# Patient Record
Sex: Female | Born: 1980 | Hispanic: Yes | Marital: Married
Health system: Midwestern US, Community
[De-identification: ages and names within clinical notes are randomized; demographics above are authoritative.]

## PROBLEM LIST (undated history)

## (undated) DIAGNOSIS — R109 Unspecified abdominal pain: Principal | ICD-10-CM

## (undated) DIAGNOSIS — G8929 Other chronic pain: Secondary | ICD-10-CM

## (undated) DIAGNOSIS — N3000 Acute cystitis without hematuria: Secondary | ICD-10-CM

## (undated) DIAGNOSIS — Z0000 Encounter for general adult medical examination without abnormal findings: Secondary | ICD-10-CM

## (undated) DIAGNOSIS — K3184 Gastroparesis: Principal | ICD-10-CM

## (undated) DIAGNOSIS — I120 Hypertensive chronic kidney disease with stage 5 chronic kidney disease or end stage renal disease: Secondary | ICD-10-CM

## (undated) DIAGNOSIS — F329 Major depressive disorder, single episode, unspecified: Secondary | ICD-10-CM

## (undated) DIAGNOSIS — E7800 Pure hypercholesterolemia, unspecified: Secondary | ICD-10-CM

## (undated) DIAGNOSIS — F32A Depression, unspecified: Secondary | ICD-10-CM

## (undated) DIAGNOSIS — E119 Type 2 diabetes mellitus without complications: Secondary | ICD-10-CM

## (undated) DIAGNOSIS — I10 Essential (primary) hypertension: Secondary | ICD-10-CM

## (undated) DIAGNOSIS — M109 Gout, unspecified: Secondary | ICD-10-CM

## (undated) DIAGNOSIS — E785 Hyperlipidemia, unspecified: Secondary | ICD-10-CM

## (undated) DIAGNOSIS — R519 Headache, unspecified: Secondary | ICD-10-CM

## (undated) DIAGNOSIS — H539 Unspecified visual disturbance: Secondary | ICD-10-CM

---

## 2003-01-12 ENCOUNTER — Emergency Department: Admit: 2003-01-12 | Payer: Self-pay | Source: Emergency Department | Admitting: Emergency Medicine

## 2003-11-27 ENCOUNTER — Inpatient Hospital Stay (INDEPENDENT_AMBULATORY_CARE_PROVIDER_SITE_OTHER): Admit: 2003-11-27 | Disposition: A | Discharge status: Home / Self Care | Payer: Self-pay | Source: Ambulatory Visit

## 2004-03-04 ENCOUNTER — Inpatient Hospital Stay (HOSPITAL_BASED_OUTPATIENT_CLINIC_OR_DEPARTMENT_OTHER)
Admission: RE | Admit: 2004-03-04 | Disposition: A | Discharge status: Home / Self Care | Payer: Self-pay | Source: Ambulatory Visit

## 2005-11-03 ENCOUNTER — Emergency Department: Admit: 2005-11-03 | Payer: Self-pay | Source: Emergency Department

## 2005-11-03 LAB — URINALYSIS WITH MICROSCOPIC
Bilirubin, UA: NEGATIVE
Glucose, UA: NEGATIVE
Ketones UA: NEGATIVE
Leukocyte Esterase, UA: NEGATIVE
Nitrite, UA: NEGATIVE
Specific Gravity UA POCT: 1.029 (ref 1.001–1.035)
Urine pH: 6.5 (ref 5.0–8.0)
Urobilinogen, UA: 0.2

## 2005-11-03 LAB — CBC WITH AUTO DIFFERENTIAL CERNER
Basophils Absolute: 0 /mm3 (ref 0.0–0.2)
Basophils: 1 % (ref 0–2)
Eosinophils Absolute: 0.1 /mm3 (ref 0.0–0.7)
Eosinophils: 1 % (ref 0–5)
Granulocytes Absolute: 7.2 /mm3 (ref 1.8–8.1)
Hematocrit: 37.5 % (ref 37.0–47.0)
Hgb: 13.2 G/DL (ref 12.0–16.0)
Lymphocytes Absolute: 1.2 /mm3 (ref 0.5–4.4)
Lymphocytes: 13 % — ABNORMAL LOW (ref 15–41)
MCH: 29.6 PG (ref 28.0–32.0)
MCHC: 35.1 G/DL (ref 32.0–36.0)
MCV: 84.2 FL (ref 80.0–100.0)
MPV: 6.8 FL — ABNORMAL LOW (ref 7.4–10.4)
Monocytes Absolute: 0.8 /mm3 (ref 0.0–1.2)
Monocytes: 8 % (ref 0–11)
Neutrophils %: 78 % — ABNORMAL HIGH (ref 52–75)
Platelets: 330 /mm3 (ref 140–400)
RBC: 4.45 /mm3 (ref 4.20–5.40)
RDW: 13.2 % (ref 11.5–15.0)
WBC: 9.3 /mm3 (ref 3.5–10.8)

## 2005-11-03 LAB — HCG QUANTITATIVE LEVEL - FH CERNER: TUMOR MARKER BHCG QUANT: 148725 m[IU]/mL

## 2005-11-03 LAB — ABO/RH: ABO Rh: O POS

## 2005-12-15 ENCOUNTER — Emergency Department: Admit: 2005-12-15 | Payer: Self-pay | Source: Emergency Department | Admitting: Emergency Medicine

## 2005-12-15 ENCOUNTER — Inpatient Hospital Stay (INDEPENDENT_AMBULATORY_CARE_PROVIDER_SITE_OTHER): Admit: 2005-12-15 | Disposition: A | Discharge status: Home / Self Care | Payer: Self-pay | Source: Ambulatory Visit

## 2005-12-15 LAB — CBC WITH MANUAL DIFF- CERNER
Bands: 1 % (ref 0–9)
Eosinophils %: 3 % (ref 0–5)
Hematocrit: 37.2 % (ref 37.0–47.0)
Hgb: 12.9 G/DL (ref 12.0–16.0)
Lymphocytes Manual: 22 % (ref 15–41)
MCH: 29.6 PG (ref 28.0–32.0)
MCHC: 34.7 G/DL (ref 32.0–36.0)
MCV: 85.3 FL (ref 80.0–100.0)
MPV: 7.5 FL (ref 7.4–10.4)
Monocytes Manual: 5 % (ref 0–11)
Neutrophils %: 69 % (ref 52–75)
Platelets: 371 /mm3 (ref 140–400)
RBC Morphology: NORMAL
RBC: 4.36 /mm3 (ref 4.20–5.40)
RDW: 13.6 % (ref 11.5–15.0)
WBC: 11.9 /mm3 — ABNORMAL HIGH (ref 3.5–10.8)

## 2005-12-15 LAB — CBC WITH AUTO DIFFERENTIAL CERNER
Basophils Absolute: 0.1 /mm3 (ref 0.0–0.2)
Basophils: 1 % (ref 0–2)
Eosinophils Absolute: 0.5 /mm3 (ref 0.0–0.7)
Eosinophils: 4 % (ref 0–5)
Granulocytes Absolute: 9.1 /mm3 — ABNORMAL HIGH (ref 1.8–8.1)
Hematocrit: 36.5 % — ABNORMAL LOW (ref 37.0–47.0)
Hgb: 12.6 G/DL (ref 12.0–16.0)
Lymphocytes Absolute: 2.7 /mm3 (ref 0.5–4.4)
Lymphocytes: 21 % (ref 15–41)
MCH: 29.4 PG (ref 28.0–32.0)
MCHC: 34.5 G/DL (ref 32.0–36.0)
MCV: 85.2 FL (ref 80.0–100.0)
MPV: 7.3 FL — ABNORMAL LOW (ref 7.4–10.4)
Monocytes Absolute: 0.6 /mm3 (ref 0.0–1.2)
Monocytes: 5 % (ref 0–11)
Neutrophils %: 70 % (ref 52–75)
Platelets: 350 /mm3 (ref 140–400)
RBC: 4.28 /mm3 (ref 4.20–5.40)
RDW: 13.2 % (ref 11.5–15.0)
WBC: 13.1 /mm3 — ABNORMAL HIGH (ref 3.5–10.8)

## 2005-12-15 LAB — ALT: ALT: 32 U/L (ref 3–36)

## 2005-12-15 LAB — URINALYSIS
Blood, UA: NEGATIVE
Glucose, UA: NEGATIVE
Ketones UA: NEGATIVE
Leukocyte Esterase, UA: NEGATIVE
Nitrite, UA: NEGATIVE
Protein, UR: 100 — AB
Specific Gravity UA POCT: 1.033 (ref 1.001–1.035)
Urine pH: 5 (ref 5.0–8.0)
Urobilinogen, UA: 1 EU/dL (ref 0.2–1.0)

## 2005-12-15 LAB — BASIC METABOLIC PANEL
BUN: 7 mg/dL — ABNORMAL LOW (ref 8–20)
BUN: 7 mg/dL — ABNORMAL LOW (ref 8–20)
CO2: 25 mEq/L (ref 21–30)
CO2: 20 mEq/L — ABNORMAL LOW (ref 21–30)
Calcium: 9.2 mg/dL (ref 8.6–10.2)
Calcium: 9.4 mg/dL (ref 8.6–10.2)
Chloride: 103 mEq/L (ref 98–107)
Chloride: 105 mEq/L (ref 98–107)
Creatinine: 0.4 mg/dL — ABNORMAL LOW (ref 0.6–1.5)
Creatinine: 0.4 mg/dL — ABNORMAL LOW (ref 0.6–1.5)
Glucose: 87 mg/dL (ref 70–100)
Glucose: 103 mg/dL — ABNORMAL HIGH (ref 70–100)
Potassium: 3.8 mEq/L (ref 3.6–5.0)
Potassium: 3.6 mEq/L (ref 3.6–5.0)
Sodium: 138 mEq/L (ref 136–146)
Sodium: 137 mEq/L (ref 136–146)

## 2005-12-15 LAB — HCG QUANTITATIVE LEVEL - FH CERNER: TUMOR MARKER BHCG QUANT: 44954 m[IU]/mL

## 2005-12-15 LAB — TSH: TSH: 0.396 u[IU]/mL — ABNORMAL LOW (ref 0.465–4.680)

## 2005-12-15 LAB — URINE ICTOTEST: Urine Ictotest: NEGATIVE

## 2005-12-15 LAB — MAGNESIUM: Magnesium: 1.6 mg/dL (ref 1.6–2.3)

## 2005-12-15 LAB — AST: AST (SGOT): 25 U/L (ref 10–41)

## 2005-12-15 LAB — LACTATE DEHYDROGENASE: LDH: 418 U/L (ref 307–575)

## 2005-12-15 LAB — GFR

## 2005-12-15 LAB — URIC ACID: Uric acid: 5.3 mg/dL (ref 2.3–6.1)

## 2006-02-22 LAB — GLUCOSE CHALLENGE: Glucose Challenge: 100 mg/dL

## 2006-03-09 LAB — T4, FREE: T4 Free: 0.77 ng/dL — ABNORMAL LOW (ref <=1.85)

## 2006-03-11 LAB — CREATININE CLEARANCE
Body Surface Area:: 1.67
CC Total Vol: 850 ML
Creat Clearance Hours Collected: 24 HOURS
Creat Clearance-Serum Creatinine: 170.1 mg/dL
Urine Creat Clearance Calculation: 260 mL/min — ABNORMAL HIGH (ref 70–130)

## 2006-03-11 LAB — PROTEIN, URINE, 24 HOUR
Total Volume 24Hr: 850 mL
UR Protein 24Hr: 111 mg/(24.h) (ref 5–160)
UR Total Protein: 13 mg/dL

## 2006-04-05 ENCOUNTER — Emergency Department: Admit: 2006-04-05 | Payer: Self-pay | Source: Emergency Department | Admitting: Emergency Medicine

## 2006-04-18 ENCOUNTER — Observation Stay (HOSPITAL_BASED_OUTPATIENT_CLINIC_OR_DEPARTMENT_OTHER)
Admission: RE | Admit: 2006-04-18 | Disposition: A | Discharge status: Home / Self Care | Payer: Self-pay | Source: Ambulatory Visit

## 2006-04-18 LAB — URINALYSIS WITH MICROSCOPIC
Bilirubin, UA: NEGATIVE
Blood, UA: NEGATIVE
Glucose, UA: 500
Ketones UA: NEGATIVE
Leukocyte Esterase, UA: NEGATIVE
Nitrite, UA: NEGATIVE
Protein, UR: NEGATIVE
Specific Gravity UA POCT: 1.033 (ref 1.001–1.035)
Urine pH: 6 (ref 5.0–8.0)
Urobilinogen, UA: 1 EU/dL (ref 0.2–1.0)
WBC, UA: 12 /HPF — ABNORMAL HIGH (ref 0–5)

## 2006-05-10 LAB — CBC WITH AUTO DIFFERENTIAL CERNER
Basophils Absolute: 0 /mm3 (ref 0.0–0.2)
Basophils: 1 % (ref 0–2)
Eosinophils Absolute: 0.2 /mm3 (ref 0.0–0.7)
Eosinophils: 3 % (ref 0–5)
Granulocytes Absolute: 5.4 /mm3 (ref 1.8–8.1)
Hematocrit: 32.2 % — ABNORMAL LOW (ref 37.0–47.0)
Hgb: 10.9 G/DL — ABNORMAL LOW (ref 12.0–16.0)
Lymphocytes Absolute: 2 /mm3 (ref 0.5–4.4)
Lymphocytes: 24 % (ref 15–41)
MCH: 27.9 PG — ABNORMAL LOW (ref 28.0–32.0)
MCHC: 34 G/DL (ref 32.0–36.0)
MCV: 81.9 FL (ref 80.0–100.0)
MPV: 7.6 FL (ref 7.4–10.4)
Monocytes Absolute: 0.7 /mm3 (ref 0.0–1.2)
Monocytes: 9 % (ref 0–11)
Neutrophils %: 64 % (ref 52–75)
Platelets: 354 /mm3 (ref 140–400)
RBC: 3.93 /mm3 — ABNORMAL LOW (ref 4.20–5.40)
RDW: 13.6 % (ref 11.5–15.0)
WBC: 8.4 /mm3 (ref 3.5–10.8)

## 2006-05-10 LAB — AST: AST (SGOT): 16 U/L (ref 10–41)

## 2006-05-10 LAB — GFR

## 2006-05-10 LAB — URINALYSIS
Bilirubin, UA: NEGATIVE
Blood, UA: NEGATIVE
Glucose, UA: NEGATIVE
Ketones UA: NEGATIVE
Nitrite, UA: NEGATIVE
Protein, UR: NEGATIVE
Specific Gravity UA POCT: 1.023 (ref 1.001–1.035)
Urine pH: 5 (ref 5.0–8.0)
Urobilinogen, UA: 0.2 EU/dL (ref 0.2–1.0)

## 2006-05-10 LAB — URIC ACID: Uric acid: 4.1 mg/dL (ref 2.3–6.1)

## 2006-05-10 LAB — CREATININE, SERUM: Creatinine: 0.5 mg/dL — ABNORMAL LOW (ref 0.6–1.5)

## 2006-05-10 LAB — ALT: ALT: 13 U/L (ref 3–36)

## 2006-05-10 LAB — LACTATE DEHYDROGENASE: LDH: 414 U/L (ref 307–575)

## 2006-05-27 LAB — GFR

## 2006-05-27 LAB — URINALYSIS
Bilirubin, UA: NEGATIVE
Blood, UA: NEGATIVE
Glucose, UA: NEGATIVE
Ketones UA: NEGATIVE
Leukocyte Esterase, UA: NEGATIVE
Nitrite, UA: NEGATIVE
Protein, UR: NEGATIVE
Specific Gravity UA POCT: 1.014 (ref 1.001–1.035)
Urine pH: 5.5 (ref 5.0–8.0)
Urobilinogen, UA: 0.2 EU/dL (ref 0.2–1.0)

## 2006-05-27 LAB — CBC- CERNER
Hematocrit: 33.6 % — ABNORMAL LOW (ref 37.0–47.0)
Hgb: 11.3 G/DL — ABNORMAL LOW (ref 12.0–16.0)
MCH: 26.7 PG — ABNORMAL LOW (ref 28.0–32.0)
MCHC: 33.7 G/DL (ref 32.0–36.0)
MCV: 79.3 FL — ABNORMAL LOW (ref 80.0–100.0)
MPV: 8.2 FL (ref 7.4–10.4)
Platelets: 336 /mm3 (ref 140–400)
RBC: 4.24 /mm3 (ref 4.20–5.40)
RDW: 14 % (ref 11.5–15.0)
WBC: 9.1 /mm3 (ref 3.5–10.8)

## 2006-05-27 LAB — URIC ACID: Uric acid: 5.8 mg/dL (ref 2.3–6.1)

## 2006-05-27 LAB — LACTATE DEHYDROGENASE: LDH: 466 U/L (ref 307–575)

## 2006-05-27 LAB — AST: AST (SGOT): 21 U/L (ref 10–41)

## 2006-05-27 LAB — ALT: ALT: 8 U/L (ref 3–36)

## 2006-05-27 LAB — CREATININE, SERUM: Creatinine: 0.6 mg/dL (ref 0.6–1.5)

## 2006-06-02 ENCOUNTER — Inpatient Hospital Stay (HOSPITAL_BASED_OUTPATIENT_CLINIC_OR_DEPARTMENT_OTHER)
Admission: RE | Admit: 2006-06-02 | Disposition: A | Discharge status: Home / Self Care | Payer: Self-pay | Source: Ambulatory Visit

## 2006-06-02 LAB — CBC- CERNER
Hematocrit: 35.3 % — ABNORMAL LOW (ref 37.0–47.0)
Hematocrit: 32.4 % — ABNORMAL LOW (ref 37.0–47.0)
Hgb: 11.7 G/DL — ABNORMAL LOW (ref 12.0–16.0)
Hgb: 10.9 G/DL — ABNORMAL LOW (ref 12.0–16.0)
MCH: 26.3 PG — ABNORMAL LOW (ref 28.0–32.0)
MCH: 26.4 PG — ABNORMAL LOW (ref 28.0–32.0)
MCHC: 33.2 G/DL (ref 32.0–36.0)
MCHC: 33.6 G/DL (ref 32.0–36.0)
MCV: 79.4 FL — ABNORMAL LOW (ref 80.0–100.0)
MCV: 78.5 FL — ABNORMAL LOW (ref 80.0–100.0)
MPV: 8.4 FL (ref 7.4–10.4)
MPV: 8.2 FL (ref 7.4–10.4)
Platelets: 319 /mm3 (ref 140–400)
Platelets: 305 /mm3 (ref 140–400)
RBC: 4.45 /mm3 (ref 4.20–5.40)
RBC: 4.12 /mm3 — ABNORMAL LOW (ref 4.20–5.40)
RDW: 14.6 % (ref 11.5–15.0)
RDW: 14.1 % (ref 11.5–15.0)
WBC: 9.5 /mm3 (ref 3.5–10.8)
WBC: 9 /mm3 (ref 3.5–10.8)

## 2006-06-02 LAB — URINALYSIS
Bilirubin, UA: NEGATIVE
Blood, UA: NEGATIVE
Glucose, UA: NEGATIVE
Ketones UA: NEGATIVE
Leukocyte Esterase, UA: NEGATIVE
Nitrite, UA: NEGATIVE
Protein, UR: NEGATIVE
Specific Gravity UA POCT: 1.016 (ref 1.001–1.035)
Urine pH: 7 (ref 5.0–8.0)
Urobilinogen, UA: 0.2 EU/dL (ref 0.2–1.0)

## 2006-06-02 LAB — URIC ACID: Uric acid: 5.9 mg/dL (ref 2.3–6.1)

## 2006-06-02 LAB — GFR

## 2006-06-02 LAB — ALT: ALT: 26 U/L (ref 3–36)

## 2006-06-02 LAB — AST: AST (SGOT): 21 U/L (ref 10–41)

## 2006-06-02 LAB — LACTATE DEHYDROGENASE: LDH: 491 U/L (ref 307–575)

## 2006-06-02 LAB — CREATININE, SERUM: Creatinine: 0.6 mg/dL (ref 0.6–1.5)

## 2006-06-03 LAB — CBC- CERNER
Hematocrit: 33 % — ABNORMAL LOW (ref 37.0–47.0)
Hgb: 11.3 G/DL — ABNORMAL LOW (ref 12.0–16.0)
MCH: 26.6 PG — ABNORMAL LOW (ref 28.0–32.0)
MCHC: 34.1 G/DL (ref 32.0–36.0)
MCV: 77.9 FL — ABNORMAL LOW (ref 80.0–100.0)
MPV: 8 FL (ref 7.4–10.4)
Platelets: 302 /mm3 (ref 140–400)
RBC: 4.24 /mm3 (ref 4.20–5.40)
RDW: 14.3 % (ref 11.5–15.0)
WBC: 14.8 /mm3 — ABNORMAL HIGH (ref 3.5–10.8)

## 2006-06-03 LAB — ALT: ALT: 20 U/L (ref 3–36)

## 2006-06-03 LAB — AST: AST (SGOT): 23 U/L (ref 10–41)

## 2006-06-03 LAB — LACTATE DEHYDROGENASE: LDH: 581 U/L — ABNORMAL HIGH (ref 307–575)

## 2006-06-03 LAB — GFR

## 2006-06-03 LAB — URIC ACID: Uric acid: 7 mg/dL — ABNORMAL HIGH (ref 2.3–6.1)

## 2006-06-03 LAB — CREATININE, SERUM: Creatinine: 0.6 mg/dL (ref 0.6–1.5)

## 2006-06-04 LAB — CBC- CERNER
Hematocrit: 28 % — ABNORMAL LOW (ref 37.0–47.0)
Hgb: 9.4 G/DL — ABNORMAL LOW (ref 12.0–16.0)
MCH: 26.3 PG — ABNORMAL LOW (ref 28.0–32.0)
MCHC: 33.4 G/DL (ref 32.0–36.0)
MCV: 78.7 FL — ABNORMAL LOW (ref 80.0–100.0)
MPV: 8.1 FL (ref 7.4–10.4)
Platelets: 259 /mm3 (ref 140–400)
RBC: 3.57 /mm3 — ABNORMAL LOW (ref 4.20–5.40)
RDW: 14.2 % (ref 11.5–15.0)
WBC: 14.5 /mm3 — ABNORMAL HIGH (ref 3.5–10.8)

## 2006-06-04 LAB — URIC ACID: Uric acid: 7.3 mg/dL — ABNORMAL HIGH (ref 2.3–6.1)

## 2006-06-04 LAB — ALT: ALT: 18 U/L (ref 3–36)

## 2006-06-04 LAB — GFR

## 2006-06-04 LAB — AST: AST (SGOT): 23 U/L (ref 10–41)

## 2006-06-04 LAB — CREATININE, SERUM: Creatinine: 0.6 mg/dL (ref 0.6–1.5)

## 2006-06-04 LAB — LACTATE DEHYDROGENASE: LDH: 732 U/L — ABNORMAL HIGH (ref 307–575)

## 2010-06-09 ENCOUNTER — Ambulatory Visit
Admit: 2010-06-09 | Disposition: A | Discharge status: Home / Self Care | Payer: Self-pay | Source: Ambulatory Visit | Admitting: Family Medicine

## 2010-10-08 ENCOUNTER — Emergency Department: Admit: 2010-10-08 | Payer: Self-pay | Source: Emergency Department | Admitting: Internal Medicine

## 2010-10-08 LAB — COMPREHENSIVE METABOLIC PANEL
ALT: 10 U/L (ref 0–55)
AST (SGOT): 10 U/L (ref 5–34)
Albumin/Globulin Ratio: 1.2 (ref 0.9–2.2)
Albumin: 3.8 g/dL (ref 3.5–5.0)
Alkaline Phosphatase: 91 U/L (ref 40–150)
Anion Gap: 13 (ref 5.0–15.0)
BUN: 11 mg/dL (ref 7.0–19.0)
Bilirubin, Total: 0.3 mg/dL (ref 0.2–1.2)
CO2: 20 mEq/L — ABNORMAL LOW (ref 22–29)
Calcium: 9.3 mg/dL (ref 8.5–10.5)
Chloride: 104 mEq/L (ref 98–107)
Creatinine: 0.6 mg/dL (ref 0.6–1.0)
Globulin: 3.3 g/dL (ref 2.0–3.6)
Glucose: 232 mg/dL — ABNORMAL HIGH (ref 70–100)
Potassium: 3.8 mEq/L (ref 3.5–5.1)
Protein, Total: 7.1 g/dL (ref 6.0–8.3)
Sodium: 137 mEq/L (ref 136–145)

## 2010-10-08 LAB — CBC AND DIFFERENTIAL
Baso(Absolute): 0.04 10*3/uL (ref 0.00–0.20)
Basophils: 0 % (ref 0–2)
Eosinophils Absolute: 0.16 10*3/uL (ref 0.00–0.70)
Eosinophils: 2 % (ref 0–5)
Hematocrit: 36.8 % — ABNORMAL LOW (ref 37.0–47.0)
Hgb: 12.5 g/dL (ref 12.0–16.0)
Immature Granulocytes Absolute: 0.03 10*3/uL
Immature Granulocytes: 0 % (ref 0–1)
Lymphocytes Absolute: 2.37 10*3/uL (ref 0.50–4.40)
Lymphocytes: 28 % (ref 15–41)
MCH: 26.9 pg — ABNORMAL LOW (ref 28.0–32.0)
MCHC: 34 g/dL (ref 32.0–36.0)
MCV: 79.1 fL — ABNORMAL LOW (ref 80.0–100.0)
MPV: 8.9 fL — ABNORMAL LOW (ref 9.4–12.3)
Monocytes Absolute: 0.44 10*3/uL (ref 0.00–1.20)
Monocytes: 5 % (ref 0–11)
Neutrophils Absolute: 5.34 10*3/uL
Neutrophils: 64 % (ref 52–75)
Platelets: 372 10*3/uL (ref 140–400)
RBC: 4.65 10*6/uL (ref 4.20–5.40)
RDW: 13 % (ref 12–15)
WBC: 8.35 10*3/uL (ref 3.50–10.80)

## 2010-10-08 LAB — URINALYSIS, REFLEX TO MICROSCOPIC EXAM IF INDICATED
Bilirubin, UA: NEGATIVE
Glucose, UA: 500 — AB
Ketones UA: NEGATIVE
Nitrite, UA: POSITIVE — AB
Protein, UR: NEGATIVE
RBC, UA: 3 /HPF (ref 0–5)
Specific Gravity UA POCT: 1.01 (ref 1.001–1.035)
Squamous Epithelial Cells, Urine: 2 /HPF (ref 0–25)
Urine pH: 5 (ref 5.0–8.0)
Urobilinogen, UA: NORMAL mg/dL
WBC, UA: 10 /HPF (ref 0–5)

## 2010-10-08 LAB — HEMOLYSIS INDEX: Hemolysis Index: 12 Index (ref 0–18)

## 2010-10-08 LAB — GFR: EGFR: >60

## 2010-10-14 ENCOUNTER — Emergency Department: Admit: 2010-10-14 | Payer: Self-pay | Source: Emergency Department | Admitting: Emergency Medicine

## 2010-10-14 LAB — CBC AND DIFFERENTIAL
Baso(Absolute): 0.04 10*3/uL (ref 0.00–0.20)
Basophils: 0 % (ref 0–2)
Eosinophils Absolute: 0.3 10*3/uL (ref 0.00–0.70)
Eosinophils: 4 % (ref 0–5)
Hematocrit: 37.4 % (ref 37.0–47.0)
Hgb: 12.7 g/dL (ref 12.0–16.0)
Immature Granulocytes Absolute: 0.03 10*3/uL
Immature Granulocytes: 0 % (ref 0–1)
Lymphocytes Absolute: 3 10*3/uL (ref 0.50–4.40)
Lymphocytes: 38 % (ref 15–41)
MCH: 26.7 pg — ABNORMAL LOW (ref 28.0–32.0)
MCHC: 34 g/dL (ref 32.0–36.0)
MCV: 78.6 fL — ABNORMAL LOW (ref 80.0–100.0)
MPV: 8.9 fL — ABNORMAL LOW (ref 9.4–12.3)
Monocytes Absolute: 0.53 10*3/uL (ref 0.00–1.20)
Monocytes: 7 % (ref 0–11)
Neutrophils Absolute: 4.14 10*3/uL
Neutrophils: 52 % (ref 52–75)
Platelets: 403 10*3/uL — ABNORMAL HIGH (ref 140–400)
RBC: 4.76 10*6/uL (ref 4.20–5.40)
RDW: 13 % (ref 12–15)
WBC: 8.01 10*3/uL (ref 3.50–10.80)

## 2010-10-14 LAB — URINALYSIS, REFLEX TO MICROSCOPIC EXAM IF INDICATED
Bilirubin, UA: NEGATIVE
Glucose, UA: 150 — AB
Ketones UA: NEGATIVE
Nitrite, UA: POSITIVE — AB
Protein, UR: 30 — AB
RBC, UA: 3 /HPF (ref 0–5)
Specific Gravity UA POCT: 1.025 (ref 1.001–1.035)
Squamous Epithelial Cells, Urine: 12 /HPF (ref 0–25)
Urine pH: 6 (ref 5.0–8.0)
Urobilinogen, UA: NORMAL mg/dL
WBC, UA: 33 /HPF (ref 0–5)

## 2010-10-14 LAB — BASIC METABOLIC PANEL
Anion Gap: 12 (ref 5.0–15.0)
BUN: 11 mg/dL (ref 7.0–19.0)
CO2: 20 mEq/L — ABNORMAL LOW (ref 22–29)
Calcium: 9.2 mg/dL (ref 8.5–10.5)
Chloride: 106 mEq/L (ref 98–107)
Creatinine: 0.6 mg/dL (ref 0.6–1.0)
Glucose: 165 mg/dL — ABNORMAL HIGH (ref 70–100)
Potassium: 3.9 mEq/L (ref 3.5–5.1)
Sodium: 138 mEq/L (ref 136–145)

## 2010-10-14 LAB — GFR: EGFR: >60

## 2010-10-14 LAB — HEMOLYSIS INDEX: Hemolysis Index: 6 Index (ref 0–18)

## 2010-11-28 ENCOUNTER — Emergency Department: Admit: 2010-11-28 | Payer: Self-pay | Source: Emergency Department | Admitting: Emergency Medicine

## 2010-11-28 LAB — CBC AND DIFFERENTIAL
Basophils Absolute Automated: 0.05 10*3/uL (ref 0.00–0.20)
Basophils Automated: 1 % (ref 0–2)
Eosinophils Absolute Automated: 0.13 10*3/uL (ref 0.00–0.70)
Eosinophils Automated: 2 % (ref 0–5)
Hematocrit: 34.7 % — ABNORMAL LOW (ref 37.0–47.0)
Hgb: 11.4 g/dL — ABNORMAL LOW (ref 12.0–16.0)
Immature Granulocytes Absolute: 0.01 10*3/uL
Immature Granulocytes: 0 % (ref 0–1)
Lymphocytes Absolute Automated: 3.15 10*3/uL (ref 0.50–4.40)
Lymphocytes Automated: 39 % (ref 15–41)
MCH: 26.4 pg — ABNORMAL LOW (ref 28.0–32.0)
MCHC: 32.9 g/dL (ref 32.0–36.0)
MCV: 80.3 fL (ref 80.0–100.0)
MPV: 8.7 fL — ABNORMAL LOW (ref 9.4–12.3)
Monocytes Absolute Automated: 0.65 10*3/uL (ref 0.00–1.20)
Monocytes: 8 % (ref 0–11)
Neutrophils Absolute: 4.13 10*3/uL (ref 1.80–8.10)
Neutrophils: 51 % — ABNORMAL LOW (ref 52–75)
Nucleated RBC: 0 /100 WBC
Platelets: 412 10*3/uL — ABNORMAL HIGH (ref 140–400)
RBC: 4.32 10*6/uL (ref 4.20–5.40)
RDW: 14 % (ref 12–15)
WBC: 8.11 10*3/uL (ref 3.50–10.80)

## 2010-11-28 LAB — COMPREHENSIVE METABOLIC PANEL
ALT: 6 U/L (ref 0–55)
AST (SGOT): 11 U/L (ref 5–34)
Albumin/Globulin Ratio: 1.4 (ref 0.9–2.2)
Albumin: 4.1 g/dL (ref 3.5–5.0)
Alkaline Phosphatase: 75 U/L (ref 40–150)
Anion Gap: 11 (ref 5.0–15.0)
BUN: 11 mg/dL (ref 7.0–19.0)
Bilirubin, Total: 0.3 mg/dL (ref 0.2–1.2)
CO2: 25 mEq/L (ref 22–29)
Calcium: 9.2 mg/dL (ref 8.5–10.5)
Chloride: 103 mEq/L (ref 98–107)
Creatinine: 0.5 mg/dL — ABNORMAL LOW (ref 0.6–1.0)
Globulin: 3 g/dL (ref 2.0–3.6)
Glucose: 103 mg/dL — ABNORMAL HIGH (ref 70–100)
Potassium: 3.6 mEq/L (ref 3.5–5.1)
Protein, Total: 7.1 g/dL (ref 6.0–8.3)
Sodium: 139 mEq/L (ref 136–145)

## 2010-11-28 LAB — URINALYSIS, REFLEX TO MICROSCOPIC EXAM IF INDICATED
Bilirubin, UA: NEGATIVE
Blood, UA: NEGATIVE
Glucose, UA: NEGATIVE
Ketones UA: NEGATIVE
Leukocyte Esterase, UA: NEGATIVE
Nitrite, UA: NEGATIVE
Protein, UR: NEGATIVE
RBC, UA: 2 /HPF (ref 0–5)
Specific Gravity UA POCT: 1.005 (ref 1.001–1.035)
Squamous Epithelial Cells, Urine: 3 /HPF (ref 0–25)
Urine pH: 8 (ref 5.0–8.0)
Urobilinogen, UA: NORMAL mg/dL

## 2010-11-28 LAB — HEMOLYSIS INDEX: Hemolysis Index: 4 Index (ref 0–18)

## 2010-11-28 LAB — LIPASE: Lipase: 36 U/L (ref 8–78)

## 2010-11-28 LAB — GFR: EGFR: >60

## 2010-12-22 ENCOUNTER — Emergency Department
Admit: 2010-12-22 | Disposition: A | Discharge status: Home / Self Care | Payer: Self-pay | Source: Emergency Department | Admitting: Internal Medicine

## 2010-12-22 LAB — COMPREHENSIVE METABOLIC PANEL
ALT: 10 U/L (ref 0–55)
AST (SGOT): 11 U/L (ref 5–34)
Albumin/Globulin Ratio: 1.1 (ref 0.9–2.2)
Albumin: 3.4 g/dL — ABNORMAL LOW (ref 3.5–5.0)
Alkaline Phosphatase: 64 U/L (ref 40–150)
Anion Gap: 6 (ref 5.0–15.0)
BUN: 10 mg/dL (ref 7.0–19.0)
Bilirubin, Total: 0.3 mg/dL (ref 0.2–1.2)
CO2: 24 mEq/L (ref 22–29)
Calcium: 8.7 mg/dL (ref 8.5–10.5)
Chloride: 106 mEq/L (ref 98–107)
Creatinine: 0.6 mg/dL (ref 0.6–1.0)
Globulin: 3.1 g/dL (ref 2.0–3.6)
Glucose: 146 mg/dL — ABNORMAL HIGH (ref 70–100)
Potassium: 3.7 mEq/L (ref 3.5–5.1)
Protein, Total: 6.5 g/dL (ref 6.0–8.3)
Sodium: 136 mEq/L (ref 136–145)

## 2010-12-22 LAB — URINALYSIS, REFLEX TO MICROSCOPIC EXAM IF INDICATED
Bilirubin, UA: NEGATIVE
Blood, UA: NEGATIVE
Glucose, UA: 150 — AB
Ketones UA: NEGATIVE
Leukocyte Esterase, UA: NEGATIVE
Nitrite, UA: NEGATIVE
Protein, UR: NEGATIVE
RBC, UA: <1 /HPF (ref 0–5)
Specific Gravity UA POCT: 1.015 (ref 1.001–1.035)
Squamous Epithelial Cells, Urine: 2 /HPF (ref 0–25)
Urine pH: 6 (ref 5.0–8.0)
Urobilinogen, UA: NORMAL mg/dL
WBC, UA: <1 /HPF (ref 0–5)

## 2010-12-22 LAB — GFR: EGFR: >60

## 2010-12-22 LAB — CBC AND DIFFERENTIAL
Basophils Absolute Automated: 0.03 10*3/uL (ref 0.00–0.20)
Basophils Automated: 0 % (ref 0–2)
Eosinophils Absolute Automated: 0.11 10*3/uL (ref 0.00–0.70)
Eosinophils Automated: 2 % (ref 0–5)
Hematocrit: 33.9 % — ABNORMAL LOW (ref 37.0–47.0)
Hgb: 11 g/dL — ABNORMAL LOW (ref 12.0–16.0)
Immature Granulocytes Absolute: 0 10*3/uL
Immature Granulocytes: 0 % (ref 0–1)
Lymphocytes Absolute Automated: 1.8 10*3/uL (ref 0.50–4.40)
Lymphocytes Automated: 31 % (ref 15–41)
MCH: 25.9 pg — ABNORMAL LOW (ref 28.0–32.0)
MCHC: 32.4 g/dL (ref 32.0–36.0)
MCV: 79.8 fL — ABNORMAL LOW (ref 80.0–100.0)
MPV: 8.6 fL — ABNORMAL LOW (ref 9.4–12.3)
Monocytes Absolute Automated: 0.51 10*3/uL (ref 0.00–1.20)
Monocytes: 9 % (ref 0–11)
Neutrophils Absolute: 3.3 10*3/uL (ref 1.80–8.10)
Neutrophils: 57 % (ref 52–75)
Nucleated RBC: 0 /100 WBC
Platelets: 354 10*3/uL (ref 140–400)
RBC: 4.25 10*6/uL (ref 4.20–5.40)
RDW: 14 % (ref 12–15)
WBC: 5.75 10*3/uL (ref 3.50–10.80)

## 2010-12-22 LAB — TYPE AND SCREEN
AB Screen Gel: NEGATIVE
ABO Rh: O POS

## 2010-12-22 LAB — HCG QUANTITATIVE: hCG, Quant.: 9567.4 m[IU]/mL

## 2010-12-22 LAB — HEMOLYSIS INDEX: Hemolysis Index: 3 Index (ref 0–18)

## 2010-12-23 ENCOUNTER — Ambulatory Visit (INDEPENDENT_AMBULATORY_CARE_PROVIDER_SITE_OTHER)
Admit: 2010-12-23 | Disposition: A | Discharge status: Home / Self Care | Payer: Self-pay | Source: Ambulatory Visit | Attending: Emergency Medicine | Admitting: Emergency Medicine

## 2010-12-23 LAB — HCG QUANTITATIVE: hCG, Quant.: 13998.8 m[IU]/mL — ABNORMAL HIGH (ref 0.0–4.9)

## 2010-12-25 LAB — HCG QUANTITATIVE: hCG, Quant.: 23760.2 m[IU]/mL — ABNORMAL HIGH (ref 0.0–4.9)

## 2010-12-30 ENCOUNTER — Ambulatory Visit
Admit: 2010-12-30 | Disposition: A | Discharge status: Home / Self Care | Payer: Self-pay | Source: Ambulatory Visit | Attending: Emergency Medicine | Admitting: Emergency Medicine

## 2011-01-09 ENCOUNTER — Emergency Department
Admit: 2011-01-09 | Disposition: A | Discharge status: Home / Self Care | Payer: Self-pay | Source: Emergency Department | Admitting: Emergency Medicine

## 2011-01-22 ENCOUNTER — Ambulatory Visit (INDEPENDENT_AMBULATORY_CARE_PROVIDER_SITE_OTHER): Payer: Self-pay | Admitting: Maternal & Fetal Medicine

## 2011-01-22 ENCOUNTER — Ambulatory Visit: Payer: Self-pay

## 2011-01-22 ENCOUNTER — Encounter (INDEPENDENT_AMBULATORY_CARE_PROVIDER_SITE_OTHER): Payer: Self-pay | Admitting: Maternal & Fetal Medicine

## 2011-01-22 VITALS — BP 126/82 | Ht 59.0 in | Wt 113.7 lb

## 2011-01-22 DIAGNOSIS — Z98891 History of uterine scar from previous surgery: Secondary | ICD-10-CM

## 2011-01-22 DIAGNOSIS — Z3480 Encounter for supervision of other normal pregnancy, unspecified trimester: Secondary | ICD-10-CM

## 2011-01-22 DIAGNOSIS — Z9889 Other specified postprocedural states: Secondary | ICD-10-CM

## 2011-01-22 DIAGNOSIS — O24919 Unspecified diabetes mellitus in pregnancy, unspecified trimester: Secondary | ICD-10-CM

## 2011-01-22 DIAGNOSIS — IMO0002 Reserved for concepts with insufficient information to code with codable children: Secondary | ICD-10-CM

## 2011-01-22 LAB — POCT GLUCOSE, URINE, QUALITATIVE, DIPSTICK: Glucose, UA: NEGATIVE

## 2011-01-22 LAB — PRENATAL  WORKUP
AB Screen Gel: NEGATIVE
ABO Rh: O POS

## 2011-01-22 LAB — POCT PROTEIN, URINE, QUALITATIVE, DIPSTICK

## 2011-01-22 LAB — HEPATITIS B SURFACE ANTIGEN W/ REFLEX TO CONFIRMATION: Hepatitis B Surface Antigen: NONREACTIVE

## 2011-01-22 NOTE — Progress Notes (Signed)
Pregnancy handbook, community resources packet and schedule of classes given and discussed with  patient. Teaching CHECKLIST addressed  in EPIC. Kick count explained. Appointment card details discussed with patient. Patient verbalized understanding of teaching materials.Marland Kitchen Kelsey Miles L  May take extra strength 2 tabs q 6 hours for Head aches per DR Minna Antis.

## 2011-01-22 NOTE — Progress Notes (Signed)
Patient already had diabetic teaching but did nor bring blood sugar log will bring next visit

## 2011-01-22 NOTE — Patient Instructions (Signed)
To bring blood sugar log next visit

## 2011-01-23 LAB — RUBELLA ANTIBODY, IGG: Rubella AB, IgG: 139.7

## 2011-01-23 LAB — RPR: RPR: NONREACTIVE

## 2011-01-24 LAB — URINE CULTURE

## 2011-01-25 LAB — CREATININE CLEARANCE
Body Surface Area:: 1.42
Creat Clearance Hours Collected: 24 Hr
Creat Clearance-Serum Creatinine: 0.5 mg/dL
Patient Height (In): 58 [in_us]
Urine Creat Clearance Calculation: 196 mL/min — ABNORMAL HIGH (ref 66–165)
Urine Volume:: 1750 mL
Weight: 113 lb

## 2011-01-25 LAB — CHLAMYDIA/NEISSERIA BY PCR: Chlamydia/GC by PCR: NEGATIVE

## 2011-01-25 LAB — PROTEIN, URINE, 24 HOUR: Protein 24HR, UR: 131.3 mg/24 hr (ref 0.0–299.0)

## 2011-01-26 ENCOUNTER — Other Ambulatory Visit (INDEPENDENT_AMBULATORY_CARE_PROVIDER_SITE_OTHER): Payer: Self-pay | Admitting: Specialist

## 2011-01-26 ENCOUNTER — Emergency Department
Admit: 2011-01-26 | Disposition: A | Discharge status: Home / Self Care | Payer: Self-pay | Source: Emergency Department | Admitting: Emergency Medicine

## 2011-01-26 DIAGNOSIS — N390 Urinary tract infection, site not specified: Secondary | ICD-10-CM

## 2011-01-26 LAB — COMPREHENSIVE METABOLIC PANEL
ALT: 13 U/L (ref 0–55)
AST (SGOT): 14 U/L (ref 5–34)
Albumin/Globulin Ratio: 1 (ref 0.9–2.2)
Albumin: 3.8 g/dL (ref 3.5–5.0)
Alkaline Phosphatase: 58 U/L (ref 40–150)
Anion Gap: 11 (ref 5.0–15.0)
BUN: 8 mg/dL (ref 7.0–19.0)
Bilirubin, Total: 0.5 mg/dL (ref 0.2–1.2)
CO2: 26 mEq/L (ref 22–29)
Calcium: 10 mg/dL (ref 8.5–10.5)
Chloride: 102 mEq/L (ref 98–107)
Creatinine: 0.6 mg/dL (ref 0.6–1.0)
Globulin: 4 g/dL — ABNORMAL HIGH (ref 2.0–3.6)
Glucose: 165 mg/dL — ABNORMAL HIGH (ref 70–100)
Potassium: 3.4 mEq/L — ABNORMAL LOW (ref 3.5–5.1)
Protein, Total: 7.8 g/dL (ref 6.0–8.3)
Sodium: 139 mEq/L (ref 136–145)

## 2011-01-26 LAB — CBC AND DIFFERENTIAL
Basophils Absolute Automated: 0.02 10*3/uL (ref 0.00–0.20)
Basophils Automated: 0 % (ref 0–2)
Eosinophils Absolute Automated: 0.03 10*3/uL (ref 0.00–0.70)
Eosinophils Automated: 0 % (ref 0–5)
Hematocrit: 37.8 % (ref 37.0–47.0)
Hgb: 12.8 g/dL (ref 12.0–16.0)
Immature Granulocytes Absolute: 0.02 10*3/uL
Immature Granulocytes: 0 % (ref 0–1)
Lymphocytes Absolute Automated: 2.1 10*3/uL (ref 0.50–4.40)
Lymphocytes Automated: 17 % (ref 15–41)
MCH: 27.2 pg — ABNORMAL LOW (ref 28.0–32.0)
MCHC: 33.9 g/dL (ref 32.0–36.0)
MCV: 80.3 fL (ref 80.0–100.0)
MPV: 9.3 fL — ABNORMAL LOW (ref 9.4–12.3)
Monocytes Absolute Automated: 0.8 10*3/uL (ref 0.00–1.20)
Monocytes: 7 % (ref 0–11)
Neutrophils Absolute: 9.13 10*3/uL — ABNORMAL HIGH (ref 1.80–8.10)
Neutrophils: 76 % — ABNORMAL HIGH (ref 52–75)
Nucleated RBC: 0 /100 WBC
Platelets: 390 10*3/uL (ref 140–400)
RBC: 4.71 10*6/uL (ref 4.20–5.40)
RDW: 14 % (ref 12–15)
WBC: 12.08 10*3/uL — ABNORMAL HIGH (ref 3.50–10.80)

## 2011-01-26 LAB — AMYLASE: Amylase: 82 U/L (ref 25–125)

## 2011-01-26 LAB — LAB USE ONLY - HISTORICAL GYN CYTOLOGY/PAP SMEAR

## 2011-01-26 LAB — HCG QUANTITATIVE: hCG, Quant.: 85955.6 m[IU]/mL

## 2011-01-26 LAB — HEMOLYSIS INDEX: Hemolysis Index: 19 Index — ABNORMAL HIGH (ref 0–18)

## 2011-01-26 LAB — LIPASE: Lipase: 23 U/L (ref 8–78)

## 2011-01-26 LAB — GFR: EGFR: >60

## 2011-01-26 LAB — ACETONE: Acetone Blood: NEGATIVE

## 2011-01-26 MED ORDER — SULFAMETHOXAZOLE-TRIMETHOPRIM 800-160 MG PO TABS
1.00 | ORAL_TABLET | Freq: Two times a day (BID) | ORAL | Status: AC
Start: 2011-01-26 — End: 2011-02-02

## 2011-01-27 ENCOUNTER — Encounter (INDEPENDENT_AMBULATORY_CARE_PROVIDER_SITE_OTHER): Payer: Self-pay | Admitting: Specialist

## 2011-01-27 ENCOUNTER — Other Ambulatory Visit (INDEPENDENT_AMBULATORY_CARE_PROVIDER_SITE_OTHER): Payer: Self-pay

## 2011-01-27 ENCOUNTER — Other Ambulatory Visit (INDEPENDENT_AMBULATORY_CARE_PROVIDER_SITE_OTHER): Payer: Self-pay | Admitting: Specialist

## 2011-01-27 DIAGNOSIS — O210 Mild hyperemesis gravidarum: Secondary | ICD-10-CM

## 2011-01-27 LAB — URINALYSIS, REFLEX TO MICROSCOPIC EXAM IF INDICATED
Bilirubin, UA: NEGATIVE
Blood, UA: NEGATIVE
Glucose, UA: NEGATIVE
Ketones UA: >80
Nitrite, UA: NEGATIVE
Protein, UR: 30 — AB
RBC, UA: 4 /HPF (ref 0–5)
Specific Gravity UA POCT: 1.018 (ref 1.001–1.035)
Squamous Epithelial Cells, Urine: 7 /HPF (ref 0–25)
Urine pH: 9 — AB (ref 5.0–8.0)
Urobilinogen, UA: NORMAL mg/dL
WBC, UA: 7 /HPF (ref 0–5)

## 2011-01-27 MED ORDER — PROMETHAZINE HCL 12.5 MG PO TABS
12.50 mg | ORAL_TABLET | Freq: Four times a day (QID) | ORAL | Status: AC | PRN
Start: 2011-01-27 — End: 2011-02-03

## 2011-01-27 NOTE — Telephone Encounter (Signed)
Pt aware Rx for phenergan as well as Bactrim have been called in and will need to p/u both meds later today at [facility].  Instructed to d/c Macrobid Rx given from ED and start Bactrim asap today.

## 2011-01-29 ENCOUNTER — Emergency Department
Admit: 2011-01-29 | Disposition: A | Discharge status: Home / Self Care | Payer: Self-pay | Source: Emergency Department | Admitting: Emergency Medicine

## 2011-01-30 LAB — HEPATIC FUNCTION PANEL
ALT: 16 U/L (ref 0–55)
AST (SGOT): 12 U/L (ref 5–34)
Albumin/Globulin Ratio: 1 (ref 0.9–2.2)
Albumin: 4 g/dL (ref 3.5–5.0)
Alkaline Phosphatase: 55 U/L (ref 40–150)
Bilirubin Direct: 0.2 mg/dL (ref 0.0–0.5)
Bilirubin Indirect: 0.2 mg/dL (ref 0.0–1.0)
Bilirubin, Total: 0.4 mg/dL (ref 0.2–1.2)
Globulin: 4.1 g/dL — ABNORMAL HIGH (ref 2.0–3.6)
Protein, Total: 8.1 g/dL (ref 6.0–8.3)

## 2011-01-30 LAB — CBC AND DIFFERENTIAL
Basophils Absolute Automated: 0.03 10*3/uL (ref 0.00–0.20)
Basophils Automated: 0 % (ref 0–2)
Eosinophils Absolute Automated: 0.02 10*3/uL (ref 0.00–0.70)
Eosinophils Automated: 0 % (ref 0–5)
Hematocrit: 38.4 % (ref 37.0–47.0)
Hgb: 13.4 g/dL (ref 12.0–16.0)
Immature Granulocytes Absolute: 0.04 10*3/uL
Immature Granulocytes: 0 % (ref 0–1)
Lymphocytes Absolute Automated: 2.21 10*3/uL (ref 0.50–4.40)
Lymphocytes Automated: 17 % (ref 15–41)
MCH: 27.9 pg — ABNORMAL LOW (ref 28.0–32.0)
MCHC: 34.9 g/dL (ref 32.0–36.0)
MCV: 79.8 fL — ABNORMAL LOW (ref 80.0–100.0)
MPV: 9.1 fL — ABNORMAL LOW (ref 9.4–12.3)
Monocytes Absolute Automated: 0.73 10*3/uL (ref 0.00–1.20)
Monocytes: 6 % (ref 0–11)
Neutrophils Absolute: 9.94 10*3/uL — ABNORMAL HIGH (ref 1.80–8.10)
Neutrophils: 77 % — ABNORMAL HIGH (ref 52–75)
Nucleated RBC: 0 /100 WBC
Platelets: 396 10*3/uL (ref 140–400)
RBC: 4.81 10*6/uL (ref 4.20–5.40)
RDW: 14 % (ref 12–15)
WBC: 12.93 10*3/uL — ABNORMAL HIGH (ref 3.50–10.80)

## 2011-01-30 LAB — URINALYSIS, REFLEX TO MICROSCOPIC EXAM IF INDICATED
Blood, UA: NEGATIVE
Glucose, UA: NEGATIVE
Ketones UA: 300 — AB
Nitrite, UA: NEGATIVE
Protein, UR: 100 — AB
RBC, UA: 8 /HPF (ref 0–5)
Specific Gravity UA POCT: 1.015 (ref 1.001–1.035)
Squamous Epithelial Cells, Urine: 8 /HPF (ref 0–25)
Urine pH: 7 (ref 5.0–8.0)
Urobilinogen, UA: 2 mg/dL

## 2011-01-30 LAB — BASIC METABOLIC PANEL
Anion Gap: 16 — ABNORMAL HIGH (ref 5.0–15.0)
BUN: 8 mg/dL (ref 7.0–19.0)
CO2: 23 mEq/L (ref 22–29)
Calcium: 10.3 mg/dL (ref 8.5–10.5)
Chloride: 100 mEq/L (ref 98–107)
Creatinine: 0.6 mg/dL (ref 0.6–1.0)
Glucose: 169 mg/dL — ABNORMAL HIGH (ref 70–100)
Potassium: 3.3 mEq/L — ABNORMAL LOW (ref 3.5–5.1)
Sodium: 139 mEq/L (ref 136–145)

## 2011-01-30 LAB — HEMOLYSIS INDEX: Hemolysis Index: 5 Index (ref 0–18)

## 2011-01-30 LAB — URINE ICTOTEST: Urine Ictotest: NEGATIVE

## 2011-01-30 LAB — GFR: EGFR: >60

## 2011-02-09 ENCOUNTER — Inpatient Hospital Stay (HOSPITAL_BASED_OUTPATIENT_CLINIC_OR_DEPARTMENT_OTHER)
Admission: RE | Admit: 2011-02-09 | Disposition: A | Discharge status: Home / Self Care | Payer: Self-pay | Source: Ambulatory Visit

## 2011-02-09 ENCOUNTER — Ambulatory Visit (INDEPENDENT_AMBULATORY_CARE_PROVIDER_SITE_OTHER): Payer: Self-pay | Admitting: Obstetrics & Gynecology

## 2011-02-09 VITALS — BP 129/79 | Wt 108.1 lb

## 2011-02-09 DIAGNOSIS — O24919 Unspecified diabetes mellitus in pregnancy, unspecified trimester: Secondary | ICD-10-CM

## 2011-02-09 DIAGNOSIS — Z3480 Encounter for supervision of other normal pregnancy, unspecified trimester: Secondary | ICD-10-CM

## 2011-02-09 DIAGNOSIS — Z98891 History of uterine scar from previous surgery: Secondary | ICD-10-CM

## 2011-02-09 DIAGNOSIS — IMO0002 Reserved for concepts with insufficient information to code with codable children: Secondary | ICD-10-CM

## 2011-02-09 LAB — POCT PROTEIN, URINE, QUALITATIVE, DIPSTICK

## 2011-02-09 LAB — COMPREHENSIVE METABOLIC PANEL
ALT: 28 U/L (ref 3–36)
AST (SGOT): 19 U/L (ref 10–41)
Albumin/Globulin Ratio: 1.3 (ref 1.1–1.8)
Albumin: 3.5 g/dL (ref 3.4–4.9)
Alkaline Phosphatase: 53 U/L (ref 43–112)
BUN: 6 mg/dL — ABNORMAL LOW (ref 8–20)
Bilirubin, Total: 0.3 mg/dL (ref 0.1–1.0)
CO2: 29 mEq/L (ref 21–30)
Calcium: 9 mg/dL (ref 8.6–10.2)
Chloride: 97 mEq/L — ABNORMAL LOW (ref 98–107)
Creatinine: 0.4 mg/dL — ABNORMAL LOW (ref 0.6–1.5)
Globulin: 2.6 g/dL (ref 2.0–3.7)
Glucose: 170 mg/dL — ABNORMAL HIGH (ref 70–100)
Potassium: 3.8 mEq/L (ref 3.6–5.0)
Protein, Total: 6.1 g/dL (ref 6.0–8.0)
Sodium: 136 mEq/L (ref 136–146)

## 2011-02-09 LAB — TYPE AND SCREEN
AB Screen Gel: NEGATIVE
ABO Rh: O POS

## 2011-02-09 LAB — CBC
Hematocrit: 32.2 % — ABNORMAL LOW (ref 37.0–47.0)
Hgb: 10.9 g/dL — ABNORMAL LOW (ref 12.0–16.0)
MCH: 27 pg — ABNORMAL LOW (ref 28.0–32.0)
MCHC: 33.9 g/dL (ref 32.0–36.0)
MCV: 79.9 fL — ABNORMAL LOW (ref 80.0–100.0)
MPV: 9.4 fL (ref 9.4–12.3)
Nucleated RBC: 0 /100 WBC
Platelets: 296 10*3/uL (ref 140–400)
RBC: 4.03 10*6/uL — ABNORMAL LOW (ref 4.20–5.40)
RDW: 14 % (ref 12–15)
WBC: 9.12 10*3/uL (ref 3.50–10.80)

## 2011-02-09 LAB — URINALYSIS, REFLEX TO MICROSCOPIC EXAM IF INDICATED
Bilirubin, UA: NEGATIVE
Blood, UA: NEGATIVE
Glucose, UA: 500 — AB
Ketones UA: NEGATIVE
Nitrite, UA: NEGATIVE
Protein, UR: 30 — AB
Specific Gravity UA POCT: 1.016 (ref 1.001–1.035)
Urine pH: 7.5 (ref 5.0–8.0)
Urobilinogen, UA: 2 mg/dL

## 2011-02-09 LAB — GFR: EGFR: >60

## 2011-02-09 LAB — HIV RAPID: HIV Rapid: NONREACTIVE

## 2011-02-09 LAB — POCT GLUCOSE, URINE, QUALITATIVE, DIPSTICK

## 2011-02-09 LAB — LACTATE DEHYDROGENASE: LDH: 399 U/L (ref 307–575)

## 2011-02-09 LAB — HEMOGLOBIN A1C: Hemoglobin A1C: 6.7 % — ABNORMAL HIGH (ref 0.0–6.0)

## 2011-02-09 LAB — URIC ACID: Uric acid: 3.5 mg/dL (ref 2.3–6.1)

## 2011-02-09 NOTE — Patient Instructions (Signed)
Pt was sent to [facility] unit for elevated glucose level consistently since 01/23/11 to 02/06/11, Dr. Nonnie Done sent a communication sheet with updated information, pt is being transported via car accompanied by her husband, instructions given, verbalizing understanding.Marland KitchenMORALES, Iven Earnhart C

## 2011-02-09 NOTE — Progress Notes (Signed)
To [facility] for admission BS control  Patient thinking about moving to [place] but understands might not get best medical care there for this HR pregnancy

## 2011-02-11 LAB — URINE CULTURE

## 2011-02-13 LAB — CBC AND DIFFERENTIAL
Basophils Absolute Automated: 0.02 10*3/uL (ref 0.00–0.20)
Basophils Automated: 0 % (ref 0–2)
Eosinophils Absolute Automated: 0.08 10*3/uL (ref 0.00–0.70)
Eosinophils Automated: 1 % (ref 0–5)
Hematocrit: 35.2 % — ABNORMAL LOW (ref 37.0–47.0)
Hgb: 11.7 g/dL — ABNORMAL LOW (ref 12.0–16.0)
Immature Granulocytes Absolute: 0.02 10*3/uL
Immature Granulocytes: 0 % (ref 0–1)
Lymphocytes Absolute Automated: 2.59 10*3/uL (ref 0.50–4.40)
Lymphocytes Automated: 22 % (ref 15–41)
MCH: 26.8 pg — ABNORMAL LOW (ref 28.0–32.0)
MCHC: 33.2 g/dL (ref 32.0–36.0)
MCV: 80.5 fL (ref 80.0–100.0)
MPV: 9.1 fL — ABNORMAL LOW (ref 9.4–12.3)
Monocytes Absolute Automated: 0.59 10*3/uL (ref 0.00–1.20)
Monocytes: 5 % (ref 0–11)
Neutrophils Absolute: 8.25 10*3/uL — ABNORMAL HIGH (ref 1.80–8.10)
Neutrophils: 71 % (ref 52–75)
Nucleated RBC: 0 /100 WBC
Platelets: 350 10*3/uL (ref 140–400)
RBC: 4.37 10*6/uL (ref 4.20–5.40)
RDW: 14 % (ref 12–15)
WBC: 11.55 10*3/uL — ABNORMAL HIGH (ref 3.50–10.80)

## 2011-02-13 LAB — COMPREHENSIVE METABOLIC PANEL
ALT: 21 U/L (ref 3–36)
AST (SGOT): 15 U/L (ref 10–41)
Albumin/Globulin Ratio: 1.2 (ref 1.1–1.8)
Albumin: 3.8 g/dL (ref 3.4–4.9)
Alkaline Phosphatase: 63 U/L (ref 43–112)
BUN: 7 mg/dL — ABNORMAL LOW (ref 8–20)
Bilirubin, Total: 0.3 mg/dL (ref 0.1–1.0)
CO2: 21 mEq/L (ref 21–30)
Calcium: 9.3 mg/dL (ref 8.6–10.2)
Chloride: 104 mEq/L (ref 98–107)
Creatinine: 0.4 mg/dL — ABNORMAL LOW (ref 0.6–1.5)
Globulin: 3.3 g/dL (ref 2.0–3.7)
Glucose: 125 mg/dL — ABNORMAL HIGH (ref 70–100)
Potassium: 4.6 mEq/L (ref 3.6–5.0)
Protein, Total: 7.1 g/dL (ref 6.0–8.0)
Sodium: 140 mEq/L (ref 136–146)

## 2011-02-13 LAB — MAGNESIUM: Magnesium: 1.7 mg/dL (ref 1.6–2.3)

## 2011-02-13 LAB — GFR: EGFR: >60

## 2011-02-13 LAB — PHOSPHORUS: Phosphorus: 4.2 mg/dL (ref 2.5–4.5)

## 2011-02-13 LAB — LIPASE: Lipase: 144 U/L (ref 32–219)

## 2011-02-21 ENCOUNTER — Emergency Department
Admit: 2011-02-21 | Discharge: 2011-02-21 | Disposition: A | Discharge status: Home / Self Care | Payer: Self-pay | Source: Emergency Department | Admitting: Emergency Medicine

## 2011-02-22 LAB — CBC AND DIFFERENTIAL
Basophils Absolute Automated: 0.03 10*3/uL (ref 0.00–0.20)
Basophils Automated: 0 % (ref 0–2)
Eosinophils Absolute Automated: 0.03 10*3/uL (ref 0.00–0.70)
Eosinophils Automated: 0 % (ref 0–5)
Hematocrit: 36.1 % — ABNORMAL LOW (ref 37.0–47.0)
Hgb: 12.4 g/dL (ref 12.0–16.0)
Immature Granulocytes Absolute: 0.05 10*3/uL
Immature Granulocytes: 0 % (ref 0–1)
Lymphocytes Absolute Automated: 2.34 10*3/uL (ref 0.50–4.40)
Lymphocytes Automated: 14 % — ABNORMAL LOW (ref 15–41)
MCH: 27.2 pg — ABNORMAL LOW (ref 28.0–32.0)
MCHC: 34.3 g/dL (ref 32.0–36.0)
MCV: 79.2 fL — ABNORMAL LOW (ref 80.0–100.0)
MPV: 8.7 fL — ABNORMAL LOW (ref 9.4–12.3)
Monocytes Absolute Automated: 0.62 10*3/uL (ref 0.00–1.20)
Monocytes: 4 % (ref 0–11)
Neutrophils Absolute: 13.16 10*3/uL — ABNORMAL HIGH (ref 1.80–8.10)
Neutrophils: 81 % — ABNORMAL HIGH (ref 52–75)
Nucleated RBC: 0 /100 WBC
Platelets: 372 10*3/uL (ref 140–400)
RBC: 4.56 10*6/uL (ref 4.20–5.40)
RDW: 15 % (ref 12–15)
WBC: 16.23 10*3/uL — ABNORMAL HIGH (ref 3.50–10.80)

## 2011-02-22 LAB — BASIC METABOLIC PANEL
BUN: 12 mg/dL (ref 8–20)
CO2: 22 mEq/L (ref 21–30)
Calcium: 9.3 mg/dL (ref 8.6–10.2)
Chloride: 100 mEq/L (ref 98–107)
Creatinine: 0.4 mg/dL — ABNORMAL LOW (ref 0.6–1.5)
Glucose: 131 mg/dL — ABNORMAL HIGH (ref 70–100)
Potassium: 3.3 mEq/L — ABNORMAL LOW (ref 3.6–5.0)
Sodium: 140 mEq/L (ref 136–146)

## 2011-02-22 LAB — URINALYSIS, REFLEX TO MICROSCOPIC EXAM IF INDICATED
Bilirubin, UA: NEGATIVE
Blood, UA: NEGATIVE
Glucose, UA: NEGATIVE
Ketones UA: >=80 — AB
Nitrite, UA: NEGATIVE
Protein, UR: 30 — AB
Specific Gravity UA POCT: 1.023 (ref 1.001–1.035)
Urine pH: 6 (ref 5.0–8.0)
Urobilinogen, UA: NORMAL mg/dL

## 2011-02-22 LAB — HEPATIC FUNCTION PANEL
ALT: 22 U/L (ref 3–36)
AST (SGOT): 19 U/L (ref 10–41)
Albumin/Globulin Ratio: 1.2 (ref 1.1–1.8)
Albumin: 4.1 g/dL (ref 3.4–4.9)
Alkaline Phosphatase: 62 U/L (ref 43–112)
Bilirubin Direct: 0 mg/dL (ref 0.0–0.3)
Bilirubin Indirect: 0.5 mg/dL (ref 0.1–0.9)
Bilirubin, Total: 0.5 mg/dL (ref 0.1–1.0)
Globulin: 3.5 g/dL (ref 2.0–3.7)
Protein, Total: 7.6 g/dL (ref 6.0–8.0)

## 2011-02-22 LAB — GFR: EGFR: >60

## 2011-02-24 LAB — URINE CULTURE

## 2011-02-26 ENCOUNTER — Inpatient Hospital Stay (HOSPITAL_BASED_OUTPATIENT_CLINIC_OR_DEPARTMENT_OTHER)
Admission: AD | Admit: 2011-02-26 | Discharge: 2011-03-01 | Disposition: A | Discharge status: Home / Self Care | Payer: Self-pay | Source: Ambulatory Visit

## 2011-02-26 ENCOUNTER — Ambulatory Visit (INDEPENDENT_AMBULATORY_CARE_PROVIDER_SITE_OTHER): Payer: Self-pay | Admitting: Obstetrics & Gynecology

## 2011-02-26 VITALS — BP 125/81 | Wt 101.8 lb

## 2011-02-26 DIAGNOSIS — N390 Urinary tract infection, site not specified: Secondary | ICD-10-CM

## 2011-02-26 DIAGNOSIS — O09899 Supervision of other high risk pregnancies, unspecified trimester: Secondary | ICD-10-CM | POA: Insufficient documentation

## 2011-02-26 DIAGNOSIS — N12 Tubulo-interstitial nephritis, not specified as acute or chronic: Secondary | ICD-10-CM | POA: Insufficient documentation

## 2011-02-26 DIAGNOSIS — IMO0002 Reserved for concepts with insufficient information to code with codable children: Secondary | ICD-10-CM

## 2011-02-26 DIAGNOSIS — O24919 Unspecified diabetes mellitus in pregnancy, unspecified trimester: Secondary | ICD-10-CM

## 2011-02-26 DIAGNOSIS — Z3480 Encounter for supervision of other normal pregnancy, unspecified trimester: Secondary | ICD-10-CM

## 2011-02-26 LAB — POCT GLUCOSE: Whole Blood Glucose POCT: 74 mg/dL (ref 70–100)

## 2011-02-26 LAB — CBC AND DIFFERENTIAL
Basophils Absolute Automated: 0.02 10*3/uL (ref 0.00–0.20)
Basophils Automated: 0 % (ref 0–2)
Eosinophils Absolute Automated: 0.02 10*3/uL (ref 0.00–0.70)
Eosinophils Automated: 0 % (ref 0–5)
Hematocrit: 33.7 % — ABNORMAL LOW (ref 37.0–47.0)
Hgb: 11.7 g/dL — ABNORMAL LOW (ref 12.0–16.0)
Immature Granulocytes Absolute: 0.03 10*3/uL
Immature Granulocytes: 0 % (ref 0–1)
Lymphocytes Absolute Automated: 2.43 10*3/uL (ref 0.50–4.40)
Lymphocytes Automated: 21 % (ref 15–41)
MCH: 27.5 pg — ABNORMAL LOW (ref 28.0–32.0)
MCHC: 34.7 g/dL (ref 32.0–36.0)
MCV: 79.3 fL — ABNORMAL LOW (ref 80.0–100.0)
MPV: 8.6 fL — ABNORMAL LOW (ref 9.4–12.3)
Monocytes Absolute Automated: 0.63 10*3/uL (ref 0.00–1.20)
Monocytes: 5 % (ref 0–11)
Neutrophils Absolute: 8.51 10*3/uL — ABNORMAL HIGH (ref 1.80–8.10)
Neutrophils: 73 % (ref 52–75)
Nucleated RBC: 0 /100 WBC
Platelets: 368 10*3/uL (ref 140–400)
RBC: 4.25 10*6/uL (ref 4.20–5.40)
RDW: 15 % (ref 12–15)
WBC: 11.64 10*3/uL — ABNORMAL HIGH (ref 3.50–10.80)

## 2011-02-26 LAB — COMPREHENSIVE METABOLIC PANEL
ALT: 26 U/L (ref 3–36)
AST (SGOT): 18 U/L (ref 10–41)
Albumin/Globulin Ratio: 1.3 (ref 1.1–1.8)
Albumin: 3.9 g/dL (ref 3.4–4.9)
Alkaline Phosphatase: 64 U/L (ref 43–112)
BUN: 5 mg/dL — ABNORMAL LOW (ref 8–20)
Bilirubin, Total: 0.5 mg/dL (ref 0.1–1.0)
CO2: 26 mEq/L (ref 21–30)
Calcium: 9.1 mg/dL (ref 8.6–10.2)
Chloride: 100 mEq/L (ref 98–107)
Creatinine: 0.3 mg/dL — ABNORMAL LOW (ref 0.6–1.5)
Globulin: 3.1 g/dL (ref 2.0–3.7)
Glucose: 87 mg/dL (ref 70–100)
Potassium: 3.5 mEq/L — ABNORMAL LOW (ref 3.6–5.0)
Protein, Total: 7 g/dL (ref 6.0–8.0)
Sodium: 138 mEq/L (ref 136–146)

## 2011-02-26 LAB — HEPATIC FUNCTION PANEL
Bilirubin Direct: 0.2 mg/dL (ref 0.0–0.3)
Bilirubin Indirect: 0.3 mg/dL (ref 0.1–0.9)

## 2011-02-26 LAB — TYPE AND SCREEN
AB Screen Gel: NEGATIVE
ABO Rh: O POS

## 2011-02-26 LAB — AMYLASE: Amylase: 74 U/L (ref 0–90)

## 2011-02-26 LAB — LIPASE: Lipase: 49 U/L (ref 32–219)

## 2011-02-26 LAB — POCT PROTEIN, URINE, QUALITATIVE, DIPSTICK

## 2011-02-26 LAB — GFR: EGFR: >60

## 2011-02-26 LAB — POCT GLUCOSE, URINE, QUALITATIVE, DIPSTICK: Glucose, UA: NEGATIVE

## 2011-02-26 NOTE — Progress Notes (Signed)
Seem in ED last night. Patient presents c/o abdominal pain, emesis x 1 - 2 days.  Unable to eat.  Emesis x 10 today.  PE: dry mucous membranes, pulse 109, abdomen mild mid-epigastric tenderness.  No CVA tenderness. Random FSG: 74.  In ED, K= 3.3  Urine (+) ecoli. sono negative for calculus or hydronephrosis.  Impression. 14 week IUP class B DM with abdominal pain & significant dehydration resulting in weight loss and metabolic disturbance.  Admit to [facility]P for volume/electrolyte replacement, ABX, anti emetics.  RUQ sono

## 2011-02-27 LAB — URINALYSIS, REFLEX TO MICROSCOPIC EXAM IF INDICATED
Bilirubin, UA: NEGATIVE
Blood, UA: NEGATIVE
Glucose, UA: NEGATIVE
Ketones UA: 40 — AB
Nitrite, UA: NEGATIVE
Protein, UR: 30 — AB
Specific Gravity UA POCT: 1.017 (ref 1.001–1.035)
Urine pH: 8.5 — AB (ref 5.0–8.0)
Urobilinogen, UA: NORMAL mg/dL

## 2011-03-06 LAB — I-STAT CG4 VENOUS CARTRIDGE
Lactic Acid I-Stat: 0.8 mEq/L (ref 0.5–2.2)
i-STAT Base Excess Venous: 6 mEq/L
i-STAT FIO2: 96
i-STAT HCO3 Bicarbonate Venous: 29.7 mEq/L
i-STAT O2 Saturation Venous: 69 %
i-STAT Patient Temperature: 98.6
i-STAT Total CO2 Venous: 31 mEq/L
i-STAT pCO2 Venous: 36.9 mmHg
i-STAT pH Venous: 7.514
i-STAT pO2 Venous: 32 mmHg

## 2011-03-06 LAB — CBC AND DIFFERENTIAL
Basophils Absolute Automated: 0.03 10*3/uL (ref 0.00–0.20)
Basophils Automated: 0 % (ref 0–2)
Eosinophils Absolute Automated: 0.03 10*3/uL (ref 0.00–0.70)
Eosinophils Automated: 0 % (ref 0–5)
Hematocrit: 37.4 % (ref 37.0–47.0)
Hgb: 13.3 g/dL (ref 12.0–16.0)
Immature Granulocytes Absolute: 0.04 10*3/uL
Immature Granulocytes: 0 % (ref 0–1)
Lymphocytes Absolute Automated: 2.61 10*3/uL (ref 0.50–4.40)
Lymphocytes Automated: 17 % (ref 15–41)
MCH: 28.1 pg (ref 28.0–32.0)
MCHC: 35.6 g/dL (ref 32.0–36.0)
MCV: 78.9 fL — ABNORMAL LOW (ref 80.0–100.0)
MPV: 9 fL — ABNORMAL LOW (ref 9.4–12.3)
Monocytes Absolute Automated: 1.03 10*3/uL (ref 0.00–1.20)
Monocytes: 7 % (ref 0–11)
Neutrophils Absolute: 11.25 10*3/uL — ABNORMAL HIGH (ref 1.80–8.10)
Neutrophils: 75 % (ref 52–75)
Nucleated RBC: 0 /100 WBC
Platelets: 430 10*3/uL — ABNORMAL HIGH (ref 140–400)
RBC: 4.74 10*6/uL (ref 4.20–5.40)
RDW: 14 % (ref 12–15)
WBC: 14.99 10*3/uL — ABNORMAL HIGH (ref 3.50–10.80)

## 2011-03-06 LAB — BASIC METABOLIC PANEL
BUN: 13 mg/dL (ref 8–20)
CO2: 27 mEq/L (ref 21–30)
Calcium: 9.4 mg/dL (ref 8.6–10.2)
Chloride: 88 mEq/L — ABNORMAL LOW (ref 98–107)
Creatinine: 0.4 mg/dL — ABNORMAL LOW (ref 0.6–1.5)
Glucose: 119 mg/dL — ABNORMAL HIGH (ref 70–100)
Potassium: 2.9 mEq/L — ABNORMAL LOW (ref 3.6–5.0)
Sodium: 133 mEq/L — ABNORMAL LOW (ref 136–146)

## 2011-03-06 LAB — URINALYSIS, REFLEX TO MICROSCOPIC EXAM IF INDICATED
Bilirubin, UA: NEGATIVE
Blood, UA: NEGATIVE
Glucose, UA: NEGATIVE
Ketones UA: >=80 — AB
Nitrite, UA: NEGATIVE
Protein, UR: 30 — AB
Specific Gravity UA POCT: 1.022 (ref 1.001–1.035)
Urine pH: 6 (ref 5.0–8.0)
Urobilinogen, UA: NORMAL mg/dL

## 2011-03-06 LAB — HEPATIC FUNCTION PANEL
ALT: 15 U/L (ref 3–36)
AST (SGOT): 22 U/L (ref 10–41)
Albumin/Globulin Ratio: 1.2 (ref 1.1–1.8)
Albumin: 4.4 g/dL (ref 3.4–4.9)
Alkaline Phosphatase: 68 U/L (ref 43–112)
Bilirubin Direct: 0.5 mg/dL — ABNORMAL HIGH (ref 0.0–0.3)
Bilirubin Indirect: 0.4 mg/dL (ref 0.1–0.9)
Bilirubin, Total: 1 mg/dL (ref 0.1–1.0)
Globulin: 3.8 g/dL — ABNORMAL HIGH (ref 2.0–3.7)
Protein, Total: 8.2 g/dL — ABNORMAL HIGH (ref 6.0–8.0)

## 2011-03-06 LAB — GFR: EGFR: >60

## 2011-03-06 LAB — HCG QUANTITATIVE: hCG, Quant.: 27874.4 m[IU]/mL

## 2011-03-07 ENCOUNTER — Inpatient Hospital Stay (HOSPITAL_BASED_OUTPATIENT_CLINIC_OR_DEPARTMENT_OTHER)
Admission: EM | Admit: 2011-03-07 | Discharge: 2011-03-12 | Disposition: A | Discharge status: Home / Self Care | Payer: Self-pay | Source: Emergency Department

## 2011-03-07 LAB — TYPE AND SCREEN
AB Screen Gel: NEGATIVE
ABO Rh: O POS

## 2011-03-08 LAB — CBC AND DIFFERENTIAL
Basophils Absolute Automated: 0.03 10*3/uL (ref 0.00–0.20)
Basophils Automated: 0 % (ref 0–2)
Eosinophils Absolute Automated: 0.05 10*3/uL (ref 0.00–0.70)
Eosinophils Automated: 0 % (ref 0–5)
Hematocrit: 29.8 % — ABNORMAL LOW (ref 37.0–47.0)
Hgb: 10.2 g/dL — ABNORMAL LOW (ref 12.0–16.0)
Immature Granulocytes Absolute: 0.02 10*3/uL
Immature Granulocytes: 0 % (ref 0–1)
Lymphocytes Absolute Automated: 2.44 10*3/uL (ref 0.50–4.40)
Lymphocytes Automated: 23 % (ref 15–41)
MCH: 27.1 pg — ABNORMAL LOW (ref 28.0–32.0)
MCHC: 34.2 g/dL (ref 32.0–36.0)
MCV: 79.3 fL — ABNORMAL LOW (ref 80.0–100.0)
MPV: 8.9 fL — ABNORMAL LOW (ref 9.4–12.3)
Monocytes Absolute Automated: 0.55 10*3/uL (ref 0.00–1.20)
Monocytes: 5 % (ref 0–11)
Neutrophils Absolute: 7.43 10*3/uL (ref 1.80–8.10)
Neutrophils: 71 % (ref 52–75)
Nucleated RBC: 0 /100 WBC
Platelets: 326 10*3/uL (ref 140–400)
RBC: 3.76 10*6/uL — ABNORMAL LOW (ref 4.20–5.40)
RDW: 15 % (ref 12–15)
WBC: 10.52 10*3/uL (ref 3.50–10.80)

## 2011-03-08 LAB — URIC ACID: Uric acid: 6.2 mg/dL — ABNORMAL HIGH (ref 2.3–6.1)

## 2011-03-08 LAB — URINE CULTURE

## 2011-03-08 LAB — ALT: ALT: 23 U/L (ref 3–36)

## 2011-03-08 LAB — GFR: EGFR: >60

## 2011-03-08 LAB — CREATININE, SERUM: Creatinine: 0.4 mg/dL — ABNORMAL LOW (ref 0.6–1.5)

## 2011-03-08 LAB — AST: AST (SGOT): 16 U/L (ref 10–41)

## 2011-03-08 LAB — LACTATE DEHYDROGENASE: LDH: 317 U/L (ref 307–575)

## 2011-03-09 LAB — CBC
Hematocrit: 31.5 % — ABNORMAL LOW (ref 37.0–47.0)
Hgb: 10.9 g/dL — ABNORMAL LOW (ref 12.0–16.0)
MCH: 27.4 pg — ABNORMAL LOW (ref 28.0–32.0)
MCHC: 34.6 g/dL (ref 32.0–36.0)
MCV: 79.1 fL — ABNORMAL LOW (ref 80.0–100.0)
MPV: 8.8 fL — ABNORMAL LOW (ref 9.4–12.3)
Nucleated RBC: 0 /100 WBC
Platelets: 334 10*3/uL (ref 140–400)
RBC: 3.98 10*6/uL — ABNORMAL LOW (ref 4.20–5.40)
RDW: 15 % (ref 12–15)
WBC: 9.45 10*3/uL (ref 3.50–10.80)

## 2011-03-09 LAB — CBC AND DIFFERENTIAL
Basophils Absolute Automated: 0.02 10*3/uL (ref 0.00–0.20)
Basophils Automated: 0 % (ref 0–2)
Eosinophils Absolute Automated: 0.05 10*3/uL (ref 0.00–0.70)
Eosinophils Automated: 1 % (ref 0–5)
Hematocrit: 30.7 % — ABNORMAL LOW (ref 37.0–47.0)
Hgb: 10.7 g/dL — ABNORMAL LOW (ref 12.0–16.0)
Immature Granulocytes Absolute: 0.02 10*3/uL
Immature Granulocytes: 0 % (ref 0–1)
Lymphocytes Absolute Automated: 2.72 10*3/uL (ref 0.50–4.40)
Lymphocytes Automated: 30 % (ref 15–41)
MCH: 27.7 pg — ABNORMAL LOW (ref 28.0–32.0)
MCHC: 34.9 g/dL (ref 32.0–36.0)
MCV: 79.5 fL — ABNORMAL LOW (ref 80.0–100.0)
MPV: 8.6 fL — ABNORMAL LOW (ref 9.4–12.3)
Monocytes Absolute Automated: 0.59 10*3/uL (ref 0.00–1.20)
Monocytes: 7 % (ref 0–11)
Neutrophils Absolute: 5.57 10*3/uL (ref 1.80–8.10)
Neutrophils: 62 % (ref 52–75)
Nucleated RBC: 0 /100 WBC
Platelets: 311 10*3/uL (ref 140–400)
RBC: 3.86 10*6/uL — ABNORMAL LOW (ref 4.20–5.40)
RDW: 15 % (ref 12–15)
WBC: 8.97 10*3/uL (ref 3.50–10.80)

## 2011-03-09 LAB — URINALYSIS, REFLEX TO MICROSCOPIC EXAM IF INDICATED
Bilirubin, UA: NEGATIVE
Blood, UA: NEGATIVE
Glucose, UA: 70
Ketones UA: 10
Leukocyte Esterase, UA: NEGATIVE
Nitrite, UA: NEGATIVE
Protein, UR: NEGATIVE
Specific Gravity UA POCT: 1.007 (ref 1.001–1.035)
Urine pH: 7.5 (ref 5.0–8.0)
Urobilinogen, UA: NORMAL mg/dL

## 2011-03-10 LAB — URINE CULTURE: Urine Culture, Routine: NO GROWTH

## 2011-03-15 ENCOUNTER — Ambulatory Visit (INDEPENDENT_AMBULATORY_CARE_PROVIDER_SITE_OTHER): Payer: Self-pay | Admitting: Obstetrics & Gynecology

## 2011-03-15 VITALS — BP 119/78 | Wt 108.1 lb

## 2011-03-15 DIAGNOSIS — Z98891 History of uterine scar from previous surgery: Secondary | ICD-10-CM

## 2011-03-15 DIAGNOSIS — O24919 Unspecified diabetes mellitus in pregnancy, unspecified trimester: Secondary | ICD-10-CM

## 2011-03-15 DIAGNOSIS — IMO0002 Reserved for concepts with insufficient information to code with codable children: Secondary | ICD-10-CM

## 2011-03-15 DIAGNOSIS — O09899 Supervision of other high risk pregnancies, unspecified trimester: Secondary | ICD-10-CM

## 2011-03-15 DIAGNOSIS — N12 Tubulo-interstitial nephritis, not specified as acute or chronic: Secondary | ICD-10-CM

## 2011-03-15 DIAGNOSIS — Z9889 Other specified postprocedural states: Secondary | ICD-10-CM

## 2011-03-15 LAB — POCT PROTEIN, URINE, QUALITATIVE, DIPSTICK: POCT Protein, UA: NEGATIVE

## 2011-03-15 LAB — POCT GLUCOSE, URINE, QUALITATIVE, DIPSTICK: Glucose, UA: NEGATIVE

## 2011-03-15 MED ORDER — INSULIN REGULAR HUMAN 100 UNIT/ML IJ SOLN
7.00 [IU] | Freq: Three times a day (TID) | INTRAMUSCULAR | Status: DC
Start: 2011-03-15 — End: 2011-03-22

## 2011-03-15 MED ORDER — INSULIN NPH (HUMAN) (ISOPHANE) 100 UNIT/ML SC SUSP
16.00 [IU] | Freq: Two times a day (BID) | SUBCUTANEOUS | Status: DC
Start: 2011-03-15 — End: 2011-03-22

## 2011-03-15 MED ORDER — FERROUS SULFATE 325 (65 FE) MG PO TABS
325.00 mg | ORAL_TABLET | Freq: Every day | ORAL | Status: AC
Start: 2011-03-15 — End: 2012-03-14

## 2011-03-15 NOTE — Progress Notes (Signed)
Hospitalized 5/27 - 6/1 for pyelonephritis,  Treated with gentamycin, now on Keflex prophylaxis  Renal sono WNL  FSBS monitored while in hospital. Has 3 days of documentation  With half elevated  Declines quad screen - would no terminate  sono June 20th  Constipation - fiber, colace, hydration etc reviewed

## 2011-03-22 ENCOUNTER — Ambulatory Visit (INDEPENDENT_AMBULATORY_CARE_PROVIDER_SITE_OTHER): Payer: Self-pay | Admitting: Specialist

## 2011-03-22 ENCOUNTER — Encounter (INDEPENDENT_AMBULATORY_CARE_PROVIDER_SITE_OTHER): Payer: Self-pay | Admitting: Specialist

## 2011-03-22 DIAGNOSIS — O09899 Supervision of other high risk pregnancies, unspecified trimester: Secondary | ICD-10-CM

## 2011-03-22 DIAGNOSIS — IMO0002 Reserved for concepts with insufficient information to code with codable children: Secondary | ICD-10-CM

## 2011-03-22 DIAGNOSIS — O24919 Unspecified diabetes mellitus in pregnancy, unspecified trimester: Secondary | ICD-10-CM

## 2011-03-22 DIAGNOSIS — N12 Tubulo-interstitial nephritis, not specified as acute or chronic: Secondary | ICD-10-CM

## 2011-03-22 LAB — POCT GLUCOSE, URINE, QUALITATIVE, DIPSTICK: Glucose, UA: NEGATIVE

## 2011-03-22 LAB — POCT PROTEIN, URINE, QUALITATIVE, DIPSTICK

## 2011-03-22 MED ORDER — INSULIN SYRINGE 29G X 1" 1 ML MISC
Status: DC
Start: 2011-03-22 — End: 2014-04-27

## 2011-03-22 NOTE — Progress Notes (Signed)
Patient with complaints of nausea, emesis, diarrhea for the past few days, improving since yesterday.  States her daughter had similar symptoms recently.  Recommended imodium, hydration, rest, and to discontinue pepto bismol.    Class B DM - reviewed log, consistently elevated post breakfast.  Adjusted insulin accordingly.  Taking Keflex suppression for h/o pyelonephritis.

## 2011-03-30 LAB — COMPREHENSIVE METABOLIC PANEL
ALT: 21 U/L (ref 3–36)
AST (SGOT): 16 U/L (ref 10–41)
Albumin/Globulin Ratio: 1 — ABNORMAL LOW (ref 1.1–1.8)
Albumin: 3.2 g/dL — ABNORMAL LOW (ref 3.4–4.9)
Alkaline Phosphatase: 62 U/L (ref 43–112)
BUN: 6 mg/dL — ABNORMAL LOW (ref 8–20)
Bilirubin, Total: 0.4 mg/dL (ref 0.1–1.0)
CO2: 24 mEq/L (ref 21–30)
Calcium: 8.5 mg/dL — ABNORMAL LOW (ref 8.6–10.2)
Chloride: 101 mEq/L (ref 98–107)
Creatinine: 0.4 mg/dL — ABNORMAL LOW (ref 0.6–1.5)
Globulin: 3.1 g/dL (ref 2.0–3.7)
Glucose: 99 mg/dL (ref 70–100)
Potassium: 3.2 mEq/L — ABNORMAL LOW (ref 3.6–5.0)
Protein, Total: 6.3 g/dL (ref 6.0–8.0)
Sodium: 136 mEq/L (ref 136–146)

## 2011-03-30 LAB — CBC AND DIFFERENTIAL
Basophils Absolute Automated: 0.04 10*3/uL (ref 0.00–0.20)
Basophils Automated: 0 % (ref 0–2)
Eosinophils Absolute Automated: 0.04 10*3/uL (ref 0.00–0.70)
Eosinophils Automated: 0 % (ref 0–5)
Hematocrit: 30.3 % — ABNORMAL LOW (ref 37.0–47.0)
Hgb: 10.3 g/dL — ABNORMAL LOW (ref 12.0–16.0)
Immature Granulocytes Absolute: 0.02 10*3/uL
Immature Granulocytes: 0 % (ref 0–1)
Lymphocytes Absolute Automated: 2.06 10*3/uL (ref 0.50–4.40)
Lymphocytes Automated: 21 % (ref 15–41)
MCH: 27.8 pg — ABNORMAL LOW (ref 28.0–32.0)
MCHC: 34 g/dL (ref 32.0–36.0)
MCV: 81.7 fL (ref 80.0–100.0)
MPV: 8.4 fL — ABNORMAL LOW (ref 9.4–12.3)
Monocytes Absolute Automated: 0.63 10*3/uL (ref 0.00–1.20)
Monocytes: 6 % (ref 0–11)
Neutrophils Absolute: 6.92 10*3/uL (ref 1.80–8.10)
Neutrophils: 71 % (ref 52–75)
Nucleated RBC: 0 /100 WBC
Platelets: 455 10*3/uL — ABNORMAL HIGH (ref 140–400)
RBC: 3.71 10*6/uL — ABNORMAL LOW (ref 4.20–5.40)
RDW: 15 % (ref 12–15)
WBC: 9.71 10*3/uL (ref 3.50–10.80)

## 2011-03-30 LAB — GFR: EGFR: >60

## 2011-03-30 LAB — LIPASE: Lipase: 75 U/L (ref 32–219)

## 2011-03-31 ENCOUNTER — Inpatient Hospital Stay (HOSPITAL_BASED_OUTPATIENT_CLINIC_OR_DEPARTMENT_OTHER)
Admission: EM | Admit: 2011-03-31 | Disposition: A | Discharge status: Home / Self Care | Payer: Self-pay | Source: Emergency Department

## 2011-03-31 ENCOUNTER — Ambulatory Visit: Admit: 2011-03-31 | Discharge: 2011-03-31 | Payer: Self-pay | Source: Ambulatory Visit

## 2011-03-31 LAB — BASIC METABOLIC PANEL
BUN: 4 mg/dL — ABNORMAL LOW (ref 8–20)
CO2: 23 mEq/L (ref 21–30)
Calcium: 7.9 mg/dL — ABNORMAL LOW (ref 8.6–10.2)
Chloride: 105 mEq/L (ref 98–107)
Creatinine: 0.3 mg/dL — ABNORMAL LOW (ref 0.6–1.5)
Glucose: 81 mg/dL (ref 70–100)
Potassium: 3.7 mEq/L (ref 3.6–5.0)
Sodium: 135 mEq/L — ABNORMAL LOW (ref 136–146)

## 2011-03-31 LAB — URINALYSIS, REFLEX TO MICROSCOPIC EXAM IF INDICATED
Bilirubin, UA: NEGATIVE
Blood, UA: NEGATIVE
Ketones UA: >=80 — AB
Nitrite, UA: NEGATIVE
Specific Gravity UA POCT: 1.014 (ref 1.001–1.035)
Urine pH: 7.5 (ref 5.0–8.0)
Urobilinogen, UA: NORMAL mg/dL

## 2011-03-31 LAB — GFR: EGFR: >60

## 2011-04-01 LAB — URINE CULTURE: Urine Culture, Routine: NO GROWTH

## 2011-04-02 LAB — H. PYLORI ANTIBODY, IGG: Helicobacter Pylori Antibody: NEGATIVE

## 2011-04-02 LAB — PREALBUMIN: Prealbumin: 16.5 mg/dL — ABNORMAL LOW (ref 19.9–41.9)

## 2011-04-02 LAB — ALBUMIN: Albumin: 3 g/dL — ABNORMAL LOW (ref 3.4–4.9)

## 2011-04-03 LAB — HEMOGLOBIN A1C: Hemoglobin A1C: 6 % (ref 0.0–6.0)

## 2011-04-12 ENCOUNTER — Encounter (INDEPENDENT_AMBULATORY_CARE_PROVIDER_SITE_OTHER): Payer: Self-pay | Admitting: Obstetrics & Gynecology

## 2011-04-25 ENCOUNTER — Inpatient Hospital Stay (HOSPITAL_BASED_OUTPATIENT_CLINIC_OR_DEPARTMENT_OTHER)
Admission: RE | Admit: 2011-04-25 | Disposition: A | Discharge status: Home / Self Care | Payer: Self-pay | Source: Ambulatory Visit

## 2011-04-25 LAB — CBC AND DIFFERENTIAL
Basophils Absolute Automated: 0.02 10*3/uL (ref 0.00–0.20)
Basophils Automated: 0 % (ref 0–2)
Eosinophils Absolute Automated: 0.03 10*3/uL (ref 0.00–0.70)
Eosinophils Automated: 0 % (ref 0–5)
Hematocrit: 38 % (ref 37.0–47.0)
Hgb: 13.2 g/dL (ref 12.0–16.0)
Immature Granulocytes Absolute: 0.02 10*3/uL
Immature Granulocytes: 0 % (ref 0–1)
Lymphocytes Absolute Automated: 2.79 10*3/uL (ref 0.50–4.40)
Lymphocytes Automated: 25 % (ref 15–41)
MCH: 28.9 pg (ref 28.0–32.0)
MCHC: 34.7 g/dL (ref 32.0–36.0)
MCV: 83.3 fL (ref 80.0–100.0)
MPV: 9.2 fL — ABNORMAL LOW (ref 9.4–12.3)
Monocytes Absolute Automated: 0.68 10*3/uL (ref 0.00–1.20)
Monocytes: 6 % (ref 0–11)
Neutrophils Absolute: 7.48 10*3/uL (ref 1.80–8.10)
Neutrophils: 68 % (ref 52–75)
Nucleated RBC: 0 /100 WBC
Platelets: 413 10*3/uL — ABNORMAL HIGH (ref 140–400)
RBC: 4.56 10*6/uL (ref 4.20–5.40)
RDW: 14 % (ref 12–15)
WBC: 11.02 10*3/uL — ABNORMAL HIGH (ref 3.50–10.80)

## 2011-04-25 LAB — URINALYSIS WITH MICROSCOPIC
Bilirubin, UA: NEGATIVE
Blood, UA: NEGATIVE
Glucose, UA: NEGATIVE
Ketones UA: 40 — AB
Leukocyte Esterase, UA: NEGATIVE
Nitrite, UA: NEGATIVE
Protein, UR: 100 — AB
Specific Gravity UA POCT: 1.024 (ref 1.001–1.035)
Urine pH: 8 (ref 5.0–8.0)
Urobilinogen, UA: 2 mg/dL

## 2011-04-25 LAB — BASIC METABOLIC PANEL
BUN: 9 mg/dL (ref 8–20)
CO2: 29 mEq/L (ref 21–30)
Calcium: 9.7 mg/dL (ref 8.6–10.2)
Chloride: 88 mEq/L — ABNORMAL LOW (ref 98–107)
Creatinine: 0.4 mg/dL — ABNORMAL LOW (ref 0.6–1.5)
Glucose: 110 mg/dL — ABNORMAL HIGH (ref 70–100)
Potassium: 2.8 mEq/L — ABNORMAL LOW (ref 3.6–5.0)
Sodium: 135 mEq/L — ABNORMAL LOW (ref 136–146)

## 2011-04-25 LAB — URIC ACID: Uric acid: 6.9 mg/dL — ABNORMAL HIGH (ref 2.3–6.1)

## 2011-04-25 LAB — GFR: EGFR: >60

## 2011-04-26 LAB — BASIC METABOLIC PANEL
BUN: 3 mg/dL — ABNORMAL LOW (ref 8–20)
CO2: 31 mEq/L — ABNORMAL HIGH (ref 21–30)
Calcium: 8.5 mg/dL — ABNORMAL LOW (ref 8.6–10.2)
Chloride: 95 mEq/L — ABNORMAL LOW (ref 98–107)
Creatinine: 0.4 mg/dL — ABNORMAL LOW (ref 0.6–1.5)
Glucose: 98 mg/dL (ref 70–100)
Potassium: 4.2 mEq/L (ref 3.6–5.0)
Sodium: 133 mEq/L — ABNORMAL LOW (ref 136–146)

## 2011-04-26 LAB — CREATININE, SERUM: Creatinine: 0.4 mg/dL — ABNORMAL LOW (ref 0.6–1.5)

## 2011-04-26 LAB — CBC
Hematocrit: 29.8 % — ABNORMAL LOW (ref 37.0–47.0)
Hgb: 10.2 g/dL — ABNORMAL LOW (ref 12.0–16.0)
MCH: 28.6 pg (ref 28.0–32.0)
MCHC: 34.2 g/dL (ref 32.0–36.0)
MCV: 83.5 fL (ref 80.0–100.0)
MPV: 8.8 fL — ABNORMAL LOW (ref 9.4–12.3)
Nucleated RBC: 0 /100 WBC
Platelets: 333 10*3/uL (ref 140–400)
RBC: 3.57 10*6/uL — ABNORMAL LOW (ref 4.20–5.40)
RDW: 14 % (ref 12–15)
WBC: 7.81 10*3/uL (ref 3.50–10.80)

## 2011-04-26 LAB — GFR
EGFR: >60
EGFR: >60

## 2011-04-26 LAB — ALT: ALT: 18 U/L (ref 3–36)

## 2011-04-26 LAB — URIC ACID: Uric acid: 6.6 mg/dL — ABNORMAL HIGH (ref 2.3–6.1)

## 2011-04-26 LAB — AST: AST (SGOT): 21 U/L (ref 10–41)

## 2011-04-26 LAB — LACTATE DEHYDROGENASE: LDH: 325 U/L (ref 307–575)

## 2011-04-27 LAB — AST: AST (SGOT): 18 U/L (ref 10–41)

## 2011-04-27 LAB — BASIC METABOLIC PANEL
BUN: 4 mg/dL — ABNORMAL LOW (ref 8–20)
CO2: 29 mEq/L (ref 21–30)
Calcium: 8 mg/dL — ABNORMAL LOW (ref 8.6–10.2)
Chloride: 98 mEq/L (ref 98–107)
Creatinine: 0.4 mg/dL — ABNORMAL LOW (ref 0.6–1.5)
Glucose: 59 mg/dL — ABNORMAL LOW (ref 70–100)
Potassium: 3.2 mEq/L — ABNORMAL LOW (ref 3.6–5.0)
Sodium: 135 mEq/L — ABNORMAL LOW (ref 136–146)

## 2011-04-27 LAB — CBC
Hematocrit: 29.7 % — ABNORMAL LOW (ref 37.0–47.0)
Hgb: 10.1 g/dL — ABNORMAL LOW (ref 12.0–16.0)
MCH: 28.6 pg (ref 28.0–32.0)
MCHC: 34 g/dL (ref 32.0–36.0)
MCV: 84.1 fL (ref 80.0–100.0)
MPV: 8.8 fL — ABNORMAL LOW (ref 9.4–12.3)
Nucleated RBC: 0 /100 WBC
Platelets: 323 10*3/uL (ref 140–400)
RBC: 3.53 10*6/uL — ABNORMAL LOW (ref 4.20–5.40)
RDW: 14 % (ref 12–15)
WBC: 10.45 10*3/uL (ref 3.50–10.80)

## 2011-04-27 LAB — ALT: ALT: 20 U/L (ref 3–36)

## 2011-04-27 LAB — URINE CULTURE: Urine Culture, Routine: NO GROWTH

## 2011-04-27 LAB — GFR: EGFR: >60

## 2011-04-27 LAB — PROTEIN, URINE, 24 HOUR
Protein 24HR, UR: 118.9 mg/24 hr (ref 0.0–299.0)
Urine Volume:: 1675 mL

## 2011-04-27 LAB — LACTATE DEHYDROGENASE: LDH: 330 U/L (ref 307–575)

## 2011-04-27 LAB — CREATININE CLEARANCE
Body Surface Area:: 1.38
Creat Clearance Hours Collected: 24 Hr
Creat Clearance-Serum Creatinine: 0.5 mg/dL
Patient Height (In): 60 [in_us]
Urine Creat Clearance Calculation: 96 mL/min (ref 66–165)
Weight: 100 lb

## 2011-04-27 LAB — URIC ACID: Uric acid: 4.5 mg/dL (ref 2.3–6.1)

## 2011-04-28 LAB — BASIC METABOLIC PANEL
BUN: 4 mg/dL — ABNORMAL LOW (ref 8–20)
BUN: 3 mg/dL — ABNORMAL LOW (ref 8–20)
CO2: 26 mEq/L (ref 21–30)
CO2: 26 mEq/L (ref 21–30)
Calcium: 7.6 mg/dL — ABNORMAL LOW (ref 8.6–10.2)
Calcium: 8.4 mg/dL — ABNORMAL LOW (ref 8.6–10.2)
Chloride: 104 mEq/L (ref 98–107)
Chloride: 101 mEq/L (ref 98–107)
Creatinine: 0.4 mg/dL — ABNORMAL LOW (ref 0.6–1.5)
Creatinine: 0.3 mg/dL — ABNORMAL LOW (ref 0.6–1.5)
Glucose: 59 mg/dL — ABNORMAL LOW (ref 70–100)
Glucose: 31 mg/dL — CL (ref 70–100)
Potassium: 4.1 mEq/L (ref 3.6–5.0)
Potassium: 4 mEq/L (ref 3.6–5.0)
Sodium: 134 mEq/L — ABNORMAL LOW (ref 136–146)
Sodium: 134 mEq/L — ABNORMAL LOW (ref 136–146)

## 2011-04-28 LAB — URINALYSIS WITH MICROSCOPIC
Bilirubin, UA: NEGATIVE
Blood, UA: NEGATIVE
Glucose, UA: NEGATIVE
Ketones UA: NEGATIVE
Leukocyte Esterase, UA: NEGATIVE
Nitrite, UA: NEGATIVE
Protein, UR: NEGATIVE
Specific Gravity UA POCT: 1.009 (ref 1.001–1.035)
Urine pH: 7.5 (ref 5.0–8.0)
Urobilinogen, UA: NORMAL mg/dL

## 2011-04-28 LAB — GFR
EGFR: >60
EGFR: >60

## 2011-04-28 LAB — RAPID DRUG SCREEN, URINE
Barbiturate Screen, UR: NOT DETECTED
Benzodiazepine Screen, UR: NOT DETECTED
Cannabinoid Screen, UR: NOT DETECTED
Cocaine, UR: NOT DETECTED
Opiate Screen, UR: DETECTED — AB
PCP Screen, UR: NOT DETECTED
Urine Amphetamine Screen: NOT DETECTED

## 2011-04-29 LAB — CBC AND DIFFERENTIAL
Basophils Absolute Automated: 0.02 10*3/uL (ref 0.00–0.20)
Basophils Automated: 0 % (ref 0–2)
Eosinophils Absolute Automated: 0.06 10*3/uL (ref 0.00–0.70)
Eosinophils Automated: 1 % (ref 0–5)
Hematocrit: 27.2 % — ABNORMAL LOW (ref 37.0–47.0)
Hgb: 9 g/dL — ABNORMAL LOW (ref 12.0–16.0)
Immature Granulocytes Absolute: 0.02 10*3/uL
Immature Granulocytes: 0 % (ref 0–1)
Lymphocytes Absolute Automated: 2.03 10*3/uL (ref 0.50–4.40)
Lymphocytes Automated: 29 % (ref 15–41)
MCH: 28.1 pg (ref 28.0–32.0)
MCHC: 33.1 g/dL (ref 32.0–36.0)
MCV: 85 fL (ref 80.0–100.0)
MPV: 9.2 fL — ABNORMAL LOW (ref 9.4–12.3)
Monocytes Absolute Automated: 0.39 10*3/uL (ref 0.00–1.20)
Monocytes: 6 % (ref 0–11)
Neutrophils Absolute: 4.54 10*3/uL (ref 1.80–8.10)
Neutrophils: 64 % (ref 52–75)
Nucleated RBC: 0 /100 WBC
Platelets: 305 10*3/uL (ref 140–400)
RBC: 3.2 10*6/uL — ABNORMAL LOW (ref 4.20–5.40)
RDW: 14 % (ref 12–15)
WBC: 7.06 10*3/uL (ref 3.50–10.80)

## 2011-04-29 LAB — BASIC METABOLIC PANEL
BUN: 4 mg/dL — ABNORMAL LOW (ref 8–20)
CO2: 24 mEq/L (ref 21–30)
Calcium: 7.9 mg/dL — ABNORMAL LOW (ref 8.6–10.2)
Chloride: 104 mEq/L (ref 98–107)
Creatinine: 0.3 mg/dL — ABNORMAL LOW (ref 0.6–1.5)
Glucose: 79 mg/dL (ref 70–100)
Potassium: 3.5 mEq/L — ABNORMAL LOW (ref 3.6–5.0)
Sodium: 135 mEq/L — ABNORMAL LOW (ref 136–146)

## 2011-04-29 LAB — MAGNESIUM: Magnesium: 1.4 mg/dL — ABNORMAL LOW (ref 1.6–2.3)

## 2011-04-29 LAB — LACTATE DEHYDROGENASE: LDH: 391 U/L (ref 307–575)

## 2011-04-29 LAB — URIC ACID: Uric acid: 2.8 mg/dL (ref 2.3–6.1)

## 2011-04-29 LAB — CORTISOL: Cortisol: 31.4 ug/dL

## 2011-04-29 LAB — ALT: ALT: 21 U/L (ref 3–36)

## 2011-04-29 LAB — AST: AST (SGOT): 18 U/L (ref 10–41)

## 2011-04-29 LAB — GFR: EGFR: >60

## 2011-04-30 LAB — ALDOSTERONE - SOFT: Aldosterone, LC/MS/MS: <1 ng/dL

## 2011-04-30 LAB — BASIC METABOLIC PANEL
BUN: 6 mg/dL — ABNORMAL LOW (ref 8–20)
CO2: 21 mEq/L (ref 21–30)
Calcium: 7.6 mg/dL — ABNORMAL LOW (ref 8.6–10.2)
Chloride: 106 mEq/L (ref 98–107)
Creatinine: 0.4 mg/dL — ABNORMAL LOW (ref 0.6–1.5)
Glucose: 75 mg/dL (ref 70–100)
Potassium: 4.5 mEq/L (ref 3.6–5.0)
Sodium: 133 mEq/L — ABNORMAL LOW (ref 136–146)

## 2011-04-30 LAB — GFR: EGFR: >60

## 2011-04-30 LAB — MAGNESIUM: Magnesium: 1.6 mg/dL (ref 1.6–2.3)

## 2011-05-01 LAB — URINE METANEPHRINES 24 HOUR
Metanephrine: 55 mcg/24 h (ref 25–222)
Metanephrines, Total: 200 mcg/24 h (ref 94–604)
Normetanephrine: 145 mcg/24 h (ref 40–412)
Urine Total Volume: 2500 mL

## 2011-05-01 LAB — BASIC METABOLIC PANEL
BUN: 4 mg/dL — ABNORMAL LOW (ref 8–20)
CO2: 23 mEq/L (ref 21–30)
Calcium: 8.1 mg/dL — ABNORMAL LOW (ref 8.6–10.2)
Chloride: 104 mEq/L (ref 98–107)
Creatinine: 0.3 mg/dL — ABNORMAL LOW (ref 0.6–1.5)
Glucose: 71 mg/dL (ref 70–100)
Potassium: 3.9 mEq/L (ref 3.6–5.0)
Sodium: 133 mEq/L — ABNORMAL LOW (ref 136–146)

## 2011-05-01 LAB — URINE CATECHOLAMINES FRACTIONATED 24 HR
24HR, UR vol.: 2500 mL/24 h
Calculated Total (E+NE): 23 mcg/24 h — ABNORMAL LOW (ref 26–121)
Dopamine, 24 hr Urine: 50 mcg/24 h — ABNORMAL LOW (ref 52–480)
Epinephrine 24HR, UR: 6 mcg/24 h (ref 2–24)
Norepinephrine, 24 hr Ur: 17 mcg/24 h (ref 15–100)

## 2011-05-01 LAB — GFR: EGFR: >60

## 2011-05-02 LAB — PLASMA RENIN ACTIVITY: PRA, LC/MS/MS: 3.46 ng/mL/h (ref 0.25–5.82)

## 2011-05-17 ENCOUNTER — Inpatient Hospital Stay (HOSPITAL_BASED_OUTPATIENT_CLINIC_OR_DEPARTMENT_OTHER)
Admission: AD | Admit: 2011-05-17 | Disposition: A | Discharge status: Home / Self Care | Payer: Self-pay | Source: Ambulatory Visit

## 2011-05-17 LAB — GFR: EGFR: >60

## 2011-05-17 LAB — URINALYSIS WITH MICROSCOPIC
Bilirubin, UA: NEGATIVE
Blood, UA: NEGATIVE
Glucose, UA: NEGATIVE
Ketones UA: NEGATIVE
Nitrite, UA: NEGATIVE
Protein, UR: 100 — AB
Specific Gravity UA POCT: 1.005 (ref 1.001–1.035)
Urine pH: 9 — AB (ref 5.0–8.0)
Urobilinogen, UA: 2 mg/dL

## 2011-05-17 LAB — PT/INR
PT INR: 1 (ref 0.9–1.1)
PT: 13.2 s (ref 12.6–15.0)

## 2011-05-17 LAB — CBC AND DIFFERENTIAL
Basophils Absolute Automated: 0.02 10*3/uL (ref 0.00–0.20)
Basophils Automated: 0 % (ref 0–2)
Eosinophils Absolute Automated: 0.05 10*3/uL (ref 0.00–0.70)
Eosinophils Automated: 0 % (ref 0–5)
Hematocrit: 32.2 % — ABNORMAL LOW (ref 37.0–47.0)
Hgb: 11.2 g/dL — ABNORMAL LOW (ref 12.0–16.0)
Immature Granulocytes Absolute: 0.02 10*3/uL
Immature Granulocytes: 0 % (ref 0–1)
Lymphocytes Absolute Automated: 2.89 10*3/uL (ref 0.50–4.40)
Lymphocytes Automated: 28 % (ref 15–41)
MCH: 29.1 pg (ref 28.0–32.0)
MCHC: 34.8 g/dL (ref 32.0–36.0)
MCV: 83.6 fL (ref 80.0–100.0)
MPV: 8.9 fL — ABNORMAL LOW (ref 9.4–12.3)
Monocytes Absolute Automated: 0.74 10*3/uL (ref 0.00–1.20)
Monocytes: 7 % (ref 0–11)
Neutrophils Absolute: 6.61 10*3/uL (ref 1.80–8.10)
Neutrophils: 64 % (ref 52–75)
Nucleated RBC: 0 /100 WBC
Platelets: 395 10*3/uL (ref 140–400)
RBC: 3.85 10*6/uL — ABNORMAL LOW (ref 4.20–5.40)
RDW: 13 % (ref 12–15)
WBC: 10.33 10*3/uL (ref 3.50–10.80)

## 2011-05-17 LAB — BASIC METABOLIC PANEL
BUN: 9 mg/dL (ref 8–20)
CO2: 29 mEq/L (ref 21–30)
Calcium: 9.5 mg/dL (ref 8.6–10.2)
Chloride: 92 mEq/L — ABNORMAL LOW (ref 98–107)
Creatinine: 0.4 mg/dL — ABNORMAL LOW (ref 0.6–1.5)
Glucose: 123 mg/dL — ABNORMAL HIGH (ref 70–100)
Potassium: 3.2 mEq/L — ABNORMAL LOW (ref 3.6–5.0)
Sodium: 133 mEq/L — ABNORMAL LOW (ref 136–146)

## 2011-05-17 LAB — LACTATE DEHYDROGENASE: LDH: 468 U/L (ref 307–575)

## 2011-05-17 LAB — FIBRINOGEN: Fibrinogen: 470 mg/dL — ABNORMAL HIGH (ref 189–458)

## 2011-05-17 LAB — APTT: PTT: 30 s (ref 23–37)

## 2011-05-17 LAB — AST: AST (SGOT): 26 U/L (ref 10–41)

## 2011-05-17 LAB — ALBUMIN: Albumin: 4.1 g/dL (ref 3.4–4.9)

## 2011-05-17 LAB — ALT: ALT: 26 U/L (ref 3–36)

## 2011-05-17 LAB — URIC ACID: Uric acid: 4.7 mg/dL (ref 2.3–6.1)

## 2011-05-17 LAB — FETAL FIBRONECTIN: Fetal Fibronectin: NEGATIVE

## 2011-05-18 LAB — CBC
Hematocrit: 27 % — ABNORMAL LOW (ref 37.0–47.0)
Hgb: 9.2 g/dL — ABNORMAL LOW (ref 12.0–16.0)
MCH: 28.8 pg (ref 28.0–32.0)
MCHC: 34.1 g/dL (ref 32.0–36.0)
MCV: 84.4 fL (ref 80.0–100.0)
MPV: 8.6 fL — ABNORMAL LOW (ref 9.4–12.3)
Nucleated RBC: 0 /100 WBC
Platelets: 346 10*3/uL (ref 140–400)
RBC: 3.2 10*6/uL — ABNORMAL LOW (ref 4.20–5.40)
RDW: 13 % (ref 12–15)
WBC: 8.1 10*3/uL (ref 3.50–10.80)

## 2011-05-18 LAB — URIC ACID: Uric acid: 4.7 mg/dL (ref 2.3–6.1)

## 2011-05-18 LAB — TYPE AND SCREEN
AB Screen Gel: NEGATIVE
ABO Rh: O POS

## 2011-05-18 LAB — AST: AST (SGOT): 25 U/L (ref 10–41)

## 2011-05-18 LAB — GROUP B STREP INTRAPARTUM PCR: Group B Strep Intrapartum PCR: NEGATIVE

## 2011-05-18 LAB — ALBUMIN: Albumin: 3 g/dL — ABNORMAL LOW (ref 3.4–4.9)

## 2011-05-18 LAB — GFR: EGFR: >60

## 2011-05-18 LAB — LACTATE DEHYDROGENASE: LDH: 359 U/L (ref 307–575)

## 2011-05-18 LAB — CREATININE, SERUM: Creatinine: 0.3 mg/dL — ABNORMAL LOW (ref 0.6–1.5)

## 2011-05-18 LAB — ALT: ALT: 27 U/L (ref 3–36)

## 2011-05-19 LAB — CHLAMYDIA/NEISSERIA BY PCR: Chlamydia/GC by PCR: NEGATIVE

## 2011-05-19 LAB — CBC
Hematocrit: 26.5 % — ABNORMAL LOW (ref 37.0–47.0)
Hgb: 8.9 g/dL — ABNORMAL LOW (ref 12.0–16.0)
MCH: 28.5 pg (ref 28.0–32.0)
MCHC: 33.6 g/dL (ref 32.0–36.0)
MCV: 84.9 fL (ref 80.0–100.0)
MPV: 8.6 fL — ABNORMAL LOW (ref 9.4–12.3)
Nucleated RBC: 0 /100 WBC
Platelets: 315 10*3/uL (ref 140–400)
RBC: 3.12 10*6/uL — ABNORMAL LOW (ref 4.20–5.40)
RDW: 13 % (ref 12–15)
WBC: 6.64 10*3/uL (ref 3.50–10.80)

## 2011-05-19 LAB — CREATININE, SERUM: Creatinine: 0.3 mg/dL — ABNORMAL LOW (ref 0.6–1.5)

## 2011-05-19 LAB — PROTEIN, URINE, 24 HOUR
Protein 24HR, UR: 142.5 mg/24 hr (ref 0.0–299.0)
Urine Volume:: 950 mL

## 2011-05-19 LAB — AST: AST (SGOT): 19 U/L (ref 10–41)

## 2011-05-19 LAB — URIC ACID: Uric acid: 4.2 mg/dL (ref 2.3–6.1)

## 2011-05-19 LAB — ALT: ALT: 19 U/L (ref 3–36)

## 2011-05-19 LAB — URINE CULTURE

## 2011-05-19 LAB — GFR: EGFR: >60

## 2011-05-19 LAB — LACTATE DEHYDROGENASE: LDH: 307 U/L (ref 307–575)

## 2011-05-20 LAB — AST: AST (SGOT): 17 U/L (ref 10–41)

## 2011-05-20 LAB — CBC
Hematocrit: 24.3 % — ABNORMAL LOW (ref 37.0–47.0)
Hgb: 8.2 g/dL — ABNORMAL LOW (ref 12.0–16.0)
MCH: 29 pg (ref 28.0–32.0)
MCHC: 33.7 g/dL (ref 32.0–36.0)
MCV: 85.9 fL (ref 80.0–100.0)
MPV: 8.6 fL — ABNORMAL LOW (ref 9.4–12.3)
Nucleated RBC: 0 /100 WBC
Platelets: 289 10*3/uL (ref 140–400)
RBC: 2.83 10*6/uL — ABNORMAL LOW (ref 4.20–5.40)
RDW: 13 % (ref 12–15)
WBC: 6.32 10*3/uL (ref 3.50–10.80)

## 2011-05-20 LAB — ALT: ALT: 19 U/L (ref 3–36)

## 2011-05-20 LAB — URIC ACID: Uric acid: 3.5 mg/dL (ref 2.3–6.1)

## 2011-05-20 LAB — CREATININE, SERUM: Creatinine: 0.3 mg/dL — ABNORMAL LOW (ref 0.6–1.5)

## 2011-05-20 LAB — GFR: EGFR: >60

## 2011-05-20 LAB — LACTATE DEHYDROGENASE: LDH: 338 U/L (ref 307–575)

## 2011-05-21 LAB — MYCOPLASMA / UREAPLASMA CULTURE: Culture Ureaplasma: NEGATIVE

## 2011-05-28 ENCOUNTER — Observation Stay (HOSPITAL_BASED_OUTPATIENT_CLINIC_OR_DEPARTMENT_OTHER)
Admission: AD | Admit: 2011-05-28 | Disposition: A | Discharge status: Home / Self Care | Payer: Self-pay | Source: Ambulatory Visit

## 2011-05-28 LAB — CBC AND DIFFERENTIAL
Basophils Absolute Automated: 0.02 10*3/uL (ref 0.00–0.20)
Basophils Automated: 0 % (ref 0–2)
Eosinophils Absolute Automated: 0.05 10*3/uL (ref 0.00–0.70)
Eosinophils Automated: 0 % (ref 0–5)
Hematocrit: 31.3 % — ABNORMAL LOW (ref 37.0–47.0)
Hgb: 10.4 g/dL — ABNORMAL LOW (ref 12.0–16.0)
Immature Granulocytes Absolute: 0.01 10*3/uL
Immature Granulocytes: 0 % (ref 0–1)
Lymphocytes Absolute Automated: 2.26 10*3/uL (ref 0.50–4.40)
Lymphocytes Automated: 23 % (ref 15–41)
MCH: 28.3 pg (ref 28.0–32.0)
MCHC: 33.2 g/dL (ref 32.0–36.0)
MCV: 85.1 fL (ref 80.0–100.0)
MPV: 8.8 fL — ABNORMAL LOW (ref 9.4–12.3)
Monocytes Absolute Automated: 0.51 10*3/uL (ref 0.00–1.20)
Monocytes: 5 % (ref 0–11)
Neutrophils Absolute: 7.04 10*3/uL (ref 1.80–8.10)
Neutrophils: 71 % (ref 52–75)
Nucleated RBC: 0 /100 WBC
Platelets: 401 10*3/uL — ABNORMAL HIGH (ref 140–400)
RBC: 3.68 10*6/uL — ABNORMAL LOW (ref 4.20–5.40)
RDW: 13 % (ref 12–15)
WBC: 9.89 10*3/uL (ref 3.50–10.80)

## 2011-05-28 LAB — URINALYSIS WITH MICROSCOPIC
Bilirubin, UA: NEGATIVE
Blood, UA: NEGATIVE
Glucose, UA: NEGATIVE
Ketones UA: >=80 — AB
Leukocyte Esterase, UA: NEGATIVE
Nitrite, UA: NEGATIVE
Protein, UR: 300 — AB
Specific Gravity UA POCT: 1.019 (ref 1.001–1.035)
Urine pH: 8.5 — AB (ref 5.0–8.0)
Urobilinogen, UA: NORMAL mg/dL

## 2011-05-28 LAB — COMPREHENSIVE METABOLIC PANEL
ALT: 20 U/L (ref 3–36)
AST (SGOT): 22 U/L (ref 10–41)
Albumin/Globulin Ratio: 1.1 (ref 1.1–1.8)
Albumin: 3.7 g/dL (ref 3.4–4.9)
Alkaline Phosphatase: 102 U/L (ref 43–112)
BUN: 6 mg/dL — ABNORMAL LOW (ref 8–20)
Bilirubin, Total: 0.7 mg/dL (ref 0.1–1.0)
CO2: 26 mEq/L (ref 21–30)
Calcium: 9 mg/dL (ref 8.6–10.2)
Chloride: 99 mEq/L (ref 98–107)
Creatinine: 0.3 mg/dL — ABNORMAL LOW (ref 0.6–1.5)
Globulin: 3.4 g/dL (ref 2.0–3.7)
Glucose: 101 mg/dL — ABNORMAL HIGH (ref 70–100)
Potassium: 3.7 mEq/L (ref 3.6–5.0)
Protein, Total: 7.1 g/dL (ref 6.0–8.0)
Sodium: 136 mEq/L (ref 136–146)

## 2011-05-28 LAB — GFR: EGFR: >60

## 2011-05-28 LAB — LACTATE DEHYDROGENASE: LDH: 430 U/L (ref 307–575)

## 2011-05-28 LAB — URIC ACID: Uric acid: 4.4 mg/dL (ref 2.3–6.1)

## 2011-05-30 LAB — URINE CULTURE

## 2011-06-01 ENCOUNTER — Ambulatory Visit (INDEPENDENT_AMBULATORY_CARE_PROVIDER_SITE_OTHER): Payer: Self-pay | Admitting: Maternal & Fetal Medicine

## 2011-06-01 VITALS — BP 136/93 | Wt 103.7 lb

## 2011-06-01 DIAGNOSIS — O24919 Unspecified diabetes mellitus in pregnancy, unspecified trimester: Secondary | ICD-10-CM

## 2011-06-01 DIAGNOSIS — Z9889 Other specified postprocedural states: Secondary | ICD-10-CM

## 2011-06-01 DIAGNOSIS — IMO0002 Reserved for concepts with insufficient information to code with codable children: Secondary | ICD-10-CM

## 2011-06-01 DIAGNOSIS — Z98891 History of uterine scar from previous surgery: Secondary | ICD-10-CM

## 2011-06-01 DIAGNOSIS — O09899 Supervision of other high risk pregnancies, unspecified trimester: Secondary | ICD-10-CM

## 2011-06-01 LAB — POCT PROTEIN, URINE, QUALITATIVE, DIPSTICK

## 2011-06-01 LAB — POCT GLUCOSE, URINE, QUALITATIVE, DIPSTICK: Glucose, UA: NEGATIVE

## 2011-06-01 NOTE — Progress Notes (Signed)
Pt. Is complaining of headache, blurry vision, nausea, and  body pain; mostly lower back pain.

## 2011-06-01 NOTE — Progress Notes (Signed)
Patient with chronic back pain taking neurontin  No blood sugar log  No suppression therapy for pyelo for the last 3 weeks  Patient for RLTCS and BTL

## 2011-06-09 ENCOUNTER — Inpatient Hospital Stay (HOSPITAL_BASED_OUTPATIENT_CLINIC_OR_DEPARTMENT_OTHER)
Admission: RE | Admit: 2011-06-09 | Disposition: A | Discharge status: Home / Self Care | Payer: Self-pay | Source: Ambulatory Visit

## 2011-06-09 LAB — CBC
Hematocrit: 35.1 % — ABNORMAL LOW (ref 37.0–47.0)
Hgb: 12.4 g/dL (ref 12.0–16.0)
MCH: 29 pg (ref 28.0–32.0)
MCHC: 35.3 g/dL (ref 32.0–36.0)
MCV: 82.2 fL (ref 80.0–100.0)
MPV: 9.5 fL (ref 9.4–12.3)
Nucleated RBC: 0 /100 WBC
Platelets: 451 10*3/uL — ABNORMAL HIGH (ref 140–400)
RBC: 4.27 10*6/uL (ref 4.20–5.40)
RDW: 13 % (ref 12–15)
WBC: 9.56 10*3/uL (ref 3.50–10.80)

## 2011-06-09 LAB — URIC ACID: Uric acid: 6.3 mg/dL — ABNORMAL HIGH (ref 2.3–6.1)

## 2011-06-09 LAB — COMPREHENSIVE METABOLIC PANEL
ALT: 24 U/L (ref 3–36)
AST (SGOT): 31 U/L (ref 10–41)
Albumin/Globulin Ratio: 1.1 (ref 1.1–1.8)
Albumin: 4.4 g/dL (ref 3.4–4.9)
Alkaline Phosphatase: 163 U/L — ABNORMAL HIGH (ref 43–112)
BUN: 10 mg/dL (ref 8–20)
Bilirubin, Total: 0.9 mg/dL (ref 0.1–1.0)
CO2: 30 mEq/L (ref 21–30)
Calcium: 9.7 mg/dL (ref 8.6–10.2)
Chloride: 87 mEq/L — ABNORMAL LOW (ref 98–107)
Creatinine: 0.4 mg/dL — ABNORMAL LOW (ref 0.6–1.5)
Globulin: 3.9 g/dL — ABNORMAL HIGH (ref 2.0–3.7)
Glucose: 163 mg/dL — ABNORMAL HIGH (ref 70–100)
Potassium: 3.9 mEq/L (ref 3.6–5.0)
Protein, Total: 8.3 g/dL — ABNORMAL HIGH (ref 6.0–8.0)
Sodium: 133 mEq/L — ABNORMAL LOW (ref 136–146)

## 2011-06-09 LAB — URINALYSIS, REFLEX TO MICROSCOPIC EXAM IF INDICATED
Bilirubin, UA: NEGATIVE
Blood, UA: NEGATIVE
Glucose, UA: 70
Ketones UA: 40 — AB
Nitrite, UA: NEGATIVE
Protein, UR: 300 — AB
Specific Gravity UA POCT: 1.02 (ref 1.001–1.035)
Urine pH: 8.5 — AB (ref 5.0–8.0)
Urobilinogen, UA: 2 mg/dL

## 2011-06-09 LAB — TYPE AND SCREEN
AB Screen Gel: NEGATIVE
ABO Rh: O POS

## 2011-06-09 LAB — GFR: EGFR: >60

## 2011-06-11 LAB — CBC
Hematocrit: 23.8 % — ABNORMAL LOW (ref 37.0–47.0)
Hgb: 8 g/dL — ABNORMAL LOW (ref 12.0–16.0)
MCH: 28.3 pg (ref 28.0–32.0)
MCHC: 33.6 g/dL (ref 32.0–36.0)
MCV: 84.1 fL (ref 80.0–100.0)
MPV: 8.5 fL — ABNORMAL LOW (ref 9.4–12.3)
Nucleated RBC: 0 /100 WBC
Platelets: 317 10*3/uL (ref 140–400)
RBC: 2.83 10*6/uL — ABNORMAL LOW (ref 4.20–5.40)
RDW: 13 % (ref 12–15)
WBC: 5.78 10*3/uL (ref 3.50–10.80)

## 2011-06-11 LAB — URIC ACID: Uric acid: 5.7 mg/dL (ref 2.3–6.1)

## 2011-06-11 LAB — AST: AST (SGOT): 19 U/L (ref 10–41)

## 2011-06-11 LAB — GFR: EGFR: >60

## 2011-06-11 LAB — CREATININE, SERUM: Creatinine: 0.4 mg/dL — ABNORMAL LOW (ref 0.6–1.5)

## 2011-06-11 LAB — LACTATE DEHYDROGENASE: LDH: 316 U/L (ref 307–575)

## 2011-06-11 LAB — ALT: ALT: 23 U/L (ref 3–36)

## 2011-06-11 LAB — URINE CULTURE: Urine Culture, Routine: NO GROWTH

## 2011-06-13 LAB — CBC AND DIFFERENTIAL
Basophils Absolute Automated: 0.02 10*3/uL (ref 0.00–0.20)
Basophils Automated: 0 % (ref 0–2)
Eosinophils Absolute Automated: 0.06 10*3/uL (ref 0.00–0.70)
Eosinophils Automated: 1 % (ref 0–5)
Hematocrit: 27.2 % — ABNORMAL LOW (ref 37.0–47.0)
Hgb: 8.9 g/dL — ABNORMAL LOW (ref 12.0–16.0)
Immature Granulocytes Absolute: 0.03 10*3/uL
Immature Granulocytes: 0 % (ref 0–1)
Lymphocytes Absolute Automated: 2.11 10*3/uL (ref 0.50–4.40)
Lymphocytes Automated: 20 % (ref 15–41)
MCH: 27.9 pg — ABNORMAL LOW (ref 28.0–32.0)
MCHC: 32.7 g/dL (ref 32.0–36.0)
MCV: 85.3 fL (ref 80.0–100.0)
MPV: 8.9 fL — ABNORMAL LOW (ref 9.4–12.3)
Monocytes Absolute Automated: 0.54 10*3/uL (ref 0.00–1.20)
Monocytes: 5 % (ref 0–11)
Neutrophils Absolute: 7.9 10*3/uL (ref 1.80–8.10)
Neutrophils: 74 % (ref 52–75)
Nucleated RBC: 0 /100 WBC
Platelets: 378 10*3/uL (ref 140–400)
RBC: 3.19 10*6/uL — ABNORMAL LOW (ref 4.20–5.40)
RDW: 13 % (ref 12–15)
WBC: 10.66 10*3/uL (ref 3.50–10.80)

## 2011-06-13 LAB — ALT: ALT: 18 U/L (ref 3–36)

## 2011-06-13 LAB — URINALYSIS, REFLEX TO MICROSCOPIC EXAM IF INDICATED
Bilirubin, UA: NEGATIVE
Blood, UA: NEGATIVE
Glucose, UA: NEGATIVE
Ketones UA: 10
Nitrite, UA: NEGATIVE
Specific Gravity UA POCT: 1.02 (ref 1.001–1.035)
Urine pH: 8 (ref 5.0–8.0)
Urobilinogen, UA: NORMAL mg/dL

## 2011-06-13 LAB — URIC ACID: Uric acid: 5.1 mg/dL (ref 2.3–6.1)

## 2011-06-13 LAB — AST: AST (SGOT): 23 U/L (ref 10–41)

## 2011-06-13 LAB — GFR: EGFR: >60

## 2011-06-13 LAB — LACTATE DEHYDROGENASE: LDH: 477 U/L (ref 307–575)

## 2011-06-13 LAB — LIPASE: Lipase: 61 U/L (ref 32–219)

## 2011-06-13 LAB — AMYLASE: Amylase: 106 U/L — ABNORMAL HIGH (ref 0–90)

## 2011-06-13 LAB — CREATININE, SERUM: Creatinine: 0.4 mg/dL — ABNORMAL LOW (ref 0.6–1.5)

## 2011-06-14 LAB — CREATININE, SERUM: Creatinine: 0.5 mg/dL — ABNORMAL LOW (ref 0.6–1.5)

## 2011-06-14 LAB — URIC ACID: Uric acid: 5 mg/dL (ref 2.3–6.1)

## 2011-06-14 LAB — CBC
Hematocrit: 24.9 % — ABNORMAL LOW (ref 37.0–47.0)
Hgb: 8.6 g/dL — ABNORMAL LOW (ref 12.0–16.0)
MCH: 29.4 pg (ref 28.0–32.0)
MCHC: 34.5 g/dL (ref 32.0–36.0)
MCV: 85 fL (ref 80.0–100.0)
MPV: 8.9 fL — ABNORMAL LOW (ref 9.4–12.3)
Nucleated RBC: 0 /100 WBC
Platelets: 341 10*3/uL (ref 140–400)
RBC: 2.93 10*6/uL — ABNORMAL LOW (ref 4.20–5.40)
RDW: 13 % (ref 12–15)
WBC: 8.02 10*3/uL (ref 3.50–10.80)

## 2011-06-14 LAB — LACTATE DEHYDROGENASE: LDH: 405 U/L (ref 307–575)

## 2011-06-14 LAB — ALT: ALT: 23 U/L (ref 3–36)

## 2011-06-14 LAB — AST: AST (SGOT): 19 U/L (ref 10–41)

## 2011-06-14 LAB — GFR: EGFR: >60

## 2011-06-15 ENCOUNTER — Encounter (INDEPENDENT_AMBULATORY_CARE_PROVIDER_SITE_OTHER): Payer: Self-pay | Admitting: Maternal & Fetal Medicine

## 2011-06-16 LAB — LACTATE DEHYDROGENASE: LDH: 359 U/L (ref 307–575)

## 2011-06-16 LAB — CBC
Hematocrit: 26 % — ABNORMAL LOW (ref 37.0–47.0)
Hgb: 9 g/dL — ABNORMAL LOW (ref 12.0–16.0)
MCH: 29.1 pg (ref 28.0–32.0)
MCHC: 34.6 g/dL (ref 32.0–36.0)
MCV: 84.1 fL (ref 80.0–100.0)
MPV: 8.8 fL — ABNORMAL LOW (ref 9.4–12.3)
Nucleated RBC: 0 /100 WBC
Platelets: 330 10*3/uL (ref 140–400)
RBC: 3.09 10*6/uL — ABNORMAL LOW (ref 4.20–5.40)
RDW: 13 % (ref 12–15)
WBC: 6.11 10*3/uL (ref 3.50–10.80)

## 2011-06-16 LAB — CREATININE CLEARANCE
Body Surface Area:: 1.43
Creat Clearance Hours Collected: 24 Hr
Creat Clearance-Serum Creatinine: 0.5 mg/dL
Patient Height (In): 60 [in_us]
Urine Creat Clearance Calculation: 138 mL/min (ref 66–165)
Weight: 108 lb

## 2011-06-16 LAB — PROTEIN, URINE, 24 HOUR
Protein 24HR, UR: 452 mg/24 hr — ABNORMAL HIGH (ref 0.0–299.0)
Urine Volume:: 2000 mL

## 2011-06-16 LAB — URIC ACID: Uric acid: 4.1 mg/dL (ref 2.3–6.1)

## 2011-06-16 LAB — AST: AST (SGOT): 17 U/L (ref 10–41)

## 2011-06-16 LAB — ALT: ALT: 15 U/L (ref 3–36)

## 2011-06-16 LAB — GFR: EGFR: >60

## 2011-06-16 LAB — CREATININE, SERUM: Creatinine: 0.4 mg/dL — ABNORMAL LOW (ref 0.6–1.5)

## 2011-06-21 LAB — ECG 12-LEAD
Atrial Rate: 80 {beats}/min
P Axis: 30 degrees
P-R Interval: 156 ms
Q-T Interval: 392 ms
QRS Duration: 102 ms
QTC Calculation (Bezet): 452 ms
R Axis: 268 degrees
T Axis: 27 degrees
Ventricular Rate: 80 {beats}/min

## 2011-06-22 ENCOUNTER — Ambulatory Visit (INDEPENDENT_AMBULATORY_CARE_PROVIDER_SITE_OTHER): Payer: Self-pay | Admitting: Obstetrics

## 2011-06-22 DIAGNOSIS — O09899 Supervision of other high risk pregnancies, unspecified trimester: Secondary | ICD-10-CM

## 2011-06-22 DIAGNOSIS — IMO0002 Reserved for concepts with insufficient information to code with codable children: Secondary | ICD-10-CM

## 2011-06-22 DIAGNOSIS — N12 Tubulo-interstitial nephritis, not specified as acute or chronic: Secondary | ICD-10-CM

## 2011-06-22 DIAGNOSIS — O24919 Unspecified diabetes mellitus in pregnancy, unspecified trimester: Secondary | ICD-10-CM

## 2011-06-22 LAB — CBC
Hematocrit: 29.3 % — ABNORMAL LOW (ref 37.0–47.0)
Hgb: 9.7 g/dL — ABNORMAL LOW (ref 12.0–16.0)
MCH: 28.9 pg (ref 28.0–32.0)
MCHC: 33.1 g/dL (ref 32.0–36.0)
MCV: 87.2 fL (ref 80.0–100.0)
MPV: 9.2 fL — ABNORMAL LOW (ref 9.4–12.3)
Nucleated RBC: 0 /100 WBC
Platelets: 455 10*3/uL — ABNORMAL HIGH (ref 140–400)
RBC: 3.36 10*6/uL — ABNORMAL LOW (ref 4.20–5.40)
RDW: 14 % (ref 12–15)
WBC: 6.18 10*3/uL (ref 3.50–10.80)

## 2011-06-22 LAB — ALT: ALT: 10 U/L (ref 0–55)

## 2011-06-22 LAB — POCT PROTEIN, URINE, QUALITATIVE, DIPSTICK

## 2011-06-22 LAB — AST: AST (SGOT): 18 U/L (ref 5–34)

## 2011-06-22 LAB — GFR: EGFR: >60

## 2011-06-22 LAB — HEMOLYSIS INDEX: Hemolysis Index: 7 Index (ref 0–9)

## 2011-06-22 LAB — URIC ACID: Uric acid: 4.7 mg/dL (ref 2.4–6.0)

## 2011-06-22 LAB — POCT GLUCOSE, URINE, QUALITATIVE, DIPSTICK: Glucose, UA: NEGATIVE

## 2011-06-22 LAB — CREATININE, SERUM: Creatinine: 0.5 mg/dL (ref 0.4–1.5)

## 2011-06-22 NOTE — Progress Notes (Signed)
30yo [id number] at 32.2wk recently d/c'd from [facility] for epigastric pain, nausea and vomiting.  Found to have 24 protein of 452 but confounded by klebsiella UTI treated with abx.  Comp: GDMB, pyelo  -FS 90-101 fasting, 120-130s after meals (NPH 8 in AM/ Reg 2 breakfast, 2 dinner)  -c/o ongoing nausea, vomiting (2 episodes/day), lowback pain, low abd pain, and occ headaches  -Last sono 9/4 @ 30wk showing BPP of 8,  EFW 24%, breech  -Denies ctx, VB, LOF; +FM; Denies vision changes, urinary sx, RUQ pain, leg edema  -Order PIH labs, 24urine protein, add PM NPH 2units; f/u in 2 weeks

## 2011-06-22 NOTE — Progress Notes (Signed)
N/V  Recently treated for pyelo  Class B DM on insulin (abnl fasting glycemias)  Prior SVDx1  Prior CDx1    HELLP labs sent today  Patient to bring 24-hr urine collection on next visit    I have reviewed the notes, assessments, and procedures performed by Dr Pollyann Glen with his documentation of patient Kelsey Miles.

## 2011-06-23 LAB — ECG 12-LEAD
Atrial Rate: 83 {beats}/min
P Axis: 34 degrees
P-R Interval: 144 ms
Q-T Interval: 378 ms
QRS Duration: 90 ms
QTC Calculation (Bezet): 444 ms
R Axis: -60 degrees
T Axis: 35 degrees
Ventricular Rate: 83 {beats}/min

## 2011-06-24 LAB — ECG 12-LEAD
Atrial Rate: 112 {beats}/min
Atrial Rate: 79 {beats}/min
P Axis: 44 degrees
P Axis: 64 degrees
P-R Interval: 122 ms
P-R Interval: 144 ms
Q-T Interval: 364 ms
Q-T Interval: 408 ms
QRS Duration: 88 ms
QRS Duration: 98 ms
QTC Calculation (Bezet): 467 ms
QTC Calculation (Bezet): 496 ms
R Axis: -82 degrees
R Axis: 268 degrees
T Axis: 27 degrees
T Axis: 35 degrees
Ventricular Rate: 112 {beats}/min
Ventricular Rate: 79 {beats}/min

## 2011-06-26 ENCOUNTER — Observation Stay (HOSPITAL_BASED_OUTPATIENT_CLINIC_OR_DEPARTMENT_OTHER)
Admission: RE | Admit: 2011-06-26 | Disposition: A | Discharge status: Home / Self Care | Payer: Self-pay | Source: Ambulatory Visit

## 2011-06-26 LAB — COMPREHENSIVE METABOLIC PANEL
ALT: 15 U/L (ref 3–36)
AST (SGOT): 15 U/L (ref 10–41)
Albumin/Globulin Ratio: 1 — ABNORMAL LOW (ref 1.1–1.8)
Albumin: 2.9 g/dL — ABNORMAL LOW (ref 3.4–4.9)
Alkaline Phosphatase: 118 U/L — ABNORMAL HIGH (ref 43–112)
BUN: 4 mg/dL — ABNORMAL LOW (ref 8–20)
Bilirubin, Total: 0.5 mg/dL (ref 0.1–1.0)
CO2: 24 mEq/L (ref 21–30)
Calcium: 8.3 mg/dL — ABNORMAL LOW (ref 8.6–10.2)
Chloride: 103 mEq/L (ref 98–107)
Creatinine: 0.3 mg/dL — ABNORMAL LOW (ref 0.6–1.5)
Globulin: 2.8 g/dL (ref 2.0–3.7)
Glucose: 99 mg/dL (ref 70–100)
Potassium: 3.5 mEq/L — ABNORMAL LOW (ref 3.6–5.0)
Protein, Total: 5.7 g/dL — ABNORMAL LOW (ref 6.0–8.0)
Sodium: 135 mEq/L — ABNORMAL LOW (ref 136–146)

## 2011-06-26 LAB — CBC
Hematocrit: 31.6 % — ABNORMAL LOW (ref 37.0–47.0)
Hgb: 10.5 g/dL — ABNORMAL LOW (ref 12.0–16.0)
MCH: 28.4 pg (ref 28.0–32.0)
MCHC: 33.2 g/dL (ref 32.0–36.0)
MCV: 85.4 fL (ref 80.0–100.0)
MPV: 9.3 fL — ABNORMAL LOW (ref 9.4–12.3)
Nucleated RBC: 0 /100 WBC
Platelets: 413 10*3/uL — ABNORMAL HIGH (ref 140–400)
RBC: 3.7 10*6/uL — ABNORMAL LOW (ref 4.20–5.40)
RDW: 14 % (ref 12–15)
WBC: 8.57 10*3/uL (ref 3.50–10.80)

## 2011-06-26 LAB — URINALYSIS WITH MICROSCOPIC
Bilirubin, UA: NEGATIVE
Blood, UA: NEGATIVE
Glucose, UA: NEGATIVE
Ketones UA: NEGATIVE
Nitrite, UA: POSITIVE — AB
Protein, UR: 30 — AB
Specific Gravity UA POCT: 1.02 (ref 1.001–1.035)
Urine pH: 6.5 (ref 5.0–8.0)
Urobilinogen, UA: NORMAL mg/dL

## 2011-06-26 LAB — GFR: EGFR: >60

## 2011-06-28 LAB — URINE CULTURE

## 2011-07-04 ENCOUNTER — Inpatient Hospital Stay (HOSPITAL_BASED_OUTPATIENT_CLINIC_OR_DEPARTMENT_OTHER)
Admission: AD | Admit: 2011-07-04 | Disposition: A | Discharge status: Home / Self Care | Payer: Self-pay | Source: Ambulatory Visit

## 2011-07-04 LAB — URINALYSIS WITH MICROSCOPIC
Bilirubin, UA: NEGATIVE
Blood, UA: NEGATIVE
Glucose, UA: NEGATIVE
Ketones UA: 10
Nitrite, UA: POSITIVE — AB
Protein, UR: 30 — AB
Specific Gravity UA POCT: 1.014 (ref 1.001–1.035)
Urine pH: 8 (ref 5.0–8.0)
Urobilinogen, UA: 2 mg/dL

## 2011-07-04 LAB — RAPID DRUG SCREEN, URINE
Barbiturate Screen, UR: NOT DETECTED
Benzodiazepine Screen, UR: NOT DETECTED
Cannabinoid Screen, UR: NOT DETECTED
Cocaine, UR: NOT DETECTED
Opiate Screen, UR: NOT DETECTED
PCP Screen, UR: NOT DETECTED
Urine Amphetamine Screen: NOT DETECTED

## 2011-07-04 LAB — CBC
Hematocrit: 32.5 % — ABNORMAL LOW (ref 37.0–47.0)
Hgb: 11.1 g/dL — ABNORMAL LOW (ref 12.0–16.0)
MCH: 29 pg (ref 28.0–32.0)
MCHC: 34.2 g/dL (ref 32.0–36.0)
MCV: 84.9 fL (ref 80.0–100.0)
MPV: 9.5 fL (ref 9.4–12.3)
Nucleated RBC: 0 /100 WBC
Platelets: 389 10*3/uL (ref 140–400)
RBC: 3.83 10*6/uL — ABNORMAL LOW (ref 4.20–5.40)
RDW: 14 % (ref 12–15)
WBC: 7.23 10*3/uL (ref 3.50–10.80)

## 2011-07-04 LAB — FIBRINOGEN: Fibrinogen: 549 mg/dL — ABNORMAL HIGH (ref 189–458)

## 2011-07-04 LAB — PT AND APTT F/C
PT INR: 1 (ref 0.9–1.1)
PT: 13.1 s (ref 12.6–15.0)
PTT: 31 s (ref 23–37)

## 2011-07-04 LAB — LACTATE DEHYDROGENASE: LDH: 493 U/L (ref 307–575)

## 2011-07-04 LAB — TYPE AND SCREEN
AB Screen Gel: NEGATIVE
ABO Rh: O POS

## 2011-07-04 LAB — CREATININE, SERUM: Creatinine: 0.4 mg/dL — ABNORMAL LOW (ref 0.6–1.5)

## 2011-07-04 LAB — ALT: ALT: 16 U/L (ref 3–36)

## 2011-07-04 LAB — AST: AST (SGOT): 25 U/L (ref 10–41)

## 2011-07-04 LAB — GFR: EGFR: >60

## 2011-07-04 LAB — ALBUMIN: Albumin: 4.2 g/dL (ref 3.4–4.9)

## 2011-07-04 LAB — URIC ACID: Uric acid: 5.7 mg/dL (ref 2.3–6.1)

## 2011-07-05 LAB — COMPREHENSIVE METABOLIC PANEL
ALT: 19 U/L (ref 3–36)
AST (SGOT): 19 U/L (ref 10–41)
Albumin/Globulin Ratio: 1 — ABNORMAL LOW (ref 1.1–1.8)
Albumin: 3.5 g/dL (ref 3.4–4.9)
Alkaline Phosphatase: 153 U/L — ABNORMAL HIGH (ref 43–112)
BUN: 4 mg/dL — ABNORMAL LOW (ref 8–20)
Bilirubin, Total: 0.8 mg/dL (ref 0.1–1.0)
CO2: 22 mEq/L (ref 21–30)
Calcium: 8.9 mg/dL (ref 8.6–10.2)
Chloride: 100 mEq/L (ref 98–107)
Creatinine: 0.4 mg/dL — ABNORMAL LOW (ref 0.6–1.5)
Globulin: 3.4 g/dL (ref 2.0–3.7)
Glucose: 95 mg/dL (ref 70–100)
Potassium: 3.5 mEq/L — ABNORMAL LOW (ref 3.6–5.0)
Protein, Total: 6.9 g/dL (ref 6.0–8.0)
Sodium: 135 mEq/L — ABNORMAL LOW (ref 136–146)

## 2011-07-05 LAB — CBC
Hematocrit: 30.2 % — ABNORMAL LOW (ref 37.0–47.0)
Hgb: 10.2 g/dL — ABNORMAL LOW (ref 12.0–16.0)
MCH: 28.7 pg (ref 28.0–32.0)
MCHC: 33.8 g/dL (ref 32.0–36.0)
MCV: 84.8 fL (ref 80.0–100.0)
MPV: 9.4 fL (ref 9.4–12.3)
Nucleated RBC: 0 /100 WBC
Platelets: 344 10*3/uL (ref 140–400)
RBC: 3.56 10*6/uL — ABNORMAL LOW (ref 4.20–5.40)
RDW: 14 % (ref 12–15)
WBC: 7.65 10*3/uL (ref 3.50–10.80)

## 2011-07-05 LAB — AMYLASE: Amylase: 76 U/L (ref 0–90)

## 2011-07-05 LAB — GFR
EGFR: >60
EGFR: >60

## 2011-07-05 LAB — URINE CULTURE: Urine Culture, Routine: NO GROWTH

## 2011-07-05 LAB — HEPATIC FUNCTION PANEL
Bilirubin Direct: 0.4 mg/dL — ABNORMAL HIGH (ref 0.0–0.3)
Bilirubin Indirect: 0.3 mg/dL (ref 0.1–0.9)

## 2011-07-05 LAB — LACTATE DEHYDROGENASE: LDH: 455 U/L (ref 307–575)

## 2011-07-05 LAB — AST: AST (SGOT): 17 U/L (ref 10–41)

## 2011-07-05 LAB — CREATININE, SERUM: Creatinine: 0.4 mg/dL — ABNORMAL LOW (ref 0.6–1.5)

## 2011-07-05 LAB — ALT: ALT: 18 U/L (ref 3–36)

## 2011-07-05 LAB — LIPASE: Lipase: 54 U/L (ref 32–219)

## 2011-07-05 LAB — URIC ACID: Uric acid: 6.5 mg/dL — ABNORMAL HIGH (ref 2.3–6.1)

## 2011-07-06 ENCOUNTER — Encounter (INDEPENDENT_AMBULATORY_CARE_PROVIDER_SITE_OTHER): Payer: Self-pay | Admitting: Obstetrics

## 2011-07-06 LAB — CBC
Hematocrit: 28.8 % — ABNORMAL LOW (ref 37.0–47.0)
Hgb: 9.6 g/dL — ABNORMAL LOW (ref 12.0–16.0)
MCH: 28.4 pg (ref 28.0–32.0)
MCHC: 33.3 g/dL (ref 32.0–36.0)
MCV: 85.2 fL (ref 80.0–100.0)
MPV: 9 fL — ABNORMAL LOW (ref 9.4–12.3)
Nucleated RBC: 0 /100 WBC
Platelets: 312 10*3/uL (ref 140–400)
RBC: 3.38 10*6/uL — ABNORMAL LOW (ref 4.20–5.40)
RDW: 14 % (ref 12–15)
WBC: 9.74 10*3/uL (ref 3.50–10.80)

## 2011-07-06 LAB — CREATININE CLEARANCE
Body Surface Area:: 1.41
Creat Clearance Hours Collected: 24 Hr
Creat Clearance-Serum Creatinine: 0.4 mg/dL
Patient Height (In): 60 [in_us]
Urine Creat Clearance Calculation: 126 mL/min (ref 66–165)
Weight: 105 lb

## 2011-07-06 LAB — PT/INR
PT INR: 1.1 (ref 0.9–1.1)
PT: 13.8 s (ref 12.6–15.0)

## 2011-07-06 LAB — LACTATE DEHYDROGENASE: LDH: 322 U/L (ref 307–575)

## 2011-07-06 LAB — PROTEIN, URINE, 24 HOUR
Protein 24HR, UR: 148.5 mg/24 hr (ref 0.0–299.0)
Urine Volume:: 1100 mL

## 2011-07-06 LAB — AST: AST (SGOT): 15 U/L (ref 10–41)

## 2011-07-06 LAB — TRIGLYCERIDES: Triglycerides: 358 mg/dL — ABNORMAL HIGH (ref 34–149)

## 2011-07-06 LAB — URIC ACID: Uric acid: 5.5 mg/dL (ref 2.3–6.1)

## 2011-07-06 LAB — CREATININE, SERUM: Creatinine: 0.5 mg/dL — ABNORMAL LOW (ref 0.6–1.5)

## 2011-07-06 LAB — PREALBUMIN: Prealbumin: 16.1 mg/dL — ABNORMAL LOW (ref 19.9–41.9)

## 2011-07-06 LAB — ALT: ALT: 25 U/L (ref 3–36)

## 2011-07-06 LAB — GFR: EGFR: >60

## 2011-07-06 LAB — MAGNESIUM: Magnesium: 1.6 mg/dL (ref 1.6–2.3)

## 2011-07-06 LAB — PHOSPHORUS: Phosphorus: 3.7 mg/dL (ref 2.5–4.5)

## 2011-07-06 LAB — HEMOLYSIS INDEX: Hemolysis Index: 9 Index (ref 0–9)

## 2011-07-07 LAB — CBC
Hematocrit: 32.5 % — ABNORMAL LOW (ref 37.0–47.0)
Hematocrit: 29.8 % — ABNORMAL LOW (ref 37.0–47.0)
Hgb: 11 g/dL — ABNORMAL LOW (ref 12.0–16.0)
Hgb: 9.9 g/dL — ABNORMAL LOW (ref 12.0–16.0)
MCH: 28.9 pg (ref 28.0–32.0)
MCH: 28.1 pg (ref 28.0–32.0)
MCHC: 33.8 g/dL (ref 32.0–36.0)
MCHC: 33.2 g/dL (ref 32.0–36.0)
MCV: 85.3 fL (ref 80.0–100.0)
MCV: 84.7 fL (ref 80.0–100.0)
MPV: 9.6 fL (ref 9.4–12.3)
MPV: 9.1 fL — ABNORMAL LOW (ref 9.4–12.3)
Nucleated RBC: 0 /100 WBC
Nucleated RBC: 0 /100 WBC
Platelets: 333 10*3/uL (ref 140–400)
Platelets: 298 10*3/uL (ref 140–400)
RBC: 3.81 10*6/uL — ABNORMAL LOW (ref 4.20–5.40)
RBC: 3.52 10*6/uL — ABNORMAL LOW (ref 4.20–5.40)
RDW: 14 % (ref 12–15)
RDW: 14 % (ref 12–15)
WBC: 8.43 10*3/uL (ref 3.50–10.80)
WBC: 17.94 10*3/uL — ABNORMAL HIGH (ref 3.50–10.80)

## 2011-07-07 LAB — ALBUMIN: Albumin: 2.5 g/dL — ABNORMAL LOW (ref 3.4–4.9)

## 2011-07-07 LAB — GFR
EGFR: >60
EGFR: >60

## 2011-07-07 LAB — AST
AST (SGOT): 17 U/L (ref 10–41)
AST (SGOT): 19 U/L (ref 10–41)

## 2011-07-07 LAB — ALT
ALT: 23 U/L (ref 3–36)
ALT: 19 U/L (ref 3–36)

## 2011-07-07 LAB — CREATININE, SERUM
Creatinine: 0.4 mg/dL — ABNORMAL LOW (ref 0.6–1.5)
Creatinine: 0.4 mg/dL — ABNORMAL LOW (ref 0.6–1.5)

## 2011-07-07 LAB — LACTATE DEHYDROGENASE
LDH: 368 U/L (ref 307–575)
LDH: 438 U/L (ref 307–575)

## 2011-07-07 LAB — URIC ACID
Uric acid: 5.9 mg/dL (ref 2.3–6.1)
Uric acid: 5.7 mg/dL (ref 2.3–6.1)

## 2011-07-07 LAB — MAGNESIUM: Magnesium: 4.1 mg/dL — ABNORMAL HIGH (ref 1.6–2.3)

## 2011-07-08 LAB — CBC AND DIFFERENTIAL
Basophils Absolute Automated: 0.01 10*3/uL (ref 0.00–0.20)
Basophils Automated: 0 % (ref 0–2)
Eosinophils Absolute Automated: 0 10*3/uL (ref 0.00–0.70)
Eosinophils Automated: 0 % (ref 0–5)
Hematocrit: 27.6 % — ABNORMAL LOW (ref 37.0–47.0)
Hgb: 9.3 g/dL — ABNORMAL LOW (ref 12.0–16.0)
Immature Granulocytes Absolute: 0.03 10*3/uL
Immature Granulocytes: 0 % (ref 0–1)
Lymphocytes Absolute Automated: 1.25 10*3/uL (ref 0.50–4.40)
Lymphocytes Automated: 10 % — ABNORMAL LOW (ref 15–41)
MCH: 28.4 pg (ref 28.0–32.0)
MCHC: 33.7 g/dL (ref 32.0–36.0)
MCV: 84.4 fL (ref 80.0–100.0)
MPV: 9.2 fL — ABNORMAL LOW (ref 9.4–12.3)
Monocytes Absolute Automated: 0.63 10*3/uL (ref 0.00–1.20)
Monocytes: 5 % (ref 0–11)
Neutrophils Absolute: 10.52 10*3/uL — ABNORMAL HIGH (ref 1.80–8.10)
Neutrophils: 85 % — ABNORMAL HIGH (ref 52–75)
Nucleated RBC: 0 /100 WBC
Platelets: 279 10*3/uL (ref 140–400)
RBC: 3.27 10*6/uL — ABNORMAL LOW (ref 4.20–5.40)
RDW: 14 % (ref 12–15)
WBC: 12.44 10*3/uL — ABNORMAL HIGH (ref 3.50–10.80)

## 2011-07-08 LAB — AST: AST (SGOT): 22 U/L (ref 10–41)

## 2011-07-08 LAB — ALT: ALT: 24 U/L (ref 3–36)

## 2011-07-08 LAB — LAB USE ONLY - HISTORICAL SURGICAL PATHOLOGY

## 2011-07-08 LAB — CREATININE, SERUM: Creatinine: 0.4 mg/dL — ABNORMAL LOW (ref 0.6–1.5)

## 2011-07-08 LAB — MAGNESIUM
Magnesium: 4.9 mg/dL (ref 1.6–2.3)
Magnesium: 5.3 mg/dL (ref 1.6–2.3)

## 2011-07-08 LAB — URIC ACID: Uric acid: 5.4 mg/dL (ref 2.3–6.1)

## 2011-07-08 LAB — ALBUMIN: Albumin: 2.5 g/dL — ABNORMAL LOW (ref 3.4–4.9)

## 2011-07-08 LAB — LACTATE DEHYDROGENASE: LDH: 586 U/L — ABNORMAL HIGH (ref 307–575)

## 2011-07-08 LAB — GFR: EGFR: >60

## 2011-07-13 ENCOUNTER — Ambulatory Visit (INDEPENDENT_AMBULATORY_CARE_PROVIDER_SITE_OTHER): Payer: Self-pay

## 2011-07-13 VITALS — BP 141/89 | HR 87 | Temp 97.2°F

## 2011-07-13 DIAGNOSIS — Z9889 Other specified postprocedural states: Secondary | ICD-10-CM

## 2011-07-13 NOTE — Progress Notes (Signed)
Pt states she is taking 2R and 6 NPH insulin with breakfast and 2R and 2 NPH with dinner, but she does not have her glucose log with her. Pt states that she was sent home from the hospital on medication for blood pressure, but cannot recall the name of it. Pt denies headaches, visual changes, or epigastric pain.  Pt comes in today s/p repeat LTCS on   07/07/11. Incision is clean, dry, and intact.  Pt understands care of incision and how to contact MD if necessary.  D/W Dr. Nonnie Done. Pt is to RTC in 2 days with her medication and her glucose log.   Bess Kinds

## 2011-07-15 ENCOUNTER — Ambulatory Visit (INDEPENDENT_AMBULATORY_CARE_PROVIDER_SITE_OTHER): Payer: Self-pay

## 2011-07-23 ENCOUNTER — Ambulatory Visit (INDEPENDENT_AMBULATORY_CARE_PROVIDER_SITE_OTHER): Payer: Self-pay

## 2011-07-29 NOTE — Op Note (Unsigned)
DATE OF BIRTH:                        05/25/1981      ADMISSION DATE:                     06/02/2006            PATIENT LOCATION:                    [id number]            DATE OF PROCEDURE:                   06/03/2006      SURGEON:                            Kristopher Glee, MD      ASSISTANT(S):                         Noah Delaine, MD                                          Hurshel Keys, MD            PREOPERATIVE DIAGNOSES:      1.   TERM INTRAUTERINE PREGNANCY.      2.   ARREST OF DESCENT SECONDARY TO DEEP TRANSVERSE ARREST AND POSSIBLE      CEPHALOPELVIC DISPROPORTION.      3.   CHORIOAMNIONITIS.            POSTOPERATIVE DIAGNOSES:      1.   TERM INTRAUTERINE PREGNANCY.      2.   ARREST OF DESCENT SECONDARY TO DEEP TRANSVERSE ARREST AND POSSIBLE      CEPHALOPELVIC DISPROPORTION.      3.   CHORIOAMNIONITIS.            PROCEDURE:  PRIMARY LOW TRANSVERSE CESAREAN SECTION.            ANESTHESIA:  Epidural.            ESTIMATED BLOOD LOSS:   750 mL.            FLUIDS:  1200 mL crystalloid.            URINE OUTPUT:  100 mL initially blood-stained, later clearing.            COMPLICATIONS:  None.            DISPOSITION:  Stable to PACU.            FINDINGS:  Viable female infant in right occipitotransverse position, no      nuchal cord, clear fluid.  Well-formed low segment, bilateral normal tubes      and ovaries.  Placenta and cord complete and 3-vessel cord noted.            DESCRIPTION OF PROCEDURE:  After informed consent was obtained, the patient      was taken to the operating room, where her epidural anesthesia was topped      off.  She was then cleaned and draped in the normal sterile fashion in      supine position and a Foley catheter had been placed prior to transfer to  the OR.  After anesthesia was deemed adequate, a Pfannenstiel skin incision      was made 2 fingerbreadths above the symphysis pubis, carried down to the      underlying fascia with the Bovie.  The fascia was then incised centrally       and then on either side using the Mayo scissors.  The rectus muscles were      dissected off both inferiorly and superiorly from the overlying fascia.            Peritoneum was entered digitally.  Adequate exposure was obtained, and the      uterovesical peritoneum identified.  The bladder flap created and the      bladder blade inserted.  The lower uterine segment was then incised down to      the underlying membranes.  The uterine incision was extended bilaterally      digitally, and the baby delivered from vertex presentation right      occipitotransverse position.  The cord was clamped and cut and baby handed      over to the awaiting pediatrician.  Cord bloods were sent.            The placenta was then removed spontaneously; 3-vessel cord noted.  The      cavity was cleared of clots and debris.  The uterine incision was examined;      no extensions were noted.  It was sutured in 2 layers, the first using 0      Monocryl in a running fashion in a vertical interlocked suture and the      second a horizontal imbricating, also with a 0 Monocryl.  After the uterus      was sutured, hemostasis was noted, the gutters cleaned of clots and debris,      the tubes and ovaries identified bilaterally normal.  The peritoneal cavity      was irrigated with warm normal saline.            The muscles and the peritoneum were apposed using interrupted Monocryl.      The subcutaneous fat was irrigated with warm normal saline.  The skin was      closed with staples.  The laparotomy, instrument, and needle counts were      correct x2.  The patient had been on ampicillin and gentamicin in labor      secondary to fever.  This is to be continued postoperatively for 24 hours      afebrile.  The patient also had chronic hypertension and will have followup      HELLP labs in the a.m.                                                ___________________________________          Date Signed: __________      Kristopher Glee, MD  ([id number])             D: 06/03/2006 by Hurshel Keys, MD      T: 06/05/2006 by [id number] ([id number]) ([id number])      cc:  Kristopher Glee, MD

## 2011-08-11 NOTE — H&P (Signed)
Kelsey Miles, Kelsey Miles      MRN:          [id number]      Account:      [id number]      Document ID:  [id number]                  Admit Date: 02/09/2011            Patient Location: [id number]      Patient Type: I            ATTENDING PHYSICIAN: Hyacinth Meeker, MD                  HISTORY OF PRESENT ILLNESS:      A70 year old G77, P2-0-1-2 at 12 weeks and 2 days by last menstrual period      of November 15, 2010, consistent with a 12-week sonogram, per the patient,      with estimated due date of August 22, 2011, admitted for uncontrolled      class B diabetes from the clinic.  The patient denies vaginal bleeding,      leakage of fluid, and contractions.  She denies fetal movement yet.  She      denies dysuria.  She does report some reflux symptoms, as well as round      ligament discomfort.  Her fingerstick log from home reveals fasting sugars      ranging from 113 to 131 and postprandial values ranging from 70 to 192.      The patient is aware that she is being admitted for glucose control and      initiation of insulin.            Her pregnancy is complicated by:      1.  Class B diabetes.      2.  History of cesarean section x1 for arrest of descent and      chorioamnionitis in 2007.      3.  Klebsiella UTI status post treatment on November 28, 2010, and again on      January 22, 2011.            OBSTETRICAL HISTORY:      In 2004, she had a spontaneous abortion at 8 weeks.  In 2005, she had a      full-term female weighing 7 pounds by vaginal delivery at [facility] that was uncomplicated.  In 2007, she had a full-term female      weighing 9 pounds by primary low transverse cesarean section at [facility] for arrest of descent and chorioamnionitis.            GYNECOLOGIC HISTORY:      Menarche age 5, with regular menses lasting 3 days.  No history of abnormal      Pap smears.  No history of sexually transmitted infections.            PAST MEDICAL HISTORY:      Class B diabetes.             PAST SURGICAL HISTORY:      Primary low transverse cesarean section x1 in 2007.            MEDICATIONS:  Page 1 of 3      Kelsey Miles, Kelsey Miles      MRN:          [id number]      Account:      [id number]      Document ID:  [id number]                  Include Glipizide 5 mg b.i.d., prenatal vitamin.            ALLERGIES:      No known drug allergies.            SOCIAL HISTORY:      Negative x3.            PHYSICAL EXAMINATION:      VITAL SIGNS:  Weight 107.3 pounds.      VITAL SIGNS:  Include temperature 97.8, pulse 90, respiratory rate 18, and      blood pressure 125/82.  Her random fingerstick is 124.      GENERAL APPEARANCE:  No acute distress.      CARDIOVASCULAR:  Regular rate and rhythm.      PULMONARY:  Clear to auscultation bilaterally.      ABDOMEN:  Soft, gravid, nontender.      EXTREMITIES:  No edema.      PELVIC:  Deferred.            PRENATAL LABORATORY DATA:      Include blood type O positive, rubella immune, hepatitis B negative, RPR      negative.  A 24-hour urine collection was done on January 25, 2011, revealing      131.3 mg of protein, a volume of 1750 mL with creatinine clearance of 196.            ASSESSMENT AND PLAN:      A 30 year old G4, P2-0-1-2 at 12 weeks and 2 days by last menstrual period,      consistent with a week sonogram, per the patient, admitted for uncontrolled      class B diabetes.  The patient will be admitted to [facility] with activity ad      lib.  A nutrition consult will be obtained.  We will check fasting and      2-hour postprandial fingersticks.  Baseline PIH labs have already been done as      well as a baseline 24-hour urine protein.  However, upon admission, we will      check a complete blood count and a comprehensive metabolic panel.  We will also      check a baseline 12-lead electrocardiogram, a urinalysis and urine culture. The      fetus will need a fetal echo around 20-22 weeks.  For medications, we will      start her on insulin  based on her weight of 107 pounds at 0.7 units per      kilogram total. Therefore, the patient will receive NPH 7 units q.a.m. and 3      units nightly. She will receive Novolog insulin 3 units with each meal to      start. We will also order prenatal vitamin, iron, Colace, folic acid, Maalox,      Tylenol, Tums, Pepcid, Ambien. Fetal heart tones will be checked daily.      Discussed with Dr. Estill Batten.                        Electronic Signing Provider  D:  02/09/2011 16:56 PM by Dr. Arvella Merles, MD ([id number])                                   Page 2 of 3      Kelsey Miles, Kelsey Miles      MRN:          [id number]      Account:      [id number]      Document ID:  [id number]                  T:  02/09/2011 17:22 PM by [id number]                  cc:                                   Page 3 of 3      Authenticated and Edited by Arvella Merles, MD ([id number]) On 02/10/11 7:24:11 AM      Authenticated by Manuela Schwartz, MD [phone]) On 02/16/2011 05:27:48 PM

## 2011-08-11 NOTE — H&P (Signed)
Kelsey Miles, Kelsey Miles      MRN:          [id number]      Account:      [id number]      Document ID:  [id number]                  Admit Date: 06/26/2011            Patient Location: DISCHARGED 06/26/2011      Patient Type: V            ATTENDING PHYSICIAN: Huda Al-Kouatly, MD            HISTORY OF PRESENT ILLNESS:      This is a 30 year old gravida 4, para 2-0-1-2 at 31 weeks and 6 days dated      by last menstrual period, consistent with a 6-week sonogram for a due date      of August 22, 2011, who comes in today complaining of lower abdominal      pain, epigastric pain, as well as back pain which is bilateral and mid to      low back, which has worsened since yesterday.  She does not have any loss      of fluid, vaginal bleeding, has good fetal movement.  She had been taking      her Neurontin as prescribed for history of fibromyalgia, but she says this      was previously working really well for her, but now is not working as well.      She denies any dysuria.  She is still having a lot of spitting, but denies any      real significant nausea or vomiting.  No fevers, chills.  Her pregnancy has      been complicated by frequent admissions due to nausea and vomiting often      correlated with elevated blood pressures.  She has been ruled out for      preeclampsia multiple times.  She also has a history of prior cesarean      section.  She has had recurrent urinary tract infections and pyelonephritis      in this admission, although despite recommendations she does not take her      suppression when she is at home.  She also is a class B diabetic who is on      insulin.            PAST OBSTETRIC HISTORY:      Gravida 1 in 2004, spontaneous abortion at 8 weeks.  Gravida 2 in 2005,      full-term, 7 pounds, vaginal delivery, pregnancy complicated by elevated      pressures.  Gravida 3 in 2007, full-term, 9 pounds, primary cesarean      section for failure to progress.  Gravida 4 is her current pregnancy.             PAST GYNECOLOGIC HISTORY:      Regular menses.  No sexually transmitted infections or abnormal Pap smears.            PAST MEDICAL HISTORY:      Class B diabetes diagnosed about 4 or 5 years ago.            PAST SURGICAL HISTORY:      Cesarean x1.            MEDICATIONS:      She takes regular insulin 2 R units and NPH 6 units  every morning.  She takes      2 units of R with her meals and 2 units of N at bedtime.  Per her records,                                   Page 1 of 3      Kelsey Miles, Kelsey Miles      MRN:          [id number]      Account:      [id number]      Document ID:  [id number]                  she should be on Keflex for suppression for history of pyelonephritis in      May 2012.  She is also on prenatal vitamins, iron, and Phenergan as needed.            ALLERGIES:      No known drug allergies.            FAMILY HISTORY:      Diabetes.            SOCIAL HISTORY:      She denies smoking, drinking, or drug use.  She is married.  She is      unemployed.            PHYSICAL EXAMINATION:      VITAL SIGNS:  Blood pressure is 151/81, pulse of 82.  External fetal      monitoring reactive for gestational age, no contractions.      GENERAL:  She is in no acute distress.      ABDOMEN:  Soft, no rebound, no guarding.  Mild suprapubic pain and      epigastric pain.  Low back is diffusely and minimally tender, although      there is no CVA tenderness.      PELVIC:  On vaginal exam, there are normal external female genitalia,      normal vaginal mucosa and cervix.  Her cervix is long, 1, high.            LABORATORY DATA:      CBC:  White count 8.57, H and H of 10.5/31.6, platelets of 413.  ALT 15,      AST 15.  Urinalysis is turbid, protein 1+, positive nitrites, large      leukocyte esterase.  Urine culture is pending.  Last 24-hour urine was done      on September 5 at 452 mg.  This is increased from baseline on a previous      24-hour urine on August 8 when it was 142.5.            ASSESSMENT AND PLAN:       This is a 30 year old gravida 4, para 2-0-1-2, at 41 and 6, here with      generalized pain and abdominal discomfort with a urinary tract infection,      but no evidence of worsening preeclampsia.      1.  As for her preeclampsia, the patient has previously been evaluated for      preeclampsia because she has had borderline elevated blood pressures which      have not required medication and all of her 24-hour urines have previously      been normal.  Per the computer, there is a 24-hour urine from September  5,      which puts her in the mild range at 452 mg.  Today, she is without any      symptoms of preeclampsia.  Her PIH labs are within normal limits and her      indigestion, which has been an ongoing issue in this pregnancy, is now      completely resolved with 1 dose of Reglan.      2.  She has a history of fibromyalgia for which she takes Neurontin 300      t.i.d.  I encouraged her to continue taking this.  She has been given a      dose of Toradol in triage and now feels much better.                                   Page 2 of 3      Kelsey Miles, Kelsey Miles      MRN:          [id number]      Account:      [id number]      Document ID:  [id number]                  3.  She has a history of recurrent urinary tract infection and      pyelonephritis.  She is supposed to be on Keflex daily at home.  It does      not appear that she is actually taking this.  I reinforced the importance      of taking her suppression.  We have also looked back through her records      and her last urine culture shows that the bug was sensitive to Macrobid, so      she has been given a prescription for Macrobid and instructed to take the      entire course in order to clear up this urinary tract infection and then      resume taking her Keflex.  There is no evidence of pyelonephritis.      4.  This patient has had many issues with noncompliance and multiple      hospitalizations.  I again reinforced the importance of continuing her  care      and following up in the clinic, especially now given the fact that she has      a diagnosis of mild preeclampsia.  She needs to be followed very closely.      She ensures me that next week she he does have an appointment which she      does intend to keep.  This was discussed with Dr. Rory Percy.                        Electronic Signing Provider            D:  06/26/2011 13:53 PM by Dr. Efraim Kaufmann B. Lorrene Reid, MD ([id number])      T:  06/26/2011 16:45 PM by [id number]                  cc:                                   Page 3 of 3      Authenticated and Edited by Merril Abbe. Lorrene Reid, MD ([id number])  On 07/02/11 1:04:48 PM      Authenticated and Edited by Denny Peon. Al-Kouatly, MD ([id number]) On 07/28/11 10:59:00 AM

## 2011-08-11 NOTE — H&P (Signed)
Kelsey Miles, Kelsey Miles      MRN:          [id number]      Account:      [id number]      Document ID:  [id number]                  Admit Date: 07/04/2011            Patient Location: [id number]      Patient Type: I            ATTENDING PHYSICIAN: Orland Dec, MD                  HISTORY OF PRESENT ILLNESS:      This is a 30 year old G5-P2-0-1-2 at 68 weeks and 0 days by LMP of November 15, 2010, consistent with a 6-week ultrasound with EDD of August 22, 2011.       This patient presented to triage with nausea and vomiting for the past 2      days, headache, epigastric pain, and back pain.  The patient reports these      symptoms have been persistent over the past 2 days.  Of note, the patient      had a recent hospitalization on the HRP service on June 26, 2011.  The      patient was seen at that time for abdominal pain, urinary tract infection,      and worsening preeclampsia.  At that time, September 5, the patient has a      history of a 24-hour urine on September 5 of 452 mg.  The patient currently      reports no vaginal bleeding, no leakage of fluid, no contractions and good      fetal movement.  Pregnancy is complicated by mild preeclampsia as reported      previously.  The patient has been hypertensive with a 24-hour urine of      greater than 400 mg, pyelonephritis.  The patient hospitalized May 27 to      June 1 for E. coli UTI and is on Keflex prophylaxis, class B diabetes.            PAST OBSTETRICAL HISTORY:      In 2004, spontaneous SAB at 8 weeks.  In 2005 NSVD at 40 weeks, a 7-pound      infant.  In 2007, primary low transverse cesarean section at 40 weeks for a      9-pound infant for failure to progress.            PAST GYNECOLOGICAL HISTORY:      Regular menses, no sexually transmitted infections or abnormal Pap smears.            PAST MEDICAL HISTORY:      Class B diabetes diagnosed about 4 to 5 years ago.            PAST SURGICAL HISTORY:      Cesarean x1.            MEDICATIONS:       Insulin regular 2 units subcutaneously every morning and 2 units      subcutaneously every night at dinner, insulin NPH 6 units subcutaneously      every morning and at 2 units subcutaneously every day with dinner, 2 units      subcutaneously every night at bedtime, Keflex 500 mg daily for UTI      suppression,  prenatal vitamin once daily and iron.                                         Page 1 of 3      [name], [name]      MRN:          [id number]      Account:      [id number]      Document ID:  [id number]                  ALLERGIES:      No known drug allergies.            FAMILY HISTORY:      Diabetes.            SOCIAL HISTORY:      The patient denies smoking, drinking, and drug use.  She is married and      currently unemployed.            PHYSICAL EXAMINATION:      VITAL SIGNS:  Blood pressure 168/111, repeat 161/122, pulse 80,      respirations 18, temperature 98.3.      GENERAL:  The patient is in moderate distress, actively vomiting.      LUNGS:  Regular rate and rhythm.      HEART:  Clear to auscultation bilaterally.      ABDOMEN:  Mild epigastric and suprapubic tenderness.      EXTREMITIES:  No edema.      FETAL HEART TRACING:  155, moderate, no accelerations, occasional variable      decelerations.      TOCOMETRY:  Irritable.      PELVIC:  Cervix long, 1 and high.            LABORATORY DATA:      White blood cell count 7.23, hemoglobin 11.1, hematocrit 32.5, platelets      389.  Creatinine 0.4, uric acid 5.7.  ALT 16, AST 25, LDH 493.  Fibrinogen      545.  Urine:  Cloudy, protein 1+, trace ketones, moderate leukocytes, 25      white blood cells per high-power field.  O positive, antibody negative.      June 16, 2011, 24-hour urine 452 mg/hour, increased from August 7,      where her 24-hour urine was 142.5 mg.            ASSESSMENT AND PLAN:      This is a 30 year old G5-P2-0-1-2 at 33 weeks and 0 days by last menstrual      period, here with severe-range blood pressures, nausea, vomiting,  headache      concerning for severe preeclampsia.      1.  Elevated blood pressures, signs and symptoms of preeclampsia.  [facility]      labs ordered hydralazine 10 mg IV x1 to stabilize blood pressures.      Previous 24-hour urine shows at least 450 mg of protein.  The patient will      need a repeat 24-hour urine.      2.  Consider betamethasone as patient has recurrent admissions and      worsening preeclampsia.  Would encourage 2 doses during this admission.      3.  Recurrent urinary tract infections and history of pyelonephritis.  The      patient on  Keflex daily for UTI suppression, reports taking this      medication.  We will continue this during this hospitalization.      4.  Interval events:  The patient received 10 mg of hydralazine, pressures                                   Page 2 of 3      CARNETTA, LOSADA      MRN:          [id number]      Account:      [id number]      Document ID:  [id number]                  are currently 130s to 160s/90s to low 100s.  The patient was started on      Procardia 30 mg XL for improvement in blood pressures.  Symptoms are      stable.  The patient was sent for BPP as fetal heart tracing was not      reactive.  BPP was 8/8.  The infant was EFW 27th percentile, SD ratio 2.3.      Placenta is posterior.  Cervix is 3.1 cm.  We will admit to HRP for further      observation, blood pressure control.  Consider betamethasone during this      hospitalization and magnesium for severe preeclampsia and/oreuroprotection      nprotection. Discussed plan with Dr. Brunetta Genera, Dr. Jennefer Bravo, Dr. Allene Dillon who agree      with management.            R2:  jg 07/05/11                        Electronic Signing Provider            D:  07/04/2011 20:11 PM by Dr. Toni Amend D. Maxcine Ham, MD ([id number])      T:  07/05/2011 08:45 AM by [id number]                  cc:                                   Page 3 of 3      Authenticated and Edited by Jaci Carrel. Maxcine Ham, MD ([id number]) On 07/09/11 1:59:36 PM       Authenticated by Jaci Carrel. Maxcine Ham, MD ([id number]) On 07/25/2011 02:47:01 PM

## 2011-08-11 NOTE — Discharge Summary (Signed)
Kelsey Miles, Kelsey Miles      MRN:          [id number]      Account:      [id number]      Document ID:  [id number]                  Admit Date: 02/09/2011      Discharge Date: 02/14/2011            ATTENDING PHYSICIAN:  Sharia Reeve, MD                  HISTORY OF PRESENT ILLNESS:      This patient was admitted on Feb 09, 2011, as a 30 year old G4, P2-0-1-2 at      12 weeks and 2 days by LMP consistent with a 12 week sonogram with      estimated due date of August 22, 2011.  She was admitted for uncontrolled      class B diabetes.  She did not have any vaginal bleeding, leakage of fluid,      or contractions, and she did not feel fetal movement yet.  She did not have      any dysuria.  Her pregnancy is complicated by class B diabetes, history of      1 cesarean section, klebsiella UTI during this pregnancy.            HOSPITAL COURSE:      On admission, the patient was admitted to the HRP unit with activity ad      lib.  She was started on insulin initially with NPH 7 units q.a.m., 3 units      nightly and regular insulin 4 units q.a.m. and 3 units with dinner.      Fasting and 2-hour postprandial fingersticks were checked daily during her      hospitalization.  It was noted that baseline pH labs and 24-hour urine      collection had already been done on April 16 and April 20 and therefore      were not repeated.  The plan was to monitor her fingersticks closely and      adjust her insulin as needed.  Her insulin was adjusted on hospital day #1      and again, on hospital day 3, which was Feb 12, 2011.  At that time, her NPH      was increased to 16 units q.a.m. and 8 units nightly.  She was kept on 7      units of regular insulin with breakfast and 6 units with dinner.  On Feb 13, 2011 the patient did report some emesis the day prior with some epigastric      discomfort.  Therefore, the patient was started on Zofran as needed, and      Pepcid twice a day.  On Feb 14, 2011, the patient reported feeling much       better and did not require an antiemetic overnight.  She was having bowel      movements after being given MiraLax and Dulcolax as well given her history      of no bowel movement for 5 days.  Because her sugars were well controlled      at day of discharge, the patient was discharged home.  The insulin regimen      upon discharge included 16 units of NPH q.a.m. and 8 units nightly and 7  units of regular with breakfast and 6 units of regular with dinner.  The      patient was discharged home with this regimen and the plan was to follow up      in the clinic in 1 week.  The patient was also given prescriptions for      insulin and was also given a prescription for Macrobid to complete a 7-day      course for UTI.  Other prescriptions included Pepcid for heartburn and      Phenergan as needed for nausea as well.  Strict precautions were given to      the patient for hypoglycemia as well.                                                     Page 1 of 2      Kelsey Miles, Kelsey Miles      MRN:          [id number]      Account:      [id number]      Document ID:  [id number]                  Electronic Signing Provider            D:  02/17/2011 10:49 AM by Dr. Arvella Merles, MD ([id number])      T:  02/17/2011 19:16 PM by [id number]                        cc:                                   Page 2 of 2      Authenticated by Arvella Merles, MD ([id number]) On 02/18/2011 06:34:48 AM      Authenticated by Sharia Reeve, MD ([id number]) On 02/25/2011 08:37:28 AM

## 2011-08-11 NOTE — Op Note (Signed)
Kelsey Miles, Kelsey Miles      MRN:          [id number]      Account:      [id number]      Document ID:  [id number]      Procedure Date: 07/07/2011            Admit Date: 07/04/2011            Patient Location: [id number]      Patient Type: I            SURGEON: Ocie Doyne MD      ASSISTANT:  Margit Banda MD                  SECOND ASSISTANT:      Abi-Khalil PGY-2.            PREOPERATIVE DIAGNOSES:      Repeat cesarean section, multiparous, desiring permanent sterilization, 33      and 3 weeks, severe preeclampsia, intrauterine growth restriction.            POSTOPERATIVE DIAGNOSES:      Repeat cesarean section, multiparous, desiring permanent sterilization, 33      and 3 weeks, severe preeclampsia, intrauterine growth restriction.            TITLE OF PROCEDURE:      Repeat cesarean section and bilateral tubal ligation.            ESTIMATED BLOOD LOSS:      500 mL.            INTRAVENOUS FLUIDS:      2500 mL.            URINE OUTPUT:      300 mL.            PATHOLOGY:      Placenta and portions of right and left fallopian tubes.            FINDINGS:      Viable female infant in vertex presentation a thinned out lower uterine      segment.  Nuchal cord x2, Apgars 7 and 9, weight 4 pounds 3 ounces.  Thin      meconium.            DESCRIPTION OF PROCEDURE:      The patient was taken to the operating room after informed consent was      obtained.  She was prepped and draped in the normal sterile fashion and      placed in dorsal supine position with a leftward tilt.  After adequate      anesthesia was obtained, she was prepped and draped in the normal sterile                                   Page 1 of 3      Kelsey Miles, Kelsey Miles      MRN:          [id number]      Account:      [id number]      Document ID:  [id number]      Procedure Date: 07/07/2011            fashion.  A skin incision was made with a scalpel and carried through the      underlying layer of the fascia with the Bovie.  The fascia  was incised in       the midline and carried out laterally using the Mayo scissors.  The fascia      was grasped inferiorly with the Kocher, tented up, and dissected off the      underlying rectus muscles sharply.  The fascia was then tented up with      Kochers superiorly and dissected off the underlying rectus muscles sharply.       The rectus muscles were grasped with Allises and the rectus muscles were      divided sharply using a knife.  The peritoneum was entered sharply.  The      rectus muscles were opened up and down using the Mayo scissors.  The      bladder blade was inserted.  The uterus was noted to be free of any      adhesions.  The lower uterine segment was palpated to be very thin and was      entered using a Kelly clamp.  Meconium was noted.  Pediatrics was made      aware.  The infant's head was then delivered atraumatically.  There was      nuchal cord x2.  The infant was delivered without difficulty.  The cord was      clamped x2 and cut, and the baby was handed off to the waiting      pediatricians.  Cord segment was collected.  Cord blood was collected.      Placenta delivered spontaneously.  The uterus was removed from the      peritoneal cavity and exteriorized and was wrapped with 3 moist gauze.  The      uterine cavity was cleared of all clots and debris.  The hysterotomy was      closed using Monocryl in 1 layer.  The hysterotomy was noted to be bleeding      slightly in the midline and a figure-of-eight suture was used over the      midline to create hemostasis.            Attention was then turned to the fallopian tubes.  The Tanja Port was used to      grasp the right fallopian tube.  A 3-cm segment distal to the fimbria.  The      tube was walked out to the fimbria.  The Tanja Port was lifted up, and 2      sutures of plain gut were tied underneath the Babcock to create a knuckle      of tube.  A 3-cm knuckle was then tented up and the Metzenbaum scissors      were used to create a hole in the mesosalpinx.   The Metzenbaum scissors      were then used to remove fat knuckle portion of the tube.  The right      fallopian tube was thus removed.  The left fallopian tube was also ligated      in a similar fashion using the Pomeroy technique, and a 3-cm segment of the      left fallopian tube was excised.  The tube segments were noted to be      hemostatic.  The ends were briefly cauterized for hemostasis.  The uterus      was then returned to the abdomen.  The abdomen was irrigated and the clots      and debris were removed, and the hysterotomy was again examined and noted  to be hemostatic.  The tubal areas were again noted to be hemostatic.  The      rectus was noted to be hemostatic.  The bladder blade was removed.  The      fascia was then closed using 2-0 PDS sutures meeting in the midline.  The      skin was then closed using a 3-0 Monocryl.  The patient tolerated the      procedure well.  Sponge, lap and needle counts were correct x3.  The      patient went to the recovery room in stable condition.  This patient will      be continued on magnesium postpartum.                                               Page 2 of 3      Kelsey Miles, Kelsey Miles      MRN:          [id number]      Account:      [id number]      Document ID:  [id number]      Procedure Date: 07/07/2011                  Electronic Signing Provider            D:  07/07/2011 12:29 PM by Dr. Jon Gills C. Irven Shelling, MD ([id number])      T:  07/07/2011 19:20 PM by [id number]                  cc:                                   Page 3 of 3      Authenticated and Edited by Elizebeth Brooking. Irven Shelling, MD ([id number]) On 07/09/11 11:46:57 AM      Authenticated by Loistine Chance, MD [phone]) On 07/14/2011 11:22:13 AM

## 2011-08-11 NOTE — Consults (Signed)
Kelsey Miles, Kelsey Miles      MRN:          [id number]      Account:      [id number]      Document ID:  [id number]      Service Date: 02/28/2011            Admit Date: 02/26/2011            Patient Location: [id number]      Patient Type: I            CONSULTING PHYSICIAN: Morad Tal Lovie Macadamia MD            REFERRING PHYSICIAN:                  CONSULTING SERVICE:      Psychiatry.            HISTORY OF PRESENT OF PRESENT:      This is a 30 year old married female of [place] origin who was admitted to      the hospital, evaluated in the outpatient clinic and recommended for      inpatient treatment for her condition.  At the time of admission, the      patient was reportedly nauseous and vomiting, unable to keep food down,      severe uncomfortableness and subsequently recommended for inpatient      treatment in OB/GYN service.  At the time of admission, the patient      reported that she does not feel good, has suicidal ideations, unable to      contract for safety, and subsequently a sitter was ordered to keep an eye      on her on a 24-hour basis.  A psychiatric consult was initiated as patient      is depressed and evaluation and need for a sitter.            PAST MEDICAL HISTORY:      No acute medical problems were noted other than diabetes mellitus was      suspected to be gestational.  Patient has had this for the past 3 to 4      years and also was noted to have a UTI.            CURRENT MEDICATIONS:      None known.            PAST PSYCHIATRIC HISTORY:      Unknown.            ALLERGIES:      None known.            REVIEW OF SYSTEMS:      The patient is uncomfortable, poor sleep, irritability at times.            SOCIAL HISTORY:      The patient was married for about 8 or 9 years, came to this country in      year 2000, patient is originally from [place].  Husband works in                                   Page 1 of 2      Kelsey Miles, Kelsey Miles      MRN:          [id number]      Account:      [id number]      Document ID:  [id number]      Service Date: 02/28/2011            Holiday representative.  The patient denies alcohol, substance, or cigarette smoking.       The patient lives with her husband and 2 other children, 3 and 29 years      old.            MENTAL STATUS EXAMINATION:      Well-developed Hispanic female, [redacted] weeks pregnant, alert, oriented to      person, fair eye contact.  Denies hearing voices, denies suicidal or      homicidal ideations, contract for safety.  The patient has a better out      took about her current pregnancy.  The patient is able to contract for      safety.  Her insight and judgment appeared to be fair.            DIAGNOSTIC FORMULATION:      This is a 30 year old Hispanic female who is [redacted] weeks pregnant, presented      to the hospital for severe nausea, vomiting, unable to eat.  At the time of      admission, the patient was reported to have suicidal ideations, does not      feel like living.            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      AXIS II:  Deferred, 799.90.      AXIS III:  First trimester pregnancy.      AXIS IV:  Moderate.      AXIS V:   40 to 0.            PLAN AND RECOMMENDATIONS:      The patient is not suicidal, not homicidal.  Education administrator.  The      patient has better moods today, able to contract for safety.                        Electronic Signing Provider            D:  02/28/2011 19:12 PM by Dr. Laury Axon. Elpidio Anis, MD ([id number])      T:  03/01/2011 10:59 AM by [id number]                  cc:                                   Page 2 of 2      Authenticated by Laury Axon. Elpidio Anis, MD ([id number]) On 03/05/2011 05:02:08 PM

## 2011-08-11 NOTE — H&P (Signed)
Kelsey Miles, Kelsey Miles      MRN:          [id number]      Account:      [id number]      Document ID:  [id number]                  Admit Date: 02/26/2011            Patient Location: [id number]      Patient Type: I            ATTENDING PHYSICIAN: [facility], MD                  HISTORY OF PRESENT ILLNESS:      The patient is a 30 year old G4, P2-0-1-2 at 29 and 5 weeks by LMP      consistent with a 12-week sonogram with an estimated due date of August 22, 2011, who was admitted from the clinic directly to the HRP unit after      presenting to the clinic and noting persistent nausea and vomiting with the      weight loss from 108 pounds to 101 pounds within a week's time.  She also      feels abdominal and back pain, particularly back pain, poor p.o. intake but      no fevers or chills reported in clinic.  The abdominal pain, the patient      complained of, was epigastric in nature.  The patient had presented to the      emergency room on the night minutes prior, where she was evaluated in the      emergency room with an ultrasound showing no hydronephrosis and she had an      abdominal ultrasound for the pregnancy which was unremarkable.            The patient upon presenting to the HRP unit expressed to the      nurses intent of causing harm to herself.  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      Particularly she feels that her children are going to bed hungry at night      because she cannot help support and feed them.  She reports that her husband      works during the day and has only home late at night and that she says yes has      had passing thoughts of hurting herself out of  desperation because she feels so      ill.  She does not, in fact, have a plan.  She does not, in fact, want to die;      and she does not want to cause harm to others, and she has not attempted any      means of ending her life.            LABORATORY AND DIAGNOSTIC DATA:      Her labs prior are only remarkable for a white count of 16, H and H of 12  and 36, platelets of 372.  Her BMP was within normal limits.  Her urine was      a dirty specimen, but it showed 2+ ketones, moderate leucocytes, 26 to 50      WBCs; and a final urine culture showed 100,000 CFU of E.coli sensitive to      Macrobid.            PRENATAL LABORATORY DATA:      She is O positive.  Her RPR was nonreactive.  She is rubella immune.  She      is hepatitis B negative, HIV negative.  Her pregnancy is complicated by      class B diabetes.  She has had 1 prior admission for glucose control for      which she was started on an insulin regimen.  She has a history of                                   Page 1 of 3      Kelsey Miles, Kelsey Miles      MRN:          [id number]      Account:      [id number]      Document ID:  [id number]                  C-section for arrest of descent and chorioamnionitis.  She had a Klebsiella      UTI twice in this pregnancy and E.coli pregnancy as documented above.            OBSTETRICAL HISTORY:      In 2004, she had an SAB.  In 2005, she had a term vaginal delivery here at      [facility] to a female weighing 7 pounds.  In 2007, she had a      term Cesarean delivery to a female weighing 9 pounds for arrest of descent      and chorioamnionitis.            PAST GYNECOLOGICAL HISTORY:      Menarche at 50, regular menstrual cycles lasting 3 days.  No abnormal Pap      smears or STIs.            PAST MEDICAL HISTORY:      Class B diabetes.            OBSTETRICAL HISTORY:      Cesarean delivery x1.            MEDICATIONS:      Her insulin regimen where she takes 16 units of NPH in the morning, 7 units      of  regular with breakfast, 6 units of regular with dinner, and 8 units of      NPH at night.            ALLERGIES:      She has no known drug allergies.            FAMILY HISTORY:      Negative.            SOCIAL HISTORY:      Negative, and her living situation is as described as above.            PHYSICAL EXAMINATION:      VITAL SIGNS:  She is afebrile 97.0, pulse 73, blood pressure 133/76,  respiratory rate 18, glucose fingerstick was 80, fetal heart tones are      positive.      GENERAL:  She is in no acute distress, but she did feel careful with exam.            ABDOMEN:  Soft, nontender.  There is no CVA tenderness.      EXTREMITIES:  Reveal no edema, no calf tenderness.            ASSESSMENT AND PLAN:      A 30 year old G4, P2-0-1-2 at 14-5/7 weeks, here for dehydration and now      secondary to likely depression and thoughts of harm to self.  Plan to admit                                   Page 2 of 3      Kelsey Miles, Kelsey Miles      MRN:          [id number]      Account:      [id number]      Document ID:  [id number]                  to high-risk pregnancy.  Complete blood count, complete metabolic panel,      urinalysis, and urine ordered.  Intravenous hydration will be given. Rocephin      intravenously given that her urine culture was positive, and she had previously      been on Macrobid with no improvement.  She will be given Flexeril and a heating      pad for this back pain which I think is secondary to muscle spasm.  Given the      fact that she expressed thoughts of desperation and fleeting thoughts of harm      to herself per the nursing protocol, the patient must having  sitter and a      psychiatric consult.  I have personally called the psychiatrist on-call who      directed me to call [phone] to talk to one of the fellows for a consult but as it      is 7 o'clock on a Friday afternoon, the fellows are not available. Given that      this is not an urgent matter and there is no actual suicide attempt  that was      attempted, the plan is to call again [phone] in the morning in an attempt to talk      to Dr. Elpidio Anis and nurse practioner Willeen Cass in an attempt to get the      psychiatric consult. In the meantime, she will have a psychiatric sitter and be      given Ativan as needed. We will continue fasting fingersticks and 2-hour      postprandials. Continue her insulin regimen and her carbohydrate diet.  We will      maintain a sitter until a psychiatric evaluation states that it is or it is not      necessary and continue to monitor the patient closely.                        Electronic Signing Provider            D:  02/26/2011 19:39 PM by Dr. Chancy Milroy, MD ([id number])      T:  02/27/2011 08:35 AM by [id number]                  cc:                                   Page 3 of 3      Authenticated and Edited by Chancy Milroy, MD ([id number]) On 03/01/11 5:11:04 PM      Authenticated by Alma Downs, MD ([id number]) On 03/05/2011 11:08:54 AM

## 2011-08-11 NOTE — Discharge Summary (Signed)
Kelsey Miles, Kelsey Miles      MRN:          [id number]      Account:      [id number]      Document ID:  [id number]                  Admit Date: 02/26/2011      Discharge Date: 03/01/2011            ATTENDING PHYSICIAN:  Drucie Opitz, MD                  This discharge summary includes patient's hospital course from her date of      admission on Feb 26, 2011, to her date of discharge on Mar 01, 2011.            HOSPITAL COURSE:      This is a 30 year old, G4, P2-0-1-2, who was admitted on Feb 26, 2011, at      14 weeks and 5 days for dehydration, nausea, vomiting, and uncontrolled      class B diabetes and depression.  On admission, she received IV fluid      hydration.  She was found to have a UA, was positive for large leukocyte      esterase, 500 Glucola, +1 protein, so she was started on Rocephin IV.  She      was also reporting some depressive symptoms, so a psychiatric consult was      obtained, and the patient was started on a 1:1 sitter for possible suicidal      ideation.  For her class B diabetes, because the patient was not tolerating      p.o., her insulin was held, but she was continued to check fasting and      2-hour postprandial fingersticks.            On hospital day #1, she said she continued to have nausea and vomiting but      denied any vaginal bleeding or abdominal pain.  She had a nutrition consult      through recommend carb-consistent diet.  She was continued on Rocephin for      her UTI.  Her nausea and vomiting was followed, and she had a psychiatric      consult that agreed with the 1:1 sitter and it was he that would evaluate      the patient.            On hospital day #2, she was tolerating clears.  She had no acute signs or      symptoms of suicidal or homicidal ideation.  Her nausea and vomiting had      improved.  Her fingersticks, her fastings remained elevated at 180.  Her      postprandials were ranging from 100 to 157, and her 3 a.m. fingerstick was      91.  So given the fact  that patient was still on clears, we held off      restarting her insulin.  Her diet was advanced, and she was seen by      psychiatry, and they recommended that the fact that her suicidal ideation      had resolved, to discontinue the sitter, and that she would be followed up      as an outpatient.            On hospital day #3,  she was reporting some nausea but no  vomiting, was      tolerating a regular diet.  She remained afebrile.  Her fasting fingerstick      was 102.  Her postprandial was 114, 113 and 144.  So given the fact that      her nausea and vomiting had resolved, her depression symptoms were stable      and her class B diabetes was stable, was discharged home on hospital day      #3.  The patient was restarted on her home insulin regimen as she is      tolerating p.o. diet, now.  Is to follow up in high-risk clinic in 1 week                                   Page 1 of 2      Kelsey Miles, Kelsey Miles      MRN:          [id number]      Account:      [id number]      Document ID:  [id number]                  to monitor her fingersticks, and patient received 3 days of IV Rocephin      in-house.                        Electronic Signing Provider            D:  03/01/2011 10:17 AM by Dr. Burnell Blanks T. Carolin Sicks, MD ([id number])      T:  03/02/2011 08:34 AM by [id number]                        cc:                                   Page 2 of 2      Authenticated by Burnell Blanks T. Carolin Sicks, MD ([id number]) On 03/02/2011 10:49:59 AM      Authenticated by Nicholes Mango, MD ([id number]) On 03/11/2011 10:45:54 AM

## 2011-08-11 NOTE — Discharge Summary (Signed)
Kelsey Miles, Kelsey Miles      MRN:          [id number]      Account:      [id number]      Document ID:  [id number]                  Admit Date: 06/09/2011      Discharge Date: 06/17/2011            ATTENDING PHYSICIAN:  Sharia Reeve, MD                  HISTORY OF PRESENT ILLNESS:      This is a 30 year old G5, P2-0-2-2, who was admitted on June 10, 2011 at      29 weeks for a 2-day history of nausea, vomiting, and epigastric pain and      presented for rule out preeclampsia.  She was admitted for preeclamptic      labs, 24-hour urine protein, NSTs and urine culture.  She was started on      Procardia at that time.  Her pregnancy was complicated by an elevated blood      pressure on admission of 140/100, history of recurrent UTI, class B      diabetes, a history of a C-section, depression, and fibromyalgia.  During      her hospitalization, her blood pressures ranged from 140 to 170/80 to 100      for 2 days.  She was started on Procardia and her blood pressure went to      100s/60s.  The Procardia was discontinued during her hospitalization for      sedation.  She was only given 1 dose of Procardia and never received      Procardia again because her blood pressure completely normalized to      100s/70s for the rest of her hospitalization.  Her preeclamptic labs were      normal on admission.  Her AST and ALT were normal at 24 and 31.  Her      urinalysis was positive for protein and ketones and she had a urine culture      that was positive for Klebsiella.  She received vancomycin until her      subsequent urine culture was negative.  She had a right upper quadrant      sonogram that showed gallbladder sludge.  Her nausea and vomiting improved      with Zofran, Phenergan, and Protonix.  She originally was on IV doses and      she was transitioned to p.o. and PR doses over her hospitalization.  Her      insulin was originally held when she was n.p.o. but then she was restarted      and eventually tolerated a  carb-consistent diet and she was maintained on      NPH 8 units in the morning and 2 units of regular in the morning and 2      units with dinner.  She was continued on Neurontin for her fibromyalgia.      She had a psychiatry consultation for depression prior to admission.  She      was seen by social work on this day.  Her labs on discharge were  platelets      of 330, creatinine of 0.4, uric acid 4.1, ALT 15, AST 17, lipase 61.  She      had a repeat 24-hour urine protein, which was 452.  Her creatinine      clearance was 24.  Her urine culture on August 30 showed no growth.  Her      last ultrasound showed an EFW on September 4 of 1378 grams, which is the      24th percentile, breech presentation, AFI 16.6.  Dopplers 2.8, which were      normal.  She was asymptomatic on discharge, tolerating p.o., no nausea,      vomiting, no chest pain, shortness of breath, no headache, no right upper      quadrant pain, no scotomata, no swelling.  The patient was given discharge      instructions including to return to care for any signs or symptoms of      preeclampsia for intolerance of p.o. for any signs or symptoms of      hypoglycemia.  She has an appointment scheduled on Tuesday, September 11,      at 12:00 p.m. It was recommended by MFM that she receive AFI and NSTs                                   Page 1 of 2      Kelsey Miles, Kelsey Miles      MRN:          [id number]      Account:      [id number]      Document ID:  [id number]                  weekly until delivery due to her possible gestational hypertension versus      possible beginning of mild preeclampsia, although the protein in the urine      could be confounded by the severe UTI that she had on admission.  This      patient was discussed with Dr. Beryle Beams and the patient expressed verbal      understanding of all discharge instructions.                        Electronic Signing Provider            D:  06/17/2011 11:17 AM by Dr. Jon Gills C. Irven Shelling, MD ([id number])       T:  06/17/2011 19:58 PM by [id number]                        cc:                                   Page 2 of 2      Authenticated by Elizebeth Brooking. Irven Shelling, MD ([id number]) On 06/21/2011 01:49:25 PM      Authenticated by Sharia Reeve, MD ([id number]) On 07/21/2011 10:15:15 AM

## 2011-08-11 NOTE — H&P (Signed)
Kelsey Miles, Kelsey Miles      MRN:          [id number]      Account:      [id number]      Document ID:  [id number]                  Admit Date: 06/09/2011            Patient Location: [id number]      Patient Type: I            ATTENDING PHYSICIAN: Kristopher Glee, MD                  HISTORY OF PRESENT ILLNESS:      The patient is a 30 year old G5 P2-0-2-2 at 4 weeks and 4 days gestation      by last menstrual period of November 15, 2010, consistent with a 6 week      sonogram, estimated due date of August 22, 2011, presenting to [facility] labor and delivery triage unit for evaluation of a 2 to 3      day history of epigastric pain associated with nausea and vomiting.  The      patient notes 2 to 3 day history of acute onset epigastric pain described      as burning and cramping, mild in severity, associated with nausea and      vomiting.  The patient notes inability to tolerate p.o. for approximately a      24 hour period.  The patient denies contractions, leakage of fluid, vaginal      bleeding.  Patient notes good fetal movement.            REVIEW OF SYSTEMS:      The patient denies fevers, chills, headaches, visual changes,      lightheadedness, dizziness, shortness of breath, chest pain, palpitations,      diarrhea, constipation, dysuria, hematuria, increased urinary frequency,      purulent vaginal discharge.            Pregnancy complicated by:      1.  Class B diabetes:  Requires insulin for glucose control.  Most recent      insulin regimen as listed below.                NPH insulin 8 units subcutaneous injection q.a.m.                Regular insulin 2 units subcutaneous injection q.a.m.                Regular insulin 2 units at dinner.                NPH insulin 4 units subcutaneous injection Nightly.           The patient is status post EKG and fetal echocardiogram evaluation,           noted to be within normal limits.  Most recent hemoglobin A1c of 6.      2.  History of recurrent urinary  tract infections and pyelonephritis: Patient      previously discharged on multiple occasions with Keflex suppression; however,      patient notes that she is /"fine/" and no longer requires antibiotic      medication.  Most recent urine culture resulting in Staphylococcus epidermidis,      multidrug resistance.      3.  Intermittent elevated  blood pressures:  The patient has previously been      worked up for rule out preeclampsia on multiple admissions with negative      results.  Baseline 24-hour urine of 118 mg.  Baseline PIH labs within normal      limits.  Previous workup during inpatient admission for renal issues noted      negative results.  Status post catecholamine and metanephrine urine                                   Page 1 of 4      Kelsey Miles, Kelsey Miles      MRN:          [id number]      Account:      [id number]      Document ID:  [id number]                  evaluation for rule out pheochromocytosis negative.  The patient previously      on Procardia-XL 30 mg; however, medication discontinued on previous      admission.      4.  History of cesarean section:  2007, primary low transverse cesarean      section for failure to progress, 9 pound neonate.            OBSTETRICAL HISTORY:      1.  In 2004 first trimester spontaneous abortion, no dilatation and curettage      performed.      2.  In 2005, term, normal spontaneous vaginal delivery, no complications, 7      pounds.      3.  In 2007, term, primary low transverse cesarean section, failure to      progress, 9 pounds.            GYNECOLOGICAL HISTORY:      Regular menses, denies history of abnormal Pap smears, denies history of      sexually transmitted infections.            PAST MEDICAL HISTORY:      Urinary tract infections, pyelonephritis, class B diabetes, labile blood      pressures.            MEDICATIONS:      Insulin regimen as listed above, prenatal vitamins, Zofran, Phenergan.            ALLERGIES:      No known drug allergies.             PAST SURGICAL HISTORY:      In 2007, primary low transverse cesarean section.            FAMILY HISTORY:      Noncontributory.            SOCIAL HISTORY:      Denies alcohol use, denies tobacco use and denies illegal drug use.            PHYSICAL EXAMINATION:      VITAL SIGNS:   Temperature 97.5, blood pressure 149 to 173/91 to 109, heart      rate 104, respiratory rate 18.      FETAL HEART TRACING: 135 beats per minute, moderate variability, positive 10 x      10 accelerations, no decelerations.      TOCOMETER:  Positive irritability, no contractions noted.      GENERAL:  No acute  distress.      CARDIOVASCULAR:  Regular rate and rhythm.      PULMONARY:  Clear to auscultation bilaterally.      ABDOMEN:  Soft, nontender, gravid uterus.                                   Page 2 of 4      Kelsey Miles, Kelsey Miles      MRN:          [id number]      Account:      [id number]      Document ID:  [id number]                  EXTREMITIES:  No lower extremity edema, negative Homans sign.      PELVIC:  Deferred.            LABORATORY DATA:      CBC:  WBC 9.56; hemoglobin 12.4; hematocrit 35.1; platelet count 451,000.      Chemistries:  Sodium 133, potassium 3.9, chloride 87, bicarbonate 30, BUN 10,      creatinine 0.4, glucose 163.      Uric acid 6.3, ALT 24, AST 31.      Urinalysis:  3+ protein, 40 ketones, small leukocyte esterase, zero WBCs.      Urine culture:  Pending.            ASSESSMENT AND PLAN:      A 30 year old G4 P2-0-1-2 at 29 weeks and 4 days gestation by last menstrual      period, consistent with a 6-week sonogram, presenting for rule out preeclampsia.      1.  Rh positive, rubella immune, GBS negative.      2.  Category 1 fetal heart tracing, NST reactive for gestational age.      3.  Admit to HRP, diagnosis rule out preeclampsia, recurrent pyelonephritis.      4.  Bed rest with bathroom privileges.      5.  Vital signs per diagnosis protocol.      6.  Fetal heart tones with vital signs.      7.  NST b.i.d. and  p.r.n. contractions.      8.  Fasting blood sugar and 2-hour postprandial Accu-Cheks.      9.  Carbohydrate consistent diet.      10.  Rule out preeclampsia.  PIH labs as listed above, begin 24-hour collection      of urine for evaluation of 24-hour urine protein and creatinine clearance,      urine dipstick for protein q.a.m.      11.  Strict ins and outs each shift.      12.  Medications:   Prenatal vitamins 1 tablet p.o. every day,                          Colace 100 mg p.o. b.i.d.,                          Folic acid 1 mg p.o. every day,                          Maalox 30 mL p.o. q.3 h. p.r.n. indigestion,  Tylenol 650 mg p.o. q.6 h. p.r.n. pain,                          Pepcid 20 mg p.o. b.i.d. p.r.n. indigestion,                          Calcium carbonate 500 mg p.o. q.6 h. p.r.n. indigestion,                          Ambien 5 mg p.o. nightly p.r.n. insomnia, may repeat x1.                          Dilaudid 1 mg IV q.2-3 h. p.r.n. pain.                          Zofran 4 mg IV q.4 h. p.r.n. nausea, vomiting.                          Phenergan 25 mg IV q.8 h. p.r.n. nausea, vomiting.                          Continue insulin regimen:                               NPH insulin 8 units subcutaneous injection q.a.m.,                               NPH insulin 4 units subcutaneous injection nightly,                               Regular insulin 2 units subcutaneous injection                               every q.a.m.,                               Regular insulin 2 units subcutaneous injection at                               dinnertime.      13.  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 given once.                                   Page 3 of 4      Kelsey Miles, Kelsey Miles      MRN:          [id number]      Account:      [id number]      Document ID:   [id number]                  14.  Continue to monitor closely.  Patient history and plan discussed with Dr. Brunetta Genera and Dr. Evette Cristal,      Electronic Signing Provider            D:  06/10/2011 21:03 PM by Dr. Jarvis Morgan. Shaune Pascal, MD ([id number])      T:  06/10/2011 22:22 PM by [id number]                  cc:                                   Page 4 of 4      Authenticated and Edited by Jarvis Morgan. Shaune Pascal, MD ([id number]) On 07/01/11 8:20:42 AM      Authenticated by Kristopher Glee, MD [phone]) On 07/01/2011 12:08:20 PM

## 2011-08-13 LAB — ECG 12-LEAD
Atrial Rate: 67 {beats}/min
P Axis: 48 degrees
P-R Interval: 136 ms
Q-T Interval: 394 ms
QRS Duration: 88 ms
QTC Calculation (Bezet): 416 ms
R Axis: 262 degrees
T Axis: 29 degrees
Ventricular Rate: 67 {beats}/min

## 2011-08-28 NOTE — H&P (Signed)
Kelsey Miles, Kelsey Miles      MRN:          [id number]      Account:      [id number]      Document ID:  [id number]                  Admit Date: 03/07/2011            Patient Location: FW702-02      Patient Type: I            ATTENDING PHYSICIAN: [facility], MD                  HISTORY OF PRESENT ILLNESS:      The patient is a 30 year old G4, P2-0-1-2 at 16 weeks by LMP consistent      with a 12-week sonogram with an estimated due date of August 21, 2010,      who presents with lower quadrant back pain, but no fevers, no dysuria.  She      does report nausea and vomiting.  No vaginal bleeding or leakage of fluid.      No fetal movement yet.  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            GYNECOLOGIC HISTORY:      Unremarkable, regular menses.  No STIs.            OBSTETRICAL HISTORY:      In 2004, she had SAB.  In 2005, she had a full-term vaginal delivery to a      female weighing 7 pounds.  In 2007, she had a Cesarean delivery to a female      weighing 9 pounds, the rest of this is complicated by chorio.            PAST MEDICAL HISTORY:      Diabetes, class B.            PAST SURGICAL HISTORY:      Cesarean delivery x1.            MEDICATIONS:      Insulin, prenatal vitamins, but no antibiotics and she does take      Zofran at home.            ALLERGIES:      She has no known drug allergies.            FAMILY HISTORY:      Negative.            SOCIAL HISTORY:      Negative x3.                                         Page 1 of 2      Kelsey Miles, Kelsey Miles      MRN:          [id number]      Account:      [id number]      Document ID:  [id number]  REVIEW OF SYSTEMS:      Please see HPI.            PHYSICAL EXAMINATION:      VITAL SIGNS:  She is afebrile at 98.1, pulse 101, blood pressure  147/94,      respiratory rate 16, saturating 100% on room air.      GENERAL:  She is in no acute distress.  She is sitting up spitting up.      ABDOMEN:  Soft, nontender.  Her right CVA tenderness greater than left CVA      tenderness, although the patient is quite anxious and is expressing CVA      tenderness before even being examined.      EXTREMITIES:  No edema.            LABORATORY DATA:      White count is 14.9, H and H 13.3/37.4, platelets 430.  Her BNP is within      normal limits with a creatinine of 0.4.  ALT, AST of 15, 22.  Urinalysis is      positive for 2+ ketones, and 11 to 25 WBCs.            ASSESSMENT AND PLAN:      This is a 30 year old G4, P2-0-1-2 at 16 weeks, here with right CVA      tenderness and pain concerning for pyelonephritis.  Admit the patient to      [facility].  We will resume her insulin regimen and do fasting fingersticks as per      protocol.  We will start the patient on a course of gentamicin given that      this is her atleast second E. coli UTI in this pregnancy.  The two      previously E. coli UTIs were sensitive to Rocephin and her Klebsiella UTI      in the past that was multidrug resistant.  The patient has not been on      suppression and should be put on suppression after this admission.  She had      yet to have a urinalysis that shows that her infection has been cleared.  We      will also order a renal ultrasound given that she has had these continous      urinary tract infection.  I will place the patient on a carbohydrate consistent      diet.  We will resume her antimedications as needed.  Of note, on the patient's      last admission, she expressed passive thoughts of causing harm to herself and      she was seen by psych who cleared her at that time.  This patient was seen and      examined with Dr. Merita Norton who agrees with the plan.                                    D:  03/07/2011 02:29 AM by Dr. Chancy Milroy, MD ([id number])      T:  03/07/2011 10:55 AM by                   cc:                                   Page 2 of 2  Authenticated and Edited by Chancy Milroy, MD ([id number]) On 03/09/11 10:43:38 AM      Authenticated by Peggye Form, MD ([id number]) On 03/30/2011 02:42:01 PM

## 2011-08-28 NOTE — Discharge Summary (Signed)
Kelsey Miles, Kelsey Miles      MRN:          [id number]      Account:      [id number]      Document ID:  [id number]                  Admit Date: 03/31/2011      Discharge Date: 04/06/2011            ATTENDING PHYSICIAN:  Hyacinth Meeker, MD                  HISTORY OF PRESENT ILLNESS:      The patient is a 30 year old, G4, P2-0-1-2, initially admitted at 19 weeks      and 3 days gestation by LMP and a 12-week ultrasound with nausea, vomiting,      and inability to tolerate p.o. with the diagnosis of hyperemesis.  Please      see separately dictated history and physical examination for further      detail.  She is also a class B diabetic on NPH and regular insulin,      although she had not been taking insulin due to nausea, vomiting, and      inability to tolerate p.o.  She also has a history of UTI x2 in this      pregnancy with a negative urine culture on admission here.  She also has a      history of C-section x1 for chorioamnionitis and arrest of descent.            HOSPITAL COURSE:      During the course of this hospitalization, the patient received Rocephin x1      until the urine culture came back negative.  She was initially treated for      her hyperemesis with Protonix 40 mg IV, which was eventually switched over      to 20 mg p.o. b.i.d., Zofran 4 mg IV q.4 hours that was initially IV, then      switched over to p.o. and was well tolerated.  She also was maintained on      her Keflex 500 mg p.o. daily for UTI suppression, given her history of      pyelonephritis during this pregnancy.            Once the patient was tolerating p.o., she was restarted on her insulin.      Her home insulin regimen is 16 units of NPH in the morning, 7 units of      regular insulin with breakfast, 6 units of regular insulin with dinner, and      8 units of NPH at bedtime.  However, since her p.o. intake is still less      than her regular amounts, she will be discharged home on a decreased      regimen as listed below.             Her blood work, on June 22, she had a hemoglobin A1c that was 6.0, a      prealbumin of 16.5.  She had an H. pylori antibody test that was negative.      Her electrolytes were all normalized by the end of her hospitalization.      Her ALT was 21, AST 16, lipase 75.  Her blood pressures were slightly      elevated with most values 120s to 130s/70s to 80s with an occasional value  of 140/90; however, her PIH labs were all within normal limits, including a      CBC with a white count of 9.7, hemoglobin 10.3, hematocrit 30.3, platelets      455 and the rest of the laboratories as above.            On June 21, she had a right upper quadrant ultrasound that showed a normal      gallbladder and mild right renal pyelectasis.  She had an ultrasound on      June 19, which showed a subjectively normal AFI, cervical length of 4.7,                                   Page 1 of 2      Kelsey Miles, Kelsey Miles      MRN:          [id number]      Account:      [id number]      Document ID:  [id number]                  size equal to dates with an overall gestational age of [redacted] weeks at that      time.            DISCHARGE MEDICATIONS:      Include the following:  NPH insulin 8 units subcutaneously q.a.m. and 8      units subcutaneously nightly, regular insulin 2 units subcutaneously with      meals, Keflex 500 mg one tablet p.o. daily for UTI suppression, Protonix 40      mg one tablet p.o. b.i.d. for reflux, Phenergan 25 mg suppository one      tablet PR q.8 hours p.r.n. nausea, Reglan 10 mg one tablet p.o. q.6 hours      p.r.n. nausea.            Because the patient is not back to her full normal insulin regimen at this      time, she is being discharged home on this decreased regimen of 8N in the      morning, and 2R with meals and then 8N at night.            FOLLOWUP:      She will follow up within 5 days and to maintain glucose fingerstick log at      home so that her insulin may be titrated back up to her normal regimen.             The patient understands the importance of outpatient followup as well as      maintenance of her glucose log.  Signs and symptoms of hypoglycemia      reviewed.  All questions answered prior to discharge.  Routine precautions      reviewed.  There were no adverse events during the course of this patient's      hospitalization.            Plan of care and discharge discussed with MFM attending.                        Electronic Signing Provider            D:  04/06/2011 09:42 AM by Dr. Vinnie Langton. [place] MD ([id number])      T:  04/06/2011 14:21 PM by [id number]  cc:                                   Page 2 of 2      Authenticated and Edited by Vinnie Langton. Georgeann Oppenheim, MD ([id number]) On 04/07/11 2:40:20 PM      Authenticated by Manuela Schwartz, MD [phone]) On 05/06/2011 11:31:05 AM

## 2011-08-28 NOTE — H&P (Signed)
Kelsey Miles, Kelsey Miles      MRN:          [id number]      Account:      [id number]      Document ID:  [id number]                  Admit Date: 04/25/2011            Patient Location: NG295-28      Patient Type: I            ATTENDING PHYSICIAN: Charlott Rakes, MD                  HISTORY OF PRESENT ILLNESS:      The patient is a 30 year old G5, P2-0-2-2 at 23 weeks by LMP, consistent      with a 6 week sonogram, who presents with a 3-day history of suprapubic      pain, dysuria, back pain and nausea.  Denies any fevers, chills, chest      pain, shortness of breath.  Denies any vaginal bleeding or leakage of      fluid, endorses gestational fetal movement.  Pregnancy is complicated by:      1.  History of multiple UTIs and pyelonephritis in this pregnancy.  The      patient is currently on Keflex suppression and is status post admission to      [facility] in June.  She had a renal sonogram done on admission, which was normal.      2.  Class B diabetes.  The patient currently on insulin NPH 16 units in the      morning and 8 units before bedtime and regular insulin 7 units in the      morning and 6 units at dinnertime.  The patient's glucose is not well      controlled; however, she did not bring her glucose logs to the hospital.      The  patient has had a previous admission for glucose control during this      pregnancy.  Her hemoglobin A1c is 6.            PAST OBSTETRICAL HISTORY:      History of low transverse C-section for failure to progress at term.            OBSTETRICAL HISTORY:      In 2004, patient had a spontaneous abortion.  In 2005, patient had a term      spontaneous vaginal delivery, weighing 7 pounds at 40 weeks, no      complications.  In 2007, the patient had a low transverse C-section at 40      weeks for failure to progress with baby weighing 9 pounds, no other      complications.            GYNECOLOGICAL HISTORY:      Normal menses.  No history of any STDs or abnormal Pap smears.            PAST MEDICAL  HISTORY:      Frequent UTIs and pyelonephritis in pregnancy as well as class B diabetes.            PAST SURGICAL HISTORY:      Low transverse C-section.            MEDICATIONS:      Insulin NPH and regular regimen listed above, Keflex 500 mg daily, prenatal  Page 1 of 2      Kelsey Miles, Kelsey Miles      MRN:          [id number]      Account:      [id number]      Document ID:  [id number]                  vitamins and Zofran.            ALLERGIES:      No known drug allergies.            SOCIAL HISTORY:      Denies x3.            PHYSICAL EXAMINATION:      VITAL SIGNS:  Temperature 98.5.  Blood pressure 170/97, repeat was 130/87,      pulse 98, respirations 18.      GENERAL:  The patient appears uncomfortable.      CARDIOVASCULAR:  Regular rate and rhythm.      RESPIRATORY:  Clear to auscultation bilaterally.      ABDOMEN:  Soft.  Positive tender to palpation in suprapubic region.  No      right upper quadrant tenderness.  Positive left CVA tenderness.      PELVIC:  Sterile vaginal exam:  Cervix long and closed.            ASSESSMENT AND PLAN:      This is a 30 year old G5, P2-0-2-2 at 23 weeks with pyelonephritis.      1.  Admit to high risk pregnancy.      2.  Rocephin 1 gram IV q.24 hours.      3.  Intravenous fluids 125 mL per hour.      4.  Complete blood count, complete metabolic panel, uric acid.      5.  For her class B diabetes, we will maintain her on her home regimen of      insulin.      6.  Zofran and Dilaudid p.r.n.      7.  The plan was discussed with Dr. Londell Moh and Dr. Minna Antis.                        Electronic Signing Provider            D:  04/25/2011 21:27 PM by Dr. Marlana Salvage A. Montel Clock, MD ([id number])      T:  04/25/2011 22:04 PM by [id number]                  cc:                                   Page 2 of 2      Authenticated by Marlana Salvage A. Montel Clock, MD ([id number]) On 04/29/2011 01:27:20 AM      Authenticated by Charlott Rakes, MD ([id number]) On 05/02/2011 09:20:23 PM

## 2011-08-28 NOTE — Consults (Signed)
Kelsey Miles, Kelsey Miles      MRN:          [id number]      Account:      [id number]      Document ID:  [id number]      Service Date: 04/28/2011            Admit Date: 04/25/2011            Patient Location: [id number]      Patient Type: I            CONSULTING PHYSICIAN: Danie Chandler MD            REFERRING PHYSICIAN: Charlott Rakes MD                  CONSULTING SERVICE:      Nephrology            ATTENDING PHYSICIAN:      Charlott Rakes, MD            REASON FOR CONSULTATION:      Hypokalemia and hypertension.            HISTORY OF PRESENT ILLNESS:      This is a 30 year old lady originally from [place], gravida 5, para 2 who is      currently [redacted] weeks pregnant.  This is her fifth hospitalization since May      2012.  Previous hospitalizations were for uncontrolled diabetes, nausea,      vomiting, gastroenteritis and urinary tract infection on 2 separate      hospital admissions.  Of note, during this hospital stay, the patient's      blood pressure was reported to be normal.  She was readmitted with a 3-day      history of suprapubic pain, back pain, dysuria, nausea, and vomiting.  No      fever or chills, vaginal bleeding.  She was subsequently admitted and      during the course of her hospitalization, was noted to be hypokalemic and      her blood pressure also has been elevated, and we have therefore been asked      to see her to assist in her management and workup.  She currently reports      that she continues to have low back pain as well as lower abdominal pain.      She is still nauseous but denies having any recent vomiting since her      hospital admission.            PAST MEDICAL HISTORY:      1.  Diabetes mellitus type 2.  She reportedly had a hemoglobin A1c of 6.      2.  Recurrent UTIs, currently on Keflex in suppressive doses.            OBSTETRIC HISTORY:      First trimester spontaneous abortion in 2004.  Low transverse cesarean      section in 2007.            FAMILY HISTORY:      No  hypertension, diabetes, or kidney disease.                                   Page 1 of 3      TOTO JUAQUINA, MACHNIK      MRN:  [id number]      Account:      [id number]      Document ID:  [id number]      Service Date: 04/28/2011                  ALLERGIES:      None.            MEDICATIONS:      NPH, Keflex, prenatal vitamins and Zofran on admission.            SOCIAL HISTORY:      Denies tobacco or alcohol use.            REVIEW OF SYSTEMS:      GENERAL:  Nausea, vomiting. No fever or chills.      HEENT:  No visual complaints, swallowing difficulties, sore throat.      CARDIAC:  No chest pain or palpitations.      PULMONARY:  No cough, shortness of breath, or wheezing.      GENITOURINARY:  She has had flank pain, lower abdominal pain, but denies      having any gross hematuria or dysuria.      SKIN:  No rashes.      CENTRAL NERVOUS SYSTEM:  No headaches, numbness, or paresthesias.      GASTROINTESTINAL:  Nausea, vomiting.  No diarrhea.            PHYSICAL EXAMINATION:      GENERAL:  A well-developed, well-nourished female who is awake, alert, not      in any distress.      VITAL SIGNS:  Blood pressure 133/68, heart rate 76, highest blood pressure      recorded today was 195/102.  Temperature was 98.2.      HEENT:  Normocephalic, conjunctivae pink, sclerae anicteric.      NECK:  Supple, no JVD.      LUNGS:  Clear to auscultation.      CARDIAC:  Normal heart sounds, regular rate and rhythm.      ABDOMEN:  Soft, nontender, normoactive bowel sounds.      EXTREMITIES:  No edema.      CENTRAL NERVOUS SYSTEM:  Nonfocal.            LABORATORY DATA:      WBC 10.45, hemoglobin 10.1, hematocrit 29.7, platelets 323.  Sodium 134,      potassium 4.0, chloride 101, CO2 26, BUN 3, creatinine 0.3, glucose of 31.      On admission, she had a serum potassium of 2.8 and received 40 mEq of      potassium chloride.  Renal sonogram showed normal abdomen and kidneys.            IMPRESSION:      1.  Hypokalemia, probably secondary  to nausea, vomiting, decreased oral      intake, and insulin administration.      2.  Hypertension, rule out hyperaldosteronism versus preeclampsia.      3.  Recurrent urinary tract infections.                                   Page 2 of 3      CHRISTAN, CICCARELLI      MRN:          [id number]      Account:      [id number]      Document ID:  [id number]      Service Date: 04/28/2011            4.  Diabetes mellitus type 2.      5.  23-week intrauterine pregnancy.            RECOMMENDATIONS:      1.  Check plasma, renin and aldosterone levels.      2.  Liberalize his dietary potassium intake.      3.  Maintain her on an ADA diet.      4.  I agree with continued antibiotics.      5.  I agree with hydralazine for her blood pressure.      6.  Follow chemistries.      7.  Check urine electrolytes, serum magnesium level.      8.  Replete her potassium with oral potassium chloride as needed.            Thank you for the consult.  We will follow her closely with you.                        Electronic Signing Provider            D:  04/28/2011 21:21 PM by Dr. Danie Chandler, MD [phone])      T:  04/28/2011 22:41 PM by [facility]                  cc:                                   Page 3 of 3      Authenticated by Danie Chandler, MD [phone]) On 05/17/2011 11:40:50 AM

## 2011-08-28 NOTE — Discharge Summary (Signed)
Kelsey Miles, Kelsey Miles      MRN:          [id number]      Account:      [id number]      Document ID:  [id number]                  Admit Date: 05/17/2011      Discharge Date: 05/20/2011            ATTENDING PHYSICIAN:  Hyacinth Meeker, MD                  HISTORY OF PRESENT ILLNESS:      This is a 30 year old gravida 4, para 2-0-1-2 at 26 weeks and 3 days who      was admitted on May 18, 2011, for a 1-day history of lower back pain.      She has also had associated nausea and vomiting.  When the patient called      for EMS transport, the paramedics noted a blood pressure in the 200s/100s.      The patient was admitted initially monitored on labor and delivery for high      blood pressures for which she was started on Procardia 30 XL daily, the      first dose given on the evening of May 18, 2011, and hydralazine 5 mg IV      p.r.n. Her blood pressures remained elevated, 150s to 190s/90s to 110.  She      was ruled out for preeclampsia by 24-hour urine, which was negative, as      well as PIH labs which were done on admission and then the following      morning, which were also all negative.  She was treated with antiemetics      and her nausea improved.            After transfer to high-risk perinatology, she had 1 blood pressure of      140/90 and then her blood pressures normalized to 90s to 110s/50s to 70s.      At this time, her Procardia was discontinued and she was observed for      another 24 hours to make sure that her blood pressures remained normal.      The patient has not had prior hypertension but has had some gestational      hypertension in this pregnancy.            Her nausea and vomiting improved.  She was able to tolerate a diet and her      blood sugars remained within normal limits.  She was receiving 8 units of      NPH in the morning and 8 units of NPH in the evening.  I confirmed with her      home regimen for insulin, which is different than what was dictated on her      admission H and P.   She reports that she takes 4 additional units of R      every time her postprandial blood sugars are greater than 150 and then she      takes 8 units of NPH both morning and night and she has had good control at      home.  She had not had a fetal echocardiogram and so one was ordered for      this hospitalization, which was normal.  The back pain and headaches had not improved, however, and so we spent some      time discussing these symptoms.  The patient gives a good history of      fibromyalgia.  She states that her pain has started about a year or 2 ago      and is generalized over her entire body and described as tingling.  She has      had pain in her lower extremities, her lower back and hips, her back, her      neck, and persistent headaches, these symptoms have been so severe that she      has been unable to work and her husband has to do most of the daily chores      at home, including cooking, cleaning, and laundry.  She was taking                                   Page 1 of 2      Kelsey Miles, Kelsey Miles      MRN:          [id number]      Account:      [id number]      Document ID:  [id number]                  Neurontin 300 t.i.d. prior to pregnancy but discontinued it at the start of      the pregnancy and she has noted that during this pregnancy her pain      symptoms have worsened to the point where she is really not able to      complete her activities of daily living.  We discussed the risks and      benefits of taking Neurontin and how her symptoms have really limited her      quality of life.  It is a category C medication and while we do not know if      it 100% safe, it is a medication that we do use in pregnancy when the      benefits outweigh the risks.  The patient is going to continue to take her      Neurontin 300 mg t.i.d.  She is also going to continue her insulin regimen      as previously described and she will continue to monitor her blood      pressures and will notify the  clinic if there is any elevation greater than      150/90.  She has been instructed to follow up in the high-risk clinic on      Tuesday.            DISCHARGE MEDICATIONS:      Keflex for suppression 500 mg daily, Neurontin 300 mg t.i.d., antiemetics      as previously prescribed, and prenatal vitamins.                        Electronic Signing Provider            D:  05/20/2011 15:30 PM by Dr. Efraim Kaufmann B. [place] Reid[place] ([id number])      T:  05/20/2011 23:19 PM by [id number]                        cc:  Page 2 of 2      Authenticated by Efraim Kaufmann B. Lorrene Reid, MD ([id number]) On 05/21/2011 03:20:10 PM      Authenticated by Manuela Schwartz, MD [phone]) On 05/24/2011 02:25:39 PM

## 2011-08-28 NOTE — Discharge Summary (Signed)
Kelsey Miles, Kelsey Miles      MRN:          [id number]      Account:      [id number]      Document ID:  [id number]                  Admit Date: 05/28/2011      Discharge Date: 05/28/2011            ATTENDING PHYSICIAN:  Kristopher Glee, MD                  HISTORY OF PRESENT ILLNESS:      The patient is a 30 year old G4, P2-0-2-2, at 80 weeks' and 5 days'      gestational age by last menstrual period, consistent with a 6-week sonogram      with an estimated due date of August 22, 2011, who is well known to the      antepartum service who presents for recurrent episode of nausea and      vomiting and low back pain.  The patient has had multiple admissions on the      high risk perinatal unit during this pregnancy for similar symptoms.  The      patient is also known to have labile blood pressures that also are within      normal limits but can go as high as 170s over 90s for which she has      received extensive workup by nephrology and had been ruled out for other      causes of hypertension.  The patient has had multiple urinary tract      infections and pyelonephritis in this pregnancy.  Most recently in her last      admission, the patient was treated initially for pyelonephritis but had a      negative urine culture at which time, antibiotics were stopped.  The      patient had presented with similar symptoms at that time of low back pain      and refractory nausea and vomiting.  This patient has been on Keflex      suppression since then to prevent additional episodes of pyelonephritis or      urinary tract infections.  The patient was also ruled out for preeclampsia      at that time with a 24-hour urine that demonstrated 118 mg of protein.  The      patient has had serial preeclamptic labs with pH labs that were normal on      serial exams.  After extensive evaluation during that hospitalization, the      patient was discharged home with pain medication for pain of unknown      etiology and Zofran for nausea,  which patient has been taking at home.            The patient presents today with worsening of her nausea, vomiting over the      last 36 hours.  The patient states that since yesterday morning, the      patient has been unable to tolerate anything p.o.  The patient states she      has had multiple episodes of emesis which are too many to count, but denies      hematemesis.  The patient also denies bilious emesis.  The patient denies      any abdominal pain.  However, patient states that after several episodes of  emesis.  The patient does report burning in her chest.  The patient      continues to also have worsening low back pain.  The patient states that it      does not radiate to the flank or to the stomach.  The patient's pain is      constant.  It does not come and go.  The patient denies any contractions.      The patient denies any dysuria.  The patient denies any leakage of fluid or      vaginal bleeding.  The patient states that she last took her insulin last      night, has not taken any insulin today as she is a class B diabetic.  The      patient states that her blood sugars were okay yesterday, but was not      taking them today she felt so well.  The patient states she feels very weak                                   Page 1 of 4      Kelsey Miles, Kelsey Miles      MRN:          [id number]      Account:      [id number]      Document ID:  [id number]                  and was brought in by ambulance for treatment of her worsening nausea,      vomiting, and low back pain.  The patient denies any fever or chills at      home.  Of note, patient lives with her husband and her children, and she      feels that she has support at home.  The patient states she has been taking      some type of pain medication that she was discharged from her last      hospitalization, but cannot remember the name of the medication or the      dosage.  The patient states she has also been taking Zofran as prescribed       without any improvement of her nausea at home.            Pregnancy is complicated by:      1.  Refractory nausea, vomiting:  The patient states that she has baseline      nausea, vomiting at home but at time it severely worsens and seems to      correlate with her pain.  The patient has been ruled out for preeclampsia      on her multiple admissions.      2.  Intermittently elevated blood pressures during this pregnancy:  The      patient has been ruled out for preeclampsia on multiple occasions with      baseline 24-hour urine of 118 mg during her last hospitalization 3 weeks      ago.  The patient's blood pressures are very labile for which nephrology      was consulted.  The patient did have a renal sonogram that was within      normal limits.  The patient also had catecholamines and metanephrine that      were noted to be within normal limits to rule out pheochromocytoma.  The  patient had been given Procardia 30 XL at some point during her      hospitalization which was discontinued.  The patient has been taking her      blood pressures at home to monitor.  The patient also had aldosterone,      renin, and a.m. cortisol which were all within normal limits; and      nephrology's workup did not reveal any intrinsic kidney disease.      3.  Class B diabetes for which patient is taking insulin:  The patient has      not had great control of her blood sugars and is currently taking a low      dose of 6 units of N, 2 units of R, 2 units of R, and 6 units of N.  The      patient did have a normal fetal echocardiogram performed.  The patient's      last hemoglobin A1c was 6.      4.  History of urinary tract infections and pyelonephritis for which      patient is on Keflex suppression.      5.  History of prior C-section for failure to progress at term.            PHYSICAL EXAMINATION:      VITAL SIGNS:  Blood pressure 143/95.  Blood pressure as high as 168/100.      However, blood pressure prior to discharge  home was 144/85.      FETAL HEART MONITORING:  Baseline 145, moderate variability, positive 10 x      10 accelerations, reactive, no decelerations.  Reactive for gestational      age.      TOCOMETRY:  Rare contractions.      GENERAL:  The patient appears fatigued and was vomiting clear emesis upon      admission to triage.      CARDIOVASCULAR:  Mildly tachycardic.                                   Page 2 of 4      Kelsey Miles, Kelsey Miles      MRN:          [id number]      Account:      [id number]      Document ID:  [id number]                  LUNGS:  Clear to auscultation bilaterally.      ABDOMEN:  Soft, nontender, gravid.  No rebound or guarding.      BACK:  No CVA tenderness bilaterally.  However, patient did endorse lower      back consistent with musculoskeletal pain.      VAGINAL:  Deferred.      EXTREMITIES:  No lower extremity edema.            LABORATORY DATA:      CBC with a white count of 9.89, hemoglobin 10.4, hematocrit 31.3, platelets      101.  Basic metabolic panel with a sodium of 136, potassium 3.7, chloride      99, bicarbonate 26, BUN 6, creatinine 0.3, glucose 101.  Uric acid 4.4, ALT      20, AST 22, LDH 430.  Urinalysis had greater than 80.  Ketone is, +1 +2      proteinuria, negative leukocyte esterase, negative  nitrites, negative      blood, 0 to 5 white blood cells, 0 to 5 squamous epithelial cells.  Urine      culture pending.            HOSPITAL COURSE:      The patient is a 30 year old G50, P2-0-2-2, at 42 weeks' and 5 days'      gestational age by last menstrual period, consistent with a 6-week sonogram      with an estimated due date of August 22, 2011, who was brought in by      ambulance for worsening of nausea, vomiting, and low back pain over the      last 36 hours and an inability to tolerate p.o.  The patient had a negative      urinalysis for urinary tract infection, and there was no evidence of      pyelonephritis.  The patient was afebrile and without any CVA tenderness on       exam.  The patient did have ketones in her urine that demonstrated      dehydration for which she was rehydrated 2 liters of IV fluid in triage      with improvement of her symptoms.  Despite a history of multiple episodes      of emesis, patient did not have any derangement in her electrolytes and      patient was not hypokalemic.  The patient was given Zofran 4 mg IV,      Phenergan 25 mg IM, and Dilaudid 1 mg IV for nausea and pain respectively.      These medications resulted in good effects.  The patient was only taking      Zofran at home as an antiemetic, which was not controlling her nausea.  The      patient's low back pain is of unknown etiology and has been extensively      worked up on prior hospitalizations.  The patient also endorses a history      of pain prior to this pregnancy.  The patient is taking pain medications at      home, but does not remember the name or dose.  The patient was given a      prescription for Percocet for 6 tablets to be taken to carry her over until      her appointment in the clinic on Tuesday in the high-risk clinic.  The      patient did have mildly elevated blood pressures of 140s/90s that increases      further with nausea, vomiting, and distress.  As previously mentioned, this      was then extensively worked up on her prior hospitalizations and will      continue to monitor closely for preeclampsia.  The patient does not have      any other signs or symptoms of preeclampsia, other than nausea, vomiting,      which has been persistent.  The patient had normal preeclamptic labs today      in triage and normal platelets.  Blood pressures appear to be stable from                                   Page 3 of 4      Kelsey Miles, Kelsey Miles      MRN:          [id number]  Account:      [id number]      Document ID:  [id number]                  her prior hospitalization.  The patient did have a 24-hour urine protein on      her last hospitalization that was 180 mg of  protein.  For treatment of her      nausea and vomiting, the patient was given a prescription to take      doxylamine 12.5 mg every 8 hours and vitamin B6 25 mg every 8 hours      around-the-clock.  The patient was instructed to take these      around-the-clock rather than p.r.n. for control of her baseline nausea.      The patient was also given a prescription for promethazine suppositories 25      mg per rectum every 6 hours to be taken as needed for nausea.  The patient      was given a prescription for Reglan 10 mg to be taken every 8 hours as      needed for nausea, as well since it appeared that this medication was      effective during her last hospitalization for control of her nausea.  The      patient was continued on her Zofran, which she has at home.  The patient      was encouraged to adhere to a bland diet.  The patient did pass a p.o.      trial prior to discharge home.  The patient was strongly encouraged to      continue checking her sugars regularly, at least 4 times a day and even      more so when she is having frequent nausea and vomiting.  The patient was      instructed to continue taking her insulin regimen as previously prescribed,      but to abstain from her insulin if she has significant nausea and vomiting      to avoid hypoglycemia.  The patient's blood sugar here was 94 and      reassuring.  The patient's fetal heart tracing was reactive and reassuring,      and there was no evidence of preterm labor.  The patient was given      instructions to return to the hospital if she had worsening of her nausea      or vomiting or developed signs and symptoms of preeclampsia including      headache, vision changes, right upper quadrant pain, inability to tolerate      p.o., contractions, leakage of fluids, or decreased fetal movement.  With      those instructions, the patient was discharged home in stable condition.      The patient already has an appointment set up in the clinic in high-risk       clinic for this Tuesday, August 21, and was strongly encouraged to attend      so that we can follow her closely during her pregnancy.                        Electronic Signing Provider            D:  05/28/2011 22:49 PM by Dr. Kristine Linea. Doretha Imus, MD ([id number])      T:  05/29/2011 23:37 PM by [id number]  cc:                                   Page 4 of 4      Authenticated by Kristopher Glee, MD [phone]) On 05/30/2011 02:02:22 AM      Authenticated by Kristine Linea. Doretha Imus, MD ([id number]) On 05/31/2011 07:21:05 PM

## 2011-08-28 NOTE — Discharge Summary (Signed)
Kelsey Miles, ABASCAL      MRN:          [id number]      Account:      [id number]      Document ID:  [id number]                  Admit Date: 03/07/2011      Discharge Date: 03/12/2011            ATTENDING PHYSICIAN:  [facility], MD                  HISTORY OF PRESENT ILLNESS:      A 30 year old G47, P2-0-1-2 at 16 weeks and 0 days gestation by an [facility] of      August 22, 2011, by LMP and a week sonogram, presenting with back pain      and a UTI with possible pyelonephritis due to right CVA tenderness, was      admitted to [facility] for antibiotics.            HOSPITAL COURSE:      A 30 year old G4, P2-0-1-2 at [redacted] weeks gestation, admitted for possible      pyelonephritis, UTI, class B diabetes, was admitted to [facility].            The patient was started on gentamicin due to prior urine cultures.  She had      improvement of her back pain slowly over the course of her first 3 or 4      hospital days.  Her urine culture ended up with Staphylococcus 30,000 to      50,000, coagulase-negative.  She was switched to azithromycin for this      reason and she was kept on a full 7-day course to treat her      UTI/pyelonephritis.            Also during this admission, she had class B diabetes and her previous      insulin dose at home was 16N7R in the morning and then 6R before dinner and      8 NPH at night.  The patient was having increased nausea and vomiting due      to her infection, and so she was decreased down to [facility] in the morning and      4 NPH at night while she was in the hospital.            She continued to have some nausea and vomiting throughout her hospital      course, and so she was given IV Zofran, IV Phenergan and IV Reglan until      she improved and she was able to tolerate p.o. antiemetics.  At the time of      her discharge, she was sent home with Zofran p.o., Phenergan p.r./p.o. and      Reglan p.o. p.r.n. for nausea.  Nausea has been a problem throughout her      pregnancy.            The patient  also had some elevated blood pressures while she was in-house.      Her pressures ranged from the 120s to the 150s/80s to 90s and she may need      an outpatient 24-hour urine if her blood pressures continue to be elevated.       Most likely this is due to chronic hypertension, but she has no history of  this.  She could not get a 24-hour urine in-house due to her acute urinary      tract infection.            DISCHARGE INSTRUCTIONS:      FOLLOWUP:                                   Page 1 of 2      [name], [name]      MRN:          [id number]      Account:      [id number]      Document ID:  [id number]                  The patient was to follow up in the clinic in 1 to 2 weeks.            DISCHARGE MEDICATIONS:      She was to take Macrobid 100 mg p.o. daily for prophylaxis of UTIs.  She      was to continue on her insulin at home as previously prescribed, and she      was to continue checking her blood sugars.                                    D:  03/19/2011 09:58 AM by Dr. Olegario Messier L. Lucretia Roers, MD ([id number])      T:  03/20/2011 05:14 AM by [id number]                        cc:                                   Page 2 of 2      Authenticated by Darcella Gasman. Lucretia Roers, MD ([id number]) On 03/23/2011 10:28:07 AM

## 2011-08-28 NOTE — H&P (Signed)
Kelsey Miles, Kelsey Miles      MRN:          [id number]      Account:      [id number]      Document ID:  [id number]                  Admit Date: 03/31/2011            Patient Location: FW702-01      Patient Type: I            ATTENDING PHYSICIAN: Physician [facility], MD                  HISTORY OF PRESENT ILLNESS:      The patient is a 30 year old G4, P2-0-1-2 at 19 weeks and 3 days by LMP      consistent with a 12-week sonogram with an [facility] of August 22, 2011, who      presents to the emergency department with nausea and vomiting for the past      3 days.  The patient also complains of abdominal cramping, dysuria, and      lower back pain.  The patient reports the nausea and vomiting started 3      days ago and 1 week ago.  The patient also reports diarrhea, which has      since resolved.  She denies any fevers or chills.  The patient also reports      that her 75-year-old daughter also has been having nausea and vomiting as      well as diarrhea for the past 4 to 5 days.  The patient's pregnancy is      complicated by:      1.  Class B diabetes.  The patient is on insulin, was admitted twice before      for blood sugar control.  The patient states her blood sugars have been      well controlled.  She has not been taking her insulin for the past 3 days      due to continuous nausea and vomiting, and inability to tolerate p.o.  She      has a fetal echocardiogram scheduled on June 20 at [facility].      2.  Prior low transverse cesarean section.      3.  History of multiple UTIs and admission for pyelonephritis, most      recently on May 27.  The patient is currently on Keflex suppression.            OBSTETRICAL HISTORY:      In 2004, first trimester spontaneous abortion.  In 2005, normal spontaneous      vaginal delivery at term, baby weighing 7 pounds.  In 2007, a primary low      transverse cesarean section at term for arrest of dilation, baby weighing 9      pounds.  Delivery complicated by chorioamnionitis.             GYNECOLOGIC HISTORY:      The patient reports regular menses.  No history of STDs or abnormal Pap      smears.            PAST MEDICAL HISTORY:      Class B diabetes.            PAST SURGICAL HISTORY:      A cesarean section in 2007.  MEDICATIONS:                                   Page 1 of 3      Kelsey Miles, Kelsey Miles      MRN:          [id number]      Account:      [id number]      Document ID:  [id number]                  Keflex 500 mg daily, promethazine 12.5 mg every 6 hours as needed for      nausea.  Insulin regimen:  NPH 16 units in the morning and 8 units at      night; regular insulin 7 units before breakfast, 6 units before dinner.            ALLERGIES:      No known drug allergies.            SOCIAL HISTORY:      Negative x3.            PHYSICAL EXAMINATION:      VITAL SIGNS:  Stable in the ER, afebrile.      GENERAL:  No acute distress, lethargic.      ABDOMEN:  Soft, nontender.  Fundal height U minus 1, positive mild CVA      tenderness bilaterally.      EXTREMITIES:  No edema or tenderness.      PELVIC:  Cervix is long with closed internal os.  Fetal heart tones 151      beats per minute.            LABORATORY DATA:      WBC 9.71, hemoglobin 10.3, hematocrit 30.3, platelets 455.  Sodium 136,      potassium 3.2, chloride 101, bicarbonate 24, BUN 6, creatinine 0.4, glucose      99, alkaline phosphatase 62, ALT 21, AST 16, lipase 75.  UA:  Greater than      80 ketones, trace leukocyte esterase, negative nitrites, no white blood      cells, 0 to 5 epithelial cells.            ASSESSMENT AND PLAN:      A 30 year old G4, P2-0-1-2 at 19 weeks and 3 days.  Pregnancy complicated      by class B diabetes with most likely gastroenteritis, inability to tolerate      p.o. and also with chronic urinary tract infection.            We will admit patient to [facility].  The patient will be n.p.o.  We will give her      intravenous fluids with strict I's and O's.  For the nausea and vomiting,      will give Zofran  and Phenergan around-the-clock.  Will also start Protonix once      daily.  For the hypokalemia, patient was repleted in the ER with potassium.  We      will recheck her BMP in the morning.  Chronic UTI:  The patient received a dose      of Rocephin in the ED, will continue Rocephin at this time as patient is unable      to tolerate p.o.  Once tolerating p.o. patient can then be transitioned to her      suppression dose of Keflex.  The patient  also was scheduled for a fetal      echocardiogram at [facility], March 31, 2011.  Will check to see if patient can still      keep this appointment.                                                     Page 2 of 3      Kelsey Miles, Kelsey Miles      MRN:          [id number]      Account:      [id number]      Document ID:  [id number]                              D:  03/31/2011 03:04 AM by Dr. Wilmon Arms, MD ([id number])      T:  03/31/2011 09:44 AM by [id number]                  cc:                                   Page 3 of 3      Authenticated and Edited by Wilmon Arms, MD ([id number]) On 04/01/11 2:03:40 AM      Authenticated by Jeanett Schlein, MD [phone]) On 04/03/2011 06:53:05 AM

## 2011-08-28 NOTE — Discharge Summary (Signed)
Kelsey Miles, Kelsey Miles      MRN:          [id number]      Account:      [id number]      Document ID:  [id number]                  Admit Date: 04/25/2011      Discharge Date: 05/01/2011            ATTENDING PHYSICIAN:  Charlott Rakes, MD                  HISTORY OF PRESENT ILLNESS:      The patient is a 30 year old G5, P2-0-2-2 at 23 weeks dated by last      menstrual period consistent with a 6-week ultrasound, who presented with      3-day history of suprapubic pain, dysuria, back pain, and nausea.  She      denied fevers, chills, chest pain, shortness of breath, vaginal bleeding,      leakage of fluid, and reported good fetal movement.  Pregnancy was      complicated by history of multiple UTIs and pyelonephritis in the pregnancy      where she was on Keflex suppression and also a history of class B diabetes      where she was on insulin NPH 16 units in the morning and 8 units at bedtime      and regular insulin 7 units in the morning and 6 units at dinnertime, she      did not bring her blood glucose logs to the hospital; however, had had a      previous admission for blood glucose control previously in the pregnancy.      Her hemoglobin A1c was 6.            PHYSICAL EXAMINATION:      VITAL SIGNS:  Showed a blood pressure of 170/97 with repeat of 130/87,      temperature of 98.5, pulse of 98, respirations of 18.  Exam on admission      was significant for tenderness in the suprapubic region and some mild left      CVA tenderness.  At that time, the patient was admitted to the high risk      perinatal unit.            HOSPITAL COURSE:      She was given Rocephin 1 gram daily, IV fluids.  PIH labs were checked that      were within normal limits except for elevated uric acid of 4.5, but later      in the hospital stay this came down to 2.8 and PIH labs remained within      normal limits with LFTs in the 20s, creatinine between 0.3 and 0.4, and LDH      within normal limits.  Regarding the patient's nausea, vomiting,  and pain,      she was placed on Zofran and Dilaudid.  The patient was then transitioned      from Zofran  and Phenergan was added.  The nausea was not that well      controlled, so a trial of Reglan was undertaken, which the patient did      better on on her last day of hospitalization.  The patient's pain was      intermittent but did improve throughout the hospital course.  Initially she      was requiring  IV Dilaudid and then later was transitioned to Tylenol and      then oral Dilaudid.  The patient's Rocephin was stopped on hospital day #3,      as the urine culture did not show any growth, and it was thought that the      patient may not have actually had pyelonephritis.  Consultation with      nephrology was made as the patient intermittently spiked elevated blood      pressures into the 170s over 100.  This normally occurred in the afternoon.       At times this was associated with pain at other times was not.  Also the                                   Page 1 of 2      Kelsey Miles, Kelsey Miles      MRN:          [id number]      Account:      [id number]      Document ID:  [id number]                  patient's labs are significant for hypokalemia with the patient received      potassium repletion on both hospital day 1 where she got 4 rounds of KCl      and also hospital day 3 where she got 4 rounds of KCl which she responded      to.  Nephrology ordered aldosterone, rennin and a.m. cortisol which were      all within normal limits and thought that this was a reactive elevated      blood pressure, but not associated with any intrinsic kidney disease.  The      patient also underwent a 24-hour collection of urine that showed 118 mg of      protein with creatinine clearance of 96 and also 24-hour collection to      assess for pheochromocytoma where catecholamines and metanephrines were      noted to be within normal limits.  The patient on hospital day #2 also was      given a bowel regimen including Dulcolax and  MiraLax and reported that this      did help improve her pain.  She also underwent an abdominal ultrasound and      renal ultrasound were within normal limits.  Regarding the patient's class      B diabetes, she was placed on a consistent carbohydrate diet and insulin      regimen of 16, 7, 6, and 8 as previously mentioned,  However, her hospital      course was significant for hypoglycemia as the patient was not taking in a      lot of p.o. secondary to the nausea.  Therefore, she had low blood sugar on      hospital day #2 and #3, where she was given juice and also D5 LR.  Given      that the patient's regular insulin was held and her NPH insulin was halved      to 8 units in the morning and 4 units at night.  The patient underwent a      growth and anatomy ultrasound of the fetus that showed the growth in the      18th percentile with [facility] in the 2nd  percentile with 471 grams for the EFW.      The cervical length was 4, amniotic fluid was normal subjectively, and the      anatomy was within normal limits except the spine was poorly visualized.      Given the patient's persistent symptoms, a urine toxicology was checked,      which was noted to be within normal limits.            On hospital day #6, the patient had reported feeling better.  She was      without nausea, vomiting, or pain.  Therefore, the patient was discharged      to home with instructions to follow up in high-risk clinic the following      Tuesday.            R1:  jg 05/25/2011                        Electronic Signing Provider            D:  05/24/2011 10:33 AM by Dr. Lehman Prom, MD ([id number])      T:  05/24/2011 23:10 PM by [id number]                        cc:                                   Page 2 of 2      Authenticated by Felizardo Hoffmann, MD ([id number]) On 05/28/2011 03:48:20 PM      Authenticated by Charlott Rakes, MD ([id number]) On 06/02/2011 11:11:44 AM

## 2011-09-12 ENCOUNTER — Ambulatory Visit (HOSPITAL_BASED_OUTPATIENT_CLINIC_OR_DEPARTMENT_OTHER): Payer: Self-pay

## 2011-09-28 NOTE — Procedures (Signed)
[facility]      ,            Fetal Echocardiogram      2D, Doppler, and Color Doppler      Study date:  20-May-2011            Patient: Kelsey Miles      MR #: [id number]      Account #: [id number]      DOB: Oct 20, 1980      Age: 30 years      Gender: Female      Height:      Weight:      BSA:            PedsCardiologist:  Annamaria Helling, MD      Sonographer:  Gasper Lloyd, BS, RDCS            PROCEDURE: The study was performed in the W639. This was a routine study. A      transabdominal fetal study was performed. The study included complete 2D      imaging, complete spectral Doppler, and color Doppler. Maternal age: 21 years.      Expected delivery date: 29-Aug-2011. Pregnancy type: single fetus. The mother      was G 4, P 2. Gestational age: 84 weeks. The fetus is in vertex presentation.      Image quality was good.            MEASUREMENT TABLES            2D MEASUREMENTS      General      Femur length   46 mm      Biparietal diam   63.3 mm      Femur/biparietal diam   0.73      Tricuspid valve      Annulus ML diam   7.9 mm      Mitral valve      ML annulus dias   7.56 mm      Pulmonic valve      Annulus sys   6.1 mm      Aortic valve      Annulus sys   5.1 mm            M-MODE MEASUREMENTS      Ventricular septum      IVS ed   4.2 mm            DOPPLER MEASUREMENTS      General      Umbilical artery peak vel   37.1 cm/s      Umbilical artery vel ed   11.5 cm/s      Umbilical artery S/D ratio   3.23      Systemic veins      Ductus venosus peak S vel   34.6 cm/s      Ductus venosus peak D vel   33.9 cm/s      Ductus venosus S/D ratio   1.02      Tricuspid valve      Peak E vel   38 cm/s      Mitral valve      Peak E vel   34.6 cm/s      Pulmonic valve      Peak vel   52 cm/s      Aortic valve      Peak vel   57.4 cm/s      Ductus arteriosus      Peak  antegrade vel   54 cm/s            FETAL RHYTHM: The fetal rhythm was sinus rhythm. The ventricular rate was 131      bpm.            FETAL MORPHOLOGY: There  was normal fetal heart anatomy and hemodynamics.            ANATOMIC RELATIONSHIPS: Visceral situs: normal. Levocardia. Atrial situs:      solitus. Concordant atrioventricular alignment. Ventricular d-loop. Normal      infundibular anatomy. Concordant ventriculoarterial connection. Normally      related great vessels. Left sided aortic arch.            SYSTEMIC VEINS: There was normal systemic venous return. Ductus venosus: Flow      in the ductus venosus was normal.            PULMONARY VEINS: There was normal pulmonary venous return.            RIGHT ATRIUM: Size was normal.            LEFT ATRIUM: Size was normal.            ATRIAL SEPTUM: The foramen ovale flow velocity was normal.            TRICUSPID VALVE: Doppler: The transtricuspid velocity was within the normal      range.            MITRAL VALVE: Doppler: The transmitral velocity was within the normal range.            RIGHT VENTRICLE: The cavity size was normal.            LEFT VENTRICLE: The cavity size was normal.            VENTRICULAR SEPTUM: Thickness was normal. The septum was intact.            PULMONIC VALVE: Doppler: The transpulmonic velocity was within the normal      range.            AORTIC VALVE: Doppler: Transaortic velocity was within the normal range.            PULMONARY ARTERY: The main pulmonary artery was normal in morphology. There is      no anomaly of the pulmonary artery branch origins.            AORTA: The arch is left sided with normal brachiocephalic branching.            SYSTEMIC ARTERIES: No umbilical artery flow reversal was detected.            EXTRACARDIAC SHUNTING: Ductus arteriosus: The antegrade flow velocity in the      ductal arch was normal. A normal ductus arteriosus was appreciated.            PERICARDIUM: There was no pericardial effusion.            IMPRESSIONS:      The fetal echocardiogram is within normal limits, however small atrial and      ventricular septal defects, and persistent ductus arteriosus  cannot be excluded      as postnatal findings.      Prepared and signed by            Annamaria Helling, MD      Signed 20-May-2011 19:33:03                  (N:  [id number])

## 2012-10-10 ENCOUNTER — Emergency Department: Payer: Self-pay

## 2012-10-10 ENCOUNTER — Emergency Department
Admission: EM | Admit: 2012-10-10 | Discharge: 2012-10-10 | Disposition: A | Discharge status: Home / Self Care | Payer: Self-pay | Attending: Emergency Medicine | Admitting: Emergency Medicine

## 2012-10-10 DIAGNOSIS — T169XXA Foreign body in ear, unspecified ear, initial encounter: Secondary | ICD-10-CM | POA: Insufficient documentation

## 2012-10-10 DIAGNOSIS — X58XXXA Exposure to other specified factors, initial encounter: Secondary | ICD-10-CM | POA: Insufficient documentation

## 2012-10-10 DIAGNOSIS — E119 Type 2 diabetes mellitus without complications: Secondary | ICD-10-CM | POA: Insufficient documentation

## 2012-10-10 DIAGNOSIS — IMO0002 Reserved for concepts with insufficient information to code with codable children: Secondary | ICD-10-CM | POA: Insufficient documentation

## 2012-10-10 LAB — CBC AND DIFFERENTIAL
Basophils Absolute Automated: 0.05 10*3/uL (ref 0.00–0.20)
Basophils Automated: 0 % (ref 0–2)
Eosinophils Absolute Automated: 0.27 10*3/uL (ref 0.00–0.70)
Eosinophils Automated: 3 % (ref 0–5)
Hematocrit: 41.6 % (ref 37.0–47.0)
Hgb: 14.3 g/dL (ref 12.0–16.0)
Immature Granulocytes Absolute: 0.02 10*3/uL
Immature Granulocytes: 0 % (ref 0–1)
Lymphocytes Absolute Automated: 4 10*3/uL (ref 0.50–4.40)
Lymphocytes Automated: 40 % (ref 15–41)
MCH: 28 pg (ref 28.0–32.0)
MCHC: 34.4 g/dL (ref 32.0–36.0)
MCV: 81.4 fL (ref 80.0–100.0)
MPV: 9.6 fL (ref 9.4–12.3)
Monocytes Absolute Automated: 0.58 10*3/uL (ref 0.00–1.20)
Monocytes: 6 % (ref 0–11)
Neutrophils Absolute: 5.07 10*3/uL (ref 1.80–8.10)
Neutrophils: 51 % — ABNORMAL LOW (ref 52–75)
Nucleated RBC: 0 /100 WBC (ref 0–1)
Platelets: 474 10*3/uL — ABNORMAL HIGH (ref 140–400)
RBC: 5.11 10*6/uL (ref 4.20–5.40)
RDW: 12 % (ref 12–15)
WBC: 9.99 10*3/uL (ref 3.50–10.80)

## 2012-10-10 LAB — COMPREHENSIVE METABOLIC PANEL
ALT: 22 U/L (ref 0–55)
AST (SGOT): 19 U/L (ref 5–34)
Albumin/Globulin Ratio: 0.9 (ref 0.9–2.2)
Albumin: 3.9 g/dL (ref 3.5–5.0)
Alkaline Phosphatase: 275 U/L — ABNORMAL HIGH (ref 40–150)
Anion Gap: 13 (ref 5.0–15.0)
BUN: 16 mg/dL (ref 7.0–19.0)
Bilirubin, Total: 0.2 mg/dL (ref 0.2–1.2)
CO2: 25 mEq/L (ref 22–29)
Calcium: 10.4 mg/dL (ref 8.5–10.5)
Chloride: 90 mEq/L — ABNORMAL LOW (ref 98–107)
Creatinine: 0.8 mg/dL (ref 0.6–1.0)
Globulin: 4.3 g/dL — ABNORMAL HIGH (ref 2.0–3.6)
Glucose: 567 mg/dL (ref 70–100)
Potassium: 4.4 mEq/L (ref 3.5–5.1)
Protein, Total: 8.2 g/dL (ref 6.0–8.3)
Sodium: 128 mEq/L — ABNORMAL LOW (ref 136–145)

## 2012-10-10 LAB — URINALYSIS, REFLEX TO MICROSCOPIC EXAM IF INDICATED
Bilirubin, UA: NEGATIVE
Blood, UA: NEGATIVE
Glucose, UA: 500 — AB
Ketones UA: NEGATIVE
Leukocyte Esterase, UA: NEGATIVE
Nitrite, UA: NEGATIVE
Protein, UR: NEGATIVE
Specific Gravity UA: 1.029 (ref 1.001–1.035)
Urine pH: 7 (ref 5.0–8.0)
Urobilinogen, UA: NEGATIVE mg/dL

## 2012-10-10 LAB — POCT GLUCOSE: Whole Blood Glucose POCT: 383 mg/dL — AB (ref 70–100)

## 2012-10-10 LAB — GFR: EGFR: >60

## 2012-10-10 LAB — BLOOD GAS, VENOUS
Base Excess, Ven: 4.2 mEq/L
HCO3, Ven: 29.4 mEq/L
O2 Sat, Venous: 73.7 %
Temperature: 37
Venous Total CO2: 26.1 mEq/L
pCO2, Venous: 48.3 mmHg
pH, Ven: 7.402
pO2, Venous: 40.7 mmHg

## 2012-10-10 LAB — HEMOLYSIS INDEX: Hemolysis Index: 16 Index (ref 0–18)

## 2012-10-10 MED ORDER — INSULIN NPH (HUMAN) (ISOPHANE) 100 UNIT/ML SC SUSP
16.00 [IU] | Freq: Every morning | SUBCUTANEOUS | Status: AC
Start: 2012-10-10 — End: 2013-10-10

## 2012-10-10 MED ORDER — LIDOCAINE VISCOUS 2 % MT SOLN
5.00 mL | Freq: Once | OROMUCOSAL | Status: AC
Start: 2012-10-10 — End: 2012-10-10
  Administered 2012-10-10: 5 mL via OROMUCOSAL
  Filled 2012-10-10: qty 15

## 2012-10-10 MED ORDER — INSULIN REGULAR HUMAN 100 UNIT/ML IJ SOLN
6.00 [IU] | Freq: Once | INTRAMUSCULAR | Status: AC
Start: 2012-10-10 — End: 2012-10-10
  Administered 2012-10-10: 6 [IU] via INTRAVENOUS
  Filled 2012-10-10: qty 60

## 2012-10-10 MED ORDER — OXYCODONE-ACETAMINOPHEN 5-325 MG PO TABS
ORAL_TABLET | ORAL | Status: AC
Start: 2012-10-10 — End: 2012-10-20

## 2012-10-10 MED ORDER — OXYCODONE-ACETAMINOPHEN 5-325 MG PO TABS
1.00 | ORAL_TABLET | Freq: Once | ORAL | Status: AC
Start: 2012-10-10 — End: 2012-10-10
  Administered 2012-10-10: 1 via ORAL
  Filled 2012-10-10: qty 1

## 2012-10-10 MED ORDER — SODIUM CHLORIDE 0.9 % IV BOLUS
1000.00 mL | Freq: Once | INTRAVENOUS | Status: AC
Start: 2012-10-10 — End: 2012-10-10
  Administered 2012-10-10: 1000 mL via INTRAVENOUS

## 2012-10-10 NOTE — ED Provider Notes (Signed)
EMERGENCY DEPARTMENT HISTORY AND PHYSICAL EXAM     Physician/Midlevel provider first contact with patient: 10/10/12 0106         Date: 10/10/2012  Patient Name: Kelsey Miles    History of Presenting Illness     Chief Complaint   Patient presents with   . Foreign Body in Ear       History Provided By: Pt    Chief Complaint: FOB in the ear  Onset: last night  Timing:   Location: L ear  Quality:   Severity:   Modifying Factors: pt says a bug crawled in her left ear while she was sleeping  Associated Symptoms: L ear pain, malaise, myalgias    Additional History: Kelsey Miles is a 31 y.o. female c/o a bug crawling into her left ear last night while she was sleeping. Pt reports left ear pain. Hx of diabetes. Pt states she has malaise and myalgias.    PCP: No primary provider on file.      Current Facility-Administered Medications   Medication Dose Route Frequency Provider Last Rate Last Dose   . [COMPLETED] lidocaine (XYLOCAINE) 2 % viscous mouth solution 5 mL  5 mL Mouth/Throat Once Cherlyn Roberts, MD   5 mL at 10/10/12 0130   . oxyCODONE-acetaminophen (PERCOCET) 5-325 MG per tablet 1 tablet  1 tablet Oral Once Cherlyn Roberts, MD         Current Outpatient Prescriptions   Medication Sig Dispense Refill   . cephalexin (KEFELX) 500 MG capsule Take 1 capsule (500 mg total) by mouth 2 (two) times daily.  40 capsule  0   . gabapentin (NEURONTIN) 300 MG capsule Take 300 mg by mouth 3 (three) times daily.         . insulin NPH (HUMULIN N) 100 UNIT/ML injection Inject 2 Units into the skin daily with dinner. 16 units subq qAM, 8 units at bedtime  10 mL  12   . insulin NPH (HUMULIN,NOVOLIN) 100 UNIT/ML injection Inject 2 Units into the skin daily with dinner.         . insulin regular (HUMULIN R) 100 UNIT/ML injection 9 units subq with breakfast and 6 units with dinner  10 mL  12   . Insulin Syringe-Needle U-100 (INSULIN SYRINGE 1CC/29GX1") 29G X 1" 1 ML MISC Use as instructed.  100 each  2   .  oxyCODONE-acetaminophen (PERCOCET) 5-325 MG per tablet 1-2 tablets by mouth every 4-6 hours as needed for pain;  Do not drive or operate machinery while taking this medicine  20 tablet  0   . Prenat w/o A-FE-DSS-Methfol-FA (PRENATAL MULTIVITAMIN) 90-600-400 MG-MCG-MCG tablet Take 1 tablet by mouth daily.         . promethazine (PHENERGAN) 25 MG tablet Take 25 mg by mouth every 6 (six) hours as needed.             Past History     Past Medical History:  Past Medical History   Diagnosis Date   . Diabetes mellitus associated with hormonal etiology 2007       Past Surgical History:  Past Surgical History   Procedure Date   . Cesarean section        Family History:  Family History   Problem Relation Age of Onset   . Diabetes Father    . Diabetes Brother    . Diabetes Sister        Social History:  History   Substance Use  Topics   . Smoking status: Never Smoker    . Smokeless tobacco: Not on file   . Alcohol Use: No       Allergies:  No Known Allergies    Review of Systems     Review of Systems   Constitutional: Positive for malaise/fatigue. Negative for fever and chills.   HENT: Positive for ear pain (left ear). Negative for sore throat.         Bug in left ear   Eyes: Negative for pain.   Respiratory: Negative for shortness of breath.    Cardiovascular: Negative for chest pain.   Gastrointestinal: Negative for nausea, vomiting and diarrhea.   Genitourinary: Negative for dysuria and hematuria.   Musculoskeletal: Positive for myalgias.   Skin: Negative for itching and rash.   Neurological: Negative for speech change, focal weakness and headaches.   Endo/Heme/Allergies: Does not bruise/bleed easily.        NKDA   Psychiatric/Behavioral:        No tobacco use         Physical Exam   BP 153/96  Pulse 98  Temp 96.3 F (35.7 C)  Resp 16  SpO2 97%  LMP 11/15/2010    Physical Exam   Constitutional: Oriented to person, place, and time and well-developed, well-nourished, and in no distress.   Head: Normocephalic and  atraumatic.   Mouth/Throat: Oropharynx is clear and moist.   Ears: Bug seen in the left ear.  Eyes: Conjunctivae normal and EOM are normal. Pupils are equal, round, and reactive to light.    Neck: Normal range of motion. Neck supple. No thyromegaly present.   Cardiovascular: Normal rate, regular rhythm, normal heart sounds and intact distal pulses.  No murmur heard.  Pulmonary/Chest: Effort normal and breath sounds normal.   Abdominal: Soft. Non distended. Non tender. No rebound or guarding  Musculoskeletal: No peripheral edema. No calf swelling or tenderness.    Neurological: Patient is alert and oriented to person, place, and time. No cranial nerve deficit. Gait normal. GCS score is 15.   Skin: Skin is warm and dry. No rash  Psychiatric: Affect normal.       Diagnostic Study Results     Labs -     Results     ** No Results found for the last 24 hours. **          Radiologic Studies -   Radiology Results (24 Hour)     ** No Results found for the last 24 hours. **      .      Medical Decision Making   I am the first provider for this patient.    I reviewed the vital signs, available nursing notes, past medical history, past surgical history, family history and social history.    Vital Signs-Reviewed the patient's vital signs.     Patient Vitals for the past 12 hrs:   BP Temp Pulse Resp   10/10/12 0049 153/96 mmHg 96.3 F (35.7 C) 98  16        Pulse Oximetry Analysis - Normal 97% on RA    Old Medical Records: Nursing notes.     ED Course: 1:30 AM - Bug removed from the left ear. Discussed follow up options with the pt. Pt agreeable with plan.    Provider Notes: 31 y/o woman with insect in ear, which was sucessfully removed. TM visualized after procedure. Small amount of bleeding to external auditory canal.   Pt given follow  up with ED. Prior to discharge patient complaining of general malaise. She is diabetic and non compliant with medication. No fevers or chills. No abd pain or nausea or vomiting. BS in ED >500.  CBC, CMP, VBG, UA and NS bolus ordered. Pt signd out to [employer]    Procedures: Foreign Body  Date/Time: 10/10/2012 1:30 AM  Performed by: Cherlyn Roberts  Authorized by: Cherlyn Roberts  Consent: Verbal consent obtained.  Consent given by: patient  Body area: ear  Location details: left ear  Patient sedated: no  Patient restrained: no  Patient cooperative: yes  Post-procedure assessment: foreign body removed  Patient tolerance: Patient tolerated the procedure well with no immediate complications.    TM visualized after procedure. Small amount of bleeding to external auditory canal.           Core Measures:    Critical Care Time:     Diagnosis     Clinical Impression:   1. Foreign body in ear        _______________________________    Attestations:  This note is prepared by Zack Seal, acting as Scribe for Audley Hose, MD.    Audley Hose, MD. The scribe's documentation has been prepared under my direction and personally reviewed by me in its entirety.  I confirm that the note above accurately reflects all work, treatment, procedures, and medical decision making performed by me.          _______________________________          Cherlyn Roberts, MD  10/11/12 [phone]

## 2012-10-10 NOTE — ED Provider Notes (Signed)
Pt signed out to me.  Pt's BS is improving.  Pt has been off of her diabetes meds for about a year.  Will re-write some of her insulin rx.    Jethro Bastos, MD  10/10/12 [phone]

## 2012-10-10 NOTE — ED Notes (Signed)
Pt arrived to ER with a cockroach in left ear. Pt states cockroach crawled in while pt sleeping. Pt alert and in NAD

## 2012-10-10 NOTE — ED Notes (Signed)
Accucheck reading Hi. MD aware

## 2012-10-12 NOTE — Procedures (Unsigned)
PATIENT NAME:  Kelsey Miles, TEGELER           MED REC NO:      [id number]      ORDERING MD:                                 Francia Greaves DATE:       12/05/2003      DICTATING MD:  Coy Saunas, MD             ACCOUNT NO:     [id number]      PATIENT DOB:     1981/06/21                  PATIENT LOC:     [facility]                  EXAMINATION:  M-MODE AND 2-D ECHOCARDIOGRAPHIC STUDY WITH CARDIAC DOPPLER      FLOW ANALYSIS.            CLINICAL INDICATION:  Heart murmur.            INTERPRETATION:  This is a technically difficult study because of the      patient's body habitus. Dimensions and Contractility:  Right ventricular      internal diameter 1.6 cm. Aortic root diameter 2.6 cm.  Left atrial size      3.3 cm.  Left ventricular internal diameter in diastole 4.9 cm.  Left      ventricular internal diameter in systole 3.1 cm. Interventricular septal      thickness 1.0 cm.  Left ventricular posterior wall thickness 1.0 cm.  Left      ventricular shortening fraction 35%.  Left ventricular ejection fraction      estimated at 65%.            Normal left ventricular size, wall thickness and contractility are observed      with no significant segmental contraction abnormalities seen.  Left atrial      size, aortic root diameter and right ventricular size and contractility are      normal.            Valvular Analysis: A mobile trileaflet aortic valve is seen without      stenosis or insufficiency.  No mitral insufficiency is noted.  Flow across      the mitral valve in diastole is unremarkable.  No tricuspid insufficiency      noted.            Other Findings:  No intracardiac masses, thrombi or vegetations seen.  No      pericardial effusion seen.            FINAL IMPRESSION/ASSESSMENT:  No diagnostic abnormalities found on M-mode      or 2-D echocardiographic study or on cardiac Doppler flow analysis.  No      previous studies available for comparison.                  __________________________________      Coy Saunas, MD             [id number]      D: 12/05/2003  3:27 P      T: 12/08/2003  1:34 P      J: [id number]      N: [id number]      CC:   Coy Saunas, MD  [facility]

## 2012-12-23 ENCOUNTER — Emergency Department: Payer: Charity

## 2012-12-23 ENCOUNTER — Emergency Department
Admission: EM | Admit: 2012-12-23 | Discharge: 2012-12-24 | Disposition: A | Discharge status: Home / Self Care | Payer: Charity | Attending: Emergency Medicine | Admitting: Emergency Medicine

## 2012-12-23 DIAGNOSIS — S01309A Unspecified open wound of unspecified ear, initial encounter: Secondary | ICD-10-CM | POA: Insufficient documentation

## 2012-12-23 DIAGNOSIS — Z794 Long term (current) use of insulin: Secondary | ICD-10-CM | POA: Insufficient documentation

## 2012-12-23 DIAGNOSIS — IMO0002 Reserved for concepts with insufficient information to code with codable children: Secondary | ICD-10-CM | POA: Insufficient documentation

## 2012-12-23 DIAGNOSIS — X58XXXA Exposure to other specified factors, initial encounter: Secondary | ICD-10-CM | POA: Insufficient documentation

## 2012-12-23 HISTORY — DX: Type 2 diabetes mellitus without complications: E11.9

## 2012-12-23 LAB — LIPASE: Lipase: 47 U/L (ref 8–78)

## 2012-12-23 LAB — BASIC METABOLIC PANEL
BUN: 12 mg/dL (ref 7.0–19.0)
CO2: 22 mEq/L (ref 22–29)
Calcium: 9.5 mg/dL (ref 8.5–10.5)
Chloride: 98 mEq/L (ref 98–107)
Creatinine: 1 mg/dL (ref 0.6–1.0)
Glucose: 583 mg/dL (ref 70–100)
Potassium: 4.1 mEq/L (ref 3.5–5.1)
Sodium: 130 mEq/L — ABNORMAL LOW (ref 136–145)

## 2012-12-23 LAB — CBC AND DIFFERENTIAL
Basophils Absolute Automated: 0.04 10*3/uL (ref 0.00–0.20)
Basophils Automated: 0 %
Eosinophils Absolute Automated: 0.28 10*3/uL (ref 0.00–0.70)
Eosinophils Automated: 3 %
Hematocrit: 36 % — ABNORMAL LOW (ref 37.0–47.0)
Hgb: 12.6 g/dL (ref 12.0–16.0)
Immature Granulocytes Absolute: 0.02 10*3/uL
Immature Granulocytes: 0 %
Lymphocytes Absolute Automated: 2.87 10*3/uL (ref 0.50–4.40)
Lymphocytes Automated: 28 %
MCH: 28.1 pg (ref 28.0–32.0)
MCHC: 35 g/dL (ref 32.0–36.0)
MCV: 80.4 fL (ref 80.0–100.0)
MPV: 9.5 fL (ref 9.4–12.3)
Monocytes Absolute Automated: 0.66 10*3/uL (ref 0.00–1.20)
Monocytes: 6 %
Neutrophils Absolute: 6.22 10*3/uL (ref 1.80–8.10)
Neutrophils: 62 %
Nucleated RBC: 0 /100 WBC (ref 0–1)
Platelets: 427 10*3/uL — ABNORMAL HIGH (ref 140–400)
RBC: 4.48 10*6/uL (ref 4.20–5.40)
RDW: 13 % (ref 12–15)
WBC: 10.09 10*3/uL (ref 3.50–10.80)

## 2012-12-23 LAB — HEPATIC FUNCTION PANEL
ALT: 17 U/L (ref 0–55)
AST (SGOT): 16 U/L (ref 5–34)
Albumin/Globulin Ratio: 0.9 (ref 0.9–2.2)
Albumin: 3.6 g/dL (ref 3.5–5.0)
Alkaline Phosphatase: 257 U/L — ABNORMAL HIGH (ref 40–150)
Bilirubin Direct: 0.1 mg/dL (ref 0.0–0.5)
Bilirubin Indirect: 0.3 mg/dL (ref 0.0–1.1)
Bilirubin, Total: 0.4 mg/dL (ref 0.2–1.2)
Globulin: 4 g/dL — ABNORMAL HIGH (ref 2.0–3.6)
Protein, Total: 7.6 g/dL (ref 6.0–8.3)

## 2012-12-23 LAB — URINALYSIS WITH MICROSCOPIC
Bilirubin, UA: NEGATIVE
Blood, UA: NEGATIVE
Glucose, UA: >1000 — AB
Ketones UA: NEGATIVE
Leukocyte Esterase, UA: NEGATIVE
Nitrite, UA: NEGATIVE
Protein, UR: NEGATIVE
Specific Gravity UA: 1.026 (ref 1.001–1.035)
Urine pH: 6.5 (ref 5.0–8.0)
Urobilinogen, UA: NORMAL mg/dL

## 2012-12-23 LAB — POCT PREGNANCY TEST, URINE HCG: POCT Pregnancy HCG Test, UR: NEGATIVE

## 2012-12-23 LAB — ACETONE: Acetone Blood: NEGATIVE

## 2012-12-23 LAB — GFR: EGFR: >60

## 2012-12-23 MED ORDER — SODIUM CHLORIDE 0.9 % IV BOLUS
1000.0000 mL | Freq: Once | INTRAVENOUS | Status: AC
Start: 2012-12-23 — End: 2012-12-23
  Administered 2012-12-23: 1000 mL via INTRAVENOUS

## 2012-12-23 MED ORDER — INSULIN REGULAR HUMAN 100 UNIT/ML IJ SOLN
5.0000 [IU] | Freq: Once | INTRAMUSCULAR | Status: AC
Start: 2012-12-23 — End: 2012-12-23
  Administered 2012-12-23: 5 [IU] via SUBCUTANEOUS
  Filled 2012-12-23: qty 50

## 2012-12-23 NOTE — ED Notes (Signed)
Dr Gerarda Fraction at bedside, with Spanish translator

## 2012-12-23 NOTE — ED Notes (Signed)
I began evaluation as triage physician 9:09 PM   [address], [place]  12/23/12 2109

## 2012-12-23 NOTE — ED Notes (Signed)
Via Spanish interpreter,  [phone]-- pain in left ear states seen at [facility] ED in Dec ,roach removed , noted some blood in ear after removal- pt also states low abdominal  Pain x 3 months intermittent, sometimes hard to walk with pain--+painful with urination-Went to dentist this am to have 2 teeth removed--Pt also states she is a DM

## 2012-12-23 NOTE — ED Notes (Signed)
Call received from the lab, Glucose [phone]. Dr Gerarda Fraction notified

## 2012-12-23 NOTE — ED Provider Notes (Signed)
Physician/Midlevel provider first contact with patient: 12/23/12 2128         History     Chief Complaint   Patient presents with   . Otalgia   . Abdominal Pain     Kelsey Miles Kelsey Miles is a 32 y.o. female with PMHx of DM p/w intermittent lower abd pain onset 3 months pta. Sts if she sits for more than 20 minutes or when she gets up to walk, the pain is worse and "too strong." Assoc dysuria. Denies diarrhea, fever. LMP one month exactly pta. Also complaints of LT ear pain d/t cockroach going in LT ear on 10/09/12. Pt went to [facility] and had it removed, but has had pain and intermittent bleeding from ear. Also sts that 9 months ago, told to stop taking meds for DM because "told it was under control", has not been back to the clinic since and has not taken insulin. Patient denies any other complaints. History obtained from patient.     PCP: Christa See, MD      Past Medical History   Diagnosis Date   . Diabetes        Past Surgical History   Procedure Date   . Cesarean section x2     Social  History   Substance Use Topics   . Smoking status: Never Smoker    . Smokeless tobacco: Not on file   . Alcohol Use: No       .     No Known Allergies    Current/Home Medications    No medications on file        Review of Systems   Constitutional: Negative for fever.   HENT: Positive for ear pain.    Gastrointestinal: Positive for abdominal pain. Negative for diarrhea.   Genitourinary: Positive for dysuria.   All other systems reviewed and are negative.        Physical Exam    BP 133/81  Pulse 115  Temp 99.5 F (37.5 C) (Oral)  Resp 16  Ht 1.626 m  Wt 54.432 kg  BMI 20.59 kg/m2  SpO2 100%  LMP 11/25/2012    Physical Exam   Nursing note and vitals reviewed.  Constitutional: She is oriented to person, place, and time. She appears well-developed and well-nourished.   HENT:   Head: Normocephalic.   Mouth/Throat: Oropharynx is clear and moist.        Less than 0.25 cm cut to LT ear canal   Eyes: Conjunctivae  normal and EOM are normal. No scleral icterus.   Neck: Normal range of motion. Neck supple.   Cardiovascular: Normal rate, regular rhythm and normal heart sounds.    Pulmonary/Chest: Effort normal and breath sounds normal. No respiratory distress.        Lungs clear   Abdominal: Soft. Bowel sounds are normal. She exhibits no distension. There is no tenderness. There is no guarding.   Musculoskeletal: Normal range of motion. She exhibits no tenderness.   Neurological: She is alert and oriented to person, place, and time.   Skin: Skin is warm and dry. No rash noted.   Psychiatric: She has a normal mood and affect. Her behavior is normal.       MDM and ED Course     ED Medication Orders     None           MDM    Differential diagnosis: DKA vs hyperglycemia vs UTI vs otitis media vs otitis externa  11:42 PM  Social work to see about IMG follow up for re-eval DM and insulin management.      Procedures    Clinical Impression & Disposition     Clinical Impression  Final diagnoses:   Uncontrolled insulin dependent diabetes mellitus   Wound, open, ear, canal, left, initial encounter        ED Disposition     None           New Prescriptions    No medications on file        Treatment Team: Scribe: Vanita Ingles  I was acting as a Neurosurgeon for Alcide Evener, MD on Russ Halo M  Treatment Team: Scribe: Vanita Ingles   I am the first provider for this patient and I personally performed the services documented. Treatment Team: Scribe: Vanita Ingles is scribing for me on TOTO AMBROS,Neda M. This note accurately reflects work and decisions made by me.  Alcide Evener, MD      Alcide Evener, MD  12/27/12 [phone]

## 2012-12-23 NOTE — Discharge Instructions (Signed)
Your doctor today was Alcide Evener, MD.    Follow up with the [facility] for further outpatient evaluation of your diabetes.      [facility] Referral     [facility] Referral     1.  Referencia para el grupo de mdicos [facility]    Usted ha sido referido al grupo de [employer] [facility] para su atencin de seguimiento. Por favor pngase en contacto con ellos para pedir asistencia al [phone] o [phone].  Tambin los puede [employer] en:  [email or web address]   1.  [facility] Referral     You have been referred to the [facility] for your follow-up care. Please contact them for assistance at [phone] or [phone].  You can also find them online at:  [email or web address]         Return to the emergency department for any new or worsening symptoms.      La diabetes y las enfermedades cardacas  Si usted tiene diabetes, su probabilidad de Armed forces training and education officer del corazn es [employer] y cuatro veces mayor que la de las personas que no tienen esta afeccin. Esto se debe a que la [employer] Resources con diabetes tambin tienen los principales factores de riesgo de enfermedad cardaca. Lo bueno es que usted puede ayudar a Chief Operating Officer sus riesgos de salud haciendo algunas modificaciones en su vida.     Su equipo de atencin Teacher, English as a foreign language de tratamiento ms adecuado para su caso.   Sus principales factores de riesgo  Lostres factores de riesgo principales para las enfermedades cardcas son el nivel alto de azcar en la sangre, la alta presin arterial y los altos niveles de lpidos. Al Amgen Inc factores de riesgo bajo control, usted puede ayudar a Occupational psychologist sanos su corazn y sus arterias. Esto podra reducir las posibilidades de un ataque al corazn.   Niivel de Banker. Un nivel alto de azcar en la sangre puede endurecer las paredes de las arterias. Luego, la placa (un  material parecido a la cera) se Engineer, manufacturing, dificultando as el paso de la sangre a travs de las arterias. Adems, un nivel alto de azcar en la sangre aumenta la posibilidad de que se eleven la presin arterial y el nivel de lpidos.   Presin arterial. Cuando la presin arterial se mantiene alta todo el tiempo, se daan las paredes de las arterias y es ms probable que la placa se acumule.   Lpidos. El organismo necesita algunos lpidos en la sangre para mantenerse sano. Pero los niveles de lpidos que sean muy altos pueden daar las paredes de las arterias. Los lpidos incluyen al colesterol y los triglicridos. Hay tres clases de colesterol. El colesterol LDL ("malo") puede daar las arterias,pero el colesterol HDL ("bueno") ayuda a eliminar de la sangre al colesterol LDL. Esto ayuda a [employer] Corporation arterias sanas. Cuando el nivel de azcar en la sangre es alto, el nivel de triglicridos en la sangre tambin puede ser alto. Los [employer] Power de triglicridos pueden hacer que se Sports coach.  Estos factores de Armed forces logistics/support/administrative officer acumulacin de placa (un material graso) en las arterias, la cual limita la circulacin de sangre al corazn y puede llegar a ocasionar un infarto.  Otros factores de riesgo  Ciertos factores relacionados con el estilo de vida pueden elevar el nivel de azcaren la sangre, la presin arterial y los lpidos. Estos aumentos Colgate-Palmolive  riesgos de una enfermedad cardca.   Fumardaa el revestimiento de las arterias, lo cual facilita la acumulacin de placa en las paredes arteriales. Adems, fumar causa la constriccin (estrechamiento) de las arterias, lo que puede elevar la presin arterial.   La falta de actividad fsicahace que el corazn tenga que hacer un mayor esfuerzo para desempear su funcin. La inactividad est vinculada a muchos otros factores de riesgo, tales como la presin arterial alta y niveles inadecuados de colesterol.   El sobrepesohace  que al cuerpo le sea ms difcil usar la insulina y que el corazn tenga que esforzarse demasiado.  Modificaciones que Lear Corporation sus medicamentos segn las indicaciones todos [place], [place] se sienta bien.   Con slo tomar unas medidas sencillas podr mantener controlados sus factores de [place]. Trabaje en colaboracin con su equipo de atencin mdica para lograr sus objetivos.   Deje de fumar,ya que fumar daa el revestimiento de los vasos sanguneos y eleva la presin arterial; adems altera el modo en que el cuerpo [place] la [place], lo cual dificulta el control del azcar sanguneo. Si usted fuma y necesita ayuda para dejar el cigarrillo, hable con su equipo de atencin mdica.   Mdase el azcar sanguneo,ya que es la nica manera de saber si lo tiene controlado. Asegrese de PG&E Corporation pruebas usted mismo. Adems, hgase anlisis de Delphi, segn las indicaciones.   Contrlese la presin arterial y los niveles de lpidospara ayudarle amantener valores ptimos. Acuda a las citas programadas con su equipo de atencin mdica.   Tome medicamentosparaayudar a Psychologist, occupational, la presin arterial y los niveles de [place]. Si le recetan medicamentos, tmelos segn las indicaciones.   Alimntese bienpara reducir sus factores de riesgo y [place] a Curator. Procure limitar la cantidad de carbohidratos que consume en una misma comida. Coma alimentos que contengan poca grasa saturada y colesterol. Consuma mucha fibra, como las verduras y los granos integrales. Reduzca su consumo de sal. Un nutricionista o educador en diabetes pueden ayudarle a elaborar un plan de comidas que sea adecuado para usted.   Haga actividades fsicaspara bajar de peso, fortalecer su corazn y Honeywell niveles de lpidos y la presin arterial. El ejercicio y la Doroteo Glassman fsica son beneficiosos para todo el organismo. Hable con su equipo de atencin Walgreen cmo puede  aumentar su nivel de actividad fsica gradualmente y sin peligro.   Asista a todas sus citascon su proveedor de atencin mdica, para ayudarse a [place]. Acuda a Aflac Incorporated chequeos y Doolittle de laboratorio segn el programa establecido.   [address] [place], [place] [address]. Todos los derechos reservados. Esta informacin no pretende sustituir la atencin Actor. Slo su mdico puede diagnosticar y tratar un problema de salud.      La diabetes y la enfermedad renal  La diabetes disminuye la capacidad del cuerpo para usar los alimentos que come como fuente de Engineer, drilling. A consecuencia de esto, la glucosa (la forma de azcar que el cuerpo [place] como combustible) se acumula en la sangre. Con el tiempo, el exceso de glucosa en la sangre puede daar los vasos sanguneos y los riones. Si usted se controla la diabetes, podr Futures trader correcto de glucosa en la sangre y retardar cualquier dao a los riones.  Acuda a las citas con su proveedor de atencin mdica segn lo previsto.   Cumpla con su dieta  Para obtener la  mayor cantidad de energa de los ConocoPhillips come y sentirse lo mejor posible, quizs usted tenga que seguir una dieta especial. Proofreader con su equipo de atencin mdica para disear el plan de comidas ms adecuado para usted.  Adems, tal vez tenga que:   Comer menos protena.   Beber menos lquido.   Limitar su consumo de sodio (sal).   Comer alimentos con poco fsforo y Government social research officer.  Pngase la insulina y OGE Energy medicamentos para la diabetes segn las indicaciones  La insulina es una hormona que ayuda al cuerpo a usar la glucosa. Es posible que usted tenga que ponerse inyecciones de insulina para aumentar el nivel de esta hormona en el cuerpo, o que tenga que tomar unas pastillas que ayudan al cuerpo a Animator ms insulina o a utilizarla de manera ms eficiente. La cantidad de insulina quesu organismonecesita puede  disminuir dependiendo de la etapaen que se encuentresu enfermedad renal, de manera que sus inyecciones de insulina o sus medicamentos podran ser ajustados; hable con su mdico si a menudo tiene niveles bajos de glucosa en Clear Channel Communications. Monitoree sus niveles de glucosa con un medidor de acuerdo a lo que le indique su mdico.  Haga ejercicios  El ejercicio fsico ayuda al cuerpo a Merchandiser, retail. Para obtener los mejores resultados:   Hable con su mdico antes de iniciar un programa de ejercicios.   Pregunte al mdico con qu frecuencia debe hacer ejercicios y cunto tiempo deben durar las sesiones.   Su mdico podra sugerirle actividades que le ayudarn a sentirse en buena forma.   Coma 1-2 horas antes de hacer ejercicio.   [address], [place], [place] [address]. Todos los derechos reservados. Esta informacin no pretende sustituir la atencin Actor. Slo su mdico puede diagnosticar y tratar un problema de salud.

## 2012-12-24 LAB — POCT GLUCOSE: Whole Blood Glucose POCT: 353 mg/dL — AB (ref 70–100)

## 2012-12-24 MED ORDER — SODIUM CHLORIDE 0.9 % IV BOLUS
1000.0000 mL | Freq: Once | INTRAVENOUS | Status: DC
Start: 2012-12-24 — End: 2012-12-24

## 2012-12-24 NOTE — [facility] Notes (Signed)
Referral for medical resources. 32 yo married female arrived in ED with c/o abdominal pain. Pt reports that she is diabetic and has not taken medication recently. Pt states she is married with 3 children and spouse. Pt states her husband works but she does not work and the children receive medicaid. Educated pt on the risks of untreated diabetes. Pt is aware of [facility] and agreed to follow up with application. In addition, pt agreed to participate in Transitional Care Management program and she provided cell phone number [phone]. This social worker left message on referral line at this time.  No other needs noted at this time.

## 2012-12-29 ENCOUNTER — Emergency Department: Payer: Charity

## 2012-12-29 ENCOUNTER — Emergency Department
Admission: EM | Admit: 2012-12-29 | Discharge: 2012-12-29 | Disposition: A | Discharge status: Home / Self Care | Payer: Charity | Attending: Emergency Medicine | Admitting: Emergency Medicine

## 2012-12-29 ENCOUNTER — Other Ambulatory Visit: Payer: Self-pay

## 2012-12-29 ENCOUNTER — Ambulatory Visit (INDEPENDENT_AMBULATORY_CARE_PROVIDER_SITE_OTHER): Payer: Charity | Admitting: Acute Care

## 2012-12-29 ENCOUNTER — Ambulatory Visit (INDEPENDENT_AMBULATORY_CARE_PROVIDER_SITE_OTHER): Payer: Charity

## 2012-12-29 VITALS — BP 121/80 | HR 109 | Temp 98.2°F | Resp 18 | Ht 61.0 in | Wt 118.0 lb

## 2012-12-29 DIAGNOSIS — E119 Type 2 diabetes mellitus without complications: Secondary | ICD-10-CM | POA: Insufficient documentation

## 2012-12-29 DIAGNOSIS — IMO0001 Reserved for inherently not codable concepts without codable children: Secondary | ICD-10-CM

## 2012-12-29 LAB — CBC AND DIFFERENTIAL
Basophils Absolute Automated: 0.04 10*3/uL (ref 0.00–0.20)
Basophils Automated: 0 %
Eosinophils Absolute Automated: 0.22 10*3/uL (ref 0.00–0.70)
Eosinophils Automated: 2 %
Hematocrit: 39.1 % (ref 37.0–47.0)
Hgb: 13.7 g/dL (ref 12.0–16.0)
Immature Granulocytes Absolute: 0.03 10*3/uL
Immature Granulocytes: 0 %
Lymphocytes Absolute Automated: 2.69 10*3/uL (ref 0.50–4.40)
Lymphocytes Automated: 23 %
MCH: 27.8 pg — ABNORMAL LOW (ref 28.0–32.0)
MCHC: 35 g/dL (ref 32.0–36.0)
MCV: 79.5 fL — ABNORMAL LOW (ref 80.0–100.0)
MPV: 9.4 fL (ref 9.4–12.3)
Monocytes Absolute Automated: 1.27 10*3/uL — ABNORMAL HIGH (ref 0.00–1.20)
Monocytes: 11 %
Neutrophils Absolute: 7.46 10*3/uL (ref 1.80–8.10)
Neutrophils: 64 %
Nucleated RBC: 0 /100 WBC (ref 0–1)
Platelets: 426 10*3/uL — ABNORMAL HIGH (ref 140–400)
RBC: 4.92 10*6/uL (ref 4.20–5.40)
RDW: 13 % (ref 12–15)
WBC: 11.71 10*3/uL — ABNORMAL HIGH (ref 3.50–10.80)

## 2012-12-29 LAB — LIPASE: Lipase: 36 U/L (ref 8–78)

## 2012-12-29 LAB — POCT GLUCOSE
Whole Blood Glucose POCT: 555 mg/dL — AB (ref 70–100)
Whole Blood Glucose POCT: 377 mg/dL — AB (ref 70–100)

## 2012-12-29 LAB — GROUP A STREP, RAPID ANTIGEN: Group A Strep, Rapid Antigen: NEGATIVE

## 2012-12-29 LAB — POCT PREGNANCY TEST, URINE HCG: POCT Pregnancy HCG Test, UR: NEGATIVE

## 2012-12-29 LAB — URINALYSIS WITH MICROSCOPIC
Bilirubin, UA: NEGATIVE
Glucose, UA: >1000 — AB
Ketones UA: NEGATIVE
Nitrite, UA: NEGATIVE
Specific Gravity UA: 1.035 (ref 1.001–1.035)
Urine pH: 6 (ref 5.0–8.0)
Urobilinogen, UA: NORMAL mg/dL

## 2012-12-29 LAB — COMPREHENSIVE METABOLIC PANEL
ALT: 15 U/L (ref 0–55)
AST (SGOT): 16 U/L (ref 5–34)
Albumin/Globulin Ratio: 0.9 (ref 0.9–2.2)
Albumin: 4.1 g/dL (ref 3.5–5.0)
Alkaline Phosphatase: 265 U/L — ABNORMAL HIGH (ref 40–150)
BUN: 10 mg/dL (ref 7.0–19.0)
Bilirubin, Total: 0.8 mg/dL (ref 0.2–1.2)
CO2: 19 mEq/L — ABNORMAL LOW (ref 22–29)
Calcium: 10.1 mg/dL (ref 8.5–10.5)
Chloride: 95 mEq/L — ABNORMAL LOW (ref 98–107)
Creatinine: 0.8 mg/dL (ref 0.6–1.0)
Globulin: 4.5 g/dL — ABNORMAL HIGH (ref 2.0–3.6)
Glucose: 416 mg/dL — ABNORMAL HIGH (ref 70–100)
Potassium: 4.1 mEq/L (ref 3.5–5.1)
Protein, Total: 8.6 g/dL — ABNORMAL HIGH (ref 6.0–8.3)
Sodium: 128 mEq/L — ABNORMAL LOW (ref 136–145)

## 2012-12-29 LAB — BASIC METABOLIC PANEL
BUN: 7 mg/dL (ref 7.0–19.0)
CO2: 23 mEq/L (ref 22–29)
Calcium: 8.1 mg/dL — ABNORMAL LOW (ref 8.5–10.5)
Chloride: 105 mEq/L (ref 98–107)
Creatinine: 0.6 mg/dL (ref 0.6–1.0)
Glucose: 233 mg/dL — ABNORMAL HIGH (ref 70–100)
Potassium: 3.7 mEq/L (ref 3.5–5.1)
Sodium: 135 mEq/L — ABNORMAL LOW (ref 136–145)

## 2012-12-29 LAB — GFR
EGFR: >60
EGFR: >60

## 2012-12-29 LAB — ACETONE: Acetone Blood: NEGATIVE

## 2012-12-29 MED ORDER — METFORMIN HCL 500 MG PO TABS
500.00 mg | ORAL_TABLET | Freq: Once | ORAL | 0 refills | Status: DC
Start: 2012-12-29 — End: 2012-12-29
  Filled 2012-12-29: qty 30, 30d supply, fill #0

## 2012-12-29 MED ORDER — INSULIN REGULAR HUMAN 100 UNIT/ML IJ SOLN
5.0000 [IU] | Freq: Once | INTRAMUSCULAR | Status: AC
Start: 2012-12-29 — End: 2012-12-29
  Administered 2012-12-29: 5 [IU] via INTRAVENOUS
  Filled 2012-12-29: qty 50

## 2012-12-29 MED ORDER — SODIUM CHLORIDE 0.9 % IV BOLUS
1000.0000 mL | Freq: Once | INTRAVENOUS | Status: AC
Start: 2012-12-29 — End: 2012-12-29
  Administered 2012-12-29: 1000 mL via INTRAVENOUS

## 2012-12-29 MED ORDER — SODIUM CHLORIDE 0.9 % IV BOLUS
1000.00 mL | Freq: Once | INTRAVENOUS | Status: AC
Start: 2012-12-29 — End: 2012-12-29
  Administered 2012-12-29: 1000 mL via INTRAVENOUS

## 2012-12-29 NOTE — ED Notes (Signed)
Pt states went to clinic today, states was sent here for high blood sugar. States was 550 or higher, machine stated "high". Pt states fever ongoing since last night with diaphoresis and headache.

## 2012-12-29 NOTE — ED Notes (Signed)
IV removed left hand 20ga catheter tip intact, dressing applied.

## 2012-12-29 NOTE — [facility] Notes (Signed)
Appt conducted with spanish interpreter via language line.  Pt here today for fever and body aches, since yesterday afternoon. Pt afebrile at clinic.  Pt states that she does not feel well.  Pt appears lethargic.    Pt was dx with DM 6 years at [facility]. Pt has been on oral medications & insulin in the past.   Pt does not recall what type of medications or insulin she took previously.  Pt has not been treated for DM for greater than one year.      Tested poct BG at clinic.  Meter read "Hi" (>550 per equipment manual)  Pt states that she has no BG testing supplies at home.  Pt has no insulin or DM oral meds at home.  Pt given cup of water while at clinic.    Spoke briefly with Denton Ar, NP (provider that pt is scheduled to see today).  Agreed that pt should be sent to ED for further evaluation and to receive treatment for hyperglycemia.     Explained to pt that we do not have medications or fluids to treat here at clinic.  Pt states understanding and agreed to go to ED.   Pt states that a friend drove her to clinic appt today and can drive her to ED.      Pt provided with BG testing supplies and asked to check over the weekend.    Called [facility] ED and gave report.  Requested that pt be discharged with DM medication for over the weekend.  Advised that SW will have to evaluate needs.      Called Raynelle Fanning Moscovitz, CM and requested that pt been seen while in ED and be provided with DM meds, if possible for at least the  weekend.  Advised that pt has appt here at clinic Monday PM, so we can pick up from then.

## 2012-12-29 NOTE — ED Provider Notes (Signed)
Physician/Midlevel provider first contact with patient: 12/29/12 1736         History     Chief Complaint   Patient presents with   . Fever   . Hyperglycemia     HPI Comments: 32 y.o. 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 Pt was sent from transitional clinic for DM management. No ab pain n/v HA. Glucose moderately elevated today    Patient is a 32 y.o. female presenting with fever and hyperglycemia. The history is provided by the patient. A language interpreter was used.   Fever  Primary symptoms of the febrile illness include fever, cough, nausea and myalgias. Primary symptoms do not include shortness of breath, abdominal pain, vomiting or diarrhea. The current episode started more than 1 week ago. This is a recurrent problem. The problem has not changed since onset.  The myalgias are associated with weakness.   Hyperglycemia  Associated symptoms include congestion, coughing, a fever, myalgias, nausea and weakness. Pertinent negatives include no abdominal pain, chest pain, chills, neck pain or vomiting.       Past Medical History   Diagnosis Date   . Diabetes        Past Surgical History   Procedure Date   . Cesarean section x2       History reviewed. No pertinent family history.    Social  History   Substance Use Topics   . Smoking status: Never Smoker    . Smokeless tobacco: Not on file   . Alcohol Use: No       .     No Known Allergies    Current/Home Medications    No medications on file        Review of Systems   Constitutional: Positive for fever. Negative for chills.   HENT: Positive for congestion and rhinorrhea. Negative for ear pain and neck pain.    Eyes: Negative for pain and discharge.   Respiratory: Positive for cough. Negative for chest tightness, shortness of breath and stridor.    Cardiovascular: Negative for chest pain, palpitations and leg swelling.   Gastrointestinal: Positive for nausea. Negative for vomiting,  abdominal pain and diarrhea.   Genitourinary: Negative for frequency and flank pain.   Musculoskeletal: Positive for myalgias. Negative for back pain.   Skin: Negative for color change and pallor.   Neurological: Positive for weakness. Negative for dizziness and syncope.   Psychiatric/Behavioral: Negative for behavioral problems and agitation.       Physical Exam    BP 111/70  Pulse 98  Temp 98.4 F (36.9 C) (Oral)  Resp 16  SpO2 100%  LMP 11/25/2012    Physical Exam   Nursing note and vitals reviewed.  Constitutional: She is oriented to person, place, and time. She appears well-developed and well-nourished.   HENT:   Mouth/Throat: Oropharyngeal exudate present.        dry mucus membrane   Cardiovascular: Regular rhythm.         tachy   Pulmonary/Chest: Effort normal and breath sounds normal.   Abdominal: Soft. Bowel sounds are normal.   Musculoskeletal: Normal range of motion.   Neurological: She is alert and oriented to person, place, and time.   Skin: Skin is warm and dry.   Psychiatric: She has a normal mood and affect. Her behavior is normal. Judgment and thought content normal.       MDM and ED Course  ED Medication Orders      Start     Status Ordering Provider    12/29/12 1940   sodium chloride 0.9 % bolus 1,000 mL   Once      Route: Intravenous  Ordered Dose: 1,000 mL         Last MAR action:  Stopped Elesa Hacker R    12/29/12 1821   insulin regular (HumuLIN,NovoLIN) injection 5 Units   Once      Route: Intravenous  Ordered Dose: 5 Units         Last MAR action:  Given Joice Lofts    12/29/12 1719   sodium chloride 0.9 % bolus 1,000 mL   Once      Route: Intravenous  Ordered Dose: 1,000 mL         Last MAR action:  Stopped LEX, Marshia Ly                 MDM      Procedures    Clinical Impression & Disposition     Clinical Impression  Final diagnoses:   Hyperglycemia        ED Disposition     Discharge Alinda Money Ambros discharge to home/self care.    Condition at  discharge: Stable             New Prescriptions    METFORMIN (GLUCOPHAGE) 500 MG TABLET    Take 1 tablet (500 mg total) by mouth once.           After fluid and insulin bs lowered  Pt given metformin and filled by social work  Has appt with clinic on monday   Will d/c  No s/s DKA    Joice Lofts, MD  12/29/12 2318

## 2012-12-29 NOTE — Discharge Instructions (Signed)
Hyperglycemia    During your visit today, your blood sugar was found to be high.    The medical term for high blood sugar is hyperglycemia. This may be a one-time event, but it could mean that you have diabetes. Diabetes is a serious illness and if it is left untreated, it can lead to heart problems, kidney problems (including kidney failure), stroke, or blindness. It is very important that you follow up with your regular doctor for a recheck of your blood sugar.    Tell your regular doctor that your blood sugar was high today. Your doctor will want to recheck your blood and possibly order other lab tests. If your doctor finds that you have diabetes, you will need medication and a special diet to control your blood sugar.    YOU SHOULD SEEK MEDICAL ATTENTION IMMEDIATELY, EITHER HERE OR AT THE NEAREST EMERGENCY DEPARTMENT, IF ANY OF THE FOLLOWING OCCURS:   Confusion or lethargy (very sleepy and hard to wake up).   Signs of dehydration, such as decreased urination, dry mouth, extreme fatigue, lightheadedness, or fainting.   Persistent vomiting.   Fever (temperature of over 100.5 F.) or shaking chills.   Abdominal (belly) pain or vomiting.

## 2012-12-29 NOTE — [facility] Notes (Signed)
12/29/12 Social work received call from [employer] employee Facilities manager) requesting prescription assistance for this patient.  Apparently pt is an uncontrolled diabetic who isn't currently on DM medications and had not been checking her blood sugar at home.  TCM clinic provided her with a glucose monitoring machine and test strips but did not have medication available to dispense so sent her to the ED with an alert call to social work.  Although social work does not normally fill prescriptions that are found on the $4 med list at [employer], social work did fill a 30 day supply for pt because TCM had indicated that pt did not have easy access to transportation.  TCM clinic had only requested help for meds over the weekend but since the price for 3 pills was the same as the cost of 30 pills, social work spoke with EDP about changing amount to reflect the higher quantity.  Prescription filled through the [facility].  Pt to follow up with the TCM clinic on Monday 01/01/13.

## 2013-01-01 ENCOUNTER — Ambulatory Visit (INDEPENDENT_AMBULATORY_CARE_PROVIDER_SITE_OTHER): Payer: Charity

## 2013-01-01 ENCOUNTER — Ambulatory Visit (INDEPENDENT_AMBULATORY_CARE_PROVIDER_SITE_OTHER): Payer: Charity | Admitting: Acute Care

## 2013-01-10 ENCOUNTER — Encounter (INDEPENDENT_AMBULATORY_CARE_PROVIDER_SITE_OTHER): Payer: Self-pay | Admitting: Registered Nurse

## 2013-01-10 ENCOUNTER — Ambulatory Visit (INDEPENDENT_AMBULATORY_CARE_PROVIDER_SITE_OTHER): Payer: Charity | Admitting: Registered Nurse

## 2013-01-10 ENCOUNTER — Ambulatory Visit (INDEPENDENT_AMBULATORY_CARE_PROVIDER_SITE_OTHER): Payer: Charity

## 2013-01-10 VITALS — BP 118/71 | HR 93 | Temp 98.1°F | Resp 16 | Wt 117.9 lb

## 2013-01-10 MED ORDER — LISINOPRIL 5 MG PO TABS
5.0000 mg | ORAL_TABLET | Freq: Every day | ORAL | Status: DC
Start: 2013-01-10 — End: 2018-05-30

## 2013-01-10 MED ORDER — METFORMIN HCL 1000 MG PO TABS
1000.0000 mg | ORAL_TABLET | Freq: Two times a day (BID) | ORAL | Status: DC
Start: 2013-01-10 — End: 2018-05-21

## 2013-01-10 NOTE — [facility] Notes (Signed)
Did not see pt at this appt.  Pt did complete visit with NP.

## 2013-01-10 NOTE — Patient Instructions (Addendum)
1.  Increase Metformin 500mg  2x/day.    2.  Start Lisinopril (Zestril) 5mg  daily.    3.  Take your blood sugars 2x/day - in the morning before breakfast & in the evening   2 hours after dinner.   Write down your numbers in your book & bring your book   To every clinic visit.    4.  Follow a diabetic diet - low sugar,  Low carbohydrate    5.  F/U visit next week (fasting) - will draw fasting lipids at next visit.

## 2013-01-10 NOTE — Progress Notes (Signed)
[facility]  Diabetes History & Physical    Chief Complaint:   Diabetes (Dx 6 years ago)  Hyperlipidemia    HPI:   This patient is a 32 y.o. year old female here for initial visit to ITS.    Patient has a 6 year hx of DM - reports initially took pills, then changed to Insulin.  She stopped taking her medicines about 1 year ago because of no access to care (was a [facility] patient, but had difficulty reapplying, eventually got tired of long reapplication process).    On 12/29/12, presented to ITS clinic for initial evaluation,  BS very high "HI" on meter & patient having sx of lethargy.  She was sent to the ED for evaluation, started on IVF,  Insulin and discharged on Metformin.      Presents today looking well, no complaints.  Afebrile,  VSS.  Only taking Metformin 500mg  daily.  Will increase Metformin to 500mg  bid,  Start Zestril 5mg  daily, have patient take BS 2x/day & reevaluate BS levels next week.  She has a few days left of Metformin, given new script for for 1000mg  Metformin bid - I have asked patient to increase dose to 1000mg  bid when 500mg  bottle is finished.  Labs & urine drawn.  Will have pt. RTC next week.    Has not taken BS readings at home because she does not have a meter & supplies.  Given new meter & supplies.  Informed to take BS 2x/day.  HgbA1C not drawn in hospital - will draw today    Language/Use of interpreter?  Spanish - used phone interpreter during clinic visit    Patient has/is using a glucometer?  Given new glucometer    Glucose log result:  Given log & instructed to record number on log & bring log to every clinic visit.    PMSH:     Past Medical History   Diagnosis Date   . Diabetes        Past Surgical History   Procedure Date   . Cesarean section x2       Allergies:   No Known Allergies    Meds:     Prior to Admission medications    Not on File       FH:   No family history on file.    SH:     History     Social History   . Marital Status: Unknown     Spouse  Name: N/A     Number of Children: N/A   . Years of Education: N/A     Occupational History   . Not on file.     Social History Main Topics   . Smoking status: Never Smoker    . Smokeless tobacco: Not on file   . Alcohol Use: No   . Drug Use:    . Sexually Active: Not on file     Other Topics Concern   . Not on file     Social History Narrative   . No narrative on file       ROS:   All other systems reviewed and negative except as described above.    Physical Exam:   VSS:  T 98.1  P 93   R 16   BP  118/71    General: awake, alert, oriented x 3; no acute distress.  HEENT: perrla, eomi, sclera anicteric,  oropharynx clear without lesions, mucous membranes moist  Neck: supple, no  lymphadenopathy, no thyromegaly, no JVD, no carotid bruits  Cardiovascular: regular rate and rhythm, no murmurs, rubs or gallops  Lungs: clear to auscultation bilaterally, without wheezing, rhonchi, or rales  Abdomen: soft, non-tender, non-distended; no palpable masses, no hepatosplenomegaly, normoactive bowel sounds, no rebound or guarding  Extremities: no clubbing, cyanosis, or edema  Neuro: cranial nerves grossly intact, strength 5/5 in upper and lower extremities, sensation intact  Skin: no rashes or lesions noted    Diagnostics:     Lab Results   Component Value Date    WBC 11.71* 12/29/2012    HGB 13.7 12/29/2012    HCT 39.1 12/29/2012    MCV 79.5* 12/29/2012    PLT 426* 12/29/2012     Lab Results   Component Value Date    CREAT 0.6 12/29/2012    BUN 7.0 12/29/2012    NA 135* 12/29/2012    K 3.7 12/29/2012    CL 105 12/29/2012    CO2 23 12/29/2012     Lab Results   Component Value Date    ALT 15 12/29/2012    AST 16 12/29/2012    ALKPHOS 265* 12/29/2012    BILITOTAL 0.8 12/29/2012     No results found for this basename: HGBA1C     No results found for this basename: CHOL     No results found for this basename: HDL     No results found for this basename: LDL     No results found for this basename: TRIG       No results found for this basename: MICROALB,  MICROALBCREAT       ASSESSMENT:     1. DM:  I don't have enough information to determine DM control.  Will increase Metformin to 500mg    Bid (was only taking daily).  Will draw HgbA1C today.  Will have patient take BS 2x/day &   Record numbers in log.  Patient will RTC next week for reevaluation & will make adjustments   on  His meds at that time.    PLAN:   1.  Increase Metformin 500mg  2x/day.  Finish your bottle.  When bottle is finished, start 1000mg    Bid - given new script for 1000mg  tablets.    2.  Start Lisinopril (Zestril) 5mg  daily.    3.  Take your blood sugars 2x/day - in the morning before breakfast & in the evening   2 hours after dinner.   Write down your numbers in your book & bring your book   To every clinic visit.    4.  Follow a diabetic diet - low sugar,  Low carbohydrate    5.  F/U visit next week (fasting) - will draw fasting lipids at next visit.    6.  Labs drawn today - HgbA1C, urine for microalbumin        DIABETES QUAILTY MEASURES    IS PATIENT AT MINIMUM ON METFORMIN IF TYPE II DIABETIC?  Yes  Should try to even if patient on insulin, contraindications incude Cre >1.5 men, >1.4 women, GI intolerance, severe systolic heart failure, liver failure.    HAS ASSESSMENT FOR DIABETIC NEPHROPATHY BEEN MADE? Yes  Urine spot protein/creatinine ratio>30 or positive urine for microalbumin,  not necessary to do if patient already on ACE or ARB or patient has existing CKD.    IS THE PATIENT ON AN ACE INHIBITOR (OR ARB IF CAN'T TOLERATE)? Yes  Indications are co-morbid CKD, HTN, MI, systolic HF, or proteinuria/microalbinuria.  WAS LIPID THERAPY INITIATED? No  Moderate intensity statins recommended with dx of DM age 54-75, high intensity statins with ASCVD risk > 7.5%    IS BP GOAL <140/80 MET? Yes  First line choice of antihypertensive should be ACEI or ARB.    WAS SMOKING CESSATION ADDRESSED IF THE PATIENT IS A SMOKER? Non-smoker    IS ASA ON BOARD WITH COMORBID CAD OR HIGH CV RISK? No  Would  recommend in patieents with comorbid CAD, HTN, albuminuria, CVD, PVD, or if the patient is a smoker or has a strong family history.    IS THE A1C AT GOAL OF 7? Will draw today  Goal may be higher if elderly or prone to hypoglycemia.    WAS A FOOT EXAM COMPLETED? Yes    HAS THE PATIENT BEEN REFERRED TO PODIATRY? No  Would refer if patient has any symptoms of neuropathy, PVD, past history of foot ulcers or infections.    HAS THE PATIENT BEEN REFERRED TO THE PHARMACIST? No  Would consider referral if patient on insulin and not controlled.    HAS THE PATIENT BEEN REFERRED TO [facility]? Yes    WAS MEDICATION RECONCILIATION PERFORMED? Yes    WAS THE PATIENT'S RECENT ER OR HOSPITAL VISIT DISCUSSED AND APPROPRIATE FOLLOW UP ARRANGED? Yes     FOLLOW-UP PLAN:    PROGRESS ON ESTABLISHING A MEDICAL HOME:

## 2013-01-11 LAB — COMPREHENSIVE METABOLIC PANEL
ALT: 9 IU/L (ref 0–32)
AST (SGOT): 13 IU/L (ref 0–40)
Albumin/Globulin Ratio: 1.2 (ref 1.1–2.5)
Albumin: 4.2 g/dL (ref 3.5–5.5)
Alkaline Phosphatase: 208 IU/L — ABNORMAL HIGH (ref 39–117)
BUN / Creatinine Ratio: 14 (ref 8–20)
BUN: 7 mg/dL (ref 6–20)
Bilirubin, Total: 0.3 mg/dL (ref 0.0–1.2)
CO2: 23 mmol/L (ref 19–28)
Calcium: 9 mg/dL (ref 8.7–10.2)
Chloride: 96 mmol/L — ABNORMAL LOW (ref 97–108)
Creatinine: 0.51 mg/dL — ABNORMAL LOW (ref 0.57–1.00)
EGFR: 129 mL/min/{1.73_m2} (ref >59)
EGFR: 149 mL/min/{1.73_m2} (ref >59)
Globulin, Total: 3.4 g/dL (ref 1.5–4.5)
Glucose: 390 mg/dL — ABNORMAL HIGH (ref 65–99)
Potassium: 4.3 mmol/L (ref 3.5–5.2)
Protein, Total: 7.6 g/dL (ref 6.0–8.5)
Sodium: 134 mmol/L (ref 134–144)

## 2013-01-11 LAB — MICROALBUMIN, RANDOM URINE WITHOUT CREATININE: Microalbumin, UR: 4.1 ug/mL (ref 0.0–17.0)

## 2013-01-11 LAB — HEMOGLOBIN A1C: Hemoglobin A1C: 15 % — ABNORMAL HIGH (ref 4.8–5.6)

## 2013-01-19 ENCOUNTER — Other Ambulatory Visit (INDEPENDENT_AMBULATORY_CARE_PROVIDER_SITE_OTHER): Payer: Self-pay

## 2013-01-19 ENCOUNTER — Ambulatory Visit (INDEPENDENT_AMBULATORY_CARE_PROVIDER_SITE_OTHER): Payer: Charity | Admitting: Registered Nurse

## 2013-01-19 ENCOUNTER — Ambulatory Visit (INDEPENDENT_AMBULATORY_CARE_PROVIDER_SITE_OTHER): Payer: Charity

## 2013-01-19 VITALS — BP 96/65 | HR 96 | Temp 98.0°F | Resp 18

## 2013-01-19 LAB — POCT GLUCOSE: Whole Blood Glucose POCT: 463 mg/dL — AB (ref 70–100)

## 2013-01-19 MED ORDER — INSULIN LISPRO PROT & LISPRO (75-25) 100 UNIT/ML SC SUSP
SUBCUTANEOUS | Status: DC
Start: 2013-01-19 — End: 2018-05-30

## 2013-01-19 NOTE — Patient Instructions (Addendum)
1.  Start Humalog Insulin (Kwik Pen)  -  10 units before breakfast &  10 units before dinner.         You have been given a free voucher of Humalog KwikPem - please go to [facility] & get this free Insulin today.        Start this Insulin today  as soon as you get home.          2.  Continue to take Metformin 1000mg  2x/day.        Make sure you take Metformin with food in your stomach.    3.  Continue to take your blood sugars 2x/day - in the morning before breakfast & in the evening (2 hours after dinner)       Write down your blood sugar readings in your book & always bring your book to every clinic visit.    4.  Walk or exercise  3-5 x/week.    5.  F/U visit to see Pharmacist & Nurse Practitioner next week (Tuesday).    6.  If you have any questions/problems,  Call our clinic [facility]   [phone])

## 2013-01-19 NOTE — Progress Notes (Signed)
Kelsey Miles Kelsey Miles Kelsey Miles is a 32 y.o. female who presents today for insulin pen teaching. Per chart review, patient with PMH of pregnancy induced DM/hyperglycemia, untreated. Within the last week, placed on metformin 1000mg  PO BID. Per Denny Peon, NP, patient has not filled metformin as of today. Last HgbA1c 15% on 01/10/2013, clinic POCT fasting BG > 400mg /dL today. Patient prescribed Humalog 75/25 KwikPen today with instructions to inject 10 units SubQ BID AC breakfast and dinner.     Writer met with patient with demonstration insulin pen and [employer] Spanish language line interpreter. Patient voiced that she is familiar with using insulin pens as she has used one in the past. Following demonstration of pen technique, patient able to correctly verbalize and demonstrate priming of pen, preparation of pen and needle, proper injection technique, and discarding of used needle. One box of nano pen needles and several alcohol pads provided to patient. Patient will return on 01/23/13 to meet with PharmD for diabetes management.

## 2013-01-19 NOTE — Progress Notes (Signed)
[facility]  Diabetes Follow-Up    Chief Complaint:   Follow-up diabetes    PMH - Diabetes,  Hyperlipidemia    Last admission:  12/23/13 - Hyperglycemia (583), L ear pain, abd. pan    HPI:   This patient is a 32 y.o. year old female here for f/u visit for DM.     Last seen on 3/21 with elevated BS.  Sent to the ED for evaluation & given insulin in ED.  She was discharged home with po meds only.  She reutrns to clinic today not feeling well - complaining of headache & fatigue.  POCT  BS= 463.  She reports that she did not take her Metformin today but she also did not eat breakfast.  Given po fluids and repeat BS 403.  No nausea, vominting or abdominal pain.    Diabetes  POCT = 463,  Repeated after pt. Given po fluids ( 403)  BS range:  (am 308-356) (pm 330-365);  She did not bring her BS log; instructed to bring log to every   `clinic visit  Taking Metformin 1000mg   2x/day  Discussed importance of being on a Diabetic diet,  Exercise, taking medications properly.  Patient   Will benefit from [facility] teaching.  [facility] referral sent today (01/19/13)  Lab Results   Component Value Date    HGBA1C 15.0* 01/10/2013   Discussed HgbA1C reading. Patient informed that she will need to be placed on Insulin.  She is agreeable to this.  Will start Humalog Insulin Pen 75/25 (10 units bid).  Free voucher given & instructed  To get insulin today & start immediately.  Referred to Pharmacist.  Discussed plan with Pharmacist who agrees.      Language/Use of interpreter?  Speaks Spanish - phone interpreter used  Patient has/is using a glucometer?  yes  Glucose log result:  yes    Physical Exam:   There were no vitals filed for this visit.    General: awake, alert, oriented x 3; no acute distress.  HEENT: perrla, eomi, sclera anicteric,  oropharynx clear without lesions, mucous membranes moist  Neck: supple, no lymphadenopathy, no thyromegaly, no JVD, no carotid bruits  Cardiovascular: regular rate and rhythm, no murmurs, rubs or  gallops  Lungs: clear to auscultation bilaterally, without wheezing, rhonchi, or rales  Abdomen: soft, non-tender, non-distended; no palpable masses, no hepatosplenomegaly, normoactive bowel sounds, no rebound or guarding  Extremities: no clubbing, cyanosis, or edema  Neuro: cranial nerves grossly intact, strength 5/5 in upper and lower extremities, sensation intact  Skin: no rashes or lesions noted    Medications:     Prior to Admission medications    Medication Sig Start Date End Date Taking? Authorizing Provider   lisinopril (PRINIVIL,ZESTRIL) 5 MG tablet Take 1 tablet (5 mg total) by mouth daily. 01/10/13   Parke Simmers, NP   metFORMIN (GLUCOPHAGE) 1000 MG tablet Take 1 tablet (1,000 mg total) by mouth 2 (two) times daily with meals. 01/10/13   Parke Simmers, NP   metFORMIN (GLUCOPHAGE) 500 MG tablet Take 500 mg by mouth 2 (two) times daily with meals.    [provider]       Diagnostics:     Lab Results   Component Value Date    WBC 11.71* 12/29/2012    HGB 13.7 12/29/2012    HCT 39.1 12/29/2012    MCV 79.5* 12/29/2012    PLT 426* 12/29/2012     Lab Results   Component  Value Date    CREAT 0.51* 01/10/2013    BUN 7 01/10/2013    NA 134 01/10/2013    K 4.3 01/10/2013    CL 96* 01/10/2013    CO2 23 01/10/2013       Lab Results   Component Value Date    HGBA1C 15.0* 01/10/2013     No results found for this basename: CHOL     No results found for this basename: HDL     No results found for this basename: LDL     No results found for this basename: TRIG       Lab Results   Component Value Date    MICROALB 4.1 01/10/2013       Assessment:     Patient Active Problem List   Diagnosis   . Diabetes mellitus without complication   . Hyperlipidemia     DM:  Not controlled with elevated HgbA1C & elevated BS readings.  Will start Insulin today & continue   Metformin 100mg  bid.  Pt. Referred to Pharmacist & [facility].  Will continue to follow patient for    Now, follow BS readings & adjust meds accordingly.  Insulin will be managed by  Pharmacsit.    Hyperlipidemia:  Unable to check lipid panel today d/t urgency of hyperglycemia issue today.  Will   Draw lipid panel at next few visits.  Instructed to follow a diabetic diet & exercise.    Plan:   1.  HgbA1C =15.  Will start  Insulin - Humalog Insulin (Kwik Pen)  - 10/10  2.  Continue to take Metformin 1000mg  2x/day.  3.  Continue taking  blood sugars 2x/day - in the morning before breakfast & in the evening (2 hours  after dinner), record your  blood sugar readings in log  & always bring your book to every  clinic visit.  4.  Walk or exercise  3-5 x/week.  5.  F/U visit to see Pharmacist & Nurse Practitioner next week (Tuesday).  6.  If you have any questions/problems,  Call our clinic [facility]   [phone])  7.  Next visit - ASA      DIABETES QUAILTY MEASURES    IS PATIENT AT MINIMUM ON METFORMIN IF TYPE II DIABETIC? Yes  Should try to even if patient on insulin, contraindications incude Cre >1.5 men, >1.4 women, GI intolerance, severe systolic heart failure, liver failure.    HAS ASSESSMENT FOR DIABETIC NEPHROPATHY BEEN MADE? Yes  Urine spot protein/creatinine ratio>30 or positive urine for microalbumin,  not necessary to do if patient already on ACE or ARB or patient has existing CKD.    IS THE PATIENT ON AN ACE INHIBITOR (OR ARB IF CAN'T TOLERATE)? Yes  Indications are co-morbid CKD, HTN, MI, systolic HF, or proteinuria/microalbinuria.    WAS LIPID THERAPY INITIATED? No  Moderate intensity statins recommended with dx of DM age 29-75, high intensity statins with ASCVD risk > 7.5%    IS BP GOAL <140/80 MET? Yes  First line choice of antihypertensive should be ACEI or ARB.    WAS SMOKING CESSATION ADDRESSED IF THE PATIENT IS A SMOKER? Non-smoker    IS ASA ON BOARD WITH COMORBID CAD OR HIGH CV RISK? Yes  Would recommend in patieents with comorbid CAD, HTN, albuminuria, CVD, PVD, or if the patient is a smoker or has a strong family history.    IS THE A1C AT GOAL OF 7?  No  Goal may be higher if  elderly or prone to hypoglycemia.    WAS A FOOT EXAM COMPLETED? Yes    HAS THE PATIENT BEEN REFERRED TO PODIATRY? Yes  Would refer if patient has any symptoms of neuropathy, PVD, past history of foot ulcers or infections.    HAS THE PATIENT BEEN REFERRED TO THE PHARMACIST? Yes  Would consider referral if patient on insulin and not controlled.    HAS THE PATIENT BEEN REFERRED TO [facility]? Yes    WAS MEDICATION RECONCILIATION PERFORMED? Yes       FOLLOW-UP PLAN:    PROGRESS ON ESTABLISHING A MEDICAL HOME:

## 2013-01-19 NOTE — Addendum Note (Signed)
Addended by: Osvaldo Angst on: 01/19/2013 04:25 PM     Modules accepted: Orders

## 2013-01-23 ENCOUNTER — Ambulatory Visit (INDEPENDENT_AMBULATORY_CARE_PROVIDER_SITE_OTHER): Payer: Charity

## 2013-01-23 ENCOUNTER — Ambulatory Visit (INDEPENDENT_AMBULATORY_CARE_PROVIDER_SITE_OTHER): Payer: Charity | Admitting: Registered Nurse

## 2013-01-23 VITALS — BP 101/55 | HR 94 | Temp 98.4°F | Resp 16 | Wt 118.1 lb

## 2013-01-23 LAB — POCT GLUCOSE: Whole Blood Glucose POCT: 363 mg/dL — AB (ref 70–100)

## 2013-01-23 NOTE — Patient Instructions (Addendum)
1.  Continue Metformin 1000mg   2x/day.    2.  Insulin dose as per pharmacist:    3.  Follow a diabetic diet -  Low carbohydrates,  Low sugar diet    4.  Take your blood sugars 4x/day - before breakfast, before lunch, before dinner   And at bedtime.  Write down your BS in your book & bring your book to   Every clinic visit.    5.  Walk or exercise 3-5 times a week.    6.  F/U visit to see nurse practitioner in 2 weeks.    7.  If you have any questions/problems,  Call our clinic [facility]  [phone])

## 2013-01-23 NOTE — Patient Instructions (Addendum)
Instructions for Diabetes:    1. Increase Humalog 75/25 insulin to 15 units injected under the skin before breakfast and before dinner.  You have to eat after you inject insulin.    2. Please check your blood sugar before breakfast, before dinner and before bedtime (or 2 hours after dinner).    3. Continue taking the metformin 1000 mg twice a day with meals.    4. Try to eat a more balanced diet, more vegetables and less fried foods.    5. Try to walk 15 minutes a day.

## 2013-01-23 NOTE — Progress Notes (Signed)
Kelsey Miles is a 32 y/o Spanish speaking F with PMH of pregnancy induced diabetes (per chart) presents for diabetes management.  A Spanish interpretor was used for the entire encounter.    Subjective:  Patient reports that she has had diabetes for 7 years. She previously was on "N type insulin" 16 units in the morning and 8 units in the evening and she stated she had "R-type insulin" but could not remember how much of that she used or whether it was a sliding scale.  Patient reports that she as been feeling tired, thirst and going to the bathroom frequently.  She denied any symptoms of hypoglycemia.  Patient denies any missed doses of her medication.    Patient also reported a fever Friday through Sunday.  She states that she did not have a fever on Monday or today but still has the chills or is sweating a lot.  Notified Denny Peon, NP who saw the patient today.    In terms of diet, the patient admits that she only "eats when she is hungry".  She knows that she needs to eat something after injecting her insulin.  The patient will eat breakfast anywhere between 9 and 11am and consists of eggs, beans, and tortillas.  She will usually drink tea with Splenda in it.  The patient does not always eat lunch - if she does it is around 2pm and is something light like chicken soup.  The patient said dinner is usually something like fried pork meat and she does not eat a lot of vegetables.  She eats dinner around 6 - 9 pm.  Other than tea with Splenda in it in the morning, the patient only drinks water throughout the day.    The patient admits that she does not exercise except maybe walking for 5 minutes.    Review of Current Medications:  Metformin 1000 mg twice a day with food  Lisinopril 5 mg once a day  Humalog 75/25 inject 10 units twice a day before breakfast and before dinner    Patient Blood Glucose Log:  Date Before Breakfast 2 hours after Breakfast   01/20/13 398 545   01/21/13 370 357   01/22/13 304 504   01/23/13 299  ---       Objective:  POCT (01/23/13): 363 mg/dL  [id number] (0/4/54): 09%    Assessment:    Patient blood glucose is uncontrolled and patient is experiencing symptoms of hyperglycemia.  Goal HgbA1C < 7% due to patient's age and no complications from Diabetes.    Plan: The following plan was developed per collaborative practice agreement with Lianne Bushy, MD. Patient informed of collaborative practice and consent form signed on 01/23/2013. Medication discontinuation or initiation developed in collaboration with and authorized by Denny Peon, NP    1. Increase Humalog 75/25 insulin to 15 units injected before breakfast and before dinner.    2. Continue metformin 1000 mg twice a day with meals.    3. Check blood glucose 3x/day, before breakfast, before dinner and before bedtime (or 2 hours after dinner) and record these numbers in blood glucose log.  Provided test strips to the patient.    4. Patient brought W2 and completed Lantus PAP today.  Plan will be to transition her to Lantus once approved.    5. Encouraged the patient to eat at more consistent times and to eat a more balanced diet with vegetables and avoiding fried or greasy food.    6. Encouraged patient to  walk at least 15 minutes a day.    7. Educated the patient about signs of hypoglycemia and how to treat it.    8. Follow up with PharmD in 1 week    Patient agreed with the plan and verbalized understanding.      Rodolph Bong, PharmD  PGY1 Pharmacy Practice Resident

## 2013-01-24 LAB — CBC AND DIFFERENTIAL
Baso(Absolute): 0 10*3/uL (ref 0.0–0.2)
Basos: 0 % (ref 0–3)
Eos: 2 % (ref 0–5)
Eosinophils Absolute: 0.2 10*3/uL (ref 0.0–0.4)
Hematocrit: 35.5 % (ref 34.0–46.6)
Hemoglobin: 11.5 g/dL (ref 11.1–15.9)
Immature Granulocytes Absolute: 0 10*3/uL (ref 0.0–0.1)
Immature Granulocytes: 0 % (ref 0–2)
Lymphocytes Absolute: 2.3 10*3/uL (ref 0.7–3.1)
Lymphocytes: 29 % (ref 14–46)
MCH: 26.7 pg (ref 26.6–33.0)
MCHC: 32.4 g/dL (ref 31.5–35.7)
MCV: 82 fL (ref 79–97)
Monocytes Absolute: 0.8 10*3/uL (ref 0.1–0.9)
Monocytes: 10 % (ref 4–12)
Neutrophils Absolute: 4.6 10*3/uL (ref 1.4–7.0)
Neutrophils: 59 % (ref 40–74)
Platelets: 454 10*3/uL — ABNORMAL HIGH (ref 155–379)
RBC: 4.31 x10E6/uL (ref 3.77–5.28)
RDW: 13.4 % (ref 12.3–15.4)
WBC: 7.9 10*3/uL (ref 3.4–10.8)

## 2013-01-24 LAB — BASIC METABOLIC PANEL
BUN / Creatinine Ratio: 27 — ABNORMAL HIGH (ref 8–20)
BUN: 12 mg/dL (ref 6–20)
CO2: 21 mmol/L (ref 19–28)
Calcium: 9.5 mg/dL (ref 8.7–10.2)
Chloride: 98 mmol/L (ref 97–108)
Creatinine: 0.45 mg/dL — ABNORMAL LOW (ref 0.57–1.00)
EGFR: 135 mL/min/{1.73_m2} (ref >59)
EGFR: 155 mL/min/{1.73_m2} (ref >59)
Glucose: 219 mg/dL — ABNORMAL HIGH (ref 65–99)
Potassium: 4.2 mmol/L (ref 3.5–5.2)
Sodium: 136 mmol/L (ref 134–144)

## 2013-01-24 NOTE — Progress Notes (Signed)
Patient encounter discussed with pharmacy resident. I have reviewed and agree with pharmacy resident's assessment and plan.     Ryonna Cimini, PharmD, BCPS  Clinical Pharmacy Specialist

## 2013-01-28 LAB — URINALYSIS REFLEX TO MICROSCOPIC EXAM - REFLEX TO CULTURE
Bilirubin, UA: NEGATIVE
Ketones UA: NEGATIVE
Nitrite, UA: NEGATIVE
Urine Specific Gravity: 1.025 (ref 1.005–1.030)
Urobilinogen, Ur: 1 mg/dL (ref 0.0–1.9)
pH, UA: 6 (ref 5.0–7.5)

## 2013-01-28 LAB — REFLEX - MICROSCOPIC EXAMINATION
Epithelial Cells (non renal): >10 /hpf — AB (ref 0–10)
WBC, UA: >30 /hpf — AB (ref 0–?)

## 2013-01-28 LAB — REFLEX - URINE CULTURE, ROUTINE

## 2013-01-28 NOTE — Progress Notes (Signed)
Subjective:       Patient ID: Kelsey Miles is a 32 y.o. female.    HPI  Here for f/u visit for DM and seeing both pharmacist & NP.   Patient is feeling better today.  At last visit,  BS elevated - given IVF and patient started on Insulin (Humalog Kwik Pen 10/10).  BS today better but still up ( 363).  Insulin dose adjusted by pharmacist (see pharmacy note) and will continue Metformin.  She reports feeling feverish over past few days (did not take Temp).    Today - afebrile, VSS.  Examined (no obvious signs of infection).  I have informed patient to take tylenol if she is feeling feverish & will reevaluate at next visit.    POCT  BS = 363  BS range:  (am 304-398)  (pm 292-545)  Taking Humalog Pen  ( 10 units am / 10 units pm)  & Metformin 1000mg  bid  No hypoglycemic episodes  Lab Results   Component Value Date    HGBA1C 15.0* 01/10/2013   Pt. Educated about DM,  HgbA1C, medications incluing insulin.  She will benefit with more teaching from [facility].      Review of Systems   Constitutional: Negative for fever and chills.   HENT: Negative.    Eyes: Negative.    Respiratory: Negative for cough and shortness of breath.    Cardiovascular: Negative for chest pain and leg swelling.   Gastrointestinal: Negative.    Genitourinary: Negative.    Musculoskeletal: Negative.    Neurological: Negative.    Hematological: Negative.    Psychiatric/Behavioral: Negative.            Objective:    Physical Exam   Constitutional: She is oriented to person, place, and time. She appears well-developed and well-nourished.   HENT:   Head: Normocephalic.   Eyes: Pupils are equal, round, and reactive to light.   Neck: Normal range of motion. Neck supple.   Cardiovascular: Normal rate, regular rhythm and normal heart sounds.  Exam reveals no gallop and no friction rub.    No murmur heard.  Pulmonary/Chest: Effort normal and breath sounds normal. No respiratory distress. She has no wheezes.   Abdominal: Soft. Bowel sounds are  normal.   Musculoskeletal: Normal range of motion.   Neurological: She is alert and oriented to person, place, and time.   Skin: Skin is warm and dry.   Psychiatric: She has a normal mood and affect.       Chemistry        Component Value Date/Time    NA 136 01/23/2013 1107    K 4.2 01/23/2013 1107    CL 98 01/23/2013 1107    CO2 21 01/23/2013 1107    BUN 12 01/23/2013 1107    GLU 219* 01/23/2013 1107    GLU 233* 12/29/2012 2028        Component Value Date/Time    ALKPHOS 208* 01/10/2013 0000    AST 13 01/10/2013 0000    ALT 9 01/10/2013 0000        Lab Results   Component Value Date    WBC 7.9 01/23/2013    HGB 11.5 01/23/2013    HCT 35.5 01/23/2013    MCV 82 01/23/2013    PLT 454* 01/23/2013     Lab Results   Component Value Date    HGBA1C 15.0* 01/10/2013         Assessment:  1.  DM:  Not controlled with HgbA1C = 15.0.  Started on Insulin at last visit along with Metformin.  She    Is tolerating well.  Referred to Pharmacist for Insulin Mgmt.  BS remains elevated but overal   Numbers are going down.  Will have pharmacist continue Insulin mgmt.  Will continue    Metformin.  Will continue to educate patient about DM.  Will have IDC do more DM   Education.      Plan:       1.  Continue Metformin 1000mg   2x/day.    2.  Insulin dose as per pharmacist:    3.  Follow a diabetic diet -  Low carbohydrates,  Low sugar diet    4.  Take your blood sugars 4x/day - before breakfast, before lunch, before dinner   And at bedtime.  Write down your BS in your book & bring your book to   Every clinic visit.    5.  Walk or exercise 3-5 times a week.    6.  F/U visit to see nurse practitioner in 2 weeks.    7.  If you have any questions/problems,  Call our clinic [facility]  [phone])

## 2013-01-30 ENCOUNTER — Ambulatory Visit (INDEPENDENT_AMBULATORY_CARE_PROVIDER_SITE_OTHER): Payer: Charity | Admitting: Registered Nurse

## 2013-02-06 ENCOUNTER — Encounter (INDEPENDENT_AMBULATORY_CARE_PROVIDER_SITE_OTHER): Payer: Charity

## 2013-03-05 ENCOUNTER — Other Ambulatory Visit (INDEPENDENT_AMBULATORY_CARE_PROVIDER_SITE_OTHER): Payer: Self-pay

## 2013-03-19 ENCOUNTER — Other Ambulatory Visit (INDEPENDENT_AMBULATORY_CARE_PROVIDER_SITE_OTHER): Payer: Self-pay

## 2013-04-02 ENCOUNTER — Other Ambulatory Visit (INDEPENDENT_AMBULATORY_CARE_PROVIDER_SITE_OTHER): Payer: Self-pay

## 2013-04-16 ENCOUNTER — Other Ambulatory Visit (INDEPENDENT_AMBULATORY_CARE_PROVIDER_SITE_OTHER): Payer: Self-pay

## 2013-04-30 ENCOUNTER — Other Ambulatory Visit (INDEPENDENT_AMBULATORY_CARE_PROVIDER_SITE_OTHER): Payer: Self-pay

## 2013-05-14 ENCOUNTER — Other Ambulatory Visit (INDEPENDENT_AMBULATORY_CARE_PROVIDER_SITE_OTHER): Payer: Self-pay

## 2014-04-27 ENCOUNTER — Inpatient Hospital Stay: Payer: Charity | Admitting: Internal Medicine

## 2014-04-27 ENCOUNTER — Emergency Department: Payer: Charity

## 2014-04-27 ENCOUNTER — Inpatient Hospital Stay
Admission: EM | Admit: 2014-04-27 | Discharge: 2014-04-29 | DRG: 689 | Disposition: A | Discharge status: Home / Self Care | Payer: Self-pay | Attending: Pediatrics | Admitting: Pediatrics

## 2014-04-27 DIAGNOSIS — M109 Gout, unspecified: Secondary | ICD-10-CM | POA: Diagnosis present

## 2014-04-27 DIAGNOSIS — E876 Hypokalemia: Secondary | ICD-10-CM | POA: Diagnosis present

## 2014-04-27 DIAGNOSIS — A498 Other bacterial infections of unspecified site: Secondary | ICD-10-CM | POA: Diagnosis present

## 2014-04-27 DIAGNOSIS — E1110 Type 2 diabetes mellitus with ketoacidosis without coma: Secondary | ICD-10-CM | POA: Diagnosis present

## 2014-04-27 DIAGNOSIS — N10 Acute tubulo-interstitial nephritis: Principal | ICD-10-CM | POA: Diagnosis present

## 2014-04-27 DIAGNOSIS — E559 Vitamin D deficiency, unspecified: Secondary | ICD-10-CM | POA: Diagnosis present

## 2014-04-27 DIAGNOSIS — E861 Hypovolemia: Secondary | ICD-10-CM | POA: Diagnosis present

## 2014-04-27 DIAGNOSIS — Z91199 Patient's noncompliance with other medical treatment and regimen due to unspecified reason: Secondary | ICD-10-CM

## 2014-04-27 DIAGNOSIS — N390 Urinary tract infection, site not specified: Secondary | ICD-10-CM | POA: Diagnosis present

## 2014-04-27 DIAGNOSIS — N12 Tubulo-interstitial nephritis, not specified as acute or chronic: Secondary | ICD-10-CM | POA: Diagnosis present

## 2014-04-27 DIAGNOSIS — E871 Hypo-osmolality and hyponatremia: Secondary | ICD-10-CM | POA: Diagnosis present

## 2014-04-27 DIAGNOSIS — E1310 Other specified diabetes mellitus with ketoacidosis without coma: Secondary | ICD-10-CM | POA: Diagnosis present

## 2014-04-27 HISTORY — DX: Gout, unspecified: M10.9

## 2014-04-27 LAB — PT AND APTT
PT INR: 1.2 — ABNORMAL HIGH (ref 0.9–1.1)
PT: 14.8 s (ref 12.6–15.0)
PTT: 31 s (ref 23–37)

## 2014-04-27 LAB — BASIC METABOLIC PANEL
BUN: 8 mg/dL (ref 7.0–19.0)
BUN: 4 mg/dL — ABNORMAL LOW (ref 7.0–19.0)
BUN: 5 mg/dL — ABNORMAL LOW (ref 7.0–19.0)
BUN: 4 mg/dL — ABNORMAL LOW (ref 7.0–19.0)
BUN: 5 mg/dL — ABNORMAL LOW (ref 7.0–19.0)
CO2: 15 mEq/L — ABNORMAL LOW (ref 22–29)
CO2: 17 mEq/L — ABNORMAL LOW (ref 22–29)
CO2: 19 mEq/L — ABNORMAL LOW (ref 22–29)
CO2: 20 mEq/L — ABNORMAL LOW (ref 22–29)
CO2: 19 mEq/L — ABNORMAL LOW (ref 22–29)
Calcium: 8.6 mg/dL (ref 8.5–10.5)
Calcium: 7.4 mg/dL — ABNORMAL LOW (ref 8.5–10.5)
Calcium: 7 mg/dL — ABNORMAL LOW (ref 8.5–10.5)
Calcium: 6.8 mg/dL — ABNORMAL LOW (ref 8.5–10.5)
Calcium: 7.4 mg/dL — ABNORMAL LOW (ref 8.5–10.5)
Chloride: 97 mEq/L — ABNORMAL LOW (ref 100–111)
Chloride: 101 mEq/L (ref 100–111)
Chloride: 107 mEq/L (ref 100–111)
Chloride: 110 mEq/L (ref 100–111)
Chloride: 107 mEq/L (ref 100–111)
Creatinine: 0.7 mg/dL (ref 0.6–1.0)
Creatinine: 0.6 mg/dL (ref 0.6–1.0)
Creatinine: 0.6 mg/dL (ref 0.6–1.0)
Creatinine: 0.5 mg/dL — ABNORMAL LOW (ref 0.6–1.0)
Creatinine: 0.6 mg/dL (ref 0.6–1.0)
Glucose: 418 mg/dL — ABNORMAL HIGH (ref 70–100)
Glucose: 278 mg/dL — ABNORMAL HIGH (ref 70–100)
Glucose: 253 mg/dL — ABNORMAL HIGH (ref 70–100)
Glucose: 178 mg/dL — ABNORMAL HIGH (ref 70–100)
Glucose: 208 mg/dL — ABNORMAL HIGH (ref 70–100)
Potassium: 3.4 mEq/L — ABNORMAL LOW (ref 3.5–5.1)
Potassium: 2.9 mEq/L — ABNORMAL LOW (ref 3.5–5.1)
Potassium: 3.3 mEq/L — ABNORMAL LOW (ref 3.5–5.1)
Potassium: 3.4 mEq/L — ABNORMAL LOW (ref 3.5–5.1)
Potassium: 4.2 mEq/L (ref 3.5–5.1)
Sodium: 127 mEq/L — ABNORMAL LOW (ref 136–145)
Sodium: 131 mEq/L — ABNORMAL LOW (ref 136–145)
Sodium: 135 mEq/L — ABNORMAL LOW (ref 136–145)
Sodium: 137 mEq/L (ref 136–145)
Sodium: 137 mEq/L (ref 136–145)

## 2014-04-27 LAB — URINALYSIS WITH MICROSCOPIC
Bilirubin, UA: NEGATIVE
Blood, UA: NEGATIVE
Glucose, UA: >1000 — AB
Ketones UA: >=80 — AB
Nitrite, UA: POSITIVE — AB
Protein, UR: 30 — AB
Specific Gravity UA: 1.018 (ref 1.001–1.035)
Urine pH: 6.5 (ref 5.0–8.0)
Urobilinogen, UA: NORMAL mg/dL

## 2014-04-27 LAB — CBC AND DIFFERENTIAL
Basophils Absolute Automated: 0.03 10*3/uL (ref 0.00–0.20)
Basophils Automated: 0 %
Eosinophils Absolute Automated: 0.04 10*3/uL (ref 0.00–0.70)
Eosinophils Automated: 0 %
Hematocrit: 35.8 % — ABNORMAL LOW (ref 37.0–47.0)
Hgb: 12 g/dL (ref 12.0–16.0)
Immature Granulocytes Absolute: 0.02 10*3/uL
Immature Granulocytes: 0 %
Lymphocytes Absolute Automated: 1.44 10*3/uL (ref 0.50–4.40)
Lymphocytes Automated: 14 %
MCH: 27.6 pg — ABNORMAL LOW (ref 28.0–32.0)
MCHC: 33.5 g/dL (ref 32.0–36.0)
MCV: 82.5 fL (ref 80.0–100.0)
MPV: 10.1 fL (ref 9.4–12.3)
Monocytes Absolute Automated: 0.57 10*3/uL (ref 0.00–1.20)
Monocytes: 5 %
Neutrophils Absolute: 8.54 10*3/uL — ABNORMAL HIGH (ref 1.80–8.10)
Neutrophils: 80 %
Nucleated RBC: 0 /100 WBC (ref 0–1)
Platelets: 295 10*3/uL (ref 140–400)
RBC: 4.34 10*6/uL (ref 4.20–5.40)
RDW: 13 % (ref 12–15)
WBC: 10.69 10*3/uL (ref 3.50–10.80)

## 2014-04-27 LAB — I-STAT CG4 VENOUS CARTRIDGE
Lactic Acid I-Stat: 1.8 mEq/L (ref 0.5–2.2)
i-STAT Base Excess Venous: -1 mEq/L
i-STAT FIO2: 2
i-STAT HCO3 Bicarbonate Venous: 18.8 mEq/L
i-STAT O2 Saturation Venous: 79 %
i-STAT Patient Temperature: 101.4
i-STAT Total CO2 Venous: 19 mEq/L
i-STAT pCO2 Venous: 21.5
i-STAT pH Venous: 7.555
i-STAT pO2 Venous: 39

## 2014-04-27 LAB — GLUCOSE WHOLE BLOOD - POCT
Whole Blood Glucose POCT: 207 mg/dL — ABNORMAL HIGH (ref 70–100)
Whole Blood Glucose POCT: 253 mg/dL — ABNORMAL HIGH (ref 70–100)
Whole Blood Glucose POCT: 328 mg/dL — ABNORMAL HIGH (ref 70–100)
Whole Blood Glucose POCT: 251 mg/dL — ABNORMAL HIGH (ref 70–100)
Whole Blood Glucose POCT: 249 mg/dL — ABNORMAL HIGH (ref 70–100)
Whole Blood Glucose POCT: 161 mg/dL — ABNORMAL HIGH (ref 70–100)
Whole Blood Glucose POCT: 182 mg/dL — ABNORMAL HIGH (ref 70–100)
Whole Blood Glucose POCT: 189 mg/dL — ABNORMAL HIGH (ref 70–100)
Whole Blood Glucose POCT: 173 mg/dL — ABNORMAL HIGH (ref 70–100)
Whole Blood Glucose POCT: 225 mg/dL — ABNORMAL HIGH (ref 70–100)

## 2014-04-27 LAB — GFR
EGFR: >60
EGFR: >60
EGFR: >60
EGFR: >60
EGFR: >60

## 2014-04-27 LAB — PHOSPHORUS
Phosphorus: 1.9 mg/dL — ABNORMAL LOW (ref 2.3–4.7)
Phosphorus: 1.8 mg/dL — ABNORMAL LOW (ref 2.3–4.7)
Phosphorus: 3.1 mg/dL (ref 2.3–4.7)

## 2014-04-27 LAB — MAGNESIUM
Magnesium: 1.4 mg/dL — ABNORMAL LOW (ref 1.6–2.6)
Magnesium: 1.9 mg/dL (ref 1.6–2.6)
Magnesium: 1.7 mg/dL (ref 1.6–2.6)

## 2014-04-27 LAB — HEPATIC FUNCTION PANEL
ALT: 13 U/L (ref 0–55)
AST (SGOT): 19 U/L (ref 5–34)
Albumin/Globulin Ratio: 0.9 (ref 0.9–2.2)
Albumin: 3.2 g/dL — ABNORMAL LOW (ref 3.5–5.0)
Alkaline Phosphatase: 172 U/L — ABNORMAL HIGH (ref 37–106)
Bilirubin Direct: 0.2 mg/dL (ref 0.0–0.5)
Bilirubin Indirect: 0.3 mg/dL (ref 0.0–1.1)
Bilirubin, Total: 0.5 mg/dL (ref 0.2–1.2)
Globulin: 3.7 g/dL — ABNORMAL HIGH (ref 2.0–3.6)
Protein, Total: 6.9 g/dL (ref 6.0–8.3)

## 2014-04-27 LAB — HEMOGLOBIN A1C: Hemoglobin A1C: 14.8 % — ABNORMAL HIGH (ref 0.0–6.0)

## 2014-04-27 MED ORDER — KCL IN DEXTROSE-NACL 20-5-0.9 MEQ/L-%-% IV SOLN
INTRAVENOUS | Status: DC
Start: 2014-04-27 — End: 2014-04-27

## 2014-04-27 MED ORDER — INSULIN REGULAR HUMAN 100 UNIT/ML IJ SOLN
10.0000 [IU] | Freq: Once | INTRAMUSCULAR | Status: AC
Start: 2014-04-27 — End: 2014-04-27
  Administered 2014-04-27: 10 [IU] via INTRAVENOUS
  Filled 2014-04-27: qty 100

## 2014-04-27 MED ORDER — SODIUM CHLORIDE 0.9 % IV SOLN
0.1000 [IU]/kg/h | INTRAVENOUS | Status: AC
Start: 2014-04-27 — End: 2014-04-27
  Administered 2014-04-27: 0.1 [IU]/kg/h via INTRAVENOUS
  Administered 2014-04-27: 5 [IU]/h via INTRAVENOUS
  Administered 2014-04-27: 2 [IU]/h via INTRAVENOUS
  Filled 2014-04-27: qty 1

## 2014-04-27 MED ORDER — ACETAMINOPHEN 500 MG PO TABS
1000.0000 mg | ORAL_TABLET | Freq: Once | ORAL | Status: AC
Start: 2014-04-27 — End: 2014-04-27
  Administered 2014-04-27: 1000 mg via ORAL
  Filled 2014-04-27: qty 2

## 2014-04-27 MED ORDER — HEPARIN SODIUM (PORCINE) 5000 UNIT/ML IJ SOLN
5000.0000 [IU] | Freq: Three times a day (TID) | INTRAMUSCULAR | Status: DC
Start: 2014-04-27 — End: 2014-04-29
  Administered 2014-04-27 – 2014-04-29 (×7): 5000 [IU] via SUBCUTANEOUS
  Filled 2014-04-27 (×7): qty 1

## 2014-04-27 MED ORDER — DEXTROSE 50 % IV SOLN
50.0000 mL | INTRAVENOUS | Status: DC | PRN
Start: 2014-04-27 — End: 2014-04-27

## 2014-04-27 MED ORDER — SODIUM CHLORIDE 0.9 % IV MBP
1.0000 g | INTRAVENOUS | Status: DC
Start: 2014-04-28 — End: 2014-04-29
  Administered 2014-04-28 – 2014-04-29 (×2): 1 g via INTRAVENOUS
  Filled 2014-04-27 (×2): qty 1000

## 2014-04-27 MED ORDER — MORPHINE SULFATE 2 MG/ML IJ/IV SOLN (WRAP)
1.0000 mg | Status: DC | PRN
Start: 2014-04-27 — End: 2014-04-29
  Administered 2014-04-27 (×3): 1 mg via INTRAVENOUS
  Filled 2014-04-27 (×3): qty 1

## 2014-04-27 MED ORDER — HYDROMORPHONE HCL PF 1 MG/ML IJ SOLN
1.0000 mg | Freq: Once | INTRAMUSCULAR | Status: AC
Start: 2014-04-27 — End: 2014-04-27
  Administered 2014-04-27: 1 mg via INTRAVENOUS
  Filled 2014-04-27: qty 1

## 2014-04-27 MED ORDER — MAGNESIUM SULFATE IN D5W 10-5 MG/ML-% IV SOLN
1.0000 g | INTRAVENOUS | Status: AC
Start: 2014-04-27 — End: 2014-04-27
  Administered 2014-04-27 (×2): 1 g via INTRAVENOUS
  Filled 2014-04-27 (×2): qty 100

## 2014-04-27 MED ORDER — DEXTROSE 50 % IV SOLN
25.0000 mL | INTRAVENOUS | Status: DC | PRN
Start: 2014-04-27 — End: 2014-04-29

## 2014-04-27 MED ORDER — POTASSIUM CHLORIDE IN NACL 20-0.9 MEQ/L-% IV SOLN
INTRAVENOUS | Status: DC
Start: 2014-04-27 — End: 2014-04-27

## 2014-04-27 MED ORDER — INSULIN GLARGINE 100 UNIT/ML SC SOLN
15.0000 [IU] | Freq: Every evening | SUBCUTANEOUS | Status: DC
Start: 2014-04-27 — End: 2014-04-29
  Administered 2014-04-27 – 2014-04-28 (×3): 15 [IU] via SUBCUTANEOUS
  Filled 2014-04-27 (×2): qty 15

## 2014-04-27 MED ORDER — POTASSIUM CHLORIDE 10 MEQ/100ML IV SOLN
10.0000 meq | INTRAVENOUS | Status: DC
Start: 2014-04-27 — End: 2014-04-27
  Administered 2014-04-27: 10 meq via INTRAVENOUS
  Filled 2014-04-27: qty 100

## 2014-04-27 MED ORDER — SODIUM CHLORIDE 0.9 % IV BOLUS
1000.0000 mL | Freq: Once | INTRAVENOUS | Status: AC
Start: 2014-04-27 — End: 2014-04-27
  Administered 2014-04-27: 1000 mL via INTRAVENOUS

## 2014-04-27 MED ORDER — K PHOS NEUTRAL 250 MG PO TABS
500.0000 mg | ORAL_TABLET | Freq: Four times a day (QID) | ORAL | Status: AC
Start: 2014-04-27 — End: 2014-04-27
  Administered 2014-04-27 (×2): 2 via ORAL
  Filled 2014-04-27 (×2): qty 2

## 2014-04-27 MED ORDER — INSULIN ASPART 100 UNIT/ML SC SOLN
1.0000 [IU] | SUBCUTANEOUS | Status: DC | PRN
Start: 2014-04-27 — End: 2014-04-29
  Administered 2014-04-27 – 2014-04-28 (×2): 3 [IU] via SUBCUTANEOUS
  Administered 2014-04-28: 5 [IU] via SUBCUTANEOUS
  Administered 2014-04-28 – 2014-04-29 (×2): 3 [IU] via SUBCUTANEOUS
  Administered 2014-04-29 (×2): 1 [IU] via SUBCUTANEOUS
  Filled 2014-04-27 (×2): qty 30
  Filled 2014-04-27: qty 10
  Filled 2014-04-27: qty 50
  Filled 2014-04-27 (×2): qty 10
  Filled 2014-04-27: qty 30

## 2014-04-27 MED ORDER — IOHEXOL 350 MG/ML IV SOLN
100.0000 mL | Freq: Once | INTRAVENOUS | Status: AC | PRN
Start: 2014-04-27 — End: 2014-04-27
  Administered 2014-04-27: 100 mL via INTRAVENOUS

## 2014-04-27 MED ORDER — POTASSIUM CHLORIDE CRYS ER 20 MEQ PO TBCR
40.0000 meq | EXTENDED_RELEASE_TABLET | Freq: Once | ORAL | Status: AC
Start: 2014-04-27 — End: 2014-04-27
  Administered 2014-04-27: 40 meq via ORAL
  Filled 2014-04-27: qty 2

## 2014-04-27 MED ORDER — IOHEXOL 240 MG/ML IJ SOLN
50.0000 mL | Freq: Once | INTRAMUSCULAR | Status: AC | PRN
Start: 2014-04-27 — End: 2014-04-27
  Administered 2014-04-27: 50 mL via ORAL

## 2014-04-27 MED ORDER — GLUCAGON 1 MG IJ SOLR (WRAP)
1.0000 mg | INTRAMUSCULAR | Status: DC | PRN
Start: 2014-04-27 — End: 2014-04-29

## 2014-04-27 MED ORDER — DEXTROSE 50 % IV SOLN
25.0000 mL | INTRAVENOUS | Status: DC | PRN
Start: 2014-04-27 — End: 2014-04-27

## 2014-04-27 MED ORDER — GLUCOSE 40 % PO GEL
15.0000 g | ORAL | Status: DC | PRN
Start: 2014-04-27 — End: 2014-04-29

## 2014-04-27 MED ORDER — MAGNESIUM SULFATE IN D5W 10-5 MG/ML-% IV SOLN
1.0000 g | Freq: Once | INTRAVENOUS | Status: AC
Start: 2014-04-28 — End: 2014-04-28
  Administered 2014-04-27: 1 g via INTRAVENOUS
  Filled 2014-04-27: qty 100

## 2014-04-27 MED ORDER — POTASSIUM CHLORIDE IN NACL 20-0.9 MEQ/L-% IV SOLN
INTRAVENOUS | Status: DC
Start: 2014-04-27 — End: 2014-04-28
  Administered 2014-04-27 – 2014-04-28 (×3): 125 mL/h via INTRAVENOUS

## 2014-04-27 MED ORDER — ONDANSETRON HCL 4 MG/2ML IJ SOLN
4.0000 mg | Freq: Once | INTRAMUSCULAR | Status: AC
Start: 2014-04-27 — End: 2014-04-27
  Administered 2014-04-27: 4 mg via INTRAVENOUS
  Filled 2014-04-27: qty 2

## 2014-04-27 MED ORDER — SODIUM CHLORIDE 0.9 % IV MBP
1.0000 g | Freq: Once | INTRAVENOUS | Status: AC
Start: 2014-04-27 — End: 2014-04-27
  Administered 2014-04-27: 1 g via INTRAVENOUS
  Filled 2014-04-27: qty 1000
  Filled 2014-04-27: qty 50

## 2014-04-27 NOTE — H&P (Addendum)
ADMISSION HISTORY AND PHYSICAL EXAM    Date Time: 04/27/2014 9:44 AM  Patient Name: Kelsey Miles  Attending Physician: Mariea Stable*  Primary Care Physician: Christa See, MD (General)    CC: Back and Abdominal Pain           Assessment:   Active Problems:    UTI (lower urinary tract infection)    33 yo F with DM2 presents with back and abdominal pain found to have pyelonephritis and DKA.     Plan:   # Pyelonephritis:   - Ceftriaxone 1gm q24 hrs (first dose given in ED)  - f/u urine culture   - cont IV fluids, NS 156ml/hr     #DKA: pyelonephritis likely inciting factor. Gap 15 -> 13 in ED. Ketones present in urine. 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 Remaining hyponatremia likely 2/2 hypovolemia  - cont IV fluids     # DM2: On metformin however no PCP (gets metformin from mom)   - check Hgb A1C   - diabetic education   - social work help for connections to primary care       Disposition:     Today's date: 04/27/2014  Admit Date: 04/27/2014  2:11 AM  Anticipated medical stability for discharge:Red - not tomorrow - estimated month/date: 04/29/14  Service status: Inpatient: risk of morbidity and mortality  Reason for ongoing hospitalization: DKA and pyelonephritis   Anticipated discharge needs: Primary care follow up for diabetes     History of Presenting Illness:   Kelsey Miles is a 33 y.o. spanish speaking female with DM2 who presents to the hospital with 4-5 days of increasing back and abdominal pain. She states she started feeling sharp back pain 5 days ago. She does not remember what she was doing at the time but denies any injury. The pain continued to get worse and she started to develop diffuse abdominal pain and nausea. No vomiting until she arrived in the ED. She denies any dysuria  but does report increased urinary frequency a few days prior to her back pain developing. She endorses fevers and chills and general malaise. She was trying to use Excedrin and colchicine for her pain (she reports that she was told to use colchicine for body pain associated with her diabetes).  She has had similar pain in the past, 3 years ago when she was pregnant. She doesn't know if she was told she had pyelonephritis or a kidney infection at the time. Her pain and nausea became unbearable so she came to the ED today.     In the ED her blood sugar was found to be in the 400s with an anion gap of 15. She was tachycardic to 119 and had a fever of 101.6. UA consistent with UTI and CT scan showed acute pyelonephritis.     She does not have a primary care doctor. She gets her Metformin and Colchicine from her mom who sends it from [place]. She does not check her blood sugar at home.     In person spanish interpreter used for this interview     Past Medical History:     Past Medical History   Diagnosis Date   .  Diabetes mellitus associated with hormonal etiology 2007   . Gout        Available old records reviewed, including: none     Past Surgical History:     Past Surgical History   Procedure Laterality Date   . Cesarean section         Family History:   Dad, Mom, and brother with DM2   No hx of MI or stroke     Social History:     History   Smoking status   . Never Smoker    Smokeless tobacco   . Not on file     History   Alcohol Use No     History   Drug Use Not on file       Allergies:   No Known Allergies    Medications:     Current/Home Medications    COLCHICINE 0.6 MG TABLET    Take 0.6 mg by mouth daily.    METFORMIN (GLUCOPHAGE) 850 MG TABLET    Take 850 mg by mouth 2 (two) times daily with meals.         Method by which medications were confirmed on admission: confirmed with pt (brought meds with her)     Review of Systems:   All other systems were reviewed and are negative except:   + fevers, chills,  general malaise  + nausea, vomiting, diffuse abdominal pain. No diarrhea   + back pain   No vision changes, No headache   No chest pain or palpitations (feels heart beating fast)        Physical Exam:     Patient Vitals for the past 24 hrs:   BP Temp Pulse Resp SpO2   04/27/14 0907 101/58 mmHg - 95 16 96 %   04/27/14 0731 117/69 mmHg - 98 - 100 %   04/27/14 0610 115/61 mmHg 99.8 F (37.7 C) 112 18 100 %   04/27/14 0510 126/67 mmHg - 126 18 100 %   04/27/14 0328 179/87 mmHg 101.6 F (38.7 C) 144 18 100 %   04/27/14 0212 145/81 mmHg 101.4 F (38.6 C) 119 18 100 %     There is no weight on file to calculate BMI.  No intake or output data in the 24 hours ending 04/27/14 0944    General: awake, alert, oriented x 3; spanish speaking, appears uncomfortable, vomits after sits up   HEENT: perrla, eomi, sclera anicteric  oropharynx clear without lesions, mucous membranes dry   Neck: supple, no lymphadenopathy, no thyromegaly, no JVD, no carotid bruits  Cardiovascular: tachycardic and regular rhythm, no murmurs, rubs or gallops  Lungs: clear to auscultation bilaterally, without wheezing, rhonchi, or rales  Abdomen: soft, diffuse tenderness, non-distended; no palpable masses, no hepatosplenomegaly, normoactive bowel sounds, no rebound or guarding  Extremities: no clubbing, cyanosis, or edema  Neuro: cranial nerves grossly intact, strength 5/5 in upper and lower extremities, sensation intact,   Skin: no rashes or lesions noted  Back: + costovertebral angle tenderness       Labs:     Results    Procedure Component Value Units Date/Time    Blood culture Aerobic & Anaerobic #1 [[id number]] Collected:  04/27/14 0743    Specimen Information:  Blood / Blood Updated:  04/27/14 0924    Narrative:      1 BLUE+1 PURPLE    Blood culture Aerobic & Anaerobic #2 [[id number]] Collected:  04/27/14 7829    Specimen Information:  Blood / Blood Updated:  04/27/14 0924    Narrative:      1 BLUE+1 PURPLE    PT/APTT [[id number]]  (Abnormal)  Collected:  04/27/14 0714     PT 14.8 sec Updated:  04/27/14 0807     PT INR 1.2 (H)      PT Anticoag. Given Within 48 hrs. None      PTT 31 sec     Basic Metabolic Panel [[id number]]  (Abnormal) Collected:  04/27/14 0657    Specimen Information:  Blood Updated:  04/27/14 0728     Glucose 278 (H) mg/dL      BUN 4.0 (L) mg/dL      Creatinine 0.6 mg/dL      CALCIUM 7.4 (L) mg/dL      Sodium 846 (L) mEq/L      Potassium 2.9 (L) mEq/L      Chloride 101 mEq/L      CO2 17 (L) mEq/L     GFR [[id number]] Collected:  04/27/14 0657     EGFR >60.0 Updated:  04/27/14 0728    Glucose Whole Blood - POCT [[id number]]  (Abnormal) Collected:  04/27/14 0646     POCT - Glucose Whole blood 253 (H) mg/dL Updated:  25/36/64 4034    Urine culture [[id number]] Collected:  04/27/14 0242    Specimen Information:  Urine / Urine, Clean Catch Updated:  04/27/14 0624    Glucose Whole Blood - POCT [[id number]]  (Abnormal) Collected:  04/27/14 0509     POCT - Glucose Whole blood 207 (H) mg/dL Updated:  18/84/16 6063    CBC and differential [[id number]]  (Abnormal) Collected:  04/27/14 0242    Specimen Information:  Blood / Blood Updated:  04/27/14 0338     WBC 10.69 x10 3/uL      RBC 4.34 x10 6/uL      Hgb 12.0 g/dL      Hematocrit 35.5 (L) %      MCV 82.5 fL      MCH 27.6 (L) pg      MCHC 33.5 g/dL      RDW 13 %      Platelets 295 x10 3/uL      MPV 10.1 fL      Neutrophils 80 %      Lymphocytes Automated 14 %      Monocytes 5 %      Eosinophils Automated 0 %      Basophils Automated 0 %      Immature Granulocyte 0 %      Nucleated RBC 0 /100 WBC      Neutrophils Absolute 8.54 (H) x10 3/uL      Abs Lymph Automated 1.44 x10 3/uL      Abs Mono Automated 0.57 x10 3/uL      Abs Eos Automated 0.04 x10 3/uL      Absolute Baso Automated 0.03 x10 3/uL      Absolute Immature Granulocyte 0.02 x10 3/uL     Urinalysis with microscopic [[id number]]  (Abnormal) Collected:  04/27/14 0242    Specimen Information:  Urine Updated:  04/27/14 0328     Urine Type Clean Catch       Color, UA Yellow      Clarity, UA Clear      Specific Gravity UA 1.018      Urine pH 6.5      Leukocyte Esterase, UA Large (A)      Nitrite, UA Positive (A)      Protein, UR 30 (A)  Glucose, UA >1000 (A)      Ketones UA >=80 (A)      Urobilinogen, UA Normal mg/dL      Bilirubin, UA Negative      Blood, UA Negative      RBC, UA 0 - 5 /hpf      WBC, UA 11 - 25 (A) /hpf      Squamous Epithelial Cells, Urine 0 - 5 /hpf     i-Stat CG4 Venous CartrIDge [[id number]] Collected:  04/27/14 0311     i-STAT pH Venous 7.555 Updated:  04/27/14 0317     i-STAT pCO2 Venous 21.5      i-STAT pO2 Venous 39.0      i-STAT HCO3 Bicarbonate Venous 18.8 mEq/L      i-STAT Total CO2 Venous 19.0 mEq/L      i-STAT Base Excess Venous -1.0 mEq/L      i-STAT O2 Saturation Venous 79.0 %      i-STAT Lactic acid 1.8 mEq/L      i-STAT Patient Temperature 101.4      i-STAT FIO2 2      i-STAT Allen's Test NA      i-STAT Draw Site Venous     Basic Metabolic Panel [[id number]]  (Abnormal) Collected:  04/27/14 0242    Specimen Information:  Blood Updated:  04/27/14 0313     Glucose 418 (H) mg/dL      BUN 8.0 mg/dL      Creatinine 0.7 mg/dL      CALCIUM 8.6 mg/dL      Sodium 696 (L) mEq/L      Potassium 3.4 (L) mEq/L      Chloride 97 (L) mEq/L      CO2 15 (L) mEq/L     Hepatic function panel (LFT) [[id number]]  (Abnormal) Collected:  04/27/14 0242    Specimen Information:  Blood Updated:  04/27/14 0313     Bilirubin, Total 0.5 mg/dL      Bilirubin, Direct 0.2 mg/dL      Bilirubin, Indirect 0.3 mg/dL      AST (SGOT) 19 U/L      ALT 13 U/L      Alkaline Phosphatase 172 (H) U/L      Protein, Total 6.9 g/dL      Albumin 3.2 (L) g/dL      Globulin 3.7 (H) g/dL      Albumin/Globulin Ratio 0.9     GFR [[id number]] Collected:  04/27/14 0242     EGFR >60.0 Updated:  04/27/14 3664          Imaging personally reviewed, including:  CT Abd/Pelvis: acute pyelonephritis of both kidneys   Chest Xray: no acute cardiopulm process     Safety Checklist  DVT  prophylaxis:  CHEST guideline (See page [id number]) Chemical   Foley:   Rn Foley protocol Not present   IVs:  Peripheral IV   PT/OT: Not needed   Daily CBC & or Chem ordered:  SHM/ABIM guidelines (see #5) Yes, due to clinical and lab instability   Reference for approximate charges of common labs: CBC auto diff - $76  BMP - $99  Mg - $79    Signed by: Christene Slates, MD   cc:Pcp, Noneorunknown, MD (General)      Attending Attestation:     I have seen and personally examined the patient.  I agree with the findings and exam as documented by Dr. Loleta Chance.    Plan:  1.  Diabetic ketoacidosis secondary to pyelonephritis.  We will continue  with insulin drip, monitor BMP q.6 h. for gap closure.  Currently n.p.o.   Monitor electrolytes closely and supplement as needed.  Continue with  hydration.  2.  Pyelonephritis.  Urine culture is pending.  We will continue with  Rocephin 1 gram q.24 h., and based on the sensitivity and identification of  the organism antibiotic regimen would be deescalated.  3.  Hyponatremia likely secondary to hyperglycemia.  We will continue to  monitor.  4.  Diabetes.  The patient is on metformin.  Current presentation with  diabetic ketoacidosis, likely secondary to noncompliance.  We will check  hemoglobin A1c.  The patient will benefit from endocrinology evaluation and  follow up as an outpatient.  5.  Case discussed with the patient with the help of a Engineer, structural.   All questions and concerns answered.        Disposition:     Today's date: 04/27/2014  Admit Date: 04/22/2014  7:45 PM  Anticipated medical stability for discharge:Red - not tomorrow - estimated month/date: 04-30-2014  Service status: Inpatient: risk of morbidity and mortality, risk of progressive disease and risk of readmission  Reason for ongoing hospitalization: DKA  Anticipated discharge needs: tbd     Lucilla Edin, MD

## 2014-04-27 NOTE — ED Notes (Signed)
Bed: N 42  Expected date:   Expected time:   Means of arrival: [id number] [place]  Comments:

## 2014-04-27 NOTE — Progress Notes (Signed)
Pt admitted from ER around 1200. Insulin gtt continued (started in ER) per DKA protocol, stopped around 1630. Lantus 15 units started and SSI. Electrolytes replaced. Drowsy but neurologically intact, C/O generalized pain, tx with morphine 1mg  q4 with good effect. Currently on RA without desats. HR tachy in 100s, soft SBP in 90-100s. Pt is [facility].

## 2014-04-27 NOTE — ED Provider Notes (Signed)
Physician/Midlevel provider first contact with patient: 04/27/14 0222           EMERGENCY DEPARTMENT HISTORY AND PHYSICAL EXAM    Date: 04/27/2014  Patient Name: Kelsey Miles, Kelsey Miles      Disposition and Treatment Plan    Clinical Impression:   1. UTI (lower urinary tract infection)    2. Diabetic ketoacidosis without coma associated with diabetes mellitus    3. Pyelonephritis      Disposition:   ED Disposition    Admit Admitting Physician: Lucilla Edin [[id number]]  Diagnosis: Pyelonephritis [161096]  Estimated Length of Stay: > or = to 2 midnights  Tentative Discharge Plan?: Home or Self Care [1]  Patient Class: Inpatient [101]  Update Service: Medicine [106]  I certify that inpatient services are medically necessary for this patient. Please see H&P and MD progress notes for additional information about the patient's course of treatment. For Medicare patients, services provided in accordance with 412.3 and expected LOS to be greater than 2 midnights for Medicare patients.: Yes               History of Presenting Illness     Chief Complaint   Patient presents with   . Abdominal Pain     [facility] Kelsey Miles is a 33 y.o. female h/o DM, gout and c-section; complains of sharp abd pain. Pt claims that oral medication helps but pain got worse today. Pt denies N/V/D. No other complaints     PCP: Pcp, Noneorunknown, MD (General)    Current Medications     Current facility-administered medications: 0.9 % NaCl with KCl 20 mEq infusion, , Intravenous, Continuous, Hill, Tennis Must, MD, Last Rate: 125 mL/hr at 04/27/14 2000;  [START ON 04/28/2014] cefTRIAXone (ROCEPHIN) 1 g in sodium chloride 0.9 % 50 mL IVPB mini-bag plus, 1 g, Intravenous, Q24H SCH, Hill, Tennis Must, MD;  dextrose (GLUCOSE) 40 % oral gel 15 g of glucose, 15 g of glucose, Oral, PRN, Christene Slates, MD  dextrose 50 % bolus 25 mL, 25 mL, Intravenous, PRN, Christene Slates, MD;  glucagon (rDNA) (GLUCAGEN) injection 1 mg, 1 mg, Intramuscular, PRN, Christene Slates,  MD;  heparin (porcine) injection 5,000 Units, 5,000 Units, Subcutaneous, Q8H SCH, Christene Slates, MD, 5,000 Units at 04/27/14 2152;  insulin aspart (NovoLOG) injection 1-8 Units, 1-8 Units, Subcutaneous, PRN, Christene Slates, MD  insulin glargine (LANTUS) injection 15 Units, 15 Units, Subcutaneous, QHS, Christene Slates, MD, 15 Units at 04/27/14 2152;  morphine injection 1 mg, 1 mg, Intravenous, Q4H PRN, Christene Slates, MD, 1 mg at 04/27/14 1741    Past Medical History     Past Medical History   Diagnosis Date   . Diabetes mellitus associated with hormonal etiology 2007   . Gout      Past Surgical History   Procedure Laterality Date   . Cesarean section         Family History     Family History   Problem Relation Age of Onset   . Diabetes Father    . Diabetes Brother    . Diabetes Sister        Social History     History     Social History   . Marital Status: Significant Other     Spouse Name: N/A     Number of Children: N/A   . Years of Education: N/A     Social History Main Topics   . Smoking status: Never Smoker    .  Smokeless tobacco: Not on file   . Alcohol Use: No   . Drug Use: Not on file   . Sexual Activity:     Partners: Male     Other Topics Concern   . Not on file     Social History Narrative       Allergies     No Known Allergies    Review of Systems     Positive and negative ROS elements as per HPI.  All Other Systems Reviewed and Negative: Yes    Physical Exam   BP 122/76 mmHg  Pulse 114  Temp(Src) 99.4 F (37.4 C) (Oral)  Resp 26  Wt 56.1 kg  SpO2 98%  Constitutional: Vital signs reviewed. Well appearing.  Head: Normocephalic, atraumatic  Eyes: No conjunctival injection. No discharge.  ENT: Mucous membranes moist  Neck: Normal range of motion. Non-tender.  Respiratory/Chest: Clear to auscultation. No respiratory distress.   Cardiovascular: Regular rate and rhythm. No murmur.   Abdomen: Soft and non-tender. No guarding. No masses or hepatosplenomegaly.  Genitourinary:    UpperExtremity:  LowerExtremity: No edema. No cyanosis.  Neurological: No focal motor deficits by observation. Speech normal. Memory normal.  Skin: Warm and dry. No rash.  Lymphatic: No cervical lymphadenopathy.  Psychiatric: Normal affect. Normal concentration.    Diagnostic Study Results   Labs -     Results    Procedure Component Value Units Date/Time    Phosphorus (Q4H until Anion Gap less than 12 then change  to Q12H) [[id number]] Collected:  04/27/14 2155    Specimen Information:  Blood Updated:  04/27/14 2156    Basic Metabolic Panel [[id number]] Collected:  04/27/14 2155    Specimen Information:  Blood Updated:  04/27/14 2156    Magnesium (Q4H until Anion Gap less than 12 then change  to Q12H) [[id number]] Collected:  04/27/14 2155    Specimen Information:  Blood Updated:  04/27/14 2156    Glucose Whole Blood - POCT [[id number]]  (Abnormal) Collected:  04/27/14 1732     POCT - Glucose Whole blood 173 (H) mg/dL Updated:  62/95/28 4132    Basic Metabolic Panel (every 4 hours until anion gap is less than 12, then evey 12 hours) [[id number]]  (Abnormal) Collected:  04/27/14 1623    Specimen Information:  Blood Updated:  04/27/14 1654     Glucose 178 (H) mg/dL      BUN 4.0 (L) mg/dL      Creatinine 0.5 (L) mg/dL      CALCIUM 6.8 (L) mg/dL      Sodium 366 mEq/L      Potassium 3.4 (L) mEq/L      Chloride 110 mEq/L      CO2 20 (L) mEq/L     Magnesium (Q4H until Anion Gap less than 12 then change  to Q12H) [[id number]] Collected:  04/27/14 1623    Specimen Information:  Blood Updated:  04/27/14 1654     Magnesium 1.9 mg/dL     Phosphorus (Z5G until Anion Gap less than 12 then change  to Q12H) [[id number]]  (Abnormal) Collected:  04/27/14 1623    Specimen Information:  Blood Updated:  04/27/14 1654     Phosphorus 1.8 (L) mg/dL     GFR [[id number]] Collected:  04/27/14 1623     EGFR >60.0 Updated:  04/27/14 1654    Glucose Whole Blood - POCT [[id number]]  (Abnormal) Collected:  04/27/14 1542     POCT - Glucose Whole blood 189  (H) mg/dL Updated:  04/27/14 1544    MRSA culture [[id number]] Collected:  04/27/14 1239    Specimen Information:  Body Fluid / Nares and Throat Updated:  04/27/14 1513    Hemoglobin A1C [[id number]]  (Abnormal) Collected:  04/27/14 1230    Specimen Information:  Blood Updated:  04/27/14 1508     Hemoglobin A1C 14.8 (H) %     Glucose Whole Blood - POCT [[id number]]  (Abnormal) Collected:  04/27/14 1429     POCT - Glucose Whole blood 182 (H) mg/dL Updated:  08/65/78 4696    Glucose Whole Blood - POCT [[id number]]  (Abnormal) Collected:  04/27/14 1330     POCT - Glucose Whole blood 161 (H) mg/dL Updated:  44/01/02 7253    Basic Metabolic Panel (every 4 hours until anion gap is less than 12, then evey 12 hours) [[id number]]  (Abnormal) Collected:  04/27/14 1230    Specimen Information:  Blood Updated:  04/27/14 1313     Glucose 253 (H) mg/dL      BUN 5.0 (L) mg/dL      Creatinine 0.6 mg/dL      CALCIUM 7.0 (L) mg/dL      Sodium 259 (L) mEq/L      Potassium 3.3 (L) mEq/L      Chloride 107 mEq/L      CO2 19 (L) mEq/L     Magnesium (Q4H until Anion Gap less than 12 then change  to Q12H) [[id number]]  (Abnormal) Collected:  04/27/14 1230    Specimen Information:  Blood Updated:  04/27/14 1313     Magnesium 1.4 (L) mg/dL     Phosphorus (P2R until Anion Gap less than 12 then change  to Q12H) [[id number]]  (Abnormal) Collected:  04/27/14 1230    Specimen Information:  Blood Updated:  04/27/14 1313     Phosphorus 1.9 (L) mg/dL     GFR [[id number]] Collected:  04/27/14 1230     EGFR >60.0 Updated:  04/27/14 1313    Glucose Whole Blood - POCT [[id number]]  (Abnormal) Collected:  04/27/14 1239     POCT - Glucose Whole blood 249 (H) mg/dL Updated:  02/54/27 0623    Glucose Whole Blood - POCT [[id number]]  (Abnormal) Collected:  04/27/14 1142     POCT - Glucose Whole blood 251 (H) mg/dL Updated:  61/60/73 7106    Glucose Whole Blood - POCT [[id number]]  (Abnormal) Collected:  04/27/14 1047     POCT - Glucose Whole blood 328 (H) mg/dL Updated:   70/35/00 9381    Blood culture Aerobic & Anaerobic #1 [[id number]] Collected:  04/27/14 0743    Specimen Information:  Blood / Blood Updated:  04/27/14 0924    Narrative:      1 BLUE+1 PURPLE    Blood culture Aerobic & Anaerobic #2 [[id number]] Collected:  04/27/14 0722    Specimen Information:  Blood / Blood Updated:  04/27/14 0924    Narrative:      1 BLUE+1 PURPLE    PT/APTT [[id number]]  (Abnormal) Collected:  04/27/14 0714     PT 14.8 sec Updated:  04/27/14 0807     PT INR 1.2 (H)      PT Anticoag. Given Within 48 hrs. None      PTT 31 sec     Basic Metabolic Panel [[id number]]  (Abnormal) Collected:  04/27/14 0657    Specimen Information:  Blood Updated:  04/27/14 0728     Glucose 278 (H) mg/dL      BUN 4.0 (L) mg/dL  Creatinine 0.6 mg/dL      CALCIUM 7.4 (L) mg/dL      Sodium 161 (L) mEq/L      Potassium 2.9 (L) mEq/L      Chloride 101 mEq/L      CO2 17 (L) mEq/L     GFR [[id number]] Collected:  04/27/14 0657     EGFR >60.0 Updated:  04/27/14 0728    Glucose Whole Blood - POCT [[id number]]  (Abnormal) Collected:  04/27/14 0646     POCT - Glucose Whole blood 253 (H) mg/dL Updated:  95/62/13 0865    Urine culture [[id number]] Collected:  04/27/14 0242    Specimen Information:  Urine / Urine, Clean Catch Updated:  04/27/14 0624    Glucose Whole Blood - POCT [[id number]]  (Abnormal) Collected:  04/27/14 0509     POCT - Glucose Whole blood 207 (H) mg/dL Updated:  08/06/24 3664    CBC and differential [[id number]]  (Abnormal) Collected:  04/27/14 0242    Specimen Information:  Blood / Blood Updated:  04/27/14 0338     WBC 10.69 x10 3/uL      RBC 4.34 x10 6/uL      Hgb 12.0 g/dL      Hematocrit 56.3 (L) %      MCV 82.5 fL      MCH 27.6 (L) pg      MCHC 33.5 g/dL      RDW 13 %      Platelets 295 x10 3/uL      MPV 10.1 fL      Neutrophils 80 %      Lymphocytes Automated 14 %      Monocytes 5 %      Eosinophils Automated 0 %      Basophils Automated 0 %      Immature Granulocyte 0 %      Nucleated RBC 0 /100 WBC       Neutrophils Absolute 8.54 (H) x10 3/uL      Abs Lymph Automated 1.44 x10 3/uL      Abs Mono Automated 0.57 x10 3/uL      Abs Eos Automated 0.04 x10 3/uL      Absolute Baso Automated 0.03 x10 3/uL      Absolute Immature Granulocyte 0.02 x10 3/uL     Urinalysis with microscopic [[id number]]  (Abnormal) Collected:  04/27/14 0242    Specimen Information:  Urine Updated:  04/27/14 0328     Urine Type Clean Catch      Color, UA Yellow      Clarity, UA Clear      Specific Gravity UA 1.018      Urine pH 6.5      Leukocyte Esterase, UA Large (A)      Nitrite, UA Positive (A)      Protein, UR 30 (A)      Glucose, UA >1000 (A)      Ketones UA >=80 (A)      Urobilinogen, UA Normal mg/dL      Bilirubin, UA Negative      Blood, UA Negative      RBC, UA 0 - 5 /hpf      WBC, UA 11 - 25 (A) /hpf      Squamous Epithelial Cells, Urine 0 - 5 /hpf     i-Stat CG4 Venous CartrIDge [[id number]] Collected:  04/27/14 0311     i-STAT pH Venous 7.555 Updated:  04/27/14 0317     i-STAT pCO2 Venous 21.5  i-STAT pO2 Venous 39.0      i-STAT HCO3 Bicarbonate Venous 18.8 mEq/L      i-STAT Total CO2 Venous 19.0 mEq/L      i-STAT Base Excess Venous -1.0 mEq/L      i-STAT O2 Saturation Venous 79.0 %      i-STAT Lactic acid 1.8 mEq/L      i-STAT Patient Temperature 101.4      i-STAT FIO2 2      i-STAT Allen's Test NA      i-STAT Draw Site Venous     Basic Metabolic Panel [[id number]]  (Abnormal) Collected:  04/27/14 0242    Specimen Information:  Blood Updated:  04/27/14 0313     Glucose 418 (H) mg/dL      BUN 8.0 mg/dL      Creatinine 0.7 mg/dL      CALCIUM 8.6 mg/dL      Sodium 010 (L) mEq/L      Potassium 3.4 (L) mEq/L      Chloride 97 (L) mEq/L      CO2 15 (L) mEq/L     Hepatic function panel (LFT) [[id number]]  (Abnormal) Collected:  04/27/14 0242    Specimen Information:  Blood Updated:  04/27/14 0313     Bilirubin, Total 0.5 mg/dL      Bilirubin, Direct 0.2 mg/dL      Bilirubin, Indirect 0.3 mg/dL      AST (SGOT) 19 U/L      ALT 13 U/L       Alkaline Phosphatase 172 (H) U/L      Protein, Total 6.9 g/dL      Albumin 3.2 (L) g/dL      Globulin 3.7 (H) g/dL      Albumin/Globulin Ratio 0.9     GFR [[id number]] Collected:  04/27/14 0242     EGFR >60.0 Updated:  04/27/14 6387        Radiologic Studies -   Radiology Results (24 Hour)    Procedure Component Value Units Date/Time    CT Abd/Pelvis with IV and PO Contrast [[id number]] Collected:  04/27/14 0546    Order Status:  Completed Updated:  04/27/14 0556    Narrative:      TECHNIQUE: CT abdomen and pelvis WITH contrast. 100 mL IV Omnipaque 350  was administered. Oral contrast was administered.    INDICATION: Abdominal pain    COMPARISON: 07/01/2011    FINDINGS:     LINES/TUBES: None.    LOWER THORAX:  Unremarkable.    LIVER/BILIARY TREE:  No mass or intrahepatic biliary dilatation. No  gallbladder distension or calcified gallstones.  SPLEEN: No splenomegaly.  PANCREAS:  No pancreatic mass or duct dilatation.    KIDNEY/URETERS: Heterogeneous enhancement of both kidneys with adjacent  infiltrates stranding consistent with acute pyelonephritis. No stones or  hydronephrosis of either kidney.  ADRENALS:  No adrenal mass.  PELVIC ORGANS/BLADDER: No pelvic masses.    PERITONEUM/RETROPERITONEUM: No free air or fluid.  LYMPH NODES: No lymphadenopathy.  VESSELS: No aortic aneurysm.    GI TRACT:  No bowel wall thickening or dilation. Normal appendix.    BONES AND SOFT TISSUES:  Unremarkable.      Impression:        Acute pyelonephritis.    Johnsie Kindred, MD   04/27/2014 5:52 AM      Chest AP Portable [[id number]] Collected:  04/27/14 0254    Order Status:  Completed Updated:  04/27/14 0258    Narrative:      HISTORY:  Sepsis workup    Portable AP view of the chest shows clear lungs.  Cardiomediastinal  silhouette is not enlarged.  No focal bony lesion is seen.      Impression:          No acute cardiopulmonary process    J. Carole Binning, MD   04/27/2014 2:54 AM        .    Clinical Course in the Emergency Department          Medical Decision Making     I am the first provider for this patient.  I reviewed the vital signs, nursing notes, past medical history, past surgical history, family history and social history.      Vital Signs -   Patient Vitals for the past 12 hrs:   BP Temp Pulse Resp   04/27/14 2000 122/76 mmHg 99.4 F (37.4 C) 114 26   04/27/14 1800 131/56 mmHg - 111 24   04/27/14 1700 97/62 mmHg - 89 16   04/27/14 1600 104/63 mmHg 98 F (36.7 C) 88 14   04/27/14 1500 - - 90 22   04/27/14 1400 97/57 mmHg - 89 23   04/27/14 1300 - - 93 20   04/27/14 1215 125/52 mmHg 97.8 F (36.6 C) 98 15   04/27/14 1137 99/62 mmHg - 96 -   04/27/14 1047 103/59 mmHg - 100 16       Pulse Oximetry Analysis - Normal    Differential Diagnosis (not completely inclusive): DKA, pyelo     Laboratory results reviewed by EDP: yes  Radiologic study results reviewed by EDP: yes  Radiologic Studies Interpreted (viewed) by EDP: yes    NO SCRIBE      Pat Patrick, MD  04/27/14 2220

## 2014-04-27 NOTE — Plan of Care (Signed)
PT's BG remains elevated, corrected on sliding scale per MD. Electrolytes replaced, still on Q4H BMP, anion gap reported to Dr. Conni Elliot, will still keep on Q4H protocol for the morning. Pt's BP WNL, HR up to 120-130s on exertion, will monitor per Order.

## 2014-04-27 NOTE — ED Notes (Signed)
Multiple attempts made by staff to obtain blood specimen for culture/pt/aptt, all attempts were unsuccessful. Dr. Karleen Hampshire aware.

## 2014-04-27 NOTE — ED Notes (Addendum)
Pt c/o diffused abdominal pain/fever, denies n/v/d/uti symptoms

## 2014-04-28 LAB — CBC
Hematocrit: 32.4 % — ABNORMAL LOW (ref 37.0–47.0)
Hgb: 10.7 g/dL — ABNORMAL LOW (ref 12.0–16.0)
MCH: 27.4 pg — ABNORMAL LOW (ref 28.0–32.0)
MCHC: 33 g/dL (ref 32.0–36.0)
MCV: 83.1 fL (ref 80.0–100.0)
MPV: 9.6 fL (ref 9.4–12.3)
Nucleated RBC: 0 /100 WBC (ref 0–1)
Platelets: 281 10*3/uL (ref 140–400)
RBC: 3.9 10*6/uL — ABNORMAL LOW (ref 4.20–5.40)
RDW: 13 % (ref 12–15)
WBC: 11.88 10*3/uL — ABNORMAL HIGH (ref 3.50–10.80)

## 2014-04-28 LAB — BASIC METABOLIC PANEL
BUN: 3 mg/dL — ABNORMAL LOW (ref 7.0–19.0)
BUN: 5 mg/dL — ABNORMAL LOW (ref 7.0–19.0)
CO2: 20 mEq/L — ABNORMAL LOW (ref 22–29)
CO2: 20 mEq/L — ABNORMAL LOW (ref 22–29)
Calcium: 7.5 mg/dL — ABNORMAL LOW (ref 8.5–10.5)
Calcium: 8.3 mg/dL — ABNORMAL LOW (ref 8.5–10.5)
Chloride: 109 mEq/L (ref 100–111)
Chloride: 107 mEq/L (ref 100–111)
Creatinine: 0.5 mg/dL — ABNORMAL LOW (ref 0.6–1.0)
Creatinine: 0.6 mg/dL (ref 0.6–1.0)
Glucose: 119 mg/dL — ABNORMAL HIGH (ref 70–100)
Glucose: 227 mg/dL — ABNORMAL HIGH (ref 70–100)
Potassium: 3.9 mEq/L (ref 3.5–5.1)
Potassium: 4.5 mEq/L (ref 3.5–5.1)
Sodium: 137 mEq/L (ref 136–145)
Sodium: 136 mEq/L (ref 136–145)

## 2014-04-28 LAB — PHOSPHORUS
Phosphorus: 2.2 mg/dL — ABNORMAL LOW (ref 2.3–4.7)
Phosphorus: 3.8 mg/dL (ref 2.3–4.7)

## 2014-04-28 LAB — GLUCOSE WHOLE BLOOD - POCT
Whole Blood Glucose POCT: 130 mg/dL — ABNORMAL HIGH (ref 70–100)
Whole Blood Glucose POCT: 123 mg/dL — ABNORMAL HIGH (ref 70–100)
Whole Blood Glucose POCT: 218 mg/dL — ABNORMAL HIGH (ref 70–100)
Whole Blood Glucose POCT: 195 mg/dL — ABNORMAL HIGH (ref 70–100)
Whole Blood Glucose POCT: 202 mg/dL — ABNORMAL HIGH (ref 70–100)
Whole Blood Glucose POCT: 285 mg/dL — ABNORMAL HIGH (ref 70–100)

## 2014-04-28 LAB — GFR
EGFR: >60
EGFR: >60

## 2014-04-28 LAB — MAGNESIUM
Magnesium: 2 mg/dL (ref 1.6–2.6)
Magnesium: 1.8 mg/dL (ref 1.6–2.6)

## 2014-04-28 LAB — HEMOLYSIS INDEX: Hemolysis Index: 10 (ref 0–18)

## 2014-04-28 LAB — VITAMIN D,25 OH,TOTAL: Vitamin D, 25 OH, Total: 13 ng/mL — ABNORMAL LOW (ref 30–100)

## 2014-04-28 MED ORDER — ACETAMINOPHEN 325 MG PO TABS
325.0000 mg | ORAL_TABLET | ORAL | Status: DC | PRN
Start: 2014-04-28 — End: 2014-04-29
  Administered 2014-04-28: 325 mg via ORAL
  Filled 2014-04-28: qty 2

## 2014-04-28 MED ORDER — POTASSIUM CHLORIDE CRYS ER 20 MEQ PO TBCR
40.0000 meq | EXTENDED_RELEASE_TABLET | Freq: Once | ORAL | Status: AC
Start: 2014-04-28 — End: 2014-04-28
  Administered 2014-04-28: 40 meq via ORAL
  Filled 2014-04-28: qty 2

## 2014-04-28 MED ORDER — HYDROMORPHONE HCL PF 1 MG/ML IJ SOLN
1.0000 mg | Freq: Once | INTRAMUSCULAR | Status: DC
Start: 2014-04-28 — End: 2014-04-28

## 2014-04-28 MED ORDER — K PHOS NEUTRAL 250 MG PO TABS
500.0000 mg | ORAL_TABLET | Freq: Four times a day (QID) | ORAL | Status: AC
Start: 2014-04-28 — End: 2014-04-28
  Administered 2014-04-28 (×2): 2 via ORAL
  Filled 2014-04-28 (×2): qty 2

## 2014-04-28 NOTE — Student Progress (Signed)
MEDICAL STUDENT NOTE    Patient's Name: Kelsey Miles    Room:  WN027/OZ366-44  Attending Provider: Lucilla Edin, MD  Admit Date:04/27/2014  Medical Record Number: [id number]   Date/Time: 04/28/2014 11:14 AM  Pager: [phone]      Brief History  33 y/o spanish speaking female with hx of DM2 and gout who p/w to ER last night with 5 day history of back and abdominal pain, found to have DKA 2/2 pyelonephritis.     24 hour events:     Reports 10/10 back pain that radiates around her R flank. No other pain. Tolerating PO intake. No n/v/f/c.     Patient Active Problem List   Diagnosis   . 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 Previous cesarean section   . Supervision of other high-risk pregnancy   . Pyelonephritis   . UTI (lower urinary tract infection)          Assessment/Plan:     #DKA 2/2 pyelonephritis Gap 15 -> 13 in ED. UA on 7/18: positive for large LE, nitrites, 30 protein, >1000 glucose, >80 urobilinogen, and 11-25 WBCs  - Insulin gtt d/c'ed around 1630 as gap closed (gap of 8 this AM)  - On NS w/ K at 166ml/hr this AM, will d/c as gap closed and tolerating PO    #Pyelonephritis  - Urine Cx from 7/18 revealed >100,000 CFU/ML Gram negative rod    - Continue Ceftriaxone 1gm q24 hrs (day #2)  - F/u urine culture identification and sensitivity and will adjust Abx as needed  - Blood Cx- NG after 1 day    #Electrolytes  -Hyponatremia likely 2/2 hyperglycemia- resolved, Na 137 this AM  -Hypophosphatemia- 2.2 this AM, improving (1.9@admission )--> will replete with phosphorus tablets 500mg  q6hrs and recheck tomorrow AM  -Hypocalcemia- corrected calcium this AM 8.1--> will replete with calcium carbonate 500mg  BID and check vitamin D level  - Potassium replacement 2/2 recent DKA, K=3.9 this AM will replete with KCl    # DM2 with noncompliance  - Random glucose ranging 119-253, POCT 130-225  - Currently on 15 lantus (received 30 lantus yesterday + 3 units  SSI)  - Hgb A1C 14.8 on 7/18, previously 6.0 on 03/2011 and 6.7 on 02/2011  - Previously on metformin which she obtains from mother in [place], does not check glucose (has no PCP)  - Will need diabetic education and plan to contact social work for help with access to primary care (pt does not have health insurance of PCP)    DVT ppx: HSQ  Diet: consistent carb  IVs/Foley: PIVs  Contact precautions: none  Code Status: FULL  Dispo: pending urine culture and sensitivity results and switch to PO abx      Physical Exam:       VITAL SIGNS PHYSICAL EXAM   Temp:  [97.8 F (36.6 C)-99.7 F (37.6 C)] 99 F (37.2 C)  Heart Rate:  [88-114] 99  Resp Rate:  [14-26] 19  BP: (92-134)/(52-76) 120/62 mmHg  Blood Glucose:  Pulse ox:  Telemetry:       Intake/Output Summary (Last 24 hours) at 04/28/14 1114  Last data filed at 04/28/14 1000   Gross per 24 hour   Intake 4049.64 ml   Output   2700 ml   Net 1349.64 ml    Physical Exam  Neuro:  A&Ox3, no acute distress  HEENT: nontraumatic, PERLA, no scleral icterus  Cardiac: RRR, no  murmurs, rubs, or gallops  Lungs: CTAB, no wheezes, rhonchi, rales  Abdomen: soft, NT, ND, bowel sounds present;  (+) R CVA tenderness radiating over R flank   Ext: no edema  Skin: no rashes        Scheduled Meds: PRN Meds:        cefTRIAXone 1 g Intravenous Q24H SCH   heparin (porcine) 5,000 Units Subcutaneous Q8H SCH   insulin glargine 15 Units Subcutaneous QHS   phosphorus 500 mg Oral Q6H       Continuous Infusions:  . 0.9 % NaCl with KCl 20 mEq 125 mL/hr at 04/28/14 1100      acetaminophen 325 mg Q4H PRN   dextrose 15 g of glucose PRN   dextrose 25 mL PRN   glucagon (rDNA) 1 mg PRN   insulin aspart 1-8 Units PRN   morphine 1 mg Q4H PRN           Labs (last 72 hours):  Recent Labs      04/28/14   0359  04/27/14   0242   WBC  11.88*  10.69   HEMOGLOBIN  10.7*  12.0   HEMATOCRIT  32.4*  35.8*     Recent Labs      04/27/14   0714   PT  14.8   PT INR  1.2*   PTT  31    Recent Labs      04/28/14   0359   04/27/14   2155  04/27/14   1623   SODIUM  137  137  137   POTASSIUM  3.9  4.2  3.4*   CHLORIDE  109  107  110   CO2  20*  19*  20*   BUN  3.0*  5.0*  4.0*   CREATININE  0.5*  0.6  0.5*   GLUCOSE  119*  208*  178*   CALCIUM  7.5*  7.4*  6.8*   MAGNESIUM  2.0  1.7  1.9   PHOSPHORUS  2.2*  3.1  1.8*                 Microbiology:   UA on 7/18: positive for large LE, nitrites, 30 protein, >1000 glucose, >80 urobilinogen, and 11-25 WBCs  Urine culture- >100,000 CFU/ML Gram negative rod    Blood culture- NG at 1 day    Imaging:  7/18 CT Abd/pelvis- Heterogeneous enhancement of both kidneys with adjacent  infiltrates stranding consistent with acute pyelonephritis. No stones or  hydronephrosis of either kidney.    Procedures:  n/a    Active PICC Line / CVC Line / PIV Line / Drain / Airway / Intraosseous Line / Epidural Line / ART Line / Line / Wound / Pressure Ulcer / NG/OG Tube    Name:   Placement date:   Placement time:   Site:   Days:    Peripheral IV 04/27/14 Left Forearm  04/27/14   0311   Forearm   1    Peripheral IV 04/27/14 Right Forearm  04/27/14   0311   Forearm   1    Peripheral IV 04/27/14 Left Forearm  04/27/14   1250   Forearm   less than 1            Signed by: Yves Dill, Acting Intern  Date/Time: 04/28/2014 11:14 AM

## 2014-04-28 NOTE — Progress Notes (Signed)
MEDICINE PROGRESS NOTE    Date Time: 04/28/2014 7:09 PM    Patient Name: Kelsey Miles  Attending Physician: Raj Janus [name] is a 33 y.o.female with PMH significant for DM2, gout, who was admitted on 04/27/2014 for pyleonephritis & DKA.    Assessment:     Principal Problem:  Pylonephritis    Active Hospital Problems  DKA - resolved  DM  Hypokalemia - resolved    Plan:     Pylonephritis  - (7/18) urine Cx with GNRs  - continue ceftriaxone   - f/u urine Cx & sensitivities  - f/u BCx (NG@1D )    DKA - resolved  - insulin gtt d/c'd last night.  - d/c'd IVF today as gap has closed and pt able to take PO    DM (A1C 14.8 on7/18)  - on metformin at home; however does not take 2/2 access to care barriers  - continue currently regime (15 lantus + SSI) --> reassess in AM and adjust as needed.  - consult SW    Code Status: full  DVT ppx: SQH  Diet: DM diet  Contact precautions: none.       Safety Checklist:     DVT prophylaxis:  CHEST guideline (See page [id number]) Chemical   Foley: Not present   IVs:  Peripheral IV   PT/OT: Not needed   Daily CBC & or Chem ordered:  SHM/ABIM guidelines (see #5) Yes, due to clinical and lab instability   Reference for charges of common labs: CBC auto diff - $76  BMP - $99  Mg - $79    Active PICC Line / CVC Line / PIV Line / Drain / Airway / Intraosseous Line / Epidural Line / ART Line / Line / Wound / Pressure Ulcer / NG/OG Tube    Name:   Placement date:   Placement time:   Site:   Days:    Peripheral IV 04/27/14 Left Forearm  04/27/14   0311   Forearm   1    Peripheral IV 04/27/14 Left Forearm  04/27/14   1250   Forearm   1          Disposition:     Anticipated discharge needs: pending Ucx & sensitivities.  Transition to PO meds.      Subjective     CC: back pain    Interval History/24 hour events: transferred to 10th floor.  DKA resolved last night.    HPI/Subjective: spanish speaking only.  Denies pain.       Review of Systems:   Denies pain    Physical  Exam:     VITAL SIGNS PHYSICAL EXAM   Temp:  [98.1 F (36.7 C)-99.7 F (37.6 C)] 98.1 F (36.7 C)  Heart Rate:  [89-114] 89  Resp Rate:  [17-26] 17  BP: (92-134)/(55-76) 116/73 mmHg       Intake/Output Summary (Last 24 hours) at 04/28/14 1909  Last data filed at 04/28/14 1500   Gross per 24 hour   Intake   4365 ml   Output   4200 ml   Net    165 ml    Gen: WNWD  Cardiac: tachycardic.  Regular S1 & S2s.  No murmur.  Lungs: CTAB  Abdomen: BS present, soft, nT, nD, mild CVA tenderness to palpation b/l.  Ext: no edema  Neuro: A&Ox3         Meds:     Medications were reviewed.  Labs:         Recent Labs  Lab 04/28/14  0359 04/27/14  0242   WBC 11.88* 10.69   HEMOGLOBIN 10.7* 12.0   HEMATOCRIT 32.4* 35.8*   PLATELETS 281 295         Recent Labs  Lab 04/27/14  0714   PT 14.8   PT INR 1.2*   PTT 31      Recent Labs  Lab 04/28/14  1722 04/28/14  0359  04/27/14  0242   SODIUM 136 137  < > 127*   POTASSIUM 4.5 3.9  < > 3.4*   CHLORIDE 107 109  < > 97*   CO2 20* 20*  < > 15*   BUN 5.0* 3.0*  < > 8.0   CREATININE 0.6 0.5*  < > 0.7   CALCIUM 8.3* 7.5*  < > 8.6   ALBUMIN  --   --   --  3.2*   PROTEIN, TOTAL  --   --   --  6.9   BILIRUBIN, TOTAL  --   --   --  0.5   ALKALINE PHOSPHATASE  --   --   --  172*   ALT  --   --   --  13   AST (SGOT)  --   --   --  19   GLUCOSE 227* 119*  < > 418*   < > = values in this interval not displayed.       Microbiology, reviewed and are significant for:  7/18 UCx with GNRs  7/18 BCx NGTD    Imaging personally reviewed, including:   7/18 CT abd/pelvis - acute pyelonephritis  7/18 CXR - no acute changes.      Case discussed with attending.    Signed by:   Garnetta Buddy, MD  Internal Medicine, PGY-1

## 2014-04-28 NOTE — Progress Notes (Signed)
Attending Attestation:     I have seen and personally examined the patient.  I agree with the findings and exam as documented by Dr. Regino Schultze.    Plan:  1.  Diabetic ketoacidosis has resolved.    2.  Uncontrolled Diabetes.  We will continue with Accu-Chek and sliding  scale and current insulin dose.  Recommendation is to follow up with  transitional care clinic as well as endocrinology as an outpatient.    3.  Pyelonephritis.  Urine culture shows greater than 100,000 gram-negative  rods, identification and susceptibility is pending.  We will continue with  the current broad-spectrum antibiotic regimen and DS with antibiotic  regimen based on the sensitivity profile.    4.  Hyponatremia is improving and patient can be downgraded from [facility] level  of care.          Disposition:     Today's date: 04/28/2014  Admit Date: 04/17/2014  2:54 AM  Anticipated medical stability for discharge:Yellow - maybe tomorrow  Service status: Inpatient: risk of morbidity and mortality, risk of progressive disease and risk of readmission  Reason for ongoing hospitalization: pyelo  Anticipated discharge needs: tbd     Lucilla Edin, MD

## 2014-04-28 NOTE — Plan of Care (Signed)
Report called to Macario Carls, RN on [employer] 10. Pt. to new room w/ transporter via wheelchair; family and belongings accompanied pt. Pt aware of current plan to continue glucose monitoring and diabetic education prior to to discharge to home.

## 2014-04-28 NOTE — Plan of Care (Signed)
Problem: Health Promotion  Goal: Knowledge - health resources  Extent of understanding and conveyed about healthcare resources.   Intervention: Discharge planning  Patient arrived from CVICU in wheelchair around 1700. Admitted yesterday for DKA which has resolved. Also has diagnosis of pylonephritis/ UTI. IV abx. Afebrile, VSS. Denies pain currently and has no symptoms. Complained of backache while in CVICU but pain was controlled with 325mg  Tylenol. L arm IV access, saline lock. CDI. Carbohydrate/ diabetic diet. Blood sugar 202 before meal. 3 units insulin given. No telemetry. Ambulates without assistance. Self care. Spanish speaking only. Husband and 3 young children are present at bedside. Husband in non-english speaking as well. Children can help translate basic care. Patient is in no apparent stress. Education is priority. No home meds and no prior history of diabetes. Patient was taking her mother's metformin at home. CBC without diff due at 0400 daily.

## 2014-04-29 ENCOUNTER — Other Ambulatory Visit: Payer: Self-pay

## 2014-04-29 DIAGNOSIS — N12 Tubulo-interstitial nephritis, not specified as acute or chronic: Secondary | ICD-10-CM | POA: Diagnosis present

## 2014-04-29 DIAGNOSIS — E1110 Type 2 diabetes mellitus with ketoacidosis without coma: Secondary | ICD-10-CM

## 2014-04-29 DIAGNOSIS — E1010 Type 1 diabetes mellitus with ketoacidosis without coma: Secondary | ICD-10-CM

## 2014-04-29 LAB — BASIC METABOLIC PANEL
BUN: 5 mg/dL — ABNORMAL LOW (ref 7.0–19.0)
CO2: 21 mEq/L — ABNORMAL LOW (ref 22–29)
Calcium: 8.7 mg/dL (ref 8.5–10.5)
Chloride: 108 mEq/L (ref 100–111)
Creatinine: 0.6 mg/dL (ref 0.6–1.0)
Glucose: 158 mg/dL — ABNORMAL HIGH (ref 70–100)
Potassium: 4 mEq/L (ref 3.5–5.1)
Sodium: 139 mEq/L (ref 136–145)

## 2014-04-29 LAB — CBC
Hematocrit: 34.9 % — ABNORMAL LOW (ref 37.0–47.0)
Hgb: 11.6 g/dL — ABNORMAL LOW (ref 12.0–16.0)
MCH: 27.7 pg — ABNORMAL LOW (ref 28.0–32.0)
MCHC: 33.2 g/dL (ref 32.0–36.0)
MCV: 83.3 fL (ref 80.0–100.0)
MPV: 9.7 fL (ref 9.4–12.3)
Nucleated RBC: 0 /100 WBC (ref 0–1)
Platelets: 329 10*3/uL (ref 140–400)
RBC: 4.19 10*6/uL — ABNORMAL LOW (ref 4.20–5.40)
RDW: 13 % (ref 12–15)
WBC: 9.1 10*3/uL (ref 3.50–10.80)

## 2014-04-29 LAB — GLUCOSE WHOLE BLOOD - POCT
Whole Blood Glucose POCT: 153 mg/dL — ABNORMAL HIGH (ref 70–100)
Whole Blood Glucose POCT: 209 mg/dL — ABNORMAL HIGH (ref 70–100)
Whole Blood Glucose POCT: 158 mg/dL — ABNORMAL HIGH (ref 70–100)

## 2014-04-29 LAB — MAGNESIUM: Magnesium: 1.6 mg/dL (ref 1.6–2.6)

## 2014-04-29 LAB — GFR: EGFR: >60

## 2014-04-29 LAB — PHOSPHORUS: Phosphorus: 3.4 mg/dL (ref 2.3–4.7)

## 2014-04-29 MED ORDER — INSULIN NPH (HUMAN) (ISOPHANE) 100 UNIT/ML SC SUSP
Freq: Two times a day (BID) | SUBCUTANEOUS | 0 refills | Status: DC
Start: 2014-04-29 — End: 2018-05-30
  Filled 2014-04-29: qty 10, 28d supply, fill #0

## 2014-04-29 MED ORDER — CEFUROXIME AXETIL 250 MG PO TABS
250.0000 mg | ORAL_TABLET | Freq: Two times a day (BID) | ORAL | Status: DC
Start: 2014-04-29 — End: 2014-04-29
  Filled 2014-04-29: qty 1

## 2014-04-29 MED ORDER — INSULIN SYRINGE 31G X 5/16" 0.5 ML MISC
Status: DC
Start: 2014-04-29 — End: 2018-05-30

## 2014-04-29 MED ORDER — INSULIN SYRINGE-NEEDLE U-100 30G X 5/16" 0.5 ML MISC
0 refills | Status: DC
Start: 2014-04-29 — End: 2014-04-29
  Filled 2014-04-29: qty 60, 30d supply, fill #0

## 2014-04-29 MED ORDER — VITAMIN D (ERGOCALCIFEROL) 1.25 MG (50000 UT) PO CAPS
50000.0000 [IU] | ORAL_CAPSULE | ORAL | 0 refills | Status: DC
Start: 2014-05-06 — End: 2018-05-30
  Filled 2014-04-29: qty 7, 49d supply, fill #0

## 2014-04-29 MED ORDER — MAGNESIUM SULFATE IN D5W 10-5 MG/ML-% IV SOLN
1.0000 g | INTRAVENOUS | Status: AC
Start: 2014-04-29 — End: 2014-04-29
  Administered 2014-04-29 (×2): 1 g via INTRAVENOUS
  Filled 2014-04-29 (×2): qty 100

## 2014-04-29 MED ORDER — CEFUROXIME AXETIL 250 MG PO TABS
250.0000 mg | ORAL_TABLET | Freq: Two times a day (BID) | ORAL | 0 refills | Status: DC
Start: 2014-04-30 — End: 2014-05-21
  Filled 2014-04-29: qty 14, 7d supply, fill #0

## 2014-04-29 MED ORDER — VITAMIN D (ERGOCALCIFEROL) 1.25 MG (50000 UT) PO CAPS
50000.0000 [IU] | ORAL_CAPSULE | ORAL | Status: DC
Start: 2014-04-29 — End: 2014-04-29
  Administered 2014-04-29: 50000 [IU] via ORAL
  Filled 2014-04-29 (×2): qty 1

## 2014-04-29 NOTE — Progress Notes (Signed)
Case Management Initial Discharge Planning Assessment     Psychosocial/Demographic Information   Name of interviewee/s:  patient   Orientation and decision making abilities of patient (ie a&ox3 able to make decisions, demented patient, patient on vent, etc)    oriented x4   Does the patient have an Advance Directive? Location? (home/on chart, if home-advised to bring in copy?) <no information>  Advance Directive: Patient does not have advance directive]   Healthcare Decision Maker (HDM) (if other than the patient) Include relationship and contact information.   patient   Any additional emergency contacts? Extended Emergency Contact Information  Primary Emergency Contact: Sararoni,Gonzalo   [place]  Mobile Phone: [phone]  Relation: Domestic Partner   Pt lives with:  Living Arrangements: Family members]   Type of residence where patient lives:    ]apartment with spouse and children   ]   Prior level of functioning (ambulation & ADL's)   ]independent   Support system-list  (i.e.church, friends, extended family, friends?)  family   Do you want to designate an individual who will care for or assist you upon discharge?  spouse will assist   If yes: Please list the name, relationship, phone number, and address of the designated individual. Name:  Relationship:  Phone Number:  Address:       Correct Insurance listed on face sheet - verified with the patient/HDM   uninsured      Discharge Planning Services in Place  Name of Primary Care Physician verified in patient banner (update in patient banner if not listed) Pcp, Noneorunknown, MD (General)  None SW will refer to [facility], [facility], and diabetes clinic   What DME does the patient currently own? (rolling walker, hospital bed, home O2, BiPAP/CPAP, bedside commode, cane, hoyer lift)  ]none   ]   ]   Are PT/OT services indicated? If so, has it been ordered?   no   Has the patient been to an Acute Rehab or SNF in the past?  If so, where?    no    Does the patient currently have home health or hospice/palliative services in place?  If so, list agency name.    no   Does the patient already have community dialysis set up?  If so, where?  n/a      Readmission Assessment  What is the current LACE score?  6   Is this patient an inpatient to inpatient 30 day readmission?  no   Previous admission discharge diagnosis     If readmission, what was the previous D/C plan?  Contributing factors to readmission (i.e., no follow up appt on previous d/c, unable to get meds, no insurance, no social support, etc.)     Did patient/family understand what medication was for and how to administer, symptoms to indicate worsening condition, activity and diet restrictions at time of previous d/c?        Anticipated Discharge Plan  Anticipated Disposition: Option A  home with outpatient f/u   Anticipated Disposition: Option B     Who will transport the patient upon discharge?  spouse with car   If applicable, were SNF or Hospice choices provided?  not applicable   Palliative Care Consult needed? (if yes, contact attending MD)  no      Inpatient Medicare/Medicare HMO Patients Only  Was an initial IMM signed within 24 hours of admission?  (Look in Media Tab, Documents Table or Shadow Chart)  n/a  SW will refer to diabetes clinic, d/c clinic, and [facility].  Discussed with pt and medical team.    Will continue to follow for d/c planning.    Ozella Almond, MSW  [facility] Discharge Planner  [facility]  [address]  [place], [place] [address]  Tel: [phone]  [email or web address]

## 2014-04-29 NOTE — Student Progress (Signed)
MEDICAL STUDENT NOTE    Patient's Name: Kelsey Miles    Room:  K4401/U2725.36  Attending Provider: Rock Nephew,*  Admit Date:04/27/2014  Medical Record Number: [id number]   Date/Time: 04/29/2014 9:00 AM  Pager: [phone]      Brief History  33 y/o spanish speaking female with hx of DM2 and gout who p/w to ER on Saturday 7/18 with 5 day history of back and abdominal pain, found to have DKA 2/2 pyelonephritis.     24 hour events:     NAE o/n. No complaints this AM, denies any pain. Tolerating PO intake. No f/c/n/v.     Patient Active Problem List   Diagnosis   . 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 Previous cesarean section   . Supervision of other high-risk pregnancy   . Pyelonephritis   . UTI (lower urinary tract infection)     Physical Exam:       VITAL SIGNS PHYSICAL EXAM   Temp:  [98.1 F (36.7 C)-99 F (37.2 C)] 98.8 F (37.1 C)  Heart Rate:  [89-104] 104  Resp Rate:  [16-21] 16  BP: (108-121)/(62-73) 108/63 mmHg         Intake/Output Summary (Last 24 hours) at 04/29/14 4332  Last data filed at 04/28/14 1500   Gross per 24 hour   Intake   2265 ml   Output   2700 ml   Net   -435 ml    Physical Exam  Neuro:  A&Ox3, no acute distress  HEENT: nontraumatic, PERLA, no scleral icterus, MMM  Cardiac: RRR, no murmurs, rubs, or gallops  Lungs: CTAB, no wheezes, rhonchi, rales  Abdomen: soft, NT, ND, bowel sounds present; No CVA tenderness  Ext: no edema  Skin: no rashes        Scheduled Meds: PRN Meds:        cefTRIAXone 1 g Intravenous Q24H SCH   heparin (porcine) 5,000 Units Subcutaneous Q8H SCH   insulin glargine 15 Units Subcutaneous QHS       Continuous Infusions:      acetaminophen 325 mg Q4H PRN   dextrose 15 g of glucose PRN   dextrose 25 mL PRN   glucagon (rDNA) 1 mg PRN   insulin aspart 1-8 Units PRN   morphine 1 mg Q4H PRN           Labs (last 72 hours):  Recent Labs      04/29/14   0504  04/28/14   0359  04/27/14   0242   WBC  9.10   11.88*  10.69   HEMOGLOBIN  11.6*  10.7*  12.0   HEMATOCRIT  34.9*  32.4*  35.8*     Recent Labs      04/27/14   0714   PT  14.8   PT INR  1.2*   PTT  31    Recent Labs      04/29/14   0504  04/28/14   1722  04/28/14   0359   SODIUM  139  136  137   POTASSIUM  4.0  4.5  3.9   CHLORIDE  108  107  109   CO2  21*  20*  20*   BUN  5.0*  5.0*  3.0*   CREATININE  0.6  0.6  0.5*   GLUCOSE  158*  227*  119*   CALCIUM  8.7  8.3*  7.5*   MAGNESIUM  1.6  1.8  2.0   PHOSPHORUS  3.4  3.8  2.2*                 Microbiology:   7/19 MRSA negative  7/18 Urine culture: >100,000 CFU/ML Gram negative rod    UA on 7/18: positive for large LE, nitrites, 30 protein, >1000 glucose, >80 urobilinogen, and 11-25 WBCs  7/18 Blood culture: NG at 1 day    Imaging:  7/18 CT Abd/pelvis- Heterogeneous enhancement of both kidneys with adjacent  infiltrates stranding consistent with acute pyelonephritis. No stones or  hydronephrosis of either kidney.    Procedures:  n/a    Assessment/Plan: 33 y/o DM2 female p/w DKA 2/2 pyelonephritis, now stable and doing well.      #DKA 2/2 pyelonephritis  - Insulin gtt d/c'ed around 1630 on 7/18 as gap closed  - d/c'ed IVF's on 7/19 as gap closed and tolerating PO  - resolved    #Pyelonephritis  - Urine Cx from 7/18 revealed >100,000 CFU/ML Gram negative rods  - Continue Ceftriaxone 1gm q24 hrs (day #3)  - F/u urine culture identification and sensitivity and will adjust Abx as needed  - Blood Cx- NG after 1 day    # DM2 with noncompliance  - Hgb A1C 14.8 on 7/18, previously 6.0 on 03/2011 and 6.7 on 02/2011   - Random glucose ranging 158-227, POCT 218-285 in last 24hrs  - Currently on 15 lantus and received 13 units SSI--> will increase lantus to 20 units  - Previously on metformin 850mg  BID which she obtains from mother in [place], does not check glucose (has no PCP)  - Will need diabetic education and plan to contact social work for help with access to primary care (pt does not have health insurance of PCP).  Because of her A1C patient will need to be on insulin at discharge and have close follow-up    #Electrolytes  -Hyponatremia likely 2/2 hyperglycemia- resolved, Na 139 this AM  -Hypophosphatemia- 3.4 this AM (1.9@admission )--> resolved  -Hypocalcemia- resolved    DVT ppx: HSQ  Diet: consistent carb  IVs/Foley: L PIV  Contact precautions: none  Code Status: FULL  Dispo: pending urine culture and sensitivity results and switch to PO abx      Active PICC Line / CVC Line / PIV Line / Drain / Airway / Intraosseous Line / Epidural Line / ART Line / Line / Wound / Pressure Ulcer / NG/OG Tube    Name:   Placement date:   Placement time:   Site:   Days:    Peripheral IV 04/27/14 Left Forearm  04/27/14   1250   Forearm   1          Signed by: Yves Dill, Acting Intern  Date/Time: 04/29/2014 9:00 AM

## 2014-04-29 NOTE — Plan of Care (Signed)
No events overnight. HS blood glucose was 285--pt received 5 units Insulin Aspart per SSI and 15 units of Lantus scheduled. Pt will receive Rocephin for pyelonephritis. Further diabetic education pending.

## 2014-04-29 NOTE — Discharge Instructions (Signed)
You were admitted to the hospital for infection of your kidneys and worsening of your diabetes (diabetic ketoacidosis, also known as DKA). You blood sugar level and blood abnormalities from it was severe, and you required continuous IV insulin and fluids. You received antibiotics for kidney infection and got better. Please finish a full course of antibiotics as prescribed (Ceftin for 7 more days). Because your HgbA1c level (which measures severity of your diabetes) was so high, you are now recommended to be on insulin for your diabetes. Case management helped you connect with [facility]. Please follow your insulin regimen (NPH injection 15 units before breakfast and dinner) and it is very important you follow up with primary care doctor in 3-5 days for management of your diabetes. You were also found to have low vitamin D level. Please continue to take weekly vitamin D pills for 7 more weeks.   Infeccin Renal [Kidney Infection, Female: Adult]    Una infeccin de los riones, tambin se llama "pielonefritis" (pyelonephritis). Suele comenzar como una infeccin de la vejiga (cistitis [cystitis]) que se expande a los riones. La pielonefritis (pyelonephritis) es una afeccin ms seria que una infeccin de la vejiga (bladder infection). Si no se la trata adecuadamente, puede provocar una enfermedad seria.  Los sntomas usuales incluyen un dolor continuo en la espalda, en la parte lateral o inferior del abdomen. Otros sntomas que puede incluir son: Grant Ruts, escalofros, nuseas, vmito, urgencia de Geographical information systems officer y ardor al Geographical information systems officer.  Cuidados En La Casa:  1. Qudese en su casa. No vaya al Aleen Campi o la escuela. Descanse en cama hasta que la fiebre desaparezca y usted se sienta mejor.  2. Beba mucha cantidad de lquido (al [place], entre 6 y 8 vasos por da, excepto que le hayan indicado limitar los lquidos por otras razones mdicas). Eso har que el medicamento ingrese mejor al sistema urinario y Scientist, clinical (histocompatibility and immunogenetics) las bacterias fuera  de su cuerpo.  3. Fish farm manager relaciones sexuales hasta haber terminado todo el medicamento y todos los sntomas hayan desaparecido.  4. Evite la cafena, el alcohol y las comidas muy condimentadas que puedan irritar los riones y la vejiga.  5. Puede usar acetaminofn (acetaminophen) (Tylenol) o ibuprofeno (ibuprofen) (Motrin o Advil) para Human resources officer, a menos que le hayan recetado otro medicamento. [NOTA: Si tiene una enfermedad heptica o renal crnica (chronic liver or kidney disease), o ha tenido alguna vez una lcera estomacal (stomach ulcer) o sangrado gastrointestinal (GI bleeding), consulte con su mdico antes de tomar estos medicamentos.]  Programe una VISITA DE CONTROL con su mdico, o segn le indique nuestro personal mdico, para repetir la prueba de orina en [address]. De esa manera, podr asegurar que la infeccin (infection) haya desaparecido completamente.  [NOTA: Si le han hecho una radiografa (X-ray) o una tomografa computarizada (CT scan), stas sern evaluadas por un especialista. Le informarn de los nuevos hallazgos que puedan afectar la atencin mdica que necesita.]  Busque Prontamente Atencin Mdica  si algo de lo siguiente ocurre:   Fiebre superior a los 100.39F (38.0C) despus de haber tomado antibiticos durante 48 horas.   No hay mejora despus de [employer].   Mayor dolor en la espalda o el abdomen.   Vmito persistente o la incapacidad de [employer] oral.   Debilidad, mareo o [place].   2000-2014 [employer] [address], [place], [place] [address]. Todos los derechos reservados. Esta informacin no pretende sustituir la atencin Actor. Slo su mdico puede  diagnosticar y tratar un problema de salud.

## 2014-04-29 NOTE — Progress Notes (Addendum)
Case mgmt contacted for glucometer; DME to be brought to bedside; bedside RN to provide teaching.  SCM to pay for pt's medications; advised of locations to obtain test strips at less expensive prices; SW to provide pt with follow up appts with [facility] (pt must walk in to register in person and schedule intake; pt provided with address and relevant info), [facility] (7/21, 2pm), and [facility] (7/23, 2pm).    Will continue to follow for d/c planning.    Ozella Almond, MSW  [facility] Discharge Planner  [facility]  [address]  [place], [place] [address]  Tel: [phone]  [email or web address]

## 2014-04-29 NOTE — Plan of Care (Signed)
Conducted teaching of disease pathology/maintenance, glucometer and how to check blood sugar, and how/when to administer insulin and all other medications. Patient verbalized and demonstrated understanding of all teaching points. Demonstrated glucometer and how to check sugar and self-administered insulin prior to discharge. Patient was also given [facility] diabetic educational material printed in spanish. Went over all teaching included in education packet with patient. Patient verbalized understanding. Printed AVS and went over medication administration and explained future follow-up appointments. Patient verbalized understanding.

## 2014-04-29 NOTE — Discharge Summary (Signed)
MEDICINE DISCHARGE SUMMARY    Date Time: 04/29/2014 3:19 PM  Patient Name: Kelsey Miles  Attending Physician: Rock Nephew  Primary Care Physician: Christa See, MD (General)    Date of Admission: 04/27/2014  Date of Discharge: 04/29/2014    Discharge Diagnoses:     Principal Diagnosis (Diagnosis after study, that is chiefly responsible for admission to inpatient status): Pyelonephritis  Active Hospital Problems    Diagnosis POA   . Principal Problem: Pyelonephritis Yes   . DKA (diabetic ketoacidoses) Yes        Disposition:      Home with family    Pending Results, Recommendations & Instructions to providers after discharge:     1. Micro / Labs / Path pending:   Unresulted Labs    None        2. Wound Care Instructions: N/A  3. Date of completion for antibiotics or other medications: Ceftin until 05/06/14. Vitamin D weekly until 06/17/14    Recent Labs - Last 2:         Recent Labs  Lab 04/29/14  0504 04/28/14  0359   WBC 9.10 11.88*   HEMOGLOBIN 11.6* 10.7*   HEMATOCRIT 34.9* 32.4*   PLATELETS 329 281         Recent Labs  Lab 04/27/14  0714   PT 14.8   PT INR 1.2*   PTT 31       Recent Labs  Lab 04/27/14  0242   ALKALINE PHOSPHATASE 172*   BILIRUBIN, TOTAL 0.5   BILIRUBIN, DIRECT 0.2   PROTEIN, TOTAL 6.9   ALBUMIN 3.2*   ALT 13   AST (SGOT) 19      Recent Labs  Lab 04/29/14  0504 04/28/14  1722   SODIUM 139 136   POTASSIUM 4.0 4.5   CHLORIDE 108 107   CO2 21* 20*   BUN 5.0* 5.0*   GLUCOSE 158* 227*   CALCIUM 8.7 8.3*   MAGNESIUM 1.6 1.8   PHOSPHORUS 3.4 3.8       No results for input(s): CHOL, TRIG, HDL, LDL in the last 168 hours.  No results for input(s): TSH, FREET3 in the last 168 hours.    Invalid input(s): FREET4         Procedures/Radiology performed:   Radiology: all results from this admission  Chest Ap Portable    04/27/2014     No acute cardiopulmonary process  J. Carole Binning, MD  04/27/2014 2:54 AM     Ct Abd/pelvis With Iv And Po Contrast    04/27/2014    Acute pyelonephritis.  Johnsie Kindred, MD  04/27/2014 5:52 AM        Hospital Course:     Reason for admission/ HPI:   Kelsey Miles is a 33 y.o. spanish speaking female with DM2 who presents to the hospital with 4-5 days of increasing back and abdominal pain. She states she started feeling sharp back pain 5 days ago. She does not remember what she was doing at the time but denies any injury. The pain continued to get worse and she started to develop diffuse abdominal pain and nausea. No vomiting until she arrived in the ED. She denies any dysuria but does report increased urinary frequency a few days prior to her back pain developing. She endorses fevers and chills and general malaise. She was trying to use Excedrin and colchicine for her pain (she reports that she was told to use  colchicine for body pain associated with her diabetes). She has had similar pain in the past, 3 years ago when she was pregnant. She doesn't know if she was told she had pyelonephritis or a kidney infection at the time. Her pain and nausea became unbearable so she came to the ED. She does not have a primary care doctor. She gets her Metformin and Colchicine from her mom who sends it from [place]. She does not check her blood sugar at home.     Hospital Course: Kelsey Miles is a 33 y/o spanish speaking female with a history of DM2 and gout who presented to [facility] on [facility]14 with a 4-5 day history of back and abdominal pain, nausea, increased urinary frequency and fever. She was found to have DKA 2/2 pyelonephritis.     In the ED her glucose was in the 400s with an anion gap of 15. UA was consistent with UTI and CT of the abdomen/pelvis on 7/18 showed acute pyelonephritis. She was started on insulin gtt, IVFs, and electrolyte replacement, and her gap closed the day of admission. Insulin gtt was d/c'ed 2 hrs after 30 units of Lantus was given. Her gap remained closed and BG readings were stable. She was also started on ceftriaxone and urine culture from 7/18  grew out pansensitive E. Coli. Blood cultures were negative.     Hgb A1C on 7/18 was found to be 14.8. The patient does not have insurance and has not had a PCP for two years. The patient reported noncompliance with her metformin 750mg  BID which is sent to her by her mother every 6 months from [place]. She does not check her glucose at home.    Vit D level was 13 on 7/19 and patient was started on 50,000 units of Vit D weekly for 6-8 weeks.     At discharge the patient was switched over to PO cefitin for seven days for a total 10 day antibiotic course. She was started on 15 units of NPH insulin BID and provided with resources by case management to follow-up at the diabetes clinic at [facility] and the [facility].     Discharge Day Exam:  Temp:  [98.1 F (36.7 C)-98.8 F (37.1 C)] 98.4 F (36.9 C)  Heart Rate:  [85-104] 85  Resp Rate:  [16-17] 16  BP: (97-116)/(59-73) 97/59 mmHg    NAD. AAO x 3.  MMM. OP Clear. PERRL. EOMI  RRR. S1S2. No m/r/g.   CTAB. No w/r/c.  No edema of extremities    Consultations:     Treatment Team:   Attending Provider: Rock Nephew, MD  Resident: Ralene Bathe, MD  Resident: Garnetta Buddy, MD  Resident: Verta Ellen, DO    Discharge Condition:     Good    Discharge Instructions & Follow Up Plan for Patient:     Diet: Diabetic diet    Activity/Weight Bearing Status: Full activity      Patient was instructed to follow up with:           Follow-up Information    Follow up with Pcp, Noneorunknown, MD.        Call in 3 days to follow up.        Follow up with [facility]. Go in 1 day.    Why:  TIENE QUE REGISTRAR PERSONALMENTE EN LA OFICINA, LO MAS PRONTO POSIBLE    Contact information:    [address] [place]  [address]  [phone]        Follow up with [facility]. Go on 05/02/2014.    Why:  CITA: 05/02/14, 2:00PM    Contact information:    [address]  [place] [address]  [phone]             Discharge Code Status:Full  Patient Emergency Contact: Shirlee More    Complete instructions and follow up are in the patient's After Visit Summary    Minutes spent coordinating discharge and reviewing discharge plan:45 minutes    Discharge Medications:        Discharge Medication List      Taking         cefuroxime 250 MG tablet   Dose:  250 mg   Commonly known as:  CEFTIN   Start taking on:  04/30/2014   Take 1 tablet (250 mg total) by mouth every 12 (twelve) hours.       insulin isophane 100 UNIT/ML injection   Dose:  15 Unit   Commonly known as:  HUMULIN N   Inject 15 Unit into the skin 2 (two) times daily before meals.       Vitamin D (Ergocalciferol) 50000 UNIT Caps   Dose:  50000 Units   Commonly known as:  DRISDOL   Start taking on:  05/06/2014   Take 1 capsule (50,000 Units total) by mouth once a week.         STOP taking these medications         colchicine 0.6 MG tablet       metFORMIN 850 MG tablet   Commonly known as:  GLUCOPHAGE           (FYI: you must refresh the link after final D/C Med reconciliation)    Immunizations provided: none    [facility]  Department of Medicine  P: [phone]  F: [phone]    Signed by: Ralene Bathe, MD    CC: Christa See, MD (General)

## 2014-04-30 ENCOUNTER — Ambulatory Visit (INDEPENDENT_AMBULATORY_CARE_PROVIDER_SITE_OTHER): Payer: Self-pay

## 2014-04-30 VITALS — Ht 58.5 in | Wt 116.8 lb

## 2014-04-30 DIAGNOSIS — IMO0001 Reserved for inherently not codable concepts without codable children: Secondary | ICD-10-CM

## 2014-04-30 NOTE — Progress Notes (Signed)
Patient referred for insulin start and education. Spanish interpreter facilitated (phone line, interpreter [phone]) with good result.    Findings:   Patient with diabetes 8 years; not recently receiving medical care, but scheduled to see provider at [facility] in two days   She was recently hospitalized with DKA   She states that she does not understand what diabetes is; was told not to eat starchy foods and sweets, and not to drink sodas and fruit juice   Lives with husband and three children   Family history significant for diabetes type 2; sister died from complications of diabetes   She reports a little depression affecting her daily life    Knowledge and Learning Needs Assessment:    Ratings:   1 = needs instruction  2 = needs review  3 = comprehends key points  4 = demonstrates competency  N/a = not assessed    Understanding diabetes 1   Healthy eating for diabetes and heart disease 1   Physical Activity: benefits and special needs 1   Medication/insulin use  1   Self-monitoring 1   Pattern management and problem solving 1   Preventing complications 1   Risk reduction 1   Behavior change and goal setting 1   Healthy coping 1     The Educator has assessed her overall outcome of learning so far today to be:   Marland Kitchen Partial/Still Learning  The Educator has noticed there may be some Barriers to learning related to the following:  . Language; Spanish interpreter required (patient reads Spanish)  Patient was provided Living Well With Diabetes booklet in Spanish    Nutrition and food topics covered today included:  . Carbohydrate Effect on BG  . Carbohydrate Counting   Carbs recommendations: meals and snacks  . Meal Planning with Plate Method   Estimating food portions  . Reading Food Labels  . Recording Food Intake    Additional education needs:   Fixed carb intake with NPH insulin   Internet and Phone Nutrition Resources    Exercise and activity; benefits, safety, recommendations   Insulin skills, including  preventing and treating hypoglycemia   Monitoring blood glucose: test target ranges and frequency    She will return to continue diabetes self-management education in two weeks.

## 2014-04-30 NOTE — Progress Notes (Signed)
Transitional Care Management    Enrollment-    Name/Number of person who participated in call:  Kelsey Miles Ambros/[phone]    TCM Program explained and patient consents to participate in program: Yes    HIPAA verification completed and notified that call is recorded:  Yes     Provided patient with TCM contact information:  Yes      Primary language spoken: Spanish    Interpreter needed:  Yes      Health History-    Health Status (PMH, current admission summary, previous admission history):  Past Medical History   Diagnosis Date   . Diabetes mellitus associated with hormonal etiology 2007   . Gout      Patient Active Problem List   Diagnosis   . 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 Previous cesarean section   . Supervision of other high-risk pregnancy   . Pyelonephritis   . DKA (diabetic ketoacidoses)       Pt. Was seen in the [facility] ED in 2014 for a foreign body in her ear. On 04/27/14, pt. Was admitted to [facility] for UTI and DM. Pt. Has a history of DM and gout.    TCM Diagnosis (include A1C, EF, weight):   Lab Results   Component Value Date    HGBA1C 14.8* 04/27/2014             Pt's blood sugar upon admission was 418, A1C was 14.8. Pt. Is 5'1" and 123lbs.    Does the patient understand why he/she was admitted to the hospital:  Yes- urine infection       Medication Reconciliation-    Medication List:  Current Outpatient Prescriptions   Medication Sig Dispense Refill   . cefuroxime (CEFTIN) 250 MG tablet Take 1 tablet (250 mg total) by mouth every 12 (twelve) hours. 14 tablet 0   . insulin NPH (HUMULIN,NOVOLIN) 100 UNIT/ML injection Inject 15 Unit into the skin 2 (two) times daily before meals, in the morning and evening. 10 mL 0   . Insulin Syringe-Needle U-100 (INSULIN SYRINGE .5CC/31GX5/16") 31G X 5/16" 0.5 ML Misc Insulin needle 100 each 0   . [START ON 05/06/2014] Vitamin D, Ergocalciferol, (DRISDOL) 50000 UNIT Cap Take 1 capsule (50,000  Units total) by mouth once a week. 7 capsule 0     No current facility-administered medications for this visit.       Were the medications reviewed with the patient:  Yes     Was the patient able to obtain all of his/her medications when they left the hospital:  Yes     Does the patient understand what all of his/her medications are for: Yes- Pt. Was able to name all of her medications, doses and what they were for,    Was teaching done on medications:  Yes- CM reminded pt. To finish her course of antibiotics even though she is feeling better.     Plan for resolution of any medication concerns: None. Medication Reconciliation completed      Physician Follow Up-    Plan for follow up with PCP or endocrinologist within 3-5 days of discharge:  Yes. [facility] on 05/02/14    Name/number of PCP: [facility]    If uninsured and without a medical home, plan for placement to a permanent medical home:  [facility]    Diabetes Management-    Does a patient have a glucometer and supplies:  Yes  If no glucometer or supplies, what is the plan:     How many times per day was the patient instructed to check his/her blood sugar:     Has the patient's MD prescribed an acceptable glucose range:     What was blood sugar range since hospital discharge:    Is the patient keeping a blood sugar log:  No: - CM instructed pt. To keep a log and bring it to her Clinic appt. This week.     Was the patient educated on the diabetes red flags/symptoms:  Yes        Hypoglycemia-sweating, shaking, dizziness, upset, nervous, consumed, blurry vision, weak, tired, headache, or hungry   Hyperglycemia-increased thirst, frequent urination, weak, tired, blurry vision, dry   skin, or hungry    Is the patient currently experiencing any of the diabetes red flags/symptoms:  No    Was the patient instructed on what to do if he/she is experiencing any of the diabetes red flags/symptoms:  Yes - If blood sugar is high, drink water and  exercise as able. If blood sugar is low, have juice or candy and re-check blood sugar in 15 minutes to see if glucose is within the normal range.    Has the patient been instructed to always have glucose tablets or fast acting sugar with them in case of low blood sugar:  Yes    Was [facility] TCM visit initiated:  No- Pt. declined     Educational materials mailed to patient:  No - Will hand to pt. On 05/02/14 and [facility].      Call Summary Notes- Spoke to pt. Through Spanish Interpretor [phone]. Pt. Reported that she was feeling fine, no UTI or DM symptoms at the present time. Red Flags of DM were reviewed and how to correct high and low blood sugars. Pt. Completed the Medication Reconciliation, no concerns. Pt. [facility] and will be seen by CM in [facility] on 05/02/14. CM to meet with pt. At Clinic for DM Management.    Julieanne Cotton, MS, [facility]  Case Manager  Transitional Care Management  [facility]  T [phone]  F [phone]

## 2014-05-02 ENCOUNTER — Ambulatory Visit (INDEPENDENT_AMBULATORY_CARE_PROVIDER_SITE_OTHER): Payer: Self-pay | Admitting: Nurse Practitioner

## 2014-05-02 ENCOUNTER — Encounter (INDEPENDENT_AMBULATORY_CARE_PROVIDER_SITE_OTHER): Payer: Self-pay | Admitting: Nurse Practitioner

## 2014-05-02 VITALS — BP 91/59 | HR 91 | Temp 98.3°F | Resp 17 | Wt 118.0 lb

## 2014-05-02 DIAGNOSIS — E1310 Other specified diabetes mellitus with ketoacidosis without coma: Secondary | ICD-10-CM

## 2014-05-02 DIAGNOSIS — E1110 Type 2 diabetes mellitus with ketoacidosis without coma: Secondary | ICD-10-CM

## 2014-05-02 LAB — POCT GLUCOSE: Whole Blood Glucose POCT: 297 mg/dL — AB (ref 70–100)

## 2014-05-02 MED ORDER — METFORMIN HCL 500 MG PO TABS
500.0000 mg | ORAL_TABLET | Freq: Two times a day (BID) | ORAL | Status: DC
Start: 2014-05-02 — End: 2014-05-09

## 2014-05-02 NOTE — Progress Notes (Signed)
Transitional Care Management      Diabetes Management-Teachback-    Can the patient identify the signs and symptoms or low blood sugar:  Yes- shaky, dizzy, sweaty     Can the patient identify the signs and symptoms of high blood sugar:  Yes - HA, hot    Can the patient identify what to do if he/she experiences these symptoms:  Yes- If blood sugar is high, drink extra water and exercise as able. If blood sugar is low, have juice or candy and re-check blood sugar in 15 minutes to make sure glucose is within the normal range of 70-130.     Does the patient know to always have glucose tablets or fast acting sugar with them in case of low blood sugar:  Yes      Physician Follow Up/Medication Follow Up-    Was the patient seen by PCP or endocrinologist within 3-5 days of hospital discharge:  Yes- 05/02/14 [facility]     Were any medications changed at follow up medical appointment:  Yes - Metformin added 500mg  2x/day     If uninsured and without a medical home, follow up on plan for placement to a permanent medical home:  [facility]      Depression Screen-    Over the past 4 weeks, has the patient felt down, depressed or hopeless:  Yes    Over the past 4 weeks, has the patients felt little interest or pleasure in doing things:  No      Plan if positive response to one or both of the depression screen questions:- Pt. Reported that at times, she just wants everyone to disappear so she can be by herself. When pt. Feels this way, she pictures herself crying and letting out all of her emotions. Pt. Denied ever wanting to hurt herself or anyone else and denied any plan. Pt. Was interested in support groups in her area and CM will make sure that pt. Receives referrals and groups in [place].      Gaps in Care/Resource Connection-    Living situation (alone, family, home environment, etc): Pt. Lives with her husband and 3 children in an apt. In [place], [place]. Pt. Does not work. Pt's husband works in [employer]. Pt. Has  no friends or family support here. Pt's family reside in [place].    Gap in Care (insurance status, medication coverage, community connections, transportation, etc): Uninsured    Referrals placed for patient: [employer], [place]      Call Summary Notes- Met with pt. At [facility] on 05/02/14. Pt. Had [facility] the previous day. Pt.'a AM blood sugars are running 198, PM 250-270. Pt. Denies having any symptoms of high blood sugar. Pt. Completed the Depression Screen, denied any suicidal thoughts, ideas or plans. Pt. Is open to attending a support group and CM will assist pt. To find one in her area. Pt. Feels unable to talk to her spouse. Metformin was added to pt's medication regimen and she is keeping a blood sugar log. CM will meet with pt. On 05/09/14. For further DM Management.    Julieanne Cotton, MS, [facility]  Case Manager  Transitional Care Management  [facility]  T [phone]  F [phone]

## 2014-05-02 NOTE — Progress Notes (Signed)
[facility]  Diabetes History & Physical    Chief Complaint:   Follow up for Diabetes and pyelonephritis     HPI:   This patient is a 33 y.o. year old Hispanic  female, seen at [facility] 7/18-7/20/15. Presented to the ED with  abdominal pain, nausea, increased urinary frequency and fever. She was found to have DKA secondary to  Pyelonephritis.      1. Diabetes Mellitus --- HGBA1C is 14.8% POCT is 297 taking Novolin N 15 units BID. Not taking any oral hypoglycemics.   Reports strong family history of diabetes, father and brother alive with diabetes, sister died of diabetic complications.     2. Pyelonephritis --- taking Ceftin 250 mg BID, denies any nausea, vomiting, diarrhea, fever, chills or abdominal pain.      3. Vitamin D deficiency -- taking 50 000 units of Vitamin D.   Vitamin D level was 13 on 7/19 and patient was started on 50,000 units.     Language/Use of interpreter?  Spanish interpreter [phone] used.   Patient has/is using a glucometer? Yes   Glucose log result: POCT 297     PMSH:     Past Medical History   Diagnosis Date   . Diabetes mellitus associated with hormonal etiology 2007   . Gout        Past Surgical History   Procedure Laterality Date   . Cesarean section         Allergies:   No Known Allergies    Meds:     Prior to Admission medications    Medication Sig Start Date End Date Taking? Authorizing Provider   cefuroxime (CEFTIN) 250 MG tablet Take 1 tablet (250 mg total) by mouth every 12 (twelve) hours. 04/30/14   Ralene Bathe, MD   insulin NPH (HUMULIN,NOVOLIN) 100 UNIT/ML injection Inject 15 Unit into the skin 2 (two) times daily before meals, in the morning and evening. 04/29/14   Ralene Bathe, MD   Insulin Syringe-Needle U-100 (INSULIN SYRINGE .5CC/31GX5/16") 31G X 5/16" 0.5 ML Misc Insulin needle 04/29/14   Ralene Bathe, MD   metFORMIN (GLUCOPHAGE) 500 MG tablet Take 1 tablet (500 mg total) by mouth 2 (two) times daily with meals. 05/02/14 05/02/15  Rella Larve, NP    Vitamin D, Ergocalciferol, (DRISDOL) 50000 UNIT Cap Take 1 capsule (50,000 Units total) by mouth once a week. 05/06/14   Ralene Bathe, MD     FH:     Family History   Problem Relation Age of Onset   . Diabetes Father    . Diabetes Brother    . Diabetes Sister      SH:     History     Social History   . Marital Status: Significant Other     Spouse Name: N/A     Number of Children: N/A   . Years of Education: N/A     Occupational History   . Not on file.     Social History Main Topics   . Smoking status: Never Smoker    . Smokeless tobacco: Not on file   . Alcohol Use: No   . Drug Use: Not on file   . Sexual Activity:     Partners: Male     Other Topics Concern   . Not on file     Social History Narrative     ROS:   All other systems reviewed and negative except as described above.  Physical Exam:     Filed Vitals:    05/02/14 1405   BP: 91/59   Pulse: 91   Temp: 98.3 F (36.8 C)   Resp: 17   SpO2: 98%     General: awake, alert, oriented x 3; no acute distress.  HEENT: perrla, eomi, sclera anicteric,    Neck: supple, no lymphadenopathy, no thyromegaly,  Cardiovascular: regular rate and rhythm, no murmurs, rubs or gallops  Lungs: clear to auscultation bilaterally, without wheezing, rhonchi, or rales  Abdomen: soft, non-tender, non-distended; no palpable masses,  normoactive bowel sounds, no rebound or guarding.   Extremities: no clubbing, cyanosis, or edema      Diagnostics:     Lab Results   Component Value Date    WBC 9.10 04/29/2014    HGB 11.6* 04/29/2014    HCT 34.9* 04/29/2014    MCV 83.3 04/29/2014    PLT 329 04/29/2014     Lab Results   Component Value Date    CREAT 0.6 04/29/2014    BUN 5.0* 04/29/2014    NA 139 04/29/2014    K 4.0 04/29/2014    CL 108 04/29/2014    CO2 21* 04/29/2014     Lab Results   Component Value Date    ALT 13 04/27/2014    AST 19 04/27/2014    ALKPHOS 172* 04/27/2014    BILITOTAL 0.5 04/27/2014     Lab Results   Component Value Date    HGBA1C 14.8* 04/27/2014     No results found  for: CHOL  No results found for: HDL  No results found for: LDL  Lab Results   Component Value Date    TRIG 358* 07/06/2011     No results found for: MICROALB    ASSESSMENT:     Patient Active Problem List   Diagnosis   . Pyelonephritis   . DKA (diabetic ketoacidoses)       Patient BP is 91/59 unable to start ACE-- inhibitor due to low BP.     PLAN:     1) Diabetes mellitus -- continue insulin.  Start Metformin 500 mg BID.  2. May start ACE- inihibitor at next visit if blood pressure allows (is 91/59).  3. May also start ASA and Statin at the next visit.   4. Pyelonephritis -- will obtain Urinalysis at the next clinic visit.   5. Vitamin D-- deficiency ---- continue to take 50 000 units of Vitamin D.   5. Referred to Pharmacist, Lissa Merlin  for Diabetes management.   6. Can be discharged at the discretion of the Pharmacist.       DIABETES QUAILTY MEASURES    IS PATIENT AT MINIMUM ON METFORMIN IF TYPE II DIABETIC?  Yes  Should try to even if patient on insulin, contraindications incude Cre >1.5 men, >1.4 women, GI intolerance, severe systolic heart failure, liver failure.    HAS ASSESSMENT FOR DIABETIC NEPHROPATHY BEEN MADE? No:   Urine spot protein/creatinine ratio>30 or positive urine for microalbumin,  not necessary to do if patient already on ACE or ARB or patient has existing CKD.    IS THE PATIENT ON AN ACE INHIBITOR (OR ARB IF CAN'T TOLERATE)?  Blood pressure can't tolerate  Indications are co-morbid CKD, HTN, MI, systolic HF, or proteinuria/microalbinuria.    WAS LIPID THERAPY INITIATED IF APPLICABLE? No:   Moderate intensity statins recommended with dx of DM age 60-75, high intensity statins with ASCVD risk > 7.5%    IS BP GOAL <140/80  MET? Yes  First line choice of antihypertensive should be ACEI or ARB.    WAS SMOKING CESSATION ADDRESSED IF THE PATIENT IS A SMOKER? Non-smoker    IS ASA ON BOARD WITH COMORBID CAD OR HIGH CV RISK? No  Would recommend in patieents with comorbid CAD, HTN, albuminuria, CVD, PVD, or if  the patient is a smoker or has a strong family history.    HAS A1c BEEN CHECKED WITHIN 3 MOS? Yes  14.8% as of 04/27/14.     WAS A FOOT EXAM COMPLETED? No:     HAS THE PATIENT BEEN REFERRED TO [facility]? Yes    WAS THE PATIENT GIVEN EDUCATION ABOUT DIABETES WITH TEACH BACK?  Yes  Focus on hypoglycemia, hyperglycemia, action plan    WAS A REFERRAL MADE TO CLINICAL SUPPORT STAFF FOR ADDITIONAL EDUCATION?   N/A    WAS MEDICATION RECONCILIATION PERFORMED? Yes    WAS THE PATIENT'S RECENT ER OR HOSPITAL VISIT DISCUSSED AND APPROPRIATE FOLLOW UP ARRANGED? Yes     FOLLOW-UP PLAN:  Return to the clinic in 1 week.       PROGRESS ON ESTABLISHING A MEDICAL HOME: [facility] per case management

## 2014-05-09 ENCOUNTER — Ambulatory Visit (INDEPENDENT_AMBULATORY_CARE_PROVIDER_SITE_OTHER): Payer: Charity

## 2014-05-09 DIAGNOSIS — E1310 Other specified diabetes mellitus with ketoacidosis without coma: Secondary | ICD-10-CM

## 2014-05-09 DIAGNOSIS — Z79899 Other long term (current) drug therapy: Secondary | ICD-10-CM

## 2014-05-09 DIAGNOSIS — N12 Tubulo-interstitial nephritis, not specified as acute or chronic: Secondary | ICD-10-CM

## 2014-05-09 DIAGNOSIS — Z794 Long term (current) use of insulin: Secondary | ICD-10-CM

## 2014-05-09 DIAGNOSIS — E1110 Type 2 diabetes mellitus with ketoacidosis without coma: Secondary | ICD-10-CM

## 2014-05-09 DIAGNOSIS — Z5181 Encounter for therapeutic drug level monitoring: Secondary | ICD-10-CM

## 2014-05-09 LAB — POCT GLUCOSE: Whole Blood Glucose POCT: 248 mg/dL — AB (ref 70–100)

## 2014-05-09 MED ORDER — METFORMIN HCL 1000 MG PO TABS
1000.0000 mg | ORAL_TABLET | Freq: Two times a day (BID) | ORAL | Status: DC
Start: 2014-05-09 — End: 2015-05-09

## 2014-05-09 NOTE — Patient Instructions (Signed)
FOR YOUR DIABETES:   Tome metformin 500mg , dos pastillas en la maana con el desayuno y dos pastillas en la noche con la cena.   Cuando termine su metformin 500mg , comience a toma metformin 1000mg , una pastilla en la maana con el desayuno y una pastilla en la noche con la cena. La nueva receta para la metformin 1000mg  estar en [facility].   Inyectar la insulina 18 unidades en la maana antes del desayuno y 15 unidades en la noche antes de la cena.   Cheque su azcar en la maana antes del desayuno, antes de la cena, y 2 horas despues de la cena (o antes de dormir)   Llame a la clnica si su azcar en la sangre es bajo a 70.

## 2014-05-09 NOTE — Progress Notes (Signed)
Kelsey Miles Kelsey Miles is a 33 y.o. female who presents to clinical pharmacy ambulatory care services for management of type 2 DM.      Assessment     1 - T2DM - goal A1c < 7%, most recent A1c elevated above goal. No SMBGs to assess today, but based on pt report which correlates with clinic POCT glucose today, SMBGs well elevated above goal with no pt report of hypoglycemia. Not yet optimized on metformin.     Plan      Increase metformin to 1000mg  PO BID   Increase insulin to 18 units SubQ QAM; continue 15 units SubQ QPM   SMBGs fasting, AC dinner, and 2H PC dinner (or HS)   Education reinforced on how to recognize/resolve s/sxs of hypoglycemia. Pt active with [facility] for DM education   Follow-up: 2 weeks with PharmD    Disposition     Deferred to TCM CM, Kelsey Miles, who met with pt today. [facility] referral completed during hospitalization.     Subjective     [name] Spanish language interpreter utilized today. Patient reports the following:    DM -   Dx 8 years ago, father and brother also with DM   Confirms taking metformin 500mg  PO BID (from the [place], not from home country) and NPH 15 units SubQ Q12h. Has always been on insulin, not new to her   Did not bring SMBG log but per pt memory, within the last week, fasting BGs 140-270s; AC dinner BGs 280-390s. Two-hour post-prandial clinic POCT glucose today 248mg /dL.    Pt describes diet as breakfast (8AM) cereal/milk +/- eggs; lunch (1PM) beans, rice, meat; dinner (8PM) same as lunch. HS at 10PM. Denies skipping meals   Denies s/sxs of hypoglycemia in the last week      Objective     Lab Results   Component Value Date    HGBA1C 14.8* 04/27/2014    HGBA1C 6.0 04/02/2011    HGBA1C 6.7* 02/09/2011       Lab Results   Component Value Date    CREAT 0.6 04/29/2014    BUN 5.0* 04/29/2014    NA 139 04/29/2014    K 4.0 04/29/2014    CL 108 04/29/2014    CO2 21* 04/29/2014       ---  Patient verbalized understanding and agreement to above education  and plan. All questions answered.     The above plan was developed per collaborative practice agreement with Lianne Bushy, MD. Patient informed of collaborative practice and consent form signed on 05/09/14.     ---  Lala Lund, PharmD, BCPS  Clinical Pharmacy Specialist

## 2014-05-09 NOTE — Progress Notes (Signed)
Transitional Care Management    Diabetes Management-Teachback-    Can the patient identify the signs and symptoms or low blood sugar:  Yes- shaky, dizzy, sweaty     Can the patient identify the signs and symptoms of high blood sugar:  Yes, HA, hot     Can the patient identify what to do if he/she experiences these symptoms:  Yes- If pt's blood sugars are high, drink extra water and take walks, preferably with her kids and husband. If blood sugar is low, have juice or candy and re-check blood sugar in 15 minutes to see if blood sugars are within the normal range.     Does the patient know to always have glucose tablets or fast acting sugar with them in case of low blood sugar:  Yes      Wellness-    What is the patient's current/history of alcohol use/abuse: None    What is the patient's current/history of tobacco use/abuse: None    Does the patient need/want information on assistance programs for alcohol abuse or smoking cessation?  N/A    How often/much does the patient exercise: Pt. Takes walks with her children about 3x week as able.    Has the patient been given exercise guidance from his/her doctor:  No: - No restrictions    Education on exercise provided: CM explained the relationship between high blood sugar and how exercise can help reduce that.    Is the patient having any issues with sleeping well:  No    Has the patient discussed these issues with his/her doctor:  Yes      Diabetes Self management/Health Maintenance-    Does the patient know what a Hemoglobin A1C is:  No: - CM explained what the A1C test measured, what a normal range is and how pt can try to reduce hers.     What is the patient's most recent Hemoglobin A1C: 14.8    Has the patient's doctor given instructions on managing diabetes when the patient is sick:  Yes    Sick management education completed: Yes. The diabetic Sick Day rules are reviewed with pt. verbally. If following usual diet, stay on same dose of diabetic medication, maintain  high fluid intake, and perform home glucose monitoring QID. If not able to maintain normal diet due to illness , glucometer readings QID until well and stable, maintain hydration with high fluid intake, call MD if glucose above 400    Has the patient's doctor recommended a special diet:  Yes - Diabetic    Diet education completed: CM and pt. Discussed diabetes diet in depth including; portion control using the "Dinner Plate" method. Reading labels for sugar and carbohydrate content, and going over typical daily healthy menus. Pt. Is eating mainly; chicken, beans, potato and rice. Pt. Has increased her intake of water and is trying to walk more.    Has the patient been instructed on the following:   Influenza vaccine  Yes   Pneumococcal vaccine  Yes   Health record  Yes   Ophthamology exam  Yes   Podiatry exam  Yes   Foot care  Yes   Dental care  Yes       Advance Directives-    Has the patient participated in an advance care planning process:  No       Has the patient completed an advance care planning document:  No      Do all interested parties have a copy of the  advance care planning document:  No      Does the patient want further information on advance directives:  No- Pt. Not interested in discussing this at this time.    Educational materials mailed to patient:  No          Call Summary Notes- Spoke to pt. At [facility] meeting. Pt's blood sugars were slightly improved, AM 143, PM 298. Pt's dose of Metformin was changed to 1,000mg  2x/day. Pt. And CM discussed pt's mental health. Pt. Denied having any suicidal thoughts, ideas or plans. Pt. Stated she spoke to her husband and children about feelings of depression and anger she gets and does not know from where, which is why she locks herself in her room. Pt. Admits to rejecting her daughters hugs at times and explained to them she is not angry and loves them but just needs to be by herself at times, so now they cuddle quick and leave. Pt. Finally shared with her  husband what was going on and they are working on their relationship. Pt. Was upset because she asked her daughters who they would go with if she moved out and both said dad. Pt. [facility] and CM will again speak to NP, Elyn Peers about starting anti-depressant medication until pt. Is accepted at [facility]. CM will ask at [facility] if there is a class fo rSpanish speaking woman w/ depression in the community that they are aware of.  CM will follow-up 05/10/14 with Elyn Peers and [facility] Favre for assistance.    Julieanne Cotton, MS, [facility]  Case Manager  Transitional Care Management  [facility]  T [phone]  F [phone]

## 2014-05-13 ENCOUNTER — Ambulatory Visit: Payer: Charity

## 2014-05-13 LAB — URINALYSIS REFLEX TO MICROSCOPIC EXAM - REFLEX TO CULTURE
Bilirubin, UA: NEGATIVE
Ketones UA: NEGATIVE
Nitrite, UA: NEGATIVE
Protein, UA: NEGATIVE
Urine Specific Gravity: 1.011 (ref 1.005–1.030)
Urobilinogen, Ur: 0.2 mg/dL (ref 0.0–1.9)
pH, UA: 7 (ref 5.0–7.5)

## 2014-05-13 LAB — REFLEX - URINE CULTURE, ROUTINE

## 2014-05-13 LAB — REFLEX - MICROSCOPIC EXAMINATION
Bacteria, UA: NONE SEEN
RBC, UA: NONE SEEN /hpf (ref 0–?)

## 2014-05-16 ENCOUNTER — Telehealth: Payer: Self-pay

## 2014-05-16 NOTE — Telephone Encounter (Signed)
Attempted to contact pt. At [phone]. Through Spanish Interpretor [phone]. Pt's 33 y/o child answered and stated pt. Was at the store. Left Name for youth to tell mother who called. CM will see pt. At Clinic appt. On 05/21/14.    Julieanne Cotton, MS, [facility]  Case Manager  Transitional Care Management  [facility]  T [phone]  F [phone]

## 2014-05-21 ENCOUNTER — Ambulatory Visit (INDEPENDENT_AMBULATORY_CARE_PROVIDER_SITE_OTHER): Payer: Self-pay

## 2014-05-21 DIAGNOSIS — E119 Type 2 diabetes mellitus without complications: Secondary | ICD-10-CM

## 2014-05-21 DIAGNOSIS — E1110 Type 2 diabetes mellitus with ketoacidosis without coma: Secondary | ICD-10-CM

## 2014-05-21 DIAGNOSIS — E1310 Other specified diabetes mellitus with ketoacidosis without coma: Secondary | ICD-10-CM

## 2014-05-21 DIAGNOSIS — Z79899 Other long term (current) drug therapy: Secondary | ICD-10-CM

## 2014-05-21 LAB — POCT GLUCOSE: Whole Blood Glucose POCT: 169 mg/dL — AB (ref 70–100)

## 2014-05-21 MED ORDER — GLIPIZIDE 5 MG PO TABS
ORAL_TABLET | ORAL | Status: AC
Start: 2014-05-21 — End: 2015-05-22

## 2014-05-21 NOTE — Progress Notes (Signed)
Kelsey Miles is a 33 y.o. female who presents to clinical pharmacy ambulatory care services for management of type 2 DM.      Assessment     1 - T2DM - goal A1c < 7%, most recent A1c elevated above goal. Only checking BID, SMBGs elevated, initiate glipizide to target fasting/dinner. Already optimized on metformin.     Plan      Initiate glipizide 5mg , one-HALF tablet (2.5mg ) PO QPM AC dinner    Continue metformin 1000mg  PO BID   Continue insulin to 18 units SubQ QAM and 15 units SubQ QPM   SMBGs fasting, AC dinner, and 2H PC dinner (or HS)   Education reinforced on how to recognize/resolve s/sxs of hypoglycemia. Pt active with [facility] for Well[facility] for DM education   Follow-up: 2 weeks with PharmD    Disposition     Deferred to TCM CM, Julieanne Cotton. [facility] referral completed during hospitalization. Pt today states she has not yet received [facility] enrollment letter.     Subjective     [place] Spanish language interpreter utilized today. Patient reports the following:    DM -   Confirms taking metformin 1000mg  PO BID and NPH 18 units SubQ QAM and 15 units SubQ QPM.    Clinic POCT glucose today 169mg /dL, 2H PC lunch   Brought glucometer. Most recent BGs per glucometer below:   Fasting: 171, 167, 122, 151   Either AC dinner or 2H PC dinner, pt does not recall: 299, 207, 201   Denies s/sxs of hypoglycemia in the last week      Objective     Lab Results   Component Value Date    HGBA1C 14.8* 04/27/2014    HGBA1C 6.0 04/02/2011    HGBA1C 6.7* 02/09/2011       Lab Results   Component Value Date    CREAT 0.6 04/29/2014    BUN 5.0* 04/29/2014    NA 139 04/29/2014    K 4.0 04/29/2014    CL 108 04/29/2014    CO2 21* 04/29/2014       ---  Patient verbalized understanding and agreement to above education and plan. All questions answered.     The above plan was developed per collaborative practice agreement with Lianne Bushy, MD. Patient informed of collaborative practice and consent form signed on  05/09/14.     ---  Lala Lund, PharmD, BCPS  Clinical Pharmacy Specialist

## 2014-05-21 NOTE — Patient Instructions (Signed)
FOR YOUR DIABETES:   Comience a tomar glipizide 5mg , media () pastilla en la noche con la cena. Usted debe comer despus de tomar glipizide. La nueva receta para la glipizide estar en [facility].   Siga tomando la metformina 1000mg , una pastilla en la maana con el desayuno y una pastilla en la noche con la cena.    Inyectar la insulina NPH 18 unidades en la maana antes del desayuno y 15 unidades en la noche antes de la cena.   Cheque su azcar en la maana antes del desayuno, antes de la cena, y 2 horas despues de la cena (o antes de dormir)   Llame a la clnica si su azcar en la sangre es bajo a 70.

## 2014-05-24 NOTE — Progress Notes (Signed)
Spoke to pt. Through Spanish Interpretor [phone]. Pt. Reported that Glipizide 1/2 pill (unknown dose) had been added to her medications to take before dinner and her blood sugars are beginning to come down. Pt's average blood sugar in the morning is 101-150, PM 160-200. Pt. Reported feeling better both physically and emotionally. Pt. Had no further questions or concerns. Pt. Has an appt. At [facility] on 06/04/14 for DM follow-up. Case will close 05/31/14 if pt. Has no further needs.    Julieanne Cotton, MS, [facility]  Case Manager  Transitional Care Management  [facility]  T [phone]  F [phone]

## 2014-06-04 ENCOUNTER — Encounter (INDEPENDENT_AMBULATORY_CARE_PROVIDER_SITE_OTHER): Payer: Charity

## 2015-05-26 ENCOUNTER — Emergency Department: Payer: Self-pay

## 2015-05-26 ENCOUNTER — Emergency Department
Admission: EM | Admit: 2015-05-26 | Discharge: 2015-05-26 | Disposition: A | Discharge status: Home / Self Care | Payer: Self-pay | Attending: Emergency Medicine | Admitting: Emergency Medicine

## 2015-05-26 DIAGNOSIS — Z794 Long term (current) use of insulin: Secondary | ICD-10-CM | POA: Insufficient documentation

## 2015-05-26 DIAGNOSIS — Y92410 Unspecified street and highway as the place of occurrence of the external cause: Secondary | ICD-10-CM | POA: Insufficient documentation

## 2015-05-26 DIAGNOSIS — E119 Type 2 diabetes mellitus without complications: Secondary | ICD-10-CM | POA: Insufficient documentation

## 2015-05-26 DIAGNOSIS — T148XXA Other injury of unspecified body region, initial encounter: Secondary | ICD-10-CM

## 2015-05-26 DIAGNOSIS — Y9301 Activity, walking, marching and hiking: Secondary | ICD-10-CM | POA: Insufficient documentation

## 2015-05-26 DIAGNOSIS — S60512A Abrasion of left hand, initial encounter: Secondary | ICD-10-CM | POA: Insufficient documentation

## 2015-05-26 DIAGNOSIS — S80219A Abrasion, unspecified knee, initial encounter: Secondary | ICD-10-CM | POA: Insufficient documentation

## 2015-05-26 MED ORDER — TETANUS-DIPHTH-ACELL PERTUSSIS 5-2.5-18.5 LF-MCG/0.5 IM SUSP
0.5000 mL | INTRAMUSCULAR | Status: DC | PRN
Start: 2015-05-26 — End: 2015-05-27

## 2015-05-26 MED ORDER — ACETAMINOPHEN 500 MG PO TABS
1000.0000 mg | ORAL_TABLET | Freq: Once | ORAL | Status: DC
Start: 2015-05-26 — End: 2015-05-27

## 2015-05-26 NOTE — ED Notes (Signed)
Bed: N S2  Expected date:   Expected time:   Means of arrival: [id number]  Comments:
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
## 2015-05-26 NOTE — Discharge Instructions (Signed)
    Desgarres de piel        Skin Tears      1.   Usted ha sido atendido por un desgarre en su piel.    1.   You have been seen for a tear in your skin.                    2.   Un “desgarre en la piel” es cuando la piel se abre. Generalmente ocurre en personas con piel delgada o frágil. Deben tener el cuidado correcto para evitar que empeore o que no sane. La mayoría de los desgarres en la piel se dan en los brazos y piernas. También pueden suceder en los glúteos o la espalda.     2.   A “skin tear” is when the skin splits open. It usually happens in people with thin or fragile skin. They must get the right care so that they do not get worse or not heal. Most skin tears happen on the arms and legs. They also happen on the buttocks and back.                     3.   Para cuidar de un desgarre en la piel, limpie con cuidado la zona con un limpiador de heridas suave. No frote la herida. Luego, deje que la herida se seque o dé golpecitos con cuidado con un paño suave o una toalla. Utilice un ungüento medicinal como Polysporin® en la zona después de limpiarla. Luego cubra la zona con una venda elástica. El mejor vendaje para este tipo de heridas es uno que no se pegue a la herida. Algunos vendajes que no se pegan son Telfa® y Adaptic®. Mantenga la venda elástica en su lugar con una media o con un material de taponamiento. Si debe usar cinta para mantener el vendaje en su lugar, retírela cuidadosamente con agua. Evite arrancársela de la piel.    3.   To care for a skin tear, gently clean the area with a mild wound cleanser. Do not to rub the wound. Then let the wound air dry or gently pat it with a soft cloth or towel. Use a medicated ointment like Polysporin® on the area after cleaning it. Then cover the area with a bandage. The best dressing for this kind of wound is one that won’t stick to the wound. Some non-stick dressings are Telfa® and Adaptic®. Keep the bandage in place with a clean stocking or dressing. If you  must use tape to keep the bandage in place, remove it gently with water. Avoid ripping it off the skin.                    4.   Algunas cosas que puede hacer para prevenir los desgarres en la piel son:     4.   Some things you can do to prevent skin tears are:         * Use ropa con mangas largas.      * Wear long sleeves.        * Utilice bloqueador solar diariamente. Aplique el bloqueador solar cada cuatro horas cuando está al exterior.      * Wear sunscreen everyday. Apply the sunscreen every four hours when outside.        * Evite permanecer bajo el sol mucho tiempo.      * Avoid staying in the sun too long.        * Utilice una crema hidratante espesa como Aquaphor® (disponible con o sin receta) dos veces al día. Esto le protegerá e hidratará su piel.      * Use a thick moisturizing cream like Aquaphor® (available with or without prescription) two times a day. It will protect and   moisturize your skin.        * No se lave las manos tan a menudo y use agua tibia.       * Do not wash your hands too often and use lukewarm water.         * Beba mucha agua y lleve una dieta saludable, alta en proteína y con suficientes calorías.       * Drink plenty of water and eat a healthy, high-protein diet with enough calories.         * Evite los esteroides en la piel por períodos prolongados de tiempo. Éstos pueden causar que la piel se adelgace. Esto hace a la piel más propensa a los desgarres.      * Avoid steroids on the skin for long periods of time. These can cause the skin to thin. This makes skin tears more likely.                    5.   Visite a su médico de atención primaria para que le revise nuevamente su desgarre de piel.    5.   See your primary care doctor for a recheck of your skin tear.                    6.   DEBE BUSCAR ATENCIÓN MÉDICA INMEDIATAMENTE, AQUÍ O EN LA SALA DE EMERGENCIAS MÁS CERCANA, SI SE PRESENTA CUALQUIERA DE LAS SIGUIENTES SITUACIONES:    6.   YOU SHOULD SEEK MEDICAL ATTENTION IMMEDIATELY,  EITHER HERE OR AT THE NEAREST EMERGENCY DEPARTMENT IF ANY OF THE FOLLOWING OCCUR:        * Tiene fiebre (temperatura mayor de 100.4°F / 38°C) o escalofríos.      * You have fever (temperature higher than 100.4ºF / 38ºC) or chills.        * Hay una zona enrojecida alrededor del desgarre en la piel.      * There is an area of redness around the skin tear.        * Hay líneas rojas que se extienden desde su desgarre en la piel.      * There are streaks coming from your skin tear.        * Secreción amarrilla o verde que sale de su desgarre de piel.      * Yellow or green drainage comes from your skin tear.        * Fuerte dolor en la zona.      * There is severe pain in the area.        * Tiene otras preocupaciones.      * You have other concerns.       

## 2015-11-22 ENCOUNTER — Emergency Department: Payer: Charity

## 2015-11-22 ENCOUNTER — Emergency Department
Admission: EM | Admit: 2015-11-22 | Discharge: 2015-11-22 | Disposition: A | Discharge status: Home / Self Care | Payer: Charity | Attending: Emergency Medicine | Admitting: Emergency Medicine

## 2015-11-22 DIAGNOSIS — Z79899 Other long term (current) drug therapy: Secondary | ICD-10-CM | POA: Insufficient documentation

## 2015-11-22 DIAGNOSIS — M791 Myalgia, unspecified site: Secondary | ICD-10-CM

## 2015-11-22 DIAGNOSIS — Z7984 Long term (current) use of oral hypoglycemic drugs: Secondary | ICD-10-CM | POA: Insufficient documentation

## 2015-11-22 DIAGNOSIS — Z794 Long term (current) use of insulin: Secondary | ICD-10-CM | POA: Insufficient documentation

## 2015-11-22 DIAGNOSIS — Z9114 Patient's other noncompliance with medication regimen: Secondary | ICD-10-CM | POA: Insufficient documentation

## 2015-11-22 DIAGNOSIS — R1110 Vomiting, unspecified: Secondary | ICD-10-CM | POA: Insufficient documentation

## 2015-11-22 DIAGNOSIS — E119 Type 2 diabetes mellitus without complications: Secondary | ICD-10-CM | POA: Insufficient documentation

## 2015-11-22 DIAGNOSIS — J189 Pneumonia, unspecified organism: Secondary | ICD-10-CM | POA: Insufficient documentation

## 2015-11-22 DIAGNOSIS — I10 Essential (primary) hypertension: Secondary | ICD-10-CM | POA: Insufficient documentation

## 2015-11-22 HISTORY — DX: Essential (primary) hypertension: I10

## 2015-11-22 LAB — COMPREHENSIVE METABOLIC PANEL
ALT: 15 U/L (ref 0–55)
AST (SGOT): 13 U/L (ref 5–34)
Albumin/Globulin Ratio: 1.2 (ref 0.9–2.2)
Albumin: 4 g/dL (ref 3.5–5.0)
Alkaline Phosphatase: 112 U/L — ABNORMAL HIGH (ref 37–106)
BUN: 14 mg/dL (ref 7.0–19.0)
Bilirubin, Total: 0.5 mg/dL (ref 0.2–1.2)
CO2: 23 mEq/L (ref 22–29)
Calcium: 10 mg/dL (ref 8.5–10.5)
Chloride: 102 mEq/L (ref 100–111)
Creatinine: 0.7 mg/dL (ref 0.6–1.0)
Globulin: 3.4 g/dL (ref 2.0–3.6)
Glucose: 262 mg/dL — ABNORMAL HIGH (ref 70–100)
Potassium: 3.8 mEq/L (ref 3.5–5.1)
Protein, Total: 7.4 g/dL (ref 6.0–8.3)
Sodium: 137 mEq/L (ref 136–145)

## 2015-11-22 LAB — CBC AND DIFFERENTIAL
Basophils Absolute Automated: 0.05 10*3/uL (ref 0.00–0.20)
Basophils Automated: 0 %
Eosinophils Absolute Automated: 0.25 10*3/uL (ref 0.00–0.70)
Eosinophils Automated: 2 %
Hematocrit: 36.5 % — ABNORMAL LOW (ref 37.0–47.0)
Hgb: 12.6 g/dL (ref 12.0–16.0)
Immature Granulocytes Absolute: 0.03 10*3/uL
Immature Granulocytes: 0 %
Lymphocytes Absolute Automated: 2.78 10*3/uL (ref 0.50–4.40)
Lymphocytes Automated: 26 %
MCH: 28 pg (ref 28.0–32.0)
MCHC: 34.5 g/dL (ref 32.0–36.0)
MCV: 81.1 fL (ref 80.0–100.0)
MPV: 9.5 fL (ref 9.4–12.3)
Monocytes Absolute Automated: 0.53 10*3/uL (ref 0.00–1.20)
Monocytes: 5 %
Neutrophils Absolute: 7.06 10*3/uL (ref 1.80–8.10)
Neutrophils: 66 %
Nucleated RBC: 0 /100 WBC (ref 0–1)
Platelets: 346 10*3/uL (ref 140–400)
RBC: 4.5 10*6/uL (ref 4.20–5.40)
RDW: 14 % (ref 12–15)
WBC: 10.7 10*3/uL (ref 3.50–10.80)

## 2015-11-22 LAB — URINALYSIS, REFLEX TO MICROSCOPIC EXAM IF INDICATED
Bilirubin, UA: NEGATIVE
Blood, UA: NEGATIVE
Glucose, UA: 150 — AB
Ketones UA: 5 — AB
Leukocyte Esterase, UA: NEGATIVE
Nitrite, UA: NEGATIVE
Protein, UR: NEGATIVE
Specific Gravity UA: 1.019 (ref 1.001–1.035)
Urine pH: 7 (ref 5.0–8.0)
Urobilinogen, UA: NORMAL mg/dL

## 2015-11-22 LAB — GFR: EGFR: >60

## 2015-11-22 LAB — POCT PREGNANCY TEST, URINE HCG: POCT Pregnancy HCG Test, UR: NEGATIVE

## 2015-11-22 LAB — LIPASE: Lipase: 25 U/L (ref 8–78)

## 2015-11-22 MED ORDER — NAPROXEN 500 MG PO TABS
500.0000 mg | ORAL_TABLET | Freq: Two times a day (BID) | ORAL | Status: DC
Start: 2015-11-22 — End: 2018-05-30

## 2015-11-22 MED ORDER — ONDANSETRON 4 MG PO TBDP
ORAL_TABLET | ORAL | Status: DC
Start: 2015-11-22 — End: 2018-05-30

## 2015-11-22 MED ORDER — SODIUM CHLORIDE 0.9 % IV BOLUS
1000.0000 mL | Freq: Once | INTRAVENOUS | Status: AC
Start: 2015-11-22 — End: 2015-11-22
  Administered 2015-11-22: 1000 mL via INTRAVENOUS

## 2015-11-22 MED ORDER — AZITHROMYCIN 250 MG PO TABS
250.0000 mg | ORAL_TABLET | Freq: Every day | ORAL | Status: AC
Start: 2015-11-22 — End: 2015-11-26

## 2015-11-22 MED ORDER — ONDANSETRON HCL 4 MG/2ML IJ SOLN
4.0000 mg | Freq: Once | INTRAMUSCULAR | Status: AC
Start: 2015-11-22 — End: 2015-11-22
  Administered 2015-11-22: 4 mg via INTRAVENOUS
  Filled 2015-11-22: qty 2

## 2015-11-22 MED ORDER — TRAMADOL HCL 50 MG PO TABS
50.0000 mg | ORAL_TABLET | Freq: Four times a day (QID) | ORAL | Status: DC | PRN
Start: 2015-11-22 — End: 2018-01-26

## 2015-11-22 MED ORDER — KETOROLAC TROMETHAMINE 30 MG/ML IJ SOLN
30.0000 mg | Freq: Once | INTRAMUSCULAR | Status: AC
Start: 2015-11-22 — End: 2015-11-22
  Administered 2015-11-22: 30 mg via INTRAVENOUS
  Filled 2015-11-22: qty 1

## 2015-11-22 MED ORDER — AZITHROMYCIN 250 MG PO TABS
500.0000 mg | ORAL_TABLET | Freq: Once | ORAL | Status: AC
Start: 2015-11-22 — End: 2015-11-22
  Administered 2015-11-22: 500 mg via ORAL
  Filled 2015-11-22: qty 2

## 2015-11-22 MED ORDER — TRAMADOL HCL 50 MG PO TABS
100.0000 mg | ORAL_TABLET | Freq: Four times a day (QID) | ORAL | Status: DC | PRN
Start: 2015-11-22 — End: 2015-11-22
  Administered 2015-11-22: 100 mg via ORAL
  Filled 2015-11-22: qty 2

## 2015-11-22 NOTE — ED Provider Notes (Signed)
[facility] EMERGENCY DEPARTMENT H&P         CLINICAL SUMMARY          Diagnosis:    .     Final diagnoses:   Myalgia   Vomiting in adult   Pneumonia of right lower lobe due to infectious organism                 Disposition:      ED Disposition     Discharge Kelsey Miles discharge to home/self care.    Condition at disposition: Stable                       CLINICAL INFORMATION        HPI:      Chief Complaint: Generalized Body Aches and Emesis  .    Kelsey Miles is a 35 y.o. female who has a pmhx sig for DM, HTN; presents with 1 week of vomiting and myalgias. A/w bilateral back pain, dry cough, feeling warm.  No fever, diarrhea. Non-compliant with insulin over the last week.    History obtained from: Patient      ROS:      Positive and negative ROS elements as per HPI.   All Other Systems Reviewed and Negative        Physical Exam:      Pulse 100  BP (!) 162/94 mmHg  Resp 18  SpO2 100 %  Temp 98.2 F (36.8 C)    Physical Exam   Nursing note and vitals reviewed.   Constitutional: Pt is oriented to person, place, and time. Pt appears well-developed and well-nourished.  Eyes: Conjunctivae normal . No icterus  ENT: no nasal [place] mmm   Cardiovascular: Normal rate, regular rhythm and normal heart sounds.   Pulmonary/Chest: Effort normal and breath sounds normal. No respiratory distress.   GI: Soft. There is no guarding. There is no tenderness  Musculoskeletal: Normal range of motion. no deformity.   Neurological: Pt is alert and oriented to person, place, and time. GCS eye subscore is 4. GCS verbal subscore is 5. GCS motor subscore is 6. MAE   Skin: Skin is warm and dry.   Psychiatric: Pt has a normal  affect. Pt behavior is normal.               PAST HISTORY        Primary Care Provider: Christa See, MD        PMH/PSH:    .     Past Medical History   Diagnosis Date   . Diabetes    . Diabetes mellitus associated with hormonal etiology 2007   . Gout    . Hypertension        She has past surgical  history that includes Cesarean section (x2) and Cesarean section.      Social/Family History:      She reports that she has never smoked. She does not have any smokeless tobacco history on file. She reports that she does not drink alcohol. Her drug history is not on file.    Family History   Problem Relation Age of Onset   . Diabetes Father    . Diabetes Brother    . Diabetes Sister          Listed Medications on Arrival:    .     Previous Medications    INSULIN LISPRO PROTAMINE-INSULIN LISPRO (HUMALOG 75-25) (75-25) 100 UNIT/ML SUSP  Take 10 units before breakfast  & 10 units before dinner.    INSULIN NPH (HUMULIN,NOVOLIN) 100 UNIT/ML INJECTION    Inject 15 Unit into the skin 2 (two) times daily before meals, in the morning and evening.    INSULIN SYRINGE-NEEDLE U-100 (INSULIN SYRINGE .5CC/31GX5/16") 31G X 5/16" 0.5 ML MISC    Insulin needle    LISINOPRIL (PRINIVIL,ZESTRIL) 5 MG TABLET    Take 1 tablet (5 mg total) by mouth daily.    METFORMIN (GLUCOPHAGE) 1000 MG TABLET    Take 1 tablet (1,000 mg total) by mouth 2 (two) times daily with meals.    METFORMIN (GLUCOPHAGE) 1000 MG TABLET    Take 1,000 mg by mouth 2 (two) times daily with meals.    VITAMIN D, ERGOCALCIFEROL, (DRISDOL) 50000 UNIT CAP    Take 1 capsule (50,000 Units total) by mouth once a week.      Allergies: She has No Known Allergies.            VISIT INFORMATION                    Medications Given in the ED:    .     ED Medication Orders     Start Ordered     Status Ordering Provider    11/22/15 0451 11/22/15 0450  azithromycin (ZITHROMAX) tablet 500 mg   Once     Route: Oral  Ordered Dose: 500 mg     Ordered Lanee Chain E    11/22/15 0327 11/22/15 0327  traMADol (ULTRAM) tablet 100 mg   Every 6 hours PRN     Route: Oral  Ordered Dose: 100 mg     Last MAR action:  Given Rickayla Wieland E    11/22/15 0221 11/22/15 0220  sodium chloride 0.9 % bolus 1,000 mL   Once     Route: Intravenous  Ordered Dose: 1,000 mL     Last MAR action:  Stopped  Donella Pascarella E    11/22/15 0037 11/22/15 0036  sodium chloride 0.9 % bolus 1,000 mL   Once     Route: Intravenous  Ordered Dose: 1,000 mL     Last MAR action:  Stopped Sueko Dimichele E    11/22/15 0037 11/22/15 0036  ondansetron (ZOFRAN) injection 4 mg   Once     Route: Intravenous  Ordered Dose: 4 mg     Last MAR action:  Given Abeera Flannery E    11/22/15 0037 11/22/15 0036  ketorolac (TORADOL) injection 30 mg   Once     Route: Intravenous  Ordered Dose: 30 mg     Last MAR action:  Given Jenilee Franey E            Procedures:            Interpretations:      MDM:     Pulse Ox Analysis interpreted by me is 100% on RA nl without need for supplementation     Cardiac Monitor Analysis interpreted by me - Normal Sinus Rhythm at rate of 95     Labs:I have reviewed and interpreted the labs at the time of visit. Dr Valere Dross    DDX: DKA, hyperglycemia, metabolic acidosis     Clinical Course in the ED:     ? Infiltrate on cxr, vss labs stable . Stable for [place] on po abx           Discharge Prescriptions     Medication Sig  Dispense Auth. Provider    traMADol (ULTRAM) 50 MG tablet Take 1-2 tablets (50-100 mg total) by mouth every 6 (six) hours as needed. 10 tablet Carmon Sails, MD    naproxen (NAPROSYN) 500 MG tablet Take 1 tablet (500 mg total) by mouth 2 (two) times daily with meals. 15 tablet Carmon Sails, MD    azithromycin (ZITHROMAX) 250 MG tablet Take 1 tablet (250 mg total) by mouth daily. 4 tablet Carmon Sails, MD    ondansetron (ZOFRAN ODT) 4 MG disintegrating tablet 1-2 every 6 hrs as needed 10 tablet Carmon Sails, MD                  RESULTS        Lab Results:      Results     Procedure Component Value Units Date/Time    UA, Reflex to Microscopic (pts  3 + yrs) [[id number]]  (Abnormal) Collected:  11/22/15 0052    Specimen Information:  Urine Updated:  11/22/15 0148     Urine Type Clean Catch      Color, UA Yellow      Clarity, UA Cloudy (A)      Specific Gravity UA 1.019      Urine pH 7.0       Leukocyte Esterase, UA Negative      Nitrite, UA Negative      Protein, UR Negative      Glucose, UA 150 (A)      Ketones UA 5 (A)      Urobilinogen, UA Normal mg/dL      Bilirubin, UA Negative      Blood, UA Negative      RBC, UA 0 - 5 /hpf      WBC, UA 0 - 5 /hpf      Squamous Epithelial Cells, Urine 0 - 5 /hpf      Trans Epithel, UA 0 - 2 /hpf      Yeast, UA Rare (A)     Comprehensive metabolic panel [[id number]]  (Abnormal) Collected:  11/22/15 0052    Specimen Information:  Blood Updated:  11/22/15 0124     Glucose 262 (H) mg/dL      BUN 78.2 mg/dL      Creatinine 0.7 mg/dL      Sodium 956 mEq/L      Potassium 3.8 mEq/L      Chloride 102 mEq/L      CO2 23 mEq/L      Calcium 10.0 mg/dL      Protein, Total 7.4 g/dL      Albumin 4.0 g/dL      AST (SGOT) 13 U/L      ALT 15 U/L      Alkaline Phosphatase 112 (H) U/L      Bilirubin, Total 0.5 mg/dL      Globulin 3.4 g/dL      Albumin/Globulin Ratio 1.2     Lipase [[id number]] Collected:  11/22/15 0052    Specimen Information:  Blood Updated:  11/22/15 0124     Lipase 25 U/L     GFR [[id number]] Collected:  11/22/15 0052     EGFR >60.0 Updated:  11/22/15 0124    CBC with differential [[id number]]  (Abnormal) Collected:  11/22/15 0052    Specimen Information:  Blood from Blood Updated:  11/22/15 0113     WBC 10.70 x10 3/uL      Hgb 12.6 g/dL      Hematocrit 27.2 (L) %  Platelets 346 x10 3/uL      RBC 4.50 x10 6/uL      MCV 81.1 fL      MCH 28.0 pg      MCHC 34.5 g/dL      RDW 14 %      MPV 9.5 fL      Neutrophils 66 %      Lymphocytes Automated 26 %      Monocytes 5 %      Eosinophils Automated 2 %      Basophils Automated 0 %      Immature Granulocyte 0 %      Nucleated RBC 0 /100 WBC      Neutrophils Absolute 7.06 x10 3/uL      Abs Lymph Automated 2.78 x10 3/uL      Abs Mono Automated 0.53 x10 3/uL      Abs Eos Automated 0.25 x10 3/uL      Absolute Baso Automated 0.05 x10 3/uL      Absolute Immature Granulocyte 0.03 x10 3/uL     Urine HCG, POC/ Qualitative  [[id number]] Collected:  11/22/15 0052    Specimen Information:  Urine Updated:  11/22/15 0053     POCT QC Pass      POCT Pregnancy HCG Test, UR Negative      Comment:        Result:        Negative Value is Normal in Healthy Males or Healthy non-pregnant Females              Radiology Results:      CT Abd/Pelvis without Contrast   Final Result   . No renal calculi no hydronephrosis.   2. No other apparent inflammatory process on this noncontrast exam      Marty Heck, MD    11/22/2015 4:38 AM         XR Chest 2 Views   Final Result   . Right basilar infiltrate consistent with pneumonia.   Recommend follow-up to assure resolution      Marty Heck, MD    11/22/2015 2:55 AM                     Scribe Attestation:      I was acting as a Neurosurgeon for Carmon Sails, MD on [facility] Miles  Treatment Team: Scribe: Particia Lather   I am the first provider for this patient and I personally performed the services documented. Treatment Team: Scribe: Particia Lather is scribing for me on Kelsey Miles,Kelsey Miles. This note accurately reflects work and decisions made by me.  Carmon Sails, MD           Carmon Sails, MD  11/26/15 [phone]

## 2015-11-22 NOTE — Discharge Instructions (Signed)
Dear Kelsey Miles:    Thank you for choosing the [facility], the premier emergency department in the [place] area.  I hope your visit today was EXCELLENT.    Specific instructions for your visit today:     Clinics: [facility] [place].    FREE CLINICS  (most have residency or income requirements)    [place]  [facility]  (must be a [facility] resident for 9 months)  [place] - [phone]  Eilleen Kempf - [address] [place] area) - [phone]  [facility] available at [facility] only    [facility] - [phone]  Clinic in [facility] in [facility] ([address] [place]) [place] [phone]  [email or web address]; $45.00 fee    [address], [place], [place] - [phone]    [facility]  Adult Medicine and [facility] - [phone]  Pediatrics - [phone]  Family Support & Mental Health Services - [phone]    [facility] [place] [phone] [facility] [address] [phone]  [email or web address]; $20 initial visit; dental hygiene available    [facility] [phone]    [facility]  [place] [phone]  [email or web address]; must be an [facility] resident for 1 year    [facility] - [phone]      [place]    [facility]  Must live in [place]  [place] [phone].) - [phone]  open Thursdays 4:30PM-9:00PM (may line up at 4:15PM)  dental clinic available  Faythe Dingwall [phone] [facility] [address] - [phone]  open Tuesdays 4:30PM-9:00PM    [facility] [phone]  Must live in [place]                      Mialgia     Myalgia     1.  Usted ha sido diagnosticado con dolores  musculares (mialgia).   1.  You have been diagnosed with muscle aches (myalgia).             2.  La inflamacin (irritacin) de los msculos causa mialgias. Esto causa dolor. Generalmente, esto ocurre cuando el msculo afectado ha sido usado de manera excesiva o se ha lesionado. En ocasiones, la causa es un padecimiento viral o una enfermedad autoinmune. Existen muchas otras causas posibles.   2.  Inflammation (irritation) of the muscles causes myalgias. This causes pain. Usually, this happens when the affected muscle is over-used or injured. Sometimes, the cause is a viral illness, or an autoimmune disease. There are many other possiblecauses.             3.  Neomia Dear causa posible es debido a una extraa reaccin a los frmacos llamados "estatinas". Estos medicamentos reducen Kelsey Miles de colesterol. En casos raros, causan dolor muscular o incluso destruccin muscular. Los sntomas [employer], sensibilidad o debilidad.   3.  One possible cause is due to a rare reaction to drugs called "statins." These drugs lower cholesterol. In rare cases, they cause muscle pain or even muscle breakdown. Symptoms include muscle aches, soreness, tenderness, or weakness.             4.  Las estatinas incluyen algunos de los siguientes medicamentos:    4.  Statins include some of the following medications:         *  Atorvastatina (Lipitor); fluvastatina (Lescol); lovastatina (Mevacor, Altoprev); pitavastatina (Livalo); pravastatina (Pravachol); rosuvastatina (Crestor); simvastatina (Zocor); otros pueden estar disponibles.    * Atorvastatin (Lipitor); Luvastatin (Lescol); lovastatin (Mevacor, Altoprev); pitavastatin (Livalo); pravastatin (Pravachol); rosuvastatin (Crestor); simvastatin (Zocor); others may become available.      * Combinaciones de medicamentos como simvastatina con ezetimibe (Vytorin).    * Combination medications like simvastatin/ezetimibe  (Vytorin).             5.  Su mdico ha decidido, en base al examen que se le practic hoy, que la causa de los dolores musculares no pone en riesgo su vida ni es peligrosa. Dependiendo de la causa de su dolor, puede esperar a que sus sntomas desaparezcan durante la siguiente semana. En ocasiones, los sntomas pueden durar hasta por unas semanas.   5.  Your doctor has decided, based on your exam today, that the cause of the muscle pains is not life-threatening or dangerous. Depending on the cause for your pain, you can expect your symptoms to get better over the next week. Sometimes the symptoms can last up to a few weeks.             6.  No creemos que su condicin sea peligrosa en este momento. Sin embargo, debe tener mucho cuidado. A veces, un problema que parece pequeo puede convertirse en algo serio despus. Por lo tanto, es muy importante que regrese aqu o que acuda a la sala de emergencias ms cercana si no mejora o si sus sntomas empeoran.    6.  We don't believe your condition is dangerous right now. However, you need to be careful. Sometimes a problem that seems small can get serious later. Therefore, it is very important for you to come back here or go to the nearest Emergency Department if you don t get better or your symptoms get worse.              7.  Su mdico puede recetarle analgsicos para tratar su dolor. Tambin puede usar medicamentos de venta libre como acetaminofeno (Tylenol) o medicamentos antiinflamatorios como ibuprofeno (Advil, Motrin) o naproxeno (Aleve, Naprosyn. Es importante seguir las instrucciones para tomar [employer].   7.  Your doctor may prescribe you pain medications to treat yourpain. You can also use over-the-counter medicines like acetaminophen (Tylenol) or anti-inflammatory medicinelike ibuprofen (Advil, Motrin) or Naproxen (Aleve, Naprosyn). It is important to follow the directions for taking these medications.              8.  Algunas cosas que puede hacer para ayudar a su lesin son: reposo, aplicar hielo, aplicar compresiones y elevar la zona lesionada. Todo se resume a reposo, hielo, compresin y elevacin.   8.  Some things you can do to help your injury are: Resting, Icing, Compressing and Elevating the injured area. Remember this as "RICE."        * REPOSO: limite el movimiento de la parte adolorida del cuerpo.     * REST: Limit the use of the painful body part.         * HIELO: al aplicar hielo en el rea afectada, se pueden reducir el dolor y la hinchazn. Coloque cubitos de hielo con un poco de agua en una bolsita hermtica (tipo Ziploc). Ponga una toallita entre la bolsa y su piel. Aplique esta bolsa de hielo sobre el rea al menos por 20 minutos. Haga esto al menos 4 veces al da. Est bien si lo hace con ms frecuencia de  la indicada. Tambin lo puede hacer por ms tiempo del indicado. NUNCA APLIQUE HIELO DIRECTAMENTE SOBRE LA PIEL.     * ICE: By applying ice to the affected area, swelling and pain can be reduced. Place some ice cubes in a re-sealable (Ziploc) bag and add some water. Put a thin washcloth between the bag and the skin. Apply the ice bag to the area for at least 20 minutes. Do this at least 4 times per day. It is okay to do this more often than directed. You can also do it for longer than directed. NEVER APPLY ICE DIRECTLY TO THE SKIN.         * COMPRESIN: compresin significa aplicar presin sobre la zona adolorida, como con un entablillado, un yeso o una venda elstica. La compresin disminuye la hinchazn y le brinda comodidad. La compresin debera ser la suficiente para Physiological scientist, pero no debe estar tan apretada que reduzca la circulacin. Un dolor en aumento, entumecimiento, hormigueo o cambios en el color de la piel son sntomas de disminucin de Field seismologist.     * COMPRESS: Compression means to apply pressure around the painful area such as with a splint,  cast or an ace bandage. Compression decreases swelling and improves comfort. Compression should be tight enough to relieve swelling but not so tight as to decrease circulation. Increasing pain, numbness, tingling, or change in skin color, are all signs of decreased circulation.         * ELEVACIN: eleve la zona adolorida.     * ELEVATE: Elevate the painful part.              9.  Cuando su dolor comience a disminuir, Economist cuidadosamente estiramientos con el msculo lesionado y trabajar para incrementar su rango de movimiento. Esto ayudar a evitar que sus msculos se pongan rgidos y [employer] sntomas duren menos.   9.  When your pain starts to get better, you ll need to do gentle stretches with the injured muscle and work on increasing your range of motion. This will help your muscles from getting stiff and make the symptoms not last as long.             10.  DEBE BUSCAR ATENCIN MDICA INMEDIATAMENTE, AQU O EN LA SALA DE EMERGENCIAS MS CERCANA, SI SE PRESENTA CUALQUIERA DE LAS SIGUIENTES SITUACIONES:   10.  YOU SHOULD SEEK MEDICAL ATTENTION IMMEDIATELY, EITHER HERE OR AT THE NEAREST EMERGENCY DEPARTMENT, IF ANY OF THE FOLLOWING OCCUR:      * Si sus sntomas no han comenzado a Geneticist, molecular en 5-10 das.    * Your symptoms haven t started to get better in 5-10 days.      * Si empieza a Insurance claims handler parte del cuerpo afectada, o si esta se pone plida, entumecida y [address] al tacto.    * You start to have severe pain in the affected body part or the body part becomes pale, numb, and very firm to the touch.      * Si su orina es de Scientist, physiological. Esto puede ser una seal de destruccin muscular.    * Your urine (pee) is the wrong color. This can be a sign of muscle breakdown.             11.  Si no puede dar seguimiento con su mdico, o si en cualquier momento cree que necesita una nueva revisin o ser atendido de [place],  venga aqu o  acuda a la sala de emergencias ms cercana.   11.  If you can t follow up with your doctor, or if at any time you feel you need to be rechecked or seen again, come back here or go to the nearest emergency department.                           Deshidratacin     Dehydration     1.  Usted ha sido diagnosticado con deshidratacin.   1.  You have been diagnosed with dehydration.             2.  La deshidratacin se presenta cuando su cuerpo tiene un bajo nivel de lquidos. La deshidratacin tiene una variedad de causas. Estas van desde el vmito y la diarrea, hasta sudoracin excesiva y mal apetito.   2.  Dehydration is when your body is low on fluids (liquids). Dehydration has a variety of causes. These range from vomiting and diarrhea, to excessive (a lot of) sweating and poor appetite.             3.  Usted ha recibido lquidos por [place] intravenosa (IV). Estos le ayudarn a solucionar su problema de deshidratacin. Es importante que se mantenga hidratado en casa. Beba bastantes lquidos con frecuencia. Beba lquidos que no le caigan mal al [employer]. Estos incluyen agua y [place]. Tambin incluye bebidas como Gatorade. Evite bebidas como refrescos gaseosos y ts. Estos pueden empeorar su condicin. Evite la cafena y el alcohol. Estos pueden hacer que pierda ms lquidos.   3.  You have received intravenous (IV) fluids. These are to help fix your dehydration. It is important to keep hydrating yourself at home. Drink plenty of fluids frequently. Drink fluids that won t upset your stomach. This includes water and juice. It also includes drinks like Gatorade. Stay away from beverages like soda pop and tea. They may make you worse. Avoid caffeine and alcohol. They may cause you to lose more fluid.             4.  DEBE BUSCAR ATENCIN MDICA INMEDIATAMENTE, AQU O EN LA SALA DE EMERGENCIAS MS CERCANA, SI SE PRESENTA CUALQUIERA DE LAS SIGUIENTES SITUACIONES:   4.  YOU SHOULD SEEK  MEDICAL ATTENTION IMMEDIATELY, EITHER HERE OR AT THE NEAREST EMERGENCY DEPARTMENT, IF ANY OF THE FOLLOWING OCCURS:      * Si tiene fiebre (temperatura mayor de 100.80F/38C) o escalofros.    * Fever (temperature higher than 100.80F / 38C) or shaking chills.      * Si tiene vmitos y/o Psychiatric nurse.    * Constant vomiting and/or diarrhea.      * Si tiene mareos o [employer].    * Lightheadedness or fainting.      * Si no orina (no hace pip) por 8 horas o ms.    * If you do not urinate (pee) for 8 or more hours.                     If you do not continue to improve or your condition worsens, please contact your doctor or return immediately to the Emergency Department.    Sincerely,  Kopack, Mont Dutton, MD  Attending Emergency Physician  [facility]    ONSITE PHARMACY  Our full service onsite pharmacy is located in the ER waiting room.  Open 7 days a week from 9 am to  11 pm.  We accept all major insurances and prices are competitive with major retailers.  Ask your provider to print your prescriptions down to the pharmacy to speed you on your way home.    OBTAINING A PRIMARY CARE APPOINTMENT    Primary care physicians (PCPs, also known as primary care doctors) are either internists or family medicine doctors. Both types of PCPs focus on health promotion, disease prevention, patient education and counseling, and treatment of acute and chronic medical conditions.    Call for an appointment with a primary care doctor.  Ask to see who is taking new patients.     [facility]  telephone:  [phone]  [email or web address]    DOCTOR REFERRALS  Call [phone] (available 24 hours a day, 7 days a week) if you need any further referrals and we can help you find a primary care doctor or specialist.  Also, available online at:  [email or web address]    YOUR CONTACT INFORMATION  Before leaving please check with registration to make sure we have an  up-to-date contact number.  You can call registration at [phone] to update your information.  For questions about your hospital bill, please call [phone].  For questions about your Emergency Dept Physician bill please call [phone].      FREE HEALTH SERVICES  If you need help with health or social services, please call 2-1-1 for a free referral to resources in your area.  2-1-1 is a free service connecting people with information on health insurance, free clinics, pregnancy, mental health, dental care, food assistance, housing, and substance abuse counseling.  Also, available online at:  http://www.211[place].org    MEDICAL RECORDS AND TESTS  Certain laboratory test results do not come back the same day, for example urine cultures.   We will contact you if other important findings are noted.  Radiology films are often reviewed again to ensure accuracy.  If there is any discrepancy, we will notify you.      Please call [phone] to pick up a complimentary CD of any radiology studies performed.  If you or your doctor would like to request a copy of your medical records, please call [phone].      ORTHOPEDIC INJURY   Please know that significant injuries can exist even when an initial x-ray is read as normal or negative.  This can occur because some fractures (broken bones) are not initially visible on x-rays.  For this reason, close outpatient follow-up with your primary care doctor or bone specialist (orthopedist) is required.    MEDICATIONS AND FOLLOWUP  Please be aware that some prescription medications can cause drowsiness.  Use caution when driving or operating machinery.    The examination and treatment you have received in our Emergency Department is provided on an emergency basis, and is not intended to be a substitute for your primary care physician.  It is important that your doctor checks you again and that you report any new or remaining problems at that time.      24 HOUR  PHARMACIES  The nearest 24 hour pharmacy is:    [facility]  [address]  [place], [place] [address]  [phone]      ASSISTANCE WITH INSURANCE    Affordable Care Act  [facility])  Call to start or finish an application, compare plans, enroll or ask a question.  [phone]  TTY: [phone]  Web:  Healthcare.gov    Help Enrolling in [facility]  [place]  [phone])  3058538821 (TOLL-FREE)  9844203871 (TTY)  Web:  Http://www.coverva.org    Local Help Enrolling in the [facility]  [place] [facility]  272-135-7759 (MAIN)  Email:  health-help@nvfs .org  Web:  BlackjackMyths.is  Address:  [address] [place], [place] [address]    SEDATING MEDICATIONS  Sedating medications include strong pain medications (e.g. narcotics), muscle relaxers, benzodiazepines (used for anxiety and as muscle relaxers), Benadryl/diphenhydramine and other antihistamines for allergic reactions/itching, and other medications.  If you are unsure if you have received a sedating medication, please ask your physician or nurse.  If you received a sedating medication: DO NOT drive a car. DO NOT operate machinery. DO NOT perform jobs where you need to be alert.  DO NOT drink alcoholic beverages while taking this medicine.     If you get dizzy, sit or lie down at the first signs. Be careful going up and down stairs.  Be extra careful to prevent falls.     Never give this medicine to others.     Keep this medicine out of reach of children.     Do not take or save old medicines. Throw them away when outdated.     Keep all medicines in a cool, dry place. DO NOT keep them in your bathroom medicine cabinet or in a cabinet above the stove.    MEDICATION REFILLS  Please be aware that we cannot refill any prescriptions through the ER. If you need further treatment from what is provided at your ER visit, please follow up with your primary care doctor or your pain management specialist.    FREESTANDING EMERGENCY  DEPARTMENTS OF [place] [facility]  Did you know [facility] has two freestanding ERs located just a few miles away?  [facility] [place] and [place] [facility] [place] have short wait times, easy free parking directly in front of the building and top patient satisfaction scores - and the same Board Certified Emergency Medicine doctors as [facility].              [place] Kambryn Dapolito  [id number]  [id number]  [phone]  11/22/2015    Discharge Instructions    As always, you are the most important factor in your recovery.  Please follow these instructions carefully.  If you have problems that we have not discussed, CALL OR VISIT YOUR DOCTOR RIGHT AWAY.     If you can't reach your doctor, return to the emergency department.    I Alinda Money Miles understand the written and discussed instructions.  My questions have been answered.  I acknowledge receipt of these instructions.     Patient or responsible person:         Patient's Signature               Physician or Nurse

## 2015-11-22 NOTE — ED Notes (Signed)
Bed: S 5  Expected date:   Expected time:   Means of arrival: [id number] - [place] [facility]  Comments:

## 2016-01-19 ENCOUNTER — Emergency Department: Payer: Self-pay

## 2016-01-19 ENCOUNTER — Emergency Department
Admission: EM | Admit: 2016-01-19 | Discharge: 2016-01-19 | Disposition: A | Discharge status: Home / Self Care | Payer: Self-pay | Attending: Emergency Medicine | Admitting: Emergency Medicine

## 2016-01-19 DIAGNOSIS — I10 Essential (primary) hypertension: Secondary | ICD-10-CM | POA: Insufficient documentation

## 2016-01-19 DIAGNOSIS — N390 Urinary tract infection, site not specified: Secondary | ICD-10-CM | POA: Insufficient documentation

## 2016-01-19 DIAGNOSIS — R1013 Epigastric pain: Secondary | ICD-10-CM | POA: Insufficient documentation

## 2016-01-19 DIAGNOSIS — R112 Nausea with vomiting, unspecified: Secondary | ICD-10-CM | POA: Insufficient documentation

## 2016-01-19 DIAGNOSIS — E119 Type 2 diabetes mellitus without complications: Secondary | ICD-10-CM | POA: Insufficient documentation

## 2016-01-19 LAB — COMPREHENSIVE METABOLIC PANEL
ALT: 28 U/L (ref 0–55)
AST (SGOT): 25 U/L (ref 5–34)
Albumin/Globulin Ratio: 1.1 (ref 0.9–2.2)
Albumin: 4.3 g/dL (ref 3.5–5.0)
Alkaline Phosphatase: 114 U/L — ABNORMAL HIGH (ref 37–106)
Anion Gap: 12 (ref 5.0–15.0)
BUN: 15 mg/dL (ref 7.0–19.0)
Bilirubin, Total: 0.5 mg/dL (ref 0.2–1.2)
CO2: 20 mEq/L — ABNORMAL LOW (ref 22–29)
Calcium: 9.7 mg/dL (ref 8.5–10.5)
Chloride: 104 mEq/L (ref 100–111)
Creatinine: 0.8 mg/dL (ref 0.6–1.0)
Globulin: 3.8 g/dL — ABNORMAL HIGH (ref 2.0–3.6)
Glucose: 218 mg/dL — ABNORMAL HIGH (ref 70–100)
Potassium: 4 mEq/L (ref 3.5–5.1)
Protein, Total: 8.1 g/dL (ref 6.0–8.3)
Sodium: 136 mEq/L (ref 136–145)

## 2016-01-19 LAB — CBC AND DIFFERENTIAL
Basophils Absolute Automated: 0.03 10*3/uL (ref 0.00–0.20)
Basophils Automated: 0 %
Eosinophils Absolute Automated: 0.25 10*3/uL (ref 0.00–0.70)
Eosinophils Automated: 3 %
Hematocrit: 39.8 % (ref 37.0–47.0)
Hgb: 13.4 g/dL (ref 12.0–16.0)
Immature Granulocytes Absolute: 0.01 10*3/uL
Immature Granulocytes: 0 %
Lymphocytes Absolute Automated: 2.97 10*3/uL (ref 0.50–4.40)
Lymphocytes Automated: 36 %
MCH: 27.8 pg — ABNORMAL LOW (ref 28.0–32.0)
MCHC: 33.7 g/dL (ref 32.0–36.0)
MCV: 82.6 fL (ref 80.0–100.0)
MPV: 9.6 fL (ref 9.4–12.3)
Monocytes Absolute Automated: 0.5 10*3/uL (ref 0.00–1.20)
Monocytes: 6 %
Neutrophils Absolute: 4.57 10*3/uL (ref 1.80–8.10)
Neutrophils: 55 %
Nucleated RBC: 0 /100 WBC (ref 0–1)
Platelets: 382 10*3/uL (ref 140–400)
RBC: 4.82 10*6/uL (ref 4.20–5.40)
RDW: 13 % (ref 12–15)
WBC: 8.32 10*3/uL (ref 3.50–10.80)

## 2016-01-19 LAB — URINE BHCG POC: Urine bHCG POC: NEGATIVE

## 2016-01-19 LAB — URINALYSIS, REFLEX TO MICROSCOPIC EXAM IF INDICATED
Bilirubin, UA: NEGATIVE
Glucose, UA: NEGATIVE
Ketones UA: NEGATIVE
Nitrite, UA: POSITIVE — AB
Protein, UR: 100 — AB
Specific Gravity UA: 1.023 (ref 1.001–1.035)
Urine pH: 6 (ref 5.0–8.0)
Urobilinogen, UA: NEGATIVE mg/dL

## 2016-01-19 LAB — HEMOLYSIS INDEX: Hemolysis Index: 37 — ABNORMAL HIGH (ref 0–18)

## 2016-01-19 LAB — GFR: EGFR: >60

## 2016-01-19 LAB — HCG QUANTITATIVE: hCG, Quant.: <1.2

## 2016-01-19 LAB — LIPASE: Lipase: 26 U/L (ref 8–78)

## 2016-01-19 MED ORDER — CEPHALEXIN 500 MG PO CAPS
500.0000 mg | ORAL_CAPSULE | Freq: Two times a day (BID) | ORAL | Status: AC
Start: 2016-01-19 — End: 2016-01-26

## 2016-01-19 MED ORDER — SODIUM CHLORIDE 0.9 % IV BOLUS
1000.0000 mL | Freq: Once | INTRAVENOUS | Status: AC
Start: 2016-01-19 — End: 2016-01-19
  Administered 2016-01-19: 1000 mL via INTRAVENOUS

## 2016-01-19 MED ORDER — ONDANSETRON HCL 4 MG/2ML IJ SOLN
4.0000 mg | Freq: Once | INTRAMUSCULAR | Status: AC
Start: 2016-01-19 — End: 2016-01-19
  Administered 2016-01-19: 4 mg via INTRAVENOUS
  Filled 2016-01-19: qty 2

## 2016-01-19 NOTE — ED Provider Notes (Signed)
EMERGENCY DEPARTMENT HISTORY AND PHYSICAL EXAM     Physician/Midlevel provider first contact with patient: 01/19/16 1906         Date: 01/19/2016  Patient Name: Kelsey Miles    History of Presenting Illness     Chief Complaint   Patient presents with   . Abdominal Pain   . Emesis   . Diarrhea     History Provided By: Patient    Chief Complaint: Abd pain  Onset: 3 days ago  Timing: Gradual  Location: Upper abd   Severity: 8/10 in severity  Modifying factors: Worse with certain foods, Motrin with little relief  Associated Symptoms: Nausea, vomiting, soft stools  Pertinent Negatives: fever, chest pain, SOB    Additional History: Kelsey Miles is a 35 y.o. female c/o gradually worsening upper abd pain that started 3 days ago. Associated symptoms include nausea, vomiting, and soft stools. Pt states the pain is worse with certain foods, she took Motrin with little relief. She went to her PCP today and was recommended to come to the ER to r/o pancreatitis and cholecystitis. No fever, chest pain, SOB.     PCP: Pcp, Noneorunknown, MD      No current facility-administered medications for this encounter.     Current Outpatient Prescriptions   Medication Sig Dispense Refill   . cephalexin (KEFLEX) 500 MG capsule Take 1 capsule (500 mg total) by mouth 2 (two) times daily. 14 capsule 0   . insulin lispro protamine-insulin lispro (HUMALOG 75-25) (75-25) 100 UNIT/ML SUSP Take 10 units before breakfast  & 10 units before dinner. 10 mL 1   . insulin NPH (HUMULIN,NOVOLIN) 100 UNIT/ML injection Inject 15 Unit into the skin 2 (two) times daily before meals, in the morning and evening. 10 mL 0   . Insulin Syringe-Needle U-100 (INSULIN SYRINGE .5CC/31GX5/16") 31G X 5/16" 0.5 ML Misc Insulin needle 100 each 0   . lisinopril (PRINIVIL,ZESTRIL) 5 MG tablet Take 1 tablet (5 mg total) by mouth daily. 30 tablet 4   . metFORMIN (GLUCOPHAGE) 1000 MG tablet Take 1 tablet (1,000 mg total) by mouth 2 (two) times daily with meals. 60 tablet  4   . metFORMIN (GLUCOPHAGE) 1000 MG tablet Take 1,000 mg by mouth 2 (two) times daily with meals.     . naproxen (NAPROSYN) 500 MG tablet Take 1 tablet (500 mg total) by mouth 2 (two) times daily with meals. 15 tablet 0   . ondansetron (ZOFRAN ODT) 4 MG disintegrating tablet 1-2 every 6 hrs as needed 10 tablet 0   . traMADol (ULTRAM) 50 MG tablet Take 1-2 tablets (50-100 mg total) by mouth every 6 (six) hours as needed. 10 tablet 0   . Vitamin D, Ergocalciferol, (DRISDOL) 50000 UNIT Cap Take 1 capsule (50,000 Units total) by mouth once a week. 7 capsule 0       Past History     Past Medical History:  Past Medical History   Diagnosis Date   . Diabetes    . Diabetes mellitus associated with hormonal etiology 2007   . Gout    . Hypertension        Past Surgical History:  Past Surgical History   Procedure Laterality Date   . Cesarean section  x2   . Cesarean section         Family History:  Family History   Problem Relation Age of Onset   . Diabetes Father    . Diabetes Brother    .  Diabetes Sister        Social History:  Social History   Substance Use Topics   . Smoking status: Never Smoker    . Smokeless tobacco: None   . Alcohol Use: No       Allergies:  No Known Allergies    Review of Systems     Review of Systems   Constitutional: Negative for fever and chills.   HENT: Negative for congestion and rhinorrhea.    Eyes: Negative for discharge and redness.   Respiratory: Negative for chest tightness, shortness of breath and wheezing.    Cardiovascular: Negative for chest pain and palpitations.   Gastrointestinal: Positive for nausea, vomiting and abdominal pain.   Genitourinary: Negative for dysuria and hematuria.   Musculoskeletal: Negative for back pain, neck pain and neck stiffness.   Skin: Negative for rash and wound.   Neurological: Negative for dizziness, weakness, numbness and headaches.   Psychiatric/Behavioral: Negative for confusion.     Physical Exam   BP 149/99 mmHg  Pulse 103  Temp(Src) 98.1 F (36.7  C) (Oral)  Resp 18  Wt 56.79 kg  SpO2 100%    Physical Exam   Constitutional: She is oriented to person, place, and time. She appears well-developed and well-nourished. No distress.   HENT:   Head: Normocephalic and atraumatic.   Eyes: Conjunctivae are normal. No scleral icterus.   Neck: Normal range of motion. No JVD present.   Cardiovascular: Normal rate, regular rhythm, normal heart sounds and intact distal pulses.  Exam reveals no gallop and no friction rub.    No murmur heard.  Pulmonary/Chest: Effort normal and breath sounds normal. No respiratory distress. She has no wheezes.   Abdominal: Soft. Bowel sounds are normal. She exhibits no distension and no mass. There is tenderness in the right upper quadrant and epigastric area. There is no rebound and no guarding.   Musculoskeletal: Normal range of motion. She exhibits no edema or tenderness.   Neurological: She is alert and oriented to person, place, and time.   Skin: Skin is warm and dry. No rash noted. She is not diaphoretic.   Psychiatric: She has a normal mood and affect. Her behavior is normal.     Diagnostic Study Results     Labs -     Results     Procedure Component Value Units Date/Time    Urine Culture [[id number]] Collected:  01/19/16 1933    Specimen Information:  Urine from Urine, Clean Catch Updated:  01/19/16 2159    UA, Reflex to Microscopic (pts  3 + yrs) [[id number]]  (Abnormal) Collected:  01/19/16 1933    Specimen Information:  Urine Updated:  01/19/16 2149     Urine Type Clean Catch      Color, UA Yellow      Clarity, UA Hazy      Specific Gravity UA 1.023      Urine pH 6.0      Leukocyte Esterase, UA Small (A)      Nitrite, UA Positive (A)      Protein, UR 100 (A)      Glucose, UA Negative      Ketones UA Negative      Urobilinogen, UA Negative mg/dL      Bilirubin, UA Negative      Blood, UA Large (A)      RBC, UA TNTC (A) /hpf      WBC, UA 11 - 25 (A) /hpf      Squamous  Epithelial Cells, Urine 0 - 5 /hpf      Yeast, UA Rare (A)       Urine Mucus Present     Urine BHCG POC [[id number]] Collected:  01/19/16 2140     Urine bHCG POC Negative Updated:  01/19/16 2144    Beta HCG Quant Serum [[id number]] Collected:  01/19/16 1933     hCG, Quant. <1.2 Updated:  01/19/16 2039    Lipase [[id number]] Collected:  01/19/16 1933    Specimen Information:  Blood Updated:  01/19/16 2013     Lipase 26 U/L     Hemolysis index [[id number]]  (Abnormal) Collected:  01/19/16 1933     Hemolysis Index 37 (H) Updated:  01/19/16 2013    GFR [[id number]] Collected:  01/19/16 1933     EGFR >60.0 Updated:  01/19/16 2013    Comprehensive metabolic panel [[id number]]  (Abnormal) Collected:  01/19/16 1933    Specimen Information:  Blood Updated:  01/19/16 2013     Glucose 218 (H) mg/dL      BUN 64.3 mg/dL      Creatinine 0.8 mg/dL      Sodium 329 mEq/L      Potassium 4.0 mEq/L      Chloride 104 mEq/L      CO2 20 (L) mEq/L      Calcium 9.7 mg/dL      Protein, Total 8.1 g/dL      Albumin 4.3 g/dL      AST (SGOT) 25 U/L      ALT 28 U/L      Alkaline Phosphatase 114 (H) U/L      Bilirubin, Total 0.5 mg/dL      Globulin 3.8 (H) g/dL      Albumin/Globulin Ratio 1.1      Anion Gap 12.0     CBC with differential [[id number]]  (Abnormal) Collected:  01/19/16 1933    Specimen Information:  Blood from Blood Updated:  01/19/16 1955     WBC 8.32 x10 3/uL      Hgb 13.4 g/dL      Hematocrit 63.0 %      Platelets 382 x10 3/uL      RBC 4.82 x10 6/uL      MCV 82.6 fL      MCH 27.8 (L) pg      MCHC 33.7 g/dL      RDW 13 %      MPV 9.6 fL      Neutrophils 55 %      Lymphocytes Automated 36 %      Monocytes 6 %      Eosinophils Automated 3 %      Basophils Automated 0 %      Immature Granulocyte 0 %      Nucleated RBC 0 /100 WBC      Neutrophils Absolute 4.57 x10 3/uL      Abs Lymph Automated 2.97 x10 3/uL      Abs Mono Automated 0.50 x10 3/uL      Abs Eos Automated 0.25 x10 3/uL      Absolute Baso Automated 0.03 x10 3/uL      Absolute Immature Granulocyte 0.01 x10 3/uL           Radiologic Studies -    Radiology Results (24 Hour)     Procedure Component Value Units Date/Time    US Abdomen Complete [[id number]] Collected:  01/19/16 2048    Order Status:  Completed Updated:  01/19/16 2056  Narrative:      US ABDOMEN COMPLETE 01/19/2016  Accession # [id number]    History: Abdominal pain     Comparison: Right upper quadrant ultrasound 07/05/2011     FINDINGS:    Liver:  Size: Normal  Craniocaudal length: 13.1 cm   Echogenicity: Normal   Surface nodularity: None   Masses: None   Vessels: Normal    Bile ducts  Intrahepatic ducts: Normal  Common bile duct diameter: 2 mm     Gallbladder   Stones: None   Sludge: None   Wall: Normal   Pericholecystic fluid: None     Pancreas:  Head and tail: Obscured by bowel gas   Body: Unremarkable     Spleen:   Echotexture: Normal  Masses/cysts: None  Size: Normal   Length: 7.8 cm     Right kidney:   Cortex: Normal  Hydronephrosis: None   Masses/cysts/stones: None  Length: 10.4 cm     Left kidney:   Cortex: Normal   Hydronephrosis: None   Masses/cysts/stones: None  Length: 10.4 cm     Abdominal aorta and IVC: Visualized portions are normal     Ascites: None       Impression:          Unremarkable right upper quadrant ultrasound. Negative for  cholelithiasis.     James Ivanoff, MD   01/19/2016 8:52 PM        .    Medical Decision Making   I am the first provider for this patient.    I reviewed the vital signs, available nursing notes, past medical history, past surgical history, family history and social history.    Vital Signs-Reviewed the patient's vital signs.     Patient Vitals for the past 12 hrs:   BP Temp Pulse Resp   01/19/16 2157 (!) 149/99 mmHg - (!) 103 18   01/19/16 2145 (!) 143/91 mmHg 98.1 F (36.7 C) (!) 101 20   01/19/16 1942 149/90 mmHg - 100 16   01/19/16 1853 (!) 142/91 mmHg 98.2 F (36.8 C) (!) 117 20       Pulse Oximetry Analysis - Normal 100% on RA    Old Medical Records: Old medical records.  Nursing notes.     Per family center referral, 3 days of epigastric  and ruq tenderness, concern for cholecystitis and pancreatitis    ED Course:   9:53 PM - Pt is resting in no acute distress and feeling better after IVF and Zofran. Discussed results of labs and UA, medication uses (Keflex), f/u plan with her PCP, and return precautions. Pt understands and agrees. All questions and concerns addressed. Pt is stable to D/C home at this time.     Provider Notes:   35 year old woman with one month of recurring epigastric and right upper quadrant abdominal pain.  She reports it is worse over the past few days, saw primary care, and was told to come to the ED, due to elevated triglycerides, elevated alkaline phosphatase, and given the right upper quadrant epigastric pain, concern for possible cholecystitis versus pancreatitis. No cp, sob, hypoxia, or other known factors or symptoms to suggest cardiac/pe as cause. She does not have any abdominal pain below her umbilicus, denies any vaginal discharge, is currently on her period - slight vaginal bleeding, no dysuria, hematuria, true diarrhea (soft nnon watery stool).  No fever or any other associated symptoms.    Laboratory testing shows a possible urinary tract infection, without any other obvious abnormality.  Given the right upper quadrant pain, possible pyelonephritis?  Overall appears well. Will treat with keflex, followup with primary care, discussed return precautions.    Diagnosis     Clinical Impression:   1. Epigastric pain    2. Non-intractable vomiting with nausea, unspecified vomiting type    3. Acute UTI        Treatment Plan:   ED Disposition     Discharge Alinda Money Ambros discharge to home/self care.    Condition at disposition: Stable              _______________________________      Attestations: This note is prepared by [name] Abe, acting as scribe for Geannie Risen, MD. The scribe's documentation has been prepared under my direction and personally reviewed by me in its entirety.  I confirm that the note above  accurately reflects all work, treatment, procedures, and medical decision making performed by me.    _______________________________          Thea Gist, MD  01/20/16 [phone]

## 2016-01-19 NOTE — Discharge Instructions (Signed)
Dolor abdominal epigstrico (causa no especificada)     Epigastric Abdominal Pain (Cause Unspecified)     1.  Usted fue tratado por dolor abdominal (de estmago) epigstrico. An no sabemos la causa de su dolor.   1.  You were treated for epigastric abdominal (belly) pain. We don t know the cause of the pain yet.             2.  El dolor epigstrico est ubicado en el centro de la parte superior derecha de su estmago, bajo sus costillas.    2.  Epigastric pain is located in the center of your upper belly right under your ribs.              3.  Existen varias causas comunes del dolor abdominal epigstrico. Estas pueden incluir:   3.  There are several common causes of epigastric abdominal pain. These may include:      * Enfermedad de reflujo gastroesofgico (ERGE) o ardor: esta es la causa ms comn. Generalmente ocurre inmediatamente despus de comer.     * Gastroesophageal reflux disease (GERD) or heartburn: This is the most common cause. It usually happens right after eating.       * Gastritis: esta es inflamacin del estmago.    * Gastritis: This is inflammation of your stomach.      * Intolerancia a la lactosa: esto significa que el cuerpo no puede digerir la lactosa. Esta se encuentra en la leche y otros productos lcteos.    * Lactose Intolerance: This means the body can t digest lactose. This is found in milk and other dairy products.      * Pancreatitis: esto es cuando su pncreas se inflama. A menudo, el dolor se puede sentir en la espalda.    * Pancreatitis: This is when your pancreas gets inflamed. Often the pain can be felt in the back.      * lceras en su estmago o intestino.    * Ulcers in your stomach or intestine.             4.  Su mdico ha diagnosticado su condicin en base a su examen fsico y su historial. Tambin se le pudo haber realizado una prueba de imgenes o un anlisis de sangre para asegurarse de que no tenga ningn  problema serio.    4.  Your doctor diagnosed your condition by your physical exam and your history. You may have also had imaging or blood work done to make sure you don't have any serious problems.              5.  El tratamiento del dolor abdominal epigstrico se enfoca en disminuir los sntomas. Se le pueden administrar lquidos y electrolitos (sodio y potasio) por va intravenosa. Posiblemente se le den analgsicos.    5.  Treatment of epigastric abdominal pain focuses on making symptoms better. Fluids and electrolytes (sodium and potassium) may be given through an IV. Pain medications may be given.              6.  Su mdico le puede dar algo para disminuir la produccin de cido o para cubrir el recubrimiento estomacal.   6.  Your doctor may give you something to lower acid production or coat the stomach lining.             7.  Medicamentos reductores de cido - estos medicamentos disminuyen la cantidad de cido que produce su estmago. Esto ayuda a que   sane la inflamacin del revestimiento estomacal. Los horarios y las dosis pueden variar, as que siga las indicaciones con mucho cuidado. Dos de los tipos comunes de medicamentos anticidos son los bloqueadores H2 e inhibidores de la bomba de protones (IBP). Estos incluyen:    - H2 famotidina (Pepcid)  - H2 ranitidina (Zantac)  - IBP omeprazol (Prilosec)  - IBP lansoprazol (Prevacid)  - IBP esomeprazol (Nexium)  - IBP pantoprazol (Protonix)   7.  Acid Reducing Medications - These medications lower the amount of acid made in your stomach. This helps the inflammation in the stomach lining heal. Scheduling and dosing can vary, so follow the instructions carefully. Two of the common types of antacid medications are H2 blockers and proton pump inhibitors (PPIs). They include:    - H2 famotidine (Pepcid)  - H2 ranitidine (Zantac)  - PPI omeprazole (Prilosec)  - PPI lansoprazole (Prevacid)  - PPI esomeprazole (Nexium)  -  PPI pantoprazole (Protonix)             8.  Medicamentos protectores para el cido - estos se pegan al tejido daado por la lcera y lo protegen del cido y las enzimas. De esta manera, la lcera puede sanar.     - Sucralfato (Carafate).   8.  Acid protective medications - These stick to damaged ulcer tissue and protect against acid and enzymes. This way, the ulcer can heal.     - Sucralafate (Carafate).             9.  Usted ha sido evaluado, tratado y observado por su mdico. Su mdico cree que su condicin se ha estabilizado y es seguro que se vaya a su casa.   9.  You have been evaluated, treated, and observed by your doctor.Your doctor feels thatyour condition has stabilized and it is safe for you to go home.             10.  Aunque creemos que su condicin no es peligrosa por el momento, es importante que tenga cuidado. A veces, un problema que parece leve puede convertirse en algo serio despus. Es por eso que es muy importante que regrese aqu o que acuda a la sala de emergencias ms cercana si no mejora o si sus sntomas empeoran.   10.  Though we don't believe your condition is dangerous right now, it is important to be careful. Sometimes a problem that seems mild can become serious later. This is why it is very important that you return here or go to the nearest Emergency Department if you are not improving or your symptoms are getting worse.             11.  Su mdico le puede pedir que le d seguimiento con su mdico de atencin primaria de manera ambulatoria para revisar su condicin. Dele seguimiento como se le indic.   11.  Your doctor may have you follow up with your primary care doctor as an outpatient to check your condition. Follow up as directed.             12.  Su mdico puede recetarle analgsicos para tratar su dolor. Puede usar medicamentos de venta libre como el acetaminofeno (Tylenol). Es importante seguir las instrucciones para  tomar estos medicamentos.   12.  Your doctor may prescribe you pain medications to treat yourpain. You can use over-the-counter medicines like acetaminophen (Tylenol). It is important to follow the directions for taking these medications.               13.  Ablandadores de materia fecal: estos medicamentos le ayudan para el estreimiento causado por medicamentos narcticos. Puede usar laxantes de venta libre como Daisetta de magnesia o citrato de [place].   13.  Stool softeners - These medications help with the constipation caused by narcotic medications. You can use over-the-counter laxatives like milk of magnesia or magnesium citrate.             14.  DEBE BUSCAR ATENCIN MDICA INMEDIATAMENTE, AQU O EN LA SALA DE EMERGENCIAS MS CERCANA, SI SE PRESENTA CUALQUIERA DE LAS SIGUIENTES SITUACIONES:   14.  YOU SHOULD SEEK MEDICAL ATTENTION IMMEDIATELY, EITHER HERE OR AT THE NEAREST EMERGENCY DEPARTMENT, IF ANY OF THE FOLLOWING OCCUR:      * Si tiene un repentino dolor en el estmago o pecho.    * You have sudden severe pain in your belly or chest.      * Su dolor empeora o no desaparece.    * Your pain gets worse or does not go away.      * Si vomita sangre o hay sangre en sus heces. Su sangre puede estar de color rojo brillante o negra y pegajosa.    * You throw up blood or see blood in your stool. Blood may be bright red or dark black and tarry.      * Si su piel o sus ojos se ven amarillos o su orina tiene un color caf oscuro.    * Your skin or eyes look yellow or your urine (pee) looks dark brown.      * Si tiene fiebre (temperatura mayor de 100.56F o 38C) o escalofros.     * You get a fever (temperature higher than 100.56F or 38C) or shaking chills.              15.  Si no puede dar seguimiento con su mdico, o si en cualquier momento cree que necesita una nueva revisin o ser atendido de [place], venga aqu o acuda a la sala de emergencias ms cercana.   15.   If you can t follow up with your doctor, or if at any time you feel you need to be rechecked or seen again, come back here or go to the nearest emergency department.                           Vmitos     Vomiting     1.  Usted ha sido atendido por vmitos.   1.  You have been seen for vomiting.             2.  Los vmitos pueden ser ocasionados por muchas [employer]. En la [employer], la causa NO es seria. El mdico piensa que se puede ir a Estate agent.    2.  Vomiting (throwing-up) can be caused by many different things. Most of the time the cause IS NOT serious. The doctor feels it is OK for you to go home today.             3.  Algunas de las causas ms comunes del vmito son:   3.  Common causes of vomiting include the following:      * Gastroenteritis (gripe del estmago), generalmente con diarrea.     * Gastroenteritis (stomach flu), usually with diarrhea.      * Otras enfermedades. En ocasiones, otras condiciones mdicas como la diabetes, problemas del corazn,  dolores de [place] o infecciones pueden ocasionar vmito.     * Other illnesses. Sometimes medical conditions like diabetes, heart problems, headaches, or infections can make someone throw up.       * Obstrucciones (tapones) en el intestino pueden causar vmitos junto con la incapacidad de defecar (soltar heces) o de soltar gases.    * Bowel obstructions (blockages) can cause vomiting and make patients unable to have bowel movements (stool) or pass gas.      * Los vmitos pueden tambin ser un sntoma de apendicitis, especialmente si hay tambin dolor en la parte inferior derecha del abdomen (estmago).    * Vomiting can be a symptom of appendicitis, especially if there is also pain in the right lower abdomen (belly).             4.  En ocasiones es difcil averiguar qu es lo que causa el vmito. El vmito puede tratarse con medicamentos antinuseas como prometazina (Phenergan),  proclorperazina (Compazine) u ondansetrn (Zofran).   4.  Sometimes it is hard to find out what is causing the vomiting. Vomiting can be treated with anti-nausea medicines like promethazine (Phenergan), prochlorperazine (Compazine) or ondansetron (Zofran).             5.  Trate de tomar lquidos para evitar la deshidratacin. No beba mucho lquido a la vez. Tome sorbos pequeos a lo Paediatric nurse.    5.  Try to drink liquids to avoid dehydration. Don't drink a lot of fluid all at once. Take small sips throughout the day.             6.  DEBE BUSCAR ATENCIN MDICA INMEDIATAMENTE, AQU O EN LA SALA DE EMERGENCIAS MS CERCANA, SI SE PRESENTA CUALQUIERA DE LAS SIGUIENTES SITUACIONES:   6.  YOU SHOULD SEEK MEDICAL ATTENTION IMMEDIATELY, EITHER HERE OR AT THE NEAREST EMERGENCY DEPARTMENT, IF ANY OF THE FOLLOWING OCCURS:      * No puede dejar de vomitar o sus vmitos no disminuyen con el medicamento.     * You can't stop vomiting or your vomiting doesn't get better with medication.      * No puede retener lquidos.    * You cannot keep liquids down.      * Tiene un dolor de Hotel manager fuerte y repentino o dolor en el estmago despus de vomitar.    * You have severe sudden chest or belly pain after vomiting.      * Tiene dolor abdominal.    * You have abdominal pain.                           Infeccin del tracto urinario     Urinary Tract Infection     1.  Se le ha diagnosticado una infeccin del tracto urinario inferior. Tambin se conoce como cistitis.    1.  You have been diagnosed with a lower urinary tract infection (UTI). This is also called cystitis.             2.  La cistitis es una infeccin en la vejiga. Su mdico la diagnostic al hacer exmenes de su orina. Normalmente, la cistitis causa ardor al Beatrix Shipper o una necesidad frecuente de Geographical information systems officer. Puede sentir ganas de Geographical information systems officer sin necesitar de hacerlo.    2.  Cystitis is an infection in your bladder. Your  doctor diagnosed it by testing your urine. Cystitis usually causes burning with urination or frequent urination. It might  make you feel like you have to urinate even when you don't.              3.  La cistitis generalmente se trata con antibiticos y analgsicos.   3.  Cystitis is usually treated with antibiotics and medicine to help with pain.             4.  Es MUY IMPORTANTE que surta su receta y tome todos los antibiticos como se indique. Si una infeccin del tracto urinario no se trata [employer], puede convertirse en una infeccin de rin.   4.  It is VERY IMPORTANT that you fill your prescription and take all of the antibiotics as directed. If a lower urinary tract infection goes untreated for too long, it can become a kidney infection.             5.  PARA LAS MUJERES: para reducir el riesgo de volver a contraer cistitis:    5.  FOR WOMEN: To reduce the risk of getting cystitis again:      * Siempre orine antes y despus de un encuentro sexual.    * Always urinate before and after sexual intercourse.      * Siempre lmpiese de delante hacia atrs despus de Automotive engineer. No se limpie de atrs hacia delante.     * Always wipe from front to back after urinating or having a bowel movement. Do not wipe from back to front.      * Beba bastantes lquidos. Trate de beber jugo de arndanos rojos o azules. Estos jugos tienen una sustancia qumica que evita que la bacteria se "pegue" a la vejiga.    * Drink plenty of fluids. Try to drink cranberry or blueberry juice. These juices have a chemical that stops bacteria from "sticking" to the bladder.             6.  DEBE BUSCAR ATENCIN MDICA INMEDIATA, AQU O EN LA SALA DE EMERGENCIAS MS CERCANA, SI SE PRESENTA CUALQUIERA DE LAS SIGUIENTES SITUACIONES:   6.  YOU SHOULD SEEK MEDICAL ATTENTION IMMEDIATELY, EITHER HERE OR AT THE NEAREST EMERGENCY DEPARTMENT, IF ANY OF THE FOLLOWING OCCURS:      * Tiene  fiebre (temperatura mayor de 100.40F / 38C) o escalofros.    * You have a fever (temperature higher than 100.40F / 38C) or shaking chills.      * Siente nuseas o vomita.    * You feel nauseated or vomit.      * Tiene dolor en un costado o en la espalda.    * You have pain in your side or back.      * No mejora despus de tomar todos sus antibiticos.    * You don't get better after taking all of your antibiotics.      * Tiene nuevos sntomas o molestias.     * You have any new symptoms or concerns.      * Se siente peor o no mejora.     * You feel worse or do not improve.

## 2016-01-19 NOTE — ED Notes (Signed)
Pt. Is 35 yr old female who presents by herself to the ED. Pt. Reports abdominal pain that radiates to her back which her classify as 8/10 on pain scale. Pt. Was referred by her pcp to come to the ED because of her high cholestrol and trigleceride levels. Pt. Reports vomiting and diarrhea that started on Saturday 01/17/2016. Pt. Reports having diarrhea yesterday night 01/18/2016. Pt. Report nausea. Pt. A&O x4  BP 142/91 mmHg  Pulse 117  Temp(Src) 98.2 F (36.8 C) (Oral)  Resp 20  Wt 56.79 kg  SpO2 100%

## 2016-01-19 NOTE — ED Notes (Signed)
Pt is tough IV stick. EMT Reggie attempting IV access for blood work at this time.

## 2016-01-19 NOTE — ED Notes (Signed)
Awaiting u/s at this time.

## 2016-01-22 NOTE — Progress Notes (Signed)
Quick Note:    On keflex. No further action needed  ______

## 2016-06-17 ENCOUNTER — Encounter: Payer: Self-pay | Admitting: Emergency Medicine

## 2016-06-17 DIAGNOSIS — E1165 Type 2 diabetes mellitus with hyperglycemia: Secondary | ICD-10-CM | POA: Insufficient documentation

## 2016-06-17 DIAGNOSIS — I10 Essential (primary) hypertension: Secondary | ICD-10-CM | POA: Insufficient documentation

## 2016-06-17 DIAGNOSIS — N390 Urinary tract infection, site not specified: Secondary | ICD-10-CM | POA: Insufficient documentation

## 2016-06-17 LAB — URINALYSIS COMPLETE WITH MICROSCOPIC (ARMC ONLY)
BILIRUBIN URINE: NEGATIVE
Hgb urine dipstick: NEGATIVE
KETONES UR: NEGATIVE mg/dL
Nitrite: NEGATIVE
Protein, ur: NEGATIVE mg/dL
Specific Gravity, Urine: 1.014 (ref 1.005–1.030)
pH: 6 (ref 5.0–8.0)

## 2016-06-17 LAB — COMPREHENSIVE METABOLIC PANEL
ALK PHOS: 117 U/L (ref 38–126)
ALT: 20 U/L (ref 14–54)
AST: 21 U/L (ref 15–41)
Albumin: 3.9 g/dL (ref 3.5–5.0)
Anion gap: 7 (ref 5–15)
BUN: 9 mg/dL (ref 6–20)
CALCIUM: 9.1 mg/dL (ref 8.9–10.3)
CHLORIDE: 102 mmol/L (ref 101–111)
CO2: 27 mmol/L (ref 22–32)
CREATININE: 0.48 mg/dL (ref 0.44–1.00)
Glucose, Bld: 352 mg/dL — ABNORMAL HIGH (ref 65–99)
Potassium: 3.5 mmol/L (ref 3.5–5.1)
Sodium: 136 mmol/L (ref 135–145)
Total Bilirubin: 0.3 mg/dL (ref 0.3–1.2)
Total Protein: 7.8 g/dL (ref 6.5–8.1)

## 2016-06-17 LAB — CBC
HCT: 39.2 % (ref 35.0–47.0)
Hemoglobin: 13.2 g/dL (ref 12.0–16.0)
MCH: 27.6 pg (ref 26.0–34.0)
MCHC: 33.5 g/dL (ref 32.0–36.0)
MCV: 82.4 fL (ref 80.0–100.0)
PLATELETS: 328 10*3/uL (ref 150–440)
RBC: 4.76 MIL/uL (ref 3.80–5.20)
RDW: 13.4 % (ref 11.5–14.5)
WBC: 10 10*3/uL (ref 3.6–11.0)

## 2016-06-17 LAB — POCT PREGNANCY, URINE: Preg Test, Ur: NEGATIVE

## 2016-06-17 LAB — LIPASE, BLOOD: Lipase: 31 U/L (ref 11–51)

## 2016-06-17 NOTE — ED Triage Notes (Signed)
Patient reports lower back and abd pain times three days. Patient reports that the pain became worse today. Patient states that she vomited 3 days ago. Denies any urinary symptoms.

## 2016-06-18 ENCOUNTER — Emergency Department: Payer: Self-pay

## 2016-06-18 ENCOUNTER — Encounter: Payer: Self-pay | Admitting: Radiology

## 2016-06-18 ENCOUNTER — Emergency Department
Admission: EM | Admit: 2016-06-18 | Discharge: 2016-06-18 | Disposition: A | Discharge status: Home / Self Care | Payer: Self-pay | Attending: Emergency Medicine | Admitting: Emergency Medicine

## 2016-06-18 DIAGNOSIS — N390 Urinary tract infection, site not specified: Secondary | ICD-10-CM

## 2016-06-18 DIAGNOSIS — R739 Hyperglycemia, unspecified: Secondary | ICD-10-CM

## 2016-06-18 HISTORY — DX: Type 2 diabetes mellitus without complications: E11.9

## 2016-06-18 HISTORY — DX: Depression, unspecified: F32.A

## 2016-06-18 HISTORY — DX: Pure hypercholesterolemia, unspecified: E78.00

## 2016-06-18 HISTORY — DX: Essential (primary) hypertension: I10

## 2016-06-18 HISTORY — DX: Major depressive disorder, single episode, unspecified: F32.9

## 2016-06-18 LAB — GLUCOSE, CAPILLARY: GLUCOSE-CAPILLARY: 231 mg/dL — AB (ref 65–99)

## 2016-06-18 MED ORDER — SODIUM CHLORIDE 0.9 % IV BOLUS (SEPSIS)
1000.0000 mL | Freq: Once | INTRAVENOUS | Status: AC
Start: 2016-06-18 — End: 2016-06-18
  Administered 2016-06-18: 1000 mL via INTRAVENOUS

## 2016-06-18 MED ORDER — ONDANSETRON HCL 4 MG/2ML IJ SOLN
4.0000 mg | Freq: Once | INTRAMUSCULAR | Status: AC
  Administered 2016-06-18: 4 mg via INTRAVENOUS
  Filled 2016-06-18: qty 2

## 2016-06-18 MED ORDER — IOPAMIDOL (ISOVUE-300) INJECTION 61%
100.0000 mL | Freq: Once | INTRAVENOUS | Status: AC | PRN
  Administered 2016-06-18: 100 mL via INTRAVENOUS

## 2016-06-18 MED ORDER — SODIUM CHLORIDE 0.9 % IV BOLUS (SEPSIS)
1000.0000 mL | Freq: Once | INTRAVENOUS | Status: AC
  Administered 2016-06-18: 1000 mL via INTRAVENOUS

## 2016-06-18 MED ORDER — IOPAMIDOL (ISOVUE-300) INJECTION 61%
30.0000 mL | Freq: Once | INTRAVENOUS | Status: AC | PRN
  Administered 2016-06-18: 30 mL via ORAL

## 2016-06-18 MED ORDER — CIPROFLOXACIN HCL 500 MG PO TABS: 500.0000 mg | ORAL_TABLET | Freq: Two times a day (BID) | ORAL | 0 refills | Status: AC

## 2016-06-18 MED ORDER — MORPHINE SULFATE (PF) 2 MG/ML IV SOLN
2.0000 mg | Freq: Once | INTRAVENOUS | Status: AC
  Administered 2016-06-18: 2 mg via INTRAVENOUS
  Filled 2016-06-18: qty 1

## 2016-06-18 NOTE — ED Provider Notes (Signed)
[facility] Emergency Department Provider Note    I have reviewed the triage vital signs and the nursing notes.   HISTORY  Chief Complaint Back Pain and Abdominal Pain    HPI Rachel Booth is a 35 y.o. female with h/o DM, hypertension and Hyperlipidemia presents with three-day history of right flank/right lower quadrant abdominal pain associated with vomiting. Patient also admits to dysuria and urinary frequency and urgency. Patient admits to elevated glucose at home she admits that she has ran out of her metformin. She denies any fever afebrile on presentation   Past Medical History:  Diagnosis Date  . Depression   . Diabetes mellitus without complication (HCC)   . Hypercholesteremia   . Hypertension     There are no active problems to display for this patient.   Past Surgical History:  Procedure Laterality Date  . CESAREAN SECTION     times 2    Prior to Admission medications   Medication Sig Start Date End Date Taking? Authorizing Provider  ciprofloxacin (CIPRO) 500 MG tablet Take 1 tablet (500 mg total) by mouth 2 (two) times daily. 06/18/16 06/23/16  Darci Current, MD    Allergies Review of patient's allergies indicates no known allergies.  No family history on file.  Social History Social History  Substance Use Topics  . Smoking status: Never Smoker  . Smokeless tobacco: Never Used  . Alcohol use No    Review of Systems Constitutional: No fever/chills Eyes: No visual changes. ENT: No sore throat. Cardiovascular: Denies chest pain. Respiratory: Denies shortness of breath. Gastrointestinal: Positive for abdominal pain and vomiting Genitourinary: Negative for dysuria. Musculoskeletal: Negative for back pain. Skin: Negative for rash. Neurological: Negative for headaches, focal weakness or numbness.  10-point ROS otherwise negative.  ____________________________________________   PHYSICAL EXAM:  VITAL SIGNS: ED  Triage Vitals  Enc Vitals Group     BP 06/17/16 2224 137/77     Pulse Rate 06/17/16 2224 (!) 112     Resp 06/17/16 2224 18     Temp 06/17/16 2224 98.7 F (37.1 C)     Temp Source 06/17/16 2224 Oral     SpO2 06/17/16 2224 97 %     Weight 06/17/16 2225 124 lb (56.2 kg)     Height --      Head Circumference --      Peak Flow --      Pain Score 06/17/16 2225 10     Pain Loc --      Pain Edu? --      Excl. in GC? --     Constitutional: Alert and oriented. Well appearing and in no acute distress. Eyes: Conjunctivae are normal. PERRL. EOMI. Head: Atraumatic. Ears:  Healthy appearing ear canals and TMs bilaterally Nose: No congestion/rhinnorhea. Mouth/Throat: Mucous membranes are moist.  Oropharynx non-erythematous. Neck: No stridor.  No meningeal signs.   Cardiovascular: Normal rate, regular rhythm. Good peripheral circulation. Grossly normal heart sounds. Respiratory: Normal respiratory effort.  No retractions. Lungs CTAB. Gastrointestinal: Right lower quadrant tenderness to palpation.. No distention.   Musculoskeletal: No lower extremity tenderness nor edema. No gross deformities of extremities. Neurologic:  Normal speech and language. No gross focal neurologic deficits are appreciated.  Skin:  Skin is warm, dry and intact. No rash noted. Psychiatric: Mood and affect are normal. Speech and behavior are normal.  ____________________________________________   LABS (all labs ordered are listed, but only abnormal results are displayed)  Labs Reviewed  COMPREHENSIVE METABOLIC PANEL -  Abnormal; Notable for the following:       Result Value   Glucose, Bld 352 (*)    All other components within normal limits  URINALYSIS COMPLETEWITH MICROSCOPIC ([facility] ONLY) - Abnormal; Notable for the following:    Color, Urine STRAW (*)    APPearance CLEAR (*)    Glucose, UA >500 (*)    Leukocytes, UA 1+ (*)    Bacteria, UA RARE (*)    Squamous Epithelial / LPF 0-5 (*)    All other components  within normal limits  GLUCOSE, CAPILLARY - Abnormal; Notable for the following:    Glucose-Capillary 231 (*)    All other components within normal limits  LIPASE, BLOOD  CBC  POC URINE PREG, ED  POCT PREGNANCY, URINE    RADIOLOGY I, Cordova N BROWN, personally viewed and evaluated these images (plain radiographs) as part of my medical decision making, as well as reviewing the written report by the radiologist.  Ct Abdomen Pelvis W Contrast  Result Date: 06/18/2016 CLINICAL DATA:  Lower back and abdominal pain.  Vomiting. EXAM: CT ABDOMEN AND PELVIS WITH CONTRAST TECHNIQUE: Multidetector CT imaging of the abdomen and pelvis was performed using the standard protocol following bolus administration of intravenous contrast. CONTRAST:  100mL ISOVUE-300 IOPAMIDOL (ISOVUE-300) INJECTION 61% COMPARISON:  None. FINDINGS: Lower chest: No pulmonary nodules. No visible pleural or pericardial effusion. Hepatobiliary: Normal hepatic size and contours without focal liver lesion. No perihepatic ascites. No intra- or extrahepatic biliary dilatation. Normal gallbladder. Pancreas: Normal pancreatic contours and enhancement. No peripancreatic fluid collection or pancreatic ductal dilatation. Spleen: Normal. Adrenals/Urinary Tract: Normal adrenal glands. No hydronephrosis or solid renal mass. Stomach/Bowel: No abnormal bowel dilatation. No bowel wall thickening or adjacent fat stranding to indicate acute inflammation. No abdominal fluid collection. Normal appendix. Vascular/Lymphatic: Normal course and caliber of the major abdominal vessels. No abdominal or pelvic adenopathy. Reproductive: Small amount of fluid within the endometrial cavity, likely normal for age. The ovaries are normal. Mildly peripherally enhancing left ovarian cyst, likely corpus luteum. Musculoskeletal: No lytic or blastic osseous lesion. Normal visualized extrathoracic and extraperitoneal soft tissues. Other: No contributory non-categorized findings.  IMPRESSION: No acute abdominal or pelvic abnormality. Electronically Signed   By: Deatra RobinsonKevin  Herman M.D.   On: 06/18/2016 05:21     Procedures   Critical Care performed:  ____________________________________________   INITIAL IMPRESSION / ASSESSMENT AND PLAN / ED COURSE  Pertinent labs & imaging results that were available during my care of the patient were reviewed by me and considered in my medical decision making (see chart for details).  Patient given 2 L IV normal saline with improvement in blood glucose to 231. CT scan of the abdomen and pelvis revealed no gross abnormality.   Clinical Course    ____________________________________________  FINAL CLINICAL IMPRESSION(S) / ED DIAGNOSES  Final diagnoses:  UTI (lower urinary tract infection)  Hyperglycemia     MEDICATIONS GIVEN DURING THIS VISIT:  Medications  sodium chloride 0.9 % bolus 1,000 mL (0 mLs Intravenous Stopped 06/18/16 0431)  morphine 2 MG/ML injection 2 mg (2 mg Intravenous Given 06/18/16 0316)  ondansetron (ZOFRAN) injection 4 mg (4 mg Intravenous Given 06/18/16 0315)  iopamidol (ISOVUE-300) 61 % injection 30 mL (30 mLs Oral Contrast Given 06/18/16 0321)  sodium chloride 0.9 % bolus 1,000 mL (0 mLs Intravenous Stopped 06/18/16 0738)  iopamidol (ISOVUE-300) 61 % injection 100 mL (100 mLs Intravenous Contrast Given 06/18/16 0458)     NEW OUTPATIENT MEDICATIONS STARTED DURING THIS VISIT:  Discharge  Medication List as of 06/18/2016  6:41 AM    START taking these medications   Details  ciprofloxacin (CIPRO) 500 MG tablet Take 1 tablet (500 mg total) by mouth 2 (two) times daily., Starting Fri 06/18/2016, Until Wed 06/23/2016, Print        Discharge Medication List as of 06/18/2016  6:41 AM      Discharge Medication List as of 06/18/2016  6:41 AM       Note:  This document was prepared using Dragon voice recognition software and may include unintentional dictation errors.    Darci Current, MD 06/19/16 [phone]

## 2016-06-30 ENCOUNTER — Emergency Department
Admission: EM | Admit: 2016-06-30 | Discharge: 2016-06-30 | Disposition: A | Discharge status: Home / Self Care | Payer: No Typology Code available for payment source | Attending: Emergency Medicine | Admitting: Emergency Medicine

## 2016-06-30 ENCOUNTER — Emergency Department: Payer: No Typology Code available for payment source

## 2016-06-30 DIAGNOSIS — Y999 Unspecified external cause status: Secondary | ICD-10-CM | POA: Insufficient documentation

## 2016-06-30 DIAGNOSIS — S39012A Strain of muscle, fascia and tendon of lower back, initial encounter: Secondary | ICD-10-CM | POA: Insufficient documentation

## 2016-06-30 DIAGNOSIS — M7918 Myalgia, other site: Secondary | ICD-10-CM

## 2016-06-30 DIAGNOSIS — Y92410 Unspecified street and highway as the place of occurrence of the external cause: Secondary | ICD-10-CM | POA: Insufficient documentation

## 2016-06-30 DIAGNOSIS — E119 Type 2 diabetes mellitus without complications: Secondary | ICD-10-CM | POA: Insufficient documentation

## 2016-06-30 DIAGNOSIS — Y9389 Activity, other specified: Secondary | ICD-10-CM | POA: Insufficient documentation

## 2016-06-30 DIAGNOSIS — I10 Essential (primary) hypertension: Secondary | ICD-10-CM | POA: Insufficient documentation

## 2016-06-30 LAB — POCT PREGNANCY, URINE: Preg Test, Ur: NEGATIVE

## 2016-06-30 MED ORDER — CYCLOBENZAPRINE HCL 10 MG PO TABS: 10.0000 mg | ORAL_TABLET | Freq: Three times a day (TID) | ORAL | 0 refills | Status: AC | PRN

## 2016-06-30 MED ORDER — CYCLOBENZAPRINE HCL 10 MG PO TABS
10.0000 mg | ORAL_TABLET | Freq: Once | ORAL | Status: AC
  Administered 2016-06-30: 10 mg via ORAL
  Filled 2016-06-30: qty 1

## 2016-06-30 MED ORDER — TRAMADOL HCL 50 MG PO TABS: 50.0000 mg | ORAL_TABLET | Freq: Four times a day (QID) | ORAL | 0 refills | Status: AC | PRN

## 2016-06-30 MED ORDER — IBUPROFEN 600 MG PO TABS
600.0000 mg | ORAL_TABLET | Freq: Once | ORAL | Status: AC
Start: 2016-06-30 — End: 2016-06-30
  Administered 2016-06-30: 600 mg via ORAL
  Filled 2016-06-30: qty 1

## 2016-06-30 MED ORDER — TRAMADOL HCL 50 MG PO TABS
50.0000 mg | ORAL_TABLET | Freq: Once | ORAL | Status: AC
  Administered 2016-06-30: 50 mg via ORAL
  Filled 2016-06-30: qty 1

## 2016-06-30 MED ORDER — IBUPROFEN 600 MG PO TABS: 600.0000 mg | ORAL_TABLET | Freq: Three times a day (TID) | ORAL | 0 refills | Status: DC | PRN
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
## 2016-10-11 ENCOUNTER — Encounter: Payer: Self-pay | Admitting: Emergency Medicine

## 2016-10-11 DIAGNOSIS — Z7984 Long term (current) use of oral hypoglycemic drugs: Secondary | ICD-10-CM | POA: Insufficient documentation

## 2016-10-11 DIAGNOSIS — W268XXA Contact with other sharp object(s), not elsewhere classified, initial encounter: Secondary | ICD-10-CM | POA: Insufficient documentation

## 2016-10-11 DIAGNOSIS — Y939 Activity, unspecified: Secondary | ICD-10-CM | POA: Insufficient documentation

## 2016-10-11 DIAGNOSIS — L089 Local infection of the skin and subcutaneous tissue, unspecified: Secondary | ICD-10-CM | POA: Insufficient documentation

## 2016-10-11 DIAGNOSIS — I10 Essential (primary) hypertension: Secondary | ICD-10-CM | POA: Insufficient documentation

## 2016-10-11 DIAGNOSIS — S60410A Abrasion of right index finger, initial encounter: Secondary | ICD-10-CM | POA: Insufficient documentation

## 2016-10-11 DIAGNOSIS — E119 Type 2 diabetes mellitus without complications: Secondary | ICD-10-CM | POA: Insufficient documentation

## 2016-10-11 DIAGNOSIS — Y929 Unspecified place or not applicable: Secondary | ICD-10-CM | POA: Insufficient documentation

## 2016-10-11 DIAGNOSIS — Y990 Civilian activity done for income or pay: Secondary | ICD-10-CM | POA: Insufficient documentation

## 2016-10-11 NOTE — ED Triage Notes (Signed)
Pt presents to ED with painful index finger on right hand. Pt states on Saturday she cut her finger on a piece of plastic while opening a box at work. Abrasion noted. Slight redness and swelling present. No bleeding or drainage.

## 2016-10-12 ENCOUNTER — Emergency Department
Admission: EM | Admit: 2016-10-12 | Discharge: 2016-10-12 | Disposition: A | Discharge status: Home / Self Care | Payer: Self-pay | Attending: Emergency Medicine | Admitting: Emergency Medicine

## 2016-10-12 DIAGNOSIS — S6991XA Unspecified injury of right wrist, hand and finger(s), initial encounter: Secondary | ICD-10-CM

## 2016-10-12 DIAGNOSIS — L089 Local infection of the skin and subcutaneous tissue, unspecified: Secondary | ICD-10-CM

## 2016-10-12 DIAGNOSIS — T148XXA Other injury of unspecified body region, initial encounter: Secondary | ICD-10-CM

## 2016-10-12 LAB — GLUCOSE, CAPILLARY: Glucose-Capillary: 428 mg/dL — ABNORMAL HIGH (ref 65–99)

## 2016-10-12 MED ORDER — HYDROCODONE-ACETAMINOPHEN 5-325 MG PO TABS
1.0000 | ORAL_TABLET | Freq: Once | ORAL | Status: AC
  Administered 2016-10-12: 1 via ORAL
  Filled 2016-10-12: qty 1

## 2016-10-12 MED ORDER — HYDROCODONE-ACETAMINOPHEN 5-325 MG PO TABS: 1.0000 | ORAL_TABLET | Freq: Four times a day (QID) | ORAL | 0 refills | Status: AC | PRN

## 2016-10-12 MED ORDER — CEPHALEXIN 500 MG PO CAPS
500.0000 mg | ORAL_CAPSULE | Freq: Once | ORAL | Status: AC
  Administered 2016-10-12: 500 mg via ORAL
  Filled 2016-10-12: qty 1

## 2016-10-12 MED ORDER — CEPHALEXIN 500 MG PO CAPS: 500.0000 mg | ORAL_CAPSULE | Freq: Three times a day (TID) | ORAL | 0 refills | Status: AC

## 2016-10-12 MED ORDER — ONDANSETRON 4 MG PO TBDP
4.0000 mg | ORAL_TABLET | Freq: Once | ORAL | Status: AC
  Administered 2016-10-12: 4 mg via ORAL
  Filled 2016-10-12: qty 1

## 2016-10-12 MED ORDER — TETANUS-DIPHTH-ACELL PERTUSSIS 5-2.5-18.5 LF-MCG/0.5 IM SUSP
0.5000 mL | Freq: Once | INTRAMUSCULAR | Status: AC
  Administered 2016-10-12: 0.5 mL via INTRAMUSCULAR
  Filled 2016-10-12: qty 0.5

## 2016-10-12 MED ORDER — METFORMIN HCL ER (OSM) 1000 MG PO TB24: 1000.0000 mg | ORAL_TABLET | Freq: Two times a day (BID) | ORAL | 0 refills | Status: DC

## 2016-10-12 MED ORDER — METFORMIN HCL 500 MG PO TABS
1000.0000 mg | ORAL_TABLET | Freq: Once | ORAL | Status: AC
  Administered 2016-10-12: 1000 mg via ORAL
  Filled 2016-10-12: qty 2

## 2016-10-12 MED ORDER — IBUPROFEN 600 MG PO TABS
600.0000 mg | ORAL_TABLET | Freq: Once | ORAL | Status: AC
  Administered 2016-10-12: 600 mg via ORAL
  Filled 2016-10-12: qty 1

## 2016-10-12 MED ORDER — IBUPROFEN 600 MG PO TABS: 600.0000 mg | ORAL_TABLET | Freq: Three times a day (TID) | ORAL | 0 refills | Status: AC | PRN

## 2016-10-12 NOTE — ED Notes (Signed)

## 2016-10-12 NOTE — ED Provider Notes (Signed)
[facility] Emergency Department Provider Note   ____________________________________________   First MD Initiated Contact with Patient 10/12/16 0021     (approximate)  I have reviewed the triage vital signs and the nursing notes.   HISTORY  Chief Complaint Finger Injury    HPI Rachel Booth is a 36 y.o. female who presents to the ED from home with a chief complaint of right finger pain. Patient reports she cut her finger on a piece of plastic 3 nights ago while at work. Complains of pain, redness and swelling to her right index finger. Patient is right-hand dominant. He also admits she has been out of her metformin and insulin for the past 2 months. Complains of generalized malaise. Denies fever, chills, chest pain, shortness of breath, abdominal pain, nausea, vomiting, diarrhea. Tetanus is not up-to-date. Nothing makes her symptoms better or worse.   Past Medical History:  Diagnosis Date  . Depression   . Diabetes mellitus without complication (HCC)   . Hypercholesteremia   . Hypertension     There are no active problems to display for this patient.   Past Surgical History:  Procedure Laterality Date  . CESAREAN SECTION     times 2    Prior to Admission medications   Medication Sig Start Date End Date Taking? Authorizing Provider  cephALEXin (KEFLEX) 500 MG capsule Take 1 capsule (500 mg total) by mouth 3 (three) times daily. 10/12/16   Irean Hong, MD  cyclobenzaprine (FLEXERIL) 10 MG tablet Take 1 tablet (10 mg total) by mouth 3 (three) times daily as needed. 06/30/16   Joni Reining, PA-C  HYDROcodone-acetaminophen (NORCO) 5-325 MG tablet Take 1 tablet by mouth every 6 (six) hours as needed for moderate pain. 10/12/16   Irean Hong, MD  ibuprofen (ADVIL,MOTRIN) 600 MG tablet Take 1 tablet (600 mg total) by mouth every 8 (eight) hours as needed. 10/12/16   Irean Hong, MD  metformin (FORTAMET) 1000 MG (OSM) 24 hr tablet Take 1 tablet (1,000 mg  total) by mouth 2 (two) times daily with a meal. 10/12/16   Irean Hong, MD  traMADol (ULTRAM) 50 MG tablet Take 1 tablet (50 mg total) by mouth every 6 (six) hours as needed. 06/30/16 06/30/17  Joni Reining, PA-C    Allergies Patient has no known allergies.  No family history on file.  Social History Social History  Substance Use Topics  . Smoking status: Never Smoker  . Smokeless tobacco: Never Used  . Alcohol use No    Review of Systems  Constitutional: No fever/chills. Eyes: No visual changes. ENT: No sore throat. Cardiovascular: Denies chest pain. Respiratory: Denies shortness of breath. Gastrointestinal: No abdominal pain.  No nausea, no vomiting.  No diarrhea.  No constipation. Genitourinary: Negative for dysuria. Musculoskeletal: Positive for right finger pain Negative for back pain. Skin: Negative for rash. Neurological: Negative for headaches, focal weakness or numbness.  10-point ROS otherwise negative.  ____________________________________________   PHYSICAL EXAM:  VITAL SIGNS: ED Triage Vitals  Enc Vitals Group     BP 10/11/16 2224 121/79     Pulse Rate 10/11/16 2224 (!) 103     Resp 10/11/16 2224 18     Temp 10/11/16 2224 98.3 F (36.8 C)     Temp Source 10/11/16 2224 Oral     SpO2 10/11/16 2224 100 %     Weight 10/11/16 2224 122 lb (55.3 kg)     Height 10/11/16 2224 5' (1.524 m)  Head Circumference --      Peak Flow --      Pain Score 10/11/16 2225 10     Pain Loc --      Pain Edu? --      Excl. in GC? --     Constitutional: Alert and oriented. Well appearing and in no acute distress. Eyes: Conjunctivae are normal. PERRL. EOMI. Head: Atraumatic. Nose: No congestion/rhinnorhea. Mouth/Throat: Mucous membranes are moist.  Oropharynx non-erythematous. Neck: No stridor.   Cardiovascular: Normal rate, regular rhythm. Grossly normal heart sounds.  Good peripheral circulation. Respiratory: Normal respiratory effort.  No retractions. Lungs  CTAB. Gastrointestinal: Soft and nontender. No distention. No abdominal bruits. No CVA tenderness. Musculoskeletal:  Right index finger: Abrasion noted to medial aspect with overlying scab. There is surrounding mild swelling and redness. Finger is held in slight flexion, but there is no fusiform swelling, no tenderness along the flexor tendon sheath, no pain with passive finger extension. 2+ radial pulses. Brisk, less than 5 second capillary refill. Neurologic:  Normal speech and language. No gross focal neurologic deficits are appreciated. No gait instability. Skin:  Skin is warm, dry and intact. No rash noted. Psychiatric: Mood and affect are normal. Speech and behavior are normal.  ____________________________________________   LABS (all labs ordered are listed, but only abnormal results are displayed)  Labs Reviewed  GLUCOSE, CAPILLARY - Abnormal; Notable for the following:       Result Value   Glucose-Capillary 428 (*)    All other components within normal limits   ____________________________________________  EKG  None ____________________________________________  RADIOLOGY  None ____________________________________________   PROCEDURES  Procedure(s) performed: None  Procedures  Critical Care performed: No  ____________________________________________   INITIAL IMPRESSION / ASSESSMENT AND PLAN / ED COURSE  Pertinent labs & imaging results that were available during my care of the patient were reviewed by me and considered in my medical decision making (see chart for details).  36 year old female who presents with infected right finger abrasion. She has been out of diabetes medications for the past 2 months; hyperglycemic on Accu-Chek. There is no acute flexor tenosynovitis. Will place empirically on Keflex, update tetanus, refill metformin and analgesics as needed. Patient will follow-up with hand surgery this week. Strict return precautions given. Patient  verbalizes understanding and agrees with plan of care.  Clinical Course      ____________________________________________   FINAL CLINICAL IMPRESSION(S) / ED DIAGNOSES  Final diagnoses:  Injury of finger of right hand, initial encounter  Wound infection      NEW MEDICATIONS STARTED DURING THIS VISIT:  New Prescriptions   CEPHALEXIN (KEFLEX) 500 MG CAPSULE    Take 1 capsule (500 mg total) by mouth 3 (three) times daily.   HYDROCODONE-ACETAMINOPHEN (NORCO) 5-325 MG TABLET    Take 1 tablet by mouth every 6 (six) hours as needed for moderate pain.   IBUPROFEN (ADVIL,MOTRIN) 600 MG TABLET    Take 1 tablet (600 mg total) by mouth every 8 (eight) hours as needed.   METFORMIN (FORTAMET) 1000 MG (OSM) 24 HR TABLET    Take 1 tablet (1,000 mg total) by mouth 2 (two) times daily with a meal.     Note:  This document was prepared using Dragon voice recognition software and may include unintentional dictation errors.    Irean HongJade J Mauro Arps, MD 10/12/16 [phone]

## 2016-10-12 NOTE — ED Notes (Signed)
Pt presents to ED with c/o injury to right index finger, pt states cut her finger at work on Saturday but unsure if she cut it on a plastic bottle or against metal shelf. Pt also reports has not been able to take medication for her diabetes in 2 months, states takes Metformin 1000 mg BID and insulin. Redness and swelling noted to right index finger, no drainage or bleeding noted. Pt alert and oriented x 4, respirations even and unlabored. Dr. Dolores FrameSung at bedside.

## 2016-10-12 NOTE — Discharge Instructions (Signed)
1. Your tetanus has been updated; it will be good for 10 years. 2. Take antibiotic as prescribed (Keflex 500 mg 3 times daily 7 days). 3. Keep wound clean and dry. 4. You may take pain medicines as needed (Motrin/Norco #15). 5. Your metformin has been refilled: Metformin 1000mg  twice daily. 6. Return to the ER for worsening symptoms, persistent vomiting, fever, increased redness/swelling or other concerns.

## 2016-10-12 NOTE — ED Notes (Signed)
Wound cleaned with normal saline and gauze, dressing applied to finger. Pt tolerated procedure well.

## 2016-12-09 ENCOUNTER — Encounter: Payer: Self-pay | Admitting: Emergency Medicine

## 2016-12-09 ENCOUNTER — Emergency Department
Admission: EM | Admit: 2016-12-09 | Discharge: 2016-12-10 | Disposition: A | Discharge status: Home / Self Care | Payer: Self-pay | Attending: Emergency Medicine | Admitting: Emergency Medicine

## 2016-12-09 ENCOUNTER — Other Ambulatory Visit: Payer: Self-pay

## 2016-12-09 ENCOUNTER — Emergency Department: Payer: Self-pay

## 2016-12-09 DIAGNOSIS — Z79899 Other long term (current) drug therapy: Secondary | ICD-10-CM | POA: Insufficient documentation

## 2016-12-09 DIAGNOSIS — E1165 Type 2 diabetes mellitus with hyperglycemia: Secondary | ICD-10-CM | POA: Insufficient documentation

## 2016-12-09 DIAGNOSIS — I10 Essential (primary) hypertension: Secondary | ICD-10-CM | POA: Insufficient documentation

## 2016-12-09 DIAGNOSIS — R739 Hyperglycemia, unspecified: Secondary | ICD-10-CM

## 2016-12-09 DIAGNOSIS — F321 Major depressive disorder, single episode, moderate: Secondary | ICD-10-CM | POA: Insufficient documentation

## 2016-12-09 LAB — CBC
HEMATOCRIT: 39.1 % (ref 35.0–47.0)
HEMOGLOBIN: 13.3 g/dL (ref 12.0–16.0)
MCH: 28.3 pg (ref 26.0–34.0)
MCHC: 34.1 g/dL (ref 32.0–36.0)
MCV: 82.9 fL (ref 80.0–100.0)
Platelets: 408 10*3/uL (ref 150–440)
RBC: 4.71 MIL/uL (ref 3.80–5.20)
RDW: 14.3 % (ref 11.5–14.5)
WBC: 9.8 10*3/uL (ref 3.6–11.0)

## 2016-12-09 LAB — URINALYSIS, COMPLETE (UACMP) WITH MICROSCOPIC
BACTERIA UA: NONE SEEN
Bilirubin Urine: NEGATIVE
Glucose, UA: >=500 mg/dL — AB
KETONES UR: NEGATIVE mg/dL
Leukocytes, UA: NEGATIVE
Nitrite: NEGATIVE
PH: 8 (ref 5.0–8.0)
Protein, ur: NEGATIVE mg/dL
SPECIFIC GRAVITY, URINE: 1.025 (ref 1.005–1.030)

## 2016-12-09 LAB — BASIC METABOLIC PANEL
ANION GAP: 9 (ref 5–15)
BUN: 16 mg/dL (ref 6–20)
CHLORIDE: 96 mmol/L — AB (ref 101–111)
CO2: 27 mmol/L (ref 22–32)
CREATININE: 0.53 mg/dL (ref 0.44–1.00)
Calcium: 8.7 mg/dL — ABNORMAL LOW (ref 8.9–10.3)
GFR calc non Af Amer: >60 mL/min (ref >60)
Glucose, Bld: 419 mg/dL — ABNORMAL HIGH (ref 65–99)
POTASSIUM: 3.5 mmol/L (ref 3.5–5.1)
SODIUM: 132 mmol/L — AB (ref 135–145)

## 2016-12-09 LAB — GLUCOSE, CAPILLARY: Glucose-Capillary: 397 mg/dL — ABNORMAL HIGH (ref 65–99)

## 2016-12-09 LAB — TROPONIN I

## 2016-12-09 LAB — POCT PREGNANCY, URINE: Preg Test, Ur: NEGATIVE

## 2016-12-09 MED ORDER — SODIUM CHLORIDE 0.9 % IV BOLUS (SEPSIS)
1000.0000 mL | Freq: Once | INTRAVENOUS | Status: AC
  Administered 2016-12-09: 1000 mL via INTRAVENOUS

## 2016-12-09 NOTE — ED Triage Notes (Addendum)
Patient reports that she ran out of he metformin about 2 weeks ago. Patient states that the last time she checked her blood sugar was about a week ago and it was above 500. Patient states that she also had intermittent chest pain with deep breaths and body aches times 2 weeks. Patient states that she does not want to keep leaving, denies plan for SI.

## 2016-12-09 NOTE — ED Provider Notes (Signed)
Rachel Booth Provider Note    ____________________________________________   I have reviewed the triage vital signs and the nursing notes.   HISTORY  Chief Complaint Hyperglycemia; Generalized Body Aches; and Chest Pain   History limited by: [facility]   HPI Rachel Booth is a 36 y.o. female who presents to the emergency Booth today because of concerns for generalized pain. Patient states that she's been having pain for the past 2 weeks. She describes it is throughout her body. It is worse over the left side of the body. Patient states that she has also having headache with this. In addition the patient states that she has been out of her metformin for the past month. Furthermore the patient does have depression. She denies any active thoughts of wanting to harm herself but states she has thought it would be better if she was no longer alive. Has never seen a therapist or psychiatrist for her depression.   Past Medical History:  Diagnosis Date  . Depression   . Diabetes mellitus without complication (HCC)   . Hypercholesteremia   . Hypertension     There are no active problems to display for this patient.   Past Surgical History:  Procedure Laterality Date  . CESAREAN SECTION     times 2    Prior to Admission medications   Medication Sig Start Date End Date Taking? Authorizing Provider  cephALEXin (KEFLEX) 500 MG capsule Take 1 capsule (500 mg total) by mouth 3 (three) times daily. 10/12/16   Irean HongJade J Sung, MD  cyclobenzaprine (FLEXERIL) 10 MG tablet Take 1 tablet (10 mg total) by mouth 3 (three) times daily as needed. 06/30/16   Joni Reiningonald K Smith, PA-C  HYDROcodone-acetaminophen (NORCO) 5-325 MG tablet Take 1 tablet by mouth every 6 (six) hours as needed for moderate pain. 10/12/16   Irean HongJade J Sung, MD  ibuprofen (ADVIL,MOTRIN) 600 MG tablet Take 1 tablet (600 mg total) by mouth every 8 (eight)  hours as needed. 10/12/16   Irean HongJade J Sung, MD  metformin (FORTAMET) 1000 MG (OSM) 24 hr tablet Take 1 tablet (1,000 mg total) by mouth 2 (two) times daily with a meal. 10/12/16   Irean HongJade J Sung, MD  traMADol (ULTRAM) 50 MG tablet Take 1 tablet (50 mg total) by mouth every 6 (six) hours as needed. 06/30/16 06/30/17  Joni Reiningonald K Smith, PA-C    Allergies Patient has no known allergies.  No family history on file.  Social History Social History  Substance Use Topics  . Smoking status: Never Smoker  . Smokeless tobacco: Never Used  . Alcohol use No    Review of Systems  Constitutional: Negative for fever. Cardiovascular: Positive for chest pain. Respiratory: Negative for shortness of breath. Gastrointestinal: Negative for abdominal pain, vomiting and diarrhea. Genitourinary: Negative for dysuria. Musculoskeletal: Positive for back pain. Skin: Negative for rash. Neurological: Positive for headache Psychiatric: Positive for depression. 10-point ROS otherwise negative.  ____________________________________________   PHYSICAL EXAM:  VITAL SIGNS: ED Triage Vitals [12/09/16 2203]  Enc Vitals Group     BP (!) 142/100     Pulse Rate (!) 107     Resp 18     Temp 97.7 F (36.5 C)     Temp Source Oral     SpO2 100 %     Weight 121 lb (54.9 kg)     Height      Head Circumference      Peak Flow  Pain Score 10     Pain Loc    Constitutional: Alert and oriented. Somewhat tearful. Eyes: Conjunctivae are normal. Normal extraocular movements. ENT   Head: Normocephalic and atraumatic.   Nose: No congestion/rhinnorhea.   Mouth/Throat: Mucous membranes are moist.   Neck: No stridor. Hematological/Lymphatic/Immunilogical: No cervical lymphadenopathy. Cardiovascular: Normal rate, regular rhythm.  No murmurs, rubs, or gallops.  Respiratory: Normal respiratory effort without tachypnea nor retractions. Breath sounds are clear and equal bilaterally. No  wheezes/rales/rhonchi. Gastrointestinal: Soft and non tender. No rebound. No guarding.  Genitourinary: Deferred Musculoskeletal: Normal range of motion in all extremities. No lower extremity edema. Neurologic:  Normal speech and language. No gross focal neurologic deficits are appreciated.  Skin:  Skin is warm, dry and intact. No rash noted. Psychiatric: Somewhat tearful, depressed. ____________________________________________    LABS (pertinent positives/negatives)  Labs Reviewed  GLUCOSE, CAPILLARY - Abnormal; Notable for the following:       Result Value   Glucose-Capillary 397 (*)    All other components within normal limits  BLOOD GAS, VENOUS - Abnormal; Notable for the following:    Bicarbonate 31.5 (*)    Acid-Base Excess 5.2 (*)    All other components within normal limits  URINALYSIS, COMPLETE (UACMP) WITH MICROSCOPIC - Abnormal; Notable for the following:    Color, Urine STRAW (*)    APPearance CLEAR (*)    Glucose, UA >=500 (*)    Hgb urine dipstick SMALL (*)    Squamous Epithelial / LPF 0-5 (*)    All other components within normal limits  BASIC METABOLIC PANEL - Abnormal; Notable for the following:    Sodium 132 (*)    Chloride 96 (*)    Glucose, Bld 419 (*)    Calcium 8.7 (*)    All other components within normal limits  URINE CULTURE  CBC  TROPONIN I  POCT PREGNANCY, URINE     ____________________________________________   EKG  I, Phineas Semen, attending physician, personally viewed and interpreted this EKG  EKG Time: 2205 Rate: 104 Rhythm: sinus tachycardia Axis: left axis deviation Intervals: qtc 486 QRS: narrow ST changes: no st elevation Impression: abnormal ekg   ____________________________________________    RADIOLOGY  CXR IMPRESSION:  No acute abnormality.   ____________________________________________   PROCEDURES  Procedures  ____________________________________________   INITIAL IMPRESSION / ASSESSMENT AND PLAN  / ED COURSE  Pertinent labs & imaging results that were available during my care of the patient were reviewed by me and considered in my medical decision making (see chart for details).  Patient presented to the emergency [facility] today with full body pain, headache, concerns for high blood sugar. On exam patient did appear somewhat depressed and he had a discussion. Patient has had depression for a while and has had somewhat passive thoughts of suicidal ideation. Given this will have patient be seen by psychiatry. Blood work while showing elevated glucose does not show any signs of DKA. Patient will be given IV fluids. Patient's urine had 6-30 white blood cells however patient without any dysuria type symptoms. No leukocytosis. Will send for culture.   ____________________________________________   FINAL CLINICAL IMPRESSION(S) / ED DIAGNOSES  Hyperglycemia Depression  Note: This dictation was prepared with Dragon dictation. Any transcriptional errors that result from this process are unintentional     Phineas Semen, MD 12/10/16 1407

## 2016-12-09 NOTE — ED Notes (Addendum)
Assessment done through interpreter on stick, MD at bedside. Pt states out of her metformin x 40month, c/o CP and body aches with chills. Pt also states she is having some depression, denies any  plan but has " thoughts of not wanting to be alive".

## 2016-12-10 LAB — HEPATIC FUNCTION PANEL
ALT: 28 U/L (ref 14–54)
AST: 31 U/L (ref 15–41)
Albumin: 3.8 g/dL (ref 3.5–5.0)
Alkaline Phosphatase: 130 U/L — ABNORMAL HIGH (ref 38–126)
Bilirubin, Direct: <0.1 mg/dL — ABNORMAL LOW (ref 0.1–0.5)
TOTAL PROTEIN: 7 g/dL (ref 6.5–8.1)
Total Bilirubin: 0.7 mg/dL (ref 0.3–1.2)

## 2016-12-10 LAB — GLUCOSE, CAPILLARY: GLUCOSE-CAPILLARY: 383 mg/dL — AB (ref 65–99)

## 2016-12-10 MED ORDER — METFORMIN HCL ER (OSM) 1000 MG PO TB24: 1000.0000 mg | ORAL_TABLET | Freq: Two times a day (BID) | ORAL | 2 refills | Status: AC

## 2016-12-10 MED ORDER — DULOXETINE HCL 30 MG PO CPEP: 30.0000 mg | ORAL_CAPSULE | Freq: Every day | ORAL | 0 refills | Status: AC

## 2016-12-10 NOTE — ED Provider Notes (Signed)
-----------------------------------------   6:55 AM on 12/10/2016 -----------------------------------------  [facility] Hermelinda MedicusAlvarado who deemed patient psychiatrically stable for discharge. Recommendations for Cymbalta 30 mg daily to help with pain and depression. Will discharge home with prescription for Cymbalta and close follow-up with [facility]. Strict return precautions given. Patient verbalizes understanding and agrees with plan of care.    Irean HongJade J Sung, MD 12/10/16 [phone]

## 2016-12-10 NOTE — ED Provider Notes (Addendum)
I was called and at discharge as the patient had questions. Her initial reason for coming to the ED was to get a refill on prescription for metformin.  I did review her laboratory studies, and I don't see any liver function tests since September, I am going to recheck this before prescribing her metformin.  She tells me that prior to her one-month ED prescription in January from Dr. Jerl Santos, she had been previously on metformin 1000 mg twice a day prescribed by her doctor in [place], but she does not live there anymore.  She also states that she tried 3 clinics which were provided to her previously, she thinks they work at a clinic, [facility], and [facility] and states that she was told they were all full and that she should just try calling every 2 weeks to find out if there owing to be any openings.  Although I typically would just provide one-month supply through the emergency department, given this case, she is trying everything that she can to arrange follow-up, I will go ahead and provide 2 refills on the metformin so that she can be treated for her diabetes while she is making every effort to get in with a primary care doctor.   ___________  Addended, alkaline phosphatase just barely above the normal limit at 1:30, other LFTs within normal limits. Normal renal function. I discussed with hospitalist regarding minimally elevated alk phos and choice of hypoglycemic agent, will go ahead with metformin, she knows that she is to follow-up with primary care doctor.   Lisa Roca, MD 12/10/16 [place], MD 12/10/16 [phone]

## 2016-12-10 NOTE — Discharge Instructions (Signed)
1. Start Cymbalta 30 mg daily for depression. 2. Take diabetes medicines as directed by your doctor. 3. Return to the ER for worsening symptoms, persistent vomiting, difficulty breathing or other concerns.

## 2016-12-10 NOTE — ED Notes (Signed)
SOC contacted this RN to get history and ED report.  SOC machine placed at bedside and [facility]

## 2016-12-10 NOTE — ED Notes (Signed)
Interpreter requested for pt discharge.

## 2016-12-10 NOTE — ED Notes (Signed)
Upon discharge, was made aware that pt is out of Metformin; last prescription provided by ED per pt report, pt without insurance or primary care physician.  Resources provided and MD notified; see new orders.

## 2016-12-12 LAB — URINE CULTURE

## 2016-12-13 LAB — BLOOD GAS, VENOUS
ACID-BASE EXCESS: 5.2 mmol/L — AB (ref 0.0–2.0)
Bicarbonate: 31.5 mmol/L — ABNORMAL HIGH (ref 20.0–28.0)
PATIENT TEMPERATURE: 37
pCO2, Ven: 52 mmHg (ref 44.0–60.0)
pH, Ven: 7.39 (ref 7.250–7.430)

## 2016-12-13 NOTE — Progress Notes (Signed)
ED Culture Results   Allergies: NKA Visit Date:3/2 Chief Complaint:Hyperglycemia and full body pain  Culture Type: Urine  Culture Results: Ecoli  Original Abx given: None  Original Abx sensitive, intermediate, or resistant:N/A Recommended Abx: Recommended not treating the urine culture to ED MD; however MD Cyril LoosenKinner would like to treat with Keflex  ED Physician: Lovenia ShuckKinner Contacted Patient: Yes; contacted patient along with intepreter; however patient did not answer and does not have a voicemail set up @ this time. Pharmacy will try again tomorrow.  Prescription Called into:   Demetrius Charityeldrin D. Onie Hayashi, PharmD, BCPS Clinical Pharmacist

## 2016-12-15 NOTE — Progress Notes (Signed)
ED Culture Results   Allergies: NKA Visit Date:3/2 Chief Complaint:Hyperglycemia and full body pain  Culture Type: Urine  Culture Results: Ecoli  Original Abx given: None  Original Abx sensitive, intermediate, or resistant:N/A Recommended Abx: Recommended not treating the urine culture to ED MD; however MD Cyril LoosenKinner would like to treat with Keflex 500 mg bid x 7d ED Physician: Lovenia ShuckKinner Contacted Patient: Yes; contacted patient along with intepreter; however patient did not answer and does not have a voicemail set up @ this time. Pharmacy will try again tomorrow.  Prescription Called into:   3/718: Discussed with previous Pharmacist.  3rd attempt to contact patient. Phone with no voicemail, unable to leave message.  Bari MantisKristin Raeana Blinn PharmD Clinical Pharmacist 12/15/2016

## 2017-01-10 ENCOUNTER — Emergency Department: Payer: Self-pay

## 2017-01-10 ENCOUNTER — Encounter: Payer: Self-pay | Admitting: *Deleted

## 2017-01-10 ENCOUNTER — Emergency Department
Admission: EM | Admit: 2017-01-10 | Discharge: 2017-01-10 | Disposition: A | Discharge status: Home / Self Care | Payer: Self-pay | Attending: Emergency Medicine | Admitting: Emergency Medicine

## 2017-01-10 DIAGNOSIS — I10 Essential (primary) hypertension: Secondary | ICD-10-CM | POA: Insufficient documentation

## 2017-01-10 DIAGNOSIS — R140 Abdominal distension (gaseous): Secondary | ICD-10-CM | POA: Insufficient documentation

## 2017-01-10 DIAGNOSIS — Z79899 Other long term (current) drug therapy: Secondary | ICD-10-CM | POA: Insufficient documentation

## 2017-01-10 DIAGNOSIS — E119 Type 2 diabetes mellitus without complications: Secondary | ICD-10-CM | POA: Insufficient documentation

## 2017-01-10 DIAGNOSIS — R1084 Generalized abdominal pain: Secondary | ICD-10-CM | POA: Insufficient documentation

## 2017-01-10 LAB — URINALYSIS, COMPLETE (UACMP) WITH MICROSCOPIC
BILIRUBIN URINE: NEGATIVE
Bacteria, UA: NONE SEEN
Glucose, UA: >=500 mg/dL — AB
HGB URINE DIPSTICK: NEGATIVE
KETONES UR: NEGATIVE mg/dL
LEUKOCYTES UA: NEGATIVE
NITRITE: NEGATIVE
PROTEIN: NEGATIVE mg/dL
Specific Gravity, Urine: 1.023 (ref 1.005–1.030)
pH: 5 (ref 5.0–8.0)

## 2017-01-10 LAB — CBC
HEMATOCRIT: 35.8 % (ref 35.0–47.0)
HEMOGLOBIN: 12.1 g/dL (ref 12.0–16.0)
MCH: 27.5 pg (ref 26.0–34.0)
MCHC: 33.9 g/dL (ref 32.0–36.0)
MCV: 81.1 fL (ref 80.0–100.0)
Platelets: 372 10*3/uL (ref 150–440)
RBC: 4.41 MIL/uL (ref 3.80–5.20)
RDW: 13.8 % (ref 11.5–14.5)
WBC: 9.8 10*3/uL (ref 3.6–11.0)

## 2017-01-10 LAB — COMPREHENSIVE METABOLIC PANEL
ALBUMIN: 3.8 g/dL (ref 3.5–5.0)
ALT: 25 U/L (ref 14–54)
ANION GAP: 8 (ref 5–15)
AST: 28 U/L (ref 15–41)
Alkaline Phosphatase: 162 U/L — ABNORMAL HIGH (ref 38–126)
BILIRUBIN TOTAL: 0.5 mg/dL (ref 0.3–1.2)
BUN: 10 mg/dL (ref 6–20)
CHLORIDE: 100 mmol/L — AB (ref 101–111)
CO2: 21 mmol/L — ABNORMAL LOW (ref 22–32)
Calcium: 8.8 mg/dL — ABNORMAL LOW (ref 8.9–10.3)
Creatinine, Ser: 0.55 mg/dL (ref 0.44–1.00)
GFR calc Af Amer: >60 mL/min (ref >60)
GLUCOSE: 451 mg/dL — AB (ref 65–99)
POTASSIUM: 4.1 mmol/L (ref 3.5–5.1)
Sodium: 129 mmol/L — ABNORMAL LOW (ref 135–145)
TOTAL PROTEIN: 7.2 g/dL (ref 6.5–8.1)

## 2017-01-10 LAB — PREGNANCY, URINE: PREG TEST UR: NEGATIVE

## 2017-01-10 LAB — LIPASE, BLOOD: LIPASE: 31 U/L (ref 11–51)

## 2017-01-10 LAB — GLUCOSE, CAPILLARY: Glucose-Capillary: 317 mg/dL — ABNORMAL HIGH (ref 65–99)

## 2017-01-10 MED ORDER — SODIUM CHLORIDE 0.9 % IV BOLUS (SEPSIS)
1000.0000 mL | Freq: Once | INTRAVENOUS | Status: AC
  Administered 2017-01-10: 1000 mL via INTRAVENOUS

## 2017-01-10 MED ORDER — DOCUSATE SODIUM 100 MG PO CAPS: 100.0000 mg | ORAL_CAPSULE | Freq: Every day | ORAL | 0 refills | Status: AC

## 2017-01-10 MED ORDER — INSULIN ASPART 100 UNIT/ML ~~LOC~~ SOLN
5.0000 [IU] | Freq: Once | SUBCUTANEOUS | Status: AC
  Administered 2017-01-10: 5 [IU] via SUBCUTANEOUS

## 2017-01-10 MED ORDER — INSULIN ASPART 100 UNIT/ML ~~LOC~~ SOLN
SUBCUTANEOUS | Status: AC
  Administered 2017-01-10: 5 [IU] via SUBCUTANEOUS
  Filled 2017-01-10: qty 5

## 2017-01-10 NOTE — ED Provider Notes (Signed)
[facility] Emergency Department Provider Note   ____________________________________________    I have reviewed the triage vital signs and the nursing notes.   HISTORY  Chief Complaint Abdominal Pain   Spanish interpreter used  HPI Rachel Booth is a 36 y.o. female who presents with complaints of abdominal bloating. She reports for several weeks she has had bloating and discomfort typically after eating. She denies nausea or vomiting. She is also had frequent episodes of diarrhea. She denies fevers or chills. No recent travel. She does have a history of diabetes and high blood pressure. No sick contacts reported.   Past Medical History:  Diagnosis Date  . Depression   . Diabetes mellitus without complication ([place])   . Hypercholesteremia   . Hypertension     There are no active problems to display for this patient.   Past Surgical History:  Procedure Laterality Date  . CESAREAN SECTION     times 2    Prior to Admission medications   Medication Sig Start Date End Date Taking? Authorizing Provider  cephALEXin (KEFLEX) 500 MG capsule Take 1 capsule (500 mg total) by mouth 3 (three) times daily. Patient not taking: Reported on 12/10/2016 10/12/16   Paulette Blanch, MD  cyclobenzaprine (FLEXERIL) 10 MG tablet Take 1 tablet (10 mg total) by mouth 3 (three) times daily as needed. Patient not taking: Reported on 12/10/2016 06/30/16   Sable Feil, PA-C  docusate sodium (COLACE) 100 MG capsule Take 1 capsule (100 mg total) by mouth daily. 01/10/17 01/20/17  Lavonia Drafts, MD  DULoxetine (CYMBALTA) 30 MG capsule Take 1 capsule (30 mg total) by mouth daily. 12/10/16   Paulette Blanch, MD  HYDROcodone-acetaminophen (NORCO) 5-325 MG tablet Take 1 tablet by mouth every 6 (six) hours as needed for moderate pain. Patient not taking: Reported on 12/10/2016 10/12/16   Paulette Blanch, MD  ibuprofen (ADVIL,MOTRIN) 600 MG tablet Take 1 tablet (600 mg total) by mouth every 8 (eight)  hours as needed. Patient not taking: Reported on 12/10/2016 10/12/16   Paulette Blanch, MD  metformin (FORTAMET) 1000 MG (OSM) 24 hr tablet Take 1 tablet (1,000 mg total) by mouth 2 (two) times daily with a meal. 12/10/16   Lisa Roca, MD  traMADol (ULTRAM) 50 MG tablet Take 1 tablet (50 mg total) by mouth every 6 (six) hours as needed. Patient not taking: Reported on 12/10/2016 06/30/16 06/30/17  Sable Feil, PA-C     Allergies Patient has no known allergies.  No family history on file.  Social History Social History  Substance Use Topics  . Smoking status: Never Smoker  . Smokeless tobacco: Never Used  . Alcohol use No    Review of Systems  Constitutional: No fever/chills  Cardiovascular: Denies chest pain. Respiratory: Denies shortness of breath. Gastrointestinal: As above   Genitourinary: Negative for dysuria. Musculoskeletal: Negative for back pain. Skin: Negative for rash. Neurological: Negative for headaches or weakness  10-point ROS otherwise negative.  ____________________________________________   PHYSICAL EXAM:  VITAL SIGNS: ED Triage Vitals [01/10/17 2118]  Enc Vitals Group     BP (!) 138/98     Pulse Rate (!) 110     Resp 20     Temp 98.4 F (36.9 C)     Temp Source Oral     SpO2 98 %     Weight 121 lb (54.9 kg)     Height      Head Circumference  Peak Flow      Pain Score 10     Pain Loc      Pain Edu?      Excl. in [place]?     Constitutional: Alert and oriented. No acute distress.  Eyes: Conjunctivae are normal.   Nose: No congestion/rhinnorhea. Mouth/Throat: Mucous membranes are moist.    Cardiovascular: Normal rate, regular rhythm. Grossly normal heart sounds.  Good peripheral circulation. Respiratory: Normal respiratory effort.  No retractions. Lungs CTAB. Gastrointestinal: Soft and nontender.Mild distention  No CVA tenderness. Genitourinary: deferred Musculoskeletal: .  Warm and well perfused Neurologic:  Normal speech and language. No  gross focal neurologic deficits are appreciated.  Skin:  Skin is warm, dry and intact. No rash noted. Psychiatric: Mood and affect are normal. Speech and behavior are normal.  ____________________________________________   LABS (all labs ordered are listed, but only abnormal results are displayed)  Labs Reviewed  COMPREHENSIVE METABOLIC PANEL - Abnormal; Notable for the following:       Result Value   Sodium 129 (*)    Chloride 100 (*)    CO2 21 (*)    Glucose, Bld 451 (*)    Calcium 8.8 (*)    Alkaline Phosphatase 162 (*)    All other components within normal limits  URINALYSIS, COMPLETE (UACMP) WITH MICROSCOPIC - Abnormal; Notable for the following:    Color, Urine STRAW (*)    APPearance CLEAR (*)    Glucose, UA >=500 (*)    Squamous Epithelial / LPF 0-5 (*)    All other components within normal limits  GLUCOSE, CAPILLARY - Abnormal; Notable for the following:    Glucose-Capillary 317 (*)    All other components within normal limits  LIPASE, BLOOD  CBC  PREGNANCY, URINE   ____________________________________________  EKG  None ____________________________________________  RADIOLOGY  KUB unremarkable ____________________________________________   PROCEDURES  Procedure(s) performed: No    Critical Care performed: No ____________________________________________   INITIAL IMPRESSION / ASSESSMENT AND PLAN / ED COURSE  Pertinent labs & imaging results that were available during my care of the patient were reviewed by me and considered in my medical decision making (see chart for details).  Patient overall well-appearing and in no acute distress. Heart rate is mildly elevated. Abdominal exam is reassuring. Not consistent with cholecystitis, appendicitis, pancreatitis, SBO. Suspect problem with absorption such as lactose intolerance versus viral gastroenteritis. Treat with IV fluids check labs KUB and reevaluate  Lab works significant for elevated glucose and  mildly elevated alk phos  Gallbladder unremarkable on EMBU. Neg sonographic murphy's  KUB unremarkable. Recommend outpatient follow up.       ____________________________________________   FINAL CLINICAL IMPRESSION(S) / ED DIAGNOSES  Final diagnoses:  Bloating  Generalized abdominal pain      NEW MEDICATIONS STARTED DURING THIS VISIT:  New Prescriptions   DOCUSATE SODIUM (COLACE) 100 MG CAPSULE    Take 1 capsule (100 mg total) by mouth daily.     Note:  This document was prepared using Dragon voice recognition software and may include unintentional dictation errors.    Lavonia Drafts, MD 01/10/17 2325

## 2017-01-10 NOTE — ED Triage Notes (Signed)
Pt complains of abdominal pain/bloating with nausea for 2 weeks

## 2017-01-11 ENCOUNTER — Ambulatory Visit: Payer: Self-pay

## 2017-02-16 ENCOUNTER — Ambulatory Visit: Payer: Self-pay | Admitting: Pharmacy Technician

## 2017-02-16 NOTE — Progress Notes (Signed)
Patient scheduled for eligibility appointment at [facility].  Patient did not show for the appointment on Feb 16, 2017 at 9:00a.m.  Patient did not reschedule eligibility appointment.  [facility] unable to provide additional medication assistance until eligibility is determined.  Sherilyn DacostaBetty J. Kluttz Care Manager [facility]
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
## 2018-02-01 ENCOUNTER — Observation Stay
Admission: EM | Admit: 2018-02-01 | Discharge: 2018-02-03 | Disposition: A | Discharge status: Home / Self Care | Payer: Self-pay | Attending: Internal Medicine | Admitting: Internal Medicine

## 2018-02-01 ENCOUNTER — Emergency Department: Payer: Charity

## 2018-02-01 DIAGNOSIS — E1165 Type 2 diabetes mellitus with hyperglycemia: Secondary | ICD-10-CM | POA: Insufficient documentation

## 2018-02-01 DIAGNOSIS — Z794 Long term (current) use of insulin: Secondary | ICD-10-CM | POA: Insufficient documentation

## 2018-02-01 DIAGNOSIS — N10 Acute tubulo-interstitial nephritis: Secondary | ICD-10-CM | POA: Insufficient documentation

## 2018-02-01 DIAGNOSIS — R531 Weakness: Secondary | ICD-10-CM | POA: Insufficient documentation

## 2018-02-01 DIAGNOSIS — I10 Essential (primary) hypertension: Secondary | ICD-10-CM | POA: Insufficient documentation

## 2018-02-01 DIAGNOSIS — I160 Hypertensive urgency: Principal | ICD-10-CM | POA: Insufficient documentation

## 2018-02-01 DIAGNOSIS — E1140 Type 2 diabetes mellitus with diabetic neuropathy, unspecified: Secondary | ICD-10-CM | POA: Insufficient documentation

## 2018-02-01 DIAGNOSIS — M109 Gout, unspecified: Secondary | ICD-10-CM | POA: Insufficient documentation

## 2018-02-01 DIAGNOSIS — R0602 Shortness of breath: Secondary | ICD-10-CM | POA: Insufficient documentation

## 2018-02-01 DIAGNOSIS — N390 Urinary tract infection, site not specified: Secondary | ICD-10-CM

## 2018-02-01 DIAGNOSIS — Z79899 Other long term (current) drug therapy: Secondary | ICD-10-CM | POA: Insufficient documentation

## 2018-02-01 DIAGNOSIS — R55 Syncope and collapse: Secondary | ICD-10-CM

## 2018-02-01 DIAGNOSIS — R5383 Other fatigue: Secondary | ICD-10-CM

## 2018-02-01 DIAGNOSIS — Z8249 Family history of ischemic heart disease and other diseases of the circulatory system: Secondary | ICD-10-CM | POA: Insufficient documentation

## 2018-02-01 LAB — CBC AND DIFFERENTIAL
Absolute NRBC: 0 10*3/uL (ref 0.00–0.00)
Basophils Absolute Automated: 0.08 10*3/uL (ref 0.00–0.08)
Basophils Automated: 0.9 %
Eosinophils Absolute Automated: 0.38 10*3/uL (ref 0.00–0.44)
Eosinophils Automated: 4.4 %
Hematocrit: 37.4 % (ref 34.7–43.7)
Hgb: 11.8 g/dL (ref 11.4–14.8)
Immature Granulocytes Absolute: 0.01 10*3/uL (ref 0.00–0.07)
Immature Granulocytes: 0.1 %
Lymphocytes Absolute Automated: 3.09 10*3/uL (ref 0.42–3.22)
Lymphocytes Automated: 35.5 %
MCH: 24.2 pg — ABNORMAL LOW (ref 25.1–33.5)
MCHC: 31.6 g/dL (ref 31.5–35.8)
MCV: 76.6 fL — ABNORMAL LOW (ref 78.0–96.0)
MPV: 9.7 fL (ref 8.9–12.5)
Monocytes Absolute Automated: 0.59 10*3/uL (ref 0.21–0.85)
Monocytes: 6.8 %
Neutrophils Absolute: 4.55 10*3/uL (ref 1.10–6.33)
Neutrophils: 52.3 %
Nucleated RBC: 0 /100 WBC (ref 0.0–0.0)
Platelets: 394 10*3/uL — ABNORMAL HIGH (ref 142–346)
RBC: 4.88 10*6/uL (ref 3.90–5.10)
RDW: 15 % (ref 11–15)
WBC: 8.7 10*3/uL (ref 3.10–9.50)

## 2018-02-01 LAB — URINE BHCG POC: Urine bHCG POC: NEGATIVE

## 2018-02-01 LAB — IHS D-DIMER: D-Dimer: 0.32 ug/mL FEU (ref 0.00–0.50)

## 2018-02-01 LAB — GLUCOSE WHOLE BLOOD - POCT: Whole Blood Glucose POCT: 289 mg/dL — ABNORMAL HIGH (ref 70–100)

## 2018-02-01 MED ORDER — SODIUM CHLORIDE 0.9 % IV BOLUS
1000.00 mL | Freq: Once | INTRAVENOUS | Status: AC
Start: 2018-02-01 — End: 2018-02-02
  Administered 2018-02-01: 23:00:00 1000 mL via INTRAVENOUS

## 2018-02-01 NOTE — ED Notes (Signed)
Bed: BLH4  Expected date:   Expected time:   Means of arrival:   Comments:  Medic 410 cough

## 2018-02-01 NOTE — ED Provider Notes (Signed)
EMERGENCY DEPARTMENT HISTORY AND PHYSICAL EXAM     Physician/Midlevel provider first contact with patient: 02/01/18 2130         Date: 02/01/2018  Patient Name: Kelsey Miles    History of Presenting Illness     Chief Complaint   Patient presents with   . Generalized weakness   . Shortness of Breath       History Provided By: Pt- Due to language barrier, the appropriate medical translator or service was utilized to ensure accurate information was obtained and communication was unimpeded throughout the emergency department visit.    Chief Complaint: Fatigue  Duration: Three days ago  Timing: Initially improving, but worsened today  Location: Constitutional  Quality:   Severity: Moderate  Exacerbating Factors: None   Alleviating Factors: Initially improved with insulin  Associated Symptoms: Palpitations, SOB, sharp chest pain, HA  Pertinent Negatives: Hx of blood clots, taking blood thinners, hx of MI     Additional History: Kelsey Miles is a 37 y.o. female with a hx of DM presenting to the ED with fatigue for the past three days with associated palpitations, CP, HA, and SOB. The chest pain is intermittent and is sharp and last for several seconds. Pt thought sxs were related to her DM and says that they were initially improved with insulin, but worsened today. She recently moved to the area and currently does not have a doctor. Her father died of a MI at age 54. Denies a hx of blood clots, taking blood thinners, or a hx of MI.    PCP: Pcp, Noneorunknown, MD    Current Facility-Administered Medications   Medication Dose Route Frequency Provider Last Rate Last Dose   . cefTRIAXone (ROCEPHIN) 1 g in sodium chloride 0.9 % 100 mL IVPB mini-bag plus  1 g Intravenous Once Marko Stai, MD       . labetalol (NORMODYNE,TRANDATE) injection 10 mg  10 mg Intravenous Once Marko Stai, MD         Current Outpatient Prescriptions   Medication Sig Dispense Refill   . insulin NPH (HUMULIN,NOVOLIN) 100 UNIT/ML  injection Inject 15 Unit into the skin 2 (two) times daily before meals, in the morning and evening. 10 mL 0   . metFORMIN (GLUCOPHAGE) 1000 MG tablet Take 1 tablet (1,000 mg total) by mouth 2 (two) times daily with meals. 60 tablet 4   . cephalexin (KEFLEX) 500 MG capsule Take 1 capsule (500 mg total) by mouth 2 (two) times daily for 5 days 10 capsule 0   . insulin lispro protamine-insulin lispro (HUMALOG 75-25) (75-25) 100 UNIT/ML SUSP Take 10 units before breakfast  & 10 units before dinner. 10 mL 1   . Insulin Syringe-Needle U-100 (INSULIN SYRINGE .5CC/31GX5/16") 31G X 5/16" 0.5 ML Misc Insulin needle 100 each 0   . lisinopril (PRINIVIL,ZESTRIL) 5 MG tablet Take 1 tablet (5 mg total) by mouth daily. 30 tablet 4   . metFORMIN (GLUCOPHAGE) 1000 MG tablet Take 1,000 mg by mouth 2 (two) times daily with meals.     . naproxen (NAPROSYN) 500 MG tablet Take 1 tablet (500 mg total) by mouth 2 (two) times daily with meals. 15 tablet 0   . ondansetron (ZOFRAN ODT) 4 MG disintegrating tablet 1-2 every 6 hrs as needed 10 tablet 0   . Vitamin D, Ergocalciferol, (DRISDOL) 50000 UNIT Cap Take 1 capsule (50,000 Units total) by mouth once a week. 7 capsule 0       Past History  Past Medical History:  Past Medical History:   Diagnosis Date   . Diabetes    . Diabetes mellitus associated with hormonal etiology 2007   . Gout    . Hypertension        Past Surgical History:  Past Surgical History:   Procedure Laterality Date   . CESAREAN SECTION  x2   . CESAREAN SECTION         Family History:  Family History   Problem Relation Age of Onset   . Diabetes Father    . Diabetes Brother    . Diabetes Sister        Social History:  Social History   Substance Use Topics   . Smoking status: Never Smoker   . Smokeless tobacco: Never Used   . Alcohol use No       Allergies:  No Known Allergies    Review of Systems     Review of Systems   Constitutional: Positive for fatigue. Negative for fever.   HENT: Negative for sore throat.    Eyes:  Negative for visual disturbance.   Respiratory: Positive for shortness of breath.    Cardiovascular: Positive for chest pain.   Gastrointestinal: Negative for abdominal pain and vomiting.   Genitourinary: Negative for dysuria.   Musculoskeletal: Negative for back pain.   Skin: Negative for rash.   Neurological: Positive for headaches.       Physical Exam   BP (!) 162/93   Pulse 100   Temp 98.9 F (37.2 C)   Resp 18   Wt 57 kg   SpO2 99%   BMI 21.57 kg/m     Physical Exam   Constitutional: She is oriented to person, place, and time. She appears well-developed and well-nourished. No distress.   HENT:   Head: Normocephalic and atraumatic.   Mouth/Throat: Oropharynx is clear and moist.   Eyes: Pupils are equal, round, and reactive to light. EOM are normal.   No conjunctival pallor   Neck: Normal range of motion. Neck supple.   Cardiovascular: Normal rate, regular rhythm and intact distal pulses.    Pulmonary/Chest: Effort normal and breath sounds normal. No respiratory distress. She has no wheezes. She exhibits no tenderness.   Abdominal: Soft. She exhibits no distension. There is no tenderness. There is no rigidity, no rebound, no guarding and no CVA tenderness.   Musculoskeletal: Normal range of motion.   Neurological: She is alert and oriented to person, place, and time.   Neurological: Patient is alert and oriented to person, place, and time. No cranial nerve deficit. Gait normal. GCS score is 15. 5/5 muscle strength in bilateral upper and lower extremities. No pronator drift. Normal finger to nose. No sensory deficits   Skin: Skin is warm and dry.   Psychiatric: She has a normal mood and affect.   Vitals reviewed.      Diagnostic Study Results     Labs -     Results     Procedure Component Value Units Date/Time    UA, Reflex to Microscopic (pts 3 + yrs) [[id number]]  (Abnormal) Collected:  02/01/18 2319    Specimen:  Urine Updated:  02/02/18 0014     Urine Type Clean Catch     Color, UA Yellow     Clarity,  UA Clear     Specific Gravity UA 1.025     Urine pH 6.0     Leukocyte Esterase, UA Trace (A)     Nitrite, UA Positive (  A)     Protein, UR Negative     Glucose, UA 500 (A)     Ketones UA Negative     Urobilinogen, UA Negative mg/dL      Bilirubin, UA Negative     Blood, UA Negative     RBC, UA 6 - 10 (A) /hpf      WBC, UA 11 - 25 (A) /hpf      Squamous Epithelial Cells, Urine 0 - 5 /hpf     Troponin I [[id number]] Collected:  02/01/18 2322    Specimen:  Blood Updated:  02/02/18 0012     Troponin I <0.01 ng/mL     GFR [[id number]] Collected:  02/01/18 2322     Updated:  02/02/18 0005     EGFR >60.0    Comprehensive metabolic panel [[id number]]  (Abnormal) Collected:  02/01/18 2322    Specimen:  Blood Updated:  02/02/18 0005     Glucose 281 (H) mg/dL      BUN 24.4 mg/dL      Creatinine 0.7 mg/dL      Sodium 010 (L) mEq/L      Potassium 4.0 mEq/L      Chloride 103 mEq/L      CO2 20 (L) mEq/L      Calcium 9.2 mg/dL      Protein, Total 7.9 g/dL      Albumin 3.8 g/dL      AST (SGOT) 19 U/L      ALT 17 U/L      Alkaline Phosphatase 156 (H) U/L      Bilirubin, Total 0.3 mg/dL      Globulin 4.1 (H) g/dL      Albumin/Globulin Ratio 0.9     Anion Gap 10.0    Hemolysis index [[id number]] Collected:  02/01/18 2322     Updated:  02/02/18 0005     Hemolysis Index 8    D-Dimer [[id number]] Collected:  02/01/18 2322     Updated:  02/01/18 2358     D-Dimer 0.32 ug/mL FEU     CBC with differential [[id number]]  (Abnormal) Collected:  02/01/18 2322    Specimen:  Blood from Blood Updated:  02/01/18 2348     WBC 8.70 x10 3/uL      Hgb 11.8 g/dL      Hematocrit 29.5 %      Platelets 394 (H) x10 3/uL      RBC 4.88 x10 6/uL      MCV 76.6 (L) fL      MCH 24.2 (L) pg      MCHC 31.6 g/dL      RDW 15 %      MPV 9.7 fL      Neutrophils 52.3 %      Lymphocytes Automated 35.5 %      Monocytes 6.8 %      Eosinophils Automated 4.4 %      Basophils Automated 0.9 %      Immature Granulocyte 0.1 %      Nucleated RBC 0.0 /100 WBC      Neutrophils Absolute 4.55  x10 3/uL      Abs Lymph Automated 3.09 x10 3/uL      Abs Mono Automated 0.59 x10 3/uL      Abs Eos Automated 0.38 x10 3/uL      Absolute Baso Automated 0.08 x10 3/uL      Absolute Immature Granulocyte 0.01 x10 3/uL      Absolute NRBC 0.00 x10 3/uL  Rapid influenza A/B antigens [[id number]] Collected:  02/01/18 2138    Specimen:  Nasopharyngeal from Nasal Aspirate Updated:  02/01/18 2207    Narrative:       ORDER#: [id number]                                    ORDERED BY: Shawnie Pons, MUR  SOURCE: Nasal Aspirate                               COLLECTED:  02/01/18 21:38  ANTIBIOTICS AT COLL.:                                RECEIVED :  02/01/18 21:41  Influenza Rapid Antigen A&B                FINAL       02/01/18 22:07  02/01/18   Negative for Influenza A and B             Reference Range: Negative      Urine BHCG POC [[id number]] Collected:  02/01/18 2145     Updated:  02/01/18 2151     Urine bHCG POC Negative    Glucose Whole Blood - POCT [[id number]]  (Abnormal) Collected:  02/01/18 2136     Updated:  02/01/18 2141     POCT - Glucose Whole blood 289 (H) mg/dL           Radiologic Studies -   Radiology Results (24 Hour)     Procedure Component Value Units Date/Time    XR Chest 2 Views [[id number]] Collected:  02/01/18 2229    Order Status:  Completed Updated:  02/01/18 2234    Narrative:       XR CHEST 2 VIEWS    CLINICAL INDICATION: cough ,cp    COMPARISON: 11/22/2015    FINDINGS:   2 views of the chest were obtained. Calcified nodule again  noted in the left lung base. No airspace infiltrate. The heart and  vascularity are within normal limits. There is no pleural thickening or  effusion. No acute process of the bony thorax seen.      Impression:        No active cardiopulmonary disease.        Heron Nay, MD   02/01/2018 10:30 PM      .      Medical Decision Making   I am the first provider for this patient.    I reviewed the vital signs, available nursing notes, past medical history, past surgical history,  family history and social history.    Vital Signs-Reviewed the patient's vital signs.     Patient Vitals for the past 12 hrs:   BP Temp Pulse Resp   02/02/18 0045 (!) 162/93 98.9 F (37.2 C) 100 18   02/01/18 2124 (!) 169/91 98.2 F (36.8 C) 89 18       Pulse Oximetry Analysis - Normal, 100% on RA    Old Medical Records: Nursing notes.     EKG:  Interpreted by the Emergency Physician.   Time Interpreted: 2137   Rate: 100   Rhythm: Sinus Tachycardia    Interpretation: LAD. QTc 461. No acute ST segment elevation or depression. Does not qualify for STEMI criteria.     Clinical Decision  Support:       ED Course:     10:01 PM - Pt agreeable to ED plan, including having lab work performed.     Provider Notes:   37 year old female history of diabetes on metformin and insulin, presenting with generalized weakness, fatigue and episodes of brief pinpoint chest pain lasting seconds, nonradiating, nonexertional, nonreproducible, with intermittent pleuritic nature.  EKG and troponin without acute signs of ischemia.  Dimer negative, and oxygen saturation within normal limits.  Therefore, do not think pulmonary embolism at this time.  Chest x-ray without signs of pneumonia, pulmonary edema, pleural effusion, pneumothorax.  Urine with signs of infection.  Labs without anion gap acidosis, DKA.  We will treat with antibiotics for urine infection, IV fluids and dosed appropriate insulin with repeat of glucose fingerstick.    Patient also demonstrates to have elevated blood pressure and continued shortness of breath, dizziness and near syncope. Neuro exam normal do not suspect CVA.  Given continued symptoms.  Will treat blood pressure and admit for observation for continued management   On admission: Patient hemodynamically stable, D/W patient ED workup. Also discussed red flags and concerning symptoms for which patient will be monitored and managed further during this hospitalization.  Answered patient's questions.  Patient agreed  and understood plan.       For Hospitalized Patients:    1. Hospitalization Decision Time:  The decision to admit this patient was made by the emergency provider at 00:50 on 02/01/2018     2. Aspirin: Aspirin was not given because the patient did not present with a stroke at the time of their Emergency Department evaluation.    Diagnosis     Clinical Impression:   1. Hypertensive urgency    2. Acute UTI    3. SOB (shortness of breath)    4. Fatigue, unspecified type    5. Type 2 diabetes mellitus with hyperglycemia, with long-term current use of insulin    6. Near syncope        Treatment Plan:   ED Disposition     ED Disposition Condition Date/Time Comment    Observation  Thu Feb 02, 2018 12:53 AM Admitting Physician: Oneida Alar [[id number]]   Diagnosis: Hypertensive urgency [604540]   Estimated Length of Stay: < 2 midnights   Tentative Discharge Plan?: Home or Self Care [1]   Patient Class: Observation [104]          _______________________________    Attestations:   This note is prepared by Marcille Blanco, acting as Scribe for Reginia Forts, MD.    Reginia Forts, MD:  The scribe's documentation has been prepared under my direction and personally reviewed by me in its entirety.  I confirm that the note above accurately reflects all work, treatment, procedures, and medical decision making performed by me.    _______________________________             Marko Stai, MD  02/02/18 [phone]

## 2018-02-02 ENCOUNTER — Encounter (INDEPENDENT_AMBULATORY_CARE_PROVIDER_SITE_OTHER): Payer: Self-pay

## 2018-02-02 DIAGNOSIS — N390 Urinary tract infection, site not specified: Secondary | ICD-10-CM

## 2018-02-02 DIAGNOSIS — I160 Hypertensive urgency: Secondary | ICD-10-CM | POA: Diagnosis present

## 2018-02-02 DIAGNOSIS — R0602 Shortness of breath: Secondary | ICD-10-CM

## 2018-02-02 LAB — CBC
Absolute NRBC: 0 10*3/uL (ref 0.00–0.00)
Hematocrit: 30.7 % — ABNORMAL LOW (ref 34.7–43.7)
Hgb: 9.6 g/dL — ABNORMAL LOW (ref 11.4–14.8)
MCH: 24.1 pg — ABNORMAL LOW (ref 25.1–33.5)
MCHC: 31.3 g/dL — ABNORMAL LOW (ref 31.5–35.8)
MCV: 76.9 fL — ABNORMAL LOW (ref 78.0–96.0)
MPV: 10 fL (ref 8.9–12.5)
Nucleated RBC: 0 /100 WBC (ref 0.0–0.0)
Platelets: 355 10*3/uL — ABNORMAL HIGH (ref 142–346)
RBC: 3.99 10*6/uL (ref 3.90–5.10)
RDW: 14 % (ref 11–15)
WBC: 8.99 10*3/uL (ref 3.10–9.50)

## 2018-02-02 LAB — URINALYSIS, REFLEX TO MICROSCOPIC EXAM IF INDICATED
Bilirubin, UA: NEGATIVE
Blood, UA: NEGATIVE
Glucose, UA: 500 — AB
Ketones UA: NEGATIVE
Nitrite, UA: POSITIVE — AB
Protein, UR: NEGATIVE
Specific Gravity UA: 1.025 (ref 1.001–1.035)
Urine pH: 6 (ref 5.0–8.0)
Urobilinogen, UA: NEGATIVE mg/dL

## 2018-02-02 LAB — ECG 12-LEAD
Atrial Rate: 100 {beats}/min
P Axis: 56 degrees
P-R Interval: 150 ms
Q-T Interval: 358 ms
QRS Duration: 90 ms
QTC Calculation (Bezet): 461 ms
R Axis: -45 degrees
T Axis: 43 degrees
Ventricular Rate: 100 {beats}/min

## 2018-02-02 LAB — COMPREHENSIVE METABOLIC PANEL
ALT: 17 U/L (ref 0–55)
AST (SGOT): 19 U/L (ref 5–34)
Albumin/Globulin Ratio: 0.9 (ref 0.9–2.2)
Albumin: 3.8 g/dL (ref 3.5–5.0)
Alkaline Phosphatase: 156 U/L — ABNORMAL HIGH (ref 37–106)
Anion Gap: 10 (ref 5.0–15.0)
BUN: 13 mg/dL (ref 7.0–19.0)
Bilirubin, Total: 0.3 mg/dL (ref 0.2–1.2)
CO2: 20 mEq/L — ABNORMAL LOW (ref 22–29)
Calcium: 9.2 mg/dL (ref 8.5–10.5)
Chloride: 103 mEq/L (ref 100–111)
Creatinine: 0.7 mg/dL (ref 0.6–1.0)
Globulin: 4.1 g/dL — ABNORMAL HIGH (ref 2.0–3.6)
Glucose: 281 mg/dL — ABNORMAL HIGH (ref 70–100)
Potassium: 4 mEq/L (ref 3.5–5.1)
Protein, Total: 7.9 g/dL (ref 6.0–8.3)
Sodium: 133 mEq/L — ABNORMAL LOW (ref 136–145)

## 2018-02-02 LAB — GFR
EGFR: >60
EGFR: >60

## 2018-02-02 LAB — TROPONIN I: Troponin I: <0.01 ng/mL (ref 0.00–0.09)

## 2018-02-02 LAB — HEMOLYSIS INDEX
Hemolysis Index: 8 (ref 0–18)
Hemolysis Index: 10 (ref 0–18)

## 2018-02-02 LAB — BASIC METABOLIC PANEL
Anion Gap: 8 (ref 5.0–15.0)
BUN: 11 mg/dL (ref 7.0–19.0)
CO2: 18 mEq/L — ABNORMAL LOW (ref 22–29)
Calcium: 8 mg/dL — ABNORMAL LOW (ref 8.5–10.5)
Chloride: 109 mEq/L (ref 100–111)
Creatinine: 0.6 mg/dL (ref 0.6–1.0)
Glucose: 215 mg/dL — ABNORMAL HIGH (ref 70–100)
Potassium: 3.5 mEq/L (ref 3.5–5.1)
Sodium: 135 mEq/L — ABNORMAL LOW (ref 136–145)

## 2018-02-02 LAB — GLUCOSE WHOLE BLOOD - POCT
Whole Blood Glucose POCT: 216 mg/dL — ABNORMAL HIGH (ref 70–100)
Whole Blood Glucose POCT: 231 mg/dL — ABNORMAL HIGH (ref 70–100)
Whole Blood Glucose POCT: 295 mg/dL — ABNORMAL HIGH (ref 70–100)
Whole Blood Glucose POCT: 175 mg/dL — ABNORMAL HIGH (ref 70–100)
Whole Blood Glucose POCT: 284 mg/dL — ABNORMAL HIGH (ref 70–100)

## 2018-02-02 MED ORDER — ACETAMINOPHEN 325 MG PO TABS
650.00 mg | ORAL_TABLET | ORAL | Status: DC | PRN
Start: 2018-02-02 — End: 2018-02-03
  Administered 2018-02-02 (×3): 650 mg via ORAL
  Filled 2018-02-02 (×3): qty 2

## 2018-02-02 MED ORDER — INSULIN NPH (HUMAN) (ISOPHANE) 100 UNIT/ML SC SUSP
15.00 [IU] | Freq: Two times a day (BID) | SUBCUTANEOUS | Status: DC
Start: 2018-02-02 — End: 2018-02-03
  Administered 2018-02-02 – 2018-02-03 (×3): 15 [IU] via SUBCUTANEOUS
  Filled 2018-02-02 (×3): qty 45

## 2018-02-02 MED ORDER — LISINOPRIL 5 MG PO TABS
5.00 mg | ORAL_TABLET | Freq: Every day | ORAL | Status: DC
Start: 2018-02-02 — End: 2018-02-03
  Administered 2018-02-02 – 2018-02-03 (×2): 5 mg via ORAL
  Filled 2018-02-02 (×2): qty 1

## 2018-02-02 MED ORDER — ONDANSETRON HCL 4 MG/2ML IJ SOLN
4.00 mg | Freq: Four times a day (QID) | INTRAMUSCULAR | Status: DC | PRN
Start: 2018-02-02 — End: 2018-02-03

## 2018-02-02 MED ORDER — LABETALOL HCL 5 MG/ML IV SOLN (WRAP)
10.00 mg | Freq: Once | INTRAVENOUS | Status: AC
Start: 2018-02-02 — End: 2018-02-02
  Administered 2018-02-02: 01:00:00 10 mg via INTRAVENOUS
  Filled 2018-02-02: qty 4

## 2018-02-02 MED ORDER — DEXTROSE 50 % IV SOLN
12.50 g | INTRAVENOUS | Status: DC | PRN
Start: 2018-02-02 — End: 2018-02-03

## 2018-02-02 MED ORDER — SODIUM CHLORIDE 0.9 % IV MBP
1.00 g | Freq: Once | INTRAVENOUS | Status: AC
Start: 2018-02-02 — End: 2018-02-02
  Administered 2018-02-02: 01:00:00 1 g via INTRAVENOUS
  Filled 2018-02-02: qty 1000
  Filled 2018-02-02: qty 100

## 2018-02-02 MED ORDER — ONDANSETRON 4 MG PO TBDP
4.00 mg | ORAL_TABLET | Freq: Four times a day (QID) | ORAL | Status: DC | PRN
Start: 2018-02-02 — End: 2018-02-03

## 2018-02-02 MED ORDER — CEPHALEXIN 500 MG PO CAPS
500.00 mg | ORAL_CAPSULE | Freq: Two times a day (BID) | ORAL | 0 refills | Status: AC
Start: 2018-02-02 — End: 2018-02-07

## 2018-02-02 MED ORDER — INSULIN REGULAR HUMAN 100 UNIT/ML IJ SOLN
3.00 [IU] | Freq: Once | INTRAMUSCULAR | Status: AC
Start: 2018-02-02 — End: 2018-02-02
  Administered 2018-02-02: 01:00:00 3 [IU] via INTRAVENOUS
  Filled 2018-02-02: qty 9

## 2018-02-02 MED ORDER — INSULIN LISPRO 100 UNIT/ML SC SOLN
1.00 [IU] | Freq: Every evening | SUBCUTANEOUS | Status: DC
Start: 2018-02-02 — End: 2018-02-03
  Administered 2018-02-02: 23:00:00 3 [IU] via SUBCUTANEOUS
  Filled 2018-02-02: qty 9

## 2018-02-02 MED ORDER — GLUCOSE 40 % PO GEL
15.00 g | ORAL | Status: DC | PRN
Start: 2018-02-02 — End: 2018-02-03

## 2018-02-02 MED ORDER — GABAPENTIN 300 MG PO CAPS
300.00 mg | ORAL_CAPSULE | Freq: Three times a day (TID) | ORAL | Status: DC
Start: 2018-02-02 — End: 2018-02-02

## 2018-02-02 MED ORDER — HYDRALAZINE HCL 20 MG/ML IJ SOLN
10.00 mg | INTRAMUSCULAR | Status: DC | PRN
Start: 2018-02-02 — End: 2018-02-03

## 2018-02-02 MED ORDER — INSULIN LISPRO 100 UNIT/ML SC SOLN
1.00 [IU] | Freq: Three times a day (TID) | SUBCUTANEOUS | Status: DC
Start: 2018-02-02 — End: 2018-02-03
  Administered 2018-02-02: 09:00:00 3 [IU] via SUBCUTANEOUS
  Administered 2018-02-02 – 2018-02-03 (×2): 5 [IU] via SUBCUTANEOUS
  Filled 2018-02-02: qty 9
  Filled 2018-02-02 (×2): qty 15

## 2018-02-02 MED ORDER — GABAPENTIN 300 MG PO CAPS
300.00 mg | ORAL_CAPSULE | Freq: Three times a day (TID) | ORAL | Status: DC
Start: 2018-02-02 — End: 2018-02-03
  Administered 2018-02-02 – 2018-02-03 (×3): 300 mg via ORAL
  Filled 2018-02-02 (×3): qty 1

## 2018-02-02 MED ORDER — GLUCAGON 1 MG IJ SOLR (WRAP)
1.00 mg | INTRAMUSCULAR | Status: DC | PRN
Start: 2018-02-02 — End: 2018-02-03

## 2018-02-02 MED ORDER — ACETAMINOPHEN 650 MG RE SUPP
650.00 mg | RECTAL | Status: DC | PRN
Start: 2018-02-02 — End: 2018-02-03

## 2018-02-02 MED ORDER — SODIUM CHLORIDE 0.9 % IV MBP
1.00 g | INTRAVENOUS | Status: DC
Start: 2018-02-03 — End: 2018-02-03
  Administered 2018-02-03: 01:00:00 1 g via INTRAVENOUS
  Filled 2018-02-02: qty 100
  Filled 2018-02-02: qty 1000

## 2018-02-02 MED ORDER — ENOXAPARIN SODIUM 40 MG/0.4ML SC SOLN
40.00 mg | Freq: Every day | SUBCUTANEOUS | Status: DC
Start: 2018-02-02 — End: 2018-02-03
  Administered 2018-02-02 – 2018-02-03 (×2): 40 mg via SUBCUTANEOUS
  Filled 2018-02-02 (×2): qty 0.4

## 2018-02-02 MED ORDER — NALOXONE HCL 0.4 MG/ML IJ SOLN (WRAP)
0.20 mg | INTRAMUSCULAR | Status: DC | PRN
Start: 2018-02-02 — End: 2018-02-03

## 2018-02-02 NOTE — Discharge Instructions (Signed)
Infeccin del tracto urinario, inferior     Urinary Tract Infection, Lower     1.  Se le ha diagnosticado una infeccin del tracto urinario (ITU) inferior no complicada.    1.  You have been diagnosed with an uncomplicated lower urinary tract infection (UTI).              2.  Una ITU es una infeccin de la vejiga. Su mdico la diagnostic al hacer exmenes de su orina. Por lo general, una ITU causa ardor al orinar o una necesidad frecuente de orinar. Puede sentir ganas de orinar sin necesitar de hacerlo.    2.  A UTI is an infection in your bladder. Your doctor diagnosed it by testing your urine. UTIs usually causes burning with urination (peeing) or frequent urination. It might make you feel like you have to urinate even when you don't.              3.  La ITU generalmente se trata con antibiticos y analgsicos.   3.  UTI is usually treated with antibiotics and medicine to help with pain.             4.  Es MUY IMPORTANTE que surta su receta y tome todos los antibiticos como se indique. Si una infeccin del tracto urinario no se trata durante mucho tiempo, puede convertirse en una infeccin de rin.   4.  It is VERY IMPORTANT that you fill your prescription and take all of the antibiotics as directed. If a lower urinary tract infection goes untreated for too long, it can become a kidney infection.             5.  PARA LAS MUJERES: Para reducir el riesgo de volver a tener una ITU:   5.  FOR WOMEN: To reduce the risk of getting UTIs again:      * Siempre orine antes y despus de un encuentro sexual.    * Always urinate before and after sexual intercourse.      * Siempre lmpiese de delante hacia atrs despus de orinar o defecar. No se limpie de atrs hacia delante.     * Always wipe from front to back after urinating or having a bowel movement. Do not wipe from back to front.      * Beba bastantes lquidos. Trate de beber jugo de arndanos rojos o  azules. Estos jugos contienen una sustancia qumica que evita que la bacteria se pegue a la vejiga.    * Drink plenty of fluids. Try to drink cranberry or blueberry juice. These juices have a chemical that stops bacteria from sticking to the bladder.             6.  DEBE BUSCAR ATENCIN MDICA INMEDIATA, AQU O EN LA SALA DE EMERGENCIAS MS CERCANA, SI SE PRESENTA CUALQUIERA DE LAS SIGUIENTES SITUACIONES:   6.  YOU SHOULD SEEK MEDICAL ATTENTION IMMEDIATELY, EITHER HERE OR AT THE NEAREST EMERGENCY DEPARTMENT, IF ANY OF THE FOLLOWING OCCURS:      * Tiene fiebre (temperatura mayor de 100.4F / 38C) o escalofros.    * You have a fever (temperature higher than 100.4F / 38C) or shaking chills.      * Siente nuseas o vomita.    * You feel nauseated or vomit.      * Tiene dolor en un costado o en la espalda.    * You have pain in your side or back.      *   No mejora despus de tomar todos sus antibiticos.    * You don't get better after taking all of your antibiotics.      * Tiene nuevos sntomas o molestias.     * You have any new symptoms or concerns.      * Se siente peor o no mejora.     * You feel worse or do not improve.             7.  Si no puede dar seguimiento con su mdico, o si en cualquier momento cree que necesita una nueva revisin o ser atendido de [place], venga aqu o acuda a la sala de emergencias ms cercana.   7.  If you can't follow up with your doctor, or if at any time you feel you need to be rechecked or seen again, come back here or go to the nearest emergency department.              Debilidad / Fatiga     Weakness / Fatigue     1.  Usted ha sido atendido por una debilidad generalizada. Esto tambin se puede Civil engineer, contracting.   1.  You have been seen today for generalized weakness. This may also be described as fatigue.             2.  La debilidad es un problema comn, especialmente en personas mayores.   2.  Weakness is a  common problem, especially in older individuals.             3.  Es importante entender la diferencia entre debilidad verdadera (debilidad real a causa de un problema neurolgico o cerebral) y el problema ms comn de fatiga. Estas palabras pueden parecer similares pero describen [employer].   3.  It is important to understand the difference between true weakness (real weakness from a nerve or brain problem) and the more common problem of fatigue. These words might seem similar but they do mean very different problems.      * Fatiga: Adelene Amas persona describe la fatiga, pueden sentirse cansados muy rpidamente, an con [employer]. Tambin pueden decir que se sienten cansados, con sueo, se agotan fcilmente y son incapaces de Education officer, environmental sus actividades cotidianas porque parece ser que no tienen suficiente energa.     * Fatigue: When a person is describing fatigue, they may feel tired out very quickly even with just a little activity. They may also say they are feeling tired, sleepy, easily exhausted and unable to do normal daily activities because they don't seem to have enough energy.      * Debilidad verdadera: cuando una persona tiene debilidad verdadera, significa que los msculos no estn funcionando bien. Por ejemplo, una pierna puede estar verdaderamente dbil si no puede soportar su peso en ella o si no puede levantarse de una silla porque los msculos de los muslos no tienen la suficiente fuerza.     * True Weakness: When someone has true weakness, it means that the muscles are not working right. For example, a leg might be truly weak if you can't support your weight on it or if you can't get up from a chair because the thigh muscles aren't strong enough.             4.  Hay muchas causas de debilidad incluyendo: infecciones (frecuentemente infecciones del rin/vejiga o neumonas), anormalidades metablicas (bajo nivel de sodio, bajo nivel de potasio),  depresin y Building services engineer  neurolgicas (cerebro o nervios).   4.  There are many causes of weakness including; Infections (often kidney/bladder infections or pneumonias), electrolyte abnormalities (low sodium, low potassium), depression, and neurologic (brain or nerve) disorders.             5.  Despus de ver los [employer] pruebas de [place] o [place], la causa de su debilidad es:    5.  After looking at the results of the blood tests or X-rays, the cause of your weakness is:      * No definida o desconocida.    * Unclear or unknown.             6.  Es MUY IMPORTANTE darle seguimiento con su mdico familiar. Pueden necesitarse ms pruebas para determinar la causa de su debilidad.   6.  It is VERY IMPORTANT to see your primary care doctor. More testing may be needed to figure out the cause of your weakness.             7.  DEBE BUSCAR ATENCIN MDICA INMEDIATAMENTE, AQU O EN LA SALA DE EMERGENCIAS MS CERCANA, SI SE PRESENTA CUALQUIERA DE LAS SIGUIENTES SITUACIONES:   7.  YOU SHOULD SEEK MEDICAL ATTENTION IMMEDIATELY, EITHER HERE OR AT THE NEAREST EMERGENCY DEPARTMENT, IF ANY OF THE FOLLOWING OCCURS:      * Confusin, coma, agitacin (ponerse ansioso o irritable).    * Confusion, coma, agitation (becoming anxious or irritable).      * Fiebre (temperatura mayor de 100.87F / 38C), vmitos.    * Fever (temperature higher than 100.87F / 38C), vomiting.      * Fuerte dolor de [place].    * Severe headache.      * Sntomas de derrame cerebral (parlisis o entumecimiento en un lado del cuerpo, cada de un lado de la cara, dificultad para hablar).    * Signs of stroke (paralysis or numbness on one side of the body, drooping on one side of the face, difficulty talking).      * Debilidad que empeora, dificultad para permanecer de pie, parlisis, prdida de control de las funciones de orinar o defecar, o dificultad al tragar.    * Worsening weakness,  difficulty standing, paralysis, loss of control of the bladder or bowels or difficulty swallowing.                      Diabetes Mellitus, Tipo II     Diabetes Mellitus, Type II     1.  Se le ha diagnosticado diabetes tipo II.   1.  You have been diagnosed with type II diabetes.             2.  La diabetes es una enfermedad. Es cuando el cuerpo tiene problemas para regular la cantidad de International aid/development worker en el flujo sanguneo.   2.  Diabetes is a disease. It is when the body has trouble regulating the amount of sugar in the blood stream.             3.  La diabetes tipo II es conocida a veces como "diabetes del adulto". Generalmente se presenta en adultos mayores de 40. Sin embargo, la diabetes tipo II tambin se Best boy en nios y Patent attorney.   3.  Type II diabetes is sometimes called "adult-onset diabetes". It is usually found in adults older than 40. However, type II diabetes is also seen in children and young adults.  4.  La diabetes es una enfermedad muy seria. Cuando no se controla, puede causar problemas que pongan en riesgo su vida. La diabetes sin tratar puede causar problemas cardacos y renales (incluyendo insuficiencia renal). Tambin puede causar apoplejas y ceguera.   4.  Diabetes is a very serious disease. When not controlled, it can cause life-threatening problems. Untreated diabetes can lead to heart problems and kidney problems (including kidney failure). It can also lead to stroke and blindness.             5.  Su insulina o pldoras le ayudarn a mantener su nivel de azcar bajo control. Tambin siga la dieta prescrita por su mdico para mantener su nivel de azcar bajo control. La diabetes sin tratar puede causar problemas cardacos, apoplejas y ceguera. Por lo tanto, es muy importante darle seguimiento con su mdico regular.   5.  Your insulin or pills will help keep your sugar under control. Also follow the diet prescribed by  your doctor to keep your sugar under control. Untreated diabetes can cause heart problems, stroke or blindness. Therefore, it is very important for you to follow up with your regular doctor.             6.  Revise su nivel de azcar en la sangre antes de cada comida y antes de irse a dormir.   6.  Check your blood sugar before every meal and before bedtime.             7.  La insulina y otros medicamentos para la diabetes pueden hacerle sentir mareos. Puede comenzar a sudar e incluso desmayarse. Siempre cargue con usted un bocadillo que Psychologist, clinical. Si comienza a presentar estos sntomas, cmase el bocadillo para ver si sus sntomas comienzan a desaparecer. NUNCA conduzca un vehculo cuando se sienta de esta forma.   7.  Insulin and other medicines for diabetes can make you feel lightheaded. You may sweat or even pass out. Always keep a snack food that contains sugar with you. If you begin to have these symptoms, eat the snack food to see if the symptoms get better. NEVER drive a car when feeling this way.             8.  DEBE BUSCAR ATENCIN MDICA INMEDIATAMENTE, AQU O EN LA SALA DE EMERGENCIAS MS CERCANA, SI SE PRESENTA CUALQUIERA DE LAS SIGUIENTES SITUACIONES:   8.  YOU SHOULD SEEK MEDICAL ATTENTION IMMEDIATELY, EITHER HERE OR AT THE NEAREST EMERGENCY DEPARTMENT, IF ANY OF THE FOLLOWING OCCURS:      * Si tiene dolor abdominal (de estmago).    * Abdominal (belly) pain.      * Si tiene ms de 3 episodios de vmitos Programmer, multimedia).    * More than 3 vomiting (throwing up) episodes.      * Si presenta un nivel de azcar en la sangre mayor a 400 en ms de 3 ocasiones.    * Blood sugar over 400 more than 3 times.      * Si tiene fiebre (temperatura mayor de 100.29F/38C) o escalofros.    * Fever (temperature higher than 100.29F / 38C) or shaking chills.      * Si no puede retener los [employer].    * Unable to keep medicines down.      * Si tiene  problemas para tragar o respirar.    * Any trouble swallowing or breathing.      * Si tiene infeccin o sarpullido en  la piel.    * Infection or rash on your skin.      * Si presenta un bajo nivel de azcar (menos de 70) por 3 o ms ocasiones que no mejora despus de comer.    * Low blood sugar (less than 70) 3 or more times that does not get better after eating.                    Hipertensin, incidental     Hypertension, Incidental     1.  Hoy se encontr que usted tiene presin sangunea alta.    1.  Today it was found that you have high blood pressure.              2.  Una presin sangunea alta puede ser una seal de hipertensin. La presin sangunea alta puede ocurrir una vez debido a dolor o estrs. Tambin se puede presentar [employer]. Las personas que tienen alta presin sangunea todos los [address]  [address] tienen hipertensin. La hipertensin es diagnosticada cuando una persona tiene Merchandiser, retail de presin sangunea alta en al menos tres momentos diferentes. Existen causas para la presin sangunea alta que los mdicos pueden encontrar. stas incluyen tener sobrepeso o tener un problema renal u hormonal. A esto se le llama hipertensin secundaria. Cuando un mdico no conoce la causa, se le llama "hipertensin esencial". Ambos tipos de hipertensin pueden requerir medicamento para disminuir la presin sangunea. Algunas personas con presin sangunea alta mejorarn si limitan el consumo de sodio (sal) en sus dietas.   2.  High blood pressure may be a sign of hypertension. High blood pressure can happen one time because of pain or stress. It can also happen every day. People with high blood pressure every day have hypertension. Hypertension is diagnosed when a person has high blood pressure readings at least 3 different times. There are causes for high blood pressure that doctors can find. These include being overweight or having a kidney or hormone problem. This is called secondary  hypertension. When a doctor does not know the cause, it is called "essential hypertension." Both types of hypertension may require medicine to lower the blood pressure. Some people with high blood pressure will improve if they limit the sodium (salt) in their diets.             3.  A menudo, la presin sangunea alta no tiene sntomas. Sin embargo, puede causar dolores de cabeza o problemas de visin. En casos poco comunes, una presin sangunea muy alta puede causar convulsiones. Es importante diagnosticar y tratar la hipertensin aunque no haya sntomas. Esto es porque la presin sangunea alta puede daar rganos como el corazn y los riones. Este dao puede ser permanente (no se [place]).   3.  High blood pressure often has no symptoms. However, it can cause headaches or vision problems. In rare cases, very high blood pressure can cause seizures. It is important to diagnose and treat hypertension even if there are no symptoms. This is because high blood pressure can cause damage to organs like the heart and kidneys. This damage can be permanent (not go away).             4.  Debera tener otra lectura de su presin sangunea en 1 a 2 das.   4.  You should have another blood pressure reading in 1 to 2 days.             5.  Visite a su mdico lo  ms pronto posible.    5.  See your doctor as soon as possible.              6.  DEBE BUSCAR ATENCIN MDICA INMEDIATAMENTE, AQU O EN LA SALA DE EMERGENCIAS MS CERCANA, SI SE PRESENTA CUALQUIERA DE LAS SIGUIENTES SITUACIONES:   6.  YOU SHOULD SEEK MEDICAL ATTENTION IMMEDIATELY, EITHER HERE OR AT THE NEAREST EMERGENCY DEPARTMENT IF ANY OF THE FOLLOWING OCCUR:      * Tiene dolor de pecho o tiene problemas para respirar.    * Your chest hurts or you have trouble breathing.      * Tiene un fuerte dolor de cabeza o problemas para ver.    * You have a severe headache or have trouble seeing.      * Tiene convulsiones.    * You  have seizures.      * Vomita constantemente o se siente ms enfermo.    * You vomit (throw up) repeatedly or get more ill.             7.  Usted ha sido atendido por presin sangunea alta.    7.  You have been seen for high blood pressure.

## 2018-02-02 NOTE — Progress Notes (Signed)
Received pt from 2516b. Pt alert and oriented x4. Skin warm and dry. Respirations eupnea. No c/o sob, cough or dyspnea. No c/o pain or discomfort. Plan; pt was oriented to room and call bell. Will continue to monitor pt's bs and any acute change in condition.

## 2018-02-02 NOTE — H&P (Signed)
[facility]      Patient: Kelsey Miles  Date: 02/01/2018   DOB: July 14, 1981  Admission Date: 02/01/2018   MRN: [id number]  Attending: Oneida Alar         Chief Complaint   Patient presents with   . Generalized weakness   . Shortness of Breath      History Gathered From: Patient    HISTORY AND PHYSICAL     Kelsey Miles is a 37 y.o. female with a history of Diabetes and hypertension presenting with high blood pressure. Patient states that at home she was having symptoms of intermittent sharp, non-anginal chest pain associated with shortness of breath, headache, and mild dizziness. She checked her blood pressure at home and found it to be in the 180s, prompting her to come to the emergency room. In the emergency room blood pressure was in the 170s and improved with IV labetalol. She remains feeling generally unwell.    Past Medical History:   Diagnosis Date   . Diabetes    . Diabetes mellitus associated with hormonal etiology 2007   . Gout    . Hypertension        Past Surgical History:   Procedure Laterality Date   . CESAREAN SECTION  x2   . CESAREAN SECTION         Prior to Admission medications    Medication Sig Start Date End Date Taking? Authorizing Provider   insulin NPH (HUMULIN,NOVOLIN) 100 UNIT/ML injection Inject 15 Unit into the skin 2 (two) times daily before meals, in the morning and evening. 04/29/14  Yes Ralene Bathe, MD   metFORMIN (GLUCOPHAGE) 1000 MG tablet Take 1 tablet (1,000 mg total) by mouth 2 (two) times daily with meals. 01/10/13  Yes Parke Simmers, NP   cephalexin (KEFLEX) 500 MG capsule Take 1 capsule (500 mg total) by mouth 2 (two) times daily for 5 days 02/02/18 02/07/18  Marko Stai, MD   insulin lispro protamine-insulin lispro (HUMALOG 75-25) (75-25) 100 UNIT/ML SUSP Take 10 units before breakfast  & 10 units before dinner. 01/19/13   Parke Simmers, NP   Insulin Syringe-Needle U-100 (INSULIN SYRINGE .5CC/31GX5/16") 31G X 5/16" 0.5 ML Misc Insulin needle  04/29/14   Ralene Bathe, MD   lisinopril (PRINIVIL,ZESTRIL) 5 MG tablet Take 1 tablet (5 mg total) by mouth daily. 01/10/13   Parke Simmers, NP   metFORMIN (GLUCOPHAGE) 1000 MG tablet Take 1,000 mg by mouth 2 (two) times daily with meals.    [provider]   naproxen (NAPROSYN) 500 MG tablet Take 1 tablet (500 mg total) by mouth 2 (two) times daily with meals. 11/22/15   Carmon Sails, MD   ondansetron (ZOFRAN ODT) 4 MG disintegrating tablet 1-2 every 6 hrs as needed 11/22/15   Carmon Sails, MD   Vitamin D, Ergocalciferol, (DRISDOL) 50000 UNIT Cap Take 1 capsule (50,000 Units total) by mouth once a week. 05/06/14   Ralene Bathe, MD       No Known Allergies    PRIMARY CARE MD: Christa See, MD    Family History   Problem Relation Age of Onset   . Diabetes Father    . Diabetes Brother    . Diabetes Sister       Social History   Substance Use Topics   . Smoking status: Never Smoker   . Smokeless tobacco: Never Used   . Alcohol use No  REVIEW OF SYSTEMS   Pertinent positives are noted in HPI. Review of Systems completed and all other systems were reviewed and are negative.     PHYSICAL EXAM   Vital Signs (most recent): BP 137/84   Pulse (!) 102   Temp 98.7 F (37.1 C) (Oral)   Resp 20   Wt 61.4 kg (135 lb 6.4 oz)   SpO2 100%   BMI 23.24 kg/m     Constitutional: No apparent distress.    HEENT: NC/AT, PERRL, no scleral icterus or conjunctival pallor, MMM, oropharynx without erythema or exudate  Neck: supple, no cervical or supraclavicular lymphadenopathy or masses  Cardiovascular: RRR, normal S1 S2, no murmurs, gallops, no JVD. No edema.  Respiratory: Normal rate. No increased work of breathing. Clear to auscultation and percussion bilaterally.   Gastrointestinal: +BS, non-distended, soft, non-tender, no rebound or guarding, no hepatosplenomegaly  Genitourinary: no suprapubic or costovertebral angle tenderness  Musculoskeletal: ROM and motor strength grossly normal. No clubbing or  cyanosis. DP and radial pulses 2+ and symmetric.  Skin: warm and perfused. No rash or lesions.  Neurologic: AAOx3, EOMI, CN 2-12 grossly intact. no gross motor or sensory deficits  Psychiatric: Normal mood and affect. The patient is alert, interactive, appropriate.    Lines/Drains/Airways:  Patient Lines/Drains/Airways Status    Active Lines, Drains and Airways     Name:   Placement date:   Placement time:   Site:   Days:    Peripheral IV 02/01/18 Right Antecubital  02/01/18    2326    Antecubital    less than 1                Exam done by Oneida Alar, MD on 02/02/18 at 2:40 AM    LABS & IMAGING     Recent Labs      02/01/18   2322   WBC  8.70   Hgb  11.8   Hematocrit  37.4   Platelets  394*     No results for input(s): PT, INR, PTT in the last 72 hours.   Recent Labs  Lab 02/01/18  2322   Sodium 133*   Potassium 4.0   Chloride 103   CO2 20*   BUN 13.0   Creatinine 0.7   Calcium 9.2   Albumin 3.8   Protein, Total 7.9   Bilirubin, Total 0.3   Alkaline Phosphatase 156*   ALT 17   AST (SGOT) 19   Glucose 281*             Microbiology:   Microbiology Results     Procedure Component Value Units Date/Time    Rapid influenza A/B antigens [[id number]] Collected:  02/01/18 2138    Specimen:  Nasopharyngeal from Nasal Aspirate Updated:  02/01/18 2207    Narrative:       ORDER#: [id number]                                    ORDERED BY: Shawnie Pons, MUR  SOURCE: Nasal Aspirate                               COLLECTED:  02/01/18 21:38  ANTIBIOTICS AT COLL.:  RECEIVED :  02/01/18 21:41  Influenza Rapid Antigen A&B                FINAL       02/01/18 22:07  02/01/18   Negative for Influenza A and B             Reference Range: Negative      Urine culture [[id number]] Collected:  02/01/18 2319    Specimen:  Urine from Urine, Clean Catch Updated:  02/02/18 0059    Narrative:       Replace urinary catheter prior to obtaining the urine culture  if it has been in place for greater than or equal to  14  days:->N/A No Foley  Indications for Urine Culture:->Suprapubic Pain/Tenderness or  Dysuria          Imaging:  Xr Chest 2 Views    Result Date: 02/01/2018   No active cardiopulmonary disease.    Heron Nay, MD 02/01/2018 10:30 PM         ASSESSMENT & PLAN     Caitlin Ainley Nashali Ditmer is a 37 y.o. female with a history of Hypertension and diabetes presenting with hypertensive urgency in the setting of urinary tract infection    # Hypertensive urgency  -Continue lisinopril  -Hydralazine as needed  -Continue to monitor on telemetry  -Tylenol for headache    # Urinary tract infection  -Continue empiric antibiotics with ceftriaxone  -Follow-up blood and urine cultures  -Tylenol for pain and fever as needed  -Zofran for nausea as needed    # Diabetes mellitus  -Continue insulin NPH  -Sliding scale insulin    # Nutrition  Regular diet    # VTE Prophylaxis  Lovenox    # CODE STATUS: Full Code    Anticipated medical stability for discharge: 2-3 days    Service status/Reason for ongoing hospitalization: Urinary tract infection with hypertension  Anticipated Discharge Needs: none    Signed,  Oneida Alar    02/02/2018 2:40 AM  Time Elapsed:  45 mins

## 2018-02-02 NOTE — ED Notes (Addendum)
NURSING NOTE FOR THE RECEIVING INPATIENT NURSE   ED [place], RN   [place] [address]   ADMISSION INFORMATION   Kelsey Miles is a 37 y.o. female admitted with a diagnosis of:    1. Hypertensive urgency    2. Acute UTI    3. SOB (shortness of breath)    4. Fatigue, unspecified type    5. Type 2 diabetes mellitus with hyperglycemia, with long-term current use of insulin    6. Near syncope       Isolation: None  Holding Orders Confirmed:  yes   NURSING CARE   Mental Status alert and oriented   ADL ADLs:            Independent with all ADLs  Ambulation:  mild difficulty   Pertinent Information  and Safety Concerns Pt is AXOx4. Primarily spanish speaking. Pt is able to ambulate, void, and eliminate independently. Currently has rocephin running.      VITAL SIGNS     Time of Last Set of Vitals 0100   Temperature 97.5   BP 135/85   Pulse 99   Respirations 19   Pulse OX 100%        IV LINES     IV Catheter Size: 20 g IV SONO used    Peripheral IV 02/01/18 Right Antecubital (Active)   Site Assessment Clean;Dry;Intact 02/01/2018 11:26 PM   Line Status Infusing 02/01/2018 11:26 PM   Dressing Status Dry;Intact;Clean 02/01/2018 11:26 PM   Number of days: 0          LAB RESULTS     Labs Reviewed   CBC AND DIFFERENTIAL - Abnormal; Notable for the following:        Result Value    Platelets 394 (*)     MCV 76.6 (*)     MCH 24.2 (*)     All other components within normal limits   COMPREHENSIVE METABOLIC PANEL - Abnormal; Notable for the following:     Glucose 281 (*)     Sodium 133 (*)     CO2 20 (*)     Alkaline Phosphatase 156 (*)     Globulin 4.1 (*)     All other components within normal limits   URINALYSIS, REFLEX TO MICROSCOPIC EXAM IF INDICATED - Abnormal; Notable for the following:     Leukocyte Esterase, UA Trace (*)     Nitrite, UA Positive (*)     Glucose, UA 500 (*)     RBC, UA 6 - 10 (*)     WBC, UA 11 - 25 (*)     All other components within normal limits   GLUCOSE WHOLE BLOOD - POCT - Abnormal; Notable for the  following:     POCT - Glucose Whole blood 289 (*)     All other components within normal limits   RAPID INFLUENZA A/B ANTIGENS    Narrative:     ORDER#: [id number]                                    ORDERED BY: SHAHKOLAHI, MUR  SOURCE: Nasal Aspirate                               COLLECTED:  02/01/18 21:38  ANTIBIOTICS AT COLL.:  RECEIVED :  02/01/18 21:41  Influenza Rapid Antigen A&B                FINAL       02/01/18 22:07  02/01/18   Negative for Influenza A and B             Reference Range: Negative     URINE CULTURE    Narrative:     Replace urinary catheter prior to obtaining the urine culture  if it has been in place for greater than or equal to 14  days:->N/A No Foley  Indications for Urine Culture:->Suprapubic Pain/Tenderness or  Dysuria   URINE [facility]CG POC SOFT   IHS D-DIMER   TROPONIN I   HEMOLYSIS INDEX   GFR

## 2018-02-02 NOTE — UM Notes (Signed)
02/02/18 0053  Place (admit) for Observation Services (Adult Observation Admit Panel (AX))     Admitting Physician Valentino Nose E   Diagnosis Hypertensive urgency   Estimated Length of Stay < 2 midnights   Tentative Discharge Plan? Home or Self Care   Patient Class Observation     No Payor Identified     37 y.o. female with a history of Diabetes and hypertension presenting with high blood pressure. Patient states that at home she was having symptoms of intermittent sharp, non-anginal chest pain associated with shortness of breath, headache, and mild dizziness. She checked her blood pressure at home and found it to be in the 180s, prompting her to come to the emergency room. In the emergency room blood pressure was in the 170s and improved with IV labetalol. She remains feeling generally unwell.    BP 137/84   Pulse (!) 102   Temp 98.7 F (37.1 C) (Oral)   Resp 20   Wt 61.4 kg (135 lb 6.4 oz)   SpO2 100%   BMI 23.24 kg/m     Platelets 394  Na 133  CO2 20  Alk PHos 156  Glucose 281    Chest x-ray:  No active cardiopulmonary disease    ASSESSMENT & PLAN    # Hypertensive urgency  -Continue lisinopril  -Hydralazine as needed  -Continue to monitor on telemetry  -Tylenol for headache    # Urinary tract infection  -Continue empiric antibiotics with ceftriaxone  -Follow-up blood and urine cultures  -Tylenol for pain and fever as needed  -Zofran for nausea as needed    # Diabetes mellitus  -Continue insulin NPH  -Sliding scale insulin    # Nutrition  Regular diet    # VTE Prophylaxis  Lovenox    # CODE STATUS: Full Code    Anticipated medical stability for discharge: 2-3 days    Service status/Reason for ongoing hospitalization: Urinary tract infection with hypertension    Scheduled Meds:  Current Facility-Administered Medications   Medication Dose Route Frequency   . [START ON 02/03/2018] cefTRIAXone  1 g Intravenous Q24H   . enoxaparin  40 mg Subcutaneous Daily   . insulin lispro  1-4 Units  Subcutaneous QHS   . insulin lispro  1-8 Units Subcutaneous TID AC   . insulin NPH  15 Units Subcutaneous BID AC   . lisinopril  5 mg Oral Daily     PRN Meds:.acetaminophen **OR** acetaminophen, Nursing communication: Adult Hypoglycemia Treatment Algorithm **AND** dextrose **AND** dextrose **AND** glucagon (rDNA), hydrALAZINE, naloxone, ondansetron **OR** ondansetron    Elfredia Nevins, RN, BSN  Utilization Review Case Manager  Case Management  [facility]   [address]  [place], [place]  [address]  T [phone]  Judie Petit [phone] F [phone]

## 2018-02-02 NOTE — Progress Notes (Signed)
 [facility] ([facility])    Received a referral to schedule a follow up appointment with the [facility].  Appointment scheduled for 5/2  at 10:00 am with NP Abbasi,Farideh  at [facility]  location.      Clinic address is as follows:    [address] [place], [address]    Please notify patient to arrive 15 minutes early to the appointment and bring the following materials with them:    Insurance card (if insured) and photo ID  Medications in their original bottles  Glucometer/blood sugar log (if diabetic)  Weight log (if heart failure)  Proof of income (to enroll in medication assistance programs-first two pages of signed 1040 tax forms or last 2 months of pay stubs)      Leandra Kern Reg Rep II  [employer]  T [phone] F [phone]

## 2018-02-02 NOTE — Progress Notes (Signed)
[facility]  PROGRESS NOTE      Patient: Kelsey Miles  Date: 02/02/2018   LOS: 0 Days  Admission Date: 02/01/2018   MRN: [id number]  Attending: Asher Muir Jonesha Tsuchiya  Please contact me on the following Spectralink        ASSESSMENT/PLAN   :       Assessment:  This is a 37 y.o. female with a history of Hypertension and diabetes presenting with urinary tract infection      Plan:     # Acute Pyonephritis   On ceftriaxone  Follow-up blood and urine cultures  Tylenol for pain and fever as needed  Zofran for nausea as needed    # DM neuropathy  Will start patient on Neurontin     # Hypertensive urgency, controlled   Patient BP within the normal range today   Continue lisinopril  Hydralazine as needed  Monitor BP     # Diabetes mellitus  Continue insulin NPH  Sliding scale insulin  Monitor FS     # DVT Prophylaxis           Code Status: full            SUBJECTIVE     Patient was seen an the bedside. Patient complai from flank tenderness.     MEDICATIONS     Current Facility-Administered Medications   Medication Dose Route Frequency   . [START ON 02/03/2018] cefTRIAXone  1 g Intravenous Q24H   . enoxaparin  40 mg Subcutaneous Daily   . gabapentin  300 mg Oral Q8H SCH   . insulin lispro  1-4 Units Subcutaneous QHS   . insulin lispro  1-8 Units Subcutaneous TID AC   . insulin NPH  15 Units Subcutaneous BID AC   . lisinopril  5 mg Oral Daily       PHYSICAL EXAM     Vitals:    02/02/18 1144   BP: 140/77   Pulse: 100   Resp: 16   Temp: 97.7 F (36.5 C)   SpO2: 99%       Temperature: Temp  Min: 97 F (36.1 C)  Max: 98.9 F (37.2 C)  Pulse: Pulse  Min: 89  Max: 104  Respiratory: Resp  Min: 16  Max: 22  Non-Invasive BP: BP  Min: 126/78  Max: 170/105  Pulse Oximetry SpO2  Min: 97 %  Max: 100 %    Intake and Output Summary (Last 24 hours) at Date Time  No intake or output data in the 24 hours ending 02/02/18 1529      GEN APPEARANCE: Normal;  A&OX3  HEENT: PERLA; EOMI; Conjunctiva Clear  NECK: Supple; No bruits  GU: flank  tenderness   CVS: RRR, S1, S2; No M/G/R  LUNGS: CTAB; No Wheezes; No Rhonchi: No rales  ABD: Soft; No TTP; + Normoactive BS  EXT: No edema; Pulses 2+ and intact  NEURO: CN 2-12 intact; No Focal neurological deficits      Exam done by Noralee Space, MD on 02/02/18 at 3:29 PM      LABS       Recent Labs  Lab 02/02/18  0415 02/01/18  2322   WBC 8.99 8.70   RBC 3.99 4.88   Hgb 9.6* 11.8   Hematocrit 30.7* 37.4   MCV 76.9* 76.6*   Platelets 355* 394*         Recent Labs  Lab 02/02/18  0415 02/01/18  2322   Sodium 135* 133*  Potassium 3.5 4.0   Chloride 109 103   CO2 18* 20*   BUN 11.0 13.0   Creatinine 0.6 0.7   Glucose 215* 281*   Calcium 8.0* 9.2         Recent Labs  Lab 02/01/18  2322   ALT 17   AST (SGOT) 19   Bilirubin, Total 0.3   Albumin 3.8   Alkaline Phosphatase 156*         Recent Labs  Lab 02/01/18  2322   Troponin I <0.01             Microbiology Results     Procedure Component Value Units Date/Time    Rapid influenza A/B antigens [[id number]] Collected:  02/01/18 2138    Specimen:  Nasopharyngeal from Nasal Aspirate Updated:  02/01/18 2207    Narrative:       ORDER#: [id number]                                    ORDERED BY: Shawnie Pons, MUR  SOURCE: Nasal Aspirate                               COLLECTED:  02/01/18 21:38  ANTIBIOTICS AT COLL.:                                RECEIVED :  02/01/18 21:41  Influenza Rapid Antigen A&B                FINAL       02/01/18 22:07  02/01/18   Negative for Influenza A and B             Reference Range: Negative      Urine culture [[id number]] Collected:  02/01/18 2319    Specimen:  Urine from Urine, Clean Catch Updated:  02/02/18 0507    Narrative:       Replace urinary catheter prior to obtaining the urine culture  if it has been in place for greater than or equal to 14  days:->N/A No Foley  Indications for Urine Culture:->Suprapubic Pain/Tenderness or  Dysuria           RADIOLOGY     Upon my review:    Cephus Slater Izora Benn  3:29 PM 02/02/2018

## 2018-02-02 NOTE — ED Notes (Signed)
ED Nursing Note For The Receiving Inpatient Nurse - UPDATE     The following information is an update to the previous ED Nursing Admission Note:    Pt began to have diarrhea x1 episode. Admission MD aware.

## 2018-02-02 NOTE — Plan of Care (Signed)
Problem: Diabetes: Glucose Imbalance  Goal: Blood glucose stable at established goal   02/02/18 0451   Goal/Interventions addressed this shift   Blood glucose stable at established goal  Monitor lab values;Monitor intake and output. Notify LIP if urine output is < 30 mL/hour.;Include patient/family in decisions related to nutrition/dietary selections;Assess for hypoglycemia /hyperglycemia;Monitor/assess vital signs;Ensure appropriate diet and assess tolerance;Ensure appropriate consults are obtained (Nutrition, Diabetes Education, and Case Management);Ensure patient/family has adequate teaching materials   Admitted from ed 36y, hispanic, f with dx uti, hypertensive urgency, elevated blood glucose level  A/o x4, SR- ST on the monitor,  On iv antibiotic for uti  Prn blood pressure medication  lovenox for dvt prophylaxis.

## 2018-02-03 LAB — GFR: EGFR: >60

## 2018-02-03 LAB — BASIC METABOLIC PANEL
Anion Gap: 10 (ref 5.0–15.0)
BUN: 13 mg/dL (ref 7.0–19.0)
CO2: 20 mEq/L — ABNORMAL LOW (ref 22–29)
Calcium: 8.7 mg/dL (ref 8.5–10.5)
Chloride: 105 mEq/L (ref 100–111)
Creatinine: 0.7 mg/dL (ref 0.6–1.0)
Glucose: 311 mg/dL — ABNORMAL HIGH (ref 70–100)
Potassium: 4.4 mEq/L (ref 3.5–5.1)
Sodium: 135 mEq/L — ABNORMAL LOW (ref 136–145)

## 2018-02-03 LAB — CBC
Absolute NRBC: 0 10*3/uL (ref 0.00–0.00)
Hematocrit: 32.5 % — ABNORMAL LOW (ref 34.7–43.7)
Hgb: 10.2 g/dL — ABNORMAL LOW (ref 11.4–14.8)
MCH: 24.2 pg — ABNORMAL LOW (ref 25.1–33.5)
MCHC: 31.4 g/dL — ABNORMAL LOW (ref 31.5–35.8)
MCV: 77.2 fL — ABNORMAL LOW (ref 78.0–96.0)
MPV: 9.6 fL (ref 8.9–12.5)
Nucleated RBC: 0 /100 WBC (ref 0.0–0.0)
Platelets: 344 10*3/uL (ref 142–346)
RBC: 4.21 10*6/uL (ref 3.90–5.10)
RDW: 15 % (ref 11–15)
WBC: 7.68 10*3/uL (ref 3.10–9.50)

## 2018-02-03 LAB — HEMOLYSIS INDEX: Hemolysis Index: 12 (ref 0–18)

## 2018-02-03 LAB — GLUCOSE WHOLE BLOOD - POCT
Whole Blood Glucose POCT: 296 mg/dL — ABNORMAL HIGH (ref 70–100)
Whole Blood Glucose POCT: 249 mg/dL — ABNORMAL HIGH (ref 70–100)

## 2018-02-03 MED ORDER — GABAPENTIN 300 MG PO CAPS
300.00 mg | ORAL_CAPSULE | Freq: Three times a day (TID) | ORAL | 0 refills | Status: DC
Start: 2018-02-03 — End: 2018-05-30

## 2018-02-03 MED ORDER — CIPROFLOXACIN HCL 500 MG PO TABS
500.00 mg | ORAL_TABLET | Freq: Two times a day (BID) | ORAL | Status: DC
Start: 2018-02-03 — End: 2018-02-03

## 2018-02-03 MED ORDER — CIPROFLOXACIN HCL 500 MG PO TABS
500.00 mg | ORAL_TABLET | Freq: Two times a day (BID) | ORAL | 0 refills | Status: AC
Start: 2018-02-03 — End: 2018-02-10

## 2018-02-03 NOTE — Plan of Care (Signed)
Problem: Safety  Goal: Patient will be free from injury during hospitalization  Outcome: Progressing   02/03/18 0517   Goal/Interventions addressed this shift   Patient will be free from injury during hospitalization  Assess patient's risk for falls and implement fall prevention plan of care per policy;Provide and maintain safe environment;Use appropriate transfer methods;Ensure appropriate safety devices are available at the bedside;Include patient/ family/ care giver in decisions related to safety;Hourly rounding       Problem: Pain  Goal: Pain at adequate level as identified by patient  Outcome: Progressing   02/03/18 0517   Goal/Interventions addressed this shift   Pain at adequate level as identified by patient Identify patient comfort function goal;Assess for risk of opioid induced respiratory depression, including snoring/sleep apnea. Alert healthcare team of risk factors identified.;Assess pain on admission, during daily assessment and/or before any "as needed" intervention(s);Reassess pain within 30-60 minutes of any procedure/intervention, per Pain Assessment, Intervention, Reassessment (AIR) Cycle;Evaluate if patient comfort function goal is met;Evaluate patient's satisfaction with pain management progress     Pt. AOx4, RA-lungs clear. No tele. Pt. Given PO tylenol x1 for headache. Pt. receiving IV abx. BS elevated and coverage was needed x1 on shift. Pt. Was able to give herself the insulin. Pt. Is able to ambulate OOB independently. VS stable. Will continue to monitor.     Problem: Diabetes: Glucose Imbalance  Goal: Blood glucose stable at established goal  Outcome: Progressing   02/03/18 0517   Goal/Interventions addressed this shift   Blood glucose stable at established goal  Monitor lab values;Monitor intake and output. Notify LIP if urine output is < 30 mL/hour.;Include patient/family in decisions related to nutrition/dietary selections;Assess for hypoglycemia /hyperglycemia;Monitor/assess vital  signs;Coordinate medication administration with meals, as indicated;Ensure appropriate diet and assess tolerance;Ensure adequate hydration

## 2018-02-03 NOTE — Progress Notes (Signed)
Patient discharged home. Discharge instruction's and follow-up appointment's given, patient verbalized understanding's. Denies pain, or SOB. IV access line removed. Patient left the unit in stable condition. No distress noted.

## 2018-02-03 NOTE — UM Notes (Signed)
Primary Payor: CHARITY/PENDING CHARITY  CSR: 02/03/18    Delton Prairie, Bonni M    BP 121/76   Pulse 82   Temp (!) 96.3 F (35.7 C) (Oral)   Resp 16   Ht 1.499 m (4\' 11" )   Wt 61.8 kg (136 lb 4.8 oz)   SpO2 99%   BMI 27.53 kg/m     Temp:  [96 F (35.6 C)-97.7 F (36.5 C)]   Heart Rate:  [82-101]   Resp Rate:  [16-21]   BP: (98-140)/(64-78)   SpO2:  [98 %-99 %]   Height:  [149.9 cm (4\' 11" )]   Weight:  [61.8 kg (136 lb 3.2 oz)-61.8 kg (136 lb 4.8 oz)]   BMI (calculated):  [27.6]     Last recorded pain score:  Pain Scale Used: Numeric Scale (0-10)  Pain Score: 0-No pain   O2: 99% RA    Lab Results last 48 Hours     Procedure Component Value Units Date/Time    Glucose Whole Blood - POCT [[id number]]  (Abnormal) Collected:  02/03/18 0745     Updated:  02/03/18 0825     POCT - Glucose Whole blood 296 (H) mg/dL     Urine culture [[id number]] Collected:  02/01/18 2319    Specimen:  Urine from Urine, Clean Catch Updated:  02/03/18 0432    Narrative:       Replace urinary catheter prior to obtaining the urine culture  if it has been in place for greater than or equal to 14  days:->N/A No Foley  Indications for Urine Culture:->Suprapubic Pain/Tenderness or  Dysuria  ORDER#: [id number]                                    ORDERED BY: Shawnie Pons, MUR  SOURCE: Urine, Clean Catch                           COLLECTED:  02/01/18 23:19  ANTIBIOTICS AT COLL.:                                RECEIVED :  02/02/18 05:07  Culture Urine                              PRELIM      02/03/18 04:32   +  02/03/18   >100,000 CFU/ML Gram negative rod               Further workup to follow including susceptibility testing        Glucose Whole Blood - POCT [[id number]]  (Abnormal) Collected:  02/02/18 2138     Updated:  02/02/18 2145     POCT - Glucose Whole blood 284 (H) mg/dL     Glucose Whole Blood - POCT [[id number]]  (Abnormal) Collected:  02/02/18 1604     Updated:  02/02/18 1649     POCT - Glucose Whole blood 175 (H) mg/dL     Glucose Whole  Blood - POCT [[id number]]  (Abnormal) Collected:  02/02/18 1127     Updated:  02/02/18 1137     POCT - Glucose Whole blood 295 (H) mg/dL     Glucose Whole Blood - POCT [[id number]]  (Abnormal) Collected:  02/02/18 7425     Updated:  02/02/18 9563  POCT - Glucose Whole blood 231 (H) mg/dL     GFR [[id number]] Collected:  02/02/18 0415     Updated:  02/02/18 0631     EGFR >60.0    Basic Metabolic Panel [[id number]]  (Abnormal) Collected:  02/02/18 0415    Specimen:  Blood Updated:  02/02/18 0631     Glucose 215 (H) mg/dL      BUN 51.8 mg/dL      Creatinine 0.6 mg/dL      Calcium 8.0 (L) mg/dL      Sodium 841 (L) mEq/L      Potassium 3.5 mEq/L      Chloride 109 mEq/L      CO2 18 (L) mEq/L      Anion Gap 8.0    Hemolysis index [[id number]] Collected:  02/02/18 0415     Updated:  02/02/18 0631     Hemolysis Index 10    CBC [[id number]]  (Abnormal) Collected:  02/02/18 0415    Specimen:  Blood from Blood Updated:  02/02/18 0603     WBC 8.99 x10 3/uL      Hgb 9.6 (L) g/dL      Hematocrit 32.2 (L) %      Platelets 355 (H) x10 3/uL      RBC 3.99 x10 6/uL      MCV 76.9 (L) fL      MCH 24.1 (L) pg      MCHC 31.3 (L) g/dL      RDW 14 %      MPV 10.0 fL      Nucleated RBC 0.0 /100 WBC      Absolute NRBC 0.00 x10 3/uL     Glucose Whole Blood - POCT [[id number]]  (Abnormal) Collected:  02/02/18 0122     Updated:  02/02/18 0125     POCT - Glucose Whole blood 216 (H) mg/dL     UA, Reflex to Microscopic (pts 3 + yrs) [[id number]]  (Abnormal) Collected:  02/01/18 2319    Specimen:  Urine Updated:  02/02/18 0014     Urine Type Clean Catch     Color, UA Yellow     Clarity, UA Clear     Specific Gravity UA 1.025     Urine pH 6.0     Leukocyte Esterase, UA Trace (A)     Nitrite, UA Positive (A)     Protein, UR Negative     Glucose, UA 500 (A)     Ketones UA Negative     Urobilinogen, UA Negative mg/dL      Bilirubin, UA Negative     Blood, UA Negative     RBC, UA 6 - 10 (A) /hpf      WBC, UA 11 - 25 (A) /hpf      Squamous Epithelial Cells,  Urine 0 - 5 /hpf     Troponin I [[id number]] Collected:  02/01/18 2322    Specimen:  Blood Updated:  02/02/18 0012     Troponin I <0.01 ng/mL     GFR [[id number]] Collected:  02/01/18 2322     Updated:  02/02/18 0005     EGFR >60.0    Comprehensive metabolic panel [[id number]]  (Abnormal) Collected:  02/01/18 2322    Specimen:  Blood Updated:  02/02/18 0005     Glucose 281 (H) mg/dL      BUN 38.1 mg/dL      Creatinine 0.7 mg/dL      Sodium 829 (L) mEq/L  Potassium 4.0 mEq/L      Chloride 103 mEq/L      CO2 20 (L) mEq/L      Calcium 9.2 mg/dL      Protein, Total 7.9 g/dL      Albumin 3.8 g/dL      AST (SGOT) 19 U/L      ALT 17 U/L      Alkaline Phosphatase 156 (H) U/L      Bilirubin, Total 0.3 mg/dL      Globulin 4.1 (H) g/dL      Albumin/Globulin Ratio 0.9     Anion Gap 10.0    Hemolysis index [[id number]] Collected:  02/01/18 2322     Updated:  02/02/18 0005     Hemolysis Index 8    D-Dimer [[id number]] Collected:  02/01/18 2322     Updated:  02/01/18 2358     D-Dimer 0.32 ug/mL FEU     CBC with differential [[id number]]  (Abnormal) Collected:  02/01/18 2322    Specimen:  Blood from Blood Updated:  02/01/18 2348     WBC 8.70 x10 3/uL      Hgb 11.8 g/dL      Hematocrit 03.4 %      Platelets 394 (H) x10 3/uL      RBC 4.88 x10 6/uL      MCV 76.6 (L) fL      MCH 24.2 (L) pg      MCHC 31.6 g/dL      RDW 15 %      MPV 9.7 fL      Neutrophils 52.3 %      Lymphocytes Automated 35.5 %      Monocytes 6.8 %      Eosinophils Automated 4.4 %      Basophils Automated 0.9 %      Immature Granulocyte 0.1 %      Nucleated RBC 0.0 /100 WBC      Neutrophils Absolute 4.55 x10 3/uL      Abs Lymph Automated 3.09 x10 3/uL      Abs Mono Automated 0.59 x10 3/uL      Abs Eos Automated 0.38 x10 3/uL      Absolute Baso Automated 0.08 x10 3/uL      Absolute Immature Granulocyte 0.01 x10 3/uL      Absolute NRBC 0.00 x10 3/uL     Rapid influenza A/B antigens [[id number]] Collected:  02/01/18 2138    Specimen:  Nasopharyngeal from Nasal Aspirate  Updated:  02/01/18 2207    Narrative:       ORDER#: [id number]                                    ORDERED BY: Shawnie Pons, MUR  SOURCE: Nasal Aspirate                               COLLECTED:  02/01/18 21:38  ANTIBIOTICS AT COLL.:                                RECEIVED :  02/01/18 21:41  Influenza Rapid Antigen A&B                FINAL       02/01/18 22:07  02/01/18   Negative for Influenza A and B  Reference Range: Negative      Urine BHCG POC [[id number]] Collected:  02/01/18 2145     Updated:  02/01/18 2151     Urine bHCG POC Negative    Glucose Whole Blood - POCT [[id number]]  (Abnormal) Collected:  02/01/18 2136     Updated:  02/01/18 2141     POCT - Glucose Whole blood 289 (H) mg/dL         MD Note 7/82/95  Acute Pyonephritis   On ceftriaxone  DM neuropathy  Will start patient on Neurontin  Hypertensive urgency, controlled   Patient BP within the normal range yesterday  Continue lisinopril  Hydralazine as needed  Monitor BP     Ky Barban  MS Health Informatics/Healthcare Administration, Cert. Pension scheme manager, Comptroller, BS Biology   Utilization Review Case Manager  Case Management Department   [facility]  [address]  [place], [place] [address]  T [phone] Judie Petit [phone]  [email or web address]  Please submit all clinical review request via fax to [phone]

## 2018-02-03 NOTE — Plan of Care (Signed)
Problem: Safety  Goal: Patient will be free from injury during hospitalization   02/03/18 1059   Goal/Interventions addressed this shift   Patient will be free from injury during hospitalization  Assess patient's risk for falls and implement fall prevention plan of care per policy;Provide and maintain safe environment;Use appropriate transfer methods;Ensure appropriate safety devices are available at the bedside;Include patient/ family/ care giver in decisions related to safety;Assess for patients risk for elopement and implement Elopement Risk Plan per policy;Hourly rounding       Problem: Pain  Goal: Pain at adequate level as identified by patient   02/03/18 1059   Goal/Interventions addressed this shift   Pain at adequate level as identified by patient Identify patient comfort function goal;Assess for risk of opioid induced respiratory depression, including snoring/sleep apnea. Alert healthcare team of risk factors identified.;Evaluate if patient comfort function goal is met;Evaluate patient's satisfaction with pain management progress;Consult/collaborate with Pain Service;Offer non-pharmacological pain management interventions;Consult/collaborate with Physical Therapy, Occupational Therapy, and/or Speech Therapy;Include patient/patient care companion in decisions related to pain management as needed;Assess pain on admission, during daily assessment and/or before any "as needed" intervention(s)       Problem: Diabetes: Glucose Imbalance  Goal: Blood glucose stable at established goal   02/03/18 1059   Goal/Interventions addressed this shift   Blood glucose stable at established goal  Monitor lab values;Monitor intake and output. Notify LIP if urine output is < 30 mL/hour.;Include patient/family in decisions related to nutrition/dietary selections;Assess for hypoglycemia /hyperglycemia;Monitor/assess vital signs;Coordinate medication administration with meals, as indicated;Ensure appropriate diet and assess  tolerance;Ensure adequate hydration;Ensure patient/family has adequate teaching materials;Ensure appropriate consults are obtained (Nutrition, Diabetes Education, and Case Management)

## 2018-02-03 NOTE — Discharge Summary (Signed)
[facility]      Patient: Kelsey Miles  Admission Date: 02/01/2018   DOB: 04-Nov-1980  Discharge Date: 02/03/2018   MRN: [id number]  Discharge Attending:Brick Ketcher Vashti Hey     Referring Physician: Christa See, MD  PCP: Christa See, MD       DISCHARGE SUMMARY     Discharge Information   Admission Diagnosis:   <principal problem not specified>    Discharge Diagnosis:   Active Hospital Problems    Diagnosis   . Hypertensive urgency        Admission Condition: Guarded  Discharge Condition: Stable  Consultants:   Functional Status:   Discharged to:   Procedures:  Surgeries:  Procedures:    Imaging:     Xr Chest 2 Views    Result Date: 02/01/2018   No active cardiopulmonary disease.    Heron Nay, MD 02/01/2018 10:30 PM     Echo Results     None        Discharge Medications:     Medication List      START taking these medications    cephalexin 500 MG capsule  Commonly known as:  KEFLEX  Take 1 capsule (500 mg total) by mouth 2 (two) times daily for 5 days     ciprofloxacin 500 MG tablet  Commonly known as:  CIPRO  Take 1 tablet (500 mg total) by mouth every 12 (twelve) hours for 7 days     gabapentin 300 MG capsule  Commonly known as:  NEURONTIN  Take 1 capsule (300 mg total) by mouth every 8 (eight) hours        CHANGE how you take these medications    metFORMIN 1000 MG tablet  Commonly known as:  GLUCOPHAGE  Take 1 tablet (1,000 mg total) by mouth 2 (two) times daily with meals.  What changed:  Another medication with the same name was removed. Continue taking this medication, and follow the directions you see here.        CONTINUE taking these medications    HUMULIN N 100 UNIT/ML injection  Generic drug:  insulin NPH  Inject 15 Unit into the skin 2 (two) times daily before meals, in the morning and evening.     insulin lispro protamine-lispro 75-25 MIXTURE (75-25) 100 UNIT/ML Susp injection  Commonly known as:  HumaLOG MIX 75-25  Take 10 units before breakfast  & 10 units before dinner.      INSULIN SYRINGE .5CC/31GX5/16" 31G X 5/16" 0.5 ML Misc  Insulin needle     lisinopril 5 MG tablet  Commonly known as:  PRINIVIL,ZESTRIL  Take 1 tablet (5 mg total) by mouth daily.     naproxen 500 MG tablet  Commonly known as:  NAPROSYN  Take 1 tablet (500 mg total) by mouth 2 (two) times daily with meals.     ondansetron 4 MG disintegrating tablet  Commonly known as:  ZOFRAN ODT  1-2 every 6 hrs as needed     vitamin D (ergocalciferol) 50000 UNIT Caps  Commonly known as:  DRISDOL  Take 1 capsule (50,000 Units total) by mouth once a week.           Where to Get Your Medications      These medications were sent to [id number] - [place], [place] - [address]  [address] [place] [address]    Phone:  [phone]    cephalexin 500 MG capsule   ciprofloxacin 500  MG tablet   gabapentin 300 MG capsule             Hospital Course   Presentation History / Hospital Course (0 Days):  This is a 37 yo female was admitted to the hospital because of Hypertensive urgency and possible UTI  Patient HTN controlled   blood and urine cultures was negative   History of Diabetes mellitus  Patient will be discharge home to follow with PCP as an outpatient       CODE STATUS: Full Code      RECOMMENDATIONS:    - Patient was informed of abnormal and incidental imaging findings during hospitalization, and advised to review this information with their  medical provider.             Best Practices   Was the patient admitted with either a CHF Exacerbation or Pneumonia? NO     Progress Note/Physical Exam at Discharge     Subjective: Patient reported feeling well, and is ready for discharge.    Vitals:    02/03/18 0439 02/03/18 0743 02/03/18 0911 02/03/18 1113   BP: 112/66 121/76 118/76 103/67   Pulse: 83 82  88   Resp: 16 16  16    Temp: (!) 96 F (35.6 C) (!) 96.3 F (35.7 C)  97.2 F (36.2 C)   TempSrc: Oral Oral  Oral   SpO2: 99% 99%  94%   Weight: 61.8 kg (136 lb 4.8 oz)      Height:            General: NAD  HEENT: sclera anicteric, OP: Clear, MMM  Cardiovascular: RRR, no m/r/g  Lungs: CTAB, no w/r/r  Abdomen: soft, +BS, NT/ND, no masses, no g/r  Extremities: Warm and well perfused  Skin: no rashes or lesions noted on exposed surfaces  Neuro: Answers questions appropriately, responds to commands       Diagnostics     Labs/Studies Pending at Discharge:    Last Labs     Recent Labs  Lab 02/03/18  1006 02/02/18  0415 02/01/18  2322   WBC 7.68 8.99 8.70   RBC 4.21 3.99 4.88   Hgb 10.2* 9.6* 11.8   Hematocrit 32.5* 30.7* 37.4   MCV 77.2* 76.9* 76.6*   Platelets 344 355* 394*         Recent Labs  Lab 02/03/18  1006 02/02/18  0415 02/01/18  2322   Sodium 135* 135* 133*   Potassium 4.4 3.5 4.0   Chloride 105 109 103   CO2 20* 18* 20*   BUN 13.0 11.0 13.0   Creatinine 0.7 0.6 0.7   Glucose 311* 215* 281*   Calcium 8.7 8.0* 9.2       Microbiology Results     Procedure Component Value Units Date/Time    Rapid influenza A/B antigens [[id number]] Collected:  02/01/18 2138    Specimen:  Nasopharyngeal from Nasal Aspirate Updated:  02/01/18 2207    Narrative:       ORDER#: [id number]                                    ORDERED BY: Shawnie Pons, MUR  SOURCE: Nasal Aspirate                               COLLECTED:  02/01/18 21:38  ANTIBIOTICS AT COLL.:  RECEIVED :  02/01/18 21:41  Influenza Rapid Antigen A&B                FINAL       02/01/18 22:07  02/01/18   Negative for Influenza A and B             Reference Range: Negative      Urine culture [[id number]] Collected:  02/01/18 2319    Specimen:  Urine from Urine, Clean Catch Updated:  02/03/18 0432    Narrative:       Replace urinary catheter prior to obtaining the urine culture  if it has been in place for greater than or equal to 14  days:->N/A No Foley  Indications for Urine Culture:->Suprapubic Pain/Tenderness or  Dysuria  ORDER#: [id number]                                    ORDERED BY: Shawnie Pons, MUR  SOURCE: Urine, Clean Catch                            COLLECTED:  02/01/18 23:19  ANTIBIOTICS AT COLL.:                                RECEIVED :  02/02/18 05:07  Culture Urine                              PRELIM      02/03/18 04:32   +  02/03/18   >100,000 CFU/ML Gram negative rod               Further workup to follow including susceptibility testing               Patient Instructions   Discharge Diet: Heart healthy  Discharge Activity: As tolerated  LABS/TESTING recommended after discharge    Follow Up Appointment:  Follow-up Information     [facility] Emergency Department.    Specialty:  Emergency Medicine  Why:  As needed, If symptoms worsen  Contact information:  [address]  [place] [address]  [phone]           [facility]. Schedule an appointment as soon as possible for a visit today.    Why:  for re-evaluation  Contact information:  [address]  [address]  [place] [address]  [phone]           Christa See, MD .                  Time spent examining patient, discussing with patient/family regarding hospital course, chart review, reconciling medications and discharge planning: > 35 minutes.  This patient was examined by me on 02/03/2018, the day of discharge.    Cephus Slater Shaylon Aden    4:08 PM 02/03/2018

## 2018-02-03 NOTE — Plan of Care (Signed)
Problem: Discharge Barriers  Goal: Patient will be discharged home or other facility with appropriate resources   02/03/18 1141   Goal/Interventions addressed this shift   Discharge to home or other facility with appropriate resources Provide appropriate patient education;Provide information on available health resources;Initiate discharge planning

## 2018-02-09 ENCOUNTER — Ambulatory Visit (INDEPENDENT_AMBULATORY_CARE_PROVIDER_SITE_OTHER): Payer: Self-pay | Admitting: Nurse Practitioner

## 2018-02-13 ENCOUNTER — Emergency Department: Payer: Charity

## 2018-02-13 ENCOUNTER — Emergency Department
Admission: EM | Admit: 2018-02-13 | Discharge: 2018-02-13 | Disposition: A | Discharge status: Home / Self Care | Payer: Charity | Attending: Emergency Medicine | Admitting: Emergency Medicine

## 2018-02-13 DIAGNOSIS — E119 Type 2 diabetes mellitus without complications: Secondary | ICD-10-CM | POA: Insufficient documentation

## 2018-02-13 DIAGNOSIS — N3090 Cystitis, unspecified without hematuria: Secondary | ICD-10-CM | POA: Insufficient documentation

## 2018-02-13 DIAGNOSIS — I10 Essential (primary) hypertension: Secondary | ICD-10-CM | POA: Insufficient documentation

## 2018-02-13 DIAGNOSIS — Z87440 Personal history of urinary (tract) infections: Secondary | ICD-10-CM | POA: Insufficient documentation

## 2018-02-13 DIAGNOSIS — Z794 Long term (current) use of insulin: Secondary | ICD-10-CM | POA: Insufficient documentation

## 2018-02-13 DIAGNOSIS — Z79899 Other long term (current) drug therapy: Secondary | ICD-10-CM | POA: Insufficient documentation

## 2018-02-13 LAB — CBC AND DIFFERENTIAL
Absolute NRBC: 0 10*3/uL (ref 0.00–0.00)
Basophils Absolute Automated: 0.05 10*3/uL (ref 0.00–0.08)
Basophils Automated: 0.4 %
Eosinophils Absolute Automated: 0.19 10*3/uL (ref 0.00–0.44)
Eosinophils Automated: 1.6 %
Hematocrit: 31.1 % — ABNORMAL LOW (ref 34.7–43.7)
Hgb: 10 g/dL — ABNORMAL LOW (ref 11.4–14.8)
Immature Granulocytes Absolute: 0.04 10*3/uL (ref 0.00–0.07)
Immature Granulocytes: 0.3 %
Lymphocytes Absolute Automated: 1.49 10*3/uL (ref 0.42–3.22)
Lymphocytes Automated: 12.7 %
MCH: 23.8 pg — ABNORMAL LOW (ref 25.1–33.5)
MCHC: 32.2 g/dL (ref 31.5–35.8)
MCV: 74 fL — ABNORMAL LOW (ref 78.0–96.0)
MPV: 9.7 fL (ref 8.9–12.5)
Monocytes Absolute Automated: 0.76 10*3/uL (ref 0.21–0.85)
Monocytes: 6.5 %
Neutrophils Absolute: 9.16 10*3/uL — ABNORMAL HIGH (ref 1.10–6.33)
Neutrophils: 78.5 %
Nucleated RBC: 0 /100 WBC (ref 0.0–0.0)
Platelets: 392 10*3/uL — ABNORMAL HIGH (ref 142–346)
RBC: 4.2 10*6/uL (ref 3.90–5.10)
RDW: 14 % (ref 11–15)
WBC: 11.69 10*3/uL — ABNORMAL HIGH (ref 3.10–9.50)

## 2018-02-13 LAB — I-STAT CG4 VENOUS CARTRIDGE
Lactic Acid I-Stat: 0.7 mmol/L (ref 0.2–2.0)
i-STAT Base Excess Venous: -4 mEq/L
i-STAT FIO2: 21
i-STAT HCO3 Bicarbonate Venous: 21.1 mEq/L
i-STAT O2 Saturation Venous: 82 %
i-STAT Patient Temperature: 97.2
i-STAT Total CO2 Venous: 22 mEq/L
i-STAT pCO2 Venous: 36.6
i-STAT pH Venous: 7.364
i-STAT pO2 Venous: 45

## 2018-02-13 LAB — COMPREHENSIVE METABOLIC PANEL
ALT: 23 U/L (ref 0–55)
AST (SGOT): 17 U/L (ref 5–34)
Albumin/Globulin Ratio: 1.1 (ref 0.9–2.2)
Albumin: 3.5 g/dL (ref 3.5–5.0)
Alkaline Phosphatase: 125 U/L — ABNORMAL HIGH (ref 37–106)
BUN: 11 mg/dL (ref 7.0–19.0)
Bilirubin, Total: 0.7 mg/dL (ref 0.2–1.2)
CO2: 23 mEq/L (ref 22–29)
Calcium: 8.5 mg/dL (ref 8.5–10.5)
Chloride: 101 mEq/L (ref 100–111)
Creatinine: 0.6 mg/dL (ref 0.6–1.0)
Globulin: 3.3 g/dL (ref 2.0–3.6)
Glucose: 287 mg/dL — ABNORMAL HIGH (ref 70–100)
Potassium: 3.6 mEq/L (ref 3.5–5.1)
Protein, Total: 6.8 g/dL (ref 6.0–8.3)
Sodium: 133 mEq/L — ABNORMAL LOW (ref 136–145)

## 2018-02-13 LAB — URINALYSIS REFLEX TO MICROSCOPIC EXAM - REFLEX TO CULTURE
Bilirubin, UA: NEGATIVE
Blood, UA: NEGATIVE
Glucose, UA: >=500 — AB
Ketones UA: 20 — AB
Leukocyte Esterase, UA: NEGATIVE
Nitrite, UA: POSITIVE — AB
Protein, UR: NEGATIVE
Specific Gravity UA: 1.035 (ref 1.001–1.035)
Urine pH: 6 (ref 5.0–8.0)
Urobilinogen, UA: NORMAL mg/dL

## 2018-02-13 LAB — LIPASE: Lipase: 27 U/L (ref 8–78)

## 2018-02-13 LAB — ECG 12-LEAD
Atrial Rate: 99 {beats}/min
P Axis: 55 degrees
P-R Interval: 144 ms
Q-T Interval: 394 ms
QRS Duration: 92 ms
QTC Calculation (Bezet): 505 ms
R Axis: -73 degrees
T Axis: 54 degrees
Ventricular Rate: 99 {beats}/min

## 2018-02-13 LAB — GFR: EGFR: >60

## 2018-02-13 LAB — POCT PREGNANCY TEST, URINE HCG: POCT Pregnancy HCG Test, UR: NEGATIVE

## 2018-02-13 MED ORDER — IOHEXOL 350 MG/ML IV SOLN
90.00 mL | Freq: Once | INTRAVENOUS | Status: AC | PRN
Start: 2018-02-13 — End: 2018-02-13
  Administered 2018-02-13: 10:00:00 90 mL via INTRAVENOUS

## 2018-02-13 MED ORDER — ONDANSETRON HCL 4 MG/2ML IJ SOLN
4.00 mg | Freq: Once | INTRAMUSCULAR | Status: DC
Start: 2018-02-13 — End: 2018-02-13

## 2018-02-13 MED ORDER — MORPHINE SULFATE 2 MG/ML IJ/IV SOLN (WRAP)
2.0000 mg | Freq: Once | Status: DC
Start: 2018-02-13 — End: 2018-02-13
  Filled 2018-02-13: qty 1

## 2018-02-13 MED ORDER — SULFAMETHOXAZOLE-TRIMETHOPRIM 800-160 MG PO TABS
1.00 | ORAL_TABLET | Freq: Two times a day (BID) | ORAL | 0 refills | Status: AC
Start: 2018-02-13 — End: 2018-02-23

## 2018-02-13 MED ORDER — SODIUM CHLORIDE 0.9 % IV BOLUS
1000.00 mL | Freq: Once | INTRAVENOUS | Status: AC
Start: 2018-02-13 — End: 2018-02-13
  Administered 2018-02-13: 09:00:00 1000 mL via INTRAVENOUS

## 2018-02-13 MED ORDER — SODIUM CHLORIDE 0.9 % IV BOLUS
1000.00 mL | Freq: Once | INTRAVENOUS | Status: AC
Start: 2018-02-13 — End: 2018-02-13
  Administered 2018-02-13: 11:00:00 1000 mL via INTRAVENOUS

## 2018-02-13 MED ORDER — KETOROLAC TROMETHAMINE 30 MG/ML IJ SOLN
15.00 mg | Freq: Once | INTRAMUSCULAR | Status: AC
Start: 2018-02-13 — End: 2018-02-13
  Administered 2018-02-13: 11:00:00 15 mg via INTRAVENOUS
  Filled 2018-02-13: qty 1

## 2018-02-13 MED ORDER — SODIUM CHLORIDE 0.9 % IV MBP
1.00 g | Freq: Once | INTRAVENOUS | Status: AC
Start: 2018-02-13 — End: 2018-02-13
  Administered 2018-02-13: 12:00:00 1 g via INTRAVENOUS
  Filled 2018-02-13: qty 1000

## 2018-02-13 NOTE — Discharge Instructions (Signed)
Infeccin del tracto urinario, inferior     Urinary Tract Infection, Lower     1.  Se le ha diagnosticado una infeccin del tracto urinario (ITU) inferior no complicada.    1.  You have been diagnosed with an uncomplicated lower urinary tract infection (UTI).              2.  Una ITU es una infeccin de la vejiga. Su mdico la diagnostic al hacer exmenes de su orina. Por lo general, una ITU causa ardor al orinar o una necesidad frecuente de orinar. Puede sentir ganas de orinar sin necesitar de hacerlo.    2.  A UTI is an infection in your bladder. Your doctor diagnosed it by testing your urine. UTIs usually causes burning with urination (peeing) or frequent urination. It might make you feel like you have to urinate even when you don't.              3.  La ITU generalmente se trata con antibiticos y analgsicos.   3.  UTI is usually treated with antibiotics and medicine to help with pain.             4.  Es MUY IMPORTANTE que surta su receta y tome todos los antibiticos como se indique. Si una infeccin del tracto urinario no se trata durante mucho tiempo, puede convertirse en una infeccin de rin.   4.  It is VERY IMPORTANT that you fill your prescription and take all of the antibiotics as directed. If a lower urinary tract infection goes untreated for too long, it can become a kidney infection.             5.  PARA LAS MUJERES: Para reducir el riesgo de volver a tener una ITU:   5.  FOR WOMEN: To reduce the risk of getting UTIs again:      * Siempre orine antes y despus de un encuentro sexual.    * Always urinate before and after sexual intercourse.      * Siempre lmpiese de delante hacia atrs despus de orinar o defecar. No se limpie de atrs hacia delante.     * Always wipe from front to back after urinating or having a bowel movement. Do not wipe from back to front.      * Beba bastantes lquidos. Trate de beber jugo de arndanos rojos o  azules. Estos jugos contienen una sustancia qumica que evita que la bacteria se pegue a la vejiga.    * Drink plenty of fluids. Try to drink cranberry or blueberry juice. These juices have a chemical that stops bacteria from sticking to the bladder.             6.  DEBE BUSCAR ATENCIN MDICA INMEDIATA, AQU O EN LA SALA DE EMERGENCIAS MS CERCANA, SI SE PRESENTA CUALQUIERA DE LAS SIGUIENTES SITUACIONES:   6.  YOU SHOULD SEEK MEDICAL ATTENTION IMMEDIATELY, EITHER HERE OR AT THE NEAREST EMERGENCY DEPARTMENT, IF ANY OF THE FOLLOWING OCCURS:      * Tiene fiebre (temperatura mayor de 100.4F / 38C) o escalofros.    * You have a fever (temperature higher than 100.4F / 38C) or shaking chills.      * Siente nuseas o vomita.    * You feel nauseated or vomit.      * Tiene dolor en un costado o en la espalda.    * You have pain in your side or back.      *   No mejora despus de tomar todos sus antibiticos.    * You don't get better after taking all of your antibiotics.      * Tiene nuevos sntomas o molestias.     * You have any new symptoms or concerns.      * Se siente peor o no mejora.     * You feel worse or do not improve.             7.  Si no puede dar seguimiento con su mdico, o si en cualquier momento cree que necesita una nueva revisin o ser atendido de nuevo, venga aqu o acuda a la sala de emergencias ms cercana.   7.  If you can't follow up with your doctor, or if at any time you feel you need to be rechecked or seen again, come back here or go to the nearest emergency department.                      Antibiticos      Antibiotics      1.  Alguna informacin importante que necesita saber.    1.  Some Important Information You Need To Know.              2.  Los antibiticos son medicamentos fuertes que pueden matar bacterias. Pueden salvar vidas. Evitan que las infecciones bacterianas se vuelvan ms graves. Los antibiticos no funcionan  contra infecciones causadas por virus. No reducirn las enfermedades virales. En general, las infecciones causadas por virus son mucho ms comunes que las bacterianas.    2.  Antibiotics are strong medicines that can kill bacteria. They can save lives. They keep bacterial infections from becoming more serious. Antibiotics do not work against infections that are caused by viruses. They will not make viral illnesses shorter. Overall, infections caused by viruses are much more common than those caused by bacteria.              3.  Las infecciones virales incluyen:   3.  Viral infections include:      * Bronquitis.    * Bronchitis.      * Gripes.    * Colds.      * Influenza.    * Flu (influenza).      * La mayora de los casos de tos.    * Most coughs.      * La mayora de las infecciones de odos.    * Most ear infections.      * La mayora de los dolores de garganta.    * Most sore throats.      * Gripe del estmago (gastroenteritis viral).    * Stomach flu (viral gastroenteritis).             4.  Las infecciones bacterianas incluyen:   4.  Bacterial infections include:      * Infecciones en la vejiga.    * Bladder infections.      * Infecciones en la piel y en heridas.    * Skin and wound infections.      * Infecciones en los senos nasales que duran ms de 2 semanas.    * Sinus infections that last more than 2 weeks.      * Algunas infecciones de odos.    * Some ear infections.      * Dolor de garganta (alrededor del 10% (1 de cada 10) de los dolores de garganta).    *   Strep throat (about 10% (1 in 10) of sore throats).      * Neumona (infeccin pulmonar).    * Pneumonia (lung infection).             5.  Es muy importante que limite el uso de medicamentos antibiticos para tratar solamente infecciones bacterianas. Cuando se dan antibiticos para una enfermedad viral, puede ser difcil saber si los sntomas, incluyendo el sarpullido,  son causados por el virus o una alergia a un antibitico. Esto puede causar problemas en el futuro cuando se requieran antibiticos para tratar una infeccin bacteriana.    5.  It is very important to limit the use of antibiotic medicines to treat only bacterial infections. When antibiotics are given for a viral illness, it can be hard to know if symptoms, including rash, are caused by the virus or an allergy to an antibiotic. This can cause problems in the future when antibiotics are needed to treat a bacterial infection.              6.  Usar antibiticos cuando no es necesario puede hacer que la bacteria se vuelva resistente. Esto tambin puede ocurrir [employer] antibiticos no se Metallurgist. Esto significa que los antibiticos estndar pueden no ser capaces de matar la bacteria que le est causando la enfermedad.   6.  Using antibiotics when they are not needed can cause bacteria to become resistant. It can also happen when antibiotics are not taken correctly. This means that the standard antibiotics may not be able to kill the bacteria that is making you ill.             7.  Infecciones causadas por bacterias resistentes a frmacos:   7.  Infections caused by drug-resistant bacteria:      * Son mucho ms difciles de tratar.    * Are much harder to treat.      * Se requieren ms pruebas.    * Need more tests.      * Posiblemente se requiera hospitalizacin.    * May require you to be checked into the hospital.      * Son ms costosas de tratar.    * Are more expensive to treat.      * Tienen mayores posibilidades de causar complicaciones, incluyendo la [place].    * Have a higher chance of complications including death.             8.  Los antibiticos tambin pueden tener efectos secundarios. Los efectos secundarios ms comunes son nuseas (sentirse mal del Teaching laboratory technician), vmitos (devolver el [place]), diarrea y sarpullido.   8.  Antibiotics can also  have side effects. The most common side effects are nausea (sick to the stomach), vomiting (throwing up), diarrhea, and rashes.                      Gracias por elegir Financial planner (Emergency Department) de [facility], el primero en el rea de [place].  Espero que su consulta de hoy haya sido [place].     Thank you for choosing the [facility] Emergency Department, the premier emergency department in the [place] area.  I hope your visit today was EXCELLENT.               Instrucciones especficas para su consulta de hoy:     Specific instructions for your visit today:  Si USTED no SIGUE MEJORANDO o si su condicin empeora, comunquese con su mdico o acuda de inmediato al departamento de emergencias.       If you do not continue to improve or your condition worsens, please contact your doctor or return immediately to the Emergency Department.             Weston Settle, MD  Mdico especialista en emergencias  [place] de emergencias de [facility]       Sincerely,  Izola Price, Jocelyn Lamer, MD  Attending Emergency Physician  [facility] Emergency Department               [facility] EN [facility]  [place] servicio de farmacia en el hospital se encuentra en el rea de espera de la sala de emergencias.  Abre los 7 das de la semana de 9 am a 9 pm.  Trabajamos con los seguros mdicos ms importantes y nuestros precios son competitivos en comparacin con los de otros proveedores.  Pida a su proveedor que imprima su prescripcin para la farmacia, de modo que pueda llegar a casa ms rpido.     ONSITE PHARMACY  Our full service onsite pharmacy is located in the ER waiting room.  Open 7 days a week from 9 am to 9 pm.  We accept all major insurances and prices are competitive with major retailers.  Ask your provider to print your prescriptions down to the pharmacy to speed you on your way home.             CMO  OBTENER UNA CITA PARA RECIBIR ATENCIN MDICA PRIMARIA    Los mdicos de atencin primaria (PCP, por sus siglas en ingls) son internistas o mdicos de cabecera. Ambos tipos de PCP se enfocan en el fomento de [employer], la prevencin de [place], la educacin y la asesora del paciente y el tratamiento de condiciones mdicas agudas y crnicas.    Llame para concertar una cita con un mdico de atencin primaria.  Pregunte qu mdico est recibiendo [employer].     [facility]  [place]: [phone]  [email or web address]     OBTAINING A PRIMARY CARE APPOINTMENT    Primary care physicians (PCPs, also known as primary care doctors) are either internists or family medicine doctors. Both types of PCPs focus on health promotion, disease prevention, patient education and counseling, and treatment of acute and chronic medical conditions.    Call for an appointment with a primary care doctor.  Ask to see who is taking new patients.       [facility]  telephone:  [phone]  [email or web address]             REMISIONES A MDICOS  Llame al [phone] (disponible las 24 horas al da, los 7 das de la semana) si necesita una remisin y podremos ayudarlo a Clinical research associate un mdico de atencin primaria o Music therapist.  Tambin est disponible en lnea en la pgina web: [email or web address]     DOCTOR REFERRALS  Call [phone] (available 24 hours a day, 7 days a week) if you need any further referrals and we can help you find a primary care doctor or specialist.  Also, available online at:  [email or web address]             INFORMACIN DE CONTACTO  Antes de marcharse, verifique en el registro que su nmero de contacto est actualizado.  Puede llamar al [place] al [phone] para actualizar su informacin.  Si tiene preguntas sobre la factura del hospital, llame al [phone].  Si tiene preguntas sobre la factura del mdico del departamento de emergencias,  llame al [phone].       YOUR CONTACT INFORMATION  Before leaving please check with registration to make sure we have an up-to-date contact number.  You can call registration at [phone] to update your information.  For questions about your hospital bill, please call [phone].  For questions about your Emergency Dept Physician bill please call [phone].               SERVICIOS MDICOS GRATUITOS  Si necesita ayuda con servicios mdicos o sociales, llame al 2-1-1 para recibir informacin [employer] recursos en su rea.  2-1-1 es un servicio gratuito que brinda a las personas informacin sobre seguros mdicos, clnicas gratuitas, Psychiatrist, salud mental, atencin dental, apoyo alimentario, vivienda y asesora para tratar la drogadiccin.  Tambin est disponible en lnea en la pgina web: http://www.RankInsider.nl.     FREE HEALTH SERVICES  If you need help with health or social services, please call 2-1-1 for a free referral to resources in your area.  2-1-1 is a free service connecting people with information on health insurance, free clinics, pregnancy, mental health, dental care, food assistance, housing, and substance abuse counseling.  Also, available online at:  http://www.211[place].org             EXPEDIENTES MDICOS Y PRUEBAS  Los resultados de [address] pruebas de laboratorio no se reciben el mismo da, [employer] del cultivo de [place].   Nos comunicaremos con usted si observamos otros hallazgos de importancia.  Las placas de radiologa se suelen revisar dos veces para garantizar la exactitud.  Si existe alguna discrepancia, se lo notificaremos.      Llame al [phone] para recoger un disco compacto (CD) de obsequio con los estudios radiolgicos que se le hicieron.  Si usted o su mdico desean solicitar una copia de su expediente mdico, llame al [phone].       MEDICAL RECORDS AND TESTS  Certain laboratory test results do not come back the same day, for example urine cultures.    We will contact you if other important findings are noted.  Radiology films are often reviewed again to ensure accuracy.  If there is any discrepancy, we will notify you.      Please call [phone] to pick up a complimentary CD of any radiology studies performed.  If you or your doctor would like to request a copy of your medical records, please call [phone].             LESIONES ORTOPDICAS   Tenga en cuenta que puede haber lesiones considerables incluso en casos en los que una radiografa inicial arroje resultados normales o negativos.  Esto puede deberse a que algunas fracturas (huesos rotos) no son visibles inicialmente en las radiografas.  Por este motivo, es necesario que el mdico de atencin primaria o un Glass blower/designer en huesos (ortopeda o Barista) le hagan un seguimiento ambulatorio detallado.     ORTHOPEDIC INJURY   Please know that significant injuries can exist even when an initial x-ray is read as normal or negative.  This can occur because some fractures (broken bones) are not initially visible on x-rays.  For this reason, close outpatient follow-up with your primary care doctor or bone specialist (orthopedist) is required.             MEDICAMENTOS Y SEGUIMIENTO  Tenga en cuenta que algunos medicamentos  prescritos pueden producir somnolencia.  Sea cuidadoso al [employer] u operar maquinaria mientras toma estos medicamentos.    Las evaluaciones y los tratamientos que recibi en nuestro departamento de emergencias se proveen en caso de emergencia y no tienen el propsito de Microbiologist a su mdico de atencin primaria.  Es importante que su mdico lo examine de nuevo y que usted le notifique cualquier problema nuevo o que [place].       MEDICATIONS AND FOLLOWUP  Please be aware that some prescription medications can cause drowsiness.  Use caution when driving or operating machinery.    The examination and treatment you have received in our Emergency Department is provided on an emergency  basis, and is not intended to be a substitute for your primary care physician.  It is important that your doctor checks you again and that you report any new or remaining problems at that time.               FARMACIAS DE 24 HORAS AL DA  La farmacia de 24 horas al da ms cercana es:    [facility]  [address]  [place], [place] [address]  [phone]     24 HOUR PHARMACIES  The nearest 24 hour pharmacy is:    [facility]  [address]  [place], [place] [address]  [phone]             ASISTENCIA CON EL SEGURO    [place] de Atencin de Salud Asequible (ACA, por sus siglas en ingls)  Llame para comenzar o terminar una solicitud, comparar planes, inscribirse o hacer preguntas.  [phone]  TTY: [phone]  Pgina web: Healthcare.gov    Ayuda para inscribirse en Medicaid  Cover [place]  [phone] (LNEA GRATUITA)  [phone] (TTY)  Pgina web: http://www.coverva.org    Ayuda local para inscribirse en la ACA  [place] [facility] (NVFS)  [phone] (CENTRAL TELEFNICA)  Correo electrnico: health-help@nvfs .org  Pgina web: [place].com.ee  Direccin: [address], [address] [place], [place] [address]     ASSISTANCE WITH INSURANCE    Affordable Care Act  (ACA)  Call to start or finish an application, compare plans, enroll or ask a question.  [phone]  TTY: [phone]  Web:  Healthcare.gov    Help Enrolling in [facility]  Cover [place]  [phone] (TOLL-FREE)  [phone] (TTY)  Web:  Http://www.coverva.org    Local Help Enrolling in the [facility]  [place] [facility]  [phone] (MAIN)  Email:  health-help@nvfs .org  Web:  BlackjackMyths.is  Address:  [address] [place], [place] [address]             MEDICAMENTOS SEDANTES  Los medicamentos sedantes abarcan medicamentos fuertes para Chief Technology Officer (p. ej., narcticos), relajantes musculares, benzodiacepinas (se usan para combatir la ansiedad y como  relajante muscular), Benadryl o difenhidramina y dems antihistamnicos que se usan para combatir las Therapist, art o la comezn y otros medicamentos.  Si no est seguro si se le suministr un medicamento sedante, pregunte a su mdico o enfermero(a).  Si se le suministr un medicamento sedante: NO conduzca un automvil. NO opere maquinaria. NO realice trabajos en los que deba estar alerta.  NO consuma bebidas alcohlicas mientras tome este medicamento.   SEDATING MEDICATIONS  Sedating medications include strong pain medications (e.g. narcotics), muscle relaxers, benzodiazepines (used for anxiety and as muscle relaxers), Benadryl/diphenhydramine and other antihistamines for allergic reactions/itching, and other medications.  If  you are unsure if you have received a sedating medication, please ask your physician or nurse.  If you received a sedating medication: DO NOT drive a car. DO NOT operate machinery. DO NOT perform jobs where you need to be alert.  DO NOT drink alcoholic beverages while taking this medicine.               Si se siente mareado, sintese o recustese al observar los primeros sntomas. [place] y baje las escaleras con cuidado.  Tenga extremo cuidado para evitar cadas.   If you get dizzy, sit or lie down at the first signs. Be careful going up and down stairs.  Be extra careful to prevent falls.             Nunca d este medicamento a Economist.   Never give this medicine to others.             Mantenga este medicamento fuera del alcance de los nios.   Keep this medicine out of reach of children.             No tome o guarde medicamentos viejos. Deschelos cuando se venzan.     Guarde los [employer] en un lugar seco y fresco. NO los guarde en el gabinete de medicinas del bao o en un gabinete que se encuentre encima de la estufa.   Do not take or save old medicines. Throw them away when outdated.     Keep all medicines in a cool, dry place. DO NOT keep them in your bathroom medicine  cabinet or in a cabinet above the stove.             RESURTIDO DE MEDICAMENTOS  Tenga en cuenta que no podemos resurtir medicamentos prescritos en la sala de emergencias. Si necesita un tratamiento adicional al que se Radiographer, therapeutic en la sala de Sports administrator, consulte a su mdico de atencin primaria o al especialista en el tratamiento del dolor.   MEDICATION REFILLS  Please be aware that we cannot refill any prescriptions through the ER. If you need further treatment from what is provided at your ER visit, please follow up with your primary care doctor or your pain management specialist.             DEPARTAMENTOS INDEPENDIENTES DE EMERGENCIAS DE [place] [facility]  [place] que [place] cuenta con dos salas de emergencias independientes a unas cuantas millas?  En la sala de emergencias de [employer] en [place] y en la sala de emergencias en [place] el tiempo de espera es corto, el estacionamiento es gratis en frente del edificio y reciben altas calificaciones de satisfaccin por parte de los [place]. Igualmente, estas cuentan con los mismos mdicos de emergencias certificados de [facility].     FREESTANDING EMERGENCY DEPARTMENTS OF [facility]  Did you know [place] has two freestanding ERs located just a few miles away?  [facility] [place] and [facility] of [place] have short wait times, easy free parking directly in front of the building and top patient satisfaction scores - and the same Board Certified Emergency Medicine doctors as [facility].

## 2018-02-13 NOTE — ED Provider Notes (Signed)
Physician/Midlevel provider first contact with patient: 02/13/18 0818       Date Time: 02/13/18 8:52 AM   Patient Name: Kelsey Miles   Attending Physician: Dr. Collene Mares, M.D.    Attending Note:   I have reviewed and agree with the history. The pertinent physical exam has been documented.  Selected historical findings: 36yoF with hx DM, HTN, recent UTI BIBA p/w 4 days of worsening bilateral flank pain. Associated with urinary frequency and subjective fever. Today at 0100 she developed nausea, vomiting and epigastric pain. No dysuria currently. She was admitted at an OSH from 4/24 to 4/26 for hypertensive urgency and UTI. Flank pain worsened after finishing her abx for recent UTI. She was given 4 mg Zofran en route and NS.     Selected physical examination findings: AF VSS Nontoxic.  Abd soft, minimal B flank discomfort w/ palpation.    Assessment: Consider pyelonephritis.  Plan: Will cover w/ abx.  Tolerating PO and pain control.  D/c home w/ close PCP + ID follow up.      EKG -             interpreted by me: NSR 99, no ST segment changes, QTC 505, abnormal EKG  ____________________________________________________________________    I was acting as a scribe for Collene Mares, M.D. on Russ Halo M   Treatment Team: Scribe: Despina Hidden     I am the first provider for this patient and I personally performed the services documented. Treatment Team: Scribe: Despina Hidden is scribing for me on TOTO AMBROS,Oval M. This note accurately reflects work and decisions made by me.  Collene Mares, M.D.  ____________________________________________________________________          Olevia Bowens, MD  02/17/18 [phone]

## 2018-02-13 NOTE — ED Provider Notes (Signed)
[facility] EMERGENCY DEPARTMENT RESIDENT H&P       CLINICAL INFORMATION        HPI:      Chief Complaint: Flank Pain  .    [facility] Kelsey Miles is a 37 y.o. female who presents with 4 days of progressive bilateral flank pain. She was recently hospitalized for UTI and discharged home on ciprofloxacin and keflex course. While on abx, her symptoms were minimal but after completing course she developed worsening flank pain with subjective fevers. Flank pain radiates to her groin. She denies any dysuria but does endorse urinary frequency. No hematuria. This AM at 0100 she developed acute onset epigastric pain with nonbloody, nonbiliary emesis, prompting her to present for evaluation. She has had loose stools for 2 days but denies hematochezia or melena. LMP 7 days ago.     History obtained from: patient, review of prior chart    Patient is spanish-speaking, in-person interpreter used at bedside.        Nursing (triage) note reviewed for the following pertinent information:  BIBA - PAtient was diagnosed 15 days ago with a UTI. Yesterday patient started to feel bilateral flank pain and epigastric pain. Patient's last known menstrual cycle was 7 days ago. En route EMS gave 4mg  zofran and 250 cc NS. Upon arrival patient is A&Ox4.       ROS:      Review of Systems   Constitutional: Positive for fever. Negative for chills.   Respiratory: Positive for chest tightness. Negative for shortness of breath and wheezing.    Cardiovascular: Negative for chest pain and palpitations.   Gastrointestinal: Positive for abdominal pain, diarrhea, nausea and vomiting. Negative for blood in stool and constipation.   Genitourinary: Positive for flank pain and frequency. Negative for dysuria and hematuria.   Musculoskeletal: Positive for back pain.   Neurological: Positive for light-headedness, numbness and headaches.         Physical Exam:      Pulse (!) 104  BP 120/74  Resp 16  SpO2 100 %  Temp      Physical Exam    Constitutional: She is oriented to person, place, and time. She appears well-developed and well-nourished. No distress.   HENT:   Head: Normocephalic and atraumatic.   Eyes: Conjunctivae and EOM are normal.   Neck: Normal range of motion.   Cardiovascular: Normal rate, regular rhythm, normal heart sounds and intact distal pulses.    No murmur heard.  Pulmonary/Chest: Effort normal and breath sounds normal. No respiratory distress. She has no wheezes. She has no rales.   Abdominal: Soft. Bowel sounds are normal. She exhibits no distension and no mass. There is tenderness. There is CVA tenderness. There is no rebound and no guarding.   Mild epigastric tenderness. L sided CVA tenderness   Musculoskeletal: Normal range of motion.   Neurological: She is alert and oriented to person, place, and time.   Skin: Skin is warm and dry. She is not diaphoretic.               PAST HISTORY        Primary Care Provider: Pcp, None, MD        PMH/PSH:    .     Past Medical History:   Diagnosis Date   . Diabetes    . Diabetes mellitus associated with hormonal etiology 2007   . Gout    . Hypertension    . UTI (urinary tract infection)  She has a past surgical history that includes Cesarean section (x2) and Cesarean section.      Social/Family History:      She reports that she has never smoked. She has never used smokeless tobacco. She reports that she does not drink alcohol or use drugs.    Family History   Problem Relation Age of Onset   . Diabetes Father    . Diabetes Brother    . Diabetes Sister          Listed Medications on Arrival:    .     Home Medications     Med List Status:  In Progress Set By: Trellis Paganini, RN at 02/13/2018  8:33 AM                gabapentin (NEURONTIN) 300 MG capsule     Take 1 capsule (300 mg total) by mouth every 8 (eight) hours     insulin lispro protamine-insulin lispro (HUMALOG 75-25) (75-25) 100 UNIT/ML SUSP     Take 10 units before breakfast  & 10 units before dinner.     insulin NPH  (HUMULIN,NOVOLIN) 100 UNIT/ML injection     Inject 15 Unit into the skin 2 (two) times daily before meals, in the morning and evening.     Insulin Syringe-Needle U-100 (INSULIN SYRINGE .5CC/31GX5/16") 31G X 5/16" 0.5 ML Misc     Insulin needle     lisinopril (PRINIVIL,ZESTRIL) 5 MG tablet     Take 1 tablet (5 mg total) by mouth daily.     metFORMIN (GLUCOPHAGE) 1000 MG tablet     Take 1 tablet (1,000 mg total) by mouth 2 (two) times daily with meals.     naproxen (NAPROSYN) 500 MG tablet     Take 1 tablet (500 mg total) by mouth 2 (two) times daily with meals.     ondansetron (ZOFRAN ODT) 4 MG disintegrating tablet     1-2 every 6 hrs as needed     Vitamin D, Ergocalciferol, (DRISDOL) 50000 UNIT Cap     Take 1 capsule (50,000 Units total) by mouth once a week.         Allergies: She has No Known Allergies.            VISIT INFORMATION        Reassessments/Clinical Course:      1100: CT without evidence of pyelo but urine with elevated WBC and nitrite positive. Patient complaining of 10/10 pain. Will try toradol.       Conversations with Other Providers:              Medications Given in the ED:    .     ED Medication Orders     Start Ordered     Status Ordering Provider    02/13/18 [phone] 02/13/18 0822  sodium chloride 0.9 % bolus 1,000 mL  Once     Route: Intravenous  Ordered Dose: 1,000 mL     Acknowledged Solomon Skowronek J            Procedures:      Procedures      Assessment/Plan:      2F with DM2 presents with progressive flank pain and malaise following completion of abx for UTI. 1 episode of n/v this AM. UA shows elevated WBC and nitrite positive, concerning for recurrent infection vs incompletely treated. No e/o pyelo on CT abdomen. Blood and urine ultures pending but prior urine culture data shows pan-sensitive E. Coli.  Will trial 10d course bactrim and have patient follow up with transition care clinic. Referral also placed for ID clinic. Return precautions discussed, patient safe to discharge.                 Dewain Penning, MD  Resident  02/13/18 1225

## 2018-02-13 NOTE — ED Notes (Signed)
Bed: N 38  Expected date:   Expected time:   Means of arrival: [id number] [place]  Comments:  [facility] 410

## 2018-05-21 ENCOUNTER — Emergency Department
Admission: EM | Admit: 2018-05-21 | Discharge: 2018-05-21 | Disposition: A | Discharge status: Home / Self Care | Payer: Charity | Attending: Emergency Medicine | Admitting: Emergency Medicine

## 2018-05-21 DIAGNOSIS — E1165 Type 2 diabetes mellitus with hyperglycemia: Secondary | ICD-10-CM

## 2018-05-21 DIAGNOSIS — E11621 Type 2 diabetes mellitus with foot ulcer: Secondary | ICD-10-CM | POA: Insufficient documentation

## 2018-05-21 DIAGNOSIS — I10 Essential (primary) hypertension: Secondary | ICD-10-CM | POA: Insufficient documentation

## 2018-05-21 DIAGNOSIS — Z79899 Other long term (current) drug therapy: Secondary | ICD-10-CM | POA: Insufficient documentation

## 2018-05-21 DIAGNOSIS — L97519 Non-pressure chronic ulcer of other part of right foot with unspecified severity: Secondary | ICD-10-CM | POA: Insufficient documentation

## 2018-05-21 DIAGNOSIS — Z794 Long term (current) use of insulin: Secondary | ICD-10-CM | POA: Insufficient documentation

## 2018-05-21 LAB — CBC AND DIFFERENTIAL
Absolute NRBC: 0 10*3/uL (ref 0.00–0.00)
Basophils Absolute Automated: 0.06 10*3/uL (ref 0.00–0.08)
Basophils Automated: 0.7 %
Eosinophils Absolute Automated: 0.29 10*3/uL (ref 0.00–0.44)
Eosinophils Automated: 3.6 %
Hematocrit: 33.6 % — ABNORMAL LOW (ref 34.7–43.7)
Hgb: 10.7 g/dL — ABNORMAL LOW (ref 11.4–14.8)
Immature Granulocytes Absolute: 0.03 10*3/uL (ref 0.00–0.07)
Immature Granulocytes: 0.4 %
Lymphocytes Absolute Automated: 3.3 10*3/uL — ABNORMAL HIGH (ref 0.42–3.22)
Lymphocytes Automated: 40.5 %
MCH: 23.5 pg — ABNORMAL LOW (ref 25.1–33.5)
MCHC: 31.8 g/dL (ref 31.5–35.8)
MCV: 73.7 fL — ABNORMAL LOW (ref 78.0–96.0)
MPV: 9.4 fL (ref 8.9–12.5)
Monocytes Absolute Automated: 0.74 10*3/uL (ref 0.21–0.85)
Monocytes: 9.1 %
Neutrophils Absolute: 3.73 10*3/uL (ref 1.10–6.33)
Neutrophils: 45.7 %
Nucleated RBC: 0 /100 WBC (ref 0.0–0.0)
Platelets: 422 10*3/uL — ABNORMAL HIGH (ref 142–346)
RBC: 4.56 10*6/uL (ref 3.90–5.10)
RDW: 17 % — ABNORMAL HIGH (ref 11–15)
WBC: 8.15 10*3/uL (ref 3.10–9.50)

## 2018-05-21 LAB — BASIC METABOLIC PANEL
BUN: 11 mg/dL (ref 7.0–19.0)
CO2: 23 mEq/L (ref 22–29)
Calcium: 10.1 mg/dL (ref 8.5–10.5)
Chloride: 95 mEq/L — ABNORMAL LOW (ref 100–111)
Creatinine: 0.9 mg/dL (ref 0.6–1.0)
Glucose: 484 mg/dL — ABNORMAL HIGH (ref 70–100)
Potassium: 4.9 mEq/L (ref 3.5–5.1)
Sodium: 130 mEq/L — ABNORMAL LOW (ref 136–145)

## 2018-05-21 LAB — GLUCOSE WHOLE BLOOD - POCT: Whole Blood Glucose POCT: 331 mg/dL — ABNORMAL HIGH (ref 70–100)

## 2018-05-21 LAB — GFR: EGFR: >60

## 2018-05-21 MED ORDER — METFORMIN HCL 500 MG PO TABS
1000.00 mg | ORAL_TABLET | Freq: Two times a day (BID) | ORAL | 0 refills | Status: DC
Start: 2018-05-21 — End: 2018-05-30

## 2018-05-21 MED ORDER — SODIUM CHLORIDE 0.9 % IV BOLUS
1000.00 mL | Freq: Once | INTRAVENOUS | Status: AC
Start: 2018-05-21 — End: 2018-05-21
  Administered 2018-05-21: 04:00:00 1000 mL via INTRAVENOUS

## 2018-05-21 MED ORDER — INSULIN REGULAR HUMAN 100 UNIT/ML IJ SOLN
5.00 [IU] | Freq: Once | INTRAMUSCULAR | Status: AC
Start: 2018-05-21 — End: 2018-05-21
  Administered 2018-05-21: 05:00:00 5 [IU] via INTRAVENOUS
  Filled 2018-05-21: qty 3

## 2018-05-21 NOTE — Discharge Instructions (Signed)
lcera del pie diabtico        Diabetic Foot Ulcer      1.   Usted ha sido diagnosticado con una lcera del pie diabtico.    1.   You have been diagnosed with a diabetic foot ulcer.                    2.   Este es un problema comn. Afecta aproximadamente a un 15% de los pacientes con diabetes (15 de 100). Los sntomas incluyen una lcera (una llaga) en el pie. Puede tener enrojecimiento e hinchazn. Algunas lceras pueden presentar secrecin de lquido o pus. Por lo general, no hay dolor. Esto se debe a la neuropata (prdida de sensacin o disminucin de la sensibilidad).    2.   This is a common problem. It affects about 15% of patients with diabetes (15 out of 100). Symptoms include an ulcer (a sore) on the foot. It can have redness and swelling. Some ulcers also drain fluid or pus. Often, there is no pain. This is because of neuropathy (loss of sensation or decreased feeling).                    3.   Existen diferentes tratamientos para la lcera del pie diabtico. Depende de la gravedad de sus sntomas. La mayora de las lceras pueden ser tratadas con desbridamiento (remocin de tejido muerto), antibiticos tpicos (para aplicar en la superficie) y vendajes limpios. Tambin es MUY IMPORTANTE que eleve el pie (mantenerlo por encima del suelo) y no aplique nada de presin en el rea. En algunos casos se utilizan antibiticos.    3.   There are different diabetic foot ulcer treatments. It depends on how serious the symptoms are. Most ulcers can be treated with debridement (removing of dead tissue), topical (surface) antibiotics and clean bandages. It is also VERY IMPORTANT to elevate the foot (keep it off the ground) and take all pressure off the area. Antibiotics are used in some cases.                    4.   La lcera del pie diabtico es un problema serio. Puede ser difcil de tratar. Es importante que le d seguimiento en unos cuantos das. Esto puede realizarse con su mdico de atencin primaria, su  mdico de referencia o centro de cuidado de heridas.    4.   Diabetic foot ulcer is a serious problem. It can be hard to treat. It is important for you to have a follow-up within a few days. This can be with your primary doctor, referral doctor or wound care center.                    5.   Puede tener neuropata diabtica. Esta es la prdida de sensacin o sensibilidad disminuida en su pie. De ser as, no podr sentir dolor o incomodidad a medida que la lcera empeore. Es MUY IMPORTANTE que vigile muy de cerca la lcera. Si no puede hacerlo, pdale a un familiar o amigo que mantenga un registro del tamao y profundidad de la herida. Esta persona necesita vigilar por si se presentan signos de infeccin. Algunos signos son: enrojecimiento, pus, ms dolor, hinchazn o fiebre (temperatura mayor de 100.4F/38C).    5.   You may have diabetic neuropathy. This is loss of feeling or decreased feeling in your feet. If so, you may not feel pain or discomfort if the   ulcer gets worse. It is VERY IMPORTANT to watch the ulcer very closely. If you can't do this, have a family member or friend keep track of the size and depth of the wound. This person needs to watch for signs of infection. Some signs are: redness, pus drainage, more pain, swelling or fever (temperature higher than 100.4F / 38C).                    6.   DEBE BUSCAR ATENCIN MDICA INMEDIATAMENTE, AQU O EN LA SALA DE EMERGENCIAS MS CERCANA, SI SE PRESENTA CUALQUIERA DE LAS SIGUIENTES SITUACIONES:    6.   YOU SHOULD SEEK MEDICAL ATTENTION IMMEDIATELY, EITHER HERE OR AT THE NEAREST EMERGENCY DEPARTMENT, IF ANY OF THE FOLLOWING OCCURS:        * Si presenta ms enrojecimiento, hinchazn, secrecin o dolor en el sitio de la lcera.      * More redness, swelling, discharge, or pain where the ulcer is.        * Si tiene fiebre (temperatura mayor de 100.4F/38C).      * Fever (temperature higher than 100.4F / 38C).        * Si tiene mareos, debilidad o fatiga  (sentirse cansado).      * Lightheadedness, weakness or fatigue (feeling tired).        * Si tiene confusin o problemas para pensar o hablar.      * Confusion or problems thinking or speaking.        * Si tiene problemas para controlar sus niveles de azcar en la sangre.      * Trouble controlling your blood sugars.        * Si tiene algn otro sntoma, inquietudes, o no mejora como esperaba.      * Any other symptoms, concerns, or failure to get better as expected.

## 2018-05-21 NOTE — ED Provider Notes (Signed)
Physician/Midlevel provider first contact with patient: 05/21/18 0359          [facility] EMERGENCY DEPARTMENT H&P      Visit date: 05/21/2018      CLINICAL SUMMARY          Diagnosis:    .     Final diagnoses:   Ulcer of right foot, unspecified ulcer stage   Poorly controlled diabetes mellitus         MDM Notes:      I am the first provider for this patient.  I reviewed the vital signs, nursing notes, past medical history, past surgical history, family history and social history.  I have reviewed the patient's previous charts.    Reassessment:   Re-evaluated patient, shared results, answered patient questions, reviewed disposition plan.  A stabilizing H&P was performed.Pt. stable with treatment offered in the department and an emergency medical evaluation did not reveal any immediate life or limb threats. Disposition and f/u options were outlined. They were instructed to follow up with Transition clinic in 2 days, Podiatry ASAP, be compliant with meds and return to the ER if they developed any new symptoms or if they had any other concerns.  The precautions of fever, vomiting or worsening pain were emphasized. Discussed with and patient aware of diagnostic uncertainty. They verbalized understanding of the plan of care and felt comfortable with the plan.           Disposition:         Discharge         Discharge Prescriptions     Medication Sig Dispense Auth. Provider    metFORMIN (GLUCOPHAGE) 500 MG tablet Take 2 tablets (1,000 mg total) by mouth 2 (two) times daily with meals for 15 days 30 tablet Arlana Lindau, MD                      CLINICAL INFORMATION        HPI:      Chief Complaint: Wound Infection  .    [facility] Liam [name] Gillermo Murdoch is a 37 y.o. female who presents with 9 days of gradually worsening right foot pain and great toe wound.    PMH DM for which she regularly takes metformin with questionable compliance.     She is not regularly followed by a PMD or podiatrist, cites that she does not have insurance.      Denies abdominal pain, CP/SOB, fevers, paresthesias, or recent illness.     History obtained from: Patient  Care in patient's language - provider is fluent.          ROS:      Positive and Negative ROS elements as per HPI  All Other Systems Reviewed and Negative: Yes      Physical Exam:      Pulse (!) 114  BP (!) 147/98  Resp 19  SpO2 100 %  Temp 97.5 F (36.4 C)    Physical Exam   Nursing note and vitals reviewed.  Constitutional:Pt is oriented to person, place, and time. Pt appears well-developed and well-nourished. No distress.   HEENT:   Head: Normocephalic and atraumatic.   Right Ear: External ear normal.   Left Ear: External ear normal.   Nose: Nose normal.   Mouth/Throat: Oropharynx is clear and moist. No oropharyngeal exudate.   Eyes: Conjunctivae and EOM are normal. Pupils are equal, round, and reactive to light. Right eye exhibits no discharge. Left eye exhibits no discharge.  No scleral icterus.   Neck: Normal range of motion. Neck supple. No JVD present. No tracheal deviation present. No thyromegaly present.   Cardiovascular: Normal rate, regular rhythm, normal heart sounds and intact distal pulses.  Exam reveals no gallop and no friction rub.    No murmur heard.  Pulmonary/Chest: Effort normal and breath sounds normal. No stridor. No respiratory distress. Pt has no wheezes. Pt exhibits no tenderness.   Abdominal: There is no tenderness. Soft. Bowel sounds are normal. Pt exhibits no distension and no mass.  There is no rebound and no guarding.   Musculoskeletal:  No edema. FROM. Dry 1 cm ulcer to right great toe.   Neurological:  No cranial nerve deficit. Pt exhibits normal muscle tone. Coordination normal.   Skin: Skin is warm and dry. Pt is not diaphoretic.   Psychiatric: Pt has a normal mood and affect. Behavior is normal. Judgment and thought content normal            PAST HISTORY        Primary Care Provider: Pcp, None, MD        PMH/PSH:    .     Past Medical History:   Diagnosis Date   .  Diabetes    . Diabetes mellitus associated with hormonal etiology 2007   . Gout    . Hypertension    . UTI (urinary tract infection)        She has a past surgical history that includes Cesarean section (x2) and Cesarean section.      Social/Family History:      She reports that she has never smoked. She has never used smokeless tobacco. She reports that she does not drink alcohol or use drugs.    Family History   Problem Relation Age of Onset   . Diabetes Father    . Diabetes Brother    . Diabetes Sister          Listed Medications on Arrival:    .     Home Medications             gabapentin (NEURONTIN) 300 MG capsule     Take 1 capsule (300 mg total) by mouth every 8 (eight) hours     insulin lispro protamine-insulin lispro (HUMALOG 75-25) (75-25) 100 UNIT/ML SUSP     Take 10 units before breakfast  & 10 units before dinner.     insulin NPH (HUMULIN,NOVOLIN) 100 UNIT/ML injection     Inject 15 Unit into the skin 2 (two) times daily before meals, in the morning and evening.     Insulin Syringe-Needle U-100 (INSULIN SYRINGE .5CC/31GX5/16") 31G X 5/16" 0.5 ML Misc     Insulin needle     lisinopril (PRINIVIL,ZESTRIL) 5 MG tablet     Take 1 tablet (5 mg total) by mouth daily.     naproxen (NAPROSYN) 500 MG tablet     Take 1 tablet (500 mg total) by mouth 2 (two) times daily with meals.     ondansetron (ZOFRAN ODT) 4 MG disintegrating tablet     1-2 every 6 hrs as needed     Vitamin D, Ergocalciferol, (DRISDOL) 50000 UNIT Cap     Take 1 capsule (50,000 Units total) by mouth once a week.                   Allergies: She has No Known Allergies.            VISIT INFORMATION  Clinical Course in the ED:          3:15 AM  Initial dexi resulted at 484. Will repeat after insulin dosing.     6:27 AM  Repeat dexi is 331. Will Odell home with plan for transitional clinic follow up.       Medications Given in the ED:    .     ED Medication Orders     Start Ordered     Status Ordering Provider    05/21/18 0409 05/21/18 0408   sodium chloride 0.9 % bolus 1,000 mL  Once     Route: Intravenous  Ordered Dose: 1,000 mL     Last [facility] action:  New Bag [place]    05/21/18 0409 05/21/18 0408  insulin regular (HumuLIN R,NovoLIN R) injection 5 Units  Once     Route: Intravenous  Ordered Dose: 5 Units     Last MAR action:  Given Sheng Pritz            Procedures:      Procedures      Interpretations:      Pulse ox reviewed by me. All ordered labs and images reviewed by me.     O2 sat-           saturation: 100 %; Oxygen use: room air; Interpretation: Normal      Monitor -         interpreted by me: normal sinus at 98.            RESULTS        Lab Results:      Results     Procedure Component Value Units Date/Time    Basic Metabolic Panel [[id number]]  (Abnormal) Collected:  05/21/18 0257    Specimen:  Blood Updated:  05/21/18 0325     Glucose 484 (H) mg/dL      BUN 40.9 mg/dL      Creatinine 0.9 mg/dL      Calcium 81.1 mg/dL      Sodium 914 (L) mEq/L      Potassium 4.9 mEq/L      Chloride 95 (L) mEq/L      CO2 23 mEq/L     GFR [[id number]] Collected:  05/21/18 0257     Updated:  05/21/18 0325     EGFR >60.0    CBC with differential [[id number]]  (Abnormal) Collected:  05/21/18 0258    Specimen:  Blood from Blood Updated:  05/21/18 0312     WBC 8.15 x10 3/uL      Hgb 10.7 (L) g/dL      Hematocrit 62.9 (L) %      Platelets 422 (H) x10 3/uL      RBC 4.56 x10 6/uL      MCV 73.7 (L) fL      MCH 23.5 (L) pg      MCHC 31.8 g/dL      RDW 17 (H) %      MPV 9.4 fL      Neutrophils 45.7 %      Lymphocytes Automated 40.5 %      Monocytes 9.1 %      Eosinophils Automated 3.6 %      Basophils Automated 0.7 %      Immature Granulocyte 0.4 %      Nucleated RBC 0.0 /100 WBC      Neutrophils Absolute 3.73 x10 3/uL      Abs Lymph Automated 3.30 (H) x10 3/uL  Abs Mono Automated 0.74 x10 3/uL      Abs Eos Automated 0.29 x10 3/uL      Absolute Baso Automated 0.06 x10 3/uL      Absolute Immature Granulocyte 0.03 x10 3/uL      Absolute NRBC 0.00 x10 3/uL                Radiology Results:      No orders to display               Scribe Attestation:      I was acting as a Neurosurgeon for Arlana Lindau, MD on Russ Halo M  Treatment Team: Scribe: Lincoln Brigham   I am the first provider for this patient and I personally performed the services documented. Treatment Team: Scribe: Lincoln Brigham is scribing for me on TOTO AMBROS,Makeya M. This note accurately reflects work and decisions made by me.  Arlana Lindau, MD            Arlana Lindau, MD  05/21/18 2147

## 2018-05-30 ENCOUNTER — Encounter (INDEPENDENT_AMBULATORY_CARE_PROVIDER_SITE_OTHER): Payer: Self-pay

## 2018-05-30 ENCOUNTER — Encounter (INDEPENDENT_AMBULATORY_CARE_PROVIDER_SITE_OTHER): Payer: Self-pay | Admitting: Hospitalist

## 2018-05-30 ENCOUNTER — Ambulatory Visit (INDEPENDENT_AMBULATORY_CARE_PROVIDER_SITE_OTHER): Payer: Self-pay | Admitting: Hospitalist

## 2018-05-30 VITALS — BP 114/72 | HR 108 | Temp 98.6°F | Resp 16 | Wt 123.0 lb

## 2018-05-30 DIAGNOSIS — R739 Hyperglycemia, unspecified: Secondary | ICD-10-CM

## 2018-05-30 DIAGNOSIS — L97509 Non-pressure chronic ulcer of other part of unspecified foot with unspecified severity: Secondary | ICD-10-CM

## 2018-05-30 DIAGNOSIS — E11621 Type 2 diabetes mellitus with foot ulcer: Secondary | ICD-10-CM

## 2018-05-30 DIAGNOSIS — E118 Type 2 diabetes mellitus with unspecified complications: Secondary | ICD-10-CM

## 2018-05-30 DIAGNOSIS — E559 Vitamin D deficiency, unspecified: Secondary | ICD-10-CM | POA: Insufficient documentation

## 2018-05-30 LAB — POCT GLUCOSE: Whole Blood Glucose POCT: 599 mg/dL — AB (ref 70–100)

## 2018-05-30 MED ORDER — VITAMIN D (ERGOCALCIFEROL) 1.25 MG (50000 UT) PO CAPS
50000.00 [IU] | ORAL_CAPSULE | ORAL | 0 refills | Status: DC
Start: 2018-05-30 — End: 2020-06-25

## 2018-05-30 MED ORDER — INSULIN SYRINGE 31G X 5/16" 0.5 ML MISC
0 refills | Status: DC
Start: 2018-05-30 — End: 2018-05-30

## 2018-05-30 MED ORDER — INSULIN NPH ISOPHANE & REGULAR (70-30) 100 UNIT/ML SC SUSP
20.00 [IU] | Freq: Two times a day (BID) | SUBCUTANEOUS | 2 refills | Status: DC
Start: 2018-05-30 — End: 2018-06-08

## 2018-05-30 MED ORDER — METFORMIN HCL 1000 MG PO TABS
1000.00 mg | ORAL_TABLET | Freq: Two times a day (BID) | ORAL | 2 refills | Status: AC
Start: 2018-05-30 — End: ?

## 2018-05-30 MED ORDER — INSULIN SYRINGE 31G X 5/16" 0.5 ML MISC
0 refills | Status: AC
Start: 2018-05-30 — End: ?

## 2018-05-30 MED ORDER — AMOXICILLIN-POT CLAVULANATE 875-125 MG PO TABS
1.00 | ORAL_TABLET | Freq: Two times a day (BID) | ORAL | 0 refills | Status: AC
Start: 2018-05-30 — End: 2018-06-09

## 2018-05-30 MED ORDER — DOXYCYCLINE HYCLATE 100 MG PO CAPS
100.00 mg | ORAL_CAPSULE | Freq: Two times a day (BID) | ORAL | 0 refills | Status: AC
Start: 2018-05-30 — End: 2018-06-09

## 2018-05-30 NOTE — Progress Notes (Signed)
Chief Complaint   Patient presents with   . Diabetes       History of Present Illness:     This patient is a 37 y.o. female with longstanding DM.  Off meds.  Recent ED visit for severe hyperglycemia.  New toe ulcer.  Sugar 599 in clinic.  Toe ulcer with drainage (see pic below).  No fevers.  Previously on insulin, metformin.  Was in [place] for a while and was on Trulicity.  No SOB, N/V, headache, blurry vision, thirst.   Hx Vit D deficiency.    Past Medical History:   Diagnosis Date   . Diabetes    . Gout    . Hypertension    . UTI (urinary tract infection)        Past Surgical History:   Procedure Laterality Date   . CESAREAN SECTION  x2   . CESAREAN SECTION         Family History   Problem Relation Age of Onset   . Diabetes Father    . Diabetes Brother    . Diabetes Sister        Social History     Social History   . Marital status: Married     Spouse name: N/A   . Number of children: N/A   . Years of education: N/A     Occupational History   . Not on file.     Social History Main Topics   . Smoking status: Never Smoker   . Smokeless tobacco: Never Used   . Alcohol use No   . Drug use: No   . Sexual activity: Yes     Partners: Male     Other Topics Concern   . Not on file     Social History Narrative    ** Merged History Encounter **            No Known Allergies      ROS    All systems reviewed and negative except as described above.    Physical Exam:     Vitals:    05/30/18 1453   BP: 114/72   Pulse: (!) 108   Resp: 16   Temp: 98.6 F (37 C)   SpO2: 98%     Wt Readings from Last 3 Encounters:   05/30/18 55.8 kg (123 lb)   02/13/18 55.8 kg (123 lb)   02/03/18 61.8 kg (136 lb 4.8 oz)       General: awake, alert, oriented x 3  HEENT: perrla, eomi, sclera anicteric,oropharynx clear without lesions, mucous membranes moist  Neck: supple, no lymphadenopathy, no thyromegaly, no JVD, no carotid bruits  Cardiovascular: S1, S2, regular rate and rhythm, no murmurs, rubs, or gallops  Lungs: clear to auscultation  bilaterally, without wheezing, rhonchi, or rales  Abdomen: soft, non tender, normal bowel sounds  Extremities: no clubbing, cyanosis, or edema  Skin: no rashes or lesions noted            Diagnostics:     Lab Results   Component Value Date    WBC 8.15 05/21/2018    HGB 10.7 (L) 05/21/2018    HCT 33.6 (L) 05/21/2018    PLT 422 (H) 05/21/2018    TRIG 358 (H) 07/06/2011    ALT 23 02/13/2018    AST 17 02/13/2018    NA 130 (L) 05/21/2018    K 4.9 05/21/2018    CL 95 (L) 05/21/2018    CREAT 0.9 05/21/2018  BUN 11.0 05/21/2018    CO2 23 05/21/2018    TSH 0.396 (L) 12/15/2005    INR 1.2 (H) 04/27/2014    GLU 484 (H) 05/21/2018    HGBA1C 14.8 (H) 04/27/2014       No results found.    Active Problems:     Patient Active Problem List   Diagnosis   . T2DM (type 2 diabetes mellitus)   . Vitamin D deficiency   . Hypertriglyceridemia       Assessment/Plan:     37 y.o. female with severe hyperglycemia, infected toe ulcer.  Start insulin 70/30 20 units bid  Restart Metformin  Abx - Doxy and Augmentin  Refilled VIt D  WIll get A1c, urine microalbumin/cre and also lipids  F/U pharamcy 1 week.  Refer to DM education.  Needs medical home - refer to [facility] when sugars better.    Kelsey Miles was seen today for diabetes.    Diagnoses and all orders for this visit:    Hyperglycemia  -     POCT glucose; Standing  -     POCT glucose    Type 2 diabetes mellitus with complication, without long-term current use of insulin  -     Hemoglobin A1C  -     Discontinue: Insulin Syringe-Needle U-100 (INSULIN SYRINGE .5CC/31GX5/16") 31G X 5/16" 0.5 ML Misc; Use 2x a day  -     insulin NPH-regular 70-30 MIXTURE (NOVOLIN 70/30) (70-30) 100 UNIT/ML injection; Inject 20 Units into the skin 2 (two) times daily before meals  -     metFORMIN (GLUCOPHAGE) 1000 MG tablet; Take 1 tablet (1,000 mg total) by mouth 2 (two) times daily with meals  -     Ambulatory referral to Diabetic Education  -     Lipid panel  -     Microalbumin, Random Urine with Creatinine  -      Insulin Syringe-Needle U-100 (INSULIN SYRINGE .5CC/31GX5/16") 31G X 5/16" 0.5 ML Misc; Use 2x a day    Vitamin D deficiency  -     vitamin D, ergocalciferol, (DRISDOL) 50000 UNIT Cap; Take 1 capsule (50,000 Units total) by mouth once a week    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, unspecified laterality, unspecified ulcer stage  -     amoxicillin-clavulanate (AUGMENTIN) 875-125 MG per tablet; Take 1 tablet by mouth 2 (two) times daily for 10 days  -     doxycycline (VIBRAMYCIN) 100 MG capsule; Take 1 capsule (100 mg total) by mouth 2 (two) times daily for 10 days        Follow up: 1 week  Discharged from Transitional Clinic?: No  Medical Home Referred to/Referred Back to: [facility]     Med Rec Complete: Yes  Referred to [facility] for diabetes A1c > 8.5: Yes  Given commitment to care documents when appropriate: N/A   Teach back for HF, COPD, DM, AMI, PNA: N/A

## 2018-05-30 NOTE — Progress Notes (Signed)
Met with patient- reviewed AVS (all meds sent to [facility] and syringes), will need to schedule appt for DM education can either call or stop at [address], and provided glucometer kit.  [employer] information provided as affordable option for glucometer and strips. Instructed on how to record blood glucose on log and to bring with her at next visit. Will discuss community clinic disposition at next visit. Patient voiced understanding.     Ebony Cargo  [facility] Coordinator  [facility]  [phone]

## 2018-05-31 ENCOUNTER — Encounter (INDEPENDENT_AMBULATORY_CARE_PROVIDER_SITE_OTHER): Payer: Self-pay | Admitting: Hospitalist

## 2018-05-31 DIAGNOSIS — E781 Pure hyperglyceridemia: Secondary | ICD-10-CM | POA: Insufficient documentation

## 2018-05-31 LAB — LIPID PANEL
Cholesterol / HDL Ratio: 13.5 ratio — ABNORMAL HIGH (ref 0.0–4.4)
Cholesterol: 324 mg/dL — ABNORMAL HIGH (ref 100–199)
HDL: 24 mg/dL — ABNORMAL LOW (ref >39)
Triglycerides: 1611 mg/dL (ref 0–149)

## 2018-05-31 LAB — MICROALBUMIN, RANDOM URINE
Creatinine, UR: 14.7 mg/dL
Microalb/Crt. Ratio: <20.4 mg/g creat (ref 0.0–30.0)
Microalbumin, UR: <3 ug/mL

## 2018-05-31 LAB — HEMOGLOBIN A1C: Hemoglobin A1C: 14.3 % — ABNORMAL HIGH (ref 4.8–5.6)

## 2018-06-01 ENCOUNTER — Other Ambulatory Visit (INDEPENDENT_AMBULATORY_CARE_PROVIDER_SITE_OTHER): Payer: Self-pay | Admitting: Hospitalist

## 2018-06-01 DIAGNOSIS — E781 Pure hyperglyceridemia: Secondary | ICD-10-CM

## 2018-06-01 MED ORDER — FENOFIBRATE 145 MG PO TABS
145.00 mg | ORAL_TABLET | Freq: Every day | ORAL | 2 refills | Status: AC
Start: 2018-06-01 — End: 2019-06-01

## 2018-06-01 NOTE — Progress Notes (Signed)
Added fenofibrate for severe hypertglycaridemia  Will recheck trig after glycemic control and determine if she still needs tx  Of note, trigs  in 2012 elevated

## 2018-06-05 ENCOUNTER — Other Ambulatory Visit (INDEPENDENT_AMBULATORY_CARE_PROVIDER_SITE_OTHER): Payer: Self-pay

## 2018-06-08 ENCOUNTER — Encounter (INDEPENDENT_AMBULATORY_CARE_PROVIDER_SITE_OTHER): Payer: Self-pay

## 2018-06-08 ENCOUNTER — Ambulatory Visit (INDEPENDENT_AMBULATORY_CARE_PROVIDER_SITE_OTHER): Payer: Self-pay

## 2018-06-08 DIAGNOSIS — E118 Type 2 diabetes mellitus with unspecified complications: Secondary | ICD-10-CM

## 2018-06-08 DIAGNOSIS — R739 Hyperglycemia, unspecified: Secondary | ICD-10-CM

## 2018-06-08 LAB — POCT GLUCOSE: Whole Blood Glucose POCT: 306 mg/dL — AB (ref 70–100)

## 2018-06-08 MED ORDER — INSULIN NPH ISOPHANE & REGULAR (70-30) 100 UNIT/ML SC SUSP
21.00 [IU] | Freq: Two times a day (BID) | SUBCUTANEOUS | 2 refills | Status: DC
Start: 2018-06-08 — End: 2018-08-25

## 2018-06-08 NOTE — Progress Notes (Signed)
History of Present Illness:     This patient is a 37 y.o. female who presents for a DM f/u appointment.    PMH:  DM-2, gout, HTN, hypertriglyceridemia, vit D deficiency, R great toe ulcer    Reports that she feels generally well with a mild HA for which she is taking Tylenol.  States that she takes all medications as prescribed, but was unaware of the Tricor prescription.  Denies AEs from medications including s/sx of hypoglycemia.  States that she continues to take the antibiotics as prescribed.  The R greater toe ulcer appears to be healing with no noticeable drainage.  Reports that she eats a low glycemic and low saturated fat diet.  Presents with a SMBG log.      SMFBG:  203-254 mg/dl (previous 7 days)  SMppBG:  292-321 mg/dl    Review of Systems:     ROS    All systems reviewed and negative except as described above.    Physical Exam:     Vitals:    06/08/18 1516   BP: 110/73   Pulse: 99   Resp: 16   Temp: 98.1 F (36.7 C)   SpO2: 98%       Wt Readings from Last 3 Encounters:   06/08/18 57 kg (125 lb 9.6 oz)   05/30/18 55.8 kg (123 lb)   02/13/18 55.8 kg (123 lb)         Medications:     Current Outpatient Prescriptions   Medication Sig   . amoxicillin-clavulanate (AUGMENTIN) 875-125 MG per tablet Take 1 tablet by mouth 2 (two) times daily for 10 days   . doxycycline (VIBRAMYCIN) 100 MG capsule Take 1 capsule (100 mg total) by mouth 2 (two) times daily for 10 days   . fenofibrate (TRICOR) 145 MG tablet Take 1 tablet (145 mg total) by mouth daily   . insulin NPH-regular 70-30 MIXTURE (NOVOLIN 70/30) (70-30) 100 UNIT/ML injection Inject 21 Units into the skin 2 (two) times daily before meals   . metFORMIN (GLUCOPHAGE) 1000 MG tablet Take 1 tablet (1,000 mg total) by mouth 2 (two) times daily with meals   . Insulin Syringe-Needle U-100 (INSULIN SYRINGE .5CC/31GX5/16") 31G X 5/16" 0.5 ML Misc Use 2x a day   . vitamin D, ergocalciferol, (DRISDOL) 50000 UNIT Cap Take 1 capsule (50,000 Units total) by  mouth once a week       Diagnostics:     Lab Results   Component Value Date    WBC 8.15 05/21/2018    HGB 10.7 (L) 05/21/2018    HCT 33.6 (L) 05/21/2018    PLT 422 (H) 05/21/2018    CHOL 324 (H) 05/30/2018    TRIG 1,611 (HH) 05/30/2018    HDL 24 (L) 05/30/2018    LDL Comment 05/30/2018    ALT 23 02/13/2018    AST 17 02/13/2018    NA 130 (L) 05/21/2018    K 4.9 05/21/2018    CL 95 (L) 05/21/2018    CREAT 0.9 05/21/2018    BUN 11.0 05/21/2018    CO2 23 05/21/2018    TSH 0.396 (L) 12/15/2005    INR 1.2 (H) 04/27/2014    GLU 484 (H) 05/21/2018    HGBA1C 14.3 (H) 05/30/2018       No results found.    Assessment:     Patient Active Problem List   Diagnosis   . T2DM (type 2 diabetes mellitus)   . Vitamin D deficiency   . Hypertriglyceridemia  Plan:     Deven was seen today for follow-up and diabetes.    Diagnoses and all orders for this visit:    Hyperglycemia  -     POCT glucose    Type 2 diabetes mellitus with complication, without long-term current use of insulin  -     insulin NPH-regular 70-30 MIXTURE (NOVOLIN 70/30) (70-30) 100 UNIT/ML injection; Inject 21 Units into the skin 2 (two) times daily before meals      DM:  The patient's BG is improved but not controlled per ADA.   -Begin injecting 21 units of NPH 70/30 insulin BID AC  -Continue taking Metformin 1000 mg BID  -Begin taking Tricor 145 mg tablet daily (counseled on where to obtain the medication and how to begin taking it)  -Counseled on s/sx/tx of hypoglycemia and maintaining a low glycemic diet  -Monitor SMBG daily, TGs, s/sx of hypoglycemia, A1c q92mo, N/V/D    Counseled to complete the antibiotics as prescribed.  Scheduled a f/u with a provider to assess the R greater toe ulcer once therapy is completed.    Continue Vitamin D as prescribed.    Follow up: 1 week  Discharged from [facility]itional Clinic?: No  Medical Home Referred to/Referred Back to: [facility]     Med Rec Complete: Yes  Referred to [facility] for diabetes A1c > 8.5: Yes   Given commitment to care documents when appropriate: Yes     Teach back for HF, COPD, DM, AMI, PNA: DM     Signed by Lynelle Smoke, PharmD 06/08/2018.    Greater than 50% of my time was spent counseling and educating the patient.

## 2018-06-15 ENCOUNTER — Ambulatory Visit (INDEPENDENT_AMBULATORY_CARE_PROVIDER_SITE_OTHER): Payer: Self-pay | Admitting: Nurse Practitioner

## 2018-06-19 ENCOUNTER — Ambulatory Visit: Payer: Self-pay

## 2018-06-19 DIAGNOSIS — E118 Type 2 diabetes mellitus with unspecified complications: Secondary | ICD-10-CM | POA: Insufficient documentation

## 2018-06-19 NOTE — Progress Notes (Deleted)
Knowledge and Learning Needs Assessment:    Ratings:   1 = needs instruction  2 = needs review  3 = comprehends key points  4 = demonstrates competency  N/a = not assessed    Knowledge of diabetes disease process: ***  Nutrition Management: ***  Understanding of Physical Activity: ***  Medication Use: ***  Monitoring and Using Results: ***  Preventing Acute Complications: ***  Psychosocial Adjustment: ***  Behavior Change Strategies: ***  Risk Reduction Strategies: ***      Met with Kyung Rudd for initial assessment of Diabetes after being referred by Lianne Bushy, MD for Diabetes Self Management Education. Pt is accompanied by : {Persons; family members:60370}  Latest A1C was 14.3% on 05/31/2018.    Medication/allergies/immunization has been reviewed: yes      History with diabetes:  Diagnosed with Type 2 diabetes about ***.    Medications for diabetes: Currently taking {DIABETES THERAPY BB:29204} Insulin 70/30 21 Units BID, Metformin 1000 mg BID,      Monitoring:     *** meter was given to patient at this visit. SMBG skills reviewed.    Patient return demonstrated proper use of a glucometer to this provider.    Patient already has a meter and is testing ***.   Advised to call *** doctor for prescription for more testing supplies. Patient verbalized understanding.    BG recommended goals discussed with patient. Encouraged patient to start to check BG daily fasting, and to test pre and 2 hours post start of largest meal ; 3 days a week.   Meaningful testing was explained.     Log book was provided. Importance of keeping records was discussed. Patient agrees to keep record of BG in log book.      Point of Care Blood Glucose:  *** mg/dl (states last meal was ***)      Blood Pressure:     Was within the recommended range (<140/90).   Was above target recommendation (>140/90).  Pt was advised to notify their physician and monitor their BP more regularly at home and track in a log.    Exercise:     Currently patient is *** for *** minutes.      Nutrition:    Patient reports since the diagnosis with diabetes, some changes to diet have included: ***.    The Healthy Plate Method was explained briefly.     Risk reduction:   Dilated eye exam with in the last 12 months: {YES/NO:22438}   Diabetic foot exam with in the last 12 months: {YES/NO:22438}     Counseling was provided to the patient on the benefits of yearly dilated eye exams and  diabetic foot exams in the prevention of diabetes-related complications.                   Discussion:   Patient created a behavior change goal today of: ***.    No learning deficit identified.    Pt is Spanish speaking. This provider is a Counsellor for Spanish.    The Educator has assessed their overall outcome of learning so far to be :{PSYCH LEARNING INTERVENTION:23678}    Patient was encouraged to email/call this provider with any concerns or questions.      Plan: Patient is scheduled for the following visits:   Individual DSMT/E, Pt will bring written foods records with them to next appointment.

## 2018-08-14 ENCOUNTER — Emergency Department: Payer: Self-pay

## 2018-08-14 ENCOUNTER — Emergency Department
Admission: EM | Admit: 2018-08-14 | Discharge: 2018-08-14 | Disposition: A | Discharge status: Home / Self Care | Payer: Self-pay | Attending: Emergency Medicine | Admitting: Emergency Medicine

## 2018-08-14 DIAGNOSIS — E119 Type 2 diabetes mellitus without complications: Secondary | ICD-10-CM | POA: Insufficient documentation

## 2018-08-14 DIAGNOSIS — R112 Nausea with vomiting, unspecified: Secondary | ICD-10-CM | POA: Insufficient documentation

## 2018-08-14 DIAGNOSIS — R109 Unspecified abdominal pain: Secondary | ICD-10-CM | POA: Insufficient documentation

## 2018-08-14 DIAGNOSIS — J40 Bronchitis, not specified as acute or chronic: Secondary | ICD-10-CM | POA: Insufficient documentation

## 2018-08-14 DIAGNOSIS — Z87440 Personal history of urinary (tract) infections: Secondary | ICD-10-CM | POA: Insufficient documentation

## 2018-08-14 DIAGNOSIS — I10 Essential (primary) hypertension: Secondary | ICD-10-CM | POA: Insufficient documentation

## 2018-08-14 DIAGNOSIS — M791 Myalgia, unspecified site: Secondary | ICD-10-CM | POA: Insufficient documentation

## 2018-08-14 DIAGNOSIS — M109 Gout, unspecified: Secondary | ICD-10-CM | POA: Insufficient documentation

## 2018-08-14 DIAGNOSIS — R079 Chest pain, unspecified: Secondary | ICD-10-CM | POA: Insufficient documentation

## 2018-08-14 DIAGNOSIS — Z794 Long term (current) use of insulin: Secondary | ICD-10-CM | POA: Insufficient documentation

## 2018-08-14 DIAGNOSIS — E785 Hyperlipidemia, unspecified: Secondary | ICD-10-CM | POA: Insufficient documentation

## 2018-08-14 LAB — CBC AND DIFFERENTIAL
Absolute NRBC: 0 10*3/uL (ref 0.00–0.00)
Basophils Absolute Automated: 0.07 10*3/uL (ref 0.00–0.08)
Basophils Automated: 1 %
Eosinophils Absolute Automated: 0.29 10*3/uL (ref 0.00–0.44)
Eosinophils Automated: 4.2 %
Hematocrit: 33 % — ABNORMAL LOW (ref 34.7–43.7)
Hgb: 10.3 g/dL — ABNORMAL LOW (ref 11.4–14.8)
Immature Granulocytes Absolute: 0.01 10*3/uL (ref 0.00–0.07)
Immature Granulocytes: 0.1 %
Lymphocytes Absolute Automated: 2.59 10*3/uL (ref 0.42–3.22)
Lymphocytes Automated: 37.3 %
MCH: 22.7 pg — ABNORMAL LOW (ref 25.1–33.5)
MCHC: 31.2 g/dL — ABNORMAL LOW (ref 31.5–35.8)
MCV: 72.8 fL — ABNORMAL LOW (ref 78.0–96.0)
MPV: 9.1 fL (ref 8.9–12.5)
Monocytes Absolute Automated: 0.4 10*3/uL (ref 0.21–0.85)
Monocytes: 5.8 %
Neutrophils Absolute: 3.59 10*3/uL (ref 1.10–6.33)
Neutrophils: 51.6 %
Nucleated RBC: 0 /100 WBC (ref 0.0–0.0)
Platelets: 428 10*3/uL — ABNORMAL HIGH (ref 142–346)
RBC: 4.53 10*6/uL (ref 3.90–5.10)
RDW: 15 % (ref 11–15)
WBC: 6.95 10*3/uL (ref 3.10–9.50)

## 2018-08-14 LAB — URINALYSIS REFLEX TO MICROSCOPIC EXAM - REFLEX TO CULTURE
Bilirubin, UA: NEGATIVE
Blood, UA: NEGATIVE
Glucose, UA: 500 — AB
Ketones UA: NEGATIVE
Leukocyte Esterase, UA: NEGATIVE
Nitrite, UA: NEGATIVE
Protein, UR: NEGATIVE
Specific Gravity UA: 1.03 (ref 1.001–1.035)
Urine pH: 6 (ref 5.0–8.0)
Urobilinogen, UA: NEGATIVE mg/dL (ref 0.2–2.0)

## 2018-08-14 LAB — COMPREHENSIVE METABOLIC PANEL
ALT: 16 U/L (ref 0–55)
AST (SGOT): 17 U/L (ref 5–34)
Albumin/Globulin Ratio: 1 (ref 0.9–2.2)
Albumin: 3.7 g/dL (ref 3.5–5.0)
Alkaline Phosphatase: 152 U/L — ABNORMAL HIGH (ref 37–106)
Anion Gap: 9 (ref 5.0–15.0)
BUN: 8 mg/dL (ref 7.0–19.0)
Bilirubin, Total: 0.2 mg/dL (ref 0.2–1.2)
CO2: 26 mEq/L (ref 22–29)
Calcium: 9 mg/dL (ref 8.5–10.5)
Chloride: 101 mEq/L (ref 100–111)
Creatinine: 0.7 mg/dL (ref 0.6–1.0)
Globulin: 3.6 g/dL (ref 2.0–3.6)
Glucose: 303 mg/dL — ABNORMAL HIGH (ref 70–100)
Potassium: 3.8 mEq/L (ref 3.5–5.1)
Protein, Total: 7.3 g/dL (ref 6.0–8.3)
Sodium: 136 mEq/L (ref 136–145)

## 2018-08-14 LAB — ECG 12-LEAD
Atrial Rate: 100 {beats}/min
P Axis: 60 degrees
P-R Interval: 144 ms
Q-T Interval: 384 ms
QRS Duration: 88 ms
QTC Calculation (Bezet): 495 ms
R Axis: -82 degrees
T Axis: 68 degrees
Ventricular Rate: 100 {beats}/min

## 2018-08-14 LAB — HEMOLYSIS INDEX: Hemolysis Index: 7 (ref 0–18)

## 2018-08-14 LAB — LIPASE: Lipase: 23 U/L (ref 8–78)

## 2018-08-14 LAB — GFR: EGFR: >60

## 2018-08-14 MED ORDER — ALBUTEROL SULFATE HFA 108 (90 BASE) MCG/ACT IN AERS
2.0000 | INHALATION_SPRAY | RESPIRATORY_TRACT | 0 refills | Status: AC | PRN
Start: 2018-08-14 — End: 2018-09-13

## 2018-08-14 MED ORDER — ONDANSETRON 4 MG PO TBDP
4.00 mg | ORAL_TABLET | Freq: Four times a day (QID) | ORAL | 0 refills | Status: DC | PRN
Start: 2018-08-14 — End: 2019-02-11

## 2018-08-14 MED ORDER — ALBUTEROL-IPRATROPIUM 2.5-0.5 (3) MG/3ML IN SOLN
3.00 mL | Freq: Once | RESPIRATORY_TRACT | Status: AC
Start: 2018-08-14 — End: 2018-08-14
  Administered 2018-08-14: 11:00:00 3 mL via RESPIRATORY_TRACT
  Filled 2018-08-14: qty 3

## 2018-08-14 MED ORDER — AZITHROMYCIN 250 MG PO TABS
ORAL_TABLET | ORAL | 0 refills | Status: AC
Start: 2018-08-14 — End: 2018-08-19

## 2018-08-14 NOTE — Discharge Instructions (Signed)
Thank you for choosing [facility] for your emergency care needs.   We strive to provide EXCELLENT care to you and your family.      If you do not continue to improve or your condition worsens, please contact your doctor or return immediately to the Emergency Department.    DOCTOR REFERRALS  Call [phone] if you need any further referrals and we can help you find a primary care doctor or specialist.  Also, available online at:  [email or web address]      FREE HEALTH SERVICES  If you need help with health or social services, please call 2-1-1 for a free referral to resources in your area.  2-1-1 is a free service connecting people with information on health insurance, free clinics, pregnancy, mental health, dental care, food assistance, housing, and substance abuse counseling.  Also, available online at:  http://[email or web address].org    MEDICAL RECORDS AND TESTS  Certain laboratory test results do not come back the same day, for example urine cultures.   We will contact you if other important findings are noted.  Radiology films are often reviewed again to ensure accuracy.  If there is any discrepancy, we will notify you.      ORTHOPEDIC INJURY   Please know that significant injuries can exist even when an initial x-ray is read as normal or negative.  This can occur because some fractures (broken bones) are not initially visible on x-rays.  For this reason, close outpatient follow-up with your primary care doctor or bone specialist (orthopedist) is required.    MEDICATIONS AND FOLLOWUP  Please be aware that some prescription medications can cause drowsiness.  Use caution when driving or operating machinery.    BLOOD PRESSURE  If at any time during your visit, the measured blood pressure was greater than 120 systolic (top number) or 80 diastolic (bottom number), please see you primary care physician in 1-2 days.     The examination and treatment you have received in our Emergency  Department is provided on an emergency basis, and is not intended to be a substitute for your primary care physician.  It is important that your doctor checks you again and that you report any new or remaining problems at that time.          Bronquitis     Bronchitis     1.  Se le ha diagnosticado una bronquitis.   1.  You have been diagnosed with bronchitis.             2.  Una bronquitis es una irritacin de los conductos respiratorios [place]. Puede ser causada por humo del tabaco, contaminacin del aire o una infeccin. Los pacientes con bronquitis tienen falta de aire y pueden toser y Scientist, physiological una mucosidad verde o [place]. Estos sntomas suelen empeorar por la noche, al estar acostado y en clima hmedo. Muchas personas con bronquitis no necesitan antibiticos. Si su mdico le receta antibiticos, surta la receta y tome todo el medicamento segn las instrucciones.   2.  Bronchitis is an irritation of the large breathing tubes. It can be caused by tobacco smoke, air pollution, or an infection. Patients with bronchitis are short of breath and may cough up green or yellow mucous. These symptoms are usually worse at night, when lying flat and in wet weather. Most people with bronchitis do not need antibiotics. If your doctor prescribes antibiotics, fill the prescription and take all the medicine according to the instructions.  3.  La bronquitis normalmente se trata con medicamentos para la tos. A veces se [place] albuterol (Ventolin/Proventil) para ayudar con la tos. Es Warehouse manager con un espaciador para ayudar a que el medicamento llegue a los pulmones. Su mdico le puede Doctor, general practice.   3.  Bronchitis is usually treated with medicine to help stop coughing. An inhaler with albuterol (Ventolin/Proventil) is sometimes used to help with cough. It is best to use the inhaler with a spacer to help the medicine reach your lungs. The doctor can prescribe a  spacer.             4.  La bronquitis suele ser causada por un virus. Los antibiticos no matan a los virus. De hecho, los antibiticos no afectan a los virus de Nurse, learning disability. Anteriormente, algunos mdicos recetaban antibiticos a las personas con bronquitis. Ahora sabemos que los antibiticos no ayudan a la mayora de los pacientes con bronquitis. Los pacientes que pueden beneficiarse de los antibiticos son aquellos con problemas pulmonares que no desaparecen, como enfisema o EPOC.   4.  Bronchitis is usually caused by a virus. Antibiotics do not kill viruses. In fact, antibiotics do not affect viruses in any way. In the past, some doctors prescribed antibiotics for people with bronchitis. We now know that antibiotics are not helpful for most bronchitis patients. Patients who might need antibiotics are those with lung problems that don't go away, like emphysema or COPD.             5.  Los sntomas de tos y tener sibilancias al respirar pueden continuar por hasta 2 a 3 semanas! Los sntomas deben mejorar durante este tiempo y no Copy.   5.  Your coughing and wheezing might last for 2 or 3 weeks! The symptoms should get better over this time period and not worse.             6.  Su mdico le ha recetado Educational psychologist de albuterol (Ventolin/Proventil). selo cada cuatro horas si al respirar tiene sibilancias, tos o le falta el aire. Esto reducir los sntomas.    6.  Your doctor prescribed an albuterol (Ventolin/Proventil) inhaler. Use it every four hours if you are wheezing, coughing, or short of breath. This will reduce symptoms.             7.  No fume. [employer] que el fumar causa enfermedades del corazn, Hotel manager, y defectos de nacimiento. El dejar de fumar le ayudar a mejorar el asma. Si fuma, pida a su mdico ideas sobre cmo dejar de fumar.   7.  Do not smoke. Research shows that smoking causes heart disease, cancer, and birth defects.  Avoiding smoking will help your asthma. If you smoke, ask your doctor for ideas about how to stop.      * Si no fuma, evite a quienes lo hagan.    * If you do not smoke, avoid others who do.             8.  DEBE BUSCAR ATENCIN MDICA INMEDIATA, AQU O EN LA SALA DE EMERGENCIAS MS CERCANA, SI SE PRESENTA CUALQUIERA DE LAS SIGUIENTES SITUACIONES:    8.  YOU SHOULD SEEK MEDICAL ATTENTION IMMEDIATELY, EITHER HERE OR AT THE NEAREST EMERGENCY DEPARTMENT, IF ANY OF THE FOLLOWING OCCURS:      * Si al respirar tiene sibilancias o tiene dificultad para respirar.    * You wheeze or have trouble breathing.      *  Tiene fiebre (temperatura mayor de 100.66F / 38C) que no desaparece.    * You have a fever (temperature higher than 100.66F / 38C), that won't go away.      * Tiene dolor de pecho.    * You have chest pain.      * Vomita o no puede retener lquidos o se siente dbil o mareado.    * You vomit or cannot keep liquids down or you feel weak or dizzy.      * Sus sntomas empeoran en los prximos 2  3 das.    * Your symptoms get worse over the next 2 or 3 days.

## 2018-08-14 NOTE — ED Provider Notes (Signed)
EMERGENCY DEPARTMENT HISTORY AND PHYSICAL EXAM     Physician/Midlevel provider first contact with patient: 08/14/18 0920         Date: 08/14/2018  Patient Name: Kelsey Miles    History of Presenting Illness     Chief Complaint   Patient presents with   . Cough   . Emesis   . Abdominal Pain     LUQ   . Flank Pain     R MID     Due to language barrier, an interpreter was present during the history-taking and subsequent discussion (and for part of the physical exam) with this patient.    History Provided By: Patient    Chief Complaint: cough  Duration: 1 week  Timing: constant   Location: pulmonary   Quality: mucus-producing   Severity: moderate  Exacerbating factors: deep breaths  Alleviating factors: none  Associated Symptoms: fatigue, CP, SOB, back pain, myalgia, weakness, n/v, abdominal pain  Pertinent Negatives: fever, dysuria, urinary frequency    Additional History: Kelsey Miles is a 37 y.o. female with a h/o DM and HLD presenting to the ED c/o a mucus producing cough with accompanying myalgia, and weakness which began 1 week ago. She notes 4 days of associated CP that radiates to her back that was worse last night, causing SOB and pain with deep breaths. Today AM, patient has had 1 episode of n/v and mentions left sided abdominal pain. She denies fever, dysuria, or urinary frequency. She is not a smoker and has not been around any sick contacts.       PCP: Pcp, Unknownorunabletoobtain, MD  SPECIALISTS:    No current facility-administered medications for this encounter.      Current Outpatient Medications   Medication Sig Dispense Refill   . insulin NPH-regular 70-30 MIXTURE (NOVOLIN 70/30) (70-30) 100 UNIT/ML injection Inject 21 Units into the skin 2 (two) times daily before meals 10 mL 2   . Insulin Syringe-Needle U-100 (INSULIN SYRINGE .5CC/31GX5/16") 31G X 5/16" 0.5 ML Misc Use 2x a day 100 each 0   . metFORMIN (GLUCOPHAGE) 1000 MG tablet Take 1 tablet (1,000 mg total) by mouth 2 (two) times daily  with meals 60 tablet 2   . albuterol (PROVENTIL HFA;VENTOLIN HFA) 108 (90 Base) MCG/ACT inhaler Inhale 2 puffs into the lungs every 4 (four) hours as needed for Wheezing or Shortness of Breath (coughing) Dispense with spacer 1 Inhaler 0   . azithromycin (ZITHROMAX) 250 MG tablet Take 2 tablets (500 mg total) by mouth daily for 1 day, THEN 1 tablet (250 mg total) daily for 4 days. 6 tablet 0   . fenofibrate (TRICOR) 145 MG tablet Take 1 tablet (145 mg total) by mouth daily 30 tablet 2   . ondansetron (ZOFRAN-ODT) 4 MG disintegrating tablet Take 1 tablet (4 mg total) by mouth every 6 (six) hours as needed for Nausea 8 tablet 0   . vitamin D, ergocalciferol, (DRISDOL) 50000 UNIT Cap Take 1 capsule (50,000 Units total) by mouth once a week 8 capsule 0       Past History     Past Medical History:  Past Medical History:   Diagnosis Date   . Diabetes    . Gout    . Hypertension    . UTI (urinary tract infection)        Past Surgical History:  Past Surgical History:   Procedure Laterality Date   . CESAREAN SECTION  x2   . CESAREAN SECTION  Family History:  Family History   Problem Relation Age of Onset   . Diabetes Father    . Diabetes Brother    . Diabetes Sister        Social History:  Social History     Tobacco Use   . Smoking status: Never Smoker   . Smokeless tobacco: Never Used   Substance Use Topics   . Alcohol use: No   . Drug use: No       Allergies:  No Known Allergies    Review of Systems     Review of Systems   Constitutional: Positive for fatigue.   Respiratory: Positive for cough and shortness of breath.    Cardiovascular: Positive for chest pain.   Gastrointestinal: Positive for abdominal pain, nausea and vomiting.   Musculoskeletal: Positive for back pain and myalgias.   Skin: Negative for rash.   Neurological: Positive for weakness.   Psychiatric/Behavioral: Negative for suicidal ideas.         Physical Exam   BP 103/67   Pulse 99   Temp 98 F (36.7 C) (Temporal)   Resp 16   Wt 56 kg   LMP  08/09/2018   SpO2 99%   BMI 21.19 kg/m     Physical Exam   Constitutional: Patient is alert.  Well nourished.  NAD  Head: Atraumatic.   Eyes: EOMI. PERRL  ENT:  MMM.   Neck:  FROM. No spinal tenderness. Neck supple.    Cardiovascular: Normal rate and regular rhythm.   Pulmonary/Chest: Effort normal and breath sounds normal. No respiratory distress.   Abdominal: Soft. There is no tenderness. Bowel sounds present and normal.    Musculoskeletal:  No lower extremity edema or tenderness.    Neurological: Patient is alert and oriented to person, place, and time.  No focal deficits.   Skin: Skin is warm and dry.    Diagnostic Study Results     Labs -     Results     Procedure Component Value Units Date/Time    Comprehensive Metabolic Panel (CMP) [[id number]]  (Abnormal) Collected:  08/14/18 1021    Specimen:  Blood Updated:  08/14/18 1043     Glucose 303 mg/dL      BUN 8.0 mg/dL      Creatinine 0.7 mg/dL      Sodium 409 mEq/L      Potassium 3.8 mEq/L      Chloride 101 mEq/L      CO2 26 mEq/L      Calcium 9.0 mg/dL      Protein, Total 7.3 g/dL      Albumin 3.7 g/dL      AST (SGOT) 17 U/L      ALT 16 U/L      Alkaline Phosphatase 152 U/L      Bilirubin, Total 0.2 mg/dL      Globulin 3.6 g/dL      Albumin/Globulin Ratio 1.0     Anion Gap 9.0    Narrative:       Replace urinary catheter prior to obtaining the urine culture  if it has been in place for greater than or equal to 14  days:->N/A No Foley  Indications for U/A Reflex to Micro - Reflex to  Culture:->Suprapubic Pain/Tenderness or Dysuria    Lipase [[id number]] Collected:  08/14/18 1021    Specimen:  Blood Updated:  08/14/18 1043     Lipase 23 U/L     Narrative:  Replace urinary catheter prior to obtaining the urine culture  if it has been in place for greater than or equal to 14  days:->N/A No Foley  Indications for U/A Reflex to Micro - Reflex to  Culture:->Suprapubic Pain/Tenderness or Dysuria    Hemolysis index [[id number]] Collected:  08/14/18 1021      Updated:  08/14/18 1043     Hemolysis Index 7    Narrative:       Replace urinary catheter prior to obtaining the urine culture  if it has been in place for greater than or equal to 14  days:->N/A No Foley  Indications for U/A Reflex to Micro - Reflex to  Culture:->Suprapubic Pain/Tenderness or Dysuria    GFR [[id number]] Collected:  08/14/18 1021     Updated:  08/14/18 1043     EGFR >60.0    Narrative:       Replace urinary catheter prior to obtaining the urine culture  if it has been in place for greater than or equal to 14  days:->N/A No Foley  Indications for U/A Reflex to Micro - Reflex to  Culture:->Suprapubic Pain/Tenderness or Dysuria    UA Reflex to Micro - Reflex to Culture [[id number]]  (Abnormal) Collected:  08/14/18 1021     Updated:  08/14/18 1034     Urine Type Urine, Clean Ca     Color, UA Yellow     Clarity, UA Hazy     Specific Gravity UA 1.030     Urine pH 6.0     Leukocyte Esterase, UA Negative     Nitrite, UA Negative     Protein, UR Negative     Glucose, UA 500     Ketones UA Negative     Urobilinogen, UA Negative mg/dL      Bilirubin, UA Negative     Blood, UA Negative     RBC, UA 0 - 2 /hpf      WBC, UA 0 - 5 /hpf      Squamous Epithelial Cells, Urine 0 - 5 /hpf      Urine Mucus Present    Narrative:       Replace urinary catheter prior to obtaining the urine culture  if it has been in place for greater than or equal to 14  days:->N/A No Foley  Indications for U/A Reflex to Micro - Reflex to  Culture:->Suprapubic Pain/Tenderness or Dysuria    CBC with differential [[id number]]  (Abnormal) Collected:  08/14/18 1021    Specimen:  Blood Updated:  08/14/18 1030     WBC 6.95 x10 3/uL      Hgb 10.3 g/dL      Hematocrit 62.9 %      Platelets 428 x10 3/uL      RBC 4.53 x10 6/uL      MCV 72.8 fL      MCH 22.7 pg      MCHC 31.2 g/dL      RDW 15 %      MPV 9.1 fL      Neutrophils 51.6 %      Lymphocytes Automated 37.3 %      Monocytes 5.8 %      Eosinophils Automated 4.2 %      Basophils Automated 1.0 %       Immature Granulocyte 0.1 %      Nucleated RBC 0.0 /100 WBC      Neutrophils Absolute 3.59 x10 3/uL      Abs Lymph Automated 2.59 x10  3/uL      Abs Mono Automated 0.40 x10 3/uL      Abs Eos Automated 0.29 x10 3/uL      Absolute Baso Automated 0.07 x10 3/uL      Absolute Immature Granulocyte 0.01 x10 3/uL      Absolute NRBC 0.00 x10 3/uL     Narrative:       Replace urinary catheter prior to obtaining the urine culture  if it has been in place for greater than or equal to 14  days:->N/A No Foley  Indications for U/A Reflex to Micro - Reflex to  Culture:->Suprapubic Pain/Tenderness or Dysuria          Radiologic Studies -   Radiology Results (24 Hour)     Procedure Component Value Units Date/Time    Chest 2 Views [[id number]] Collected:  08/14/18 1047    Order Status:  Completed Updated:  08/14/18 1051    Narrative:       Frontal and lateral chest x-ray    Indication:     Cough shortness of breath    Comparison: Chest x-ray 02/01/2018    Findings: Cardio mediastinal silhouette is within normal limits. Lungs  are clear. No pneumonia, vascular congestion, or pleural effusion.     Bones are unremarkable.      Impression:        No acute cardiopulmonary findings.     Lorinda Creed, MD   08/14/2018 10:47 AM      .    Medical Decision Making   I am the first provider for this patient.    I reviewed the vital signs, available nursing notes, past medical history, past surgical history, family history and social history.    Vital Signs-Reviewed the patient's vital signs.     Patient Vitals for the past 12 hrs:   BP Temp Pulse Resp   08/14/18 0917 103/67 98 F (36.7 C) 99 16       Pulse Oximetry Analysis - Normal 99% on RA    Cardiac Monitor:  Rate: 100  Rhythm:  Normal Sinus Rhythm     EKG:  Interpreted by the EP.   Time Interpreted: 10:20 AM   Rate: 100   Rhythm: Normal Sinus Rhythm    Interpretation: QTc: 494    Old Medical Records: Nursing notes.     ED Course:     9:31 AM - Informed pt of intent to administer medication  and observe in the ED. She is agreeable to the plan.    11:04 AM - Patient feels better. Updated pt on results. Discussed f/[facility] with [facility], usage of prescribed medications, home self care, discharge instructions, and return precautions with patient. Possibility of evolving illness reviewed. All questions solicited and addressed. Patient states understanding and amenable to discharge.     Provider Notes: Cough with productive sputum x1 week.  Nausea vomiting today.  Labs and x-ray unremarkable.  Azithromycin.  F/U PMD.     Diagnosis     Clinical Impression:   1. Bronchitis    2. Nausea and vomiting, intractability of vomiting not specified, unspecified vomiting type        Treatment Plan:   ED Disposition     ED Disposition Condition Date/Time Comment    Discharge  Mon Aug 14, 2018 11:05 AM Alinda Money [facility] discharge to home/self care.    Condition at disposition: Stable            _______________________________  Attestations: This note is prepared by Vincent Gros, acting as scribe for Ulice Bold, MD.    Ulice Bold, MD - The scribe's documentation has been prepared under my direction and personally reviewed by me in its entirety.  I confirm that the note above accurately reflects all work, treatment, procedures, and medical decision making performed by me.    _______________________________     Tenny Craw, MD  08/15/18 2014

## 2018-08-14 NOTE — ED Triage Notes (Signed)
Pt presented to ED c/o cough x 1 week with phlegm and R upper and mid flank and back pain, n/v with acid reflux x1 this morning. Pt denies fever no sore throat denies diarrhea. Pt did not take any meds today.

## 2018-08-24 ENCOUNTER — Emergency Department
Admission: EM | Admit: 2018-08-24 | Discharge: 2018-08-25 | Disposition: A | Discharge status: Home / Self Care | Payer: Self-pay | Attending: Emergency Medicine | Admitting: Emergency Medicine

## 2018-08-24 DIAGNOSIS — Z760 Encounter for issue of repeat prescription: Secondary | ICD-10-CM | POA: Insufficient documentation

## 2018-08-24 DIAGNOSIS — E785 Hyperlipidemia, unspecified: Secondary | ICD-10-CM | POA: Insufficient documentation

## 2018-08-24 DIAGNOSIS — J40 Bronchitis, not specified as acute or chronic: Secondary | ICD-10-CM | POA: Insufficient documentation

## 2018-08-24 DIAGNOSIS — J069 Acute upper respiratory infection, unspecified: Secondary | ICD-10-CM | POA: Insufficient documentation

## 2018-08-24 DIAGNOSIS — E1165 Type 2 diabetes mellitus with hyperglycemia: Secondary | ICD-10-CM | POA: Insufficient documentation

## 2018-08-24 DIAGNOSIS — Z794 Long term (current) use of insulin: Secondary | ICD-10-CM | POA: Insufficient documentation

## 2018-08-24 DIAGNOSIS — I10 Essential (primary) hypertension: Secondary | ICD-10-CM | POA: Insufficient documentation

## 2018-08-24 DIAGNOSIS — E118 Type 2 diabetes mellitus with unspecified complications: Secondary | ICD-10-CM

## 2018-08-24 DIAGNOSIS — R739 Hyperglycemia, unspecified: Secondary | ICD-10-CM

## 2018-08-24 DIAGNOSIS — M109 Gout, unspecified: Secondary | ICD-10-CM | POA: Insufficient documentation

## 2018-08-24 LAB — I-STAT CG4 VENOUS CARTRIDGE
Lactic Acid I-Stat: 2.1 mmol/L — ABNORMAL HIGH (ref 0.2–2.0)
i-STAT Base Excess Venous: 4 mEq/L
i-STAT FIO2: 21
i-STAT HCO3 Bicarbonate Venous: 24.6 mEq/L
i-STAT O2 Saturation Venous: 99 %
i-STAT Patient Temperature: 98.3
i-STAT Total CO2 Venous: 25 mEq/L (ref 24.0–29.0)
i-STAT pCO2 Venous: 24.7
i-STAT pH Venous: 7.605
i-STAT pO2 Venous: 131

## 2018-08-24 LAB — CBC AND DIFFERENTIAL
Absolute NRBC: 0 10*3/uL (ref 0.00–0.00)
Basophils Absolute Automated: 0.07 10*3/uL (ref 0.00–0.08)
Basophils Automated: 0.8 %
Eosinophils Absolute Automated: 0.29 10*3/uL (ref 0.00–0.44)
Eosinophils Automated: 3.3 %
Hematocrit: 31.4 % — ABNORMAL LOW (ref 34.7–43.7)
Hgb: 10.1 g/dL — ABNORMAL LOW (ref 11.4–14.8)
Immature Granulocytes Absolute: 0.02 10*3/uL (ref 0.00–0.07)
Immature Granulocytes: 0.2 %
Lymphocytes Absolute Automated: 3.02 10*3/uL (ref 0.42–3.22)
Lymphocytes Automated: 34.8 %
MCH: 23.4 pg — ABNORMAL LOW (ref 25.1–33.5)
MCHC: 32.2 g/dL (ref 31.5–35.8)
MCV: 72.9 fL — ABNORMAL LOW (ref 78.0–96.0)
MPV: 10.1 fL (ref 8.9–12.5)
Monocytes Absolute Automated: 0.74 10*3/uL (ref 0.21–0.85)
Monocytes: 8.5 %
Neutrophils Absolute: 4.53 10*3/uL (ref 1.10–6.33)
Neutrophils: 52.4 %
Nucleated RBC: 0 /100 WBC (ref 0.0–0.0)
Platelets: 340 10*3/uL (ref 142–346)
RBC: 4.31 10*6/uL (ref 3.90–5.10)
RDW: 17 % — ABNORMAL HIGH (ref 11–15)
WBC: 8.67 10*3/uL (ref 3.10–9.50)

## 2018-08-24 LAB — BASIC METABOLIC PANEL
BUN: 13 mg/dL (ref 7.0–19.0)
CO2: 23 mEq/L (ref 22–29)
Calcium: 8.7 mg/dL (ref 8.5–10.5)
Chloride: 98 mEq/L — ABNORMAL LOW (ref 100–111)
Creatinine: 0.7 mg/dL (ref 0.6–1.0)
Glucose: 405 mg/dL — ABNORMAL HIGH (ref 70–100)
Potassium: 5 mEq/L (ref 3.5–5.1)
Sodium: 130 mEq/L — ABNORMAL LOW (ref 136–145)

## 2018-08-24 LAB — GFR: EGFR: >60

## 2018-08-24 MED ORDER — SODIUM CHLORIDE 0.9 % IV BOLUS
1000.00 mL | Freq: Once | INTRAVENOUS | Status: AC
Start: 2018-08-24 — End: 2018-08-25
  Administered 2018-08-24: 23:00:00 1000 mL via INTRAVENOUS

## 2018-08-24 MED ORDER — ALBUTEROL-IPRATROPIUM 2.5-0.5 (3) MG/3ML IN SOLN
3.00 mL | Freq: Once | RESPIRATORY_TRACT | Status: AC
Start: 2018-08-24 — End: 2018-08-25
  Administered 2018-08-25: 3 mL via RESPIRATORY_TRACT
  Filled 2018-08-24: qty 3

## 2018-08-24 MED ORDER — SODIUM CHLORIDE 0.9 % IV BOLUS
2000.00 mL | Freq: Once | INTRAVENOUS | Status: AC
Start: 2018-08-24 — End: 2018-08-25
  Administered 2018-08-24: 23:00:00 2000 mL via INTRAVENOUS

## 2018-08-24 NOTE — EDIE (Signed)
COLLECTIVE?NOTIFICATION?08/24/2018 21:59?Kelsey Miles, Kelsey Miles?MRN: [id number]    Criteria Met      5 ED Visits in 12 Months    Security and Safety  No recent Security Events currently on file    ED Care Guidelines  There are currently no ED Care Guidelines for this patient. Please check your facility's medical records system.        Prescription Monitoring Program  000??- Narcotic Use Score  000??- Sedative Use Score  000??- Stimulant Use Score  000??- Overdose Risk Score  - All Scores range from 000-999 with 75% of the population scoring < 200 and on 1% scoring above 650  - The last digit of the narcotic, sedative, and stimulant score indicates the number of active prescriptions of that type  - Higher Use scores correlate with increased prescribers, pharmacies, mg equiv, and overlapping prescriptions  - Higher Overdose Risk Scores correlate with increased risk of unintentional overdose death   Concerning or unexpectedly high scores should prompt a review of the PMP record; this does not constitute checking PMP for prescribing purposes.      E.D. Visit Count (12 mo.)  Facility Visits   [place] [facility] 2   [facility] 3   Total 5   Note: Visits indicate total known visits.      Recent Emergency Department Visit Summary  Date Facility [facility] Type Diagnoses or Chief Complaint   Aug 24, 2018 [place] [facility] [place] [facility]      medic - hyperglycemia      Aug 14, 2018 [place] [facility] [place] [facility] [place] [facility]      abd \\T \ flank pain      Emesis      Cough      Flank Pain      Abdominal Pain      Nausea with vomiting, unspecified      Bronchitis, not specified as acute or chronic      May 21, 2018 [place] [facility] [facility] 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 [place]      Medic 410-UTI/flank pain      Flank Pain       Cystitis, unspecified without hematuria      Feb 01, 2018 [place] [facility] [place] [facility] [place]      fever, cough      Fever      Cough      Generalized weakness      Shortness of Breath      Shortness of breath      Urinary tract infection, site not specified      Other fatigue      Long term (current) use of insulin      Elevated blood-pressure reading, w/o diagnosis of htn          Recent Inpatient Visit Summary  No recorded inpatient visits.     Care Team  There is not a care team on record at this time.   Collective Portal  This patient has registered at the [facility] Emergency Department   For more information visit: [email or web address]     PLEASE NOTE:     1.  Any care recommendations and other clinical information are provided as guidelines or for historical purposes only, and providers should exercise their own clinical judgment when providing care.    2.   You may only use this information for purposes of treatment, payment or health care operations activities, and subject to the limitations of applicable [employer] Policies.    3.   You should consult directly with the organization that provided a care guideline or other clinical history with any questions about additional information or accuracy or completeness of information provided.    ? 2019 [employer] - [email or web address]

## 2018-08-24 NOTE — ED Provider Notes (Signed)
The patient was seen and examined by PA or Fellow; plan of care was discussed with me. I have also seen and examined the patient personally and concur with PA/NP plan of care.     EKG Interpretation  Reviewed and Interpreted by ER physician, Idamae Schuller, MD   Rhythm: Sinus  Rate: 100  Intervals/conduction: Mildly prolonged QTc.  Improved from previous  ST Segments/T wave: No ischemic appearing ST elevations/depressions. No T wave changes.        37 y.o. F h/o HTN, HLD, DM p/w progressively worsening bronchitis, HA, generalized weakness. Previously dx with bronchitis and only took azithro for 5 days. Pt notes she has been out of her DM meds for 3 days. No fever, n/v/d.      Idamae Schuller, MD  11:14 PM  08/24/2018      I was acting as a scribe for Virl Cagey, MD on Kelsey Miles  Treatment Team: Scribe: Rondall Allegra     I am the first provider for this patient and I personally performed the services documented. Treatment Team: Scribe: Rondall Allegra is scribing for me on Kelsey Miles,Kelsey M. This note and the patient instructions accurately reflect work and decisions made by me.  Virl Cagey, MD       Virl Cagey, MD  09/09/18 [phone]

## 2018-08-24 NOTE — ED Provider Notes (Signed)
[facility] EMERGENCY DEPARTMENT APP H&P         CLINICAL SUMMARY          Diagnosis:    .     Final diagnoses:   Hyperglycemia   Viral URI with cough   Medication refill         MDM Notes:    Patient is a 37 year old female past medical history of hypertension, hyperlipidemia, diabetes with worsening bronchitis, headache and generalized weakness over the past several days.  Patient states that she was diagnosed with bronchitis and took prescription for azithromycin last week.  Patient states that she has not been feeling well and has been out of her diabetic insulin medications for the past 3 days but has been taking metformin.  Patient denies fevers, nausea, vomiting, diarrhea, chest pain, shortness of breath.  On exam patient appears tired decreased breath sounds in right lower lung base.  Chest x-ray with no pneumonia, labs reveal hyperglycemia.  Patient given 3 L of IV fluids as well as Toradol patient states that she is feeling better.  Will discharge patient home with close follow-up PCP also wrote patient's prescription for previously prescribed insulin.  Patient understands need for close follow-up with PCP for reevaluation and insulin adjustment.  Patient understands return precautions to the ER and feels comfortable with discharge plan home.    Differential diagnosis: Viral URI, pneumonia, UTI, hyperglycemia, DKA      Disposition:         Discharge         Discharge Prescriptions     Medication Sig Dispense Auth. Provider    insulin NPH-regular 70-30 MIXTURE (NOVOLIN 70/30) (70-30) 100 UNIT/ML injection Inject 21 Units into the skin 2 (two) times daily before meals 10 mL Tommy Medal, PA                    CLINICAL INFORMATION        HPI:      Chief Complaint: Generalized weakness and Hyperglycemia  .      [facility] Liam [name] Gillermo Murdoch is a 38 y.o. female past medical history of HTN, HLD, DM 2 who presents with worsening bronchitis over the past week.  Patient also notes generalized headache as well as  weakness.  Patient states she was diagnosed with bronchitis last week and prescribed azithromycin antibiotics which she took for 5 days.  Patient states she did not feel any relief and has been feeling worse ever since.  Patient also notes she has been out of her diabetic medications for the past 3 days and states she is had increased thirst as well as urination.  Patient denies chest pain, shortness of breath, abdominal pain, nausea, vomiting, fever, burning with urination, back pain, one-sided weakness, changes in speech, changes in vision.    History obtained from: Patient  Live hospital interpreter used.            ROS:      Positive and negative ROS elements as per HPI.  All other systems reviewed and negative.      Physical Exam:      Pulse (!) 109  BP 133/87  Resp 16  SpO2 95 %  Temp 98.3 F (36.8 C)    Physical Exam  Vitals signs and nursing note reviewed.   Constitutional:       Comments: Drowsy but easily arousable with verbal stimuli   HENT:      Head: Normocephalic and atraumatic.  Nose: Nose normal.      Mouth/Throat:      Mouth: Mucous membranes are dry.   Eyes:      Extraocular Movements: Extraocular movements intact.      Pupils: Pupils are equal, round, and reactive to light.   Neck:      Musculoskeletal: Normal range of motion and neck supple.   Cardiovascular:      Rate and Rhythm: Normal rate and regular rhythm.      Pulses: Normal pulses.   Pulmonary:      Effort: Pulmonary effort is normal.      Breath sounds: No rhonchi.      Comments: Decreased breath sound in right lower lung base  Abdominal:      General: Abdomen is flat. Bowel sounds are normal. There is no distension.      Palpations: Abdomen is soft.      Tenderness: There is no tenderness. There is no guarding or rebound.   Musculoskeletal: Normal range of motion.   Skin:     General: Skin is warm and dry.   Neurological:      General: No focal deficit present.      Mental Status: She is oriented to person, place, and time.       Sensory: No sensory deficit.      Motor: No weakness.      Coordination: Coordination normal.   Psychiatric:         Mood and Affect: Mood normal.         Behavior: Behavior normal.                   PAST HISTORY        Primary Care Provider: Lennox Pippins, MD        PMH/PSH:    .     Past Medical History:   Diagnosis Date   . Diabetes    . Gout    . Hypertension    . UTI (urinary tract infection)        She has a past surgical history that includes Cesarean section (x2) and Cesarean section.      Social/Family History:      She reports that she has never smoked. She has never used smokeless tobacco. She reports that she does not drink alcohol or use drugs.    Family History   Problem Relation Age of Onset   . Diabetes Father    . Diabetes Brother    . Diabetes Sister          Listed Medications on Arrival:    .     Discharge Medication List as of 08/25/2018  3:50 AM      CONTINUE these medications which have NOT CHANGED    Details   albuterol (PROVENTIL HFA;VENTOLIN HFA) 108 (90 Base) MCG/ACT inhaler Inhale 2 puffs into the lungs every 4 (four) hours as needed for Wheezing or Shortness of Breath (coughing) Dispense with spacer, Starting Mon 08/14/2018, Until Wed 09/13/2018, Normal      fenofibrate (TRICOR) 145 MG tablet Take 1 tablet (145 mg total) by mouth daily, Starting Thu 06/01/2018, Until Fri 06/01/2019, Normal      Insulin Syringe-Needle U-100 (INSULIN SYRINGE .5CC/31GX5/16") 31G X 5/16" 0.5 ML Misc Use 2x a day, Normal      metFORMIN (GLUCOPHAGE) 1000 MG tablet Take 1 tablet (1,000 mg total) by mouth 2 (two) times daily with meals, Starting Tue 05/30/2018, Normal      ondansetron (ZOFRAN-ODT)  4 MG disintegrating tablet Take 1 tablet (4 mg total) by mouth every 6 (six) hours as needed for Nausea, Starting Mon 08/14/2018, Normal      vitamin D, ergocalciferol, (DRISDOL) 50000 UNIT Cap Take 1 capsule (50,000 Units total) by mouth once a week, Starting Tue 05/30/2018, Normal            Allergies: She  has No Known Allergies.            VISIT INFORMATION        Clinical Course in the ED:      3:45 AM-patient states that she is feeling better feels comfortable discharge plan home, states that she will follow-up with her PCP.        Medications Given in the ED:    .     ED Medication Orders (From admission, onward)    Start Ordered     Status Ordering Provider    08/25/18 [phone] 08/25/18 0232  ketorolac (TORADOL) injection 30 mg  Once     Route: Intravenous  Ordered Dose: 30 mg     Last MAR action:  Given Fredrick Dray L    08/24/18 2342 08/24/18 2342  albuterol-ipratropium (DUO-NEB) 2.5-0.5(3) mg/3 mL nebulizer 3 mL  RT - Once     Route: Nebulization  Ordered Dose: 3 mL     Last MAR action:  Given Solaris Kram L    08/24/18 2318 08/24/18 2317  sodium chloride 0.9 % bolus 2,000 mL  Once     Route: Intravenous  Ordered Dose: 2,000 mL     Last MAR action:  Stopped Jobie Popp L    08/24/18 2216 08/24/18 2215  sodium chloride 0.9 % bolus 1,000 mL  Once     Route: Intravenous  Ordered Dose: 1,000 mL     Last MAR action:  Stopped BENGHANEM, GHOFRANE            Procedures:      Procedures      Interpretations:                   RESULTS        Lab Results:      Results     Procedure Component Value Units Date/Time    Glucose Whole Blood - POCT [[id number]]  (Abnormal) Collected:  08/25/18 0307     Updated:  08/25/18 0310     POCT - Glucose Whole blood 295 mg/dL     Urine HCG POC [[id number]] Collected:  08/25/18 0228     Updated:  08/25/18 0228     POCT QC Pass     POCT Pregnancy HCG Test, UR Negative     Comment: Negative Value is Normal in Healthy Males or Healthy non-pregnant Females    i-Stat CG4 Venous CartrIDge [[id number]]  (Abnormal) Collected:  08/25/18 0215     Updated:  08/25/18 0223     i-STAT pH Venous 7.438     i-STAT pCO2 Venous 41.9     i-STAT pO2 Venous 56.0     i-STAT HCO3 Bicarbonate Venous 28.4 mEq/L      i-STAT Total CO2 Venous 30.0 mEq/L      i-STAT Base Excess Venous 4.0 mEq/L      i-STAT O2  Saturation Venous 90.0 %      i-STAT Lactic acid 1.3 mmol/L      i-STAT Patient Temperature 97.9 F     i-STAT FIO2 21     i-STAT O2 Delivery Room Air  i-STAT Allen's Test NA     i-STAT ECMO No     i-STAT Draw Site Venous    UA Reflex to Micro - Reflex to Culture [[id number]]  (Abnormal) Collected:  08/25/18 0015     Updated:  08/25/18 0040     Urine Type Urine, Clean Ca     Color, UA Straw     Clarity, UA Clear     Specific Gravity UA 1.025     Urine pH 6.0     Leukocyte Esterase, UA Negative     Nitrite, UA Negative     Protein, UR Negative     Glucose, UA >=500     Ketones UA Negative     Urobilinogen, UA Normal mg/dL      Bilirubin, UA Negative     Blood, UA Negative    Narrative:       Replace urinary catheter prior to obtaining the urine culture  if it has been in place for greater than or equal to 14  days:->N/A No Foley  Indications for U/A Reflex to Micro - Reflex to  Culture:->Suprapubic Pain/Tenderness or Dysuria    Rapid influenza A/B antigens [[id number]] Collected:  08/24/18 2316    Specimen:  Nasopharyngeal from Nasal Aspirate Updated:  08/25/18 0033    Narrative:       ORDER#: [id number]                                    ORDERED BY: Osamu Olguin  SOURCE: Nasal Aspirate                               COLLECTED:  08/24/18 23:16  ANTIBIOTICS AT COLL.:                                RECEIVED :  08/25/18 00:12  Influenza Rapid Antigen A&B                FINAL       08/25/18 00:33  08/25/18   Negative for Influenza A and B             Reference Range: Negative      Rapid Strep (Group A Antigen) [[id number]] Collected:  08/24/18 2316    Specimen:  Throat Updated:  08/25/18 0032     Group A Strep, Rapid Antigen Negative    CBC with differential [[id number]]  (Abnormal) Collected:  08/24/18 2316    Specimen:  Blood Updated:  08/24/18 2349     WBC 8.67 x10 3/uL      Hgb 10.1 g/dL      Hematocrit 75.6 %      Platelets 340 x10 3/uL      RBC 4.31 x10 6/uL      MCV 72.9 fL      MCH 23.4 pg      MCHC 32.2 g/dL       RDW 17 %      MPV 10.1 fL      Neutrophils 52.4 %      Lymphocytes Automated 34.8 %      Monocytes 8.5 %      Eosinophils Automated 3.3 %      Basophils Automated 0.8 %      Immature Granulocyte 0.2 %  Nucleated RBC 0.0 /100 WBC      Neutrophils Absolute 4.53 x10 3/uL      Abs Lymph Automated 3.02 x10 3/uL      Abs Mono Automated 0.74 x10 3/uL      Abs Eos Automated 0.29 x10 3/uL      Absolute Baso Automated 0.07 x10 3/uL      Absolute Immature Granulocyte 0.02 x10 3/uL      Absolute NRBC 0.00 x10 3/uL     Narrative:       Replace urinary catheter prior to obtaining the urine culture  if it has been in place for greater than or equal to 14  days:->N/A No Foley  Indications for U/A Reflex to Micro - Reflex to  Culture:->Suprapubic Pain/Tenderness or Dysuria    Basic Metabolic Panel [[id number]]  (Abnormal) Collected:  08/24/18 2316    Specimen:  Blood Updated:  08/24/18 2346     Glucose 405 mg/dL      BUN 40.9 mg/dL      Creatinine 0.7 mg/dL      Calcium 8.7 mg/dL      Sodium 811 mEq/L      Potassium 5.0 mEq/L      Chloride 98 mEq/L      CO2 23 mEq/L     Narrative:       Replace urinary catheter prior to obtaining the urine culture  if it has been in place for greater than or equal to 14  days:->N/A No Foley  Indications for U/A Reflex to Micro - Reflex to  Culture:->Suprapubic Pain/Tenderness or Dysuria    GFR [[id number]] Collected:  08/24/18 2316     Updated:  08/24/18 2346     EGFR >60.0    Narrative:       Replace urinary catheter prior to obtaining the urine culture  if it has been in place for greater than or equal to 14  days:->N/A No Foley  Indications for U/A Reflex to Micro - Reflex to  Culture:->Suprapubic Pain/Tenderness or Dysuria    i-Stat CG4 Venous CartrIDge [[id number]]  (Abnormal) Collected:  08/24/18 2322     Updated:  08/24/18 2328     i-STAT pH Venous 7.605     i-STAT pCO2 Venous 24.7     i-STAT pO2 Venous 131.0     i-STAT HCO3 Bicarbonate Venous 24.6 mEq/L      i-STAT Total CO2 Venous 25.0  mEq/L      i-STAT Base Excess Venous 4.0 mEq/L      i-STAT O2 Saturation Venous 99.0 %      i-STAT Lactic acid 2.1 mmol/L      i-STAT Patient Temperature 98.3 F     i-STAT FIO2 21     i-STAT O2 Delivery Room Air     i-STAT Allen's Test NA     i-STAT Draw Site Venous              Radiology Results:      XR Chest 2 Views   Final Result    Radiographic examination of the chest shows no acute   abnormality. 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 The lungs   are otherwise clear. There is no significant change from the prior exam.   There is no evidence of pneumonia.  Miguel Dibble, MD    08/25/2018 1:19 AM                  Scribe Attestation:      No scribe involved in the care of this patient            Tommy Medal, [place]  08/25/18 1610

## 2018-08-25 ENCOUNTER — Emergency Department: Payer: Self-pay

## 2018-08-25 LAB — POCT PREGNANCY TEST, URINE HCG: POCT Pregnancy HCG Test, UR: NEGATIVE

## 2018-08-25 LAB — URINALYSIS REFLEX TO MICROSCOPIC EXAM - REFLEX TO CULTURE
Bilirubin, UA: NEGATIVE
Blood, UA: NEGATIVE
Glucose, UA: >=500 — AB
Ketones UA: NEGATIVE
Leukocyte Esterase, UA: NEGATIVE
Nitrite, UA: NEGATIVE
Protein, UR: NEGATIVE
Specific Gravity UA: 1.025 (ref 1.001–1.035)
Urine pH: 6 (ref 5.0–8.0)
Urobilinogen, UA: NORMAL mg/dL (ref 0.2–2.0)

## 2018-08-25 LAB — I-STAT CG4 VENOUS CARTRIDGE
Lactic Acid I-Stat: 1.3 mmol/L (ref 0.2–2.0)
i-STAT Base Excess Venous: 4 mEq/L
i-STAT FIO2: 21
i-STAT HCO3 Bicarbonate Venous: 28.4 mEq/L
i-STAT O2 Saturation Venous: 90 %
i-STAT Patient Temperature: 97.9
i-STAT Total CO2 Venous: 30 mEq/L — ABNORMAL HIGH (ref 24.0–29.0)
i-STAT pCO2 Venous: 41.9
i-STAT pH Venous: 7.438
i-STAT pO2 Venous: 56

## 2018-08-25 LAB — GROUP A STREP, RAPID ANTIGEN: Group A Strep, Rapid Antigen: NEGATIVE

## 2018-08-25 LAB — GLUCOSE WHOLE BLOOD - POCT: Whole Blood Glucose POCT: 295 mg/dL — ABNORMAL HIGH (ref 70–100)

## 2018-08-25 MED ORDER — KETOROLAC TROMETHAMINE 30 MG/ML IJ SOLN
30.00 mg | Freq: Once | INTRAMUSCULAR | Status: AC
Start: 2018-08-25 — End: 2018-08-25
  Administered 2018-08-25: 03:00:00 30 mg via INTRAVENOUS
  Filled 2018-08-25: qty 1

## 2018-08-25 MED ORDER — INSULIN NPH ISOPHANE & REGULAR (70-30) 100 UNIT/ML SC SUSP
21.00 [IU] | Freq: Two times a day (BID) | SUBCUTANEOUS | 2 refills | Status: AC
Start: 2018-08-25 — End: ?

## 2018-08-25 NOTE — Discharge Instructions (Signed)
Gracias por elegir Financial planner (Emergency Department) de [facility], el primero en el rea de [place].  Espero que su consulta de hoy haya sido [place].      Thank you for choosing the [facility], the premier emergency department in the [place] area.  I hope your visit today was EXCELLENT.                       Instrucciones especficas para su consulta de hoy:      Specific instructions for your visit today:        Follow-up with the PCP in your clinic  You will need a blood sugar recheck with your PCP as your sugar today was elevated  Drink at least 126 ounces of water daily avoid sugary drinks and eat a lower carb diet  Rest and take medications as prescribed  Return to the ER if you develop fevers, nausea, vomiting, chest pain, shortness of breath, sugars that you cannot control or any other concerning symptoms.          SI USTED NO SIGUE MEJORANDO O SI SU CONDICIN EMPEORA, COMUNQUESE CON SU MDICO O ACUDA DE INMEDIATO AL DEPARTAMENTO DE EMERGENCIAS.        IF YOU DO NOT CONTINUE TO IMPROVE OR YOUR CONDITION WORSENS, PLEASE CONTACT YOUR DOCTOR OR RETURN IMMEDIATELY TO THE EMERGENCY DEPARTMENT.                    Harrel Carina, MD  Mdico especialista en emergencias  [place] de emergencias de [facility]        Sincerely,  Virl Cagey, MD  Attending Emergency Physician  [facility] Emergency Department                [facility] EN [facility]  [place] servicio de farmacia en el hospital se encuentra en el rea de espera de la sala de emergencias.  Abre los 7 das de la semana de 9 am a 9 pm.  Trabajamos con los seguros mdicos ms importantes y nuestros precios son competitivos en comparacin con los de otros proveedores.  Pida a su proveedor que imprima su prescripcin para la farmacia, de modo que pueda llegar a casa ms rpido.     ONSITE PHARMACY  Our full service onsite pharmacy is located in the  ER waiting room.  Open 7 days a week from 9 am to 9 pm.  We accept all major insurances and prices are competitive with major retailers.  Ask your provider to print your prescriptions down to the pharmacy to speed you on your way home.             CMO OBTENER UNA CITA PARA RECIBIR ATENCIN MDICA PRIMARIA    Los mdicos de atencin primaria (PCP, por sus siglas en ingls) son internistas o mdicos de cabecera. Ambos tipos de PCP se enfocan en el fomento de [employer], la prevencin de [place], la educacin y la asesora del paciente y el tratamiento de condiciones mdicas agudas y crnicas.    Llame para concertar una cita con un mdico de atencin primaria.  Pregunte qu mdico est recibiendo [employer].     [facility]  [place]: [phone]  [email or web address]     OBTAINING A PRIMARY CARE APPOINTMENT    Primary care physicians (PCPs, also known as primary care doctors) are either internists or family medicine doctors. Both types  of PCPs focus on health promotion, disease prevention, patient education and counseling, and treatment of acute and chronic medical conditions.    Call for an appointment with a primary care doctor.  Ask to see who is taking new patients.       [facility]  telephone:  [phone]  [email or web address]             REMISIONES A MDICOS  Llame al [phone] (disponible las 24 horas al da, los 7 das de la semana) si necesita una remisin y podremos ayudarlo a Clinical research associate un mdico de atencin primaria o Music therapist.  Tambin est disponible en lnea en la pgina web: [email or web address]     DOCTOR REFERRALS  Call [phone] (available 24 hours a day, 7 days a week) if you need any further referrals and we can help you find a primary care doctor or specialist.  Also, available online at:  [email or web address]             INFORMACIN DE CONTACTO  Antes de marcharse, verifique en el registro que su nmero de  contacto est actualizado.  Puede llamar al [place] al [phone] para actualizar su informacin.  Si tiene preguntas sobre la factura del hospital, llame al [phone].  Si tiene preguntas sobre la factura del mdico del departamento de emergencias, llame al [phone].       YOUR CONTACT INFORMATION  Before leaving please check with registration to make sure we have an up-to-date contact number.  You can call registration at [phone] to update your information.  For questions about your hospital bill, please call [phone].  For questions about your Emergency Dept Physician bill please call [phone].               SERVICIOS MDICOS GRATUITOS  Si necesita ayuda con servicios mdicos o sociales, llame al 2-1-1 para recibir informacin [employer] recursos en su rea.  2-1-1 es un servicio gratuito que brinda a las personas informacin sobre seguros mdicos, clnicas gratuitas, Psychiatrist, salud mental, atencin dental, apoyo alimentario, vivienda y asesora para tratar la drogadiccin.  Tambin est disponible en lnea en la pgina web: http://www.RankInsider.nl.     FREE HEALTH SERVICES  If you need help with health or social services, please call 2-1-1 for a free referral to resources in your area.  2-1-1 is a free service connecting people with information on health insurance, free clinics, pregnancy, mental health, dental care, food assistance, housing, and substance abuse counseling.  Also, available online at:  http://www.211[place].org             EXPEDIENTES MDICOS Y PRUEBAS  Los resultados de [address] pruebas de laboratorio no se reciben el mismo da, [employer] del cultivo de [place].   Nos comunicaremos con usted si observamos otros hallazgos de importancia.  Las placas de radiologa se suelen revisar dos veces para garantizar la exactitud.  Si existe alguna discrepancia, se lo notificaremos.      Llame al [phone] para recoger un disco compacto (CD) de obsequio con los  estudios radiolgicos que se le hicieron.  Si usted o su mdico desean solicitar una copia de su expediente mdico, llame al [phone].       MEDICAL RECORDS AND TESTS  Certain laboratory test results do not come back the same day, for example urine cultures.   We will contact you if other important findings are noted.  Radiology films are often reviewed again to ensure accuracy.  If there is any discrepancy, we will  notify you.      Please call [phone] to pick up a complimentary CD of any radiology studies performed.  If you or your doctor would like to request a copy of your medical records, please call [phone].             LESIONES ORTOPDICAS   Tenga en cuenta que puede haber lesiones considerables incluso en casos en los que una radiografa inicial arroje resultados normales o negativos.  Esto puede deberse a que algunas fracturas (huesos rotos) no son visibles inicialmente en las radiografas.  Por este motivo, es necesario que el mdico de atencin primaria o un Glass blower/designer en huesos (ortopeda o Barista) le hagan un seguimiento ambulatorio detallado.     ORTHOPEDIC INJURY   Please know that significant injuries can exist even when an initial x-ray is read as normal or negative.  This can occur because some fractures (broken bones) are not initially visible on x-rays.  For this reason, close outpatient follow-up with your primary care doctor or bone specialist (orthopedist) is required.             MEDICAMENTOS Y SEGUIMIENTO  Tenga en cuenta que algunos medicamentos prescritos pueden producir somnolencia.  Sea cuidadoso al [employer] u operar maquinaria mientras toma estos medicamentos.    Las evaluaciones y los tratamientos que recibi en nuestro departamento de emergencias se proveen en caso de emergencia y no tienen el propsito de Microbiologist a su mdico de atencin primaria.  Es importante que su mdico lo examine de nuevo y que usted le notifique cualquier problema nuevo o que [place].        MEDICATIONS AND FOLLOWUP  Please be aware that some prescription medications can cause drowsiness.  Use caution when driving or operating machinery.    The examination and treatment you have received in our Emergency Department is provided on an emergency basis, and is not intended to be a substitute for your primary care physician.  It is important that your doctor checks you again and that you report any new or remaining problems at that time.               FARMACIAS DE 24 HORAS AL DA  La farmacia de 24 horas al da ms cercana es:    [facility]  [address]  [place], [place] [address]  [phone]     24 HOUR PHARMACIES  The nearest 24 hour pharmacy is:    [facility]  [address]  [place], [place] [address]  [phone]             ASISTENCIA CON EL SEGURO    Ley de Atencin de Salud Asequible (ACA, por sus siglas en ingls)  Llame para comenzar o terminar una solicitud, comparar planes, inscribirse o hacer preguntas.  [phone]  TTY: [phone]  Pgina web: Healthcare.gov    Ayuda para inscribirse en Medicaid  Cover [place]  [phone] (LNEA GRATUITA)  [phone] (TTY)  Pgina web: http://www.coverva.org    Ayuda local para inscribirse en la ACA  [place] [facility] (NVFS)  [phone] (CENTRAL TELEFNICA)  Correo electrnico: health-help@nvfs .org  Pgina web: [place].com.ee  Direccin: [address], [address] [place], [place] [address]     ASSISTANCE WITH INSURANCE    Affordable Care Act  (ACA)  Call to start or finish an application, compare plans, enroll or ask a question.  [phone]  TTY: [phone]  Web:  Healthcare.gov    Help Enrolling  in [facility]  Cover [place]  7654810267 (TOLL-FREE)  [phone] (TTY)  Web:  Http://www.coverva.org    Local Help Enrolling in the [facility]  [place] [facility]  [phone] (MAIN)  Email:  health-help@nvfs .org  Web:  BlackjackMyths.is  Address:  [address] [place], [place] [address]             MEDICAMENTOS SEDANTES  Los medicamentos sedantes abarcan medicamentos fuertes para Chief Technology Officer (p. ej., narcticos), relajantes musculares, benzodiacepinas (se usan para combatir la ansiedad y como relajante muscular), Benadryl o difenhidramina y dems antihistamnicos que se usan para combatir las Therapist, art o la comezn y otros medicamentos.  Si no est seguro si se le suministr un medicamento sedante, pregunte a su mdico o enfermero(a).  Si se le suministr un medicamento sedante: NO conduzca un automvil. NO opere maquinaria. NO realice trabajos en los que deba estar alerta.  NO consuma bebidas alcohlicas mientras tome este medicamento.   SEDATING MEDICATIONS  Sedating medications include strong pain medications (e.g. narcotics), muscle relaxers, benzodiazepines (used for anxiety and as muscle relaxers), Benadryl/diphenhydramine and other antihistamines for allergic reactions/itching, and other medications.  If you are unsure if you have received a sedating medication, please ask your physician or nurse.  If you received a sedating medication: DO NOT drive a car. DO NOT operate machinery. DO NOT perform jobs where you need to be alert.  DO NOT drink alcoholic beverages while taking this medicine.               Si se siente mareado, sintese o recustese al observar los primeros sntomas. [place] y baje las escaleras con cuidado.  Tenga extremo cuidado para evitar cadas.   If you get dizzy, sit or lie down at the first signs. Be careful going up and down stairs.  Be extra careful to prevent falls.             Nunca d este medicamento a Economist.   Never give this medicine to others.             Mantenga este medicamento fuera del alcance de los nios.   Keep this medicine out of reach of children.             No tome o guarde medicamentos viejos. Deschelos cuando se venzan.     Guarde los [employer] en un lugar seco y fresco. NO los  guarde en el gabinete de medicinas del bao o en un gabinete que se encuentre encima de la estufa.   Do not take or save old medicines. Throw them away when outdated.     Keep all medicines in a cool, dry place. DO NOT keep them in your bathroom medicine cabinet or in a cabinet above the stove.             RESURTIDO DE MEDICAMENTOS  Tenga en cuenta que no podemos resurtir medicamentos prescritos en la sala de emergencias. Si necesita un tratamiento adicional al que se Radiographer, therapeutic en la sala de Sports administrator, consulte a su mdico de atencin primaria o al especialista en el tratamiento del dolor.   MEDICATION REFILLS  Please be aware that we cannot refill any prescriptions through the ER. If you need further treatment from what is provided at your ER visit, please follow up with your primary care doctor or your pain management specialist.             DEPARTAMENTOS INDEPENDIENTES DE EMERGENCIAS DE [facility]  [facility]  [address] que [place] cuenta con dos salas de emergencias independientes a unas cuantas millas?  En la sala de emergencias de [employer] en [place] y en la sala de emergencias en [place] el tiempo de espera es corto, el estacionamiento es gratis en frente del edificio y reciben altas calificaciones de satisfaccin por parte de los [place]. Igualmente, estas cuentan con los mismos mdicos de emergencias certificados de [facility].     FREESTANDING EMERGENCY DEPARTMENTS OF [facility]  Did you know [facility] has two freestanding ERs located just a few miles away?  [facility] [place] and [address] ER of [place] have short wait times, easy free parking directly in front of the building and top patient satisfaction scores - and the same Board Certified Emergency Medicine doctors as [facility].                   Diabetes Mellitus, Tipo I     Diabetes Mellitus, Type I     1.  Usted ha sido atendido por diabetes.   1.  You have been seen for diabetes.              2.  [employer] factores que mantienen bajo control sus niveles de Banker. Seguir su dieta y aplicarse las inyecciones de insulina segn se lo hayan indicado. Esto se debe a que usted tiene diabetes juvenil (tambin conocido como diabetes tipo uno). Utilice su insulina tal y [place] mdico regular se lo indic. La diabetes sin tratar puede causar problemas cardacos y renales (incluyendo insuficiencia renal). Tambin puede causar apoplejas y ceguera. Por lo tanto, es MUY IMPORTANTE darle seguimiento con su mdico regular.   2.  There are only two things to keep your blood sugar levels under control. This is following your diet and insulin injections as prescribed. 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 Use your insulin as your regular doctor directed. Untreated diabetes can lead to heart problems and kidney problems (including kidney failure). It can also lead to stroke and blindness. Therefore, it is VERY IMPORTANT to follow up with your regular doctor.             3.  Revise su nivel de azcar en la sangre antes de cada comida y antes de irse a dormir. Mantenga un registro de sus niveles de Banker. Mustrelo a su mdico en su siguiente visita.   3.  Check your blood sugar before every meal and before bedtime. Keep a record of your blood sugars. Show it to your doctor at your next visit.             4.  Utilice la escala para medir su insulina regular tal y como el mdico se lo ha indicado.   4.  Use the sliding scale with Regular Insulin as your doctor prescribed.      * La insulina reduce el nivel de azcar en la sangre. Si su nivel de azcar en la sangre disminuye demasiado, puede sentirse [place]. Tambin puede comenzar a sudar e incluso desmayarse. Siempre cargue con usted un bocadillo que Psychologist, clinical. Si comienza a presentar estos sntomas, cmase el bocadillo para ver si sus sntomas comienzan a  desaparecer. NUNCA conduzca un auto cuando se sienta de esta forma.    * Insulin lowers the blood sugar. If your blood sugar level gets too low, you may feel  lightheaded. You may also sweat and even pass out. Always keep a snack food that contains sugar with you. If you begin to have these symptoms, eat the snack food to see if the symptoms get better. NEVER drive a car when feeling this way.             5.  DEBE BUSCAR ATENCIN MDICA INMEDIATAMENTE, AQU O EN LA SALA DE EMERGENCIAS MS CERCANA, SI SE PRESENTA CUALQUIERA DE LAS SIGUIENTES SITUACIONES:   5.  YOU SHOULD SEEK MEDICAL ATTENTION IMMEDIATELY, EITHER HERE OR AT THE NEAREST EMERGENCY DEPARTMENT, IF ANY OF THE FOLLOWING OCCURS:      * Si tiene dolor abdominal (de estmago).    * Abdominal (belly) pain.      * Si tiene ms de 3 episodios de vmitos Programmer, multimedia).    * More than 3 vomiting (throwing up) episodes.      * Si presenta un nivel de azcar en la sangre mayor a 400 en ms de 3 ocasiones.    * Blood sugar over 400 more than 3 times.      * Si tiene fiebre (temperatura mayor de 100.29F/38C) o escalofros.    * Fever (temperature higher than 100.29F / 38C) or shaking chills.      * Si no puede retener los [employer].    * Unable to keep medicines down.      * Si tiene problemas para tragar o respirar.    * Any trouble swallowing or breathing.      * Si tiene infeccin o sarpullido en la piel.    * Infection or rash on your skin.      * Si presenta un bajo nivel de azcar (menos de 70) por 3 o ms ocasiones que no mejora inmediatamente despus de comer.    * Low blood sugar (less than 70) 3 or more times that does not get better immediately after eating.                      Hiperglucemia     Hyperglycemia     1.  Durante su visita de hoy, se descubri que su nivel de azcar en la sangre estaba muy alto.   1.  During the visit today, your blood sugar was found to be high.              2.  El trmino mdico para un alto nivel de azcar en la sangre es hiperglucemia. Esto puede ser un evento nico, pero podra significar que tiene diabetes. La diabetes sin tratar puede causar problemas cardacos y renales (incluyendo insuficiencia renal). Tambin puede causar apoplejas y ceguera. Es muy importante que d seguimiento con su mdico regular para que le vuelvan a revisar su nivel de Banker.   2.  The medical term for high blood sugar is hyperglycemia. This may be a one-time event, but it could mean you have diabetes. Untreated diabetes can lead to heart problems and kidney problems (including kidney failure). It can also lead to stroke and blindness. It is very important to follow up with your regular doctor to have your blood sugar re-checked.             3.  Dgale a su mdico regular que su nivel de azcar en la sangre result alto hoy. Su mdico querr volver a English as a second language teacher. Tambin querr solicitar otras pruebas de laboratorio. Si el mdico descubre que tiene diabetes, necesitar  medicamentos y [place] dieta especial para controlar sus niveles de azcar.   3.  Tell your regular doctor that your blood sugar was high today. Your doctor will want to recheck your blood. He or she may also want to order other lab tests. If the doctor finds that you have diabetes, you will need medicine and a special diet to control your blood sugar.             4.  DEBE BUSCAR ATENCIN MDICA INMEDIATAMENTE, AQU O EN LA SALA DE EMERGENCIAS MS CERCANA, SI SE PRESENTA CUALQUIERA DE LAS SIGUIENTES SITUACIONES:   4.  YOU SHOULD SEEK MEDICAL ATTENTION IMMEDIATELY, EITHER HERE OR AT THE NEAREST EMERGENCY DEPARTMENT, IF ANY OF THE FOLLOWING OCCURS:      * Si tiene confusin o letargos (muy somnoliento y con dificultad para despertar).    * Confusion or lethargy (very sleepy and hard to wake up).      * Si presenta sntomas de deshidratacin. Estos signos incluyen Systems developer) con menor frecuencia, boca seca, fatiga extrema (cansancio), mareos o desmayos.    * Signs of dehydration. Signs include less urination (peeing), dry mouth, extreme fatigue (tiredness), lightheadedness or fainting.      * Si tiene vmitos constantes Programmer, multimedia).    * Constant vomiting (throwing up).      * Si tiene fiebre (temperatura mayor de 100.36F/38C) o escalofros.    * Fever (temperature higher than 100.36F / 38C) or shaking chills.      * Si tiene dolor abdominal (estmago) o vmitos Programmer, multimedia).    * Abdominal (belly) pain or vomiting (throwing up).

## 2019-02-08 ENCOUNTER — Emergency Department: Payer: Medicaid Other

## 2019-02-08 ENCOUNTER — Inpatient Hospital Stay
Admission: EM | Admit: 2019-02-08 | Discharge: 2019-02-11 | DRG: 137 | Disposition: A | Discharge status: Home / Self Care | Payer: Medicaid Other | Attending: Internal Medicine | Admitting: Internal Medicine

## 2019-02-08 DIAGNOSIS — D509 Iron deficiency anemia, unspecified: Secondary | ICD-10-CM | POA: Diagnosis present

## 2019-02-08 DIAGNOSIS — E119 Type 2 diabetes mellitus without complications: Secondary | ICD-10-CM

## 2019-02-08 DIAGNOSIS — J1289 Other viral pneumonia: Secondary | ICD-10-CM | POA: Diagnosis present

## 2019-02-08 DIAGNOSIS — R0789 Other chest pain: Secondary | ICD-10-CM

## 2019-02-08 DIAGNOSIS — Z8249 Family history of ischemic heart disease and other diseases of the circulatory system: Secondary | ICD-10-CM

## 2019-02-08 DIAGNOSIS — B3749 Other urogenital candidiasis: Secondary | ICD-10-CM | POA: Diagnosis present

## 2019-02-08 DIAGNOSIS — E785 Hyperlipidemia, unspecified: Secondary | ICD-10-CM | POA: Diagnosis present

## 2019-02-08 DIAGNOSIS — Z7984 Long term (current) use of oral hypoglycemic drugs: Secondary | ICD-10-CM

## 2019-02-08 DIAGNOSIS — Z794 Long term (current) use of insulin: Secondary | ICD-10-CM

## 2019-02-08 DIAGNOSIS — R112 Nausea with vomiting, unspecified: Secondary | ICD-10-CM

## 2019-02-08 DIAGNOSIS — E876 Hypokalemia: Secondary | ICD-10-CM | POA: Diagnosis present

## 2019-02-08 DIAGNOSIS — Z20822 Contact with and (suspected) exposure to covid-19: Secondary | ICD-10-CM | POA: Diagnosis present

## 2019-02-08 DIAGNOSIS — Z833 Family history of diabetes mellitus: Secondary | ICD-10-CM

## 2019-02-08 DIAGNOSIS — J9601 Acute respiratory failure with hypoxia: Secondary | ICD-10-CM | POA: Diagnosis present

## 2019-02-08 DIAGNOSIS — J189 Pneumonia, unspecified organism: Secondary | ICD-10-CM

## 2019-02-08 DIAGNOSIS — I10 Essential (primary) hypertension: Secondary | ICD-10-CM | POA: Diagnosis present

## 2019-02-08 DIAGNOSIS — E1165 Type 2 diabetes mellitus with hyperglycemia: Secondary | ICD-10-CM | POA: Diagnosis present

## 2019-02-08 LAB — HEMOLYSIS INDEX
Hemolysis Index: 15 (ref 0–18)
Hemolysis Index: 3 (ref 0–18)
Hemolysis Index: 4 (ref 0–18)
Hemolysis Index: 4 (ref 0–18)

## 2019-02-08 LAB — LACTATE DEHYDROGENASE: LDH: 156 U/L (ref 125–331)

## 2019-02-08 LAB — CBC AND DIFFERENTIAL
Absolute NRBC: 0 10*3/uL (ref 0.00–0.00)
Absolute NRBC: 0 10*3/uL (ref 0.00–0.00)
Basophils Absolute Automated: 0.04 10*3/uL (ref 0.00–0.08)
Basophils Absolute Automated: 0.02 10*3/uL (ref 0.00–0.08)
Basophils Automated: 0.4 %
Basophils Automated: 0.3 %
Eosinophils Absolute Automated: 0.34 10*3/uL (ref 0.00–0.44)
Eosinophils Absolute Automated: 0.26 10*3/uL (ref 0.00–0.44)
Eosinophils Automated: 3 %
Eosinophils Automated: 3.6 %
Hematocrit: 32 % — ABNORMAL LOW (ref 34.7–43.7)
Hematocrit: 32.2 % — ABNORMAL LOW (ref 34.7–43.7)
Hgb: 10.5 g/dL — ABNORMAL LOW (ref 11.4–14.8)
Hgb: 10.2 g/dL — ABNORMAL LOW (ref 11.4–14.8)
Immature Granulocytes Absolute: 0.06 10*3/uL (ref 0.00–0.07)
Immature Granulocytes Absolute: 0.02 10*3/uL (ref 0.00–0.07)
Immature Granulocytes: 0.5 %
Immature Granulocytes: 0.3 %
Lymphocytes Absolute Automated: 2.2 10*3/uL (ref 0.42–3.22)
Lymphocytes Absolute Automated: 1.96 10*3/uL (ref 0.42–3.22)
Lymphocytes Automated: 19.4 %
Lymphocytes Automated: 27.1 %
MCH: 24.2 pg — ABNORMAL LOW (ref 25.1–33.5)
MCH: 24.1 pg — ABNORMAL LOW (ref 25.1–33.5)
MCHC: 32.8 g/dL (ref 31.5–35.8)
MCHC: 31.7 g/dL (ref 31.5–35.8)
MCV: 73.9 fL — ABNORMAL LOW (ref 78.0–96.0)
MCV: 76.1 fL — ABNORMAL LOW (ref 78.0–96.0)
MPV: 9.2 fL (ref 8.9–12.5)
MPV: 9.3 fL (ref 8.9–12.5)
Monocytes Absolute Automated: 0.55 10*3/uL (ref 0.21–0.85)
Monocytes Absolute Automated: 0.43 10*3/uL (ref 0.21–0.85)
Monocytes: 4.8 %
Monocytes: 5.9 %
Neutrophils Absolute: 8.17 10*3/uL — ABNORMAL HIGH (ref 1.10–6.33)
Neutrophils Absolute: 4.54 10*3/uL (ref 1.10–6.33)
Neutrophils: 71.9 %
Neutrophils: 62.8 %
Nucleated RBC: 0 /100 WBC (ref 0.0–0.0)
Nucleated RBC: 0 /100 WBC (ref 0.0–0.0)
Platelets: 461 10*3/uL — ABNORMAL HIGH (ref 142–346)
Platelets: 434 10*3/uL — ABNORMAL HIGH (ref 142–346)
RBC: 4.33 10*6/uL (ref 3.90–5.10)
RBC: 4.23 10*6/uL (ref 3.90–5.10)
RDW: 17 % — ABNORMAL HIGH (ref 11–15)
RDW: 17 % — ABNORMAL HIGH (ref 11–15)
WBC: 11.36 10*3/uL — ABNORMAL HIGH (ref 3.10–9.50)
WBC: 7.23 10*3/uL (ref 3.10–9.50)

## 2019-02-08 LAB — URINALYSIS, REFLEX TO MICROSCOPIC EXAM IF INDICATED
Bilirubin, UA: NEGATIVE
Glucose, UA: 500 — AB
Leukocyte Esterase, UA: NEGATIVE
Nitrite, UA: NEGATIVE
Protein, UR: 100 — AB
Specific Gravity UA: 1.017 (ref 1.001–1.035)
Urine pH: 8 (ref 5.0–8.0)
Urobilinogen, UA: NEGATIVE mg/dL (ref 0.2–2.0)

## 2019-02-08 LAB — HEMOGLOBIN A1C
Average Estimated Glucose: 317.8 mg/dL
Hemoglobin A1C: 12.7 % — ABNORMAL HIGH (ref 4.6–5.9)

## 2019-02-08 LAB — IHS D-DIMER
D-Dimer: 0.55 ug/mL FEU — ABNORMAL HIGH (ref 0.00–0.50)
D-Dimer: 0.29 ug/mL FEU (ref 0.00–0.50)

## 2019-02-08 LAB — COMPREHENSIVE METABOLIC PANEL
ALT: 16 U/L (ref 0–55)
ALT: 15 U/L (ref 0–55)
AST (SGOT): 17 U/L (ref 5–34)
AST (SGOT): 14 U/L (ref 5–34)
Albumin/Globulin Ratio: 0.8 — ABNORMAL LOW (ref 0.9–2.2)
Albumin/Globulin Ratio: 0.8 — ABNORMAL LOW (ref 0.9–2.2)
Albumin: 3.2 g/dL — ABNORMAL LOW (ref 3.5–5.0)
Albumin: 2.9 g/dL — ABNORMAL LOW (ref 3.5–5.0)
Alkaline Phosphatase: 144 U/L — ABNORMAL HIGH (ref 37–106)
Alkaline Phosphatase: 117 U/L — ABNORMAL HIGH (ref 37–106)
Anion Gap: 12 (ref 5.0–15.0)
Anion Gap: 9 (ref 5.0–15.0)
BUN: 12 mg/dL (ref 7.0–19.0)
BUN: 10 mg/dL (ref 7.0–19.0)
Bilirubin, Total: 0.4 mg/dL (ref 0.2–1.2)
Bilirubin, Total: 0.2 mg/dL (ref 0.2–1.2)
CO2: 24 mEq/L (ref 22–29)
CO2: 24 mEq/L (ref 22–29)
Calcium: 9.1 mg/dL (ref 8.5–10.5)
Calcium: 7.8 mg/dL — ABNORMAL LOW (ref 8.5–10.5)
Chloride: 99 mEq/L — ABNORMAL LOW (ref 100–111)
Chloride: 102 mEq/L (ref 100–111)
Creatinine: 0.7 mg/dL (ref 0.6–1.0)
Creatinine: 0.6 mg/dL (ref 0.6–1.0)
Globulin: 4 g/dL — ABNORMAL HIGH (ref 2.0–3.6)
Globulin: 3.5 g/dL (ref 2.0–3.6)
Glucose: 382 mg/dL — ABNORMAL HIGH (ref 70–100)
Glucose: 315 mg/dL — ABNORMAL HIGH (ref 70–100)
Potassium: 3.8 mEq/L (ref 3.5–5.1)
Potassium: 3.7 mEq/L (ref 3.5–5.1)
Protein, Total: 7.2 g/dL (ref 6.0–8.3)
Protein, Total: 6.4 g/dL (ref 6.0–8.3)
Sodium: 135 mEq/L — ABNORMAL LOW (ref 136–145)
Sodium: 135 mEq/L — ABNORMAL LOW (ref 136–145)

## 2019-02-08 LAB — GLUCOSE WHOLE BLOOD - POCT
Whole Blood Glucose POCT: 313 mg/dL — ABNORMAL HIGH (ref 70–100)
Whole Blood Glucose POCT: 261 mg/dL — ABNORMAL HIGH (ref 70–100)
Whole Blood Glucose POCT: 302 mg/dL — ABNORMAL HIGH (ref 70–100)
Whole Blood Glucose POCT: 125 mg/dL — ABNORMAL HIGH (ref 70–100)
Whole Blood Glucose POCT: 122 mg/dL — ABNORMAL HIGH (ref 70–100)
Whole Blood Glucose POCT: 121 mg/dL — ABNORMAL HIGH (ref 70–100)

## 2019-02-08 LAB — HCG QUANTITATIVE: hCG, Quant.: <1.2

## 2019-02-08 LAB — TROPONIN I
Troponin I: <0.01 ng/mL (ref 0.00–0.05)
Troponin I: <0.01 ng/mL (ref 0.00–0.05)
Troponin I: <0.01 ng/mL (ref 0.00–0.05)

## 2019-02-08 LAB — PT/INR
PT INR: 1 (ref 0.9–1.1)
PT: 13 s (ref 12.6–15.0)

## 2019-02-08 LAB — LIPID PANEL
Cholesterol / HDL Ratio: 6.4
Cholesterol: 159 mg/dL (ref 0–199)
HDL: 25 mg/dL — ABNORMAL LOW (ref 40–9999)
Triglycerides: 513 mg/dL — ABNORMAL HIGH (ref 34–149)

## 2019-02-08 LAB — URINE BHCG POC: Urine bHCG POC: NEGATIVE

## 2019-02-08 LAB — FERRITIN: Ferritin: 42.77 ng/mL (ref 4.60–204.00)

## 2019-02-08 LAB — MAGNESIUM: Magnesium: 1.4 mg/dL — ABNORMAL LOW (ref 1.6–2.6)

## 2019-02-08 LAB — COVID-19 (SARS-COV-2): SARS CoV 2 Overall Result: DETECTED — AB

## 2019-02-08 LAB — GFR
EGFR: >60
EGFR: >60

## 2019-02-08 LAB — TRIGLYCERIDES: Triglycerides: 515 mg/dL — ABNORMAL HIGH (ref 34–149)

## 2019-02-08 LAB — CK: Creatine Kinase (CK): 37 U/L (ref 29–168)

## 2019-02-08 LAB — APTT: PTT: 28 s (ref 23–37)

## 2019-02-08 LAB — LACTIC ACID, PLASMA: Lactic Acid: 1.3 mmol/L (ref 0.2–2.0)

## 2019-02-08 LAB — LIPASE: Lipase: 31 U/L (ref 8–78)

## 2019-02-08 LAB — C-REACTIVE PROTEIN: C-Reactive Protein: 1.7 mg/dL — ABNORMAL HIGH (ref 0.0–0.8)

## 2019-02-08 LAB — PROCALCITONIN: Procalcitonin: <0.02 (ref 0.00–0.10)

## 2019-02-08 LAB — FIBRINOGEN: Fibrinogen: 537 mg/dL — ABNORMAL HIGH (ref 189–458)

## 2019-02-08 MED ORDER — FAMOTIDINE 10 MG/ML IV SOLN (WRAP)
20.00 mg | Freq: Two times a day (BID) | INTRAVENOUS | Status: DC
Start: 2019-02-08 — End: 2019-02-08
  Administered 2019-02-08: 08:00:00 20 mg via INTRAVENOUS
  Filled 2019-02-08: qty 2

## 2019-02-08 MED ORDER — ASPIRIN 325 MG PO TABS
325.0000 mg | ORAL_TABLET | Freq: Once | ORAL | Status: AC
Start: 2019-02-08 — End: 2019-02-08
  Administered 2019-02-08: 08:00:00 325 mg via ORAL
  Filled 2019-02-08: qty 1

## 2019-02-08 MED ORDER — DEXTROSE 10 % IV BOLUS
125.00 mL | INTRAVENOUS | Status: DC | PRN
Start: 2019-02-08 — End: 2019-02-11

## 2019-02-08 MED ORDER — MORPHINE SULFATE 4 MG/ML IJ/IV SOLN (WRAP)
4.0000 mg | Freq: Once | Status: AC
Start: 2019-02-08 — End: 2019-02-08
  Administered 2019-02-08: 01:00:00 4 mg via INTRAVENOUS
  Filled 2019-02-08: qty 1

## 2019-02-08 MED ORDER — INSULIN LISPRO 100 UNIT/ML SC SOLN
1.00 [IU] | Freq: Three times a day (TID) | SUBCUTANEOUS | Status: DC
Start: 2019-02-08 — End: 2019-02-11
  Administered 2019-02-08: 09:00:00 4 [IU] via SUBCUTANEOUS
  Administered 2019-02-09 – 2019-02-10 (×2): 2 [IU] via SUBCUTANEOUS
  Administered 2019-02-10: 18:00:00 3 [IU] via SUBCUTANEOUS
  Administered 2019-02-10 – 2019-02-11 (×2): 2 [IU] via SUBCUTANEOUS
  Filled 2019-02-08 (×4): qty 6
  Filled 2019-02-08: qty 12
  Filled 2019-02-08: qty 9

## 2019-02-08 MED ORDER — ENOXAPARIN SODIUM 40 MG/0.4ML SC SOLN
40.00 mg | Freq: Every day | SUBCUTANEOUS | Status: DC
Start: 2019-02-08 — End: 2019-02-11
  Administered 2019-02-08 – 2019-02-11 (×4): 40 mg via SUBCUTANEOUS
  Filled 2019-02-08 (×4): qty 0.4

## 2019-02-08 MED ORDER — ONDANSETRON HCL 4 MG/2ML IJ SOLN
INTRAMUSCULAR | Status: AC
Start: 2019-02-08 — End: 2019-02-08
  Administered 2019-02-08: 06:00:00 4 mg via INTRAVENOUS
  Filled 2019-02-08: qty 2

## 2019-02-08 MED ORDER — KETOROLAC TROMETHAMINE 30 MG/ML IJ SOLN
15.00 mg | Freq: Once | INTRAMUSCULAR | Status: AC
Start: 2019-02-08 — End: 2019-02-08
  Administered 2019-02-08: 03:00:00 15 mg via INTRAVENOUS
  Filled 2019-02-08: qty 1

## 2019-02-08 MED ORDER — IOHEXOL 350 MG/ML IV SOLN
70.00 mL | Freq: Once | INTRAVENOUS | Status: AC | PRN
Start: 2019-02-08 — End: 2019-02-08
  Administered 2019-02-08: 70 mL via INTRAVENOUS

## 2019-02-08 MED ORDER — ONDANSETRON HCL 4 MG/2ML IJ SOLN
4.00 mg | Freq: Three times a day (TID) | INTRAMUSCULAR | Status: AC | PRN
Start: 2019-02-08 — End: 2019-02-09
  Administered 2019-02-08: 13:00:00 4 mg via INTRAVENOUS
  Filled 2019-02-08: qty 2

## 2019-02-08 MED ORDER — GLUCOSE 40 % PO GEL
15.00 g | ORAL | Status: DC | PRN
Start: 2019-02-08 — End: 2019-02-11

## 2019-02-08 MED ORDER — SODIUM CHLORIDE 0.9 % IV SOLN
INTRAVENOUS | Status: AC
Start: 2019-02-08 — End: 2019-02-10

## 2019-02-08 MED ORDER — FLUCONAZOLE 100 MG PO TABS
100.0000 mg | ORAL_TABLET | ORAL | Status: DC
Start: 2019-02-08 — End: 2019-02-08

## 2019-02-08 MED ORDER — GLUCAGON 1 MG IJ SOLR (WRAP)
1.00 mg | INTRAMUSCULAR | Status: DC | PRN
Start: 2019-02-08 — End: 2019-02-11

## 2019-02-08 MED ORDER — ACETAMINOPHEN 500 MG PO TABS
1000.0000 mg | ORAL_TABLET | Freq: Four times a day (QID) | ORAL | Status: DC | PRN
Start: 2019-02-08 — End: 2019-02-11
  Administered 2019-02-08 – 2019-02-10 (×4): 1000 mg via ORAL
  Filled 2019-02-08 (×4): qty 2

## 2019-02-08 MED ORDER — SODIUM CHLORIDE 0.9 % IV MBP
1.00 g | Freq: Once | INTRAVENOUS | Status: AC
Start: 2019-02-08 — End: 2019-02-08
  Administered 2019-02-08: 06:00:00 1 g via INTRAVENOUS
  Filled 2019-02-08: qty 1000

## 2019-02-08 MED ORDER — SODIUM CHLORIDE 0.9 % IV SOLN
500.00 mg | Freq: Once | INTRAVENOUS | Status: AC
Start: 2019-02-08 — End: 2019-02-08
  Administered 2019-02-08: 08:00:00 500 mg via INTRAVENOUS
  Filled 2019-02-08: qty 500

## 2019-02-08 MED ORDER — INSULIN LISPRO 100 UNIT/ML SC SOLN
1.00 [IU] | Freq: Every evening | SUBCUTANEOUS | Status: DC
Start: 2019-02-08 — End: 2019-02-11
  Administered 2019-02-09 – 2019-02-10 (×2): 2 [IU] via SUBCUTANEOUS
  Filled 2019-02-08 (×2): qty 6

## 2019-02-08 MED ORDER — FAMOTIDINE 20 MG PO TABS
20.0000 mg | ORAL_TABLET | Freq: Two times a day (BID) | ORAL | Status: DC
Start: 2019-02-08 — End: 2019-02-11
  Administered 2019-02-09 – 2019-02-11 (×4): 20 mg via ORAL
  Filled 2019-02-08 (×4): qty 1

## 2019-02-08 MED ORDER — SODIUM CHLORIDE 0.9 % IV BOLUS
500.00 mL | Freq: Once | INTRAVENOUS | Status: AC
Start: 2019-02-08 — End: 2019-02-08
  Administered 2019-02-08: 09:00:00 500 mL via INTRAVENOUS

## 2019-02-08 MED ORDER — LISINOPRIL 10 MG PO TABS
10.0000 mg | ORAL_TABLET | Freq: Every day | ORAL | Status: DC
Start: 2019-02-08 — End: 2019-02-11
  Administered 2019-02-08 – 2019-02-11 (×4): 10 mg via ORAL
  Filled 2019-02-08 (×4): qty 1

## 2019-02-08 MED ORDER — FAMOTIDINE 10 MG/ML IV SOLN (WRAP)
20.00 mg | Freq: Two times a day (BID) | INTRAVENOUS | Status: DC
Start: 2019-02-08 — End: 2019-02-11
  Administered 2019-02-08 – 2019-02-09 (×2): 20 mg via INTRAVENOUS
  Filled 2019-02-08 (×2): qty 2

## 2019-02-08 MED ORDER — ALUM & MAG HYDROXIDE-SIMETH 200-200-20 MG/5ML PO SUSP
30.00 mL | Freq: Once | ORAL | Status: AC
Start: 2019-02-08 — End: 2019-02-08
  Administered 2019-02-08: 04:00:00 30 mL via ORAL
  Filled 2019-02-08: qty 30

## 2019-02-08 MED ORDER — SODIUM CHLORIDE 0.9 % IV BOLUS
1000.00 mL | Freq: Once | INTRAVENOUS | Status: AC
Start: 2019-02-08 — End: 2019-02-08
  Administered 2019-02-08: 01:00:00 1000 mL via INTRAVENOUS

## 2019-02-08 MED ORDER — LIDOCAINE VISCOUS HCL 2 % MT SOLN
10.00 mL | Freq: Once | OROMUCOSAL | Status: AC
Start: 2019-02-08 — End: 2019-02-08
  Administered 2019-02-08: 04:00:00 10 mL via OROMUCOSAL
  Filled 2019-02-08: qty 15

## 2019-02-08 MED ORDER — KETOROLAC TROMETHAMINE 30 MG/ML IJ SOLN
30.00 mg | Freq: Once | INTRAMUSCULAR | Status: AC
Start: 2019-02-08 — End: 2019-02-08
  Administered 2019-02-08: 04:00:00 30 mg via INTRAVENOUS
  Filled 2019-02-08: qty 1

## 2019-02-08 MED ORDER — FLUCONAZOLE 100 MG PO TABS
100.0000 mg | ORAL_TABLET | ORAL | Status: DC
Start: 2019-02-08 — End: 2019-02-11
  Administered 2019-02-08 – 2019-02-11 (×4): 100 mg via ORAL
  Filled 2019-02-08 (×5): qty 1

## 2019-02-08 MED ORDER — MORPHINE SULFATE 2 MG/ML IJ/IV SOLN (WRAP)
2.0000 mg | Freq: Once | Status: AC
Start: 2019-02-08 — End: 2019-02-08
  Administered 2019-02-08: 06:00:00 2 mg via INTRAVENOUS
  Filled 2019-02-08: qty 1

## 2019-02-08 NOTE — Progress Notes (Signed)
Patient admitted earlier today    Please see H&P for details    Uncontrolled Hypertension  Uncontrolled DM  Right sided Pneumonia      Continue with management

## 2019-02-08 NOTE — ED Notes (Signed)
Bed: GR11  Expected date:   Expected time:   Means of arrival:   Comments:

## 2019-02-08 NOTE — H&P (Signed)
[facility]      Patient: Kelsey Miles  Date: 02/08/2019   DOB: 1981/03/30  Admission Date: 02/08/2019   MRN: [id number]  Attending: Marya Amsler         Cc:  1. Abdominal pain  2. CP     History Gathered From: Patient and ED PA.  Also reviewed hospital note dated on 01/2018.    HISTORY AND PHYSICAL     Kelsey Miles is a 38 y.o. female with medical history of uncontrolled diabetes,  hypertension and hyperlipidemia who presented with abdominal pain and chest pain.    Patient reports around 9 PM she suddenly developed headache followed by chest pain and abdominal pain, associated with nausea, vomiting and palpitation.  Patient checked her blood glucose and found 450.  She also checks her blood pressure and found systolic blood pressure 195.  Her brother gave his blood pressure medication, patient does not know name of the medication.  Patient denies fever, chills, cold/flulike symptoms, sick contact with COVID, cough, shortness of breath, change in urinary/bowel habit.    Past Medical History:   Diagnosis Date    Diabetes     Gout     Hypertension     UTI (urinary tract infection)        Past Surgical History:   Procedure Laterality Date    CESAREAN SECTION  x2    CESAREAN SECTION         Prior to Admission medications    Medication Sig Start Date End Date Taking? Authorizing Provider   fenofibrate (TRICOR) 145 MG tablet Take 1 tablet (145 mg total) by mouth daily 06/01/18 06/01/19  Lianne Bushy, MD   insulin NPH-regular 70-30 MIXTURE (NOVOLIN 70/30) (70-30) 100 UNIT/ML injection Inject 21 Units into the skin 2 (two) times daily before meals 08/25/18   Tommy Medal, PA   Insulin Syringe-Needle U-100 (INSULIN SYRINGE .5CC/31GX5/16") 31G X 5/16" 0.5 ML Misc Use 2x a day 05/30/18   Lianne Bushy, MD   metFORMIN (GLUCOPHAGE) 1000 MG tablet Take 1 tablet (1,000 mg total) by mouth 2 (two) times daily with meals 05/30/18   Lianne Bushy, MD   ondansetron (ZOFRAN-ODT) 4 MG  disintegrating tablet Take 1 tablet (4 mg total) by mouth every 6 (six) hours as needed for Nausea 08/14/18   Tenny Craw, MD   vitamin D, ergocalciferol, (DRISDOL) 50000 UNIT Cap Take 1 capsule (50,000 Units total) by mouth once a week 05/30/18   Lianne Bushy, MD       No Known Allergies    CODE STATUS: Full code.     PRIMARY CARE MD: Sherol Dade, MD    Family History   Problem Relation Age of Onset    Diabetes, CAD Father     Diabetes Brother     Diabetes Sister        Social History     Tobacco Use    Smoking status: Never Smoker    Smokeless tobacco: Never Used   Substance Use Topics    Alcohol use: No    Drug use: No       REVIEW OF SYSTEMS   Positive for: As in HPI  Negative for: As in HPI  All ROS completed and otherwise negative.    PHYSICAL EXAM     Vital Signs (most recent): BP (!) 158/104    Pulse 93    Temp 98.2 F (36.8 C)    Resp 18  SpO2 100%   Constitutional: NAD but seems lethargic.   HEENT: NC/AT,  no scleral icterus or conjunctival pallor, no nasal discharge, MM dry.  Neck: trachea midline, supple, no cervical or supraclavicular lymphadenopathy or masses.  Cardiovascular: RRR, normal S1 S2, no murmurs, gallops, palpable thrills, no JVD, no legs edema.  Respiratory: Normal rate. No retractions or increased work of breathing. Clear to auscultation bilaterally.  Gastrointestinal: +BS, non-distended, soft, Mild epigastric tenderness, no rebound or guarding.  Genitourinary: no suprapubic or costovertebral angle tenderness.  Musculoskeletal: ROM and motor strength grossly normal.  Skin exam:  Normal.  Neurologic: No gross motor or sensory deficits  Psychiatric: AAOx3, affect and mood appropriate. The patient is alert, interactive, appropriate.  Capillary refill:  Normal.     Exam done by Marya Amsler, MD on 02/08/19 at 5:37 AM.    LABS & IMAGING     Recent Results (from the past 24 hour(s))   CBC and differential    Collection Time: 02/08/19 12:58 AM   Result Value Ref Range     WBC 11.36 (H) 3.10 - 9.50 x10 3/uL    Hgb 10.5 (L) 11.4 - 14.8 g/dL    Hematocrit 16.1 (L) 34.7 - 43.7 %    Platelets 461 (H) 142 - 346 x10 3/uL    RBC 4.33 3.90 - 5.10 x10 6/uL    MCV 73.9 (L) 78.0 - 96.0 fL    MCH 24.2 (L) 25.1 - 33.5 pg    MCHC 32.8 31.5 - 35.8 g/dL    RDW 17 (H) 11 - 15 %    MPV 9.2 8.9 - 12.5 fL    Neutrophils 71.9 None %    Lymphocytes Automated 19.4 None %    Monocytes 4.8 None %    Eosinophils Automated 3.0 None %    Basophils Automated 0.4 None %    Immature Granulocyte 0.5 None %    Nucleated RBC 0.0 0.0 - 0.0 /100 WBC    Neutrophils Absolute 8.17 (H) 1.10 - 6.33 x10 3/uL    Abs Lymph Automated 2.20 0.42 - 3.22 x10 3/uL    Abs Mono Automated 0.55 0.21 - 0.85 x10 3/uL    Abs Eos Automated 0.34 0.00 - 0.44 x10 3/uL    Absolute Baso Automated 0.04 0.00 - 0.08 x10 3/uL    Absolute Immature Granulocyte 0.06 0.00 - 0.07 x10 3/uL    Absolute NRBC 0.00 0.00 - 0.00 x10 3/uL   Comprehensive metabolic panel    Collection Time: 02/08/19 12:58 AM   Result Value Ref Range    Glucose 382 (H) 70 - 100 mg/dL    BUN 09.6 7.0 - 04.5 mg/dL    Creatinine 0.7 0.6 - 1.0 mg/dL    Sodium 409 (L) 811 - 145 mEq/L    Potassium 3.8 3.5 - 5.1 mEq/L    Chloride 99 (L) 100 - 111 mEq/L    CO2 24 22 - 29 mEq/L    Calcium 9.1 8.5 - 10.5 mg/dL    Protein, Total 7.2 6.0 - 8.3 g/dL    Albumin 3.2 (L) 3.5 - 5.0 g/dL    AST (SGOT) 17 5 - 34 U/L    ALT 16 0 - 55 U/L    Alkaline Phosphatase 144 (H) 37 - 106 U/L    Bilirubin, Total 0.4 0.2 - 1.2 mg/dL    Globulin 4.0 (H) 2.0 - 3.6 g/dL    Albumin/Globulin Ratio 0.8 (L) 0.9 - 2.2    Anion Gap 12.0 5.0 -  15.0   Troponin I    Collection Time: 02/08/19 12:58 AM   Result Value Ref Range    Troponin I <0.01 0.00 - 0.05 ng/mL   Hemolysis index    Collection Time: 02/08/19 12:58 AM   Result Value Ref Range    Hemolysis Index 15 0 - 18   GFR    Collection Time: 02/08/19 12:58 AM   Result Value Ref Range    EGFR >60.0    D-Dimer    Collection Time: 02/08/19 12:58 AM   Result Value Ref Range     D-Dimer 0.55 (H) 0.00 - 0.50 ug/mL FEU   Beta HCG, Quant, Serum    Collection Time: 02/08/19 12:58 AM   Result Value Ref Range    hCG, Quant. <1.2 See below   Lipase    Collection Time: 02/08/19 12:58 AM   Result Value Ref Range    Lipase 31 8 - 78 U/L   Urine BHCG POC    Collection Time: 02/08/19  1:00 AM   Result Value Ref Range    Urine bHCG POC Negative Negative   Glucose Whole Blood - POCT    Collection Time: 02/08/19  1:06 AM   Result Value Ref Range    POCT - Glucose Whole blood 313 (H) 70 - 100 mg/dL   UA, Reflex to Microscopic (pts 3 + yrs)    Collection Time: 02/08/19  1:09 AM   Result Value Ref Range    Urine Type Clean Catch     Color, UA Yellow Colorless - Yellow    Clarity, UA Clear Clear - Hazy    Specific Gravity UA 1.017 1.001 - 1.035    Urine pH 8.0 5.0 - 8.0    Leukocyte Esterase, UA Negative Negative    Nitrite, UA Negative Negative    Protein, UR 100 (A) Negative    Glucose, UA 500 (A) Negative    Ketones UA Trace (A) Negative    Urobilinogen, UA Negative 0.2 - 2.0 mg/dL    Bilirubin, UA Negative Negative    Blood, UA Moderate (A) Negative    RBC, UA 6 - 10 (A) 0 - 5 /hpf    WBC, UA 11 - 25 (A) 0 - 5 /hpf    Squamous Epithelial Cells, Urine 0 - 5 0 - 25 /hpf    Yeast, UA Many (A) None   Glucose Whole Blood - POCT    Collection Time: 02/08/19  5:18 AM   Result Value Ref Range    POCT - Glucose Whole blood 261 (H) 70 - 100 mg/dL       MICROBIOLOGY:  Blood Culture: P  Urine Culture: P  Antibiotics Started: Y    IMAGING:  Upon my review:   1. CXR; Negative for acute abnormality.  2. CTA chest: R side PNA. NO PE.  3. US abdomen: No acute abnormality.    CARDIAC:  EKG Interpretation (upon my review):  NSR. No ST elevation.    Markers:  Recent Labs   Lab 02/08/19  0058   Troponin I <0.01       EMERGENCY DEPARTMENT COURSE:  Orders Placed This Encounter   Procedures    Urine culture    Blood Culture Aerobic/Anaerobic #1    Blood Culture Aerobic/Anaerobic #2    COVID-19 (SARS-CoV-2) ([place])    XR  Chest  AP Portable    CT Angio Chest (PE study)    US Abdomen Limited RUQ    CBC and differential  Comprehensive metabolic panel    Troponin I    Hemolysis index    GFR    D-Dimer    Beta HCG, Quant, Serum    UA, Reflex to Microscopic (pts 3 + yrs)    Lipase    Urine BHCG POC    Lipase    CBC and differential    Comprehensive metabolic panel    Magnesium    Prothrombin time/INR    APTT    Ferritin    C Reactive Protein    Lactate dehydrogenase    Triglycerides    Creatine Kinase (CK)    Troponin I    Lactic acid, plasma    D-Dimer    Fibrinogen    Procalcitonin    Hemolysis index    Hemolysis index    GFR    Urine BHGC POCT    Cardiac monitoring (ED ONLY)    Glucose POC    Glucose POC    Telemetry Monitoring Continuous    Vital signs    Pulse Oximetry    Notify Physician Immediately for Respiratory Status Changes    Notify Physician (Vital Signs)    Eye protection shoud be worn for all instances of  patient contact    Nursing communication    Nasal Cannula Low-Flow (5 LPM or less)    Place sequential compression device    Maintain sequential compression device    Patient education COVID-19    Full Code    ED Unit Sec Comm Order    Isolation-Contact    Isolation-Droplet    Contact isolation    Droplet isolation    Glucose Whole Blood - POCT    Glucose Whole Blood - POCT    ECG 12 lead    ECG 12 lead    Saline lock IV    Saline lock IV    Place (admit) for Observation Services    [facility] ED Bed Request (Observation)       ASSESSMENT & PLAN     Kameisha Malicki Kerrie Timm is a 39 y.o. female with medical history of uncontrolled diabetes,  hypertension and hyperlipidemia admitted with Hyperglycemia.     Patient Active Hospital Problem List: 02/08/19    1. Hyperglycemia: BG 328. At home was in 400s. Seems uncontrolled DM. Last A1C was 14.3 on 05/2018. Patient reports that she is complaint with diabetic regimen.  2. Nausea, vomiting and abdominal pain: likely due to  hyperglycemia  3. Abnormal UA: + for proteinuria, glucosuria and ketone, also few WBC. Patient denies urinary symptoms.  4. CP: ? Due to HTN  5. HA: seems due to HTN and hyperglycemia  6. HTN: BP 163/104. Elevated reading at home.  7. HLD  8. PNA on CTA chest    -Reviewed CXR; Negative for acute abnormality.  -CTA chest: R side PNA. No PE.  -Reviewed EKG; NSR. No ST elevation.  -US abdomen: No acute abnormality.    -Will give IVF. Check A1C. Starting CSI with hypoglycemia protocol. NPO for now.  -Zofran IV prn.  -Starting Lisinopril for renal protection as well as for BP control.   -Lipase, AST/ALT unremarkable. Mild elevated Alk phos 144.    -Telemetry monitoring.  -Will trend troponin.  -Starting Famotidine IV.  -Pain management.  -Starting Lisinopril   -Lipid profile and A1C.    -FU BC.   -COVID sent by ED. Patient has no resp symptoms or fever. CT chest shows R side PNA. Ceftriaxone and Azithromycin given in ED. Droplet and  contact precautions.    Nutrition  NPO.    DVT/VTE Prophylaxis  SCD/Lovenox.    Anticipated medical stability for discharge: 1-2 days.    Service status/Reason for ongoing hospitalization: Observation/hyperglycemia  Anticipated Discharge Needs: To be determined.    Signed,  Marya Amsler    Time Elapsed: 45 min.

## 2019-02-08 NOTE — Progress Notes (Signed)
Pt is A&OX4. Ambulatory. COVID Test result is pending. Droplet/contact precaution in placed and donned proper PPE with each visits. Pt is admitted with headache, abdominal pain, N/V. Orient patient to room, use of call light and surrounding.  Admission documentation and assessment completed. Pt is NPO. On IVF NS at 100 mL/hr. Monitored BG and coverage given as indicated. Pt continues to have intermittent N/V. PRN Zofran given with relief. Tylenol for headache. VSS. No acute distress noted. Safety measures in place.

## 2019-02-08 NOTE — ED Notes (Signed)
Bed: GR14  Expected date:   Expected time:   Means of arrival:   Comments:

## 2019-02-08 NOTE — EDIE (Signed)
COLLECTIVE?NOTIFICATION?02/08/2019 00:36?Fahima, Cifelli M?MRN: [id number]    Criteria Met      5 ED Visits in 12 Months    Security and Safety  No recent Security Events currently on file    ED Care Guidelines  There are currently no ED Care Guidelines for this patient. Please check your facility's medical records system.        Prescription Monitoring Program  PDMP query found no report.  Narx Score not available at this time.      E.D. Visit Count (12 mo.)  Facility Visits   [place] [facility] 2   [facility] 3   Total 5   Note: Visits indicate total known visits.      Recent Emergency Department Visit Summary  Date Facility [facility] Type Diagnoses or Chief Complaint   Feb 08, 2019 [place] [facility]      CP      Aug 24, 2018 [place] [facility] [place] [facility]      medic - hyperglycemia      Hyperglycemia      Generalized weakness      Hyperglycemia, unspecified      Other viral agents as the cause of diseases classified elsewh      Type 2 diabetes mellitus with unspecified complications      Acute upper respiratory infection, unspecified      Encounter for issue of repeat prescription      Aug 14, 2018 [place] [facility] [place] [facility]      abd \\T \ flank pain      Emesis      Cough      Flank Pain      Abdominal Pain      Nausea with vomiting, unspecified      Bronchitis, not specified as acute or chronic      May 21, 2018 [place] [facility] [place] 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      Medic 410-UTI/flank pain      Flank Pain      Cystitis, unspecified without hematuria          Recent Inpatient Visit Summary  No recorded inpatient visits.     Care Team  There is not a care team on record at this time.   Collective Portal  This patient has registered at the [facility] Department   For more information visit: [email or web address]     PLEASE NOTE:     1.   Any care recommendations and other clinical information are provided as guidelines or for historical purposes only, and providers should exercise their own clinical judgment when providing care.    2.   You may only use this information for purposes of treatment, payment or health care operations activities, and subject to the limitations of applicable Collective Policies.    3.   You should consult directly with the organization that provided a care guideline or other clinical history with any questions about additional information or accuracy or completeness of information provided.    ? 2020 [place], [employer] - [email or web address]

## 2019-02-08 NOTE — ED Provider Notes (Signed)
EMERGENCY DEPARTMENT HISTORY AND PHYSICAL EXAM     Physician/Midlevel provider first contact with patient: 02/08/19 0041         Date: 02/08/2019  Patient Name: Kelsey Miles  Attending Physician: Jethro Bastos, MD  Advanced Practice Provider: Lynita Lombard    History of Presenting Illness       History Provided By: Patient and EMS    Chief Complaint:  Chief Complaint   Patient presents with    Abdominal Pain    Chest Pain    Emesis     Onset: pta  Timing: acute  Location: chest   Quality: pain n/v  Severity:severe  Exacerbating factors: n/a  Alleviating factors: n/a  Associated Symptoms: see ROS  Pertinent Negatives: see ROS    Additional History: Kelsey Miles is a 38 y.o. female presenting to the ED with hypertension hyperlipidemia diabetes presents with abdominal pain and chest pain.  Patient states it started after she had some beans that were not fried for dinner as well as some rice.  She vomited and then she started having some chest pain.  She denies any cough or respiratory symptoms.  No known coronavirus exposure.  No fevers or chills  She took 30 units of Lantus before coming    PCP: Sherol Dade, MD  SPECIALISTS:     Avaeh, Ewer   Home Medication Instructions ZOX:[id number]    Printed on:02/08/19 0546   Medication Information 08 AM 10 AM 12 Noon 06 PM 08 PM 10 PM Bed time             fenofibrate (TRICOR) 145 MG tablet  Take 1 tablet (145 mg total) by mouth daily            insulin NPH-regular 70-30 MIXTURE (NOVOLIN 70/30) (70-30) 100 UNIT/ML injection  Inject 21 Units into the skin 2 (two) times daily before meals            Insulin Syringe-Needle U-100 (INSULIN SYRINGE .5CC/31GX5/16") 31G X 5/16" 0.5 ML Misc  Use 2x a day            metFORMIN (GLUCOPHAGE) 1000 MG tablet  Take 1 tablet (1,000 mg total) by mouth 2 (two) times daily with meals            ondansetron (ZOFRAN-ODT) 4 MG disintegrating tablet  Take 1 tablet (4 mg total) by mouth every 6 (six) hours as needed for  Nausea            vitamin D, ergocalciferol, (DRISDOL) 50000 UNIT Cap  Take 1 capsule (50,000 Units total) by mouth once a week                Past History     Past Medical History:  Past Medical History:   Diagnosis Date    Diabetes     Gout     Hypertension     UTI (urinary tract infection)        Past Surgical History:  Past Surgical History:   Procedure Laterality Date    CESAREAN SECTION  x2    CESAREAN SECTION         Family History:  Family History   Problem Relation Age of Onset    Diabetes Father     Diabetes Brother     Diabetes Sister        Social History:  Social History     Socioeconomic History    Marital status: Married  Spouse name: None    Number of children: None    Years of education: None    Highest education level: None   Occupational History    None   Social Engineer, site strain: None    Food insecurity:     Worry: None     Inability: None    Transportation needs:     Medical: None     Non-medical: None   Tobacco Use    Smoking status: Never Smoker    Smokeless tobacco: Never Used   Substance and Sexual Activity    Alcohol use: No    Drug use: No    Sexual activity: Yes     Partners: Male   Lifestyle    Physical activity:     Days per week: None     Minutes per session: None    Stress: None   Relationships    Social connections:     Talks on phone: None     Gets together: None     Attends religious service: None     Active member of club or organization: None     Attends meetings of clubs or organizations: None     Relationship status: None    Intimate partner violence:     Fear of current or ex partner: None     Emotionally abused: None     Physically abused: None     Forced sexual activity: None   Other Topics Concern    None   Social History Narrative    ** Merged History Encounter **            Allergies:  No Known Allergies    Review of Systems     General: No fever, no sweats, no chills.   HENT: No headache, no nasal congestion, no sore  throat  Respiratory: No cough, no shortness of breath.  Cardiovascular: Positive chest pain, no calf pain or swelling.   Gastrointestinal: Positive abdominal pain, positive nausea, positive vomiting, no diarrhea.   Genito-Urinary: No dysuria, no frequency, no hematuria  Musculoskeletal: No myalgias, no joint pains.   Neurological: No new focal weakness, no sensory changes.   Dermatological: No new rashes, no color changes.   Psychological: No acute mood changes, no confusion.     Review of Systems    Physical Exam     Vitals:    02/08/19 0100 02/08/19 0200 02/08/19 0500 02/08/19 0530   BP: 122/81 150/89 (!) 163/104 (!) 158/104   Pulse: 86 88 89 93   Resp: (!) 10 18     Temp: 98.2 F (36.8 C)      SpO2: 96% 99% 100% 100%         Constitutional: Vital signs reviewed.  Fatigued nontoxic  Head: Normocephalic, atraumatic. No external trauma noted.  Eyes: Conjunctiva and sclera are normal. Extraocular movements intact, pupils equal, round, reactive.  Ear, Nose, Throat:  Normal external examination of the nose and ears. Oropharynx clear, moist mucous membranes.    Neck: Supple.    Respiratory/Chest: Clear to auscultation. No respiratory distress. No wheezing, rhonchi or rales.  Cardiovascular: Tachycardic S1, S2.   Abdomen: Normoactive Bowel sounds.  Positive epigastric tenderness soft no guarding rebound back: No midline tenderness, no CVA tenderness.   Extremities: Upper and lower extremities with no cyanosis or edema. No calf tenderness. Normal +2 pulses in all extremities.  Skin: Warm and dry. No rash.  Neuro: alert and appropriate, normal speech, no  facial droop, moving all extremities.       Physical Exam      Diagnostic Study Results     Labs -     Results     Procedure Component Value Units Date/Time    COVID-19 (SARS-CoV-2) [phone]) [[id number]] Collected:  02/08/19 0543    Specimen:  Nasopharyngeal Swab from Nasopharynx Updated:  02/08/19 0543    Narrative:       o Collect and clearly label specimen type:  o  PREFERRED-Upper respiratory specimen: One Nasopharyngeal  Swab in Transport Media.  o Hand deliver to laboratory ASAP  Lower respiratory specimen not acceptable    Glucose Whole Blood - POCT [[id number]]  (Abnormal) Collected:  02/08/19 0518     Updated:  02/08/19 0521     POCT - Glucose Whole blood 261 mg/dL     Blood Culture Aerobic/Anaerobic #1 [[id number]] Collected:  02/08/19 0520    Specimen:  Arm from Blood, Venipuncture Updated:  02/08/19 0521    Narrative:       1 BLUE+1 PURPLE    Blood Culture Aerobic/Anaerobic #2 [[id number]] Collected:  02/08/19 0520    Specimen:  Arm from Blood, Venipuncture Updated:  02/08/19 0521    Narrative:       1 BLUE+1 PURPLE    UA, Reflex to Microscopic (pts 3 + yrs) [[id number]]  (Abnormal) Collected:  02/08/19 0109    Specimen:  Urine Updated:  02/08/19 0158     Urine Type Clean Catch     Color, UA Yellow     Clarity, UA Clear     Specific Gravity UA 1.017     Urine pH 8.0     Leukocyte Esterase, UA Negative     Nitrite, UA Negative     Protein, UR 100     Glucose, UA 500     Ketones UA Trace     Urobilinogen, UA Negative mg/dL      Bilirubin, UA Negative     Blood, UA Moderate     RBC, UA 6 - 10 /hpf      WBC, UA 11 - 25 /hpf      Squamous Epithelial Cells, Urine 0 - 5 /hpf      Yeast, UA Many    Beta HCG, Quant, Serum [[id number]] Collected:  02/08/19 0058     Updated:  02/08/19 0143     hCG, Quant. <1.2    Troponin I [[id number]] Collected:  02/08/19 0058    Specimen:  Blood Updated:  02/08/19 0143     Troponin I <0.01 ng/mL     Lipase [[id number]] Collected:  02/08/19 0058     Updated:  02/08/19 0136     Lipase 31 U/L     Comprehensive metabolic panel [[id number]]  (Abnormal) Collected:  02/08/19 0058    Specimen:  Blood Updated:  02/08/19 0136     Glucose 382 mg/dL      BUN 32.2 mg/dL      Creatinine 0.7 mg/dL      Sodium 025 mEq/L      Potassium 3.8 mEq/L      Chloride 99 mEq/L      CO2 24 mEq/L      Calcium 9.1 mg/dL      Protein, Total 7.2 g/dL      Albumin 3.2 g/dL      AST  (SGOT) 17 U/L      ALT 16 U/L      Alkaline Phosphatase 144 U/L  Bilirubin, Total 0.4 mg/dL      Globulin 4.0 g/dL      Albumin/Globulin Ratio 0.8     Anion Gap 12.0    Hemolysis index [[id number]] Collected:  02/08/19 0058     Updated:  02/08/19 0136     Hemolysis Index 15    GFR [[id number]] Collected:  02/08/19 0058     Updated:  02/08/19 0136     EGFR >60.0    D-Dimer [[id number]]  (Abnormal) Collected:  02/08/19 0058     Updated:  02/08/19 0131     D-Dimer 0.55 ug/mL FEU     CBC and differential [[id number]]  (Abnormal) Collected:  02/08/19 0058    Specimen:  Blood Updated:  02/08/19 0119     WBC 11.36 x10 3/uL      Hgb 10.5 g/dL      Hematocrit 62.9 %      Platelets 461 x10 3/uL      RBC 4.33 x10 6/uL      MCV 73.9 fL      MCH 24.2 pg      MCHC 32.8 g/dL      RDW 17 %      MPV 9.2 fL      Neutrophils 71.9 %      Lymphocytes Automated 19.4 %      Monocytes 4.8 %      Eosinophils Automated 3.0 %      Basophils Automated 0.4 %      Immature Granulocyte 0.5 %      Nucleated RBC 0.0 /100 WBC      Neutrophils Absolute 8.17 x10 3/uL      Abs Lymph Automated 2.20 x10 3/uL      Abs Mono Automated 0.55 x10 3/uL      Abs Eos Automated 0.34 x10 3/uL      Absolute Baso Automated 0.04 x10 3/uL      Absolute Immature Granulocyte 0.06 x10 3/uL      Absolute NRBC 0.00 x10 3/uL     Glucose Whole Blood - POCT [[id number]]  (Abnormal) Collected:  02/08/19 0106     Updated:  02/08/19 0117     POCT - Glucose Whole blood 313 mg/dL     Urine culture [[id number]] Collected:  02/08/19 0109    Specimen:  Urine, Clean Catch Updated:  02/08/19 0110    Narrative:       Replace urinary catheter prior to obtaining the urine culture  if it has been in place for greater than or equal to 14  days:->N/A No Foley  Indications for Urine Culture:->Suprapubic Pain/Tenderness or  Dysuria    Lipase [[id number]] Collected:  02/08/19 0109    Specimen:  Blood Updated:  02/08/19 0109    Urine BHCG POC [[id number]] Collected:  02/08/19 0100     Updated:   02/08/19 0105     Urine bHCG POC Negative          Radiologic Studies -   Radiology Results (24 Hour)     Procedure Component Value Units Date/Time    US Abdomen Limited RUQ [[id number]] Collected:  02/08/19 0449    Order Status:  Completed Updated:  02/08/19 0457    Narrative:       CLINICAL HISTORY:  ABDOMINAL PAIN    EXAMINATION:  US ABDOMEN LIMITED RUQ    COMPARISON:  Abdominal ultrasound from 01-19-16. CTA chest from today     TECHNIQUE:  Grayscale evaluation of gallbladder    FINDINGS:  The pancreas, abdominal aorta, and IVC are poorly visualized.    Liver: normal in contour and     echotexture.     Gallbladder and biliary tree: Within normal limits. The gallbladder wall  measures 2 mm. The common bile duct measures 3 mm in diameter. The  technologist reported a negative sonographic Murphy's sign.    There is no significant free fluid.        Impression:         No significant abnormality identified.    Nicki Reaper, MD   02/08/2019 4:53 AM    CT Angio Chest (PE study) [[id number]] Collected:  02/08/19 0407    Order Status:  Completed Updated:  02/08/19 0416    Narrative:       CLINICAL HISTORY:  Elevated d-dimer    EXAMINATION:  CT ANGIOGRAM CHEST    COMPARISON:  none     TECHNIQUE:      contiguous axial CTA images of the chest are obtained.    Multiplanar re-formation, maximum intensity projection, sagittal,  coronal and/or volumetric reconstruction are performed on a separate  workstation for the purposes of CT pulmonary angiography.   A radiation dose optimization technique is used for this scan.    CONTRAST:  100 mL of Omnipaque 350    FINDINGS:    Lungs: Small amount of peripheral airspace opacities in right lung,  predominantly posterior aspect of lower lobe.    Vessels: No evidence pulmonary embolism, thoracic aneurysm or  dissection.     Mediastinum: Within normal limits.    Lymph nodes: Within normal limits.      Bones: Within normal limits.    Subcutaneous soft tissues:  Within normal limits.      Visualized thyroid gland: Within normal limits     Visualized abdominal viscera: Within normal limits.              Impression:           No pulmonary embolism.    Right pneumonia    Nicki Reaper, MD   02/08/2019 4:12 AM    XR Chest  AP Portable [[id number]] Collected:  02/08/19 0157    Order Status:  Completed Updated:  02/08/19 0202    Narrative:       Indication: Chest pain    Comparison: 08/25/2018    Technique: XR CHEST AP PORTABLE    Lines and tubes: None    Lungs: Normal lung volume. No airspace opacification identified. A  calcified granuloma is again seen at the left lower lung.    Pleura: No pleural fluid or pneumothorax.    Cardiac structures, hilar regions, and mediastinum: Normal.    Trachea and bronchial structures:  Midline position of the trachea. No  bronchiectasis identified.    Osseous structures: Normal    Overlying soft tissues: Normal    Upper abdomen: Visualized portions of the upper abdomen appear normal.      Impression:        No acute cardiopulmonary changes are seen.    Vella Raring, MD   02/08/2019 1:58 AM      .    Medical Decision Making   I am the first provider for this patient.    I reviewed the vital signs, available nursing notes, past medical history, past surgical history, family history and social history.    Vital Signs-Reviewed the patient's vital signs.     Patient Vitals for the past 12 hrs:   BP Temp Pulse  Resp   02/08/19 0530 (!) 158/104 -- 93 --   02/08/19 0500 (!) 163/104 -- 89 --   02/08/19 0200 150/89 -- 88 18   02/08/19 0100 122/81 98.2 F (36.8 C) 86 (!) 10       Pulse Oximetry Analysis - Normal 100% on RA       Procedures:   Procedures      EKG:  Time interpreted 0045 normal sinus rhythm rate of 88 left-sided axis essentially unchanged with prior poor R wave progression no acute ischemic changes     Core Measures:   Given aspirin    Admit Decision Time:  The decision to admit this patient was made by the emergency provider at 521[time] on 02/08/2019     Clinical  Decision Support:   Heart Score      Value   History  1   EKG  1   Risk Factors  2   Total (with age)  4   Onset of pain (time of START of last episode of chest pain)?  >6 hrs ago          Old Medical Records: Old medical records.  Previous electrocardiograms.     ED Course:   ED Course as of Feb 07 545   Thu Feb 08, 2019   0514 C/o cp will remedicate admit    [AD]   0521 Dr Greig Castilla accepts full tele obs requests covid test    [AD]      ED Course User Index  [AD] Lynita Lombard, PA       Provider Notes:    38 y.o. female with history of hypertension diabetes hyperlipidemia presents with chest pain and epigastric discomfort.  Patient noted to be tachycardic on my exam therefore d-dimer done and elevated.  CT angiogram is negative for PE however does show a right pneumonia.  Given empiric antibiotics Rocephin and azithromycin blood cultures done.  Patient to be admitted for rule out coronavirus    Do not suspect GI etiology given normal LFTs and negative sonogram    Patient continues to report ongoing chest pain in the ED therefore will admit for further management serial enzymes rule out ACS given her multiple medical comorbidities    ddx- acs, pna, ptx, chf, covid    D/w Jethro Bastos, MD          Discharge Prescriptions     None            Diagnosis     Clinical Impression:   1. Community acquired pneumonia of right lung, unspecified part of lung    2. Atypical chest pain    3. Nausea and vomiting, intractability of vomiting not specified, unspecified vomiting type        Treatment Plan:   ED Disposition     ED Disposition Condition Date/Time Comment    Observation  Thu Feb 08, 2019  5:22 AM Admitting Physician: Marya Amsler [[id number]]   Diagnosis: Suspected Covid-19 Virus Infection [[id number]]   Estimated Length of Stay: < 2 midnights   Tentative Discharge Plan?: Home or Self Care [1]   Patient Class: Observation [104]              _______________________________    CHART OWNERSHIPNigel Sloop, PA-C,  am the primary clinician of record.  _______________________________       Lynita Lombard, PA  02/08/19 6213       Jethro Bastos, MD  02/13/19 2317

## 2019-02-08 NOTE — UM Notes (Signed)
UTILIZATION REVIEW CONTACT: Name: Elfredia Nevins BSN, ACM  Clinical Case Manager  - Utilization Review  [facility]  Address:  [address] [place] [address]  NPI:  [id number]  Tax ID:  [id number]  Phone: [phone]  Fax: [phone]  Email: [email or web address]        PATIENT NAME: Kelsey Miles,[name]  DOB: 05-Dec-1980  PMH:  has a past medical history of Diabetes, Gout, Hypertension, and UTI (urinary tract infection).  PSH:  has a past surgical history that includes Cesarean section (x2) and Cesarean section.     ADMISSION REVIEW   History of present illness: Pt is a 38 y.o. female arrived at [facility] on 02/08/2019 at 0040.and admitted to med surg floor     Arrival VS: Vitals BP: 122/81, Temp: 98.2 F (36.8 C), Temp Source: Oral, Heart Rate: 86, Resp Rate: (!) 10, SpO2: 96 %       Abnormal Labs: WBC 11.36, H&H 10.5/32.0, Platelets 461. Glucose 382, Na 135, Ch 99, albumin 3.2, alk phos 144. D-dimer 0.55. Blood cultures, COVID-19 pending.      Urine:  100 protein, 500 glucose, trace ketones, moderate blood, 6-10 RBC, 11-25 WBC, many yeast. Urine culture pending.      Diagnostics:   CT Angio Chest (PE study) (Final result)   Right pneumonia       Medications in ED:  Iv ns, iv morphine x2, iv toradol x2, xylocaine, maalox, iv rocephin, iv zofran     Admit to    02/08/19 0522  Place (admit) for Observation Services (Adult Observation Admit Panel (AX))        Dx: Suspected Covid-19 Virus Infection     MD Notes:  38 y.o. female with medical history of uncontrolled diabetes,  hypertension and hyperlipidemia who presented with abdominal pain and chest pain.    Patient reports around 9 PM she suddenly developed headache followed by chest pain and abdominal pain, associated with nausea, vomiting and palpitation.  Patient checked her blood glucose and found 450.  She also checks her blood pressure and found systolic blood pressure 195.     Assessment and Plan  1. Hyperglycemia: BG 328. At  home was in 400s. Seems uncontrolled DM. Last A1C was 14.3 on 05/2018. Patient reports that she is complaint with diabetic regimen.  2. Nausea, vomiting and abdominal pain: likely due to hyperglycemia  3. Abnormal UA: + for proteinuria, glucosuria and ketone, also few WBC. Patient denies urinary symptoms.  4. CP: ? Due to HTN  5. HA: seems due to HTN and hyperglycemia  6. HTN: BP 163/104. Elevated reading at home.  7. HLD  8. PNA on CTA chest    -Reviewed CXR; Negative for acute abnormality.  -CTA chest: R side PNA. No PE.  -Reviewed EKG; NSR. No ST elevation.  -US abdomen: No acute abnormality.    -Will give IVF. Check A1C. Starting CSI with hypoglycemia protocol. NPO for now.  -Zofran IV prn.  -Starting Lisinopril for renal protection as well as for BP control.   -Lipase, AST/ALT unremarkable. Mild elevated Alk phos 144.    -Telemetry monitoring.  -Will trend troponin.  -Starting Famotidine IV.  -Pain management.  -Starting Lisinopril   -Lipid profile and A1C.    -FU BC.   -COVID sent by ED. Patient has no resp symptoms or fever. CT chest shows R side PNA. Ceftriaxone and Azithromycin given in ED. Droplet and contact precautions.    Nutrition  NPO.    DVT/VTE  Prophylaxis  SCD/Lovenox.    Anticipated medical stability for discharge: 1-2 days.    Service status/Reason for ongoing hospitalization: Observation/hyperglycemia     Medications:   Current Facility-Administered Medications   Medication Dose Route Frequency    azithromycin  500 mg Intravenous Once    enoxaparin  40 mg Subcutaneous Daily    famotidine  20 mg Intravenous Q12H SCH    fluconazole  100 mg Oral Q24H SCH    insulin lispro  1-3 Units Subcutaneous QHS    insulin lispro  1-5 Units Subcutaneous TID AC    lisinopril  10 mg Oral Daily    sodium chloride  500 mL Intravenous Once

## 2019-02-08 NOTE — ED Triage Notes (Signed)
[facility] Kelsey Miles Kelsey Miles is a 38 y.o. female BIBA c/o 7/10 bilat UQ abdominal pain, chest pain, and emesis x 2 hours. Hx of DM & HTN.  BG 400 at home. Pt gave herself 30 units of lantus PTA. NKA

## 2019-02-08 NOTE — ED Notes (Signed)
[facility] ED NURSING NOTE FOR THE RECEIVING INPATIENT NURSE   ED NURSE Crist Infante [phone]   ED CHARGE RN [phone]   ADMISSION INFORMATION   Kelsey Miles is a 38 y.o. female admitted with a diagnosis of:    1. Community acquired pneumonia of right lung, unspecified part of lung         Isolation: Droplet   Allergies: Patient has no known allergies.   Holding Orders confirmed? Yes   Belongings Documented? Yes   Home medications sent to pharmacy confirmed? No   NURSING CARE   Mental Status: alert, oriented and anxious   ADL: Independent with all ADLs   Ambulation: mild difficulty nd/t dizziness     Personal Protective Equipment (PPE) (F2)     Gloves, N95, Shoe Covers and Surgical / Bouffant Cap    Pertinent Information  and Safety Concerns:   Spanish-speaking.   Pain uncontrolled while in ED.          ED Medications: See below, ED ISHAPED (P) Plan for medications administered in the ED   CT / NIH   CT Head ordered on this patient?  No   NIH/Dysphagia assessment done prior to admission? No    VITAL SIGNS   Time BP Temp Pulse Resp SpO2   0530 158/104 98.9 93 18 100   IV LINES   IV Catheter Size: 20 g x2  Peripheral IV 02/08/19 Right Hand (Active)   Site Assessment Clean;Dry;Intact 02/08/2019 12:56 AM   Line Status Saline Locked 02/08/2019 12:56 AM   Dressing Status Clean;Dry;Intact 02/08/2019 12:56 AM   Number of days: 0       Peripheral IV 02/08/19 Left Antecubital (Active)   Site Assessment Clean;Dry;Intact 02/08/2019  4:31 AM   Line Status Saline Locked;Flushed 02/08/2019  4:31 AM   Dressing Status Clean;Intact;Dry 02/08/2019  4:31 AM   Number of days: 0        LAB RESULTS   Labs Reviewed   CBC AND DIFFERENTIAL - Abnormal; Notable for the following components:       Result Value    WBC 11.36 (*)     Hgb 10.5 (*)     Hematocrit 32.0 (*)     Platelets 461 (*)     MCV 73.9 (*)     MCH 24.2 (*)     RDW 17 (*)     Neutrophils Absolute 8.17 (*)     All other components within normal limits   COMPREHENSIVE METABOLIC PANEL -  Abnormal; Notable for the following components:    Glucose 382 (*)     Sodium 135 (*)     Chloride 99 (*)     Albumin 3.2 (*)     Alkaline Phosphatase 144 (*)     Globulin 4.0 (*)     Albumin/Globulin Ratio 0.8 (*)     All other components within normal limits   IHS D-DIMER - Abnormal; Notable for the following components:    D-Dimer 0.55 (*)     All other components within normal limits   URINALYSIS, REFLEX TO MICROSCOPIC EXAM IF INDICATED - Abnormal; Notable for the following components:    Protein, UR 100 (*)     Glucose, UA 500 (*)     Ketones UA Trace (*)     Blood, UA Moderate (*)     RBC, UA 6 - 10 (*)     WBC, UA 11 - 25 (*)     Yeast, UA Many (*)  All other components within normal limits   GLUCOSE WHOLE BLOOD - POCT - Abnormal; Notable for the following components:    POCT - Glucose Whole blood 313 (*)     All other components within normal limits   GLUCOSE WHOLE BLOOD - POCT - Abnormal; Notable for the following components:    POCT - Glucose Whole blood 261 (*)     All other components within normal limits   URINE CULTURE    Narrative:     Replace urinary catheter prior to obtaining the urine culture  if it has been in place for greater than or equal to 14  days:->N/A No Foley  Indications for Urine Culture:->Suprapubic Pain/Tenderness or  Dysuria   CULTURE BLOOD AEROBIC AND ANAEROBIC    Narrative:     1 BLUE+1 PURPLE   CULTURE BLOOD AEROBIC AND ANAEROBIC    Narrative:     1 BLUE+1 PURPLE   COVID-19 (SARS-COV-2)   TROPONIN I   HEMOLYSIS INDEX   GFR   HCG QUANTITATIVE   URINE BHCG POC SOFT   LIPASE   LIPASE   CBC AND DIFFERENTIAL   COMPREHENSIVE METABOLIC PANEL   MAGNESIUM   PT/INR   APTT   FERRITIN   C-REACTIVE PROTEIN   LACTATE DEHYDROGENASE   TRIGLYCERIDES   CK   TROPONIN I   LACTIC ACID, PLASMA   IHS D-DIMER   FIBRINOGEN   PROCALCITONIN   HEMOLYSIS INDEX   HEMOLYSIS INDEX   GFR

## 2019-02-09 LAB — ECG 12-LEAD
Atrial Rate: 88 {beats}/min
P Axis: 30 degrees
P-R Interval: 142 ms
Q-T Interval: 404 ms
QRS Duration: 86 ms
QTC Calculation (Bezet): 488 ms
R Axis: 266 degrees
T Axis: 44 degrees
Ventricular Rate: 88 {beats}/min

## 2019-02-09 LAB — GLUCOSE WHOLE BLOOD - POCT
Whole Blood Glucose POCT: 137 mg/dL — ABNORMAL HIGH (ref 70–100)
Whole Blood Glucose POCT: 134 mg/dL — ABNORMAL HIGH (ref 70–100)
Whole Blood Glucose POCT: 227 mg/dL — ABNORMAL HIGH (ref 70–100)
Whole Blood Glucose POCT: 265 mg/dL — ABNORMAL HIGH (ref 70–100)

## 2019-02-09 LAB — TROPONIN I: Troponin I: <0.01 ng/mL (ref 0.00–0.05)

## 2019-02-09 MED ORDER — OXYCODONE-ACETAMINOPHEN 5-325 MG PO TABS
1.0000 | ORAL_TABLET | ORAL | Status: DC | PRN
Start: 2019-02-09 — End: 2019-02-11
  Administered 2019-02-09 – 2019-02-10 (×4): 1 via ORAL
  Filled 2019-02-09 (×5): qty 1

## 2019-02-09 MED ORDER — CEFTRIAXONE SODIUM 1 G IJ SOLR
1.00 g | INTRAMUSCULAR | Status: DC
Start: 2019-02-09 — End: 2019-02-10
  Administered 2019-02-09 – 2019-02-10 (×2): 1 g via INTRAVENOUS
  Filled 2019-02-09 (×3): qty 1000

## 2019-02-09 MED ORDER — SODIUM CHLORIDE 0.9 % IV SOLN
500.00 mg | INTRAVENOUS | Status: DC
Start: 2019-02-09 — End: 2019-02-09
  Administered 2019-02-09: 11:00:00 500 mg via INTRAVENOUS
  Filled 2019-02-09: qty 500

## 2019-02-09 MED ORDER — AZITHROMYCIN 250 MG PO TABS
500.0000 mg | ORAL_TABLET | Freq: Every day | ORAL | Status: DC
Start: 2019-02-10 — End: 2019-02-10
  Administered 2019-02-10: 08:00:00 500 mg via ORAL
  Filled 2019-02-09: qty 2

## 2019-02-09 MED ORDER — OXYCODONE-ACETAMINOPHEN 5-325 MG PO TABS
1.00 | ORAL_TABLET | Freq: Once | ORAL | Status: AC
Start: 2019-02-09 — End: 2019-02-09
  Administered 2019-02-09: 01:00:00 1 via ORAL
  Filled 2019-02-09: qty 1

## 2019-02-09 MED ORDER — ONDANSETRON HCL 4 MG/2ML IJ SOLN
4.00 mg | Freq: Three times a day (TID) | INTRAMUSCULAR | Status: DC | PRN
Start: 2019-02-09 — End: 2019-02-11
  Administered 2019-02-09 – 2019-02-11 (×3): 4 mg via INTRAVENOUS
  Filled 2019-02-09 (×3): qty 2

## 2019-02-09 NOTE — Progress Notes (Signed)
CM spoke w/ the attending physician regarding the symptoms described by pt. Also MD notified about pt not being able to self-isolate and having 3 small children at home.                Garry Heater, RN MSN       Clinical Case Manager       [facility]        [address], [place], [place] [address]      [email or web address]      [phone]

## 2019-02-09 NOTE — Progress Notes (Addendum)
Pt is receiving IV fluids  Reported severe headache pain, tylenol given with no relief. Covering MD (Dr. Glendell Docker) notified, one time dose of Percocet ordered and given.  Lab called and reported pt is positive for COVID19, covering MD (Dr. Glendell Docker) notified & aware  NPO, sips with meds

## 2019-02-09 NOTE — Progress Notes (Addendum)
CM spoke w/ pt over the phone.     Onalee Hua [facility] Spanish interpreter helped facilitate the conversation.    A&OX4, COVID 19 Positive, PNA. C/o headache with dizziness and nausea, back pain, and chest pain with palpitations at times.     Pt lives w/ her husband and 3 small children in one bedroom apartment. Pt sleeps on the couch. She will not be able to self isolate and she won't  not have another place to go to.     PTA independent w/ ADLs.    PCP @ [facility]. Pt will call to schedule f/u appointment.  Pt will refill fer Rx at [facility]    Husband can transport home at discharge.      DCP- home w/ family if repeat COVID 19 negative. No CM needs identified.            Garry Heater, RN MSN       Clinical Case Manager       [facility]        [address], [place], [place] [address]      [email or web address]      [phone]         02/09/19 1743   Patient Type   Within 30 Days of Previous Admission? No   Healthcare Decisions   Interviewed: Patient   Orientation/Decision Making Abilities of Patient Alert and Oriented x3, able to make decisions   Advance Directive Patient does not have advance directive   Prior to admission   Prior level of function Independent with ADLs;Ambulates independently   Type of Residence Private residence   Have running water, electricity, heat, etc? Yes   Living Arrangements Spouse/significant other   How do you get to your MD appointments? husband   How do you get your groceries? self   Who fixes your meals? self   Who does your laundry? self   Who picks up your prescriptions? self   Dressing Independent   Grooming Independent   Feeding Independent   Bathing Independent   Toileting Independent   Name of Prior Assisted Living Facility n/a   Prior SNF admission? (Detail) n/a   Prior Rehab admission? (Detail) n/a   Adult Protective Services (APS) involved? No   Discharge Planning   Support Systems Spouse/significant other   Patient expects to be discharged to: home    Anticipated [place] plan discussed with: Patient   Potential barriers to discharge: No insurance / under insured   Mode of transportation: Private car (family member)   Does the patient have perscription coverage? No   Consults/Providers   PT Evaluation Needed 2   OT Evalulation Needed 2   SLP Evaluation Needed 2   Outcome Palliative Care Screen Screened but did not meet criteria for intervention   Correct PCP listed in Epic? Yes   PCP   PCP on file was verified as the current PCP? Yes   Important Message from [facility] Notice   Patient received 1st IMM Letter? n/a

## 2019-02-09 NOTE — Progress Notes (Signed)
[facility]  PROGRESS NOTE      Patient: Kelsey Miles  Date: 02/09/2019   LOS: 0 Days  Admission Date: 02/08/2019   MRN: [id number]  Attending: Effie Shy  Please contact me on perfectserve       ASSESSMENT/PLAN     Kelsey Miles is a 38 y.o. female with medical history of uncontrolled diabetes,  hypertension and hyperlipidemia who presented with abdominal pain and chest pain.    Interval Summary:     Pneumonia   Probably secondary to COVID  IV fluids  IV Ceftriaxone/IV Azithromycin  COVID positive    DM  Insulin sliding scale    Uncontrolled Hypertension  Better BP control  Continue lisinopril    UTI  On antibiotics and fluconazole because of yeast in the urine    Analgesia: acetaminophen    Nutrition: Diabetic diet    DVT Prophylaxis: Enoxaparin SQ       Code Status: Full    DISPO: Home when stable         SUBJECTIVE     Kelsey Miles states she feels better    MEDICATIONS     Current Facility-Administered Medications   Medication Dose Route Frequency    enoxaparin  40 mg Subcutaneous Daily    famotidine  20 mg Oral Q12H SCH    Or    famotidine  20 mg Intravenous Q12H SCH    fluconazole  100 mg Oral Q24H SCH    insulin lispro  1-3 Units Subcutaneous QHS    insulin lispro  1-5 Units Subcutaneous TID AC    lisinopril  10 mg Oral Daily       PHYSICAL EXAM     Vitals:    02/09/19 0826   BP: 144/88   Pulse: 79   Resp: 16   Temp: 98 F (36.7 C)   SpO2: 99%       Temperature: Temp  Min: 97.7 F (36.5 C)  Max: 98.4 F (36.9 C)  Pulse: Pulse  Min: 79  Max: 85  Respiratory: Resp  Min: 16  Max: 18  Non-Invasive BP: BP  Min: 128/86  Max: 153/99  Pulse Oximetry SpO2  Min: 98 %  Max: 100 %    Intake and Output Summary (Last 24 hours) at Date Time    Intake/Output Summary (Last 24 hours) at 02/09/2019 0845  Last data filed at 02/08/2019 2302  Gross per 24 hour   Intake 10 ml   Output    Net 10 ml         GEN APPEARANCE: Normal;  A&OX3  HEENT: PERLA; EOMI; Conjunctiva Clear  NECK: Supple; No bruits   CVS: RRR, S1, S2; No M/G/R  LUNGS: CTAB; No Wheezes; No Rhonchi: No rales  ABD: Soft; No TTP; + Normoactive BS  EXT: No edema; Pulses 2+ and intact  Skin exam:  pink  NEURO: CN 2-12 intact; No Focal neurological deficits  CAP REFILL:  Normal  MENTAL STATUS:  Normal    Exam done by Effie Shy, MD on 02/09/19 at 8:45 AM      LABS     Recent Labs   Lab 02/08/19  0743 02/08/19  0058   WBC 7.23 11.36*   RBC 4.23 4.33   Hgb 10.2* 10.5*   Hematocrit 32.2* 32.0*   MCV 76.1* 73.9*   Platelets 434* 461*       Recent Labs   Lab 02/08/19  0743 02/08/19  1610   Sodium 135* 135*   Potassium 3.7 3.8   Chloride 102 99*   CO2 24 24   BUN 10.0 12.0   Creatinine 0.6 0.7   Glucose 315* 382*   Calcium 7.8* 9.1   Magnesium 1.4*  --        Recent Labs   Lab 02/08/19  0743 02/08/19  0058   ALT 15 16   AST (SGOT) 14 17   Bilirubin, Total 0.2 0.4   Albumin 2.9* 3.2*   Alkaline Phosphatase 117* 144*       Recent Labs   Lab 02/09/19  0501 02/08/19  0743 02/08/19  0058   Creatine Kinase (CK)  --  37  --    Troponin I <0.01 <0.01   <0.01 <0.01       Recent Labs   Lab 02/08/19  0743   PT INR 1.0   PT 13.0   PTT 28       Microbiology Results     Procedure Component Value Units Date/Time    Blood Culture Aerobic/Anaerobic #1 [[id number]] Collected:  02/08/19 0520    Specimen:  Arm from Blood, Venipuncture Updated:  02/08/19 0902    Narrative:       1 BLUE+1 PURPLE    Blood Culture Aerobic/Anaerobic #2 [[id number]] Collected:  02/08/19 0520    Specimen:  Arm from Blood, Venipuncture Updated:  02/08/19 0902    Narrative:       1 BLUE+1 PURPLE    COVID-19 (SARS-CoV-2) [place]) [[id number]]  (Abnormal) Collected:  02/08/19 0543    Specimen:  Nasopharyngeal Swab from Nasopharynx Updated:  02/08/19 2137     SARS-CoV-2 Specimen Source Nasopharyngeal     SARS CoV 2 Overall Result Detected     Comment: Critical COVAB results given to RN [id number].  Readback confirmed by GV at  21:37  02/08/2019  Test performed using the Abbott m2000 SARS-CoV-2 EUA Test.  Please  see Fact Sheets for patients and providers located at:  http://www.rice.biz/  This test is for the qualitative detection of SARS-CoV-2(COVID-19)  nucleic acid.  Viral nucleic acids may persist in vivo, independent of viability.  Detection of viral nucleic acid does not imply the presence of  infectious virus, or that virus nucleic acid is the cause of  clinical symptoms.  Test performance has not been established for  immunocompromised patients or patients without signs and symptoms of  respiratory infection. Negative results do not preclude SARS-CoV-2  infection and should not be used as the sole basis for patient  management decisions.  Invalid results may be due to inhibiting substances in the specimen  and recollection should occur.         Narrative:       o Collect and clearly label specimen type:  o PREFERRED-Upper respiratory specimen: One Nasopharyngeal  Swab in Transport Media.  o Hand deliver to laboratory ASAP    Urine culture [[id number]] Collected:  02/08/19 0109    Specimen:  Urine, Clean Catch Updated:  02/09/19 0714    Narrative:       Replace urinary catheter prior to obtaining the urine culture  if it has been in place for greater than or equal to 14  days:->N/A No Foley  Indications for Urine Culture:->Suprapubic Pain/Tenderness or  Dysuria  ORDER#: [id number]  ORDERED BY: Lysle Pearl  SOURCE: Urine, Clean Catch                           COLLECTED:  02/08/19 01:09  ANTIBIOTICS AT COLL.:                                RECEIVED :  02/08/19 07:06  Culture Urine                              FINAL       02/09/19 07:14  02/09/19   1,000 - 9,000 CFU/ML of normal urogenital or skin microbiota, no further             work             RADIOLOGY     Upon my review:    Sanjuana Letters  8:45 AM 02/09/2019

## 2019-02-09 NOTE — Progress Notes (Signed)
Pt A+Ox4, afebrile, RA, complains of a headache not resolved with tylenol, gave her a dose percocet with better relief. Pt vomited once, gave her Zofran, started on a diet, encouraged to take it slow. IVF in place, on IVABx. Pt complained of dizziness, MD aware, no new order in place. Pt is a low fall risk, but encouraged to call as needed.

## 2019-02-09 NOTE — UM Notes (Signed)
02/09/19 1247  Admit to Inpatient Once    Admitting Physician Jimmey Ralph C    Diagnosis Suspected Covid-19 Virus Infection    Estimated Length of Stay > or = to 2 midnights    Tentative Discharge Plan? Home or Self Care    Patient Class Inpatient              5.1.2020  Concurrent  review    COVID 19 detected on 4/30.2020     98.1 F (36.7 C) Oral 84 98 % 16 153/99     Culture Blood Aerobic and Anaerobic    PRELIM   02/09/19 09:21  02/09/19  No Growth after 1 day/s of incubation.    Interval Summary:     Pneumonia   Probably secondary to COVID  IV fluids  IV Ceftriaxone/IV Azithromycin  COVID positive    DM  Insulin sliding scale    Uncontrolled Hypertension  Better BP control  Continue lisinopril    UTI  On antibiotics and fluconazole because of yeast in the urine    Analgesia: acetaminophen    Nutrition: Diabetic diet    DVT Prophylaxis: Enoxaparin SQ      Code Status: Full    DISPO: Home when stable    Scheduled Meds:  Current Facility-Administered Medications   Medication Dose Route Frequency    azithromycin  500 mg Intravenous Q24H    cefTRIAXone  1 g Intravenous Q24H    enoxaparin  40 mg Subcutaneous Daily    famotidine  20 mg Oral Q12H SCH    Or    famotidine  20 mg Intravenous Q12H SCH    fluconazole  100 mg Oral Q24H SCH    insulin lispro  1-3 Units Subcutaneous QHS    insulin lispro  1-5 Units Subcutaneous TID AC    lisinopril  10 mg Oral Daily     Continuous Infusions:   sodium chloride 100 mL/hr at 02/08/19 2236     PRN Meds:.acetaminophen, Nursing communication: Adult Hypoglycemia Treatment Algorithm **AND** dextrose **AND** dextrose **AND** glucagon (rDNA), ondansetron, oxyCODONE-acetaminophen    UTILIZATION REVIEW CONTACT: Name: Elfredia Nevins, RN, BSN  Clinical Case Manager  - Utilization Review  NPI:   [id number]  Tax ID:  [id number]  Phone: [phone]  Fax: [phone]  Email: [email or web address]

## 2019-02-09 NOTE — Plan of Care (Signed)
Problem: Safety  Goal: Patient will be free from injury during hospitalization  Outcome: Progressing  Flowsheets (Taken 02/09/2019 2202)  Patient will be free from injury during hospitalization : Assess patient's risk for falls and implement fall prevention plan of care per policy; Provide and maintain safe environment     Problem: Pain  Goal: Pain at adequate level as identified by patient  Outcome: Progressing  Flowsheets (Taken 02/09/2019 2202)  Pain at adequate level as identified by patient: Identify patient comfort function goal; Evaluate if patient comfort function goal is met; Evaluate patient's satisfaction with pain management progress; Offer non-pharmacological pain management interventions     AOX4, VSS. Not in any respiratory distress. O2 saturation 97% on RA.  C/O headache at a rate of 5/10 medicated with Tylenol with relief noted after.  Slept at interval.  Voided freely.  Will continue to monitor. Safety.

## 2019-02-10 LAB — BASIC METABOLIC PANEL
Anion Gap: 6 (ref 5.0–15.0)
BUN: 6 mg/dL — ABNORMAL LOW (ref 7.0–19.0)
CO2: 27 mEq/L (ref 22–29)
Calcium: 7.6 mg/dL — ABNORMAL LOW (ref 8.5–10.5)
Chloride: 107 mEq/L (ref 100–111)
Creatinine: 0.6 mg/dL (ref 0.6–1.0)
Glucose: 212 mg/dL — ABNORMAL HIGH (ref 70–100)
Potassium: 3.4 mEq/L — ABNORMAL LOW (ref 3.5–5.1)
Sodium: 140 mEq/L (ref 136–145)

## 2019-02-10 LAB — CBC AND DIFFERENTIAL
Absolute NRBC: 0 10*3/uL (ref 0.00–0.00)
Basophils Absolute Automated: 0.04 10*3/uL (ref 0.00–0.08)
Basophils Automated: 0.8 %
Eosinophils Absolute Automated: 0.3 10*3/uL (ref 0.00–0.44)
Eosinophils Automated: 5.9 %
Hematocrit: 31.4 % — ABNORMAL LOW (ref 34.7–43.7)
Hgb: 9.8 g/dL — ABNORMAL LOW (ref 11.4–14.8)
Immature Granulocytes Absolute: 0.01 10*3/uL (ref 0.00–0.07)
Immature Granulocytes: 0.2 %
Lymphocytes Absolute Automated: 2.48 10*3/uL (ref 0.42–3.22)
Lymphocytes Automated: 48.9 %
MCH: 24.1 pg — ABNORMAL LOW (ref 25.1–33.5)
MCHC: 31.2 g/dL — ABNORMAL LOW (ref 31.5–35.8)
MCV: 77.3 fL — ABNORMAL LOW (ref 78.0–96.0)
MPV: 8.9 fL (ref 8.9–12.5)
Monocytes Absolute Automated: 0.45 10*3/uL (ref 0.21–0.85)
Monocytes: 8.9 %
Neutrophils Absolute: 1.79 10*3/uL (ref 1.10–6.33)
Neutrophils: 35.3 %
Nucleated RBC: 0 /100 WBC (ref 0.0–0.0)
Platelets: 462 10*3/uL — ABNORMAL HIGH (ref 142–346)
RBC: 4.06 10*6/uL (ref 3.90–5.10)
RDW: 17 % — ABNORMAL HIGH (ref 11–15)
WBC: 5.07 10*3/uL (ref 3.10–9.50)

## 2019-02-10 LAB — HEMOLYSIS INDEX: Hemolysis Index: 3 (ref 0–18)

## 2019-02-10 LAB — GLUCOSE WHOLE BLOOD - POCT
Whole Blood Glucose POCT: 225 mg/dL — ABNORMAL HIGH (ref 70–100)
Whole Blood Glucose POCT: 206 mg/dL — ABNORMAL HIGH (ref 70–100)
Whole Blood Glucose POCT: 280 mg/dL — ABNORMAL HIGH (ref 70–100)
Whole Blood Glucose POCT: 300 mg/dL — ABNORMAL HIGH (ref 70–100)

## 2019-02-10 LAB — GFR: EGFR: >60

## 2019-02-10 MED ORDER — LEVOFLOXACIN 500 MG PO TABS
500.0000 mg | ORAL_TABLET | Freq: Every day | ORAL | Status: DC
Start: 2019-02-10 — End: 2019-02-11
  Administered 2019-02-10 – 2019-02-11 (×2): 500 mg via ORAL
  Filled 2019-02-10 (×2): qty 1

## 2019-02-10 NOTE — Progress Notes (Signed)
[facility]  PROGRESS NOTE      Patient: Kelsey Miles  Date: 02/10/2019   LOS: 1 Days  Admission Date: 02/08/2019   MRN: [id number]  Attending: Effie Shy  Please contact me on perfectserve       ASSESSMENT/PLAN     Kelsey Miles is a 38 y.o. female with medical history of uncontrolled diabetes,  hypertension and hyperlipidemia who presented with abdominal pain and chest pain.    Interval Summary:     Pneumonia   Probably secondary to COVID pneumonia  IV fluids  Switch to levofloxacin IV      DM  Insulin sliding scale    Uncontrolled Hypertension  Better BP control  Continue lisinopril    UTI  On antibiotics and fluconazole because of yeast in the urine    Analgesia: acetaminophen    Nutrition: Diabetic diet    DVT Prophylaxis: Enoxaparin SQ       Code Status: Full    DISPO: Per CM note. Patient lives in one room bedroom apartment with husband and children. She sleeps on a couch. Will [place] home when repeat COVID negative.  Patient should be discharged on oral levofloxacin to cover UTI/PNA       SUBJECTIVE     Kelsey Miles states she feels better    MEDICATIONS     Current Facility-Administered Medications   Medication Dose Route Frequency    azithromycin  500 mg Oral Daily    cefTRIAXone  1 g Intravenous Q24H    enoxaparin  40 mg Subcutaneous Daily    famotidine  20 mg Oral Q12H SCH    Or    famotidine  20 mg Intravenous Q12H SCH    fluconazole  100 mg Oral Q24H SCH    insulin lispro  1-3 Units Subcutaneous QHS    insulin lispro  1-5 Units Subcutaneous TID AC    lisinopril  10 mg Oral Daily       PHYSICAL EXAM     Vitals:    02/10/19 1030   BP: (!) 142/95   Pulse:    Resp: 17   Temp: 97.3 F (36.3 C)   SpO2: 99%       Temperature: Temp  Min: 97.2 F (36.2 C)  Max: 98.6 F (37 C)  Pulse: Pulse  Min: 77  Max: 96  Respiratory: Resp  Min: 16  Max: 18  Non-Invasive BP: BP  Min: 137/88  Max: 147/93  Pulse Oximetry SpO2  Min: 99 %  Max: 100 %    Intake and Output Summary (Last 24 hours) at  Date Time    Intake/Output Summary (Last 24 hours) at 02/10/2019 1119  Last data filed at 02/10/2019 0932  Gross per 24 hour   Intake 658 ml   Output    Net 658 ml         GEN APPEARANCE: Normal;  A&OX3  HEENT: PERLA; EOMI; Conjunctiva Clear  NECK: Supple; No bruits  CVS: RRR, S1, S2; No M/G/R  LUNGS: CTAB; No Wheezes; No Rhonchi: No rales  ABD: Soft; No TTP; + Normoactive BS  EXT: No edema; Pulses 2+ and intact  Skin exam:  pink  NEURO: CN 2-12 intact; No Focal neurological deficits  CAP REFILL:  Normal  MENTAL STATUS:  Normal    Exam done by Effie Shy, MD on 02/09/19 at 8:45 AM      LABS     Recent Labs   Lab  02/10/19  0437 02/08/19  0743 02/08/19  0058   WBC 5.07 7.23 11.36*   RBC 4.06 4.23 4.33   Hgb 9.8* 10.2* 10.5*   Hematocrit 31.4* 32.2* 32.0*   MCV 77.3* 76.1* 73.9*   Platelets 462* 434* 461*       Recent Labs   Lab 02/10/19  0437 02/08/19  0743 02/08/19  0058   Sodium 140 135* 135*   Potassium 3.4* 3.7 3.8   Chloride 107 102 99*   CO2 27 24 24    BUN 6.0* 10.0 12.0   Creatinine 0.6 0.6 0.7   Glucose 212* 315* 382*   Calcium 7.6* 7.8* 9.1   Magnesium  --  1.4*  --        Recent Labs   Lab 02/08/19  0743 02/08/19  0058   ALT 15 16   AST (SGOT) 14 17   Bilirubin, Total 0.2 0.4   Albumin 2.9* 3.2*   Alkaline Phosphatase 117* 144*       Recent Labs   Lab 02/09/19  0501 02/08/19  0743 02/08/19  0058   Creatine Kinase (CK)  --  37  --    Troponin I <0.01 <0.01   <0.01 <0.01       Recent Labs   Lab 02/08/19  0743   PT INR 1.0   PT 13.0   PTT 28       Microbiology Results     Procedure Component Value Units Date/Time    Blood Culture Aerobic/Anaerobic #1 [[id number]] Collected:  02/08/19 0520    Specimen:  Arm from Blood, Venipuncture Updated:  02/08/19 0902    Narrative:       1 BLUE+1 PURPLE    Blood Culture Aerobic/Anaerobic #2 [[id number]] Collected:  02/08/19 0520    Specimen:  Arm from Blood, Venipuncture Updated:  02/08/19 0902    Narrative:       1 BLUE+1 PURPLE    COVID-19 (SARS-CoV-2) [place]) [[id number]]   (Abnormal) Collected:  02/08/19 0543    Specimen:  Nasopharyngeal Swab from Nasopharynx Updated:  02/08/19 2137     SARS-CoV-2 Specimen Source Nasopharyngeal     SARS CoV 2 Overall Result Detected     Comment: Critical COVAB results given to RN [id number].  Readback confirmed by GV at  21:37  02/08/2019  Test performed using the Abbott m2000 SARS-CoV-2 EUA Test.  Please see Fact Sheets for patients and providers located at:  http://www.rice.biz/  This test is for the qualitative detection of SARS-CoV-2(COVID-19)  nucleic acid.  Viral nucleic acids may persist in vivo, independent of viability.  Detection of viral nucleic acid does not imply the presence of  infectious virus, or that virus nucleic acid is the cause of  clinical symptoms.  Test performance has not been established for  immunocompromised patients or patients without signs and symptoms of  respiratory infection. Negative results do not preclude SARS-CoV-2  infection and should not be used as the sole basis for patient  management decisions.  Invalid results may be due to inhibiting substances in the specimen  and recollection should occur.         Narrative:       o Collect and clearly label specimen type:  o PREFERRED-Upper respiratory specimen: One Nasopharyngeal  Swab in Transport Media.  o Hand deliver to laboratory ASAP    Urine culture [[id number]] Collected:  02/08/19 0109    Specimen:  Urine, Clean Catch Updated:  02/09/19 4742    Narrative:  Replace urinary catheter prior to obtaining the urine culture  if it has been in place for greater than or equal to 14  days:->N/A No Foley  Indications for Urine Culture:->Suprapubic Pain/Tenderness or  Dysuria  ORDER#: [id number]                                    ORDERED BY: Lysle Pearl  SOURCE: Urine, Clean Catch                           COLLECTED:  02/08/19 01:09  ANTIBIOTICS AT COLL.:                                RECEIVED :  02/08/19 07:06  Culture Urine                               FINAL       02/09/19 07:14  02/09/19   1,000 - 9,000 CFU/ML of normal urogenital or skin microbiota, no further             work             RADIOLOGY     Upon my review:    Sanjuana Letters  11:19 AM 02/10/2019

## 2019-02-10 NOTE — Progress Notes (Signed)
Pt A+Ox4, afebrile, RA, complains of a headache not resolved with tylenol, gave her a dose of percocet with better relief. Pt denies nausea and vomiting today, Po intake improving. Pt switched from IVABx to PO. Denies dizziness today. No complaints of chest pain and SOB. COVID precautions maintained.

## 2019-02-10 NOTE — Progress Notes (Signed)
Pt again complained her headache is not relieved with percocet, and the unrelieved headache is causing her lightheadedness and nausea/vomiting, attending texted via Perfect Serve.

## 2019-02-11 LAB — CBC AND DIFFERENTIAL
Absolute NRBC: 0 10*3/uL (ref 0.00–0.00)
Basophils Absolute Automated: 0.05 10*3/uL (ref 0.00–0.08)
Basophils Automated: 0.9 %
Eosinophils Absolute Automated: 0.34 10*3/uL (ref 0.00–0.44)
Eosinophils Automated: 6.1 %
Hematocrit: 33.4 % — ABNORMAL LOW (ref 34.7–43.7)
Hgb: 10.5 g/dL — ABNORMAL LOW (ref 11.4–14.8)
Immature Granulocytes Absolute: 0.01 10*3/uL (ref 0.00–0.07)
Immature Granulocytes: 0.2 %
Lymphocytes Absolute Automated: 2.51 10*3/uL (ref 0.42–3.22)
Lymphocytes Automated: 45.2 %
MCH: 23.9 pg — ABNORMAL LOW (ref 25.1–33.5)
MCHC: 31.4 g/dL — ABNORMAL LOW (ref 31.5–35.8)
MCV: 76.1 fL — ABNORMAL LOW (ref 78.0–96.0)
MPV: 9.1 fL (ref 8.9–12.5)
Monocytes Absolute Automated: 0.45 10*3/uL (ref 0.21–0.85)
Monocytes: 8.1 %
Neutrophils Absolute: 2.19 10*3/uL (ref 1.10–6.33)
Neutrophils: 39.5 %
Nucleated RBC: 0 /100 WBC (ref 0.0–0.0)
Platelets: 509 10*3/uL — ABNORMAL HIGH (ref 142–346)
RBC: 4.39 10*6/uL (ref 3.90–5.10)
RDW: 17 % — ABNORMAL HIGH (ref 11–15)
WBC: 5.55 10*3/uL (ref 3.10–9.50)

## 2019-02-11 LAB — GFR: EGFR: >60

## 2019-02-11 LAB — HEMOLYSIS INDEX
Hemolysis Index: 1 (ref 0–18)
Hemolysis Index: 1 (ref 0–18)

## 2019-02-11 LAB — BASIC METABOLIC PANEL
Anion Gap: 9 (ref 5.0–15.0)
BUN: 7 mg/dL (ref 7.0–19.0)
CO2: 27 mEq/L (ref 22–29)
Calcium: 8.8 mg/dL (ref 8.5–10.5)
Chloride: 103 mEq/L (ref 100–111)
Creatinine: 0.6 mg/dL (ref 0.6–1.0)
Glucose: 194 mg/dL — ABNORMAL HIGH (ref 70–100)
Potassium: 3.4 mEq/L — ABNORMAL LOW (ref 3.5–5.1)
Sodium: 139 mEq/L (ref 136–145)

## 2019-02-11 LAB — GLUCOSE WHOLE BLOOD - POCT: Whole Blood Glucose POCT: 242 mg/dL — ABNORMAL HIGH (ref 70–100)

## 2019-02-11 LAB — COVID-19 (SARS-COV-2): SARS CoV 2 Overall Result: DETECTED — AB

## 2019-02-11 LAB — IRON PROFILE
Iron Saturation: 11 % — ABNORMAL LOW (ref 15–50)
Iron: 32 ug/dL — ABNORMAL LOW (ref 40–145)
TIBC: 283 ug/dL (ref 265–497)
UIBC: 251 ug/dL (ref 126–382)

## 2019-02-11 MED ORDER — POTASSIUM CHLORIDE CRYS ER 20 MEQ PO TBCR
40.00 meq | EXTENDED_RELEASE_TABLET | Freq: Once | ORAL | Status: AC
Start: 2019-02-11 — End: 2019-02-11
  Administered 2019-02-11: 11:00:00 40 meq via ORAL
  Filled 2019-02-11: qty 2

## 2019-02-11 MED ORDER — GUAIFENESIN-CODEINE 100-10 MG/5ML PO SYRP
5.00 mL | ORAL_SOLUTION | Freq: Four times a day (QID) | ORAL | 0 refills | Status: DC | PRN
Start: 2019-02-11 — End: 2019-11-29

## 2019-02-11 MED ORDER — MAGNESIUM SULFATE IN D5W 1-5 GM/100ML-% IV SOLN
1.0000 g | INTRAVENOUS | Status: AC
Start: 2019-02-11 — End: 2019-02-11
  Administered 2019-02-11 (×2): 1 g via INTRAVENOUS
  Filled 2019-02-11: qty 100

## 2019-02-11 MED ORDER — ONDANSETRON 4 MG PO TBDP
4.00 mg | ORAL_TABLET | Freq: Four times a day (QID) | ORAL | 0 refills | Status: DC | PRN
Start: 2019-02-11 — End: 2019-11-29

## 2019-02-11 MED ORDER — LISINOPRIL 10 MG PO TABS
10.00 mg | ORAL_TABLET | Freq: Once | ORAL | Status: AC
Start: 2019-02-11 — End: 2019-02-11
  Administered 2019-02-11: 11:00:00 10 mg via ORAL
  Filled 2019-02-11: qty 1

## 2019-02-11 MED ORDER — LISINOPRIL 20 MG PO TABS
20.0000 mg | ORAL_TABLET | Freq: Every day | ORAL | Status: DC
Start: 2019-02-12 — End: 2019-02-11

## 2019-02-11 MED ORDER — LISINOPRIL 20 MG PO TABS
20.00 mg | ORAL_TABLET | Freq: Every day | ORAL | 0 refills | Status: AC
Start: 2019-02-12 — End: ?

## 2019-02-11 MED ORDER — GUAIFENESIN-CODEINE 100-10 MG/5ML PO SYRP
5.00 mL | ORAL_SOLUTION | Freq: Four times a day (QID) | ORAL | Status: DC | PRN
Start: 2019-02-11 — End: 2019-02-11

## 2019-02-11 MED ORDER — LISINOPRIL 20 MG PO TABS
20.0000 mg | ORAL_TABLET | Freq: Every day | ORAL | 0 refills | Status: DC
Start: 2019-02-12 — End: 2019-02-11

## 2019-02-11 MED ORDER — BENZONATATE 100 MG PO CAPS
100.00 mg | ORAL_CAPSULE | Freq: Three times a day (TID) | ORAL | 1 refills | Status: DC | PRN
Start: 2019-02-11 — End: 2019-11-29

## 2019-02-11 MED ORDER — FLUCONAZOLE 100 MG PO TABS
100.00 mg | ORAL_TABLET | ORAL | 0 refills | Status: AC
Start: 2019-02-12 — End: 2019-02-15

## 2019-02-11 MED ORDER — LEVOFLOXACIN 500 MG PO TABS
500.00 mg | ORAL_TABLET | Freq: Every day | ORAL | 0 refills | Status: AC
Start: 2019-02-12 — End: 2019-02-15

## 2019-02-11 NOTE — Discharge Instr - AVS First Page (Signed)
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 Cuando vuelve a trabajar, [place] una mascarilla.       Call Your Doctor if you have:   Chest pain   Shortness of breath or difficulty breathing   Temperature greater than 100.4   Persistent nausea and vomiting   Severe uncontrolled pain   Signs of infection (pain, swelling, redness, tenderness, odor, or green/yellow discharge)   Headache or visual disturbances   Hives   Persistent dizziness or light-headedness   Extreme fatigue   Bleeding   Any other questions or concerns you may have after discharge   In an emergency, call 911 or go to an Emergency Department at a nearby hospital     Additional Instructions:   -Schedule a follow up appointment with your Primary Care Provider within 1 week of discharge.   -Carry a list of your medications and allergies with you at all times   -Resume your usual diet unless otherwise directed. Good nutrition promotes quicker healing.   -Call your pharmacy at least 1 week in advance to refill prescriptions     Information on Taking Medicine Safely   Medicine is given to help treat or prevent illness. But if you dont take it correctly, it might not help. It might even harm you. Your doctor or pharmacist can help you learn the right way to take your medicine. Listed below are some tips to help you take medicine safely.     Safety Tips   1. Have a routine for taking each medicine. Make it part of something you do each day, such as brushing your teeth or eating a meal.   2. When you go to the hospital or your doctors office, bring all your current medicines in their  original boxes or bottles. If you cant do that, bring an up-to-date list of your medicines.   3. Do not stop taking a prescription medicine unless your doctor tells you to. Doing so could make your condition worse.   4. Do not share medicines.   5. Let your doctor and pharmacist know of any allergies you have.   6. Taking prescription medicines with alcohol, street drugs, herbs, supplements, or even some over-the-counter medicines can be harmful. Talk to your doctor or pharmacist before using any of these things while taking a prescription medcine.   7. When filling your prescriptions, try using the same pharmacy for all your medicines. If not, let the pharmacist know what medicines you are already taking.   8. Keep medicines out of the reach of children and pets. Store medicines in a cool, dry, dark place--not in the bathroom or in the kitchen near moisture or heat.   9. Do not use medicine that has expired or that doesnt look or smell right. Bring expired or old medicine to your pharmacy for disposal.     Using Generic Medicines   Medicines have brand names and generic (chemical) names. When a medicine is first made, it is sold only under its brand name. Later, it can be made and sold as a generic. Generic  medicines cost less than brand-name medicines and most work just as well. Unless their doctor says otherwise, most people can use the generic medicine instead of the brand-name medicine.     Always follow your healthcare professional's instructions.     If you are unable to obtain an appointment, unable to obtain newly prescribed medications, or are unclear about any of your discharge instructions please contact me at [phone] (M-F, 8am-3pm) or weekends and after hours via the hospital operator [phone], the hospital case manager, or your primary care physician.    Finally, as your discharging physician, you may be receiving a survey which is regarding my care. I would greatly value and appreciate  your feedback as I strive for excellence.     Respectfully yours,    Ralene Bathe MD  [facility]  [facility]

## 2019-02-11 NOTE — Progress Notes (Signed)
Pt to [facility], plans to self-isolate from her husband and daughter in the bedroom, while husband and daughter remain in other portion of apartment.    No further CM needs identified.       02/11/19 1500   Discharge Disposition   Patient preference/choice provided? Yes   Physical Discharge Disposition Home   Mode of Transportation Car   Patient/Family/POA notified of transfer plan Patient informed only   Patient agreeable to discharge plan/expected d/c date? Yes   Bedside nurse notified of transport plan? Yes   CM Interventions   Follow up appointment scheduled? No   Reason no follow up scheduled? Family to schedule   Referral made for home health RN visit? Does not meet home bound criteria   Multidisciplinary rounds/family meeting before d/c? No   Medicare Checklist   Is this a Medicare patient? No     Chinita Greenland, MSW  x: [phone]

## 2019-02-11 NOTE — Progress Notes (Addendum)
Pt is AAO x 4, call bell and side table is with in reach, independent , monitoring vitals, labs and blood sugar, magnesium and potassium replaced, antiemetic given for nausea, denies pain at the moment, plan of care explained, COVID 19 confirmed isolation maintained,  rounding hourly.

## 2019-02-11 NOTE — Progress Notes (Signed)
Discharge instruction explained and given to the pt, all questions answered, prescription given, pt instructed to cover her mouth to isolate herself from her family, IV removed and all belongings given and pt left the unit in stable condition.

## 2019-02-11 NOTE — Plan of Care (Signed)
Problem: Safety  Goal: Patient will be free from injury during hospitalization  Flowsheets (Taken 02/11/2019 0349)  Patient will be free from injury during hospitalization : Provide and maintain safe environment     Problem: Pain  Goal: Pain at adequate level as identified by patient  Flowsheets (Taken 02/11/2019 0349)  Pain at adequate level as identified by patient: Assess pain on admission, during daily assessment and/or before any "as needed" intervention(s)     Problem: Psychosocial and Spiritual Needs  Goal: Demonstrates ability to cope with hospitalization/illness  Flowsheets (Taken 02/11/2019 0349)  Demonstrates ability to cope with hospitalizations/illness: Provide quiet environment

## 2019-02-11 NOTE — Progress Notes (Signed)
[facility]  PROGRESS NOTE      Patient: Kelsey Miles  Date: 02/11/2019   LOS: 2 Days  Admission Date: 02/08/2019   MRN: [id number]  Attending: Karlton Lemon  Please contact me on perfectserve       ASSESSMENT/PLAN     Kelsey Miles is a 38 y.o. female with medical history of uncontrolled diabetes,  hypertension and hyperlipidemia who presented with abdominal pain and chest pain.    Interval Summary:   5/3 unrelieved headache is causing her lightheadedness and nausea/vomiting -sat-ing great on RA, nausea improving  Pneumonia   COVID pneumonia  Covid + 5/2   IV fluids  Switched to levofloxacin IV until 5/7      DM  -hgba1c 12.7  -home insulin - has home lantus flex pen  -reports recent polyuria, polydipsia  Insulin sliding scale  -refer to metabolic clinic     Uncontrolled Hypertension  Increase home lisinopril    UTI  On levaquin and fluconazole because of yeast in the urine    Microcytic anemia  -iron panel    Hypokalemia  -check Mg   -replete K, repeat     Analgesia: acetaminophen    Nutrition: Diabetic diet    DVT Prophylaxis: Enoxaparin SQ       Code Status: Full    DISPO: Per CM note. Patient lives in one room bedroom apartment with husband and children. She sleeps on a couch. Patient reports she can remain in the sole bedroom and husband and daughter can stay in the other portion of the household. Patient should be discharged on oral levofloxacin to cover UTI/PNA       SUBJECTIVE     Kelsey Miles states she feels better    MEDICATIONS     Current Facility-Administered Medications   Medication Dose Route Frequency    enoxaparin  40 mg Subcutaneous Daily    famotidine  20 mg Oral Q12H [facility]    Or    famotidine  20 mg Intravenous Q12H [facility]    fluconazole  100 mg Oral Q24H [facility]    insulin lispro  1-3 Units Subcutaneous QHS    insulin lispro  1-5 Units Subcutaneous TID AC    levoFLOXacin  500 mg Oral Daily    lisinopril  10 mg Oral Daily       PHYSICAL EXAM     Vitals:    02/11/19 0824   BP:  (!) 146/92   Pulse: 85   Resp: 16   Temp: 98.6 F (37 C)   SpO2: 99%       Temperature: Temp  Min: 97.3 F (36.3 C)  Max: 98.6 F (37 C)  Pulse: Pulse  Min: 78  Max: 88  Respiratory: Resp  Min: 16  Max: 18  Non-Invasive BP: BP  Min: 132/88  Max: 164/113  Pulse Oximetry SpO2  Min: 97 %  Max: 99 %    Intake and Output Summary (Last 24 hours) at Date Time    Intake/Output Summary (Last 24 hours) at 02/11/2019 0859  Last data filed at 02/10/2019 0932  Gross per 24 hour   Intake 358 ml   Output    Net 358 ml         GEN APPEARANCE: Normal;  A&OX3  HEENT: PERLA; EOMI; Conjunctiva Clear  NECK: Supple; No bruits  CVS: RRR, S1, S2; No M/G/R  LUNGS: CTAB; No Wheezes; No Rhonchi: No rales  ABD: Soft; No TTP; +  Normoactive BS  EXT: No edema; Pulses 2+ and intact  Skin exam:  pink  NEURO: CN 2-12 intact; No Focal neurological deficits  CAP REFILL:  Normal  MENTAL STATUS:  Normal        LABS     Recent Labs   Lab 02/11/19  0424 02/10/19  0437 02/08/19  0743   WBC 5.55 5.07 7.23   RBC 4.39 4.06 4.23   Hgb 10.5* 9.8* 10.2*   Hematocrit 33.4* 31.4* 32.2*   MCV 76.1* 77.3* 76.1*   Platelets 509* 462* 434*       Recent Labs   Lab 02/11/19  0424 02/10/19  0437 02/08/19  0743 02/08/19  0058   Sodium 139 140 135* 135*   Potassium 3.4* 3.4* 3.7 3.8   Chloride 103 107 102 99*   CO2 27 27 24 24    BUN 7.0 6.0* 10.0 12.0   Creatinine 0.6 0.6 0.6 0.7   Glucose 194* 212* 315* 382*   Calcium 8.8 7.6* 7.8* 9.1   Magnesium  --   --  1.4*  --        Recent Labs   Lab 02/08/19  0743 02/08/19  0058   ALT 15 16   AST (SGOT) 14 17   Bilirubin, Total 0.2 0.4   Albumin 2.9* 3.2*   Alkaline Phosphatase 117* 144*       Recent Labs   Lab 02/09/19  0501 02/08/19  0743 02/08/19  0058   Creatine Kinase (CK)  --  37  --    Troponin I <0.01 <0.01   <0.01 <0.01       Recent Labs   Lab 02/08/19  0743   PT INR 1.0   PT 13.0   PTT 28       Microbiology Results     Procedure Component Value Units Date/Time    Blood Culture Aerobic/Anaerobic #1 [[id number]] Collected:   02/08/19 0520    Specimen:  Arm from Blood, Venipuncture Updated:  02/08/19 0902    Narrative:       1 BLUE+1 PURPLE    Blood Culture Aerobic/Anaerobic #2 [[id number]] Collected:  02/08/19 0520    Specimen:  Arm from Blood, Venipuncture Updated:  02/08/19 0902    Narrative:       1 BLUE+1 PURPLE    COVID-19 (SARS-CoV-2) [place]) [[id number]]  (Abnormal) Collected:  02/08/19 0543    Specimen:  Nasopharyngeal Swab from Nasopharynx Updated:  02/08/19 2137     SARS-CoV-2 Specimen Source Nasopharyngeal     SARS CoV 2 Overall Result Detected     Comment: Critical COVAB results given to RN [id number].  Readback confirmed by GV at  21:37  02/08/2019  Test performed using the Abbott m2000 SARS-CoV-2 EUA Test.  Please see Fact Sheets for patients and providers located at:  http://www.rice.biz/  This test is for the qualitative detection of SARS-CoV-2(COVID-19)  nucleic acid.  Viral nucleic acids may persist in vivo, independent of viability.  Detection of viral nucleic acid does not imply the presence of  infectious virus, or that virus nucleic acid is the cause of  clinical symptoms.  Test performance has not been established for  immunocompromised patients or patients without signs and symptoms of  respiratory infection. Negative results do not preclude SARS-CoV-2  infection and should not be used as the sole basis for patient  management decisions.  Invalid results may be due to inhibiting substances in the specimen  and recollection should occur.  Narrative:       o Collect and clearly label specimen type:  o PREFERRED-Upper respiratory specimen: One Nasopharyngeal  Swab in Transport Media.  o Hand deliver to laboratory ASAP    Urine culture [[id number]] Collected:  02/08/19 0109    Specimen:  Urine, Clean Catch Updated:  02/09/19 0714    Narrative:       Replace urinary catheter prior to obtaining the urine culture  if it has been in place for greater than or equal to 14  days:->N/A No Foley  Indications  for Urine Culture:->Suprapubic Pain/Tenderness or  Dysuria  ORDER#: [id number]                                    ORDERED BY: Lysle Pearl  SOURCE: Urine, Clean Catch                           COLLECTED:  02/08/19 01:09  ANTIBIOTICS AT COLL.:                                RECEIVED :  02/08/19 07:06  Culture Urine                              FINAL       02/09/19 07:14  02/09/19   1,000 - 9,000 CFU/ML of normal urogenital or skin microbiota, no further             work             RADIOLOGY     Upon my review:    Signed,  Karlton Lemon  8:59 AM 02/11/2019

## 2019-02-11 NOTE — Discharge Summary (Signed)
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 Right pneumonia Nicki Reaper, MD 02/08/2019 4:12 AM    US Abdomen Limited Ruq    Result Date: 02/08/2019  No significant abnormality identified. Nicki Reaper, MD 02/08/2019 4:53 AM    Xr Chest  Ap Portable    Result Date: 02/08/2019   No acute cardiopulmonary changes are seen. Vella Raring, MD 02/08/2019 1:58 AM     Echo Results     None        Discharge Medications:     Medication List      START taking these medications    benzonatate 100 MG capsule  Commonly known as:  TESSALON  Take 1 capsule (100 mg total) by mouth 3 (three) times daily as needed for Cough     fluconazole 100 MG tablet  Commonly known as:  DIFLUCAN  Take 1 tablet (100 mg total) by mouth every 24 hours for 3 days  Start taking on:  Feb 12, 2019     guaiFENesin-codeine 100-10 MG/5ML syrup  Commonly known as:  ROBITUSSIN AC  Take 5 mLs by mouth every 6 (six) hours as needed for Cough or Congestion     levoFLOXacin 500 MG tablet  Commonly known as:  LEVAQUIN  Take 1 tablet (500 mg total) by mouth daily for 3 days  Start taking on:  Feb 12, 2019     lisinopril 20 MG  tablet  Commonly known as:  PRINIVIL,ZESTRIL  Take 1 tablet (20 mg total) by mouth daily  Start taking on:  Feb 12, 2019        CONTINUE taking these medications    fenofibrate 145 MG tablet  Commonly known as:  Tricor  Take 1 tablet (145 mg total) by mouth daily     insulin NPH-regular 70-30 MIXTURE (70-30) 100 UNIT/ML injection  Commonly known as:  NovoLIN 70/30  Inject 21 Units into the skin 2 (two) times daily before meals     INSULIN SYRINGE .5CC/31GX5/16" 31G X 5/16" 0.5 ML Misc  Use 2x a day     metFORMIN 1000 MG tablet  Commonly known as:  GLUCOPHAGE  Take 1 tablet (1,000 mg total) by mouth 2 (two) times daily with meals     ondansetron 4 MG disintegrating tablet  Commonly known as:  ZOFRAN-ODT  Take 1 tablet (4 mg total) by mouth every 6 (six) hours as needed for Nausea  vitamin D (ergocalciferol) 50000 UNIT Caps  Commonly known as:  DRISDOL  Take 1 capsule (50,000 Units total) by mouth once a week           Where to Get Your Medications      These medications were sent to [facility] [address], [place] - [address]  [address] [place] [address]    Phone:  [phone]    benzonatate 100 MG capsule     You can get these medications from any pharmacy    Bring a paper prescription for each of these medications   fluconazole 100 MG tablet   guaiFENesin-codeine 100-10 MG/5ML syrup   levoFLOXacin 500 MG tablet   lisinopril 20 MG tablet   ondansetron 4 MG disintegrating tablet             Hospital Course   Presentation History / Hospital Course (2 Days):  38 y.o. female with medical history of uncontrolled diabetes,  hypertension and hyperlipidemia who presented with abdominal pain and chest pain.    Patient reports around 9 PM she suddenly developed headache followed by chest pain and abdominal pain, associated with nausea, vomiting and palpitation.  Patient checked her blood glucose and found 450.  She also checks her blood pressure and found systolic blood pressure  195.  Her brother gave his blood pressure medication, patient does not know name of the medication.  Patient denies fever, chills, cold/flulike symptoms, sick contact with COVID, cough, shortness of breath, change in urinary/bowel habit    COVID pneumonia  Acute hypoxic respiratory failure   Covid + 5/2   Home isolation discussed - pt will stay in bedroom, and rest of family will remain in rest of home  Switched to levofloxacin IV until 5/7 for UTI  Self prone prn  Patient was weaned to room air prior to [place]   pt's PMD Dr. Madilyn Fireman' office was called to notify  - message left at [facility], left my cell for Dr. Madilyn Fireman   I faxed Health department covid positive status     DM  -hgba1c 12.7-reports recent polyuria, polydipsia  -Patient was cont on home insulin  And Insulin sliding scale  -resume outpatient metformin, insulin, and refer to metabolic clinic     Uncontrolled Hypertension  Increase home lisinopril to 20mg  from 10mg      UTI  On levaquin     Vaginal Candidiasis    fluconazole because of yeast in the urine    Microcytic anemia  -iron panel    Hypokalemia  -due to poor po intake  -check Mg - low and was repleted with IV  -repleted K     RECOMMENDATIONS:    - Patient was informed of abnormal and incidental imaging findings during hospitalization, and advised to review this information with their  medical provider.             Best Practices   Was the patient admitted with either a CHF Exacerbation or Pneumonia? NO     Progress Note/Physical Exam at Discharge     Subjective: Patient reported feeling well, and is ready for discharge.    Vitals:    02/10/19 1900 02/10/19 2300 02/11/19 0400 02/11/19 0824   BP: (!) 164/113 (!) 146/91 132/88 (!) 146/92   Pulse:  82 78 85   Resp: 18 17 17 16    Temp: 98.1 F (36.7 C) 98.1 F (36.7 C) 98 F (36.7 C) 98.6 F (37 C)   TempSrc: Oral  Oral Oral    SpO2: 98% 99% 97% 99%   Weight:           General: NAD  HEENT: sclera anicteric, OP: Clear, MMM  Cardiovascular:  RRR, no m/r/g  Lungs: CTAB, no w/r/r  Abdomen: soft, +BS, NT/ND, no masses, no g/r  Extremities: Warm and well perfused  Skin: no rashes or lesions noted on exposed surfaces  Neuro: Answers questions appropriately, responds to commands       Diagnostics     Labs/Studies Pending at Discharge:    Last Labs   Recent Labs   Lab 02/11/19  0424 02/10/19  0437 02/08/19  0743   WBC 5.55 5.07 7.23   RBC 4.39 4.06 4.23   Hgb 10.5* 9.8* 10.2*   Hematocrit 33.4* 31.4* 32.2*   MCV 76.1* 77.3* 76.1*   Platelets 509* 462* 434*       Recent Labs   Lab 02/11/19  0424 02/10/19  0437 02/08/19  0743 02/08/19  0058   Sodium 139 140 135* 135*   Potassium 3.4* 3.4* 3.7 3.8   Chloride 103 107 102 99*   CO2 27 27 24 24    BUN 7.0 6.0* 10.0 12.0   Creatinine 0.6 0.6 0.6 0.7   Glucose 194* 212* 315* 382*   Calcium 8.8 7.6* 7.8* 9.1   Magnesium  --   --  1.4*  --        Microbiology Results     Procedure Component Value Units Date/Time    Blood Culture Aerobic/Anaerobic #1 [[id number]] Collected:  02/08/19 0520    Specimen:  Arm from Blood, Venipuncture Updated:  02/11/19 0921    Narrative:       ORDER#: [id number]                                    ORDERED BY: Lysle Pearl  SOURCE: Blood, Venipuncture arm                      COLLECTED:  02/08/19 05:20  ANTIBIOTICS AT COLL.:                                RECEIVED :  02/08/19 09:02  Culture Blood Aerobic and Anaerobic        PRELIM      02/11/19 09:21  02/09/19   No Growth after 1 day/s of incubation.  02/10/19   No Growth after 2 day/s of incubation.  02/11/19   No Growth after 3 day/s of incubation.      Blood Culture Aerobic/Anaerobic #2 [[id number]] Collected:  02/08/19 0520    Specimen:  Arm from Blood, Venipuncture Updated:  02/11/19 0921    Narrative:       ORDER#: [id number]                                    ORDERED BY: Lysle Pearl  SOURCE: Blood, Venipuncture arm                      COLLECTED:  02/08/19 05:20  ANTIBIOTICS AT COLL.:  RECEIVED :   02/08/19 09:02  Culture Blood Aerobic and Anaerobic        PRELIM      02/11/19 09:21  02/09/19   No Growth after 1 day/s of incubation.  02/10/19   No Growth after 2 day/s of incubation.  02/11/19   No Growth after 3 day/s of incubation.      COVID-19 (SARS-CoV-2) [id number]) [[id number]]  (Abnormal) Collected:  02/10/19 1011    Specimen:  Nasopharyngeal Swab from Nasopharynx Updated:  02/11/19 0834     SARS-CoV-2 Specimen Source Nasopharyngeal     SARS CoV 2 Overall Result Detected     Comment: [id number] faxed positive results to [facility]. Fax received confirmed by  [id number]  Call read back verified to [id number] on  02/11/2019  08:34 by [facility]  Test performed using the Abbott m2000 SARS-CoV-2 EUA Test.  Please see Fact Sheets for patients and providers located at:  http://www.rice.biz/  This test is for the qualitative detection of SARS-CoV-2(COVID-19)  nucleic acid.  Viral nucleic acids may persist in vivo, independent of viability.  Detection of viral nucleic acid does not imply the presence of  infectious virus, or that virus nucleic acid is the cause of  clinical symptoms.  Test performance has not been established for  immunocompromised patients or patients without signs and symptoms of  respiratory infection. Negative results do not preclude SARS-CoV-2  infection and should not be used as the sole basis for patient  management decisions.  Invalid results may be due to inhibiting substances in the specimen  and recollection should occur.         Narrative:       o Collect and clearly label specimen type:  o PREFERRED-Upper respiratory specimen: One Nasopharyngeal  Swab in Transport Media.  o Hand deliver to laboratory ASAP    COVID-19 (SARS-CoV-2) [place]) [[id number]]  (Abnormal) Collected:  02/08/19 0543    Specimen:  Nasopharyngeal Swab from Nasopharynx Updated:  02/08/19 2137     SARS-CoV-2 Specimen Source Nasopharyngeal     SARS CoV 2 Overall Result Detected     Comment: Critical COVAB results given to RN  [id number].  Readback confirmed by GV at  21:37  02/08/2019  Test performed using the Abbott m2000 SARS-CoV-2 EUA Test.  Please see Fact Sheets for patients and providers located at:  http://www.rice.biz/  This test is for the qualitative detection of SARS-CoV-2(COVID-19)  nucleic acid.  Viral nucleic acids may persist in vivo, independent of viability.  Detection of viral nucleic acid does not imply the presence of  infectious virus, or that virus nucleic acid is the cause of  clinical symptoms.  Test performance has not been established for  immunocompromised patients or patients without signs and symptoms of  respiratory infection. Negative results do not preclude SARS-CoV-2  infection and should not be used as the sole basis for patient  management decisions.  Invalid results may be due to inhibiting substances in the specimen  and recollection should occur.         Narrative:       o Collect and clearly label specimen type:  o PREFERRED-Upper respiratory specimen: One Nasopharyngeal  Swab in Transport Media.  o Hand deliver to laboratory ASAP    Urine culture [[id number]] Collected:  02/08/19 0109    Specimen:  Urine, Clean Catch Updated:  02/09/19 2951    Narrative:       Replace urinary catheter prior to obtaining the urine culture  if it has  been in place for greater than or equal to 14  days:->N/A No Foley  Indications for Urine Culture:->Suprapubic Pain/Tenderness or  Dysuria  ORDER#: [id number]                                    ORDERED BY: Lysle Pearl  SOURCE: Urine, Clean Catch                           COLLECTED:  02/08/19 01:09  ANTIBIOTICS AT COLL.:                                RECEIVED :  02/08/19 07:06  Culture Urine                              FINAL       02/09/19 07:14  02/09/19   1,000 - 9,000 CFU/ML of normal urogenital or skin microbiota, no further             work             Patient Instructions   Discharge Diet: Heart healthy  Discharge Activity: As tolerated   LABS/TESTING recommended after discharge    Follow Up Appointment:  Follow-up Information     Sherol Dade, MD. Schedule an appointment as soon as possible for a visit.    Specialty:  Family Medicine  Contact information:  [address] [place] [address]  [phone]             [facility] [place]. Call.    Specialty:  Diabetes Services  Contact information:  [address]  [place] [address]  [phone]                  Time spent examining patient, discussing with patient/family regarding hospital course, chart review, reconciling medications and discharge planning 33 minutes.  This patient was examined by me on , the day of discharge.    Bartolo Darter    9:23 AM 02/11/2019

## 2019-11-29 ENCOUNTER — Emergency Department
Admission: EM | Admit: 2019-11-29 | Discharge: 2019-11-29 | Discharge status: Against Medical Advice | Payer: Self-pay | Attending: Emergency Medicine | Admitting: Emergency Medicine

## 2019-11-29 ENCOUNTER — Other Ambulatory Visit: Payer: Self-pay

## 2019-11-29 ENCOUNTER — Emergency Department: Payer: Self-pay

## 2019-11-29 DIAGNOSIS — R079 Chest pain, unspecified: Secondary | ICD-10-CM

## 2019-11-29 DIAGNOSIS — E785 Hyperlipidemia, unspecified: Secondary | ICD-10-CM | POA: Insufficient documentation

## 2019-11-29 DIAGNOSIS — R519 Headache, unspecified: Secondary | ICD-10-CM | POA: Insufficient documentation

## 2019-11-29 DIAGNOSIS — Z794 Long term (current) use of insulin: Secondary | ICD-10-CM | POA: Insufficient documentation

## 2019-11-29 DIAGNOSIS — R1010 Upper abdominal pain, unspecified: Secondary | ICD-10-CM | POA: Insufficient documentation

## 2019-11-29 DIAGNOSIS — I10 Essential (primary) hypertension: Secondary | ICD-10-CM | POA: Insufficient documentation

## 2019-11-29 DIAGNOSIS — E119 Type 2 diabetes mellitus without complications: Secondary | ICD-10-CM | POA: Insufficient documentation

## 2019-11-29 LAB — COMPREHENSIVE METABOLIC PANEL
ALT: 31 U/L (ref 0–55)
AST (SGOT): 22 U/L (ref 5–34)
Albumin/Globulin Ratio: 1.1 (ref 0.9–2.2)
Albumin: 3.8 g/dL (ref 3.5–5.0)
Alkaline Phosphatase: 102 U/L (ref 37–106)
Anion Gap: 8 (ref 5.0–15.0)
BUN: 15 mg/dL (ref 7.0–19.0)
Bilirubin, Total: 0.4 mg/dL (ref 0.2–1.2)
CO2: 22 mEq/L (ref 22–29)
Calcium: 9.1 mg/dL (ref 8.5–10.5)
Chloride: 106 mEq/L (ref 100–111)
Creatinine: 0.7 mg/dL (ref 0.6–1.0)
Globulin: 3.4 g/dL (ref 2.0–3.6)
Glucose: 245 mg/dL — ABNORMAL HIGH (ref 70–100)
Potassium: 4.5 mEq/L (ref 3.5–5.1)
Protein, Total: 7.2 g/dL (ref 6.0–8.3)
Sodium: 136 mEq/L (ref 136–145)

## 2019-11-29 LAB — URINALYSIS, REFLEX TO MICROSCOPIC EXAM IF INDICATED
Bilirubin, UA: NEGATIVE
Blood, UA: NEGATIVE
Glucose, UA: 500 — AB
Ketones UA: NEGATIVE
Nitrite, UA: NEGATIVE
Protein, UR: 30 — AB
Specific Gravity UA: 1.015 (ref 1.001–1.035)
Urine pH: 6 (ref 5.0–8.0)
Urobilinogen, UA: NEGATIVE mg/dL (ref 0.2–2.0)

## 2019-11-29 LAB — CBC AND DIFFERENTIAL
Absolute NRBC: 0 10*3/uL (ref 0.00–0.00)
Basophils Absolute Automated: 0.07 10*3/uL (ref 0.00–0.08)
Basophils Automated: 0.8 %
Eosinophils Absolute Automated: 0.39 10*3/uL (ref 0.00–0.44)
Eosinophils Automated: 4.3 %
Hematocrit: 36.8 % (ref 34.7–43.7)
Hgb: 12.3 g/dL (ref 11.4–14.8)
Immature Granulocytes Absolute: 0.02 10*3/uL (ref 0.00–0.07)
Immature Granulocytes: 0.2 %
Lymphocytes Absolute Automated: 2.73 10*3/uL (ref 0.42–3.22)
Lymphocytes Automated: 30.3 %
MCH: 27.4 pg (ref 25.1–33.5)
MCHC: 33.4 g/dL (ref 31.5–35.8)
MCV: 82 fL (ref 78.0–96.0)
MPV: 9.3 fL (ref 8.9–12.5)
Monocytes Absolute Automated: 0.67 10*3/uL (ref 0.21–0.85)
Monocytes: 7.4 %
Neutrophils Absolute: 5.13 10*3/uL (ref 1.10–6.33)
Neutrophils: 57 %
Nucleated RBC: 0 /100 WBC (ref 0.0–0.0)
Platelets: 327 10*3/uL (ref 142–346)
RBC: 4.49 10*6/uL (ref 3.90–5.10)
RDW: 16 % — ABNORMAL HIGH (ref 11–15)
WBC: 9.01 10*3/uL (ref 3.10–9.50)

## 2019-11-29 LAB — HCG QUANTITATIVE: hCG, Quant.: <1.2

## 2019-11-29 LAB — ECG 12-LEAD
Atrial Rate: 105 {beats}/min
P Axis: 51 degrees
P-R Interval: 140 ms
Q-T Interval: 350 ms
QRS Duration: 82 ms
QTC Calculation (Bezet): 462 ms
R Axis: -78 degrees
T Axis: 49 degrees
Ventricular Rate: 105 {beats}/min

## 2019-11-29 LAB — GFR: EGFR: >60

## 2019-11-29 LAB — TROPONIN I: Troponin I: <0.01 ng/mL (ref 0.00–0.05)

## 2019-11-29 LAB — GLUCOSE WHOLE BLOOD - POCT: Whole Blood Glucose POCT: 228 mg/dL — ABNORMAL HIGH (ref 70–100)

## 2019-11-29 LAB — HEMOLYSIS INDEX: Hemolysis Index: 8 (ref 0–18)

## 2019-11-29 MED ORDER — SODIUM CHLORIDE 0.9 % IV MBP
INTRAVENOUS | Status: AC
Start: 2019-11-29 — End: 2019-11-29
  Administered 2019-11-29: 11:00:00 50 mL
  Filled 2019-11-29: qty 50

## 2019-11-29 MED ORDER — KETOROLAC TROMETHAMINE 30 MG/ML IJ SOLN
30.00 mg | Freq: Once | INTRAMUSCULAR | Status: AC
Start: 2019-11-29 — End: 2019-11-29
  Administered 2019-11-29: 11:00:00 30 mg via INTRAVENOUS
  Filled 2019-11-29: qty 1

## 2019-11-29 MED ORDER — METOCLOPRAMIDE HCL 5 MG/ML IJ SOLN
10.00 mg | Freq: Once | INTRAMUSCULAR | Status: AC
Start: 2019-11-29 — End: 2019-11-29
  Administered 2019-11-29: 11:00:00 10 mg via INTRAVENOUS
  Filled 2019-11-29: qty 2

## 2019-11-29 MED ORDER — ONDANSETRON HCL 4 MG/2ML IJ SOLN
4.00 mg | Freq: Once | INTRAMUSCULAR | Status: AC
Start: 2019-11-29 — End: 2019-11-29
  Administered 2019-11-29: 11:00:00 4 mg via INTRAVENOUS
  Filled 2019-11-29: qty 2

## 2019-11-29 MED ORDER — IOHEXOL 350 MG/ML IV SOLN
100.00 mL | Freq: Once | INTRAVENOUS | Status: AC | PRN
Start: 2019-11-29 — End: 2019-11-29
  Administered 2019-11-29: 100 mL via INTRAVENOUS

## 2019-11-29 NOTE — ED Triage Notes (Signed)
Patient presents to the ED with Frontal Headache x 15 days. +Photobia, Denies Nausea/Vomiting. Seen at [facility] for same complaints a week ago.

## 2019-11-29 NOTE — ED Notes (Signed)
Patient states she needs to leave.  States theres something going on at home but they wont tell her over the phone.  Requested she stay to speak with provider but patient states she needs to leave immediately.

## 2019-11-29 NOTE — ED Provider Notes (Signed)
EMERGENCY DEPARTMENT HISTORY AND PHYSICAL EXAM        Date: 11/29/2019  Patient Name: Kelsey Miles  Attending Physician: Elliot Cousin, MD MD  Advanced Practice Provider: Shanda Bumps PA-C    History of Presenting Illness       History Provided By: Patient    Chief Complaint:  Chief Complaint   Patient presents with    Headache         Additional History: Kelsey Miles is a 39 y.o. female with PMH HTN, DM, HLD presenting to the ED with c/o continued frontal headache x 2 weeks. Aggravated by light. Patient states she was seen at [facility] ER for same ciomplaint a week ago. Pt had normal labs and a negative CT head.  Denies any home remedies. No head injury, visual changes, gait disturbance, nausea, vomiting, cp, sob, abd pain. Denies h/o migraines, or prev similar head aches in the past. Denies headache being worst ever.         PCP: Sherol Dade, MD  SPECIALISTS:    No current facility-administered medications for this encounter.      Current Outpatient Medications   Medication Sig Dispense Refill    insulin NPH-regular 70-30 MIXTURE (NOVOLIN 70/30) (70-30) 100 UNIT/ML injection Inject 21 Units into the skin 2 (two) times daily before meals 10 mL 2    Insulin Syringe-Needle U-100 (INSULIN SYRINGE .5CC/31GX5/16") 31G X 5/16" 0.5 ML Misc Use 2x a day 100 each 0    lisinopril (PRINIVIL,ZESTRIL) 20 MG tablet Take 1 tablet (20 mg total) by mouth daily 30 tablet 0    metFORMIN (GLUCOPHAGE) 1000 MG tablet Take 1 tablet (1,000 mg total) by mouth 2 (two) times daily with meals 60 tablet 2    vitamin D, ergocalciferol, (DRISDOL) 50000 UNIT Cap Take 1 capsule (50,000 Units total) by mouth once a week 8 capsule 0       Past History     Past Medical History:  Past Medical History:   Diagnosis Date    Diabetes     Gout     Hypertension     UTI (urinary tract infection)        Past Surgical History:  Past Surgical History:   Procedure Laterality Date    CESAREAN SECTION  x2    CESAREAN SECTION         Family  History:  Family History   Problem Relation Age of Onset    Diabetes Father     Diabetes Brother     Diabetes Sister        Social History:  Social History     Socioeconomic History    Marital status: Married     Spouse name: None    Number of children: None    Years of education: None    Highest education level: None   Occupational History    None   Social Engineer, site strain: None    Food insecurity     Worry: None     Inability: None    Transportation needs     Medical: None     Non-medical: None   Tobacco Use    Smoking status: Never Smoker    Smokeless tobacco: Never Used   Substance and Sexual Activity    Alcohol use: No    Drug use: No    Sexual activity: Yes     Partners: Male   Lifestyle    Physical activity  Days per week: None     Minutes per session: None    Stress: None   Relationships    Social connections     Talks on phone: None     Gets together: None     Attends religious service: None     Active member of club or organization: None     Attends meetings of clubs or organizations: None     Relationship status: None    Intimate partner violence     Fear of current or ex partner: None     Emotionally abused: None     Physically abused: None     Forced sexual activity: None   Other Topics Concern    None   Social History Narrative    ** Merged History Encounter **            Allergies:  No Known Allergies    Review of Systems     Review of Systems   Constitutional: Negative for chills and fever.   HENT: Negative for ear pain and facial swelling.    Eyes: Negative for pain and discharge.   Respiratory: Negative for cough and shortness of breath.    Cardiovascular: Negative for chest pain and palpitations.   Gastrointestinal: Negative for abdominal pain, diarrhea, nausea and vomiting.   Genitourinary: Negative for dysuria and flank pain.   Musculoskeletal: Negative for arthralgias.   Neurological: Positive for headaches. Negative for dizziness.     Physical Exam      Vitals:    11/29/19 0837 11/29/19 1030 11/29/19 1100 11/29/19 1130   BP: (!) 158/91 125/84 131/84 135/82   Pulse: (!) 113 (!) 101 (!) 101 (!) 103   Resp: 18 18 16     Temp: 97.8 F (36.6 C)      TempSrc: Oral      SpO2: 100% 99% 99% 100%   Weight: 55.3 kg      Height: 4\' 11"  (1.499 m)          Physical Exam   Constitutional: She appears well-developed and well-nourished. No distress.   HENT:   Head: Normocephalic and atraumatic.   Eyes: Conjunctivae are normal. Right eye exhibits no discharge. Left eye exhibits no discharge. No scleral icterus.   Neck: Normal range of motion.   Cardiovascular: Intact distal pulses.   Pulmonary/Chest: Effort normal. No respiratory distress.   Abdominal: Soft. There is no abdominal tenderness. There is no rebound and no guarding.   Musculoskeletal: Normal range of motion.   Neurological: She is alert.   Skin: She is not diaphoretic.   Nursing note and vitals reviewed.    Diagnostic Study Results     Labs -     Results     Procedure Component Value Units Date/Time    UA, Reflex to Microscopic (pts 3 + yrs) [[id number]]  (Abnormal) Collected: 11/29/19 1148    Specimen: Urine Updated: 11/29/19 1209     Urine Type Clean Catch     Color, UA Yellow     Clarity, UA Sl Cloudy     Specific Gravity UA 1.015     Urine pH 6.0     Leukocyte Esterase, UA Large     Nitrite, UA Negative     Protein, UR 30     Glucose, UA 500     Ketones UA Negative     Urobilinogen, UA Negative mg/dL      Bilirubin, UA Negative     Blood, UA Negative  RBC, UA 3 - 5 /hpf      WBC, UA TNTC /hpf      Squamous Epithelial Cells, Urine 6 - 10 /hpf      Urine Mucus Present    Beta HCG, Quant, Serum [[id number]] Collected: 11/29/19 0934     Updated: 11/29/19 1006     hCG, Quant. <1.2    Troponin I [[id number]] Collected: 11/29/19 0934    Specimen: Blood Updated: 11/29/19 1006     Troponin I <0.01 ng/mL     Comprehensive metabolic panel [[id number]]  (Abnormal) Collected: 11/29/19 0934    Specimen: Blood Updated: 11/29/19  0956     Glucose 245 mg/dL      BUN 08.6 mg/dL      Creatinine 0.7 mg/dL      Sodium 578 mEq/L      Potassium 4.5 mEq/L      Chloride 106 mEq/L      CO2 22 mEq/L      Calcium 9.1 mg/dL      Protein, Total 7.2 g/dL      Albumin 3.8 g/dL      AST (SGOT) 22 U/L      ALT 31 U/L      Alkaline Phosphatase 102 U/L      Bilirubin, Total 0.4 mg/dL      Globulin 3.4 g/dL      Albumin/Globulin Ratio 1.1     Anion Gap 8.0    Hemolysis index [[id number]] Collected: 11/29/19 0934     Updated: 11/29/19 0956     Hemolysis Index 8    GFR [[id number]] Collected: 11/29/19 0934     Updated: 11/29/19 0956     EGFR >60.0    CBC and differential [[id number]]  (Abnormal) Collected: 11/29/19 0934    Specimen: Blood Updated: 11/29/19 0942     WBC 9.01 x10 3/uL      Hgb 12.3 g/dL      Hematocrit 03.4 %      Platelets 327 x10 3/uL      RBC 4.49 x10 6/uL      MCV 82.0 fL      MCH 27.4 pg      MCHC 33.4 g/dL      RDW 16 %      MPV 9.3 fL      Neutrophils 57.0 %      Lymphocytes Automated 30.3 %      Monocytes 7.4 %      Eosinophils Automated 4.3 %      Basophils Automated 0.8 %      Immature Granulocytes 0.2 %      Nucleated RBC 0.0 /100 WBC      Neutrophils Absolute 5.13 x10 3/uL      Lymphocytes Absolute Automated 2.73 x10 3/uL      Monocytes Absolute Automated 0.67 x10 3/uL      Eosinophils Absolute Automated 0.39 x10 3/uL      Basophils Absolute Automated 0.07 x10 3/uL      Immature Granulocytes Absolute 0.02 x10 3/uL      Absolute NRBC 0.00 x10 3/uL     Glucose Whole Blood - POCT [[id number]]  (Abnormal) Collected: 11/29/19 0932     Updated: 11/29/19 0934     Whole Blood Glucose POCT 228 mg/dL           Radiologic Studies -   Radiology Results (24 Hour)     Procedure Component Value Units Date/Time    CT Abd/Pelvis with IV Contrast only [[id number]] Collected:  11/29/19 1200    Order Status: Completed Updated: 11/29/19 1209    Narrative:      CLINICAL HISTORY:Nausea and vomiting    COMPARISON:02/13/2018    TECHNIQUE:Multiple axial CT images  through the abdomen pelvis are  obtained following administration of intravenous contrast. 100 cc of  Omnipaque was missed.    A combination of a automatic exposure control, adjustment of the mA  and/or kV  according to patient size and/or use of iterative  reconstruction technique was utilized.    FINDINGS:    Lung bases: No basilar pleural or pericardial effusion. Lung bases are  clear.    Liver: Fatty liver. No focal hepatic lesion.    Gallbladder: Normal    Spleen: Normal    Adrenal glands: Normal    Pancreas: Normal    Kidneys: Stable cortical scarring in both kidneys. No hydronephrosis.  The kidneys enhance symmetrically.    Bladder: Bladder wall thickening measuring up to 1 cm. Some of these may  relate to underdistention, however a cystitis cannot be excluded.    Uterus and ovaries: Normal appearance of uterus and ovaries. There is a  1.6 cm corpus luteum cyst in the right ovary.    Bowel: No evidence of bowel obstruction or bowel dilatation. Appendix is  normal.    Great vessels: There are atherosclerotic calcific changes of the  abdominal aorta, more than expected for patient's age. There is a short  segment of the abdominal aorta which is relatively small in diameter and  tortuous. No abdominal aortic aneurysm. The major mesenteric vessels are  patent.    Lymph nodes: No abdominal lymphadenopathy. No pelvic lymphadenopathy.    Peritoneum: No free fluid or free air.    Bones: No destructive osseous lesions.      Impression:        1. New bladder wall thickening. Some of this may relate to  underdistention but a cystitis cannot be excluded. Correlate clinically.  2.  Fatty liver.  3. Relatively stable atherosclerotic calcific changes of the abdominal  aorta, more than expected for age. Additionally, there is a stable short  segment of the abdominal aorta which is small in caliber and tortuous.  This could relate to an underlying vasculitis. The major mesenteric  vasculature is grossly patent.    Wyatt Portela, MD   11/29/2019 12:06 PM      .    Medical Decision Making   I am the first provider for this patient.    I reviewed the vital signs, available nursing notes, past medical history, past surgical history, family history and social history.    Vital Signs-Reviewed the patient's vital signs.     Patient Vitals for the past 12 hrs:   BP Temp Pulse Resp   11/29/19 1130 135/82 -- (!) 103 --   11/29/19 1100 131/84 -- (!) 101 16   11/29/19 1030 125/84 -- (!) 101 18   11/29/19 0837 (!) 158/91 97.8 F (36.6 C) (!) 113 18         Pulse Oximetry Analysis - Normal 100% on RA    Personal Protective Equipment (PPE)     Face Shield, Gloves, Goggles, N95 and Surgical / Bouffant Cap       Procedures:   Procedures    Old Medical Records: Old medical records, Nursing notes.     ED Course:   ED Course as of Nov 28 1214   Thu Nov 29, 2019   1046 Pt later reports she  has also been having upper abd pain, nausea and vomiting x 5 days.    [SS]   1213 Per nurse, pt had an emergency with daughter and eloped from treatment room. Pt not in room, attempted to called patient at phone number on file which is a wrong number.     [SS]      ED Course User Index  [SS] Shan Valdes, Della Goo, [place]         Provider Notes:    39 y.o. female presenting c/o frontal headache x 2 weeks associated with photophobia. No head trauma, no sudden onset, no rigidity, neg brudinski. Normal head CT done a week ago. CBC and CMP unremarkable for any severe leukocytosis, severe anemia or renal insufficiency. Patient eloped from ED prior to Imaging results and UA results. UA + leukocytes. CT + stable atherosclerotic calcific changes of the abdominal aorta, stable short segment of the abdominal aorta which is small in caliber and tortuous.      D/w Elliot Cousin, MD        Diagnosis     Clinical Impression:   1. Acute nonintractable headache, unspecified headache type    2. Pain of upper abdomen        Treatment Plan:   ED Disposition     ED Disposition Condition  Date/Time Comment    Left prior to treatment complete  Thu Nov 29, 2019 12:14 PM             _______________________________    CHART OWNERSHIPCelesta Aver, PA-C, am the primary clinician of record.  _______________________________     Randall An, PA  11/29/19 1934

## 2020-04-09 NOTE — Consults (Addendum)
OPHTHALMOLOGY CONSULT NOTE   OD: right eye   OS: left eye   OU: both eyes       ASSESSMENT/ PLAN  39 y.o.YOF with history of poorly controlled HTN, DM2 here with vision loss secondary to proliferative retinopathy and vitreous hemorrhage.     1.   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  Follow up with Dr. Selina Cooley or [facility] upon discharge [phone]).  Reviewed it is essential to control A1c << 7% and hypertension to avoid worsening of ischemic retinopathy which will lead to permanent blindness.      2.   Vitreous hemorrhage OS:  Pre-retinal hemorrhages without significant dispersed heme obscuring view of the retina.  This is a result of #1.  Please elevated head of bed 30-45 degrees to allow hemorrhage to settle.  Avoid anticoagulants if possible.  No forward bending or straining to decrease the degree of intraocular dispersion of hemorrhage.    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 Reviewed that this develops over weeks to months, and often surgery is required to remove fibrotic tissue and repair the retina.   No acute intervention required.    Hanley Ben, MD  Ophthalmology    ------------------------------------    CC: decreased vision OS    HPI:  Melinda [name] is a 39 y.o. F with history of poorly controlled HTN, DM2 here with acute vision loss.  She has no eye pain and previously wore glasses.  She has had difficulty with finances and was unable to replace them.  She has poor vision OU but the left eye worsened recently.       Past Medical History:   Diagnosis Date   . COVID-19 virus detected     positive tests on 02/08/2019 and 02/10/2019 [facility] [place])   . Gout    . Hypertension    . Iron deficiency anemia    . Nonadherence to medication    . Pneumonia due to COVID-19 virus 01/2019    [facility]   .  Type 2 diabetes mellitus (CMS/HCC)     poorly controlled   . Vaginal yeast infection         Exam:  Vision near uncorrected:   OD: 20/40  OS:  Hand Motion     Pupils:  No RAPD, reactive  Extraocular movement : Full OU, Orthophoric, orthotropic  Confrontational Visual fields: Full OD, decreased 360 OS  Pressure :  (Normal range 10-21)  OD 19  OS 18     Anterior segment  External:  Normal  Eyelids:  Normal  Cornea clear without opacity or abrasion  Conjunctiva: White and quiet OU  Sclera: No underlying redness OU  Iris: normal   Lens: Clear OU  Vitreous: clear OU     Posterior segment: Dilated with tropicamide at 9am  OD c/d 0.3, macula with dot blot hemorrhages, cotton wool spots, neovascularization along the arcades, periphery with 4 quadrants of venous beading and DBH/Flame hemorrhage  OS optic nerve not visible, large superotemporal area of fibrosis with tractional retinal detachment, preretinal boat hemorrhages, macula with flame heme/DBH and CWS.  Nasal retina visible and attached.

## 2020-06-25 ENCOUNTER — Encounter: Payer: Self-pay | Admitting: Ophthalmology

## 2020-06-25 NOTE — Pre-Procedure Instructions (Addendum)
Surgical Risk Level : (Low, Intermediate, High)  o low     Surgeon Testing Requirements:  o None-confirmed with patient numerous times, no preop clearance or testing was ordered by surgeon      Anesthesia Guideline Requirements:  o none     Specialist Notes / Test Results / Records Requested:  o none     Recent Hospitalization / ED Visit:   o None      Future Plan / Upcoming Appts:   o none     Labs/Testing @ [facility] PSS:   o none     Email Sent To Patient:   o Provided PSS email [email or web address] or phone [phone] to patient/family member   o [email or web address]  o Emailed copy of hospital campus map        NPO Instructions given to patient:    o NPO instructions reviewed: Clear liquids up to 2 hours prior to arrival time, then NPO. No solid food 8 hours prior to scheduled procedure time. Examples of clear liquids include water, apple juice, sports drinks such as Gatorade, coffee or tea without milk or cream. Sugar or sweetener may be added  o Fasting Requirements per Preoperative Fasting Guidelines for Elective Surgeries and Procedures Requiring Anesthesia Policy Revised 02/2020.  Ingested material Fasting requirement   Clear liquids/Ice Chips 2 hours prior to arrival time   Breast milk 4 hours prior to scheduled procedure time   Infant formula 6 hours prior to scheduled procedure time   Non-human milk 8 hours prior to scheduled procedure time   Solid food 8 hours prior to scheduled procedure time        Faxes Sent To:   o Pharmacy- DOS medication orders FAXED   o Per H&P list, do not Nurse, adult, office to send automatically      Epic Orders Entered:   o Preop Nursing Anesthesia Orders  o IV/LR/urine/glucose      Other Outlying information gathered that does not fit anywhere else  o PSS interview conducted with use of Spanish interpreter   o Patient was made aware the ride home from surgery must be a responsible adult, at least 24 yrs of age.  Patient verbalized understanding     Chart Room Handoff  for Further  Follow-up if Applicable:  o None     Visitor Restriction Guidelines per [facility] as of 04/28/20:  All visitors must adhere to the following:    Exhibit no COVID-19 symptoms    Age 64+ (internally exceptions can be made by administrative team as needed, e.g., siblings, end of life situations, etc.)    In keeping with the CDCs current guidance, regardless of vaccination status, everyone in a healthcare facility must wear a mask covering their mouth and nose the entire time they are in the facility. Visitors who fail to wear a mask properly will be asked to leave.    The following face coverings cannot be worn at any [place] location: gaiter style masks, bandanas or vented masks.    No visitors are allowed for patients with suspected or confirmed COVID-19, except in end-of-life situations.  Hospital Inpatient:   Visitation hours: 9 a.m. - 6:30 p.m. daily    Adult patients may have two visitors, in addition to a [employer] Person (DSP), if applicable    Pediatric patients may have two parents/guardians at bedside 24/7. For pediatric outpatient areas, only parents/guardians may visit  Outpatient/Ambulatory Surgery:    Adult patients may have one visitor, in  addition to their [employer] Person (DSP) on the day of surgery.    No family members will be allowed into Phase 1 recovery areas. Physicians will call contact person to give report of procedure.    Family / visitor will be called by PACU staff  to review discharge instructions via phone and answer any questions.     Advise to call surgeon if need to cancel surgery arises.      Patient verbalized understanding and acceptance of above information.

## 2020-06-27 NOTE — Pre-Procedure Instructions (Signed)
Day Before Surgery Confirmation Call    Spoke to: PT with Spanish Interpreter [phone]  Left Message: N/A    Confirmed surgery date, arrival time, and location.   Arrival: 1100  Surgery: 1300    NPO instructions reviewed: Clear liquids up to 2 hours prior to arrival time, then NPO. No solid food 8 hours prior to scheduled procedure time. Examples of clear liquids include water, apple juice, sports drinks such as Gatorade, coffee or tea without milk or cream. Sugar or sweetener may be added    Fasting Requirements per Preoperative Fasting Guidelines for Elective Surgeries and Procedures Requiring Anesthesia Policy Revised 02/2020  Ingested material Fasting requirement   Clear liquids/Ice Chips 2 hours prior to arrival time   Breast milk 4 hours prior to scheduled procedure time   Infant formula 6 hours prior to scheduled procedure time   Non-human milk 8 hours prior to scheduled procedure time   Solid food 8 hours prior to scheduled procedure time       Ambulatory Screening Tool:   Patient denies themselves or anyone in immediate family currently experiencing fever or any symptoms of acute respiratory illness (cough, or shortness of breath).  Patient denies travel outside of the [place] in the last month.   Patient denies being in close contact with a confirmed COVID19 patient.   Patient does not reside in a nursing home or long-term care facility.   Patient denies recent visit to ED, hospitalization or PCP visit for acute illness since PSS RN interview.     Patient instructed that if you or your family does develop new symptoms, to contact surgeons office immediately, prior to arrival at the hospital.     Visitor Restriction Guidelines per [facility] as of 04/28/20:    All visitors must adhere to the following:    Exhibit no COVID-19 symptoms    Age 33+ (internally exceptions can be made by administrative team as needed, e.g., siblings, end of life situations, etc.)    In keeping with the CDCs current  guidance, regardless of vaccination status, everyone in a healthcare facility must wear a mask covering their mouth and nose the entire time they are in the facility. Visitors who fail to wear a mask properly will be asked to leave.    The following face coverings cannot be worn at any [place] location: gaiter style masks, bandanas or vented masks.    No visitors are allowed for patients with suspected or confirmed COVID-19, except in end-of-life situations.    Hospital Inpatient:   Visitation hours: 9 a.m. - 6:30 p.m. daily    Adult patients may have two visitors, in addition to a [employer] Person (DSP), if applicable    Pediatric patients may have two parents/guardians at bedside 24/7. For pediatric outpatient areas, only parents/guardians may visit  Outpatient/Ambulatory Surgery:    Adult patients may have one visitor, in addition to their Designated Support Person (DSP) on the day of surgery.    No family members will be allowed into Phase 1 recovery areas. Physicians will call contact person to give report of procedure.    Family / visitor will be called by PACU staff  to review discharge instructions via phone and answer any questions.       Advise to call surgeon if need to cancel surgery arises.     Patient verbalized understanding and acceptance of above information.  If a voicemail is left for the patient and any of the COVID19 ambulatory screening  questions are positive, patient is asked to call PSS ASAP.

## 2020-06-30 ENCOUNTER — Encounter
Admission: RE | Disposition: A | Discharge status: Home / Self Care | Payer: Self-pay | Source: Ambulatory Visit | Attending: Ophthalmology

## 2020-06-30 ENCOUNTER — Ambulatory Visit: Payer: Charity | Admitting: Anesthesiology

## 2020-06-30 ENCOUNTER — Ambulatory Visit
Admission: RE | Admit: 2020-06-30 | Discharge: 2020-06-30 | Disposition: A | Discharge status: Home / Self Care | Payer: Charity | Source: Ambulatory Visit | Attending: Ophthalmology | Admitting: Ophthalmology

## 2020-06-30 DIAGNOSIS — H35052 Retinal neovascularization, unspecified, left eye: Secondary | ICD-10-CM | POA: Insufficient documentation

## 2020-06-30 DIAGNOSIS — E785 Hyperlipidemia, unspecified: Secondary | ICD-10-CM | POA: Insufficient documentation

## 2020-06-30 DIAGNOSIS — M109 Gout, unspecified: Secondary | ICD-10-CM | POA: Insufficient documentation

## 2020-06-30 DIAGNOSIS — I10 Essential (primary) hypertension: Secondary | ICD-10-CM | POA: Insufficient documentation

## 2020-06-30 DIAGNOSIS — E113522 Type 2 diabetes mellitus with proliferative diabetic retinopathy with traction retinal detachment involving the macula, left eye: Secondary | ICD-10-CM | POA: Insufficient documentation

## 2020-06-30 DIAGNOSIS — H4312 Vitreous hemorrhage, left eye: Secondary | ICD-10-CM | POA: Insufficient documentation

## 2020-06-30 HISTORY — DX: Type 2 diabetes mellitus without complications: E11.9

## 2020-06-30 HISTORY — PX: VITRECTOMY, MEMBRANE PEELING: SHX5717

## 2020-06-30 HISTORY — DX: Hyperlipidemia, unspecified: E78.5

## 2020-06-30 HISTORY — DX: Headache, unspecified: R51.9

## 2020-06-30 HISTORY — DX: Unspecified visual disturbance: H53.9

## 2020-06-30 HISTORY — PX: VITRECTOMY, 25 GAUGE, MECHANICAL, PARS PLANA: SHX5707

## 2020-06-30 HISTORY — PX: VITRECTOMY, LASER/SILICONE OIL: SHX5715

## 2020-06-30 LAB — GLUCOSE WHOLE BLOOD - POCT
Whole Blood Glucose POCT: 114 mg/dL — ABNORMAL HIGH (ref 70–100)
Whole Blood Glucose POCT: 110 mg/dL — ABNORMAL HIGH (ref 70–100)

## 2020-06-30 LAB — URINE HCG QUALITATIVE: Urine HCG Qualitative: NEGATIVE

## 2020-06-30 SURGERY — VITRECTOMY 25 GAUGE, MECHANICAL, PARS PLANA
Anesthesia: Anesthesia MAC / Sedation | Site: Eye | Laterality: Left | Wound class: Clean

## 2020-06-30 MED ORDER — EPINEPHRINE HCL 1 MG/ML IJ SOLN (WRAP)
Status: AC
Start: 2020-06-30 — End: ?
  Filled 2020-06-30: qty 1

## 2020-06-30 MED ORDER — SILICONE OIL 1000
Status: AC
Start: 2020-06-30 — End: ?
  Filled 2020-06-30: qty 8.5

## 2020-06-30 MED ORDER — DEXAMETHASONE SOD PHOSPHATE PF 10 MG/ML IJ SOLN
INTRAMUSCULAR | Status: AC
Start: 2020-06-30 — End: ?
  Filled 2020-06-30: qty 1

## 2020-06-30 MED ORDER — BSS IO SOLN
INTRAOCULAR | Status: DC | PRN
Start: 2020-06-30 — End: 2020-06-30
  Administered 2020-06-30: 20 mL via TOPICAL

## 2020-06-30 MED ORDER — LABETALOL HCL 5 MG/ML IV SOLN (WRAP)
INTRAVENOUS | Status: AC
Start: 2020-06-30 — End: ?
  Filled 2020-06-30: qty 20

## 2020-06-30 MED ORDER — LABETALOL HCL 5 MG/ML IV SOLN (WRAP)
INTRAVENOUS | Status: DC | PRN
Start: 2020-06-30 — End: 2020-06-30
  Administered 2020-06-30 (×3): 5 mg via INTRAVENOUS

## 2020-06-30 MED ORDER — TETRACAINE HCL 0.5 % OP SOLN
1.0000 [drp] | Freq: Once | OPHTHALMIC | Status: AC
Start: 2020-06-30 — End: 2020-06-30
  Administered 2020-06-30: 12:00:00 1 [drp] via OPHTHALMIC

## 2020-06-30 MED ORDER — ONDANSETRON HCL 4 MG/2ML IJ SOLN
4.0000 mg | Freq: Once | INTRAMUSCULAR | Status: AC | PRN
Start: 2020-06-30 — End: 2020-06-30
  Administered 2020-06-30: 16:00:00 4 mg via INTRAVENOUS
  Filled 2020-06-30: qty 2

## 2020-06-30 MED ORDER — TETRACAINE HCL 0.5 % OP SOLN
OPHTHALMIC | Status: AC
Start: 2020-06-30 — End: ?
  Filled 2020-06-30: qty 4

## 2020-06-30 MED ORDER — NEOMYCIN-POLYMYXIN-DEXAMETH 3.5-10000-0.1 OP OINT
TOPICAL_OINTMENT | OPHTHALMIC | Status: AC
Start: 2020-06-30 — End: ?
  Filled 2020-06-30: qty 3.5

## 2020-06-30 MED ORDER — HYDRALAZINE HCL 20 MG/ML IJ SOLN
10.0000 mg | Freq: Once | INTRAMUSCULAR | Status: DC
Start: 2020-06-30 — End: 2020-06-30

## 2020-06-30 MED ORDER — FENTANYL CITRATE (PF) 50 MCG/ML IJ SOLN (WRAP)
25.0000 ug | INTRAMUSCULAR | Status: AC | PRN
Start: 2020-06-30 — End: 2020-06-30
  Administered 2020-06-30 (×4): 25 ug via INTRAVENOUS
  Filled 2020-06-30: qty 2

## 2020-06-30 MED ORDER — HYDROMORPHONE HCL 0.5 MG/0.5 ML IJ SOLN
0.5000 mg | INTRAMUSCULAR | Status: DC | PRN
Start: 2020-06-30 — End: 2020-06-30
  Administered 2020-06-30: 0.5 mg via INTRAVENOUS
  Filled 2020-06-30: qty 0.5

## 2020-06-30 MED ORDER — HYPROMELLOSE PF 1.7 % OP SOSY
PREFILLED_SYRINGE | OPHTHALMIC | Status: AC
Start: 2020-06-30 — End: ?
  Filled 2020-06-30: qty 2

## 2020-06-30 MED ORDER — MIDAZOLAM HCL 1 MG/ML IJ SOLN (WRAP)
INTRAMUSCULAR | Status: AC
Start: 2020-06-30 — End: ?
  Filled 2020-06-30: qty 2

## 2020-06-30 MED ORDER — PHENYLEPHRINE HCL 2.5 % OP SOLN
1.0000 [drp] | OPHTHALMIC | Status: AC
Start: 2020-06-30 — End: 2020-06-30
  Administered 2020-06-30 (×3): 1 [drp] via OPHTHALMIC

## 2020-06-30 MED ORDER — LIDOCAINE HCL (PF) 2 % IJ SOLN
INTRAMUSCULAR | Status: DC | PRN
Start: 2020-06-30 — End: 2020-06-30
  Administered 2020-06-30: 1 mL

## 2020-06-30 MED ORDER — STERILE WATER FOR IRRIGATION IR SOLN
Status: DC | PRN
Start: 2020-06-30 — End: 2020-06-30
  Administered 2020-06-30: 250 mL

## 2020-06-30 MED ORDER — TROPICAMIDE 1 % OP SOLN
1.0000 [drp] | OPHTHALMIC | Status: AC
Start: 2020-06-30 — End: 2020-06-30
  Administered 2020-06-30 (×3): 1 [drp] via OPHTHALMIC

## 2020-06-30 MED ORDER — OFLOXACIN 0.3 % OP SOLN
OPHTHALMIC | Status: AC
Start: 2020-06-30 — End: ?
  Filled 2020-06-30: qty 5

## 2020-06-30 MED ORDER — PROMETHAZINE HCL 25 MG/ML IJ SOLN
6.2500 mg | Freq: Once | INTRAMUSCULAR | Status: AC | PRN
Start: 2020-06-30 — End: 2020-06-30
  Administered 2020-06-30: 17:00:00 6.25 mg via INTRAVENOUS
  Filled 2020-06-30: qty 1

## 2020-06-30 MED ORDER — OFLOXACIN 0.3 % OP SOLN
OPHTHALMIC | Status: DC | PRN
Start: 2020-06-30 — End: 2020-06-30
  Administered 2020-06-30: 1 [drp]

## 2020-06-30 MED ORDER — PREDNISOLONE ACETATE 1 % OP SUSP
OPHTHALMIC | Status: AC
Start: 2020-06-30 — End: ?
  Filled 2020-06-30: qty 5

## 2020-06-30 MED ORDER — SODIUM CHLORIDE (PF) 0.9 % IJ SOLN
INTRAMUSCULAR | Status: AC
Start: 2020-06-30 — End: ?
  Filled 2020-06-30: qty 10

## 2020-06-30 MED ORDER — HYPROMELLOSE PF 1.7 % OP SOSY
PREFILLED_SYRINGE | OPHTHALMIC | Status: DC | PRN
Start: 2020-06-30 — End: 2020-06-30
  Administered 2020-06-30: 1.5 mL via TOPICAL

## 2020-06-30 MED ORDER — LIDOCAINE HCL (PF) 2 % IJ SOLN
INTRAMUSCULAR | Status: AC
Start: 2020-06-30 — End: ?
  Filled 2020-06-30: qty 5

## 2020-06-30 MED ORDER — DEXAMETHASONE SOD PHOSPHATE PF 10 MG/ML IJ SOLN
INTRAMUSCULAR | Status: DC | PRN
Start: 2020-06-30 — End: 2020-06-30
  Administered 2020-06-30: 5 mg

## 2020-06-30 MED ORDER — ATROPINE SULFATE 1 % OP SOLN
OPHTHALMIC | Status: DC | PRN
Start: 2020-06-30 — End: 2020-06-30
  Administered 2020-06-30: 1 [drp] via OPHTHALMIC

## 2020-06-30 MED ORDER — EPINEPHRINE HCL 1 MG/ML IJ SOLN (WRAP)
Status: DC | PRN
Start: 2020-06-30 — End: 2020-06-30
  Administered 2020-06-30: .5 mg

## 2020-06-30 MED ORDER — NEOMYCIN-POLYMYXIN-DEXAMETH 3.5-10000-0.1 OP OINT
TOPICAL_OINTMENT | OPHTHALMIC | Status: DC | PRN
Start: 2020-06-30 — End: 2020-06-30
  Administered 2020-06-30: 1 g via OPHTHALMIC

## 2020-06-30 MED ORDER — BALANCED SALT IO SOLN
INTRAOCULAR | Status: DC | PRN
Start: 2020-06-30 — End: 2020-06-30
  Administered 2020-06-30: 500 mL via INTRAOCULAR

## 2020-06-30 MED ORDER — ATROPINE SULFATE 1 % OP SOLN
1.0000 [drp] | OPHTHALMIC | Status: AC
Start: 2020-06-30 — End: 2020-06-30
  Administered 2020-06-30 (×3): 1 [drp] via OPHTHALMIC

## 2020-06-30 MED ORDER — PHENYLEPHRINE HCL 2.5 % OP SOLN
OPHTHALMIC | Status: AC
Start: 2020-06-30 — End: ?
  Filled 2020-06-30: qty 1

## 2020-06-30 MED ORDER — FENTANYL CITRATE (PF) 50 MCG/ML IJ SOLN (WRAP)
INTRAMUSCULAR | Status: AC
Start: 2020-06-30 — End: ?
  Filled 2020-06-30: qty 2

## 2020-06-30 MED ORDER — BUPIVACAINE HCL (PF) 0.75 % IJ SOLN
INTRAMUSCULAR | Status: AC
Start: 2020-06-30 — End: ?
  Filled 2020-06-30: qty 10

## 2020-06-30 MED ORDER — POVIDONE-IODINE 5 % OP SOLN
OPHTHALMIC | Status: DC | PRN
Start: 2020-06-30 — End: 2020-06-30
  Administered 2020-06-30: 2 [drp] via OPHTHALMIC

## 2020-06-30 MED ORDER — ATROPINE SULFATE 1 % OP SOLN
OPHTHALMIC | Status: AC
Start: 2020-06-30 — End: ?
  Filled 2020-06-30: qty 5

## 2020-06-30 MED ORDER — FENTANYL CITRATE (PF) 50 MCG/ML IJ SOLN (WRAP)
INTRAMUSCULAR | Status: DC | PRN
Start: 2020-06-30 — End: 2020-06-30
  Administered 2020-06-30: 50 ug via INTRAVENOUS
  Administered 2020-06-30 (×2): 25 ug via INTRAVENOUS

## 2020-06-30 MED ORDER — MIDAZOLAM HCL 1 MG/ML IJ SOLN (WRAP)
INTRAMUSCULAR | Status: DC | PRN
Start: 2020-06-30 — End: 2020-06-30
  Administered 2020-06-30 (×2): .5 mg via INTRAVENOUS
  Administered 2020-06-30: 1 mg via INTRAVENOUS

## 2020-06-30 MED ORDER — ACETAMINOPHEN 500 MG PO TABS
1000.0000 mg | ORAL_TABLET | Freq: Once | ORAL | Status: AC | PRN
Start: 2020-06-30 — End: 2020-06-30
  Administered 2020-06-30: 1000 mg via ORAL
  Filled 2020-06-30: qty 2

## 2020-06-30 MED ORDER — DEXTROSE 50 % IV SOLN
INTRAVENOUS | Status: AC
Start: 2020-06-30 — End: ?
  Filled 2020-06-30: qty 50

## 2020-06-30 MED ORDER — CEFAZOLIN SODIUM 1 G IJ SOLR
INTRAMUSCULAR | Status: AC
Start: 2020-06-30 — End: ?
  Filled 2020-06-30: qty 1000

## 2020-06-30 MED ORDER — BUPIVACAINE HCL (PF) 0.75 % IJ SOLN
INTRAMUSCULAR | Status: DC | PRN
Start: 2020-06-30 — End: 2020-06-30
  Administered 2020-06-30: 1 mL

## 2020-06-30 MED ORDER — PREDNISOLONE ACETATE 1 % OP SUSP
OPHTHALMIC | Status: DC | PRN
Start: 2020-06-30 — End: 2020-06-30
  Administered 2020-06-30: 1 [drp] via OPHTHALMIC

## 2020-06-30 MED ORDER — TETRACAINE HCL 0.5 % OP SOLN
OPHTHALMIC | Status: DC | PRN
Start: 2020-06-30 — End: 2020-06-30
  Administered 2020-06-30: 2 [drp] via OPHTHALMIC

## 2020-06-30 MED ORDER — CEFAZOLIN SODIUM 1 G IJ SOLR
INTRAMUSCULAR | Status: DC | PRN
Start: 2020-06-30 — End: 2020-06-30
  Administered 2020-06-30: 50 mg via SUBCONJUNCTIVAL

## 2020-06-30 MED ORDER — TROPICAMIDE 1 % OP SOLN
OPHTHALMIC | Status: AC
Start: 2020-06-30 — End: ?
  Filled 2020-06-30: qty 15

## 2020-06-30 MED ORDER — DEXTROSE 50 % IV SOLN
INTRAVENOUS | Status: DC | PRN
Start: 2020-06-30 — End: 2020-06-30
  Administered 2020-06-30: 3 mL via INTRAVENOUS

## 2020-06-30 MED ORDER — LACTATED RINGERS IV SOLN
INTRAVENOUS | Status: DC
Start: 2020-06-30 — End: 2020-06-30

## 2020-06-30 SURGICAL SUPPLY — 53 items
CANNULA OPHTHALMIC SHORT SOFT TIP OD25 (Cannula) ×1
CANNULA OPHTHALMIC SHORT SOFT TIP OD25 GA ODSEC.8 MM GRIESHABER (Cannula) IMPLANT
CANNULA OPTH SHRT GRSHBR 25GA .8MM STRL (Cannula) ×1
CONSTELLATION AUTO GAS FILL PK (Opthamology Supply) ×1
DRAPE EQP SHWR CUP (Drape) ×1
DRAPE EQUIPMENT SHOWER CUP (Drape) ×1 IMPLANT
DRESSING TRANSPARENT L2 3/4 IN X W2 3/8 (Dressing) ×1
DRESSING TRANSPARENT L2 3/4 IN X W2 3/8 IN POLYURETHANE ADHESIVE (Dressing) ×1 IMPLANT
DRESSING TRNS PU STD TGDRM 2.75INX2 3/8 (Dressing) ×1
EYE SHIELD SOFT BLUE RUBBER (Personal Protection) ×1
GLOVE SRG 6 BGL SNSR LTX STRL PF TXTR (Glove) ×1
GLOVE SURGICAL 6 BIOGEL SENSOR POWDER (Glove) ×1
GLOVE SURGICAL 6 BIOGEL SENSOR POWDER FREE TEXTURE BEAD CUFF STRAW (Glove) ×1 IMPLANT
GOWN SRG LG SMARTGOWN LF STRL LVL 4 (Gown) ×4
GOWN SRGCL LG LEVEL 4 BRTHBL STRL LF DSPSBL SMARTGOWN (Gown) ×2 IMPLANT
LABEL MED LF STRL PK CSTM RTNA DISP (Other) ×1
LABEL MEDICAL PACK CUSTOM RETINA (Other) ×1
LABEL MEDICAL PACK CUSTOM RETINA CL22746 CUSTOM MED LABEL PACK (Other) ×1 IMPLANT
PACK VITRECTOMY CUSTOM 25G (Pack) ×2
PACK VITRECTOMY CUSTOM 25G AS1325816 (Pack) ×1 IMPLANT
PACK VITRECTOMY GAS FILL SYRINGE (Opthamology Supply) ×1
PACK VITRECTOMY GAS FILL SYRINGE CONSTELLATION ENGAUGE V-LOCITY (Opthamology Supply) IMPLANT
PACK VITRECTOMY SYRINGE NEEDLE CANNULA (Opthamology Supply) ×1
PACK VITRECTOMY SYRINGE NEEDLE CANNULA CONSTELLATION ENGAUGE RUBBER (Opthamology Supply) IMPLANT
PACK VITRTM RBR 10CC CNSTL ENGAUGE 20GA (Opthamology Supply) ×1
PROBE HEMOSTATIC ERASER 150 D TAPER OD25 GA BIPOLAR HOLLOW CORE TARGET (Cautery) IMPLANT
PROBE HMERSR 150D TPR WTFLD HMERSR 25GA (Cautery) ×2
PROBE LASER ILLUM FLX CRVD 25G (Laser Supplies) ×1
PROBE LASER OD25 GA ILLUMINATE PIK RADIO (Procedure Accessories) ×1
PROBE LASER OD25 GA ILLUMINATE PIK RADIO FREQUENCY IDENTIFICATION (Procedure Accessories) IMPLANT
PROBE LASER OD25 GA L2 IN ILLUMINATE (Laser Supplies) ×1
PROBE LASER OD25 GA L2 IN ILLUMINATE CURVE TIP LARGE TACTILE INDICATOR (Laser Supplies) IMPLANT
PROBE LSR 25GA ILLUMINATE PIK RAD FRQ ID (Procedure Accessories) ×1
RESTRAINT EXTREMITY L36 IN REGULAR SOFT (Patient Supply) ×1
RESTRAINT EXTREMITY L36 IN REGULAR SOFT QUILT CUFF QUICK RELEASE (Patient Supply) ×1 IMPLANT
RESTRAINT XTRMT REG PRCR 36IN SFT QUILT (Patient Supply) ×1
SHIELD OPHTHALMIC UNIVERSAL FLEXIBLE (Personal Protection) ×1
SHIELD OPHTHALMIC UNIVERSAL FLEXIBLE EDGE SOFGUARD THERMOPLASTIC (Personal Protection) ×1 IMPLANT
SOLUTION ANFG ISOPRPNL FM DVN DFGR FGOUT (Procedure Accessories) ×1
SOLUTION ANTIFOG NONTOXIC NONFLAMMABLE (Procedure Accessories) ×1
SOLUTION ANTIFOG NONTOXIC NONFLAMMABLE PAD DEVON ISOPROPANOL FOAM (Procedure Accessories) IMPLANT
SUTURE ABS 8-0 TG140-8 VCL MICROPOINT 12 (Suture) ×1
SUTURE COATED VICRYL 8-0 TG140-8 L12 IN (Suture) ×1
SUTURE COATED VICRYL 8-0 TG140-8 L12 IN 2 ARM BRAID COATED VIOLET (Suture) IMPLANT
SYRINGE MED 10ML RW REG BVL LL PRCSNGL (Syringes, Needles) ×4 IMPLANT
TAPE SRG PPR STD MCPR 10YDX1IN LF HPOAL (Tape) ×1
TAPE SURGICAL L10 YD X W1 IN STANDARD (Tape) ×1
TAPE SURGICAL L10 YD X W1 IN STANDARD HYPOALLERGENIC BREATHABLE (Tape) ×1 IMPLANT
TIP PHACO PLYMR 360D GRSH RV DSP 25GA (Procedure Accessories) ×1
TIP PHACOEMULSIFICATION GRIESHABER (Procedure Accessories) ×1
TIP PHACOEMULSIFICATION GRIESHABER REVOLUTION DSP OD25 GA BACK FLUSH (Procedure Accessories) ×1 IMPLANT
WATER STERILE PLASTIC POUR BOTTLE 250 ML (Irrigation Solutions) ×1 IMPLANT
WATER STRL 250ML LF PLS PR BTL (Irrigation Solutions) ×1

## 2020-06-30 NOTE — Op Note (Signed)
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  Decision to proceed with retinal re-attachment to attempt to rehabilitate vision and prevent further vision loss.    Operative Notes:   After discussion of the risks, benefits and alternatives of the procedure, written informed consent was obtained. The surgical eye was marked as the left eye in the pre-operative area. The patient was brought to the operating room. A timeout was performed and confirmed by all personnel. The patient was placed under monitored anesthesia care as deemed appropriate by the anesthetist, who was present during the entire case.     The left eye was prepped and draped in usual sterile fashion for ophthalmic surgery. A lid speculum was placed. A conjunctival cut-down was made with [place]ott scissors and a sub-Tenon's block of 2% lidocaine and 0.75 % bupivicaine was given in the inferonasal quadrant without complication. With a 25 gauge vitrectomy system, trochars were used to place trans-scleral cannulas at 4 mm from the limbus in the superotemporal, inferotemporal and superonasal quadrants. A primed infusion line was secured to the inferotemporal cannula with the tip directly visualized in the vitreous cavity prior to the  initiation of infusion. Using a wide-field viewing system and endo-illuminating light pipe, examination of the posterior segment revealed a dense, de-hemoglobinized vitreous hemorrhage.     A core vitrectomy was performed. As the vitreous hemorrhage was cleared, the retina was visualized. A traction retinal detachment was seen extending nasally and superiorly from 7:00-1:00 at the equator, with multiple points of traction stemming from neovascularization of the disc and elsewhere. Antero-posterior traction was released 360-degrees. Creation of a posterior vitreous detachment was deferred due to the tight adherence of the posterior hyaloid to the epiretinal membranes and retinal surface. 360-degree scleral depression was performed and no retinal breaks were seen. The vitreous cutter was used to carefully segment and delaminate fibrovascular fronds from the retinal surface. Epiretinal membranes were peeled using the MaxGrip forceps. Bimanual technique was used with a lighted pick. The posterior hyaloid and epiretinal membrane complex was noted to be extremely thick and adherent. Although care was taken to avoid iatrogenic breaks wherever possible, small stretch breaks occurred at the superonasal and inferonasal periphery. All traction was relieved. Intraocular bleeding was controlled with a combination of diathermy and transient elevation of the intraocular pressure (not exceeding 2 minutes). Fluid air exchange was performed with a soft tip aspiration cannula, with drainage of subretinal fluid through the breaks. The retina flattened.     Endolaser was applied to surround all breaks and in pan-retinal fashion to the peripheral retina. A total of ~1700 laser spots was applied. The eye was then filled with 1000 cs silicone oil to the level of the iris plane. The cannulas  were sequentially removed and all sclerotomies were sutured with 7-0 Vicryl suture. The intraocular pressure was confirmed to be physiologic by  palpation.     Subconjunctival injections of cefazolin and dexamethasone were given. The ocular surface was rinsed with 5% betadine, followed by BSS. The lid speculum and drapes were removed. Atropine drop and Maxitrol ointment were applied and the eye was patched and shielded. Anesthesia was reversed and the patient was taken to the recovery area in stable condition, having tolerated the procedure without complications. Appropriate post-operative instructions and follow up appointment were given.         Estimated Blood Loss:   < 1cc    Specimens:   none    Complications:   * No complications entered in OR log *      Signed by: Reuel Derby, MD

## 2020-06-30 NOTE — Addendum Note (Signed)
Addendum  created 06/30/20 1534 by Donald Siva, MD    Attestation recorded in Intraprocedure, Clinical Note Signed, Intraprocedure Attestations filed

## 2020-06-30 NOTE — Anesthesia Postprocedure Evaluation (Signed)
Anesthesia Post Evaluation    Patient: Kelsey Miles    Procedure(s):  VITRECTOMY, 25 GAUGE, MECHANICAL  VITRECTOMY, MEMBRANE PEELING  VITRECTOMY, LASER/SILICONE OIL    Anesthesia type: MAC    Last Vitals:   Vitals Value Taken Time   BP 158/92 06/30/20 1520   Temp 36.2 C (97.2 F) 06/30/20 1512   Pulse 101 06/30/20 1520   Resp 16 06/30/20 1520   SpO2 100 % 06/30/20 1520                 Anesthesia Post Evaluation:     Patient Evaluated: PACU  Patient Participation: complete - patient participated  Level of Consciousness: awake  Pain Score: 1  Pain Management: adequate    Airway Patency: patent    Anesthetic complications: No      PONV Status: none    Cardiovascular status: acceptable  Respiratory status: acceptable  Hydration status: acceptable        Signed by: Donald Siva, 06/30/2020 3:33 PM

## 2020-06-30 NOTE — Discharge Instructions (Signed)
Qu es la vitrectoma?    Es un tipo de [place] ocular (ojo) que se realiza para tratar problemas de la retina y el humor vtreo. Durante la operacin, su cirujano quita el humor vtreo y lo reemplaza por otra solucin.  Qu son la retina y el humor vtreo?  La retina es una capa de clulas que est en la parte de atrs de su ojo. Estas clulas usan luz para enviar informacin visual a su cerebro. El humor vtreo es una sustancia tipo gel que rellenasu ojo. Normalmente es trasparente para que la luz pueda atravesar el ojo y Environmental health practitioner a la retina. Ciertos problemas pueden provocar que sangre y desechos bloqueen la luz. El tejido cicatricial (cicatrices) en su humor vtreo tambin puede desplazar o romper su retina. Todo esto puede daar su visin.  Por qu se realiza una vitrectoma?  Esta operacin es un tipo de tratamiento para cualquiera de los siguientes problemas oculares:   Retinopata diabtica   Desprendimiento de retina   Hemorragia vtrea   Infeccin dentro de su ojo   Lesin grave en el ojo   Una rotura en el centro de su retina (mcula)   Una arruga en el centro de su retina   Ciertos problemas despus de una ciruga de cataratas  Todos estos problemas pueden causar prdida de la visin. Si no se la tratan, algunos [employer] llevar a la ceguera. En algunos casos, una vitrectoma puede devolver la visin perdida. Usted podra necesitar que le hagan una vitrectoma de emergencia si tuvo alguna lesin ocular. En otros casos, su oftalmlogo (mdico especialista en ojos) programar su vitrectoma por adelantado. A veces, este procedimiento se hace para tratar un desprendimiento de retina. Al quitar el humor vtreo, se llega con ms facilidad a su retina y la tensin sobre esta disminuye.  Cmo se realiza la vitrectoma?  Se hace un corte pequeo en la parte blanca del ojo (esclertica). El humor vtreo que tiene apariencia de gel se retira, junto con cualquier tejido cicatricial u Scientist, physiological. Se realizan las reparaciones del ojo que sean necesarias. Luego se reemplaza el humor vtreo por otro tipo de lquido. Se puede reemplazar con aceite de silicona o solucin salina. O puede remplazarse por aire o gas. Esto depender del problema que est siendo tratado.  Riesgos de la vitrectoma  Kelsey Miles ciruga tiene [employer]. Los de la vitrectoma incluyen:   Infeccin   Sangrado   Presin alta dentro del ojo   Nuevo desprendimiento de retina causado por la ciruga   Dao al cristalino   Aceleracin de la formacin de cataratas   Problemas con el movimiento de los ojos, como visin doble   Alteracin del error refractivo (cambio de la visin)   Kelsey Miles de la reparacin   Necesidad de repetir la ciruga   2000-2021 [employer], [place]. All rights reserved. This information is not intended as a substitute for professional medical care. Always follow your healthcare professional's instructions.      Si se somete a [employer] es un tipo de [place] ocular (del ojo) que se realiza para tratar problemas de la retina y del humor vtreo. La retina es la capa de clulas nerviosas que percibe la luz y enva seales al cerebro para que la persona pueda ver. El humor vtreo es una sustancia tipo gel que rellenael ojo.Durante la [place], su oftalmlogo quita el humor vtreo. Se lo reemplaza por otra solucin.   Qu debe avisarle a  su oftalmlogo?  Antes de la [place], mencinele lo siguiente:   Los medicamentos que [place]. Esto incluye los medicamentos recetados y de [address], las vitaminas, los medicamentos a base de hierbas y otros suplementos. Es posible que deba dejar de usar algunos medicamentos antes del procedimiento, como los anticoagulantes y la aspirina.   Si fuma. Quizs deba dejar de fumar antes de su ciruga. Fumar puede retrasar su recuperacin. Hable con su oftalmlogo si necesita ayuda para dejar de fumar.   Si hubo algn cambio reciente en su salud.  Esto incluye una infeccin o fiebre.   Si es sensible o alrgico a algo. Esto incluye medicamentos, ltex, cinta y medicamentos anestsicos.   Si est embarazada o cree que podra estarlo.  Preparativos para la Publishing copy lo siguiente:   Pida a un familiar o amigo que lo acompae del hospital a su casa.   Organcese para que alguien lo ayude en su casa mientras se recupera.   Siga todas las instrucciones que le d Product manager.   Lea atentamente el formulario de consentimiento y haga las preguntas que necesite si algo no est claro.   Siga las instrucciones que le dieron con respecto a la ingesta de bebidas o alimentos antes de la [place].  Pruebas antes de su ciruga  Antes de su ciruga, es posible que el oftalmlogo le examine la retina. Se usan instrumentos especiales para iluminar el ojo con Neomia Dear luz y observar la retina. Es posible que tengan que dilatarle (ensancharle) las pupilas para Education officer, environmental este examen ocular. El proveedor Chemical engineer gotas para dilatar las pupilas. Quizs tambin tengan que hacerle una ecografa del ojo. Esto le permite ver la retina si no puede ver directamente el ojo debido a algn sangrado o por algn otro motivo. La ecografa [place] ondas de sonido para crear imgenes en la pantalla de una computadora.   El da de su ciruga  Hable con su oftalmlogo sobre lo que suceder durante su [place]. Los detalles de la ciruga pueden variar segn el problema tratado. La [place] estar a cargo de un proveedor de atencin mdica especialmente capacitado para Loss adjuster, chartered de ojo (oftalmlogo). En general, puede suceder lo siguiente:    Es posible que le administren anestesia general. Esto lo har dormir profundamente durante la ciruga. O tal vez est despierto durante la ciruga. Se le dar un medicamento para ayudarlo a relajarse. Tambin podran darle gotas anestsicas para los ojos e inyecciones. Es para asegurarse de que est cmodo y relajado.   El cirujano har cortes  pequeos en la membrana blanca del ojo (esclertica) para colocar unos instrumentos.   Quitar el humor vtreo y todo tejido cicatricial u Sales executive.   Se harn otras reparaciones en el ojo segn sea necesario. Por ejemplo, podra usar un lser para corregir un desgarro de la retina.   Se reemplazar el humor vtreo por otro tipo del lquido, por ejemplo, un aceite de silicona o solucin salina. En algunos casos, es posible que el proveedor inyecte una burbuja de gas en el ojo, en vez de lquido. Lo har para ayudar a Insurance account manager.   Es posible que se cierren las incisiones con puntos.   Le colocarn una pomada antibitica en el ojo para ayudar a prevenir infecciones.   Le cubrirn el ojo con una venda.  Despus de su ciruga  Probablemente regresar a su casa ese mismo da. Consiga que alguien lo lleve a casa en automvil. No se le permite  manejar por 24horas despus de [employer]. Pregunte a su oftalmlogo cundo Copy a sus Pensions consultant.   Recuperacin en el hogar  Siga todas las instrucciones de su oftalmlogo acerca de cmo cuidar el ojo.Es posible que el ojo duela un poco despus del procedimiento. Puede tomar medicamentos de venta libre si los aprueba su proveedor. Tal vez necesite usar gotas para los ojos con antibitico. Se administran para ayudar a prevenir infecciones. Quizs necesite usar un parche en el ojo por Optometrist.   Si le colocaron una burbuja de gas en el ojo durante su vitrectoma, tendr que seguir instrucciones especficas como las siguientes:    No viajar en avin despus de la ciruga. Pregunte a su oftalmlogo cundo ser seguro para usted viajar de nuevo en avin.   No usar anestesia con xido nitroso. Pregunte a su oftalmlogo cundo es Primary school teacher a usar este tipo de anestesia.   No viajar a grandes alturas. Hacerlo puede aumentar la presin en los ojos.Pregunte a su oftalmlogo cundo es Primary school teacher a estar a  Herbalist.   Mantener la cabeza y los ojos en la posicin que le indic el oftalmlogo. Pida a su proveedor para que le d instrucciones especficas.   Siempre usar un cinturn de seguridad cuando est viajando.  Visitas de control  Tendr que asistir a visitas de control con Product manager para ver si la ciruga [employer]. Es posible que tenga una cita el da despus del procedimiento. Puede que necesite otra ciruga en el futuro para quitar el lquido de Economist (aceite de silicona) del ojo o corregir cualquier problema nuevo que surja.   Despus de la vitrectoma, es posible que su visin no est completamente normal, especialmente si tuvo un dao permanente en la retina. Pregunte a su oftalmlogo cunta mejora puede esperar y en cunto tiempo. Si se utiliz una burbuja de gas o aceite de silicona, puede llevarle semanas o meses ver mejoras en su visin.   Cundo llamar al oftalmlogo  Llmelo de inmediato si tiene alguno de los siguientes sntomas:   Empeora la visin.   Empeora el dolor, el enrojecimiento, el amoratamiento o la inflamacin alrededor de el ojo.   Fiebre de 100.50F (38C) o ms, o como le indique su proveedor.   Supuracin del ojo.   2000-2021 [employer], [place]. All rights reserved. This information is not intended as a substitute for professional medical care. Always follow your healthcare professional's instructions.

## 2020-06-30 NOTE — H&P (Signed)
History and Physical    39 y/o female here for retinal detachment, diabetic retinopathy, left eye. 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 Plan for vitrectomy, membrane peel, endolaser, gas or oil bubble, left eye

## 2020-06-30 NOTE — Discharge Instr - AVS First Page (Addendum)
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 Start eye drops tomorrow.    Discharge Instructions:         [x]  Sleep on left side        [x]  Avoid face up postioning for 30 days    Be sure to bring the plastic bag given to you when you were leaving the hospital, which contains eye drops and eyeglasses, and some other items.      1. Activity:  For a few days after surgery, especially when you first arrive home, strenuous activity is not advisable.  Do not lift any heavy objects.  Rest is best.    2. Keep the patch on overnight if possible.  If it does come off, do not expect to have normal vision and do not be concerned if the vision is not clear.    3. Resume a light diet and take your regular prescription medications as you normally would.  Use Tylenol for discomfort or a different pain medication if that was prescribed when you left the hospital.  But avoid aspirin.           4. You may shower and shampoo beginning one  day after surgery. Avoid getting water in the eye.     Follow-up with Dr. Quin Hoop, MD,  tomorrow with the [facility] as scheduled.  Signed by: Reuel Derby, MD      GENERAL DISCHARGE INSTRUCTIONS    You may not drive or do anything requiring coordination or balance for 24 hours.  Rest for the rest of the day.  Avoid heavy lifting for 2 weeks after any surgery.    You may not drink alcohol or consume non-prescribed sedatives or tranquilizers for 24 hours unless approved by your physician.    You should not sign important papers or make important decisions in the next 24 hours.    Please have someone responsible with you the first night you are home.    Keep your dressing/wound site clean and dry.  Do not remove the dressing until advised by your physician.  Wash your hands frequently before and after touching your surgical site.    Begin your diet with clear liquids and progress to your normal diet as long as you are not nauseated. It is suggested that you avoid greasy or spicy foods.    It is suggested that unless specified by the pharmacist,  you take all medications with food.  All narcotic type pain medications can cause constipation, keep fluid intake up and increase fiber in your diet.    Things to call your surgeon for:  Persistent Nausea and Vomiting  Chills or a Fever above 101 degrees F  Persistent bleeding, swelling or pus at the operative site  Unable to urinate in 8 hours  Pain that is not relieved by the pain medication.

## 2020-06-30 NOTE — Transfer of Care (Signed)
Anesthesia Transfer of Care Note    Patient: Kelsey Miles    Procedures performed: Procedure(s):  VITRECTOMY, 25 GAUGE, MECHANICAL  VITRECTOMY, MEMBRANE PEELING  VITRECTOMY, LASER/SILICONE OIL    Anesthesia type: MAC    Patient location:Phase II PACU    Last vitals:   Vitals:    06/30/20 1223   BP: (!) 174/110   Pulse: 100   Resp: 18   Temp: 36.4 C (97.5 F)   SpO2: 100%       Post pain: Patient not complaining of pain, continue current therapy      Mental Status:awake and alert     Respiratory Function: tolerating room air    Cardiovascular: stable    Nausea/Vomiting: patient not complaining of nausea or vomiting    Hydration Status: adequate    Post assessment: no apparent anesthetic complications, no reportable events and no evidence of recall    Signed by: Eugene Garnet  06/30/20 3:16 PM

## 2020-06-30 NOTE — Anesthesia Preprocedure Evaluation (Addendum)
Anesthesia Evaluation    AIRWAY    Mallampati: I    TM distance: >3 FB  Neck ROM: full  Mouth Opening:full  Planned to use difficult airway equipment: No CARDIOVASCULAR    cardiovascular exam normal       DENTAL         PULMONARY    pulmonary exam normal     OTHER FINDINGS                  Relevant Problems   ENDO   (+) T2DM (type 2 diabetes mellitus)   (+) Type 2 diabetes mellitus with complication, without long-term current use of insulin               Anesthesia Plan    ASA 2     MAC               (Risk and benefits of anesthesia explained to patient. Questions answered, consent obtained.)      Detailed anesthesia plan: MAC        Post op pain management: per surgeon    informed consent obtained                   Signed by: Donald Siva 06/30/20 11:49 AM

## 2020-07-01 ENCOUNTER — Encounter: Payer: Self-pay | Admitting: Ophthalmology

## 2021-01-08 ENCOUNTER — Inpatient Hospital Stay: Admit: 2021-01-09

## 2021-01-09 LAB — LIPID PANEL
CHOL/HDL Ratio: 6.7 — ABNORMAL HIGH (ref 0.0–5.0)
Chol/HDL Ratio: 6.7 — ABNORMAL HIGH (ref 0.0–5.0)
Cholesterol, Total: 283 MG/DL — ABNORMAL HIGH (ref <200)
Cholesterol, total: 283 MG/DL — ABNORMAL HIGH (ref <200)
HDL Cholesterol: 42 MG/DL
HDL: 42 MG/DL
LDL Calculated: ELEVATED MG/DL (ref 0–100)
LDL, calculated: ELEVATED MG/DL (ref 0–100)
Triglyceride: 650 MG/DL — ABNORMAL HIGH (ref <150)
Triglycerides: 650 MG/DL — ABNORMAL HIGH (ref <150)

## 2021-01-09 LAB — CBC WITH AUTOMATED DIFF
ABS. BASOPHILS: 0.1 10*3/uL (ref 0.0–0.1)
ABS. EOSINOPHILS: 0.3 10*3/uL (ref 0.0–0.4)
ABS. IMM. GRANS.: 0 10*3/uL (ref 0.00–0.04)
ABS. LYMPHOCYTES: 2.3 10*3/uL (ref 0.8–3.5)
ABS. MONOCYTES: 0.5 10*3/uL (ref 0.0–1.0)
ABS. NEUTROPHILS: 4 10*3/uL (ref 1.8–8.0)
ABSOLUTE NRBC: 0 10*3/uL (ref 0.00–0.01)
BASOPHILS: 1 % (ref 0–1)
EOSINOPHILS: 4 % (ref 0–7)
HCT: 32.4 % — ABNORMAL LOW (ref 35.0–47.0)
HGB: 9.5 g/dL — ABNORMAL LOW (ref 11.5–16.0)
IMMATURE GRANULOCYTES: 0 % (ref 0.0–0.5)
LYMPHOCYTES: 32 % (ref 12–49)
MCH: 23.6 PG — ABNORMAL LOW (ref 26.0–34.0)
MCHC: 29.3 g/dL — ABNORMAL LOW (ref 30.0–36.5)
MCV: 80.4 FL (ref 80.0–99.0)
MONOCYTES: 7 % (ref 5–13)
MPV: 10.1 FL (ref 8.9–12.9)
NEUTROPHILS: 56 % (ref 32–75)
NRBC: 0 PER 100 WBC
PLATELET: 417 10*3/uL — ABNORMAL HIGH (ref 150–400)
RBC: 4.03 M/uL (ref 3.80–5.20)
RDW: 15.8 % — ABNORMAL HIGH (ref 11.5–14.5)
WBC: 7.2 10*3/uL (ref 3.6–11.0)

## 2021-01-09 LAB — LDL, DIRECT: LDL,Direct: 103 mg/dl — ABNORMAL HIGH (ref 0–100)

## 2021-01-09 LAB — METABOLIC PANEL, COMPREHENSIVE
A-G Ratio: 0.8 — ABNORMAL LOW (ref 1.1–2.2)
ALT (SGPT): 29 U/L (ref 12–78)
AST (SGOT): 18 U/L (ref 15–37)
Albumin: 3.1 g/dL — ABNORMAL LOW (ref 3.5–5.0)
Alk. phosphatase: 100 U/L (ref 45–117)
Anion gap: 6 mmol/L (ref 5–15)
BUN/Creatinine ratio: 23 — ABNORMAL HIGH (ref 12–20)
BUN: 14 MG/DL (ref 6–20)
Bilirubin, total: 0.3 MG/DL (ref 0.2–1.0)
CO2: 24 mmol/L (ref 21–32)
Calcium: 8.3 MG/DL — ABNORMAL LOW (ref 8.5–10.1)
Chloride: 102 mmol/L (ref 97–108)
Creatinine: 0.6 MG/DL (ref 0.55–1.02)
GFR est AA: >60 mL/min/{1.73_m2} (ref >60)
GFR est non-AA: >60 mL/min/{1.73_m2} (ref >60)
Globulin: 3.7 g/dL (ref 2.0–4.0)
Glucose: 282 mg/dL — ABNORMAL HIGH (ref 65–100)
Potassium: 4.1 mmol/L (ref 3.5–5.1)
Protein, total: 6.8 g/dL (ref 6.4–8.2)
Sodium: 132 mmol/L — ABNORMAL LOW (ref 136–145)

## 2021-01-09 LAB — TSH 3RD GENERATION
TSH: 0.77 u[IU]/mL (ref 0.36–3.74)
TSH: 0.77 u[IU]/mL (ref 0.36–3.74)

## 2021-01-09 LAB — HEMOGLOBIN A1C WITH EAG
Est. average glucose: 286 mg/dL
Hemoglobin A1c: 11.6 % — ABNORMAL HIGH (ref 4.0–5.6)

## 2021-01-09 LAB — CBC WITH AUTO DIFFERENTIAL
Basophils %: 1 % (ref 0–1)
Basophils Absolute: 0.1 10*3/uL (ref 0.0–0.1)
Eosinophils %: 4 % (ref 0–7)
Eosinophils Absolute: 0.3 10*3/uL (ref 0.0–0.4)
Granulocyte Absolute Count: 0 10*3/uL (ref 0.00–0.04)
Hematocrit: 32.4 % — ABNORMAL LOW (ref 35.0–47.0)
Hemoglobin: 9.5 g/dL — ABNORMAL LOW (ref 11.5–16.0)
Immature Granulocytes: 0 % (ref 0.0–0.5)
Lymphocytes %: 32 % (ref 12–49)
Lymphocytes Absolute: 2.3 10*3/uL (ref 0.8–3.5)
MCH: 23.6 PG — ABNORMAL LOW (ref 26.0–34.0)
MCHC: 29.3 g/dL — ABNORMAL LOW (ref 30.0–36.5)
MCV: 80.4 FL (ref 80.0–99.0)
MPV: 10.1 FL (ref 8.9–12.9)
Monocytes %: 7 % (ref 5–13)
Monocytes Absolute: 0.5 10*3/uL (ref 0.0–1.0)
NRBC Absolute: 0 10*3/uL (ref 0.00–0.01)
Neutrophils %: 56 % (ref 32–75)
Neutrophils Absolute: 4 10*3/uL (ref 1.8–8.0)
Nucleated RBCs: 0 PER 100 WBC
Platelets: 417 10*3/uL — ABNORMAL HIGH (ref 150–400)
RBC: 4.03 M/uL (ref 3.80–5.20)
RDW: 15.8 % — ABNORMAL HIGH (ref 11.5–14.5)
WBC: 7.2 10*3/uL (ref 3.6–11.0)

## 2021-01-09 LAB — HEMOGLOBIN A1C W/EAG
Hemoglobin A1C: 11.6 % — ABNORMAL HIGH (ref 4.0–5.6)
eAG: 286 mg/dL

## 2021-01-09 LAB — COMPREHENSIVE METABOLIC PANEL
ALT: 29 U/L (ref 12–78)
AST: 18 U/L (ref 15–37)
Albumin/Globulin Ratio: 0.8 — ABNORMAL LOW (ref 1.1–2.2)
Albumin: 3.1 g/dL — ABNORMAL LOW (ref 3.5–5.0)
Alkaline Phosphatase: 100 U/L (ref 45–117)
Anion Gap: 6 mmol/L (ref 5–15)
BUN: 14 MG/DL (ref 6–20)
Bun/Cre Ratio: 23 — ABNORMAL HIGH (ref 12–20)
CO2: 24 mmol/L (ref 21–32)
Calcium: 8.3 MG/DL — ABNORMAL LOW (ref 8.5–10.1)
Chloride: 102 mmol/L (ref 97–108)
Creatinine: 0.6 MG/DL (ref 0.55–1.02)
EGFR IF NonAfrican American: >60 mL/min/{1.73_m2} (ref >60)
GFR African American: >60 mL/min/{1.73_m2} (ref >60)
Globulin: 3.7 g/dL (ref 2.0–4.0)
Glucose: 282 mg/dL — ABNORMAL HIGH (ref 65–100)
Potassium: 4.1 mmol/L (ref 3.5–5.1)
Sodium: 132 mmol/L — ABNORMAL LOW (ref 136–145)
Total Bilirubin: 0.3 MG/DL (ref 0.2–1.0)
Total Protein: 6.8 g/dL (ref 6.4–8.2)

## 2021-01-09 LAB — LDL CHOLESTEROL, DIRECT: LDL Direct: 103 mg/dl — ABNORMAL HIGH (ref 0–100)

## 2021-03-26 ENCOUNTER — Inpatient Hospital Stay: Admit: 2021-03-27

## 2021-03-27 LAB — URINALYSIS W/ RFLX MICROSCOPIC
Bilirubin, Urine: NEGATIVE
Bilirubin: NEGATIVE
Glucose, Ur: >1000 mg/dL — AB
Glucose: >1000 mg/dL — AB
Ketone: NEGATIVE mg/dL
Ketones, Urine: NEGATIVE mg/dL
Nitrite, Urine: NEGATIVE
Nitrites: NEGATIVE
Protein, UA: 100 mg/dL — AB
Protein: 100 mg/dL — AB
Specific Gravity, UA: 1.024 (ref 1.003–1.030)
Specific gravity: 1.024 (ref 1.003–1.030)
Urobilinogen, UA, POCT: 0.2 EU/dL (ref 0.2–1.0)
Urobilinogen: 0.2 EU/dL (ref 0.2–1.0)
pH (UA): 5 (ref 5.0–8.0)
pH, UA: 5 (ref 5.0–8.0)

## 2021-03-29 LAB — CULTURE, URINE

## 2021-03-30 LAB — CHLAMYDIA/NEISSERIA/TRICHOMONAS AMP
Chlamydia amplification: NEGATIVE
Chlamydia trachomatis, NAA: NEGATIVE
N. gonorrhoeae amplification: NEGATIVE
Neisseria Gonorrhoeae, NAA: NEGATIVE
TRICHOMONAS AMPLIFICATION, TVAMP: NEGATIVE
Trichomonas amplification: NEGATIVE

## 2021-03-30 LAB — CULTURE, URINE
Colonies Counted: >100000
Colony Count: >100000

## 2021-06-22 ENCOUNTER — Ambulatory Visit: Admit: 2021-06-22 | Discharge: 2021-06-22 | Payer: Charity | Attending: Specialist

## 2021-06-22 ENCOUNTER — Ambulatory Visit: Attending: Specialist

## 2021-06-22 DIAGNOSIS — R000 Tachycardia, unspecified: Secondary | ICD-10-CM

## 2021-06-22 MED ORDER — FENOFIBRATE NANOCRYSTALLIZED 160 MG TABLET
160 mg | ORAL_TABLET | Freq: Every day | ORAL | 5 refills | Status: AC
Start: 2021-06-22 — End: ?

## 2021-06-22 MED ORDER — METOPROLOL SUCCINATE SR 25 MG 24 HR TAB
25 mg | ORAL_TABLET | Freq: Every day | ORAL | 5 refills | 90.00 days | Status: DC
Start: 2021-06-22 — End: 2021-09-07

## 2021-06-22 NOTE — Progress Notes (Signed)
Chief Complaint   Patient presents with    New Patient    Referral / Consult     By Tonia Ghent for Tachy       Vitals:    06/22/21 1559   BP: 120/74   Pulse: 90   Weight: 135 lb 9.6 oz (61.5 kg)   SpO2: 99%         Chest pain: Starts in Center of chest and goes down to stomach. Unable to rate on pain scale.     SOB: yes, when being active.     Palpitations: no    Dizziness: yes, when being active.     Swelling: yes, Feet    Refills:       Have you been to the ER, urgent care clinic since your last visit? Hospitalized since your last visit? no    Have you sen or consulted other health care providers outside of the [facility] system since your last visit? (Include any pap smears or colon screening.)

## 2021-06-22 NOTE — Progress Notes (Signed)
Dr Doloresco pt needs 14 day loop for Tach

## 2021-06-22 NOTE — Progress Notes (Signed)
Progress Notes by Vennie Homans, MD at 06/22/21 1540                Author: Vennie Homans, MD  Service: --  Author Type: Physician       Filed: 06/22/21 1709  Encounter Date: 06/22/2021  Status: Signed          Editor: Vennie Homans, MD (Physician)                                       CARDIOLOGY OFFICE NOTE      Melinda How. Jais Demir, MD, [address], [place] [address]   Phone [phone]; Fax [phone]   Mobile [phone]   Voice Mail [phone]      Primary care: None          ATTENTION:    This medical record was transcribed using an electronic medical records/speech recognition system.  Although proofread, it may and can contain electronic, spelling and other errors.  Corrections may be executed at a later time.  Please feel free to  contact us for any clarifications as needed.                   Melinda [name] is a 40 y.o. female with  referred for tachycardia            Cardiac risk factors: dyslipidemia, FMHx ,diabetes mellitus x 15 yrs, sedentary life style, hypertension   I have personally obtained the history from the patient.        HISTORY OF PRESENTING ILLNESS      Melinda [name]  40 y.o. is seeing me for tachycardia.   We spoke through an interpreter   She goes to diabetes clinic and they referred her here to have her heart checked per patient . She will notice her heart rate is too slow while sleeping. No chest pain but some SOB.  She does not exercise. She has a food truck. She will feel headaches  at times. Her legs also hurt.          ACTIVE PROBLEM LIST        There are no problems to display for this patient.                PAST MEDICAL HISTORY          Past Medical History:        Diagnosis  Date         ?  High blood pressure       ?  High cholesterol       ?  Type 2 diabetes mellitus ([place])            Controled                  PAST SURGICAL HISTORY          Past Surgical History:         Procedure  Laterality  Date          ?  HX OTHER  SURGICAL  Left            [facility] attachment                 ALLERGIES        Not on File  FAMILY HISTORY        History reviewed. No pertinent family history. negative for cardiac disease            SOCIAL HISTORY          Social History          Socioeconomic History         ?  Marital status:  UNKNOWN       Tobacco Use         ?  Smoking status:  Never              Passive exposure:  Never         ?  Smokeless tobacco:  Never       Substance and Sexual Activity         ?  Alcohol use:  Never         ?  Drug use:  Never                MEDICATIONS          Current Outpatient Medications        Medication  Sig         ?  hydroCHLOROthiazide (HYDRODIURIL) 25 mg tablet  Take 25 mg by mouth daily.     ?  NovoLIN 70/30 U-100 Insulin 100 unit/mL (70-30) injection  1 Units by SubCUTAneous route daily. 17/30     ?  metFORMIN ER 1,000 mg tr24  Take 1,000 mg by mouth two (2) times a day.         ?  SITagliptin (Januvia) 25 mg tablet  Take 25 mg by mouth daily. Dose needs to be Verified          No current facility-administered medications for this visit.           I have reviewed the nurses notes, vitals, problem list, allergy list, medical history, family, social history and medications.            REVIEW OF SYMPTOMS      As per HPI   General: Pt denies excessive weight gain or loss. Pt is able to conduct ADL's   HEENT: Denies blurred vision, headaches, hearing loss, epistaxis and difficulty swallowing.   Respiratory: Denies cough, congestion, shortness of breath, DOE, wheezing or stridor.   Cardiovascular: Denies precordial pain, palpitations, edema or PND   Gastrointestinal: Denies poor appetite, indigestion, abdominal pain or blood in stool   Genitourinary: Denies hematuria, dysuria, increased urinary frequency   Musculoskeletal: Denies joint pain or swelling from muscles or joints   Neurologic: Denies tremor, paresthesias, headache, or sensory motor disturbance   Psychiatric: Denies confusion, insomnia,  depression   Integumentray: Denies rash, itching or ulcers.   Hematologic: Denies easy bruising, bleeding         PHYSICAL EXAMINATION           Vitals:          06/22/21 1559        BP:  120/74     Pulse:  90     SpO2:  99%        Weight:  135 lb 9.6 oz (61.5 kg)        General: Well developed, in no acute distress.  Spoke through an interpreter   HEENT: No jaundice, oral mucosa moist, no oral ulcers   Neck: Supple, no stiffness, no lymphadenopathy, supple   Heart: Fast rate regular rhythm   Respiratory: Clear bilaterally x 4,  no wheezing or rales   Extremities:  No edema, normal cap refill, no cyanosis.   Musculoskeletal: No clubbing, no deformities   Neuro: A&Ox3, speech clear, gait stable, cooperative, no focal neurologic deficits   Skin: Skin color is normal. No rashes or lesions. Non diaphoretic, moist.   Vascular: 2+ pulses symmetric in all extremities   Abdomen:   Soft, non-tender, bowel sounds are active.          EKG: Date: (06/22/2021)-sinus tachycardia left anterior fascicular block         DIAGNOSTIC DATA        1. Lipids   01/08/21- TC 283, HDL 42, TG 650               LABORATORY DATA             Lab Results         Component  Value  Date/Time            WBC  7.2  01/08/2021 11:35 AM       HGB  9.5 (L)  01/08/2021 11:35 AM       HCT  32.4 (L)  01/08/2021 11:35 AM       PLATELET  417 (H)  01/08/2021 11:35 AM            MCV  80.4  01/08/2021 11:35 AM           Lab Results         Component  Value  Date/Time            Sodium  132 (L)  01/08/2021 11:35 AM       Potassium  4.1  01/08/2021 11:35 AM       Chloride  102  01/08/2021 11:35 AM       CO2  24  01/08/2021 11:35 AM       Anion gap  6  01/08/2021 11:35 AM       Glucose  282 (H)  01/08/2021 11:35 AM       BUN  14  01/08/2021 11:35 AM       Creatinine  0.60  01/08/2021 11:35 AM       BUN/Creatinine ratio  23 (H)  01/08/2021 11:35 AM       GFR est AA  >60  01/08/2021 11:35 AM       GFR est non-AA  >60  01/08/2021 11:35 AM       Calcium  8.3 (L)   01/08/2021 11:35 AM       Bilirubin, total  0.3  01/08/2021 11:35 AM       Alk. phosphatase  100  01/08/2021 11:35 AM       Protein, total  6.8  01/08/2021 11:35 AM       Albumin  3.1 (L)  01/08/2021 11:35 AM       Globulin  3.7  01/08/2021 11:35 AM       A-G Ratio  0.8 (L)  01/08/2021 11:35 AM            ALT (SGPT)  29  01/08/2021 11:35 AM                        ICD-10-CM  ICD-9-CM          1.  Annual physical exam   Z00.00  V70.0              ASSESSMENT/RECOMMENDATIONS:.  1.  Tachycardia   -will place an event monitor x 2 weeks   -It appears that her glucose levels are elevated and I thought possibly may be causing some dehydration but she says she is not urinating a  lot.  If this was the case her tachycardia could be related to her poorly controlled diabetes   2. Hypertension   -I rechecked her blood pressure was 140/90   -Continue on her hydrochlorothiazide for the time being   -I have placed on metoprolol XL 25 mg take at night   -We will recheck her blood pressure during stress testing   3. Dyslipidemia   -LDL goal is less than 70   -LDL could not be calculated secondary to elevated TG   -will start fenofibrate   -recheck FLP in 2 mo.    4. DM     -HgbA1c 11.3         Orders Placed This Encounter        ?  AMB POC EKG ROUTINE W/ 12 LEADS, INTER & REP              Order Specific Question:    Reason for Exam:         Answer:    annual        ?  hydroCHLOROthiazide (HYDRODIURIL) 25 mg tablet             Sig: Take 25 mg by mouth daily.        ?  NovoLIN 70/30 U-100 Insulin 100 unit/mL (70-30) injection             Sig: 1 Units by SubCUTAneous route daily. 17/30        ?  metFORMIN ER 1,000 mg tr24             Sig: Take 1,000 mg by mouth two (2) times a day.        ?  SITagliptin (Januvia) 25 mg tablet             Sig: Take 25 mg by mouth daily. Dose needs to be Verified           We discussed the expected course, resolution and complications of the diagnosis(es) in detail.  Medication risks, benefits,  costs, interactions, and alternatives were discussed as indicated.  I advised him to contact the office if his condition worsens,  changes or fails to improve as anticipated. He expressed understanding with the diagnosis(es) and plan                      I have discussed the diagnosis with  [facility] and the intended plan as seen  in the above orders.  Questions were answered concerning future plans.  I have discussed medication side effects and warnings with the patient as well.      Thank you,  None for involving me in the care of  [facility]. Please do not hesitate to contact me for further questions/concerns.             Tim Wilhide A.Farley Crooker,  MD, [facility]       [address] [place], [place]      [place], [place]       [phone] / [phone] Fax

## 2021-06-22 NOTE — Progress Notes (Signed)
Charity letter printed for Dr Lorella Nimrod to sign & send to [employer] to see if pt can be approved for a charity loop.

## 2021-06-22 NOTE — Addendum Note (Signed)
Addendum  Note by Lindell Spar C at 06/22/21 1540                Author: Lindell Spar C  Service: --  Author Type: Technician       Filed: 07/27/21 1135  Encounter Date: 06/22/2021  Status: Signed          Editor: Jacklynn Barnacle (Technician)          Addended by: Jacklynn Barnacle on: 07/27/2021 11:35 AM    Modules accepted: Orders

## 2021-07-07 ENCOUNTER — Institutional Professional Consult (permissible substitution)

## 2021-07-07 NOTE — Progress Notes (Signed)
CEM loop mailed per Dr Lorella Nimrod dx: tachy.  Charity per [employer]  Non chargeable visit.

## 2021-07-08 ENCOUNTER — Inpatient Hospital Stay
Admit: 2021-07-08 | Discharge: 2021-07-10 | Disposition: A | Discharge status: Home / Self Care | Payer: Charity | Attending: Internal Medicine | Admitting: Internal Medicine

## 2021-07-08 ENCOUNTER — Emergency Department: Admit: 2021-07-08 | Payer: Charity

## 2021-07-08 ENCOUNTER — Emergency Department: Admit: 2021-07-09 | Payer: Charity

## 2021-07-08 ENCOUNTER — Inpatient Hospital Stay

## 2021-07-08 DIAGNOSIS — R109 Unspecified abdominal pain: Secondary | ICD-10-CM

## 2021-07-08 DIAGNOSIS — N3000 Acute cystitis without hematuria: Secondary | ICD-10-CM

## 2021-07-08 LAB — URINALYSIS W/MICROSCOPIC
Bilirubin: NEGATIVE
Glucose: >1000 mg/dL — AB
Ketone: NEGATIVE mg/dL
Nitrites: NEGATIVE
Protein: 100 mg/dL — AB
Specific gravity: 1.017 (ref 1.003–1.030)
Urobilinogen: 0.2 EU/dL (ref 0.2–1.0)
pH (UA): 6 (ref 5.0–8.0)

## 2021-07-08 LAB — SAMPLES BEING HELD

## 2021-07-08 LAB — CBC WITH AUTOMATED DIFF
ABS. BASOPHILS: 0.1 10*3/uL (ref 0.0–0.1)
ABS. EOSINOPHILS: 0.3 10*3/uL (ref 0.0–0.4)
ABS. IMM. GRANS.: 0 10*3/uL (ref 0.00–0.04)
ABS. LYMPHOCYTES: 2.7 10*3/uL (ref 0.8–3.5)
ABS. MONOCYTES: 0.6 10*3/uL (ref 0.0–1.0)
ABS. NEUTROPHILS: 5.7 10*3/uL (ref 1.8–8.0)
ABSOLUTE NRBC: 0 10*3/uL (ref 0.00–0.01)
BASOPHILS: 1 % (ref 0–1)
EOSINOPHILS: 3 % (ref 0–7)
HCT: 36.7 % (ref 35.0–47.0)
HGB: 12.5 g/dL (ref 11.5–16.0)
IMMATURE GRANULOCYTES: 0 % (ref 0.0–0.5)
LYMPHOCYTES: 29 % (ref 12–49)
MCH: 29 PG (ref 26.0–34.0)
MCHC: 34.1 g/dL (ref 30.0–36.5)
MCV: 85.2 FL (ref 80.0–99.0)
MONOCYTES: 6 % (ref 5–13)
MPV: 9.6 FL (ref 8.9–12.9)
NEUTROPHILS: 61 % (ref 32–75)
NRBC: 0 PER 100 WBC
PLATELET: 428 10*3/uL — ABNORMAL HIGH (ref 150–400)
RBC: 4.31 M/uL (ref 3.80–5.20)
RDW: 12.4 % (ref 11.5–14.5)
WBC: 9.3 10*3/uL (ref 3.6–11.0)

## 2021-07-08 LAB — METABOLIC PANEL, COMPREHENSIVE
A-G Ratio: 0.7 — ABNORMAL LOW (ref 1.1–2.2)
ALT (SGPT): 20 U/L (ref 12–78)
AST (SGOT): 12 U/L — ABNORMAL LOW (ref 15–37)
Albumin: 3.2 g/dL — ABNORMAL LOW (ref 3.5–5.0)
Alk. phosphatase: 105 U/L (ref 45–117)
Anion gap: 8 mmol/L (ref 5–15)
BUN/Creatinine ratio: 23 — ABNORMAL HIGH (ref 12–20)
BUN: 26 MG/DL — ABNORMAL HIGH (ref 6–20)
Bilirubin, total: 0.2 MG/DL (ref 0.2–1.0)
CO2: 29 mmol/L (ref 21–32)
Calcium: 9.6 MG/DL (ref 8.5–10.1)
Chloride: 96 mmol/L — ABNORMAL LOW (ref 97–108)
Creatinine: 1.15 MG/DL — ABNORMAL HIGH (ref 0.55–1.02)
GFR est AA: >60 mL/min/{1.73_m2} (ref >60)
GFR est non-AA: 53 mL/min/{1.73_m2} — ABNORMAL LOW (ref >60)
Globulin: 4.8 g/dL — ABNORMAL HIGH (ref 2.0–4.0)
Glucose: 393 mg/dL — ABNORMAL HIGH (ref 65–100)
Potassium: 3.6 mmol/L (ref 3.5–5.1)
Protein, total: 8 g/dL (ref 6.4–8.2)
Sodium: 133 mmol/L — ABNORMAL LOW (ref 136–145)

## 2021-07-08 LAB — LIPASE
Lipase: 178 U/L (ref 73–393)
Lipase: 178 U/L (ref 73–393)

## 2021-07-08 LAB — TROPONIN-HIGH SENSITIVITY: Troponin-High Sensitivity: 4 ng/L (ref 0–51)

## 2021-07-08 LAB — HCG URINE, QL. - POC
HCG, Pregnancy, Urine, POC: NEGATIVE
Pregnancy test,urine (POC): NEGATIVE

## 2021-07-08 LAB — URINE CULTURE HOLD SAMPLE

## 2021-07-08 LAB — URINALYSIS WITH MICROSCOPIC
Bilirubin, Urine: NEGATIVE
Glucose, Ur: >1000 mg/dL — AB
Ketones, Urine: NEGATIVE mg/dL
Nitrite, Urine: NEGATIVE
Protein, UA: 100 mg/dL — AB
Specific Gravity, UA: 1.017 (ref 1.003–1.030)
Urobilinogen, UA, POCT: 0.2 EU/dL (ref 0.2–1.0)
pH, UA: 6 (ref 5.0–8.0)

## 2021-07-08 LAB — COMPREHENSIVE METABOLIC PANEL
ALT: 20 U/L (ref 12–78)
AST: 12 U/L — ABNORMAL LOW (ref 15–37)
Albumin/Globulin Ratio: 0.7 — ABNORMAL LOW (ref 1.1–2.2)
Albumin: 3.2 g/dL — ABNORMAL LOW (ref 3.5–5.0)
Alkaline Phosphatase: 105 U/L (ref 45–117)
Anion Gap: 8 mmol/L (ref 5–15)
BUN: 26 MG/DL — ABNORMAL HIGH (ref 6–20)
Bun/Cre Ratio: 23 — ABNORMAL HIGH (ref 12–20)
CO2: 29 mmol/L (ref 21–32)
Calcium: 9.6 MG/DL (ref 8.5–10.1)
Chloride: 96 mmol/L — ABNORMAL LOW (ref 97–108)
Creatinine: 1.15 MG/DL — ABNORMAL HIGH (ref 0.55–1.02)
EGFR IF NonAfrican American: 53 mL/min/{1.73_m2} — ABNORMAL LOW (ref >60)
GFR African American: >60 mL/min/{1.73_m2} (ref >60)
Globulin: 4.8 g/dL — ABNORMAL HIGH (ref 2.0–4.0)
Glucose: 393 mg/dL — ABNORMAL HIGH (ref 65–100)
Potassium: 3.6 mmol/L (ref 3.5–5.1)
Sodium: 133 mmol/L — ABNORMAL LOW (ref 136–145)
Total Bilirubin: 0.2 MG/DL (ref 0.2–1.0)
Total Protein: 8 g/dL (ref 6.4–8.2)

## 2021-07-08 LAB — CBC WITH AUTO DIFFERENTIAL
Basophils %: 1 % (ref 0–1)
Basophils Absolute: 0.1 10*3/uL (ref 0.0–0.1)
Eosinophils %: 3 % (ref 0–7)
Eosinophils Absolute: 0.3 10*3/uL (ref 0.0–0.4)
Granulocyte Absolute Count: 0 10*3/uL (ref 0.00–0.04)
Hematocrit: 36.7 % (ref 35.0–47.0)
Hemoglobin: 12.5 g/dL (ref 11.5–16.0)
Immature Granulocytes: 0 % (ref 0.0–0.5)
Lymphocytes %: 29 % (ref 12–49)
Lymphocytes Absolute: 2.7 10*3/uL (ref 0.8–3.5)
MCH: 29 PG (ref 26.0–34.0)
MCHC: 34.1 g/dL (ref 30.0–36.5)
MCV: 85.2 FL (ref 80.0–99.0)
MPV: 9.6 FL (ref 8.9–12.9)
Monocytes %: 6 % (ref 5–13)
Monocytes Absolute: 0.6 10*3/uL (ref 0.0–1.0)
NRBC Absolute: 0 10*3/uL (ref 0.00–0.01)
Neutrophils %: 61 % (ref 32–75)
Neutrophils Absolute: 5.7 10*3/uL (ref 1.8–8.0)
Nucleated RBCs: 0 PER 100 WBC
Platelets: 428 10*3/uL — ABNORMAL HIGH (ref 150–400)
RBC: 4.31 M/uL (ref 3.80–5.20)
RDW: 12.4 % (ref 11.5–14.5)
WBC: 9.3 10*3/uL (ref 3.6–11.0)

## 2021-07-08 LAB — TROPONIN, HIGH SENSITIVITY: Troponin, High Sensitivity: 4 ng/L (ref 0–51)

## 2021-07-08 MED ORDER — SODIUM CHLORIDE 0.9% BOLUS IV
0.9 % | Freq: Once | INTRAVENOUS | Status: AC
Start: 2021-07-08 — End: 2021-07-08
  Administered 2021-07-08: via INTRAVENOUS

## 2021-07-08 MED ORDER — SODIUM CHLORIDE 0.9% BOLUS IV
0.9 % | Freq: Once | INTRAVENOUS | Status: AC
Start: 2021-07-08 — End: 2021-07-08
  Administered 2021-07-08: 23:00:00 via INTRAVENOUS

## 2021-07-08 MED ORDER — HYDROCHLOROTHIAZIDE 25 MG TAB
25 mg | ORAL | Status: AC
Start: 2021-07-08 — End: 2021-07-08
  Administered 2021-07-08: via ORAL

## 2021-07-08 MED ORDER — METOPROLOL SUCCINATE SR 25 MG 24 HR TAB
25 mg | Freq: Once | ORAL | Status: AC
Start: 2021-07-08 — End: 2021-07-08
  Administered 2021-07-08: via ORAL

## 2021-07-08 MED ORDER — MORPHINE 4 MG/ML INTRAVENOUS SOLUTION
4 mg/mL | INTRAVENOUS | Status: AC
Start: 2021-07-08 — End: 2021-07-08
  Administered 2021-07-08: 23:00:00 via INTRAVENOUS

## 2021-07-08 MED ORDER — ALUM-MAG HYDROXIDE-SIMETH 200 MG-200 MG-20 MG/5 ML ORAL SUSP
200-200-205 mg/5 mL | Freq: Once | ORAL | Status: AC
Start: 2021-07-08 — End: 2021-07-08
  Administered 2021-07-09: via ORAL

## 2021-07-08 MED ORDER — ONDANSETRON (PF) 4 MG/2 ML INJECTION
4 mg/2 mL | INTRAMUSCULAR | Status: AC
Start: 2021-07-08 — End: 2021-07-08
  Administered 2021-07-08: 23:00:00 via INTRAVENOUS

## 2021-07-08 MED FILL — ONDANSETRON (PF) 4 MG/2 ML INJECTION: 4 mg/2 mL | INTRAMUSCULAR | Qty: 2

## 2021-07-08 MED FILL — SODIUM CHLORIDE 0.9 % IV: INTRAVENOUS | Qty: 1000

## 2021-07-08 MED FILL — MORPHINE 4 MG/ML SYRINGE: 4 mg/mL | INTRAMUSCULAR | Qty: 1

## 2021-07-08 MED FILL — MAG-AL PLUS 200 MG-200 MG-20 MG/5 ML ORAL SUSPENSION: 200-200-20 mg/5 mL | ORAL | Qty: 30

## 2021-07-08 MED FILL — METOPROLOL SUCCINATE SR 25 MG 24 HR TAB: 25 mg | ORAL | Qty: 1

## 2021-07-08 MED FILL — HYDROCHLOROTHIAZIDE 25 MG TAB: 25 mg | ORAL | Qty: 1

## 2021-07-08 NOTE — ED Notes (Signed)
 TRANSFER - OUT REPORT:    Verbal report given to Sarah RN (name) on [place]  being transferred to 5th floor (unit) for routine progression of care       Report consisted of patient's Situation, Background, Assessment and   Recommendations(SBAR).     Information from the following report(s) SBAR, ED Summary, Procedure Summary, Intake/Output, MAR, Recent Results, Cardiac Rhythm Sinus Tach, and Quality Measures was reviewed with the receiving nurse.    Lines:   Peripheral IV 07/08/21 Right Forearm (Active)   Site Assessment Clean, dry, & intact 07/08/21 1747   Phlebitis Assessment 0 07/08/21 1747   Infiltration Assessment 0 07/08/21 1747   Dressing Status Clean, dry, & intact 07/08/21 1747   Dressing Type Transparent;Tape 07/08/21 1747   Hub Color/Line Status Blue;Patent;Flushed 07/08/21 1747   Action Taken Blood drawn 07/08/21 1747        Opportunity for questions and clarification was provided.      Patient transported with:   [employer]

## 2021-07-08 NOTE — ED Provider Notes (Signed)
ED Provider Notes by Coralee Rud, PA at 07/08/21 1800                Author: Coralee Rud, PA  Service: Emergency Medicine  Author Type: Physician Assistant       Filed: 07/08/21 2133  Date of Service: 07/08/21 1800  Status: Attested           Editor: Coralee Rud, PA (Physician Assistant)  Cosigner: [place], Orie Rout, MD at 07/08/21 2316          Attestation signed by Etheleen Nicks, MD at 07/08/21 2316          I was personally available for consultation in the emergency department.  I have reviewed the chart and agree with the documentation recorded by the [facility], including  the assessment, treatment plan, and disposition.   Etheleen Nicks, MD                                 Patient is a 40 year old female with history of HTN, HLD, type 2 diabetes who presents to ED c/o epigastric pain which  started 3 days prior. Reports pain has been worsening, occasionally radiates to chest, not exacerbated or alleviated by any factors. Patient also developed nausea, vomiting, chills and headache. Patient reports symptoms have been gradually worsening,  and vomiting started today. She denies any fever, difficulty breathing, shortness of breath, cough, diarrhea, constipation, dysuria, urinary frequency, flank pain. Patient reports she is currently wearing a holter monitor due to tachycardia and is followed  by Dr. Peggyann Juba. She reports she is scheduled to have additional cardiac testing performed in the near future.       Cardiologist: Dr. Peggyann Juba       Spanish translator [place] [phone] was used throughout encounter.    Creatinine is elevated        Past Medical History:        Diagnosis  Date         ?  High blood pressure       ?  High cholesterol       ?  Type 2 diabetes mellitus ([facility])            Controled             Past Surgical History:         Procedure  Laterality  Date          ?  HX OTHER SURGICAL  Left            [facility] attachment             No family history on file.        Social  History          Socioeconomic History         ?  Marital status:  MARRIED              Spouse name:  Not on file         ?  Number of children:  Not on file     ?  Years of education:  Not on file     ?  Highest education level:  Not on file       Occupational History        ?  Not on file       Tobacco Use         ?  Smoking status:  Never              Passive exposure:  Never         ?  Smokeless tobacco:  Never       Substance and Sexual Activity         ?  Alcohol use:  Never     ?  Drug use:  Never     ?  Sexual activity:  Not on file        Other Topics  Concern        ?  Not on file       Social History Narrative        ?  Not on file          Social Determinants of Health          Financial Resource Strain: Not on file     Food Insecurity: Not on file     Transportation Needs: Not on file     Physical Activity: Not on file     Stress: Not on file     Social Connections: Not on file     Intimate Partner Violence: Not on file       Housing Stability: Not on file              ALLERGIES: Patient has no known allergies.      Review of Systems    Constitutional:  Positive for chills. Negative for activity change, appetite change and fever.    HENT:  Negative for congestion and sore throat.     Eyes:  Negative for pain and visual disturbance.    Respiratory:  Negative for cough and shortness of breath.     Cardiovascular:  Positive for chest pain. Negative for palpitations and leg swelling.    Gastrointestinal:  Positive for abdominal pain, nausea  and vomiting. Negative for abdominal distention, constipation and diarrhea.    Genitourinary:  Negative for decreased urine volume, dysuria, flank pain, frequency and urgency.    Musculoskeletal:  Negative for back pain and neck pain.    Skin:  Negative for rash and wound.    Allergic/Immunologic: Negative for immunocompromised state.    Neurological:  Positive for headaches. Negative for dizziness, syncope, weakness, light-headedness and numbness.     Psychiatric/Behavioral:  Negative for confusion.     All other systems reviewed and are negative.        Vitals:          07/08/21 1748        BP:  (!) 175/95     Pulse:  (!) 118     Resp:  20     Temp:  98.2 ??F (36.8 ??C)     SpO2:  99%     Weight:  56.7 kg (125 lb)        Height:  4' 11"  (1.499 m)                Physical Exam   Vitals and nursing note reviewed.    Constitutional:        General: She is not in acute distress.      Appearance: Normal appearance. She is well-developed. She is not toxic-appearing.    HENT:       Head: Normocephalic and atraumatic.       Nose: Nose normal.       Mouth/Throat:       Mouth: Mucous membranes are moist.  Eyes:       General: Lids are normal.       Extraocular Movements: Extraocular movements intact.       Conjunctiva/sclera: Conjunctivae normal.     Cardiovascular:       Rate and Rhythm: Regular rhythm. Tachycardia present.       Pulses: Normal pulses.       Heart sounds: Normal heart sounds, S1 normal and S2 normal.    Pulmonary:       Effort: Pulmonary effort is normal. No accessory muscle usage.       Breath sounds: Normal breath sounds.       Comments: Holter monitor in place     Abdominal:       General: Bowel sounds are normal.       Palpations: Abdomen is soft.       Tenderness: There is abdominal tenderness in the epigastric area . There is no right CVA tenderness, left CVA tenderness, guarding or rebound.     Musculoskeletal:          General: Normal range of motion.       Cervical back: Normal range of motion and neck supple.    Skin:      General: Skin is warm and dry.       Capillary Refill: Capillary refill takes less than 2 seconds.    Neurological:       General: No focal deficit present.       Mental Status: She is alert and oriented to person, place, and time. Mental status is at baseline.    Psychiatric:          Attention and Perception: Attention normal.          Mood and Affect: Mood and affect normal.          Speech: Speech normal.           Behavior: Behavior is cooperative.          Thought Content: Thought content normal.          Cognition and Memory: Cognition normal.          Judgment: Judgment normal.           MDM   Number of Diagnoses or Management Options   Acute cystitis without hematuria   Nausea and vomiting, unspecified vomiting  type   Type 2 diabetes mellitus with hyperglycemia,  with long-term current use of insulin ([place])   Diagnosis management comments: Patient presented with abdominal pain, nausea, vomiting, chills.  Creatinine is mildly elevated.  UA shows small leuks, neg nitrites, 10-20 white blood cells, bacteria.  Urine culture sent.  Will start antibiotics.  CT shows  gas in the bladder could be seen in cystitis.  This is likely consistent with UA findings.  No other acute abnormalities noted.  Patient continues to be tachycardic and ill-appearing.  Will admit for IV antibiotics and further evaluation management.   Patient understands agrees with plan.  All questions addressed and answered.      [facility] Consult for Admission   9:32 PM      ED Room Number: ER14/14   Patient Name and age:  Melinda [name] 40 y.o.  female   Working Diagnosis: Acute cystitis without hematuria  (primary encounter diagnosis)   Nausea and vomiting, unspecified vomiting type   Type 2 diabetes mellitus with hyperglycemia, with long-term current use of insulin ([place])      COVID-19 Suspicion:  no   Sepsis  present:  no  Reassessment needed: yes   Code Status:  Full Code   Readmission: no   Isolation Requirements:  no   Recommended Level of Care:  med/surg   Department:[facility] - [phone]   Other:                  Amount and/or Complexity of Data Reviewed   Clinical lab tests: reviewed   Tests in the radiology section of CPT?? : reviewed   Discuss the patient with other providers:  yes (Dr. Leotis Shames, ED Attending )           ED Course as of 07/08/21 2130       Wed Jul 08, 2021        1809  EKG at 1804 shows sinus tachycardia 110 bpm no ST  elevations and no ectopy. [VM]     1832  CBC WITH AUTOMATED DIFF(!):     WBC 9.3    RBC 4.31    HGB 12.5    HCT 36.7    MCV 85.2    MCH 29.0    MCHC 34.1    RDW 12.4    PLATELET 428(!)    MPV 9.6    NRBC 0.0    ABSOLUTE NRBC 0.00    NEUTROPHILS 61    LYMPHOCYTES 29    MONOCYTES 6    EOSINOPHILS 3    BASOPHILS 1    IMMATURE GRANULOCYTES 0    ABS. NEUTROPHILS 5.7    ABS. LYMPHOCYTES 2.7    ABS. MONOCYTES 0.6    ABS. EOSINOPHILS 0.3    ABS. BASOPHILS 0.1    ABS. IMM. GRANS. 0.0    DF AUTOMATED [KG]        1832  COMPREHENSIVE METABOLIC PANEL(!):     Sodium 133(!)    Potassium 3.6    Chloride 96(!)    CO2 29    Anion gap 8    Glucose 393(!)    BUN 26(!)    Creatinine 1.15(!)    BUN/Creatinine ratio 23(!)    GFR est AA >60    GFR est non-AA 53(!)    Calcium 9.6    Bilirubin, total 0.2    ALT 20    AST 12(!)    Alk. phosphatase 105    Protein, total 8.0    Albumin 3.2(!)    Globulin 4.8(!)    A-G Ratio 0.7(!) [KG]        1832  LIPASE:     Lipase 178 [KG]     1841  TROPONIN-HIGH SENSITIVITY:     Troponin-High Sensitivity 4 [KG]     2101  CT ABD PELV W CONT: IMPRESSION       1. Gas within the bladder. This may be due to recent instrumentation, or could   be seen in cystitis/UTI.   2. Otherwise normal CT of the abdomen and pelvis.   3. Of note, as the patient vomited immediately after contrast administration,   positioning was limited. Superior aspect of the liver has been excluded from   view. [KG]     2130  URINALYSIS W/MICROSCOPIC(!):     Color YELLOW/STRAW    Appearance TURBID(!)    Specific gravity 1.017    pH (UA) 6.0    Protein 100(!)    Glucose >1,000(!)    Ketone Negative    Bilirubin Negative    Blood MODERATE(!)    Urobilinogen 0.2    Nitrites Negative  Leukocyte Esterase SMALL(!)    WBC 10-20    RBC 0-5    Epithelial cells MANY(!)    Bacteria 4+(!) [KG]              ED Course User Index   [KG] Coralee Rud, [place]   [VM] [place], Orie Rout, MD           Procedures

## 2021-07-08 NOTE — ED Notes (Signed)
Attempted to call report to 5th floor RN

## 2021-07-08 NOTE — H&P (Signed)
[facility]  [address] [place], [place] [address]  [phone]    Admission History and Physical      NAME:  Melinda [name]   DOB:   [birth year]   MRN:  [id number]     PCP:  None     Date/Time of service:  07/08/2021  10:06 PM        Subjective:     CHIEF COMPLAINT: Nausea vomiting and abdominal pain     HISTORY OF PRESENT ILLNESS:     Ms. Cyndie Mull is a 40 y.o.   with a past medical history significant for diabetes mellitus type 2, essential hypertension presents to the emergency department with the above complaint.  According to the patient she has been having abdominal pain for the past couple of days.  In addition this has been accompanied by nausea and vomiting.  The pain is in the epigastric region and is a stabbing pain that comes on randomly.  The pain is not associated with food intake.  She has not seen any blood in her vomitus. Today she has not been able to tolerate much p.o. intake due to the nausea and vomiting.  Patient presented to the emergency department.  CT abdomen was essentially within normal limits.    No Known Allergies    Prior to Admission medications    Medication Sig Start Date End Date Taking? Authorizing Provider   hydroCHLOROthiazide (HYDRODIURIL) 25 mg tablet Take 25 mg by mouth daily. 04/20/21   Provider, Historical   NovoLIN 70/30 U-100 Insulin 100 unit/mL (70-30) injection 20 Units by SubCUTAneous route daily. 17/30 04/30/21   Provider, Historical   metFORMIN ER 1,000 mg tr24 Take 1,000 mg by mouth two (2) times a day.    Provider, Historical   SITagliptin (Januvia) 25 mg tablet Take 25 mg by mouth daily. Dose needs to be Verified    Provider, Historical   Fenofibrate Nanocrystallized 160 mg tab Take 160 mg by mouth daily. 06/22/21   Doloresco, Loraine Leriche, MD   metoprolol succinate (TOPROL-XL) 25 mg XL tablet Take 1 Tablet by mouth daily. 06/22/21   Doloresco, Loraine Leriche, MD       Past Medical History:   Diagnosis Date    High blood pressure     High cholesterol      Type 2 diabetes mellitus (HCC)     Controled        Past Surgical History:   Procedure Laterality Date    HX OTHER SURGICAL Left     Rentia attachment       Social History     Tobacco Use    Smoking status: Never     Passive exposure: Never    Smokeless tobacco: Never   Substance Use Topics    Alcohol use: Never         Family history -reviewed with the patient is not significant patient case of urinary tract infection epigastric pain.    Review of Systems:  (bold if positive, if negative)    Gen:  Eyes:  ENT:  CVS:  Pulm:  GI:  bdominal pain, nausea, emesis,GU:  MS:  Skin:  Psych:  Endo:  Hem:  Renal:  Neuro:          Objective:      VITALS:    Vital signs reviewed; most recent are:    Visit Vitals  BP (!) 167/108   Pulse (!) 103   Temp 98.6 ??F (37 ??C)  Resp (!) 7   Ht 4\' 11"  (1.499 m)   Wt 56.7 kg (125 lb)   SpO2 97%   BMI 25.25 kg/m??     SpO2 Readings from Last 6 Encounters:   07/08/21 97%   06/22/21 99%          Intake/Output Summary (Last 24 hours) at 07/08/2021 2206  Last data filed at 07/08/2021 1932  Gross per 24 hour   Intake 1000 ml   Output --   Net 1000 ml        Exam:     Physical Exam:    Gen:  Well-developed, well-nourished, in no acute distress  HEENT:  Pink conjunctivae, PERRL, hearing intact to voice, moist mucous membranes  Neck:  Supple, without masses, thyroid non-tender  Resp:  No accessory muscle use, clear breath sounds without wheezes rales or rhonchi  Card:  No murmurs, normal S1, S2 without thrills, bruits or peripheral edema  Abd:  Soft, tender  In epigastric region, non-distended, normoactive bowel sounds are present, no palpable organomegaly and no detectable hernias  Lymph:  No cervical adenopathy  Musc:  No cyanosis or clubbing  Skin:  No rashes or ulcers, skin turgor is good  Neuro:  Cranial nerves 3-12 are grossly intact, grip strength is 5/5 bilaterally, dorsi / plantarflexion strength is 5/5 bilaterally, follows commands appropriately  Psych:  Alert with good insight.  Oriented  to person, place, and time      Labs:    Recent Labs     07/08/21  1747   WBC 9.3   HGB 12.5   HCT 36.7   PLT 428*     Recent Labs     07/08/21  1747   NA 133*   K 3.6   CL 96*   CO2 29   GLU 393*   BUN 26*   CREA 1.15*   CA 9.6   ALB 3.2*   TBILI 0.2   ALT 20     No results found for: GLUCPOC  No results for input(s): PH, PCO2, PO2, HCO3, FIO2 in the last 72 hours.  No results for input(s): INR, INREXT in the last 72 hours.    Radiology and EKG reviewed:      CT abdomen shows no significant acute findings. gas within the   Urinary bladderwhich could be observed with UTI.  **Old Records reviewed in [facility]**       Assessment/Plan:       Active Problems:    Nausea and vomiting (07/08/2021)    Likely secondary to the abdominal pain.  Abdominal   Pain is sharp in nature.    -Zofran q.8 hours as needed   - Phenergan as second-line    - NPO now     Hyperglycemia with diabetes mellitus type 2 on long-term insulin use  - Sliding scale insulin provided with Accu-Cheks before meals and at bedtime.  Diabetic diet started.    Acute cystitis without hematuria  Urinalysis shows urinary tract infection.    -Continue ceftriaxone   -IV fluid hydration given the nausea and vomiting as well     Epigastric abdominal pain  -Check lipase level  -possible gastric ulcer will start on Protonix IV 40 mg b.I.d.  -Start on Carafate   - monitor hemoglobin   - possible outpatient follow-up for upper endoscopy if hemoglobin remains stable    Risk of deterioration: low      Total time spent with patient: 30 Minutes **I personally saw  and examined the patient during this time period**                 Care Plan discussed with: Patient and Family    Discussed:  Code Status    Prophylaxis:  Lovenox    Probable Disposition:  Home w/Family           ___________________________________________________    Attending Physician: Legrand Como, MD

## 2021-07-08 NOTE — ED Notes (Signed)
 Spanish interpretor used in triage: [phone].    Patient arrives with c/o mid-epigastric abdominal pain radiating to mid-back x 3 days. Reports began having N/V, chills, and generalized headache.     Denies having chest pain in triage, but states may have had chest pain earlier. Patient states is unsure if she was dreaming due to being asleep at the time.     BP in triage: 175/95 with HR: 122. Larraine, [place] in triage assessing patient.     Patient arrives with Holter monitor in triage.

## 2021-07-09 LAB — GLUCOSE, POC
Glucose (POC): 305 mg/dL — ABNORMAL HIGH (ref 65–117)
Glucose (POC): 260 mg/dL — ABNORMAL HIGH (ref 65–117)
Glucose (POC): 176 mg/dL — ABNORMAL HIGH (ref 65–117)
Glucose (POC): 282 mg/dL — ABNORMAL HIGH (ref 65–117)

## 2021-07-09 LAB — METABOLIC PANEL, BASIC
Anion gap: 7 mmol/L (ref 5–15)
BUN/Creatinine ratio: 24 — ABNORMAL HIGH (ref 12–20)
BUN: 18 MG/DL (ref 6–20)
CO2: 28 mmol/L (ref 21–32)
Calcium: 8.5 MG/DL (ref 8.5–10.1)
Chloride: 104 mmol/L (ref 97–108)
Creatinine: 0.76 MG/DL (ref 0.55–1.02)
GFR est AA: >60 mL/min/{1.73_m2} (ref >60)
GFR est non-AA: >60 mL/min/{1.73_m2} (ref >60)
Glucose: 222 mg/dL — ABNORMAL HIGH (ref 65–100)
Potassium: 3.2 mmol/L — ABNORMAL LOW (ref 3.5–5.1)
Sodium: 139 mmol/L (ref 136–145)

## 2021-07-09 LAB — HEMOGLOBIN A1C WITH EAG
Est. average glucose: 235 mg/dL
Hemoglobin A1c: 9.8 % — ABNORMAL HIGH (ref 4.0–5.6)

## 2021-07-09 LAB — POCT GLUCOSE
POC Glucose: 305 mg/dL — ABNORMAL HIGH (ref 65–117)
POC Glucose: 260 mg/dL — ABNORMAL HIGH (ref 65–117)
POC Glucose: 176 mg/dL — ABNORMAL HIGH (ref 65–117)
POC Glucose: 282 mg/dL — ABNORMAL HIGH (ref 65–117)

## 2021-07-09 LAB — BASIC METABOLIC PANEL
Anion Gap: 7 mmol/L (ref 5–15)
BUN: 18 MG/DL (ref 6–20)
Bun/Cre Ratio: 24 — ABNORMAL HIGH (ref 12–20)
CO2: 28 mmol/L (ref 21–32)
Calcium: 8.5 MG/DL (ref 8.5–10.1)
Chloride: 104 mmol/L (ref 97–108)
Creatinine: 0.76 MG/DL (ref 0.55–1.02)
EGFR IF NonAfrican American: >60 mL/min/{1.73_m2} (ref >60)
GFR African American: >60 mL/min/{1.73_m2} (ref >60)
Glucose: 222 mg/dL — ABNORMAL HIGH (ref 65–100)
Potassium: 3.2 mmol/L — ABNORMAL LOW (ref 3.5–5.1)
Sodium: 139 mmol/L (ref 136–145)

## 2021-07-09 LAB — HEMOGLOBIN A1C W/EAG
Hemoglobin A1C: 9.8 % — ABNORMAL HIGH (ref 4.0–5.6)
eAG: 235 mg/dL

## 2021-07-09 MED ORDER — IOPAMIDOL 76 % IV SOLN
76 % | Freq: Once | INTRAVENOUS | Status: AC
Start: 2021-07-09 — End: 2021-07-08
  Administered 2021-07-09: via INTRAVENOUS

## 2021-07-09 MED ORDER — METOCLOPRAMIDE 5 MG/ML IJ SOLN
5 mg/mL | INTRAMUSCULAR | Status: AC
Start: 2021-07-09 — End: 2021-07-08
  Administered 2021-07-09: 01:00:00 via INTRAVENOUS

## 2021-07-09 MED ORDER — ONDANSETRON (PF) 4 MG/2 ML INJECTION
4 mg/2 mL | Freq: Four times a day (QID) | INTRAMUSCULAR | Status: AC | PRN
Start: 2021-07-09 — End: 2021-07-10
  Administered 2021-07-09: 04:00:00 via INTRAVENOUS

## 2021-07-09 MED ORDER — POLYETHYLENE GLYCOL 3350 17 GRAM (100 %) ORAL POWDER PACKET
17 gram | Freq: Every day | ORAL | Status: AC | PRN
Start: 2021-07-09 — End: 2021-07-10

## 2021-07-09 MED ORDER — DEXTROSE 10% IN WATER (D10W) IV
10 % | INTRAVENOUS | Status: DC | PRN
Start: 2021-07-09 — End: 2021-07-10

## 2021-07-09 MED ORDER — ENOXAPARIN 40 MG/0.4 ML SUB-Q SYRINGE
40 mg/0.4 mL | Freq: Every day | SUBCUTANEOUS | Status: AC
Start: 2021-07-09 — End: 2021-07-10
  Administered 2021-07-09 – 2021-07-10 (×2): via SUBCUTANEOUS

## 2021-07-09 MED ORDER — SERTRALINE 25 MG TAB
25 mg | Freq: Every day | ORAL | Status: AC
Start: 2021-07-09 — End: 2021-07-10
  Administered 2021-07-10: 12:00:00 via ORAL

## 2021-07-09 MED ORDER — PROCHLORPERAZINE EDISYLATE 5 MG/ML INJECTION
5 mg/mL | Freq: Four times a day (QID) | INTRAMUSCULAR | Status: DC | PRN
Start: 2021-07-09 — End: 2021-07-10
  Administered 2021-07-09: 06:00:00 via INTRAVENOUS

## 2021-07-09 MED ORDER — HYDROXYZINE 25 MG TAB
25 mg | Freq: Three times a day (TID) | ORAL | Status: AC | PRN
Start: 2021-07-09 — End: 2021-07-10
  Administered 2021-07-09: 20:00:00 via ORAL

## 2021-07-09 MED ORDER — HYDRALAZINE 20 MG/ML IJ SOLN
20 mg/mL | Freq: Four times a day (QID) | INTRAMUSCULAR | Status: DC | PRN
Start: 2021-07-09 — End: 2021-07-10
  Administered 2021-07-09: 03:00:00 via INTRAVENOUS

## 2021-07-09 MED ORDER — ACETAMINOPHEN 325 MG TABLET
325 mg | Freq: Four times a day (QID) | ORAL | Status: DC | PRN
Start: 2021-07-09 — End: 2021-07-10
  Administered 2021-07-09: 15:00:00 via ORAL

## 2021-07-09 MED ORDER — PANTOPRAZOLE 40 MG IV SOLR
40 mg | Freq: Two times a day (BID) | INTRAVENOUS | Status: AC
Start: 2021-07-09 — End: 2021-07-10
  Administered 2021-07-09 – 2021-07-10 (×4): via INTRAVENOUS

## 2021-07-09 MED ORDER — SODIUM CHLORIDE 0.9 % INJECTION
1 gram | INTRAMUSCULAR | Status: AC
Start: 2021-07-09 — End: 2021-07-08
  Administered 2021-07-09: 03:00:00 via INTRAVENOUS

## 2021-07-09 MED ORDER — ONDANSETRON 4 MG TAB, RAPID DISSOLVE
4 mg | Freq: Three times a day (TID) | ORAL | Status: AC | PRN
Start: 2021-07-09 — End: 2021-07-10

## 2021-07-09 MED ORDER — POTASSIUM CHLORIDE 10 MEQ/100 ML IV PIGGY BACK
10 mEq/0 mL | INTRAVENOUS | Status: DC
Start: 2021-07-09 — End: 2021-07-09

## 2021-07-09 MED ORDER — CEFTRIAXONE 1 GRAM SOLUTION FOR INJECTION
1 gram | INTRAMUSCULAR | Status: DC
Start: 2021-07-09 — End: 2021-07-10
  Administered 2021-07-10: 01:00:00 via INTRAVENOUS

## 2021-07-09 MED ORDER — SODIUM CHLORIDE 0.9 % IV
INTRAVENOUS | Status: AC
Start: 2021-07-09 — End: 2021-07-09
  Administered 2021-07-09: 04:00:00 via INTRAVENOUS

## 2021-07-09 MED ORDER — SODIUM CHLORIDE 0.9 % IJ SYRG
INTRAMUSCULAR | Status: AC | PRN
Start: 2021-07-09 — End: 2021-07-10

## 2021-07-09 MED ORDER — INSULIN LISPRO 100 UNIT/ML INJECTION
100 unit/mL | Freq: Four times a day (QID) | SUBCUTANEOUS | Status: AC
Start: 2021-07-09 — End: 2021-07-09
  Administered 2021-07-09 (×3): via SUBCUTANEOUS

## 2021-07-09 MED ORDER — POTASSIUM CHLORIDE 10 MEQ/100 ML IV PIGGY BACK
10 mEq/0 mL | INTRAVENOUS | Status: DC
Start: 2021-07-09 — End: 2021-07-09
  Administered 2021-07-09: 16:00:00 via INTRAVENOUS

## 2021-07-09 MED ORDER — POTASSIUM CHLORIDE SR 10 MEQ TAB
10 mEq | ORAL | Status: AC
Start: 2021-07-09 — End: 2021-07-09
  Administered 2021-07-09: 23:00:00 via ORAL

## 2021-07-09 MED ORDER — GLUCOSE 4 GRAM CHEWABLE TAB
4 gram | ORAL | Status: AC | PRN
Start: 2021-07-09 — End: 2021-07-10

## 2021-07-09 MED ORDER — SUCRALFATE 1 GRAM TAB
1 gram | Freq: Four times a day (QID) | ORAL | Status: AC
Start: 2021-07-09 — End: 2021-07-10
  Administered 2021-07-09 – 2021-07-10 (×6): via ORAL

## 2021-07-09 MED ORDER — 1/2 NS WITH POTASSIUM CHLORIDE 20 MEQ/L IV
20 mEq/L | INTRAVENOUS | Status: AC
Start: 2021-07-09 — End: 2021-07-10
  Administered 2021-07-09: 16:00:00 via INTRAVENOUS

## 2021-07-09 MED ORDER — SODIUM CHLORIDE 0.9 % IJ SYRG
Freq: Three times a day (TID) | INTRAMUSCULAR | Status: DC
Start: 2021-07-09 — End: 2021-07-10
  Administered 2021-07-09 – 2021-07-10 (×6): via INTRAVENOUS

## 2021-07-09 MED ORDER — MORPHINE 2 MG/ML INJECTION
2 mg/mL | INTRAMUSCULAR | Status: DC | PRN
Start: 2021-07-09 — End: 2021-07-10
  Administered 2021-07-09 (×2): via INTRAVENOUS

## 2021-07-09 MED ORDER — GLUCAGON 1 MG INJECTION
1 mg | INTRAMUSCULAR | Status: AC | PRN
Start: 2021-07-09 — End: 2021-07-10

## 2021-07-09 MED ORDER — ACETAMINOPHEN 650 MG RECTAL SUPPOSITORY
650 mg | Freq: Four times a day (QID) | RECTAL | Status: DC | PRN
Start: 2021-07-09 — End: 2021-07-10

## 2021-07-09 MED FILL — ACETAMINOPHEN 325 MG TABLET: 325 mg | ORAL | Qty: 2

## 2021-07-09 MED FILL — HYDROXYZINE 25 MG TAB: 25 mg | ORAL | Qty: 1

## 2021-07-09 MED FILL — INSULIN LISPRO 100 UNIT/ML INJECTION: 100 unit/mL | SUBCUTANEOUS | Qty: 1

## 2021-07-09 MED FILL — SUCRALFATE 1 GRAM TAB: 1 gram | ORAL | Qty: 1

## 2021-07-09 MED FILL — PROTONIX 40 MG INTRAVENOUS SOLUTION: 40 mg | INTRAVENOUS | Qty: 40

## 2021-07-09 MED FILL — POTASSIUM CHLORIDE 10 MEQ/100 ML IV PIGGY BACK: 10 mEq/0 mL | INTRAVENOUS | Qty: 100

## 2021-07-09 MED FILL — SODIUM CHLORIDE 0.9 % IV: INTRAVENOUS | Qty: 250

## 2021-07-09 MED FILL — ISOVUE-370  76 % INTRAVENOUS SOLUTION: 370 mg iodine /mL (76 %) | INTRAVENOUS | Qty: 100

## 2021-07-09 MED FILL — ENOXAPARIN 40 MG/0.4 ML SUB-Q SYRINGE: 40 mg/0.4 mL | SUBCUTANEOUS | Qty: 0.4

## 2021-07-09 MED FILL — BD POSIFLUSH NORMAL SALINE 0.9 % INJECTION SYRINGE: INTRAMUSCULAR | Qty: 40

## 2021-07-09 MED FILL — 1/2 NS WITH POTASSIUM CHLORIDE 20 MEQ/L IV: 20 mEq/L | INTRAVENOUS | Qty: 1000

## 2021-07-09 MED FILL — METOCLOPRAMIDE 5 MG/ML IJ SOLN: 5 mg/mL | INTRAMUSCULAR | Qty: 2

## 2021-07-09 MED FILL — PROCHLORPERAZINE EDISYLATE 5 MG/ML INJECTION: 5 mg/mL | INTRAMUSCULAR | Qty: 2

## 2021-07-09 MED FILL — HYDRALAZINE 20 MG/ML IJ SOLN: 20 mg/mL | INTRAMUSCULAR | Qty: 1

## 2021-07-09 MED FILL — MORPHINE 2 MG/ML INJECTION: 2 mg/mL | INTRAMUSCULAR | Qty: 1

## 2021-07-09 MED FILL — POTASSIUM CHLORIDE SR 10 MEQ TAB: 10 mEq | ORAL | Qty: 2

## 2021-07-09 MED FILL — ONDANSETRON (PF) 4 MG/2 ML INJECTION: 4 mg/2 mL | INTRAMUSCULAR | Qty: 2

## 2021-07-09 MED FILL — CEFTRIAXONE 1 GRAM SOLUTION FOR INJECTION: 1 gram | INTRAMUSCULAR | Qty: 1

## 2021-07-09 NOTE — Progress Notes (Signed)
Problem: Falls - Risk of  Goal: *Absence of Falls  Description: Document Melinda [name] Fall Risk and appropriate interventions in the flowsheet.  Outcome: Progressing Towards Goal  Note: Fall Risk Interventions:            Medication Interventions: Bed/chair exit alarm, Patient to call before getting OOB, Teach patient to arise slowly, Utilize gait belt for transfers/ambulation                   Problem: Patient Education: Go to Patient Education Activity  Goal: Patient/Family Education  Outcome: Progressing Towards Goal     Problem: General Infection Care Plan (Adult and Pediatric)  Goal: Improvement in signs and symptoms of infection  Outcome: Progressing Towards Goal  Goal: *Optimize nutritional status  Outcome: Progressing Towards Goal     Problem: Patient Education: Go to Patient Education Activity  Goal: Patient/Family Education  Outcome: Progressing Towards Goal

## 2021-07-09 NOTE — Progress Notes (Signed)
Spiritual Care Assessment/Progress Note  [facility]      NAME: Melinda [name]      MRN: [id number]  AGE: 40 y.o. SEX: female  Religious Affiliation: Catholic   Language: Spanish     07/09/2021     Total Time (in minutes): 5     Spiritual Assessment begun in [facility] 5M1 MED SURG 1 through conversation with:         [x] Patient        []  Family    []  Friend(s)        Reason for Consult:     Spiritual beliefs: (Please include comment if needed)     [x]  Identifies with a faith tradition:  Catholic       []  Supported by a faith community:            []  Claims no spiritual orientation:           []  Seeking spiritual identity:                []  Adheres to an individual form of spirituality:           []  Not able to assess:                           Identified resources for coping:      [x]  Prayer                               [x]  Music                  []  Guided Imagery     [x]  Family/friends                 []  Pet visits     []  Devotional reading                         []  Unknown     []  Other:                                               Interventions offered during this visit: (See comments for more details)    Patient Interventions: Affirmation of faith, Communion ), Initial/Spiritual assessment, patient floor, Prayer (actual), Prayer (assurance of)           Plan of Care:     [x]  Support spiritual and/or cultural needs    []  Support AMD and/or advance care planning process      []  Support grieving process   []  Coordinate Rites and/or Rituals    []  Coordination with community clergy   []  No spiritual needs identified at this time   []  Detailed Plan of Care below (See Comments)  []  Make referral to Music Therapy  []  Make referral to Pet Therapy     []  Make referral to Addiction services  []  Make referral to [facility]  []  Make referral to Spiritual Care Partner  []  No future visits requested        []  Contact Spiritual Care for further referrals     Comments: Mrs. Housand  was in bed.  She declined communion today.  Prayer for healing offered.    Chaplain Sr. Oceanographer, SBS,  RN, ACSW, LCSW  Chaplain Page:  [phone])

## 2021-07-09 NOTE — Progress Notes (Deleted)
Problem: Falls - Risk of  Goal: *Absence of Falls  Description: Document Melinda [name] Fall Risk and appropriate interventions in the flowsheet.  Outcome: Progressing Towards Goal  Note: Fall Risk Interventions:            Medication Interventions: Bed/chair exit alarm                   Problem: Patient Education: Go to Patient Education Activity  Goal: Patient/Family Education  Outcome: Progressing Towards Goal     Problem: General Infection Care Plan (Adult and Pediatric)  Goal: Improvement in signs and symptoms of infection  Outcome: Progressing Towards Goal  Goal: *Optimize nutritional status  Outcome: Progressing Towards Goal     Problem: Patient Education: Go to Patient Education Activity  Goal: Patient/Family Education  Outcome: Progressing Towards Goal     Problem: Diabetes Self-Management  Goal: *Disease process and treatment process  Description: Define diabetes and identify own type of diabetes; list 3 options for treating diabetes.  Outcome: Progressing Towards Goal  Goal: *Incorporating nutritional management into lifestyle  Description: Describe effect of type, amount and timing of food on blood glucose; list 3 methods for planning meals.  Outcome: Progressing Towards Goal  Goal: *Incorporating physical activity into lifestyle  Description: State effect of exercise on blood glucose levels.  Outcome: Progressing Towards Goal  Goal: *Developing strategies to promote health/change behavior  Description: Define the ABC's of diabetes; identify appropriate screenings, schedule and personal plan for screenings.  Outcome: Progressing Towards Goal  Goal: *Using medications safely  Description: State effect of diabetes medications on diabetes; name diabetes medication taking, action and side effects.  Outcome: Progressing Towards Goal  Goal: *Monitoring blood glucose, interpreting and using results  Description: Identify recommended blood glucose targets  and personal targets.  Outcome: Progressing Towards  Goal  Goal: *Prevention, detection, treatment of acute complications  Description: List symptoms of hyper- and hypoglycemia; describe how to treat low blood sugar and actions for lowering  high blood glucose level.  Outcome: Progressing Towards Goal  Goal: *Prevention, detection and treatment of chronic complications  Description: Define the natural course of diabetes and describe the relationship of blood glucose levels to long term complications of diabetes.  Outcome: Progressing Towards Goal  Goal: *Developing strategies to address psychosocial issues  Description: Describe feelings about living with diabetes; identify support needed and support network  Outcome: Progressing Towards Goal  Goal: *Insulin pump training  Outcome: Progressing Towards Goal  Goal: *Sick day guidelines  Outcome: Progressing Towards Goal  Goal: *Patient Specific Goal (EDIT GOAL, INSERT TEXT)  Outcome: Progressing Towards Goal     Problem: Patient Education: Go to Patient Education Activity  Goal: Patient/Family Education  Outcome: Progressing Towards Goal

## 2021-07-09 NOTE — Progress Notes (Signed)
0730: Bedside and Verbal shift change report given to Shakiyla,RN (oncoming nurse) by Sarah.O,RN (offgoing nurse). Report included the following information SBAR, Kardex, MAR, Accordion, and Recent Results.
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
## 2021-07-09 NOTE — Consults (Signed)
Consults by Mariea Stable, NP at 07/09/21 1414                Author: Mariea Stable, NP  Service: Gastroenterology  Author Type: Nurse Practitioner       Filed: 07/09/21 1440  Date of Service: 07/09/21 1414  Status: Addendum          Editor: Mariea Stable, NP (Nurse Practitioner)          Related Notes: Original Note by Cherly Beach (nurse practitioner student) filed at 07/09/21  1430            Consult Orders        1. IP CONSULT TO GASTROENTEROLOGY [[id number]] ordered by Horald Chestnut, DO at 07/09/21 0727                                 [place] [facility]   Mariea Stable NP   [phone]                       GASTROENTEROLOGY CONSULTATION NOTE                      NAME:  Melinda [name]    DOB:   06-16-1981    MRN:   [id number]          Referring Physician:    Dr. Arna Medici       Consult Date:    07/09/2021 2:15 PM      Chief Complaint:     Abdominal pain with nausea and vomiting        History of Present Illness:     Patient is a 40 y.o. who presents with a past medical history of diabetes mellitus type 2, hypertension and hyperlipidemia.  According to the patient, she has had worsening abdominal pain the last two days with vomiting in the morning, lasting all day  and occasionally at night. She reports having abdominal pain for the last two years. The abdominal pain is in the epigastric that moves to her back bilaterally. Patient reports the pain can be sharp and stabbing. Per the patient, milk and avocados worsen  the pain. Patient reports she is not taking anything for the pain. She denies blood in her vomit. Patient reports vomit is yellow. Reports having an appetite but has not eaten today due to NPO order. Interpreter services used during appointment.         PMH:     Past Medical History:        Diagnosis  Date         ?  High blood pressure       ?  High cholesterol       ?  Type 2 diabetes mellitus (HCC)            Controled           PSH:     Past Surgical History:         Procedure   Laterality  Date          ?  HX OTHER SURGICAL  Left            Rentia attachment           Allergies:   No Known Allergies      Home Medications:     Prior to Admission Medications     Prescriptions  Last Dose  Informant  Patient Reported?  Taking?      Fenofibrate Nanocrystallized 160 mg tab  07/08/2021    No  Yes      Sig: Take 160 mg by mouth daily.      NovoLIN 70/30 U-100 Insulin 100 unit/mL (70-30) injection  07/07/2021    Yes  No      Sig: 20 Units by SubCUTAneous route daily. 17/30      SITagliptin (JANUVIA) 25 mg tablet  07/08/2021    Yes  Yes      Sig: Take 100 mg by mouth daily. Dose needs to be Verified      ferrous sulfate 325 mg (65 mg iron) tablet  07/08/2021    Yes  Yes      Sig: Take  by mouth Daily (before breakfast).      glipiZIDE (GLUCOTROL) 10 mg tablet  07/08/2021    Yes  Yes      Sig: Take 10 mg by mouth two (2) times a day.      hydroCHLOROthiazide (HYDRODIURIL) 25 mg tablet  07/08/2021    Yes  Yes      Sig: Take 25 mg by mouth daily.      metFORMIN ER 1,000 mg tr24  07/08/2021    Yes  Yes      Sig: Take 500 mg by mouth two (2) times a day.      metoprolol succinate (TOPROL-XL) 25 mg XL tablet  07/07/2021    No  No      Sig: Take 1 Tablet by mouth daily.               Facility-Administered Medications: None           Hospital Medications:     Current Facility-Administered Medications          Medication  Dose  Route  Frequency           ?  prochlorperazine (COMPAZINE) injection 10 mg   10 mg  IntraVENous  Q6H PRN     ?  0.45% sodium chloride with KCl 20 mEq/L infusion     IntraVENous  CONTINUOUS     ?  potassium chloride 10 mEq in 100 ml IVPB   10 mEq  IntraVENous  Q1H     ?  sodium chloride (NS) flush 5-40 mL   5-40 mL  IntraVENous  Q8H     ?  sodium chloride (NS) flush 5-40 mL   5-40 mL  IntraVENous  PRN     ?  acetaminophen (TYLENOL) tablet 650 mg   650 mg  Oral  Q6H PRN          Or           ?  acetaminophen (TYLENOL) suppository 650 mg   650 mg  Rectal  Q6H PRN     ?  polyethylene glycol  (MIRALAX) packet 17 g   17 g  Oral  DAILY PRN           ?  ondansetron (ZOFRAN ODT) tablet 4 mg   4 mg  Oral  Q8H PRN          Or           ?  ondansetron (ZOFRAN) injection 4 mg   4 mg  IntraVENous  Q6H PRN     ?  enoxaparin (LOVENOX) injection 40 mg   40 mg  SubCUTAneous  DAILY     ?  cefTRIAXone (  ROCEPHIN) 1 g in 0.9% sodium chloride 10 mL IV syringe   1 g  IntraVENous  Q24H     ?  pantoprazole (PROTONIX) 40 mg in 0.9% sodium chloride 10 mL injection   40 mg  IntraVENous  Q12H     ?  sucralfate (CARAFATE) tablet 1 g   1 g  Oral  AC&HS     ?  glucose chewable tablet 16 g   4 Tablet  Oral  PRN     ?  glucagon (GLUCAGEN) injection 1 mg   1 mg  IntraMUSCular  PRN     ?  dextrose 10% infusion 0-250 mL   0-250 mL  IntraVENous  PRN     ?  insulin lispro (HUMALOG) injection     SubCUTAneous  Q6H     ?  hydrALAZINE (APRESOLINE) 20 mg/mL injection 10 mg   10 mg  IntraVENous  Q6H PRN           ?  morphine injection 1 mg   1 mg  IntraVENous  Q4H PRN           Social History:     Social History          Tobacco Use         ?  Smoking status:  Never              Passive exposure:  Never         ?  Smokeless tobacco:  Never       Substance Use Topics         ?  Alcohol use:  Never           Family History:   No family history on file.      Review of Systems:   Constitutional: negative fever, negative chills, negative weight loss   Eyes:   negative visual changes   ENT:   negative sore throat, tongue or lip swelling   Respiratory:  negative cough, negative dyspnea   Cards:  negative for chest pain, palpitations, lower extremity edema   GI:   See HPI, positive for abdominal pain, positive for nausea and vomiting    GU:  negative for frequency, dysuria   Integument:  negative for rash and pruritus   Heme:  negative for easy bruising and gum/nose bleeding   Musculoskel: negative for myalgias,  back pain and muscle weakness   Neuro: negative for headaches, dizziness, vertigo   Psych:  negative for feelings of anxiety, depression          Objective:     Patient Vitals for the past 8 hrs:            BP  Temp  Pulse  Resp  SpO2            07/09/21 1200  --  --  100  --  --            07/09/21 1049  (!) 152/102  --  --  --  --     07/09/21 1048  (!) 161/98  97.8 ??F (36.6 ??C)  100  16  99 %     07/09/21 0800  --  --  97  --  --     07/09/21 0727  112/77  97.7 ??F (36.5 ??C)  97  17  98 %            07/09/21 0700  --  --  97  --  --  No intake/output data recorded.   09/27 1901 - 09/29 0700   In: 1000 [I.V.:1000]   Out: 240       EXAM:      NEURO-alert, oriented x3, affect appropriate, no focal neurological deficits, moves all extremities well, no involuntary movements, reflexes at knee and ankle intact    HEENT-Head: Normocephalic, no lesions, without obvious abnormality.    LUNGS-clear to auscultation bilaterally     COR-regular rate and rhythym      ABD- soft, non-tender. Bowel sounds normal. No masses,  no organomegaly, normal findings: no scars, striae, dilated veins, rashes, or lesions, umbilicus normal, symmetric, no masses palpable, no organomegaly, bowel sounds normal, soft, non-tender      EXT-no edema     Skin - No rash       Data Review         Recent Labs           07/08/21   1747     WBC  9.3     HGB  12.5     HCT  36.7        PLT  428*          Recent Labs            07/09/21   0242  07/08/21   1747     NA  139  133*     K  3.2*  3.6     CL  104  96*     CO2  28  29     BUN  18  26*     CREA  0.76  1.15*     GLU  222*  393*         CA  8.5  9.6          Recent Labs           07/08/21   1747     AP  105     TP  8.0     ALB  3.2*     GLOB  4.8*        LPSE  178        No results for input(s): INR, PTP, APTT, INREXT in the last 72 hours.        Patient Active Problem List        Diagnosis  Code         ?  Nausea and vomiting  R11.2           Assessment and Plan:   Epigastric Pain, Vomiting:  Pt with history of poorly controlled diabetes, admitted with UTI.  CT shows Gas within the bladder  and is otherwise normal. Concerning for  gastroparesis, due to uti / sepsis, esophagitis, less likely gastric outlet outlet obstruction.       - Clear Liquids, advance as tolerated.    - Continue supportive care with Zofran   - Educated patient on gastroparesis diet modifications - small, frequent meals low in fat and residue   - Continue acid suppression.    - No plans for EGD at this time.       Following.                      Thanks for allowing me to participate in the care of this patient.      Signed ByIrving Burton League           07/09/2021  2:15 PM

## 2021-07-09 NOTE — Progress Notes (Signed)
Reason for Admission:  Nausea and vomiting                   RUR Score:   Not calculated due to observation status                  Plan for utilizing home health:   Not indicated       PCP: First and Last name:     Name of Practice: [facility]   Are you a current patient: Yes/No: yes   Approximate date of last visit: a month ago   Can you participate in a virtual visit with your PCP:                     Current Advanced Directive/Advance Care Plan: Full Code    Healthcare Decision Maker:   Bearl Mulberry, spouse - [phone]                      Transition of Care Plan:   Initial assessment completed with patient via Spanish interpreter, Leta Speller.         Charted address and contact information were verified.  Patient lives with her spouse, nephew and three daughters in a one story house with three steps to enter.  Patient reports being independent with ADLs and ambulation.  She relies on family for transportation.  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  Patient is uninsured; CM provided the care card application in Spanish to patient/family.  Patient's medical care is provided by [facility].  Patient said she was referred by them to see a cardiologist which she saw about 3 weeks ago; she is unable to recall the provider's name.  Patient's preferred pharmacy is [facility] in [place].      Admission for medical management  Await further evaluation and response to treatment  GI consulted  Psych consulted   CM will follow    Care Management Interventions  PCP Verified by CM: Yes  Support Systems: Spouse/Significant Other, Child(ren), Other Family Member(s)  The Plan for Transition of Care is Related to the Following Treatment Goals : Nausea and vomiting  Discharge Location  Patient Expects to be Discharged to:: Unable to determine at this time     Alexis Goodell, LCSW

## 2021-07-09 NOTE — Progress Notes (Signed)
07/09/21    State Observation Letter was verbally explained to patient and provided in writing to patient.  The patient signed the document.    Spanish Observation Letter was given to the patient to review and sign.

## 2021-07-09 NOTE — Behavioral Health Treatment Team (Signed)
Called the unit to consult with the patient but there was no response. Will try again at a later time.

## 2021-07-09 NOTE — Progress Notes (Signed)
07/09/2021  Case Management Progress Note    4:45 PM  Spoke with [facility] bed board--patient needs covid test (already alerted MD and nursing), and her BG was a little high on chart review, however per RN she is administering insulin now. This will likely have improved by the time covid test results.     Clelia Schaumann, MSW    4:24 PM  Noted psych consult stating that inpatient psych hospitalization would be beneficial for patient and that she is willing. Called the [facility] bed board and gave them her information. They will call me or if they can't reach me they will call the nurse's station. Discussed with nursing to please order PCR covid for behavioral health placement.     Clelia Schaumann, MSW
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
## 2021-07-10 LAB — METABOLIC PANEL, COMPREHENSIVE
A-G Ratio: 0.8 — ABNORMAL LOW (ref 1.1–2.2)
ALT (SGPT): 16 U/L (ref 12–78)
AST (SGOT): 15 U/L (ref 15–37)
Albumin: 2.8 g/dL — ABNORMAL LOW (ref 3.5–5.0)
Alk. phosphatase: 73 U/L (ref 45–117)
Anion gap: 3 mmol/L — ABNORMAL LOW (ref 5–15)
BUN/Creatinine ratio: 16 (ref 12–20)
BUN: 10 MG/DL (ref 6–20)
Bilirubin, total: 0.3 MG/DL (ref 0.2–1.0)
CO2: 28 mmol/L (ref 21–32)
Calcium: 8.4 MG/DL — ABNORMAL LOW (ref 8.5–10.1)
Chloride: 107 mmol/L (ref 97–108)
Creatinine: 0.63 MG/DL (ref 0.55–1.02)
GFR est AA: >60 mL/min/{1.73_m2} (ref >60)
GFR est non-AA: >60 mL/min/{1.73_m2} (ref >60)
Globulin: 3.6 g/dL (ref 2.0–4.0)
Glucose: 167 mg/dL — ABNORMAL HIGH (ref 65–100)
Potassium: 3.7 mmol/L (ref 3.5–5.1)
Protein, total: 6.4 g/dL (ref 6.4–8.2)
Sodium: 138 mmol/L (ref 136–145)

## 2021-07-10 LAB — CBC WITH AUTOMATED DIFF
ABS. BASOPHILS: 0.1 10*3/uL (ref 0.0–0.1)
ABS. EOSINOPHILS: 0.4 10*3/uL (ref 0.0–0.4)
ABS. IMM. GRANS.: 0 10*3/uL (ref 0.00–0.04)
ABS. LYMPHOCYTES: 3.1 10*3/uL (ref 0.8–3.5)
ABS. MONOCYTES: 0.7 10*3/uL (ref 0.0–1.0)
ABS. NEUTROPHILS: 4.2 10*3/uL (ref 1.8–8.0)
ABSOLUTE NRBC: 0 10*3/uL (ref 0.00–0.01)
BASOPHILS: 1 % (ref 0–1)
EOSINOPHILS: 5 % (ref 0–7)
HCT: 32.2 % — ABNORMAL LOW (ref 35.0–47.0)
HGB: 10.7 g/dL — ABNORMAL LOW (ref 11.5–16.0)
IMMATURE GRANULOCYTES: 0 % (ref 0.0–0.5)
LYMPHOCYTES: 36 % (ref 12–49)
MCH: 28.5 PG (ref 26.0–34.0)
MCHC: 33.2 g/dL (ref 30.0–36.5)
MCV: 85.9 FL (ref 80.0–99.0)
MONOCYTES: 9 % (ref 5–13)
MPV: 9.8 FL (ref 8.9–12.9)
NEUTROPHILS: 49 % (ref 32–75)
NRBC: 0 PER 100 WBC
PLATELET: 390 10*3/uL (ref 150–400)
RBC: 3.75 M/uL — ABNORMAL LOW (ref 3.80–5.20)
RDW: 12.5 % (ref 11.5–14.5)
WBC: 8.5 10*3/uL (ref 3.6–11.0)

## 2021-07-10 LAB — EKG, 12 LEAD, INITIAL
Atrial Rate: 110 {beats}/min
Atrial Rate: 115 {beats}/min
Calculated P Axis: 52 degrees
Calculated P Axis: 55 degrees
Calculated R Axis: -108 degrees
Calculated R Axis: -140 degrees
Calculated T Axis: 47 degrees
Calculated T Axis: 70 degrees
P-R Interval: 140 ms
P-R Interval: 146 ms
Q-T Interval: 370 ms
Q-T Interval: 372 ms
QRS Duration: 88 ms
QRS Duration: 88 ms
QTC Calculation (Bezet): 503 ms
QTC Calculation (Bezet): 511 ms
Ventricular Rate: 110 {beats}/min
Ventricular Rate: 115 {beats}/min

## 2021-07-10 LAB — COVID-19 WITH INFLUENZA A/B
Influenza A By PCR: NOT DETECTED
Influenza A by PCR: NOT DETECTED
Influenza B By PCR: NOT DETECTED
Influenza B by PCR: NOT DETECTED
SARS-CoV-2 by PCR: NOT DETECTED
SARS-CoV-2: NOT DETECTED

## 2021-07-10 LAB — GLUCOSE, POC
Glucose (POC): 224 mg/dL — ABNORMAL HIGH (ref 65–117)
Glucose (POC): 235 mg/dL — ABNORMAL HIGH (ref 65–117)
Glucose (POC): 210 mg/dL — ABNORMAL HIGH (ref 65–117)

## 2021-07-10 LAB — MAGNESIUM
Magnesium: 1.6 mg/dL (ref 1.6–2.4)
Magnesium: 1.6 mg/dL (ref 1.6–2.4)

## 2021-07-10 LAB — EKG 12-LEAD
Atrial Rate: 110 {beats}/min
Atrial Rate: 115 {beats}/min
P Axis: 52 degrees
P Axis: 55 degrees
P-R Interval: 140 ms
P-R Interval: 146 ms
Q-T Interval: 370 ms
Q-T Interval: 372 ms
QRS Duration: 88 ms
QRS Duration: 88 ms
QTc Calculation (Bazett): 503 ms
QTc Calculation (Bazett): 511 ms
R Axis: -108 degrees
R Axis: -140 degrees
T Axis: 47 degrees
T Axis: 70 degrees
Ventricular Rate: 110 {beats}/min
Ventricular Rate: 115 {beats}/min

## 2021-07-10 LAB — CBC WITH AUTO DIFFERENTIAL
Basophils %: 1 % (ref 0–1)
Basophils Absolute: 0.1 10*3/uL (ref 0.0–0.1)
Eosinophils %: 5 % (ref 0–7)
Eosinophils Absolute: 0.4 10*3/uL (ref 0.0–0.4)
Granulocyte Absolute Count: 0 10*3/uL (ref 0.00–0.04)
Hematocrit: 32.2 % — ABNORMAL LOW (ref 35.0–47.0)
Hemoglobin: 10.7 g/dL — ABNORMAL LOW (ref 11.5–16.0)
Immature Granulocytes: 0 % (ref 0.0–0.5)
Lymphocytes %: 36 % (ref 12–49)
Lymphocytes Absolute: 3.1 10*3/uL (ref 0.8–3.5)
MCH: 28.5 PG (ref 26.0–34.0)
MCHC: 33.2 g/dL (ref 30.0–36.5)
MCV: 85.9 FL (ref 80.0–99.0)
MPV: 9.8 FL (ref 8.9–12.9)
Monocytes %: 9 % (ref 5–13)
Monocytes Absolute: 0.7 10*3/uL (ref 0.0–1.0)
NRBC Absolute: 0 10*3/uL (ref 0.00–0.01)
Neutrophils %: 49 % (ref 32–75)
Neutrophils Absolute: 4.2 10*3/uL (ref 1.8–8.0)
Nucleated RBCs: 0 PER 100 WBC
Platelets: 390 10*3/uL (ref 150–400)
RBC: 3.75 M/uL — ABNORMAL LOW (ref 3.80–5.20)
RDW: 12.5 % (ref 11.5–14.5)
WBC: 8.5 10*3/uL (ref 3.6–11.0)

## 2021-07-10 LAB — POCT GLUCOSE
POC Glucose: 224 mg/dL — ABNORMAL HIGH (ref 65–117)
POC Glucose: 235 mg/dL — ABNORMAL HIGH (ref 65–117)
POC Glucose: 210 mg/dL — ABNORMAL HIGH (ref 65–117)

## 2021-07-10 LAB — COMPREHENSIVE METABOLIC PANEL
ALT: 16 U/L (ref 12–78)
AST: 15 U/L (ref 15–37)
Albumin/Globulin Ratio: 0.8 — ABNORMAL LOW (ref 1.1–2.2)
Albumin: 2.8 g/dL — ABNORMAL LOW (ref 3.5–5.0)
Alkaline Phosphatase: 73 U/L (ref 45–117)
Anion Gap: 3 mmol/L — ABNORMAL LOW (ref 5–15)
BUN: 10 MG/DL (ref 6–20)
Bun/Cre Ratio: 16 (ref 12–20)
CO2: 28 mmol/L (ref 21–32)
Calcium: 8.4 MG/DL — ABNORMAL LOW (ref 8.5–10.1)
Chloride: 107 mmol/L (ref 97–108)
Creatinine: 0.63 MG/DL (ref 0.55–1.02)
EGFR IF NonAfrican American: >60 mL/min/{1.73_m2} (ref >60)
GFR African American: >60 mL/min/{1.73_m2} (ref >60)
Globulin: 3.6 g/dL (ref 2.0–4.0)
Glucose: 167 mg/dL — ABNORMAL HIGH (ref 65–100)
Potassium: 3.7 mmol/L (ref 3.5–5.1)
Sodium: 138 mmol/L (ref 136–145)
Total Bilirubin: 0.3 MG/DL (ref 0.2–1.0)
Total Protein: 6.4 g/dL (ref 6.4–8.2)

## 2021-07-10 MED ORDER — NOVOLIN 70/30 U-100 INSULIN 100 UNIT/ML SUBCUTANEOUS SUSPENSION
100 unit/mL (70-30) | Freq: Two times a day (BID) | SUBCUTANEOUS | 0 refills | Status: AC
Start: 2021-07-10 — End: ?

## 2021-07-10 MED ORDER — PANTOPRAZOLE 40 MG TAB, DELAYED RELEASE
40 mg | ORAL_TABLET | Freq: Every day | ORAL | 0 refills | 90.00 days | Status: DC
Start: 2021-07-10 — End: 2021-09-07

## 2021-07-10 MED ORDER — INSULIN LISPRO 100 UNIT/ML INJECTION
100 unit/mL | Freq: Four times a day (QID) | SUBCUTANEOUS | Status: AC
Start: 2021-07-10 — End: 2021-07-10
  Administered 2021-07-10 (×3): via SUBCUTANEOUS

## 2021-07-10 MED ORDER — MELATONIN 3 MG TAB
3 mg | Freq: Every evening | ORAL | Status: DC | PRN
Start: 2021-07-10 — End: 2021-07-10
  Administered 2021-07-10: 04:00:00 via ORAL

## 2021-07-10 MED ORDER — GLUCAGON 1 MG INJECTION
1 mg | INTRAMUSCULAR | Status: DC | PRN
Start: 2021-07-10 — End: 2021-07-10

## 2021-07-10 MED ORDER — DEXTROSE 10% IN WATER (D10W) IV
10 % | INTRAVENOUS | Status: AC | PRN
Start: 2021-07-10 — End: 2021-07-10

## 2021-07-10 MED ORDER — SERTRALINE 25 MG TAB
25 mg | ORAL_TABLET | Freq: Every day | ORAL | 0 refills | Status: AC
Start: 2021-07-10 — End: 2021-09-07

## 2021-07-10 MED ORDER — GLUCOSE 4 GRAM CHEWABLE TAB
4 gram | ORAL | Status: AC | PRN
Start: 2021-07-10 — End: 2021-07-10

## 2021-07-10 MED ORDER — ONDANSETRON 4 MG TAB, RAPID DISSOLVE
4 mg | ORAL_TABLET | Freq: Three times a day (TID) | ORAL | 0 refills | Status: AC | PRN
Start: 2021-07-10 — End: 2021-07-17

## 2021-07-10 MED ORDER — CEFDINIR 300 MG CAP
300 mg | ORAL_CAPSULE | Freq: Two times a day (BID) | ORAL | 0 refills | Status: AC
Start: 2021-07-10 — End: 2021-07-13

## 2021-07-10 MED FILL — ENOXAPARIN 40 MG/0.4 ML SUB-Q SYRINGE: 40 mg/0.4 mL | SUBCUTANEOUS | Qty: 0.4

## 2021-07-10 MED FILL — PROTONIX 40 MG INTRAVENOUS SOLUTION: 40 mg | INTRAVENOUS | Qty: 40

## 2021-07-10 MED FILL — CEFTRIAXONE 1 GRAM SOLUTION FOR INJECTION: 1 gram | INTRAMUSCULAR | Qty: 1

## 2021-07-10 MED FILL — SUCRALFATE 1 GRAM TAB: 1 gram | ORAL | Qty: 1

## 2021-07-10 MED FILL — SERTRALINE 25 MG TAB: 25 mg | ORAL | Qty: 1

## 2021-07-10 MED FILL — INSULIN LISPRO 100 UNIT/ML INJECTION: 100 unit/mL | SUBCUTANEOUS | Qty: 1

## 2021-07-10 MED FILL — MELATONIN 3 MG TAB: 3 mg | ORAL | Qty: 1
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
## 2021-07-10 NOTE — Progress Notes (Signed)
0730: Bedside and Verbal shift change report given to Jasmin,RN (oncoming nurse) by Sarah.O,RN (offgoing nurse). Report included the following information SBAR, Kardex, MAR, Accordion, and Recent Results.

## 2021-07-10 NOTE — Progress Notes (Signed)
Hospital follow up message was left for the  [facility] to call patient back to get appointment scheduled.

## 2021-07-10 NOTE — Progress Notes (Signed)
07/10/2021  Case Management Progress Note    8:59 AM  Patient is 40 year old female admitted 9/28 with nausea and vomiting  Patient does not have an RUR due to observation status  Covid test: negative 9/30  Chart reviewed  [facility] bed board again this morning--they are reviewing for possible placement today. Covid test is negative. Will continue to follow and update.     Transition of Care Plan   Continue medical management/treatment  Working on inpatient psych placement per psych note yesterday   EMTALA will be needed if patient transferring   CM will continue to follow    Clelia Schaumann, MSW

## 2021-07-10 NOTE — Progress Notes (Signed)
Progress  Notes by Mariea Stable, NP at 07/10/21 1343                Author: Mariea Stable, NP  Service: Gastroenterology  Author Type: Nurse Practitioner       Filed: 07/10/21 1346  Date of Service: 07/10/21 1343  Status: Signed          Editor: Mariea Stable, NP (Nurse Practitioner)               [name] - [facility]   [place] NP   [phone]               GI PROGRESS NOTE            NAME: Melinda [name]    DOB:  [birth year]    MRN:  [id number]            Subjective:     No complaints this afternoon.  Nausea and vomiting are improved.  No abdominal pain.            Objective:     NAD         VITALS:    Last 24hrs VS reviewed since prior progress note. Most recent are:   Visit Vitals      BP  (!) 148/99 (BP 1 Location: Left upper arm, BP Patient Position: Sitting)     Pulse  96     Temp  97.5 ??F (36.4 ??C)     Resp  18     Ht  4\' 11"  (1.499 m)     Wt  56.7 kg (125 lb)     SpO2  100%        BMI  25.25 kg/m??           Intake/Output Summary (Last 24 hours) at 07/10/2021 1344   Last data filed at 07/10/2021 1338     Gross per 24 hour        Intake  2586 ml        Output  --        Net  2586 ml           PHYSICAL EXAM:   General: Alert, in no acute distress     HEENT: Anicteric sclerae.   Lungs:            CTA Bilaterally.    Heart:  Regular  rhythm,     Abdomen: Soft, Non distended, Non tender.  (+)Bowel sounds, no HSM   Extremities: No c/c/e   Neurologic:  CN 2-12 gi, Alert and oriented X 3.  No acute neurological distress    Psych:   Good insight. Not anxious nor agitated.      Lab Data Reviewed:      Recent Labs            07/10/21   0001  07/08/21   1747     WBC  8.5  9.3     HGB  10.7*  12.5     HCT  32.2*  36.7         PLT  390  428*          Recent Labs            07/10/21   0001  07/09/21   0242     NA  138  139     K  3.7  3.2*     CL  107  104     CO2  28  28     BUN  10  18     CREA  0.63  0.76     GLU  167*  222*         CA  8.4*  8.5          Recent Labs            07/10/21   0001   07/08/21   1747     AP  73  105     TP  6.4  8.0     ALB  2.8*  3.2*     GLOB  3.6  4.8*         LPSE   --   178           ________________________________________________________________________     Patient Active Problem List        Diagnosis  Code         ?  Nausea and vomiting  R11.2              Assessment and Plan:   Epigastric Pain, Vomiting:  Pt with history of poorly controlled diabetes, admitted with UTI.  CT shows Gas within the bladder and  is otherwise normal. Concerning for gastroparesis, due to uti / sepsis, esophagitis, less likely gastric outlet outlet obstruction.   Symptoms are improved.        - GI Lite diet   - Continue supportive care with Zofran   - Educated patient on gastroparesis diet modifications - small, frequent meals low in fat and residue   - Continue acid suppression.    - No plans for EGD at this time.    - Diabetes CNS has adjusted her insulin regimen         Ok to discharge from GI standpoint.  Follow up in the office as outpatient.                     Signed By:  Mariea Stable, NP           07/10/2021  1:44 PM

## 2021-07-10 NOTE — Progress Notes (Signed)
Communication Note    Patient speaks Spanish as their preferred language for their healthcare communication, please reach out to Language Services for interpreter services at:     Maria Claudia Oystese, Senior Interpreter - Navigator - [phone]    Email: [email or web address]  General phone: [phone] ([phone])    Our interpreters are available for team members working with limited English proficient (LEP) patients remotely, in person as well as phone or video interpreters on the [employer] carts.    For the LATEST Language services updates/ resources please see our Language Services page on:[facility] under the Clinical Resources tab.    Please always document the use of interpreter services (name and/or number of interpreter) in your clinical notes.

## 2021-07-11 LAB — CULTURE, URINE
Colonies Counted: >100000
Colony Count: >100000

## 2021-07-17 NOTE — Progress Notes (Signed)
Progress Notes signed by Horald Chestnut, DO at 07/17/21 1736                 Author: Horald Chestnut, DO  Service: Internal Medicine  Author Type: Physician       Filed: 07/17/21 1736  Date of Service: 07/10/21 1555  Status: Signed          Editor: Horald Chestnut, DO (Physician)                  Physician Progress Note         PATIENT:               Melinda [name], Melinda [name]   CSN #:                  [id number]   DOB:                       1981/02/17   ADMIT DATE:       07/08/2021 5:57 PM   DISCH DATE:        07/10/2021 3:55 PM   RESPONDING   PROVIDER #:        Arna Medici DO Jameon Deller DO               QUERY TEXT:      Good morning   Patient admitted with acute cystitis.   Noted documentation of sepsis in consult note on 09/29.      In order to support the diagnosis of sepsis, please include additional clinical indicators in your documentation.  Or please document if the diagnosis of sepsis has been ruled out after further study.      The medical record reflects the following:   Risk Factors: cystitis/UTI, DM2, gastroparesis   Clinical Indicators: progress note "Concerning for gastroparesis, due to uti / sepsis, esophagitis, less likely gastric outlet outlet obstruction."   UC- ESCHERICHIA COLI   Treatment: IV Rocephin, IVF bolus, UC      Thank you   Katina Degree RN CDI   [phone]   Options provided:   -- Sepsis present as evidenced by, Please document evidence.   -- Sepsis was ruled out after study   -- Other - I will add my own diagnosis   -- Disagree - Not applicable / Not valid   -- Disagree - Clinically unable to determine / Unknown   -- Refer to Clinical Documentation Reviewer      PROVIDER RESPONSE TEXT:      Sepsis was ruled out after study.      Query created by: Katina Degree on 07/13/2021 10:21 AM         QUERY TEXT:      Good morning   Pt admitted with acute cystitis.   Pt noted to have gastroparesis.      If possible, please document in progress notes and discharge summary the relationship, if any,  between abdominal pain, gastroparesis and UTI.      The medical record reflects the following:   Risk Factors: UTI, peptic ulcer, gastroparesis   Clinical Indicators: Patient presents to ED c/o epigastric pain which started 3 days prior. Reports pain has been worsening. Patient also developed nausea, vomiting, chills and headache. H&P noted as Nausea   and vomiting-Likely secondary to the abdominal pain. Abdominal Pain is sharp in nature.   Epigastric abdominal pain-Check lipase level-possible gastric ulcer will start on Protonix IV 40 mg   b.I.d. Consult notes noted as Epigastric Pain,  Vomiting: Pt with history of poorly controlled diabetes,   admitted with UTI-Concerning for gastroparesis, due to uti / sepsis, esophagitis, less likely gastric   outlet outlet obstruction. Discharge summary noted as Acute abdominal pain/intractable nausea   vomiting POA: Unclear etiology. likely due to Gastroparesis. Less likely Peptic ulcer disease. CT   abdomen pelvis showing no acute concerns other than gas within the bladder which could be   attributed to UTI   Treatment: UA/UC, GI consult      Thank you   Katina Degree RN CDI   [phone]   Options provided:   -- abdominal pain due to gastroparesis   -- abdominal pain unrelated to gastroparesis   -- abdominal pain due to UTI   -- abdominal pain due to peptic ulcer   -- abdominal pain not significant   -- Other - I will add my own diagnosis   -- Disagree - Not applicable / Not valid   -- Disagree - Clinically unable to determine / Unknown   -- Refer to Clinical Documentation Reviewer      PROVIDER RESPONSE TEXT:      Provider is clinically unable to determine a response to this query.      Query created by: Katina Degree on 07/13/2021 10:36 AM         Electronically signed by:  Arna Medici DO Trinka Keshishyan DO 07/17/2021 5:34 PM

## 2021-07-21 ENCOUNTER — Ambulatory Visit: Admit: 2021-07-21 | Discharge: 2021-07-21 | Payer: Charity

## 2021-07-21 ENCOUNTER — Ambulatory Visit

## 2021-07-21 DIAGNOSIS — Z0000 Encounter for general adult medical examination without abnormal findings: Secondary | ICD-10-CM

## 2021-07-21 LAB — ECHO ADULT COMPLETE
AV Area by Peak Velocity: 2 cm2
AV Area by VTI: 2.3 cm2
AV Mean Gradient: 3 mmHg
AV Mean Velocity: 0.8 m/s
AV Peak Gradient: 5 mmHg
AV Peak Velocity: 1.1 m/s
AV VTI: 18 cm
AV Velocity Ratio: 0.73
AVA/BSA Peak Velocity: 1.3 cm2/m2
AVA/BSA VTI: 1.5 cm2/m2
Ao Root Index: 1.92 cm/m2
Aortic Root: 2.9 cm
Ascending Aorta Index: 1.66 cm/m2
Ascending Aorta: 2.5 cm
E/E' Lateral: 8.5
E/E' Ratio (Averaged): 9.11
E/E' Septal: 9.71
Fractional Shortening 2D: 33 % (ref 28–44)
IVSd: 1.3 cm — AB (ref 0.6–0.9)
LA Diameter: 3.6 cm
LA Size Index: 2.38 cm/m2
LA Volume 2C: 26 mL (ref 22–52)
LA Volume 4C: 31 mL (ref 22–52)
LA Volume A/L: 32 mL
LA Volume BP: 29 mL (ref 22–52)
LA Volume Index 2C: 17 mL/m2 (ref 16–34)
LA Volume Index 4C: 21 mL/m2 (ref 16–34)
LA Volume Index A/L: 21 mL/m2 (ref 16–34)
LA Volume Index BP: 19 ml/m2 (ref 16–34)
LA/AO Root Ratio: 1.24
LV E' Lateral Velocity: 8 cm/s
LV E' Septal Velocity: 7 cm/s
LV Mass 2D Index: 106 g/m2 — AB (ref 43–95)
LV Mass 2D: 160.1 g (ref 67–162)
LV RWT Ratio: 0.72
LVIDd Index: 2.38 cm/m2
LVIDd: 3.6 cm — AB (ref 3.9–5.3)
LVIDs Index: 1.59 cm/m2
LVIDs: 2.4 cm
LVOT Area: 2.5 cm2
LVOT Diameter: 1.8 cm
LVOT Mean Gradient: 2 mmHg
LVOT Peak Gradient: 3 mmHg
LVOT Peak Velocity: 0.8 m/s
LVOT SV: 39.7 ml
LVOT Stroke Volume Index: 26.3 mL/m2
LVOT VTI: 15.6 cm
LVOT:AV VTI Index: 0.87
LVPWd: 1.3 cm — AB (ref 0.6–0.9)
MV A Velocity: 0.94 m/s
MV Area by PHT: 3.5 cm2
MV E Velocity: 0.68 m/s
MV E Wave Deceleration Time: 217.8 ms
MV E/A: 0.72
MV PHT: 63.2 ms
RV Free Wall Peak S': 12 cm/s
TAPSE: 1.6 cm — AB (ref 1.7–?)

## 2021-07-21 LAB — TRANSTHORACIC ECHOCARDIOGRAM (TTE) COMPLETE (CONTRAST/BUBBLE/3D PRN)
AV Area by Peak Velocity: 2 cm2
AV Area by VTI: 2.3 cm2
AV Mean Gradient: 3 mmHg
AV Mean Velocity: 0.8 m/s
AV Peak Gradient: 5 mmHg
AV Peak Velocity: 1.1 m/s
AV VTI: 18 cm
AV Velocity Ratio: 0.73
AVA/BSA Peak Velocity: 1.3 cm2/m2
AVA/BSA VTI: 1.5 cm2/m2
Ao Root Index: 1.92 cm/m2
Aortic Root: 2.9 cm
Ascending Aorta Index: 1.66 cm/m2
Ascending Aorta: 2.5 cm
E/E' Lateral: 8.5
E/E' Ratio (Averaged): 9.11
E/E' Septal: 9.71
Fractional Shortening 2D: 33 % (ref 28–44)
IVSd: 1.3 cm — AB (ref 0.6–0.9)
LA Diameter: 3.6 cm
LA Size Index: 2.38 cm/m2
LA Volume 2C: 26 mL (ref 22–52)
LA Volume 4C: 31 mL (ref 22–52)
LA Volume A/L: 32 mL
LA Volume BP: 29 mL (ref 22–52)
LA Volume Index 2C: 17 mL/m2 (ref 16–34)
LA Volume Index 4C: 21 mL/m2 (ref 16–34)
LA Volume Index A/L: 21 mL/m2 (ref 16–34)
LA Volume Index BP: 19 ml/m2 (ref 16–34)
LA/AO Root Ratio: 1.24
LV E' Lateral Velocity: 8 cm/s
LV E' Septal Velocity: 7 cm/s
LV Mass 2D Index: 106 g/m2 — AB (ref 43–95)
LV Mass 2D: 160.1 g (ref 67–162)
LV RWT Ratio: 0.72
LVIDd Index: 2.38 cm/m2
LVIDd: 3.6 cm — AB (ref 3.9–5.3)
LVIDs Index: 1.59 cm/m2
LVIDs: 2.4 cm
LVOT Area: 2.5 cm2
LVOT Diameter: 1.8 cm
LVOT Mean Gradient: 2 mmHg
LVOT Peak Gradient: 3 mmHg
LVOT Peak Velocity: 0.8 m/s
LVOT SV: 39.7 ml
LVOT Stroke Volume Index: 26.3 mL/m2
LVOT VTI: 15.6 cm
LVOT:AV VTI Index: 0.87
LVPWd: 1.3 cm — AB (ref 0.6–0.9)
Left Ventricular Ejection Fraction: 63
MV A Velocity: 0.94 m/s
MV Area by PHT: 3.5 cm2
MV E Velocity: 0.68 m/s
MV E Wave Deceleration Time: 217.8 ms
MV E/A: 0.72
MV PHT: 63.2 ms
RV Free Wall Peak S': 12 cm/s
TAPSE: 1.6 cm — AB

## 2021-07-21 NOTE — Progress Notes (Signed)
Mailed to pt

## 2021-07-21 NOTE — Progress Notes (Signed)
Your echocardiogram reveals normal heart function and this is great news.     All the best,    Melinda [name]

## 2021-07-27 ENCOUNTER — Ambulatory Visit: Admit: 2021-07-27 | Discharge: 2021-07-27 | Payer: Charity

## 2021-07-27 ENCOUNTER — Ambulatory Visit: Admit: 2021-07-27 | Discharge: 2021-07-27

## 2021-07-27 ENCOUNTER — Encounter

## 2021-07-27 ENCOUNTER — Ambulatory Visit

## 2021-07-27 DIAGNOSIS — Z0000 Encounter for general adult medical examination without abnormal findings: Secondary | ICD-10-CM

## 2021-07-27 LAB — ECHO STRESS
Angina Index: 0
Baseline Diastolic BP: 86 mmHg
Baseline HR: 100 {beats}/min
Baseline O2 Sat: 100 %
Baseline ST Depression: 0 mm
Baseline Systolic BP: 160 mmHg
Duke Treadmill Score: 6
Exercise Duration Time: 5 min
Exercuse Duration Seconds: 43 s
Stress Diastolic BP: 90 mmHg
Stress Estimated Workload: 9.9 METS
Stress O2 Sat: 100 %
Stress Peak HR: 141 {beats}/min
Stress Percent HR Achieved: 78 %
Stress Rate Pressure Product: 25380 BPM*mmHg
Stress ST Depression: 0 mm
Stress Systolic BP: 180 mmHg
Stress Target HR: 181 {beats}/min

## 2021-07-27 LAB — STRESS ECHOCARDIOGRAM EXERCISE (CONTRAST/BUBBLE PRN)
Angina Index: 0
Baseline Diastolic BP: 86 mmHg
Baseline HR: 100 {beats}/min
Baseline O2 Sat: 100 %
Baseline ST Depression: 0 mm
Baseline Systolic BP: 160 mmHg
Duke Treadmill Score: 6
Exercise Duration Time: 5 min
Exercuse Duration Seconds: 43 s
Left Ventricular Ejection Fraction: 63
Stress Diastolic BP: 90 mmHg
Stress Estimated Workload: 9.9 METS
Stress O2 Sat: 100 %
Stress Peak HR: 141 {beats}/min
Stress Percent HR Achieved: 78 %
Stress Rate Pressure Product: 25380 BPM*mmHg
Stress ST Depression: 0 mm
Stress Systolic BP: 180 mmHg
Stress Target HR: 181 {beats}/min

## 2021-07-27 NOTE — Progress Notes (Signed)
Your cholesterol numbers are not at goal. To provide you with the best heart health the LDL should be under 100 if you have no heart disease or history of diabetes. If you have a history of diabetes or heart disease the LDL goal should be less than 70.    I think we should go on Zocor which is also known as simvastatin 20 mg a day and recheck her cholesterol in 2 months.    Take care and all the best    [facility]

## 2021-07-27 NOTE — Progress Notes (Signed)
Mailed to pt

## 2021-07-28 LAB — LIPID PANEL
CHOL/HDL Ratio: 4.2 (ref 0.0–5.0)
Chol/HDL Ratio: 4.2 (ref 0.0–5.0)
Cholesterol, Total: 254 MG/DL — ABNORMAL HIGH (ref <200)
Cholesterol, total: 254 MG/DL — ABNORMAL HIGH (ref <200)
HDL Cholesterol: 61 MG/DL
HDL: 61 MG/DL
LDL Calculated: 147.8 MG/DL — ABNORMAL HIGH (ref 0–100)
LDL, calculated: 147.8 MG/DL — ABNORMAL HIGH (ref 0–100)
Triglyceride: 226 MG/DL — ABNORMAL HIGH (ref <150)
Triglycerides: 226 MG/DL — ABNORMAL HIGH (ref <150)
VLDL Cholesterol Calculated: 45.2 MG/DL
VLDL, calculated: 45.2 MG/DL

## 2021-07-28 LAB — HEPATIC FUNCTION PANEL
A-G Ratio: 0.9 — ABNORMAL LOW (ref 1.1–2.2)
ALT (SGPT): 24 U/L (ref 12–78)
ALT: 24 U/L (ref 12–78)
AST (SGOT): 15 U/L (ref 15–37)
AST: 15 U/L (ref 15–37)
Albumin/Globulin Ratio: 0.9 — ABNORMAL LOW (ref 1.1–2.2)
Albumin: 3.2 g/dL — ABNORMAL LOW (ref 3.5–5.0)
Albumin: 3.2 g/dL — ABNORMAL LOW (ref 3.5–5.0)
Alk. phosphatase: 102 U/L (ref 45–117)
Alkaline Phosphatase: 102 U/L (ref 45–117)
Bilirubin, Direct: <0.1 MG/DL (ref 0.0–0.2)
Bilirubin, direct: <0.1 MG/DL (ref 0.0–0.2)
Bilirubin, total: 0.2 MG/DL (ref 0.2–1.0)
Globulin: 3.7 g/dL (ref 2.0–4.0)
Globulin: 3.7 g/dL (ref 2.0–4.0)
Protein, total: 6.9 g/dL (ref 6.4–8.2)
Total Bilirubin: 0.2 MG/DL (ref 0.2–1.0)
Total Protein: 6.9 g/dL (ref 6.4–8.2)

## 2021-07-31 ENCOUNTER — Encounter

## 2021-09-07 ENCOUNTER — Ambulatory Visit: Admit: 2021-09-07 | Discharge: 2021-09-07 | Payer: Charity | Attending: Specialist

## 2021-09-07 DIAGNOSIS — R0602 Shortness of breath: Secondary | ICD-10-CM

## 2021-09-07 MED ORDER — SIMVASTATIN 20 MG TAB
20 mg | ORAL_TABLET | Freq: Every evening | ORAL | 5 refills | Status: AC
Start: 2021-09-07 — End: ?

## 2021-09-07 MED ORDER — METOPROLOL SUCCINATE SR 25 MG 24 HR TAB
25 mg | ORAL_TABLET | Freq: Every day | ORAL | 5 refills | 90.00 days | Status: AC
Start: 2021-09-07 — End: ?

## 2021-09-07 NOTE — Progress Notes (Signed)
 Visit Vitals  BP 120/70   Pulse 100   Ht 4' 11 (1.499 m)   Wt 143 lb (64.9 kg)   SpO2 98%   BMI 28.88 kg/m

## 2021-09-07 NOTE — Progress Notes (Signed)
Progress Notes by Vennie Homans, MD at 09/07/21 1300                Author: Vennie Homans, MD  Service: --  Author Type: Physician       Filed: 09/07/21 1352  Encounter Date: 09/07/2021  Status: Signed          Editor: Vennie Homans, MD (Physician)                                       CARDIOLOGY OFFICE NOTE      Melinda How. Loney Domingo, MD, [address], [place] [address]   Phone [phone]; Fax [phone]   Mobile [phone]   Voice Mail [phone]      Primary care: None          ATTENTION:    This medical record was transcribed using an electronic medical records/speech recognition system.  Although proofread, it may and can contain electronic, spelling and other errors.  Corrections may be executed at a later time.  Please feel free to  contact us for any clarifications as needed.                   Melinda [name] is a 40 y.o. female with  referred for tachycardia            Cardiac risk factors: dyslipidemia, FMHx ,diabetes mellitus x 15 yrs, sedentary life style, hypertension   I have personally obtained the history from the patient.        HISTORY OF PRESENTING ILLNESS         Spoke to patient with spanish interpretor [phone]   [name]  40 y.o. is seeing me for tachycardia.  She remains tachycardic in the office today.  She has not been taking her metoprolol because there was no refills on her prescription.  Cardiac work-up thus far was negative.  Her  triglycerides came down on fenofibrate but her LDL remains very high so we talked about going on a statin and she is in agreement to going on Zocor 20 mg a day            ACTIVE PROBLEM LIST          Patient Active Problem List           Diagnosis  Date Noted         ?  Nausea and vomiting  07/08/2021                  PAST MEDICAL HISTORY          Past Medical History:        Diagnosis  Date         ?  High blood pressure       ?  High cholesterol       ?  Type 2 diabetes mellitus ([place])             Controled                  PAST SURGICAL HISTORY          Past Surgical History:         Procedure  Laterality  Date          ?  HX OTHER SURGICAL  Left  Rentia attachment                 ALLERGIES        No Known Allergies            FAMILY HISTORY        History reviewed. No pertinent family history. negative for cardiac disease            SOCIAL HISTORY          Social History          Socioeconomic History         ?  Marital status:  MARRIED       Tobacco Use         ?  Smoking status:  Never              Passive exposure:  Never         ?  Smokeless tobacco:  Never       Substance and Sexual Activity         ?  Alcohol use:  Never         ?  Drug use:  Never                MEDICATIONS          Current Outpatient Medications        Medication  Sig         ?  lisinopriL (PRINIVIL, ZESTRIL) 10 mg tablet  Take  by mouth daily.     ?  amLODIPine (NORVASC) 5 mg tablet  Take 5 mg by mouth daily.     ?  NovoLIN 70/30 U-100 Insulin 100 unit/mL (70-30) injection  15 Units by SubCUTAneous route two (2) times a day. 17/30     ?  hydroCHLOROthiazide (HYDRODIURIL) 25 mg tablet  Take 25 mg by mouth daily.     ?  Fenofibrate Nanocrystallized 160 mg tab  Take 160 mg by mouth daily.         ?  ferrous sulfate 325 mg (65 mg iron) tablet  Take  by mouth Daily (before breakfast).          No current facility-administered medications for this visit.           I have reviewed the nurses notes, vitals, problem list, allergy list, medical history, family, social history and medications.            REVIEW OF SYMPTOMS      As per HPI   General: Pt denies excessive weight gain or loss. Pt is able to conduct ADL's   HEENT: Denies blurred vision, headaches, hearing loss, epistaxis and difficulty swallowing.   Respiratory: Denies cough, congestion, shortness of breath, DOE, wheezing or stridor.   Cardiovascular: Denies precordial pain, palpitations, edema or PND   Gastrointestinal: Denies poor appetite, indigestion,  abdominal pain or blood in stool   Genitourinary: Denies hematuria, dysuria, increased urinary frequency   Musculoskeletal: Denies joint pain or swelling from muscles or joints   Neurologic: Denies tremor, paresthesias, headache, or sensory motor disturbance   Psychiatric: Denies confusion, insomnia, depression   Integumentray: Denies rash, itching or ulcers.   Hematologic: Denies easy bruising, bleeding         PHYSICAL EXAMINATION           Vitals:          09/07/21 1305        BP:  120/70     Pulse:  100  SpO2:  98%     Weight:  143 lb (64.9 kg)        Height:  '4\' 11"'$  (1.499 m)           General: Well developed, in no acute distress.  Spoke through an interpreter   HEENT: No jaundice, oral mucosa moist, no oral ulcers   Neck: Supple, no stiffness, no lymphadenopathy, supple   Heart: Fast rate regular rhythm   Respiratory: Clear bilaterally x 4, no wheezing or rales   Extremities:  No edema, normal cap refill, no cyanosis.   Musculoskeletal: No clubbing, no deformities   Neuro: A&Ox3, speech clear, gait stable, cooperative, no focal neurologic deficits   Skin: Skin color is normal. No rashes or lesions. Non diaphoretic, moist.             EKG: Date: (06/22/2021)-sinus tachycardia left anterior fascicular block         DIAGNOSTIC DATA        1. Lipids   01/08/21- TC 283, HDL 42, TG 650   07/27/21- TC 254, HDL 61, LDL 147.8, TG 226      2. Echo   07/21/21- EF 60 - 65%, consistent with mild concentric hypertrophy      3. Stress Test   07/27/21- normal, 9.9 mets, 5:43 min      4. Loop   07/07/21-07/20/21 - SR/ST average HR 96, max 141, min 76               LABORATORY DATA             Lab Results         Component  Value  Date/Time            WBC  8.5  07/10/2021 12:01 AM       HGB  10.7 (L)  07/10/2021 12:01 AM       HCT  32.2 (L)  07/10/2021 12:01 AM       PLATELET  390  07/10/2021 12:01 AM            MCV  85.9  07/10/2021 12:01 AM           Lab Results         Component  Value  Date/Time            Sodium  138   07/10/2021 12:01 AM       Potassium  3.7  07/10/2021 12:01 AM       Chloride  107  07/10/2021 12:01 AM       CO2  28  07/10/2021 12:01 AM       Anion gap  3 (L)  07/10/2021 12:01 AM       Glucose  167 (H)  07/10/2021 12:01 AM       BUN  10  07/10/2021 12:01 AM       Creatinine  0.63  07/10/2021 12:01 AM       BUN/Creatinine ratio  16  07/10/2021 12:01 AM       GFR est AA  >60  07/10/2021 12:01 AM       GFR est non-AA  >60  07/10/2021 12:01 AM       Calcium  8.4 (L)  07/10/2021 12:01 AM       Bilirubin, total  0.2  07/27/2021 11:35 AM       Alk. phosphatase  102  07/27/2021 11:35 AM       Protein, total  6.9  07/27/2021  11:35 AM       Albumin  3.2 (L)  07/27/2021 11:35 AM       Globulin  3.7  07/27/2021 11:35 AM       A-G Ratio  0.9 (L)  07/27/2021 11:35 AM            ALT (SGPT)  24  07/27/2021 11:35 AM                        ICD-10-CM  ICD-9-CM          1.  SOB (shortness of breath)   R06.02  786.05          2.  Tachycardia   R00.0  785.0              ASSESSMENT/RECOMMENDATIONS:.         1.  Tachycardia   -She does appear to be tachycardic on her heart monitor but not of any significant drop to 96 at times.  At the minimum we will go even down to 76.   -We should restart her metoprolol XL at 25 mg at night and she will stop the amlodipine for now she is complaining of low blood pressures   2. Hypertension   -I rechecked her blood pressure was 140/90   -Continue on her hydrochlorothiazide for the time being and hold amlodipine   -I have placed on metoprolol XL 25 mg take at night   -We will recheck her blood pressure 6 to 8 weeks sitting and standing   3. Dyslipidemia   -LDL goal is less than 70 in this diabetic   -Continue fenofibrate as it is working lowering her triglycerides and place her on Zocor 20 mg a day   -recheck FLP in 2 mo.    4. DM     -HgbA1c 11.3    -Needs work more aggressively lowering her hemoglobin A1c with a low carbohydrate diet      6 to 8 weeks follow-up        Orders Placed This Encounter         ?  lisinopriL (PRINIVIL, ZESTRIL) 10 mg tablet             Sig: Take  by mouth daily.        ?  amLODIPine (NORVASC) 5 mg tablet             Sig: Take 5 mg by mouth daily.              We discussed the expected course, resolution and complications of the diagnosis(es) in detail.  Medication risks, benefits, costs, interactions, and alternatives were discussed as indicated.  I advised him to contact the office if his condition worsens,  changes or fails to improve as anticipated. He expressed understanding with the diagnosis(es) and plan                Follow-up and Dispositions    ??       Return in about 6 months (around 03/07/2022).                    I have discussed the diagnosis with  Melinda [name] and the intended plan as  seen in the above orders.  Questions were answered concerning future plans.  I have discussed medication side effects and warnings with the patient as well.      Thank you,  None for involving me in the care  of  Melinda [name]. Please do not hesitate to contact me for further questions/concerns.             Shanera Meske A.Orvil Faraone,  MD, [facility]       [address] [place], [place]      [place], [place]       [phone] / [phone] Fax

## 2021-10-24 ENCOUNTER — Inpatient Hospital Stay: Admit: 2021-10-24

## 2021-10-24 LAB — METABOLIC PANEL, COMPREHENSIVE
A-G Ratio: 0.8 — ABNORMAL LOW (ref 1.1–2.2)
ALT (SGPT): 24 U/L (ref 12–78)
AST (SGOT): 18 U/L (ref 15–37)
Albumin: 3.1 g/dL — ABNORMAL LOW (ref 3.5–5.0)
Alk. phosphatase: 98 U/L (ref 45–117)
Anion gap: 4 mmol/L — ABNORMAL LOW (ref 5–15)
BUN/Creatinine ratio: 33 — ABNORMAL HIGH (ref 12–20)
BUN: 26 MG/DL — ABNORMAL HIGH (ref 6–20)
Bilirubin, total: 0.2 MG/DL (ref 0.2–1.0)
CO2: 29 mmol/L (ref 21–32)
Calcium: 9.3 MG/DL (ref 8.5–10.1)
Chloride: 105 mmol/L (ref 97–108)
Creatinine: 0.8 MG/DL (ref 0.55–1.02)
Globulin: 4.1 g/dL — ABNORMAL HIGH (ref 2.0–4.0)
Glucose: 158 mg/dL — ABNORMAL HIGH (ref 65–100)
Potassium: 4.4 mmol/L (ref 3.5–5.1)
Protein, total: 7.2 g/dL (ref 6.4–8.2)
Sodium: 138 mmol/L (ref 136–145)
eGFR: >60 mL/min/{1.73_m2} (ref >60)

## 2021-10-24 LAB — HEMOGLOBIN A1C WITH EAG
Est. average glucose: 189 mg/dL
Hemoglobin A1c: 8.2 % — ABNORMAL HIGH (ref 4.0–5.6)

## 2021-10-24 LAB — COMPREHENSIVE METABOLIC PANEL
ALT: 24 U/L (ref 12–78)
AST: 18 U/L (ref 15–37)
Albumin/Globulin Ratio: 0.8 — ABNORMAL LOW (ref 1.1–2.2)
Albumin: 3.1 g/dL — ABNORMAL LOW (ref 3.5–5.0)
Alkaline Phosphatase: 98 U/L (ref 45–117)
Anion Gap: 4 mmol/L — ABNORMAL LOW (ref 5–15)
BUN: 26 MG/DL — ABNORMAL HIGH (ref 6–20)
Bun/Cre Ratio: 33 — ABNORMAL HIGH (ref 12–20)
CO2: 29 mmol/L (ref 21–32)
Calcium: 9.3 MG/DL (ref 8.5–10.1)
Chloride: 105 mmol/L (ref 97–108)
Creatinine: 0.8 MG/DL (ref 0.55–1.02)
ESTIMATED GLOMERULAR FILTRATION RATE: >60 mL/min/{1.73_m2} (ref >60)
Globulin: 4.1 g/dL — ABNORMAL HIGH (ref 2.0–4.0)
Glucose: 158 mg/dL — ABNORMAL HIGH (ref 65–100)
Potassium: 4.4 mmol/L (ref 3.5–5.1)
Sodium: 138 mmol/L (ref 136–145)
Total Bilirubin: 0.2 MG/DL (ref 0.2–1.0)
Total Protein: 7.2 g/dL (ref 6.4–8.2)

## 2021-10-24 LAB — HEMOGLOBIN A1C W/EAG
Hemoglobin A1C: 8.2 % — ABNORMAL HIGH (ref 4.0–5.6)
eAG: 189 mg/dL

## 2021-11-16 ENCOUNTER — Encounter: Attending: Specialist

## 2022-02-16 ENCOUNTER — Emergency Department: Admit: 2022-02-17

## 2022-02-16 ENCOUNTER — Inpatient Hospital Stay
Admit: 2022-02-16 | Discharge: 2022-02-17 | Disposition: A | Discharge status: Home / Self Care | Attending: Student in an Organized Health Care Education/Training Program

## 2022-02-16 DIAGNOSIS — R079 Chest pain, unspecified: Secondary | ICD-10-CM

## 2022-02-16 NOTE — ED Notes (Signed)
Pt to [place], RN  [phone]

## 2022-02-16 NOTE — ED Triage Notes (Signed)
Pt arrives via EMS.  PT states that for 2 days she has been having back pain along with off and on chest pain causing SOB whenever the pain occurs.  Pt states that this am she did have some N/V.

## 2022-02-16 NOTE — ED Provider Notes (Signed)
[facility] EMERGENCY DEPT  EMERGENCY DEPARTMENT ENCOUNTER      Pt Name: Melinda [name]  MRN: [id number]  Birthdate December 08, 1980  Date of evaluation: 02/16/2022  Provider: Raylene Everts, MD    CHIEF COMPLAINT       Chief Complaint   Patient presents with    Back Pain    Chest Pain         HISTORY OF PRESENT ILLNESS   41 year old female with history of HTN, DM, HLD presents to the ED with chief complaint of intermittent chest pain for the past 2 days.  Patient additionally reports intermittent dyspnea, low back pain, abdominal pain, and fatigue.  No fevers, chills, cough, urinary symptoms, bowel symptoms.  Has had some mild lower extremity edema.  Abdominal pain is in the epigastric region, not present currently.  Only pain present at this time is lower back.  Has been using Tylenol at home without much relief.  Denies any recent injuries.    The history is provided by the patient.     Review of External Medical Records:     Nursing Notes were reviewed.    REVIEW OF SYSTEMS       Review of Systems   Respiratory:  Positive for shortness of breath.    Cardiovascular:  Positive for chest pain.     Except as noted above the remainder of the review of systems was reviewed and negative.       PAST MEDICAL HISTORY     Past Medical History:   Diagnosis Date    High blood pressure     High cholesterol     Type 2 diabetes mellitus (HCC)     Controled         SURGICAL HISTORY       Past Surgical History:   Procedure Laterality Date    OTHER SURGICAL HISTORY Left     Rentia attachment         CURRENT MEDICATIONS       Discharge Medication List as of 02/16/2022 10:23 PM        CONTINUE these medications which have NOT CHANGED    Details   fenofibrate (TRIGLIDE) 160 MG tablet Take 1 tablet by mouth dailyHistorical Med      ferrous sulfate (IRON 325) 325 (65 Fe) MG tablet Take by mouth every morning (before breakfast)Historical Med      hydroCHLOROthiazide (HYDRODIURIL) 25 MG tablet Take 1 tablet by mouth dailyHistorical Med       insulin 70-30 (NOVOLIN 70/30) (70-30) 100 UNIT per ML injection vial Inject 15 Units into the skin 2 times dailyHistorical Med      lisinopril (PRINIVIL;ZESTRIL) 10 MG tablet Take 2 tablets by mouth dailyHistorical Med      metoprolol succinate (TOPROL XL) 25 MG extended release tablet Take 1 tablet by mouth dailyHistorical Med      simvastatin (ZOCOR) 20 MG tablet Take 1 tablet by mouth nightlyHistorical Med             ALLERGIES     Patient has no known allergies.    FAMILY HISTORY     History reviewed. No pertinent family history.       SOCIAL HISTORY       Social History     Socioeconomic History    Marital status: Married     Spouse name: None    Number of children: None    Years of education: None    Highest education level: None  Tobacco Use    Smoking status: Never    Smokeless tobacco: Never   Substance and Sexual Activity    Alcohol use: Never    Drug use: Never       SCREENINGS         Glasgow Coma Scale  Eye Opening: Spontaneous  Best Verbal Response: Oriented  Best Motor Response: Obeys commands  Glasgow Coma Scale Score: 15                     CIWA Assessment  BP: (!) 177/98  Pulse: (!) 103                 PHYSICAL EXAM       ED Triage Vitals [02/16/22 1958]   BP Temp Temp Source Pulse Respirations SpO2 Height Weight - Scale   (!) 195/98 98.8 F (37.1 C) Oral (!) 109 20 97 % -- 144 lb 15.9 oz (65.8 kg)       Body mass index is 29.29 kg/m.    Physical Exam  Constitutional:       Appearance: Normal appearance.   HENT:      Head: Normocephalic and atraumatic.      Right Ear: External ear normal.      Left Ear: External ear normal.      Nose: Nose normal.      Mouth/Throat:      Mouth: Mucous membranes are moist.   Eyes:      Extraocular Movements: Extraocular movements intact.      Conjunctiva/sclera: Conjunctivae normal.   Cardiovascular:      Rate and Rhythm: Regular rhythm. Tachycardia present.      Pulses: Normal pulses.   Pulmonary:      Effort: Pulmonary effort is normal.      Breath sounds:  Normal breath sounds.   Abdominal:      Palpations: Abdomen is soft.      Tenderness: There is no abdominal tenderness.   Musculoskeletal:         General: No tenderness. Normal range of motion.      Cervical back: Normal range of motion.      Right lower leg: Edema (trace) present.      Left lower leg: Edema (trace) present.      Comments: +bilateral paralumbar ttp   Skin:     General: Skin is warm and dry.   Neurological:      General: No focal deficit present.      Mental Status: She is alert and oriented to person, place, and time.   Psychiatric:         Mood and Affect: Mood normal.         Behavior: Behavior normal.       All other labs were within normal range or not returned as of this dictation.    EMERGENCY DEPARTMENT COURSE and DIFFERENTIAL DIAGNOSIS/MDM:   Vitals:    Vitals:    02/16/22 1958 02/16/22 2045 02/16/22 2230   BP: (!) 195/98 (!) 195/98 (!) 177/98   Pulse: (!) 109 (!) 103 (!) 103   Resp: 20 18 18    Temp: 98.8 F (37.1 C)  98.8 F (37.1 C)   TempSrc: Oral  Oral   SpO2: 97% 97% 99%   Weight: 65.8 kg (144 lb 15.9 oz)         Medical Decision Making  41 year old female presents to the ED with multiple complaints including chest pain, low back pain, lower extremity  edema.  Hypertensive in the 170s to 190s systolic, does report being compliant with her blood pressure medications.  Mildly tachycardic as well.  Exam relatively benign.  Differential includes ACS, musculoskeletal pain, electrolyte abnormality, PE, CHF.  D-dimer positive, CTA chest without any evidence of PE, pneumonia, or pulmonary edema.  Labs otherwise only significant for proBNP elevated to 400.  Possible element of undiagnosed heart failure but patient is oxygenating well without any pulmonary edema, do not feel inpatient management is warranted.  She was instructed to follow-up with her cardiologist as soon as possible, she has seen one in the past, and to see primary care.  Back pain is musculoskeletal on exam, instructed on  symptomatic treatment at home.  Offered prescriptions for back pain and patient declined.  She has had elevated blood pressure readings while in the ED and reports compliance with meds.  Will increase amlodipine dose from 5 mg to 10 mg and have her follow-up closely with primary care.  Return precautions given.    Amount and/or Complexity of Data Reviewed  Labs: ordered.  Radiology: ordered and independent interpretation performed.     Details: no PE seen on PE CT  ECG/medicine tests: ordered and independent interpretation performed. Decision-making details documented in ED Course.    Risk  Prescription drug management.            REASSESSMENT     ED Course as of 02/17/22 0238   Tue Feb 16, 2022   2016 ED EKG interpretation:8:16 PM  Rhythm: sinus tachycardia;  Rate (approx.): 105; Axis: LAD; QRS interval: normal ; ST/T wave: normal   [JW]      ED Course User Index  [JW] Marcell Anger, MD         CONSULTS:  None    PROCEDURES:  Unless otherwise noted below, none     Procedures    DISCHARGE NOTE:  2:43 AM  The patient has been re-evaluated and feeling much better and are stable for discharge.  All available radiology and laboratory results have been reviewed with patient and/or available family.  Patient and/or family verbally conveyed their understanding and agreement of the patient's signs, symptoms, diagnosis, treatment and prognosis and additionally agree to follow-up as recommended in the discharge instructions or to return to the Emergency Department should their condition change or worsen prior to their follow-up appointment.  All questions have been answered and patient and/or available family express understanding.      LABORATORY RESULTS:  Labs Reviewed   CBC WITH AUTO DIFFERENTIAL - Abnormal; Notable for the following components:       Result Value    RBC 3.70 (*)     Hemoglobin 9.8 (*)     Hematocrit 29.9 (*)     Platelets 448 (*)     Absolute Eos # 0.5 (*)     All other components within normal limits    COMPREHENSIVE METABOLIC PANEL - Abnormal; Notable for the following components:    Glucose 271 (*)     Bun/Cre Ratio 22 (*)     Calcium 8.2 (*)     Albumin 2.7 (*)     Globulin 4.4 (*)     Albumin/Globulin Ratio 0.6 (*)     All other components within normal limits   D-DIMER, QUANTITATIVE - Abnormal; Notable for the following components:    D-Dimer, Quant 0.92 (*)     All other components within normal limits   BRAIN NATRIURETIC PEPTIDE - Abnormal; Notable for the following  components:    NT Pro-BNP 442 (*)     All other components within normal limits   TROPONIN       All other labs were within normal range or not returned as of this dictation.    IMAGING RESULTS:  CTA CHEST W WO CONTRAST PE Eval   Final Result   No evidence of pulmonary embolism.   Clear lungs.         XR CHEST PORTABLE   Final Result   No acute intrathoracic process is identified.                   MEDICATIONS GIVEN:  Medications   ondansetron (ZOFRAN) injection 4 mg (4 mg IntraVENous Given 02/16/22 2017)   ketorolac (TORADOL) injection 15 mg (15 mg IntraVENous Given 02/16/22 2016)   0.9 % sodium chloride bolus (0 mLs IntraVENous Stopped 02/16/22 2233)   iopamidol (ISOVUE-370) 76 % injection 100 mL (100 mLs IntraVENous Given 02/16/22 2152)       IMPRESSION:  1. Chest pain, unspecified type        PLAN:  DISPOSITION Decision To Discharge 02/16/2022 10:22:00 PM      PATIENT REFERRED TO:  [facility]  [address]  [place] [id number]  [phone]  In 3 days      su cardilogo    In 1 week      [facility]  [address]  [place] [id number]  [phone]    As needed, If symptoms worsen      DISCHARGE MEDICATIONS:  Discharge Medication List as of 02/16/2022 10:23 PM          Signed By: Raylene EvertsJoseph A Jasmia Angst, MD     Feb 17, 2022        (Please note that portions of this note were completed with a voice recognition program.  Efforts were made to edit the dictations but  occasionally words are mis-transcribed.)         Marcell AngerJoseph Cletus Mehlhoff, MD  02/17/22 [phone]

## 2022-02-16 NOTE — ED Notes (Signed)
I have reviewed discharge instructions with the patient.  Opportunity for questions and clarification was provided.  The patient verbalized understanding.  Patient discharged out of the ED with no difficulty and in stable condition.        Kelton Pillar, RN  02/16/22 2231

## 2022-02-17 LAB — COMPREHENSIVE METABOLIC PANEL
ALT: UNDETERMINED U/L (ref 12–78)
AST: UNDETERMINED U/L (ref 15–37)
Albumin/Globulin Ratio: 0.6 — ABNORMAL LOW (ref 1.1–2.2)
Albumin: 2.7 g/dL — ABNORMAL LOW (ref 3.5–5.0)
Alk Phosphatase: 117 U/L (ref 45–117)
Anion Gap: 11 mmol/L (ref 5–15)
BUN: 20 MG/DL (ref 6–20)
Bun/Cre Ratio: 22 — ABNORMAL HIGH (ref 12–20)
CO2: 23 mmol/L (ref 21–32)
Calcium: 8.2 MG/DL — ABNORMAL LOW (ref 8.5–10.1)
Chloride: 103 mmol/L (ref 97–108)
Creatinine: 0.93 MG/DL (ref 0.55–1.02)
Est, Glom Filt Rate: >60 mL/min/{1.73_m2} (ref >60)
Globulin: 4.4 g/dL — ABNORMAL HIGH (ref 2.0–4.0)
Glucose: 271 mg/dL — ABNORMAL HIGH (ref 65–100)
Potassium: 3.8 mmol/L (ref 3.5–5.1)
Sodium: 137 mmol/L (ref 136–145)
Total Bilirubin: 0.3 MG/DL (ref 0.2–1.0)
Total Protein: 7.1 g/dL (ref 6.4–8.2)

## 2022-02-17 LAB — CBC WITH AUTO DIFFERENTIAL
Absolute Eos #: 0.5 10*3/uL — ABNORMAL HIGH (ref 0.0–0.4)
Absolute Immature Granulocyte: 0 10*3/uL (ref 0.00–0.04)
Absolute Lymph #: 2.8 10*3/uL (ref 0.8–3.5)
Absolute Mono #: 0.8 10*3/uL (ref 0.0–1.0)
Basophils Absolute: 0.1 10*3/uL (ref 0.0–0.1)
Basophils: 1 % (ref 0–1)
Eosinophils %: 5 % (ref 0–7)
Hematocrit: 29.9 % — ABNORMAL LOW (ref 35.0–47.0)
Hemoglobin: 9.8 g/dL — ABNORMAL LOW (ref 11.5–16.0)
Immature Granulocytes: 0 % (ref 0–0.5)
Lymphocytes: 28 % (ref 12–49)
MCH: 26.5 PG (ref 26.0–34.0)
MCHC: 32.8 g/dL (ref 30.0–36.5)
MCV: 80.8 FL (ref 80.0–99.0)
MPV: 9.7 FL (ref 8.9–12.9)
Monocytes: 8 % (ref 5–13)
Nucleated RBCs: 0 PER 100 WBC
Platelets: 448 10*3/uL — ABNORMAL HIGH (ref 150–400)
RBC: 3.7 M/uL — ABNORMAL LOW (ref 3.80–5.20)
RDW: 14 % (ref 11.5–14.5)
Seg Neutrophils: 58 % (ref 32–75)
Segs Absolute: 5.6 10*3/uL (ref 1.8–8.0)
WBC: 9.8 10*3/uL (ref 3.6–11.0)
nRBC: 0 10*3/uL (ref 0.00–0.01)

## 2022-02-17 LAB — D-DIMER, QUANTITATIVE: D-Dimer, Quant: 0.92 mg/L FEU — ABNORMAL HIGH (ref 0.00–0.65)

## 2022-02-17 LAB — TROPONIN: Troponin, High Sensitivity: 11 ng/L (ref 0–51)

## 2022-02-17 LAB — BRAIN NATRIURETIC PEPTIDE: NT Pro-BNP: 442 PG/ML — ABNORMAL HIGH (ref 0–125)

## 2022-02-17 MED ORDER — IOPAMIDOL 76 % IV SOLN
76 % | Freq: Once | INTRAVENOUS | Status: AC | PRN
Start: 2022-02-17 — End: 2022-02-16
  Administered 2022-02-17: 02:00:00 100 mL via INTRAVENOUS

## 2022-02-17 MED ORDER — KETOROLAC TROMETHAMINE 30 MG/ML IJ SOLN
30 MG/ML | Freq: Once | INTRAMUSCULAR | Status: AC
Start: 2022-02-17 — End: 2022-02-16
  Administered 2022-02-17: 15 mg via INTRAVENOUS

## 2022-02-17 MED ORDER — AMLODIPINE BESYLATE 5 MG PO TABS
5 MG | ORAL_TABLET | Freq: Every day | ORAL | 0 refills | Status: AC
Start: 2022-02-17 — End: 2023-01-14

## 2022-02-17 MED ORDER — ONDANSETRON HCL 4 MG/2ML IJ SOLN
4 MG/2ML | Freq: Once | INTRAMUSCULAR | Status: AC
Start: 2022-02-17 — End: 2022-02-16
  Administered 2022-02-17: 4 mg via INTRAVENOUS

## 2022-02-17 MED ORDER — LIDOCAINE 4 % EX PTCH
4 % | CUTANEOUS | Status: DC
Start: 2022-02-17 — End: 2022-02-17
  Administered 2022-02-17: 2 via TRANSDERMAL

## 2022-02-17 MED ORDER — SODIUM CHLORIDE 0.9 % IV BOLUS
0.9 % | Freq: Once | INTRAVENOUS | Status: AC
Start: 2022-02-17 — End: 2022-02-16
  Administered 2022-02-17: 1000 mL via INTRAVENOUS

## 2022-02-17 MED FILL — ISOVUE-370 76 % IV SOLN: 76 % | INTRAVENOUS | Qty: 100

## 2022-02-17 MED FILL — ONDANSETRON HCL 4 MG/2ML IJ SOLN: 4 MG/2ML | INTRAMUSCULAR | Qty: 2

## 2022-02-17 MED FILL — KETOROLAC TROMETHAMINE 30 MG/ML IJ SOLN: 30 MG/ML | INTRAMUSCULAR | Qty: 1

## 2022-02-17 MED FILL — SODIUM CHLORIDE 0.9 % IV SOLN: 0.9 % | INTRAVENOUS | Qty: 1000

## 2022-02-17 MED FILL — LIDOCAINE PAIN RELIEF 4 % EX PTCH: 4 % | CUTANEOUS | Qty: 2

## 2022-02-19 LAB — EKG 12-LEAD
Atrial Rate: 105 {beats}/min
P Axis: 58 degrees
P-R Interval: 140 ms
Q-T Interval: 358 ms
QRS Duration: 82 ms
QTc Calculation (Bazett): 473 ms
R Axis: -55 degrees
T Axis: 52 degrees
Ventricular Rate: 105 {beats}/min

## 2022-05-15 ENCOUNTER — Inpatient Hospital Stay
Admit: 2022-05-15 | Discharge: 2022-05-15 | Disposition: A | Discharge status: Home / Self Care | Attending: Emergency Medicine

## 2022-05-15 DIAGNOSIS — T2122XA Burn of second degree of abdominal wall, initial encounter: Secondary | ICD-10-CM

## 2022-05-15 LAB — POCT GLUCOSE: POC Glucose: 76 mg/dL (ref 65–117)

## 2022-05-15 MED ORDER — SILVER SULFADIAZINE 1 % EX CREA
1 % | CUTANEOUS | Status: DC
Start: 2022-05-15 — End: 2022-05-15

## 2022-05-15 MED ORDER — TETANUS-DIPHTHERIA TOXOIDS TD 5-2 LFU IM INJ
5-2 LFU | Freq: Once | INTRAMUSCULAR | Status: AC
Start: 2022-05-15 — End: 2022-05-15
  Administered 2022-05-15: 05:00:00 0.5 mL via INTRAMUSCULAR

## 2022-05-15 MED ORDER — NAPROXEN 250 MG PO TABS
250 MG | ORAL | Status: AC
Start: 2022-05-15 — End: 2022-05-15
  Administered 2022-05-15: 05:00:00 500 mg via ORAL

## 2022-05-15 MED ORDER — SILVER SULFADIAZINE 1 % EX CREA
1 % | CUTANEOUS | Status: AC
Start: 2022-05-15 — End: 2022-05-15
  Administered 2022-05-15: 05:00:00 via TOPICAL

## 2022-05-15 MED ORDER — SILVER SULFADIAZINE 1 % EX CREA
1 % | CUTANEOUS | 0 refills | 10.00 days | Status: AC
Start: 2022-05-15 — End: 2023-01-14

## 2022-05-15 MED ORDER — NAPROXEN 500 MG PO TABS
500 MG | ORAL_TABLET | Freq: Two times a day (BID) | ORAL | 0 refills | Status: DC
Start: 2022-05-15 — End: 2023-01-14

## 2022-05-15 MED FILL — SILVER SULFADIAZINE 1 % EX CREA: 1 % | CUTANEOUS | Qty: 400

## 2022-05-15 MED FILL — TENIVAC 5-2 LFU IM INJ: 5-2 LFU | INTRAMUSCULAR | Qty: 0.5

## 2022-05-15 MED FILL — SILVADENE 1 % EX CREA: 1 % | CUTANEOUS | Qty: 50

## 2022-05-15 MED FILL — NAPROXEN 250 MG PO TABS: 250 MG | ORAL | Qty: 2
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
## 2022-05-15 NOTE — ED Notes (Signed)
Pt ambulatory at discharge with prescriptions and discharge instructions reviewed. Opportunity to answer questions given.      Zadie Rhine, RN  05/15/22 [phone]

## 2022-06-28 ENCOUNTER — Emergency Department: Admit: 2022-06-29

## 2022-06-28 DIAGNOSIS — I10 Essential (primary) hypertension: Secondary | ICD-10-CM

## 2022-06-28 NOTE — ED Triage Notes (Signed)
Pt arrives ambulatory to triage with c/o back pain and chest pain that began 2 weeks ago.    Pt reports chest pain, SOB, and "bleeding inside of her eyes." Pt reports numbness and heaviness in her arms    Pt has hx of high cholesterol    Pt denies any fever, headache.    Translator: Venetia Night [phone]  Language: Spanish

## 2022-06-28 NOTE — ED Provider Notes (Shared)
[facility] EMERGENCY DEPT  EMERGENCY DEPARTMENT ENCOUNTER      Pt Name: Melinda [name]  MRN: [id number]  Birthdate [birth year]  Date of evaluation: 06/28/2022  Provider: Olivia Canter, PA-C    CHIEF COMPLAINT       Chief Complaint   Patient presents with    Back Pain         HISTORY OF PRESENT ILLNESS   (Location/Symptom, Timing/Onset, Context/Setting, Quality, Duration, Modifying Factors, Severity)  Note limiting factors.   Melinda [name] is a 41 y.o. female with history of  has a past medical history of High blood pressure, High cholesterol, and Type 2 diabetes mellitus (HCC). who presents ambulatory to [facility] ED with cc of body aches, chest pain and back pain.     Reports 3 days of body aches, fatigue, now with severe 10/10 chest and back pain. Describes chest pain as tight with occasional sharp pinching pain. Associated shortness of breath with exertion. No cough or fevers.     Denies recent trauma or injury, fevers, neck pain or stiffness, LE numbness or weakness, change in bladder or bowel function, saddle anesthesia, history of malignancy or IVDU.       PCP: No primary care provider on file.    There are no other complaints, changes or physical findings at this time.        The history is provided by the patient.         Review of External Medical Records:     Nursing Notes were reviewed.    REVIEW OF SYSTEMS    (2-9 systems for level 4, 10 or more for level 5)     Review of Systems    Except as noted above the remainder of the review of systems was reviewed and negative.       PAST MEDICAL HISTORY     Past Medical History:   Diagnosis Date    High blood pressure     High cholesterol     Type 2 diabetes mellitus (HCC)     Controled         SURGICAL HISTORY       Past Surgical History:   Procedure Laterality Date    OTHER SURGICAL HISTORY Left     Rentia attachment         CURRENT MEDICATIONS       Previous Medications    AMLODIPINE (NORVASC) 5 MG TABLET    Take 2 tablets by mouth  daily    FENOFIBRATE (TRIGLIDE) 160 MG TABLET    Take 1 tablet by mouth daily    FERROUS SULFATE (IRON 325) 325 (65 FE) MG TABLET    Take by mouth every morning (before breakfast)    HYDROCHLOROTHIAZIDE (HYDRODIURIL) 25 MG TABLET    Take 1 tablet by mouth daily    INSULIN 70-30 (NOVOLIN 70/30) (70-30) 100 UNIT PER ML INJECTION VIAL    Inject 15 Units into the skin 2 times daily    LISINOPRIL (PRINIVIL;ZESTRIL) 10 MG TABLET    Take 2 tablets by mouth daily    METOPROLOL SUCCINATE (TOPROL XL) 25 MG EXTENDED RELEASE TABLET    Take 1 tablet by mouth daily    NAPROXEN (NAPROSYN) 500 MG TABLET    Take 1 tablet by mouth 2 times daily (with meals)    SILVER SULFADIAZINE (SILVADENE) 1 % CREAM    Apply topically twice daily.    SIMVASTATIN (ZOCOR) 20 MG TABLET    Take 1 tablet  by mouth nightly       ALLERGIES     Patient has no known allergies.    FAMILY HISTORY     No family history on file.       SOCIAL HISTORY       Social History     Socioeconomic History    Marital status: Married   Tobacco Use    Smoking status: Never    Smokeless tobacco: Never   Substance and Sexual Activity    Alcohol use: Never    Drug use: Never           PHYSICAL EXAM    (up to 7 for level 4, 8 or more for level 5)     ED Triage Vitals   BP Temp Temp src Pulse Resp SpO2 Height Weight   -- -- -- -- -- -- -- --       There is no height or weight on file to calculate BMI.    Physical Exam    DIAGNOSTIC RESULTS     EKG: All EKG's are interpreted by the Emergency Department Physician who either signs or Co-signs this chart in the absence of a cardiologist.        RADIOLOGY:   Non-plain film images such as CT, Ultrasound and MRI are read by the radiologist. Plain radiographic images are visualized and preliminarily interpreted by the emergency physician with the below findings:        Interpretation per the Radiologist below, if available at the time of this note:    No orders to display        LABS:  Labs Reviewed - No data to display    All other labs  were within normal range or not returned as of this dictation.    EMERGENCY DEPARTMENT COURSE and DIFFERENTIAL DIAGNOSIS/MDM:   Vitals:  There were no vitals filed for this visit.        Medical Decision Making      # chest pain   # back pain  - ddx: with uncontrolled Htn, chest and back pain, highest concern for dissection. ACS also on the differential. ***             REASSESSMENT            CONSULTS:  None    PROCEDURES:  Unless otherwise noted below, none     Procedures      FINAL IMPRESSION    No diagnosis found.      DISPOSITION/PLAN   DISPOSITION        PATIENT REFERRED TO:  No follow-up provider specified.    DISCHARGE MEDICATIONS:  New Prescriptions    No medications on file         (Please note that portions of this note were completed with a voice recognition program.  Efforts were made to edit the dictations but occasionally words are mis-transcribed.)    Olivia Canter, PA-C (electronically signed)  Emergency Attending Physician / Physician Assistant / Nurse Practitioner

## 2022-06-28 NOTE — ED Notes (Signed)
Bedside and Verbal shift change report given to Mallie Mussel (Soil scientist) by Delos Haring (offgoing nurse). Report included the following information ED Encounter Summary, ED SBAR, MAR, and Recent Results.        Reeves Forth, RN  06/28/22 [phone]

## 2022-06-29 ENCOUNTER — Inpatient Hospital Stay
Admit: 2022-06-29 | Discharge: 2022-06-29 | Disposition: A | Discharge status: Home / Self Care | Attending: Emergency Medicine

## 2022-06-29 LAB — COMPREHENSIVE METABOLIC PANEL
ALT: 23 U/L (ref 12–78)
AST: 19 U/L (ref 15–37)
Albumin/Globulin Ratio: 0.6 — ABNORMAL LOW (ref 1.1–2.2)
Albumin: 3 g/dL — ABNORMAL LOW (ref 3.5–5.0)
Alk Phosphatase: 133 U/L — ABNORMAL HIGH (ref 45–117)
Anion Gap: 9 mmol/L (ref 5–15)
BUN: 32 MG/DL — ABNORMAL HIGH (ref 6–20)
Bun/Cre Ratio: 23 — ABNORMAL HIGH (ref 12–20)
CO2: 24 mmol/L (ref 21–32)
Calcium: 8.9 MG/DL (ref 8.5–10.1)
Chloride: 102 mmol/L (ref 97–108)
Creatinine: 1.37 MG/DL — ABNORMAL HIGH (ref 0.55–1.02)
Est, Glom Filt Rate: 50 mL/min/{1.73_m2} — ABNORMAL LOW (ref >60)
Globulin: 4.8 g/dL — ABNORMAL HIGH (ref 2.0–4.0)
Glucose: 342 mg/dL — ABNORMAL HIGH (ref 65–100)
Potassium: 3.7 mmol/L (ref 3.5–5.1)
Sodium: 135 mmol/L — ABNORMAL LOW (ref 136–145)
Total Bilirubin: 0.2 MG/DL (ref 0.2–1.0)
Total Protein: 7.8 g/dL (ref 6.4–8.2)

## 2022-06-29 LAB — CBC WITH AUTO DIFFERENTIAL
Absolute Immature Granulocyte: 0 10*3/uL (ref 0.00–0.04)
Basophils %: 1 % (ref 0–1)
Basophils Absolute: 0.1 10*3/uL (ref 0.0–0.1)
Eosinophils %: 6 % (ref 0–7)
Eosinophils Absolute: 0.5 10*3/uL — ABNORMAL HIGH (ref 0.0–0.4)
Hematocrit: 28.4 % — ABNORMAL LOW (ref 35.0–47.0)
Hemoglobin: 9 g/dL — ABNORMAL LOW (ref 11.5–16.0)
Immature Granulocytes: 0 % (ref 0.0–0.5)
Lymphocytes %: 32 % (ref 12–49)
Lymphocytes Absolute: 2.7 10*3/uL (ref 0.8–3.5)
MCH: 24.4 PG — ABNORMAL LOW (ref 26.0–34.0)
MCHC: 31.7 g/dL (ref 30.0–36.5)
MCV: 77 FL — ABNORMAL LOW (ref 80.0–99.0)
MPV: 9.3 FL (ref 8.9–12.9)
Monocytes %: 7 % (ref 5–13)
Monocytes Absolute: 0.6 10*3/uL (ref 0.0–1.0)
Neutrophils %: 54 % (ref 32–75)
Neutrophils Absolute: 4.6 10*3/uL (ref 1.8–8.0)
Nucleated RBCs: 0 PER 100 WBC
Platelets: 492 10*3/uL — ABNORMAL HIGH (ref 150–400)
RBC: 3.69 M/uL — ABNORMAL LOW (ref 3.80–5.20)
RDW: 15.2 % — ABNORMAL HIGH (ref 11.5–14.5)
WBC: 8.5 10*3/uL (ref 3.6–11.0)
nRBC: 0 10*3/uL (ref 0.00–0.01)

## 2022-06-29 LAB — TROPONIN
Troponin, High Sensitivity: 9 ng/L (ref 0–51)
Troponin, High Sensitivity: 9 ng/L (ref 0–51)

## 2022-06-29 MED ORDER — METOPROLOL SUCCINATE ER 25 MG PO TB24
25 MG | ORAL | Status: AC
Start: 2022-06-29 — End: 2022-06-29
  Administered 2022-06-29: 06:00:00 25 mg via ORAL

## 2022-06-29 MED ORDER — IOPAMIDOL 76 % IV SOLN
76 % | Freq: Once | INTRAVENOUS | Status: AC | PRN
Start: 2022-06-29 — End: 2022-06-29
  Administered 2022-06-29: 04:00:00 100 mL via INTRAVENOUS

## 2022-06-29 MED ORDER — METOPROLOL SUCCINATE ER 25 MG PO TB24
25 MG | ORAL_TABLET | Freq: Every day | ORAL | 1 refills | 90.00 days | Status: AC
Start: 2022-06-29 — End: 2023-01-14

## 2022-06-29 MED ORDER — ACETAMINOPHEN 325 MG PO TABS
325 MG | ORAL | Status: AC
Start: 2022-06-29 — End: 2022-06-29
  Administered 2022-06-29: 06:00:00 650 mg via ORAL

## 2022-06-29 MED ORDER — AMLODIPINE BESYLATE 5 MG PO TABS
5 MG | ORAL_TABLET | Freq: Every day | ORAL | 1 refills | Status: AC
Start: 2022-06-29 — End: 2022-08-28

## 2022-06-29 MED ORDER — HYDROCHLOROTHIAZIDE 25 MG PO TABS
25 MG | ORAL_TABLET | Freq: Every morning | ORAL | 1 refills | Status: AC
Start: 2022-06-29 — End: 2023-01-14

## 2022-06-29 MED ORDER — SIMVASTATIN 20 MG PO TABS
20 MG | ORAL_TABLET | Freq: Every evening | ORAL | 1 refills | Status: AC
Start: 2022-06-29 — End: 2022-08-28

## 2022-06-29 MED ORDER — LABETALOL HCL 5 MG/ML IV SOLN
5 MG/ML | Freq: Once | INTRAVENOUS | Status: AC
Start: 2022-06-29 — End: 2022-06-29
  Administered 2022-06-29: 05:00:00 20 mg via INTRAVENOUS

## 2022-06-29 MED FILL — LABETALOL HCL 5 MG/ML IV SOLN: 5 MG/ML | INTRAVENOUS | Qty: 4

## 2022-06-29 MED FILL — ACETAMINOPHEN 325 MG PO TABS: 325 MG | ORAL | Qty: 2

## 2022-06-29 MED FILL — METOPROLOL SUCCINATE ER 25 MG PO TB24: 25 MG | ORAL | Qty: 1

## 2022-06-29 NOTE — ED Notes (Signed)
Notified doctor Ronnald Ramp regarding patients blood pressure. Patient is alert and oriented. Denies any chest pain at this time.     Ward Givens, RN  06/29/22 [phone]

## 2022-06-29 NOTE — Discharge Instructions (Addendum)
Your blood pressure today was extremely elevated.  We are going to write you for 3 different blood pressure medications and you should take all of them.  It is very important that you follow-up with your primary care doctor to talk about blood pressure control, as elevated blood pressure over time can cause damage to your kidneys, heart and other organs.  In the short-term, it can make you feel tired, cause headaches and cause other generalized symptoms.    If you have worsening chest pain, difficulty urinating, worsening shortness of breath, you should return to the emergency room for reevaluation.    You need to follow-up with your primary care doctor.  You also need to follow-up with cardiology, your cardiac work-up today showed that you do not have any blood clots, you are not having a heart attack.  However, the cardiologist needs to do a full evaluation given that you are having worsening chest pain and shortness of breath.      If your blood pressure is greater than 120/80 we urge that you seek medical care to address the potential of high blood pressure, commonly know as Hypertension.   Hypertension maybe hereditary or can caused by certain medical conditions, pain, stress and "white coat syndrome." If you are already on blood pressure medications, you should talk to the provider who prescribes these medications to determine if any changes are needed.     Please make an appointment with your health care provider(s) for follow up of this visit. Take this sheet with you when you go to your follow-up visit as it contains important information needed for your continuum of care.     VITALS:   Patient Vitals for the past 24 hrs:   BP Temp Temp src Pulse Resp SpO2 Height Weight   06/29/22 0142 (!) 165/97 -- -- (!) 102 -- -- -- --   06/29/22 0102 (!) 196/112 -- -- (!) 108 -- -- -- --   06/29/22 0030 -- 98.4 F (36.9 C) Oral -- -- -- -- --   06/28/22 2121 (!) 210/126 98.6 F (37 C) Oral (!) 108 16 100 % 1.372 m (4'  6") 59.9 kg (132 lb)

## 2022-06-29 NOTE — ED Notes (Signed)
Discharge instruction given to patient with understanding. No complain of chest pain or dizziness. Patient is not in distress. Patient is ambulatory.     Ward Givens, RN  06/29/22 0225

## 2022-06-30 LAB — EKG 12-LEAD
Atrial Rate: 108 {beats}/min
P Axis: 51 degrees
P-R Interval: 142 ms
Q-T Interval: 362 ms
QRS Duration: 84 ms
QTc Calculation (Bazett): 485 ms
R Axis: 246 degrees
T Axis: 54 degrees
Ventricular Rate: 108 {beats}/min

## 2022-06-30 LAB — COVID-19: SARS-CoV-2: NOT DETECTED

## 2022-09-17 ENCOUNTER — Inpatient Hospital Stay: Admit: 2022-09-17 | Primary: Adult Health

## 2022-09-17 LAB — COMPREHENSIVE METABOLIC PANEL
ALT: UNDETERMINED U/L (ref 12–78)
AST: 16 U/L (ref 15–37)
Albumin/Globulin Ratio: 0.6 — ABNORMAL LOW (ref 1.1–2.2)
Albumin: 2.6 g/dL — ABNORMAL LOW (ref 3.5–5.0)
Alk Phosphatase: 131 U/L — ABNORMAL HIGH (ref 45–117)
Anion Gap: 7 mmol/L (ref 5–15)
BUN: 24 MG/DL — ABNORMAL HIGH (ref 6–20)
Bun/Cre Ratio: 27 — ABNORMAL HIGH (ref 12–20)
CO2: 22 mmol/L (ref 21–32)
Calcium: 8.2 MG/DL — ABNORMAL LOW (ref 8.5–10.1)
Chloride: 105 mmol/L (ref 97–108)
Creatinine: 0.89 MG/DL (ref 0.55–1.02)
Est, Glom Filt Rate: >60 mL/min/{1.73_m2} (ref >60)
Globulin: 4.2 g/dL — ABNORMAL HIGH (ref 2.0–4.0)
Glucose: 225 mg/dL — ABNORMAL HIGH (ref 65–100)
Potassium: 4.2 mmol/L (ref 3.5–5.1)
Sodium: 134 mmol/L — ABNORMAL LOW (ref 136–145)
Total Bilirubin: 0.2 MG/DL (ref 0.2–1.0)
Total Protein: 6.8 g/dL (ref 6.4–8.2)

## 2022-09-17 LAB — CBC
Hematocrit: 31.8 % — ABNORMAL LOW (ref 35.0–47.0)
Hemoglobin: 9.6 g/dL — ABNORMAL LOW (ref 11.5–16.0)
MCH: 23.8 PG — ABNORMAL LOW (ref 26.0–34.0)
MCHC: 30.2 g/dL (ref 30.0–36.5)
MCV: 78.9 FL — ABNORMAL LOW (ref 80.0–99.0)
MPV: 10 FL (ref 8.9–12.9)
Nucleated RBCs: 0 PER 100 WBC
Platelets: 479 10*3/uL — ABNORMAL HIGH (ref 150–400)
RBC: 4.03 M/uL (ref 3.80–5.20)
RDW: 16.9 % — ABNORMAL HIGH (ref 11.5–14.5)
WBC: 8.7 10*3/uL (ref 3.6–11.0)
nRBC: 0 10*3/uL (ref 0.00–0.01)

## 2022-09-17 LAB — LIPID PANEL
Chol/HDL Ratio: 10.3 — ABNORMAL HIGH (ref 0.0–5.0)
Cholesterol, Total: 341 MG/DL — ABNORMAL HIGH (ref <200)
HDL: 33 MG/DL
LDL Calculated: ELEVATED MG/DL (ref 0–100)
Triglycerides: 1573 MG/DL — ABNORMAL HIGH (ref <150)

## 2022-09-17 LAB — TSH: TSH, 3RD GENERATION: 1.86 u[IU]/mL (ref 0.36–3.74)

## 2022-09-17 LAB — HEMOGLOBIN A1C
Hemoglobin A1C: 9 % — ABNORMAL HIGH (ref 4.0–5.6)
eAG: 212 mg/dL

## 2022-09-17 LAB — LDL CHOLESTEROL, DIRECT: LDL Direct: 93 mg/dl (ref 0–100)

## 2022-12-09 ENCOUNTER — Inpatient Hospital Stay: Admit: 2022-12-10 | Primary: Adult Health

## 2022-12-10 LAB — COMPREHENSIVE METABOLIC PANEL
ALT: 18 U/L (ref 12–78)
AST: 32 U/L (ref 15–37)
Albumin/Globulin Ratio: 0.7 — ABNORMAL LOW (ref 1.1–2.2)
Albumin: 2.5 g/dL — ABNORMAL LOW (ref 3.5–5.0)
Alk Phosphatase: 108 U/L (ref 45–117)
Anion Gap: 10 mmol/L (ref 5–15)
BUN: 23 MG/DL — ABNORMAL HIGH (ref 6–20)
Bun/Cre Ratio: 18 (ref 12–20)
CO2: 18 mmol/L — ABNORMAL LOW (ref 21–32)
Calcium: 8.3 MG/DL — ABNORMAL LOW (ref 8.5–10.1)
Chloride: 111 mmol/L — ABNORMAL HIGH (ref 97–108)
Creatinine: 1.26 MG/DL — ABNORMAL HIGH (ref 0.55–1.02)
Est, Glom Filt Rate: 55 mL/min/{1.73_m2} — ABNORMAL LOW (ref >60)
Globulin: 3.8 g/dL (ref 2.0–4.0)
Glucose: 191 mg/dL — ABNORMAL HIGH (ref 65–100)
Potassium: 5 mmol/L (ref 3.5–5.1)
Sodium: 139 mmol/L (ref 136–145)
Total Bilirubin: 0.3 MG/DL (ref 0.2–1.0)
Total Protein: 6.3 g/dL — ABNORMAL LOW (ref 6.4–8.2)

## 2023-01-13 ENCOUNTER — Emergency Department: Admit: 2023-01-14 | Payer: MEDICAID | Primary: Adult Health

## 2023-01-13 DIAGNOSIS — A419 Sepsis, unspecified organism: Secondary | ICD-10-CM

## 2023-01-13 DIAGNOSIS — K921 Melena: Secondary | ICD-10-CM

## 2023-01-13 NOTE — ED Provider Notes (Shared)
[facility] EMERGENCY DEPT  EMERGENCY DEPARTMENT ENCOUNTER      Pt Name: Melinda [name]  MRN: [id number]  Birthdate 1981/02/06  Date of evaluation: 01/13/2023  Provider: Malissa Hippo, DO    CHIEF COMPLAINT       Chief Complaint   Patient presents with    Abdominal Pain    Emesis       PMH   Past Medical History:   Diagnosis Date    High blood pressure     High cholesterol     Type 2 diabetes mellitus (HCC)     Controled         MDM:   Vitals:    Vitals:    01/13/23 2108   BP: (!) 177/104   Pulse: (!) 106   Resp: 18   Temp: 98.4 F (36.9 C)   SpO2: 100%           This is a 42 y.o. female with pmhx diabetes, hypertension, high cholesterol who presents today for cc of multiple complaints. She is spanish speaking, daughter at bedside provides translation at the patient's request. She was offered translation services which she politely declined and requests we use her daughter instead. Patient states that over the last couple weeks she has had intermittent epigastric abdominal pain, radiating to her back. It seems to come and go, feels sharp and aching. She sometimes feels like the pain is worse with deep breathing and sometimes the pain goes up to her chest. She denies dyspnea. Does not take hormones or have history of clotting disorders.  She states that when the pain is present it is a 10/10, but currently it is closer to a 4/10. Nothing seems to make it better, food sometimes makes it worse but it is inconsistent. She has nausea with this, and occasional nonbilious nonbloody emesis. Last vomited today. Has felt warm and cold at times but no fevers. No urinary complaints or changes in bowel habits. She notes that over the last few days she gets very dizzy upon standing, feeling lightheaded. She usually takes her blood pressure twice daily at home, and noticed that when she was laying flat it was normal or a little high, and when she stood up and felt lightheaded it was very low.     On arrival VS include mild  tachycardia, otherwise stable. Marland Kitchen   Physical Exam  General: Alert, no acute distress  HEENT: Normocephalic, atraumatic. EOMI, dry oral mucosa, no conjunctival injection  Neck: ROM normal, supple  Cardio: Heart regular rate and rhythm, cap refill <2seconds  Lungs: No respiratory distress, no wheezing, CTAB  Abdomen: Soft, nontender. Negative murphy's. Negative CVA tenderness.   MSK: ROM normal, no LE edema  Skin: Warm, dry, no rash  Neuro: No focal neurodeficits, Aox3    Patient given morphine, fluids and zofran for pain control. Heart score ***, well's low risk. Dimer ***    ***    Diagnosis is ***. Disposition is {dispo:63338}. Workup and plan discussed with {dispodiscuss:63339}, {dispo choices:63340::"all questions answered","they are in agreement with the  plan "}.                      Total critical care time (not including time spent performing separately reportable procedures): ***        Review of external notes and Independent historians utilized in decision making: {Review of external notes:63318}    Diagnostics independently interpreted by me: {Diagnostics:63319}    Discussions with other clinicians and  healthcare agents:  {clinicians:63320}    Risks considered in patient's treatment plan: {Risks considered:63322}        HISTORY OF PRESENT ILLNESS   (Location/Symptom, Timing/Onset, Context/Setting, Quality, Duration, Modifying Factors, Severity)  Note limiting factors.   See MDM    Nursing Notes were reviewed.    REVIEW OF SYSTEMS    (2-9 systems for level 4, 10 or more for level 5)   See MDM    PAST MEDICAL HISTORY     Past Medical History:   Diagnosis Date    High blood pressure     High cholesterol     Type 2 diabetes mellitus (HCC)     Controled       SURGICAL HISTORY       Past Surgical History:   Procedure Laterality Date    OTHER SURGICAL HISTORY Left     Rentia attachment       CURRENT MEDICATIONS       Previous Medications    AMLODIPINE (NORVASC) 5 MG TABLET    Take 2 tablets by mouth daily     AMLODIPINE (NORVASC) 5 MG TABLET    Take 2 tablets by mouth daily    FENOFIBRATE (TRIGLIDE) 160 MG TABLET    Take 1 tablet by mouth daily    FERROUS SULFATE (IRON 325) 325 (65 FE) MG TABLET    Take by mouth every morning (before breakfast)    HYDROCHLOROTHIAZIDE (HYDRODIURIL) 25 MG TABLET    Take 1 tablet by mouth daily    HYDROCHLOROTHIAZIDE (HYDRODIURIL) 25 MG TABLET    Take 1 tablet by mouth every morning    INSULIN 70-30 (NOVOLIN 70/30) (70-30) 100 UNIT PER ML INJECTION VIAL    Inject 15 Units into the skin 2 times daily    LISINOPRIL (PRINIVIL;ZESTRIL) 10 MG TABLET    Take 2 tablets by mouth daily    METOPROLOL SUCCINATE (TOPROL XL) 25 MG EXTENDED RELEASE TABLET    Take 1 tablet by mouth daily    METOPROLOL SUCCINATE (TOPROL XL) 25 MG EXTENDED RELEASE TABLET    Take 1 tablet by mouth daily    NAPROXEN (NAPROSYN) 500 MG TABLET    Take 1 tablet by mouth 2 times daily (with meals)    SILVER SULFADIAZINE (SILVADENE) 1 % CREAM    Apply topically twice daily.    SIMVASTATIN (ZOCOR) 20 MG TABLET    Take 1 tablet by mouth nightly    SIMVASTATIN (ZOCOR) 20 MG TABLET    Take 1 tablet by mouth nightly       ALLERGIES     Patient has no known allergies.    FAMILY HISTORY     No family history on file.       SOCIAL HISTORY       Social History     Socioeconomic History    Marital status: Married   Tobacco Use    Smoking status: Never    Smokeless tobacco: Never   Substance and Sexual Activity    Alcohol use: Never    Drug use: Never       PHYSICAL EXAM    (up to 7 for level 4, 8 or more for level 5)     ED Triage Vitals [01/13/23 2108]   BP Temp Temp Source Pulse Respirations SpO2 Height Weight - Scale   (!) 177/104 98.4 F (36.9 C) Oral (!) 106 18 100 % 1.499 m (4\' 11" ) 65.8 kg (145 lb)       Body  mass index is 29.29 kg/m.    Physical Exam    See mdm    DIAGNOSTIC RESULTS     EKG: All EKG's are interpreted by the Emergency Department Physician who either signs or Co-signs this chart in the absence of a  cardiologist.        RADIOLOGY:   Non-plain film images such as CT, Ultrasound and MRI are read by the radiologist.    Interpretation per the Radiologist below, if available at the time of this note:    US ABDOMEN LIMITED    (Results Pending)   XR CHEST 1 VIEW    (Results Pending)        LABS:  Labs Reviewed   CBC WITH AUTO DIFFERENTIAL - Abnormal; Notable for the following components:       Result Value    WBC 11.6 (*)     RBC 3.33 (*)     Hemoglobin 7.7 (*)     Hematocrit 24.3 (*)     MCV 73.0 (*)     MCH 23.1 (*)     RDW 16.6 (*)     Platelets 507 (*)     Eosinophils Absolute 0.5 (*)     All other components within normal limits   COMPREHENSIVE METABOLIC PANEL   TROPONIN   LACTIC ACID   LIPASE   URINALYSIS WITH MICROSCOPIC   D-DIMER, QUANTITATIVE   POC PREGNANCY UR-QUAL       All other labs were within normal range or not returned as of this dictation.        PROCEDURES:  Unless otherwise noted below, none     Procedures    See MDM for documentation of critical care time.       FINAL IMPRESSION    No diagnosis found.      DISPOSITION/PLAN   DISPOSITION        PATIENT REFERRED TO:  No follow-up provider specified.    DISCHARGE MEDICATIONS:  New Prescriptions    No medications on file         (Please note that portions of this note were completed with a voice recognition program.  Efforts were made to edit the dictations but occasionally words are mis-transcribed.)    Malissa Hippo, DO (electronically signed)  Emergency Attending Physician / Physician Assistant / Nurse Practitioner

## 2023-01-13 NOTE — ED Triage Notes (Signed)
Pt reports 2 weeks of n/v, abd pain that radiates to the back, and low BP (pt reports 70/50). Surgery on L eye for vision issues 3 days prior to symptom onset. Provider in triage.

## 2023-01-13 NOTE — Consults (Signed)
Session ID: [id number]  Language: Spanish  Interpreter ID: [id number]  Interpreter Name: Thayer Ohm

## 2023-01-14 ENCOUNTER — Emergency Department: Admit: 2023-01-14 | Payer: MEDICAID | Primary: Adult Health

## 2023-01-14 ENCOUNTER — Inpatient Hospital Stay
Admission: EM | Admit: 2023-01-14 | Discharge: 2023-01-20 | Disposition: A | Discharge status: Home / Self Care | Payer: MEDICAID | Admitting: Internal Medicine

## 2023-01-14 LAB — URINALYSIS WITH MICROSCOPIC
Bilirubin Urine: NEGATIVE
Glucose, UA: 500 mg/dL — AB
Ketones, Urine: NEGATIVE mg/dL
Nitrite, Urine: NEGATIVE
Protein, UA: 300 mg/dL — AB
Specific Gravity, UA: 1.016 (ref 1.003–1.030)
Urobilinogen, Urine: 0.2 EU/dL (ref 0.2–1.0)
pH, Urine: 5.5 (ref 5.0–8.0)

## 2023-01-14 LAB — CBC WITH AUTO DIFFERENTIAL
Absolute Immature Granulocyte: 0 10*3/uL (ref 0.00–0.04)
Basophils %: 1 % (ref 0–1)
Basophils %: 1 % (ref 0–1)
Basophils Absolute: 0.1 10*3/uL (ref 0.0–0.1)
Basophils Absolute: 0.1 10*3/uL (ref 0.0–0.1)
Eosinophils %: 5 % (ref 0–7)
Eosinophils %: 5 % (ref 0–7)
Eosinophils Absolute: 0.5 10*3/uL — ABNORMAL HIGH (ref 0.0–0.4)
Eosinophils Absolute: 0.6 10*3/uL — ABNORMAL HIGH (ref 0.0–0.4)
Hematocrit: 24.3 % — ABNORMAL LOW (ref 35.0–47.0)
Hematocrit: 24.7 % — ABNORMAL LOW (ref 35.0–47.0)
Hemoglobin: 7.7 g/dL — ABNORMAL LOW (ref 11.5–16.0)
Hemoglobin: 7.6 g/dL — ABNORMAL LOW (ref 11.5–16.0)
Immature Granulocytes %: 0 % (ref 0.0–0.5)
Immature Granulocytes Absolute: 0 10*3/uL (ref 0.00–0.04)
Immature Granulocytes: 0 % (ref 0.0–0.5)
Lymphocytes %: 24 % (ref 12–49)
Lymphocytes %: 29 % (ref 12–49)
Lymphocytes Absolute: 2.8 10*3/uL (ref 0.8–3.5)
Lymphocytes Absolute: 3.5 10*3/uL (ref 0.8–3.5)
MCH: 23.1 PG — ABNORMAL LOW (ref 26.0–34.0)
MCH: 22.8 PG — ABNORMAL LOW (ref 26.0–34.0)
MCHC: 31.7 g/dL (ref 30.0–36.5)
MCHC: 30.8 g/dL (ref 30.0–36.5)
MCV: 73 FL — ABNORMAL LOW (ref 80.0–99.0)
MCV: 74 FL — ABNORMAL LOW (ref 80.0–99.0)
MPV: 9.3 FL (ref 8.9–12.9)
MPV: 9.3 FL (ref 8.9–12.9)
Monocytes %: 5 % (ref 5–13)
Monocytes %: 6 % (ref 5–13)
Monocytes Absolute: 0.6 10*3/uL (ref 0.0–1.0)
Monocytes Absolute: 0.7 10*3/uL (ref 0.0–1.0)
Neutrophils %: 65 % (ref 32–75)
Neutrophils %: 59 % (ref 32–75)
Neutrophils Absolute: 7.6 10*3/uL (ref 1.8–8.0)
Neutrophils Absolute: 7.3 10*3/uL (ref 1.8–8.0)
Nucleated RBCs: 0 PER 100 WBC
Nucleated RBCs: 0 PER 100 WBC
Platelets: 507 10*3/uL — ABNORMAL HIGH (ref 150–400)
Platelets: 477 10*3/uL — ABNORMAL HIGH (ref 150–400)
RBC: 3.33 M/uL — ABNORMAL LOW (ref 3.80–5.20)
RBC: 3.34 M/uL — ABNORMAL LOW (ref 3.80–5.20)
RDW: 16.6 % — ABNORMAL HIGH (ref 11.5–14.5)
RDW: 16.4 % — ABNORMAL HIGH (ref 11.5–14.5)
WBC: 11.6 10*3/uL — ABNORMAL HIGH (ref 3.6–11.0)
WBC: 12.2 10*3/uL — ABNORMAL HIGH (ref 3.6–11.0)
nRBC: 0 10*3/uL (ref 0.00–0.01)
nRBC: 0 10*3/uL (ref 0.00–0.01)

## 2023-01-14 LAB — COMPREHENSIVE METABOLIC PANEL
ALT: 22 U/L (ref 12–78)
AST: 17 U/L (ref 15–37)
Albumin/Globulin Ratio: 0.6 — ABNORMAL LOW (ref 1.1–2.2)
Albumin: 2.6 g/dL — ABNORMAL LOW (ref 3.5–5.0)
Alk Phosphatase: 116 U/L (ref 45–117)
Anion Gap: 4 mmol/L — ABNORMAL LOW (ref 5–15)
BUN: 28 MG/DL — ABNORMAL HIGH (ref 6–20)
Bun/Cre Ratio: 19 (ref 12–20)
CO2: 24 mmol/L (ref 21–32)
Calcium: 8.4 MG/DL — ABNORMAL LOW (ref 8.5–10.1)
Chloride: 109 mmol/L — ABNORMAL HIGH (ref 97–108)
Creatinine: 1.45 MG/DL — ABNORMAL HIGH (ref 0.55–1.02)
Est, Glom Filt Rate: 47 mL/min/{1.73_m2} — ABNORMAL LOW (ref >60)
Globulin: 4.2 g/dL — ABNORMAL HIGH (ref 2.0–4.0)
Glucose: 209 mg/dL — ABNORMAL HIGH (ref 65–100)
Potassium: 4.2 mmol/L (ref 3.5–5.1)
Sodium: 137 mmol/L (ref 136–145)
Total Bilirubin: 0.2 MG/DL (ref 0.2–1.0)
Total Protein: 6.8 g/dL (ref 6.4–8.2)

## 2023-01-14 LAB — POC LACTIC ACID: POC Lactic Acid: 2.63 mmol/L (ref 0.40–2.00)

## 2023-01-14 LAB — IRON AND TIBC
Iron % Saturation: 5 % — ABNORMAL LOW (ref 20–50)
Iron: 14 ug/dL — ABNORMAL LOW (ref 35–150)
TIBC: 274 ug/dL (ref 250–450)

## 2023-01-14 LAB — TROPONIN: Troponin, High Sensitivity: 11 ng/L (ref 0–51)

## 2023-01-14 LAB — POCT GLUCOSE
POC Glucose: 233 mg/dL — ABNORMAL HIGH (ref 65–117)
POC Glucose: 228 mg/dL — ABNORMAL HIGH (ref 65–117)
POC Glucose: 152 mg/dL — ABNORMAL HIGH (ref 65–117)

## 2023-01-14 LAB — FERRITIN: Ferritin: 5 NG/ML — ABNORMAL LOW (ref 26–388)

## 2023-01-14 LAB — POC PREGNANCY UR-QUAL: Preg Test, Ur: NEGATIVE

## 2023-01-14 LAB — LIPASE: Lipase: 46 U/L (ref 13–75)

## 2023-01-14 LAB — D-DIMER, QUANTITATIVE: D-Dimer, Quant: 0.66 mg/L FEU — ABNORMAL HIGH (ref 0.00–0.65)

## 2023-01-14 LAB — LACTIC ACID: Lactic Acid, Plasma: <0.3 MMOL/L — ABNORMAL LOW (ref 0.4–2.0)

## 2023-01-14 MED ORDER — MORPHINE SULFATE (PF) 4 MG/ML IJ SOLN
4 | INTRAMUSCULAR | Status: AC
Start: 2023-01-14 — End: 2023-01-14
  Administered 2023-01-14: 07:00:00 4 mg via INTRAVENOUS

## 2023-01-14 MED ORDER — POTASSIUM BICARB-CITRIC ACID 20 MEQ PO TBEF
20 MEQ | ORAL | Status: DC | PRN
Start: 2023-01-14 — End: 2023-01-20

## 2023-01-14 MED ORDER — STERILE WATER FOR INJECTION (MIXTURES ONLY)
1 | INTRAMUSCULAR | Status: AC
Start: 2023-01-14 — End: 2023-01-14
  Administered 2023-01-14: 10:00:00 1000 mg via INTRAVENOUS

## 2023-01-14 MED ORDER — IOPAMIDOL 76 % IV SOLN
76 | Freq: Once | INTRAVENOUS | Status: AC | PRN
Start: 2023-01-14 — End: 2023-01-14
  Administered 2023-01-14: 05:00:00 100 mL via INTRAVENOUS

## 2023-01-14 MED ORDER — OXYCODONE HCL 5 MG PO TABS
5 MG | ORAL | Status: DC | PRN
Start: 2023-01-14 — End: 2023-01-20
  Administered 2023-01-15 – 2023-01-19 (×12): 5 mg via ORAL

## 2023-01-14 MED ORDER — METOPROLOL SUCCINATE ER 25 MG PO TB24
25 MG | Freq: Every day | ORAL | Status: DC
Start: 2023-01-14 — End: 2023-01-19
  Administered 2023-01-14 – 2023-01-18 (×5): 25 mg via ORAL

## 2023-01-14 MED ORDER — ONDANSETRON HCL 4 MG/2ML IJ SOLN
42 MG/2ML | Freq: Three times a day (TID) | INTRAMUSCULAR | Status: DC
Start: 2023-01-14 — End: 2023-01-19
  Administered 2023-01-14 – 2023-01-19 (×14): 4 mg via INTRAVENOUS

## 2023-01-14 MED ORDER — ONDANSETRON 4 MG PO TBDP
4 MG | Freq: Three times a day (TID) | ORAL | Status: DC | PRN
Start: 2023-01-14 — End: 2023-01-20

## 2023-01-14 MED ORDER — LACTATED RINGERS IV BOLUS
INTRAVENOUS | Status: AC
Start: 2023-01-14 — End: 2023-01-14
  Administered 2023-01-14: 07:00:00 1000 mL via INTRAVENOUS

## 2023-01-14 MED ORDER — ACETAMINOPHEN 650 MG RE SUPP
650 MG | Freq: Four times a day (QID) | RECTAL | Status: DC | PRN
Start: 2023-01-14 — End: 2023-01-20

## 2023-01-14 MED ORDER — MORPHINE SULFATE (PF) 4 MG/ML IJ SOLN
4 | INTRAMUSCULAR | Status: AC
Start: 2023-01-14 — End: 2023-01-13
  Administered 2023-01-14: 02:00:00 4 mg via INTRAVENOUS

## 2023-01-14 MED ORDER — SODIUM CHLORIDE (PF) 0.9 % IJ SOLN
0.9 | Freq: Every day | INTRAMUSCULAR | Status: DC
Start: 2023-01-14 — End: 2023-01-14
  Administered 2023-01-14: 16:00:00 40 mg via INTRAVENOUS

## 2023-01-14 MED ORDER — INSULIN GLARGINE 100 UNIT/ML SC SOLN
100 | Freq: Every day | SUBCUTANEOUS | Status: DC
Start: 2023-01-14 — End: 2023-01-14
  Administered 2023-01-14: 13:00:00 17 [IU] via SUBCUTANEOUS

## 2023-01-14 MED ORDER — MAGNESIUM SULFATE 2000 MG/50 ML IVPB PREMIX
250 GM/50ML | INTRAVENOUS | Status: DC | PRN
Start: 2023-01-14 — End: 2023-01-20

## 2023-01-14 MED ORDER — ONDANSETRON HCL 4 MG/2ML IJ SOLN
4 | Freq: Once | INTRAMUSCULAR | Status: AC
Start: 2023-01-14 — End: 2023-01-13
  Administered 2023-01-14: 02:00:00 4 mg via INTRAVENOUS

## 2023-01-14 MED ORDER — LACTATED RINGERS IV BOLUS
INTRAVENOUS | Status: AC
Start: 2023-01-14 — End: 2023-01-13
  Administered 2023-01-14: 02:00:00 1000 mL via INTRAVENOUS

## 2023-01-14 MED ORDER — SODIUM CHLORIDE (PF) 0.9 % IJ SOLN
0.9 | Freq: Two times a day (BID) | INTRAMUSCULAR | Status: DC
Start: 2023-01-14 — End: 2023-01-20
  Administered 2023-01-15 – 2023-01-20 (×11): 40 mg via INTRAVENOUS

## 2023-01-14 MED ORDER — ATORVASTATIN CALCIUM 10 MG PO TABS
10 MG | Freq: Every day | ORAL | Status: DC
Start: 2023-01-14 — End: 2023-01-20
  Administered 2023-01-14 – 2023-01-18 (×5): 10 mg via ORAL

## 2023-01-14 MED ORDER — POTASSIUM CHLORIDE ER 10 MEQ PO TBCR
10 MEQ | ORAL | Status: DC | PRN
Start: 2023-01-14 — End: 2023-01-20

## 2023-01-14 MED ORDER — ENOXAPARIN SODIUM 40 MG/0.4ML IJ SOSY
400.4 MG/0.4ML | Freq: Every day | INTRAMUSCULAR | Status: DC
Start: 2023-01-14 — End: 2023-01-15
  Administered 2023-01-14: 13:00:00 40 mg via SUBCUTANEOUS

## 2023-01-14 MED ORDER — MORPHINE SULFATE (PF) 2 MG/ML IV SOLN
2 MG/ML | INTRAVENOUS | Status: DC | PRN
Start: 2023-01-14 — End: 2023-01-20
  Administered 2023-01-14 – 2023-01-20 (×14): 2 mg via INTRAVENOUS

## 2023-01-14 MED ORDER — FENOFIBRATE 160 MG PO TABS
160 MG | Freq: Every day | ORAL | Status: DC
Start: 2023-01-14 — End: 2023-01-20
  Administered 2023-01-14 – 2023-01-18 (×5): 160 mg via ORAL

## 2023-01-14 MED ORDER — ONDANSETRON HCL 4 MG/2ML IJ SOLN
42 MG/2ML | Freq: Four times a day (QID) | INTRAMUSCULAR | Status: DC | PRN
Start: 2023-01-14 — End: 2023-01-20
  Administered 2023-01-14 – 2023-01-19 (×4): 4 mg via INTRAVENOUS

## 2023-01-14 MED ORDER — SODIUM CHLORIDE 0.9 % IV SOLN
0.9 % | INTRAVENOUS | Status: DC | PRN
Start: 2023-01-14 — End: 2023-01-20

## 2023-01-14 MED ORDER — NORMAL SALINE FLUSH 0.9 % IV SOLN
0.9 % | INTRAVENOUS | Status: DC | PRN
Start: 2023-01-14 — End: 2023-01-20

## 2023-01-14 MED ORDER — NORMAL SALINE FLUSH 0.9 % IV SOLN
0.9 % | Freq: Two times a day (BID) | INTRAVENOUS | Status: DC
Start: 2023-01-14 — End: 2023-01-20
  Administered 2023-01-15 – 2023-01-20 (×11): 10 mL via INTRAVENOUS

## 2023-01-14 MED ORDER — HYDRALAZINE HCL 20 MG/ML IJ SOLN
20 MG/ML | Freq: Three times a day (TID) | INTRAMUSCULAR | Status: DC | PRN
Start: 2023-01-14 — End: 2023-01-20
  Administered 2023-01-15 (×2): 10 mg via INTRAVENOUS

## 2023-01-14 MED ORDER — INSULIN NPH ISOPHANE & REGULAR (70-30) 100 UNIT/ML SC SUPN
Freq: Two times a day (BID) | SUBCUTANEOUS | Status: DC
Start: 2023-01-14 — End: 2023-01-14

## 2023-01-14 MED ORDER — POLYETHYLENE GLYCOL 3350 17 G PO PACK
17 g | Freq: Every day | ORAL | Status: DC | PRN
Start: 2023-01-14 — End: 2023-01-20

## 2023-01-14 MED ORDER — INSULIN GLARGINE 100 UNIT/ML SC SOLN
100 UNIT/ML | Freq: Every day | SUBCUTANEOUS | Status: DC
Start: 2023-01-14 — End: 2023-01-20
  Administered 2023-01-15 – 2023-01-16 (×2): 30 [IU] via SUBCUTANEOUS

## 2023-01-14 MED ORDER — LACTATED RINGERS IV SOLN
INTRAVENOUS | Status: AC
Start: 2023-01-14 — End: 2023-01-16
  Administered 2023-01-14 – 2023-01-16 (×2): via INTRAVENOUS

## 2023-01-14 MED ORDER — POTASSIUM CHLORIDE 10 MEQ/100ML IV SOLN
10100 MEQ/0ML | INTRAVENOUS | Status: DC | PRN
Start: 2023-01-14 — End: 2023-01-20

## 2023-01-14 MED ORDER — MORPHINE SULFATE (PF) 4 MG/ML IJ SOLN
4 | INTRAMUSCULAR | Status: AC
Start: 2023-01-14 — End: 2023-01-14
  Administered 2023-01-14: 10:00:00 4 mg via INTRAVENOUS

## 2023-01-14 MED ORDER — CEFTRIAXONE SODIUM 1 G IJ SOLR
1 g | INTRAMUSCULAR | Status: DC
Start: 2023-01-14 — End: 2023-01-16
  Administered 2023-01-15 – 2023-01-16 (×2): 1000 mg via INTRAVENOUS

## 2023-01-14 MED ORDER — ACETAMINOPHEN 325 MG PO TABS
325 MG | Freq: Four times a day (QID) | ORAL | Status: DC | PRN
Start: 2023-01-14 — End: 2023-01-20
  Administered 2023-01-15 – 2023-01-19 (×6): 650 mg via ORAL

## 2023-01-14 MED ORDER — ONDANSETRON HCL 4 MG/2ML IJ SOLN
4 | Freq: Once | INTRAMUSCULAR | Status: AC
Start: 2023-01-14 — End: 2023-01-14
  Administered 2023-01-14: 07:00:00 4 mg via INTRAVENOUS

## 2023-01-14 MED ORDER — AMLODIPINE BESYLATE 5 MG PO TABS
5 MG | Freq: Every day | ORAL | Status: DC
Start: 2023-01-14 — End: 2023-01-20
  Administered 2023-01-14 – 2023-01-19 (×6): 10 mg via ORAL

## 2023-01-14 MED ORDER — INSULIN LISPRO 100 UNIT/ML IJ SOLN
100 UNIT/ML | Freq: Every evening | INTRAMUSCULAR | Status: DC
Start: 2023-01-14 — End: 2023-01-20

## 2023-01-14 MED ORDER — INSULIN LISPRO 100 UNIT/ML IJ SOLN
100 UNIT/ML | Freq: Three times a day (TID) | INTRAMUSCULAR | Status: DC
Start: 2023-01-14 — End: 2023-01-20
  Administered 2023-01-14 – 2023-01-15 (×3): 2 [IU] via SUBCUTANEOUS
  Administered 2023-01-19: 21:00:00 4 [IU] via SUBCUTANEOUS

## 2023-01-14 MED ORDER — PROCHLORPERAZINE MALEATE 5 MG PO TABS
5 MG | Freq: Four times a day (QID) | ORAL | Status: DC | PRN
Start: 2023-01-14 — End: 2023-01-20
  Administered 2023-01-15 (×2): 5 mg via ORAL

## 2023-01-14 MED FILL — ONDANSETRON HCL 4 MG/2ML IJ SOLN: 4 MG/2ML | INTRAMUSCULAR | Qty: 2

## 2023-01-14 MED FILL — LACTATED RINGERS IV SOLN: INTRAVENOUS | Qty: 1000

## 2023-01-14 MED FILL — ISOVUE-370 76 % IV SOLN: 76 % | INTRAVENOUS | Qty: 100

## 2023-01-14 MED FILL — MORPHINE SULFATE 2 MG/ML IJ SOLN: 2 mg/mL | INTRAMUSCULAR | Qty: 1

## 2023-01-14 MED FILL — PANTOPRAZOLE SODIUM 40 MG IV SOLR: 40 MG | INTRAVENOUS | Qty: 40

## 2023-01-14 MED FILL — METOPROLOL SUCCINATE ER 25 MG PO TB24: 25 MG | ORAL | Qty: 1

## 2023-01-14 MED FILL — ATORVASTATIN CALCIUM 20 MG PO TABS: 20 MG | ORAL | Qty: 1

## 2023-01-14 MED FILL — INSULIN LISPRO 100 UNIT/ML IJ SOLN: 100 UNIT/ML | INTRAMUSCULAR | Qty: 2

## 2023-01-14 MED FILL — MORPHINE SULFATE 4 MG/ML IJ SOLN: 4 mg/mL | INTRAMUSCULAR | Qty: 1

## 2023-01-14 MED FILL — CEFTRIAXONE SODIUM 1 G IJ SOLR: 1 g | INTRAMUSCULAR | Qty: 1000

## 2023-01-14 MED FILL — BD POSIFLUSH 0.9 % IV SOLN: 0.9 % | INTRAVENOUS | Qty: 40

## 2023-01-14 MED FILL — HYDRALAZINE HCL 20 MG/ML IJ SOLN: 20 MG/ML | INTRAMUSCULAR | Qty: 1

## 2023-01-14 MED FILL — LANTUS 100 UNIT/ML SC SOLN: 100 UNIT/ML | SUBCUTANEOUS | Qty: 17

## 2023-01-14 MED FILL — FENOFIBRATE 160 MG PO TABS: 160 MG | ORAL | Qty: 1

## 2023-01-14 MED FILL — ENOXAPARIN SODIUM 40 MG/0.4ML IJ SOSY: 40 MG/0.4ML | INTRAMUSCULAR | Qty: 0.4

## 2023-01-14 MED FILL — PROCHLORPERAZINE MALEATE 5 MG PO TABS: 5 MG | ORAL | Qty: 1

## 2023-01-14 MED FILL — AMLODIPINE BESYLATE 5 MG PO TABS: 5 MG | ORAL | Qty: 2

## 2023-01-14 NOTE — H&P (Signed)
[facility]  [address] [place], [place]  [address]  [phone]    Hospital Medicine Admission History and Physical      NAME:  Melinda [name]   DOB:   Dec 23, 1980   MRN:  [id number]     PCP:  No primary care provider on file.     Date of service:  01/14/2023         Subjective:     CHIEF COMPLAINT: Abdominal pain and back pain    HISTORY OF PRESENT ILLNESS:     Melinda [name] is a 42 y.o.  Hispanic female who is admitted with sepsis.  Melinda [name] past medical history of DM, HTN, hyperlipidemia presented to ER complaining of abdominal pain and back pain, which started about 2 weeks ago but recently it became progressively worse and had to come to the emergency room.  Patient denies fever, no diarrhea.  he abdominal pain is sharp, moderate to severe intensity associated with vomiting.  Also, complaining of bilateral back pain      Past Medical History:   Diagnosis Date    High blood pressure     High cholesterol     Type 2 diabetes mellitus (HCC)     Controled        Past Surgical History:   Procedure Laterality Date    OTHER SURGICAL HISTORY Left     Rentia attachment       Social History     Tobacco Use    Smoking status: Never    Smokeless tobacco: Never   Substance Use Topics    Alcohol use: Never        No family history on file.     No Known Allergies     Prior to Admission medications    Medication Sig Start Date End Date Taking? Authorizing Provider   metoprolol succinate (TOPROL XL) 25 MG extended release tablet Take 1 tablet by mouth daily 06/29/22   Cecille Rubin B, PA-C   amLODIPine (NORVASC) 5 MG tablet Take 2 tablets by mouth daily 06/29/22 08/28/22  Sherrie Mustache, Cammy Copa B, PA-C   hydroCHLOROthiazide (HYDRODIURIL) 25 MG tablet Take 1 tablet by mouth every morning 06/29/22   Cecille Rubin B, PA-C   simvastatin (ZOCOR) 20 MG tablet Take 1 tablet by mouth nightly 06/29/22 08/28/22  Fisher, Cammy Copa B, PA-C   naproxen (NAPROSYN) 500 MG tablet Take 1 tablet by mouth  2 times daily (with meals) 05/15/22   Kathrene Alu, MD   silver sulfADIAZINE (SILVADENE) 1 % cream Apply topically twice daily. 05/15/22   Kathrene Alu, MD   amLODIPine (NORVASC) 5 MG tablet Take 2 tablets by mouth daily 02/16/22   Marcell Anger, MD   fenofibrate (TRIGLIDE) 160 MG tablet Take 1 tablet by mouth daily 06/22/21   Automatic Reconciliation, Ar   ferrous sulfate (IRON 325) 325 (65 Fe) MG tablet Take by mouth every morning (before breakfast)  Patient not taking: Reported on 02/16/2022    Automatic Reconciliation, Ar   hydroCHLOROthiazide (HYDRODIURIL) 25 MG tablet Take 1 tablet by mouth daily 04/20/21   Automatic Reconciliation, Ar   insulin 70-30 (NOVOLIN 70/30) (70-30) 100 UNIT per ML injection vial Inject 15 Units into the skin 2 times daily 07/10/21   Automatic Reconciliation, Ar   lisinopril (PRINIVIL;ZESTRIL) 10 MG tablet Take 2 tablets by mouth daily    Automatic Reconciliation, Ar   metoprolol succinate (TOPROL XL) 25 MG extended release tablet Take 1 tablet  by mouth daily 09/07/21   Automatic Reconciliation, Ar   simvastatin (ZOCOR) 20 MG tablet Take 1 tablet by mouth nightly  Patient not taking: Reported on 02/16/2022 09/07/21   Automatic Reconciliation, Ar         Review of Systems:  (bold if positive, if negative)    Gen:  Eyes:  ENT:  CVS:  Pulm:  GI:  Abdominal pain, nausea, emesisGU:  MS:  Skin:  Psych:  Endo:  Hem:  Renal:  Neuro:            Objective:      VITALS:    Vital signs reviewed; most recent are:    Vitals:    01/14/23 0300   BP: (!) 193/98   Pulse: (!) 102   Resp: 15   Temp: 98.4 F (36.9 C)   SpO2: 100%     SpO2 Readings from Last 6 Encounters:   01/14/23 100%   06/29/22 100%   05/15/22 98%   02/16/22 99%        No intake or output data in the 24 hours ending 01/14/23 0540         Exam:     Physical Exam:    Gen:  Well-developed, well-nourished, in no acute distress  HEENT:  Pink conjunctivae, PERRL, hearing intact to voice, moist mucous membranes  Neck:  Supple, without masses,  thyroid non-tender  Resp:  No accessory muscle use, clear breath sounds without wheezes rales or rhonchi  Card:  No murmurs, normal S1, S2 without thrills, bruits or peripheral edema  Abd:  Soft, non-tender, non-distended, normoactive bowel sounds are present, no palpable organomegaly and no detectable hernias  Lymph:  No cervical or inguinal adenopathy  Musc:  No cyanosis or clubbing  Skin:  No rashes or ulcers, skin turgor is good  Neuro:  Cranial nerves are grossly intact, no focal motor weakness, follows commands appropriately  Psych:  Good insight, oriented to person, place and time, alert       Labs:    Recent Labs     01/14/23  0248   WBC 12.2*   HGB 7.6*   HCT 24.7*   PLT 477*     Recent Labs     01/13/23  2111   NA 137   K 4.2   CL 109*   CO2 24   BUN 28*   ALT 22     No results found for: "GLUCPOC"  No results for input(s): "PH", "PCO2", "PO2", "HCO3", "FIO2" in the last 72 hours.  No results for input(s): "INR" in the last 72 hours.    Telemetry reviewed:    Sinus tachycardia       Assessment/Plan:    Sepsis (HCC): Leukocytosis, Elevated lactic acid level, Tachycardia.  Not severe sepsis.  Check blood and urine cultures.  Continue antibiotics    2.? UTI (urinary tract infection). UA: Many epithelial cells.  Possible contamination.  For now continue Ceftriaxone until final culture     3.  Abdominal pain.  Exam is benign.  CT of the abdomen and chest without acute finding.  Pain management with morphine IV      4.  AKI (acute kidney injury) (HCC).  Likely due to volume depletion.  Hold lisinopril and hydrochlorothiazide.  Continue IV      5.  DM (diabetes mellitus) (HCC).  Continue home insulin and cover with sliding      6.  HTN (hypertension).  Holding hydrochlorothiazide and lisinopril due to AKI.  Continue amlodipine and metoprolol      7.  Hyperlipidemia.  On statin       Previous medical records reviewed     Risk of deterioration: high      Total time spent with patient care Total time: 75 minutes. I  personally saw and examined the patient during this time period.  Greater than 50% of this time was spent in counseling and coordination of care. minutes    I personally reviewed chart, notes, data and current medications in the medical record.  I have personally examined and treated the patient at bedside during this period.                 Care Plan discussed with: Patient, Nursing Staff, and >50% of time spent in counseling and coordination of care    Discussed:  Care Plan    Prophylaxis:  Lovenox    Probable Disposition:  Home w/Family           ___________________________________________________    Attending Physician: Otho DarnerMesfin Damein Gaunce, MD

## 2023-01-14 NOTE — ED Notes (Signed)
TRANSFER - OUT REPORT:    Verbal report given to [facility], RN on Melinda [name]  being transferred to Rm. [id number] for routine progression of patient care       Report consisted of patient's Situation, Background, Assessment and   Recommendations(SBAR).     Information from the following report(s) ED Encounter Summary, ED SBAR, MAR, Recent Results, and Med Rec Status was reviewed with the receiving nurse.    Kinder Fall Assessment:    Presents to emergency department  because of falls (Syncope, seizure, or loss of consciousness): No  Age > 70: No  Altered Mental Status, Intoxication with alcohol or substance confusion (Disorientation, impaired judgment, poor safety awaremess, or inability to follow instructions): No  Impaired Mobility: Ambulates or transfers with assistive devices or assistance; Unable to ambulate or transer.: No             Lines:   Peripheral IV 01/13/23 Left;Anterior Forearm (Active)        Opportunity for questions and clarification was provided.      Patient transported with:  [employer]

## 2023-01-14 NOTE — ED Notes (Signed)
Report given to Jordan,RN

## 2023-01-14 NOTE — ED Notes (Signed)
PT POC pregnancy test is negative

## 2023-01-14 NOTE — Care Coordination-Inpatient (Signed)
Initial Case Management Assessment           01/14/23 1105   Service Assessment   Patient Orientation Alert and Oriented   Cognition Alert   History Provided By Patient;Child/Family   Primary Caregiver Self   Accompanied By/Relationship daughter Melinda [name]   Support Systems Spouse/Significant Other;Children   Patient's Healthcare Decision Maker is: Armed forces operational officer Next of Kin  (husband Melinda [name] [phone])   PCP Verified by CM Yes  (Dr. Katrinka Blazing)   Last Visit to PCP Within last 3 months   Prior Functional Level Independent in ADLs/IADLs   Can patient return to prior living arrangement Yes   Ability to make needs known: Good   Family able to assist with home care needs: Yes   Financial Resources Other (Comment)  (pt applied for medicaid, care fund info given)   Programmer, applications None   Social/Functional History   Lives With Spouse;Daughter   Type of Home Trailer   Home Layout One level   Home Access Stairs to enter with rails   Entrance Stairs - Number of Steps 5   Bathroom Equipment None   Home Equipment None   ADL Assistance Independent   Mudlogger Yes   Ambulation Assistance Independent   Transfer Assistance Independent   Active Driver No   Patient's Driver Info daughter [place]   Discharge Planning   Type of Residence Trailer/Mobile Home   Living Arrangements Spouse/Significant Other;Children   Current Services Prior To Admission None   Potential Assistance Needed N/A   DME Ordered? No   Potential Assistance Purchasing Medications No   Type of Home Care Services None   Patient expects to be discharged to: Trailer/mobile home   One/Two Story Residence One story   Services At/After Discharge   [employer] Information Provided? No   Mode of Transport at Discharge Other (see comment)  (daughter daisy)         Patient was admitted on 01/14/23 for sepsis. This CM met with patient and patient's daughter Melinda [name] at bedside, introduced self, explained role, and confirmed  demographic information on face sheet. Patient lives at home with husband and daughter. Pt is independent with all ADL's and daughter drives. Pt does not have DME and has no Hx of HH/IPR/SNF.       AD: husband Melinda [name] [phone]  PCP: Dr. Katrinka Blazing at [facility]  Emergency Contact: daughter Melinda [name] [phone]  DC ride: daughter Melinda [name]         Patient plans to dc home with family once medically stable. Patient's daughter states patient has applied for medicaid but is unsure of a T#. CM has provided patient with a spanish care fund application and a list of [employer] PCP. CM will continue to follow.             ___________________________________________   Grier Rocher, RN Case Manager  01/14/2023   11:27 AM

## 2023-01-14 NOTE — Progress Notes (Addendum)
1525: got report from [place] RN in ER at this time.     1704: messaged provider advised pt BP when got here 193/98 pt had just moved into bed and finished vomiting at that time so I rechecked. Pt BP now 177/95 no prns. Order received for PRN Hydralazine.     1753: asked provider if diet order of clear liquids was suppose to start at midnight or now he advised now is goo . Asked if he wanted it only until midnight or going forward he advised Clears til tomorrow.     1915: Bedside and Verbal shift change report given to Amy RN (oncoming nurse) by Baird Lyons RN (offgoing nurse). Report included the following information Adult Overview, Recent Results, Med Rec Status, and Cardiac Rhythm SR to STach .

## 2023-01-14 NOTE — Progress Notes (Signed)
Admitted earlier today for presumed sepsis    Her main issue is abdominal pain (Epigastric area) with vomiting denies any blood. She has mild tenderness in epigastric area. She has heart burn and chronic microcytic anemia denies NSAID use tells me when she takes Omeprazole it helps     Start IV PPI  Consult GI ? EGD  Check stool OB  Hold Lisinopril due to elevated Creatinine  She is Diabetic at home takes 70/30 Insulin 38 units am and 30 Units Pm.

## 2023-01-14 NOTE — Consults (Signed)
Session ID: [id number]  Language: Spanish  Interpreter ID: #[id number]  Interpreter Name: Aniceto Boss

## 2023-01-14 NOTE — Consults (Signed)
Bedie Dominey, PA-C                       [phone] office             Monday-Friday 8:00 am-4:30 pm  I am not permitted to use "perfect serve" use above for contact, thanks.      Gastroenterology Consultation Note      Admit Date: 01/13/2023  Consult Date: 01/14/2023   I greatly appreciate your asking me to see Melinda [name], thank you very much for the opportunity to participate in her care.    Narrative Assessment and Plan   41YOF with DM, HTN, and HLD presenting with abd pain nausea and vomiting progressively getting worse over the past month. She now cannot keep any food or drinks down. CTA of the abdomen was done yesterday, reviewed with Dr. Brent Bulla, and reveals no ischemia to the bowels, however there is some edema present in the duodenum. [place] with CBD measuring 2 mm, gallbladder normal, liver diffusely increased in echogenicity. Lipase 46, LFTs within normal limits, wbc elevated at 12.2. Septic on presentation thought to be from UTI, started on ceftriaxone.     Impression:  Abd pain/N/V  Duodenal inflammation on CTA  UTI  AKI  diabetes  HTN  Hyperlipidemia     Plan:  Plan for EGD on Monday, we will determine the timing. Keep NPO past midnight Sunday night.   Continue with IV fluid repletion, advance diet as patient is able to tolerate.   Pantoprazole increased to 40 mg IV BID.     [name], PA-C    Subjective:     Chief Complaint: abd pain, nausea, vomiting     History of Present Illness: Melinda [name] is a 42 year old female with past medical history of DM, HTN, and hyperlipidemia presenting to the ED with epigastric/LUQ abdominal pain that radiates to her back associated with nausea and vomiting. It started intermittently about a month ago and has progressively gotten worse. At first she had pain when she ate food and vomited acidic liquid every morning on waking up. She was taking omeprazole PRN with some relief.  Now, her pain is getting worse and she vomits every time she eats anything. No hematemesis. No diarrhea. She had two days of black stools about 2 weeks ago and her stool has been normal brown color since then.     CT Result (most recent):  CTA CHEST ABDOMEN PELVIS W CONTRAST 01/14/2023    Narrative  INDICATION: PE protocol, also having epigastric pain Reason for exam:->PE  protocol, also having epigastric pain    COMPARISON: June 29, 2022    TECHNIQUE:   After the rapid bolus administration of IV contrast, then thin  section helical images were obtained through the chest, abdomen, and pelvis.  3D  image postprocessing was performed.    CT dose reduction was achieved through the use of a standardized protocol  tailored for this examination and automatic exposure control for dose  modulation.    FINDINGS:    THYROID: Unremarkable.  MEDIASTINUM/HILA: No mass or lymphadenopathy.  HEART/PERICARDIUM: Unremarkable.Coronary artery calcification:  1 absent.  LUNGS/PLEURA: No nodule, mass, or airspace disease.  BONES: No destructive bone lesion.  ADDITIONAL COMMENTS: No visualized pulmonary embolus.    AORTA: No evidence of aortic aneurysm. Atherosclerosis results in moderate  narrowing at the infrarenal segment but unchanged. There is no acute or chronic  dissection. Mild calcific atherosclerosis,.  LIVER: No mass or biliary dilatation.  GALLBLADDER: Unremarkable.  SPLEEN: No enlargement or lesion.  PANCREAS: No mass or ductal dilatation.  ADRENALS: No mass.  KIDNEYS: No mass, calculus, or hydronephrosis.  GI TRACT:  No bowel obstruction or wall thickening  PERITONEUM: No free air or free fluid.  APPENDIX: Unremarkable.Marland Kitchen.  RETROPERITONEUM: No lymphadenopathy.  ADDITIONAL COMMENTS: N/A.    URINARY BLADDER: No mass or calculus.  LYMPH NODES: None enlarged.  REPRODUCTIVE ORGANS: Unremarkable.  FREE FLUID: None.  BONES: No destructive bone lesion.  ADDITIONAL COMMENTS: N/A.    Impression  No acute aortic dissection. No  aneurysm. No acute pulmonary embolus.    US Result (most recent):  US ABDOMEN LIMITED 01/13/2023    Narrative  INDICATION: Right upper quadrant pain    COMPARISON:  None    FINDINGS: Limited right upper quadrant ultrasound was performed. The liver is  diffusely increased in echogenicity. The common bile duct is normal measuring 2  mm in diameter. The gallbladder is normal. The right kidney measures 10.9 cm in  length. There is no hydronephrosis or visible ascites.    Impression  No acute abnormality.        PCP:  No primary care provider on file.    Past Medical History:   Diagnosis Date    High blood pressure     High cholesterol     Type 2 diabetes mellitus (HCC)     Controled        Past Surgical History:   Procedure Laterality Date    OTHER SURGICAL HISTORY Left     Rentia attachment       Social History     Tobacco Use    Smoking status: Never    Smokeless tobacco: Never   Substance Use Topics    Alcohol use: Never        No family history on file.     No Known Allergies         Home Medications:  Prior to Admission Medications   Prescriptions Last Dose Informant Patient Reported? Taking?   amLODIPine (NORVASC) 5 MG tablet   No No   Sig: Take 2 tablets by mouth daily   amLODIPine (NORVASC) 5 MG tablet   No No   Sig: Take 2 tablets by mouth daily   fenofibrate (TRIGLIDE) 160 MG tablet   Yes No   Sig: Take 1 tablet by mouth daily   ferrous sulfate (IRON 325) 325 (65 Fe) MG tablet   Yes No   Sig: Take by mouth every morning (before breakfast)   Patient not taking: Reported on 02/16/2022   hydroCHLOROthiazide (HYDRODIURIL) 25 MG tablet   Yes No   Sig: Take 1 tablet by mouth daily   hydroCHLOROthiazide (HYDRODIURIL) 25 MG tablet   No No   Sig: Take 1 tablet by mouth every morning   insulin 70-30 (NOVOLIN 70/30) (70-30) 100 UNIT per ML injection vial   Yes No   Sig: Inject 15 Units into the skin 2 times daily   lisinopril (PRINIVIL;ZESTRIL) 10 MG tablet   Yes No   Sig: Take 2 tablets by mouth daily   metoprolol  succinate (TOPROL XL) 25 MG extended release tablet   Yes No   Sig: Take 1 tablet by mouth daily   metoprolol succinate (TOPROL XL) 25 MG extended release tablet   No No   Sig: Take 1 tablet by mouth daily   naproxen (NAPROSYN) 500 MG tablet   No No  Sig: Take 1 tablet by mouth 2 times daily (with meals)   silver sulfADIAZINE (SILVADENE) 1 % cream   No No   Sig: Apply topically twice daily.   simvastatin (ZOCOR) 20 MG tablet   Yes No   Sig: Take 1 tablet by mouth nightly   Patient not taking: Reported on 02/16/2022   simvastatin (ZOCOR) 20 MG tablet   No No   Sig: Take 1 tablet by mouth nightly      Facility-Administered Medications: None       Hospital Medications:  Current Facility-Administered Medications   Medication Dose Route Frequency    amLODIPine (NORVASC) tablet 10 mg  10 mg Oral Daily    fenofibrate (TRIGLIDE) tablet 160 mg  160 mg Oral Daily    metoprolol succinate (TOPROL XL) extended release tablet 25 mg  25 mg Oral Daily    atorvastatin (LIPITOR) tablet 10 mg  10 mg Oral Daily    sodium chloride flush 0.9 % injection 5-40 mL  5-40 mL IntraVENous 2 times per day    sodium chloride flush 0.9 % injection 5-40 mL  5-40 mL IntraVENous PRN    0.9 % sodium chloride infusion   IntraVENous PRN    potassium chloride (KLOR-CON) extended release tablet 40 mEq  40 mEq Oral PRN    Or    potassium bicarb-citric acid (EFFER-K) effervescent tablet 40 mEq  40 mEq Oral PRN    Or    potassium chloride 10 mEq/100 mL IVPB (Peripheral Line)  10 mEq IntraVENous PRN    magnesium sulfate 2000 mg in 50 mL IVPB premix  2,000 mg IntraVENous PRN    enoxaparin (LOVENOX) injection 40 mg  40 mg SubCUTAneous Daily    ondansetron (ZOFRAN-ODT) disintegrating tablet 4 mg  4 mg Oral Q8H PRN    Or    ondansetron (ZOFRAN) injection 4 mg  4 mg IntraVENous Q6H PRN    polyethylene glycol (GLYCOLAX) packet 17 g  17 g Oral Daily PRN    acetaminophen (TYLENOL) tablet 650 mg  650 mg Oral Q6H PRN    Or    acetaminophen (TYLENOL) suppository 650 mg   650 mg Rectal Q6H PRN    lactated ringers IV soln infusion   IntraVENous Continuous    [START ON 01/15/2023] cefTRIAXone (ROCEPHIN) 1,000 mg in sterile water 10 mL IV syringe  1,000 mg IntraVENous Q24H    insulin lispro (HUMALOG) injection vial 0-8 Units  0-8 Units SubCUTAneous TID WC    insulin lispro (HUMALOG) injection vial 0-4 Units  0-4 Units SubCUTAneous Nightly    morphine (PF) injection 2 mg  2 mg IntraVENous Q4H PRN    oxyCODONE (ROXICODONE) immediate release tablet 5 mg  5 mg Oral Q4H PRN    [START ON 01/15/2023] insulin glargine (LANTUS) injection vial 30 Units  30 Units SubCUTAneous Daily    ondansetron (ZOFRAN) injection 4 mg  4 mg IntraVENous q8h    prochlorperazine (COMPAZINE) tablet 5 mg  5 mg Oral Q6H PRN    pantoprazole (PROTONIX) 40 mg in sodium chloride (PF) 0.9 % 10 mL injection  40 mg IntraVENous Q12H       Review of Systems: Admission ROS by Mesfin Esmeralda Links, MD from 01/13/2023 were reviewed with the patient and changes (other than per HPI) include: none      Objective:     Physical Exam:  Vitals:    01/14/23 1158   BP:    Pulse:    Resp: 16   Temp:  SpO2:      SpO2 Readings from Last 6 Encounters:   01/14/23 100%   06/29/22 100%   05/15/22 98%   02/16/22 99%        No intake or output data in the 24 hours ending 01/14/23 1617   General: no distress, comfortable  Skin:  No rash or palpable dermatologic mass lesions  HEENT: Pupils equal, sclera anicteric, oropharynx with no gross lesions  Cardiovascular: No abnormal audible heart sounds, well perfused, no edema  Respiratory:  No abnormal audible breath sounds, normal respiratory effort, no throacic deformity  GI:  Abdomen nondistended, +epigastric and RUQ tenderness, no mass, no free fluid, no rebound or guarding.  Musculoskeletal:  No skeletal deformity nor acute arthritis noted.  Neurological:  Motor and sensory function intact in upper extremeties  Psychiatric:  Normal affect, memory intact, appears to have insight into current  illness    Laboratory:    Recent Results (from the past 24 hour(s))   CBC with Auto Differential    Collection Time: 01/13/23  9:11 PM   Result Value Ref Range    WBC 11.6 (H) 3.6 - 11.0 K/uL    RBC 3.33 (L) 3.80 - 5.20 M/uL    Hemoglobin 7.7 (L) 11.5 - 16.0 g/dL    Hematocrit 16.124.3 (L) 35.0 - 47.0 %    MCV 73.0 (L) 80.0 - 99.0 FL    MCH 23.1 (L) 26.0 - 34.0 PG    MCHC 31.7 30.0 - 36.5 g/dL    RDW 09.616.6 (H) 04.511.5 - 14.5 %    Platelets 507 (H) 150 - 400 K/uL    MPV 9.3 8.9 - 12.9 FL    Nucleated RBCs 0.0 0 PER 100 WBC    nRBC 0.00 0.00 - 0.01 K/uL    Neutrophils % 65 32 - 75 %    Lymphocytes % 24 12 - 49 %    Monocytes % 5 5 - 13 %    Eosinophils % 5 0 - 7 %    Basophils % 1 0 - 1 %    Immature Granulocytes % 0 0.0 - 0.5 %    Neutrophils Absolute 7.6 1.8 - 8.0 K/UL    Lymphocytes Absolute 2.8 0.8 - 3.5 K/UL    Monocytes Absolute 0.6 0.0 - 1.0 K/UL    Eosinophils Absolute 0.5 (H) 0.0 - 0.4 K/UL    Basophils Absolute 0.1 0.0 - 0.1 K/UL    Immature Granulocytes Absolute 0.0 0.00 - 0.04 K/UL    Differential Type AUTOMATED     Comprehensive Metabolic Panel    Collection Time: 01/13/23  9:11 PM   Result Value Ref Range    Sodium 137 136 - 145 mmol/L    Potassium 4.2 3.5 - 5.1 mmol/L    Chloride 109 (H) 97 - 108 mmol/L    CO2 24 21 - 32 mmol/L    Anion Gap 4 (L) 5 - 15 mmol/L    Glucose 209 (H) 65 - 100 mg/dL    BUN 28 (H) 6 - 20 MG/DL    Creatinine 4.091.45 (H) 0.55 - 1.02 MG/DL    Bun/Cre Ratio 19 12 - 20      Est, Glom Filt Rate 47 (L) >60 ml/min/1.3873m2    Calcium 8.4 (L) 8.5 - 10.1 MG/DL    Total Bilirubin 0.2 0.2 - 1.0 MG/DL    ALT 22 12 - 78 U/L    AST 17 15 - 37 U/L    Alk Phosphatase 116  45 - 117 U/L    Total Protein 6.8 6.4 - 8.2 g/dL    Albumin 2.6 (L) 3.5 - 5.0 g/dL    Globulin 4.2 (H) 2.0 - 4.0 g/dL    Albumin/Globulin Ratio 0.6 (L) 1.1 - 2.2     Lipase    Collection Time: 01/13/23  9:11 PM   Result Value Ref Range    Lipase 46 13 - 75 U/L   Troponin    Collection Time: 01/13/23  9:15 PM   Result Value Ref Range     Troponin, High Sensitivity 11 0 - 51 ng/L   D-Dimer, Quantitative    Collection Time: 01/13/23 10:23 PM   Result Value Ref Range    D-Dimer, Quant 0.66 (H) 0.00 - 0.65 mg/L FEU   POC Lactic Acid    Collection Time: 01/13/23 10:52 PM   Result Value Ref Range    POC Lactic Acid 2.63 (HH) 0.40 - 2.00 mmol/L   EKG 12 Lead    Collection Time: 01/13/23 10:58 PM   Result Value Ref Range    Ventricular Rate 110 BPM    Atrial Rate 110 BPM    P-R Interval 134 ms    QRS Duration 80 ms    Q-T Interval 348 ms    QTc Calculation (Bazett) 470 ms    P Axis 50 degrees    R Axis -52 degrees    T Axis 53 degrees    Diagnosis       Sinus tachycardia  Left axis deviation  Possible Anterior infarct , age undetermined  Abnormal ECG  When compared with ECG of 28-Jun-2022 22:13,  Borderline criteria for Inferior infarct are no longer present     Lactic Acid    Collection Time: 01/13/23 11:10 PM   Result Value Ref Range    Lactic Acid, Plasma <0.3 (L) 0.4 - 2.0 MMOL/L   Urinalysis with Microscopic    Collection Time: 01/13/23 11:35 PM   Result Value Ref Range    Color, UA ORANGE      Appearance CLOUDY (A) CLEAR      Specific Gravity, UA 1.016 1.003 - 1.030      pH, Urine 5.5 5.0 - 8.0      Protein, UA 300 (A) NEG mg/dL    Glucose, UA 478 (A) NEG mg/dL    Ketones, Urine Negative NEG mg/dL    Bilirubin Urine Negative NEG      Blood, Urine TRACE (A) NEG      Urobilinogen, Urine 0.2 0.2 - 1.0 EU/dL    Nitrite, Urine Negative NEG      Leukocyte Esterase, Urine SMALL (A) NEG      WBC, UA 5-10 0 - 4 /hpf    RBC, UA 0-5 0 - 5 /hpf    Epithelial Cells UA MANY (A) FEW /lpf    BACTERIA, URINE 4+ (A) NEG /hpf    Hyaline Casts, UA 10-20 0 - 5 /lpf   POC Pregnancy Urine Qual    Collection Time: 01/14/23 12:31 AM   Result Value Ref Range    Preg Test, Ur Negative NEG     CBC with Auto Differential    Collection Time: 01/14/23  2:48 AM   Result Value Ref Range    WBC 12.2 (H) 3.6 - 11.0 K/uL    RBC 3.34 (L) 3.80 - 5.20 M/uL    Hemoglobin 7.6 (L) 11.5 - 16.0  g/dL    Hematocrit 29.5 (L) 35.0 - 47.0 %  MCV 74.0 (L) 80.0 - 99.0 FL    MCH 22.8 (L) 26.0 - 34.0 PG    MCHC 30.8 30.0 - 36.5 g/dL    RDW 96.2 (H) 95.2 - 14.5 %    Platelets 477 (H) 150 - 400 K/uL    MPV 9.3 8.9 - 12.9 FL    Nucleated RBCs 0.0 0 PER 100 WBC    nRBC 0.00 0.00 - 0.01 K/uL    Neutrophils % 59 32 - 75 %    Lymphocytes % 29 12 - 49 %    Monocytes % 6 5 - 13 %    Eosinophils % 5 0 - 7 %    Basophils % 1 0 - 1 %    Immature Granulocytes % 0 0.0 - 0.5 %    Neutrophils Absolute 7.3 1.8 - 8.0 K/UL    Lymphocytes Absolute 3.5 0.8 - 3.5 K/UL    Monocytes Absolute 0.7 0.0 - 1.0 K/UL    Eosinophils Absolute 0.6 (H) 0.0 - 0.4 K/UL    Basophils Absolute 0.1 0.0 - 0.1 K/UL    Immature Granulocytes Absolute 0.0 0.00 - 0.04 K/UL    Differential Type AUTOMATED     POCT Glucose    Collection Time: 01/14/23  7:45 AM   Result Value Ref Range    POC Glucose 233 (H) 65 - 117 mg/dL    Performed by: RENNIE [place]    POCT Glucose    Collection Time: 01/14/23 11:32 AM   Result Value Ref Range    POC Glucose 228 (H) 65 - 117 mg/dL    Performed by: Coletta Memos    TYPE AND SCREEN    Collection Time: 01/14/23  1:31 PM   Result Value Ref Range    Crossmatch expiration date 01/17/2023,2359     ABO/Rh O POSITIVE     Antibody Screen NEG          Assessment/Plan:     Principal Problem:    Sepsis (HCC)  Active Problems:    Abdominal pain    UTI (urinary tract infection)    AKI (acute kidney injury) (HCC)    Leukocytosis    Elevated lactic acid level    Tachycardia    DM (diabetes mellitus) (HCC)    HTN (hypertension)    Hyperlipidemia  Resolved Problems:    * No resolved hospital problems. *       See above narrative for full detail.

## 2023-01-15 LAB — CBC WITH AUTO DIFFERENTIAL
Basophils %: 1 % (ref 0–1)
Basophils Absolute: 0.1 10*3/uL (ref 0.0–0.1)
Eosinophils %: 5 % (ref 0–7)
Eosinophils Absolute: 0.3 10*3/uL (ref 0.0–0.4)
Hematocrit: 21 % — ABNORMAL LOW (ref 35.0–47.0)
Hemoglobin: 6.5 g/dL — ABNORMAL LOW (ref 11.5–16.0)
Immature Granulocytes %: 0 % (ref 0.0–0.5)
Immature Granulocytes Absolute: 0 10*3/uL (ref 0.00–0.04)
Lymphocytes %: 24 % (ref 12–49)
Lymphocytes Absolute: 1.6 10*3/uL (ref 0.8–3.5)
MCH: 23 PG — ABNORMAL LOW (ref 26.0–34.0)
MCHC: 31 g/dL (ref 30.0–36.5)
MCV: 74.2 FL — ABNORMAL LOW (ref 80.0–99.0)
MPV: 9.3 FL (ref 8.9–12.9)
Monocytes %: 7 % (ref 5–13)
Monocytes Absolute: 0.5 10*3/uL (ref 0.0–1.0)
Neutrophils %: 63 % (ref 32–75)
Neutrophils Absolute: 4 10*3/uL (ref 1.8–8.0)
Nucleated RBCs: 0 PER 100 WBC
Platelets: 384 10*3/uL (ref 150–400)
RBC: 2.83 M/uL — ABNORMAL LOW (ref 3.80–5.20)
RDW: 16.8 % — ABNORMAL HIGH (ref 11.5–14.5)
WBC: 6.5 10*3/uL (ref 3.6–11.0)
nRBC: 0 10*3/uL (ref 0.00–0.01)

## 2023-01-15 LAB — COMPREHENSIVE METABOLIC PANEL
ALT: 11 U/L — ABNORMAL LOW (ref 12–78)
AST: 13 U/L — ABNORMAL LOW (ref 15–37)
Albumin/Globulin Ratio: 0.7 — ABNORMAL LOW (ref 1.1–2.2)
Albumin: 2.2 g/dL — ABNORMAL LOW (ref 3.5–5.0)
Alk Phosphatase: 87 U/L (ref 45–117)
Anion Gap: 4 mmol/L — ABNORMAL LOW (ref 5–15)
BUN/Creatinine Ratio: 13 (ref 12–20)
BUN: 17 MG/DL (ref 6–20)
CO2: 26 mmol/L (ref 21–32)
Calcium: 8 MG/DL — ABNORMAL LOW (ref 8.5–10.1)
Chloride: 107 mmol/L (ref 97–108)
Creatinine: 1.31 MG/DL — ABNORMAL HIGH (ref 0.55–1.02)
Est, Glom Filt Rate: 53 mL/min/{1.73_m2} — ABNORMAL LOW (ref >60)
Globulin: 3.3 g/dL (ref 2.0–4.0)
Glucose: 204 mg/dL — ABNORMAL HIGH (ref 65–100)
Potassium: 3.8 mmol/L (ref 3.5–5.1)
Sodium: 137 mmol/L (ref 136–145)
Total Bilirubin: 0.6 MG/DL (ref 0.2–1.0)
Total Protein: 5.5 g/dL — ABNORMAL LOW (ref 6.4–8.2)

## 2023-01-15 LAB — POCT GLUCOSE
POC Glucose: 274 mg/dL — ABNORMAL HIGH (ref 65–117)
POC Glucose: 216 mg/dL — ABNORMAL HIGH (ref 65–117)
POC Glucose: 186 mg/dL — ABNORMAL HIGH (ref 65–117)
POC Glucose: 118 mg/dL — ABNORMAL HIGH (ref 65–117)

## 2023-01-15 LAB — MAGNESIUM: Magnesium: 1.6 mg/dL (ref 1.6–2.4)

## 2023-01-15 LAB — PREPARE RBC (CROSSMATCH)

## 2023-01-15 MED ORDER — PROCHLORPERAZINE EDISYLATE 10 MG/2ML IJ SOLN
10 MG/2ML | Freq: Four times a day (QID) | INTRAMUSCULAR | Status: DC | PRN
Start: 2023-01-15 — End: 2023-01-20
  Administered 2023-01-15 – 2023-01-16 (×4): 10 mg via INTRAVENOUS

## 2023-01-15 MED ORDER — SODIUM CHLORIDE 0.9 % IV SOLN
0.9 | INTRAVENOUS | Status: DC | PRN
Start: 2023-01-15 — End: 2023-01-20

## 2023-01-15 MED FILL — ACETAMINOPHEN 325 MG PO TABS: 325 MG | ORAL | Qty: 2

## 2023-01-15 MED FILL — PROCHLORPERAZINE MALEATE 5 MG PO TABS: 5 MG | ORAL | Qty: 1

## 2023-01-15 MED FILL — ONDANSETRON HCL 4 MG/2ML IJ SOLN: 4 MG/2ML | INTRAMUSCULAR | Qty: 2

## 2023-01-15 MED FILL — PROCHLORPERAZINE EDISYLATE 10 MG/2ML IJ SOLN: 10 MG/2ML | INTRAMUSCULAR | Qty: 2

## 2023-01-15 MED FILL — PANTOPRAZOLE SODIUM 40 MG IV SOLR: 40 MG | INTRAVENOUS | Qty: 40

## 2023-01-15 MED FILL — OXYCODONE HCL 5 MG PO TABS: 5 MG | ORAL | Qty: 1

## 2023-01-15 MED FILL — ATORVASTATIN CALCIUM 10 MG PO TABS: 10 MG | ORAL | Qty: 1

## 2023-01-15 MED FILL — MORPHINE SULFATE 2 MG/ML IJ SOLN: 2 mg/mL | INTRAMUSCULAR | Qty: 1

## 2023-01-15 MED FILL — AMLODIPINE BESYLATE 5 MG PO TABS: 5 MG | ORAL | Qty: 2

## 2023-01-15 MED FILL — INSULIN LISPRO 100 UNIT/ML IJ SOLN: 100 UNIT/ML | INTRAMUSCULAR | Qty: 2

## 2023-01-15 MED FILL — METOPROLOL SUCCINATE ER 25 MG PO TB24: 25 MG | ORAL | Qty: 1

## 2023-01-15 MED FILL — LANTUS 100 UNIT/ML SC SOLN: 100 UNIT/ML | SUBCUTANEOUS | Qty: 30

## 2023-01-15 MED FILL — FENOFIBRATE 160 MG PO TABS: 160 MG | ORAL | Qty: 1

## 2023-01-15 MED FILL — HYDRALAZINE HCL 20 MG/ML IJ SOLN: 20 MG/ML | INTRAMUSCULAR | Qty: 1

## 2023-01-15 MED FILL — CEFTRIAXONE SODIUM 1 G IJ SOLR: 1 g | INTRAMUSCULAR | Qty: 1000

## 2023-01-15 NOTE — Progress Notes (Signed)
Hgb 6.5. MD notified 

## 2023-01-15 NOTE — Progress Notes (Signed)
[place] [facility]  [address] [place], [place] [address]  [phone]      Hospitalist Progress Note      NAME: Melinda [name]   DOB:  [birth year]  MRM:  [id number]    Date of service: 01/15/2023  12:32 PM       Assessment and Plan:   Abdominal pain, epigastric/melena.  Possible might have peptic ulcer.  Continue PPI IV twice daily.  Evaluated by GI and recommended EGD on Monday    2.  Macrocytic anemia.  Possible GI blood loss.  Patient stated that in the past, she noted melena.  Check stool for occult blood.    3.  SIRS: Leukocytosis, Elevated lactic acid level, Tachycardia.  Blood cultures remain negative.  Check urine cultures and continue antibiotics.       4.? UTI (urinary tract infection). UA: Many epithelial cells.  Possible contamination.  For now continue Ceftriaxone until final culture     5.  AKI (acute kidney injury) (HCC).  Likely due to volume depletion secondary to vomiting.  Hold lisinopril and hydrochlorothiazide.  Improving.  Continue IV      5.  DM (diabetes mellitus) (HCC).  Continue home insulin and cover with sliding      6.  HTN (hypertension).  Holding hydrochlorothiazide and lisinopril due to AKI.  Continue amlodipine and metoprolol      7.  Hyperlipidemia.  On statin         Subjective:     Chief Complaint:: Patient was seen and examined as a follow up for abdominal pain.  Chart was reviewed.  C/O abdominal pain and vomiting    ROS:  (bold if positive, if negative)    Tolerating PT  Tolerating Diet        Objective:     Last 24hrs VS reviewed since prior progress note. Most recent are:    Vitals:    01/15/23 1127   BP: 133/78   Pulse: (!) 102   Resp: 14   Temp: 98.6 F (37 C)   SpO2: 96%     SpO2 Readings from Last 6 Encounters:   01/15/23 96%   06/29/22 100%   05/15/22 98%   02/16/22 99%          Intake/Output Summary (Last 24 hours) at 01/15/2023 1232  Last data filed at 01/15/2023 0515  Gross per 24 hour   Intake 4335.6 ml   Output --   Net 4335.6 ml         Physical Exam:    Gen:  Well-developed, well-nourished, in no acute distress  HEENT:  Pink conjunctivae, PERRL, hearing intact to voice, moist mucous membranes  Neck:  Supple, without masses, thyroid non-tender  Resp:  No accessory muscle use, clear breath sounds without wheezes rales or rhonchi  Card:  No murmurs, normal S1, S2 without thrills, bruits or peripheral edema  Abd:  Soft, non-tender, non-distended, normoactive bowel sounds are present, no palpable organomegaly and no detectable hernias  Lymph:  No cervical or inguinal adenopathy  Musc:  No cyanosis or clubbing  Skin:  No rashes or ulcers, skin turgor is good  Neuro:  Cranial nerves are grossly intact, no focal motor weakness, follows commands appropriately  Psych:  Good insight, oriented to person, place and time, alert  __________________________________________________________________  Medications Reviewed: (see below)  Medications:     Current Facility-Administered Medications   Medication Dose Route Frequency    0.9 % sodium chloride infusion  IntraVENous PRN    prochlorperazine (COMPAZINE) injection 10 mg  10 mg IntraVENous Q6H PRN    amLODIPine (NORVASC) tablet 10 mg  10 mg Oral Daily    fenofibrate (TRIGLIDE) tablet 160 mg  160 mg Oral Daily    metoprolol succinate (TOPROL XL) extended release tablet 25 mg  25 mg Oral Daily    atorvastatin (LIPITOR) tablet 10 mg  10 mg Oral Daily    sodium chloride flush 0.9 % injection 5-40 mL  5-40 mL IntraVENous 2 times per day    sodium chloride flush 0.9 % injection 5-40 mL  5-40 mL IntraVENous PRN    0.9 % sodium chloride infusion   IntraVENous PRN    potassium chloride (KLOR-CON) extended release tablet 40 mEq  40 mEq Oral PRN    Or    potassium bicarb-citric acid (EFFER-K) effervescent tablet 40 mEq  40 mEq Oral PRN    Or    potassium chloride 10 mEq/100 mL IVPB (Peripheral Line)  10 mEq IntraVENous PRN    magnesium sulfate 2000 mg in 50 mL IVPB premix  2,000 mg IntraVENous PRN    ondansetron  (ZOFRAN-ODT) disintegrating tablet 4 mg  4 mg Oral Q8H PRN    Or    ondansetron (ZOFRAN) injection 4 mg  4 mg IntraVENous Q6H PRN    polyethylene glycol (GLYCOLAX) packet 17 g  17 g Oral Daily PRN    acetaminophen (TYLENOL) tablet 650 mg  650 mg Oral Q6H PRN    Or    acetaminophen (TYLENOL) suppository 650 mg  650 mg Rectal Q6H PRN    lactated ringers IV soln infusion   IntraVENous Continuous    cefTRIAXone (ROCEPHIN) 1,000 mg in sterile water 10 mL IV syringe  1,000 mg IntraVENous Q24H    insulin lispro (HUMALOG) injection vial 0-8 Units  0-8 Units SubCUTAneous TID WC    insulin lispro (HUMALOG) injection vial 0-4 Units  0-4 Units SubCUTAneous Nightly    morphine (PF) injection 2 mg  2 mg IntraVENous Q4H PRN    oxyCODONE (ROXICODONE) immediate release tablet 5 mg  5 mg Oral Q4H PRN    insulin glargine (LANTUS) injection vial 30 Units  30 Units SubCUTAneous Daily    ondansetron (ZOFRAN) injection 4 mg  4 mg IntraVENous q8h    prochlorperazine (COMPAZINE) tablet 5 mg  5 mg Oral Q6H PRN    pantoprazole (PROTONIX) 40 mg in sodium chloride (PF) 0.9 % 10 mL injection  40 mg IntraVENous Q12H    hydrALAZINE (APRESOLINE) injection 10 mg  10 mg IntraVENous Q8H PRN        Lab Data Reviewed: (see below)  Lab Review:     Recent Labs     01/13/23  2111 01/14/23  0248 01/15/23  0447   WBC 11.6* 12.2* 6.5   HGB 7.7* 7.6* 6.5*   HCT 24.3* 24.7* 21.0*   PLT 507* 477* 384     Recent Labs     01/13/23  2111 01/15/23  0447   NA 137 137   K 4.2 3.8   CL 109* 107   CO2 24 26   BUN 28* 17   MG  --  1.6   ALT 22 11*     No results found for: "GLUCPOC"  No results for input(s): "PH", "PCO2", "PO2", "HCO3", "FIO2" in the last 72 hours.  No results for input(s): "INR" in the last 72 hours.  @MICRORESULTS @    I have reviewed notes of prior 24hr.  Other pertinent lab:     Total time: -35- minutes. I personally saw and examined the patient during this time period.  Greater than 50% of this time was spent in counseling and coordination of  care.    I personally reviewed chart, notes, data and current medications in the medical record.  I have personally examined and treated the patient at bedside during this period.                 Care Plan discussed with: Patient, Family, Nursing Staff, and >50% of time spent in counseling and coordination of care    Discussed:  Care Plan    Prophylaxis:  SCD's    Disposition:  Home w/Family           ___________________________________________________    Attending Physician: Otho DarnerMesfin Rebekka Lobello, MD

## 2023-01-15 NOTE — Plan of Care (Signed)
Problem: Discharge Planning  Goal: Discharge to home or other facility with appropriate resources  Outcome: Progressing     Problem: Pain  Goal: Verbalizes/displays adequate comfort level or baseline comfort level  Outcome: Progressing     Problem: Skin/Tissue Integrity  Goal: Absence of new skin breakdown  Description: 1.  Monitor for areas of redness and/or skin breakdown  2.  Assess vascular access sites hourly  3.  Every 4-6 hours minimum:  Change oxygen saturation probe site  4.  Every 4-6 hours:  If on nasal continuous positive airway pressure, respiratory therapy assess nares and determine need for appliance change or resting period.  Outcome: Progressing     Problem: Safety - Adult  Goal: Free from fall injury  Outcome: Progressing

## 2023-01-15 NOTE — Other (Signed)
Informed Consent for Blood Component Transfusion Note    I have discussed with the patient the rationale for blood component transfusion; its benefits in treating or preventing fatigue, organ damage, or death; and its risk which includes mild transfusion reactions, rare risk of blood borne infection, or more serious but rare reactions. I have discussed the alternatives to transfusion, including the risk and consequences of not receiving transfusion. The patient had an opportunity to ask questions and had agreed to proceed with transfusion of blood components.    Electronically signed by Otho Darner, MD on 01/15/23 at 7:13 AM EDT

## 2023-01-16 LAB — TYPE AND SCREEN
ABO/Rh: O POS
Antibody Screen: NEGATIVE
Blood Bank Blood Product Expiration Date: 202404292359
Blood Bank ISBT Product Blood Type: 5100
Blood Bank Unit Type and Rh: O POS
Dispense Status Blood Bank: TRANSFUSED
Unit Divison: 0
Unit Issue Date/Time: 40620241647

## 2023-01-16 LAB — URINALYSIS WITH MICROSCOPIC
BACTERIA, URINE: NEGATIVE /hpf
Bilirubin Urine: NEGATIVE
Blood, Urine: NEGATIVE
Glucose, UA: 250 mg/dL — AB
Ketones, Urine: NEGATIVE mg/dL
Leukocyte Esterase, Urine: NEGATIVE
Nitrite, Urine: NEGATIVE
Protein, UA: 300 mg/dL — AB
Specific Gravity, UA: 1.012 (ref 1.003–1.030)
Urobilinogen, Urine: 0.2 EU/dL (ref 0.2–1.0)
pH, Urine: 6 (ref 5.0–8.0)

## 2023-01-16 LAB — CBC WITH AUTO DIFFERENTIAL
Basophils %: 1 % (ref 0–1)
Basophils Absolute: 0.1 10*3/uL (ref 0.0–0.1)
Eosinophils %: 5 % (ref 0–7)
Eosinophils Absolute: 0.3 10*3/uL (ref 0.0–0.4)
Hematocrit: 28.8 % — ABNORMAL LOW (ref 35.0–47.0)
Hemoglobin: 9.2 g/dL — ABNORMAL LOW (ref 11.5–16.0)
Immature Granulocytes %: 0 % (ref 0.0–0.5)
Immature Granulocytes Absolute: 0 10*3/uL (ref 0.00–0.04)
Lymphocytes %: 31 % (ref 12–49)
Lymphocytes Absolute: 2.2 10*3/uL (ref 0.8–3.5)
MCH: 24 PG — ABNORMAL LOW (ref 26.0–34.0)
MCHC: 31.9 g/dL (ref 30.0–36.5)
MCV: 75 FL — ABNORMAL LOW (ref 80.0–99.0)
MPV: 9 FL (ref 8.9–12.9)
Monocytes %: 10 % (ref 5–13)
Monocytes Absolute: 0.7 10*3/uL (ref 0.0–1.0)
Neutrophils %: 54 % (ref 32–75)
Neutrophils Absolute: 3.8 10*3/uL (ref 1.8–8.0)
Nucleated RBCs: 0 PER 100 WBC
Platelets: 423 10*3/uL — ABNORMAL HIGH (ref 150–400)
RBC: 3.84 M/uL (ref 3.80–5.20)
RDW: 16.5 % — ABNORMAL HIGH (ref 11.5–14.5)
WBC: 7 10*3/uL (ref 3.6–11.0)
nRBC: 0 10*3/uL (ref 0.00–0.01)

## 2023-01-16 LAB — BASIC METABOLIC PANEL
Anion Gap: 3 mmol/L — ABNORMAL LOW (ref 5–15)
BUN/Creatinine Ratio: 10 — ABNORMAL LOW (ref 12–20)
BUN: 14 MG/DL (ref 6–20)
CO2: 28 mmol/L (ref 21–32)
Calcium: 8.8 MG/DL (ref 8.5–10.1)
Chloride: 109 mmol/L — ABNORMAL HIGH (ref 97–108)
Creatinine: 1.38 MG/DL — ABNORMAL HIGH (ref 0.55–1.02)
Est, Glom Filt Rate: 50 mL/min/{1.73_m2} — ABNORMAL LOW (ref >60)
Glucose: 111 mg/dL — ABNORMAL HIGH (ref 65–100)
Potassium: 3.5 mmol/L (ref 3.5–5.1)
Sodium: 140 mmol/L (ref 136–145)

## 2023-01-16 LAB — POCT GLUCOSE
POC Glucose: 194 mg/dL — ABNORMAL HIGH (ref 65–117)
POC Glucose: 110 mg/dL (ref 65–117)
POC Glucose: 93 mg/dL (ref 65–117)
POC Glucose: 99 mg/dL (ref 65–117)

## 2023-01-16 LAB — CULTURE, URINE: Colony count: >100000

## 2023-01-16 MED FILL — PANTOPRAZOLE SODIUM 40 MG IV SOLR: 40 MG | INTRAVENOUS | Qty: 40

## 2023-01-16 MED FILL — ACETAMINOPHEN 325 MG PO TABS: 325 MG | ORAL | Qty: 2

## 2023-01-16 MED FILL — METOPROLOL SUCCINATE ER 25 MG PO TB24: 25 MG | ORAL | Qty: 1

## 2023-01-16 MED FILL — CEFTRIAXONE SODIUM 1 G IJ SOLR: 1 g | INTRAMUSCULAR | Qty: 1000

## 2023-01-16 MED FILL — PROCHLORPERAZINE EDISYLATE 10 MG/2ML IJ SOLN: 10 MG/2ML | INTRAMUSCULAR | Qty: 2

## 2023-01-16 MED FILL — OXYCODONE HCL 5 MG PO TABS: 5 MG | ORAL | Qty: 1

## 2023-01-16 MED FILL — AMLODIPINE BESYLATE 5 MG PO TABS: 5 MG | ORAL | Qty: 2

## 2023-01-16 MED FILL — FENOFIBRATE 160 MG PO TABS: 160 MG | ORAL | Qty: 1

## 2023-01-16 MED FILL — ONDANSETRON HCL 4 MG/2ML IJ SOLN: 4 MG/2ML | INTRAMUSCULAR | Qty: 2

## 2023-01-16 MED FILL — ATORVASTATIN CALCIUM 10 MG PO TABS: 10 MG | ORAL | Qty: 1

## 2023-01-16 MED FILL — LANTUS 100 UNIT/ML SC SOLN: 100 UNIT/ML | SUBCUTANEOUS | Qty: 30

## 2023-01-16 NOTE — Progress Notes (Signed)
Gastroenterology attending note    Assessment and plan  42 year old female with chronic NSAID use presenting with recently self-limited dark stools, acute blood loss anemia, profound iron deficiency, with improvement in epigastric tenderness resolution of nausea with start of high-dose PPI, tolerating clear liquid diet.  Renal sufficiency of uncertain cause with many hyaline casts on urinalysis.  CT abdomen pelvis with mild benign-appearing general thickening of gastric folds, inflammatory change in the duodenal sweep, and concern for circumferential lesion in the cecum.    Recommendation  Epigastric pain, melena, acute blood loss anemia with iron deficiency  - Continue Protonix 40 mg IV twice daily  - We will transition to a full liquid diet today  - Will perform diagnostic EGD tomorrow; please keep n.p.o. after midnight  -Excellent response to 1 unit of PRBC transfusions with increasing hemoglobin 6.5 increased to 9.2; transfusion parameters will be hemoglobin over 7 in the absence of active bleeding at the present time    Profound iron deficiency anemia, concern for circumferential thickening of the cecum on CT abdomen pelvis coronal reconstructed images  - Will start full liquid diet today  - Will repeat urinalysis; please check a.m. BMP; if renal function is improved patient would reasonably be a candidate for bowel prep Monday afternoon and Tuesday for colonoscopy on Wednesday; she does not have a PCP or adequate access to healthcare resources and has alarm symptoms including profound microcytic iron deficiency anemia and concerning CT findings in the abdomen and pelvis with regard to the cecum    We will continue to follow the patient with you    Subjective  Patient tolerating clear liquid diet, nausea has resolved, abdominal tenderness and pain of substantially improved currently with 2-3 out of 10, no nausea or vomiting, passage of flatus but no stool since hospital admission.    Objective  .  Vitals:     01/16/23 0804   BP: (!) 145/96   Pulse: 94   Resp: 16   Temp: 97.9 F (36.6 C)   SpO2: 94%   General-lying in bed, tired appearing, comfortable, not in acute distress  HEENT-calvarium atraumatic, sclera icteric mucous membranes dry  Chest-respiratory excursion bilaterally normal, normal respiratory and heart sounds on anterior auscultation  Abdomen-minimal close lower, soft, nondistended, minimal tenderness to deep palpation without rebound or guarding in the epigastrium, other quadrants nontender, positive bowel sounds  Extremities-warm well-perfused without edematous change    Labs  01/16/2023-chloride 109, other electrolytes unremarkable, likely secondary to IV fluid resuscitation, and BUN 14, creatinine 1.38 substantially elevated from previously normal baseline, random glucose of 111, WBC count 7.0, hemoglobin 9.2, MCV 75, count 14.3 K; substantial improvement in hemoglobin after transfusion of 2 units of PRBCs, iron 14, ferritin 5, iron saturation 5    Diagnostic imaging reviewed  CT imaging of the chest abdomen pelvis with IV contrast January 14, 2023-normal-appearing GE junction, diffuse benign-appearing thickening of gastric folds, inflammatory changes in the duodenal sweep without focal mass lesions, minimal with mucosal tearing but no lymphadenopathy associated with this, normal-appearing liver, gallbladder, pancreas, normal-appearing small bowel mesentery; circumferential thickening in the cecum with possible associated mass lesion on my review of the coronal imaging, moderate burden of pancolonic diverticulosis and mild to moderate fecal load, normal-appearing anorectum  January 13, 2023-chest x-ray unremarkable  January 13, 2023-right upper quadrant ultrasound unremarkable with CBD of 2 mm

## 2023-01-16 NOTE — Progress Notes (Signed)
Spiritual Care Assessment/Progress Note  [facility]    Name: Johana Feustel MRN: [id number]    Age: 42 y.o.     Sex: female   Language: Spanish     Date: 01/16/2023            Total Time Calculated: 15 min              Spiritual Assessment begun in [facility] B3 INTERMEDIATE CARE UNIT  Service Provided For:: Patient and family together  Referral/Consult From:: Nurse  Encounter Overview/Reason : Initial Encounter    Spiritual beliefs:      [x]  Involved in a faith tradition/spiritual practice: [employer] in [place]     [x]  Supported by a faith community:      []  Claims no spiritual orientation:      []  Seeking spiritual identity:           []  Adheres to an individual form of spirituality:      []  Not able to assess:                Identified resources for coping and support system:   Support System: Family members       []  Prayer                  []  Devotional reading               []  Music                  []  Guided Imagery     []  Pet visits                                        []  Other: (COMMENT)     Specific area/focus of visit   Encounter:    Crisis:    Spiritual/Emotional needs: Type: Spiritual Support, Other (comment) (emotional support)  Ritual, Rites and Sacraments:    Grief, Loss, and Adjustments:    Ethics/Mediation:    Behavioral Health:    Palliative Care:    Advance Care Planning:      Plan/Referrals: No future visits requested    Narrative:   Visited Ms Toto-Ambros in room B316 regarding consult for chaplain support. Patient appeared to be sleeping soundly. Patient's daughter was at her bedside and appeared in pretty good spirits.    Provided spiritual presence and active listening as daughter shared that she felt Ms Arey was feeling better and appeared much calmer. She said that patient was scheduled to have further testing on Monday. Acknowledged her feelings and concerns and offered words of support.    Daughter stated that they were members of a Catholic church in  [place] and that their faith community was aware of her admission to [facility]. Encouraged daughter to inform patient that chaplain had visited and to have chaplain notified if patient desired further support. Assured her of prayers on their behalf and of ongoing chaplain availability for support.    Claudie Fisherman. Marcheta Horsey, MDiv, [facility]  To page chaplain: [phone])

## 2023-01-16 NOTE — Plan of Care (Signed)
Problem: Discharge Planning  Goal: Discharge to home or other facility with appropriate resources  Outcome: Progressing     Problem: Pain  Goal: Verbalizes/displays adequate comfort level or baseline comfort level  Outcome: Progressing     Problem: ABCDS Injury Assessment  Goal: Absence of physical injury  Outcome: Progressing     Problem: Skin/Tissue Integrity  Goal: Absence of new skin breakdown  Description: 1.  Monitor for areas of redness and/or skin breakdown  2.  Assess vascular access sites hourly  3.  Every 4-6 hours minimum:  Change oxygen saturation probe site  4.  Every 4-6 hours:  If on nasal continuous positive airway pressure, respiratory therapy assess nares and determine need for appliance change or resting period.  Outcome: Progressing     Problem: Safety - Adult  Goal: Free from fall injury  Outcome: Progressing

## 2023-01-16 NOTE — Progress Notes (Signed)
[facility]  [address] [place], [place] [address]  [phone]      Hospitalist Progress Note      NAME: Melinda [name]   DOB:  1981/01/08  MRM:  [id number]    Date of service: 01/16/2023  10:58 AM       Assessment and Plan:   Abdominal pain, epigastric/melena.  Possible peptic ulcer disease.  Continue PPI IV twice daily.  Evaluated by GI and recommended EGD on Monday    2.  Macrocytic anemia.  Possible GI blood loss.  Patient stated that in the past, she noted melena.  Check stool for occult blood.    3.  SIRS: Leukocytosis, Elevated lactic acid level, Tachycardia.  Blood cultures remain negative. Was on Abx, which was discontinued. UC: Staph epidermidis, which is skin flora.        4.? UTI (urinary tract infection). UA: Many epithelial cells and UC: staph epidermidis. Likely contamination. Stopped Abx. No UTI on admission.      5.  AKI (acute kidney injury) (HCC) vs CKD.  Holding lisinopril and hydrochlorothiazide. Monitor Cr and cont IVF      5.  DM (diabetes mellitus) (HCC).  Continue home insulin and cover with sliding      6.  HTN (hypertension).  Holding hydrochlorothiazide and lisinopril due to renal insufficiency. Continue amlodipine and metoprolol      7.  Hyperlipidemia.  On statin         Subjective:     Chief Complaint:: Patient was seen and examined as a follow up for abdominal pain.  Chart was reviewed.  abdominal pain and vomiting better this morning     ROS:  (bold if positive, if negative)    Tolerating PT  Tolerating Diet        Objective:     Last 24hrs VS reviewed since prior progress note. Most recent are:    Vitals:    01/16/23 0804   BP: (!) 145/96   Pulse: 94   Resp: 16   Temp: 97.9 F (36.6 C)   SpO2: 94%     SpO2 Readings from Last 6 Encounters:   01/16/23 94%   06/29/22 100%   05/15/22 98%   02/16/22 99%          Intake/Output Summary (Last 24 hours) at 01/16/2023 1058  Last data filed at 01/15/2023 2015  Gross per 24 hour   Intake 1126.12 ml    Output --   Net 1126.12 ml          Physical Exam:    Gen:  Well-developed, well-nourished, in no acute distress  HEENT:  Pink conjunctivae, PERRL, hearing intact to voice, moist mucous membranes  Neck:  Supple, without masses, thyroid non-tender  Resp:  No accessory muscle use, clear breath sounds without wheezes rales or rhonchi  Card:  No murmurs, normal S1, S2 without thrills, bruits or peripheral edema  Abd:  Soft, non-tender, non-distended, normoactive bowel sounds are present, no palpable organomegaly and no detectable hernias  Lymph:  No cervical or inguinal adenopathy  Musc:  No cyanosis or clubbing  Skin:  No rashes or ulcers, skin turgor is good  Neuro:  Cranial nerves are grossly intact, no focal motor weakness, follows commands appropriately  Psych:  Good insight, oriented to person, place and time, alert  __________________________________________________________________  Medications Reviewed: (see below)  Medications:     Current Facility-Administered Medications   Medication Dose Route Frequency  0.9 % sodium chloride infusion   IntraVENous PRN    prochlorperazine (COMPAZINE) injection 10 mg  10 mg IntraVENous Q6H PRN    amLODIPine (NORVASC) tablet 10 mg  10 mg Oral Daily    fenofibrate (TRIGLIDE) tablet 160 mg  160 mg Oral Daily    metoprolol succinate (TOPROL XL) extended release tablet 25 mg  25 mg Oral Daily    atorvastatin (LIPITOR) tablet 10 mg  10 mg Oral Daily    sodium chloride flush 0.9 % injection 5-40 mL  5-40 mL IntraVENous 2 times per day    sodium chloride flush 0.9 % injection 5-40 mL  5-40 mL IntraVENous PRN    0.9 % sodium chloride infusion   IntraVENous PRN    potassium chloride (KLOR-CON) extended release tablet 40 mEq  40 mEq Oral PRN    Or    potassium bicarb-citric acid (EFFER-K) effervescent tablet 40 mEq  40 mEq Oral PRN    Or    potassium chloride 10 mEq/100 mL IVPB (Peripheral Line)  10 mEq IntraVENous PRN    magnesium sulfate 2000 mg in 50 mL IVPB premix  2,000 mg  IntraVENous PRN    ondansetron (ZOFRAN-ODT) disintegrating tablet 4 mg  4 mg Oral Q8H PRN    Or    ondansetron (ZOFRAN) injection 4 mg  4 mg IntraVENous Q6H PRN    polyethylene glycol (GLYCOLAX) packet 17 g  17 g Oral Daily PRN    acetaminophen (TYLENOL) tablet 650 mg  650 mg Oral Q6H PRN    Or    acetaminophen (TYLENOL) suppository 650 mg  650 mg Rectal Q6H PRN    cefTRIAXone (ROCEPHIN) 1,000 mg in sterile water 10 mL IV syringe  1,000 mg IntraVENous Q24H    insulin lispro (HUMALOG) injection vial 0-8 Units  0-8 Units SubCUTAneous TID WC    insulin lispro (HUMALOG) injection vial 0-4 Units  0-4 Units SubCUTAneous Nightly    morphine (PF) injection 2 mg  2 mg IntraVENous Q4H PRN    oxyCODONE (ROXICODONE) immediate release tablet 5 mg  5 mg Oral Q4H PRN    insulin glargine (LANTUS) injection vial 30 Units  30 Units SubCUTAneous Daily    ondansetron (ZOFRAN) injection 4 mg  4 mg IntraVENous q8h    prochlorperazine (COMPAZINE) tablet 5 mg  5 mg Oral Q6H PRN    pantoprazole (PROTONIX) 40 mg in sodium chloride (PF) 0.9 % 10 mL injection  40 mg IntraVENous Q12H    hydrALAZINE (APRESOLINE) injection 10 mg  10 mg IntraVENous Q8H PRN        Lab Data Reviewed: (see below)  Lab Review:     Recent Labs     01/14/23  0248 01/15/23  0447 01/16/23  0140   WBC 12.2* 6.5 7.0   HGB 7.6* 6.5* 9.2*   HCT 24.7* 21.0* 28.8*   PLT 477* 384 423*       Recent Labs     01/13/23  2111 01/15/23  0447 01/16/23  0140   NA 137 137 140   K 4.2 3.8 3.5   CL 109* 107 109*   CO2 24 26 28    BUN 28* 17 14   MG  --  1.6  --    ALT 22 11*  --        No results found for: "GLUCPOC"  No results for input(s): "PH", "PCO2", "PO2", "HCO3", "FIO2" in the last 72 hours.  No results for input(s): "INR" in the last 72  hours.  @MICRORESULTS @    I have reviewed notes of prior 24hr.    Other pertinent lab:     Total time: -35- minutes. I personally saw and examined the patient during this time period.  Greater than 50% of this time was spent in counseling and  coordination of care.    I personally reviewed chart, notes, data and current medications in the medical record.  I have personally examined and treated the patient at bedside during this period.                 Care Plan discussed with: Patient, Family, Nursing Staff, and >50% of time spent in counseling and coordination of care    Discussed:  Care Plan    Prophylaxis:  SCD's    Disposition:  Home w/Family           ___________________________________________________    Attending Physician: Otho Darner, MD

## 2023-01-17 LAB — BASIC METABOLIC PANEL
Anion Gap: 5 mmol/L (ref 5–15)
BUN/Creatinine Ratio: 8 — ABNORMAL LOW (ref 12–20)
BUN: 10 MG/DL (ref 6–20)
CO2: 27 mmol/L (ref 21–32)
Calcium: 8.3 MG/DL — ABNORMAL LOW (ref 8.5–10.1)
Chloride: 107 mmol/L (ref 97–108)
Creatinine: 1.25 MG/DL — ABNORMAL HIGH (ref 0.55–1.02)
Est, Glom Filt Rate: 56 mL/min/{1.73_m2} — ABNORMAL LOW (ref >60)
Glucose: 99 mg/dL (ref 65–100)
Potassium: 3.4 mmol/L — ABNORMAL LOW (ref 3.5–5.1)
Sodium: 139 mmol/L (ref 136–145)

## 2023-01-17 LAB — CBC WITH AUTO DIFFERENTIAL
Basophils %: 1 % (ref 0–1)
Basophils Absolute: 0.1 10*3/uL (ref 0.0–0.1)
Eosinophils %: 5 % (ref 0–7)
Eosinophils Absolute: 0.4 10*3/uL (ref 0.0–0.4)
Hematocrit: 27.3 % — ABNORMAL LOW (ref 35.0–47.0)
Hemoglobin: 8.6 g/dL — ABNORMAL LOW (ref 11.5–16.0)
Immature Granulocytes %: 0 % (ref 0.0–0.5)
Immature Granulocytes Absolute: 0 10*3/uL (ref 0.00–0.04)
Lymphocytes %: 26 % (ref 12–49)
Lymphocytes Absolute: 2.4 10*3/uL (ref 0.8–3.5)
MCH: 24.1 PG — ABNORMAL LOW (ref 26.0–34.0)
MCHC: 31.5 g/dL (ref 30.0–36.5)
MCV: 76.5 FL — ABNORMAL LOW (ref 80.0–99.0)
MPV: 9.2 FL (ref 8.9–12.9)
Monocytes %: 7 % (ref 5–13)
Monocytes Absolute: 0.6 10*3/uL (ref 0.0–1.0)
Neutrophils %: 61 % (ref 32–75)
Neutrophils Absolute: 5.7 10*3/uL (ref 1.8–8.0)
Nucleated RBCs: 0 PER 100 WBC
Platelets: 418 10*3/uL — ABNORMAL HIGH (ref 150–400)
RBC: 3.57 M/uL — ABNORMAL LOW (ref 3.80–5.20)
RDW: 16.6 % — ABNORMAL HIGH (ref 11.5–14.5)
WBC: 9.3 10*3/uL (ref 3.6–11.0)
nRBC: 0 10*3/uL (ref 0.00–0.01)

## 2023-01-17 LAB — POCT GLUCOSE
POC Glucose: 102 mg/dL (ref 65–117)
POC Glucose: 82 mg/dL (ref 65–117)
POC Glucose: 126 mg/dL — ABNORMAL HIGH (ref 65–117)
POC Glucose: 90 mg/dL (ref 65–117)
POC Glucose: 100 mg/dL (ref 65–117)

## 2023-01-17 LAB — EKG 12-LEAD
Atrial Rate: 110 {beats}/min
P Axis: 50 degrees
P-R Interval: 134 ms
Q-T Interval: 348 ms
QRS Duration: 80 ms
QTc Calculation (Bazett): 470 ms
R Axis: -52 degrees
T Axis: 53 degrees
Ventricular Rate: 110 {beats}/min

## 2023-01-17 MED ORDER — DEXTROSE 10 % IV BOLUS
INTRAVENOUS | Status: AC | PRN
Start: 2023-01-17 — End: 2023-01-20

## 2023-01-17 MED ORDER — PROPOFOL 200 MG/20ML IV EMUL
200 MG/20ML | INTRAVENOUS | Status: DC | PRN
  Administered 2023-01-17: 16:00:00 50 via INTRAVENOUS
  Administered 2023-01-17: 16:00:00 100 via INTRAVENOUS

## 2023-01-17 MED ORDER — GLUCAGON (RDNA) 1 MG IJ KIT
1 MG | INTRAMUSCULAR | Status: AC | PRN
Start: 2023-01-17 — End: 2023-01-20

## 2023-01-17 MED ORDER — METOCLOPRAMIDE HCL 5 MG/ML IJ SOLN
5 | Freq: Two times a day (BID) | INTRAMUSCULAR | Status: AC
Start: 2023-01-17 — End: 2023-01-18
  Administered 2023-01-17 – 2023-01-18 (×3): 10 mg via INTRAVENOUS

## 2023-01-17 MED ORDER — LIDOCAINE HCL (PF) 2 % IJ SOLN
2 % | INTRAMUSCULAR | Status: DC | PRN
  Administered 2023-01-17: 16:00:00 50 via INTRAVENOUS

## 2023-01-17 MED ORDER — POTASSIUM CHLORIDE ER 10 MEQ PO TBCR
10 | Freq: Once | ORAL | Status: AC
Start: 2023-01-17 — End: 2023-01-17
  Administered 2023-01-17: 17:00:00 40 meq via ORAL

## 2023-01-17 MED ORDER — DEXTROSE 10 % IV SOLN
10 % | INTRAVENOUS | Status: AC | PRN
Start: 2023-01-17 — End: 2023-01-20

## 2023-01-17 MED ORDER — SODIUM CHLORIDE 0.9 % IV SOLN
0.9 | INTRAVENOUS | Status: DC | PRN
Start: 2023-01-17 — End: 2023-01-17

## 2023-01-17 MED ORDER — NORMAL SALINE FLUSH 0.9 % IV SOLN
0.9 | INTRAVENOUS | Status: DC | PRN
Start: 2023-01-17 — End: 2023-01-17

## 2023-01-17 MED ORDER — NORMAL SALINE FLUSH 0.9 % IV SOLN
0.9 | Freq: Two times a day (BID) | INTRAVENOUS | Status: DC
Start: 2023-01-17 — End: 2023-01-17

## 2023-01-17 MED ORDER — GLUCOSE 4 G PO CHEW
4 g | ORAL | Status: AC | PRN
Start: 2023-01-17 — End: 2023-01-20

## 2023-01-17 MED FILL — FENOFIBRATE 160 MG PO TABS: 160 MG | ORAL | Qty: 1

## 2023-01-17 MED FILL — MORPHINE SULFATE 2 MG/ML IJ SOLN: 2 mg/mL | INTRAMUSCULAR | Qty: 1

## 2023-01-17 MED FILL — ONDANSETRON HCL 4 MG/2ML IJ SOLN: 4 MG/2ML | INTRAMUSCULAR | Qty: 2

## 2023-01-17 MED FILL — METOPROLOL SUCCINATE ER 25 MG PO TB24: 25 MG | ORAL | Qty: 1

## 2023-01-17 MED FILL — PANTOPRAZOLE SODIUM 40 MG IV SOLR: 40 MG | INTRAVENOUS | Qty: 40

## 2023-01-17 MED FILL — BD POSIFLUSH 0.9 % IV SOLN: 0.9 % | INTRAVENOUS | Qty: 40

## 2023-01-17 MED FILL — ATORVASTATIN CALCIUM 10 MG PO TABS: 10 MG | ORAL | Qty: 1

## 2023-01-17 MED FILL — DEXTROSE 10 % IV SOLN: 10 % | INTRAVENOUS | Qty: 500

## 2023-01-17 MED FILL — AMLODIPINE BESYLATE 5 MG PO TABS: 5 MG | ORAL | Qty: 2

## 2023-01-17 MED FILL — OXYCODONE HCL 5 MG PO TABS: 5 MG | ORAL | Qty: 1

## 2023-01-17 MED FILL — ACETAMINOPHEN 325 MG PO TABS: 325 MG | ORAL | Qty: 2

## 2023-01-17 MED FILL — POTASSIUM CHLORIDE ER 10 MEQ PO TBCR: 10 MEQ | ORAL | Qty: 4

## 2023-01-17 MED FILL — METOCLOPRAMIDE HCL 5 MG/ML IJ SOLN: 5 MG/ML | INTRAMUSCULAR | Qty: 2

## 2023-01-17 NOTE — Plan of Care (Signed)
Problem: Discharge Planning  Goal: Discharge to home or other facility with appropriate resources  Outcome: Progressing     Problem: Pain  Goal: Verbalizes/displays adequate comfort level or baseline comfort level  Outcome: Progressing     Problem: ABCDS Injury Assessment  Goal: Absence of physical injury  Outcome: Progressing     Problem: Skin/Tissue Integrity  Goal: Absence of new skin breakdown  Description: 1.  Monitor for areas of redness and/or skin breakdown  2.  Assess vascular access sites hourly  3.  Every 4-6 hours minimum:  Change oxygen saturation probe site  4.  Every 4-6 hours:  If on nasal continuous positive airway pressure, respiratory therapy assess nares and determine need for appliance change or resting period.  Outcome: Progressing     Problem: Safety - Adult  Goal: Free from fall injury  Outcome: Progressing     Problem: Chronic Conditions and Co-morbidities  Goal: Patient's chronic conditions and co-morbidity symptoms are monitored and maintained or improved  Outcome: Progressing

## 2023-01-17 NOTE — Care Coordination-Inpatient (Signed)
Care Management Progress Note:   Per IDR, patient for EGD today. No anticipated CM needs at this time, dispo is home with family when medically stable to do so.  ______________________  Lucilla Lame BSN, RN  Care Management  01/17/2023  2:20 PM

## 2023-01-17 NOTE — Progress Notes (Signed)
TRANSFER - OUT REPORT:    Verbal report given to [facility] RN on Melinda [name]  being transferred to 405 for routine progression of patient care       Report consisted of patient's Situation, Background, Assessment and   Recommendations(SBAR).     Information from the following report(s) Nurse Handoff Report, Index, ED Encounter Summary, ED SBAR, Adult Overview, Intake/Output, MAR, Recent Results, Med Rec Status, Cardiac Rhythm NSR, Alarm Parameters, Pre Procedure Checklist, Procedure Verification, Quality Measures, Neuro Assessment, and Event Log was reviewed with the receiving nurse.           Lines:   Peripheral IV 01/16/23 Left;Lateral Forearm (Active)   Site Assessment Clean, dry & intact 01/17/23 1212   Line Status Infusing 01/17/23 1212   Line Care Connections checked and tightened 01/17/23 1212   Phlebitis Assessment No symptoms 01/17/23 1212   Infiltration Assessment 0 01/17/23 1212   Alcohol Cap Used Yes 01/17/23 1212   Dressing Status Clean, dry & intact 01/17/23 1212   Dressing Type Transparent 01/17/23 1212        Opportunity for questions and clarification was provided.      Patient transported with:  Personal belongings, wheelchair, telebox, and tech.

## 2023-01-17 NOTE — Anesthesia Post-Procedure Evaluation (Signed)
Department of Anesthesiology  Postprocedure Note    Patient: Melinda [name]  MRN: [id number]  Birthdate: May 11, 1981  Date of evaluation: 01/17/2023    Procedure Summary       Date: 01/17/23 Room / Location: [facility] ENDO 02 / [facility] END[facility]    Anesthesia Start: 1151 Anesthesia Stop: 1206    Procedure: ESOPHAGOGASTRODUODENOSCOPY Diagnosis:       Melena      (Melena [K92.1])    Surgeons: Glyn Ade, MD Responsible Provider: Cherlynn Kaiser, MD    Anesthesia Type: MAC ASA Status: 2            Anesthesia Type: MAC    Aldrete Phase I: Aldrete Score: 10    Aldrete Phase II:      Anesthesia Post Evaluation    Patient location during evaluation: PACU  Patient participation: complete - patient participated  Level of consciousness: awake  Pain score: 0  Airway patency: patent  Nausea & Vomiting: no nausea and no vomiting  Cardiovascular status: blood pressure returned to baseline  Respiratory status: acceptable  Hydration status: euvolemic  Pain management: adequate    No notable events documented.

## 2023-01-17 NOTE — Other (Signed)
TRANSFER - IN REPORT:    Verbal report received from [facility] on [place] Toto-Ambros  being received from 316 for ordered procedure      Report consisted of patient's Situation, Background, Assessment and   Recommendations(SBAR).     Information from the following report(s) Med Rec Status, Cardiac Rhythm Sinus Rhythm to Sinus tachy, Alarm Parameters, Pre Procedure Checklist, and Procedure Verification was reviewed with the receiving nurse.    Opportunity for questions and clarification was provided.      Assessment completed upon patient's arrival to unit and care assumed.

## 2023-01-17 NOTE — Other (Signed)
1240   TRANSFER - OUT REPORT:    Verbal report given to [facility] on Melinda [name]  being transferred to 316 for routine progression of patient care       Report consisted of patient's Situation, Background, Assessment and   Recommendations(SBAR).     Information from the following report(s) Nurse Handoff Report was reviewed with the receiving nurse.           Lines:   Peripheral IV 01/16/23 Left;Lateral Forearm (Active)   Site Assessment Clean, dry & intact 01/17/23 1212   Line Status Infusing 01/17/23 1212   Line Care Connections checked and tightened 01/17/23 1212   Phlebitis Assessment No symptoms 01/17/23 1212   Infiltration Assessment 0 01/17/23 1212   Alcohol Cap Used Yes 01/17/23 1212   Dressing Status Clean, dry & intact 01/17/23 1212   Dressing Type Transparent 01/17/23 1212        Opportunity for questions and clarification was provided.      Patient transported with:  Monitor and Tech      [id number]   Patient has been evaluated by anesthesia pre-procedure.    Patient alert and oriented.     Vital signs will not be charted by the Endoscopy nurse.     All vitals, airway, and loc are monitored by anesthesia staff, [place], throughout procedure.         1200   Endoscope was pre-cleaned at bedside immediately following procedure by endo tech, Jerilee Field.      1205   Patient tolerated procedure.   Abdomen soft and patient arousable and voices no complaints.   Patient transported to endoscopy recovery area.   Report given to post procedure RN, Silvestre Mesi.

## 2023-01-17 NOTE — Progress Notes (Signed)
[facility]  [address], [place] [address]  [phone]      Hospitalist Progress Note      NAME: Melinda [name]   DOB:  [birth year]  MRM:  [id number]    Date of service: 01/17/2023  11:22 AM       Assessment and Plan:   Epigastric abdominal pain/melena.  Possible peptic ulcer disease.  Continue PPI IV twice daily.  Evaluated by GI. EGD on Monday    2.  Microcytic anemia.  Possible GI blood loss.  Patient stated that in the past, she noted melena.  Check stool for occult blood.    3.  SIRS: Leukocytosis, Elevated lactic acid level, Tachycardia.  Resolved. Blood cultures remain negative. Was on Abx, which was discontinued. UC: Staph epidermidis, which is skin flora and a contamination.        4.? UTI (urinary tract infection). UA: Many epithelial cells and UC: staph epidermidis. Likely contamination. Stopped Abx. No UTI on admission.      5.  AKI (acute kidney injury) (HCC) vs CKD.  Holding lisinopril and hydrochlorothiazide. Monitor Cr and cont IVF      5.  DM (diabetes mellitus) (HCC).  Continue home insulin and cover with sliding      6.  HTN (hypertension).  Holding hydrochlorothiazide and lisinopril due to renal insufficiency. Continue amlodipine and metoprolol      7.  Hyperlipidemia.  On statin         Subjective:     Chief Complaint:: Patient was seen and examined as a follow up for abdominal pain.  Chart was reviewed.  abdominal pain and vomiting better this morning     ROS:  (bold if positive, if negative)    Tolerating PT  Tolerating Diet        Objective:     Last 24hrs VS reviewed since prior progress note. Most recent are:    Vitals:    01/17/23 0833   BP: (!) 153/90   Pulse: 92   Resp: 16   Temp: 98.4 F (36.9 C)   SpO2: 99%     SpO2 Readings from Last 6 Encounters:   01/17/23 99%   06/29/22 100%   05/15/22 98%   02/16/22 99%        No intake or output data in the 24 hours ending 01/17/23 1122       Physical Exam:    Gen:  Well-developed, well-nourished,  in no acute distress  HEENT:  Pink conjunctivae, PERRL, hearing intact to voice, moist mucous membranes  Neck:  Supple, without masses, thyroid non-tender  Resp:  No accessory muscle use, clear breath sounds without wheezes rales or rhonchi  Card:  No murmurs, normal S1, S2 without thrills, bruits or peripheral edema  Abd:  Soft, non-tender, non-distended, normoactive bowel sounds are present, no palpable organomegaly and no detectable hernias  Lymph:  No cervical or inguinal adenopathy  Musc:  No cyanosis or clubbing  Skin:  No rashes or ulcers, skin turgor is good  Neuro:  Cranial nerves are grossly intact, no focal motor weakness, follows commands appropriately  Psych:  Good insight, oriented to person, place and time, alert  __________________________________________________________________  Medications Reviewed: (see below)  Medications:     Current Facility-Administered Medications   Medication Dose Route Frequency    potassium chloride (KLOR-CON) extended release tablet 40 mEq  40 mEq Oral Once    0.9 % sodium chloride infusion   IntraVENous  PRN    prochlorperazine (COMPAZINE) injection 10 mg  10 mg IntraVENous Q6H PRN    amLODIPine (NORVASC) tablet 10 mg  10 mg Oral Daily    fenofibrate (TRIGLIDE) tablet 160 mg  160 mg Oral Daily    metoprolol succinate (TOPROL XL) extended release tablet 25 mg  25 mg Oral Daily    atorvastatin (LIPITOR) tablet 10 mg  10 mg Oral Daily    sodium chloride flush 0.9 % injection 5-40 mL  5-40 mL IntraVENous 2 times per day    sodium chloride flush 0.9 % injection 5-40 mL  5-40 mL IntraVENous PRN    0.9 % sodium chloride infusion   IntraVENous PRN    potassium chloride (KLOR-CON) extended release tablet 40 mEq  40 mEq Oral PRN    Or    potassium bicarb-citric acid (EFFER-K) effervescent tablet 40 mEq  40 mEq Oral PRN    Or    potassium chloride 10 mEq/100 mL IVPB (Peripheral Line)  10 mEq IntraVENous PRN    magnesium sulfate 2000 mg in 50 mL IVPB premix  2,000 mg IntraVENous PRN     ondansetron (ZOFRAN-ODT) disintegrating tablet 4 mg  4 mg Oral Q8H PRN    Or    ondansetron (ZOFRAN) injection 4 mg  4 mg IntraVENous Q6H PRN    polyethylene glycol (GLYCOLAX) packet 17 g  17 g Oral Daily PRN    acetaminophen (TYLENOL) tablet 650 mg  650 mg Oral Q6H PRN    Or    acetaminophen (TYLENOL) suppository 650 mg  650 mg Rectal Q6H PRN    insulin lispro (HUMALOG) injection vial 0-8 Units  0-8 Units SubCUTAneous TID WC    insulin lispro (HUMALOG) injection vial 0-4 Units  0-4 Units SubCUTAneous Nightly    morphine (PF) injection 2 mg  2 mg IntraVENous Q4H PRN    oxyCODONE (ROXICODONE) immediate release tablet 5 mg  5 mg Oral Q4H PRN    insulin glargine (LANTUS) injection vial 30 Units  30 Units SubCUTAneous Daily    ondansetron (ZOFRAN) injection 4 mg  4 mg IntraVENous q8h    prochlorperazine (COMPAZINE) tablet 5 mg  5 mg Oral Q6H PRN    pantoprazole (PROTONIX) 40 mg in sodium chloride (PF) 0.9 % 10 mL injection  40 mg IntraVENous Q12H    hydrALAZINE (APRESOLINE) injection 10 mg  10 mg IntraVENous Q8H PRN        Lab Data Reviewed: (see below)  Lab Review:     Recent Labs     01/15/23  0447 01/16/23  0140 01/17/23  0407   WBC 6.5 7.0 9.3   HGB 6.5* 9.2* 8.6*   HCT 21.0* 28.8* 27.3*   PLT 384 423* 418*       Recent Labs     01/15/23  0447 01/16/23  0140 01/17/23  0407   NA 137 140 139   K 3.8 3.5 3.4*   CL 107 109* 107   CO2 26 28 27    BUN 17 14 10    MG 1.6  --   --    ALT 11*  --   --        No results found for: "GLUCPOC"  No results for input(s): "PH", "PCO2", "PO2", "HCO3", "FIO2" in the last 72 hours.  No results for input(s): "INR" in the last 72 hours.  @MICRORESULTS @    I have reviewed notes of prior 24hr.    Other pertinent lab:     Total time: -  35- minutes. I personally saw and examined the patient during this time period.  Greater than 50% of this time was spent in counseling and coordination of care.    I personally reviewed chart, notes, data and current medications in the medical record.  I  have personally examined and treated the patient at bedside during this period.                 Care Plan discussed with: Patient, Family, Nursing Staff, and >50% of time spent in counseling and coordination of care    Discussed:  Care Plan    Prophylaxis:  SCD's    Disposition:  Home w/Family           ___________________________________________________    Attending Physician: Otho DarnerMesfin Zealand Boyett, MD

## 2023-01-17 NOTE — Consults (Signed)
Session ID: [id number]  Language: Spanish  Interpreter ID: [id number]  Interpreter Name: Donnetta Simpers

## 2023-01-17 NOTE — Consults (Signed)
Session ID: [id number]  Language: Spanish  Interpreter ID: [id number]  Interpreter Name: Rivka Barbara

## 2023-01-17 NOTE — Anesthesia Pre-Procedure Evaluation (Signed)
Department of Anesthesiology  Preprocedure Note       Name:  Rogelia BogaRosa Maria Toto-Ambros   Age:  [age] y.o.  DOB:  03/20/1981                                          MRN:  [id number]         Date:  01/17/2023      Surgeon: Moishe SpiceSurgeon(s):  Glyn AdeHoque, Rafaz, MD    Procedure: Procedure(s):  ESOPHAGOGASTRODUODENOSCOPY    Medications prior to admission:   Prior to Admission medications    Medication Sig Start Date End Date Taking? Authorizing Provider   spironolactone (ALDACTONE) 25 MG tablet Take 1 tablet by mouth daily   Yes [provider]   lisinopril (PRINIVIL;ZESTRIL) 40 MG tablet Take 1 tablet by mouth daily   Yes [provider]   amLODIPine (NORVASC) 10 MG tablet Take 1 tablet by mouth daily   Yes [provider]   hydroCHLOROthiazide (HYDRODIURIL) 25 MG tablet Take 1 tablet by mouth daily   Yes [provider]   insulin 70-30 (NOVOLIN 70/30) (70-30) 100 UNIT per ML injection vial Inject 15 Units into the skin 2 times daily 07/10/21   Automatic Reconciliation, Ar       Current medications:    Current Facility-Administered Medications   Medication Dose Route Frequency Provider Last Rate Last Admin   . potassium chloride (KLOR-CON) extended release tablet 40 mEq  40 mEq Oral Once Tefera, Mesfin A, MD       . 0.9 % sodium chloride infusion   IntraVENous PRN Tefera, Mesfin A, MD       . prochlorperazine (COMPAZINE) injection 10 mg  10 mg IntraVENous Q6H PRN Tefera, Mesfin A, MD   10 mg at 01/16/23 0843   . amLODIPine (NORVASC) tablet 10 mg  10 mg Oral Daily Tefera, Mesfin A, MD   10 mg at 01/17/23 0834   . fenofibrate (TRIGLIDE) tablet 160 mg  160 mg Oral Daily Tefera, Mesfin A, MD   160 mg at 01/17/23 0834   . metoprolol succinate (TOPROL XL) extended release tablet 25 mg  25 mg Oral Daily Tefera, Mesfin A, MD   25 mg at 01/17/23 0834   . atorvastatin (LIPITOR) tablet 10 mg  10 mg Oral Daily Tefera, Mesfin A, MD   10 mg at 01/17/23 0835   . sodium chloride flush 0.9 % injection 5-40 mL  5-40 mL  IntraVENous 2 times per day Tefera, Mesfin A, MD   10 mL at 01/17/23 0835   . sodium chloride flush 0.9 % injection 5-40 mL  5-40 mL IntraVENous PRN Tefera, Mesfin A, MD       . 0.9 % sodium chloride infusion   IntraVENous PRN Tefera, Mesfin A, MD       . potassium chloride (KLOR-CON) extended release tablet 40 mEq  40 mEq Oral PRN Tefera, Mesfin A, MD        Or   . potassium bicarb-citric acid (EFFER-K) effervescent tablet 40 mEq  40 mEq Oral PRN Tefera, Mesfin A, MD        Or   . potassium chloride 10 mEq/100 mL IVPB (Peripheral Line)  10 mEq IntraVENous PRN Tefera, Mesfin A, MD       . magnesium sulfate 2000 mg in 50 mL IVPB premix  2,000 mg IntraVENous PRN Tefera, Mesfin A, MD       .  ondansetron (ZOFRAN-ODT) disintegrating tablet 4 mg  4 mg Oral Q8H PRN Tefera, Mesfin A, MD        Or   . ondansetron (ZOFRAN) injection 4 mg  4 mg IntraVENous Q6H PRN Tefera, Mesfin A, MD   4 mg at 01/15/23 0839   . polyethylene glycol (GLYCOLAX) packet 17 g  17 g Oral Daily PRN Tefera, Mesfin A, MD       . acetaminophen (TYLENOL) tablet 650 mg  650 mg Oral Q6H PRN Tefera, Mesfin A, MD   650 mg at 01/17/23 0834    Or   . acetaminophen (TYLENOL) suppository 650 mg  650 mg Rectal Q6H PRN Tefera, Mesfin A, MD       . insulin lispro (HUMALOG) injection vial 0-8 Units  0-8 Units SubCUTAneous TID WC Tefera, Mesfin A, MD   2 Units at 01/15/23 1005   . insulin lispro (HUMALOG) injection vial 0-4 Units  0-4 Units SubCUTAneous Nightly Tefera, Mesfin A, MD       . morphine (PF) injection 2 mg  2 mg IntraVENous Q4H PRN Tefera, Mesfin A, MD   2 mg at 01/17/23 0337   . oxyCODONE (ROXICODONE) immediate release tablet 5 mg  5 mg Oral Q4H PRN Tefera, Mesfin A, MD   5 mg at 01/17/23 0834   . insulin glargine (LANTUS) injection vial 30 Units  30 Units SubCUTAneous Daily Jaci Lazier, MD   30 Units at 01/16/23 [id number]   . ondansetron (ZOFRAN) injection 4 mg  4 mg IntraVENous q8h Jaci Lazier, MD   4 mg at 01/17/23 8119   . prochlorperazine  (COMPAZINE) tablet 5 mg  5 mg Oral Q6H PRN Jaci Lazier, MD   5 mg at 01/15/23 0334   . pantoprazole (PROTONIX) 40 mg in sodium chloride (PF) 0.9 % 10 mL injection  40 mg IntraVENous Q12H Bright, Abigail J, PA-C   40 mg at 01/17/23 0834   . hydrALAZINE (APRESOLINE) injection 10 mg  10 mg IntraVENous Q8H PRN Jaci Lazier, MD   10 mg at 01/15/23 1478       Allergies:  No Known Allergies    Problem List:    Patient Active Problem List   Diagnosis Code   . Nausea and vomiting R11.2   . Sepsis (HCC) A41.9   . Abdominal pain R10.9   . UTI (urinary tract infection) N39.0   . AKI (acute kidney injury) (HCC) N17.9   . Leukocytosis D72.829   . Elevated lactic acid level R79.89   . Tachycardia R00.0   . DM (diabetes mellitus) (HCC) E11.9   . HTN (hypertension) I10   . Hyperlipidemia E78.5       Past Medical History:        Diagnosis Date   . High blood pressure    . High cholesterol    . Type 2 diabetes mellitus (HCC)     Controled       Past Surgical History:        Procedure Laterality Date   . OTHER SURGICAL HISTORY Left     Rentia attachment       Social History:    Social History     Tobacco Use   . Smoking status: Never   . Smokeless tobacco: Never   Substance Use Topics   . Alcohol use: Never  Counseling given: Not Answered      Vital Signs (Current):   Vitals:    01/16/23 2325 01/17/23 0320 01/17/23 0831 01/17/23 0833   BP: 121/68 (!) 167/99 (!) 157/93 (!) 153/90   Pulse: 81 91 89 92   Resp: 14 16  16    Temp: 98.4 F (36.9 C) 99 F (37.2 C) 98.2 F (36.8 C) 98.4 F (36.9 C)   TempSrc: Oral Oral  Oral   SpO2: 96% 99% 100% 99%   Weight:       Height:                                                  BP Readings from Last 3 Encounters:   01/17/23 (!) 153/90   06/29/22 (!) 165/97   05/15/22 (!) 183/83       NPO Status: Time of last liquid consumption: 2230                        Time of last solid consumption: 2230                        Date of last liquid consumption: 01/16/23                         Date of last solid food consumption: 01/16/23    BMI:   Wt Readings from Last 3 Encounters:   01/13/23 65.8 kg (145 lb)   06/28/22 59.9 kg (132 lb)   05/15/22 62.6 kg (138 lb)     Body mass index is 29.29 kg/m.    CBC:   Lab Results   Component Value Date/Time    WBC 9.3 01/17/2023 04:07 AM    RBC 3.57 01/17/2023 04:07 AM    HGB 8.6 01/17/2023 04:07 AM    HCT 27.3 01/17/2023 04:07 AM    MCV 76.5 01/17/2023 04:07 AM    RDW 16.6 01/17/2023 04:07 AM    PLT 418 01/17/2023 04:07 AM       CMP:   Lab Results   Component Value Date/Time    NA 139 01/17/2023 04:07 AM    K 3.4 01/17/2023 04:07 AM    CL 107 01/17/2023 04:07 AM    CO2 27 01/17/2023 04:07 AM    BUN 10 01/17/2023 04:07 AM    CREATININE 1.25 01/17/2023 04:07 AM    GFRAA >60 07/10/2021 12:01 AM    AGRATIO 0.7 01/15/2023 04:47 AM    AGRATIO 0.8 10/22/2021 10:00 AM    LABGLOM 56 01/17/2023 04:07 AM    GLUCOSE 99 01/17/2023 04:07 AM    PROT 5.5 01/15/2023 04:47 AM    CALCIUM 8.3 01/17/2023 04:07 AM    BILITOT 0.6 01/15/2023 04:47 AM    ALKPHOS 87 01/15/2023 04:47 AM    ALKPHOS 98 10/22/2021 10:00 AM    AST 13 01/15/2023 04:47 AM    ALT 11 01/15/2023 04:47 AM       POC Tests:   Recent Labs     01/17/23  0834   POCGLU 126*       Coags: No results found for: "PROTIME", "INR", "APTT"    HCG (If Applicable):   Lab Results   Component Value Date    PREGTESTUR Negative 01/14/2023  ABGs: No results found for: "PHART", "PO2ART", "PCO2ART", "HCO3ART", "BEART", "O2SATART"     Type & Screen (If Applicable):  No results found for: "LABABO", "LABRH"    Drug/Infectious Status (If Applicable):  No results found for: "HIV", "HEPCAB"    COVID-19 Screening (If Applicable):   Lab Results   Component Value Date/Time    COVID19 Not detected 06/28/2022 09:52 PM           Anesthesia Evaluation  Patient summary reviewed and Nursing notes reviewed  Airway: Mallampati: II  TM distance: >3 FB   Neck ROM: full  Mouth opening: > = 3 FB   Dental:    (+) poor dentition       Pulmonary:Negative Pulmonary ROS breath sounds clear to auscultation                             Cardiovascular:  Exercise tolerance: good (>4 METS)  (+) hypertension:        Rhythm: regular  Rate: normal                    Neuro/Psych:   Negative Neuro/Psych ROS              GI/Hepatic/Renal:             Endo/Other:    (+) Diabetes.                 Abdominal:             Vascular: negative vascular ROS.         Other Findings:       Anesthesia Plan      MAC     ASA 2       Induction: intravenous.      Anesthetic plan and risks discussed with patient.      Plan discussed with CRNA.                Cherlynn Kaiser, MD   01/17/2023

## 2023-01-17 NOTE — H&P (Signed)
.Pre-Endoscopy H&P Update  Chief complaint/HPI/ROS:  The indication for the procedure, the patient's history and the patient's current medications are reviewed prior to the procedure and that data is reported on the H&P to which this document is attached.  Any significant complaints with regard to organ systems will be noted, and if not mentioned then a review of systems is not contributory.  Past Medical History:   Diagnosis Date    High blood pressure     High cholesterol     Type 2 diabetes mellitus (HCC)     Controled      Past Surgical History:   Procedure Laterality Date    OTHER SURGICAL HISTORY Left     Rentia attachment     Social   Social History     Tobacco Use    Smoking status: Never    Smokeless tobacco: Never   Substance Use Topics    Alcohol use: Never      No family history on file.   No Known Allergies   Prior to Admission Medications   Prescriptions Last Dose Informant Patient Reported? Taking?   amLODIPine (NORVASC) 10 MG tablet 01/13/2023  Yes Yes   Sig: Take 1 tablet by mouth daily   amLODIPine (NORVASC) 5 MG tablet   No No   Sig: Take 2 tablets by mouth daily   amLODIPine (NORVASC) 5 MG tablet   No No   Sig: Take 2 tablets by mouth daily   fenofibrate (TRIGLIDE) 160 MG tablet   Yes No   Sig: Take 1 tablet by mouth daily   ferrous sulfate (IRON 325) 325 (65 Fe) MG tablet   Yes No   Sig: Take by mouth every morning (before breakfast)   Patient not taking: Reported on 02/16/2022   hydroCHLOROthiazide (HYDRODIURIL) 25 MG tablet   Yes No   Sig: Take 1 tablet by mouth daily   hydroCHLOROthiazide (HYDRODIURIL) 25 MG tablet   No No   Sig: Take 1 tablet by mouth every morning   hydroCHLOROthiazide (HYDRODIURIL) 25 MG tablet 01/13/2023  Yes Yes   Sig: Take 1 tablet by mouth daily   insulin 70-30 (NOVOLIN 70/30) (70-30) 100 UNIT per ML injection vial   Yes No   Sig: Inject 15 Units into the skin 2 times daily   lisinopril (PRINIVIL;ZESTRIL) 10 MG tablet   Yes No   Sig: Take 2 tablets by mouth daily    lisinopril (PRINIVIL;ZESTRIL) 40 MG tablet 01/13/2023  Yes Yes   Sig: Take 1 tablet by mouth daily   metoprolol succinate (TOPROL XL) 25 MG extended release tablet   Yes No   Sig: Take 1 tablet by mouth daily   metoprolol succinate (TOPROL XL) 25 MG extended release tablet   No No   Sig: Take 1 tablet by mouth daily   naproxen (NAPROSYN) 500 MG tablet   No No   Sig: Take 1 tablet by mouth 2 times daily (with meals)   silver sulfADIAZINE (SILVADENE) 1 % cream   No No   Sig: Apply topically twice daily.   simvastatin (ZOCOR) 20 MG tablet   Yes No   Sig: Take 1 tablet by mouth nightly   Patient not taking: Reported on 02/16/2022   simvastatin (ZOCOR) 20 MG tablet   No No   Sig: Take 1 tablet by mouth nightly   spironolactone (ALDACTONE) 25 MG tablet 01/13/2023  Yes Yes   Sig: Take 1 tablet by mouth daily  Facility-Administered Medications: None       PHYSICAL EXAM:  The patient is examined immediately prior to the procedure.  Vitals:    01/17/23 0833   BP: (!) 153/90   Pulse: 92   Resp: 16   Temp: 98.4 F (36.9 C)   SpO2: 99%     Gen: Appears comfortable, no distress.  Pulm: comfortable respirations with no abnormal audible breath sounds  HEART: well perfused, no abnormal audible heart sounds  GI: abdomen flat.    PLAN:  Informed consent discussion held, patient afforded an opportunity to ask questions and all questions answered.  After being advised of the risks, benefits, and alternatives, the patient requested that we proceed and indicated so on a written consent form.      Will proceed with procedure as planned.  Glyn Ade, MD

## 2023-01-17 NOTE — Op Note (Signed)
.                           [facility]  [place] [facility] [place], [place]  [phone]      January 17, 2023    Esophagogastroduodenoscopy (EGD) Procedure Note  Melinda [name]  DOB: 1981-08-26  Melinda [name]: [id number]      Indications:   melena, epigastric pain   Referring Physician:  No primary care provider on file.  Anesthesia/Sedation: Monitored anesthesia care, see separate note  Endoscopist:  Glyn Ade, MD   Complications:  None  Estimated Blood Loss:  None    Permit:  The indications, risks, benefits and alternatives were reviewed with the patient or their decision maker who was provided an opportunity to ask questions and all questions were answered.  The specific risks of esophagogastroduodenoscopy with conscious sedation were reviewed, including but not limited to anesthetic complication, bleeding, adverse drug reaction, missed lesion, infection, IV site reactions, and intestinal perforation which would lead to the need for surgical repair.  Alternatives to EGD including radiographic imaging, observation without testing, or laboratory testing were reviewed as well as the limitations of those alternatives discussed.  After considering the options and having all their questions answered, the patient or their decision maker provided both verbal and written consent to proceed.       Procedure in Detail:  After obtaining informed consent, positioning of the patient in the left lateral decubitus position, and conduction of a pre-procedure pause or "time out" the endoscope was introduced into the mouth and advanced to the duodenum.  A careful inspection was made, and findings or interventions are described below.    Findings:   Esophagus-normal-appearing esophagus and GE junction   Stomach-copious solid food residue in the proximal gastric body limiting views of the proximal gastric body and fundus, mild edematous erythematous change  diffusely throughout the stomach with gastric antrum and body mucosal biopsies obtained  Duodenum-healthy-appearing mucosa to the D2 segment with nontargeted cold forcep mucosal biopsies obtained    Specimens:   Specimen 1-duodenal cold forcep mucosal biopsy  Specimen 2-gastric antral body cold forcep mucosal biopsy    Impression:   EGD was normal-appearing esophagus and GE junction, copious amounts of retained solid food in stomach limiting views of the gastric lining, normal-appearing duodenum           Recommendations:   Clear liquid diet today, Reglan 10 mg IV this evening and tomorrow morning to be ordered, n.p.o. after midnight and plan for repeat EGD tomorrow to better visualize the proximal stomach to assess for culprit lesions given the patient substantial epigastric abdominal pain and presentation for melena    Will contact you with biopsy results by patient letter in 7-14 business days when available  Please contact my office at [phone] for any questions or concerns.       Thank you for entrusting me with this patient's care.  Please do not hesitate to contact me with any questions or if I can be of assistance with any of your other patients' GI needs.  Signed By: Glyn Ade, MD                        January 17, 2023     Surgical assistant none.  Implants none unless specified.

## 2023-01-18 LAB — BASIC METABOLIC PANEL
Anion Gap: 5 mmol/L (ref 5–15)
BUN/Creatinine Ratio: 8 — ABNORMAL LOW (ref 12–20)
BUN: 10 MG/DL (ref 6–20)
CO2: 24 mmol/L (ref 21–32)
Calcium: 8.3 MG/DL — ABNORMAL LOW (ref 8.5–10.1)
Chloride: 109 mmol/L — ABNORMAL HIGH (ref 97–108)
Creatinine: 1.28 MG/DL — ABNORMAL HIGH (ref 0.55–1.02)
Est, Glom Filt Rate: 54 mL/min/{1.73_m2} — ABNORMAL LOW (ref >60)
Glucose: 101 mg/dL — ABNORMAL HIGH (ref 65–100)
Potassium: 4.2 mmol/L (ref 3.5–5.1)
Sodium: 138 mmol/L (ref 136–145)

## 2023-01-18 LAB — POCT GLUCOSE
POC Glucose: 106 mg/dL (ref 65–117)
POC Glucose: 120 mg/dL — ABNORMAL HIGH (ref 65–117)
POC Glucose: 124 mg/dL — ABNORMAL HIGH (ref 65–117)
POC Glucose: 137 mg/dL — ABNORMAL HIGH (ref 65–117)
POC Glucose: 92 mg/dL (ref 65–117)

## 2023-01-18 LAB — CBC WITH AUTO DIFFERENTIAL
Basophils %: 1 % (ref 0–1)
Basophils Absolute: 0.1 10*3/uL (ref 0.0–0.1)
Eosinophils %: 2 % (ref 0–7)
Eosinophils Absolute: 0.2 10*3/uL (ref 0.0–0.4)
Hematocrit: 29.8 % — ABNORMAL LOW (ref 35.0–47.0)
Hemoglobin: 9.4 g/dL — ABNORMAL LOW (ref 11.5–16.0)
Immature Granulocytes %: 0 % (ref 0.0–0.5)
Immature Granulocytes Absolute: 0 10*3/uL (ref 0.00–0.04)
Lymphocytes %: 19 % (ref 12–49)
Lymphocytes Absolute: 1.5 10*3/uL (ref 0.8–3.5)
MCH: 24.1 PG — ABNORMAL LOW (ref 26.0–34.0)
MCHC: 31.5 g/dL (ref 30.0–36.5)
MCV: 76.4 FL — ABNORMAL LOW (ref 80.0–99.0)
MPV: 9.2 FL (ref 8.9–12.9)
Monocytes %: 4 % — ABNORMAL LOW (ref 5–13)
Monocytes Absolute: 0.4 10*3/uL (ref 0.0–1.0)
Neutrophils %: 74 % (ref 32–75)
Neutrophils Absolute: 5.8 10*3/uL (ref 1.8–8.0)
Nucleated RBCs: 0 PER 100 WBC
Platelets: 424 10*3/uL — ABNORMAL HIGH (ref 150–400)
RBC: 3.9 M/uL (ref 3.80–5.20)
RDW: 16.7 % — ABNORMAL HIGH (ref 11.5–14.5)
WBC: 7.9 10*3/uL (ref 3.6–11.0)
nRBC: 0 10*3/uL (ref 0.00–0.01)

## 2023-01-18 MED ORDER — NORMAL SALINE FLUSH 0.9 % IV SOLN
0.9 | INTRAVENOUS | Status: DC | PRN
Start: 2023-01-18 — End: 2023-01-18

## 2023-01-18 MED ORDER — PROPOFOL 500 MG/50ML IV EMUL
500 | INTRAVENOUS | Status: DC | PRN
Start: 2023-01-18 — End: 2023-01-18
  Administered 2023-01-18: 14:00:00 200 via INTRAVENOUS

## 2023-01-18 MED ORDER — ESMOLOL HCL 100 MG/10ML IV SOLN
100 | INTRAVENOUS | Status: AC
Start: 2023-01-18 — End: ?

## 2023-01-18 MED ORDER — LIDOCAINE HCL (CARDIAC) 100 MG/5ML IV SOSY
100 | INTRAVENOUS | Status: AC
Start: 2023-01-18 — End: ?

## 2023-01-18 MED ORDER — NORMAL SALINE FLUSH 0.9 % IV SOLN
0.9 | Freq: Two times a day (BID) | INTRAVENOUS | Status: DC
Start: 2023-01-18 — End: 2023-01-18

## 2023-01-18 MED ORDER — SODIUM CHLORIDE 0.9 % IV SOLN
0.9 | INTRAVENOUS | Status: DC | PRN
Start: 2023-01-18 — End: 2023-01-18

## 2023-01-18 MED ORDER — SUCCINYLCHOLINE CHLORIDE 20 MG/ML IJ SOLN
20 | INTRAMUSCULAR | Status: DC | PRN
Start: 2023-01-18 — End: 2023-01-18
  Administered 2023-01-18: 14:00:00 100 via INTRAVENOUS

## 2023-01-18 MED ORDER — ESMOLOL HCL 100 MG/10ML IV SOLN
100 | INTRAVENOUS | Status: DC | PRN
Start: 2023-01-18 — End: 2023-01-18
  Administered 2023-01-18: 14:00:00 70 via INTRAVENOUS

## 2023-01-18 MED ORDER — SUCCINYLCHOLINE CHLORIDE 100 MG/5ML IV SOSY
100 | INTRAVENOUS | Status: AC
Start: 2023-01-18 — End: ?

## 2023-01-18 MED ORDER — PEG 3350-KCL-NABCB-NACL-NASULF 236 G PO SOLR
236 | Freq: Three times a day (TID) | ORAL | Status: AC
Start: 2023-01-18 — End: 2023-01-18
  Administered 2023-01-18 (×2): 2000 mL via ORAL

## 2023-01-18 MED ORDER — LORAZEPAM 0.5 MG PO TABS
0.5 MG | Freq: Four times a day (QID) | ORAL | Status: DC | PRN
Start: 2023-01-18 — End: 2023-01-20
  Administered 2023-01-18 – 2023-01-20 (×5): 0.25 mg via ORAL

## 2023-01-18 MED ORDER — PROPOFOL 500 MG/50ML IV EMUL
500 | INTRAVENOUS | Status: AC
Start: 2023-01-18 — End: ?

## 2023-01-18 MED ORDER — LIDOCAINE HCL (CARDIAC) 100 MG/5ML IV SOSY
100 | INTRAVENOUS | Status: DC | PRN
Start: 2023-01-18 — End: 2023-01-18
  Administered 2023-01-18: 14:00:00 100 via INTRAVENOUS

## 2023-01-18 MED ORDER — IRON SUCROSE 20 MG/ML IV SOLN
20 MG/ML | INTRAVENOUS | Status: AC
Start: 2023-01-18 — End: 2023-01-20
  Administered 2023-01-18 – 2023-01-20 (×3): 200 mg via INTRAVENOUS

## 2023-01-18 MED FILL — OXYCODONE HCL 5 MG PO TABS: 5 MG | ORAL | Qty: 1

## 2023-01-18 MED FILL — PANTOPRAZOLE SODIUM 40 MG IV SOLR: 40 MG | INTRAVENOUS | Qty: 40

## 2023-01-18 MED FILL — GOLYTELY 236 G PO SOLR: 236 g | ORAL | Qty: 4000

## 2023-01-18 MED FILL — ONDANSETRON HCL 4 MG/2ML IJ SOLN: 4 MG/2ML | INTRAMUSCULAR | Qty: 2

## 2023-01-18 MED FILL — METOCLOPRAMIDE HCL 5 MG/ML IJ SOLN: 5 MG/ML | INTRAMUSCULAR | Qty: 2

## 2023-01-18 MED FILL — ESMOLOL HCL 100 MG/10ML IV SOLN: 100 MG/10ML | INTRAVENOUS | Qty: 10

## 2023-01-18 MED FILL — LIDOCAINE HCL (CARDIAC) 100 MG/5ML IV SOSY: 100 MG/5ML | INTRAVENOUS | Qty: 5

## 2023-01-18 MED FILL — SUCCINYLCHOLINE CHLORIDE 100 MG/5ML IV SOSY: 100 MG/5ML | INTRAVENOUS | Qty: 5

## 2023-01-18 MED FILL — LORAZEPAM 0.5 MG PO TABS: 0.5 MG | ORAL | Qty: 1

## 2023-01-18 MED FILL — MORPHINE SULFATE 2 MG/ML IJ SOLN: 2 mg/mL | INTRAMUSCULAR | Qty: 1

## 2023-01-18 MED FILL — DIPRIVAN 500 MG/50ML IV EMUL: 500 MG/50ML | INTRAVENOUS | Qty: 50

## 2023-01-18 MED FILL — FENOFIBRATE 160 MG PO TABS: 160 MG | ORAL | Qty: 1

## 2023-01-18 MED FILL — VENOFER 20 MG/ML IV SOLN: 20 MG/ML | INTRAVENOUS | Qty: 10

## 2023-01-18 MED FILL — METOPROLOL SUCCINATE ER 25 MG PO TB24: 25 MG | ORAL | Qty: 1

## 2023-01-18 MED FILL — ATORVASTATIN CALCIUM 10 MG PO TABS: 10 MG | ORAL | Qty: 1

## 2023-01-18 MED FILL — BD POSIFLUSH 0.9 % IV SOLN: 0.9 % | INTRAVENOUS | Qty: 40

## 2023-01-18 MED FILL — AMLODIPINE BESYLATE 5 MG PO TABS: 5 MG | ORAL | Qty: 2

## 2023-01-18 NOTE — Interval H&P Note (Signed)
Update History & Physical    No change in preoperative history and physical evaluation and clinical status.  Proceed with EGD for further evaluation of epigastric pain, self-limited melena, nausea.  EGD yesterday with copious solid food throughout the proximal stomach.  Patient status post clear liquid diet yesterday, n.p.o. after midnight, 2 doses of Reglan 10 mg IV given yesterday.    Plan: The risks, benefits, expected outcome, and alternative to the recommended procedure have been discussed with the patient. Patient understands and wants to proceed with the procedure.     Electronically signed by Glyn Ade, MD on 01/18/2023 at 8:58 AM

## 2023-01-18 NOTE — Plan of Care (Signed)
Problem: Discharge Planning  Goal: Discharge to home or other facility with appropriate resources  01/18/2023 0229 by Jeralene Huff, RN  Outcome: Progressing  01/18/2023 0227 by Jeralene Huff, RN  Outcome: Progressing     Problem: Pain  Goal: Verbalizes/displays adequate comfort level or baseline comfort level  01/18/2023 0229 by Jeralene Huff, RN  Outcome: Progressing  01/18/2023 0227 by Jeralene Huff, RN  Outcome: Progressing     Problem: ABCDS Injury Assessment  Goal: Absence of physical injury  01/18/2023 0229 by Jeralene Huff, RN  Outcome: Progressing  01/18/2023 0227 by Jeralene Huff, RN  Outcome: Progressing     Problem: Skin/Tissue Integrity  Goal: Absence of new skin breakdown  Description: 1.  Monitor for areas of redness and/or skin breakdown  2.  Assess vascular access sites hourly  3.  Every 4-6 hours minimum:  Change oxygen saturation probe site  4.  Every 4-6 hours:  If on nasal continuous positive airway pressure, respiratory therapy assess nares and determine need for appliance change or resting period.  01/18/2023 0229 by Jeralene Huff, RN  Outcome: Progressing  01/18/2023 0227 by Jeralene Huff, RN  Outcome: Progressing     Problem: Safety - Adult  Goal: Free from fall injury  01/18/2023 0229 by Jeralene Huff, RN  Outcome: Progressing  01/18/2023 0227 by Jeralene Huff, RN  Outcome: Progressing     Problem: Chronic Conditions and Co-morbidities  Goal: Patient's chronic conditions and co-morbidity symptoms are monitored and maintained or improved  01/18/2023 0229 by Jeralene Huff, RN  Outcome: Progressing  01/18/2023 0227 by Jeralene Huff, RN  Outcome: Progressing

## 2023-01-18 NOTE — Other (Signed)
TRANSFER - IN REPORT:    Verbal report received from [facility] RN on Melinda [name]  being received from 415 for ordered procedure      Report consisted of patient's Situation, Background, Assessment and   Recommendations(SBAR).     Information from the following report(s) Recent Results, Alarm Parameters, Pre Procedure Checklist, Procedure Verification, and Quality Measures was reviewed with the receiving nurse.    Opportunity for questions and clarification was provided.      Assessment completed upon patient's arrival to unit and care assumed.

## 2023-01-18 NOTE — Care Coordination-Inpatient (Signed)
01/18/2023  11:29 AM  Care Management Progress Note      ICD-10-CM    1. Septicemia (HCC)  A41.9       2. Urinary tract infection without hematuria, site unspecified  N39.0       3. Abdominal pain, unspecified abdominal location  R10.9       4. Anemia, unspecified type  D64.9           RUR:  12%  Risk Level: [x] Low [] Moderate [] High    Transition of care plan:  Discharge pending medical clearance.   Home. No CM needs indicated.   Outpatient follow-up.  Pt's family to transport.       01/14/23 1105   Service Assessment   Patient Orientation Alert and Oriented   Cognition Alert   History Provided By Patient;Child/Family   Primary Caregiver Self   Accompanied By/Relationship daughter Toney Reil   Support Systems Spouse/Significant Other;Children   Patient's Healthcare Decision Maker is: Armed forces operational officer Next of Kin  (husband Bearl Mulberry [phone])   PCP Verified by CM Yes  (Dr. Katrinka Blazing)   Last Visit to PCP Within last 3 months   Prior Functional Level Independent in ADLs/IADLs   Can patient return to prior living arrangement Yes   Ability to make needs known: Good   Family able to assist with home care needs: Yes   Financial Resources Other (Comment)  (pt applied for medicaid, care fund info given)   Programmer, applications None   Social/Functional History   Lives With Spouse;Daughter   Type of Home Trailer   Home Layout One level   Home Access Stairs to enter with rails   Entrance Stairs - Number of Steps 5   Bathroom Equipment None   Home Equipment None   ADL Assistance Independent   Mudlogger Yes   Ambulation Assistance Independent   Transfer Assistance Independent   Active Driver No   Patient's Driver Info daughter [place]   Discharge Planning   Type of Residence Trailer/Mobile Home   Living Arrangements Spouse/Significant Other;Children   Current Services Prior To Admission None   Potential Assistance Needed N/A   DME Ordered? No   Potential Assistance Purchasing Medications No    Type of Home Care Services None   Patient expects to be discharged to: Trailer/mobile home   One/Two Story Residence One story   Services At/After Discharge   [employer] Information Provided? No   Mode of Transport at Discharge Other (see comment)  (daughter daisy)

## 2023-01-18 NOTE — Plan of Care (Signed)
Problem: Discharge Planning  Goal: Discharge to home or other facility with appropriate resources  01/18/2023 1202 by Theadora Rama, RN  Outcome: HH/HSPC Progressing  01/18/2023 0229 by Jeralene Huff, RN  Outcome: Progressing  01/18/2023 0227 by Jeralene Huff, RN  Outcome: Progressing  Flowsheets (Taken 01/17/2023 2157)  Discharge to home or other facility with appropriate resources: Identify barriers to discharge with patient and caregiver     Problem: Pain  Goal: Verbalizes/displays adequate comfort level or baseline comfort level  01/18/2023 1202 by Theadora Rama, RN  Outcome: HH/HSPC Progressing  01/18/2023 0229 by Jeralene Huff, RN  Outcome: Progressing  01/18/2023 0227 by Jeralene Huff, RN  Outcome: Progressing     Problem: ABCDS Injury Assessment  Goal: Absence of physical injury  01/18/2023 1202 by Theadora Rama, RN  Outcome: HH/HSPC Progressing  01/18/2023 0229 by Jeralene Huff, RN  Outcome: Progressing  01/18/2023 0227 by Jeralene Huff, RN  Outcome: Progressing     Problem: Skin/Tissue Integrity  Goal: Absence of new skin breakdown  Description: 1.  Monitor for areas of redness and/or skin breakdown  2.  Assess vascular access sites hourly  3.  Every 4-6 hours minimum:  Change oxygen saturation probe site  4.  Every 4-6 hours:  If on nasal continuous positive airway pressure, respiratory therapy assess nares and determine need for appliance change or resting period.  01/18/2023 1202 by Theadora Rama, RN  Outcome: HH/HSPC Progressing  01/18/2023 0229 by Jeralene Huff, RN  Outcome: Progressing  01/18/2023 0227 by Jeralene Huff, RN  Outcome: Progressing     Problem: Safety - Adult  Goal: Free from fall injury  01/18/2023 1202 by Theadora Rama, RN  Outcome: HH/HSPC Progressing  01/18/2023 0229 by Jeralene Huff, RN  Outcome: Progressing  01/18/2023 0227 by Jeralene Huff, RN  Outcome: Progressing     Problem: Chronic Conditions and Co-morbidities  Goal: Patient's chronic conditions and co-morbidity symptoms are  monitored and maintained or improved  01/18/2023 1202 by Theadora Rama, RN  Outcome: HH/HSPC Progressing  01/18/2023 0229 by Jeralene Huff, RN  Outcome: Progressing  01/18/2023 0227 by Jeralene Huff, RN  Outcome: Progressing  Flowsheets (Taken 01/17/2023 2157)  Care Plan - Patient's Chronic Conditions and Co-Morbidity Symptoms are Monitored and Maintained or Improved: Monitor and assess patient's chronic conditions and comorbid symptoms for stability, deterioration, or improvement

## 2023-01-18 NOTE — Plan of Care (Signed)
Problem: Discharge Planning  Goal: Discharge to home or other facility with appropriate resources  Outcome: Progressing     Problem: Pain  Goal: Verbalizes/displays adequate comfort level or baseline comfort level  Outcome: Progressing     Problem: ABCDS Injury Assessment  Goal: Absence of physical injury  Outcome: Progressing     Problem: Skin/Tissue Integrity  Goal: Absence of new skin breakdown  Description: 1.  Monitor for areas of redness and/or skin breakdown  2.  Assess vascular access sites hourly  3.  Every 4-6 hours minimum:  Change oxygen saturation probe site  4.  Every 4-6 hours:  If on nasal continuous positive airway pressure, respiratory therapy assess nares and determine need for appliance change or resting period.  Outcome: Progressing     Problem: Safety - Adult  Goal: Free from fall injury  Outcome: Progressing     Problem: Chronic Conditions and Co-morbidities  Goal: Patient's chronic conditions and co-morbidity symptoms are monitored and maintained or improved  Outcome: Progressing

## 2023-01-18 NOTE — Anesthesia Post-Procedure Evaluation (Signed)
Department of Anesthesiology  Postprocedure Note    Patient: Melinda [name]  MRN: [id number]  Birthdate: 11-30-1980  Date of evaluation: 01/18/2023    Procedure Summary       Date: 01/18/23 Room / Location: [facility] 02 / [facility]    Anesthesia Start: 0932 Anesthesia Stop: 0947    Procedure: ESOPHAGOGASTRODUODENOSCOPY (Upper GI Region) Diagnosis:       Melena      (Melena [K92.1])    Surgeons: Glyn Ade, MD Responsible Provider: Joanell Rising, MD    Anesthesia Type: general ASA Status: 2            Anesthesia Type: No value filed.    Aldrete Phase I: Aldrete Score: 10    Aldrete Phase II: Aldrete Score: 10    Anesthesia Post Evaluation    Patient location during evaluation: PACU  Level of consciousness: awake  Airway patency: patent  Nausea & Vomiting: no vomiting and no nausea  Cardiovascular status: hemodynamically stable  Respiratory status: acceptable  Hydration status: stable  Pain management: adequate    No notable events documented.

## 2023-01-18 NOTE — Progress Notes (Signed)
[facility]  [address] [place], [place] [address]  [phone]      Hospitalist Progress Note      NAME: Melinda [name]   DOB:  [birth year]  MRM:  [id number]    Date of service: 01/18/2023        Assessment and Plan:     Epigastric abdominal pain/melena POA:  Unclear etiology. EGD 4/08 w/ retained food in stomach; plan for repeat EGD 4/09. Plan for colonoscopy 4/10. Continue PPI IV twice daily.  GI following     Microcytic anemia POA:  Possible GI blood loss.  Iron levels low; start Iv iron. Patient stated that in the past, she noted melena.  Work up as above     SIRS POA: Leukocytosis and Tachycardia. Lactic acid elevated.  Resolved. Blood cultures remain negative. Was on Abx, which was discontinued. UC: Staph epidermidis, which is skin flora and a contamination.        Abnormal UA POA: Not UTI. UA with Many epithelial cells and UC: staph epidermidis. Likely contamination. Stopped Abx. No UTI on admission.      AKI (acute kidney injury) (HCC) vs CKD POA:  Holding lisinopril and hydrochlorothiazide. Monitor Cr and cont IVF       DM (diabetes mellitus) (HCC).  Continue home insulin and cover with sliding      HTN (hypertension).  Holding hydrochlorothiazide and lisinopril due to renal insufficiency. Continue amlodipine and metoprolol      Hyperlipidemia.  On statin         Subjective:     Chief Complaint:: Patient was seen and examined as a follow up for abdominal pain.  Chart was reviewed.  abdominal pain improved. No overt bleeding           Objective:     Last 24hrs VS reviewed since prior progress note. Most recent are:    Vitals:    01/18/23 0957   BP:    Pulse: (!) 103   Resp: 14   Temp:    SpO2: 99%     SpO2 Readings from Last 6 Encounters:   01/18/23 99%   06/29/22 100%   05/15/22 98%   02/16/22 99%          Intake/Output Summary (Last 24 hours) at 01/18/2023 1017  Last data filed at 01/17/2023 1221  Gross per 24 hour   Intake 100 ml   Output --   Net 100 ml           Physical Exam:    Gen:  Well-developed, well-nourished, in no acute distress  HEENT:  Pink conjunctivae, PERRL, hearing intact to voice  Resp:  No accessory muscle use, clear breath sounds without wheezes rales or rhonchi  Card:  No murmurs, normal S1, S2 without thrills, bruits or peripheral edema  Abd:  Soft, non-tender, non-distended, normoactive bowel sounds are present  Lymph:  No cervical or inguinal adenopathy  Musc:  No cyanosis or clubbing  Skin:  No rashes or ulcers, skin turgor is good  Neuro:  Cranial nerves are grossly intact, no focal motor weakness, follows commands appropriately  Psych: fair insight   __________________________________________________________________  Medications Reviewed: (see below)  Medications:     Current Facility-Administered Medications   Medication Dose Route Frequency    sodium chloride flush 0.9 % injection 5-40 mL  5-40 mL IntraVENous 2 times per day    sodium chloride flush 0.9 % injection 5-40 mL  5-40 mL IntraVENous PRN  0.9 % sodium chloride infusion  25 mL IntraVENous PRN    polyethylene glycol (GoLYTELY) solution 2,000 mL  2,000 mL Oral q8h    metoclopramide (REGLAN) injection 10 mg  10 mg IntraVENous BID    glucose chewable tablet 16 g  4 tablet Oral PRN    dextrose bolus 10% 125 mL  125 mL IntraVENous PRN    Or    dextrose bolus 10% 250 mL  250 mL IntraVENous PRN    glucagon injection 1 mg  1 mg SubCUTAneous PRN    dextrose 10 % infusion   IntraVENous Continuous PRN    0.9 % sodium chloride infusion   IntraVENous PRN    prochlorperazine (COMPAZINE) injection 10 mg  10 mg IntraVENous Q6H PRN    amLODIPine (NORVASC) tablet 10 mg  10 mg Oral Daily    fenofibrate (TRIGLIDE) tablet 160 mg  160 mg Oral Daily    metoprolol succinate (TOPROL XL) extended release tablet 25 mg  25 mg Oral Daily    atorvastatin (LIPITOR) tablet 10 mg  10 mg Oral Daily    sodium chloride flush 0.9 % injection 5-40 mL  5-40 mL IntraVENous 2 times per day    sodium chloride flush 0.9 %  injection 5-40 mL  5-40 mL IntraVENous PRN    0.9 % sodium chloride infusion   IntraVENous PRN    potassium chloride (KLOR-CON) extended release tablet 40 mEq  40 mEq Oral PRN    Or    potassium bicarb-citric acid (EFFER-K) effervescent tablet 40 mEq  40 mEq Oral PRN    Or    potassium chloride 10 mEq/100 mL IVPB (Peripheral Line)  10 mEq IntraVENous PRN    magnesium sulfate 2000 mg in 50 mL IVPB premix  2,000 mg IntraVENous PRN    ondansetron (ZOFRAN-ODT) disintegrating tablet 4 mg  4 mg Oral Q8H PRN    Or    ondansetron (ZOFRAN) injection 4 mg  4 mg IntraVENous Q6H PRN    polyethylene glycol (GLYCOLAX) packet 17 g  17 g Oral Daily PRN    acetaminophen (TYLENOL) tablet 650 mg  650 mg Oral Q6H PRN    Or    acetaminophen (TYLENOL) suppository 650 mg  650 mg Rectal Q6H PRN    insulin lispro (HUMALOG) injection vial 0-8 Units  0-8 Units SubCUTAneous TID WC    insulin lispro (HUMALOG) injection vial 0-4 Units  0-4 Units SubCUTAneous Nightly    morphine (PF) injection 2 mg  2 mg IntraVENous Q4H PRN    oxyCODONE (ROXICODONE) immediate release tablet 5 mg  5 mg Oral Q4H PRN    [Held by provider] insulin glargine (LANTUS) injection vial 30 Units  30 Units SubCUTAneous Daily    ondansetron (ZOFRAN) injection 4 mg  4 mg IntraVENous q8h    prochlorperazine (COMPAZINE) tablet 5 mg  5 mg Oral Q6H PRN    pantoprazole (PROTONIX) 40 mg in sodium chloride (PF) 0.9 % 10 mL injection  40 mg IntraVENous Q12H    hydrALAZINE (APRESOLINE) injection 10 mg  10 mg IntraVENous Q8H PRN        Lab Data Reviewed: (see below)  Lab Review:     Recent Labs     01/16/23  0140 01/17/23  0407 01/18/23  0620   WBC 7.0 9.3 7.9   HGB 9.2* 8.6* 9.4*   HCT 28.8* 27.3* 29.8*   PLT 423* 418* 424*       Recent Labs     01/16/23  0140 01/17/23  0407 01/18/23  0620   NA 140 139 138   K 3.5 3.4* 4.2   CL 109* 107 109*   CO2 28 27 24    BUN 14 10 10        No results found for: "GLUCPOC"  No results for input(s): "PH", "PCO2", "PO2", "HCO3", "FIO2" in the last 72  hours.  No results for input(s): "INR" in the last 72 hours.  @MICRORESULTS @    I have reviewed notes of prior 24hr.    Other pertinent lab:     Total time: -35- minutes. I personally saw and examined the patient during this time period.  Greater than 50% of this time was spent in counseling and coordination of care.    I personally reviewed chart, notes, data and current medications in the medical record.  I have personally examined and treated the patient at bedside during this period.                 Care Plan discussed with: Patient, Family, Nursing Staff, and >50% of time spent in counseling and coordination of care    Discussed:  Care Plan    Prophylaxis:  SCD's    Disposition:  Home w/Family           ___________________________________________________    Attending Physician: Horald Chestnut, DO

## 2023-01-18 NOTE — Other (Signed)
TRANSFER - OUT REPORT:    Verbal report given to Lexi, RN  on Melinda [name]  being transferred to 4th floor, bed 405  for routine progression of patient care       Report consisted of patient's Situation, Background, Assessment and   Recommendations(SBAR).     Information from the following report(s) Nurse Handoff Report and Recent Results was reviewed with the receiving nurse.           Lines:   Peripheral IV 01/16/23 Left;Lateral Forearm (Active)   Site Assessment Clean, dry & intact 01/18/23 0950   Line Status Infusing 01/18/23 0950   Line Care Connections checked and tightened 01/18/23 0950   Phlebitis Assessment No symptoms 01/18/23 0950   Infiltration Assessment 0 01/18/23 0950   Alcohol Cap Used Yes 01/18/23 0950   Dressing Status Clean, dry & intact 01/18/23 0950   Dressing Type Transparent 01/18/23 0950        Opportunity for questions and clarification was provided.      Patient transported with:  [employer]

## 2023-01-18 NOTE — Op Note (Signed)
.                           [facility]  [place] [facility] [place], [place]  [phone]      January 18, 2023    Esophagogastroduodenoscopy (EGD) Procedure Note  Melinda [name]  DOB: [birth year]  [name] Medical Record Number: [id number]      Indications:   melena, epigastric pain, retained food in stomach yesterday   Referring Physician:  No primary care provider on file.  Anesthesia/Sedation: Monitored anesthesia care, see separate note, GETA  Endoscopist:  Glyn Ade, MD   Complications:  None  Estimated Blood Loss:  None    Permit:  The indications, risks, benefits and alternatives were reviewed with the patient or their decision maker who was provided an opportunity to ask questions and all questions were answered.  The specific risks of esophagogastroduodenoscopy with conscious sedation were reviewed, including but not limited to anesthetic complication, bleeding, adverse drug reaction, missed lesion, infection, IV site reactions, and intestinal perforation which would lead to the need for surgical repair.  Alternatives to EGD including radiographic imaging, observation without testing, or laboratory testing were reviewed as well as the limitations of those alternatives discussed.  After considering the options and having all their questions answered, the patient or their decision maker provided both verbal and written consent to proceed.       Procedure in Detail:  After obtaining informed consent, positioning of the patient in the left lateral decubitus position, and conduction of a pre-procedure pause or "time out" the endoscope was introduced into the mouth and advanced to the duodenum.  A careful inspection was made, and findings or interventions are described below.    Findings:   Esophagus-normal-appearing esophagus and GE junction   Stomach-normal-appearing stomach  Duodenum-normal examined duodenum to the D2 segment    Specimens: none  See  above    Impression:   Unremarkable upper endoscopy; gastric and duodenal biopsies for iron deficiency previously obtained yesterday           Recommendations:   Reviewed with the patient CT scan of the abdomen pelvis reveals what appears to be an apple core lesion in the right colon in the setting of profound iron deficiency anemia and melena with no clear source identified on today's upper endoscopy or yesterday's exam we will proceed with colonoscopy tomorrow as follows  Clear liquid diet today, n.p.o. after midnight  GoLytely 2 L at noon today and an additional 2 L at 6 PM today  Colonoscopy tomorrow in the endoscopy suite    Will contact you with biopsy results by patient letter in 7-14 business days when available  Please contact my office at [phone] for any questions or concerns.       Thank you for entrusting me with this patient's care.  Please do not hesitate to contact me with any questions or if I can be of assistance with any of your other patients' GI needs.  Signed By: Glyn Ade, MD                        January 18, 2023     Surgical assistant none.  Implants none unless specified.

## 2023-01-18 NOTE — Other (Signed)
Nursing report given to Lavina Hamman, RN. Patient tolerated procedure without problems. Abdomen soft and patient arousable and voices no complaints Report received from Jeanella Cara, CRNA, see anesthesia note. Patient transported to endoscopy recovery area.  Scope precleaned at bedside by [facility].

## 2023-01-18 NOTE — Anesthesia Pre-Procedure Evaluation (Addendum)
Department of Anesthesiology  Preprocedure Note       Name:  Melinda [name]   Age:  42 y.o.  DOB:  [birth year]                                          MRN:  [id number]         Date:  01/18/2023      Surgeon: Moishe SpiceSurgeon(s):  Glyn AdeHoque, Rafaz, MD    Procedure: Procedure(s):  ESOPHAGOGASTRODUODENOSCOPY    Medications prior to admission:   Prior to Admission medications    Medication Sig Start Date End Date Taking? Authorizing Provider   spironolactone (ALDACTONE) 25 MG tablet Take 1 tablet by mouth daily   Yes [provider]   lisinopril (PRINIVIL;ZESTRIL) 40 MG tablet Take 1 tablet by mouth daily   Yes [provider]   amLODIPine (NORVASC) 10 MG tablet Take 1 tablet by mouth daily   Yes [provider]   hydroCHLOROthiazide (HYDRODIURIL) 25 MG tablet Take 1 tablet by mouth daily   Yes [provider]   insulin 70-30 (NOVOLIN 70/30) (70-30) 100 UNIT per ML injection vial Inject 15 Units into the skin 2 times daily 07/10/21   Automatic Reconciliation, Ar       Current medications:    Current Facility-Administered Medications   Medication Dose Route Frequency Provider Last Rate Last Admin   . sodium chloride flush 0.9 % injection 5-40 mL  5-40 mL IntraVENous 2 times per day Hoque, Rafaz, MD       . sodium chloride flush 0.9 % injection 5-40 mL  5-40 mL IntraVENous PRN Hoque, Rafaz, MD       . 0.9 % sodium chloride infusion  25 mL IntraVENous PRN Hoque, Rafaz, MD       . metoclopramide (REGLAN) injection 10 mg  10 mg IntraVENous BID Bright, Abigail J, PA-C   10 mg at 01/17/23 2030   . glucose chewable tablet 16 g  4 tablet Oral PRN Tefera, Mesfin A, MD       . dextrose bolus 10% 125 mL  125 mL IntraVENous PRN Tefera, Mesfin A, MD        Or   . dextrose bolus 10% 250 mL  250 mL IntraVENous PRN Tefera, Mesfin A, MD       . glucagon injection 1 mg  1 mg SubCUTAneous PRN Tefera, Mesfin A, MD       . dextrose 10 % infusion   IntraVENous Continuous PRN Tefera, Mesfin A, MD       . 0.9 % sodium  chloride infusion   IntraVENous PRN Tefera, Mesfin A, MD       . prochlorperazine (COMPAZINE) injection 10 mg  10 mg IntraVENous Q6H PRN Tefera, Mesfin A, MD   10 mg at 01/16/23 0843   . amLODIPine (NORVASC) tablet 10 mg  10 mg Oral Daily Tefera, Mesfin A, MD   10 mg at 01/17/23 0834   . fenofibrate (TRIGLIDE) tablet 160 mg  160 mg Oral Daily Tefera, Mesfin A, MD   160 mg at 01/17/23 0834   . metoprolol succinate (TOPROL XL) extended release tablet 25 mg  25 mg Oral Daily Tefera, Mesfin A, MD   25 mg at 01/17/23 0834   . atorvastatin (LIPITOR) tablet 10 mg  10 mg Oral Daily Tefera, Mesfin A, MD   10 mg at 01/17/23 0835   .  sodium chloride flush 0.9 % injection 5-40 mL  5-40 mL IntraVENous 2 times per day Tefera, Mesfin A, MD   10 mL at 01/17/23 2031   . sodium chloride flush 0.9 % injection 5-40 mL  5-40 mL IntraVENous PRN Tefera, Mesfin A, MD       . 0.9 % sodium chloride infusion   IntraVENous PRN Tefera, Mesfin A, MD       . potassium chloride (KLOR-CON) extended release tablet 40 mEq  40 mEq Oral PRN Tefera, Mesfin A, MD        Or   . potassium bicarb-citric acid (EFFER-K) effervescent tablet 40 mEq  40 mEq Oral PRN Tefera, Mesfin A, MD        Or   . potassium chloride 10 mEq/100 mL IVPB (Peripheral Line)  10 mEq IntraVENous PRN Tefera, Mesfin A, MD       . magnesium sulfate 2000 mg in 50 mL IVPB premix  2,000 mg IntraVENous PRN Tefera, Mesfin A, MD       . ondansetron (ZOFRAN-ODT) disintegrating tablet 4 mg  4 mg Oral Q8H PRN Tefera, Mesfin A, MD        Or   . ondansetron (ZOFRAN) injection 4 mg  4 mg IntraVENous Q6H PRN Tefera, Mesfin A, MD   4 mg at 01/15/23 0839   . polyethylene glycol (GLYCOLAX) packet 17 g  17 g Oral Daily PRN Tefera, Mesfin A, MD       . acetaminophen (TYLENOL) tablet 650 mg  650 mg Oral Q6H PRN Tefera, Mesfin A, MD   650 mg at 01/17/23 0834    Or   . acetaminophen (TYLENOL) suppository 650 mg  650 mg Rectal Q6H PRN Tefera, Mesfin A, MD       . insulin lispro (HUMALOG) injection vial 0-8  Units  0-8 Units SubCUTAneous TID WC Tefera, Mesfin A, MD   2 Units at 01/15/23 1005   . insulin lispro (HUMALOG) injection vial 0-4 Units  0-4 Units SubCUTAneous Nightly Tefera, Mesfin A, MD       . morphine (PF) injection 2 mg  2 mg IntraVENous Q4H PRN Tefera, Mesfin A, MD   2 mg at 01/17/23 2030   . oxyCODONE (ROXICODONE) immediate release tablet 5 mg  5 mg Oral Q4H PRN Tefera, Mesfin A, MD   5 mg at 01/17/23 1603   . [Held by provider] insulin glargine (LANTUS) injection vial 30 Units  30 Units SubCUTAneous Daily Jaci Lazier, MD   30 Units at 01/16/23 [phone]   . ondansetron (ZOFRAN) injection 4 mg  4 mg IntraVENous q8h Jaci Lazier, MD   4 mg at 01/18/23 0617   . prochlorperazine (COMPAZINE) tablet 5 mg  5 mg Oral Q6H PRN Jaci Lazier, MD   5 mg at 01/15/23 0334   . pantoprazole (PROTONIX) 40 mg in sodium chloride (PF) 0.9 % 10 mL injection  40 mg IntraVENous Q12H Bright, Abigail J, PA-C   40 mg at 01/17/23 2030   . hydrALAZINE (APRESOLINE) injection 10 mg  10 mg IntraVENous Q8H PRN Jaci Lazier, MD   10 mg at 01/15/23 2778       Allergies:  No Known Allergies    Problem List:    Patient Active Problem List   Diagnosis Code   . Nausea and vomiting R11.2   . Sepsis (HCC) A41.9   . Abdominal pain R10.9   . UTI (urinary tract infection) N39.0   . AKI (acute kidney  injury) (HCC) N17.9   . Leukocytosis D72.829   . Elevated lactic acid level R79.89   . Tachycardia R00.0   . DM (diabetes mellitus) (HCC) E11.9   . HTN (hypertension) I10   . Hyperlipidemia E78.5       Past Medical History:        Diagnosis Date   . High blood pressure    . High cholesterol    . Type 2 diabetes mellitus (HCC)     Controled       Past Surgical History:        Procedure Laterality Date   . OTHER SURGICAL HISTORY Left     Rentia attachment   . UPPER GASTROINTESTINAL ENDOSCOPY N/A 01/17/2023    ESOPHAGOGASTRODUODENOSCOPY performed by Glyn Ade, MD at [facility] ENDOSCOPY   . UPPER GASTROINTESTINAL ENDOSCOPY N/A 01/17/2023     ESOPHAGOGASTRODUODENOSCOPY BIOPSY performed by Glyn Ade, MD at [facility] ENDOSCOPY       Social History:    Social History     Tobacco Use   . Smoking status: Never   . Smokeless tobacco: Never   Substance Use Topics   . Alcohol use: Never                                Counseling given: Not Answered      Vital Signs (Current):   Vitals:    01/18/23 0004 01/18/23 0416 01/18/23 0745 01/18/23 0907   BP: (!) 147/81 136/87 (!) 150/92 (!) 184/90   Pulse: 85 91 99 100   Resp: 16 18 18 21    Temp: 98.2 F (36.8 C) 97.3 F (36.3 C) 98.2 F (36.8 C) 98.3 F (36.8 C)   TempSrc: Oral Oral Oral Oral   SpO2: 97% 100% 97% 98%   Weight:    65.8 kg (145 lb)   Height:    1.499 m (4\' 11" )                                              BP Readings from Last 3 Encounters:   01/18/23 (!) 184/90   06/29/22 (!) 165/97   05/15/22 (!) 183/83       NPO Status: Time of last liquid consumption: 2200                        Time of last solid consumption: 1700                        Date of last liquid consumption: 01/17/23                        Date of last solid food consumption: 01/16/23    BMI:   Wt Readings from Last 3 Encounters:   01/18/23 65.8 kg (145 lb)   06/28/22 59.9 kg (132 lb)   05/15/22 62.6 kg (138 lb)     Body mass index is 29.29 kg/m.    CBC:   Lab Results   Component Value Date/Time    WBC 7.9 01/18/2023 06:20 AM    RBC 3.90 01/18/2023 06:20 AM    HGB 9.4 01/18/2023 06:20 AM    HCT 29.8 01/18/2023 06:20 AM    MCV 76.4 01/18/2023 06:20 AM  RDW 16.7 01/18/2023 06:20 AM    PLT 424 01/18/2023 06:20 AM       CMP:   Lab Results   Component Value Date/Time    NA 138 01/18/2023 06:20 AM    K 4.2 01/18/2023 06:20 AM    CL 109 01/18/2023 06:20 AM    CO2 24 01/18/2023 06:20 AM    BUN 10 01/18/2023 06:20 AM    CREATININE 1.28 01/18/2023 06:20 AM    GFRAA >60 07/10/2021 12:01 AM    AGRATIO 0.7 01/15/2023 04:47 AM    AGRATIO 0.8 10/22/2021 10:00 AM    LABGLOM 54 01/18/2023 06:20 AM    GLUCOSE 101 01/18/2023 06:20 AM    PROT 5.5  01/15/2023 04:47 AM    CALCIUM 8.3 01/18/2023 06:20 AM    BILITOT 0.6 01/15/2023 04:47 AM    ALKPHOS 87 01/15/2023 04:47 AM    ALKPHOS 98 10/22/2021 10:00 AM    AST 13 01/15/2023 04:47 AM    ALT 11 01/15/2023 04:47 AM       POC Tests:   Recent Labs     01/18/23  0732   POCGLU 106         Coags: No results found for: "PROTIME", "INR", "APTT"    HCG (If Applicable):   Lab Results   Component Value Date    PREGTESTUR Negative 01/14/2023        ABGs: No results found for: "PHART", "PO2ART", "PCO2ART", "HCO3ART", "BEART", "O2SATART"     Type & Screen (If Applicable):  No results found for: "LABABO", "LABRH"    Drug/Infectious Status (If Applicable):  No results found for: "HIV", "HEPCAB"    COVID-19 Screening (If Applicable):   Lab Results   Component Value Date/Time    COVID19 Not detected 06/28/2022 09:52 PM           Anesthesia Evaluation  Patient summary reviewed and Nursing notes reviewed  Airway: Mallampati: II  TM distance: >3 FB   Neck ROM: full  Mouth opening: > = 3 FB   Dental:    (+) poor dentition      Pulmonary:Negative Pulmonary ROS breath sounds clear to auscultation                             Cardiovascular:  Exercise tolerance: good (>4 METS)  (+) hypertension:        Rhythm: regular  Rate: normal                    Neuro/Psych:   Negative Neuro/Psych ROS              GI/Hepatic/Renal:            ROS comment: Had solid food in stomach yesterday.   Endo/Other:    (+) Diabetes.                 Abdominal:             Vascular: negative vascular ROS.         Other Findings:       Anesthesia Plan      general     ASA 2       Induction: intravenous and rapid sequence.      Anesthetic plan and risks discussed with patient.      Plan discussed with CRNA.                Joanell Rising, MD  01/18/2023

## 2023-01-19 LAB — CBC WITH AUTO DIFFERENTIAL
Basophils %: 1 % (ref 0–1)
Basophils Absolute: 0.1 10*3/uL (ref 0.0–0.1)
Eosinophils %: 6 % (ref 0–7)
Eosinophils Absolute: 0.4 10*3/uL (ref 0.0–0.4)
Hematocrit: 25.9 % — ABNORMAL LOW (ref 35.0–47.0)
Hemoglobin: 8.3 g/dL — ABNORMAL LOW (ref 11.5–16.0)
Immature Granulocytes %: 0 % (ref 0.0–0.5)
Immature Granulocytes Absolute: 0 10*3/uL (ref 0.00–0.04)
Lymphocytes %: 29 % (ref 12–49)
Lymphocytes Absolute: 2.1 10*3/uL (ref 0.8–3.5)
MCH: 24.3 PG — ABNORMAL LOW (ref 26.0–34.0)
MCHC: 32 g/dL (ref 30.0–36.5)
MCV: 76 FL — ABNORMAL LOW (ref 80.0–99.0)
MPV: 8.7 FL — ABNORMAL LOW (ref 8.9–12.9)
Monocytes %: 10 % (ref 5–13)
Monocytes Absolute: 0.7 10*3/uL (ref 0.0–1.0)
Neutrophils %: 54 % (ref 32–75)
Neutrophils Absolute: 3.8 10*3/uL (ref 1.8–8.0)
Nucleated RBCs: 0 PER 100 WBC
Platelets: 377 10*3/uL (ref 150–400)
RBC: 3.41 M/uL — ABNORMAL LOW (ref 3.80–5.20)
RDW: 17.2 % — ABNORMAL HIGH (ref 11.5–14.5)
WBC: 7 10*3/uL (ref 3.6–11.0)
nRBC: 0 10*3/uL (ref 0.00–0.01)

## 2023-01-19 LAB — BASIC METABOLIC PANEL
Anion Gap: 5 mmol/L (ref 5–15)
BUN/Creatinine Ratio: 7 — ABNORMAL LOW (ref 12–20)
BUN: 9 MG/DL (ref 6–20)
CO2: 27 mmol/L (ref 21–32)
Calcium: 7.9 MG/DL — ABNORMAL LOW (ref 8.5–10.1)
Chloride: 108 mmol/L (ref 97–108)
Creatinine: 1.28 MG/DL — ABNORMAL HIGH (ref 0.55–1.02)
Est, Glom Filt Rate: 54 mL/min/{1.73_m2} — ABNORMAL LOW (ref >60)
Glucose: 96 mg/dL (ref 65–100)
Potassium: 3.8 mmol/L (ref 3.5–5.1)
Sodium: 140 mmol/L (ref 136–145)

## 2023-01-19 LAB — POCT GLUCOSE
POC Glucose: 124 mg/dL — ABNORMAL HIGH (ref 65–117)
POC Glucose: 93 mg/dL (ref 65–117)
POC Glucose: 96 mg/dL (ref 65–117)
POC Glucose: 289 mg/dL — ABNORMAL HIGH (ref 65–117)

## 2023-01-19 LAB — EXTRA TUBES HOLD

## 2023-01-19 MED ORDER — SODIUM CHLORIDE 0.9 % IV SOLN
0.9 % | INTRAVENOUS | Status: DC | PRN
  Administered 2023-01-19: 15:00:00 via INTRAVENOUS

## 2023-01-19 MED ORDER — PROPOFOL 500 MG/50ML IV EMUL
500 MG/50ML | INTRAVENOUS | Status: DC | PRN
  Administered 2023-01-19: 15:00:00 50 via INTRAVENOUS
  Administered 2023-01-19: 15:00:00 180 via INTRAVENOUS

## 2023-01-19 MED ORDER — LIDOCAINE HCL (CARDIAC) 100 MG/5ML IV SOSY
100 | INTRAVENOUS | Status: AC
Start: 2023-01-19 — End: ?

## 2023-01-19 MED ORDER — PROPOFOL 500 MG/50ML IV EMUL
500 | INTRAVENOUS | Status: AC
Start: 2023-01-19 — End: ?

## 2023-01-19 MED ORDER — METOPROLOL SUCCINATE ER 50 MG PO TB24
50 MG | Freq: Every day | ORAL | Status: AC
Start: 2023-01-19 — End: 2023-01-20

## 2023-01-19 MED ORDER — METOPROLOL SUCCINATE ER 25 MG PO TB24
25 | Freq: Once | ORAL | Status: AC
Start: 2023-01-19 — End: 2023-01-19
  Administered 2023-01-19: 16:00:00 25 mg via ORAL

## 2023-01-19 MED ORDER — LIDOCAINE HCL (CARDIAC) 100 MG/5ML IV SOSY
100 MG/5ML | INTRAVENOUS | Status: DC | PRN
  Administered 2023-01-19: 15:00:00 100 via INTRAVENOUS

## 2023-01-19 MED FILL — PANTOPRAZOLE SODIUM 40 MG IV SOLR: 40 MG | INTRAVENOUS | Qty: 40

## 2023-01-19 MED FILL — MORPHINE SULFATE 2 MG/ML IJ SOLN: 2 mg/mL | INTRAMUSCULAR | Qty: 1

## 2023-01-19 MED FILL — ACETAMINOPHEN 325 MG PO TABS: 325 MG | ORAL | Qty: 2

## 2023-01-19 MED FILL — LORAZEPAM 0.5 MG PO TABS: 0.5 MG | ORAL | Qty: 1

## 2023-01-19 MED FILL — LIDOCAINE HCL (CARDIAC) 100 MG/5ML IV SOSY: 100 MG/5ML | INTRAVENOUS | Qty: 5

## 2023-01-19 MED FILL — ONDANSETRON HCL 4 MG/2ML IJ SOLN: 4 MG/2ML | INTRAMUSCULAR | Qty: 2

## 2023-01-19 MED FILL — INSULIN LISPRO 100 UNIT/ML IJ SOLN: 100 UNIT/ML | INTRAMUSCULAR | Qty: 4

## 2023-01-19 MED FILL — METOPROLOL SUCCINATE ER 25 MG PO TB24: 25 MG | ORAL | Qty: 1

## 2023-01-19 MED FILL — AMLODIPINE BESYLATE 5 MG PO TABS: 5 MG | ORAL | Qty: 2

## 2023-01-19 MED FILL — DIPRIVAN 500 MG/50ML IV EMUL: 500 MG/50ML | INTRAVENOUS | Qty: 50

## 2023-01-19 MED FILL — OXYCODONE HCL 5 MG PO TABS: 5 MG | ORAL | Qty: 1

## 2023-01-19 MED FILL — VENOFER 20 MG/ML IV SOLN: 20 MG/ML | INTRAVENOUS | Qty: 10

## 2023-01-19 NOTE — Plan of Care (Signed)
Problem: Pain  Goal: Verbalizes/displays adequate comfort level or baseline comfort level  01/19/2023 0123 by Shari Prows, RN  Outcome: Progressing  01/18/2023 1202 by Theadora Rama, RN  Outcome: HH/HSPC Progressing     Problem: ABCDS Injury Assessment  Goal: Absence of physical injury  01/19/2023 0123 by Shari Prows, RN  Outcome: Progressing  01/18/2023 1202 by Theadora Rama, RN  Outcome: HH/HSPC Progressing     Problem: Skin/Tissue Integrity  Goal: Absence of new skin breakdown  Description: 1.  Monitor for areas of redness and/or skin breakdown  2.  Assess vascular access sites hourly  3.  Every 4-6 hours minimum:  Change oxygen saturation probe site  4.  Every 4-6 hours:  If on nasal continuous positive airway pressure, respiratory therapy assess nares and determine need for appliance change or resting period.  01/19/2023 0123 by Shari Prows, RN  Outcome: Progressing  01/18/2023 1202 by Theadora Rama, RN  Outcome: HH/HSPC Progressing     Problem: Safety - Adult  Goal: Free from fall injury  01/19/2023 0123 by Shari Prows, RN  Outcome: Progressing  01/18/2023 1202 by Theadora Rama, RN  Outcome: HH/HSPC Progressing       Problem: Discharge Planning  Goal: Discharge to home or other facility with appropriate resources  01/19/2023 0123 by Shari Prows, RN  Outcome: Progressing  01/18/2023 1202 by Theadora Rama, RN  Outcome: HH/HSPC Progressing

## 2023-01-19 NOTE — Progress Notes (Signed)
[facility]  [address] [place], [place] [address]  [phone]      Hospitalist Progress Note      NAME: Annai Heick   DOB:  Oct 10, 1981  MRM:  [id number]    Date of service: 01/19/2023        Assessment and Plan:     Epigastric abdominal pain/melena POA:  Unclear etiology. EGD 4/08 w/ retained food in stomach; repeat EGD 4/09 didn't show any acute concerns. Colonoscopy 4/10 w/ no acute concerns. Plan for pillcam in am. Continue PPI IV twice daily.  GI following     Microcytic anemia POA:  Possible GI blood loss.  Iron levels low; continue Iv iron. Patient stated that in the past, she noted melena.  Work up as above     SIRS POA: Leukocytosis and Tachycardia. Lactic acid elevated.  Resolved. Blood cultures remain negative. Was on Abx, which was discontinued. UC: Staph epidermidis, which is skin flora and a contamination.        Abnormal UA POA: Not UTI. UA with Many epithelial cells and UC: staph epidermidis. Likely contamination. Stopped Abx. No UTI on admission.      AKI (acute kidney injury) (HCC) vs CKD POA:  Holding lisinopril and hydrochlorothiazide. Monitor Cr and cont IVF       DM (diabetes mellitus) (HCC).  Continue home insulin and cover with sliding      HTN (hypertension).  Holding hydrochlorothiazide and lisinopril due to renal insufficiency. Continue amlodipine and increase metoprolol      Hyperlipidemia.  On statin         Subjective:     Chief Complaint:: Patient was seen and examined as a follow up for abdominal pain.  Chart was reviewed.  abdominal pain improved. No overt bleeding           Objective:     Last 24hrs VS reviewed since prior progress note. Most recent are:    Vitals:    01/19/23 1123   BP:    Pulse:    Resp:    Temp: 98.2 F (36.8 C)   SpO2:      SpO2 Readings from Last 6 Encounters:   01/19/23 96%   06/29/22 100%   05/15/22 98%   02/16/22 99%          Intake/Output Summary (Last 24 hours) at 01/19/2023 1132  Last data filed at 01/19/2023  1115  Gross per 24 hour   Intake 200 ml   Output --   Net 200 ml          Physical Exam:    Gen:  Well-developed, well-nourished, in no acute distress  HEENT:  Pink conjunctivae, PERRL, hearing intact to voice  Resp:  No accessory muscle use, clear breath sounds without wheezes rales or rhonchi  Card:  No murmurs, normal S1, S2 without thrills, bruits or peripheral edema  Abd:  Soft, non-tender, non-distended, normoactive bowel sounds are present  Lymph:  No cervical or inguinal adenopathy  Musc:  No cyanosis or clubbing  Skin:  No rashes or ulcers, skin turgor is good  Neuro:  Cranial nerves are grossly intact, no focal motor weakness, follows commands appropriately  Psych: fair insight   __________________________________________________________________  Medications Reviewed: (see below)  Medications:     Current Facility-Administered Medications   Medication Dose Route Frequency    [START ON 01/20/2023] metoprolol succinate (TOPROL XL) extended release tablet 50 mg  50 mg Oral Daily  metoprolol succinate (TOPROL XL) extended release tablet 25 mg  25 mg Oral Once    iron sucrose (VENOFER) injection 200 mg  200 mg IntraVENous Q24H    LORazepam (ATIVAN) tablet 0.25 mg  0.25 mg Oral Q6H PRN    glucose chewable tablet 16 g  4 tablet Oral PRN    dextrose bolus 10% 125 mL  125 mL IntraVENous PRN    Or    dextrose bolus 10% 250 mL  250 mL IntraVENous PRN    glucagon injection 1 mg  1 mg SubCUTAneous PRN    dextrose 10 % infusion   IntraVENous Continuous PRN    0.9 % sodium chloride infusion   IntraVENous PRN    prochlorperazine (COMPAZINE) injection 10 mg  10 mg IntraVENous Q6H PRN    amLODIPine (NORVASC) tablet 10 mg  10 mg Oral Daily    fenofibrate (TRIGLIDE) tablet 160 mg  160 mg Oral Daily    atorvastatin (LIPITOR) tablet 10 mg  10 mg Oral Daily    sodium chloride flush 0.9 % injection 5-40 mL  5-40 mL IntraVENous 2 times per day    sodium chloride flush 0.9 % injection 5-40 mL  5-40 mL IntraVENous PRN    0.9 %  sodium chloride infusion   IntraVENous PRN    potassium chloride (KLOR-CON) extended release tablet 40 mEq  40 mEq Oral PRN    Or    potassium bicarb-citric acid (EFFER-K) effervescent tablet 40 mEq  40 mEq Oral PRN    Or    potassium chloride 10 mEq/100 mL IVPB (Peripheral Line)  10 mEq IntraVENous PRN    magnesium sulfate 2000 mg in 50 mL IVPB premix  2,000 mg IntraVENous PRN    ondansetron (ZOFRAN-ODT) disintegrating tablet 4 mg  4 mg Oral Q8H PRN    Or    ondansetron (ZOFRAN) injection 4 mg  4 mg IntraVENous Q6H PRN    polyethylene glycol (GLYCOLAX) packet 17 g  17 g Oral Daily PRN    acetaminophen (TYLENOL) tablet 650 mg  650 mg Oral Q6H PRN    Or    acetaminophen (TYLENOL) suppository 650 mg  650 mg Rectal Q6H PRN    insulin lispro (HUMALOG) injection vial 0-8 Units  0-8 Units SubCUTAneous TID WC    insulin lispro (HUMALOG) injection vial 0-4 Units  0-4 Units SubCUTAneous Nightly    morphine (PF) injection 2 mg  2 mg IntraVENous Q4H PRN    oxyCODONE (ROXICODONE) immediate release tablet 5 mg  5 mg Oral Q4H PRN    [Held by provider] insulin glargine (LANTUS) injection vial 30 Units  30 Units SubCUTAneous Daily    prochlorperazine (COMPAZINE) tablet 5 mg  5 mg Oral Q6H PRN    pantoprazole (PROTONIX) 40 mg in sodium chloride (PF) 0.9 % 10 mL injection  40 mg IntraVENous Q12H    hydrALAZINE (APRESOLINE) injection 10 mg  10 mg IntraVENous Q8H PRN        Lab Data Reviewed: (see below)  Lab Review:     Recent Labs     01/17/23  0407 01/18/23  0620 01/19/23  0800   WBC 9.3 7.9 7.0   HGB 8.6* 9.4* 8.3*   HCT 27.3* 29.8* 25.9*   PLT 418* 424* 377       Recent Labs     01/17/23  0407 01/18/23  0620 01/19/23  0800   NA 139 138 140   K 3.4* 4.2 3.8   CL 107 109* 108  CO2 BUN No results found for: "GLUCPOC"  No results for input(s): "PH", "PCO2", "PO2", "HCO3", "FIO2" in the last 72 hours.  No results for input(s): "INR" in the last 72 hours.  @    I have reviewed notes of prior  24hr.    Other pertinent lab:     Total time: -35- minutes. I personally saw and examined the patient during this time period.  Greater than 50% of this time was spent in counseling and coordination of care.    I personally reviewed chart, notes, data and current medications in the medical record.  I have personally examined and treated the patient at bedside during this period.                 Care Plan discussed with: Patient, Family, Nursing Staff, and >50% of time spent in counseling and coordination of care    Discussed:  Care Plan    Prophylaxis:  SCD's    Disposition:  Home w/Family           ___________________________________________________    Attending Physician: Horald Chestnut, DO

## 2023-01-19 NOTE — Anesthesia Pre-Procedure Evaluation (Signed)
Department of Anesthesiology  Preprocedure Note       Name:  Melinda [name]   Age:  42 y.o.  DOB:  September 27, 1981                                          MRN:  [id number]         Date:  01/19/2023      Surgeon: Moishe Spice):  Glyn Ade, MD    Procedure: Procedure(s):  COLONOSCOPY DIAGNOSTIC    Medications prior to admission:   Prior to Admission medications    Medication Sig Start Date End Date Taking? Authorizing Provider   spironolactone (ALDACTONE) 25 MG tablet Take 1 tablet by mouth daily   Yes [provider]   lisinopril (PRINIVIL;ZESTRIL) 40 MG tablet Take 1 tablet by mouth daily   Yes [provider]   amLODIPine (NORVASC) 10 MG tablet Take 1 tablet by mouth daily   Yes [provider]   hydroCHLOROthiazide (HYDRODIURIL) 25 MG tablet Take 1 tablet by mouth daily   Yes [provider]   insulin 70-30 (NOVOLIN 70/30) (70-30) 100 UNIT per ML injection vial Inject 15 Units into the skin 2 times daily 07/10/21   Automatic Reconciliation, Ar       Current medications:    Current Facility-Administered Medications   Medication Dose Route Frequency Provider Last Rate Last Admin    iron sucrose (VENOFER) injection 200 mg  200 mg IntraVENous Q24H Jamil, Nora, DO   200 mg at 01/18/23 1055    LORazepam (ATIVAN) tablet 0.25 mg  0.25 mg Oral Q6H PRN Horald Chestnut, DO   0.25 mg at 01/19/23 0529    glucose chewable tablet 16 g  4 tablet Oral PRN Tefera, Mesfin A, MD        dextrose bolus 10% 125 mL  125 mL IntraVENous PRN Tefera, Mesfin A, MD        Or    dextrose bolus 10% 250 mL  250 mL IntraVENous PRN Tefera, Mesfin A, MD        glucagon injection 1 mg  1 mg SubCUTAneous PRN Tefera, Mesfin A, MD        dextrose 10 % infusion   IntraVENous Continuous PRN Tefera, Mesfin A, MD        0.9 % sodium chloride infusion   IntraVENous PRN Tefera, Mesfin A, MD        prochlorperazine (COMPAZINE) injection 10 mg  10 mg IntraVENous Q6H PRN Tefera, Mesfin A, MD   10 mg at 01/16/23 0843    amLODIPine  (NORVASC) tablet 10 mg  10 mg Oral Daily Tefera, Mesfin A, MD   10 mg at 01/18/23 1042    fenofibrate (TRIGLIDE) tablet 160 mg  160 mg Oral Daily Tefera, Mesfin A, MD   160 mg at 01/18/23 1042    metoprolol succinate (TOPROL XL) extended release tablet 25 mg  25 mg Oral Daily Tefera, Mesfin A, MD   25 mg at 01/18/23 1042    atorvastatin (LIPITOR) tablet 10 mg  10 mg Oral Daily Tefera, Mesfin A, MD   10 mg at 01/18/23 1042    sodium chloride flush 0.9 % injection 5-40 mL  5-40 mL IntraVENous 2 times per day Tefera, Mesfin A, MD   10 mL at 01/19/23 0803    sodium chloride flush 0.9 % injection 5-40 mL  5-40 mL  IntraVENous PRN Tefera, Mesfin A, MD        0.9 % sodium chloride infusion   IntraVENous PRN Tefera, Mesfin A, MD        potassium chloride (KLOR-CON) extended release tablet 40 mEq  40 mEq Oral PRN Tefera, Mesfin A, MD        Or    potassium bicarb-citric acid (EFFER-K) effervescent tablet 40 mEq  40 mEq Oral PRN Tefera, Mesfin A, MD        Or    potassium chloride 10 mEq/100 mL IVPB (Peripheral Line)  10 mEq IntraVENous PRN Tefera, Mesfin A, MD        magnesium sulfate 2000 mg in 50 mL IVPB premix  2,000 mg IntraVENous PRN Tefera, Mesfin A, MD        ondansetron (ZOFRAN-ODT) disintegrating tablet 4 mg  4 mg Oral Q8H PRN Tefera, Mesfin A, MD        Or    ondansetron (ZOFRAN) injection 4 mg  4 mg IntraVENous Q6H PRN Tefera, Mesfin A, MD   4 mg at 01/18/23 2137    polyethylene glycol (GLYCOLAX) packet 17 g  17 g Oral Daily PRN Tefera, Mesfin A, MD        acetaminophen (TYLENOL) tablet 650 mg  650 mg Oral Q6H PRN Tefera, Mesfin A, MD   650 mg at 01/19/23 0044    Or    acetaminophen (TYLENOL) suppository 650 mg  650 mg Rectal Q6H PRN Tefera, Mesfin A, MD        insulin lispro (HUMALOG) injection vial 0-8 Units  0-8 Units SubCUTAneous TID WC Tefera, Mesfin A, MD   2 Units at 01/15/23 1005    insulin lispro (HUMALOG) injection vial 0-4 Units  0-4 Units SubCUTAneous Nightly Tefera, Mesfin A, MD        morphine (PF)  injection 2 mg  2 mg IntraVENous Q4H PRN Tefera, Mesfin A, MD   2 mg at 01/19/23 0801    oxyCODONE (ROXICODONE) immediate release tablet 5 mg  5 mg Oral Q4H PRN Tefera, Mesfin A, MD   5 mg at 01/19/23 0529    [Held by provider] insulin glargine (LANTUS) injection vial 30 Units  30 Units SubCUTAneous Daily Jaci Lazier, MD   30 Units at 01/16/23 0843    ondansetron (ZOFRAN) injection 4 mg  4 mg IntraVENous q8h Jaci Lazier, MD   4 mg at 01/19/23 0526    prochlorperazine (COMPAZINE) tablet 5 mg  5 mg Oral Q6H PRN Jaci Lazier, MD   5 mg at 01/15/23 0334    pantoprazole (PROTONIX) 40 mg in sodium chloride (PF) 0.9 % 10 mL injection  40 mg IntraVENous Q12H Bright, Abigail J, PA-C   40 mg at 01/19/23 0759    hydrALAZINE (APRESOLINE) injection 10 mg  10 mg IntraVENous Q8H PRN Jaci Lazier, MD   10 mg at 01/15/23 1610       Allergies:  No Known Allergies    Problem List:    Patient Active Problem List   Diagnosis Code    Nausea and vomiting R11.2    Sepsis (HCC) A41.9    Abdominal pain R10.9    UTI (urinary tract infection) N39.0    AKI (acute kidney injury) (HCC) N17.9    Leukocytosis D72.829    Elevated lactic acid level R79.89    Tachycardia R00.0    DM (diabetes mellitus) (HCC) E11.9    HTN (hypertension) I10    Hyperlipidemia E78.5  Past Medical History:        Diagnosis Date    High blood pressure     High cholesterol     Type 2 diabetes mellitus (HCC)     Controled       Past Surgical History:        Procedure Laterality Date    OTHER SURGICAL HISTORY Left     Rentia attachment    UPPER GASTROINTESTINAL ENDOSCOPY N/A 01/17/2023    ESOPHAGOGASTRODUODENOSCOPY performed by Glyn Ade, MD at [facility] ENDOSCOPY    UPPER GASTROINTESTINAL ENDOSCOPY N/A 01/17/2023    ESOPHAGOGASTRODUODENOSCOPY BIOPSY performed by Glyn Ade, MD at [facility] ENDOSCOPY    UPPER GASTROINTESTINAL ENDOSCOPY N/A 01/18/2023    ESOPHAGOGASTRODUODENOSCOPY performed by Glyn Ade, MD at [facility] ENDOSCOPY       Social History:    Social History      Tobacco Use    Smoking status: Never    Smokeless tobacco: Never   Substance Use Topics    Alcohol use: Never                                Counseling given: Not Answered      Vital Signs (Current):   Vitals:    01/18/23 2207 01/18/23 2347 01/19/23 0329 01/19/23 0717   BP:  (!) 147/75 (!) 156/92 (!) 144/86   Pulse:  90 93 (!) 101   Resp: 16 16 16 16    Temp:  98.2 F (36.8 C) 98.2 F (36.8 C) 98.4 F (36.9 C)   TempSrc:  Oral Oral Oral   SpO2:  97% 97% 97%   Weight:       Height:                                                  BP Readings from Last 3 Encounters:   01/19/23 (!) 144/86   06/29/22 (!) 165/97   05/15/22 (!) 183/83       NPO Status: Time of last liquid consumption: 2200                        Time of last solid consumption: 1700                        Date of last liquid consumption: 01/17/23                        Date of last solid food consumption: 01/16/23    BMI:   Wt Readings from Last 3 Encounters:   01/18/23 65.8 kg (145 lb)   06/28/22 59.9 kg (132 lb)   05/15/22 62.6 kg (138 lb)     Body mass index is 29.29 kg/m.    CBC:   Lab Results   Component Value Date/Time    WBC 7.0 01/19/2023 08:00 AM    RBC 3.41 01/19/2023 08:00 AM    HGB 8.3 01/19/2023 08:00 AM    HCT 25.9 01/19/2023 08:00 AM    MCV 76.0 01/19/2023 08:00 AM    RDW 17.2 01/19/2023 08:00 AM    PLT 377 01/19/2023 08:00 AM       CMP:   Lab Results   Component Value Date/Time    NA  140 01/19/2023 08:00 AM    K 3.8 01/19/2023 08:00 AM    CL 108 01/19/2023 08:00 AM    CO2 27 01/19/2023 08:00 AM    BUN 9 01/19/2023 08:00 AM    CREATININE 1.28 01/19/2023 08:00 AM    GFRAA >60 07/10/2021 12:01 AM    AGRATIO 0.7 01/15/2023 04:47 AM    AGRATIO 0.8 10/22/2021 10:00 AM    LABGLOM 54 01/19/2023 08:00 AM    GLUCOSE 96 01/19/2023 08:00 AM    PROT 5.5 01/15/2023 04:47 AM    CALCIUM 7.9 01/19/2023 08:00 AM    BILITOT 0.6 01/15/2023 04:47 AM    ALKPHOS 87 01/15/2023 04:47 AM    ALKPHOS 98 10/22/2021 10:00 AM    AST 13 01/15/2023 04:47 AM    ALT  11 01/15/2023 04:47 AM       POC Tests:   Recent Labs     01/19/23  0741   POCGLU 93         Coags: No results found for: "PROTIME", "INR", "APTT"    HCG (If Applicable):   Lab Results   Component Value Date    PREGTESTUR Negative 01/14/2023        ABGs: No results found for: "PHART", "PO2ART", "PCO2ART", "HCO3ART", "BEART", "O2SATART"     Type & Screen (If Applicable):  No results found for: "LABABO", "LABRH"    Drug/Infectious Status (If Applicable):  No results found for: "HIV", "HEPCAB"    COVID-19 Screening (If Applicable):   Lab Results   Component Value Date/Time    COVID19 Not detected 06/28/2022 09:52 PM           Anesthesia Evaluation  Patient summary reviewed and Nursing notes reviewed  Airway: Mallampati: II  TM distance: >3 FB   Neck ROM: full  Mouth opening: > = 3 FB   Dental:    (+) poor dentition      Pulmonary:Negative Pulmonary ROS breath sounds clear to auscultation                             Cardiovascular:  Exercise tolerance: good (>4 METS)  (+) hypertension:        Rhythm: regular  Rate: normal           Beta Blocker:  Not on Beta Blocker         Neuro/Psych:   Negative Neuro/Psych ROS              GI/Hepatic/Renal:   (+) bowel prep          Endo/Other:    (+) Diabetes.          Pt had no PAT visit       Abdominal:             Vascular: negative vascular ROS.         Other Findings:           Anesthesia Plan      MAC     ASA 2       Induction: intravenous.      Anesthetic plan and risks discussed with patient.      Plan discussed with CRNA.                  Cherlynn Kaiser, MD   01/19/2023

## 2023-01-19 NOTE — H&P (Signed)
.Pre-Endoscopy H&P Update  Chief complaint/HPI/ROS:  The indication for the procedure, the patient's history and the patient's current medications are reviewed prior to the procedure and that data is reported on the H&P to which this document is attached.  Any significant complaints with regard to organ systems will be noted, and if not mentioned then a review of systems is not contributory.  Past Medical History:   Diagnosis Date    High blood pressure     High cholesterol     Type 2 diabetes mellitus (HCC)     Controled      Past Surgical History:   Procedure Laterality Date    OTHER SURGICAL HISTORY Left     Rentia attachment    UPPER GASTROINTESTINAL ENDOSCOPY N/A 01/17/2023    ESOPHAGOGASTRODUODENOSCOPY performed by Glyn Ade, MD at [facility] ENDOSCOPY    UPPER GASTROINTESTINAL ENDOSCOPY N/A 01/17/2023    ESOPHAGOGASTRODUODENOSCOPY BIOPSY performed by Glyn Ade, MD at [facility] ENDOSCOPY    UPPER GASTROINTESTINAL ENDOSCOPY N/A 01/18/2023    ESOPHAGOGASTRODUODENOSCOPY performed by Glyn Ade, MD at [facility] ENDOSCOPY     Social   Social History     Tobacco Use    Smoking status: Never    Smokeless tobacco: Never   Substance Use Topics    Alcohol use: Never      History reviewed. No pertinent family history.   No Known Allergies   Prior to Admission Medications   Prescriptions Last Dose Informant Patient Reported? Taking?   amLODIPine (NORVASC) 10 MG tablet 01/13/2023  Yes Yes   Sig: Take 1 tablet by mouth daily   amLODIPine (NORVASC) 5 MG tablet   No No   Sig: Take 2 tablets by mouth daily   amLODIPine (NORVASC) 5 MG tablet   No No   Sig: Take 2 tablets by mouth daily   fenofibrate (TRIGLIDE) 160 MG tablet   Yes No   Sig: Take 1 tablet by mouth daily   ferrous sulfate (IRON 325) 325 (65 Fe) MG tablet   Yes No   Sig: Take by mouth every morning (before breakfast)   Patient not taking: Reported on 02/16/2022   hydroCHLOROthiazide (HYDRODIURIL) 25 MG tablet   Yes No   Sig: Take 1 tablet by mouth daily   hydroCHLOROthiazide  (HYDRODIURIL) 25 MG tablet   No No   Sig: Take 1 tablet by mouth every morning   hydroCHLOROthiazide (HYDRODIURIL) 25 MG tablet 01/13/2023  Yes Yes   Sig: Take 1 tablet by mouth daily   insulin 70-30 (NOVOLIN 70/30) (70-30) 100 UNIT per ML injection vial   Yes No   Sig: Inject 15 Units into the skin 2 times daily   lisinopril (PRINIVIL;ZESTRIL) 10 MG tablet   Yes No   Sig: Take 2 tablets by mouth daily   lisinopril (PRINIVIL;ZESTRIL) 40 MG tablet 01/13/2023  Yes Yes   Sig: Take 1 tablet by mouth daily   metoprolol succinate (TOPROL XL) 25 MG extended release tablet   Yes No   Sig: Take 1 tablet by mouth daily   metoprolol succinate (TOPROL XL) 25 MG extended release tablet   No No   Sig: Take 1 tablet by mouth daily   naproxen (NAPROSYN) 500 MG tablet   No No   Sig: Take 1 tablet by mouth 2 times daily (with meals)   silver sulfADIAZINE (SILVADENE) 1 % cream   No No   Sig: Apply topically twice daily.   simvastatin (ZOCOR) 20 MG tablet   Yes No  Sig: Take 1 tablet by mouth nightly   Patient not taking: Reported on 02/16/2022   simvastatin (ZOCOR) 20 MG tablet   No No   Sig: Take 1 tablet by mouth nightly   spironolactone (ALDACTONE) 25 MG tablet 01/13/2023  Yes Yes   Sig: Take 1 tablet by mouth daily      Facility-Administered Medications: None       PHYSICAL EXAM:  The patient is examined immediately prior to the procedure.  Vitals:    01/19/23 0717   BP: (!) 144/86   Pulse: (!) 101   Resp: 16   Temp: 98.4 F (36.9 C)   SpO2: 97%     Gen: Appears comfortable, no distress.  Pulm: comfortable respirations with no abnormal audible breath sounds  HEART: well perfused, no abnormal audible heart sounds  GI: abdomen flat.    PLAN:  Informed consent discussion held, patient afforded an opportunity to ask questions and all questions answered.  After being advised of the risks, benefits, and alternatives, the patient requested that we proceed and indicated so on a written consent form.      Will proceed with procedure as  planned.  Glyn Ade, MD

## 2023-01-19 NOTE — Other (Signed)
TRANSFER - OUT REPORT:    Verbal report given to Katie, RN on Melinda [name]  being transferred to 405 for routine progression of patient care       Report consisted of patient's Situation, Background, Assessment and   Recommendations(SBAR).     Information from the following report(s) Nurse Handoff Report was reviewed with the receiving nurse.           Lines:   Peripheral IV 01/16/23 Left;Lateral Forearm (Active)   Site Assessment Clean, dry & intact 01/19/23 1124   Line Status Infusing 01/19/23 1124   Line Care Connections checked and tightened 01/19/23 1124   Phlebitis Assessment No symptoms 01/19/23 1124   Infiltration Assessment 0 01/19/23 1124   Alcohol Cap Used Yes 01/19/23 1124   Dressing Status Clean, dry & intact 01/19/23 1124   Dressing Type Transparent 01/19/23 1124   Dressing Intervention Dressing changed 01/19/23 1046        Opportunity for questions and clarification was provided.      Patient transported with:  [employer]

## 2023-01-19 NOTE — Anesthesia Post-Procedure Evaluation (Signed)
Department of Anesthesiology  Postprocedure Note    Patient: Melinda [name]  MRN: [id number]  Birthdate: 1981/09/22  Date of evaluation: 01/19/2023    Procedure Summary       Date: 01/19/23 Room / Location: [facility] ENDO 02 / [facility] ENDOSCOPY    Anesthesia Start: 1057 Anesthesia Stop: 1117    Procedure: COLONOSCOPY DIAGNOSTIC Diagnosis:       Anemia, unspecified type      (Anemia, unspecified type [D64.9])    Surgeons: Glyn Ade, MD Responsible Provider: Cherlynn Kaiser, MD    Anesthesia Type: MAC ASA Status: 2            Anesthesia Type: No value filed.    Aldrete Phase I: Aldrete Score: 10    Aldrete Phase II: Aldrete Score: 10    Anesthesia Post Evaluation    Patient location during evaluation: PACU  Patient participation: complete - patient participated  Level of consciousness: awake  Pain score: 0  Airway patency: patent  Nausea & Vomiting: no nausea and no vomiting  Cardiovascular status: blood pressure returned to baseline  Respiratory status: acceptable  Hydration status: euvolemic  Multimodal analgesia pain management approach  Pain management: adequate    No notable events documented.

## 2023-01-19 NOTE — Op Note (Signed)
Marland Kitchen                       [facility]  [place] [facility] [place], [place]  [id number]      January 19, 2023    Colonoscopy Procedure Note  Melinda [name]  DOB:  1980/10/24  [facility] Medical Record Number: [id number]    Indications:   melena, iron deficiency anemia, CT scan abdomen pelvis with circumferential thickening in the cecum  PCP:  No primary care provider on file.  Anesthesia/Sedation: Monitored anesthesia care, see separate note  Endoscopist:  Glyn Ade, MD   Complications:  None  Estimated Blood Loss:  None    Permit:  The indications, risks, benefits and alternatives were reviewed with the patient or their decision maker who was provided an opportunity to ask questions and all questions were answered.  The specific risks of colonoscopy with conscious sedation were reviewed, including but not limited to anesthetic complication, bleeding, adverse drug reaction, missed lesion, infection, IV site reactions, and intestinal perforation which would lead to the need for surgical repair.  Alternatives to colonoscopy including radiographic imaging, observation without testing, or laboratory testing were reviewed including the limitations of those alternatives.  After considering the options and having all their questions answered, the patient or their decision maker provided both verbal and written consent to proceed.        Procedure in Detail:  After obtaining informed consent, positioning of the patient in the left lateral decubitus position, and conduction of a pre-procedure pause or "time out" the endoscope was introduced into the anus and advanced to the cerum.  The quality of the colonic preparation was good.  A careful inspection was made as the colonoscope was withdrawn, findings and interventions are described below.  After cleaning the colon, the Boston Bowel Prep score: 8   Right colon-3, transverse colon-3, left colon-2    Findings:   Digital rectal  exam-normal perineal skin inspection, small external hemorrhoids, no palpable anal canal lesions    Terminal ileum-healthy-appearing mucosa intubated 10 cm proximal to the IC valve  Cecum-lipomatous appearing ileocecal valve, normal-appearing appendiceal orifice, healthy-appearing cecal base  Ascending colon, transverse colon, descending colon-healthy-appearing mucosa  Sigmoid colon-few rare sigmoid colon diverticula and muscular hypertrophy benign-appearing  Anorectum-healthy appearing mucosa, small internal hemorrhoids nonbleeding    Specimens:   None     Complications:   None; patient tolerated the procedure well.    Impression:  Full colonoscopy including 10 cm terminal ileum intubation, no evidence of active or recent bleeding lesions, only small internal hemorrhoids noted    Recommendations:   No clear cause of profound iron deficiency anemia with melena and epigastric pain identified on EGD or colonoscopy  We will proceed with clear liquid diet today, nothing to eat or drink after midnight, video capsule endoscopy by PillCam tomorrow as an inpatient  Repeat colonoscopy at age 42 for an average risk colorectal cancer screening  Awaiting final results from EGD with chronic active gastritis; awaiting H. pylori testing  We will contact you by patient later in 7-14 business days regarding pathology results.  Please contact our [facility] office with additional questions or concerns at [phone].    Thank you for entrusting me with this patient's care.  Please do not hesitate to contact me with any questions or if I can be of assistance with any of your other patients' GI needs.    Signed By:  Glyn Ade, MD                        January 19, 2023      Surgical assistant none.  Implants none unless specified.

## 2023-01-19 NOTE — Other (Signed)
TRANSFER - IN REPORT:    Verbal report received from [facility] on Melinda [name]  being received from 405 for ordered procedure      Report consisted of patient's Situation, Background, Assessment and   Recommendations(SBAR).     Information from the following report(s) Alarm Parameters, Pre Procedure Checklist, and Procedure Verification was reviewed with the receiving nurse.    Opportunity for questions and clarification was provided.      Assessment completed upon patient's arrival to unit and care assumed.

## 2023-01-19 NOTE — Plan of Care (Signed)
Problem: Discharge Planning  Goal: Discharge to home or other facility with appropriate resources  01/19/2023 0911 by Marikay Alar, RN  Outcome: Progressing  01/19/2023 0123 by Shari Prows, RN  Outcome: Progressing     Problem: Pain  Goal: Verbalizes/displays adequate comfort level or baseline comfort level  01/19/2023 0911 by Marikay Alar, RN  Outcome: Progressing  01/19/2023 0123 by Shari Prows, RN  Outcome: Progressing     Problem: ABCDS Injury Assessment  Goal: Absence of physical injury  01/19/2023 0911 by Marikay Alar, RN  Outcome: Progressing  01/19/2023 0123 by Shari Prows, RN  Outcome: Progressing     Problem: Skin/Tissue Integrity  Goal: Absence of new skin breakdown  Description: 1.  Monitor for areas of redness and/or skin breakdown  2.  Assess vascular access sites hourly  3.  Every 4-6 hours minimum:  Change oxygen saturation probe site  4.  Every 4-6 hours:  If on nasal continuous positive airway pressure, respiratory therapy assess nares and determine need for appliance change or resting period.  01/19/2023 0911 by Marikay Alar, RN  Outcome: Progressing  01/19/2023 0123 by Shari Prows, RN  Outcome: Progressing     Problem: Safety - Adult  Goal: Free from fall injury  01/19/2023 0911 by Marikay Alar, RN  Outcome: Progressing  01/19/2023 0123 by Shari Prows, RN  Outcome: Progressing     Problem: Chronic Conditions and Co-morbidities  Goal: Patient's chronic conditions and co-morbidity symptoms are monitored and maintained or improved  01/19/2023 0911 by Marikay Alar, RN  Outcome: Progressing  01/19/2023 0123 by Shari Prows, RN  Outcome: Progressing

## 2023-01-20 LAB — CBC WITH AUTO DIFFERENTIAL
Basophils %: 1 % (ref 0–1)
Basophils Absolute: 0.1 10*3/uL (ref 0.0–0.1)
Eosinophils %: 6 % (ref 0–7)
Eosinophils Absolute: 0.4 10*3/uL (ref 0.0–0.4)
Hematocrit: 26.2 % — ABNORMAL LOW (ref 35.0–47.0)
Hemoglobin: 8.3 g/dL — ABNORMAL LOW (ref 11.5–16.0)
Immature Granulocytes %: 0 % (ref 0.0–0.5)
Immature Granulocytes Absolute: 0 10*3/uL (ref 0.00–0.04)
Lymphocytes %: 37 % (ref 12–49)
Lymphocytes Absolute: 2.8 10*3/uL (ref 0.8–3.5)
MCH: 24.5 PG — ABNORMAL LOW (ref 26.0–34.0)
MCHC: 31.7 g/dL (ref 30.0–36.5)
MCV: 77.3 FL — ABNORMAL LOW (ref 80.0–99.0)
MPV: 9 FL (ref 8.9–12.9)
Monocytes %: 11 % (ref 5–13)
Monocytes Absolute: 0.8 10*3/uL (ref 0.0–1.0)
Neutrophils %: 45 % (ref 32–75)
Neutrophils Absolute: 3.4 10*3/uL (ref 1.8–8.0)
Nucleated RBCs: 0 PER 100 WBC
Platelets: 390 10*3/uL (ref 150–400)
RBC: 3.39 M/uL — ABNORMAL LOW (ref 3.80–5.20)
RDW: 17.2 % — ABNORMAL HIGH (ref 11.5–14.5)
WBC: 7.5 10*3/uL (ref 3.6–11.0)
nRBC: 0 10*3/uL (ref 0.00–0.01)

## 2023-01-20 LAB — BASIC METABOLIC PANEL
Anion Gap: 5 mmol/L (ref 5–15)
BUN/Creatinine Ratio: 7 — ABNORMAL LOW (ref 12–20)
BUN: 8 MG/DL (ref 6–20)
CO2: 26 mmol/L (ref 21–32)
Calcium: 8.5 MG/DL (ref 8.5–10.1)
Chloride: 106 mmol/L (ref 97–108)
Creatinine: 1.22 MG/DL — ABNORMAL HIGH (ref 0.55–1.02)
Est, Glom Filt Rate: 58 mL/min/{1.73_m2} — ABNORMAL LOW (ref >60)
Glucose: 132 mg/dL — ABNORMAL HIGH (ref 65–100)
Potassium: 3.8 mmol/L (ref 3.5–5.1)
Sodium: 137 mmol/L (ref 136–145)

## 2023-01-20 LAB — POCT GLUCOSE
POC Glucose: 136 mg/dL — ABNORMAL HIGH (ref 65–117)
POC Glucose: 137 mg/dL — ABNORMAL HIGH (ref 65–117)
POC Glucose: 86 mg/dL (ref 65–117)

## 2023-01-20 LAB — CULTURE, BLOOD 2: Culture: NO GROWTH

## 2023-01-20 LAB — CULTURE, BLOOD 1: Culture: NO GROWTH

## 2023-01-20 MED ORDER — FERROUS SULFATE 325 (65 FE) MG PO TABS
32565 (65 Fe) MG | ORAL_TABLET | Freq: Every day | ORAL | 0 refills | Status: DC
Start: 2023-01-20 — End: 2023-02-14

## 2023-01-20 MED ORDER — ONDANSETRON 4 MG PO TBDP
4 MG | ORAL_TABLET | Freq: Three times a day (TID) | ORAL | 0 refills | Status: DC | PRN
Start: 2023-01-20 — End: 2023-02-14

## 2023-01-20 MED ORDER — LISINOPRIL 20 MG PO TABS
20 | Freq: Every day | ORAL | Status: DC
Start: 2023-01-20 — End: 2023-01-20
  Administered 2023-01-20: 18:00:00 40 mg via ORAL

## 2023-01-20 MED ORDER — SPIRONOLACTONE 25 MG PO TABS
25 | Freq: Every day | ORAL | Status: DC
Start: 2023-01-20 — End: 2023-01-20
  Administered 2023-01-20: 18:00:00 25 mg via ORAL

## 2023-01-20 MED ORDER — NALOXONE HCL 4 MG/0.1ML NA LIQD
4 | NASAL | 0 refills | Status: DC | PRN
Start: 2023-01-20 — End: 2023-09-18

## 2023-01-20 MED ORDER — OXYCODONE HCL 5 MG PO TABS
5 | ORAL_TABLET | Freq: Three times a day (TID) | ORAL | 0 refills | Status: AC | PRN
Start: 2023-01-20 — End: 2023-01-23

## 2023-01-20 MED ORDER — METOPROLOL SUCCINATE ER 50 MG PO TB24
50 | ORAL_TABLET | Freq: Every day | ORAL | 0 refills | 90.00 days | Status: DC
Start: 2023-01-20 — End: 2023-02-14

## 2023-01-20 MED ORDER — POLYETHYLENE GLYCOL 3350 17 G PO PACK
17 | PACK | Freq: Every day | ORAL | 0 refills | 27.00 days | Status: DC | PRN
Start: 2023-01-20 — End: 2023-02-14

## 2023-01-20 MED FILL — LORAZEPAM 0.5 MG PO TABS: 0.5 MG | ORAL | Qty: 1

## 2023-01-20 MED FILL — SPIRONOLACTONE 25 MG PO TABS: 25 MG | ORAL | Qty: 1

## 2023-01-20 MED FILL — VENOFER 20 MG/ML IV SOLN: 20 MG/ML | INTRAVENOUS | Qty: 10

## 2023-01-20 MED FILL — MORPHINE SULFATE 2 MG/ML IJ SOLN: 2 mg/mL | INTRAMUSCULAR | Qty: 1

## 2023-01-20 MED FILL — LISINOPRIL 20 MG PO TABS: 20 MG | ORAL | Qty: 2

## 2023-01-20 MED FILL — PANTOPRAZOLE SODIUM 40 MG IV SOLR: 40 MG | INTRAVENOUS | Qty: 40

## 2023-01-20 NOTE — Plan of Care (Signed)
Problem: Discharge Planning  Goal: Discharge to home or other facility with appropriate resources  Outcome: Progressing     Problem: Pain  Goal: Verbalizes/displays adequate comfort level or baseline comfort level  01/20/2023 0746 by Marikay Alar, RN  Outcome: Progressing  01/20/2023 0143 by Shari Prows, RN  Outcome: Progressing     Problem: ABCDS Injury Assessment  Goal: Absence of physical injury  01/20/2023 0746 by Marikay Alar, RN  Outcome: Progressing  01/20/2023 0143 by Shari Prows, RN  Outcome: Progressing     Problem: Skin/Tissue Integrity  Goal: Absence of new skin breakdown  Description: 1.  Monitor for areas of redness and/or skin breakdown  2.  Assess vascular access sites hourly  3.  Every 4-6 hours minimum:  Change oxygen saturation probe site  4.  Every 4-6 hours:  If on nasal continuous positive airway pressure, respiratory therapy assess nares and determine need for appliance change or resting period.  01/20/2023 0746 by Marikay Alar, RN  Outcome: Progressing  01/20/2023 0143 by Shari Prows, RN  Outcome: Progressing     Problem: Safety - Adult  Goal: Free from fall injury  01/20/2023 0746 by Marikay Alar, RN  Outcome: Progressing  01/20/2023 0143 by Shari Prows, RN  Outcome: Progressing     Problem: Chronic Conditions and Co-morbidities  Goal: Patient's chronic conditions and co-morbidity symptoms are monitored and maintained or improved  01/20/2023 0746 by Marikay Alar, RN  Outcome: Progressing  01/20/2023 0143 by Shari Prows, RN  Outcome: Progressing

## 2023-01-20 NOTE — Discharge Instructions (Addendum)
HOSPITALIST DISCHARGE INSTRUCTIONS  NAME: Melinda [name]   DOB:  Feb 18, 1981   MRN:  [id number]     Date/Time:  01/20/2023 1:49 PM    ADMIT DATE: 01/13/2023     DISCHARGE DATE: 01/20/2023     ADMITTING DIAGNOSIS:  Anemia    DISCHARGE DIAGNOSIS:  Same        Anemia: Instrucciones de cuidado  Anemia: Care Instructions  Generalidades     La anemia es un nivel bajo de glbulos rojos, que transportan oxgeno por todo el cuerpo. Muchas cosas pueden causar anemia. La falta de hierro es una de las causas ms comunes. El cuerpo necesita hierro para Printmaker. Esta es una sustancia de los glbulos rojos que transporta oxgeno de los pulmones a las clulas del cuerpo. Sin el hierro suficiente, el cuerpo produce menos glbulos rojos y de Customer service manager. Como resultado, las clulas del cuerpo no reciben suficiente oxgeno, y se siente cansado y dbil. Y puede tener problemas de concentracin.  El sangrado es la causa ms comn de la falta de hierro. Puede tener sangrado menstrual intenso o sangrado causado por afecciones como lceras o cncer. El uso regular de aspirina u otros medicamentos antiinflamatorios (como el ibuprofeno) tambin puede provocar sangrados en [employer]. La falta de hierro en la dieta tambin puede causar anemia, especialmente en momentos en los que el cuerpo necesita ms hierro. Esto incluye durante el embarazo, la infancia y los aos de Psychologist, educational.  Es posible que su mdico le haya recetado pastillas de hierro. El tratamiento puede tomar algunos meses hasta que sus niveles de hierro regresen a la normalidad. Su mdico tambin puede sugerirle que consuma alimentos ricos en hierro, como carne y frijoles (habichuelas).  Existen muchas otras causas de la anemia. No siempre es causada por la falta de hierro. Descubrir la causa especfica de su anemia le ayudar a su mdico a Contractor tratamiento adecuado para usted.  La atencin de seguimiento es una parte clave de su tratamiento y  seguridad. Asegrese de Radio producer y acudir a todas las citas, y llame a su mdico si est teniendo problemas. Tambin es una buena idea saber los resultados de sus exmenes y Pharmacologist una lista de los medicamentos que toma.  Cmo puede cuidarse en el hogar?  [employer] medicamentos exactamente como le fueron recetados. Llame al mdico si cree estar teniendo problemas con su medicamento.  Si el mdico recomienda pastillas de hierro, tmelas segn las indicaciones.  Trate de tomar las pastillas con el estmago vaco 1 hora antes de comer o 2 horas despus de comer. Pero podra necesitar tomar el hierro con la comida para Programme researcher, broadcasting/film/video.  No tome anticidos, [place] o bebidas con cafena (como caf, t o bebidas de cola) al [employer] o 2 horas antes o despus de haber tomado las pastillas de hierro. Estos pueden dificultar la absorcin del Development worker, community.  La vitamina C (procedente de alimentos o suplementos) puede ayudar al cuerpo a absorber el hierro. Algunas personas toman las pastillas de hierro con un vaso de jugo de naranja o algn otro alimento rico en vitamina C, como los ctricos.  Las pastillas de hierro podran causar problemas estomacales, como acidez [place], nuseas, diarrea, estreimiento y retortijones. Asegrese de beber abundantes lquidos e incluya en su dieta diaria frutas, verduras y [place]. Las pastillas de hierro muchas veces hacen que sus evacuaciones sean oscuras o verdosas.  Si olvida tomar su pastilla de hierro, no tome una dosis  doble la prxima vez.  Mantenga las pastillas de hierro fuera del alcance de los nios pequeos. La sobredosis de hierro puede ser [employer].  Es posible que el mdico le recomiende comer ms alimentos ricos en hierro. Entre ellos se encuentran la carne roja, las aves de corral, los [place], las [place], las pasas, el pan integral y las verduras de Therapist, art. Algunos cereales y granos enriquecidos tienen un alto contenido en hierro.  Cundo  debe pedir ayuda?   Llame al 911 en cualquier momento que considere que necesita atencin de [place]. Por ejemplo, llame si:    Tiene sntomas de un ataque al corazn. Estos podran incluir:  Dolor o presin en el pecho, o una sensacin extraa en el pecho.  Sudoracin.  Falta de aire.  Nuseas o vmito.  Dolor, presin o una sensacin extraa en la espalda, el cuello, la mandbula, la parte superior del abdomen o en uno o ambos hombros o brazos.  Aturdimiento o debilidad repentina.  Latidos del corazn rpidos o irregulares.  Despus de llamar al 911, es posible que el operador le diga que mastique 1 aspirina para adultos o de 2 a 4 aspirinas de dosis baja. Espere a una ambulancia. No intente conducir usted mismo.     Se desmay (perdi el conocimiento).   Llame a su mdico ahora mismo o busque atencin mdica inmediata si:    Presenta nueva o mayor falta de aire.     Siente mareos o aturdimiento, o que est a punto de Barista.     La fatiga y la debilidad continan o empeoran.     Tiene cualquier sangrado anormal, como:  Hemorragias (sangrado) nasales.  Sangrado vaginal que es distinto (ms intenso, ms frecuente, en un momento diferente del mes) del que usted [place].  Heces sanguinolentas o negras, o sangrado por el recto.  Orina sanguinolenta o de color rosado.   Preste especial atencin a los cambios en su salud y asegrese de comunicarse con su mdico si:    No mejora como se esperaba.   Dnde puede encontrar ms informacin?  Vaya a [email or web address] e escriba R301 para ms informacin sobre "Anemia: Instrucciones de cuidado."  Revisado: 13 diciembre, 2023               Versin del contenido: 14.0   2006-2024 Healthwise, Incorporated.   Las instrucciones de cuidado fueron adaptadas bajo licencia por [employer]. Si usted tiene preguntas sobre una afeccin mdica o sobre estas instrucciones, siempre pregunte a su profesional de salud. Healthwise, Incorporated niega  toda garanta o responsabilidad por su uso de esta informacin.        MEDICATIONS:    It is important that you take the medication exactly as they are prescribed.   Keep your medication in the bottles provided by the pharmacist and keep a list of the medication names, dosages, and times to be taken in your wallet.   Do not take other medications without consulting your doctor     Pain Management: per above medications    What to do at Home    Recommended diet:  cardiac diet    Recommended activity: activity as tolerated    1) Return to the hospital if you feel worse    2) If you experience any of the following symptoms then please call your primary care physician or return to the emergency room if you cannot get hold of your doctor:  Fever, chills, nausea, vomiting, diarrhea, change in mentation, falling,  bleeding, shortness of breath, chest pain, severe headache, severe abdominal pain.     Follow Up:  Primary Care Doctor  Follow up  Please follow up with your primarycare doctor within 1 week.    [facility]  [address]  [place] [address]  [phone]  Schedule an appointment as soon as possible for a visit in 2 week(s)  Hospital Follow Up, As needed      Information obtained by :  I understand that if any problems occur once I am at home I am to contact my physician.    I understand and acknowledge receipt of the instructions indicated above.                                                                                                                                           Physician's or R.N.'s Signature                                                                  Date/Time                                                                                                                                              Patient or Energy manager                                                          Date/Time

## 2023-01-20 NOTE — Progress Notes (Signed)
Eucharistic ministry visit attempted.  [name] was in bed. She was being attended to by staff so unavailable for a visit. 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 Prayer for spiritual communion offered outside her room.    Chaplain Sr. Verdene Lennert, SBS, RN, ACSW, LCSW  Chaplain Page:  [phone])

## 2023-01-20 NOTE — Other (Signed)
Dr Brent Bulla notified via text re: pt received Iron infusion 01/19/23 at 12 noon. Awaiting call back.

## 2023-01-20 NOTE — Plan of Care (Signed)
Problem: ABCDS Injury Assessment  Goal: Absence of physical injury  Outcome: Progressing     Problem: Pain  Goal: Verbalizes/displays adequate comfort level or baseline comfort level  Outcome: Progressing     Problem: Skin/Tissue Integrity  Goal: Absence of new skin breakdown  Description: 1.  Monitor for areas of redness and/or skin breakdown  2.  Assess vascular access sites hourly  3.  Every 4-6 hours minimum:  Change oxygen saturation probe site  4.  Every 4-6 hours:  If on nasal continuous positive airway pressure, respiratory therapy assess nares and determine need for appliance change or resting period.  Outcome: Progressing     Problem: Safety - Adult  Goal: Free from fall injury  Outcome: Progressing

## 2023-01-20 NOTE — Progress Notes (Signed)
Physician Progress Note      PATIENT:               Melinda [name], Melinda [name]  CSN #:                  [id number]  DOB:                       1981-07-10  ADMIT DATE:       01/13/2023 9:13 PM  DISCH DATE:        01/20/2023 5:24 PM  RESPONDING  PROVIDER #:        Horald Chestnut DO          QUERY TEXT:    Good Morning    This patient admitted on 01/13/2023-01/20/2023- for Abdominal Pain.    The H&P notes "Sepsis".  but then this diagnosis was dropped by the attending   in the progress notes and discharge has "SIRS"    Discharge summary notes "SIRS POA, Leukocytosis and tachycardia. Lactic acid   elevated. Blood cx negative. Was on abx, which was d/c. Urine cx. Staph   epidermidis which is skin flora and is a contamination.    If possible, please document in the progress notes and discharge summary if   Sepsis POA  was:    The medical record reflects the following:  Risk Factors:  Clinical Indicators: Pt presented to ER with abdominal pain, and back pain, WC   12.2---Upon arrival, HR 101-106--Lactic acid 2.63--Creat 1.45--H&P ? UTI.   Started on Ceftriazone and urine cx sent. Discharge summary notes SIRS POA:   Leukocytosis and Tachycardia. Lactic acid elevated.  Resolved. Blood cultures   remain negative. Was on Abx, which was discontinued. UC: Staph epidermidis,   which is skin flora and a contamination. Discharge summary notes abnormal UA,   but UTI  Treatment: IV Rocephin, Urine cx, blood cx, IVF, Lactic acid assessed, Gi   consulted, Colonoscopy and EGD.    Thank you  Leroy Kennedy, BSN,RN,CPHQ, CCDS ,SMART  Options provided:  -- Sepsis POA is confirmed after study: Sepsis POA is confirmed after study.  -- Sepsis POA is treated and resolved  -- Sepsis POA is ruled out after study. SIRS of a non-infectious process with   organ dysfunction  -- Sepsis POA is ruled out after study. SIRS of a non-infectious process   without organ dysfunction  -- Other - I will add my own diagnosis  -- Disagree - Not applicable / Not valid  -- Disagree  - Clinically unable to determine / Unknown  -- Refer to Clinical Documentation Reviewer    PROVIDER RESPONSE TEXT:    Sepsis POA is ruled out after study. SIRS of non-infectious process with organ   dysfunction    Query created by: Leroy Kennedy on 01/24/2023 8:19 AM      Electronically signed by:  Horald Chestnut DO 01/28/2023 12:05 PM

## 2023-01-20 NOTE — Progress Notes (Signed)
Patient/caregivers speak Spanish as their preferred language for their healthcare communication. For safe communication, use the [employer] interpreter carts or call:    Maria Claudia Oystese   Senior Interpreter-Navigator [phone]  General phone: [phone] ( [phone])  Email: [email or web address]    Please always document the use of interpreting services (Interpreter's ID number) in your clinical notes.    Our interpreters are available for team members working with limited  English proficient (LEP) patients remotely, via phone or video or in person (if needed for special cases).

## 2023-01-20 NOTE — Other (Signed)
Pill cam:    CAPSUL ID# [id number]  LOT# [id number]  Exp: 03/22/2024    Patient swallowed pill cam at 0823AM. Tolerated well. No pain. No distress. No difficulty swallowing.

## 2023-01-20 NOTE — Other (Signed)
TRANSFER - OUT REPORT:    Verbal report given to [facility] RN on [employer] Toto-Ambros      Report consisted of patient's Situation, Background, Assessment and   Recommendations(SBAR).     Information from the following report(s) Nurse Handoff Report and Intake/Output was reviewed with the receiving nurse.           Opportunity for questions and clarification was provided.        Patient may start:    CLEAR LIQUIDS at 1023am  LIGHT MEAL if able to/if started on a diet at 1223PM

## 2023-01-20 NOTE — Other (Signed)
Report received from [facility]      TRANSFER - IN REPORT:    Verbal report received from [facility] on Melinda [name]  for ordered procedure      Report consisted of patient's Situation, Background, Assessment and   Recommendations(SBAR).     Information from the following report(s) Nurse Handoff Report and Intake/Output was reviewed with the receiving nurse.    Opportunity for questions and clarification was provided.

## 2023-01-20 NOTE — Op Note (Signed)
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 after midnight; PillCam system was used    Findings  Stomach-gas transit time 3 hours, benign-appearing gastric antral erosion without evidence of active or recent bleeding  Small intestine-small bowel transit time 3 hours and 50 minutes, no focal lesions, clear review secondary to some food debris in the distal one third is visualized small bowel, no evidence of active or recent bleeding lesions  Colon/cecum-grossly unremarkable with limited views    Summary  VCE per oral with normal small bowel imaging  If recurrent overt GI bleeding or recurrent anemia, repeat EGD and if negative consider CT or MR enterography to assess for mass lesions in the small bowel

## 2023-01-20 NOTE — Plan of Care (Signed)
Patient discharged home, VSS. No complaints of pain. PIV removed. Discharge paperwork reviewed with the patient and family in detail. No questions or concerns.     Problem: Discharge Planning  Goal: Discharge to home or other facility with appropriate resources  01/20/2023 1646 by Marikay Alar, RN  Outcome: Completed  01/20/2023 0746 by Marikay Alar, RN  Outcome: Progressing     Problem: Pain  Goal: Verbalizes/displays adequate comfort level or baseline comfort level  01/20/2023 1646 by Marikay Alar, RN  Outcome: Completed  01/20/2023 0746 by Marikay Alar, RN  Outcome: Progressing     Problem: ABCDS Injury Assessment  Goal: Absence of physical injury  01/20/2023 1646 by Marikay Alar, RN  Outcome: Completed  01/20/2023 0746 by Marikay Alar, RN  Outcome: Progressing     Problem: Skin/Tissue Integrity  Goal: Absence of new skin breakdown  Description: 1.  Monitor for areas of redness and/or skin breakdown  2.  Assess vascular access sites hourly  3.  Every 4-6 hours minimum:  Change oxygen saturation probe site  4.  Every 4-6 hours:  If on nasal continuous positive airway pressure, respiratory therapy assess nares and determine need for appliance change or resting period.  01/20/2023 1646 by Marikay Alar, RN  Outcome: Completed  01/20/2023 0746 by Marikay Alar, RN  Outcome: Progressing     Problem: Safety - Adult  Goal: Free from fall injury  01/20/2023 1646 by Marikay Alar, RN  Outcome: Completed  01/20/2023 0746 by Marikay Alar, RN  Outcome: Progressing     Problem: Chronic Conditions and Co-morbidities  Goal: Patient's chronic conditions and co-morbidity symptoms are monitored and maintained or improved  01/20/2023 1646 by Marikay Alar, RN  Outcome: Completed  01/20/2023 0746 by Marikay Alar, RN  Outcome: Progressing

## 2023-01-20 NOTE — Discharge Summary (Signed)
[facility]  [address] [place], [place], [place] [address]  [phone]          Hospitalist Discharge Summary     Patient ID:  Melinda [name]  [id number]  42 y.o.  15-Mar-1981    Admit date: 01/13/2023    Discharge date and time: 01/20/2023 2:04 PM    Admission Diagnoses: Septicemia (HCC) [A41.9]  Sepsis (HCC) [A41.9]  Abdominal pain, unspecified abdominal location [R10.9]  Urinary tract infection without hematuria, site unspecified [N39.0]  Anemia, unspecified type [D64.9]    Discharge Diagnoses:  Principal Diagnosis Sepsis (HCC)                                            Principal Problem:    Sepsis (HCC)  Active Problems:    Abdominal pain    UTI (urinary tract infection)    AKI (acute kidney injury) (HCC)    Leukocytosis    Elevated lactic acid level    Tachycardia    DM (diabetes mellitus) (HCC)    HTN (hypertension)    Hyperlipidemia  Resolved Problems:    * No resolved hospital problems. *         Hospital Course:     Epigastric abdominal pain/melena POA:  Unclear etiology. EGD 4/08 w/ retained food in stomach; repeat EGD 4/09 didn't show any acute concerns. Colonoscopy 4/10 w/ no acute concerns. Sp pillcam 4/10. Gi evaluted; FU OP      Microcytic anemia POA:  Possible GI blood loss.  Sp Iv iron; continue oral iron. FU OP      SIRS POA: Leukocytosis and Tachycardia. Lactic acid elevated.  Resolved. Blood cultures remain negative. Was on Abx, which was discontinued. UC: Staph epidermidis, which is skin flora and a contamination.        Abnormal UA POA: Not UTI. UA with Many epithelial cells and UC: staph epidermidis. Likely contamination. Stopped Abx. No UTI on admission.      AKI (acute kidney injury) (HCC) vs CKD POA:  Cr has been stable; this may be new baseline. Resume home lisinopril, aldactone. Continue amlodipine and BB. FU OP        DM (diabetes mellitus) (HCC).  Continue home insulin. FU OP       HTN (hypertension).  Resume home lisinopril, aldactone. Continue  amlodipine and BB. FU OP       Hyperlipidemia.  Most Recent LDL ok. Not on statin or fenofibrate OP any longer. FU OP     PCP: No primary care provider on file.     Consults: GI    Significant Diagnostic Studies:     EGD  Findings:   Esophagus-normal-appearing esophagus and GE junction   Stomach-normal-appearing stomach  Duodenum-normal examined duodenum to the D2 segment    Colonoscopy   Impression:  Full colonoscopy including 10 cm terminal ileum intubation, no evidence of active or recent bleeding lesions, only small internal hemorrhoids noted    Discharge Exam:  Physical Exam:    Gen:  Well-developed, well-nourished, in no acute distress  HEENT:  Pink conjunctivae, PERRL, hearing intact to voice  Resp:  No accessory muscle use, clear breath sounds without wheezes rales or rhonchi  Card:  No murmurs, normal S1, S2 without thrills, bruits or peripheral edema  Abd:  Soft, non-tender, non-distended, normoactive bowel sounds are present  Lymph:  No cervical  or inguinal adenopathy  Musc:  No cyanosis or clubbing  Skin:  No rashes or ulcers, skin turgor is good  Neuro:  Cranial nerves are grossly intact, no focal motor weakness, follows commands appropriately  Psych: fair insight     Disposition: home  Discharge Condition: Stable    Patient Instructions:      Medication List        START taking these medications      naloxone 4 MG/0.1ML Liqd nasal spray  Commonly known as: Narcan  1 spray by Nasal route as needed for Opioid Reversal     ondansetron 4 MG disintegrating tablet  Commonly known as: ZOFRAN-ODT  Take 1 tablet by mouth every 8 hours as needed for Nausea or Vomiting     oxyCODONE 5 MG immediate release tablet  Commonly known as: ROXICODONE  Take 1 tablet by mouth every 8 hours as needed for Pain for up to 3 days. Max Daily Amount: 15 mg     polyethylene glycol 17 g packet  Commonly known as: GLYCOLAX  Take 1 packet by mouth daily as needed for Constipation            CHANGE how you take these medications       ferrous sulfate 325 (65 Fe) MG tablet  Commonly known as: IRON 325  Take 1 tablet by mouth every morning (before breakfast)  What changed: how much to take     metoprolol succinate 50 MG extended release tablet  Commonly known as: TOPROL XL  Take 1 tablet by mouth daily  Start taking on: January 21, 2023  What changed:   medication strength  how much to take            CONTINUE taking these medications      amLODIPine 10 MG tablet  Commonly known as: NORVASC     lisinopril 40 MG tablet  Commonly known as: PRINIVIL;ZESTRIL     NovoLIN 70/30 (70-30) 100 UNIT per ML injection vial  Generic drug: insulin 70-30     spironolactone 25 MG tablet  Commonly known as: ALDACTONE            STOP taking these medications      hydroCHLOROthiazide 25 MG tablet  Commonly known as: HYDRODIURIL               Where to Get Your Medications        These medications were sent to [facility] [address], [place] [address] [place] [address] [place] [phone] - [phone]  [address] [place], [place] [address]      Phone: [phone]   ferrous sulfate 325 (65 Fe) MG tablet  metoprolol succinate 50 MG extended release tablet  naloxone 4 MG/0.1ML Liqd nasal spray  ondansetron 4 MG disintegrating tablet  oxyCODONE 5 MG immediate release tablet  polyethylene glycol 17 g packet        Activity: activity as tolerated  Diet: cardiac diet  Wound Care: none needed    Follow-up with  Primary Care Doctor  Follow up  Please follow up with your primarycare doctor within 1 week.    [facility]  [address]  [place] [address]  [phone]  Schedule an appointment as soon as possible for a visit in 2 week(s)  Hospital Follow Up, As needed       Follow-up tests/labs as above.    Signed:  Horald Chestnut, DO  01/20/2023  2:04 PM  **I personally spent 35  min on discharge**

## 2023-01-20 NOTE — Consults (Signed)
Session ID: [id number]  Language: Spanish  Interpreter ID: #[id number]  Interpreter Name: Christiane Ha

## 2023-01-20 NOTE — Other (Signed)
PillCam recorder retrieved from patient @ approximately 1625. Data download started in Endoscopy.

## 2023-01-20 NOTE — Care Coordination-Inpatient (Signed)
01/20/2023  3:19 PM  Care Management Progress Note      ICD-10-CM    1. Septicemia (HCC)  A41.9       2. Urinary tract infection without hematuria, site unspecified  N39.0       3. Abdominal pain, unspecified abdominal location  R10.9 oxyCODONE (ROXICODONE) 5 MG immediate release tablet      4. Anemia, unspecified type  D64.9           RUR:  11%  Risk Level: [x] Low [] Moderate [] High    Transition of care plan:  Discharge order submitted. Pt has medically cleared.   Home - No CM needs indicated.   Outpatient follow-up. PCP listing provided to pt.   Pt's family to transport.       01/14/23 1105   Service Assessment   Patient Orientation Alert and Oriented   Cognition Alert   History Provided By Patient;Child/Family   Primary Caregiver Self   Accompanied By/Relationship daughter Toney Reil   Support Systems Spouse/Significant Other;Children   Patient's Healthcare Decision Maker is: Armed forces operational officer Next of Kin  (husband Bearl Mulberry [phone])   PCP Verified by CM Yes  (Dr. Katrinka Blazing)   Last Visit to PCP Within last 3 months   Prior Functional Level Independent in ADLs/IADLs   Can patient return to prior living arrangement Yes   Ability to make needs known: Good   Family able to assist with home care needs: Yes   Financial Resources Other (Comment)  (pt applied for medicaid, care fund info given)   Programmer, applications None   Social/Functional History   Lives With Spouse;Daughter   Type of Home Trailer   Home Layout One level   Home Access Stairs to enter with rails   Entrance Stairs - Number of Steps 5   Bathroom Equipment None   Home Equipment None   ADL Assistance Independent   Mudlogger Yes   Ambulation Assistance Independent   Transfer Assistance Independent   Active Driver No   Patient's Driver Info daughter [place]   Discharge Planning   Type of Residence Trailer/Mobile Home   Living Arrangements Spouse/Significant Other;Children   Current Services Prior To Admission None    Potential Assistance Needed N/A   DME Ordered? No   Potential Assistance Purchasing Medications No   Type of Home Care Services None   Patient expects to be discharged to: Trailer/mobile home   One/Two Story Residence One story   Services At/After Discharge   Services At/After Discharge None   [employer] Information Provided? No   Mode of Transport at Discharge Other (see comment)  (daughter daisy)   Confirm Follow Up Transport Family

## 2023-02-01 ENCOUNTER — Emergency Department: Admit: 2023-02-02 | Payer: MEDICAID | Primary: Adult Health

## 2023-02-01 ENCOUNTER — Inpatient Hospital Stay
Admission: EM | Admit: 2023-02-01 | Discharge: 2023-02-04 | Disposition: A | Discharge status: Home / Self Care | Payer: MEDICAID | Admitting: Internal Medicine

## 2023-02-01 ENCOUNTER — Emergency Department: Admit: 2023-02-01 | Payer: MEDICAID | Primary: Adult Health

## 2023-02-01 DIAGNOSIS — K828 Other specified diseases of gallbladder: Secondary | ICD-10-CM

## 2023-02-01 DIAGNOSIS — R1013 Epigastric pain: Secondary | ICD-10-CM

## 2023-02-01 LAB — COMPREHENSIVE METABOLIC PANEL
ALT: 25 U/L (ref 12–78)
AST: 35 U/L (ref 15–37)
Albumin/Globulin Ratio: 0.7 — ABNORMAL LOW (ref 1.1–2.2)
Albumin: 3.1 g/dL — ABNORMAL LOW (ref 3.5–5.0)
Alk Phosphatase: 99 U/L (ref 45–117)
Anion Gap: 8 mmol/L (ref 5–15)
BUN/Creatinine Ratio: 13 (ref 12–20)
BUN: 24 MG/DL — ABNORMAL HIGH (ref 6–20)
CO2: 22 mmol/L (ref 21–32)
Calcium: 9.1 MG/DL (ref 8.5–10.1)
Chloride: 108 mmol/L (ref 97–108)
Creatinine: 1.92 MG/DL — ABNORMAL HIGH (ref 0.55–1.02)
Est, Glom Filt Rate: 33 mL/min/{1.73_m2} — ABNORMAL LOW (ref >60)
Globulin: 4.6 g/dL — ABNORMAL HIGH (ref 2.0–4.0)
Glucose: 246 mg/dL — ABNORMAL HIGH (ref 65–100)
Potassium: 4.2 mmol/L (ref 3.5–5.1)
Sodium: 138 mmol/L (ref 136–145)
Total Bilirubin: 0.4 MG/DL (ref 0.2–1.0)
Total Protein: 7.7 g/dL (ref 6.4–8.2)

## 2023-02-01 LAB — CBC WITH AUTO DIFFERENTIAL
Basophils %: 1 % (ref 0–1)
Basophils Absolute: 0.1 10*3/uL (ref 0.0–0.1)
Eosinophils %: 2 % (ref 0–7)
Eosinophils Absolute: 0.2 10*3/uL (ref 0.0–0.4)
Hematocrit: 31.9 % — ABNORMAL LOW (ref 35.0–47.0)
Hemoglobin: 10.4 g/dL — ABNORMAL LOW (ref 11.5–16.0)
Immature Granulocytes %: 0 % (ref 0.0–0.5)
Immature Granulocytes Absolute: 0 10*3/uL (ref 0.00–0.04)
Lymphocytes %: 15 % (ref 12–49)
Lymphocytes Absolute: 1.3 10*3/uL (ref 0.8–3.5)
MCH: 24.9 PG — ABNORMAL LOW (ref 26.0–34.0)
MCHC: 32.6 g/dL (ref 30.0–36.5)
MCV: 76.5 FL — ABNORMAL LOW (ref 80.0–99.0)
MPV: 9.6 FL (ref 8.9–12.9)
Monocytes %: 4 % — ABNORMAL LOW (ref 5–13)
Monocytes Absolute: 0.4 10*3/uL (ref 0.0–1.0)
Neutrophils %: 78 % — ABNORMAL HIGH (ref 32–75)
Neutrophils Absolute: 6.9 10*3/uL (ref 1.8–8.0)
Nucleated RBCs: 0 PER 100 WBC
Platelets: 562 10*3/uL — ABNORMAL HIGH (ref 150–400)
RBC: 4.17 M/uL (ref 3.80–5.20)
RDW: 20.4 % — ABNORMAL HIGH (ref 11.5–14.5)
WBC: 8.9 10*3/uL (ref 3.6–11.0)
nRBC: 0 10*3/uL (ref 0.00–0.01)

## 2023-02-01 LAB — TROPONIN: Troponin, High Sensitivity: 9 ng/L (ref 0–51)

## 2023-02-01 LAB — LIPASE: Lipase: 44 U/L (ref 13–75)

## 2023-02-01 LAB — EXTRA TUBES HOLD

## 2023-02-01 LAB — MAGNESIUM: Magnesium: 2.2 mg/dL (ref 1.6–2.4)

## 2023-02-01 LAB — POC LACTIC ACID: POC Lactic Acid: 1.02 mmol/L (ref 0.40–2.00)

## 2023-02-01 MED ORDER — SODIUM CHLORIDE (PF) 0.9 % IJ SOLN
0.9 | Freq: Two times a day (BID) | INTRAMUSCULAR | Status: DC
Start: 2023-02-01 — End: 2023-02-01

## 2023-02-01 MED ORDER — SODIUM CHLORIDE (PF) 0.9 % IJ SOLN
0.9 | Freq: Once | INTRAMUSCULAR | Status: AC
Start: 2023-02-01 — End: 2023-02-01
  Administered 2023-02-01: 22:00:00 20 mg via INTRAVENOUS

## 2023-02-01 MED ORDER — ONDANSETRON HCL 4 MG/2ML IJ SOLN
4 | INTRAMUSCULAR | Status: AC
Start: 2023-02-01 — End: 2023-02-01
  Administered 2023-02-01: 22:00:00 4 mg via INTRAVENOUS

## 2023-02-01 MED ORDER — SODIUM CHLORIDE 0.9 % IV BOLUS
0.9 | Freq: Once | INTRAVENOUS | Status: AC
Start: 2023-02-01 — End: 2023-02-01
  Administered 2023-02-01: 22:00:00 1000 mL via INTRAVENOUS

## 2023-02-01 MED ORDER — PROCHLORPERAZINE EDISYLATE 10 MG/2ML IJ SOLN
10 | INTRAMUSCULAR | Status: AC
Start: 2023-02-01 — End: 2023-02-01
  Administered 2023-02-01: 10 mg via INTRAVENOUS

## 2023-02-01 MED ORDER — KETOROLAC TROMETHAMINE 15 MG/ML IJ SOLN
15 | INTRAMUSCULAR | Status: AC
Start: 2023-02-01 — End: 2023-02-01
  Administered 2023-02-01: 22:00:00 15 mg via INTRAVENOUS

## 2023-02-01 MED ORDER — MORPHINE SULFATE (PF) 4 MG/ML IJ SOLN
4 | INTRAMUSCULAR | Status: AC
Start: 2023-02-01 — End: 2023-02-01
  Administered 2023-02-01: 23:00:00 4 mg via INTRAVENOUS

## 2023-02-01 MED FILL — MORPHINE SULFATE 4 MG/ML IJ SOLN: 4 mg/mL | INTRAMUSCULAR | Qty: 1

## 2023-02-01 MED FILL — PROCHLORPERAZINE EDISYLATE 10 MG/2ML IJ SOLN: 10 MG/2ML | INTRAMUSCULAR | Qty: 2

## 2023-02-01 MED FILL — ONDANSETRON HCL 4 MG/2ML IJ SOLN: 4 MG/2ML | INTRAMUSCULAR | Qty: 2

## 2023-02-01 MED FILL — KETOROLAC TROMETHAMINE 15 MG/ML IJ SOLN: 15 MG/ML | INTRAMUSCULAR | Qty: 1

## 2023-02-01 MED FILL — FAMOTIDINE (PF) 20 MG/2ML IV SOLN: 20 MG/2ML | INTRAVENOUS | Qty: 2

## 2023-02-01 MED FILL — SODIUM CHLORIDE 0.9 % IV SOLN: 0.9 % | INTRAVENOUS | Qty: 1000

## 2023-02-01 NOTE — ED Triage Notes (Signed)
Patient family stated patient started to have abdominal pain with vomiting that started yesterday. Patient denies fevers. Patient states noone else has been sick and does not think that she ate anything.     LBM Yesterday that looked normal. Patient states pain is epigastric that radiates both sides to the back. Pain is not worse on palpation. No blood present.

## 2023-02-01 NOTE — Plan of Care (Signed)
Problem: Pain  Goal: Verbalizes/displays adequate comfort level or baseline comfort level  Outcome: Progressing     Problem: Safety - Adult  Goal: Free from fall injury  Outcome: Progressing

## 2023-02-01 NOTE — ED Notes (Signed)
Delay in transport due to medicating pt. Provider made aware of 7 out of 10 pain level. Awaiting response for a potential intervention to help alleviate pain

## 2023-02-01 NOTE — ED Provider Notes (Signed)
[facility] DEPT  EMERGENCY DEPARTMENT ENCOUNTER      Pt Name: Melinda [name]  MRN: [id number]  Birthdate 1981-07-23  Date of evaluation: 02/01/2023  Provider: Lesia Sago, MD    CHIEF COMPLAINT       Chief Complaint   Patient presents with    Abdominal Pain    Emesis         HISTORY OF PRESENT ILLNESS   (Location/Symptom, Timing/Onset, Context/Setting, Quality, Duration, Modifying Factors, Severity)  Note limiting factors.   42 year old female presents from home accompanied by her daughter with complaints of abdominal pain and vomiting.  Pain started 2 days ago and then progressed to vomiting.  No known fever at home.  No diarrhea.  Patient has a history significant for type 2 diabetes.  She was admitted here earlier in the month with abdominal pain and vomiting.  Found to be septic.  Treated for urinary tract infection.  Was having epigastric pain and underwent EGD which was largely unremarkable.  She was scheduled to follow-up with gastroenterology in outpatient.    The history is provided by the patient and medical records.         Review of External Medical Records:     Nursing Notes were reviewed.    REVIEW OF SYSTEMS    (2-9 systems for level 4, 10 or more for level 5)     Review of Systems   Constitutional:  Negative for fatigue.   HENT:  Negative for sore throat.    Eyes:  Negative for visual disturbance.   Respiratory:  Negative for cough.    Cardiovascular:  Negative for palpitations.   Gastrointestinal:  Negative for vomiting.   Genitourinary:  Negative for difficulty urinating.   Musculoskeletal:  Negative for myalgias.   Skin:  Negative for rash.   Neurological:  Negative for weakness.       Except as noted above the remainder of the review of systems was reviewed and negative.       PAST MEDICAL HISTORY     Past Medical History:   Diagnosis Date    High blood pressure     High cholesterol     Type 2 diabetes mellitus (HCC)     Controled         SURGICAL HISTORY       Past Surgical  History:   Procedure Laterality Date    CAPSULE ENDOSCOPY N/A 01/20/2023    ESOPHAGEAL CAPSULE ENDOSCOPY remove at 1624PM performed by Glyn Ade, MD at [facility] ENDOSCOPY    COLONOSCOPY N/A 01/19/2023    COLONOSCOPY DIAGNOSTIC performed by Glyn Ade, MD at [facility] ENDOSCOPY    OTHER SURGICAL HISTORY Left     Rentia attachment    UPPER GASTROINTESTINAL ENDOSCOPY N/A 01/17/2023    ESOPHAGOGASTRODUODENOSCOPY performed by Glyn Ade, MD at [facility] ENDOSCOPY    UPPER GASTROINTESTINAL ENDOSCOPY N/A 01/17/2023    ESOPHAGOGASTRODUODENOSCOPY BIOPSY performed by Glyn Ade, MD at [facility] ENDOSCOPY    UPPER GASTROINTESTINAL ENDOSCOPY N/A 01/18/2023    ESOPHAGOGASTRODUODENOSCOPY performed by Glyn Ade, MD at [facility] ENDOSCOPY         CURRENT MEDICATIONS       Previous Medications    AMLODIPINE (NORVASC) 10 MG TABLET    Take 1 tablet by mouth daily    FERROUS SULFATE (IRON 325) 325 (65 FE) MG TABLET    Take 1 tablet by mouth every morning (before breakfast)    INSULIN 70-30 (NOVOLIN 70/30) (70-30) 100 UNIT PER ML INJECTION VIAL  Inject 15 Units into the skin 2 times daily    LISINOPRIL (PRINIVIL;ZESTRIL) 40 MG TABLET    Take 1 tablet by mouth daily    METOPROLOL SUCCINATE (TOPROL XL) 50 MG EXTENDED RELEASE TABLET    Take 1 tablet by mouth daily    NALOXONE (NARCAN) 4 MG/0.1ML LIQD NASAL SPRAY    1 spray by Nasal route as needed for Opioid Reversal    ONDANSETRON (ZOFRAN-ODT) 4 MG DISINTEGRATING TABLET    Take 1 tablet by mouth every 8 hours as needed for Nausea or Vomiting    POLYETHYLENE GLYCOL (GLYCOLAX) 17 G PACKET    Take 1 packet by mouth daily as needed for Constipation    SPIRONOLACTONE (ALDACTONE) 25 MG TABLET    Take 1 tablet by mouth daily       ALLERGIES     Patient has no known allergies.    FAMILY HISTORY     No family history on file.       SOCIAL HISTORY       Social History     Socioeconomic History    Marital status: Married   Tobacco Use    Smoking status: Never    Smokeless tobacco: Never   Vaping Use    Vaping Use: Never  used   Substance and Sexual Activity    Alcohol use: Never    Drug use: Never           PHYSICAL EXAM    (up to 7 for level 4, 8 or more for level 5)     ED Triage Vitals [02/01/23 1650]   BP Temp Temp Source Pulse Respirations SpO2 Height Weight - Scale   (!) 180/102 98.6 F (37 C) Oral (!) 106 22 96 % 1.499 m ( ) 65.8 kg (145 lb)       Body mass index is 29.29 kg/m.    Physical Exam  Constitutional:       Appearance: She is ill-appearing.   HENT:      Head: Normocephalic.      Mouth/Throat:      Mouth: Mucous membranes are moist.   Eyes:      General: No scleral icterus.  Cardiovascular:      Rate and Rhythm: Tachycardia present.   Pulmonary:      Effort: Pulmonary effort is normal.   Abdominal:      General: There is no distension.   Musculoskeletal:         General: No deformity.      Cervical back: Neck supple.   Skin:     Findings: No rash.   Neurological:      General: No focal deficit present.      Mental Status: She is alert and oriented to person, place, and time.         DIAGNOSTIC RESULTS     EKG: All EKG's are interpreted by the Emergency Department Physician who either signs or Co-signs this chart in the absence of a cardiologist.        RADIOLOGY:   Non-plain film images such as CT, Ultrasound and MRI are read by the radiologist. Plain radiographic images are visualized and preliminarily interpreted by the emergency physician with the below findings:        Interpretation per the Radiologist below, if available at the time of this note:    XR CHEST PORTABLE   Final Result   No acute cardiopulmonary process.  LABS:  Labs Reviewed   CBC WITH AUTO DIFFERENTIAL - Abnormal; Notable for the following components:       Result Value    Hemoglobin 10.4 (*)     Hematocrit 31.9 (*)     MCV 76.5 (*)     MCH 24.9 (*)     RDW 20.4 (*)     Platelets 562 (*)     Neutrophils % 78 (*)     Monocytes % 4 (*)     All other components within normal limits   COMPREHENSIVE METABOLIC PANEL - Abnormal;  Notable for the following components:    Glucose 246 (*)     BUN 24 (*)     Creatinine 1.92 (*)     Est, Glom Filt Rate 33 (*)     Albumin 3.1 (*)     Globulin 4.6 (*)     Albumin/Globulin Ratio 0.7 (*)     All other components within normal limits   CULTURE, BLOOD 1   CULTURE, BLOOD 2   EXTRA TUBES HOLD   LIPASE   MAGNESIUM   TROPONIN   POC LACTIC ACID   URINALYSIS WITH REFLEX TO CULTURE   POCT LACTIC ACID       All other labs were within normal range or not returned as of this dictation.    EMERGENCY DEPARTMENT COURSE and DIFFERENTIAL DIAGNOSIS/MDM:   Vitals:    Vitals:    02/01/23 1830 02/01/23 1845 02/01/23 1851 02/01/23 1900   BP: (!) 196/102 (!) 196/104  (!) 209/101   Pulse: (!) 106 (!) 103 (!) 106 (!) 112   Resp: 20 17 16 18    Temp:       TempSrc:       SpO2: 100% 100% 100% 100%   Weight:       Height:               Medical Decision Making  Amount and/or Complexity of Data Reviewed  Labs: ordered.  Radiology: ordered.  ECG/medicine tests: ordered. Decision-making details documented in ED Course.    Risk  Prescription drug management.  Decision regarding hospitalization.            REASSESSMENT     ED Course as of 02/01/23 1910   Tue Feb 01, 2023   1745 EKG 12 Lead  ED ECG interpretation:  Rhythm: normal sinus rhythm. Rate (approx.): 101.  Axis: normal.  ST segment:  No concerning ST elevations or depressions. This EKG was independently interpreted by Domingo Cocking, MD,ED Provider.   [JM]      ED Course User Index  [JM] Domingo Cocking, MD           CONSULTS:  None    PROCEDURES:  Unless otherwise noted below, none     Procedures      FINAL IMPRESSION      1. Abdominal pain, epigastric    2. Nausea and vomiting, unspecified vomiting type    3. AKI (acute kidney injury) [facility])          DISPOSITION/PLAN   DISPOSITION Decision To Admit 02/01/2023 07:10:22 PM    Perfect Serve Consult for Admission  7:11 PM    ED Room Number: ER06/06  Patient Name and age:  Melinda [name] 42 y.o.  female  Working  Diagnosis:   1. Abdominal pain, epigastric    2. Nausea and vomiting, unspecified vomiting type    3. AKI (acute kidney injury) (HCC)  COVID-19 Suspicion: No  Sepsis present:  No  Reassessment needed: No  Code Status:  Full Code  Readmission: Yes  Isolation Requirements: no  Recommended Level of Care: telemetry  Department: [facility] - [phone]  Consulting Provider:     Other:  Epigastric pain and vomiting.  Tachycardic and hypertensive.  Had similar symptoms during previous admission and had EGD and CTA chest which were unremarkable.  She now has intractable vomiting and pain.  Cr increased to 1.9.  Labs otherwise unremarkable.       Total critical care time spent exclusive of procedures:  35 minutes.     PATIENT REFERRED TO:  No follow-up provider specified.    DISCHARGE MEDICATIONS:  New Prescriptions    No medications on file         (Please note that portions of this note were completed with a voice recognition program.  Efforts were made to edit the dictations but occasionally words are mis-transcribed.)    Lesia Sago, MD (electronically signed)  Emergency Attending Physician / Physician Assistant / Nurse Practitioner             Domingo Cocking, MD  02/01/23 2001

## 2023-02-01 NOTE — Consults (Signed)
Session ID: [id number]  Language: Spanish  Interpreter ID: #[id number]  Interpreter Name: Jeralyn Bennett

## 2023-02-01 NOTE — ED Notes (Signed)
Assumed care of patient at this time

## 2023-02-01 NOTE — ED Notes (Signed)
Report given to Jordan and Kaylah RN

## 2023-02-01 NOTE — ED Notes (Signed)
Administered additional nausea medications (compazine) due to pt still throwing up after zofran was given prior

## 2023-02-01 NOTE — ED Notes (Signed)
Administered PRN bentyl for pain as ordered by provider. Pt is now being transported upstairs. 4th floor RN notified about delay in transport

## 2023-02-01 NOTE — ED Notes (Signed)
Patient very difficult stick. Both sets of blood cultures collected with ultrasound guidance.

## 2023-02-01 NOTE — H&P (Signed)
Hospitalist Admission Note      NAME:  Melinda [name]   DOB:  1981/06/10   MRN:  [id number]     Date/Time:  02/01/2023 8:27 PM    Patient PCP: No primary care provider on file.    ________________________________________________________________________    Given the patient's current clinical presentation, I have a high level of concern for decompensation if discharged from the emergency department.  Complex decision making was performed, which includes reviewing the patient's available past medical records, laboratory results, and x-ray films.       My assessment of this patient's clinical condition and my plan of care is as follows.    Assessment / Plan:  Patient is a 42 year old female with history of hypertension, diabetes, hyperlipidemia, anemia comes to the hospital with chief complaint of recurrent abdominal pain.  Patient is also found to have AKI.  Patient is admitted for further evaluation management.    1.  Abdominal pain  Etiology not clear.  Patient had an EGD done on 01/18/2023 which did not show any acute findings.  Patient had a colonoscopy done on 01/19/2023 which was again negative.  Patient will be given IV Protonix and pain control.  CT abdomen and pelvis pending.  May need a GI consult in the morning.    2.  AKI  Hold lisinopril and spironolactone.  Start IV fluids and avoid nephrotoxic medications.    3.  Uncontrolled hypertension  Continue Toprol-XL and start IV hydralazine as needed.    4.  Diabetes  Patient takes insulin NPH 15 units twice daily.    5.  Hyperlipidemia  Most recent LDL was normal.  Patient is not on statins.        I have personally reviewed the radiographs, laboratory data in Epic and decisions and statements above are based partially on this personal interpretation.    Code Status: Full Code  DVT Prophylaxis: Lovenox  GI Prophylaxis: PPI    Plan of care discussed with the patient and her daughters at bedside.     Subjective:   CHIEF COMPLAINT: "Abdominal pain"    HISTORY  OF PRESENT ILLNESS:     Melinda [name] is a very pleasant 42 year old female with a history of hypertension, hyperlipidemia and diabetes comes to the hospital with chief complaint of abdominal pain.  As per the patient abdominal pain is going on for almost 2 days.  The pain is located in the epigastric area and is radiating to her back.  It is associated with nausea and vomiting.  She denies any fever.  She denies any diarrhea.  As per the patient she was recently admitted to this hospital with similar symptoms and she was found to be septic at that time due to UTI.  Reviewing the discharge summary it looks like patient was growing Staph epidermidis in the urine and blood cultures remained negative.  Urine culture was probably contaminated.  Patient denies any any chest pain or shortness of breath.  When she arrived to the hospital her blood pressure was 180/102, pulse 106, respiratory rate 22, temperature 98.6.  Blood work showed sodium 138, potassium 4.2, creatinine 1.19, glucose 246  WBC 8.9, hemoglobin 10.4, platelet count 562.  UA does not look infected.  Chest x-ray is negative for any acute findings.  CT scan of abdomen and pelvis is done but result is pending.  Available records were reviewed at the time of H&P.        Past Medical History:   Diagnosis Date  High blood pressure     High cholesterol     Type 2 diabetes mellitus (HCC)     Controled      Past Surgical History:   Procedure Laterality Date    CAPSULE ENDOSCOPY N/A 01/20/2023    ESOPHAGEAL CAPSULE ENDOSCOPY remove at 1624PM performed by Glyn Ade, MD at [facility] ENDOSCOPY    COLONOSCOPY N/A 01/19/2023    COLONOSCOPY DIAGNOSTIC performed by Glyn Ade, MD at [facility] ENDOSCOPY    OTHER SURGICAL HISTORY Left     Rentia attachment    UPPER GASTROINTESTINAL ENDOSCOPY N/A 01/17/2023    ESOPHAGOGASTRODUODENOSCOPY performed by Glyn Ade, MD at [facility] ENDOSCOPY    UPPER GASTROINTESTINAL ENDOSCOPY N/A 01/17/2023    ESOPHAGOGASTRODUODENOSCOPY BIOPSY performed by Glyn Ade,  MD at [facility] ENDOSCOPY    UPPER GASTROINTESTINAL ENDOSCOPY N/A 01/18/2023    ESOPHAGOGASTRODUODENOSCOPY performed by Glyn Ade, MD at [facility] ENDOSCOPY     Social History     Tobacco Use    Smoking status: Never    Smokeless tobacco: Never   Substance Use Topics    Alcohol use: Never      No family history on file.     No Known Allergies     Prior to Admission medications    Medication Sig Start Date End Date Taking? Authorizing Provider   naloxone [facility]) 4 MG/0.1ML LIQD nasal spray 1 spray by Nasal route as needed for Opioid Reversal 01/20/23   Horald Chestnut, DO   metoprolol succinate (TOPROL XL) 50 MG extended release tablet Take 1 tablet by mouth daily 01/21/23   Horald Chestnut, DO   ondansetron (ZOFRAN-ODT) 4 MG disintegrating tablet Take 1 tablet by mouth every 8 hours as needed for Nausea or Vomiting 01/20/23   Horald Chestnut, DO   polyethylene glycol (GLYCOLAX) 17 g packet Take 1 packet by mouth daily as needed for Constipation 01/20/23 02/19/23  Horald Chestnut, DO   ferrous sulfate (IRON 325) 325 (65 Fe) MG tablet Take 1 tablet by mouth every morning (before breakfast) 01/20/23   Horald Chestnut, DO   spironolactone (ALDACTONE) 25 MG tablet Take 1 tablet by mouth daily    [provider]   lisinopril (PRINIVIL;ZESTRIL) 40 MG tablet Take 1 tablet by mouth daily    [provider]   amLODIPine (NORVASC) 10 MG tablet Take 1 tablet by mouth daily    [provider]   insulin 70-30 (NOVOLIN 70/30) (70-30) 100 UNIT per ML injection vial Inject 15 Units into the skin 2 times daily 07/10/21   Automatic Reconciliation, Ar       REVIEW OF SYSTEMS:  See HPI for details  General:  negative for fever, chills, sweats, weakness, weight loss  Eyes: negative for blurred vision, eye pain, loss of vision, diplopia  Ear Nose and Throat: negative for rhinorrhea, pharyngitis, otalgia, tinnitus, speech or swallowing difficulties  Respiratory:  negative for pleuritic pain, cough, sputum production, wheezing, SOB, DOE  Cardiology:   negative for chest pain, palpitations, orthopnea, PND, edema, syncope   Gastrointestinal: Positive for abdominal pain, positive for nausea and vomiting.  Genitourinary: negative for frequency, urgency, dysuria, hematuria, incontinence  Muskuloskeletal : negative for arthralgia, myalgia  Hematology: negative for easy bruising, bleeding, lymphadenopathy  Dermatological: negative for rash, ulceration, mole change, new lesion  Endocrine: negative for hot flashes or polydipsia  Neurological: negative for headache, dizziness, confusion, focal weakness, paresthesia, memory loss, gait disturbance  Psychological: negative for anxiety, depression, agitation      Objective:   VITALS:  Vitals:    02/01/23 1945   BP: (!) 171/90   Pulse: 93   Resp:    Temp:    SpO2:      PHYSICAL EXAM:    Physical Exam:    Gen: Well-developed, well-nourished, in no acute distress  HEENT:  Pink conjunctivae, PERRL, hearing intact to voice, moist mucous membranes  Neck: Supple, without masses, thyroid non-tender  Resp: No accessory muscle use, clear breath sounds without wheezes rales or rhonchi  Card: No murmurs, normal S1, S2 without thrills, bruits or peripheral edema  Abd:  Soft, tender in epigastric area, non-distended, normoactive bowel sounds are present, no palpable organomegaly and no detectable hernias  Lymph:  No cervical or inguinal adenopathy  Musc: No cyanosis or clubbing  Skin: No rashes or ulcers, skin turgor is good  Neuro:  Cranial nerves are grossly intact, no focal motor weakness, follows commands appropriately  Psych:  Good insight, oriented to person, place and time, alert          _______________________________________________________________________  Care Plan discussed with:    Comments   Patient x Discussed with patient in room. POC outlined and Questions answered    Family      RN x    Care Manager                    Consultant:  x ED MD    _______________________________________________________________________  Recommended Disposition:   Home with Family x   HH/PT/OT/RN    SNF/LTC    [facility]    ________________________________________________________________________  TOTAL TIME:  60 Minutes        Comments   >50% of visit spent in counseling and coordination of care  Chart reviewed  Discussion with patient and/or family and questions answered     ________________________________________________________________________  Signed: Nanda Quinton, MD        Procedures: see electronic medical records for all procedures/Xrays/labs and details which were not copied into this note but were reviewed prior to creation of Plan.

## 2023-02-01 NOTE — ED Notes (Signed)
Pt off to ct for imaging

## 2023-02-01 NOTE — ED Notes (Signed)
Pt stated that her nausea has improved since she received the compazine

## 2023-02-01 NOTE — ED Notes (Signed)
TRANSFER - OUT REPORT:    Verbal report given to [facility] RN on [employer]  being transferred to 4th floor for routine progression of patient care       Report consisted of patient's Situation, Background, Assessment and   Recommendations(SBAR).     Information from the following report(s) Nurse Handoff Report, ED Encounter Summary, and ED SBAR was reviewed with the receiving nurse.    Kinder Fall Assessment:    Presents to emergency department  because of falls (Syncope, seizure, or loss of consciousness): No  Age > 70: No  Altered Mental Status, Intoxication with alcohol or substance confusion (Disorientation, impaired judgment, poor safety awaremess, or inability to follow instructions): No  Impaired Mobility: Ambulates or transfers with assistive devices or assistance; Unable to ambulate or transer.: No  Nursing Judgement: Yes          Lines:   Peripheral IV 02/01/23 Distal;Anterior;Left Cephalic (Active)        Opportunity for questions and clarification was provided.      Patient transported with:  [employer]

## 2023-02-02 ENCOUNTER — Inpatient Hospital Stay: Admit: 2023-02-02 | Payer: MEDICAID | Primary: Adult Health

## 2023-02-02 LAB — CBC WITH AUTO DIFFERENTIAL
Basophils %: 1 % (ref 0–1)
Basophils Absolute: 0.1 10*3/uL (ref 0.0–0.1)
Eosinophils %: 2 % (ref 0–7)
Eosinophils Absolute: 0.2 10*3/uL (ref 0.0–0.4)
Hematocrit: 28 % — ABNORMAL LOW (ref 35.0–47.0)
Hemoglobin: 9.2 g/dL — ABNORMAL LOW (ref 11.5–16.0)
Immature Granulocytes %: 0 % (ref 0.0–0.5)
Immature Granulocytes Absolute: 0 10*3/uL (ref 0.00–0.04)
Lymphocytes %: 19 % (ref 12–49)
Lymphocytes Absolute: 1.7 10*3/uL (ref 0.8–3.5)
MCH: 25.4 PG — ABNORMAL LOW (ref 26.0–34.0)
MCHC: 32.9 g/dL (ref 30.0–36.5)
MCV: 77.3 FL — ABNORMAL LOW (ref 80.0–99.0)
MPV: 8.7 FL — ABNORMAL LOW (ref 8.9–12.9)
Monocytes %: 8 % (ref 5–13)
Monocytes Absolute: 0.7 10*3/uL (ref 0.0–1.0)
Neutrophils %: 70 % (ref 32–75)
Neutrophils Absolute: 6.1 10*3/uL (ref 1.8–8.0)
Nucleated RBCs: 0 PER 100 WBC
Platelets: 447 10*3/uL — ABNORMAL HIGH (ref 150–400)
RBC: 3.62 M/uL — ABNORMAL LOW (ref 3.80–5.20)
RDW: 20.2 % — ABNORMAL HIGH (ref 11.5–14.5)
WBC: 8.8 10*3/uL (ref 3.6–11.0)
nRBC: 0 10*3/uL (ref 0.00–0.01)

## 2023-02-02 LAB — BASIC METABOLIC PANEL W/ REFLEX TO MG FOR LOW K
Anion Gap: 7 mmol/L (ref 5–15)
BUN/Creatinine Ratio: 13 (ref 12–20)
BUN: 19 MG/DL (ref 6–20)
CO2: 21 mmol/L (ref 21–32)
Calcium: 8.1 MG/DL — ABNORMAL LOW (ref 8.5–10.1)
Chloride: 111 mmol/L — ABNORMAL HIGH (ref 97–108)
Creatinine: 1.44 MG/DL — ABNORMAL HIGH (ref 0.55–1.02)
Est, Glom Filt Rate: 47 mL/min/{1.73_m2} — ABNORMAL LOW (ref >60)
Glucose: 199 mg/dL — ABNORMAL HIGH (ref 65–100)
Potassium: 3.7 mmol/L (ref 3.5–5.1)
Sodium: 139 mmol/L (ref 136–145)

## 2023-02-02 LAB — HEMOGLOBIN A1C
Estimated Avg Glucose: 206 mg/dL
Hemoglobin A1C: 8.8 % — ABNORMAL HIGH (ref 4.0–5.6)

## 2023-02-02 LAB — POCT GLUCOSE
POC Glucose: 213 mg/dL — ABNORMAL HIGH (ref 65–117)
POC Glucose: 217 mg/dL — ABNORMAL HIGH (ref 65–117)
POC Glucose: 200 mg/dL — ABNORMAL HIGH (ref 65–117)
POC Glucose: 217 mg/dL — ABNORMAL HIGH (ref 65–117)

## 2023-02-02 MED ORDER — MAGNESIUM SULFATE 2000 MG/50 ML IVPB PREMIX
2 | INTRAVENOUS | Status: DC | PRN
Start: 2023-02-02 — End: 2023-02-04

## 2023-02-02 MED ORDER — METOPROLOL SUCCINATE ER 50 MG PO TB24
50 MG | Freq: Every day | ORAL | Status: DC
Start: 2023-02-02 — End: 2023-02-03
  Administered 2023-02-02 – 2023-02-03 (×2): 50 mg via ORAL

## 2023-02-02 MED ORDER — INSULIN LISPRO 100 UNIT/ML IJ SOLN
100 UNIT/ML | Freq: Three times a day (TID) | INTRAMUSCULAR | Status: DC
Start: 2023-02-02 — End: 2023-02-04
  Administered 2023-02-04: 13:00:00 2 [IU] via SUBCUTANEOUS

## 2023-02-02 MED ORDER — POTASSIUM BICARB-CITRIC ACID 20 MEQ PO TBEF
20 MEQ | ORAL | Status: DC | PRN
Start: 2023-02-02 — End: 2023-02-04

## 2023-02-02 MED ORDER — SODIUM CHLORIDE 0.9 % IV SOLN
0.9 | INTRAVENOUS | Status: AC
Start: 2023-02-02 — End: 2023-02-03
  Administered 2023-02-02 – 2023-02-03 (×5): via INTRAVENOUS

## 2023-02-02 MED ORDER — LORAZEPAM 0.5 MG PO TABS
0.5 MG | ORAL | Status: DC | PRN
Start: 2023-02-02 — End: 2023-02-04
  Administered 2023-02-02 – 2023-02-04 (×4): 0.5 mg via ORAL

## 2023-02-02 MED ORDER — FERROUS SULFATE 325 (65 FE) MG PO TABS
325 | Freq: Every day | ORAL | Status: DC
Start: 2023-02-02 — End: 2023-02-04
  Administered 2023-02-02 – 2023-02-04 (×2): 325 mg via ORAL

## 2023-02-02 MED ORDER — HYDRALAZINE HCL 20 MG/ML IJ SOLN
20 MG/ML | Freq: Four times a day (QID) | INTRAMUSCULAR | Status: DC | PRN
Start: 2023-02-02 — End: 2023-02-04
  Administered 2023-02-02 – 2023-02-03 (×2): 10 mg via INTRAVENOUS

## 2023-02-02 MED ORDER — ONDANSETRON 4 MG PO TBDP
4 MG | Freq: Three times a day (TID) | ORAL | Status: DC | PRN
Start: 2023-02-02 — End: 2023-02-04

## 2023-02-02 MED ORDER — DEXTROSE 10 % IV BOLUS
INTRAVENOUS | Status: DC | PRN
Start: 2023-02-02 — End: 2023-02-04

## 2023-02-02 MED ORDER — PROCHLORPERAZINE EDISYLATE 10 MG/2ML IJ SOLN
10 MG/2ML | Freq: Four times a day (QID) | INTRAMUSCULAR | Status: DC | PRN
Start: 2023-02-02 — End: 2023-02-04
  Administered 2023-02-02 – 2023-02-03 (×3): 10 mg via INTRAVENOUS

## 2023-02-02 MED ORDER — HYDROMORPHONE 0.5MG/0.5ML IJ SOLN
1 | Freq: Once | Status: AC
Start: 2023-02-02 — End: 2023-02-02
  Administered 2023-02-02: 07:00:00 0.25 mg via INTRAVENOUS

## 2023-02-02 MED ORDER — ACETAMINOPHEN 650 MG RE SUPP
650 MG | Freq: Four times a day (QID) | RECTAL | Status: DC | PRN
Start: 2023-02-02 — End: 2023-02-03

## 2023-02-02 MED ORDER — SODIUM CHLORIDE 0.9 % IV SOLN
0.9 % | INTRAVENOUS | Status: DC | PRN
Start: 2023-02-02 — End: 2023-02-04

## 2023-02-02 MED ORDER — INSULIN NPH ISOPHANE & REGULAR (70-30) 100 UNIT/ML SC SUPN
Freq: Two times a day (BID) | SUBCUTANEOUS | Status: DC
Start: 2023-02-02 — End: 2023-02-01

## 2023-02-02 MED ORDER — NORMAL SALINE FLUSH 0.9 % IV SOLN
0.9 | Freq: Two times a day (BID) | INTRAVENOUS | Status: DC
Start: 2023-02-02 — End: 2023-02-04
  Administered 2023-02-02: 03:00:00 10 mL via INTRAVENOUS
  Administered 2023-02-02: 16:00:00 5 mL via INTRAVENOUS
  Administered 2023-02-03: 02:00:00 10 mL via INTRAVENOUS
  Administered 2023-02-03: 13:00:00 5 mL via INTRAVENOUS
  Administered 2023-02-04 (×2): 10 mL via INTRAVENOUS

## 2023-02-02 MED ORDER — POTASSIUM CHLORIDE 10 MEQ/100ML IV SOLN
10100 MEQ/0ML | INTRAVENOUS | Status: DC | PRN
Start: 2023-02-02 — End: 2023-02-04

## 2023-02-02 MED ORDER — TECHNETIUM TC 99M MEBROFENIN IV KIT
Freq: Once | INTRAVENOUS | Status: AC
Start: 2023-02-02 — End: 2023-02-02
  Administered 2023-02-02: 13:00:00 6 via INTRAVENOUS

## 2023-02-02 MED ORDER — NORMAL SALINE FLUSH 0.9 % IV SOLN
0.9 % | INTRAVENOUS | Status: DC | PRN
Start: 2023-02-02 — End: 2023-02-04

## 2023-02-02 MED ORDER — INSULIN NPH (HUMAN) (ISOPHANE) 100 UNIT/ML SC SUSP
100 UNIT/ML | Freq: Two times a day (BID) | SUBCUTANEOUS | Status: DC
Start: 2023-02-02 — End: 2023-02-04
  Administered 2023-02-04: 13:00:00 7 [IU] via SUBCUTANEOUS

## 2023-02-02 MED ORDER — DEXTROSE 10 % IV SOLN
10 % | INTRAVENOUS | Status: DC | PRN
Start: 2023-02-02 — End: 2023-02-04

## 2023-02-02 MED ORDER — GLUCAGON (RDNA) 1 MG IJ KIT
1 | INTRAMUSCULAR | Status: DC | PRN
Start: 2023-02-02 — End: 2023-02-04

## 2023-02-02 MED ORDER — METOCLOPRAMIDE HCL 5 MG/ML IJ SOLN
5 MG/ML | Freq: Four times a day (QID) | INTRAMUSCULAR | Status: DC
Start: 2023-02-02 — End: 2023-02-03
  Administered 2023-02-02 – 2023-02-03 (×4): 5 mg via INTRAVENOUS

## 2023-02-02 MED ORDER — ENOXAPARIN SODIUM 40 MG/0.4ML IJ SOSY
40 | Freq: Every evening | INTRAMUSCULAR | Status: DC
Start: 2023-02-02 — End: 2023-02-04
  Administered 2023-02-02 – 2023-02-03 (×2): 40 mg via SUBCUTANEOUS

## 2023-02-02 MED ORDER — POTASSIUM CHLORIDE ER 10 MEQ PO TBCR
10 MEQ | ORAL | Status: DC | PRN
Start: 2023-02-02 — End: 2023-02-04

## 2023-02-02 MED ORDER — ACETAMINOPHEN 325 MG PO TABS
325 MG | Freq: Four times a day (QID) | ORAL | Status: DC | PRN
Start: 2023-02-02 — End: 2023-02-03

## 2023-02-02 MED ORDER — AMLODIPINE BESYLATE 5 MG PO TABS
5 MG | Freq: Every day | ORAL | Status: DC
Start: 2023-02-02 — End: 2023-02-04
  Administered 2023-02-02 – 2023-02-04 (×2): 10 mg via ORAL

## 2023-02-02 MED ORDER — ONDANSETRON HCL 4 MG/2ML IJ SOLN
4 | Freq: Four times a day (QID) | INTRAMUSCULAR | Status: DC | PRN
Start: 2023-02-02 — End: 2023-02-04
  Administered 2023-02-02 – 2023-02-03 (×5): 4 mg via INTRAVENOUS

## 2023-02-02 MED ORDER — DICYCLOMINE HCL 20 MG PO TABS
20 | Freq: Four times a day (QID) | ORAL | Status: DC | PRN
Start: 2023-02-02 — End: 2023-02-04
  Administered 2023-02-02: 02:00:00 20 mg via ORAL

## 2023-02-02 MED ORDER — SODIUM CHLORIDE (PF) 0.9 % IJ SOLN
0.9 % | Freq: Every day | INTRAMUSCULAR | Status: DC
Start: 2023-02-02 — End: 2023-02-04
  Administered 2023-02-02 – 2023-02-04 (×4): 40 mg via INTRAVENOUS

## 2023-02-02 MED ORDER — GLUCOSE 4 G PO CHEW
4 | ORAL | Status: DC | PRN
Start: 2023-02-02 — End: 2023-02-04

## 2023-02-02 MED ORDER — INSULIN LISPRO 100 UNIT/ML IJ SOLN
100 UNIT/ML | Freq: Every evening | INTRAMUSCULAR | Status: DC
Start: 2023-02-02 — End: 2023-02-04

## 2023-02-02 MED ORDER — MORPHINE SULFATE (PF) 4 MG/ML IJ SOLN
4 | INTRAMUSCULAR | Status: DC | PRN
Start: 2023-02-02 — End: 2023-02-03
  Administered 2023-02-02 – 2023-02-03 (×8): 4 mg via INTRAVENOUS

## 2023-02-02 MED ORDER — METOCLOPRAMIDE HCL 5 MG/ML IJ SOLN
5 | Freq: Four times a day (QID) | INTRAMUSCULAR | Status: DC
Start: 2023-02-02 — End: 2023-02-02

## 2023-02-02 MED ORDER — POLYETHYLENE GLYCOL 3350 17 G PO PACK
17 | Freq: Every day | ORAL | Status: DC | PRN
Start: 2023-02-02 — End: 2023-02-04

## 2023-02-02 MED FILL — PANTOPRAZOLE SODIUM 40 MG IV SOLR: 40 MG | INTRAVENOUS | Qty: 40

## 2023-02-02 MED FILL — FERROUS SULFATE 325 (65 FE) MG PO TABS: 325 (65 Fe) MG | ORAL | Qty: 1

## 2023-02-02 MED FILL — PROCHLORPERAZINE EDISYLATE 10 MG/2ML IJ SOLN: 10 MG/2ML | INTRAMUSCULAR | Qty: 2

## 2023-02-02 MED FILL — AMLODIPINE BESYLATE 5 MG PO TABS: 5 MG | ORAL | Qty: 2

## 2023-02-02 MED FILL — HYDROMORPHONE HCL 1 MG/ML IJ SOLN: 1 MG/ML | INTRAMUSCULAR | Qty: 0.5

## 2023-02-02 MED FILL — HUMULIN N 100 UNIT/ML SC SUSP: 100 UNIT/ML | SUBCUTANEOUS | Qty: 7

## 2023-02-02 MED FILL — MORPHINE SULFATE 4 MG/ML IJ SOLN: 4 mg/mL | INTRAMUSCULAR | Qty: 1

## 2023-02-02 MED FILL — HYDRALAZINE HCL 20 MG/ML IJ SOLN: 20 MG/ML | INTRAMUSCULAR | Qty: 1

## 2023-02-02 MED FILL — DICYCLOMINE HCL 20 MG PO TABS: 20 MG | ORAL | Qty: 1

## 2023-02-02 MED FILL — ENOXAPARIN SODIUM 40 MG/0.4ML IJ SOSY: 40 MG/0.4ML | INTRAMUSCULAR | Qty: 0.4

## 2023-02-02 MED FILL — ONDANSETRON HCL 4 MG/2ML IJ SOLN: 4 MG/2ML | INTRAMUSCULAR | Qty: 2

## 2023-02-02 MED FILL — SODIUM CHLORIDE 0.9 % IV SOLN: 0.9 % | INTRAVENOUS | Qty: 1000

## 2023-02-02 MED FILL — DEXTROSE 10 % IV SOLN: 10 % | INTRAVENOUS | Qty: 500

## 2023-02-02 MED FILL — LORAZEPAM 0.5 MG PO TABS: 0.5 MG | ORAL | Qty: 1

## 2023-02-02 MED FILL — INSULIN LISPRO 100 UNIT/ML IJ SOLN: 100 UNIT/ML | INTRAMUSCULAR | Qty: 1

## 2023-02-02 MED FILL — METOPROLOL SUCCINATE ER 50 MG PO TB24: 50 MG | ORAL | Qty: 1

## 2023-02-02 MED FILL — BD POSIFLUSH 0.9 % IV SOLN: 0.9 % | INTRAVENOUS | Qty: 40

## 2023-02-02 MED FILL — METOCLOPRAMIDE HCL 5 MG/ML IJ SOLN: 5 MG/ML | INTRAMUSCULAR | Qty: 2

## 2023-02-02 NOTE — Progress Notes (Signed)
[facility]  [address] [place] [place], [place] [address]  [phone]    [facility]                                                                                          Hospitalist Progress Note  Pryor Ochoa, MD        Date of Service:  02/02/2023  NAME:  Melinda [name]  DOB:  10-27-1980  MRN:  [id number]      Admission Summary:   42 year old female was admitted for evaluation of abdominal pain    Interval history / Subjective:   Continued abdominal pain as well as nausea requiring IV medications     Assessment & Plan:     Abdominal pain  -At this point etiology unclear  -EGD on 4/9 and colonoscopy 9/10 did not show any acute findings  -No findings on CT scan  -Continue IV Protonix  -Check HIDA scan  -Consider GI consult if HIDA scan is unremarkable    AKI  -Suspect IVVD  -Improving with IV fluids  -Holding lisinopril and spironolactone    Hypertension  -Continue metoprolol and IV hydralazine as needed  -Holding lisinopril and spironolactone due to AKI    Type 2 diabetes  -A1c 8.8  -Continue Lantus and SSI per protocol    Outisde Records, prior notes, labs, radiology, and medications reviewed     Code status: Full code  DVT prophylaxis: Lovenox       Hospital Problems             Last Modified POA    * (Principal) Abdominal pain 02/01/2023 Yes          Review of Systems:   Pertinent items are noted in HPI.       Vital Signs:    Last 24hrs VS reviewed since prior progress note. Most recent are:  Vitals:    02/02/23 1123   BP: (!) 155/90   Pulse: 100   Resp: 16   Temp: 97.9 F (36.6 C)   SpO2: 96%       No intake or output data in the 24 hours ending 02/02/23 1124     Physical Examination:             Constitutional:  No acute distress, cooperative, pleasant    ENT:  Oral mucosa moist, oropharynx benign.    Resp:  CTA bilaterally. No wheezing/rhonchi/rales. No accessory muscle use   CV:  Regular rhythm, normal rate, no murmurs, gallops,  rubs    GI:  Soft, non distended, non tender. normoactive bowel sounds, no hepatosplenomegaly     Musculoskeletal:  No edema, warm, 2+ pulses throughout    Neurologic:  Moves all extremities.  AAOx3, CN II-XII reviewed     Psych:  Good insight, Not anxious nor agitated.       Data Review:    Review and/or order of clinical lab test      Labs:     Recent Labs     02/01/23  1734 02/02/23  0513   WBC 8.9  8.8   HGB 10.4* 9.2*   HCT 31.9* 28.0*   PLT 562* 447*     Recent Labs     02/01/23  1734 02/02/23  0513   NA 138 139   K 4.2 3.7   CL 108 111*   CO2 22 21   BUN 24* 19   MG 2.2  --      Lab Results   Component Value Date/Time    ALT 25 02/01/2023 05:34 PM    ALT 11 01/15/2023 04:47 AM    ALT 22 01/13/2023 09:11 PM    GLOB 4.6 02/01/2023 05:34 PM    GLOB 3.3 01/15/2023 04:47 AM    GLOB 4.2 01/13/2023 09:11 PM     No results found for: "INR", "APTT"   Lab Results   Component Value Date/Time    IRON 14 01/14/2023 01:31 PM    TIBC 274 01/14/2023 01:31 PM      No results found for: "FOL", "RBCF"   No results for input(s): "PH", "PCO2", "PO2" in the last 72 hours.  No results found for: "CPK"  Lab Results   Component Value Date/Time    CHOL 341 09/16/2022 10:30 AM    HDL 33 09/16/2022 10:30 AM     No results found for: "GLUCPOC"  No results found for: "UA"      Medications Reviewed:     Current Facility-Administered Medications   Medication Dose Route Frequency    prochlorperazine (COMPAZINE) injection 10 mg  10 mg IntraVENous Q6H PRN    amLODIPine (NORVASC) tablet 10 mg  10 mg Oral Daily    ferrous sulfate (IRON 325) tablet 325 mg  325 mg Oral QAM AC    metoprolol succinate (TOPROL XL) extended release tablet 50 mg  50 mg Oral Daily    glucose chewable tablet 16 g  4 tablet Oral PRN    dextrose bolus 10% 125 mL  125 mL IntraVENous PRN    Or    dextrose bolus 10% 250 mL  250 mL IntraVENous PRN    glucagon injection 1 mg  1 mg SubCUTAneous PRN    dextrose 10 % infusion   IntraVENous Continuous PRN    sodium chloride flush  0.9 % injection 5-40 mL  5-40 mL IntraVENous 2 times per day    sodium chloride flush 0.9 % injection 5-40 mL  5-40 mL IntraVENous PRN    0.9 % sodium chloride infusion   IntraVENous PRN    potassium chloride (KLOR-CON) extended release tablet 40 mEq  40 mEq Oral PRN    Or    potassium bicarb-citric acid (EFFER-K) effervescent tablet 40 mEq  40 mEq Oral PRN    Or    potassium chloride 10 mEq/100 mL IVPB (Peripheral Line)  10 mEq IntraVENous PRN    magnesium sulfate 2000 mg in 50 mL IVPB premix  2,000 mg IntraVENous PRN    enoxaparin (LOVENOX) injection 40 mg  40 mg SubCUTAneous QPM    ondansetron (ZOFRAN-ODT) disintegrating tablet 4 mg  4 mg Oral Q8H PRN    Or    ondansetron (ZOFRAN) injection 4 mg  4 mg IntraVENous Q6H PRN    polyethylene glycol (GLYCOLAX) packet 17 g  17 g Oral Daily PRN    acetaminophen (TYLENOL) tablet 650 mg  650 mg Oral Q6H PRN    Or    acetaminophen (TYLENOL) suppository 650 mg  650 mg Rectal Q6H PRN    insulin lispro (HUMALOG) injection vial 0-4 Units  0-4 Units SubCUTAneous TID WC    insulin lispro (HUMALOG) injection vial 0-4 Units  0-4 Units SubCUTAneous Nightly    0.9 % sodium chloride infusion   IntraVENous Continuous    morphine sulfate (PF) injection 4 mg  4 mg IntraVENous Q4H PRN    hydrALAZINE (APRESOLINE) injection 10 mg  10 mg IntraVENous Q6H PRN    pantoprazole (PROTONIX) 40 mg in sodium chloride (PF) 0.9 % 10 mL injection  40 mg IntraVENous Daily    LORazepam (ATIVAN) tablet 0.5 mg  0.5 mg Oral Q4H PRN    insulin NPH (HumuLIN N;NovoLIN N) injection vial 7 Units  7 Units SubCUTAneous BID AC    dicyclomine (BENTYL) tablet 20 mg  20 mg Oral Q6H PRN     ______________________________________________________________________  EXPECTED LENGTH OF STAY:    ACTUAL LENGTH OF STAY:          1                 Pryor Ochoa, MD

## 2023-02-02 NOTE — Progress Notes (Signed)
Consult received and chart reviewed  Full consult to follow in am or next time I am in hospital  Will tentatively plan for OR tomorrow as OR schedule allows  NPO at midnight

## 2023-02-02 NOTE — Progress Notes (Signed)
At approx 0840 patient went to nuclear med.  NPO for this test. Meds held.

## 2023-02-02 NOTE — Progress Notes (Signed)
Pt complain of pain to abdomen 10/10. Too soon to give morphine. Np on call notified. Receive new order for one time dose of dilaudid. Will continue to monitor.

## 2023-02-02 NOTE — Plan of Care (Signed)
Problem: Pain  Goal: Verbalizes/displays adequate comfort level or baseline comfort level  02/02/2023 1332 by Dorothyann Gibbs., RN  Outcome: Progressing

## 2023-02-02 NOTE — Progress Notes (Signed)
Pt complain of nausea. To soon to give Zofran. Np notified. Receive new order for compazine. Will continue to monitor.

## 2023-02-02 NOTE — Care Coordination-Inpatient (Signed)
Care Management Initial Assessment  02/02/2023 3:51 PM  If patient is discharged prior to next notation, then this note serves as note for discharge by case management.    Reason for Admission:   Abdominal pain, epigastric [R10.13]  Abdominal pain [R10.9]  AKI (acute kidney injury) (HCC) [N17.9]  Nausea and vomiting, unspecified vomiting type [R11.2]         Patient Admission Status: Inpatient  RUR: Readmission Risk Score: 11.3    Hospitalization in the last 30 days (Readmission):  Yes      Advance Directive: Full Code  Primary Healthcare Decision Maker: (P) Legal Next of Kin (Patient requested to contact her 41yo dtr:  Melinda [name] [phone])      No AD on file.     AD on file.     Current AD not on file. Copy requested.     Requests AD, and referral submitted to Spiritual Care.   __________________________________________________________________________  Assessment:     Comments:     Discharge Concerns: Yes No Unknown   Describe:    Financial concerns/barriers: Yes, explain: No Unknown/Not discussed.  Patient is uninsured.  Patient is undocumented.    __________________________________________________________________________    Insurer:   Active Insurance as of 02/01/2023       Patient has no active insurance coverage on file for 02/01/2023.            PCP: [facility]    Pharmacy:   [facility] [address] [place] - [address] [place] [phone] - F [phone]  [address]  [place] [address]  Phone: [phone] Fax: [phone]    DC Transport: (P) Family.  Patient's daughter, Melinda [name] is assisting.         Transition of care plan:    Unable to determine at this time. Awaiting clinical progress, and disposition recommendations.     Home. Medication assistance needed.  Kindly fill her medications in the hospital prior to DC.  Patient stated that she cannot afford to pay for her medications.       Home. Pt refused recommended  services.     Home with family assistance as needed, and outpatient follow-up.     Home with Outpatient PT and outpatient follow-up   Pt aware of OP appt? Yes, Provider:   Not scheduled   Transport provider:      Home with outpatient services.    Specify:     Home with Home Health   - Freedom of Choice offered?  Yes, Preference:    NA    SNF/IPR   - Freedom of Choice offered, and preferences given:   Listing provided and preferences requested   -Status: Pending Accepted:    -Auth required: Yes No    -Auth initiated date:   -3 midnight stay required: Yes No  Date satisfied:      LTC:      Home with Hospice   - Freedom of Choice offered?  Yes, Preference:    NA     [facility] information provided.      Other:      02/02/23 1542   Service Assessment   Patient Orientation Alert and Oriented   History Provided By Patient   Primary Caregiver Self   Support Systems Children  Melinda [name] [phone])   Patient's Healthcare Decision Maker is: Legal Next of Kin  (Patient requested to contact her 41yo dtr:  Melinda [name] [phone])   PCP Verified by CM  Yes  [facility])   Last Visit to PCP Within last 3 months   Prior Functional Level Independent in ADLs/IADLs   Current Functional Level Independent in ADLs/IADLs   Can patient return to prior living arrangement Yes   Ability to make needs known: Good   Family able to assist with home care needs: Yes   Would you like for me to discuss the discharge plan with any other family members/significant others, and if so, who? Yes  Melinda [name] [phone])   Financial Resources None  (Uninsured.  Patient is undocumented)   Programmer, applications None   CM/SW Referral   Tour manager)   Social/Functional History   Lives With Family   Type of Home Trailer   Home Layout One level   Home Access Stairs to enter with rails   Entrance Stairs - Number of Steps 4-5   Entrance Stairs - Rails Both   Bathroom  Shower/Tub IT consultant None   Receives Help From Family   ADL Assistance Independent   Homemaking Assistance Independent   Ambulation Assistance Independent   Transfer Assistance Independent   Active Driver Yes   Patient's Driver Info But stated that her daughter, Toney Reil has been assisting with transportation.   Mode of Engineer, water   Occupation Unemployed   Discharge Planning   Type of Residence House   Living Arrangements Children;Family Members   Current Services Prior To Admission None   Potential Assistance Needed N/A   DME Ordered? No   Potential Assistance Purchasing Medications Yes  (Patient is requesting assistance with meds.)   Type of Home Care Services None   Patient expects to be discharged to: House   One/Two Story Residence One story   History of falls? 0   Services At/After Discharge   Transition of Care Consult (CM Consult) Discharge Planning   Services At/After Discharge   (Medication assistance)   [employer] Information Provided? No   Mode of Transport at Discharge Self   Confirm Follow Up Transport Family   Condition of Participation: Discharge Planning   The Plan for Transition of Care is related to the following treatment goals: Abd pain   Freedom of Choice list was provided with basic dialogue that supports the patient's individualized plan of care/goals, treatment preferences, and shares the quality data associated with the providers?  No       Readmission Assessment  Number of Days since last admission?: 8-30 days  Previous Disposition: Home with Family  Who is being Interviewed: Patient  What was the patient's/caregiver's perception as to why they think they needed to return back to the hospital?: Other (Comment) (New Dx:  Abdominal pain)  Did you visit your Primary Care Physician after you left the hospital, before you returned this time?: No  Why weren't you able to  visit your PCP?: Did not have an appointment  Did you see a specialist, such as Cardiac, Pulmonary, Orthopedic Physician, etc. after you left the hospital?: No  Who advised the patient to return to the hospital?: Self-referral, Caregiver  Does the patient report anything that got in the way of taking their medications?: No (Patient stated that she was able to obtain her meds)  In our efforts to provide the best possible care to you and others like you, can you think of anything that we could have done to help you after you  left the hospital the first time, so that you might not have needed to return so soon?: Teaching during hospitalization regarding your illness, Discharge instructions that are concise, clear, and non contradictory, Identify patient's health literacy needs      ______________________________________  Ennis Forts, BSN, RN-CM  Letta Pate- Care Management  Available via [facility]  02/02/2023  3:54 PM

## 2023-02-02 NOTE — Consults (Signed)
Session ID: [id number]  Language: Spanish  Interpreter ID: #[id number]  Interpreter Name: Lafonda Mosses

## 2023-02-02 NOTE — Consults (Signed)
Session ID: [id number]  Language: Spanish  Interpreter ID: #[id number]  Interpreter Name: Sue Lush

## 2023-02-03 LAB — EKG 12-LEAD
Atrial Rate: 101 {beats}/min
P Axis: 52 degrees
P-R Interval: 142 ms
Q-T Interval: 370 ms
QRS Duration: 86 ms
QTc Calculation (Bazett): 479 ms
R Axis: 237 degrees
T Axis: 47 degrees
Ventricular Rate: 101 {beats}/min

## 2023-02-03 LAB — POCT GLUCOSE
POC Glucose: 180 mg/dL — ABNORMAL HIGH (ref 65–117)
POC Glucose: 149 mg/dL — ABNORMAL HIGH (ref 65–117)
POC Glucose: 158 mg/dL — ABNORMAL HIGH (ref 65–117)
POC Glucose: 169 mg/dL — ABNORMAL HIGH (ref 65–117)
POC Glucose: 211 mg/dL — ABNORMAL HIGH (ref 65–117)

## 2023-02-03 LAB — BASIC METABOLIC PANEL
Anion Gap: 6 mmol/L (ref 5–15)
BUN/Creatinine Ratio: 14 (ref 12–20)
BUN: 17 MG/DL (ref 6–20)
CO2: 21 mmol/L (ref 21–32)
Calcium: 8.2 MG/DL — ABNORMAL LOW (ref 8.5–10.1)
Chloride: 112 mmol/L — ABNORMAL HIGH (ref 97–108)
Creatinine: 1.22 MG/DL — ABNORMAL HIGH (ref 0.55–1.02)
Est, Glom Filt Rate: 58 mL/min/{1.73_m2} — ABNORMAL LOW (ref >60)
Glucose: 166 mg/dL — ABNORMAL HIGH (ref 65–100)
Potassium: 3.9 mmol/L (ref 3.5–5.1)
Sodium: 139 mmol/L (ref 136–145)

## 2023-02-03 MED ORDER — LACTATED RINGERS IV SOLN
INTRAVENOUS | Status: DC
Start: 2023-02-03 — End: 2023-02-03

## 2023-02-03 MED ORDER — OXYCODONE HCL 5 MG PO TABS
5 | ORAL | Status: DC | PRN
Start: 2023-02-03 — End: 2023-02-04
  Administered 2023-02-03 – 2023-02-04 (×3): 10 mg via ORAL

## 2023-02-03 MED ORDER — MORPHINE SULFATE (PF) 2 MG/ML IV SOLN
2 | INTRAVENOUS | Status: DC | PRN
Start: 2023-02-03 — End: 2023-02-04
  Administered 2023-02-04: 03:00:00 2 mg via INTRAVENOUS

## 2023-02-03 MED ORDER — SUGAMMADEX SODIUM 200 MG/2ML IV SOLN
200 MG/2ML | INTRAVENOUS | Status: DC | PRN
  Administered 2023-02-03: 18:00:00 200 via INTRAVENOUS

## 2023-02-03 MED ORDER — FENTANYL CITRATE (PF) 100 MCG/2ML IJ SOLN
100 MCG/2ML | INTRAMUSCULAR | Status: DC | PRN
  Administered 2023-02-03 (×2): 50 via INTRAVENOUS

## 2023-02-03 MED ORDER — DEXAMETHASONE SODIUM PHOSPHATE 4 MG/ML IJ SOLN: 4 MG/ML | INTRAMUSCULAR | Status: AC

## 2023-02-03 MED ORDER — HYDROMORPHONE HCL 2 MG/ML IJ SOLN
2 MG/ML | INTRAMUSCULAR | Status: DC | PRN
  Administered 2023-02-03: 18:00:00 .5 via INTRAVENOUS

## 2023-02-03 MED ORDER — ACETAMINOPHEN 500 MG PO TABS
500 | Freq: Four times a day (QID) | ORAL | Status: DC | PRN
Start: 2023-02-03 — End: 2023-02-04

## 2023-02-03 MED ORDER — FAMOTIDINE (PF) 20 MG/2ML IV SOLN: 20 MG/2ML | INTRAVENOUS | Status: AC

## 2023-02-03 MED ORDER — METOPROLOL SUCCINATE ER 50 MG PO TB24
50 | Freq: Every day | ORAL | Status: DC
Start: 2023-02-03 — End: 2023-02-04
  Administered 2023-02-04: 13:00:00 100 mg via ORAL

## 2023-02-03 MED ORDER — SUCCINYLCHOLINE CHLORIDE 100 MG/5ML IV SOSY
100 MG/5ML | INTRAVENOUS | Status: DC | PRN
  Administered 2023-02-03: 17:00:00 100 via INTRAVENOUS

## 2023-02-03 MED ORDER — FENTANYL CITRATE (PF) 100 MCG/2ML IJ SOLN
100 | Freq: Once | INTRAMUSCULAR | Status: DC | PRN
Start: 2023-02-03 — End: 2023-02-03

## 2023-02-03 MED ORDER — BUPIVACAINE HCL (PF) 0.5 % IJ SOLN: 0.5 % | INTRAMUSCULAR | Status: AC

## 2023-02-03 MED ORDER — SUGAMMADEX SODIUM 200 MG/2ML IV SOLN: 200 MG/2ML | INTRAVENOUS | Status: AC

## 2023-02-03 MED ORDER — LACTATED RINGERS IV SOLN
INTRAVENOUS | Status: DC | PRN
Start: 2023-02-03 — End: 2023-02-03
  Administered 2023-02-03: 17:00:00 via INTRAVENOUS

## 2023-02-03 MED ORDER — HYDROMORPHONE HCL 2 MG/ML IJ SOLN: 2 MG/ML | INTRAMUSCULAR | Status: AC

## 2023-02-03 MED ORDER — ONDANSETRON HCL 4 MG/2ML IJ SOLN
4 | Freq: Once | INTRAMUSCULAR | Status: DC | PRN
Start: 2023-02-03 — End: 2023-02-03

## 2023-02-03 MED ORDER — OXYCODONE HCL 5 MG PO TABS
5 | ORAL | Status: DC | PRN
Start: 2023-02-03 — End: 2023-02-04

## 2023-02-03 MED ORDER — ONDANSETRON HCL 4 MG/2ML IJ SOLN
4 | Freq: Once | INTRAMUSCULAR | Status: AC
Start: 2023-02-03 — End: 2023-02-03
  Administered 2023-02-03: 17:00:00 4 mg via INTRAVENOUS

## 2023-02-03 MED ORDER — MIDAZOLAM HCL (PF) 2 MG/2ML IJ SOLN
2 | Freq: Once | INTRAMUSCULAR | Status: DC | PRN
Start: 2023-02-03 — End: 2023-02-03

## 2023-02-03 MED ORDER — FENTANYL CITRATE (PF) 100 MCG/2ML IJ SOLN: 100 MCG/2ML | INTRAMUSCULAR | Status: AC

## 2023-02-03 MED ORDER — DIPHENHYDRAMINE HCL 50 MG/ML IJ SOLN
50 | Freq: Once | INTRAMUSCULAR | Status: DC | PRN
Start: 2023-02-03 — End: 2023-02-03

## 2023-02-03 MED ORDER — EPHEDRINE SULFATE-NACL 50-0.9 MG/5ML-% IV SOSY
50-0.9 MG/5ML-% | INTRAVENOUS | Status: DC | PRN
  Administered 2023-02-03: 18:00:00 10 via INTRAVENOUS

## 2023-02-03 MED ORDER — PROPOFOL 200 MG/20ML IV EMUL: 200 MG/20ML | INTRAVENOUS | Status: AC

## 2023-02-03 MED ORDER — HYDROMORPHONE 0.5MG/0.5ML IJ SOLN
1 | Status: DC | PRN
Start: 2023-02-03 — End: 2023-02-03
  Administered 2023-02-03 (×2): 0.5 mg via INTRAVENOUS

## 2023-02-03 MED ORDER — ROCURONIUM BROMIDE 50 MG/5ML IV SOLN
50 MG/5ML | INTRAVENOUS | Status: DC | PRN
  Administered 2023-02-03: 18:00:00 20 via INTRAVENOUS
  Administered 2023-02-03: 17:00:00 10 via INTRAVENOUS

## 2023-02-03 MED ORDER — HYDROMORPHONE 0.5MG/0.5ML IJ SOLN
1 | Status: DC | PRN
Start: 2023-02-03 — End: 2023-02-03

## 2023-02-03 MED ORDER — FAMOTIDINE (PF) 20 MG/2ML IV SOLN
20 MG/2ML | INTRAVENOUS | Status: DC | PRN
  Administered 2023-02-03: 18:00:00 20 via INTRAVENOUS

## 2023-02-03 MED ORDER — INDOCYANINE GREEN 25 MG IV SOLR
25 | Freq: Once | INTRAVENOUS | Status: AC
Start: 2023-02-03 — End: 2023-02-03
  Administered 2023-02-03: 17:00:00 7.5 mg via INTRAVENOUS

## 2023-02-03 MED ORDER — ROCURONIUM BROMIDE 50 MG/5ML IV SOLN: 50 MG/5ML | INTRAVENOUS | Status: AC

## 2023-02-03 MED ORDER — BUPIVACAINE HCL 0.5 % IJ SOLN
0.5 | INTRAMUSCULAR | Status: DC | PRN
Start: 2023-02-03 — End: 2023-02-03
  Administered 2023-02-03: 18:00:00 30

## 2023-02-03 MED ORDER — SUCCINYLCHOLINE CHLORIDE 100 MG/5ML IV SOSY: 100 MG/5ML | INTRAVENOUS | Status: AC

## 2023-02-03 MED ORDER — MIDAZOLAM HCL 2 MG/2ML IJ SOLN
2 | INTRAMUSCULAR | Status: DC | PRN
Start: 2023-02-03 — End: 2023-02-03
  Administered 2023-02-03: 17:00:00 2 via INTRAVENOUS

## 2023-02-03 MED ORDER — LIDOCAINE HCL (PF) 1 % IJ SOLN
1 | Freq: Once | INTRAMUSCULAR | Status: DC | PRN
Start: 2023-02-03 — End: 2023-02-03

## 2023-02-03 MED ORDER — MIDAZOLAM HCL 2 MG/2ML IJ SOLN: 2 MG/2ML | INTRAMUSCULAR | Status: AC

## 2023-02-03 MED ORDER — PROPOFOL 200 MG/20ML IV EMUL
200 | INTRAVENOUS | Status: DC | PRN
Start: 2023-02-03 — End: 2023-02-03
  Administered 2023-02-03: 17:00:00 160 via INTRAVENOUS
  Administered 2023-02-03: 17:00:00 200 via INTRAVENOUS

## 2023-02-03 MED ORDER — DEXAMETHASONE SODIUM PHOSPHATE 4 MG/ML IJ SOLN
4 MG/ML | INTRAMUSCULAR | Status: DC | PRN
  Administered 2023-02-03: 18:00:00 8 via INTRAVENOUS

## 2023-02-03 MED ORDER — METOCLOPRAMIDE HCL 5 MG/ML IJ SOLN
5 | Freq: Four times a day (QID) | INTRAMUSCULAR | Status: DC
Start: 2023-02-03 — End: 2023-02-04
  Administered 2023-02-04 (×2): 5 mg via INTRAVENOUS

## 2023-02-03 MED ORDER — SODIUM CHLORIDE (PF) 0.9 % IJ SOLN
0.9 | INTRAMUSCULAR | Status: DC | PRN
Start: 2023-02-03 — End: 2023-02-03

## 2023-02-03 MED FILL — BRIDION 200 MG/2ML IV SOLN: 200 MG/2ML | INTRAVENOUS | Qty: 2

## 2023-02-03 MED FILL — HUMULIN N 100 UNIT/ML SC SUSP: 100 UNIT/ML | SUBCUTANEOUS | Qty: 7

## 2023-02-03 MED FILL — SUCCINYLCHOLINE CHLORIDE 100 MG/5ML IV SOSY: 100 MG/5ML | INTRAVENOUS | Qty: 5

## 2023-02-03 MED FILL — ONDANSETRON HCL 4 MG/2ML IJ SOLN: 4 MG/2ML | INTRAMUSCULAR | Qty: 2

## 2023-02-03 MED FILL — METOCLOPRAMIDE HCL 5 MG/ML IJ SOLN: 5 MG/ML | INTRAMUSCULAR | Qty: 2

## 2023-02-03 MED FILL — FAMOTIDINE (PF) 20 MG/2 ML IV SOLN: 20 MG/2ML | INTRAVENOUS | Qty: 2

## 2023-02-03 MED FILL — BUPIVACAINE HCL (PF) 0.5 % IJ SOLN: 0.5 % | INTRAMUSCULAR | Qty: 30

## 2023-02-03 MED FILL — HYDROMORPHONE HCL 2 MG/ML IJ SOLN: 2 MG/ML | INTRAMUSCULAR | Qty: 1

## 2023-02-03 MED FILL — INSULIN LISPRO 100 UNIT/ML IJ SOLN: 100 UNIT/ML | INTRAMUSCULAR | Qty: 1

## 2023-02-03 MED FILL — PROCHLORPERAZINE EDISYLATE 10 MG/2ML IJ SOLN: 10 MG/2ML | INTRAMUSCULAR | Qty: 2

## 2023-02-03 MED FILL — MORPHINE SULFATE 4 MG/ML IJ SOLN: 4 mg/mL | INTRAMUSCULAR | Qty: 1

## 2023-02-03 MED FILL — MIDAZOLAM HCL 2 MG/2ML IJ SOLN: 2 MG/ML | INTRAMUSCULAR | Qty: 2

## 2023-02-03 MED FILL — LACTATED RINGERS IV SOLN: INTRAVENOUS | Qty: 1000

## 2023-02-03 MED FILL — INDOCYANINE GREEN 25 MG IV SOLR: 25 MG | INTRAVENOUS | Qty: 10

## 2023-02-03 MED FILL — HYDRALAZINE HCL 20 MG/ML IJ SOLN: 20 MG/ML | INTRAMUSCULAR | Qty: 1

## 2023-02-03 MED FILL — PANTOPRAZOLE SODIUM 40 MG IV SOLR: 40 MG | INTRAVENOUS | Qty: 40

## 2023-02-03 MED FILL — ENOXAPARIN SODIUM 40 MG/0.4ML IJ SOSY: 40 MG/0.4ML | INTRAMUSCULAR | Qty: 0.4

## 2023-02-03 MED FILL — DIPRIVAN 200 MG/20ML IV EMUL: 200 MG/20ML | INTRAVENOUS | Qty: 20

## 2023-02-03 MED FILL — DEXAMETHASONE SODIUM PHOSPHATE 4 MG/ML IJ SOLN: 4 MG/ML | INTRAMUSCULAR | Qty: 1

## 2023-02-03 MED FILL — HYDROMORPHONE HCL 1 MG/ML IJ SOLN: 1 MG/ML | INTRAMUSCULAR | Qty: 0.5

## 2023-02-03 MED FILL — OXYCODONE HCL 5 MG PO TABS: 5 MG | ORAL | Qty: 2

## 2023-02-03 MED FILL — ROCURONIUM BROMIDE 50 MG/5ML IV SOLN: 50 MG/5ML | INTRAVENOUS | Qty: 5

## 2023-02-03 MED FILL — FENTANYL CITRATE (PF) 100 MCG/2ML IJ SOLN: 100 MCG/2ML | INTRAMUSCULAR | Qty: 2

## 2023-02-03 MED FILL — LORAZEPAM 0.5 MG PO TABS: 0.5 MG | ORAL | Qty: 1

## 2023-02-03 MED FILL — METOPROLOL SUCCINATE ER 50 MG PO TB24: 50 MG | ORAL | Qty: 1

## 2023-02-03 NOTE — Plan of Care (Signed)
Problem: Discharge Planning  Goal: Discharge to home or other facility with appropriate resources  Outcome: Progressing     Problem: Pain  Goal: Verbalizes/displays adequate comfort level or baseline comfort level  02/03/2023 0131 by Elna Breslow, LPN  Outcome: Progressing  02/02/2023 1332 by Dorothyann Gibbs., RN  Outcome: Progressing     Problem: Safety - Adult  Goal: Free from fall injury  Outcome: Progressing     Problem: Chronic Conditions and Co-morbidities  Goal: Patient's chronic conditions and co-morbidity symptoms are monitored and maintained or improved  Outcome: Progressing

## 2023-02-03 NOTE — Other (Addendum)
TRANSFER - OUT REPORT:    Verbal report given to Mardene Celeste, RN on [employer] [facility]  being transferred to 420 for routine post-op       Report consisted of patient's Situation, Background, Assessment and   Recommendations(SBAR).     Information from the following report(s) Nurse Handoff Report, Intake/Output, and MAR was reviewed with the receiving nurse.           Lines:   Peripheral IV 02/01/23 Distal;Anterior;Left Cephalic (Active)   Site Assessment Clean, dry & intact 02/03/23 1230   Line Status Infusing;Flushed 02/03/23 1230   Line Care Connections checked and tightened 02/03/23 1230   Phlebitis Assessment No symptoms 02/03/23 1230   Infiltration Assessment 0 02/03/23 1230   Alcohol Cap Used Yes 02/03/23 1230   Dressing Status Clean, dry & intact 02/03/23 1230   Dressing Type Transparent 02/03/23 1230        Opportunity for questions and clarification was provided.      Patient transported with:  Registered Nurse

## 2023-02-03 NOTE — Anesthesia Post-Procedure Evaluation (Signed)
Department of Anesthesiology  Postprocedure Note    Patient: Melinda [name]  MRN: [id number]  Birthdate: [birth year]  Date of evaluation: 02/03/2023    Procedure Summary       Date: 02/03/23 Room / Location: [facility] OR F5 / [facility] MA[facility] OR    Anesthesia Start: 1316 Anesthesia Stop: 1436    Procedure: ROBOTIC LAPAROSCOPIC CHOLECYSTECTOMY with Indocyanine green (Abdomen) Diagnosis:       Biliary dyskinesia      (Biliary dyskinesia [K82.8])    Surgeons: Verdis Frederickson, MD Responsible Provider: Maud Deed, DO    Anesthesia Type: general ASA Status: 2            Anesthesia Type: No value filed.    Aldrete Phase I: Aldrete Score: 8    Aldrete Phase II:      Anesthesia Post Evaluation    Patient location during evaluation: PACU  Patient participation: complete - patient participated  Level of consciousness: awake  Airway patency: patent  Nausea & Vomiting: no vomiting and no nausea  Cardiovascular status: hemodynamically stable, blood pressure returned to baseline and hypertensive  Respiratory status: acceptable  Hydration status: stable  Comments: Patient seen and examined.  Ready for discharge from PACU.  Multimodal analgesia pain management approach  Pain management: adequate    No notable events documented.

## 2023-02-03 NOTE — Anesthesia Pre-Procedure Evaluation (Signed)
Department of Anesthesiology  Preprocedure Note       Name:  Melinda [name]   Age:  42 y.o.  DOB:  07-16-1981                                          MRN:  [id number]         Date:  02/03/2023      Surgeon: Moishe Spice):  Verdis Frederickson, MD    Procedure: Procedure(s):  CHOLECYSTECTOMY LAPAROSCOPIC ROBOTIC    Medications prior to admission:   Prior to Admission medications    Medication Sig Start Date End Date Taking? Authorizing Provider   naloxone [facility]) 4 MG/0.1ML LIQD nasal spray 1 spray by Nasal route as needed for Opioid Reversal 01/20/23   Horald Chestnut, DO   metoprolol succinate (TOPROL XL) 50 MG extended release tablet Take 1 tablet by mouth daily 01/21/23   Horald Chestnut, DO   ondansetron (ZOFRAN-ODT) 4 MG disintegrating tablet Take 1 tablet by mouth every 8 hours as needed for Nausea or Vomiting 01/20/23   Horald Chestnut, DO   polyethylene glycol (GLYCOLAX) 17 g packet Take 1 packet by mouth daily as needed for Constipation 01/20/23 02/19/23  Horald Chestnut, DO   ferrous sulfate (IRON 325) 325 (65 Fe) MG tablet Take 1 tablet by mouth every morning (before breakfast) 01/20/23   Horald Chestnut, DO   spironolactone (ALDACTONE) 25 MG tablet Take 1 tablet by mouth daily    [provider]   lisinopril (PRINIVIL;ZESTRIL) 40 MG tablet Take 1 tablet by mouth daily    [provider]   amLODIPine (NORVASC) 10 MG tablet Take 1 tablet by mouth daily    [provider]   insulin 70-30 (NOVOLIN 70/30) (70-30) 100 UNIT per ML injection vial Inject 15 Units into the skin 2 times daily 07/10/21   Automatic Reconciliation, Ar       Current medications:    Current Facility-Administered Medications   Medication Dose Route Frequency Provider Last Rate Last Admin   . [START ON 02/04/2023] metoprolol succinate (TOPROL XL) extended release tablet 100 mg  100 mg Oral Daily Tefera, Mesfin A, MD       . prochlorperazine (COMPAZINE) injection 10 mg  10 mg IntraVENous Q6H PRN Luella Cook, APRN - NP   10 mg at  02/03/23 0021   . metoclopramide (REGLAN) injection 5 mg  5 mg IntraVENous Q6H Pryor Ochoa, MD   5 mg at 02/03/23 0842   . amLODIPine (NORVASC) tablet 10 mg  10 mg Oral Daily Thedore Mins, Amarinder P, MD   10 mg at 02/02/23 1139   . ferrous sulfate (IRON 325) tablet 325 mg  325 mg Oral QAM AC Singh, Amarinder P, MD   325 mg at 02/02/23 1140   . glucose chewable tablet 16 g  4 tablet Oral PRN Singh, Amarinder P, MD       . dextrose bolus 10% 125 mL  125 mL IntraVENous PRN Singh, Amarinder P, MD        Or   . dextrose bolus 10% 250 mL  250 mL IntraVENous PRN Singh, Amarinder P, MD       . glucagon injection 1 mg  1 mg SubCUTAneous PRN Singh, Amarinder P, MD       . dextrose 10 % infusion   IntraVENous Continuous PRN Nanda Quinton, MD       .  sodium chloride flush 0.9 % injection 5-40 mL  5-40 mL IntraVENous 2 times per day Nanda Quinton, MD   5 mL at 02/03/23 0845   . sodium chloride flush 0.9 % injection 5-40 mL  5-40 mL IntraVENous PRN Singh, Amarinder P, MD       . 0.9 % sodium chloride infusion   IntraVENous PRN Thedore Mins, Amarinder P, MD       . potassium chloride (KLOR-CON) extended release tablet 40 mEq  40 mEq Oral PRN Singh, Amarinder P, MD        Or   . potassium bicarb-citric acid (EFFER-K) effervescent tablet 40 mEq  40 mEq Oral PRN Thedore Mins, Amarinder P, MD        Or   . potassium chloride 10 mEq/100 mL IVPB (Peripheral Line)  10 mEq IntraVENous PRN Thedore Mins, Amarinder P, MD       . magnesium sulfate 2000 mg in 50 mL IVPB premix  2,000 mg IntraVENous PRN Singh, Amarinder P, MD       . enoxaparin (LOVENOX) injection 40 mg  40 mg SubCUTAneous QPM Thedore Mins, Amarinder P, MD   40 mg at 02/01/23 2131   . ondansetron (ZOFRAN-ODT) disintegrating tablet 4 mg  4 mg Oral Q8H PRN Thedore Mins, Amarinder P, MD        Or   . ondansetron (ZOFRAN) injection 4 mg  4 mg IntraVENous Q6H PRN Nanda Quinton, MD   4 mg at 02/03/23 1044   . polyethylene glycol (GLYCOLAX) packet 17 g  17 g Oral Daily PRN Thedore Mins, Amarinder P, MD       .  acetaminophen (TYLENOL) tablet 650 mg  650 mg Oral Q6H PRN Thedore Mins, Amarinder Demetrius Charity, MD        Or   . acetaminophen (TYLENOL) suppository 650 mg  650 mg Rectal Q6H PRN Singh, Amarinder P, MD       . insulin lispro (HUMALOG) injection vial 0-4 Units  0-4 Units SubCUTAneous TID WC Singh, Amarinder P, MD       . insulin lispro (HUMALOG) injection vial 0-4 Units  0-4 Units SubCUTAneous Nightly Singh, Amarinder P, MD       . 0.9 % sodium chloride infusion   IntraVENous Continuous Nanda Quinton, MD 75 mL/hr at 02/03/23 0246 New Bag at 02/03/23 0246   . morphine sulfate (PF) injection 4 mg  4 mg IntraVENous Q4H PRN Nanda Quinton, MD   4 mg at 02/03/23 1044   . hydrALAZINE (APRESOLINE) injection 10 mg  10 mg IntraVENous Q6H PRN Nanda Quinton, MD   10 mg at 02/03/23 1149   . pantoprazole (PROTONIX) 40 mg in sodium chloride (PF) 0.9 % 10 mL injection  40 mg IntraVENous Daily Nanda Quinton, MD   40 mg at 02/03/23 0842   . LORazepam (ATIVAN) tablet 0.5 mg  0.5 mg Oral Q4H PRN Nanda Quinton, MD   0.5 mg at 02/02/23 2143   . insulin NPH (HumuLIN N;NovoLIN N) injection vial 7 Units  7 Units SubCUTAneous BID AC Edwards, Sabrina C, APRN - NP       . dicyclomine (BENTYL) tablet 20 mg  20 mg Oral Q6H PRN Luella Cook, APRN - NP   20 mg at 02/01/23 2204       Allergies:  No Known Allergies    Problem List:    Patient Active Problem List   Diagnosis Code   . Nausea and vomiting R11.2   . Sepsis (HCC) A41.9   .  Abdominal pain R10.9   . UTI (urinary tract infection) N39.0   . AKI (acute kidney injury) (HCC) N17.9   . Leukocytosis D72.829   . Elevated lactic acid level R79.89   . Tachycardia R00.0   . DM (diabetes mellitus) (HCC) E11.9   . HTN (hypertension) I10   . Hyperlipidemia E78.5       Past Medical History:        Diagnosis Date   . High blood pressure    . High cholesterol    . Type 2 diabetes mellitus (HCC)     Controled       Past Surgical History:        Procedure Laterality Date   . CAPSULE ENDOSCOPY  N/A 01/20/2023    ESOPHAGEAL CAPSULE ENDOSCOPY remove at 1624PM performed by Glyn Ade, MD at [facility] ENDOSCOPY   . COLONOSCOPY N/A 01/19/2023    COLONOSCOPY DIAGNOSTIC performed by Glyn Ade, MD at [facility] ENDOSCOPY   . OTHER SURGICAL HISTORY Left     Rentia attachment   . UPPER GASTROINTESTINAL ENDOSCOPY N/A 01/17/2023    ESOPHAGOGASTRODUODENOSCOPY performed by Glyn Ade, MD at [facility] ENDOSCOPY   . UPPER GASTROINTESTINAL ENDOSCOPY N/A 01/17/2023    ESOPHAGOGASTRODUODENOSCOPY BIOPSY performed by Glyn Ade, MD at [facility] ENDOSCOPY   . UPPER GASTROINTESTINAL ENDOSCOPY N/A 01/18/2023    ESOPHAGOGASTRODUODENOSCOPY performed by Glyn Ade, MD at [facility] ENDOSCOPY       Social History:    Social History     Tobacco Use   . Smoking status: Never   . Smokeless tobacco: Never   Substance Use Topics   . Alcohol use: Never                                Counseling given: Not Answered      Vital Signs (Current):   Vitals:    02/03/23 0735 02/03/23 0945 02/03/23 1057 02/03/23 1146   BP: (!) 161/83 (!) 167/83 (!) 181/94 (!) 168/91   Pulse: 87  95 91   Resp: 18  18    Temp: 98.1 F (36.7 C)  97.8 F (36.6 C)    TempSrc: Oral  Oral    SpO2: 99%  98%    Weight:       Height:                                                  BP Readings from Last 3 Encounters:   02/03/23 (!) 168/91   01/20/23 (!) 163/90   06/29/22 (!) 165/97       NPO Status:                                                                                 BMI:   Wt Readings from Last 3 Encounters:   02/01/23 65.8 kg (145 lb)   01/18/23 65.8 kg (145 lb)   06/28/22 59.9 kg (132 lb)     Body mass index is 29.29 kg/m.  CBC:   Lab Results   Component Value Date/Time    WBC 8.8 02/02/2023 05:13 AM    RBC 3.62 02/02/2023 05:13 AM    HGB 9.2 02/02/2023 05:13 AM    HCT 28.0 02/02/2023 05:13 AM    MCV 77.3 02/02/2023 05:13 AM    RDW 20.2 02/02/2023 05:13 AM    PLT 447 02/02/2023 05:13 AM       CMP:   Lab Results   Component Value Date/Time    NA 139 02/03/2023 02:36 AM    K 3.9  02/03/2023 02:36 AM    CL 112 02/03/2023 02:36 AM    CO2 21 02/03/2023 02:36 AM    BUN 17 02/03/2023 02:36 AM    CREATININE 1.22 02/03/2023 02:36 AM    GFRAA >60 07/10/2021 12:01 AM    AGRATIO 0.7 02/01/2023 05:34 PM    AGRATIO 0.8 10/22/2021 10:00 AM    LABGLOM 58 02/03/2023 02:36 AM    GLUCOSE 166 02/03/2023 02:36 AM    PROT 7.7 02/01/2023 05:34 PM    CALCIUM 8.2 02/03/2023 02:36 AM    BILITOT 0.4 02/01/2023 05:34 PM    ALKPHOS 99 02/01/2023 05:34 PM    ALKPHOS 98 10/22/2021 10:00 AM    AST 35 02/01/2023 05:34 PM    ALT 25 02/01/2023 05:34 PM       POC Tests:   Recent Labs     02/03/23  1132   POCGLU 169*       Coags: No results found for: "PROTIME", "INR", "APTT"    HCG (If Applicable):   Lab Results   Component Value Date    PREGTESTUR Negative 01/14/2023        ABGs: No results found for: "PHART", "PO2ART", "PCO2ART", "HCO3ART", "BEART", "O2SATART"     Type & Screen (If Applicable):  No results found for: "LABABO", "LABRH"    Drug/Infectious Status (If Applicable):  No results found for: "HIV", "HEPCAB"    COVID-19 Screening (If Applicable):   Lab Results   Component Value Date/Time    COVID19 Not detected 06/28/2022 09:52 PM           Anesthesia Evaluation  Patient summary reviewed  Airway: Mallampati: III       Comment: Large tongue     Dental: normal exam         Pulmonary:Negative Pulmonary ROS and normal exam                               Cardiovascular:  Exercise tolerance: good (>4 METS)  (+) hypertension: moderate                  Neuro/Psych:   Negative Neuro/Psych ROS              GI/Hepatic/Renal: Neg GI/Hepatic/Renal ROS  (+) GERD: poorly controlled          Endo/Other:    (+) DiabetesType II DM.                 Abdominal: normal exam            Vascular:          Other Findings:       Anesthesia Plan      general     ASA 2       Induction: intravenous.      Anesthetic plan and risks discussed with patient.    Use of blood products  discussed with patient whom.    Plan discussed with  CRNA.                Riesa Pope, MD   02/03/2023

## 2023-02-03 NOTE — Brief Op Note (Signed)
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]    Post-Op Diagnosis: Same       Procedure(s):  ROBOTIC LAPAROSCOPIC CHOLECYSTECTOMY with indocyanine green    Surgeon(s):  Verdis Frederickson, MD    Assistant:  Surgical Assistant: Viviana Simpler    Anesthesia: General    Estimated Blood Loss (mL): Minimal    Complications: None    Specimens:   ID Type Source Tests Collected by Time Destination   1 : gallbladder Tissue Gallbladder SURGICAL PATHOLOGY Verdis Frederickson, MD Feb 18, 2023 1357        Implants:  none      Drains:   NG/OG/NJ/NE Tube Orogastric 16 fr Center mouth (Active)       Findings:  Infection Present At Time Of Surgery (PATOS) (choose all levels that have infection present):  No infection present  Other Findings: slightly edematous gallbladder    Electronically signed by Nancee Liter, MD on 2023/02/18 at 2:17 PM

## 2023-02-03 NOTE — Progress Notes (Signed)
Patient Fall protocol  Yellow arm band applied to patient.  Yellow non-skid socks placed on Patient.    Bed in low position, all side rails up, call bell in reach.  Patient and family instructed on "Call Don't Fall" Protocol   -Use your call bell, wait for assistance, staff (not Family) will assist you to get up and move about.  Patient and Family verbalize understanding of Fall Precautions and the " Call Don't Fall" Protocol

## 2023-02-03 NOTE — Consults (Signed)
Assessment:     42 yo woman with biliary colic    Plan:     OR today for cholecystectomy  NPO  Discussed risks and benefits with the patient and she would like to proceed      Signed By: Jarome Matin, MD  [facility]  [phone]    February 03, 2023          General Surgery Consult    Subjective:      Melinda [name] is a 42 y.o.  female who presents with one month history of abdominal pain with nausea and vomiting. She has been previously admitted and underwent endoscopy to evaluate her abdominal pain.  When the pain worsened she returned to the ER and had a HIDA scan which was noted to be abnormal.      Past Medical History:   Diagnosis Date    High blood pressure     High cholesterol     Type 2 diabetes mellitus (HCC)     Controled     Past Surgical History:   Procedure Laterality Date    CAPSULE ENDOSCOPY N/A 01/20/2023    ESOPHAGEAL CAPSULE ENDOSCOPY remove at 1624PM performed by Glyn Ade, MD at [facility] ENDOSCOPY    COLONOSCOPY N/A 01/19/2023    COLONOSCOPY DIAGNOSTIC performed by Glyn Ade, MD at [facility] ENDOSCOPY    OTHER SURGICAL HISTORY Left     Rentia attachment    UPPER GASTROINTESTINAL ENDOSCOPY N/A 01/17/2023    ESOPHAGOGASTRODUODENOSCOPY performed by Glyn Ade, MD at [facility] ENDOSCOPY    UPPER GASTROINTESTINAL ENDOSCOPY N/A 01/17/2023    ESOPHAGOGASTRODUODENOSCOPY BIOPSY performed by Glyn Ade, MD at [facility] ENDOSCOPY    UPPER GASTROINTESTINAL ENDOSCOPY N/A 01/18/2023    ESOPHAGOGASTRODUODENOSCOPY performed by Glyn Ade, MD at [facility] ENDOSCOPY      No family history on file.  Social History     Socioeconomic History    Marital status: Married   Tobacco Use    Smoking status: Never    Smokeless tobacco: Never   Vaping Use    Vaping Use: Never used   Substance and Sexual Activity    Alcohol use: Never    Drug use: Never     Social Determinants of Health     Food Insecurity: Food Insecurity Present (02/01/2023)    Hunger Vital Sign     Worried About Running Out of Food in the Last Year:  Sometimes true     Ran Out of Food in the Last Year: Sometimes true   Transportation Needs: No Transportation Needs (02/01/2023)    PRAPARE - Therapist, art (Medical): No     Lack of Transportation (Non-Medical): No   Housing Stability: Low Risk  (02/01/2023)    Housing Stability Vital Sign     Unable to Pay for Housing in the Last Year: No     Number of Places Lived in the Last Year: 1     Unstable Housing in the Last Year: No      Current Facility-Administered Medications   Medication Dose Route Frequency    [START ON 02/04/2023] metoprolol succinate (TOPROL XL) extended release tablet 100 mg  100 mg Oral Daily    prochlorperazine (COMPAZINE) injection 10 mg  10 mg IntraVENous Q6H PRN    metoclopramide (REGLAN) injection 5 mg  5 mg IntraVENous Q6H    amLODIPine (NORVASC) tablet 10 mg  10 mg Oral Daily    ferrous sulfate (IRON 325) tablet 325 mg  325 mg Oral QAM AC    glucose chewable tablet 16 g  4 tablet Oral PRN    dextrose bolus 10% 125 mL  125 mL IntraVENous PRN    Or    dextrose bolus 10% 250 mL  250 mL IntraVENous PRN    glucagon injection 1 mg  1 mg SubCUTAneous PRN    dextrose 10 % infusion   IntraVENous Continuous PRN    sodium chloride flush 0.9 % injection 5-40 mL  5-40 mL IntraVENous 2 times per day    sodium chloride flush 0.9 % injection 5-40 mL  5-40 mL IntraVENous PRN    0.9 % sodium chloride infusion   IntraVENous PRN    potassium chloride (KLOR-CON) extended release tablet 40 mEq  40 mEq Oral PRN    Or    potassium bicarb-citric acid (EFFER-K) effervescent tablet 40 mEq  40 mEq Oral PRN    Or    potassium chloride 10 mEq/100 mL IVPB (Peripheral Line)  10 mEq IntraVENous PRN    magnesium sulfate 2000 mg in 50 mL IVPB premix  2,000 mg IntraVENous PRN    enoxaparin (LOVENOX) injection 40 mg  40 mg SubCUTAneous QPM    ondansetron (ZOFRAN-ODT) disintegrating tablet 4 mg  4 mg Oral Q8H PRN    Or    ondansetron (ZOFRAN) injection 4 mg  4 mg IntraVENous Q6H PRN    polyethylene  glycol (GLYCOLAX) packet 17 g  17 g Oral Daily PRN    acetaminophen (TYLENOL) tablet 650 mg  650 mg Oral Q6H PRN    Or    acetaminophen (TYLENOL) suppository 650 mg  650 mg Rectal Q6H PRN    insulin lispro (HUMALOG) injection vial 0-4 Units  0-4 Units SubCUTAneous TID WC    insulin lispro (HUMALOG) injection vial 0-4 Units  0-4 Units SubCUTAneous Nightly    0.9 % sodium chloride infusion   IntraVENous Continuous    morphine sulfate (PF) injection 4 mg  4 mg IntraVENous Q4H PRN    hydrALAZINE (APRESOLINE) injection 10 mg  10 mg IntraVENous Q6H PRN    pantoprazole (PROTONIX) 40 mg in sodium chloride (PF) 0.9 % 10 mL injection  40 mg IntraVENous Daily    LORazepam (ATIVAN) tablet 0.5 mg  0.5 mg Oral Q4H PRN    insulin NPH (HumuLIN N;NovoLIN N) injection vial 7 Units  7 Units SubCUTAneous BID AC    dicyclomine (BENTYL) tablet 20 mg  20 mg Oral Q6H PRN      No Known Allergies    Review of Systems:     []      Unable to obtain  ROS due to  []     mental status change  []     sedated   []     intubated   [x]     Total of 12 system negative, unless specified below or in HPI:  Constitutional: negative fever, negative chills, negative weight loss  Eyes:   negative visual changes  ENT:   negative sore throat, tongue or lip swelling  Respiratory:  negative cough, negative dyspnea  Cards:  negative for chest pain, palpitations, lower extremity edema  GI:   positive for nausea, vomiting, and abdominal pain  GU:  negative for frequency, dysuria  Integument:  negative for rash and pruritus  Heme:  negative for easy bruising and gum/nose bleeding  Musculoskel: negative for myalgias,  back pain and muscle weakness  Neuro:  negative for headaches, dizziness, vertigo  Psych:  negative for feelings of  anxiety, depression     Objective:      Patient Vitals for the past 8 hrs:   BP Temp Temp src Pulse Resp SpO2   02/03/23 0945 (!) 167/83 -- -- -- -- --   02/03/23 0735 (!) 161/83 98.1 F (36.7 C) Oral 87 18 99 %   02/03/23 0730 (!) 170/91 98.1  F (36.7 C) Oral 90 18 97 %   02/03/23 0700 -- -- -- 84 -- --   02/03/23 0600 (!) 169/88 -- -- -- -- --   02/03/23 0355 (!) 172/88 98.2 F (36.8 C) Oral 93 -- 100 %       Temp (24hrs), Avg:98.3 F (36.8 C), Min:97.9 F (36.6 C), Max:98.8 F (37.1 C)      Physical Exam:  General:  Alert, cooperative, no distress, appears stated age.   Eyes:  Conjunctivae clear. PERRL, EOMs intact.   Nose: Nares normal. Septum midline. Mucosa normal. No drainage or sinus tenderness.   Mouth/Throat: Lips, mucosa, and tongue normal. Teeth and gums normal.   Neck: Supple, symmetrical, trachea midline   Back:   Symmetric, no curvature. ROM normal. No CVA tenderness.   Lungs:   Clear to auscultation bilaterally.   Heart:  Regular rate and rhythm   Abdomen:   Soft, non-tender. Bowel sounds normal. No masses,  No organomegaly.   Extremities: Extremities normal, atraumatic, no cyanosis or edema.       Skin: Skin color, texture, turgor normal. No rashes or lesions         Labs:   Recent Labs     02/02/23  0513   WBC 8.8   HGB 9.2*   HCT 28.0*   PLT 447*     Recent Labs     02/01/23  1734 02/02/23  0513 02/03/23  0236   NA 138   < > 139   K 4.2   < > 3.9   CL 108   < > 112*   CO2 22   < > 21   BUN 24*   < > 17   MG 2.2  --   --    ALT 25  --   --     < > = values in this interval not displayed.     No results for input(s): "INR" in the last 72 hours.

## 2023-02-03 NOTE — Other (Signed)
1445 Care of patient assumed.

## 2023-02-03 NOTE — Progress Notes (Signed)
Patient/caregivers speak Spanish as their preferred language for their healthcare communication. For safe communication, use the [employer] interpreter carts or call:    Maria Claudia Oystese   Senior Interpreter-Navigator [phone]  General phone: [phone] ( [phone])  Email: [email or web address]    Please always document the use of interpreting services (Interpreter's ID number) in your clinical notes.    Our interpreters are available for team members working with limited  English proficient (LEP) patients remotely, via phone or video or in person (if needed for special cases).

## 2023-02-03 NOTE — Progress Notes (Signed)
[facility]  [address] [place], [place] [address]  [phone]      Hospitalist Progress Note      NAME: Melinda [name]   DOB:  [birth year]  MRM:  [id number]    Date of service: 02/03/2023  10:33 AM       Assessment and Plan:   Abdominal pain/ abnormal HIDA scan: Gallbladder ejection fraction is diminished. EGD on 4/9 and colonoscopy 9/10 did not show any acute findings. Continue IV Protonix. Plan for surgical intervention.      2.  AKI. Improving with IVF.      3.  Hypertension. Cont metoprolol and IV hydralazine as needed  -Holding lisinopril and spironolactone due to AKI     4.  Type 2 diabetes-A1c 8.8. Continue Lantus and SSI per protocol         Subjective:     Chief Complaint:: Patient was seen and examined as a follow up for abdominal pain.  Chart was reviewed. c/o abdominal pain     ROS:  (bold if positive, if negative)    Tolerating PT  Tolerating Diet        Objective:     Last 24hrs VS reviewed since prior progress note. Most recent are:    Vitals:    02/03/23 0945   BP: (!) 167/83   Pulse:    Resp:    Temp:    SpO2:      SpO2 Readings from Last 6 Encounters:   02/03/23 99%   01/20/23 98%   06/29/22 100%   05/15/22 98%   02/16/22 99%        No intake or output data in the 24 hours ending 02/03/23 1033     Physical Exam:    Gen:  Well-developed, well-nourished, in no acute distress  HEENT:  Pink conjunctivae, PERRL, hearing intact to voice, moist mucous membranes  Neck:  Supple, without masses, thyroid non-tender  Resp:  No accessory muscle use, clear breath sounds without wheezes rales or rhonchi  Card:  No murmurs, normal S1, S2 without thrills, bruits or peripheral edema  Abd:  Soft, non-tender, non-distended, normoactive bowel sounds are present, no palpable organomegaly and no detectable hernias  Lymph:  No cervical or inguinal adenopathy  Musc:  No cyanosis or clubbing  Skin:  No rashes or ulcers, skin turgor is good  Neuro:  Cranial nerves are grossly  intact, no focal motor weakness, follows commands appropriately  Psych:  Good insight, oriented to person, place and time, alert  __________________________________________________________________  Medications Reviewed: (see below)  Medications:     Current Facility-Administered Medications   Medication Dose Route Frequency    [START ON 02/04/2023] metoprolol succinate (TOPROL XL) extended release tablet 100 mg  100 mg Oral Daily    prochlorperazine (COMPAZINE) injection 10 mg  10 mg IntraVENous Q6H PRN    metoclopramide (REGLAN) injection 5 mg  5 mg IntraVENous Q6H    amLODIPine (NORVASC) tablet 10 mg  10 mg Oral Daily    ferrous sulfate (IRON 325) tablet 325 mg  325 mg Oral QAM AC    glucose chewable tablet 16 g  4 tablet Oral PRN    dextrose bolus 10% 125 mL  125 mL IntraVENous PRN    Or    dextrose bolus 10% 250 mL  250 mL IntraVENous PRN    glucagon injection 1 mg  1 mg SubCUTAneous PRN    dextrose 10 % infusion   IntraVENous  Continuous PRN    sodium chloride flush 0.9 % injection 5-40 mL  5-40 mL IntraVENous 2 times per day    sodium chloride flush 0.9 % injection 5-40 mL  5-40 mL IntraVENous PRN    0.9 % sodium chloride infusion   IntraVENous PRN    potassium chloride (KLOR-CON) extended release tablet 40 mEq  40 mEq Oral PRN    Or    potassium bicarb-citric acid (EFFER-K) effervescent tablet 40 mEq  40 mEq Oral PRN    Or    potassium chloride 10 mEq/100 mL IVPB (Peripheral Line)  10 mEq IntraVENous PRN    magnesium sulfate 2000 mg in 50 mL IVPB premix  2,000 mg IntraVENous PRN    enoxaparin (LOVENOX) injection 40 mg  40 mg SubCUTAneous QPM    ondansetron (ZOFRAN-ODT) disintegrating tablet 4 mg  4 mg Oral Q8H PRN    Or    ondansetron (ZOFRAN) injection 4 mg  4 mg IntraVENous Q6H PRN    polyethylene glycol (GLYCOLAX) packet 17 g  17 g Oral Daily PRN    acetaminophen (TYLENOL) tablet 650 mg  650 mg Oral Q6H PRN    Or    acetaminophen (TYLENOL) suppository 650 mg  650 mg Rectal Q6H PRN    insulin lispro (HUMALOG)  injection vial 0-4 Units  0-4 Units SubCUTAneous TID WC    insulin lispro (HUMALOG) injection vial 0-4 Units  0-4 Units SubCUTAneous Nightly    0.9 % sodium chloride infusion   IntraVENous Continuous    morphine sulfate (PF) injection 4 mg  4 mg IntraVENous Q4H PRN    hydrALAZINE (APRESOLINE) injection 10 mg  10 mg IntraVENous Q6H PRN    pantoprazole (PROTONIX) 40 mg in sodium chloride (PF) 0.9 % 10 mL injection  40 mg IntraVENous Daily    LORazepam (ATIVAN) tablet 0.5 mg  0.5 mg Oral Q4H PRN    insulin NPH (HumuLIN N;NovoLIN N) injection vial 7 Units  7 Units SubCUTAneous BID AC    dicyclomine (BENTYL) tablet 20 mg  20 mg Oral Q6H PRN        Lab Data Reviewed: (see below)  Lab Review:     Recent Labs     02/01/23  1734 02/02/23  0513   WBC 8.9 8.8   HGB 10.4* 9.2*   HCT 31.9* 28.0*   PLT 562* 447*     Recent Labs     02/01/23  1734 02/02/23  0513 02/03/23  0236   NA 138 139 139   K 4.2 3.7 3.9   CL 108 111* 112*   CO2 22 21 21    BUN 24* 19 17   MG 2.2  --   --    ALT 25  --   --      No results found for: "GLUCPOC"  No results for input(s): "PH", "PCO2", "PO2", "HCO3", "FIO2" in the last 72 hours.  No results for input(s): "INR" in the last 72 hours.  @MICRORESULTS @    I have reviewed notes of prior 24hr.    Other pertinent lab:     Total time: -35- minutes. I personally saw and examined the patient during this time period.  Greater than 50% of this time was spent in counseling and coordination of care.    I personally reviewed chart, notes, data and current medications in the medical record.  I have personally examined and treated the patient at bedside during this period.  Care Plan discussed with: Patient, Nursing Staff, and >50% of time spent in counseling and coordination of care    Discussed:  Care Plan    Prophylaxis:  Lovenox    Disposition:  Home w/Family           ___________________________________________________    Attending Physician: Cyndra Numbers, MD

## 2023-02-03 NOTE — Op Note (Signed)
Patient: Melinda [name] MRN: [id number]  SSN: [id number]    Date of Birth: [birth year]  Age: 42 y.o.  Sex: female        OPERATIVE REPORT - LAPAROSCOPIC ASSISTED ROBOTIC                                                                  MULTIPORT CHOLECYSTECTOMY    PREOPERATIVE DIAGNOSIS:gallbladder dyskinesia    POSTOPERATIVE DIAGNOSIS:same    OPERATIVE PROCEDURE:  Robotic assisted laparoscopic multiport cholecystectomy with indocyanine green      SURGEON: Nancee Liter, MD    ANESTHESIA: General.    ANESTHESIOLOGIST: General    COMPLICATIONS: None    ASSISTANT: None    ESTIMATED BLOOD LOSS: Minimal.    INDICATION: Documented in the history and physical.    FINDINGS: slightly edematous gallbladder    PROCEDURE: Patient was brought in the room and placed supine on the operating table.  After general anesthesia induction and placement of endotracheal tube, patient's  abdomen was prepped and draped in standard surgical fashion. The  laparoscopic camera and CO2 insufflation apparatus were affixed to the  drapes in the usual fashion.    Through a supraumbilical incision, a Veress needle was introduced and its  intraperitoneal position was confirmed with low intra-abdominal initial pressures. CO2 insufflation was connected and pneumoperitoneum was created and maintained at 15 mmHg. An 8-mm robotic trocar was introduced and 8mm robotic camera  Was passed through and immediate area was inspected and it was confirmed that there was no  intraabdominal injury during introduction of pneumoperitoneum and the  trocar.    Under direct vision, 2 8-mm robotic ports were positioned, one to the  Left upper quadrant, one at the right upper quadrant, and another 5mm port between the umbilical and right upper quadrant port.   Patient was positioned in reverse Trendelenburg with a tilt to the  left.    The fundus was grasped and lifted up towards the diaphragm. The  infundibulum was retracted laterally and inferiorly after  releasing the  adhesions.    The junction of the cystic duct and gallbladder was clearly identified, and  an adequate length of the cystic duct was dissected out. Similarly, the cystic  artery was also dissected out and an adequate length was displayed. Critical view of Calot's triangle was obtained and the structures were clearly identified.  After this was satisfactorily done, the cystic duct was doubly clipped on proximal side and single clip on distal side and divided. The cystic artery was also singly clipped on both sides and divided.   The gallbladder was then gently dissected off from the liver bed  using electrocautery and achieving hemostasis. as the dissection proceeded  upwards towards the fundus. The gallbladder was thus removed from its bed.  Using a 8mm Endobag through the left upper quadrant port, the gallbladder  was removed intact.    Liver bed was inspected. No bleeding or bile leak was noted.     The left upper quadrant port fascia was closed with 0 Vicryl with a figure of 8 stitch using a stortz device. All incisions had skin approximated with subcuticular 4-0 Vicryl   with Dermabond to the skin. Marcaine  was infiltrated into each port site.    SPECIMEN:  Gallbladder.    COUNTS: Correct at the completion of surgery.    Nancee Liter, MD

## 2023-02-03 NOTE — Care Coordination-Inpatient (Signed)
Transition of Care Plan:  Medication management continues  OR today for cholecystectomy  Home with family when medically stable  Patient will need financial assistance for any discharge medications  Family to transport at discharge

## 2023-02-03 NOTE — Discharge Instructions (Addendum)
POST-OPERATIVE INSTRUCTIONS  OUTPATIENT SURGERY Robotic CHOLECYSTECTOMY    Today you underwent a robotic cholecystectomy which is usually well tolerated. However, below is a list of instructions which are important for you to follow. If you have any questions, please call your surgeon's office.    1 EATING: Please eat lightly when you go home today for the first 24 hours. You may resume your regular diet after that.    2. EXERCISE: Limit your exercise for the first week, although stairs or other walking is fine. No strenuous activity (No lifting >10-15lbs) for one month.    3. DRESSING: You may shower in 1 day, unless otherwise instructed by your surgeon.  No pools, baths, or hot tubs for 2 weeks.    4. NO LIFTING: No lifting >10lbs for 4 weeks from day of surgery    5. DRIVING: No driving while taking prescription pain medicine, and until post-operative discomfort resolves. Usually you may begin driving within 1-2 weeks.    6. PAIN: A prescription for pain has been given to you or your family. Please use it as prescribed and if this is inadequate, please call your surgeon's office. In some cases, Tylenol or Advil may be adequate; and in that case, you will not need to fill the prescription. Please call for any refills during office hours. Pain medication may cause constipation - Colace or Milk of Magnesia may be used as needed.  May take Tylenol 1000mg  every 6 hours as needed for pain  May take Ibuprofen 600-800mg  every 6 hours as needed for pain      7. WOUND: If you notice any increased redness, swelling, pain or fever, please call the office. If this is noticed at a time after normal office hours, please call our answering service.    8. APPOINTMENT: You may already have a follow up appointment.          If not your surgeon would like to see you in his/her office in 14 days, call to make your follow up    If this appointment has not been made for you prior to your departure from Outpatient Surgery, please call  your surgeon's office to schedule your appointment. If you have any questions or concerns, please do not hesitate to call your surgeon or any other staff member who will be able to assist you.      [facility]  [address]   [address]  [place], [place] [address]  [phone]   ACUTE DIAGNOSES:  Abdominal pain, epigastric [R10.13]  Abdominal pain [R10.9]  AKI (acute kidney injury) (HCC) [N17.9]  Nausea and vomiting, unspecified vomiting type [R11.2]    CHRONIC MEDICAL DIAGNOSES:  @RPROB @    DISCHARGE MEDICATIONS:   @DISCHARGEMEDS @    It is important that you take the medication exactly as they are prescribed.   Keep your medication in the bottles provided by the pharmacist and keep a list of the medication names, dosages, and times to be taken in your wallet.   Do not take other medications without consulting your doctor.       DIET:  low fat, low cholesterol diet    ACTIVITY: activity as tolerated    ADDITIONAL INFORMATION: If you experience any of the following symptoms then please call your primary care physician or return to the emergency room if you cannot get hold of your doctor: Fever, chills, nausea, vomiting, diarrhea, change in mentation, falling, bleeding, shortness of breath.    FOLLOW UP CARE:  Dr. @PCP @  you are to call and set up an appointment to see them in 5 days.    Follow-up  Follow up with Dr Lauretta Grill in 2 weeks [phone]       Information obtained by :  I understand that if any problems occur once I am at home I am to contact my physician.    I understand and acknowledge receipt of the instructions indicated above.                                                                                                                                           Physician's or R.N.'s Signature                                                                  Date/Time                                                                                                                                               Patient or Energy manager                                                          Date/Time

## 2023-02-03 NOTE — Progress Notes (Signed)
Cholecystectomy done  No issues during surgery  OK to discharge home from a surgical perspective when stable from medicine  Follow up in office in 2 weeks  Low fat diet for 2 weeks

## 2023-02-04 LAB — POCT GLUCOSE
POC Glucose: 273 mg/dL — ABNORMAL HIGH (ref 65–117)
POC Glucose: 232 mg/dL — ABNORMAL HIGH (ref 65–117)

## 2023-02-04 MED ORDER — OXYCODONE HCL 5 MG PO TABS
5 | ORAL_TABLET | ORAL | 0 refills | Status: AC | PRN
Start: 2023-02-04 — End: 2023-02-07

## 2023-02-04 MED FILL — METOCLOPRAMIDE HCL 5 MG/ML IJ SOLN: 5 MG/ML | INTRAMUSCULAR | Qty: 2

## 2023-02-04 MED FILL — AMLODIPINE BESYLATE 5 MG PO TABS: 5 MG | ORAL | Qty: 2

## 2023-02-04 MED FILL — METOPROLOL SUCCINATE ER 50 MG PO TB24: 50 MG | ORAL | Qty: 2

## 2023-02-04 MED FILL — LORAZEPAM 0.5 MG PO TABS: 0.5 MG | ORAL | Qty: 1

## 2023-02-04 MED FILL — INSULIN LISPRO 100 UNIT/ML IJ SOLN: 100 UNIT/ML | INTRAMUSCULAR | Qty: 2

## 2023-02-04 MED FILL — HUMULIN N 100 UNIT/ML SC SUSP: 100 UNIT/ML | SUBCUTANEOUS | Qty: 7

## 2023-02-04 MED FILL — PANTOPRAZOLE SODIUM 40 MG IV SOLR: 40 MG | INTRAVENOUS | Qty: 40

## 2023-02-04 MED FILL — OXYCODONE HCL 5 MG PO TABS: 5 MG | ORAL | Qty: 2

## 2023-02-04 MED FILL — FERROUS SULFATE 325 (65 FE) MG PO TABS: 325 (65 Fe) MG | ORAL | Qty: 1

## 2023-02-04 MED FILL — MORPHINE SULFATE 2 MG/ML IJ SOLN: 2 mg/mL | INTRAMUSCULAR | Qty: 1

## 2023-02-04 NOTE — Plan of Care (Signed)
Problem: Pain  Goal: Verbalizes/displays adequate comfort level or baseline comfort level  Outcome: Progressing  Flowsheets (Taken 02/04/2023 0330)  Verbalizes/displays adequate comfort level or baseline comfort level:   Assess pain using appropriate pain scale   Administer analgesics based on type and severity of pain and evaluate response   Encourage patient to monitor pain and request assistance     Problem: Safety - Adult  Goal: Free from fall injury  Outcome: Progressing  Flowsheets (Taken 02/04/2023 0330)  Free From Fall Injury:   Instruct family/caregiver on patient safety   Based on caregiver fall risk screen, instruct family/caregiver to ask for assistance with transferring infant if caregiver noted to have fall risk factors     Problem: Chronic Conditions and Co-morbidities  Goal: Patient's chronic conditions and co-morbidity symptoms are monitored and maintained or improved  Outcome: Progressing  Flowsheets (Taken 02/04/2023 0330)  Care Plan - Patient's Chronic Conditions and Co-Morbidity Symptoms are Monitored and Maintained or Improved:   Monitor and assess patient's chronic conditions and comorbid symptoms for stability, deterioration, or improvement   Collaborate with multidisciplinary team to address chronic and comorbid conditions and prevent exacerbation or deterioration     Problem: Discharge Planning  Goal: Discharge to home or other facility with appropriate resources  Outcome: Progressing  Flowsheets (Taken 02/04/2023 0330)  Discharge to home or other facility with appropriate resources: Identify barriers to discharge with patient and caregiver

## 2023-02-04 NOTE — Plan of Care (Signed)
Problem: Discharge Planning  Goal: Discharge to home or other facility with appropriate resources  02/04/2023 1029 by Theadora Rama, RN  Outcome: HH/HSPC Progressing  02/04/2023 0330 by Wilhemina Bonito, RN  Outcome: Progressing  Flowsheets (Taken 02/04/2023 0330)  Discharge to home or other facility with appropriate resources: Identify barriers to discharge with patient and caregiver     Problem: Pain  Goal: Verbalizes/displays adequate comfort level or baseline comfort level  02/04/2023 1029 by Theadora Rama, RN  Outcome: HH/HSPC Progressing  02/04/2023 0330 by Wilhemina Bonito, RN  Outcome: Progressing  Flowsheets (Taken 02/04/2023 0330)  Verbalizes/displays adequate comfort level or baseline comfort level:   Assess pain using appropriate pain scale   Administer analgesics based on type and severity of pain and evaluate response   Encourage patient to monitor pain and request assistance     Problem: Safety - Adult  Goal: Free from fall injury  02/04/2023 1029 by Theadora Rama, RN  Outcome: HH/HSPC Progressing  02/04/2023 0330 by Wilhemina Bonito, RN  Outcome: Progressing  Flowsheets (Taken 02/04/2023 0330)  Free From Fall Injury:   Instruct family/caregiver on patient safety   Based on caregiver fall risk screen, instruct family/caregiver to ask for assistance with transferring infant if caregiver noted to have fall risk factors     Problem: Chronic Conditions and Co-morbidities  Goal: Patient's chronic conditions and co-morbidity symptoms are monitored and maintained or improved  02/04/2023 1029 by Theadora Rama, RN  Outcome: HH/HSPC Progressing  02/04/2023 0330 by Wilhemina Bonito, RN  Outcome: Progressing  Flowsheets (Taken 02/04/2023 0330)  Care Plan - Patient's Chronic Conditions and Co-Morbidity Symptoms are Monitored and Maintained or Improved:   Monitor and assess patient's chronic conditions and comorbid symptoms for stability, deterioration, or improvement   Collaborate with multidisciplinary team to address chronic and  comorbid conditions and prevent exacerbation or deterioration

## 2023-02-04 NOTE — Care Coordination-Inpatient (Addendum)
02/04/2023 8:30 AM   Care Management Progress Note    Admitting diagnosis:  Abdominal pain, epigastric [R10.13]  Abdominal pain [R10.9]  AKI (acute kidney injury) (HCC) [N17.9]  Nausea and vomiting, unspecified vomiting type [R11.2]    Admit Date:  02/01/2023  4:55 PM    RUR: 17%   Risk Level: [] Low [x] Moderate [] High    Transition of care plan:  Discharge today  General Surgery consulted  PT and OT consulted: No not indicated   Anticipated discharge plan: Home with family  Pt's only new discharge medication is Roxicodone, Care Fund unable to cover costs for narcotics. Pt will need to pay out of pocket for medication  CM called and scheduled pt a PCP follow up for on 4/29 at 3:15PM   CM emailed pt's discharge summary via safemail to [email or web address]  Outpatient follow-up.  Discharge transport: Pt's family        02/04/23 0839   Discharge Planning   Patient expects to be discharged to: [facility]

## 2023-02-04 NOTE — Progress Notes (Signed)
[facility]  [address] [place], [place] [address]  [phone]      Hospitalist Progress Note      NAME: Melinda [name]   DOB:  01-Nov-1980  MRM:  [id number]    Date of service: 02/04/2023  7:52 AM       Assessment and Plan:   Abdominal pain/ abnormal HIDA scan: Gallbladder ejection fraction is diminished. EGD on 4/9 and colonoscopy 9/10 did not show any acute findings. S/p lap cholecystectomy 4/25. Advised low fat diet for 2 weeks and FU with surgery      2.  AKI. Improving with IVF.      3.  Hypertension. Cont metoprolol and IV hydralazine as needed  -Holding lisinopril and spironolactone due to AKI     4.  Type 2 diabetes-A1c 8.8. Continue Lantus          Subjective:     Chief Complaint:: Patient was seen and examined as a follow up for abdominal pain.  Chart was reviewed. c/o abdominal pain     ROS:  (bold if positive, if negative)    Tolerating PT  Tolerating Diet        Objective:     Last 24hrs VS reviewed since prior progress note. Most recent are:    Vitals:    02/04/23 0739   BP: (!) 163/89   Pulse: 94   Resp: 16   Temp: 99.7 F (37.6 C)   SpO2: 97%     SpO2 Readings from Last 6 Encounters:   02/04/23 97%   01/20/23 98%   06/29/22 100%   05/15/22 98%   02/16/22 99%        No intake or output data in the 24 hours ending 02/04/23 2130     Physical Exam:    Gen:  Well-developed, well-nourished, in no acute distress  HEENT:  Pink conjunctivae, PERRL, hearing intact to voice, moist mucous membranes  Neck:  Supple, without masses, thyroid non-tender  Resp:  No accessory muscle use, clear breath sounds without wheezes rales or rhonchi  Card:  No murmurs, normal S1, S2 without thrills, bruits or peripheral edema  Abd:  Soft, non-tender, non-distended, normoactive bowel sounds are present, no palpable organomegaly and no detectable hernias  Lymph:  No cervical or inguinal adenopathy  Musc:  No cyanosis or clubbing  Skin:  No rashes or ulcers, skin turgor is good  Neuro:   Cranial nerves are grossly intact, no focal motor weakness, follows commands appropriately  Psych:  Good insight, oriented to person, place and time, alert  __________________________________________________________________  Medications Reviewed: (see below)  Medications:     Current Facility-Administered Medications   Medication Dose Route Frequency    metoprolol succinate (TOPROL XL) extended release tablet 100 mg  100 mg Oral Daily    morphine (PF) injection 2 mg  2 mg IntraVENous Q3H PRN    acetaminophen (TYLENOL) tablet 1,000 mg  1,000 mg Oral Q6H PRN    oxyCODONE (ROXICODONE) immediate release tablet 5 mg  5 mg Oral Q4H PRN    oxyCODONE (ROXICODONE) immediate release tablet 10 mg  10 mg Oral Q4H PRN    metoclopramide (REGLAN) injection 5 mg  5 mg IntraVENous Q6H    prochlorperazine (COMPAZINE) injection 10 mg  10 mg IntraVENous Q6H PRN    amLODIPine (NORVASC) tablet 10 mg  10 mg Oral Daily    ferrous sulfate (IRON 325) tablet 325 mg  325 mg Oral QAM AC  glucose chewable tablet 16 g  4 tablet Oral PRN    dextrose bolus 10% 125 mL  125 mL IntraVENous PRN    Or    dextrose bolus 10% 250 mL  250 mL IntraVENous PRN    glucagon injection 1 mg  1 mg SubCUTAneous PRN    dextrose 10 % infusion   IntraVENous Continuous PRN    sodium chloride flush 0.9 % injection 5-40 mL  5-40 mL IntraVENous 2 times per day    sodium chloride flush 0.9 % injection 5-40 mL  5-40 mL IntraVENous PRN    0.9 % sodium chloride infusion   IntraVENous PRN    potassium chloride (KLOR-CON) extended release tablet 40 mEq  40 mEq Oral PRN    Or    potassium bicarb-citric acid (EFFER-K) effervescent tablet 40 mEq  40 mEq Oral PRN    Or    potassium chloride 10 mEq/100 mL IVPB (Peripheral Line)  10 mEq IntraVENous PRN    magnesium sulfate 2000 mg in 50 mL IVPB premix  2,000 mg IntraVENous PRN    enoxaparin (LOVENOX) injection 40 mg  40 mg SubCUTAneous QPM    ondansetron (ZOFRAN-ODT) disintegrating tablet 4 mg  4 mg Oral Q8H PRN    Or    ondansetron  (ZOFRAN) injection 4 mg  4 mg IntraVENous Q6H PRN    polyethylene glycol (GLYCOLAX) packet 17 g  17 g Oral Daily PRN    insulin lispro (HUMALOG) injection vial 0-4 Units  0-4 Units SubCUTAneous TID WC    insulin lispro (HUMALOG) injection vial 0-4 Units  0-4 Units SubCUTAneous Nightly    hydrALAZINE (APRESOLINE) injection 10 mg  10 mg IntraVENous Q6H PRN    pantoprazole (PROTONIX) 40 mg in sodium chloride (PF) 0.9 % 10 mL injection  40 mg IntraVENous Daily    LORazepam (ATIVAN) tablet 0.5 mg  0.5 mg Oral Q4H PRN    insulin NPH (HumuLIN N;NovoLIN N) injection vial 7 Units  7 Units SubCUTAneous BID AC    dicyclomine (BENTYL) tablet 20 mg  20 mg Oral Q6H PRN        Lab Data Reviewed: (see below)  Lab Review:     Recent Labs     02/01/23  1734 02/02/23  0513   WBC 8.9 8.8   HGB 10.4* 9.2*   HCT 31.9* 28.0*   PLT 562* 447*       Recent Labs     02/01/23  1734 02/02/23  0513 02/03/23  0236   NA 138 139 139   K 4.2 3.7 3.9   CL 108 111* 112*   CO2 22 21 21    BUN 24* 19 17   MG 2.2  --   --    ALT 25  --   --        No results found for: "GLUCPOC"  No results for input(s): "PH", "PCO2", "PO2", "HCO3", "FIO2" in the last 72 hours.  No results for input(s): "INR" in the last 72 hours.  @MICRORESULTS @    I have reviewed notes of prior 24hr.    Other pertinent lab:     Total time: -35- minutes. I personally saw and examined the patient during this time period.  Greater than 50% of this time was spent in counseling and coordination of care.    I personally reviewed chart, notes, data and current medications in the medical record.  I have personally examined and treated the patient at bedside during this period.  Care Plan discussed with: Patient, Nursing Staff, and >50% of time spent in counseling and coordination of care    Discussed:  Care Plan    Prophylaxis:  Lovenox    Disposition:  Home w/Family           ___________________________________________________    Attending Physician: Cyndra Numbers, MD

## 2023-02-04 NOTE — Discharge Summary (Signed)
Hospitalist Discharge Summary     Patient ID:    Melinda [name]  [id number]  42 y.o.  1980-11-04    Admit date of service: 02/01/2023    Discharge date of service: 02/04/2023    Admission Diagnoses: Abdominal pain, epigastric [R10.13]  Abdominal pain [R10.9]  AKI (acute kidney injury) (HCC) [N17.9]  Nausea and vomiting, unspecified vomiting type [R11.2]    Chronic Diagnoses:      Discharge Medications:   Current Discharge Medication List        START taking these medications    Details   oxyCODONE (ROXICODONE) 5 MG immediate release tablet Take 1 tablet by mouth every 4 hours as needed for Pain for up to 3 days. Max Daily Amount: 30 mg  Qty: 15 tablet, Refills: 0    Comments: Reduce doses taken as pain becomes manageable  Associated Diagnoses: Right upper quadrant abdominal pain           CONTINUE these medications which have NOT CHANGED    Details   naloxone (NARCAN) 4 MG/0.1ML LIQD nasal spray 1 spray by Nasal route as needed for Opioid Reversal  Qty: 2 each, Refills: 0      metoprolol succinate (TOPROL XL) 50 MG extended release tablet Take 1 tablet by mouth daily  Qty: 30 tablet, Refills: 0      ondansetron (ZOFRAN-ODT) 4 MG disintegrating tablet Take 1 tablet by mouth every 8 hours as needed for Nausea or Vomiting  Qty: 30 tablet, Refills: 0      polyethylene glycol (GLYCOLAX) 17 g packet Take 1 packet by mouth daily as needed for Constipation  Qty: 30 packet, Refills: 0      ferrous sulfate (IRON 325) 325 (65 Fe) MG tablet Take 1 tablet by mouth every morning (before breakfast)  Qty: 90 tablet, Refills: 0      lisinopril (PRINIVIL;ZESTRIL) 40 MG tablet Take 1 tablet by mouth daily      amLODIPine (NORVASC) 10 MG tablet Take 1 tablet by mouth daily      insulin 70-30 (NOVOLIN 70/30) (70-30) 100 UNIT per ML injection vial Inject 15 Units into the skin 2 times daily           STOP taking these medications       spironolactone (ALDACTONE) 25 MG tablet Comments:   Reason for Stopping:               Follow up  Care:    1. No follow-up provider specified. in 1-2 weeks  2. surgery    Diet:  low fat, low cholesterol diet    Disposition:  Home.    Advanced Directive:    Discharge Exam:  See today's note.    CONSULTATIONS: general surgery    Significant Diagnostic Studies:   Recent Labs     02/01/23  1734 02/02/23  0513   WBC 8.9 8.8   HGB 10.4* 9.2*   HCT 31.9* 28.0*   PLT 562* 447*     Recent Labs     02/01/23  1734 02/02/23  0513 02/03/23  0236   NA 138 139 139   K 4.2 3.7 3.9   CL 108 111* 112*   CO2 22 21 21    BUN 24* 19 17   MG 2.2  --   --      Recent Labs     02/01/23  1734   ALT 25   GLOB 4.6*     No results for input(s): "INR", "APTT"  in the last 72 hours.    Invalid input(s): "PTP"   No results for input(s): "TIBC", "FERR" in the last 72 hours.    Invalid input(s): "FE", "PSAT"   No results for input(s): "PH", "PCO2", "PO2" in the last 72 hours.  No results for input(s): "CPK", "CKMB", "TROPONINI" in the last 72 hours.  No results found for: "GLUCPOC"          HOSPITAL COURSE & TREATMENT RENDERED:     Abdominal pain/ abnormal HIDA scan: Gallbladder ejection fraction is diminished. EGD on 4/9 and colonoscopy 9/10 did not show any acute findings. S/p lap cholecystectomy 4/25. Advised low fat diet for 2 weeks and FU with surgery      2.  AKI. Improving with IVF.      3.  Hypertension. Cont metoprolol, lisinopril and spironolactone due to AKI     4.  Type 2 diabetes-A1c 8.8. Continue Lantus       Discharged in improved condition.    Spent 35 minutes    Signed:  Otho Darner, MD  02/04/2023  7:57 AM

## 2023-02-04 NOTE — Progress Notes (Signed)
Nurse handed patient a copy of discharge instructions which have been read and explained to patient. New medications were read and explained to patient, patient verbalized understanding. Patient aware that prescriptions have been electronically sent to their pharmacy. Opportunity for questions and clarification offered. Removed patient's IV access with no complications. Vital signs stable. Patient sent with all belongings.

## 2023-02-07 LAB — CULTURE, BLOOD 1: Culture: NO GROWTH

## 2023-02-07 LAB — CULTURE, BLOOD 2: Culture: NO GROWTH

## 2023-02-10 ENCOUNTER — Emergency Department: Admit: 2023-02-10 | Payer: MEDICAID | Primary: Adult Health

## 2023-02-10 ENCOUNTER — Inpatient Hospital Stay
Admission: EM | Admit: 2023-02-10 | Discharge: 2023-02-14 | Disposition: A | Discharge status: Home / Self Care | Payer: MEDICAID | Admitting: Internal Medicine

## 2023-02-10 DIAGNOSIS — R1013 Epigastric pain: Secondary | ICD-10-CM

## 2023-02-10 DIAGNOSIS — E1043 Type 1 diabetes mellitus with diabetic autonomic (poly)neuropathy: Principal | ICD-10-CM

## 2023-02-10 LAB — CBC WITH AUTO DIFFERENTIAL
Basophils %: 1 % (ref 0–1)
Basophils Absolute: 0 10*3/uL (ref 0.0–0.1)
Eosinophils %: 0 % (ref 0–7)
Eosinophils Absolute: 0 10*3/uL (ref 0.0–0.4)
Hematocrit: 30.7 % — ABNORMAL LOW (ref 35.0–47.0)
Hemoglobin: 10.2 g/dL — ABNORMAL LOW (ref 11.5–16.0)
Immature Granulocytes %: 0 % (ref 0.0–0.5)
Immature Granulocytes Absolute: 0 10*3/uL (ref 0.00–0.04)
Lymphocytes %: 14 % (ref 12–49)
Lymphocytes Absolute: 1.1 10*3/uL (ref 0.8–3.5)
MCH: 25.6 PG — ABNORMAL LOW (ref 26.0–34.0)
MCHC: 33.2 g/dL (ref 30.0–36.5)
MCV: 76.9 FL — ABNORMAL LOW (ref 80.0–99.0)
MPV: 9.6 FL (ref 8.9–12.9)
Monocytes %: 4 % — ABNORMAL LOW (ref 5–13)
Monocytes Absolute: 0.3 10*3/uL (ref 0.0–1.0)
Neutrophils %: 81 % — ABNORMAL HIGH (ref 32–75)
Neutrophils Absolute: 6.6 10*3/uL (ref 1.8–8.0)
Nucleated RBCs: 0 PER 100 WBC
Platelets: 448 10*3/uL — ABNORMAL HIGH (ref 150–400)
RBC: 3.99 M/uL (ref 3.80–5.20)
RDW: 19.9 % — ABNORMAL HIGH (ref 11.5–14.5)
WBC: 8.1 10*3/uL (ref 3.6–11.0)
nRBC: 0 10*3/uL (ref 0.00–0.01)

## 2023-02-10 LAB — TROPONIN: Troponin, High Sensitivity: 11 ng/L (ref 0–51)

## 2023-02-10 LAB — EKG 12-LEAD
Atrial Rate: 105 {beats}/min
P Axis: 59 degrees
P-R Interval: 136 ms
Q-T Interval: 382 ms
QRS Duration: 82 ms
QTc Calculation (Bazett): 504 ms
R Axis: -84 degrees
T Axis: 67 degrees
Ventricular Rate: 105 {beats}/min

## 2023-02-10 LAB — COMPREHENSIVE METABOLIC PANEL
ALT: 33 U/L (ref 12–78)
AST: 25 U/L (ref 15–37)
Albumin/Globulin Ratio: 0.5 — ABNORMAL LOW (ref 1.1–2.2)
Albumin: 2 g/dL — ABNORMAL LOW (ref 3.5–5.0)
Alk Phosphatase: 110 U/L (ref 45–117)
Anion Gap: 8 mmol/L (ref 5–15)
BUN/Creatinine Ratio: 11 — ABNORMAL LOW (ref 12–20)
BUN: 16 MG/DL (ref 6–20)
CO2: 22 mmol/L (ref 21–32)
Calcium: 7.7 MG/DL — ABNORMAL LOW (ref 8.5–10.1)
Chloride: 109 mmol/L — ABNORMAL HIGH (ref 97–108)
Creatinine: 1.42 MG/DL — ABNORMAL HIGH (ref 0.55–1.02)
Est, Glom Filt Rate: 48 mL/min/{1.73_m2} — ABNORMAL LOW (ref >60)
Globulin: 4 g/dL (ref 2.0–4.0)
Glucose: 338 mg/dL — ABNORMAL HIGH (ref 65–100)
Potassium: 3.2 mmol/L — ABNORMAL LOW (ref 3.5–5.1)
Sodium: 139 mmol/L (ref 136–145)
Total Bilirubin: 0.2 MG/DL (ref 0.2–1.0)
Total Protein: 6 g/dL — ABNORMAL LOW (ref 6.4–8.2)

## 2023-02-10 LAB — URINALYSIS WITH REFLEX TO CULTURE
Bilirubin, Urine: NEGATIVE
Glucose, Ur: >1000 mg/dL — AB
Leukocyte Esterase, Urine: NEGATIVE
Nitrite, Urine: NEGATIVE
Protein, UA: >300 mg/dL — AB
Specific Gravity, UA: 1.03 (ref 1.003–1.030)
Urobilinogen, Urine: 0.2 EU/dL (ref 0.2–1.0)
pH, Urine: 8 (ref 5.0–8.0)

## 2023-02-10 LAB — LIPASE: Lipase: 29 U/L (ref 13–75)

## 2023-02-10 LAB — EXTRA TUBES HOLD

## 2023-02-10 LAB — HCG, SERUM, QUALITATIVE: Preg, Serum: NEGATIVE

## 2023-02-10 LAB — MAGNESIUM: Magnesium: 2 mg/dL (ref 1.6–2.4)

## 2023-02-10 LAB — LACTIC ACID: Lactic Acid, Plasma: 1 MMOL/L (ref 0.4–2.0)

## 2023-02-10 LAB — POC PREGNANCY UR-QUAL: Preg Test, Ur: NEGATIVE

## 2023-02-10 MED ORDER — ONDANSETRON HCL 4 MG/2ML IJ SOLN
42 MG/2ML | Freq: Four times a day (QID) | INTRAMUSCULAR | Status: DC | PRN
Start: 2023-02-10 — End: 2023-02-14
  Administered 2023-02-10 – 2023-02-13 (×6): 4 mg via INTRAVENOUS

## 2023-02-10 MED ORDER — SODIUM CHLORIDE 0.9 % IV BOLUS
0.9 | Freq: Once | INTRAVENOUS | Status: AC
Start: 2023-02-10 — End: 2023-02-10
  Administered 2023-02-10: 16:00:00 1000 mL via INTRAVENOUS

## 2023-02-10 MED ORDER — DROPERIDOL 2.5 MG/ML IJ SOLN
2.5 | Freq: Once | INTRAMUSCULAR | Status: AC
Start: 2023-02-10 — End: 2023-02-10
  Administered 2023-02-10: 22:00:00 0.625 mg via INTRAVENOUS

## 2023-02-10 MED ORDER — HYDROMORPHONE HCL PF 1 MG/ML IJ SOLN
1 | INTRAMUSCULAR | Status: AC
Start: 2023-02-10 — End: 2023-02-10
  Administered 2023-02-10: 17:00:00 1 mg via INTRAVENOUS

## 2023-02-10 MED ORDER — POTASSIUM CHLORIDE 10 MEQ/100ML IV SOLN
10 | Freq: Once | INTRAVENOUS | Status: AC
Start: 2023-02-10 — End: 2023-02-10
  Administered 2023-02-10: 19:00:00 10 meq via INTRAVENOUS

## 2023-02-10 MED ORDER — LABETALOL HCL 5 MG/ML IV SOLN
5 | Freq: Once | INTRAVENOUS | Status: AC
Start: 2023-02-10 — End: 2023-02-10
  Administered 2023-02-10: 22:00:00 10 mg via INTRAVENOUS

## 2023-02-10 MED ORDER — HYDROMORPHONE 0.5MG/0.5ML IJ SOLN
1 | Status: AC
Start: 2023-02-10 — End: 2023-02-10
  Administered 2023-02-10: 17:00:00 0.5 mg via INTRAVENOUS

## 2023-02-10 MED ORDER — IOPAMIDOL 76 % IV SOLN
76 | Freq: Once | INTRAVENOUS | Status: AC | PRN
Start: 2023-02-10 — End: 2023-02-10
  Administered 2023-02-10: 18:00:00 100 mL via INTRAVENOUS

## 2023-02-10 MED ORDER — HYDROMORPHONE 0.5MG/0.5ML IJ SOLN
1 | Status: AC
Start: 2023-02-10 — End: 2023-02-10
  Administered 2023-02-10: 19:00:00 0.5 mg via INTRAVENOUS

## 2023-02-10 MED ORDER — DROPERIDOL 2.5 MG/ML IJ SOLN
2.5 | Freq: Once | INTRAMUSCULAR | Status: AC
Start: 2023-02-10 — End: 2023-02-10
  Administered 2023-02-10: 19:00:00 0.625 mg via INTRAVENOUS

## 2023-02-10 MED ORDER — MORPHINE SULFATE (PF) 4 MG/ML IJ SOLN
4 | INTRAMUSCULAR | Status: AC
Start: 2023-02-10 — End: 2023-02-10
  Administered 2023-02-10: 16:00:00 4 mg via INTRAVENOUS

## 2023-02-10 MED FILL — ONDANSETRON HCL 4 MG/2ML IJ SOLN: 4 MG/2ML | INTRAMUSCULAR | Qty: 2

## 2023-02-10 MED FILL — SODIUM CHLORIDE 0.9 % IV SOLN: 0.9 % | INTRAVENOUS | Qty: 1000

## 2023-02-10 MED FILL — HYDROMORPHONE HCL 1 MG/ML IJ SOLN: 1 MG/ML | INTRAMUSCULAR | Qty: 0.5

## 2023-02-10 MED FILL — HYDROMORPHONE HCL 1 MG/ML IJ SOLN: 1 MG/ML | INTRAMUSCULAR | Qty: 1

## 2023-02-10 MED FILL — ISOVUE-370 76 % IV SOLN: 76 % | INTRAVENOUS | Qty: 100

## 2023-02-10 MED FILL — POTASSIUM CHLORIDE 10 MEQ/100ML IV SOLN: 10 MEQ/0ML | INTRAVENOUS | Qty: 100

## 2023-02-10 MED FILL — LABETALOL HCL 5 MG/ML IV SOLN: 5 MG/ML | INTRAVENOUS | Qty: 4

## 2023-02-10 MED FILL — DROPERIDOL 2.5 MG/ML IJ SOLN: 2.5 MG/ML | INTRAMUSCULAR | Qty: 2

## 2023-02-10 MED FILL — MORPHINE SULFATE 4 MG/ML IJ SOLN: 4 mg/mL | INTRAMUSCULAR | Qty: 1

## 2023-02-10 NOTE — Consults (Signed)
Session ID: [id number]  Language: Spanish  Interpreter ID: #[id number]  Interpreter Name: Blossom Hoops

## 2023-02-10 NOTE — H&P (Addendum)
Hospitalist Admission Note      NAME:  Melinda [name]   DOB:  1981/05/19   MRN:  [id number]     Date/Time:  02/10/2023 6:10 PM    Patient PCP: Orlean Bradford, APRN - NP    ________________________________________________________________________    Given the patient's current clinical presentation, I have a high level of concern for decompensation if discharged from the emergency department.  Complex decision making was performed, which includes reviewing the patient's available past medical records, laboratory results, and x-ray films.       My assessment of this patient's clinical condition and my plan of care is as follows.    Assessment / Plan:  Patient is a 42 year old female with history of hypertension, diabetes, GERD, hyperlipidemia who recently had a cholecystectomy done comes back with recurrent abdominal pain and postcholecystectomy abscess formation.  Patient will be treated with IV antibiotics, surgery consult, IR consult for drainage placement.    1.  Abdominal pain  Probably related to abscess postcholecystectomy  Surgery consult, IR consult  Pain control and IV antibiotics  Lactic acid is normal    2.  Uncontrolled hypertension  Start IV hydralazine 10 mg every 6 hours as needed    3.  Hypokalemia  Potassium is 3.2 today.  Replace potassium    4.  Diabetes   Patient takes insulin 70/30 15 units twice daily at home.    IV fluids and start sliding scale insulin.  Patient is n.p.o. at this time.    5.  AKI  Creatinine is 1.42.  Avoid nephrotoxic medications.  Start IV fluids  She is taking lisinopril and spironolactone at home.      I have personally reviewed the radiographs, laboratory data in Epic and decisions and statements above are based partially on this personal interpretation.    Code Status: Full Code  DVT Prophylaxis: SCD's  GI Prophylaxis: PPI       Subjective:   CHIEF COMPLAINT: "Abdominal pain"    HISTORY OF PRESENT ILLNESS:     Melinda [name] is a 42 y.o.  Hispanic female with PMHx  hypertension, hyperlipidemia, diabetes comes to the hospital with chief complaint of abdominal pain.  Patient was recently admitted to this hospital and had a cholecystectomy done on 02/03/2023.  As per the patient since the surgery she continues to have abdominal pain nausea and vomiting.  The abdominal pain is mostly located in the mid abdominal area.  She could not eat or drink anything.  She is complaining of sharp pain almost 8 out of 10 in intensity.  The pain is intermittent and radiates to her back.  She was prescribed oxycodone but it is not helping her symptoms.  She denies any fever or chills.  No diarrhea.  She is passing gas.  She came to the ER and her blood pressure was 185/111, pulse 112, respiratory rate 18, temperature 98.7.  Blood work showed a sodium 132, potassium 3.2, creatinine 1.42, WBC 8.1, hemoglobin 10.2, platelet count 448.  CT scan of abdomen and pelvis showed a 3.6 x 2.6 x 3.9 cm gallbladder fossa gas and fluid containing collection.  General surgery was consulted by ER.          Available records were reviewed at the time of H&P.        Past Medical History:   Diagnosis Date    High blood pressure     High cholesterol     Type 2 diabetes mellitus (HCC)  Controled      Past Surgical History:   Procedure Laterality Date    CAPSULE ENDOSCOPY N/A 01/20/2023    ESOPHAGEAL CAPSULE ENDOSCOPY remove at 1624PM performed by Glyn Ade, MD at [facility] ENDOSCOPY    CHOLECYSTECTOMY, LAPAROSCOPIC N/A 02/03/2023    ROBOTIC LAPAROSCOPIC CHOLECYSTECTOMY with Indocyanine green performed by Verdis Frederickson, MD at [facility] MAIN OR    COLONOSCOPY N/A 01/19/2023    COLONOSCOPY DIAGNOSTIC performed by Glyn Ade, MD at [facility] ENDOSCOPY    OTHER SURGICAL HISTORY Left     Rentia attachment    TUBAL LIGATION Bilateral     UPPER GASTROINTESTINAL ENDOSCOPY N/A 01/17/2023    ESOPHAGOGASTRODUODENOSCOPY performed by Glyn Ade, MD at [facility] ENDOSCOPY    UPPER GASTROINTESTINAL ENDOSCOPY N/A 01/17/2023     ESOPHAGOGASTRODUODENOSCOPY BIOPSY performed by Glyn Ade, MD at [facility] ENDOSCOPY    UPPER GASTROINTESTINAL ENDOSCOPY N/A 01/18/2023    ESOPHAGOGASTRODUODENOSCOPY performed by Glyn Ade, MD at [facility] ENDOSCOPY     Social History     Tobacco Use    Smoking status: Never    Smokeless tobacco: Never   Substance Use Topics    Alcohol use: Never      No family history on file.     No Known Allergies     Prior to Admission medications    Medication Sig Start Date End Date Taking? Authorizing Provider   naloxone [facility]) 4 MG/0.1ML LIQD nasal spray 1 spray by Nasal route as needed for Opioid Reversal 01/20/23   Horald Chestnut, DO   metoprolol succinate (TOPROL XL) 50 MG extended release tablet Take 1 tablet by mouth daily 01/21/23   Horald Chestnut, DO   ondansetron (ZOFRAN-ODT) 4 MG disintegrating tablet Take 1 tablet by mouth every 8 hours as needed for Nausea or Vomiting 01/20/23   Horald Chestnut, DO   polyethylene glycol (GLYCOLAX) 17 g packet Take 1 packet by mouth daily as needed for Constipation 01/20/23 02/19/23  Horald Chestnut, DO   ferrous sulfate (IRON 325) 325 (65 Fe) MG tablet Take 1 tablet by mouth every morning (before breakfast) 01/20/23   Horald Chestnut, DO   lisinopril (PRINIVIL;ZESTRIL) 40 MG tablet Take 1 tablet by mouth daily    [provider]   amLODIPine (NORVASC) 10 MG tablet Take 1 tablet by mouth daily    [provider]   insulin 70-30 (NOVOLIN 70/30) (70-30) 100 UNIT per ML injection vial Inject 15 Units into the skin 2 times daily 07/10/21   Automatic Reconciliation, Ar       REVIEW OF SYSTEMS:  See HPI for details  General:  negative for fever, chills, sweats, weakness, weight loss  Eyes: negative for blurred vision, eye pain, loss of vision, diplopia  Ear Nose and Throat: negative for rhinorrhea, pharyngitis, otalgia, tinnitus, speech or swallowing difficulties  Respiratory:  negative for pleuritic pain, cough, sputum production, wheezing, SOB, DOE  Cardiology:  negative for chest pain,  palpitations, orthopnea, PND, edema, syncope   Gastrointestinal: Positive for abdominal pain, nausea and vomiting  Genitourinary: negative for frequency, urgency, dysuria, hematuria, incontinence  Muskuloskeletal : negative for arthralgia, myalgia  Hematology: negative for easy bruising, bleeding, lymphadenopathy  Dermatological: negative for rash, ulceration, mole change, new lesion  Endocrine: negative for hot flashes or polydipsia  Neurological: negative for headache, dizziness, confusion, focal weakness, paresthesia, memory loss, gait disturbance  Psychological: negative for anxiety, depression, agitation      Objective:   VITALS:    Vitals:    02/10/23 1704  BP: (!) 153/99   Pulse: 95   Resp:    Temp:    SpO2:      PHYSICAL EXAM:    Physical Exam:    Gen: Well-developed, well-nourished, in no acute distress  HEENT:  Pink conjunctivae, PERRL, hearing intact to voice, moist mucous membranes  Neck: Supple, without masses, thyroid non-tender  Resp: No accessory muscle use, clear breath sounds without wheezes rales or rhonchi  Card: No murmurs, normal S1, S2 without thrills, bruits or peripheral edema  Abd:  Soft, tender in the epigastric area, non-distended, normoactive bowel sounds are present, no palpable organomegaly and no detectable hernias  Lymph:  No cervical or inguinal adenopathy  Musc: No cyanosis or clubbing  Skin: No rashes or ulcers, skin turgor is good  Neuro:  Cranial nerves are grossly intact, no focal motor weakness, follows commands appropriately  Psych:  Good insight, oriented to person, place and time, alert          _______________________________________________________________________  Care Plan discussed with:    Comments   Patient x Discussed with patient in room. POC outlined and Questions answered    Family      RN x    Care Manager                    Consultant:  x ED MD   _______________________________________________________________________  Recommended Disposition:   Home with  Family x   HH/PT/OT/RN    SNF/LTC    [facility]    ________________________________________________________________________  TOTAL TIME:  60 Minutes        Comments   >50% of visit spent in counseling and coordination of care  Chart reviewed  Discussion with patient and/or family and questions answered     ________________________________________________________________________  Signed: Nanda Quinton, MD        Procedures: see electronic medical records for all procedures/Xrays/labs and details which were not copied into this note but were reviewed prior to creation of Plan.

## 2023-02-10 NOTE — ED Notes (Signed)
TRANSFER - OUT REPORT:    Verbal report given to [facility] on Keyra Bilbrey Toto-Ambros  being transferred to 314 for transfer of care     Report consisted of patient's Situation, Background, Assessment and   Recommendations(SBAR).     Information from the following report(s) ED Encounter Summary, ED SBAR, [facility], and Recent Results was reviewed with the receiving nurse.    [place] Assessment:                           Lines:   Peripheral IV 02/10/23 Posterior;Right Forearm (Active)       Peripheral IV 02/10/23 Left Antecubital (Active)        Opportunity for questions and clarification was provided.

## 2023-02-10 NOTE — ED Notes (Signed)
ED SIGN OUT NOTE  Care assumed at [facility] 3:33 PM EDT    Patient was signed out to me by Dr. Zoe Lan.     Patient is awaiting general surgery consult. Plan for admission for intractable epigastric abdominal pain, nausea, vomiting, tachycardia, AKI and hypokalemia.     BP (!) 211/104   Pulse (!) 109   Temp 98.7 F (37.1 C) (Oral)   Resp 20   Ht 1.499 m (4\' 11" )   Wt 61.2 kg (135 lb)   LMP 02/03/2023 Comment: Tubal Ligation in 2012 - NEGATIVE PREGNANC TEST 01/14/2023  SpO2 100%   BMI 27.27 kg/m     Labs Reviewed   CBC WITH AUTO DIFFERENTIAL - Abnormal; Notable for the following components:       Result Value    Hemoglobin 10.2 (*)     Hematocrit 30.7 (*)     MCV 76.9 (*)     MCH 25.6 (*)     RDW 19.9 (*)     Platelets 448 (*)     Neutrophils % 81 (*)     Monocytes % 4 (*)     All other components within normal limits   COMPREHENSIVE METABOLIC PANEL - Abnormal; Notable for the following components:    Potassium 3.2 (*)     Chloride 109 (*)     Glucose 338 (*)     Creatinine 1.42 (*)     Bun/Cre Ratio 11 (*)     Est, Glom Filt Rate 48 (*)     Calcium 7.7 (*)     Total Protein 6.0 (*)     Albumin 2.0 (*)     Albumin/Globulin Ratio 0.5 (*)     All other components within normal limits   URINALYSIS WITH REFLEX TO CULTURE - Abnormal; Notable for the following components:    Protein, UA >300 (*)     Glucose, Ur >1000 (*)     Ketones, Urine TRACE (*)     Blood, Urine SMALL (*)     Epithelial Cells UA MANY (*)     BACTERIA, URINE 2+ (*)     All other components within normal limits   EXTRA TUBES HOLD   HCG, SERUM, QUALITATIVE   MAGNESIUM   LIPASE   TROPONIN   LACTIC ACID   POCT LACTIC ACID   POC PREGNANCY UR-QUAL     CT ABDOMEN PELVIS W IV CONTRAST Additional Contrast? None   Final Result   S/p cholecystectomy. 3.6 cm x 2.6 cm x 3.9 cm gallbladder fossa gas   and fluid containing collection.              ED Course as of 02/10/23 1628   Thu Feb 10, 2023   1202 EKG at 1149-sinus tachycardia, 105 bpm,  nonspecific ST and T wave abnormality without STEMI. Qtc slightly prolonged at 504. EKG interpretation by Mellody Drown, MD   [JM]   [phone] Labs remarkable for mild anemia with hemoglobin of 10.2, stable compared to previous.  Slightly low potassium of 3.2, likely from recurrent emesis.  Will replenish with 10 mill equivalents of IV KCl.  Glucose elevated at 338, no anion gap. Not DKA.  AKI with creatinine 1.42. Calcium of 7.7.    CT show:  "there is a 3.6 cm x 2.6 cm x 3.9 cm gas and fluid containing collection in the gallbladder fossa."     Plan to consult general surgery to discuss.    [JM]   1530 Patient  signed out to Dr. Hoyt Koch pending surgery recommendations. Please see her note for updates to patient's ED course. I anticipate admission regardless as patient has required countless doses of IV narcotic, IV antiemetics here, so likely will require admission for her intractable abd pain, N/V. [JM]   1540 Noted patient to be desatting to 70s on monitor.  Immediately went to assess patient and she was sleeping with sats in the mid 70s with good waveform.  Patient started on 4 L/min nasal cannula with immediate improvement in sats.  Patient alert and conversant.  Still complaining of severe epigastric pain and nausea. [LT]   1545 Discussed with Dr. Laural Benes with general surgery, agree with plan for admission to hospitalist, surgery service to follow patient, recommend IR consult for drainage.  [LT]      ED Course User Index  [JM] Mellody Drown, MD  [LT] Donia Pounds, MD       Diagnosis:   1. Intractable epigastric abdominal pain    2. Nausea and vomiting, unspecified vomiting type    3. Post-operative state    4. S/P cholecystectomy    5. Hypokalemia    6. Hyperglycemia        Disposition:   Decision To Admit 02/10/2023 03:58:43 PM    Plan:       Perfect Serve Consult for Admission  3:58 PM    ED Room Number: ER08/08  Patient Name and age:  Melinda [name] 42 y.o.  female  Working Diagnosis:   1.  Intractable epigastric abdominal pain    2. Nausea and vomiting, unspecified vomiting type    3. Post-operative state    4. S/P cholecystectomy    5. Hypokalemia    6. Hyperglycemia        COVID-19 Suspicion: No  Sepsis present:  No  Reassessment needed: Yes  Code Status:  Full Code  Readmission: Yes  Isolation Requirements: no  Recommended Level of Care: telemetry  Department: [facility] - [phone]    Other: 42 year old female with history of type 2 diabetes, hypertension, hyperlipidemia, and status post cholecystectomy on 02/03/2023 who presented for evaluation of intractable epigastric abdominal pain, nausea, and vomiting that has been present even prior to surgery.  Patient reports that symptoms have been constant and persistent.  Has been unable to tolerate p.o. intake.  ED workup notable for AKI, hypokalemia, persistent tachycardia, and gallbladder fossa gas and fluid collection 3.6 x 2.6 x 3.9 cm.  Discussed with Dr. Laural Benes with general surgery who recommended admission to hospitalist for IR consultation for drainage.  General surgery team will follow. Patient on 4 LPM NC likely for desatting after multiple rounds of opioids during ED course.    Donia Pounds, MD       Donia Pounds, MD  02/10/23 [phone]

## 2023-02-10 NOTE — ED Triage Notes (Addendum)
Translator in use for triage.     Patient to ER for complaints of abdominal pain with N/V starting Monday 04/29.    Abdominal pain is in the sent or abdomen that radiates to the back.     Patient denies any urinary or bowel changes.     Patient recently had gallbladder removal sx on 04/26. Patient recently admitted for 11 days for similar symptoms.     Patient denies any chest pain or SOB.

## 2023-02-10 NOTE — ED Provider Notes (Signed)
[facility] EMERGENCY DEPT  EMERGENCY DEPARTMENT ENCOUNTER      Pt Name: Melinda [name]  MRN: [id number]  Birthdate 04/16/1981  Date of evaluation: 02/10/2023  Provider: Mellody Drown, MD    CHIEF COMPLAINT       Chief Complaint   Patient presents with    Abdominal Pain    Emesis     HISTORY OF PRESENT ILLNESS   (Location/Symptom, Timing/Onset, Context/Setting, Quality, Duration, Modifying Factors, Severity)  Note limiting factors.   HPI  42 year old female with past medical history of type 2 diabetes, hypertension, hyperlipidemia, status post recent cholecystectomy on 4/25, recent admission for epigastric abdominal pain, presents to the ER for evaluation of recurrent mid epigastric abdominal pain since Monday of this week associated with nausea and vomiting.  Patient reports during her recent admission in April for the same symptoms, they were unable to come to a definitive conclusion regarding the cause of her pain, but suspected possible gallbladder related etiology contributing to her current symptoms, which is why they performed a cholecystectomy.  Reports since the surgery, she has not noticed any interval improvement.  States pain intermittently radiates to her back.  She has been taking prescribed oxycodone for her symptoms at home, which she reports has not alleviated her pain.  She denies any fevers.  Denies any urinary symptoms.  Denies any diarrhea or constipation.  She is passing gas.    Review of External Medical Records:     Nursing Notes were reviewed.    REVIEW OF SYSTEMS    (2-9 systems for level 4, 10 or more for level 5)     Review of Systems   All other systems reviewed and are negative.      Except as noted above the remainder of the review of systems was reviewed and negative.       PAST MEDICAL HISTORY     Past Medical History:   Diagnosis Date    High blood pressure     High cholesterol     Type 2 diabetes mellitus (HCC)     Controled         SURGICAL HISTORY       Past Surgical History:    Procedure Laterality Date    CAPSULE ENDOSCOPY N/A 01/20/2023    ESOPHAGEAL CAPSULE ENDOSCOPY remove at 1624PM performed by Glyn Ade, MD at [facility] ENDOSCOPY    CHOLECYSTECTOMY, LAPAROSCOPIC N/A 02/03/2023    ROBOTIC LAPAROSCOPIC CHOLECYSTECTOMY with Indocyanine green performed by Verdis Frederickson, MD at [facility] MAIN OR    COLONOSCOPY N/A 01/19/2023    COLONOSCOPY DIAGNOSTIC performed by Glyn Ade, MD at [facility] ENDOSCOPY    OTHER SURGICAL HISTORY Left     Rentia attachment    TUBAL LIGATION Bilateral     UPPER GASTROINTESTINAL ENDOSCOPY N/A 01/17/2023    ESOPHAGOGASTRODUODENOSCOPY performed by Glyn Ade, MD at [facility] ENDOSCOPY    UPPER GASTROINTESTINAL ENDOSCOPY N/A 01/17/2023    ESOPHAGOGASTRODUODENOSCOPY BIOPSY performed by Glyn Ade, MD at [facility] ENDOSCOPY    UPPER GASTROINTESTINAL ENDOSCOPY N/A 01/18/2023    ESOPHAGOGASTRODUODENOSCOPY performed by Glyn Ade, MD at [facility] ENDOSCOPY         CURRENT MEDICATIONS       Previous Medications    AMLODIPINE (NORVASC) 10 MG TABLET    Take 1 tablet by mouth daily    FERROUS SULFATE (IRON 325) 325 (65 FE) MG TABLET    Take 1 tablet by mouth every morning (before breakfast)    INSULIN 70-30 (NOVOLIN 70/30) (70-30) 100  UNIT PER ML INJECTION VIAL    Inject 15 Units into the skin 2 times daily    LISINOPRIL (PRINIVIL;ZESTRIL) 40 MG TABLET    Take 1 tablet by mouth daily    METOPROLOL SUCCINATE (TOPROL XL) 50 MG EXTENDED RELEASE TABLET    Take 1 tablet by mouth daily    NALOXONE (NARCAN) 4 MG/0.1ML LIQD NASAL SPRAY    1 spray by Nasal route as needed for Opioid Reversal    ONDANSETRON (ZOFRAN-ODT) 4 MG DISINTEGRATING TABLET    Take 1 tablet by mouth every 8 hours as needed for Nausea or Vomiting    POLYETHYLENE GLYCOL (GLYCOLAX) 17 G PACKET    Take 1 packet by mouth daily as needed for Constipation       ALLERGIES     Patient has no known allergies.    FAMILY HISTORY     No family history on file.       SOCIAL HISTORY       Social History     Socioeconomic History    Marital  status: Married   Tobacco Use    Smoking status: Never    Smokeless tobacco: Never   Vaping Use    Vaping Use: Never used   Substance and Sexual Activity    Alcohol use: Never    Drug use: Never     Social Determinants of Health     Food Insecurity: Food Insecurity Present (02/01/2023)    Hunger Vital Sign     Worried About Running Out of Food in the Last Year: Sometimes true     Ran Out of Food in the Last Year: Sometimes true   Transportation Needs: No Transportation Needs (02/01/2023)    PRAPARE - Therapist, art (Medical): No     Lack of Transportation (Non-Medical): No   Housing Stability: Low Risk  (02/01/2023)    Housing Stability Vital Sign     Unable to Pay for Housing in the Last Year: No     Number of Places Lived in the Last Year: 1     Unstable Housing in the Last Year: No           PHYSICAL EXAM    (up to 7 for level 4, 8 or more for level 5)     ED Triage Vitals   BP Temp Temp src Pulse Resp SpO2 Height Weight   -- -- -- -- -- -- -- --       Body mass index is 27.27 kg/m.    Physical Exam  Vitals and nursing note reviewed.   Constitutional:       General: She is in acute distress.      Appearance: She is well-developed and normal weight. She is not ill-appearing or toxic-appearing.   HENT:      Head: Normocephalic and atraumatic.      Mouth/Throat:      Mouth: Mucous membranes are moist.      Pharynx: Oropharynx is clear.   Eyes:      Extraocular Movements: Extraocular movements intact.      Pupils: Pupils are equal, round, and reactive to light.   Cardiovascular:      Rate and Rhythm: Regular rhythm. Tachycardia present.   Pulmonary:      Effort: Pulmonary effort is normal.      Breath sounds: Normal breath sounds.   Chest:      Chest wall: No deformity, tenderness or edema.   Abdominal:  Palpations: Abdomen is soft.      Tenderness: There is generalized abdominal tenderness and tenderness in the epigastric area.      Comments: Recent laparoscopic cholecystectomy incisions  noted, appearing clean and dry   Musculoskeletal:         General: Normal range of motion.      Cervical back: Normal range of motion and neck supple.   Skin:     General: Skin is warm and dry.      Capillary Refill: Capillary refill takes less than 2 seconds.   Neurological:      General: No focal deficit present.      Mental Status: She is alert.   Psychiatric:         Mood and Affect: Mood is anxious.         DIAGNOSTIC RESULTS     EKG: All EKG's are interpreted by the Emergency Department Physician who either signs or Co-signs this chart in the absence of a cardiologist.        RADIOLOGY:   Non-plain film images such as CT, Ultrasound and MRI are read by the radiologist. Plain radiographic images are visualized and preliminarily interpreted by the emergency physician with the below findings:        Interpretation per the Radiologist below, if available at the time of this note:    CT ABDOMEN PELVIS W IV CONTRAST Additional Contrast? None   Final Result   S/p cholecystectomy. 3.6 cm x 2.6 cm x 3.9 cm gallbladder fossa gas   and fluid containing collection.               LABS:  Labs Reviewed   CBC WITH AUTO DIFFERENTIAL - Abnormal; Notable for the following components:       Result Value    Hemoglobin 10.2 (*)     Hematocrit 30.7 (*)     MCV 76.9 (*)     MCH 25.6 (*)     RDW 19.9 (*)     Platelets 448 (*)     Neutrophils % 81 (*)     Monocytes % 4 (*)     All other components within normal limits   COMPREHENSIVE METABOLIC PANEL - Abnormal; Notable for the following components:    Potassium 3.2 (*)     Chloride 109 (*)     Glucose 338 (*)     Creatinine 1.42 (*)     Bun/Cre Ratio 11 (*)     Est, Glom Filt Rate 48 (*)     Calcium 7.7 (*)     Total Protein 6.0 (*)     Albumin 2.0 (*)     Albumin/Globulin Ratio 0.5 (*)     All other components within normal limits   EXTRA TUBES HOLD   HCG, SERUM, QUALITATIVE   MAGNESIUM   LIPASE   TROPONIN   LACTIC ACID   URINALYSIS WITH REFLEX TO CULTURE   POCT LACTIC ACID   POC  PREGNANCY UR-QUAL       All other labs were within normal range or not returned as of this dictation.    EMERGENCY DEPARTMENT COURSE and DIFFERENTIAL DIAGNOSIS/MDM:   Vitals:    Vitals:    02/10/23 1140 02/10/23 1215 02/10/23 1230 02/10/23 1233   BP: (!) 185/111 (!) 184/142 (!) 211/104    Pulse: (!) 112 (!) 110 (!) 109    Resp: 18 21 18 20    Temp: 98.7 F (37.1 C)      TempSrc: Oral  SpO2: 100% 100% 100%    Weight: 61.2 kg (135 lb)      Height: 1.499 m (4\' 11" )            Medical Decision Making  Patient presenting for evaluation of recurrent midepigastric abdominal pain associated with nausea and vomiting.  Has had 2 previous admissions for similar pain, with recent removal of gallbladder on 4/25, though etiology of her ongoing pain has been unclear thus far.  Patient has not had any improvement in her abdominal pain symptoms since cholecystectomy.  In the ER, she is tachycardic, hypertensive, appearing uncomfortable. Afebrile and nontoxic.  Differential diagnoses considered include: Choledocholithiasis, postop complication, dyspepsia, gastritis, PUD, pancreatitis, UTI, kidney stone, atypical presentation of ACS, obstruction, etc.  Plan to obtain EKG, troponin, CBC, CMP, lipase, UA, urine pregnancy, lactic acid, and CT abdomen/pelvis.  Will medicate patient for pain, nausea, and administer IVF.    Amount and/or Complexity of Data Reviewed  Labs: ordered.  Radiology: ordered.  ECG/medicine tests: ordered.    Risk  Prescription drug management.        REASSESSMENT     ED Course as of 02/10/23 1533   Thu Feb 10, 2023   1202 EKG at 1149-sinus tachycardia, 105 bpm, nonspecific ST and T wave abnormality without STEMI. Qtc slightly prolonged at 504. EKG interpretation by Mellody Drown, MD   [JM]   [phone] Labs remarkable for mild anemia with hemoglobin of 10.2, stable compared to previous.  Slightly low potassium of 3.2, likely from recurrent emesis.  Will replenish with 10 mill equivalents of IV KCl.  Glucose elevated at  338, no anion gap. Not DKA.  AKI with creatinine 1.42. Calcium of 7.7.    CT show:  "there is a 3.6 cm x 2.6 cm x 3.9 cm gas and fluid containing collection in the gallbladder fossa."     Plan to consult general surgery to discuss.    [JM]   1530 Patient signed out to Dr. Hoyt Koch pending surgery recommendations. Please see her note for updates to patient's ED course. I anticipate admission regardless as patient has required countless doses of IV narcotic, IV antiemetics here, so likely will require admission for her intractable abd pain, N/V. [JM]      ED Course User Index  [JM] Mellody Drown, MD           CONSULTS:  IP CONSULT TO GENERAL SURGERY    PROCEDURES:  Unless otherwise noted below, none     Procedures      FINAL IMPRESSION      1. Intractable epigastric abdominal pain    2. Nausea and vomiting, unspecified vomiting type    3. Post-operative state    4. S/P cholecystectomy    5. Hypokalemia    6. Hyperglycemia          DISPOSITION/PLAN   DISPOSITION  Anticipate admission      PATIENT REFERRED TO:  No follow-up provider specified.    DISCHARGE MEDICATIONS:  New Prescriptions    No medications on file         (Please note that portions of this note were completed with a voice recognition program.  Efforts were made to edit the dictations but occasionally words are mis-transcribed.)    Mellody Drown, MD (electronically signed)  Emergency Attending Physician / Physician Assistant / Nurse Practitioner              Mellody Drown, MD  02/10/23 [phone]

## 2023-02-10 NOTE — Consults (Signed)
Session ID: [id number]  Language: Spanish  Interpreter ID: #[id number]  Interpreter Name: Lafonda Mosses

## 2023-02-11 ENCOUNTER — Inpatient Hospital Stay: Admit: 2023-02-11 | Payer: MEDICAID | Primary: Adult Health

## 2023-02-11 LAB — CBC WITH AUTO DIFFERENTIAL
Basophils %: 1 % (ref 0–1)
Basophils Absolute: 0.1 10*3/uL (ref 0.0–0.1)
Eosinophils %: 1 % (ref 0–7)
Eosinophils Absolute: 0.1 10*3/uL (ref 0.0–0.4)
Hematocrit: 29.8 % — ABNORMAL LOW (ref 35.0–47.0)
Hemoglobin: 9.5 g/dL — ABNORMAL LOW (ref 11.5–16.0)
Immature Granulocytes %: 0 % (ref 0.0–0.5)
Immature Granulocytes Absolute: 0 10*3/uL (ref 0.00–0.04)
Lymphocytes %: 18 % (ref 12–49)
Lymphocytes Absolute: 2.5 10*3/uL (ref 0.8–3.5)
MCH: 25.4 PG — ABNORMAL LOW (ref 26.0–34.0)
MCHC: 31.9 g/dL (ref 30.0–36.5)
MCV: 79.7 FL — ABNORMAL LOW (ref 80.0–99.0)
MPV: 9.2 FL (ref 8.9–12.9)
Monocytes %: 8 % (ref 5–13)
Monocytes Absolute: 1.1 10*3/uL — ABNORMAL HIGH (ref 0.0–1.0)
Neutrophils %: 72 % (ref 32–75)
Neutrophils Absolute: 9.9 10*3/uL — ABNORMAL HIGH (ref 1.8–8.0)
Nucleated RBCs: 0 PER 100 WBC
Platelets: 485 10*3/uL — ABNORMAL HIGH (ref 150–400)
RBC: 3.74 M/uL — ABNORMAL LOW (ref 3.80–5.20)
RDW: 20.5 % — ABNORMAL HIGH (ref 11.5–14.5)
WBC: 13.7 10*3/uL — ABNORMAL HIGH (ref 3.6–11.0)
nRBC: 0 10*3/uL (ref 0.00–0.01)

## 2023-02-11 LAB — HEMOGLOBIN A1C
Estimated Avg Glucose: 197 mg/dL
Hemoglobin A1C: 8.5 % — ABNORMAL HIGH (ref 4.0–5.6)

## 2023-02-11 LAB — POCT GLUCOSE
POC Glucose: 309 mg/dL — ABNORMAL HIGH (ref 65–117)
POC Glucose: 262 mg/dL — ABNORMAL HIGH (ref 65–117)
POC Glucose: 283 mg/dL — ABNORMAL HIGH (ref 65–117)
POC Glucose: 256 mg/dL — ABNORMAL HIGH (ref 65–117)
POC Glucose: 165 mg/dL — ABNORMAL HIGH (ref 65–117)

## 2023-02-11 LAB — BASIC METABOLIC PANEL W/ REFLEX TO MG FOR LOW K
Anion Gap: 8 mmol/L (ref 5–15)
BUN/Creatinine Ratio: 9 — ABNORMAL LOW (ref 12–20)
BUN: 19 MG/DL (ref 6–20)
CO2: 29 mmol/L (ref 21–32)
Calcium: 8.4 MG/DL — ABNORMAL LOW (ref 8.5–10.1)
Chloride: 104 mmol/L (ref 97–108)
Creatinine: 2.01 MG/DL — ABNORMAL HIGH (ref 0.55–1.02)
Est, Glom Filt Rate: 32 mL/min/{1.73_m2} — ABNORMAL LOW (ref >60)
Glucose: 224 mg/dL — ABNORMAL HIGH (ref 65–100)
Potassium: 3.5 mmol/L (ref 3.5–5.1)
Sodium: 141 mmol/L (ref 136–145)

## 2023-02-11 LAB — MAGNESIUM: Magnesium: 2.3 mg/dL (ref 1.6–2.4)

## 2023-02-11 MED ORDER — PROCHLORPERAZINE EDISYLATE 10 MG/2ML IJ SOLN
102 MG/2ML | Freq: Four times a day (QID) | INTRAMUSCULAR | Status: DC | PRN
Start: 2023-02-11 — End: 2023-02-14
  Administered 2023-02-11 – 2023-02-13 (×5): 10 mg via INTRAVENOUS

## 2023-02-11 MED ORDER — PIPERACILLIN SOD-TAZOBACTAM SO 3.375 (3-0.375) G IV SOLR
3.375 | Freq: Three times a day (TID) | INTRAVENOUS | Status: DC
Start: 2023-02-11 — End: 2023-02-12
  Administered 2023-02-11 – 2023-02-12 (×4): 3375 mg via INTRAVENOUS

## 2023-02-11 MED ORDER — ACETAMINOPHEN 325 MG PO TABS
325 MG | Freq: Four times a day (QID) | ORAL | Status: DC | PRN
Start: 2023-02-11 — End: 2023-02-14
  Administered 2023-02-11: 09:00:00 650 mg via ORAL

## 2023-02-11 MED ORDER — INSULIN LISPRO 100 UNIT/ML IJ SOLN
100 | Freq: Four times a day (QID) | INTRAMUSCULAR | Status: DC
Start: 2023-02-11 — End: 2023-02-13
  Administered 2023-02-12 – 2023-02-13 (×2): 2 [IU] via SUBCUTANEOUS

## 2023-02-11 MED ORDER — INSULIN LISPRO 100 UNIT/ML IJ SOLN
100 | Freq: Three times a day (TID) | INTRAMUSCULAR | Status: DC
Start: 2023-02-11 — End: 2023-02-11
  Administered 2023-02-11: 18:00:00 2 [IU] via SUBCUTANEOUS

## 2023-02-11 MED ORDER — POLYETHYLENE GLYCOL 3350 17 G PO PACK
17 | Freq: Every day | ORAL | Status: DC | PRN
Start: 2023-02-11 — End: 2023-02-12

## 2023-02-11 MED ORDER — HYDROMORPHONE HCL PF 1 MG/ML IJ SOLN
1 | INTRAMUSCULAR | Status: DC | PRN
Start: 2023-02-11 — End: 2023-02-12
  Administered 2023-02-11 – 2023-02-12 (×6): 1 mg via INTRAVENOUS

## 2023-02-11 MED ORDER — ONDANSETRON HCL 4 MG/2ML IJ SOLN
42 MG/2ML | Freq: Four times a day (QID) | INTRAMUSCULAR | Status: DC | PRN
Start: 2023-02-11 — End: 2023-02-14
  Administered 2023-02-11 – 2023-02-14 (×4): 4 mg via INTRAVENOUS

## 2023-02-11 MED ORDER — PIPERACILLIN SOD-TAZOBACTAM SO 4.5 (4-0.5) G IV SOLR
4.5 (4-0.5) g | Freq: Once | INTRAVENOUS | Status: AC
Start: 2023-02-11 — End: 2023-02-10
  Administered 2023-02-11: 02:00:00 4500 mg via INTRAVENOUS

## 2023-02-11 MED ORDER — HYDRALAZINE HCL 20 MG/ML IJ SOLN
20 | Freq: Four times a day (QID) | INTRAMUSCULAR | Status: DC | PRN
Start: 2023-02-11 — End: 2023-02-12
  Administered 2023-02-11 – 2023-02-12 (×4): 10 mg via INTRAVENOUS

## 2023-02-11 MED ORDER — HYDROMORPHONE 0.5MG/0.5ML IJ SOLN
1 | Status: DC | PRN
Start: 2023-02-11 — End: 2023-02-11
  Administered 2023-02-11 (×5): 0.5 mg via INTRAVENOUS

## 2023-02-11 MED ORDER — HYDROMORPHONE HCL PF 1 MG/ML IJ SOLN
1 | Freq: Once | INTRAMUSCULAR | Status: DC
Start: 2023-02-11 — End: 2023-02-11

## 2023-02-11 MED ORDER — NORMAL SALINE FLUSH 0.9 % IV SOLN
0.9 % | INTRAVENOUS | Status: DC | PRN
Start: 2023-02-11 — End: 2023-02-14

## 2023-02-11 MED ORDER — MIDAZOLAM HCL 5 MG/5ML IJ SOLN
5 | INTRAMUSCULAR | Status: DC | PRN
Start: 2023-02-11 — End: 2023-02-11
  Administered 2023-02-11 (×3): 1 mg via INTRAVENOUS

## 2023-02-11 MED ORDER — PHENOL 1.4 % MT LIQD
1.4 % | OROMUCOSAL | Status: DC | PRN
Start: 2023-02-11 — End: 2023-02-14

## 2023-02-11 MED ORDER — SENNA-DOCUSATE SODIUM 8.6-50 MG PO TABS
Freq: Every day | ORAL | Status: DC
Start: 2023-02-11 — End: 2023-02-12
  Administered 2023-02-11: 18:00:00 2 via ORAL

## 2023-02-11 MED ORDER — FENTANYL CITRATE (PF) 100 MCG/2ML IJ SOLN
100 | INTRAMUSCULAR | Status: DC | PRN
Start: 2023-02-11 — End: 2023-02-11
  Administered 2023-02-11 (×4): 25 ug via INTRAVENOUS

## 2023-02-11 MED ORDER — HYDROMORPHONE 0.5MG/0.5ML IJ SOLN
1 | Freq: Once | Status: AC
Start: 2023-02-11 — End: 2023-02-11
  Administered 2023-02-11: 19:00:00 0.5 mg via INTRAVENOUS

## 2023-02-11 MED ORDER — POTASSIUM CHLORIDE 10 MEQ/100ML IV SOLN
10 | INTRAVENOUS | Status: DC | PRN
Start: 2023-02-11 — End: 2023-02-11

## 2023-02-11 MED ORDER — DEXTROSE 10 % IV BOLUS
INTRAVENOUS | Status: DC | PRN
Start: 2023-02-11 — End: 2023-02-14

## 2023-02-11 MED ORDER — LIDOCAINE HCL (PF) 1 % IJ SOLN
1 % | INTRAMUSCULAR | Status: AC
  Administered 2023-02-11: 17:00:00 10 via SUBCUTANEOUS

## 2023-02-11 MED ORDER — INSULIN LISPRO 100 UNIT/ML IJ SOLN
100 | Freq: Every evening | INTRAMUSCULAR | Status: DC
Start: 2023-02-11 — End: 2023-02-11
  Administered 2023-02-11: 04:00:00 4 [IU] via SUBCUTANEOUS

## 2023-02-11 MED ORDER — ACETAMINOPHEN 650 MG RE SUPP
650 MG | Freq: Four times a day (QID) | RECTAL | Status: DC | PRN
Start: 2023-02-11 — End: 2023-02-14

## 2023-02-11 MED ORDER — ONDANSETRON HCL 4 MG/2ML IJ SOLN
4 | Freq: Once | INTRAMUSCULAR | Status: DC
Start: 2023-02-11 — End: 2023-02-11

## 2023-02-11 MED ORDER — DEXTROSE 10 % IV SOLN
10 % | INTRAVENOUS | Status: DC | PRN
Start: 2023-02-11 — End: 2023-02-14

## 2023-02-11 MED ORDER — LIDOCAINE HCL (PF) 1 % IJ SOLN
1 | Freq: Once | INTRAMUSCULAR | Status: AC
Start: 2023-02-11 — End: 2023-02-11
  Administered 2023-02-11: 17:00:00 5 mL via INTRADERMAL

## 2023-02-11 MED ORDER — METOCLOPRAMIDE HCL 5 MG/ML IJ SOLN
5 | Freq: Once | INTRAMUSCULAR | Status: AC
Start: 2023-02-11 — End: 2023-02-11
  Administered 2023-02-11: 19:00:00 5 mg via INTRAVENOUS

## 2023-02-11 MED ORDER — METOPROLOL TARTRATE 5 MG/5ML IV SOLN
5 | Freq: Four times a day (QID) | INTRAVENOUS | Status: DC
Start: 2023-02-11 — End: 2023-02-12
  Administered 2023-02-12 (×2): 2.5 mg via INTRAVENOUS

## 2023-02-11 MED ORDER — GLUCOSE 4 G PO CHEW
4 g | ORAL | Status: DC | PRN
Start: 2023-02-11 — End: 2023-02-14

## 2023-02-11 MED ORDER — SODIUM CHLORIDE (PF) 0.9 % IJ SOLN
0.9 % | Freq: Two times a day (BID) | INTRAMUSCULAR | Status: DC
Start: 2023-02-11 — End: 2023-02-13
  Administered 2023-02-11 – 2023-02-13 (×4): 40 mg via INTRAVENOUS

## 2023-02-11 MED ORDER — SODIUM CHLORIDE 0.9 % IV SOLN
0.9 % | INTRAVENOUS | Status: DC | PRN
Start: 2023-02-11 — End: 2023-02-14

## 2023-02-11 MED ORDER — POTASSIUM BICARB-CITRIC ACID 20 MEQ PO TBEF
20 | ORAL | Status: DC | PRN
Start: 2023-02-11 — End: 2023-02-11

## 2023-02-11 MED ORDER — METOCLOPRAMIDE HCL 5 MG/ML IJ SOLN
5 | Freq: Four times a day (QID) | INTRAMUSCULAR | Status: DC
Start: 2023-02-11 — End: 2023-02-11

## 2023-02-11 MED ORDER — NORMAL SALINE FLUSH 0.9 % IV SOLN
0.9 % | Freq: Two times a day (BID) | INTRAVENOUS | Status: DC
Start: 2023-02-11 — End: 2023-02-14
  Administered 2023-02-11 – 2023-02-14 (×5): 10 mL via INTRAVENOUS

## 2023-02-11 MED ORDER — DOCUSATE SODIUM 50 MG/5ML PO LIQD
50 | Freq: Two times a day (BID) | ORAL | Status: DC
Start: 2023-02-11 — End: 2023-02-12
  Administered 2023-02-12: 12:00:00 100 mg via NASOGASTRIC

## 2023-02-11 MED ORDER — METOCLOPRAMIDE HCL 5 MG/ML IJ SOLN
5 | Freq: Four times a day (QID) | INTRAMUSCULAR | Status: DC
Start: 2023-02-11 — End: 2023-02-13
  Administered 2023-02-12 – 2023-02-13 (×7): 10 mg via INTRAVENOUS

## 2023-02-11 MED ORDER — INSULIN NPH (HUMAN) (ISOPHANE) 100 UNIT/ML SC SUSP
100 | Freq: Two times a day (BID) | SUBCUTANEOUS | Status: DC
Start: 2023-02-11 — End: 2023-02-12
  Administered 2023-02-11: 13:00:00 6 [IU]/kg via SUBCUTANEOUS

## 2023-02-11 MED ORDER — MAGNESIUM SULFATE 2000 MG/50 ML IVPB PREMIX
2 | INTRAVENOUS | Status: DC | PRN
Start: 2023-02-11 — End: 2023-02-11

## 2023-02-11 MED ORDER — ONDANSETRON 4 MG PO TBDP
4 MG | Freq: Three times a day (TID) | ORAL | Status: DC | PRN
Start: 2023-02-11 — End: 2023-02-14

## 2023-02-11 MED ORDER — SODIUM CHLORIDE 0.9 % IV SOLN
0.9 % | INTRAVENOUS | Status: DC
  Administered 2023-02-11: 02:00:00 via INTRAVENOUS

## 2023-02-11 MED ORDER — HYDROMORPHONE HCL PF 1 MG/ML IJ SOLN
1 | INTRAMUSCULAR | Status: DC | PRN
Start: 2023-02-11 — End: 2023-02-10

## 2023-02-11 MED ORDER — FENTANYL CITRATE (PF) 100 MCG/2ML IJ SOLN
100 | Freq: Once | INTRAMUSCULAR | Status: AC
Start: 2023-02-11 — End: 2023-02-11
  Administered 2023-02-11: 17:00:00 50 ug via INTRAVENOUS

## 2023-02-11 MED ORDER — POTASSIUM CHLORIDE ER 10 MEQ PO TBCR
10 | ORAL | Status: DC | PRN
Start: 2023-02-11 — End: 2023-02-11

## 2023-02-11 MED ORDER — METOPROLOL SUCCINATE ER 25 MG PO TB24
25 | Freq: Every day | ORAL | Status: DC
Start: 2023-02-11 — End: 2023-02-12
  Administered 2023-02-11: 14:00:00 25 mg via ORAL

## 2023-02-11 MED ORDER — GLUCAGON (RDNA) 1 MG IJ KIT
1 MG | INTRAMUSCULAR | Status: DC | PRN
Start: 2023-02-11 — End: 2023-02-14

## 2023-02-11 MED FILL — PROCHLORPERAZINE EDISYLATE 10 MG/2ML IJ SOLN: 10 MG/2ML | INTRAMUSCULAR | Qty: 2

## 2023-02-11 MED FILL — HYDRALAZINE HCL 20 MG/ML IJ SOLN: 20 MG/ML | INTRAMUSCULAR | Qty: 1

## 2023-02-11 MED FILL — ACETAMINOPHEN 325 MG PO TABS: 325 MG | ORAL | Qty: 2

## 2023-02-11 MED FILL — PANTOPRAZOLE SODIUM 40 MG IV SOLR: 40 MG | INTRAVENOUS | Qty: 40

## 2023-02-11 MED FILL — LIDOCAINE HCL (PF) 1 % IJ SOLN: 1 % | INTRAMUSCULAR | Qty: 10

## 2023-02-11 MED FILL — CHLORASEPTIC 1.4 % MT LIQD: 1.4 % | OROMUCOSAL | Qty: 177

## 2023-02-11 MED FILL — HUMULIN N 100 UNIT/ML SC SUSP: 100 UNIT/ML | SUBCUTANEOUS | Qty: 6

## 2023-02-11 MED FILL — PIPERACILLIN SOD-TAZOBACTAM SO 3.375 (3-0.375) G IV SOLR: 3.375 (3-0.375) g | INTRAVENOUS | Qty: 3375

## 2023-02-11 MED FILL — INSULIN LISPRO 100 UNIT/ML IJ SOLN: 100 UNIT/ML | INTRAMUSCULAR | Qty: 2

## 2023-02-11 MED FILL — HYDROMORPHONE HCL 1 MG/ML IJ SOLN: 1 MG/ML | INTRAMUSCULAR | Qty: 0.5

## 2023-02-11 MED FILL — DEXTROSE 10 % IV SOLN: 10 % | INTRAVENOUS | Qty: 500

## 2023-02-11 MED FILL — PIPERACILLIN SOD-TAZOBACTAM SO 4.5 (4-0.5) G IV SOLR: 4.5 (4-0.5) g | INTRAVENOUS | Qty: 4500

## 2023-02-11 MED FILL — STOOL SOFTENER/LAXATIVE 50-8.6 MG PO TABS: ORAL | Qty: 2

## 2023-02-11 MED FILL — METOPROLOL SUCCINATE ER 25 MG PO TB24: 25 MG | ORAL | Qty: 1

## 2023-02-11 MED FILL — MIDAZOLAM HCL 5 MG/5ML IJ SOLN: 5 MG/ML | INTRAMUSCULAR | Qty: 5

## 2023-02-11 MED FILL — METOCLOPRAMIDE HCL 5 MG/ML IJ SOLN: 5 MG/ML | INTRAMUSCULAR | Qty: 2

## 2023-02-11 MED FILL — ONDANSETRON HCL 4 MG/2ML IJ SOLN: 4 MG/2ML | INTRAMUSCULAR | Qty: 2

## 2023-02-11 MED FILL — HYDROMORPHONE HCL 1 MG/ML IJ SOLN: 1 MG/ML | INTRAMUSCULAR | Qty: 1

## 2023-02-11 MED FILL — FENTANYL CITRATE (PF) 100 MCG/2ML IJ SOLN: 100 MCG/2ML | INTRAMUSCULAR | Qty: 2

## 2023-02-11 MED FILL — BD POSIFLUSH 0.9 % IV SOLN: 0.9 % | INTRAVENOUS | Qty: 40

## 2023-02-11 MED FILL — INSULIN LISPRO 100 UNIT/ML IJ SOLN: 100 UNIT/ML | INTRAMUSCULAR | Qty: 4

## 2023-02-11 MED FILL — SODIUM CHLORIDE 0.9 % IV SOLN: 0.9 % | INTRAVENOUS | Qty: 1000

## 2023-02-11 NOTE — Progress Notes (Signed)
Patient removed NG tube that was placed on 5/3 for suctioning. Patient said it was feeling uncomfortable in her throat. She has agreed to have it replaced after educating her on the importance of having this tube to help her as she was vomiting continuously. Dr. Welton Flakes notified.

## 2023-02-11 NOTE — Progress Notes (Signed)
Spiritual Care Assessment/Progress Note  [facility]    Name: Somtochukwu Mahala MRN: [id number]    Age: 42 y.o.     Sex: female   Language: Spanish     Date: 02/11/2023            Total Time Calculated: 27 min              Spiritual Assessment begun in [facility] B3 INTERMEDIATE CARE UNIT  Service Provided For: Patient not available     Encounter Overview/Reason: Advance Care Planning    Spiritual beliefs:      [x]  Involved in a faith tradition/spiritual practice:  Catholic      []  Supported by a faith community:      []  Claims no spiritual orientation:      []  Seeking spiritual identity:           []  Adheres to an individual form of spirituality:      []  Not able to assess:                Identified resources for coping and support system:   Support System: Children, Significant other       [x]  Prayer                  []  Devotional reading               []  Music                  []  Guided Imagery     []  Pet visits                                        []  Other: (COMMENT)     Specific area/focus of visit   Encounter:    Crisis:    Spiritual/Emotional needs: Type: Spiritual Support  Ritual, Rites and Sacraments:    Grief, Loss, and Adjustments:    Ethics/Mediation:    Behavioral Health:    Palliative Care:    Advance Care Planning: Type: ACP conversation         Narrative:   Spiritual Consult for Advance Medical Directive (AMD):  Reviewed chart and met Pt's daughter in room. Pt is out in procedure at this time. Daughter shared some life review; that Pt has 3 daughters, Pt is getting married next week and does attend a church. Daughter does not know anything about the AMD. She expressed gratitude for coming and welcomed to come when Pt is back. Chaplains will remain available for continued care and support.     Chaplain Hale Bogus, M.Div., [facility].  [facility] Spiritual Health Team [phone] PRAY [phone])

## 2023-02-11 NOTE — Progress Notes (Signed)
Spiritual Care Assessment/Progress Note  [facility]    Name: Melinda [name] MRN: [id number]    Age: 42 y.o.     Sex: female   Language: Spanish     Date: 02/11/2023            Total Time Calculated: 50 min              Spiritual Assessment begun in [facility] B3 INTERMEDIATE CARE UNIT  Service Provided For: Patient     Encounter Overview/Reason: Advance Care Planning    Spiritual beliefs:      [x]  Involved in a faith tradition/spiritual practice:  Catholic      []  Supported by a faith community:      []  Claims no spiritual orientation:      []  Seeking spiritual identity:           []  Adheres to an individual form of spirituality:      []  Not able to assess:                Identified resources for coping and support system:   Support System: Children, Significant other       [x]  Prayer                  []  Devotional reading               []  Music                  []  Guided Imagery     []  Pet visits                                        []  Other: (COMMENT)     Specific area/focus of visit   Encounter:    Crisis:    Spiritual/Emotional needs: Type: Spiritual Support  Ritual, Rites and Sacraments:    Grief, Loss, and Adjustments:    Ethics/Mediation:    Behavioral Health:    Palliative Care:    Advance Care Planning: Type: ACP conversation         Narrative:   Chaplain attempted visit and also for Advance Medical Directive (AMD).  Reviewed chart and contacted attending RN Corrie Dandy who shared status. Pt is Spanish speaking and called Spanish interpreter Ms Debarah Crape and left HIPAA approved VM. Chaplain also met Pt bedside but English speaking daughter is not present at this time. Pt is still feeling very sick and Chaplain was able to communicate enough to let her know that Lunette Stands is praying for her wellness. Pt expressed her gratitude.   Chaplains will continue to follow as able.     Chaplain Hale Bogus, M.Div., [facility].  [facility] Spiritual Health Team [phone] PRAY [phone])

## 2023-02-11 NOTE — Plan of Care (Addendum)
Patient admitted to the unit at 76. A&O x4, requires translation as she is spanish speaking. Daughter at the bedside. Skin W/D/I, old lap sites to abd, closed and approximated. Breathing even and unlabored, no SOB/acute distress noted. PRN Hydralazine given for elevated BP 205/108, effective results, recheck pressure was 138/83. C/O pain to abd, medicated with dilaudid.  N/V persists, medicated with IV Zofran, non-effective. SNO for compazine. Pt is currently NPO.     Problem: Discharge Planning  Goal: Discharge to home or other facility with appropriate resources  Outcome: Progressing     Problem: Pain  Goal: Verbalizes/displays adequate comfort level or baseline comfort level  Outcome: Progressing  Note: Pt c/o pain to abd, medicated x2 with dilaudid with effective results.     Problem: ABCDS Injury Assessment  Goal: Absence of physical injury  Outcome: Progressing

## 2023-02-11 NOTE — Progress Notes (Addendum)
Difficult to visualize termination of NG tube; will await official radiology read    Radiologist report: NGT in position

## 2023-02-11 NOTE — ACP (Advance Care Planning) (Signed)
Advance Care Planning     Advance Care Planning Inpatient Note  Spiritual Care Department    Today's Date: 02/11/2023  Unit: [facility] INTERMEDIATE CARE UNIT    Received request from admission screening.  Upon review of chart and communication with care team, . Child/Children was/were present in the room during visit.    Goals of ACP Conversation:  Facilitate a discussion related to patient's goals of care as they align with the patient's values and beliefs.    Health Care Decision Makers:     No healthcare decision makers have been documented.  Click here to complete Clinical research associate of the Environmental health practitioner Relationship (ie "Primary")  Summary:  No Decision Maker named by patient at this time    Advance Care Planning Documents (Patient Wishes):  None     Assessment:  Spiritual Consult for Advance Medical Directive (AMD):  Reviewed chart and met Pt's daughter in room. Pt is out in procedure at this time. Daughter shared some life review; that Pt has 3 daughters, Pt is getting married next week and does attend a church. Daughter does not know anything about the AMD. She expressed gratitude for coming and welcomed to come when Pt is back. Chaplains will remain available for continued care and support.       Interventions:  Pt not available, will try again as able     Care Preferences Communicated:   No    Outcomes/Plan:  ACP Discussion: Postponed    Electronically signed by Hale Bogus, [facility] on 02/11/2023 at 12:45 PM

## 2023-02-11 NOTE — Care Coordination-Inpatient (Signed)
Care Management Initial Assessment Note:        02/11/23 1002   Service Assessment   Patient Orientation Alert and Oriented   Cognition Alert   History Provided By Child/Family   Primary Caregiver Self   Accompanied By/Relationship daughter   Support Systems Family Members   Patient's Healthcare Decision Maker is: Legal Next of Kin   PCP Verified by CM Yes   Last Visit to PCP Within last 3 months   Prior Functional Level Independent in ADLs/IADLs   Current Functional Level Independent in ADLs/IADLs   Can patient return to prior living arrangement Yes   Ability to make needs known: Good   Family able to assist with home care needs: Yes   Would you like for me to discuss the discharge plan with any other family members/significant others, and if so, who? Yes  (daughter at bedside, declined interpreter)   Architect None   Social/Functional History   Lives With Family   Type of Home House   Home Layout One level   Home Access Stairs to enter with rails   Entrance Stairs - Number of Steps 4   Entrance Stairs - Rails Both   Bathroom Shower/Tub Walk-in Chemical engineer None   Receives Help From Other (comment)  (independent at baseline)   ADL Assistance Independent   Homemaking Assistance Independent   Ambulation Assistance Independent   Active Driver No   Patient's Driver Info family   Occupation Unemployed   Discharge Planning   Type of Residence House   Living Arrangements Family Members   Current Services Prior To Admission None   Potential Assistance Needed   (no insurance [facility] referral sent)   Photographer Medications   (uses Chief of Staff for Meds)   Type of Home Care Services None   Patient expects to be discharged to: [employer] At/After Discharge   Mode of Transport at Discharge Other (see comment)  (family)   Confirm Follow Up Transport Self     Per patient's  daughter who is English speaking (patient declined to use the virtual interpreter with daughter present), patient lives with family. Patient is independent in all ADL/IADLs, is unemployed, not a current driver. Patient has no hx of DME or PAC services. Discussed d/c with patient, plan is to d/c home once medically clear. Family to transport at time of d/c. CM sent patient info to [facility] for MCD screening. On last admission patient Care Card/ Care Fund application was started, still awaiting financial forms to be submitted per [facility]. CM following.      Readmission Assessment  Number of Days since last admission?: 1-7 days  Previous Disposition: Home with Family  Who is being Interviewed: Caregiver  What was the patient's/caregiver's perception as to why they think they needed to return back to the hospital?: Other (Comment) (patient developed new sx)  Did you visit your Primary Care Physician after you left the hospital, before you returned this time?: Yes [facility])  Did you see a specialist, such as Cardiac, Pulmonary, Orthopedic Physician, etc. after you left the hospital?: No  Who advised the patient to return to the hospital?: Self-referral  Does the patient report anything that got in the way of taking their medications?: No  In our efforts to provide the best possible care to you and others like you,  can you think of anything that we could have done to help you after you left the hospital the first time, so that you might not have needed to return so soon?: Other (Comment) (Patient began to feel ill and returned to hospital)     ______________________  Lucilla Lame BSN, RN  Care Management  02/11/2023  11:19 AM

## 2023-02-11 NOTE — Consults (Signed)
Surgery Consult    Subjective:      Melinda [name] is a 42 y.o. female with past medical history of hypertension, diabetes, GERD, hyperlipidemia who has had recent admissions due to epigastric pain, nausea, and vomiting.  On 02/03/2023 the patient underwent cholecystectomy without any complications.  On 01/19/2023 the patient underwent a colonoscopy where only small internal hemorrhoids were noted, the rest of the colon was healthy appearing mucosa.  On 01/20/2023 the patient had a video capsule endoscopy procedure where the findings mentioned stomach to gas transit time 3 hours, benign appearing gastric antral erosion without evidence of active or recent bleeding.  She also had an EGD that shows normal-appearing esophagus,  GE junction normal-appearing stomach, and normal examined duodenum  Patient came back to the emergency room complaining of the same pain that she has had before surgery which is in the epigastric in nature associated with nausea and vomiting.  Labs on her admission she had a normal white blood cell count of 8.1, and hemoglobin of 10.2.  Her liver function tests were all within normal limits.  CT done in the emergency room showed a collection of fluid in the gallbladder fossa.  Patient reports her pain is the same as before surgery    Fluid in the gallbladder fossa aspirated today by IR.  IR note states that 5 cc of bloody fluid was sent to the lab      Past Medical History:   Diagnosis Date    High blood pressure     High cholesterol     Type 2 diabetes mellitus (HCC)     Controled     Past Surgical History:   Procedure Laterality Date    CAPSULE ENDOSCOPY N/A 01/20/2023    ESOPHAGEAL CAPSULE ENDOSCOPY remove at 1624PM performed by Glyn Ade, MD at [facility] ENDOSCOPY    CHOLECYSTECTOMY, LAPAROSCOPIC N/A 02/03/2023    ROBOTIC LAPAROSCOPIC CHOLECYSTECTOMY with Indocyanine green performed by Verdis Frederickson, MD at [facility] MAIN OR    COLONOSCOPY N/A 01/19/2023    COLONOSCOPY DIAGNOSTIC performed  by Glyn Ade, MD at [facility] ENDOSCOPY    OTHER SURGICAL HISTORY Left     Rentia attachment    TUBAL LIGATION Bilateral     UPPER GASTROINTESTINAL ENDOSCOPY N/A 01/17/2023    ESOPHAGOGASTRODUODENOSCOPY performed by Glyn Ade, MD at [facility] ENDOSCOPY    UPPER GASTROINTESTINAL ENDOSCOPY N/A 01/17/2023    ESOPHAGOGASTRODUODENOSCOPY BIOPSY performed by Glyn Ade, MD at [facility] ENDOSCOPY    UPPER GASTROINTESTINAL ENDOSCOPY N/A 01/18/2023    ESOPHAGOGASTRODUODENOSCOPY performed by Glyn Ade, MD at [facility] ENDOSCOPY      No family history on file.  Social History     Socioeconomic History    Marital status: Married   Tobacco Use    Smoking status: Never    Smokeless tobacco: Never   Vaping Use    Vaping Use: Never used   Substance and Sexual Activity    Alcohol use: Never    Drug use: Never     Social Determinants of Health     Food Insecurity: Food Insecurity Present (02/10/2023)    Hunger Vital Sign     Worried About Running Out of Food in the Last Year: Sometimes true     Ran Out of Food in the Last Year: Sometimes true   Transportation Needs: No Transportation Needs (02/10/2023)    PRAPARE - Therapist, art (Medical): No     Lack of Transportation (Non-Medical): No  Housing Stability: Low Risk  (02/10/2023)    Housing Stability Vital Sign     Unable to Pay for Housing in the Last Year: No     Number of Places Lived in the Last Year: 1     Unstable Housing in the Last Year: No      Current Facility-Administered Medications   Medication Dose Route Frequency    prochlorperazine (COMPAZINE) injection 10 mg  10 mg IntraVENous Q6H PRN    insulin NPH (HumuLIN N;NovoLIN N) injection vial 6 Units  0.1 Units/kg SubCUTAneous BID AC    metoprolol succinate (TOPROL XL) extended release tablet 25 mg  25 mg Oral Daily    sennosides-docusate sodium (SENOKOT-S) 8.6-50 MG tablet 2 tablet  2 tablet Oral Daily    metoclopramide (REGLAN) injection 5 mg  5 mg IntraVENous Q6H    pantoprazole (PROTONIX) 40 mg in sodium  chloride (PF) 0.9 % 10 mL injection  40 mg IntraVENous Q12H    ondansetron (ZOFRAN) injection 4 mg  4 mg IntraVENous Q6H PRN    glucose chewable tablet 16 g  4 tablet Oral PRN    dextrose bolus 10% 125 mL  125 mL IntraVENous PRN    Or    dextrose bolus 10% 250 mL  250 mL IntraVENous PRN    glucagon injection 1 mg  1 mg SubCUTAneous PRN    dextrose 10 % infusion   IntraVENous Continuous PRN    sodium chloride flush 0.9 % injection 5-40 mL  5-40 mL IntraVENous 2 times per day    sodium chloride flush 0.9 % injection 5-40 mL  5-40 mL IntraVENous PRN    0.9 % sodium chloride infusion   IntraVENous PRN    ondansetron (ZOFRAN-ODT) disintegrating tablet 4 mg  4 mg Oral Q8H PRN    Or    ondansetron (ZOFRAN) injection 4 mg  4 mg IntraVENous Q6H PRN    polyethylene glycol (GLYCOLAX) packet 17 g  17 g Oral Daily PRN    acetaminophen (TYLENOL) tablet 650 mg  650 mg Oral Q6H PRN    Or    acetaminophen (TYLENOL) suppository 650 mg  650 mg Rectal Q6H PRN    0.9 % sodium chloride infusion   IntraVENous Continuous    insulin lispro (HUMALOG) injection vial 0-4 Units  0-4 Units SubCUTAneous TID WC    insulin lispro (HUMALOG) injection vial 0-4 Units  0-4 Units SubCUTAneous Nightly    hydrALAZINE (APRESOLINE) injection 10 mg  10 mg IntraVENous Q6H PRN    piperacillin-tazobactam (ZOSYN) 3,375 mg in sodium chloride 0.9 % 50 mL IVPB (mini-bag)  3,375 mg IntraVENous q8h    HYDROmorphone (DILAUDID) injection 0.5 mg  0.5 mg IntraVENous Q4H PRN      No Known Allergies    Review of Systems:REVIEW OF SYSTEMS:     []      Unable to obtain  ROS due to  []     mental status change  []     sedated   []     intubated   [x]     Total of 12 systems reviewed as follows:    Constitutional: neg for fevers, chills, weight loss, malaise  Eyes: negative for blurry vision  Ears, nose, mouth, throat, and face: negative for sore throat  Respiratory: negative for SOB  Cardiovascular: negative for CP  Gastrointestinal: Positive for nausea, vomiting, abdominal pain,  denies diarrhea, constipation, melena, hematochezia  Genitourinary: negative for dysuria  Integument/breast: neg for skin rash  Hematologic/lymphatic: neg for bruising  Musculoskeletal: negative for muscle aches  Neurological: no dizziness or h/a    Objective:      Patient Vitals for the past 8 hrs:   BP Temp Temp src Pulse Resp SpO2   02/11/23 1623 (!) 155/87 97.9 F (36.6 C) Oral (!) 103 18 98 %   02/11/23 1551 -- -- -- -- 18 --   02/11/23 1432 -- -- -- -- 16 --   02/11/23 1400 (!) 185/101 -- -- -- -- --   02/11/23 1300 (!) 161/97 -- -- 98 16 92 %   02/11/23 1245 (!) 156/94 -- -- (!) 102 17 96 %   02/11/23 1235 (!) 142/93 -- -- (!) 101 17 95 %   02/11/23 1230 (!) 147/73 -- -- 97 14 94 %   02/11/23 1215 (!) 145/82 -- -- 95 11 90 %   02/11/23 1209 (!) 145/75 -- -- 94 10 (!) 83 %   02/11/23 1159 (!) 149/88 -- -- 94 13 100 %   02/11/23 1155 (!) 155/86 -- -- 97 16 98 %   02/11/23 1150 (!) 152/85 -- -- 94 15 100 %   02/11/23 1145 (!) 142/89 -- -- 93 13 100 %   02/11/23 1140 (!) 141/87 -- -- 93 14 100 %   02/11/23 1135 (!) 160/93 -- -- 95 12 100 %   02/11/23 1130 (!) 146/95 -- -- 93 13 100 %   02/11/23 1125 (!) 159/86 -- -- 94 18 99 %   02/11/23 1120 (!) 148/91 -- -- 94 15 99 %   02/11/23 1115 (!) 174/92 -- -- 99 12 100 %   02/11/23 1110 (!) 173/94 -- -- 99 13 100 %   02/11/23 1104 (!) 172/97 -- -- 97 10 --   02/11/23 1100 (!) 177/94 -- -- 97 12 --   02/11/23 1026 (!) 201/99 98.1 F (36.7 C) Oral 97 18 95 %       Temp (24hrs), Avg:98 F (36.7 C), Min:97.9 F (36.6 C), Max:98.1 F (36.7 C)      Physical Exam:  General:  Alert, cooperative, no distress, appears stated age.   Eyes:  Conjunctivae/corneas clear.    Nose: Nares normal. Septum midline   Mouth/Throat: Lips, mucosa, and tongue normal.    Neck: Supple, symmetrical, trachea midline   Lungs:   Clear to auscultation bilaterally.   Heart:  Regular rate and rhythm   Abdomen:   Soft, very tender in the epigastric area.  Minimal to no tenderness in the right upper  quadrant.  All incisions are clean dry and intact   Extremities: Extremities normal, atraumatic, no cyanosis or edema.   Skin: Skin color, texture, turgor normal. No rashes or lesions   Neuro: Alert, oriented, speech clear     Labs:   Recent Labs     02/11/23  0348   WBC 13.7*   HGB 9.5*   HCT 29.8*   PLT 485*     Recent Labs     02/10/23  1218 02/10/23  1320 02/11/23  0348   NA 139  --  141   K 3.2*  --  3.5   CL 109*  --  104   CO2 22  --  29   BUN 16  --  19   MG  --    < > 2.3   ALT 33  --   --     < > = values in this interval not displayed.     No  results for input(s): "INR" in the last 72 hours.      Assessment and Plan:     42 year old female with epigastric pain that she has had for some time now associated with nausea and vomiting.  She reports that her pain is exactly the same as intense as before surgery.  Since IR aspirated 5 cc of bloody fluid this is more consistent with just serosanguineous drainage from surgery.  Since the patient's LFTs were within normal limits as well as her white blood cell count when she came in and the fluid is serosanguineous extremely low suspicion for biliary leak or an abscess  Recommend reconsulting GI  Recommend gastric emptying study      Signed By: Nancee Liter, MD     Feb 11, 2023

## 2023-02-11 NOTE — Progress Notes (Signed)
[facility]  [address] [place], [place] [address]  [phone]        Hospitalist Progress Note      NAME: Melinda [name]   DOB:  [birth year]  MRM:  [id number]    Date/Time: 02/11/2023  6:18 PM         Subjective:     Chief Complaint:  "I have a lot of pain"     Pt seen and examined after US drainage of fluid. Pain remains epigastric 10/10 nonradiating with intractable N/V. Discussed with daughter possibility of gastroparesis considering repeat CT showing no acute process, long standing DM, symptomatology and recent EGD/capsule study and colonoscopy without acute process.     ROS:  (bold if positive, if negative)    Ab pain   N/V   Constipation        Objective:       Vitals:          Last 24hrs VS reviewed since prior progress note. Most recent are:    Vitals:    02/11/23 1652   BP:    Pulse: (!) 116   Resp:    Temp:    SpO2:      SpO2 Readings from Last 6 Encounters:   02/11/23 98%   02/04/23 97%   01/20/23 98%   06/29/22 100%   05/15/22 98%   02/16/22 99%          Intake/Output Summary (Last 24 hours) at 02/11/2023 1818  Last data filed at 02/11/2023 0734  Gross per 24 hour   Intake 0 ml   Output --   Net 0 ml          Exam:     Physical Exam:    Gen: Moderate distress 2/2 pain   HEENT:  Pink conjunctivae, PERRL, hearing intact to voice, moist mucous membranes  Neck:  Supple, without masses, thyroid non-tender  Resp:  No accessory muscle use, clear breath sounds without wheezes rales or rhonchi  Card: tachycardic. No murmurs, normal S1, S2 without thrills, bruits or peripheral edema  Abd: epigastric tenderness. Not an acute abdomen   Musc:  No cyanosis or clubbing  Skin:  No rashes or ulcers, skin turgor is good  Neuro:  Cranial nerves 3-12 are grossly intact, grip strength is 5/5 bilaterally and dorsi / plantarflexion is 5/5 bilaterally, follows commands appropriately  Psych:  Good insight, oriented to person, place and time, alert    Medications Reviewed: (see below)    Lab  Data Reviewed: (see below)    ______________________________________________________________________    Medications:     Current Facility-Administered Medications   Medication Dose Route Frequency    prochlorperazine (COMPAZINE) injection 10 mg  10 mg IntraVENous Q6H PRN    insulin NPH (HumuLIN N;NovoLIN N) injection vial 6 Units  0.1 Units/kg SubCUTAneous BID AC    [Held by provider] metoprolol succinate (TOPROL XL) extended release tablet 25 mg  25 mg Oral Daily    [Held by provider] sennosides-docusate sodium (SENOKOT-S) 8.6-50 MG tablet 2 tablet  2 tablet Oral Daily    pantoprazole (PROTONIX) 40 mg in sodium chloride (PF) 0.9 % 10 mL injection  40 mg IntraVENous Q12H    metoclopramide (REGLAN) injection 10 mg  10 mg IntraVENous Q6H    phenol 1.4 % mouth spray 1 spray  1 spray Mouth/Throat Q2H PRN    glucose chewable tablet 16 g  4 tablet Oral PRN    dextrose bolus  10% 125 mL  125 mL IntraVENous PRN    Or    dextrose bolus 10% 250 mL  250 mL IntraVENous PRN    dextrose 10 % infusion   IntraVENous Continuous PRN    insulin lispro (HUMALOG) injection vial 0-4 Units  0-4 Units SubCUTAneous Q6H    HYDROmorphone HCl PF (DILAUDID) injection 1 mg  1 mg IntraVENous Q4H PRN    docusate (COLACE) 50 MG/5ML liquid 100 mg  100 mg Per NG tube BID    metoprolol (LOPRESSOR) injection 2.5 mg  2.5 mg IntraVENous Q6H    ondansetron (ZOFRAN) injection 4 mg  4 mg IntraVENous Q6H PRN    glucose chewable tablet 16 g  4 tablet Oral PRN    dextrose bolus 10% 125 mL  125 mL IntraVENous PRN    Or    dextrose bolus 10% 250 mL  250 mL IntraVENous PRN    glucagon injection 1 mg  1 mg SubCUTAneous PRN    dextrose 10 % infusion   IntraVENous Continuous PRN    sodium chloride flush 0.9 % injection 5-40 mL  5-40 mL IntraVENous 2 times per day    sodium chloride flush 0.9 % injection 5-40 mL  5-40 mL IntraVENous PRN    0.9 % sodium chloride infusion   IntraVENous PRN    ondansetron (ZOFRAN-ODT) disintegrating tablet 4 mg  4 mg Oral Q8H PRN    Or     ondansetron (ZOFRAN) injection 4 mg  4 mg IntraVENous Q6H PRN    polyethylene glycol (GLYCOLAX) packet 17 g  17 g Oral Daily PRN    acetaminophen (TYLENOL) tablet 650 mg  650 mg Oral Q6H PRN    Or    acetaminophen (TYLENOL) suppository 650 mg  650 mg Rectal Q6H PRN    0.9 % sodium chloride infusion   IntraVENous Continuous    hydrALAZINE (APRESOLINE) injection 10 mg  10 mg IntraVENous Q6H PRN    piperacillin-tazobactam (ZOSYN) 3,375 mg in sodium chloride 0.9 % 50 mL IVPB (mini-bag)  3,375 mg IntraVENous q8h            Lab Review:     Recent Labs     02/10/23  1218 02/11/23  0348   WBC 8.1 13.7*   HGB 10.2* 9.5*   HCT 30.7* 29.8*   PLT 448* 485*     Recent Labs     02/10/23  1218 02/10/23  1320 02/11/23  0348   NA 139  --  141   K 3.2*  --  3.5   CL 109*  --  104   CO2 22  --  29   BUN 16  --  19   MG  --  2.0 2.3   ALT 33  --   --      No components found for: "GLPOC"         Assessment / Plan:   Abdominal pain - initially thought to be 2/2 post op fluid collection/infection but US guided fluid removal without evidence of infectious fluid. Repeat CT without acute process. Recent EGD without gastritis/PUD   -high suspicion for gastroparesis considering symptomatology and negative scans   -will need gastric emptying study on Monday   -pain/nausea refractory to meds; place NG tube   -continue reglan   -continue IVF's     Uncontrolled hypertension - likely 2/2 pain and nausea   -start IV metoprolol while NPO with NGT     Hypokalemia  -replete PRN  DM - A1c 8.5 and historically POORLY CONTROLLED  -on ISS   -continue low dose NPH     AKI - likely 2/2 IVVD and suspect CKD considering overt proteinuria/diabetic/hypertensive nephropathy  -IVF's   -resume ARB as Cr tolerates     Total time spent with patient: 48 Minutes                  Care Plan discussed with: Patient, Family, Nursing Staff, Consultant/Specialist, >50% of time spent in counseling and coordination of care, and      Discussed:  Care Plan    Prophylaxis:   SCD's    Disposition:  Home w/Family           ___________________________________________________    Attending Physician: Pieter Partridge, MD

## 2023-02-11 NOTE — Progress Notes (Addendum)
1015  Patient arrived via stretcher by transport with telemetry. Patient labels printed and placed on consent. S1S2, LCTA. NPO verified, VSS BP elevated 210/99, pain (10).   [id number]  Provider at bedside for consent with review of risks and benefits with use of interpreter, sesion code [id number].  1052  Patient taken via stretcher to ultrasound for drain placement. Paced on monitor and connected to oxygen/fluids. Time out 1114 Start time 1115 End time 1158. Versed 3 mg and Fentanyl 100 mcg given for sedation. Band-aide to right upper abdomen CDI. Tolerated well. 5 cc bloody fluid sent to lab  1205  Patient brought back to [facility] and placed back on monitor for recovery.  Sleeping and arouses to voice.  1230  Patient with shaking and pain at aspiration site right upper abdomen. No bleeding noted on band-aide, HR increased to 100 and states pain 10, BP stable. Provider made aware Riddell,NP with orders received for Fentanyl 50 mcg.  Medication ordered and given, pain improved. Ice chips given.   1300  Patient sent back to room via transport on tele box. Report sent perfect serve to [facility] Notified secretary patient on way back to room 314.

## 2023-02-11 NOTE — Progress Notes (Signed)
Patient/caregivers speak Spanish as their preferred language for their healthcare communication. For safe communication, use the [employer] interpreter carts or call:    Maria Claudia Oystese   Senior Interpreter-Navigator [phone]  General phone: [phone] ( [phone])  Email: [email or web address]    Please always document the use of interpreting services (Interpreter's ID number) in your clinical notes.    Our interpreters are available for team members working with limited  English proficient (LEP) patients remotely, via phone or video or in person (if needed for special cases).

## 2023-02-11 NOTE — Plan of Care (Signed)
Problem: Discharge Planning  Goal: Discharge to home or other facility with appropriate resources  02/11/2023 1126 by Carlisle Cater, RN  Outcome: Progressing  02/11/2023 0211 by Weldon Picking, RN  Outcome: Progressing     Problem: Pain  Goal: Verbalizes/displays adequate comfort level or baseline comfort level  02/11/2023 1126 by Carlisle Cater, RN  Outcome: Progressing  02/11/2023 0211 by Weldon Picking, RN  Outcome: Progressing  Note: Pt c/o pain to abd, medicated x2 with dilaudid with effective results.     Problem: ABCDS Injury Assessment  Goal: Absence of physical injury  02/11/2023 1126 by Carlisle Cater, RN  Outcome: Progressing  02/11/2023 0211 by Weldon Picking, RN  Outcome: Progressing     Problem: Chronic Conditions and Co-morbidities  Goal: Patient's chronic conditions and co-morbidity symptoms are monitored and maintained or improved  Outcome: Progressing

## 2023-02-11 NOTE — Consults (Signed)
Session ID: [id number]  Language: Spanish  Interpreter ID: #[id number]  Interpreter Name: Barbara Cower

## 2023-02-12 ENCOUNTER — Inpatient Hospital Stay: Admit: 2023-02-12 | Payer: MEDICAID | Primary: Adult Health

## 2023-02-12 LAB — BASIC METABOLIC PANEL
Anion Gap: 5 mmol/L (ref 5–15)
BUN/Creatinine Ratio: 9 — ABNORMAL LOW (ref 12–20)
BUN: 20 MG/DL (ref 6–20)
CO2: 27 mmol/L (ref 21–32)
Calcium: 7.9 MG/DL — ABNORMAL LOW (ref 8.5–10.1)
Chloride: 111 mmol/L — ABNORMAL HIGH (ref 97–108)
Creatinine: 2.33 MG/DL — ABNORMAL HIGH (ref 0.55–1.02)
Est, Glom Filt Rate: 26 mL/min/{1.73_m2} — ABNORMAL LOW (ref >60)
Glucose: 230 mg/dL — ABNORMAL HIGH (ref 65–100)
Potassium: 3.1 mmol/L — ABNORMAL LOW (ref 3.5–5.1)
Sodium: 143 mmol/L (ref 136–145)

## 2023-02-12 LAB — CBC WITH AUTO DIFFERENTIAL
Basophils %: 1 % (ref 0–1)
Basophils Absolute: 0.1 10*3/uL (ref 0.0–0.1)
Eosinophils %: 0 % (ref 0–7)
Eosinophils Absolute: 0 10*3/uL (ref 0.0–0.4)
Hematocrit: 27.6 % — ABNORMAL LOW (ref 35.0–47.0)
Hemoglobin: 8.8 g/dL — ABNORMAL LOW (ref 11.5–16.0)
Immature Granulocytes %: 1 % — ABNORMAL HIGH (ref 0.0–0.5)
Immature Granulocytes Absolute: 0.1 10*3/uL — ABNORMAL HIGH (ref 0.00–0.04)
Lymphocytes %: 11 % — ABNORMAL LOW (ref 12–49)
Lymphocytes Absolute: 1.1 10*3/uL (ref 0.8–3.5)
MCH: 25.9 PG — ABNORMAL LOW (ref 26.0–34.0)
MCHC: 31.9 g/dL (ref 30.0–36.5)
MCV: 81.2 FL (ref 80.0–99.0)
MPV: 9.2 FL (ref 8.9–12.9)
Monocytes %: 4 % — ABNORMAL LOW (ref 5–13)
Monocytes Absolute: 0.4 10*3/uL (ref 0.0–1.0)
Neutrophils %: 84 % — ABNORMAL HIGH (ref 32–75)
Neutrophils Absolute: 8.7 10*3/uL — ABNORMAL HIGH (ref 1.8–8.0)
Nucleated RBCs: 0 PER 100 WBC
Platelets: 466 10*3/uL — ABNORMAL HIGH (ref 150–400)
RBC: 3.4 M/uL — ABNORMAL LOW (ref 3.80–5.20)
RDW: 21 % — ABNORMAL HIGH (ref 11.5–14.5)
WBC: 10.4 10*3/uL (ref 3.6–11.0)
nRBC: 0 10*3/uL (ref 0.00–0.01)

## 2023-02-12 LAB — POCT GLUCOSE
POC Glucose: 152 mg/dL — ABNORMAL HIGH (ref 65–117)
POC Glucose: 165 mg/dL — ABNORMAL HIGH (ref 65–117)
POC Glucose: 183 mg/dL — ABNORMAL HIGH (ref 65–117)
POC Glucose: 211 mg/dL — ABNORMAL HIGH (ref 65–117)
POC Glucose: 224 mg/dL — ABNORMAL HIGH (ref 65–117)
POC Glucose: 252 mg/dL — ABNORMAL HIGH (ref 65–117)
POC Glucose: 288 mg/dL — ABNORMAL HIGH (ref 65–117)

## 2023-02-12 LAB — MAGNESIUM: Magnesium: 2.4 mg/dL (ref 1.6–2.4)

## 2023-02-12 MED ORDER — POLYETHYLENE GLYCOL 3350 17 G PO PACK
17 | Freq: Every day | ORAL | Status: DC
Start: 2023-02-12 — End: 2023-02-13
  Administered 2023-02-13: 01:00:00 17 g via ORAL

## 2023-02-12 MED ORDER — LORAZEPAM 0.5 MG PO TABS
0.5 | ORAL | Status: DC | PRN
Start: 2023-02-12 — End: 2023-02-11

## 2023-02-12 MED ORDER — METOPROLOL TARTRATE 5 MG/5ML IV SOLN
5 | Freq: Four times a day (QID) | INTRAVENOUS | Status: DC
Start: 2023-02-12 — End: 2023-02-12

## 2023-02-12 MED ORDER — LABETALOL HCL 5 MG/ML IV SOLN
5 | Freq: Four times a day (QID) | INTRAVENOUS | Status: DC | PRN
Start: 2023-02-12 — End: 2023-02-12
  Administered 2023-02-12 (×2): 10 mg via INTRAVENOUS

## 2023-02-12 MED ORDER — HYDRALAZINE HCL 20 MG/ML IJ SOLN
20 MG/ML | Freq: Four times a day (QID) | INTRAMUSCULAR | Status: DC | PRN
Start: 2023-02-12 — End: 2023-02-14

## 2023-02-12 MED ORDER — POTASSIUM CHLORIDE IN NACL 20-0.9 MEQ/L-% IV SOLN
INTRAVENOUS | Status: DC
Start: 2023-02-12 — End: 2023-02-12
  Administered 2023-02-12: 11:00:00 via INTRAVENOUS

## 2023-02-12 MED ORDER — LACTATED RINGERS IV SOLN
INTRAVENOUS | Status: DC
Start: 2023-02-12 — End: 2023-02-13
  Administered 2023-02-12: 18:00:00 via INTRAVENOUS

## 2023-02-12 MED ORDER — LORAZEPAM 2 MG/ML IJ SOLN
2 | INTRAMUSCULAR | Status: DC | PRN
Start: 2023-02-12 — End: 2023-02-12

## 2023-02-12 MED ORDER — HYDROMORPHONE HCL PF 1 MG/ML IJ SOLN
1 MG/ML | INTRAMUSCULAR | Status: DC | PRN
Start: 2023-02-12 — End: 2023-02-14
  Administered 2023-02-13 – 2023-02-14 (×5): 1 mg via INTRAVENOUS

## 2023-02-12 MED ORDER — LABETALOL HCL 5 MG/ML IV SOLN
5 | Freq: Four times a day (QID) | INTRAVENOUS | Status: DC | PRN
Start: 2023-02-12 — End: 2023-02-13
  Administered 2023-02-13: 10:00:00 20 mg via INTRAVENOUS

## 2023-02-12 MED ORDER — INSULIN NPH (HUMAN) (ISOPHANE) 100 UNIT/ML SC SUSP
100 | Freq: Two times a day (BID) | SUBCUTANEOUS | Status: DC
Start: 2023-02-12 — End: 2023-02-13
  Administered 2023-02-13: 12:00:00 6 [IU]/kg via SUBCUTANEOUS

## 2023-02-12 MED ORDER — FLEET ENEMA 7-19 GM/118ML RE ENEM
7-19 | Freq: Once | RECTAL | Status: AC
Start: 2023-02-12 — End: 2023-02-12
  Administered 2023-02-12: 12:00:00 1 via RECTAL

## 2023-02-12 MED ORDER — DIAZEPAM 5 MG/ML IJ SOLN
5 MG/ML | INTRAMUSCULAR | Status: DC | PRN
Start: 2023-02-12 — End: 2023-02-14
  Administered 2023-02-12 – 2023-02-13 (×2): 5 mg via INTRAVENOUS

## 2023-02-12 MED ORDER — INSULIN NPH (HUMAN) (ISOPHANE) 100 UNIT/ML SC SUSP
100 | Freq: Two times a day (BID) | SUBCUTANEOUS | Status: DC
Start: 2023-02-12 — End: 2023-02-12

## 2023-02-12 MED ORDER — METOPROLOL SUCCINATE ER 25 MG PO TB24
25 | Freq: Every day | ORAL | Status: DC
Start: 2023-02-12 — End: 2023-02-12

## 2023-02-12 MED ORDER — CLONIDINE 0.1 MG/24HR TD PTWK
0.1 | TRANSDERMAL | Status: DC
Start: 2023-02-12 — End: 2023-02-13
  Administered 2023-02-12: 23:00:00 1 via TRANSDERMAL

## 2023-02-12 MED ORDER — METOPROLOL TARTRATE 5 MG/5ML IV SOLN
5 | Freq: Four times a day (QID) | INTRAVENOUS | Status: DC
Start: 2023-02-12 — End: 2023-02-13
  Administered 2023-02-12 – 2023-02-13 (×3): 5 mg via INTRAVENOUS

## 2023-02-12 MED ORDER — POTASSIUM CHLORIDE IN NACL 40-0.9 MEQ/L-% IV SOLN
INTRAVENOUS | Status: DC
Start: 2023-02-12 — End: 2023-02-12
  Administered 2023-02-12: 12:00:00 via INTRAVENOUS

## 2023-02-12 MED FILL — METOPROLOL TARTRATE 5 MG/5ML IV SOLN: 5 MG/ML | INTRAVENOUS | Qty: 5

## 2023-02-12 MED FILL — POTASSIUM CHLORIDE IN NACL 20-0.9 MEQ/L-% IV SOLN: INTRAVENOUS | Qty: 1000

## 2023-02-12 MED FILL — METOCLOPRAMIDE HCL 5 MG/ML IJ SOLN: 5 MG/ML | INTRAMUSCULAR | Qty: 2

## 2023-02-12 MED FILL — HYDRALAZINE HCL 20 MG/ML IJ SOLN: 20 MG/ML | INTRAMUSCULAR | Qty: 1

## 2023-02-12 MED FILL — PROCHLORPERAZINE EDISYLATE 10 MG/2ML IJ SOLN: 10 MG/2ML | INTRAMUSCULAR | Qty: 2

## 2023-02-12 MED FILL — DIAZEPAM 5 MG/ML IJ SOLN: 5 MG/ML | INTRAMUSCULAR | Qty: 2

## 2023-02-12 MED FILL — ONDANSETRON HCL 4 MG/2ML IJ SOLN: 4 MG/2ML | INTRAMUSCULAR | Qty: 2

## 2023-02-12 MED FILL — HYDROMORPHONE HCL 1 MG/ML IJ SOLN: 1 MG/ML | INTRAMUSCULAR | Qty: 1

## 2023-02-12 MED FILL — DOCUSATE SODIUM 50 MG/5ML PO LIQD: 50 MG/5ML | ORAL | Qty: 10

## 2023-02-12 MED FILL — PANTOPRAZOLE SODIUM 40 MG IV SOLR: 40 MG | INTRAVENOUS | Qty: 40

## 2023-02-12 MED FILL — POTASSIUM CHLORIDE IN NACL 40-0.9 MEQ/L-% IV SOLN: INTRAVENOUS | Qty: 1000

## 2023-02-12 MED FILL — ENEMA 7-19 GM/118ML RE ENEM: 7-19 GM/118ML | RECTAL | Qty: 1

## 2023-02-12 MED FILL — LABETALOL HCL 5 MG/ML IV SOLN: 5 MG/ML | INTRAVENOUS | Qty: 4

## 2023-02-12 MED FILL — LACTATED RINGERS IV SOLN: INTRAVENOUS | Qty: 1000

## 2023-02-12 MED FILL — PIPERACILLIN SOD-TAZOBACTAM SO 3.375 (3-0.375) G IV SOLR: 3.375 (3-0.375) g | INTRAVENOUS | Qty: 3375

## 2023-02-12 MED FILL — HUMULIN N 100 UNIT/ML SC SUSP: 100 UNIT/ML | SUBCUTANEOUS | Qty: 6

## 2023-02-12 MED FILL — CLONIDINE 0.1 MG/24HR TD PTWK: 0.1 MG/24HR | TRANSDERMAL | Qty: 1

## 2023-02-12 MED FILL — LORAZEPAM 0.5 MG PO TABS: 0.5 MG | ORAL | Qty: 1

## 2023-02-12 MED FILL — INSULIN LISPRO 100 UNIT/ML IJ SOLN: 100 UNIT/ML | INTRAMUSCULAR | Qty: 2

## 2023-02-12 NOTE — Significant Event (Signed)
IV. Fluids changed from NS  to normal saline With Kcl 20 meq in each 1 liter bag) running at 125 cc/h X 1 day as potassium is 3.1 this morning

## 2023-02-12 NOTE — Progress Notes (Signed)
Inserted NG tube 50F, 60 cm external length as ordered by Dr. Welton Flakes. Air bolus/auscultation was done to check NG tube correct placement/location. Stomach contents was also seen in syringe after insertion. Patient tolerated procedure. Radiology contacted to do KUB for verification of placement.

## 2023-02-12 NOTE — Progress Notes (Signed)
No new issues, patient has family at bedside.     Vitals:    02/12/23 1037   BP:    Pulse:    Resp: 16   Temp:    SpO2:      Abdomen soft.     Lab Results   Component Value Date    WBC 10.4 02/12/2023    HGB 8.8 (L) 02/12/2023    HCT 27.6 (L) 02/12/2023    MCV 81.2 02/12/2023    PLT 466 (H) 02/12/2023     Lab Results   Component Value Date/Time    NA 143 02/12/2023 02:24 AM    K 3.1 02/12/2023 02:24 AM    CL 111 02/12/2023 02:24 AM    CO2 27 02/12/2023 02:24 AM    BUN 20 02/12/2023 02:24 AM    CREATININE 2.33 02/12/2023 02:24 AM    GLUCOSE 230 02/12/2023 02:24 AM    CALCIUM 7.9 02/12/2023 02:24 AM    LABGLOM 26 02/12/2023 02:24 AM    LABGLOM 58 02/03/2023 02:36 AM    LABGLOM >60 10/22/2021 10:00 AM      Abdominal pain s/p cholecystectomy  GI consult pending.  Will be available as needed.    Malachy Chamber, MD

## 2023-02-12 NOTE — Progress Notes (Signed)
Patient removed her NG tube for the second tine this shift. This patient is alert and oriented and was educated on the importance of the NG tube for gastric emptying. She verbalized understanding of the education. From 2330 to 0620, there was 900 ml output from NG tube suctioning. Patient is refusing another insertion at this time. On-call physician notified.

## 2023-02-12 NOTE — Plan of Care (Signed)
Problem: Discharge Planning  Goal: Discharge to home or other facility with appropriate resources  Outcome: Progressing  Flowsheets (Taken 02/11/2023 2112 by Marquette Old, RN)  Discharge to home or other facility with appropriate resources: Identify barriers to discharge with patient and caregiver     Problem: Pain  Goal: Verbalizes/displays adequate comfort level or baseline comfort level  Outcome: Progressing     Problem: ABCDS Injury Assessment  Goal: Absence of physical injury  Outcome: Progressing     Problem: Chronic Conditions and Co-morbidities  Goal: Patient's chronic conditions and co-morbidity symptoms are monitored and maintained or improved  Outcome: Progressing  Flowsheets (Taken 02/11/2023 2112 by Marquette Old, RN)  Care Plan - Patient's Chronic Conditions and Co-Morbidity Symptoms are Monitored and Maintained or Improved: Monitor and assess patient's chronic conditions and comorbid symptoms for stability, deterioration, or improvement

## 2023-02-12 NOTE — Consults (Signed)
Session ID: [id number]  Language: Spanish  Interpreter ID: #[id number]  Interpreter Name: Angelena Sole

## 2023-02-12 NOTE — ACP (Advance Care Planning) (Addendum)
Advance Care Planning     Advance Care Planning Inpatient Note  Spiritual Care Department    Today's Date: 02/12/2023  Unit: [facility] B3 INTERMEDIATE CARE UNIT    Received request from Other: Chaplain Rob .  Upon review of chart and communication with care team, patient's decision making abilities are not in question.. Patient, Child/Children, and Melinda [name]  was/were present in the room during visit.    Goals of ACP Conversation:  Discuss advance care planning documents    Health Care Decision Makers:     Summary: No decision makers have currently been made.     Advance Care Planning Documents (Patient Wishes):  We briefly discussed Healthcare Power of Armed forces training and education officer of Health Care Agent     Assessment:  Chart review. I checked in with patient's Medical Doctor named Pieter Partridge. She communicated that the patient is capable and able to complete advance medical directive (AMD). Patient is named Melinda [name]. Melinda [name]. Patient goes by the name Melinda [name]. The patient is getting married next week. Her and her daughter attend [employer] in [place] and are [employer]. I left a voicemail with [name] from [email or web address] to follow up with the patient to explain the Spanish AMD with her. I also emailed [email or web address] to provide follow up with patient in explaining a Spanish AMD. The patient would feel more comfortable if the daughter is present as well. The patient feel asleep. I left the blank Spanish AMD with patient and family to go over it. The daughter is looking forward to talking about the AMD with the interpreter. I briefed the patient's RN Nurse named Corrie Dandy about the most up to date information. All parties were appreciative in the follow up of the consult for the patient. Please contact spiritual health services for further assistance needed.     Interventions:  Provided education on documents for clarity and greater understanding  Patient and daughter  wanted to review the AMD. They are interested in speaking with a Spanish Interpreter to get a beeter understand as to what the AMD is.    Care Preferences Communicated:   No    Outcomes/Plan:  ACP Discussion: Postponed    Electronically signed by Karle Starch, Chaplain on 02/12/2023 at 10:41 AM

## 2023-02-12 NOTE — Consults (Signed)
Earney Hamburg, MD   Gastroenterology Consultation Note      Admit Date: 02/10/2023  Consult Date: 02/12/2023   I greatly appreciate your asking me to see Melinda [name], thank you very much for the opportunity to participate in her care.    Narrative Assessment and Plan     # Nausea and vomiting  # Epigastric pain  # Recent cholecystectomy  # H. pylori infection  # Iron deficiency anemia    -Suspect symptoms of nausea and vomiting due to gastroparesis in the setting of uncontrolled insulin-dependent diabetes mellitus.  Last A1c was 9.0.  Recent EGD had shown stomach full of food residuals.  -Recent EGD biopsies showed H. pylori gastritis, will eventually need treatment for this as an outpatient.  -Gastric emptying study has already been ordered by the primary team.  Will follow-up on the results.  Although she is already on IV Reglan which may alter the results of the study.  -Continue with supportive care with IV fluids, IV antiemetics as needed, IV Reglan as scheduled.  Continue with a good bowel regimen.  Is on Colace, will add MiraLAX once a day.  -Has had workup for iron deficiency anemia with EGD, colonoscopy and video capsule endoscopy with no source of GI bleeding identified.  Recommend iron supplementation, if no improvement in anemia, recommend outpatient follow-up with hematology.    Subjective:     Chief Complaint:   Epigastric pain    History of Present Illness:      42 year old female with a past medical history of insulin-dependent diabetes mellitus, iron deficiency anemia on oral iron supplementation, hyperlipidemia, chronic NSAID use for musculoskeletal pain, recent laparoscopic cholecystectomy, recent diagnosis of H.pylori infection presenting with epigastric pain , nausea and vomiting.      She had presented with symptoms of epigastric pain, nausea, vomiting and melena in early April.  EGD was performed on 01/17/2023, showed large amounts of food residuals in the  stomach.  Repeat EGD was performed on 01/18/2023 and was noted to be normal.  Colonoscopy was performed on 01/19/2023 which showed small internal hemorrhoids, otherwise normal.  No evidence of GI bleeding was noted.  Video capsule endoscopy was performed on 04/11 which was also normal, had a small bowel transit time of 3 hours and 50 minutes.      CT abdomen was performed at the time of presentation to the ER on 05/02 which showed  3.6 x 3.9 cm gallbladder fossa gas and fluid containing collection.  Status post cholecystectomy.  Ultrasound-guided fluid aspiration was performed, around 5 cc of serosanguineous fluid was removed.    History was obtained with the help of a Spanish interpreter.  She reports her symptoms of nausea, vomiting and abdominal pain never improved since the initial hospitalization.  She continued to have similar symptoms prior to the cholecystectomy and after the cholecystectomy.  She also reports symptoms of constipation.  Denies any diarrhea or hematochezia.  No hematemesis.        Labs show hemoglobin of 8.8, was 8.3 at the time of prior discharge   On 04/11.  LFTs are normal.  Lipase was normal at the time of presentation.    PCP:  Orlean Bradford, APRN - NP    Past Medical History:   Diagnosis Date    High blood pressure     High cholesterol     Type 2 diabetes mellitus (HCC)     Controled        Past Surgical History:  Procedure Laterality Date    CAPSULE ENDOSCOPY N/A 01/20/2023    ESOPHAGEAL CAPSULE ENDOSCOPY remove at 1624PM performed by Glyn Ade, MD at [facility] ENDOSCOPY    CHOLECYSTECTOMY, LAPAROSCOPIC N/A 02/03/2023    ROBOTIC LAPAROSCOPIC CHOLECYSTECTOMY with Indocyanine green performed by Verdis Frederickson, MD at [facility] MAIN OR    COLONOSCOPY N/A 01/19/2023    COLONOSCOPY DIAGNOSTIC performed by Glyn Ade, MD at [facility] ENDOSCOPY    OTHER SURGICAL HISTORY Left     Rentia attachment    TUBAL LIGATION Bilateral     UPPER GASTROINTESTINAL ENDOSCOPY N/A 01/17/2023     ESOPHAGOGASTRODUODENOSCOPY performed by Glyn Ade, MD at [facility] ENDOSCOPY    UPPER GASTROINTESTINAL ENDOSCOPY N/A 01/17/2023    ESOPHAGOGASTRODUODENOSCOPY BIOPSY performed by Glyn Ade, MD at [facility] ENDOSCOPY    UPPER GASTROINTESTINAL ENDOSCOPY N/A 01/18/2023    ESOPHAGOGASTRODUODENOSCOPY performed by Glyn Ade, MD at [facility] ENDOSCOPY       Social History     Tobacco Use    Smoking status: Never    Smokeless tobacco: Never   Substance Use Topics    Alcohol use: Never        No family history on file.     No Known Allergies         Home Medications:  Prior to Admission Medications   Prescriptions Last Dose Informant Patient Reported? Taking?   amLODIPine (NORVASC) 10 MG tablet   Yes No   Sig: Take 1 tablet by mouth daily   ferrous sulfate (IRON 325) 325 (65 Fe) MG tablet   No No   Sig: Take 1 tablet by mouth every morning (before breakfast)   insulin 70-30 (NOVOLIN 70/30) (70-30) 100 UNIT per ML injection vial   Yes No   Sig: Inject 15 Units into the skin 2 times daily   lisinopril (PRINIVIL;ZESTRIL) 40 MG tablet   Yes No   Sig: Take 1 tablet by mouth daily   metoprolol succinate (TOPROL XL) 50 MG extended release tablet   No No   Sig: Take 1 tablet by mouth daily   naloxone (NARCAN) 4 MG/0.1ML LIQD nasal spray   No No   Sig: 1 spray by Nasal route as needed for Opioid Reversal   ondansetron (ZOFRAN-ODT) 4 MG disintegrating tablet   No No   Sig: Take 1 tablet by mouth every 8 hours as needed for Nausea or Vomiting   polyethylene glycol (GLYCOLAX) 17 g packet   No No   Sig: Take 1 packet by mouth daily as needed for Constipation      Facility-Administered Medications: None       Hospital Medications:  Current Facility-Administered Medications   Medication Dose Route Frequency    labetalol (NORMODYNE;TRANDATE) injection 10 mg  10 mg IntraVENous Q6H PRN    metoprolol succinate (TOPROL XL) extended release tablet 25 mg  25 mg Oral Daily    lactated ringers IV soln infusion   IntraVENous Continuous    hydrALAZINE  (APRESOLINE) injection 20 mg  20 mg IntraVENous Q6H PRN    prochlorperazine (COMPAZINE) injection 10 mg  10 mg IntraVENous Q6H PRN    insulin NPH (HumuLIN N;NovoLIN N) injection vial 6 Units  0.1 Units/kg SubCUTAneous BID AC    pantoprazole (PROTONIX) 40 mg in sodium chloride (PF) 0.9 % 10 mL injection  40 mg IntraVENous Q12H    metoclopramide (REGLAN) injection 10 mg  10 mg IntraVENous Q6H    phenol 1.4 % mouth spray 1 spray  1 spray Mouth/Throat Q2H PRN  glucose chewable tablet 16 g  4 tablet Oral PRN    dextrose bolus 10% 125 mL  125 mL IntraVENous PRN    Or    dextrose bolus 10% 250 mL  250 mL IntraVENous PRN    dextrose 10 % infusion   IntraVENous Continuous PRN    insulin lispro (HUMALOG) injection vial 0-4 Units  0-4 Units SubCUTAneous Q6H    HYDROmorphone HCl PF (DILAUDID) injection 1 mg  1 mg IntraVENous Q4H PRN    docusate (COLACE) 50 MG/5ML liquid 100 mg  100 mg Per NG tube BID    [Held by provider] metoprolol (LOPRESSOR) injection 2.5 mg  2.5 mg IntraVENous Q6H    diazePAM (VALIUM) injection 5 mg  5 mg IntraVENous Q4H PRN    ondansetron (ZOFRAN) injection 4 mg  4 mg IntraVENous Q6H PRN    glucose chewable tablet 16 g  4 tablet Oral PRN    dextrose bolus 10% 125 mL  125 mL IntraVENous PRN    Or    dextrose bolus 10% 250 mL  250 mL IntraVENous PRN    glucagon injection 1 mg  1 mg SubCUTAneous PRN    dextrose 10 % infusion   IntraVENous Continuous PRN    sodium chloride flush 0.9 % injection 5-40 mL  5-40 mL IntraVENous 2 times per day    sodium chloride flush 0.9 % injection 5-40 mL  5-40 mL IntraVENous PRN    0.9 % sodium chloride infusion   IntraVENous PRN    ondansetron (ZOFRAN-ODT) disintegrating tablet 4 mg  4 mg Oral Q8H PRN    Or    ondansetron (ZOFRAN) injection 4 mg  4 mg IntraVENous Q6H PRN    polyethylene glycol (GLYCOLAX) packet 17 g  17 g Oral Daily PRN    acetaminophen (TYLENOL) tablet 650 mg  650 mg Oral Q6H PRN    Or    acetaminophen (TYLENOL) suppository 650 mg  650 mg Rectal Q6H PRN     piperacillin-tazobactam (ZOSYN) 3,375 mg in sodium chloride 0.9 % 50 mL IVPB (mini-bag)  3,375 mg IntraVENous q8h       Review of Systems: Admission ROS by Amarinder Burney Gauze, MD from 02/10/2023 were reviewed with the patient and changes (other than per HPI) include:       Objective:     Physical Exam:  Vitals:    02/12/23 1131   BP: (!) 197/108   Pulse: (!) 114   Resp: 15   Temp: 98.1 F (36.7 C)   SpO2: 97%     SpO2 Readings from Last 6 Encounters:   02/12/23 97%   02/04/23 97%   01/20/23 98%   06/29/22 100%   05/15/22 98%   02/16/22 99%          Intake/Output Summary (Last 24 hours) at 02/12/2023 1444  Last data filed at 02/12/2023 0608  Gross per 24 hour   Intake --   Output 1300 ml   Net -1300 ml        General: no distress, comfortable  Skin:  No rash or palpable dermatologic mass lesions  HEENT: Pupils equal, sclera anicteric, oropharynx with no gross lesions  Cardiovascular: No abnormal audible heart sounds, well perfused, no edema  Respiratory:  normal respiratory effort, no throacic deformity  GI:  , Abdomen nondistended, mild tenderness to palpation, no mass, no free fluid, no rebound or guarding.  Musculoskeletal:  No skeletal deformity nor acute arthritis noted.  Neurological:  Motor and sensory function  intact in upper extremeties  Psychiatric:  Normal affect, memory intact, appears to have insight into current illness  Lymphatic:  No cervical, supraclavicular, or periumbilic lymphadenopathy    Laboratory:    Recent Results (from the past 24 hour(s))   POCT Glucose    Collection Time: 02/11/23  4:23 PM   Result Value Ref Range    POC Glucose 165 (H) 65 - 117 mg/dL    Performed by: Graciela Husbands    POCT Glucose    Collection Time: 02/11/23  8:24 PM   Result Value Ref Range    POC Glucose 165 (H) 65 - 117 mg/dL    Performed by: Marquette Old    POCT Glucose    Collection Time: 02/11/23  9:12 PM   Result Value Ref Range    POC Glucose 183 (H) 65 - 117 mg/dL    Performed by: Arlyce Harman    POCT Glucose     Collection Time: 02/12/23 12:55 AM   Result Value Ref Range    POC Glucose 211 (H) 65 - 117 mg/dL    Performed by: Arlyce Harman    Basic Metabolic Panel    Collection Time: 02/12/23  2:24 AM   Result Value Ref Range    Sodium 143 136 - 145 mmol/L    Potassium 3.1 (L) 3.5 - 5.1 mmol/L    Chloride 111 (H) 97 - 108 mmol/L    CO2 27 21 - 32 mmol/L    Anion Gap 5 5 - 15 mmol/L    Glucose 230 (H) 65 - 100 mg/dL    BUN 20 6 - 20 MG/DL    Creatinine 6.21 (H) 0.55 - 1.02 MG/DL    Bun/Cre Ratio 9 (L) 12 - 20      Est, Glom Filt Rate 26 (L) >60 ml/min/1.11m2    Calcium 7.9 (L) 8.5 - 10.1 MG/DL   CBC with Auto Differential    Collection Time: 02/12/23  2:24 AM   Result Value Ref Range    WBC 10.4 3.6 - 11.0 K/uL    RBC 3.40 (L) 3.80 - 5.20 M/uL    Hemoglobin 8.8 (L) 11.5 - 16.0 g/dL    Hematocrit 30.8 (L) 35.0 - 47.0 %    MCV 81.2 80.0 - 99.0 FL    MCH 25.9 (L) 26.0 - 34.0 PG    MCHC 31.9 30.0 - 36.5 g/dL    RDW 65.7 (H) 84.6 - 14.5 %    Platelets 466 (H) 150 - 400 K/uL    MPV 9.2 8.9 - 12.9 FL    Nucleated RBCs 0.0 0 PER 100 WBC    nRBC 0.00 0.00 - 0.01 K/uL    Neutrophils % 84 (H) 32 - 75 %    Lymphocytes % 11 (L) 12 - 49 %    Monocytes % 4 (L) 5 - 13 %    Eosinophils % 0 0 - 7 %    Basophils % 1 0 - 1 %    Immature Granulocytes % 1 (H) 0.0 - 0.5 %    Neutrophils Absolute 8.7 (H) 1.8 - 8.0 K/UL    Lymphocytes Absolute 1.1 0.8 - 3.5 K/UL    Monocytes Absolute 0.4 0.0 - 1.0 K/UL    Eosinophils Absolute 0.0 0.0 - 0.4 K/UL    Basophils Absolute 0.1 0.0 - 0.1 K/UL    Immature Granulocytes Absolute 0.1 (H) 0.00 - 0.04 K/UL    Differential Type AUTOMATED     Magnesium  Collection Time: 02/12/23  2:24 AM   Result Value Ref Range    Magnesium 2.4 1.6 - 2.4 mg/dL   POCT Glucose    Collection Time: 02/12/23  6:57 AM   Result Value Ref Range    POC Glucose 224 (H) 65 - 117 mg/dL    Performed by: Marquette Old    POCT Glucose    Collection Time: 02/12/23  7:40 AM   Result Value Ref Range    POC Glucose 252 (H) 65 - 117 mg/dL    Performed  by: Illene Labrador    POCT Glucose    Collection Time: 02/12/23 11:32 AM   Result Value Ref Range    POC Glucose 288 (H) 65 - 117 mg/dL    Performed by: Illene Labrador          Assessment/Plan:     Principal Problem:    Abdominal pain  Resolved Problems:    * No resolved hospital problems. *       See above narrative for full detail.

## 2023-02-12 NOTE — Progress Notes (Signed)
Spiritual Care Assessment/Progress Note  [facility]    Name: Melinda [name] MRN: [id number]    Age: 42 y.o.     Sex: female   Language: Spanish     Date: 02/12/2023            Total Time Calculated: 92 min              Spiritual Assessment begun in [facility] B3 INTERMEDIATE CARE UNIT  Service Provided For: Patient, Family  Referral/Consult From: Other chaplain  Encounter Overview/Reason: Advance Care Planning    Spiritual beliefs:      [x]  Involved in a faith tradition/spiritual practice: Catholic     [x]  Supported by a faith community: Psychiatrist [employer]     []  Claims no spiritual orientation:      []  Seeking spiritual identity:           []  Adheres to an individual form of spirituality:      []  Not able to assess:                Identified resources for coping and support system:   Support System: Children, Significant other       []  Prayer                  []  Devotional reading               []  Music                  []  Guided Imagery     []  Pet visits                                        []  Other: (COMMENT)     Specific area/focus of visit   Encounter:    Crisis:    Spiritual/Emotional needs: Type: Spiritual Support  Ritual, Rites and Sacraments:    Grief, Loss, and Adjustments:    Ethics/Mediation:    Behavioral Health:    Palliative Care:    Advance Care Planning: Type: ACP conversation    Plan/Referrals: Other (Comment) (Contact spiritual health services for further assistance needed.)    Narrative: Chart review. I checked in with patient's Medical Doctor named Pieter Partridge. She communicated that the patient is capable and able to complete advance medical directive (AMD). Patient is named Melinda [name]. Melinda [name]. Patient goes by the name Melinda [name]. The patient is getting married next week. Her and her daughter attend [employer] in [place] and are [employer]. I left a voicemail with [name] from [email or web address] to follow up with the patient to  explain the Spanish AMD with her. I also emailed [email or web address] to provide follow up with patient in explaining a Spanish AMD. The patient would feel more comfortable if the daughter is present as well. The patient feel asleep. I left the blank Spanish AMD with patient and family to go over it. The daughter is looking forward to talking about the AMD with the interpreter. I briefed the patient's RN Nurse named Corrie Dandy about the most up to date information. All parties were appreciative in the follow up of the consult for the patient. Please contact spiritual health services for further assistance needed.     Karle Starch, MDiv  Staff [facility] Services  Paging Service [phone] (PRAY)

## 2023-02-12 NOTE — Progress Notes (Signed)
[place] [facility]  [address] [place], [place] [address]  [phone]        Hospitalist Progress Note      NAME: Melinda [name]   DOB:  1981-09-15  MRM:  [id number]    Date/Time: 02/12/2023  2:50 PM         Subjective:     Chief Complaint:  "I am a little better"     Pt had NGT placed on 5/3 PM with nearly 800cc output but then came out when pt vomited overnight. This AM still with nausea and pain but reports "30% better". Having BM's today     ROS:  (bold if positive, if negative)    Ab pain   N/V   Constipation        Objective:       Vitals:          Last 24hrs VS reviewed since prior progress note. Most recent are:    Vitals:    02/12/23 1131   BP: (!) 197/108   Pulse: (!) 114   Resp: 15   Temp: 98.1 F (36.7 C)   SpO2: 97%     SpO2 Readings from Last 6 Encounters:   02/12/23 97%   02/04/23 97%   01/20/23 98%   06/29/22 100%   05/15/22 98%   02/16/22 99%          Intake/Output Summary (Last 24 hours) at 02/12/2023 1450  Last data filed at 02/12/2023 0608  Gross per 24 hour   Intake --   Output 1300 ml   Net -1300 ml          Exam:     Physical Exam:    Gen: Moderate distress 2/2 pain   HEENT:  Pink conjunctivae, PERRL, hearing intact to voice, moist mucous membranes  Neck:  Supple, without masses, thyroid non-tender  Resp:  No accessory muscle use, clear breath sounds without wheezes rales or rhonchi  Card: tachycardic. No murmurs, normal S1, S2 without thrills, bruits or peripheral edema  Abd: epigastric tenderness. Not an acute abdomen   Musc:  No cyanosis or clubbing  Skin:  No rashes or ulcers, skin turgor is good  Neuro:  Cranial nerves 3-12 are grossly intact, grip strength is 5/5 bilaterally and dorsi / plantarflexion is 5/5 bilaterally, follows commands appropriately  Psych:  Good insight, oriented to person, place and time, alert    Medications Reviewed: (see below)    Lab Data Reviewed: (see  below)    ______________________________________________________________________    Medications:     Current Facility-Administered Medications   Medication Dose Route Frequency    metoprolol succinate (TOPROL XL) extended release tablet 25 mg  25 mg Oral Daily    lactated ringers IV soln infusion   IntraVENous Continuous    hydrALAZINE (APRESOLINE) injection 20 mg  20 mg IntraVENous Q6H PRN    labetalol (NORMODYNE;TRANDATE) injection 20 mg  20 mg IntraVENous Q6H PRN    insulin NPH (HumuLIN N;NovoLIN N) injection vial 9 Units  0.15 Units/kg SubCUTAneous BID AC    prochlorperazine (COMPAZINE) injection 10 mg  10 mg IntraVENous Q6H PRN    pantoprazole (PROTONIX) 40 mg in sodium chloride (PF) 0.9 % 10 mL injection  40 mg IntraVENous Q12H    metoclopramide (REGLAN) injection 10 mg  10 mg IntraVENous Q6H    phenol 1.4 % mouth spray 1 spray  1 spray Mouth/Throat Q2H PRN    glucose chewable tablet 16  g  4 tablet Oral PRN    dextrose bolus 10% 125 mL  125 mL IntraVENous PRN    Or    dextrose bolus 10% 250 mL  250 mL IntraVENous PRN    dextrose 10 % infusion   IntraVENous Continuous PRN    insulin lispro (HUMALOG) injection vial 0-4 Units  0-4 Units SubCUTAneous Q6H    HYDROmorphone HCl PF (DILAUDID) injection 1 mg  1 mg IntraVENous Q4H PRN    docusate (COLACE) 50 MG/5ML liquid 100 mg  100 mg Per NG tube BID    [Held by provider] metoprolol (LOPRESSOR) injection 2.5 mg  2.5 mg IntraVENous Q6H    diazePAM (VALIUM) injection 5 mg  5 mg IntraVENous Q4H PRN    ondansetron (ZOFRAN) injection 4 mg  4 mg IntraVENous Q6H PRN    glucose chewable tablet 16 g  4 tablet Oral PRN    dextrose bolus 10% 125 mL  125 mL IntraVENous PRN    Or    dextrose bolus 10% 250 mL  250 mL IntraVENous PRN    glucagon injection 1 mg  1 mg SubCUTAneous PRN    dextrose 10 % infusion   IntraVENous Continuous PRN    sodium chloride flush 0.9 % injection 5-40 mL  5-40 mL IntraVENous 2 times per day    sodium chloride flush 0.9 % injection 5-40 mL  5-40 mL  IntraVENous PRN    0.9 % sodium chloride infusion   IntraVENous PRN    ondansetron (ZOFRAN-ODT) disintegrating tablet 4 mg  4 mg Oral Q8H PRN    Or    ondansetron (ZOFRAN) injection 4 mg  4 mg IntraVENous Q6H PRN    polyethylene glycol (GLYCOLAX) packet 17 g  17 g Oral Daily PRN    acetaminophen (TYLENOL) tablet 650 mg  650 mg Oral Q6H PRN    Or    acetaminophen (TYLENOL) suppository 650 mg  650 mg Rectal Q6H PRN    piperacillin-tazobactam (ZOSYN) 3,375 mg in sodium chloride 0.9 % 50 mL IVPB (mini-bag)  3,375 mg IntraVENous q8h            Lab Review:     Recent Labs     02/10/23  1218 02/11/23  0348 02/12/23  0224   WBC 8.1 13.7* 10.4   HGB 10.2* 9.5* 8.8*   HCT 30.7* 29.8* 27.6*   PLT 448* 485* 466*     Recent Labs     02/10/23  1218 02/10/23  1320 02/11/23  0348 02/12/23  0224   NA 139  --  141 143   K 3.2*  --  3.5 3.1*   CL 109*  --  104 111*   CO2 22  --  29 27   BUN 16  --  19 20   MG  --  2.0 2.3 2.4   ALT 33  --   --   --      No components found for: "GLPOC"         Assessment / Plan:   Abdominal pain - initially thought to be 2/2 post op fluid collection/infection but US guided fluid removal without evidence of infectious fluid. Repeat CT without acute process. Recent EGD without gastritis/PUD   -high suspicion for gastroparesis considering symptomatology and negative scans   -will need gastric emptying study on Monday   -NGT placed but fell out when patient vomited; consider replacing    -continue reglan for now   -continue IVF's   -GI  eval     Uncontrolled hypertension - likely 2/2 pain and nausea   -continue metoprolol  -PRN hydralazine, labetolol    Hypokalemia  -replete in IVF's     DM - A1c 8.5 and historically POORLY CONTROLLED  -on ISS   -continue low dose NPH; increase slightly    AKI - likely 2/2 IVVD and suspect CKD considering overt proteinuria/diabetic/hypertensive nephropathy  -IVF's   -resume ARB as Cr tolerates     Total time spent with patient: 78 Minutes                  Care Plan  discussed with: Patient, Family, Nursing Staff, Consultant/Specialist, >50% of time spent in counseling and coordination of care, and      Discussed:  Care Plan    Prophylaxis:  SCD's    Disposition:  Home w/Family           ___________________________________________________    Attending Physician: Pieter Partridge, MD

## 2023-02-13 LAB — BASIC METABOLIC PANEL
Anion Gap: 6 mmol/L (ref 5–15)
BUN/Creatinine Ratio: 8 — ABNORMAL LOW (ref 12–20)
BUN: 17 MG/DL (ref 6–20)
CO2: 25 mmol/L (ref 21–32)
Calcium: 8.4 MG/DL — ABNORMAL LOW (ref 8.5–10.1)
Chloride: 111 mmol/L — ABNORMAL HIGH (ref 97–108)
Creatinine: 2.06 MG/DL — ABNORMAL HIGH (ref 0.55–1.02)
Est, Glom Filt Rate: 31 mL/min/{1.73_m2} — ABNORMAL LOW (ref >60)
Glucose: 270 mg/dL — ABNORMAL HIGH (ref 65–100)
Potassium: 3.6 mmol/L (ref 3.5–5.1)
Sodium: 142 mmol/L (ref 136–145)

## 2023-02-13 LAB — CBC WITH AUTO DIFFERENTIAL
Basophils %: 1 % (ref 0–1)
Basophils Absolute: 0.1 10*3/uL (ref 0.0–0.1)
Eosinophils %: 0 % (ref 0–7)
Eosinophils Absolute: 0 10*3/uL (ref 0.0–0.4)
Hematocrit: 28.1 % — ABNORMAL LOW (ref 35.0–47.0)
Hemoglobin: 8.8 g/dL — ABNORMAL LOW (ref 11.5–16.0)
Immature Granulocytes %: 1 % — ABNORMAL HIGH (ref 0.0–0.5)
Immature Granulocytes Absolute: 0.1 10*3/uL — ABNORMAL HIGH (ref 0.00–0.04)
Lymphocytes %: 12 % (ref 12–49)
Lymphocytes Absolute: 1.5 10*3/uL (ref 0.8–3.5)
MCH: 25.4 PG — ABNORMAL LOW (ref 26.0–34.0)
MCHC: 31.3 g/dL (ref 30.0–36.5)
MCV: 81.2 FL (ref 80.0–99.0)
MPV: 9.1 FL (ref 8.9–12.9)
Monocytes %: 4 % — ABNORMAL LOW (ref 5–13)
Monocytes Absolute: 0.5 10*3/uL (ref 0.0–1.0)
Neutrophils %: 82 % — ABNORMAL HIGH (ref 32–75)
Neutrophils Absolute: 10 10*3/uL — ABNORMAL HIGH (ref 1.8–8.0)
Nucleated RBCs: 0 PER 100 WBC
Platelets: 426 10*3/uL — ABNORMAL HIGH (ref 150–400)
RBC: 3.46 M/uL — ABNORMAL LOW (ref 3.80–5.20)
RDW: 21.1 % — ABNORMAL HIGH (ref 11.5–14.5)
WBC: 12.2 10*3/uL — ABNORMAL HIGH (ref 3.6–11.0)
nRBC: 0 10*3/uL (ref 0.00–0.01)

## 2023-02-13 LAB — MAGNESIUM: Magnesium: 2.1 mg/dL (ref 1.6–2.4)

## 2023-02-13 LAB — POCT GLUCOSE
POC Glucose: 155 mg/dL — ABNORMAL HIGH (ref 65–117)
POC Glucose: 243 mg/dL — ABNORMAL HIGH (ref 65–117)
POC Glucose: 254 mg/dL — ABNORMAL HIGH (ref 65–117)
POC Glucose: 301 mg/dL — ABNORMAL HIGH (ref 65–117)

## 2023-02-13 MED ORDER — METOPROLOL SUCCINATE ER 50 MG PO TB24
50 MG | Freq: Every day | ORAL | Status: DC
Start: 2023-02-13 — End: 2023-02-14
  Administered 2023-02-14: 12:00:00 50 mg via ORAL

## 2023-02-13 MED ORDER — INSULIN NPH (HUMAN) (ISOPHANE) 100 UNIT/ML SC SUSP
100 UNIT/ML | Freq: Two times a day (BID) | SUBCUTANEOUS | Status: DC
Start: 2023-02-13 — End: 2023-02-14
  Administered 2023-02-14 (×2): 9 [IU]/kg via SUBCUTANEOUS

## 2023-02-13 MED ORDER — METOPROLOL TARTRATE 5 MG/5ML IV SOLN
5 | Freq: Four times a day (QID) | INTRAVENOUS | Status: DC
Start: 2023-02-13 — End: 2023-02-13
  Administered 2023-02-13: 14:00:00 10 mg via INTRAVENOUS

## 2023-02-13 MED ORDER — METOPROLOL SUCCINATE ER 25 MG PO TB24
25 | Freq: Once | ORAL | Status: AC
Start: 2023-02-13 — End: 2023-02-13
  Administered 2023-02-13: 17:00:00 25 mg via ORAL

## 2023-02-13 MED ORDER — INSULIN LISPRO 100 UNIT/ML IJ SOLN
100 UNIT/ML | Freq: Four times a day (QID) | INTRAMUSCULAR | Status: DC
Start: 2023-02-13 — End: 2023-02-14
  Administered 2023-02-13: 17:00:00 2 [IU] via SUBCUTANEOUS
  Administered 2023-02-13: 22:00:00 3 [IU] via SUBCUTANEOUS
  Administered 2023-02-14: 04:00:00 1 [IU] via SUBCUTANEOUS

## 2023-02-13 MED ORDER — METOPROLOL TARTRATE 5 MG/5ML IV SOLN
5 | Freq: Once | INTRAVENOUS | Status: AC
Start: 2023-02-13 — End: 2023-02-13
  Administered 2023-02-13: 12:00:00 5 mg via INTRAVENOUS

## 2023-02-13 MED ORDER — ALPRAZOLAM 0.5 MG PO TABS
0.5 MG | ORAL | Status: DC | PRN
Start: 2023-02-13 — End: 2023-02-14
  Administered 2023-02-13 – 2023-02-14 (×2): 0.5 mg via ORAL

## 2023-02-13 MED ORDER — AMLODIPINE BESYLATE 5 MG PO TABS
5 MG | Freq: Every day | ORAL | Status: DC
Start: 2023-02-13 — End: 2023-02-14
  Administered 2023-02-13 – 2023-02-14 (×2): 10 mg via ORAL

## 2023-02-13 MED ORDER — METOCLOPRAMIDE HCL 10 MG PO TABS
10 MG | Freq: Four times a day (QID) | ORAL | Status: DC
Start: 2023-02-13 — End: 2023-02-14
  Administered 2023-02-13 – 2023-02-14 (×3): 10 mg via ORAL

## 2023-02-13 MED ORDER — METOPROLOL SUCCINATE ER 25 MG PO TB24
25 | Freq: Every day | ORAL | Status: DC
Start: 2023-02-13 — End: 2023-02-13

## 2023-02-13 MED ORDER — CLONIDINE 0.2 MG/24HR TD PTWK
0.2 | TRANSDERMAL | Status: DC
Start: 2023-02-13 — End: 2023-02-13
  Administered 2023-02-13: 14:00:00 1 via TRANSDERMAL

## 2023-02-13 MED ORDER — PANTOPRAZOLE SODIUM 40 MG PO TBEC
40 MG | Freq: Every day | ORAL | Status: DC
Start: 2023-02-13 — End: 2023-02-14
  Administered 2023-02-14: 11:00:00 40 mg via ORAL

## 2023-02-13 MED FILL — INSULIN LISPRO 100 UNIT/ML IJ SOLN: 100 UNIT/ML | INTRAMUSCULAR | Qty: 3

## 2023-02-13 MED FILL — METOPROLOL TARTRATE 5 MG/5ML IV SOLN: 5 MG/ML | INTRAVENOUS | Qty: 5

## 2023-02-13 MED FILL — PROCHLORPERAZINE EDISYLATE 10 MG/2ML IJ SOLN: 10 MG/2ML | INTRAMUSCULAR | Qty: 2

## 2023-02-13 MED FILL — HUMULIN N 100 UNIT/ML SC SUSP: 100 UNIT/ML | SUBCUTANEOUS | Qty: 6

## 2023-02-13 MED FILL — METOCLOPRAMIDE HCL 5 MG/ML IJ SOLN: 5 MG/ML | INTRAMUSCULAR | Qty: 2

## 2023-02-13 MED FILL — POLYETHYLENE GLYCOL 3350 17 G PO PACK: 17 g | ORAL | Qty: 2

## 2023-02-13 MED FILL — METOCLOPRAMIDE HCL 10 MG PO TABS: 10 MG | ORAL | Qty: 1

## 2023-02-13 MED FILL — ONDANSETRON HCL 4 MG/2ML IJ SOLN: 4 MG/2ML | INTRAMUSCULAR | Qty: 2

## 2023-02-13 MED FILL — AMLODIPINE BESYLATE 5 MG PO TABS: 5 MG | ORAL | Qty: 2

## 2023-02-13 MED FILL — LABETALOL HCL 5 MG/ML IV SOLN: 5 MG/ML | INTRAVENOUS | Qty: 4

## 2023-02-13 MED FILL — METOPROLOL SUCCINATE ER 25 MG PO TB24: 25 MG | ORAL | Qty: 1

## 2023-02-13 MED FILL — DIAZEPAM 5 MG/ML IJ SOLN: 5 MG/ML | INTRAMUSCULAR | Qty: 2

## 2023-02-13 MED FILL — INSULIN LISPRO 100 UNIT/ML IJ SOLN: 100 UNIT/ML | INTRAMUSCULAR | Qty: 2

## 2023-02-13 MED FILL — METOPROLOL TARTRATE 5 MG/5ML IV SOLN: 5 MG/ML | INTRAVENOUS | Qty: 10

## 2023-02-13 MED FILL — HYDROMORPHONE HCL 1 MG/ML IJ SOLN: 1 MG/ML | INTRAMUSCULAR | Qty: 1

## 2023-02-13 MED FILL — CLONIDINE 0.2 MG/24HR TD PTWK: 0.2 MG/24HR | TRANSDERMAL | Qty: 1

## 2023-02-13 MED FILL — POLYETHYLENE GLYCOL 3350 17 G PO PACK: 17 g | ORAL | Qty: 1

## 2023-02-13 MED FILL — ALPRAZOLAM 0.5 MG PO TABS: 0.5 MG | ORAL | Qty: 1

## 2023-02-13 MED FILL — PANTOPRAZOLE SODIUM 40 MG IV SOLR: 40 MG | INTRAVENOUS | Qty: 40

## 2023-02-13 NOTE — Progress Notes (Signed)
W. Boyce Medici, MD  [phone] office       Gastroenterology Progress Note    Feb 13, 2023  Admit Date: 02/10/2023         Narrative Assessment and Plan   # Nausea and vomiting  # Epigastric pain  # Recent cholecystectomy  # H. pylori infection  # Iron deficiency anemia    She is feeling better today without any significant abdominal pain. Would slowly advance her diet as tolerated, starting with clear liquids. I encouraged her to drink small amounts at a time. A gastric emptying study is reasonable, but this could also be done as an outpatient if she is improved. She will need treatment for H. Pylori once she is tolerating oral intake. GI will follow.     Subjective:   She feels better today. No vomiting or any significant abdominal pain. Breathing is stable. No fever. ROS otherwise negative.     Current Medications:     Current Facility-Administered Medications   Medication Dose Route Frequency    metoprolol succinate (TOPROL XL) extended release tablet 25 mg  25 mg Oral Daily    amLODIPine (NORVASC) tablet 10 mg  10 mg Oral Daily    insulin lispro (HUMALOG) injection vial 0-4 Units  0-4 Units SubCUTAneous 4x Daily AC & HS    ALPRAZolam (XANAX) tablet 0.5 mg  0.5 mg Oral Q4H PRN    hydrALAZINE (APRESOLINE) injection 20 mg  20 mg IntraVENous Q6H PRN    insulin NPH (HumuLIN N;NovoLIN N) injection vial 6 Units  0.1 Units/kg SubCUTAneous BID    HYDROmorphone HCl PF (DILAUDID) injection 1 mg  1 mg IntraVENous Q3H PRN    prochlorperazine (COMPAZINE) injection 10 mg  10 mg IntraVENous Q6H PRN    pantoprazole (PROTONIX) 40 mg in sodium chloride (PF) 0.9 % 10 mL injection  40 mg IntraVENous Q12H    metoclopramide (REGLAN) injection 10 mg  10 mg IntraVENous Q6H    phenol 1.4 % mouth spray 1 spray  1 spray Mouth/Throat Q2H PRN    glucose chewable tablet 16 g  4 tablet Oral PRN    dextrose bolus 10% 125 mL  125 mL IntraVENous PRN    Or    dextrose bolus 10% 250 mL  250 mL IntraVENous PRN    dextrose  10 % infusion   IntraVENous Continuous PRN    diazePAM (VALIUM) injection 5 mg  5 mg IntraVENous Q4H PRN    ondansetron (ZOFRAN) injection 4 mg  4 mg IntraVENous Q6H PRN    glucose chewable tablet 16 g  4 tablet Oral PRN    dextrose bolus 10% 125 mL  125 mL IntraVENous PRN    Or    dextrose bolus 10% 250 mL  250 mL IntraVENous PRN    glucagon injection 1 mg  1 mg SubCUTAneous PRN    dextrose 10 % infusion   IntraVENous Continuous PRN    sodium chloride flush 0.9 % injection 5-40 mL  5-40 mL IntraVENous 2 times per day    sodium chloride flush 0.9 % injection 5-40 mL  5-40 mL IntraVENous PRN    0.9 % sodium chloride infusion   IntraVENous PRN    ondansetron (ZOFRAN-ODT) disintegrating tablet 4 mg  4 mg Oral Q8H PRN    Or    ondansetron (ZOFRAN) injection 4 mg  4 mg IntraVENous Q6H PRN    acetaminophen (TYLENOL) tablet 650 mg  650 mg Oral Q6H PRN    Or  acetaminophen (TYLENOL) suppository 650 mg  650 mg Rectal Q6H PRN       Objective:     VITALS:   Last 24hrs VS reviewed since prior progress note. Most recent are:  Vitals:    02/13/23 1059   BP:    Pulse:    Resp: 14   Temp:    SpO2:      Temp (24hrs), Avg:98.5 F (36.9 C), Min:98.1 F (36.7 C), Max:99 F (37.2 C)      Intake/Output Summary (Last 24 hours) at 02/13/2023 1104  Last data filed at 02/12/2023 2030  Gross per 24 hour   Intake 130 ml   Output 300 ml   Net -170 ml       EXAM:  General: no distress, appears comfortable  Skin:  No rash or jaundice  HEENT: Pupils equal, sclera anicteric  Cardiovascular: Regular, well perfused, no edema  Respiratory:  Clear bilaterally, normal respiratory effort  GI:  Abdomen soft, nondistended, nontender  Musculoskeletal:  No skeletal deformity nor acute arthritis noted.  Neurological:  Non-focal, motor and sensory function intact in upper extremities  Psychiatric:  Normal affect, memory intact, appears to have insight into current illness    REVIEW OF SYSTEMS  Full ROS was done and was negative except as noted in the  HPI.    Lab Data Reviewed:   Recent Labs     02/11/23  0348 02/12/23  0224 02/13/23  0411   WBC 13.7* 10.4 12.2*   HGB 9.5* 8.8* 8.8*   HCT 29.8* 27.6* 28.1*   PLT 485* 466* 426*     Recent Labs     02/10/23  1218 02/10/23  1320 02/11/23  0348 02/12/23  0224 02/13/23  0411   NA 139  --  141 143 142   K 3.2*  --  3.5 3.1* 3.6   CL 109*  --  104 111* 111*   CO2 22  --  29 27 25    BUN 16  --  19 20 17    MG  --    < > 2.3 2.4 2.1   ALT 33  --   --   --   --     < > = values in this interval not displayed.     No results found for: "GLUCPOC"  No results for input(s): "PH", "PCO2", "PO2", "HCO3", "FIO2" in the last 72 hours.  No results for input(s): "INR" in the last 72 hours.        Assessment:   (See above)  Principal Problem:    Abdominal pain  Resolved Problems:    * No resolved hospital problems. *      Plan:   (See above)      Signed By: Candie Echevaria, MD     02/13/2023  11:04 AM

## 2023-02-13 NOTE — Progress Notes (Signed)
[place] [facility]  [address] [place], [place] [address]  [phone]        Hospitalist Progress Note      NAME: Kasinda Kurtzer   DOB:  26-Jul-1981  MRM:  [id number]    Date/Time: 02/13/2023  1:03 PM         Subjective:     Chief Complaint:  "I am ok"     Pt seen and examined. Stooling readily. Ab pain improved. No vomiting. Nausea improving. Requesting diet initiation     ROS:  (bold if positive, if negative)    Ab pain   N/V   Constipation resolved        Objective:       Vitals:          Last 24hrs VS reviewed since prior progress note. Most recent are:    Vitals:    02/13/23 1215   BP: (!) 173/85   Pulse: 94   Resp: 16   Temp: 98.1 F (36.7 C)   SpO2: 94%     SpO2 Readings from Last 6 Encounters:   02/13/23 94%   02/04/23 97%   01/20/23 98%   06/29/22 100%   05/15/22 98%   02/16/22 99%          Intake/Output Summary (Last 24 hours) at 02/13/2023 1303  Last data filed at 02/12/2023 2030  Gross per 24 hour   Intake 130 ml   Output 300 ml   Net -170 ml          Exam:     Physical Exam:    Gen: NAD   HEENT:  Pink conjunctivae, PERRL, hearing intact to voice, moist mucous membranes  Neck:  Supple, without masses, thyroid non-tender  Resp:  No accessory muscle use, clear breath sounds without wheezes rales or rhonchi  Card: tachycardic. No murmurs, normal S1, S2 without thrills, bruits or peripheral edema  Abd: epigastric tenderness improved   Musc:  No cyanosis or clubbing  Skin:  No rashes or ulcers, skin turgor is good  Neuro:  Cranial nerves 3-12 are grossly intact, grip strength is 5/5 bilaterally and dorsi / plantarflexion is 5/5 bilaterally, follows commands appropriately  Psych:  Good insight, oriented to person, place and time, alert    Medications Reviewed: (see below)    Lab Data Reviewed: (see below)    ______________________________________________________________________    Medications:     Current Facility-Administered Medications   Medication Dose Route Frequency     metoprolol succinate (TOPROL XL) extended release tablet 25 mg  25 mg Oral Daily    amLODIPine (NORVASC) tablet 10 mg  10 mg Oral Daily    insulin lispro (HUMALOG) injection vial 0-4 Units  0-4 Units SubCUTAneous 4x Daily AC & HS    ALPRAZolam (XANAX) tablet 0.5 mg  0.5 mg Oral Q4H PRN    hydrALAZINE (APRESOLINE) injection 20 mg  20 mg IntraVENous Q6H PRN    insulin NPH (HumuLIN N;NovoLIN N) injection vial 6 Units  0.1 Units/kg SubCUTAneous BID    HYDROmorphone HCl PF (DILAUDID) injection 1 mg  1 mg IntraVENous Q3H PRN    prochlorperazine (COMPAZINE) injection 10 mg  10 mg IntraVENous Q6H PRN    pantoprazole (PROTONIX) 40 mg in sodium chloride (PF) 0.9 % 10 mL injection  40 mg IntraVENous Q12H    metoclopramide (REGLAN) injection 10 mg  10 mg IntraVENous Q6H    phenol 1.4 % mouth spray 1 spray  1  spray Mouth/Throat Q2H PRN    glucose chewable tablet 16 g  4 tablet Oral PRN    dextrose bolus 10% 125 mL  125 mL IntraVENous PRN    Or    dextrose bolus 10% 250 mL  250 mL IntraVENous PRN    dextrose 10 % infusion   IntraVENous Continuous PRN    diazePAM (VALIUM) injection 5 mg  5 mg IntraVENous Q4H PRN    ondansetron (ZOFRAN) injection 4 mg  4 mg IntraVENous Q6H PRN    glucose chewable tablet 16 g  4 tablet Oral PRN    dextrose bolus 10% 125 mL  125 mL IntraVENous PRN    Or    dextrose bolus 10% 250 mL  250 mL IntraVENous PRN    glucagon injection 1 mg  1 mg SubCUTAneous PRN    dextrose 10 % infusion   IntraVENous Continuous PRN    sodium chloride flush 0.9 % injection 5-40 mL  5-40 mL IntraVENous 2 times per day    sodium chloride flush 0.9 % injection 5-40 mL  5-40 mL IntraVENous PRN    0.9 % sodium chloride infusion   IntraVENous PRN    ondansetron (ZOFRAN-ODT) disintegrating tablet 4 mg  4 mg Oral Q8H PRN    Or    ondansetron (ZOFRAN) injection 4 mg  4 mg IntraVENous Q6H PRN    acetaminophen (TYLENOL) tablet 650 mg  650 mg Oral Q6H PRN    Or    acetaminophen (TYLENOL) suppository 650 mg  650 mg Rectal Q6H PRN             Lab Review:     Recent Labs     02/11/23  0348 02/12/23  0224 02/13/23  0411   WBC 13.7* 10.4 12.2*   HGB 9.5* 8.8* 8.8*   HCT 29.8* 27.6* 28.1*   PLT 485* 466* 426*     Recent Labs     02/11/23  0348 02/12/23  0224 02/13/23  0411   NA 141 143 142   K 3.5 3.1* 3.6   CL 104 111* 111*   CO2 29 27 25    BUN 19 20 17    MG 2.3 2.4 2.1     No components found for: "GLPOC"         Assessment / Plan:   Abdominal pain - initially thought to be 2/2 post op fluid collection/infection but US guided fluid removal without evidence of infectious fluid. Repeat CT without acute process. Recent EGD without gastritis/PUD   -high suspicion for gastroparesis considering symptomatology and negative scans   -will need gastric emptying study on Monday   -s/p NGT tube   -continue reglan for now   -GI eval appreciated    Uncontrolled hypertension - likely 2/2 pain and nausea   -continue metoprolol; uptitrate  -start amlodipine   -PRN hydralazine     Hypokalemia  -replete PRN     DM - A1c 8.5 and historically POORLY CONTROLLED  -on ISS   -continue low dose NPH; increase NPH    AKI - likely 2/2 IVVD and suspect CKD considering overt proteinuria/diabetic/hypertensive nephropathy  -monitor     H.pylori gastritis - on recent EGD biopsy   -will need quadruple therapy with bismuth, flagyl, doxy, PPI x 14 days at discharge     Total time spent with patient: 80 Minutes                  Care Plan discussed with: Patient, Family, Nursing  Staff, Consultant/Specialist, >50% of time spent in counseling and coordination of care, and      Discussed:  Care Plan    Prophylaxis:  SCD's    Disposition:  Home w/Family           ___________________________________________________    Attending Physician: Pieter Partridge, MD

## 2023-02-14 ENCOUNTER — Inpatient Hospital Stay: Admit: 2023-02-14 | Payer: MEDICAID | Primary: Adult Health

## 2023-02-14 ENCOUNTER — Inpatient Hospital Stay: Payer: MEDICAID | Primary: Adult Health

## 2023-02-14 LAB — URINE DRUG SCREEN
Amphetamine, Urine: NEGATIVE
Barbiturates, Urine: NEGATIVE
Benzodiazepines, Urine: POSITIVE — AB
Cocaine, Urine: NEGATIVE
Methadone, Urine: NEGATIVE
Opiates, Urine: POSITIVE — AB
Phencyclidine, Urine: NEGATIVE
THC, TH-Cannabinol, Urine: NEGATIVE

## 2023-02-14 LAB — POCT GLUCOSE
POC Glucose: 204 mg/dL — ABNORMAL HIGH (ref 65–117)
POC Glucose: 141 mg/dL — ABNORMAL HIGH (ref 65–117)
POC Glucose: 116 mg/dL (ref 65–117)

## 2023-02-14 LAB — SODIUM, URINE, RANDOM: SODIUM, RANDOM URINE: 76 MMOL/L

## 2023-02-14 LAB — URINE CULTURE HOLD SAMPLE

## 2023-02-14 LAB — BASIC METABOLIC PANEL
Anion Gap: 4 mmol/L — ABNORMAL LOW (ref 5–15)
BUN/Creatinine Ratio: 7 — ABNORMAL LOW (ref 12–20)
BUN: 11 MG/DL (ref 6–20)
CO2: 28 mmol/L (ref 21–32)
Calcium: 8.1 MG/DL — ABNORMAL LOW (ref 8.5–10.1)
Chloride: 107 mmol/L (ref 97–108)
Creatinine: 1.58 MG/DL — ABNORMAL HIGH (ref 0.55–1.02)
Est, Glom Filt Rate: 42 mL/min/{1.73_m2} — ABNORMAL LOW (ref >60)
Glucose: 71 mg/dL (ref 65–100)
Potassium: 3 mmol/L — ABNORMAL LOW (ref 3.5–5.1)
Sodium: 139 mmol/L (ref 136–145)

## 2023-02-14 LAB — MAGNESIUM: Magnesium: 2 mg/dL (ref 1.6–2.4)

## 2023-02-14 LAB — CREATININE, RANDOM URINE: Creatinine, Ur: 112 mg/dL

## 2023-02-14 MED ORDER — DOXYCYCLINE HYCLATE 100 MG PO TABS
100 MG | ORAL_TABLET | Freq: Two times a day (BID) | ORAL | 0 refills | Status: DC
Start: 2023-02-14 — End: 2023-02-14

## 2023-02-14 MED ORDER — METOPROLOL SUCCINATE ER 50 MG PO TB24
50 | ORAL_TABLET | Freq: Every day | ORAL | 0 refills | 90.00 days | Status: AC
Start: 2023-02-14 — End: 2023-03-23

## 2023-02-14 MED ORDER — POTASSIUM BICARB-CITRIC ACID 20 MEQ PO TBEF
20 | Freq: Once | ORAL | Status: AC
Start: 2023-02-14 — End: 2023-02-14
  Administered 2023-02-14: 12:00:00 40 meq via ORAL

## 2023-02-14 MED ORDER — POLYETHYLENE GLYCOL 3350 17 G PO PACK
17 | PACK | Freq: Every day | ORAL | 0 refills | 27.00 days | Status: AC | PRN
Start: 2023-02-14 — End: 2023-03-16

## 2023-02-14 MED ORDER — DOXYCYCLINE HYCLATE 100 MG PO TABS
100 MG | ORAL_TABLET | Freq: Two times a day (BID) | ORAL | 0 refills | Status: AC
Start: 2023-02-14 — End: 2023-02-28

## 2023-02-14 MED ORDER — LISINOPRIL 20 MG PO TABS
20 | Freq: Every day | ORAL | Status: DC
Start: 2023-02-14 — End: 2023-02-14
  Administered 2023-02-14: 12:00:00 40 mg via ORAL

## 2023-02-14 MED ORDER — BISMUTH SUBSALICYLATE 262 MG/15ML PO SUSP
26215 | Freq: Four times a day (QID) | ORAL | 0 refills | Status: AC
Start: 2023-02-14 — End: 2023-02-28

## 2023-02-14 MED ORDER — AMLODIPINE BESYLATE 10 MG PO TABS
10 MG | ORAL_TABLET | Freq: Every day | ORAL | 3 refills | Status: AC
Start: 2023-02-14 — End: 2023-03-23

## 2023-02-14 MED ORDER — METRONIDAZOLE 500 MG PO TABS
500 MG | ORAL_TABLET | Freq: Three times a day (TID) | ORAL | 0 refills | Status: DC
Start: 2023-02-14 — End: 2023-02-14

## 2023-02-14 MED ORDER — LISINOPRIL 40 MG PO TABS
40 MG | ORAL_TABLET | Freq: Every day | ORAL | 0 refills | Status: AC
Start: 2023-02-14 — End: 2023-03-23

## 2023-02-14 MED ORDER — ONDANSETRON 4 MG PO TBDP
4 MG | ORAL_TABLET | Freq: Three times a day (TID) | ORAL | 0 refills | Status: DC | PRN
Start: 2023-02-14 — End: 2023-02-14

## 2023-02-14 MED ORDER — PANTOPRAZOLE SODIUM 40 MG PO TBEC
40 | ORAL_TABLET | Freq: Two times a day (BID) | ORAL | 0 refills | 90.00 days | Status: AC
Start: 2023-02-14 — End: 2023-02-28

## 2023-02-14 MED ORDER — INSULIN NPH ISOPHANE & REGULAR (70-30) 100 UNIT/ML SC SUSP
Freq: Two times a day (BID) | SUBCUTANEOUS | 1 refills | Status: AC
Start: 2023-02-14 — End: 2023-03-16

## 2023-02-14 MED ORDER — LISINOPRIL 20 MG PO TABS
20 MG | Freq: Every day | ORAL | Status: DC
Start: 2023-02-14 — End: 2023-02-14
  Administered 2023-02-14: 02:00:00 20 mg via ORAL

## 2023-02-14 MED ORDER — PANTOPRAZOLE SODIUM 40 MG PO TBEC
40 | ORAL_TABLET | Freq: Two times a day (BID) | ORAL | 0 refills | 90.00 days | Status: DC
Start: 2023-02-14 — End: 2023-02-14

## 2023-02-14 MED ORDER — TECHNETIUM TC 99M SULFUR COLLOID EGG
Freq: Once | Status: AC | PRN
Start: 2023-02-14 — End: 2023-02-14
  Administered 2023-02-14: 17:00:00 0.3 via INTRAVENOUS

## 2023-02-14 MED ORDER — METRONIDAZOLE 500 MG PO TABS
500 MG | ORAL_TABLET | Freq: Three times a day (TID) | ORAL | 0 refills | Status: AC
Start: 2023-02-14 — End: 2023-02-28

## 2023-02-14 MED ORDER — ONDANSETRON 4 MG PO TBDP
4 MG | ORAL_TABLET | Freq: Three times a day (TID) | ORAL | 0 refills | Status: AC | PRN
Start: 2023-02-14 — End: 2023-03-23

## 2023-02-14 MED ORDER — ALPRAZOLAM 0.25 MG PO TABS
0.25 | Freq: Once | ORAL | Status: AC
Start: 2023-02-14 — End: 2023-02-14
  Administered 2023-02-14: 20:00:00 0.25 mg via ORAL

## 2023-02-14 MED ORDER — BISMUTH SUBSALICYLATE 262 MG/15ML PO SUSP
262 | ORAL | 0.00 refills | Status: DC
Start: 2023-02-14 — End: 2023-02-14

## 2023-02-14 MED FILL — HUMULIN N 100 UNIT/ML SC SUSP: 100 UNIT/ML | SUBCUTANEOUS | Qty: 9

## 2023-02-14 MED FILL — LISINOPRIL 20 MG PO TABS: 20 MG | ORAL | Qty: 2

## 2023-02-14 MED FILL — LISINOPRIL 20 MG PO TABS: 20 MG | ORAL | Qty: 1

## 2023-02-14 MED FILL — AMLODIPINE BESYLATE 5 MG PO TABS: 5 MG | ORAL | Qty: 2

## 2023-02-14 MED FILL — PANTOPRAZOLE SODIUM 40 MG PO TBEC: 40 MG | ORAL | Qty: 1

## 2023-02-14 MED FILL — METOCLOPRAMIDE HCL 10 MG PO TABS: 10 MG | ORAL | Qty: 1

## 2023-02-14 MED FILL — METOPROLOL SUCCINATE ER 50 MG PO TB24: 50 MG | ORAL | Qty: 1

## 2023-02-14 MED FILL — ONDANSETRON HCL 4 MG/2ML IJ SOLN: 4 MG/2ML | INTRAMUSCULAR | Qty: 2

## 2023-02-14 MED FILL — ALPRAZOLAM 0.5 MG PO TABS: 0.5 MG | ORAL | Qty: 1

## 2023-02-14 MED FILL — EFFER-K 20 MEQ PO TBEF: 20 MEQ | ORAL | Qty: 2

## 2023-02-14 MED FILL — HYDROMORPHONE HCL 1 MG/ML IJ SOLN: 1 MG/ML | INTRAMUSCULAR | Qty: 1

## 2023-02-14 MED FILL — ALPRAZOLAM 0.25 MG PO TABS: 0.25 MG | ORAL | Qty: 1

## 2023-02-14 MED FILL — INSULIN LISPRO 100 UNIT/ML IJ SOLN: 100 UNIT/ML | INTRAMUSCULAR | Qty: 1

## 2023-02-14 NOTE — Progress Notes (Signed)
Follow up spiritual care assessment and Advance Medical Directive (AMD) with Ms Anissa Ullmann on B5 multi-specialty oncology floor.   Spanish interpreter [name] had called back Chaplain as Chaplain enter Pt's room where she just finished speaking with Dr, and Pt's daughter is also present. Ms [name] introduced herself and Chaplain. Chaplain and interpreter provided education on NoK and AMD; reviewed section one of AMD thoroughly. Pt did not want to discuss other sections. Ms Niala shared that she is getting married and that she would want him to be a Management consultant but not at this time. She expressed that she wants to leave things as they are with her daughter/children as medical decision makers. Chaplain and interpreter doubled checked with her and confirmed that this is Pt's wishes at this time. Pt expressed her gratitude for everything.   Please contact Spiritual Care for any emotional and spiritual needs and/or referrals. Thank you.    Chaplain Hale Bogus, M.Div., [facility].  [facility] Spiritual Health Team [phone]- PRAY [phone])

## 2023-02-14 NOTE — Progress Notes (Signed)
[facility]  [address] [place], [place] [address]  [phone]        Hospitalist Progress Note      NAME: Melinda [name]   DOB:  July 31, 1981  MRM:  [id number]    Date/Time: 02/14/2023  1:39 PM         Subjective:     Chief Complaint:  "I am ok"     Pt seen and examined. NPO for gastric emptying study. No complaints. Stooling. Apparently still with some mild nausea and pain with clear liquids but no vomiting     ROS:  (bold if positive, if negative)    Ab pain improved   N/V improved  Constipation resolved        Objective:       Vitals:          Last 24hrs VS reviewed since prior progress note. Most recent are:    Vitals:    02/14/23 1157   BP: (!) 152/97   Pulse: 100   Resp: 12   Temp: 98.6 F (37 C)   SpO2: 96%     SpO2 Readings from Last 6 Encounters:   02/14/23 96%   02/04/23 97%   01/20/23 98%   06/29/22 100%   05/15/22 98%   02/16/22 99%        No intake or output data in the 24 hours ending 02/14/23 1339         Exam:     Physical Exam:    Gen: NAD   HEENT:  Pink conjunctivae, PERRL, hearing intact to voice, moist mucous membranes  Neck:  Supple, without masses, thyroid non-tender  Resp:  No accessory muscle use, clear breath sounds without wheezes rales or rhonchi  Card: No murmurs, normal S1, S2 without thrills, bruits or peripheral edema  Abd: epigastric tenderness improved   Musc:  No cyanosis or clubbing  Skin:  No rashes or ulcers, skin turgor is good  Neuro:  Cranial nerves 3-12 are grossly intact, grip strength is 5/5 bilaterally and dorsi / plantarflexion is 5/5 bilaterally, follows commands appropriately  Psych:  Good insight, oriented to person, place and time, alert    Medications Reviewed: (see below)    Lab Data Reviewed: (see below)    ______________________________________________________________________    Medications:     Current Facility-Administered Medications   Medication Dose Route Frequency    lisinopril (PRINIVIL;ZESTRIL) tablet 40 mg  40 mg  Oral Daily    amLODIPine (NORVASC) tablet 10 mg  10 mg Oral Daily    insulin lispro (HUMALOG) injection vial 0-4 Units  0-4 Units SubCUTAneous 4x Daily AC & HS    ALPRAZolam (XANAX) tablet 0.5 mg  0.5 mg Oral Q4H PRN    metoprolol succinate (TOPROL XL) extended release tablet 50 mg  50 mg Oral Daily    insulin NPH (HumuLIN N;NovoLIN N) injection vial 9 Units  0.15 Units/kg SubCUTAneous BID    pantoprazole (PROTONIX) tablet 40 mg  40 mg Oral QAM AC    [Held by provider] metoclopramide (REGLAN) tablet 10 mg  10 mg Oral 4x Daily AC & HS    hydrALAZINE (APRESOLINE) injection 20 mg  20 mg IntraVENous Q6H PRN    HYDROmorphone HCl PF (DILAUDID) injection 1 mg  1 mg IntraVENous Q3H PRN    prochlorperazine (COMPAZINE) injection 10 mg  10 mg IntraVENous Q6H PRN    phenol 1.4 % mouth spray 1 spray  1 spray Mouth/Throat Q2H  PRN    glucose chewable tablet 16 g  4 tablet Oral PRN    dextrose bolus 10% 125 mL  125 mL IntraVENous PRN    Or    dextrose bolus 10% 250 mL  250 mL IntraVENous PRN    dextrose 10 % infusion   IntraVENous Continuous PRN    diazePAM (VALIUM) injection 5 mg  5 mg IntraVENous Q4H PRN    ondansetron (ZOFRAN) injection 4 mg  4 mg IntraVENous Q6H PRN    glucose chewable tablet 16 g  4 tablet Oral PRN    dextrose bolus 10% 125 mL  125 mL IntraVENous PRN    Or    dextrose bolus 10% 250 mL  250 mL IntraVENous PRN    glucagon injection 1 mg  1 mg SubCUTAneous PRN    dextrose 10 % infusion   IntraVENous Continuous PRN    sodium chloride flush 0.9 % injection 5-40 mL  5-40 mL IntraVENous 2 times per day    sodium chloride flush 0.9 % injection 5-40 mL  5-40 mL IntraVENous PRN    0.9 % sodium chloride infusion   IntraVENous PRN    ondansetron (ZOFRAN-ODT) disintegrating tablet 4 mg  4 mg Oral Q8H PRN    Or    ondansetron (ZOFRAN) injection 4 mg  4 mg IntraVENous Q6H PRN    acetaminophen (TYLENOL) tablet 650 mg  650 mg Oral Q6H PRN    Or    acetaminophen (TYLENOL) suppository 650 mg  650 mg Rectal Q6H PRN            Lab  Review:     Recent Labs     02/12/23  0224 02/13/23  0411   WBC 10.4 12.2*   HGB 8.8* 8.8*   HCT 27.6* 28.1*   PLT 466* 426*     Recent Labs     02/12/23  0224 02/13/23  0411 02/14/23  0233   NA 143 142 139   K 3.1* 3.6 3.0*   CL 111* 111* 107   CO2 27 25 28    BUN 20 17 11    MG 2.4 2.1 2.0     No components found for: "GLPOC"         Assessment / Plan:   Abdominal pain - initially thought to be 2/2 post op fluid collection/infection but US guided fluid removal without evidence of infectious fluid. Repeat CT without acute process. Recent EGD without gastritis/PUD   -high suspicion for gastroparesis considering symptomatology and negative scans   -gastric emptying study pending   -s/p NGT tube   -continue reglan for now   -GI eval appreciated    Uncontrolled hypertension - likely 2/2 pain and nausea   -continue metoprolol at uptitrated dose   -on amlodipine   -increase lisinopril  -PRN hydralazine     Hypokalemia  -replete      DM - A1c 8.5 and historically POORLY CONTROLLED  -on ISS   -continue NPH     AKI - likely 2/2 IVVD and suspect CKD considering overt proteinuria/diabetic/hypertensive nephropathy  -improving even with reinitiation and uptitration of lisinopri    H.pylori gastritis - on recent EGD biopsy   -will need quadruple therapy with bismuth, flagyl, doxy, PPI x 14 days at discharge     Total time spent with patient: 29 Minutes                  Care Plan discussed with: Patient, Family, Nursing Staff, Consultant/Specialist, >50% of  time spent in counseling and coordination of care, and      Discussed:  Care Plan    Prophylaxis:  SCD's    Disposition:  Home w/Family           ___________________________________________________    Attending Physician: Pieter Partridge, MD

## 2023-02-14 NOTE — Discharge Instructions (Addendum)
HOSPITALIST DISCHARGE INSTRUCTIONS  NAME:  Melinda [name]   DOB:  [birth year]   MRN:  [id number]     Date/Time:  02/14/2023 3:30 PM    ADMIT DATE: 02/10/2023     DISCHARGE DATE: 02/14/2023     DISCHARGE DIAGNOSIS:  Gastroparesis   H.pylori    DISCHARGE INSTRUCTIONS:  Thank you for allowing [place] to participate in your care. Your discharging Hospitalist is Pieter Partridge, MD. [name] were admitted for evaluation and treatment of the above.       MEDICATIONS:  1) PLEASE CONTINUE WITH A CLEAR LIQUID OR FULL LIQUID DIET AND ADVANCE AS TOLERATED     It is important that you take the medication exactly as they are prescribed.   Keep your medication in the bottles provided by the pharmacist and keep a list of the medication names, dosages, and times to be taken in your wallet.   Do not take other medications without consulting your doctor.             If you experience any of the following symptoms then please call your primary care physician or return to the emergency room if you cannot get hold of your doctor:  Fever, chills, nausea, vomiting, diarrhea, change in mentation, falling, bleeding, shortness of breath    Follow Up:  Please call the below provider to arrange hospital follow up appointment      No follow-up provider specified.    For questions regarding your Hospitalization or to contact the Hospital Medicine team, please call [phone].      Information obtained by :  I understand that if any problems occur once I am at home I am to contact my physician.    I understand and acknowledge receipt of the instructions indicated above.                                                                                                                                           Physician's or R.N.'s Signature                                                                  Date/Time  Patient or Risk manager

## 2023-02-14 NOTE — Discharge Summary (Signed)
Physician Discharge Summary     Patient ID:  Melinda [name]  [id number]  42 y.o.  1981-01-13    Admit date: 02/10/2023    Discharge date and time: 02/14/2023    Admission Diagnoses: Hypokalemia [E87.6]  Abdominal pain [R10.9]  Hyperglycemia [R73.9]  S/P cholecystectomy [Z90.49]  Post-operative state [Z98.890]  Intractable epigastric abdominal pain [R10.13]  Nausea and vomiting, unspecified vomiting type [R11.2]    Discharge Diagnoses/Hospital Course   Melinda [name] is a 42 yo female with PMH of poorly controlled HTN, DM and recent lap chole admitted with epigastric ab pain with intractable N/V. Initial CT showed a post op fluid collection. She is s/p US guided fluid collection which did not yield infectious growth. Pt briefly had an NG tube and was started on reglan 2/2 suspicion for gastroparesis considering recent EGD that did not show PUD and CT x 2 without acute process. She had a gastric emptying study that was NEGATIVE but this was completed after pt was on IV reglan x 2 days so likely a FALSE NEG.      Uncontrolled hypertension - ?2/2 compliance. On metoprolol, amlodipine, lisinopril      Hypokalemia - replete      DM - A1c 8.5 and historically more POORLY CONTROLLED. ?compliance.      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 ACE I resumed and Cr stable and improving.     H.pylori gastritis - on recent EGD biopsy. Will need quadruple therapy with bismuth, flagyl, doxy, PPI x 14 days at discharge     PCP: Idowu, Funmilola, APRN - NP     Consults: GI and general surgery    Discharge Exam:  Vitals:     02/14/23 1036 02/14/23 1157 02/14/23 1536   BP:  (!) 145/89 (!) 152/97 (!) 174/102   Pulse:  93 100 (!) 102   Resp:   12    Temp:   98.6 F (37 C) 98.2 F (36.8 C)   TempSrc:   Oral Oral   SpO2:   96% 98%   Weight:       Height:          Gen: NAD   HEENT:  Pink conjunctivae, PERRL, hearing intact to voice, moist mucous membranes  Neck:  Supple,  without masses, thyroid non-tender  Resp:  No accessory muscle use, clear breath sounds without wheezes rales or rhonchi  Card: No murmurs, normal S1, S2 without thrills, bruits or peripheral edema  Abd: epigastric tenderness improving  Musc:  No cyanosis or clubbing  Skin:  No rashes or ulcers, skin turgor is good  Neuro:  Cranial nerves 3-12 are grossly intact, grip strength is 5/5 bilaterally and dorsi / plantarflexion is 5/5 bilaterally, follows commands appropriately  Psych:  Good insight, oriented to person, place and time, alert     Disposition: home    Patient Instructions:   Discharge Medication List as of 02/14/2023  4:51 PM        CONTINUE these medications which have CHANGED    Details   insulin 70-30 (NOVOLIN 70/30) (70-30) 100 UNIT per ML injection vial Inject 15 Units into the skin 2 times daily, Disp-9 mL, R-1Print      bismuth subsalicylate (PEPTO BISMOL) 262 MG/15ML suspension Take 15 mLs by mouth 4 times daily for 14 days, Disp-840 mL, R-0Print      ondansetron (ZOFRAN-ODT) 4 MG disintegrating tablet Take 1 tablet by mouth every 8 hours as needed for  Nausea or Vomiting, Disp-30 tablet, R-0Print      lisinopril (PRINIVIL;ZESTRIL) 40 MG tablet Take 1 tablet by mouth daily, Disp-30 tablet, R-0Print      metoprolol succinate (TOPROL XL) 50 MG extended release tablet Take 1 tablet by mouth daily, Disp-30 tablet, R-0Print      amLODIPine (NORVASC) 10 MG tablet Take 1 tablet by mouth daily, Disp-30 tablet, R-3Print      polyethylene glycol (GLYCOLAX) 17 g packet Take 1 packet by mouth daily as needed for Constipation, Disp-30 packet, R-0Print      metroNIDAZOLE (FLAGYL) 500 MG tablet Take 1 tablet by mouth 3 times daily for 14 days, Disp-42 tablet, R-0Print      doxycycline hyclate (VIBRA-TABS) 100 MG tablet Take 1 tablet by mouth 2 times daily for 14 days, Disp-28 tablet, R-0Print      pantoprazole (PROTONIX) 40 MG tablet Take 1 tablet by mouth in the morning and at bedtime for 14 days, Disp-28 tablet,  R-0Print           CONTINUE these medications which have NOT CHANGED    Details   naloxone (NARCAN) 4 MG/0.1ML LIQD nasal spray 1 spray by Nasal route as needed for Opioid Reversal, Disp-2 each, R-0Normal           STOP taking these medications       ferrous sulfate (IRON 325) 325 (65 Fe) MG tablet Comments:   Reason for Stopping:             Activity: activity as tolerated  Diet:  frequent small meals; diabetic   Wound Care: none needed    Follow-up with Idowu, Funmilola, APRN - NP     Approximate time spent in patient care on day of discharge: 35 minutes     Signed:  Pieter Partridge, MD  02/21/2023  11:17 AM

## 2023-02-14 NOTE — Plan of Care (Signed)
Problem: Discharge Planning  Goal: Discharge to home or other facility with appropriate resources  Outcome: Progressing     Problem: Pain  Goal: Verbalizes/displays adequate comfort level or baseline comfort level  Outcome: Progressing     Problem: Safety - Adult  Goal: Free from fall injury  Outcome: Progressing     Problem: Skin/Tissue Integrity  Goal: Absence of new skin breakdown  Description: 1.  Monitor for areas of redness and/or skin breakdown  2.  Assess vascular access sites hourly  3.  Every 4-6 hours minimum:  Change oxygen saturation probe site  4.  Every 4-6 hours:  If on nasal continuous positive airway pressure, respiratory therapy assess nares and determine need for appliance change or resting period.  Outcome: Progressing     Problem: Chronic Conditions and Co-morbidities  Goal: Patient's chronic conditions and co-morbidity symptoms are monitored and maintained or improved  Outcome: Progressing

## 2023-02-14 NOTE — Care Coordination-Inpatient (Signed)
02/14/23  3:54 PM    CM noted patient goes to the [facility] for primary care, they also assist with providing medications. CM met with patient and her daughter in the room to verify. Confirmed they assist with medications but patient will need scripts. Messaged attending.     Devona Konig, MSW  [facility]  Case Manager

## 2023-02-14 NOTE — Progress Notes (Signed)
Follow up spiritual care and Advance Medical Directive attempted for patient, Ms. Melinda [name] on [place].  Reviewed chart noted prior to attempt. Pt is Spanish speaking only. VM's were left with Spanish interpreter and Interpreter [name] called Chaplains office for updates. Chaplain spoke with her and then spoke with attending RN who shared that Pt was out for testing this morning and will be back in her room soon. Pt arrived but is tired and sleeping. Pt's daughter, Melinda [name] is in the room and said that she is sleeping and it would be better to try again later. Chaplain called and spoke with Spanish interpreter [name], relayed info and both will attempt around 11:30 or 12 noon. Please contact Spiritual Care for any emotional and spiritual needs and/or referrals. Thank you.    Chaplain Hale Bogus, M.Div., [facility].  [facility] Spiritual Health Team [phone] PRAY [phone])

## 2023-02-14 NOTE — Plan of Care (Signed)
Problem: Discharge Planning  Goal: Discharge to home or other facility with appropriate resources  02/14/2023 1648 by Remonia Richter, RN  Outcome: Adequate for Discharge  02/14/2023 1648 by Remonia Richter, RN  Outcome: Adequate for Discharge  02/14/2023 0836 by Remonia Richter, RN  Outcome: Progressing     Problem: Pain  Goal: Verbalizes/displays adequate comfort level or baseline comfort level  02/14/2023 1648 by Remonia Richter, RN  Outcome: Adequate for Discharge  02/14/2023 1648 by Remonia Richter, RN  Outcome: Adequate for Discharge  02/14/2023 0836 by Remonia Richter, RN  Outcome: Progressing     Problem: ABCDS Injury Assessment  Goal: Absence of physical injury  02/14/2023 1648 by Remonia Richter, RN  Outcome: Adequate for Discharge  02/14/2023 1648 by Remonia Richter, RN  Outcome: Adequate for Discharge  02/14/2023 0836 by Remonia Richter, RN  Outcome: Progressing     Problem: Chronic Conditions and Co-morbidities  Goal: Patient's chronic conditions and co-morbidity symptoms are monitored and maintained or improved  02/14/2023 1648 by Remonia Richter, RN  Outcome: Adequate for Discharge  02/14/2023 1648 by Remonia Richter, RN  Outcome: Adequate for Discharge  02/14/2023 0836 by Remonia Richter, RN  Outcome: Progressing     Problem: Safety - Adult  Goal: Free from fall injury  02/14/2023 1648 by Remonia Richter, RN  Outcome: Adequate for Discharge  02/14/2023 1648 by Remonia Richter, RN  Outcome: Adequate for Discharge  02/14/2023 0836 by Remonia Richter, RN  Outcome: Progressing     Problem: Skin/Tissue Integrity  Goal: Absence of new skin breakdown  Description: 1.  Monitor for areas of redness and/or skin breakdown  2.  Assess vascular access sites hourly  3.  Every 4-6 hours minimum:  Change oxygen saturation probe site  4.  Every 4-6 hours:  If on nasal continuous positive airway pressure, respiratory therapy assess nares and determine need for appliance change or resting period.  02/14/2023 1648 by Remonia Richter,  RN  Outcome: Adequate for Discharge  02/14/2023 1648 by Remonia Richter, RN  Outcome: Adequate for Discharge  02/14/2023 [phone] by Remonia Richter, RN  Outcome: Progressing

## 2023-02-14 NOTE — ACP (Advance Care Planning) (Signed)
Advance Care Planning     Advance Care Planning Inpatient Note  Spiritual Care Department    Today's Date: 02/14/2023  Unit: [facility] B3 INTERMEDIATE CARE UNIT    Received request from admission screening.  Upon review of chart and communication with care team, patient's decision making abilities are not in question.. Patient and Child/Children was/were present in the room during visit.    Goals of ACP Conversation:  Facilitate a discussion related to patient's goals of care as they align with the patient's values and beliefs.    Health Care Decision Makers:     No healthcare decision makers have been documented.  Click here to complete Clinical research associate of the Environmental health practitioner Relationship (ie "Primary")  Summary:  Documented Next of Kin, per patient report    Advance Care Planning Documents (Patient Wishes):  None     Assessment:  Follow up spiritual care assessment and Advance Medical Directive (AMD) with [name] on B5 multi-specialty oncology floor.   Spanish interpreter Melinda [name] had called back Chaplain as Chaplain enter Pt's room where she just finished speaking with Dr, and Pt's daughter is also present. [name] introduced herself and Chaplain. Chaplain and interpreter provided education on NoK and AMD; reviewed section one of AMD thoroughly. Pt did not want to discuss other sections. [name] shared that she is getting married and that she would want him to be a Management consultant but not at this time. She expressed that she wants to leave things as they are with her daughter/children as medical decision makers. Chaplain and interpreter doubled checked with her and confirmed that this is Pt's wishes at this time. Pt expressed her gratitude for everything.   Please contact Spiritual Care for any emotional and spiritual needs and/or referrals. Thank you.      Interventions:  Provided education on documents for clarity and greater understanding  Discussed and provided  education on state decision maker hierarchy  Encouraged ongoing ACP conversation with future decision makers and loved ones  Reviewed but did not complete ACP document  Patient DECLINED ACP conversation    Care Preferences Communicated:   No    Outcomes/Plan:  ACP Discussion: Completed  Teach Back Method used to verify the patient's and/or Healthcare Decision Maker's understanding of key information in the advance directive documents    Electronically signed by Hale Bogus, [facility] on 02/14/2023 at 12:28 PM

## 2023-02-15 LAB — CULTURE, BODY FLUID: Gram Stain: NONE SEEN

## 2023-02-17 ENCOUNTER — Emergency Department: Admit: 2023-02-17 | Payer: MEDICAID | Primary: Adult Health

## 2023-02-17 ENCOUNTER — Inpatient Hospital Stay
Admit: 2023-02-17 | Discharge: 2023-02-17 | Disposition: A | Discharge status: Home / Self Care | Payer: MEDICAID | Attending: Emergency Medicine

## 2023-02-17 DIAGNOSIS — K279 Peptic ulcer, site unspecified, unspecified as acute or chronic, without hemorrhage or perforation: Secondary | ICD-10-CM

## 2023-02-17 DIAGNOSIS — R1013 Epigastric pain: Secondary | ICD-10-CM

## 2023-02-17 LAB — COMPREHENSIVE METABOLIC PANEL
ALT: 29 U/L (ref 12–78)
AST: 32 U/L (ref 15–37)
Albumin/Globulin Ratio: 0.6 — ABNORMAL LOW (ref 1.1–2.2)
Albumin: 2.8 g/dL — ABNORMAL LOW (ref 3.5–5.0)
Alk Phosphatase: 116 U/L (ref 45–117)
Anion Gap: 5 mmol/L (ref 5–15)
BUN/Creatinine Ratio: 5 — ABNORMAL LOW (ref 12–20)
BUN: 8 MG/DL (ref 6–20)
CO2: 22 mmol/L (ref 21–32)
Calcium: 8.7 MG/DL (ref 8.5–10.1)
Chloride: 112 mmol/L — ABNORMAL HIGH (ref 97–108)
Creatinine: 1.6 MG/DL — ABNORMAL HIGH (ref 0.55–1.02)
Est, Glom Filt Rate: 42 mL/min/{1.73_m2} — ABNORMAL LOW (ref >60)
Globulin: 4.4 g/dL — ABNORMAL HIGH (ref 2.0–4.0)
Glucose: 170 mg/dL — ABNORMAL HIGH (ref 65–100)
Potassium: 3.9 mmol/L (ref 3.5–5.1)
Sodium: 139 mmol/L (ref 136–145)
Total Bilirubin: 0.3 MG/DL (ref 0.2–1.0)
Total Protein: 7.2 g/dL (ref 6.4–8.2)

## 2023-02-17 LAB — CBC WITH AUTO DIFFERENTIAL
Basophils %: 1 % (ref 0–1)
Basophils Absolute: 0.1 10*3/uL (ref 0.0–0.1)
Eosinophils %: 4 % (ref 0–7)
Eosinophils Absolute: 0.4 10*3/uL (ref 0.0–0.4)
Hematocrit: 36.5 % (ref 35.0–47.0)
Hemoglobin: 11.9 g/dL (ref 11.5–16.0)
Immature Granulocytes %: 0 % (ref 0.0–0.5)
Immature Granulocytes Absolute: 0 10*3/uL (ref 0.00–0.04)
Lymphocytes %: 18 % (ref 12–49)
Lymphocytes Absolute: 1.8 10*3/uL (ref 0.8–3.5)
MCH: 26.2 PG (ref 26.0–34.0)
MCHC: 32.6 g/dL (ref 30.0–36.5)
MCV: 80.2 FL (ref 80.0–99.0)
MPV: 8.9 FL (ref 8.9–12.9)
Monocytes %: 5 % (ref 5–13)
Monocytes Absolute: 0.5 10*3/uL (ref 0.0–1.0)
Neutrophils %: 72 % (ref 32–75)
Neutrophils Absolute: 7.4 10*3/uL (ref 1.8–8.0)
Nucleated RBCs: 0 PER 100 WBC
Platelets: 484 10*3/uL — ABNORMAL HIGH (ref 150–400)
RBC: 4.55 M/uL (ref 3.80–5.20)
RDW: 20.6 % — ABNORMAL HIGH (ref 11.5–14.5)
WBC: 10.2 10*3/uL (ref 3.6–11.0)
nRBC: 0 10*3/uL (ref 0.00–0.01)

## 2023-02-17 LAB — MAGNESIUM: Magnesium: 2.1 mg/dL (ref 1.6–2.4)

## 2023-02-17 LAB — EXTRA TUBES HOLD

## 2023-02-17 LAB — LIPASE: Lipase: 42 U/L (ref 13–75)

## 2023-02-17 MED ORDER — DROPERIDOL 2.5 MG/ML IJ SOLN
2.5 | Freq: Once | INTRAMUSCULAR | Status: AC
Start: 2023-02-17 — End: 2023-02-17
  Administered 2023-02-17: 19:00:00 0.625 mg via INTRAVENOUS

## 2023-02-17 MED ORDER — HYDROMORPHONE HCL PF 1 MG/ML IJ SOLN
1 | INTRAMUSCULAR | Status: AC
Start: 2023-02-17 — End: 2023-02-17
  Administered 2023-02-17: 16:00:00 1 mg via INTRAVENOUS

## 2023-02-17 MED ORDER — ONDANSETRON HCL 4 MG/2ML IJ SOLN
4 | Freq: Once | INTRAMUSCULAR | Status: AC
Start: 2023-02-17 — End: 2023-02-17
  Administered 2023-02-17: 15:00:00 4 mg via INTRAVENOUS

## 2023-02-17 MED ORDER — IOPAMIDOL 76 % IV SOLN
76 | Freq: Once | INTRAVENOUS | Status: AC | PRN
Start: 2023-02-17 — End: 2023-02-17
  Administered 2023-02-17: 16:00:00 100 mL via INTRAVENOUS

## 2023-02-17 MED ORDER — METOCLOPRAMIDE HCL 10 MG PO TABS
10 | ORAL | 0 refills | 28.00 days | Status: DC | PRN
Start: 2023-02-17 — End: 2023-03-23

## 2023-02-17 MED ORDER — SODIUM CHLORIDE 0.9 % IV BOLUS
0.9 | Freq: Once | INTRAVENOUS | Status: AC
Start: 2023-02-17 — End: 2023-02-17
  Administered 2023-02-17: 15:00:00 1000 mL via INTRAVENOUS

## 2023-02-17 MED ORDER — MORPHINE SULFATE (PF) 4 MG/ML IJ SOLN
4 | INTRAMUSCULAR | Status: AC
Start: 2023-02-17 — End: 2023-02-17
  Administered 2023-02-17: 15:00:00 4 mg via INTRAVENOUS

## 2023-02-17 MED ORDER — OXYCODONE HCL 5 MG PO TABS
5 | ORAL_TABLET | Freq: Four times a day (QID) | ORAL | 0 refills | Status: AC | PRN
Start: 2023-02-17 — End: 2023-02-20

## 2023-02-17 MED FILL — MORPHINE SULFATE 4 MG/ML IJ SOLN: 4 mg/mL | INTRAMUSCULAR | Qty: 1

## 2023-02-17 MED FILL — ISOVUE-370 76 % IV SOLN: 76 % | INTRAVENOUS | Qty: 100

## 2023-02-17 MED FILL — HYDROMORPHONE HCL 1 MG/ML IJ SOLN: 1 MG/ML | INTRAMUSCULAR | Qty: 1

## 2023-02-17 MED FILL — SODIUM CHLORIDE 0.9 % IV SOLN: 0.9 % | INTRAVENOUS | Qty: 1000

## 2023-02-17 MED FILL — DROPERIDOL 2.5 MG/ML IJ SOLN: 2.5 MG/ML | INTRAMUSCULAR | Qty: 2

## 2023-02-17 MED FILL — ONDANSETRON HCL 4 MG/2ML IJ SOLN: 4 MG/2ML | INTRAMUSCULAR | Qty: 2

## 2023-02-17 NOTE — Consults (Signed)
Session ID: [id number]  Language: Spanish  Interpreter ID: #[id number]  Interpreter Name: Leone Payor

## 2023-02-17 NOTE — ED Triage Notes (Signed)
Translator in uses for triage.     Patient to ER for complaints of abdominal pain with N/V since 05/08.     Last BM last night.     Patient recently seen in the ER for same symptoms.     Dr. Sandi Mariscal in triage to assess patient.

## 2023-02-17 NOTE — Consults (Signed)
Session ID: [id number]  Language: Spanish  Interpreter ID: #[id number]  Interpreter Name: Phil Dopp

## 2023-02-17 NOTE — Consults (Signed)
Session ID: [id number]  Language: Spanish  Interpreter ID: #[id number]  Interpreter Name: Regan Lemming

## 2023-02-17 NOTE — ED Notes (Signed)
Interaction done with interpreter ID [phone].    Patient attached to the monitor, pain medications given. Call bell placed within reach.

## 2023-02-17 NOTE — ED Provider Notes (Signed)
[facility] EMERGENCY DEPT  EMERGENCY DEPARTMENT ENCOUNTER      Pt Name: Melinda [name]  MRN: [id number]  Birthdate October 23, 1980  Date of evaluation: 02/17/2023  Provider: Ellsworth Lennox, DO    Patient is Spanish speaking only, interpreter services used ID [phone]    CHIEF COMPLAINT       Chief Complaint   Patient presents with    Abdominal Pain    Emesis         HISTORY OF PRESENT ILLNESS    HPI    Melinda [name] is a 42 y.o. female with a history of HTN, HLD, DM who presents to the emergency department for evaluation of abdominal pain. Patient notes epigastric abdominal pain ongoing for several weeks, admitted recently for the same. States upon discharge was feeling better but pain returned yesterday. It is located in the epigastric region and described as severe.  She endorses associated nausea and vomiting.  States she has been taking medication she was discharged with as prescribed.  No fevers.  No diarrhea.    Nursing Notes were reviewed.    REVIEW OF SYSTEMS       Review of Systems   Constitutional:  Negative for chills and fever.   HENT:  Negative for congestion and rhinorrhea.    Eyes:  Negative for discharge and redness.   Respiratory:  Negative for cough and shortness of breath.    Cardiovascular:  Negative for chest pain.   Gastrointestinal:  Positive for abdominal pain, nausea and vomiting. Negative for diarrhea.   Neurological:  Negative for speech difficulty.   Psychiatric/Behavioral:  Negative for agitation.            PAST MEDICAL HISTORY     Past Medical History:   Diagnosis Date    High blood pressure     High cholesterol     Type 2 diabetes mellitus (HCC)     Controled         SURGICAL HISTORY       Past Surgical History:   Procedure Laterality Date    CAPSULE ENDOSCOPY N/A 01/20/2023    ESOPHAGEAL CAPSULE ENDOSCOPY remove at 1624PM performed by Glyn Ade, MD at [facility] ENDOSCOPY    CHOLECYSTECTOMY, LAPAROSCOPIC N/A 02/03/2023    ROBOTIC LAPAROSCOPIC CHOLECYSTECTOMY with Indocyanine  green performed by Verdis Frederickson, MD at [facility] MAIN OR    COLONOSCOPY N/A 01/19/2023    COLONOSCOPY DIAGNOSTIC performed by Glyn Ade, MD at [facility] ENDOSCOPY    OTHER SURGICAL HISTORY Left     Rentia attachment    TUBAL LIGATION Bilateral     UPPER GASTROINTESTINAL ENDOSCOPY N/A 01/17/2023    ESOPHAGOGASTRODUODENOSCOPY performed by Glyn Ade, MD at [facility] ENDOSCOPY    UPPER GASTROINTESTINAL ENDOSCOPY N/A 01/17/2023    ESOPHAGOGASTRODUODENOSCOPY BIOPSY performed by Glyn Ade, MD at [facility] ENDOSCOPY    UPPER GASTROINTESTINAL ENDOSCOPY N/A 01/18/2023    ESOPHAGOGASTRODUODENOSCOPY performed by Glyn Ade, MD at [facility] ENDOSCOPY         CURRENT MEDICATIONS       Discharge Medication List as of 02/17/2023  1:57 PM        CONTINUE these medications which have NOT CHANGED    Details   insulin 70-30 (NOVOLIN 70/30) (70-30) 100 UNIT per ML injection vial Inject 15 Units into the skin 2 times daily, Disp-9 mL, R-1Print      bismuth subsalicylate (PEPTO BISMOL) 262 MG/15ML suspension Take 15 mLs by mouth 4 times daily for 14 days, Disp-840 mL, R-0Print  ondansetron (ZOFRAN-ODT) 4 MG disintegrating tablet Take 1 tablet by mouth every 8 hours as needed for Nausea or Vomiting, Disp-30 tablet, R-0Print      lisinopril (PRINIVIL;ZESTRIL) 40 MG tablet Take 1 tablet by mouth daily, Disp-30 tablet, R-0Print      metoprolol succinate (TOPROL XL) 50 MG extended release tablet Take 1 tablet by mouth daily, Disp-30 tablet, R-0Print      amLODIPine (NORVASC) 10 MG tablet Take 1 tablet by mouth daily, Disp-30 tablet, R-3Print      polyethylene glycol (GLYCOLAX) 17 g packet Take 1 packet by mouth daily as needed for Constipation, Disp-30 packet, R-0Print      metroNIDAZOLE (FLAGYL) 500 MG tablet Take 1 tablet by mouth 3 times daily for 14 days, Disp-42 tablet, R-0Print      doxycycline hyclate (VIBRA-TABS) 100 MG tablet Take 1 tablet by mouth 2 times daily for 14 days, Disp-28 tablet, R-0Print      pantoprazole (PROTONIX) 40 MG tablet  Take 1 tablet by mouth in the morning and at bedtime for 14 days, Disp-28 tablet, R-0Print      naloxone (NARCAN) 4 MG/0.1ML LIQD nasal spray 1 spray by Nasal route as needed for Opioid Reversal, Disp-2 each, R-0Normal             ALLERGIES     Patient has no known allergies.    FAMILY HISTORY     No family history on file.       SOCIAL HISTORY       Social History     Socioeconomic History    Marital status: Married   Tobacco Use    Smoking status: Never    Smokeless tobacco: Never   Vaping Use    Vaping Use: Never used   Substance and Sexual Activity    Alcohol use: Never    Drug use: Never     Social Determinants of Health     Food Insecurity: Food Insecurity Present (02/10/2023)    Hunger Vital Sign     Worried About Running Out of Food in the Last Year: Sometimes true     Ran Out of Food in the Last Year: Sometimes true   Transportation Needs: No Transportation Needs (02/10/2023)    PRAPARE - Therapist, art (Medical): No     Lack of Transportation (Non-Medical): No   Housing Stability: Low Risk  (02/10/2023)    Housing Stability Vital Sign     Unable to Pay for Housing in the Last Year: No     Number of Places Lived in the Last Year: 1     Unstable Housing in the Last Year: No           PHYSICAL EXAM       ED Triage Vitals   BP Temp Temp src Pulse Resp SpO2 Height Weight   -- -- -- -- -- -- -- --       Body mass index is 27.27 kg/m.    Physical Exam  Vitals and nursing note reviewed.   Constitutional:       General: She is in acute distress.      Appearance: Normal appearance. She is not ill-appearing or toxic-appearing.      Comments: Actively vomiting    HENT:      Head: Normocephalic and atraumatic.   Eyes:      General: No scleral icterus.        Right eye: No discharge.  Left eye: No discharge.      Conjunctiva/sclera: Conjunctivae normal.   Cardiovascular:      Rate and Rhythm: Normal rate.      Pulses: Normal pulses.   Pulmonary:      Effort: Pulmonary effort is normal. No  respiratory distress.   Abdominal:      Tenderness: There is abdominal tenderness in the epigastric area. There is no guarding or rebound.   Musculoskeletal:         General: Normal range of motion.      Cervical back: Normal range of motion.   Skin:     General: Skin is warm and dry.      Capillary Refill: Capillary refill takes less than 2 seconds.   Neurological:      General: No focal deficit present.      Mental Status: She is alert.   Psychiatric:         Mood and Affect: Mood normal.         Behavior: Behavior normal.         DIAGNOSTIC RESULTS     EKG: All EKG's are interpreted by the Emergency Department Physician who either signs or Co-signs this chart in the absence of a cardiologist.    ED Course as of 02/18/23 1114   Thu Feb 17, 2023   1037 EKG 12 Lead  ECG at 10:35 AM, inter by me: Sinus tachycardia, rate 106 bpm.  Left axis deviation.  Normal intervals.  No ST elevations. [SH]      ED Course User Index  [SH] Ellsworth Lennox, DO       RADIOLOGY:   Non-plain film images such as CT, Ultrasound and MRI are read by the radiologist. Plain radiographic images are visualized and preliminarily interpreted by the emergency physician with the below findings:        Interpretation per the Radiologist below, if available at the time of this note:    CT ABDOMEN PELVIS W IV CONTRAST Additional Contrast? None   Final Result   No Acute Disease.              LABS:  Labs Reviewed   CBC WITH AUTO DIFFERENTIAL - Abnormal; Notable for the following components:       Result Value    RDW 20.6 (*)     Platelets 484 (*)     All other components within normal limits   COMPREHENSIVE METABOLIC PANEL - Abnormal; Notable for the following components:    Chloride 112 (*)     Glucose 170 (*)     Creatinine 1.60 (*)     Bun/Cre Ratio 5 (*)     Est, Glom Filt Rate 42 (*)     Albumin 2.8 (*)     Globulin 4.4 (*)     Albumin/Globulin Ratio 0.6 (*)     All other components within normal limits   EXTRA TUBES HOLD   LIPASE   MAGNESIUM    URINALYSIS WITH REFLEX TO CULTURE       All other labs were within normal range or not returned as of this dictation.    EMERGENCY DEPARTMENT COURSE and DIFFERENTIAL DIAGNOSIS/MDM:   Vitals:    Vitals:    02/17/23 1400 02/17/23 1430 02/17/23 1500 02/17/23 1530   BP: (!) 218/117 (!) 215/110 (!) 182/109 (!) 200/110   Pulse: 92 92 95 99   Resp: 11 14 10 11    Temp:       TempSrc:  SpO2: 99% 96% 95% 95%   Weight:       Height:               Medical Decision Making      DECISION MAKING:  Meldoy Guffey is a 42 y.o. female who comes in as above.  Vital signs reviewed, patient is afebrile.  Blood pressure elevated at 170/101 and heart rate 109 bpm, she has not taken her blood pressure medications due to vomiting.  She does have epigastric tenderness but no guarding or rebound.  On review of records, patient has had several previous hospitalizations over the last month and a half for similar episodes.  Patient had cholecystectomy on 4/25, readmitted on 5/2 for intractable pain with nausea and vomiting.  Imaging on 5/2 showed fluid collection in the gallbladder fossa concerning for abscess, underwent IR drainage and fluid was without any evidence of infection.  Repeat scan during that hospitalization showed resolution of the fluid collection.  Suspected that patient had gastroparesis and discharged home once feeling improved.  She is also on treatment for H. pylori.  Given her history will reevaluate CT imaging of the abdomen pelvis today to evaluate for bowel obstruction, infection, perforation.  I did consider AAA or dissection given her hypertension but she has equal pulses throughout, pain does not radiate into the back and has had similar symptoms over the last month.    Labs reviewed.  CBC without leukocytosis or anemia.  There is thrombocytosis, platelet count 484 K.  Electrolytes are stable.  Glucose 170.  Creatinine 1.60, BUN normal.  Patient was hydrated with IV fluids.  LFTs, lipase are unremarkable.   CT of the abdomen pelvis is without any acute intra-abdominal process.    Amount and/or Complexity of Data Reviewed  Labs: ordered. Decision-making details documented in ED Course.  Radiology: ordered. Decision-making details documented in ED Course.  ECG/medicine tests: ordered and independent interpretation performed. Decision-making details documented in ED Course.    Risk  Prescription drug management.        CONSULTS:  None    REASSESSMENT     On initial reevaluation, patient was sleeping comfortably.  Upon waking, patient stating that pain had returned and actively began vomiting.  Will trial a dose of droperidol and if patient is subsequently able to tolerate oral intake will discharge home.    Patient subsequently without any further vomiting and improvement of symptoms.  All available radiology and laboratory results have been reviewed with patient and/or available family.  Patient instructed to take her blood pressure medications when she gets home.  Encouraged on follow-up with PCP and given ER return precautions.  Patient and/or family verbally conveyed their understanding and agreement of the patient's signs, symptoms, diagnosis, treatment and prognosis and additionally agree to follow-up as recommended in the discharge instructions or to return to the Emergency Department should their condition change or worsen prior to their follow-up appointment.  All questions have been answered and patient and/or available family express understanding.        PROCEDURES:  Unless otherwise noted below, none     Procedures      FINAL IMPRESSION      1. Epigastric pain    2. H pylori ulcer    3. Cyclical vomiting          DISPOSITION/PLAN   DISPOSITION Decision To Discharge 02/17/2023 03:36:43 PM      PATIENT REFERRED TO:  Orlean Bradford, APRN - NP  [id number]  [place] [address]  [phone]    Schedule an appointment as soon as possible for a visit       GI    Schedule an appointment as soon as possible  for a visit       [facility] EMERGENCY DEPT  [address] [place]  [place] [address]  [phone]    If symptoms worsen      DISCHARGE MEDICATIONS:  Discharge Medication List as of 02/17/2023  1:57 PM        START taking these medications    Details   oxyCODONE (ROXICODONE) 5 MG immediate release tablet Take 1 tablet by mouth every 6 hours as needed for Pain for up to 3 days. Intended supply: 3 days. Take lowest dose possible to manage pain Max Daily Amount: 20 mg, Disp-12 tablet, R-0Normal      metoclopramide (REGLAN) 10 MG tablet Take 1 tablet by mouth 4 times daily as needed (nausea or vomiting), Disp-120 tablet, R-0Normal               (Please note that portions of this note were completed with a voice recognition program.  Efforts were made to edit the dictations but occasionally words are mis-transcribed.)    Ellsworth Lennox, DO (electronically signed)  Emergency Attending Physician / Physician Assistant / Nurse Practitioner         Ellsworth Lennox, DO  02/18/23 1114

## 2023-02-18 LAB — EKG 12-LEAD
Atrial Rate: 106 {beats}/min
P-R Interval: 130 ms
Q-T Interval: 354 ms
QRS Duration: 78 ms
QTc Calculation (Bazett): 470 ms
R Axis: -31 degrees
T Axis: 145 degrees
Ventricular Rate: 106 {beats}/min

## 2023-03-16 ENCOUNTER — Emergency Department: Admit: 2023-03-16 | Payer: MEDICAID | Primary: Adult Health

## 2023-03-16 ENCOUNTER — Inpatient Hospital Stay
Admission: EM | Admit: 2023-03-16 | Discharge: 2023-03-23 | Disposition: A | Discharge status: Home / Self Care | Payer: MEDICAID | Admitting: Internal Medicine

## 2023-03-16 DIAGNOSIS — R1013 Epigastric pain: Secondary | ICD-10-CM

## 2023-03-16 DIAGNOSIS — K3184 Gastroparesis: Secondary | ICD-10-CM

## 2023-03-16 LAB — COMPREHENSIVE METABOLIC PANEL
ALT: 94 U/L — ABNORMAL HIGH (ref 12–78)
AST: 100 U/L — ABNORMAL HIGH (ref 15–37)
Albumin/Globulin Ratio: 0.7 — ABNORMAL LOW (ref 1.1–2.2)
Albumin: 2.9 g/dL — ABNORMAL LOW (ref 3.5–5.0)
Alk Phosphatase: 137 U/L — ABNORMAL HIGH (ref 45–117)
Anion Gap: 7 mmol/L (ref 5–15)
BUN/Creatinine Ratio: 9 — ABNORMAL LOW (ref 12–20)
BUN: 11 MG/DL (ref 6–20)
CO2: 24 mmol/L (ref 21–32)
Calcium: 9.6 MG/DL (ref 8.5–10.1)
Chloride: 104 mmol/L (ref 97–108)
Creatinine: 1.19 MG/DL — ABNORMAL HIGH (ref 0.55–1.02)
Est, Glom Filt Rate: 59 mL/min/{1.73_m2} — ABNORMAL LOW (ref >60)
Globulin: 4.4 g/dL — ABNORMAL HIGH (ref 2.0–4.0)
Glucose: 207 mg/dL — ABNORMAL HIGH (ref 65–100)
Potassium: 3.3 mmol/L — ABNORMAL LOW (ref 3.5–5.1)
Sodium: 135 mmol/L — ABNORMAL LOW (ref 136–145)
Total Bilirubin: 0.3 MG/DL (ref 0.2–1.0)
Total Protein: 7.3 g/dL (ref 6.4–8.2)

## 2023-03-16 LAB — POCT GLUCOSE
POC Glucose: 158 mg/dL — ABNORMAL HIGH (ref 65–117)
POC Glucose: 144 mg/dL — ABNORMAL HIGH (ref 65–117)

## 2023-03-16 LAB — CBC WITH AUTO DIFFERENTIAL
Basophils %: 1 % (ref 0–1)
Basophils Absolute: 0.1 10*3/uL (ref 0.0–0.1)
Eosinophils %: 2 % (ref 0–7)
Eosinophils Absolute: 0.2 10*3/uL (ref 0.0–0.4)
Hematocrit: 34.6 % — ABNORMAL LOW (ref 35.0–47.0)
Hemoglobin: 11.8 g/dL (ref 11.5–16.0)
Immature Granulocytes %: 0 % (ref 0.0–0.5)
Immature Granulocytes Absolute: 0 10*3/uL (ref 0.00–0.04)
Lymphocytes %: 19 % (ref 12–49)
Lymphocytes Absolute: 1.7 10*3/uL (ref 0.8–3.5)
MCH: 27.4 PG (ref 26.0–34.0)
MCHC: 34.1 g/dL (ref 30.0–36.5)
MCV: 80.3 FL (ref 80.0–99.0)
MPV: 8.8 FL — ABNORMAL LOW (ref 8.9–12.9)
Monocytes %: 5 % (ref 5–13)
Monocytes Absolute: 0.4 10*3/uL (ref 0.0–1.0)
Neutrophils %: 73 % (ref 32–75)
Neutrophils Absolute: 6.6 10*3/uL (ref 1.8–8.0)
Nucleated RBCs: 0 PER 100 WBC
Platelets: 506 10*3/uL — ABNORMAL HIGH (ref 150–400)
RBC: 4.31 M/uL (ref 3.80–5.20)
RDW: 18.3 % — ABNORMAL HIGH (ref 11.5–14.5)
WBC: 9 10*3/uL (ref 3.6–11.0)
nRBC: 0 10*3/uL (ref 0.00–0.01)

## 2023-03-16 LAB — EXTRA TUBES HOLD

## 2023-03-16 LAB — MAGNESIUM: Magnesium: 1.8 mg/dL (ref 1.6–2.4)

## 2023-03-16 LAB — HEPATITIS PANEL, ACUTE
Hep A IgM: NONREACTIVE
Hep B Core Ab, IgM: NONREACTIVE
Hep B S Ag Interp: NEGATIVE
Hep C Ab Interp: NONREACTIVE
Hepatitis B Surface Ag: <0.1 Index
Hepatitis C Ab: 0.07 Index

## 2023-03-16 LAB — LIPASE: Lipase: 27 U/L (ref 13–75)

## 2023-03-16 LAB — LACTIC ACID: Lactic Acid, Plasma: 0.6 MMOL/L (ref 0.4–2.0)

## 2023-03-16 LAB — TROPONIN: Troponin, High Sensitivity: 11 ng/L (ref 0–51)

## 2023-03-16 LAB — HCG, SERUM, QUALITATIVE: Preg, Serum: NEGATIVE

## 2023-03-16 MED ORDER — ONDANSETRON HCL 4 MG/2ML IJ SOLN
4 | Freq: Once | INTRAMUSCULAR | Status: AC
Start: 2023-03-16 — End: 2023-03-16
  Administered 2023-03-16: 12:00:00 4 mg via INTRAVENOUS

## 2023-03-16 MED ORDER — METOPROLOL SUCCINATE ER 50 MG PO TB24
50 MG | Freq: Every day | ORAL | Status: DC
Start: 2023-03-16 — End: 2023-03-20
  Administered 2023-03-16 – 2023-03-20 (×2): 50 mg via ORAL

## 2023-03-16 MED ORDER — NORMAL SALINE FLUSH 0.9 % IV SOLN
0.9 % | Freq: Two times a day (BID) | INTRAVENOUS | Status: DC
Start: 2023-03-16 — End: 2023-03-23
  Administered 2023-03-16 – 2023-03-22 (×10): 10 mL via INTRAVENOUS
  Administered 2023-03-23: 01:00:00 5 mL via INTRAVENOUS
  Administered 2023-03-23: 12:00:00 10 mL via INTRAVENOUS

## 2023-03-16 MED ORDER — POTASSIUM CHLORIDE ER 10 MEQ PO TBCR
10 MEQ | ORAL | Status: DC | PRN
Start: 2023-03-16 — End: 2023-03-21
  Administered 2023-03-19: 11:00:00 40 meq via ORAL

## 2023-03-16 MED ORDER — SODIUM CHLORIDE 0.9 % IV BOLUS
0.9 | Freq: Once | INTRAVENOUS | Status: AC
Start: 2023-03-16 — End: 2023-03-16
  Administered 2023-03-16: 13:00:00 1000 mL via INTRAVENOUS

## 2023-03-16 MED ORDER — ACETAMINOPHEN 325 MG PO TABS
325 MG | Freq: Four times a day (QID) | ORAL | Status: DC | PRN
Start: 2023-03-16 — End: 2023-03-23
  Administered 2023-03-19 (×2): 650 mg via ORAL

## 2023-03-16 MED ORDER — POTASSIUM CHLORIDE 10 MEQ/100ML IV SOLN
10100 MEQ/0ML | INTRAVENOUS | Status: DC | PRN
Start: 2023-03-16 — End: 2023-03-21

## 2023-03-16 MED ORDER — BISACODYL 10 MG RE SUPP
10 | Freq: Once | RECTAL | Status: DC
Start: 2023-03-16 — End: 2023-03-16

## 2023-03-16 MED ORDER — ONDANSETRON HCL 4 MG/2ML IJ SOLN
42 MG/2ML | Freq: Four times a day (QID) | INTRAMUSCULAR | Status: DC | PRN
Start: 2023-03-16 — End: 2023-03-23
  Administered 2023-03-16 – 2023-03-21 (×8): 4 mg via INTRAVENOUS

## 2023-03-16 MED ORDER — SODIUM CHLORIDE 0.9 % IV BOLUS
0.9 | Freq: Once | INTRAVENOUS | Status: AC
Start: 2023-03-16 — End: 2023-03-16
  Administered 2023-03-16: 14:00:00 1000 mL via INTRAVENOUS

## 2023-03-16 MED ORDER — HYDROMORPHONE 0.5MG/0.5ML IJ SOLN
1 | Freq: Four times a day (QID) | Status: DC | PRN
Start: 2023-03-16 — End: 2023-03-17
  Administered 2023-03-16 – 2023-03-17 (×4): 0.5 mg via INTRAVENOUS

## 2023-03-16 MED ORDER — METOCLOPRAMIDE HCL 5 MG/ML IJ SOLN
5 | Freq: Three times a day (TID) | INTRAMUSCULAR | Status: DC
Start: 2023-03-16 — End: 2023-03-17
  Administered 2023-03-16 – 2023-03-17 (×3): 10 mg via INTRAVENOUS

## 2023-03-16 MED ORDER — MAGNESIUM SULFATE 2000 MG/50 ML IVPB PREMIX
2 | Freq: Once | INTRAVENOUS | Status: AC
Start: 2023-03-16 — End: 2023-03-16
  Administered 2023-03-16: 18:00:00 2000 mg via INTRAVENOUS

## 2023-03-16 MED ORDER — HYDROMORPHONE HCL PF 1 MG/ML IJ SOLN
1 | INTRAMUSCULAR | Status: AC
Start: 2023-03-16 — End: 2023-03-16
  Administered 2023-03-16: 14:00:00 1 mg via INTRAVENOUS

## 2023-03-16 MED ORDER — MORPHINE SULFATE 4 MG/ML IJ SOLN
4 | INTRAMUSCULAR | Status: AC
Start: 2023-03-16 — End: 2023-03-16
  Administered 2023-03-16: 13:00:00 4 via INTRAVENOUS

## 2023-03-16 MED ORDER — LABETALOL HCL 5 MG/ML IV SOLN
5 | Freq: Once | INTRAVENOUS | Status: AC
Start: 2023-03-16 — End: 2023-03-16
  Administered 2023-03-16: 14:00:00 20 mg via INTRAVENOUS

## 2023-03-16 MED ORDER — ACETAMINOPHEN 650 MG RE SUPP
650 MG | Freq: Four times a day (QID) | RECTAL | Status: DC | PRN
Start: 2023-03-16 — End: 2023-03-23

## 2023-03-16 MED ORDER — NORMAL SALINE FLUSH 0.9 % IV SOLN
0.9 % | INTRAVENOUS | Status: DC | PRN
Start: 2023-03-16 — End: 2023-03-23
  Administered 2023-03-20 – 2023-03-21 (×5): 10 mL via INTRAVENOUS

## 2023-03-16 MED ORDER — INSULIN LISPRO 100 UNIT/ML IJ SOLN
100 UNIT/ML | Freq: Three times a day (TID) | INTRAMUSCULAR | Status: DC
Start: 2023-03-16 — End: 2023-03-22
  Administered 2023-03-18: 13:00:00 1 [IU] via SUBCUTANEOUS

## 2023-03-16 MED ORDER — MAGNESIUM SULFATE 2000 MG/50 ML IVPB PREMIX
250 GM/50ML | INTRAVENOUS | Status: DC | PRN
Start: 2023-03-16 — End: 2023-03-21
  Administered 2023-03-20: 19:00:00 2000 mg via INTRAVENOUS

## 2023-03-16 MED ORDER — SODIUM CHLORIDE 0.9 % IV SOLN
0.9 % | INTRAVENOUS | Status: DC | PRN
Start: 2023-03-16 — End: 2023-03-23

## 2023-03-16 MED ORDER — AMLODIPINE BESYLATE 5 MG PO TABS
5 MG | Freq: Every day | ORAL | Status: DC
Start: 2023-03-16 — End: 2023-03-23
  Administered 2023-03-16 – 2023-03-23 (×6): 10 mg via ORAL

## 2023-03-16 MED ORDER — POTASSIUM BICARB-CITRIC ACID 20 MEQ PO TBEF
20 MEQ | ORAL | Status: DC | PRN
Start: 2023-03-16 — End: 2023-03-21
  Administered 2023-03-20: 19:00:00 40 meq via ORAL

## 2023-03-16 MED ORDER — ONDANSETRON 4 MG PO TBDP
4 MG | Freq: Three times a day (TID) | ORAL | Status: DC | PRN
Start: 2023-03-16 — End: 2023-03-23
  Administered 2023-03-19 (×2): 4 mg via ORAL

## 2023-03-16 MED ORDER — LABETALOL HCL 5 MG/ML IV SOLN
5 | Freq: Once | INTRAVENOUS | Status: AC
Start: 2023-03-16 — End: 2023-03-16
  Administered 2023-03-16: 13:00:00 10 mg via INTRAVENOUS

## 2023-03-16 MED ORDER — LISINOPRIL 20 MG PO TABS
20 MG | Freq: Every day | ORAL | Status: DC
Start: 2023-03-16 — End: 2023-03-23
  Administered 2023-03-21 – 2023-03-23 (×3): 40 mg via ORAL

## 2023-03-16 MED ORDER — MORPHINE SULFATE (PF) 4 MG/ML IJ SOLN
4 | INTRAMUSCULAR | Status: AC
Start: 2023-03-16 — End: 2023-03-16

## 2023-03-16 MED ORDER — LABETALOL HCL 5 MG/ML IV SOLN
5 | Freq: Four times a day (QID) | INTRAVENOUS | Status: DC | PRN
Start: 2023-03-16 — End: 2023-03-17
  Administered 2023-03-16 (×2): 10 mg via INTRAVENOUS

## 2023-03-16 MED ORDER — METOCLOPRAMIDE HCL 5 MG/ML IJ SOLN
5 | INTRAMUSCULAR | Status: AC
Start: 2023-03-16 — End: 2023-03-16
  Administered 2023-03-16: 12:00:00 10 mg via INTRAVENOUS

## 2023-03-16 MED ORDER — POLYETHYLENE GLYCOL 3350 17 G PO PACK
17 g | Freq: Every day | ORAL | Status: DC | PRN
Start: 2023-03-16 — End: 2023-03-23

## 2023-03-16 MED ORDER — POTASSIUM CHLORIDE 10 MEQ/100ML IV SOLN
10 | Freq: Once | INTRAVENOUS | Status: AC
Start: 2023-03-16 — End: 2023-03-16
  Administered 2023-03-16: 14:00:00 10 meq via INTRAVENOUS

## 2023-03-16 MED ORDER — SODIUM CHLORIDE (PF) 0.9 % IJ SOLN
0.9 | Freq: Every day | INTRAMUSCULAR | Status: DC
Start: 2023-03-16 — End: 2023-03-18
  Administered 2023-03-16 – 2023-03-18 (×3): 40 mg via INTRAVENOUS

## 2023-03-16 MED ORDER — MINERAL OIL RE ENEM
Freq: Once | RECTAL | Status: DC
Start: 2023-03-16 — End: 2023-03-16

## 2023-03-16 MED ORDER — INSULIN LISPRO 100 UNIT/ML IJ SOLN
100 UNIT/ML | Freq: Every evening | INTRAMUSCULAR | Status: DC
Start: 2023-03-16 — End: 2023-03-22

## 2023-03-16 MED ORDER — CEFTRIAXONE SODIUM 1 G IJ SOLR
1 | INTRAMUSCULAR | Status: AC
Start: 2023-03-16 — End: 2023-03-18
  Administered 2023-03-16 – 2023-03-18 (×3): 1000 mg via INTRAVENOUS

## 2023-03-16 MED ORDER — ENOXAPARIN SODIUM 40 MG/0.4ML IJ SOSY
400.4 MG/0.4ML | Freq: Every day | INTRAMUSCULAR | Status: DC
Start: 2023-03-16 — End: 2023-03-23
  Administered 2023-03-17 – 2023-03-23 (×7): 40 mg via SUBCUTANEOUS

## 2023-03-16 MED ORDER — SODIUM CHLORIDE 0.9 % IV SOLN
0.9 % | INTRAVENOUS | Status: DC
Start: 2023-03-16 — End: 2023-03-20
  Administered 2023-03-16 – 2023-03-20 (×4): via INTRAVENOUS

## 2023-03-16 MED FILL — PANTOPRAZOLE SODIUM 40 MG IV SOLR: 40 MG | INTRAVENOUS | Qty: 40

## 2023-03-16 MED FILL — METOCLOPRAMIDE HCL 5 MG/ML IJ SOLN: 5 MG/ML | INTRAMUSCULAR | Qty: 2

## 2023-03-16 MED FILL — LABETALOL HCL 5 MG/ML IV SOLN: 5 MG/ML | INTRAVENOUS | Qty: 20

## 2023-03-16 MED FILL — CEFTRIAXONE SODIUM 1 G IJ SOLR: 1 g | INTRAMUSCULAR | Qty: 1000

## 2023-03-16 MED FILL — MORPHINE SULFATE 4 MG/ML IJ SOLN: 4 mg/mL | INTRAMUSCULAR | Qty: 1

## 2023-03-16 MED FILL — POTASSIUM CHLORIDE 10 MEQ/100ML IV SOLN: 10 MEQ/0ML | INTRAVENOUS | Qty: 100

## 2023-03-16 MED FILL — HYDROMORPHONE HCL 1 MG/ML IJ SOLN: 1 MG/ML | INTRAMUSCULAR | Qty: 1

## 2023-03-16 MED FILL — ONDANSETRON HCL 4 MG/2ML IJ SOLN: 4 MG/2ML | INTRAMUSCULAR | Qty: 2

## 2023-03-16 MED FILL — SODIUM CHLORIDE 0.9 % IV SOLN: 0.9 % | INTRAVENOUS | Qty: 1000

## 2023-03-16 MED FILL — BD POSIFLUSH 0.9 % IV SOLN: 0.9 % | INTRAVENOUS | Qty: 40

## 2023-03-16 MED FILL — LABETALOL HCL 5 MG/ML IV SOLN: 5 MG/ML | INTRAVENOUS | Qty: 4

## 2023-03-16 MED FILL — HYDROMORPHONE HCL 1 MG/ML IJ SOLN: 1 MG/ML | INTRAMUSCULAR | Qty: 0.5

## 2023-03-16 MED FILL — METOPROLOL SUCCINATE ER 25 MG PO TB24: 25 MG | ORAL | Qty: 2

## 2023-03-16 MED FILL — MAGNESIUM SULFATE 2 GM/50ML IV SOLN: 2 GM/50ML | INTRAVENOUS | Qty: 50

## 2023-03-16 MED FILL — AMLODIPINE BESYLATE 5 MG PO TABS: 5 MG | ORAL | Qty: 2

## 2023-03-16 MED FILL — FLEET OIL RE ENEM: RECTAL | Qty: 1

## 2023-03-16 NOTE — Care Coordination-Inpatient (Signed)
2:02 PM    CM attempted to meet with patient to complete the initial assessment but transport arrived to take her to the unit.     Melinda [name], MSW  Satira Sark [facility]  Case Manager

## 2023-03-16 NOTE — Consults (Cosign Needed)
Melinda Hemenway, PA-C                       [phone] office             Monday-Friday 8:00 am-4:30 pm  I am not permitted to use "perfect serve" use above for contact, thanks.      Gastroenterology Consultation Note      Admit Date: 03/16/2023  Consult Date: 03/16/2023   I greatly appreciate your asking me to see Melinda [name], thank you very much for the opportunity to participate in her care.    Narrative Assessment and Plan   42 year old female with past medical history of type 2 diabetes on insulin, hypertension, hypercholesterolemia, admitted 03/16/2023 with abdominal pain, nausea, vomiting.      Suspected gastroparesis on Reglan, which she takes once daily at home.      Recent H pylori infection diagnosed on EGD pathology, treated with bismuth, Flagyl, doxycycline therapy prescribed 02/14/23. She reports she has been taking this 4x daily as prescribed and had two days left.     CT abdomen pelvis from 02/17/2023 does demonstrate some fecal stasis and increased stool burden.  Hemoglobin 11.8, WBC 9.0, sodium 135, potassium 3.3, creatinine 1.19.    Impression:  Abdominal pain, nausea, and vomiting     Plan:  Start with management of constipation evidenced by large stool burden on CT 02/17/23.  I will add mineral oil enema followed by dulcolax suppository once vomiting has improved.   Give Reglan before meals and at bedtime while in the hospital, if this helps she should take it on this schedule at home.   Monitor LFTs and obtain RUQ [place] if they worsen.    Melinda Harder, PA-C    Subjective:     Chief Complaint: Abdominal Pain, Nausea, and Vomiting    History of Present Illness: Ms. Melinda [name] is a 42 year old female with past medical history of type 2 diabetes (on Novolin 70-30 insulin 15 units twice daily, last A1c 8.5 on 02/10/23), hypertension, and hypercholesterolemia, admitted 03/16/23 with abdominal pain, nausea, and vomiting.  She  takes Reglan once per day at home, and reports that she is compliant with eating small, mainly pured meals.  She reports her symptoms get better for several days at a time, then worse again.  She has a bowel movement every other day to every couple of days at home.     LFTs elevated this admission. No new medications, no alcohol or drug use. S/p cholecystectomy.     Several recent admissions for the same:     07/08/21 admitted at [facility] with abd pain, nausea, vomiting. Assessed by GI with concern for gastroparesis, diet modifications recommended.     01/14/23-01/20/23 admitted at [facility] with abd pain, nausea and vomiting and melena. EGD 01/17/23 with retained food in stomach, repeat EGD 01/18/23 unremarkable. Colonoscopy 01/19/23 with small internal hemorrhoids, Pillcam administered 01/19/23.     02/01/23-02/04/23 admitted at [facility] with abd pain, nausea, and vomiting. Abnormal HIDA scan, patient underwent cholecystectomy.     02/10/23-02/18/23 admitted at [facility] with abdominal pain. Fluid in gallbladder fossa drained by IR, culture negative.  Started on bismuth, Flagyl, doxycycline, PPI x 14 days on discharge.    02/19/23-02/20/23 admitted at [facility] for the same.  No clear etiology found at that time.    03/11/2023 to 03/14/2023 admitted to [facility] for epigastric abdominal pain, nausea, and vomiting.  Met sepsis criteria for UTI this admission, treated with IV Rocephin.  Gastric emptying study 03/14/2023 normal.  CT repeated this admission, reported as negative.      PCP:  Melinda Bradford, APRN - NP    Past Medical History:   Diagnosis Date    High blood pressure     High cholesterol     Type 2 diabetes mellitus (HCC)     Controled        Past Surgical History:   Procedure Laterality Date    CAPSULE ENDOSCOPY N/A 01/20/2023    ESOPHAGEAL CAPSULE ENDOSCOPY remove at 1624PM performed by Glyn Ade, MD at [facility] ENDOSCOPY    CHOLECYSTECTOMY, LAPAROSCOPIC N/A 02/03/2023    ROBOTIC  LAPAROSCOPIC CHOLECYSTECTOMY with Indocyanine green performed by Verdis Frederickson, MD at [facility] MAIN OR    COLONOSCOPY N/A 01/19/2023    COLONOSCOPY DIAGNOSTIC performed by Glyn Ade, MD at [facility] ENDOSCOPY    OTHER SURGICAL HISTORY Left     Rentia attachment    TUBAL LIGATION Bilateral     UPPER GASTROINTESTINAL ENDOSCOPY N/A 01/17/2023    ESOPHAGOGASTRODUODENOSCOPY performed by Glyn Ade, MD at [facility] ENDOSCOPY    UPPER GASTROINTESTINAL ENDOSCOPY N/A 01/17/2023    ESOPHAGOGASTRODUODENOSCOPY BIOPSY performed by Glyn Ade, MD at [facility] ENDOSCOPY    UPPER GASTROINTESTINAL ENDOSCOPY N/A 01/18/2023    ESOPHAGOGASTRODUODENOSCOPY performed by Glyn Ade, MD at [facility] ENDOSCOPY    [place] ABSCESS DRAINAGE PERITONEAL  02/11/2023    [place] ABSCESS DRAINAGE PERITONEAL 02/11/2023 [facility] RAD [place]       Social History     Tobacco Use    Smoking status: Never    Smokeless tobacco: Never   Substance Use Topics    Alcohol use: Never        No family history on file.     No Known Allergies         Home Medications:  Prior to Admission Medications   Prescriptions Last Dose Informant Patient Reported? Taking?   amLODIPine (NORVASC) 10 MG tablet   No No   Sig: Take 1 tablet by mouth daily   insulin 70-30 (NOVOLIN 70/30) (70-30) 100 UNIT per ML injection vial   No No   Sig: Inject 15 Units into the skin 2 times daily   lisinopril (PRINIVIL;ZESTRIL) 40 MG tablet   No No   Sig: Take 1 tablet by mouth daily   metoclopramide (REGLAN) 10 MG tablet   No No   Sig: Take 1 tablet by mouth 4 times daily as needed (nausea or vomiting)   metoprolol succinate (TOPROL XL) 50 MG extended release tablet   No No   Sig: Take 1 tablet by mouth daily   naloxone (NARCAN) 4 MG/0.1ML LIQD nasal spray   No No   Sig: 1 spray by Nasal route as needed for Opioid Reversal   ondansetron (ZOFRAN-ODT) 4 MG disintegrating tablet   No No   Sig: Take 1 tablet by mouth every 8 hours as needed for Nausea or Vomiting   pantoprazole (PROTONIX) 40 MG tablet   No No   Sig: Take 1 tablet by  mouth in the morning and at bedtime for 14 days   polyethylene glycol (GLYCOLAX) 17 g packet   No No   Sig: Take 1 packet by mouth daily as needed for Constipation      Facility-Administered Medications: None       Hospital Medications:  Current Facility-Administered Medications   Medication Dose Route Frequency    pantoprazole (PROTONIX) 40  mg in sodium chloride (PF) 0.9 % 10 mL injection  40 mg IntraVENous Daily    sodium chloride flush 0.9 % injection 5-40 mL  5-40 mL IntraVENous 2 times per day    sodium chloride flush 0.9 % injection 5-40 mL  5-40 mL IntraVENous PRN    0.9 % sodium chloride infusion   IntraVENous PRN    potassium chloride (KLOR-CON) extended release tablet 40 mEq  40 mEq Oral PRN    Or    potassium bicarb-citric acid (EFFER-K) effervescent tablet 40 mEq  40 mEq Oral PRN    Or    potassium chloride 10 mEq/100 mL IVPB (Peripheral Line)  10 mEq IntraVENous PRN    magnesium sulfate 2000 mg in 50 mL IVPB premix  2,000 mg IntraVENous PRN    enoxaparin (LOVENOX) injection 40 mg  40 mg SubCUTAneous Daily    ondansetron (ZOFRAN-ODT) disintegrating tablet 4 mg  4 mg Oral Q8H PRN    Or    ondansetron (ZOFRAN) injection 4 mg  4 mg IntraVENous Q6H PRN    polyethylene glycol (GLYCOLAX) packet 17 g  17 g Oral Daily PRN    acetaminophen (TYLENOL) tablet 650 mg  650 mg Oral Q6H PRN    Or    acetaminophen (TYLENOL) suppository 650 mg  650 mg Rectal Q6H PRN    labetalol (NORMODYNE;TRANDATE) injection 10 mg  10 mg IntraVENous Q6H PRN    metoclopramide (REGLAN) injection 10 mg  10 mg IntraVENous TID    0.9 % sodium chloride infusion   IntraVENous Continuous    HYDROmorphone (DILAUDID) injection 0.5 mg  0.5 mg IntraVENous Q6H PRN    insulin lispro (HUMALOG,ADMELOG) injection vial 0-4 Units  0-4 Units SubCUTAneous TID WC    insulin lispro (HUMALOG,ADMELOG) injection vial 0-4 Units  0-4 Units SubCUTAneous Nightly    cefTRIAXone (ROCEPHIN) 1,000 mg in sterile water 10 mL IV syringe  1,000 mg IntraVENous Q24H     magnesium sulfate 2000 mg in 50 mL IVPB premix  2,000 mg IntraVENous Once    amLODIPine (NORVASC) tablet 10 mg  10 mg Oral Daily    metoprolol succinate (TOPROL XL) extended release tablet 50 mg  50 mg Oral Daily    [Held by provider] lisinopril (PRINIVIL;ZESTRIL) tablet 40 mg  40 mg Oral Daily     Current Outpatient Medications   Medication Sig    metoclopramide (REGLAN) 10 MG tablet Take 1 tablet by mouth 4 times daily as needed (nausea or vomiting)    insulin 70-30 (NOVOLIN 70/30) (70-30) 100 UNIT per ML injection vial Inject 15 Units into the skin 2 times daily    ondansetron (ZOFRAN-ODT) 4 MG disintegrating tablet Take 1 tablet by mouth every 8 hours as needed for Nausea or Vomiting    lisinopril (PRINIVIL;ZESTRIL) 40 MG tablet Take 1 tablet by mouth daily    metoprolol succinate (TOPROL XL) 50 MG extended release tablet Take 1 tablet by mouth daily    amLODIPine (NORVASC) 10 MG tablet Take 1 tablet by mouth daily    polyethylene glycol (GLYCOLAX) 17 g packet Take 1 packet by mouth daily as needed for Constipation    pantoprazole (PROTONIX) 40 MG tablet Take 1 tablet by mouth in the morning and at bedtime for 14 days    naloxone (NARCAN) 4 MG/0.1ML LIQD nasal spray 1 spray by Nasal route as needed for Opioid Reversal       Review of Systems: Admission ROS by Horald Chestnut, DO from 03/16/2023 were reviewed with  the patient and changes (other than per HPI) include: none      Objective:     Physical Exam:  Vitals:    03/16/23 1203   BP: (!) 189/102   Pulse: (!) 104   Resp:    Temp:    SpO2:      SpO2 Readings from Last 6 Encounters:   03/16/23 99%   02/17/23 95%   02/14/23 98%   02/04/23 97%   01/20/23 98%   06/29/22 100%        No intake or output data in the 24 hours ending 03/16/23 1318   General: no distress, comfortable  Skin:  No rash or palpable dermatologic mass lesions  HEENT: Pupils equal, sclera anicteric, oropharynx with no gross lesions  Cardiovascular: No abnormal audible heart sounds, well perfused, no  edema  Respiratory:  No abnormal audible breath sounds, normal respiratory effort, no throacic deformity  GI:  Abdomen nondistended, +mild epigastric TTP, no mass, no free fluid, no rebound or guarding.  Musculoskeletal:  No skeletal deformity nor acute arthritis noted.  Neurological:  Motor and sensory function intact in upper extremeties  Psychiatric:  Normal affect, memory intact, appears to have insight into current illness    Laboratory:    Recent Results (from the past 24 hour(s))   EKG 12 Lead    Collection Time: 03/16/23  8:10 AM   Result Value Ref Range    Ventricular Rate 112 BPM    Atrial Rate 112 BPM    P-R Interval 134 ms    QRS Duration 86 ms    Q-T Interval 378 ms    QTc Calculation (Bazett) 515 ms    P Axis 71 degrees    R Axis 267 degrees    T Axis 57 degrees    Diagnosis       Sinus tachycardia  Right superior axis deviation  Possible Anterior infarct , age undetermined  Abnormal ECG     CBC with Auto Differential    Collection Time: 03/16/23  8:16 AM   Result Value Ref Range    WBC 9.0 3.6 - 11.0 K/uL    RBC 4.31 3.80 - 5.20 M/uL    Hemoglobin 11.8 11.5 - 16.0 g/dL    Hematocrit 16.1 (L) 35.0 - 47.0 %    MCV 80.3 80.0 - 99.0 FL    MCH 27.4 26.0 - 34.0 PG    MCHC 34.1 30.0 - 36.5 g/dL    RDW 09.6 (H) 04.5 - 14.5 %    Platelets 506 (H) 150 - 400 K/uL    MPV 8.8 (L) 8.9 - 12.9 FL    Nucleated RBCs 0.0 0 PER 100 WBC    nRBC 0.00 0.00 - 0.01 K/uL    Neutrophils % 73 32 - 75 %    Lymphocytes % 19 12 - 49 %    Monocytes % 5 5 - 13 %    Eosinophils % 2 0 - 7 %    Basophils % 1 0 - 1 %    Immature Granulocytes % 0 0.0 - 0.5 %    Neutrophils Absolute 6.6 1.8 - 8.0 K/UL    Lymphocytes Absolute 1.7 0.8 - 3.5 K/UL    Monocytes Absolute 0.4 0.0 - 1.0 K/UL    Eosinophils Absolute 0.2 0.0 - 0.4 K/UL    Basophils Absolute 0.1 0.0 - 0.1 K/UL    Immature Granulocytes Absolute 0.0 0.00 - 0.04 K/UL    Differential Type AUTOMATED  Comprehensive Metabolic Panel    Collection Time: 03/16/23  8:16 AM   Result Value Ref  Range    Sodium 135 (L) 136 - 145 mmol/L    Potassium 3.3 (L) 3.5 - 5.1 mmol/L    Chloride 104 97 - 108 mmol/L    CO2 24 21 - 32 mmol/L    Anion Gap 7 5 - 15 mmol/L    Glucose 207 (H) 65 - 100 mg/dL    BUN 11 6 - 20 MG/DL    Creatinine 1.61 (H) 0.55 - 1.02 MG/DL    BUN/Creatinine Ratio 9 (L) 12 - 20      Est, Glom Filt Rate 59 (L) >60 ml/min/1.50m2    Calcium 9.6 8.5 - 10.1 MG/DL    Total Bilirubin 0.3 0.2 - 1.0 MG/DL    ALT 94 (H) 12 - 78 U/L    AST 100 (H) 15 - 37 U/L    Alk Phosphatase 137 (H) 45 - 117 U/L    Total Protein 7.3 6.4 - 8.2 g/dL    Albumin 2.9 (L) 3.5 - 5.0 g/dL    Globulin 4.4 (H) 2.0 - 4.0 g/dL    Albumin/Globulin Ratio 0.7 (L) 1.1 - 2.2     Lipase    Collection Time: 03/16/23  8:16 AM   Result Value Ref Range    Lipase 27 13 - 75 U/L   Magnesium    Collection Time: 03/16/23  8:16 AM   Result Value Ref Range    Magnesium 1.8 1.6 - 2.4 mg/dL   Troponin    Collection Time: 03/16/23  8:16 AM   Result Value Ref Range    Troponin, High Sensitivity 11 0 - 51 ng/L   HCG Qualitative, Serum    Collection Time: 03/16/23  8:16 AM   Result Value Ref Range    Preg, Serum Negative NEG     Extra Tubes Hold    Collection Time: 03/16/23  8:19 AM   Result Value Ref Range    Specimen HOld BLUE, RED, SST     Comment:        Add-on orders for these samples will be processed based on acceptable specimen integrity and analyte stability, which may vary by analyte.   Lactic Acid    Collection Time: 03/16/23  8:19 AM   Result Value Ref Range    Lactic Acid, Plasma 0.6 0.4 - 2.0 MMOL/L         Assessment/Plan:     Principal Problem:    Intractable nausea and vomiting  Resolved Problems:    * No resolved hospital problems. *       See above narrative for full detail.

## 2023-03-16 NOTE — Consults (Incomplete)
Cambre Matson, PA-C                       [phone] office             Monday-Friday 8:00 am-4:30 pm  I am not permitted to use "perfect serve" use above for contact, thanks.      Gastroenterology Consultation Note      Admit Date: 03/16/2023  Consult Date: 03/16/2023   I greatly appreciate your asking me to see Melinda [name], thank you very much for the opportunity to participate in her care.    Narrative Assessment and Plan   ***    Impression:    Plan:     [name], PA-C    Subjective:     Chief Complaint: Abdominal Pain, Nausea, and Vomiting    History of Present Illness: [name] is a 42 year old female with past medical history of type 2 diabetes (on Novolin 70-30 insulin 15 units twice daily, last A1c 8.5 on 02/10/23), hypertension, and hypercholesterolemia, admitted 03/16/23 with abdominal pain, nausea, and vomiting. Several recent admissions for the same:     07/08/21 admitted at [facility] with abd pain, nausea, vomiting. Assessed by GI with concern for gastroparesis, diet modifications recommended.     01/14/23-01/20/23 admitted at [facility] with abd pain, nausea and vomiting and melena. EGD 01/17/23 with retained food in stomach, repeat EGD 01/18/23 unremarkable. Colonoscopy 01/19/23 with small internal hemorrhoids, Pillcam administered 01/19/23.     02/01/23-02/04/23 admitted at [facility] with abd pain, nausea, and vomiting. Abnormal HIDA scan, patient underwent cholecystectomy.     02/10/23-02/18/23 admitted at [facility] with abdominal pain. Fluid in gallbladder fossa drained by IR,     PCP:  Idowu, Funmilola, APRN - NP    Past Medical History:   Diagnosis Date    High blood pressure     High cholesterol     Type 2 diabetes mellitus (HCC)     Controled        Past Surgical History:   Procedure Laterality Date    CAPSULE ENDOSCOPY N/A 01/20/2023    ESOPHAGEAL CAPSULE ENDOSCOPY remove at 1624PM performed by Glyn Ade, MD at [facility] ENDOSCOPY    CHOLECYSTECTOMY, LAPAROSCOPIC N/A 02/03/2023    ROBOTIC LAPAROSCOPIC CHOLECYSTECTOMY with Indocyanine green performed by Verdis Frederickson, MD at [facility] MAIN OR    COLONOSCOPY N/A 01/19/2023    COLONOSCOPY DIAGNOSTIC performed by Glyn Ade, MD at [facility] ENDOSCOPY    OTHER SURGICAL HISTORY Left     Rentia attachment    TUBAL LIGATION Bilateral     UPPER GASTROINTESTINAL ENDOSCOPY N/A 01/17/2023    ESOPHAGOGASTRODUODENOSCOPY performed by Glyn Ade, MD at [facility] ENDOSCOPY    UPPER GASTROINTESTINAL ENDOSCOPY N/A 01/17/2023    ESOPHAGOGASTRODUODENOSCOPY BIOPSY performed by Glyn Ade, MD at [facility] ENDOSCOPY    UPPER GASTROINTESTINAL ENDOSCOPY N/A 01/18/2023    ESOPHAGOGASTRODUODENOSCOPY performed by Glyn Ade, MD at [facility] ENDOSCOPY    [place] ABSCESS DRAINAGE PERITONEAL  02/11/2023    [place] ABSCESS DRAINAGE PERITONEAL 02/11/2023 [facility] RAD [place]       Social History     Tobacco Use    Smoking status: Never    Smokeless tobacco: Never   Substance Use Topics    Alcohol use: Never        No family history on file.     No Known Allergies         Home Medications:  Prior to  Admission Medications   Prescriptions Last Dose Informant Patient Reported? Taking?   amLODIPine (NORVASC) 10 MG tablet   No No   Sig: Take 1 tablet by mouth daily   insulin 70-30 (NOVOLIN 70/30) (70-30) 100 UNIT per ML injection vial   No No   Sig: Inject 15 Units into the skin 2 times daily   lisinopril (PRINIVIL;ZESTRIL) 40 MG tablet   No No   Sig: Take 1 tablet by mouth daily   metoclopramide (REGLAN) 10 MG tablet   No No   Sig: Take 1 tablet by mouth 4 times daily as needed (nausea or vomiting)   metoprolol succinate (TOPROL XL) 50 MG extended release tablet   No No   Sig: Take 1 tablet by mouth daily   naloxone (NARCAN) 4 MG/0.1ML LIQD nasal spray   No No   Sig: 1 spray by Nasal route as needed for Opioid Reversal   ondansetron (ZOFRAN-ODT) 4 MG disintegrating tablet   No No   Sig: Take 1 tablet by mouth every 8 hours as needed for Nausea  or Vomiting   pantoprazole (PROTONIX) 40 MG tablet   No No   Sig: Take 1 tablet by mouth in the morning and at bedtime for 14 days   polyethylene glycol (GLYCOLAX) 17 g packet   No No   Sig: Take 1 packet by mouth daily as needed for Constipation      Facility-Administered Medications: None       Hospital Medications:  Current Facility-Administered Medications   Medication Dose Route Frequency    pantoprazole (PROTONIX) 40 mg in sodium chloride (PF) 0.9 % 10 mL injection  40 mg IntraVENous Daily    sodium chloride flush 0.9 % injection 5-40 mL  5-40 mL IntraVENous 2 times per day    sodium chloride flush 0.9 % injection 5-40 mL  5-40 mL IntraVENous PRN    0.9 % sodium chloride infusion   IntraVENous PRN    potassium chloride (KLOR-CON) extended release tablet 40 mEq  40 mEq Oral PRN    Or    potassium bicarb-citric acid (EFFER-K) effervescent tablet 40 mEq  40 mEq Oral PRN    Or    potassium chloride 10 mEq/100 mL IVPB (Peripheral Line)  10 mEq IntraVENous PRN    magnesium sulfate 2000 mg in 50 mL IVPB premix  2,000 mg IntraVENous PRN    enoxaparin (LOVENOX) injection 40 mg  40 mg SubCUTAneous Daily    ondansetron (ZOFRAN-ODT) disintegrating tablet 4 mg  4 mg Oral Q8H PRN    Or    ondansetron (ZOFRAN) injection 4 mg  4 mg IntraVENous Q6H PRN    polyethylene glycol (GLYCOLAX) packet 17 g  17 g Oral Daily PRN    acetaminophen (TYLENOL) tablet 650 mg  650 mg Oral Q6H PRN    Or    acetaminophen (TYLENOL) suppository 650 mg  650 mg Rectal Q6H PRN    labetalol (NORMODYNE;TRANDATE) injection 10 mg  10 mg IntraVENous Q6H PRN     Current Outpatient Medications   Medication Sig    metoclopramide (REGLAN) 10 MG tablet Take 1 tablet by mouth 4 times daily as needed (nausea or vomiting)    insulin 70-30 (NOVOLIN 70/30) (70-30) 100 UNIT per ML injection vial Inject 15 Units into the skin 2 times daily    ondansetron (ZOFRAN-ODT) 4 MG disintegrating tablet Take 1 tablet by mouth every 8 hours as needed for Nausea or Vomiting     lisinopril (PRINIVIL;ZESTRIL) 40 MG  tablet Take 1 tablet by mouth daily    metoprolol succinate (TOPROL XL) 50 MG extended release tablet Take 1 tablet by mouth daily    amLODIPine (NORVASC) 10 MG tablet Take 1 tablet by mouth daily    polyethylene glycol (GLYCOLAX) 17 g packet Take 1 packet by mouth daily as needed for Constipation    pantoprazole (PROTONIX) 40 MG tablet Take 1 tablet by mouth in the morning and at bedtime for 14 days    naloxone (NARCAN) 4 MG/0.1ML LIQD nasal spray 1 spray by Nasal route as needed for Opioid Reversal       Review of Systems: Admission ROS by Horald Chestnut, DO from 03/16/2023 were reviewed with the patient and changes (other than per HPI) include: {ros [id number]::[email or web address]}      Objective:     Physical Exam:  Vitals:    03/16/23 1203   BP: (!) 189/102   Pulse: (!) 104   Resp:    Temp:    SpO2:      SpO2 Readings from Last 6 Encounters:   03/16/23 99%   02/17/23 95%   02/14/23 98%   02/04/23 97%   01/20/23 98%   06/29/22 100%        No intake or output data in the 24 hours ending 03/16/23 1222   General: no distress, comfortable  Skin:  No rash or palpable dermatologic mass lesions  HEENT: Pupils equal, sclera anicteric, oropharynx with no gross lesions  Cardiovascular: No abnormal audible heart sounds, well perfused, no edema  Respiratory:  No abnormal audible breath sounds, normal respiratory effort, no throacic deformity  GI:  ***, Abdomen nondistended, nontender, no mass, no free fluid, no rebound or guarding.  Musculoskeletal:  No skeletal deformity nor acute arthritis noted.  Neurological:  Motor and sensory function intact in upper extremeties  Psychiatric:  Normal affect, memory intact, appears to have insight into current illness  Lymphatic:  No cervical, supraclavicular, or periumbilic lymphadenopathy    Laboratory:    Recent Results (from the past 24 hour(s))   EKG 12 Lead    Collection Time: 03/16/23  8:10 AM   Result Value Ref Range    Ventricular Rate 112 BPM    Atrial Rate  112 BPM    P-R Interval 134 ms    QRS Duration 86 ms    Q-T Interval 378 ms    QTc Calculation (Bazett) 515 ms    P Axis 71 degrees    R Axis 267 degrees    T Axis 57 degrees    Diagnosis       Sinus tachycardia  Right superior axis deviation  Possible Anterior infarct , age undetermined  Abnormal ECG     CBC with Auto Differential    Collection Time: 03/16/23  8:16 AM   Result Value Ref Range    WBC 9.0 3.6 - 11.0 K/uL    RBC 4.31 3.80 - 5.20 M/uL    Hemoglobin 11.8 11.5 - 16.0 g/dL    Hematocrit 16.1 (L) 35.0 - 47.0 %    MCV 80.3 80.0 - 99.0 FL    MCH 27.4 26.0 - 34.0 PG    MCHC 34.1 30.0 - 36.5 g/dL    RDW 09.6 (H) 04.5 - 14.5 %    Platelets 506 (H) 150 - 400 K/uL    MPV 8.8 (L) 8.9 - 12.9 FL    Nucleated RBCs 0.0 0 PER 100 WBC    nRBC 0.00 0.00 - 0.01 K/uL  Neutrophils % 73 32 - 75 %    Lymphocytes % 19 12 - 49 %    Monocytes % 5 5 - 13 %    Eosinophils % 2 0 - 7 %    Basophils % 1 0 - 1 %    Immature Granulocytes % 0 0.0 - 0.5 %    Neutrophils Absolute 6.6 1.8 - 8.0 K/UL    Lymphocytes Absolute 1.7 0.8 - 3.5 K/UL    Monocytes Absolute 0.4 0.0 - 1.0 K/UL    Eosinophils Absolute 0.2 0.0 - 0.4 K/UL    Basophils Absolute 0.1 0.0 - 0.1 K/UL    Immature Granulocytes Absolute 0.0 0.00 - 0.04 K/UL    Differential Type AUTOMATED     Comprehensive Metabolic Panel    Collection Time: 03/16/23  8:16 AM   Result Value Ref Range    Sodium 135 (L) 136 - 145 mmol/L    Potassium 3.3 (L) 3.5 - 5.1 mmol/L    Chloride 104 97 - 108 mmol/L    CO2 24 21 - 32 mmol/L    Anion Gap 7 5 - 15 mmol/L    Glucose 207 (H) 65 - 100 mg/dL    BUN 11 6 - 20 MG/DL    Creatinine 4.09 (H) 0.55 - 1.02 MG/DL    BUN/Creatinine Ratio 9 (L) 12 - 20      Est, Glom Filt Rate 59 (L) >60 ml/min/1.42m2    Calcium 9.6 8.5 - 10.1 MG/DL    Total Bilirubin 0.3 0.2 - 1.0 MG/DL    ALT 94 (H) 12 - 78 U/L    AST 100 (H) 15 - 37 U/L    Alk Phosphatase 137 (H) 45 - 117 U/L    Total Protein 7.3 6.4 - 8.2 g/dL    Albumin 2.9 (L) 3.5 - 5.0 g/dL    Globulin 4.4 (H) 2.0 -  4.0 g/dL    Albumin/Globulin Ratio 0.7 (L) 1.1 - 2.2     Lipase    Collection Time: 03/16/23  8:16 AM   Result Value Ref Range    Lipase 27 13 - 75 U/L   Magnesium    Collection Time: 03/16/23  8:16 AM   Result Value Ref Range    Magnesium 1.8 1.6 - 2.4 mg/dL   Troponin    Collection Time: 03/16/23  8:16 AM   Result Value Ref Range    Troponin, High Sensitivity 11 0 - 51 ng/L   HCG Qualitative, Serum    Collection Time: 03/16/23  8:16 AM   Result Value Ref Range    Preg, Serum Negative NEG     Extra Tubes Hold    Collection Time: 03/16/23  8:19 AM   Result Value Ref Range    Specimen HOld BLUE, RED, SST     Comment:        Add-on orders for these samples will be processed based on acceptable specimen integrity and analyte stability, which may vary by analyte.   Lactic Acid    Collection Time: 03/16/23  8:19 AM   Result Value Ref Range    Lactic Acid, Plasma 0.6 0.4 - 2.0 MMOL/L         Assessment/Plan:     Principal Problem:    Intractable nausea and vomiting  Resolved Problems:    * No resolved hospital problems. *       See above narrative for full detail.  ***

## 2023-03-16 NOTE — Progress Notes (Signed)
Called for report at 11:22am was left on hold for 10 minutes. Will await a call for report

## 2023-03-16 NOTE — Plan of Care (Signed)
Problem: Safety - Adult  Goal: Free from fall injury  Outcome: Progressing     Problem: Pain  Goal: Verbalizes/displays adequate comfort level or baseline comfort level  Outcome: Progressing

## 2023-03-16 NOTE — ED Triage Notes (Addendum)
Patient to ER for complaints of N/V and abdominal pain x 1 week. Reports constipation and last BM 03/15/23.    Patient reports recent admission for same complaints; discharged yesterday.     Patient reports dysuria x 1 week.     Denies fever/chills.     Denies SOB and chest pain.     Provider in triage to assess patient.

## 2023-03-16 NOTE — ED Provider Notes (Cosign Needed)
[facility] EMERGENCY DEPT  EMERGENCY DEPARTMENT ENCOUNTER      Pt Name: Melinda [name]  MRN: [id number]  Birthdate 1981/04/30  Date of evaluation: 03/16/2023  Provider: Tyson Babinski, PA-C    CHIEF COMPLAINT       Chief Complaint   Patient presents with    Abdominal Pain    Nausea    Emesis         HISTORY OF PRESENT ILLNESS   (Location/Symptom, Timing/Onset, Context/Setting, Quality, Duration, Modifying Factors, Severity)  Note limiting factors.   42yo female with PMH significant for T2DM, HTN, and CKD III, multiple hospital visits in the past 2 months for ABD pain, discharged yesterday from [facility] (who also has Epic so can review ED/hospital summary) returns to the ED c/o return of epigastric ABD pain, nausea and vomiting x last night. She states this is the same pain she has felt previously when she comes to the ED and is typically admitted for AKI, hypokalemia and most recently admitted at [facility] from 5/31 to 6/04 for epigastric pain, N/V and also found to have sepsis 2/2 Klebsiella UTI and was treated with IV Rocephin and transitioned to PO Kelfex, last dose was scheduled for today but has vomited since last night and not taken her meds. There is a question of compliance with her medical history. She denies tobacco, ETOH or marijuana use. Last [facility] visit on 5/09 yielded an unremarkable ABD/pelvis CT and improvement of symptoms after IV droperidol given. This has been an ongoing issue, presumed gastroparesis due to diabetes but gastric emptying study on 6/03 was normal. She received an EGD, diagnostic EGD and colonoscopy and capsule study in April, and were otherwise normal except she was found to have H. Pylori and prescribed quadruple therapy post-hospital discharge on 5/06 (bismuth, flagyl, doxycycline, PPI x 14 days).   No clear etiology has been found for her ABD pain.     Per hospital discharge summary:  "On admission on 5/31, patient met sepsis  criteria with a UTI being the source. She was placed on IV Rocephin. Her UCx grew K.pneumoniae. She was transitioned to PO Keflex. BCx remained negative. She had a pre-renal AKI that resolved with IVF. In regards to her chronic abdominal pain, she was placed on IV Reglan given concern for possible diabetic gastroparesis. A gastric emptying study was ordered on admission but could not be done until 03/14/23 due to not being able to get the radioactive nucleotide until then. That was normal. A repeat CT Abdomen was done due to persistent pain and nausea but that was also negative. Her pain and nausea did improve and she eventually requested to be discharged back home. She will be referred to GI on discharge for further evaluation of her chronic abdominal pain."                Review of External Medical Records:     Nursing Notes were reviewed.    REVIEW OF SYSTEMS    (2-9 systems for level 4, 10 or more for level 5)     Review of Systems    Except as noted above the remainder of the review of systems was reviewed and negative.       PAST MEDICAL HISTORY     Past Medical History:   Diagnosis Date    High blood pressure     High cholesterol     Type 2 diabetes mellitus (HCC)     Controled  SURGICAL HISTORY       Past Surgical History:   Procedure Laterality Date    CAPSULE ENDOSCOPY N/A 01/20/2023    ESOPHAGEAL CAPSULE ENDOSCOPY remove at 1624PM performed by Glyn Ade, MD at [facility] ENDOSCOPY    CHOLECYSTECTOMY, LAPAROSCOPIC N/A 02/03/2023    ROBOTIC LAPAROSCOPIC CHOLECYSTECTOMY with Indocyanine green performed by Verdis Frederickson, MD at [facility] MAIN OR    COLONOSCOPY N/A 01/19/2023    COLONOSCOPY DIAGNOSTIC performed by Glyn Ade, MD at [facility] ENDOSCOPY    OTHER SURGICAL HISTORY Left     Rentia attachment    TUBAL LIGATION Bilateral     UPPER GASTROINTESTINAL ENDOSCOPY N/A 01/17/2023    ESOPHAGOGASTRODUODENOSCOPY performed by Glyn Ade, MD at [facility] ENDOSCOPY    UPPER GASTROINTESTINAL ENDOSCOPY N/A 01/17/2023     ESOPHAGOGASTRODUODENOSCOPY BIOPSY performed by Glyn Ade, MD at [facility] ENDOSCOPY    UPPER GASTROINTESTINAL ENDOSCOPY N/A 01/18/2023    ESOPHAGOGASTRODUODENOSCOPY performed by Glyn Ade, MD at [facility] ENDOSCOPY    [place] ABSCESS DRAINAGE PERITONEAL  02/11/2023    [place] ABSCESS DRAINAGE PERITONEAL 02/11/2023 [facility] RAD [place]         CURRENT MEDICATIONS       Previous Medications    AMLODIPINE (NORVASC) 10 MG TABLET    Take 1 tablet by mouth daily    INSULIN 70-30 (NOVOLIN 70/30) (70-30) 100 UNIT PER ML INJECTION VIAL    Inject 15 Units into the skin 2 times daily    LISINOPRIL (PRINIVIL;ZESTRIL) 40 MG TABLET    Take 1 tablet by mouth daily    METOCLOPRAMIDE (REGLAN) 10 MG TABLET    Take 1 tablet by mouth 4 times daily as needed (nausea or vomiting)    METOPROLOL SUCCINATE (TOPROL XL) 50 MG EXTENDED RELEASE TABLET    Take 1 tablet by mouth daily    NALOXONE (NARCAN) 4 MG/0.1ML LIQD NASAL SPRAY    1 spray by Nasal route as needed for Opioid Reversal    ONDANSETRON (ZOFRAN-ODT) 4 MG DISINTEGRATING TABLET    Take 1 tablet by mouth every 8 hours as needed for Nausea or Vomiting    PANTOPRAZOLE (PROTONIX) 40 MG TABLET    Take 1 tablet by mouth in the morning and at bedtime for 14 days    POLYETHYLENE GLYCOL (GLYCOLAX) 17 G PACKET    Take 1 packet by mouth daily as needed for Constipation       ALLERGIES     Patient has no known allergies.    FAMILY HISTORY     No family history on file.       SOCIAL HISTORY       Social History     Socioeconomic History    Marital status: Married   Tobacco Use    Smoking status: Never    Smokeless tobacco: Never   Vaping Use    Vaping Use: Never used   Substance and Sexual Activity    Alcohol use: Never    Drug use: Never     Social Determinants of Health     Food Insecurity: Food Insecurity Present (02/10/2023)    Hunger Vital Sign     Worried About Running Out of Food in the Last Year: Sometimes true     Ran Out of Food in the Last Year: Sometimes true   Transportation Needs: No Transportation Needs  (02/10/2023)    PRAPARE - Therapist, art (Medical): No     Lack of Transportation (Non-Medical): No   Housing Stability:  Low Risk  (02/10/2023)    Housing Stability Vital Sign     Unable to Pay for Housing in the Last Year: No     Number of Places Lived in the Last Year: 1     Unstable Housing in the Last Year: No           PHYSICAL EXAM    (up to 7 for level 4, 8 or more for level 5)     ED Triage Vitals [03/16/23 0800]   BP Temp Temp Source Pulse Respirations SpO2 Height Weight - Scale   (!) 187/154 98.6 F (37 C) Oral (!) 116 20 100 % 1.499 m (4\' 11" ) 61.2 kg (135 lb)       Body mass index is 27.27 kg/m.    Physical Exam  Vitals and nursing note reviewed.   Constitutional:       Appearance: She is well-developed.   Cardiovascular:      Rate and Rhythm: Tachycardia present.      Pulses: Normal pulses.      Heart sounds: Normal heart sounds. No murmur heard.     No friction rub. No gallop.   Pulmonary:      Effort: Pulmonary effort is normal. No respiratory distress.      Breath sounds: Normal breath sounds. No stridor. No wheezing, rhonchi or rales.   Abdominal:      General: Abdomen is flat. There is no distension.      Comments: General TTP to the epigastric region with no rigidity or guarding.   Musculoskeletal:         General: Normal range of motion.   Skin:     General: Skin is warm.      Capillary Refill: Capillary refill takes less than 2 seconds.   Neurological:      General: No focal deficit present.      Mental Status: She is alert and oriented to person, place, and time.         DIAGNOSTIC RESULTS     EKG: All EKG's are interpreted by the Emergency Department Physician who either signs or Co-signs this chart in the absence of a cardiologist.        RADIOLOGY:   Non-plain film images such as CT, Ultrasound and MRI are read by the radiologist. Plain radiographic images are visualized and preliminarily interpreted by the emergency physician with the below  findings:        Interpretation per the Radiologist below, if available at the time of this note:    XR CHEST PORTABLE   Final Result      No acute process on portable chest.              LABS:  Labs Reviewed   CBC WITH AUTO DIFFERENTIAL - Abnormal; Notable for the following components:       Result Value    Hematocrit 34.6 (*)     RDW 18.3 (*)     Platelets 506 (*)     MPV 8.8 (*)     All other components within normal limits   COMPREHENSIVE METABOLIC PANEL - Abnormal; Notable for the following components:    Sodium 135 (*)     Potassium 3.3 (*)     Glucose 207 (*)     Creatinine 1.19 (*)     BUN/Creatinine Ratio 9 (*)     Est, Glom Filt Rate 59 (*)     ALT 94 (*)     AST 100 (*)  Alk Phosphatase 137 (*)     Albumin 2.9 (*)     Globulin 4.4 (*)     Albumin/Globulin Ratio 0.7 (*)     All other components within normal limits   CULTURE, BLOOD 1   CULTURE, BLOOD 2   LIPASE   MAGNESIUM   TROPONIN   HCG, SERUM, QUALITATIVE   EXTRA TUBES HOLD   LACTIC ACID   URINALYSIS WITH MICROSCOPIC   URINE DRUG SCREEN   EXTRA TUBE, BLOOD BANK   POCT LACTIC ACID       All other labs were within normal range or not returned as of this dictation.    EMERGENCY DEPARTMENT COURSE and DIFFERENTIAL DIAGNOSIS/MDM:   Vitals:    Vitals:    03/16/23 0800 03/16/23 0819 03/16/23 0849 03/16/23 0933   BP: (!) 187/154  (!) 191/98 (!) 195/89   Pulse: (!) 116 (!) 118  (!) 103   Resp: 20   15   Temp: 98.6 F (37 C)      TempSrc: Oral      SpO2: 100%   99%   Weight: 61.2 kg (135 lb)      Height: 1.499 m (4\' 11" )              Medical Decision Making    Ddx: gastroparesis, sepsis, UTI, dehydration, GERD, acute on chronic abd pain, electrolyte abnormality    Amount and/or Complexity of Data Reviewed  Labs: ordered.  Radiology: ordered.  ECG/medicine tests: ordered.    Risk  Prescription drug management.  Decision regarding hospitalization.            REASSESSMENT     ED Course as of 03/16/23 1022   Wed Mar 16, 2023   1610 EKG: Sinus tachycardia; rate of  112; anterior Q waves; nonspecific ST, T wave abnormalities.  Interpreted by me.  8:10 AM  Einar Grad, MD   [RG]      ED Course User Index  [RG] Catalina Pizza, MD       Perfect Serve Consult for Admission  10:22 AM    ED Room Number: ER10/10  Patient Name and age:  Melinda [name] 42 y.o.  female  Working Diagnosis:   1. Intractable epigastric abdominal pain    2. Nausea and vomiting, unspecified vomiting type    3. Hypokalemia    4. 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 Admitted 5/31 at [facility] for return of symptoms, at that time met sepsis criteria, had Klebsiella UTI, given Rocephin and Keflex per culture sensitivity, also had CT on 5/31 showing moderate to severe constipation and bladder wall thickening and again CT ABD yesterday on 6/04 showing no acute process. States symptoms returned again last night and has been vomiting. Given fluid bolus, protonix, Reglan, Zofran and morphine, still c/o pain and vomiting. BP 190/90s, labetalol ordered. Last admission there was question of compliance to her home meds.   Did not repeat CT today as she had a CT yesterday which was normal, I can review the lab and CT results from her hospital admission yesterday  in Epic. She has had multiple negative CTs in our system since April 2024.         CONSULTS:  None    PROCEDURES:  Unless otherwise noted below, none     Procedures      FINAL IMPRESSION      1. Intractable epigastric abdominal pain    2. Nausea and vomiting, unspecified vomiting type    3. Hypokalemia    4. Hypertensive urgency           DISPOSITION/PLAN   DISPOSITION Decision To Admit 03/16/2023 10:13:37 AM      PATIENT REFERRED TO:  No follow-up provider specified.    DISCHARGE MEDICATIONS:  New Prescriptions    No medications on file         (Please note that portions of this note were completed with a voice recognition program.  Efforts were made to edit the dictations but occasionally words are mis-transcribed.)    Tyson Babinski, PA-C (electronically signed)  Emergency Attending Physician / Physician Assistant / Nurse Practitioner             Ezekiel Slocumb, PA-C  03/16/23 1022

## 2023-03-16 NOTE — H&P (Signed)
[facility]  [address] [place], [place], [place] [address]  [phone]    Admission History and Physical      NAME:  Melinda [name]   DOB:   [birth year]   MRN:  [id number]     PCP:  Orlean Bradford, APRN - NP     Date/Time of service:  03/16/2023  12:50 PM        Subjective:     CHIEF COMPLAINT: abd pain and n/v    HISTORY OF PRESENT ILLNESS:     Melinda [name] is a 42 y.o.  female with hx of DM, Htn and CKD stage 3 who is admitted with  abdominal pain and nausea and vmomitting .  Melinda [name] pain yesterday she began to experience severe abdominal pain in her epigastric region with associated nausea and vomiting.  She denies any fevers or chills.  She endorses some intermittent diarrhea.  She endorses some associated lightheadedness.  Patient has had similar episodes of this pain.  Of note, patient was been admitted to [facility] recently due to this pain.  In April she underwent EGD and colonoscopy and capsule study which were unremarkable except for testing positive for H. pylori for which she completed quadruple treatment.  She also recently underwent a gastroparesis study last month which was negative however she was on Reglan at that time and does believe this could have affected her results.  Of note, patient was also discharged a few days ago from [facility] in which time she was found to be positive for UTI and sent home on antibiotics; she states that she has 2 more days of antibiotics that she has not yet completed.      No Known Allergies    Prior to Admission medications    Medication Sig Start Date End Date Taking? Authorizing Provider   metoclopramide (REGLAN) 10 MG tablet Take 1 tablet by mouth 4 times daily as needed (nausea or vomiting) 02/17/23   Hutchinson, Judeth Cornfield, DO   insulin 70-30 (NOVOLIN 70/30) (70-30) 100 UNIT per ML injection vial Inject 15 Units into the skin 2 times daily 02/14/23 03/16/23  Dobransky, Kathlen Mody, MD   ondansetron  (ZOFRAN-ODT) 4 MG disintegrating tablet Take 1 tablet by mouth every 8 hours as needed for Nausea or Vomiting 02/14/23   Dobransky, Kathlen Mody, MD   lisinopril (PRINIVIL;ZESTRIL) 40 MG tablet Take 1 tablet by mouth daily 02/14/23   Dobransky, Kathlen Mody, MD   metoprolol succinate (TOPROL XL) 50 MG extended release tablet Take 1 tablet by mouth daily 02/14/23   Dobransky, Kathlen Mody, MD   amLODIPine (NORVASC) 10 MG tablet Take 1 tablet by mouth daily 02/14/23   Dobransky, Kathlen Mody, MD   polyethylene glycol (GLYCOLAX) 17 g packet Take 1 packet by mouth daily as needed for Constipation 02/14/23 03/16/23  Gaspar Bidding, MD   pantoprazole (PROTONIX) 40 MG tablet Take 1 tablet by mouth in the morning and at bedtime for 14 days 02/14/23 02/28/23  Gaspar Bidding, MD   naloxone [facility]) 4 MG/0.1ML LIQD nasal spray 1 spray by Nasal route as needed for Opioid Reversal 01/20/23   Horald Chestnut, DO       Past Medical History:   Diagnosis Date    High blood pressure     High cholesterol     Type 2 diabetes mellitus (HCC)     Controled        Past Surgical History:   Procedure  Laterality Date    CAPSULE ENDOSCOPY N/A 01/20/2023    ESOPHAGEAL CAPSULE ENDOSCOPY remove at 1624PM performed by Glyn Ade, MD at [facility] ENDOSCOPY    CHOLECYSTECTOMY, LAPAROSCOPIC N/A 02/03/2023    ROBOTIC LAPAROSCOPIC CHOLECYSTECTOMY with Indocyanine green performed by Verdis Frederickson, MD at [facility] MAIN OR    COLONOSCOPY N/A 01/19/2023    COLONOSCOPY DIAGNOSTIC performed by Glyn Ade, MD at [facility] ENDOSCOPY    OTHER SURGICAL HISTORY Left     Rentia attachment    TUBAL LIGATION Bilateral     UPPER GASTROINTESTINAL ENDOSCOPY N/A 01/17/2023    ESOPHAGOGASTRODUODENOSCOPY performed by Glyn Ade, MD at [facility] ENDOSCOPY    UPPER GASTROINTESTINAL ENDOSCOPY N/A 01/17/2023    ESOPHAGOGASTRODUODENOSCOPY BIOPSY performed by Glyn Ade, MD at [facility] ENDOSCOPY    UPPER GASTROINTESTINAL ENDOSCOPY N/A 01/18/2023    ESOPHAGOGASTRODUODENOSCOPY performed by Glyn Ade, MD at [facility]  ENDOSCOPY    [place] ABSCESS DRAINAGE PERITONEAL  02/11/2023    [place] ABSCESS DRAINAGE PERITONEAL 02/11/2023 [facility] RAD [place]       Social History     Tobacco Use    Smoking status: Never    Smokeless tobacco: Never   Substance Use Topics    Alcohol use: Never        No family history on file.     Review of Systems:  Per HPI          Objective:      VITALS:    Vital signs reviewed; most recent are:    Vitals:    03/16/23 1203   BP: (!) 189/102   Pulse: (!) 104   Resp:    Temp:    SpO2:      SpO2 Readings from Last 6 Encounters:   03/16/23 99%   02/17/23 95%   02/14/23 98%   02/04/23 97%   01/20/23 98%   06/29/22 100%        No intake or output data in the 24 hours ending 03/16/23 1250     Exam:     Physical Exam:    Gen:  Well-developed, well-nourished, in no acute distress  HEENT:  Pink conjunctivae, PERRL, hearing intact to voice  Resp:  No accessory muscle use, clear breath sounds without wheezes rales or rhonchi  Card:  tachycardia, No murmurs, normal S1, S2, no peripheral edema  Abd:  abd TTP, non-distended, normoactive bowel sounds are present  Musc:  No cyanosis or clubbing  Skin:  No rashes or ulcers, skin turgor is good  Neuro:  Cranial nerves 3-12 are grossly intact, follows commands appropriately  Psych:  Oriented to person, place, and time, Alert with good insight      Labs:    Recent Labs     03/16/23  0816   WBC 9.0   HGB 11.8   HCT 34.6*   PLT 506*     Recent Labs     03/16/23  0816   NA 135*   K 3.3*   CL 104   CO2 24   BUN 11   MG 1.8   ALT 94*     No results found for: "GLUCPOC"  No results for input(s): "PH", "PCO2", "PO2", "HCO3", "FIO2" in the last 72 hours.  No results for input(s): "INR" in the last 72 hours.    Radiology and EKG reviewed:   CXR no acute concerns      Old Records reviewed in [facility]       Assessment/Plan:  Acute abdominal pain/intractable nausea vomiting POA: Possible gastroparesis flare.  Repeat CT abdomen pelvis.  Recent gastric emptying study negative, patient was on Reglan at that  time which may have affected results.  Check stool cultures if any diarrhea.  Lipase within normal limits.  IVF.  IV PPI. IV Reglan. IV antiemetics as needed. Iv dilaudid prn. Consult GI.    Hypertensive urgency/history of hypertension POA: Suspect has been noncompliant with medications due to nausea vomiting.  Amlodipine and metoprolol as tolerated; hold ACE due to n/v. Continue IV labetalol as needed.    Elevated LFTs/ Alk phos POA: Check CT abdomen pelvis as above.  Check hepatitis panel.  Monitor.    Hypokalemia/ Hyponatremia POA: Due to IVVD and vomiting.  Replete as needed.  Monitor mag.    Thrombocytosis POA: Likely reactive.  Monitor.    Recent UTI POA: Check UA.  IV ceftriaxone x 3 days to complete treatment as she will not be able to tolerate oral antibiotics.    CKD stage III POA: Appears around baseline. Hold ACE for now due to n/v.  Monitor.     Type 2 diabetes POA: Hold home oral agents.  Insulin sliding scale.    Risk of deterioration: high      Total time spent with patient: 71 Minutes **I personally saw and examined the patient during this time period**                 Care Plan discussed with: Patient, Family, and Nursing Staff    Discussed:  Care Plan    Prophylaxis:  Lovenox    Probable Disposition:  Home w/Family           ___________________________________________________    Attending Physician: Horald Chestnut, DO

## 2023-03-16 NOTE — Care Coordination-Inpatient (Signed)
Initial Case Management Assessment    Discharge Plan:  Anticipate DC to home with no needs.  Family is able to assist with transportation.    Pt was re-admitted on 03/16/2023 for c/o abd pain, n/v. CM met with Pt to complete initial assessment. CM introduced self, CM role, and verified demographics.     Pt has no hx of HH, home O2, or equipment. Pt is independent with ADLs and ambulation. Denies problems with either.     Will continue to follow with DC needs.        03/16/23 1656   Service Assessment   Patient Orientation Alert and Oriented   History Provided By Patient   Primary Caregiver Self   Support Systems Family Members;Children   Patient's Healthcare Decision Maker is: Armed forces operational officer Next of Kin  (DTR (42yo)  Carmon Sails [phone])   PCP Verified by Salina April  ([place] APRN, [place] [phone])   Last Visit to PCP Within last 6 months   Prior Functional Level Independent in ADLs/IADLs   Current Functional Level Independent in ADLs/IADLs   Can patient return to prior living arrangement Yes   Ability to make needs known: Good   Family able to assist with home care needs: Yes   Would you like for me to discuss the discharge plan with any other family members/significant others, and if so, who? No   Financial Resources [place]  [place]   Community Resources None   CM/SW Referral Other (see comment)  (DC Planning)   Social/Functional History   Lives With Family   Type of Home Trailer   Home Layout One level   Home Access Stairs to enter with rails   Entrance Stairs - Number of Steps 5   Entrance Stairs - Rails Both   Bathroom Shower/Tub IT consultant None   Receives Help From Family   ADL Assistance Independent   Barrister's clerk Yes   Patient's Driver Info But daughter assists   Mode of  Engineer, water   Occupation Unemployed   Discharge Planning   Type of Residence House   Living Arrangements Children;Family Members   Current Services Prior To Admission None   Potential Assistance Needed N/A   DME Ordered? No   Potential Assistance Purchasing Medications No   Type of Home Care Services None   Patient expects to be discharged to: House   One/Two Story Residence One story   History of falls? 0   Services At/After Discharge   Transition of Care Consult (CM Consult) N/A   Services At/After Discharge None   [employer] Information Provided? No   Mode of Transport at Discharge Self   Confirm Follow Up Transport Family   Condition of Participation: Discharge Planning   The Plan for Transition of Care is related to the following treatment goals: c/o abdominal pain, n/v   Freedom of Choice list was provided with basic dialogue that supports the patient's individualized plan of care/goals, treatment preferences, and shares the quality data associated with the providers?  No       Readmission Assessment  Number of Days since last admission?: 8-30 days  Previous Disposition: Home with Family  Who is being Interviewed: Patient  What was the patient's/caregiver's perception as to why they think they needed to return back to  the hospital?: Other (Comment) (c/o abdominal pain, nausea, and vomiting)  Did you visit your Primary Care Physician after you left the hospital, before you returned this time?: No  Why weren't you able to visit your PCP?: Did not have an appointment, Other (Comment) (Patient stated that she was unable to make an appointment)  Did you see a specialist, such as Cardiac, Pulmonary, Orthopedic Physician, etc. after you left the hospital?: No  Who advised the patient to return to the hospital?: Self-referral, Caregiver  Does the patient report anything that got in the way of taking their medications?: No  In our efforts to provide the best possible care to you and others like you, can  you think of anything that we could have done to help you after you left the hospital the first time, so that you might not have needed to return so soon?: Teach back instructions regarding management of illness, Teaching during hospitalization regarding your illness, Discharge instructions that are concise, clear, and non contradictory, Identify patient's health literacy needs      ______________________________________  Ennis Forts, BSN, RN-CM  Letta Pate- Care Management  Available via [facility]  03/16/2023  4:59 PM

## 2023-03-16 NOTE — Progress Notes (Signed)
Patient arrived from the ED actively vomiting.  Pills were noted in the emesis.  Antiemetics were not administered prior to medication administration.

## 2023-03-16 NOTE — Care Coordination-Inpatient (Signed)
Advance Care Planning     General Advance Care Planning (ACP) Conversation    Date of Conversation: 03/16/2023  Conducted with: Patient with Decision Making Capacity    Healthcare Decision Maker:    Primary Decision Maker: Carmon Sails - Child [phone]  Click here to complete Healthcare Decision Makers including selection of the Healthcare Decision Maker Relationship (ie "Primary").     Today we documented Decision Maker(s) consistent with Legal Next of Kin hierarchy.    Content/Action Overview:  Has NO ACP documents-Information provided      Length of Voluntary ACP Conversation in minutes:  <16 minutes (Non-Billable)    ______________________________________  Ennis Forts, BSN, RN-CM  Letta Pate- Care Management  Available via Perfect[employer]  03/16/2023  5:01 PM

## 2023-03-16 NOTE — Consults (Signed)
Session ID: [id number]  Language: Spanish  Interpreter ID: [id number]  Interpreter Name: Jonetta Speak

## 2023-03-16 NOTE — ED Notes (Signed)
Telephone report to [employer].

## 2023-03-17 ENCOUNTER — Inpatient Hospital Stay: Admit: 2023-03-17 | Payer: MEDICAID | Primary: Adult Health

## 2023-03-17 LAB — COMPREHENSIVE METABOLIC PANEL W/ REFLEX TO MG FOR LOW K
ALT: 124 U/L — ABNORMAL HIGH (ref 12–78)
AST: 151 U/L — ABNORMAL HIGH (ref 15–37)
Albumin/Globulin Ratio: 0.6 — ABNORMAL LOW (ref 1.1–2.2)
Albumin: 2.2 g/dL — ABNORMAL LOW (ref 3.5–5.0)
Alk Phosphatase: 103 U/L (ref 45–117)
Anion Gap: 7 mmol/L (ref 5–15)
BUN/Creatinine Ratio: 9 — ABNORMAL LOW (ref 12–20)
BUN: 10 MG/DL (ref 6–20)
CO2: 24 mmol/L (ref 21–32)
Calcium: 8.2 MG/DL — ABNORMAL LOW (ref 8.5–10.1)
Chloride: 109 mmol/L — ABNORMAL HIGH (ref 97–108)
Creatinine: 1.16 MG/DL — ABNORMAL HIGH (ref 0.55–1.02)
Est, Glom Filt Rate: 61 mL/min/{1.73_m2} (ref >60)
Globulin: 3.4 g/dL (ref 2.0–4.0)
Glucose: 174 mg/dL — ABNORMAL HIGH (ref 65–100)
Potassium: 3.4 mmol/L — ABNORMAL LOW (ref 3.5–5.1)
Sodium: 140 mmol/L (ref 136–145)
Total Bilirubin: 0.2 MG/DL (ref 0.2–1.0)
Total Protein: 5.6 g/dL — ABNORMAL LOW (ref 6.4–8.2)

## 2023-03-17 LAB — CBC WITH AUTO DIFFERENTIAL
Basophils %: 1 % (ref 0–1)
Basophils Absolute: 0.1 10*3/uL (ref 0.0–0.1)
Eosinophils %: 2 % (ref 0–7)
Eosinophils Absolute: 0.1 10*3/uL (ref 0.0–0.4)
Hematocrit: 27.6 % — ABNORMAL LOW (ref 35.0–47.0)
Hemoglobin: 9.2 g/dL — ABNORMAL LOW (ref 11.5–16.0)
Immature Granulocytes %: 0 % (ref 0.0–0.5)
Immature Granulocytes Absolute: 0 10*3/uL (ref 0.00–0.04)
Lymphocytes %: 25 % (ref 12–49)
Lymphocytes Absolute: 1.9 10*3/uL (ref 0.8–3.5)
MCH: 27.1 PG (ref 26.0–34.0)
MCHC: 33.3 g/dL (ref 30.0–36.5)
MCV: 81.2 FL (ref 80.0–99.0)
MPV: 8.7 FL — ABNORMAL LOW (ref 8.9–12.9)
Monocytes %: 8 % (ref 5–13)
Monocytes Absolute: 0.6 10*3/uL (ref 0.0–1.0)
Neutrophils %: 65 % (ref 32–75)
Neutrophils Absolute: 5 10*3/uL (ref 1.8–8.0)
Nucleated RBCs: 0 PER 100 WBC
Platelets: 398 10*3/uL (ref 150–400)
RBC: 3.4 M/uL — ABNORMAL LOW (ref 3.80–5.20)
RDW: 19.1 % — ABNORMAL HIGH (ref 11.5–14.5)
WBC: 7.6 10*3/uL (ref 3.6–11.0)
nRBC: 0 10*3/uL (ref 0.00–0.01)

## 2023-03-17 LAB — URINALYSIS WITH REFLEX TO CULTURE
BACTERIA, URINE: NEGATIVE /hpf
Bilirubin, Urine: NEGATIVE
Glucose, Ur: 500 mg/dL — AB
Leukocyte Esterase, Urine: NEGATIVE
Nitrite, Urine: NEGATIVE
Protein, UA: >300 mg/dL — AB
Specific Gravity, UA: 1.017 (ref 1.003–1.030)
Urobilinogen, Urine: 0.2 EU/dL (ref 0.2–1.0)
pH, Urine: 5.5 (ref 5.0–8.0)

## 2023-03-17 LAB — EKG 12-LEAD
Atrial Rate: 112 {beats}/min
P Axis: 71 degrees
P-R Interval: 134 ms
Q-T Interval: 378 ms
QRS Duration: 86 ms
QTc Calculation (Bazett): 515 ms
R Axis: 267 degrees
T Axis: 57 degrees
Ventricular Rate: 112 {beats}/min

## 2023-03-17 LAB — POCT GLUCOSE
POC Glucose: 147 mg/dL — ABNORMAL HIGH (ref 65–117)
POC Glucose: 152 mg/dL — ABNORMAL HIGH (ref 65–117)
POC Glucose: 154 mg/dL — ABNORMAL HIGH (ref 65–117)
POC Glucose: 167 mg/dL — ABNORMAL HIGH (ref 65–117)

## 2023-03-17 LAB — EXTRA TUBES HOLD: Specimen HOld: 1

## 2023-03-17 LAB — MAGNESIUM: Magnesium: 2.3 mg/dL (ref 1.6–2.4)

## 2023-03-17 MED ORDER — POTASSIUM CHLORIDE 10 MEQ/100ML IV SOLN
10 | INTRAVENOUS | Status: AC
Start: 2023-03-17 — End: 2023-03-17
  Administered 2023-03-17 (×2): 10 meq via INTRAVENOUS

## 2023-03-17 MED ORDER — BISACODYL 10 MG RE SUPP
10 | Freq: Once | RECTAL | Status: AC
Start: 2023-03-17 — End: 2023-03-17
  Administered 2023-03-17: 16:00:00 10 mg via RECTAL

## 2023-03-17 MED ORDER — METOCLOPRAMIDE HCL 5 MG/ML IJ SOLN
5 | Freq: Four times a day (QID) | INTRAMUSCULAR | Status: DC
Start: 2023-03-17 — End: 2023-03-18
  Administered 2023-03-17 – 2023-03-18 (×3): 10 mg via INTRAVENOUS

## 2023-03-17 MED ORDER — POTASSIUM CHLORIDE ER 10 MEQ PO TBCR
10 | ORAL | Status: DC
Start: 2023-03-17 — End: 2023-03-17

## 2023-03-17 MED ORDER — CLONIDINE 0.2 MG/24HR TD PTWK
0.224 MG/24HR | TRANSDERMAL | Status: DC
Start: 2023-03-17 — End: 2023-03-23
  Administered 2023-03-17: 13:00:00 1 via TRANSDERMAL

## 2023-03-17 MED ORDER — MINERAL OIL RE ENEM
Freq: Once | RECTAL | Status: AC
Start: 2023-03-17 — End: 2023-03-17
  Administered 2023-03-17: 14:00:00 1 via RECTAL

## 2023-03-17 MED ORDER — POLYETHYLENE GLYCOL 3350 17 G PO PACK
17 | Freq: Two times a day (BID) | ORAL | Status: DC
Start: 2023-03-17 — End: 2023-03-17

## 2023-03-17 MED ORDER — HYDRALAZINE HCL 20 MG/ML IJ SOLN
20 MG/ML | Freq: Four times a day (QID) | INTRAMUSCULAR | Status: DC | PRN
Start: 2023-03-17 — End: 2023-03-23
  Administered 2023-03-17 – 2023-03-22 (×8): 10 mg via INTRAVENOUS

## 2023-03-17 MED ORDER — POLYETHYLENE GLYCOL 3350 17 G PO PACK
17 | Freq: Three times a day (TID) | ORAL | Status: DC
Start: 2023-03-17 — End: 2023-03-18
  Administered 2023-03-17 – 2023-03-18 (×3): 17 g via ORAL

## 2023-03-17 MED ORDER — PROCHLORPERAZINE EDISYLATE 10 MG/2ML IJ SOLN
102 MG/2ML | Freq: Four times a day (QID) | INTRAMUSCULAR | Status: DC | PRN
Start: 2023-03-17 — End: 2023-03-23
  Administered 2023-03-17 – 2023-03-22 (×13): 10 mg via INTRAVENOUS

## 2023-03-17 MED ORDER — METOPROLOL TARTRATE 5 MG/5ML IV SOLN
55 MG/ML | Freq: Three times a day (TID) | INTRAVENOUS | Status: DC
Start: 2023-03-17 — End: 2023-03-21
  Administered 2023-03-17 – 2023-03-21 (×11): 5 mg via INTRAVENOUS

## 2023-03-17 MED ORDER — HYDROMORPHONE 0.5MG/0.5ML IJ SOLN
1 | Status: DC | PRN
Start: 2023-03-17 — End: 2023-03-18
  Administered 2023-03-17 – 2023-03-18 (×6): 0.5 mg via INTRAVENOUS

## 2023-03-17 MED ORDER — DICYCLOMINE HCL 10 MG PO CAPS
10 | Freq: Three times a day (TID) | ORAL | Status: DC
Start: 2023-03-17 — End: 2023-03-17

## 2023-03-17 MED FILL — PROCHLORPERAZINE EDISYLATE 10 MG/2ML IJ SOLN: 10 MG/2ML | INTRAMUSCULAR | Qty: 2

## 2023-03-17 MED FILL — HYDROMORPHONE HCL 1 MG/ML IJ SOLN: 1 MG/ML | INTRAMUSCULAR | Qty: 0.5

## 2023-03-17 MED FILL — PANTOPRAZOLE SODIUM 40 MG IV SOLR: 40 MG | INTRAVENOUS | Qty: 40

## 2023-03-17 MED FILL — ENOXAPARIN SODIUM 40 MG/0.4ML IJ SOSY: 40 MG/0.4ML | INTRAMUSCULAR | Qty: 0.4

## 2023-03-17 MED FILL — METOCLOPRAMIDE HCL 5 MG/ML IJ SOLN: 5 MG/ML | INTRAMUSCULAR | Qty: 2

## 2023-03-17 MED FILL — FLEET OIL RE ENEM: RECTAL | Qty: 1

## 2023-03-17 MED FILL — CLONIDINE 0.2 MG/24HR TD PTWK: 0.2 MG/24HR | TRANSDERMAL | Qty: 1

## 2023-03-17 MED FILL — METOPROLOL TARTRATE 5 MG/5ML IV SOLN: 5 MG/ML | INTRAVENOUS | Qty: 5

## 2023-03-17 MED FILL — DULCOLAX 10 MG RE SUPP: 10 MG | RECTAL | Qty: 1

## 2023-03-17 MED FILL — ONDANSETRON HCL 4 MG/2ML IJ SOLN: 4 MG/2ML | INTRAMUSCULAR | Qty: 2

## 2023-03-17 MED FILL — HYDRALAZINE HCL 20 MG/ML IJ SOLN: 20 MG/ML | INTRAMUSCULAR | Qty: 1

## 2023-03-17 MED FILL — CEFTRIAXONE SODIUM 1 G IJ SOLR: 1 g | INTRAMUSCULAR | Qty: 1000

## 2023-03-17 MED FILL — POLYETHYLENE GLYCOL 3350 17 G PO PACK: 17 g | ORAL | Qty: 1

## 2023-03-17 MED FILL — POTASSIUM CHLORIDE 10 MEQ/100ML IV SOLN: 10 MEQ/0ML | INTRAVENOUS | Qty: 100

## 2023-03-17 NOTE — Progress Notes (Signed)
Physical Therapy Note:  Consult received and chart reviewed. The patient is admitted for nausea and abdominal pain but is 42 y.o. and typically independent. Patient discussion with CM indicates patient independence with mobility and self-care with no patient verbalized concerns. Spoke with nursing who confirms independence with mobility. PT evaluation not indicated and will sign off. Please re-consult if there is change in status and/or additional concerns arise.  Thank you,  Cathie Olden, PT

## 2023-03-17 NOTE — Progress Notes (Signed)
Eucharistic ministry visit.  Melinda [name] was in bed.  Prayer and  spiritual communion offered due to medical restrictions. Assured of continued prayer/    Chaplain Sr. Verdene Lennert, SBS, RN, ACSW, LCSW  Chaplain Page:  [phone])

## 2023-03-17 NOTE — Progress Notes (Signed)
Spiritual Care Assessment/Progress Note  [facility]    Name: Melinda [name] MRN: [id number]    Age: 42 y.o.     Sex: female   Language: Spanish     Date: 03/17/2023            Total Time Calculated: 23 min              Spiritual Assessment begun in [facility] B3 INTERMEDIATE CARE UNIT  Service Provided For: Patient and family together     Encounter Overview/Reason: Initial Encounter    Spiritual beliefs:      [x]  Involved in a faith tradition/spiritual practice: Catholic      []  Supported by a faith community:      []  Claims no spiritual orientation:      []  Seeking spiritual identity:           []  Adheres to an individual form of spirituality:      []  Not able to assess:                Identified resources for coping and support system:   Support System: Spouse, Children       [x]  Prayer                  []  Devotional reading               []  Music                  []  Guided Imagery     []  Pet visits                                        []  Other: (COMMENT)     Specific area/focus of visit   Encounter:    Crisis:    Spiritual/Emotional needs: Type: Spiritual Support  Ritual, Rites and Sacraments:    Grief, Loss, and Adjustments:    Ethics/Mediation:    Behavioral Health:    Palliative Care:    Advance Care Planning:           Narrative:   Spiritual care assessment with Melinda [name] on [place].  Reviewed chart and met Pt bedside. As stated in previous Chaplain notes, Pt has 3 daughters, and stated she is Catholic. She stated that she is not feeling good at all. She is sitting up in bed holding vomit bag and has her youngest daughter present. Daughter shared that she has 2 older sisters, 83 and 48 y.o. Pt was having dry heaves and not in a good place to hold conversation at this time. Chaplain stated availability and how to view the Catholic Mass at 12 noon on channel 86. They expressed their appreciation for the caring visit.  Please contact Spiritual Care for any emotional and spiritual needs  and/or referrals. Thank you.    Chaplain Hale Bogus, M.Div., [facility].  [facility] Spiritual Health Team [phone])

## 2023-03-17 NOTE — Progress Notes (Signed)
Denys Salinger, PA-C                       [phone] office             Monday-Friday 8:00 am-4:30 pm  I am not permitted to use "perfect serve" use above for contact, thanks.        Gastroenterology Progress Note    March 17, 2023  Admit Date: 03/16/2023         Narrative Assessment and Plan   42 year old female with past medical history of type 2 diabetes on insulin, hypertension, hypercholesterolemia, admitted 03/16/2023 with abdominal pain, nausea, vomiting.       Suspected gastroparesis on Reglan, which she takes once daily at home.       Recent H pylori infection diagnosed on EGD pathology, treated with bismuth, Flagyl, doxycycline therapy prescribed 02/14/23. She reports she has been taking this 4x daily as prescribed and had two days left.      CT abdomen pelvis from 02/17/2023 does demonstrate some fecal stasis and increased stool burden.  Hemoglobin 11.8, WBC 9.0, sodium 135, potassium 3.3, creatinine 1.19.     Impression:  Abdominal pain, nausea, and vomiting      Plan:  Start with management of constipation evidenced by large stool burden on CT 02/17/23.  I will add mineral oil enema followed by dulcolax suppository today followed by Miralax TID. Instructed her to take it BID at home and explained that her N/V is likely largely related to constipation.   Give Reglan before meals and at bedtime while in the hospital, if this helps she should take it on this schedule at home.   Monitor LFTs, follow up as outpatient.     [name], PA-C    Subjective:   Chief Complaint: Abdominal Pain, Nausea, and Vomiting     History of Present Illness: Melinda [name] is a 42 year old female with past medical history of type 2 diabetes (on Novolin 70-30 insulin 15 units twice daily, last A1c 8.5 on 02/10/23), hypertension, and hypercholesterolemia, admitted 03/16/23 with abdominal pain, nausea, and vomiting.  She takes Reglan once  per day at home, and reports that she is compliant with eating small, mainly pured meals.  She reports her symptoms get better for several days at a time, then worse again.  She has a bowel movement every other day to every couple of days at home.      LFTs elevated this admission. No new medications, no alcohol or drug use. S/p cholecystectomy.        03/17/23 : Interpreter Alonna Minium [phone].Patient with some improvement in N/V overnight.       Several recent admissions for the same:      07/08/21 admitted at [facility] with abd pain, nausea, vomiting. Assessed by GI with concern for gastroparesis, diet modifications recommended.      01/14/23-01/20/23 admitted at [facility] with abd pain, nausea and vomiting and melena. EGD 01/17/23 with retained food in stomach, repeat EGD 01/18/23 unremarkable. Colonoscopy 01/19/23 with small internal hemorrhoids, Pillcam administered 01/19/23.      02/01/23-02/04/23 admitted at [facility] with abd pain, nausea, and vomiting. Abnormal HIDA scan, patient underwent cholecystectomy.      02/10/23-02/18/23 admitted at [facility] with abdominal pain. Fluid in gallbladder fossa drained by IR, culture negative.  Started on bismuth, Flagyl, doxycycline, PPI x 14 days on discharge.     02/19/23-02/20/23 admitted at [facility] for the  same.  No clear etiology found at that time.     03/11/2023 to 03/14/2023 admitted to [facility] for epigastric abdominal pain, nausea, and vomiting.  Met sepsis criteria for UTI this admission, treated with IV Rocephin.  Gastric emptying study 03/14/2023 normal.  CT repeated this admission, reported as negative.          ROS:  The previous review of systems on initial consultation / H&P is noted and reviewed.  Specific changes noted above in HPI.    Current Medications:     Current Facility-Administered Medications   Medication Dose Route Frequency    metoprolol (LOPRESSOR) injection 5 mg  5 mg IntraVENous Q8H    prochlorperazine (COMPAZINE)  injection 10 mg  10 mg IntraVENous Q6H PRN    hydrALAZINE (APRESOLINE) injection 10 mg  10 mg IntraVENous Q6H PRN    cloNIDine (CATAPRES) 0.2 MG/24HR 1 patch  1 patch TransDERmal Weekly    potassium chloride 10 mEq/100 mL IVPB (Peripheral Line)  10 mEq IntraVENous Q1H    mineral oil enema 1 enema  1 enema Rectal Once    Followed by    bisacodyl (DULCOLAX) suppository 10 mg  10 mg Rectal Once    polyethylene glycol (GLYCOLAX) packet 17 g  17 g Oral BID    pantoprazole (PROTONIX) 40 mg in sodium chloride (PF) 0.9 % 10 mL injection  40 mg IntraVENous Daily    sodium chloride flush 0.9 % injection 5-40 mL  5-40 mL IntraVENous 2 times per day    sodium chloride flush 0.9 % injection 5-40 mL  5-40 mL IntraVENous PRN    0.9 % sodium chloride infusion   IntraVENous PRN    potassium chloride (KLOR-CON) extended release tablet 40 mEq  40 mEq Oral PRN    Or    potassium bicarb-citric acid (EFFER-K) effervescent tablet 40 mEq  40 mEq Oral PRN    Or    potassium chloride 10 mEq/100 mL IVPB (Peripheral Line)  10 mEq IntraVENous PRN    magnesium sulfate 2000 mg in 50 mL IVPB premix  2,000 mg IntraVENous PRN    enoxaparin (LOVENOX) injection 40 mg  40 mg SubCUTAneous Daily    ondansetron (ZOFRAN-ODT) disintegrating tablet 4 mg  4 mg Oral Q8H PRN    Or    ondansetron (ZOFRAN) injection 4 mg  4 mg IntraVENous Q6H PRN    polyethylene glycol (GLYCOLAX) packet 17 g  17 g Oral Daily PRN    acetaminophen (TYLENOL) tablet 650 mg  650 mg Oral Q6H PRN    Or    acetaminophen (TYLENOL) suppository 650 mg  650 mg Rectal Q6H PRN    metoclopramide (REGLAN) injection 10 mg  10 mg IntraVENous TID    0.9 % sodium chloride infusion   IntraVENous Continuous    HYDROmorphone (DILAUDID) injection 0.5 mg  0.5 mg IntraVENous Q6H PRN    insulin lispro (HUMALOG,ADMELOG) injection vial 0-4 Units  0-4 Units SubCUTAneous TID WC    insulin lispro (HUMALOG,ADMELOG) injection vial 0-4 Units  0-4 Units SubCUTAneous Nightly    cefTRIAXone (ROCEPHIN) 1,000 mg in  sterile water 10 mL IV syringe  1,000 mg IntraVENous Q24H    amLODIPine (NORVASC) tablet 10 mg  10 mg Oral Daily    [Held by provider] metoprolol succinate (TOPROL XL) extended release tablet 50 mg  50 mg Oral Daily    [Held by provider] lisinopril (PRINIVIL;ZESTRIL) tablet 40 mg  40 mg Oral Daily  Objective:     VITALS:   Last 24hrs VS reviewed since prior progress note. Most recent are:  Vitals:    03/17/23 0900   BP: (!) 171/96   Pulse: 97   Resp: 11   Temp:    SpO2:      Temp (24hrs), Avg:97.8 F (36.6 C), Min:97.4 F (36.3 C), Max:98.3 F (36.8 C)      Intake/Output Summary (Last 24 hours) at 03/17/2023 0940  Last data filed at 03/17/2023 1610  Gross per 24 hour   Intake 0 ml   Output 875 ml   Net -875 ml       EXAM:  General:  Comfortable, no distress    HEENT:  Atraumatic skull, pupils equal  Lungs:   No cough or increased work of breathing.   Speaking in complete sentences  Musc:   No skeletal defects or deformities  Neurologic:   Cranial nerves grossly intact, moves all 4 extremities  Psych:    Good insight. Not anxious nor agitated  Derm:   No rash, jaundice, nor palpable dermatologic mass    Lab Data Reviewed:   Recent Labs     03/16/23  0816 03/17/23  0036   WBC 9.0 7.6   HGB 11.8 9.2*   HCT 34.6* 27.6*   PLT 506* 398     Recent Labs     03/16/23  0816 03/17/23  0036   NA 135* 140   K 3.3* 3.4*   CL 104 109*   CO2 24 24   BUN 11 10   MG 1.8 2.3   ALT 94* 124*     No results found for: "GLUCPOC"  No results for input(s): "PH", "PCO2", "PO2", "HCO3", "FIO2" in the last 72 hours.  No results for input(s): "INR" in the last 72 hours.        Assessment:   (See above)  Principal Problem:    Intractable nausea and vomiting  Resolved Problems:    * No resolved hospital problems. *      Plan:   (See above)      Signed By: [name], PA-C     03/17/2023  9:40 AM

## 2023-03-17 NOTE — Progress Notes (Signed)
1930 Bedside shift change report given to Ivor Messier RN (oncoming nurse) by Silva Bandy RN  (offgoing nurse). Report included the following information SBAR, Intake/Output, MAR, Recent Results, and Cardiac Rhythm Sinus Tach .      2220 Patient complaining of 10/10 pain. Notified NP Kyra Leyland. Orders received to give prn dilaudid early.     1610 Unable to obtain patient labs overnight, will notify dayshift RN     0700 Bedside shift change report given to Silva Bandy RN (oncoming nurse) by Nechama Guard (offgoing nurse). Report included the following information SBAR, Intake/Output, MAR, Recent Results, and Cardiac Rhythm Sinus tach .

## 2023-03-17 NOTE — Care Coordination-Inpatient (Signed)
Case Management Progress Note    Discharge Plan:  Anticipate DC to home with no needs.  Family is able to assist with transportation.     Pt was re-admitted on 03/16/2023 for c/o abd pain, n/v.      Clinical Updates:  (1)  GI following.  Start management of constipation. Some improvement with n/v.     Will continue to follow.    ______________________________________  Ennis Forts, BSN, RN-CM  Letta Pate- Care Management  Available via PerfectServe  03/17/2023  11:56 AM

## 2023-03-17 NOTE — Progress Notes (Signed)
[facility]  [address] [place], [place] [address]  [phone]      Medical Progress Note      NAME: Melinda [name]   DOB:  August 04, 1981  MRM:  [id number]    Date/Time of service: 03/17/2023        Subjective:     Chief Complaint:  Patient was personally seen and examined by me during this time period.  Chart reviewed. Fu abdominal pain. Continues to report abd pain and n/v. Family at bedside        Objective:       Vitals:       Last 24hrs VS reviewed since prior progress note. Most recent are:    Vitals:    03/17/23 1155   BP: (!) 177/96   Pulse: 98   Resp: 15   Temp: 98.5 F (36.9 C)   SpO2: 98%     SpO2 Readings from Last 6 Encounters:   03/17/23 98%   02/17/23 95%   02/14/23 98%   02/04/23 97%   01/20/23 98%   06/29/22 100%          Intake/Output Summary (Last 24 hours) at 03/17/2023 1407  Last data filed at 03/17/2023 2130  Gross per 24 hour   Intake 0 ml   Output 875 ml   Net -875 ml        Exam:     Physical Exam:      Gen:  Well-developed, well-nourished, in no acute distress  HEENT:  Pink conjunctivae, PERRL, hearing intact to voice  Resp:  No accessory muscle use, clear breath sounds without wheezes rales or rhonchi  Card:  tachycardia, No murmurs, normal S1, S2, no peripheral edema  Abd:  abd TTP, non-distended, normoactive bowel sounds are present  Musc:  No cyanosis or clubbing  Skin:  No rashes or ulcers, skin turgor is good  Neuro:  Cranial nerves 3-12 are grossly intact, follows commands appropriately  Psych:  Oriented to person, place, and time, Alert with good insight      Medications Reviewed: (see below)    Lab Data Reviewed: (see below)    ______________________________________________________________________    Medications:     Current Facility-Administered Medications   Medication Dose Route Frequency    metoprolol (LOPRESSOR) injection 5 mg  5 mg IntraVENous Q8H    prochlorperazine (COMPAZINE) injection 10 mg  10 mg IntraVENous Q6H PRN    hydrALAZINE  (APRESOLINE) injection 10 mg  10 mg IntraVENous Q6H PRN    cloNIDine (CATAPRES) 0.2 MG/24HR 1 patch  1 patch TransDERmal Weekly    polyethylene glycol (GLYCOLAX) packet 17 g  17 g Oral TID    metoclopramide (REGLAN) injection 10 mg  10 mg IntraVENous 4x Daily AC & HS    HYDROmorphone (DILAUDID) injection 0.5 mg  0.5 mg IntraVENous Q4H PRN    pantoprazole (PROTONIX) 40 mg in sodium chloride (PF) 0.9 % 10 mL injection  40 mg IntraVENous Daily    sodium chloride flush 0.9 % injection 5-40 mL  5-40 mL IntraVENous 2 times per day    sodium chloride flush 0.9 % injection 5-40 mL  5-40 mL IntraVENous PRN    0.9 % sodium chloride infusion   IntraVENous PRN    potassium chloride (KLOR-CON) extended release tablet 40 mEq  40 mEq Oral PRN    Or    potassium bicarb-citric acid (EFFER-K) effervescent tablet 40 mEq  40 mEq Oral PRN  Or    potassium chloride 10 mEq/100 mL IVPB (Peripheral Line)  10 mEq IntraVENous PRN    magnesium sulfate 2000 mg in 50 mL IVPB premix  2,000 mg IntraVENous PRN    enoxaparin (LOVENOX) injection 40 mg  40 mg SubCUTAneous Daily    ondansetron (ZOFRAN-ODT) disintegrating tablet 4 mg  4 mg Oral Q8H PRN    Or    ondansetron (ZOFRAN) injection 4 mg  4 mg IntraVENous Q6H PRN    polyethylene glycol (GLYCOLAX) packet 17 g  17 g Oral Daily PRN    acetaminophen (TYLENOL) tablet 650 mg  650 mg Oral Q6H PRN    Or    acetaminophen (TYLENOL) suppository 650 mg  650 mg Rectal Q6H PRN    0.9 % sodium chloride infusion   IntraVENous Continuous    insulin lispro (HUMALOG,ADMELOG) injection vial 0-4 Units  0-4 Units SubCUTAneous TID WC    insulin lispro (HUMALOG,ADMELOG) injection vial 0-4 Units  0-4 Units SubCUTAneous Nightly    cefTRIAXone (ROCEPHIN) 1,000 mg in sterile water 10 mL IV syringe  1,000 mg IntraVENous Q24H    amLODIPine (NORVASC) tablet 10 mg  10 mg Oral Daily    [Held by provider] metoprolol succinate (TOPROL XL) extended release tablet 50 mg  50 mg Oral Daily    [Held by provider] lisinopril  (PRINIVIL;ZESTRIL) tablet 40 mg  40 mg Oral Daily          Lab Review:     Recent Labs     03/16/23  0816 03/17/23  0036   WBC 9.0 7.6   HGB 11.8 9.2*   HCT 34.6* 27.6*   PLT 506* 398     Recent Labs     03/16/23  0816 03/17/23  0036   NA 135* 140   K 3.3* 3.4*   CL 104 109*   CO2 24 24   BUN 11 10   MG 1.8 2.3   ALT 94* 124*     No results found for: "GLUCPOC"       Assessment / Plan:     Acute abdominal pain/intractable nausea vomiting POA: possibly due to constipation and /or gastroparesis.  Repeat CT abdomen pelvis.  Recent gastric emptying study negative; but patient was on Reglan at that time which may have affected results.  Check stool cultures if any diarrhea.  Lipase within normal limits. Bowel regimen per GI.  IVF.  IV PPI. Sch IV Reglan. IV antiemetics as needed. Iv dilaudid prn. Consult GI.     Hypertensive urgency/history of hypertension POA: Suspect has been noncompliant with medications due to nausea vomiting.  Amlodipine and as tolerated; hold ACE due to n/v. Change coreg to IV lopressor for now. Added clonidine patch. Iv hydralazine prn      Elevated LFTs/ Alk phos POA: Check CT abdomen pelvis as above.  Neg hepatitis panel.  Monitor.     Hypokalemia/ Hyponatremia POA: Due to IVVD and vomiting.  Replete as needed.  Monitor mag.     Thrombocytosis POA: Likely reactive.  Monitor.     Recent UTI POA: UA no eo of infection.  IV ceftriaxone x 3 days to complete treatment as she will not be able to tolerate oral antibiotics OP due to n/v.     CKD stage III POA: Appears around baseline. Hold ACE for now due to n/v.  Monitor.     Type 2 diabetes POA: Hold home oral agents.  Insulin sliding scale.    Total time spent with  patient: 105 Minutes **I personally saw and examined the patient during this time period**                 Care Plan discussed with: Patient and Nursing Staff    Discussed:  Care Plan    Prophylaxis:  Lovenox    Disposition:  Home  w/Family           ___________________________________________________    Attending Physician: Horald Chestnut, DO

## 2023-03-18 LAB — HEPATIC FUNCTION PANEL
ALT: 141 U/L — ABNORMAL HIGH (ref 12–78)
AST: 96 U/L — ABNORMAL HIGH (ref 15–37)
Albumin/Globulin Ratio: 0.8 — ABNORMAL LOW (ref 1.1–2.2)
Albumin: 2.5 g/dL — ABNORMAL LOW (ref 3.5–5.0)
Alk Phosphatase: 122 U/L — ABNORMAL HIGH (ref 45–117)
Bilirubin, Direct: 0.1 MG/DL (ref 0.0–0.2)
Globulin: 3.3 g/dL (ref 2.0–4.0)
Total Bilirubin: 0.2 MG/DL (ref 0.2–1.0)
Total Protein: 5.8 g/dL — ABNORMAL LOW (ref 6.4–8.2)

## 2023-03-18 LAB — POCT GLUCOSE
POC Glucose: 164 mg/dL — ABNORMAL HIGH (ref 65–117)
POC Glucose: 226 mg/dL — ABNORMAL HIGH (ref 65–117)
POC Glucose: 151 mg/dL — ABNORMAL HIGH (ref 65–117)
POC Glucose: 148 mg/dL — ABNORMAL HIGH (ref 65–117)

## 2023-03-18 LAB — LACTIC ACID: Lactic Acid, Plasma: 0.8 MMOL/L (ref 0.4–2.0)

## 2023-03-18 LAB — PROCALCITONIN: Procalcitonin: <0.05 ng/mL

## 2023-03-18 MED ORDER — DEXTROSE 10 % IV BOLUS
INTRAVENOUS | Status: DC | PRN
Start: 2023-03-18 — End: 2023-03-23

## 2023-03-18 MED ORDER — DEXTROSE 10 % IV SOLN
10 % | INTRAVENOUS | Status: DC | PRN
Start: 2023-03-18 — End: 2023-03-23

## 2023-03-18 MED ORDER — DOXYCYCLINE HYCLATE 100 MG PO TABS
100 MG | Freq: Two times a day (BID) | ORAL | Status: DC
Start: 2023-03-18 — End: 2023-03-19
  Administered 2023-03-18 – 2023-03-19 (×2): 100 mg via ORAL

## 2023-03-18 MED ORDER — GLUCAGON (RDNA) 1 MG IJ KIT
1 MG | INTRAMUSCULAR | Status: DC | PRN
Start: 2023-03-18 — End: 2023-03-23

## 2023-03-18 MED ORDER — METOCLOPRAMIDE HCL 5 MG/ML IJ SOLN
5 | Freq: Four times a day (QID) | INTRAMUSCULAR | Status: DC
Start: 2023-03-18 — End: 2023-03-18
  Administered 2023-03-18: 15:00:00 5 mg via INTRAVENOUS

## 2023-03-18 MED ORDER — METRONIDAZOLE 250 MG PO TABS
250 MG | Freq: Three times a day (TID) | ORAL | Status: DC
Start: 2023-03-18 — End: 2023-03-21
  Administered 2023-03-18 – 2023-03-21 (×9): 500 mg via ORAL

## 2023-03-18 MED ORDER — BISMUTH SUBSALICYLATE 262 MG/15ML PO SUSP
262 MG/15ML | Freq: Four times a day (QID) | ORAL | Status: DC
Start: 2023-03-18 — End: 2023-03-19
  Administered 2023-03-18 – 2023-03-19 (×3): 15 mL via ORAL

## 2023-03-18 MED ORDER — GLUCOSE 4 G PO CHEW
4 g | ORAL | Status: DC | PRN
Start: 2023-03-18 — End: 2023-03-23

## 2023-03-18 MED ORDER — SUCRALFATE 1 G PO TABS
1 GM | Freq: Four times a day (QID) | ORAL | Status: DC
Start: 2023-03-18 — End: 2023-03-23
  Administered 2023-03-18 – 2023-03-23 (×20): 1 g via ORAL

## 2023-03-18 MED ORDER — SODIUM CHLORIDE (PF) 0.9 % IJ SOLN
0.9 | Freq: Two times a day (BID) | INTRAMUSCULAR | Status: DC
Start: 2023-03-18 — End: 2023-03-19
  Administered 2023-03-19: 01:00:00 40 mg via INTRAVENOUS

## 2023-03-18 MED ORDER — CEFTRIAXONE SODIUM 1 G IJ SOLR
1 g | INTRAMUSCULAR | Status: AC
Start: 2023-03-18 — End: 2023-03-21
  Administered 2023-03-19 – 2023-03-20 (×2): 1000 mg via INTRAVENOUS

## 2023-03-18 MED ORDER — PEG 3350-KCL-NABCB-NACL-NASULF 236 G PO SOLR
236 | Freq: Every day | ORAL | Status: AC
Start: 2023-03-18 — End: 2023-03-20
  Administered 2023-03-18: 19:00:00 2000 mL via ORAL

## 2023-03-18 MED ORDER — HYDROMORPHONE HCL PF 1 MG/ML IJ SOLN
1 MG/ML | INTRAMUSCULAR | Status: DC | PRN
Start: 2023-03-18 — End: 2023-03-20
  Administered 2023-03-18 – 2023-03-20 (×11): 1 mg via INTRAVENOUS

## 2023-03-18 MED FILL — DEXTROSE 10 % IV SOLN: 10 % | INTRAVENOUS | Qty: 500

## 2023-03-18 MED FILL — METOPROLOL TARTRATE 5 MG/5ML IV SOLN: 5 MG/ML | INTRAVENOUS | Qty: 5

## 2023-03-18 MED FILL — HYDROMORPHONE HCL 1 MG/ML IJ SOLN: 1 MG/ML | INTRAMUSCULAR | Qty: 0.5

## 2023-03-18 MED FILL — METOCLOPRAMIDE HCL 5 MG/ML IJ SOLN: 5 MG/ML | INTRAMUSCULAR | Qty: 2

## 2023-03-18 MED FILL — SUCRALFATE 1 G PO TABS: 1 GM | ORAL | Qty: 1

## 2023-03-18 MED FILL — CEFTRIAXONE SODIUM 1 G IJ SOLR: 1 g | INTRAMUSCULAR | Qty: 1000

## 2023-03-18 MED FILL — BISMUTH SUBSALICYLATE 262 MG/15ML PO SUSP: 262 MG/15ML | ORAL | Qty: 15

## 2023-03-18 MED FILL — ONDANSETRON HCL 4 MG/2ML IJ SOLN: 4 MG/2ML | INTRAMUSCULAR | Qty: 2

## 2023-03-18 MED FILL — POLYETHYLENE GLYCOL 3350 17 G PO PACK: 17 g | ORAL | Qty: 1

## 2023-03-18 MED FILL — GOLYTELY 236 G PO SOLR: 236 g | ORAL | Qty: 4000

## 2023-03-18 MED FILL — HYDRALAZINE HCL 20 MG/ML IJ SOLN: 20 MG/ML | INTRAMUSCULAR | Qty: 1

## 2023-03-18 MED FILL — ENOXAPARIN SODIUM 40 MG/0.4ML IJ SOSY: 40 MG/0.4ML | INTRAMUSCULAR | Qty: 0.4

## 2023-03-18 MED FILL — HYDROMORPHONE HCL 1 MG/ML IJ SOLN: 1 MG/ML | INTRAMUSCULAR | Qty: 1

## 2023-03-18 MED FILL — PROCHLORPERAZINE EDISYLATE 10 MG/2ML IJ SOLN: 10 MG/2ML | INTRAMUSCULAR | Qty: 2

## 2023-03-18 MED FILL — PANTOPRAZOLE SODIUM 40 MG IV SOLR: 40 MG | INTRAVENOUS | Qty: 40

## 2023-03-18 MED FILL — INSULIN LISPRO 100 UNIT/ML IJ SOLN: 100 UNIT/ML | INTRAMUSCULAR | Qty: 1

## 2023-03-18 MED FILL — AMLODIPINE BESYLATE 5 MG PO TABS: 5 MG | ORAL | Qty: 2

## 2023-03-18 MED FILL — DOXYCYCLINE HYCLATE 100 MG PO TABS: 100 MG | ORAL | Qty: 1

## 2023-03-18 MED FILL — METRONIDAZOLE 250 MG PO TABS: 250 MG | ORAL | Qty: 2

## 2023-03-18 NOTE — Progress Notes (Signed)
Pharmacy Dosing Services: 03/18/23    Pharmacist Renal Dosing  Note for Reglan   Physician Dr. Genice Rouge    Indication:   Previous Regimen 10mg  QID   Serum Creatinine Lab Results   Component Value Date/Time    CREATININE 1.16 03/17/2023 12:36 AM      Creatinine Clearance Estimated Creatinine Clearance: 55 mL/min (A) (based on SCr of 1.16 mg/dL (H)).   BUN Lab Results   Component Value Date/Time    BUN 10 03/17/2023 12:36 AM         Plan:  Adjust to 5mg  QID    Windell Hummingbird, PharmD, BCPS

## 2023-03-18 NOTE — Progress Notes (Signed)
[facility]  [address] [place], [place] [address]  [phone]      Medical Progress Note      NAME: Melinda [name]   DOB:  1981-09-16  MRM:  [id number]    Date/Time of service: 03/18/2023        Subjective:     Chief Complaint:  Patient was personally seen and examined by me during this time period.  Chart reviewed. Fu abdominal pain. Continues to report abd pain and n/v. Family at bedside        Objective:       Vitals:       Last 24hrs VS reviewed since prior progress note. Most recent are:    Vitals:    03/18/23 1400   BP: 136/74   Pulse: 95   Resp: 10   Temp:    SpO2: 99%     SpO2 Readings from Last 6 Encounters:   03/18/23 99%   02/17/23 95%   02/14/23 98%   02/04/23 97%   01/20/23 98%   06/29/22 100%        No intake or output data in the 24 hours ending 03/18/23 1458       Exam:     Physical Exam:      Gen:  Well-developed, well-nourished, in no acute distress  HEENT:  Pink conjunctivae, PERRL, hearing intact to voice  Resp:  No accessory muscle use, clear breath sounds without wheezes rales or rhonchi  Card:  tachycardia, No murmurs, normal S1, S2, no peripheral edema  Abd:  abd TTP, non-distended, normoactive bowel sounds are present  Musc:  No cyanosis or clubbing  Skin:  No rashes or ulcers, skin turgor is good  Neuro:  Cranial nerves 3-12 are grossly intact, follows commands appropriately  Psych:  Oriented to person, place, and time, Alert with good insight      Medications Reviewed: (see below)    Lab Data Reviewed: (see below)    ______________________________________________________________________    Medications:     Current Facility-Administered Medications   Medication Dose Route Frequency    pantoprazole (PROTONIX) 40 mg in sodium chloride (PF) 0.9 % 10 mL injection  40 mg IntraVENous Q12H    bismuth subsalicylate (PEPTO BISMOL) 262 MG/15ML suspension 15 mL  15 mL Oral 4x Daily    metroNIDAZOLE (FLAGYL) tablet 500 mg  500 mg Oral 3 times per day     doxycycline hyclate (VIBRA-TABS) tablet 100 mg  100 mg Oral 2 times per day    polyethylene glycol (GoLYTELY) solution 2,000 mL  2,000 mL Oral Daily    cefTRIAXone (ROCEPHIN) 1,000 mg in sterile water 10 mL IV syringe  1,000 mg IntraVENous Q24H    HYDROmorphone HCl PF (DILAUDID) injection 1 mg  1 mg IntraVENous Q4H PRN    sucralfate (CARAFATE) tablet 1 g  1 g Oral 4 times per day    glucose chewable tablet 16 g  4 tablet Oral PRN    dextrose bolus 10% 125 mL  125 mL IntraVENous PRN    Or    dextrose bolus 10% 250 mL  250 mL IntraVENous PRN    glucagon injection 1 mg  1 mg SubCUTAneous PRN    dextrose 10 % infusion   IntraVENous Continuous PRN    metoprolol (LOPRESSOR) injection 5 mg  5 mg IntraVENous Q8H    prochlorperazine (COMPAZINE) injection 10 mg  10 mg IntraVENous Q6H PRN    hydrALAZINE (APRESOLINE) injection  10 mg  10 mg IntraVENous Q6H PRN    cloNIDine (CATAPRES) 0.2 MG/24HR 1 patch  1 patch TransDERmal Weekly    sodium chloride flush 0.9 % injection 5-40 mL  5-40 mL IntraVENous 2 times per day    sodium chloride flush 0.9 % injection 5-40 mL  5-40 mL IntraVENous PRN    0.9 % sodium chloride infusion   IntraVENous PRN    potassium chloride (KLOR-CON) extended release tablet 40 mEq  40 mEq Oral PRN    Or    potassium bicarb-citric acid (EFFER-K) effervescent tablet 40 mEq  40 mEq Oral PRN    Or    potassium chloride 10 mEq/100 mL IVPB (Peripheral Line)  10 mEq IntraVENous PRN    magnesium sulfate 2000 mg in 50 mL IVPB premix  2,000 mg IntraVENous PRN    enoxaparin (LOVENOX) injection 40 mg  40 mg SubCUTAneous Daily    ondansetron (ZOFRAN-ODT) disintegrating tablet 4 mg  4 mg Oral Q8H PRN    Or    ondansetron (ZOFRAN) injection 4 mg  4 mg IntraVENous Q6H PRN    polyethylene glycol (GLYCOLAX) packet 17 g  17 g Oral Daily PRN    acetaminophen (TYLENOL) tablet 650 mg  650 mg Oral Q6H PRN    Or    acetaminophen (TYLENOL) suppository 650 mg  650 mg Rectal Q6H PRN    0.9 % sodium chloride infusion   IntraVENous  Continuous    insulin lispro (HUMALOG,ADMELOG) injection vial 0-4 Units  0-4 Units SubCUTAneous TID WC    insulin lispro (HUMALOG,ADMELOG) injection vial 0-4 Units  0-4 Units SubCUTAneous Nightly    amLODIPine (NORVASC) tablet 10 mg  10 mg Oral Daily    [Held by provider] metoprolol succinate (TOPROL XL) extended release tablet 50 mg  50 mg Oral Daily    [Held by provider] lisinopril (PRINIVIL;ZESTRIL) tablet 40 mg  40 mg Oral Daily          Lab Review:     Recent Labs     03/16/23  0816 03/17/23  0036   WBC 9.0 7.6   HGB 11.8 9.2*   HCT 34.6* 27.6*   PLT 506* 398       Recent Labs     03/16/23  0816 03/17/23  0036   NA 135* 140   K 3.3* 3.4*   CL 104 109*   CO2 24 24   BUN 11 10   MG 1.8 2.3   ALT 94* 124*       No results found for: "GLUCPOC"       Assessment / Plan:     Acute abdominal pain/intractable nausea vomiting POA: Fevered this AM. Restarted Rocephin, continue Flagyl. Pain possibly due to constipation and /or gastroparesis.  Bowel regimen started. Recent gastric emptying study negative; but patient was on Reglan at that time which may have affected results.  Check stool cultures if any diarrhea.  Lipase within normal limits. Bowel regimen per GI.  IVF.  IV PPI. Sch IV Reglan. IV antiemetics as needed. Iv dilaudid prn. Consult GI.    H. Pylori: Restart Triple therapy. Follow up with GI. Unlikely source of Sx with no gastritis     Hypertensive urgency/history of hypertension POA: Suspect has been noncompliant with medications due to nausea vomiting.  Amlodipine and as tolerated; hold ACE due to n/v. Change coreg to IV lopressor for now. Added clonidine patch. Iv hydralazine prn      Elevated LFTs/ Alk phos POA: Check  CT abdomen pelvis as above.  Neg hepatitis panel.  Monitor.     Hypokalemia/ Hyponatremia POA: Due to IVVD and vomiting.  Replete as needed.  Monitor mag.     Thrombocytosis POA: Likely reactive.  Monitor.     Recent UTI POA: UA no eo of infection.  IV ceftriaxone x 3 days completed. She will   not be able to tolerate oral antibiotics OP due to n/v.     CKD stage III POA: Appears around baseline. Hold ACE for now due to n/v.  Monitor.     Type 2 diabetes POA: Hold home oral agents.  Insulin sliding scale.    Total time spent with patient: 37 Minutes **I personally saw and examined the patient during this time period**                 Care Plan discussed with: Patient and Nursing Staff    Discussed:  Care Plan    Prophylaxis:  Lovenox    Disposition:  Home w/Family           ___________________________________________________    Attending Physician: Reggy Eye, MD

## 2023-03-18 NOTE — Progress Notes (Signed)
Melinda Buske, PA-C                       [phone] office             Monday-Friday 8:00 am-4:30 pm  I am not permitted to use "perfect serve" use above for contact, thanks.        Gastroenterology Progress Note    March 18, 2023  Admit Date: 03/16/2023         Narrative Assessment and Plan   42 year old female with past medical history of type 2 diabetes on insulin, hypertension, hypercholesterolemia, admitted 03/16/2023 with abdominal pain, nausea, vomiting.       Suspected gastroparesis on Reglan, which she takes once daily at home. Gastric emptying studies have been normal here and at [facility]. She was on Reglan for the study here.      Recent H pylori infection diagnosed on EGD pathology, treated with bismuth, Flagyl, doxycycline therapy prescribed 02/14/23. She reports she has been taking this 4x daily as prescribed and had two days left.      CT abdomen pelvis from 02/17/2023 does demonstrate some fecal stasis and increased stool burden.      Patient reported severe epigastric abdominal pain last night, temperature 100.78F. Repeat CT with distal esophagitis. Less N/V today.      Impression:  Abdominal pain, nausea, and vomiting      Plan:  Esophagitis likely related to N/V. Hgb down today at 9.2, continue with IV pantoprazole 40 mg BID.    Only small pebble like BM after enema and suppository yesterday. Start Golytely prep today with 2L, repeat another 2L tomorrow.   Reglan stopped for now given questionable diagnosis of gastroparesis and relatively well managed diabetes. Can use Zofran or compazine PRN.   RUQ [place] within normal limits, ceftriaxone therapy scheduled to end today. Hepatic function panel in process today, continue to monitor.   H pylori treatment has been started by primary team, monitor for improvements.     Melinda Harder, PA-C    Subjective:   Chief Complaint: Abdominal Pain, Nausea, and  Vomiting     History of Present Illness: Melinda [name] is a 42 year old female with past medical history of type 2 diabetes (on Novolin 70-30 insulin 15 units twice daily, last A1c 8.5 on 02/10/23), hypertension, and hypercholesterolemia, admitted 03/16/23 with abdominal pain, nausea, and vomiting.  She takes Reglan once per day at home, and reports that she is compliant with eating small, mainly pured meals.  She reports her symptoms get better for several days at a time, then worse again.  She has a bowel movement every other day to every couple of days at home.      LFTs elevated this admission. No new medications, no alcohol or drug use. S/p cholecystectomy.      03/17/23 : Interpreter Alonna Minium [phone].Patient with some improvement in N/V overnight.    03/18/23:  Interpreter Doree Fudge #[id number]. Patient with worsening pain last night and mild fever T 100.3. Some improvement in N/V.       Several recent admissions for the same:      07/08/21 admitted at [facility] with abd pain, nausea, vomiting. Assessed by GI with concern for gastroparesis, diet modifications recommended.      01/14/23-01/20/23 admitted at [facility] with abd pain, nausea and vomiting and melena. EGD 01/17/23 with retained food in stomach, repeat EGD 01/18/23 unremarkable. Colonoscopy 01/19/23 with small internal hemorrhoids, Pillcam administered  01/19/23.      02/01/23-02/04/23 admitted at [facility] with abd pain, nausea, and vomiting. Abnormal HIDA scan, patient underwent cholecystectomy.      02/10/23-02/18/23 admitted at [facility] with abdominal pain. Fluid in gallbladder fossa drained by IR, culture negative.  Started on bismuth, Flagyl, doxycycline, PPI x 14 days on discharge.     02/19/23-02/20/23 admitted at [facility] for the same.  No clear etiology found at that time.     03/11/2023 to 03/14/2023 admitted to [facility] for epigastric abdominal pain, nausea, and vomiting.  Met sepsis criteria for UTI this admission, treated with  IV Rocephin.  Gastric emptying study 03/14/2023 normal.  CT repeated this admission, reported as negative.          ROS:  The previous review of systems on initial consultation / H&P is noted and reviewed.  Specific changes noted above in HPI.    Current Medications:     Current Facility-Administered Medications   Medication Dose Route Frequency    metoclopramide (REGLAN) injection 5 mg  5 mg IntraVENous 4x Daily AC & HS    pantoprazole (PROTONIX) 40 mg in sodium chloride (PF) 0.9 % 10 mL injection  40 mg IntraVENous Q12H    bismuth subsalicylate (PEPTO BISMOL) 262 MG/15ML suspension 15 mL  15 mL Oral 4x Daily    metroNIDAZOLE (FLAGYL) tablet 500 mg  500 mg Oral 3 times per day    doxycycline hyclate (VIBRA-TABS) tablet 100 mg  100 mg Oral 2 times per day    metoprolol (LOPRESSOR) injection 5 mg  5 mg IntraVENous Q8H    prochlorperazine (COMPAZINE) injection 10 mg  10 mg IntraVENous Q6H PRN    hydrALAZINE (APRESOLINE) injection 10 mg  10 mg IntraVENous Q6H PRN    cloNIDine (CATAPRES) 0.2 MG/24HR 1 patch  1 patch TransDERmal Weekly    polyethylene glycol (GLYCOLAX) packet 17 g  17 g Oral TID    HYDROmorphone (DILAUDID) injection 0.5 mg  0.5 mg IntraVENous Q4H PRN    sodium chloride flush 0.9 % injection 5-40 mL  5-40 mL IntraVENous 2 times per day    sodium chloride flush 0.9 % injection 5-40 mL  5-40 mL IntraVENous PRN    0.9 % sodium chloride infusion   IntraVENous PRN    potassium chloride (KLOR-CON) extended release tablet 40 mEq  40 mEq Oral PRN    Or    potassium bicarb-citric acid (EFFER-K) effervescent tablet 40 mEq  40 mEq Oral PRN    Or    potassium chloride 10 mEq/100 mL IVPB (Peripheral Line)  10 mEq IntraVENous PRN    magnesium sulfate 2000 mg in 50 mL IVPB premix  2,000 mg IntraVENous PRN    enoxaparin (LOVENOX) injection 40 mg  40 mg SubCUTAneous Daily    ondansetron (ZOFRAN-ODT) disintegrating tablet 4 mg  4 mg Oral Q8H PRN    Or    ondansetron (ZOFRAN) injection 4 mg  4 mg IntraVENous Q6H PRN     polyethylene glycol (GLYCOLAX) packet 17 g  17 g Oral Daily PRN    acetaminophen (TYLENOL) tablet 650 mg  650 mg Oral Q6H PRN    Or    acetaminophen (TYLENOL) suppository 650 mg  650 mg Rectal Q6H PRN    0.9 % sodium chloride infusion   IntraVENous Continuous    insulin lispro (HUMALOG,ADMELOG) injection vial 0-4 Units  0-4 Units SubCUTAneous TID WC    insulin lispro (HUMALOG,ADMELOG) injection vial 0-4 Units  0-4  Units SubCUTAneous Nightly    cefTRIAXone (ROCEPHIN) 1,000 mg in sterile water 10 mL IV syringe  1,000 mg IntraVENous Q24H    amLODIPine (NORVASC) tablet 10 mg  10 mg Oral Daily    [Held by provider] metoprolol succinate (TOPROL XL) extended release tablet 50 mg  50 mg Oral Daily    [Held by provider] lisinopril (PRINIVIL;ZESTRIL) tablet 40 mg  40 mg Oral Daily       Objective:     VITALS:   Last 24hrs VS reviewed since prior progress note. Most recent are:  Vitals:    03/18/23 1100   BP: (!) 173/89   Pulse: 98   Resp: 11   Temp: 98.4 F (36.9 C)   SpO2: 98%     Temp (24hrs), Avg:98.9 F (37.2 C), Min:98.4 F (36.9 C), Max:100.3 F (37.9 C)    No intake or output data in the 24 hours ending 03/18/23 1218      EXAM:  General:  Comfortable, no distress    HEENT:  Atraumatic skull, pupils equal  Lungs:   No cough or increased work of breathing.   Speaking in complete sentences  Musc:   No skeletal defects or deformities  Neurologic:   Cranial nerves grossly intact, moves all 4 extremities  Psych:    Good insight. Not anxious nor agitated  Derm:   No rash, jaundice, nor palpable dermatologic mass    Lab Data Reviewed:   Recent Labs     03/16/23  0816 03/17/23  0036   WBC 9.0 7.6   HGB 11.8 9.2*   HCT 34.6* 27.6*   PLT 506* 398     Recent Labs     03/16/23  0816 03/17/23  0036   NA 135* 140   K 3.3* 3.4*   CL 104 109*   CO2 24 24   BUN 11 10   MG 1.8 2.3   ALT 94* 124*     No results found for: "GLUCPOC"  No results for input(s): "PH", "PCO2", "PO2", "HCO3", "FIO2" in the last 72 hours.  No results for  input(s): "INR" in the last 72 hours.        Assessment:   (See above)  Principal Problem:    Intractable nausea and vomiting  Resolved Problems:    * No resolved hospital problems. *      Plan:   (See above)      Signed By: Melinda Harder, PA-C     03/18/2023  12:18 PM

## 2023-03-18 NOTE — Consults (Signed)
Session ID: [id number]  Language: Spanish  Interpreter ID: #[id number]  Interpreter Name: Doree Fudge

## 2023-03-18 NOTE — Wound Image (Signed)
Wound Consult:   New assessment visit. Chart reviewed.  Consulted for "left great toe wound".  Spoke with patients nurse,  Mardene Celeste, RN.  Patient is resting on a stryker bed with LAL mattress.    Patient is alert and communicative; requires independently moveable for assessment    Assessment:  Left great toe- unroofed bullae, moist, red, blanchable, serous drainage, painful, no odor.      Treatment:  Cleansed with NSS, covered with Hydrocolloid patch.    Wound Recommendations:  Cleanse with NSS and apply hydrocolloid patch. Change every 72 hours.    Plan:  Spoke with Dr. Genice Rouge regarding findings and proposed orders for treatment.    Please re-consult should concerns arise despite continued skin/PI prevention measures.    Guerry Bruin, RN  [facility], Wound Department  Office [phone]

## 2023-03-18 NOTE — Consults (Signed)
Session ID: [id number]  Language: Spanish  Interpreter ID: #[id number]  Interpreter Name: Cammy Copa

## 2023-03-19 ENCOUNTER — Inpatient Hospital Stay: Admit: 2023-03-19 | Payer: MEDICAID | Primary: Adult Health

## 2023-03-19 LAB — CBC WITH AUTO DIFFERENTIAL
Basophils %: 1 % (ref 0–1)
Basophils Absolute: 0.1 10*3/uL (ref 0.0–0.1)
Eosinophils %: 2 % (ref 0–7)
Eosinophils Absolute: 0.2 10*3/uL (ref 0.0–0.4)
Hematocrit: 29.5 % — ABNORMAL LOW (ref 35.0–47.0)
Hemoglobin: 9.8 g/dL — ABNORMAL LOW (ref 11.5–16.0)
Immature Granulocytes %: 0 % (ref 0.0–0.5)
Immature Granulocytes Absolute: 0 10*3/uL (ref 0.00–0.04)
Lymphocytes %: 23 % (ref 12–49)
Lymphocytes Absolute: 1.4 10*3/uL (ref 0.8–3.5)
MCH: 27.4 PG (ref 26.0–34.0)
MCHC: 33.2 g/dL (ref 30.0–36.5)
MCV: 82.4 FL (ref 80.0–99.0)
MPV: 8.8 FL — ABNORMAL LOW (ref 8.9–12.9)
Monocytes %: 7 % (ref 5–13)
Monocytes Absolute: 0.5 10*3/uL (ref 0.0–1.0)
Neutrophils %: 67 % (ref 32–75)
Neutrophils Absolute: 4.2 10*3/uL (ref 1.8–8.0)
Nucleated RBCs: 0 PER 100 WBC
Platelets: 421 10*3/uL — ABNORMAL HIGH (ref 150–400)
RBC: 3.58 M/uL — ABNORMAL LOW (ref 3.80–5.20)
RDW: 19 % — ABNORMAL HIGH (ref 11.5–14.5)
WBC: 6.3 10*3/uL (ref 3.6–11.0)
nRBC: 0 10*3/uL (ref 0.00–0.01)

## 2023-03-19 LAB — POCT GLUCOSE
POC Glucose: 165 mg/dL — ABNORMAL HIGH (ref 65–117)
POC Glucose: 178 mg/dL — ABNORMAL HIGH (ref 65–117)
POC Glucose: 153 mg/dL — ABNORMAL HIGH (ref 65–117)
POC Glucose: 185 mg/dL — ABNORMAL HIGH (ref 65–117)
POC Glucose: 161 mg/dL — ABNORMAL HIGH (ref 65–117)

## 2023-03-19 LAB — SEDIMENTATION RATE: Sed Rate, Automated: 61 mm/hr — ABNORMAL HIGH (ref 0–20)

## 2023-03-19 LAB — COMPREHENSIVE METABOLIC PANEL W/ REFLEX TO MG FOR LOW K
ALT: 150 U/L — ABNORMAL HIGH (ref 12–78)
AST: 98 U/L — ABNORMAL HIGH (ref 15–37)
Albumin/Globulin Ratio: 0.7 — ABNORMAL LOW (ref 1.1–2.2)
Albumin: 2.3 g/dL — ABNORMAL LOW (ref 3.5–5.0)
Alk Phosphatase: 153 U/L — ABNORMAL HIGH (ref 45–117)
Anion Gap: 9 mmol/L (ref 5–15)
BUN/Creatinine Ratio: 9 — ABNORMAL LOW (ref 12–20)
BUN: 8 MG/DL (ref 6–20)
CO2: 22 mmol/L (ref 21–32)
Calcium: 8.4 MG/DL — ABNORMAL LOW (ref 8.5–10.1)
Chloride: 107 mmol/L (ref 97–108)
Creatinine: 0.94 MG/DL (ref 0.55–1.02)
Est, Glom Filt Rate: 79 mL/min/{1.73_m2} (ref >60)
Globulin: 3.3 g/dL (ref 2.0–4.0)
Glucose: 180 mg/dL — ABNORMAL HIGH (ref 65–100)
Potassium: 3.1 mmol/L — ABNORMAL LOW (ref 3.5–5.1)
Sodium: 138 mmol/L (ref 136–145)
Total Bilirubin: 0.2 MG/DL (ref 0.2–1.0)
Total Protein: 5.6 g/dL — ABNORMAL LOW (ref 6.4–8.2)

## 2023-03-19 LAB — C-REACTIVE PROTEIN: CRP: <0.29 mg/dL (ref 0.00–0.30)

## 2023-03-19 LAB — PHOSPHORUS: Phosphorus: 3 MG/DL (ref 2.6–4.7)

## 2023-03-19 LAB — PROTIME-INR
INR: 1 (ref 0.9–1.1)
Protime: 10.7 s (ref 9.0–11.1)

## 2023-03-19 LAB — MAGNESIUM: Magnesium: 1.6 mg/dL (ref 1.6–2.4)

## 2023-03-19 LAB — BILIRUBIN, DIRECT: Bilirubin, Direct: 0.1 MG/DL (ref 0.0–0.2)

## 2023-03-19 LAB — EXTRA TUBES HOLD

## 2023-03-19 LAB — D-DIMER, QUANTITATIVE: D-Dimer, Quant: 0.61 mg/L FEU (ref 0.00–0.65)

## 2023-03-19 LAB — PROCALCITONIN: Procalcitonin: 0.06 ng/mL

## 2023-03-19 LAB — LIPASE: Lipase: 17 U/L (ref 13–75)

## 2023-03-19 LAB — LACTIC ACID: Lactic Acid, Plasma: 0.4 MMOL/L (ref 0.4–2.0)

## 2023-03-19 MED ORDER — FAMOTIDINE 20 MG PO TABS
20 | Freq: Two times a day (BID) | ORAL | Status: DC
Start: 2023-03-19 — End: 2023-03-21
  Administered 2023-03-20 – 2023-03-21 (×3): 20 mg via ORAL

## 2023-03-19 MED ORDER — CYCLOBENZAPRINE HCL 10 MG PO TABS
10 | Freq: Two times a day (BID) | ORAL | Status: DC
Start: 2023-03-19 — End: 2023-03-19

## 2023-03-19 MED ORDER — BISACODYL 5 MG PO TBEC
5 MG | ORAL | Status: DC
Start: 2023-03-19 — End: 2023-03-21
  Administered 2023-03-21: 13:00:00 5 mg via ORAL

## 2023-03-19 MED ORDER — CYCLOBENZAPRINE HCL 10 MG PO TABS
10 MG | Freq: Two times a day (BID) | ORAL | Status: DC
Start: 2023-03-19 — End: 2023-03-23
  Administered 2023-03-19 – 2023-03-23 (×8): 10 mg via ORAL

## 2023-03-19 MED FILL — BISACODYL EC 5 MG PO TBEC: 5 MG | ORAL | Qty: 1

## 2023-03-19 MED FILL — HYDRALAZINE HCL 20 MG/ML IJ SOLN: 20 MG/ML | INTRAMUSCULAR | Qty: 1

## 2023-03-19 MED FILL — HYDROMORPHONE HCL 1 MG/ML IJ SOLN: 1 MG/ML | INTRAMUSCULAR | Qty: 1

## 2023-03-19 MED FILL — PANTOPRAZOLE SODIUM 40 MG IV SOLR: 40 MG | INTRAVENOUS | Qty: 40

## 2023-03-19 MED FILL — AMLODIPINE BESYLATE 5 MG PO TABS: 5 MG | ORAL | Qty: 2

## 2023-03-19 MED FILL — METOPROLOL TARTRATE 5 MG/5ML IV SOLN: 5 MG/ML | INTRAVENOUS | Qty: 5

## 2023-03-19 MED FILL — PROCHLORPERAZINE EDISYLATE 10 MG/2ML IJ SOLN: 10 MG/2ML | INTRAMUSCULAR | Qty: 2

## 2023-03-19 MED FILL — SUCRALFATE 1 G PO TABS: 1 GM | ORAL | Qty: 1

## 2023-03-19 MED FILL — METRONIDAZOLE 250 MG PO TABS: 250 MG | ORAL | Qty: 2

## 2023-03-19 MED FILL — BISMUTH SUBSALICYLATE 262 MG/15ML PO SUSP: 262 MG/15ML | ORAL | Qty: 15

## 2023-03-19 MED FILL — ACETAMINOPHEN 325 MG PO TABS: 325 MG | ORAL | Qty: 2

## 2023-03-19 MED FILL — POTASSIUM CHLORIDE ER 10 MEQ PO TBCR: 10 MEQ | ORAL | Qty: 4

## 2023-03-19 MED FILL — ONDANSETRON 4 MG PO TBDP: 4 MG | ORAL | Qty: 1

## 2023-03-19 MED FILL — FAMOTIDINE 20 MG PO TABS: 20 MG | ORAL | Qty: 1

## 2023-03-19 MED FILL — ENOXAPARIN SODIUM 40 MG/0.4ML IJ SOSY: 40 MG/0.4ML | INTRAMUSCULAR | Qty: 0.4

## 2023-03-19 MED FILL — DOXYCYCLINE HYCLATE 100 MG PO TABS: 100 MG | ORAL | Qty: 1

## 2023-03-19 MED FILL — CYCLOBENZAPRINE HCL 10 MG PO TABS: 10 MG | ORAL | Qty: 1

## 2023-03-19 MED FILL — METOPROLOL SUCCINATE ER 50 MG PO TB24: 50 MG | ORAL | Qty: 1

## 2023-03-19 MED FILL — CEFTRIAXONE SODIUM 1 G IJ SOLR: 1 g | INTRAMUSCULAR | Qty: 1000

## 2023-03-19 MED FILL — ONDANSETRON HCL 4 MG/2ML IJ SOLN: 4 MG/2ML | INTRAMUSCULAR | Qty: 2

## 2023-03-19 NOTE — Plan of Care (Signed)
Problem: Pain  Goal: Verbalizes/displays adequate comfort level or baseline comfort level  Outcome: Progressing

## 2023-03-19 NOTE — Progress Notes (Signed)
[facility]  [address] [place] [place], [place] [address]  [phone]      Medical Progress Note      NAME: Melinda [name]   DOB:  [birth year]  MRM:  [id number]    Date/Time of service: 03/19/2023        Subjective:     Chief Complaint:  Patient was personally seen and examined by me during this time period.  Chart reviewed. Fu abdominal pain. Continues to report abd pain and n/v. Family at bedside.  Lipase repeated and normal, lactate normal.  Procalcitonin normal.  CRP normal.  Continuing aggressive bowel regimen.    Patient does have edema of bilateral upper arms, duplex ultrasound pending       Objective:       Vitals:       Last 24hrs VS reviewed since prior progress note. Most recent are:    Vitals:    03/19/23 1343   BP:    Pulse:    Resp: 14   Temp:    SpO2:      SpO2 Readings from Last 6 Encounters:   03/19/23 100%   02/17/23 95%   02/14/23 98%   02/04/23 97%   01/20/23 98%   06/29/22 100%          Intake/Output Summary (Last 24 hours) at 03/19/2023 1422  Last data filed at 03/19/2023 1118  Gross per 24 hour   Intake 240 ml   Output --   Net 240 ml          Exam:     Physical Exam:      Gen:  Well-developed, well-nourished, in no acute distress  HEENT:  Pink conjunctivae, PERRL, hearing intact to voice  Resp:  No accessory muscle use, clear breath sounds without wheezes rales or rhonchi  Card:  tachycardia, No murmurs, normal S1, S2, no peripheral edema  Abd:  abd TTP, non-distended, normoactive bowel sounds are present  Musc:  No cyanosis or clubbing  Skin:  No rashes or ulcers, skin turgor is good  Neuro:  Cranial nerves 3-12 are grossly intact, follows commands appropriately  Psych:  Oriented to person, place, and time, Alert with good insight      Medications Reviewed: (see below)    Lab Data Reviewed: (see below)    ______________________________________________________________________    Medications:     Current Facility-Administered Medications   Medication Dose Route  Frequency    famotidine (PEPCID) tablet 20 mg  20 mg Oral BID    bisacodyl (DULCOLAX) EC tablet 5 mg  5 mg Oral Every Other Day    metroNIDAZOLE (FLAGYL) tablet 500 mg  500 mg Oral 3 times per day    polyethylene glycol (GoLYTELY) solution 2,000 mL  2,000 mL Oral Daily    cefTRIAXone (ROCEPHIN) 1,000 mg in sterile water 10 mL IV syringe  1,000 mg IntraVENous Q24H    HYDROmorphone HCl PF (DILAUDID) injection 1 mg  1 mg IntraVENous Q4H PRN    sucralfate (CARAFATE) tablet 1 g  1 g Oral 4 times per day    glucose chewable tablet 16 g  4 tablet Oral PRN    dextrose bolus 10% 125 mL  125 mL IntraVENous PRN    Or    dextrose bolus 10% 250 mL  250 mL IntraVENous PRN    glucagon injection 1 mg  1 mg SubCUTAneous PRN    dextrose 10 % infusion   IntraVENous Continuous PRN  metoprolol (LOPRESSOR) injection 5 mg  5 mg IntraVENous Q8H    prochlorperazine (COMPAZINE) injection 10 mg  10 mg IntraVENous Q6H PRN    hydrALAZINE (APRESOLINE) injection 10 mg  10 mg IntraVENous Q6H PRN    cloNIDine (CATAPRES) 0.2 MG/24HR 1 patch  1 patch TransDERmal Weekly    sodium chloride flush 0.9 % injection 5-40 mL  5-40 mL IntraVENous 2 times per day    sodium chloride flush 0.9 % injection 5-40 mL  5-40 mL IntraVENous PRN    0.9 % sodium chloride infusion   IntraVENous PRN    potassium chloride (KLOR-CON) extended release tablet 40 mEq  40 mEq Oral PRN    Or    potassium bicarb-citric acid (EFFER-K) effervescent tablet 40 mEq  40 mEq Oral PRN    Or    potassium chloride 10 mEq/100 mL IVPB (Peripheral Line)  10 mEq IntraVENous PRN    magnesium sulfate 2000 mg in 50 mL IVPB premix  2,000 mg IntraVENous PRN    enoxaparin (LOVENOX) injection 40 mg  40 mg SubCUTAneous Daily    ondansetron (ZOFRAN-ODT) disintegrating tablet 4 mg  4 mg Oral Q8H PRN    Or    ondansetron (ZOFRAN) injection 4 mg  4 mg IntraVENous Q6H PRN    polyethylene glycol (GLYCOLAX) packet 17 g  17 g Oral Daily PRN    acetaminophen (TYLENOL) tablet 650 mg  650 mg Oral Q6H PRN    Or     acetaminophen (TYLENOL) suppository 650 mg  650 mg Rectal Q6H PRN    0.9 % sodium chloride infusion   IntraVENous Continuous    insulin lispro (HUMALOG,ADMELOG) injection vial 0-4 Units  0-4 Units SubCUTAneous TID WC    insulin lispro (HUMALOG,ADMELOG) injection vial 0-4 Units  0-4 Units SubCUTAneous Nightly    amLODIPine (NORVASC) tablet 10 mg  10 mg Oral Daily    metoprolol succinate (TOPROL XL) extended release tablet 50 mg  50 mg Oral Daily    [Held by provider] lisinopril (PRINIVIL;ZESTRIL) tablet 40 mg  40 mg Oral Daily          Lab Review:     Recent Labs     03/17/23  0036 03/19/23  0523   WBC 7.6 6.3   HGB 9.2* 9.8*   HCT 27.6* 29.5*   PLT 398 421*       Recent Labs     03/17/23  0036 03/18/23  1545 03/19/23  0520 03/19/23  0523   NA 140  --   --  138   K 3.4*  --   --  3.1*   CL 109*  --   --  107   CO2 24  --   --  22   BUN 10  --   --  8   MG 2.3  --   --  1.6   PHOS  --   --   --  3.0   ALT 124* 141*  --  150*   INR  --   --  1.0  --        No results found for: "GLUCPOC"       Assessment / Plan:     Acute abdominal pain/intractable nausea vomiting POA: Fevered on 6/7, restarted Rocephin, continue Flagyl. Pain possibly due to constipation and /or gastroparesis.  Bowel regimen started. Recent gastric emptying study negative; but patient was on Reglan at that time which may have affected results.  Check stool cultures if any  diarrhea.  Lipase, Pro-Cal, lactate, CRP all normal.  Bowel regimen per GI.  IVF.  IV PPI. Sch IV Reglan. IV antiemetics as needed. Iv dilaudid prn. Consult GI.    H. Pylori: Discontinue triple therapy per GI     Hypertensive urgency/history of hypertension POA: Suspect has been noncompliant with medications due to nausea vomiting.  Amlodipine and as tolerated; hold ACE due to n/v. Change coreg to IV lopressor for now. Added clonidine patch. Iv hydralazine prn      Elevated LFTs/ Alk phos POA: Check CT abdomen pelvis as above.  Neg hepatitis panel.  Monitor.     Hypokalemia/  Hyponatremia POA: Due to IVVD and vomiting.  Replete as needed.  Monitor mag.     Thrombocytosis POA: Likely reactive.  Monitor.     Recent UTI POA: UA no eo of infection.  IV ceftriaxone x 3 days completed. She will  not be able to tolerate oral antibiotics OP due to n/v.     CKD stage III POA: Appears around baseline. Hold ACE for now due to n/v.  Monitor.     Type 2 diabetes POA: Hold home oral agents.  Insulin sliding scale.    Total time spent with patient: 53 Minutes **I personally saw and examined the patient during this time period**                 Care Plan discussed with: Patient and Nursing Staff    Discussed:  Care Plan    Prophylaxis:  Lovenox    Disposition:  Home w/Family           ___________________________________________________    Attending Physician: Reggy Eye, MD

## 2023-03-19 NOTE — Progress Notes (Signed)
1610 per patient golytely is causing her to vomit, declined.    9604 notified MD that patient is having episode of vomiting, compazine was given earlier and zofran was given an hour ago.    0847 AM PO not given due to vomiting. Patient believes that abd pain is what makes nausea worse and pain makes her vomit.     1137 BP 183/101 notified MD. Hydralazine 10mg  given. MD awaiting labs.     1256 notified MD that Lipase, CRP WNL. Patient has 3+ swelling bilaterally upper arm, provider order D-dimer and duplex.     1548 Patient asking for Muscle relaxer, due to pain she tenses up causing neck and shoulder strain. Asked MD for an order.

## 2023-03-19 NOTE — Progress Notes (Signed)
Bilateral UE venous duplex completed. Final report to follow.

## 2023-03-19 NOTE — Plan of Care (Signed)
Problem: Discharge Planning  Goal: Discharge to home or other facility with appropriate resources  Outcome: Progressing     Problem: Pain  Goal: Verbalizes/displays adequate comfort level or baseline comfort level  Outcome: Progressing     Problem: Chronic Conditions and Co-morbidities  Goal: Patient's chronic conditions and co-morbidity symptoms are monitored and maintained or improved  Outcome: Progressing     Problem: Safety - Adult  Goal: Free from fall injury  Outcome: Progressing     Problem: Skin/Tissue Integrity  Goal: Absence of new skin breakdown  Description: 1.  Monitor for areas of redness and/or skin breakdown  2.  Assess vascular access sites hourly  3.  Every 4-6 hours minimum:  Change oxygen saturation probe site  4.  Every 4-6 hours:  If on nasal continuous positive airway pressure, respiratory therapy assess nares and determine need for appliance change or resting period.  Outcome: Progressing

## 2023-03-20 ENCOUNTER — Inpatient Hospital Stay: Admit: 2023-03-20 | Payer: MEDICAID | Primary: Adult Health

## 2023-03-20 LAB — RENAL FUNCTION PANEL
Albumin: 2.2 g/dL — ABNORMAL LOW (ref 3.5–5.0)
Anion Gap: 5 mmol/L (ref 5–15)
BUN/Creatinine Ratio: 9 — ABNORMAL LOW (ref 12–20)
BUN: 10 MG/DL (ref 6–20)
CO2: 26 mmol/L (ref 21–32)
Calcium: 8.2 MG/DL — ABNORMAL LOW (ref 8.5–10.1)
Chloride: 108 mmol/L (ref 97–108)
Creatinine: 1.11 MG/DL — ABNORMAL HIGH (ref 0.55–1.02)
Est, Glom Filt Rate: 64 mL/min/{1.73_m2} (ref >60)
Glucose: 178 mg/dL — ABNORMAL HIGH (ref 65–100)
Phosphorus: 3.2 MG/DL (ref 2.6–4.7)
Potassium: 3.6 mmol/L (ref 3.5–5.1)
Sodium: 139 mmol/L (ref 136–145)

## 2023-03-20 LAB — COMPREHENSIVE METABOLIC PANEL W/ REFLEX TO MG FOR LOW K
ALT: 86 U/L — ABNORMAL HIGH (ref 12–78)
AST: 32 U/L (ref 15–37)
Albumin/Globulin Ratio: 0.8 — ABNORMAL LOW (ref 1.1–2.2)
Albumin: 2.1 g/dL — ABNORMAL LOW (ref 3.5–5.0)
Alk Phosphatase: 115 U/L (ref 45–117)
Anion Gap: 5 mmol/L (ref 5–15)
BUN/Creatinine Ratio: 8 — ABNORMAL LOW (ref 12–20)
BUN: 9 MG/DL (ref 6–20)
CO2: 26 mmol/L (ref 21–32)
Calcium: 8.1 MG/DL — ABNORMAL LOW (ref 8.5–10.1)
Chloride: 108 mmol/L (ref 97–108)
Creatinine: 1.11 MG/DL — ABNORMAL HIGH (ref 0.55–1.02)
Est, Glom Filt Rate: 64 mL/min/{1.73_m2} (ref >60)
Globulin: 2.6 g/dL (ref 2.0–4.0)
Glucose: 169 mg/dL — ABNORMAL HIGH (ref 65–100)
Potassium: 3.4 mmol/L — ABNORMAL LOW (ref 3.5–5.1)
Sodium: 139 mmol/L (ref 136–145)
Total Bilirubin: 0.2 MG/DL (ref 0.2–1.0)
Total Protein: 4.7 g/dL — ABNORMAL LOW (ref 6.4–8.2)

## 2023-03-20 LAB — CBC WITH AUTO DIFFERENTIAL
Basophils %: 1 % (ref 0–1)
Basophils Absolute: 0.1 10*3/uL (ref 0.0–0.1)
Eosinophils %: 1 % (ref 0–7)
Eosinophils Absolute: 0.1 10*3/uL (ref 0.0–0.4)
Hematocrit: 26.1 % — ABNORMAL LOW (ref 35.0–47.0)
Hemoglobin: 8.7 g/dL — ABNORMAL LOW (ref 11.5–16.0)
Immature Granulocytes %: 0 % (ref 0.0–0.5)
Immature Granulocytes Absolute: 0 10*3/uL (ref 0.00–0.04)
Lymphocytes %: 26 % (ref 12–49)
Lymphocytes Absolute: 1.6 10*3/uL (ref 0.8–3.5)
MCH: 27.7 PG (ref 26.0–34.0)
MCHC: 33.3 g/dL (ref 30.0–36.5)
MCV: 83.1 FL (ref 80.0–99.0)
Monocytes %: 9 % (ref 5–13)
Monocytes Absolute: 0.5 10*3/uL (ref 0.0–1.0)
Neutrophils %: 63 % (ref 32–75)
Neutrophils Absolute: 3.7 10*3/uL (ref 1.8–8.0)
Nucleated RBCs: 0 PER 100 WBC
Platelets: 304 10*3/uL (ref 150–400)
RBC: 3.14 M/uL — ABNORMAL LOW (ref 3.80–5.20)
RDW: 19.6 % — ABNORMAL HIGH (ref 11.5–14.5)
WBC: 6 10*3/uL (ref 3.6–11.0)
nRBC: 0 10*3/uL (ref 0.00–0.01)

## 2023-03-20 LAB — HEPATIC FUNCTION PANEL
ALT: 98 U/L — ABNORMAL HIGH (ref 12–78)
AST: 37 U/L (ref 15–37)
Albumin/Globulin Ratio: 0.7 — ABNORMAL LOW (ref 1.1–2.2)
Albumin: 2.1 g/dL — ABNORMAL LOW (ref 3.5–5.0)
Alk Phosphatase: 119 U/L — ABNORMAL HIGH (ref 45–117)
Bilirubin, Direct: 0.1 MG/DL (ref 0.0–0.2)
Globulin: 3 g/dL (ref 2.0–4.0)
Total Bilirubin: <0.1 MG/DL — ABNORMAL LOW (ref 0.2–1.0)
Total Protein: 5.1 g/dL — ABNORMAL LOW (ref 6.4–8.2)

## 2023-03-20 LAB — POCT GLUCOSE
POC Glucose: 187 mg/dL — ABNORMAL HIGH (ref 65–117)
POC Glucose: 172 mg/dL — ABNORMAL HIGH (ref 65–117)
POC Glucose: 163 mg/dL — ABNORMAL HIGH (ref 65–117)
POC Glucose: 69 mg/dL (ref 65–117)
POC Glucose: 169 mg/dL — ABNORMAL HIGH (ref 65–117)

## 2023-03-20 LAB — MAGNESIUM
Magnesium: 1.7 mg/dL (ref 1.6–2.4)
Magnesium: 1.6 mg/dL (ref 1.6–2.4)

## 2023-03-20 LAB — PROTIME-INR
INR: 1 (ref 0.9–1.1)
Protime: 10.9 s (ref 9.0–11.1)

## 2023-03-20 MED ORDER — LORAZEPAM 0.5 MG PO TABS
0.5 MG | ORAL | Status: DC | PRN
Start: 2023-03-20 — End: 2023-03-23
  Administered 2023-03-20 – 2023-03-21 (×3): 0.5 mg via ORAL

## 2023-03-20 MED ORDER — SCOPOLAMINE 1 MG/3DAYS TD PT72
1 | TRANSDERMAL | Status: DC
Start: 2023-03-20 — End: 2023-03-23
  Administered 2023-03-20 – 2023-03-23 (×2): 1 via TRANSDERMAL

## 2023-03-20 MED ORDER — HYDROMORPHONE HCL PF 1 MG/ML IJ SOLN
1 MG/ML | INTRAMUSCULAR | Status: AC | PRN
Start: 2023-03-20 — End: 2023-03-22
  Administered 2023-03-20 – 2023-03-21 (×6): 1 mg via INTRAVENOUS

## 2023-03-20 MED ORDER — METOPROLOL SUCCINATE ER 50 MG PO TB24
50 MG | Freq: Every day | ORAL | Status: AC
Start: 2023-03-20 — End: 2023-03-23
  Administered 2023-03-21 – 2023-03-23 (×3): 100 mg via ORAL

## 2023-03-20 MED ORDER — IOPAMIDOL 76 % IV SOLN
76 | Freq: Once | INTRAVENOUS | Status: AC | PRN
Start: 2023-03-20 — End: 2023-03-20
  Administered 2023-03-20: 18:00:00 100 mL via INTRAVENOUS

## 2023-03-20 MED ORDER — METOCLOPRAMIDE HCL 5 MG/ML IJ SOLN
5 MG/ML | Freq: Four times a day (QID) | INTRAMUSCULAR | Status: DC
Start: 2023-03-20 — End: 2023-03-21
  Administered 2023-03-20 – 2023-03-21 (×4): 10 mg via INTRAVENOUS

## 2023-03-20 MED FILL — HYDRALAZINE HCL 20 MG/ML IJ SOLN: 20 MG/ML | INTRAMUSCULAR | Qty: 1

## 2023-03-20 MED FILL — PROCHLORPERAZINE EDISYLATE 10 MG/2ML IJ SOLN: 10 MG/2ML | INTRAMUSCULAR | Qty: 2

## 2023-03-20 MED FILL — METRONIDAZOLE 250 MG PO TABS: 250 MG | ORAL | Qty: 2

## 2023-03-20 MED FILL — CYCLOBENZAPRINE HCL 10 MG PO TABS: 10 MG | ORAL | Qty: 1

## 2023-03-20 MED FILL — CEFTRIAXONE SODIUM 1 G IJ SOLR: 1 g | INTRAMUSCULAR | Qty: 1000

## 2023-03-20 MED FILL — HYDROMORPHONE HCL 1 MG/ML IJ SOLN: 1 MG/ML | INTRAMUSCULAR | Qty: 1

## 2023-03-20 MED FILL — ENOXAPARIN SODIUM 40 MG/0.4ML IJ SOSY: 40 MG/0.4ML | INTRAMUSCULAR | Qty: 0.4

## 2023-03-20 MED FILL — AMLODIPINE BESYLATE 5 MG PO TABS: 5 MG | ORAL | Qty: 2

## 2023-03-20 MED FILL — METOPROLOL TARTRATE 5 MG/5ML IV SOLN: 5 MG/ML | INTRAVENOUS | Qty: 5

## 2023-03-20 MED FILL — SUCRALFATE 1 G PO TABS: 1 GM | ORAL | Qty: 1

## 2023-03-20 MED FILL — ISOVUE-370 76 % IV SOLN: 76 % | INTRAVENOUS | Qty: 100

## 2023-03-20 MED FILL — LORAZEPAM 0.5 MG PO TABS: 0.5 MG | ORAL | Qty: 1

## 2023-03-20 MED FILL — MAGNESIUM SULFATE 2 GM/50ML IV SOLN: 2 GM/50ML | INTRAVENOUS | Qty: 50

## 2023-03-20 MED FILL — EFFER-K 20 MEQ PO TBEF: 20 MEQ | ORAL | Qty: 2

## 2023-03-20 MED FILL — LISINOPRIL 20 MG PO TABS: 20 MG | ORAL | Qty: 2

## 2023-03-20 MED FILL — FAMOTIDINE 20 MG PO TABS: 20 MG | ORAL | Qty: 1

## 2023-03-20 MED FILL — METOCLOPRAMIDE HCL 5 MG/ML IJ SOLN: 5 MG/ML | INTRAMUSCULAR | Qty: 2

## 2023-03-20 MED FILL — ONDANSETRON HCL 4 MG/2ML IJ SOLN: 4 MG/2ML | INTRAMUSCULAR | Qty: 2

## 2023-03-20 MED FILL — METOPROLOL SUCCINATE ER 50 MG PO TB24: 50 MG | ORAL | Qty: 1

## 2023-03-20 MED FILL — SCOPOLAMINE 1 MG/3DAYS TD PT72: 1 MG/3DAYS | TRANSDERMAL | Qty: 1

## 2023-03-20 NOTE — Progress Notes (Signed)
Melinda [name]. Madelaine Bhat, MD  Office [phone]     Gastroenterology Progress Note    March 20, 2023  Admit Date: 03/16/2023         Narrative Assessment and Plan   42 year old female with past medical history of type 2 diabetes on insulin, hypertension, hypercholesterolemia, admitted 03/16/2023 with abdominal pain, nausea, vomiting.       Suspected gastroparesis on Reglan, which she takes once daily at home. Gastric emptying studies have been normal here and at [facility]. She was on Reglan for the study here.      Recent H pylori infection diagnosed on EGD pathology, treated with bismuth, Flagyl, doxycycline therapy prescribed 02/14/23. She reports she has been taking this 4x daily as prescribed and had two days left.      CT abdomen pelvis from 02/17/2023 does demonstrate some fecal stasis and increased stool burden.       Patient reported severe epigastric abdominal pain last night, temperature 100.37F. Repeat CT with distal esophagitis. Less N/V today.      Impression:  Abdominal pain, nausea, and vomiting      Plan:  Esophagitis likely related to N/V. Hgb down today at 9.2, continue with IV pantoprazole 40 mg BID.    Only small pebble like BM after enema and suppository yesterday. Start Golytely prep today with 2L, repeat another 2L tomorrow.   Reglan stopped for now given questionable diagnosis of gastroparesis and relatively well managed diabetes. Can use Zofran or compazine PRN.   RUQ [place] within normal limits, ceftriaxone therapy scheduled to end today. Hepatic function panel in process today, continue to monitor.   H pylori treatment has been started by primary team, monitor for improvements.       6/9 - upper abd pain worsening, is having Bms.  Has been on reglan since admission it seems for possible GES, but she is getting opiates which will interfere with accurate GES as well as make possible delayed gastric emptying worse.  Would resume.  Ok with continuing clears, continue bowel  regimen.    Subjective:   Increased upper abd pain with nausea and clear nonbloody emesis      ROS:  The previous review of systems on initial consultation / H&P is noted and reviewed.  Specific changes noted above in HPI.    Current Medications:     Current Facility-Administered Medications   Medication Dose Route Frequency    HYDROmorphone HCl PF (DILAUDID) injection 1 mg  1 mg IntraVENous Q3H PRN    [START ON 03/21/2023] metoprolol succinate (TOPROL XL) extended release tablet 100 mg  100 mg Oral Daily    metoclopramide (REGLAN) injection 10 mg  10 mg IntraVENous Q6H    famotidine (PEPCID) tablet 20 mg  20 mg Oral BID    bisacodyl (DULCOLAX) EC tablet 5 mg  5 mg Oral Every Other Day    cyclobenzaprine (FLEXERIL) tablet 10 mg  10 mg Oral BID    LORazepam (ATIVAN) tablet 0.5 mg  0.5 mg Oral Q4H PRN    scopolamine (TRANSDERM-SCOP) transdermal patch 1 patch  1 patch TransDERmal Q72H    metroNIDAZOLE (FLAGYL) tablet 500 mg  500 mg Oral 3 times per day    cefTRIAXone (ROCEPHIN) 1,000 mg in sterile water 10 mL IV syringe  1,000 mg IntraVENous Q24H    sucralfate (CARAFATE) tablet 1 g  1 g Oral 4 times per day    glucose chewable tablet 16 g  4 tablet Oral PRN  dextrose bolus 10% 125 mL  125 mL IntraVENous PRN    Or    dextrose bolus 10% 250 mL  250 mL IntraVENous PRN    glucagon injection 1 mg  1 mg SubCUTAneous PRN    dextrose 10 % infusion   IntraVENous Continuous PRN    metoprolol (LOPRESSOR) injection 5 mg  5 mg IntraVENous Q8H    prochlorperazine (COMPAZINE) injection 10 mg  10 mg IntraVENous Q6H PRN    hydrALAZINE (APRESOLINE) injection 10 mg  10 mg IntraVENous Q6H PRN    cloNIDine (CATAPRES) 0.2 MG/24HR 1 patch  1 patch TransDERmal Weekly    sodium chloride flush 0.9 % injection 5-40 mL  5-40 mL IntraVENous 2 times per day    sodium chloride flush 0.9 % injection 5-40 mL  5-40 mL IntraVENous PRN    0.9 % sodium chloride infusion   IntraVENous PRN    potassium chloride (KLOR-CON) extended release tablet 40 mEq  40  mEq Oral PRN    Or    potassium bicarb-citric acid (EFFER-K) effervescent tablet 40 mEq  40 mEq Oral PRN    Or    potassium chloride 10 mEq/100 mL IVPB (Peripheral Line)  10 mEq IntraVENous PRN    magnesium sulfate 2000 mg in 50 mL IVPB premix  2,000 mg IntraVENous PRN    enoxaparin (LOVENOX) injection 40 mg  40 mg SubCUTAneous Daily    ondansetron (ZOFRAN-ODT) disintegrating tablet 4 mg  4 mg Oral Q8H PRN    Or    ondansetron (ZOFRAN) injection 4 mg  4 mg IntraVENous Q6H PRN    polyethylene glycol (GLYCOLAX) packet 17 g  17 g Oral Daily PRN    acetaminophen (TYLENOL) tablet 650 mg  650 mg Oral Q6H PRN    Or    acetaminophen (TYLENOL) suppository 650 mg  650 mg Rectal Q6H PRN    insulin lispro (HUMALOG,ADMELOG) injection vial 0-4 Units  0-4 Units SubCUTAneous TID WC    insulin lispro (HUMALOG,ADMELOG) injection vial 0-4 Units  0-4 Units SubCUTAneous Nightly    amLODIPine (NORVASC) tablet 10 mg  10 mg Oral Daily    lisinopril (PRINIVIL;ZESTRIL) tablet 40 mg  40 mg Oral Daily       Objective:     VITALS:   Last 24hrs VS reviewed since prior progress note. Most recent are:  Vitals:    03/20/23 1132   BP:    Pulse:    Resp: 16   Temp:    SpO2:      Temp (24hrs), Avg:98.2 F (36.8 C), Min:97.7 F (36.5 C), Max:98.8 F (37.1 C)      Intake/Output Summary (Last 24 hours) at 03/20/2023 1233  Last data filed at 03/20/2023 1034  Gross per 24 hour   Intake 370 ml   Output --   Net 370 ml       EXAM:  General: Comfortable, no distress    HEENT: Atraumatic skull, pupils equal  Lungs:  No distress  Speaking in complete sentences  Abdomen: Upper abd TTP  No mass, guarding or rebound  Musc:   No skeletal gross defects or deformities  Neurologic:  lethargic  Psych:    Not anxious nor agitated  Derm:  No rash, jaundice visible    Lab Data Reviewed:   Recent Labs     03/19/23  0523 03/20/23  0126   WBC 6.3 6.0   HGB 9.8* 8.7*   HCT 29.5* 26.1*   PLT 421* 304  Recent Labs     03/18/23  1545 03/19/23  0520 03/19/23  0523  03/20/23  0126   NA  --   --  138  --    K  --   --  3.1*  --    CL  --   --  107  --    CO2  --   --  22  --    BUN  --   --  8  --    MG  --   --  1.6  --    PHOS  --   --  3.0  --    ALT 141*  --  150* 98*   INR  --  1.0  --  1.0     No results found for: "GLUCPOC"  No results for input(s): "PH", "PCO2", "PO2", "HCO3", "FIO2" in the last 72 hours.  Recent Labs     03/19/23  0520 03/20/23  0126   INR 1.0 1.0           Assessment:   (See above)  Principal Problem:    Intractable nausea and vomiting  Resolved Problems:    * No resolved hospital problems. *      Plan:   (See above)      Signed By: Winfield Cunas, MD     03/20/2023  12:33 PM

## 2023-03-20 NOTE — Progress Notes (Signed)
[facility]  [address], [place] [address]  [phone]      Medical Progress Note      NAME: Melinda [name]   DOB:  August 20, 1981  MRM:  [id number]    Date/Time of service: 03/20/2023        Subjective:     Chief Complaint:  Patient was personally seen and examined by me during this time period.  Chart reviewed. Fu abdominal pain. Continues to report abd pain and n/v. Family at bedside.  Lipase repeated and normal, lactate normal.  Procalcitonin normal.  CRP normal.  Continuing aggressive bowel regimen.  Has had good stool output.  Duplex ultrasounds from yesterday were negative.    Dilaudid has now been increased to 1 mg every 3 hours but still without pain control.  Abdomen remains soft, no evidence of acute abdomen.  Repeating CT with contrast.  Reached out to GI for further direction       Objective:       Vitals:       Last 24hrs VS reviewed since prior progress note. Most recent are:    Vitals:    03/20/23 1033   BP: (!) 165/89   Pulse: (!) 105   Resp: 18   Temp: 97.7 F (36.5 C)   SpO2:      SpO2 Readings from Last 6 Encounters:   03/20/23 99%   02/17/23 95%   02/14/23 98%   02/04/23 97%   01/20/23 98%   06/29/22 100%          Intake/Output Summary (Last 24 hours) at 03/20/2023 1120  Last data filed at 03/20/2023 1034  Gross per 24 hour   Intake 370 ml   Output --   Net 370 ml          Exam:     Physical Exam:      Gen:  Well-developed, well-nourished, in no acute distress  HEENT:  Pink conjunctivae, PERRL, hearing intact to voice  Resp:  No accessory muscle use, clear breath sounds without wheezes rales or rhonchi  Card:  tachycardia, No murmurs, normal S1, S2, no peripheral edema  Abd:  abd TTP, non-distended, normoactive bowel sounds are present  Musc:  No cyanosis or clubbing  Skin:  No rashes or ulcers, skin turgor is good  Neuro:  Cranial nerves 3-12 are grossly intact, follows commands appropriately  Psych:  Oriented to person, place, and time, Alert with  good insight      Medications Reviewed: (see below)    Lab Data Reviewed: (see below)    ______________________________________________________________________    Medications:     Current Facility-Administered Medications   Medication Dose Route Frequency    HYDROmorphone HCl PF (DILAUDID) injection 1 mg  1 mg IntraVENous Q3H PRN    famotidine (PEPCID) tablet 20 mg  20 mg Oral BID    bisacodyl (DULCOLAX) EC tablet 5 mg  5 mg Oral Every Other Day    cyclobenzaprine (FLEXERIL) tablet 10 mg  10 mg Oral BID    LORazepam (ATIVAN) tablet 0.5 mg  0.5 mg Oral Q4H PRN    scopolamine (TRANSDERM-SCOP) transdermal patch 1 patch  1 patch TransDERmal Q72H    metroNIDAZOLE (FLAGYL) tablet 500 mg  500 mg Oral 3 times per day    cefTRIAXone (ROCEPHIN) 1,000 mg in sterile water 10 mL IV syringe  1,000 mg IntraVENous Q24H    sucralfate (CARAFATE) tablet 1 g  1 g Oral 4  times per day    glucose chewable tablet 16 g  4 tablet Oral PRN    dextrose bolus 10% 125 mL  125 mL IntraVENous PRN    Or    dextrose bolus 10% 250 mL  250 mL IntraVENous PRN    glucagon injection 1 mg  1 mg SubCUTAneous PRN    dextrose 10 % infusion   IntraVENous Continuous PRN    metoprolol (LOPRESSOR) injection 5 mg  5 mg IntraVENous Q8H    prochlorperazine (COMPAZINE) injection 10 mg  10 mg IntraVENous Q6H PRN    hydrALAZINE (APRESOLINE) injection 10 mg  10 mg IntraVENous Q6H PRN    cloNIDine (CATAPRES) 0.2 MG/24HR 1 patch  1 patch TransDERmal Weekly    sodium chloride flush 0.9 % injection 5-40 mL  5-40 mL IntraVENous 2 times per day    sodium chloride flush 0.9 % injection 5-40 mL  5-40 mL IntraVENous PRN    0.9 % sodium chloride infusion   IntraVENous PRN    potassium chloride (KLOR-CON) extended release tablet 40 mEq  40 mEq Oral PRN    Or    potassium bicarb-citric acid (EFFER-K) effervescent tablet 40 mEq  40 mEq Oral PRN    Or    potassium chloride 10 mEq/100 mL IVPB (Peripheral Line)  10 mEq IntraVENous PRN    magnesium sulfate 2000 mg in 50 mL IVPB premix   2,000 mg IntraVENous PRN    enoxaparin (LOVENOX) injection 40 mg  40 mg SubCUTAneous Daily    ondansetron (ZOFRAN-ODT) disintegrating tablet 4 mg  4 mg Oral Q8H PRN    Or    ondansetron (ZOFRAN) injection 4 mg  4 mg IntraVENous Q6H PRN    polyethylene glycol (GLYCOLAX) packet 17 g  17 g Oral Daily PRN    acetaminophen (TYLENOL) tablet 650 mg  650 mg Oral Q6H PRN    Or    acetaminophen (TYLENOL) suppository 650 mg  650 mg Rectal Q6H PRN    insulin lispro (HUMALOG,ADMELOG) injection vial 0-4 Units  0-4 Units SubCUTAneous TID WC    insulin lispro (HUMALOG,ADMELOG) injection vial 0-4 Units  0-4 Units SubCUTAneous Nightly    amLODIPine (NORVASC) tablet 10 mg  10 mg Oral Daily    metoprolol succinate (TOPROL XL) extended release tablet 50 mg  50 mg Oral Daily    [Held by provider] lisinopril (PRINIVIL;ZESTRIL) tablet 40 mg  40 mg Oral Daily          Lab Review:     Recent Labs     03/19/23  0523 03/20/23  0126   WBC 6.3 6.0   HGB 9.8* 8.7*   HCT 29.5* 26.1*   PLT 421* 304       Recent Labs     03/18/23  1545 03/19/23  0520 03/19/23  0523 03/20/23  0126   NA  --   --  138  --    K  --   --  3.1*  --    CL  --   --  107  --    CO2  --   --  22  --    BUN  --   --  8  --    MG  --   --  1.6  --    PHOS  --   --  3.0  --    ALT 141*  --  150* 98*   INR  --  1.0  --  1.0  No results found for: "GLUCPOC"       Assessment / Plan:     Acute abdominal pain/intractable nausea vomiting POA: Fevered on 6/7, restarted Rocephin, continue Flagyl. Pain possibly due to constipation and /or gastroparesis.  Restive  bowel regimen started, good stools over the past few days. Recent gastric emptying study negative; but patient was on Reglan at that time which may have affected results.  Check stool cultures if any diarrhea.  Lipase, Pro-Cal, lactate, CRP all normal.  Bowel regimen per GI.   IV PPI. Sch IV Reglan. IV antiemetics as needed. Iv dilaudid prn now increased to every 3 hours due to uncontrolled pain.  With worsening pain but  soft abdomen will repeat CT but with contrast, and reach back out to GI.    H. Pylori: Discontinue triple therapy per GI, liver enzymes going back down     Hypertensive urgency/history of hypertension POA: Suspect has been noncompliant with medications due to nausea vomiting.  Amlodipine and as tolerated; hold ACE due to n/v. Change coreg to IV lopressor for now. Added clonidine patch. Iv hydralazine prn      Elevated LFTs/ Alk phos POA: Check CT abdomen pelvis as above.  Neg hepatitis panel.  Monitor.     Hypokalemia/ Hyponatremia POA: Due to IVVD and vomiting.  Replete as needed.  Monitor mag.     Thrombocytosis POA: Likely reactive.  Monitor.     Recent UTI POA: UA no eo of infection.  IV ceftriaxone x 3 days completed. She will  not be able to tolerate oral antibiotics OP due to n/v.     CKD stage III POA: Appears around baseline. Hold ACE for now due to n/v.  Monitor.     Type 2 diabetes POA: Hold home oral agents.  Insulin sliding scale.    Total time spent with patient: 73 Minutes **I personally saw and examined the patient during this time period**                 Care Plan discussed with: Patient and Nursing Staff    Discussed:  Care Plan    Prophylaxis:  Lovenox    Disposition:  Home w/Family           ___________________________________________________    Attending Physician: Reggy Eye, MD

## 2023-03-21 ENCOUNTER — Inpatient Hospital Stay: Admit: 2023-03-21 | Payer: MEDICAID | Primary: Adult Health

## 2023-03-21 LAB — CBC WITH AUTO DIFFERENTIAL
Basophils %: 1 % (ref 0–1)
Basophils Absolute: 0.1 10*3/uL (ref 0.0–0.1)
Eosinophils %: 3 % (ref 0–7)
Eosinophils Absolute: 0.2 10*3/uL (ref 0.0–0.4)
Hematocrit: 25.1 % — ABNORMAL LOW (ref 35.0–47.0)
Hemoglobin: 8.5 g/dL — ABNORMAL LOW (ref 11.5–16.0)
Immature Granulocytes %: 0 % (ref 0.0–0.5)
Immature Granulocytes Absolute: 0 10*3/uL (ref 0.00–0.04)
Lymphocytes %: 33 % (ref 12–49)
Lymphocytes Absolute: 2.2 10*3/uL (ref 0.8–3.5)
MCH: 28.1 PG (ref 26.0–34.0)
MCHC: 33.9 g/dL (ref 30.0–36.5)
MCV: 82.8 FL (ref 80.0–99.0)
MPV: 8.7 FL — ABNORMAL LOW (ref 8.9–12.9)
Monocytes %: 10 % (ref 5–13)
Monocytes Absolute: 0.7 10*3/uL (ref 0.0–1.0)
Neutrophils %: 53 % (ref 32–75)
Neutrophils Absolute: 3.5 10*3/uL (ref 1.8–8.0)
Nucleated RBCs: 0 PER 100 WBC
Platelets: 377 10*3/uL (ref 150–400)
RBC: 3.03 M/uL — ABNORMAL LOW (ref 3.80–5.20)
RDW: 19.2 % — ABNORMAL HIGH (ref 11.5–14.5)
WBC: 6.6 10*3/uL (ref 3.6–11.0)
nRBC: 0 10*3/uL (ref 0.00–0.01)

## 2023-03-21 LAB — COMPREHENSIVE METABOLIC PANEL W/ REFLEX TO MG FOR LOW K
ALT: 71 U/L (ref 12–78)
AST: 32 U/L (ref 15–37)
Albumin/Globulin Ratio: 0.7 — ABNORMAL LOW (ref 1.1–2.2)
Albumin: 2 g/dL — ABNORMAL LOW (ref 3.5–5.0)
Alk Phosphatase: 108 U/L (ref 45–117)
Anion Gap: 4 mmol/L — ABNORMAL LOW (ref 5–15)
BUN/Creatinine Ratio: 7 — ABNORMAL LOW (ref 12–20)
BUN: 8 MG/DL (ref 6–20)
CO2: 26 mmol/L (ref 21–32)
Calcium: 8 MG/DL — ABNORMAL LOW (ref 8.5–10.1)
Chloride: 107 mmol/L (ref 97–108)
Creatinine: 1.2 MG/DL — ABNORMAL HIGH (ref 0.55–1.02)
Est, Glom Filt Rate: 59 mL/min/{1.73_m2} — ABNORMAL LOW (ref >60)
Globulin: 2.7 g/dL (ref 2.0–4.0)
Glucose: 198 mg/dL — ABNORMAL HIGH (ref 65–100)
Potassium: 3.2 mmol/L — ABNORMAL LOW (ref 3.5–5.1)
Sodium: 137 mmol/L (ref 136–145)
Total Bilirubin: 0.2 MG/DL (ref 0.2–1.0)
Total Protein: 4.7 g/dL — ABNORMAL LOW (ref 6.4–8.2)

## 2023-03-21 LAB — EXTRA TUBES HOLD

## 2023-03-21 LAB — LIPASE: Lipase: 21 U/L (ref 13–75)

## 2023-03-21 LAB — POCT GLUCOSE
POC Glucose: 150 mg/dL — ABNORMAL HIGH (ref 65–117)
POC Glucose: 184 mg/dL — ABNORMAL HIGH (ref 65–117)
POC Glucose: 179 mg/dL — ABNORMAL HIGH (ref 65–117)
POC Glucose: 158 mg/dL — ABNORMAL HIGH (ref 65–117)

## 2023-03-21 LAB — PROTIME-INR
INR: 1 (ref 0.9–1.1)
Protime: 10.9 s (ref 9.0–11.1)

## 2023-03-21 LAB — C-REACTIVE PROTEIN: CRP: <0.29 mg/dL (ref 0.00–0.30)

## 2023-03-21 LAB — VAS DUP UPPER EXTREMITY VENOUS BILATERAL: Body Surface Area: 1.6 m2

## 2023-03-21 LAB — PHOSPHORUS: Phosphorus: 2.6 MG/DL (ref 2.6–4.7)

## 2023-03-21 LAB — MAGNESIUM: Magnesium: 1.9 mg/dL (ref 1.6–2.4)

## 2023-03-21 MED ORDER — SENNA-DOCUSATE SODIUM 8.6-50 MG PO TABS
8.6-50 MG | Freq: Two times a day (BID) | ORAL | Status: DC
Start: 2023-03-21 — End: 2023-03-23
  Administered 2023-03-21 – 2023-03-23 (×5): 1 via ORAL

## 2023-03-21 MED ORDER — POTASSIUM CHLORIDE IN NACL 20-0.9 MEQ/L-% IV SOLN
20-0.9- MEQ/L-% | INTRAVENOUS | Status: DC
Start: 2023-03-21 — End: 2023-03-22
  Administered 2023-03-21: 14:00:00 via INTRAVENOUS

## 2023-03-21 MED ORDER — KETOROLAC TROMETHAMINE 30 MG/ML IJ SOLN
30 | Freq: Once | INTRAMUSCULAR | Status: AC
Start: 2023-03-21 — End: 2023-03-21
  Administered 2023-03-21: 19:00:00 15 mg via INTRAVENOUS

## 2023-03-21 MED ORDER — SODIUM CHLORIDE (PF) 0.9 % IJ SOLN
0.9 % | Freq: Two times a day (BID) | INTRAMUSCULAR | Status: DC
Start: 2023-03-21 — End: 2023-03-23
  Administered 2023-03-21 – 2023-03-23 (×5): 20 mg via INTRAVENOUS

## 2023-03-21 MED ORDER — POTASSIUM CHLORIDE ER 10 MEQ PO TBCR
10 | Freq: Once | ORAL | Status: AC
Start: 2023-03-21 — End: 2023-03-21
  Administered 2023-03-21: 14:00:00 20 meq via ORAL

## 2023-03-21 MED ORDER — METOCLOPRAMIDE HCL 5 MG/ML IJ SOLN
5 MG/ML | Freq: Four times a day (QID) | INTRAMUSCULAR | Status: DC
Start: 2023-03-21 — End: 2023-03-23
  Administered 2023-03-21 – 2023-03-23 (×8): 5 mg via INTRAVENOUS

## 2023-03-21 MED ORDER — POLYETHYLENE GLYCOL 3350 17 G PO PACK
17 g | Freq: Two times a day (BID) | ORAL | Status: DC
Start: 2023-03-21 — End: 2023-03-23
  Administered 2023-03-21 – 2023-03-23 (×4): 17 g via ORAL

## 2023-03-21 MED ORDER — BISACODYL 10 MG RE SUPP
10 MG | Freq: Every day | RECTAL | Status: DC | PRN
Start: 2023-03-21 — End: 2023-03-23

## 2023-03-21 MED ORDER — OXYCODONE HCL 5 MG PO TABS
5 MG | ORAL | Status: DC | PRN
Start: 2023-03-21 — End: 2023-03-22
  Administered 2023-03-21 – 2023-03-22 (×4): 5 mg via ORAL

## 2023-03-21 MED FILL — LISINOPRIL 20 MG PO TABS: 20 MG | ORAL | Qty: 2

## 2023-03-21 MED FILL — HYDROMORPHONE HCL 1 MG/ML IJ SOLN: 1 MG/ML | INTRAMUSCULAR | Qty: 1

## 2023-03-21 MED FILL — SUCRALFATE 1 G PO TABS: 1 GM | ORAL | Qty: 1

## 2023-03-21 MED FILL — ENOXAPARIN SODIUM 40 MG/0.4ML IJ SOSY: 40 MG/0.4ML | INTRAMUSCULAR | Qty: 0.4

## 2023-03-21 MED FILL — PROCHLORPERAZINE EDISYLATE 10 MG/2ML IJ SOLN: 10 MG/2ML | INTRAMUSCULAR | Qty: 2

## 2023-03-21 MED FILL — METRONIDAZOLE 250 MG PO TABS: 250 MG | ORAL | Qty: 2

## 2023-03-21 MED FILL — CYCLOBENZAPRINE HCL 10 MG PO TABS: 10 MG | ORAL | Qty: 1

## 2023-03-21 MED FILL — METOCLOPRAMIDE HCL 5 MG/ML IJ SOLN: 5 MG/ML | INTRAMUSCULAR | Qty: 2

## 2023-03-21 MED FILL — POTASSIUM CHLORIDE IN NACL 20-0.9 MEQ/L-% IV SOLN: INTRAVENOUS | Qty: 1000

## 2023-03-21 MED FILL — AMLODIPINE BESYLATE 5 MG PO TABS: 5 MG | ORAL | Qty: 2

## 2023-03-21 MED FILL — POTASSIUM CHLORIDE ER 10 MEQ PO TBCR: 10 MEQ | ORAL | Qty: 2

## 2023-03-21 MED FILL — ONDANSETRON HCL 4 MG/2ML IJ SOLN: 4 MG/2ML | INTRAMUSCULAR | Qty: 2

## 2023-03-21 MED FILL — OXYCODONE HCL 5 MG PO TABS: 5 MG | ORAL | Qty: 1

## 2023-03-21 MED FILL — METOPROLOL TARTRATE 5 MG/5ML IV SOLN: 5 MG/ML | INTRAVENOUS | Qty: 5

## 2023-03-21 MED FILL — METOPROLOL SUCCINATE ER 50 MG PO TB24: 50 MG | ORAL | Qty: 2

## 2023-03-21 MED FILL — BISACODYL EC 5 MG PO TBEC: 5 MG | ORAL | Qty: 1

## 2023-03-21 MED FILL — FAMOTIDINE (PF) 20 MG/2ML IV SOLN: 20 MG/2ML | INTRAVENOUS | Qty: 2

## 2023-03-21 MED FILL — STOOL SOFTENER/LAXATIVE 50-8.6 MG PO TABS: ORAL | Qty: 1

## 2023-03-21 MED FILL — KETOROLAC TROMETHAMINE 30 MG/ML IJ SOLN: 30 MG/ML | INTRAMUSCULAR | Qty: 1

## 2023-03-21 MED FILL — LORAZEPAM 0.5 MG PO TABS: 0.5 MG | ORAL | Qty: 1

## 2023-03-21 MED FILL — POLYETHYLENE GLYCOL 3350 17 G PO PACK: 17 g | ORAL | Qty: 1

## 2023-03-21 NOTE — Consults (Signed)
Session ID: [id number]  Language: Spanish  Interpreter ID: #[id number]  Interpreter Name: Glean Salen

## 2023-03-21 NOTE — Consults (Signed)
Session ID: [id number]  Language: Spanish  Interpreter ID: [id number]  Interpreter Name: Lars Mage

## 2023-03-21 NOTE — Progress Notes (Signed)
[facility]  [address] [place] [address] [place], [place] [address]  [phone]        Hospitalist Progress Note      NAME: Melinda [name]   DOB:  04/27/1981  MRM:  [id number]    Date/Time of service: 03/21/2023  1:56 PM       Subjective:     Chief Complaint:  Patient was personally seen and examined by me during this time period.  Chart reviewed.  Still with abd pain, some N/V       Objective:       Vitals:       Last 24hrs VS reviewed since prior progress note. Most recent are:    Vitals:    03/21/23 1153   BP: 137/85   Pulse: 78   Resp: 16   Temp: 98.6 F (37 C)   SpO2: 96%     SpO2 Readings from Last 6 Encounters:   03/21/23 96%   02/17/23 95%   02/14/23 98%   02/04/23 97%   01/20/23 98%   06/29/22 100%          Intake/Output Summary (Last 24 hours) at 03/21/2023 1356  Last data filed at 03/21/2023 0930  Gross per 24 hour   Intake 975 ml   Output --   Net 975 ml        Exam:     Physical Exam:    Gen:  Well-developed, well-nourished, obese, mild distress  HEENT:  Pink conjunctivae, PERRL, hearing intact to voice, moist mucous membranes  Neck:  Supple, without masses, thyroid non-tender  Resp:  No accessory muscle use, clear breath sounds without wheezes rales or rhonchi  Card:  No murmurs, normal S1, S2 without thrills, bruits or peripheral edema  Abd:  Soft, mild epigastric pain, non-distended, normoactive bowel sounds are present  Musc:  No cyanosis or clubbing  Skin:  No rashes   Neuro:  moves all ext, follows commands  Psych:  poor insight, oriented to person, place and time, alert.  Spanish speaking    Medications Reviewed: (see below)    Lab Data Reviewed: (see below)    ______________________________________________________________________    Medications:     Current Facility-Administered Medications   Medication Dose Route Frequency    0.9% NaCl with KCl 20 mEq infusion   IntraVENous Continuous    famotidine (PEPCID) 20 mg in sodium chloride (PF) 0.9 % 10 mL injection  20 mg  IntraVENous BID    oxyCODONE (ROXICODONE) immediate release tablet 5 mg  5 mg Oral Q4H PRN    sennosides-docusate sodium (SENOKOT-S) 8.6-50 MG tablet 1 tablet  1 tablet Oral BID    polyethylene glycol (GLYCOLAX) packet 17 g  17 g Oral BID    bisacodyl (DULCOLAX) suppository 10 mg  10 mg Rectal Daily PRN    metoclopramide (REGLAN) injection 5 mg  5 mg IntraVENous Q6H    HYDROmorphone HCl PF (DILAUDID) injection 1 mg  1 mg IntraVENous Q3H PRN    metoprolol succinate (TOPROL XL) extended release tablet 100 mg  100 mg Oral Daily    cyclobenzaprine (FLEXERIL) tablet 10 mg  10 mg Oral BID    LORazepam (ATIVAN) tablet 0.5 mg  0.5 mg Oral Q4H PRN    scopolamine (TRANSDERM-SCOP) transdermal patch 1 patch  1 patch TransDERmal Q72H    sucralfate (CARAFATE) tablet 1 g  1 g Oral 4 times per day    glucose chewable tablet 16 g  4 tablet  Oral PRN    dextrose bolus 10% 125 mL  125 mL IntraVENous PRN    Or    dextrose bolus 10% 250 mL  250 mL IntraVENous PRN    glucagon injection 1 mg  1 mg SubCUTAneous PRN    dextrose 10 % infusion   IntraVENous Continuous PRN    prochlorperazine (COMPAZINE) injection 10 mg  10 mg IntraVENous Q6H PRN    hydrALAZINE (APRESOLINE) injection 10 mg  10 mg IntraVENous Q6H PRN    cloNIDine (CATAPRES) 0.2 MG/24HR 1 patch  1 patch TransDERmal Weekly    sodium chloride flush 0.9 % injection 5-40 mL  5-40 mL IntraVENous 2 times per day    sodium chloride flush 0.9 % injection 5-40 mL  5-40 mL IntraVENous PRN    0.9 % sodium chloride infusion   IntraVENous PRN    enoxaparin (LOVENOX) injection 40 mg  40 mg SubCUTAneous Daily    ondansetron (ZOFRAN-ODT) disintegrating tablet 4 mg  4 mg Oral Q8H PRN    Or    ondansetron (ZOFRAN) injection 4 mg  4 mg IntraVENous Q6H PRN    polyethylene glycol (GLYCOLAX) packet 17 g  17 g Oral Daily PRN    acetaminophen (TYLENOL) tablet 650 mg  650 mg Oral Q6H PRN    Or    acetaminophen (TYLENOL) suppository 650 mg  650 mg Rectal Q6H PRN    insulin lispro (HUMALOG,ADMELOG)  injection vial 0-4 Units  0-4 Units SubCUTAneous TID WC    insulin lispro (HUMALOG,ADMELOG) injection vial 0-4 Units  0-4 Units SubCUTAneous Nightly    amLODIPine (NORVASC) tablet 10 mg  10 mg Oral Daily    lisinopril (PRINIVIL;ZESTRIL) tablet 40 mg  40 mg Oral Daily          Lab Review:     Recent Labs     03/19/23  0523 03/20/23  0126 03/21/23  0300   WBC 6.3 6.0 6.6   HGB 9.8* 8.7* 8.5*   HCT 29.5* 26.1* 25.1*   PLT 421* 304 377     Recent Labs     03/18/23  1545 03/19/23  0520 03/19/23  0523 03/20/23  0126 03/20/23  1139 03/20/23  1155 03/21/23  0300   NA   < >  --  138  --  139 139 137   K   < >  --  3.1*  --  3.6 3.4* 3.2*   CL   < >  --  107  --  108 108 107   CO2   < >  --  22  --  26 26 26    BUN   < >  --  8  --  10 9 8    MG   < >  --  1.6  --  1.7 1.6 1.9   PHOS  --   --  3.0  --  3.2  --  2.6   ALT  --   --  150* 98*  --  86* 71   INR  --  1.0  --  1.0  --   --  1.0    < > = values in this interval not displayed.     No results found for: "GLUCPOC"       Assessment / Plan:     42 yo hx of HTN, DM, CKD 3, lap chole, presented w/ intractable abd pain, N/V, elevated LFTs    1) Intractable abd pain/N/V: still active.  Unclear etiology.  Suspect constipation w/ gastroparesis.  Extensive w/u, including abd CT, gastric emptying, EGD, colonoscopy, capsule, have been unrevealing.  Will cont IV reglan, bowel regimen.  Use IV dilaudid prn severe pain.  Will obtain head CT to r/o central causes of N/V.  GI following.  Monitor closely    2) Hx of H. Pylori: s/p triple therapy    3) Elevated LFTs: unclear etiology.  Already had extensive w/u.  Hepatitis panel neg.  Will monitor     4) Malignant HTN: cont norvasc, metoprolol, clonidine, lisinopril.  Use IV hydralazine prn    5) Anemia: check iron studies.  Monitor Hgb     6) DM type 2: A1C 8.5%.  cont SSI    7) CKD 3: monitor BMP    8) Recent UTI: s/p IV CTX    9) HypoK/Mg/phos: replete IV    **Prior records, notes, labs, radiology, and medications reviewed in [facility]**    Total time spent with patient care: 45 Minutes **I personally saw and examined the patient during this time period**                 Care Plan discussed with: Patient, nursing    Discussed:  Care Plan    Prophylaxis:  Lovenox    Disposition:  HH PT, OT, RN           ___________________________________________________    Attending Physician: Paulo Fruit, MD

## 2023-03-21 NOTE — Progress Notes (Signed)
Pharmacy Dosing Services: 03/21/23    Pharmacist Renal Dosing  Note for Reglan   Physician Dr. Drucilla Schmidt    Indication:   Previous Regimen 10mg  q6h   Serum Creatinine Lab Results   Component Value Date/Time    CREATININE 1.20 03/21/2023 03:00 AM      Creatinine Clearance Estimated Creatinine Clearance: 53 mL/min (A) (based on SCr of 1.2 mg/dL (H)).   BUN Lab Results   Component Value Date/Time    BUN 8 03/21/2023 03:00 AM         Plan:  adjust to 5mg  q6h (Crcl <59)    Melinda [name], PharmD, BCPS

## 2023-03-21 NOTE — Progress Notes (Signed)
Melinda Leever, PA-C                       [phone] office             Monday-Friday 8:00 am-4:30 pm  I am not permitted to use "perfect serve" use above for contact, thanks.        Gastroenterology Progress Note    March 21, 2023  Admit Date: 03/16/2023         Narrative Assessment and Plan   42 year old female with past medical history of type 2 diabetes on insulin, hypertension, hypercholesterolemia, admitted 03/16/2023 with abdominal pain, nausea, vomiting.       Suspected gastroparesis on Reglan, which she takes once daily at home. Gastric emptying studies have been normal here and at [facility]. She was on Reglan for the study here.      Recent H pylori infection diagnosed on EGD pathology, treated with bismuth, Flagyl, doxycycline therapy prescribed 02/14/23. She reports she has been taking this 4x daily as prescribed and had two days left.      CT abdomen pelvis from 02/17/2023 does demonstrate some fecal stasis and increased stool burden.      Patient is very sleepy today and reports pain in her back and neck when asked where her pain is. When questioned if she has abdominal pain she does endorse this but is unable to describe what it feels like. Repeat CT over the weekend with no acute intraabdominal abnormality, decreased colonic stool burden on review with Dr. Brent Bulla, LFTs and lipase within normal limits today. WBC within normal limits, BP 137/85. HR 78, temp 98.83F.      Impression:  Abdominal pain  Nausea and vomiting   Back and neck pain     Plan:  Extensive workup of this patient's vague generalized abdominal pain, nausea/vomiting, and anemia, including EGD, colonoscopy, video capsule endoscopy, CT imaging, and gastric emptying study, have not revealed an acute cause of her symptoms.   Nausea and vomiting are decreased today, she had several bowel movements with the bowel prep over the weekend.    Initiate bowel regimen with Miralax twice daily and dulcolax 10mg  nightly 3 times per week MWF.   Restarting reglan has improved N/V, can continue for the time being.   Follow up as outpatient to test for H. Pylori eradication off all PPIs for 2 weeks.   Continue Pepcid 20 mg BID to help with dyspepsia symptoms.   Symptomatic management until patient is stable to discharge home with OP GI follow up.   Patient very sleepy today. Consider weaning narcotics if possible, consider CT head if she remains sleepy and continues to have N/V.     Inpatient GI will sign off, please re-consult if needed further.     [name], PA-C    Subjective:   Chief Complaint: Abdominal Pain, Nausea, and Vomiting     History of Present Illness: Melinda [name] is a 42 year old female with past medical history of type 2 diabetes (on Novolin 70-30 insulin 15 units twice daily, last A1c 8.5 on 02/10/23), hypertension, and hypercholesterolemia, admitted 03/16/23 with abdominal pain, nausea, and vomiting.  She takes Reglan once per day at home, and reports that she is compliant with eating small, mainly pured meals.  She reports her symptoms get better for several days at a time, then worse again.  She has a bowel movement every other day to every couple of days at home.  LFTs elevated this admission. No new medications, no alcohol or drug use. S/p cholecystectomy.      03/17/23 : Interpreter Alonna Minium [phone].Patient with some improvement in N/V overnight.    03/18/23:  Interpreter Doree Fudge #[id number]. Patient with worsening pain last night and mild fever T 100.3. Some improvement in N/V.     03/21/23:  Interpreter [place] ID # [phone]. Patient reports improvement in N/V today. When asked where her pain is, she indicates it is in her back and neck. When questioned about abdominal pain, she says she has pain in her abdomen also.       Several recent admissions for the same:      07/08/21 admitted at [facility] with abd pain, nausea, vomiting. Assessed  by GI with concern for gastroparesis, diet modifications recommended.      01/14/23-01/20/23 admitted at [facility] with abd pain, nausea and vomiting and melena. EGD 01/17/23 with retained food in stomach, repeat EGD 01/18/23 unremarkable. Colonoscopy 01/19/23 with small internal hemorrhoids, Pillcam administered 01/19/23.      02/01/23-02/04/23 admitted at [facility] with abd pain, nausea, and vomiting. Abnormal HIDA scan, patient underwent cholecystectomy.      02/10/23-02/18/23 admitted at [facility] with abdominal pain. Fluid in gallbladder fossa drained by IR, culture negative.  Started on bismuth, Flagyl, doxycycline, PPI x 14 days on discharge.     02/19/23-02/20/23 admitted at [facility] for the same.  No clear etiology found at that time.     03/11/2023 to 03/14/2023 admitted to [facility] for epigastric abdominal pain, nausea, and vomiting.  Met sepsis criteria for UTI this admission, treated with IV Rocephin.  Gastric emptying study 03/14/2023 normal.  CT repeated this admission, reported as negative.          ROS:  The previous review of systems on initial consultation / H&P is noted and reviewed.  Specific changes noted above in HPI.    Current Medications:     Current Facility-Administered Medications   Medication Dose Route Frequency    0.9% NaCl with KCl 20 mEq infusion   IntraVENous Continuous    famotidine (PEPCID) 20 mg in sodium chloride (PF) 0.9 % 10 mL injection  20 mg IntraVENous BID    oxyCODONE (ROXICODONE) immediate release tablet 5 mg  5 mg Oral Q4H PRN    sennosides-docusate sodium (SENOKOT-S) 8.6-50 MG tablet 1 tablet  1 tablet Oral BID    polyethylene glycol (GLYCOLAX) packet 17 g  17 g Oral BID    bisacodyl (DULCOLAX) suppository 10 mg  10 mg Rectal Daily PRN    metoclopramide (REGLAN) injection 5 mg  5 mg IntraVENous Q6H    HYDROmorphone HCl PF (DILAUDID) injection 1 mg  1 mg IntraVENous Q3H PRN    metoprolol succinate (TOPROL XL) extended release tablet 100 mg  100 mg  Oral Daily    cyclobenzaprine (FLEXERIL) tablet 10 mg  10 mg Oral BID    LORazepam (ATIVAN) tablet 0.5 mg  0.5 mg Oral Q4H PRN    scopolamine (TRANSDERM-SCOP) transdermal patch 1 patch  1 patch TransDERmal Q72H    cefTRIAXone (ROCEPHIN) 1,000 mg in sterile water 10 mL IV syringe  1,000 mg IntraVENous Q24H    sucralfate (CARAFATE) tablet 1 g  1 g Oral 4 times per day    glucose chewable tablet 16 g  4 tablet Oral PRN    dextrose bolus 10% 125 mL  125 mL IntraVENous PRN    Or    dextrose bolus  10% 250 mL  250 mL IntraVENous PRN    glucagon injection 1 mg  1 mg SubCUTAneous PRN    dextrose 10 % infusion   IntraVENous Continuous PRN    prochlorperazine (COMPAZINE) injection 10 mg  10 mg IntraVENous Q6H PRN    hydrALAZINE (APRESOLINE) injection 10 mg  10 mg IntraVENous Q6H PRN    cloNIDine (CATAPRES) 0.2 MG/24HR 1 patch  1 patch TransDERmal Weekly    sodium chloride flush 0.9 % injection 5-40 mL  5-40 mL IntraVENous 2 times per day    sodium chloride flush 0.9 % injection 5-40 mL  5-40 mL IntraVENous PRN    0.9 % sodium chloride infusion   IntraVENous PRN    enoxaparin (LOVENOX) injection 40 mg  40 mg SubCUTAneous Daily    ondansetron (ZOFRAN-ODT) disintegrating tablet 4 mg  4 mg Oral Q8H PRN    Or    ondansetron (ZOFRAN) injection 4 mg  4 mg IntraVENous Q6H PRN    polyethylene glycol (GLYCOLAX) packet 17 g  17 g Oral Daily PRN    acetaminophen (TYLENOL) tablet 650 mg  650 mg Oral Q6H PRN    Or    acetaminophen (TYLENOL) suppository 650 mg  650 mg Rectal Q6H PRN    insulin lispro (HUMALOG,ADMELOG) injection vial 0-4 Units  0-4 Units SubCUTAneous TID WC    insulin lispro (HUMALOG,ADMELOG) injection vial 0-4 Units  0-4 Units SubCUTAneous Nightly    amLODIPine (NORVASC) tablet 10 mg  10 mg Oral Daily    lisinopril (PRINIVIL;ZESTRIL) tablet 40 mg  40 mg Oral Daily       Objective:     VITALS:   Last 24hrs VS reviewed since prior progress note. Most recent are:  Vitals:    03/21/23 1153   BP: 137/85   Pulse: 78   Resp: 16    Temp: 98.6 F (37 C)   SpO2: 96%     Temp (24hrs), Avg:98.3 F (36.8 C), Min:97.6 F (36.4 C), Max:99.3 F (37.4 C)      Intake/Output Summary (Last 24 hours) at 03/21/2023 1203  Last data filed at 03/21/2023 0930  Gross per 24 hour   Intake 1455 ml   Output --   Net 1455 ml         EXAM:  General:  Comfortable, no distress    HEENT:  Atraumatic skull, pupils equal  Lungs:   No cough or increased work of breathing.   Speaking in complete sentences  Abd:    Bowel sounds active. Reports generalized tenderness with    Palpation but unable to describe quality of the pain.   Musc:   No skeletal defects or deformities  Neurologic:   Cranial nerves grossly intact, moves all 4 extremities  Psych:    Good insight. Not anxious nor agitated  Derm:   No rash, jaundice, nor palpable dermatologic mass    Lab Data Reviewed:   Recent Labs     03/19/23  0523 03/20/23  0126 03/21/23  0300   WBC 6.3 6.0 6.6   HGB 9.8* 8.7* 8.5*   HCT 29.5* 26.1* 25.1*   PLT 421* 304 377     Recent Labs     03/18/23  1545 03/19/23  0520 03/19/23  0523 03/20/23  0126 03/20/23  1139 03/20/23  1155 03/21/23  0300   NA   < >  --  138  --  139 139 137   K   < >  --  3.1*  --  3.6 3.4* 3.2*   CL   < >  --  107  --  108 108 107   CO2   < >  --  22  --  26 26 26    BUN   < >  --  8  --  10 9 8    MG   < >  --  1.6  --  1.7 1.6 1.9   PHOS  --   --  3.0  --  3.2  --  2.6   ALT  --   --  150* 98*  --  86* 71   INR  --  1.0  --  1.0  --   --  1.0    < > = values in this interval not displayed.     No results found for: "GLUCPOC"  No results for input(s): "PH", "PCO2", "PO2", "HCO3", "FIO2" in the last 72 hours.  Recent Labs     03/19/23  0520 03/20/23  0126 03/21/23  0300   INR 1.0 1.0 1.0           Assessment:   (See above)  Principal Problem:    Intractable nausea and vomiting  Resolved Problems:    * No resolved hospital problems. *      Plan:   (See above)      Signed By: [name], PA-C     03/21/2023  12:03 PM

## 2023-03-21 NOTE — Consults (Signed)
Session ID: [id number]  Language: Spanish  Interpreter ID: #[id number]  Interpreter Name: Kyla Balzarine

## 2023-03-21 NOTE — Progress Notes (Signed)
15:30 Patient up to bathroom frequently with complaints of abdominal discomfort. Placed specihat in commode for nurse assessment of urine and stool, educated patient who has a language barrier during communication. Use of interpreter (daughter age 41 at bedside), patient is confused about date, place and requires frequent direction, noted short term memory after patient was educated two times to patient to leave speci hat in commode for assessment. Nurse tech assisted patient in bathroom. Patient voided 200 ml yellow urine, smear of brown stool. Bladder scanned following void and noted 271 ml post void residual. Reported to Dr. Rozell Searing Do who ordered foley catheter. Foley catheter placed.   [place],RN

## 2023-03-21 NOTE — Consults (Signed)
Session ID: [id number]  Language: Spanish  Interpreter ID: #[id number]  Interpreter Name: Myrtis Ser

## 2023-03-21 NOTE — Care Coordination-Inpatient (Signed)
CM Note:  GI following--pt with severe epigastric abdominal pain  No CM needs identified  When medically stable pt to d/c home transported by family    Shella Maxim, RN  03/21/2023  11:36 AM

## 2023-03-21 NOTE — Consults (Signed)
Session ID: [id number]  Language: Spanish  Interpreter ID: #[id number]  Interpreter Name: Elease Hashimoto

## 2023-03-21 NOTE — Consults (Signed)
Session ID: [id number]  Language: Spanish  Interpreter ID: #[id number]  Interpreter Name: [place]

## 2023-03-22 LAB — POCT GLUCOSE
POC Glucose: 176 mg/dL — ABNORMAL HIGH (ref 65–117)
POC Glucose: 166 mg/dL — ABNORMAL HIGH (ref 65–117)
POC Glucose: 195 mg/dL — ABNORMAL HIGH (ref 65–117)
POC Glucose: 197 mg/dL — ABNORMAL HIGH (ref 65–117)
POC Glucose: 254 mg/dL — ABNORMAL HIGH (ref 65–117)

## 2023-03-22 LAB — IRON AND TIBC
Iron % Saturation: 18 % — ABNORMAL LOW (ref 20–50)
Iron: 27 ug/dL — ABNORMAL LOW (ref 35–150)
TIBC: 152 ug/dL — ABNORMAL LOW (ref 250–450)

## 2023-03-22 LAB — CULTURE, BLOOD 1: Culture: NO GROWTH

## 2023-03-22 LAB — CBC
Hematocrit: 26 % — ABNORMAL LOW (ref 35.0–47.0)
Hemoglobin: 8.8 g/dL — ABNORMAL LOW (ref 11.5–16.0)
MCH: 28 PG (ref 26.0–34.0)
MCHC: 33.8 g/dL (ref 30.0–36.5)
MCV: 82.8 FL (ref 80.0–99.0)
MPV: 8.7 FL — ABNORMAL LOW (ref 8.9–12.9)
Nucleated RBCs: 0 PER 100 WBC
Platelets: 379 10*3/uL (ref 150–400)
RBC: 3.14 M/uL — ABNORMAL LOW (ref 3.80–5.20)
RDW: 18.6 % — ABNORMAL HIGH (ref 11.5–14.5)
WBC: 6.1 10*3/uL (ref 3.6–11.0)
nRBC: 0 10*3/uL (ref 0.00–0.01)

## 2023-03-22 LAB — BASIC METABOLIC PANEL
Anion Gap: 9 mmol/L (ref 5–15)
BUN/Creatinine Ratio: 6 — ABNORMAL LOW (ref 12–20)
BUN: 7 MG/DL (ref 6–20)
CO2: 22 mmol/L (ref 21–32)
Calcium: 8.2 MG/DL — ABNORMAL LOW (ref 8.5–10.1)
Chloride: 107 mmol/L (ref 97–108)
Creatinine: 1.21 MG/DL — ABNORMAL HIGH (ref 0.55–1.02)
Est, Glom Filt Rate: 58 mL/min/{1.73_m2} — ABNORMAL LOW (ref >60)
Glucose: 214 mg/dL — ABNORMAL HIGH (ref 65–100)
Potassium: 3.9 mmol/L (ref 3.5–5.1)
Sodium: 138 mmol/L (ref 136–145)

## 2023-03-22 LAB — PHOSPHORUS: Phosphorus: 3.5 MG/DL (ref 2.6–4.7)

## 2023-03-22 LAB — MAGNESIUM: Magnesium: 1.7 mg/dL (ref 1.6–2.4)

## 2023-03-22 LAB — FERRITIN: Ferritin: 81 NG/ML (ref 26–388)

## 2023-03-22 MED ORDER — LACTATED RINGERS IV SOLN
INTRAVENOUS | Status: DC
Start: 2023-03-22 — End: 2023-03-23
  Administered 2023-03-22 (×2): via INTRAVENOUS

## 2023-03-22 MED ORDER — ALUM & MAG HYDROXIDE-SIMETH 200-200-20 MG/5ML PO SUSP
200-200-20 | Freq: Once | ORAL | Status: AC
Start: 2023-03-22 — End: 2023-03-22
  Administered 2023-03-22: 06:00:00 40 mL via ORAL

## 2023-03-22 MED ORDER — INSULIN LISPRO 100 UNIT/ML IJ SOLN
100 | Freq: Every evening | INTRAMUSCULAR | Status: DC
Start: 2023-03-22 — End: 2023-03-23

## 2023-03-22 MED ORDER — HYDROMORPHONE HCL PF 1 MG/ML IJ SOLN
1 | INTRAMUSCULAR | Status: DC | PRN
Start: 2023-03-22 — End: 2023-03-23

## 2023-03-22 MED ORDER — OXYCODONE HCL 5 MG PO TABS
5 | ORAL | Status: DC | PRN
Start: 2023-03-22 — End: 2023-03-23

## 2023-03-22 MED ORDER — INSULIN LISPRO 100 UNIT/ML IJ SOLN
100 | Freq: Three times a day (TID) | INTRAMUSCULAR | Status: DC
Start: 2023-03-22 — End: 2023-03-23
  Administered 2023-03-22: 21:00:00 4 [IU] via SUBCUTANEOUS
  Administered 2023-03-23: 12:00:00 2 [IU] via SUBCUTANEOUS

## 2023-03-22 MED ORDER — IRON SUCROSE 20 MG/ML IV SOLN
20 | Freq: Once | INTRAVENOUS | Status: AC
Start: 2023-03-22 — End: 2023-03-22
  Administered 2023-03-22: 16:00:00 200 mg via INTRAVENOUS

## 2023-03-22 MED FILL — SUCRALFATE 1 G PO TABS: 1 GM | ORAL | Qty: 1

## 2023-03-22 MED FILL — FAMOTIDINE (PF) 20 MG/2ML IV SOLN: 20 MG/2ML | INTRAVENOUS | Qty: 2

## 2023-03-22 MED FILL — OXYCODONE HCL 5 MG PO TABS: 5 MG | ORAL | Qty: 1

## 2023-03-22 MED FILL — STOOL SOFTENER/LAXATIVE 50-8.6 MG PO TABS: ORAL | Qty: 1

## 2023-03-22 MED FILL — METOCLOPRAMIDE HCL 5 MG/ML IJ SOLN: 5 MG/ML | INTRAMUSCULAR | Qty: 2

## 2023-03-22 MED FILL — LISINOPRIL 20 MG PO TABS: 20 MG | ORAL | Qty: 2

## 2023-03-22 MED FILL — METOPROLOL SUCCINATE ER 50 MG PO TB24: 50 MG | ORAL | Qty: 2

## 2023-03-22 MED FILL — HYDRALAZINE HCL 20 MG/ML IJ SOLN: 20 MG/ML | INTRAMUSCULAR | Qty: 1

## 2023-03-22 MED FILL — LACTATED RINGERS IV SOLN: INTRAVENOUS | Qty: 1000

## 2023-03-22 MED FILL — AMLODIPINE BESYLATE 5 MG PO TABS: 5 MG | ORAL | Qty: 2

## 2023-03-22 MED FILL — MAG-AL PLUS 200-200-20 MG/5ML PO LIQD: 200-200-20 MG/5ML | ORAL | Qty: 30

## 2023-03-22 MED FILL — POLYETHYLENE GLYCOL 3350 17 G PO PACK: 17 g | ORAL | Qty: 1

## 2023-03-22 MED FILL — VENOFER 20 MG/ML IV SOLN: 20 MG/ML | INTRAVENOUS | Qty: 10

## 2023-03-22 MED FILL — SCOPOLAMINE 1 MG/3DAYS TD PT72: 1 MG/3DAYS | TRANSDERMAL | Qty: 1

## 2023-03-22 MED FILL — CYCLOBENZAPRINE HCL 10 MG PO TABS: 10 MG | ORAL | Qty: 1

## 2023-03-22 MED FILL — ENOXAPARIN SODIUM 40 MG/0.4ML IJ SOSY: 40 MG/0.4ML | INTRAMUSCULAR | Qty: 0.4

## 2023-03-22 MED FILL — PROCHLORPERAZINE EDISYLATE 10 MG/2ML IJ SOLN: 10 MG/2ML | INTRAMUSCULAR | Qty: 2

## 2023-03-22 MED FILL — INSULIN LISPRO 100 UNIT/ML IJ SOLN: 100 UNIT/ML | INTRAMUSCULAR | Qty: 4

## 2023-03-22 NOTE — Progress Notes (Signed)
Eucharistic ministry visit.  [name] was in bed and her daughter was with her. Prayer and communion offered. She kept dozing off and expressed her gratitude but not ready for engaging in conversation.  Assured of continued prayer.    Chaplain Sr. Verdene Lennert, SBS, RN, ACSW, LCSW  Chaplain Page:  [phone])

## 2023-03-22 NOTE — Progress Notes (Signed)
60 RN spoke with pt regarding concerns prior to and during hospital admission. RN utilized interpreter services to assist in translation. Pt states she is concerned about her cardiac and respiratory health, stating she has a history of pre-eclampsia and is worried this is still something she is dealing with currently. Pt describes having a feeling of being picked up and dropped, as well as sensations in her chest, but denies chest pain and shortness of breath. Pt describes this as intermittent, and exacerbated by ambulation and stress. RN notified attending via perfectserve.     During conversation, RN noticed pt had hung foley catheter bag to IV pole. RN educated pt on risks of placing foley bag above level of the bladder. RN educated pt on proper placement of foley bag at edge of her bed per facility protocol. Pt verbalizes understanding via interpreter translation. RN allowed time for questions during this conversation following questions being answered. Pt denies further questions. Plan of care being followed accordingly.

## 2023-03-22 NOTE — Consults (Signed)
New Urology Consult Note    Patient: Melinda [name] MRN: [id number]  SSN: [id number]    Date of Birth: Feb 11, 1981  Age: 42 y.o.  Sex: female            Assessment:     Melinda [name] is a 42 y.o. female with hx of HTN. DM II, CKD 3.  Admitted for intractable abdominal pain, N/V.  Neurology has been consulted for evaluation of urinary retention,?  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  Patient is not known to [facility].    Interpreter services were used during interview.  On assessment in nad, patient reports bilateral lower back and flank pain. Reports urinary frequency, urgency and urge incontinence   Patient denies difficulty voiding, dysuria, hematuria, suprapubic pain or pressure, split urinary stream.  Foley cath in place draining clear yellow urine.     VSS, afebrile  No leukocytosis, creatinine- 1.21  UA not c/w UTI, Ucx w/o growth  CT abd/pel: no hydro, no stones, bladder mildly distended in imaging       Recommendations:     Urinary Retention  ? Urethral duplication  1 pvr of 250, difficult to classify as urinary retention. Reported flank pain, seems to be MSK. If flank pain continues, reasonable to reimage, assess for stones.  - reasonable to maintain FC for 2-3 days and VT. Management per primary team  - OP FU, for further assessment of possible urethral duplication. Patient prefers to call and schedule. Of note difficult to definitively identify urethral duplication on examination, though exam was limited.     Urology will sign-off, please call with questions         Thank you for this consult. Please contact [facility] with any further questions/concerns.               Subjective     Past Medical History  Past Medical History:   Diagnosis Date    High blood pressure     High cholesterol     Type 2 diabetes mellitus (HCC)     Controled       Past Surgical History:   Past Surgical History:   Procedure Laterality Date    CAPSULE ENDOSCOPY N/A  01/20/2023    ESOPHAGEAL CAPSULE ENDOSCOPY remove at 1624PM performed by Glyn Ade, MD at [facility] ENDOSCOPY    CHOLECYSTECTOMY, LAPAROSCOPIC N/A 02/03/2023    ROBOTIC LAPAROSCOPIC CHOLECYSTECTOMY with Indocyanine green performed by Verdis Frederickson, MD at [facility] MAIN OR    COLONOSCOPY N/A 01/19/2023    COLONOSCOPY DIAGNOSTIC performed by Glyn Ade, MD at [facility] ENDOSCOPY    OTHER SURGICAL HISTORY Left     Rentia attachment    TUBAL LIGATION Bilateral     UPPER GASTROINTESTINAL ENDOSCOPY N/A 01/17/2023    ESOPHAGOGASTRODUODENOSCOPY performed by Glyn Ade, MD at [facility] ENDOSCOPY    UPPER GASTROINTESTINAL ENDOSCOPY N/A 01/17/2023    ESOPHAGOGASTRODUODENOSCOPY BIOPSY performed by Glyn Ade, MD at [facility] ENDOSCOPY    UPPER GASTROINTESTINAL ENDOSCOPY N/A 01/18/2023    ESOPHAGOGASTRODUODENOSCOPY performed by Glyn Ade, MD at [facility] ENDOSCOPY    [place] ABSCESS DRAINAGE PERITONEAL  02/11/2023    [place] ABSCESS DRAINAGE PERITONEAL 02/11/2023 [facility] RAD [place]       Medication:  Current Facility-Administered Medications   Medication Dose Route Frequency Provider Last Rate Last Admin    insulin lispro (HUMALOG,ADMELOG) injection vial 0-8 Units  0-8 Units SubCUTAneous TID WC Do, Khoi B, MD        insulin lispro (HUMALOG,ADMELOG) injection  vial 0-4 Units  0-4 Units SubCUTAneous Nightly Do, Khoi B, MD        lactated ringers IV soln infusion   IntraVENous Continuous Do, Khoi B, MD 50 mL/hr at 03/22/23 0813 New Bag at 03/22/23 0813    famotidine (PEPCID) 20 mg in sodium chloride (PF) 0.9 % 10 mL injection  20 mg IntraVENous BID Do, Khoi B, MD   20 mg at 03/22/23 0805    oxyCODONE (ROXICODONE) immediate release tablet 5 mg  5 mg Oral Q4H PRN Do, Khoi B, MD   5 mg at 03/22/23 0933    sennosides-docusate sodium (SENOKOT-S) 8.6-50 MG tablet 1 tablet  1 tablet Oral BID Do, Khoi B, MD   1 tablet at 03/22/23 0804    polyethylene glycol (GLYCOLAX) packet 17 g  17 g Oral BID Do, Khoi B, MD   17 g at 03/21/23 2221    bisacodyl (DULCOLAX) suppository 10 mg  10 mg  Rectal Daily PRN Do, Khoi B, MD        metoclopramide (REGLAN) injection 5 mg  5 mg IntraVENous Q6H Do, Khoi B, MD   5 mg at 03/22/23 0641    [Held by provider] HYDROmorphone HCl PF (DILAUDID) injection 1 mg  1 mg IntraVENous Q3H PRN Lajean Saver, MD   1 mg at 03/21/23 0930    metoprolol succinate (TOPROL XL) extended release tablet 100 mg  100 mg Oral Daily Lajean Saver, MD   100 mg at 03/22/23 0804    cyclobenzaprine (FLEXERIL) tablet 10 mg  10 mg Oral BID Lajean Saver, MD   10 mg at 03/22/23 0804    LORazepam (ATIVAN) tablet 0.5 mg  0.5 mg Oral Q4H PRN Lequita Halt H, DO   0.5 mg at 03/21/23 1810    scopolamine (TRANSDERM-SCOP) transdermal patch 1 patch  1 patch TransDERmal Q72H Lequita Halt H, DO   1 patch at 03/19/23 2148    sucralfate (CARAFATE) tablet 1 g  1 g Oral 4 times per day Lajean Saver, MD   1 g at 03/22/23 0641    glucose chewable tablet 16 g  4 tablet Oral PRN Lajean Saver, MD        dextrose bolus 10% 125 mL  125 mL IntraVENous PRN Muncy, Madelaine Bhat, MD        Or    dextrose bolus 10% 250 mL  250 mL IntraVENous PRN Muncy, Madelaine Bhat, MD        glucagon injection 1 mg  1 mg SubCUTAneous PRN Muncy, Madelaine Bhat, MD        dextrose 10 % infusion   IntraVENous Continuous PRN Muncy, Madelaine Bhat, MD        prochlorperazine (COMPAZINE) injection 10 mg  10 mg IntraVENous Q6H PRN Horald Chestnut, DO   10 mg at 03/22/23 0535    hydrALAZINE (APRESOLINE) injection 10 mg  10 mg IntraVENous Q6H PRN Horald Chestnut, DO   10 mg at 03/22/23 0017    cloNIDine (CATAPRES) 0.2 MG/24HR 1 patch  1 patch TransDERmal Weekly Horald Chestnut, DO   1 patch at 03/17/23 0859    sodium chloride flush 0.9 % injection 5-40 mL  5-40 mL IntraVENous 2 times per day Horald Chestnut, DO   10 mL at 03/22/23 0819    sodium chloride flush 0.9 % injection 5-40 mL  5-40 mL IntraVENous PRN Horald Chestnut, DO   10 mL at 03/21/23 1524    0.9 % sodium chloride  infusion   IntraVENous PRN Horald Chestnut, DO        enoxaparin (LOVENOX) injection 40 mg  40 mg SubCUTAneous Daily Horald Chestnut, DO   40 mg at 03/22/23 0804    ondansetron (ZOFRAN-ODT) disintegrating tablet 4 mg  4 mg Oral Q8H PRN Horald Chestnut, DO   4 mg at 03/19/23 1522    Or    ondansetron (ZOFRAN) injection 4 mg  4 mg IntraVENous Q6H PRN Horald Chestnut, DO   4 mg at 03/21/23 0551    polyethylene glycol (GLYCOLAX) packet 17 g  17 g Oral Daily PRN Horald Chestnut, DO        acetaminophen (TYLENOL) tablet 650 mg  650 mg Oral Q6H PRN Horald Chestnut, DO   650 mg at 03/19/23 5784    Or    acetaminophen (TYLENOL) suppository 650 mg  650 mg Rectal Q6H PRN Horald Chestnut, DO        amLODIPine (NORVASC) tablet 10 mg  10 mg Oral Daily Horald Chestnut, DO   10 mg at 03/22/23 0804    lisinopril (PRINIVIL;ZESTRIL) tablet 40 mg  40 mg Oral Daily Horald Chestnut, DO   40 mg at 03/22/23 6962       Allergies:  No Known Allergies    Social History:  Social History     Tobacco Use    Smoking status: Never    Smokeless tobacco: Never   Vaping Use    Vaping Use: Never used   Substance Use Topics    Alcohol use: Never    Drug use: Never       Family History  History reviewed. No pertinent family history.    Review of Systems  ROS from attending provider note from 03/22/23  reviewed and changes (other than per HPI) include : none.     Objective:     Vital signs in last 24 hours:  BP (!) 141/78   Pulse 96   Temp 97.6 F (36.4 C) (Oral)   Resp 17   Ht 1.499 m (4\' 11" )   Wt 61.2 kg (135 lb)   LMP 03/16/2023 (Exact Date)   SpO2 97%   BMI 27.27 kg/m       Physical Exam  General: NAD, A&O,   HEENT: lids normal, no tracheal deviation, no lesions or deformities  Pulmonary: Normal work of breathing   Abdomen: soft, NTTP, nondistended, no suprapubic fullness or tenderness  GU: bilateral lower back and flank pain. no CVA tenderness  Gait: not observed  Neuro: A&O x 2, confused  Mood/Affect: normal    Lab/Imaging Review:       Most Recent Labs:  Lab Results   Component Value Date/Time    WBC 6.1  03/22/2023 02:29 AM    HGB 8.8 03/22/2023 02:29 AM    HCT 26.0 03/22/2023 02:29 AM    PLT 379 03/22/2023 02:29 AM    MCV 82.8 03/22/2023 02:29 AM        Lab Results   Component Value Date/Time    NA 138 03/22/2023 02:29 AM    K 3.9 03/22/2023 02:29 AM    CL 107 03/22/2023 02:29 AM    CO2 22 03/22/2023 02:29 AM    BUN 7 03/22/2023 02:29 AM    GFRAA >60 07/10/2021 12:01 AM    GLOB 2.7 03/21/2023 03:00 AM    ALT 71 03/21/2023 03:00 AM        No results found for: "PSA", "PSA2", "PSAR1", "PSA1", "PSA3", "[id number]", "PSAEXT"  No results found for: "HBA1C", "HBA1CPOC"     No results found for: "CPK", "CKMB", "BNP"       Urine/Blood Cultures:  Results       Procedure Component Value Units Date/Time    Culture, Blood 2 [[id number]] Collected: 03/18/23 1614    Order Status: Completed Specimen: Blood Updated: 03/22/23 0554     Special Requests --        NO SPECIAL REQUESTS  LEFT  FOREARM       Culture NO GROWTH 4 DAYS       Culture, Blood 1 [[id number]] Collected: 03/18/23 1545    Order Status: Completed Specimen: Blood Updated: 03/22/23 0554     Special Requests --        NO SPECIAL REQUESTS  LEFT  Antecubital       Culture NO GROWTH 4 DAYS       Culture, Blood 1 [[id number]] Collected: 03/16/23 0816    Order Status: Completed Specimen: Blood Updated: 03/22/23 0554     Special Requests --        NO SPECIAL REQUESTS  RIGHT  Antecubital       Culture NO GROWTH 6 DAYS       Culture, Blood 2 [[id number]] Collected: 03/16/23 0815    Order Status: Canceled Specimen: Blood                IMAGING:    INDICATION: Abdominal pain, nausea     COMPARISON: 02/17/2023     TECHNIQUE:   Thin axial images were obtained through the abdomen and pelvis without  intravenous iodinated contrast administration. Coronal and sagittal  reconstructions were generated. Oral contrast was not administered. CT dose  reduction was achieved through use of a standardized protocol tailored for this  examination and automatic exposure control for dose  modulation.      FINDINGS: Lack of IV contrast limits evaluation of the solid abdominal organs.     LIVER: No mass or biliary dilatation.   GALLBLADDER: Absent  SPLEEN: Unremarkable  PANCREAS: No mass or ductal dilatation.  ADRENALS: Unremarkable.  KIDNEYS/URETERS: No abnormal mass. No hydronephrosis or renal calculus.  PERITONEUM: No abdominal lymphadenopathy or ascites.  COLON: No dilatation or wall thickening.  APPENDIX: Unremarkable.  SMALL BOWEL: No dilatation or wall thickening.  STOMACH: Unremarkable.  PELVIS: No pelvic lymphadenopathy or free fluid.  BONES: No destructive bone lesion.   VISUALIZED THORAX: Distal esophagitis  ADDITIONAL COMMENTS: N/A     IMPRESSION:        1. No evidence for acute abdominal abnormality.  2. Distal esophagitis.     Electronically signed by Enid Baas           Specimen Collected: 03/17/23 18:57 EDT Last Resulted: 03/17/23 19:01 EDT                   Signed By: Freddy Jaksch, FNP  - March 22, 2023

## 2023-03-22 NOTE — Plan of Care (Signed)
Problem: Discharge Planning  Goal: Discharge to home or other facility with appropriate resources  03/22/2023 2244 by Ting Cage, Almon Hercules, RN  Outcome: Progressing  Flowsheets (Taken 03/22/2023 2015)  Discharge to home or other facility with appropriate resources: Identify barriers to discharge with patient and caregiver  03/22/2023 0857 by Isaias Sakai, RN  Outcome: Not Progressing  Flowsheets (Taken 03/22/2023 0800)  Discharge to home or other facility with appropriate resources:   Identify barriers to discharge with patient and caregiver   Arrange for needed discharge resources and transportation as appropriate     Problem: Pain  Goal: Verbalizes/displays adequate comfort level or baseline comfort level  03/22/2023 2244 by Paulita Licklider, Almon Hercules, RN  Outcome: Progressing  Flowsheets (Taken 03/22/2023 1120 by Isaias Sakai, RN)  Verbalizes/displays adequate comfort level or baseline comfort level:   Encourage patient to monitor pain and request assistance   Assess pain using appropriate pain scale  03/22/2023 0857 by Isaias Sakai, RN  Outcome: Not Progressing  Flowsheets (Taken 03/22/2023 0800)  Verbalizes/displays adequate comfort level or baseline comfort level:   Encourage patient to monitor pain and request assistance   Assess pain using appropriate pain scale     Problem: Chronic Conditions and Co-morbidities  Goal: Patient's chronic conditions and co-morbidity symptoms are monitored and maintained or improved  03/22/2023 2244 by Tyger Oka, Almon Hercules, RN  Outcome: Progressing  Flowsheets (Taken 03/22/2023 2015)  Care Plan - Patient's Chronic Conditions and Co-Morbidity Symptoms are Monitored and Maintained or Improved: Monitor and assess patient's chronic conditions and comorbid symptoms for stability, deterioration, or improvement  03/22/2023 0857 by Isaias Sakai, RN  Outcome: Not Progressing  Flowsheets (Taken 03/22/2023 0800)  Care Plan - Patient's Chronic Conditions and  Co-Morbidity Symptoms are Monitored and Maintained or Improved:   Monitor and assess patient's chronic conditions and comorbid symptoms for stability, deterioration, or improvement   Collaborate with multidisciplinary team to address chronic and comorbid conditions and prevent exacerbation or deterioration     Problem: Safety - Adult  Goal: Free from fall injury  03/22/2023 2244 by Demian Maisel, Almon Hercules, RN  Outcome: Progressing  03/22/2023 0857 by Isaias Sakai, RN  Outcome: Progressing     Problem: Skin/Tissue Integrity  Goal: Absence of new skin breakdown  Description: 1.  Monitor for areas of redness and/or skin breakdown  2.  Assess vascular access sites hourly  3.  Every 4-6 hours minimum:  Change oxygen saturation probe site  4.  Every 4-6 hours:  If on nasal continuous positive airway pressure, respiratory therapy assess nares and determine need for appliance change or resting period.  03/22/2023 2244 by Grant Swager, Almon Hercules, RN  Outcome: Progressing  03/22/2023 0857 by Isaias Sakai, RN  Outcome: Progressing     Problem: Discharge Planning  Goal: Discharge to home or other facility with appropriate resources  03/22/2023 2244 by Marlen Mollica, Almon Hercules, RN  Outcome: Progressing  Flowsheets (Taken 03/22/2023 2015)  Discharge to home or other facility with appropriate resources: Identify barriers to discharge with patient and caregiver  03/22/2023 0857 by Isaias Sakai, RN  Outcome: Not Progressing  Flowsheets (Taken 03/22/2023 0800)  Discharge to home or other facility with appropriate resources:   Identify barriers to discharge with patient and caregiver   Arrange for needed discharge resources and transportation as appropriate     Problem: Pain  Goal: Verbalizes/displays adequate comfort level or baseline comfort level  03/22/2023 2244 by Karee Forge, Almon Hercules, RN  Outcome: Progressing  Flowsheets (Taken 03/22/2023 1120 by Isaias Sakai, RN)  Verbalizes/displays adequate comfort  level or baseline comfort level:   Encourage patient to monitor pain and request assistance   Assess pain using appropriate pain scale  03/22/2023 0857 by Isaias Sakai, RN  Outcome: Not Progressing  Flowsheets (Taken 03/22/2023 0800)  Verbalizes/displays adequate comfort level or baseline comfort level:   Encourage patient to monitor pain and request assistance   Assess pain using appropriate pain scale     Problem: Chronic Conditions and Co-morbidities  Goal: Patient's chronic conditions and co-morbidity symptoms are monitored and maintained or improved  03/22/2023 2244 by Marilyne Haseley, Almon Hercules, RN  Outcome: Progressing  Flowsheets (Taken 03/22/2023 2015)  Care Plan - Patient's Chronic Conditions and Co-Morbidity Symptoms are Monitored and Maintained or Improved: Monitor and assess patient's chronic conditions and comorbid symptoms for stability, deterioration, or improvement  03/22/2023 0857 by Isaias Sakai, RN  Outcome: Not Progressing  Flowsheets (Taken 03/22/2023 0800)  Care Plan - Patient's Chronic Conditions and Co-Morbidity Symptoms are Monitored and Maintained or Improved:   Monitor and assess patient's chronic conditions and comorbid symptoms for stability, deterioration, or improvement   Collaborate with multidisciplinary team to address chronic and comorbid conditions and prevent exacerbation or deterioration

## 2023-03-22 NOTE — Consults (Signed)
Session ID: [id number]  Language: Spanish  Interpreter ID: #[id number]  Interpreter Name: Candelaria Celeste

## 2023-03-22 NOTE — Progress Notes (Signed)
[facility]  [address] [place], [place] [address]  [phone]        Hospitalist Progress Note      NAME: Melinda [name]   DOB:  21-Oct-1980  MRM:  [id number]    Date/Time of service: 03/22/2023  11:02 AM       Subjective:     Chief Complaint:  Patient was personally seen and examined by me during this time period.  Chart reviewed.  Has epigastric pain.  Less vomiting       Objective:       Vitals:       Last 24hrs VS reviewed since prior progress note. Most recent are:    Vitals:    03/22/23 1045   BP:    Pulse: 93   Resp:    Temp:    SpO2:      SpO2 Readings from Last 6 Encounters:   03/22/23 96%   02/17/23 95%   02/14/23 98%   02/04/23 97%   01/20/23 98%   06/29/22 100%          Intake/Output Summary (Last 24 hours) at 03/22/2023 1102  Last data filed at 03/22/2023 1045  Gross per 24 hour   Intake 1365.83 ml   Output 950 ml   Net 415.83 ml          Exam:     Physical Exam:    Gen:  Well-developed, well-nourished, obese, mild distress  HEENT:  Pink conjunctivae, PERRL, hearing intact to voice, moist mucous membranes  Neck:  Supple, without masses, thyroid non-tender  Resp:  No accessory muscle use, clear breath sounds without wheezes rales or rhonchi  Card:  No murmurs, normal S1, S2 without thrills, bruits or peripheral edema  Abd:  Soft, + epigastric pain, non-distended, normoactive bowel sounds are present  Musc:  No cyanosis or clubbing  Skin:  No rashes   Neuro:  moves all ext, follows commands  Psych:  poor insight, oriented to person, place and time, alert.  Spanish speaking    Medications Reviewed: (see below)    Lab Data Reviewed: (see below)    ______________________________________________________________________    Medications:     Current Facility-Administered Medications   Medication Dose Route Frequency    insulin lispro (HUMALOG,ADMELOG) injection vial 0-8 Units  0-8 Units SubCUTAneous TID WC    insulin lispro (HUMALOG,ADMELOG) injection vial 0-4 Units  0-4  Units SubCUTAneous Nightly    lactated ringers IV soln infusion   IntraVENous Continuous    oxyCODONE (ROXICODONE) immediate release tablet 10 mg  10 mg Oral Q4H PRN    HYDROmorphone HCl PF (DILAUDID) injection 1 mg  1 mg IntraVENous Q4H PRN    famotidine (PEPCID) 20 mg in sodium chloride (PF) 0.9 % 10 mL injection  20 mg IntraVENous BID    sennosides-docusate sodium (SENOKOT-S) 8.6-50 MG tablet 1 tablet  1 tablet Oral BID    polyethylene glycol (GLYCOLAX) packet 17 g  17 g Oral BID    bisacodyl (DULCOLAX) suppository 10 mg  10 mg Rectal Daily PRN    metoclopramide (REGLAN) injection 5 mg  5 mg IntraVENous Q6H    metoprolol succinate (TOPROL XL) extended release tablet 100 mg  100 mg Oral Daily    cyclobenzaprine (FLEXERIL) tablet 10 mg  10 mg Oral BID    LORazepam (ATIVAN) tablet 0.5 mg  0.5 mg Oral Q4H PRN    scopolamine (TRANSDERM-SCOP) transdermal patch 1 patch  1 patch  TransDERmal Q72H    sucralfate (CARAFATE) tablet 1 g  1 g Oral 4 times per day    glucose chewable tablet 16 g  4 tablet Oral PRN    dextrose bolus 10% 125 mL  125 mL IntraVENous PRN    Or    dextrose bolus 10% 250 mL  250 mL IntraVENous PRN    glucagon injection 1 mg  1 mg SubCUTAneous PRN    dextrose 10 % infusion   IntraVENous Continuous PRN    prochlorperazine (COMPAZINE) injection 10 mg  10 mg IntraVENous Q6H PRN    hydrALAZINE (APRESOLINE) injection 10 mg  10 mg IntraVENous Q6H PRN    cloNIDine (CATAPRES) 0.2 MG/24HR 1 patch  1 patch TransDERmal Weekly    sodium chloride flush 0.9 % injection 5-40 mL  5-40 mL IntraVENous 2 times per day    sodium chloride flush 0.9 % injection 5-40 mL  5-40 mL IntraVENous PRN    0.9 % sodium chloride infusion   IntraVENous PRN    enoxaparin (LOVENOX) injection 40 mg  40 mg SubCUTAneous Daily    ondansetron (ZOFRAN-ODT) disintegrating tablet 4 mg  4 mg Oral Q8H PRN    Or    ondansetron (ZOFRAN) injection 4 mg  4 mg IntraVENous Q6H PRN    polyethylene glycol (GLYCOLAX) packet 17 g  17 g Oral Daily PRN     acetaminophen (TYLENOL) tablet 650 mg  650 mg Oral Q6H PRN    Or    acetaminophen (TYLENOL) suppository 650 mg  650 mg Rectal Q6H PRN    amLODIPine (NORVASC) tablet 10 mg  10 mg Oral Daily    lisinopril (PRINIVIL;ZESTRIL) tablet 40 mg  40 mg Oral Daily          Lab Review:     Recent Labs     03/20/23  0126 03/21/23  0300 03/22/23  0229   WBC 6.0 6.6 6.1   HGB 8.7* 8.5* 8.8*   HCT 26.1* 25.1* 26.0*   PLT 304 377 379       Recent Labs     03/20/23  0126 03/20/23  1139 03/20/23  1139 03/20/23  1155 03/21/23  0300 03/22/23  0229   NA  --  139   < > 139 137 138   K  --  3.6   < > 3.4* 3.2* 3.9   CL  --  108   < > 108 107 107   CO2  --  26   < > 26 26 22    BUN  --  10   < > 9 8 7    MG  --  1.7   < > 1.6 1.9 1.7   PHOS  --  3.2  --   --  2.6 3.5   ALT 98*  --   --  86* 71  --    INR 1.0  --   --   --  1.0  --     < > = values in this interval not displayed.       No results found for: "GLUCPOC"       Assessment / Plan:     42 yo hx of HTN, DM, CKD 3, lap chole, presented w/ intractable abd pain, N/V, elevated LFTs    1) Intractable abd pain/N/V: slow to improve.  Unclear etiology.  Suspect constipation w/ gastroparesis.  Extensive w/u, including abd CT, head CT, gastric emptying, EGD, colonoscopy, capsule, have been unrevealing.  Will cont  IV reglan, bowel regimen.  Use IV dilaudid prn severe pain.  GI following.  Monitor closely    2) Hx of H. Pylori: s/p triple therapy    3) Elevated LFTs: unclear etiology.  Already had extensive w/u.  Hepatitis panel neg.  Will monitor     4) Malignant HTN: BP better.  Cont norvasc, metoprolol, clonidine, lisinopril.  Use IV hydralazine prn    5) Anemia: has some iron def.  Will give IV iron.  Monitor Hgb     6) DM type 2: A1C 8.5%.  cont SSI    7) CKD 3: monitor BMP    8) Recent UTI: s/p IV CTX    9) Acute urine retention: foley placed on 06/10.  Will consult Urology    10) HypoK/Mg/phos: Cont to replete IV    **Prior records, notes, labs, radiology, and medications reviewed in  [facility]**    Total time spent with patient care: 35 Minutes **I personally saw and examined the patient during this time period**                 Care Plan discussed with: Patient, nursing    Discussed:  Care Plan    Prophylaxis:  Lovenox    Disposition:  HH PT, OT, RN once symptoms improve            ___________________________________________________    Attending Physician: Paulo Fruit, MD

## 2023-03-22 NOTE — Plan of Care (Signed)
Problem: Discharge Planning  Goal: Discharge to home or other facility with appropriate resources  Outcome: Progressing     Problem: Pain  Goal: Verbalizes/displays adequate comfort level or baseline comfort level  Outcome: Progressing     Problem: Chronic Conditions and Co-morbidities  Goal: Patient's chronic conditions and co-morbidity symptoms are monitored and maintained or improved  Outcome: Progressing     Problem: Safety - Adult  Goal: Free from fall injury  Outcome: Progressing     Problem: Skin/Tissue Integrity  Goal: Absence of new skin breakdown  Description: 1.  Monitor for areas of redness and/or skin breakdown  2.  Assess vascular access sites hourly  3.  Every 4-6 hours minimum:  Change oxygen saturation probe site  4.  Every 4-6 hours:  If on nasal continuous positive airway pressure, respiratory therapy assess nares and determine need for appliance change or resting period.  Outcome: Progressing

## 2023-03-22 NOTE — Plan of Care (Signed)
Problem: Discharge Planning  Goal: Discharge to home or other facility with appropriate resources  03/22/2023 0857 by Isaias Sakai, RN  Outcome: Not Progressing  Flowsheets (Taken 03/22/2023 0800)  Discharge to home or other facility with appropriate resources:   Identify barriers to discharge with patient and caregiver   Arrange for needed discharge resources and transportation as appropriate  03/22/2023 0601 by Erling Conte, RN  Outcome: Progressing     Problem: Pain  Goal: Verbalizes/displays adequate comfort level or baseline comfort level  03/22/2023 0857 by Isaias Sakai, RN  Outcome: Not Progressing  Flowsheets (Taken 03/22/2023 0800)  Verbalizes/displays adequate comfort level or baseline comfort level:   Encourage patient to monitor pain and request assistance   Assess pain using appropriate pain scale  03/22/2023 0601 by Erling Conte, RN  Outcome: Progressing     Problem: Chronic Conditions and Co-morbidities  Goal: Patient's chronic conditions and co-morbidity symptoms are monitored and maintained or improved  03/22/2023 0857 by Isaias Sakai, RN  Outcome: Not Progressing  Flowsheets (Taken 03/22/2023 0800)  Care Plan - Patient's Chronic Conditions and Co-Morbidity Symptoms are Monitored and Maintained or Improved:   Monitor and assess patient's chronic conditions and comorbid symptoms for stability, deterioration, or improvement   Collaborate with multidisciplinary team to address chronic and comorbid conditions and prevent exacerbation or deterioration  03/22/2023 0601 by Erling Conte, RN  Outcome: Progressing

## 2023-03-22 NOTE — Care Coordination-Inpatient (Signed)
Care Management Progress Note        Reason for Admission:   Hypokalemia [E87.6]  Hypertensive urgency [I16.0]  Intractable nausea and vomiting [R11.2]  Intractable epigastric abdominal pain [R10.13]  Nausea and vomiting, unspecified vomiting type [R11.2]         Patient Admission Status: Inpatient  RUR: Readmission Risk Score: 25.2      Hospitalization in the last 30 days (Readmission):  Yes        Transition of care plan:  Patient was discussed in IDR, patient continues to be medically managed. GI is following and urology has been consulted-new foley placement d/t retention.     Dispo: return home. Patient is a pass on the egress. No CM needs anticipated, please consult CM if needs arise.    Outpatient follow-up.    Pt's family to transport.          ___________________________________________   Grier Rocher, RN Case Manager  03/22/2023   12:57 PM

## 2023-03-22 NOTE — Consults (Signed)
Session ID: [id number]  Language: Spanish  Interpreter ID: #[id number]  Interpreter Name: Madaline Guthrie

## 2023-03-23 LAB — POCT GLUCOSE
POC Glucose: 124 mg/dL — ABNORMAL HIGH (ref 65–117)
POC Glucose: 231 mg/dL — ABNORMAL HIGH (ref 65–117)

## 2023-03-23 MED ORDER — SENNA-DOCUSATE SODIUM 8.6-50 MG PO TABS
8.6-50 MG | ORAL_TABLET | Freq: Two times a day (BID) | ORAL | 2 refills | Status: AC
Start: 2023-03-23 — End: 2023-05-14

## 2023-03-23 MED ORDER — LISINOPRIL 40 MG PO TABS
40 MG | ORAL_TABLET | Freq: Every day | ORAL | 2 refills | Status: DC
Start: 2023-03-23 — End: 2024-02-23

## 2023-03-23 MED ORDER — OXYCODONE HCL 10 MG PO TABS
10 MG | ORAL_TABLET | Freq: Four times a day (QID) | ORAL | 0 refills | Status: AC | PRN
Start: 2023-03-23 — End: 2023-03-28

## 2023-03-23 MED ORDER — POLYETHYLENE GLYCOL 3350 17 G PO PACK
17 | PACK | Freq: Two times a day (BID) | ORAL | 2 refills | 27.00 days | Status: AC
Start: 2023-03-23 — End: 2023-05-14

## 2023-03-23 MED ORDER — PANTOPRAZOLE SODIUM 40 MG PO TBEC
40 | ORAL_TABLET | Freq: Two times a day (BID) | ORAL | 1 refills | 90.00 days | Status: AC
Start: 2023-03-23 — End: 2023-05-29

## 2023-03-23 MED ORDER — METOPROLOL SUCCINATE ER 50 MG PO TB24
50 | ORAL_TABLET | Freq: Every day | ORAL | 2 refills | 30.00000 days | Status: DC
Start: 2023-03-23 — End: 2024-07-02

## 2023-03-23 MED ORDER — ONDANSETRON 4 MG PO TBDP
4 MG | ORAL_TABLET | Freq: Three times a day (TID) | ORAL | 0 refills | Status: AC | PRN
Start: 2023-03-23 — End: 2023-05-28

## 2023-03-23 MED ORDER — METOCLOPRAMIDE HCL 5 MG PO TABS
5 | ORAL_TABLET | Freq: Three times a day (TID) | ORAL | 1 refills | 28.00 days | Status: AC
Start: 2023-03-23 — End: 2023-05-29

## 2023-03-23 MED ORDER — AMLODIPINE BESYLATE 10 MG PO TABS
10 | ORAL_TABLET | Freq: Every day | ORAL | 2 refills | Status: DC
Start: 2023-03-23 — End: 2024-08-28

## 2023-03-23 MED FILL — CLONIDINE 0.2 MG/24HR TD PTWK: 0.2 MG/24HR | TRANSDERMAL | Qty: 1

## 2023-03-23 MED FILL — CYCLOBENZAPRINE HCL 10 MG PO TABS: 10 MG | ORAL | Qty: 1

## 2023-03-23 MED FILL — SCOPOLAMINE 1 MG/3DAYS TD PT72: 1 MG/3DAYS | TRANSDERMAL | Qty: 1

## 2023-03-23 MED FILL — HYDRALAZINE HCL 20 MG/ML IJ SOLN: 20 MG/ML | INTRAMUSCULAR | Qty: 1

## 2023-03-23 MED FILL — METOCLOPRAMIDE HCL 5 MG/ML IJ SOLN: 5 MG/ML | INTRAMUSCULAR | Qty: 2

## 2023-03-23 MED FILL — FAMOTIDINE (PF) 20 MG/2ML IV SOLN: 20 MG/2ML | INTRAVENOUS | Qty: 2

## 2023-03-23 MED FILL — METOPROLOL SUCCINATE ER 50 MG PO TB24: 50 MG | ORAL | Qty: 2

## 2023-03-23 MED FILL — INSULIN LISPRO 100 UNIT/ML IJ SOLN: 100 UNIT/ML | INTRAMUSCULAR | Qty: 2

## 2023-03-23 MED FILL — SUCRALFATE 1 G PO TABS: 1 GM | ORAL | Qty: 1

## 2023-03-23 MED FILL — ENOXAPARIN SODIUM 40 MG/0.4ML IJ SOSY: 40 MG/0.4ML | INTRAMUSCULAR | Qty: 0.4

## 2023-03-23 MED FILL — POLYETHYLENE GLYCOL 3350 17 G PO PACK: 17 g | ORAL | Qty: 1

## 2023-03-23 MED FILL — STOOL SOFTENER/LAXATIVE 50-8.6 MG PO TABS: ORAL | Qty: 1

## 2023-03-23 MED FILL — AMLODIPINE BESYLATE 5 MG PO TABS: 5 MG | ORAL | Qty: 2

## 2023-03-23 MED FILL — LISINOPRIL 20 MG PO TABS: 20 MG | ORAL | Qty: 2

## 2023-03-23 NOTE — Discharge Instructions (Signed)
HOSPITALIST DISCHARGE INSTRUCTIONS          NAME: Melinda [name]   DOB:  05-18-1981   MRN:  [id number]     Date/Time:  03/23/2023 9:37 AM    ADMIT DATE: 03/16/2023     DISCHARGE DATE: 03/23/2023     ADMITTING DIAGNOSIS:  Gastroparesis    DISCHARGE DIAGNOSIS:  same    MEDICATIONS:  See after visit summary       It is important that you take the medication exactly as they are prescribed.   Keep your medication in the bottles provided by the pharmacist and keep a list of the medication names, dosages, and times to be taken in your wallet.   Melinda [name] not take other medications without consulting your doctor     Pain Management: per above medications    What to Melinda [name] at Home    Recommended diet:  low fat, low cholesterol diet    Recommended activity: activity as tolerated    1) Return to the hospital if you feel worse    2) If you experience any of the following symptoms then please call your primary care physician or return to the emergency room if you cannot get hold of your doctor:  Fever, chills, nausea, vomiting, diarrhea, change in mentation, falling, bleeding, shortness of breath, chest pain, severe headache, severe abdominal pain,     3) Follow up with your doctors as directed    Follow Up:  [facility]  [address]  [place] [address]  [phone]  Schedule an appointment as soon as possible for a visit in 1 month(s)  Call to schedule appointment    Orlean Bradford, APRN - NP  [address]  [place] [address]  [phone]    Schedule an appointment as soon as possible for a visit in 1 week(s)      Glyn Ade, MD  [address]  [place] [address]    Schedule an appointment as soon as possible for a visit in 2 week(s)    .      Information obtained by :  I understand that if any problems occur once I am at home I am to contact my physician.    I understand and acknowledge receipt of the instructions indicated above.                                                                                                                                            Physician's or R.N.'s Signature  Date/Time                                                                                                                                              Patient or Representative Signature                                                          Date/Time         Gastroparesia: Instrucciones de cuidado  Gastroparesis: Care Instructions  Generalidades     Cuando una persona tiene gastroparesia, su estmago tarda mucho ms en vaciarse. Esta demora puede causar dolor abdominal, abotagamiento y eructos. Tambin puede provocar hipo, acidez estomacal, nuseas o vmito. Es posible que no tenga ganas de comer. Estos sntomas podran aparecer y Geneticist, molecular. Se presentan con ms frecuencia durante y despus de las comidas. Usted puede sentirse satisfecho despus de comer apenas unos pocos bocados de comida.  Esta afeccin se produce cuando los nervios o los msculos del estmago no funcionan correctamente. La diabetes es una de las causas ms comunes de este dao nervioso. La gastroparesia puede causar dificultades para controlar los niveles de Bank[employer]. Pero [employer] niveles de azcar en la sangre bajo control puede ayudar con los sntomas. La enfermedad de Parkinson, un ataque cerebral y algunos medicamentos tambin pueden provocar esta afeccin.  Con frecuencia, el tratamiento en el hogar puede ayudar.  La atencin de seguimiento es una parte clave de su tratamiento y seguridad. Asegrese de Radio producer y acudir a todas las citas, y llame a su mdico si est teniendo problemas. Tambin es una buena idea saber los resultados de sus exmenes y Pharmacologist una lista de los medicamentos que toma.  Cmo puede cuidarse en el hogar?  Coma pequeas cantidades de comida varias veces al da, en lugar de comer tres comidas  grandes.  Coma alimentos con bajo contenido de [place]  Si el mdico se lo sugiere, tome medicamentos que ayuden al [employer] a vaciarse ms rpidamente. Estos se llaman agentes de motilidad.  Cundo debe pedir ayuda?   Llame a su mdico ahora mismo o busque atencin mdica inmediata si:    Tiene vmito.     Tiene dolor abdominal nuevo o ms intenso.     Tiene fiebre.     No puede evacuar heces ni gases.   Preste especial atencin a los cambios en su salud y asegrese de comunicarse con su mdico si tiene algn problema.  Dnde puede encontrar ms informacin?  Vaya a [email or web address] e escriba M106 para ms informacin sobre "Gastroparesia: Instrucciones de cuidado."  Revisado: 19 octubre, 2023  Versin del contenido: 14.1   2006-2024 [employer]wise, Incorporated.   Las  instrucciones de cuidado fueron adaptadas bajo licencia por [employer]. Si usted tiene preguntas sobre una afeccin mdica o sobre estas instrucciones, siempre pregunte a su profesional de salud. Healthwise, Incorporated niega toda garanta o responsabilidad por su uso de esta informacin.         Retencin urinaria: Instrucciones de cuidado  Urinary Retention: Care Instructions  Instrucciones de cuidado     La retencin urinaria significa que no es capaz de Geographical information systems officer. Suele estar causada por una obstruccin de las vas urinarias debida al agrandamiento de la prstata. Tambin puede estar causada por una infeccin, daos nerviosos o estreimiento. O puede ser un efecto secundario de un medicamento.  Es posible que el mdico le haya drenado la orina de la vejiga. Si sigue teniendo problemas para Geographical information systems officer, es posible que tenga que utilizar una sonda en casa. Se utiliza para vaciar la vejiga hasta que se pueda solucionar el problema. Es posible que el mdico le coloque una sonda en la vejiga antes de irse a casa. Si es as, se le dir cundo Tax adviser para que le retiren la sonda.  El mdico lo ha examinado  minuciosamente. Pero pueden desarrollarse problemas ms tarde. Si nota algn problema o nuevos sntomas, busque tratamiento mdico de inmediato.  La atencin de seguimiento es una parte clave de su tratamiento y seguridad. Asegrese de Radio producer y acudir a todas las citas, y llame a su mdico si est teniendo problemas. Tambin es una buena idea saber los resultados de sus exmenes y Pharmacologist una lista de los medicamentos que toma.  Cmo puede cuidarse en el hogar?  Tome sus medicamentos exactamente como le fueron recetados. Llame a su mdico si piensa que est teniendo un problema con su medicamento. Recibir ms detalles sobre los medicamentos especficos recetados por su mdico.  Consulte con su mdico antes de Chemical engineer cualquier medicamento de [employer]. Por ejemplo, muchos medicamentos para el resfriado y las alergias pueden empeorar este problema. Asegrese de que su mdico sepa todos los medicamentos, las vitaminas, los suplementos y los remedios herbarios que toma.  Beba lquidos en pequeas cantidades a lo largo del da. No beba mucho a la hora de [employer].  Evite el alcohol y la cafena.  Si le han dado una sonda, o si ya tiene una sonda en su lugar, siga las instrucciones que le dieron. Lvese siempre las manos antes y despus de tocar la sonda.  Cundo debe pedir ayuda?   Llame a su mdico ahora mismo o busque atencin mdica inmediata si:    No puede orinar en absoluto, o cada vez le cuesta ms orinar.     Tiene una sonda pero la orina no sale.     Tiene sntomas de una infeccin urinaria. Estos pueden incluir:  Dolor o ardor al [employer].  Necesidad de Geographical information systems officer con frecuencia sin poder eliminar mucha orina.  Dolor en el flanco, que se encuentra justo debajo de la caja torcica y por encima de la cintura en ambos lados de la espalda.  Sangre en la orina.  Grant Ruts.   Vigile de cerca los cambios en su salud y asegrese de comunicarse con su mdico si:    Tiene algn problema con la sonda.     No mejora como se  esperaba.   Dnde puede encontrar ms informacin?  Vaya a [email or web address] e escriba M244 para ms informacin sobre "Retencin urinaria: Instrucciones de cuidado."  Revisado: 15 noviembre, 2023  Versin del contenido: 14.1   2006-2024 Healthwise, Incorporated.   Las  instrucciones de cuidado fueron adaptadas bajo licencia por [employer]. Si usted tiene preguntas sobre una afeccin mdica o sobre estas instrucciones, siempre pregunte a su profesional de salud. Healthwise, Incorporated niega toda garanta o responsabilidad por su uso de esta informacin.

## 2023-03-23 NOTE — Discharge Summary (Signed)
[facility]  [address] [place], [place] [address]  [phone]          Hospitalist Discharge Summary     Patient ID:  Melinda [name]  [id number]  42 y.o.  [birth year]    Admit date: 03/16/2023    Discharge date and time: 03/23/2023 9:38 AM    Admission Diagnoses: Hypokalemia [E87.6]  Hypertensive urgency [I16.0]  Intractable nausea and vomiting [R11.2]  Intractable epigastric abdominal pain [R10.13]  Nausea and vomiting, unspecified vomiting type [R11.2]    Discharge Diagnoses:  Principal Diagnosis Intractable nausea and vomiting                                            Principal Problem:    Intractable nausea and vomiting  Active Problems:    Gastroparesis  Resolved Problems:    * No resolved hospital problems. *         Hospital Course:     42 yo hx of HTN, DM, CKD 3, lap chole, presented w/ intractable abd pain, N/V, elevated LFTs     1) Intractable abd pain/N/V: much improved.  Unclear etiology.  Suspect constipation w/ gastroparesis.  Extensive w/u, including abd CT, head CT, gastric emptying, EGD, colonoscopy, capsule, have been unrevealing.  Will cont reglan, bowel regimen.  GI was following     2) Hx of H. Pylori: s/p triple therapy     3) Elevated LFTs: unclear etiology.  Already had extensive w/u.  Hepatitis panel neg     4) Malignant HTN: BP better.  Cont norvasc, metoprolol, lisinopril     5) Anemia: has some iron def.  S/p IV iron     6) DM type 2: A1C 8.5%.  cont home insulin 70/30     7) CKD 3: Cr stable     8) Recent UTI: s/p IV CTX     9) Acute urine retention: foley placed on 06/10.  Will remove foley prior to discharge.  Urology was following     10) HypoK/Mg/phos: repleted     PCP: Idowu, Funmilola, APRN - NP     Consults: GI, Urology    Significant Diagnostic Studies: EGD, colonoscopy, capsule, gastric emptying, abd CT    Discharge Exam:  Physical Exam:    Gen: Well-developed, well-nourished, in no acute distress  HEENT:  Pink conjunctivae, PERRL,  hearing intact to voice, moist mucous membranes  Neck: Supple, without masses, thyroid non-tender  Resp: No accessory muscle use, clear breath sounds without wheezes rales or rhonchi  Card: No murmurs, normal S1, S2 without thrills, bruits or peripheral edema  Abd:  Soft, non-tender, non-distended, normoactive bowel sounds are present  Lymph:  No cervical or inguinal adenopathy  Musc: No cyanosis or clubbing  Skin: No rashes  Neuro:  Cranial nerves are grossly intact, no focal motor weakness, follows commands appropriately  Psych:  Good insight, oriented to person, place and time, alert.  Spanish speaking     Disposition: home  Discharge Condition: Stable    Patient Instructions:   Current Discharge Medication List        START taking these medications    Details   oxyCODONE (OXY-IR) 10 MG immediate release tablet Take 1 tablet by mouth every 6 hours as needed for Pain for up to 5 days. Max Daily Amount: 40 mg  Qty: 12 tablet,  Refills: 0    Comments: Reduce doses taken as pain becomes manageable  Associated Diagnoses: Intractable epigastric abdominal pain      sennosides-docusate sodium (SENOKOT-S) 8.6-50 MG tablet Take 1 tablet by mouth in the morning and at bedtime  Qty: 60 tablet, Refills: 2           CONTINUE these medications which have CHANGED    Details   ondansetron (ZOFRAN-ODT) 4 MG disintegrating tablet Take 1 tablet by mouth every 8 hours as needed for Nausea or Vomiting  Qty: 30 tablet, Refills: 0      lisinopril (PRINIVIL;ZESTRIL) 40 MG tablet Take 1 tablet by mouth daily  Qty: 30 tablet, Refills: 2      metoprolol succinate (TOPROL XL) 50 MG extended release tablet Take 1 tablet by mouth daily  Qty: 30 tablet, Refills: 2      amLODIPine (NORVASC) 10 MG tablet Take 1 tablet by mouth daily  Qty: 30 tablet, Refills: 2      polyethylene glycol (GLYCOLAX) 17 g packet Take 1 packet by mouth 2 times daily  Qty: 60 packet, Refills: 2      metoclopramide (REGLAN) 5 MG tablet Take 1 tablet by mouth 3 times daily  (before meals)  Qty: 90 tablet, Refills: 1      pantoprazole (PROTONIX) 40 MG tablet Take 1 tablet by mouth 2 times daily (before meals)  Qty: 60 tablet, Refills: 1           CONTINUE these medications which have NOT CHANGED    Details   insulin 70-30 (NOVOLIN 70/30) (70-30) 100 UNIT per ML injection vial Inject 15 Units into the skin 2 times daily  Qty: 9 mL, Refills: 1      naloxone (NARCAN) 4 MG/0.1ML LIQD nasal spray 1 spray by Nasal route as needed for Opioid Reversal  Qty: 2 each, Refills: 0           Activity: activity as tolerated  Diet: diabetic diet  Wound Care: none needed    Follow-up with  [facility]  [facility]  [address]  [place] [address]  [phone]  Schedule an appointment as soon as possible for a visit in 1 month(s)  Call to schedule appointment    Orlean Bradford, APRN - NP  [address]  [place] [address]  [phone]    Schedule an appointment as soon as possible for a visit in 1 week(s)      Glyn Ade, MD  [address]  [place] [address]    Schedule an appointment as soon as possible for a visit in 2 week(s)        Follow-up tests/labs none    Signed:  Paulo Fruit, MD  03/23/2023  9:38 AM  **I personally spent 35 min on discharge**

## 2023-03-23 NOTE — Consults (Signed)
Session ID: [id number]  Language: Spanish  Interpreter ID: #[id number]  Interpreter Name: Angela Adam

## 2023-03-23 NOTE — Care Coordination-Inpatient (Signed)
03/23/2023   CARE MANAGEMENT NOTE:  CM reviewed EMR and handoff received from previous case manager Antonietta Jewel).  Pt was admitted with N/V, malignant HTN, DM.  Reportedly, pt resides with family in a trailer.    Spanish speaking    RUR 25%    Transition Plan of Care:  Pt will discharge home with family  Outpatient follow up at [facility] free clinic  Family will transport pt home    No discharge needs are indicated.  D.Deep Bonawitz

## 2023-03-23 NOTE — Plan of Care (Signed)
Problem: Discharge Planning  Goal: Discharge to home or other facility with appropriate resources  03/23/2023 0950 by Sanda Linger, RN  Outcome: Adequate for Discharge  Flowsheets (Taken 03/23/2023 0331 by Reglos, Almon Hercules, RN)  Discharge to home or other facility with appropriate resources: Identify barriers to discharge with patient and caregiver     Problem: Pain  Goal: Verbalizes/displays adequate comfort level or baseline comfort level  03/23/2023 0950 by Sanda Linger, RN  Outcome: Adequate for Discharge     Problem: Chronic Conditions and Co-morbidities  Goal: Patient's chronic conditions and co-morbidity symptoms are monitored and maintained or improved  03/23/2023 0950 by Sanda Linger, RN  Outcome: Adequate for Discharge  Flowsheets (Taken 03/23/2023 0331 by Reglos, Almon Hercules, RN)  Care Plan - Patient's Chronic Conditions and Co-Morbidity Symptoms are Monitored and Maintained or Improved: Monitor and assess patient's chronic conditions and comorbid symptoms for stability, deterioration, or improvement     Problem: Safety - Adult  Goal: Free from fall injury  03/23/2023 0950 by Sanda Linger, RN  Outcome: Adequate for Discharge     Problem: Skin/Tissue Integrity  Goal: Absence of new skin breakdown  Description: 1.  Monitor for areas of redness and/or skin breakdown  2.  Assess vascular access sites hourly  3.  Every 4-6 hours minimum:  Change oxygen saturation probe site  4.  Every 4-6 hours:  If on nasal continuous positive airway pressure, respiratory therapy assess nares and determine need for appliance change or resting period.  03/23/2023 0950 by Sanda Linger, RN  Outcome: Adequate for Discharge

## 2023-03-24 LAB — CULTURE, BLOOD 1: Culture: NO GROWTH

## 2023-03-24 LAB — CULTURE, BLOOD 2: Culture: NO GROWTH

## 2023-05-08 ENCOUNTER — Emergency Department: Admit: 2023-05-09 | Payer: MEDICAID | Primary: Adult Health

## 2023-05-08 DIAGNOSIS — E1143 Type 2 diabetes mellitus with diabetic autonomic (poly)neuropathy: Principal | ICD-10-CM

## 2023-05-08 DIAGNOSIS — R112 Nausea with vomiting, unspecified: Secondary | ICD-10-CM

## 2023-05-08 NOTE — ED Provider Notes (Signed)
EMERGENCY DEPARTMENT PHYSICIAN NOTE     Patient: Melinda [name]     Time of Service: 05/08/2023  8:31 PM     Chief complaint:   Chief Complaint   Patient presents with    Abdominal Pain    Emesis        HISTORY:  Patient is a 42 y.o. female who presents to the emergency department with complaints of severe nausea vomiting abdominal pain.  Patient has multiple admissions for this in the past with patient describing no cause of her pain.  Patient has diabetes and I asked family if patient has gastroparesis.  They state it may have been mentioned in the past but she has no diagnosis of this.  Patient no acute distress with severe pain and active vomiting while in triage.  Tachycardic.  Patient ill-appearing.  Patient afebrile      Past Medical History:   Diagnosis Date    High blood pressure     High cholesterol     Type 2 diabetes mellitus (HCC)     Controled        Past Surgical History:   Procedure Laterality Date    CAPSULE ENDOSCOPY N/A 01/20/2023    ESOPHAGEAL CAPSULE ENDOSCOPY remove at 1624PM performed by Glyn Ade, MD at [facility] ENDOSCOPY    CHOLECYSTECTOMY, LAPAROSCOPIC N/A 02/03/2023    ROBOTIC LAPAROSCOPIC CHOLECYSTECTOMY with Indocyanine green performed by Verdis Frederickson, MD at [facility] MAIN OR    COLONOSCOPY N/A 01/19/2023    COLONOSCOPY DIAGNOSTIC performed by Glyn Ade, MD at [facility] ENDOSCOPY    OTHER SURGICAL HISTORY Left     Rentia attachment    TUBAL LIGATION Bilateral     UPPER GASTROINTESTINAL ENDOSCOPY N/A 01/17/2023    ESOPHAGOGASTRODUODENOSCOPY performed by Glyn Ade, MD at [facility] ENDOSCOPY    UPPER GASTROINTESTINAL ENDOSCOPY N/A 01/17/2023    ESOPHAGOGASTRODUODENOSCOPY BIOPSY performed by Glyn Ade, MD at [facility] ENDOSCOPY    UPPER GASTROINTESTINAL ENDOSCOPY N/A 01/18/2023    ESOPHAGOGASTRODUODENOSCOPY performed by Glyn Ade, MD at [facility] ENDOSCOPY    [place] ABSCESS DRAINAGE PERITONEAL  02/11/2023    [place] ABSCESS DRAINAGE PERITONEAL 02/11/2023 [facility] [place]        No family history on file.      Social History     Socioeconomic History    Marital status: Married   Tobacco Use    Smoking status: Never    Smokeless tobacco: Never   Vaping Use    Vaping Use: Never used   Substance and Sexual Activity    Alcohol use: Never    Drug use: Never     Social Determinants of Health     Food Insecurity: Food Insecurity Present (03/16/2023)    Hunger Vital Sign     Worried About Running Out of Food in the Last Year: Sometimes true     Ran Out of Food in the Last Year: Sometimes true   Transportation Needs: No Transportation Needs (03/16/2023)    PRAPARE - Therapist, art (Medical): No     Lack of Transportation (Non-Medical): No   Housing Stability: Low Risk  (03/16/2023)    Housing Stability Vital Sign     Unable to Pay for Housing in the Last Year: No     Number of Places Lived in the Last Year: 1     Unstable Housing in the Last Year: No        Current Medications: Reviewed in chart.    Allergies:  No Known Allergies       REVIEW OF SYSTEMS: See HPI for pertinent positives and negatives.      PHYSICAL EXAM:  BP (!) 162/125   Pulse (!) 117   Temp 98.4 F (36.9 C) (Oral)   Resp 14   Ht 1.499 m (4\' 11" )   Wt 60 kg (132 lb 4.4 oz)   LMP 05/07/2023   SpO2 100%   BMI 26.72 kg/m    Physical Exam  Vitals and nursing note reviewed.   Constitutional:       General: She is in acute distress.      Appearance: Normal appearance.   HENT:      Head: Normocephalic and atraumatic.      Right Ear: External ear normal.      Left Ear: External ear normal.      Nose: Nose normal.      Mouth/Throat:      Mouth: Mucous membranes are moist.   Eyes:      Extraocular Movements: Extraocular movements intact.      Conjunctiva/sclera: Conjunctivae normal.   Cardiovascular:      Rate and Rhythm: Regular rhythm. Tachycardia present.      Pulses: Normal pulses.   Pulmonary:      Effort: Pulmonary effort is normal.      Breath sounds: Normal breath sounds.   Abdominal:      Palpations: Abdomen is soft.       Tenderness: There is generalized abdominal tenderness.   Musculoskeletal:         General: No tenderness. Normal range of motion.      Cervical back: Normal range of motion.   Skin:     General: Skin is warm and dry.   Neurological:      General: No focal deficit present.      Mental Status: She is alert and oriented to person, place, and time.   Psychiatric:         Mood and Affect: Mood normal.         Behavior: Behavior normal.           ED Course:    ED Course as of 05/08/23 2300   Sun May 08, 2023   2138 Creatinine(!): 1.37 [AL]   2138 Glucose(!): 206 [AL]   2138 Anion Gap: 8 [AL]   2140 Preg, Serum: Negative [AL]   2140 Lipase: 33 [AL]   2140 Creatinine(!): 1.37 [AL]      ED Course User Index  [AL] Janeece Fitting, MD           Laboratory Results:  Labs Reviewed   CBC WITH AUTO DIFFERENTIAL - Abnormal; Notable for the following components:       Result Value    WBC 11.1 (*)     Hematocrit 33.9 (*)     Platelets 432 (*)     All other components within normal limits   COMPREHENSIVE METABOLIC PANEL - Abnormal; Notable for the following components:    Chloride 110 (*)     CO2 20 (*)     Glucose 206 (*)     BUN 28 (*)     Creatinine 1.37 (*)     Est, Glom Filt Rate 50 (*)     Alk Phosphatase 121 (*)     Albumin 2.9 (*)     Globulin 4.1 (*)     Albumin/Globulin Ratio 0.7 (*)     All other components within normal limits   URINE  CULTURE HOLD SAMPLE   ETHANOL   LIPASE   MAGNESIUM   HCG, SERUM, QUALITATIVE   URINALYSIS WITH MICROSCOPIC     ED physician interpretation of laboratory results: Documented in ED course    Imaging Results:  CT ABDOMEN PELVIS W IV CONTRAST Additional Contrast? None   Final Result   No acute abnormality. Status post cholecystectomy.      Electronically signed by Carola Rhine        ED physician interpretation of imaging: Documented in ED course    Medications Given:  Medications   morphine (PF) injection 6 mg (6 mg IntraVENous Given 05/08/23 2147)   lactated ringers bolus 1,000 mL (0 mLs  IntraVENous Stopped 05/08/23 2148)   droPERidol (INAPSINE) injection 1.25 mg (1.25 mg IntraVENous Given 05/08/23 2054)   ketorolac (TORADOL) injection 15 mg (15 mg IntraVENous Given 05/08/23 2123)   ondansetron (ZOFRAN) injection 4 mg (4 mg IntraVENous Given 05/08/23 2123)   iopamidol (ISOVUE-370) 76 % injection 100 mL (100 mLs IntraVENous Given 05/08/23 2206)       Differential Diagnosis included but not limited to: Nausea vomiting, abdominal pain, surgical abdomen, dehydration, electrolyte abnormality, gastroparesis    Medical Decision Making  Patient is a 42 year old female present to ED with intractable nausea vomiting abdominal pain.  Patient required multiple doses of antiemetics and pain medications.  Patient still in significant pain.  Workup reassuring but patient does have slightly elevated serum creatinine likely secondary to inability to tolerate food and drink by mouth due to nausea and vomiting.  Based on patient's intractable pain, tachycardia, unexplained symptoms hospitalization is indicated for further treatment and evaluation.  Hospital service contacted and patient admitted    Amount and/or Complexity of Data Reviewed  Independent Historian: caregiver  Labs: ordered. Decision-making details documented in ED Course.  Radiology: ordered.    Risk  Prescription drug management.          Procedures       DISPOSITION: Decision To Admit 05/08/2023 10:36:23 PM    Perfect Serve Consult for Admission  11:00 PM    ED Room Number: ER20/20  Patient Name and age:  Melinda [name] 42 y.o.  female  Working Diagnosis:   1. Nausea and vomiting, unspecified vomiting type    2. Intractable abdominal pain    3. Elevated serum creatinine    4. Hyperglycemia        COVID-19 Suspicion: No  Sepsis present:  No  Reassessment needed: No  Code Status:  Full Code  Readmission: No  Isolation Requirements: no  Recommended Level of Care: telemetry  Department: [facility] - [id number]      Other: Patient with multiple  admissions for intractable nausea and vomiting with no specific cause identified per patient.  I suspect patient may have gastroparesis due to her diabetes.  However patient in severe pain.  CT scan shows no acute process.  Mild elevation in serum creatinine from baseline.  Based on continued nausea vomiting and intractable pain requiring IV narcotics hospitalization is indicated for further treatment evaluation.      Janeece Fitting, MD   Emergency Medicine Attending Physician              Janeece Fitting, MD  05/08/23 [phone]

## 2023-05-08 NOTE — ED Notes (Signed)
Bedside and Verbal shift change report given to Santina Evans (Cabin crew) by Konrad Penta (offgoing nurse). Report included the following information Nurse Handoff Report, Index, ED Encounter Summary, ED SBAR, Adult Overview, Intake/Output, MAR, Recent Results, Neuro Assessment, and Event Log.

## 2023-05-08 NOTE — ED Triage Notes (Signed)
Session: [id number]  Interpreter: [id number]  Spanish    Pt ambulatory to triage c/o vomiting since last night and it's causing stomach pain and back pain.     Provider in triage

## 2023-05-08 NOTE — H&P (Signed)
Hospitalist Admission Note    NAME: Melinda [name]   DOB:  July 19, 1981   MRN:  [id number]     Date/Time:  05/08/2023 11:22 PM    Patient PCP: Orlean Bradford, APRN - NP    Please note that this dictation was completed with Dragon, the computer voice recognition software.  Quite often unanticipated grammatical, syntax, homophones, and other interpretive errors are inadvertently transcribed by the computer software.  Please disregard these errors.  Please excuse any errors that have escaped final proofreading  ________________________________________________________________________    Given the patient's current clinical presentation, I have a high level of concern for decompensation if discharged from the emergency department.  Complex decision making was performed, which includes reviewing the patient's available past medical records, laboratory results, and x-ray films.       My assessment of this patient's clinical condition and my plan of care is as follows.    Assessment / Plan:    Intractable upper abd pain/N/V:    --Unclear etiology.  Suspect constipation w/ gastroparesis.  Prior extensive w/u, including abd CT, head CT, gastric emptying, EGD, colonoscopy, capsule, have been unrevealing.     --IV compazine 10mg  q6h scheduled x 2 days, IV ativan prn.  Unable to give reglan due to prolong Qtc  --IV dilaudid prn but advised pt avoid asking.  --clear liquid as tolerated    Constipation   moderate fecal stasis on CT; soap sud enema until clear, dulcolax PR daily x 3 days, miralax, pericolace.    Yeast in urine:  diflucan IV x 5 days    Hx of H. Pylori: s/p triple therapy; IV pepcid     IDDM:  put on lantus 24 units daily, moderate dose SSI.    HTN:  IV labetalol prn.  Cont norvasc if able to take.  Hold lisinopril due to elevated Cr    CKD 3:  baseline Cr 1.1.2; IVF      Medical Decision Making:   I have personally reviewed the radiographs, laboratory data in Epic and decisions and statements above are  based partially on this personal interpretation.    Drug monitoring:       Code Status: Full Code  DVT Prophylaxis: Lovenox  GI Prophylaxis:  pepcid         Subjective:   CHIEF COMPLAINT: "abdominal pain, n/v"    HISTORY OF PRESENT ILLNESS:   daughter Melinda [name] bedside providing translation service; patient declined use of virtual translator  Melinda [name] is a 42 y.o.  female with PMHx hypertension, insulin-dependent diabetes 2, history of recurrent epigastric pain with intractable nausea and vomiting of unclear etiology presents to the emergency room with severe upper abdominal pain 8-9 out of 10, constant, associated with several episodes of nausea and vomiting with liquid and yellow bile.  Denies any blood in emesis.  Denies any constipation, last bowel movement was yesterday with brown stool.  No chest pain shortness of breath dysuria, fevers or chills.    Patient was last admitted in May with similar symptoms and was attributed to gastroparesis and constipation.    Available records were reviewed at the time of H&P.        Past Medical History:   Diagnosis Date    High blood pressure     High cholesterol     Type 2 diabetes mellitus (HCC)     Controled      Past Surgical History:   Procedure Laterality Date    CAPSULE ENDOSCOPY N/A  01/20/2023    ESOPHAGEAL CAPSULE ENDOSCOPY remove at 1624PM performed by Glyn Ade, MD at [facility] ENDOSCOPY    CHOLECYSTECTOMY, LAPAROSCOPIC N/A 02/03/2023    ROBOTIC LAPAROSCOPIC CHOLECYSTECTOMY with Indocyanine green performed by Verdis Frederickson, MD at [facility] MAIN OR    COLONOSCOPY N/A 01/19/2023    COLONOSCOPY DIAGNOSTIC performed by Glyn Ade, MD at [facility] ENDOSCOPY    OTHER SURGICAL HISTORY Left     Rentia attachment    TUBAL LIGATION Bilateral     UPPER GASTROINTESTINAL ENDOSCOPY N/A 01/17/2023    ESOPHAGOGASTRODUODENOSCOPY performed by Glyn Ade, MD at [facility] ENDOSCOPY    UPPER GASTROINTESTINAL ENDOSCOPY N/A 01/17/2023    ESOPHAGOGASTRODUODENOSCOPY BIOPSY performed by  Glyn Ade, MD at [facility] ENDOSCOPY    UPPER GASTROINTESTINAL ENDOSCOPY N/A 01/18/2023    ESOPHAGOGASTRODUODENOSCOPY performed by Glyn Ade, MD at [facility] ENDOSCOPY    [place] ABSCESS DRAINAGE PERITONEAL  02/11/2023    [place] ABSCESS DRAINAGE PERITONEAL 02/11/2023 [facility] [place]     Social History     Tobacco Use    Smoking status: Never    Smokeless tobacco: Never   Substance Use Topics    Alcohol use: Never      No family history on file.     No Known Allergies     Prior to Admission medications    Medication Sig Start Date End Date Taking? Authorizing Provider   ondansetron (ZOFRAN-ODT) 4 MG disintegrating tablet Take 1 tablet by mouth every 8 hours as needed for Nausea or Vomiting 03/23/23   Do, Rozell Searing B, MD   lisinopril (PRINIVIL;ZESTRIL) 40 MG tablet Take 1 tablet by mouth daily 03/23/23   Do, Khoi B, MD   metoprolol succinate (TOPROL XL) 50 MG extended release tablet Take 1 tablet by mouth daily 03/23/23   Do, Khoi B, MD   amLODIPine (NORVASC) 10 MG tablet Take 1 tablet by mouth daily 03/23/23   Do, Khoi B, MD   polyethylene glycol (GLYCOLAX) 17 g packet Take 1 packet by mouth 2 times daily 03/23/23   Do, Khoi B, MD   sennosides-docusate sodium (SENOKOT-S) 8.6-50 MG tablet Take 1 tablet by mouth in the morning and at bedtime 03/23/23   Do, Khoi B, MD   metoclopramide (REGLAN) 5 MG tablet Take 1 tablet by mouth 3 times daily (before meals) 03/23/23   Do, Khoi B, MD   pantoprazole (PROTONIX) 40 MG tablet Take 1 tablet by mouth 2 times daily (before meals) 03/23/23   Do, Khoi B, MD   insulin 70-30 (NOVOLIN 70/30) (70-30) 100 UNIT per ML injection vial Inject 15 Units into the skin 2 times daily 02/14/23 03/16/23  Dobransky, Kathlen Mody, MD   naloxone [facility]) 4 MG/0.1ML LIQD nasal spray 1 spray by Nasal route as needed for Opioid Reversal 01/20/23   Horald Chestnut, DO       REVIEW OF SYSTEMS:  See HPI for details   POSITIVE= Bold.  Negative = normal text  General:  fever, chills, sweats, generalized weakness, weight loss/gain, loss of appetite  Eyes:   blurred vision, eye pain, loss of vision, diplopia  Ear Nose and Throat:  rhinorrhea, pharyngitis  Respiratory:   cough, sputum production, SOB, wheezing, DOE, pleuritic pain  Cardiology:  chest pain, palpitations, orthopnea, PND, edema, syncope   Gastrointestinal:  abdominal pain, N/V, dysphagia, diarrhea, constipation, bleeding  Genitourinary:  frequency, urgency, dysuria, hematuria, incontinence  Muskuloskeletal :  arthralgia, myalgia  Hematology:  easy bruising, bleeding, lymphadenopathy  Dermatological:  rash, ulceration, pruritis  Endocrine:  hot flashes or polydipsia  Neurological:  headache, dizziness, confusion, focal weakness, paresthesia, memory loss, gait disturbance  Psychological: anxiety, depression, agitation      Objective:   VITALS:    Vitals:    05/08/23 2045   BP: (!) 162/125   Pulse: (!) 117   Resp: 14   Temp:    SpO2: 100%     Patient Vitals for the past 24 hrs:   BP Temp Temp src Pulse Resp SpO2 Height Weight   05/08/23 2045 (!) 162/125 -- -- (!) 117 14 100 % -- --   05/08/23 2027 (!) 199/91 98.4 F (36.9 C) Oral (!) 122 18 100 % 1.499 m (4\' 11" ) 60 kg (132 lb 4.4 oz)       Temp (24hrs), Avg:98.4 F (36.9 C), Min:98.4 F (36.9 C), Max:98.4 F (36.9 C)           Wt Readings from Last 12 Encounters:   05/08/23 60 kg (132 lb 4.4 oz)   03/16/23 61.2 kg (135 lb)   02/17/23 61.2 kg (135 lb)   02/10/23 61.2 kg (135 lb)   02/01/23 65.8 kg (145 lb)   01/18/23 65.8 kg (145 lb)   06/28/22 59.9 kg (132 lb)   05/15/22 62.6 kg (138 lb)   02/16/22 65.8 kg (144 lb 15.9 oz)   09/07/21 64.9 kg (143 lb)   07/27/21 56.7 kg (125 lb)   07/21/21 56.7 kg (125 lb)     Body mass index is 26.72 kg/m.    PHYSICAL EXAM:    Gen: Ill appearing, keep eyes closed, Well-developed, well-nourished  HEENT:  Pink conjunctivae, PERRL, hearing intact to voice, dry mucous membranes  Neck: Supple, without masses, thyroid non-tender  Resp: No accessory muscle use, clear breath sounds without wheezes rales or rhonchi  Card: Mild  tachycardia, No murmurs, normal S1, S2 without thrills, bruits or peripheral edema  Abd:  Soft, hypoactive BS, tender across upper abdomen, non-distended, no palpable organomegaly and no detectable hernias  Lymph:  No cervical or inguinal adenopathy  Musc: No cyanosis or clubbing  Skin: No rashes or ulcers, skin turgor is good  Neuro:  Cranial nerves are grossly intact, no focal motor weakness, follows commands appropriately  Psych:  Good insight, oriented to person, place and time, alert          _______________________________________________________________________  Care Plan discussed with:    Comments   Patient y Discussed with patient in room. POC outlined and Questions answered    Family  y Publishing rights manager x    Care Manager                    Consultant:  x ED MD   _______________________________________________________________________  Recommended Disposition:   Home with Family y   HH/PT/OT/RN    SNF/LTC    [facility]    ________________________________________________________________________  TOTAL TIME:  65 Minutes        Comments   >50% of visit spent in counseling and coordination of care  Chart reviewed  Discussion with patient and/or family and questions answered     LAB DATA REVIEWED:    Recent Results (from the past 12 hour(s))   CBC with Auto Differential    Collection Time: 05/08/23  8:32 PM   Result Value Ref Range    WBC 11.1 (H) 3.6 - 11.0 K/uL    RBC 4.02 3.80 - 5.20 M/uL    Hemoglobin 11.7 11.5 - 16.0 g/dL  Hematocrit 33.9 (L) 35.0 - 47.0 %    MCV 84.3 80.0 - 99.0 FL    MCH 29.1 26.0 - 34.0 PG    MCHC 34.5 30.0 - 36.5 g/dL    RDW 16.1 09.6 - 04.5 %    Platelets 432 (H) 150 - 400 K/uL    MPV 8.9 8.9 - 12.9 FL    Nucleated RBCs 0.0 0 PER 100 WBC    nRBC 0.00 0.00 - 0.01 K/uL    Neutrophils % 64 32 - 75 %    Lymphocytes % 26 12 - 49 %    Monocytes % 6 5 - 13 %    Eosinophils % 3 0 - 7 %    Basophils % 1 0 - 1 %    Immature Granulocytes % 0 0.0 - 0.5 %    Neutrophils Absolute 7.1 1.8 - 8.0 K/UL     Lymphocytes Absolute 2.8 0.8 - 3.5 K/UL    Monocytes Absolute 0.6 0.0 - 1.0 K/UL    Eosinophils Absolute 0.4 0.0 - 0.4 K/UL    Basophils Absolute 0.1 0.0 - 0.1 K/UL    Immature Granulocytes Absolute 0.0 0.00 - 0.04 K/UL    Differential Type AUTOMATED     Comprehensive Metabolic Panel    Collection Time: 05/08/23  8:32 PM   Result Value Ref Range    Sodium 138 136 - 145 mmol/L    Potassium 4.2 3.5 - 5.1 mmol/L    Chloride 110 (H) 97 - 108 mmol/L    CO2 20 (L) 21 - 32 mmol/L    Anion Gap 8 5 - 15 mmol/L    Glucose 206 (H) 65 - 100 mg/dL    BUN 28 (H) 6 - 20 MG/DL    Creatinine 4.09 (H) 0.55 - 1.02 MG/DL    BUN/Creatinine Ratio 20 12 - 20      Est, Glom Filt Rate 50 (L) >60 ml/min/1.29m2    Calcium 8.6 8.5 - 10.1 MG/DL    Total Bilirubin 0.3 0.2 - 1.0 MG/DL    ALT 29 12 - 78 U/L    AST 22 15 - 37 U/L    Alk Phosphatase 121 (H) 45 - 117 U/L    Total Protein 7.0 6.4 - 8.2 g/dL    Albumin 2.9 (L) 3.5 - 5.0 g/dL    Globulin 4.1 (H) 2.0 - 4.0 g/dL    Albumin/Globulin Ratio 0.7 (L) 1.1 - 2.2     Lipase    Collection Time: 05/08/23  8:32 PM   Result Value Ref Range    Lipase 33 13 - 75 U/L   Magnesium    Collection Time: 05/08/23  8:32 PM   Result Value Ref Range    Magnesium 2.0 1.6 - 2.4 mg/dL   HCG Qualitative, Serum    Collection Time: 05/08/23  8:32 PM   Result Value Ref Range    Preg, Serum Negative NEG     ETOH    Collection Time: 05/08/23  9:24 PM   Result Value Ref Range    Ethanol Lvl <10 <10 MG/DL         CT Result (most recent):  CT ABDOMEN PELVIS W IV CONTRAST 05/08/2023    Narrative  EXAM: CT ABDOMEN PELVIS W IV CONTRAST    INDICATION: severe intractable abdominal pain with n/v    COMPARISON: 03/20/2023    CONTRAST: 1 hide mL of Isovue-[id number].    ORAL CONTRAST: None    TECHNIQUE:  Following intravenous  administration of contrast, thin axial images were  obtained through the abdomen and pelvis. Coronal and sagittal reconstructions  were generated. CT dose reduction was achieved through use of a  standardized  protocol tailored for this examination and automatic exposure control for dose  modulation.    FINDINGS:  LOWER THORAX: No significant abnormality in the incidentally imaged lower chest.  LIVER: No mass.  BILIARY TREE: Status post cholecystectomy. CBD is not dilated.  SPLEEN: within normal limits.  PANCREAS: No mass or ductal dilatation.  ADRENALS: Unremarkable.  KIDNEYS: No mass, calculus, or hydronephrosis.  STOMACH: Unremarkable.  SMALL BOWEL: No dilatation or wall thickening.  COLON: No dilatation or wall thickening. Moderate fecal stasis.  APPENDIX: Not distended.  PERITONEUM: No ascites or pneumoperitoneum.  RETROPERITONEUM: No lymphadenopathy or aortic aneurysm.  REPRODUCTIVE ORGANS: Unremarkable.  URINARY BLADDER: No mass or calculus.  BONES: No destructive bone lesion.  ABDOMINAL WALL: No mass or hernia.  ADDITIONAL COMMENTS: N/A    Impression  No acute abnormality. Status post cholecystectomy.    Electronically signed by Loman Chroman Result (most recent):  XR CHEST PORTABLE 03/16/2023    Narrative  EXAM:  XR CHEST PORTABLE    INDICATION: epigastric, upper chest pain    COMPARISON: none    TECHNIQUE: portable chest AP view    FINDINGS: The cardiac silhouette is within normal limits. The pulmonary  vasculature is within normal limits.    Calcified granuloma in the left lung is unchanged. There is no new airspace  disease or pleural effusion. The visualized bones and upper abdomen are  age-appropriate.    Impression  No acute process on portable chest.      ________________________________________________________________________  Signed: Georgianne Fick, MD        Procedures: see electronic medical records for all procedures/Xrays/labs and details which were not copied into this note but were reviewed prior to creation of Plan.

## 2023-05-08 NOTE — Consults (Signed)
Session ID: [id number]  Language: Spanish  Interpreter ID: #[id number]  Interpreter Name: Ricki Rodriguez

## 2023-05-09 ENCOUNTER — Inpatient Hospital Stay
Admission: EM | Admit: 2023-05-09 | Discharge: 2023-05-14 | Disposition: A | Discharge status: Home / Self Care | Payer: MEDICAID | Admitting: Internal Medicine

## 2023-05-09 LAB — CBC WITH AUTO DIFFERENTIAL
Basophils %: 1 % (ref 0–1)
Basophils %: 1 % (ref 0–1)
Basophils Absolute: 0.1 10*3/uL (ref 0.0–0.1)
Basophils Absolute: 0.1 10*3/uL (ref 0.0–0.1)
Eosinophils %: 3 % (ref 0–7)
Eosinophils %: 1 % (ref 0–7)
Eosinophils Absolute: 0.4 10*3/uL (ref 0.0–0.4)
Eosinophils Absolute: 0.1 10*3/uL (ref 0.0–0.4)
Hematocrit: 33.9 % — ABNORMAL LOW (ref 35.0–47.0)
Hematocrit: 30.9 % — ABNORMAL LOW (ref 35.0–47.0)
Hemoglobin: 11.7 g/dL (ref 11.5–16.0)
Hemoglobin: 10.4 g/dL — ABNORMAL LOW (ref 11.5–16.0)
Immature Granulocytes %: 0 % (ref 0.0–0.5)
Immature Granulocytes %: 0 % (ref 0.0–0.5)
Immature Granulocytes Absolute: 0 10*3/uL (ref 0.00–0.04)
Immature Granulocytes Absolute: 0 10*3/uL (ref 0.00–0.04)
Lymphocytes %: 26 % (ref 12–49)
Lymphocytes %: 16 % (ref 12–49)
Lymphocytes Absolute: 2.8 10*3/uL (ref 0.8–3.5)
Lymphocytes Absolute: 1.5 10*3/uL (ref 0.8–3.5)
MCH: 29.1 PG (ref 26.0–34.0)
MCH: 28.8 PG (ref 26.0–34.0)
MCHC: 34.5 g/dL (ref 30.0–36.5)
MCHC: 33.7 g/dL (ref 30.0–36.5)
MCV: 84.3 FL (ref 80.0–99.0)
MCV: 85.6 FL (ref 80.0–99.0)
MPV: 8.9 FL (ref 8.9–12.9)
MPV: 9 FL (ref 8.9–12.9)
Monocytes %: 6 % (ref 5–13)
Monocytes %: 5 % (ref 5–13)
Monocytes Absolute: 0.6 10*3/uL (ref 0.0–1.0)
Monocytes Absolute: 0.5 10*3/uL (ref 0.0–1.0)
Neutrophils %: 64 % (ref 32–75)
Neutrophils %: 77 % — ABNORMAL HIGH (ref 32–75)
Neutrophils Absolute: 7.1 10*3/uL (ref 1.8–8.0)
Neutrophils Absolute: 7.1 10*3/uL (ref 1.8–8.0)
Nucleated RBCs: 0 PER 100 WBC
Nucleated RBCs: 0 PER 100 WBC
Platelets: 432 10*3/uL — ABNORMAL HIGH (ref 150–400)
Platelets: 385 10*3/uL (ref 150–400)
RBC: 4.02 M/uL (ref 3.80–5.20)
RBC: 3.61 M/uL — ABNORMAL LOW (ref 3.80–5.20)
RDW: 13.7 % (ref 11.5–14.5)
RDW: 13.6 % (ref 11.5–14.5)
WBC: 11.1 10*3/uL — ABNORMAL HIGH (ref 3.6–11.0)
WBC: 9.3 10*3/uL (ref 3.6–11.0)
nRBC: 0 10*3/uL (ref 0.00–0.01)
nRBC: 0 10*3/uL (ref 0.00–0.01)

## 2023-05-09 LAB — EKG 12-LEAD
Atrial Rate: 111 {beats}/min
P Axis: 53 degrees
P-R Interval: 138 ms
Q-T Interval: 372 ms
QRS Duration: 82 ms
QTc Calculation (Bazett): 505 ms
R Axis: 254 degrees
T Axis: 56 degrees
Ventricular Rate: 111 {beats}/min

## 2023-05-09 LAB — HCG, SERUM, QUALITATIVE: Preg, Serum: NEGATIVE

## 2023-05-09 LAB — COMPREHENSIVE METABOLIC PANEL
ALT: 29 U/L (ref 12–78)
AST: 22 U/L (ref 15–37)
Albumin/Globulin Ratio: 0.7 — ABNORMAL LOW (ref 1.1–2.2)
Albumin: 2.9 g/dL — ABNORMAL LOW (ref 3.5–5.0)
Alk Phosphatase: 121 U/L — ABNORMAL HIGH (ref 45–117)
Anion Gap: 8 mmol/L (ref 5–15)
BUN/Creatinine Ratio: 20 (ref 12–20)
BUN: 28 MG/DL — ABNORMAL HIGH (ref 6–20)
CO2: 20 mmol/L — ABNORMAL LOW (ref 21–32)
Calcium: 8.6 MG/DL (ref 8.5–10.1)
Chloride: 110 mmol/L — ABNORMAL HIGH (ref 97–108)
Creatinine: 1.37 MG/DL — ABNORMAL HIGH (ref 0.55–1.02)
Est, Glom Filt Rate: 50 mL/min/{1.73_m2} — ABNORMAL LOW (ref >60)
Globulin: 4.1 g/dL — ABNORMAL HIGH (ref 2.0–4.0)
Glucose: 206 mg/dL — ABNORMAL HIGH (ref 65–100)
Potassium: 4.2 mmol/L (ref 3.5–5.1)
Sodium: 138 mmol/L (ref 136–145)
Total Bilirubin: 0.3 MG/DL (ref 0.2–1.0)
Total Protein: 7 g/dL (ref 6.4–8.2)

## 2023-05-09 LAB — ETHANOL: Ethanol Lvl: <10 MG/DL (ref <10)

## 2023-05-09 LAB — BASIC METABOLIC PANEL
Anion Gap: 5 mmol/L (ref 5–15)
BUN/Creatinine Ratio: 21 — ABNORMAL HIGH (ref 12–20)
BUN: 26 MG/DL — ABNORMAL HIGH (ref 6–20)
CO2: 22 mmol/L (ref 21–32)
Calcium: 8.2 MG/DL — ABNORMAL LOW (ref 8.5–10.1)
Chloride: 112 mmol/L — ABNORMAL HIGH (ref 97–108)
Creatinine: 1.24 MG/DL — ABNORMAL HIGH (ref 0.55–1.02)
Est, Glom Filt Rate: 56 mL/min/{1.73_m2} — ABNORMAL LOW (ref >60)
Glucose: 195 mg/dL — ABNORMAL HIGH (ref 65–100)
Potassium: 4 mmol/L (ref 3.5–5.1)
Sodium: 139 mmol/L (ref 136–145)

## 2023-05-09 LAB — EXTRA TUBES HOLD

## 2023-05-09 LAB — URINALYSIS WITH REFLEX TO CULTURE
Bilirubin, Urine: NEGATIVE
Glucose, Ur: 250 mg/dL — AB
Ketones, Urine: NEGATIVE mg/dL
Leukocyte Esterase, Urine: NEGATIVE
Nitrite, Urine: NEGATIVE
Protein, UA: 300 mg/dL — AB
Specific Gravity, UA: 1.024 (ref 1.003–1.030)
Urobilinogen, Urine: 0.2 EU/dL (ref 0.2–1.0)
pH, Urine: 5.5 (ref 5.0–8.0)

## 2023-05-09 LAB — POCT GLUCOSE
POC Glucose: 194 mg/dL — ABNORMAL HIGH (ref 65–117)
POC Glucose: 162 mg/dL — ABNORMAL HIGH (ref 65–117)
POC Glucose: 170 mg/dL — ABNORMAL HIGH (ref 65–117)
POC Glucose: 149 mg/dL — ABNORMAL HIGH (ref 65–117)

## 2023-05-09 LAB — HEMOGLOBIN A1C
Estimated Avg Glucose: 166 mg/dL
Hemoglobin A1C: 7.4 % — ABNORMAL HIGH (ref 4.0–5.6)

## 2023-05-09 LAB — LACTIC ACID: Lactic Acid, Plasma: 0.3 MMOL/L — ABNORMAL LOW (ref 0.4–2.0)

## 2023-05-09 LAB — PHOSPHORUS: Phosphorus: 3.8 MG/DL (ref 2.6–4.7)

## 2023-05-09 LAB — MAGNESIUM
Magnesium: 2 mg/dL (ref 1.6–2.4)
Magnesium: 1.9 mg/dL (ref 1.6–2.4)

## 2023-05-09 LAB — C-REACTIVE PROTEIN: CRP: <0.29 mg/dL (ref 0.00–0.30)

## 2023-05-09 LAB — CK: Total CK: 108 U/L (ref 26–192)

## 2023-05-09 LAB — LIPASE: Lipase: 33 U/L (ref 13–75)

## 2023-05-09 MED ORDER — ONDANSETRON HCL 4 MG/2ML IJ SOLN
4 | Freq: Once | INTRAMUSCULAR | Status: AC
Start: 2023-05-09 — End: 2023-05-08
  Administered 2023-05-09: 01:00:00 4 mg via INTRAVENOUS

## 2023-05-09 MED ORDER — ENOXAPARIN SODIUM 40 MG/0.4ML IJ SOSY
40 | Freq: Every day | INTRAMUSCULAR | Status: DC
Start: 2023-05-09 — End: 2023-05-14
  Administered 2023-05-09 – 2023-05-12 (×4): 40 mg via SUBCUTANEOUS

## 2023-05-09 MED ORDER — POTASSIUM CHLORIDE ER 10 MEQ PO TBCR
10 | ORAL | Status: DC | PRN
Start: 2023-05-09 — End: 2023-05-09

## 2023-05-09 MED ORDER — INSULIN GLARGINE 100 UNIT/ML SC SOLN
100 | Freq: Once | SUBCUTANEOUS | Status: AC
Start: 2023-05-09 — End: 2023-05-09
  Administered 2023-05-09: 14:00:00 12 [IU] via SUBCUTANEOUS

## 2023-05-09 MED ORDER — BISACODYL 10 MG RE SUPP
10 | Freq: Every day | RECTAL | Status: AC
Start: 2023-05-09 — End: 2023-05-12
  Administered 2023-05-09: 14:00:00 10 mg via RECTAL

## 2023-05-09 MED ORDER — POTASSIUM CHLORIDE 10 MEQ/100ML IV SOLN
10 | INTRAVENOUS | Status: DC | PRN
Start: 2023-05-09 — End: 2023-05-09

## 2023-05-09 MED ORDER — PROCHLORPERAZINE EDISYLATE 10 MG/2ML IJ SOLN
10 | Freq: Four times a day (QID) | INTRAMUSCULAR | Status: AC
Start: 2023-05-09 — End: 2023-05-10
  Administered 2023-05-09 – 2023-05-11 (×8): 10 mg via INTRAVENOUS

## 2023-05-09 MED ORDER — KETOROLAC TROMETHAMINE 15 MG/ML IJ SOLN
15 | Freq: Once | INTRAMUSCULAR | Status: AC
Start: 2023-05-09 — End: 2023-05-08
  Administered 2023-05-09: 01:00:00 15 mg via INTRAVENOUS

## 2023-05-09 MED ORDER — DROPERIDOL 2.5 MG/ML IJ SOLN
2.5 | Freq: Once | INTRAMUSCULAR | Status: AC
Start: 2023-05-09 — End: 2023-05-08
  Administered 2023-05-09: 01:00:00 1.25 mg via INTRAVENOUS

## 2023-05-09 MED ORDER — METOCLOPRAMIDE HCL 5 MG/ML IJ SOLN
5 | Freq: Three times a day (TID) | INTRAMUSCULAR | Status: DC
Start: 2023-05-09 — End: 2023-05-14
  Administered 2023-05-09 – 2023-05-14 (×15): 5 mg via INTRAVENOUS

## 2023-05-09 MED ORDER — NORMAL SALINE FLUSH 0.9 % IV SOLN
0.9 | Freq: Two times a day (BID) | INTRAVENOUS | Status: DC
Start: 2023-05-09 — End: 2023-05-14
  Administered 2023-05-09 – 2023-05-14 (×11): 10 mL via INTRAVENOUS

## 2023-05-09 MED ORDER — SODIUM CHLORIDE 0.9 % IV SOLN
0.9 | INTRAVENOUS | Status: DC | PRN
Start: 2023-05-09 — End: 2023-05-14
  Administered 2023-05-09: 14:00:00 via INTRAVENOUS

## 2023-05-09 MED ORDER — LORAZEPAM 2 MG/ML IJ SOLN
2 | Freq: Four times a day (QID) | INTRAMUSCULAR | Status: DC | PRN
Start: 2023-05-09 — End: 2023-05-14

## 2023-05-09 MED ORDER — GLUCAGON (RDNA) 1 MG IJ KIT
1 | INTRAMUSCULAR | Status: DC | PRN
Start: 2023-05-09 — End: 2023-05-14

## 2023-05-09 MED ORDER — INSULIN GLARGINE 100 UNIT/ML SC SOLN
100 | Freq: Every day | SUBCUTANEOUS | Status: DC
Start: 2023-05-09 — End: 2023-05-11
  Administered 2023-05-10: 14:00:00 24 [IU] via SUBCUTANEOUS

## 2023-05-09 MED ORDER — AMLODIPINE BESYLATE 5 MG PO TABS
5 | Freq: Every day | ORAL | Status: DC
Start: 2023-05-09 — End: 2023-05-14
  Administered 2023-05-09 – 2023-05-14 (×6): 10 mg via ORAL

## 2023-05-09 MED ORDER — INSULIN LISPRO 100 UNIT/ML IJ SOLN
100 | Freq: Four times a day (QID) | INTRAMUSCULAR | Status: DC
Start: 2023-05-09 — End: 2023-05-09

## 2023-05-09 MED ORDER — POLYETHYLENE GLYCOL 3350 17 G PO PACK
17 | Freq: Two times a day (BID) | ORAL | Status: DC
Start: 2023-05-09 — End: 2023-05-11
  Administered 2023-05-09 – 2023-05-11 (×4): 17 g via ORAL

## 2023-05-09 MED ORDER — NORMAL SALINE FLUSH 0.9 % IV SOLN
0.9 | INTRAVENOUS | Status: DC | PRN
Start: 2023-05-09 — End: 2023-05-14

## 2023-05-09 MED ORDER — PROCHLORPERAZINE EDISYLATE 10 MG/2ML IJ SOLN
10 | Freq: Four times a day (QID) | INTRAMUSCULAR | Status: DC | PRN
Start: 2023-05-09 — End: 2023-05-14
  Administered 2023-05-13 (×2): 10 mg via INTRAVENOUS

## 2023-05-09 MED ORDER — DEXTROSE 10 % IV BOLUS
INTRAVENOUS | Status: DC | PRN
Start: 2023-05-09 — End: 2023-05-14

## 2023-05-09 MED ORDER — ACETAMINOPHEN 325 MG PO TABS
325 | Freq: Four times a day (QID) | ORAL | Status: DC | PRN
Start: 2023-05-09 — End: 2023-05-11
  Administered 2023-05-10: 05:00:00 650 mg via ORAL

## 2023-05-09 MED ORDER — MAGNESIUM SULFATE 2000 MG/50 ML IVPB PREMIX
2 | INTRAVENOUS | Status: DC | PRN
Start: 2023-05-09 — End: 2023-05-09

## 2023-05-09 MED ORDER — IOPAMIDOL 76 % IV SOLN
76 | Freq: Once | INTRAVENOUS | Status: AC | PRN
Start: 2023-05-09 — End: 2023-05-08
  Administered 2023-05-09: 02:00:00 100 mL via INTRAVENOUS

## 2023-05-09 MED ORDER — LABETALOL HCL 5 MG/ML IV SOLN
5 | Freq: Four times a day (QID) | INTRAVENOUS | Status: DC | PRN
Start: 2023-05-09 — End: 2023-05-10

## 2023-05-09 MED ORDER — METOCLOPRAMIDE HCL 5 MG/ML IJ SOLN
5 | Freq: Four times a day (QID) | INTRAMUSCULAR | Status: DC
Start: 2023-05-09 — End: 2023-05-09
  Administered 2023-05-09: 04:00:00 10 mg via INTRAVENOUS

## 2023-05-09 MED ORDER — SODIUM CHLORIDE 0.9 % IV SOLN
0.9 | INTRAVENOUS | Status: DC
Start: 2023-05-09 — End: 2023-05-09
  Administered 2023-05-09: 04:00:00 via INTRAVENOUS

## 2023-05-09 MED ORDER — HYDROMORPHONE HCL PF 1 MG/ML IJ SOLN
1 | Freq: Four times a day (QID) | INTRAMUSCULAR | Status: DC | PRN
Start: 2023-05-09 — End: 2023-05-10
  Administered 2023-05-09 – 2023-05-10 (×3): 1 mg via INTRAVENOUS

## 2023-05-09 MED ORDER — PROCHLORPERAZINE EDISYLATE 10 MG/2ML IJ SOLN
10 | Freq: Four times a day (QID) | INTRAMUSCULAR | Status: DC | PRN
Start: 2023-05-09 — End: 2023-05-09
  Administered 2023-05-09: 07:00:00 10 mg via INTRAVENOUS

## 2023-05-09 MED ORDER — FAMOTIDINE (PF) 20 MG/2ML IV SOLN
20 | Freq: Every day | INTRAVENOUS | Status: DC
Start: 2023-05-09 — End: 2023-05-14
  Administered 2023-05-09 – 2023-05-14 (×6): 20 mg via INTRAVENOUS

## 2023-05-09 MED ORDER — HYDROMORPHONE 0.5MG/0.5ML IJ SOLN
1 | Freq: Four times a day (QID) | Status: DC | PRN
Start: 2023-05-09 — End: 2023-05-09

## 2023-05-09 MED ORDER — METOPROLOL SUCCINATE ER 50 MG PO TB24
50 | Freq: Every day | ORAL | Status: DC
Start: 2023-05-09 — End: 2023-05-14
  Administered 2023-05-09 – 2023-05-14 (×6): 50 mg via ORAL

## 2023-05-09 MED ORDER — ACETAMINOPHEN 650 MG RE SUPP
650 | Freq: Four times a day (QID) | RECTAL | Status: DC | PRN
Start: 2023-05-09 — End: 2023-05-11

## 2023-05-09 MED ORDER — SENNA-DOCUSATE SODIUM 8.6-50 MG PO TABS
Freq: Two times a day (BID) | ORAL | Status: DC
Start: 2023-05-09 — End: 2023-05-14
  Administered 2023-05-09 – 2023-05-11 (×5): 1 via ORAL

## 2023-05-09 MED ORDER — ONDANSETRON HCL 4 MG/2ML IJ SOLN
4 | Freq: Four times a day (QID) | INTRAMUSCULAR | Status: DC | PRN
Start: 2023-05-09 — End: 2023-05-14
  Administered 2023-05-09 – 2023-05-13 (×5): 4 mg via INTRAVENOUS

## 2023-05-09 MED ORDER — FLUCONAZOLE IN SODIUM CHLORIDE 200-0.9 MG/100ML-% IV SOLN
Freq: Every day | INTRAVENOUS | Status: DC
Start: 2023-05-09 — End: 2023-05-12
  Administered 2023-05-09 – 2023-05-11 (×3): 200 mg via INTRAVENOUS

## 2023-05-09 MED ORDER — LABETALOL HCL 5 MG/ML IV SOLN
5 | Freq: Four times a day (QID) | INTRAVENOUS | Status: DC | PRN
Start: 2023-05-09 — End: 2023-05-09

## 2023-05-09 MED ORDER — ONDANSETRON 4 MG PO TBDP
4 | Freq: Three times a day (TID) | ORAL | Status: DC | PRN
Start: 2023-05-09 — End: 2023-05-14
  Administered 2023-05-12 – 2023-05-14 (×5): 4 mg via ORAL

## 2023-05-09 MED ORDER — LACTATED RINGERS IV BOLUS
Freq: Once | INTRAVENOUS | Status: AC
Start: 2023-05-09 — End: 2023-05-08
  Administered 2023-05-09: 01:00:00 1000 mL via INTRAVENOUS

## 2023-05-09 MED ORDER — LACTATED RINGERS IV SOLN
INTRAVENOUS | Status: DC
  Administered 2023-05-09 – 2023-05-14 (×11): via INTRAVENOUS

## 2023-05-09 MED ORDER — INSULIN LISPRO 100 UNIT/ML IJ SOLN
100 | Freq: Every evening | INTRAMUSCULAR | Status: DC
Start: 2023-05-09 — End: 2023-05-14

## 2023-05-09 MED ORDER — DEXTROSE 10 % IV SOLN
10 | INTRAVENOUS | Status: DC | PRN
Start: 2023-05-09 — End: 2023-05-14

## 2023-05-09 MED ORDER — GLUCOSE 4 G PO CHEW
4 | ORAL | Status: DC | PRN
Start: 2023-05-09 — End: 2023-05-14

## 2023-05-09 MED ORDER — INSULIN LISPRO 100 UNIT/ML IJ SOLN
100 | Freq: Three times a day (TID) | INTRAMUSCULAR | Status: DC
Start: 2023-05-09 — End: 2023-05-14

## 2023-05-09 MED ORDER — HYDROMORPHONE 0.5MG/0.5ML IJ SOLN
1 | Status: DC | PRN
Start: 2023-05-09 — End: 2023-05-09

## 2023-05-09 MED ORDER — LISINOPRIL 20 MG PO TABS
20 | Freq: Every day | ORAL | Status: DC
Start: 2023-05-09 — End: 2023-05-11
  Administered 2023-05-09 – 2023-05-11 (×3): 40 mg via ORAL

## 2023-05-09 MED ORDER — STERILE WATER FOR INJECTION (MIXTURES ONLY)
1 g | INTRAMUSCULAR | Status: AC
  Administered 2023-05-09 – 2023-05-10 (×2): 1000 mg via INTRAVENOUS

## 2023-05-09 MED ORDER — POTASSIUM BICARB-CITRIC ACID 20 MEQ PO TBEF
20 | ORAL | Status: DC | PRN
Start: 2023-05-09 — End: 2023-05-09

## 2023-05-09 MED ORDER — POLYETHYLENE GLYCOL 3350 17 G PO PACK
17 | Freq: Every day | ORAL | Status: DC | PRN
Start: 2023-05-09 — End: 2023-05-14

## 2023-05-09 MED ORDER — MORPHINE SULFATE (PF) 10 MG/ML IJ SOLN
10 | INTRAMUSCULAR | Status: DC | PRN
Start: 2023-05-09 — End: 2023-05-09
  Administered 2023-05-09 (×2): 6 mg/kg via INTRAVENOUS

## 2023-05-09 MED FILL — PROCHLORPERAZINE EDISYLATE 10 MG/2ML IJ SOLN: 10 MG/2ML | INTRAMUSCULAR | Qty: 2

## 2023-05-09 MED FILL — STOOL SOFTENER/LAXATIVE 50-8.6 MG PO TABS: ORAL | Qty: 1

## 2023-05-09 MED FILL — ONDANSETRON HCL 4 MG/2ML IJ SOLN: 4 MG/2ML | INTRAMUSCULAR | Qty: 2

## 2023-05-09 MED FILL — METOCLOPRAMIDE HCL 5 MG/ML IJ SOLN: 5 MG/ML | INTRAMUSCULAR | Qty: 2

## 2023-05-09 MED FILL — LACTATED RINGERS IV SOLN: INTRAVENOUS | Qty: 1000

## 2023-05-09 MED FILL — CEFTRIAXONE SODIUM 1 G IJ SOLR: 1 g | INTRAMUSCULAR | Qty: 1000

## 2023-05-09 MED FILL — SODIUM CHLORIDE 0.9 % IV SOLN: 0.9 % | INTRAVENOUS | Qty: 1000

## 2023-05-09 MED FILL — KETOROLAC TROMETHAMINE 15 MG/ML IJ SOLN: 15 MG/ML | INTRAMUSCULAR | Qty: 1

## 2023-05-09 MED FILL — DEXTROSE 10 % IV SOLN: 10 % | INTRAVENOUS | Qty: 500

## 2023-05-09 MED FILL — ENOXAPARIN SODIUM 40 MG/0.4ML IJ SOSY: 40 MG/0.4ML | INTRAMUSCULAR | Qty: 0.4

## 2023-05-09 MED FILL — METOPROLOL SUCCINATE ER 50 MG PO TB24: 50 MG | ORAL | Qty: 1

## 2023-05-09 MED FILL — DULCOLAX 10 MG RE SUPP: 10 MG | RECTAL | Qty: 1

## 2023-05-09 MED FILL — LANTUS 100 UNIT/ML SC SOLN: 100 UNIT/ML | SUBCUTANEOUS | Qty: 12

## 2023-05-09 MED FILL — POLYETHYLENE GLYCOL 3350 17 G PO PACK: 17 g | ORAL | Qty: 1

## 2023-05-09 MED FILL — FLUCONAZOLE IN SODIUM CHLORIDE 200-0.9 MG/100ML-% IV SOLN: INTRAVENOUS | Qty: 100

## 2023-05-09 MED FILL — FAMOTIDINE (PF) 20 MG/2ML IV SOLN: 20 MG/2ML | INTRAVENOUS | Qty: 2

## 2023-05-09 MED FILL — DROPERIDOL 2.5 MG/ML IJ SOLN: 2.5 MG/ML | INTRAMUSCULAR | Qty: 2

## 2023-05-09 MED FILL — BD POSIFLUSH 0.9 % IV SOLN: 0.9 % | INTRAVENOUS | Qty: 40

## 2023-05-09 MED FILL — LISINOPRIL 20 MG PO TABS: 20 MG | ORAL | Qty: 2

## 2023-05-09 MED FILL — MORPHINE SULFATE 10 MG/ML IV SOLN: 10 mg/mL | INTRAVENOUS | Qty: 1

## 2023-05-09 MED FILL — ISOVUE-370 76 % IV SOLN: 76 % | INTRAVENOUS | Qty: 100

## 2023-05-09 MED FILL — AMLODIPINE BESYLATE 5 MG PO TABS: 5 MG | ORAL | Qty: 2

## 2023-05-09 MED FILL — HYDROMORPHONE HCL 1 MG/ML IJ SOLN: 1 MG/ML | INTRAMUSCULAR | Qty: 1

## 2023-05-09 NOTE — Consults (Signed)
Melinda Bright, PA-C                       [phone] office             Monday-Friday 8:00 am-4:30 pm  I am not permitted to use "perfect serve" use above for contact, thanks.      Gastroenterology Consultation Note      Admit Date: 05/08/2023  Consult Date: 05/09/2023   I greatly appreciate your asking me to see Melinda [name], thank you very much for the opportunity to participate in her care.    Narrative Assessment and Plan   42 yo F with history of HTN, hogh cholesterol, and type 2 diabetes on insulin, admitted with abd pain, N/V. CT with moderate fecal stasis, otherwise no acute abnormality. S/p cholecystectomy. UA with 1+ bacteria and yeast. H pylori diagnosed on EGD pathology 01/18/23, s/p bismuth, metronidazole, and doxycycline therapy.     Impression:  Abdominal pain  Nausea/vomiting   Fecal stasis    Plan:  Continue current regimen of dulcolax suppositories daily x3 days, Miralax BID, and Sennokot BIDwhile inpatient.    Recommend more aggressive bowel regimen at home, either Miralax and Sennokot daily or trial of Linzess.   Monitor for improvement in symptoms with treating constipation and also with treating UTI with ceftriaxone and diflucan.   Do not restart PPI at discharge. Follow up in office in 2 weeks for H pylori eradication testing. Continue Pepcid.     [name], PA-C  [name], MD     Subjective:     Chief Complaint: abd pain, N/V    History of Present Illness: 42 yo F with history of HTN, high cholesterol, type 2 diabetes, admitted with abdominal pain, nausea, and vomiting. She was last discharged 03/21/23 and was feeling better for a little while, took omeprazole qAM daily and Miralax once every 3 days, but eventually her abd pain, N/V returned. She reports having a soft BM every day. Reports the pain is in the mid epigastrium. She has not taken Senokot or Reglan at home. The only recent change  in her medication is  that she recently decreased her insulin dose due to low blood sugars at home.     Remote Spanish interpreter Sue Lush, [place] # [phone], provided translation services for this encounter.     EGD by Dr. Brent Bulla 01/18/23:   Findings:   Esophagus-normal-appearing esophagus and GE junction   Stomach-normal-appearing stomach  Duodenum-normal examined duodenum to the D2 segment    Colonoscopy by Dr. Brent Bulla 01/19/23:   Findings:   Digital rectal exam-normal perineal skin inspection, small external hemorrhoids, no palpable anal canal lesions     Terminal ileum-healthy-appearing mucosa intubated 10 cm proximal to the IC valve  Cecum-lipomatous appearing ileocecal valve, normal-appearing appendiceal orifice, healthy-appearing cecal base  Ascending colon, transverse colon, descending colon-healthy-appearing mucosa  Sigmoid colon-few rare sigmoid colon diverticula and muscular hypertrophy benign-appearing  Anorectum-healthy appearing mucosa, small internal hemorrhoids nonbleeding    Video Capsule Endoscopy by Dr. Brent Bulla 01/20/23:   Findings  Stomach-gas transit time 3 hours, benign-appearing gastric antral erosion without evidence of active or recent bleeding  Small intestine-small bowel transit time 3 hours and 50 minutes, no focal lesions, clear review secondary to some food debris in the distal one third is visualized small bowel, no evidence of active or recent bleeding lesions  Colon/cecum-grossly unremarkable with limited views    Gastric Emptying Study 02/14/23:   EXAM: Nuclear  gastric emptying study is performed with imaging of the abdomen  following p.o. administration of 0.3 mCi technetium 55m sulfur colloid in  solids.     FINDINGS: The calculated gastric emptying half-time is 50 minutes (normal <90  minutes for solids).     Review of the raw images shows unremarkable distribution of  small bowel  radiotracer activity. There is no scintigraphically apparent gastroesophageal  reflux.      IMPRESSION:  Normal  gastric Emptying.      PCP:  Orlean Bradford, APRN - NP    Past Medical History:   Diagnosis Date    High blood pressure     High cholesterol     Type 2 diabetes mellitus (HCC)     Controled        Past Surgical History:   Procedure Laterality Date    CAPSULE ENDOSCOPY N/A 01/20/2023    ESOPHAGEAL CAPSULE ENDOSCOPY remove at 1624PM performed by Glyn Ade, MD at [facility] ENDOSCOPY    CHOLECYSTECTOMY, LAPAROSCOPIC N/A 02/03/2023    ROBOTIC LAPAROSCOPIC CHOLECYSTECTOMY with Indocyanine green performed by Verdis Frederickson, MD at [facility] MAIN OR    COLONOSCOPY N/A 01/19/2023    COLONOSCOPY DIAGNOSTIC performed by Glyn Ade, MD at [facility] ENDOSCOPY    OTHER SURGICAL HISTORY Left     Rentia attachment    TUBAL LIGATION Bilateral     UPPER GASTROINTESTINAL ENDOSCOPY N/A 01/17/2023    ESOPHAGOGASTRODUODENOSCOPY performed by Glyn Ade, MD at [facility] ENDOSCOPY    UPPER GASTROINTESTINAL ENDOSCOPY N/A 01/17/2023    ESOPHAGOGASTRODUODENOSCOPY BIOPSY performed by Glyn Ade, MD at [facility] ENDOSCOPY    UPPER GASTROINTESTINAL ENDOSCOPY N/A 01/18/2023    ESOPHAGOGASTRODUODENOSCOPY performed by Glyn Ade, MD at [facility] ENDOSCOPY    [place] ABSCESS DRAINAGE PERITONEAL  02/11/2023    [place] ABSCESS DRAINAGE PERITONEAL 02/11/2023 [facility] RAD [place]       Social History     Tobacco Use    Smoking status: Never    Smokeless tobacco: Never   Substance Use Topics    Alcohol use: Never        No family history on file.     No Known Allergies         Home Medications:  Prior to Admission Medications   Prescriptions Last Dose Informant Patient Reported? Taking?   amLODIPine (NORVASC) 10 MG tablet 05/07/2023  No No   Sig: Take 1 tablet by mouth daily   insulin 70-30 (NOVOLIN 70/30) (70-30) 100 UNIT per ML injection vial   No No   Sig: Inject 15 Units into the skin 2 times daily   Patient taking differently: Inject into the skin 2 times daily (before meals) 40 units AM, 25 units PM   lisinopril (PRINIVIL;ZESTRIL) 40 MG tablet 05/07/2023  No No   Sig: Take 1 tablet by mouth  daily   metoclopramide (REGLAN) 5 MG tablet Unknown  No No   Sig: Take 1 tablet by mouth 3 times daily (before meals)   metoprolol succinate (TOPROL XL) 50 MG extended release tablet 05/07/2023  No No   Sig: Take 1 tablet by mouth daily   naloxone (NARCAN) 4 MG/0.1ML LIQD nasal spray 05/07/2023  No No   Sig: 1 spray by Nasal route as needed for Opioid Reversal   omeprazole (PRILOSEC) 10 MG delayed release capsule 05/07/2023  Yes Yes   Sig: Take 1 capsule by mouth daily   ondansetron (ZOFRAN-ODT) 4 MG disintegrating tablet Unknown  No No   Sig: Take 1 tablet by mouth  every 8 hours as needed for Nausea or Vomiting   pantoprazole (PROTONIX) 40 MG tablet Unknown  No No   Sig: Take 1 tablet by mouth 2 times daily (before meals)   polyethylene glycol (GLYCOLAX) 17 g packet Unknown  No No   Sig: Take 1 packet by mouth 2 times daily   sennosides-docusate sodium (SENOKOT-S) 8.6-50 MG tablet Unknown  No No   Sig: Take 1 tablet by mouth in the morning and at bedtime      Facility-Administered Medications: None       Hospital Medications:  Current Facility-Administered Medications   Medication Dose Route Frequency    labetalol (NORMODYNE;TRANDATE) injection 20 mg  20 mg IntraVENous Q6H PRN    famotidine (PEPCID) 20 mg in sodium chloride (PF) 0.9 % 10 mL injection  20 mg IntraVENous Daily    prochlorperazine (COMPAZINE) injection 10 mg  10 mg IntraVENous Q6H    [START ON 05/11/2023] prochlorperazine (COMPAZINE) injection 10 mg  10 mg IntraVENous Q6H PRN    LORazepam (ATIVAN) injection 1 mg  1 mg IntraVENous Q6H PRN    bisacodyl (DULCOLAX) suppository 10 mg  10 mg Rectal Daily    insulin glargine (LANTUS) injection vial 24 Units  24 Units SubCUTAneous Daily    fluconazole (DIFLUCAN) in 0.9 % sodium chloride IVPB 200 mg  200 mg IntraVENous QAM AC    metoprolol succinate (TOPROL XL) extended release tablet 50 mg  50 mg Oral Daily    polyethylene glycol (GLYCOLAX) packet 17 g  17 g Oral BID    sennosides-docusate sodium (SENOKOT-S)  8.6-50 MG tablet 1 tablet  1 tablet Oral BID    amLODIPine (NORVASC) tablet 10 mg  10 mg Oral Daily    lisinopril (PRINIVIL;ZESTRIL) tablet 40 mg  40 mg Oral Daily    lactated ringers IV soln infusion   IntraVENous Continuous    insulin lispro (HUMALOG,ADMELOG) injection vial 0-8 Units  0-8 Units SubCUTAneous TID WC    insulin lispro (HUMALOG,ADMELOG) injection vial 0-4 Units  0-4 Units SubCUTAneous Nightly    glucose chewable tablet 16 g  4 tablet Oral PRN    dextrose bolus 10% 125 mL  125 mL IntraVENous PRN    Or    dextrose bolus 10% 250 mL  250 mL IntraVENous PRN    glucagon injection 1 mg  1 mg SubCUTAneous PRN    dextrose 10 % infusion   IntraVENous Continuous PRN    cefTRIAXone (ROCEPHIN) 1,000 mg in sterile water 10 mL IV syringe  1,000 mg IntraVENous Q24H    metoclopramide (REGLAN) injection 5 mg  5 mg IntraVENous Q8H RT    HYDROmorphone HCl PF (DILAUDID) injection 1 mg  1 mg IntraVENous Q6H PRN    sodium chloride flush 0.9 % injection 5-40 mL  5-40 mL IntraVENous 2 times per day    sodium chloride flush 0.9 % injection 5-40 mL  5-40 mL IntraVENous PRN    0.9 % sodium chloride infusion   IntraVENous PRN    enoxaparin (LOVENOX) injection 40 mg  40 mg SubCUTAneous Daily    ondansetron (ZOFRAN-ODT) disintegrating tablet 4 mg  4 mg Oral Q8H PRN    Or    ondansetron (ZOFRAN) injection 4 mg  4 mg IntraVENous Q6H PRN    polyethylene glycol (GLYCOLAX) packet 17 g  17 g Oral Daily PRN    acetaminophen (TYLENOL) tablet 650 mg  650 mg Oral Q6H PRN    Or    acetaminophen (TYLENOL) suppository 650  mg  650 mg Rectal Q6H PRN       Review of Systems: Admission ROS by Norton Pastel, MD from 05/08/2023 were reviewed with the patient and changes (other than per HPI) include: none      Objective:     Physical Exam:  Vitals:    05/09/23 1430   BP: (!) 173/93   Pulse: 94   Resp: 20   Temp:    SpO2:      SpO2 Readings from Last 6 Encounters:   05/09/23 99%   03/23/23 97%   02/17/23 95%   02/14/23 98%   02/04/23 97%   01/20/23 98%           Intake/Output Summary (Last 24 hours) at 05/09/2023 1448  Last data filed at 05/09/2023 1107  Gross per 24 hour   Intake 50 ml   Output --   Net 50 ml      General: no distress, comfortable  Skin:  No rash or palpable dermatologic mass lesions  HEENT: Pupils equal, sclera anicteric, oropharynx with no gross lesions  Cardiovascular: No abnormal audible heart sounds, well perfused, no edema  Respiratory:  No abnormal audible breath sounds, normal respiratory effort, no throacic deformity  GI: Abdomen nondistended, +epigastric/periumbilical TTP, no mass, no free fluid, no rebound or guarding.  Musculoskeletal:  No skeletal deformity nor acute arthritis noted.  Neurological:  Motor and sensory function intact in upper extremeties  Psychiatric:  Flat affect, memory intact, appears to have moderate insight into current illness    Laboratory:    Recent Results (from the past 24 hour(s))   CBC with Auto Differential    Collection Time: 05/08/23  8:32 PM   Result Value Ref Range    WBC 11.1 (H) 3.6 - 11.0 K/uL    RBC 4.02 3.80 - 5.20 M/uL    Hemoglobin 11.7 11.5 - 16.0 g/dL    Hematocrit 64.4 (L) 35.0 - 47.0 %    MCV 84.3 80.0 - 99.0 FL    MCH 29.1 26.0 - 34.0 PG    MCHC 34.5 30.0 - 36.5 g/dL    RDW 03.4 74.2 - 59.5 %    Platelets 432 (H) 150 - 400 K/uL    MPV 8.9 8.9 - 12.9 FL    Nucleated RBCs 0.0 0 PER 100 WBC    nRBC 0.00 0.00 - 0.01 K/uL    Neutrophils % 64 32 - 75 %    Lymphocytes % 26 12 - 49 %    Monocytes % 6 5 - 13 %    Eosinophils % 3 0 - 7 %    Basophils % 1 0 - 1 %    Immature Granulocytes % 0 0.0 - 0.5 %    Neutrophils Absolute 7.1 1.8 - 8.0 K/UL    Lymphocytes Absolute 2.8 0.8 - 3.5 K/UL    Monocytes Absolute 0.6 0.0 - 1.0 K/UL    Eosinophils Absolute 0.4 0.0 - 0.4 K/UL    Basophils Absolute 0.1 0.0 - 0.1 K/UL    Immature Granulocytes Absolute 0.0 0.00 - 0.04 K/UL    Differential Type AUTOMATED     Comprehensive Metabolic Panel    Collection Time: 05/08/23  8:32 PM   Result Value Ref Range    Sodium 138  136 - 145 mmol/L    Potassium 4.2 3.5 - 5.1 mmol/L    Chloride 110 (H) 97 - 108 mmol/L    CO2 20 (L) 21 - 32 mmol/L  Anion Gap 8 5 - 15 mmol/L    Glucose 206 (H) 65 - 100 mg/dL    BUN 28 (H) 6 - 20 MG/DL    Creatinine 9.62 (H) 0.55 - 1.02 MG/DL    BUN/Creatinine Ratio 20 12 - 20      Est, Glom Filt Rate 50 (L) >60 ml/min/1.62m2    Calcium 8.6 8.5 - 10.1 MG/DL    Total Bilirubin 0.3 0.2 - 1.0 MG/DL    ALT 29 12 - 78 U/L    AST 22 15 - 37 U/L    Alk Phosphatase 121 (H) 45 - 117 U/L    Total Protein 7.0 6.4 - 8.2 g/dL    Albumin 2.9 (L) 3.5 - 5.0 g/dL    Globulin 4.1 (H) 2.0 - 4.0 g/dL    Albumin/Globulin Ratio 0.7 (L) 1.1 - 2.2     Lipase    Collection Time: 05/08/23  8:32 PM   Result Value Ref Range    Lipase 33 13 - 75 U/L   Magnesium    Collection Time: 05/08/23  8:32 PM   Result Value Ref Range    Magnesium 2.0 1.6 - 2.4 mg/dL   HCG Qualitative, Serum    Collection Time: 05/08/23  8:32 PM   Result Value Ref Range    Preg, Serum Negative NEG     ETOH    Collection Time: 05/08/23  9:24 PM   Result Value Ref Range    Ethanol Lvl <10 <10 MG/DL   Urinalysis with Reflex to Culture    Collection Time: 05/08/23 11:29 PM    Specimen: Urine   Result Value Ref Range    Color, UA YELLOW/STRAW      Appearance CLEAR CLEAR      Specific Gravity, UA 1.024 1.003 - 1.030      pH, Urine 5.5 5.0 - 8.0      Protein, UA 300 (A) NEG mg/dL    Glucose, Ur 952 (A) NEG mg/dL    Ketones, Urine Negative NEG mg/dL    Bilirubin, Urine Negative NEG      Blood, Urine MODERATE (A) NEG      Urobilinogen, Urine 0.2 0.2 - 1.0 EU/dL    Nitrite, Urine Negative NEG      Leukocyte Esterase, Urine Negative NEG      WBC, UA 0-4 0 - 4 /hpf    RBC, UA 5-10 0 - 5 /hpf    Epithelial Cells, UA FEW FEW /lpf    BACTERIA, URINE 1+ (A) NEG /hpf    Urine Culture if Indicated CULTURE NOT INDICATED BY UA RESULT CNI      Yeast, UA PRESENT (A) NEG     EKG 12 Lead    Collection Time: 05/09/23 12:14 AM   Result Value Ref Range    Ventricular Rate 111 BPM    Atrial Rate  111 BPM    P-R Interval 138 ms    QRS Duration 82 ms    Q-T Interval 372 ms    QTc Calculation (Bazett) 505 ms    P Axis 53 degrees    R Axis 254 degrees    T Axis 56 degrees    Diagnosis       Sinus tachycardia  Right superior axis deviation  Inferior infarct , age undetermined  Anterior infarct (cited on or before 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)  Abnormal ECG  When compared with ECG of 16-Mar-2023 08:10,  Inferior infarct is now present     CBC with Auto  Differential    Collection Time: 05/09/23  5:03 AM   Result Value Ref Range    WBC 9.3 3.6 - 11.0 K/uL    RBC 3.61 (L) 3.80 - 5.20 M/uL    Hemoglobin 10.4 (L) 11.5 - 16.0 g/dL    Hematocrit 41.3 (L) 35.0 - 47.0 %    MCV 85.6 80.0 - 99.0 FL    MCH 28.8 26.0 - 34.0 PG    MCHC 33.7 30.0 - 36.5 g/dL    RDW 24.4 01.0 - 27.2 %    Platelets 385 150 - 400 K/uL    MPV 9.0 8.9 - 12.9 FL    Nucleated RBCs 0.0 0 PER 100 WBC    nRBC 0.00 0.00 - 0.01 K/uL    Neutrophils % 77 (H) 32 - 75 %    Lymphocytes % 16 12 - 49 %    Monocytes % 5 5 - 13 %    Eosinophils % 1 0 - 7 %    Basophils % 1 0 - 1 %    Immature Granulocytes % 0 0.0 - 0.5 %    Neutrophils Absolute 7.1 1.8 - 8.0 K/UL    Lymphocytes Absolute 1.5 0.8 - 3.5 K/UL    Monocytes Absolute 0.5 0.0 - 1.0 K/UL    Eosinophils Absolute 0.1 0.0 - 0.4 K/UL    Basophils Absolute 0.1 0.0 - 0.1 K/UL    Immature Granulocytes Absolute 0.0 0.00 - 0.04 K/UL    Differential Type AUTOMATED     Lactic Acid    Collection Time: 05/09/23  5:03 AM   Result Value Ref Range    Lactic Acid, Plasma 0.3 (L) 0.4 - 2.0 MMOL/L   Magnesium    Collection Time: 05/09/23  5:03 AM   Result Value Ref Range    Magnesium 1.9 1.6 - 2.4 mg/dL   Phosphorus    Collection Time: 05/09/23  5:03 AM   Result Value Ref Range    Phosphorus 3.8 2.6 - 4.7 MG/DL   C-Reactive Protein    Collection Time: 05/09/23  5:03 AM   Result Value Ref Range    CRP <0.29 0.00 - 0.30 mg/dL   CK    Collection Time: 05/09/23  5:03 AM   Result Value Ref Range    Total CK 108 26 - 192 U/L   Hemoglobin A1C     Collection Time: 05/09/23  5:03 AM   Result Value Ref Range    Hemoglobin A1C 7.4 (H) 4.0 - 5.6 %    Estimated Avg Glucose 166 mg/dL   Basic Metabolic Panel    Collection Time: 05/09/23  5:03 AM   Result Value Ref Range    Sodium 139 136 - 145 mmol/L    Potassium 4.0 3.5 - 5.1 mmol/L    Chloride 112 (H) 97 - 108 mmol/L    CO2 22 21 - 32 mmol/L    Anion Gap 5 5 - 15 mmol/L    Glucose 195 (H) 65 - 100 mg/dL    BUN 26 (H) 6 - 20 MG/DL    Creatinine 5.36 (H) 0.55 - 1.02 MG/DL    BUN/Creatinine Ratio 21 (H) 12 - 20      Est, Glom Filt Rate 56 (L) >60 ml/min/1.49m2    Calcium 8.2 (L) 8.5 - 10.1 MG/DL   Extra Tubes Hold    Collection Time: 05/09/23  5:03 AM   Result Value Ref Range    Specimen HOld 1sst     Comment:  Add-on orders for these samples will be processed based on acceptable specimen integrity and analyte stability, which may vary by analyte.   POCT Glucose    Collection Time: 05/09/23  5:07 AM   Result Value Ref Range    POC Glucose 194 (H) 65 - 117 mg/dL    Performed by: Germain Osgood    POCT Glucose    Collection Time: 05/09/23  7:30 AM   Result Value Ref Range    POC Glucose 162 (H) 65 - 117 mg/dL    Performed by: Romie Jumper  (ED Float Pool)    POCT Glucose    Collection Time: 05/09/23 11:10 AM   Result Value Ref Range    POC Glucose 170 (H) 65 - 117 mg/dL    Performed by: Stephani Police          Assessment/Plan:     Principal Problem:    Intractable nausea and vomiting  Resolved Problems:    * No resolved hospital problems. *       See above narrative for full detail.

## 2023-05-09 NOTE — Plan of Care (Signed)
Problem: Discharge Planning  Goal: Discharge to home or other facility with appropriate resources  Outcome: Progressing

## 2023-05-09 NOTE — Consults (Signed)
Session ID: [id number]  Language: Spanish  Interpreter ID: #[id number]  Interpreter Name: Sue Lush

## 2023-05-09 NOTE — Progress Notes (Signed)
InterpreterCarley Hammed [phone]

## 2023-05-09 NOTE — Progress Notes (Signed)
Interpreter-Abby # [id number]

## 2023-05-09 NOTE — Consults (Signed)
Session ID: [id number]  Language: Spanish  Interpreter ID: #[id number]  Interpreter Name: Carley Hammed

## 2023-05-09 NOTE — Care Coordination-Inpatient (Addendum)
Care Management Initial Assessment       RUR: 26% High  Readmission? No  1st IM letter given? NA  1st [employer] letter given: NA      Melinda [name] presented to the ED on 05/08/23 with abdominal pain and emesis. Melinda [name] has history of high blood pressure, high cholesterol, and type 2 diabetes.     CM met with Melinda [name] to inform of CM role and to assess needs. Melinda [name] lives at home with her husband and daughter.  Melinda [name] is independent at baseline with no DME use. CM to monitor for any needs. Family will transport at DC.     [employer] Language Interpreter-Edgard # [id number]     05/09/23 1324   Service Assessment   Melinda [name] Orientation Alert and Oriented;Person;Place;Situation;Self   Cognition Alert   History Provided By Melinda [name]   Primary Caregiver Self   Support Systems Spouse/Significant Other;Children   PCP Verified by CM Yes  ([facility])   Last Visit to PCP Within last 3 months   Prior Functional Level Independent in ADLs/IADLs   Current Functional Level Independent in ADLs/IADLs   Can Melinda [name] return to prior living arrangement Yes   Ability to make needs known: Good   Family able to assist with home care needs: Yes   Financial Resources [employer] None   Social/Functional History   Lives With Spouse   Type of Home House   Bathroom Equipment None   Home Equipment None   Receives Help From Family   ADL Assistance Independent   Homemaking Assistance Independent   Ambulation Assistance Independent   Transfer Assistance Independent   Mode of Engineer, water   Occupation Unemployed   Discharge Planning   Type of Residence House   Living Arrangements Spouse/Significant Other;Children   Current Services Prior To Admission None   Potential Assistance Needed N/A   DME Ordered? No   Potential Assistance Purchasing Medications No  ([facility])   Type of Home Care Services None   Melinda [name] expects to be discharged to: [facility] At/After Discharge   Transition of Care Consult (CM Consult) N/A    Services At/After Discharge None   Mode of Transport at Discharge Other (see comment)  (Family)   Confirm Follow Up Transport Family   Condition of Participation: Discharge Planning   The Plan for Transition of Care is related to the following treatment goals: Home     Suzan Slick [place], [place]  [phone]

## 2023-05-09 NOTE — Progress Notes (Signed)
[facility]  [address] [place], [place] [address]  [phone]        Hospitalist Progress Note      NAME: Melinda [name]   DOB:  01-Jul-1981  MRM:  [id number]    Date/Time of service: 05/09/2023  8:40 AM       Subjective:     Chief Complaint:  Patient was personally seen and examined by me during this time period.  Chart reviewed.  Still with diffused abd pain, nausea, vomiting       Objective:       Vitals:       Last 24hrs VS reviewed since prior progress note. Most recent are:    Vitals:    05/09/23 0744   BP: (!) 149/98   Pulse:    Resp: 14   Temp: 98.5 F (36.9 C)   SpO2: 100%     SpO2 Readings from Last 6 Encounters:   05/09/23 100%   03/23/23 97%   02/17/23 95%   02/14/23 98%   02/04/23 97%   01/20/23 98%        No intake or output data in the 24 hours ending 05/09/23 0840     Exam:     Physical Exam:    Gen:  Well-developed, well-nourished, in mild distress  HEENT:  Pink conjunctivae, PERRL, hearing intact to voice, dry mucous membranes  Neck:  Supple, without masses, thyroid non-tender  Resp:  No accessory muscle use, clear breath sounds without wheezes rales or rhonchi  Card:  No murmurs, normal S1, S2 without thrills, bruits or peripheral edema  Abd:  Soft, generalized tenderness, non-distended, decreased BS  Musc:  No cyanosis or clubbing  Skin:  No rashes   Neuro:  Cranial nerves 3-12 are grossly intact, follows commands appropriately  Psych:  poor insight, oriented to person, place and time, alert.  Spanish speaking    Medications Reviewed: (see below)    Lab Data Reviewed: (see below)    ______________________________________________________________________    Medications:     Current Facility-Administered Medications   Medication Dose Route Frequency    labetalol (NORMODYNE;TRANDATE) injection 20 mg  20 mg IntraVENous Q6H PRN    famotidine (PEPCID) 20 mg in sodium chloride (PF) 0.9 % 10 mL injection  20 mg IntraVENous Daily    prochlorperazine (COMPAZINE)  injection 10 mg  10 mg IntraVENous Q6H    [START ON 05/11/2023] prochlorperazine (COMPAZINE) injection 10 mg  10 mg IntraVENous Q6H PRN    LORazepam (ATIVAN) injection 1 mg  1 mg IntraVENous Q6H PRN    bisacodyl (DULCOLAX) suppository 10 mg  10 mg Rectal Daily    insulin glargine (LANTUS) injection vial 24 Units  24 Units SubCUTAneous Daily    fluconazole (DIFLUCAN) in 0.9 % sodium chloride IVPB 200 mg  200 mg IntraVENous QAM AC    metoprolol succinate (TOPROL XL) extended release tablet 50 mg  50 mg Oral Daily    polyethylene glycol (GLYCOLAX) packet 17 g  17 g Oral BID    sennosides-docusate sodium (SENOKOT-S) 8.6-50 MG tablet 1 tablet  1 tablet Oral BID    amLODIPine (NORVASC) tablet 10 mg  10 mg Oral Daily    lisinopril (PRINIVIL;ZESTRIL) tablet 40 mg  40 mg Oral Daily    lactated ringers IV soln infusion   IntraVENous Continuous    insulin lispro (HUMALOG,ADMELOG) injection vial 0-8 Units  0-8 Units SubCUTAneous TID WC    insulin lispro (HUMALOG,ADMELOG) injection  vial 0-4 Units  0-4 Units SubCUTAneous Nightly    glucose chewable tablet 16 g  4 tablet Oral PRN    dextrose bolus 10% 125 mL  125 mL IntraVENous PRN    Or    dextrose bolus 10% 250 mL  250 mL IntraVENous PRN    glucagon injection 1 mg  1 mg SubCUTAneous PRN    dextrose 10 % infusion   IntraVENous Continuous PRN    cefTRIAXone (ROCEPHIN) 1,000 mg in sterile water 10 mL IV syringe  1,000 mg IntraVENous Q24H    metoclopramide (REGLAN) injection 5 mg  5 mg IntraVENous Q8H RT    HYDROmorphone HCl PF (DILAUDID) injection 1 mg  1 mg IntraVENous Q6H PRN    sodium chloride flush 0.9 % injection 5-40 mL  5-40 mL IntraVENous 2 times per day    sodium chloride flush 0.9 % injection 5-40 mL  5-40 mL IntraVENous PRN    0.9 % sodium chloride infusion   IntraVENous PRN    enoxaparin (LOVENOX) injection 40 mg  40 mg SubCUTAneous Daily    ondansetron (ZOFRAN-ODT) disintegrating tablet 4 mg  4 mg Oral Q8H PRN    Or    ondansetron (ZOFRAN) injection 4 mg  4 mg  IntraVENous Q6H PRN    polyethylene glycol (GLYCOLAX) packet 17 g  17 g Oral Daily PRN    acetaminophen (TYLENOL) tablet 650 mg  650 mg Oral Q6H PRN    Or    acetaminophen (TYLENOL) suppository 650 mg  650 mg Rectal Q6H PRN          Lab Review:     Recent Labs     05/08/23  2032 05/09/23  0503   WBC 11.1* 9.3   HGB 11.7 10.4*   HCT 33.9* 30.9*   PLT 432* 385     Recent Labs     05/08/23  2032 05/09/23  0503   NA 138 139   K 4.2 4.0   CL 110* 112*   CO2 20* 22   BUN 28* 26*   MG 2.0 1.9   PHOS  --  3.8   ALT 29  --      No results found for: "GLUCPOC"       Assessment / Plan:     42 yo hx of HTN, DM, CKD 3, cholecystectomy, presented w/ intractable abd pain, N/V, suspected gastroparesis     1) Acute on chronic abd pain/N/V: likely due to gastroparesis, constipation.  Prior abd CT, EGD/colon, gastric emptying studies unrevealing.  Will start IV reglan, IV antiemetics, IV dilaudid prn severe pain.  Consult GI    2) Constipation: cont bowel regimen.  Enema prn    3) UTI: will monitor urine Cx.  Start IV CTX, flucon    4) HTN: cont norvasc, holding lisinopril due to renal insuff.  Use IV labetalol prn    5) DM type 2: check A1C.  Cont lantus, SSI.  Consult DM educator    6) CKD 3: monitor renal function    **Prior records, notes, labs, radiology, and medications reviewed in [facility]**    Total time spent with patient care: 8 Minutes **I personally saw and examined the patient during this time period**                 Care Plan discussed with: Patient, nursing, daughter     Discussed:  Care Plan    Prophylaxis:  Lovenox    Disposition:  Home w/Family  ___________________________________________________    Attending Physician: Paulo Fruit, MD

## 2023-05-09 NOTE — Consults (Signed)
Session ID: [id number]  Language: Spanish  Interpreter ID: #[id number]  Interpreter Name: Deloria Lair

## 2023-05-09 NOTE — Progress Notes (Signed)
Patient/caregivers speak Spanish as their preferred language for their healthcare communication. For safe communication, use the [employer] interpreter carts or call:    Maria Claudia Oystese   Senior Interpreter-Navigator [phone]  General phone: [phone] ( [phone])  Email: [email or web address]    Please always document the use of interpreting services (Interpreter's ID number) in your clinical notes.    Our interpreters are available for team members working with limited  English proficient (LEP) patients remotely, via phone or video or in person (if needed for special cases).

## 2023-05-09 NOTE — ED Notes (Signed)
TRANSFER - OUT REPORT:    Verbal report given to Gretchen on Melinda [name]  being transferred to [facility] for routine progression of patient care       Report consisted of patient's Situation, Background, Assessment and   Recommendations(SBAR).     Information from the following report(s) Nurse Handoff Report, ED SBAR, MAR, Recent Results, Quality Measures, and Neuro Assessment was reviewed with the receiving nurse.    Kinder Fall Assessment:    Presents to emergency department  because of falls (Syncope, seizure, or loss of consciousness): No  Age > 70: No  Altered Mental Status, Intoxication with alcohol or substance confusion (Disorientation, impaired judgment, poor safety awaremess, or inability to follow instructions): No  Impaired Mobility: Ambulates or transfers with assistive devices or assistance; Unable to ambulate or transer.: No  Nursing Judgement: No          Lines:   Peripheral IV 05/08/23 Left Antecubital (Active)       Peripheral IV 05/08/23 Posterior;Right Wrist (Active)        Opportunity for questions and clarification was provided.      Patient transported with:  Monitor and Tech

## 2023-05-10 LAB — POCT GLUCOSE
POC Glucose: 112 mg/dL (ref 65–117)
POC Glucose: 123 mg/dL — ABNORMAL HIGH (ref 65–117)
POC Glucose: 128 mg/dL — ABNORMAL HIGH (ref 65–117)
POC Glucose: 148 mg/dL — ABNORMAL HIGH (ref 65–117)
POC Glucose: 163 mg/dL — ABNORMAL HIGH (ref 65–117)

## 2023-05-10 LAB — BASIC METABOLIC PANEL
Anion Gap: 7 mmol/L (ref 5–15)
BUN/Creatinine Ratio: 18 (ref 12–20)
BUN: 26 MG/DL — ABNORMAL HIGH (ref 6–20)
CO2: 21 mmol/L (ref 21–32)
Calcium: 8.1 MG/DL — ABNORMAL LOW (ref 8.5–10.1)
Chloride: 111 mmol/L — ABNORMAL HIGH (ref 97–108)
Creatinine: 1.42 MG/DL — ABNORMAL HIGH (ref 0.55–1.02)
Est, Glom Filt Rate: 48 mL/min/{1.73_m2} — ABNORMAL LOW (ref >60)
Glucose: 169 mg/dL — ABNORMAL HIGH (ref 65–100)
Potassium: 3.8 mmol/L (ref 3.5–5.1)
Sodium: 139 mmol/L (ref 136–145)

## 2023-05-10 LAB — PROTEIN / CREATININE RATIO, URINE
Creatinine, Ur: 88 mg/dL
PROTEIN/CREAT RATIO URINE RAN: 5.8
Protein, Urine, Random: 514 mg/dL — ABNORMAL HIGH (ref 0.0–11.9)

## 2023-05-10 LAB — CULTURE, URINE: Colony count: 6000

## 2023-05-10 LAB — MAGNESIUM: Magnesium: 1.9 mg/dL (ref 1.6–2.4)

## 2023-05-10 LAB — PHOSPHORUS: Phosphorus: 3.6 MG/DL (ref 2.6–4.7)

## 2023-05-10 LAB — URINE CULTURE HOLD SAMPLE

## 2023-05-10 MED ORDER — LABETALOL HCL 100 MG PO TABS
100 | Freq: Three times a day (TID) | ORAL | Status: DC | PRN
Start: 2023-05-10 — End: 2023-05-14

## 2023-05-10 MED ORDER — OXYCODONE HCL 5 MG PO TABS
5 | ORAL | Status: DC | PRN
Start: 2023-05-10 — End: 2023-05-11
  Administered 2023-05-10 – 2023-05-11 (×4): 5 mg via ORAL

## 2023-05-10 MED FILL — PROCHLORPERAZINE EDISYLATE 10 MG/2ML IJ SOLN: 10 MG/2ML | INTRAMUSCULAR | Qty: 2

## 2023-05-10 MED FILL — POLYETHYLENE GLYCOL 3350 17 G PO PACK: 17 g | ORAL | Qty: 1

## 2023-05-10 MED FILL — ONDANSETRON HCL 4 MG/2ML IJ SOLN: 4 MG/2ML | INTRAMUSCULAR | Qty: 2

## 2023-05-10 MED FILL — STOOL SOFTENER/LAXATIVE 50-8.6 MG PO TABS: ORAL | Qty: 1

## 2023-05-10 MED FILL — LANTUS 100 UNIT/ML SC SOLN: 100 UNIT/ML | SUBCUTANEOUS | Qty: 24

## 2023-05-10 MED FILL — AMLODIPINE BESYLATE 5 MG PO TABS: 5 MG | ORAL | Qty: 2

## 2023-05-10 MED FILL — METOCLOPRAMIDE HCL 5 MG/ML IJ SOLN: 5 MG/ML | INTRAMUSCULAR | Qty: 2

## 2023-05-10 MED FILL — OXYCODONE HCL 5 MG PO TABS: 5 MG | ORAL | Qty: 1

## 2023-05-10 MED FILL — ACETAMINOPHEN 325 MG PO TABS: 325 MG | ORAL | Qty: 2

## 2023-05-10 MED FILL — ENOXAPARIN SODIUM 40 MG/0.4ML IJ SOSY: 40 MG/0.4ML | INTRAMUSCULAR | Qty: 0.4

## 2023-05-10 MED FILL — CEFTRIAXONE SODIUM 1 G IJ SOLR: 1 g | INTRAMUSCULAR | Qty: 1000

## 2023-05-10 MED FILL — DULCOLAX 10 MG RE SUPP: 10 MG | RECTAL | Qty: 1

## 2023-05-10 MED FILL — FLUCONAZOLE IN SODIUM CHLORIDE 200-0.9 MG/100ML-% IV SOLN: INTRAVENOUS | Qty: 100

## 2023-05-10 MED FILL — LISINOPRIL 20 MG PO TABS: 20 MG | ORAL | Qty: 2

## 2023-05-10 MED FILL — FAMOTIDINE (PF) 20 MG/2ML IV SOLN: 20 MG/2ML | INTRAVENOUS | Qty: 2

## 2023-05-10 MED FILL — HYDROMORPHONE HCL 1 MG/ML IJ SOLN: 1 MG/ML | INTRAMUSCULAR | Qty: 1

## 2023-05-10 MED FILL — METOPROLOL SUCCINATE ER 50 MG PO TB24: 50 MG | ORAL | Qty: 1

## 2023-05-10 NOTE — Progress Notes (Signed)
Melinda Bright, PA-C                       [phone] office             Monday-Friday 8:00 am-4:30 pm  I am not permitted to use "perfect serve" use above for contact, thanks.        Gastroenterology Progress Note    May 10, 2023  Admit Date: 05/08/2023         Narrative Assessment and Plan   42 yo F with history of HTN, hogh cholesterol, and type 2 diabetes on insulin, admitted with abd pain, N/V. CT with moderate fecal stasis, otherwise no acute abnormality. S/p cholecystectomy. UA with 1+ bacteria and yeast. H pylori diagnosed on EGD pathology 01/18/23, s/p bismuth, metronidazole, and doxycycline therapy. Labs today with elevated creatinine 1.42, leukocytosis had resolved yesterday, today's CBC in process. Reports a few liquid bowel movements this morning, no solid ones yet.      Impression:  Abdominal pain  Nausea/vomiting   Fecal stasis     Plan:  Continue current regimen of dulcolax suppositories daily x3 days, Miralax BID, and Sennokot BID for the time being until some solid stool is seen.   Monitor for improvement in symptoms with treating constipation and also with treating UTI with ceftriaxone and diflucan.   Continue with supportive care including antiemetics, opioid pain medications may worsen bowel motility.   Do not restart PPI at discharge. Follow up in office in 2 weeks for H pylori eradication testing. Continue Pepcid.     Melinda Harder, PA-C    Plan for bowel regimen to see if this leads to symptomatic improvement.  Melinda Parma, MD     Subjective:   Chief Complaint: abd pain, N/V     History of Present Illness: 42 yo F with history of HTN, high cholesterol, type 2 diabetes, admitted with abdominal pain, nausea, and vomiting. She was last discharged 03/21/23 and was feeling better for a little while, took omeprazole qAM daily and Miralax once every 3 days, but eventually her abd pain, N/V  returned. She reports having a soft BM every day. Reports the pain is in the mid epigastrium. She has not taken Senokot or Reglan at home. The only recent change in her medication is  that she recently decreased her insulin dose due to low blood sugars at home.     05/10/23 : She reports persistent abdominal pain, N/V, has eaten some Jello. Reports a few clear liquid bowel movements this morning, no solid stool yet. Remote Spanish interpreter Rolm Gala, [place] # [phone], provided translation services for this encounter.       ROS:  The previous review of systems on initial consultation / H&P is noted and reviewed.  Specific changes noted above in HPI.    Current Medications:     Current Facility-Administered Medications   Medication Dose Route Frequency    labetalol (NORMODYNE) tablet 100 mg  100 mg Oral Q8H PRN    famotidine (PEPCID) 20 mg in sodium chloride (PF) 0.9 % 10 mL injection  20 mg IntraVENous Daily    prochlorperazine (COMPAZINE) injection 10 mg  10 mg IntraVENous Q6H    [START ON 05/11/2023] prochlorperazine (COMPAZINE) injection 10 mg  10 mg IntraVENous Q6H PRN    LORazepam (ATIVAN) injection 1 mg  1 mg IntraVENous Q6H PRN    bisacodyl (DULCOLAX) suppository 10 mg  10 mg Rectal Daily    insulin glargine (  LANTUS) injection vial 24 Units  24 Units SubCUTAneous Daily    fluconazole (DIFLUCAN) in 0.9 % sodium chloride IVPB 200 mg  200 mg IntraVENous QAM AC    metoprolol succinate (TOPROL XL) extended release tablet 50 mg  50 mg Oral Daily    polyethylene glycol (GLYCOLAX) packet 17 g  17 g Oral BID    sennosides-docusate sodium (SENOKOT-S) 8.6-50 MG tablet 1 tablet  1 tablet Oral BID    amLODIPine (NORVASC) tablet 10 mg  10 mg Oral Daily    lisinopril (PRINIVIL;ZESTRIL) tablet 40 mg  40 mg Oral Daily    lactated ringers IV soln infusion   IntraVENous Continuous    insulin lispro (HUMALOG,ADMELOG) injection vial 0-8 Units  0-8 Units SubCUTAneous TID WC    insulin lispro (HUMALOG,ADMELOG) injection vial 0-4 Units  0-4  Units SubCUTAneous Nightly    glucose chewable tablet 16 g  4 tablet Oral PRN    dextrose bolus 10% 125 mL  125 mL IntraVENous PRN    Or    dextrose bolus 10% 250 mL  250 mL IntraVENous PRN    glucagon injection 1 mg  1 mg SubCUTAneous PRN    dextrose 10 % infusion   IntraVENous Continuous PRN    metoclopramide (REGLAN) injection 5 mg  5 mg IntraVENous Q8H RT    HYDROmorphone HCl PF (DILAUDID) injection 1 mg  1 mg IntraVENous Q6H PRN    sodium chloride flush 0.9 % injection 5-40 mL  5-40 mL IntraVENous 2 times per day    sodium chloride flush 0.9 % injection 5-40 mL  5-40 mL IntraVENous PRN    0.9 % sodium chloride infusion   IntraVENous PRN    enoxaparin (LOVENOX) injection 40 mg  40 mg SubCUTAneous Daily    ondansetron (ZOFRAN-ODT) disintegrating tablet 4 mg  4 mg Oral Q8H PRN    Or    ondansetron (ZOFRAN) injection 4 mg  4 mg IntraVENous Q6H PRN    polyethylene glycol (GLYCOLAX) packet 17 g  17 g Oral Daily PRN    acetaminophen (TYLENOL) tablet 650 mg  650 mg Oral Q6H PRN    Or    acetaminophen (TYLENOL) suppository 650 mg  650 mg Rectal Q6H PRN       Objective:     VITALS:   Last 24hrs VS reviewed since prior progress note. Most recent are:  Vitals:    05/10/23 1046   BP: 132/79   Pulse: 92   Resp: (!) 1   Temp: 98.1 F (36.7 C)   SpO2: 95%     Temp (24hrs), Avg:98 F (36.7 C), Min:97.5 F (36.4 C), Max:98.4 F (36.9 C)      Intake/Output Summary (Last 24 hours) at 05/10/2023 1051  Last data filed at 05/10/2023 1032  Gross per 24 hour   Intake 2100.56 ml   Output --   Net 2100.56 ml       EXAM:  General:  Comfortable, no distress    HEENT:  Atraumatic skull, pupils equal  Lungs:   No abnormal audible breath sounds. Speaking in complete sentences  Heart:   No abnormal audible heart sounds.  Well perfused  Abdomen:  Non-distended, +epigastric TTP.  No mass, guarding or rebound  Musc:   No skeletal defects or deformities  Neurologic:   Cranial nerves grossly intact, moves all 4 extremities  Psych:    Good  insight. Not anxious nor agitated  Derm:   No rash, jaundice, nor palpable dermatologic mass  Lab Data Reviewed:   Recent Labs     05/08/23  2032 05/09/23  0503   WBC 11.1* 9.3   HGB 11.7 10.4*   HCT 33.9* 30.9*   PLT 432* 385     Recent Labs     05/08/23  2032 05/09/23  0503 05/10/23  0458   NA 138 139 139   K 4.2 4.0 3.8   CL 110* 112* 111*   CO2 20* 22 21   BUN 28* 26* 26*   MG 2.0 1.9 1.9   PHOS  --  3.8 3.6   ALT 29  --   --      No results found for: "GLUCPOC"  No results for input(s): "PH", "PCO2", "PO2", "HCO3", "FIO2" in the last 72 hours.  No results for input(s): "INR" in the last 72 hours.        Assessment:   (See above)  Principal Problem:    Intractable nausea and vomiting  Resolved Problems:    * No resolved hospital problems. *      Plan:   (See above)      Signed By: Melinda Harder, PA-C     05/10/2023  10:51 AM

## 2023-05-10 NOTE — Plan of Care (Signed)
Problem: Discharge Planning  Goal: Discharge to home or other facility with appropriate resources  Outcome: Progressing     Problem: Pain  Goal: Verbalizes/displays adequate comfort level or baseline comfort level  Outcome: Progressing

## 2023-05-10 NOTE — Consults (Signed)
Session ID: [id number]  Language: Spanish  Interpreter ID: [phone]  Interpreter Name: Theotis Barrio

## 2023-05-10 NOTE — Progress Notes (Signed)
Full consult to follow in am. She has had one normal Cr in the last couple months but most Cr in the 1-1.5 range. 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    Overall, Cr fairly stable now around 1.4. Will check quant urinary protein and serologies and see on rounds tomorrow.

## 2023-05-10 NOTE — Consults (Signed)
Session ID: [id number]  Language: Spanish  Interpreter ID: [id number]  Interpreter Name: Wilmon Arms

## 2023-05-10 NOTE — Progress Notes (Signed)
Spiritual Care Assessment/Progress Note  [facility]    Name: Melinda [name] MRN: [id number]    Age: 42 y.o.     Sex: female   Language: Spanish     Date: 05/10/2023            Total Time Calculated: 15 min              Spiritual Assessment begun in [facility] A3 MULTI-SPECIALTY TELEMETRY  Service Provided For: Patient  Referral/Consult From: Family  Encounter Overview/Reason: Attempted Encounter    Spiritual beliefs:      [x]  Involved in a faith tradition/spiritual practice: Catholic     []  Supported by a faith community:      []  Claims no spiritual orientation:      []  Seeking spiritual identity:           []  Adheres to an individual form of spirituality:      []  Not able to assess:                Identified resources for coping and support system:   Support System: Children, Spouse       [x]  Prayer                  []  Devotional reading               []  Music                  []  Guided Imagery     []  Pet visits                                        []  Other: (COMMENT)     Specific area/focus of visit   Encounter:    Crisis:    Spiritual/Emotional needs: Type: Spiritual Support  Ritual, Rites and Sacraments:    Grief, Loss, and Adjustments:    Ethics/Mediation:    Behavioral Health:    Palliative Care:    Advance Care Planning:      Plan/Referrals: Other (Comment) (Please contact Spiritual Health for further consults.)    Narrative:  Chaplain visit for the patient on Tele 3. Reviewed pt's chart and spoke with pt's nurse. Pt is Spanish speaking. Upon arrival, pt appeared to be resting comfortably. No family present at this time. Chart notes indicate pt copes through the support of her family and her Catholic faith in God. Offered silent prayer of Spiritual Communion on behalf of the patient. Please contact Spiritual Health for further referrals.    Sanjuan Dame, MS, MDiv, [facility]  Spiritual Health Services  Paging service: [phone] (PRAY)

## 2023-05-10 NOTE — Progress Notes (Signed)
[facility]  [address] [place], [place] [address]  [phone]        Hospitalist Progress Note      NAME: Melinda [name]   DOB:  [birth year]  MRM:  [id number]    Date/Time of service: 05/10/2023  9:36 AM       Subjective:     Continued, unchanged stomach pain and nausea. Liquid stools. Feels generally ill and tired.       Objective:       Vitals:       Last 24hrs VS reviewed since prior progress note. Most recent are:    Vitals:    05/10/23 0829   BP: (!) 158/90   Pulse: 96   Resp: 16   Temp: 98.4 F (36.9 C)   SpO2: 98%     SpO2 Readings from Last 6 Encounters:   05/10/23 98%   03/23/23 97%   02/17/23 95%   02/14/23 98%   02/04/23 97%   01/20/23 98%          Intake/Output Summary (Last 24 hours) at 05/10/2023 2130  Last data filed at 05/09/2023 1107  Gross per 24 hour   Intake 50 ml   Output --   Net 50 ml        Exam:     Physical Exam:    General : laying in bed, lethargic  HEENT: moist mucus membranes  Chest: CTAB, normal WOB  CVS: S1 and S2 heard, no m/g/r  Abd: TTP in epigastric area  Ext: WWP, no edema  Neuro/Psych: no focal deficits  Skin: no rash    Medications Reviewed: (see below)    Lab Data Reviewed: (see below)    ______________________________________________________________________    Medications:     Current Facility-Administered Medications   Medication Dose Route Frequency    labetalol (NORMODYNE;TRANDATE) injection 20 mg  20 mg IntraVENous Q6H PRN    famotidine (PEPCID) 20 mg in sodium chloride (PF) 0.9 % 10 mL injection  20 mg IntraVENous Daily    prochlorperazine (COMPAZINE) injection 10 mg  10 mg IntraVENous Q6H    [START ON 05/11/2023] prochlorperazine (COMPAZINE) injection 10 mg  10 mg IntraVENous Q6H PRN    LORazepam (ATIVAN) injection 1 mg  1 mg IntraVENous Q6H PRN    bisacodyl (DULCOLAX) suppository 10 mg  10 mg Rectal Daily    insulin glargine (LANTUS) injection vial 24 Units  24 Units SubCUTAneous Daily    fluconazole (DIFLUCAN) in 0.9 % sodium  chloride IVPB 200 mg  200 mg IntraVENous QAM AC    metoprolol succinate (TOPROL XL) extended release tablet 50 mg  50 mg Oral Daily    polyethylene glycol (GLYCOLAX) packet 17 g  17 g Oral BID    sennosides-docusate sodium (SENOKOT-S) 8.6-50 MG tablet 1 tablet  1 tablet Oral BID    amLODIPine (NORVASC) tablet 10 mg  10 mg Oral Daily    lisinopril (PRINIVIL;ZESTRIL) tablet 40 mg  40 mg Oral Daily    lactated ringers IV soln infusion   IntraVENous Continuous    insulin lispro (HUMALOG,ADMELOG) injection vial 0-8 Units  0-8 Units SubCUTAneous TID WC    insulin lispro (HUMALOG,ADMELOG) injection vial 0-4 Units  0-4 Units SubCUTAneous Nightly    glucose chewable tablet 16 g  4 tablet Oral PRN    dextrose bolus 10% 125 mL  125 mL IntraVENous PRN    Or    dextrose bolus 10% 250 mL  250  mL IntraVENous PRN    glucagon injection 1 mg  1 mg SubCUTAneous PRN    dextrose 10 % infusion   IntraVENous Continuous PRN    cefTRIAXone (ROCEPHIN) 1,000 mg in sterile water 10 mL IV syringe  1,000 mg IntraVENous Q24H    metoclopramide (REGLAN) injection 5 mg  5 mg IntraVENous Q8H RT    HYDROmorphone HCl PF (DILAUDID) injection 1 mg  1 mg IntraVENous Q6H PRN    sodium chloride flush 0.9 % injection 5-40 mL  5-40 mL IntraVENous 2 times per day    sodium chloride flush 0.9 % injection 5-40 mL  5-40 mL IntraVENous PRN    0.9 % sodium chloride infusion   IntraVENous PRN    enoxaparin (LOVENOX) injection 40 mg  40 mg SubCUTAneous Daily    ondansetron (ZOFRAN-ODT) disintegrating tablet 4 mg  4 mg Oral Q8H PRN    Or    ondansetron (ZOFRAN) injection 4 mg  4 mg IntraVENous Q6H PRN    polyethylene glycol (GLYCOLAX) packet 17 g  17 g Oral Daily PRN    acetaminophen (TYLENOL) tablet 650 mg  650 mg Oral Q6H PRN    Or    acetaminophen (TYLENOL) suppository 650 mg  650 mg Rectal Q6H PRN          Lab Review:     Recent Labs     05/08/23  2032 05/09/23  0503   WBC 11.1* 9.3   HGB 11.7 10.4*   HCT 33.9* 30.9*   PLT 432* 385       Recent Labs      05/08/23  2032 05/09/23  0503 05/10/23  0458   NA 138 139 139   K 4.2 4.0 3.8   CL 110* 112* 111*   CO2 20* 22 21   BUN 28* 26* 26*   MG 2.0 1.9 1.9   PHOS  --  3.8 3.6   ALT 29  --   --        No results found for: "GLUCPOC"       Assessment / Plan:     42 yo hx of HTN, DM, CKD 3, cholecystectomy, presented w/ intractable abd pain, N/V, suspected gastroparesis     1) Acute on chronic abd pain/N/V: likely due to gastroparesis, constipation, possibly UTI and H. pylori as well.  Prior abd CT, EGD/colon, gastric emptying studies unrevealing.  IV reglan, IV antiemetics, IV dilaudid prn severe pain.  Consult GI.  Bowel regimen.  Stop PPI at discharge and H. pylori eradication testing in 2 weeks.  Switched dilaudid 1 mg prn to oxycodone 5 prn.    2) Constipation: cont bowel regimen.  Enema prn    3) UTI: Urine culture with only 6000 CFU's of GBS.  Unlikely to be true because of UTI.  Stop ceftriaxone.  Can continue fluconazole due to yeast in urine    4) HTN: cont norvasc, holding lisinopril due to renal insuff.  Use IV labetalol prn    5) DM type 2: A1C 7.4.  Cont lantus, SSI.  Consult DM educator    6) CKD 3 vs persistent/worsening AKI: monitor renal function. nephro    **Prior records, notes, labs, radiology, and medications reviewed in [facility]**    Total time spent with patient care: 33 Minutes **I personally saw and examined the patient during this time period**                 Care Plan discussed with: Patient, nursing,  daughter     Discussed:  Care Plan    Prophylaxis:  Lovenox    Disposition:  Home w/Family           ___________________________________________________    Attending Physician: Ellard Artis, MD

## 2023-05-10 NOTE — Consults (Signed)
Session ID: [id number]  Language: Spanish  Interpreter ID: [phone]  Interpreter Name: Rolm Gala

## 2023-05-10 NOTE — Progress Notes (Signed)
10:55 AM    Ok to DC tele per IDR Dr Lorelle Formosa. Orders place. Nurse and MT notified.

## 2023-05-11 LAB — POCT GLUCOSE
POC Glucose: 113 mg/dL (ref 65–117)
POC Glucose: 110 mg/dL (ref 65–117)
POC Glucose: 99 mg/dL (ref 65–117)
POC Glucose: 131 mg/dL — ABNORMAL HIGH (ref 65–117)

## 2023-05-11 LAB — CBC WITH AUTO DIFFERENTIAL
Basophils %: 1 % (ref 0–1)
Basophils Absolute: 0.1 10*3/uL (ref 0.0–0.1)
Eosinophils %: 1 % (ref 0–7)
Eosinophils Absolute: 0.1 10*3/uL (ref 0.0–0.4)
Hematocrit: 29.8 % — ABNORMAL LOW (ref 35.0–47.0)
Hemoglobin: 10.1 g/dL — ABNORMAL LOW (ref 11.5–16.0)
Immature Granulocytes %: 0 % (ref 0.0–0.5)
Immature Granulocytes Absolute: 0 10*3/uL (ref 0.00–0.04)
Lymphocytes %: 22 % (ref 12–49)
Lymphocytes Absolute: 1.9 10*3/uL (ref 0.8–3.5)
MCH: 29.1 PG (ref 26.0–34.0)
MCHC: 33.9 g/dL (ref 30.0–36.5)
MCV: 85.9 FL (ref 80.0–99.0)
MPV: 9.4 FL (ref 8.9–12.9)
Monocytes %: 7 % (ref 5–13)
Monocytes Absolute: 0.6 10*3/uL (ref 0.0–1.0)
Neutrophils %: 69 % (ref 32–75)
Neutrophils Absolute: 5.8 10*3/uL (ref 1.8–8.0)
Nucleated RBCs: 0 PER 100 WBC
Platelets: 421 10*3/uL — ABNORMAL HIGH (ref 150–400)
RBC: 3.47 M/uL — ABNORMAL LOW (ref 3.80–5.20)
RDW: 13.2 % (ref 11.5–14.5)
WBC: 8.5 10*3/uL (ref 3.6–11.0)
nRBC: 0 10*3/uL (ref 0.00–0.01)

## 2023-05-11 LAB — EXTRA TUBES HOLD

## 2023-05-11 LAB — COMPREHENSIVE METABOLIC PANEL
ALT: 24 U/L (ref 12–78)
AST: 28 U/L (ref 15–37)
Albumin/Globulin Ratio: 0.9 — ABNORMAL LOW (ref 1.1–2.2)
Albumin: 2.6 g/dL — ABNORMAL LOW (ref 3.5–5.0)
Alk Phosphatase: 90 U/L (ref 45–117)
Anion Gap: 7 mmol/L (ref 5–15)
BUN/Creatinine Ratio: 16 (ref 12–20)
BUN: 21 MG/DL — ABNORMAL HIGH (ref 6–20)
CO2: 22 mmol/L (ref 21–32)
Calcium: 8.5 MG/DL (ref 8.5–10.1)
Chloride: 110 mmol/L — ABNORMAL HIGH (ref 97–108)
Creatinine: 1.31 MG/DL — ABNORMAL HIGH (ref 0.55–1.02)
Est, Glom Filt Rate: 52 mL/min/{1.73_m2} — ABNORMAL LOW (ref >60)
Globulin: 3 g/dL (ref 2.0–4.0)
Glucose: 124 mg/dL — ABNORMAL HIGH (ref 65–100)
Potassium: 3.9 mmol/L (ref 3.5–5.1)
Sodium: 139 mmol/L (ref 136–145)
Total Bilirubin: 0.3 MG/DL (ref 0.2–1.0)
Total Protein: 5.6 g/dL — ABNORMAL LOW (ref 6.4–8.2)

## 2023-05-11 LAB — MAGNESIUM: Magnesium: 1.8 mg/dL (ref 1.6–2.4)

## 2023-05-11 MED ORDER — SUCRALFATE 1 G PO TABS
1 | Freq: Four times a day (QID) | ORAL | Status: AC
Start: 2023-05-11 — End: 2023-05-12
  Administered 2023-05-11 – 2023-05-13 (×6): 1 g via ORAL

## 2023-05-11 MED ORDER — LACTATED RINGERS IV BOLUS
Freq: Once | INTRAVENOUS | Status: AC
Start: 2023-05-11 — End: 2023-05-11
  Administered 2023-05-11: 18:00:00 1000 mL via INTRAVENOUS

## 2023-05-11 MED ORDER — ACETAMINOPHEN 500 MG PO TABS
500 | Freq: Three times a day (TID) | ORAL | Status: DC
Start: 2023-05-11 — End: 2023-05-14
  Administered 2023-05-11 – 2023-05-14 (×7): 1000 mg via ORAL

## 2023-05-11 MED ORDER — POLYETHYLENE GLYCOL 3350 17 G PO PACK
17 | Freq: Every day | ORAL | Status: DC
Start: 2023-05-11 — End: 2023-05-14
  Administered 2023-05-14: 12:00:00 17 g via ORAL

## 2023-05-11 MED ORDER — OXYCODONE HCL 5 MG PO TABS
5 | ORAL | Status: DC | PRN
Start: 2023-05-11 — End: 2023-05-14

## 2023-05-11 MED ORDER — HYDROMORPHONE HCL PF 1 MG/ML IJ SOLN
1 | Freq: Once | INTRAMUSCULAR | Status: AC
Start: 2023-05-11 — End: 2023-05-11
  Administered 2023-05-11: 08:00:00 1 mg via INTRAVENOUS

## 2023-05-11 MED ORDER — HYDROMORPHONE HCL PF 1 MG/ML IJ SOLN
1 | INTRAMUSCULAR | Status: DC | PRN
Start: 2023-05-11 — End: 2023-05-14
  Administered 2023-05-12 – 2023-05-14 (×6): 1 mg via INTRAVENOUS

## 2023-05-11 MED ORDER — HYDROMORPHONE 0.5MG/0.5ML IJ SOLN
1 | Status: DC | PRN
Start: 2023-05-11 — End: 2023-05-14
  Administered 2023-05-11: 18:00:00 0.5 mg via INTRAVENOUS

## 2023-05-11 MED ORDER — INSULIN GLARGINE 100 UNIT/ML SC SOLN
100 | Freq: Every day | SUBCUTANEOUS | Status: DC
Start: 2023-05-11 — End: 2023-05-14
  Administered 2023-05-11: 13:00:00 12 [IU] via SUBCUTANEOUS

## 2023-05-11 MED ORDER — OXYCODONE HCL 5 MG PO TABS
5 | ORAL | Status: DC | PRN
Start: 2023-05-11 — End: 2023-05-14
  Administered 2023-05-12 – 2023-05-14 (×5): 10 mg via ORAL

## 2023-05-11 MED FILL — METOPROLOL SUCCINATE ER 50 MG PO TB24: 50 MG | ORAL | Qty: 1

## 2023-05-11 MED FILL — ACETAMINOPHEN EXTRA STRENGTH 500 MG PO TABS: 500 MG | ORAL | Qty: 2

## 2023-05-11 MED FILL — POLYETHYLENE GLYCOL 3350 17 G PO PACK: 17 g | ORAL | Qty: 1

## 2023-05-11 MED FILL — LACTATED RINGERS IV SOLN: INTRAVENOUS | Qty: 1000

## 2023-05-11 MED FILL — AMLODIPINE BESYLATE 5 MG PO TABS: 5 MG | ORAL | Qty: 2

## 2023-05-11 MED FILL — METOCLOPRAMIDE HCL 5 MG/ML IJ SOLN: 5 MG/ML | INTRAMUSCULAR | Qty: 2

## 2023-05-11 MED FILL — DULCOLAX 10 MG RE SUPP: 10 MG | RECTAL | Qty: 1

## 2023-05-11 MED FILL — OXYCODONE HCL 5 MG PO TABS: 5 MG | ORAL | Qty: 1

## 2023-05-11 MED FILL — PROCHLORPERAZINE EDISYLATE 10 MG/2ML IJ SOLN: 10 MG/2ML | INTRAMUSCULAR | Qty: 2

## 2023-05-11 MED FILL — LISINOPRIL 20 MG PO TABS: 20 MG | ORAL | Qty: 2

## 2023-05-11 MED FILL — FLUCONAZOLE IN SODIUM CHLORIDE 200-0.9 MG/100ML-% IV SOLN: INTRAVENOUS | Qty: 100

## 2023-05-11 MED FILL — ENOXAPARIN SODIUM 40 MG/0.4ML IJ SOSY: 40 MG/0.4ML | INTRAMUSCULAR | Qty: 0.4

## 2023-05-11 MED FILL — STOOL SOFTENER/LAXATIVE 50-8.6 MG PO TABS: ORAL | Qty: 1

## 2023-05-11 MED FILL — FAMOTIDINE (PF) 20 MG/2ML IV SOLN: 20 MG/2ML | INTRAVENOUS | Qty: 2

## 2023-05-11 MED FILL — HYDROMORPHONE HCL 1 MG/ML IJ SOLN: 1 MG/ML | INTRAMUSCULAR | Qty: 0.5

## 2023-05-11 MED FILL — HYDROMORPHONE HCL 1 MG/ML IJ SOLN: 1 MG/ML | INTRAMUSCULAR | Qty: 1

## 2023-05-11 MED FILL — SUCRALFATE 1 G PO TABS: 1 GM | ORAL | Qty: 1

## 2023-05-11 MED FILL — LANTUS 100 UNIT/ML SC SOLN: 100 UNIT/ML | SUBCUTANEOUS | Qty: 24

## 2023-05-11 NOTE — Plan of Care (Signed)
Problem: Discharge Planning  Goal: Discharge to home or other facility with appropriate resources  Outcome: Progressing     Problem: Pain  Goal: Verbalizes/displays adequate comfort level or baseline comfort level  Outcome: Progressing     Problem: Safety - Adult  Goal: Free from fall injury  Outcome: Progressing

## 2023-05-11 NOTE — Progress Notes (Addendum)
[facility] ([facility])  [address] [place]ette [place], [place] [address]  [phone]        Hospitalist Progress Note      NAME: Melinda [name]   DOB:  [birth year]  MRM:  [id number]    Date/Time of service: 05/11/2023  10:46 AM       Subjective:     Stomach pain is much worse today. Still with nausea. Not able to keep anything down. No UTI symptoms reported.       Objective:       Vitals:       Last 24hrs VS reviewed since prior progress note. Most recent are:    Vitals:    05/11/23 0924   BP: (!) 161/91   Pulse: 90   Resp: 18   Temp:    SpO2:      SpO2 Readings from Last 6 Encounters:   05/11/23 97%   03/23/23 97%   02/17/23 95%   02/14/23 98%   02/04/23 97%   01/20/23 98%        No intake or output data in the 24 hours ending 05/11/23 1046       Exam:     Physical Exam:    General : laying in bed, lethargic  HEENT: moist mucus membranes  Chest: CTAB, normal WOB  CVS: S1 and S2 heard, no m/g/r  Abd: TTP in epigastric area  Ext: WWP, no edema  Neuro/Psych: no focal deficits  Skin: no rash    Medications Reviewed: (see below)    Lab Data Reviewed: (see below)    ______________________________________________________________________    Medications:     Current Facility-Administered Medications   Medication Dose Route Frequency    insulin glargine (LANTUS) injection vial 12 Units  12 Units SubCUTAneous Daily    sucralfate (CARAFATE) tablet 1 g  1 g Oral 4x Daily AC & HS    labetalol (NORMODYNE) tablet 100 mg  100 mg Oral Q8H PRN    oxyCODONE (ROXICODONE) immediate release tablet 5 mg  5 mg Oral Q4H PRN    famotidine (PEPCID) 20 mg in sodium chloride (PF) 0.9 % 10 mL injection  20 mg IntraVENous Daily    prochlorperazine (COMPAZINE) injection 10 mg  10 mg IntraVENous Q6H PRN    LORazepam (ATIVAN) injection 1 mg  1 mg IntraVENous Q6H PRN    bisacodyl (DULCOLAX) suppository 10 mg  10 mg Rectal Daily    fluconazole (DIFLUCAN) in 0.9 % sodium chloride IVPB 200 mg  200 mg IntraVENous QAM AC    metoprolol  succinate (TOPROL XL) extended release tablet 50 mg  50 mg Oral Daily    polyethylene glycol (GLYCOLAX) packet 17 g  17 g Oral BID    sennosides-docusate sodium (SENOKOT-S) 8.6-50 MG tablet 1 tablet  1 tablet Oral BID    amLODIPine (NORVASC) tablet 10 mg  10 mg Oral Daily    lisinopril (PRINIVIL;ZESTRIL) tablet 40 mg  40 mg Oral Daily    lactated ringers IV soln infusion   IntraVENous Continuous    insulin lispro (HUMALOG,ADMELOG) injection vial 0-8 Units  0-8 Units SubCUTAneous TID WC    insulin lispro (HUMALOG,ADMELOG) injection vial 0-4 Units  0-4 Units SubCUTAneous Nightly    glucose chewable tablet 16 g  4 tablet Oral PRN    dextrose bolus 10% 125 mL  125 mL IntraVENous PRN    Or    dextrose bolus 10% 250 mL  250 mL IntraVENous PRN  glucagon injection 1 mg  1 mg SubCUTAneous PRN    dextrose 10 % infusion   IntraVENous Continuous PRN    metoclopramide (REGLAN) injection 5 mg  5 mg IntraVENous Q8H RT    sodium chloride flush 0.9 % injection 5-40 mL  5-40 mL IntraVENous 2 times per day    sodium chloride flush 0.9 % injection 5-40 mL  5-40 mL IntraVENous PRN    0.9 % sodium chloride infusion   IntraVENous PRN    enoxaparin (LOVENOX) injection 40 mg  40 mg SubCUTAneous Daily    ondansetron (ZOFRAN-ODT) disintegrating tablet 4 mg  4 mg Oral Q8H PRN    Or    ondansetron (ZOFRAN) injection 4 mg  4 mg IntraVENous Q6H PRN    polyethylene glycol (GLYCOLAX) packet 17 g  17 g Oral Daily PRN    acetaminophen (TYLENOL) tablet 650 mg  650 mg Oral Q6H PRN    Or    acetaminophen (TYLENOL) suppository 650 mg  650 mg Rectal Q6H PRN          Lab Review:     Recent Labs     05/08/23  2032 05/09/23  0503 05/11/23  0200   WBC 11.1* 9.3 8.5   HGB 11.7 10.4* 10.1*   HCT 33.9* 30.9* 29.8*   PLT 432* 385 421*       Recent Labs     05/08/23  2032 05/09/23  0503 05/10/23  0458 05/11/23  0200   NA 138 139 139 139   K 4.2 4.0 3.8 3.9   CL 110* 112* 111* 110*   CO2 20* 22 21 22    BUN 28* 26* 26* 21*   MG 2.0 1.9 1.9 1.8   PHOS  --  3.8 3.6   --    ALT 29  --   --  24       No results found for: "GLUCPOC"       Assessment / Plan:     42 yo hx of HTN, DM, CKD 3, cholecystectomy, presented w/ intractable abd pain, N/V, suspected gastroparesis     1) Acute on chronic abd pain/N/V: Prior abd CT, EGD/colon, gastric emptying studies unrevealing. Current CT with only some fecal stasis. IV reglan, IV antiemetics, IV dilaudid prn severe pain.  Consult GI.  Bowel regimen.  Stop PPI at discharge and H. pylori eradication testing in 2 weeks.  -Switched dilaudid 1 mg prn to oxycodone 5 prn 7/30    -due to worsening pain 7/31 restarted dilaudid and ordered increased oxy  -stop senna and dec miralax to daily given abdominal pain  -pt may need further workup given severity and persistence of abdominal pain. Discussing with GI  -carafate and PPI per GI    2) Constipation: cont bowel regimen.  Enema prn    3) UTI: Urine culture with only 6000 CFU's of GBS.  Unlikely to be true because of UTI.  Stop ceftriaxone.  Can continue fluconazole due to yeast in urine    4) HTN: cont norvasc, holding lisinopril due to renal insuff.  Use PO labetalol prn, treat pain    5) DM type 2: A1C 7.4.  Cont lantus, SSI.  Consult DM educator    6) CKD 3 vs persistent/worsening AKI: monitor renal function. Nephro  -bolus and increase IVF due to emesis    **Prior records, notes, labs, radiology, and medications reviewed in [facility]**    Total time spent with patient care: 45 Minutes **I personally saw  and examined the patient during this time period**                 Care Plan discussed with: Patient, nursing, daughter     Discussed:  Care Plan    Prophylaxis:  Lovenox    Disposition:  Home w/Family           ___________________________________________________    Attending Physician: Ellard Artis, MD

## 2023-05-11 NOTE — Consults (Signed)
Session ID: [id number]  Language: Spanish  Interpreter ID: [id number]  Interpreter Name: Kelby Fam

## 2023-05-11 NOTE — Consults (Signed)
Session ID: [id number]  Language: Spanish  Interpreter ID: #[id number]  Interpreter Name: Jorene Guest

## 2023-05-11 NOTE — Progress Notes (Signed)
Melinda Cannella, PA-C                       [phone] office             Monday-Friday 8:00 am-4:30 pm  I am not permitted to use "perfect serve" use above for contact, thanks.        Gastroenterology Progress Note    May 11, 2023  Admit Date: 05/08/2023         Narrative Assessment and Plan   42 yo F with history of HTN, hogh cholesterol, and type 2 diabetes on insulin, admitted with abd pain, N/V. CT with moderate fecal stasis, otherwise no acute abnormality. S/p cholecystectomy. UA with 1+ bacteria and yeast. H pylori diagnosed on EGD pathology 01/18/23, s/p bismuth, metronidazole, and doxycycline therapy. Labs today with elevated creatinine 1.42, leukocytosis had resolved yesterday, today's CBC in process. Reports a few liquid bowel movements this morning, no solid ones yet.     05/11/23: Reports persistent epigastric pain, nontender abdomen. Audible belching during encounter. Bm now soft, light brown/yellow.      Impression:  Abdominal pain  Nausea/vomiting   Fecal stasis     Plan:  Repeat EGD Friday to rule out any new gastric etiology which as developed since prior EGD in April.   Continue current regimen withMiralax BID, and Sennokot BID for the time being until some solid stool is seen.   Recommend Miralax BID and Senokot BID at discharge. Discussed importance of a consistent bowel regimen.   Trial carafate today to help with epigastric abd pain, continue pantoprazole.   H pylori eradication testing as outpatient at our office.     [name], PA-C    Subjective:   Chief Complaint: abd pain, N/V     History of Present Illness: 42 yo F with history of HTN, high cholesterol, type 2 diabetes, admitted with abdominal pain, nausea, and vomiting. She was last discharged 03/21/23 and was feeling better for a little while, took omeprazole qAM daily and Miralax once every 3 days, but eventually her abd pain, N/V  returned. She reports having a soft BM every day. Reports the pain is in the mid epigastrium. She has not taken Senokot or Reglan at home. The only recent change in her medication is  that she recently decreased her insulin dose due to low blood sugars at home.     ROS:  The previous review of systems on initial consultation / H&P is noted and reviewed.  Specific changes noted above in HPI.    Current Medications:     Current Facility-Administered Medications   Medication Dose Route Frequency    insulin glargine (LANTUS) injection vial 12 Units  12 Units SubCUTAneous Daily    sucralfate (CARAFATE) tablet 1 g  1 g Oral 4x Daily AC & HS    labetalol (NORMODYNE) tablet 100 mg  100 mg Oral Q8H PRN    oxyCODONE (ROXICODONE) immediate release tablet 5 mg  5 mg Oral Q4H PRN    famotidine (PEPCID) 20 mg in sodium chloride (PF) 0.9 % 10 mL injection  20 mg IntraVENous Daily    prochlorperazine (COMPAZINE) injection 10 mg  10 mg IntraVENous Q6H PRN    LORazepam (ATIVAN) injection 1 mg  1 mg IntraVENous Q6H PRN    bisacodyl (DULCOLAX) suppository 10 mg  10 mg Rectal Daily    fluconazole (DIFLUCAN) in 0.9 % sodium chloride IVPB 200 mg  200 mg IntraVENous QAM  AC    metoprolol succinate (TOPROL XL) extended release tablet 50 mg  50 mg Oral Daily    polyethylene glycol (GLYCOLAX) packet 17 g  17 g Oral BID    sennosides-docusate sodium (SENOKOT-S) 8.6-50 MG tablet 1 tablet  1 tablet Oral BID    amLODIPine (NORVASC) tablet 10 mg  10 mg Oral Daily    lisinopril (PRINIVIL;ZESTRIL) tablet 40 mg  40 mg Oral Daily    lactated ringers IV soln infusion   IntraVENous Continuous    insulin lispro (HUMALOG,ADMELOG) injection vial 0-8 Units  0-8 Units SubCUTAneous TID WC    insulin lispro (HUMALOG,ADMELOG) injection vial 0-4 Units  0-4 Units SubCUTAneous Nightly    glucose chewable tablet 16 g  4 tablet Oral PRN    dextrose bolus 10% 125 mL  125 mL IntraVENous PRN    Or    dextrose bolus 10% 250 mL  250 mL IntraVENous PRN    glucagon  injection 1 mg  1 mg SubCUTAneous PRN    dextrose 10 % infusion   IntraVENous Continuous PRN    metoclopramide (REGLAN) injection 5 mg  5 mg IntraVENous Q8H RT    sodium chloride flush 0.9 % injection 5-40 mL  5-40 mL IntraVENous 2 times per day    sodium chloride flush 0.9 % injection 5-40 mL  5-40 mL IntraVENous PRN    0.9 % sodium chloride infusion   IntraVENous PRN    enoxaparin (LOVENOX) injection 40 mg  40 mg SubCUTAneous Daily    ondansetron (ZOFRAN-ODT) disintegrating tablet 4 mg  4 mg Oral Q8H PRN    Or    ondansetron (ZOFRAN) injection 4 mg  4 mg IntraVENous Q6H PRN    polyethylene glycol (GLYCOLAX) packet 17 g  17 g Oral Daily PRN    acetaminophen (TYLENOL) tablet 650 mg  650 mg Oral Q6H PRN    Or    acetaminophen (TYLENOL) suppository 650 mg  650 mg Rectal Q6H PRN       Objective:     VITALS:   Last 24hrs VS reviewed since prior progress note. Most recent are:  Vitals:    05/11/23 0924   BP: (!) 161/91   Pulse: 90   Resp: 18   Temp:    SpO2:      Temp (24hrs), Avg:98.3 F (36.8 C), Min:98.1 F (36.7 C), Max:98.8 F (37.1 C)      Intake/Output Summary (Last 24 hours) at 05/11/2023 0959  Last data filed at 05/10/2023 1032  Gross per 24 hour   Intake 32.06 ml   Output --   Net 32.06 ml       EXAM:  General:  Comfortable, no distress. Comfortably sleeping as I enter the room.   HEENT:  Atraumatic skull, pupils equal  Lungs:   No cough or increased work of breathing. Speaking in complete sentences  Abdomen:  Non-distended, non-tender.  No mass, guarding or rebound. Bowel sounds active. Audibly belching.   Musc:   No skeletal defects or deformities  Neurologic:   Cranial nerves grossly intact, moves all 4 extremities  Psych:    Good insight. Not anxious nor agitated  Derm:   No rash, jaundice, nor palpable dermatologic mass    Lab Data Reviewed:   Recent Labs     05/08/23  2032 05/09/23  0503 05/11/23  0200   WBC 11.1* 9.3 8.5   HGB 11.7 10.4* 10.1*   HCT 33.9* 30.9* 29.8*   PLT 432* 385 421*  Recent Labs      05/08/23  2032 05/09/23  0503 05/10/23  0458 05/11/23  0200   NA 138 139 139 139   K 4.2 4.0 3.8 3.9   CL 110* 112* 111* 110*   CO2 20* 22 21 22    BUN 28* 26* 26* 21*   MG 2.0 1.9 1.9 1.8   PHOS  --  3.8 3.6  --    ALT 29  --   --  24     No results found for: "GLUCPOC"  No results for input(s): "PH", "PCO2", "PO2", "HCO3", "FIO2" in the last 72 hours.  No results for input(s): "INR" in the last 72 hours.        Assessment:   (See above)  Principal Problem:    Intractable nausea and vomiting  Resolved Problems:    * No resolved hospital problems. *      Plan:   (See above)      Signed By: [name], PA-C     05/11/2023  9:59 AM

## 2023-05-11 NOTE — Consults (Signed)
Session ID: [id number]  Language: Spanish  Interpreter ID: [id number]  Interpreter Name: Chase Caller

## 2023-05-11 NOTE — Consults (Signed)
Melinda Melinda. 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 Creat is 1.3 which seems to be around baseline. She has proteinuria with P/C ratio over 5. She is a/w N/V and abd pain.   ROS: +n/v, +swelling, no hematuria  PMH: DM2, HTN, HL  SH: nonsmoker  Current Facility-Administered Medications   Medication Dose Route Frequency    insulin glargine (LANTUS) injection vial 12 Units  12 Units SubCUTAneous Daily    sucralfate (CARAFATE) tablet 1 g  1 g Oral 4x Daily AC & HS    acetaminophen (TYLENOL) tablet 1,000 mg  1,000 mg Oral 3 times per day    oxyCODONE (ROXICODONE) immediate release tablet 5 mg  5 mg Oral Q4H PRN    Or    oxyCODONE (ROXICODONE) immediate release tablet 10 mg  10 mg Oral Q4H PRN    HYDROmorphone (DILAUDID) injection 0.5 mg  0.5 mg IntraVENous Q3H PRN    Or    HYDROmorphone HCl PF (DILAUDID) injection 1 mg  1 mg IntraVENous Q3H PRN    lactated ringers bolus 1,000 mL  1,000 mL IntraVENous Once    polyethylene glycol (GLYCOLAX) packet 17 g  17 g Oral Daily    labetalol (NORMODYNE) tablet 100 mg  100 mg Oral Q8H PRN    famotidine (PEPCID) 20 mg in sodium chloride (PF) 0.9 % 10 mL injection  20 mg IntraVENous Daily    prochlorperazine (COMPAZINE) injection 10 mg  10 mg IntraVENous Q6H PRN    LORazepam (ATIVAN) injection 1 mg  1 mg IntraVENous Q6H PRN    bisacodyl (DULCOLAX) suppository 10 mg  10 mg Rectal Daily    fluconazole (DIFLUCAN) in 0.9 % sodium chloride IVPB 200 mg  200 mg IntraVENous QAM AC    metoprolol succinate (TOPROL XL) extended release tablet 50 mg  50 mg Oral Daily    [Held by provider] sennosides-docusate  sodium (SENOKOT-S) 8.6-50 MG tablet 1 tablet  1 tablet Oral BID    amLODIPine (NORVASC) tablet 10 mg  10 mg Oral Daily    lactated ringers IV soln infusion   IntraVENous Continuous    insulin lispro (HUMALOG,ADMELOG) injection vial 0-8 Units  0-8 Units SubCUTAneous TID WC    insulin lispro (HUMALOG,ADMELOG) injection vial 0-4 Units  0-4 Units SubCUTAneous Nightly    glucose chewable tablet 16 g  4 tablet Oral PRN    dextrose bolus 10% 125 mL  125 mL IntraVENous PRN    Or    dextrose bolus 10% 250 mL  250 mL IntraVENous PRN    glucagon injection 1 mg  1 mg SubCUTAneous PRN    dextrose 10 % infusion   IntraVENous Continuous PRN    metoclopramide (REGLAN) injection 5 mg  5 mg IntraVENous Q8H RT    sodium chloride flush 0.9 % injection 5-40 mL  5-40 mL IntraVENous 2 times per day    sodium chloride flush 0.9 % injection 5-40 mL  5-40 mL IntraVENous PRN  0.9 % sodium chloride infusion   IntraVENous PRN    enoxaparin (LOVENOX) injection 40 mg  40 mg SubCUTAneous Daily    ondansetron (ZOFRAN-ODT) disintegrating tablet 4 mg  4 mg Oral Q8H PRN    Or    ondansetron (ZOFRAN) injection 4 mg  4 mg IntraVENous Q6H PRN    polyethylene glycol (GLYCOLAX) packet 17 g  17 g Oral Daily PRN          Objective:     Vitals:  Blood pressure (!) 173/95, pulse 90, temperature 98.8 F (37.1 C), temperature source Oral, resp. rate 17, height 1.499 m (4\' 11" ), weight 60 kg (132 lb 4.4 oz), last menstrual period 05/07/2023, SpO2 100 %.  Temp (24hrs), Avg:98.4 F (36.9 C), Min:98.1 F (36.7 C), Max:98.8 F (37.1 C)      Intake and Output:  07/31 0701 - 07/31 1900  In: 511.4 [I.V.:411.4]  Out: -   07/29 1901 - 07/31 0700  In: 2321.8 [I.V.:2221.8]  Out: -     Physical Exam:               GENERAL ASSESSMENT: NAD  HEENT: Nontraumatic   CHEST: CTA  HEART: S1S2  ABDOMEN: Soft,NT  EXTREMITY: no EDEMA  NEURO: Grossly non focal          ECG/rhythm:    Data Review        Recent Labs     05/09/23  0503 05/10/23  0458 05/11/23  0200   NA 139 139 139    K 4.0 3.8 3.9   CL 112* 111* 110*   CO2 22 21 22    BUN 26* 26* 21*   CREATININE 1.24* 1.42* 1.31*   GLUCOSE 195* 169* 124*   CALCIUM 8.2* 8.1* 8.5           : Laureen Ochs, MD  05/11/2023        [facility]:  [email or web address]  [email or web address]    [place] office:  [address] [place], [address]  [place], [place] [address]  Phone: [phone]  Fax :     [phone]    [facility] office:  [address]  [place], [place] [address]  Phone - [phone]  Fax - [phone]

## 2023-05-12 LAB — COMPREHENSIVE METABOLIC PANEL
ALT: 28 U/L (ref 12–78)
AST: 38 U/L — ABNORMAL HIGH (ref 15–37)
Albumin/Globulin Ratio: 0.8 — ABNORMAL LOW (ref 1.1–2.2)
Albumin: 2.3 g/dL — ABNORMAL LOW (ref 3.5–5.0)
Alk Phosphatase: 81 U/L (ref 45–117)
Anion Gap: 5 mmol/L (ref 5–15)
BUN/Creatinine Ratio: 14 (ref 12–20)
BUN: 16 MG/DL (ref 6–20)
CO2: 25 mmol/L (ref 21–32)
Calcium: 8.1 MG/DL — ABNORMAL LOW (ref 8.5–10.1)
Chloride: 108 mmol/L (ref 97–108)
Creatinine: 1.14 MG/DL — ABNORMAL HIGH (ref 0.55–1.02)
Est, Glom Filt Rate: 62 mL/min/{1.73_m2} (ref >60)
Globulin: 2.9 g/dL (ref 2.0–4.0)
Glucose: 74 mg/dL (ref 65–100)
Potassium: 3.9 mmol/L (ref 3.5–5.1)
Sodium: 138 mmol/L (ref 136–145)
Total Bilirubin: 0.4 MG/DL (ref 0.2–1.0)
Total Protein: 5.2 g/dL — ABNORMAL LOW (ref 6.4–8.2)

## 2023-05-12 LAB — CBC WITH AUTO DIFFERENTIAL
Basophils %: 1 % (ref 0–1)
Basophils Absolute: 0.1 10*3/uL (ref 0.0–0.1)
Eosinophils %: 1 % (ref 0–7)
Eosinophils Absolute: 0.1 10*3/uL (ref 0.0–0.4)
Hematocrit: 27 % — ABNORMAL LOW (ref 35.0–47.0)
Hemoglobin: 9.4 g/dL — ABNORMAL LOW (ref 11.5–16.0)
Immature Granulocytes %: 0 % (ref 0.0–0.5)
Immature Granulocytes Absolute: 0 10*3/uL (ref 0.00–0.04)
Lymphocytes %: 28 % (ref 12–49)
Lymphocytes Absolute: 2.2 10*3/uL (ref 0.8–3.5)
MCH: 29.2 PG (ref 26.0–34.0)
MCHC: 34.8 g/dL (ref 30.0–36.5)
MCV: 83.9 FL (ref 80.0–99.0)
MPV: 9 FL (ref 8.9–12.9)
Monocytes %: 9 % (ref 5–13)
Monocytes Absolute: 0.7 10*3/uL (ref 0.0–1.0)
Neutrophils %: 61 % (ref 32–75)
Neutrophils Absolute: 4.6 10*3/uL (ref 1.8–8.0)
Nucleated RBCs: 0 PER 100 WBC
Platelets: 353 10*3/uL (ref 150–400)
RBC: 3.22 M/uL — ABNORMAL LOW (ref 3.80–5.20)
RDW: 13.2 % (ref 11.5–14.5)
WBC: 7.6 10*3/uL (ref 3.6–11.0)
nRBC: 0 10*3/uL (ref 0.00–0.01)

## 2023-05-12 LAB — POCT GLUCOSE
POC Glucose: 106 mg/dL (ref 65–117)
POC Glucose: 70 mg/dL (ref 65–117)
POC Glucose: 138 mg/dL — ABNORMAL HIGH (ref 65–117)
POC Glucose: 146 mg/dL — ABNORMAL HIGH (ref 65–117)
POC Glucose: 96 mg/dL (ref 65–117)

## 2023-05-12 LAB — ANA, DIRECT, W/REFLEX: ANA: NEGATIVE

## 2023-05-12 LAB — EKG 12-LEAD
Atrial Rate: 90 {beats}/min
Diagnosis: NORMAL
P Axis: 45 degrees
P-R Interval: 148 ms
Q-T Interval: 380 ms
QRS Duration: 88 ms
QTc Calculation (Bazett): 464 ms
R Axis: -68 degrees
T Axis: 51 degrees
Ventricular Rate: 90 {beats}/min

## 2023-05-12 LAB — MAGNESIUM: Magnesium: 1.7 mg/dL (ref 1.6–2.4)

## 2023-05-12 MED FILL — METOPROLOL SUCCINATE ER 50 MG PO TB24: 50 MG | ORAL | Qty: 1

## 2023-05-12 MED FILL — FAMOTIDINE (PF) 20 MG/2ML IV SOLN: 20 MG/2ML | INTRAVENOUS | Qty: 2

## 2023-05-12 MED FILL — METOCLOPRAMIDE HCL 5 MG/ML IJ SOLN: 5 MG/ML | INTRAMUSCULAR | Qty: 2

## 2023-05-12 MED FILL — ACETAMINOPHEN EXTRA STRENGTH 500 MG PO TABS: 500 MG | ORAL | Qty: 2

## 2023-05-12 MED FILL — ONDANSETRON 4 MG PO TBDP: 4 MG | ORAL | Qty: 1

## 2023-05-12 MED FILL — HYDROMORPHONE HCL 1 MG/ML IJ SOLN: 1 MG/ML | INTRAMUSCULAR | Qty: 1

## 2023-05-12 MED FILL — OXYCODONE HCL 5 MG PO TABS: 5 MG | ORAL | Qty: 2

## 2023-05-12 MED FILL — ONDANSETRON HCL 4 MG/2ML IJ SOLN: 4 MG/2ML | INTRAMUSCULAR | Qty: 2

## 2023-05-12 MED FILL — SUCRALFATE 1 G PO TABS: 1 GM | ORAL | Qty: 1

## 2023-05-12 MED FILL — AMLODIPINE BESYLATE 5 MG PO TABS: 5 MG | ORAL | Qty: 2

## 2023-05-12 MED FILL — FLUCONAZOLE IN SODIUM CHLORIDE 200-0.9 MG/100ML-% IV SOLN: INTRAVENOUS | Qty: 100

## 2023-05-12 MED FILL — ENOXAPARIN SODIUM 40 MG/0.4ML IJ SOSY: 40 MG/0.4ML | INTRAMUSCULAR | Qty: 0.4

## 2023-05-12 NOTE — Consults (Signed)
Session ID: [id number]  Language: Spanish  Interpreter ID: [id number]  Interpreter Name: Tinnie Gens

## 2023-05-12 NOTE — Consults (Signed)
Session ID: [id number]  Language: Spanish  Interpreter ID: [phone]  Interpreter Name: Tawanna Sat

## 2023-05-12 NOTE — Progress Notes (Signed)
[facility]  [address] [place], [place] [address]  [phone]      Hospitalist  Progress Note      NAME:       Melinda [name]   DOB:        [birth year]  MRM:        [id number]    Date of service: 05/12/2023      Subjective: Patient seen and examined by me. Patient admitted with abdominal pain. She says she now feels better. Tolerating current diet. She denies any fever or chills. Denies any dysuria or frequency.  Discussed with her through a video interpretor    Objective:    Vital Signs:    BP (!) 155/93 Comment: notified nurse  Pulse 88   Temp 98.1 F (36.7 C) (Oral)   Resp 18   Ht 1.499 m (4\' 11" )   Wt 60 kg (132 lb 4.4 oz)   LMP 05/07/2023   SpO2 100%   BMI 26.72 kg/m        Intake/Output Summary (Last 24 hours) at 05/12/2023 0727  Last data filed at 05/11/2023 1811  Gross per 24 hour   Intake 1346.76 ml   Output --   Net 1346.76 ml        Current inpatient medications reviewed:  Current Facility-Administered Medications   Medication Dose Route Frequency    insulin glargine (LANTUS) injection vial 12 Units  12 Units SubCUTAneous Daily    sucralfate (CARAFATE) tablet 1 g  1 g Oral 4x Daily AC & HS    acetaminophen (TYLENOL) tablet 1,000 mg  1,000 mg Oral 3 times per day    oxyCODONE (ROXICODONE) immediate release tablet 5 mg  5 mg Oral Q4H PRN    Or    oxyCODONE (ROXICODONE) immediate release tablet 10 mg  10 mg Oral Q4H PRN    HYDROmorphone (DILAUDID) injection 0.5 mg  0.5 mg IntraVENous Q3H PRN    Or    HYDROmorphone HCl PF (DILAUDID) injection 1 mg  1 mg IntraVENous Q3H PRN    polyethylene glycol (GLYCOLAX) packet 17 g  17 g Oral Daily    labetalol (NORMODYNE) tablet 100 mg  100 mg Oral Q8H PRN    famotidine (PEPCID) 20 mg in sodium chloride (PF) 0.9 % 10 mL injection  20 mg IntraVENous Daily    prochlorperazine (COMPAZINE) injection 10 mg  10 mg IntraVENous Q6H PRN    LORazepam (ATIVAN) injection 1 mg   1 mg IntraVENous Q6H PRN    bisacodyl (DULCOLAX) suppository 10 mg  10 mg Rectal Daily    fluconazole (DIFLUCAN) in 0.9 % sodium chloride IVPB 200 mg  200 mg IntraVENous QAM AC    metoprolol succinate (TOPROL XL) extended release tablet 50 mg  50 mg Oral Daily    [Held by provider] sennosides-docusate sodium (SENOKOT-S) 8.6-50 MG tablet 1 tablet  1 tablet Oral BID    amLODIPine (NORVASC) tablet 10 mg  10 mg Oral Daily    lactated ringers IV soln infusion   IntraVENous Continuous    insulin lispro (HUMALOG,ADMELOG) injection vial 0-8 Units  0-8 Units SubCUTAneous TID WC    insulin lispro (HUMALOG,ADMELOG) injection vial 0-4 Units  0-4 Units SubCUTAneous Nightly    glucose chewable tablet 16 g  4 tablet Oral PRN    dextrose bolus 10% 125 mL  125 mL IntraVENous PRN    Or    dextrose bolus 10% 250 mL  250 mL IntraVENous  PRN    glucagon injection 1 mg  1 mg SubCUTAneous PRN    dextrose 10 % infusion   IntraVENous Continuous PRN    metoclopramide (REGLAN) injection 5 mg  5 mg IntraVENous Q8H RT    sodium chloride flush 0.9 % injection 5-40 mL  5-40 mL IntraVENous 2 times per day    sodium chloride flush 0.9 % injection 5-40 mL  5-40 mL IntraVENous PRN    0.9 % sodium chloride infusion   IntraVENous PRN    enoxaparin (LOVENOX) injection 40 mg  40 mg SubCUTAneous Daily    ondansetron (ZOFRAN-ODT) disintegrating tablet 4 mg  4 mg Oral Q8H PRN    Or    ondansetron (ZOFRAN) injection 4 mg  4 mg IntraVENous Q6H PRN    polyethylene glycol (GLYCOLAX) packet 17 g  17 g Oral Daily PRN       Physical Examination:    General:   Weak and ill looking patient in no acute distress  Eyes:   pink conjunctivae, PERRLA with no discharge.  ENT:   no ottorrhea or rhinorrhea with dry mucous membranes  Neck: no masses, thyroid non-tender and trachea central.  Pulm:  no accessory muscle use, decreased but clear breath sounds without crackles or wheezes  Card:  no JVD or murmurs, has regular and normal S1, S2 without thrills, bruits or peripheral  edema  Abd:  Soft, non-tender, non-distended, normoactive bowel sounds with no palpable organomegaly  Musc:  No cyanosis, clubbing, atrophy or deformities.  Skin:  No rashes, bruising or ulcers.   Neuro: Awake and alert. Generally a non focal exam. Follows commands appropriately  Psych:  Has a fair insight and is oriented x 3    Diagnostic testing:    Laboratory data reviewed and independently interpreted:    Recent Labs     05/11/23  0200 05/12/23  0217   WBC 8.5 7.6   HGB 10.1* 9.4*   HCT 29.8* 27.0*   RBC 3.47* 3.22*   MCV 85.9 83.9   MCH 29.1 29.2   PLT 421* 353     No results found for: "LACTA"  Recent Labs     05/10/23  0458 05/11/23  0200 05/12/23  0217   NA 139 139 138   K 3.8 3.9 3.9   CL 111* 110* 108   CO2 21 22 25    GLUCOSE 169* 124* 74   BUN 26* 21* 16   CREATININE 1.42* 1.31* 1.14*   CALCIUM 8.1* 8.5 8.1*   MG 1.9 1.8 1.7   PHOS 3.6  --   --    BILITOT  --  0.3 0.4   ALKPHOS  --  90 81   AST  --  28 38*   ALT  --  24 28     No components found for: "GLUCOSEPOC"  Lab Results   Component Value Date/Time    CHOL 341 09/16/2022 10:30 AM    TRIG 1,573 09/16/2022 10:30 AM    HDL 33 09/16/2022 10:30 AM       Imaging data reviewed:    CT ABDOMEN PELVIS W IV CONTRAST Additional Contrast? None    Result Date: 05/08/2023  No acute abnormality. Status post cholecystectomy. Electronically signed by Carola Rhine    Cultures, Imaging studies reviewed and reports noted, Telemetry reviewed and independently interpreted by me and available notes from other care providers - all reviewed by me on: 05/12/2023    Assessment and Plan:    Intractable nausea  and vomiting POA: CT imaging showed fecal stasis. She is sp cholecystectomy. Prior EGD, colonoscopy and gastric emptying scan neg for acute changes. She still could have DM gastroparesis. Seen by GI who plan and an EGD 8/2. Continue IV pepcid, bowel regimen     Diabetes mellitus type 2 with complications POA: A1c 7.4. Blood glucose stable. Had episodes of low BG. Hold Lantus  but continue sliding scale lispro. ADAT    HTN (hypertension), benign POA: BP variable. Continue Amlodipine, Metoprolol    Urinary Tract Infection POA: cultures isolated Strep beta hemolytic group B. She has minimal symptoms. Would DC fluconazole.     CKD stage 3 due to type 2 diabetes mellitus POA: SCR stable. Continue IV fluids.     Care Plan discussed with: Patient, Nursing, CM     Prophylaxis:  Lovenox and H2B/PPI                Anticipated Disposition:  Home w/Family    PCP:      Idowu, Funmilola, APRN - NP     I have personally examined and treated the patient at bedside during this period. To assist coordination of care and communication with nursing and staff, this note may be preliminary early in the day, but finalized by end of the day.         ___________________________________________________    Attending Physician:   Ricard Dillon, MD

## 2023-05-12 NOTE — Progress Notes (Signed)
Ultrasound IV by Tricia Morrow, RN :  Procedure Note    Ultrasound IV education provided to patient. Opportunities for questions given.     Ultrasound used for PIV placement:  20 gauge 5.7 cm AccuCath Ace 5.7cm  right basilic} location.  1 X Attempt(s).    Vigorous blood return present and saline flushes with ease.     Procedure tolerated well. Primary RN aware of IV placement and added to LDA.      Tricia Morrow, RN

## 2023-05-12 NOTE — Progress Notes (Signed)
Patient with challenging vasculature.  Unable to place IV after 4 total attempts from two nurses.  Primary RN made aware    Joanna Hews

## 2023-05-12 NOTE — Progress Notes (Signed)
[name], PA-C                       [phone] office             Monday-Friday 8:00 am-4:30 pm  I am not permitted to use "perfect serve" use above for contact, thanks.        Gastroenterology Progress Note    May 12, 2023  Admit Date: 05/08/2023         Narrative Assessment and Plan   42 yo F with history of HTN, hogh cholesterol, and type 2 diabetes on insulin, admitted with abd pain, N/V. CT with moderate fecal stasis, otherwise no acute abnormality. S/p cholecystectomy. UA with 1+ bacteria and yeast. H pylori diagnosed on EGD pathology 01/18/23, s/p bismuth, metronidazole, and doxycycline therapy. Labs today with elevated creatinine 1.42, leukocytosis had resolved yesterday, today's CBC in process. Reports a few liquid bowel movements this morning, no solid ones yet.      05/11/23: Reports persistent epigastric pain, nontender abdomen. Audible belching during encounter. Bm now soft, light brown/yellow.     05/12/23: Persistent severe abdominal pain, appears uncomfortable during my visit today, shifting in the bed trying to get comfortable.      Impression:  Abdominal pain  Nausea/vomiting   Fecal stasis     Plan:  Repeat EGD Friday to rule out any new gastric etiology which as developed since prior EGD in April.   Continue current regimen with Miralax BID, and Sennokot BID for the time being until some solid stool is seen.   Check AM cortisol level to rule out adrenal insufficiency given recurrent abd pain, nausea, vomiting.     Tennis Must Bright, PA-C  Despite laxation symptoms continue.  Will proceed with EGD.   [name], MD   Subjective:   Chief Complaint: abd pain, N/V     History of Present Illness: 42 yo F with history of HTN, high cholesterol, type 2 diabetes, admitted with abdominal pain, nausea, and vomiting. She was last discharged 03/21/23 and was feeling better for a little while, took  omeprazole qAM daily and Miralax once every 3 days, but eventually her abd pain, N/V returned. She reports having a soft BM every day. Reports the pain is in the mid epigastrium. She has not taken Senokot or Reglan at home. The only recent change in her medication is  that she recently decreased her insulin dose due to low blood sugars at home.     Today she is appears visibly uncomfortable, shifting in her bed trying to get comfortable. Reports persistent abdominal pain.     Remote Spanish interpreter Dary, ID # [phone], provided translation services for this encounter.         ROS:  The previous review of systems on initial consultation / H&P is noted and reviewed.  Specific changes noted above in HPI.    Current Medications:     Current Facility-Administered Medications   Medication Dose Route Frequency    [Held by provider] insulin glargine (LANTUS) injection vial 12 Units  12 Units SubCUTAneous Daily    sucralfate (CARAFATE) tablet 1 g  1 g Oral 4x Daily AC & HS    acetaminophen (TYLENOL) tablet 1,000 mg  1,000 mg Oral 3 times per day    oxyCODONE (ROXICODONE) immediate release tablet 5 mg  5 mg Oral Q4H PRN    Or    oxyCODONE (ROXICODONE) immediate release tablet 10 mg  10 mg  Oral Q4H PRN    HYDROmorphone (DILAUDID) injection 0.5 mg  0.5 mg IntraVENous Q3H PRN    Or    HYDROmorphone HCl PF (DILAUDID) injection 1 mg  1 mg IntraVENous Q3H PRN    polyethylene glycol (GLYCOLAX) packet 17 g  17 g Oral Daily    labetalol (NORMODYNE) tablet 100 mg  100 mg Oral Q8H PRN    famotidine (PEPCID) 20 mg in sodium chloride (PF) 0.9 % 10 mL injection  20 mg IntraVENous Daily    prochlorperazine (COMPAZINE) injection 10 mg  10 mg IntraVENous Q6H PRN    LORazepam (ATIVAN) injection 1 mg  1 mg IntraVENous Q6H PRN    metoprolol succinate (TOPROL XL) extended release tablet 50 mg  50 mg Oral Daily    [Held by provider] sennosides-docusate sodium (SENOKOT-S) 8.6-50 MG tablet 1 tablet  1 tablet Oral BID    amLODIPine (NORVASC) tablet  10 mg  10 mg Oral Daily    lactated ringers IV soln infusion   IntraVENous Continuous    insulin lispro (HUMALOG,ADMELOG) injection vial 0-8 Units  0-8 Units SubCUTAneous TID WC    insulin lispro (HUMALOG,ADMELOG) injection vial 0-4 Units  0-4 Units SubCUTAneous Nightly    glucose chewable tablet 16 g  4 tablet Oral PRN    dextrose bolus 10% 125 mL  125 mL IntraVENous PRN    Or    dextrose bolus 10% 250 mL  250 mL IntraVENous PRN    glucagon injection 1 mg  1 mg SubCUTAneous PRN    dextrose 10 % infusion   IntraVENous Continuous PRN    metoclopramide (REGLAN) injection 5 mg  5 mg IntraVENous Q8H RT    sodium chloride flush 0.9 % injection 5-40 mL  5-40 mL IntraVENous 2 times per day    sodium chloride flush 0.9 % injection 5-40 mL  5-40 mL IntraVENous PRN    0.9 % sodium chloride infusion   IntraVENous PRN    enoxaparin (LOVENOX) injection 40 mg  40 mg SubCUTAneous Daily    ondansetron (ZOFRAN-ODT) disintegrating tablet 4 mg  4 mg Oral Q8H PRN    Or    ondansetron (ZOFRAN) injection 4 mg  4 mg IntraVENous Q6H PRN    polyethylene glycol (GLYCOLAX) packet 17 g  17 g Oral Daily PRN       Objective:     VITALS:   Last 24hrs VS reviewed since prior progress note. Most recent are:  Vitals:    05/12/23 1510   BP: (!) 142/96   Pulse: 90   Resp: 17   Temp: 98.6 F (37 C)   SpO2: 97%     Temp (24hrs), Avg:98.1 F (36.7 C), Min:97.7 F (36.5 C), Max:98.6 F (37 C)      Intake/Output Summary (Last 24 hours) at 05/12/2023 1617  Last data filed at 05/12/2023 0841  Gross per 24 hour   Intake 2474.49 ml   Output --   Net 2474.49 ml       EXAM:  General:  Uncomfortable, no distress    HEENT:  Atraumatic skull, pupils equal  Lungs:   No cough or increased work of breathing. Speaking in complete sentences  Musc:   No skeletal defects or deformities  Neurologic:   Cranial nerves grossly intact, moves all 4 extremities  Psych:    Good insight. Not anxious nor agitated  Derm:   No rash, jaundice, nor palpable dermatologic mass    Lab  Data Reviewed:   Recent Labs  05/11/23  0200 05/12/23  0217   WBC 8.5 7.6   HGB 10.1* 9.4*   HCT 29.8* 27.0*   PLT 421* 353     Recent Labs     05/10/23  0458 05/11/23  0200 05/12/23  0217   NA 139 139 138   K 3.8 3.9 3.9   CL 111* 110* 108   CO2 21 22 25    BUN 26* 21* 16   MG 1.9 1.8 1.7   PHOS 3.6  --   --    ALT  --  24 28     No results found for: "GLUCPOC"  No results for input(s): "PH", "PCO2", "PO2", "HCO3", "FIO2" in the last 72 hours.  No results for input(s): "INR" in the last 72 hours.        Assessment:   (See above)  Principal Problem:    Intractable nausea and vomiting  Active Problems:    Diabetes mellitus type 2 with complications (HCC)    HTN (hypertension), benign    CKD stage 3 due to type 2 diabetes mellitus (HCC)  Resolved Problems:    * No resolved hospital problems. *      Plan:   (See above)      Signed By: [name], PA-C     05/12/2023  4:17 PM

## 2023-05-12 NOTE — Progress Notes (Signed)
[facility]  Date of Birth: 1981-07-21          Assessment & Plan:     CKD 3a likely DN  Proteinuria  DM2 with retinopathy  HTN  N/V    Rec:  Renal function stable  ANA neg, other seros pending  OK for d/c from renal standpoint  Would benefit from nephrology f/u  Ultimately, would benefit from ARB and SGLT2i       Subjective:   CC: Elevated Cr  HPI: Labs stable. Seros pending. No n/v today and wants to go home  Current Facility-Administered Medications   Medication Dose Route Frequency    [Held by provider] insulin glargine (LANTUS) injection vial 12 Units  12 Units SubCUTAneous Daily    sucralfate (CARAFATE) tablet 1 g  1 g Oral 4x Daily AC & HS    acetaminophen (TYLENOL) tablet 1,000 mg  1,000 mg Oral 3 times per day    oxyCODONE (ROXICODONE) immediate release tablet 5 mg  5 mg Oral Q4H PRN    Or    oxyCODONE (ROXICODONE) immediate release tablet 10 mg  10 mg Oral Q4H PRN    HYDROmorphone (DILAUDID) injection 0.5 mg  0.5 mg IntraVENous Q3H PRN    Or    HYDROmorphone HCl PF (DILAUDID) injection 1 mg  1 mg IntraVENous Q3H PRN    polyethylene glycol (GLYCOLAX) packet 17 g  17 g Oral Daily    labetalol (NORMODYNE) tablet 100 mg  100 mg Oral Q8H PRN    famotidine (PEPCID) 20 mg in sodium chloride (PF) 0.9 % 10 mL injection  20 mg IntraVENous Daily    prochlorperazine (COMPAZINE) injection 10 mg  10 mg IntraVENous Q6H PRN    LORazepam (ATIVAN) injection 1 mg  1 mg IntraVENous Q6H PRN    metoprolol succinate (TOPROL XL) extended release tablet 50 mg  50 mg Oral Daily    [Held by provider] sennosides-docusate sodium (SENOKOT-S) 8.6-50 MG tablet 1 tablet  1 tablet Oral BID    amLODIPine (NORVASC) tablet 10 mg  10 mg Oral Daily    lactated ringers IV soln infusion   IntraVENous Continuous    insulin lispro (HUMALOG,ADMELOG) injection vial 0-8 Units  0-8 Units SubCUTAneous TID WC    insulin lispro (HUMALOG,ADMELOG) injection vial 0-4 Units  0-4  Units SubCUTAneous Nightly    glucose chewable tablet 16 g  4 tablet Oral PRN    dextrose bolus 10% 125 mL  125 mL IntraVENous PRN    Or    dextrose bolus 10% 250 mL  250 mL IntraVENous PRN    glucagon injection 1 mg  1 mg SubCUTAneous PRN    dextrose 10 % infusion   IntraVENous Continuous PRN    metoclopramide (REGLAN) injection 5 mg  5 mg IntraVENous Q8H RT    sodium chloride flush 0.9 % injection 5-40 mL  5-40 mL IntraVENous 2 times per day    sodium chloride flush 0.9 % injection 5-40 mL  5-40 mL IntraVENous PRN    0.9 % sodium chloride infusion   IntraVENous PRN    enoxaparin (LOVENOX) injection 40 mg  40 mg SubCUTAneous Daily    ondansetron (ZOFRAN-ODT) disintegrating tablet 4 mg  4 mg Oral Q8H PRN    Or    ondansetron (ZOFRAN) injection 4 mg  4 mg IntraVENous Q6H PRN    polyethylene glycol (GLYCOLAX) packet 17 g  17 g Oral Daily PRN  Objective:     Vitals:  Blood pressure (!) 151/87, pulse 93, temperature 97.9 F (36.6 C), temperature source Oral, resp. rate 17, height 1.499 m (4\' 11" ), weight 60 kg (132 lb 4.4 oz), last menstrual period 05/07/2023, SpO2 100 %.  Temp (24hrs), Avg:98.1 F (36.7 C), Min:97.9 F (36.6 C), Max:98.2 F (36.8 C)      Intake and Output:  08/01 0701 - 08/01 1900  In: 1339.9 [P.O.:123; I.V.:1216.9]  Out: -   07/30 1901 - 08/01 0700  In: 2296.7 [I.V.:1413.9]  Out: -     Physical Exam:               GENERAL ASSESSMENT: NAD  HEENT: Nontraumatic   CHEST: CTA  HEART: S1S2  ABDOMEN: Soft,NT  EXTREMITY: no EDEMA  NEURO: Grossly non focal          ECG/rhythm:    Data Review        Recent Labs     05/10/23  0458 05/11/23  0200 05/12/23  0217   NA 139 139 138   K 3.8 3.9 3.9   CL 111* 110* 108   CO2 21 22 25    BUN 26* 21* 16   CREATININE 1.42* 1.31* 1.14*   GLUCOSE 169* 124* 74   CALCIUM 8.1* 8.5 8.1*             : Laureen Ochs, MD  05/12/2023        [facility]:  [email or web address]  [email or web address]    [place] office:  [address] [place], [address]  [place], [place] [address]  Phone: [phone]  Fax :     [phone]    [facility] office:  [address]  [place], [place] [address]  Phone - [phone]  Fax - [phone]

## 2023-05-12 NOTE — Progress Notes (Signed)
Spoke with provider and He wants to hold Lantus due to patients current condition. Pt. Humalog was also held due to [facility] is not within parameter so none is needed.

## 2023-05-12 NOTE — Care Coordination-Inpatient (Signed)
Transition of Care Plan:    RUR: 28% High  Prior Level of Functioning: Independent  Disposition: Home  Follow up appointments: PCP, Specialist  DME needed: NA  Transportation at discharge: Family  IM/IMM Medicare/Tricare letter given: NA  Emergency Contact: Carmon Sails, [phone]  Discharge Caregiver contacted prior to discharge? NA  Care Conference needed? NA  Barriers to discharge: NA    CM continuing to follow patient for transitional care planning needs. Patient is planned for EGD tomorrow and possible DC tomorrow or Saturday. GI and nephrology following. There are no identified CM needs at this time. CM to monitor.    Suzan Slick [place], [place]  [phone]

## 2023-05-13 LAB — ANCA PANEL
Atypical pANCA: <1:20 {titer}
Cytoplasmic (C-ANCA): <1:20 {titer}
Myeloperoxidase Ab: <0.2 units (ref 0.0–0.9)
Perinuclear (P-ANCA): <1:20 {titer}
Proteinase 3 AB: <0.2 units (ref 0.0–0.9)

## 2023-05-13 LAB — ELECTROPHORESIS PROTEIN, SERUM
Albumin/Globulin Ratio: 1 (ref 0.7–1.7)
Albumin: 2.8 g/dL — ABNORMAL LOW (ref 2.9–4.4)
Alpha-1-Globulin: 0.2 g/dL (ref 0.0–0.4)
Alpha-2-Globulin: 1 g/dL (ref 0.4–1.0)
Beta Globulin: 0.8 g/dL (ref 0.7–1.3)
Gamma Globulin: 0.8 g/dL (ref 0.4–1.8)
Globulin: 2.8 g/dL (ref 2.2–3.9)
Total Protein: 5.6 g/dL — ABNORMAL LOW (ref 6.0–8.5)

## 2023-05-13 LAB — CBC WITH AUTO DIFFERENTIAL
Basophils %: 1 % (ref 0–1)
Basophils Absolute: 0.1 10*3/uL (ref 0.0–0.1)
Eosinophils %: 4 % (ref 0–7)
Eosinophils Absolute: 0.3 10*3/uL (ref 0.0–0.4)
Hematocrit: 32.1 % — ABNORMAL LOW (ref 35.0–47.0)
Hemoglobin: 10.9 g/dL — ABNORMAL LOW (ref 11.5–16.0)
Immature Granulocytes %: 0 % (ref 0.0–0.5)
Immature Granulocytes Absolute: 0 10*3/uL (ref 0.00–0.04)
Lymphocytes %: 30 % (ref 12–49)
Lymphocytes Absolute: 2.7 10*3/uL (ref 0.8–3.5)
MCH: 28.6 PG (ref 26.0–34.0)
MCHC: 34 g/dL (ref 30.0–36.5)
MCV: 84.3 FL (ref 80.0–99.0)
MPV: 8.9 FL (ref 8.9–12.9)
Monocytes %: 9 % (ref 5–13)
Monocytes Absolute: 0.8 10*3/uL (ref 0.0–1.0)
Neutrophils %: 56 % (ref 32–75)
Neutrophils Absolute: 5 10*3/uL (ref 1.8–8.0)
Nucleated RBCs: 0 PER 100 WBC
Platelets: 431 10*3/uL — ABNORMAL HIGH (ref 150–400)
RBC: 3.81 M/uL (ref 3.80–5.20)
RDW: 13 % (ref 11.5–14.5)
WBC: 9 10*3/uL (ref 3.6–11.0)
nRBC: 0 10*3/uL (ref 0.00–0.01)

## 2023-05-13 LAB — COMPREHENSIVE METABOLIC PANEL
ALT: 44 U/L (ref 12–78)
AST: 45 U/L — ABNORMAL HIGH (ref 15–37)
Albumin/Globulin Ratio: 0.8 — ABNORMAL LOW (ref 1.1–2.2)
Albumin: 2.6 g/dL — ABNORMAL LOW (ref 3.5–5.0)
Alk Phosphatase: 93 U/L (ref 45–117)
Anion Gap: 6 mmol/L (ref 5–15)
BUN/Creatinine Ratio: 12 (ref 12–20)
BUN: 14 MG/DL (ref 6–20)
CO2: 26 mmol/L (ref 21–32)
Calcium: 8.6 MG/DL (ref 8.5–10.1)
Chloride: 105 mmol/L (ref 97–108)
Creatinine: 1.13 MG/DL — ABNORMAL HIGH (ref 0.55–1.02)
Est, Glom Filt Rate: 63 mL/min/{1.73_m2} (ref >60)
Globulin: 3.1 g/dL (ref 2.0–4.0)
Glucose: 106 mg/dL — ABNORMAL HIGH (ref 65–100)
Potassium: 3.6 mmol/L (ref 3.5–5.1)
Sodium: 137 mmol/L (ref 136–145)
Total Bilirubin: 0.7 MG/DL (ref 0.2–1.0)
Total Protein: 5.7 g/dL — ABNORMAL LOW (ref 6.4–8.2)

## 2023-05-13 LAB — CORTISOL AM, TOTAL: Cortisol - AM: 32.8 ug/dL — ABNORMAL HIGH (ref 4.30–22.45)

## 2023-05-13 LAB — POCT GLUCOSE
POC Glucose: 135 mg/dL — ABNORMAL HIGH (ref 65–117)
POC Glucose: 110 mg/dL (ref 65–117)
POC Glucose: 109 mg/dL (ref 65–117)
POC Glucose: 136 mg/dL — ABNORMAL HIGH (ref 65–117)

## 2023-05-13 LAB — GLOMERULAR BASEMENT MEMBRANE ANTIBODIES: Glomerular Basement Membrane Ab, IgG: <0.2 units (ref 0.0–0.9)

## 2023-05-13 LAB — C3 COMPLEMENT: C3 Complement: 96 mg/dL (ref 82–167)

## 2023-05-13 LAB — C4 COMPLEMENT: C4 Complement: 21 mg/dL (ref 12–38)

## 2023-05-13 LAB — MAGNESIUM: Magnesium: 1.8 mg/dL (ref 1.6–2.4)

## 2023-05-13 MED ORDER — PROPOFOL 200 MG/20ML IV EMUL
200 MG/20ML | INTRAVENOUS | Status: DC | PRN
  Administered 2023-05-13: 18:00:00 150 via INTRAVENOUS
  Administered 2023-05-13: 18:00:00 50 via INTRAVENOUS

## 2023-05-13 MED ORDER — AMITRIPTYLINE HCL 10 MG PO TABS
10 | Freq: Every evening | ORAL | Status: DC
Start: 2023-05-13 — End: 2023-05-14
  Administered 2023-05-14: 02:00:00 10 mg via ORAL

## 2023-05-13 MED ORDER — LIDOCAINE HCL 2 % IJ SOLN (MIXTURES ONLY)
2 % | INTRAMUSCULAR | Status: DC | PRN
  Administered 2023-05-13: 18:00:00 100 via INTRAVENOUS

## 2023-05-13 MED ORDER — SODIUM CHLORIDE 0.9 % IV SOLN
0.9 | INTRAVENOUS | Status: DC
Start: 2023-05-13 — End: 2023-05-13

## 2023-05-13 MED ORDER — NORMAL SALINE FLUSH 0.9 % IV SOLN
0.9 | INTRAVENOUS | Status: DC | PRN
Start: 2023-05-13 — End: 2023-05-13

## 2023-05-13 MED ORDER — SODIUM CHLORIDE 0.9 % IV SOLN
0.9 % | INTRAVENOUS | Status: DC | PRN
  Administered 2023-05-13: 18:00:00 via INTRAVENOUS

## 2023-05-13 MED FILL — HYDROMORPHONE HCL 1 MG/ML IJ SOLN: 1 MG/ML | INTRAMUSCULAR | Qty: 1

## 2023-05-13 MED FILL — PROCHLORPERAZINE EDISYLATE 10 MG/2ML IJ SOLN: 10 MG/2ML | INTRAMUSCULAR | Qty: 2

## 2023-05-13 MED FILL — AMLODIPINE BESYLATE 5 MG PO TABS: 5 MG | ORAL | Qty: 2

## 2023-05-13 MED FILL — METOPROLOL SUCCINATE ER 50 MG PO TB24: 50 MG | ORAL | Qty: 1

## 2023-05-13 MED FILL — SUCRALFATE 1 G PO TABS: 1 GM | ORAL | Qty: 1

## 2023-05-13 MED FILL — ONDANSETRON HCL 4 MG/2ML IJ SOLN: 4 MG/2ML | INTRAMUSCULAR | Qty: 2

## 2023-05-13 MED FILL — ONDANSETRON 4 MG PO TBDP: 4 MG | ORAL | Qty: 1

## 2023-05-13 MED FILL — POLYETHYLENE GLYCOL 3350 17 G PO PACK: 17 g | ORAL | Qty: 1

## 2023-05-13 MED FILL — ACETAMINOPHEN EXTRA STRENGTH 500 MG PO TABS: 500 MG | ORAL | Qty: 2

## 2023-05-13 MED FILL — OXYCODONE HCL 5 MG PO TABS: 5 MG | ORAL | Qty: 2

## 2023-05-13 MED FILL — METOCLOPRAMIDE HCL 5 MG/ML IJ SOLN: 5 MG/ML | INTRAMUSCULAR | Qty: 2

## 2023-05-13 MED FILL — SODIUM CHLORIDE 0.9 % IV SOLN: 0.9 % | INTRAVENOUS | Qty: 1000

## 2023-05-13 MED FILL — FAMOTIDINE (PF) 20 MG/2ML IV SOLN: 20 MG/2ML | INTRAVENOUS | Qty: 2

## 2023-05-13 NOTE — Other (Signed)
Initial RN admission and assessment performed and documented in Endoscopy navigator.     Patient evaluated by anesthesia in pre-procedure holding.     All procedural vital signs, airway assessment, and level of consciousness information monitored and recorded by anesthesia staff on the anesthesia record.     Report received from CRNA post procedure.  Patient transported to recovery area by RN.    Endoscopy post procedure time out was performed and specimens were verified with physician.    Endoscope was pre-cleaned at bedside immediately following procedure by Cherlynn Polo.

## 2023-05-13 NOTE — Anesthesia Pre-Procedure Evaluation (Signed)
Department of Anesthesiology  Preprocedure Note       Name:  Melinda [name]   Age:  42 y.o.  DOB:  May 30, 1981                                          MRN:  [id number]         Date:  05/13/2023      Surgeon: Moishe Spice):  Leta Speller, MD    Procedure: Procedure(s):  ESOPHAGOGASTRODUODENOSCOPY  ESOPHAGOGASTRODUODENOSCOPY BIOPSY    Medications prior to admission:   Prior to Admission medications    Medication Sig Start Date End Date Taking? Authorizing Provider   omeprazole (PRILOSEC) 10 MG delayed release capsule Take 1 capsule by mouth daily   Yes [provider]   ondansetron (ZOFRAN-ODT) 4 MG disintegrating tablet Take 1 tablet by mouth every 8 hours as needed for Nausea or Vomiting 03/23/23   Do, Rozell Searing B, MD   lisinopril (PRINIVIL;ZESTRIL) 40 MG tablet Take 1 tablet by mouth daily 03/23/23   Do, Khoi B, MD   metoprolol succinate (TOPROL XL) 50 MG extended release tablet Take 1 tablet by mouth daily 03/23/23   Do, Khoi B, MD   amLODIPine (NORVASC) 10 MG tablet Take 1 tablet by mouth daily 03/23/23   Do, Khoi B, MD   polyethylene glycol (GLYCOLAX) 17 g packet Take 1 packet by mouth 2 times daily 03/23/23   Do, Khoi B, MD   sennosides-docusate sodium (SENOKOT-S) 8.6-50 MG tablet Take 1 tablet by mouth in the morning and at bedtime 03/23/23   Do, Khoi B, MD   metoclopramide (REGLAN) 5 MG tablet Take 1 tablet by mouth 3 times daily (before meals) 03/23/23   Do, Twanna Hy, MD   pantoprazole (PROTONIX) 40 MG tablet Take 1 tablet by mouth 2 times daily (before meals) 03/23/23   Do, Khoi B, MD   insulin 70-30 (NOVOLIN 70/30) (70-30) 100 UNIT per ML injection vial Inject 15 Units into the skin 2 times daily  Patient taking differently: Inject into the skin 2 times daily (before meals) 40 units AM, 25 units PM 02/14/23 03/16/23  Dobransky, Kathlen Mody, MD   naloxone [facility]) 4 MG/0.1ML LIQD nasal spray 1 spray by Nasal route as needed for Opioid Reversal 01/20/23   Horald Chestnut, DO       Current medications:    Current  Facility-Administered Medications   Medication Dose Route Frequency Provider Last Rate Last Admin    0.9 % sodium chloride infusion   IntraVENous Continuous [name] D, MD        sodium chloride flush 0.9 % injection 5-40 mL  5-40 mL IntraVENous PRN Leta Speller, MD        [Held by provider] insulin glargine (LANTUS) injection vial 12 Units  12 Units SubCUTAneous Daily Ellard Artis, MD   12 Units at 05/11/23 6578    acetaminophen (TYLENOL) tablet 1,000 mg  1,000 mg Oral 3 times per day Ellard Artis, MD   1,000 mg at 05/13/23 4696    oxyCODONE (ROXICODONE) immediate release tablet 5 mg  5 mg Oral Q4H PRN Ellard Artis, MD        Or    oxyCODONE (ROXICODONE) immediate release tablet 10 mg  10 mg Oral Q4H PRN Ellard Artis, MD   10 mg at 05/13/23 0557    HYDROmorphone (DILAUDID) injection  0.5 mg  0.5 mg IntraVENous Q3H PRN Ellard Artis, MD   0.5 mg at 05/11/23 1403    Or    HYDROmorphone HCl PF (DILAUDID) injection 1 mg  1 mg IntraVENous Q3H PRN Ellard Artis, MD   1 mg at 05/13/23 1243    polyethylene glycol (GLYCOLAX) packet 17 g  17 g Oral Daily Ellard Artis, MD        labetalol (NORMODYNE) tablet 100 mg  100 mg Oral Q8H PRN Ellard Artis, MD        famotidine (PEPCID) 20 mg in sodium chloride (PF) 0.9 % 10 mL injection  20 mg IntraVENous Daily Do, Khoi B, MD   20 mg at 05/13/23 0954    prochlorperazine (COMPAZINE) injection 10 mg  10 mg IntraVENous Q6H PRN Norton Pastel, MD   10 mg at 05/13/23 1003    LORazepam (ATIVAN) injection 1 mg  1 mg IntraVENous Q6H PRN Norton Pastel, MD        metoprolol succinate (TOPROL XL) extended release tablet 50 mg  50 mg Oral Daily Norton Pastel, MD   50 mg at 05/13/23 0954    [Held by provider] sennosides-docusate sodium (SENOKOT-S) 8.6-50 MG tablet 1 tablet  1 tablet Oral BID Norton Pastel, MD   1 tablet at 05/11/23 0924    amLODIPine (NORVASC) tablet 10 mg  10 mg Oral Daily Norton Pastel, MD   10 mg at 05/13/23 6962    lactated ringers  IV soln infusion   IntraVENous Continuous Melynda Keller, MD 100 mL/hr at 05/13/23 1242 Rate Change at 05/13/23 1242    insulin lispro (HUMALOG,ADMELOG) injection vial 0-8 Units  0-8 Units SubCUTAneous TID WC Do, Khoi B, MD        insulin lispro (HUMALOG,ADMELOG) injection vial 0-4 Units  0-4 Units SubCUTAneous Nightly Do, Khoi B, MD        glucose chewable tablet 16 g  4 tablet Oral PRN Do, Khoi B, MD        dextrose bolus 10% 125 mL  125 mL IntraVENous PRN Do, Khoi B, MD        Or    dextrose bolus 10% 250 mL  250 mL IntraVENous PRN Do, Khoi B, MD        glucagon injection 1 mg  1 mg SubCUTAneous PRN Do, Khoi B, MD        dextrose 10 % infusion   IntraVENous Continuous PRN Do, Khoi B, MD        metoclopramide (REGLAN) injection 5 mg  5 mg IntraVENous Q8H RT Do, Khoi B, MD   5 mg at 05/13/23 0954    sodium chloride flush 0.9 % injection 5-40 mL  5-40 mL IntraVENous 2 times per day Norton Pastel, MD   10 mL at 05/13/23 1002    sodium chloride flush 0.9 % injection 5-40 mL  5-40 mL IntraVENous PRN Norton Pastel, MD        0.9 % sodium chloride infusion   IntraVENous PRN Norton Pastel, MD   Stopped at 05/09/23 1556    [Held by provider] enoxaparin (LOVENOX) injection 40 mg  40 mg SubCUTAneous Daily Norton Pastel, MD   40 mg at 05/12/23 0832    ondansetron (ZOFRAN-ODT) disintegrating tablet 4 mg  4 mg Oral Q8H PRN Norton Pastel, MD   4 mg at 05/13/23 0558    Or    ondansetron [facility]) injection  4 mg  4 mg IntraVENous Q6H PRN Norton Pastel, MD   4 mg at 05/11/23 2032    polyethylene glycol (GLYCOLAX) packet 17 g  17 g Oral Daily PRN Norton Pastel, MD           Allergies:  No Known Allergies    Problem List:    Patient Active Problem List   Diagnosis Code    Nausea and vomiting R11.2    Sepsis (HCC) A41.9    Abdominal pain R10.9    AKI (acute kidney injury) (HCC) N17.9    Leukocytosis D72.829    Elevated lactic acid level R79.89    Tachycardia R00.0    Diabetes mellitus type 2 with complications (HCC) E11.8     HTN (hypertension), benign I10    Hyperlipidemia E78.5    Intractable nausea and vomiting R11.2    Gastroparesis K31.84    CKD stage 3 due to type 2 diabetes mellitus (HCC) E11.22, N18.30       Past Medical History:        Diagnosis Date    High blood pressure     High cholesterol     Type 2 diabetes mellitus (HCC)     Controled       Past Surgical History:        Procedure Laterality Date    CAPSULE ENDOSCOPY N/A 01/20/2023    ESOPHAGEAL CAPSULE ENDOSCOPY remove at 1624PM performed by Glyn Ade, MD at [facility] ENDOSCOPY    CHOLECYSTECTOMY, LAPAROSCOPIC N/A 02/03/2023    ROBOTIC LAPAROSCOPIC CHOLECYSTECTOMY with Indocyanine green performed by Verdis Frederickson, MD at [facility] MAIN OR    COLONOSCOPY N/A 01/19/2023    COLONOSCOPY DIAGNOSTIC performed by Glyn Ade, MD at [facility] ENDOSCOPY    OTHER SURGICAL HISTORY Left     Rentia attachment    TUBAL LIGATION Bilateral     UPPER GASTROINTESTINAL ENDOSCOPY N/A 01/17/2023    ESOPHAGOGASTRODUODENOSCOPY performed by Glyn Ade, MD at [facility] ENDOSCOPY    UPPER GASTROINTESTINAL ENDOSCOPY N/A 01/17/2023    ESOPHAGOGASTRODUODENOSCOPY BIOPSY performed by Glyn Ade, MD at [facility] ENDOSCOPY    UPPER GASTROINTESTINAL ENDOSCOPY N/A 01/18/2023    ESOPHAGOGASTRODUODENOSCOPY performed by Glyn Ade, MD at [facility] ENDOSCOPY    [place] ABSCESS DRAINAGE PERITONEAL  02/11/2023    [place] ABSCESS DRAINAGE PERITONEAL 02/11/2023 [facility] RAD [place]       Social History:    Social History     Tobacco Use    Smoking status: Never    Smokeless tobacco: Never   Substance Use Topics    Alcohol use: Never                                Counseling given: Not Answered      Vital Signs (Current):   Vitals:    05/13/23 0733 05/13/23 0954 05/13/23 1113 05/13/23 1400   BP: (!) 144/93 (!) 144/93 (!) 149/84 (!) 152/81   Pulse: 85 85 87 93   Resp:   18 15   Temp: 98.1 F (36.7 C)  98.4 F (36.9 C) 98.2 F (36.8 C)   TempSrc:   Oral Oral   SpO2: 94%  95% 96%   Weight:       Height:  BP  Readings from Last 3 Encounters:   05/13/23 (!) 152/81   03/23/23 119/84   02/17/23 (!) 200/110       NPO Status: Time of last liquid consumption: 1500                        Time of last solid consumption: 0800                        Date of last liquid consumption: 05/12/23                        Date of last solid food consumption: 05/12/23    BMI:   Wt Readings from Last 3 Encounters:   05/08/23 60 kg (132 lb 4.4 oz)   03/16/23 61.2 kg (135 lb)   02/17/23 61.2 kg (135 lb)     Body mass index is 26.72 kg/m.    CBC:   Lab Results   Component Value Date/Time    WBC 9.0 05/13/2023 11:36 AM    RBC 3.81 05/13/2023 11:36 AM    HGB 10.9 05/13/2023 11:36 AM    HCT 32.1 05/13/2023 11:36 AM    MCV 84.3 05/13/2023 11:36 AM    RDW 13.0 05/13/2023 11:36 AM    PLT 431 05/13/2023 11:36 AM       CMP:   Lab Results   Component Value Date/Time    NA 137 05/13/2023 11:36 AM    K 3.6 05/13/2023 11:36 AM    CL 105 05/13/2023 11:36 AM    CO2 26 05/13/2023 11:36 AM    BUN 14 05/13/2023 11:36 AM    CREATININE 1.13 05/13/2023 11:36 AM    GFRAA >60 07/10/2021 12:01 AM    AGRATIO 0.8 10/22/2021 10:00 AM    LABGLOM 63 05/13/2023 11:36 AM    LABGLOM 58 02/03/2023 02:36 AM    LABGLOM >60 10/22/2021 10:00 AM    GLUCOSE 106 05/13/2023 11:36 AM    CALCIUM 8.6 05/13/2023 11:36 AM    BILITOT 0.7 05/13/2023 11:36 AM    ALKPHOS 93 05/13/2023 11:36 AM    ALKPHOS 98 10/22/2021 10:00 AM    AST 45 05/13/2023 11:36 AM    ALT 44 05/13/2023 11:36 AM       POC Tests:   Recent Labs     05/13/23  1146   POCGLU 109         Coags:   Lab Results   Component Value Date/Time    PROTIME 10.9 03/21/2023 03:00 AM    INR 1.0 03/21/2023 03:00 AM       HCG (If Applicable):   Lab Results   Component Value Date    PREGTESTUR Negative 02/10/2023    PREGSERUM Negative 05/08/2023        ABGs: No results found for: "PHART", "PO2ART", "PCO2ART", "HCO3ART", "BEART", "O2SATART"     Type & Screen (If Applicable):  No results found for: "LABABO"    Drug/Infectious Status (If  Applicable):  Lab Results   Component Value Date/Time    HEPCAB 0.07 03/16/2023 08:19 AM       COVID-19 Screening (If Applicable):   Lab Results   Component Value Date/Time    COVID19 Not detected 06/28/2022 09:52 PM           Anesthesia Evaluation  Patient summary reviewed and Nursing notes reviewed  Airway: Mallampati: II  TM distance: >3 FB  Neck ROM: full  Mouth opening: > = 3 FB   Dental:    (+) poor dentition      Pulmonary:Negative Pulmonary ROS breath sounds clear to auscultation                             Cardiovascular:  Exercise tolerance: good (>4 METS)  (+) hypertension:        Rhythm: regular  Rate: normal           Beta Blocker:  Not on Beta Blocker         Neuro/Psych:   Negative Neuro/Psych ROS              GI/Hepatic/Renal:   (+) bowel prep          Endo/Other:    (+) Diabetes.          Pt had no PAT visit       Abdominal:             Vascular: negative vascular ROS.         Other Findings:           Anesthesia Plan      MAC     ASA 2       Induction: intravenous.      Anesthetic plan and risks discussed with patient.      Plan discussed with CRNA.                  Cherlynn Kaiser, MD   05/13/2023

## 2023-05-13 NOTE — Progress Notes (Signed)
Patient/caregivers speak Spanish as their preferred language for their healthcare communication. For safe communication, use the [employer] interpreter carts or call:    Maria Claudia Oystese   Senior Interpreter-Navigator [phone]  General phone: [phone] ( [phone])  Email: [email or web address]    Please always document the use of interpreting services (Interpreter's ID number) in your clinical notes.    Our interpreters are available for team members working with limited  English proficient (LEP) patients remotely, via phone or video or in person (if needed for special cases).

## 2023-05-13 NOTE — Progress Notes (Signed)
TRANSFER - OUT REPORT:    Verbal report given to [facility] RN on Melinda [name]  being transferred to 366 for routine progression of patient care       Report consisted of patient's Situation, Background, Assessment and   Recommendations(SBAR).     Information from the following report(s) Nurse Handoff Report and Recent Results was reviewed with the receiving nurse.           Lines:   Extended Dwell Peripherial IV 05/12/23 Right Basilic (Active)   Criteria for Appropriate Use Limited/no vessel suitable for conventional peripheral access 05/13/23 0852   Site Assessment Clean, dry & intact 05/13/23 1429   Phlebitis Assessment No symptoms 05/13/23 1429   Infiltration Assessment 0 05/13/23 1429   Line Status Infusing 05/13/23 1429   Line Care Connections checked and tightened 05/13/23 1429   Alcohol Cap Used Yes 05/13/23 1404   Date of Last Dressing Change 05/12/23 05/12/23 1412   Dressing Type Transparent 05/13/23 1429   Dressing Status Clean, dry & intact 05/13/23 1429   Dressing Intervention New 05/12/23 1412        Opportunity for questions and clarification was provided.      Patient transported with:  chart

## 2023-05-13 NOTE — Consults (Signed)
Session ID: [id number]  Language: Spanish  Interpreter ID: #[id number]  Interpreter Name: Elmyra Ricks

## 2023-05-13 NOTE — Progress Notes (Signed)
[facility]  [address] [place], [place] [address]  [id number]      Hospitalist  Progress Note      NAME:       Melinda [name]   DOB:        1981/08/16  MRM:        [id number]    Date of service: 05/13/2023      Subjective: Patient seen and examined by me. Patient admitted with abdominal pain. She says she has upper abdominal discomfort today. Awaiting endoscopy. She denies any vomiting. No fever or chills. Denies any dysuria or frequency.  Discussed with her through a video interpretor    Objective:    Vital Signs:    BP (!) 149/84   Pulse 87   Temp 98.4 F (36.9 C) (Oral)   Resp 18   Ht 1.499 m (4\' 11" )   Wt 60 kg (132 lb 4.4 oz)   LMP 05/07/2023   SpO2 95%   BMI 26.72 kg/m        Intake/Output Summary (Last 24 hours) at 05/13/2023 1146  Last data filed at 05/13/2023 1002  Gross per 24 hour   Intake 1205.85 ml   Output --   Net 1205.85 ml          Current inpatient medications reviewed:  Current Facility-Administered Medications   Medication Dose Route Frequency    [Held by provider] insulin glargine (LANTUS) injection vial 12 Units  12 Units SubCUTAneous Daily    acetaminophen (TYLENOL) tablet 1,000 mg  1,000 mg Oral 3 times per day    oxyCODONE (ROXICODONE) immediate release tablet 5 mg  5 mg Oral Q4H PRN    Or    oxyCODONE (ROXICODONE) immediate release tablet 10 mg  10 mg Oral Q4H PRN    HYDROmorphone (DILAUDID) injection 0.5 mg  0.5 mg IntraVENous Q3H PRN    Or    HYDROmorphone HCl PF (DILAUDID) injection 1 mg  1 mg IntraVENous Q3H PRN    polyethylene glycol (GLYCOLAX) packet 17 g  17 g Oral Daily    labetalol (NORMODYNE) tablet 100 mg  100 mg Oral Q8H PRN    famotidine (PEPCID) 20 mg in sodium chloride (PF) 0.9 % 10 mL injection  20 mg IntraVENous Daily    prochlorperazine (COMPAZINE) injection 10 mg  10 mg IntraVENous Q6H PRN    LORazepam (ATIVAN) injection 1 mg  1 mg IntraVENous Q6H PRN     metoprolol succinate (TOPROL XL) extended release tablet 50 mg  50 mg Oral Daily    [Held by provider] sennosides-docusate sodium (SENOKOT-S) 8.6-50 MG tablet 1 tablet  1 tablet Oral BID    amLODIPine (NORVASC) tablet 10 mg  10 mg Oral Daily    lactated ringers IV soln infusion   IntraVENous Continuous    insulin lispro (HUMALOG,ADMELOG) injection vial 0-8 Units  0-8 Units SubCUTAneous TID WC    insulin lispro (HUMALOG,ADMELOG) injection vial 0-4 Units  0-4 Units SubCUTAneous Nightly    glucose chewable tablet 16 g  4 tablet Oral PRN    dextrose bolus 10% 125 mL  125 mL IntraVENous PRN    Or    dextrose bolus 10% 250 mL  250 mL IntraVENous PRN    glucagon injection 1 mg  1 mg SubCUTAneous PRN    dextrose 10 % infusion   IntraVENous Continuous PRN    metoclopramide (REGLAN) injection 5 mg  5 mg IntraVENous Q8H RT    sodium  chloride flush 0.9 % injection 5-40 mL  5-40 mL IntraVENous 2 times per day    sodium chloride flush 0.9 % injection 5-40 mL  5-40 mL IntraVENous PRN    0.9 % sodium chloride infusion   IntraVENous PRN    [Held by provider] enoxaparin (LOVENOX) injection 40 mg  40 mg SubCUTAneous Daily    ondansetron (ZOFRAN-ODT) disintegrating tablet 4 mg  4 mg Oral Q8H PRN    Or    ondansetron (ZOFRAN) injection 4 mg  4 mg IntraVENous Q6H PRN    polyethylene glycol (GLYCOLAX) packet 17 g  17 g Oral Daily PRN       Physical Examination:    General:   Weak and ill looking patient in no acute distress  Eyes:   pink conjunctivae, PERRLA with no discharge.  ENT:   no ottorrhea or rhinorrhea with dry mucous membranes  Pulm: decreased but clear breath sounds without crackles or wheezes  Card:  no JVD or murmurs, has regular and normal S1, S2 without thrills, bruits or peripheral edema  Abd:  Soft, epigastric discomfort, non-distended, normoactive bowel sounds.   Musc:  No cyanosis, clubbing, atrophy, Swollen LUE   Skin:  No rashes, bruising or ulcers.   Neuro: Awake and alert. Generally a non focal exam. Follows  commands appropriately  Psych:  Has a fair insight and is oriented x 3    Diagnostic testing:    Laboratory data reviewed and independently interpreted:    Recent Labs     05/11/23  0200 05/12/23  0217   WBC 8.5 7.6   HGB 10.1* 9.4*   HCT 29.8* 27.0*   RBC 3.47* 3.22*   MCV 85.9 83.9   MCH 29.1 29.2   PLT 421* 353       No results found for: "LACTA"  Recent Labs     05/11/23  0200 05/12/23  0217   NA 139 138   K 3.9 3.9   CL 110* 108   CO2 22 25   GLUCOSE 124* 74   BUN 21* 16   CREATININE 1.31* 1.14*   CALCIUM 8.5 8.1*   MG 1.8 1.7   BILITOT 0.3 0.4   ALKPHOS 90 81   AST 28 38*   ALT 24 28       No components found for: "GLUCOSEPOC"  Lab Results   Component Value Date/Time    CHOL 341 09/16/2022 10:30 AM    TRIG 1,573 09/16/2022 10:30 AM    HDL 33 09/16/2022 10:30 AM       Imaging data reviewed:    CT ABDOMEN PELVIS W IV CONTRAST Additional Contrast? None    Result Date: 05/08/2023  No acute abnormality. Status post cholecystectomy. Electronically signed by Carola Rhine    Cultures, Imaging studies reviewed and reports noted, Telemetry reviewed and independently interpreted by me and available notes from other care providers - all reviewed by me on: 05/13/2023    Assessment and Plan:    Intractable nausea and vomiting POA: CT imaging showed fecal stasis. She is sp cholecystectomy. Prior EGD, colonoscopy and gastric emptying scan neg for acute changes. She still could have DM gastroparesis. Seen by GI who plan and an EGD today. Keep NPO. IV fluids, IV pepcid, bowel regimen. Await endoscopic testing.     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 Blood glucose stable. Now NPO. Monitor blood glucose. Sliding scale lispro.     HTN (hypertension), benign POA: BP variable. Continue Amlodipine,  Metoprolol    Urinary Tract Infection POA: cultures isolated Strep beta hemolytic group B. She has minimal symptoms. 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 Continue IV fluids.      Care Plan discussed with: Patient, Nursing, CM     Prophylaxis:  Lovenox and H2B/PPI                Anticipated Disposition:  Home w/Family    PCP:      Idowu, Funmilola, APRN - NP     I have personally examined and treated the patient at bedside during this period. To assist coordination of care and communication with nursing and staff, this note may be preliminary early in the day, but finalized by end of the day.         ___________________________________________________    Attending Physician:   Ricard Dillon, MD

## 2023-05-13 NOTE — Consults (Signed)
Session ID: [id number]  Language: Spanish  Interpreter ID: #[id number]  Interpreter Name: Doreatha Martin

## 2023-05-13 NOTE — Other (Signed)
TRANSFER - IN REPORT:    Verbal report received from [facility] on Melinda [name]  being received from 366 for ordered procedure      Report consisted of patient's Situation, Background, Assessment and   Recommendations(SBAR).     Information from the following report(s) Pre Procedure Checklist and Procedure Verification was reviewed with the receiving nurse.    Opportunity for questions and clarification was provided.      Assessment completed upon patient's arrival to unit and care assumed.

## 2023-05-13 NOTE — Discharge Instructions (Addendum)
Hospital Medicine DISCHARGE INSTRUCTIONS    NAME: Melinda [name]   DOB:  [birth year]   MRN:  [id number]     Date:     05/14/2023    Admission date: 05/08/2023     Discharge date:  05/14/2023     Reason for your admission:  Hyperglycemia [R73.9]  Elevated serum creatinine [R79.89]  Intractable abdominal pain [R10.9]  Intractable nausea and vomiting [R11.2]  Nausea and vomiting, unspecified vomiting type [R11.2]    Discharge Diagnoses:  As above    DISCHARGE INSTRUCTIONS:    Thank you for allowing [place] to participate in your care. Your discharging Hospitalist is Ricard Dillon, MD. [name] were admitted for evaluation and treatment for the above diagnoses.    Medications:     It is important that medications are taken exactly as they are prescribed on the discharge medication instructions and keep them your  in the bottles provided by the pharmacist.   Keep a list of the medication names, dosages, and times to be taken at all times.    Do not take other medications without consulting your doctor.     Recommended diet:  small diabetic easy to chew frequent meals    Recommended activity: activity as tolerated    Post discharge care:    For questions regarding your Hospitalization or to contact the Hospital Medicine team, please call [phone].    Notify follow up health care provider or return to the emergency department if you cannot get hold of your doctor if you feel worse or experience symptoms similar to those that brought you to hospital    Idowu, Sharion Dove, APRN - NP  [address]  [place] [address]  [phone]    Schedule an appointment as soon as possible for a visit  to schedule a regular follow up after discharge       Information obtained by :  I understand that if any problems occur once I am at home I am to contact my physician and I understand and acknowledge receipt of the instructions indicated above.                                                                                                                                            Physician's or R.N.'s Signature                                                                  Date/Time  Patient or Sales promotion account executive

## 2023-05-13 NOTE — Consults (Signed)
Session ID: [id number]  Language: Spanish  Interpreter ID: #[id number]  Interpreter Name: [facility]

## 2023-05-13 NOTE — Plan of Care (Signed)
Problem: Discharge Planning  Goal: Discharge to home or other facility with appropriate resources  Outcome: Progressing     Problem: Safety - Adult  Goal: Free from fall injury  Outcome: Progressing     Problem: Chronic Conditions and Co-morbidities  Goal: Patient's chronic conditions and co-morbidity symptoms are monitored and maintained or improved  Outcome: Progressing     Problem: Skin/Tissue Integrity  Goal: Absence of new skin breakdown  Description: 1.  Monitor for areas of redness and/or skin breakdown  2.  Assess vascular access sites hourly  3.  Every 4-6 hours minimum:  Change oxygen saturation probe site  4.  Every 4-6 hours:  If on nasal continuous positive airway pressure, respiratory therapy assess nares and determine need for appliance change or resting period.  Outcome: Progressing

## 2023-05-13 NOTE — Anesthesia Post-Procedure Evaluation (Signed)
Department of Anesthesiology  Postprocedure Note    Patient: Melinda [name]  MRN: [id number]  Birthdate: December 07, 1980  Date of evaluation: 05/13/2023    Procedure Summary       Date: 05/13/23 Room / Location: [facility] ENDO 03 / [facility] ENDOSCOPY    Anesthesia Start: 1409 Anesthesia Stop: 1424    Procedures:       ESOPHAGOGASTRODUODENOSCOPY (Upper GI Region)      ESOPHAGOGASTRODUODENOSCOPY BIOPSY (Upper GI Region) Diagnosis:       Abdominal pain, unspecified abdominal location      (Abdominal pain, unspecified abdominal location [R10.9])    Surgeons: Leta Speller, MD Responsible Provider: Riesa Pope, MD    Anesthesia Type: MAC ASA Status: 2            Anesthesia Type: No value filed.    Aldrete Phase I:      Aldrete Phase II: Aldrete Score: 8    Anesthesia Post Evaluation    Patient location during evaluation: PACU  Patient participation: complete - patient participated  Level of consciousness: awake  Pain score: 0  Airway patency: patent  Nausea & Vomiting: no nausea and no vomiting  Cardiovascular status: blood pressure returned to baseline  Respiratory status: acceptable  Hydration status: euvolemic  Multimodal analgesia pain management approach  Pain management: adequate    No notable events documented.

## 2023-05-13 NOTE — Op Note (Signed)
 [facility]  [place] [facility], [place]  [phone]      May 13, 2023    Esophagogastroduodenoscopy (EGD) Procedure Note  Maryrose Colvin [facility]  DOB: [birth year]  [name] Medical Record Number: [id number]      Indications:   Abdominal pain  Referring Physician:  Orlean Bradford, APRN - NP  Anesthesia/Sedation:  Conscious sedation/deep sedation/monitored anesthesia -- see notes.  Endoscopist:  Dr. Leta Speller  Complications:  None  Estimated Blood Loss:  None    Permit:  The indications, risks, benefits and alternatives were reviewed with the patient or their decision maker who was provided an opportunity to ask questions and all questions were answered.  The specific risks of esophagogastroduodenoscopy with conscious sedation were reviewed, including but not limited to anesthetic complication, bleeding, adverse drug reaction, missed lesion, infection, IV site reactions, and intestinal perforation which would lead to the need for surgical repair.  Alternatives to EGD including radiographic imaging, observation without testing, or laboratory testing were reviewed as well as the limitations of those alternatives discussed.  After considering the options and having all their questions answered, the patient or their decision maker provided both verbal and written consent to proceed.       Procedure in Detail:  After obtaining informed consent, positioning of the patient in the left lateral decubitus position, and conduction of a pre-procedure pause or "time out" the endoscope was introduced into the mouth and advanced to the duodenum.  A careful inspection was made, and findings or interventions are described below.    Findings:   Esophagus:normal  Stomach: normal   Duodenum/jejunum: normal  Cold forceps biopsies of antrum and duodenum taken for histology.    Specimens: See above    Impression: Normal EGD.            Recommendations:  -Await pathology.  -As her reported pain continues and CT, repeat EGD normal, and she is already status post cholecystectomy; we could have her try amitryptiline for functional abdominal pain, initial dose would be 10mg  QHS so long as there are no contraindications.  -OK for regular diet.    Thank you for entrusting me with this patient's care.  Please do not hesitate to contact me with any questions or if I can be of assistance with any of your other patients' GI needs.  Signed By: [name], MD                        May 13, 2023     Surgical assistant none.  Implants none unless specified.

## 2023-05-14 LAB — COMPREHENSIVE METABOLIC PANEL
ALT: 47 U/L (ref 12–78)
AST: 44 U/L — ABNORMAL HIGH (ref 15–37)
Albumin/Globulin Ratio: 0.7 — ABNORMAL LOW (ref 1.1–2.2)
Albumin: 2.3 g/dL — ABNORMAL LOW (ref 3.5–5.0)
Alk Phosphatase: 90 U/L (ref 45–117)
Anion Gap: 4 mmol/L — ABNORMAL LOW (ref 5–15)
BUN/Creatinine Ratio: 12 (ref 12–20)
BUN: 14 MG/DL (ref 6–20)
CO2: 27 mmol/L (ref 21–32)
Calcium: 8.4 MG/DL — ABNORMAL LOW (ref 8.5–10.1)
Chloride: 104 mmol/L (ref 97–108)
Creatinine: 1.2 MG/DL — ABNORMAL HIGH (ref 0.55–1.02)
Est, Glom Filt Rate: 58 mL/min/{1.73_m2} — ABNORMAL LOW (ref >60)
Globulin: 3.3 g/dL (ref 2.0–4.0)
Glucose: 118 mg/dL — ABNORMAL HIGH (ref 65–100)
Potassium: 3.5 mmol/L (ref 3.5–5.1)
Sodium: 135 mmol/L — ABNORMAL LOW (ref 136–145)
Total Bilirubin: 0.5 MG/DL (ref 0.2–1.0)
Total Protein: 5.6 g/dL — ABNORMAL LOW (ref 6.4–8.2)

## 2023-05-14 LAB — POCT GLUCOSE
POC Glucose: 215 mg/dL — ABNORMAL HIGH (ref 65–117)
POC Glucose: 132 mg/dL — ABNORMAL HIGH (ref 65–117)

## 2023-05-14 LAB — MAGNESIUM: Magnesium: 1.8 mg/dL (ref 1.6–2.4)

## 2023-05-14 MED ORDER — INSULIN NPH ISOPHANE & REGULAR (70-30) 100 UNIT/ML SC SUSP
Freq: Two times a day (BID) | SUBCUTANEOUS | 1 refills | Status: AC
Start: 2023-05-14 — End: 2023-06-17

## 2023-05-14 MED ORDER — AMITRIPTYLINE HCL 10 MG PO TABS
10 | ORAL_TABLET | Freq: Every evening | ORAL | 1 refills | 30.00 days | Status: DC
Start: 2023-05-14 — End: 2023-09-14

## 2023-05-14 MED FILL — ONDANSETRON 4 MG PO TBDP: 4 MG | ORAL | Qty: 1

## 2023-05-14 MED FILL — OXYCODONE HCL 5 MG PO TABS: 5 MG | ORAL | Qty: 2

## 2023-05-14 MED FILL — AMLODIPINE BESYLATE 5 MG PO TABS: 5 MG | ORAL | Qty: 2

## 2023-05-14 MED FILL — METOCLOPRAMIDE HCL 5 MG/ML IJ SOLN: 5 MG/ML | INTRAMUSCULAR | Qty: 2

## 2023-05-14 MED FILL — HYDROMORPHONE HCL 1 MG/ML IJ SOLN: 1 MG/ML | INTRAMUSCULAR | Qty: 1

## 2023-05-14 MED FILL — AMITRIPTYLINE HCL 10 MG PO TABS: 10 MG | ORAL | Qty: 1

## 2023-05-14 MED FILL — FAMOTIDINE (PF) 20 MG/2ML IV SOLN: 20 MG/2ML | INTRAVENOUS | Qty: 2

## 2023-05-14 MED FILL — POLYETHYLENE GLYCOL 3350 17 G PO PACK: 17 g | ORAL | Qty: 1

## 2023-05-14 MED FILL — ACETAMINOPHEN EXTRA STRENGTH 500 MG PO TABS: 500 MG | ORAL | Qty: 2

## 2023-05-14 MED FILL — METOPROLOL SUCCINATE ER 50 MG PO TB24: 50 MG | ORAL | Qty: 1

## 2023-05-14 NOTE — Discharge Summary (Signed)
[facility]  [address] [place], [place], [place] [address]  Tel: [phone]    Hospital Medicine Discharge Summary    Patient ID:    Melinda [name]  Age:              42 y.o.    DOB: 08-Jun-1981  MRN:             [id number]     PCP: Orlean Bradford, APRN - NP     Date of Admission: 05/08/2023    Date of Discharge:  05/14/2023    Discharge Diagnoses:  Principal Problem:    Intractable abdominal pain     Diabetes mellitus type 2 with complications (HCC)    HTN (hypertension), benign    CKD stage 3 due to type 2 diabetes mellitus (HCC)    Reason for admission:    Hyperglycemia [R73.9]  Elevated serum creatinine [R79.89]  Intractable abdominal pain [R10.9]  Intractable nausea and vomiting [R11.2]  Nausea and vomiting, unspecified vomiting type [R11.2]    Diagnostic testing:    Laboratory data reviewed and independently interpreted:    Recent Labs     05/12/23  0217 05/13/23  1136   WBC 7.6 9.0   HGB 9.4* 10.9*   HCT 27.0* 32.1*   RBC 3.22* 3.81   MCV 83.9 84.3   MCH 29.2 28.6   PLT 353 431*     No results found for: "LACTA"  Recent Labs     05/12/23  0217 05/13/23  1136 05/14/23  0515   NA 138 137 135*   K 3.9 3.6 3.5   CL 108 105 104   CO2 25 26 27    GLUCOSE 74 106* 118*   BUN 16 14 14    CREATININE 1.14* 1.13* 1.20*   CALCIUM 8.1* 8.6 8.4*   MG 1.7 1.8 1.8   BILITOT 0.4 0.7 0.5   ALKPHOS 81 93 90   AST 38* 45* 44*   ALT 28 44 47     No components found for: "GLUCOSEPOC"  Lab Results   Component Value Date/Time    CHOL 341 09/16/2022 10:30 AM    TRIG 1,573 09/16/2022 10:30 AM    HDL 33 09/16/2022 10:30 AM       Imaging data reviewed:    CT ABDOMEN PELVIS W IV CONTRAST Additional Contrast? None    Result Date: 05/08/2023  No acute abnormality. Status post cholecystectomy. Electronically signed by [facility] Course:     Melinda [name] is a 42 y.o. admitted to [facility] and treated for the following:    Intractable Abdominal pain  POA: admitted with associated nausea and vomiting. CT imaging showed fecal stasis. She is sp cholecystectomy. Prior EGD, colonoscopy and gastric emptying scan neg for acute changes. This could be from DM gastroparesis. Seen by GI who did an EGD that was normal. Her symptoms are better although with some residual discomfort. Started on Elavil. Continue Pantoprazole and Reglan. Discharged home in stable condition.       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 Blood glucose stable. Continue adjusted home Novolin 70/30 and can be up-titrated as her clinical condition improves.       HTN (hypertension), benign POA: BP variable. Continue Amlodipine, Metoprolol and Lisinopril     Urinary Tract Infection POA: cultures isolated Strep beta hemolytic group B. She has minimal symptoms. Afebrile       CKD stage 3 due to type  2 diabetes mellitus POA: SCR stable.      Discharge Exam:  BP 131/84   Pulse 82   Temp 98.8 F (37.1 C) (Oral)   Resp 16   Ht 1.499 m (4\' 11" )   Wt 60 kg (132 lb 4.4 oz)   LMP 05/07/2023   SpO2 94%   BMI 26.72 kg/m      Patient has been seen and examined.    General: well looking and in no acute distress  Pulm: clear breath sounds without wheezes  Card: no murmurs, normal S1, S2 without thrills, bruits   Abd:    soft, non-tender, normoactive bowel sounds  Skin: no rashes and skin turgor is good  Neuro: awake, alert and has a non focal     Activity: Activity as tolerated    Diet: diabetic diet    Current Discharge Medication List        START taking these medications    Details   amitriptyline (ELAVIL) 10 MG tablet Take 1 tablet by mouth nightly  Qty: 30 tablet, Refills: 1           CONTINUE these medications which have CHANGED    Details   insulin 70-30 (NOVOLIN 70/30) (70-30) 100 UNIT per ML injection vial Inject 15 Units into the skin 2 times daily  Qty: 9 mL, Refills: 1           CONTINUE these medications which have NOT CHANGED    Details   ondansetron (ZOFRAN-ODT) 4 MG  disintegrating tablet Take 1 tablet by mouth every 8 hours as needed for Nausea or Vomiting  Qty: 30 tablet, Refills: 0      lisinopril (PRINIVIL;ZESTRIL) 40 MG tablet Take 1 tablet by mouth daily  Qty: 30 tablet, Refills: 2      metoprolol succinate (TOPROL XL) 50 MG extended release tablet Take 1 tablet by mouth daily  Qty: 30 tablet, Refills: 2      amLODIPine (NORVASC) 10 MG tablet Take 1 tablet by mouth daily  Qty: 30 tablet, Refills: 2      metoclopramide (REGLAN) 5 MG tablet Take 1 tablet by mouth 3 times daily (before meals)  Qty: 90 tablet, Refills: 1      pantoprazole (PROTONIX) 40 MG tablet Take 1 tablet by mouth 2 times daily (before meals)  Qty: 60 tablet, Refills: 1      naloxone (NARCAN) 4 MG/0.1ML LIQD nasal spray 1 spray by Nasal route as needed for Opioid Reversal  Qty: 2 each, Refills: 0           STOP taking these medications       omeprazole (PRILOSEC) 10 MG delayed release capsule Comments:   Reason for Stopping:         polyethylene glycol (GLYCOLAX) 17 g packet Comments:   Reason for Stopping:         sennosides-docusate sodium (SENOKOT-S) 8.6-50 MG tablet Comments:   Reason for Stopping:               Orlean Bradford, APRN - NP  [address]  [place] [address]  [phone]    Schedule an appointment as soon as possible for a visit  to schedule a regular follow up after discharge      Follow-up tests or labs: As noted above    Discharge Condition: Stable    Disposition: home    Time taken to co-ordinate and arrange discharge:  more than 30 minutes.    Signed:  Molly Maduro  Lorette Ang, MD       05/14/2023   9:50 AM

## 2023-05-14 NOTE — Plan of Care (Signed)
Problem: Discharge Planning  Goal: Discharge to home or other facility with appropriate resources  Outcome: Progressing     Problem: Pain  Goal: Verbalizes/displays adequate comfort level or baseline comfort level  Outcome: Progressing     Problem: Safety - Adult  Goal: Free from fall injury  Outcome: Progressing     Problem: Chronic Conditions and Co-morbidities  Goal: Patient's chronic conditions and co-morbidity symptoms are monitored and maintained or improved  Outcome: Progressing     Problem: Skin/Tissue Integrity  Goal: Absence of new skin breakdown  Description: 1.  Monitor for areas of redness and/or skin breakdown  2.  Assess vascular access sites hourly  3.  Every 4-6 hours minimum:  Change oxygen saturation probe site  4.  Every 4-6 hours:  If on nasal continuous positive airway pressure, respiratory therapy assess nares and determine need for appliance change or resting period.  Outcome: Progressing

## 2023-05-14 NOTE — Consults (Signed)
Session ID: [id number]  Language: Spanish  Interpreter ID: #[id number]  Interpreter Name: Sharia Reeve

## 2023-05-14 NOTE — Care Coordination-Inpatient (Signed)
CM Note:  Pt to d/c home today transported by family.  No CM needs.  She is to follow up OP with her PCP.    Shella Maxim, RN  05/14/2023  9:37 AM

## 2023-05-15 NOTE — ED Provider Notes (Signed)
[facility]    Time of Arrival:   05/15/23 0043        Final diagnoses:   [R10.9, G89.29] Chronic abdominal pain (Primary)       Medical Decision Making:      Differential/Questionable Diagnosis:       Chronic abdominal pain  Gastritis  PUD  GERD                                               Supplemental Historians Include; : patient     ED Course:     Patient's prior records were reviewed.  8 CT scans this year.  Most recently had a CT scan on May 08, 2023 that showed constipation otherwise no significant findings.  Reviewed the gastric emptying study from Feb 14, 2023.    Reviewed most recent discharge summary from yesterday.    Patient overall displays poor understanding of her diagnoses and suspected cause of her symptoms.  She has been admitted several times before at a previous hospital.    Today she is hemodynamically stable.  She appears uncomfortable on initial evaluation.  Her labs show normal BMP and CBC.  LFTs show very mild elevations in the AST and ALT but no obstructive pattern and she is already had a cholecystectomy.  Negative lipase and UA is uninfected.      KUB on my read does not appear to redemonstrate any significant constipation.    She received several rounds of medications including droperidol and 1 dose of morphine 2 mg however given her history of constipation was trying to avoid the use of narcotics.  The patient was still reporting pain on final reevaluation however clinically seems to have improved was able to walk around emergency department without any signs of discomfort or distress and her abdominal exams are relatively benign other than some mild epigastric tenderness.    I do not think another CT scan today would be beneficial to her.  I have low suspicion for acute surgical emergency.  I do not see an indication for admission at this point recommend pain control will probably need to see pain management specialist at some point and  follow-up with GI.  Patient eventually asking for discharge home and comfortable with plan for discharge.  Discussed case with Dr. Antoine Poche who agrees with my assessment and plan.              As of 05/15/2023, 2:15 AM  The differential diagnosis and / or critical care lists were considered including infections, sepsis, severe sepsis, and septic shock and found unlikely unless otherwise documented in the final clinical impression or diagnosis list.             Documentation/Prior Results Review : Old medical records:  Reviewed prior ED notes, Nursing notes    Rhythm interpretations from Monitor by me; : N/A    Image interpretations by me -  : As documented in ED Course    ABDOMEN/KUB   Final Result   Negative exam.      Dictated ByDeveron Furlong on 05/15/2023 4:36 AM      As attending radiologist, I have assessed the study images, and dictated or reviewed/edited the final report as needed.      Signed By: Genia Hotter, MD on 05/15/2023 5:10 AM  EKG 12-LEAD   Final Result          .     Social determinants : no primary care provider     Discussion of Management with other Physicians, QHP or Appropriate Source; : None    .    Disposition:  Home    Discharge Medication List as of 05/15/2023  5:03 AM        START taking these medications    Details   dicyclomine (BENTYL) 20 mg PO TABS Take 1 Tab by Mouth 4 Times Daily As Needed (abdominal pain)., Disp-30 Tab, R-0, Print      famotidine (PEPCID) 20 mg PO TABS Take 1 Tab by Mouth Every 12 hours., Disp-28 Tab, R-0, Print      metoCLOPramide (REGLAN) 10 mg PO TABS Take 1 Tab by Mouth 4 Times Daily., Disp-20 Tab, R-0, Print           Chief Complaint   Patient presents with   . ABDOMINAL PAIN       42 year old female especially speaking evaluated the assistance of Kennon Rounds interpreter also with daughter present who was doing some interpreting, history of chronic abdominal pain, cholecystectomy in April, presents ED with chief complaint of epigastric pain.  Patient's  epigastric pain is similar to previous presentations, verified by daughter.  Started getting worse over the past day or so.  Cannot eat or drink due to pain.  Reports some vomiting and nausea.  No diarrhea.  Does not seem to be aware of any of her prior diagnostics that she has had or diagnoses.  Reportedly does take her medications at home her daughter states that she gives her the medicines.                Review of Systems:  Constitutional:  Negative for fever and chills.   Respiratory:  Negative for cough.    Gastrointestinal:  Positive for nausea, vomiting and abdominal pain. Negative for diarrhea.   Skin:  Negative for rash.       Physical Exam  Vitals and nursing note reviewed.   Constitutional:       General: She is not in acute distress.     Appearance: Normal appearance. She is not toxic-appearing.   HENT:      Head: Normocephalic and atraumatic.      Mouth/Throat:      Mouth: Mucous membranes are moist.   Cardiovascular:      Rate and Rhythm: Normal rate.   Pulmonary:      Effort: Pulmonary effort is normal. No respiratory distress.      Breath sounds: Normal breath sounds. No wheezing, rhonchi or rales.   Abdominal:      Palpations: Abdomen is soft.      Tenderness: There is abdominal tenderness in the epigastric area. There is no guarding or rebound.   Skin:     General: Skin is warm and dry.      Findings: No rash.   Neurological:      Mental Status: She is alert.      Gait: Gait normal.   Psychiatric:         Mood and Affect: Mood normal.         No past medical history on file.  No past surgical history on file.  No family history on file.  Social History     Occupational History   . Not on file   Tobacco Use   . Smoking status: Not on  file   . Smokeless tobacco: Not on file   Substance and Sexual Activity   . Alcohol use: Not on file   . Drug use: Not on file   . Sexual activity: Not on file     Outpatient Medications Marked as Taking for the 05/15/23 encounter [facility] Encounter)   Medication Sig  Dispense Refill   . dicyclomine (BENTYL) 20 mg PO TABS Take 1 Tab by Mouth 4 Times Daily As Needed (abdominal pain). 30 Tab 0   . famotidine (PEPCID) 20 mg PO TABS Take 1 Tab by Mouth Every 12 hours. 28 Tab 0   . metoCLOPramide (REGLAN) 10 mg PO TABS Take 1 Tab by Mouth 4 Times Daily. 20 Tab 0     No Known Allergies    Vital Signs:  Patient Vitals for the past 72 hrs:   Temp Heart Rate Pulse Resp BP BP Mean SpO2   05/15/23 0450 98.4 F (36.9 C) -- 97 16 145/84 (!) 104 MM HG 100 %   05/15/23 0354 98.4 F (36.9 C) -- 103 16 168/102 (!) 129 MM HG 98 %   05/15/23 0046 99.2 F (37.3 C) 108 -- 18 (!) 185/115 (!) 138 MM HG 100 %       Diagnostics:  Labs:    Results for orders placed or performed during the hospital encounter of 05/15/23   BASIC METABOLIC PANEL   Result Value Ref Range    Potassium 3.6 3.5 - 5.5 mmol/L    Sodium 136 133 - 145 mmol/L    Chloride 98 98 - 110 mmol/L    Glucose 114 (H) 70 - 99 mg/dL    Calcium 8.9 8.4 - 91.4 mg/dL    BUN 13 6 - 22 mg/dL    Creatinine 1.2 0.5 - 1.2 mg/dL    CO2 28 20 - 32 mmol/L    eGFR 56.1 (L) >60.0 mL/min/1.73 sq.m.    Anion Gap 10.0 3.0 - 15.0 mmol/L   CBC WITH DIFFERENTIAL AUTO   Result Value Ref Range    WBC 8.9 4.0 - 11.0 K/uL    RBC 3.83 3.80 - 5.20 M/uL    HGB 11.2 (L) 11.7 - 15.5 g/dL    HCT 78.2 (L) 95.6 - 46.5 %    MCV 82 80 - 99 fL    MCH 29 26 - 34 pg    MCHC 36 31 - 36 g/dL    RDW 21.3 08.6 - 57.8 %    Platelet 438 140 - 440 K/uL    MPV 8.5 (L) 9.0 - 13.0 fL    Segmented Neutrophils (Auto) 70 40 - 75 %    Lymphocytes (Auto) 21 20 - 45 %    Monocytes (Auto) 8 3 - 12 %    Eosinophils (Auto) 1 0 - 6 %    Basophils (Auto) 1 0 - 2 %    Absolute Neutrophils (Auto) 6.2 1.8 - 7.7 K/uL    Absolute Lymphocytes (Auto) 1.8 1.0 - 4.8 K/uL    Absolute Monocytes (Auto) 0.7 0.1 - 1.0 K/uL    Absolute Eosinophils (Auto) 0.1 0.0 - 0.5 K/uL    Absolute Basophils (Auto) 0.1 0.0 - 0.2 K/uL   HEPATIC FUNCTION PANEL   Result Value Ref Range    Albumin 3.3 (L) 3.5 - 5.0 g/dL    Total  Protein 6.5 6.4 - 8.3 g/dL    Globulin 3.2 2.0 - 4.0 g/dL    A/G Ratio 1.0 (L) 1.1 - 2.6  ratio    Bilirubin Total <0.1 (L) 0.2 - 1.2 mg/dL    Bilirubin Direct <0.9 0.0 - 0.3 mg/dL    SGOT (AST) 66 (H) 10 - 37 U/L    Alkaline Phosphatase 114 25 - 115 U/L    SGPT (ALT) 61 (H) 5 - 40 U/L   LIPASE   Result Value Ref Range    Lipase <20 7 - 60 U/L   Urinalysis w Micro Reflex Culture    Specimen: Clean Catch Urine   Result Value Ref Range    Source Urine      Urine Color Yellow Colorless, Pale Yellow, Light Yellow, Yellow, Dark Yellow, Straw    Urine Clarity Clear Clear, Slightly Cloudy    Urine pH 6.0 5.0 - 8.0 pH    Urine Protein Screen >=1000* (A) Negative, Trace mg/dL    Urine Glucose 811* (A) Negative mg/dL    Urine Ketones Negative Negative mg/dL    Urine Occult Blood Small (A) Negative    Urine Specific Gravity 1.013 1.005 - 1.030    Urine Nitrite Negative Negative    Urine Leukocyte Esterase Negative Negative    Urine Bilirubin Negative Negative    Urine Urobilinogen 0.2 <2.0 mg/dL mg/dL    Urine RBC 0-2 Negative, 0-2 /hpf    Urine WBC 3-5 0 - 5 /hpf    Urine Bacteria Negative Negative    Squamous Epithelial Cells 3-5 (A) None, 0-2 /hpf    Hyaline Cast 3-5 (A) 0 - 2 /lpf   PREGNANCY URINE (Lab)   Result Value Ref Range    PREGNANCY URINE Negative Negative     ECG:  Results for orders placed or performed during the hospital encounter of 05/15/23   EKG 12-LEAD   Result Value Ref Range Status    Heart Rate 103 bpm Final    RR Interval 584 ms Final    Atrial Rate 103 ms Final    P-R Interval 171 ms Final    P Duration 138 ms Final    P Horizontal Axis -2 deg Final    P Front Axis 57 deg Final    Q Onset 504 ms Final    QRSD Interval 86 ms Final    QT Interval 387 ms Final    QTcB 506 ms Final    QTcF 463 ms Final    QRS Horizontal Axis 233 deg Final    QRS Axis 269 deg Final    I-40 Front Axis 0 deg Final    t-40 Horizontal Axis 208 deg Final    T-40 Front Axis 228 deg Final    T Horizontal Axis 17 deg Final    T Wave  Axis 42 deg Final    S-T Horizontal Axis 62 deg Final    S-T Front Axis 91 deg Final    Impression - ABNORMAL ECG -  Final    Impression ST-Sinus tachycardia-rate>99  Final    Impression   Final     PLAE-Probable left atrial enlargement-P >97mS, <-0.27mV V1    Impression   Final     ETRSR1-RSR' in V1 or V2, right VCD or RVH-QRS area positive & R' V1/V2    Impression IMIO-Inferior infarct, old-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, II III aVF  Final    Impression NOECG-No ECG for comparison-  Final            Medications ordered/given in the ED  Medications   oxyCODONE-acetaminophen (Percocet) 5-325 mg tablet 1 Tab (1 Tab Oral Refused 05/15/23 0450)  droPERidol (Inapsine) injection 1.25 mg (1.25 mg Intravenous Given 05/15/23 0304)   diphenhydrAMINE (BenadryL) injection 25 mg (25 mg Intravenous Given 05/15/23 0301)   dicyclomine (BentyL) injection 20 mg (20 mg Intramuscular Given 05/15/23 0301)   famotidine (PF) (Pepcid) injection 20 mg (20 mg Intravenous Given 05/15/23 0305)   sodium chloride (normal saline) 0.9% infusion (0 mL Intravenous End Infusion 05/15/23 0359)   droPERidol (Inapsine) injection 1.25 mg (1.25 mg Intravenous Given 05/15/23 0353)   diphenhydrAMINE (BenadryL) injection 25 mg (25 mg Intravenous Given 05/15/23 0352)   morphine injection 2 mg (2 mg IV Push Given 05/15/23 0353)

## 2023-05-28 ENCOUNTER — Emergency Department: Admit: 2023-05-28 | Payer: MEDICAID | Primary: Adult Health

## 2023-05-28 ENCOUNTER — Inpatient Hospital Stay
Admission: EM | Admit: 2023-05-28 | Discharge: 2023-05-29 | Disposition: A | Discharge status: Home / Self Care | Payer: MEDICAID | Admitting: Student in an Organized Health Care Education/Training Program

## 2023-05-28 DIAGNOSIS — R1084 Generalized abdominal pain: Secondary | ICD-10-CM

## 2023-05-28 DIAGNOSIS — R112 Nausea with vomiting, unspecified: Secondary | ICD-10-CM

## 2023-05-28 LAB — CBC WITH AUTO DIFFERENTIAL
Basophils %: 1 % (ref 0–1)
Basophils Absolute: 0.1 10*3/uL (ref 0.0–0.1)
Eosinophils %: 2 % (ref 0–7)
Eosinophils Absolute: 0.2 10*3/uL (ref 0.0–0.4)
Hematocrit: 34.2 % — ABNORMAL LOW (ref 35.0–47.0)
Hemoglobin: 12.2 g/dL (ref 11.5–16.0)
Immature Granulocytes %: 0 % (ref 0.0–0.5)
Immature Granulocytes Absolute: 0 10*3/uL (ref 0.00–0.04)
Lymphocytes %: 18 % (ref 12–49)
Lymphocytes Absolute: 1.9 10*3/uL (ref 0.8–3.5)
MCH: 29.2 PG (ref 26.0–34.0)
MCHC: 35.7 g/dL (ref 30.0–36.5)
MCV: 81.8 FL (ref 80.0–99.0)
MPV: 8.4 FL — ABNORMAL LOW (ref 8.9–12.9)
Monocytes %: 4 % — ABNORMAL LOW (ref 5–13)
Monocytes Absolute: 0.5 10*3/uL (ref 0.0–1.0)
Neutrophils %: 75 % (ref 32–75)
Neutrophils Absolute: 7.8 10*3/uL (ref 1.8–8.0)
Nucleated RBCs: 0 PER 100 WBC
Platelets: 523 10*3/uL — ABNORMAL HIGH (ref 150–400)
RBC: 4.18 M/uL (ref 3.80–5.20)
RDW: 13.2 % (ref 11.5–14.5)
WBC: 10.3 10*3/uL (ref 3.6–11.0)
nRBC: 0 10*3/uL (ref 0.00–0.01)

## 2023-05-28 LAB — COMPREHENSIVE METABOLIC PANEL
ALT: 52 U/L (ref 12–78)
AST: 25 U/L (ref 15–37)
Albumin/Globulin Ratio: 0.6 — ABNORMAL LOW (ref 1.1–2.2)
Albumin: 2.8 g/dL — ABNORMAL LOW (ref 3.5–5.0)
Alk Phosphatase: 129 U/L — ABNORMAL HIGH (ref 45–117)
Anion Gap: 6 mmol/L (ref 5–15)
BUN/Creatinine Ratio: 19 (ref 12–20)
BUN: 29 MG/DL — ABNORMAL HIGH (ref 6–20)
CO2: 25 mmol/L (ref 21–32)
Calcium: 9.7 MG/DL (ref 8.5–10.1)
Chloride: 108 mmol/L (ref 97–108)
Creatinine: 1.52 MG/DL — ABNORMAL HIGH (ref 0.55–1.02)
Est, Glom Filt Rate: 44 mL/min/{1.73_m2} — ABNORMAL LOW (ref >60)
Globulin: 4.7 g/dL — ABNORMAL HIGH (ref 2.0–4.0)
Glucose: 95 mg/dL (ref 65–100)
Potassium: 3.5 mmol/L (ref 3.5–5.1)
Sodium: 139 mmol/L (ref 136–145)
Total Bilirubin: 0.3 MG/DL (ref 0.2–1.0)
Total Protein: 7.5 g/dL (ref 6.4–8.2)

## 2023-05-28 LAB — URINALYSIS WITH MICROSCOPIC
Bilirubin, Urine: NEGATIVE
Glucose, Ur: 100 mg/dL — AB
Ketones, Urine: NEGATIVE mg/dL
Nitrite, Urine: NEGATIVE
Protein, UA: 300 mg/dL — AB
Specific Gravity, UA: 1.028 (ref 1.003–1.030)
Urobilinogen, Urine: 0.2 EU/dL (ref 0.2–1.0)
pH, Urine: 7 (ref 5.0–8.0)

## 2023-05-28 LAB — COVID-19 & INFLUENZA COMBO
Rapid Influenza A By PCR: NOT DETECTED
Rapid Influenza B By PCR: NOT DETECTED
SARS-CoV-2, PCR: NOT DETECTED

## 2023-05-28 LAB — POC PREGNANCY UR-QUAL: Preg Test, Ur: NEGATIVE

## 2023-05-28 LAB — URINE CULTURE HOLD SAMPLE

## 2023-05-28 LAB — LIPASE: Lipase: 26 U/L (ref 13–75)

## 2023-05-28 MED ORDER — CEPHALEXIN 500 MG PO CAPS
500 | ORAL_CAPSULE | Freq: Three times a day (TID) | ORAL | 0 refills | Status: DC
Start: 2023-05-28 — End: 2023-05-29

## 2023-05-28 MED ORDER — SODIUM CHLORIDE 0.9 % IV BOLUS
0.9 | Freq: Once | INTRAVENOUS | Status: DC
Start: 2023-05-28 — End: 2023-05-28

## 2023-05-28 MED ORDER — HALOPERIDOL LACTATE 5 MG/ML IJ SOLN
5 | Freq: Once | INTRAMUSCULAR | Status: AC
Start: 2023-05-28 — End: 2023-05-28
  Administered 2023-05-28: 22:00:00 5 mg via INTRAVENOUS

## 2023-05-28 MED ORDER — HYDROMORPHONE HCL PF 1 MG/ML IJ SOLN
1 | INTRAMUSCULAR | Status: AC
Start: 2023-05-28 — End: 2023-05-28
  Administered 2023-05-28: 21:00:00 1 mg via INTRAVENOUS

## 2023-05-28 MED ORDER — IOPAMIDOL 76 % IV SOLN
76 | Freq: Once | INTRAVENOUS | Status: AC | PRN
Start: 2023-05-28 — End: 2023-05-28
  Administered 2023-05-28: 22:00:00 100 mL via INTRAVENOUS

## 2023-05-28 MED ORDER — ONDANSETRON HCL 4 MG/2ML IJ SOLN
4 | Freq: Once | INTRAMUSCULAR | Status: AC
Start: 2023-05-28 — End: 2023-05-28
  Administered 2023-05-28: 20:00:00 4 mg via INTRAVENOUS

## 2023-05-28 MED ORDER — ONDANSETRON HCL 4 MG/2ML IJ SOLN
4 | Freq: Once | INTRAMUSCULAR | Status: DC
Start: 2023-05-28 — End: 2023-05-28

## 2023-05-28 MED ORDER — DICYCLOMINE HCL 20 MG PO TABS
20 | ORAL | Status: AC
Start: 2023-05-28 — End: 2023-05-28
  Administered 2023-05-28: 20:00:00 20 mg via ORAL

## 2023-05-28 MED ORDER — KETOROLAC TROMETHAMINE 15 MG/ML IJ SOLN
15 | Freq: Once | INTRAMUSCULAR | Status: AC
Start: 2023-05-28 — End: 2023-05-28
  Administered 2023-05-28: 21:00:00 15 mg via INTRAVENOUS

## 2023-05-28 MED ORDER — ONDANSETRON 4 MG PO TBDP
4 | ORAL_TABLET | Freq: Three times a day (TID) | ORAL | 0 refills | Status: DC | PRN
Start: 2023-05-28 — End: 2023-05-29

## 2023-05-28 MED ORDER — SODIUM CHLORIDE 0.9 % IV BOLUS
0.9 | Freq: Once | INTRAVENOUS | Status: AC
Start: 2023-05-28 — End: 2023-05-28
  Administered 2023-05-28: 20:00:00 1000 mL via INTRAVENOUS

## 2023-05-28 MED FILL — DICYCLOMINE HCL 20 MG PO TABS: 20 MG | ORAL | Qty: 1

## 2023-05-28 MED FILL — SODIUM CHLORIDE 0.9 % IV SOLN: 0.9 % | INTRAVENOUS | Qty: 1000

## 2023-05-28 MED FILL — ISOVUE-370 76 % IV SOLN: 76 % | INTRAVENOUS | Qty: 100

## 2023-05-28 MED FILL — HALOPERIDOL LACTATE 5 MG/ML IJ SOLN: 5 MG/ML | INTRAMUSCULAR | Qty: 1

## 2023-05-28 MED FILL — HYDROMORPHONE HCL 1 MG/ML IJ SOLN: 1 MG/ML | INTRAMUSCULAR | Qty: 1

## 2023-05-28 MED FILL — KETOROLAC TROMETHAMINE 15 MG/ML IJ SOLN: 15 MG/ML | INTRAMUSCULAR | Qty: 1

## 2023-05-28 MED FILL — ONDANSETRON HCL 4 MG/2ML IJ SOLN: 4 MG/2ML | INTRAMUSCULAR | Qty: 2

## 2023-05-28 NOTE — ED Notes (Signed)
TRANSFER - OUT REPORT:    Verbal report given to Charisse on Acelin Campillo Toto-Ambros  being transferred to 502 for routine progression of patient care       Report consisted of patient's Situation, Background, Assessment and   Recommendations(SBAR).     Information from the following report(s) Nurse Handoff Report, ED Encounter Summary, ED SBAR, Intake/Output, MAR, Recent Results, Cardiac Rhythm ST/NSR, and Neuro Assessment was reviewed with the receiving nurse.    Kinder Fall Assessment:    Presents to emergency department  because of falls (Syncope, seizure, or loss of consciousness): No  Age > 70: No  Altered Mental Status, Intoxication with alcohol or substance confusion (Disorientation, impaired judgment, poor safety awaremess, or inability to follow instructions): No  Impaired Mobility: Ambulates or transfers with assistive devices or assistance; Unable to ambulate or transer.: No  Nursing Judgement: No          Lines:   Peripheral IV 05/28/23 Right Antecubital (Active)        Opportunity for questions and clarification was provided.      Patient transported with:  [employer]

## 2023-05-28 NOTE — ED Notes (Signed)
Bedside and Verbal shift change report given to Ashlyn RN(oncoming nurse) by Alberteen Spindle  (offgoing nurse). Report included the following information Index, ED Encounter Summary, Adult Overview, Intake/Output, and Recent Results.

## 2023-05-28 NOTE — ED Provider Notes (Addendum)
5:00 PM  Change of shift. Care of patient taken over from Dr. Dionne Ano; H&P reviewed,  handoff complete.  Awaiting diagnostic studies.  Einar Grad, MD    Progress Note: She initially felt better after Haldol but now her symptoms have returned.  It looks like she will need to be admitted again.  Einar Grad, MD  8:13 PM    Perfect Serve Consult for Admission  8:13 PM    ED Room Number: ER06/06  Patient Name and age:  Melinda [name] 42 y.o.  female  Working Diagnosis: Abdominal pain/intractable vomiting    COVID-19 Suspicion: No  Sepsis present:  No  Reassessment needed: Yes  Code Status:  Full Code  Readmission: Yes  Isolation Requirements: no  Recommended Level of Care: med/surg  Department: [facility] - [phone]  42 year old with a history of hypertension, diabetes, hyperlipidemia, gastroparesis, chronic kidney disease.  She presents with abdominal pain, headache, sore throat, nausea, vomiting.  She has had sick contacts.  She has had Bentyl, Zofran, fluids, Toradol, Dilaudid, Haldol.  Her symptoms persist.  CT shows no acute process.  Mild creatinine bump.  Recent admission for similar symptoms.    Consult note: I reached out to Dr. Eulah Pont via [facility] for admission.  Einar Grad, MD  8:16 PM         Catalina Pizza, MD  05/28/23 2016

## 2023-05-28 NOTE — ED Triage Notes (Signed)
Pt arrives to ED with complaints of body aches, abdominal pain, headache, sore throat, nausea, and vomiting since last night. Pt states her daughters are sick with a cough and sore throat. Pt reports taking medication without relief. Pt denies diarrhea.

## 2023-05-28 NOTE — Discharge Instructions (Addendum)
HOSPITALIST DISCHARGE INSTRUCTIONS  NAME:  Merideth Luptak   DOB:  February 03, 1981   MRN:  [id number]     Date/Time:  05/29/2023 7:51 AM    ADMIT DATE: 05/28/2023     DISCHARGE DATE: 05/29/2023     DISCHARGE DIAGNOSIS:  UTI, gastroparesis causing nausea and vomiting    DISCHARGE INSTRUCTIONS:  Thank you for allowing [place] to participate in your care. Your discharging Hospitalist is Renelda Loma, MD. [name] were admitted for evaluation and treatment of the above. You were given fluids, antibiotics and treatment for your nausea. You are well enough to be discharged. Please take your meds as prescribed and follow up with your PCP.      MEDICATIONS:    It is important that you take the medication exactly as they are prescribed.   Keep your medication in the bottles provided by the pharmacist and keep a list of the medication names, dosages, and times to be taken in your wallet.   Do not take other medications without consulting your doctor.             If you experience any of the following symptoms then please call your primary care physician or return to the emergency room if you cannot get hold of your doctor:  Fever, chills, nausea, vomiting, diarrhea, change in mentation, falling, bleeding, shortness of breath    Follow Up:  Please call the below provider to arrange hospital follow up appointment      No follow-up provider specified.      Information obtained by :  I understand that if any problems occur once I am at home I am to contact my physician.    I understand and acknowledge receipt of the instructions indicated above.                                                                                                                                           Physician's or R.N.'s Signature                                                                  Date/Time  Patient or  Sales promotion account executive

## 2023-05-28 NOTE — ED Provider Notes (Signed)
[facility] EMERGENCY DEPT  EMERGENCY DEPARTMENT ENCOUNTER      Pt Name: Melinda [name]  MRN: [id number]  Birthdate 27-Jul-1981  Date of evaluation: 05/28/2023  Provider: Noreene Larsson, MD      HISTORY OF PRESENT ILLNESS      HPI  42 year old female with a past medical history of type 2 diabetes hypertension hyperlipidemia presenting to the emergency department due to abdominal pain nausea vomiting sore throat headache and bodyaches.  Symptoms started last night.  She says she has been vomiting all day today.  No diarrhea or blood in her stool.  No fevers or chills.  No urinary symptoms.  She herself denies cough, but states her daughters are sick with a sore throat cough.  History obtained through Spanish language interpreter      Nursing Notes were reviewed.    REVIEW OF SYSTEMS         Review of Systems  All systems reviewed were negative less otherwise document in the HPI      PAST MEDICAL HISTORY     Past Medical History:   Diagnosis Date    High blood pressure     High cholesterol     Type 2 diabetes mellitus (HCC)     Controled         SURGICAL HISTORY       Past Surgical History:   Procedure Laterality Date    CAPSULE ENDOSCOPY N/A 01/20/2023    ESOPHAGEAL CAPSULE ENDOSCOPY remove at 1624PM performed by Glyn Ade, MD at [facility] ENDOSCOPY    CHOLECYSTECTOMY, LAPAROSCOPIC N/A 02/03/2023    ROBOTIC LAPAROSCOPIC CHOLECYSTECTOMY with Indocyanine green performed by Verdis Frederickson, MD at [facility] MAIN OR    COLONOSCOPY N/A 01/19/2023    COLONOSCOPY DIAGNOSTIC performed by Glyn Ade, MD at [facility] ENDOSCOPY    OTHER SURGICAL HISTORY Left     Rentia attachment    TUBAL LIGATION Bilateral     UPPER GASTROINTESTINAL ENDOSCOPY N/A 01/17/2023    ESOPHAGOGASTRODUODENOSCOPY performed by Glyn Ade, MD at [facility] ENDOSCOPY    UPPER GASTROINTESTINAL ENDOSCOPY N/A 01/17/2023    ESOPHAGOGASTRODUODENOSCOPY BIOPSY performed by Glyn Ade, MD at [facility] ENDOSCOPY    UPPER GASTROINTESTINAL ENDOSCOPY N/A 01/18/2023     ESOPHAGOGASTRODUODENOSCOPY performed by Glyn Ade, MD at [facility] ENDOSCOPY    UPPER GASTROINTESTINAL ENDOSCOPY N/A 05/13/2023    ESOPHAGOGASTRODUODENOSCOPY performed by Leta Speller, MD at [facility] ENDOSCOPY    UPPER GASTROINTESTINAL ENDOSCOPY N/A 05/13/2023    ESOPHAGOGASTRODUODENOSCOPY BIOPSY performed by Leta Speller, MD at [facility] ENDOSCOPY    [place] ABSCESS DRAINAGE PERITONEAL  02/11/2023    [place] ABSCESS DRAINAGE PERITONEAL 02/11/2023 [facility] RAD [place]         CURRENT MEDICATIONS       Previous Medications    AMITRIPTYLINE (ELAVIL) 10 MG TABLET    Take 1 tablet by mouth nightly    AMLODIPINE (NORVASC) 10 MG TABLET    Take 1 tablet by mouth daily    INSULIN 70-30 (NOVOLIN 70/30) (70-30) 100 UNIT PER ML INJECTION VIAL    Inject 15 Units into the skin 2 times daily    LISINOPRIL (PRINIVIL;ZESTRIL) 40 MG TABLET    Take 1 tablet by mouth daily    METOCLOPRAMIDE (REGLAN) 5 MG TABLET    Take 1 tablet by mouth 3 times daily (before meals)    METOPROLOL SUCCINATE (TOPROL XL) 50 MG EXTENDED RELEASE TABLET    Take 1 tablet by mouth daily    NALOXONE (NARCAN) 4 MG/0.1ML LIQD NASAL  SPRAY    1 spray by Nasal route as needed for Opioid Reversal    ONDANSETRON (ZOFRAN-ODT) 4 MG DISINTEGRATING TABLET    Take 1 tablet by mouth every 8 hours as needed for Nausea or Vomiting    PANTOPRAZOLE (PROTONIX) 40 MG TABLET    Take 1 tablet by mouth 2 times daily (before meals)       ALLERGIES     Patient has no known allergies.    FAMILY HISTORY     History reviewed. No pertinent family history.       SOCIAL HISTORY       Social History     Socioeconomic History    Marital status: Married     Spouse name: None    Number of children: None    Years of education: None    Highest education level: None   Tobacco Use    Smoking status: Never    Smokeless tobacco: Never   Vaping Use    Vaping status: Never Used   Substance and Sexual Activity    Alcohol use: Never    Drug use: Never     Social Determinants of Health     Food Insecurity: No Food Insecurity  (05/09/2023)    Hunger Vital Sign     Worried About Running Out of Food in the Last Year: Never true     Ran Out of Food in the Last Year: Never true   Recent Concern: Food Insecurity - Food Insecurity Present (03/16/2023)    Hunger Vital Sign     Worried About Running Out of Food in the Last Year: Sometimes true     Ran Out of Food in the Last Year: Sometimes true   Transportation Needs: No Transportation Needs (05/09/2023)    PRAPARE - Therapist, art (Medical): No     Lack of Transportation (Non-Medical): No   Housing Stability: Low Risk  (05/09/2023)    Housing Stability Vital Sign     Unable to Pay for Housing in the Last Year: No     Number of Places Lived in the Last Year: 1     Unstable Housing in the Last Year: No         PHYSICAL EXAM       ED Triage Vitals [05/28/23 1554]   BP Systolic BP Percentile Diastolic BP Percentile Temp Temp Source Pulse Respirations SpO2   139/68 -- -- 98.3 F (36.8 C) Oral (!) 110 18 100 %      Height Weight - Scale         1.499 m (4\' 11" ) 59 kg (130 lb)             Body mass index is 26.26 kg/m.    Physical Exam  Constitutional:       Comments: Sitting upright no acute distress   Eyes:      General: No scleral icterus.     Extraocular Movements: Extraocular movements intact.   Cardiovascular:      Rate and Rhythm: Normal rate and regular rhythm.      Heart sounds: No murmur heard.  Pulmonary:      Effort: Pulmonary effort is normal. No respiratory distress.      Breath sounds: Normal breath sounds. No wheezing or rales.   Abdominal:      Comments: Soft.  Minimal generalized tenderness.  No rebound or guarding.  No distention.  Normal bowel sounds   Skin:  General: Skin is warm and dry.   Neurological:      Comments: Awake and alert.  GCS 15             EMERGENCY DEPARTMENT COURSE and DIFFERENTIAL DIAGNOSIS/MDM:   Vitals:    Vitals:    05/28/23 1554   BP: 139/68   Pulse: (!) 110   Resp: 18   Temp: 98.3 F (36.8 C)   TempSrc: Oral   SpO2: 100%    Weight: 59 kg (130 lb)   Height: 1.499 m (4\' 11" )         Medical Decision Making  Amount and/or Complexity of Data Reviewed  Labs: ordered.  Radiology: ordered.    Risk  Prescription drug management.      Patient presenting with the above chief complaint.  A bit tachycardic but otherwise vitals are stable.  Appears ill but nontoxic.  Not diaphoretic.  Abdomen benign with no focal area of pain.  Soft and nondistended without any evidence of a surgical abdomen.  I suspect a viral illness especially given viral symptoms and the patient's children.  Labs have been obtained.  She will be treated symptomatically and reassessed      REASSESSMENT      Grossly labs are reassuring.  Just a slight bump in the patient's creatinine compared to recent labs.  Patient still quite symptomatic and rocking back and forth in pain so I am getting a CT abdomen and pelvis.  Care will be signed out to Dr. Delane Ginger pending CT results and reassessment.    CONSULTS:  None    PROCEDURES:     Procedures          (Please note that portions of this note were completed with a voice recognition program.  Efforts were made to edit the dictations but occasionally words are mis-transcribed.)    Noreene Larsson, MD (electronically signed)  Emergency Attending Physician              Noreene Larsson, MD  05/28/23 [phone]

## 2023-05-28 NOTE — Consults (Signed)
Session ID: [id number]  Language: Spanish  Interpreter ID: #[id number]  Interpreter Name: Rulon Abide

## 2023-05-28 NOTE — H&P (Signed)
[facility]    Hospitalist Admission Note                                                                                                                                  NAME:  Melinda [name]   DOB:   Oct 25, 1980   MRN:  [id number]     PCP:  Orlean Bradford, APRN - NP     Date/Time of service:  05/28/2023 8:29 PM  To assist coordination of care and communication with nursing and staff, this note may be preliminary early in the day, but finalized by end of the day.        Subjective:     CHIEF COMPLAINT: nausea vomiting     HISTORY OF PRESENT ILLNESS:     Melinda [name] is a 42 y.o. female who presented to the Emergency Department complaining of nausea and vomiting. A recurrent issue.  Recently discharged for same, with negative workup other than constipation.  ER finds elevated pulse and blood pressure, and dehydration.  We will admit her for observation.    Past Medical History:   Diagnosis Date    High blood pressure     High cholesterol     Type 2 diabetes mellitus (HCC)     Controled        Past Surgical History:   Procedure Laterality Date    CAPSULE ENDOSCOPY N/A 01/20/2023    ESOPHAGEAL CAPSULE ENDOSCOPY remove at 1624PM performed by Glyn Ade, MD at [facility] ENDOSCOPY    CHOLECYSTECTOMY, LAPAROSCOPIC N/A 02/03/2023    ROBOTIC LAPAROSCOPIC CHOLECYSTECTOMY with Indocyanine green performed by Verdis Frederickson, MD at [facility] MAIN OR    COLONOSCOPY N/A 01/19/2023    COLONOSCOPY DIAGNOSTIC performed by Glyn Ade, MD at [facility] ENDOSCOPY    OTHER SURGICAL HISTORY Left     Rentia attachment    TUBAL LIGATION Bilateral     UPPER GASTROINTESTINAL ENDOSCOPY N/A 01/17/2023    ESOPHAGOGASTRODUODENOSCOPY performed by Glyn Ade, MD at [facility] ENDOSCOPY    UPPER GASTROINTESTINAL ENDOSCOPY N/A 01/17/2023    ESOPHAGOGASTRODUODENOSCOPY BIOPSY performed by Glyn Ade, MD at [facility] ENDOSCOPY    UPPER GASTROINTESTINAL ENDOSCOPY N/A 01/18/2023    ESOPHAGOGASTRODUODENOSCOPY performed by Glyn Ade, MD  at [facility] ENDOSCOPY    UPPER GASTROINTESTINAL ENDOSCOPY N/A 05/13/2023    ESOPHAGOGASTRODUODENOSCOPY performed by Leta Speller, MD at [facility] ENDOSCOPY    UPPER GASTROINTESTINAL ENDOSCOPY N/A 05/13/2023    ESOPHAGOGASTRODUODENOSCOPY BIOPSY performed by Leta Speller, MD at [facility] ENDOSCOPY    [place] ABSCESS DRAINAGE PERITONEAL  02/11/2023    [place] ABSCESS DRAINAGE PERITONEAL 02/11/2023 [facility] RAD [place]       Social History     Tobacco Use    Smoking status: Never    Smokeless tobacco: Never   Substance Use Topics    Alcohol use: Never        History reviewed. No pertinent family history.  No Known Allergies     Prior to Admission medications    Medication Sig Start Date End Date Taking? Authorizing Provider   ondansetron (ZOFRAN-ODT) 4 MG disintegrating tablet Take 1 tablet by mouth 3 times daily as needed for Nausea or Vomiting 05/28/23  Yes Catalina Pizza, MD   cephALEXin [facility]) 500 MG capsule Take 1 capsule by mouth 3 times daily for 5 days 05/28/23 06/02/23 Yes Catalina Pizza, MD   insulin 70-30 (NOVOLIN 70/30) (70-30) 100 UNIT per ML injection vial Inject 15 Units into the skin 2 times daily 05/14/23 06/13/23 Yes Melynda Keller, MD   lisinopril (PRINIVIL;ZESTRIL) 40 MG tablet Take 1 tablet by mouth daily 03/23/23  Yes Do, Twanna Hy, MD   amitriptyline (ELAVIL) 10 MG tablet Take 1 tablet by mouth nightly 05/14/23 06/13/23  Melynda Keller, MD   metoprolol succinate (TOPROL XL) 50 MG extended release tablet Take 1 tablet by mouth daily 03/23/23   Do, Khoi B, MD   amLODIPine (NORVASC) 10 MG tablet Take 1 tablet by mouth daily 03/23/23   Do, Khoi B, MD   metoclopramide (REGLAN) 5 MG tablet Take 1 tablet by mouth 3 times daily (before meals) 03/23/23   Do, Twanna Hy, MD   pantoprazole (PROTONIX) 40 MG tablet Take 1 tablet by mouth 2 times daily (before meals) 03/23/23   Do, Twanna Hy, MD   naloxone (NARCAN) 4 MG/0.1ML LIQD nasal spray 1 spray by Nasal route as needed for Opioid Reversal 01/20/23   Horald Chestnut, DO       Review of Systems:  (bold if  positive, if negative)    Gen:  Eyes:  ENT:  CVS:  Pulm:  GI:  GU:  MS:  Skin:  Psych:  Endo:  Hem:  Renal:  Neuro:        Objective:      VITALS:    Vital signs reviewed; most recent are:    Vitals:    05/28/23 1715 05/28/23 1745 05/28/23 1800 05/28/23 1830   BP:  (!) 192/105 (!) 188/99 (!) 166/110   Pulse:  100 (!) 102 (!) 104   Resp: 19 14 10 17    Temp:       TempSrc:       SpO2:  91% 100% 100%   Weight:       Height:          @lastspo2withflo @     No intake or output data in the 24 hours ending 05/28/23 2029     Exam:     Physical Exam:    Gen:  Well-developed, well-nourished, in no acute distress  HEENT:  Pink conjunctivae, PERRL, hearing intact to voice, moist mucous membranes  Neck:  Supple, without masses, thyroid non-tender  Resp:  No accessory muscle use, clear breath sounds without wheezes rales or rhonchi  Card:  No murmurs, normal S1, S2 without thrills, bruits or peripheral edema  Abd:  Soft, non-tender, non-distended, normoactive bowel sounds are present, no mass  Lymph:  No cervical or inguinal adenopathy  Musc:  No cyanosis or clubbing  Skin:  No rashes or ulcers, skin turgor is good  Neuro:  Cranial nerves are grossly intact, no focal motor weakness, follows commands appropriately  Psych:  Good insight, oriented to person, place and time, alert     Labs:    Recent Labs     05/28/23  1556   WBC 10.3   HGB 12.2   HCT 34.2*   PLT  523*     Recent Labs     05/28/23  1556   NA 139   K 3.5   CL 108   CO2 25   GLUCOSE 95   BUN 29*   CREATININE 1.52*   CALCIUM 9.7   AST 25   ALT 52     Lab Results   Component Value Date/Time    GLUCOSE 95 05/28/2023 03:56 PM    GLUCOSE 118 05/14/2023 05:15 AM    GLUCOSE 106 05/13/2023 11:36 AM    GLUCOSE 74 05/12/2023 02:17 AM    GLUCOSE 124 05/11/2023 02:00 AM     No results for input(s): "PH", "PCO2", "PO2", "HCO3", "FIO2" in the last 72 hours.  No results for input(s): "INR" in the last 72 hours.  Results       Procedure Component Value Units Date/Time    Culture, Urine  [[id number]]     Order Status: Sent Specimen: Urine, straight catheter     Urine Culture Hold Sample [[id number]] Collected: 05/28/23 1850    Order Status: Completed Specimen: Urine Updated: 05/28/23 1904     Specimen HOld       Urine on hold in Microbiology dept for 2 days.  If unpreserved urine is submitted, it cannot be used for addtional testing after 24 hours, recollection will be required.          COVID-19 & Influenza Combo [[id number]] Collected: 05/28/23 1556    Order Status: Completed Specimen: Nasopharyngeal Updated: 05/28/23 1632     SARS-CoV-2, PCR Not detected        Comment: Not Detected results do not preclude SARS-CoV-2 infection and should not be used as the sole basis for patient management decisions.Results must be combined with clinical observations, patient history, and epidemiological information.        Rapid Influenza A By PCR Not detected        Rapid Influenza B By PCR Not detected        Comment: Testing was performed using [employer]as Liat SARS-CoV-2 and Influenza A/B nucleic acid assay.  This test is a multiplex Real-Time Reverse Transcriptase Polymerase Chain Reaction (RT-PCR) based in vitro diagnostic test intended for the qualitative detection of nucleic acids from SARS-CoV-2, Influenza A, and Influenza B in nasopharyngeal and nasal swab specimens for use under the FDA's Emergency Use Authorization (EUA) only.     Fact sheet for Patients: SalonClasses.at  Fact sheet for Healthcare Providers: https://www.fda.fov/media/142191/download                 I have reviewed previous records       Assessment and Plan:      Nausea and vomiting / Abdominal pain / Gastroparesis / Constipation - POA and recurrent.  Full negative GI workup on recent admission.  For now give enema and PO laxatives.  Clear diet, advance as tolerated.  PPI by IV.  PO Reglan. Prn Zofran and compazine.  No narcotics.    Acute kidney injury / Dehydration / CKD stage 3 - POA due to poor PO intake.   Hydrate and monitor lytes.      Hypertension - Likely elevated as BB rebound.  Give IV metoprolol once. Resume metoprolol, amlodipine.  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  SSI per protocol.  Hold home 70/30 insulin until eating. Check A1c.    Hyperlipidemia - Not on meds    Anxiety and depression -  Elavil    Pyuria - contaminated sample. No leukocytosis or fever.  Repeat UA with sterile cath sample.  If that is abnormal, start ceftriaxone.     Telemetry reviewed:   normal sinus rhythm    Risk of deterioration: high      Total time spent with patient: 74 Minutes I personally reviewed chart, notes, data and current medications in the medical record.  I have personally examined and treated the patient at bedside during this period.                  Care Plan discussed with: Patient, Nursing Staff, and >50% of time spent in counseling and coordination of care    Discussed:  Care Plan and D/C Planning       ___________________________________________________    Attending Physician: Esperanza Sheets, MD

## 2023-05-28 NOTE — Consults (Signed)
Session ID: [id number]  Language: Spanish  Interpreter ID: [id number]  Interpreter Name: Jonetta Speak

## 2023-05-28 NOTE — ED Notes (Signed)
MD made aware of HTN

## 2023-05-28 NOTE — Consults (Signed)
Session ID: [id number]  Language: Spanish  Interpreter ID: #[id number]  Interpreter Name: Marisue Ivan

## 2023-05-29 LAB — POCT GLUCOSE
POC Glucose: 138 mg/dL — ABNORMAL HIGH (ref 65–117)
POC Glucose: 153 mg/dL — ABNORMAL HIGH (ref 65–117)
POC Glucose: 128 mg/dL — ABNORMAL HIGH (ref 65–117)

## 2023-05-29 LAB — ETHANOL: Ethanol Lvl: <10 mg/dL (ref <10)

## 2023-05-29 LAB — URINALYSIS
Bilirubin, Urine: NEGATIVE
Glucose, Ur: 250 mg/dL — AB
Ketones, Urine: NEGATIVE mg/dL
Nitrite, Urine: NEGATIVE
Protein, UA: >300 mg/dL — AB
Specific Gravity, UA: 1.01 (ref 1.003–1.030)
Urobilinogen, Urine: 0.2 EU/dL (ref 0.2–1.0)
pH, Urine: 6.5 (ref 5.0–8.0)

## 2023-05-29 LAB — CBC
Hematocrit: 28.8 % — ABNORMAL LOW (ref 35.0–47.0)
Hemoglobin: 9.7 g/dL — ABNORMAL LOW (ref 11.5–16.0)
MCH: 29.1 pg (ref 26.0–34.0)
MCHC: 33.7 g/dL (ref 30.0–36.5)
MCV: 86.5 FL (ref 80.0–99.0)
MPV: 8.8 FL — ABNORMAL LOW (ref 8.9–12.9)
Nucleated RBCs: 0 /100{WBCs}
Platelets: 402 10*3/uL — ABNORMAL HIGH (ref 150–400)
RBC: 3.33 M/uL — ABNORMAL LOW (ref 3.80–5.20)
RDW: 13.6 % (ref 11.5–14.5)
WBC: 12 10*3/uL — ABNORMAL HIGH (ref 3.6–11.0)
nRBC: 0 10*3/uL (ref 0.00–0.01)

## 2023-05-29 LAB — URINE DRUG SCREEN
Amphetamine, Urine: NEGATIVE
Barbiturates, Urine: NEGATIVE
Benzodiazepines, Urine: NEGATIVE
Cocaine, Urine: NEGATIVE
Methadone, Urine: NEGATIVE
Opiates, Urine: POSITIVE — AB
Phencyclidine, Urine: NEGATIVE
THC, TH-Cannabinol, Urine: NEGATIVE

## 2023-05-29 LAB — COMPREHENSIVE METABOLIC PANEL
ALT: 33 U/L (ref 12–78)
AST: 19 U/L (ref 15–37)
Albumin/Globulin Ratio: 0.6 — ABNORMAL LOW (ref 1.1–2.2)
Albumin: 2 g/dL — ABNORMAL LOW (ref 3.5–5.0)
Alk Phosphatase: 93 U/L (ref 45–117)
Anion Gap: 6 mmol/L (ref 5–15)
BUN/Creatinine Ratio: 17 (ref 12–20)
BUN: 23 mg/dL — ABNORMAL HIGH (ref 6–20)
CO2: 24 mmol/L (ref 21–32)
Calcium: 7.7 mg/dL — ABNORMAL LOW (ref 8.5–10.1)
Chloride: 110 mmol/L — ABNORMAL HIGH (ref 97–108)
Creatinine: 1.35 mg/dL — ABNORMAL HIGH (ref 0.55–1.02)
Est, Glom Filt Rate: 51 mL/min/{1.73_m2} — ABNORMAL LOW (ref >60)
Globulin: 3.5 g/dL (ref 2.0–4.0)
Glucose: 150 mg/dL — ABNORMAL HIGH (ref 65–100)
Potassium: 3.5 mmol/L (ref 3.5–5.1)
Sodium: 140 mmol/L (ref 136–145)
Total Bilirubin: 0.1 mg/dL — ABNORMAL LOW (ref 0.2–1.0)
Total Protein: 5.5 g/dL — ABNORMAL LOW (ref 6.4–8.2)

## 2023-05-29 LAB — HEMOGLOBIN A1C
Estimated Avg Glucose: 166 mg/dL
Hemoglobin A1C: 7.4 % — ABNORMAL HIGH (ref 4.0–5.6)

## 2023-05-29 LAB — PHOSPHORUS: Phosphorus: 3.1 mg/dL (ref 2.6–4.7)

## 2023-05-29 LAB — MAGNESIUM: Magnesium: 1.9 mg/dL (ref 1.6–2.4)

## 2023-05-29 LAB — PROCALCITONIN: Procalcitonin: 0.05 ng/mL

## 2023-05-29 MED ORDER — METOCLOPRAMIDE HCL 10 MG PO TABS
10 | Freq: Three times a day (TID) | ORAL | Status: DC
Start: 2023-05-29 — End: 2023-05-29
  Administered 2023-05-29: 10:00:00 5 mg via ORAL

## 2023-05-29 MED ORDER — ONDANSETRON 4 MG PO TBDP
4 | Freq: Three times a day (TID) | ORAL | Status: DC | PRN
Start: 2023-05-29 — End: 2023-05-29

## 2023-05-29 MED ORDER — CEPHALEXIN 250 MG PO CAPS
250 | Freq: Four times a day (QID) | ORAL | Status: DC
Start: 2023-05-29 — End: 2023-05-29

## 2023-05-29 MED ORDER — AMLODIPINE BESYLATE 5 MG PO TABS
5 MG | Freq: Every day | ORAL | Status: AC
Start: 2023-05-29 — End: 2023-05-29
  Administered 2023-05-29: 14:00:00 10 mg via ORAL

## 2023-05-29 MED ORDER — ENOXAPARIN SODIUM 40 MG/0.4ML IJ SOSY
40 | Freq: Every evening | INTRAMUSCULAR | Status: DC
Start: 2023-05-29 — End: 2023-05-29
  Administered 2023-05-29: 01:00:00 40 mg via SUBCUTANEOUS

## 2023-05-29 MED ORDER — POLYETHYLENE GLYCOL 3350 17 G PO PACK
17 | Freq: Three times a day (TID) | ORAL | Status: DC
Start: 2023-05-29 — End: 2023-05-29
  Administered 2023-05-29 (×2): 17 g via ORAL

## 2023-05-29 MED ORDER — CEPHALEXIN 500 MG PO CAPS
500 | ORAL_CAPSULE | Freq: Four times a day (QID) | ORAL | 0 refills | Status: DC
Start: 2023-05-29 — End: 2023-05-29

## 2023-05-29 MED ORDER — DEXTROSE 10 % IV BOLUS
INTRAVENOUS | Status: AC | PRN
Start: 2023-05-29 — End: 2023-05-29

## 2023-05-29 MED ORDER — CEPHALEXIN 500 MG PO CAPS
500 | ORAL_CAPSULE | Freq: Two times a day (BID) | ORAL | 0 refills | Status: AC
Start: 2023-05-29 — End: 2023-06-04

## 2023-05-29 MED ORDER — AMITRIPTYLINE HCL 10 MG PO TABS
10 MG | Freq: Every evening | ORAL | Status: AC
Start: 2023-05-29 — End: 2023-05-29
  Administered 2023-05-29: 01:00:00 10 mg via ORAL

## 2023-05-29 MED ORDER — INSULIN LISPRO 100 UNIT/ML IJ SOLN
100 | Freq: Three times a day (TID) | INTRAMUSCULAR | Status: DC
Start: 2023-05-29 — End: 2023-05-29

## 2023-05-29 MED ORDER — NORMAL SALINE FLUSH 0.9 % IV SOLN
0.9 | Freq: Two times a day (BID) | INTRAVENOUS | Status: DC
Start: 2023-05-29 — End: 2023-05-29
  Administered 2023-05-29 (×2): 10 mL via INTRAVENOUS

## 2023-05-29 MED ORDER — CEPHALEXIN 250 MG PO CAPS
250 | Freq: Two times a day (BID) | ORAL | Status: DC
Start: 2023-05-29 — End: 2023-05-29
  Administered 2023-05-29: 14:00:00 500 mg via ORAL

## 2023-05-29 MED ORDER — ONDANSETRON 4 MG PO TBDP
4 MG | ORAL_TABLET | Freq: Three times a day (TID) | ORAL | 0 refills | Status: DC | PRN
Start: 2023-05-29 — End: 2023-11-06

## 2023-05-29 MED ORDER — STERILE WATER FOR INJECTION (MIXTURES ONLY)
1 | INTRAMUSCULAR | Status: DC
Start: 2023-05-29 — End: 2023-05-29
  Administered 2023-05-29: 06:00:00 1000 mg via INTRAVENOUS

## 2023-05-29 MED ORDER — BISACODYL 10 MG RE SUPP
10 | Freq: Every day | RECTAL | Status: DC | PRN
Start: 2023-05-29 — End: 2023-05-29

## 2023-05-29 MED ORDER — PANTOPRAZOLE SODIUM 40 MG PO TBEC
40 | ORAL_TABLET | Freq: Two times a day (BID) | ORAL | 1 refills | 90.00 days | Status: DC
Start: 2023-05-29 — End: 2024-02-23

## 2023-05-29 MED ORDER — PROCHLORPERAZINE EDISYLATE 10 MG/2ML IJ SOLN
10 MG/2ML | Freq: Four times a day (QID) | INTRAMUSCULAR | Status: DC | PRN
Start: 2023-05-29 — End: 2023-05-29

## 2023-05-29 MED ORDER — SENNOSIDES 8.6 MG PO TABS
8.6 | ORAL | 0 refills | 30.00 days | Status: AC
Start: 2023-05-29 — End: 2023-06-28

## 2023-05-29 MED ORDER — GLUCOSE 4 G PO CHEW
4 | ORAL | Status: DC | PRN
Start: 2023-05-29 — End: 2023-05-29

## 2023-05-29 MED ORDER — SODIUM CHLORIDE 0.9 % IV BOLUS
0.9 | Freq: Once | INTRAVENOUS | Status: AC
Start: 2023-05-29 — End: 2023-05-28
  Administered 2023-05-29: 01:00:00 1000 mL via INTRAVENOUS

## 2023-05-29 MED ORDER — LACTATED RINGERS IV BOLUS
Freq: Once | INTRAVENOUS | Status: AC
Start: 2023-05-29 — End: 2023-05-29
  Administered 2023-05-29: 14:00:00 1000 mL via INTRAVENOUS

## 2023-05-29 MED ORDER — LACTATED RINGERS IV SOLN
INTRAVENOUS | Status: DC
Start: 2023-05-29 — End: 2023-05-29
  Administered 2023-05-29: 03:00:00 via INTRAVENOUS

## 2023-05-29 MED ORDER — PANTOPRAZOLE SODIUM 40 MG IV SOLR
40 | Freq: Two times a day (BID) | INTRAVENOUS | Status: DC
Start: 2023-05-29 — End: 2023-05-29
  Administered 2023-05-29 (×2): 40 mg via INTRAVENOUS

## 2023-05-29 MED ORDER — SODIUM CHLORIDE 0.9 % IV SOLN
0.9 | INTRAVENOUS | Status: DC | PRN
Start: 2023-05-29 — End: 2023-05-29

## 2023-05-29 MED ORDER — POLYETHYLENE GLYCOL 3350 17 GM/SCOOP PO POWD
17 | Freq: Every day | ORAL | 1 refills | 27.00 days | Status: DC
Start: 2023-05-29 — End: 2023-09-14

## 2023-05-29 MED ORDER — DEXTROSE 10 % IV SOLN
10 | INTRAVENOUS | Status: DC | PRN
Start: 2023-05-29 — End: 2023-05-29

## 2023-05-29 MED ORDER — LOSARTAN POTASSIUM 25 MG PO TABS
25 | Freq: Every day | ORAL | Status: DC
Start: 2023-05-29 — End: 2023-05-29

## 2023-05-29 MED ORDER — GLUCAGON (RDNA) 1 MG IJ KIT
1 MG | INTRAMUSCULAR | Status: AC | PRN
Start: 2023-05-29 — End: 2023-05-29

## 2023-05-29 MED ORDER — LISINOPRIL 20 MG PO TABS
20 | Freq: Every day | ORAL | Status: DC
Start: 2023-05-29 — End: 2023-05-29
  Administered 2023-05-29: 14:00:00 40 mg via ORAL

## 2023-05-29 MED ORDER — METOPROLOL SUCCINATE ER 50 MG PO TB24
50 | Freq: Every day | ORAL | Status: DC
Start: 2023-05-29 — End: 2023-05-29
  Administered 2023-05-29 (×2): 50 mg via ORAL

## 2023-05-29 MED ORDER — SENNOSIDES 8.6 MG PO TABS
8.6 | Freq: Two times a day (BID) | ORAL | Status: DC
Start: 2023-05-29 — End: 2023-05-29
  Administered 2023-05-29 (×2): 8.6 via ORAL

## 2023-05-29 MED ORDER — METOPROLOL TARTRATE 5 MG/5ML IV SOLN
5 | Freq: Once | INTRAVENOUS | Status: AC
Start: 2023-05-29 — End: 2023-05-28
  Administered 2023-05-29: 01:00:00 5 mg via INTRAVENOUS

## 2023-05-29 MED ORDER — DEXTROSE 10 % IV BOLUS
INTRAVENOUS | Status: DC | PRN
Start: 2023-05-29 — End: 2023-05-29

## 2023-05-29 MED ORDER — METOCLOPRAMIDE HCL 5 MG PO TABS
5 | ORAL | 1 refills | 28.00 days | Status: AC
Start: 2023-05-29 — End: ?

## 2023-05-29 MED ORDER — HYDRALAZINE HCL 20 MG/ML IJ SOLN
20 MG/ML | INTRAMUSCULAR | Status: AC | PRN
Start: 2023-05-29 — End: 2023-05-29
  Administered 2023-05-29 (×2): 10 mg via INTRAVENOUS

## 2023-05-29 MED ORDER — INSULIN LISPRO 100 UNIT/ML IJ SOLN
100 UNIT/ML | Freq: Every evening | INTRAMUSCULAR | Status: AC
Start: 2023-05-29 — End: 2023-05-29

## 2023-05-29 MED ORDER — ACETAMINOPHEN 500 MG PO TABS
500 | Freq: Three times a day (TID) | ORAL | Status: DC
Start: 2023-05-29 — End: 2023-05-29
  Administered 2023-05-29: 14:00:00 1000 mg via ORAL

## 2023-05-29 MED ORDER — NORMAL SALINE FLUSH 0.9 % IV SOLN
0.9 | INTRAVENOUS | Status: DC | PRN
Start: 2023-05-29 — End: 2023-05-29

## 2023-05-29 MED ORDER — ONDANSETRON HCL 4 MG/2ML IJ SOLN
4 | Freq: Four times a day (QID) | INTRAMUSCULAR | Status: DC | PRN
Start: 2023-05-29 — End: 2023-05-29

## 2023-05-29 MED FILL — CEFTRIAXONE SODIUM 1 G IJ SOLR: 1 g | INTRAMUSCULAR | Qty: 1000

## 2023-05-29 MED FILL — PANTOPRAZOLE SODIUM 40 MG IV SOLR: 40 MG | INTRAVENOUS | Qty: 40

## 2023-05-29 MED FILL — LISINOPRIL 20 MG PO TABS: 20 MG | ORAL | Qty: 2

## 2023-05-29 MED FILL — AMLODIPINE BESYLATE 5 MG PO TABS: 5 MG | ORAL | Qty: 2

## 2023-05-29 MED FILL — ACETAMINOPHEN EXTRA STRENGTH 500 MG PO TABS: 500 MG | ORAL | Qty: 2

## 2023-05-29 MED FILL — POLYETHYLENE GLYCOL 3350 17 G PO PACK: 17 g | ORAL | Qty: 1

## 2023-05-29 MED FILL — DEXTROSE 10 % IV SOLN: 10 % | INTRAVENOUS | Qty: 500

## 2023-05-29 MED FILL — SENOKOT 8.6 MG PO TABS: 8.6 MG | ORAL | Qty: 1

## 2023-05-29 MED FILL — BD POSIFLUSH 0.9 % IV SOLN: 0.9 % | INTRAVENOUS | Qty: 40

## 2023-05-29 MED FILL — METOPROLOL TARTRATE 5 MG/5ML IV SOLN: 5 MG/ML | INTRAVENOUS | Qty: 5

## 2023-05-29 MED FILL — LACTATED RINGERS IV SOLN: INTRAVENOUS | Qty: 1000

## 2023-05-29 MED FILL — HYDRALAZINE HCL 20 MG/ML IJ SOLN: 20 MG/ML | INTRAMUSCULAR | Qty: 1

## 2023-05-29 MED FILL — METOPROLOL SUCCINATE ER 25 MG PO TB24: 25 MG | ORAL | Qty: 2

## 2023-05-29 MED FILL — ENOXAPARIN SODIUM 40 MG/0.4ML IJ SOSY: 40 MG/0.4ML | INTRAMUSCULAR | Qty: 0.4

## 2023-05-29 MED FILL — CEPHALEXIN 250 MG PO CAPS: 250 MG | ORAL | Qty: 2

## 2023-05-29 MED FILL — METOPROLOL SUCCINATE ER 50 MG PO TB24: 50 MG | ORAL | Qty: 1

## 2023-05-29 MED FILL — SODIUM CHLORIDE 0.9 % IV SOLN: 0.9 % | INTRAVENOUS | Qty: 1000

## 2023-05-29 MED FILL — METOCLOPRAMIDE HCL 10 MG PO TABS: 10 MG | ORAL | Qty: 1

## 2023-05-29 MED FILL — AMITRIPTYLINE HCL 10 MG PO TABS: 10 MG | ORAL | Qty: 1

## 2023-05-29 NOTE — Discharge Summary (Signed)
Hospitalist Discharge Summary     Patient ID:  Kelsey Miles  [id number]  42 y.o.  31-Dec-1980    Admit date: 05/28/2023    Discharge date and time: 05/29/2023    Admission Diagnoses: Generalized abdominal pain [R10.84]  Nausea & vomiting [R11.2]  Nausea and vomiting, unspecified vomiting type [R11.2]    Discharge Diagnoses:    Principal Problem:    Nausea & vomiting  Active Problems:    Nausea and vomiting    Abdominal pain    AKI (acute kidney injury) (HCC)    Diabetes mellitus type 2 with complications (HCC)    Hypertension    High cholesterol    Intractable nausea and vomiting    Gastroparesis    CKD stage 3 due to type 2 diabetes mellitus (HCC)    Dehydration    Pyuria    Constipation    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 *         Hospital Course:   Nausea and vomiting / Abdominal pain / Gastroparesis / Constipation: POA and recurrent.  Full negative GI workup on recent admission. Improved with PPI, laxatives, reglan and treating of UTI below. I suspect gastroparesis exacerbated by UTI.  - reglan  - miralax  - PCP follow up     Acute kidney injury / Dehydration / CKD stage 3 / Proteinuria: POA due to poor PO intake. Improved with hydration. Seen by Dr Sherrine Maples last month, recommending ACE/ARB  - restart home lisinopril  - Check BMP at follow up      Hypertension: POA. 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 Chronic  - Cont home meds     Hyperlipidemia: POA. Chronic  - no meds. Should be on medium intensity statin as she is diabetic - defer to PCP     Anxiety and depression: POA. Chronic  - Cont home meds     Pyuria: POA. Acute. Contaminated first sample. No leukocytosis or fever.  Repeated UA with sterile cath sample, showing bacteria and less contamination. I suspect UTI  - CTX > keflex at discharge    Imaging  CT ABDOMEN PELVIS W IV CONTRAST Additional Contrast? None    Result Date:  05/28/2023  No acute intraperitoneal process is identified. Incidental and/or nonemergent findings are as described above. Electronically signed by Job Founds       PCP: Orlean Bradford, APRN - NP     Consults: none    Condition of patient at discharge: Improved    Discharge Exam:    Physical Exam:    Gen:  in no acute distress  HEENT:  Pink conjunctivae, EOMI, hearing intact to voice, moist mucous membranes  Resp: No accessory muscle use, clear breath sounds without wheezes rales or rhonchi  Card: No murmurs, normal S1, S2 without thrills, bruits or peripheral edema  Abd:  Soft, non-tender, non-distended  Musc: No cyanosis or clubbing  Skin: No rashes or ulcers, skin turgor is good  Neuro:  Cranial nerves are grossly intact, no focal motor weakness, follows commands appropriately  Psych:  good insight, oriented to person, place and time, alert          Disposition: home    Patient Instructions:   Current Discharge Medication List        START taking these medications    Details   polyethylene glycol (GLYCOLAX) 17  GM/SCOOP powder Take 17 g by mouth daily  Qty: 1530 g, Refills: 1      senna (SENOKOT) 8.6 MG tablet Take 1 tablet by mouth 2 times daily  Qty: 60 tablet, Refills: 0      cephALEXin (KEFLEX) 500 MG capsule Take 1 capsule by mouth every 12 hours for 12 doses  Qty: 12 capsule, Refills: 0           CONTINUE these medications which have CHANGED    Details   metoclopramide (REGLAN) 5 MG tablet Take 1 tablet by mouth 3 times daily (before meals)  Qty: 90 tablet, Refills: 1      ondansetron (ZOFRAN-ODT) 4 MG disintegrating tablet Take 1 tablet by mouth every 8 hours as needed for Nausea or Vomiting  Qty: 30 tablet, Refills: 0      pantoprazole (PROTONIX) 40 MG tablet Take 1 tablet by mouth 2 times daily (before meals)  Qty: 60 tablet, Refills: 1           CONTINUE these medications which have NOT CHANGED    Details   insulin 70-30 (NOVOLIN 70/30) (70-30) 100 UNIT per ML injection vial Inject 15 Units into the  skin 2 times daily  Qty: 9 mL, Refills: 1      lisinopril (PRINIVIL;ZESTRIL) 40 MG tablet Take 1 tablet by mouth daily  Qty: 30 tablet, Refills: 2      amitriptyline (ELAVIL) 10 MG tablet Take 1 tablet by mouth nightly  Qty: 30 tablet, Refills: 1      metoprolol succinate (TOPROL XL) 50 MG extended release tablet Take 1 tablet by mouth daily  Qty: 30 tablet, Refills: 2      amLODIPine (NORVASC) 10 MG tablet Take 1 tablet by mouth daily  Qty: 30 tablet, Refills: 2      naloxone (NARCAN) 4 MG/0.1ML LIQD nasal spray 1 spray by Nasal route as needed for Opioid Reversal  Qty: 2 each, Refills: 0           Activity: activity as tolerated  Diet: regular diet  Wound Care: none needed    Follow-up with Idowu, [place], APRN - NP in 7 days  Follow-up tests/labs CBC,CMP    Approximate time spent in patient care on day of discharge:    Signed:  Renelda Loma, MD  05/29/2023  11:09 AM

## 2023-05-29 NOTE — Consults (Signed)
Session ID: [id number]  Language: Spanish  Interpreter ID: #[id number]  Interpreter Name: Palma Holter

## 2023-05-29 NOTE — Discharge Summary (Signed)
Hospitalist Discharge Summary     Patient ID:  Melinda [name]  [id number]  42 y.o.  31-Dec-1980    Admit date: 05/28/2023    Discharge date and time: 05/29/2023    Admission Diagnoses: Generalized abdominal pain [R10.84]  Nausea & vomiting [R11.2]  Nausea and vomiting, unspecified vomiting type [R11.2]    Discharge Diagnoses:    Principal Problem:    Nausea & vomiting  Active Problems:    Nausea and vomiting    Abdominal pain    AKI (acute kidney injury) (HCC)    Diabetes mellitus type 2 with complications (HCC)    Hypertension    High cholesterol    Intractable nausea and vomiting    Gastroparesis    CKD stage 3 due to type 2 diabetes mellitus (HCC)    Dehydration    Pyuria    Constipation    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 *         Hospital Course:   Nausea and vomiting / Abdominal pain / Gastroparesis / Constipation: POA and recurrent.  Full negative GI workup on recent admission. Improved with PPI, laxatives, reglan and treating of UTI below. I suspect gastroparesis exacerbated by UTI.  - reglan  - miralax  - PCP follow up     Acute kidney injury / Dehydration / CKD stage 3 / Proteinuria: POA due to poor PO intake. Improved with hydration. Seen by Dr Sherrine Maples last month, recommending ACE/ARB  - restart home lisinopril  - Check BMP at follow up      Hypertension: POA. 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 Chronic  - Cont home meds     Hyperlipidemia: POA. Chronic  - no meds. Should be on medium intensity statin as she is diabetic - defer to PCP     Anxiety and depression: POA. Chronic  - Cont home meds     Pyuria: POA. Acute. Contaminated first sample. No leukocytosis or fever.  Repeated UA with sterile cath sample, showing bacteria and less contamination. I suspect UTI  - CTX > keflex at discharge    Imaging  CT ABDOMEN PELVIS W IV CONTRAST Additional Contrast? None    Result Date:  05/28/2023  No acute intraperitoneal process is identified. Incidental and/or nonemergent findings are as described above. Electronically signed by Job Founds       PCP: Orlean Bradford, APRN - NP     Consults: none    Condition of patient at discharge: Improved    Discharge Exam:    Physical Exam:    Gen:  in no acute distress  HEENT:  Pink conjunctivae, EOMI, hearing intact to voice, moist mucous membranes  Resp: No accessory muscle use, clear breath sounds without wheezes rales or rhonchi  Card: No murmurs, normal S1, S2 without thrills, bruits or peripheral edema  Abd:  Soft, non-tender, non-distended  Musc: No cyanosis or clubbing  Skin: No rashes or ulcers, skin turgor is good  Neuro:  Cranial nerves are grossly intact, no focal motor weakness, follows commands appropriately  Psych:  good insight, oriented to person, place and time, alert          Disposition: home    Patient Instructions:   Current Discharge Medication List        START taking these medications    Details   polyethylene glycol (GLYCOLAX) 17  GM/SCOOP powder Take 17 g by mouth daily  Qty: 1530 g, Refills: 1      senna (SENOKOT) 8.6 MG tablet Take 1 tablet by mouth 2 times daily  Qty: 60 tablet, Refills: 0      cephALEXin (KEFLEX) 500 MG capsule Take 1 capsule by mouth every 12 hours for 12 doses  Qty: 12 capsule, Refills: 0           CONTINUE these medications which have CHANGED    Details   metoclopramide (REGLAN) 5 MG tablet Take 1 tablet by mouth 3 times daily (before meals)  Qty: 90 tablet, Refills: 1      ondansetron (ZOFRAN-ODT) 4 MG disintegrating tablet Take 1 tablet by mouth every 8 hours as needed for Nausea or Vomiting  Qty: 30 tablet, Refills: 0      pantoprazole (PROTONIX) 40 MG tablet Take 1 tablet by mouth 2 times daily (before meals)  Qty: 60 tablet, Refills: 1           CONTINUE these medications which have NOT CHANGED    Details   insulin 70-30 (NOVOLIN 70/30) (70-30) 100 UNIT per ML injection vial Inject 15 Units into the  skin 2 times daily  Qty: 9 mL, Refills: 1      lisinopril (PRINIVIL;ZESTRIL) 40 MG tablet Take 1 tablet by mouth daily  Qty: 30 tablet, Refills: 2      amitriptyline (ELAVIL) 10 MG tablet Take 1 tablet by mouth nightly  Qty: 30 tablet, Refills: 1      metoprolol succinate (TOPROL XL) 50 MG extended release tablet Take 1 tablet by mouth daily  Qty: 30 tablet, Refills: 2      amLODIPine (NORVASC) 10 MG tablet Take 1 tablet by mouth daily  Qty: 30 tablet, Refills: 2      naloxone (NARCAN) 4 MG/0.1ML LIQD nasal spray 1 spray by Nasal route as needed for Opioid Reversal  Qty: 2 each, Refills: 0           Activity: activity as tolerated  Diet: regular diet  Wound Care: none needed    Follow-up with Idowu, Funmilola, APRN - NP in 7 days  Follow-up tests/labs CBC,CMP    Approximate time spent in patient care on day of discharge:    Signed:  Renelda Loma, MD  05/29/2023  11:09 AM

## 2023-05-29 NOTE — Care Coordination-Inpatient (Incomplete)
05/29/2023  9:15 AM     05/29/23 0913   Readmission Assessment   Number of Days since last admission? 8-30 days  (Discharged 8/3)   Previous Disposition Home with Family   Who is being Interviewed Patient   What was the patient's/caregiver's perception as to why they think they needed to return back to the hospital? Other (Comment)  (pt became symptomatic N/V, pain)   Did you visit your Primary Care Physician after you left the hospital, before you returned this time? No   Why weren't you able to visit your PCP? Did not have an appointment   Did you see a specialist, such as Cardiac, Pulmonary, Orthopedic Physician, etc. after you left the hospital? No   Who advised the patient to return to the hospital? Self-referral   Does the patient report anything that got in the way of taking their medications? No   In our efforts to provide the best possible care to you and others like you, can you think of anything that we could have done to help you after you left the hospital the first time, so that you might not have needed to return so soon? Other (Comment)  (Nothing)     Reason for Readmission:     Abdominal pain, N/V  Pt is a 42 yo female emergently admitted 05/28/23 OBS for abdominal pain, medically stable for DC, DC order noted.  CM met w/ pt confirmed demographics          RUR Score/Risk Level:     Low Risk of Readmission/Green      PCP: First and Last name:    pt uses [facility] (pt is uninsured)   Name of Practice:    Are you a current patient: Yes/No:    Approximate date of last visit:    Can you participate in a virtual visit with your PCP:     Is a Care Conference indicated: NO      Did you attend your follow up appointment (s):  If not, why not:pt had no scheduled follow up          Resources/supports as identified by patient/family:   pt has supportive family        Top Challenges facing patient (as identified by patient/family and CM):         Finances/Medication cost?     Pt is uninsured, uses  [employer], Dtr Toney Reil will transport at [employer] system or lack thereof?     Pt has supportive family to assist w/ needs  Living arrangements?         Pt lives w/ spouse in private residence  Self-care/ADLs/Cognition?     Pt is able to perform ADLs independently,  AAO x 4        Current Advanced Directive/Advance Care Plan:  No AMD, HCDM is NOK           Plan for utilizing home health:   pt is independent at baseline for ADLs/iADLs             Transition of Care Plan:    Based on readmission, the patient's previous Plan of Care   has been evaluated and/or modified. The current Transition of Care Plan is:         RUR N/A Low Risk of Readmission  LOS

## 2023-05-29 NOTE — Progress Notes (Signed)
Advance to full liquids. Give IV fluid bolus. Restart lisinopril.Discharge after lunch if tolerates FLD. Otherwise cont current plan. Full note to follow.    Renelda Loma, MD

## 2023-05-29 NOTE — Plan of Care (Signed)
Problem: Pain  Goal: Verbalizes/displays adequate comfort level or baseline comfort level  Outcome: Progressing

## 2023-05-29 NOTE — Consults (Signed)
Session ID: [id number]  Language: Spanish  Interpreter ID: #[id number]  Interpreter Name: Geri Seminole

## 2023-05-30 ENCOUNTER — Emergency Department
Admission: EM | Admit: 2023-05-30 | Discharge: 2023-05-31 | Disposition: A | Discharge status: Home / Self Care | Payer: Medicaid Other | Attending: Student in an Organized Health Care Education/Training Program | Admitting: Student in an Organized Health Care Education/Training Program

## 2023-05-30 DIAGNOSIS — E785 Hyperlipidemia, unspecified: Secondary | ICD-10-CM | POA: Insufficient documentation

## 2023-05-30 DIAGNOSIS — F4320 Adjustment disorder, unspecified: Secondary | ICD-10-CM

## 2023-05-30 DIAGNOSIS — F419 Anxiety disorder, unspecified: Secondary | ICD-10-CM | POA: Insufficient documentation

## 2023-05-30 LAB — ECG 12-LEAD
P-R Interval: 146 ms
Q-T Interval: 392 ms
R Axis: -61 degrees
T Axis: 50 degrees

## 2023-05-30 LAB — WHOLE BLOOD GLUCOSE POCT: Whole Blood Glucose POCT: 162 mg/dL — ABNORMAL HIGH (ref 70–100)

## 2023-05-30 LAB — CULTURE, URINE: Colony count: >100000

## 2023-05-30 NOTE — ED Notes (Signed)
Dexi 162

## 2023-05-30 NOTE — ED Provider Notes (Signed)
[facility] EMERGENCY DEPARTMENT  ATTENDING PHYSICIAN HISTORY AND PHYSICAL EXAM     Patient Name: DANIAL, FLIPPIN  Encounter Date:  05/30/2023  Attending Physician: Maurine Simmering, MD  Room:  Z04-W/Z04-W  Patient DOB:  September 26, 1981  Age: 42 y.o. female  MRN:  [id number]  PCP: Sherol Dade, MD     CC: Chest Pain, Anxiety, and Headache    HPI: Josette Laursen is a 42 y.o. y.o female w/ PMH of anxiety, HTN, HLD, and DM who presents with ~1 day of chest pressure, increased anxiety, and body aches. ***    Vitals:   Vitals:    05/30/23 2058 05/30/23 2106   BP:  (!) 166/96   Pulse: 90 91   Resp:  22   Temp:  98.2 F (36.8 C)   TempSrc:  Temporal   SpO2: 99% 99%   Weight: 59 kg    Height: 4\' 11"  (1.499 m)        Physical Exam:   General: Awake, alert, no acute distress   HEENT: Atraumatic, normocephalic, conjunctiva anicteric   Neck: No obvious masses   CV: Regular rate, rhythm   Respiratory: Non-labored. Equal chest rise   Abdomen: Non-distended. Non-tender.  Musculoskeletal: No gross extremity deformities. No swelling.  Skin: No lesions. No rashes.  Neurological: Awake. Alert. Follows commands. Moves all extremities spontaneously. Mental status is at baseline.    ED Medications Given:   Medications - No data to display       ED Lab Results:  Labs Reviewed   WHOLE BLOOD GLUCOSE POCT - Abnormal; Notable for the following components:       Result Value    Whole Blood Glucose POCT 162 (*)     All other components within normal limits   URINALYSIS WITH REFLEX TO MICROSCOPIC EXAM - REFLEX TO CULTURE   LAB USE ONLY - URINE GRAY CULTURE HOLD TUBE   CBC AND DIFFERENTIAL   COMPREHENSIVE METABOLIC PANEL   HIGH SENSITIVITY TROPONIN-I   HIGH SENSITIVITY TROPONIN-I WITH DELTA   POCT PREGNANCY TEST, URINE HCG       ED Imaging Results:  No orders to display        Interpretations and Critical Care:   - EKG: I have reviewed and independently interpreted the patient's EKG as: Normal sinus rhythm at 88. LAD. Normal PR, QRS,  QTc interval. No ST changes.   - Radiology: I have reviewed and independently interpreted the following radiology studies: ***  O2 Sat:  The patient's oxygen saturation was 99 % on room air. This was independently interpreted by me as Normal.     Heart Score      Flowsheet Row Value   History 0   EKG 0   Risk Factors 2   Total (with age) 2   Onset of pain (time of START of last episode of chest pain)? >3 hrs ago            Procedures:  Procedures           Medical Decision Making: Differential diagnoses included but is not limited to: {[id number]}    The patient's past medical records, including those in Care Everywhere when necessary, were reviewed by me.    Diagnosis:      Disposition:         Scribe Attestation:  I was acting as a Neurosurgeon for Maurine Simmering, MD on TOTO AMBROS,Matylda MARIA  Scribe: Julieanne Cotton     I am the first  provider for this patient and I personally performed the services documented. Julieanne Cotton is scribing for me on TOTO AMBROS,Orel MARIA. This note accurately reflects work and decisions made by me.  Maurine Simmering, MD

## 2023-05-30 NOTE — EDIE (Signed)
[facility] NOTIFICATION 05/30/2023 20:57 Kelsey Miles, Kelsey Miles DOB: 12-17-1980 MRN: [id number]    Criteria Met      3 Different Facilities in 90 Days    5 ED Visits in 12 Months    History of Suicide Ideation or Self-Harm (12 mo.)    Security and Safety  No Security Events were found.  ED Care Guidelines  There are currently no ED Care Guidelines for this patient. Please check your facility's medical records system.    Flags      History of Sepsis - Patient has received a diagnosis of Sepsis from an acute or post-acute setting. Apply appropriate clinical planning practices; to learn more visit [email or web address] / Attributed By: [employer] / Attributed On: 01/14/2023       Prescription Monitoring Program  Narx Score not available at this time.    E.D. Visit Count (12 mo.)  Facility Visits   [facility] 8   [facility] 2   [facility] 2   [facility] 1   Total 13   Note: Visits indicate total known visits.     Recent Emergency Department Visit Summary  Showing 10 most recent visits out of 13 in the past 12 months   Date Facility [facility] Type Diagnoses or Chief Complaint    May 30, 2023  [facility]  [place].  [place]  Emergency      Anxiety      Chest Pain      Headache      Triage      May 28, 2023  [facility] [place] [place].  [place].  [place]  Emergency      Generalized abdominal pain      Headache, unspecified      Pain, unspecified      Nausea with vomiting, unspecified      May 16, 2023  [facility]  [place]  Emergency  Chief Complaint: abd pain    May 08, 2023  [facility] [place].  [place].  [place]  Emergency      Other specified abnormal findings of blood chemistry      Hyperglycemia, unspecified      Unspecified abdominal pain      Nausea with vomiting, unspecified      Mar 16, 2023  [facility] [place]  [place].  [place]  Emergency      Nausea with vomiting, unspecified       Epigastric pain      Hypokalemia      Hypertensive urgency      Mar 11, 2023  [facility]  [place].  [place]  Emergency  Chief Complaint: VOMITING ABDOMINAL AND BACK PAIN    Feb 18, 2023  [facility]  [place].  [place]  Emergency  Chief Complaint: nausea / vomiting    Feb 18, 2023  [facility]  [place]  [place]  Emergency  Chief Complaint: headache    Feb 17, 2023  [facility] [place].  [place].  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 pylori] as the cause of diseases classified elsewhere      Epigastric pain      Peptic ulcer, site unspecified, unspecified as acute or chronic, without hemorrhage or perforation  Feb 10, 2023  [facility] [place]  [place].  [place]  Emergency      Epigastric pain      Acquired absence of other specified parts of digestive tract      Nausea with vomiting, unspecified      Hypokalemia      Other specified postprocedural states        Recent Inpatient Visit Summary  Date Facility [facility] Type Diagnoses or Chief Complaint    May 08, 2023  [facility] [place].  [place].  [place]  Internal Medicine      Nausea with vomiting, unspecified      Other specified abnormal findings of blood chemistry      Hyperglycemia, unspecified      Unspecified abdominal pain      Mar 16, 2023  [facility] [place]  [place].  [place]  Medical Surgical      Tachycardia, unspecified      Hypokalemia      Nausea with vomiting, unspecified      Hypertensive urgency      Epigastric pain      Mar 11, 2023  [facility]  [place].  [place]  General Medicine      1. Other Gram-negative sepsis      Feb 18, 2023  [facility]  [place].  [place]  General Medicine  Chief Complaint: nausea / vomiting    Feb 10, 2023  [facility] [place]. [place].  [place].  [place]  Medical Surgical      Hypokalemia      Hyperglycemia, unspecified      Other specified postprocedural states      Unspecified abdominal pain       Epigastric pain      Acquired absence of other specified parts of digestive tract      Nausea with vomiting, unspecified      Feb 01, 2023  [facility] [place].  [place].  [place]  Inpatient      Right upper quadrant pain      Acute kidney failure, unspecified      Nausea with vomiting, unspecified      Epigastric pain      Unspecified abdominal pain      Jan 13, 2023  [facility] [place].  [place].  [place]  Medical Surgical      Unspecified abdominal pain      Urinary tract infection, site not specified      Anemia, unspecified      Sepsis, unspecified organism        Care Team  No Care Team was found.  [facility]  This patient has registered at the [facility] Emergency Department  For more information visit: [email or web address]     PLEASE NOTE:     1.   Any care recommendations and other clinical information are provided as guidelines or for historical purposes only, and providers should exercise their own clinical judgment when providing care.    2.   You may only use this information for purposes of treatment, payment or health care operations activities, and subject to the limitations of applicable [facility] Policies.    3.   You should consult directly with the organization that provided a care guideline or other clinical history with any questions about additional information or accuracy or completeness of information provided.    ? 2024 [facility] -  [email or web address]

## 2023-05-31 LAB — URINALYSIS WITH REFLEX TO MICROSCOPIC EXAM - REFLEX TO CULTURE
Urine Bilirubin: NEGATIVE
Urine Ketones: NEGATIVE mg/dL
Urine Leukocyte Esterase: NEGATIVE
Urine Nitrite: NEGATIVE
Urine Specific Gravity: 1.016 (ref 1.001–1.035)
Urine Urobilinogen: NORMAL mg/dL (ref 0.2–2.0)
Urine pH: 7 (ref 5.0–8.0)

## 2023-05-31 LAB — POCT PREGNANCY TEST, URINE HCG: POCT Pregnancy HCG Test, UR: NEGATIVE

## 2023-05-31 LAB — LAB USE ONLY - CBC WITH DIFFERENTIAL
Absolute Basophils: 0.05 10*3/uL (ref 0.00–0.08)
Absolute Eosinophils: 0.41 10*3/uL (ref 0.00–0.44)
Absolute Immature Granulocytes: 0.04 10*3/uL (ref 0.00–0.07)
Absolute Lymphocytes: 2.35 10*3/uL (ref 0.42–3.22)
Absolute Monocytes: 0.82 10*3/uL (ref 0.21–0.85)
Absolute Neutrophils: 4.99 10*3/uL (ref 1.10–6.33)
Absolute nRBC: 0 10*3/uL (ref <=0.00)
Basophils %: 0.6 %
Eosinophils %: 4.7 %
Hematocrit: 29.6 % — ABNORMAL LOW (ref 34.7–43.7)
Hemoglobin: 10 g/dL — ABNORMAL LOW (ref 11.4–14.8)
Immature Granulocytes %: 0.5 %
Lymphocytes %: 27.1 %
MCH: 29.2 pg (ref 25.1–33.5)
MCHC: 33.8 g/dL (ref 31.5–35.8)
MCV: 86.5 fL (ref 78.0–96.0)
MPV: 8.8 fL — ABNORMAL LOW (ref 8.9–12.5)
Monocytes %: 9.5 %
Neutrophils %: 57.6 %
Platelet Count: 424 10*3/uL — ABNORMAL HIGH (ref 142–346)
Preliminary Absolute Neutrophil Count: 4.99 10*3/uL (ref 1.10–6.33)
RBC: 3.42 10*6/uL — ABNORMAL LOW (ref 3.90–5.10)
RDW: 14 % (ref 11–15)
WBC: 8.66 10*3/uL (ref 3.10–9.50)
nRBC %: 0 /100 WBC (ref <=0.0)

## 2023-05-31 LAB — ECG 12-LEAD
Atrial Rate: 88 {beats}/min
IHS MUSE NARRATIVE AND IMPRESSION: NORMAL
P Axis: 34 degrees
QRS Duration: 82 ms
QTC Calculation (Bezet): 474 ms
Ventricular Rate: 88 {beats}/min

## 2023-05-31 LAB — COMPREHENSIVE METABOLIC PANEL
ALT: 32 U/L (ref 0–55)
AST (SGOT): 28 U/L (ref 5–41)
Albumin/Globulin Ratio: 0.7 — ABNORMAL LOW (ref 0.9–2.2)
Albumin: 2.5 g/dL — ABNORMAL LOW (ref 3.5–5.0)
Alkaline Phosphatase: 104 U/L (ref 37–117)
Anion Gap: 7 (ref 5.0–15.0)
BUN: 14 mg/dL (ref 7–21)
Bilirubin, Total: 0.2 mg/dL (ref 0.2–1.2)
CO2: 22 mEq/L (ref 17–29)
Calcium: 8.4 mg/dL — ABNORMAL LOW (ref 8.5–10.5)
Chloride: 107 mEq/L (ref 99–111)
Creatinine: 1.3 mg/dL — ABNORMAL HIGH (ref 0.4–1.0)
GFR: 52.4 mL/min/{1.73_m2} — ABNORMAL LOW (ref >=60.0)
Globulin: 3.5 g/dL (ref 2.0–3.6)
Glucose: 124 mg/dL — ABNORMAL HIGH (ref 70–100)
Potassium: 3.9 mEq/L (ref 3.5–5.3)
Protein, Total: 6 g/dL (ref 6.0–8.3)
Sodium: 136 mEq/L (ref 135–145)

## 2023-05-31 LAB — HIGH SENSITIVITY TROPONIN-I: hs Troponin: 6.5 ng/L (ref <=14.0)

## 2023-05-31 LAB — HIGH SENSITIVITY TROPONIN-I WITH DELTA
hs Troponin-I Delta: 1
hs Troponin: 5.7 ng/L (ref <=14.0)

## 2023-05-31 LAB — LAB USE ONLY - URINE GRAY CULTURE HOLD TUBE

## 2023-05-31 LAB — LIPASE: Lipase: 12 U/L (ref 8–78)

## 2023-05-31 MED ORDER — HYDROXYZINE PAMOATE 25 MG PO CAPS
50.0000 mg | ORAL_CAPSULE | Freq: Three times a day (TID) | ORAL | 0 refills | Status: AC | PRN
Start: 2023-05-31 — End: ?

## 2023-05-31 MED ORDER — HYDROXYZINE PAMOATE 25 MG PO CAPS
50.0000 mg | ORAL_CAPSULE | Freq: Three times a day (TID) | ORAL | 0 refills | Status: DC | PRN
Start: 2023-05-31 — End: 2023-05-31

## 2023-05-31 MED ORDER — MELATONIN 3 MG PO TABS
3.0000 mg | ORAL_TABLET | Freq: Every evening | ORAL | 0 refills | Status: AC | PRN
Start: 2023-05-31 — End: ?

## 2023-05-31 MED ORDER — HYDROXYZINE HCL 25 MG PO TABS
25.0000 mg | ORAL_TABLET | Freq: Once | ORAL | Status: AC
Start: 2023-05-31 — End: 2023-05-31
  Administered 2023-05-31: 25 mg via ORAL
  Filled 2023-05-31: qty 1

## 2023-05-31 MED ORDER — MELATONIN 3 MG PO TABS
3.0000 mg | ORAL_TABLET | Freq: Every evening | ORAL | 0 refills | Status: DC | PRN
Start: 2023-05-31 — End: 2023-05-31

## 2023-05-31 NOTE — ED Triage Notes (Signed)
Patient presents for evaluation of a number of complaints. For the past four months, patient has been endorsing nausea, vomiting, intermittent chest pain, and generalized weakness. Patient reports a number of admissions for theses symptoms, but no apparent cause for her symptoms. Patient states that she is feeling "very anxious" and "very nervous." Patient also reporting difficulties sleeping, poor appetite, and depression. Patient denies suicidal ideations, homicidal ideations, and auditory and visual hallucinations. Patient is awake and alert.

## 2023-05-31 NOTE — Discharge Instructions (Signed)
Kelsey Miles lo atendieron para una evaluacin de ansiedad. Mientras estuvo aqu, le realizamos una historia clnica y un examen fsico. Su evaluacin mdica no mostr signos de afecciones mdicas que requieran una intervencin de Training and development officer.    Si le recetaron algn medicamento, tmelo segn lo prescrito.    Sin embargo, aunque su evaluacin mdica no mostr una patologa aguda que requiera una intervencin en este momento, ser importante que haga un seguimiento con su mdico de atencin primaria y el equipo de salud conductual.          Kelsey Miles    You were seen today for evaluation of anxiety. While you were here, we performed a history and physical examination. Your medical evaluation did not show signs of medical conditions requiring emergent intervention at this time.    If there were any medications prescribed, please take them as prescribed.     However, even though your medical workup did not show acute pathology requiring intervention at this time, it will be important for you to follow up with your primary care doctor and behavioral health team.    IF YOU DO NOT CONTINUE TO IMPROVE OR YOUR CONDITION WORSENS, PLEASE CONTACT YOUR DOCTOR OR RETURN IMMEDIATELY TO THE EMERGENCY DEPARTMENT.    Sincerely,  Maurine Simmering, MD  Attending Emergency Physician  [facility] Emergency Department    -------------------------------------------------------------------------------------------------------------------------------------     OBTAINING A PRIMARY CARE APPOINTMENT    Primary care physicians (PCPs, also known as primary care doctors) are either internists or family medicine doctors. Both types of PCPs focus on health promotion, disease prevention, patient education and counseling, and treatment of acute and chronic medical conditions.    If you need a primary care doctor, please call the below number and ask who is receiving new patients.     [facility]  Telephone:  [phone]  [email or web address]

## 2023-05-31 NOTE — Consults (Signed)
[facility] Access Initial Consult    [facility] Kelsey Miles is a 42 y.o. female admitted to the [facility] Emergency Department who was seen via telemedicine with their consent on 05/31/2023 by Whitney Muse. Pt orient 4x; TW introduced self, role, and reason for the evaluation to the pt. PT is spanish speaking only and utilized and language interpreter [id number]) during the entire evaluation.       Call Details  Patient Location: [facility] ED  Patient Room Number: Z04-W  Time contacted by ED Physician: 2122  Time consult began: 0215  Time (in minutes) from Call to Consult: 293  Time consult concluded: 0258  Referring ED Department  Emergency Department: [facility]  --  [place] Suicide Severity Rating Scale (Short Version)  1. In the past month - Have you wished you were dead or wished you could go to sleep and not wake up?: No  2. In the past month - Have you actually had any thoughts of killing yourself?: No  6. Have you ever done anything, started to do anything, or prepared to do anything to end your life: No  CSSRS Risk Level : No risk    Specific Questioning About Thoughts, Plans, and Suicidal Intent (SAFE-T)  How many times have you had these thoughts? (Past Month): Does not apply  When you have the thoughts how long do they last? (Past Month): Does not apply  Could/can you stop thinking about killing yourself or wanting to die if you want to? (Past Month): Does not attempt to control thoughts  Are there things - anyone or anything (e.g. family, religion, pain of death) - that stopped you from wanting to die or acting on thoughts of suicide? (Past Month): Does not apply  What sort of reasons did you have for thinking about wanting to die or killing yourself?  Was it to end the pain or stop the way you were feeling (in other words you couldn't go on living with this pain or how you were feeling) or was it to get attention: Does not apply  Total Score of Intensity of Ideation: 0    Step 2:  Identify Risk Factors  Clinical Status (Current/Recent): Patient denies  Clinical Status (Lifetime): Patient denies  Precipitants/Stressors: Chronic physical pain or acute medical problem (CNS disorder, COPD, cancer, etc)  Treatment History / Dx: Patient denies  Access to lethal methods: No, does not have access to lethal methods    Step 3: Identify Protective Factors  Internal: Identifies reasons for living  External: Cultural, spiritual and/or moral attitudes against suicide, Responsibility to family or others, living with family, Supportive social networks or family    Step 4: Guidelines to Determine Level of Risk  Suicide Risk Level: Low    Step 5: Possible Interventions to LOWER Risk Level  Behavioral Health Ambulatory, or Emergency Department: Give emergency/crisis phone numbers, Mental heatlh referral at discharge  Behavioral Health Inpatient Unit:  (n/a)    Step 6: Documentation  Summary of Evaluation: Pt presents with multiple admissions for intractable nausea and vomiting with no specific cause identified. Denied SI, HI and AVH. Psych evaluation was requested by the ED team due to concerns for anxiety. Pt reproted further medical concerns that are unrelated to anxiety or mental health conditions. Pt is clear for discharge from mental health and TW expressed the pts medical concerns from this evaluation.  --  Presenting Problem: Per Triage note; "Pt presents for evaluation of a number of complaints. For  the past four months, patient has been endorsing nausea, vomiting, intermittent chest pain, and generalized weakness. Pt reports a number of admissions for theses symptoms, but no apparent cause for her symptoms. Pt states that she is feeling "very anxious" and "very nervous." Pt also reporting difficulties sleeping, poor appetite, and depression. Denied SI, HI and AVH."    Current major stressors: PT expressed concerns around continued medical condition.     Psychiatric History  Inpatient treatment history:  PT denied  Outpatient treatment history (current & past): PT denied  Diagnoses (past): PT denied  Prior medication trials: Pt denied  Suicide Attempts/self- Injurious behaviors: PT denied  Trauma History:PT denied  Hx of violence/aggression:     Violence Toward Others  Within the Last 6 Months:: no history of violence toward others  Greater than 6 Months Ago:: no history of violence toward others    Violence Toward Self  Within the Last 6 Months:: no history of violence toward self  Greater than 6 Months Ago:: no history of violence toward self    Medical History (include active and chronic medical conditions)  Medical History[1]     Substance Use History  Alcohol/Drug Use History  Alcohol use within the past 12 months?: No  Alcohol use greater than 12 months ago?: No  Drug use within the past 12 months?: No  Drug use greater than 12 months ago?: No    Substance Abuse History  Previous Substance Abuse Treatment?: No  Recovery and Support Involvement  Current Involvement: None  Past Involvement: None  Sponsor (Yes/No): no  Have you been prescribed medications to support recovery?: None  Recovery Resources/Support: family  Legal Guardian/Parent: n/a  Support Systems: Spouse/significant other, Children, Family members  Substance Recovery Support  Have you been prescribed medications to support recovery?: None  Recovery Resources/Support: family  Transportation Available: family  Past Withdrawal Symptoms  Past Withdrawal Symptoms: None  History of Blackouts?: No  History of Withdrawal Seizures?: No    Home Medications (names, doses, frequency, psychiatric, non psychiatric, adherent/nonadherent): see chart     Social History:   Living Arrangements: Family members, Spouse/significant other  Type of Residence: Private residence  Support Systems: Spouse/significant other, Children, Family members  Employment status/occupation: PT denied  Legal history: PT denied    Family History (psych dx, substance use, suicide attempts):  Pt denied    Presenting Mental Status  Orientation Level: Oriented x 4  Consciousness: Alert  Memory: No Impairment  Thought Content: Within Normal Limits  Thought Process: Linear and goal-directed  Perception: No perceptual disturbances  Mood: Anxious  Affect: Full Range  Attitude: Cooperative  Behavior: Restless  Speech: Within Normal Limits  Eye Contact: Fleeting  Appearance: Appropriatly dressed  Insight: Good  Judgment: Good  Impulse Control: Intact  Concentration: Intact  Sleep: Difficulty falling asleep  Energy: Decreased  Appetite: Decreased  Weight change?: Within Normal Limits  Reliability of Reporter/Patient: Good      Summary: Pt is a 42 y.o. female with a hx of hypertension, insulin-dependent diabetes 2, recurrent epigastric pain with intractable nausea and vomiting of unclear etiology presents to the emergency room with severe upper abdominal pain 8-9 out of 10, constant, associated with several episodes of nausea and vomiting with liquid and yellow bile. Starting in April 2024, pt has presented with multiple admissions for intractable nausea and vomiting with no specific cause identified per pt. Around that time, pt was treated for sepsis. Per EMR, it has been suspect pt may have gastroparesis due  to her diabetes. Per Triage note; "Pt presents for evaluation of a number of complaints. For the past four months, patient has been endorsing nausea, vomiting, intermittent chest pain, and generalized weakness. Pt reports a number of admissions for theses symptoms, but no apparent cause for her symptoms. Pt states that she is feeling "very anxious" and "very nervous." Pt also reporting difficulties sleeping, poor appetite, and depression. Denied SI, HI and AVH."    During the evaluation with TW, pt orient 4x; denied SI, HI and AVH. PT expressed feelings of increased levels of anxiety with difficulty sleeping and eating over the past few weeks. Pt would frequently and abruptly stand up, rock side to side, and  clinch her hands together. When confronted, pt stated that she felt like she was shivering and uncomfortable for unknown reasons. Pt reports difficulty urinating, feeling the urge to, but, having difficulty going. She reported further symptoms of cold/ hot flashes, confusion, back pain, light headedness, and sweats for no reason. Pt expressed that she has never been diagnosed with or treated for a mental health condition. She gains about 2-4 hours of sleep per night, with low energy and appetite. Pt has been on a liquid diet for the past few months. She denied a hx of substance use, violence/ aggression, suicide attempts, or SIB. Pt denied experiences of paranoia, delusions, or mania.    During the discussion of treatment options, TW recommended that the pt continued follow up with medical treatment in the ED. Pt expressed understanding. TW consulted with Dr. Mickie Bail (ED physician) and expressed the pts concerns via epic chat.        Diagnosis: Preliminary Diagnosis #1: F43.20 Adjustment disorder, unspecified                Preliminary Diagnosis (DSM IV)  Axis I: F43.20 Adjustment disorder, unspecified  Axis II: deferred  Axis III: see chart  Axis IV: Other (comment) (unknown medical condition)  Axis V on Admission: UTA  Axis V - Highest in Past Year: UTA     Patient expects to be discharged to:: Medical observation  Disposition  Disposition Outcome - Choose One: All Other  Other Disposition Sources: IPAC      Justification for disposition: Pt denied SI, HI, AVH or any safety concerns. PT does not appear to meet IP psych admission criteria and did not appear appear under the duress of psychosis or mania. Her thought process was logical, linear and goal and future oriented. Pt would benefit from further medical observation and treatment.          Whitney Muse, M.A., M.Ed.,     [facility]  [address]  [place], [place] [address]  [phone]         [1]   Past  Medical History:  Diagnosis Date    Abnormal vision     Diabetes     Gout     Headache     Hyperlipidemia     not currently on meds     Hypertension     not currently on medication     Type 2 diabetes mellitus, controlled

## 2023-05-31 NOTE — ED Notes (Addendum)
Pt. On zoom call with interpretor for mental health eval

## 2023-06-11 ENCOUNTER — Emergency Department: Admit: 2023-06-11 | Payer: MEDICAID | Primary: Adult Health

## 2023-06-11 ENCOUNTER — Inpatient Hospital Stay
Admit: 2023-06-11 | Discharge: 2023-06-11 | Disposition: A | Discharge status: Home / Self Care | Payer: MEDICAID | Attending: Emergency Medicine

## 2023-06-11 DIAGNOSIS — K3184 Gastroparesis: Secondary | ICD-10-CM

## 2023-06-11 LAB — URINALYSIS WITH REFLEX TO CULTURE
Bilirubin, Urine: NEGATIVE
Glucose, Ur: >1000 mg/dL — AB
Ketones, Urine: NEGATIVE mg/dL
Leukocyte Esterase, Urine: NEGATIVE
Nitrite, Urine: NEGATIVE
Protein, UA: >300 mg/dL — AB
Specific Gravity, UA: 1.019 (ref 1.003–1.030)
Urobilinogen, Urine: 0.2 EU/dL (ref 0.2–1.0)
pH, Urine: 6 (ref 5.0–8.0)

## 2023-06-11 LAB — COMPREHENSIVE METABOLIC PANEL
ALT: 38 U/L (ref 12–78)
AST: 25 U/L (ref 15–37)
Albumin/Globulin Ratio: 0.5 — ABNORMAL LOW (ref 1.1–2.2)
Albumin: 2.7 g/dL — ABNORMAL LOW (ref 3.5–5.0)
Alk Phosphatase: 157 U/L — ABNORMAL HIGH (ref 45–117)
Anion Gap: 10 mmol/L (ref 5–15)
BUN/Creatinine Ratio: 17 (ref 12–20)
BUN: 27 mg/dL — ABNORMAL HIGH (ref 6–20)
CO2: 19 mmol/L — ABNORMAL LOW (ref 21–32)
Calcium: 9.2 mg/dL (ref 8.5–10.1)
Chloride: 104 mmol/L (ref 97–108)
Creatinine: 1.57 mg/dL — ABNORMAL HIGH (ref 0.55–1.02)
Est, Glom Filt Rate: 42 mL/min/{1.73_m2} — ABNORMAL LOW (ref >60)
Globulin: 5 g/dL — ABNORMAL HIGH (ref 2.0–4.0)
Glucose: 324 mg/dL — ABNORMAL HIGH (ref 65–100)
Potassium: 3.8 mmol/L (ref 3.5–5.1)
Sodium: 133 mmol/L — ABNORMAL LOW (ref 136–145)
Total Bilirubin: 0.4 mg/dL (ref 0.2–1.0)
Total Protein: 7.7 g/dL (ref 6.4–8.2)

## 2023-06-11 LAB — CBC WITH AUTO DIFFERENTIAL
Basophils %: 1 % (ref 0–1)
Basophils Absolute: 0.1 10*3/uL (ref 0.0–0.1)
Eosinophils %: 1 % (ref 0–7)
Eosinophils Absolute: 0.2 10*3/uL (ref 0.0–0.4)
Hematocrit: 36.6 % (ref 35.0–47.0)
Hemoglobin: 13 g/dL (ref 11.5–16.0)
Immature Granulocytes %: 0 % (ref 0.0–0.5)
Immature Granulocytes Absolute: 0.1 10*3/uL — ABNORMAL HIGH (ref 0.00–0.04)
Lymphocytes %: 16 % (ref 12–49)
Lymphocytes Absolute: 1.8 10*3/uL (ref 0.8–3.5)
MCH: 30 pg (ref 26.0–34.0)
MCHC: 35.5 g/dL (ref 30.0–36.5)
MCV: 84.3 FL (ref 80.0–99.0)
MPV: 9 FL (ref 8.9–12.9)
Monocytes %: 4 % — ABNORMAL LOW (ref 5–13)
Monocytes Absolute: 0.5 10*3/uL (ref 0.0–1.0)
Neutrophils %: 77 % — ABNORMAL HIGH (ref 32–75)
Neutrophils Absolute: 8.7 10*3/uL — ABNORMAL HIGH (ref 1.8–8.0)
Nucleated RBCs: 0 /100{WBCs}
Platelets: 498 10*3/uL — ABNORMAL HIGH (ref 150–400)
RBC: 4.34 M/uL (ref 3.80–5.20)
RDW: 13.4 % (ref 11.5–14.5)
WBC: 11.2 10*3/uL — ABNORMAL HIGH (ref 3.6–11.0)
nRBC: 0 10*3/uL (ref 0.00–0.01)

## 2023-06-11 LAB — POC PREGNANCY UR-QUAL: Preg Test, Ur: NEGATIVE

## 2023-06-11 LAB — LIPASE: Lipase: 44 U/L (ref 13–75)

## 2023-06-11 MED ORDER — DICYCLOMINE HCL 20 MG PO TABS
20 | ORAL_TABLET | Freq: Four times a day (QID) | ORAL | 0 refills | Status: DC | PRN
Start: 2023-06-11 — End: 2023-09-14

## 2023-06-11 MED ORDER — DROPERIDOL 2.5 MG/ML IJ SOLN
2.5 | Freq: Once | INTRAMUSCULAR | Status: AC
Start: 2023-06-11 — End: 2023-06-11

## 2023-06-11 MED ORDER — ONDANSETRON HCL 4 MG/2ML IJ SOLN
4 | INTRAMUSCULAR | Status: DC
Start: 2023-06-11 — End: 2023-06-11

## 2023-06-11 MED ORDER — HYDROMORPHONE HCL 1 MG/ML IJ SOLN
1 | INTRAMUSCULAR | Status: AC
Start: 2023-06-11 — End: 2023-06-11

## 2023-06-11 MED ORDER — METOCLOPRAMIDE HCL 10 MG PO TABS
10 | ORAL_TABLET | Freq: Four times a day (QID) | ORAL | 0 refills | 28.00 days | Status: DC | PRN
Start: 2023-06-11 — End: 2023-06-17

## 2023-06-11 MED ORDER — SODIUM CHLORIDE 0.9 % IV BOLUS
0.9 | Freq: Once | INTRAVENOUS | Status: AC
Start: 2023-06-11 — End: 2023-06-11

## 2023-06-11 MED ORDER — HYDROMORPHONE HCL PF 1 MG/ML IJ SOLN
1 | INTRAMUSCULAR | Status: AC
Start: 2023-06-11 — End: 2023-06-11

## 2023-06-11 MED ORDER — KETOROLAC TROMETHAMINE 15 MG/ML IJ SOLN
15 | INTRAMUSCULAR | Status: AC
Start: 2023-06-11 — End: 2023-06-11

## 2023-06-11 MED ADMIN — HYDROmorphone (DILAUDID) 1 MG/ML injection: 1 | INTRAVENOUS | @ 12:00:00 | NDC 76045000901

## 2023-06-11 MED ADMIN — droPERidol (INAPSINE) injection 1.25 mg: 1.25 mg | INTRAVENOUS | @ 11:00:00 | NDC 00517970201

## 2023-06-11 MED ADMIN — ketorolac (TORADOL) injection 15 mg: 15 mg | INTRAVENOUS | @ 11:00:00 | NDC 63323016100

## 2023-06-11 MED ADMIN — sodium chloride 0.9 % bolus 1,000 mL: 1000 mL | INTRAVENOUS | @ 11:00:00 | NDC 00338004904

## 2023-06-11 MED FILL — HYDROMORPHONE HCL 1 MG/ML IJ SOLN: 1 MG/ML | INTRAMUSCULAR | Qty: 1

## 2023-06-11 MED FILL — SODIUM CHLORIDE 0.9 % IV SOLN: 0.9 % | INTRAVENOUS | Qty: 1000

## 2023-06-11 MED FILL — DROPERIDOL 2.5 MG/ML IJ SOLN: 2.5 MG/ML | INTRAMUSCULAR | Qty: 2

## 2023-06-11 MED FILL — KETOROLAC TROMETHAMINE 15 MG/ML IJ SOLN: 15 MG/ML | INTRAMUSCULAR | Qty: 1

## 2023-06-11 MED FILL — ONDANSETRON HCL 4 MG/2ML IJ SOLN: 4 MG/2ML | INTRAMUSCULAR | Qty: 2

## 2023-06-11 NOTE — ED Notes (Signed)
Pt was discharged by Dr . Jones  Pt verbalized good understanding of all discharge instructions, prescriptions, and follow up care.   All questions answered.  Pt in stable condition on discharge.

## 2023-06-11 NOTE — ED Provider Notes (Signed)
 [facility] EMERGENCY DEPT  EMERGENCY DEPARTMENT ENCOUNTER      Pt Name: Melinda [name]  MRN: [id number]  Birthdate 1980-11-30  Date of evaluation: 06/11/2023  Provider: Butler Molt, DO      HISTORY OF PRESENT ILLNESS      The history is provided by the patient and a relative. The history is limited by a language barrier. A language interpreter was used.     42 year old female with a history of gastroparesis, DM, with recent admission for abdominal pain and intractable nausea and vomiting returns to the emergency department complaining of the same which started last night.  She states that it feels very similar to prior episodes and denies any fever or new pains.  On arrival to the emergency department the patient is moaning and rolling around in bed in obvious discomfort.  History is provided predominantly by daughter at the bedside.      Nursing Notes were reviewed.    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 MAIN OR    COLONOSCOPY N/A 01/19/2023    COLONOSCOPY DIAGNOSTIC performed by Madlyn Fendt, MD at [facility] ENDOSCOPY    OTHER SURGICAL HISTORY Left     Rentia attachment    TUBAL LIGATION Bilateral     UPPER GASTROINTESTINAL ENDOSCOPY N/A 01/17/2023    ESOPHAGOGASTRODUODENOSCOPY performed by Madlyn Fendt, MD at [facility] ENDOSCOPY    UPPER GASTROINTESTINAL ENDOSCOPY N/A 01/17/2023    ESOPHAGOGASTRODUODENOSCOPY BIOPSY performed by Madlyn Fendt, MD at [facility] ENDOSCOPY    UPPER  GASTROINTESTINAL ENDOSCOPY N/A 01/18/2023    ESOPHAGOGASTRODUODENOSCOPY performed by Madlyn Fendt, MD at [facility] ENDOSCOPY    UPPER GASTROINTESTINAL ENDOSCOPY N/A 05/13/2023    ESOPHAGOGASTRODUODENOSCOPY performed by Roseann Lonni BIRCH, MD at [facility] ENDOSCOPY    UPPER GASTROINTESTINAL ENDOSCOPY N/A 05/13/2023    ESOPHAGOGASTRODUODENOSCOPY BIOPSY performed by Roseann Lonni BIRCH, MD at [facility] ENDOSCOPY    US  ABSCESS DRAINAGE PERITONEAL  02/11/2023    US  ABSCESS DRAINAGE PERITONEAL 02/11/2023 [facility] RAD US          CURRENT MEDICATIONS       Discharge Medication List as of 06/11/2023  9:23 AM        CONTINUE these medications which have NOT CHANGED    Details   polyethylene glycol (GLYCOLAX ) 17 GM/SCOOP powder Take 17 g by mouth daily, Disp-1530 g, R-1Normal      ondansetron  (ZOFRAN -ODT) 4 MG disintegrating tablet Take 1 tablet by mouth every 8 hours as needed for Nausea or Vomiting, Disp-30 tablet, R-0Normal      pantoprazole  (PROTONIX ) 40 MG tablet Take 1 tablet by mouth 2 times daily (before meals), Disp-60 tablet, R-1Normal      senna (SENOKOT) 8.6 MG tablet Take 1 tablet by mouth 2 times daily, Disp-60 tablet, R-0Normal  amitriptyline  (ELAVIL ) 10 MG tablet Take 1 tablet by mouth nightly, Disp-30 tablet, R-1Normal      insulin  70-30 (NOVOLIN  70/30) (70-30) 100 UNIT per ML injection vial Inject 15 Units into the skin 2 times daily, Disp-9 mL, R-1Normal      lisinopril  (PRINIVIL ;ZESTRIL ) 40 MG tablet Take 1 tablet by mouth daily, Disp-30 tablet, R-2Normal      metoprolol  succinate (TOPROL  XL) 50 MG extended release tablet Take 1 tablet by mouth daily, Disp-30 tablet, R-2Normal      amLODIPine  (NORVASC ) 10 MG tablet Take 1 tablet by mouth daily, Disp-30 tablet, R-2Normal      naloxone  (NARCAN ) 4 MG/0.1ML LIQD nasal spray 1 spray by Nasal route as needed for Opioid Reversal, Disp-2 each, R-0Normal             ALLERGIES     Patient has no known allergies.    FAMILY HISTORY     No family history on file.       SOCIAL HISTORY        Social History     Socioeconomic History    Marital status: Married   Tobacco Use    Smoking status: Never    Smokeless tobacco: Never   Vaping Use    Vaping status: Never Used   Substance and Sexual Activity    Alcohol use: Never    Drug use: Never     Social Determinants of Health     Food Insecurity: No Food Insecurity (05/28/2023)    Hunger Vital Sign     Worried About Running Out of Food in the Last Year: Never true     Ran Out of Food in the Last Year: Never true   Recent Concern: Food Insecurity - Food Insecurity Present (03/16/2023)    Hunger Vital Sign     Worried About Running Out of Food in the Last Year: Sometimes true     Ran Out of Food in the Last Year: Sometimes true   Transportation Needs: No Transportation Needs (05/28/2023)    PRAPARE - Therapist, Art (Medical): No     Lack of Transportation (Non-Medical): No    Social Connections ([facility] HRSN)   Housing Stability: Low Risk  (05/28/2023)    Housing Stability Vital Sign     Unable to Pay for Housing in the Last Year: No     Number of Times Moved in the Last Year: 0     Homeless in the Last Year: No         PHYSICAL EXAM       ED Triage Vitals [06/11/23 0703]   BP Systolic BP Percentile Diastolic BP Percentile Temp Temp Source Pulse Respirations SpO2   (!) 184/167 -- -- 98.1 F (36.7 C) Oral (!) 119 22 100 %      Height Weight - Scale         1.499 m (4' 11) 59 kg (130 lb 1.1 oz)             Body mass index is 26.27 kg/m.    Physical Exam  Vitals and nursing note reviewed.   Constitutional:       General: She is in acute distress.      Appearance: Normal appearance. She is ill-appearing.   HENT:      Head: Normocephalic and atraumatic.      Nose: Nose normal.      Mouth/Throat:      Mouth: Mucous membranes are moist.  Eyes:      Extraocular Movements: Extraocular movements intact.      Conjunctiva/sclera: Conjunctivae normal.      Pupils: Pupils are equal, round, and reactive to light.   Cardiovascular:      Rate and  Rhythm: Regular rhythm. Tachycardia present.      Heart sounds: Normal heart sounds.   Pulmonary:      Effort: Pulmonary effort is normal.      Breath sounds: Normal breath sounds.   Abdominal:      General: There is no distension.      Palpations: Abdomen is soft.      Tenderness: There is abdominal tenderness in the right upper quadrant, epigastric area and left upper quadrant.   Musculoskeletal:         General: No tenderness.      Cervical back: Neck supple.   Skin:     General: Skin is warm and dry.   Neurological:      General: No focal deficit present.      Mental Status: She is alert and oriented to person, place, and time.             EMERGENCY DEPARTMENT COURSE and DIFFERENTIAL DIAGNOSIS/MDM:   Vitals:    Vitals:    06/11/23 0815 06/11/23 0830 06/11/23 0845 06/11/23 0900   BP:    (!) 156/97   Pulse:    88   Resp:       Temp:       TempSrc:       SpO2: 100% 99% 97% 97%   Weight:       Height:             Medical Decision Making  Amount and/or Complexity of Data Reviewed  Labs: ordered.  Radiology: ordered.    Risk  Prescription drug management.    42 year old female with gastroparesis presents with nausea and vomiting and epigastric abdominal pain similar to prior episodes.  She is afebrile with hypertension and tachycardia but otherwise vital signs stable satting 100% on room air.  She was treated symptomatically in the ED with IV fluid bolus, Toradol , droperidol , dilaudid .    Labs returned showing WBC 11.2, normal Hg, unremarkable electrolytes, normal anion gap, slightly low bicarb at 19, elevated glucose at 324, BUN 27, creatinine 1.57, normal LFTs, normal lipase.  hCG negative.      UA is really contaminated with epithelial cells but negative leuk esterase and nitrate, low suspicion for UTI given no urinary symptoms reported.    CT abdomen pelvis shows no acute abnormalities.    She was reassessed after ED treatment and found to have improvement in her symptoms.  She was able to tolerate p.o. without  any further vomiting.  She was Rx'd dicyclomine  and Reglan  for further symptomatic relief and recommended GI follow-up for further evaluation.  Recommended emphasis on hydration with gradual reintroduction of normal diet starting with very easy to digest foods.  This was discussed with the patient and her family members at the bedside and they stated both understanding and agreement.      REASSESSMENT          CONSULTS:  None    PROCEDURES:     Procedures        (Please note that portions of this note were completed with a voice recognition program.  Efforts were made to edit the dictations but occasionally words are mis-transcribed.)    Butler Molt, DO (electronically signed)  Emergency Attending Physician  Joshua Butler RAMAN, DO  06/11/23 [phone]

## 2023-06-11 NOTE — ED Triage Notes (Signed)
 Pt presents to the ER today via Triage. Pt stating abdominal pain and back pain starting yesterday.  Pt also reports nausea and vomiting.

## 2023-06-12 LAB — CULTURE, URINE: Culture: NO GROWTH

## 2023-06-17 ENCOUNTER — Inpatient Hospital Stay
Admit: 2023-06-17 | Discharge: 2023-06-17 | Disposition: A | Discharge status: Home / Self Care | Payer: MEDICAID | Attending: Emergency Medicine

## 2023-06-17 DIAGNOSIS — R112 Nausea with vomiting, unspecified: Secondary | ICD-10-CM

## 2023-06-17 LAB — CBC WITH AUTO DIFFERENTIAL
Basophils %: 1 % (ref 0–1)
Basophils Absolute: 0.1 10*3/uL (ref 0.0–0.1)
Eosinophils %: 2 % (ref 0–7)
Eosinophils Absolute: 0.2 10*3/uL (ref 0.0–0.4)
Hematocrit: 35.1 % (ref 35.0–47.0)
Hemoglobin: 12.6 g/dL (ref 11.5–16.0)
Immature Granulocytes %: 0 % (ref 0–0.5)
Immature Granulocytes Absolute: 0 10*3/uL (ref 0.00–0.04)
Lymphocytes %: 17 % (ref 12–49)
Lymphocytes Absolute: 1.4 10*3/uL (ref 0.8–3.5)
MCH: 29.4 pg (ref 26.0–34.0)
MCHC: 35.9 g/dL (ref 30.0–36.5)
MCV: 81.8 FL (ref 80.0–99.0)
MPV: 8.8 FL — ABNORMAL LOW (ref 8.9–12.9)
Monocytes %: 6 % (ref 5–13)
Monocytes Absolute: 0.5 10*3/uL (ref 0.0–1.0)
Neutrophils %: 74 % (ref 32–75)
Neutrophils Absolute: 6.2 10*3/uL (ref 1.8–8.0)
Nucleated RBCs: 0 /100{WBCs}
Platelets: 431 10*3/uL — ABNORMAL HIGH (ref 150–400)
RBC: 4.29 M/uL (ref 3.80–5.20)
RDW: 13 % (ref 11.5–14.5)
WBC: 8.4 10*3/uL (ref 3.6–11.0)
nRBC: 0 10*3/uL (ref 0.00–0.01)

## 2023-06-17 LAB — COMPREHENSIVE METABOLIC PANEL
ALT: 39 U/L (ref 12–78)
AST: 28 U/L (ref 15–37)
Albumin/Globulin Ratio: 0.6 — ABNORMAL LOW (ref 1.1–2.2)
Albumin: 2.5 g/dL — ABNORMAL LOW (ref 3.5–5.0)
Alk Phosphatase: 149 U/L — ABNORMAL HIGH (ref 45–117)
Anion Gap: 11 mmol/L (ref 2–12)
BUN/Creatinine Ratio: 12 (ref 12–20)
BUN: 18 mg/dL (ref 6–20)
CO2: 26 mmol/L (ref 21–32)
Calcium: 9.3 mg/dL (ref 8.5–10.1)
Chloride: 100 mmol/L (ref 97–108)
Creatinine: 1.45 mg/dL — ABNORMAL HIGH (ref 0.55–1.02)
Est, Glom Filt Rate: 46 mL/min/{1.73_m2} — ABNORMAL LOW (ref >60)
Globulin: 4.5 g/dL — ABNORMAL HIGH (ref 2.0–4.0)
Glucose: 375 mg/dL — ABNORMAL HIGH (ref 65–100)
Potassium: 3.5 mmol/L (ref 3.5–5.1)
Sodium: 137 mmol/L (ref 136–145)
Total Bilirubin: 0.3 mg/dL (ref 0.2–1.0)
Total Protein: 7 g/dL (ref 6.4–8.2)

## 2023-06-17 LAB — URINALYSIS WITH REFLEX TO CULTURE
Bilirubin, Urine: NEGATIVE
Glucose, Ur: >1000 mg/dL — AB
Ketones, Urine: NEGATIVE mg/dL
Leukocyte Esterase, Urine: NEGATIVE
Nitrite, Urine: NEGATIVE
Protein, UA: >300 mg/dL — AB
RBC, UA: >100 /HPF — ABNORMAL HIGH (ref 0–5)
Specific Gravity, UA: 1.02 (ref 1.003–1.030)
Urobilinogen, Urine: 0.2 EU/dL (ref 0.2–1.0)
pH, Urine: 6.5 (ref 5.0–8.0)

## 2023-06-17 LAB — LIPASE: Lipase: 53 U/L (ref 13–75)

## 2023-06-17 LAB — BRAIN NATRIURETIC PEPTIDE: NT Pro-BNP: 3189 pg/mL — ABNORMAL HIGH (ref 0–125)

## 2023-06-17 LAB — MAGNESIUM: Magnesium: 1.6 mg/dL (ref 1.6–2.4)

## 2023-06-17 MED ORDER — SODIUM CHLORIDE 0.9 % IV BOLUS
0.9 | Freq: Once | INTRAVENOUS | Status: AC
Start: 2023-06-17 — End: 2023-06-17

## 2023-06-17 MED ORDER — DROPERIDOL 2.5 MG/ML IJ SOLN
2.5 | Freq: Once | INTRAMUSCULAR | Status: AC
Start: 2023-06-17 — End: 2023-06-17

## 2023-06-17 MED ORDER — MORPHINE SULFATE (PF) 4 MG/ML IJ SOLN
4 | INTRAMUSCULAR | Status: AC
Start: 2023-06-17 — End: 2023-06-17

## 2023-06-17 MED ORDER — METOCLOPRAMIDE HCL 10 MG PO TABS
10 | ORAL_TABLET | ORAL | 0 refills | 28.00 days | Status: DC | PRN
Start: 2023-06-17 — End: 2023-07-19

## 2023-06-17 MED ADMIN — sodium chloride 0.9 % bolus 1,000 mL: 1000 mL | INTRAVENOUS | @ 16:00:00 | NDC 00338004904

## 2023-06-17 MED ADMIN — morphine sulfate (PF) injection 4 mg: 4 mg | INTRAVENOUS | @ 17:00:00 | NDC 00641612501

## 2023-06-17 MED ADMIN — droPERidol (INAPSINE) injection 2.5 mg: 2.5 mg | INTRAVENOUS | @ 17:00:00 | NDC 00517970201

## 2023-06-17 MED FILL — DROPERIDOL 2.5 MG/ML IJ SOLN: 2.5 MG/ML | INTRAMUSCULAR | Qty: 2

## 2023-06-17 MED FILL — SODIUM CHLORIDE 0.9 % IV SOLN: 0.9 % | INTRAVENOUS | Qty: 1000

## 2023-06-17 MED FILL — MORPHINE SULFATE 4 MG/ML IJ SOLN: 4 mg/mL | INTRAMUSCULAR | Qty: 1

## 2023-06-17 NOTE — ED Triage Notes (Signed)
 Pt ambulated to the treatment area with a steady gait. Pt states I started vomiting yesterday my stomach and back are hurting I had something similar recently and I was admitted to [facility] for 1 week they said I had an infection. Pt denies diarrhea.

## 2023-06-17 NOTE — ED Provider Notes (Signed)
 [facility] EMERGENCY DEPT  EMERGENCY DEPARTMENT ENCOUNTER      Pt Name: Melinda [name]  MRN: [id number]  Birthdate [birth year]  Date of evaluation: 06/17/2023  Provider: Norleen Dollar, MD    CHIEF COMPLAINT       Chief Complaint   Patient presents with    Abdominal Pain    Emesis     b    Back Pain         HISTORY OF PRESENT ILLNESS   (Location/Symptom, Timing/Onset, Context/Setting, Quality, Duration, Modifying Factors, Severity)  Note limiting factors.   42 year old female with gastroparesis presents with nausea, vomiting, and epigastric abdominal pain similar to prior episodes. Per medical records, patient was seen at this facility with similar symptoms 1 week ago and she notes that she was also admitted to [facility] for 1 week.  She notes that she has been taking ondansetron  at home with limited relief of symptoms.  She notes that she is currently on her menstrual cycle.  She has no additional complaints at this time.    The history is provided by the patient.         Review of External Medical Records:     Nursing Notes were reviewed.    REVIEW OF SYSTEMS    (2-9 systems for level 4, 10 or more for level 5)     Review of Systems   Constitutional: Negative.    HENT: Negative.     Eyes: Negative.    Respiratory: Negative.     Cardiovascular: Negative.    Gastrointestinal:  Positive for diarrhea, nausea and vomiting.   Genitourinary: Negative.    Musculoskeletal: Negative.    Skin: Negative.    Neurological: Negative.    Psychiatric/Behavioral: Negative.         Except as noted above the remainder of the review of systems was reviewed and negative.       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 ENDOSCOPY    CHOLECYSTECTOMY, LAPAROSCOPIC N/A 02/03/2023    ROBOTIC LAPAROSCOPIC CHOLECYSTECTOMY with Indocyanine green  performed by Chrystal Elijah BRAVO, MD at [facility] MAIN OR    COLONOSCOPY N/A 01/19/2023    COLONOSCOPY DIAGNOSTIC performed by Madlyn Fendt, MD at [facility] ENDOSCOPY    OTHER SURGICAL HISTORY Left     Rentia attachment    TUBAL LIGATION Bilateral     UPPER GASTROINTESTINAL ENDOSCOPY N/A 01/17/2023    ESOPHAGOGASTRODUODENOSCOPY performed by Madlyn Fendt, MD at [facility] ENDOSCOPY    UPPER GASTROINTESTINAL ENDOSCOPY N/A 01/17/2023    ESOPHAGOGASTRODUODENOSCOPY BIOPSY performed by Madlyn Fendt, MD at [facility] ENDOSCOPY    UPPER GASTROINTESTINAL ENDOSCOPY N/A 01/18/2023    ESOPHAGOGASTRODUODENOSCOPY performed by Madlyn Fendt, MD at [facility] ENDOSCOPY    UPPER GASTROINTESTINAL ENDOSCOPY N/A 05/13/2023    ESOPHAGOGASTRODUODENOSCOPY performed by Roseann Lonni BIRCH, MD at [facility] ENDOSCOPY    UPPER GASTROINTESTINAL ENDOSCOPY N/A 05/13/2023    ESOPHAGOGASTRODUODENOSCOPY BIOPSY performed by Roseann Lonni BIRCH, MD at [facility] ENDOSCOPY    US  ABSCESS DRAINAGE PERITONEAL  02/11/2023    US  ABSCESS  DRAINAGE PERITONEAL 02/11/2023 [facility] RAD US          CURRENT MEDICATIONS       Discharge Medication List as of 06/17/2023  3:05 PM        CONTINUE these medications which have NOT CHANGED    Details   dicyclomine  (BENTYL ) 20 MG tablet Take 1 tablet by mouth every 6 hours as needed (abdominal cramping), Disp-20 tablet, R-0Normal      polyethylene glycol (GLYCOLAX ) 17 GM/SCOOP powder Take 17 g by mouth daily, Disp-1530 g, R-1Normal      ondansetron  (ZOFRAN -ODT) 4 MG disintegrating tablet Take 1 tablet by mouth every 8 hours as needed for Nausea or Vomiting, Disp-30 tablet, R-0Normal      pantoprazole  (PROTONIX ) 40 MG tablet Take 1 tablet by mouth 2 times daily (before meals), Disp-60 tablet, R-1Normal      senna (SENOKOT) 8.6 MG tablet Take 1 tablet by mouth 2 times daily, Disp-60 tablet, R-0Normal      amitriptyline  (ELAVIL ) 10 MG tablet Take 1  tablet by mouth nightly, Disp-30 tablet, R-1Normal      insulin  70-30 (NOVOLIN  70/30) (70-30) 100 UNIT per ML injection vial Inject 15 Units into the skin 2 times daily, Disp-9 mL, R-1Normal      lisinopril  (PRINIVIL ;ZESTRIL ) 40 MG tablet Take 1 tablet by mouth daily, Disp-30 tablet, R-2Normal      metoprolol  succinate (TOPROL  XL) 50 MG extended release tablet Take 1 tablet by mouth daily, Disp-30 tablet, R-2Normal      amLODIPine  (NORVASC ) 10 MG tablet Take 1 tablet by mouth daily, Disp-30 tablet, R-2Normal      naloxone  (NARCAN ) 4 MG/0.1ML LIQD nasal spray 1 spray by Nasal route as needed for Opioid Reversal, Disp-2 each, R-0Normal             ALLERGIES     Patient has no known allergies.    FAMILY HISTORY     History reviewed. No pertinent family history.       SOCIAL HISTORY       Social History     Socioeconomic History    Marital status: Married     Spouse name: None    Number of children: None    Years of education: None    Highest education level: None   Tobacco Use    Smoking status: Never    Smokeless tobacco: Never   Vaping Use    Vaping status: Never Used   Substance and Sexual Activity    Alcohol use: Never    Drug use: Never     Social Determinants of Health     Food Insecurity: No Food Insecurity (05/28/2023)    Hunger Vital Sign     Worried About Running Out of Food in the Last Year: Never true     Ran Out of Food in the Last Year: Never true   Recent Concern: Food Insecurity - Food Insecurity Present (03/16/2023)    Hunger Vital Sign     Worried About Running Out of Food in the Last Year: Sometimes true     Ran Out of Food in the Last Year: Sometimes true   Transportation Needs: No Transportation Needs (05/28/2023)    PRAPARE - Therapist, Art (Medical): No     Lack of Transportation (Non-Medical): No   Housing Stability: Low Risk  (05/28/2023)    Housing Stability Vital Sign     Unable to Pay for Housing in the Last Year: No  Number of Times Moved in the Last Year: 0      Homeless in the Last Year: No           PHYSICAL EXAM    (up to 7 for level 4, 8 or more for level 5)     ED Triage Vitals [06/17/23 1145]   BP Systolic BP Percentile Diastolic BP Percentile Temp Temp Source Pulse Respirations SpO2   (!) 182/108 -- -- 99.8 F (37.7 C) Oral (!) 127 20 100 %      Height Weight - Scale         1.499 m (4' 11) 59 kg (130 lb)             Body mass index is 26.26 kg/m.    Physical Exam  Vitals and nursing note reviewed.   Constitutional:       General: She is not in acute distress.     Appearance: Normal appearance. She is ill-appearing.      Comments: Young woman lying in bed appearing uncomfortable, holding emesis bag   HENT:      Head: Normocephalic and atraumatic.      Nose: Nose normal.      Mouth/Throat:      Mouth: Mucous membranes are moist.   Eyes:      Pupils: Pupils are equal, round, and reactive to light.   Cardiovascular:      Rate and Rhythm: Regular rhythm. Tachycardia present.   Pulmonary:      Effort: Pulmonary effort is normal. No respiratory distress.      Breath sounds: Normal breath sounds.   Abdominal:      General: There is no distension.      Palpations: Abdomen is soft.      Tenderness: There is no abdominal tenderness.   Musculoskeletal:      Cervical back: Normal range of motion.   Skin:     General: Skin is warm and dry.   Neurological:      General: No focal deficit present.      Mental Status: She is alert and oriented to person, place, and time.   Psychiatric:         Mood and Affect: Mood normal.         DIAGNOSTIC RESULTS     EKG: All EKG's are interpreted by the Emergency Department Physician who either signs or Co-signs this chart in the absence of a cardiologist.        RADIOLOGY:   Non-plain film images such as CT, Ultrasound and MRI are read by the radiologist. Plain radiographic images are visualized and preliminarily interpreted by the emergency physician with the below findings:        Interpretation per the Radiologist below, if available at  the time of this note:    No orders to display        LABS:  Labs Reviewed   CBC WITH AUTO DIFFERENTIAL - Abnormal; Notable for the following components:       Result Value    Platelets 431 (*)     MPV 8.8 (*)     All other components within normal limits   COMPREHENSIVE METABOLIC PANEL - Abnormal; Notable for the following components:    Glucose 375 (*)     Creatinine 1.45 (*)     Est, Glom Filt Rate 46 (*)     Alk Phosphatase 149 (*)     Albumin  2.5 (*)     Globulin 4.5 (*)  Albumin /Globulin Ratio 0.6 (*)     All other components within normal limits   URINALYSIS WITH REFLEX TO CULTURE - Abnormal; Notable for the following components:    Appearance BLOODY (*)     Protein, UA >300 (*)     Glucose, Ur >1000 (*)     Blood, Urine LARGE (*)     RBC, UA >100 (*)     BACTERIA, URINE 1+ (*)     All other components within normal limits   BRAIN NATRIURETIC PEPTIDE - Abnormal; Notable for the following components:    NT Pro-BNP 3,189 (*)     All other components within normal limits   LIPASE   MAGNESIUM        All other labs were within normal range or not returned as of this dictation.    EMERGENCY DEPARTMENT COURSE and DIFFERENTIAL DIAGNOSIS/MDM:   Vitals:    Vitals:    06/17/23 1400 06/17/23 1428 06/17/23 1440 06/17/23 1500   BP: (!) 204/103 (!) 191/98 (!) 180/98 (!) 204/118   Pulse: (!) 112 (!) 107 (!) 104 (!) 116   Resp: 14 15 16 19    Temp:       TempSrc:       SpO2:       Weight:       Height:               Medical Decision Making  DDx: Food poisoning, viral gastroenteritis, gastritis, gastroparesis    Given benign abdominal exam, low concern for appendicitis, cholecystitis, diverticulitis, colitis, pancreatitis, SBO, ileus    Plan:  - Labs: CBC, CMP, lipase, UA, BNP  - Medications: Morphine , droperidol     Reassessment: Patient's workup is notable for hyperglycemia 375 and elevated BNP, although she does not appear clinically fluid overloaded.  She reports resolution of her symptoms following the above  interventions.  She remains tachycardic and hypertensive, although per her past few charts, both of these are usually elevated.  Considered admission to the hospital for management of hypertension, tachycardia, dehydration, workup of elevated BNP, however patient reports that she is scheduled to see her primary care provider in 2 days.  She notes that her nausea has resolved and she has successfully drunk fluids.  She states that she would like to be discharged home, which is reasonable with the understanding that she return to the emergency department for any recurrent symptoms.  Will discharge patient at this time       Amount and/or Complexity of Data Reviewed  Independent Historian: caregiver  Labs: ordered.    Risk  Prescription drug management.  Parenteral controlled substances.            REASSESSMENT            CONSULTS:  None    PROCEDURES:  Unless otherwise noted below, none     Procedures      FINAL IMPRESSION      1. Nausea and vomiting, unspecified vomiting type          DISPOSITION/PLAN   DISPOSITION Decision To Discharge 06/17/2023 03:01:19 PM      PATIENT REFERRED TO:  Marlin Slider, APRN - NP  [address]  [place] [address]  [phone]          Cardiology -   [facility]  Multiple locations in the [place] area  Phone: [phone]  Schedule an appointment as soon as possible for a visit       Cardiology - [facility]  Multiple locations in the [place] area  Phone: [phone]  Schedule an appointment as soon as possible for a visit       Endocrinology - [place]  [facility]  Multiple locations across [facility]  Phone: [phone]  Schedule an appointment as soon as possible for a visit         DISCHARGE MEDICATIONS:  Discharge Medication List as of 06/17/2023  3:05 PM            (Please note that portions of this note were completed with a voice recognition program.  Efforts were made to edit the dictations but occasionally words are  mis-transcribed.)    Norleen Dollar, MD (electronically signed)  Emergency Attending Physician / Physician Assistant / Nurse Practitioner            Dollar Norleen, MD  06/23/23 [phone]

## 2023-06-17 NOTE — Consults (Signed)
 Session ID: [id number]  Language: Spanish  Interpreter ID: #[id number]  Interpreter Name: Joanne Gavel

## 2023-06-17 NOTE — ED Notes (Signed)
 Pt dc'd home, discussed the importance of her high blood pressure and high heart rate and diabetes to be followed up cardiology, endocrinology and her pcp. Pts family and patient states there understanding, Discussed the importance of coming back to the er if she had chest pain, shortness of breath.

## 2023-06-17 NOTE — Discharge Instructions (Addendum)
 You were seen in the emergency department for nausea and vomiting.  The results of your tests were notable for high blood sugar and an elevated BNP which might mean that you have too much fluid on your body.  Although an exact cause of your symptoms was not identified, the most likely cause is diabetic gastroparesis.  Please take any medications prescribed at this visit as instructed.  Please follow-up with your PCP to have your diabetes medications and a cardiologist for evaluation of your heart function adjusted or return to the emergency department if you experience a worsening of symptoms or any new symptoms that are concerning to you.

## 2023-07-19 ENCOUNTER — Inpatient Hospital Stay
Admit: 2023-07-19 | Discharge: 2023-07-19 | Disposition: A | Discharge status: Home / Self Care | Payer: MEDICAID | Attending: Emergency Medicine

## 2023-07-19 DIAGNOSIS — R1084 Generalized abdominal pain: Secondary | ICD-10-CM

## 2023-07-19 DIAGNOSIS — N179 Acute kidney failure, unspecified: Secondary | ICD-10-CM

## 2023-07-19 LAB — CBC WITH AUTO DIFFERENTIAL
Basophils %: 1 % (ref 0–1)
Basophils Absolute: 0.1 10*3/uL (ref 0.0–0.1)
Eosinophils %: 4 % (ref 0–7)
Eosinophils Absolute: 0.3 10*3/uL (ref 0.0–0.4)
Hematocrit: 27.2 % — ABNORMAL LOW (ref 35.0–47.0)
Hemoglobin: 9.3 g/dL — ABNORMAL LOW (ref 11.5–16.0)
Immature Granulocytes %: 0 % (ref 0–0.5)
Immature Granulocytes Absolute: 0 10*3/uL (ref 0.00–0.04)
Lymphocytes %: 20 % (ref 12–49)
Lymphocytes Absolute: 1.7 10*3/uL (ref 0.8–3.5)
MCH: 29.2 pg (ref 26.0–34.0)
MCHC: 34.2 g/dL (ref 30.0–36.5)
MCV: 85.3 FL (ref 80.0–99.0)
MPV: 9.1 FL (ref 8.9–12.9)
Monocytes %: 8 % (ref 5–13)
Monocytes Absolute: 0.7 10*3/uL (ref 0.0–1.0)
Neutrophils %: 67 % (ref 32–75)
Neutrophils Absolute: 5.7 10*3/uL (ref 1.8–8.0)
Nucleated RBCs: 0 /100{WBCs}
Platelets: 388 10*3/uL (ref 150–400)
RBC: 3.19 M/uL — ABNORMAL LOW (ref 3.80–5.20)
RDW: 13.2 % (ref 11.5–14.5)
WBC: 8.5 10*3/uL (ref 3.6–11.0)
nRBC: 0 10*3/uL (ref 0.00–0.01)

## 2023-07-19 LAB — URINALYSIS WITH MICROSCOPIC
Bilirubin, Urine: NEGATIVE
Glucose, Ur: 100 mg/dL — AB
Ketones, Urine: NEGATIVE mg/dL
Nitrite, Urine: NEGATIVE
Protein, UA: >300 mg/dL — AB
Specific Gravity, UA: 1.025 (ref 1.003–1.030)
Urobilinogen, Urine: 0.2 EU/dL (ref 0.2–1.0)
pH, Urine: 6 (ref 5.0–8.0)

## 2023-07-19 LAB — COMPREHENSIVE METABOLIC PANEL
ALT: 26 U/L (ref 12–78)
AST: 22 U/L (ref 15–37)
Albumin/Globulin Ratio: 0.6 — ABNORMAL LOW (ref 1.1–2.2)
Albumin: 2.1 g/dL — ABNORMAL LOW (ref 3.5–5.0)
Alk Phosphatase: 122 U/L — ABNORMAL HIGH (ref 45–117)
Anion Gap: 7 mmol/L (ref 2–12)
BUN/Creatinine Ratio: 12 (ref 12–20)
BUN: 28 mg/dL — ABNORMAL HIGH (ref 6–20)
CO2: 27 mmol/L (ref 21–32)
Calcium: 8.2 mg/dL — ABNORMAL LOW (ref 8.5–10.1)
Chloride: 103 mmol/L (ref 97–108)
Creatinine: 2.34 mg/dL — ABNORMAL HIGH (ref 0.55–1.02)
Est, Glom Filt Rate: 26 mL/min/{1.73_m2} — ABNORMAL LOW (ref >60)
Globulin: 3.7 g/dL (ref 2.0–4.0)
Glucose: 112 mg/dL — ABNORMAL HIGH (ref 65–100)
Potassium: 4 mmol/L (ref 3.5–5.1)
Sodium: 137 mmol/L (ref 136–145)
Total Bilirubin: 0.2 mg/dL (ref 0.2–1.0)
Total Protein: 5.8 g/dL — ABNORMAL LOW (ref 6.4–8.2)

## 2023-07-19 LAB — LIPASE: Lipase: 23 U/L (ref 13–75)

## 2023-07-19 LAB — POC PREGNANCY UR-QUAL: Preg Test, Ur: NEGATIVE

## 2023-07-19 MED ORDER — STERILE WATER FOR INJECTION (MIXTURES ONLY)
1 | INTRAMUSCULAR | Status: AC
Start: 2023-07-19 — End: 2023-07-19
  Administered 2023-07-19: 19:00:00 1000 mg via INTRAVENOUS

## 2023-07-19 MED ORDER — SODIUM CHLORIDE 0.9 % IV BOLUS
0.9 | Freq: Once | INTRAVENOUS | Status: AC
Start: 2023-07-19 — End: 2023-07-19
  Administered 2023-07-19: 18:00:00 1000 mL via INTRAVENOUS

## 2023-07-19 MED ORDER — CALCIUM CARBONATE ANTACID 500 MG PO CHEW
500 | ORAL | Status: AC
Start: 2023-07-19 — End: 2023-07-19
  Administered 2023-07-19: 20:00:00 1000 mg via ORAL

## 2023-07-19 MED ORDER — DROPERIDOL 2.5 MG/ML IJ SOLN
2.5 | Freq: Once | INTRAMUSCULAR | Status: AC
Start: 2023-07-19 — End: 2023-07-19
  Administered 2023-07-19: 16:00:00 0.625 mg via INTRAVENOUS

## 2023-07-19 MED ORDER — METOCLOPRAMIDE HCL 10 MG PO TABS
10 | ORAL_TABLET | Freq: Four times a day (QID) | ORAL | 0 refills | 28.00 days | Status: AC
Start: 2023-07-19 — End: 2023-07-26

## 2023-07-19 MED ORDER — LORAZEPAM 1 MG PO TABS
1 | Freq: Once | ORAL | Status: AC
Start: 2023-07-19 — End: 2023-07-19
  Administered 2023-07-19: 18:00:00 1 mg via ORAL

## 2023-07-19 MED ORDER — KETOROLAC TROMETHAMINE 30 MG/ML IJ SOLN
30 | Freq: Once | INTRAMUSCULAR | Status: AC
Start: 2023-07-19 — End: 2023-07-19
  Administered 2023-07-19: 16:00:00 15 mg via INTRAVENOUS

## 2023-07-19 MED ORDER — POTASSIUM BICARB-CITRIC ACID 20 MEQ PO TBEF
20 | Freq: Once | ORAL | Status: AC
Start: 2023-07-19 — End: 2023-07-19
  Administered 2023-07-19: 20:00:00 40 meq via ORAL

## 2023-07-19 MED ORDER — ONDANSETRON HCL 4 MG/2ML IJ SOLN
4 | Freq: Once | INTRAMUSCULAR | Status: AC
Start: 2023-07-19 — End: 2023-07-19
  Administered 2023-07-19: 16:00:00 4 mg via INTRAVENOUS

## 2023-07-19 MED ORDER — CEPHALEXIN 500 MG PO CAPS
500 | ORAL_CAPSULE | Freq: Two times a day (BID) | ORAL | 0 refills | Status: AC
Start: 2023-07-19 — End: 2023-07-26

## 2023-07-19 MED FILL — SODIUM CHLORIDE 0.9 % IV SOLN: 0.9 % | INTRAVENOUS | Qty: 1000

## 2023-07-19 MED FILL — DROPERIDOL 2.5 MG/ML IJ SOLN: 2.5 MG/ML | INTRAMUSCULAR | Qty: 2

## 2023-07-19 MED FILL — CAL-GEST ANTACID 500 MG PO CHEW: 500 MG | ORAL | Qty: 2

## 2023-07-19 MED FILL — KETOROLAC TROMETHAMINE 30 MG/ML IJ SOLN: 30 MG/ML | INTRAMUSCULAR | Qty: 1

## 2023-07-19 MED FILL — LORAZEPAM 1 MG PO TABS: 1 MG | ORAL | Qty: 1

## 2023-07-19 MED FILL — CEFTRIAXONE SODIUM 1 G IJ SOLR: 1 g | INTRAMUSCULAR | Qty: 1000

## 2023-07-19 MED FILL — EFFER-K 20 MEQ PO TBEF: 20 MEQ | ORAL | Qty: 2

## 2023-07-19 MED FILL — ONDANSETRON HCL 4 MG/2ML IJ SOLN: 4 MG/2ML | INTRAMUSCULAR | Qty: 2

## 2023-07-19 NOTE — ED Provider Notes (Signed)
[facility] EMERGENCY DEPT  EMERGENCY DEPARTMENT ENCOUNTER      Pt Name: Melinda [name]  MRN: [id number]  Birthdate Jan 22, 1981  Date of evaluation: 07/19/2023  Provider: Noreene Larsson, MD      HISTORY OF PRESENT ILLNESS      HPI  42 year old female with a past medical history of type 2 diabetes, hypertension, hyperlipidemia, and gastroparesis presenting to the emergency department due to abdominal pain nausea and vomiting.  Patient developed the symptoms yesterday.  She endorses generalized abdominal pain with associated nonbloody and nonbilious emesis.  She states she also has some back pain with it.  She mentions that she has had the symptoms multiple times and per chart review she has been seen in the emergency department has been admitted for flares of her gastroparesis.  Denies any fevers.  Endorses some infrequent loose stools.  Denies urinary symptoms.  No chest pain or shortness of      Nursing Notes were reviewed.    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 ENDOSCOPY    OTHER SURGICAL HISTORY Left     Rentia attachment    TUBAL LIGATION Bilateral     UPPER GASTROINTESTINAL ENDOSCOPY N/A 01/17/2023    ESOPHAGOGASTRODUODENOSCOPY performed by Glyn Ade, MD at [facility] ENDOSCOPY    UPPER GASTROINTESTINAL  ENDOSCOPY N/A 01/17/2023    ESOPHAGOGASTRODUODENOSCOPY BIOPSY performed by Glyn Ade, MD at [facility] ENDOSCOPY    UPPER GASTROINTESTINAL ENDOSCOPY N/A 01/18/2023    ESOPHAGOGASTRODUODENOSCOPY performed by Glyn Ade, MD at [facility] ENDOSCOPY    UPPER GASTROINTESTINAL ENDOSCOPY N/A 05/13/2023    ESOPHAGOGASTRODUODENOSCOPY performed by Leta Speller, MD at [facility] ENDOSCOPY    UPPER GASTROINTESTINAL ENDOSCOPY N/A 05/13/2023    ESOPHAGOGASTRODUODENOSCOPY BIOPSY performed by Leta Speller, MD at [facility] ENDOSCOPY    [place] ABSCESS DRAINAGE PERITONEAL  02/11/2023    [place] ABSCESS DRAINAGE PERITONEAL 02/11/2023 [facility] RAD [place]         CURRENT MEDICATIONS       Previous Medications    AMITRIPTYLINE (ELAVIL) 10 MG TABLET    Take 1 tablet by mouth nightly    AMLODIPINE (NORVASC) 10 MG TABLET    Take 1 tablet by mouth daily    DICYCLOMINE (BENTYL) 20 MG TABLET    Take 1 tablet by mouth every 6 hours as needed (abdominal cramping)    INSULIN 70-30 (NOVOLIN 70/30) (70-30) 100 UNIT PER ML INJECTION VIAL    Inject 15 Units into the skin 2 times daily    LISINOPRIL (PRINIVIL;ZESTRIL) 40 MG TABLET    Take 1 tablet by mouth daily    METOCLOPRAMIDE (  REGLAN) 10 MG TABLET    Take 1 tablet by mouth 4 times daily as needed (nausea)    METOPROLOL SUCCINATE (TOPROL XL) 50 MG EXTENDED RELEASE TABLET    Take 1 tablet by mouth daily    NALOXONE (NARCAN) 4 MG/0.1ML LIQD NASAL SPRAY    1 spray by Nasal route as needed for Opioid Reversal    ONDANSETRON (ZOFRAN-ODT) 4 MG DISINTEGRATING TABLET    Take 1 tablet by mouth every 8 hours as needed for Nausea or Vomiting    PANTOPRAZOLE (PROTONIX) 40 MG TABLET    Take 1 tablet by mouth 2 times daily (before meals)    POLYETHYLENE GLYCOL (GLYCOLAX) 17 GM/SCOOP POWDER    Take 17 g by mouth daily       ALLERGIES     Patient has no known allergies.    FAMILY HISTORY     No family history on file.       SOCIAL HISTORY       Social History     Socioeconomic History    Marital status: Married   Tobacco Use    Smoking status:  Never    Smokeless tobacco: Never   Vaping Use    Vaping status: Never Used   Substance and Sexual Activity    Alcohol use: Never    Drug use: Never     Social Determinants of Health     Food Insecurity: No Food Insecurity (05/28/2023)    Hunger Vital Sign     Worried About Running Out of Food in the Last Year: Never true     Ran Out of Food in the Last Year: Never true   Recent Concern: Food Insecurity - Food Insecurity Present (03/16/2023)    Hunger Vital Sign     Worried About Running Out of Food in the Last Year: Sometimes true     Ran Out of Food in the Last Year: Sometimes true   Transportation Needs: No Transportation Needs (05/28/2023)    PRAPARE - Therapist, art (Medical): No     Lack of Transportation (Non-Medical): No    Social Connections ([facility] HRSN)   Housing Stability: Low Risk  (05/28/2023)    Housing Stability Vital Sign     Unable to Pay for Housing in the Last Year: No     Number of Times Moved in the Last Year: 0     Homeless in the Last Year: No         PHYSICAL EXAM       ED Triage Vitals [07/19/23 1138]   BP Systolic BP Percentile Diastolic BP Percentile Temp Temp Source Pulse Respirations SpO2   (!) 156/82 -- -- 98 F (36.7 C) Oral (!) 109 18 100 %      Height Weight - Scale         1.499 m (4\' 11" ) 59 kg (130 lb)             Body mass index is 26.26 kg/m.    Physical Exam  Constitutional:       Comments: Sitting upright in no acute distress   Eyes:      General: No scleral icterus.     Extraocular Movements: Extraocular movements intact.   Cardiovascular:      Heart sounds: No murmur heard.     Comments: Mildly tachycardic.  Normal capillary refill  Pulmonary:      Effort: Pulmonary effort is normal. No respiratory distress.  Breath sounds: Normal breath sounds. No wheezing or rales.   Abdominal:      Comments: Soft and no distention.  Normal bowel sounds.  Patient endorses diffuse tenderness.  No rebound or guarding.  No CVA tenderness noted   Skin:     General:  Skin is warm and dry.   Neurological:      Comments: Awake and alert.  GCS 15             EMERGENCY DEPARTMENT COURSE and DIFFERENTIAL DIAGNOSIS/MDM:   Vitals:    Vitals:    07/19/23 1138   BP: (!) 156/82   Pulse: (!) 109   Resp: 18   Temp: 98 F (36.7 C)   TempSrc: Oral   SpO2: 100%   Weight: 59 kg (130 lb)   Height: 1.499 m (4\' 11" )         Medical Decision Making  Amount and/or Complexity of Data Reviewed  Labs: ordered. Decision-making details documented in ED Course.    Risk  OTC drugs.  Prescription drug management.      Patient presenting with the above chief complaint.  Mildly tachycardic but otherwise stable vital signs.  She appears uncomfortable on exam but has a nonsurgical abdomen with no real reproducible tenderness I can appreciate.  There is no distention.  Given similar symptoms in the past and exam I have a low suspicion for any acute intra-abdominal process that would necessitate imaging at this time.  Labs and symptomatic treatment have been ordered.  Disposition pending improvement in symptoms and labs.      REASSESSMENT     ED Course as of 07/20/23 0954   Tue Jul 19, 2023   1349 Symptoms have greatly improved.  No further nausea or pain.  Patient does endorse some anxiety so I ordered her some Ativan.  Her labs are notable for an AKI with a creatinine of 2.34 which is above her baseline.  Patient is insistent on being discharged but is agreeable to IV fluids and repeat BMP.  She feels comfortable going home should her labs show good improvement [MM]   1459 Patient's urinalysis also appears to show UTI.  It has been noted that UTIs typically exacerbate her gastroparesis will treat with ceftriaxone. [MM]   1501 Care signed out to Dr. Mathis Bud pending repeat BMP and reassessment [MM]   1545 Calcium(!!): 6.3  Repletion ordered [RS]   1545 Creatinine(!): 1.62 [RS]   1545 Potassium(!): 3.1  Repletion ordered [RS]   1619 If able to tolerate PO, appears stable for discharge [RS]      ED Course User  Index  [MM] Noreene Larsson, MD  [RS] Alice Reichert, MD         CONSULTS:  None    PROCEDURES:     Procedures          (Please note that portions of this note were completed with a voice recognition program.  Efforts were made to edit the dictations but occasionally words are mis-transcribed.)    Noreene Larsson, MD (electronically signed)  Emergency Attending Physician              Noreene Larsson, MD  07/20/23 [phone]

## 2023-07-19 NOTE — Discharge Instructions (Addendum)
Return with any new or worsening symptoms.  Use the nausea medication as directed and take the antibiotics as prescribed.  I have also provided you with multiple numbers to follow-up with gastroenterology

## 2023-07-19 NOTE — ED Notes (Signed)
Pt ambulatory to restroom with steady gait. Pt states she would still like to be DC. MD states he will not DC her until all lab work has resulted. AMA discussed with pt but pt does not seem interested in signing out at this time pt changed from gown into her clothes and remains off the monitor. PIV still in place.

## 2023-07-19 NOTE — ED Notes (Signed)
This RN in to answer call bell. Pt states her pain has now resolved and she would like to be discharged. MD made aware.

## 2023-07-19 NOTE — ED Triage Notes (Signed)
Patient co generalized abdominal pain, bilateral flank pain  and vomiting that began yesterday.

## 2023-07-19 NOTE — ED Notes (Incomplete)
ED SIGN OUT NOTE  Care assumed at [facility]. [facility] 3:35 PM EDT    Patient was signed out to me by Dr. Dionne Ano.     Patient is awaiting repeat chemistry after IVF.    BP (!) 156/82   Pulse (!) 109   Temp 98 F (36.7 C) (Oral)   Resp 18   Ht 1.499 m (4\' 11" )   Wt 59 kg (130 lb)   LMP 06/13/2023   SpO2 100%   BMI 26.26 kg/m     Labs Reviewed   URINALYSIS WITH MICROSCOPIC - Abnormal; Notable for the following components:       Result Value    Protein, UA >300 (*)     Glucose, Ur 100 (*)     Blood, Urine SMALL (*)     Leukocyte Esterase, Urine SMALL (*)     BACTERIA, URINE 3+ (*)     All other components within normal limits   COMPREHENSIVE METABOLIC PANEL - Abnormal; Notable for the following components:    Glucose 112 (*)     BUN 28 (*)     Creatinine 2.34 (*)     Est, Glom Filt Rate 26 (*)     Calcium 8.2 (*)     Alk Phosphatase 122 (*)     Total Protein 5.8 (*)     Albumin 2.1 (*)     Albumin/Globulin Ratio 0.6 (*)     All other components within normal limits   CBC WITH AUTO DIFFERENTIAL - Abnormal; Notable for the following components:    RBC 3.19 (*)     Hemoglobin 9.3 (*)     Hematocrit 27.2 (*)     All other components within normal limits   LIPASE   EXTRA TUBES HOLD   BASIC METABOLIC PANEL   POC PREGNANCY UR-QUAL   POC PREGNANCY UR-QUAL     No orders to display       ED Course as of 07/19/23 1535   Tue Jul 19, 2023   1349 Symptoms have greatly improved.  No further nausea or pain.  Patient does endorse some anxiety so I ordered her some Ativan.  Her labs are notable for an AKI with a creatinine of 2.34 which is above her baseline.  Patient is insistent on being discharged but is agreeable to IV fluids and repeat BMP.  She feels comfortable going home should her labs show good improvement [MM]   1459 Patient's urinalysis also appears to show UTI.  It has been noted that UTIs typically exacerbate her gastroparesis will treat with ceftriaxone. [MM]   1501 Care signed out to Dr. Mathis Bud pending  repeat BMP and reassessment [MM]      ED Course User Index  [MM] Noreene Larsson, MD       Diagnosis:   1. Generalized abdominal pain    2. Urinary tract infection without hematuria, site unspecified    3. AKI (acute kidney injury) (HCC)    4. Dehydration        Disposition:    07/19/2023 03:01:54 PM    Plan:   ***    Alice Reichert, MD

## 2023-07-19 NOTE — ED Notes (Signed)
This RN brought pt back to ED treatment area. Pt denies need for interpreter at this time.

## 2023-07-19 NOTE — Progress Notes (Signed)
Spiritual Health History and Assessment/Progress Note  [facility]    Initial Encounter,  ,  ,      Name: Melinda [name] MRN: [id number]    Age: 42 y.o.     Sex: female   Language: Spanish   Religion: Catholic   <principal problem not specified>     Date: 07/19/2023            Total Time Calculated: 20 min              Spiritual Assessment began in [facility] EMERGENCY DEPT            Encounter Overview/Reason: Initial Encounter  Service Provided For: Patient and family together    Faith, Belief, Meaning:   Patient identifies as spiritual, is connected with a faith tradition or spiritual practice, and has beliefs or practices that help with coping during difficult times  Family/Friends identify as spiritual      Importance and Influence:  Patient has spiritual/personal beliefs that influence decisions regarding their health  Family/Friends have spiritual/personal beliefs that influence decisions regarding the patient's health    Community:  Patient is connected with a spiritual community and feels well-supported. Support system includes: Spouse/Partner, Children, and Extended family  Family/Friends feel well-supported. Support system includes: Parent/s and Extended family    Assessment and Plan of Care:     Patient Interventions include: Facilitated expression of thoughts and feelings, Explored spiritual coping/struggle/distress, Affirmed coping skills/support systems, and Facilitated life review and/ or legacy  Family/Friends Interventions include: Facilitated expression of thoughts and feelings, Explored spiritual coping/struggle/distress, Affirmed coping skills/support systems, and Facilitated life review and/or legacy    Patient Plan of Care: Spiritual Care available upon further referral  Family/Friends Plan of Care: Spiritual Care available upon further referral    Narrative: Pt is Spanish speaking only; met Pt bedside as she has her 2 daughters present that speak [place]. Chaplain seen Pt before  in previous hospitalization at [facility] Chaplain greets Pt and family and shares role; Pt expressed she is doing fine, hoping that the IV's will do the trick to help her so she can leave. Pt and family expressed no needs at this time and expressed gratitude for the visit.     Electronically signed by Hale Bogus, [facility] on 07/19/2023 at 2:29 PM   For Chaplain support, please call Spiritual Health Team @ [phone]- PRAY [phone])

## 2023-07-19 NOTE — ED Notes (Signed)
 The patient was discharged home by provider in stable condition with family. The patient is alert and oriented, in no respiratory distress. The patient's diagnosis, condition and treatment were explained. The patient expressed understanding and denies any questions or concerns at this time. Patient leaves treatment area ambulatory with all personal belongings.

## 2023-07-23 LAB — BASIC METABOLIC PANEL
Anion Gap: 8 mmol/L (ref 2–12)
BUN/Creatinine Ratio: 14 (ref 12–20)
BUN: 23 mg/dL — ABNORMAL HIGH (ref 6–20)
CO2: 21 mmol/L (ref 21–32)
Calcium: 6.3 mg/dL — CL (ref 8.5–10.1)
Chloride: 113 mmol/L — ABNORMAL HIGH (ref 97–108)
Creatinine: 1.62 mg/dL — ABNORMAL HIGH (ref 0.55–1.02)
Est, Glom Filt Rate: 41 mL/min/{1.73_m2} — ABNORMAL LOW (ref >60)
Glucose: 72 mg/dL (ref 65–100)
Potassium: 3.1 mmol/L — ABNORMAL LOW (ref 3.5–5.1)
Sodium: 142 mmol/L (ref 136–145)

## 2023-08-31 ENCOUNTER — Inpatient Hospital Stay
Admit: 2023-08-31 | Discharge: 2023-08-31 | Disposition: A | Discharge status: Home / Self Care | Payer: MEDICAID | Admitting: Emergency Medicine

## 2023-08-31 DIAGNOSIS — I160 Hypertensive urgency: Secondary | ICD-10-CM

## 2023-08-31 LAB — URINALYSIS WITH MICROSCOPIC
Bilirubin, Urine: NEGATIVE
Glucose, Ur: NEGATIVE mg/dL
Ketones, Urine: NEGATIVE mg/dL
Nitrite, Urine: NEGATIVE
Protein, UA: 300 mg/dL — AB
Specific Gravity, UA: 1.015 (ref 1.003–1.030)
Urobilinogen, Urine: 0.2 U/dL (ref 0.2–1.0)
WBC, UA: >100 /[HPF] — ABNORMAL HIGH (ref 0–4)
pH, Urine: 7 (ref 5.0–8.0)

## 2023-08-31 LAB — CBC WITH AUTO DIFFERENTIAL
Basophils %: 1 % (ref 0–1)
Basophils Absolute: 0 10*3/uL (ref 0.0–0.1)
Eosinophils %: 5 % (ref 0–7)
Eosinophils Absolute: 0.3 10*3/uL (ref 0.0–0.4)
Hematocrit: 34.3 % — ABNORMAL LOW (ref 35.0–47.0)
Hemoglobin: 11.2 g/dL — ABNORMAL LOW (ref 11.5–16.0)
Immature Granulocytes %: 1 % — ABNORMAL HIGH (ref 0.0–0.5)
Immature Granulocytes Absolute: 0 10*3/uL (ref 0.00–0.04)
Lymphocytes %: 29 % (ref 12–49)
Lymphocytes Absolute: 1.9 10*3/uL (ref 0.8–3.5)
MCH: 29.1 pg (ref 26.0–34.0)
MCHC: 32.7 g/dL (ref 30.0–36.5)
MCV: 89.1 fL (ref 80.0–99.0)
MPV: 8.4 fL — ABNORMAL LOW (ref 8.9–12.9)
Monocytes %: 7 % (ref 5–13)
Monocytes Absolute: 0.4 10*3/uL (ref 0.0–1.0)
Neutrophils %: 57 % (ref 32–75)
Neutrophils Absolute: 3.9 10*3/uL (ref 1.8–8.0)
Nucleated RBCs: 0 /100{WBCs}
Platelets: 514 10*3/uL — ABNORMAL HIGH (ref 150–400)
RBC: 3.85 M/uL (ref 3.80–5.20)
RDW: 12.7 % (ref 11.5–14.5)
WBC: 6.5 10*3/uL (ref 3.6–11.0)
nRBC: 0 10*3/uL (ref 0.00–0.01)

## 2023-08-31 LAB — TROPONIN: Troponin, High Sensitivity: 13 ng/L (ref 0–51)

## 2023-08-31 LAB — EKG 12-LEAD
Atrial Rate: 99 {beats}/min
Diagnosis: NORMAL
P Axis: 69 degrees
P-R Interval: 140 ms
Q-T Interval: 378 ms
QRS Duration: 82 ms
QTc Calculation (Bazett): 485 ms
R Axis: -84 degrees
T Axis: 66 degrees
Ventricular Rate: 99 {beats}/min

## 2023-08-31 LAB — COMPREHENSIVE METABOLIC PANEL
ALT: 31 U/L (ref 12–78)
AST: 20 U/L (ref 15–37)
Albumin/Globulin Ratio: 0.5 — ABNORMAL LOW (ref 1.1–2.2)
Albumin: 2.3 g/dL — ABNORMAL LOW (ref 3.5–5.0)
Alk Phosphatase: 119 U/L — ABNORMAL HIGH (ref 45–117)
Anion Gap: 5 mmol/L (ref 2–12)
BUN/Creatinine Ratio: 20 (ref 12–20)
BUN: 36 mg/dL — ABNORMAL HIGH (ref 6–20)
CO2: 26 mmol/L (ref 21–32)
Calcium: 9.3 mg/dL (ref 8.5–10.1)
Chloride: 109 mmol/L — ABNORMAL HIGH (ref 97–108)
Creatinine: 1.77 mg/dL — ABNORMAL HIGH (ref 0.55–1.02)
Est, Glom Filt Rate: 37 mL/min/{1.73_m2} — ABNORMAL LOW (ref >60)
Globulin: 4.4 g/dL — ABNORMAL HIGH (ref 2.0–4.0)
Glucose: 123 mg/dL — ABNORMAL HIGH (ref 65–100)
Potassium: 4.3 mmol/L (ref 3.5–5.1)
Sodium: 140 mmol/L (ref 136–145)
Total Bilirubin: 0.5 mg/dL (ref 0.2–1.0)
Total Protein: 6.7 g/dL (ref 6.4–8.2)

## 2023-08-31 LAB — EXTRA TUBES HOLD

## 2023-08-31 LAB — POC PREGNANCY UR-QUAL: Preg Test, Ur: NEGATIVE

## 2023-08-31 MED ORDER — SODIUM CHLORIDE 0.9 % IV BOLUS
0.9 | Freq: Once | INTRAVENOUS | Status: AC
Start: 2023-08-31 — End: 2023-08-31
  Administered 2023-08-31: 21:00:00 1000 mL via INTRAVENOUS

## 2023-08-31 MED ORDER — CEPHALEXIN 500 MG PO CAPS
500 MG | ORAL_CAPSULE | Freq: Three times a day (TID) | ORAL | 0 refills | Status: DC
Start: 2023-08-31 — End: 2023-09-14

## 2023-08-31 MED ORDER — MECLIZINE HCL 25 MG PO TABS
25 | Freq: Once | ORAL | Status: AC
Start: 2023-08-31 — End: 2023-08-31
  Administered 2023-08-31: 21:00:00 25 mg via ORAL

## 2023-08-31 MED ORDER — CEPHALEXIN 250 MG PO CAPS
250 | ORAL | Status: AC
Start: 2023-08-31 — End: 2023-08-31
  Administered 2023-08-31: 21:00:00 500 mg via ORAL

## 2023-08-31 MED ORDER — MECLIZINE HCL 25 MG PO TABS
25 MG | ORAL_TABLET | Freq: Three times a day (TID) | ORAL | 0 refills | Status: DC | PRN
Start: 2023-08-31 — End: 2023-09-14

## 2023-08-31 MED ORDER — HYDRALAZINE HCL 20 MG/ML IJ SOLN
20 | Freq: Once | INTRAMUSCULAR | Status: AC
Start: 2023-08-31 — End: 2023-08-31
  Administered 2023-08-31: 21:00:00 10 mg via INTRAVENOUS

## 2023-08-31 MED ORDER — DIAZEPAM 5 MG/ML IJ SOLN
5 | Freq: Once | INTRAMUSCULAR | Status: AC
Start: 2023-08-31 — End: 2023-08-31
  Administered 2023-08-31: 19:00:00 2.5 mg via INTRAVENOUS

## 2023-08-31 MED ORDER — IOPAMIDOL 76 % IV SOLN
76 | Freq: Once | INTRAVENOUS | Status: AC | PRN
Start: 2023-08-31 — End: 2023-08-31
  Administered 2023-08-31: 20:00:00 100 mL via INTRAVENOUS

## 2023-08-31 MED FILL — MECLIZINE HCL 25 MG PO TABS: 25 MG | ORAL | Qty: 1

## 2023-08-31 MED FILL — CEPHALEXIN 250 MG PO CAPS: 250 MG | ORAL | Qty: 2

## 2023-08-31 MED FILL — HYDRALAZINE HCL 20 MG/ML IJ SOLN: 20 MG/ML | INTRAMUSCULAR | Qty: 1

## 2023-08-31 MED FILL — ISOVUE-370 76 % IV SOLN: 76 % | INTRAVENOUS | Qty: 100

## 2023-08-31 MED FILL — DIAZEPAM 5 MG/ML IJ SOLN: 5 MG/ML | INTRAMUSCULAR | Qty: 2

## 2023-08-31 MED FILL — SODIUM CHLORIDE 0.9 % IV SOLN: 0.9 % | INTRAVENOUS | Qty: 1000

## 2023-08-31 NOTE — ED Triage Notes (Signed)
 Spanish translator used in triage.     Pt was seen today at a clinic in [place]. Pt initially went to the clinical for dizziness, right blurred vision, nausea, headache and vomiting.     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

## 2023-08-31 NOTE — Consults (Signed)
 Session ID: [id number]  Language: Spanish  Interpreter ID: #[id number]  Interpreter Name: Domingo Cocking

## 2023-08-31 NOTE — Consults (Signed)
 Session ID: [id number]  Language: Spanish  Interpreter ID: #[id number]  Interpreter Name: Regan Rakers

## 2023-08-31 NOTE — Consults (Signed)
 Session ID: [id number]  Language: Spanish  Interpreter ID: #[id number]  Interpreter Name: Gershon Cull

## 2023-08-31 NOTE — ED Provider Notes (Signed)
 [facility] EMERGENCY DEPT  EMERGENCY DEPARTMENT ENCOUNTER      Patient Name: Melinda [name]  MRN: [id number]  Birthdate [birth year]  Date of Evaluation: 08/31/2023  Physician: Harlen Labs, MD    CHIEF COMPLAINT       Chief Complaint   Patient presents with

## 2023-08-31 NOTE — Discharge Instructions (Signed)
 Thank you for allowing [place] to provide you with medical care today.  We realize that you have many choices for your emergency care needs.  We thank you for choosing [facility].  Please choose [place] in the future for any continued health care needs.     [facility]

## 2023-08-31 NOTE — ED Notes (Signed)
 Patient reports she had dizziness and blurry vision on Monday and those symptoms have been intermittent since then but worsened today. Dr. Sena Slate notified.

## 2023-09-02 ENCOUNTER — Inpatient Hospital Stay: Admit: 2023-09-02 | Payer: MEDICAID | Primary: Adult Health

## 2023-09-02 LAB — CBC WITH AUTO DIFFERENTIAL
Basophils %: 1 % (ref 0–1)
Basophils Absolute: 0.1 10*3/uL (ref 0.0–0.1)
Eosinophils %: 4 % (ref 0–7)
Eosinophils Absolute: 0.3 10*3/uL (ref 0.0–0.4)
Hematocrit: 36.5 % (ref 35.0–47.0)
Hemoglobin: 11.2 g/dL — ABNORMAL LOW (ref 11.5–16.0)
Immature Granulocytes Absolute: 0 10*3/uL (ref 0.00–0.04)
Lymphocytes %: 22 % (ref 12–49)
MCH: 28.5 pg (ref 26.0–34.0)
MCHC: 30.7 g/dL (ref 30.0–36.5)
MCV: 92.9 fL (ref 80.0–99.0)
MPV: 9.2 fL (ref 8.9–12.9)
Monocytes %: 5 % (ref 5–13)
Monocytes Absolute: 0.4 10*3/uL (ref 0.0–1.0)
Neutrophils %: 68 % (ref 32–75)
Neutrophils Absolute: 5.3 10*3/uL (ref 1.8–8.0)
Nucleated RBCs: 0 /100{WBCs}
Platelets: 619 10*3/uL — ABNORMAL HIGH (ref 150–400)
RBC: 3.93 M/uL (ref 3.80–5.20)
RDW: 13.2 % (ref 11.5–14.5)
WBC: 7.8 10*3/uL (ref 3.6–11.0)
nRBC: 0 10*3/uL (ref 0.00–0.01)

## 2023-09-02 LAB — MICROALBUMIN / CREATININE URINE RATIO
Creatinine, Ur: 67.7 mg/dL
Microalb, Ur: 544 mg/dL
Microalb/Creat Ratio: 8035 mg/g — ABNORMAL HIGH (ref 0–30)

## 2023-09-03 LAB — COMPREHENSIVE METABOLIC PANEL
ALT: 43 U/L (ref 12–78)
AST: 25 U/L (ref 15–37)
Albumin/Globulin Ratio: 0.6 — ABNORMAL LOW (ref 1.1–2.2)
Albumin: 2.5 g/dL — ABNORMAL LOW (ref 3.5–5.0)
Alk Phosphatase: 144 U/L — ABNORMAL HIGH (ref 45–117)
Anion Gap: 4 mmol/L (ref 2–12)
BUN/Creatinine Ratio: 17 (ref 12–20)
BUN: 27 mg/dL — ABNORMAL HIGH (ref 6–20)
CO2: 24 mmol/L (ref 21–32)
Calcium: 9.5 mg/dL (ref 8.5–10.1)
Chloride: 114 mmol/L — ABNORMAL HIGH (ref 97–108)
Creatinine: 1.56 mg/dL — ABNORMAL HIGH (ref 0.55–1.02)
Est, Glom Filt Rate: 43 mL/min/{1.73_m2} — ABNORMAL LOW (ref >60)
Globulin: 3.9 g/dL (ref 2.0–4.0)
Glucose: 118 mg/dL — ABNORMAL HIGH (ref 65–100)
Potassium: 4.9 mmol/L (ref 3.5–5.1)
Sodium: 142 mmol/L (ref 136–145)
Total Bilirubin: 0.1 mg/dL — ABNORMAL LOW (ref 0.2–1.0)
Total Protein: 6.4 g/dL (ref 6.4–8.2)

## 2023-09-03 LAB — IRON AND TIBC
Iron % Saturation: 16 % — ABNORMAL LOW (ref 20–50)
Iron: 50 ug/dL (ref 35–150)
TIBC: 308 ug/dL (ref 250–450)

## 2023-09-03 LAB — HEMOGLOBIN A1C
Estimated Avg Glucose: 183 mg/dL
Hemoglobin A1C: 8 % — ABNORMAL HIGH (ref 4.0–5.6)

## 2023-09-03 LAB — TSH: TSH, 3rd Generation: 1.42 u[IU]/mL (ref 0.36–3.74)

## 2023-09-03 LAB — LIPID PANEL
Chol/HDL Ratio: 4.4 (ref 0.0–5.0)
Cholesterol, Total: 341 mg/dL — ABNORMAL HIGH (ref <200)
HDL: 78 mg/dL
LDL Cholesterol: 193.8 mg/dL — ABNORMAL HIGH (ref 0–100)
Triglycerides: 346 mg/dL — ABNORMAL HIGH (ref <150)
VLDL Cholesterol Calculated: 69.2 mg/dL

## 2023-09-03 LAB — T4, FREE: T4 Free: 1 ng/dL (ref 0.8–1.5)

## 2023-09-06 ENCOUNTER — Emergency Department: Admit: 2023-09-06 | Payer: MEDICAID | Primary: Adult Health

## 2023-09-06 ENCOUNTER — Inpatient Hospital Stay: Admit: 2023-09-06 | Discharge: 2023-09-14 | Disposition: A | Discharge status: Home / Self Care | Payer: MEDICAID

## 2023-09-06 DIAGNOSIS — R1084 Generalized abdominal pain: Secondary | ICD-10-CM

## 2023-09-06 DIAGNOSIS — E1143 Type 2 diabetes mellitus with diabetic autonomic (poly)neuropathy: Principal | ICD-10-CM

## 2023-09-06 LAB — URINALYSIS WITH REFLEX TO CULTURE
BACTERIA, URINE: NEGATIVE /[HPF]
Bilirubin, Urine: NEGATIVE
Glucose, Ur: NEGATIVE mg/dL
Nitrite, Urine: NEGATIVE
Protein, UA: 300 mg/dL — AB
Specific Gravity, UA: 1.024 (ref 1.003–1.030)
Urobilinogen, Urine: 0.2 U/dL (ref 0.2–1.0)
pH, Urine: 6.5 (ref 5.0–8.0)

## 2023-09-06 LAB — TROPONIN: Troponin, High Sensitivity: 24 ng/L (ref 0–51)

## 2023-09-06 LAB — CBC WITH AUTO DIFFERENTIAL
Basophils %: 1 % (ref 0–1)
Basophils Absolute: 0.1 10*3/uL (ref 0.0–0.1)
Eosinophils %: 3 % (ref 0–7)
Eosinophils Absolute: 0.3 10*3/uL (ref 0.0–0.4)
Hematocrit: 33.6 % — ABNORMAL LOW (ref 35.0–47.0)
Hemoglobin: 11.2 g/dL — ABNORMAL LOW (ref 11.5–16.0)
Immature Granulocytes %: 0 % (ref 0.0–0.5)
Immature Granulocytes Absolute: 0 10*3/uL (ref 0.00–0.04)
Lymphocytes %: 22 % (ref 12–49)
Lymphocytes Absolute: 2.1 10*3/uL (ref 0.8–3.5)
MCH: 28.6 pg (ref 26.0–34.0)
MCHC: 33.3 g/dL (ref 30.0–36.5)
MCV: 85.7 fL (ref 80.0–99.0)
MPV: 8.5 fL — ABNORMAL LOW (ref 8.9–12.9)
Monocytes %: 6 % (ref 5–13)
Monocytes Absolute: 0.6 10*3/uL (ref 0.0–1.0)
Neutrophils %: 68 % (ref 32–75)
Neutrophils Absolute: 6.5 10*3/uL (ref 1.8–8.0)
Nucleated RBCs: 0 /100{WBCs}
Platelets: 577 10*3/uL — ABNORMAL HIGH (ref 150–400)
RBC: 3.92 M/uL (ref 3.80–5.20)
RDW: 12.7 % (ref 11.5–14.5)
WBC: 9.6 10*3/uL (ref 3.6–11.0)
nRBC: 0 10*3/uL (ref 0.00–0.01)

## 2023-09-06 LAB — COMPREHENSIVE METABOLIC PANEL
ALT: 43 U/L (ref 12–78)
AST: 27 U/L (ref 15–37)
Albumin/Globulin Ratio: 0.6 — ABNORMAL LOW (ref 1.1–2.2)
Albumin: 2.5 g/dL — ABNORMAL LOW (ref 3.5–5.0)
Alk Phosphatase: 125 U/L — ABNORMAL HIGH (ref 45–117)
Anion Gap: 7 mmol/L (ref 2–12)
BUN/Creatinine Ratio: 16 (ref 12–20)
BUN: 28 mg/dL — ABNORMAL HIGH (ref 6–20)
CO2: 25 mmol/L (ref 21–32)
Calcium: 8.9 mg/dL (ref 8.5–10.1)
Chloride: 105 mmol/L (ref 97–108)
Creatinine: 1.74 mg/dL — ABNORMAL HIGH (ref 0.55–1.02)
Est, Glom Filt Rate: 37 mL/min/{1.73_m2} — ABNORMAL LOW (ref >60)
Globulin: 4.4 g/dL — ABNORMAL HIGH (ref 2.0–4.0)
Glucose: 154 mg/dL — ABNORMAL HIGH (ref 65–100)
Potassium: 4.3 mmol/L (ref 3.5–5.1)
Sodium: 137 mmol/L (ref 136–145)
Total Bilirubin: 0.5 mg/dL (ref 0.2–1.0)
Total Protein: 6.9 g/dL (ref 6.4–8.2)

## 2023-09-06 LAB — LACTIC ACID: Lactic Acid, Plasma: 0.4 MMOL/L (ref 0.4–2.0)

## 2023-09-06 LAB — LIPASE: Lipase: 23 U/L (ref 13–75)

## 2023-09-06 LAB — MAGNESIUM: Magnesium: 2.1 mg/dL (ref 1.6–2.4)

## 2023-09-06 MED ORDER — MAGNESIUM SULFATE 2000 MG/50 ML IVPB PREMIX
2 GM/50ML | INTRAVENOUS | Status: DC | PRN
Start: 2023-09-06 — End: 2023-09-14

## 2023-09-06 MED ORDER — ENOXAPARIN SODIUM 40 MG/0.4ML IJ SOSY
40 | Freq: Every day | INTRAMUSCULAR | Status: DC
Start: 2023-09-06 — End: 2023-09-07
  Administered 2023-09-07: 01:00:00 40 mg via SUBCUTANEOUS

## 2023-09-06 MED ORDER — INSULIN NPH ISOPHANE & REGULAR (70-30) 100 UNIT/ML SC SUPN
Freq: Two times a day (BID) | SUBCUTANEOUS | Status: DC
Start: 2023-09-06 — End: 2023-09-06

## 2023-09-06 MED ORDER — NORMAL SALINE FLUSH 0.9 % IV SOLN
0.9 % | INTRAVENOUS | Status: AC | PRN
Start: 2023-09-06 — End: 2023-09-14

## 2023-09-06 MED ORDER — POTASSIUM BICARB-CITRIC ACID 20 MEQ PO TBEF
20 MEQ | ORAL | Status: AC | PRN
Start: 2023-09-06 — End: 2023-09-14

## 2023-09-06 MED ORDER — SODIUM CHLORIDE 0.9 % IV SOLN
0.9 | INTRAVENOUS | Status: AC
Start: 2023-09-06 — End: 2023-09-07
  Administered 2023-09-07 (×2): via INTRAVENOUS

## 2023-09-06 MED ORDER — LISINOPRIL 20 MG PO TABS
20 MG | Freq: Every day | ORAL | Status: AC
Start: 2023-09-06 — End: 2023-09-14
  Administered 2023-09-07 – 2023-09-14 (×8): 40 mg via ORAL

## 2023-09-06 MED ORDER — ONDANSETRON HCL 4 MG/2ML IJ SOLN
4 | Freq: Four times a day (QID) | INTRAMUSCULAR | Status: DC | PRN
Start: 2023-09-06 — End: 2023-09-08
  Administered 2023-09-07 – 2023-09-08 (×4): 4 mg via INTRAVENOUS

## 2023-09-06 MED ORDER — HYDROMORPHONE HCL PF 1 MG/ML IJ SOLN
1 | INTRAMUSCULAR | Status: AC
Start: 2023-09-06 — End: 2023-09-06
  Administered 2023-09-06: 20:00:00 0.5 mg via INTRAVENOUS

## 2023-09-06 MED ORDER — DEXTROSE 10 % IV BOLUS
INTRAVENOUS | Status: DC | PRN
Start: 2023-09-06 — End: 2023-09-14

## 2023-09-06 MED ORDER — MORPHINE SULFATE (PF) 4 MG/ML IJ SOLN
4 | INTRAMUSCULAR | Status: AC
Start: 2023-09-06 — End: 2023-09-06
  Administered 2023-09-06: 20:00:00 4 mg via INTRAVENOUS

## 2023-09-06 MED ORDER — GLUCAGON (RDNA) 1 MG IJ KIT
1 MG | INTRAMUSCULAR | Status: AC | PRN
Start: 2023-09-06 — End: 2023-09-14

## 2023-09-06 MED ORDER — METOCLOPRAMIDE HCL 5 MG/ML IJ SOLN
5 MG/ML | Freq: Four times a day (QID) | INTRAMUSCULAR | Status: DC
Start: 2023-09-06 — End: 2023-09-14
  Administered 2023-09-07 – 2023-09-14 (×32): 10 mg via INTRAVENOUS

## 2023-09-06 MED ORDER — INSULIN LISPRO 100 UNIT/ML IJ SOLN
100 UNIT/ML | Freq: Four times a day (QID) | INTRAMUSCULAR | Status: DC
Start: 2023-09-06 — End: 2023-09-14
  Administered 2023-09-07: 03:00:00 2 [IU] via SUBCUTANEOUS
  Administered 2023-09-08 – 2023-09-12 (×4): 1 [IU] via SUBCUTANEOUS

## 2023-09-06 MED ORDER — HYDRALAZINE HCL 20 MG/ML IJ SOLN
20 | INTRAMUSCULAR | Status: AC
Start: 2023-09-06 — End: 2023-09-06
  Administered 2023-09-06: 23:00:00 10 mg via INTRAVENOUS

## 2023-09-06 MED ORDER — ONDANSETRON 4 MG PO TBDP
4 | Freq: Three times a day (TID) | ORAL | Status: DC | PRN
Start: 2023-09-06 — End: 2023-09-08

## 2023-09-06 MED ORDER — AMLODIPINE BESYLATE 5 MG PO TABS
5 MG | Freq: Every day | ORAL | Status: AC
Start: 2023-09-06 — End: 2023-09-14
  Administered 2023-09-07 – 2023-09-14 (×8): 10 mg via ORAL

## 2023-09-06 MED ORDER — NORMAL SALINE FLUSH 0.9 % IV SOLN
0.9 % | Freq: Two times a day (BID) | INTRAVENOUS | Status: DC
Start: 2023-09-06 — End: 2023-09-14
  Administered 2023-09-07 – 2023-09-14 (×13): 10 mL via INTRAVENOUS

## 2023-09-06 MED ORDER — DEXTROSE 10 % IV SOLN
10 % | INTRAVENOUS | Status: AC | PRN
Start: 2023-09-06 — End: 2023-09-14

## 2023-09-06 MED ORDER — ACETAMINOPHEN 325 MG PO TABS
325 | Freq: Four times a day (QID) | ORAL | Status: DC | PRN
Start: 2023-09-06 — End: 2023-09-14

## 2023-09-06 MED ORDER — ONDANSETRON HCL 4 MG/2ML IJ SOLN
4 | Freq: Once | INTRAMUSCULAR | Status: AC
Start: 2023-09-06 — End: 2023-09-06
  Administered 2023-09-06: 20:00:00 4 mg via INTRAVENOUS

## 2023-09-06 MED ORDER — SODIUM CHLORIDE 0.9 % IV SOLN
0.9 % | INTRAVENOUS | Status: DC
Start: 2023-09-06 — End: 2023-09-07
  Administered 2023-09-06: 5 mg/h via INTRAVENOUS

## 2023-09-06 MED ORDER — DEXTROSE 10 % IV BOLUS
INTRAVENOUS | Status: AC | PRN
Start: 2023-09-06 — End: 2023-09-14

## 2023-09-06 MED ORDER — HYDROMORPHONE HCL PF 1 MG/ML IJ SOLN
1 MG/ML | INTRAMUSCULAR | Status: AC | PRN
Start: 2023-09-06 — End: 2023-09-14
  Administered 2023-09-07 – 2023-09-14 (×31): 1 mg via INTRAVENOUS

## 2023-09-06 MED ORDER — STERILE WATER FOR INJECTION (MIXTURES ONLY)
1 g | INTRAMUSCULAR | Status: AC
Start: 2023-09-06 — End: 2023-09-10
  Administered 2023-09-07 – 2023-09-10 (×5): 1000 mg via INTRAVENOUS

## 2023-09-06 MED ORDER — POTASSIUM CHLORIDE 10 MEQ/100ML IV SOLN
10100 MEQ/0ML | INTRAVENOUS | Status: AC | PRN
Start: 2023-09-06 — End: 2023-09-14

## 2023-09-06 MED ORDER — SODIUM CHLORIDE 0.9 % IV BOLUS
0.9 | Freq: Once | INTRAVENOUS | Status: AC
Start: 2023-09-06 — End: 2023-09-06
  Administered 2023-09-06: 20:00:00 1000 mL via INTRAVENOUS

## 2023-09-06 MED ORDER — SODIUM CHLORIDE 0.9 % IV SOLN
0.9 % | INTRAVENOUS | Status: DC | PRN
Start: 2023-09-06 — End: 2023-09-14

## 2023-09-06 MED ORDER — POTASSIUM CHLORIDE ER 10 MEQ PO TBCR
10 MEQ | ORAL | Status: AC | PRN
Start: 2023-09-06 — End: 2023-09-14

## 2023-09-06 MED ORDER — IOPAMIDOL 76 % IV SOLN
76 | Freq: Once | INTRAVENOUS | Status: AC | PRN
Start: 2023-09-06 — End: 2023-09-06
  Administered 2023-09-06: 21:00:00 100 mL via INTRAVENOUS

## 2023-09-06 MED ORDER — ONDANSETRON 4 MG PO TBDP
4 | ORAL_TABLET | Freq: Three times a day (TID) | ORAL | 0 refills | Status: DC | PRN
Start: 2023-09-06 — End: 2023-09-14

## 2023-09-06 MED ORDER — GLUCOSE 4 G PO CHEW
4 g | ORAL | Status: AC | PRN
Start: 2023-09-06 — End: 2023-09-14

## 2023-09-06 MED ORDER — METOPROLOL SUCCINATE ER 50 MG PO TB24
50 MG | Freq: Every day | ORAL | Status: DC
Start: 2023-09-06 — End: 2023-09-13
  Administered 2023-09-07 – 2023-09-12 (×6): 50 mg via ORAL

## 2023-09-06 MED ORDER — DICYCLOMINE HCL 20 MG PO TABS
20 MG | Freq: Four times a day (QID) | ORAL | Status: AC | PRN
Start: 2023-09-06 — End: 2023-09-14

## 2023-09-06 MED ORDER — POLYETHYLENE GLYCOL 3350 17 G PO PACK
17 g | Freq: Every day | ORAL | Status: AC | PRN
Start: 2023-09-06 — End: 2023-09-14

## 2023-09-06 MED ORDER — HYDROMORPHONE HCL PF 1 MG/ML IJ SOLN
1 | Freq: Once | INTRAMUSCULAR | Status: AC
Start: 2023-09-06 — End: 2023-09-06
  Administered 2023-09-06: 23:00:00 0.5 mg via INTRAVENOUS

## 2023-09-06 MED ORDER — PANTOPRAZOLE SODIUM 40 MG PO TBEC
40 MG | Freq: Two times a day (BID) | ORAL | Status: AC
Start: 2023-09-06 — End: 2023-09-14
  Administered 2023-09-08 – 2023-09-14 (×12): 40 mg via ORAL

## 2023-09-06 MED ORDER — MECLIZINE HCL 25 MG PO TABS
25 MG | Freq: Three times a day (TID) | ORAL | Status: AC | PRN
Start: 2023-09-06 — End: 2023-09-14

## 2023-09-06 MED ORDER — DICYCLOMINE HCL 10 MG PO CAPS
10 | ORAL_CAPSULE | ORAL | 1 refills | Status: DC | PRN
Start: 2023-09-06 — End: 2023-09-14

## 2023-09-06 MED ORDER — ACETAMINOPHEN 650 MG RE SUPP
650 | Freq: Four times a day (QID) | RECTAL | Status: DC | PRN
Start: 2023-09-06 — End: 2023-09-14

## 2023-09-06 MED FILL — HYDROMORPHONE HCL 1 MG/ML IJ SOLN: 1 MG/ML | INTRAMUSCULAR | Qty: 1

## 2023-09-06 MED FILL — MORPHINE SULFATE 4 MG/ML IJ SOLN: 4 mg/mL | INTRAMUSCULAR | Qty: 1

## 2023-09-06 MED FILL — BD POSIFLUSH 0.9 % IV SOLN: 0.9 % | INTRAVENOUS | Qty: 40

## 2023-09-06 MED FILL — ISOVUE-370 76 % IV SOLN: 76 % | INTRAVENOUS | Qty: 100

## 2023-09-06 MED FILL — DEXTROSE 10 % IV SOLN: 10 % | INTRAVENOUS | Qty: 500

## 2023-09-06 MED FILL — SODIUM CHLORIDE 0.9 % IV SOLN: 0.9 % | INTRAVENOUS | Qty: 250

## 2023-09-06 MED FILL — ONDANSETRON HCL 4 MG/2ML IJ SOLN: 4 MG/2ML | INTRAMUSCULAR | Qty: 2

## 2023-09-06 MED FILL — HYDRALAZINE HCL 20 MG/ML IJ SOLN: 20 MG/ML | INTRAMUSCULAR | Qty: 1

## 2023-09-06 MED FILL — SODIUM CHLORIDE 0.9 % IV SOLN: 0.9 % | INTRAVENOUS | Qty: 1000

## 2023-09-06 NOTE — Consults (Signed)
 Session ID: [id number]  Language: Spanish  Interpreter ID: #[id number]  Interpreter Name: Jari Favre

## 2023-09-06 NOTE — ED Notes (Signed)
 Report to RN to [facility] bed 334. Spoke with Dr Thedore Mins and will make sure stepdown order is in.

## 2023-09-06 NOTE — ED Provider Notes (Signed)
 [facility] EMERGENCY DEPT  EMERGENCY DEPARTMENT ENCOUNTER      Pt Name: Melinda [name]  MRN: [id number]  Birthdate 16-Feb-1981  Date of evaluation: 09/06/2023  Provider: Edsel Petrin, DO    CHIEF COMPLAINT       Chief Complaint   Patient presents w

## 2023-09-06 NOTE — ED Provider Notes (Signed)
 6:07 PM  Change of shift. Care of patient taken over from Dr. Arlington Calix; H&P reviewed, bedside handoff complete.  Awaiting CT    Patient signed out to me by outgoing physician pending CT.  CT does not show anything obvious acute.  Patient's been seen multip

## 2023-09-06 NOTE — ED Notes (Signed)
 First attempt to call report.

## 2023-09-06 NOTE — H&P (Addendum)
 Hospitalist Admission Note      NAME:  Melinda [name]   DOB:  07/24/1981   MRN:  [id number]     Date/Time:  09/06/2023 6:23 PM    Patient PCP: Orlean Bradford, APRN - NP    ________________________________________________________________________

## 2023-09-06 NOTE — ED Notes (Signed)
 Pt c/o 10/10. Stating "give me more medicine." Dry heaving into emesis bag. Restless. No change in vitals. Dr Elonda Husky aware, plan to admit patient.

## 2023-09-06 NOTE — ED Triage Notes (Addendum)
 Spanish translator [phone] used for triage assessment.     Pt presents to ED with c/o mid center abdominal pain that started this AM with associated vomiting. Pt reports multiple episodes of vomiting, denies any diarrhea. Pt denies recent sick contacts. No

## 2023-09-06 NOTE — Discharge Instructions (Addendum)
 HOSPITALIST DISCHARGE INSTRUCTIONS  NAME:  Melinda [name]   DOB:  1981-04-10   MRN:  [id number]     Date/Time:  09/14/2023 12:16 PM    ADMIT DATE: 09/06/2023     DISCHARGE DATE: 09/14/2023     DISCHARGE DIAGNOSIS:  gastroparesis    DISCHARGE INSTRU

## 2023-09-06 NOTE — Consults (Signed)
Session ID: [id number]  Language: Spanish  Interpreter ID: #[id number]  Interpreter Name: Mikle Bosworth

## 2023-09-07 LAB — CBC WITH AUTO DIFFERENTIAL
Basophils %: 1 % (ref 0–1)
Basophils Absolute: 0.1 10*3/uL (ref 0.0–0.1)
Eosinophils %: 0 % (ref 0–7)
Eosinophils Absolute: 0 10*3/uL (ref 0.0–0.4)
Hematocrit: 30.3 % — ABNORMAL LOW (ref 35.0–47.0)
Hemoglobin: 10 g/dL — ABNORMAL LOW (ref 11.5–16.0)
Immature Granulocytes %: 1 % — ABNORMAL HIGH (ref 0.0–0.5)
Immature Granulocytes Absolute: 0.1 10*3/uL — ABNORMAL HIGH (ref 0.00–0.04)
Lymphocytes %: 16 % (ref 12–49)
Lymphocytes Absolute: 2.2 10*3/uL (ref 0.8–3.5)
MCH: 28.7 pg (ref 26.0–34.0)
MCHC: 33 g/dL (ref 30.0–36.5)
MCV: 86.8 fL (ref 80.0–99.0)
MPV: 8.6 fL — ABNORMAL LOW (ref 8.9–12.9)
Monocytes %: 6 % (ref 5–13)
Monocytes Absolute: 0.9 10*3/uL (ref 0.0–1.0)
Neutrophils %: 76 % — ABNORMAL HIGH (ref 32–75)
Neutrophils Absolute: 10.6 10*3/uL — ABNORMAL HIGH (ref 1.8–8.0)
Nucleated RBCs: 0 /100{WBCs}
Platelets: 528 10*3/uL — ABNORMAL HIGH (ref 150–400)
RBC: 3.49 M/uL — ABNORMAL LOW (ref 3.80–5.20)
RDW: 13.2 % (ref 11.5–14.5)
WBC: 13.9 10*3/uL — ABNORMAL HIGH (ref 3.6–11.0)
nRBC: 0 10*3/uL (ref 0.00–0.01)

## 2023-09-07 LAB — EKG 12-LEAD
Atrial Rate: 122 {beats}/min
P Axis: 78 degrees
P-R Interval: 122 ms
Q-T Interval: 354 ms
QRS Duration: 84 ms
QTc Calculation (Bazett): 504 ms
R Axis: 264 degrees
T Axis: 55 degrees
Ventricular Rate: 122 {beats}/min

## 2023-09-07 LAB — POCT GLUCOSE
POC Glucose: 263 mg/dL — ABNORMAL HIGH (ref 65–117)
POC Glucose: 131 mg/dL — ABNORMAL HIGH (ref 65–117)
POC Glucose: 136 mg/dL — ABNORMAL HIGH (ref 65–117)
POC Glucose: 168 mg/dL — ABNORMAL HIGH (ref 65–117)

## 2023-09-07 LAB — BASIC METABOLIC PANEL
Anion Gap: 7 mmol/L (ref 2–12)
BUN/Creatinine Ratio: 14 (ref 12–20)
BUN: 28 mg/dL — ABNORMAL HIGH (ref 6–20)
CO2: 24 mmol/L (ref 21–32)
Calcium: 8.5 mg/dL (ref 8.5–10.1)
Chloride: 108 mmol/L (ref 97–108)
Creatinine: 1.96 mg/dL — ABNORMAL HIGH (ref 0.55–1.02)
Est, Glom Filt Rate: 32 mL/min/{1.73_m2} — ABNORMAL LOW (ref >60)
Glucose: 144 mg/dL — ABNORMAL HIGH (ref 65–100)
Potassium: 4.2 mmol/L (ref 3.5–5.1)
Sodium: 139 mmol/L (ref 136–145)

## 2023-09-07 LAB — HEMOGLOBIN A1C
Estimated Avg Glucose: 171 mg/dL
Hemoglobin A1C: 7.6 % — ABNORMAL HIGH (ref 4.0–5.6)

## 2023-09-07 MED ORDER — ENOXAPARIN SODIUM 30 MG/0.3ML IJ SOSY
300.3 MG/0.3ML | Freq: Every day | INTRAMUSCULAR | Status: AC
Start: 2023-09-07 — End: 2023-09-12
  Administered 2023-09-08 – 2023-09-12 (×5): 30 mg via SUBCUTANEOUS

## 2023-09-07 MED ORDER — HYDRALAZINE HCL 20 MG/ML IJ SOLN
20 MG/ML | Freq: Four times a day (QID) | INTRAMUSCULAR | Status: DC | PRN
Start: 2023-09-07 — End: 2023-09-14
  Administered 2023-09-08 – 2023-09-11 (×4): 5 mg via INTRAVENOUS

## 2023-09-07 MED ORDER — AMITRIPTYLINE HCL 25 MG PO TABS
25 MG | Freq: Every evening | ORAL | Status: DC
Start: 2023-09-07 — End: 2023-09-14
  Administered 2023-09-08 – 2023-09-14 (×7): 25 mg via ORAL

## 2023-09-07 MED ORDER — LABETALOL HCL 5 MG/ML IV SOLN
5 | Freq: Once | INTRAVENOUS | Status: AC
Start: 2023-09-07 — End: 2023-09-07
  Administered 2023-09-07: 19:00:00 10 mg via INTRAVENOUS

## 2023-09-07 MED ORDER — INSULIN GLARGINE 100 UNIT/ML SC SOLN
100 UNIT/ML | Freq: Every evening | SUBCUTANEOUS | Status: DC
Start: 2023-09-07 — End: 2023-09-12
  Administered 2023-09-07 – 2023-09-12 (×6): 11 [IU] via SUBCUTANEOUS

## 2023-09-07 MED ORDER — PROCHLORPERAZINE EDISYLATE 10 MG/2ML IJ SOLN
10 MG/2ML | Freq: Four times a day (QID) | INTRAMUSCULAR | Status: DC | PRN
Start: 2023-09-07 — End: 2023-09-14
  Administered 2023-09-07 – 2023-09-12 (×11): 10 mg via INTRAVENOUS

## 2023-09-07 MED FILL — LISINOPRIL 20 MG PO TABS: 20 MG | ORAL | Qty: 2

## 2023-09-07 MED FILL — METOCLOPRAMIDE HCL 5 MG/ML IJ SOLN: 5 MG/ML | INTRAMUSCULAR | Qty: 2

## 2023-09-07 MED FILL — ONDANSETRON HCL 4 MG/2ML IJ SOLN: 4 MG/2ML | INTRAMUSCULAR | Qty: 2

## 2023-09-07 MED FILL — HYDROMORPHONE HCL 1 MG/ML IJ SOLN: 1 MG/ML | INTRAMUSCULAR | Qty: 1

## 2023-09-07 MED FILL — PROCHLORPERAZINE EDISYLATE 10 MG/2ML IJ SOLN: 10 MG/2ML | INTRAMUSCULAR | Qty: 2

## 2023-09-07 MED FILL — CEFTRIAXONE SODIUM 1 G IJ SOLR: 1 g | INTRAMUSCULAR | Qty: 1000

## 2023-09-07 MED FILL — METOPROLOL SUCCINATE ER 50 MG PO TB24: 50 MG | ORAL | Qty: 1

## 2023-09-07 MED FILL — LABETALOL HCL 5 MG/ML IV SOLN: 5 MG/ML | INTRAVENOUS | Qty: 4

## 2023-09-07 MED FILL — AMLODIPINE BESYLATE 5 MG PO TABS: 5 MG | ORAL | Qty: 2

## 2023-09-07 MED FILL — PANTOPRAZOLE SODIUM 40 MG PO TBEC: 40 MG | ORAL | Qty: 1

## 2023-09-07 MED FILL — ENOXAPARIN SODIUM 40 MG/0.4ML IJ SOSY: 40 MG/0.4ML | INTRAMUSCULAR | Qty: 0.4

## 2023-09-07 MED FILL — INSULIN LISPRO 100 UNIT/ML IJ SOLN: 100 UNIT/ML | INTRAMUSCULAR | Qty: 2

## 2023-09-07 MED FILL — LANTUS 100 UNIT/ML SC SOLN: 100 UNIT/ML | SUBCUTANEOUS | Qty: 11

## 2023-09-07 NOTE — Progress Notes (Addendum)
 0140  Patient having nausea and vomiting.  Too early for zofran.  Discussed with Victorino Dike NP.  New orders received.    0350  Heart rate has improved through out the night.  Noticed blood pressure will drop after receiving dilaudid.      0700  Bedside and V

## 2023-09-07 NOTE — Care Coordination-Inpatient (Signed)
 Care Management Initial Assessment  09/07/2023 2:34 PM  If patient is discharged prior to next notation, then this note serves as note for discharge by case management.    Reason for Admission:   Tachycardia [R00.0]  Generalized abdominal pain [R10.84]

## 2023-09-07 NOTE — Plan of Care (Signed)
 Problem: Chronic Conditions and Co-morbidities  Goal: Patient's chronic conditions and co-morbidity symptoms are monitored and maintained or improved  Outcome: HH/HSPC Progressing  Flowsheets (Taken 09/06/2023 2058 by Karolee Ohs, RN)  Care Plan - Patie

## 2023-09-07 NOTE — Plan of Care (Signed)
 Problem: Chronic Conditions and Co-morbidities  Goal: Patient's chronic conditions and co-morbidity symptoms are monitored and maintained or improved  09/07/2023 1627 by Carlisle Beers, RN  Outcome: Progressing  09/07/2023 0912 by Ander Purpura, R

## 2023-09-07 NOTE — Consults (Signed)
 Session ID: [id number]  Language: Spanish  Interpreter ID: #[id number]  Interpreter Name: Ashby Dawes

## 2023-09-07 NOTE — Plan of Care (Signed)
 Problem: Chronic Conditions and Co-morbidities  Goal: Patient's chronic conditions and co-morbidity symptoms are monitored and maintained or improved  09/07/2023 2009 by Lurlean Nanny, RN  Outcome: Progressing     Problem: Discharge Planning  Goal: Dis

## 2023-09-07 NOTE — Consults (Signed)
 Session ID: [id number]  Language: Spanish  Interpreter ID: #[id number]  Interpreter Name: Varney Baas

## 2023-09-07 NOTE — Progress Notes (Signed)
 [facility] Hospitalist Group                                                                               Hospitalist Progress Note  [name], MD          Date of Service:  09/07/2023  NAME:  Melinda [name]

## 2023-09-07 NOTE — Progress Notes (Signed)
 Spiritual Health History and Assessment/Progress Note  [name]    Initial Encounter,  ,  ,      Name: Melinda [name] MRN: [id number]    Age: 42 y.o.     Sex: female   Language: Spanish   Religion: Catholic   Hypertensive urgency

## 2023-09-07 NOTE — Progress Notes (Addendum)
 0725 Bedside shift change report given to Schuyler Amor RN and Fish farm manager (oncoming nurse) by Mardene Celeste RN (offgoing nurse). Report included the following information Nurse Handoff Report.      2956 Made MD aware that patient c/o 10/10 pain to abdomen. Dilaudid n

## 2023-09-07 NOTE — Consults (Signed)
 Anael Rosch, PA-C                       [phone] office             Monday-Friday 8:00 am-4:30 pm  I am not permitted to use "perfect serve" use above for contact, thanks.

## 2023-09-08 LAB — POCT GLUCOSE
POC Glucose: 222 mg/dL — ABNORMAL HIGH (ref 65–117)
POC Glucose: 127 mg/dL — ABNORMAL HIGH (ref 65–117)
POC Glucose: 176 mg/dL — ABNORMAL HIGH (ref 65–117)
POC Glucose: 124 mg/dL — ABNORMAL HIGH (ref 65–117)

## 2023-09-08 LAB — CULTURE, URINE: Colony count: >100000

## 2023-09-08 LAB — EXTRA TUBES HOLD

## 2023-09-08 MED FILL — LANTUS 100 UNIT/ML SC SOLN: 100 UNIT/ML | SUBCUTANEOUS | Qty: 11

## 2023-09-08 MED FILL — METOCLOPRAMIDE HCL 5 MG/ML IJ SOLN: 5 MG/ML | INTRAMUSCULAR | Qty: 2

## 2023-09-08 MED FILL — METOPROLOL SUCCINATE ER 50 MG PO TB24: 50 MG | ORAL | Qty: 1

## 2023-09-08 MED FILL — CEFTRIAXONE SODIUM 1 G IJ SOLR: 1 g | INTRAMUSCULAR | Qty: 1000

## 2023-09-08 MED FILL — ENOXAPARIN SODIUM 30 MG/0.3ML IJ SOSY: 30 MG/0.3ML | INTRAMUSCULAR | Qty: 0.3

## 2023-09-08 MED FILL — AMLODIPINE BESYLATE 5 MG PO TABS: 5 MG | ORAL | Qty: 2

## 2023-09-08 MED FILL — PANTOPRAZOLE SODIUM 40 MG PO TBEC: 40 MG | ORAL | Qty: 1

## 2023-09-08 MED FILL — ONDANSETRON HCL 4 MG/2ML IJ SOLN: 4 MG/2ML | INTRAMUSCULAR | Qty: 2

## 2023-09-08 MED FILL — AMITRIPTYLINE HCL 25 MG PO TABS: 25 MG | ORAL | Qty: 1

## 2023-09-08 MED FILL — HYDROMORPHONE HCL 1 MG/ML IJ SOLN: 1 MG/ML | INTRAMUSCULAR | Qty: 1

## 2023-09-08 MED FILL — LISINOPRIL 20 MG PO TABS: 20 MG | ORAL | Qty: 2

## 2023-09-08 MED FILL — PROCHLORPERAZINE EDISYLATE 10 MG/2ML IJ SOLN: 10 MG/2ML | INTRAMUSCULAR | Qty: 2

## 2023-09-08 MED FILL — HYDRALAZINE HCL 20 MG/ML IJ SOLN: 20 MG/ML | INTRAMUSCULAR | Qty: 1

## 2023-09-08 MED FILL — INSULIN LISPRO 100 UNIT/ML IJ SOLN: 100 UNIT/ML | INTRAMUSCULAR | Qty: 2

## 2023-09-08 NOTE — Plan of Care (Signed)
 Problem: Chronic Conditions and Co-morbidities  Goal: Patient's chronic conditions and co-morbidity symptoms are monitored and maintained or improved  09/08/2023 0857 by Ernesto Rutherford, RN  Outcome: Progressing  09/07/2023 2009 by Lurlean Nanny, RN

## 2023-09-08 NOTE — Plan of Care (Signed)
 Problem: Chronic Conditions and Co-morbidities  Goal: Patient's chronic conditions and co-morbidity symptoms are monitored and maintained or improved  09/08/2023 2218 by Maryruth Hancock, RN  Outcome: Progressing  09/08/2023 0857 by Ernesto Rutherford, RN  Biagio Borg

## 2023-09-08 NOTE — Progress Notes (Signed)
 [facility] Hospitalist Group                                                                               Hospitalist Progress Note  [name], MD          Date of Service:  09/08/2023  NAME:  Melinda [name]

## 2023-09-08 NOTE — Progress Notes (Signed)
 Patient blood sugar 124.

## 2023-09-08 NOTE — Progress Notes (Signed)
 Spiritual Health History and Assessment/Progress Note  [facility]    Rituals, Rites and Sacraments,  ,  ,      Name: Melinda [name] MRN: [id number]    Age: 42 y.o.     Sex: female   Language: Spanish   Religion: Catholic   Hypertensi

## 2023-09-09 ENCOUNTER — Inpatient Hospital Stay: Admit: 2023-09-10 | Payer: MEDICAID | Primary: Adult Health

## 2023-09-09 LAB — BASIC METABOLIC PANEL
Anion Gap: 6 mmol/L (ref 2–12)
BUN/Creatinine Ratio: 11 — ABNORMAL LOW (ref 12–20)
BUN: 26 mg/dL — ABNORMAL HIGH (ref 6–20)
CO2: 23 mmol/L (ref 21–32)
Calcium: 8.2 mg/dL — ABNORMAL LOW (ref 8.5–10.1)
Chloride: 107 mmol/L (ref 97–108)
Creatinine: 2.47 mg/dL — ABNORMAL HIGH (ref 0.55–1.02)
Est, Glom Filt Rate: 25 mL/min/{1.73_m2} — ABNORMAL LOW (ref >60)
Glucose: 116 mg/dL — ABNORMAL HIGH (ref 65–100)
Potassium: 3.8 mmol/L (ref 3.5–5.1)
Sodium: 136 mmol/L (ref 136–145)

## 2023-09-09 LAB — CBC WITH AUTO DIFFERENTIAL
Basophils %: 1 % (ref 0–1)
Basophils Absolute: 0.1 10*3/uL (ref 0.0–0.1)
Eosinophils %: 2 % (ref 0–7)
Eosinophils Absolute: 0.2 10*3/uL (ref 0.0–0.4)
Hematocrit: 28 % — ABNORMAL LOW (ref 35.0–47.0)
Hemoglobin: 9 g/dL — ABNORMAL LOW (ref 11.5–16.0)
Immature Granulocytes %: 0 % (ref 0.0–0.5)
Immature Granulocytes Absolute: 0 10*3/uL (ref 0.00–0.04)
Lymphocytes %: 16 % (ref 12–49)
Lymphocytes Absolute: 1.5 10*3/uL (ref 0.8–3.5)
MCH: 28.6 pg (ref 26.0–34.0)
MCHC: 32.1 g/dL (ref 30.0–36.5)
MCV: 88.9 fL (ref 80.0–99.0)
MPV: 8.8 fL — ABNORMAL LOW (ref 8.9–12.9)
Monocytes %: 7 % (ref 5–13)
Monocytes Absolute: 0.7 10*3/uL (ref 0.0–1.0)
Neutrophils %: 74 % (ref 32–75)
Neutrophils Absolute: 7.2 10*3/uL (ref 1.8–8.0)
Nucleated RBCs: 0 /100{WBCs}
Platelets: 457 10*3/uL — ABNORMAL HIGH (ref 150–400)
RBC: 3.15 M/uL — ABNORMAL LOW (ref 3.80–5.20)
RDW: 13.3 % (ref 11.5–14.5)
WBC: 9.7 10*3/uL (ref 3.6–11.0)
nRBC: 0 10*3/uL (ref 0.00–0.01)

## 2023-09-09 LAB — POCT GLUCOSE
POC Glucose: 128 mg/dL — ABNORMAL HIGH (ref 65–117)
POC Glucose: 137 mg/dL — ABNORMAL HIGH (ref 65–117)
POC Glucose: 243 mg/dL — ABNORMAL HIGH (ref 65–117)
POC Glucose: 85 mg/dL (ref 65–117)

## 2023-09-09 LAB — PHOSPHORUS: Phosphorus: 4.4 mg/dL (ref 2.6–4.7)

## 2023-09-09 LAB — MAGNESIUM: Magnesium: 2.3 mg/dL (ref 1.6–2.4)

## 2023-09-09 MED ORDER — SODIUM CHLORIDE 0.9 % IV SOLN
0.9 % | INTRAVENOUS | Status: AC
Start: 2023-09-09 — End: 2023-09-13
  Administered 2023-09-09 – 2023-09-12 (×4): via INTRAVENOUS

## 2023-09-09 MED ORDER — BISACODYL 10 MG RE SUPP
10 MG | Freq: Every day | RECTAL | Status: DC | PRN
Start: 2023-09-09 — End: 2023-09-14

## 2023-09-09 MED ORDER — SODIUM CHLORIDE 0.9 % IV BOLUS
0.9 | Freq: Once | INTRAVENOUS | Status: DC
Start: 2023-09-09 — End: 2023-09-09

## 2023-09-09 MED FILL — METOPROLOL SUCCINATE ER 50 MG PO TB24: 50 MG | ORAL | Qty: 1

## 2023-09-09 MED FILL — INSULIN LISPRO 100 UNIT/ML IJ SOLN: 100 UNIT/ML | INTRAMUSCULAR | Qty: 1

## 2023-09-09 MED FILL — METOCLOPRAMIDE HCL 5 MG/ML IJ SOLN: 5 MG/ML | INTRAMUSCULAR | Qty: 2

## 2023-09-09 MED FILL — AMITRIPTYLINE HCL 25 MG PO TABS: 25 MG | ORAL | Qty: 1

## 2023-09-09 MED FILL — LANTUS 100 UNIT/ML SC SOLN: 100 UNIT/ML | SUBCUTANEOUS | Qty: 11

## 2023-09-09 MED FILL — CEFTRIAXONE SODIUM 1 G IJ SOLR: 1 g | INTRAMUSCULAR | Qty: 1000

## 2023-09-09 MED FILL — HYDROMORPHONE HCL 1 MG/ML IJ SOLN: 1 MG/ML | INTRAMUSCULAR | Qty: 1

## 2023-09-09 MED FILL — PROCHLORPERAZINE EDISYLATE 10 MG/2ML IJ SOLN: 10 MG/2ML | INTRAMUSCULAR | Qty: 2

## 2023-09-09 MED FILL — SODIUM CHLORIDE 0.9 % IV SOLN: 0.9 % | INTRAVENOUS | Qty: 1000

## 2023-09-09 MED FILL — AMLODIPINE BESYLATE 5 MG PO TABS: 5 MG | ORAL | Qty: 2

## 2023-09-09 MED FILL — PANTOPRAZOLE SODIUM 40 MG PO TBEC: 40 MG | ORAL | Qty: 1

## 2023-09-09 MED FILL — LISINOPRIL 20 MG PO TABS: 20 MG | ORAL | Qty: 2

## 2023-09-09 MED FILL — ENOXAPARIN SODIUM 30 MG/0.3ML IJ SOSY: 30 MG/0.3ML | INTRAMUSCULAR | Qty: 0.3

## 2023-09-09 MED FILL — HYDRALAZINE HCL 20 MG/ML IJ SOLN: 20 MG/ML | INTRAMUSCULAR | Qty: 1

## 2023-09-09 NOTE — Plan of Care (Signed)
 Problem: Chronic Conditions and Co-morbidities  Goal: Patient's chronic conditions and co-morbidity symptoms are monitored and maintained or improved  09/09/2023 1033 by Danella Sensing, LPN  Outcome: Progressing  09/08/2023 2218 by Maryruth Hancock, RN  Biagio Borg

## 2023-09-09 NOTE — Progress Notes (Signed)
 [facility] Hospitalist Group                                                                               Hospitalist Progress Note  [name], MD          Date of Service:  09/09/2023  NAME:  Melinda [name]

## 2023-09-10 ENCOUNTER — Inpatient Hospital Stay: Admit: 2023-09-10 | Payer: MEDICAID | Primary: Adult Health

## 2023-09-10 LAB — CBC WITH AUTO DIFFERENTIAL
Basophils %: 1 % (ref 0–1)
Basophils Absolute: 0.1 10*3/uL (ref 0.0–0.1)
Eosinophils %: 4 % (ref 0–7)
Eosinophils Absolute: 0.4 10*3/uL (ref 0.0–0.4)
Hematocrit: 27.4 % — ABNORMAL LOW (ref 35.0–47.0)
Hemoglobin: 8.8 g/dL — ABNORMAL LOW (ref 11.5–16.0)
Immature Granulocytes %: 0 % (ref 0.0–0.5)
Immature Granulocytes Absolute: 0 10*3/uL (ref 0.00–0.04)
Lymphocytes %: 29 % (ref 12–49)
Lymphocytes Absolute: 2.4 10*3/uL (ref 0.8–3.5)
MCH: 28.9 pg (ref 26.0–34.0)
MCHC: 32.1 g/dL (ref 30.0–36.5)
MCV: 89.8 fL (ref 80.0–99.0)
MPV: 8.5 fL — ABNORMAL LOW (ref 8.9–12.9)
Monocytes %: 11 % (ref 5–13)
Monocytes Absolute: 0.9 10*3/uL (ref 0.0–1.0)
Neutrophils %: 55 % (ref 32–75)
Neutrophils Absolute: 4.5 10*3/uL (ref 1.8–8.0)
Nucleated RBCs: 0 /100{WBCs}
Platelets: 360 10*3/uL (ref 150–400)
RBC: 3.05 M/uL — ABNORMAL LOW (ref 3.80–5.20)
RDW: 13 % (ref 11.5–14.5)
WBC: 8.2 10*3/uL (ref 3.6–11.0)
nRBC: 0 10*3/uL (ref 0.00–0.01)

## 2023-09-10 LAB — BASIC METABOLIC PANEL
Anion Gap: 5 mmol/L (ref 2–12)
Anion Gap: 4 mmol/L (ref 2–12)
BUN/Creatinine Ratio: 10 — ABNORMAL LOW (ref 12–20)
BUN/Creatinine Ratio: 10 — ABNORMAL LOW (ref 12–20)
BUN: 24 mg/dL — ABNORMAL HIGH (ref 6–20)
BUN: 21 mg/dL — ABNORMAL HIGH (ref 6–20)
CO2: 24 mmol/L (ref 21–32)
CO2: 24 mmol/L (ref 21–32)
Calcium: 7.6 mg/dL — ABNORMAL LOW (ref 8.5–10.1)
Calcium: 7.6 mg/dL — ABNORMAL LOW (ref 8.5–10.1)
Chloride: 107 mmol/L (ref 97–108)
Chloride: 109 mmol/L — ABNORMAL HIGH (ref 97–108)
Creatinine: 2.34 mg/dL — ABNORMAL HIGH (ref 0.55–1.02)
Creatinine: 2.2 mg/dL — ABNORMAL HIGH (ref 0.55–1.02)
Est, Glom Filt Rate: 26 mL/min/{1.73_m2} — ABNORMAL LOW (ref >60)
Est, Glom Filt Rate: 28 mL/min/{1.73_m2} — ABNORMAL LOW (ref >60)
Glucose: 166 mg/dL — ABNORMAL HIGH (ref 65–100)
Glucose: 107 mg/dL — ABNORMAL HIGH (ref 65–100)
Potassium: 4.1 mmol/L (ref 3.5–5.1)
Potassium: 4 mmol/L (ref 3.5–5.1)
Sodium: 136 mmol/L (ref 136–145)
Sodium: 137 mmol/L (ref 136–145)

## 2023-09-10 LAB — POCT GLUCOSE
POC Glucose: 222 mg/dL — ABNORMAL HIGH (ref 65–117)
POC Glucose: 123 mg/dL — ABNORMAL HIGH (ref 65–117)
POC Glucose: 95 mg/dL (ref 65–117)
POC Glucose: 85 mg/dL (ref 65–117)

## 2023-09-10 LAB — MAGNESIUM: Magnesium: 2 mg/dL (ref 1.6–2.4)

## 2023-09-10 LAB — PHOSPHORUS: Phosphorus: 3.9 mg/dL (ref 2.6–4.7)

## 2023-09-10 MED ORDER — POLYETHYLENE GLYCOL 3350 17 G PO PACK
17 g | Freq: Every day | ORAL | Status: DC
Start: 2023-09-10 — End: 2023-09-11
  Administered 2023-09-10 – 2023-09-11 (×2): 17 g via ORAL

## 2023-09-10 MED ORDER — SENNA-DOCUSATE SODIUM 8.6-50 MG PO TABS
Freq: Two times a day (BID) | ORAL | Status: DC
Start: 2023-09-10 — End: 2023-09-14
  Administered 2023-09-11 – 2023-09-14 (×7): 2 via ORAL

## 2023-09-10 MED FILL — PROCHLORPERAZINE EDISYLATE 10 MG/2ML IJ SOLN: 10 MG/2ML | INTRAMUSCULAR | Qty: 2

## 2023-09-10 MED FILL — POLYETHYLENE GLYCOL 3350 17 G PO PACK: 17 g | ORAL | Qty: 1

## 2023-09-10 MED FILL — ENOXAPARIN SODIUM 30 MG/0.3ML IJ SOSY: 30 MG/0.3ML | INTRAMUSCULAR | Qty: 0.3

## 2023-09-10 MED FILL — LISINOPRIL 20 MG PO TABS: 20 MG | ORAL | Qty: 2

## 2023-09-10 MED FILL — PANTOPRAZOLE SODIUM 40 MG PO TBEC: 40 MG | ORAL | Qty: 1

## 2023-09-10 MED FILL — INSULIN LISPRO 100 UNIT/ML IJ SOLN: 100 UNIT/ML | INTRAMUSCULAR | Qty: 1

## 2023-09-10 MED FILL — HYDROMORPHONE HCL 1 MG/ML IJ SOLN: 1 MG/ML | INTRAMUSCULAR | Qty: 1

## 2023-09-10 MED FILL — AMLODIPINE BESYLATE 5 MG PO TABS: 5 MG | ORAL | Qty: 2

## 2023-09-10 MED FILL — METOCLOPRAMIDE HCL 5 MG/ML IJ SOLN: 5 MG/ML | INTRAMUSCULAR | Qty: 2

## 2023-09-10 MED FILL — AMITRIPTYLINE HCL 25 MG PO TABS: 25 MG | ORAL | Qty: 1

## 2023-09-10 MED FILL — CEFTRIAXONE SODIUM 1 G IJ SOLR: 1 g | INTRAMUSCULAR | Qty: 1000

## 2023-09-10 MED FILL — LANTUS 100 UNIT/ML SC SOLN: 100 UNIT/ML | SUBCUTANEOUS | Qty: 11

## 2023-09-10 MED FILL — METOPROLOL SUCCINATE ER 50 MG PO TB24: 50 MG | ORAL | Qty: 1

## 2023-09-10 NOTE — Progress Notes (Addendum)
 Hospitalist Progress Note      NAME:  Melinda [name]   DOB:  30-Apr-1981  MRM:  [id number]    Date/Time: 09/10/2023  7:05 AM           Assessment / Plan:     Patient is a 42 year old female with a history of hypertension, GERD, hyperlipidemia, dia

## 2023-09-10 NOTE — Plan of Care (Signed)
 Problem: Chronic Conditions and Co-morbidities  Goal: Patient's chronic conditions and co-morbidity symptoms are monitored and maintained or improved  Outcome: Progressing     Problem: Discharge Planning  Goal: Discharge to home or other facility with ap

## 2023-09-11 LAB — POCT GLUCOSE
POC Glucose: 124 mg/dL — ABNORMAL HIGH (ref 65–117)
POC Glucose: 77 mg/dL (ref 65–117)
POC Glucose: 140 mg/dL — ABNORMAL HIGH (ref 65–117)
POC Glucose: 118 mg/dL — ABNORMAL HIGH (ref 65–117)

## 2023-09-11 LAB — BASIC METABOLIC PANEL
Anion Gap: 4 mmol/L (ref 2–12)
BUN/Creatinine Ratio: 10 — ABNORMAL LOW (ref 12–20)
BUN: 17 mg/dL (ref 6–20)
CO2: 22 mmol/L (ref 21–32)
Calcium: 7.6 mg/dL — ABNORMAL LOW (ref 8.5–10.1)
Chloride: 114 mmol/L — ABNORMAL HIGH (ref 97–108)
Creatinine: 1.69 mg/dL — ABNORMAL HIGH (ref 0.55–1.02)
Est, Glom Filt Rate: 39 mL/min/{1.73_m2} — ABNORMAL LOW (ref >60)
Glucose: 70 mg/dL (ref 65–100)
Potassium: 3.8 mmol/L (ref 3.5–5.1)
Sodium: 140 mmol/L (ref 136–145)

## 2023-09-11 LAB — CBC WITH AUTO DIFFERENTIAL
Basophils %: 1 % (ref 0–1)
Basophils Absolute: 0.1 10*3/uL (ref 0.0–0.1)
Eosinophils %: 4 % (ref 0–7)
Eosinophils Absolute: 0.3 10*3/uL (ref 0.0–0.4)
Hematocrit: 25.7 % — ABNORMAL LOW (ref 35.0–47.0)
Hemoglobin: 8.3 g/dL — ABNORMAL LOW (ref 11.5–16.0)
Immature Granulocytes %: 0 % (ref 0.0–0.5)
Immature Granulocytes Absolute: 0 10*3/uL (ref 0.00–0.04)
Lymphocytes %: 20 % (ref 12–49)
Lymphocytes Absolute: 1.6 10*3/uL (ref 0.8–3.5)
MCH: 28.2 pg (ref 26.0–34.0)
MCHC: 32.3 g/dL (ref 30.0–36.5)
MCV: 87.4 fL (ref 80.0–99.0)
MPV: 8.6 fL — ABNORMAL LOW (ref 8.9–12.9)
Monocytes %: 8 % (ref 5–13)
Monocytes Absolute: 0.7 10*3/uL (ref 0.0–1.0)
Neutrophils %: 67 % (ref 32–75)
Neutrophils Absolute: 5.4 10*3/uL (ref 1.8–8.0)
Nucleated RBCs: 0 /100{WBCs}
Platelets: 381 10*3/uL (ref 150–400)
RBC: 2.94 M/uL — ABNORMAL LOW (ref 3.80–5.20)
RDW: 12.8 % (ref 11.5–14.5)
WBC: 8.1 10*3/uL (ref 3.6–11.0)
nRBC: 0 10*3/uL (ref 0.00–0.01)

## 2023-09-11 LAB — FERRITIN: Ferritin: 19 ng/mL — ABNORMAL LOW (ref 26–388)

## 2023-09-11 MED ORDER — SUCRALFATE 1 G PO TABS
1 GM | Freq: Three times a day (TID) | ORAL | Status: DC | PRN
Start: 2023-09-11 — End: 2023-09-14
  Administered 2023-09-12 – 2023-09-13 (×2): 1 g via ORAL

## 2023-09-11 MED ORDER — IRON SUCROSE 20 MG/ML IV SOLN
20 MG/ML | INTRAVENOUS | Status: AC
Start: 2023-09-11 — End: 2023-09-12
  Administered 2023-09-11 – 2023-09-12 (×2): 200 mg via INTRAVENOUS

## 2023-09-11 MED ORDER — POLYETHYLENE GLYCOL 3350 17 G PO PACK
17 g | Freq: Two times a day (BID) | ORAL | Status: DC
Start: 2023-09-11 — End: 2023-09-14
  Administered 2023-09-12 – 2023-09-14 (×5): 17 g via ORAL

## 2023-09-11 MED ORDER — MINERAL OIL RE ENEM
Freq: Once | RECTAL | Status: AC | PRN
Start: 2023-09-11 — End: 2023-09-12

## 2023-09-11 MED ORDER — BISACODYL 10 MG RE SUPP
10 | Freq: Once | RECTAL | Status: AC
Start: 2023-09-11 — End: 2023-09-11
  Administered 2023-09-11: 18:00:00 10 mg via RECTAL

## 2023-09-11 MED ORDER — IRON SUCROSE 20 MG/ML IV SOLN
20 | INTRAVENOUS | Status: DC
Start: 2023-09-11 — End: 2023-09-11

## 2023-09-11 MED FILL — AMLODIPINE BESYLATE 5 MG PO TABS: 5 MG | ORAL | Qty: 2

## 2023-09-11 MED FILL — METOCLOPRAMIDE HCL 5 MG/ML IJ SOLN: 5 MG/ML | INTRAMUSCULAR | Qty: 2

## 2023-09-11 MED FILL — HYDROMORPHONE HCL 1 MG/ML IJ SOLN: 1 MG/ML | INTRAMUSCULAR | Qty: 1

## 2023-09-11 MED FILL — VENOFER 20 MG/ML IV SOLN: 20 MG/ML | INTRAVENOUS | Qty: 10

## 2023-09-11 MED FILL — LANTUS 100 UNIT/ML SC SOLN: 100 UNIT/ML | SUBCUTANEOUS | Qty: 11

## 2023-09-11 MED FILL — FLEET OIL RE ENEM: RECTAL | Qty: 1

## 2023-09-11 MED FILL — STOOL SOFTENER/LAXATIVE 50-8.6 MG PO TABS: ORAL | Qty: 2

## 2023-09-11 MED FILL — POLYETHYLENE GLYCOL 3350 17 G PO PACK: 17 g | ORAL | Qty: 1

## 2023-09-11 MED FILL — METOPROLOL SUCCINATE ER 50 MG PO TB24: 50 MG | ORAL | Qty: 1

## 2023-09-11 MED FILL — PANTOPRAZOLE SODIUM 40 MG PO TBEC: 40 MG | ORAL | Qty: 1

## 2023-09-11 MED FILL — AMITRIPTYLINE HCL 25 MG PO TABS: 25 MG | ORAL | Qty: 1

## 2023-09-11 MED FILL — DULCOLAX 10 MG RE SUPP: 10 MG | RECTAL | Qty: 1

## 2023-09-11 MED FILL — HYDRALAZINE HCL 20 MG/ML IJ SOLN: 20 MG/ML | INTRAMUSCULAR | Qty: 1

## 2023-09-11 MED FILL — ENOXAPARIN SODIUM 30 MG/0.3ML IJ SOSY: 30 MG/0.3ML | INTRAMUSCULAR | Qty: 0.3

## 2023-09-11 MED FILL — LISINOPRIL 20 MG PO TABS: 20 MG | ORAL | Qty: 2

## 2023-09-11 MED FILL — PROCHLORPERAZINE EDISYLATE 10 MG/2ML IJ SOLN: 10 MG/2ML | INTRAMUSCULAR | Qty: 2

## 2023-09-11 NOTE — Consults (Signed)
 [facility]             GI PROGRESS NOTE        NAME: Simrin Vegh   DOB:  03/08/1981   MRN:  [id number]       Subjective:   -persistent abdominal pain with nausea in the afternoon  -woke up with severe pain  -not tolerating

## 2023-09-11 NOTE — Progress Notes (Addendum)
 Hospitalist Progress Note      NAME:  Melinda [name]   DOB:  1981/07/15  MRM:  [id number]    Date/Time: 09/11/2023  7:16 AM           Assessment / Plan:     Patient is a 42 year old female with a history of hypertension, GERD, hyperlipidemia, diab

## 2023-09-11 NOTE — Consults (Signed)
 Session ID: [id number]  Language: Spanish  Interpreter ID: #[id number]  Interpreter Name: Chuck Hint

## 2023-09-12 LAB — BASIC METABOLIC PANEL
Anion Gap: 6 mmol/L (ref 2–12)
BUN/Creatinine Ratio: 9 — ABNORMAL LOW (ref 12–20)
BUN: 15 mg/dL (ref 6–20)
CO2: 21 mmol/L (ref 21–32)
Calcium: 7.8 mg/dL — ABNORMAL LOW (ref 8.5–10.1)
Chloride: 112 mmol/L — ABNORMAL HIGH (ref 97–108)
Creatinine: 1.72 mg/dL — ABNORMAL HIGH (ref 0.55–1.02)
Est, Glom Filt Rate: 38 mL/min/{1.73_m2} — ABNORMAL LOW (ref >60)
Glucose: 64 mg/dL — ABNORMAL LOW (ref 65–100)
Potassium: 3.7 mmol/L (ref 3.5–5.1)
Sodium: 139 mmol/L (ref 136–145)

## 2023-09-12 LAB — CBC WITH AUTO DIFFERENTIAL
Basophils %: 1 % (ref 0–1)
Basophils Absolute: 0.1 10*3/uL (ref 0.0–0.1)
Eosinophils %: 5 % (ref 0–7)
Eosinophils Absolute: 0.4 10*3/uL (ref 0.0–0.4)
Hematocrit: 28.9 % — ABNORMAL LOW (ref 35.0–47.0)
Hemoglobin: 9.5 g/dL — ABNORMAL LOW (ref 11.5–16.0)
Immature Granulocytes %: 0 % (ref 0.0–0.5)
Immature Granulocytes Absolute: 0 10*3/uL (ref 0.00–0.04)
Lymphocytes %: 22 % (ref 12–49)
Lymphocytes Absolute: 1.7 10*3/uL (ref 0.8–3.5)
MCH: 28.8 pg (ref 26.0–34.0)
MCHC: 32.9 g/dL (ref 30.0–36.5)
MCV: 87.6 fL (ref 80.0–99.0)
MPV: 8.8 fL — ABNORMAL LOW (ref 8.9–12.9)
Monocytes %: 8 % (ref 5–13)
Monocytes Absolute: 0.6 10*3/uL (ref 0.0–1.0)
Neutrophils %: 64 % (ref 32–75)
Neutrophils Absolute: 5.1 10*3/uL (ref 1.8–8.0)
Nucleated RBCs: 0 /100{WBCs}
Platelets: 400 10*3/uL (ref 150–400)
RBC: 3.3 M/uL — ABNORMAL LOW (ref 3.80–5.20)
RDW: 12.9 % (ref 11.5–14.5)
WBC: 7.9 10*3/uL (ref 3.6–11.0)
nRBC: 0 10*3/uL (ref 0.00–0.01)

## 2023-09-12 LAB — POCT GLUCOSE
POC Glucose: 229 mg/dL — ABNORMAL HIGH (ref 65–117)
POC Glucose: 84 mg/dL (ref 65–117)
POC Glucose: 81 mg/dL (ref 65–117)
POC Glucose: 107 mg/dL (ref 65–117)

## 2023-09-12 MED ORDER — ENOXAPARIN SODIUM 40 MG/0.4ML IJ SOSY
40 | Freq: Every day | INTRAMUSCULAR | Status: DC
Start: 2023-09-12 — End: 2023-09-14
  Administered 2023-09-13 – 2023-09-14 (×2): 40 mg via SUBCUTANEOUS

## 2023-09-12 MED ORDER — INSULIN GLARGINE 100 UNIT/ML SC SOLN
100 | Freq: Every evening | SUBCUTANEOUS | Status: DC
Start: 2023-09-12 — End: 2023-09-14
  Administered 2023-09-13: 02:00:00 8 [IU] via SUBCUTANEOUS

## 2023-09-12 MED ORDER — MINERAL OIL RE ENEM
Freq: Once | RECTAL | Status: AC
Start: 2023-09-12 — End: 2023-09-12
  Administered 2023-09-12: 16:00:00 1 via RECTAL

## 2023-09-12 MED ORDER — TRAMADOL HCL 50 MG PO TABS
50 | Freq: Four times a day (QID) | ORAL | Status: DC | PRN
Start: 2023-09-12 — End: 2023-09-14
  Administered 2023-09-12 – 2023-09-13 (×2): 50 mg via ORAL

## 2023-09-12 MED FILL — PANTOPRAZOLE SODIUM 40 MG PO TBEC: 40 MG | ORAL | Qty: 1

## 2023-09-12 MED FILL — AMITRIPTYLINE HCL 25 MG PO TABS: 25 MG | ORAL | Qty: 1

## 2023-09-12 MED FILL — POLYETHYLENE GLYCOL 3350 17 G PO PACK: 17 g | ORAL | Qty: 1

## 2023-09-12 MED FILL — INSULIN LISPRO 100 UNIT/ML IJ SOLN: 100 UNIT/ML | INTRAMUSCULAR | Qty: 1

## 2023-09-12 MED FILL — ENOXAPARIN SODIUM 30 MG/0.3ML IJ SOSY: 30 MG/0.3ML | INTRAMUSCULAR | Qty: 0.3

## 2023-09-12 MED FILL — TRAMADOL HCL 50 MG PO TABS: 50 MG | ORAL | Qty: 1

## 2023-09-12 MED FILL — HYDROMORPHONE HCL 1 MG/ML IJ SOLN: 1 MG/ML | INTRAMUSCULAR | Qty: 1

## 2023-09-12 MED FILL — VENOFER 20 MG/ML IV SOLN: 20 MG/ML | INTRAVENOUS | Qty: 10

## 2023-09-12 MED FILL — LISINOPRIL 20 MG PO TABS: 20 MG | ORAL | Qty: 2

## 2023-09-12 MED FILL — METOCLOPRAMIDE HCL 5 MG/ML IJ SOLN: 5 MG/ML | INTRAMUSCULAR | Qty: 2

## 2023-09-12 MED FILL — PROCHLORPERAZINE EDISYLATE 10 MG/2ML IJ SOLN: 10 MG/2ML | INTRAMUSCULAR | Qty: 2

## 2023-09-12 MED FILL — SUCRALFATE 1 G PO TABS: 1 GM | ORAL | Qty: 1

## 2023-09-12 MED FILL — STOOL SOFTENER/LAXATIVE 50-8.6 MG PO TABS: ORAL | Qty: 2

## 2023-09-12 MED FILL — METOPROLOL SUCCINATE ER 50 MG PO TB24: 50 MG | ORAL | Qty: 1

## 2023-09-12 MED FILL — AMLODIPINE BESYLATE 5 MG PO TABS: 5 MG | ORAL | Qty: 2

## 2023-09-12 MED FILL — FLEET OIL RE ENEM: RECTAL | Qty: 1

## 2023-09-12 MED FILL — LANTUS 100 UNIT/ML SC SOLN: 100 UNIT/ML | SUBCUTANEOUS | Qty: 11

## 2023-09-12 NOTE — Plan of Care (Signed)
 Problem: Occupational Therapy - Adult  Goal: By Discharge: Performs self-care activities at highest level of function for planned discharge setting.  See evaluation for individualized goals.  Description: FUNCTIONAL STATUS PRIOR TO ADMISSION:  The patien

## 2023-09-12 NOTE — Plan of Care (Signed)
 Problem: Chronic Conditions and Co-morbidities  Goal: Patient's chronic conditions and co-morbidity symptoms are monitored and maintained or improved  Outcome: Progressing  Flowsheets (Taken 09/12/2023 0745)  Care Plan - Patient's Chronic Conditions and C

## 2023-09-12 NOTE — Progress Notes (Signed)
 Melinda Farro, PA-C                       [phone] office             Monday-Friday 8:00 am-4:30 pm  I am not permitted to use "perfect serve" use above

## 2023-09-12 NOTE — Consults (Signed)
 Session ID: [id number]  Language: Spanish  Interpreter ID: [phone]  Interpreter Name: Norva Pavlov

## 2023-09-12 NOTE — Consults (Signed)
 Session ID: [id number]  Language: Spanish  Interpreter ID: #[id number]  Interpreter Name: Marquita Palms

## 2023-09-12 NOTE — Progress Notes (Signed)
 Hospitalist Progress Note      NAME:  Melinda [name]   DOB:  08-25-1981  MRM:  [id number]    Date/Time: 09/12/2023  6:47 AM           Assessment / Plan:     Patient is a 42 year old female with a history of hypertension, GERD, hyperlipidemia, diab

## 2023-09-12 NOTE — Plan of Care (Signed)
 Problem: Physical Therapy - Adult  Goal: By Discharge: Performs mobility at highest level of function for planned discharge setting.  See evaluation for individualized goals.  Description: FUNCTIONAL STATUS PRIOR TO ADMISSION: Pt reports indep LOF with m

## 2023-09-13 LAB — BASIC METABOLIC PANEL
Anion Gap: 6 mmol/L (ref 2–12)
BUN/Creatinine Ratio: 8 — ABNORMAL LOW (ref 12–20)
BUN: 12 mg/dL (ref 6–20)
CO2: 21 mmol/L (ref 21–32)
Calcium: 7.9 mg/dL — ABNORMAL LOW (ref 8.5–10.1)
Chloride: 112 mmol/L — ABNORMAL HIGH (ref 97–108)
Creatinine: 1.47 mg/dL — ABNORMAL HIGH (ref 0.55–1.02)
Est, Glom Filt Rate: 46 mL/min/{1.73_m2} — ABNORMAL LOW (ref >60)
Glucose: 67 mg/dL (ref 65–100)
Potassium: 3.5 mmol/L (ref 3.5–5.1)
Sodium: 139 mmol/L (ref 136–145)

## 2023-09-13 LAB — POCT GLUCOSE
POC Glucose: 141 mg/dL — ABNORMAL HIGH (ref 65–117)
POC Glucose: 72 mg/dL (ref 65–117)
POC Glucose: 171 mg/dL — ABNORMAL HIGH (ref 65–117)
POC Glucose: 117 mg/dL (ref 65–117)

## 2023-09-13 MED ORDER — DEXTROSE 10 % IV BOLUS
Freq: Once | INTRAVENOUS | Status: DC
Start: 2023-09-13 — End: 2023-09-13

## 2023-09-13 MED ORDER — METOPROLOL SUCCINATE ER 50 MG PO TB24
50 | Freq: Every day | ORAL | Status: DC
Start: 2023-09-13 — End: 2023-09-14
  Administered 2023-09-13 – 2023-09-14 (×2): 100 mg via ORAL

## 2023-09-13 MED ORDER — MINERAL OIL RE ENEM
Freq: Once | RECTAL | Status: DC
Start: 2023-09-13 — End: 2023-09-13

## 2023-09-13 MED ORDER — DEXTROSE 10 % IV BOLUS
Freq: Once | INTRAVENOUS | Status: DC
Start: 2023-09-13 — End: 2023-09-14

## 2023-09-13 MED FILL — DEXTROSE 10 % IV SOLN: 10 % | INTRAVENOUS | Qty: 500

## 2023-09-13 MED FILL — AMLODIPINE BESYLATE 5 MG PO TABS: 5 MG | ORAL | Qty: 2

## 2023-09-13 MED FILL — PANTOPRAZOLE SODIUM 40 MG PO TBEC: 40 MG | ORAL | Qty: 1

## 2023-09-13 MED FILL — METOCLOPRAMIDE HCL 5 MG/ML IJ SOLN: 5 MG/ML | INTRAMUSCULAR | Qty: 2

## 2023-09-13 MED FILL — METOPROLOL SUCCINATE ER 50 MG PO TB24: 50 MG | ORAL | Qty: 2

## 2023-09-13 MED FILL — STOOL SOFTENER/LAXATIVE 50-8.6 MG PO TABS: ORAL | Qty: 2

## 2023-09-13 MED FILL — POLYETHYLENE GLYCOL 3350 17 G PO PACK: 17 g | ORAL | Qty: 1

## 2023-09-13 MED FILL — HYDROMORPHONE HCL 1 MG/ML IJ SOLN: 1 MG/ML | INTRAMUSCULAR | Qty: 1

## 2023-09-13 MED FILL — LISINOPRIL 20 MG PO TABS: 20 MG | ORAL | Qty: 2

## 2023-09-13 MED FILL — LANTUS 100 UNIT/ML SC SOLN: 100 UNIT/ML | SUBCUTANEOUS | Qty: 8

## 2023-09-13 MED FILL — AMITRIPTYLINE HCL 25 MG PO TABS: 25 MG | ORAL | Qty: 1

## 2023-09-13 MED FILL — SUCRALFATE 1 G PO TABS: 1 GM | ORAL | Qty: 1

## 2023-09-13 MED FILL — FLEET OIL RE ENEM: RECTAL | Qty: 1

## 2023-09-13 MED FILL — ENOXAPARIN SODIUM 40 MG/0.4ML IJ SOSY: 40 MG/0.4ML | INTRAMUSCULAR | Qty: 0.4

## 2023-09-13 MED FILL — TRAMADOL HCL 50 MG PO TABS: 50 MG | ORAL | Qty: 1

## 2023-09-13 NOTE — Care Coordination-Inpatient (Signed)
 Care Management Progress Note        Reason for Admission:   Tachycardia [R00.0]  Generalized abdominal pain [R10.84]  Hypertensive urgency [I16.0]         Patient Admission Status: Inpatient  RUR: 22%  Hospitalization in the last 30 days (Readmission):  N

## 2023-09-13 NOTE — Progress Notes (Signed)
 Devonia Farro, PA-C                       [phone] office             Monday-Friday 8:00 am-4:30 pm  I am not permitted to use "perfect serve" use above

## 2023-09-13 NOTE — Progress Notes (Signed)
 Hospitalist Progress Note      NAME:  Melinda [name]   DOB:  Feb 17, 1981  MRM:  [id number]    Date/Time: 09/13/2023  7:01 AM           Assessment / Plan:     Patient is a 42 year old female with a history of hypertension, GERD, hyperlipidemia, diab

## 2023-09-13 NOTE — Progress Notes (Signed)
 Spiritual Health History and Assessment/Progress Note  [facility]    Rituals, Rites and Sacraments,  ,  ,      Name: Melinda [name] MRN: [id number]    Age: 42 y.o.     Sex: female   Language: Spanish   Religion: Catholic   Hypertensi

## 2023-09-13 NOTE — Consults (Signed)
 Session ID: [id number]  Language: Spanish  Interpreter ID: #[id number]  Interpreter Name: Arvil Chaco

## 2023-09-13 NOTE — Consults (Signed)
 Session ID: [id number]  Language: Spanish  Interpreter ID: [phone]  Interpreter Name: Elita Quick

## 2023-09-13 NOTE — Progress Notes (Signed)
 Called patients PCP, office would like patient to call to schedule once they are discharged.

## 2023-09-13 NOTE — Consults (Signed)
 Session ID: [id number]  Language: Spanish  Interpreter ID: #[id number]  Interpreter Name: Elita Quick

## 2023-09-13 NOTE — Consults (Signed)
 Session ID: [id number]  Language: Spanish  Interpreter ID: #[id number]  Interpreter Name: Jonetta Speak

## 2023-09-13 NOTE — Plan of Care (Signed)
 Problem: Physical Therapy - Adult  Goal: By Discharge: Performs mobility at highest level of function for planned discharge setting.  See evaluation for individualized goals.  Description: FUNCTIONAL STATUS PRIOR TO ADMISSION: Pt reports indep LOF with m

## 2023-09-14 LAB — CBC WITH AUTO DIFFERENTIAL
Basophils %: 1 % (ref 0–1)
Basophils Absolute: 0.1 10*3/uL (ref 0.0–0.1)
Eosinophils %: 7 % (ref 0–7)
Eosinophils Absolute: 0.5 10*3/uL — ABNORMAL HIGH (ref 0.0–0.4)
Hematocrit: 28.1 % — ABNORMAL LOW (ref 35.0–47.0)
Hemoglobin: 9.2 g/dL — ABNORMAL LOW (ref 11.5–16.0)
Immature Granulocytes %: 0 % (ref 0.0–0.5)
Immature Granulocytes Absolute: 0 10*3/uL (ref 0.00–0.04)
Lymphocytes %: 31 % (ref 12–49)
Lymphocytes Absolute: 2.5 10*3/uL (ref 0.8–3.5)
MCH: 28.4 pg (ref 26.0–34.0)
MCHC: 32.7 g/dL (ref 30.0–36.5)
MCV: 86.7 fL (ref 80.0–99.0)
MPV: 9.2 fL (ref 8.9–12.9)
Monocytes %: 10 % (ref 5–13)
Monocytes Absolute: 0.8 10*3/uL (ref 0.0–1.0)
Neutrophils %: 51 % (ref 32–75)
Neutrophils Absolute: 4.1 10*3/uL (ref 1.8–8.0)
Nucleated RBCs: 0 /100{WBCs}
Platelets: 433 10*3/uL — ABNORMAL HIGH (ref 150–400)
RBC: 3.24 M/uL — ABNORMAL LOW (ref 3.80–5.20)
RDW: 13.2 % (ref 11.5–14.5)
WBC: 8 10*3/uL (ref 3.6–11.0)
nRBC: 0 10*3/uL (ref 0.00–0.01)

## 2023-09-14 LAB — BASIC METABOLIC PANEL
Anion Gap: 5 mmol/L (ref 2–12)
BUN/Creatinine Ratio: 6 — ABNORMAL LOW (ref 12–20)
BUN: 9 mg/dL (ref 6–20)
CO2: 23 mmol/L (ref 21–32)
Calcium: 8.1 mg/dL — ABNORMAL LOW (ref 8.5–10.1)
Chloride: 108 mmol/L (ref 97–108)
Creatinine: 1.46 mg/dL — ABNORMAL HIGH (ref 0.55–1.02)
Est, Glom Filt Rate: 46 mL/min/{1.73_m2} — ABNORMAL LOW (ref >60)
Glucose: 160 mg/dL — ABNORMAL HIGH (ref 65–100)
Potassium: 3.8 mmol/L (ref 3.5–5.1)
Sodium: 136 mmol/L (ref 136–145)

## 2023-09-14 LAB — POCT GLUCOSE
POC Glucose: 162 mg/dL — ABNORMAL HIGH (ref 65–117)
POC Glucose: 141 mg/dL — ABNORMAL HIGH (ref 65–117)
POC Glucose: 163 mg/dL — ABNORMAL HIGH (ref 65–117)

## 2023-09-14 MED ORDER — DICYCLOMINE HCL 20 MG PO TABS
20 | ORAL_TABLET | Freq: Four times a day (QID) | ORAL | 0 refills | Status: DC | PRN
Start: 2023-09-14 — End: 2024-07-02

## 2023-09-14 MED ORDER — SENNA-DOCUSATE SODIUM 8.6-50 MG PO TABS
8.6-50 MG | ORAL_TABLET | Freq: Two times a day (BID) | ORAL | 0 refills | Status: AC
Start: 2023-09-14 — End: 2023-09-22

## 2023-09-14 MED ORDER — AMITRIPTYLINE HCL 10 MG PO TABS
10 | ORAL_TABLET | Freq: Every evening | ORAL | 0 refills | 30.00000 days | Status: DC
Start: 2023-09-14 — End: 2024-03-07

## 2023-09-14 MED ORDER — PRUCALOPRIDE SUCCINATE 2 MG PO TABS
2 MG | ORAL_TABLET | Freq: Every day | ORAL | 0 refills | Status: AC
Start: 2023-09-14 — End: 2023-09-22

## 2023-09-14 MED ORDER — METOCLOPRAMIDE HCL 10 MG PO TABS
10 | ORAL_TABLET | Freq: Four times a day (QID) | ORAL | 0 refills | 28.00 days | Status: AC | PRN
Start: 2023-09-14 — End: 2023-10-14

## 2023-09-14 MED ORDER — INSULIN NPH ISOPHANE & REGULAR (70-30) 100 UNIT/ML SC SUSP
Freq: Two times a day (BID) | SUBCUTANEOUS | 1 refills | Status: DC
Start: 2023-09-14 — End: 2023-09-14

## 2023-09-14 MED ORDER — POLYETHYLENE GLYCOL 3350 17 GM/SCOOP PO POWD
17 | Freq: Two times a day (BID) | ORAL | 1 refills | 27.00 days | Status: AC
Start: 2023-09-14 — End: 2024-03-12

## 2023-09-14 MED FILL — STOOL SOFTENER/LAXATIVE 50-8.6 MG PO TABS: ORAL | Qty: 2

## 2023-09-14 MED FILL — HYDROMORPHONE HCL 1 MG/ML IJ SOLN: 1 MG/ML | INTRAMUSCULAR | Qty: 1

## 2023-09-14 MED FILL — POLYETHYLENE GLYCOL 3350 17 G PO PACK: 17 g | ORAL | Qty: 1

## 2023-09-14 MED FILL — ENOXAPARIN SODIUM 40 MG/0.4ML IJ SOSY: 40 MG/0.4ML | INTRAMUSCULAR | Qty: 0.4

## 2023-09-14 MED FILL — PANTOPRAZOLE SODIUM 40 MG PO TBEC: 40 MG | ORAL | Qty: 1

## 2023-09-14 MED FILL — AMLODIPINE BESYLATE 5 MG PO TABS: 5 MG | ORAL | Qty: 2

## 2023-09-14 MED FILL — METOPROLOL SUCCINATE ER 50 MG PO TB24: 50 MG | ORAL | Qty: 2

## 2023-09-14 MED FILL — METOCLOPRAMIDE HCL 5 MG/ML IJ SOLN: 5 MG/ML | INTRAMUSCULAR | Qty: 2

## 2023-09-14 MED FILL — AMITRIPTYLINE HCL 25 MG PO TABS: 25 MG | ORAL | Qty: 1

## 2023-09-14 MED FILL — LISINOPRIL 20 MG PO TABS: 20 MG | ORAL | Qty: 2

## 2023-09-14 NOTE — Progress Notes (Signed)
 Physician Progress Note      PATIENT:               Melinda [name], Melinda [name]  CSN #:                  [id number]  DOB:                       [birth year]  ADMIT DATE:       09/06/2023 1:45 PM  DISCH DATE:        09/14/2023 1:58 PM  RESPONDING  PROVIDER #:        And

## 2023-09-14 NOTE — Plan of Care (Signed)
 Problem: Chronic Conditions and Co-morbidities  Goal: Patient's chronic conditions and co-morbidity symptoms are monitored and maintained or improved  09/14/2023 1121 by Donah Driver, RN  Outcome: Progressing  Flowsheets (Taken 09/13/2023 2015 by Montez Morita

## 2023-09-14 NOTE — Plan of Care (Signed)
 Problem: Chronic Conditions and Co-morbidities  Goal: Patient's chronic conditions and co-morbidity symptoms are monitored and maintained or improved  Outcome: Progressing  Flowsheets (Taken 09/13/2023 2015)  Care Plan - Patient's Chronic Conditions and C

## 2023-09-14 NOTE — Discharge Summary (Signed)
 Patient ID:  Melinda [name]  [id number]  42 y.o.  [birth year]    Admit date: 09/06/2023    Discharge date and time: 09/14/2023    Admission Diagnoses: Tachycardia [R00.0]  Generalized abdominal pain [R10.84]  Hypertensive urgency [I16.0]    Discharge D

## 2023-09-14 NOTE — Care Coordination-Inpatient (Signed)
 Care Management Progress Note        Reason for Admission:   Tachycardia [R00.0]  Generalized abdominal pain [R10.84]  Hypertensive urgency [I16.0]         Patient Admission Status: Inpatient  RUR: 22%  Hospitalization in the last 30 days (Readmission):  N

## 2023-09-17 ENCOUNTER — Emergency Department: Admit: 2023-09-17 | Payer: MEDICAID | Primary: Adult Health

## 2023-09-17 ENCOUNTER — Inpatient Hospital Stay
Admission: EM | Admit: 2023-09-17 | Discharge: 2023-09-22 | Disposition: A | Discharge status: Home / Self Care | Payer: MEDICAID | Admitting: Internal Medicine

## 2023-09-17 DIAGNOSIS — K920 Hematemesis: Secondary | ICD-10-CM

## 2023-09-17 DIAGNOSIS — K859 Acute pancreatitis without necrosis or infection, unspecified: Principal | ICD-10-CM

## 2023-09-17 LAB — CBC WITH AUTO DIFFERENTIAL
Basophils %: 1 % (ref 0–1)
Basophils Absolute: 0.1 10*3/uL (ref 0.0–0.1)
Eosinophils %: 3 % (ref 0–7)
Eosinophils Absolute: 0.3 10*3/uL (ref 0.0–0.4)
Hematocrit: 34.2 % — ABNORMAL LOW (ref 35.0–47.0)
Hemoglobin: 11.5 g/dL (ref 11.5–16.0)
Immature Granulocytes %: 0 % (ref 0.0–0.5)
Immature Granulocytes Absolute: 0 10*3/uL (ref 0.00–0.04)
Lymphocytes %: 19 % (ref 12–49)
Lymphocytes Absolute: 2.1 10*3/uL (ref 0.8–3.5)
MCH: 28.8 pg (ref 26.0–34.0)
MCHC: 33.6 g/dL (ref 30.0–36.5)
MCV: 85.7 fL (ref 80.0–99.0)
MPV: 9.1 fL (ref 8.9–12.9)
Monocytes %: 6 % (ref 5–13)
Monocytes Absolute: 0.7 10*3/uL (ref 0.0–1.0)
Neutrophils %: 71 % (ref 32–75)
Neutrophils Absolute: 7.9 10*3/uL (ref 1.8–8.0)
Nucleated RBCs: 0 /100{WBCs}
Platelets: 487 10*3/uL — ABNORMAL HIGH (ref 150–400)
RBC: 3.99 M/uL (ref 3.80–5.20)
RDW: 13.5 % (ref 11.5–14.5)
WBC: 11.2 10*3/uL — ABNORMAL HIGH (ref 3.6–11.0)
nRBC: 0 10*3/uL (ref 0.00–0.01)

## 2023-09-17 LAB — URINALYSIS WITH MICROSCOPIC
Bilirubin, Urine: NEGATIVE
Glucose, Ur: 500 mg/dL — AB
Ketones, Urine: NEGATIVE mg/dL
Nitrite, Urine: NEGATIVE
Protein, UA: >300 mg/dL — AB
RBC, UA: >100 /[HPF] — ABNORMAL HIGH (ref 0–5)
Specific Gravity, UA: 1.018 (ref 1.003–1.030)
Urobilinogen, Urine: 0.2 U/dL (ref 0.2–1.0)
pH, Urine: 6.5 (ref 5.0–8.0)

## 2023-09-17 LAB — TYPE AND SCREEN
ABO/Rh: O POS
Antibody Screen: NEGATIVE

## 2023-09-17 LAB — COMPREHENSIVE METABOLIC PANEL
ALT: 41 U/L (ref 12–78)
AST: 41 U/L — ABNORMAL HIGH (ref 15–37)
Albumin/Globulin Ratio: 0.6 — ABNORMAL LOW (ref 1.1–2.2)
Albumin: 2.6 g/dL — ABNORMAL LOW (ref 3.5–5.0)
Alk Phosphatase: 142 U/L — ABNORMAL HIGH (ref 45–117)
Anion Gap: 9 mmol/L (ref 2–12)
BUN/Creatinine Ratio: 9 — ABNORMAL LOW (ref 12–20)
BUN: 15 mg/dL (ref 6–20)
CO2: 21 mmol/L (ref 21–32)
Calcium: 9.2 mg/dL (ref 8.5–10.1)
Chloride: 109 mmol/L — ABNORMAL HIGH (ref 97–108)
Creatinine: 1.71 mg/dL — ABNORMAL HIGH (ref 0.55–1.02)
Est, Glom Filt Rate: 38 mL/min/{1.73_m2} — ABNORMAL LOW (ref >60)
Globulin: 4.5 g/dL — ABNORMAL HIGH (ref 2.0–4.0)
Glucose: 206 mg/dL — ABNORMAL HIGH (ref 65–100)
Potassium: 4 mmol/L (ref 3.5–5.1)
Sodium: 139 mmol/L (ref 136–145)
Total Bilirubin: 0.2 mg/dL (ref 0.2–1.0)
Total Protein: 7.1 g/dL (ref 6.4–8.2)

## 2023-09-17 LAB — URINE CULTURE HOLD SAMPLE

## 2023-09-17 LAB — LIPASE: Lipase: 35 U/L (ref 13–75)

## 2023-09-17 LAB — HCG, SERUM, QUALITATIVE: Preg, Serum: NEGATIVE

## 2023-09-17 MED ORDER — ONDANSETRON HCL 4 MG/2ML IJ SOLN
4 | INTRAMUSCULAR | Status: AC | PRN
Start: 2023-09-17 — End: 2023-09-17
  Administered 2023-09-17 (×2): 4 mg via INTRAVENOUS

## 2023-09-17 MED ORDER — HYDROMORPHONE HCL PF 1 MG/ML IJ SOLN
1 | INTRAMUSCULAR | Status: AC | PRN
Start: 2023-09-17 — End: 2023-09-17
  Administered 2023-09-17 – 2023-09-18 (×2): 1 mg via INTRAVENOUS

## 2023-09-17 MED ORDER — ONDANSETRON HCL 4 MG/2ML IJ SOLN
4 | Freq: Once | INTRAMUSCULAR | Status: DC
Start: 2023-09-17 — End: 2023-09-17

## 2023-09-17 MED ORDER — SODIUM CHLORIDE (PF) 0.9 % IJ SOLN
0.9 | Freq: Once | INTRAMUSCULAR | Status: AC
Start: 2023-09-17 — End: 2023-09-17
  Administered 2023-09-17: 20:00:00 80 mg via INTRAVENOUS

## 2023-09-17 MED ORDER — HALOPERIDOL LACTATE 5 MG/ML IJ SOLN
5 | Freq: Once | INTRAMUSCULAR | Status: AC
Start: 2023-09-17 — End: 2023-09-17
  Administered 2023-09-17: 21:00:00 5 mg via INTRAVENOUS

## 2023-09-17 MED ORDER — DIPHENHYDRAMINE HCL 50 MG/ML IJ SOLN
50 | INTRAMUSCULAR | Status: AC
Start: 2023-09-17 — End: 2023-09-17
  Administered 2023-09-17: 21:00:00 25 mg via INTRAVENOUS

## 2023-09-17 MED ORDER — SODIUM CHLORIDE 0.9 % IV BOLUS
0.9 | Freq: Once | INTRAVENOUS | Status: AC
Start: 2023-09-17 — End: 2023-09-17
  Administered 2023-09-17: 20:00:00 1000 mL via INTRAVENOUS

## 2023-09-17 MED ORDER — IOPAMIDOL 76 % IV SOLN
76 | Freq: Once | INTRAVENOUS | Status: AC | PRN
Start: 2023-09-17 — End: 2023-09-17
  Administered 2023-09-17: 23:00:00 100 mL via INTRAVENOUS

## 2023-09-17 MED FILL — DIPHENHYDRAMINE HCL 50 MG/ML IJ SOLN: 50 MG/ML | INTRAMUSCULAR | Qty: 1 | Fill #0

## 2023-09-17 MED FILL — HYDROMORPHONE HCL 1 MG/ML IJ SOLN: 1 MG/ML | INTRAMUSCULAR | Qty: 1 | Fill #0

## 2023-09-17 MED FILL — ONDANSETRON HCL 4 MG/2ML IJ SOLN: 4 MG/2ML | INTRAMUSCULAR | Qty: 2 | Fill #0

## 2023-09-17 MED FILL — ISOVUE-370 76 % IV SOLN: 76 % | INTRAVENOUS | Qty: 100 | Fill #0

## 2023-09-17 MED FILL — PANTOPRAZOLE SODIUM 40 MG IV SOLR: 40 MG | INTRAVENOUS | Qty: 80 | Fill #0

## 2023-09-17 MED FILL — SODIUM CHLORIDE 0.9 % IV SOLN: 0.9 % | INTRAVENOUS | Qty: 1000 | Fill #0

## 2023-09-17 MED FILL — HALOPERIDOL LACTATE 5 MG/ML IJ SOLN: 5 MG/ML | INTRAMUSCULAR | Qty: 1 | Fill #0

## 2023-09-17 NOTE — H&P (Signed)
 History and Physical    Date of Service:  09/18/2023  Primary Care Provider: Orlean Bradford, APRN - NP  Source of information: The patient through language line because patient is Spanish-speaking only and review of EMR    Chief Complaint: Vomit

## 2023-09-17 NOTE — ED Triage Notes (Addendum)
Pt ambulatory to ED c/o vomiting x 2 days with abdominal pain. Blood present in emesis starting today. Denies sick contact. Hx of DM2 and HTN.

## 2023-09-17 NOTE — ED Notes (Signed)
MD aware of patient's elevated blood pressures and heart rate. New orders pending verification. Will continue to monitor closely.

## 2023-09-17 NOTE — ED Notes (Signed)
Notified MD of patient's elevated blood pressures again. No new orders at this time. Will continue to monitor.

## 2023-09-17 NOTE — ED Provider Notes (Signed)
 [facility] EMERGENCY DEPT  EMERGENCY DEPARTMENT ENCOUNTER      Pt Name: Melinda [name]  MRN: [id number]  Birthdate [birth year]  Date of evaluation: 09/17/2023  Provider: Truett Mainland, MD    CHIEF COMPLAINT       Chief Complaint   Patient presents with

## 2023-09-17 NOTE — ED Notes (Signed)
SBAR report given to Adriene, RN to the 5th floor for patient's routine progression of care.

## 2023-09-17 NOTE — ED Notes (Signed)
Report given to Faith,RN

## 2023-09-17 NOTE — Consults (Signed)
Session ID: [id number]  Language: Spanish  Interpreter ID: #[id number]  Interpreter Name: Gavin Pound

## 2023-09-17 NOTE — ED Notes (Signed)
 Patient care handed off to me from Dr. Clydene Pugh.  Patient was evaluated for history of vomiting and possible hematemesis.  Her hemoglobin is stable.  CT scan resulted with signs of likely esophagitis and gastritis.  She has been given fluids, pain medicine an

## 2023-09-18 ENCOUNTER — Inpatient Hospital Stay: Admit: 2023-09-18 | Payer: MEDICAID | Primary: Adult Health

## 2023-09-18 LAB — POCT GLUCOSE
POC Glucose: 166 mg/dL — ABNORMAL HIGH (ref 65–117)
POC Glucose: 189 mg/dL — ABNORMAL HIGH (ref 65–117)
POC Glucose: 240 mg/dL — ABNORMAL HIGH (ref 65–117)
POC Glucose: 138 mg/dL — ABNORMAL HIGH (ref 65–117)

## 2023-09-18 LAB — COMPREHENSIVE METABOLIC PANEL
ALT: 32 U/L (ref 12–78)
AST: 31 U/L (ref 15–37)
Albumin/Globulin Ratio: 0.6 — ABNORMAL LOW (ref 1.1–2.2)
Albumin: 2 g/dL — ABNORMAL LOW (ref 3.5–5.0)
Alk Phosphatase: 99 U/L (ref 45–117)
Anion Gap: 9 mmol/L (ref 2–12)
BUN/Creatinine Ratio: 8 — ABNORMAL LOW (ref 12–20)
BUN: 13 mg/dL (ref 6–20)
CO2: 17 mmol/L — ABNORMAL LOW (ref 21–32)
Calcium: 8 mg/dL — ABNORMAL LOW (ref 8.5–10.1)
Chloride: 115 mmol/L — ABNORMAL HIGH (ref 97–108)
Creatinine: 1.58 mg/dL — ABNORMAL HIGH (ref 0.55–1.02)
Est, Glom Filt Rate: 42 mL/min/{1.73_m2} — ABNORMAL LOW (ref >60)
Globulin: 3.5 g/dL (ref 2.0–4.0)
Glucose: 164 mg/dL — ABNORMAL HIGH (ref 65–100)
Potassium: 3.8 mmol/L (ref 3.5–5.1)
Sodium: 141 mmol/L (ref 136–145)
Total Bilirubin: 0.2 mg/dL (ref 0.2–1.0)
Total Protein: 5.5 g/dL — ABNORMAL LOW (ref 6.4–8.2)

## 2023-09-18 LAB — CBC WITH AUTO DIFFERENTIAL
Basophils %: 1 % (ref 0–1)
Basophils Absolute: 0.1 10*3/uL (ref 0.0–0.1)
Eosinophils %: 3 % (ref 0–7)
Eosinophils Absolute: 0.3 10*3/uL (ref 0.0–0.4)
Hematocrit: 30.2 % — ABNORMAL LOW (ref 35.0–47.0)
Hemoglobin: 9.6 g/dL — ABNORMAL LOW (ref 11.5–16.0)
Immature Granulocytes %: 0 % (ref 0.0–0.5)
Immature Granulocytes Absolute: 0 10*3/uL (ref 0.00–0.04)
Lymphocytes %: 16 % (ref 12–49)
Lymphocytes Absolute: 1.4 10*3/uL (ref 0.8–3.5)
MCH: 28.6 pg (ref 26.0–34.0)
MCHC: 31.8 g/dL (ref 30.0–36.5)
MCV: 89.9 fL (ref 80.0–99.0)
MPV: 8.7 fL — ABNORMAL LOW (ref 8.9–12.9)
Monocytes %: 6 % (ref 5–13)
Monocytes Absolute: 0.5 10*3/uL (ref 0.0–1.0)
Neutrophils %: 74 % (ref 32–75)
Neutrophils Absolute: 6.6 10*3/uL (ref 1.8–8.0)
Nucleated RBCs: 0 /100{WBCs}
Platelets: 365 10*3/uL (ref 150–400)
RBC: 3.36 M/uL — ABNORMAL LOW (ref 3.80–5.20)
RDW: 14 % (ref 11.5–14.5)
WBC: 8.9 10*3/uL (ref 3.6–11.0)
nRBC: 0 10*3/uL (ref 0.00–0.01)

## 2023-09-18 LAB — MAGNESIUM: Magnesium: 1.6 mg/dL (ref 1.6–2.4)

## 2023-09-18 LAB — TROPONIN: Troponin, High Sensitivity: 18 ng/L (ref 0–51)

## 2023-09-18 LAB — HEMOGLOBIN A1C
Estimated Avg Glucose: 163 mg/dL
Hemoglobin A1C: 7.3 % — ABNORMAL HIGH (ref 4.0–5.6)

## 2023-09-18 LAB — PHOSPHORUS: Phosphorus: 3.7 mg/dL (ref 2.6–4.7)

## 2023-09-18 LAB — LIPASE: Lipase: 16 U/L (ref 13–75)

## 2023-09-18 LAB — TSH: TSH, 3rd Generation: 4.91 u[IU]/mL — ABNORMAL HIGH (ref 0.36–3.74)

## 2023-09-18 MED ORDER — AMITRIPTYLINE HCL 10 MG PO TABS
10 MG | Freq: Every evening | ORAL | Status: AC
Start: 2023-09-18 — End: 2023-09-22
  Administered 2023-09-18 – 2023-09-22 (×5): 10 mg via ORAL

## 2023-09-18 MED ORDER — SODIUM CHLORIDE (PF) 0.9 % IJ SOLN
0.9 % | Freq: Two times a day (BID) | INTRAMUSCULAR | Status: AC
Start: 2023-09-18 — End: 2023-09-22
  Administered 2023-09-18 – 2023-09-22 (×10): 40 mg via INTRAVENOUS

## 2023-09-18 MED ORDER — POLYETHYLENE GLYCOL 3350 17 G PO PACK
17 g | Freq: Every day | ORAL | Status: AC | PRN
Start: 2023-09-18 — End: 2023-09-22

## 2023-09-18 MED ORDER — NORMAL SALINE FLUSH 0.9 % IV SOLN
0.9 % | INTRAVENOUS | Status: AC | PRN
Start: 2023-09-18 — End: 2023-09-22

## 2023-09-18 MED ORDER — LISINOPRIL 20 MG PO TABS
20 MG | Freq: Every day | ORAL | Status: AC
Start: 2023-09-18 — End: 2023-09-22
  Administered 2023-09-18 – 2023-09-22 (×5): 40 mg via ORAL

## 2023-09-18 MED ORDER — ACETAMINOPHEN 650 MG RE SUPP
650 | Freq: Four times a day (QID) | RECTAL | Status: DC | PRN
Start: 2023-09-18 — End: 2023-09-22

## 2023-09-18 MED ORDER — ONDANSETRON HCL 4 MG/2ML IJ SOLN
42 MG/2ML | Freq: Four times a day (QID) | INTRAMUSCULAR | Status: AC | PRN
Start: 2023-09-18 — End: 2023-09-22
  Administered 2023-09-18 – 2023-09-20 (×3): 4 mg via INTRAVENOUS

## 2023-09-18 MED ORDER — DICYCLOMINE HCL 20 MG PO TABS
20 MG | Freq: Four times a day (QID) | ORAL | Status: AC | PRN
Start: 2023-09-18 — End: 2023-09-22
  Administered 2023-09-18 – 2023-09-20 (×4): 20 mg via ORAL

## 2023-09-18 MED ORDER — LORAZEPAM 2 MG/ML IJ SOLN
2 MG/ML | Freq: Four times a day (QID) | INTRAMUSCULAR | Status: AC | PRN
Start: 2023-09-18 — End: 2023-09-22
  Administered 2023-09-18 – 2023-09-21 (×5): 1 mg via INTRAVENOUS

## 2023-09-18 MED ORDER — METOCLOPRAMIDE HCL 5 MG/ML IJ SOLN
5 MG/ML | Freq: Four times a day (QID) | INTRAMUSCULAR | Status: AC
Start: 2023-09-18 — End: 2023-09-22
  Administered 2023-09-18 – 2023-09-22 (×17): 10 mg via INTRAVENOUS

## 2023-09-18 MED ORDER — POTASSIUM CHLORIDE 10 MEQ/100ML IV SOLN
10100 MEQ/0ML | INTRAVENOUS | Status: AC | PRN
Start: 2023-09-18 — End: 2023-09-22

## 2023-09-18 MED ORDER — CEFTRIAXONE SODIUM 1 G IJ SOLR
1 | INTRAMUSCULAR | Status: AC
Start: 2023-09-18 — End: 2023-09-20
  Administered 2023-09-18 – 2023-09-20 (×3): 1000 mg via INTRAVENOUS

## 2023-09-18 MED ORDER — ONDANSETRON 4 MG PO TBDP
4 MG | Freq: Three times a day (TID) | ORAL | Status: AC | PRN
Start: 2023-09-18 — End: 2023-09-22
  Administered 2023-09-19: 14:00:00 4 mg via ORAL

## 2023-09-18 MED ORDER — SODIUM CHLORIDE 0.9 % IV BOLUS
0.9 | Freq: Once | INTRAVENOUS | Status: AC
Start: 2023-09-18 — End: 2023-09-17
  Administered 2023-09-18: 01:00:00 1000 mL via INTRAVENOUS

## 2023-09-18 MED ORDER — POTASSIUM CHLORIDE ER 10 MEQ PO TBCR
10 MEQ | ORAL | Status: AC | PRN
Start: 2023-09-18 — End: 2023-09-22

## 2023-09-18 MED ORDER — PRUCALOPRIDE SUCCINATE 2 MG PO TABS
2 | Freq: Every day | ORAL | Status: AC
Start: 2023-09-18 — End: ?

## 2023-09-18 MED ORDER — SODIUM CHLORIDE 0.9 % IV SOLN
0.9 % | INTRAVENOUS | Status: AC
Start: 2023-09-18 — End: 2023-09-22
  Administered 2023-09-18: 05:00:00 via INTRAVENOUS

## 2023-09-18 MED ORDER — INSULIN LISPRO 100 UNIT/ML IJ SOLN
100 UNIT/ML | Freq: Four times a day (QID) | INTRAMUSCULAR | Status: AC
Start: 2023-09-18 — End: 2023-09-22
  Administered 2023-09-18 – 2023-09-22 (×6): 2 [IU] via SUBCUTANEOUS

## 2023-09-18 MED ORDER — AMLODIPINE BESYLATE 5 MG PO TABS
5 MG | Freq: Every day | ORAL | Status: AC
Start: 2023-09-18 — End: 2023-09-22
  Administered 2023-09-18 – 2023-09-22 (×5): 10 mg via ORAL

## 2023-09-18 MED ORDER — POTASSIUM BICARB-CITRIC ACID 20 MEQ PO TBEF
20 MEQ | ORAL | Status: AC | PRN
Start: 2023-09-18 — End: 2023-09-22

## 2023-09-18 MED ORDER — LABETALOL HCL 5 MG/ML IV SOLN
5 | INTRAVENOUS | Status: AC
Start: 2023-09-18 — End: 2023-09-17
  Administered 2023-09-18: 02:00:00 20 mg via INTRAVENOUS

## 2023-09-18 MED ORDER — METOPROLOL SUCCINATE ER 25 MG PO TB24
25 MG | Freq: Every day | ORAL | Status: AC
Start: 2023-09-18 — End: 2023-09-22
  Administered 2023-09-18 – 2023-09-22 (×5): 50 mg via ORAL

## 2023-09-18 MED ORDER — NORMAL SALINE FLUSH 0.9 % IV SOLN
0.9 % | Freq: Two times a day (BID) | INTRAVENOUS | Status: AC
Start: 2023-09-18 — End: 2023-09-22
  Administered 2023-09-18 – 2023-09-22 (×9): 10 mL via INTRAVENOUS

## 2023-09-18 MED ORDER — MAGNESIUM SULFATE 2000 MG/50 ML IVPB PREMIX
250 GM/50ML | INTRAVENOUS | Status: AC | PRN
Start: 2023-09-18 — End: 2023-09-22

## 2023-09-18 MED ORDER — OXYCODONE HCL 5 MG PO TABS
5 | ORAL | Status: DC | PRN
Start: 2023-09-18 — End: 2023-09-19
  Administered 2023-09-18 – 2023-09-19 (×5): 5 mg via ORAL

## 2023-09-18 MED ORDER — SODIUM CHLORIDE 0.9 % IV SOLN
0.9 % | INTRAVENOUS | Status: AC | PRN
Start: 2023-09-18 — End: 2023-09-22

## 2023-09-18 MED ORDER — ACETAMINOPHEN 325 MG PO TABS
325 | Freq: Four times a day (QID) | ORAL | Status: DC | PRN
Start: 2023-09-18 — End: 2023-09-22
  Administered 2023-09-19: 23:00:00 650 mg via ORAL

## 2023-09-18 MED FILL — OXYCODONE HCL 5 MG PO TABS: 5 MG | ORAL | Qty: 1 | Fill #0

## 2023-09-18 MED FILL — METOCLOPRAMIDE HCL 5 MG/ML IJ SOLN: 5 MG/ML | INTRAMUSCULAR | Qty: 2 | Fill #0

## 2023-09-18 MED FILL — AMLODIPINE BESYLATE 5 MG PO TABS: 5 MG | ORAL | Qty: 2 | Fill #0

## 2023-09-18 MED FILL — LORAZEPAM 2 MG/ML IJ SOLN: 2 MG/ML | INTRAMUSCULAR | Qty: 1 | Fill #0

## 2023-09-18 MED FILL — SODIUM CHLORIDE 0.9 % IV SOLN: 0.9 % | INTRAVENOUS | Qty: 1000 | Fill #0

## 2023-09-18 MED FILL — PANTOPRAZOLE SODIUM 40 MG IV SOLR: 40 MG | INTRAVENOUS | Qty: 40 | Fill #0

## 2023-09-18 MED FILL — LISINOPRIL 20 MG PO TABS: 20 MG | ORAL | Qty: 2 | Fill #0

## 2023-09-18 MED FILL — DICYCLOMINE HCL 20 MG PO TABS: 20 MG | ORAL | Qty: 1 | Fill #0

## 2023-09-18 MED FILL — HYDROMORPHONE HCL 1 MG/ML IJ SOLN: 1 MG/ML | INTRAMUSCULAR | Qty: 1 | Fill #0

## 2023-09-18 MED FILL — ONDANSETRON HCL 4 MG/2ML IJ SOLN: 4 MG/2ML | INTRAMUSCULAR | Qty: 2 | Fill #0

## 2023-09-18 MED FILL — METOPROLOL SUCCINATE ER 50 MG PO TB24: 50 MG | ORAL | Qty: 1 | Fill #0

## 2023-09-18 MED FILL — BD POSIFLUSH 0.9 % IV SOLN: 0.9 % | INTRAVENOUS | Qty: 40 | Fill #0

## 2023-09-18 MED FILL — INSULIN LISPRO 100 UNIT/ML IJ SOLN: 100 UNIT/ML | INTRAMUSCULAR | Qty: 2 | Fill #0

## 2023-09-18 MED FILL — CEFTRIAXONE SODIUM 1 G IJ SOLR: 1 g | INTRAMUSCULAR | Qty: 1000 | Fill #0

## 2023-09-18 MED FILL — LABETALOL HCL 5 MG/ML IV SOLN: 5 MG/ML | INTRAVENOUS | Qty: 4 | Fill #0

## 2023-09-18 MED FILL — AMITRIPTYLINE HCL 10 MG PO TABS: 10 MG | ORAL | Qty: 1 | Fill #0

## 2023-09-18 NOTE — Progress Notes (Signed)
Attempted to draw AM labs along with charge RN. Attempts unsuccessful. Will notify oncoming AM RN.

## 2023-09-18 NOTE — Consults (Signed)
.                          Glyn Ade, MD  [phone] office    I am not permitted to use "perfect serve" use above for contact, thanks.   Gastroenterology Consultation Note      Admit Date: 09/17/2023  Consult Date: 09/18/2023   I greatly appreciate your

## 2023-09-18 NOTE — ED Notes (Signed)
SBAR report given to [facility], RN to [facility] for patient's routine progression of care.

## 2023-09-18 NOTE — Progress Notes (Signed)
 Spiritual Health History and Assessment/Progress Note  [facility]    Attempted Encounter,  ,  ,      Name: Melinda [name] MRN: [id number]    Age: 42 y.o.     Sex: female   Language: Spanish   Religion: Catholic   Hematemesis with nau

## 2023-09-18 NOTE — Progress Notes (Addendum)
 [place] [facility]  [address] [facility] [place], [place] [address]  [phone]      Hospitalist Progress Note      NAME: Laurence Folz   DOB:  Feb 11, 1981  MRM:  [id number]    Date of service: 09/18/2023  10:48 AM       Assessmen

## 2023-09-18 NOTE — Consults (Signed)
Session ID: [id number]  Language: Spanish  Interpreter ID: #[id number]  Interpreter Name: Clerance Lav

## 2023-09-19 ENCOUNTER — Inpatient Hospital Stay: Admit: 2023-09-19 | Payer: MEDICAID | Primary: Adult Health

## 2023-09-19 LAB — BASIC METABOLIC PANEL
Anion Gap: 7 mmol/L (ref 2–12)
BUN/Creatinine Ratio: 6 — ABNORMAL LOW (ref 12–20)
BUN: 11 mg/dL (ref 6–20)
CO2: 22 mmol/L (ref 21–32)
Calcium: 8.1 mg/dL — ABNORMAL LOW (ref 8.5–10.1)
Chloride: 112 mmol/L — ABNORMAL HIGH (ref 97–108)
Creatinine: 1.9 mg/dL — ABNORMAL HIGH (ref 0.55–1.02)
Est, Glom Filt Rate: 34 mL/min/{1.73_m2} — ABNORMAL LOW (ref >60)
Glucose: 116 mg/dL — ABNORMAL HIGH (ref 65–100)
Potassium: 3.7 mmol/L (ref 3.5–5.1)
Sodium: 141 mmol/L (ref 136–145)

## 2023-09-19 LAB — CBC WITH AUTO DIFFERENTIAL
Basophils %: 1 % (ref 0–1)
Basophils Absolute: 0.1 10*3/uL (ref 0.0–0.1)
Eosinophils %: 7 % (ref 0–7)
Eosinophils Absolute: 0.5 10*3/uL — ABNORMAL HIGH (ref 0.0–0.4)
Hematocrit: 27.1 % — ABNORMAL LOW (ref 35.0–47.0)
Hemoglobin: 8.7 g/dL — ABNORMAL LOW (ref 11.5–16.0)
Immature Granulocytes %: 0 % (ref 0.0–0.5)
Immature Granulocytes Absolute: 0 10*3/uL (ref 0.00–0.04)
Lymphocytes %: 30 % (ref 12–49)
Lymphocytes Absolute: 2.4 10*3/uL (ref 0.8–3.5)
MCH: 28.4 pg (ref 26.0–34.0)
MCHC: 32.1 g/dL (ref 30.0–36.5)
MCV: 88.6 fL (ref 80.0–99.0)
MPV: 8.6 fL — ABNORMAL LOW (ref 8.9–12.9)
Monocytes %: 9 % (ref 5–13)
Monocytes Absolute: 0.7 10*3/uL (ref 0.0–1.0)
Neutrophils %: 53 % (ref 32–75)
Neutrophils Absolute: 4.1 10*3/uL (ref 1.8–8.0)
Nucleated RBCs: 0 /100{WBCs}
Platelets: 359 10*3/uL (ref 150–400)
RBC: 3.06 M/uL — ABNORMAL LOW (ref 3.80–5.20)
RDW: 13.9 % (ref 11.5–14.5)
WBC: 7.8 10*3/uL (ref 3.6–11.0)
nRBC: 0 10*3/uL (ref 0.00–0.01)

## 2023-09-19 LAB — POCT GLUCOSE
POC Glucose: 202 mg/dL — ABNORMAL HIGH (ref 65–117)
POC Glucose: 202 mg/dL — ABNORMAL HIGH (ref 65–117)
POC Glucose: 130 mg/dL — ABNORMAL HIGH (ref 65–117)
POC Glucose: 230 mg/dL — ABNORMAL HIGH (ref 65–117)

## 2023-09-19 LAB — EKG 12-LEAD
Atrial Rate: 136 {beats}/min
Atrial Rate: 99 {beats}/min
Diagnosis: NORMAL
P Axis: 45 degrees
P Axis: 71 degrees
P-R Interval: 122 ms
P-R Interval: 154 ms
Q-T Interval: 382 ms
Q-T Interval: 386 ms
QRS Duration: 76 ms
QRS Duration: 82 ms
QTc Calculation (Bazett): 495 ms
QTc Calculation (Bazett): 574 ms
R Axis: -81 degrees
R Axis: 235 degrees
T Axis: 60 degrees
T Axis: 61 degrees
Ventricular Rate: 136 {beats}/min
Ventricular Rate: 99 {beats}/min

## 2023-09-19 LAB — LIPASE: Lipase: 18 U/L (ref 13–75)

## 2023-09-19 MED ORDER — HYDROMORPHONE HCL PF 1 MG/ML IJ SOLN
1 | INTRAMUSCULAR | Status: DC | PRN
Start: 2023-09-19 — End: 2023-09-22
  Administered 2023-09-19 – 2023-09-22 (×6): 1 mg via INTRAVENOUS

## 2023-09-19 MED ORDER — PEG 3350-KCL-NABCB-NACL-NASULF 236 G PO SOLR
236 | Freq: Once | ORAL | Status: AC
Start: 2023-09-19 — End: 2023-09-19
  Administered 2023-09-19: 14:00:00 2000 mL via ORAL

## 2023-09-19 MED ORDER — HYDRALAZINE HCL 20 MG/ML IJ SOLN
20 | Freq: Four times a day (QID) | INTRAMUSCULAR | Status: DC | PRN
Start: 2023-09-19 — End: 2023-09-22
  Administered 2023-09-19 – 2023-09-20 (×3): 10 mg via INTRAVENOUS

## 2023-09-19 MED ORDER — MORPHINE SULFATE (PF) 4 MG/ML IJ SOLN
4 | INTRAMUSCULAR | Status: AC
Start: 2023-09-19 — End: 2023-09-19
  Administered 2023-09-19: 17:00:00 2 mg via INTRAVENOUS

## 2023-09-19 MED ORDER — LINACLOTIDE 145 MCG PO CAPS
145 | Freq: Every day | ORAL | Status: DC
Start: 2023-09-19 — End: 2023-09-22
  Administered 2023-09-22: 13:00:00 290 ug via ORAL

## 2023-09-19 MED ORDER — MAGNESIUM CITRATE PO SOLN
Freq: Once | ORAL | Status: AC
Start: 2023-09-19 — End: 2023-09-19
  Administered 2023-09-19: 06:00:00 296 mL via ORAL

## 2023-09-19 MED ORDER — OXYCODONE HCL 5 MG PO TABS
5 | ORAL | Status: DC | PRN
Start: 2023-09-19 — End: 2023-09-21
  Administered 2023-09-19 – 2023-09-20 (×2): 5 mg via ORAL

## 2023-09-19 MED FILL — PANTOPRAZOLE SODIUM 40 MG IV SOLR: 40 MG | INTRAVENOUS | Qty: 40 | Fill #0

## 2023-09-19 MED FILL — MORPHINE SULFATE 4 MG/ML IJ SOLN: 4 mg/mL | INTRAMUSCULAR | Qty: 1 | Fill #0

## 2023-09-19 MED FILL — INSULIN LISPRO 100 UNIT/ML IJ SOLN: 100 UNIT/ML | INTRAMUSCULAR | Qty: 2 | Fill #0

## 2023-09-19 MED FILL — HYDRALAZINE HCL 20 MG/ML IJ SOLN: 20 MG/ML | INTRAMUSCULAR | Qty: 1 | Fill #0

## 2023-09-19 MED FILL — AMITRIPTYLINE HCL 10 MG PO TABS: 10 MG | ORAL | Qty: 1 | Fill #0

## 2023-09-19 MED FILL — DICYCLOMINE HCL 20 MG PO TABS: 20 MG | ORAL | Qty: 1 | Fill #0

## 2023-09-19 MED FILL — OXYCODONE HCL 5 MG PO TABS: 5 MG | ORAL | Qty: 1 | Fill #0

## 2023-09-19 MED FILL — GOLYTELY 236 G PO SOLR: 236 g | ORAL | Qty: 4000 | Fill #0

## 2023-09-19 MED FILL — LISINOPRIL 20 MG PO TABS: 20 MG | ORAL | Qty: 2 | Fill #0

## 2023-09-19 MED FILL — ONDANSETRON HCL 4 MG/2ML IJ SOLN: 4 MG/2ML | INTRAMUSCULAR | Qty: 2 | Fill #0

## 2023-09-19 MED FILL — ONDANSETRON 4 MG PO TBDP: 4 MG | ORAL | Qty: 1 | Fill #0

## 2023-09-19 MED FILL — HYDROMORPHONE HCL 1 MG/ML IJ SOLN: 1 MG/ML | INTRAMUSCULAR | Qty: 1 | Fill #0

## 2023-09-19 MED FILL — CEFTRIAXONE SODIUM 1 G IJ SOLR: 1 g | INTRAMUSCULAR | Qty: 1000 | Fill #0

## 2023-09-19 MED FILL — LORAZEPAM 2 MG/ML IJ SOLN: 2 MG/ML | INTRAMUSCULAR | Qty: 1 | Fill #0

## 2023-09-19 MED FILL — METOCLOPRAMIDE HCL 5 MG/ML IJ SOLN: 5 MG/ML | INTRAMUSCULAR | Qty: 2 | Fill #0

## 2023-09-19 MED FILL — LINZESS 145 MCG PO CAPS: 145 MCG | ORAL | Qty: 2 | Fill #0

## 2023-09-19 MED FILL — METOPROLOL SUCCINATE ER 50 MG PO TB24: 50 MG | ORAL | Qty: 1 | Fill #0

## 2023-09-19 MED FILL — MAGNESIUM CITRATE 1.745 GM/30ML PO SOLN: 1.745 GM/30ML | ORAL | Qty: 296 | Fill #0

## 2023-09-19 MED FILL — ACETAMINOPHEN 325 MG PO TABS: 325 MG | ORAL | Qty: 2 | Fill #0

## 2023-09-19 MED FILL — AMLODIPINE BESYLATE 5 MG PO TABS: 5 MG | ORAL | Qty: 2 | Fill #0

## 2023-09-19 NOTE — Consults (Signed)
Session ID: [id number]  Language: Spanish  Interpreter ID: #[id number]  Interpreter Name: De Nurse

## 2023-09-19 NOTE — Plan of Care (Signed)
 Problem: Chronic Conditions and Co-morbidities  Goal: Patient's chronic conditions and co-morbidity symptoms are monitored and maintained or improved  Outcome: Progressing     Problem: Discharge Planning  Goal: Discharge to home or other facility with ap

## 2023-09-19 NOTE — Care Coordination-Inpatient (Signed)
 Care Management Initial Assessment  09/19/2023 12:34 PM  If patient is discharged prior to next notation, then this note serves as note for discharge by case management.    Reason for Admission:   Gastroparesis [K31.84]  Intractable nausea and vomiting

## 2023-09-19 NOTE — Progress Notes (Signed)
 Ultrasound IV by  :  Procedure Note    Ultrasound IV education provided to patient. Opportunities for questions given.     Ultrasound used for PIV placement:  20 gauge 8 cm PowerGlide Pro 8cm  right brachial} location.  2 X Attempt(s).    Vigorous blood re

## 2023-09-19 NOTE — Progress Notes (Signed)
 Melinda Farro, PA-C                       [phone] office             Monday-Friday 8:00 am-4:30 pm  I am not permitted to use "perfect serve" use above

## 2023-09-19 NOTE — Plan of Care (Signed)
 Problem: Chronic Conditions and Co-morbidities  Goal: Patient's chronic conditions and co-morbidity symptoms are monitored and maintained or improved  Outcome: Progressing  Flowsheets (Taken 09/18/2023 2030 by Marquette Old, RN)  Care Plan - Patient's C

## 2023-09-19 NOTE — Progress Notes (Signed)
 [facility]  [address] [place], [place] [address]  [phone]      Hospitalist Progress Note      NAME: Melinda [name]   DOB:  [birth year]  MRM:  [id number]    Date of service: 09/19/2023  10:55 AM       Assessmen

## 2023-09-19 NOTE — Progress Notes (Signed)
 Patient/caregivers speak Spanish  as their preferred language for their healthcare communication. For safe communication, use the AMN interpreter carts or call:    Senior Interpreter/Navigator Jason Fila at [phone] or   AMN phone services for [place]. Fra

## 2023-09-19 NOTE — Progress Notes (Signed)
Pt has had pain all day not relieved with any PRN pain med's. I notified MD, ordered a stat CT waiting for results.   Pt has also been emesis off and on all day (yellow) PRN nausea med's did not help.  Pt has not had a BM today.

## 2023-09-19 NOTE — Progress Notes (Signed)
New patient PCP appointment set for Monday January 6th 2025 at 3:30 PM with Doctor [name] at their [place] office. Office asks for patient to arrive 15 mins early, bring picture ID/Insurance cards and list of medications they are currently taking.

## 2023-09-20 LAB — BASIC METABOLIC PANEL
Anion Gap: 5 mmol/L (ref 2–12)
BUN/Creatinine Ratio: 6 — ABNORMAL LOW (ref 12–20)
BUN: 9 mg/dL (ref 6–20)
CO2: 22 mmol/L (ref 21–32)
Calcium: 7.7 mg/dL — ABNORMAL LOW (ref 8.5–10.1)
Chloride: 111 mmol/L — ABNORMAL HIGH (ref 97–108)
Creatinine: 1.45 mg/dL — ABNORMAL HIGH (ref 0.55–1.02)
Est, Glom Filt Rate: 46 mL/min/{1.73_m2} — ABNORMAL LOW (ref >60)
Glucose: 154 mg/dL — ABNORMAL HIGH (ref 65–100)
Potassium: 3.8 mmol/L (ref 3.5–5.1)
Sodium: 138 mmol/L (ref 136–145)

## 2023-09-20 LAB — CBC WITH AUTO DIFFERENTIAL
Basophils %: 1 % (ref 0–1)
Basophils Absolute: 0.1 10*3/uL (ref 0.0–0.1)
Eosinophils %: 7 % (ref 0–7)
Eosinophils Absolute: 0.4 10*3/uL (ref 0.0–0.4)
Hematocrit: 26.8 % — ABNORMAL LOW (ref 35.0–47.0)
Hemoglobin: 8.8 g/dL — ABNORMAL LOW (ref 11.5–16.0)
Immature Granulocytes %: 0 % (ref 0.0–0.5)
Immature Granulocytes Absolute: 0 10*3/uL (ref 0.00–0.04)
Lymphocytes %: 23 % (ref 12–49)
Lymphocytes Absolute: 1.3 10*3/uL (ref 0.8–3.5)
MCH: 28.5 pg (ref 26.0–34.0)
MCHC: 32.8 g/dL (ref 30.0–36.5)
MCV: 86.7 fL (ref 80.0–99.0)
MPV: 8.5 fL — ABNORMAL LOW (ref 8.9–12.9)
Monocytes %: 7 % (ref 5–13)
Monocytes Absolute: 0.4 10*3/uL (ref 0.0–1.0)
Neutrophils %: 62 % (ref 32–75)
Neutrophils Absolute: 3.5 10*3/uL (ref 1.8–8.0)
Nucleated RBCs: 0 /100{WBCs}
Platelets: 354 10*3/uL (ref 150–400)
RBC: 3.09 M/uL — ABNORMAL LOW (ref 3.80–5.20)
RDW: 13.6 % (ref 11.5–14.5)
WBC: 5.6 10*3/uL (ref 3.6–11.0)
nRBC: 0 10*3/uL (ref 0.00–0.01)

## 2023-09-20 LAB — POCT GLUCOSE
POC Glucose: 46 mg/dL — CL (ref 65–117)
POC Glucose: 82 mg/dL (ref 65–117)
POC Glucose: 163 mg/dL — ABNORMAL HIGH (ref 65–117)
POC Glucose: 154 mg/dL — ABNORMAL HIGH (ref 65–117)
POC Glucose: 138 mg/dL — ABNORMAL HIGH (ref 65–117)

## 2023-09-20 LAB — HEPATIC FUNCTION PANEL
ALT: 27 U/L (ref 12–78)
AST: 26 U/L (ref 15–37)
Albumin/Globulin Ratio: 0.5 — ABNORMAL LOW (ref 1.1–2.2)
Albumin: 1.8 g/dL — ABNORMAL LOW (ref 3.5–5.0)
Alk Phosphatase: 97 U/L (ref 45–117)
Bilirubin, Direct: <0.1 mg/dL (ref 0.0–0.2)
Globulin: 3.4 g/dL (ref 2.0–4.0)
Total Bilirubin: 0.2 mg/dL (ref 0.2–1.0)
Total Protein: 5.2 g/dL — ABNORMAL LOW (ref 6.4–8.2)

## 2023-09-20 LAB — CULTURE, URINE: Culture: NO GROWTH

## 2023-09-20 LAB — CELIAC DISEASE PANEL
IgA: 301 mg/dL (ref 87–352)
Tissue Transglutaminase IgA: <2 U/mL (ref 0–3)

## 2023-09-20 LAB — LIPASE: Lipase: 13 U/L (ref 13–75)

## 2023-09-20 MED ORDER — SODIUM CHLORIDE 0.9 % IV SOLN
0.9 | INTRAVENOUS | Status: DC
Start: 2023-09-20 — End: 2023-09-22
  Administered 2023-09-20 – 2023-09-22 (×6): via INTRAVENOUS

## 2023-09-20 MED FILL — METOCLOPRAMIDE HCL 5 MG/ML IJ SOLN: 5 MG/ML | INTRAMUSCULAR | Qty: 2 | Fill #0

## 2023-09-20 MED FILL — CEFTRIAXONE SODIUM 1 G IJ SOLR: 1 g | INTRAMUSCULAR | Qty: 1000 | Fill #0

## 2023-09-20 MED FILL — SODIUM CHLORIDE 0.9 % IV SOLN: 0.9 % | INTRAVENOUS | Qty: 1000 | Fill #0

## 2023-09-20 MED FILL — LORAZEPAM 2 MG/ML IJ SOLN: 2 MG/ML | INTRAMUSCULAR | Qty: 1 | Fill #0

## 2023-09-20 MED FILL — AMITRIPTYLINE HCL 10 MG PO TABS: 10 MG | ORAL | Qty: 1 | Fill #0

## 2023-09-20 MED FILL — HYDROMORPHONE HCL 1 MG/ML IJ SOLN: 1 MG/ML | INTRAMUSCULAR | Qty: 1 | Fill #0

## 2023-09-20 NOTE — Progress Notes (Signed)
 Patient/caregivers speak Spanish  as their preferred language for their healthcare communication. For safe communication, use the AMN interpreter carts or call:    Senior Interpreter/Navigator Jason Fila at [phone] or   AMN phone services for [place]. Fra

## 2023-09-20 NOTE — Progress Notes (Signed)
 Melinda Farro, PA-C                       [phone] office             Monday-Friday 8:00 am-4:30 pm  I am not permitted to use "perfect serve" use above

## 2023-09-20 NOTE — Progress Notes (Signed)
 [facility]  [address] [place], [place] [address]  [phone]      Hospitalist Progress Note      NAME: Melinda [name]   DOB:  [birth year]  MRM:  [id number]    Date of service: 09/20/2023  10:49 AM       Assessme

## 2023-09-20 NOTE — Care Coordination-Inpatient (Signed)
 Care Management Progress Note    Reason for Admission:   Gastroparesis [K31.84]  Intractable nausea and vomiting [R11.2]  Hematemesis with nausea [K92.0]  Procedure(s) (LRB):  ESOPHAGOGASTRODUODENOSCOPY (N/A)       Patient Admission Status: Inpatient  RUR:

## 2023-09-20 NOTE — Consults (Signed)
Session ID: [id number]  Language: Spanish  Interpreter ID: [id number]  Interpreter Name: Harriett Sine

## 2023-09-20 NOTE — Plan of Care (Signed)
 Problem: Chronic Conditions and Co-morbidities  Goal: Patient's chronic conditions and co-morbidity symptoms are monitored and maintained or improved  09/20/2023 0153 by John Giovanni, RN  Outcome: Progressing  Flowsheets (Taken 09/19/2023 2014)  Care Plan

## 2023-09-21 LAB — EXTRA TUBES HOLD

## 2023-09-21 LAB — POCT GLUCOSE
POC Glucose: 130 mg/dL — ABNORMAL HIGH (ref 65–117)
POC Glucose: 143 mg/dL — ABNORMAL HIGH (ref 65–117)
POC Glucose: 146 mg/dL — ABNORMAL HIGH (ref 65–117)
POC Glucose: 225 mg/dL — ABNORMAL HIGH (ref 65–117)

## 2023-09-21 LAB — TRIGLYCERIDES: Triglycerides: 453 mg/dL — ABNORMAL HIGH (ref <150)

## 2023-09-21 MED ORDER — NORMAL SALINE FLUSH 0.9 % IV SOLN
0.9 | Freq: Two times a day (BID) | INTRAVENOUS | Status: DC
Start: 2023-09-21 — End: 2023-09-21

## 2023-09-21 MED ORDER — SODIUM CHLORIDE 0.9 % IV SOLN
0.9 | INTRAVENOUS | Status: DC | PRN
Start: 2023-09-21 — End: 2023-09-21

## 2023-09-21 MED ORDER — NORMAL SALINE FLUSH 0.9 % IV SOLN
0.9 | INTRAVENOUS | Status: DC | PRN
Start: 2023-09-21 — End: 2023-09-21

## 2023-09-21 MED ORDER — LIDOCAINE HCL 2 % IJ SOLN (MIXTURES ONLY)
2 | Freq: Once | INTRAMUSCULAR | Status: DC | PRN
Start: 2023-09-21 — End: 2023-09-21
  Administered 2023-09-21: 19:00:00 80 via INTRAVENOUS

## 2023-09-21 MED ORDER — PROPOFOL 200 MG/20ML IV EMUL
200 | Freq: Once | INTRAVENOUS | Status: DC | PRN
Start: 2023-09-21 — End: 2023-09-21
  Administered 2023-09-21: 19:00:00 50 via INTRAVENOUS
  Administered 2023-09-21: 19:00:00 180 via INTRAVENOUS

## 2023-09-21 MED FILL — BD POSIFLUSH 0.9 % IV SOLN: 0.9 % | INTRAVENOUS | Qty: 40 | Fill #0

## 2023-09-21 NOTE — Progress Notes (Signed)
 TRANSFER - OUT REPORT:    Verbal report given LeighAnn,RN to  on Melinda [name]-Ambros  being transferred to 303 for routine post-op       Report consisted of patient's Situation, Background, Assessment and   Recommendations(SBAR).     Information from th

## 2023-09-21 NOTE — H&P (Signed)
.  Pre-Endoscopy H&P Update  Chief complaint/HPI/ROS:  The indication for the procedure, the patient's history and the patient's current medications are reviewed prior to the procedure and that data is reported on the H&P to which this document is attached.

## 2023-09-21 NOTE — Op Note (Signed)
.                           [facility]  [place] [facility] [place] [facility], [place]  [phone]      September 21, 2023    Esophagogastroduodenoscopy (EGD) Procedure Note  Sherissa Tenenbaum Toto-Ambros  DOB: [birth year]  Carmela Hurt

## 2023-09-21 NOTE — Anesthesia Pre-Procedure Evaluation (Signed)
 Department of Anesthesiology  Preprocedure Note       Name:  Melinda [name]   Age:  42 y.o.  DOB:  01/07/1981                                          MRN:  [id number]         Date:  09/21/2023      Surgeon: Moishe Spice):  Glyn Ade, MD    Procedure

## 2023-09-21 NOTE — Progress Notes (Signed)
 [facility]  [address] [place], [place] [address]  [phone]      Hospitalist Progress Note      NAME: Sharlett Lienemann   DOB:  22-Aug-1981  MRM:  [id number]    Date of service: 09/21/2023  10:44 AM       Assessme

## 2023-09-21 NOTE — Progress Notes (Signed)
 Initial RN admission and assessment performed and documented in Endoscopy navigator.     Patient evaluated by anesthesia in pre-procedure holding.     All procedural vital signs, airway assessment, and level of consciousness information monitored and recor

## 2023-09-21 NOTE — Anesthesia Post-Procedure Evaluation (Signed)
 Department of Anesthesiology  Postprocedure Note    Patient: Melinda [name]  MRN: [id number]  Birthdate: 05/12/1981  Date of evaluation: 09/21/2023    Procedure Summary       Date: 09/21/23 Room / Location: [facility] 02 / [facility]    Anesthesia S

## 2023-09-21 NOTE — Plan of Care (Signed)
 Problem: Chronic Conditions and Co-morbidities  Goal: Patient's chronic conditions and co-morbidity symptoms are monitored and maintained or improved  09/21/2023 2243 by Catheryn Bacon, RN  Outcome: Progressing  09/21/2023 1855 by Sandi Carne, RN  Melinda [name]

## 2023-09-21 NOTE — Plan of Care (Signed)
 Problem: Chronic Conditions and Co-morbidities  Goal: Patient's chronic conditions and co-morbidity symptoms are monitored and maintained or improved  Outcome: Progressing  Flowsheets (Taken 09/20/2023 1950)  Care Plan - Patient's Chronic Conditions and

## 2023-09-21 NOTE — Consults (Signed)
Session ID: [id number]  Language: Spanish  Interpreter ID: [id number]  Interpreter Name: Larkin Ina

## 2023-09-21 NOTE — Plan of Care (Signed)
 Problem: Chronic Conditions and Co-morbidities  Goal: Patient's chronic conditions and co-morbidity symptoms are monitored and maintained or improved  Outcome: Progressing     Problem: Discharge Planning  Goal: Discharge to home or other facility with ap

## 2023-09-21 NOTE — Progress Notes (Addendum)
1150 Pt off the floor, heading to endo    Pt returned to room    Vitals:    09/21/23 1538   BP: (!) 152/89   Pulse: 97   Resp: 15   Temp: 98 F (36.7 C)   SpO2: 97%

## 2023-09-21 NOTE — Care Coordination-Inpatient (Signed)
 Care Management Progress Note    Reason for Admission:   Gastroparesis [K31.84]  Intractable nausea and vomiting [R11.2]  Hematemesis with nausea [K92.0]  Procedure(s) (LRB):  ESOPHAGOGASTRODUODENOSCOPY (N/A)  Day of Surgery    Patient Admission Status: In

## 2023-09-22 ENCOUNTER — Inpatient Hospital Stay: Admit: 2023-09-22 | Discharge: 2023-09-27 | Payer: MEDICAID | Primary: Adult Health

## 2023-09-22 ENCOUNTER — Inpatient Hospital Stay: Payer: MEDICAID | Primary: Adult Health

## 2023-09-22 ENCOUNTER — Inpatient Hospital Stay: Admit: 2023-09-22 | Payer: MEDICAID | Primary: Adult Health

## 2023-09-22 LAB — CBC WITH AUTO DIFFERENTIAL
Basophils %: 1 % (ref 0–1)
Basophils Absolute: 0.1 10*3/uL (ref 0.0–0.1)
Eosinophils %: 6 % (ref 0–7)
Eosinophils Absolute: 0.5 10*3/uL — ABNORMAL HIGH (ref 0.0–0.4)
Hematocrit: 27.5 % — ABNORMAL LOW (ref 35.0–47.0)
Hemoglobin: 9.1 g/dL — ABNORMAL LOW (ref 11.5–16.0)
Immature Granulocytes %: 0 % (ref 0.0–0.5)
Immature Granulocytes Absolute: 0 10*3/uL (ref 0.00–0.04)
Lymphocytes %: 25 % (ref 12–49)
Lymphocytes Absolute: 2.1 10*3/uL (ref 0.8–3.5)
MCH: 28.9 pg (ref 26.0–34.0)
MCHC: 33.1 g/dL (ref 30.0–36.5)
MCV: 87.3 fL (ref 80.0–99.0)
MPV: 8.5 fL — ABNORMAL LOW (ref 8.9–12.9)
Monocytes %: 7 % (ref 5–13)
Monocytes Absolute: 0.6 10*3/uL (ref 0.0–1.0)
Neutrophils %: 61 % (ref 32–75)
Neutrophils Absolute: 5.3 10*3/uL (ref 1.8–8.0)
Nucleated RBCs: 0 /100{WBCs}
Platelets: 365 10*3/uL (ref 150–400)
RBC: 3.15 M/uL — ABNORMAL LOW (ref 3.80–5.20)
RDW: 13.6 % (ref 11.5–14.5)
WBC: 8.5 10*3/uL (ref 3.6–11.0)
nRBC: 0 10*3/uL (ref 0.00–0.01)

## 2023-09-22 LAB — BASIC METABOLIC PANEL
Anion Gap: 3 mmol/L (ref 2–12)
BUN/Creatinine Ratio: 7 — ABNORMAL LOW (ref 12–20)
BUN: 11 mg/dL (ref 6–20)
CO2: 23 mmol/L (ref 21–32)
Calcium: 7.9 mg/dL — ABNORMAL LOW (ref 8.5–10.1)
Chloride: 112 mmol/L — ABNORMAL HIGH (ref 97–108)
Creatinine: 1.66 mg/dL — ABNORMAL HIGH (ref 0.55–1.02)
Est, Glom Filt Rate: 40 mL/min/{1.73_m2} — ABNORMAL LOW (ref >60)
Glucose: 159 mg/dL — ABNORMAL HIGH (ref 65–100)
Potassium: 3.9 mmol/L (ref 3.5–5.1)
Sodium: 138 mmol/L (ref 136–145)

## 2023-09-22 LAB — EXTRA TUBES HOLD

## 2023-09-22 LAB — POCT GLUCOSE
POC Glucose: 110 mg/dL (ref 65–117)
POC Glucose: 185 mg/dL — ABNORMAL HIGH (ref 65–117)
POC Glucose: 156 mg/dL — ABNORMAL HIGH (ref 65–117)
POC Glucose: 174 mg/dL — ABNORMAL HIGH (ref 65–117)

## 2023-09-22 LAB — LIPASE: Lipase: 12 U/L — ABNORMAL LOW (ref 13–75)

## 2023-09-22 LAB — BRAIN NATRIURETIC PEPTIDE: NT Pro-BNP: 1197 pg/mL — ABNORMAL HIGH (ref <125)

## 2023-09-22 MED ORDER — OXYCODONE HCL 5 MG PO TABS
5 | ORAL | Status: DC | PRN
Start: 2023-09-22 — End: 2023-09-22

## 2023-09-22 MED ORDER — LINACLOTIDE 290 MCG PO CAPS
290 | ORAL_CAPSULE | Freq: Every day | ORAL | 0 refills | 30.00000 days | Status: DC
Start: 2023-09-22 — End: 2024-07-02

## 2023-09-22 MED FILL — LINZESS 145 MCG PO CAPS: 145 MCG | ORAL | Qty: 2

## 2023-09-22 MED FILL — METOCLOPRAMIDE HCL 5 MG/ML IJ SOLN: 5 MG/ML | INTRAMUSCULAR | Qty: 2

## 2023-09-22 MED FILL — PANTOPRAZOLE SODIUM 40 MG IV SOLR: 40 MG | INTRAVENOUS | Qty: 40

## 2023-09-22 MED FILL — AMLODIPINE BESYLATE 5 MG PO TABS: 5 MG | ORAL | Qty: 2

## 2023-09-22 MED FILL — LISINOPRIL 20 MG PO TABS: 20 MG | ORAL | Qty: 2

## 2023-09-22 MED FILL — METOPROLOL SUCCINATE ER 50 MG PO TB24: 50 MG | ORAL | Qty: 1

## 2023-09-22 MED FILL — INSULIN LISPRO 100 UNIT/ML IJ SOLN: 100 UNIT/ML | INTRAMUSCULAR | Qty: 2

## 2023-09-22 NOTE — Other (Signed)
Patient w/ generalized edema, notably to upper extremities. Fluid weeping from venipuncture sites. Patient offers intermittent episodes of nonexertional SOB. Not hypoxic. Adequate UOP. In 2022, evaluated by cards for SOB and tachycardia. ECHO and stress ECHO unremarkable. Concern for volume overload. Hold fluids. CXR, CBC, CMP. BNP added to AM labs. Will diureses as indicated. Consider repeating ECHO. Continue to monitor.

## 2023-09-22 NOTE — Discharge Summary (Signed)
Discharge Summary       PATIENT ID: Melinda [name]  MRN: [id number]   DATE OF BIRTH: 02-07-1981    DATE OF ADMISSION: 09/17/2023  2:48 PM    DATE OF DISCHARGE: 09/22/2023 3:05 PM  PRIMARY CARE PROVIDER: Orlean Bradford, APRN - NP     ATTENDING PHYSICIAN: Reggy Eye, MD  DISCHARGING PROVIDER: Reggy Eye, MD    To contact this individual call [phone] and ask the operator to page.  If unavailable ask to be transferred the Adult Hospitalist Department.    CONSULTATIONS: IP CONSULT TO GI    PROCEDURES/SURGERIES: Procedure(s):  ESOPHAGOGASTRODUODENOSCOPY  ESOPHAGOGASTRODUODENOSCOPY BIOPSY    ADMITTING DIAGNOSES & HOSPITAL COURSE:   Patient is a 42 year old woman with a history of IBS, followed for GI for chronic nausea and vomiting, admitted for acute pancreatitis.    DISCHARGE DIAGNOSES / PLAN:      Acute pancreatitis  - With nausea, vomiting, abdominal pain  - IV fluids, pain meds  - Diet advanced  - Symptoms now resolved    Gastroparesis  - Continue Reglan and IV fluid  - GI evaluated-patient had an EGD on 12/11  - Started on Linzess  - She should follow-up with GI next week  - Continue Reglan, Linzess, amitriptyline, PPI on discharge    History of CKD  - At baseline  - Avoid nephrotoxic drugs    Hypertension  - Continue amlodipine, lisinopril, metoprolol    Diabetes  - Diabetic diet, resume home meds    Mood disorder  - On Elavil      VA POLST COMPLETED: No       PENDING TEST RESULTS:   At the time of discharge the following test results are still pending: Biopsies from EGD - Follow up with GI    FOLLOW UP APPOINTMENTS:    Aida Raider, MD  [address]  [place] [address]  [phone]    Go on 10/17/2023  New patient PCP appointment set for Monday January 6th 2025 at 3:30 PM with Doctor [name] at their [place] office. Office asks for patient to arrive 15 mins early, bring picture ID/Insurance cards and list of medications they are currently taking.    Glyn Ade, MD  [address]  [place] [address]  [phone]    Follow up in 5 day(s)         ADDITIONAL CARE RECOMMENDATIONS: Return to the ER for recurrence of symptoms or worsening    DIET:  ADULT DIET; Regular; 4 carb choices (60 gm/meal)    ACTIVITY: activity as tolerated    WOUND CARE: N/A      DISCHARGE MEDICATIONS:     Medication List        START taking these medications      linaclotide 290 MCG Caps capsule  Commonly known as: LINZESS  Take 1 capsule by mouth every morning (before breakfast)  Start taking on: September 23, 2023            CONTINUE taking these medications      amitriptyline 10 MG tablet  Commonly known as: ELAVIL  Take 1 tablet by mouth nightly     amLODIPine 10 MG tablet  Commonly known as: NORVASC  Take 1 tablet by mouth daily     dicyclomine 20 MG tablet  Commonly known as: BENTYL  Take 1 tablet by mouth every 6 hours as needed (abdominal cramping)     lisinopril 40 MG tablet  Commonly known as: PRINIVIL;ZESTRIL  Take 1 tablet by mouth daily     metoclopramide 10 MG tablet  Commonly known as: Reglan  Take 1 tablet by mouth 4 times daily as needed (nausea, vomiting)     metoprolol succinate 50 MG extended release tablet  Commonly known as: TOPROL XL  Take 1 tablet by mouth daily     ondansetron 4 MG disintegrating tablet  Commonly known as: ZOFRAN-ODT  Take 1 tablet by mouth every 8 hours as needed for Nausea or Vomiting     pantoprazole 40 MG tablet  Commonly known as: PROTONIX  Take 1 tablet by mouth 2 times daily (before meals)     polyethylene glycol 17 GM/SCOOP powder  Commonly known as: GLYCOLAX  Take 17 g by mouth 2 times daily            STOP taking these medications      Prucalopride Succinate 2 MG tablet  Commonly known as: Motegrity     sennosides-docusate sodium 8.6-50 MG tablet  Commonly known as: SENOKOT-S               Where to Get Your Medications        These medications were sent to [facility] [address] [place] [address] [place] [address] [place] [phone] Carmon Ginsberg [phone]   [address] [place] [address]      Phone: [phone]   linaclotide 290 MCG Caps capsule           NOTIFY YOUR PHYSICIAN FOR ANY OF THE FOLLOWING:   Fever over 101 degrees for 24 hours.   Chest pain, shortness of breath, fever, chills, nausea, vomiting, diarrhea, change in mentation, falling, weakness, bleeding. Severe pain or pain not relieved by medications.  Or, any other signs or symptoms that you may have questions about.    DISPOSITION:       x Home With:   OT  PT  HH  RN       Long term SNF/Inpatient Rehab    Independent/assisted living    Hospice    Other:       PHYSICAL EXAMINATION AT DISCHARGE:    General : alert x 3, awake, no acute distress,   HEENT: PEERL, EOMI, moist mucus membrane  Neck: supple, no JVD, no meningeal signs  Chest: Clear to auscultation bilaterally   CVS: S1 S2 heard, Capillary refill less than 2 seconds  Abd: soft/ Non tender, non distended, BS physiological,   Ext: no clubbing, no cyanosis, Mild RUE edema, no periphera edema, brisk 2+ DP pulses  Neuro/Psych: pleasant mood and affect, CN 2-12 grossly intact  Skin: warm     CHRONIC MEDICAL DIAGNOSES:      Greater than 31 minutes were spent with the patient on counseling and coordination of care    Signed:   Reggy Eye, MD  09/22/2023  3:05 PM

## 2023-09-22 NOTE — Consults (Signed)
Session ID: [id number]  Language: Spanish  Interpreter ID: [id number]  Interpreter Name: Norva Pavlov

## 2023-09-22 NOTE — Progress Notes (Signed)
Bilateral arms swollen and weeping. Provider aware and at bedside order to hold fluids right now new order in place.

## 2023-09-22 NOTE — Care Coordination-Inpatient (Addendum)
Care Management Progress Note    Reason for Admission:   Gastroparesis [K31.84]  Intractable nausea and vomiting [R11.2]  Hematemesis with nausea [K92.0]  Procedure(s) (LRB):  ESOPHAGOGASTRODUODENOSCOPY (N/A)  ESOPHAGOGASTRODUODENOSCOPY BIOPSY (N/A)  1 Day Post-Op    Patient Admission Status: Inpatient  RUR:   Hospitalization in the last 30 days (Readmission):  Yes        Transition of care plan:  Per IDR, patient with ECHO pending. EDD is 12/13 pending improvement in clinical status. Anticipate home with family when stable. CM following.  ______________________  Lucilla Lame BSN, RN  Care Management  09/22/2023    Care Management Discharge Note:      09/22/23 1545   Discharge Planning   Patient expects to be discharged to: [facility] At/After Discharge   Transition of Care Consult (CM Consult) N/A   Services At/After Discharge None   Mode of Transport at Discharge Other (see comment)  (family)   Confirm Follow Up Transport Self     Patient with dc orders. No identified CM needs. Patient will dc home with family to transport.   ______________________  Lucilla Lame BSN, RN  Care Management  09/22/2023

## 2023-09-22 NOTE — Consults (Signed)
Session ID: [id number]  Language: Spanish  Interpreter ID: [id number]  Interpreter Name: Champ Mungo

## 2023-09-22 NOTE — Consults (Signed)
Session ID: [id number]  Language: Spanish  Interpreter ID: [id number]  Interpreter Name: Paula Compton

## 2023-09-22 NOTE — Progress Notes (Signed)
Provider states to get labs when can when pt bilateral arm swollen goes down. Unable to obtain labs this am. Supervisor tried, Clinical research associate tried x2.

## 2023-09-22 NOTE — Progress Notes (Addendum)
2440 Night RN reported to this oncoming RN that they were unsuccessful with drawing labs overnight. Multiple nurses attempted. This RN contacted PICC team to place a line and draw labs off it. Dr. Genice Rouge notified.     1032 Pro BNP 1,197 Dr. Genice Rouge notified

## 2023-09-22 NOTE — Consults (Signed)
Session ID: [id number]  Language: Spanish  Interpreter ID: #[id number]  Interpreter Name: Johnna Acosta

## 2023-09-22 NOTE — Progress Notes (Signed)
Rucker Pridgeon, PA-C                       [phone] office             Monday-Friday 8:00 am-4:30 pm  I am not permitted to use "perfect serve" use above for contact, thanks.        Gastroenterology Progress Note    September 22, 2023  Admit Date: 09/17/2023         Narrative Assessment and Plan   42 y.o. female being followed by GI for nausea and vomiting, constipation.  CT imaging obtained 09/19/2023 showed possible fat stranding around pancreas, compatible with acute pancreatitis.  Wall thickening and fat stranding involving duodenum identified as well.  Lipase within normal limits at 13, not consistent with pancreatitis.  EGD performed yesterday to assess for duodenitis or duodenal ulcer with healthy appearing duodenum.  Symptoms have improved today.  Continues on PPI.  On Motegrity in the past but unable to take at home due to cost.    Impression:  Abdominal pain-resolved  Nausea and vomiting-resolved  IBS-C    Plan:  Linzess to 90 mcg/day started today, reviewed with patient.  Outpatient follow-up with [facility] in the coming week for monitoring of Linzess treatment and symptoms.  Continue pantoprazole 40 mg once daily at discharge.  Continue Reglan and amitriptyline.  Inpatient GI to sign off, please reconsult if needed further.    [name], PA-C    Subjective:   Chief Complaint: " I feel much better today"    HPI: Patient sitting up at the edge of bed today, tells me she feels much better.  She does visibly appear much more comfortable.  Abdominal pain, nausea, and vomiting have resolved and she moved her bowels this morning.  EGD yesterday by Dr. Brent Bulla largely unremarkable with minimal gastric emptying erythema as previously noted.  Mucosal biopsies obtained from stomach with Sydney biopsy protocol.    EGD by Dr. Brent Bulla 09/21/2023:  "Findings:   Esophagus-normal-appearing esophagus and normal-appearing  gastroesophageal junction  Stomach-mild diffuse erythema throughout the stomach with sitting biopsy protocols mucosal biopsies obtained from the gastric body, angularis and gastric antrum in separate containers  Duodenum-healthy-appearing to the D3 segment with cold force mucosal biopsies obtained     Specimens:   1.  Duodenum cold forcep mucosal biopsies  2.  Gastric antrum cold forcep mucosal biopsies  3.  Gastric angularis cold forcep mucosal biopsies  4.  Gastric body cold forcep mucosal biopsies     Impression:   Unremarkable EGD/upper endoscopy with minimal gastric erythema as previously noted  Mucosal biopsies obtained from the stomach, Sydney biopsy protocol for further assessment of previously noted intestinal metaplasia  Mucosal biopsies obtained from the duodenum"    ROS:  The previous review of systems on initial consultation / H&P is noted and reviewed.  Specific changes noted above in HPI.    Current Medications:     Current Facility-Administered Medications   Medication Dose Route Frequency    oxyCODONE (ROXICODONE) immediate release tablet 5 mg  5 mg Oral Q4H PRN    hydrALAZINE (APRESOLINE) injection 10 mg  10 mg IntraVENous Q6H PRN    linaclotide (LINZESS) capsule 290 mcg  290 mcg Oral QAM AC    HYDROmorphone HCl PF (DILAUDID) injection 1 mg  1 mg IntraVENous Q4H PRN    LORazepam (ATIVAN) injection 1 mg  1 mg IntraVENous Q6H PRN    amitriptyline (ELAVIL) tablet  10 mg  10 mg Oral Nightly    amLODIPine (NORVASC) tablet 10 mg  10 mg Oral Daily    dicyclomine (BENTYL) tablet 20 mg  20 mg Oral Q6H PRN    lisinopril (PRINIVIL;ZESTRIL) tablet 40 mg  40 mg Oral Daily    metoprolol succinate (TOPROL XL) extended release tablet 50 mg  50 mg Oral Daily    metoclopramide (REGLAN) injection 10 mg  10 mg IntraVENous Q6H    pantoprazole (PROTONIX) 40 mg in sodium chloride (PF) 0.9 % 10 mL injection  40 mg IntraVENous Q12H    sodium chloride flush 0.9 % injection 5-40 mL  5-40 mL IntraVENous 2 times per day     sodium chloride flush 0.9 % injection 5-40 mL  5-40 mL IntraVENous PRN    0.9 % sodium chloride infusion   IntraVENous PRN    potassium chloride (KLOR-CON) extended release tablet 40 mEq  40 mEq Oral PRN    Or    potassium bicarb-citric acid (EFFER-K) effervescent tablet 40 mEq  40 mEq Oral PRN    Or    potassium chloride 10 mEq/100 mL IVPB (Peripheral Line)  10 mEq IntraVENous PRN    magnesium sulfate 2000 mg in 50 mL IVPB premix  2,000 mg IntraVENous PRN    ondansetron (ZOFRAN-ODT) disintegrating tablet 4 mg  4 mg Oral Q8H PRN    Or    ondansetron (ZOFRAN) injection 4 mg  4 mg IntraVENous Q6H PRN    polyethylene glycol (GLYCOLAX) packet 17 g  17 g Oral Daily PRN    acetaminophen (TYLENOL) tablet 650 mg  650 mg Oral Q6H PRN    Or    acetaminophen (TYLENOL) suppository 650 mg  650 mg Rectal Q6H PRN    insulin lispro (HUMALOG,ADMELOG) injection vial 0-8 Units  0-8 Units SubCUTAneous 4x Daily AC & HS       Objective:     VITALS:   Last 24hrs VS reviewed since prior progress note. Most recent are:  Vitals:    09/22/23 0814   BP:    Pulse: 91   Resp:    Temp:    SpO2:      Temp (24hrs), Avg:98.3 F (36.8 C), Min:98 F (36.7 C), Max:99 F (37.2 C)      Intake/Output Summary (Last 24 hours) at 09/22/2023 5188  Last data filed at 09/22/2023 0820  Gross per 24 hour   Intake 580 ml   Output 1000 ml   Net -420 ml       General: alert and no distress  Head: Normocephalic, without obvious abnormality, atraumatic  Eyes: anicteric sclerae and conjuntiva clear  Lungs: normal respiratory effort  Abd: not distended, soft, nontender  Ext: no cyanosis and no edema  Skin: normal skin color, no rashes, and texture normal  Neuro:  Alert and oriented  Psych: not anxious, cooperative, appropriate affect     Lab Data Reviewed:   Recent Labs     09/20/23  0906 09/21/23  0216 09/22/23  0828   WBC 5.6 8.0 8.5   HGB 8.8* 9.5* 9.1*   HCT 26.8* 29.3* 27.5*   PLT 354 411* 365     Recent Labs     09/20/23  0906 09/21/23  0216   NA 138 138   K  3.8 3.9   CL 111* 112*   CO2 22 20*   BUN 9 8   MG  --  1.8   PHOS  --  3.4  ALT 27  --      No results found for: "GLUCPOC"  No results for input(s): "PH", "PCO2", "PO2", "HCO3", "FIO2" in the last 72 hours.  No results for input(s): "INR" in the last 72 hours.        Assessment:   (See above)  Principal Problem:    Hematemesis with nausea  Resolved Problems:    * No resolved hospital problems. *      Plan:   (See above)      Signed By: [name], PA-C     09/22/2023  9:09 AM

## 2023-09-23 LAB — ECHO (TTE) COMPLETE (PRN CONTRAST/BUBBLE/STRAIN/3D)
AV Area by Peak Velocity: 2 cm2
AV Area by VTI: 2.3 cm2
AV Mean Gradient: 7 mm[Hg]
AV Mean Velocity: 1.3 m/s
AV Peak Gradient: 10 mm[Hg]
AV Peak Velocity: 1.6 m/s
AV VTI: 31.2 cm
AV Velocity Ratio: 0.69
AVA/BSA Peak Velocity: 1.3 cm2/m2
AVA/BSA VTI: 1.5 cm2/m2
Aortic Arch: 2.7 cm
Ascending Aorta Index: 1.85 cm/m2
Ascending Aorta: 2.8 cm
Body Surface Area: 1.54 m2
E/E' Lateral: 12.26
E/E' Ratio (Averaged): 21.91
E/E' Septal: 31.56
EF BP: 59 % (ref 55–100)
Fractional Shortening 2D: 43 % (ref 28–44)
IVSd: 1.3 cm — AB (ref 0.6–0.9)
LA Diameter: 4.2 cm
LA Size Index: 2.78 cm/m2
LA Volume A-L A4C: 39 mL (ref 22–52)
LA Volume A-L A4C: 45 mL (ref 22–52)
LA Volume A/L: 42 mL
LA Volume BP: 38 mL (ref 22–52)
LA Volume Index A-L A2C: 26 mL/m2 (ref 16–34)
LA Volume Index A-L A4C: 30 mL/m2 (ref 16–34)
LA Volume Index A/L: 28 mL/m2 (ref 16–34)
LA Volume Index BP: 25 mL/m2 (ref 16–34)
LA Volume Index MOD A2C: 24 mL/m2 (ref 16–34)
LA Volume Index MOD A4C: 26 mL/m2 (ref 16–34)
LA Volume MOD A2C: 36 mL (ref 22–52)
LA Volume MOD A4C: 40 mL (ref 22–52)
LV E' Lateral Velocity: 8.73 cm/s
LV E' Septal Velocity: 3.39 cm/s
LV EDV A2C: 67 mL
LV EDV A4C: 60 mL
LV EDV BP: 65 mL (ref 56–104)
LV EDV Index A2C: 44 mL/m2
LV EDV Index A4C: 40 mL/m2
LV EDV Index BP: 43 mL/m2
LV ESV A2C: 29 mL
LV ESV A4C: 24 mL
LV ESV BP: 26 mL (ref 19–49)
LV ESV Index A2C: 19 mL/m2
LV ESV Index A4C: 16 mL/m2
LV ESV Index BP: 17 mL/m2
LV Ejection Fraction A2C: 57 %
LV Ejection Fraction A4C: 60 %
LV Mass 2D Index: 142.5 g/m2 — AB (ref 43–95)
LV Mass 2D: 215.1 g — AB (ref 67–162)
LV RWT Ratio: 0.59
LVIDd Index: 2.91 cm/m2
LVIDd: 4.4 cm (ref 3.9–5.3)
LVIDs Index: 1.66 cm/m2
LVIDs: 2.5 cm
LVOT Area: 2.8 cm2
LVOT Diameter: 1.9 cm
LVOT Mean Gradient: 3 mm[Hg]
LVOT Peak Gradient: 4 mm[Hg]
LVOT Peak Velocity: 1.1 m/s
LVOT SV: 68.6 mL
LVOT Stroke Volume Index: 45.4 mL/m2
LVOT VTI: 24.2 cm
LVOT:AV VTI Index: 0.78
LVPWd: 1.3 cm — AB (ref 0.6–0.9)
MR Peak Gradient: 61 mm[Hg]
MR Peak Velocity: 3.9 m/s
MV A Velocity: 1.19 m/s
MV E Velocity: 1.07 m/s
MV E Wave Deceleration Time: 141.2 ms
MV E/A: 0.9
PV Max Velocity: 1 m/s
PV Peak Gradient: 4 mm[Hg]
RV Free Wall Peak S': 19 cm/s
RVIDd: 3.1 cm
RVOT Mean Gradient: 1 mm[Hg]
RVOT Peak Gradient: 2 mm[Hg]
RVOT Peak Velocity: 0.6 m/s
RVOT VTI: 13.9 cm
TAPSE: 2 cm (ref 1.7–?)
TR Max Velocity: 2.22 m/s
TR Peak Gradient: 20 mm[Hg]

## 2023-09-25 ENCOUNTER — Inpatient Hospital Stay
Admission: EM | Admit: 2023-09-25 | Discharge: 2023-10-14 | Disposition: A | Discharge status: Home / Self Care | Payer: MEDICAID | Admitting: Internal Medicine

## 2023-09-25 DIAGNOSIS — R112 Nausea with vomiting, unspecified: Secondary | ICD-10-CM

## 2023-09-25 DIAGNOSIS — K551 Chronic vascular disorders of intestine: Secondary | ICD-10-CM

## 2023-09-25 LAB — URINALYSIS WITH MICROSCOPIC
Bilirubin, Urine: NEGATIVE
Glucose, Ur: 500 mg/dL — AB
Ketones, Urine: NEGATIVE mg/dL
Leukocyte Esterase, Urine: NEGATIVE
Nitrite, Urine: NEGATIVE
Protein, UA: >300 mg/dL — AB
Specific Gravity, UA: 1.021 (ref 1.003–1.030)
Urobilinogen, Urine: 0.2 U/dL (ref 0.2–1.0)
pH, Urine: 7 (ref 5.0–8.0)

## 2023-09-25 LAB — COVID-19 & INFLUENZA COMBO
Rapid Influenza A By PCR: NOT DETECTED
Rapid Influenza B By PCR: NOT DETECTED
SARS-CoV-2, PCR: NOT DETECTED

## 2023-09-25 LAB — CBC WITH AUTO DIFFERENTIAL
Basophils %: 1 % (ref 0–1)
Basophils Absolute: 0.1 10*3/uL (ref 0.0–0.1)
Eosinophils %: 2 % (ref 0–7)
Eosinophils Absolute: 0.2 10*3/uL (ref 0.0–0.4)
Hematocrit: 33.8 % — ABNORMAL LOW (ref 35.0–47.0)
Hemoglobin: 11.4 g/dL — ABNORMAL LOW (ref 11.5–16.0)
Immature Granulocytes %: 0 % (ref 0.0–0.5)
Immature Granulocytes Absolute: 0 10*3/uL (ref 0.00–0.04)
Lymphocytes %: 19 % (ref 12–49)
Lymphocytes Absolute: 1.8 10*3/uL (ref 0.8–3.5)
MCH: 28.9 pg (ref 26.0–34.0)
MCHC: 33.7 g/dL (ref 30.0–36.5)
MCV: 85.8 fL (ref 80.0–99.0)
MPV: 9.2 fL (ref 8.9–12.9)
Monocytes %: 5 % (ref 5–13)
Monocytes Absolute: 0.5 10*3/uL (ref 0.0–1.0)
Neutrophils %: 73 % (ref 32–75)
Neutrophils Absolute: 6.9 10*3/uL (ref 1.8–8.0)
Nucleated RBCs: 0 /100{WBCs}
Platelets: 435 10*3/uL — ABNORMAL HIGH (ref 150–400)
RBC: 3.94 M/uL (ref 3.80–5.20)
RDW: 13.6 % (ref 11.5–14.5)
WBC: 9.6 10*3/uL (ref 3.6–11.0)
nRBC: 0 10*3/uL (ref 0.00–0.01)

## 2023-09-25 LAB — COMPREHENSIVE METABOLIC PANEL
ALT: 46 U/L (ref 12–78)
AST: 68 U/L — ABNORMAL HIGH (ref 15–37)
Albumin/Globulin Ratio: 0.5 — ABNORMAL LOW (ref 1.1–2.2)
Albumin: 2.2 g/dL — ABNORMAL LOW (ref 3.5–5.0)
Alk Phosphatase: 125 U/L — ABNORMAL HIGH (ref 45–117)
Anion Gap: 4 mmol/L (ref 2–12)
BUN/Creatinine Ratio: 7 — ABNORMAL LOW (ref 12–20)
BUN: 14 mg/dL (ref 6–20)
CO2: 27 mmol/L (ref 21–32)
Calcium: 8.4 mg/dL — ABNORMAL LOW (ref 8.5–10.1)
Chloride: 107 mmol/L (ref 97–108)
Creatinine: 2.12 mg/dL — ABNORMAL HIGH (ref 0.55–1.02)
Est, Glom Filt Rate: 29 mL/min/{1.73_m2} — ABNORMAL LOW (ref >60)
Globulin: 4.6 g/dL — ABNORMAL HIGH (ref 2.0–4.0)
Glucose: 265 mg/dL — ABNORMAL HIGH (ref 65–100)
Potassium: 4.5 mmol/L (ref 3.5–5.1)
Sodium: 138 mmol/L (ref 136–145)
Total Bilirubin: 0.4 mg/dL (ref 0.2–1.0)
Total Protein: 6.8 g/dL (ref 6.4–8.2)

## 2023-09-25 LAB — URINALYSIS WITH REFLEX TO CULTURE
BACTERIA, URINE: NEGATIVE /[HPF]
Bilirubin, Urine: NEGATIVE
Glucose, Ur: 500 mg/dL — AB
Ketones, Urine: NEGATIVE mg/dL
Leukocyte Esterase, Urine: NEGATIVE
Nitrite, Urine: NEGATIVE
Protein, UA: >300 mg/dL — AB
Specific Gravity, UA: 1.021 (ref 1.003–1.030)
Urobilinogen, Urine: 0.2 U/dL (ref 0.2–1.0)
pH, Urine: 7 (ref 5.0–8.0)

## 2023-09-25 LAB — POCT GLUCOSE
POC Glucose: 243 mg/dL — ABNORMAL HIGH (ref 65–117)
POC Glucose: 209 mg/dL — ABNORMAL HIGH (ref 65–117)
POC Glucose: 101 mg/dL (ref 65–117)
POC Glucose: 208 mg/dL — ABNORMAL HIGH (ref 65–117)

## 2023-09-25 LAB — RENAL FUNCTION PANEL
Albumin: 1.8 g/dL — ABNORMAL LOW (ref 3.5–5.0)
Anion Gap: 5 mmol/L (ref 2–12)
BUN/Creatinine Ratio: 8 — ABNORMAL LOW (ref 12–20)
BUN: 14 mg/dL (ref 6–20)
CO2: 28 mmol/L (ref 21–32)
Calcium: 7.6 mg/dL — ABNORMAL LOW (ref 8.5–10.1)
Chloride: 111 mmol/L — ABNORMAL HIGH (ref 97–108)
Creatinine: 1.66 mg/dL — ABNORMAL HIGH (ref 0.55–1.02)
Est, Glom Filt Rate: 40 mL/min/{1.73_m2} — ABNORMAL LOW (ref >60)
Glucose: 147 mg/dL — ABNORMAL HIGH (ref 65–100)
Phosphorus: 3.4 mg/dL (ref 2.6–4.7)
Potassium: 3.8 mmol/L (ref 3.5–5.1)
Sodium: 144 mmol/L (ref 136–145)

## 2023-09-25 LAB — LIPASE
Lipase: 26 U/L (ref 13–75)
Lipase: 17 U/L (ref 13–75)

## 2023-09-25 LAB — EXTRA TUBES HOLD

## 2023-09-25 LAB — POC PREGNANCY UR-QUAL: Preg Test, Ur: NEGATIVE

## 2023-09-25 MED ORDER — DEXTROSE 10 % IV SOLN
10 | INTRAVENOUS | Status: AC | PRN
Start: 2023-09-25 — End: ?

## 2023-09-25 MED ORDER — GLUCAGON (RDNA) 1 MG IJ KIT
1 | INTRAMUSCULAR | Status: AC | PRN
Start: 2023-09-25 — End: ?

## 2023-09-25 MED ORDER — PANTOPRAZOLE SODIUM 40 MG IV SOLR
40 | Freq: Every day | INTRAVENOUS | Status: DC
Start: 2023-09-25 — End: 2023-09-25

## 2023-09-25 MED ORDER — INSULIN LISPRO 100 UNIT/ML IJ SOLN
100 UNIT/ML | Freq: Four times a day (QID) | INTRAMUSCULAR | Status: AC
Start: 2023-09-25 — End: 2023-09-29
  Administered 2023-09-25 – 2023-09-29 (×5): 2 [IU] via SUBCUTANEOUS

## 2023-09-25 MED ORDER — DEXTROSE 10 % IV BOLUS
INTRAVENOUS | Status: DC | PRN
Start: 2023-09-25 — End: 2023-09-25

## 2023-09-25 MED ORDER — DEXTROSE 10 % IV BOLUS
INTRAVENOUS | Status: AC | PRN
Start: 2023-09-25 — End: ?

## 2023-09-25 MED ORDER — ONDANSETRON HCL 4 MG/2ML IJ SOLN
4 | Freq: Four times a day (QID) | INTRAMUSCULAR | Status: DC | PRN
Start: 2023-09-25 — End: 2023-09-25

## 2023-09-25 MED ORDER — METOCLOPRAMIDE HCL 5 MG/ML IJ SOLN
5 | Freq: Four times a day (QID) | INTRAMUSCULAR | Status: DC
Start: 2023-09-25 — End: 2023-09-25
  Administered 2023-09-25 (×2): 10 mg via INTRAVENOUS

## 2023-09-25 MED ORDER — PROCHLORPERAZINE EDISYLATE 10 MG/2ML IJ SOLN
10 MG/2ML | Freq: Four times a day (QID) | INTRAMUSCULAR | Status: DC | PRN
Start: 2023-09-25 — End: 2023-10-06
  Administered 2023-09-25 – 2023-10-06 (×14): 10 mg via INTRAVENOUS

## 2023-09-25 MED ORDER — INSULIN LISPRO 100 UNIT/ML IJ SOLN
100 | Freq: Four times a day (QID) | INTRAMUSCULAR | Status: DC
Start: 2023-09-25 — End: 2023-09-25

## 2023-09-25 MED ORDER — FAMOTIDINE (PF) 20 MG/2ML IV SOLN
20 MG/2ML | Freq: Every day | INTRAVENOUS | Status: DC
Start: 2023-09-25 — End: 2023-09-26
  Administered 2023-09-25 – 2023-09-26 (×2): 10 mg via INTRAVENOUS

## 2023-09-25 MED ORDER — DICYCLOMINE HCL 10 MG/ML IM SOLN
10 MG/ML | Freq: Four times a day (QID) | INTRAMUSCULAR | Status: DC | PRN
Start: 2023-09-25 — End: 2023-10-11
  Administered 2023-09-25 – 2023-10-10 (×8): 20 mg via INTRAMUSCULAR

## 2023-09-25 MED ORDER — METOCLOPRAMIDE HCL 5 MG/ML IJ SOLN
5 | INTRAMUSCULAR | Status: AC
Start: 2023-09-25 — End: 2023-09-25
  Administered 2023-09-25: 06:00:00 10 mg via INTRAVENOUS

## 2023-09-25 MED ORDER — METOCLOPRAMIDE HCL 5 MG/ML IJ SOLN
5 MG/ML | Freq: Four times a day (QID) | INTRAMUSCULAR | Status: DC
Start: 2023-09-25 — End: 2023-09-26
  Administered 2023-09-25 – 2023-09-26 (×5): 5 mg via INTRAVENOUS

## 2023-09-25 MED ORDER — GLUCOSE 4 G PO CHEW
4 | ORAL | Status: AC | PRN
Start: 2023-09-25 — End: ?

## 2023-09-25 MED ORDER — HYDROMORPHONE HCL PF 1 MG/ML IJ SOLN
1 | INTRAMUSCULAR | Status: AC
Start: 2023-09-25 — End: 2023-09-25
  Administered 2023-09-25: 07:00:00 0.5 mg via INTRAVENOUS

## 2023-09-25 MED ORDER — GLUCOSE 4 G PO CHEW
4 | ORAL | Status: DC | PRN
Start: 2023-09-25 — End: 2023-09-25

## 2023-09-25 MED ORDER — METOPROLOL TARTRATE 5 MG/5ML IV SOLN
55 MG/ML | Freq: Four times a day (QID) | INTRAVENOUS | Status: AC
Start: 2023-09-25 — End: 2023-09-26
  Administered 2023-09-25 – 2023-09-26 (×5): 5 mg via INTRAVENOUS

## 2023-09-25 MED ORDER — SODIUM CHLORIDE 0.9 % IV BOLUS
0.9 | Freq: Once | INTRAVENOUS | Status: AC
Start: 2023-09-25 — End: 2023-09-25
  Administered 2023-09-25: 06:00:00 1000 mL via INTRAVENOUS

## 2023-09-25 MED ORDER — LORAZEPAM 2 MG/ML IJ SOLN
2 MG/ML | Freq: Four times a day (QID) | INTRAMUSCULAR | Status: DC | PRN
Start: 2023-09-25 — End: 2023-09-30
  Administered 2023-09-26 (×2): 0.5 mg via INTRAVENOUS

## 2023-09-25 MED ORDER — SODIUM CHLORIDE 0.9 % IV BOLUS
0.9 | Freq: Once | INTRAVENOUS | Status: AC
Start: 2023-09-25 — End: 2023-09-25
  Administered 2023-09-25: 17:00:00 500 mL via INTRAVENOUS

## 2023-09-25 MED ORDER — SODIUM CHLORIDE 0.9 % IV SOLN
0.9 | INTRAVENOUS | Status: AC | PRN
Start: 2023-09-25 — End: ?
  Administered 2023-10-05: 14:00:00 via INTRAVENOUS

## 2023-09-25 MED ORDER — AMLODIPINE BESYLATE 5 MG PO TABS
5 MG | Freq: Every day | ORAL | Status: DC
Start: 2023-09-25 — End: 2023-10-11
  Administered 2023-09-25 – 2023-10-08 (×15): 10 mg via ORAL

## 2023-09-25 MED ORDER — HYDROMORPHONE HCL PF 1 MG/ML IJ SOLN
1 | INTRAMUSCULAR | Status: AC
Start: 2023-09-25 — End: 2023-09-25
  Administered 2023-09-25: 06:00:00 0.5 mg via INTRAVENOUS

## 2023-09-25 MED ORDER — LINACLOTIDE 145 MCG PO CAPS
145 MCG | Freq: Every day | ORAL | Status: DC
Start: 2023-09-25 — End: 2023-09-26
  Administered 2023-09-26: 14:00:00 145 ug via ORAL

## 2023-09-25 MED ORDER — BUSPIRONE HCL 10 MG PO TABS
10 MG | Freq: Three times a day (TID) | ORAL | Status: DC
Start: 2023-09-25 — End: 2023-09-26
  Administered 2023-09-25 – 2023-09-27 (×5): 10 mg via ORAL

## 2023-09-25 MED ORDER — ENOXAPARIN SODIUM 30 MG/0.3ML IJ SOSY
300.3 MG/0.3ML | Freq: Every day | INTRAMUSCULAR | Status: AC
Start: 2023-09-25 — End: 2023-09-26
  Administered 2023-09-25 – 2023-09-26 (×2): 30 mg via SUBCUTANEOUS

## 2023-09-25 MED ORDER — SUCRALFATE 1 G PO TABS
1 GM | Freq: Four times a day (QID) | ORAL | Status: DC
Start: 2023-09-25 — End: 2023-09-26
  Administered 2023-09-25 – 2023-09-26 (×6): 1 g via ORAL

## 2023-09-25 MED ORDER — ACETAMINOPHEN 650 MG RE SUPP
650 | Freq: Four times a day (QID) | RECTAL | Status: DC | PRN
Start: 2023-09-25 — End: 2023-10-11

## 2023-09-25 MED ORDER — INSULIN GLARGINE 100 UNIT/ML SC SOLN
100 UNIT/ML | Freq: Every day | SUBCUTANEOUS | Status: AC
Start: 2023-09-25 — End: 2023-09-26
  Administered 2023-09-25 – 2023-09-26 (×2): 9 [IU]/kg via SUBCUTANEOUS

## 2023-09-25 MED ORDER — GLUCAGON (RDNA) 1 MG IJ KIT
1 | INTRAMUSCULAR | Status: DC | PRN
Start: 2023-09-25 — End: 2023-09-25

## 2023-09-25 MED ORDER — PANTOPRAZOLE SODIUM 40 MG IV SOLR
40 | Freq: Two times a day (BID) | INTRAVENOUS | Status: AC
Start: 2023-09-25 — End: ?
  Administered 2023-09-25 – 2023-10-14 (×37): 40 mg via INTRAVENOUS

## 2023-09-25 MED ORDER — NORMAL SALINE FLUSH 0.9 % IV SOLN
0.9 | INTRAVENOUS | Status: AC | PRN
Start: 2023-09-25 — End: ?
  Administered 2023-10-01 – 2023-10-02 (×4): 10 mL via INTRAVENOUS

## 2023-09-25 MED ORDER — SERTRALINE HCL 50 MG PO TABS
50 MG | Freq: Every day | ORAL | Status: DC
Start: 2023-09-25 — End: 2023-09-26
  Administered 2023-09-25 – 2023-09-26 (×2): 25 mg via ORAL

## 2023-09-25 MED ORDER — ACETAMINOPHEN 325 MG PO TABS
325 | Freq: Four times a day (QID) | ORAL | Status: DC | PRN
Start: 2023-09-25 — End: 2023-10-11
  Administered 2023-09-26 – 2023-10-08 (×3): 650 mg via ORAL

## 2023-09-25 MED ORDER — ONDANSETRON 4 MG PO TBDP
4 | Freq: Three times a day (TID) | ORAL | Status: DC | PRN
Start: 2023-09-25 — End: 2023-09-25

## 2023-09-25 MED ORDER — NORMAL SALINE FLUSH 0.9 % IV SOLN
0.9 | Freq: Two times a day (BID) | INTRAVENOUS | Status: AC
Start: 2023-09-25 — End: ?
  Administered 2023-09-25 – 2023-09-30 (×9): 10 mL via INTRAVENOUS
  Administered 2023-10-01: 16:00:00 5 mL via INTRAVENOUS
  Administered 2023-10-01 – 2023-10-14 (×25): 10 mL via INTRAVENOUS

## 2023-09-25 MED ORDER — POLYETHYLENE GLYCOL 3350 17 G PO PACK
17 g | Freq: Every day | ORAL | Status: AC | PRN
Start: 2023-09-25 — End: 2023-10-06

## 2023-09-25 MED ORDER — DICYCLOMINE HCL 20 MG PO TABS
20 | Freq: Once | ORAL | Status: AC
Start: 2023-09-25 — End: 2023-09-25
  Administered 2023-09-25: 11:00:00 20 mg via ORAL

## 2023-09-25 MED ORDER — ONDANSETRON HCL 4 MG/2ML IJ SOLN
4 | Freq: Once | INTRAMUSCULAR | Status: AC
Start: 2023-09-25 — End: 2023-09-25
  Administered 2023-09-25: 06:00:00 4 mg via INTRAVENOUS

## 2023-09-25 MED ORDER — MORPHINE SULFATE (PF) 2 MG/ML IJ SOLN
2 | Freq: Once | INTRAMUSCULAR | Status: AC
Start: 2023-09-25 — End: 2023-09-25
  Administered 2023-09-25: 11:00:00 2 mg via INTRAVENOUS

## 2023-09-25 MED ORDER — LABETALOL HCL 5 MG/ML IV SOLN
5 MG/ML | Freq: Four times a day (QID) | INTRAVENOUS | Status: AC | PRN
Start: 2023-09-25 — End: 2023-10-02
  Administered 2023-09-25 – 2023-09-27 (×4): 10 mg via INTRAVENOUS

## 2023-09-25 MED ORDER — HYDROMORPHONE HCL PF 1 MG/ML IJ SOLN
1 | Freq: Once | INTRAMUSCULAR | Status: AC
Start: 2023-09-25 — End: 2023-09-25
  Administered 2023-09-25: 21:00:00 0.5 mg via INTRAVENOUS

## 2023-09-25 MED ORDER — HYDROMORPHONE HCL PF 1 MG/ML IJ SOLN
1 | Freq: Once | INTRAMUSCULAR | Status: AC
Start: 2023-09-25 — End: 2023-09-25
  Administered 2023-09-25: 17:00:00 0.5 mg via INTRAVENOUS

## 2023-09-25 MED ORDER — DEXTROSE 10 % IV SOLN
10 | INTRAVENOUS | Status: DC | PRN
Start: 2023-09-25 — End: 2023-09-25

## 2023-09-25 MED ORDER — SODIUM CHLORIDE 0.9 % IV SOLN
0.9 | INTRAVENOUS | Status: AC
Start: 2023-09-25 — End: 2023-09-26
  Administered 2023-09-25 (×2): via INTRAVENOUS

## 2023-09-25 MED FILL — ENOXAPARIN SODIUM 30 MG/0.3ML IJ SOSY: 30 MG/0.3ML | INTRAMUSCULAR | Qty: 0.3

## 2023-09-25 MED FILL — HYDROMORPHONE HCL 1 MG/ML IJ SOLN: 1 MG/ML | INTRAMUSCULAR | Qty: 1

## 2023-09-25 MED FILL — METOPROLOL TARTRATE 5 MG/5ML IV SOLN: 5 MG/ML | INTRAVENOUS | Qty: 5

## 2023-09-25 MED FILL — FAMOTIDINE (PF) 20 MG/2ML IV SOLN: 20 MG/2ML | INTRAVENOUS | Qty: 2

## 2023-09-25 MED FILL — LANTUS 100 UNIT/ML SC SOLN: 100 UNIT/ML | SUBCUTANEOUS | Qty: 9

## 2023-09-25 MED FILL — SODIUM CHLORIDE 0.9 % IV SOLN: 0.9 % | INTRAVENOUS | Qty: 500

## 2023-09-25 MED FILL — SODIUM CHLORIDE 0.9 % IV SOLN: 0.9 % | INTRAVENOUS | Qty: 1000

## 2023-09-25 MED FILL — DICYCLOMINE HCL 20 MG PO TABS: 20 MG | ORAL | Qty: 1

## 2023-09-25 MED FILL — BUSPIRONE HCL 10 MG PO TABS: 10 MG | ORAL | Qty: 1

## 2023-09-25 MED FILL — DEXTROSE 10 % IV SOLN: 10 % | INTRAVENOUS | Qty: 500

## 2023-09-25 MED FILL — PANTOPRAZOLE SODIUM 40 MG IV SOLR: 40 MG | INTRAVENOUS | Qty: 40

## 2023-09-25 MED FILL — ONDANSETRON HCL 4 MG/2ML IJ SOLN: 4 MG/2ML | INTRAMUSCULAR | Qty: 2

## 2023-09-25 MED FILL — AMLODIPINE BESYLATE 5 MG PO TABS: 5 MG | ORAL | Qty: 2

## 2023-09-25 MED FILL — METOCLOPRAMIDE HCL 5 MG/ML IJ SOLN: 5 MG/ML | INTRAMUSCULAR | Qty: 2

## 2023-09-25 MED FILL — DICYCLOMINE HCL 10 MG/ML IM SOLN: 10 MG/ML | INTRAMUSCULAR | Qty: 2

## 2023-09-25 MED FILL — SUCRALFATE 1 G PO TABS: 1 GM | ORAL | Qty: 1

## 2023-09-25 MED FILL — MORPHINE SULFATE 2 MG/ML IJ SOLN: 2 mg/mL | INTRAMUSCULAR | Qty: 1

## 2023-09-25 MED FILL — PROCHLORPERAZINE EDISYLATE 10 MG/2ML IJ SOLN: 10 MG/2ML | INTRAMUSCULAR | Qty: 2

## 2023-09-25 MED FILL — LABETALOL HCL 5 MG/ML IV SOLN: 5 MG/ML | INTRAVENOUS | Qty: 4

## 2023-09-25 MED FILL — SERTRALINE HCL 50 MG PO TABS: 50 MG | ORAL | Qty: 1

## 2023-09-25 MED FILL — INSULIN LISPRO 100 UNIT/ML IJ SOLN: 100 UNIT/ML | INTRAMUSCULAR | Qty: 1

## 2023-09-25 MED FILL — INSULIN LISPRO 100 UNIT/ML IJ SOLN: 100 UNIT/ML | INTRAMUSCULAR | Qty: 2

## 2023-09-25 MED FILL — BD POSIFLUSH 0.9 % IV SOLN: 0.9 % | INTRAVENOUS | Qty: 40

## 2023-09-25 NOTE — Care Coordination-Inpatient (Addendum)
 Case management follow up    Provider concerned with patients prescriptions not being picked up after past hospital discharges.     Case manager call to contact, Daisy (dtr).  Daughter states they do not pick up her medications because of the cost, at time

## 2023-09-25 NOTE — Consults (Signed)
 PSYCHIATRY CONSULT NOTE    REASON FOR CONSULT: Behavioral evaluation, severe anxiety, panic attack  Chief complaint: Feeling anxious, nervous    HISTORY OF PRESENTING COMPLAINT:  Melinda [name] is a 42 y.o. Hispanic / Latino female who is seen for

## 2023-09-25 NOTE — Progress Notes (Signed)
 [facility] Pharmacy Dosing Services:  Per [facility] renal adjustment protocol, pharmacist renally adjusted Reglan 10mg  IV every 6 hours to 5mg  IV every 6 hours for estimated CrCl of 34 ml/min.     Thank you,  Dot Lanes, Pharm.D.  Clinical Pharmacist  [facility]

## 2023-09-25 NOTE — Progress Notes (Signed)
 Hospitalist Progress Note  Reggy Eye, MD  Answering service: [phone] OR [phone] from in house phone        Date of Service:  09/25/2023  NAME:  Melinda [name]

## 2023-09-25 NOTE — ED Provider Notes (Signed)
 [facility] EMERGENCY DEPT  EMERGENCY DEPARTMENT ENCOUNTER      Pt Name: Melinda [name]  MRN: [id number]  Birthdate Mar 08, 1981  Date of evaluation: 09/25/2023  Provider: Sherril Croon, DO    CHIEF COMPLAINT       Chief Complaint   Patient presents

## 2023-09-25 NOTE — Consults (Signed)
 Session ID: [id number]  Language: Spanish  Interpreter ID: [phone]  Interpreter Name: Donnetta Simpers

## 2023-09-25 NOTE — H&P (Addendum)
 Hospitalist Admission Note    NAME: Melinda [name]   DOB:  Oct 21, 1980   MRN:  [id number]     Date/Time:  09/25/2023 5:42 AM    Patient PCP: Orlean Bradford, APRN - NP    Please note that this dictation was completed with Dragon, the computer voice

## 2023-09-25 NOTE — Consults (Signed)
 Marcene Brawn, MD  [phone] office    I am not permitted to use "perfect serve" use above for contact, thanks.   Gastroenterology Consultation Note      Admit Date: 09/25/2023  Consult Date: 09/25/2023   I greatly appreciate your

## 2023-09-25 NOTE — ED Triage Notes (Signed)
 Pt has been in experiencing bilateral upper quadrant abdominal pain and vomiting since last night

## 2023-09-25 NOTE — Consults (Signed)
 Session ID: [id number]  Language: Spanish  Interpreter ID: #[id number]  Interpreter Name: Venancio Poisson

## 2023-09-25 NOTE — Consults (Signed)
 Session ID: [id number]  Language: Spanish  Interpreter ID: #[id number]  Interpreter Name: Jari Favre

## 2023-09-26 ENCOUNTER — Observation Stay: Admit: 2023-09-26 | Payer: MEDICAID | Primary: Adult Health

## 2023-09-26 LAB — POCT GLUCOSE
POC Glucose: 149 mg/dL — ABNORMAL HIGH (ref 65–117)
POC Glucose: 137 mg/dL — ABNORMAL HIGH (ref 65–117)
POC Glucose: 182 mg/dL — ABNORMAL HIGH (ref 65–117)
POC Glucose: 190 mg/dL — ABNORMAL HIGH (ref 65–117)
POC Glucose: 176 mg/dL — ABNORMAL HIGH (ref 65–117)

## 2023-09-26 MED ORDER — HYDROMORPHONE HCL 2 MG PO TABS
2 | ORAL | Status: DC | PRN
Start: 2023-09-26 — End: 2023-09-27

## 2023-09-26 MED ORDER — METOCLOPRAMIDE HCL 5 MG/ML IJ SOLN
5 | Freq: Once | INTRAMUSCULAR | Status: DC
Start: 2023-09-26 — End: 2023-09-26

## 2023-09-26 MED ORDER — HYDRALAZINE HCL 20 MG/ML IJ SOLN
20 MG/ML | Freq: Four times a day (QID) | INTRAMUSCULAR | Status: DC | PRN
Start: 2023-09-26 — End: 2023-10-14
  Administered 2023-09-27 – 2023-10-12 (×9): 20 mg via INTRAVENOUS

## 2023-09-26 MED ORDER — ENOXAPARIN SODIUM 40 MG/0.4ML IJ SOSY
40 | Freq: Every day | INTRAMUSCULAR | Status: DC
Start: 2023-09-26 — End: 2023-10-01
  Administered 2023-09-27 – 2023-09-29 (×3): 40 mg via SUBCUTANEOUS

## 2023-09-26 MED ORDER — HYDROMORPHONE HCL PF 1 MG/ML IJ SOLN
1 | INTRAMUSCULAR | Status: DC | PRN
Start: 2023-09-26 — End: 2023-09-28
  Administered 2023-09-26 – 2023-09-28 (×3): 1 mg via INTRAVENOUS

## 2023-09-26 MED ORDER — METOPROLOL TARTRATE 50 MG PO TABS
50 | Freq: Two times a day (BID) | ORAL | Status: DC
Start: 2023-09-26 — End: 2023-09-26

## 2023-09-26 MED ORDER — METOPROLOL TARTRATE 50 MG PO TABS
50 MG | Freq: Two times a day (BID) | ORAL | Status: DC
Start: 2023-09-26 — End: 2023-10-10
  Administered 2023-09-27 – 2023-10-10 (×25): 50 mg via ORAL

## 2023-09-26 MED ORDER — HYDROMORPHONE HCL PF 1 MG/ML IJ SOLN
1 | Freq: Once | INTRAMUSCULAR | Status: AC
Start: 2023-09-26 — End: 2023-09-26
  Administered 2023-09-26: 18:00:00 0.5 mg via INTRAVENOUS

## 2023-09-26 MED ORDER — METOPROLOL TARTRATE 25 MG PO TABS
25 | Freq: Two times a day (BID) | ORAL | Status: DC
Start: 2023-09-26 — End: 2023-09-26
  Administered 2023-09-26: 21:00:00 25 mg via ORAL

## 2023-09-26 MED ORDER — SODIUM CHLORIDE 0.9 % IV SOLN
0.9 | INTRAVENOUS | Status: AC
Start: 2023-09-26 — End: 2023-09-27
  Administered 2023-09-26: 19:00:00 via INTRAVENOUS

## 2023-09-26 MED ORDER — HYDROMORPHONE HCL 2 MG PO TABS
2 | ORAL | Status: DC | PRN
Start: 2023-09-26 — End: 2023-09-27
  Administered 2023-09-26 – 2023-09-27 (×5): 4 mg via ORAL

## 2023-09-26 MED ORDER — DICYCLOMINE HCL 10 MG/ML IM SOLN
10 | Freq: Once | INTRAMUSCULAR | Status: AC
Start: 2023-09-26 — End: 2023-09-26
  Administered 2023-09-26: 18:00:00 20 mg via INTRAMUSCULAR

## 2023-09-26 MED ORDER — INSULIN GLARGINE 100 UNIT/ML SC SOLN
100 | Freq: Every day | SUBCUTANEOUS | Status: DC
Start: 2023-09-26 — End: 2023-09-27

## 2023-09-26 MED FILL — METOPROLOL TARTRATE 25 MG PO TABS: 25 MG | ORAL | Qty: 1

## 2023-09-26 MED FILL — SERTRALINE HCL 50 MG PO TABS: 50 MG | ORAL | Qty: 1

## 2023-09-26 MED FILL — METOCLOPRAMIDE HCL 5 MG/ML IJ SOLN: 5 MG/ML | INTRAMUSCULAR | Qty: 2

## 2023-09-26 MED FILL — SODIUM CHLORIDE 0.9 % IV SOLN: 0.9 % | INTRAVENOUS | Qty: 1000

## 2023-09-26 MED FILL — DICYCLOMINE HCL 10 MG/ML IM SOLN: 10 MG/ML | INTRAMUSCULAR | Qty: 2

## 2023-09-26 MED FILL — FAMOTIDINE (PF) 20 MG/2ML IV SOLN: 20 MG/2ML | INTRAVENOUS | Qty: 2

## 2023-09-26 MED FILL — INSULIN LISPRO 100 UNIT/ML IJ SOLN: 100 UNIT/ML | INTRAMUSCULAR | Qty: 2

## 2023-09-26 MED FILL — SUCRALFATE 1 G PO TABS: 1 GM | ORAL | Qty: 1

## 2023-09-26 MED FILL — LANTUS 100 UNIT/ML SC SOLN: 100 UNIT/ML | SUBCUTANEOUS | Qty: 9

## 2023-09-26 MED FILL — PANTOPRAZOLE SODIUM 40 MG IV SOLR: 40 MG | INTRAVENOUS | Qty: 40

## 2023-09-26 MED FILL — LINZESS 145 MCG PO CAPS: 145 MCG | ORAL | Qty: 1

## 2023-09-26 MED FILL — BUSPIRONE HCL 10 MG PO TABS: 10 MG | ORAL | Qty: 1

## 2023-09-26 MED FILL — AMLODIPINE BESYLATE 5 MG PO TABS: 5 MG | ORAL | Qty: 2

## 2023-09-26 MED FILL — PROCHLORPERAZINE EDISYLATE 10 MG/2ML IJ SOLN: 10 MG/2ML | INTRAMUSCULAR | Qty: 2

## 2023-09-26 MED FILL — LORAZEPAM 2 MG/ML IJ SOLN: 2 MG/ML | INTRAMUSCULAR | Qty: 1

## 2023-09-26 MED FILL — HYDROMORPHONE HCL 2 MG PO TABS: 2 MG | ORAL | Qty: 2

## 2023-09-26 MED FILL — METOPROLOL TARTRATE 5 MG/5ML IV SOLN: 5 MG/ML | INTRAVENOUS | Qty: 5

## 2023-09-26 MED FILL — ENOXAPARIN SODIUM 30 MG/0.3ML IJ SOSY: 30 MG/0.3ML | INTRAMUSCULAR | Qty: 0.3

## 2023-09-26 MED FILL — HYDROMORPHONE HCL 1 MG/ML IJ SOLN: 1 MG/ML | INTRAMUSCULAR | Qty: 1

## 2023-09-26 MED FILL — ACETAMINOPHEN 325 MG PO TABS: 325 MG | ORAL | Qty: 2

## 2023-09-26 MED FILL — LABETALOL HCL 5 MG/ML IV SOLN: 5 MG/ML | INTRAVENOUS | Qty: 4

## 2023-09-26 NOTE — Consults (Signed)
 Session ID: [id number]  Language: Spanish  Interpreter ID: #[id number]  Interpreter Name: Ashby Dawes

## 2023-09-26 NOTE — Progress Notes (Deleted)
 Patient/caregivers speak Spanish  as their preferred language for their healthcare communication. For safe communication, use the [employer] interpreter carts or call:    Senior Interpreter/Navigator Jason Fila at [phone] or   [employer] phone services for [place]. Fra

## 2023-09-26 NOTE — Progress Notes (Addendum)
 [place] [facility]  [address] [place], [place] [address]  [phone]        Hospitalist Progress Note      NAME: Melinda [name]   DOB:  1981-05-27  MRM:  [id number]    Date/Time: 09/26/2023  2:25 PM         Subjective:

## 2023-09-26 NOTE — Progress Notes (Signed)
 Devonia Farro, PA-C                       [phone] office             Monday-Friday 8:00 am-4:30 pm  I am not permitted to use "perfect serve" use above

## 2023-09-26 NOTE — Progress Notes (Signed)
 Patient/caregivers speak Spanish  as their preferred language for their healthcare communication. For safe communication, use the AMN interpreter carts or call:    Senior Interpreter/Navigator Jason Fila at [phone] or   AMN phone services for [place]. Fra

## 2023-09-26 NOTE — Consults (Signed)
 Session ID: [id number]  Language: Spanish  Interpreter ID: #[id number]  Interpreter Name: Johnny Bridge

## 2023-09-26 NOTE — Plan of Care (Signed)
Problem: Safety - Adult  Goal: Free from fall injury  Outcome: Progressing     Problem: Chronic Conditions and Co-morbidities  Goal: Patient's chronic conditions and co-morbidity symptoms are monitored and maintained or improved  Outcome: Not Progressing

## 2023-09-26 NOTE — Progress Notes (Signed)
 [facility] Lovenox Dosing 09/26/23  Lovenox dose change per protocol  Physician: Dr. Sudie Bailey    [facility] Protocol  Enoxaparin prophylaxis dosing (medically ill, surgical patients)   Patient Weight (kg)     50 and below 51 - 100 101 - 150 151 - 174 175 or greater

## 2023-09-26 NOTE — Consults (Signed)
 Session ID: [id number]  Language: Spanish  Interpreter ID: #[id number]  Interpreter Name: Annabelle Harman

## 2023-09-26 NOTE — Plan of Care (Signed)
 Problem: Chronic Conditions and Co-morbidities  Goal: Patient's chronic conditions and co-morbidity symptoms are monitored and maintained or improved  Outcome: Progressing     Problem: Pain  Goal: Verbalizes/displays adequate comfort level or baseline co

## 2023-09-27 LAB — URINE DRUG SCREEN
Amphetamine, Urine: NEGATIVE
Barbiturates, Urine: NEGATIVE
Benzodiazepines, Urine: NEGATIVE
Cocaine, Urine: NEGATIVE
Methadone, Urine: NEGATIVE
Opiates, Urine: POSITIVE — AB
Phencyclidine, Urine: NEGATIVE
THC, TH-Cannabinol, Urine: NEGATIVE

## 2023-09-27 LAB — CBC WITH AUTO DIFFERENTIAL
Basophils %: 1 % (ref 0–1)
Basophils %: 1 % (ref 0–1)
Basophils Absolute: 0.1 10*3/uL (ref 0.0–0.1)
Basophils Absolute: 0.1 10*3/uL (ref 0.0–0.1)
Eosinophils %: 0 % (ref 0–7)
Eosinophils %: 2 % (ref 0–7)
Eosinophils Absolute: 0 10*3/uL (ref 0.0–0.4)
Eosinophils Absolute: 0.1 10*3/uL (ref 0.0–0.4)
Hematocrit: 27.7 % — ABNORMAL LOW (ref 35.0–47.0)
Hematocrit: 27.7 % — ABNORMAL LOW (ref 35.0–47.0)
Hemoglobin: 9.2 g/dL — ABNORMAL LOW (ref 11.5–16.0)
Hemoglobin: 9.2 g/dL — ABNORMAL LOW (ref 11.5–16.0)
Immature Granulocytes %: 0 % (ref 0.0–0.5)
Immature Granulocytes %: 0 % (ref 0.0–0.5)
Immature Granulocytes Absolute: 0 10*3/uL (ref 0.00–0.04)
Immature Granulocytes Absolute: 0 10*3/uL (ref 0.00–0.04)
Lymphocytes %: 13 % (ref 12–49)
Lymphocytes %: 23 % (ref 12–49)
Lymphocytes Absolute: 1 10*3/uL (ref 0.8–3.5)
Lymphocytes Absolute: 1.7 10*3/uL (ref 0.8–3.5)
MCH: 28.9 pg (ref 26.0–34.0)
MCH: 28.6 pg (ref 26.0–34.0)
MCHC: 33.2 g/dL (ref 30.0–36.5)
MCHC: 33.2 g/dL (ref 30.0–36.5)
MCV: 87.1 fL (ref 80.0–99.0)
MCV: 86 fL (ref 80.0–99.0)
MPV: 8.4 fL — ABNORMAL LOW (ref 8.9–12.9)
Monocytes %: 4 % — ABNORMAL LOW (ref 5–13)
Monocytes %: 7 % (ref 5–13)
Monocytes Absolute: 0.3 10*3/uL (ref 0.0–1.0)
Monocytes Absolute: 0.5 10*3/uL (ref 0.0–1.0)
Neutrophils %: 82 % — ABNORMAL HIGH (ref 32–75)
Neutrophils %: 67 % (ref 32–75)
Neutrophils Absolute: 6.3 10*3/uL (ref 1.8–8.0)
Neutrophils Absolute: 4.9 10*3/uL (ref 1.8–8.0)
Nucleated RBCs: 0 /100{WBCs}
Nucleated RBCs: 0 /100{WBCs}
Platelets: 358 10*3/uL (ref 150–400)
Platelets: 453 10*3/uL — ABNORMAL HIGH (ref 150–400)
RBC: 3.18 M/uL — ABNORMAL LOW (ref 3.80–5.20)
RBC: 3.22 M/uL — ABNORMAL LOW (ref 3.80–5.20)
RDW: 13.5 % (ref 11.5–14.5)
RDW: 13.8 % (ref 11.5–14.5)
WBC: 7.7 10*3/uL (ref 3.6–11.0)
WBC: 7.3 10*3/uL (ref 3.6–11.0)
nRBC: 0 10*3/uL (ref 0.00–0.01)
nRBC: 0 10*3/uL (ref 0.00–0.01)

## 2023-09-27 LAB — BASIC METABOLIC PANEL
Anion Gap: 5 mmol/L (ref 2–12)
Anion Gap: 8 mmol/L (ref 2–12)
BUN/Creatinine Ratio: 9 — ABNORMAL LOW (ref 12–20)
BUN/Creatinine Ratio: 8 — ABNORMAL LOW (ref 12–20)
BUN: 12 mg/dL (ref 6–20)
BUN: 12 mg/dL (ref 6–20)
CO2: 25 mmol/L (ref 21–32)
CO2: 24 mmol/L (ref 21–32)
Calcium: 8 mg/dL — ABNORMAL LOW (ref 8.5–10.1)
Calcium: 8.5 mg/dL (ref 8.5–10.1)
Chloride: 109 mmol/L — ABNORMAL HIGH (ref 97–108)
Chloride: 107 mmol/L (ref 97–108)
Creatinine: 1.39 mg/dL — ABNORMAL HIGH (ref 0.55–1.02)
Creatinine: 1.55 mg/dL — ABNORMAL HIGH (ref 0.55–1.02)
Est, Glom Filt Rate: 49 mL/min/{1.73_m2} — ABNORMAL LOW (ref >60)
Est, Glom Filt Rate: 43 mL/min/{1.73_m2} — ABNORMAL LOW (ref >60)
Glucose: 174 mg/dL — ABNORMAL HIGH (ref 65–100)
Glucose: 164 mg/dL — ABNORMAL HIGH (ref 65–100)
Potassium: 3.3 mmol/L — ABNORMAL LOW (ref 3.5–5.1)
Potassium: 3.6 mmol/L (ref 3.5–5.1)
Sodium: 139 mmol/L (ref 136–145)
Sodium: 139 mmol/L (ref 136–145)

## 2023-09-27 LAB — HEPATIC FUNCTION PANEL
ALT: 37 U/L (ref 12–78)
AST: 45 U/L — ABNORMAL HIGH (ref 15–37)
Albumin/Globulin Ratio: 0.5 — ABNORMAL LOW (ref 1.1–2.2)
Albumin: 1.9 g/dL — ABNORMAL LOW (ref 3.5–5.0)
Alk Phosphatase: 106 U/L (ref 45–117)
Bilirubin, Direct: 0.1 mg/dL (ref 0.0–0.2)
Globulin: 3.6 g/dL (ref 2.0–4.0)
Total Bilirubin: 0.2 mg/dL (ref 0.2–1.0)
Total Protein: 5.5 g/dL — ABNORMAL LOW (ref 6.4–8.2)

## 2023-09-27 LAB — POCT GLUCOSE
POC Glucose: 195 mg/dL — ABNORMAL HIGH (ref 65–117)
POC Glucose: 140 mg/dL — ABNORMAL HIGH (ref 65–117)
POC Glucose: 153 mg/dL — ABNORMAL HIGH (ref 65–117)
POC Glucose: 169 mg/dL — ABNORMAL HIGH (ref 65–117)
POC Glucose: 131 mg/dL — ABNORMAL HIGH (ref 65–117)

## 2023-09-27 LAB — HEMOGLOBIN A1C
Estimated Avg Glucose: 169 mg/dL
Hemoglobin A1C: 7.5 % — ABNORMAL HIGH (ref 4.0–5.6)

## 2023-09-27 LAB — MAGNESIUM: Magnesium: 1.6 mg/dL (ref 1.6–2.4)

## 2023-09-27 LAB — LIPASE: Lipase: 13 U/L (ref 13–75)

## 2023-09-27 LAB — PHOSPHORUS: Phosphorus: 3.7 mg/dL (ref 2.6–4.7)

## 2023-09-27 MED ORDER — METOCLOPRAMIDE HCL 5 MG/ML IJ SOLN
5 | Freq: Four times a day (QID) | INTRAMUSCULAR | Status: DC
Start: 2023-09-27 — End: 2023-09-26

## 2023-09-27 MED ORDER — METOCLOPRAMIDE HCL 5 MG/ML IJ SOLN
5 | Freq: Four times a day (QID) | INTRAMUSCULAR | Status: DC
Start: 2023-09-27 — End: 2023-09-27
  Administered 2023-09-27 (×3): 5 mg via INTRAVENOUS

## 2023-09-27 MED ORDER — HYDROMORPHONE HCL PF 1 MG/ML IJ SOLN
1 | Freq: Once | INTRAMUSCULAR | Status: AC
Start: 2023-09-27 — End: 2023-09-27
  Administered 2023-09-27: 17:00:00 1 mg via INTRAVENOUS

## 2023-09-27 MED ORDER — SODIUM CHLORIDE 0.9 % IV SOLN
0.9 | INTRAVENOUS | Status: DC
Start: 2023-09-27 — End: 2023-09-28
  Administered 2023-09-27 – 2023-09-28 (×2): via INTRAVENOUS

## 2023-09-27 MED ORDER — BUSPIRONE HCL 15 MG PO TABS
15 MG | Freq: Three times a day (TID) | ORAL | Status: DC
Start: 2023-09-27 — End: 2023-10-14
  Administered 2023-09-27 – 2023-10-14 (×41): 15 mg via ORAL

## 2023-09-27 MED ORDER — HYDROMORPHONE HCL 2 MG PO TABS
2 | ORAL | Status: DC | PRN
Start: 2023-09-27 — End: 2023-09-28
  Administered 2023-09-28: 16:00:00 4 mg via ORAL

## 2023-09-27 MED ORDER — HYDROXYZINE HCL 25 MG PO TABS
25 MG | Freq: Three times a day (TID) | ORAL | Status: DC | PRN
Start: 2023-09-27 — End: 2023-10-11

## 2023-09-27 MED ORDER — SERTRALINE HCL 50 MG PO TABS
50 | Freq: Every day | ORAL | Status: DC
Start: 2023-09-27 — End: 2023-09-27
  Administered 2023-09-27: 15:00:00 50 mg via ORAL

## 2023-09-27 MED ORDER — LINACLOTIDE 145 MCG PO CAPS
145 MCG | Freq: Every day | ORAL | Status: DC
Start: 2023-09-27 — End: 2023-10-14
  Administered 2023-09-27 – 2023-10-14 (×11): 290 ug via ORAL

## 2023-09-27 MED ORDER — DULOXETINE HCL 30 MG PO CPEP
30 | Freq: Every day | ORAL | Status: DC
Start: 2023-09-27 — End: 2023-09-29
  Administered 2023-09-27 – 2023-09-29 (×3): 30 mg via ORAL

## 2023-09-27 MED ORDER — LABETALOL HCL 5 MG/ML IV SOLN
5 | Freq: Once | INTRAVENOUS | Status: AC
Start: 2023-09-27 — End: 2023-09-27
  Administered 2023-09-27: 06:00:00 10 mg via INTRAVENOUS

## 2023-09-27 MED ORDER — INSULIN GLARGINE 100 UNIT/ML SC SOLN
100 | Freq: Every day | SUBCUTANEOUS | Status: AC
Start: 2023-09-27 — End: ?
  Administered 2023-09-27 – 2023-10-10 (×3): 5 [IU] via SUBCUTANEOUS

## 2023-09-27 MED FILL — INSULIN LISPRO 100 UNIT/ML IJ SOLN: 100 UNIT/ML | INTRAMUSCULAR | Qty: 2

## 2023-09-27 MED FILL — LINZESS 145 MCG PO CAPS: 145 MCG | ORAL | Qty: 2

## 2023-09-27 MED FILL — PROCHLORPERAZINE EDISYLATE 10 MG/2ML IJ SOLN: 10 MG/2ML | INTRAMUSCULAR | Qty: 2

## 2023-09-27 MED FILL — LANTUS 100 UNIT/ML SC SOLN: 100 UNIT/ML | SUBCUTANEOUS | Qty: 5

## 2023-09-27 MED FILL — BUSPIRONE HCL 15 MG PO TABS: 15 MG | ORAL | Qty: 1

## 2023-09-27 MED FILL — HYDROMORPHONE HCL 1 MG/ML IJ SOLN: 1 MG/ML | INTRAMUSCULAR | Qty: 1

## 2023-09-27 MED FILL — BUSPIRONE HCL 10 MG PO TABS: 10 MG | ORAL | Qty: 1

## 2023-09-27 MED FILL — SODIUM CHLORIDE 0.9 % IV SOLN: 0.9 % | INTRAVENOUS | Qty: 1000

## 2023-09-27 MED FILL — HYDROMORPHONE HCL 2 MG PO TABS: 2 MG | ORAL | Qty: 2

## 2023-09-27 MED FILL — PANTOPRAZOLE SODIUM 40 MG IV SOLR: 40 MG | INTRAVENOUS | Qty: 40

## 2023-09-27 MED FILL — METOPROLOL TARTRATE 50 MG PO TABS: 50 MG | ORAL | Qty: 1

## 2023-09-27 MED FILL — LABETALOL HCL 5 MG/ML IV SOLN: 5 MG/ML | INTRAVENOUS | Qty: 4

## 2023-09-27 MED FILL — AMLODIPINE BESYLATE 5 MG PO TABS: 5 MG | ORAL | Qty: 2

## 2023-09-27 MED FILL — METOCLOPRAMIDE HCL 5 MG/ML IJ SOLN: 5 MG/ML | INTRAMUSCULAR | Qty: 2

## 2023-09-27 MED FILL — HYDRALAZINE HCL 20 MG/ML IJ SOLN: 20 MG/ML | INTRAMUSCULAR | Qty: 1

## 2023-09-27 MED FILL — DULOXETINE HCL 30 MG PO CPEP: 30 MG | ORAL | Qty: 1

## 2023-09-27 MED FILL — ENOXAPARIN SODIUM 40 MG/0.4ML IJ SOSY: 40 MG/0.4ML | INTRAMUSCULAR | Qty: 0.4

## 2023-09-27 MED FILL — SERTRALINE HCL 50 MG PO TABS: 50 MG | ORAL | Qty: 1

## 2023-09-27 NOTE — Progress Notes (Signed)
 [place] [facility]  [address] [place], [place] [address]  [phone]        Hospitalist Progress Note      NAME: Melinda [name]   DOB:  July 09, 1981  MRM:  [id number]    Date/Time: 09/27/2023  5:04 PM         Subjective:

## 2023-09-27 NOTE — Plan of Care (Signed)
 Problem: Chronic Conditions and Co-morbidities  Goal: Patient's chronic conditions and co-morbidity symptoms are monitored and maintained or improved  Outcome: Progressing     Problem: Pain  Goal: Verbalizes/displays adequate comfort level or baseline co

## 2023-09-27 NOTE — Progress Notes (Signed)
 Spiritual Health History and Assessment/Progress Note  [facility]    Rituals, Rites and Sacraments,  ,  ,      Name: Winna Golla MRN: [id number]    Age: 42 y.o.     Sex: female   Language: Spanish   Religion: Catholic   Intractabl

## 2023-09-28 LAB — BASIC METABOLIC PANEL
Anion Gap: 6 mmol/L (ref 2–12)
BUN/Creatinine Ratio: 10 — ABNORMAL LOW (ref 12–20)
BUN: 17 mg/dL (ref 6–20)
CO2: 23 mmol/L (ref 21–32)
Calcium: 8.4 mg/dL — ABNORMAL LOW (ref 8.5–10.1)
Chloride: 109 mmol/L — ABNORMAL HIGH (ref 97–108)
Creatinine: 1.78 mg/dL — ABNORMAL HIGH (ref 0.55–1.02)
Est, Glom Filt Rate: 36 mL/min/{1.73_m2} — ABNORMAL LOW (ref >60)
Glucose: 118 mg/dL — ABNORMAL HIGH (ref 65–100)
Potassium: 3.6 mmol/L (ref 3.5–5.1)
Sodium: 138 mmol/L (ref 136–145)

## 2023-09-28 LAB — CBC WITH AUTO DIFFERENTIAL
Basophils %: 1 % (ref 0–1)
Basophils Absolute: 0.1 10*3/uL (ref 0.0–0.1)
Eosinophils %: 1 % (ref 0–7)
Eosinophils Absolute: 0.1 10*3/uL (ref 0.0–0.4)
Hematocrit: 28.9 % — ABNORMAL LOW (ref 35.0–47.0)
Hemoglobin: 9.3 g/dL — ABNORMAL LOW (ref 11.5–16.0)
Immature Granulocytes %: 0 % (ref 0.0–0.5)
Immature Granulocytes Absolute: 0 10*3/uL (ref 0.00–0.04)
Lymphocytes %: 21 % (ref 12–49)
Lymphocytes Absolute: 1.5 10*3/uL (ref 0.8–3.5)
MCH: 28.8 pg (ref 26.0–34.0)
MCHC: 32.2 g/dL (ref 30.0–36.5)
MCV: 89.5 fL (ref 80.0–99.0)
MPV: 8.4 fL — ABNORMAL LOW (ref 8.9–12.9)
Monocytes %: 7 % (ref 5–13)
Monocytes Absolute: 0.5 10*3/uL (ref 0.0–1.0)
Neutrophils %: 70 % (ref 32–75)
Neutrophils Absolute: 5 10*3/uL (ref 1.8–8.0)
Nucleated RBCs: 0 /100{WBCs}
Platelets: 413 10*3/uL — ABNORMAL HIGH (ref 150–400)
RBC: 3.23 M/uL — ABNORMAL LOW (ref 3.80–5.20)
RDW: 13.8 % (ref 11.5–14.5)
WBC: 7.1 10*3/uL (ref 3.6–11.0)
nRBC: 0 10*3/uL (ref 0.00–0.01)

## 2023-09-28 LAB — POCT GLUCOSE
POC Glucose: 132 mg/dL — ABNORMAL HIGH (ref 65–117)
POC Glucose: 121 mg/dL — ABNORMAL HIGH (ref 65–117)
POC Glucose: 103 mg/dL (ref 65–117)
POC Glucose: 79 mg/dL (ref 65–117)
POC Glucose: 129 mg/dL — ABNORMAL HIGH (ref 65–117)

## 2023-09-28 LAB — MAGNESIUM: Magnesium: 1.8 mg/dL (ref 1.6–2.4)

## 2023-09-28 MED ORDER — HYDROMORPHONE HCL 2 MG PO TABS
2 MG | ORAL | Status: DC | PRN
Start: 2023-09-28 — End: 2023-10-06
  Administered 2023-09-29 – 2023-10-05 (×10): 4 mg via ORAL

## 2023-09-28 MED FILL — PANTOPRAZOLE SODIUM 40 MG IV SOLR: 40 MG | INTRAVENOUS | Qty: 40

## 2023-09-28 MED FILL — LINZESS 145 MCG PO CAPS: 145 MCG | ORAL | Qty: 2

## 2023-09-28 MED FILL — BUSPIRONE HCL 15 MG PO TABS: 15 MG | ORAL | Qty: 1

## 2023-09-28 MED FILL — DULOXETINE HCL 30 MG PO CPEP: 30 MG | ORAL | Qty: 1

## 2023-09-28 MED FILL — HYDROMORPHONE HCL 1 MG/ML IJ SOLN: 1 MG/ML | INTRAMUSCULAR | Qty: 1

## 2023-09-28 MED FILL — ENOXAPARIN SODIUM 40 MG/0.4ML IJ SOSY: 40 MG/0.4ML | INTRAMUSCULAR | Qty: 0.4

## 2023-09-28 MED FILL — METOPROLOL TARTRATE 50 MG PO TABS: 50 MG | ORAL | Qty: 1

## 2023-09-28 MED FILL — LANTUS 100 UNIT/ML SC SOLN: 100 UNIT/ML | SUBCUTANEOUS | Qty: 5

## 2023-09-28 MED FILL — AMLODIPINE BESYLATE 5 MG PO TABS: 5 MG | ORAL | Qty: 2

## 2023-09-28 MED FILL — HYDROMORPHONE HCL 2 MG PO TABS: 2 MG | ORAL | Qty: 2

## 2023-09-28 NOTE — Progress Notes (Signed)
 [place] [facility]  [address] [place] [address], [place] [address]  [phone]        Hospitalist Progress Note      NAME: Melinda [name]   DOB:  13-Oct-1980  MRM:  [id number]    Date/Time: 09/28/2023  2:41 PM         Subjective:

## 2023-09-28 NOTE — Progress Notes (Signed)
 Spiritual Health History and Assessment/Progress Note  [facility]    Initial Encounter,  ,  ,      Name: Melinda [name] MRN: [id number]    Age: 42 y.o.     Sex: female   Language: Spanish   Religion: Catholic   Intractable nausea and

## 2023-09-28 NOTE — Plan of Care (Signed)
 Problem: Chronic Conditions and Co-morbidities  Goal: Patient's chronic conditions and co-morbidity symptoms are monitored and maintained or improved  Outcome: Progressing     Problem: Pain  Goal: Verbalizes/displays adequate comfort level or baseline co

## 2023-09-28 NOTE — Progress Notes (Signed)
 Spoke with radiology. Unable to clearly visualize mesenteric vasculature to definitely say no mesenteric stenosis. If renal function improves consider CTA if not can consider MRA with contrast.

## 2023-09-28 NOTE — Progress Notes (Signed)
 2309H: Patients IV line has inflitrated. RRT nurse has done 3 times with ultrasound but was unsuccessful. Relayed to NP Marca Ancona.

## 2023-09-28 NOTE — Care Coordination-Inpatient (Signed)
 Care Management Initial Assessment  09/28/2023 10:10 AM  If patient is discharged prior to next notation, then this note serves as note for discharge by case management.    Reason for Admission:   Epigastric pain [R10.13]  Acute renal insufficiency [N2

## 2023-09-28 NOTE — Consults (Signed)
Session ID: [id number]  Language: Spanish  Interpreter ID: #[id number]  Interpreter Name: Malachi Bonds

## 2023-09-29 ENCOUNTER — Inpatient Hospital Stay: Admit: 2023-09-29 | Payer: MEDICAID | Primary: Adult Health

## 2023-09-29 LAB — CBC WITH AUTO DIFFERENTIAL
Basophils %: 1 % (ref 0–1)
Basophils Absolute: 0.1 10*3/uL (ref 0.0–0.1)
Eosinophils %: 5 % (ref 0–7)
Eosinophils Absolute: 0.3 10*3/uL (ref 0.0–0.4)
Hematocrit: 28.8 % — ABNORMAL LOW (ref 35.0–47.0)
Hemoglobin: 9.7 g/dL — ABNORMAL LOW (ref 11.5–16.0)
Immature Granulocytes %: 0 % (ref 0.0–0.5)
Immature Granulocytes Absolute: 0 10*3/uL (ref 0.00–0.04)
Lymphocytes %: 23 % (ref 12–49)
Lymphocytes Absolute: 1.4 10*3/uL (ref 0.8–3.5)
MCH: 28.9 pg (ref 26.0–34.0)
MCHC: 33.7 g/dL (ref 30.0–36.5)
MCV: 85.7 fL (ref 80.0–99.0)
MPV: 8.8 fL — ABNORMAL LOW (ref 8.9–12.9)
Monocytes %: 8 % (ref 5–13)
Monocytes Absolute: 0.5 10*3/uL (ref 0.0–1.0)
Neutrophils %: 63 % (ref 32–75)
Neutrophils Absolute: 3.9 10*3/uL (ref 1.8–8.0)
Nucleated RBCs: 0 /100{WBCs}
Platelets: 499 10*3/uL — ABNORMAL HIGH (ref 150–400)
RBC: 3.36 M/uL — ABNORMAL LOW (ref 3.80–5.20)
RDW: 13.4 % (ref 11.5–14.5)
WBC: 6.2 10*3/uL (ref 3.6–11.0)
nRBC: 0 10*3/uL (ref 0.00–0.01)

## 2023-09-29 LAB — BASIC METABOLIC PANEL
Anion Gap: 7 mmol/L (ref 2–12)
BUN/Creatinine Ratio: 10 — ABNORMAL LOW (ref 12–20)
BUN: 15 mg/dL (ref 6–20)
CO2: 23 mmol/L (ref 21–32)
Calcium: 8.2 mg/dL — ABNORMAL LOW (ref 8.5–10.1)
Chloride: 107 mmol/L (ref 97–108)
Creatinine: 1.57 mg/dL — ABNORMAL HIGH (ref 0.55–1.02)
Est, Glom Filt Rate: 42 mL/min/{1.73_m2} — ABNORMAL LOW (ref >60)
Glucose: 86 mg/dL (ref 65–100)
Potassium: 3.2 mmol/L — ABNORMAL LOW (ref 3.5–5.1)
Sodium: 137 mmol/L (ref 136–145)

## 2023-09-29 LAB — LIPASE: Lipase: 13 U/L (ref 13–75)

## 2023-09-29 LAB — POCT GLUCOSE
POC Glucose: 173 mg/dL — ABNORMAL HIGH (ref 65–117)
POC Glucose: 80 mg/dL (ref 65–117)
POC Glucose: 182 mg/dL — ABNORMAL HIGH (ref 65–117)
POC Glucose: 188 mg/dL — ABNORMAL HIGH (ref 65–117)
POC Glucose: 150 mg/dL — ABNORMAL HIGH (ref 65–117)

## 2023-09-29 MED ORDER — INSULIN LISPRO 100 UNIT/ML IJ SOLN
100 UNIT/ML | Freq: Four times a day (QID) | INTRAMUSCULAR | Status: DC
Start: 2023-09-29 — End: 2023-10-10
  Administered 2023-10-02 – 2023-10-10 (×6): 2 [IU] via SUBCUTANEOUS

## 2023-09-29 MED ORDER — DICYCLOMINE HCL 20 MG PO TABS
20 | Freq: Four times a day (QID) | ORAL | Status: DC
Start: 2023-09-29 — End: 2023-09-29

## 2023-09-29 MED ORDER — HYDROMORPHONE HCL PF 1 MG/ML IJ SOLN
1 MG/ML | INTRAMUSCULAR | Status: DC | PRN
Start: 2023-09-29 — End: 2023-10-05
  Administered 2023-09-30 – 2023-10-05 (×23): 1 mg via INTRAVENOUS

## 2023-09-29 MED ORDER — LACTATED RINGERS IV SOLN
INTRAVENOUS | Status: DC
Start: 2023-09-29 — End: 2023-10-03
  Administered 2023-09-29 – 2023-10-03 (×8): via INTRAVENOUS

## 2023-09-29 MED ORDER — HYDROMORPHONE HCL PF 1 MG/ML IJ SOLN
1 | INTRAMUSCULAR | Status: DC | PRN
Start: 2023-09-29 — End: 2023-09-29
  Administered 2023-09-29: 21:00:00 1 mg via INTRAVENOUS

## 2023-09-29 MED ORDER — ASPIRIN 81 MG PO CHEW
81 MG | Freq: Every day | ORAL | Status: DC
Start: 2023-09-29 — End: 2023-10-14
  Administered 2023-09-29 – 2023-10-14 (×11): 81 mg via ORAL

## 2023-09-29 MED ORDER — POTASSIUM BICARB-CITRIC ACID 10 MEQ PO TBEF
10 | Freq: Once | ORAL | Status: AC
Start: 2023-09-29 — End: 2023-09-29
  Administered 2023-09-29: 12:00:00 40 meq via ORAL

## 2023-09-29 MED ORDER — POTASSIUM BICARB-CITRIC ACID 10 MEQ PO TBEF
10 | Freq: Once | ORAL | Status: AC
Start: 2023-09-29 — End: 2023-09-29
  Administered 2023-09-29: 15:00:00 40 meq via ORAL

## 2023-09-29 MED ORDER — HYDROMORPHONE HCL PF 1 MG/ML IJ SOLN
1 | Freq: Once | INTRAMUSCULAR | Status: AC
Start: 2023-09-29 — End: 2023-09-29
  Administered 2023-09-29: 23:00:00 1 mg via INTRAVENOUS

## 2023-09-29 MED ORDER — IOPAMIDOL 76 % IV SOLN
76 | Freq: Once | INTRAVENOUS | Status: AC | PRN
Start: 2023-09-29 — End: 2023-09-29
  Administered 2023-09-29: 19:00:00 100 mL via INTRAVENOUS

## 2023-09-29 MED ORDER — DULOXETINE HCL 30 MG PO CPEP
30 | Freq: Once | ORAL | Status: AC
Start: 2023-09-29 — End: 2023-09-29
  Administered 2023-09-29: 22:00:00 30 mg via ORAL

## 2023-09-29 MED ORDER — DULOXETINE HCL 30 MG PO CPEP
30 MG | Freq: Every day | ORAL | Status: DC
Start: 2023-09-29 — End: 2023-10-14
  Administered 2023-09-30 – 2023-10-14 (×11): 60 mg via ORAL

## 2023-09-29 MED FILL — PANTOPRAZOLE SODIUM 40 MG IV SOLR: 40 MG | INTRAVENOUS | Qty: 40

## 2023-09-29 MED FILL — HYDROMORPHONE HCL 2 MG PO TABS: 2 MG | ORAL | Qty: 2

## 2023-09-29 MED FILL — METOPROLOL TARTRATE 50 MG PO TABS: 50 MG | ORAL | Qty: 1

## 2023-09-29 MED FILL — EFFER-K 10 MEQ PO TBEF: 10 MEQ | ORAL | Qty: 2

## 2023-09-29 MED FILL — BUSPIRONE HCL 15 MG PO TABS: 15 MG | ORAL | Qty: 1

## 2023-09-29 MED FILL — ISOVUE-370 76 % IV SOLN: 76 % | INTRAVENOUS | Qty: 100

## 2023-09-29 MED FILL — HYDROMORPHONE HCL 1 MG/ML IJ SOLN: 1 MG/ML | INTRAMUSCULAR | Qty: 1

## 2023-09-29 MED FILL — DULOXETINE HCL 30 MG PO CPEP: 30 MG | ORAL | Qty: 1

## 2023-09-29 MED FILL — INSULIN LISPRO 100 UNIT/ML IJ SOLN: 100 UNIT/ML | INTRAMUSCULAR | Qty: 2

## 2023-09-29 MED FILL — PROCHLORPERAZINE EDISYLATE 10 MG/2ML IJ SOLN: 10 MG/2ML | INTRAMUSCULAR | Qty: 2

## 2023-09-29 MED FILL — ASPIRIN LOW DOSE 81 MG PO CHEW: 81 MG | ORAL | Qty: 1

## 2023-09-29 MED FILL — ENOXAPARIN SODIUM 40 MG/0.4ML IJ SOSY: 40 MG/0.4ML | INTRAMUSCULAR | Qty: 0.4

## 2023-09-29 MED FILL — AMLODIPINE BESYLATE 5 MG PO TABS: 5 MG | ORAL | Qty: 2

## 2023-09-29 MED FILL — EFFER-K 10 MEQ PO TBEF: 10 MEQ | ORAL | Qty: 4

## 2023-09-29 MED FILL — LACTATED RINGERS IV SOLN: INTRAVENOUS | Qty: 1000

## 2023-09-29 MED FILL — LINZESS 145 MCG PO CAPS: 145 MCG | ORAL | Qty: 2

## 2023-09-29 NOTE — Plan of Care (Signed)
 Problem: Chronic Conditions and Co-morbidities  Goal: Patient's chronic conditions and co-morbidity symptoms are monitored and maintained or improved  Outcome: Progressing     Problem: Pain  Goal: Verbalizes/displays adequate comfort level or baseline co

## 2023-09-29 NOTE — Plan of Care (Signed)
 Problem: Chronic Conditions and Co-morbidities  Goal: Patient's chronic conditions and co-morbidity symptoms are monitored and maintained or improved  Outcome: Progressing  Flowsheets (Taken 09/28/2023 2032)  Care Plan - Patient's Chronic Conditions and

## 2023-09-29 NOTE — Progress Notes (Signed)
 Ultrasound IV by Jacqualine Code RN  :  Procedure Note    Ultrasound IV education provided to patient. Opportunities for questions given.     Ultrasound used for PIV placement:  20  gauge 8 cm PowerGlide Pro 8cm  left brachial location.  1 X Attempt(s).    Vigorous

## 2023-09-29 NOTE — Consults (Signed)
 Session ID: [id number]  Language: Spanish  Interpreter ID: #[id number]  Interpreter Name: Derek Mound

## 2023-09-29 NOTE — Progress Notes (Signed)
 [place] [facility]  [address] [address] [place], [place] [address]  [phone]        Hospitalist Progress Note      NAME: Melinda [name]   DOB:  09/26/1981  MRM:  [id number]    Date/Time: 09/29/2023  3:32 PM         Subjective:

## 2023-09-30 LAB — CBC WITH AUTO DIFFERENTIAL
Basophils %: 1 % (ref 0–1)
Basophils Absolute: 0.1 10*3/uL (ref 0.0–0.1)
Eosinophils %: 5 % (ref 0–7)
Eosinophils Absolute: 0.3 10*3/uL (ref 0.0–0.4)
Hematocrit: 27.9 % — ABNORMAL LOW (ref 35.0–47.0)
Hemoglobin: 9.3 g/dL — ABNORMAL LOW (ref 11.5–16.0)
Immature Granulocytes %: 0 % (ref 0.0–0.5)
Immature Granulocytes Absolute: 0 10*3/uL (ref 0.00–0.04)
Lymphocytes %: 40 % (ref 12–49)
Lymphocytes Absolute: 2.3 10*3/uL (ref 0.8–3.5)
MCH: 28.9 pg (ref 26.0–34.0)
MCHC: 33.3 g/dL (ref 30.0–36.5)
MCV: 86.6 fL (ref 80.0–99.0)
MPV: 8.8 fL — ABNORMAL LOW (ref 8.9–12.9)
Monocytes %: 10 % (ref 5–13)
Monocytes Absolute: 0.5 10*3/uL (ref 0.0–1.0)
Neutrophils %: 44 % (ref 32–75)
Neutrophils Absolute: 2.4 10*3/uL (ref 1.8–8.0)
Nucleated RBCs: 0 /100{WBCs}
Platelets: 471 10*3/uL — ABNORMAL HIGH (ref 150–400)
RBC: 3.22 M/uL — ABNORMAL LOW (ref 3.80–5.20)
RDW: 13.7 % (ref 11.5–14.5)
WBC: 5.6 10*3/uL (ref 3.6–11.0)
nRBC: 0 10*3/uL (ref 0.00–0.01)

## 2023-09-30 LAB — BASIC METABOLIC PANEL
Anion Gap: 3 mmol/L (ref 2–12)
BUN/Creatinine Ratio: 9 — ABNORMAL LOW (ref 12–20)
BUN: 14 mg/dL (ref 6–20)
CO2: 26 mmol/L (ref 21–32)
Calcium: 8 mg/dL — ABNORMAL LOW (ref 8.5–10.1)
Chloride: 106 mmol/L (ref 97–108)
Creatinine: 1.61 mg/dL — ABNORMAL HIGH (ref 0.55–1.02)
Est, Glom Filt Rate: 41 mL/min/{1.73_m2} — ABNORMAL LOW (ref >60)
Glucose: 127 mg/dL — ABNORMAL HIGH (ref 65–100)
Potassium: 3.8 mmol/L (ref 3.5–5.1)
Sodium: 135 mmol/L — ABNORMAL LOW (ref 136–145)

## 2023-09-30 LAB — POCT GLUCOSE
POC Glucose: 150 mg/dL — ABNORMAL HIGH (ref 65–117)
POC Glucose: 116 mg/dL (ref 65–117)
POC Glucose: 120 mg/dL — ABNORMAL HIGH (ref 65–117)
POC Glucose: 152 mg/dL — ABNORMAL HIGH (ref 65–117)
POC Glucose: 137 mg/dL — ABNORMAL HIGH (ref 65–117)
POC Glucose: 138 mg/dL — ABNORMAL HIGH (ref 65–117)

## 2023-09-30 MED ORDER — LORAZEPAM 2 MG/ML IJ SOLN
2 MG/ML | Freq: Four times a day (QID) | INTRAMUSCULAR | Status: DC | PRN
Start: 2023-09-30 — End: 2023-10-11
  Administered 2023-10-07 – 2023-10-11 (×2): 0.26 mg via INTRAVENOUS

## 2023-09-30 MED FILL — PANTOPRAZOLE SODIUM 40 MG IV SOLR: 40 MG | INTRAVENOUS | Qty: 40

## 2023-09-30 MED FILL — HYDRALAZINE HCL 20 MG/ML IJ SOLN: 20 MG/ML | INTRAMUSCULAR | Qty: 1

## 2023-09-30 MED FILL — HYDROMORPHONE HCL 1 MG/ML IJ SOLN: 1 MG/ML | INTRAMUSCULAR | Qty: 1

## 2023-09-30 MED FILL — PROCHLORPERAZINE EDISYLATE 10 MG/2ML IJ SOLN: 10 MG/2ML | INTRAMUSCULAR | Qty: 2

## 2023-09-30 MED FILL — METOPROLOL TARTRATE 50 MG PO TABS: 50 MG | ORAL | Qty: 1

## 2023-09-30 MED FILL — BUSPIRONE HCL 15 MG PO TABS: 15 MG | ORAL | Qty: 1

## 2023-09-30 MED FILL — ASPIRIN LOW DOSE 81 MG PO CHEW: 81 MG | ORAL | Qty: 1

## 2023-09-30 MED FILL — AMLODIPINE BESYLATE 5 MG PO TABS: 5 MG | ORAL | Qty: 2

## 2023-09-30 MED FILL — DULOXETINE HCL 30 MG PO CPEP: 30 MG | ORAL | Qty: 2

## 2023-09-30 MED FILL — ACETAMINOPHEN 325 MG PO TABS: 325 MG | ORAL | Qty: 2

## 2023-09-30 MED FILL — HYDROMORPHONE HCL 2 MG PO TABS: 2 MG | ORAL | Qty: 2

## 2023-09-30 MED FILL — LINZESS 145 MCG PO CAPS: 145 MCG | ORAL | Qty: 2

## 2023-09-30 NOTE — Consults (Signed)
 Vascular Surgery Consult Note  09/30/2023    Subjective:     Melinda [name] is a 42 y.o. female who was admitted with abdominal pain. Vascular consulted for findings of SMA stenosis.  Patient seen and examined. Used interpreter to aid in translatio

## 2023-09-30 NOTE — Progress Notes (Signed)
 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  NP to s

## 2023-09-30 NOTE — Plan of Care (Signed)
 Problem: Chronic Conditions and Co-morbidities  Goal: Patient's chronic conditions and co-morbidity symptoms are monitored and maintained or improved  09/30/2023 0130 by Hetty Blend, RN  Outcome: Progressing  09/29/2023 2041 by Dorma Russell, RN  Katrine Coho

## 2023-09-30 NOTE — Plan of Care (Signed)
 Problem: Chronic Conditions and Co-morbidities  Goal: Patient's chronic conditions and co-morbidity symptoms are monitored and maintained or improved  09/30/2023 2201 by Ameri Cahoon, Jaynee Eagles, RN  Outcome: Progressing  09/30/2023 1146 by Wayland Salinas, RN  O

## 2023-09-30 NOTE — Plan of Care (Signed)
 Problem: Chronic Conditions and Co-morbidities  Goal: Patient's chronic conditions and co-morbidity symptoms are monitored and maintained or improved  10/02/2023 0241 by Kaelyn Innocent, Jaynee Eagles, RN  Outcome: Progressing  10/01/2023 1506 by Wayland Salinas, RN  O

## 2023-09-30 NOTE — Progress Notes (Signed)
 0015: noted vital signs of the patient bp of 170/107 mmHg with RR ranging from 9 to 10 with SPO2 of 94% at room air, pain level of 10. Findings relayed to CN Teresita, hydralazine 20 mgs iv given as prn dose with dilauded of 1 mg iv.    0059: Vital signs r

## 2023-09-30 NOTE — Progress Notes (Signed)
 Devonia Farro, PA-C                       [phone] office             Monday-Friday 8:00 am-4:30 pm  I am not permitted to use "perfect serve" use above

## 2023-09-30 NOTE — Consults (Signed)
 Session ID: [id number]  Language: Spanish  Interpreter ID: #[id number]  Interpreter Name: Doreene Burke

## 2023-09-30 NOTE — Other (Signed)
 Patient is oriented but extremely fatigued complains of nausea and vomited while me and the doctor were rounding. Went ahead and gave Pain medications and nausea medication. Patient did dip down to the lower 86% on room air and placed her on 2L NC while sh

## 2023-09-30 NOTE — Progress Notes (Signed)
 VAT Note:  Notified on perfect serve and order in EPIC for for new line. Ext dwell in place in left upper arm, placed yesterday with positive blood return, flushed easily. RN notified.

## 2023-09-30 NOTE — Progress Notes (Signed)
 Rapid Called at 0421    Responded to RRT at 0422 for Respiratory Distress    Provider at bedside: NO  Interventions ordered: Other (Comment) blood glucose, vitals  Sepsis Suspected: No  Transfer to Higher Level of Care: no  Blood Glucose: 120     Floor cal

## 2023-09-30 NOTE — Consults (Signed)
 Session ID: [id number]  Language: Spanish  Interpreter ID: #[id number]  Interpreter Name: Harriett Sine

## 2023-09-30 NOTE — Progress Notes (Signed)
 0421: Rapid response called by CN Teresita Tiron as the patient is bradypneic with RR ranging from 9-11 bpm. Vital signs and blood sugar level taken and recorded in flowsheet. Patient is drowsy but is oriented. Patient continued to be monitored. No further

## 2023-09-30 NOTE — Consults (Signed)
 Session ID: [id number]  Language: Spanish  Interpreter ID: #[id number]  Interpreter Name: Huntley Dec

## 2023-10-01 ENCOUNTER — Encounter: Payer: MEDICAID | Primary: Adult Health

## 2023-10-01 LAB — HEPATIC FUNCTION PANEL
ALT: 31 U/L (ref 12–78)
AST: 34 U/L (ref 15–37)
Albumin/Globulin Ratio: 0.5 — ABNORMAL LOW (ref 1.1–2.2)
Albumin: 1.7 g/dL — ABNORMAL LOW (ref 3.5–5.0)
Alk Phosphatase: 100 U/L (ref 45–117)
Bilirubin, Direct: 0.1 mg/dL (ref 0.0–0.2)
Globulin: 3.1 g/dL (ref 2.0–4.0)
Total Bilirubin: 0.3 mg/dL (ref 0.2–1.0)
Total Protein: 4.8 g/dL — ABNORMAL LOW (ref 6.4–8.2)

## 2023-10-01 LAB — CBC WITH AUTO DIFFERENTIAL
Basophils %: 1 % (ref 0–1)
Basophils Absolute: 0.1 10*3/uL (ref 0.0–0.1)
Eosinophils %: 6 % (ref 0–7)
Eosinophils Absolute: 0.3 10*3/uL (ref 0.0–0.4)
Hematocrit: 27.2 % — ABNORMAL LOW (ref 35.0–47.0)
Hemoglobin: 8.9 g/dL — ABNORMAL LOW (ref 11.5–16.0)
Immature Granulocytes %: 0 % (ref 0.0–0.5)
Immature Granulocytes Absolute: 0 10*3/uL (ref 0.00–0.04)
Lymphocytes %: 29 % (ref 12–49)
Lymphocytes Absolute: 1.8 10*3/uL (ref 0.8–3.5)
MCH: 28.5 pg (ref 26.0–34.0)
MCHC: 32.7 g/dL (ref 30.0–36.5)
MCV: 87.2 fL (ref 80.0–99.0)
MPV: 8.4 fL — ABNORMAL LOW (ref 8.9–12.9)
Monocytes %: 11 % (ref 5–13)
Monocytes Absolute: 0.6 10*3/uL (ref 0.0–1.0)
Neutrophils %: 53 % (ref 32–75)
Neutrophils Absolute: 3.2 10*3/uL (ref 1.8–8.0)
Nucleated RBCs: 0 /100{WBCs}
Platelets: 467 10*3/uL — ABNORMAL HIGH (ref 150–400)
RBC: 3.12 M/uL — ABNORMAL LOW (ref 3.80–5.20)
RDW: 13.9 % (ref 11.5–14.5)
WBC: 6 10*3/uL (ref 3.6–11.0)
nRBC: 0 10*3/uL (ref 0.00–0.01)

## 2023-10-01 LAB — BASIC METABOLIC PANEL
Anion Gap: 2 mmol/L (ref 2–12)
BUN/Creatinine Ratio: 8 — ABNORMAL LOW (ref 12–20)
BUN: 16 mg/dL (ref 6–20)
CO2: 26 mmol/L (ref 21–32)
Calcium: 8 mg/dL — ABNORMAL LOW (ref 8.5–10.1)
Chloride: 106 mmol/L (ref 97–108)
Creatinine: 1.94 mg/dL — ABNORMAL HIGH (ref 0.55–1.02)
Est, Glom Filt Rate: 33 mL/min/{1.73_m2} — ABNORMAL LOW (ref >60)
Glucose: 139 mg/dL — ABNORMAL HIGH (ref 65–100)
Potassium: 3.7 mmol/L (ref 3.5–5.1)
Sodium: 134 mmol/L — ABNORMAL LOW (ref 136–145)

## 2023-10-01 LAB — POCT GLUCOSE
POC Glucose: 156 mg/dL — ABNORMAL HIGH (ref 65–117)
POC Glucose: 145 mg/dL — ABNORMAL HIGH (ref 65–117)
POC Glucose: 157 mg/dL — ABNORMAL HIGH (ref 65–117)
POC Glucose: 167 mg/dL — ABNORMAL HIGH (ref 65–117)

## 2023-10-01 LAB — MAGNESIUM: Magnesium: 1.6 mg/dL (ref 1.6–2.4)

## 2023-10-01 LAB — C-REACTIVE PROTEIN: CRP: <0.29 mg/dL (ref 0.00–0.30)

## 2023-10-01 LAB — PANCREATIC ELASTASE, FECAL: PANCREATIC ELASTASE, FECAL: 191 ug Elast./g — ABNORMAL LOW (ref >200)

## 2023-10-01 LAB — LIPASE: Lipase: 11 U/L — ABNORMAL LOW (ref 13–75)

## 2023-10-01 LAB — PHOSPHORUS: Phosphorus: 4.2 mg/dL (ref 2.6–4.7)

## 2023-10-01 MED ORDER — ENOXAPARIN SODIUM 30 MG/0.3ML IJ SOSY
30 MG/0.3ML | Freq: Every day | INTRAMUSCULAR | Status: DC
Start: 2023-10-01 — End: 2023-10-09
  Administered 2023-10-01 – 2023-10-09 (×8): 30 mg via SUBCUTANEOUS

## 2023-10-01 MED ORDER — BUMETANIDE 0.25 MG/ML IJ SOLN
0.25 | Freq: Once | INTRAMUSCULAR | Status: AC
Start: 2023-10-01 — End: 2023-10-01
  Administered 2023-10-01: 19:00:00 1 mg via INTRAVENOUS

## 2023-10-01 MED ORDER — ALBUMIN HUMAN 25 % IV SOLN
25 | Freq: Three times a day (TID) | INTRAVENOUS | Status: AC
Start: 2023-10-01 — End: 2023-10-01
  Administered 2023-10-01 – 2023-10-02 (×2): 25 g via INTRAVENOUS

## 2023-10-01 MED FILL — PANTOPRAZOLE SODIUM 40 MG IV SOLR: 40 MG | INTRAVENOUS | Qty: 40

## 2023-10-01 MED FILL — HYDRALAZINE HCL 20 MG/ML IJ SOLN: 20 MG/ML | INTRAMUSCULAR | Qty: 1

## 2023-10-01 MED FILL — METOPROLOL TARTRATE 50 MG PO TABS: 50 MG | ORAL | Qty: 1

## 2023-10-01 MED FILL — HYDROMORPHONE HCL 1 MG/ML IJ SOLN: 1 MG/ML | INTRAMUSCULAR | Qty: 1

## 2023-10-01 MED FILL — ASPIRIN LOW DOSE 81 MG PO CHEW: 81 MG | ORAL | Qty: 1

## 2023-10-01 MED FILL — AMLODIPINE BESYLATE 5 MG PO TABS: 5 MG | ORAL | Qty: 2

## 2023-10-01 MED FILL — DULOXETINE HCL 30 MG PO CPEP: 30 MG | ORAL | Qty: 2

## 2023-10-01 MED FILL — BUSPIRONE HCL 15 MG PO TABS: 15 MG | ORAL | Qty: 1

## 2023-10-01 MED FILL — PROCHLORPERAZINE EDISYLATE 10 MG/2ML IJ SOLN: 10 MG/2ML | INTRAMUSCULAR | Qty: 2

## 2023-10-01 MED FILL — BUMETANIDE 0.25 MG/ML IJ SOLN: 0.25 MG/ML | INTRAMUSCULAR | Qty: 4

## 2023-10-01 MED FILL — LINZESS 145 MCG PO CAPS: 145 MCG | ORAL | Qty: 2

## 2023-10-01 MED FILL — LABETALOL HCL 5 MG/ML IV SOLN: 5 MG/ML | INTRAVENOUS | Qty: 4

## 2023-10-01 MED FILL — ALBUMIN HUMAN 25 % IV SOLN: 25 % | INTRAVENOUS | Qty: 100

## 2023-10-01 MED FILL — ENOXAPARIN SODIUM 30 MG/0.3ML IJ SOSY: 30 MG/0.3ML | INTRAMUSCULAR | Qty: 0.3

## 2023-10-01 NOTE — Progress Notes (Signed)
 [place] [facility]  [address] [address] [place], [place] [address]  [phone]        Hospitalist Progress Note      NAME: Deloma Spindle   DOB:  1981-08-24  MRM:  [id number]    Date/Time of service: 10/01/2023  12:29 PM       S

## 2023-10-01 NOTE — Progress Notes (Signed)
 [facility] Lovenox Dosing  Lovenox dose change per protocol  Physician:     [facility] Protocol  Enoxaparin prophylaxis dosing (medically ill, surgical patients)             Patient Weight (kg)      50 and below 51 - 100 101 - 150 151 - 174 175 or greater            E

## 2023-10-02 LAB — POCT GLUCOSE
POC Glucose: 187 mg/dL — ABNORMAL HIGH (ref 65–117)
POC Glucose: 133 mg/dL — ABNORMAL HIGH (ref 65–117)
POC Glucose: 138 mg/dL — ABNORMAL HIGH (ref 65–117)
POC Glucose: 155 mg/dL — ABNORMAL HIGH (ref 65–117)
POC Glucose: 139 mg/dL — ABNORMAL HIGH (ref 65–117)

## 2023-10-02 LAB — CBC
Hematocrit: 27.9 % — ABNORMAL LOW (ref 35.0–47.0)
Hemoglobin: 9.3 g/dL — ABNORMAL LOW (ref 11.5–16.0)
MCH: 28.4 pg (ref 26.0–34.0)
MCHC: 33.3 g/dL (ref 30.0–36.5)
MCV: 85.1 fL (ref 80.0–99.0)
MPV: 8.6 fL — ABNORMAL LOW (ref 8.9–12.9)
Nucleated RBCs: 0 /100{WBCs}
Platelets: 511 10*3/uL — ABNORMAL HIGH (ref 150–400)
RBC: 3.28 M/uL — ABNORMAL LOW (ref 3.80–5.20)
RDW: 13.6 % (ref 11.5–14.5)
WBC: 7.5 10*3/uL (ref 3.6–11.0)
nRBC: 0 10*3/uL (ref 0.00–0.01)

## 2023-10-02 LAB — BASIC METABOLIC PANEL
Anion Gap: 6 mmol/L (ref 2–12)
BUN/Creatinine Ratio: 7 — ABNORMAL LOW (ref 12–20)
BUN: 14 mg/dL (ref 6–20)
CO2: 27 mmol/L (ref 21–32)
Calcium: 8.6 mg/dL (ref 8.5–10.1)
Chloride: 103 mmol/L (ref 97–108)
Creatinine: 2.14 mg/dL — ABNORMAL HIGH (ref 0.55–1.02)
Est, Glom Filt Rate: 29 mL/min/{1.73_m2} — ABNORMAL LOW (ref >60)
Glucose: 123 mg/dL — ABNORMAL HIGH (ref 65–100)
Potassium: 3.2 mmol/L — ABNORMAL LOW (ref 3.5–5.1)
Sodium: 136 mmol/L (ref 136–145)

## 2023-10-02 LAB — MAGNESIUM: Magnesium: 1.5 mg/dL — ABNORMAL LOW (ref 1.6–2.4)

## 2023-10-02 LAB — IRON AND TIBC
Iron % Saturation: 22 % (ref 20–50)
Iron: 27 ug/dL — ABNORMAL LOW (ref 35–150)
TIBC: 124 ug/dL — ABNORMAL LOW (ref 250–450)

## 2023-10-02 LAB — PHOSPHORUS: Phosphorus: 4 mg/dL (ref 2.6–4.7)

## 2023-10-02 LAB — FERRITIN: Ferritin: 180 ng/mL (ref 26–388)

## 2023-10-02 LAB — VITAMIN B12: Vitamin B-12: >2000 pg/mL — ABNORMAL HIGH (ref 193–986)

## 2023-10-02 LAB — FOLATE: Folate: 10.9 ng/mL (ref 5.0–21.0)

## 2023-10-02 MED ORDER — MAGNESIUM SULFATE 2000 MG/50 ML IVPB PREMIX
2 | Freq: Once | INTRAVENOUS | Status: AC
Start: 2023-10-02 — End: 2023-10-02
  Administered 2023-10-02: 15:00:00 2000 mg via INTRAVENOUS

## 2023-10-02 MED ORDER — POTASSIUM BICARB-CITRIC ACID 10 MEQ PO TBEF
10 | Freq: Once | ORAL | Status: AC
Start: 2023-10-02 — End: 2023-10-02
  Administered 2023-10-02: 14:00:00 40 meq via ORAL

## 2023-10-02 MED ORDER — ONDANSETRON HCL 4 MG/2ML IJ SOLN
4 MG/2ML | Freq: Four times a day (QID) | INTRAMUSCULAR | Status: DC | PRN
Start: 2023-10-02 — End: 2023-10-06
  Administered 2023-10-03 – 2023-10-06 (×6): 4 mg via INTRAVENOUS

## 2023-10-02 MED ORDER — LABETALOL HCL 5 MG/ML IV SOLN
5 | INTRAVENOUS | Status: AC | PRN
Start: 2023-10-02 — End: ?
  Administered 2023-10-02 – 2023-10-14 (×7): 20 mg via INTRAVENOUS

## 2023-10-02 MED FILL — BUSPIRONE HCL 15 MG PO TABS: 15 MG | ORAL | Qty: 1

## 2023-10-02 MED FILL — PROCHLORPERAZINE EDISYLATE 10 MG/2ML IJ SOLN: 10 MG/2ML | INTRAMUSCULAR | Qty: 2

## 2023-10-02 MED FILL — ASPIRIN LOW DOSE 81 MG PO CHEW: 81 MG | ORAL | Qty: 1

## 2023-10-02 MED FILL — INSULIN LISPRO 100 UNIT/ML IJ SOLN: 100 UNIT/ML | INTRAMUSCULAR | Qty: 2

## 2023-10-02 MED FILL — AMLODIPINE BESYLATE 5 MG PO TABS: 5 MG | ORAL | Qty: 2

## 2023-10-02 MED FILL — METOPROLOL TARTRATE 50 MG PO TABS: 50 MG | ORAL | Qty: 1

## 2023-10-02 MED FILL — MAGNESIUM SULFATE 2 GM/50ML IV SOLN: 2 GM/50ML | INTRAVENOUS | Qty: 50

## 2023-10-02 MED FILL — EFFER-K 10 MEQ PO TBEF: 10 MEQ | ORAL | Qty: 4

## 2023-10-02 MED FILL — PANTOPRAZOLE SODIUM 40 MG IV SOLR: 40 MG | INTRAVENOUS | Qty: 40

## 2023-10-02 MED FILL — ENOXAPARIN SODIUM 30 MG/0.3ML IJ SOSY: 30 MG/0.3ML | INTRAMUSCULAR | Qty: 0.3

## 2023-10-02 MED FILL — LINZESS 145 MCG PO CAPS: 145 MCG | ORAL | Qty: 2

## 2023-10-02 MED FILL — HYDROMORPHONE HCL 1 MG/ML IJ SOLN: 1 MG/ML | INTRAMUSCULAR | Qty: 1

## 2023-10-02 MED FILL — LABETALOL HCL 5 MG/ML IV SOLN: 5 MG/ML | INTRAVENOUS | Qty: 4

## 2023-10-02 MED FILL — ALBUMIN HUMAN 25 % IV SOLN: 25 % | INTRAVENOUS | Qty: 100

## 2023-10-02 MED FILL — DULOXETINE HCL 30 MG PO CPEP: 30 MG | ORAL | Qty: 2

## 2023-10-02 NOTE — Plan of Care (Signed)
 Problem: Chronic Conditions and Co-morbidities  Goal: Patient's chronic conditions and co-morbidity symptoms are monitored and maintained or improved  Outcome: Progressing  Flowsheets (Taken 09/29/2023 2041 by Hetty Blend, RN)  Care Plan - Patient's C

## 2023-10-02 NOTE — Consults (Signed)
  [facility]  Date of Birth: 07-Jun-1981     Assessment & Plan:     AKI  - ? Labile cr since 11 2024  - ? CKD  - ? AKI" Due to contrast,? Pancreatitis  - non oliguric  - UA: Glycosuria an

## 2023-10-02 NOTE — Consults (Signed)
 Session ID: [id number]  Language: Spanish  Interpreter ID: #[id number]  Interpreter Name: Harriett Sine

## 2023-10-02 NOTE — Progress Notes (Signed)
 [place] [facility]  [address] [place] [address] [place], [place] [address]  [phone]        Hospitalist Progress Note      NAME: Kaelan Emami   DOB:  30-May-1981  MRM:  [id number]    Date/Time of service: 10/02/2023  12:03 PM       S

## 2023-10-02 NOTE — Consults (Signed)
 Session ID: [id number]  Language: Spanish  Interpreter ID: #[id number]  Interpreter Name: Jari Favre

## 2023-10-03 LAB — COMPREHENSIVE METABOLIC PANEL
ALT: 26 U/L (ref 12–78)
AST: 26 U/L (ref 15–37)
Albumin/Globulin Ratio: 0.9 — ABNORMAL LOW (ref 1.1–2.2)
Albumin: 2.4 g/dL — ABNORMAL LOW (ref 3.5–5.0)
Alk Phosphatase: 89 U/L (ref 45–117)
Anion Gap: 11 mmol/L (ref 2–12)
BUN/Creatinine Ratio: 6 — ABNORMAL LOW (ref 12–20)
BUN: 15 mg/dL (ref 6–20)
CO2: 21 mmol/L (ref 21–32)
Calcium: 8.7 mg/dL (ref 8.5–10.1)
Chloride: 105 mmol/L (ref 97–108)
Creatinine: 2.34 mg/dL — ABNORMAL HIGH (ref 0.55–1.02)
Est, Glom Filt Rate: 26 mL/min/{1.73_m2} — ABNORMAL LOW (ref >60)
Globulin: 2.8 g/dL (ref 2.0–4.0)
Glucose: 169 mg/dL — ABNORMAL HIGH (ref 65–100)
Potassium: 3.5 mmol/L (ref 3.5–5.1)
Sodium: 137 mmol/L (ref 136–145)
Total Bilirubin: 0.5 mg/dL (ref 0.2–1.0)
Total Protein: 5.2 g/dL — ABNORMAL LOW (ref 6.4–8.2)

## 2023-10-03 LAB — CBC
Hematocrit: 31.4 % — ABNORMAL LOW (ref 35.0–47.0)
Hemoglobin: 10.5 g/dL — ABNORMAL LOW (ref 11.5–16.0)
MCH: 28.5 pg (ref 26.0–34.0)
MCHC: 33.4 g/dL (ref 30.0–36.5)
MCV: 85.3 fL (ref 80.0–99.0)
MPV: 8.6 fL — ABNORMAL LOW (ref 8.9–12.9)
Nucleated RBCs: 0 /100{WBCs}
Platelets: 577 10*3/uL — ABNORMAL HIGH (ref 150–400)
RBC: 3.68 M/uL — ABNORMAL LOW (ref 3.80–5.20)
RDW: 13.8 % (ref 11.5–14.5)
WBC: 7.1 10*3/uL (ref 3.6–11.0)
nRBC: 0 10*3/uL (ref 0.00–0.01)

## 2023-10-03 LAB — POCT GLUCOSE
POC Glucose: 146 mg/dL — ABNORMAL HIGH (ref 65–117)
POC Glucose: 152 mg/dL — ABNORMAL HIGH (ref 65–117)
POC Glucose: 153 mg/dL — ABNORMAL HIGH (ref 65–117)
POC Glucose: 169 mg/dL — ABNORMAL HIGH (ref 65–117)
POC Glucose: 151 mg/dL — ABNORMAL HIGH (ref 65–117)

## 2023-10-03 LAB — MAGNESIUM: Magnesium: 1.5 mg/dL — ABNORMAL LOW (ref 1.6–2.4)

## 2023-10-03 LAB — LIPASE: Lipase: 13 U/L (ref 13–75)

## 2023-10-03 LAB — PHOSPHORUS: Phosphorus: 4.6 mg/dL (ref 2.6–4.7)

## 2023-10-03 LAB — PORPHOBILINOGEN, URINE: Porphobilinogen, Ur: 1.9 mg/L (ref 0.0–2.0)

## 2023-10-03 LAB — C-REACTIVE PROTEIN: CRP: <0.29 mg/dL (ref 0.00–0.30)

## 2023-10-03 LAB — BRAIN NATRIURETIC PEPTIDE: NT Pro-BNP: 3703 pg/mL — ABNORMAL HIGH (ref <125)

## 2023-10-03 MED ORDER — MIDAZOLAM HCL 2 MG/2ML IJ SOLN
2 | INTRAMUSCULAR | Status: AC
Start: 2023-10-03 — End: ?

## 2023-10-03 MED ORDER — MIDAZOLAM HCL 2 MG/2ML IJ SOLN
2 | INTRAMUSCULAR | Status: DC | PRN
Start: 2023-10-03 — End: 2023-10-03
  Administered 2023-10-03: 15:00:00 .5 via INTRAVENOUS
  Administered 2023-10-03: 15:00:00 1 via INTRAVENOUS

## 2023-10-03 MED ORDER — MAGNESIUM SULFATE 2000 MG/50 ML IVPB PREMIX
2 | INTRAVENOUS | Status: AC
Start: 2023-10-03 — End: ?

## 2023-10-03 MED ORDER — HEPARIN (PORCINE) IN NACL 1000-0.9 UT/500ML-% IV SOLN
1000-0.9 | INTRAVENOUS | Status: AC
Start: 2023-10-03 — End: ?

## 2023-10-03 MED ORDER — FENTANYL CITRATE (PF) 100 MCG/2ML IJ SOLN
100 | INTRAMUSCULAR | Status: AC
Start: 2023-10-03 — End: ?

## 2023-10-03 MED ORDER — ASPIRIN 81 MG PO CHEW
81 | ORAL | Status: DC | PRN
Start: 2023-10-03 — End: 2023-10-03
  Administered 2023-10-03: 15:00:00 81 via ORAL

## 2023-10-03 MED ORDER — LIDOCAINE HCL 1% INJ (MIXTURES ONLY)
1 | INTRAMUSCULAR | Status: AC
Start: 2023-10-03 — End: ?

## 2023-10-03 MED ORDER — CLOPIDOGREL BISULFATE 300 MG PO TABS
300 | ORAL | Status: AC
Start: 2023-10-03 — End: ?

## 2023-10-03 MED ORDER — MAGNESIUM SULFATE 2000 MG/50 ML IVPB PREMIX
2 | Freq: Once | INTRAVENOUS | Status: AC
Start: 2023-10-03 — End: 2023-10-03
  Administered 2023-10-03: 16:00:00 2000 mg via INTRAVENOUS

## 2023-10-03 MED ORDER — CLOPIDOGREL BISULFATE 300 MG PO TABS
300 | ORAL | Status: DC | PRN
Start: 2023-10-03 — End: 2023-10-03
  Administered 2023-10-03: 15:00:00 300 via ORAL

## 2023-10-03 MED ORDER — HEPARIN SODIUM (PORCINE) 1000 UNIT/ML IJ SOLN
1000 | INTRAMUSCULAR | Status: AC
Start: 2023-10-03 — End: ?

## 2023-10-03 MED ORDER — ASPIRIN 81 MG PO CHEW
81 | ORAL | Status: AC
Start: 2023-10-03 — End: ?

## 2023-10-03 MED ORDER — CLOPIDOGREL BISULFATE 75 MG PO TABS
75 MG | Freq: Every day | ORAL | Status: DC
Start: 2023-10-03 — End: 2023-10-14
  Administered 2023-10-04 – 2023-10-14 (×7): 75 mg via ORAL

## 2023-10-03 MED ORDER — HEPARIN SODIUM (PORCINE) 1000 UNIT/ML IJ SOLN
1000 | INTRAMUSCULAR | Status: DC | PRN
Start: 2023-10-03 — End: 2023-10-03
  Administered 2023-10-03: 15:00:00 7000 via INTRAVENOUS

## 2023-10-03 MED ORDER — IOPAMIDOL 61 % IV SOLN
61 | INTRAVENOUS | Status: AC
Start: 2023-10-03 — End: ?

## 2023-10-03 MED ORDER — LIDOCAINE HCL (PF) 1 % IJ SOLN
1 | INTRAMUSCULAR | Status: DC | PRN
Start: 2023-10-03 — End: 2023-10-03
  Administered 2023-10-03: 15:00:00 5 via INTRADERMAL

## 2023-10-03 MED ORDER — MIDAZOLAM HCL 2 MG/2ML IJ SOLN
2 | INTRAMUSCULAR | Status: DC | PRN
Start: 2023-10-03 — End: 2023-10-03
  Administered 2023-10-03: 15:00:00 .5 via INTRAVENOUS
  Administered 2023-10-03 (×2): 1 via INTRAVENOUS

## 2023-10-03 MED ORDER — HYDRALAZINE HCL 20 MG/ML IJ SOLN
20 | INTRAMUSCULAR | Status: DC | PRN
Start: 2023-10-03 — End: 2023-10-03
  Administered 2023-10-03: 15:00:00 10 via INTRAVENOUS

## 2023-10-03 MED ORDER — HYDRALAZINE HCL 20 MG/ML IJ SOLN
20 | INTRAMUSCULAR | Status: AC
Start: 2023-10-03 — End: ?

## 2023-10-03 MED ORDER — FENTANYL CITRATE (PF) 100 MCG/2ML IJ SOLN
100 | INTRAMUSCULAR | Status: DC | PRN
Start: 2023-10-03 — End: 2023-10-03
  Administered 2023-10-03: 15:00:00 50 via INTRAVENOUS
  Administered 2023-10-03 (×2): 25 via INTRAVENOUS

## 2023-10-03 MED FILL — ONDANSETRON HCL 4 MG/2ML IJ SOLN: 4 MG/2ML | INTRAMUSCULAR | Qty: 2

## 2023-10-03 MED FILL — ISOVUE-300 61 % IV SOLN: 61 % | INTRAVENOUS | Qty: 200

## 2023-10-03 MED FILL — PANTOPRAZOLE SODIUM 40 MG IV SOLR: 40 MG | INTRAVENOUS | Qty: 40

## 2023-10-03 MED FILL — DULOXETINE HCL 30 MG PO CPEP: 30 MG | ORAL | Qty: 2

## 2023-10-03 MED FILL — FENTANYL CITRATE (PF) 100 MCG/2ML IJ SOLN: 100 MCG/2ML | INTRAMUSCULAR | Qty: 2

## 2023-10-03 MED FILL — BUSPIRONE HCL 15 MG PO TABS: 15 MG | ORAL | Qty: 1

## 2023-10-03 MED FILL — MIDAZOLAM HCL 2 MG/2ML IJ SOLN: 2 MG/ML | INTRAMUSCULAR | Qty: 2

## 2023-10-03 MED FILL — METOPROLOL TARTRATE 50 MG PO TABS: 50 MG | ORAL | Qty: 1

## 2023-10-03 MED FILL — DICYCLOMINE HCL 10 MG/ML IM SOLN: 10 MG/ML | INTRAMUSCULAR | Qty: 2

## 2023-10-03 MED FILL — CLOPIDOGREL BISULFATE 300 MG PO TABS: 300 MG | ORAL | Qty: 1

## 2023-10-03 MED FILL — MAGNESIUM SULFATE 2 GM/50ML IV SOLN: 2 GM/50ML | INTRAVENOUS | Qty: 50

## 2023-10-03 MED FILL — HYDRALAZINE HCL 20 MG/ML IJ SOLN: 20 MG/ML | INTRAMUSCULAR | Qty: 1

## 2023-10-03 MED FILL — HYDROMORPHONE HCL 1 MG/ML IJ SOLN: 1 MG/ML | INTRAMUSCULAR | Qty: 1

## 2023-10-03 MED FILL — XYLOCAINE-MPF 1 % IJ SOLN: 1 % | INTRAMUSCULAR | Qty: 30

## 2023-10-03 MED FILL — HYDROMORPHONE HCL 2 MG PO TABS: 2 MG | ORAL | Qty: 2

## 2023-10-03 MED FILL — PROCHLORPERAZINE EDISYLATE 10 MG/2ML IJ SOLN: 10 MG/2ML | INTRAMUSCULAR | Qty: 2

## 2023-10-03 MED FILL — HEPARIN SODIUM (PORCINE) 1000 UNIT/ML IJ SOLN: 1000 UNIT/ML | INTRAMUSCULAR | Qty: 10

## 2023-10-03 MED FILL — HEPARIN (PORCINE) IN NACL 1000-0.9 UT/500ML-% IV SOLN: 1000-0.9 UT/500ML-% | INTRAVENOUS | Qty: 1000

## 2023-10-03 MED FILL — AMLODIPINE BESYLATE 5 MG PO TABS: 5 MG | ORAL | Qty: 2

## 2023-10-03 MED FILL — ASPIRIN 81 MG PO CHEW: 81 MG | ORAL | Qty: 1

## 2023-10-03 NOTE — Progress Notes (Signed)
 0700: Report given to Joni Reining, Charity fundraiser. RN assumed care of patient at this time.

## 2023-10-03 NOTE — Progress Notes (Signed)
 [place] [facility]  [address] [place] [address] [place], [place] [address]  [phone]        Hospitalist Progress Note      NAME: Melinda [name]   DOB:  Sep 25, 1981  MRM:  [id number]    Date/Time of service: 10/03/2023  12:10 PM       S

## 2023-10-03 NOTE — Progress Notes (Signed)
 Comprehensive Nutrition Assessment    Type and Reason for Visit: Initial, LOS    Nutrition Recommendations/Plan:   Restart diet after procedure - 4 carb   Add Glucerna BID for ease of kcal/pro intake       Malnutrition Assessment:  Malnutrition Status:  Mi

## 2023-10-03 NOTE — Consults (Signed)
 Session ID: [id number]  Language: Spanish  Interpreter ID: #[id number]  Interpreter Name: Lars Mage

## 2023-10-03 NOTE — Consults (Signed)
Session ID: [id number]  Language: Spanish  Interpreter ID: #[id number]  Interpreter Name: Genella Rife

## 2023-10-03 NOTE — Progress Notes (Addendum)
 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

## 2023-10-03 NOTE — Progress Notes (Signed)
 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   R groin with mild strike through on dressing but no hematoma.  Palp pedal pulses.    --ASA and Plavix at time of discharge. Continue for 90 days.  --From my standpoint O

## 2023-10-03 NOTE — Progress Notes (Addendum)
 8:50 AM  Patient arrived. ID and allergies verified verbally with patient. Pt voices understanding of procedure to be performed. Consent obtained. Pt prepped for procedure.    1200  TRANSFER - OUT REPORT:    Verbal report given to dayshift in room 510 on R

## 2023-10-03 NOTE — Op Note (Signed)
 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

## 2023-10-04 ENCOUNTER — Inpatient Hospital Stay: Admit: 2023-10-04 | Payer: MEDICAID | Primary: Adult Health

## 2023-10-04 LAB — BASIC METABOLIC PANEL
Anion Gap: 6 mmol/L (ref 2–12)
BUN/Creatinine Ratio: 7 — ABNORMAL LOW (ref 12–20)
BUN: 17 mg/dL (ref 6–20)
CO2: 27 mmol/L (ref 21–32)
Calcium: 8.1 mg/dL — ABNORMAL LOW (ref 8.5–10.1)
Chloride: 102 mmol/L (ref 97–108)
Creatinine: 2.56 mg/dL — ABNORMAL HIGH (ref 0.55–1.02)
Est, Glom Filt Rate: 23 mL/min/{1.73_m2} — ABNORMAL LOW (ref >60)
Glucose: 165 mg/dL — ABNORMAL HIGH (ref 65–100)
Potassium: 3.9 mmol/L (ref 3.5–5.1)
Sodium: 135 mmol/L — ABNORMAL LOW (ref 136–145)

## 2023-10-04 LAB — POCT GLUCOSE
POC Glucose: 219 mg/dL — ABNORMAL HIGH (ref 65–117)
POC Glucose: 163 mg/dL — ABNORMAL HIGH (ref 65–117)
POC Glucose: 151 mg/dL — ABNORMAL HIGH (ref 65–117)
POC Glucose: 100 mg/dL (ref 65–117)

## 2023-10-04 LAB — MAGNESIUM: Magnesium: 2 mg/dL (ref 1.6–2.4)

## 2023-10-04 LAB — C-REACTIVE PROTEIN: CRP: <0.29 mg/dL (ref 0.00–0.30)

## 2023-10-04 LAB — PHOSPHORUS: Phosphorus: 4.3 mg/dL (ref 2.6–4.7)

## 2023-10-04 LAB — BRAIN NATRIURETIC PEPTIDE: NT Pro-BNP: 2465 pg/mL — ABNORMAL HIGH (ref <125)

## 2023-10-04 MED ORDER — GADOTERATE MEGLUMINE 10 MMOL/20ML IV SOLN
10 | Freq: Once | INTRAVENOUS | Status: AC | PRN
Start: 2023-10-04 — End: 2023-10-03
  Administered 2023-10-04: 01:00:00 11 mL via INTRAVENOUS

## 2023-10-04 MED ORDER — SODIUM CHLORIDE 0.9 % IV SOLN
0.9 % | INTRAVENOUS | Status: DC
Start: 2023-10-04 — End: 2023-10-05
  Administered 2023-10-04: 20:00:00 via INTRAVENOUS

## 2023-10-04 MED FILL — HYDROMORPHONE HCL 2 MG PO TABS: 2 MG | ORAL | Qty: 2

## 2023-10-04 MED FILL — BUSPIRONE HCL 15 MG PO TABS: 15 MG | ORAL | Qty: 1

## 2023-10-04 MED FILL — PANTOPRAZOLE SODIUM 40 MG IV SOLR: 40 MG | INTRAVENOUS | Qty: 40

## 2023-10-04 MED FILL — HYDROMORPHONE HCL 1 MG/ML IJ SOLN: 1 MG/ML | INTRAMUSCULAR | Qty: 1

## 2023-10-04 MED FILL — METOPROLOL TARTRATE 50 MG PO TABS: 50 MG | ORAL | Qty: 1

## 2023-10-04 MED FILL — AMLODIPINE BESYLATE 5 MG PO TABS: 5 MG | ORAL | Qty: 2

## 2023-10-04 MED FILL — LANTUS 100 UNIT/ML SC SOLN: 100 UNIT/ML | SUBCUTANEOUS | Qty: 5

## 2023-10-04 MED FILL — ENOXAPARIN SODIUM 30 MG/0.3ML IJ SOSY: 30 MG/0.3ML | INTRAMUSCULAR | Qty: 0.3

## 2023-10-04 MED FILL — ASPIRIN LOW DOSE 81 MG PO CHEW: 81 MG | ORAL | Qty: 1

## 2023-10-04 MED FILL — DULOXETINE HCL 30 MG PO CPEP: 30 MG | ORAL | Qty: 2

## 2023-10-04 MED FILL — ONDANSETRON HCL 4 MG/2ML IJ SOLN: 4 MG/2ML | INTRAMUSCULAR | Qty: 2

## 2023-10-04 MED FILL — CLOPIDOGREL BISULFATE 75 MG PO TABS: 75 MG | ORAL | Qty: 1

## 2023-10-04 MED FILL — ACETAMINOPHEN 325 MG PO TABS: 325 MG | ORAL | Qty: 2

## 2023-10-04 MED FILL — SODIUM CHLORIDE 0.9 % IV SOLN: 0.9 % | INTRAVENOUS | Qty: 1000

## 2023-10-04 MED FILL — LINZESS 145 MCG PO CAPS: 145 MCG | ORAL | Qty: 2

## 2023-10-04 MED FILL — INSULIN LISPRO 100 UNIT/ML IJ SOLN: 100 UNIT/ML | INTRAMUSCULAR | Qty: 2

## 2023-10-04 MED FILL — GADOTERATE MEGLUMINE 10 MMOL/20ML IV SOLN: 10 MMOL/20ML | INTRAVENOUS | Qty: 20

## 2023-10-04 NOTE — Plan of Care (Signed)
 Problem: Chronic Conditions and Co-morbidities  Goal: Patient's chronic conditions and co-morbidity symptoms are monitored and maintained or improved  Outcome: Progressing     Problem: Pain  Goal: Verbalizes/displays adequate comfort level or baseline comfort level  Outcome: Progressing     Problem: Safety - Adult  Goal: Free from fall injury  Outcome: Progressing     Problem: Nutrition Deficit:  Goal: Optimize nutritional status  Outcome: Progressing

## 2023-10-04 NOTE — Progress Notes (Signed)
 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    Interval History: Eating wel, no SOB. No dysuria or hematuria. Cr still elevated      Review of Systems:Review of systems not obtained due to patient factors.    Current Medications   Current Facility-Administered Medications   Medication Dose Route Frequency    0.9 % sodium chloride  infusion   IntraVENous Continuous    clopidogrel  (PLAVIX ) tablet 75 mg  75 mg Oral Daily    labetalol  (NORMODYNE ;TRANDATE ) injection 20 mg  20 mg IntraVENous Q4H PRN    ondansetron  (ZOFRAN ) injection 4 mg  4 mg IntraVENous Q6H PRN    enoxaparin  Sodium (LOVENOX ) injection 30 mg  30 mg SubCUTAneous Daily    LORazepam  (ATIVAN ) injection 0.26 mg  0.26 mg IntraVENous Q6H PRN    insulin  lispro (HUMALOG ,ADMELOG ) injection vial 0-8 Units  0-8 Units SubCUTAneous 4x Daily AC & HS    DULoxetine  (CYMBALTA ) extended release capsule 60 mg  60 mg Oral Daily    aspirin  chewable tablet 81 mg  81 mg Oral Daily    HYDROmorphone  HCl PF (DILAUDID ) injection 1 mg  1 mg IntraVENous Q3H PRN    HYDROmorphone  (DILAUDID ) tablet 4 mg  4 mg Oral Q4H PRN    [Held by provider] insulin  glargine (LANTUS ) injection vial 5 Units  5 Units SubCUTAneous Daily    hydrALAZINE  (APRESOLINE ) injection 20 mg  20 mg IntraVENous Q6H PRN    metoprolol  tartrate (LOPRESSOR ) tablet 50 mg  50 mg Oral BID    busPIRone  (BUSPAR ) tablet 15 mg  15 mg Oral TID    hydrOXYzine  HCl (ATARAX ) tablet 50 mg  50 mg Oral TID PRN     linaclotide  (LINZESS ) capsule 290 mcg  290 mcg Oral QAM AC    sodium chloride  flush 0.9 % injection 5-40 mL  5-40 mL IntraVENous 2 times per day    sodium chloride  flush 0.9 % injection 5-40 mL  5-40 mL IntraVENous PRN    0.9 % sodium chloride  infusion   IntraVENous PRN    polyethylene glycol (GLYCOLAX ) packet 17 g  17 g Oral Daily PRN    acetaminophen  (TYLENOL ) tablet 650 mg  650 mg Oral Q6H PRN    Or    acetaminophen  (TYLENOL ) suppository 650 mg  650 mg Rectal Q6H PRN    prochlorperazine  (COMPAZINE ) injection 10 mg  10 mg IntraVENous Q6H PRN    dicyclomine  (BENTYL ) injection 20 mg  20 mg IntraMUSCular 4x Daily PRN    glucose chewable tablet 16 g  4 tablet Oral PRN    dextrose  bolus 10% 125 mL  125 mL IntraVENous PRN    Or    dextrose  bolus 10% 250 mL  250 mL IntraVENous PRN    glucagon  injection  1 mg  1 mg SubCUTAneous PRN    dextrose  10 % infusion   IntraVENous Continuous PRN    pantoprazole  (PROTONIX ) 40 mg in sodium chloride  (PF) 0.9 % 10 mL injection  40 mg IntraVENous Q12H    amLODIPine  (NORVASC ) tablet 10 mg  10 mg Oral Daily      No Known Allergies    Objective:  Vitals:    Vitals:    10/04/23 0759 10/04/23 0949 10/04/23 1104 10/04/23 1312   BP: (!) 152/93  (!) 157/89    Pulse: 86  83    Resp: 18 16 16     Temp: 97.5 F (36.4 C)  98.8 F (37.1 C)    TempSrc: Oral  Oral    SpO2: 98%  97%    Height:    1.499 m (4' 11.02")     Intake and Output:  No intake/output data recorded.  12/22 1901 - 12/24 0700  In: -   Out: 5     Physical Examination:  Pt intubated     No  General: NAD   Neck:  Supple, no mass  Resp:  Lungs CTA B/L, no wheezing , normal respiratory effort  CV:  RRR,  no murmur or rub,+1 LE edema  GI:  Soft, NT, + Bowel sounds, no hepatosplenomegaly  Neurologic:  Non focal  Psych:             Alert oriented  Skin:  No Rash  GU:  No Foley    []     High complexity decision making was performed  []     Patient is at high-risk of decompensation with multiple organ involvement    Lab Data Personally  Reviewed: I have reviewed all the pertinent labs, microbiology data and radiology studies during assessment.    Recent Labs     10/02/23  0444 10/03/23  0319 10/04/23  0451   NA 136 137 135*   K 3.2* 3.5 3.9   CL 103 105 102   CO2 27 21 27    BUN 14 15 17    MG 1.5* 1.5* 2.0   PHOS 4.0 4.6 4.3   ALT  --  26  --      Recent Labs     10/02/23  0444 10/03/23  0319   WBC 7.5 7.1   HGB 9.3* 10.5*   HCT 27.9* 31.4*   PLT 511* 577*     No results found for: "SDES"  No components found for: "CULT"  Recent Results (from the past 24 hour(s))   POCT Glucose    Collection Time: 10/03/23  4:31 PM   Result Value Ref Range    POC Glucose 151 (H) 65 - 117 mg/dL    Performed by: Mariana Shipper    POCT Glucose    Collection Time: 10/03/23  8:38 PM   Result Value Ref Range    POC Glucose 219 (H) 65 - 117 mg/dL    Performed by: Katrine Parody    Basic Metabolic Panel    Collection Time: 10/04/23  4:51 AM   Result Value Ref Range    Sodium 135 (L) 136 - 145 mmol/L    Potassium 3.9 3.5 - 5.1 mmol/L    Chloride 102 97 - 108 mmol/L    CO2 27 21 - 32 mmol/L    Anion Gap 6 2 - 12 mmol/L    Glucose 165 (H) 65 - 100 mg/dL    BUN 17 6 - 20 MG/DL    Creatinine  2.56 (H) 0.55 - 1.02 MG/DL    BUN/Creatinine Ratio 7 (L) 12 - 20      Est, Glom Filt Rate 23 (L) >60 ml/min/1.42m2    Calcium  8.1 (L) 8.5 - 10.1 MG/DL   Magnesium     Collection Time: 10/04/23  4:51 AM   Result Value Ref Range    Magnesium  2.0 1.6 - 2.4 mg/dL   Phosphorus    Collection Time: 10/04/23  4:51 AM   Result Value Ref Range    Phosphorus 4.3 2.6 - 4.7 MG/DL   C-Reactive Protein    Collection Time: 10/04/23  4:51 AM   Result Value Ref Range    CRP <0.29 0.00 - 0.30 mg/dL   Brain Natriuretic Peptide    Collection Time: 10/04/23  4:51 AM   Result Value Ref Range    NT Pro-BNP 2,465 (H) <125 PG/ML   POCT Glucose    Collection Time: 10/04/23  8:01 AM   Result Value Ref Range    POC Glucose 163 (H) 65 - 117 mg/dL    Performed by: Stafford Eagles    POCT Glucose    Collection Time: 10/04/23  11:06 AM   Result Value Ref Range    POC Glucose 151 (H) 65 - 117 mg/dL    Performed by: Stafford Eagles        Total time spent with patient:  15   min.                               Care Plan discussed with:  Patient  x   Family   x   RN      Consulting Physician /Specialist        I have reviewed the flowsheets.  Chart reviewed. Pertinent Notes reviewed.   Current Medications list reviewed by me.  No change in PMH ,family and social history from Consult note.    Bette Browner, MD  10/04/2023    [facility]  [address]  [place], [place]  [address]  Phone - [phone]  Fax - [phone]  [email or web address]

## 2023-10-04 NOTE — Progress Notes (Signed)
 [place] [facility] [place] [facility]  [address] [place] [place], [place] [address]  [phone]        Hospitalist Progress Note      NAME: Melinda [name]   DOB:  01/05/1981  MRM:  [id number]    Date/Time of service: 10/04/2023  12:02 PM       Subjective:     Chief Complaint:  Patient was personally seen and examined by me during this time period.  Chart reviewed.  No N/V. Tolerating diet       Objective:       Vitals:       Last 24hrs VS reviewed since prior progress note. Most recent are:    Vitals:    10/04/23 1104   BP: (!) 157/89   Pulse: 83   Resp: 16   Temp: 98.8 F (37.1 C)   SpO2: 97%     SpO2 Readings from Last 6 Encounters:   10/04/23 97%   09/22/23 98%   09/14/23 100%   08/31/23 100%   07/19/23 100%   06/17/23 100%        No intake or output data in the 24 hours ending 10/04/23 1202       Exam:     Physical Exam:    Gen:  Well-developed, well-nourished, obese, NAD  HEENT:  Pink conjunctivae, PERRL, hearing intact to voice, moist mucous membranes  Neck:  Supple, without masses, thyroid non-tender  Resp:  No accessory muscle use, clear breath sounds without wheezes rales or rhonchi  Card:  No murmurs, normal S1, S2 without thrills, +edema  Abd:  Soft, mild diffused pain, non-distended, decreased BS  Musc:  No cyanosis or clubbing, L arm swelling  Skin:  No rashes   Neuro:  Cranial nerves 3-12 are grossly intact, follows commands appropriately  Psych:  poor insight, oriented to person, place and time, alert.  Spanish speaking only    Medications Reviewed: (see below)    Lab Data Reviewed: (see below)    ______________________________________________________________________    Medications:     Current Facility-Administered Medications   Medication Dose Route Frequency    0.9 % sodium chloride  infusion   IntraVENous Continuous    clopidogrel  (PLAVIX ) tablet 75 mg  75 mg Oral Daily    labetalol  (NORMODYNE ;TRANDATE ) injection 20 mg  20 mg IntraVENous Q4H PRN    ondansetron  (ZOFRAN ) injection 4 mg  4  mg IntraVENous Q6H PRN    enoxaparin  Sodium (LOVENOX ) injection 30 mg  30 mg SubCUTAneous Daily    LORazepam  (ATIVAN ) injection 0.26 mg  0.26 mg IntraVENous Q6H PRN    insulin  lispro (HUMALOG ,ADMELOG ) injection vial 0-8 Units  0-8 Units SubCUTAneous 4x Daily AC & HS    DULoxetine  (CYMBALTA ) extended release capsule 60 mg  60 mg Oral Daily    aspirin  chewable tablet 81 mg  81 mg Oral Daily    HYDROmorphone  HCl PF (DILAUDID ) injection 1 mg  1 mg IntraVENous Q3H PRN    HYDROmorphone  (DILAUDID ) tablet 4 mg  4 mg Oral Q4H PRN    [Held by provider] insulin  glargine (LANTUS ) injection vial 5 Units  5 Units SubCUTAneous Daily    hydrALAZINE  (APRESOLINE ) injection 20 mg  20 mg IntraVENous Q6H PRN    metoprolol  tartrate (LOPRESSOR ) tablet 50 mg  50 mg Oral BID    busPIRone  (BUSPAR ) tablet 15 mg  15 mg Oral TID    hydrOXYzine  HCl (ATARAX ) tablet 50 mg  50 mg Oral TID PRN  linaclotide  (LINZESS ) capsule 290 mcg  290 mcg Oral QAM AC    sodium chloride  flush 0.9 % injection 5-40 mL  5-40 mL IntraVENous 2 times per day    sodium chloride  flush 0.9 % injection 5-40 mL  5-40 mL IntraVENous PRN    0.9 % sodium chloride  infusion   IntraVENous PRN    polyethylene glycol (GLYCOLAX ) packet 17 g  17 g Oral Daily PRN    acetaminophen  (TYLENOL ) tablet 650 mg  650 mg Oral Q6H PRN    Or    acetaminophen  (TYLENOL ) suppository 650 mg  650 mg Rectal Q6H PRN    prochlorperazine  (COMPAZINE ) injection 10 mg  10 mg IntraVENous Q6H PRN    dicyclomine  (BENTYL ) injection 20 mg  20 mg IntraMUSCular 4x Daily PRN    glucose chewable tablet 16 g  4 tablet Oral PRN    dextrose  bolus 10% 125 mL  125 mL IntraVENous PRN    Or    dextrose  bolus 10% 250 mL  250 mL IntraVENous PRN    glucagon  injection 1 mg  1 mg SubCUTAneous PRN    dextrose  10 % infusion   IntraVENous Continuous PRN    pantoprazole  (PROTONIX ) 40 mg in sodium chloride  (PF) 0.9 % 10 mL injection  40 mg IntraVENous Q12H    amLODIPine  (NORVASC ) tablet 10 mg  10 mg Oral Daily          Lab Review:      Recent Labs     10/02/23  0444 10/03/23  0319   WBC 7.5 7.1   HGB 9.3* 10.5*   HCT 27.9* 31.4*   PLT 511* 577*     Recent Labs     10/02/23  0444 10/03/23  0319 10/04/23  0451   NA 136 137 135*   K 3.2* 3.5 3.9   CL 103 105 102   CO2 27 21 27    BUN 14 15 17    MG 1.5* 1.5* 2.0   PHOS 4.0 4.6 4.3   ALT  --  26  --      No results found for: "GLUCPOC"       Assessment / Plan:     42 yo Hx of HTN, DM, CKD 3, gastroparesis, cholecystectomy, presented w/ recurrent abd pain, found to have acute pancreatitis, severe distal SMA stenosis.  S/p vascular stenting on 12/23.  Complicated by AKI    1) AKI/CKD 3: worsening, likely from pancreatitis.  Renal U/S neg hydro.  Will cont IVF, IV albumin  prn.  Holding ACEi, bumex .  Renal following    2) Intractable abd pain/N/V: slowly improving.  Likely a combination of pancreatitis and severe SMA stenosis.  Hx of EGD on 12/11 which showed gastritis.  S/p angio/SMA stenting by Vascular on 12/23.  Will cont IV PPI, IV antiemetics, IV dilaudid  prn severe pain    3) Severe distal SMA stenosis: seen on abd CTA.  Vascular performed angio/SMA stenting on 12/23.  Cont ASA, plavix      4) Acute pancreatitis: now improved.  Seen on CT, but lipase low.  Awaiting MRCP.  Advance diet as tolerated.  GI following    5) HTN: cont norvasc , toprol .  Use IV hydralazine , IV labetalol  prn    6) DM type 2: A1C 7.4%.  cont Lantus , SSI    7) Anemia: iron  studies consistent with ACD, will monitor     8) Mood disorder: cont buspar , cymbalta      9) L arm swelling: likely from IV.  Will  check dopplers     **Prior records, notes, labs, radiology, and medications reviewed in [facility]**    Total time spent with patient care: 76 Minutes **I personally saw and examined the patient during this time period**                 Care Plan discussed with: Patient, nursing, family      Discussed:  Care Plan    Prophylaxis:  Lovenox     Disposition:  Home  w/Family           ___________________________________________________    Attending Physician: Tillman Folks, MD

## 2023-10-04 NOTE — Progress Notes (Signed)
 Spiritual Health History and Assessment/Progress Note  [facility]    Rituals, Rites and Sacraments,  ,  ,      Name: Melinda [name] MRN: [id number]    Age: 42 y.o.     Sex: female   Language: Spanish   Religion: Catholic   Intractable nausea and vomiting     Date: 10/04/2023            Total Time Calculated: 5 min              Spiritual Assessment continued in [facility] B5 MULTI-SPECIALTY ONCOLOGY 1        Referral/Consult From: Clergy/Religious Leader   Encounter Overview/Reason: Rituals, Rites and Research scientist (life sciences) Provided For: Patient    Faith, Belief, Meaning:   Patient is connected with a faith tradition or spiritual practice  Family/Friends are connected with a faith tradition or spiritual practice      Importance and Influence:  Patient has spiritual/personal beliefs that influence decisions regarding their health  Family/Friends have spiritual/personal beliefs that influence decisions regarding the patient's health    Community:  Patient is connected with a spiritual community  Family/Friends are connected with a spiritual community:    Assessment and Plan of Care:     Patient Interventions include: Provided sacramental/religious ritual  Family/Friends Interventions include: Provided sacramental/religious ritual    Patient Plan of Care: Spiritual Care available upon further referral  Family/Friends Plan of Care: Spiritual Care available upon further referral    Electronically signed by Nanetta Baars, Chaplain on 10/04/2023 at 1:11 PM     Eucharistic ministry visit.  Melinda [name] was in bed. Her daughter was with her. She sat up in bed and prayer and communion offered.      Chaplain Sr. Nanetta Baars, SBS, RN, ACSW, LCSW  Chaplain Page:  [phone])

## 2023-10-04 NOTE — Progress Notes (Signed)
 [place] [facility]  [place], [place]  [phone]         GI PROGRESS NOTE    NAME: Melinda [name]   DOB:  18-Sep-1981   MRN:  [id number]       Subjective:   Pt reports she feels better than before. 6/10 pain. No worsening pain with palpation of abdomen. Tolerating po.       Objective:   In NAD      VITALS:   Last 24hrs VS reviewed since prior progress note. Most recent are:  Vitals:    10/04/23 1104   BP: (!) 157/89   Pulse: 83   Resp: 16   Temp: 98.8 F (37.1 C)   SpO2: 97%     No intake or output data in the 24 hours ending 10/04/23 1108    PHYSICAL EXAM:  General: Alert, in no acute distress    HEENT: Anicteric sclerae.  Lungs:            CTA Bilaterally.   Heart:  Regular  rhythm,    Abdomen: Soft, Non distended, Non tender.  (+)Bowel sounds, no HSM  Extremities: No c/c/e  Neurologic:  CN 2-12 gi, Alert and oriented X 3.  No acute neurological distress   Psych:   Good insight. Not anxious nor agitated.    Lab Data Reviewed:   Recent Labs     10/02/23  0444 10/03/23  0319   WBC 7.5 7.1   HGB 9.3* 10.5*   HCT 27.9* 31.4*   PLT 511* 577*     Recent Labs     10/03/23  0319 10/04/23  0451   NA 137 135*   K 3.5 3.9   CL 105 102   CO2 21 27   BUN 15 17   PHOS 4.6 4.3     Recent Labs     10/03/23  0319   GLOB 2.8       ________________________________________________________________________  Patient Active Problem List   Diagnosis    Nausea and vomiting    Abdominal pain    AKI (acute kidney injury) (HCC)    Diabetes mellitus type 2 with complications (HCC)    Hypertension    High cholesterol    Intractable nausea and vomiting    CKD stage 3 due to type 2 diabetes mellitus (HCC)    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 female being followed by GI for persistent abdominal pain, nausea, and vomiting.  CTA 12/1/924 demonstrated severe ostial stenosis of SMA. She has been evaluated by vascular surgery and will undergo  angiography Monday 10/03/23. She has also had intermittent evidence on pancreatitis on CT imaging and does meet 2/3 [facility] criteria for acute pancreatitis with epigastric abdominal pain and her CT findings.      Impression:  Abdominal pain   Pancreatitis  SMA stenosis     Plan:  Obtain MRI with and without contrast with MRCP to evaluate for underlying evidence of chronic pancreatitis such as calcifications. MRCP also serves the purpose of identifying or ruling out abnormalities such as pancreatic duct stones or pancreatic cysts which may be contributing to her recurrent symptoms.   IgG 4 in process.   Fecal elastase in process    ------  Follow up 10/04/23     Improving pain. Follow up on MRI/MRCP, appears it was done but  not yet read. Fecal elastase low (191), may benefit from pancrealipase supplementation with meals and snacks if prolonged pancreatitis/diarrhea/abd pain occurs.    GI following,  Signed By: Delphina Fiddler, PA-C     10/04/2023  11:08 AM

## 2023-10-05 LAB — POCT GLUCOSE
POC Glucose: 121 mg/dL — ABNORMAL HIGH (ref 65–117)
POC Glucose: 176 mg/dL — ABNORMAL HIGH (ref 65–117)
POC Glucose: 157 mg/dL — ABNORMAL HIGH (ref 65–117)
POC Glucose: 180 mg/dL — ABNORMAL HIGH (ref 65–117)

## 2023-10-05 LAB — COMPREHENSIVE METABOLIC PANEL
ALT: 26 U/L (ref 12–78)
AST: 27 U/L (ref 15–37)
Albumin/Globulin Ratio: 0.9 — ABNORMAL LOW (ref 1.1–2.2)
Albumin: 2.2 g/dL — ABNORMAL LOW (ref 3.5–5.0)
Alk Phosphatase: 105 U/L (ref 45–117)
Anion Gap: 7 mmol/L (ref 2–12)
BUN/Creatinine Ratio: 6 — ABNORMAL LOW (ref 12–20)
BUN: 15 mg/dL (ref 6–20)
CO2: 26 mmol/L (ref 21–32)
Calcium: 8.1 mg/dL — ABNORMAL LOW (ref 8.5–10.1)
Chloride: 102 mmol/L (ref 97–108)
Creatinine: 2.37 mg/dL — ABNORMAL HIGH (ref 0.55–1.02)
Est, Glom Filt Rate: 26 mL/min/{1.73_m2} — ABNORMAL LOW (ref >60)
Globulin: 2.4 g/dL (ref 2.0–4.0)
Glucose: 150 mg/dL — ABNORMAL HIGH (ref 65–100)
Potassium: 4 mmol/L (ref 3.5–5.1)
Sodium: 135 mmol/L — ABNORMAL LOW (ref 136–145)
Total Bilirubin: 0.3 mg/dL (ref 0.2–1.0)
Total Protein: 4.6 g/dL — ABNORMAL LOW (ref 6.4–8.2)

## 2023-10-05 LAB — PHOSPHORUS: Phosphorus: 3.4 mg/dL (ref 2.6–4.7)

## 2023-10-05 LAB — MAGNESIUM: Magnesium: 2 mg/dL (ref 1.6–2.4)

## 2023-10-05 MED ORDER — ALBUMIN HUMAN 25 % IV SOLN
25 | Freq: Two times a day (BID) | INTRAVENOUS | Status: DC
Start: 2023-10-05 — End: 2023-10-06
  Administered 2023-10-05 – 2023-10-06 (×3): 25 g via INTRAVENOUS

## 2023-10-05 MED ORDER — MORPHINE SULFATE (PF) 2 MG/ML IJ SOLN
2 | INTRAMUSCULAR | Status: DC | PRN
Start: 2023-10-05 — End: 2023-10-05

## 2023-10-05 MED ORDER — HYDROMORPHONE HCL PF 1 MG/ML IJ SOLN
1 | INTRAMUSCULAR | Status: DC | PRN
Start: 2023-10-05 — End: 2023-10-09
  Administered 2023-10-05 – 2023-10-09 (×14): 1 mg via INTRAVENOUS

## 2023-10-05 MED ORDER — FUROSEMIDE 10 MG/ML IJ SOLN
10 | Freq: Two times a day (BID) | INTRAMUSCULAR | Status: DC
Start: 2023-10-05 — End: 2023-10-08
  Administered 2023-10-05 – 2023-10-08 (×7): 20 mg via INTRAVENOUS

## 2023-10-05 MED FILL — ONDANSETRON HCL 4 MG/2ML IJ SOLN: 4 MG/2ML | INTRAMUSCULAR | Qty: 2

## 2023-10-05 MED FILL — CLOPIDOGREL BISULFATE 75 MG PO TABS: 75 MG | ORAL | Qty: 1

## 2023-10-05 MED FILL — DULOXETINE HCL 30 MG PO CPEP: 30 MG | ORAL | Qty: 2

## 2023-10-05 MED FILL — HYDROMORPHONE HCL 1 MG/ML IJ SOLN: 1 MG/ML | INTRAMUSCULAR | Qty: 1

## 2023-10-05 MED FILL — PANTOPRAZOLE SODIUM 40 MG IV SOLR: 40 MG | INTRAVENOUS | Qty: 40

## 2023-10-05 MED FILL — HYDROMORPHONE HCL 2 MG PO TABS: 2 MG | ORAL | Qty: 2

## 2023-10-05 MED FILL — BUSPIRONE HCL 15 MG PO TABS: 15 MG | ORAL | Qty: 1

## 2023-10-05 MED FILL — AMLODIPINE BESYLATE 5 MG PO TABS: 5 MG | ORAL | Qty: 2

## 2023-10-05 MED FILL — METOPROLOL TARTRATE 50 MG PO TABS: 50 MG | ORAL | Qty: 1

## 2023-10-05 MED FILL — ALBUMIN HUMAN 25 % IV SOLN: 25 % | INTRAVENOUS | Qty: 100

## 2023-10-05 MED FILL — ASPIRIN LOW DOSE 81 MG PO CHEW: 81 MG | ORAL | Qty: 1

## 2023-10-05 MED FILL — PROCHLORPERAZINE EDISYLATE 10 MG/2ML IJ SOLN: 10 MG/2ML | INTRAMUSCULAR | Qty: 2

## 2023-10-05 MED FILL — ENOXAPARIN SODIUM 30 MG/0.3ML IJ SOSY: 30 MG/0.3ML | INTRAMUSCULAR | Qty: 0.3

## 2023-10-05 MED FILL — LINZESS 145 MCG PO CAPS: 145 MCG | ORAL | Qty: 2

## 2023-10-05 MED FILL — FUROSEMIDE 10 MG/ML IJ SOLN: 10 MG/ML | INTRAMUSCULAR | Qty: 4

## 2023-10-05 NOTE — Progress Notes (Signed)
 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

## 2023-10-06 ENCOUNTER — Inpatient Hospital Stay: Admit: 2023-10-06 | Payer: MEDICAID | Primary: Adult Health

## 2023-10-06 LAB — COMPREHENSIVE METABOLIC PANEL
ALT: 23 U/L (ref 12–78)
AST: 28 U/L (ref 15–37)
Albumin/Globulin Ratio: 0.9 — ABNORMAL LOW (ref 1.1–2.2)
Albumin: 2.3 g/dL — ABNORMAL LOW (ref 3.5–5.0)
Alk Phosphatase: 114 U/L (ref 45–117)
Anion Gap: 7 mmol/L (ref 2–12)
BUN/Creatinine Ratio: 7 — ABNORMAL LOW (ref 12–20)
BUN: 16 mg/dL (ref 6–20)
CO2: 26 mmol/L (ref 21–32)
Calcium: 8.2 mg/dL — ABNORMAL LOW (ref 8.5–10.1)
Chloride: 100 mmol/L (ref 97–108)
Creatinine: 2.29 mg/dL — ABNORMAL HIGH (ref 0.55–1.02)
Est, Glom Filt Rate: 27 mL/min/{1.73_m2} — ABNORMAL LOW (ref >60)
Globulin: 2.7 g/dL (ref 2.0–4.0)
Glucose: 167 mg/dL — ABNORMAL HIGH (ref 65–100)
Potassium: 4 mmol/L (ref 3.5–5.1)
Sodium: 133 mmol/L — ABNORMAL LOW (ref 136–145)
Total Bilirubin: 0.3 mg/dL (ref 0.2–1.0)
Total Protein: 5 g/dL — ABNORMAL LOW (ref 6.4–8.2)

## 2023-10-06 LAB — POCT GLUCOSE
POC Glucose: 233 mg/dL — ABNORMAL HIGH (ref 65–117)
POC Glucose: 204 mg/dL — ABNORMAL HIGH (ref 65–117)
POC Glucose: 164 mg/dL — ABNORMAL HIGH (ref 65–117)
POC Glucose: 181 mg/dL — ABNORMAL HIGH (ref 65–117)
POC Glucose: 161 mg/dL — ABNORMAL HIGH (ref 65–117)
POC Glucose: 163 mg/dL — ABNORMAL HIGH (ref 65–117)

## 2023-10-06 LAB — CBC WITH AUTO DIFFERENTIAL
Basophils %: 0 % (ref 0–1)
Basophils Absolute: 0 10*3/uL (ref 0.0–0.1)
Eosinophils %: 3 % (ref 0–7)
Eosinophils Absolute: 0.2 10*3/uL (ref 0.0–0.4)
Hematocrit: 24.8 % — ABNORMAL LOW (ref 35.0–47.0)
Hemoglobin: 8.2 g/dL — ABNORMAL LOW (ref 11.5–16.0)
Immature Granulocytes %: 0 % (ref 0.0–0.5)
Immature Granulocytes Absolute: 0 10*3/uL (ref 0.00–0.04)
Lymphocytes %: 13 % (ref 12–49)
Lymphocytes Absolute: 1.2 10*3/uL (ref 0.8–3.5)
MCH: 28.3 pg (ref 26.0–34.0)
MCHC: 33.1 g/dL (ref 30.0–36.5)
MCV: 85.5 fL (ref 80.0–99.0)
MPV: 8.9 fL (ref 8.9–12.9)
Monocytes %: 7 % (ref 5–13)
Monocytes Absolute: 0.7 10*3/uL (ref 0.0–1.0)
Neutrophils %: 77 % — ABNORMAL HIGH (ref 32–75)
Neutrophils Absolute: 6.9 10*3/uL (ref 1.8–8.0)
Nucleated RBCs: 0 /100{WBCs}
Platelets: 438 10*3/uL — ABNORMAL HIGH (ref 150–400)
RBC: 2.9 M/uL — ABNORMAL LOW (ref 3.80–5.20)
RDW: 13.6 % (ref 11.5–14.5)
WBC: 9 10*3/uL (ref 3.6–11.0)
nRBC: 0 10*3/uL (ref 0.00–0.01)

## 2023-10-06 MED ORDER — SODIUM CHLORIDE 0.9 % IV SOLN
0.9 | INTRAVENOUS | Status: AC | PRN
Start: 2023-10-06 — End: ?

## 2023-10-06 MED ORDER — PROCHLORPERAZINE EDISYLATE 10 MG/2ML IJ SOLN
10 | INTRAMUSCULAR | Status: DC | PRN
Start: 2023-10-06 — End: 2023-10-09
  Administered 2023-10-06 – 2023-10-09 (×9): 5 mg via INTRAVENOUS

## 2023-10-06 MED ORDER — POLYETHYLENE GLYCOL 3350 17 G PO PACK
17 | Freq: Two times a day (BID) | ORAL | Status: DC
Start: 2023-10-06 — End: 2023-10-10
  Administered 2023-10-07: 02:00:00 17 g via ORAL

## 2023-10-06 MED ORDER — SENNA-DOCUSATE SODIUM 8.6-50 MG PO TABS
8.6-50 | Freq: Two times a day (BID) | ORAL | Status: DC
Start: 2023-10-06 — End: 2023-10-10
  Administered 2023-10-07 – 2023-10-10 (×6): 2 via ORAL

## 2023-10-06 MED ORDER — POLYETHYLENE GLYCOL 3350 17 G PO PACK
17 | Freq: Every day | ORAL | Status: DC
Start: 2023-10-06 — End: 2023-10-06

## 2023-10-06 MED ORDER — NORMAL SALINE FLUSH 0.9 % IV SOLN
0.9 | Freq: Two times a day (BID) | INTRAVENOUS | Status: AC
Start: 2023-10-06 — End: ?
  Administered 2023-10-06 – 2023-10-14 (×15): 10 mL via INTRAVENOUS

## 2023-10-06 MED ORDER — HYDROMORPHONE HCL 2 MG PO TABS
2 | Freq: Four times a day (QID) | ORAL | Status: DC | PRN
Start: 2023-10-06 — End: 2023-10-09
  Administered 2023-10-06 – 2023-10-08 (×4): 4 mg via ORAL

## 2023-10-06 MED ORDER — ONDANSETRON HCL 4 MG/2ML IJ SOLN
4 | INTRAMUSCULAR | Status: DC
Start: 2023-10-06 — End: 2023-10-07
  Administered 2023-10-06 – 2023-10-07 (×6): 4 mg via INTRAVENOUS

## 2023-10-06 MED ORDER — NORMAL SALINE FLUSH 0.9 % IV SOLN
0.9 | INTRAVENOUS | Status: AC | PRN
Start: 2023-10-06 — End: ?

## 2023-10-06 MED ORDER — NALOXEGOL OXALATE 12.5 MG PO TABS
12.5 | Freq: Every day | ORAL | Status: DC
Start: 2023-10-06 — End: 2023-10-10
  Administered 2023-10-07 – 2023-10-10 (×4): 12.5 mg via ORAL

## 2023-10-06 MED FILL — ONDANSETRON HCL 4 MG/2ML IJ SOLN: 4 MG/2ML | INTRAMUSCULAR | Qty: 2

## 2023-10-06 MED FILL — HYDROMORPHONE HCL 1 MG/ML IJ SOLN: 1 MG/ML | INTRAMUSCULAR | Qty: 1

## 2023-10-06 MED FILL — HYDROMORPHONE HCL 2 MG PO TABS: 2 MG | ORAL | Qty: 2

## 2023-10-06 MED FILL — FUROSEMIDE 10 MG/ML IJ SOLN: 10 MG/ML | INTRAMUSCULAR | Qty: 4

## 2023-10-06 MED FILL — PROCHLORPERAZINE EDISYLATE 10 MG/2ML IJ SOLN: 10 MG/2ML | INTRAMUSCULAR | Qty: 2

## 2023-10-06 MED FILL — BD POSIFLUSH 0.9 % IV SOLN: 0.9 % | INTRAVENOUS | Qty: 40

## 2023-10-06 MED FILL — ASPIRIN LOW DOSE 81 MG PO CHEW: 81 MG | ORAL | Qty: 1

## 2023-10-06 MED FILL — LINZESS 145 MCG PO CAPS: 145 MCG | ORAL | Qty: 2

## 2023-10-06 MED FILL — ENOXAPARIN SODIUM 30 MG/0.3ML IJ SOSY: 30 MG/0.3ML | INTRAMUSCULAR | Qty: 0.3

## 2023-10-06 MED FILL — INSULIN LISPRO 100 UNIT/ML IJ SOLN: 100 UNIT/ML | INTRAMUSCULAR | Qty: 2

## 2023-10-06 MED FILL — BUSPIRONE HCL 15 MG PO TABS: 15 MG | ORAL | Qty: 1

## 2023-10-06 MED FILL — AMLODIPINE BESYLATE 5 MG PO TABS: 5 MG | ORAL | Qty: 2

## 2023-10-06 MED FILL — ALBUMIN HUMAN 25 % IV SOLN: 25 % | INTRAVENOUS | Qty: 100

## 2023-10-06 MED FILL — PANTOPRAZOLE SODIUM 40 MG IV SOLR: 40 MG | INTRAVENOUS | Qty: 40

## 2023-10-06 MED FILL — CLOPIDOGREL BISULFATE 75 MG PO TABS: 75 MG | ORAL | Qty: 1

## 2023-10-06 MED FILL — METOPROLOL TARTRATE 50 MG PO TABS: 50 MG | ORAL | Qty: 1

## 2023-10-06 MED FILL — HYDRALAZINE HCL 20 MG/ML IJ SOLN: 20 MG/ML | INTRAMUSCULAR | Qty: 1

## 2023-10-06 MED FILL — DULOXETINE HCL 30 MG PO CPEP: 30 MG | ORAL | Qty: 2

## 2023-10-06 NOTE — Progress Notes (Signed)
 Ultrasound IV by  :  Rocco Pauls RN  Procedure Note  This patient with GFR of  26, unable to place midline access in upper extremities; will place US guided PIV.     Ultrasound IV education provided to patient. Opportunities for questions given.     Ultra

## 2023-10-06 NOTE — Progress Notes (Addendum)
 [place] [facility]  [address] [place], [place] [address]  [phone]        Hospitalist Progress Note      NAME: Melinda [name]   DOB:  04/28/1981  MRM:  [id number]    Date/Time of service: 10/05/2023  10:48 AM       S

## 2023-10-06 NOTE — Plan of Care (Signed)
 Problem: Chronic Conditions and Co-morbidities  Goal: Patient's chronic conditions and co-morbidity symptoms are monitored and maintained or improved  Outcome: Progressing  Flowsheets (Taken 10/03/2023 2014 by Jeralene Huff, RN)  Care Plan - Patient's

## 2023-10-06 NOTE — Progress Notes (Signed)
 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

## 2023-10-06 NOTE — Other (Signed)
 0345 hydralazine 20 mg IV administered for BP 180/98.  Patient requested pain medication at that time, dilaudid 2mg  tablets x 2 removed from omnicell and scanned for administration, however patient became nauseated and vomited prior to receiving dilaudid.

## 2023-10-06 NOTE — Progress Notes (Signed)
 Charlcie Cradle, ACNP-BC  [phone] office  [phone] voicemail     Gastroenterology Progress Note    October 06, 2023  Admit Date: 09/25/2023         Narrative Assessment and Plan   42 y.o. female being followed by GI for per

## 2023-10-06 NOTE — Progress Notes (Signed)
 Spiritual Health History and Assessment/Progress Note  [facility]    Rituals, Rites and Sacraments,  ,  ,      Name: Melinda [name] MRN: [id number]    Age: 42 y.o.     Sex: female   Language: Spanish   Religion: Catholic   Intractabl

## 2023-10-07 LAB — CBC WITH AUTO DIFFERENTIAL
Basophils %: 1 % (ref 0–1)
Basophils Absolute: 0.1 10*3/uL (ref 0.0–0.1)
Eosinophils %: 3 % (ref 0–7)
Eosinophils Absolute: 0.3 10*3/uL (ref 0.0–0.4)
Hematocrit: 24.7 % — ABNORMAL LOW (ref 35.0–47.0)
Hemoglobin: 8.1 g/dL — ABNORMAL LOW (ref 11.5–16.0)
Immature Granulocytes %: 0 % (ref 0.0–0.5)
Immature Granulocytes Absolute: 0 10*3/uL (ref 0.00–0.04)
Lymphocytes %: 15 % (ref 12–49)
Lymphocytes Absolute: 1.3 10*3/uL (ref 0.8–3.5)
MCH: 28.4 pg (ref 26.0–34.0)
MCHC: 32.8 g/dL (ref 30.0–36.5)
MCV: 86.7 fL (ref 80.0–99.0)
MPV: 9.1 fL (ref 8.9–12.9)
Monocytes %: 10 % (ref 5–13)
Monocytes Absolute: 0.8 10*3/uL (ref 0.0–1.0)
Neutrophils %: 71 % (ref 32–75)
Neutrophils Absolute: 6.1 10*3/uL (ref 1.8–8.0)
Nucleated RBCs: 0 /100{WBCs}
Platelets: 432 10*3/uL — ABNORMAL HIGH (ref 150–400)
RBC: 2.85 M/uL — ABNORMAL LOW (ref 3.80–5.20)
RDW: 13.8 % (ref 11.5–14.5)
WBC: 8.6 10*3/uL (ref 3.6–11.0)
nRBC: 0 10*3/uL (ref 0.00–0.01)

## 2023-10-07 LAB — COMPREHENSIVE METABOLIC PANEL
ALT: 19 U/L (ref 12–78)
AST: 25 U/L (ref 15–37)
Albumin/Globulin Ratio: 0.8 — ABNORMAL LOW (ref 1.1–2.2)
Albumin: 2.3 g/dL — ABNORMAL LOW (ref 3.5–5.0)
Alk Phosphatase: 80 U/L (ref 45–117)
Anion Gap: 7 mmol/L (ref 2–12)
BUN/Creatinine Ratio: 7 — ABNORMAL LOW (ref 12–20)
BUN: 16 mg/dL (ref 6–20)
CO2: 30 mmol/L (ref 21–32)
Calcium: 8.3 mg/dL — ABNORMAL LOW (ref 8.5–10.1)
Chloride: 98 mmol/L (ref 97–108)
Creatinine: 2.34 mg/dL — ABNORMAL HIGH (ref 0.55–1.02)
Est, Glom Filt Rate: 26 mL/min/{1.73_m2} — ABNORMAL LOW (ref >60)
Globulin: 2.8 g/dL (ref 2.0–4.0)
Glucose: 142 mg/dL — ABNORMAL HIGH (ref 65–100)
Potassium: 3.3 mmol/L — ABNORMAL LOW (ref 3.5–5.1)
Sodium: 135 mmol/L — ABNORMAL LOW (ref 136–145)
Total Bilirubin: 0.6 mg/dL (ref 0.2–1.0)
Total Protein: 5.1 g/dL — ABNORMAL LOW (ref 6.4–8.2)

## 2023-10-07 LAB — POCT GLUCOSE
POC Glucose: 208 mg/dL — ABNORMAL HIGH (ref 65–117)
POC Glucose: 207 mg/dL — ABNORMAL HIGH (ref 65–117)
POC Glucose: 181 mg/dL — ABNORMAL HIGH (ref 65–117)
POC Glucose: 173 mg/dL — ABNORMAL HIGH (ref 65–117)

## 2023-10-07 LAB — FILARIASIS ANTIBODIES, IGG4: Filaria Ab IgG4 by ELISA: 0.44 {index}

## 2023-10-07 LAB — MAGNESIUM: Magnesium: 1.7 mg/dL (ref 1.6–2.4)

## 2023-10-07 MED ORDER — METOCLOPRAMIDE HCL 10 MG PO TABS
10 | Freq: Four times a day (QID) | ORAL | Status: DC
Start: 2023-10-07 — End: 2023-10-08
  Administered 2023-10-07 – 2023-10-08 (×4): 10 mg via ORAL

## 2023-10-07 MED ORDER — POTASSIUM CHLORIDE ER 10 MEQ PO TBCR
10 MEQ | Freq: Every day | ORAL | Status: DC
Start: 2023-10-07 — End: 2023-10-12
  Administered 2023-10-07 – 2023-10-08 (×2): 40 meq via ORAL

## 2023-10-07 MED ORDER — ONDANSETRON 4 MG PO TBDP
4 MG | Freq: Three times a day (TID) | ORAL | Status: DC | PRN
Start: 2023-10-07 — End: 2023-10-11
  Administered 2023-10-08 – 2023-10-11 (×5): 4 mg via ORAL

## 2023-10-07 MED ORDER — POTASSIUM CHLORIDE ER 10 MEQ PO TBCR
10 | ORAL | Status: AC
Start: 2023-10-07 — End: 2023-10-07

## 2023-10-07 MED FILL — DULOXETINE HCL 30 MG PO CPEP: 30 MG | ORAL | Qty: 2

## 2023-10-07 MED FILL — HYDROMORPHONE HCL 1 MG/ML IJ SOLN: 1 MG/ML | INTRAMUSCULAR | Qty: 1

## 2023-10-07 MED FILL — BUSPIRONE HCL 15 MG PO TABS: 15 MG | ORAL | Qty: 1

## 2023-10-07 MED FILL — METOCLOPRAMIDE HCL 10 MG PO TABS: 10 MG | ORAL | Qty: 1

## 2023-10-07 MED FILL — HYDROMORPHONE HCL 2 MG PO TABS: 2 MG | ORAL | Qty: 2

## 2023-10-07 MED FILL — PROCHLORPERAZINE EDISYLATE 10 MG/2ML IJ SOLN: 10 MG/2ML | INTRAMUSCULAR | Qty: 2

## 2023-10-07 MED FILL — METOPROLOL TARTRATE 50 MG PO TABS: 50 MG | ORAL | Qty: 1

## 2023-10-07 MED FILL — FUROSEMIDE 10 MG/ML IJ SOLN: 10 MG/ML | INTRAMUSCULAR | Qty: 4

## 2023-10-07 MED FILL — STOOL SOFTENER/LAXATIVE 50-8.6 MG PO TABS: 50-8.6 MG | ORAL | Qty: 2

## 2023-10-07 MED FILL — POLYETHYLENE GLYCOL 3350 17 G PO PACK: 17 g | ORAL | Qty: 1

## 2023-10-07 MED FILL — LORAZEPAM 2 MG/ML IJ SOLN: 2 MG/ML | INTRAMUSCULAR | Qty: 1

## 2023-10-07 MED FILL — INSULIN LISPRO 100 UNIT/ML IJ SOLN: 100 UNIT/ML | INTRAMUSCULAR | Qty: 2

## 2023-10-07 MED FILL — PANTOPRAZOLE SODIUM 40 MG IV SOLR: 40 MG | INTRAVENOUS | Qty: 40

## 2023-10-07 MED FILL — ONDANSETRON HCL 4 MG/2ML IJ SOLN: 4 MG/2ML | INTRAMUSCULAR | Qty: 2

## 2023-10-07 MED FILL — ONDANSETRON 4 MG PO TBDP: 4 MG | ORAL | Qty: 1

## 2023-10-07 MED FILL — MOVANTIK 12.5 MG PO TABS: 12.5 MG | ORAL | Qty: 1

## 2023-10-07 MED FILL — HYDRALAZINE HCL 20 MG/ML IJ SOLN: 20 MG/ML | INTRAMUSCULAR | Qty: 1

## 2023-10-07 MED FILL — LANTUS 100 UNIT/ML SC SOLN: 100 UNIT/ML | SUBCUTANEOUS | Qty: 5

## 2023-10-07 MED FILL — ASPIRIN LOW DOSE 81 MG PO CHEW: 81 MG | ORAL | Qty: 1

## 2023-10-07 MED FILL — POTASSIUM CHLORIDE ER 10 MEQ PO TBCR: 10 MEQ | ORAL | Qty: 4

## 2023-10-07 MED FILL — CLOPIDOGREL BISULFATE 75 MG PO TABS: 75 MG | ORAL | Qty: 1

## 2023-10-07 MED FILL — ENOXAPARIN SODIUM 30 MG/0.3ML IJ SOSY: 30 MG/0.3ML | INTRAMUSCULAR | Qty: 0.3

## 2023-10-07 MED FILL — AMLODIPINE BESYLATE 5 MG PO TABS: 5 MG | ORAL | Qty: 2

## 2023-10-07 NOTE — Progress Notes (Addendum)
 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

## 2023-10-07 NOTE — Care Coordination-Inpatient (Signed)
 Care Management Progress Note    Reason for Admission:   Epigastric pain [R10.13]  Acute renal insufficiency [N28.9]  Intractable nausea and vomiting [R11.2]  Nausea and vomiting, unspecified vomiting type [R11.2]  Intractable abdominal pain [R10.9]  Proce

## 2023-10-07 NOTE — Progress Notes (Addendum)
 Comprehensive Nutrition Assessment    Type and Reason for Visit: Reassess    Nutrition Recommendations/Plan:   Monitor diet tolerance - noted on Clear liquids - pt with reported increased pain/n/v.  Monitor advancement and ability to restart Glucerna.   If

## 2023-10-07 NOTE — Plan of Care (Signed)
 Problem: Chronic Conditions and Co-morbidities  Goal: Patient's chronic conditions and co-morbidity symptoms are monitored and maintained or improved  Outcome: Progressing     Problem: Pain  Goal: Verbalizes/displays adequate comfort level or baseline co

## 2023-10-07 NOTE — Progress Notes (Signed)
 [place] [facility]  [address] [place], [place] [address]  [phone]        Hospitalist Progress Note      NAME: Melinda [name]   DOB:  1981-09-03  MRM:  [id number]    Date/Time of service: 10/07/2023  9:17 AM       Su

## 2023-10-07 NOTE — Progress Notes (Signed)
 Charlcie Cradle, ACNP-BC  [phone] office  [phone] voicemail     Gastroenterology Progress Note    October 07, 2023  Admit Date: 09/25/2023         Narrative Assessment and Plan   42 y.o. female being followed by GI for per

## 2023-10-07 NOTE — Progress Notes (Signed)
 1313 Pt with consistent nausea, vomiting, and abd pain, unable to eat and take any of scheduled medications. Attending made aware of situation.     1440 Pt complaining of 10/10 abd pain, dilaudid given at 1308 and not available again until 1708. No other P

## 2023-10-07 NOTE — Progress Notes (Signed)
 2330: Bladder scan done- 630 mL retained. Patient instructed to try to urinate, patient does not have the urgency.      2355: Bladder scan completed after voiding, retaining 330 mL, NP made aware. Orders placed to straight cath if retaining > 350 mL.    05

## 2023-10-08 LAB — POCT GLUCOSE
POC Glucose: 166 mg/dL — ABNORMAL HIGH (ref 65–117)
POC Glucose: 149 mg/dL — ABNORMAL HIGH (ref 65–117)
POC Glucose: 144 mg/dL — ABNORMAL HIGH (ref 65–117)
POC Glucose: 136 mg/dL — ABNORMAL HIGH (ref 65–117)

## 2023-10-08 LAB — COMPREHENSIVE METABOLIC PANEL
ALT: 19 U/L (ref 12–78)
AST: 22 U/L (ref 15–37)
Albumin/Globulin Ratio: 1.1 (ref 1.1–2.2)
Albumin: 2.6 g/dL — ABNORMAL LOW (ref 3.5–5.0)
Alk Phosphatase: 81 U/L (ref 45–117)
Anion Gap: 4 mmol/L (ref 2–12)
BUN/Creatinine Ratio: 6 — ABNORMAL LOW (ref 12–20)
BUN: 14 mg/dL (ref 6–20)
CO2: 31 mmol/L (ref 21–32)
Calcium: 8.5 mg/dL (ref 8.5–10.1)
Chloride: 101 mmol/L (ref 97–108)
Creatinine: 2.43 mg/dL — ABNORMAL HIGH (ref 0.55–1.02)
Est, Glom Filt Rate: 25 mL/min/{1.73_m2} — ABNORMAL LOW (ref >60)
Globulin: 2.4 g/dL (ref 2.0–4.0)
Glucose: 127 mg/dL — ABNORMAL HIGH (ref 65–100)
Potassium: 3.8 mmol/L (ref 3.5–5.1)
Sodium: 136 mmol/L (ref 136–145)
Total Bilirubin: 0.4 mg/dL (ref 0.2–1.0)
Total Protein: 5 g/dL — ABNORMAL LOW (ref 6.4–8.2)

## 2023-10-08 LAB — CBC WITH AUTO DIFFERENTIAL
Basophils %: 1 % (ref 0–1)
Basophils Absolute: 0.1 10*3/uL (ref 0.0–0.1)
Eosinophils %: 4 % (ref 0–7)
Eosinophils Absolute: 0.3 10*3/uL (ref 0.0–0.4)
Hematocrit: 25 % — ABNORMAL LOW (ref 35.0–47.0)
Hemoglobin: 8.4 g/dL — ABNORMAL LOW (ref 11.5–16.0)
Immature Granulocytes %: 0 % (ref 0.0–0.5)
Immature Granulocytes Absolute: 0 10*3/uL (ref 0.00–0.04)
Lymphocytes %: 23 % (ref 12–49)
Lymphocytes Absolute: 1.7 10*3/uL (ref 0.8–3.5)
MCH: 29.3 pg (ref 26.0–34.0)
MCHC: 33.6 g/dL (ref 30.0–36.5)
MCV: 87.1 fL (ref 80.0–99.0)
MPV: 8.8 fL — ABNORMAL LOW (ref 8.9–12.9)
Monocytes %: 13 % (ref 5–13)
Monocytes Absolute: 1 10*3/uL (ref 0.0–1.0)
Neutrophils %: 59 % (ref 32–75)
Neutrophils Absolute: 4.5 10*3/uL (ref 1.8–8.0)
Nucleated RBCs: 0 /100{WBCs}
Platelets: 446 10*3/uL — ABNORMAL HIGH (ref 150–400)
RBC: 2.87 M/uL — ABNORMAL LOW (ref 3.80–5.20)
RDW: 13.7 % (ref 11.5–14.5)
WBC: 7.5 10*3/uL (ref 3.6–11.0)
nRBC: 0 10*3/uL (ref 0.00–0.01)

## 2023-10-08 MED ORDER — METOCLOPRAMIDE HCL 10 MG PO TABS
10 | Freq: Four times a day (QID) | ORAL | Status: DC
Start: 2023-10-08 — End: 2023-10-09
  Administered 2023-10-08 – 2023-10-09 (×3): 5 mg via ORAL

## 2023-10-08 MED ORDER — FUROSEMIDE 10 MG/ML IJ SOLN
10 | Freq: Every day | INTRAMUSCULAR | Status: DC
Start: 2023-10-08 — End: 2023-10-10

## 2023-10-08 MED FILL — STOOL SOFTENER/LAXATIVE 50-8.6 MG PO TABS: 50-8.6 MG | ORAL | Qty: 2

## 2023-10-08 MED FILL — LANTUS 100 UNIT/ML SC SOLN: 100 UNIT/ML | SUBCUTANEOUS | Qty: 5

## 2023-10-08 MED FILL — POLYETHYLENE GLYCOL 3350 17 G PO PACK: 17 g | ORAL | Qty: 1

## 2023-10-08 MED FILL — METOCLOPRAMIDE HCL 10 MG PO TABS: 10 MG | ORAL | Qty: 1

## 2023-10-08 MED FILL — PANTOPRAZOLE SODIUM 40 MG IV SOLR: 40 MG | INTRAVENOUS | Qty: 40

## 2023-10-08 MED FILL — METOPROLOL TARTRATE 50 MG PO TABS: 50 MG | ORAL | Qty: 1

## 2023-10-08 MED FILL — PROCHLORPERAZINE EDISYLATE 10 MG/2ML IJ SOLN: 10 MG/2ML | INTRAMUSCULAR | Qty: 2

## 2023-10-08 MED FILL — HYDROMORPHONE HCL 1 MG/ML IJ SOLN: 1 MG/ML | INTRAMUSCULAR | Qty: 1

## 2023-10-08 MED FILL — BUSPIRONE HCL 15 MG PO TABS: 15 MG | ORAL | Qty: 1

## 2023-10-08 MED FILL — ENOXAPARIN SODIUM 30 MG/0.3ML IJ SOSY: 30 MG/0.3ML | INTRAMUSCULAR | Qty: 0.3

## 2023-10-08 MED FILL — FUROSEMIDE 10 MG/ML IJ SOLN: 10 MG/ML | INTRAMUSCULAR | Qty: 4

## 2023-10-08 MED FILL — MOVANTIK 12.5 MG PO TABS: 12.5 MG | ORAL | Qty: 1

## 2023-10-08 MED FILL — POTASSIUM CHLORIDE ER 10 MEQ PO TBCR: 10 MEQ | ORAL | Qty: 4

## 2023-10-08 MED FILL — ACETAMINOPHEN 325 MG PO TABS: 325 MG | ORAL | Qty: 2

## 2023-10-08 MED FILL — DULOXETINE HCL 30 MG PO CPEP: 30 MG | ORAL | Qty: 2

## 2023-10-08 MED FILL — CLOPIDOGREL BISULFATE 75 MG PO TABS: 75 MG | ORAL | Qty: 1

## 2023-10-08 MED FILL — AMLODIPINE BESYLATE 5 MG PO TABS: 5 MG | ORAL | Qty: 2

## 2023-10-08 MED FILL — ASPIRIN LOW DOSE 81 MG PO CHEW: 81 MG | ORAL | Qty: 1

## 2023-10-08 MED FILL — DICYCLOMINE HCL 10 MG/ML IM SOLN: 10 MG/ML | INTRAMUSCULAR | Qty: 2

## 2023-10-08 MED FILL — ONDANSETRON 4 MG PO TBDP: 4 MG | ORAL | Qty: 1

## 2023-10-08 MED FILL — HYDROMORPHONE HCL 2 MG PO TABS: 2 MG | ORAL | Qty: 2

## 2023-10-08 NOTE — Progress Notes (Signed)
Labs reviewed    Cr sl up 2.43 but overall relatively stable  Poor PO intake, thus scale back Lasix 20mg  IV BID to qd  Daily BMP  Monitor I/Os  If plans for DC soon, can send on PO Lasix 20mg  qd and f/u w/ renal in 2-3 wks  Call for questions

## 2023-10-08 NOTE — Progress Notes (Signed)
 [place] [facility]  [address] [place], [place] [address]  [phone]        Hospitalist Progress Note      NAME: Melinda [name]   DOB:  08-07-1981  MRM:  [id number]    Date/Time of service: 10/08/2023  1:53 PM       Su

## 2023-10-08 NOTE — Progress Notes (Signed)
 Physical Therapy    Order received. Chart reviewed and screen completed. Patient has been ambulatory within room and hallway independently. Patient denies the need for acute PT services. Will complete order. Please consult if there is a change in status.

## 2023-10-08 NOTE — Progress Notes (Signed)
 Pharmacy Dosing Note    Ordered medication: Metoclopramide 10 mg PO every 6 hours    New medication: Change to metoclopramide 5 mg PO every 6 hours per [facility] renal dosing protocol.    Estimated Creatinine Clearance: 26 mL/min (A) (based on SCr of 2.43 mg/dL

## 2023-10-08 NOTE — Progress Notes (Signed)
 Report given to [facility], Charity fundraiser. RN assumed care of patient at this time.

## 2023-10-08 NOTE — Progress Notes (Signed)
 OT order received, chart reviewed, team members consulted. No ADL related deficits identified. Acute OT services not indicated  Continue to encourage ADL independence and OOB daily to reduce risk of deconditioning during lengthy hospitalization   Doyce Loose

## 2023-10-09 ENCOUNTER — Inpatient Hospital Stay: Admit: 2023-10-09 | Payer: MEDICAID | Primary: Adult Health

## 2023-10-09 LAB — POCT GLUCOSE
POC Glucose: 212 mg/dL — ABNORMAL HIGH (ref 65–117)
POC Glucose: 163 mg/dL — ABNORMAL HIGH (ref 65–117)
POC Glucose: 147 mg/dL — ABNORMAL HIGH (ref 65–117)
POC Glucose: 171 mg/dL — ABNORMAL HIGH (ref 65–117)
POC Glucose: 173 mg/dL — ABNORMAL HIGH (ref 65–117)

## 2023-10-09 LAB — CBC WITH AUTO DIFFERENTIAL
Basophils %: 1 % (ref 0–1)
Basophils Absolute: 0.1 10*3/uL (ref 0.0–0.1)
Eosinophils %: 5 % (ref 0–7)
Eosinophils Absolute: 0.4 10*3/uL (ref 0.0–0.4)
Hematocrit: 27.9 % — ABNORMAL LOW (ref 35.0–47.0)
Hemoglobin: 9.3 g/dL — ABNORMAL LOW (ref 11.5–16.0)
Immature Granulocytes %: 0 % (ref 0.0–0.5)
Immature Granulocytes Absolute: 0 10*3/uL (ref 0.00–0.04)
Lymphocytes %: 24 % (ref 12–49)
Lymphocytes Absolute: 2 10*3/uL (ref 0.8–3.5)
MCH: 28.5 pg (ref 26.0–34.0)
MCHC: 33.3 g/dL (ref 30.0–36.5)
MCV: 85.6 fL (ref 80.0–99.0)
MPV: 8.6 fL — ABNORMAL LOW (ref 8.9–12.9)
Monocytes %: 11 % (ref 5–13)
Monocytes Absolute: 0.9 10*3/uL (ref 0.0–1.0)
Neutrophils %: 59 % (ref 32–75)
Neutrophils Absolute: 4.8 10*3/uL (ref 1.8–8.0)
Nucleated RBCs: 0 /100{WBCs}
Platelets: 528 10*3/uL — ABNORMAL HIGH (ref 150–400)
RBC: 3.26 M/uL — ABNORMAL LOW (ref 3.80–5.20)
RDW: 13.4 % (ref 11.5–14.5)
WBC: 8.1 10*3/uL (ref 3.6–11.0)
nRBC: 0 10*3/uL (ref 0.00–0.01)

## 2023-10-09 LAB — BASIC METABOLIC PANEL
Anion Gap: 4 mmol/L (ref 2–12)
BUN/Creatinine Ratio: 5 — ABNORMAL LOW (ref 12–20)
BUN: 10 mg/dL (ref 6–20)
CO2: 30 mmol/L (ref 21–32)
Calcium: 8.6 mg/dL (ref 8.5–10.1)
Chloride: 99 mmol/L (ref 97–108)
Creatinine: 2.11 mg/dL — ABNORMAL HIGH (ref 0.55–1.02)
Est, Glom Filt Rate: 30 mL/min/{1.73_m2} — ABNORMAL LOW (ref >60)
Glucose: 140 mg/dL — ABNORMAL HIGH (ref 65–100)
Potassium: 4.4 mmol/L (ref 3.5–5.1)
Sodium: 133 mmol/L — ABNORMAL LOW (ref 136–145)

## 2023-10-09 LAB — LIPASE: Lipase: 12 U/L — ABNORMAL LOW (ref 13–75)

## 2023-10-09 MED ORDER — METOCLOPRAMIDE HCL 5 MG/ML IJ SOLN
5 MG/ML | Freq: Four times a day (QID) | INTRAMUSCULAR | Status: DC
Start: 2023-10-09 — End: 2023-10-12
  Administered 2023-10-09 – 2023-10-12 (×13): 5 mg via INTRAVENOUS

## 2023-10-09 MED ORDER — HYDROMORPHONE HCL PF 1 MG/ML IJ SOLN
1 | INTRAMUSCULAR | Status: DC | PRN
Start: 2023-10-09 — End: 2023-10-09

## 2023-10-09 MED ORDER — ENOXAPARIN SODIUM 40 MG/0.4ML IJ SOSY
40 MG/0.4ML | Freq: Every day | INTRAMUSCULAR | Status: DC
Start: 2023-10-09 — End: 2023-10-14
  Administered 2023-10-11 – 2023-10-12 (×2): 40 mg via SUBCUTANEOUS

## 2023-10-09 MED ORDER — SODIUM CHLORIDE 0.9 % IV SOLN
0.9 | INTRAVENOUS | Status: DC
Start: 2023-10-09 — End: 2023-10-10
  Administered 2023-10-09 – 2023-10-10 (×2): via INTRAVENOUS

## 2023-10-09 MED ORDER — PROMETHAZINE HCL 25 MG/ML IJ SOLN
25 | Freq: Four times a day (QID) | INTRAMUSCULAR | Status: DC | PRN
Start: 2023-10-09 — End: 2023-10-14
  Administered 2023-10-11: 20:00:00 25 mg via INTRAVENOUS

## 2023-10-09 MED ORDER — METOCLOPRAMIDE HCL 5 MG/ML IJ SOLN
5 | Freq: Four times a day (QID) | INTRAMUSCULAR | Status: DC
Start: 2023-10-09 — End: 2023-10-09

## 2023-10-09 MED ORDER — HYDROMORPHONE HCL PF 1 MG/ML IJ SOLN
1 | INTRAMUSCULAR | Status: DC | PRN
Start: 2023-10-09 — End: 2023-10-10
  Administered 2023-10-09 – 2023-10-10 (×7): 1 mg via INTRAVENOUS

## 2023-10-09 MED ORDER — HYDROMORPHONE HCL 2 MG PO TABS
2 MG | ORAL | Status: DC | PRN
Start: 2023-10-09 — End: 2023-10-11
  Administered 2023-10-10: 22:00:00 4 mg via ORAL

## 2023-10-09 MED FILL — POLYETHYLENE GLYCOL 3350 17 G PO PACK: 17 g | ORAL | Qty: 1

## 2023-10-09 MED FILL — DULOXETINE HCL 30 MG PO CPEP: 30 MG | ORAL | Qty: 2

## 2023-10-09 MED FILL — HYDROMORPHONE HCL 1 MG/ML IJ SOLN: 1 MG/ML | INTRAMUSCULAR | Qty: 1

## 2023-10-09 MED FILL — PANTOPRAZOLE SODIUM 40 MG IV SOLR: 40 MG | INTRAVENOUS | Qty: 40

## 2023-10-09 MED FILL — PROMETHAZINE HCL 25 MG/ML IJ SOLN: 25 MG/ML | INTRAMUSCULAR | Qty: 1

## 2023-10-09 MED FILL — PROCHLORPERAZINE EDISYLATE 10 MG/2ML IJ SOLN: 10 MG/2ML | INTRAMUSCULAR | Qty: 2

## 2023-10-09 MED FILL — POTASSIUM CHLORIDE ER 10 MEQ PO TBCR: 10 MEQ | ORAL | Qty: 4

## 2023-10-09 MED FILL — LORAZEPAM 2 MG/ML IJ SOLN: 2 MG/ML | INTRAMUSCULAR | Qty: 1

## 2023-10-09 MED FILL — SODIUM CHLORIDE 0.9 % IV SOLN: 0.9 % | INTRAVENOUS | Qty: 1000

## 2023-10-09 MED FILL — METOPROLOL TARTRATE 50 MG PO TABS: 50 MG | ORAL | Qty: 1

## 2023-10-09 MED FILL — ONDANSETRON 4 MG PO TBDP: 4 MG | ORAL | Qty: 1

## 2023-10-09 MED FILL — METOCLOPRAMIDE HCL 5 MG/ML IJ SOLN: 5 MG/ML | INTRAMUSCULAR | Qty: 2

## 2023-10-09 MED FILL — MOVANTIK 12.5 MG PO TABS: 12.5 MG | ORAL | Qty: 1

## 2023-10-09 MED FILL — LANTUS 100 UNIT/ML SC SOLN: 100 UNIT/ML | SUBCUTANEOUS | Qty: 5

## 2023-10-09 MED FILL — STOOL SOFTENER/LAXATIVE 50-8.6 MG PO TABS: 50-8.6 MG | ORAL | Qty: 2

## 2023-10-09 MED FILL — BUSPIRONE HCL 15 MG PO TABS: 15 MG | ORAL | Qty: 1

## 2023-10-09 MED FILL — ENOXAPARIN SODIUM 30 MG/0.3ML IJ SOSY: 30 MG/0.3ML | INTRAMUSCULAR | Qty: 0.3

## 2023-10-09 MED FILL — ASPIRIN LOW DOSE 81 MG PO CHEW: 81 MG | ORAL | Qty: 1

## 2023-10-09 MED FILL — METOCLOPRAMIDE HCL 10 MG PO TABS: 10 MG | ORAL | Qty: 1

## 2023-10-09 MED FILL — INSULIN LISPRO 100 UNIT/ML IJ SOLN: 100 UNIT/ML | INTRAMUSCULAR | Qty: 1

## 2023-10-09 MED FILL — AMLODIPINE BESYLATE 5 MG PO TABS: 5 MG | ORAL | Qty: 2

## 2023-10-09 MED FILL — FUROSEMIDE 10 MG/ML IJ SOLN: 10 MG/ML | INTRAMUSCULAR | Qty: 4

## 2023-10-09 MED FILL — LABETALOL HCL 5 MG/ML IV SOLN: 5 MG/ML | INTRAVENOUS | Qty: 4

## 2023-10-09 MED FILL — HYDRALAZINE HCL 20 MG/ML IJ SOLN: 20 MG/ML | INTRAMUSCULAR | Qty: 1

## 2023-10-09 MED FILL — HYDROMORPHONE HCL 2 MG PO TABS: 2 MG | ORAL | Qty: 2

## 2023-10-09 MED FILL — INSULIN LISPRO 100 UNIT/ML IJ SOLN: 100 UNIT/ML | INTRAMUSCULAR | Qty: 2

## 2023-10-09 MED FILL — CLOPIDOGREL BISULFATE 75 MG PO TABS: 75 MG | ORAL | Qty: 1

## 2023-10-09 NOTE — Progress Notes (Signed)
 Gastroenterology attending progress note    Objective  42 year old Spanish-speaking Hispanic female in hospital with severe chronic idiopathic constipation, no focal lesions found on prior colonoscopy, EGD to account for chronic abdominal pain, serial CT i

## 2023-10-09 NOTE — Progress Notes (Signed)
 [place] [facility]  [address] [place], [place] [address]  [phone]        Hospitalist Progress Note      NAME: Kaysen Sefcik   DOB:  04-22-1981  MRM:  [id number]    Date/Time of service: 10/09/2023  2:44 PM       Su

## 2023-10-09 NOTE — Plan of Care (Signed)
 Problem: Chronic Conditions and Co-morbidities  Goal: Patient's chronic conditions and co-morbidity symptoms are monitored and maintained or improved  Outcome: Progressing     Problem: Pain  Goal: Verbalizes/displays adequate comfort level or baseline co

## 2023-10-09 NOTE — Consults (Signed)
 Session ID: [id number]  Language: Spanish  Interpreter ID: #[id number]  Interpreter Name: Earlyne Iba

## 2023-10-09 NOTE — Consults (Signed)
 Session ID: [id number]  Language: Spanish  Interpreter ID: #[id number]  Interpreter Name: Fausto Skillern

## 2023-10-09 NOTE — Consults (Signed)
 Session ID: [id number]  Language: Spanish  Interpreter ID: #[id number]  Interpreter Name: Jari Favre

## 2023-10-10 ENCOUNTER — Inpatient Hospital Stay: Admit: 2023-10-10 | Payer: MEDICAID | Primary: Adult Health

## 2023-10-10 LAB — CBC WITH AUTO DIFFERENTIAL
Basophils %: 0 % (ref 0–1)
Basophils Absolute: 0 10*3/uL (ref 0.0–0.1)
Eosinophils %: 3 % (ref 0–7)
Eosinophils Absolute: 0.2 10*3/uL (ref 0.0–0.4)
Hematocrit: 28.6 % — ABNORMAL LOW (ref 35.0–47.0)
Hemoglobin: 9.4 g/dL — ABNORMAL LOW (ref 11.5–16.0)
Immature Granulocytes %: 0 % (ref 0.0–0.5)
Immature Granulocytes Absolute: 0 10*3/uL (ref 0.00–0.04)
Lymphocytes %: 18 % (ref 12–49)
Lymphocytes Absolute: 1.7 10*3/uL (ref 0.8–3.5)
MCH: 28.6 pg (ref 26.0–34.0)
MCHC: 32.9 g/dL (ref 30.0–36.5)
MCV: 86.9 fL (ref 80.0–99.0)
MPV: 8.4 fL — ABNORMAL LOW (ref 8.9–12.9)
Monocytes %: 8 % (ref 5–13)
Monocytes Absolute: 0.7 10*3/uL (ref 0.0–1.0)
Neutrophils %: 71 % (ref 32–75)
Neutrophils Absolute: 6.6 10*3/uL (ref 1.8–8.0)
Nucleated RBCs: 0 /100{WBCs}
Platelets: 502 10*3/uL — ABNORMAL HIGH (ref 150–400)
RBC: 3.29 M/uL — ABNORMAL LOW (ref 3.80–5.20)
RDW: 13.6 % (ref 11.5–14.5)
WBC: 9.3 10*3/uL (ref 3.6–11.0)
nRBC: 0 10*3/uL (ref 0.00–0.01)

## 2023-10-10 LAB — BASIC METABOLIC PANEL
Anion Gap: 6 mmol/L (ref 2–12)
BUN/Creatinine Ratio: 5 — ABNORMAL LOW (ref 12–20)
BUN: 10 mg/dL (ref 6–20)
CO2: 28 mmol/L (ref 21–32)
Calcium: 8.4 mg/dL — ABNORMAL LOW (ref 8.5–10.1)
Chloride: 103 mmol/L (ref 97–108)
Creatinine: 2.13 mg/dL — ABNORMAL HIGH (ref 0.55–1.02)
Est, Glom Filt Rate: 29 mL/min/{1.73_m2} — ABNORMAL LOW (ref >60)
Glucose: 172 mg/dL — ABNORMAL HIGH (ref 65–100)
Potassium: 4.1 mmol/L (ref 3.5–5.1)
Sodium: 137 mmol/L (ref 136–145)

## 2023-10-10 LAB — POCT GLUCOSE
POC Glucose: 222 mg/dL — ABNORMAL HIGH (ref 65–117)
POC Glucose: 178 mg/dL — ABNORMAL HIGH (ref 65–117)
POC Glucose: 194 mg/dL — ABNORMAL HIGH (ref 65–117)
POC Glucose: 157 mg/dL — ABNORMAL HIGH (ref 65–117)

## 2023-10-10 LAB — PREALBUMIN: Prealbumin: 16.5 mg/dL — ABNORMAL LOW (ref 20.0–40.0)

## 2023-10-10 LAB — MAGNESIUM: Magnesium: 1.6 mg/dL (ref 1.6–2.4)

## 2023-10-10 MED ORDER — HYDROMORPHONE HCL PF 1 MG/ML IJ SOLN
1 | INTRAMUSCULAR | Status: DC | PRN
Start: 2023-10-10 — End: 2023-10-10
  Administered 2023-10-10 (×2): 1 mg via INTRAVENOUS

## 2023-10-10 MED ORDER — THIAMINE HCL 100 MG/ML IJ SOLN
100 | INTRAMUSCULAR | Status: DC
Start: 2023-10-10 — End: 2023-10-10

## 2023-10-10 MED ORDER — METOPROLOL TARTRATE 50 MG PO TABS
50 MG | Freq: Two times a day (BID) | ORAL | Status: DC
Start: 2023-10-10 — End: 2023-10-11
  Administered 2023-10-10: 22:00:00 50 mg via ORAL

## 2023-10-10 MED ORDER — CLONIDINE 0.2 MG/24HR TD PTWK
0.2 MG/24HR | TRANSDERMAL | Status: DC
Start: 2023-10-10 — End: 2023-10-11
  Administered 2023-10-10: 1 via TRANSDERMAL

## 2023-10-10 MED ORDER — INSULIN LISPRO 100 UNIT/ML IJ SOLN
100 | Freq: Four times a day (QID) | INTRAMUSCULAR | Status: AC
Start: 2023-10-10 — End: ?
  Administered 2023-10-11 – 2023-10-14 (×5): 2 [IU] via SUBCUTANEOUS
  Administered 2023-10-14: 18:00:00 4 [IU] via SUBCUTANEOUS
  Administered 2023-10-14: 05:00:00 2 [IU] via SUBCUTANEOUS

## 2023-10-10 MED ORDER — LIDOCAINE HCL (PF) 1 % IJ SOLN
1 | INTRAMUSCULAR | Status: AC | PRN
Start: 2023-10-10 — End: 2023-10-10
  Administered 2023-10-10: 19:00:00 2 via INTRADERMAL

## 2023-10-10 MED ORDER — SODIUM CHLORIDE 0.9 % IV SOLN
0.9 | INTRAVENOUS | Status: DC
Start: 2023-10-10 — End: 2023-10-10

## 2023-10-10 MED ORDER — HYDROMORPHONE HCL PF 1 MG/ML IJ SOLN
1 MG/ML | INTRAMUSCULAR | Status: DC | PRN
Start: 2023-10-10 — End: 2023-10-11
  Administered 2023-10-10 – 2023-10-11 (×5): 1 mg via INTRAVENOUS

## 2023-10-10 MED ORDER — INFUVITE ADULT IV SOLN
4.25 | INTRAVENOUS | Status: AC
Start: 2023-10-10 — End: 2023-10-11
  Administered 2023-10-10: via INTRAVENOUS

## 2023-10-10 MED FILL — MOVANTIK 12.5 MG PO TABS: 12.5 MG | ORAL | Qty: 1

## 2023-10-10 MED FILL — METOPROLOL TARTRATE 50 MG PO TABS: 50 MG | ORAL | Qty: 1

## 2023-10-10 MED FILL — HYDRALAZINE HCL 20 MG/ML IJ SOLN: 20 MG/ML | INTRAMUSCULAR | Qty: 1

## 2023-10-10 MED FILL — POLYETHYLENE GLYCOL 3350 17 G PO PACK: 17 g | ORAL | Qty: 1

## 2023-10-10 MED FILL — INSULIN LISPRO 100 UNIT/ML IJ SOLN: 100 UNIT/ML | INTRAMUSCULAR | Qty: 2

## 2023-10-10 MED FILL — HYDROMORPHONE HCL 1 MG/ML IJ SOLN: 1 MG/ML | INTRAMUSCULAR | Qty: 1

## 2023-10-10 MED FILL — BUSPIRONE HCL 15 MG PO TABS: 15 MG | ORAL | Qty: 1

## 2023-10-10 MED FILL — CLINIMIX E/DEXTROSE (4.25/10) 4.25 % IV SOLN: 4.25 % | INTRAVENOUS | Qty: 1000

## 2023-10-10 MED FILL — ONDANSETRON 4 MG PO TBDP: 4 MG | ORAL | Qty: 1

## 2023-10-10 MED FILL — DICYCLOMINE HCL 10 MG/ML IM SOLN: 10 MG/ML | INTRAMUSCULAR | Qty: 2

## 2023-10-10 MED FILL — METOCLOPRAMIDE HCL 5 MG/ML IJ SOLN: 5 MG/ML | INTRAMUSCULAR | Qty: 2

## 2023-10-10 MED FILL — LINZESS 145 MCG PO CAPS: 145 MCG | ORAL | Qty: 2

## 2023-10-10 MED FILL — LABETALOL HCL 5 MG/ML IV SOLN: 5 MG/ML | INTRAVENOUS | Qty: 4

## 2023-10-10 MED FILL — CLONIDINE 0.2 MG/24HR TD PTWK: 0.2 MG/24HR | TRANSDERMAL | Qty: 1

## 2023-10-10 MED FILL — HYDROMORPHONE HCL 2 MG PO TABS: 2 MG | ORAL | Qty: 2

## 2023-10-10 MED FILL — SODIUM CHLORIDE 0.9 % IV SOLN: 0.9 % | INTRAVENOUS | Qty: 1000

## 2023-10-10 MED FILL — LANTUS 100 UNIT/ML SC SOLN: 100 UNIT/ML | SUBCUTANEOUS | Qty: 5

## 2023-10-10 MED FILL — PANTOPRAZOLE SODIUM 40 MG IV SOLR: 40 MG | INTRAVENOUS | Qty: 40

## 2023-10-10 MED FILL — PROMETHAZINE HCL 25 MG/ML IJ SOLN: 25 MG/ML | INTRAMUSCULAR | Qty: 1

## 2023-10-10 MED FILL — STOOL SOFTENER/LAXATIVE 50-8.6 MG PO TABS: 50-8.6 MG | ORAL | Qty: 2

## 2023-10-10 NOTE — Progress Notes (Signed)
 [place] 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

## 2023-10-10 NOTE — Procedures (Signed)
 Interventional Radiology Brief Postoperative Note    Procedure Date:  10/10/2023    Procedure:  Exchange of right 19 cm IJ central venous access with shorter 16 cm right IJ CVC    Operator:  Emilia Beck, NP    Attending:  Kerin Perna, MD    Preoperati

## 2023-10-10 NOTE — Consults (Signed)
 Session ID: [id number]  Language: Spanish  Interpreter ID: #[id number]  Interpreter Name: Cherlyn Labella

## 2023-10-10 NOTE — Progress Notes (Signed)
 TRANSFER - OUT REPORT:    Verbal report given to MS RN on Timmothy Euler [facility]ros being transferred to [facility] for routine progression of patient care       Report consisted of patient's Situation, Background, Assessment and   Recommendations(SBAR).     Informat

## 2023-10-10 NOTE — Progress Notes (Signed)
 TRANSFER - OUT REPORT:    Verbal report given to MS RN on Melinda [name] being transferred to [facility] for routine progression of patient care       Report consisted of patient's Situation, Background, Assessment and   Recommendations(SBAR).     Informat

## 2023-10-10 NOTE — Progress Notes (Signed)
 Comprehensive Nutrition Assessment    Type and Reason for Visit: Reassess    Nutrition Recommendations/Plan:   Monitor diet tolerance- pt advanced to Clears again today (12/30) with subsequent copious vomiting (not tolerating).  Start PN - PPN @ 42 mL/hr.

## 2023-10-10 NOTE — Plan of Care (Signed)
 Problem: Chronic Conditions and Co-morbidities  Goal: Patient's chronic conditions and co-morbidity symptoms are monitored and maintained or improved  Outcome: HH/HSPC Progressing     Problem: Pain  Goal: Verbalizes/displays adequate comfort level or bas

## 2023-10-10 NOTE — Progress Notes (Signed)
 Melinda [name], ACNP-BC  [phone] office  [phone] voicemail     Gastroenterology Progress Note    October 10, 2023  Admit Date: 09/25/2023         Narrative Assessment and Plan   42 year old Spanish-speaking Hispanic femal

## 2023-10-10 NOTE — Progress Notes (Signed)
 [place] [facility]  [address] [place], [place] [address]  [phone]        Hospitalist Progress Note      NAME: Melinda [name]   DOB:  01-03-1981  MRM:  [id number]    Date/Time of service: 10/10/2023  2:26 PM       Su

## 2023-10-10 NOTE — Progress Notes (Addendum)
 Pt feels improved. Advanced to clears. Less pain. No N/V. Did have diarrhea. Full note to follow     With translator present, after clear liquids, pt with copious vomiting of clears liquids from this breakfast

## 2023-10-10 NOTE — Consults (Signed)
 Nutrition Note    Consult for PN. Noted pt appears to be improving. Monitor PO tolerance and ability to advance diet past clears. As pt has been without adequate nutrition x 14 days now, unless pt can advance to [facility] or better very soon (1-2 days), r

## 2023-10-11 LAB — CBC WITH AUTO DIFFERENTIAL
Basophils %: 1 % (ref 0–1)
Basophils Absolute: 0 10*3/uL (ref 0.0–0.1)
Eosinophils %: 2 % (ref 0–7)
Eosinophils Absolute: 0.1 10*3/uL (ref 0.0–0.4)
Hematocrit: 36.9 % (ref 35.0–47.0)
Immature Granulocytes %: 0 % (ref 0.0–0.5)
Immature Granulocytes Absolute: 0 10*3/uL (ref 0.00–0.04)
Lymphocytes %: 19 % (ref 12–49)
Lymphocytes Absolute: 1.1 10*3/uL (ref 0.8–3.5)
MCH: 28.3 pg (ref 26.0–34.0)
MCHC: 32.5 g/dL (ref 30.0–36.5)
MCV: 87 fL (ref 80.0–99.0)
MPV: 8.4 fL — ABNORMAL LOW (ref 8.9–12.9)
Monocytes %: 9 % (ref 5–13)
Monocytes Absolute: 0.5 10*3/uL (ref 0.0–1.0)
Neutrophils %: 70 % (ref 32–75)
Neutrophils Absolute: 3.9 10*3/uL (ref 1.8–8.0)
Nucleated RBCs: 0 /100{WBCs}
Platelets: 415 10*3/uL — ABNORMAL HIGH (ref 150–400)
RBC: 4.24 M/uL (ref 3.80–5.20)
RDW: 13.7 % (ref 11.5–14.5)
WBC: 5.7 10*3/uL (ref 3.6–11.0)
nRBC: 0 10*3/uL (ref 0.00–0.01)

## 2023-10-11 LAB — MAGNESIUM: Magnesium: 1.7 mg/dL (ref 1.6–2.4)

## 2023-10-11 LAB — BASIC METABOLIC PANEL
Anion Gap: 6 mmol/L (ref 2–12)
BUN/Creatinine Ratio: 5 — ABNORMAL LOW (ref 12–20)
BUN: 9 mg/dL (ref 6–20)
CO2: 29 mmol/L (ref 21–32)
Calcium: 8.9 mg/dL (ref 8.5–10.1)
Chloride: 102 mmol/L (ref 97–108)
Creatinine: 1.84 mg/dL — ABNORMAL HIGH (ref 0.55–1.02)
Est, Glom Filt Rate: 35 mL/min/{1.73_m2} — ABNORMAL LOW (ref >60)
Glucose: 186 mg/dL — ABNORMAL HIGH (ref 65–100)
Potassium: 3.5 mmol/L (ref 3.5–5.1)
Sodium: 137 mmol/L (ref 136–145)

## 2023-10-11 LAB — POCT GLUCOSE
POC Glucose: 195 mg/dL — ABNORMAL HIGH (ref 65–117)
POC Glucose: 152 mg/dL — ABNORMAL HIGH (ref 65–117)
POC Glucose: 217 mg/dL — ABNORMAL HIGH (ref 65–117)
POC Glucose: 208 mg/dL — ABNORMAL HIGH (ref 65–117)
POC Glucose: 120 mg/dL — ABNORMAL HIGH (ref 65–117)

## 2023-10-11 LAB — PHOSPHORUS: Phosphorus: 4.1 mg/dL (ref 2.6–4.7)

## 2023-10-11 MED ORDER — BUPRENORPHINE HCL-NALOXONE HCL 2-0.5 MG SL FILM
2-0.5 | SUBLINGUAL | Status: DC | PRN
Start: 2023-10-11 — End: 2023-10-11

## 2023-10-11 MED ORDER — DICYCLOMINE HCL 20 MG PO TABS
20 | Freq: Four times a day (QID) | ORAL | Status: DC | PRN
Start: 2023-10-11 — End: 2023-10-14
  Administered 2023-10-14: 01:00:00 20 mg via ORAL

## 2023-10-11 MED ORDER — HYDROMORPHONE HCL PF 1 MG/ML IJ SOLN
1 | Freq: Four times a day (QID) | INTRAMUSCULAR | Status: DC | PRN
Start: 2023-10-11 — End: 2023-10-11

## 2023-10-11 MED ORDER — ACETAMINOPHEN 160 MG/5ML PO SOLN
160 | Freq: Four times a day (QID) | ORAL | Status: DC
Start: 2023-10-11 — End: 2023-10-14
  Administered 2023-10-11 – 2023-10-13 (×7): 650 mg via ORAL

## 2023-10-11 MED ORDER — LORAZEPAM 2 MG/ML IJ SOLN
2 | Freq: Four times a day (QID) | INTRAMUSCULAR | Status: DC | PRN
Start: 2023-10-11 — End: 2023-10-14
  Administered 2023-10-11: 23:00:00 0.5 mg via INTRAVENOUS

## 2023-10-11 MED ORDER — INSULIN GLARGINE 100 UNIT/ML SC SOLN
100 | Freq: Every day | SUBCUTANEOUS | Status: DC
Start: 2023-10-11 — End: 2023-10-14
  Administered 2023-10-11 – 2023-10-14 (×4): 6 [IU] via SUBCUTANEOUS

## 2023-10-11 MED ORDER — NALOXONE HCL 0.4 MG/ML IJ SOLN
0.4 | INTRAMUSCULAR | Status: DC | PRN
Start: 2023-10-11 — End: 2023-10-14

## 2023-10-11 MED ORDER — METOPROLOL TARTRATE 5 MG/5ML IV SOLN
5 | Freq: Three times a day (TID) | INTRAVENOUS | Status: DC
Start: 2023-10-11 — End: 2023-10-12
  Administered 2023-10-11 – 2023-10-12 (×3): 5 mg via INTRAVENOUS

## 2023-10-11 MED ORDER — HYDROMORPHONE HCL 2 MG PO TABS
2 | Freq: Four times a day (QID) | ORAL | Status: DC | PRN
Start: 2023-10-11 — End: 2023-10-11

## 2023-10-11 MED ORDER — LORAZEPAM 2 MG/ML IJ SOLN
2 | Freq: Four times a day (QID) | INTRAMUSCULAR | Status: DC | PRN
Start: 2023-10-11 — End: 2023-10-11

## 2023-10-11 MED ORDER — CLONIDINE HCL 0.1 MG PO TABS
0.1 | Freq: Four times a day (QID) | ORAL | Status: DC | PRN
Start: 2023-10-11 — End: 2023-10-14

## 2023-10-11 MED ORDER — INFUVITE ADULT IV SOLN
5 | INTRAVENOUS | Status: AC
Start: 2023-10-11 — End: 2023-10-12
  Administered 2023-10-11: via INTRAVENOUS

## 2023-10-11 MED ORDER — METHADONE HCL 10 MG/ML PO CONC
10 | ORAL | Status: DC
Start: 2023-10-11 — End: 2023-10-12
  Administered 2023-10-11: 20:00:00 15 mg via ORAL

## 2023-10-11 MED ORDER — BUPRENORPHINE HCL 2 MG SL SUBL
2 | SUBLINGUAL | Status: DC | PRN
Start: 2023-10-11 — End: 2023-10-11

## 2023-10-11 MED ORDER — CLONIDINE 0.3 MG/24HR TD PTWK
0.3 | TRANSDERMAL | Status: DC
Start: 2023-10-11 — End: 2023-10-14
  Administered 2023-10-11: 17:00:00 1 via TRANSDERMAL

## 2023-10-11 MED ORDER — METOPROLOL TARTRATE 25 MG PO TABS
25 | Freq: Two times a day (BID) | ORAL | Status: DC
Start: 2023-10-11 — End: 2023-10-11

## 2023-10-11 MED FILL — LABETALOL HCL 5 MG/ML IV SOLN: 5 MG/ML | INTRAVENOUS | Qty: 4

## 2023-10-11 MED FILL — CLOPIDOGREL BISULFATE 75 MG PO TABS: 75 MG | ORAL | Qty: 1

## 2023-10-11 MED FILL — LANTUS 100 UNIT/ML SC SOLN: 100 UNIT/ML | SUBCUTANEOUS | Qty: 6

## 2023-10-11 MED FILL — HYDROMORPHONE HCL 1 MG/ML IJ SOLN: 1 MG/ML | INTRAMUSCULAR | Qty: 1

## 2023-10-11 MED FILL — ACETAMINOPHEN 650 MG/20.3ML PO SOLN: 650 MG/20.3ML | ORAL | Qty: 20.3

## 2023-10-11 MED FILL — CLINIMIX E/DEXTROSE (5/15) 5 % IV SOLN: 5 % | INTRAVENOUS | Qty: 2000

## 2023-10-11 MED FILL — DULOXETINE HCL 30 MG PO CPEP: 30 MG | ORAL | Qty: 2

## 2023-10-11 MED FILL — LINZESS 145 MCG PO CAPS: 145 MCG | ORAL | Qty: 2

## 2023-10-11 MED FILL — PANTOPRAZOLE SODIUM 40 MG IV SOLR: 40 MG | INTRAVENOUS | Qty: 40

## 2023-10-11 MED FILL — BUSPIRONE HCL 15 MG PO TABS: 15 MG | ORAL | Qty: 1

## 2023-10-11 MED FILL — CLONIDINE 0.3 MG/24HR TD PTWK: 0.3 MG/24HR | TRANSDERMAL | Qty: 1

## 2023-10-11 MED FILL — METOPROLOL TARTRATE 5 MG/5ML IV SOLN: 5 MG/ML | INTRAVENOUS | Qty: 5

## 2023-10-11 MED FILL — METHADONE HCL 10 MG/ML PO CONC: 10 MG/ML | ORAL | Qty: 5

## 2023-10-11 MED FILL — METOCLOPRAMIDE HCL 5 MG/ML IJ SOLN: 5 MG/ML | INTRAMUSCULAR | Qty: 2

## 2023-10-11 MED FILL — ONDANSETRON 4 MG PO TBDP: 4 MG | ORAL | Qty: 1

## 2023-10-11 MED FILL — LORAZEPAM 2 MG/ML IJ SOLN: 2 MG/ML | INTRAMUSCULAR | Qty: 1

## 2023-10-11 MED FILL — ENOXAPARIN SODIUM 40 MG/0.4ML IJ SOSY: 40 MG/0.4ML | INTRAMUSCULAR | Qty: 0.4

## 2023-10-11 MED FILL — PROMETHAZINE HCL 25 MG/ML IJ SOLN: 25 MG/ML | INTRAMUSCULAR | Qty: 1

## 2023-10-11 MED FILL — METOPROLOL TARTRATE 25 MG PO TABS: 25 MG | ORAL | Qty: 1

## 2023-10-11 MED FILL — ASPIRIN LOW DOSE 81 MG PO CHEW: 81 MG | ORAL | Qty: 1

## 2023-10-11 MED FILL — INSULIN LISPRO 100 UNIT/ML IJ SOLN: 100 UNIT/ML | INTRAMUSCULAR | Qty: 2

## 2023-10-11 MED FILL — POTASSIUM CHLORIDE ER 10 MEQ PO TBCR: 10 MEQ | ORAL | Qty: 4

## 2023-10-11 NOTE — Consults (Signed)
 Session ID: [id number]  Language: Spanish  Interpreter ID: #[id number]  Interpreter Name: Allie Area

## 2023-10-11 NOTE — Progress Notes (Signed)
 Cr down to 1.8, near baseline  Following loosely

## 2023-10-11 NOTE — Progress Notes (Signed)
 [facility] [facility]  [address] [place], [place] [address]  [phone]        Hospitalist Progress Note      NAME: Melinda [name]   DOB:  May 27, 1981  MRM:  [id number]    Date/Time of service: 10/11/2023  2:36 PM       Subjective:     Chief Complaint:  Patient was personally seen and examined by me during this time period.  Chart reviewed.     Pt is currently NPO except  for sips and chips. Continues to have issues with nausea/vomiting and pain. Denies prior h/o substance abuse. Did discuss possibility that pt could be dependent on IV pain meds       Objective:       Vitals:       Last 24hrs VS reviewed since prior progress note. Most recent are:    Vitals:    10/11/23 1248   BP: (!) 165/96   Pulse: (!) 107   Resp: 18   Temp: 98.1 F (36.7 C)   SpO2: 97%     SpO2 Readings from Last 6 Encounters:   10/11/23 97%   09/22/23 98%   09/14/23 100%   08/31/23 100%   07/19/23 100%   06/17/23 100%          Intake/Output Summary (Last 24 hours) at 10/11/2023 1436  Last data filed at 10/11/2023 0559  Gross per 24 hour   Intake 581.9 ml   Output --   Net 581.9 ml            Exam:     Physical Exam:    Gen: unwell appearing   HEENT:  Pink conjunctivae, PERRL, hearing intact to voice, moist mucous membranes  Neck:  Supple, without masses, thyroid non-tender  Resp:  No accessory muscle use, clear breath sounds without wheezes rales or rhonchi  Card:  No murmurs, normal S1, S2 without thrills   Abd: epigastric ab tenderness with diaphoresis and overt emesis during exam   Musc:  No cyanosis or clubbing, L arm swelling  Skin:  No rashes   Neuro:  Cranial nerves 3-12 are grossly intact, follows commands appropriately  Psych:  a0x3    Medications Reviewed: (see below)    Lab Data Reviewed: (see below)    ______________________________________________________________________    Medications:     Current Facility-Administered Medications   Medication Dose Route Frequency    metoprolol  tartrate  (LOPRESSOR ) tablet 25 mg  25 mg Oral BID    cloNIDine  (CATAPRES ) 0.3 MG/24HR 1 patch  1 patch TransDERmal Weekly    cloNIDine  (CATAPRES ) tablet 0.1 mg  0.1 mg Oral Q6H PRN    dicyclomine  (BENTYL ) tablet 20 mg  20 mg Oral Q6H PRN    naloxone  (NARCAN ) injection 0.4 mg  0.4 mg IntraVENous PRN    buprenorphine (SUBUTEX) SL tablet 4 mg  4 mg SubLINGual PRN    PN-Adult Premix 5/15 - Standard Electrolytes - Central Line   IntraVENous Continuous TPN    PN-Adult Premix  4.25/10 - Standard Electrolytes - Central Line   IntraVENous Continuous TPN    insulin  lispro (HUMALOG ,ADMELOG ) injection vial 0-8 Units  0-8 Units SubCUTAneous Q6H    metoclopramide  (REGLAN ) injection 5 mg  5 mg IntraVENous Q6H    promethazine  (PHENERGAN ) 25 mg in sodium chloride  0.9 % 50 mL IVPB  25 mg IntraVENous Q6H PRN    enoxaparin  (LOVENOX ) injection 40 mg  40 mg SubCUTAneous Daily  potassium chloride  (KLOR-CON ) extended release tablet 40 mEq  40 mEq Oral Daily    ondansetron  (ZOFRAN -ODT) disintegrating tablet 4 mg  4 mg Oral Q8H PRN    sodium chloride  flush 0.9 % injection 5-40 mL  5-40 mL IntraVENous 2 times per day    sodium chloride  flush 0.9 % injection 5-40 mL  5-40 mL IntraVENous PRN    0.9 % sodium chloride  infusion   IntraVENous PRN    clopidogrel  (PLAVIX ) tablet 75 mg  75 mg Oral Daily    labetalol  (NORMODYNE ;TRANDATE ) injection 20 mg  20 mg IntraVENous Q4H PRN    DULoxetine  (CYMBALTA ) extended release capsule 60 mg  60 mg Oral Daily    aspirin  chewable tablet 81 mg  81 mg Oral Daily    hydrALAZINE  (APRESOLINE ) injection 20 mg  20 mg IntraVENous Q6H PRN    busPIRone  (BUSPAR ) tablet 15 mg  15 mg Oral TID    hydrOXYzine  HCl (ATARAX ) tablet 50 mg  50 mg Oral TID PRN    linaclotide  (LINZESS ) capsule 290 mcg  290 mcg Oral QAM AC    sodium chloride  flush 0.9 % injection 5-40 mL  5-40 mL IntraVENous 2 times per day    sodium chloride  flush 0.9 % injection 5-40 mL  5-40 mL IntraVENous PRN    0.9 % sodium chloride  infusion   IntraVENous PRN     acetaminophen  (TYLENOL ) tablet 650 mg  650 mg Oral Q6H PRN    Or    acetaminophen  (TYLENOL ) suppository 650 mg  650 mg Rectal Q6H PRN    glucose chewable tablet 16 g  4 tablet Oral PRN    dextrose  bolus 10% 125 mL  125 mL IntraVENous PRN    Or    dextrose  bolus 10% 250 mL  250 mL IntraVENous PRN    glucagon  injection 1 mg  1 mg SubCUTAneous PRN    dextrose  10 % infusion   IntraVENous Continuous PRN    pantoprazole  (PROTONIX ) 40 mg in sodium chloride  (PF) 0.9 % 10 mL injection  40 mg IntraVENous Q12H          Lab Review:     Recent Labs     10/09/23  0829 10/10/23  0837 10/11/23  0548   WBC 8.1 9.3 5.7   HGB 9.3* 9.4* INVESTIGATED PER DELTA CHECK PROTOCOL   HCT 27.9* 28.6* 36.9   PLT 528* 502* 415*     Recent Labs     10/09/23  0829 10/10/23  0837 10/11/23  0548   NA 133* 137 137   K 4.4 4.1 3.5   CL 99 103 102   CO2 30 28 29    BUN 10 10 9    MG  --  1.6 1.7   PHOS  --   --  4.1     No results found for: "GLUCPOC"    EGD =>   Esophagus-normal-appearing esophagus and normal-appearing gastroesophageal junction  Stomach-mild diffuse erythema throughout the stomach with sitting biopsy protocols mucosal biopsies obtained from the gastric body, angularis and gastric antrum in separate containers  Duodenum-healthy-appearing to the D3 segment with cold force mucosal biopsies obtained     Assessment / Plan:   42 yo Hx of HTN, DM, CKD 3, gastroparesis, cholecystectomy, presented w/ recurrent abd pain, found to have acute pancreatitis, severe distal SMA stenosis.  S/p vascular stenting on 12/23.  Complicated by AKI    1) AKI on CKD III - with progression. ?CIN ?IVVD considering intermittent and poor PO intake   -  receiving fluids in TPN  -strict I's and O's   -hold nephrotoxic meds   -stop lasix     2) Intractable abd pain/N/V: improves when NPO or on clears but worsens with advancement in diet. This has recurred multiple times during this hospitalization. ?a/c pancreatitis. Severe SMA stenosis s/p stenting on 12/23 without  improvement in symptoms. EGD from 12/11 with gastritis. Prior gastric emptying study NEGATIVE for gastroparesis   -continue IV PPI BID   -will place pt back on NPO   -pancreatic fecal elastase 190   -urine porphobilinogen WNL; will repeat in acute setting   -GI on board; ?SBFT   -TPN started; eval for refeeding   -may need transfer to tertiary care center     3) Severe distal SMA stenosis: seen on abd CTA.    -vascular performed angio/SMA stenting on 12/23.    -cont ASA, plavix    -d/w vascular; they do NOT think that repeat imaging would be useful; low suspicion that symptoms from stent failure (discussed on 12/30)     4) Acute pancreatitis: most recent CT showed resolution but symptoms persisting  -will need TPN; unable to have peripheral line placed due to poor vasculature; central line placement     5) Anasarca: due to IVF, low albumin , pancreatitis.    -see management above   -prealbumin low which is suspected   -does have overt proteinuria on UA which is likely contributing; resume ARB once Cr stable     6) HTN: variable intake   -start clonidine  2/2 N/V; uptitrate  -PRN hydralazine      7) DM type 2: A1C 7.4%  -ISS   -BG checks AC TID and qHS   -start Lantus      8) Anemia: iron  studies consistent with ACD  -monitor     9) Mood disorder:   -on buspar , cymbalta      10) L arm swelling: likely from IVF, anasarca.  Dopplers negative for DVT   -monitor     11) Substance dependence - do have concerns for malingering v. Dependence v. Now withdrawals between doses of IV pain meds. For nearly two weeks has had IV pain meds nearly around the clock   -start COWS scale for ?opiate withdrawal  -start low dose methadone  in attempt to stop IV dilaudid ; will give 15mg  today, 15mg  on 1/1 and then stop     **Prior records, notes, labs, radiology, and medications reviewed in [facility]**    Total time spent with patient care: 47 Minutes **I personally saw and examined the patient during this time period**                 Care  Plan discussed with: Patient, nursing, pharmacy     Discussed:  Care Plan    Prophylaxis:  Lovenox     Disposition:  Home w/Family           ___________________________________________________    Attending Physician: Doria Garden, MD

## 2023-10-11 NOTE — Plan of Care (Signed)
 Problem: Chronic Conditions and Co-morbidities  Goal: Patient's chronic conditions and co-morbidity symptoms are monitored and maintained or improved  10/11/2023 0128 by Collins Dean, RN  Outcome: Progressing     Problem: Pain  Goal: Verbalizes/displays adequate comfort level or baseline comfort level  10/11/2023 0128 by Collins Dean, RN  Outcome: Progressing  Flowsheets (Taken 10/10/2023 1903)  Verbalizes/displays adequate comfort level or baseline comfort level:   Encourage patient to monitor pain and request assistance   Assess pain using appropriate pain scale   Administer analgesics based on type and severity of pain and evaluate response   Implement non-pharmacological measures as appropriate and evaluate response   Consider cultural and social influences on pain and pain management   Notify Licensed Independent Practitioner if interventions unsuccessful or patient reports new pain     Problem: Safety - Adult  Goal: Free from fall injury  10/11/2023 0128 by Collins Dean, RN  Outcome: Progressing     Problem: Nutrition Deficit:  Goal: Optimize nutritional status  10/11/2023 0128 by Collins Dean, RN  Outcome: Progressing     Problem: Neurosensory - Adult  Goal: Achieves stable or improved neurological status  10/11/2023 0128 by Collins Dean, RN  Outcome: Progressing     Problem: Neurosensory - Adult  Goal: Absence of seizures  10/11/2023 0128 by Collins Dean, RN  Outcome: Progressing     Problem: Neurosensory - Adult  Goal: Remains free of injury related to seizures activity  10/11/2023 0128 by Collins Dean, RN  Outcome: Progressing     Problem: Respiratory - Adult  Goal: Achieves optimal ventilation and oxygenation  10/11/2023 0128 by Collins Dean, RN  Outcome: Progressing     Problem: Cardiovascular - Adult  Goal: Maintains optimal cardiac output and hemodynamic stability  10/11/2023 0128 by Collins Dean, RN  Outcome: Progressing     Problem: Cardiovascular - Adult  Goal: Absence  of cardiac dysrhythmias or at baseline  10/11/2023 0128 by Collins Dean, RN  Outcome: Progressing     Problem: Skin/Tissue Integrity - Adult  Goal: Skin integrity remains intact  10/11/2023 0128 by Collins Dean, RN  Outcome: Progressing  Flowsheets (Taken 10/10/2023 2000)  Skin Integrity Remains Intact: Monitor for areas of redness and/or skin breakdown     Problem: Skin/Tissue Integrity - Adult  Goal: Incisions, wounds, or drain sites healing without S/S of infection  10/11/2023 0128 by Collins Dean, RN  Outcome: Progressing     Problem: Skin/Tissue Integrity - Adult  Goal: Oral mucous membranes remain intact  10/11/2023 0128 by Collins Dean, RN  Outcome: Progressing     Problem: Musculoskeletal - Adult  Goal: Return mobility to safest level of function  10/11/2023 0128 by Collins Dean, RN  Outcome: Progressing     Problem: Musculoskeletal - Adult  Goal: Maintain proper alignment of affected body part  10/11/2023 0128 by Collins Dean, RN  Outcome: Progressing     Problem: Musculoskeletal - Adult  Goal: Return ADL status to a safe level of function  10/11/2023 0128 by Collins Dean, RN  Outcome: Progressing     Problem: Gastrointestinal - Adult  Goal: Minimal or absence of nausea and vomiting  10/11/2023 0128 by Collins Dean, RN  Outcome: Progressing     Problem: Gastrointestinal - Adult  Goal: Maintains or returns to baseline bowel function  10/11/2023 0128 by Collins Dean, RN  Outcome: Progressing     Problem: Gastrointestinal - Adult  Goal: Maintains adequate nutritional intake  10/11/2023 0128 by Collins Dean, RN  Outcome:  Progressing     Problem: Gastrointestinal - Adult  Goal: Establish and maintain optimal ostomy function  10/11/2023 0128 by Collins Dean, RN  Outcome: Progressing     Problem: Genitourinary - Adult  Goal: Absence of urinary retention  10/11/2023 0128 by Collins Dean, RN  Outcome: Progressing     Problem: Genitourinary - Adult  Goal: Urinary catheter remains  patent  10/11/2023 0128 by Collins Dean, RN  Outcome: Progressing     Problem: Infection - Adult  Goal: Absence of infection at discharge  10/11/2023 0128 by Collins Dean, RN  Outcome: Progressing     Problem: Infection - Adult  Goal: Absence of infection during hospitalization  10/11/2023 0128 by Collins Dean, RN  Outcome: Progressing     Problem: Infection - Adult  Goal: Absence of fever/infection during anticipated neutropenic period  10/11/2023 0128 by Collins Dean, RN  Outcome: Progressing     Problem: Metabolic/Fluid and Electrolytes - Adult  Goal: Electrolytes maintained within normal limits  10/11/2023 0128 by Collins Dean, RN  Outcome: Progressing     Problem: Metabolic/Fluid and Electrolytes - Adult  Goal: Hemodynamic stability and optimal renal function maintained  10/11/2023 0128 by Collins Dean, RN  Outcome: Progressing     Problem: Metabolic/Fluid and Electrolytes - Adult  Goal: Glucose maintained within prescribed range  10/11/2023 0128 by Collins Dean, RN  Outcome: Progressing     Problem: Hematologic - Adult  Goal: Maintains hematologic stability  10/11/2023 0128 by Collins Dean, RN  Outcome: Progressing

## 2023-10-11 NOTE — Progress Notes (Signed)
 Spiritual Health History and Assessment/Progress Note  [facility]    Rituals, Rites and Sacraments,  ,  ,      Name: Melinda [name] MRN: [id number]    Age: 42 y.o.     Sex: female   Language: Spanish   Religion: Catholic   Intractable nausea and vomiting     Date: 10/11/2023            Total Time Calculated: 5 min              Spiritual Assessment continued in [facility] MULTI-SPECIALTY ONCOLOGY 1        Referral/Consult From: Clergy/Religious Leader   Encounter Overview/Reason: Rituals, Rites and Research scientist (life sciences) Provided For: Patient    Faith, Belief, Meaning:   Patient is connected with a faith tradition or spiritual practice  Family/Friends No family/friends present      Importance and Influence:  Patient has spiritual/personal beliefs that influence decisions regarding their health  Family/Friends No family/friends present    Community:  Patient is connected with a spiritual community  Family/Friends No family/friends present    Assessment and Plan of Care:     Patient Interventions include: Provided sacramental/religious ritual  Family/Friends Interventions include: No family/friends present    Patient Plan of Care: Spiritual Care available upon further referral  Family/Friends Plan of Care: Spiritual Care available upon further referral    Electronically signed by Nanetta Baars, Chaplain on 10/11/2023 at 2:15 PM     Eucharistic ministry visit attempted.  Mrs. Tot-Ambros was asleep.  Prayer for spiritual communion offered at bedside.    Chaplain Sr. Nanetta Baars, SBS, RN, ACSW, LCSW  Chaplain Page:  [phone])

## 2023-10-11 NOTE — Plan of Care (Signed)
 Problem: Chronic Conditions and Co-morbidities  Goal: Patient's chronic conditions and co-morbidity symptoms are monitored and maintained or improved  Outcome: Progressing  Flowsheets (Taken 10/11/2023 1947)  Care Plan - Patient's Chronic Conditions and Co-Morbidity Symptoms are Monitored and Maintained or Improved:   Monitor and assess patient's chronic conditions and comorbid symptoms for stability, deterioration, or improvement   Collaborate with multidisciplinary team to address chronic and comorbid conditions and prevent exacerbation or deterioration   Update acute care plan with appropriate goals if chronic or comorbid symptoms are exacerbated and prevent overall improvement and discharge     Problem: Pain  Goal: Verbalizes/displays adequate comfort level or baseline comfort level  Outcome: Progressing     Problem: Safety - Adult  Goal: Free from fall injury  Outcome: Progressing     Problem: Nutrition Deficit:  Goal: Optimize nutritional status  Outcome: Progressing     Problem: Neurosensory - Adult  Goal: Achieves stable or improved neurological status  Outcome: Progressing  Flowsheets (Taken 10/11/2023 1947)  Achieves stable or improved neurological status: Assess for and report changes in neurological status  Goal: Absence of seizures  Outcome: Progressing  Flowsheets (Taken 10/11/2023 1947)  Absence of seizures: Monitor for seizure activity.  If seizure occurs, document type and location of movements and any associated apnea  Goal: Remains free of injury related to seizures activity  Outcome: Progressing  Flowsheets (Taken 10/11/2023 1947)  Remains free of injury related to seizure activity: Maintain airway, patient safety  and administer oxygen as ordered     Problem: Respiratory - Adult  Goal: Achieves optimal ventilation and oxygenation  Outcome: Progressing     Problem: Cardiovascular - Adult  Goal: Maintains optimal cardiac output and hemodynamic stability  Outcome: Progressing  Flowsheets (Taken  10/11/2023 1947)  Maintains optimal cardiac output and hemodynamic stability:   Monitor blood pressure and heart rate   Monitor urine output and notify Licensed Independent Practitioner for values outside of normal range   Assess for signs of decreased cardiac output   Administer fluid and/or volume expanders as ordered   Administer vasoactive medications as ordered  Goal: Absence of cardiac dysrhythmias or at baseline  Outcome: Progressing  Flowsheets (Taken 10/11/2023 1947)  Absence of cardiac dysrhythmias or at baseline:   Monitor cardiac rate and rhythm   Assess for signs of decreased cardiac output   Administer antiarrhythmia medication and electrolyte replacement as ordered     Problem: Skin/Tissue Integrity - Adult  Goal: Skin integrity remains intact  Outcome: Progressing  Flowsheets (Taken 10/11/2023 1947)  Skin Integrity Remains Intact: Monitor for areas of redness and/or skin breakdown  Goal: Incisions, wounds, or drain sites healing without S/S of infection  Outcome: Progressing  Flowsheets (Taken 10/11/2023 1947)  Incisions, Wounds, or Drain Sites Healing Without Sign and Symptoms of Infection:   ADMISSION and DAILY: Assess and document risk factors for pressure ulcer development   TWICE DAILY: Assess and document skin integrity   TWICE DAILY: Assess and document dressing/incision, wound bed, drain sites and surrounding tissue  Goal: Oral mucous membranes remain intact  Outcome: Progressing  Flowsheets (Taken 10/11/2023 1947)  Oral Mucous Membranes Remain Intact: Assess oral mucosa and hygiene practices     Problem: Musculoskeletal - Adult  Goal: Return mobility to safest level of function  Outcome: Progressing  Goal: Maintain proper alignment of affected body part  Outcome: Progressing  Goal: Return ADL status to a safe level of function  Outcome: Progressing     Problem: Gastrointestinal - Adult  Goal: Minimal or absence of nausea and vomiting  Outcome: Progressing  Flowsheets (Taken 10/11/2023  1947)  Minimal or absence of nausea and vomiting:   Administer IV fluids as ordered to ensure adequate hydration   Maintain NPO status until nausea and vomiting are resolved   Administer ordered antiemetic medications as needed   Provide nonpharmacologic comfort measures as appropriate  Goal: Maintains or returns to baseline bowel function  Outcome: Progressing  Flowsheets (Taken 10/11/2023 1947)  Maintains or returns to baseline bowel function:   Administer ordered medications as needed   Encourage mobilization and activity   Assess bowel function   Encourage oral fluids to ensure adequate hydration  Goal: Maintains adequate nutritional intake  Outcome: Progressing  Flowsheets (Taken 10/11/2023 1947)  Maintains adequate nutritional intake:   Monitor percentage of each meal consumed   Identify factors contributing to decreased intake, treat as appropriate   Assist with meals as needed   Monitor intake and output, weight and lab values   Obtain nutritional consult as needed  Goal: Establish and maintain optimal ostomy function  Outcome: Progressing  Flowsheets (Taken 10/11/2023 1947)  Establish and maintain optimal ostomy function: Administer IV fluids and TPN as ordered     Problem: Genitourinary - Adult  Goal: Absence of urinary retention  Outcome: Progressing  Flowsheets (Taken 10/11/2023 1947)  Absence of urinary retention:   Assess patient's ability to void and empty bladder   Monitor intake/output and perform bladder scan as needed  Goal: Urinary catheter remains patent  Outcome: Progressing     Problem: Infection - Adult  Goal: Absence of infection at discharge  Outcome: Progressing  Flowsheets (Taken 10/11/2023 1947)  Absence of infection at discharge:   Assess and monitor for signs and symptoms of infection   Monitor lab/diagnostic results   Monitor all insertion sites i.e., indwelling lines, tubes and drains   Institute appropriate cooling/warming therapies per order   Administer medications as ordered    Instruct and encourage patient and family to use good hand hygiene technique   Identify and instruct in appropriate isolation precautions for identified infection/condition  Goal: Absence of infection during hospitalization  Outcome: Progressing  Flowsheets (Taken 10/11/2023 1947)  Absence of infection during hospitalization:   Assess and monitor for signs and symptoms of infection   Monitor lab/diagnostic results   Monitor all insertion sites i.e., indwelling lines, tubes and drains   Institute appropriate cooling/warming therapies per order   Administer medications as ordered   Instruct and encourage patient and family to use good hand hygiene technique   Identify and instruct in appropriate isolation precautions for identified infection/condition  Goal: Absence of fever/infection during anticipated neutropenic period  Outcome: Progressing  Flowsheets (Taken 10/11/2023 1947)  Absence of fever/infection during anticipated neutropenic period: Monitor white blood cell count     Problem: Metabolic/Fluid and Electrolytes - Adult  Goal: Electrolytes maintained within normal limits  Outcome: Progressing  Flowsheets (Taken 10/11/2023 1947)  Electrolytes maintained within normal limits:   Monitor labs and assess patient for signs and symptoms of electrolyte imbalances   Administer electrolyte replacement as ordered   Monitor response to electrolyte replacements, including repeat lab results as appropriate   Fluid restriction as ordered   Instruct patient on fluid and nutrition restrictions as appropriate  Goal: Hemodynamic stability and optimal renal function maintained  Outcome: Progressing  Flowsheets (Taken 10/11/2023 1947)  Hemodynamic stability and optimal renal function maintained:   Monitor labs and assess for signs and symptoms of volume excess  or deficit   Monitor intake, output and patient weight   Monitor urine specific gravity, serum osmolarity and serum sodium as indicated or ordered   Monitor response to  interventions for patient's volume status, including labs, urine output, blood pressure (other measures as available)   Encourage oral intake as appropriate   Instruct patient on fluid and nutrition restrictions as appropriate  Goal: Glucose maintained within prescribed range  Outcome: Progressing  Flowsheets (Taken 10/11/2023 1947)  Glucose maintained within prescribed range:   Monitor blood glucose as ordered   Assess for signs and symptoms of hyperglycemia and hypoglycemia   Assess barriers to adequate nutritional intake and initiate nutrition consult as needed   Administer ordered medications to maintain glucose within target range   Instruct patient on self management of diabetes and initiate consult as needed     Problem: Hematologic - Adult  Goal: Maintains hematologic stability  Outcome: Progressing  Flowsheets (Taken 10/11/2023 1947)  Maintains hematologic stability:   Assess for signs and symptoms of bleeding or hemorrhage   Monitor labs for bleeding or clotting disorders   Administer blood products/factors as ordered

## 2023-10-11 NOTE — Progress Notes (Signed)
 Brief Nutrition Assessment    Type and Reason for Visit: Reassess    Nutrition Recommendations/Plan:   Brief follow up.     Pt had Central line placed 12/30 - change PN recs to TPN.     Can start with Day 2 TPN:    TPN as follows:       Day 1: was PPN @ 42 mL/hr       Day 2: central line obtained - start TPN, advance rate: 1512 mL, 225 g carbs, 76 g protein  (D15/AA5 @ 63 mL/hour) - GOAL rate         Lipids: Check triglycerides and if <400, add lipids per below  -Lipids 20%, 500 mL @ 42 mL/hr x 12 hrs, 3 x per week.               - Monitor K, Phos, Mg until stable x 3 days with nutrition, replace PRN  - Hold TPN at current rate if electrolytes are not stable      Goal rate of 75 mL/hr provides: 1502 kcal on average per day meeting 96% of estimated needs.       Estimated Nutrition Needs:   Energy Requirements Based On: Formula  Weight Used for Energy Requirements: Current  Energy (kcal/day): 1551 (MSJX1.3)  Weight Used for Protein Requirements: Current  Protein (g/day): 73  Method Used for Fluid Requirements: 1 ml/kcal  Fluid (ml/day): 1550 mL      Electronically signed by Seretha Dance, RD  Contact: Ext: [phone], or via [employer]

## 2023-10-11 NOTE — Progress Notes (Signed)
 [place] [facility] [place], [place]  [phone]           GI PROGRESS NOTE        NAME: Melinda [name]   DOB:  [birth year]   MRN:  [id number]       Follow Up 10/11/23    Patient is oriented, but somnolent on exam.  Started on TPN today.  Nausea and abdominal pain similar to yesterday.  Had a BM early this morning.        VITALS:   Last 24hrs VS reviewed since prior progress note. Most recent are:  Vitals:    10/11/23 1248   BP: (!) 165/96   Pulse: (!) 107   Resp: 18   Temp: 98.1 F (36.7 C)   SpO2: 97%       Intake/Output Summary (Last 24 hours) at 10/11/2023 1414  Last data filed at 10/11/2023 0559  Gross per 24 hour   Intake 581.9 ml   Output --   Net 581.9 ml       PHYSICAL EXAM:  General: Somnolent, ill-appearing  HEENT: Anicteric sclerae.  Lungs:            CTA Bilaterally.   Heart:  Regular  rhythm,    Abdomen: Soft, Non distended, generalized tenderness (+)Bowel sounds, no HSM  Extremities: No c/c/e  Neurologic:  CN 2-12 gi, Alert and oriented X 3.  No acute neurological distress   Psych:   Not anxious nor agitated.    Lab Data Reviewed:   Recent Labs     10/10/23  0837 10/11/23  0548   WBC 9.3 5.7   HGB 9.4* INVESTIGATED PER DELTA CHECK PROTOCOL   HCT 28.6* 36.9   PLT 502* 415*     Recent Labs     10/10/23  0837 10/11/23  0548   NA 137 137   K 4.1 3.5   CL 103 102   CO2 28 29   BUN 10 9   PHOS  --  4.1     No results for input(s): "TP", "GLOB", "GGT" in the last 72 hours.    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 S/p vascular stenting on 12/23. Complicated by AKI     Intractable nausea/vomiting/abdominal pain  -No clear obstructive pathology identified on prior EGD, CT, MRI and KUB thus far. Normal GES 02/2023  -C1Q complement protein ordered  -Porphyria studies negative  -Continue Linzess  - caution with opioids due to worsening constipation  -Still awaiting UGIS with SBFT to look for distal etiology  -Continue Reglan   -Continue TPN   -Per vascular, CTA will likely not be of any value  -Pending results of UGI S and given nonspecific findings of pancreatic head edematous change in  this patient with ongoing symptoms, would be reasonable to consider EUS sometime later this week.    GI following     Signed By: Glorietta Lark, PA-C     10/11/2023  2:14 PM

## 2023-10-12 LAB — CBC WITH AUTO DIFFERENTIAL
Basophils %: 1 % (ref 0–1)
Basophils Absolute: 0 10*3/uL (ref 0.0–0.1)
Eosinophils %: 6 % (ref 0–7)
Eosinophils Absolute: 0.4 10*3/uL (ref 0.0–0.4)
Hematocrit: 22.1 % — ABNORMAL LOW (ref 35.0–47.0)
Hemoglobin: 7.1 g/dL — ABNORMAL LOW (ref 11.5–16.0)
Immature Granulocytes %: 0 % (ref 0.0–0.5)
Immature Granulocytes Absolute: 0 10*3/uL (ref 0.00–0.04)
Lymphocytes %: 37 % (ref 12–49)
Lymphocytes Absolute: 2.3 10*3/uL (ref 0.8–3.5)
MCH: 28.6 pg (ref 26.0–34.0)
MCHC: 32.1 g/dL (ref 30.0–36.5)
MCV: 89.1 fL (ref 80.0–99.0)
MPV: 8.6 fL — ABNORMAL LOW (ref 8.9–12.9)
Monocytes %: 11 % (ref 5–13)
Monocytes Absolute: 0.7 10*3/uL (ref 0.0–1.0)
Neutrophils %: 45 % (ref 32–75)
Neutrophils Absolute: 2.8 10*3/uL (ref 1.8–8.0)
Nucleated RBCs: 0 /100{WBCs}
Platelets: 428 10*3/uL — ABNORMAL HIGH (ref 150–400)
RBC: 2.48 M/uL — ABNORMAL LOW (ref 3.80–5.20)
RDW: 13.6 % (ref 11.5–14.5)
WBC: 6.2 10*3/uL (ref 3.6–11.0)
nRBC: 0 10*3/uL (ref 0.00–0.01)

## 2023-10-12 LAB — MAGNESIUM: Magnesium: 1.8 mg/dL (ref 1.6–2.4)

## 2023-10-12 LAB — POCT GLUCOSE
POC Glucose: 151 mg/dL — ABNORMAL HIGH (ref 65–117)
POC Glucose: 185 mg/dL — ABNORMAL HIGH (ref 65–117)
POC Glucose: 161 mg/dL — ABNORMAL HIGH (ref 65–117)
POC Glucose: 148 mg/dL — ABNORMAL HIGH (ref 65–117)

## 2023-10-12 LAB — BASIC METABOLIC PANEL
Anion Gap: 4 mmol/L (ref 2–12)
BUN/Creatinine Ratio: 9 — ABNORMAL LOW (ref 12–20)
BUN: 17 mg/dL (ref 6–20)
CO2: 30 mmol/L (ref 21–32)
Calcium: 8 mg/dL — ABNORMAL LOW (ref 8.5–10.1)
Chloride: 101 mmol/L (ref 97–108)
Creatinine: 1.93 mg/dL — ABNORMAL HIGH (ref 0.55–1.02)
Est, Glom Filt Rate: 33 mL/min/{1.73_m2} — ABNORMAL LOW (ref >60)
Glucose: 144 mg/dL — ABNORMAL HIGH (ref 65–100)
Potassium: 4.3 mmol/L (ref 3.5–5.1)
Sodium: 135 mmol/L — ABNORMAL LOW (ref 136–145)

## 2023-10-12 LAB — PHOSPHORUS: Phosphorus: 4.8 mg/dL — ABNORMAL HIGH (ref 2.6–4.7)

## 2023-10-12 LAB — TYPE AND SCREEN
ABO/Rh: O POS
Antibody Screen: NEGATIVE

## 2023-10-12 LAB — HEMOGLOBIN: Hemoglobin: 7.9 g/dL — ABNORMAL LOW (ref 11.5–16.0)

## 2023-10-12 LAB — TRIGLYCERIDES: Triglycerides: 155 mg/dL — ABNORMAL HIGH (ref <150)

## 2023-10-12 MED ORDER — METOPROLOL TARTRATE 50 MG PO TABS
50 | Freq: Two times a day (BID) | ORAL | Status: DC
Start: 2023-10-12 — End: 2023-10-14
  Administered 2023-10-13 – 2023-10-14 (×3): 50 mg via ORAL

## 2023-10-12 MED ORDER — SODIUM CHLORIDE 0.9 % IV BOLUS
0.9 | Freq: Once | INTRAVENOUS | Status: AC
Start: 2023-10-12 — End: 2023-10-12
  Administered 2023-10-12: 23:00:00 500 mL via INTRAVENOUS

## 2023-10-12 MED ORDER — POTASSIUM ACETATE 2 MEQ/ML IV SOLN
2 | INTRAVENOUS | Status: AC
Start: 2023-10-12 — End: 2023-10-13
  Administered 2023-10-13: 05:00:00 via INTRAVENOUS

## 2023-10-12 MED ORDER — METHADONE HCL 10 MG/ML PO CONC
10 | Freq: Once | ORAL | Status: AC
Start: 2023-10-12 — End: 2023-10-12
  Administered 2023-10-12: 19:00:00 15 mg via ORAL

## 2023-10-12 MED ORDER — METOCLOPRAMIDE HCL 5 MG/ML IJ SOLN
5 | Freq: Four times a day (QID) | INTRAMUSCULAR | Status: DC | PRN
Start: 2023-10-12 — End: 2023-10-13
  Administered 2023-10-13: 01:00:00 5 mg via INTRAVENOUS

## 2023-10-12 MED ORDER — METOPROLOL TARTRATE 50 MG PO TABS
50 | Freq: Two times a day (BID) | ORAL | Status: DC
Start: 2023-10-12 — End: 2023-10-12

## 2023-10-12 MED ORDER — METOPROLOL TARTRATE 50 MG PO TABS
50 | Freq: Two times a day (BID) | ORAL | Status: DC
Start: 2023-10-12 — End: 2023-10-12
  Administered 2023-10-12: 19:00:00 50 mg via ORAL

## 2023-10-12 MED ORDER — METOPROLOL TARTRATE 25 MG PO TABS
25 | Freq: Once | ORAL | Status: AC
Start: 2023-10-12 — End: 2023-10-12
  Administered 2023-10-12: 21:00:00 25 mg via ORAL

## 2023-10-12 MED FILL — SODIUM CHLORIDE 0.9 % IV SOLN: 0.9 % | INTRAVENOUS | Qty: 500

## 2023-10-12 MED FILL — ENOXAPARIN SODIUM 40 MG/0.4ML IJ SOSY: 40 MG/0.4ML | INTRAMUSCULAR | Qty: 0.4

## 2023-10-12 MED FILL — PANTOPRAZOLE SODIUM 40 MG IV SOLR: 40 MG | INTRAVENOUS | Qty: 40

## 2023-10-12 MED FILL — METOPROLOL TARTRATE 25 MG PO TABS: 25 MG | ORAL | Qty: 1

## 2023-10-12 MED FILL — CLOPIDOGREL BISULFATE 75 MG PO TABS: 75 MG | ORAL | Qty: 1

## 2023-10-12 MED FILL — ACETAMINOPHEN 650 MG/20.3ML PO SOLN: 650 MG/20.3ML | ORAL | Qty: 20.3

## 2023-10-12 MED FILL — ASPIRIN LOW DOSE 81 MG PO CHEW: 81 MG | ORAL | Qty: 1

## 2023-10-12 MED FILL — METOCLOPRAMIDE HCL 5 MG/ML IJ SOLN: 5 MG/ML | INTRAMUSCULAR | Qty: 2

## 2023-10-12 MED FILL — LANTUS 100 UNIT/ML SC SOLN: 100 UNIT/ML | SUBCUTANEOUS | Qty: 6

## 2023-10-12 MED FILL — BUSPIRONE HCL 15 MG PO TABS: 15 MG | ORAL | Qty: 1

## 2023-10-12 MED FILL — HYDRALAZINE HCL 20 MG/ML IJ SOLN: 20 MG/ML | INTRAMUSCULAR | Qty: 1

## 2023-10-12 MED FILL — METOPROLOL TARTRATE 50 MG PO TABS: 50 MG | ORAL | Qty: 1

## 2023-10-12 MED FILL — LINZESS 145 MCG PO CAPS: 145 MCG | ORAL | Qty: 2

## 2023-10-12 MED FILL — METOPROLOL TARTRATE 5 MG/5ML IV SOLN: 5 MG/ML | INTRAVENOUS | Qty: 5

## 2023-10-12 MED FILL — TRAVASOL 10 % IV SOLN: 10 % | INTRAVENOUS | Qty: 760

## 2023-10-12 MED FILL — METHADONE HCL 10 MG/ML PO CONC: 10 MG/ML | ORAL | Qty: 5

## 2023-10-12 MED FILL — INSULIN LISPRO 100 UNIT/ML IJ SOLN: 100 UNIT/ML | INTRAMUSCULAR | Qty: 2

## 2023-10-12 MED FILL — DULOXETINE HCL 30 MG PO CPEP: 30 MG | ORAL | Qty: 2

## 2023-10-12 NOTE — Progress Notes (Signed)
 [place] [facility]  [address] [place], [place] [address]  [phone]        Hospitalist Progress Note      NAME: Melinda [name]   DOB:  [birth year]  MRM:  [id number]    Date/Time of service: 10/12/2023  1:18 PM       Subjective:     Chief Complaint:  Patient was personally seen and examined by me during this time period.  Chart reviewed.     On 12/31 IV dilaudid , PO dilaudid  stopped. COW protocol started and pt given methadone . Seems to be improved today without N/V and mild ab pain. Tolerated some juice earlier per report.        Objective:       Vitals:       Last 24hrs VS reviewed since prior progress note. Most recent are:    Vitals:    10/12/23 1214   BP: (!) 197/106   Pulse: (!) 107   Resp:    Temp:    SpO2:      SpO2 Readings from Last 6 Encounters:   10/12/23 99%   09/22/23 98%   09/14/23 100%   08/31/23 100%   07/19/23 100%   06/17/23 100%          Intake/Output Summary (Last 24 hours) at 10/12/2023 1318  Last data filed at 10/11/2023 2050  Gross per 24 hour   Intake 5 ml   Output --   Net 5 ml            Exam:     Physical Exam:    Gen: NAD   HEENT:  Pink conjunctivae, PERRL, hearing intact to voice, moist mucous membranes  Neck:  Supple, without masses, thyroid non-tender  Resp:  No accessory muscle use, clear breath sounds without wheezes rales or rhonchi  Card:  No murmurs, normal S1, S2 without thrills   Abd: improving epigastric tenderness   Musc:  No cyanosis or clubbing, L arm swelling  Skin:  No rashes   Neuro:  Cranial nerves 3-12 are grossly intact, follows commands appropriately  Psych:  a0x3    Medications Reviewed: (see below)    Lab Data Reviewed: (see below)    ______________________________________________________________________    Medications:     Current Facility-Administered Medications   Medication Dose Route Frequency    PN-Adult 2-in-1 Central Line (Custom)   IntraVENous Continuous TPN    metoprolol  tartrate (LOPRESSOR ) tablet 50 mg  50 mg Oral BID     cloNIDine  (CATAPRES ) 0.3 MG/24HR 1 patch  1 patch TransDERmal Weekly    cloNIDine  (CATAPRES ) tablet 0.1 mg  0.1 mg Oral Q6H PRN    dicyclomine  (BENTYL ) tablet 20 mg  20 mg Oral Q6H PRN    naloxone  (NARCAN ) injection 0.4 mg  0.4 mg IntraVENous PRN    PN-Adult Premix 5/15 - Standard Electrolytes - Central Line   IntraVENous Continuous TPN    insulin  glargine (LANTUS ) injection vial 6 Units  6 Units SubCUTAneous Daily    methadone  (DOLOPHINE ) 10 MG/ML solution 15 mg  15 mg Oral Q24H    acetaminophen  (TYLENOL ) 160 MG/5ML solution 650 mg  650 mg Oral 4 times per day    LORazepam  (ATIVAN ) injection 0.5 mg  0.5 mg IntraVENous Q6H PRN    insulin  lispro (HUMALOG ,ADMELOG ) injection vial 0-8 Units  0-8 Units SubCUTAneous Q6H    metoclopramide  (REGLAN ) injection 5 mg  5 mg IntraVENous Q6H    promethazine  (PHENERGAN ) 25  mg in sodium chloride  0.9 % 50 mL IVPB  25 mg IntraVENous Q6H PRN    enoxaparin  (LOVENOX ) injection 40 mg  40 mg SubCUTAneous Daily    sodium chloride  flush 0.9 % injection 5-40 mL  5-40 mL IntraVENous 2 times per day    sodium chloride  flush 0.9 % injection 5-40 mL  5-40 mL IntraVENous PRN    0.9 % sodium chloride  infusion   IntraVENous PRN    clopidogrel  (PLAVIX ) tablet 75 mg  75 mg Oral Daily    labetalol  (NORMODYNE ;TRANDATE ) injection 20 mg  20 mg IntraVENous Q4H PRN    DULoxetine  (CYMBALTA ) extended release capsule 60 mg  60 mg Oral Daily    aspirin  chewable tablet 81 mg  81 mg Oral Daily    hydrALAZINE  (APRESOLINE ) injection 20 mg  20 mg IntraVENous Q6H PRN    busPIRone  (BUSPAR ) tablet 15 mg  15 mg Oral TID    linaclotide  (LINZESS ) capsule 290 mcg  290 mcg Oral QAM AC    sodium chloride  flush 0.9 % injection 5-40 mL  5-40 mL IntraVENous 2 times per day    sodium chloride  flush 0.9 % injection 5-40 mL  5-40 mL IntraVENous PRN    0.9 % sodium chloride  infusion   IntraVENous PRN    glucose chewable tablet 16 g  4 tablet Oral PRN    dextrose  bolus 10% 125 mL  125 mL IntraVENous PRN    Or    dextrose  bolus 10%  250 mL  250 mL IntraVENous PRN    glucagon  injection 1 mg  1 mg SubCUTAneous PRN    dextrose  10 % infusion   IntraVENous Continuous PRN    pantoprazole  (PROTONIX ) 40 mg in sodium chloride  (PF) 0.9 % 10 mL injection  40 mg IntraVENous Q12H          Lab Review:     Recent Labs     10/10/23  0837 10/11/23  0548 10/12/23  0601 10/12/23  1207   WBC 9.3 5.7 6.2  --    HGB 9.4* INVESTIGATED PER DELTA CHECK PROTOCOL 7.1* 7.9*   HCT 28.6* 36.9 22.1*  --    PLT 502* 415* 428*  --      Recent Labs     10/10/23  0837 10/11/23  0548 10/12/23  0601   NA 137 137 135*   K 4.1 3.5 4.3   CL 103 102 101   CO2 28 29 30    BUN 10 9 17    MG 1.6 1.7 1.8   PHOS  --  4.1 4.8*     No results found for: GLUCPOC    EGD =>   Esophagus-normal-appearing esophagus and normal-appearing gastroesophageal junction  Stomach-mild diffuse erythema throughout the stomach with sitting biopsy protocols mucosal biopsies obtained from the gastric body, angularis and gastric antrum in separate containers  Duodenum-healthy-appearing to the D3 segment with cold force mucosal biopsies obtained     Assessment / Plan:   43 yo Hx of HTN, DM, CKD 3, gastroparesis, cholecystectomy, presented w/ recurrent abd pain, found to have acute pancreatitis, severe distal SMA stenosis.  S/p vascular stenting on 12/23.  Complicated by AKI    1) AKI on CKD III - with progression. ?CIN ?IVVD considering intermittent and poor PO intake   -receiving fluids in TPN  -strict I's and O's   -hold nephrotoxic meds     2) Intractable abd pain/N/V: improves when NPO or on clears but worsens with advancement in diet. This has  recurred multiple times during this hospitalization. ?a/c pancreatitis. Severe SMA stenosis s/p stenting on 12/23 without improvement in symptoms. EGD from 12/11 with gastritis. Prior gastric emptying study NEGATIVE for gastroparesis   -continue IV PPI BID   -NPO with sips/chips   -pancreatic fecal elastase 190   -urine porphobilinogen WNL; will repeat in acute setting    -GI on board; SBFT pending   -TPN running   -complement levels and lead levels pending   -IV narcotics weaned; concerns for dependency/malingering     3) Severe distal SMA stenosis: seen on abd CTA.    -vascular performed angio/SMA stenting on 12/23.    -cont ASA, plavix    -d/w vascular; they do NOT think that repeat imaging would be useful; low suspicion that symptoms from stent failure (discussed on 12/30)     4) Acute pancreatitis: most recent CT showed resolution but symptoms persisting  -will need TPN; unable to have peripheral line placed due to poor vasculature; central line placement completed on 12/30    5) Anasarca: due to IVF, low albumin , pancreatitis.    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  Dopplers negative for DVT   -monitor     11) Substance dependence - do have concerns for malingering v. Dependence v. Now withdrawals between doses of IV pain meds. For nearly two weeks has had IV pain meds nearly around the clock   -start COWS scale for ?opiate withdrawal  -start low dose methadone  in attempt to stop IV dilaudid ; will give 15mg  today, 15mg  on 1/1 and then stop     **Prior records, notes, labs, radiology, and medications reviewed in [facility]**    Total time spent with patient care: 80 Minutes **I personally saw and examined the patient during this time period**                 Care Plan discussed with: Patient, nursing, pharmacy     Discussed:  Care Plan    Prophylaxis:  Lovenox     Disposition:  Home  w/Family           ___________________________________________________    Attending Physician: Consepcion Coombs, MD

## 2023-10-12 NOTE — Consults (Signed)
 Session ID: [id number]  Language: Spanish  Interpreter ID: #[id number]  Interpreter Name: Irving Shows

## 2023-10-12 NOTE — Plan of Care (Signed)
 Problem: Chronic Conditions and Co-morbidities  Goal: Patient's chronic conditions and co-morbidity symptoms are monitored and maintained or improved  10/12/2023 1958 by Wynn Rend, RN  Outcome: Progressing  10/12/2023 1857 by Judyth Pfeiffer, RN  Outcome: Progressing     Problem: Pain  Goal: Verbalizes/displays adequate comfort level or baseline comfort level  10/12/2023 1958 by Wynn Rend, RN  Outcome: Progressing  10/12/2023 1857 by Judyth Pfeiffer, RN  Outcome: Progressing     Problem: Safety - Adult  Goal: Free from fall injury  10/12/2023 1958 by Wynn Rend, RN  Outcome: Progressing  10/12/2023 1857 by Judyth Pfeiffer, RN  Outcome: Progressing     Problem: Nutrition Deficit:  Goal: Optimize nutritional status  10/12/2023 1958 by Wynn Rend, RN  Outcome: Progressing  10/12/2023 1857 by Judyth Pfeiffer, RN  Outcome: Progressing     Problem: Neurosensory - Adult  Goal: Achieves stable or improved neurological status  10/12/2023 1958 by Wynn Rend, RN  Outcome: Progressing  Flowsheets (Taken 10/12/2023 1945)  Achieves stable or improved neurological status:   Assess for and report changes in neurological status   Initiate measures to prevent increased intracranial pressure   Maintain blood pressure and fluid volume within ordered parameters to optimize cerebral perfusion and minimize risk of hemorrhage   Monitor temperature, glucose, and sodium. Initiate appropriate interventions as ordered  10/12/2023 1857 by Judyth Pfeiffer, RN  Outcome: Progressing  Goal: Absence of seizures  10/12/2023 1958 by Wynn Rend, RN  Outcome: Progressing  Flowsheets (Taken 10/12/2023 1945)  Absence of seizures:   Monitor for seizure activity.  If seizure occurs, document type and location of movements and any associated apnea   If seizure occurs, turn head to side and suction secretions as needed   Administer anticonvulsants as ordered   Support airway/breathing, administer oxygen as needed   Diagnostic studies as  ordered  10/12/2023 1857 by Judyth Pfeiffer, RN  Outcome: Progressing  Goal: Remains free of injury related to seizures activity  Outcome: Progressing  Flowsheets (Taken 10/12/2023 1945)  Remains free of injury related to seizure activity:   Maintain airway, patient safety  and administer oxygen as ordered   Monitor patient for seizure activity, document and report duration and description of seizure to Licensed Independent Practitioner   If seizure occurs, turn patient to side and suction secretions as needed   Reorient patient post seizure   Instruct patient/family to notify RN of any seizure activity   Instruct patient/family to call for assistance with activity based on assessment     Problem: Respiratory - Adult  Goal: Achieves optimal ventilation and oxygenation  10/12/2023 1958 by Wynn Rend, RN  Outcome: Progressing  Flowsheets (Taken 10/12/2023 1945)  Achieves optimal ventilation and oxygenation:   Assess for changes in respiratory status   Assess for changes in mentation and behavior   Position to facilitate oxygenation and minimize respiratory effort   Oxygen supplementation based on oxygen saturation or arterial blood gases   Encourage broncho-pulmonary hygiene including cough, deep breathe, incentive spirometry   Assess the need for suctioning and aspirate as needed   Assess and instruct to report shortness of breath or any respiratory difficulty   Respiratory therapy support as indicated  10/12/2023 1857 by Judyth Pfeiffer, RN  Outcome: Progressing     Problem: Cardiovascular - Adult  Goal: Maintains optimal cardiac output and hemodynamic stability  10/12/2023 1958 by Wynn Rend, RN  Outcome: Progressing  Flowsheets (Taken 10/12/2023 1945)  Maintains optimal cardiac output and hemodynamic stability:  Monitor blood pressure and heart rate   Monitor urine output and notify Licensed Independent Practitioner for values outside of normal range   Assess for signs of decreased cardiac output   Administer fluid  and/or volume expanders as ordered   Administer vasoactive medications as ordered  10/12/2023 1857 by Judyth Pfeiffer, RN  Outcome: Progressing  Goal: Absence of cardiac dysrhythmias or at baseline  Outcome: Progressing  Flowsheets (Taken 10/12/2023 1945)  Absence of cardiac dysrhythmias or at baseline:   Monitor cardiac rate and rhythm   Assess for signs of decreased cardiac output   Administer antiarrhythmia medication and electrolyte replacement as ordered     Problem: Skin/Tissue Integrity - Adult  Goal: Skin integrity remains intact  10/12/2023 1958 by Wynn Rend, RN  Outcome: Progressing  Flowsheets (Taken 10/12/2023 1945)  Skin Integrity Remains Intact: Monitor for areas of redness and/or skin breakdown  10/12/2023 1857 by Judyth Pfeiffer, RN  Outcome: Progressing  Goal: Incisions, wounds, or drain sites healing without S/S of infection  10/12/2023 1958 by Wynn Rend, RN  Outcome: Progressing  Flowsheets (Taken 10/12/2023 1945)  Incisions, Wounds, or Drain Sites Healing Without Sign and Symptoms of Infection: ADMISSION and DAILY: Assess and document risk factors for pressure ulcer development  10/12/2023 1857 by Judyth Pfeiffer, RN  Outcome: Progressing  Goal: Oral mucous membranes remain intact  10/12/2023 1958 by Wynn Rend, RN  Outcome: Progressing  10/12/2023 1857 by Judyth Pfeiffer, RN  Outcome: Progressing     Problem: Musculoskeletal - Adult  Goal: Return mobility to safest level of function  10/12/2023 1958 by Wynn Rend, RN  Outcome: Progressing  10/12/2023 1857 by Judyth Pfeiffer, RN  Outcome: Progressing  Goal: Maintain proper alignment of affected body part  10/12/2023 1958 by Wynn Rend, RN  Outcome: Progressing  10/12/2023 1857 by Judyth Pfeiffer, RN  Outcome: Progressing  Goal: Return ADL status to a safe level of function  10/12/2023 1958 by Wynn Rend, RN  Outcome: Progressing  10/12/2023 1857 by Judyth Pfeiffer, RN  Outcome: Progressing     Problem: Gastrointestinal - Adult  Goal: Minimal or  absence of nausea and vomiting  10/12/2023 1958 by Wynn Rend, RN  Outcome: Progressing  Flowsheets (Taken 10/12/2023 1945)  Minimal or absence of nausea and vomiting:   Administer IV fluids as ordered to ensure adequate hydration   Administer ordered antiemetic medications as needed   Provide nonpharmacologic comfort measures as appropriate   Advance diet as tolerated, if ordered   Nutrition consult to assist patient with adequate nutrition and appropriate food choices  10/12/2023 1857 by Judyth Pfeiffer, RN  Outcome: Progressing  Goal: Maintains or returns to baseline bowel function  10/12/2023 1958 by Wynn Rend, RN  Outcome: Progressing  Flowsheets (Taken 10/12/2023 1945)  Maintains or returns to baseline bowel function:   Assess bowel function   Encourage oral fluids to ensure adequate hydration   Administer IV fluids as ordered to ensure adequate hydration   Administer ordered medications as needed   Encourage mobilization and activity   Nutrition consult to assist patient with appropriate food choices  10/12/2023 1857 by Judyth Pfeiffer, RN  Outcome: Progressing  Goal: Maintains adequate nutritional intake  10/12/2023 1958 by Wynn Rend, RN  Outcome: Progressing  Flowsheets (Taken 10/12/2023 1945)  Maintains adequate nutritional intake:   Monitor percentage of each meal consumed   Identify factors contributing to decreased intake, treat as appropriate   Assist with meals as needed   Monitor intake and output, weight and lab  values   Obtain nutritional consult as needed  10/12/2023 1857 by Judyth Pfeiffer, RN  Outcome: Progressing  Goal: Establish and maintain optimal ostomy function  10/12/2023 1958 by Wynn Rend, RN  Outcome: Progressing  Flowsheets (Taken 10/12/2023 1945)  Establish and maintain optimal ostomy function:   Monitor output from ostomies   Administer IV fluids and TPN as ordered   Introduce and advance enteral feedings as ordered   Nutrition consult   Gastric suctioning as ordered   Infuse IV  Fluids/TPN as ordered  10/12/2023 1857 by Judyth Pfeiffer, RN  Outcome: Progressing     Problem: Genitourinary - Adult  Goal: Absence of urinary retention  10/12/2023 1958 by Wynn Rend, RN  Outcome: Progressing  10/12/2023 1857 by Judyth Pfeiffer, RN  Outcome: Progressing  Goal: Urinary catheter remains patent  10/12/2023 1958 by Wynn Rend, RN  Outcome: Progressing  10/12/2023 1857 by Judyth Pfeiffer, RN  Outcome: Progressing     Problem: Infection - Adult  Goal: Absence of infection at discharge  10/12/2023 1958 by Wynn Rend, RN  Outcome: Progressing  10/12/2023 1857 by Judyth Pfeiffer, RN  Outcome: Progressing  Goal: Absence of infection during hospitalization  10/12/2023 1958 by Wynn Rend, RN  Outcome: Progressing  10/12/2023 1857 by Judyth Pfeiffer, RN  Outcome: Progressing  Goal: Absence of fever/infection during anticipated neutropenic period  10/12/2023 1958 by Wynn Rend, RN  Outcome: Progressing  10/12/2023 1857 by Judyth Pfeiffer, RN  Outcome: Progressing     Problem: Metabolic/Fluid and Electrolytes - Adult  Goal: Electrolytes maintained within normal limits  10/12/2023 1958 by Wynn Rend, RN  Outcome: Progressing  10/12/2023 1857 by Judyth Pfeiffer, RN  Outcome: Progressing  Goal: Hemodynamic stability and optimal renal function maintained  10/12/2023 1958 by Wynn Rend, RN  Outcome: Progressing  10/12/2023 1857 by Judyth Pfeiffer, RN  Outcome: Progressing  Goal: Glucose maintained within prescribed range  10/12/2023 1958 by Wynn Rend, RN  Outcome: Progressing  10/12/2023 1857 by Judyth Pfeiffer, RN  Outcome: Progressing     Problem: Hematologic - Adult  Goal: Maintains hematologic stability  10/12/2023 1958 by Wynn Rend, RN  Outcome: Progressing  10/12/2023 1857 by Judyth Pfeiffer, RN  Outcome: Progressing

## 2023-10-13 ENCOUNTER — Inpatient Hospital Stay: Admit: 2023-10-13 | Payer: MEDICAID | Primary: Adult Health

## 2023-10-13 LAB — CBC WITH AUTO DIFFERENTIAL
Basophils %: 1 % (ref 0–1)
Basophils Absolute: 0.1 10*3/uL (ref 0.0–0.1)
Eosinophils %: 8 % — ABNORMAL HIGH (ref 0–7)
Eosinophils Absolute: 0.5 10*3/uL — ABNORMAL HIGH (ref 0.0–0.4)
Hematocrit: 22 % — ABNORMAL LOW (ref 35.0–47.0)
Hemoglobin: 7.1 g/dL — ABNORMAL LOW (ref 11.5–16.0)
Immature Granulocytes %: 0 % (ref 0.0–0.5)
Immature Granulocytes Absolute: 0 10*3/uL (ref 0.00–0.04)
Lymphocytes %: 33 % (ref 12–49)
Lymphocytes Absolute: 2 10*3/uL (ref 0.8–3.5)
MCH: 28.9 pg (ref 26.0–34.0)
MCHC: 32.3 g/dL (ref 30.0–36.5)
MCV: 89.4 FL (ref 80.0–99.0)
MPV: 8.3 FL — ABNORMAL LOW (ref 8.9–12.9)
Monocytes %: 13 % (ref 5–13)
Monocytes Absolute: 0.8 10*3/uL (ref 0.0–1.0)
Neutrophils %: 45 % (ref 32–75)
Neutrophils Absolute: 2.7 10*3/uL (ref 1.8–8.0)
Nucleated RBCs: 0 /100{WBCs}
Platelets: 385 10*3/uL (ref 150–400)
RBC: 2.46 M/uL — ABNORMAL LOW (ref 3.80–5.20)
RDW: 13.4 % (ref 11.5–14.5)
WBC: 6 10*3/uL (ref 3.6–11.0)
nRBC: 0 10*3/uL (ref 0.00–0.01)

## 2023-10-13 LAB — POCT GLUCOSE
POC Glucose: 156 mg/dL — ABNORMAL HIGH (ref 65–117)
POC Glucose: 189 mg/dL — ABNORMAL HIGH (ref 65–117)
POC Glucose: 153 mg/dL — ABNORMAL HIGH (ref 65–117)
POC Glucose: 224 mg/dL — ABNORMAL HIGH (ref 65–117)

## 2023-10-13 LAB — BASIC METABOLIC PANEL
Anion Gap: 2 mmol/L (ref 2–12)
BUN/Creatinine Ratio: 15 (ref 12–20)
BUN: 26 mg/dL — ABNORMAL HIGH (ref 6–20)
CO2: 29 mmol/L (ref 21–32)
Calcium: 7.7 mg/dL — ABNORMAL LOW (ref 8.5–10.1)
Chloride: 101 mmol/L (ref 97–108)
Creatinine: 1.7 mg/dL — ABNORMAL HIGH (ref 0.55–1.02)
Est, Glom Filt Rate: 38 mL/min/{1.73_m2} — ABNORMAL LOW (ref >60)
Glucose: 161 mg/dL — ABNORMAL HIGH (ref 65–100)
Potassium: 4.7 mmol/L (ref 3.5–5.1)
Sodium: 132 mmol/L — ABNORMAL LOW (ref 136–145)

## 2023-10-13 LAB — MAGNESIUM: Magnesium: 1.9 mg/dL (ref 1.6–2.4)

## 2023-10-13 MED ORDER — METOCLOPRAMIDE HCL 5 MG/ML IJ SOLN
5 | Freq: Four times a day (QID) | INTRAMUSCULAR | Status: DC
Start: 2023-10-13 — End: 2023-10-13

## 2023-10-13 MED ORDER — TECHNET TC 99M SULFUR COLLOID CO KIT
Freq: Once | Status: AC
Start: 2023-10-13 — End: 2023-10-13
  Administered 2023-10-13: 16:00:00 0.3 via ORAL

## 2023-10-13 MED ORDER — METOCLOPRAMIDE HCL 5 MG/ML IJ SOLN
5 | Freq: Four times a day (QID) | INTRAMUSCULAR | Status: DC
Start: 2023-10-13 — End: 2023-10-14
  Administered 2023-10-13 – 2023-10-14 (×5): 5 mg via INTRAVENOUS

## 2023-10-13 MED ORDER — CALCIUM GLUCONATE 10 % IV SOLN
10 | INTRAVENOUS | Status: AC
Start: 2023-10-13 — End: 2023-10-14
  Administered 2023-10-13: via INTRAVENOUS

## 2023-10-13 MED ORDER — FAT EMULSION PLANT BASED (SOY) 20 % IV EMUL
20 | INTRAVENOUS | Status: AC
Start: 2023-10-13 — End: 2023-10-14
  Administered 2023-10-13: 500 mL via INTRAVENOUS

## 2023-10-13 MED FILL — METOPROLOL TARTRATE 50 MG PO TABS: 50 MG | ORAL | Qty: 1

## 2023-10-13 MED FILL — TRAVASOL 10 % IV SOLN: 10 % | INTRAVENOUS | Qty: 760

## 2023-10-13 MED FILL — ACETAMINOPHEN 650 MG/20.3ML PO SOLN: 650 MG/20.3ML | ORAL | Qty: 20.3

## 2023-10-13 MED FILL — PANTOPRAZOLE SODIUM 40 MG IV SOLR: 40 MG | INTRAVENOUS | Qty: 40

## 2023-10-13 MED FILL — BUSPIRONE HCL 15 MG PO TABS: 15 MG | ORAL | Qty: 1

## 2023-10-13 MED FILL — METOCLOPRAMIDE HCL 5 MG/ML IJ SOLN: 5 MG/ML | INTRAMUSCULAR | Qty: 2

## 2023-10-13 MED FILL — INSULIN LISPRO 100 UNIT/ML IJ SOLN: 100 UNIT/ML | INTRAMUSCULAR | Qty: 2

## 2023-10-13 MED FILL — LORAZEPAM 2 MG/ML IJ SOLN: 2 MG/ML | INTRAMUSCULAR | Qty: 1

## 2023-10-13 MED FILL — LANTUS 100 UNIT/ML SC SOLN: 100 UNIT/ML | SUBCUTANEOUS | Qty: 6

## 2023-10-13 MED FILL — LINZESS 145 MCG PO CAPS: 145 MCG | ORAL | Qty: 2

## 2023-10-13 MED FILL — INTRALIPID 20 % IV EMUL: 20 % | INTRAVENOUS | Qty: 500

## 2023-10-13 NOTE — Progress Notes (Signed)
 Security Alert Called at [facility]    Responded to security alert at 1523 for a missing adult.    Provider at bedside: NO  Interventions ordered: none  Sepsis Suspected: NA  Transfer to Higher Level of Care: No    Security Alert went off for a missing adult.     Upon assessment, pt was back in her room while staff got her VS. Pt is a Hispanic speaker who did not know she wasn't allowed to leave the unit. She was reeducated that she must talk to her nurse before she is able to leave the unit. Pt states she understands.     Vitals:    10/13/23 1526   BP: (!) 194/107   Pulse: (!) 113   Resp: 14   Temp: 98.4 F (36.9 C)   SpO2: 98%      Rapid Ended at 1530  RRT RN assisted with transport to accepting unit NA.    Julee Brodie, RN

## 2023-10-13 NOTE — Plan of Care (Signed)
 Problem: Chronic Conditions and Co-morbidities  Goal: Patient's chronic conditions and co-morbidity symptoms are monitored and maintained or improved  Outcome: Progressing     Problem: Pain  Goal: Verbalizes/displays adequate comfort level or baseline comfort level  Outcome: Progressing     Problem: Safety - Adult  Goal: Free from fall injury  Outcome: Progressing     Problem: Nutrition Deficit:  Goal: Optimize nutritional status  Outcome: Progressing     Problem: Neurosensory - Adult  Goal: Achieves stable or improved neurological status  Outcome: Progressing  Goal: Absence of seizures  Outcome: Progressing  Goal: Remains free of injury related to seizures activity  Outcome: Progressing     Problem: Respiratory - Adult  Goal: Achieves optimal ventilation and oxygenation  Outcome: Progressing     Problem: Cardiovascular - Adult  Goal: Maintains optimal cardiac output and hemodynamic stability  Outcome: Progressing  Goal: Absence of cardiac dysrhythmias or at baseline  Outcome: Progressing     Problem: Skin/Tissue Integrity - Adult  Goal: Skin integrity remains intact  Outcome: Progressing  Goal: Incisions, wounds, or drain sites healing without S/S of infection  Outcome: Progressing  Goal: Oral mucous membranes remain intact  Outcome: Progressing     Problem: Musculoskeletal - Adult  Goal: Return mobility to safest level of function  Outcome: Progressing  Goal: Maintain proper alignment of affected body part  Outcome: Progressing  Goal: Return ADL status to a safe level of function  Outcome: Progressing     Problem: Gastrointestinal - Adult  Goal: Minimal or absence of nausea and vomiting  Outcome: Progressing  Goal: Maintains or returns to baseline bowel function  Outcome: Progressing  Goal: Maintains adequate nutritional intake  Outcome: Progressing  Goal: Establish and maintain optimal ostomy function  Outcome: Progressing     Problem: Genitourinary - Adult  Goal: Absence of urinary retention  Outcome:  Progressing  Goal: Urinary catheter remains patent  Outcome: Progressing     Problem: Infection - Adult  Goal: Absence of infection at discharge  Outcome: Progressing  Goal: Absence of infection during hospitalization  Outcome: Progressing  Goal: Absence of fever/infection during anticipated neutropenic period  Outcome: Progressing     Problem: Metabolic/Fluid and Electrolytes - Adult  Goal: Electrolytes maintained within normal limits  Outcome: Progressing  Goal: Hemodynamic stability and optimal renal function maintained  Outcome: Progressing  Goal: Glucose maintained within prescribed range  Outcome: Progressing     Problem: Hematologic - Adult  Goal: Maintains hematologic stability  Outcome: Progressing

## 2023-10-13 NOTE — Consults (Signed)
 Session ID: [id number]  Language: Spanish  Interpreter ID: #[id number]  Interpreter Name: Jonetta Speak

## 2023-10-13 NOTE — Progress Notes (Signed)
Per RN pt found walking around downstairs with daughter; unclear perhaps if was eating

## 2023-10-13 NOTE — Progress Notes (Signed)
 Comprehensive Nutrition Assessment    Type and Reason for Visit: Reassess    Nutrition Recommendations/Plan:   Monitor diet tolerance- pt NPO again 2/2 vomiting and scheduled procedures  Continue PN - TPN @ 63 mL/hr.        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 MD and RN report pt had chunky and colorful vomit this morning as if pt had somewhat recently eaten something. Pt denies eating. Continue TPN @ goal rate of 63 mL/hr with lipids x3 per week to continue to meet >75% of needs as pt is unable to tolerate PO. Monitor results of SBFT and other procedures.       12/30: Pt visited after breakfast and IDR. MD reports pt ate clear liquid diet and then vomited copious amounts. Pt seen ~10am. Pt sipping on Ginger ale and then proceeded to vomit after a few minutes. Discussed PN since pt hasn't been able to tolerate PO. Pt understands and agreeable. Author asked why pt keeps drinking Ginger ale/juice if it seems to cause vomiting, and pt reports significant thirst. Pt concerned around not being able to drink. Discussed IVF vs PN tonight and getting hydration via IV. Discussed with GI - considering a SBFT possibly. Monitor if done/results and alternate testing.           12/27: Pt s/p SMA  stenting. Pt has been without sufficient kcal x 12 days. Pt appeared to be improving a little after SMA stent/angioplasty but pt seems to have regressed with symptoms. Noted constipation although pt with BM today. Hopefully pt will feel better and tolerate lunch better. If unable to advance diet and tolerate >50% of Full liquids or better, recommend PN until GI status improves. Consult for PN recs if needed.       12/23: Pt with poor PO over last several days of admission and reported poor PO PTA with abdominal pain and n/v. No new weights have been taken - pt wt has been pt stated since previous admission. Please obtain measured weight when pt returns from procedure. Pt with SMA stenting today. After diet starts, document PO intake in flowsheets. Monitor tolerance and pain/n/v. Pt with documented hx of gastroparesis, but GI notes indicate Gastric empty study didn't show gastroparesis. Added 4 carb diet to help regulate BG. No known food allergies. No chew/swallow difficulties normally. BG in the 130-180s. Cr 2.34. A1c 7.5.       Current Nutrition Therapies:  PN-Adult 2-in-1 Central Line (Custom)  Diet NPO  Meal Intake:  Patient Vitals for the past 168 hrs:   PO Meals Eaten (%)   10/09/23 1245 0%   10/09/23 1015 0%   10/07/23 0907 1 - 25%     Supplement Intake:  No data found.  Nutrition Support: n/a    Nutritionally Significant Medications:  Scheduled Meds:   metoclopramide   5 mg IntraVENous Q6H    metoprolol  tartrate  50 mg Oral BID    cloNIDine   1 patch TransDERmal Weekly    insulin  glargine  6 Units SubCUTAneous Daily    acetaminophen   650 mg Oral 4 times per day    insulin  lispro  0-8 Units SubCUTAneous Q6H    [Held by provider] enoxaparin   40 mg SubCUTAneous Daily    sodium chloride  flush  5-40 mL IntraVENous 2 times per day    clopidogrel   75 mg Oral Daily    DULoxetine   60 mg Oral Daily    aspirin   81 mg Oral Daily    busPIRone   15 mg Oral TID    linaclotide   290 mcg Oral QAM AC    sodium chloride  flush   5-40 mL IntraVENous 2 times per day    pantoprazole  (PROTONIX ) 40 mg in sodium chloride  (PF) 0.9 % 10 mL injection  40 mg IntraVENous Q12H     Continuous Infusions:   PN-Adult 2-in-1 Central Line (Custom) 62.5 mL/hr at 10/13/23 0016    sodium chloride       sodium chloride  5 mL/hr at 10/05/23 0901    dextrose        PRN Meds:.cloNIDine , dicyclomine , naloxone , LORazepam , promethazine  (PHENERGAN ) 25 mg in sodium chloride  0.9 % 50 mL IVPB, sodium chloride  flush, sodium chloride , labetalol , hydrALAZINE , sodium chloride  flush, sodium chloride , glucose, dextrose  bolus **OR** dextrose  bolus, glucagon  (rDNA), dextrose       Estimated Daily Nutrient Needs:  Energy Requirements Based On: Formula  Weight Used for Energy Requirements: Current  Energy (kcal/day): 1551 (MSJX1.3)  Weight Used for Protein Requirements: Current  Protein (g/day): 73  Method Used for Fluid Requirements: 1 ml/kcal  Fluid (ml/day): 1550 mL    Nutrition Related Findings:   Edema: Generalized   Edema Generalized: Trace  RUE Edema: Trace  LUE Edema: Trace  RLE Edema: Trace  LLE Edema: Trace    Bowel Movement:  10/11/22    Wounds: Wound Type: None      Anthropometric Measures:  Height: 149.9 cm (4' 11.02)  Ideal Body Weight (IBW): 95 lbs (43 kg)    Admission Body Weight: 56.7 kg (125 lb)  Current Body Weight: 61.2 kg (134 lb 14.7 oz), 131.6 % IBW. Weight Source: Standing scale  Current BMI (kg/m2): 27.2  Usual Body Weight: 61.2 kg (135 lb)  % Weight Change (Calculated): -0.1  Weight Adjustment For: No Adjustment                 BMI Categories: Overweight (BMI 25.0-29.9)    Wt Readings from Last 20 Encounters:   10/12/23 60.4 kg (133 lb 3.2 oz)   10/03/23 56.7 kg (125 lb)   09/22/23 56.7 kg (125 lb)   09/06/23 56.7 kg (125 lb)   08/31/23 56.7 kg (125 lb)   07/19/23 59 kg (130 lb)   06/17/23 59 kg (130 lb)   06/11/23 59 kg (130 lb 1.1 oz)   05/28/23 59 kg (130 lb)   05/08/23 60 kg (132 lb 4.4 oz)   03/16/23 61.2 kg (135 lb)   02/17/23 61.2 kg (135 lb)    02/10/23 61.2 kg (135  lb)   02/01/23 65.8 kg (145 lb)   01/18/23 65.8 kg (145 lb)   06/28/22 59.9 kg (132 lb)   05/15/22 62.6 kg (138 lb)   02/16/22 65.8 kg (144 lb 15.9 oz)   09/07/21 64.9 kg (143 lb)   07/27/21 56.7 kg (125 lb)           Nutrition Diagnosis:   Inadequate protein-energy intake related to Altered GI structure as evidenced by intake 0-25%, weight loss, GI abnormality    Nutrition Interventions:   Food and/or Nutrient Delivery: Start Parenteral Nutrition  Nutrition Education/Counseling: No recommendation at this time  Coordination of Nutrition Care: Continue to monitor while inpatient, Interdisciplinary Rounds       Goals:     Goals: Tolerate nutrition support at goal rate       Nutrition Monitoring and Evaluation:   Behavioral-Environmental Outcomes: None Identified  Food/Nutrient Intake Outcomes: Diet Advancement/Tolerance, Parenteral Nutrition Intake/Tolerance  Physical Signs/Symptoms Outcomes: Biochemical Data, Weight    Discharge Planning:    Too soon to determine     Melinda [name], RD  Available via [facility]  Office # [phone]

## 2023-10-13 NOTE — Progress Notes (Signed)
 [facility]  [address] [place], [place] [address]  [phone]        Hospitalist Progress Note      NAME: Melinda [name]   DOB:  08/25/1981  MRM:  [id number]    Date/Time of service: 10/13/2023  9:56 AM       Subjective:     Chief Complaint:  Patient was personally seen and examined by me during this time period.  Chart reviewed.     Pt seen and examined. Reports improved nausea/vomiting as well as pain but while on with translator had copious emesis with diaphoresis. Of note the vomitus appeared thick with chunks of solid food. Asked if she is eating and she stated no. Discussed pending SBFT and need to be NPO for this        Objective:       Vitals:       Last 24hrs VS reviewed since prior progress note. Most recent are:    Vitals:    10/13/23 0914   BP: (!) 165/89   Pulse:    Resp:    Temp:    SpO2:      SpO2 Readings from Last 6 Encounters:   10/13/23 98%   09/22/23 98%   09/14/23 100%   08/31/23 100%   07/19/23 100%   06/17/23 100%          Intake/Output Summary (Last 24 hours) at 10/13/2023 9043  Last data filed at 10/12/2023 2023  Gross per 24 hour   Intake 10 ml   Output --   Net 10 ml            Exam:     Physical Exam:    Gen: NAD   HEENT:  Pink conjunctivae, PERRL, hearing intact to voice, moist mucous membranes  Neck:  Supple, without masses, thyroid non-tender  Resp:  No accessory muscle use, clear breath sounds without wheezes rales or rhonchi  Card:  No murmurs, normal S1, S2 without thrills   Abd: improving epigastric tenderness   Musc:  No cyanosis or clubbing, L arm swelling  Skin:  No rashes   Neuro:  Cranial nerves 3-12 are grossly intact, follows commands appropriately  Psych:  a0x3    Medications Reviewed: (see below)    Lab Data Reviewed: (see below)    ______________________________________________________________________    Medications:     Current Facility-Administered Medications   Medication Dose Route Frequency    metoclopramide  (REGLAN ) injection 5  mg  5 mg IntraVENous Q6H    PN-Adult 2-in-1 Central Line (Custom)   IntraVENous Continuous TPN    metoprolol  tartrate (LOPRESSOR ) tablet 50 mg  50 mg Oral BID    cloNIDine  (CATAPRES ) 0.3 MG/24HR 1 patch  1 patch TransDERmal Weekly    cloNIDine  (CATAPRES ) tablet 0.1 mg  0.1 mg Oral Q6H PRN    dicyclomine  (BENTYL ) tablet 20 mg  20 mg Oral Q6H PRN    naloxone  (NARCAN ) injection 0.4 mg  0.4 mg IntraVENous PRN    insulin  glargine (LANTUS ) injection vial 6 Units  6 Units SubCUTAneous Daily    acetaminophen  (TYLENOL ) 160 MG/5ML solution 650 mg  650 mg Oral 4 times per day    LORazepam  (ATIVAN ) injection 0.5 mg  0.5 mg IntraVENous Q6H PRN    insulin  lispro (HUMALOG ,ADMELOG ) injection vial 0-8 Units  0-8 Units SubCUTAneous Q6H    promethazine  (PHENERGAN ) 25 mg in sodium chloride  0.9 % 50 mL IVPB  25 mg IntraVENous  Q6H PRN    [Held by provider] enoxaparin  (LOVENOX ) injection 40 mg  40 mg SubCUTAneous Daily    sodium chloride  flush 0.9 % injection 5-40 mL  5-40 mL IntraVENous 2 times per day    sodium chloride  flush 0.9 % injection 5-40 mL  5-40 mL IntraVENous PRN    0.9 % sodium chloride  infusion   IntraVENous PRN    clopidogrel  (PLAVIX ) tablet 75 mg  75 mg Oral Daily    labetalol  (NORMODYNE ;TRANDATE ) injection 20 mg  20 mg IntraVENous Q4H PRN    DULoxetine  (CYMBALTA ) extended release capsule 60 mg  60 mg Oral Daily    aspirin  chewable tablet 81 mg  81 mg Oral Daily    hydrALAZINE  (APRESOLINE ) injection 20 mg  20 mg IntraVENous Q6H PRN    busPIRone  (BUSPAR ) tablet 15 mg  15 mg Oral TID    linaclotide  (LINZESS ) capsule 290 mcg  290 mcg Oral QAM AC    sodium chloride  flush 0.9 % injection 5-40 mL  5-40 mL IntraVENous 2 times per day    sodium chloride  flush 0.9 % injection 5-40 mL  5-40 mL IntraVENous PRN    0.9 % sodium chloride  infusion   IntraVENous PRN    glucose chewable tablet 16 g  4 tablet Oral PRN    dextrose  bolus 10% 125 mL  125 mL IntraVENous PRN    Or    dextrose  bolus 10% 250 mL  250 mL IntraVENous PRN    glucagon   injection 1 mg  1 mg SubCUTAneous PRN    dextrose  10 % infusion   IntraVENous Continuous PRN    pantoprazole  (PROTONIX ) 40 mg in sodium chloride  (PF) 0.9 % 10 mL injection  40 mg IntraVENous Q12H          Lab Review:     Recent Labs     10/11/23  0548 10/12/23  0601 10/12/23  1207 10/13/23  0124   WBC 5.7 6.2  --  6.0   HGB INVESTIGATED PER DELTA CHECK PROTOCOL 7.1* 7.9* 7.1*   HCT 36.9 22.1*  --  22.0*   PLT 415* 428*  --  385     Recent Labs     10/11/23  0548 10/12/23  0601 10/13/23  0124   NA 137 135* 132*   K 3.5 4.3 4.7   CL 102 101 101   CO2 29 30 29    BUN 9 17 26*   MG 1.7 1.8 1.9   PHOS 4.1 4.8*  --      No results found for: GLUCPOC    EGD =>   Esophagus-normal-appearing esophagus and normal-appearing gastroesophageal junction  Stomach-mild diffuse erythema throughout the stomach with sitting biopsy protocols mucosal biopsies obtained from the gastric body, angularis and gastric antrum in separate containers  Duodenum-healthy-appearing to the D3 segment with cold force mucosal biopsies obtained     Assessment / Plan:   43 yo Hx of HTN, DM, CKD 3, gastroparesis, cholecystectomy, presented w/ recurrent abd pain, found to have acute pancreatitis, severe distal SMA stenosis.  S/p vascular stenting on 12/23.  Complicated by AKI    1) AKI on CKD III - with progression. ?CIN ?IVVD considering intermittent and poor PO intake   -receiving fluids in TPN  -strict I's and O's   -hold nephrotoxic meds     2) Intractable abd pain/N/V: improves when NPO or on clears but worsens with advancement in diet. This has recurred multiple times during this hospitalization. ?a/c pancreatitis. Severe SMA  stenosis s/p stenting on 12/23 without improvement in symptoms. EGD from 12/11 with gastritis. Prior gastric emptying study NEGATIVE for gastroparesis   -continue IV PPI BID   -NPO for SBFT   -will repeat gastric emptying   -pancreatic fecal elastase 190   -urine porphobilinogen WNL; will repeat in acute setting   -GI on board    -TPN renewed   -complement levels and lead levels pending   -IV narcotics weaned; concerns for dependency/malingering     3) Severe distal SMA stenosis: seen on abd CTA.    -vascular performed angio/SMA stenting on 12/23.    -cont ASA, plavix    -d/w vascular; they do NOT think that repeat imaging would be useful; low suspicion that symptoms from stent failure (discussed on 12/30)     4) Acute pancreatitis: most recent CT showed resolution but symptoms persisting  -will need TPN; unable to have peripheral line placed due to poor vasculature; central line placement completed on 12/30    5) Anasarca: due to IVF, low albumin , pancreatitis.    -see management above   -prealbumin low which is suspected   -does have overt proteinuria on UA which is likely contributing; resume ARB once Cr stable     6) HTN: variable intake   -on clonidine  2/2 N/V  -resume PO metoprolol ; may need to change to IV again if continues to vomit  -PRN hydralazine      7) DM type 2: A1C 7.4%  -ISS   -BG checks AC TID and qHS   -on Lantus  with TPN    8) Anemia: iron  studies consistent with ACD  -type and screen 2/2 downtrending H+H   -check stool blood   -on PPI BID     9) Mood disorder:   -on buspar , cymbalta      10) L arm swelling: likely from IVF, anasarca.  Dopplers negative for DVT   -monitor     11) Substance dependence - do have concerns for malingering v. Dependence v. Now withdrawals between doses of IV pain meds. For nearly two weeks has had IV pain meds nearly around the clock   -on COWS scale for ?opiate withdrawal  -s/p methadone  15mg  on 12/31 and 1/1; will stop now     **Prior records, notes, labs, radiology, and medications reviewed in [facility]**    Total time spent with patient care: 23 Minutes **I personally saw and examined the patient during this time period**                 Care Plan discussed with: Patient, nursing, pharmacy     Discussed:  Care Plan    Prophylaxis:  Lovenox     Disposition:  Home  w/Family           ___________________________________________________    Attending Physician: Consepcion Coombs, MD

## 2023-10-14 LAB — BASIC METABOLIC PANEL
Anion Gap: 4 mmol/L (ref 2–12)
BUN/Creatinine Ratio: 21 — ABNORMAL HIGH (ref 12–20)
BUN: 33 mg/dL — ABNORMAL HIGH (ref 6–20)
CO2: 28 mmol/L (ref 21–32)
Calcium: 8.1 mg/dL — ABNORMAL LOW (ref 8.5–10.1)
Chloride: 102 mmol/L (ref 97–108)
Creatinine: 1.56 mg/dL — ABNORMAL HIGH (ref 0.55–1.02)
Est, Glom Filt Rate: 43 mL/min/{1.73_m2} — ABNORMAL LOW (ref >60)
Glucose: 196 mg/dL — ABNORMAL HIGH (ref 65–100)
Potassium: 4.2 mmol/L (ref 3.5–5.1)
Sodium: 134 mmol/L — ABNORMAL LOW (ref 136–145)

## 2023-10-14 LAB — CBC WITH AUTO DIFFERENTIAL
Basophils %: 1 % (ref 0–1)
Basophils Absolute: 0 10*3/uL (ref 0.0–0.1)
Eosinophils %: 9 % — ABNORMAL HIGH (ref 0–7)
Eosinophils Absolute: 0.5 10*3/uL — ABNORMAL HIGH (ref 0.0–0.4)
Hematocrit: 22 % — ABNORMAL LOW (ref 35.0–47.0)
Hemoglobin: 7.2 g/dL — ABNORMAL LOW (ref 11.5–16.0)
Immature Granulocytes %: 0 % (ref 0.0–0.5)
Immature Granulocytes Absolute: 0 10*3/uL (ref 0.00–0.04)
Lymphocytes %: 27 % (ref 12–49)
Lymphocytes Absolute: 1.5 10*3/uL (ref 0.8–3.5)
MCH: 28.8 pg (ref 26.0–34.0)
MCHC: 32.7 g/dL (ref 30.0–36.5)
MCV: 88 FL (ref 80.0–99.0)
MPV: 8.4 FL — ABNORMAL LOW (ref 8.9–12.9)
Monocytes %: 12 % (ref 5–13)
Monocytes Absolute: 0.7 10*3/uL (ref 0.0–1.0)
Neutrophils %: 51 % (ref 32–75)
Neutrophils Absolute: 3 10*3/uL (ref 1.8–8.0)
Nucleated RBCs: 0 /100{WBCs}
Platelets: 466 10*3/uL — ABNORMAL HIGH (ref 150–400)
RBC: 2.5 M/uL — ABNORMAL LOW (ref 3.80–5.20)
RDW: 13.3 % (ref 11.5–14.5)
WBC: 5.8 10*3/uL (ref 3.6–11.0)
nRBC: 0 10*3/uL (ref 0.00–0.01)

## 2023-10-14 LAB — POCT GLUCOSE
POC Glucose: 204 mg/dL — ABNORMAL HIGH (ref 65–117)
POC Glucose: 213 mg/dL — ABNORMAL HIGH (ref 65–117)
POC Glucose: 293 mg/dL — ABNORMAL HIGH (ref 65–117)
POC Glucose: 168 mg/dL — ABNORMAL HIGH (ref 65–117)

## 2023-10-14 LAB — MAGNESIUM: Magnesium: 2.1 mg/dL (ref 1.6–2.4)

## 2023-10-14 LAB — PHOSPHORUS: Phosphorus: 3.8 mg/dL (ref 2.6–4.7)

## 2023-10-14 MED ORDER — CLOPIDOGREL BISULFATE 75 MG PO TABS
75 | ORAL_TABLET | Freq: Every day | ORAL | 3 refills | Status: DC
Start: 2023-10-14 — End: 2024-08-28

## 2023-10-14 MED ORDER — DULOXETINE HCL 60 MG PO CPEP
60 | ORAL_CAPSULE | Freq: Every day | ORAL | 3 refills | Status: DC
Start: 2023-10-14 — End: 2024-08-28

## 2023-10-14 MED ORDER — BUSPIRONE HCL 15 MG PO TABS
15 | ORAL_TABLET | Freq: Three times a day (TID) | ORAL | 0 refills | Status: DC
Start: 2023-10-14 — End: 2024-08-28

## 2023-10-14 MED ORDER — ASPIRIN 81 MG PO CHEW
81 | ORAL_TABLET | Freq: Every day | ORAL | 3 refills | 30.00000 days | Status: DC
Start: 2023-10-14 — End: 2024-08-28

## 2023-10-14 MED FILL — CLOPIDOGREL BISULFATE 75 MG PO TABS: 75 MG | ORAL | Qty: 1

## 2023-10-14 MED FILL — DULOXETINE HCL 30 MG PO CPEP: 30 MG | ORAL | Qty: 2

## 2023-10-14 MED FILL — LINZESS 145 MCG PO CAPS: 145 MCG | ORAL | Qty: 2

## 2023-10-14 MED FILL — LANTUS 100 UNIT/ML SC SOLN: 100 UNIT/ML | SUBCUTANEOUS | Qty: 6

## 2023-10-14 MED FILL — INSULIN LISPRO 100 UNIT/ML IJ SOLN: 100 UNIT/ML | INTRAMUSCULAR | Qty: 2

## 2023-10-14 MED FILL — BUSPIRONE HCL 15 MG PO TABS: 15 MG | ORAL | Qty: 1

## 2023-10-14 MED FILL — METOCLOPRAMIDE HCL 5 MG/ML IJ SOLN: 5 MG/ML | INTRAMUSCULAR | Qty: 2

## 2023-10-14 MED FILL — PANTOPRAZOLE SODIUM 40 MG IV SOLR: 40 MG | INTRAVENOUS | Qty: 40

## 2023-10-14 MED FILL — METOPROLOL TARTRATE 50 MG PO TABS: 50 MG | ORAL | Qty: 1

## 2023-10-14 MED FILL — LABETALOL HCL 5 MG/ML IV SOLN: 5 MG/ML | INTRAVENOUS | Qty: 4

## 2023-10-14 MED FILL — INSULIN LISPRO 100 UNIT/ML IJ SOLN: 100 UNIT/ML | INTRAMUSCULAR | Qty: 4

## 2023-10-14 MED FILL — ASPIRIN LOW DOSE 81 MG PO CHEW: 81 MG | ORAL | Qty: 1

## 2023-10-14 MED FILL — DICYCLOMINE HCL 20 MG PO TABS: 20 MG | ORAL | Qty: 1

## 2023-10-14 NOTE — Progress Notes (Signed)
 Physician Progress Note      PATIENTBETHA               [name], Melinda [name]  CSN #:                  [id number]  DOB:                       1981/03/21  ADMIT DATE:       09/25/2023 12:17 AM  DISCH DATE:  RESPONDING  PROVIDER #:        Raizy Auzenne MD          QUERY TEXT:    Good afternoon.    Patient admitted with intractable nausea and vomiting. Noted to have moderate   malnutrition documented in 01/02 Registered Dietician note:    If possible, please document in progress notes and discharge summary if you   are evaluating and /or treating any of the following:    The medical record reflects the following:    Risk Factors: 43 yr old female, severe distal SMA stenosis, DM, HTN, acute   pancreatitis, acute illness    Clinical Indicators: from 01/02 RD note: Malnutrition Assessment:   Malnutrition Status:  Moderate malnutrition (10/10/23 1053) Context:  Acute   Illness  Findings of the 6 clinical characteristics of malnutrition: Energy Intake:    50% or less of estimated energy requirements for 5 or more days Weight Loss:    Mild weight loss Body Fat Loss:  Mild body fat loss Muscle Mass Loss:  Mild   muscle mass loss Fluid Accumulation:  No fluid accumulation Grip Strength:    Not Performed    Treatment: Registered Dietician Malnutrition Assessment, TPN        Thank you,  Leonor Darner, RN, CDI  _________________    TRUDE Criteria:    https://aspenjournals.onlinelibrary.wiley.com/doi/full/10.1177/014860711244028  5  Options provided:  -- Protein calorie malnutrition moderate  -- Other - I will add my own diagnosis  -- Disagree - Not applicable / Not valid  -- Disagree - Clinically unable to determine / Unknown  -- Refer to Clinical Documentation Reviewer    PROVIDER RESPONSE TEXT:    This patient has moderate protein calorie malnutrition.    Query created by: Darner Leonor on 10/14/2023 4:19 PM      Electronically signed by:  Atlee Kluth MD 10/14/2023 4:32 PM

## 2023-10-14 NOTE — Progress Notes (Signed)
 Not in room on my rounds. Labs stable.   Available as needed over weekend

## 2023-10-14 NOTE — Telephone Encounter (Signed)
 New patient appt scheduled for 10/17/2023  3:30 PM with Dr. [name].     Attempted to call patient to reschedule, per provider's request (inclement weather.)     No answer - unable to leave voicemail.

## 2023-10-14 NOTE — Progress Notes (Signed)
 Melinda Morlock, PA-C                       [phone] office             Monday-Friday 8:00 am-4:30 pm  I am not permitted to use perfect serve use above for contact, thanks.        Gastroenterology Progress Note    October 14, 2023  Admit Date: 09/25/2023         Narrative Assessment and Plan   43 y.o. female followed by GI for abdominal pain, nausea, vomiting.  Gastric emptying study this admission with decreased emptying time, but patient also on methadone .  GES previously had not demonstrated abnormal emptying time.  Reglan  restarted.  She was on PPN which has been discontinued.  Underwent stenting of SMA stenosis.  SBFT with normal findings.    Impression:  Abdominal pain  Nausea and vomiting    Plan:  Continue on Movantik  for constipation which is worsened by recent significant opioid pain medication use while admitted.  Continue on Linzess  at discharge.  Continue on MiraLAX  17 g twice daily for management of significant constipation likely contributing to abdominal pain, nausea, and vomiting.  Outpatient follow-up with GI.    Melinda JINNY Peach, PA-C    Subjective:   Chief Complaint: Abdominal pain, nausea, vomiting    HPI: Patient with some improvement in abdominal pain, nausea.  Tolerating clear liquid diet with intermittent vomiting.      Documented episode yesterday of vomiting solid and colorful food contents, not consistent with clear liquid diet.  Mixing adult called this week, patient located downstairs.  Unclear if patient had been eating downstairs.    Remote Spanish interpreter [place], [id number] # [id number], provided translation services for this encounter.     ROS:  The previous review of systems on initial consultation / H&P is noted and reviewed.  Specific changes noted above in HPI.    Current Medications:     Current Facility-Administered Medications   Medication Dose Route Frequency    metoclopramide   (REGLAN ) injection 5 mg  5 mg IntraVENous Q6H    PN-Adult 2-in-1 Central Line (Custom)   IntraVENous Continuous TPN    fat emulsion (INTRALIPID /NUTRILIPID ) 20 % infusion 500 mL  500 mL IntraVENous Once per day on Tuesday Thursday Saturday    metoprolol  tartrate (LOPRESSOR ) tablet 50 mg  50 mg Oral BID    cloNIDine  (CATAPRES ) 0.3 MG/24HR 1 patch  1 patch TransDERmal Weekly    cloNIDine  (CATAPRES ) tablet 0.1 mg  0.1 mg Oral Q6H PRN    dicyclomine  (BENTYL ) tablet 20 mg  20 mg Oral Q6H PRN    naloxone  (NARCAN ) injection 0.4 mg  0.4 mg IntraVENous PRN    insulin  glargine (LANTUS ) injection vial 6 Units  6 Units SubCUTAneous Daily    acetaminophen  (TYLENOL ) 160 MG/5ML solution 650 mg  650 mg Oral 4 times per day    LORazepam  (ATIVAN ) injection 0.5 mg  0.5 mg IntraVENous Q6H PRN    insulin  lispro (HUMALOG ,ADMELOG ) injection vial 0-8 Units  0-8 Units SubCUTAneous Q6H    promethazine  (PHENERGAN ) 25 mg in sodium chloride  0.9 % 50 mL IVPB  25 mg IntraVENous Q6H PRN    [Held by provider] enoxaparin  (LOVENOX ) injection 40 mg  40 mg SubCUTAneous Daily    sodium chloride  flush 0.9 % injection 5-40 mL  5-40 mL IntraVENous 2 times per day    sodium chloride  flush 0.9 % injection 5-40 mL  5-40 mL IntraVENous PRN    0.9 % sodium chloride  infusion   IntraVENous PRN    clopidogrel  (PLAVIX ) tablet 75 mg  75 mg Oral Daily    labetalol  (NORMODYNE ;TRANDATE ) injection 20 mg  20 mg IntraVENous Q4H PRN    DULoxetine  (CYMBALTA ) extended release capsule 60 mg  60 mg Oral Daily    aspirin  chewable tablet 81 mg  81 mg Oral Daily    hydrALAZINE  (APRESOLINE ) injection 20 mg  20 mg IntraVENous Q6H PRN    busPIRone  (BUSPAR ) tablet 15 mg  15 mg Oral TID    linaclotide  (LINZESS ) capsule 290 mcg  290 mcg Oral QAM AC    sodium chloride  flush 0.9 % injection 5-40 mL  5-40 mL IntraVENous 2 times per day    sodium chloride  flush 0.9 % injection 5-40 mL  5-40 mL IntraVENous PRN    0.9 % sodium chloride  infusion   IntraVENous PRN    glucose chewable tablet 16  g  4 tablet Oral PRN    dextrose  bolus 10% 125 mL  125 mL IntraVENous PRN    Or    dextrose  bolus 10% 250 mL  250 mL IntraVENous PRN    glucagon  injection 1 mg  1 mg SubCUTAneous PRN    dextrose  10 % infusion   IntraVENous Continuous PRN    pantoprazole  (PROTONIX ) 40 mg in sodium chloride  (PF) 0.9 % 10 mL injection  40 mg IntraVENous Q12H       Objective:     VITALS:   Last 24hrs VS reviewed since prior progress note. Most recent are:  Vitals:    10/14/23 1149   BP: (!) 180/93   Pulse: 92   Resp: 12   Temp: 98.2 F (36.8 C)   SpO2: 99%     Temp (24hrs), Avg:97.9 F (36.6 C), Min:97.3 F (36.3 C), Max:98.4 F (36.9 C)    No intake or output data in the 24 hours ending 10/14/23 1533    General: alert and no distress  Head: Normocephalic, without obvious abnormality, atraumatic  Eyes: anicteric sclerae and conjuntiva clear  Lungs: normal respiratory effort  Ext: no cyanosis and no edema  Skin: normal skin color, no rashes, and texture normal  Neuro:  Alert and oriented  Psych: not anxious, cooperative, appropriate affect     Lab Data Reviewed:   Recent Labs     10/12/23  0601 10/12/23  1207 10/13/23  0124 10/14/23  0602   WBC 6.2  --  6.0 5.8   HGB 7.1* 7.9* 7.1* 7.2*   HCT 22.1*  --  22.0* 22.0*   PLT 428*  --  385 466*     Recent Labs     10/12/23  0601 10/13/23  0124 10/14/23  0602   NA 135* 132* 134*   K 4.3 4.7 4.2   CL 101 101 102   CO2 30 29 28    BUN 17 26* 33*   MG 1.8 1.9 2.1   PHOS 4.8*  --  3.8     No results found for: GLUCPOC  No results for input(s): PH, PCO2, PO2, HCO3, FIO2 in the last 72 hours.  No results for input(s): INR in the last 72 hours.        Assessment:   (See above)  Principal Problem:    Intractable nausea and vomiting  Active Problems:    Intractable abdominal pain  Resolved Problems:    * No resolved hospital problems. *  Plan:   (See above)      Signed By: [name], PA-C     10/14/2023  3:33 PM

## 2023-10-14 NOTE — Discharge Instructions (Signed)
 ACUTE DIAGNOSES:  Epigastric pain [R10.13]  Acute renal insufficiency [N28.9]  Intractable nausea and vomiting [R11.2]  Nausea and vomiting, unspecified vomiting type [R11.2]  Intractable abdominal pain [R10.9]    CHRONIC MEDICAL DIAGNOSES:  @RPROB @    DISCHARGE MEDICATIONS:   @DISCHARGEMEDS @    It is important that you take the medication exactly as they are prescribed.   Keep your medication in the bottles provided by the pharmacist and keep a list of the medication names, dosages, and times to be taken in your wallet.   Do not take other medications without consulting your doctor.       DIET:  low fat, low cholesterol diet    ACTIVITY: activity as tolerated    ADDITIONAL INFORMATION: If you experience any of the following symptoms then please call your primary care physician or return to the emergency room if you cannot get hold of your doctor: Fever, chills, nausea, vomiting, diarrhea, change in mentation, falling, bleeding, shortness of breath.    FOLLOW UP CARE:  Dr. Unice @  you are to call and set up an appointment to see them in 5 days.    Follow-up  No follow-up provider specified.      Information obtained by :  I understand that if any problems occur once I am at home I am to contact my physician.    I understand and acknowledge receipt of the instructions indicated above.                                                                                                                                           Physician's or R.N.'s Signature                                                                  Date/Time                                                                                                                                              Patient or Energy Manager  Date/Time

## 2023-10-14 NOTE — Consults (Signed)
 Session ID: [id number]  Language: Spanish  Interpreter ID: #[id number]  Interpreter Name: Sherilyn Dacosta

## 2023-10-14 NOTE — Discharge Summary (Signed)
 Hospitalist Discharge Summary     Patient ID:    Melinda [name]  [id number]  43 y.o.  1981/07/19    Admit date of service: 09/25/2023    Discharge date of service: 10/14/2023    Admission Diagnoses: Epigastric pain [R10.13]  Acute renal insufficiency [N28.9]  Intractable nausea and vomiting [R11.2]  Nausea and vomiting, unspecified vomiting type [R11.2]  Intractable abdominal pain [R10.9]    Chronic Diagnoses:      Discharge Medications:   Current Discharge Medication List        START taking these medications    Details   busPIRone  (BUSPAR ) 15 MG tablet Take 15 mg by mouth 3 times daily  Qty: 30 tablet, Refills: 0      DULoxetine  (CYMBALTA ) 60 MG extended release capsule Take 1 capsule by mouth daily  Qty: 30 capsule, Refills: 3      clopidogrel  (PLAVIX ) 75 MG tablet Take 1 tablet by mouth daily  Qty: 30 tablet, Refills: 3      aspirin  81 MG chewable tablet Take 1 tablet by mouth daily  Qty: 30 tablet, Refills: 3           CONTINUE these medications which have NOT CHANGED    Details   linaclotide  (LINZESS ) 290 MCG CAPS capsule Take 1 capsule by mouth every morning (before breakfast)  Qty: 30 capsule, Refills: 0      metoclopramide  (REGLAN ) 10 MG tablet Take 1 tablet by mouth 4 times daily as needed (nausea, vomiting)  Qty: 90 tablet, Refills: 0      amitriptyline  (ELAVIL ) 10 MG tablet Take 1 tablet by mouth nightly  Qty: 90 tablet, Refills: 0      polyethylene glycol (GLYCOLAX ) 17 GM/SCOOP powder Take 17 g by mouth 2 times daily  Qty: 1530 g, Refills: 1      dicyclomine  (BENTYL ) 20 MG tablet Take 1 tablet by mouth every 6 hours as needed (abdominal cramping)  Qty: 90 tablet, Refills: 0      ondansetron  (ZOFRAN -ODT) 4 MG disintegrating tablet Take 1 tablet by mouth every 8 hours as needed for Nausea or Vomiting  Qty: 30 tablet, Refills: 0      pantoprazole  (PROTONIX ) 40 MG tablet Take 1 tablet by mouth 2 times daily (before meals)  Qty: 60 tablet, Refills: 1      lisinopril  (PRINIVIL ;ZESTRIL ) 40 MG tablet Take 1  tablet by mouth daily  Qty: 30 tablet, Refills: 2      metoprolol  succinate (TOPROL  XL) 50 MG extended release tablet Take 1 tablet by mouth daily  Qty: 30 tablet, Refills: 2      amLODIPine  (NORVASC ) 10 MG tablet Take 1 tablet by mouth daily  Qty: 30 tablet, Refills: 2             Follow up Care:    1. No follow-up provider specified. in 1-2 weeks  2. GI    Diet:  low fat, low cholesterol diet    Disposition:  Home.    Advanced Directive:    Discharge Exam:  See today's note.    CONSULTATIONS: GI    Significant Diagnostic Studies:   Recent Labs     10/13/23  0124 10/14/23  0602   WBC 6.0 5.8   HGB 7.1* 7.2*   HCT 22.0* 22.0*   PLT 385 466*     Recent Labs     10/12/23  0601 10/13/23  0124 10/14/23  0602   NA 135* 132* 134*   K 4.3  4.7 4.2   CL 101 101 102   CO2 30 29 28    BUN 17 26* 33*   MG 1.8 1.9 2.1   PHOS 4.8*  --  3.8     No results for input(s): ALT, TP, GLOB, GGT in the last 72 hours.    Invalid input(s): SGOT, GPT, AP, TBIL, TBILI, ALB, AML, AMYP, LPSE, HLPSE  No results for input(s): INR, APTT in the last 72 hours.    Invalid input(s): PTP   No results for input(s): TIBC in the last 72 hours.    Invalid input(s): FE, PSAT, FERR   No results for input(s): PH, PCO2, PO2 in the last 72 hours.  No results for input(s): CPK, CKMB, TROPONINI in the last 72 hours.  No results found for: GLUCPOC    Physical Exam:    Gen:  Well-developed, well-nourished, in no acute distress  HEENT:  Pink conjunctivae, PERRL, hearing intact to voice, moist mucous membranes  Neck:  Supple, without masses, thyroid non-tender  Resp:  No accessory muscle use, clear breath sounds without wheezes rales or rhonchi  Card:  No murmurs, normal S1, S2 without thrills, bruits or peripheral edema  Abd:  Soft, non-tender, non-distended, normoactive bowel sounds are present, no palpable organomegaly and no detectable hernias  Lymph:  No cervical or inguinal adenopathy  Musc:  No cyanosis or  clubbing  Skin:  No rashes or ulcers, skin turgor is good  Neuro:  Cranial nerves are grossly intact, no focal motor weakness, follows commands appropriately  Psych:  Good insight, oriented to person, place and time, alert      HOSPITAL COURSE & TREATMENT RENDERED:    Intractable abd pain/N/V: improved.  Started on full liquid diet, which she tolerated and advanced to low-fat diet.  Patient tolerated diet and wants to be discharged. EGD from 12/11 with gastritis. Prior gastric emptying study NEGATIVE for gastroparesis.  Normal upper GI series.  Off TPN. pancreatic fecal elastase 190, urine porphobilinogen WNL.  Evaluated by GI. complement levels and lead levels pending      2.   Severe distal SMA stenosis: seen on abd CTA. vascular performed angio/SMA stenting on 12/23. cont ASA, plavix .     3.  Acute pancreatitis: most recent CT showed resolution.  Symptoms has improved significantly.  Patient denies pain and abdominal is benign.     5.  HTN: Continue all home medication.     6.  AKI on CKD III -improving.  Avoid nephrotoxic drugs.       7.  Anasarca: due to IVF, low albumin , pancreatitis. prealbumin low which is suspected   -does have overt proteinuria on UA which is likely contributing; resume ARB once Cr stable      8.   DM type 2: A1C 7.4%.  Continue home insulin      9.   Anemia: iron  studies consistent with ACD.  Monitor     10.   Mood disorder: on buspar , cymbalta       11.   L arm swelling: likely from IVF, anasarca.  Dopplers negative for DVT   -monitor      12.   Substance dependence -had IV pain medicine around-the-clock.           Discharged in improved condition.    Spent 35 minutes    Signed:  Harmony Sandell, MD  10/14/2023  1:37 PM

## 2023-10-14 NOTE — Consults (Signed)
 Session ID: [id number]  Language: Spanish  Interpreter ID: #[id number]  Interpreter Name: Mickle Mallory

## 2023-10-17 ENCOUNTER — Ambulatory Visit: Payer: MEDICAID | Attending: Family Medicine | Primary: Adult Health

## 2023-10-19 LAB — COMPLEMENT, C1Q: Complement C1Q: 13.4 mg/dL (ref 10.3–20.5)

## 2023-10-21 ENCOUNTER — Inpatient Hospital Stay
Admission: EM | Admit: 2023-10-21 | Discharge: 2023-10-25 | Disposition: A | Discharge status: Home / Self Care | Payer: MEDICAID | Admitting: Student in an Organized Health Care Education/Training Program

## 2023-10-21 ENCOUNTER — Emergency Department: Admit: 2023-10-21 | Payer: MEDICAID | Primary: Adult Health

## 2023-10-21 DIAGNOSIS — R1013 Epigastric pain: Secondary | ICD-10-CM

## 2023-10-21 DIAGNOSIS — E1143 Type 2 diabetes mellitus with diabetic autonomic (poly)neuropathy: Principal | ICD-10-CM

## 2023-10-21 LAB — EKG 12-LEAD
Atrial Rate: 127 {beats}/min
P Axis: 61 degrees
P-R Interval: 126 ms
Q-T Interval: 348 ms
QRS Duration: 78 ms
QTc Calculation (Bazett): 505 ms
R Axis: 264 degrees
T Axis: 27 degrees
Ventricular Rate: 127 {beats}/min

## 2023-10-21 LAB — CBC WITH AUTO DIFFERENTIAL
Basophils %: 0.6 % (ref 0.0–1.0)
Basophils Absolute: 0.09 10*3/uL (ref 0.00–0.10)
Eosinophils %: 1 % (ref 0.0–7.0)
Eosinophils Absolute: 0.14 10*3/uL (ref 0.00–0.40)
Hematocrit: 30.3 % — ABNORMAL LOW (ref 35.0–47.0)
Hemoglobin: 10.2 g/dL — ABNORMAL LOW (ref 11.5–16.0)
Immature Granulocytes %: 0.4 % (ref 0.0–0.5)
Immature Granulocytes Absolute: 0.06 10*3/uL — ABNORMAL HIGH (ref 0.00–0.04)
Lymphocytes %: 10.5 % — ABNORMAL LOW (ref 12.0–49.0)
Lymphocytes Absolute: 1.51 10*3/uL (ref 0.80–3.50)
MCH: 28.9 pg (ref 26.0–34.0)
MCHC: 33.7 g/dL (ref 30.0–36.5)
MCV: 85.8 fL (ref 80.0–99.0)
MPV: 8.6 fL — ABNORMAL LOW (ref 8.9–12.9)
Monocytes %: 4.1 % — ABNORMAL LOW (ref 5.0–13.0)
Monocytes Absolute: 0.59 10*3/uL (ref 0.00–1.00)
Neutrophils %: 83.4 % — ABNORMAL HIGH (ref 32.0–75.0)
Neutrophils Absolute: 12.01 10*3/uL — ABNORMAL HIGH (ref 1.80–8.00)
Nucleated RBCs: 0 /100{WBCs}
Platelets: 619 10*3/uL — ABNORMAL HIGH (ref 150–400)
RBC: 3.53 M/uL — ABNORMAL LOW (ref 3.80–5.20)
RDW: 14.2 % (ref 11.5–14.5)
WBC: 14.4 10*3/uL — ABNORMAL HIGH (ref 3.6–11.0)
nRBC: 0 10*3/uL (ref 0.00–0.01)

## 2023-10-21 LAB — COMPREHENSIVE METABOLIC PANEL
ALT: 47 U/L (ref 12–78)
AST: 32 U/L (ref 15–37)
Albumin/Globulin Ratio: 0.7 — ABNORMAL LOW (ref 1.1–2.2)
Albumin: 3.1 g/dL — ABNORMAL LOW (ref 3.5–5.0)
Alk Phosphatase: 129 U/L — ABNORMAL HIGH (ref 45–117)
Anion Gap: 6 mmol/L (ref 2–12)
BUN/Creatinine Ratio: 14 (ref 12–20)
BUN: 26 mg/dL — ABNORMAL HIGH (ref 6–20)
CO2: 27 mmol/L (ref 21–32)
Calcium: 9.7 mg/dL (ref 8.5–10.1)
Chloride: 107 mmol/L (ref 97–108)
Creatinine: 1.84 mg/dL — ABNORMAL HIGH (ref 0.55–1.02)
Est, Glom Filt Rate: 35 mL/min/{1.73_m2} — ABNORMAL LOW (ref >60)
Globulin: 4.4 g/dL — ABNORMAL HIGH (ref 2.0–4.0)
Glucose: 222 mg/dL — ABNORMAL HIGH (ref 65–100)
Potassium: 3.4 mmol/L — ABNORMAL LOW (ref 3.5–5.1)
Sodium: 140 mmol/L (ref 136–145)
Total Bilirubin: 0.3 mg/dL (ref 0.2–1.0)
Total Protein: 7.5 g/dL (ref 6.4–8.2)

## 2023-10-21 LAB — EXTRA TUBES HOLD

## 2023-10-21 LAB — LIPASE: Lipase: 18 U/L (ref 13–75)

## 2023-10-21 LAB — MAGNESIUM: Magnesium: 1.9 mg/dL (ref 1.6–2.4)

## 2023-10-21 MED ORDER — NORMAL SALINE FLUSH 0.9 % IV SOLN
0.9 % | Freq: Two times a day (BID) | INTRAVENOUS | Status: AC
Start: 2023-10-21 — End: 2023-10-25
  Administered 2023-10-21 – 2023-10-25 (×8): 10 mL via INTRAVENOUS

## 2023-10-21 MED ORDER — POLYETHYLENE GLYCOL 3350 17 G PO PACK
17 g | Freq: Every day | ORAL | Status: AC | PRN
Start: 2023-10-21 — End: 2023-10-25

## 2023-10-21 MED ORDER — AMLODIPINE BESYLATE 5 MG PO TABS
5 MG | Freq: Every day | ORAL | Status: AC
Start: 2023-10-21 — End: 2023-10-25
  Administered 2023-10-22 – 2023-10-25 (×3): 10 mg via ORAL

## 2023-10-21 MED ORDER — PROCHLORPERAZINE EDISYLATE 10 MG/2ML IJ SOLN
10 | Freq: Four times a day (QID) | INTRAMUSCULAR | Status: DC | PRN
Start: 2023-10-21 — End: 2023-10-21

## 2023-10-21 MED ORDER — POTASSIUM CHLORIDE 10 MEQ/100ML IV SOLN
10100 MEQ/0ML | INTRAVENOUS | Status: AC | PRN
Start: 2023-10-21 — End: 2023-10-25

## 2023-10-21 MED ORDER — SODIUM CHLORIDE 0.9 % IV SOLN
0.9 | INTRAVENOUS | Status: AC
Start: 2023-10-21 — End: 2023-10-22
  Administered 2023-10-21 – 2023-10-22 (×2): via INTRAVENOUS

## 2023-10-21 MED ORDER — NORMAL SALINE FLUSH 0.9 % IV SOLN
0.9 % | INTRAVENOUS | Status: AC | PRN
Start: 2023-10-21 — End: 2023-10-25

## 2023-10-21 MED ORDER — HYDRALAZINE HCL 20 MG/ML IJ SOLN
20 | Freq: Once | INTRAMUSCULAR | Status: AC
Start: 2023-10-21 — End: 2023-10-21
  Administered 2023-10-21: 13:00:00 10 mg via INTRAVENOUS

## 2023-10-21 MED ORDER — BUSPIRONE HCL 10 MG PO TABS
10 MG | Freq: Three times a day (TID) | ORAL | Status: AC
Start: 2023-10-21 — End: 2023-10-25
  Administered 2023-10-21 – 2023-10-23 (×6): 15 mg via ORAL

## 2023-10-21 MED ORDER — CLOPIDOGREL BISULFATE 75 MG PO TABS
75 MG | Freq: Every day | ORAL | Status: AC
Start: 2023-10-21 — End: 2023-10-25
  Administered 2023-10-22 – 2023-10-23 (×2): 75 mg via ORAL

## 2023-10-21 MED ORDER — HALOPERIDOL LACTATE 5 MG/ML IJ SOLN
5 | Freq: Once | INTRAMUSCULAR | Status: AC
Start: 2023-10-21 — End: 2023-10-21
  Administered 2023-10-21: 12:00:00 5 mg via INTRAVENOUS

## 2023-10-21 MED ORDER — DICYCLOMINE HCL 20 MG PO TABS
20 MG | Freq: Four times a day (QID) | ORAL | Status: AC | PRN
Start: 2023-10-21 — End: 2023-10-25
  Administered 2023-10-23 – 2023-10-24 (×3): 20 mg via ORAL

## 2023-10-21 MED ORDER — SODIUM CHLORIDE 0.9 % IV BOLUS
0.9 | Freq: Once | INTRAVENOUS | Status: AC
Start: 2023-10-21 — End: 2023-10-21
  Administered 2023-10-21: 11:00:00 1000 mL via INTRAVENOUS

## 2023-10-21 MED ORDER — POTASSIUM BICARB-CITRIC ACID 20 MEQ PO TBEF
20 MEQ | ORAL | Status: AC | PRN
Start: 2023-10-21 — End: 2023-10-25

## 2023-10-21 MED ORDER — ENOXAPARIN SODIUM 40 MG/0.4ML IJ SOSY
40 | Freq: Every day | INTRAMUSCULAR | Status: DC
Start: 2023-10-21 — End: 2023-10-23
  Administered 2023-10-21 – 2023-10-23 (×2): 40 mg via SUBCUTANEOUS

## 2023-10-21 MED ORDER — LISINOPRIL 20 MG PO TABS
20 MG | Freq: Every day | ORAL | Status: AC
Start: 2023-10-21 — End: 2023-10-25
  Administered 2023-10-22 – 2023-10-25 (×3): 40 mg via ORAL

## 2023-10-21 MED ORDER — DIPHENHYDRAMINE HCL 50 MG/ML IJ SOLN
50 | Freq: Once | INTRAMUSCULAR | Status: AC
Start: 2023-10-21 — End: 2023-10-21
  Administered 2023-10-21: 16:00:00 12.5 mg via INTRAVENOUS

## 2023-10-21 MED ORDER — ASPIRIN 81 MG PO CHEW
81 MG | Freq: Every day | ORAL | Status: AC
Start: 2023-10-21 — End: 2023-10-25
  Administered 2023-10-21 – 2023-10-23 (×3): 81 mg via ORAL

## 2023-10-21 MED ORDER — DULOXETINE HCL 30 MG PO CPEP
30 MG | Freq: Every day | ORAL | Status: AC
Start: 2023-10-21 — End: 2023-10-25
  Administered 2023-10-22 – 2023-10-23 (×2): 60 mg via ORAL

## 2023-10-21 MED ORDER — ACETAMINOPHEN 650 MG RE SUPP
650 | Freq: Four times a day (QID) | RECTAL | Status: DC | PRN
Start: 2023-10-21 — End: 2023-10-25

## 2023-10-21 MED ORDER — SODIUM CHLORIDE 0.9 % IV SOLN
0.9 % | INTRAVENOUS | Status: AC | PRN
Start: 2023-10-21 — End: 2023-10-25
  Administered 2023-10-23 – 2023-10-24 (×3): via INTRAVENOUS

## 2023-10-21 MED ORDER — SODIUM CHLORIDE (PF) 0.9 % IJ SOLN
0.9 | Freq: Every day | INTRAMUSCULAR | Status: DC
Start: 2023-10-21 — End: 2023-10-25
  Administered 2023-10-21 – 2023-10-25 (×5): 40 mg via INTRAVENOUS

## 2023-10-21 MED ORDER — HALOPERIDOL LACTATE 5 MG/ML IJ SOLN
5 | Freq: Once | INTRAMUSCULAR | Status: AC
Start: 2023-10-21 — End: 2023-10-21
  Administered 2023-10-21: 16:00:00 2 mg via INTRAVENOUS

## 2023-10-21 MED ORDER — ONDANSETRON HCL 4 MG/2ML IJ SOLN
4 | Freq: Four times a day (QID) | INTRAMUSCULAR | Status: DC | PRN
Start: 2023-10-21 — End: 2023-10-21

## 2023-10-21 MED ORDER — ONDANSETRON 4 MG PO TBDP
4 | Freq: Three times a day (TID) | ORAL | Status: DC | PRN
Start: 2023-10-21 — End: 2023-10-21

## 2023-10-21 MED ORDER — LORAZEPAM 2 MG/ML IJ SOLN
2 | Freq: Once | INTRAMUSCULAR | Status: AC
Start: 2023-10-21 — End: 2023-10-21
  Administered 2023-10-21: 16:00:00 1 mg via INTRAVENOUS

## 2023-10-21 MED ORDER — ACETAMINOPHEN 325 MG PO TABS
325 | Freq: Four times a day (QID) | ORAL | Status: DC | PRN
Start: 2023-10-21 — End: 2023-10-25
  Administered 2023-10-22 – 2023-10-24 (×5): 650 mg via ORAL

## 2023-10-21 MED ORDER — ONDANSETRON HCL 4 MG/2ML IJ SOLN
4 | Freq: Once | INTRAMUSCULAR | Status: AC
Start: 2023-10-21 — End: 2023-10-21
  Administered 2023-10-21: 11:00:00 4 mg via INTRAVENOUS

## 2023-10-21 MED ORDER — LINACLOTIDE 145 MCG PO CAPS
145 MCG | Freq: Every day | ORAL | Status: AC
Start: 2023-10-21 — End: 2023-10-25
  Administered 2023-10-22 – 2023-10-25 (×4): 290 ug via ORAL

## 2023-10-21 MED ORDER — METOPROLOL SUCCINATE ER 50 MG PO TB24
50 MG | Freq: Every day | ORAL | Status: AC
Start: 2023-10-21 — End: 2023-10-25
  Administered 2023-10-22 – 2023-10-25 (×3): 50 mg via ORAL

## 2023-10-21 MED ORDER — MORPHINE SULFATE (PF) 4 MG/ML IJ SOLN
4 | INTRAMUSCULAR | Status: AC
Start: 2023-10-21 — End: 2023-10-21
  Administered 2023-10-21: 11:00:00 4 mg via INTRAVENOUS

## 2023-10-21 MED ORDER — MAGNESIUM SULFATE 2000 MG/50 ML IVPB PREMIX
2 | Freq: Once | INTRAVENOUS | Status: AC
Start: 2023-10-21 — End: 2023-10-21
  Administered 2023-10-21: 13:00:00 2000 mg via INTRAVENOUS

## 2023-10-21 MED ORDER — POTASSIUM CHLORIDE ER 10 MEQ PO TBCR
10 MEQ | ORAL | Status: AC | PRN
Start: 2023-10-21 — End: 2023-10-25

## 2023-10-21 MED ORDER — METOCLOPRAMIDE HCL 5 MG/ML IJ SOLN
5 | Freq: Four times a day (QID) | INTRAMUSCULAR | Status: DC
Start: 2023-10-21 — End: 2023-10-22
  Administered 2023-10-21 – 2023-10-22 (×5): 5 mg via INTRAVENOUS

## 2023-10-21 MED ORDER — MAGNESIUM SULFATE 2000 MG/50 ML IVPB PREMIX
250 GM/50ML | INTRAVENOUS | Status: AC | PRN
Start: 2023-10-21 — End: 2023-10-25

## 2023-10-21 MED FILL — BD POSIFLUSH 0.9 % IV SOLN: 0.9 % | INTRAVENOUS | Qty: 40

## 2023-10-21 MED FILL — SODIUM CHLORIDE 0.9 % IV SOLN: 0.9 % | INTRAVENOUS | Qty: 1000

## 2023-10-21 MED FILL — LORAZEPAM 2 MG/ML IJ SOLN: 2 MG/ML | INTRAMUSCULAR | Qty: 1

## 2023-10-21 MED FILL — MAGNESIUM SULFATE 2 GM/50ML IV SOLN: 2 GM/50ML | INTRAVENOUS | Qty: 50

## 2023-10-21 MED FILL — HALOPERIDOL LACTATE 5 MG/ML IJ SOLN: 5 MG/ML | INTRAMUSCULAR | Qty: 1

## 2023-10-21 MED FILL — METOCLOPRAMIDE HCL 5 MG/ML IJ SOLN: 5 MG/ML | INTRAMUSCULAR | Qty: 2

## 2023-10-21 MED FILL — PANTOPRAZOLE SODIUM 40 MG IV SOLR: 40 MG | INTRAVENOUS | Qty: 40

## 2023-10-21 MED FILL — ENOXAPARIN SODIUM 40 MG/0.4ML IJ SOSY: 40 MG/0.4ML | INTRAMUSCULAR | Qty: 0.4

## 2023-10-21 MED FILL — HYDRALAZINE HCL 20 MG/ML IJ SOLN: 20 MG/ML | INTRAMUSCULAR | Qty: 1

## 2023-10-21 MED FILL — MORPHINE SULFATE 4 MG/ML IJ SOLN: 4 mg/mL | INTRAMUSCULAR | Qty: 1

## 2023-10-21 MED FILL — ASPIRIN LOW DOSE 81 MG PO CHEW: 81 MG | ORAL | Qty: 1

## 2023-10-21 MED FILL — ONDANSETRON HCL 4 MG/2ML IJ SOLN: 4 MG/2ML | INTRAMUSCULAR | Qty: 2

## 2023-10-21 MED FILL — BUSPIRONE HCL 15 MG PO TABS: 15 MG | ORAL | Qty: 1

## 2023-10-21 MED FILL — DIPHENHYDRAMINE HCL 50 MG/ML IJ SOLN: 50 MG/ML | INTRAMUSCULAR | Qty: 1

## 2023-10-21 NOTE — ED Provider Notes (Signed)
[facility] EMERGENCY DEPARTMENT  EMERGENCY DEPARTMENT ENCOUNTER      Pt Name: Melinda [name]  MRN: [id number]  Birthdate 10/04/1981  Date of evaluation: 10/21/2023  Provider: Ledell Peoples, MD    CHIEF COMPLAINT       Chief Complaint   Patient presents with    Vomiting    Abdominal Pain       EMERGENCY DEPARTMENT COURSE and DIFFERENTIAL DIAGNOSIS/MDM:   Medical Decision Making  43 year old female presenting to the ER with epigastric abdominal pain and multiple episodes of vomiting.  Patient has history of hospitalizations for intractable nausea vomiting.  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  Patient also has history of pancreatitis.  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  Patient also has history of gastroparesis.  No reporting blood in stool no diarrhea.  No lower abdominal pain.  No reported fevers.  Vomiting started earlier in the day.  Has been unable to tolerate p.o. intake.  On exam patient is tachycardic and hypertensive.  Epigastric pain.  Patient hypertensive and tachycardic.  Having multiple episodes of vomiting and dry heaving on physical exam with acute distress.  No lower abdominal pain no guarding or rigidity.  Ordered IV fluids and antiemetics pain medications.  Question of possible withdrawal.  At this time I have low suspicion for occlusion of stent as patient has nonacute abdomen.  However do not feel that checking lactate would be beneficial it is likely elevated due to dehydration.  Patient still vomiting despite antiemetics.  Will give IV Haldol.  After which time this seemed to improve patient's vomiting and hypertension patient also received hydralazine for blood pressure.  Patient signed out to Dr. Sandi Mariscal pending labs and CT abdomen pelvis and reevaluation and disposition.    Amount and/or Complexity of Data Reviewed  Labs: ordered.  Radiology: ordered.  ECG/medicine tests:  ordered.    Risk  Prescription drug management.  Decision regarding hospitalization.            REASSESSMENT     ED Course as of 10/21/23 0742   Fri Oct 21, 2023   0718 EKG: Sinus tachycardia with right axis deviation minimal LVH criteria.  No ST elevation or depressions.  QTc 505 ms.  EKG interpreted by Dr. Caren Macadam [ZD]   [phone] Ordered IV hydralazine for patient's elevated blood pressure.  Review of medical records patient is on methadone. [ZD]      ED Course User Index  [ZD] Ledell Peoples, MD         HISTORY OF PRESENT ILLNESS        43 year old female presenting to the ER with epigastric abdominal pain and multiple episodes of vomiting.         Nursing Notes were reviewed.    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)     Gastroparesis     Gastroparesis     GERD (gastroesophageal reflux disease)     High cholesterol     Hypertension          SURGICAL HISTORY       Past Surgical History:   Procedure Laterality Date    CAPSULE ENDOSCOPY N/A 01/20/2023  ESOPHAGEAL CAPSULE ENDOSCOPY remove at 1624PM performed by Glyn Ade, MD at [facility] ENDOSCOPY    CHOLECYSTECTOMY, LAPAROSCOPIC N/A 02/03/2023    ROBOTIC LAPAROSCOPIC CHOLECYSTECTOMY with Indocyanine green performed by Verdis Frederickson, MD at [facility] MAIN OR    COLONOSCOPY N/A 01/19/2023    COLONOSCOPY DIAGNOSTIC performed by Glyn Ade, MD at [facility] ENDOSCOPY    INVASIVE VASCULAR N/A 10/03/2023    Angiography visceral SMA performed by Natasha Mead, MD at [facility] CARDIAC CATH LAB    INVASIVE VASCULAR N/A 10/03/2023    Ultrasound guided vascular access performed by Natasha Mead, MD at [facility] CARDIAC CATH LAB    INVASIVE VASCULAR N/A 10/03/2023    Insert stent peripheral artery performed by Natasha Mead, MD at [facility] CARDIAC CATH LAB    IR NONTUNNELED VASCULAR CATHETER > 5 YEARS  10/10/2023    IR NONTUNNELED VASCULAR CATHETER > 5  YEARS 10/10/2023 [facility] CARDIAC CATH/EP/IR LAB    IR NONTUNNELED VASCULAR CATHETER > 5 YEARS  10/10/2023    IR NONTUNNELED VASCULAR CATHETER > 5 YEARS 10/10/2023 [facility] CARDIAC CATH/EP/IR LAB    OTHER SURGICAL HISTORY Left     Rentia attachment    TUBAL LIGATION Bilateral     UPPER GASTROINTESTINAL ENDOSCOPY N/A 01/17/2023    ESOPHAGOGASTRODUODENOSCOPY performed by Glyn Ade, MD at [facility] ENDOSCOPY    UPPER GASTROINTESTINAL ENDOSCOPY N/A 01/17/2023    ESOPHAGOGASTRODUODENOSCOPY BIOPSY performed by Glyn Ade, MD at [facility] ENDOSCOPY    UPPER GASTROINTESTINAL ENDOSCOPY N/A 01/18/2023    ESOPHAGOGASTRODUODENOSCOPY performed by Glyn Ade, MD at [facility] ENDOSCOPY    UPPER GASTROINTESTINAL ENDOSCOPY N/A 05/13/2023    ESOPHAGOGASTRODUODENOSCOPY performed by Leta Speller, MD at [facility] ENDOSCOPY    UPPER GASTROINTESTINAL ENDOSCOPY N/A 05/13/2023    ESOPHAGOGASTRODUODENOSCOPY BIOPSY performed by Leta Speller, MD at [facility] ENDOSCOPY    UPPER GASTROINTESTINAL ENDOSCOPY N/A 09/21/2023    ESOPHAGOGASTRODUODENOSCOPY performed by Glyn Ade, MD at [facility] ENDOSCOPY    UPPER GASTROINTESTINAL ENDOSCOPY N/A 09/21/2023    ESOPHAGOGASTRODUODENOSCOPY BIOPSY performed by Glyn Ade, MD at [facility] ENDOSCOPY    [place] ABSCESS DRAINAGE PERITONEAL/RETROPERITONEAL PERC  02/11/2023    [place] ABSCESS DRAINAGE PERITONEAL 02/11/2023 [facility] [place]         CURRENT MEDICATIONS       Previous Medications    AMITRIPTYLINE (ELAVIL) 10 MG TABLET    Take 1 tablet by mouth nightly    AMLODIPINE (NORVASC) 10 MG TABLET    Take 1 tablet by mouth daily    ASPIRIN 81 MG CHEWABLE TABLET    Take 1 tablet by mouth daily    BUSPIRONE (BUSPAR) 15 MG TABLET    Take 15 mg by mouth 3 times daily    CLOPIDOGREL (PLAVIX) 75 MG TABLET    Take 1 tablet by mouth daily    DICYCLOMINE (BENTYL) 20 MG TABLET    Take 1 tablet by mouth every 6 hours as needed (abdominal cramping)    DULOXETINE (CYMBALTA) 60 MG EXTENDED RELEASE CAPSULE    Take 1 capsule by mouth daily    LINACLOTIDE (LINZESS) 290 MCG  CAPS CAPSULE    Take 1 capsule by mouth every morning (before breakfast)    LISINOPRIL (PRINIVIL;ZESTRIL) 40 MG TABLET    Take 1 tablet by mouth daily    METOCLOPRAMIDE (REGLAN) 10 MG TABLET    Take 1 tablet by mouth 4 times daily as needed (nausea, vomiting)    METOPROLOL SUCCINATE (TOPROL XL) 50 MG EXTENDED RELEASE TABLET    Take 1  tablet by mouth daily    ONDANSETRON (ZOFRAN-ODT) 4 MG DISINTEGRATING TABLET    Take 1 tablet by mouth every 8 hours as needed for Nausea or Vomiting    PANTOPRAZOLE (PROTONIX) 40 MG TABLET    Take 1 tablet by mouth 2 times daily (before meals)    POLYETHYLENE GLYCOL (GLYCOLAX) 17 GM/SCOOP POWDER    Take 17 g by mouth 2 times daily       ALLERGIES     Patient has no known allergies.    FAMILY HISTORY     No family history on file.       SOCIAL HISTORY       Social History     Socioeconomic History    Marital status: Married   Tobacco Use    Smoking status: Never    Smokeless tobacco: Never   Vaping Use    Vaping status: Never Used   Substance and Sexual Activity    Alcohol use: Never    Drug use: Never    Sexual activity: Defer     Social Determinants of Health     Food Insecurity: No Food Insecurity (09/25/2023)    Hunger Vital Sign     Worried About Running Out of Food in the Last Year: Never true     Ran Out of Food in the Last Year: Never true   Transportation Needs: Unmet Transportation Needs (09/25/2023)    PRAPARE - Therapist, art (Medical): Yes     Lack of Transportation (Non-Medical): No   Housing Stability: Low Risk  (09/25/2023)    Housing Stability Vital Sign     Unable to Pay for Housing in the Last Year: No     Number of Times Moved in the Last Year: 0     Homeless in the Last Year: No       SCREENINGS         Glasgow Coma Scale  Eye Opening: Spontaneous  Best Verbal Response: Oriented  Best Motor Response: Obeys commands  Glasgow Coma Scale Score: 15                     CIWA Assessment  BP: (!) 209/125  Pulse: (!) 137                 PHYSICAL  EXAM       Vitals:    10/21/23 0556   BP: (!) 209/125   Pulse: (!) 137   Resp: 25   Temp: 97.3 F (36.3 C)   TempSrc: Axillary   SpO2: 98%   Weight: 55.2 kg (121 lb 11.1 oz)       Body mass index is 24.57 kg/m.    Physical Exam  Constitutional:       General: She is in acute distress.      Appearance: Normal appearance.      Comments: Actively vomiting and dry heaving   HENT:      Head: Normocephalic.      Mouth/Throat:      Mouth: Mucous membranes are dry.   Eyes:      Conjunctiva/sclera: Conjunctivae normal.   Cardiovascular:      Rate and Rhythm: Tachycardia present.   Abdominal:      General: There is no distension.      Palpations: Abdomen is soft.      Tenderness: There is abdominal tenderness in the epigastric area. There is no right CVA tenderness, left CVA  tenderness, guarding or rebound. Negative signs include Murphy's sign and McBurney's sign.      Hernia: No hernia is present.   Musculoskeletal:         General: Normal range of motion.      Cervical back: Neck supple.   Skin:     General: Skin is warm.      Capillary Refill: Capillary refill takes less than 2 seconds.   Neurological:      General: No focal deficit present.      Mental Status: She is alert and oriented to person, place, and time.         DIAGNOSTIC RESULTS     RADIOLOGY:   Interpretation per the Radiologist below, if available at the time of this note:    No orders to display        LABS:    No results found for this visit on 10/21/23.       CONSULTS:  None    PROCEDURES:  Unless otherwise noted below, none     Procedures      FINAL IMPRESSION    No diagnosis found.      DISPOSITION/PLAN   DISPOSITION        PATIENT REFERRED TO:  No follow-up provider specified.    DISCHARGE MEDICATIONS:  New Prescriptions    No medications on file         (Please note that portions of this note were completed with a voice recognition program.  Efforts were made to edit the dictations but occasionally words are mis-transcribed.)    Ledell Peoples, MD  (electronically signed)  Emergency Attending Physician            Ledell Peoples, MD  10/21/23 2009

## 2023-10-21 NOTE — Progress Notes (Signed)
[facility] Pharmacy Dosing Services    Metoclopramide was automatically dose-adjusted per [facility] P&T Committee Protocol, with respect to renal function.      Assessment/Plan: Estimated Creatinine Clearance: 31 mL/min (A) (based on SCr of 1.84 mg/dL (H)). Metoclopramide changed to 5 mg every 6 hours per P&T approved protocol for patient with CrCl 10-60 mL/min.    Creatinine Clearance Estimated Creatinine Clearance: 31 mL/min (A) (based on SCr of 1.84 mg/dL (H)).   BUN Lab Results   Component Value Date/Time    BUN 26 10/21/2023 06:18 AM         Pharmacist Volanda Napoleon, RPH

## 2023-10-21 NOTE — ED Notes (Signed)
Report on pt called to [facility] RN pt, to be transferred to [facility] for routine progression of care

## 2023-10-21 NOTE — ED Triage Notes (Signed)
Patient ambulatory to Triage with c/o nausea, vomiting and abdominal pain    Patient has a hx of gastroparesis

## 2023-10-21 NOTE — Progress Notes (Signed)
Provided interpreter services remotely to Dr. Elonda Husky during patient assessment.  Opportunity for questions and clarification was provided.            Lynne Logan, CMI  [facility] Senior Interpreter - Navigator  [phone]

## 2023-10-21 NOTE — ED Notes (Signed)
ED SIGN OUT NOTE  Care assumed at [facility]. [facility] 7:45 AM EST    Patient was signed out to me by Dr. Miles Costain.     Patient is awaiting completion of work-up and reevaluation after medications.    CT of the abdomen pelvis without contrast negative for acute abdominal process.      8:30 AM  Patient was subsequently sleeping after IV Haldol, blood pressure improving to 170/80's and HR 115's. Will attempt PO trial.    10:00 AM  After p.o. trial, patient began vomiting again.  Pain returned.  Blood pressure increasing as well as heart rate.  Will give additional IV Haldol with Benadryl and Ativan.  Plan will be for admission for intractable nausea and vomiting and pain.    Perfect Serve Consult for Admission  10:01 AM    ED Room Number: ERA/A  Patient Name and age:  Melinda [name] 43 y.o.  female  Working Diagnosis:   1. Epigastric pain    2. Intractable nausea and vomiting    3. 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 Had improvement after IV haldol but after PO trial symptoms returned. Current BP 187/103    BP (!) 221/112   Pulse (!) 107   Temp 97.3 F (36.3 C) (Axillary)   Resp 10   Wt 55.2 kg (121 lb 11.1 oz)   SpO2 98%   BMI 24.57 kg/m     Labs Reviewed   CBC WITH AUTO DIFFERENTIAL - Abnormal; Notable for the following components:       Result Value    WBC 14.4 (*)     RBC 3.53 (*)     Hemoglobin 10.2 (*)     Hematocrit 30.3 (*)     Platelets 619 (*)     MPV 8.6 (*)     Neutrophils % 83.4 (*)     Lymphocytes % 10.5 (*)     Monocytes % 4.1 (*)     Neutrophils Absolute 12.01 (*)     Immature Granulocytes Absolute 0.06 (*)     All other  components within normal limits   COMPREHENSIVE METABOLIC PANEL - Abnormal; Notable for the following components:    Potassium 3.4 (*)     Glucose 222 (*)     BUN 26 (*)     Creatinine 1.84 (*)     Est, Glom Filt Rate 35 (*)     Alk Phosphatase 129 (*)     Albumin 3.1 (*)     Globulin 4.4 (*)     Albumin/Globulin Ratio 0.7 (*)     All other components within normal limits   EXTRA TUBES HOLD   LIPASE   MAGNESIUM   URINALYSIS WITH MICROSCOPIC     CT ABDOMEN PELVIS WO CONTRAST Additional Contrast? None   Final Result   No renal or ureteral stone or evidence of hydronephrosis. No acute abnormality.      Electronically signed by Carola Rhine          ED Course as of 10/21/23 1001   Fri Oct 21, 2023  4540 EKG: Sinus tachycardia with right axis deviation minimal LVH criteria.  No ST elevation or depressions.  QTc 505 ms.  EKG interpreted by Dr. Caren Macadam [ZD]   [phone] Ordered IV hydralazine for patient's elevated blood pressure.  Review of medical records patient is on methadone. [ZD]      ED Course User Index  [ZD] Ledell Peoples, MD       Diagnosis:   1. Epigastric pain    2. Intractable nausea and vomiting    3. Uncontrolled hypertension        Disposition:   Decision To Admit 10/21/2023 10:01:07 AM      Ellsworth Lennox, DO      Ellsworth Lennox, DO  10/21/23 1007

## 2023-10-21 NOTE — ED Notes (Signed)
Report given Maisie Fus, RN.

## 2023-10-21 NOTE — Progress Notes (Signed)
Patient arrived on the unit sleepy from the nausea medications. Patient was not alert enough to complete admission documentation. Patient initial bp was elevated patient was balled up and would not stretch out legs or arms.

## 2023-10-21 NOTE — Consults (Signed)
Please see H&P

## 2023-10-21 NOTE — ED Notes (Signed)
Pt failed po challenge. Pt started vomiting again with abdominal pain. MD notified. Pt unable to urinate at this time.

## 2023-10-21 NOTE — H&P (Addendum)
[place] [facility]  [address] [place], [place] [address]  [phone]    [place] [facility]    History & Physical    Date of service: 10/21/2023    Patient name: Melinda [name]  MRN: [id number]  Date of birth: 1981/01/28  Age: 43 y.o.     Primary care provider:  Orlean Bradford, APRN - NP     Source of Information: patient, medical records                               Chief complain: Nausea vomiting and abdominal pain    History of present illness  Melinda [name] is a 43 y.o. female who presented with recurrent nausea vomiting and abdominal pain.  Symptoms are the same as the last 2 incidences when she was admitted.  Patient is currently extremely drowsy after receiving Haldol and is unable to provide any reliable history even with the use of a translator.  She was feeling good after being discharged on 1/3 after an extensive evaluation for her symptoms.  She started with recurrent nausea vomiting and abdominal pain yesterday evening.  She was unable to tolerate a p.o. challenge in the emergency department.  Uncertain whether she has been compliant with medications prescribed on discharge.    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)     Gastroparesis     Gastroparesis     GERD (gastroesophageal reflux disease)     High cholesterol     Hypertension       Past Surgical History:   Procedure Laterality Date    CAPSULE ENDOSCOPY N/A 01/20/2023    ESOPHAGEAL CAPSULE ENDOSCOPY remove at 1624PM performed by Glyn Ade, MD at [facility] ENDOSCOPY    CHOLECYSTECTOMY, LAPAROSCOPIC N/A 02/03/2023    ROBOTIC LAPAROSCOPIC CHOLECYSTECTOMY with Indocyanine green performed by Verdis Frederickson, MD at [facility] MAIN OR    COLONOSCOPY N/A 01/19/2023    COLONOSCOPY DIAGNOSTIC performed by Glyn Ade, MD at [facility] ENDOSCOPY    INVASIVE VASCULAR N/A 10/03/2023    Angiography visceral SMA  performed by Natasha Mead, MD at [facility] CARDIAC CATH LAB    INVASIVE VASCULAR N/A 10/03/2023    Ultrasound guided vascular access performed by Natasha Mead, MD at [facility] CARDIAC CATH LAB    INVASIVE VASCULAR N/A 10/03/2023    Insert stent peripheral artery performed by Natasha Mead, MD at [facility] CARDIAC CATH LAB    IR NONTUNNELED VASCULAR CATHETER > 5 YEARS  10/10/2023    IR NONTUNNELED VASCULAR CATHETER > 5 YEARS 10/10/2023 [facility] CARDIAC CATH/EP/IR LAB    IR NONTUNNELED VASCULAR CATHETER > 5 YEARS  10/10/2023    IR NONTUNNELED VASCULAR CATHETER > 5 YEARS 10/10/2023 [facility] CARDIAC CATH/EP/IR LAB    OTHER SURGICAL HISTORY Left     Rentia attachment    TUBAL LIGATION Bilateral     UPPER GASTROINTESTINAL ENDOSCOPY N/A 01/17/2023    ESOPHAGOGASTRODUODENOSCOPY performed by Glyn Ade, MD at [facility] ENDOSCOPY    UPPER GASTROINTESTINAL ENDOSCOPY N/A 01/17/2023    ESOPHAGOGASTRODUODENOSCOPY BIOPSY performed by Glyn Ade, MD at [facility] ENDOSCOPY    UPPER GASTROINTESTINAL ENDOSCOPY N/A 01/18/2023    ESOPHAGOGASTRODUODENOSCOPY performed by Glyn Ade, MD at [facility] ENDOSCOPY    UPPER GASTROINTESTINAL ENDOSCOPY N/A 05/13/2023    ESOPHAGOGASTRODUODENOSCOPY  performed by Leta Speller, MD at [facility] ENDOSCOPY    UPPER GASTROINTESTINAL ENDOSCOPY N/A 05/13/2023    ESOPHAGOGASTRODUODENOSCOPY BIOPSY performed by Leta Speller, MD at [facility] ENDOSCOPY    UPPER GASTROINTESTINAL ENDOSCOPY N/A 09/21/2023    ESOPHAGOGASTRODUODENOSCOPY performed by Glyn Ade, MD at [facility] ENDOSCOPY    UPPER GASTROINTESTINAL ENDOSCOPY N/A 09/21/2023    ESOPHAGOGASTRODUODENOSCOPY BIOPSY performed by Glyn Ade, MD at [facility] ENDOSCOPY    [place] ABSCESS DRAINAGE PERITONEAL/RETROPERITONEAL PERC  02/11/2023    [place] ABSCESS DRAINAGE PERITONEAL 02/11/2023 [facility] [place]     Prior to Admission medications    Medication Sig Start Date End Date Taking? Authorizing Provider   busPIRone (BUSPAR) 15 MG tablet Take 15 mg by mouth 3 times daily 10/14/23   Tefera, Mesfin A, MD   DULoxetine (CYMBALTA)  60 MG extended release capsule Take 1 capsule by mouth daily 10/15/23   Tefera, Mesfin A, MD   clopidogrel (PLAVIX) 75 MG tablet Take 1 tablet by mouth daily 10/15/23   Tefera, Mesfin A, MD   aspirin 81 MG chewable tablet Take 1 tablet by mouth daily 10/15/23   Tefera, Mesfin A, MD   linaclotide (LINZESS) 290 MCG CAPS capsule Take 1 capsule by mouth every morning (before breakfast) 09/23/23   Muncy, Madelaine Bhat, MD   metoclopramide (REGLAN) 10 MG tablet Take 1 tablet by mouth 4 times daily as needed (nausea, vomiting) 09/14/23 10/14/23  Lyell, Arlington Calix, MD   amitriptyline (ELAVIL) 10 MG tablet Take 1 tablet by mouth nightly 09/14/23 12/13/23  Lyell, Arlington Calix, MD   polyethylene glycol [facility]) 17 GM/SCOOP powder Take 17 g by mouth 2 times daily 09/14/23 03/12/24  Lyell, Arlington Calix, MD   dicyclomine (BENTYL) 20 MG tablet Take 1 tablet by mouth every 6 hours as needed (abdominal cramping) 09/14/23   Lyell, Arlington Calix, MD   ondansetron (ZOFRAN-ODT) 4 MG disintegrating tablet Take 1 tablet by mouth every 8 hours as needed for Nausea or Vomiting 05/29/23   Renelda Loma, MD   pantoprazole (PROTONIX) 40 MG tablet Take 1 tablet by mouth 2 times daily (before meals) 05/29/23   Renelda Loma, MD   lisinopril (PRINIVIL;ZESTRIL) 40 MG tablet Take 1 tablet by mouth daily 03/23/23   Do, Khoi B, MD   metoprolol succinate (TOPROL XL) 50 MG extended release tablet Take 1 tablet by mouth daily 03/23/23   Do, Khoi B, MD   amLODIPine (NORVASC) 10 MG tablet Take 1 tablet by mouth daily 03/23/23   Do, Khoi B, MD     No Known Allergies   No family history on file.      Social history    Social History     Tobacco Use   Smoking Status Never   Smokeless Tobacco Never       Social History     Substance and Sexual Activity   Alcohol Use Never       Code status  Code status discussed with the patient/caregivers.  Prior    Review of systems    A comprehensive review of systems was negative except for that written in the History of Present Illness.       Physical  Examination   BP (!) 221/112   Pulse (!) 107   Temp 97.3 F (36.3 C) (Axillary)   Resp 10   Wt 55.2 kg (121 lb 11.1 oz)   SpO2 98%   BMI 24.57 kg/m             Gen:  Drowsy, opens eyes and falls asleep immediately  HEENT:  Pink conjunctivae, PERRL, hearing intact to voice, moist mucous membranes  Neck:  Supple, without masses, thyroid non-tender  Resp:  No accessory muscle use, clear breath sounds without wheezes rales or rhonchi  Card:  No murmurs, normal S1, S2 without thrills, bruits or peripheral edema  Abd:  Soft, non-tender, non-distended, normoactive bowel sounds are present  Lymph:  No cervical adenopathy  Musc:  No cyanosis or clubbing  Skin:  No rashes   Neuro: Drowsy  Psych: Not alert, not anxious.    Data Review    24 Hour Results:  Recent Results (from the past 24 hour(s))   CBC with Auto Differential    Collection Time: 10/21/23  6:18 AM   Result Value Ref Range    WBC 14.4 (H) 3.6 - 11.0 K/uL    RBC 3.53 (L) 3.80 - 5.20 M/uL    Hemoglobin 10.2 (L) 11.5 - 16.0 g/dL    Hematocrit 40.9 (L) 35.0 - 47.0 %    MCV 85.8 80.0 - 99.0 FL    MCH 28.9 26.0 - 34.0 PG    MCHC 33.7 30.0 - 36.5 g/dL    RDW 81.1 91.4 - 78.2 %    Platelets 619 (H) 150 - 400 K/uL    MPV 8.6 (L) 8.9 - 12.9 FL    Nucleated RBCs 0.0 0 PER 100 WBC    nRBC 0.00 0.00 - 0.01 K/uL    Neutrophils % 83.4 (H) 32.0 - 75.0 %    Lymphocytes % 10.5 (L) 12.0 - 49.0 %    Monocytes % 4.1 (L) 5.0 - 13.0 %    Eosinophils % 1.0 0.0 - 7.0 %    Basophils % 0.6 0.0 - 1.0 %    Immature Granulocytes % 0.4 0.0 - 0.5 %    Neutrophils Absolute 12.01 (H) 1.80 - 8.00 K/UL    Lymphocytes Absolute 1.51 0.80 - 3.50 K/UL    Monocytes Absolute 0.59 0.00 - 1.00 K/UL    Eosinophils Absolute 0.14 0.00 - 0.40 K/UL    Basophils Absolute 0.09 0.00 - 0.10 K/UL    Immature Granulocytes Absolute 0.06 (H) 0.00 - 0.04 K/UL    Differential Type SMEAR SCANNED      RBC Comment NORMOCYTIC, NORMOCHROMIC     Comprehensive Metabolic Panel    Collection Time: 10/21/23  6:18 AM    Result Value Ref Range    Sodium 140 136 - 145 mmol/L    Potassium 3.4 (L) 3.5 - 5.1 mmol/L    Chloride 107 97 - 108 mmol/L    CO2 27 21 - 32 mmol/L    Anion Gap 6 2 - 12 mmol/L    Glucose 222 (H) 65 - 100 mg/dL    BUN 26 (H) 6 - 20 MG/DL    Creatinine 9.56 (H) 0.55 - 1.02 MG/DL    BUN/Creatinine Ratio 14 12 - 20      Est, Glom Filt Rate 35 (L) >60 ml/min/1.65m2    Calcium 9.7 8.5 - 10.1 MG/DL    Total Bilirubin 0.3 0.2 - 1.0 MG/DL    ALT 47 12 - 78 U/L    AST 32 15 - 37 U/L    Alk Phosphatase 129 (H) 45 - 117 U/L    Total Protein 7.5 6.4 - 8.2 g/dL    Albumin 3.1 (L) 3.5 - 5.0 g/dL    Globulin 4.4 (H) 2.0 - 4.0 g/dL    Albumin/Globulin Ratio 0.7 (  L) 1.1 - 2.2     Extra Tubes Hold    Collection Time: 10/21/23  6:18 AM   Result Value Ref Range    Specimen HOld 1SST,1RED,1DRK GRN,1BLUE     Comment:        Add-on orders for these samples will be processed based on acceptable specimen integrity and analyte stability, which may vary by analyte.   Lipase    Collection Time: 10/21/23  6:18 AM   Result Value Ref Range    Lipase 18 13 - 75 U/L   Magnesium    Collection Time: 10/21/23  6:18 AM   Result Value Ref Range    Magnesium 1.9 1.6 - 2.4 mg/dL   EKG 12 Lead    Collection Time: 10/21/23  7:09 AM   Result Value Ref Range    Ventricular Rate 127 BPM    Atrial Rate 127 BPM    P-R Interval 126 ms    QRS Duration 78 ms    Q-T Interval 348 ms    QTc Calculation (Bazett) 505 ms    P Axis 61 degrees    R Axis 264 degrees    T Axis 27 degrees    Diagnosis       Sinus tachycardia  Right superior axis deviation  Pulmonary disease pattern  Minimal voltage criteria for LVH, may be normal variant ( Cornell product )  Nonspecific ST abnormality  Abnormal ECG  When compared with ECG of 18-Sep-2023 00:33,  ST now depressed in Anterior leads  Nonspecific T wave abnormality no longer evident in Anterolateral leads       Lab Results   Component Value Date/Time    WBC 14.4 10/21/2023 06:18 AM    WBC 5.8 10/14/2023 06:02 AM    WBC 6.0  10/13/2023 01:24 AM    HGB 10.2 10/21/2023 06:18 AM    HGB 7.2 10/14/2023 06:02 AM    HGB 7.1 10/13/2023 01:24 AM    HCT 30.3 10/21/2023 06:18 AM    HCT 22.0 10/14/2023 06:02 AM    HCT 22.0 10/13/2023 01:24 AM    PLT 619 10/21/2023 06:18 AM    PLT 466 10/14/2023 06:02 AM    PLT 385 10/13/2023 01:24 AM     Lab Results   Component Value Date/Time    NA 140 10/21/2023 06:18 AM    NA 134 10/14/2023 06:02 AM    NA 132 10/13/2023 01:24 AM    K 3.4 10/21/2023 06:18 AM    K 4.2 10/14/2023 06:02 AM    K 4.7 10/13/2023 01:24 AM    CL 107 10/21/2023 06:18 AM    CL 102 10/14/2023 06:02 AM    CL 101 10/13/2023 01:24 AM    CO2 27 10/21/2023 06:18 AM    CO2 28 10/14/2023 06:02 AM    CO2 29 10/13/2023 01:24 AM    BUN 26 10/21/2023 06:18 AM    BUN 33 10/14/2023 06:02 AM    BUN 26 10/13/2023 01:24 AM    MG 1.9 10/21/2023 06:18 AM    MG 2.1 10/14/2023 06:02 AM    MG 1.9 10/13/2023 01:24 AM    PHOS 3.8 10/14/2023 06:02 AM    PHOS 4.8 10/12/2023 06:01 AM    PHOS 4.1 10/11/2023 05:48 AM    ALT 47 10/21/2023 06:18 AM    ALT 19 10/08/2023 04:19 AM    ALT 19 10/07/2023 05:01 AM    INR 1.0 03/21/2023 03:00 AM    INR 1.0 03/20/2023 01:26  AM    INR 1.0 03/19/2023 05:20 AM       Imaging  CT ABDOMEN PELVIS WO CONTRAST Additional Contrast? None   Final Result   No renal or ureteral stone or evidence of hydronephrosis. No acute abnormality.      Electronically signed by Carola Rhine            Assessment and Plan     Intractable nausea vomiting and abdominal pain  -She has had an extensive workup spanning several years for the above symptoms  -Repeat CT abdomen and pelvis today did not show any acute abnormalities  -She has a history of gastroparesis with a recent delayed gastric emptying study on 1/2  -Will continue scheduled Reglan IV, IV Zofran as needed in between  -Full liquid diet, bland given recent endoscopy with gastritis  -PPI    Hypertensive urgency  -Suspect due to the above issues and also uncertain if she is compliant with her home  medications  -Given IV hydralazine in the ER  -Continue home medications  -Will consult pharmacy for med rec    AKI on CKD  Hypokalemia  -Suspect IVVD from vomiting  -Monitor with IV fluids    SIRS  -Triggered due to leukocytosis, tachycardia but suspect this is all due to IVVD and vomiting  -Monitor with fluids  -UA pending    Type 2 diabetes  -Recent A1c 7.5  -Continue home insulin  -SSI per protocol    History of SMA stenosis  -Recent SMA stenting on 12/23  -Continue aspirin and Plavix    Substance use disorder  -Continue methadone  -Consult pharmacy to confirm dosage      Addendum: It does not appear that patient is on chronic methadone or any chronic opiates      Diet: Diabetic, full liquid  Activity: As tolerated  DVT prophylaxis: Lovenox  Isolation precautions: None       Signed by: Pryor Ochoa, MD    October 21, 2023 at 10:31 AM

## 2023-10-22 LAB — CBC WITH AUTO DIFFERENTIAL
Basophils %: 0.8 % (ref 0.0–1.0)
Basophils Absolute: 0.07 10*3/uL (ref 0.00–0.10)
Eosinophils %: 2.9 % (ref 0.0–7.0)
Eosinophils Absolute: 0.24 10*3/uL (ref 0.00–0.40)
Hematocrit: 23 % — ABNORMAL LOW (ref 35.0–47.0)
Hemoglobin: 7.4 g/dL — ABNORMAL LOW (ref 11.5–16.0)
Immature Granulocytes %: 0.2 % (ref 0.0–0.5)
Immature Granulocytes Absolute: 0.02 10*3/uL (ref 0.00–0.04)
Lymphocytes %: 19.5 % (ref 12.0–49.0)
Lymphocytes Absolute: 1.62 10*3/uL (ref 0.80–3.50)
MCH: 28.5 pg (ref 26.0–34.0)
MCHC: 32.2 g/dL (ref 30.0–36.5)
MCV: 88.5 fL (ref 80.0–99.0)
MPV: 8.4 fL — ABNORMAL LOW (ref 8.9–12.9)
Monocytes %: 7.9 % (ref 5.0–13.0)
Monocytes Absolute: 0.66 10*3/uL (ref 0.00–1.00)
Neutrophils %: 68.7 % (ref 32.0–75.0)
Neutrophils Absolute: 5.7 10*3/uL (ref 1.80–8.00)
Nucleated RBCs: 0 /100{WBCs}
Platelets: 415 10*3/uL — ABNORMAL HIGH (ref 150–400)
RBC: 2.6 M/uL — ABNORMAL LOW (ref 3.80–5.20)
RDW: 14.6 % — ABNORMAL HIGH (ref 11.5–14.5)
WBC: 8.3 10*3/uL (ref 3.6–11.0)
nRBC: 0 10*3/uL (ref 0.00–0.01)

## 2023-10-22 LAB — BASIC METABOLIC PANEL
Anion Gap: 4 mmol/L (ref 2–12)
BUN/Creatinine Ratio: 14 (ref 12–20)
BUN: 26 mg/dL — ABNORMAL HIGH (ref 6–20)
CO2: 26 mmol/L (ref 21–32)
Calcium: 8.1 mg/dL — ABNORMAL LOW (ref 8.5–10.1)
Chloride: 113 mmol/L — ABNORMAL HIGH (ref 97–108)
Creatinine: 1.92 mg/dL — ABNORMAL HIGH (ref 0.55–1.02)
Est, Glom Filt Rate: 33 mL/min/{1.73_m2} — ABNORMAL LOW (ref >60)
Glucose: 130 mg/dL — ABNORMAL HIGH (ref 65–100)
Potassium: 3.8 mmol/L (ref 3.5–5.1)
Sodium: 143 mmol/L (ref 136–145)

## 2023-10-22 LAB — POCT GLUCOSE
POC Glucose: 197 mg/dL — ABNORMAL HIGH (ref 65–117)
POC Glucose: 191 mg/dL — ABNORMAL HIGH (ref 65–117)
POC Glucose: 200 mg/dL — ABNORMAL HIGH (ref 65–117)

## 2023-10-22 MED ORDER — HYDRALAZINE HCL 20 MG/ML IJ SOLN
20 | Freq: Four times a day (QID) | INTRAMUSCULAR | Status: DC | PRN
Start: 2023-10-22 — End: 2023-10-23
  Administered 2023-10-22 – 2023-10-23 (×5): 10 mg via INTRAVENOUS

## 2023-10-22 MED ORDER — ONDANSETRON 4 MG PO TBDP
4 MG | Freq: Three times a day (TID) | ORAL | Status: DC | PRN
Start: 2023-10-22 — End: 2023-10-25

## 2023-10-22 MED ORDER — ONDANSETRON HCL 4 MG/2ML IJ SOLN
4 | Freq: Four times a day (QID) | INTRAMUSCULAR | Status: DC | PRN
Start: 2023-10-22 — End: 2023-10-22
  Administered 2023-10-22 (×2): 4 mg via INTRAVENOUS

## 2023-10-22 MED ORDER — SODIUM CHLORIDE 0.9 % IV SOLN
0.9 | INTRAVENOUS | Status: AC
Start: 2023-10-22 — End: 2023-10-23

## 2023-10-22 MED ORDER — PROCHLORPERAZINE EDISYLATE 10 MG/2ML IJ SOLN
10 | Freq: Four times a day (QID) | INTRAMUSCULAR | Status: DC | PRN
Start: 2023-10-22 — End: 2023-10-22

## 2023-10-22 MED ORDER — MORPHINE SULFATE (PF) 4 MG/ML IJ SOLN
4 | Freq: Once | INTRAMUSCULAR | Status: AC
Start: 2023-10-22 — End: 2023-10-22
  Administered 2023-10-22: 08:00:00 4 mg via INTRAVENOUS

## 2023-10-22 MED ORDER — METOCLOPRAMIDE HCL 10 MG PO TABS
10 MG | Freq: Four times a day (QID) | ORAL | Status: DC
Start: 2023-10-22 — End: 2023-10-25
  Administered 2023-10-22 – 2023-10-23 (×3): 5 mg via ORAL

## 2023-10-22 MED ORDER — ONDANSETRON HCL 4 MG/2ML IJ SOLN
4 | Freq: Four times a day (QID) | INTRAMUSCULAR | Status: DC | PRN
Start: 2023-10-22 — End: 2023-10-22

## 2023-10-22 MED ORDER — DEXAMETHASONE SOD PHOSPHATE PF 10 MG/ML IJ SOLN
10 | INTRAMUSCULAR | Status: DC
Start: 2023-10-22 — End: 2023-10-23
  Administered 2023-10-22: 22:00:00 10 mg via INTRAVENOUS

## 2023-10-22 MED FILL — PANTOPRAZOLE SODIUM 40 MG IV SOLR: 40 MG | INTRAVENOUS | Qty: 40

## 2023-10-22 MED FILL — DEXAMETHASONE SOD PHOSPHATE PF 10 MG/ML IJ SOLN: 10 MG/ML | INTRAMUSCULAR | Qty: 1

## 2023-10-22 MED FILL — AMLODIPINE BESYLATE 5 MG PO TABS: 5 MG | ORAL | Qty: 2

## 2023-10-22 MED FILL — BUSPIRONE HCL 5 MG PO TABS: 5 MG | ORAL | Qty: 1

## 2023-10-22 MED FILL — DULOXETINE HCL 30 MG PO CPEP: 30 MG | ORAL | Qty: 2

## 2023-10-22 MED FILL — ASPIRIN LOW DOSE 81 MG PO CHEW: 81 MG | ORAL | Qty: 1

## 2023-10-22 MED FILL — CLOPIDOGREL BISULFATE 75 MG PO TABS: 75 MG | ORAL | Qty: 1

## 2023-10-22 MED FILL — ONDANSETRON HCL 4 MG/2ML IJ SOLN: 4 MG/2ML | INTRAMUSCULAR | Qty: 2

## 2023-10-22 MED FILL — METOCLOPRAMIDE HCL 10 MG PO TABS: 10 MG | ORAL | Qty: 1

## 2023-10-22 MED FILL — METOCLOPRAMIDE HCL 5 MG/ML IJ SOLN: 5 MG/ML | INTRAMUSCULAR | Qty: 2

## 2023-10-22 MED FILL — LINZESS 145 MCG PO CAPS: 145 MCG | ORAL | Qty: 2

## 2023-10-22 MED FILL — ACETAMINOPHEN 325 MG PO TABS: 325 MG | ORAL | Qty: 2

## 2023-10-22 MED FILL — HYDRALAZINE HCL 20 MG/ML IJ SOLN: 20 MG/ML | INTRAMUSCULAR | Qty: 1

## 2023-10-22 MED FILL — POTASSIUM CHLORIDE 10 MEQ/100ML IV SOLN: 10 MEQ/0ML | INTRAVENOUS | Qty: 100

## 2023-10-22 MED FILL — MORPHINE SULFATE 4 MG/ML IJ SOLN: 4 mg/mL | INTRAMUSCULAR | Qty: 1

## 2023-10-22 MED FILL — SODIUM CHLORIDE 0.9 % IV SOLN: 0.9 % | INTRAVENOUS | Qty: 1000

## 2023-10-22 MED FILL — METOPROLOL SUCCINATE ER 50 MG PO TB24: 50 MG | ORAL | Qty: 1

## 2023-10-22 MED FILL — LISINOPRIL 20 MG PO TABS: 20 MG | ORAL | Qty: 2

## 2023-10-22 NOTE — Plan of Care (Signed)
Problem: Chronic Conditions and Co-morbidities  Goal: Patient's chronic conditions and co-morbidity symptoms are monitored and maintained or improved  Outcome: Progressing     Problem: Discharge Planning  Goal: Discharge to home or other facility with appropriate resources  Outcome: Not Progressing  Flowsheets (Taken 10/22/2023 0819)  Discharge to home or other facility with appropriate resources: Identify barriers to discharge with patient and caregiver     Problem: Pain  Goal: Verbalizes/displays adequate comfort level or baseline comfort level  Outcome: Not Progressing     Problem: Safety - Adult  Goal: Free from fall injury  Outcome: Adequate for Discharge     Problem: Discharge Planning  Goal: Discharge to home or other facility with appropriate resources  Outcome: Not Progressing  Flowsheets (Taken 10/22/2023 0819)  Discharge to home or other facility with appropriate resources: Identify barriers to discharge with patient and caregiver     Problem: Pain  Goal: Verbalizes/displays adequate comfort level or baseline comfort level  Outcome: Not Progressing

## 2023-10-22 NOTE — Progress Notes (Signed)
[facility] Dosing Services    Metoclopramide was automatically dose-adjusted per [facility] P&T Committee Protocol, with respect to renal function.      Assessment/Plan: Estimated Creatinine Clearance: 30 mL/min (A) (based on SCr of 1.92 mg/dL (H)). Metoclopramide changed to 5 mg four times daily before meals and nightly per P&T approved protocol for patient with CrCl 10-60  mL/min.    Creatinine Clearance Estimated Creatinine Clearance: 30 mL/min (A) (based on SCr of 1.92 mg/dL (H)).   BUN Lab Results   Component Value Date/Time    BUN 26 10/22/2023 03:10 AM         Pharmacist Volanda Napoleon, RPH

## 2023-10-22 NOTE — Progress Notes (Signed)
RRT was paged overhead, around 2156. Elevated VS, and the pt was shaking, c/o 10/10 pain in the upper stomach. Elevated BG 274. Hospitalist was made aware and placed new orders. Continuing to monitor

## 2023-10-22 NOTE — Progress Notes (Signed)
Rapid Called at 2159    Responded to RRT at 2159 for Tachycardia    Provider at bedside: NO  Interventions ordered: Other (Comment) RN   Sepsis Suspected: No  Transfer to Higher Level of Care: No        Vitals:    10/22/23 2154   BP: (!) 160/83   Pulse: (!) 120   Resp: (!) 34   Temp:    SpO2: 99%        Rapid Ended at 2220      Denny Levy, RN

## 2023-10-22 NOTE — Consults (Signed)
Session ID: [id number]  Language: Spanish  Interpreter ID: #[id number]  Interpreter Name: Brooke Pace

## 2023-10-22 NOTE — Progress Notes (Signed)
Spiritual Health History and Assessment/Progress Note  [facility]    Initial Encounter,  ,  ,      Name: Melinda [name] MRN: [id number]    Age: 43 y.o.     Sex: female   Language: Spanish   Religion: Catholic   Intractable vomiting     Date: 10/22/2023            Total Time Calculated: 15 min              Spiritual Assessment began in [facility] MULTI-SPECIALTY TELEMETRY            Encounter Overview/Reason: Initial Encounter  Service Provided For: Patient    Faith, Belief, Meaning:   Patient identifies as spiritual, is connected with a faith tradition or spiritual practice, and has beliefs or practices that help with coping during difficult times  Family/Friends No family/friends present      Importance and Influence:  Patient has spiritual/personal beliefs that influence decisions regarding their health  Family/Friends No family/friends present    Community:  Patient is connected with a spiritual community and feels well-supported. Support system includes: Spouse/Partner, Children, Runner, broadcasting/film/video, and Extended family  Family/Friends No family/friends present    Assessment and Plan of Care:   Reviewed chart prior to meeting Pt bedside. Pt is Spanish speaking but understands [place]. Melinda [name] is sitting on bed with green bag, she is getting sick. No visitors. She remembers Chaplain from previous hospitalizations. She is in no condition for discussion right now, so Chaplain shared supportive words and availability and prayers for her. She said thank you.     Patient Interventions include: Facilitated expression of thoughts and feelings and Affirmed coping skills/support systems  Family/Friends Interventions include: No family/friends present    Patient Plan of Care: Spiritual Care available upon further referral  Family/Friends Plan of Care: No family/friends present    Electronically signed by Hale Bogus, [facility] on 10/22/2023 at 4:47 PM

## 2023-10-22 NOTE — Progress Notes (Signed)
[facility]  [address] [place], [place] [address]  [phone]        Hospitalist Progress Note      NAME: Melinda [name]   DOB:  07/16/1981  MRM:  [id number]    Date/Time of service: 10/22/2023  5:04 PM       Subjective:     Chief Complaint:  Patient was personally seen and examined by me during this time period.  Chart reviewed.  F/up recurrent intractable nausea and vomiting    Continues to have nausea and NBNB vomiting both postprandial and spontaneous. Some epigastric pain.Otherwise no complaitns       Objective:       Vitals:       Last 24hrs VS reviewed since prior progress note. Most recent are:    Vitals:    10/22/23 1528   BP: (!) 167/91   Pulse: (!) 106   Resp:    Temp: 97.7 F (36.5 C)   SpO2: 98%     SpO2 Readings from Last 6 Encounters:   10/22/23 98%   10/14/23 98%   09/22/23 98%   09/14/23 100%   08/31/23 100%   07/19/23 100%          Intake/Output Summary (Last 24 hours) at 10/22/2023 1704  Last data filed at 10/22/2023 1504  Gross per 24 hour   Intake 2713.7 ml   Output 1000 ml   Net 1713.7 ml        Exam:     Physical Exam:    Gen:  in no acute distress  HEENT:  Pink conjunctivae, eomi, hearing intact to voice, moist mucous membranes  Resp:  No accessory muscle use, clear breath sounds without wheezes rales or rhonchi  Card:  No murmurs, normal S1, S2 without thrills, bruits or peripheral edema  Abd:  Epigastric TTP. Otherwise no tenderness  Musc:  No cyanosis or clubbing  Skin:  No rashes or ulcers, skin turgor is good  Neuro:  follows commands appropriately  Psych:  Good insight, oriented to person, place and time, alert      Medications Reviewed: (see below)    Lab Data Reviewed: (see below)    ______________________________________________________________________    Medications:     Current Facility-Administered Medications   Medication Dose Route Frequency    metoclopramide (REGLAN) tablet 5 mg  5 mg Oral 4x Daily AC & HS    ondansetron (ZOFRAN-ODT)  disintegrating tablet 4 mg  4 mg Oral Q8H PRN    dexAMETHasone (PF) (DECADRON) injection 10 mg  10 mg IntraVENous Q24H    sodium chloride flush 0.9 % injection 5-40 mL  5-40 mL IntraVENous 2 times per day    sodium chloride flush 0.9 % injection 5-40 mL  5-40 mL IntraVENous PRN    0.9 % sodium chloride infusion   IntraVENous PRN    potassium chloride (KLOR-CON) extended release tablet 40 mEq  40 mEq Oral PRN    Or    potassium bicarb-citric acid (EFFER-K) effervescent tablet 40 mEq  40 mEq Oral PRN    Or    potassium chloride 10 mEq/100 mL IVPB (Peripheral Line)  10 mEq IntraVENous PRN    magnesium sulfate 2000 mg in 50 mL IVPB premix  2,000 mg IntraVENous PRN    enoxaparin (LOVENOX) injection 40 mg  40 mg SubCUTAneous Daily    polyethylene glycol (GLYCOLAX) packet 17 g  17 g Oral Daily PRN    acetaminophen (TYLENOL)  tablet 650 mg  650 mg Oral Q6H PRN    Or    acetaminophen (TYLENOL) suppository 650 mg  650 mg Rectal Q6H PRN    pantoprazole (PROTONIX) 40 mg in sodium chloride (PF) 0.9 % 10 mL injection  40 mg IntraVENous Daily    amLODIPine (NORVASC) tablet 10 mg  10 mg Oral Daily    aspirin chewable tablet 81 mg  81 mg Oral Daily    busPIRone (BUSPAR) tablet 15 mg  15 mg Oral TID    clopidogrel (PLAVIX) tablet 75 mg  75 mg Oral Daily    dicyclomine (BENTYL) tablet 20 mg  20 mg Oral Q6H PRN    DULoxetine (CYMBALTA) extended release capsule 60 mg  60 mg Oral Daily    linaclotide (LINZESS) capsule 290 mcg  290 mcg Oral QAM AC    lisinopril (PRINIVIL;ZESTRIL) tablet 40 mg  40 mg Oral Daily    metoprolol succinate (TOPROL XL) extended release tablet 50 mg  50 mg Oral Daily    hydrALAZINE (APRESOLINE) injection 10 mg  10 mg IntraVENous Q6H PRN          Lab Review:     Recent Labs     10/21/23  0618 10/22/23  0310   WBC 14.4* 8.3   HGB 10.2* 7.4*   HCT 30.3* 23.0*   PLT 619* 415*     Recent Labs     10/21/23  0618 10/22/23  0310   NA 140 143   K 3.4* 3.8   CL 107 113*   CO2 27 26   BUN 26* 26*   MG 1.9  --    ALT 47  --       No results found for: "GLUCPOC"       Assessment / Plan:     Intractable nausea vomiting and abdominal pain: POA. Recurrent. She has had an extensive workup spanning several years for the above symptoms. CT abd NAP. She has a history of gastroparesis with a recent delayed gastric emptying study on 1/2  - reglan and zofran  - PPI  - CLD ADAT  - Cont IVF  - Watch lytes    Hypertensive urgency: POA. Suspect due to the above issues and also uncertain if she is compliant with her home medications  - Continue home medications     AKI on CKD  Hypokalemia  -Suspect IVVD from vomiting  -Monitor with IV fluids     SIRS  -Triggered due to leukocytosis, tachycardia but suspect this is all due to IVVD and vomiting  -Monitor with fluids  -UA pending     Type 2 diabetes  -Recent A1c 7.5  -Continue home insulin  -SSI per protocol     History of SMA stenosis  -Recent SMA stenting on 12/23  -Continue aspirin and Plavix     Substance use disorder  - not on methadone or other opioids  - UDS    POLST: Patient not ready    **Prior records, notes, labs, radiology, and medications reviewed in [facility]**               Care Plan discussed with: Patient, Care Manager, and Nursing Staff    Discussed:  Care Plan and D/C Planning    Prophylaxis:  Lovenox    Disposition:  Home w/Family           ___________________________________________________    Attending Physician: Renelda Loma, MD

## 2023-10-23 LAB — PHOSPHORUS: Phosphorus: 5 mg/dL — ABNORMAL HIGH (ref 2.6–4.7)

## 2023-10-23 LAB — BASIC METABOLIC PANEL
Anion Gap: 6 mmol/L (ref 2–12)
BUN/Creatinine Ratio: 11 — ABNORMAL LOW (ref 12–20)
BUN: 23 mg/dL — ABNORMAL HIGH (ref 6–20)
CO2: 26 mmol/L (ref 21–32)
Calcium: 8.9 mg/dL (ref 8.5–10.1)
Chloride: 106 mmol/L (ref 97–108)
Creatinine: 2.09 mg/dL — ABNORMAL HIGH (ref 0.55–1.02)
Est, Glom Filt Rate: 30 mL/min/{1.73_m2} — ABNORMAL LOW (ref >60)
Glucose: 281 mg/dL — ABNORMAL HIGH (ref 65–100)
Potassium: 3.9 mmol/L (ref 3.5–5.1)
Sodium: 138 mmol/L (ref 136–145)

## 2023-10-23 LAB — POCT GLUCOSE
POC Glucose: 274 mg/dL — ABNORMAL HIGH (ref 65–117)
POC Glucose: 238 mg/dL — ABNORMAL HIGH (ref 65–117)
POC Glucose: 217 mg/dL — ABNORMAL HIGH (ref 65–117)

## 2023-10-23 LAB — CBC
Hematocrit: 26.9 % — ABNORMAL LOW (ref 35.0–47.0)
Hemoglobin: 8.9 g/dL — ABNORMAL LOW (ref 11.5–16.0)
MCH: 29 pg (ref 26.0–34.0)
MCHC: 33.1 g/dL (ref 30.0–36.5)
MCV: 87.6 fL (ref 80.0–99.0)
MPV: 8.5 fL — ABNORMAL LOW (ref 8.9–12.9)
Nucleated RBCs: 0 /100{WBCs}
Platelets: 488 10*3/uL — ABNORMAL HIGH (ref 150–400)
RBC: 3.07 M/uL — ABNORMAL LOW (ref 3.80–5.20)
RDW: 14 % (ref 11.5–14.5)
WBC: 8.7 10*3/uL (ref 3.6–11.0)
nRBC: 0 10*3/uL (ref 0.00–0.01)

## 2023-10-23 LAB — MAGNESIUM: Magnesium: 2.3 mg/dL (ref 1.6–2.4)

## 2023-10-23 MED ORDER — ERYTHROMYCIN LACTOBIONATE 500 MG IV SOLR
500 | Freq: Four times a day (QID) | INTRAVENOUS | Status: DC
Start: 2023-10-23 — End: 2023-10-25
  Administered 2023-10-23 – 2023-10-25 (×8): 125 mg via INTRAVENOUS

## 2023-10-23 MED ORDER — DEXTROSE 10 % IV BOLUS
INTRAVENOUS | Status: AC | PRN
Start: 2023-10-23 — End: 2023-10-25

## 2023-10-23 MED ORDER — ONDANSETRON HCL 4 MG/2ML IJ SOLN
4 | Freq: Four times a day (QID) | INTRAMUSCULAR | Status: DC | PRN
Start: 2023-10-23 — End: 2023-10-23

## 2023-10-23 MED ORDER — DICYCLOMINE HCL 20 MG PO TABS
20 | Freq: Four times a day (QID) | ORAL | Status: DC | PRN
Start: 2023-10-23 — End: 2023-10-22

## 2023-10-23 MED ORDER — PROCHLORPERAZINE EDISYLATE 10 MG/2ML IJ SOLN
10 MG/2ML | Freq: Four times a day (QID) | INTRAMUSCULAR | Status: DC | PRN
Start: 2023-10-23 — End: 2023-10-25
  Administered 2023-10-23 (×2): 10 mg via INTRAVENOUS

## 2023-10-23 MED ORDER — LABETALOL HCL 5 MG/ML IV SOLN
5 | INTRAVENOUS | Status: DC
Start: 2023-10-23 — End: 2023-10-23
  Administered 2023-10-23: 18:00:00 0.5 mg/min via INTRAVENOUS

## 2023-10-23 MED ORDER — DEXTROSE 10 % IV SOLN
10 % | INTRAVENOUS | Status: AC | PRN
Start: 2023-10-23 — End: 2023-10-25

## 2023-10-23 MED ORDER — LACTATED RINGERS IV BOLUS
Freq: Once | INTRAVENOUS | Status: AC
Start: 2023-10-23 — End: 2023-10-23
  Administered 2023-10-23: 14:00:00 1000 mL via INTRAVENOUS

## 2023-10-23 MED ORDER — ENOXAPARIN SODIUM 30 MG/0.3ML IJ SOSY
30 MG/0.3ML | Freq: Every day | INTRAMUSCULAR | Status: DC
Start: 2023-10-23 — End: 2023-10-25
  Administered 2023-10-24 – 2023-10-25 (×2): 30 mg via SUBCUTANEOUS

## 2023-10-23 MED ORDER — LABETALOL HCL 5 MG/ML IV SOLN
5 MG/ML | Freq: Four times a day (QID) | INTRAVENOUS | Status: DC | PRN
Start: 2023-10-23 — End: 2023-10-25
  Administered 2023-10-23 – 2023-10-25 (×4): 10 mg via INTRAVENOUS

## 2023-10-23 MED ORDER — METOCLOPRAMIDE HCL 5 MG/ML IJ SOLN
5 MG/ML | Freq: Four times a day (QID) | INTRAMUSCULAR | Status: DC
Start: 2023-10-23 — End: 2023-10-25
  Administered 2023-10-23 – 2023-10-25 (×8): 5 mg via INTRAVENOUS

## 2023-10-23 MED ORDER — HYDRALAZINE HCL 20 MG/ML IJ SOLN
20 MG/ML | Freq: Four times a day (QID) | INTRAMUSCULAR | Status: DC | PRN
Start: 2023-10-23 — End: 2023-10-25
  Administered 2023-10-24 – 2023-10-25 (×4): 20 mg via INTRAVENOUS

## 2023-10-23 MED ORDER — INSULIN LISPRO 100 UNIT/ML IJ SOLN
100 UNIT/ML | Freq: Four times a day (QID) | INTRAMUSCULAR | Status: AC
Start: 2023-10-23 — End: 2023-10-25
  Administered 2023-10-23 – 2023-10-24 (×5): 2 [IU] via SUBCUTANEOUS
  Administered 2023-10-25: 18:00:00 4 [IU] via SUBCUTANEOUS
  Administered 2023-10-25: 05:00:00 2 [IU] via SUBCUTANEOUS

## 2023-10-23 MED ORDER — GLUCOSE 4 G PO CHEW
4 g | ORAL | Status: AC | PRN
Start: 2023-10-23 — End: 2023-10-25

## 2023-10-23 MED ORDER — CLONIDINE 0.1 MG/24HR TD PTWK
0.1 MG/24HR | TRANSDERMAL | Status: DC
Start: 2023-10-23 — End: 2023-10-25
  Administered 2023-10-23: 20:00:00 1 via TRANSDERMAL

## 2023-10-23 MED ORDER — GLUCAGON (RDNA) 1 MG IJ KIT
1 MG | INTRAMUSCULAR | Status: AC | PRN
Start: 2023-10-23 — End: 2023-10-25

## 2023-10-23 MED ORDER — MORPHINE SULFATE (PF) 2 MG/ML IV SOLN
2 | Freq: Once | INTRAVENOUS | Status: AC
Start: 2023-10-23 — End: 2023-10-23
  Administered 2023-10-23: 06:00:00 2 mg via INTRAVENOUS

## 2023-10-23 MED ORDER — LORAZEPAM 2 MG/ML IJ SOLN
2 MG/ML | Freq: Four times a day (QID) | INTRAMUSCULAR | Status: DC
Start: 2023-10-23 — End: 2023-10-25
  Administered 2023-10-23 – 2023-10-25 (×9): 0.5 mg via INTRAVENOUS

## 2023-10-23 MED FILL — BUSPIRONE HCL 5 MG PO TABS: 5 MG | ORAL | Qty: 1

## 2023-10-23 MED FILL — DICYCLOMINE HCL 20 MG PO TABS: 20 MG | ORAL | Qty: 1

## 2023-10-23 MED FILL — AMLODIPINE BESYLATE 5 MG PO TABS: 5 MG | ORAL | Qty: 2

## 2023-10-23 MED FILL — LACTATED RINGERS IV SOLN: INTRAVENOUS | Qty: 1000

## 2023-10-23 MED FILL — ERYTHROCIN LACTOBIONATE 500 MG IV SOLR: 500 MG | INTRAVENOUS | Qty: 125

## 2023-10-23 MED FILL — CLONIDINE 0.1 MG/24HR TD PTWK: 0.1 MG/24HR | TRANSDERMAL | Qty: 1

## 2023-10-23 MED FILL — HYDRALAZINE HCL 20 MG/ML IJ SOLN: 20 MG/ML | INTRAMUSCULAR | Qty: 1

## 2023-10-23 MED FILL — PROCHLORPERAZINE EDISYLATE 10 MG/2ML IJ SOLN: 10 MG/2ML | INTRAMUSCULAR | Qty: 2

## 2023-10-23 MED FILL — INSULIN LISPRO 100 UNIT/ML IJ SOLN: 100 UNIT/ML | INTRAMUSCULAR | Qty: 2

## 2023-10-23 MED FILL — LORAZEPAM 2 MG/ML IJ SOLN: 2 MG/ML | INTRAMUSCULAR | Qty: 1

## 2023-10-23 MED FILL — METOCLOPRAMIDE HCL 5 MG/ML IJ SOLN: 5 MG/ML | INTRAMUSCULAR | Qty: 2

## 2023-10-23 MED FILL — LISINOPRIL 20 MG PO TABS: 20 MG | ORAL | Qty: 2

## 2023-10-23 MED FILL — LINZESS 145 MCG PO CAPS: 145 MCG | ORAL | Qty: 2

## 2023-10-23 MED FILL — METOPROLOL SUCCINATE ER 50 MG PO TB24: 50 MG | ORAL | Qty: 1

## 2023-10-23 MED FILL — ACETAMINOPHEN 325 MG PO TABS: 325 MG | ORAL | Qty: 2

## 2023-10-23 MED FILL — PANTOPRAZOLE SODIUM 40 MG IV SOLR: 40 MG | INTRAVENOUS | Qty: 40

## 2023-10-23 MED FILL — LABETALOL HCL 5 MG/ML IV SOLN: 5 MG/ML | INTRAVENOUS | Qty: 4

## 2023-10-23 MED FILL — METOCLOPRAMIDE HCL 10 MG PO TABS: 10 MG | ORAL | Qty: 1

## 2023-10-23 MED FILL — CLOPIDOGREL BISULFATE 75 MG PO TABS: 75 MG | ORAL | Qty: 1

## 2023-10-23 MED FILL — DEXTROSE 10 % IV SOLN: 10 % | INTRAVENOUS | Qty: 500

## 2023-10-23 MED FILL — MORPHINE SULFATE 2 MG/ML IJ SOLN: 2 mg/mL | INTRAMUSCULAR | Qty: 1

## 2023-10-23 MED FILL — ASPIRIN LOW DOSE 81 MG PO CHEW: 81 MG | ORAL | Qty: 1

## 2023-10-23 MED FILL — LABETALOL HCL 5 MG/ML IV SOLN: 5 MG/ML | INTRAVENOUS | Qty: 60

## 2023-10-23 MED FILL — DULOXETINE HCL 30 MG PO CPEP: 30 MG | ORAL | Qty: 2

## 2023-10-23 MED FILL — ENOXAPARIN SODIUM 40 MG/0.4ML IJ SOSY: 40 MG/0.4ML | INTRAMUSCULAR | Qty: 0.4

## 2023-10-23 NOTE — Progress Notes (Signed)
D/w Dr Silvestre Gunner in ICU. He recommended foregoing aggressive BP/HR control.  - DC labetalol gtt  - apply low dose clonidine patch  - Added erythomycin for gastroparesis. Continue reglan as well  - Scheduled ativan for the nausea and vomiting. Refractory to steroids. Can't do antipsychotic d/t Qtc prolongation  - See full note for rest of plan    Renelda Loma, MD

## 2023-10-23 NOTE — Plan of Care (Signed)
Problem: Chronic Conditions and Co-morbidities  Goal: Patient's chronic conditions and co-morbidity symptoms are monitored and maintained or improved  10/23/2023 2329 by Lurlean Nanny, RN  Outcome: Progressing     Problem: Discharge Planning  Goal: Discharge to home or other facility with appropriate resources  10/23/2023 2329 by Lurlean Nanny, RN  Outcome: Progressing     Problem: Pain  Goal: Verbalizes/displays adequate comfort level or baseline comfort level  10/23/2023 2329 by Lurlean Nanny, RN  Outcome: Progressing  Flowsheets (Taken 10/23/2023 2000)  Verbalizes/displays adequate comfort level or baseline comfort level:   Encourage patient to monitor pain and request assistance   Assess pain using appropriate pain scale   Administer analgesics based on type and severity of pain and evaluate response   Implement non-pharmacological measures as appropriate and evaluate response   Consider cultural and social influences on pain and pain management   Notify Licensed Independent Practitioner if interventions unsuccessful or patient reports new pain     Problem: Safety - Adult  Goal: Free from fall injury  10/23/2023 2329 by Lurlean Nanny, RN  Outcome: Progressing

## 2023-10-23 NOTE — Consults (Signed)
Lillie Fragmin, MD  [phone] office    I am not permitted to use "perfect serve" use above for contact, thanks.   Gastroenterology Consultation Note      Admit Date: 10/21/2023  Consult Date: 10/23/2023   I greatly appreciate your asking me to see Melinda [name], thank you very much for the opportunity to participate in her care.    Narrative Assessment and Plan   Intractable nausea/vomiting  Hypertensive emergency  Abdominal pain  Diabetic gastroparesis  Hyperglycemia    -Persistent abdominal pain with nausea/vomiting in the setting of uncontrolled diabetes and recent diagnosis of gastroparesis based on gastric emptying study (Study was done inpatient, patient was on methadone and it was not a 4 hour study, so interpretation with caution).  -CT abd/pelvis 1/10 without any acute abnormality  -She is already on Reglan, compazine without much relief. Abdominal pain seems to be the main concern for her  -Continue current medications. Monitor electrolytes closely  -Recommend strict glycemic control and hyperglycemia can worsen gastroparesis symptoms  -Low fat, low fiber, frequent small meals  -Continue bowel regimen to avoid constipation  -Will consider EGD if symptoms persist    Lillie Fragmin MD  GI    Subjective:     Chief Complaint: Abdominal Pain    History of Present Illness:   43 year old female with PMH of diabetes mellitus, gastroparesis admitted with persistent nausea/vomiting with abdominal pain. She was recently discharged after being admitted with similar symptoms.  Found to be in hypertensive emergency this admission.Tachycardic.    Labs are stable. 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)     Gastroparesis     Gastroparesis     GERD (gastroesophageal reflux disease)     High cholesterol     Hypertension         Past Surgical History:    Procedure Laterality Date    CAPSULE ENDOSCOPY N/A 01/20/2023    ESOPHAGEAL CAPSULE ENDOSCOPY remove at 1624PM performed by Glyn Ade, MD at [facility] ENDOSCOPY    CHOLECYSTECTOMY, LAPAROSCOPIC N/A 02/03/2023    ROBOTIC LAPAROSCOPIC CHOLECYSTECTOMY with Indocyanine green performed by Verdis Frederickson, MD at [facility] MAIN OR    COLONOSCOPY N/A 01/19/2023    COLONOSCOPY DIAGNOSTIC performed by Glyn Ade, MD at [facility] ENDOSCOPY    INVASIVE VASCULAR N/A 10/03/2023    Angiography visceral SMA performed by Natasha Mead, MD at [facility] CARDIAC CATH LAB    INVASIVE VASCULAR N/A 10/03/2023    Ultrasound guided vascular access performed by Natasha Mead, MD at [facility] CARDIAC CATH LAB    INVASIVE VASCULAR N/A 10/03/2023    Insert stent peripheral artery performed by Natasha Mead, MD at [facility] CARDIAC CATH LAB    IR NONTUNNELED VASCULAR CATHETER > 5 YEARS  10/10/2023    IR NONTUNNELED VASCULAR CATHETER > 5 YEARS 10/10/2023 [facility] CARDIAC CATH/EP/IR LAB    IR NONTUNNELED VASCULAR CATHETER > 5 YEARS  10/10/2023    IR NONTUNNELED VASCULAR CATHETER > 5 YEARS 10/10/2023 [facility] CARDIAC CATH/EP/IR LAB    OTHER SURGICAL HISTORY Left     Rentia attachment    TUBAL LIGATION Bilateral     UPPER GASTROINTESTINAL ENDOSCOPY N/A 01/17/2023    ESOPHAGOGASTRODUODENOSCOPY performed by Glyn Ade, MD at [facility] ENDOSCOPY    UPPER GASTROINTESTINAL ENDOSCOPY N/A 01/17/2023  ESOPHAGOGASTRODUODENOSCOPY BIOPSY performed by Glyn Ade, MD at [facility] ENDOSCOPY    UPPER GASTROINTESTINAL ENDOSCOPY N/A 01/18/2023    ESOPHAGOGASTRODUODENOSCOPY performed by Glyn Ade, MD at [facility] ENDOSCOPY    UPPER GASTROINTESTINAL ENDOSCOPY N/A 05/13/2023    ESOPHAGOGASTRODUODENOSCOPY performed by Leta Speller, MD at [facility] ENDOSCOPY    UPPER GASTROINTESTINAL ENDOSCOPY N/A 05/13/2023    ESOPHAGOGASTRODUODENOSCOPY BIOPSY performed by Leta Speller, MD at [facility] ENDOSCOPY    UPPER GASTROINTESTINAL ENDOSCOPY N/A 09/21/2023    ESOPHAGOGASTRODUODENOSCOPY performed by Glyn Ade, MD at [facility]  ENDOSCOPY    UPPER GASTROINTESTINAL ENDOSCOPY N/A 09/21/2023    ESOPHAGOGASTRODUODENOSCOPY BIOPSY performed by Glyn Ade, MD at [facility] ENDOSCOPY    [place] ABSCESS DRAINAGE PERITONEAL/RETROPERITONEAL PERC  02/11/2023    [place] ABSCESS DRAINAGE PERITONEAL 02/11/2023 [facility] [place]       Social History     Tobacco Use    Smoking status: Never    Smokeless tobacco: Never   Substance Use Topics    Alcohol use: Never        No family history on file.     No Known Allergies         Home Medications:  Prior to Admission Medications   Prescriptions Last Dose Informant Patient Reported? Taking?   DULoxetine (CYMBALTA) 60 MG extended release capsule   No No   Sig: Take 1 capsule by mouth daily   amLODIPine (NORVASC) 10 MG tablet   No No   Sig: Take 1 tablet by mouth daily   amitriptyline (ELAVIL) 10 MG tablet   No No   Sig: Take 1 tablet by mouth nightly   aspirin 81 MG chewable tablet   No No   Sig: Take 1 tablet by mouth daily   busPIRone (BUSPAR) 15 MG tablet   No No   Sig: Take 15 mg by mouth 3 times daily   clopidogrel (PLAVIX) 75 MG tablet   No No   Sig: Take 1 tablet by mouth daily   dicyclomine (BENTYL) 20 MG tablet   No No   Sig: Take 1 tablet by mouth every 6 hours as needed (abdominal cramping)   linaclotide (LINZESS) 290 MCG CAPS capsule   No No   Sig: Take 1 capsule by mouth every morning (before breakfast)   lisinopril (PRINIVIL;ZESTRIL) 40 MG tablet   No No   Sig: Take 1 tablet by mouth daily   metoclopramide (REGLAN) 10 MG tablet   No No   Sig: Take 1 tablet by mouth 4 times daily as needed (nausea, vomiting)   metoprolol succinate (TOPROL XL) 50 MG extended release tablet   No No   Sig: Take 1 tablet by mouth daily   ondansetron (ZOFRAN-ODT) 4 MG disintegrating tablet   No No   Sig: Take 1 tablet by mouth every 8 hours as needed for Nausea or Vomiting   pantoprazole (PROTONIX) 40 MG tablet   No No   Sig: Take 1 tablet by mouth 2 times daily (before meals)   polyethylene glycol (GLYCOLAX) 17 GM/SCOOP powder   No No   Sig: Take  17 g by mouth 2 times daily      Facility-Administered Medications: None       Hospital Medications:  Current Facility-Administered Medications   Medication Dose Route Frequency    glucose chewable tablet 16 g  4 tablet Oral PRN    dextrose bolus 10% 125 mL  125 mL IntraVENous PRN    Or    dextrose bolus 10% 250  mL  250 mL IntraVENous PRN    glucagon injection 1 mg  1 mg SubCUTAneous PRN    dextrose 10 % infusion   IntraVENous Continuous PRN    insulin lispro (HUMALOG,ADMELOG) injection vial 0-8 Units  0-8 Units SubCUTAneous 4 times per day    metoclopramide (REGLAN) injection 5 mg  5 mg IntraVENous Q6H    [START ON 10/24/2023] enoxaparin Sodium (LOVENOX) injection 30 mg  30 mg SubCUTAneous Daily    erythromycin (ERYTHROCIN) 125 mg in sodium chloride 0.9 % 100 mL IVPB  125 mg IntraVENous Q6H    cloNIDine (CATAPRES) 0.1 MG/24HR 1 patch  1 patch TransDERmal Weekly    LORazepam (ATIVAN) injection 0.5 mg  0.5 mg IntraVENous Q6H    hydrALAZINE (APRESOLINE) injection 20 mg  20 mg IntraVENous Q6H PRN    labetalol (NORMODYNE;TRANDATE) injection 10 mg  10 mg IntraVENous Q6H PRN    [Held by provider] metoclopramide (REGLAN) tablet 5 mg  5 mg Oral 4x Daily AC & HS    ondansetron (ZOFRAN-ODT) disintegrating tablet 4 mg  4 mg Oral Q8H PRN    prochlorperazine (COMPAZINE) injection 10 mg  10 mg IntraVENous Q6H PRN    sodium chloride flush 0.9 % injection 5-40 mL  5-40 mL IntraVENous 2 times per day    sodium chloride flush 0.9 % injection 5-40 mL  5-40 mL IntraVENous PRN    0.9 % sodium chloride infusion   IntraVENous PRN    potassium chloride (KLOR-CON) extended release tablet 40 mEq  40 mEq Oral PRN    Or    potassium bicarb-citric acid (EFFER-K) effervescent tablet 40 mEq  40 mEq Oral PRN    Or    potassium chloride 10 mEq/100 mL IVPB (Peripheral Line)  10 mEq IntraVENous PRN    magnesium sulfate 2000 mg in 50 mL IVPB premix  2,000 mg IntraVENous PRN    polyethylene glycol (GLYCOLAX) packet 17 g  17 g Oral Daily PRN     acetaminophen (TYLENOL) tablet 650 mg  650 mg Oral Q6H PRN    Or    acetaminophen (TYLENOL) suppository 650 mg  650 mg Rectal Q6H PRN    pantoprazole (PROTONIX) 40 mg in sodium chloride (PF) 0.9 % 10 mL injection  40 mg IntraVENous Daily    [Held by provider] amLODIPine (NORVASC) tablet 10 mg  10 mg Oral Daily    [Held by provider] aspirin chewable tablet 81 mg  81 mg Oral Daily    [Held by provider] busPIRone (BUSPAR) tablet 15 mg  15 mg Oral TID    [Held by provider] clopidogrel (PLAVIX) tablet 75 mg  75 mg Oral Daily    dicyclomine (BENTYL) tablet 20 mg  20 mg Oral Q6H PRN    [Held by provider] DULoxetine (CYMBALTA) extended release capsule 60 mg  60 mg Oral Daily    linaclotide (LINZESS) capsule 290 mcg  290 mcg Oral QAM AC    [Held by provider] lisinopril (PRINIVIL;ZESTRIL) tablet 40 mg  40 mg Oral Daily    [Held by provider] metoprolol succinate (TOPROL XL) extended release tablet 50 mg  50 mg Oral Daily       Review of Systems: Admission ROS by Pryor Ochoa, MD from 10/21/2023 were reviewed with the patient and changes (other than per HPI) include:       Objective:     Physical Exam:  Vitals:    10/23/23 1730   BP: (!) 202/112   Pulse: (!) 124   Resp:  18   Temp: 99.7 F (37.6 C)   SpO2: 98%     SpO2 Readings from Last 6 Encounters:   10/23/23 98%   10/14/23 98%   09/22/23 98%   09/14/23 100%   08/31/23 100%   07/19/23 100%          Intake/Output Summary (Last 24 hours) at 10/23/2023 1834  Last data filed at 10/23/2023 1610  Gross per 24 hour   Intake --   Output 300 ml   Net -300 ml      General: no distress, comfortable  Skin:  No rash or palpable dermatologic mass lesions  HEENT: Pupils equal, sclera anicteric, oropharynx with no gross lesions  Cardiovascular: No abnormal audible heart sounds, well perfused, no edema  Respiratory:  No abnormal audible breath sounds, normal respiratory effort, no throacic deformity  GI:  Tenderness on palpation, Abdomen nondistended, no mass, no free fluid, no rebound or  guarding.  Musculoskeletal:  No skeletal deformity nor acute arthritis noted.  Neurological:  Motor and sensory function intact in upper extremeties  Psychiatric:  Normal affect, memory intact, appears to have insight into current illness  Lymphatic:  No cervical, supraclavicular, or periumbilic lymphadenopathy    Laboratory:    Recent Results (from the past 24 hour(s))   POCT Glucose    Collection Time: 10/22/23  9:54 PM   Result Value Ref Range    POC Glucose 274 (H) 65 - 117 mg/dL    Performed by: Guinevere Scarlet    Basic Metabolic Panel    Collection Time: 10/23/23  4:50 AM   Result Value Ref Range    Sodium 138 136 - 145 mmol/L    Potassium 3.9 3.5 - 5.1 mmol/L    Chloride 106 97 - 108 mmol/L    CO2 26 21 - 32 mmol/L    Anion Gap 6 2 - 12 mmol/L    Glucose 281 (H) 65 - 100 mg/dL    BUN 23 (H) 6 - 20 MG/DL    Creatinine 9.60 (H) 0.55 - 1.02 MG/DL    BUN/Creatinine Ratio 11 (L) 12 - 20      Est, Glom Filt Rate 30 (L) >60 ml/min/1.48m2    Calcium 8.9 8.5 - 10.1 MG/DL   Phosphorus    Collection Time: 10/23/23  4:50 AM   Result Value Ref Range    Phosphorus 5.0 (H) 2.6 - 4.7 MG/DL   Magnesium    Collection Time: 10/23/23  4:50 AM   Result Value Ref Range    Magnesium 2.3 1.6 - 2.4 mg/dL   CBC    Collection Time: 10/23/23  4:50 AM   Result Value Ref Range    WBC 8.7 3.6 - 11.0 K/uL    RBC 3.07 (L) 3.80 - 5.20 M/uL    Hemoglobin 8.9 (L) 11.5 - 16.0 g/dL    Hematocrit 45.4 (L) 35.0 - 47.0 %    MCV 87.6 80.0 - 99.0 FL    MCH 29.0 26.0 - 34.0 PG    MCHC 33.1 30.0 - 36.5 g/dL    RDW 09.8 11.9 - 14.7 %    Platelets 488 (H) 150 - 400 K/uL    MPV 8.5 (L) 8.9 - 12.9 FL    Nucleated RBCs 0.0 0 PER 100 WBC    nRBC 0.00 0.00 - 0.01 K/uL   POCT Glucose    Collection Time: 10/23/23 11:24 AM   Result Value Ref Range    POC Glucose 238 (H) 65 -  117 mg/dL    Performed by: Rutherford Limerick    POCT Glucose    Collection Time: 10/23/23  5:29 PM   Result Value Ref Range    POC Glucose 217 (H) 65 - 117 mg/dL    Performed by: Leanora Cover           Assessment/Plan:     Principal Problem:    Intractable vomiting  Resolved Problems:    * No resolved hospital problems. *       See above narrative for full detail.

## 2023-10-23 NOTE — Progress Notes (Addendum)
[facility]  [address] [place], [place] [address]  [phone]        Hospitalist Progress Note      NAME: Melinda [name]   DOB:  02-10-1981  MRM:  [id number]    Date/Time of service: 10/23/2023  12:25 PM       Subjective:     Chief Complaint:  Patient was personally seen and examined by me during this time period.  Chart reviewed.  F/up recurrent intractable nausea and vomiting    Continues to have nausea and NBNB vomiting both postprandial and spontaneous. Some epigastric pain.Otherwise no complains       Objective:       Vitals:       Last 24hrs VS reviewed since prior progress note. Most recent are:    Vitals:    10/23/23 1115   BP: (!) 187/107   Pulse: (!) 123   Resp: 18   Temp: 98.8 F (37.1 C)   SpO2: 97%     SpO2 Readings from Last 6 Encounters:   10/23/23 97%   10/14/23 98%   09/22/23 98%   09/14/23 100%   08/31/23 100%   07/19/23 100%          Intake/Output Summary (Last 24 hours) at 10/23/2023 1225  Last data filed at 10/23/2023 0836  Gross per 24 hour   Intake 640 ml   Output 700 ml   Net -60 ml        Exam:     Physical Exam:    Gen:  in no acute distress  HEENT:  Pink conjunctivae, eomi, hearing intact to voice, moist mucous membranes  Resp:  No accessory muscle use, clear breath sounds without wheezes rales or rhonchi  Card:  No murmurs, normal S1, S2 without thrills, bruits or peripheral edema  Abd:  Epigastric TTP. Otherwise no tenderness  Musc:  No cyanosis or clubbing  Skin:  No rashes or ulcers, skin turgor is good  Neuro:  follows commands appropriately  Psych:  Good insight, oriented to person, place and time, alert      Medications Reviewed: (see below)    Lab Data Reviewed: (see below)    ______________________________________________________________________    Medications:     Current Facility-Administered Medications   Medication Dose Route Frequency    glucose chewable tablet 16 g  4 tablet Oral PRN    dextrose bolus 10% 125 mL  125 mL IntraVENous  PRN    Or    dextrose bolus 10% 250 mL  250 mL IntraVENous PRN    glucagon injection 1 mg  1 mg SubCUTAneous PRN    dextrose 10 % infusion   IntraVENous Continuous PRN    insulin lispro (HUMALOG,ADMELOG) injection vial 0-8 Units  0-8 Units SubCUTAneous 4 times per day    labetalol (NORMODYNE;TRANDATE) 300 mg in sodium chloride 0.9 % 300 mL infusion  0.5-3 mg/min IntraVENous Continuous    metoclopramide (REGLAN) injection 5 mg  5 mg IntraVENous Q6H    [Held by provider] metoclopramide (REGLAN) tablet 5 mg  5 mg Oral 4x Daily AC & HS    ondansetron (ZOFRAN-ODT) disintegrating tablet 4 mg  4 mg Oral Q8H PRN    0.9 % sodium chloride infusion   IntraVENous Continuous    prochlorperazine (COMPAZINE) injection 10 mg  10 mg IntraVENous Q6H PRN    sodium chloride flush 0.9 % injection 5-40 mL  5-40 mL IntraVENous 2 times per day  sodium chloride flush 0.9 % injection 5-40 mL  5-40 mL IntraVENous PRN    0.9 % sodium chloride infusion   IntraVENous PRN    potassium chloride (KLOR-CON) extended release tablet 40 mEq  40 mEq Oral PRN    Or    potassium bicarb-citric acid (EFFER-K) effervescent tablet 40 mEq  40 mEq Oral PRN    Or    potassium chloride 10 mEq/100 mL IVPB (Peripheral Line)  10 mEq IntraVENous PRN    magnesium sulfate 2000 mg in 50 mL IVPB premix  2,000 mg IntraVENous PRN    enoxaparin (LOVENOX) injection 40 mg  40 mg SubCUTAneous Daily    polyethylene glycol (GLYCOLAX) packet 17 g  17 g Oral Daily PRN    acetaminophen (TYLENOL) tablet 650 mg  650 mg Oral Q6H PRN    Or    acetaminophen (TYLENOL) suppository 650 mg  650 mg Rectal Q6H PRN    pantoprazole (PROTONIX) 40 mg in sodium chloride (PF) 0.9 % 10 mL injection  40 mg IntraVENous Daily    [Held by provider] amLODIPine (NORVASC) tablet 10 mg  10 mg Oral Daily    [Held by provider] aspirin chewable tablet 81 mg  81 mg Oral Daily    [Held by provider] busPIRone (BUSPAR) tablet 15 mg  15 mg Oral TID    [Held by provider] clopidogrel (PLAVIX) tablet 75 mg  75 mg  Oral Daily    dicyclomine (BENTYL) tablet 20 mg  20 mg Oral Q6H PRN    [Held by provider] DULoxetine (CYMBALTA) extended release capsule 60 mg  60 mg Oral Daily    linaclotide (LINZESS) capsule 290 mcg  290 mcg Oral QAM AC    [Held by provider] lisinopril (PRINIVIL;ZESTRIL) tablet 40 mg  40 mg Oral Daily    [Held by provider] metoprolol succinate (TOPROL XL) extended release tablet 50 mg  50 mg Oral Daily    hydrALAZINE (APRESOLINE) injection 10 mg  10 mg IntraVENous Q6H PRN          Lab Review:     Recent Labs     10/21/23  0618 10/22/23  0310 10/23/23  0450   WBC 14.4* 8.3 8.7   HGB 10.2* 7.4* 8.9*   HCT 30.3* 23.0* 26.9*   PLT 619* 415* 488*     Recent Labs     10/21/23  0618 10/22/23  0310 10/23/23  0450   NA 140 143 138   K 3.4* 3.8 3.9   CL 107 113* 106   CO2 27 26 26    BUN 26* 26* 23*   MG 1.9  --  2.3   PHOS  --   --  5.0*   ALT 47  --   --      No results found for: "GLUCPOC"       Assessment / Plan:     Intractable nausea vomiting and abdominal pain: POA. Recurrent. She has had an extensive workup spanning several years for the above symptoms. CT abd NAP. She has a history of gastroparesis with a recent delayed gastric emptying study on 1/2. Not improving. Refractory to off label steroids  - GI consult  - Hold all PO meds  - Reglan, zofran, compazine IV  - PPI  - CLD ADAT  - Cont IVF  - Watch lytes    Hypertensive urgency: POA. Suspect due to the above issues and also uncertain if she is compliant with her home medications  - Hold home meds  -  Start labetalol drip     AKI on CKD  Hypokalemia  -Suspect IVVD from vomiting  -Monitor with IV fluids     SIRS  -Triggered due to leukocytosis, tachycardia but suspect this is all due to IVVD and vomiting  -Monitor with fluids  -UA pending     Type 2 diabetes  -Recent A1c 7.5  -Continue home insulin  -SSI per protocol     History of SMA stenosis  -Recent SMA stenting on 12/23  -Continue aspirin and Plavix     Substance use disorder  - not on methadone or other  opioids  - UDS    Qtc prolongation: POA.  - try to qvoid Qtc prolonging meds    I have provided a total of 40 minutes of critical care time.  During this entire length of time I was immediately available to the patient. The reason for providing this level of medical care was due to a critical illness that impaired one or more vital organ systems, such that there was a high probability of imminent or life threatening deterioration in the patient's condition. This care involved high complexity decision making which includes reviewing the patient's past medical records, current laboratory results, and actual Xray films in order to assess, support vital system function, and to treat this degree of vital organ system failure, and to prevent further life threatening deterioration of the patient's condition.        POLST: Patient not ready    **Prior records, notes, labs, radiology, and medications reviewed in [facility]**               Care Plan discussed with: Patient, Care Manager, and Nursing Staff    Discussed:  Care Plan and D/C Planning    Prophylaxis:  Lovenox    Disposition:  Home w/Family           ___________________________________________________    Attending Physician: Renelda Loma, MD

## 2023-10-23 NOTE — Progress Notes (Signed)
[facility] Lovenox Dosing  Lovenox dose change per protocol  Physician:     [facility] Protocol  Enoxaparin prophylaxis dosing (medically ill, surgical patients)             Patient Weight (kg)      50 and below 51 - 100 101 - 150 151 - 174 175 or greater            Estimated CrCl  (ml/min) 30 or greater    30 mg SUBQ daily    40 mg SUBQ daily (or 30 mg BID for ortho) 30 mg SUBQ BID  40 mg SUBQ BID 60mg  SUBQ BID       15-29 UFH 5000 units SUBQ BID    30 mg SUBQ daily 30 mg SUBQ daily 40 mg SUBQ daily    60 mg SUBQ daily       Less than 15 or Dialysis UFH 5000 units SUBQ BID    UFH 5000 units SUBQ TID UFH 7500 units SUBQ TID      Estimated Creatinine Clearance:   Current dose: Lovenox 40 mg daily  Recommendation: Lovenox 30 mg daily     Thank you        Shawnie Pons, PharmD, BCPS

## 2023-10-23 NOTE — Progress Notes (Signed)
1245: RRT RN reviewing patient's chart d/t rapid response last night. Noticed that patient has labetalol gtt ordered since 0930. Patient upgraded to tele/IMCU level of care. S/w primary RN and MD d/t titratable labetalol gtt needing ICU level of care. Will transfer to ICU and start gtt.     1300: Patient placed on lifepak. BP 187/103. HR 118. Labetalol gtt initiated. Patient assigned to bed 473. Primary RN made aware and will call report. Patient denies chest pan, SOB, dizziness, or headache at this time- reports abdominal pain.     1320: Dr Jodelle Gross made aware of patient's BP 162/92. HR 108. No titration to labetalol.     1338: Dr A spoke with intensivist who made recommendations. Will d/c gtt and stay on current unit. Primary RN made aware.

## 2023-10-23 NOTE — Plan of Care (Signed)
Problem: Pain  Goal: Verbalizes/displays adequate comfort level or baseline comfort level  10/23/2023 1035 by Thad Ranger, RN  Outcome: Not Progressing  10/23/2023 0313 by Orlene Plum, RN  Outcome: Progressing     Problem: Pain  Goal: Verbalizes/displays adequate comfort level or baseline comfort level  10/23/2023 1035 by Thad Ranger, RN  Outcome: Not Progressing  10/23/2023 0313 by Orlene Plum, RN  Outcome: Progressing

## 2023-10-23 NOTE — Discharge Instructions (Addendum)
HOSPITALIST DISCHARGE INSTRUCTIONS  NAME:  Melinda [name]   DOB:  November 09, 1980   MRN:  [id number]     Date/Time:  10/25/2023 10:52 AM    ADMIT DATE: 10/21/2023     DISCHARGE DATE: 10/25/2023     DISCHARGE DIAGNOSIS:  Intractable nausea and vomiting due to gastroparesis    DISCHARGE INSTRUCTIONS:  Thank you for allowing [place] to participate in your care. Your discharging Hospitalist is Renelda Loma, MD. [name] were admitted for evaluation and treatment of the above. You were seen by a GI doctor and given medications for nausea. Your symptoms improved and your are well enough to be discharged now. Please take your meds as prescribed and follow up with your PCP and specialists (contact details in this packet)      MEDICATIONS:    It is important that you take the medication exactly as they are prescribed.   Keep your medication in the bottles provided by the pharmacist and keep a list of the medication names, dosages, and times to be taken in your wallet.   Do not take other medications without consulting your doctor.             If you experience any of the following symptoms then please call your primary care physician or return to the emergency room if you cannot get hold of your doctor:  Fever, chills, nausea, vomiting, diarrhea, change in mentation, falling, bleeding, shortness of breath    Follow Up:  Please call the below provider to arrange hospital follow up appointment      Orlean Bradford, APRN - NP  [address]  [place] [address]  [phone]    Follow up      Jeannette How, MD  [address] [facility]  [address]  [place] [address]  [phone]    Follow up      Kathaleen Bury, MD  [address]  [address]  [place] [address]  [phone]    Follow up          Information obtained by :  I understand that if any problems occur once I am at home I am to contact my physician.    I understand and acknowledge receipt of the instructions indicated above.                                                                                                                                            Physician's or R.N.'s Signature                                                                  Date/Time  Patient or Sales promotion account executive

## 2023-10-23 NOTE — Progress Notes (Signed)
Chart accessed as patient assigned to ICU.   After ICU MD and Attending MD discussion, plans to treat patient with IVP antihypertensives, stop labetalol gtt and keep patient on MSTU. No further needs from ICU team.

## 2023-10-23 NOTE — Progress Notes (Signed)
[facility] Dosing Services     Metoclopramide was automatically dose-adjusted per [facility] [employer] P&T Committee Protocol, with respect to renal function.       Assessment/Plan: Estimated Creatinine Clearance: 27 mL/min (A) (based on SCr of 2.09 mg/dL (H)). Metoclopramide changed to 5 mg four times daily before meals and nightly per P&T approved protocol for patient with CrCl 10-60  mL/min.     Creatinine Clearance Estimated Creatinine Clearance: 27 mL/min (A) (based on SCr of 2.09 mg/dL (H)).   BUN       Lab Results   Component Value Date/Time     BUN 26 10/22/2023 03:10 AM          Pharmacist Volanda Napoleon, RPH

## 2023-10-23 NOTE — Plan of Care (Signed)
Problem: Chronic Conditions and Co-morbidities  Goal: Patient's chronic conditions and co-morbidity symptoms are monitored and maintained or improved  10/23/2023 0313 by Orlene Plum, RN  Outcome: Progressing  10/22/2023 1936 by Dimas Chyle, RN  Outcome: Progressing     Problem: Safety - Adult  Goal: Free from fall injury  10/23/2023 0313 by Orlene Plum, RN  Outcome: Progressing  10/22/2023 1936 by Dimas Chyle, RN  Outcome: Adequate for Discharge     Problem: Discharge Planning  Goal: Discharge to home or other facility with appropriate resources  10/23/2023 0313 by Orlene Plum, RN  Outcome: Progressing  10/22/2023 1936 by Dimas Chyle, RN  Outcome: Not Progressing  Flowsheets (Taken 10/22/2023 [phone])  Discharge to home or other facility with appropriate resources: Identify barriers to discharge with patient and caregiver     Problem: Pain  Goal: Verbalizes/displays adequate comfort level or baseline comfort level  10/23/2023 0313 by Orlene Plum, RN  Outcome: Progressing  10/22/2023 1936 by Dimas Chyle, RN  Outcome: Not Progressing

## 2023-10-24 LAB — POCT GLUCOSE
POC Glucose: 232 mg/dL — ABNORMAL HIGH (ref 65–117)
POC Glucose: 204 mg/dL — ABNORMAL HIGH (ref 65–117)
POC Glucose: 234 mg/dL — ABNORMAL HIGH (ref 65–117)
POC Glucose: 159 mg/dL — ABNORMAL HIGH (ref 65–117)

## 2023-10-24 LAB — HEMOGLOBIN A1C
Estimated Avg Glucose: 148 mg/dL
Hemoglobin A1C: 6.8 % — ABNORMAL HIGH (ref 4.0–5.6)

## 2023-10-24 MED ORDER — POLYETHYLENE GLYCOL 3350 17 G PO PACK
17 g | Freq: Every day | ORAL | Status: DC
Start: 2023-10-24 — End: 2023-10-25
  Administered 2023-10-24: 18:00:00 17 g via ORAL

## 2023-10-24 MED ORDER — INSULIN GLARGINE 100 UNIT/ML SC SOLN
100 UNIT/ML | Freq: Every evening | SUBCUTANEOUS | Status: DC
Start: 2023-10-24 — End: 2023-10-25
  Administered 2023-10-25: 02:00:00 4 [IU] via SUBCUTANEOUS

## 2023-10-24 MED FILL — INSULIN LISPRO 100 UNIT/ML IJ SOLN: 100 UNIT/ML | INTRAMUSCULAR | Qty: 2

## 2023-10-24 MED FILL — DICYCLOMINE HCL 20 MG PO TABS: 20 MG | ORAL | Qty: 1

## 2023-10-24 MED FILL — ERYTHROCIN LACTOBIONATE 500 MG IV SOLR: 500 MG | INTRAVENOUS | Qty: 125

## 2023-10-24 MED FILL — LORAZEPAM 2 MG/ML IJ SOLN: 2 MG/ML | INTRAMUSCULAR | Qty: 1

## 2023-10-24 MED FILL — METOCLOPRAMIDE HCL 5 MG/ML IJ SOLN: 5 MG/ML | INTRAMUSCULAR | Qty: 2

## 2023-10-24 MED FILL — PANTOPRAZOLE SODIUM 40 MG IV SOLR: 40 MG | INTRAVENOUS | Qty: 40

## 2023-10-24 MED FILL — ENOXAPARIN SODIUM 30 MG/0.3ML IJ SOSY: 30 MG/0.3ML | INTRAMUSCULAR | Qty: 0.3

## 2023-10-24 MED FILL — HYDRALAZINE HCL 20 MG/ML IJ SOLN: 20 MG/ML | INTRAMUSCULAR | Qty: 1

## 2023-10-24 MED FILL — LABETALOL HCL 5 MG/ML IV SOLN: 5 MG/ML | INTRAVENOUS | Qty: 4

## 2023-10-24 MED FILL — POLYETHYLENE GLYCOL 3350 17 G PO PACK: 17 g | ORAL | Qty: 1

## 2023-10-24 MED FILL — ACETAMINOPHEN 325 MG PO TABS: 325 MG | ORAL | Qty: 2

## 2023-10-24 MED FILL — LINZESS 145 MCG PO CAPS: 145 MCG | ORAL | Qty: 2

## 2023-10-24 MED FILL — PROCHLORPERAZINE EDISYLATE 10 MG/2ML IJ SOLN: 10 MG/2ML | INTRAMUSCULAR | Qty: 2

## 2023-10-24 NOTE — Care Coordination-Inpatient (Addendum)
Care Management Initial Assessment  10/24/2023 2:49 PM  If patient is discharged prior to next notation, then this note serves as note for discharge by case management.    Reason for Admission:   Epigastric pain [R10.13]  Intractable vomiting [R11.10]  Uncontrolled hypertension [I10]  Intractable nausea and vomiting [R11.2]         Patient Admission Status: Inpatient  Date Admitted to INP: 10/21/23  RUR: Readmission Risk Score: 40.2    Hospitalization in the last 30 days (Readmission):  Yes        Advance Care Planning:  Code Status: Full Code  Primary Healthcare Decision Maker:    Primary Decision Maker: Melinda [name] Child [phone]   Advance Directive:      __________________________________________________________________________  Assessment: Completed with assistance of [employer] Language Interpreter Melinda [name] [phone].     10/24/23 1449   Service Assessment   Patient Orientation Alert and Oriented;Person;Place;Situation;Self   Cognition Alert   History Provided By Patient   Primary Caregiver Self   Support Systems Spouse/Significant Other;Children   PCP Verified by CM Yes  ([facility])   Last Visit to PCP Within last 6 months   Prior Functional Level Independent in ADLs/IADLs   Current Functional Level Independent in ADLs/IADLs   Can patient return to prior living arrangement Yes   Ability to make needs known: Good   Family able to assist with home care needs: Yes   Financial Resources [employer] None   Social/Functional History   Lives With Spouse   Type of Home House   Bathroom Shower/Tub None   Bathroom Equipment None   Home Equipment None   Receives Help From Family   Prior Level of Assist for ADLs Independent   Prior Level of Assist for [employer] Independent   Ambulation Assistance Independent   Prior Level of Assist for Transfers Independent   Mode of Engineer, water   Discharge Planning   Type of Residence House   Living Arrangements Spouse/Significant Other;Children    Current Services Prior To Admission None   Potential Assistance Needed N/A  (TBD)   DME Ordered? No   Potential Assistance Purchasing Medications No  ([facility])   Patient expects to be discharged to: [facility] At/After Discharge   Transition of Care Consult (CM Consult) N/A   Services At/After Discharge None   Confirm Follow Up Transport Family   Condition of Participation: Discharge Planning   The Plan for Transition of Care is related to the following treatment goals: Home         Comments: CM met with patient to complete initial assessment. Patient lives at home with her husband and children. She reports independence at home with no DME use. Patient has history of CKD, DM type 2, gastroparesis, GERD, high cholesterol and hypertension. GI consulted, possible EGD and DC home with no CM needs.     Discharge Concerns: [] Yes [x] No [] Unknown   Describe:    Financial concerns/barriers: [] Yes, explain: [x] No [] Unknown/Not discussed  __________________________________________________________________________    Insurer:   Active Insurance as of 10/21/2023       Primary Coverage       Payor Plan Insurance Group Employer/Plan Group    [place]        Payor Plan Address Payor Plan Phone Number Payor Plan Fax Number Effective Dates    [phone]   05/28/2023 - None [place] [address]  Subscriber Name Subscriber Birth Date Member ID       Melinda [name], Melinda [name] 1980-12-23 [id number]                     PCP: Melinda Bradford, APRN - NP   Address: [address] / [place] [address]   Phone number: [phone]    Pharmacy:   [facility] [address], [place] [address] [place] [phone] - F [phone]  [address]  [place] [address]  Phone: [phone] Fax: [phone]    DC Transport: (P) Family       Transition of care plan:    [x] Unable to determine at this time. Awaiting clinical progress, and disposition recommendations.    []  Home. No assistance  required.     []  Home. Pt refused recommended services.    []  Home with family assistance as needed, and outpatient follow-up.    []  Home with Outpatient PT and outpatient follow-up   Pt aware of OP appt? [] Yes, Provider:   [] Not scheduled   Transport provider:     []  Home with outpatient services.    Specify:    []  Home with Home Health   - Freedom of Choice offered? []  Yes, Preference:   []  NA    [] SNF/IPR   -[] Freedom of Choice offered, and preferences given:   [] Listing provided and preferences requested   -Status: [] Pending [] Accepted:    -Auth required: [] Yes [] No    -Auth initiated date:   -3 midnight stay required: [] Yes [] No  Date satisfied:     []  LTC:     []  Home with Hospice   - Freedom of Choice offered? []  Yes, Preference:   []  NA    []  [facility] Health information provided.     []  Other:       Almira Coaster  Case Management Department  For questions or concerns, please [employer]  [phone]

## 2023-10-24 NOTE — Progress Notes (Signed)
[facility]  [address] Leonette Monarch [place], [place] [address]  [phone]        Hospitalist Progress Note      NAME: Melinda [name]   DOB:  Dec 30, 1980  MRM:  [id number]    Date/Time of service: 10/24/2023  11:23 AM       Subjective:     Chief Complaint:  Patient was personally seen and examined by me during this time period.  Chart reviewed.  F/up recurrent intractable nausea and vomiting    Continues to have nausea and NBNB vomiting both postprandial and spontaneous.Improved from yesterday       Objective:       Vitals:       Last 24hrs VS reviewed since prior progress note. Most recent are:    Vitals:    10/24/23 0816   BP: (!) 199/110   Pulse: (!) 124   Resp: 18   Temp: 99 F (37.2 C)   SpO2: 99%     SpO2 Readings from Last 6 Encounters:   10/24/23 99%   10/14/23 98%   09/22/23 98%   09/14/23 100%   08/31/23 100%   07/19/23 100%        No intake or output data in the 24 hours ending 10/24/23 1123       Exam:     Physical Exam:    Gen:  in no acute distress  HEENT:  Pink conjunctivae, eomi, hearing intact to voice, moist mucous membranes  Resp:  No accessory muscle use, clear breath sounds without wheezes rales or rhonchi  Card:  No murmurs, normal S1, S2 without thrills, bruits or peripheral edema  Abd:  Epigastric TTP. Otherwise no tenderness  Musc:  No cyanosis or clubbing  Skin:  No rashes or ulcers, skin turgor is good  Neuro:  follows commands appropriately  Psych:  Good insight, oriented to person, place and time, alert      Medications Reviewed: (see below)    Lab Data Reviewed: (see below)    ______________________________________________________________________    Medications:     Current Facility-Administered Medications   Medication Dose Route Frequency    insulin glargine (LANTUS) injection vial 4 Units  4 Units SubCUTAneous Nightly    glucose chewable tablet 16 g  4 tablet Oral PRN    dextrose bolus 10% 125 mL  125 mL IntraVENous PRN    Or    dextrose bolus 10% 250  mL  250 mL IntraVENous PRN    glucagon injection 1 mg  1 mg SubCUTAneous PRN    dextrose 10 % infusion   IntraVENous Continuous PRN    insulin lispro (HUMALOG,ADMELOG) injection vial 0-8 Units  0-8 Units SubCUTAneous 4 times per day    metoclopramide (REGLAN) injection 5 mg  5 mg IntraVENous Q6H    enoxaparin Sodium (LOVENOX) injection 30 mg  30 mg SubCUTAneous Daily    erythromycin (ERYTHROCIN) 125 mg in sodium chloride 0.9 % 100 mL IVPB  125 mg IntraVENous Q6H    cloNIDine (CATAPRES) 0.1 MG/24HR 1 patch  1 patch TransDERmal Weekly    LORazepam (ATIVAN) injection 0.5 mg  0.5 mg IntraVENous Q6H    hydrALAZINE (APRESOLINE) injection 20 mg  20 mg IntraVENous Q6H PRN    labetalol (NORMODYNE;TRANDATE) injection 10 mg  10 mg IntraVENous Q6H PRN    [Held by provider] metoclopramide (REGLAN) tablet 5 mg  5 mg Oral 4x Daily AC & HS    ondansetron (ZOFRAN-ODT) disintegrating  tablet 4 mg  4 mg Oral Q8H PRN    prochlorperazine (COMPAZINE) injection 10 mg  10 mg IntraVENous Q6H PRN    sodium chloride flush 0.9 % injection 5-40 mL  5-40 mL IntraVENous 2 times per day    sodium chloride flush 0.9 % injection 5-40 mL  5-40 mL IntraVENous PRN    0.9 % sodium chloride infusion   IntraVENous PRN    potassium chloride (KLOR-CON) extended release tablet 40 mEq  40 mEq Oral PRN    Or    potassium bicarb-citric acid (EFFER-K) effervescent tablet 40 mEq  40 mEq Oral PRN    Or    potassium chloride 10 mEq/100 mL IVPB (Peripheral Line)  10 mEq IntraVENous PRN    magnesium sulfate 2000 mg in 50 mL IVPB premix  2,000 mg IntraVENous PRN    polyethylene glycol (GLYCOLAX) packet 17 g  17 g Oral Daily PRN    acetaminophen (TYLENOL) tablet 650 mg  650 mg Oral Q6H PRN    Or    acetaminophen (TYLENOL) suppository 650 mg  650 mg Rectal Q6H PRN    pantoprazole (PROTONIX) 40 mg in sodium chloride (PF) 0.9 % 10 mL injection  40 mg IntraVENous Daily    [Held by provider] amLODIPine (NORVASC) tablet 10 mg  10 mg Oral Daily    [Held by provider] aspirin  chewable tablet 81 mg  81 mg Oral Daily    [Held by provider] busPIRone (BUSPAR) tablet 15 mg  15 mg Oral TID    [Held by provider] clopidogrel (PLAVIX) tablet 75 mg  75 mg Oral Daily    dicyclomine (BENTYL) tablet 20 mg  20 mg Oral Q6H PRN    [Held by provider] DULoxetine (CYMBALTA) extended release capsule 60 mg  60 mg Oral Daily    linaclotide (LINZESS) capsule 290 mcg  290 mcg Oral QAM AC    [Held by provider] lisinopril (PRINIVIL;ZESTRIL) tablet 40 mg  40 mg Oral Daily    [Held by provider] metoprolol succinate (TOPROL XL) extended release tablet 50 mg  50 mg Oral Daily          Lab Review:     Recent Labs     10/22/23  0310 10/23/23  0450   WBC 8.3 8.7   HGB 7.4* 8.9*   HCT 23.0* 26.9*   PLT 415* 488*     Recent Labs     10/22/23  0310 10/23/23  0450   NA 143 138   K 3.8 3.9   CL 113* 106   CO2 26 26   BUN 26* 23*   MG  --  2.3   PHOS  --  5.0*     No results found for: "GLUCPOC"       Assessment / Plan:     Intractable nausea vomiting and abdominal pain: POA. Recurrent. She has had an extensive workup spanning several years for the above symptoms. CT abd NAP. She has a history of gastroparesis with a recent delayed gastric emptying study on 1/2. Some improvement  - appreciate GI  - Hold all PO meds  - Reglan, zofran, compazine  erythromycin, ativan IV  - PPI  - CLD ADAT  - Cont IVF  - Watch lytes    Hypertensive urgency: POA. Suspect due to the above issues and also uncertain if she is compliant with her home medications  - Hold home meds  - clonidine patch  - IV hydral and labetalol PRN    Tachycardia:  POA. 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  - try to qvoid Qtc prolonging meds    POLST: Patient not ready    **Prior records, notes, labs, radiology, and medications reviewed in [facility]**               Care Plan discussed with: Patient, Care Manager, and Nursing Staff    Discussed:  Care Plan and D/C Planning    Prophylaxis:  Lovenox    Disposition:  Home w/Family           ___________________________________________________    Attending Physician: Renelda Loma, MD

## 2023-10-24 NOTE — Progress Notes (Signed)
Provided interpreter services on-site to Dr. Jodelle Gross during patient assessment.  Opportunity for questions and clarification was provided.            Lynne Logan, CMI  [facility] Senior Interpreter - Navigator  [phone]

## 2023-10-24 NOTE — Progress Notes (Signed)
Charlcie Cradle, ACNP-BC  [phone] office  [phone] voicemail     Gastroenterology Progress Note    October 24, 2023  Admit Date: 10/21/2023         Narrative Assessment and Plan   Persistent abdominal pain with nausea/vomiting in the setting of uncontrolled diabetes and recent diagnosis of gastroparesis based on gastric emptying study (Study was done inpatient, patient was on methadone and it was not a 4 hour study, so interpretation with caution).  CT abd/pelvis 1/10 without any acute abnormality    Impression:  Intractable nausea/vomiting  Hypertensive emergency  Abdominal pain  Diabetic gastroparesis  Hyperglycemia    10/23/22 - Denies pain. No N/V and tolerating clears.     Rec:  Advance diet as tolerated  Continue Reglan, compazine   Strict glycemic control as hyperglycemia can worsen gastroparesis symptoms  Continue bowel regimen to avoid constipation, add Miralax to Linzess  Will hold off repeat EGD at this time  Possibly home if able to tolerate PO  Consider Elavil for functional abdominal pain if pain persists      Subjective:   Denies pain today. No N/V and tolerating clears. No BM in 3 days.    ROS:  The previous review of systems on initial consultation / H&P is noted and reviewed.  Specific changes noted above in HPI.    Current Medications:     Current Facility-Administered Medications   Medication Dose Route Frequency    insulin glargine (LANTUS) injection vial 4 Units  4 Units SubCUTAneous Nightly    glucose chewable tablet 16 g  4 tablet Oral PRN    dextrose bolus 10% 125 mL  125 mL IntraVENous PRN    Or    dextrose bolus 10% 250 mL  250 mL IntraVENous PRN    glucagon injection 1 mg  1 mg SubCUTAneous PRN    dextrose 10 % infusion   IntraVENous Continuous PRN    insulin lispro (HUMALOG,ADMELOG) injection vial 0-8 Units  0-8 Units SubCUTAneous 4 times per day    metoclopramide (REGLAN) injection 5 mg  5 mg IntraVENous Q6H    enoxaparin Sodium (LOVENOX) injection 30 mg   30 mg SubCUTAneous Daily    erythromycin (ERYTHROCIN) 125 mg in sodium chloride 0.9 % 100 mL IVPB  125 mg IntraVENous Q6H    cloNIDine (CATAPRES) 0.1 MG/24HR 1 patch  1 patch TransDERmal Weekly    LORazepam (ATIVAN) injection 0.5 mg  0.5 mg IntraVENous Q6H    hydrALAZINE (APRESOLINE) injection 20 mg  20 mg IntraVENous Q6H PRN    labetalol (NORMODYNE;TRANDATE) injection 10 mg  10 mg IntraVENous Q6H PRN    [Held by provider] metoclopramide (REGLAN) tablet 5 mg  5 mg Oral 4x Daily AC & HS    ondansetron (ZOFRAN-ODT) disintegrating tablet 4 mg  4 mg Oral Q8H PRN    prochlorperazine (COMPAZINE) injection 10 mg  10 mg IntraVENous Q6H PRN    sodium chloride flush 0.9 % injection 5-40 mL  5-40 mL IntraVENous 2 times per day    sodium chloride flush 0.9 % injection 5-40 mL  5-40 mL IntraVENous PRN    0.9 % sodium chloride infusion   IntraVENous PRN    potassium chloride (KLOR-CON) extended release tablet 40 mEq  40 mEq Oral PRN    Or    potassium bicarb-citric acid (EFFER-K) effervescent tablet 40 mEq  40 mEq Oral PRN    Or    potassium chloride 10 mEq/100 mL IVPB (Peripheral Line)  10 mEq IntraVENous PRN    magnesium sulfate 2000 mg in 50 mL IVPB premix  2,000 mg IntraVENous PRN    polyethylene glycol (GLYCOLAX) packet 17 g  17 g Oral Daily PRN    acetaminophen (TYLENOL) tablet 650 mg  650 mg Oral Q6H PRN    Or    acetaminophen (TYLENOL) suppository 650 mg  650 mg Rectal Q6H PRN    pantoprazole (PROTONIX) 40 mg in sodium chloride (PF) 0.9 % 10 mL injection  40 mg IntraVENous Daily    [Held by provider] amLODIPine (NORVASC) tablet 10 mg  10 mg Oral Daily    [Held by provider] aspirin chewable tablet 81 mg  81 mg Oral Daily    [Held by provider] busPIRone (BUSPAR) tablet 15 mg  15 mg Oral TID    [Held by provider] clopidogrel (PLAVIX) tablet 75 mg  75 mg Oral Daily    dicyclomine (BENTYL) tablet 20 mg  20 mg Oral Q6H PRN    [Held by provider] DULoxetine (CYMBALTA) extended release capsule 60 mg  60 mg Oral Daily     linaclotide (LINZESS) capsule 290 mcg  290 mcg Oral QAM AC    [Held by provider] lisinopril (PRINIVIL;ZESTRIL) tablet 40 mg  40 mg Oral Daily    [Held by provider] metoprolol succinate (TOPROL XL) extended release tablet 50 mg  50 mg Oral Daily       Objective:     VITALS:   Last 24hrs VS reviewed since prior progress note. Most recent are:  Vitals:    10/24/23 0816   BP: (!) 199/110   Pulse: (!) 124   Resp: 18   Temp: 99 F (37.2 C)   SpO2: 99%     Temp (24hrs), Avg:99 F (37.2 C), Min:98.8 F (37.1 C), Max:99.7 F (37.6 C)    No intake or output data in the 24 hours ending 10/24/23 1035    EXAM:  General:  Comfortable, no distress    HEENT:  Atraumatic skull, pupils equal  Lungs:   No abnormal audible breath sounds.  Speaking in complete sentences  Heart:   No abnormal audible heart sounds.  Well perfused  Abdomen:  Non-distended, non-tender.  No mass, guarding or rebound  Musc:   No skeletal defects or deformities  Neurologic:   Cranial nerves grossly intact, moves all 4 extremities  Psych:    Good insight. Not anxious nor agitated  Derm:   No rash, jaundice, nor palpable dermatologic mass    Lab Data Reviewed:   Recent Labs     10/22/23  0310 10/23/23  0450   WBC 8.3 8.7   HGB 7.4* 8.9*   HCT 23.0* 26.9*   PLT 415* 488*     Recent Labs     10/22/23  0310 10/23/23  0450   NA 143 138   K 3.8 3.9   CL 113* 106   CO2 26 26   BUN 26* 23*   MG  --  2.3   PHOS  --  5.0*     No results found for: "GLUCPOC"  No results for input(s): "PH", "PCO2", "PO2", "HCO3", "FIO2" in the last 72 hours.  No results for input(s): "INR" in the last 72 hours.        Assessment:   (See above)  Principal Problem:    Intractable vomiting  Resolved Problems:    * No resolved hospital problems. *      Plan:   (See above)  Signed By: Moise Boring, APRN - NP     10/24/2023  10:35 AM

## 2023-10-24 NOTE — Plan of Care (Signed)
Problem: Chronic Conditions and Co-morbidities  Goal: Patient's chronic conditions and co-morbidity symptoms are monitored and maintained or improved  10/24/2023 1140 by Thad Ranger, RN  Outcome: Progressing  10/23/2023 2329 by Lurlean Nanny, RN  Outcome: Progressing     Problem: Discharge Planning  Goal: Discharge to home or other facility with appropriate resources  10/24/2023 1140 by Thad Ranger, RN  Outcome: Progressing  10/23/2023 2329 by Lurlean Nanny, RN  Outcome: Progressing     Problem: Pain  Goal: Verbalizes/displays adequate comfort level or baseline comfort level  10/24/2023 1140 by Thad Ranger, RN  Outcome: Progressing  Flowsheets (Taken 10/24/2023 0012 by Lurlean Nanny, RN)  Verbalizes/displays adequate comfort level or baseline comfort level:   Encourage patient to monitor pain and request assistance   Assess pain using appropriate pain scale   Administer analgesics based on type and severity of pain and evaluate response   Implement non-pharmacological measures as appropriate and evaluate response   Consider cultural and social influences on pain and pain management   Notify Licensed Independent Practitioner if interventions unsuccessful or patient reports new pain  10/23/2023 2329 by Lurlean Nanny, RN  Outcome: Progressing  Flowsheets (Taken 10/23/2023 2000)  Verbalizes/displays adequate comfort level or baseline comfort level:   Encourage patient to monitor pain and request assistance   Assess pain using appropriate pain scale   Administer analgesics based on type and severity of pain and evaluate response   Implement non-pharmacological measures as appropriate and evaluate response   Consider cultural and social influences on pain and pain management   Notify Licensed Independent Practitioner if interventions unsuccessful or patient reports new pain     Problem: Safety - Adult  Goal: Free from fall injury  10/24/2023 1140 by Thad Ranger, RN  Outcome: Progressing  10/23/2023 2329 by Lurlean Nanny, RN  Outcome: Progressing

## 2023-10-24 NOTE — Progress Notes (Signed)
Physician Progress Note      PATIENTMarland [name]               AMBI, FEJES  CSN #:                  [id number]  DOB:                       1980/12/11  ADMIT DATE:       10/21/2023 5:59 AM  DISCH DATE:  RESPONDING  PROVIDER #:        Renelda Loma MD        QUERY TEXT:    Stage of Chronic Kidney Disease: Please provide further specificity, if known.    Clinical indicators include: ckd (chronic kidney disease, bun, creatinine,   bun/creatinine, ckd  Options provided:  -- Chronic kidney disease stage 1  -- Chronic kidney disease stage 2  -- Chronic kidney disease stage 3  -- Chronic kidney disease stage 3a  -- Chronic kidney disease stage 3b  -- Chronic kidney disease stage 4  -- Chronic kidney disease stage 5  -- Chronic kidney disease stage 5, requiring dialysis  -- End stage renal disease  -- Other - I will add my own diagnosis  -- Disagree - Not applicable / Not valid  -- Disagree - Clinically Unable to determine / Unknown        PROVIDER RESPONSE TEXT:    The patient has chronic kidney disease stage 3a.          QUERY TEXT:    Good Morning    This patient admitted on 10/21/2023 for intractable nausea and vomiting and   abdominal pain.    The medical record notes she has "history of Gastroparesis".    If possible, in your medical opinion, can you please clarify and  document in   progress notes and discharge summary if you are evaluating and /or treating   any of the following:    The medical record reflects the following:  Risk Factors: DM, AKI on CKD, HTN  Clinical Indicators: Presented with Intractable nausea and vomiting ,   Extensive workup spanning several years for the same symptoms CT abdomen here   not acute. hx of gastroparesis with a recent gastric emptying study on   10/13/2023  Treatment: PPI, CLD, Cont. IVF, GI consulted, Daily labs.    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    Query created by: Leroy Kennedy on 10/24/2023 11:15 AM      Electronically signed by:  Renelda Loma MD 10/24/2023 11:19 AM

## 2023-10-24 NOTE — Care Coordination-Inpatient (Signed)
10/24/23 1512   Readmission Assessment   Number of Days since last admission? 1-7 days  (09/25/23-10/14/23)   Previous Disposition Home with Family   Who is being Interviewed Patient   What was the patient's/caregiver's perception as to why they think they needed to return back to the hospital? Other (Comment)  (recurrent nausea, vomitting, and abdominal pain)   Did you visit your Primary Care Physician after you left the hospital, before you returned this time? No  (Scheduled to be seen on 10/17/23 but patient requested reschedule.)   Why weren't you able to visit your PCP?   (NA)   Did you see a specialist, such as Cardiac, Pulmonary, Orthopedic Physician, etc. after you left the hospital? No   Who advised the patient to return to the hospital? Self-referral   Does the patient report anything that got in the way of taking their medications? No   What reasons did they give? Other (Comment)  (NA)   In our efforts to provide the best possible care to you and others like you, can you think of anything that we could have done to help you after you left the hospital the first time, so that you might not have needed to return so soon? Other (Comment)  (NA)     Suzan Slick [place], [place]  [phone]

## 2023-10-25 LAB — POCT GLUCOSE
POC Glucose: 187 mg/dL — ABNORMAL HIGH (ref 65–117)
POC Glucose: 125 mg/dL — ABNORMAL HIGH (ref 65–117)
POC Glucose: 253 mg/dL — ABNORMAL HIGH (ref 65–117)

## 2023-10-25 LAB — BASIC METABOLIC PANEL
Anion Gap: 5 mmol/L (ref 2–12)
BUN/Creatinine Ratio: 8 — ABNORMAL LOW (ref 12–20)
BUN: 14 mg/dL (ref 6–20)
CO2: 27 mmol/L (ref 21–32)
Calcium: 8.5 mg/dL (ref 8.5–10.1)
Chloride: 106 mmol/L (ref 97–108)
Creatinine: 1.78 mg/dL — ABNORMAL HIGH (ref 0.55–1.02)
Est, Glom Filt Rate: 36 mL/min/{1.73_m2} — ABNORMAL LOW (ref >60)
Glucose: 130 mg/dL — ABNORMAL HIGH (ref 65–100)
Potassium: 3.4 mmol/L — ABNORMAL LOW (ref 3.5–5.1)
Sodium: 138 mmol/L (ref 136–145)

## 2023-10-25 LAB — MAGNESIUM: Magnesium: 1.9 mg/dL (ref 1.6–2.4)

## 2023-10-25 LAB — PHOSPHORUS: Phosphorus: 3.5 mg/dL (ref 2.6–4.7)

## 2023-10-25 MED ORDER — PROCHLORPERAZINE MALEATE 10 MG PO TABS
10 | ORAL_TABLET | Freq: Four times a day (QID) | ORAL | 3 refills | 7.00000 days | Status: DC | PRN
Start: 2023-10-25 — End: 2024-08-28

## 2023-10-25 MED ORDER — POLYETHYLENE GLYCOL 3350 17 G PO PACK
17 | Freq: Every day | ORAL | Status: DC
Start: 2023-10-25 — End: 2023-10-25
  Administered 2023-10-25: 14:00:00 17 g via ORAL

## 2023-10-25 MED ORDER — ENOXAPARIN SODIUM 40 MG/0.4ML IJ SOSY
40 | Freq: Every day | INTRAMUSCULAR | Status: DC
Start: 2023-10-25 — End: 2023-10-25

## 2023-10-25 MED ORDER — CLONIDINE 0.2 MG/24HR TD PTWK
0.2 | TRANSDERMAL | Status: DC
Start: 2023-10-25 — End: 2023-10-25
  Administered 2023-10-25: 14:00:00 1 via TRANSDERMAL

## 2023-10-25 MED ORDER — LINACLOTIDE 145 MCG PO CAPS
145 | Freq: Every day | ORAL | Status: DC
Start: 2023-10-25 — End: 2023-10-25
  Administered 2023-10-25: 14:00:00 290 ug via ORAL

## 2023-10-25 MED ORDER — NITROGLYCERIN 2 % TD OINT
2 | Freq: Once | TRANSDERMAL | Status: AC
Start: 2023-10-25 — End: 2023-10-24
  Administered 2023-10-25: 02:00:00 0.5 [in_us] via TOPICAL

## 2023-10-25 MED ORDER — CLONIDINE 0.2 MG/24HR TD PTWK
0.2 MG/24HR | MEDICATED_PATCH | TRANSDERMAL | 0 refills | Status: DC
Start: 2023-10-25 — End: 2023-11-06

## 2023-10-25 MED ORDER — METOCLOPRAMIDE HCL 10 MG PO TABS
10 | ORAL_TABLET | Freq: Four times a day (QID) | ORAL | 0 refills | 28.00 days | Status: DC | PRN
Start: 2023-10-25 — End: 2023-11-06

## 2023-10-25 MED ORDER — METFORMIN HCL ER 500 MG PO TB24
500 | ORAL_TABLET | Freq: Every day | ORAL | 1 refills | Status: DC
Start: 2023-10-25 — End: 2024-08-28

## 2023-10-25 MED FILL — AMLODIPINE BESYLATE 5 MG PO TABS: 5 MG | ORAL | Qty: 2

## 2023-10-25 MED FILL — PANTOPRAZOLE SODIUM 40 MG IV SOLR: 40 MG | INTRAVENOUS | Qty: 40

## 2023-10-25 MED FILL — LINZESS 145 MCG PO CAPS: 145 MCG | ORAL | Qty: 2

## 2023-10-25 MED FILL — METOCLOPRAMIDE HCL 5 MG/ML IJ SOLN: 5 MG/ML | INTRAMUSCULAR | Qty: 2

## 2023-10-25 MED FILL — ERYTHROCIN LACTOBIONATE 500 MG IV SOLR: 500 MG | INTRAVENOUS | Qty: 125

## 2023-10-25 MED FILL — LORAZEPAM 2 MG/ML IJ SOLN: 2 MG/ML | INTRAMUSCULAR | Qty: 1

## 2023-10-25 MED FILL — HYDRALAZINE HCL 20 MG/ML IJ SOLN: 20 MG/ML | INTRAMUSCULAR | Qty: 1

## 2023-10-25 MED FILL — LANTUS 100 UNIT/ML SC SOLN: 100 UNIT/ML | SUBCUTANEOUS | Qty: 4

## 2023-10-25 MED FILL — NITRO-BID 2 % TD OINT: 2 % | TRANSDERMAL | Qty: 1

## 2023-10-25 MED FILL — POLYETHYLENE GLYCOL 3350 17 G PO PACK: 17 g | ORAL | Qty: 1

## 2023-10-25 MED FILL — METOPROLOL SUCCINATE ER 50 MG PO TB24: 50 MG | ORAL | Qty: 1

## 2023-10-25 MED FILL — INSULIN LISPRO 100 UNIT/ML IJ SOLN: 100 UNIT/ML | INTRAMUSCULAR | Qty: 2

## 2023-10-25 MED FILL — INSULIN LISPRO 100 UNIT/ML IJ SOLN: 100 UNIT/ML | INTRAMUSCULAR | Qty: 4

## 2023-10-25 MED FILL — LABETALOL HCL 5 MG/ML IV SOLN: 5 MG/ML | INTRAVENOUS | Qty: 4

## 2023-10-25 MED FILL — ENOXAPARIN SODIUM 30 MG/0.3ML IJ SOSY: 30 MG/0.3ML | INTRAMUSCULAR | Qty: 0.3

## 2023-10-25 MED FILL — CLONIDINE 0.2 MG/24HR TD PTWK: 0.2 MG/24HR | TRANSDERMAL | Qty: 1

## 2023-10-25 MED FILL — LISINOPRIL 20 MG PO TABS: 20 MG | ORAL | Qty: 2

## 2023-10-25 NOTE — Consults (Signed)
[facility]                    Cardiology Care Note     [x] Initial Encounter     [] Follow-up    Patient Name: Melinda [name] - DOB:21-Feb-1981 - ZOX:[id number]  Primary Cardiologist: former [facility] patient  Consulting Cardiologist: Shelda Altes, MD     Reason for encounter: ischemic eval     HPI:       Jordie Bumann is a 43 y.o. female with PMH significant for HTN, DM, gastroparesis admitted for N/V/abd pain on 10/21/23. GI following patient and concerned if her abd pain in anginal equivalent.     Subjective:      Timmothy Euler Toto-Ambros reports abd pain , intermittent post prandial and random      Assessment and Plan     Intractable nausea vomiting and abdominal pain:    Recurrent.   Had an extensive workup spanning several years for the above symptoms.   - CT abd NAP.   - She has a history of gastroparesis with a recent delayed gastric emptying study on 1/2. Some improvement  - GI requesting ischemic work up to determine if her abd pain is her anginal equivalent.      Hypertensive urgency:   - clonidine patch  - IV hydralazine and labetalol PRN     Sinus Tachycardia:   - monitor     AKI on CKD  -     Hypokalemia    DM    D/W Dr Elmon Kirschner, he will determine testing          ____________________________________________________________    Cardiac testing  09/17/23    ECHO (TTE) COMPLETE (PRN CONTRAST/BUBBLE/STRAIN/3D) 09/22/2023  9:33 PM (Final)    Interpretation Summary    Left Ventricle: Normal left ventricular systolic function. EF by 2D Simpsons Biplane is 59%. Left ventricle size is normal. Findings consistent with moderate concentric hypertrophy. Normal wall motion. Diastolic dysfunction present with normal LV EF.    Tricuspid Valve: Mild regurgitation.    Image quality is adequate.    Signed by: Victorino December, MD on 09/22/2023  9:33 PM    Stress ECHO 07/23/2021 no ischemia     Most recent HS troponins:  No results for input(s): "TROPHS", "CKMB" in the last 72 hours.    ECG:    Encounter Date: 10/21/23   EKG 12 Lead   Result Value    Ventricular Rate 127    Atrial Rate 127    P-R Interval 126    QRS Duration 78    Q-T Interval 348    QTc Calculation (Bazett) 505    P Axis 61    R Axis 264    T Axis 27    Diagnosis      Sinus tachycardia  Right superior axis deviation  Pulmonary disease pattern  Minimal voltage criteria for LVH, may be normal variant ( Cornell product )  Nonspecific ST abnormality  Abnormal ECG  When compared with ECG of 18-Sep-2023 00:33,  ST now depressed in Anterior leads  Nonspecific T wave abnormality no longer evident in Anterolateral leads  Confirmed by Elmon Kirschner MD, MARK ([id number]) on 10/21/2023 3:28:15 PM           Review of Systems:    [x] All other systems reviewed and all negative except as written in HPI    []  Patient unable to provide secondary to condition    Past Medical History:   Diagnosis Date  CKD (chronic kidney disease)     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)     Gastroparesis     Gastroparesis     GERD (gastroesophageal reflux disease)     High cholesterol     Hypertension      Past Surgical History:   Procedure Laterality Date    CAPSULE ENDOSCOPY N/A 01/20/2023    ESOPHAGEAL CAPSULE ENDOSCOPY remove at 1624PM performed by Glyn Ade, MD at [facility] ENDOSCOPY    CHOLECYSTECTOMY, LAPAROSCOPIC N/A 02/03/2023    ROBOTIC LAPAROSCOPIC CHOLECYSTECTOMY with Indocyanine green performed by Verdis Frederickson, MD at [facility] MAIN OR    COLONOSCOPY N/A 01/19/2023    COLONOSCOPY DIAGNOSTIC performed by Glyn Ade, MD at [facility] ENDOSCOPY    INVASIVE VASCULAR N/A 10/03/2023    Angiography visceral SMA performed by Natasha Mead, MD at [facility] CARDIAC CATH LAB    INVASIVE VASCULAR N/A 10/03/2023    Ultrasound guided vascular access performed by Natasha Mead, MD at [facility] CARDIAC CATH LAB    INVASIVE VASCULAR N/A 10/03/2023    Insert stent peripheral artery performed by Natasha Mead, MD at [facility] CARDIAC CATH LAB    IR NONTUNNELED VASCULAR CATHETER > 5 YEARS  10/10/2023     IR NONTUNNELED VASCULAR CATHETER > 5 YEARS 10/10/2023 [facility] CARDIAC CATH/EP/IR LAB    IR NONTUNNELED VASCULAR CATHETER > 5 YEARS  10/10/2023    IR NONTUNNELED VASCULAR CATHETER > 5 YEARS 10/10/2023 [facility] CARDIAC CATH/EP/IR LAB    OTHER SURGICAL HISTORY Left     Rentia attachment    TUBAL LIGATION Bilateral     UPPER GASTROINTESTINAL ENDOSCOPY N/A 01/17/2023    ESOPHAGOGASTRODUODENOSCOPY performed by Glyn Ade, MD at [facility] ENDOSCOPY    UPPER GASTROINTESTINAL ENDOSCOPY N/A 01/17/2023    ESOPHAGOGASTRODUODENOSCOPY BIOPSY performed by Glyn Ade, MD at [facility] ENDOSCOPY    UPPER GASTROINTESTINAL ENDOSCOPY N/A 01/18/2023    ESOPHAGOGASTRODUODENOSCOPY performed by Glyn Ade, MD at [facility] ENDOSCOPY    UPPER GASTROINTESTINAL ENDOSCOPY N/A 05/13/2023    ESOPHAGOGASTRODUODENOSCOPY performed by Leta Speller, MD at [facility] ENDOSCOPY    UPPER GASTROINTESTINAL ENDOSCOPY N/A 05/13/2023    ESOPHAGOGASTRODUODENOSCOPY BIOPSY performed by Leta Speller, MD at [facility] ENDOSCOPY    UPPER GASTROINTESTINAL ENDOSCOPY N/A 09/21/2023    ESOPHAGOGASTRODUODENOSCOPY performed by Glyn Ade, MD at [facility] ENDOSCOPY    UPPER GASTROINTESTINAL ENDOSCOPY N/A 09/21/2023    ESOPHAGOGASTRODUODENOSCOPY BIOPSY performed by Glyn Ade, MD at [facility] ENDOSCOPY    [place] ABSCESS DRAINAGE PERITONEAL/RETROPERITONEAL PERC  02/11/2023    [place] ABSCESS DRAINAGE PERITONEAL 02/11/2023 [facility] [place]     Social Hx:  reports that she has never smoked. She has never used smokeless tobacco. She reports that she does not drink alcohol and does not use drugs.  Family Hx: family history is not on file.  No Known Allergies       OBJECTIVE:  Wt Readings from Last 3 Encounters:   10/21/23 55.2 kg (121 lb 11.1 oz)   10/12/23 60.4 kg (133 lb 3.2 oz)   10/03/23 56.7 kg (125 lb)     No intake/output data recorded.  No intake/output data recorded.        Physical Exam:    Vitals:   Vitals:    10/25/23 0545 10/25/23 0700 10/25/23 0756 10/25/23 0757   BP: (!) 178/102  (!) 164/102 (!) 178/106   Pulse:  (!) 102 92 96 99   Resp:       Temp:   98.4 F (36.9 C)  TempSrc:    Oral   SpO2:   96% 97%   Weight:         Telemetry: normal sinus rhythm/sinus tachyardia    Gen: Well-developed, well-nourished, in no acute distress  Neck: Supple, No JVD, No Carotid Bruit  Resp: No accessory muscle use, Clear breath sounds, No rales or rhonchi  Card: Regular Rate,Rhythm, Normal S1, S2, No murmurs, rubs or gallop. No thrills.   Abd:   Soft, tender, non-distended, BS+   MSK: No cyanosis  Skin: No rashes    Neuro: Moving all four extremities, follows commands appropriately  Psych: Fair insight, oriented to person, place, alert, Nml Affect  LE: No edema    Data Review:     Radiology:   XR Results (most recent):Xray Result (most recent):  XR CHEST PORTABLE 10/10/2023    Narrative  EXAM: Portable CXR. 1708 hours.    COMPARISON: 1459 hours.    INDICATION: cvc placement verification    FINDINGS:  Right IJ CVL tip has been retracted into the upper right atrium. The lungs  appear clear. Heart is normal in size. There is no pulmonary edema. There is no  evident pneumothorax or pleural effusion.    Impression  Right IJ CVL tip in the upper right atrium.      Electronically signed by Carrolyn Leigh       CT Result (most recent):  CT ABDOMEN PELVIS WO CONTRAST 10/21/2023    Narrative  EXAM: CT ABDOMEN PELVIS WO CONTRAST    INDICATION: abd pain    COMPARISON: 10/06/2023    IV CONTRAST: None.    ORAL CONTRAST: None    TECHNIQUE:  Thin axial images were obtained through the abdomen and pelvis. Coronal and  sagittal reformats were generated. CT dose reduction was achieved through use of  a standardized protocol tailored for this examination and automatic exposure  control for dose modulation.    The absence of intravenous contrast material reduces the sensitivity for  evaluation of the vasculature and solid organs.    FINDINGS:  LOWER THORAX: No significant abnormality in the incidentally imaged lower chest.  LIVER: No mass.  BILIARY TREE:  Status post cholecystectomy. CBD is not dilated.  SPLEEN: within normal limits.  PANCREAS: No focal abnormality.  ADRENALS: Unremarkable.  KIDNEYS/URETERS: No calculus or hydronephrosis.  STOMACH: Small hiatal hernia.  SMALL BOWEL: No dilatation or wall thickening.  COLON: No dilatation or wall thickening.  APPENDIX: Unremarkable.  PERITONEUM: No ascites or pneumoperitoneum.  RETROPERITONEUM: No lymphadenopathy or aortic aneurysm.  REPRODUCTIVE ORGANS: Unremarkable.  URINARY BLADDER: No mass or calculus.  BONES: No destructive bone lesion.  ABDOMINAL WALL: No mass or hernia.  ADDITIONAL COMMENTS: N/A    Impression  No renal or ureteral stone or evidence of hydronephrosis. No acute abnormality.    Electronically signed by Carola Rhine      Lab Results   Component Value Date    WBC 8.7 10/23/2023    HGB 8.9 (L) 10/23/2023    HCT 26.9 (L) 10/23/2023    MCV 87.6 10/23/2023    PLT 488 (H) 10/23/2023       No results for input(s): "CHOL", "HDLC", "LDLC", "HBA1C" in the last 72 hours.    Invalid input(s): "TGL"    Lab Results   Component Value Date/Time    NA 138 10/25/2023 08:17 AM    K 3.4 10/25/2023 08:17 AM    CL 106 10/25/2023 08:17 AM    CO2 27 10/25/2023 08:17 AM  BUN 14 10/25/2023 08:17 AM    CREATININE 1.78 10/25/2023 08:17 AM    GLUCOSE 130 10/25/2023 08:17 AM    CALCIUM 8.5 10/25/2023 08:17 AM    LABGLOM 36 10/25/2023 08:17 AM    LABGLOM 58 02/03/2023 02:36 AM    LABGLOM >60 10/22/2021 10:00 AM      No results found for: "BNP"        Current meds:    Current Facility-Administered Medications:     cloNIDine (CATAPRES) 0.2 MG/24HR 1 patch, 1 patch, TransDERmal, Weekly, Aktig, Ziya, MD, 1 patch at 10/25/23 0926    linaclotide (LINZESS) capsule 290 mcg, 290 mcg, Oral, QAM AC, Aktig, Ziya, MD, 290 mcg at 10/25/23 0926    polyethylene glycol (GLYCOLAX) packet 17 g, 17 g, Oral, Daily, Aktig, Ziya, MD, 17 g at 10/25/23 0832    insulin glargine (LANTUS) injection vial 4 Units, 4 Units, SubCUTAneous, Nightly, Aktig,  Ziya, MD, 4 Units at 10/24/23 2033    glucose chewable tablet 16 g, 4 tablet, Oral, PRN, Aktig, Ziya, MD    dextrose bolus 10% 125 mL, 125 mL, IntraVENous, PRN **OR** dextrose bolus 10% 250 mL, 250 mL, IntraVENous, PRN, Aktig, Ziya, MD    glucagon injection 1 mg, 1 mg, SubCUTAneous, PRN, Aktig, Ziya, MD    dextrose 10 % infusion, , IntraVENous, Continuous PRN, Aktig, Ziya, MD    insulin lispro (HUMALOG,ADMELOG) injection vial 0-8 Units, 0-8 Units, SubCUTAneous, 4 times per day, Renelda Loma, MD, 2 Units at 10/25/23 0029    metoclopramide (REGLAN) injection 5 mg, 5 mg, IntraVENous, Q6H, Aktig, Ziya, MD, 5 mg at 10/25/23 0548    enoxaparin Sodium (LOVENOX) injection 30 mg, 30 mg, SubCUTAneous, Daily, Aktig, Ziya, MD, 30 mg at 10/25/23 0829    erythromycin (ERYTHROCIN) 125 mg in sodium chloride 0.9 % 100 mL IVPB, 125 mg, IntraVENous, Q6H, Aktig, Ziya, MD, Stopped at 10/25/23 0548    LORazepam (ATIVAN) injection 0.5 mg, 0.5 mg, IntraVENous, Q6H, Aktig, Ziya, MD, 0.5 mg at 10/25/23 0829    hydrALAZINE (APRESOLINE) injection 20 mg, 20 mg, IntraVENous, Q6H PRN, Jodelle Gross, Ziya, MD, 20 mg at 10/25/23 0829    labetalol (NORMODYNE;TRANDATE) injection 10 mg, 10 mg, IntraVENous, Q6H PRN, Jodelle Gross, Ziya, MD, 10 mg at 10/25/23 0029    [Held by provider] metoclopramide (REGLAN) tablet 5 mg, 5 mg, Oral, 4x Daily AC & HS, Aktig, Ziya, MD, 5 mg at 10/23/23 0613    ondansetron (ZOFRAN-ODT) disintegrating tablet 4 mg, 4 mg, Oral, Q8H PRN, Aktig, Ziya, MD    prochlorperazine (COMPAZINE) injection 10 mg, 10 mg, IntraVENous, Q6H PRN, Edwards, Sabrina C, APRN - NP, 10 mg at 10/23/23 1159    sodium chloride flush 0.9 % injection 5-40 mL, 5-40 mL, IntraVENous, 2 times per day, Pryor Ochoa, MD, 10 mL at 10/24/23 2034    sodium chloride flush 0.9 % injection 5-40 mL, 5-40 mL, IntraVENous, PRN, Pryor Ochoa, MD    0.9 % sodium chloride infusion, , IntraVENous, PRN, Pryor Ochoa, MD, Last Rate: 5 mL/hr at 10/24/23 1542, New Bag at  10/24/23 1542    potassium chloride (KLOR-CON) extended release tablet 40 mEq, 40 mEq, Oral, PRN **OR** potassium bicarb-citric acid (EFFER-K) effervescent tablet 40 mEq, 40 mEq, Oral, PRN **OR** potassium chloride 10 mEq/100 mL IVPB (Peripheral Line), 10 mEq, IntraVENous, PRN, Pryor Ochoa, MD    magnesium sulfate 2000 mg in 50 mL IVPB premix, 2,000 mg, IntraVENous, PRN, Pryor Ochoa, MD    acetaminophen (TYLENOL) tablet  650 mg, 650 mg, Oral, Q6H PRN, 650 mg at 10/24/23 0012 **OR** acetaminophen (TYLENOL) suppository 650 mg, 650 mg, Rectal, Q6H PRN, Pryor Ochoa, MD    pantoprazole (PROTONIX) 40 mg in sodium chloride (PF) 0.9 % 10 mL injection, 40 mg, IntraVENous, Daily, Pryor Ochoa, MD, 40 mg at 10/25/23 0830    [Held by provider] amLODIPine (NORVASC) tablet 10 mg, 10 mg, Oral, Daily, Pryor Ochoa, MD, 10 mg at 10/23/23 0825    [Held by provider] aspirin chewable tablet 81 mg, 81 mg, Oral, Daily, Pryor Ochoa, MD, 81 mg at 10/23/23 0825    [Held by provider] busPIRone (BUSPAR) tablet 15 mg, 15 mg, Oral, TID, Pryor Ochoa, MD, 15 mg at 10/23/23 5284    [Held by provider] clopidogrel (PLAVIX) tablet 75 mg, 75 mg, Oral, Daily, Pryor Ochoa, MD, 75 mg at 10/23/23 0825    dicyclomine (BENTYL) tablet 20 mg, 20 mg, Oral, Q6H PRN, Pryor Ochoa, MD, 20 mg at 10/23/23 2125    [Held by provider] DULoxetine (CYMBALTA) extended release capsule 60 mg, 60 mg, Oral, Daily, Pryor Ochoa, MD, 60 mg at 10/23/23 0824    [Held by provider] lisinopril (PRINIVIL;ZESTRIL) tablet 40 mg, 40 mg, Oral, Daily, Pryor Ochoa, MD, 40 mg at 10/23/23 1324    [Held by provider] metoprolol succinate (TOPROL XL) extended release tablet 50 mg, 50 mg, Oral, Daily, Pryor Ochoa, MD, 50 mg at 10/23/23 0825    Selena Lesser, APRN - NP    [facility] Cardiology  Call center: (P) [phone]  (F) [phone]      CC:Orlean Bradford, APRN - NP

## 2023-10-25 NOTE — Progress Notes (Signed)
Enoxaparin 30 mg SQ daily adjusted per protocol to Enoxaparin 40 mg SQ daily (CrCl 32 mL/min; wt 55.2 kg)    Thank you,  Icy Fuhrmann L. Butch Penny D, [place]  [phone]

## 2023-10-25 NOTE — Progress Notes (Signed)
New patient PCP appointment set for Monday Febuary 3rd 2025 at 1:00 PM with Doctor Clemon Chambers at their [place] office. Office asks for patient to arrive 15 mins early, bring picture ID/Insurance cards and list of current medications.

## 2023-10-25 NOTE — Discharge Summary (Signed)
Hospitalist Discharge Summary     Patient ID:  Melinda [name]  [id number]  43 y.o.  1981-01-19    Admit date: 10/21/2023    Discharge date and time: 10/25/2023    Admission Diagnoses: Epigastric pain [R10.13]  Intractable vomiting [R11.10]  Uncontrolled hypertension [I10]  Intractable nausea and vomiting [R11.2]    Discharge Diagnoses:    Principal Problem:    Intractable vomiting  Resolved Problems:    * No resolved hospital problems. [facility] Course:   Intractable nausea vomiting and abdominal pain: POA. Recurrent. She has had an extensive workup spanning several years for the above symptoms. CT abd NAP. She has a history of gastroparesis with a recent delayed gastric emptying study on 1/2. Improved with meds  - Cont home meds including reglan  - PCP and GI follow up  - counseled on compliance    Abdominal pain: POA. Now resolved. Was probably d/t above  - cards follow up for outpatient stress test  - appreciate GI  - Hold all PO meds  - Reglan, zofran, compazine  erythromycin, ativan IV  - PPI  - CLD ADAT  - Cont IVF  - Watch lytes     Hypertensive urgency: POA. Suspect due to the above issues and also uncertain if she is compliant with her home medications. improved  - clonidine patch for 7 days  - cont home meds  - counseled on complaince     Tachycardia: POA. Likely from sympathetic stim from the above  - resolved     AKI on CKD  Hypokalemia  -Suspect IVVD from vomiting  - resolved     SIRS  -Triggered due to leukocytosis, tachycardia but suspect this is all due to IVVD and vomiting  - resolved with IVF  -UA with some yeast likely contam or colonization     Type 2 diabetes  -Recent A1c 7.5  -Continue home insulin  - PCP follow up     History of SMA stenosis  -Recent SMA stenting on 12/23  -Continue aspirin and Plavix     Substance use disorder  - not on methadone or other opioids  - UDS appropriately positive (received morphine while inpatient)     Qtc prolongation: POA.  - try to avoid Qtc  prolonging meds     POLST: Patient not ready    Imaging  CT ABDOMEN PELVIS WO CONTRAST Additional Contrast? None    Result Date: 10/21/2023  No renal or ureteral stone or evidence of hydronephrosis. No acute abnormality. Electronically signed by Carola Rhine       PCP: Orlean Bradford, APRN - NP     Consults: cards, GI    Condition of patient at discharge: Improved    Discharge Exam:    Physical Exam:    Gen:  in no acute distress  HEENT:  Pink conjunctivae, eomi, hearing intact to voice, moist mucous membranes  Resp:  No accessory muscle use, clear breath sounds without wheezes rales or rhonchi  Card:  No murmurs, normal S1, S2 without thrills, bruits or peripheral edema  Abd:  Epigastric TTP. Otherwise no tenderness  Musc:  No cyanosis or clubbing  Skin:  No rashes or ulcers, skin turgor is good  Neuro:  follows commands appropriately  Psych:  Good insight, oriented to person, place and time, alert          Disposition: home    Patient Instructions:   Current Discharge Medication List  START taking these medications    Details   cloNIDine (CATAPRES) 0.2 MG/24HR PTWK Place 1 patch onto the skin once a week for 7 days  Qty: 1 patch, Refills: 0      prochlorperazine (COMPAZINE) 10 MG tablet Take 1 tablet by mouth every 6 hours as needed (nausea or vomiting)  Qty: 120 tablet, Refills: 3      metFORMIN (GLUCOPHAGE-XR) 500 MG extended release tablet Take 2 tablets by mouth daily (with breakfast)  Qty: 90 tablet, Refills: 1           CONTINUE these medications which have CHANGED    Details   metoclopramide (REGLAN) 10 MG tablet Take 1 tablet by mouth 4 times daily as needed (nausea, vomiting)  Qty: 90 tablet, Refills: 0           CONTINUE these medications which have NOT CHANGED    Details   busPIRone (BUSPAR) 15 MG tablet Take 15 mg by mouth 3 times daily  Qty: 30 tablet, Refills: 0      DULoxetine (CYMBALTA) 60 MG extended release capsule Take 1 capsule by mouth daily  Qty: 30 capsule, Refills: 3       clopidogrel (PLAVIX) 75 MG tablet Take 1 tablet by mouth daily  Qty: 30 tablet, Refills: 3      aspirin 81 MG chewable tablet Take 1 tablet by mouth daily  Qty: 30 tablet, Refills: 3      linaclotide (LINZESS) 290 MCG CAPS capsule Take 1 capsule by mouth every morning (before breakfast)  Qty: 30 capsule, Refills: 0      amitriptyline (ELAVIL) 10 MG tablet Take 1 tablet by mouth nightly  Qty: 90 tablet, Refills: 0      polyethylene glycol (GLYCOLAX) 17 GM/SCOOP powder Take 17 g by mouth 2 times daily  Qty: 1530 g, Refills: 1      dicyclomine (BENTYL) 20 MG tablet Take 1 tablet by mouth every 6 hours as needed (abdominal cramping)  Qty: 90 tablet, Refills: 0      ondansetron (ZOFRAN-ODT) 4 MG disintegrating tablet Take 1 tablet by mouth every 8 hours as needed for Nausea or Vomiting  Qty: 30 tablet, Refills: 0      pantoprazole (PROTONIX) 40 MG tablet Take 1 tablet by mouth 2 times daily (before meals)  Qty: 60 tablet, Refills: 1      lisinopril (PRINIVIL;ZESTRIL) 40 MG tablet Take 1 tablet by mouth daily  Qty: 30 tablet, Refills: 2      metoprolol succinate (TOPROL XL) 50 MG extended release tablet Take 1 tablet by mouth daily  Qty: 30 tablet, Refills: 2      amLODIPine (NORVASC) 10 MG tablet Take 1 tablet by mouth daily  Qty: 30 tablet, Refills: 2           Activity: activity as tolerated  Diet: regular diet  Wound Care: none needed    Follow-up with [place], Funmilola, APRN - NP in 7 days. cards  Follow-up tests/labs CBC, CMP    Approximate time spent in patient care on day of discharge: 45 minutes    Signed:  Renelda Loma, MD  10/25/2023  10:56 AM

## 2023-10-25 NOTE — Progress Notes (Signed)
Charlcie Cradle, ACNP-BC  [phone] office  [phone] voicemail     Gastroenterology Progress Note    October 25, 2023  Admit Date: 10/21/2023         Narrative Assessment and Plan   Persistent abdominal pain with nausea/vomiting in the setting of uncontrolled diabetes and recent diagnosis of gastroparesis based on gastric emptying study (Study was done inpatient, patient was on methadone and it was not a 4 hour study, so interpretation with caution).  CT abd/pelvis 1/10 without any acute abnormality     Impression:  Intractable nausea/vomiting  Hypertensive emergency  Abdominal pain  Diabetic gastroparesis  Hyperglycemia     10/24/23 - Denies pain. No N/V and tolerating clears.     10/25/23 - No pain or N/V. Tolerating PO. Appreciate Cards evaluation.      Rec:  Diet as tolerated, recommend small, frequent meals  Continue bowel regimen of Linzess daily and Miralax 1-2 doses daily  OK with D/C home from GI standpoint and will arrange outpatient follow up to continue constipation management and consider outpatient VCE    Subjective:   Denies pain or nausea. Tolerating PO. Ambulating in the hallway.     ROS:  The previous review of systems on initial consultation / H&P is noted and reviewed.  Specific changes noted above in HPI.    Current Medications:     Current Facility-Administered Medications   Medication Dose Route Frequency    cloNIDine (CATAPRES) 0.2 MG/24HR 1 patch  1 patch TransDERmal Weekly    linaclotide (LINZESS) capsule 290 mcg  290 mcg Oral QAM AC    polyethylene glycol (GLYCOLAX) packet 17 g  17 g Oral Daily    [START ON 10/26/2023] enoxaparin (LOVENOX) injection 40 mg  40 mg SubCUTAneous Daily    insulin glargine (LANTUS) injection vial 4 Units  4 Units SubCUTAneous Nightly    glucose chewable tablet 16 g  4 tablet Oral PRN    dextrose bolus 10% 125 mL  125 mL IntraVENous PRN    Or    dextrose bolus 10% 250 mL  250 mL IntraVENous PRN    glucagon injection 1 mg  1 mg  SubCUTAneous PRN    dextrose 10 % infusion   IntraVENous Continuous PRN    insulin lispro (HUMALOG,ADMELOG) injection vial 0-8 Units  0-8 Units SubCUTAneous 4 times per day    metoclopramide (REGLAN) injection 5 mg  5 mg IntraVENous Q6H    erythromycin (ERYTHROCIN) 125 mg in sodium chloride 0.9 % 100 mL IVPB  125 mg IntraVENous Q6H    LORazepam (ATIVAN) injection 0.5 mg  0.5 mg IntraVENous Q6H    hydrALAZINE (APRESOLINE) injection 20 mg  20 mg IntraVENous Q6H PRN    labetalol (NORMODYNE;TRANDATE) injection 10 mg  10 mg IntraVENous Q6H PRN    [Held by provider] metoclopramide (REGLAN) tablet 5 mg  5 mg Oral 4x Daily AC & HS    ondansetron (ZOFRAN-ODT) disintegrating tablet 4 mg  4 mg Oral Q8H PRN    prochlorperazine (COMPAZINE) injection 10 mg  10 mg IntraVENous Q6H PRN    sodium chloride flush 0.9 % injection 5-40 mL  5-40 mL IntraVENous 2 times per day    sodium chloride flush 0.9 % injection 5-40 mL  5-40 mL IntraVENous PRN    0.9 % sodium chloride infusion   IntraVENous PRN    potassium chloride (KLOR-CON) extended release tablet 40 mEq  40 mEq Oral PRN    Or  potassium bicarb-citric acid (EFFER-K) effervescent tablet 40 mEq  40 mEq Oral PRN    Or    potassium chloride 10 mEq/100 mL IVPB (Peripheral Line)  10 mEq IntraVENous PRN    magnesium sulfate 2000 mg in 50 mL IVPB premix  2,000 mg IntraVENous PRN    acetaminophen (TYLENOL) tablet 650 mg  650 mg Oral Q6H PRN    Or    acetaminophen (TYLENOL) suppository 650 mg  650 mg Rectal Q6H PRN    pantoprazole (PROTONIX) 40 mg in sodium chloride (PF) 0.9 % 10 mL injection  40 mg IntraVENous Daily    amLODIPine (NORVASC) tablet 10 mg  10 mg Oral Daily    [Held by provider] aspirin chewable tablet 81 mg  81 mg Oral Daily    [Held by provider] busPIRone (BUSPAR) tablet 15 mg  15 mg Oral TID    [Held by provider] clopidogrel (PLAVIX) tablet 75 mg  75 mg Oral Daily    dicyclomine (BENTYL) tablet 20 mg  20 mg Oral Q6H PRN    [Held by provider] DULoxetine (CYMBALTA) extended  release capsule 60 mg  60 mg Oral Daily    lisinopril (PRINIVIL;ZESTRIL) tablet 40 mg  40 mg Oral Daily    metoprolol succinate (TOPROL XL) extended release tablet 50 mg  50 mg Oral Daily       Objective:     VITALS:   Last 24hrs VS reviewed since prior progress note. Most recent are:  Vitals:    10/25/23 1112   BP: (!) 169/101   Pulse: (!) 111   Resp:    Temp:    SpO2:      Temp (24hrs), Avg:99.1 F (37.3 C), Min:98.4 F (36.9 C), Max:99.7 F (37.6 C)    No intake or output data in the 24 hours ending 10/25/23 1248    EXAM:  General:  Comfortable, no distress    HEENT:  Atraumatic skull, pupils equal  Lungs:   No abnormal audible breath sounds.  Speaking in complete sentences  Heart:   No abnormal audible heart sounds.  Well perfused  Abdomen:  Non-distended, non-tender.  No mass, guarding or rebound  Musc:   No skeletal defects or deformities  Neurologic:   Cranial nerves grossly intact, moves all 4 extremities  Psych:    Good insight. Not anxious nor agitated  Derm:   No rash, jaundice, nor palpable dermatologic mass    Lab Data Reviewed:   Recent Labs     10/23/23  0450   WBC 8.7   HGB 8.9*   HCT 26.9*   PLT 488*     Recent Labs     10/23/23  0450 10/25/23  0817   NA 138 138   K 3.9 3.4*   CL 106 106   CO2 26 27   BUN 23* 14   MG 2.3 1.9   PHOS 5.0* 3.5     No results found for: "GLUCPOC"  No results for input(s): "PH", "PCO2", "PO2", "HCO3", "FIO2" in the last 72 hours.  No results for input(s): "INR" in the last 72 hours.        Assessment:   (See above)  Principal Problem:    Intractable vomiting  Resolved Problems:    * No resolved hospital problems. *      Plan:   (See above)      Signed By: Moise Boring, APRN - NP     10/25/2023  12:48 PM

## 2023-10-31 DIAGNOSIS — R112 Nausea with vomiting, unspecified: Secondary | ICD-10-CM

## 2023-10-31 DIAGNOSIS — E1143 Type 2 diabetes mellitus with diabetic autonomic (poly)neuropathy: Principal | ICD-10-CM

## 2023-10-31 NOTE — ED Provider Notes (Signed)
[facility] EMERGENCY DEPARTMENT  EMERGENCY DEPARTMENT ENCOUNTER      Pt Name: Melinda [name]  MRN: [id number]  Birthdate [birth year]  Date of evaluation: 10/31/2023  Provider: Valere Dross, PA    CHIEF COMPLAINT       Chief Complaint   Patient presents with    Abdominal Pain    Nausea    Vomiting         HISTORY OF PRESENT ILLNESS    Patient is a 43 yo female with history of DM, gastroparesis, CKD, HTN, HLD who presents to ED due to nausea and vomiting that started today. Reports she was doing well since her recent admission until today. She has had multiple episodes of nausea and vomiting and is unable to tolerate PO.  She states she took ibuprofen 4 hours ago.  She has not taken any nausea medications for symptom relief.  She denies any fever, chills, chest pain, shortness of breath, diarrhea, constipation, urinary symptoms.            Review of External Medical Records:     Nursing Notes were reviewed.    REVIEW OF SYSTEMS       Review of Systems   All other systems reviewed and are negative.      Except as noted above the remainder of the review of systems was reviewed and negative.       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)     Gastroparesis     Gastroparesis     GERD (gastroesophageal reflux disease)     High cholesterol     Hypertension          SURGICAL HISTORY       Past Surgical History:   Procedure Laterality Date    CAPSULE ENDOSCOPY N/A 01/20/2023    ESOPHAGEAL CAPSULE ENDOSCOPY remove at 1624PM performed by Glyn Ade, MD at [facility] ENDOSCOPY    CHOLECYSTECTOMY, LAPAROSCOPIC N/A 02/03/2023    ROBOTIC LAPAROSCOPIC CHOLECYSTECTOMY with Indocyanine green performed by Verdis Frederickson, MD at [facility] MAIN OR    COLONOSCOPY N/A 01/19/2023    COLONOSCOPY DIAGNOSTIC performed by Glyn Ade, MD at [facility] ENDOSCOPY    INVASIVE VASCULAR N/A 10/03/2023    Angiography visceral SMA  performed by Natasha Mead, MD at [facility] CARDIAC CATH LAB    INVASIVE VASCULAR N/A 10/03/2023    Ultrasound guided vascular access performed by Natasha Mead, MD at [facility] CARDIAC CATH LAB    INVASIVE VASCULAR N/A 10/03/2023    Insert stent peripheral artery performed by Natasha Mead, MD at [facility] CARDIAC CATH LAB    IR NONTUNNELED VASCULAR CATHETER > 5 YEARS  10/10/2023    IR NONTUNNELED VASCULAR CATHETER > 5 YEARS 10/10/2023 [facility] CARDIAC CATH/EP/IR LAB    IR NONTUNNELED VASCULAR CATHETER > 5 YEARS  10/10/2023    IR NONTUNNELED VASCULAR CATHETER > 5 YEARS 10/10/2023 [facility] CARDIAC CATH/EP/IR LAB    OTHER SURGICAL HISTORY Left     Rentia attachment    TUBAL LIGATION Bilateral     UPPER GASTROINTESTINAL ENDOSCOPY N/A 01/17/2023    ESOPHAGOGASTRODUODENOSCOPY performed by Glyn Ade, MD at [facility] ENDOSCOPY    UPPER GASTROINTESTINAL ENDOSCOPY N/A 01/17/2023    ESOPHAGOGASTRODUODENOSCOPY BIOPSY performed by Glyn Ade, MD at [facility] ENDOSCOPY    UPPER GASTROINTESTINAL ENDOSCOPY N/A 01/18/2023  ESOPHAGOGASTRODUODENOSCOPY performed by Glyn Ade, MD at [facility] ENDOSCOPY    UPPER GASTROINTESTINAL ENDOSCOPY N/A 05/13/2023    ESOPHAGOGASTRODUODENOSCOPY performed by Leta Speller, MD at [facility] ENDOSCOPY    UPPER GASTROINTESTINAL ENDOSCOPY N/A 05/13/2023    ESOPHAGOGASTRODUODENOSCOPY BIOPSY performed by Leta Speller, MD at [facility] ENDOSCOPY    UPPER GASTROINTESTINAL ENDOSCOPY N/A 09/21/2023    ESOPHAGOGASTRODUODENOSCOPY performed by Glyn Ade, MD at [facility] ENDOSCOPY    UPPER GASTROINTESTINAL ENDOSCOPY N/A 09/21/2023    ESOPHAGOGASTRODUODENOSCOPY BIOPSY performed by Glyn Ade, MD at [facility] ENDOSCOPY    [place] ABSCESS DRAINAGE PERITONEAL/RETROPERITONEAL PERC  02/11/2023    [place] ABSCESS DRAINAGE PERITONEAL 02/11/2023 [facility] [place]         CURRENT MEDICATIONS       Previous Medications    AMITRIPTYLINE (ELAVIL) 10 MG TABLET    Take 1 tablet by mouth nightly    AMLODIPINE (NORVASC) 10 MG TABLET    Take 1 tablet by mouth daily    ASPIRIN 81 MG CHEWABLE  TABLET    Take 1 tablet by mouth daily    BUSPIRONE (BUSPAR) 15 MG TABLET    Take 15 mg by mouth 3 times daily    CLONIDINE (CATAPRES) 0.2 MG/24HR PTWK    Place 1 patch onto the skin once a week for 7 days    CLOPIDOGREL (PLAVIX) 75 MG TABLET    Take 1 tablet by mouth daily    DICYCLOMINE (BENTYL) 20 MG TABLET    Take 1 tablet by mouth every 6 hours as needed (abdominal cramping)    DULOXETINE (CYMBALTA) 60 MG EXTENDED RELEASE CAPSULE    Take 1 capsule by mouth daily    LINACLOTIDE (LINZESS) 290 MCG CAPS CAPSULE    Take 1 capsule by mouth every morning (before breakfast)    LISINOPRIL (PRINIVIL;ZESTRIL) 40 MG TABLET    Take 1 tablet by mouth daily    METFORMIN (GLUCOPHAGE-XR) 500 MG EXTENDED RELEASE TABLET    Take 2 tablets by mouth daily (with breakfast)    METOCLOPRAMIDE (REGLAN) 10 MG TABLET    Take 1 tablet by mouth 4 times daily as needed (nausea, vomiting)    METOPROLOL SUCCINATE (TOPROL XL) 50 MG EXTENDED RELEASE TABLET    Take 1 tablet by mouth daily    ONDANSETRON (ZOFRAN-ODT) 4 MG DISINTEGRATING TABLET    Take 1 tablet by mouth every 8 hours as needed for Nausea or Vomiting    PANTOPRAZOLE (PROTONIX) 40 MG TABLET    Take 1 tablet by mouth 2 times daily (before meals)    POLYETHYLENE GLYCOL (GLYCOLAX) 17 GM/SCOOP POWDER    Take 17 g by mouth 2 times daily    PROCHLORPERAZINE (COMPAZINE) 10 MG TABLET    Take 1 tablet by mouth every 6 hours as needed (nausea or vomiting)       ALLERGIES     Patient has no known allergies.    FAMILY HISTORY     No family history on file.       SOCIAL HISTORY       Social History     Socioeconomic History    Marital status: Married   Tobacco Use    Smoking status: Never    Smokeless tobacco: Never   Vaping Use    Vaping status: Never Used   Substance and Sexual Activity    Alcohol use: Never    Drug use: Never    Sexual activity: Defer     Social Determinants of Health  Food Insecurity: No Food Insecurity (09/25/2023)    Hunger Vital Sign     Worried About Running Out of  Food in the Last Year: Never true     Ran Out of Food in the Last Year: Never true   Transportation Needs: Unmet Transportation Needs (09/25/2023)    PRAPARE - Therapist, art (Medical): Yes     Lack of Transportation (Non-Medical): No   Housing Stability: Low Risk  (09/25/2023)    Housing Stability Vital Sign     Unable to Pay for Housing in the Last Year: No     Number of Times Moved in the Last Year: 0     Homeless in the Last Year: No           PHYSICAL EXAM         ED TRIAGE VITALS  BP: (!) 194/105, Temp: 100 F (37.8 C), Pulse: (!) 118, Respirations: 24, SpO2: 93 %    Body mass index is 23.42 kg/m.    Physical Exam  Vitals and nursing note reviewed.   Constitutional:       General: She is not in acute distress.     Appearance: Normal appearance. She is ill-appearing.   HENT:      Head: Normocephalic and atraumatic.      Nose: Nose normal.      Mouth/Throat:      Mouth: Mucous membranes are moist.   Eyes:      Extraocular Movements: Extraocular movements intact.      Conjunctiva/sclera: Conjunctivae normal.   Cardiovascular:      Rate and Rhythm: Normal rate and regular rhythm.      Heart sounds: Normal heart sounds.   Pulmonary:      Effort: Pulmonary effort is normal. No respiratory distress.      Breath sounds: Normal breath sounds.   Abdominal:      General: Bowel sounds are normal.      Palpations: Abdomen is soft.      Tenderness: There is abdominal tenderness in the epigastric area. There is no guarding or rebound.   Musculoskeletal:         General: Normal range of motion.      Cervical back: Normal range of motion.   Skin:     General: Skin is warm.   Neurological:      General: No focal deficit present.      Mental Status: She is alert and oriented to person, place, and time. Mental status is at baseline.   Psychiatric:         Mood and Affect: Mood normal.         Behavior: Behavior normal.         Thought Content: Thought content normal.         DIAGNOSTIC RESULTS      EKG: All EKG's are interpreted by the Emergency Department Physician who either signs or Co-signs this chart in the absence of a cardiologist.        RADIOLOGY:   Non-plain film images such as CT, Ultrasound and MRI are read by the radiologist. Plain radiographic images are visualized and preliminarily interpreted by the emergency physician with the below findings:        Interpretation per the Radiologist below, if available at the time of this note:    No orders to display        LABS:  Labs Reviewed   CBC WITH AUTO DIFFERENTIAL -  Abnormal; Notable for the following components:       Result Value    WBC 11.4 (*)     RBC 3.56 (*)     Hemoglobin 10.2 (*)     Hematocrit 29.8 (*)     Platelets 586 (*)     MPV 8.7 (*)     Neutrophils % 76.1 (*)     Neutrophils Absolute 8.70 (*)     All other components within normal limits   COMPREHENSIVE METABOLIC PANEL - Abnormal; Notable for the following components:    Glucose 212 (*)     BUN 27 (*)     Creatinine 2.20 (*)     Est, Glom Filt Rate 28 (*)     Alk Phosphatase 137 (*)     Albumin 2.7 (*)     Globulin 4.4 (*)     Albumin/Globulin Ratio 0.6 (*)     All other components within normal limits   LIPASE   URINE DRUG SCREEN       All other labs were within normal range or not returned as of this dictation.    EMERGENCY DEPARTMENT COURSE and DIFFERENTIAL DIAGNOSIS/MDM:   Vitals:    Vitals:    10/31/23 2056 10/31/23 2058 10/31/23 2100 10/31/23 2115   BP:   (!) 191/101 (!) 194/105   Pulse:  (!) 118     Resp:       Temp:       TempSrc:       SpO2: 100%  100% 93%   Weight:       Height:               Medical Decision Making  Patient is a 43 year old female with history of diabetes, gastroparesis, hypertension, hyperlipidemia, multiple admissions for intractable nausea and vomiting who presents to ED due to nausea and vomiting that started today.  She was last discharged 6 days ago.  She is actively vomiting while in triage.  She is hypertensive and tachycardic feel that this is  likely secondary to her ongoing nausea, vomiting and epigastric pain.  Will order labs and give patient IV fluids, Dilaudid and Reglan.  Will reassess.    Labs are significant for AKI.  Patient's creatinine is 2.2 which is increased from her baseline which is around 1.7.  Mild leukocytosis noted.  Patient continues to be hypertensive and tachycardic and is unable to tolerate p.o.  Will admit patient to hospital service for further evaluation management.    Perfect Serve Consult for Admission  9:32 PM    ED Room Number: ER21/21  Patient Name and age:  Melinda [name] 43 y.o.  female  Working Diagnosis: Intractable nausea and vomiting  (primary encounter diagnosis)  AKI (acute kidney injury) (HCC)    COVID-19 Suspicion: No  Sepsis present:  No  Reassessment needed: Yes  Code Status:  Full Code  Readmission: Yes  Isolation Requirements: no  Recommended Level of Care: telemetry  Department: [facility] - [phone]    Other: Patient with history of diabetes, gastroparesis, multiple admissions for intractable nausea and vomiting presents with nausea and vomiting that started earlier today.  Labs significant for AKI.  Creatinine is 2.2 which is increased from around 1.7.  She is unable to tolerate p.o.  She is also hypertensive but feel that this is likely due to patient's pain and nausea.    Total critical care time spent exclusive of procedures:   minutes.  Amount and/or Complexity of Data Reviewed  Labs: ordered.    Risk  Prescription drug management.  Decision regarding hospitalization.            REASSESSMENT            CONSULTS:  None    PROCEDURES:  Unless otherwise noted below, none     Procedures      FINAL IMPRESSION      1. Intractable nausea and vomiting    2. AKI (acute kidney injury) [facility])          DISPOSITION/PLAN   DISPOSITION Decision To Admit 10/31/2023 09:24:25 PM      PATIENT REFERRED TO:  No follow-up provider specified.    DISCHARGE MEDICATIONS:  New Prescriptions    No medications  on file         (Please note that portions of this note were completed with a voice recognition program.  Efforts were made to edit the dictations but occasionally words are mis-transcribed.)    Valere Dross, PA (electronically signed)  Physician Assistant        Valere Dross, [place]  10/31/23 2134

## 2023-10-31 NOTE — ED Notes (Signed)
Pt returned from bathroom, urine sample obtained, pt began vomiting while sitting up on side of bed

## 2023-10-31 NOTE — ED Notes (Signed)
Pt resting quietly on stretcher, wakes with verbal calling pt's name, reports pain is 8/10, aware she is being admitted to the hospital, family member at bedside

## 2023-10-31 NOTE — H&P (Signed)
Hospitalist Admission Note    NAME: Melinda [name]   DOB:  1980/11/21   MRN:  [id number]     Date/Time:  10/31/2023 9:36 PM    Patient PCP: Orlean Bradford, APRN - NP - Admission Date: 10/31/2023       Assessment & Plan:     Primary Problem:    # Intractable nausea and vomiting  # Intractable abdominal pain  -Nausea, vomiting abdominal pain have improved  -Etiology most likely secondary to gastroparesis.  Less likely hyperemesis from cannabis use despite positive UDS screen  -Will also check urinalysis given patient's pain with urination  -Admit to observation on MedSurg  -Clear liquid diet  -As needed analgesics, antiemetics  -Continue home medications  -UA ordered  -To consider restarting erythromycin, however will defer to daytime provider at this time    Active Hospital Problems:    # AKI  -Creatinine 2.20.  Notably above baseline.  -Etiology likely due to dehydration in the setting of intractable nausea and vomiting  -Strict I's and O's  -IV fluids  -Hold metformin and lisinopril  -Avoid nephrotoxic medications when possible  -Renally dose medications accordingly  -Monitor creatinine with a.m. labs  -To consider renal ultrasound and nephrology consult if AKI does not improve with current interventions     # Hypoalbuminemia  -Albumin 2.7  -Etiology unclear.  May be related to her poorly controlled diabetes or poor p.o. intake  -Check microalbumin and prealbumin    # Elevated alk phos  -Alk phos 137  -Increased risk for vitamin D deficiency in due to poor absorption  -Check GGT and vitamin D    # Anemia, normocytic  -Hemoglobin 10.2.  Appears to be above baseline  -False elevation may be due to hemoconcentration in the setting of dehydration from intractable nausea vomiting  -Denies dark and/or bloody stool  -Anemia studies ordered  -Monitor with a.m. labs  -Transfuse if hemoglobin less than 7    # Type 2 diabetes, hyperglycemia  -BG 212.  10/24/2023  Hemoglobin A1c 6.8  -Hold home metformin  -Start Lantus 10 units daily with low-dose ISS and Accu-Chek q. ACHS.      Chronic Condition as below:  Hypertension, CAD, depression, anxiety, diabetes  -No acute exacerbation of the above chronic conditions  -Lisinopril and metformin as above  -Continue remaining home medication regimens or formulary equivalents for the above    Diet: Clear liquid  Code: Full Code  IVF: NS 0.9% 75 mL/h  VTE prophylaxis: SCDs and   GI Prophylaxis: PPI    Disposition: Anticipated LOS is less than or equal to two midnights.       Chief Complaint:     Abdominal pain, nausea and vomiting    History of Present Illness:       Melinda [name] is a 43 y.o. female  with a medical history significant for DM, gastroparesis, CKD, HTN, HLD , who presents to the ER with abdominal pain, nausea and vomiting.  Started 2 days ago and has worsened since onset.  Describes abdominal pain as a deep strong generalized pain. Associated with fatigue, chills, generalized weakness, decreased urination, pain with urination.  Denies chest pain, palpitations, dyspnea, fever, body aches, chills, headaches, diaphoresis, dark and/or bloody stools, diarrhea, lower extremity edema.  Patient has been compliant with all her medications since discharge last week.  Tried home pain medication at home.  Was able to help some.  Outpatient follow-ups for PCP and GI are scheduled for next week  During her ER evaluation, patient was hemodynamically stable, however patient was tachycardic, tachypneic with elevated blood pressures. Labs were significant for elevated creatinine, elevated glucose, decreased albumin, elevated alk phos, slightly elevated WBC, decreased hemoglobin, elevated platelets.  Patient was given Dilaudid 1 mg IV x 1, morphine 4 mg IV x 1, Reglan 10 mg IV x 1, NS 0.9% 1 L bolus x 1 in the ER.  Patient will be admitted for further evaluation and management for her ongoing AKI related to her intractable nausea and vomiting.         Review of Systems:     Review of Systems   Constitutional:  Positive for chills and fatigue. Negative for diaphoresis and fever.   Eyes:  Negative for visual disturbance.   Respiratory:  Negative for shortness of breath.    Cardiovascular:  Negative for chest pain, palpitations and leg swelling.   Gastrointestinal:  Positive for abdominal pain, nausea and vomiting. Negative for blood in stool and diarrhea.        -dark stool   Genitourinary:  Positive for decreased urine volume and dysuria. Negative for frequency.   Musculoskeletal:  Negative for myalgias.   Skin:  Negative for color change.   Neurological:  Positive for weakness (generalized). Negative for headaches.       Home Medications:     Prior to Admission medications    Medication Sig Start Date End Date Taking? Authorizing Provider   cloNIDine (CATAPRES) 0.2 MG/24HR PTWK Place 1 patch onto the skin once a week for 7 days 11/01/23 11/08/23  Renelda Loma, MD   prochlorperazine (COMPAZINE) 10 MG tablet Take 1 tablet by mouth every 6 hours as needed (nausea or vomiting) 10/25/23   Renelda Loma, MD   metoclopramide (REGLAN) 10 MG tablet Take 1 tablet by mouth 4 times daily as needed (nausea, vomiting) 10/25/23 11/24/23  Renelda Loma, MD   metFORMIN (GLUCOPHAGE-XR) 500 MG extended release tablet Take 2 tablets by mouth daily (with breakfast) 10/25/23   Renelda Loma, MD   busPIRone (BUSPAR) 15 MG tablet Take 15 mg by mouth 3 times daily 10/14/23   Tefera, Mesfin A, MD   DULoxetine (CYMBALTA) 60 MG extended release capsule Take 1 capsule by mouth daily 10/15/23   Tefera, Mesfin A, MD   clopidogrel (PLAVIX) 75 MG tablet Take 1 tablet by mouth daily 10/15/23   Tefera, Mesfin A, MD   aspirin 81 MG chewable tablet Take 1 tablet by mouth daily 10/15/23   Mylo Red, Mesfin A, MD   linaclotide (LINZESS) 290 MCG CAPS capsule Take 1 capsule by mouth every morning (before breakfast) 09/23/23   Muncy, Madelaine Bhat, MD   amitriptyline (ELAVIL) 10 MG tablet Take 1 tablet by mouth nightly  09/14/23 12/13/23  Lyell, Arlington Calix, MD   polyethylene glycol [facility]) 17 GM/SCOOP powder Take 17 g by mouth 2 times daily 09/14/23 03/12/24  Lyell, Arlington Calix, MD   dicyclomine (BENTYL) 20 MG tablet Take 1 tablet by mouth every 6 hours as needed (abdominal cramping) 09/14/23   Lyell, Arlington Calix, MD   ondansetron (ZOFRAN-ODT) 4 MG disintegrating tablet Take 1 tablet by mouth every 8 hours as needed for Nausea or Vomiting 05/29/23   Renelda Loma, MD   pantoprazole (PROTONIX) 40 MG tablet Take 1 tablet by mouth 2 times daily (before meals) 05/29/23   Renelda Loma, MD   lisinopril (PRINIVIL;ZESTRIL) 40 MG tablet Take 1 tablet by mouth daily 03/23/23   Do, Twanna Hy, MD   metoprolol succinate (TOPROL  XL) 50 MG extended release tablet Take 1 tablet by mouth daily 03/23/23   Do, Khoi B, MD   amLODIPine (NORVASC) 10 MG tablet Take 1 tablet by mouth daily 03/23/23   Do, Twanna Hy, MD       Allergies:     No Known Allergies     Past Surgical History:     Past Surgical History:   Procedure Laterality Date    CAPSULE ENDOSCOPY N/A 01/20/2023    ESOPHAGEAL CAPSULE ENDOSCOPY remove at 1624PM performed by Glyn Ade, MD at [facility] ENDOSCOPY    CHOLECYSTECTOMY, LAPAROSCOPIC N/A 02/03/2023    ROBOTIC LAPAROSCOPIC CHOLECYSTECTOMY with Indocyanine green performed by Verdis Frederickson, MD at [facility] MAIN OR    COLONOSCOPY N/A 01/19/2023    COLONOSCOPY DIAGNOSTIC performed by Glyn Ade, MD at [facility] ENDOSCOPY    INVASIVE VASCULAR N/A 10/03/2023    Angiography visceral SMA performed by Natasha Mead, MD at [facility] CARDIAC CATH LAB    INVASIVE VASCULAR N/A 10/03/2023    Ultrasound guided vascular access performed by Natasha Mead, MD at [facility] CARDIAC CATH LAB    INVASIVE VASCULAR N/A 10/03/2023    Insert stent peripheral artery performed by Natasha Mead, MD at [facility] CARDIAC CATH LAB    IR NONTUNNELED VASCULAR CATHETER > 5 YEARS  10/10/2023    IR NONTUNNELED VASCULAR CATHETER > 5 YEARS 10/10/2023 [facility] CARDIAC CATH/EP/IR LAB    IR NONTUNNELED VASCULAR CATHETER > 5 YEARS   10/10/2023    IR NONTUNNELED VASCULAR CATHETER > 5 YEARS 10/10/2023 [facility] CARDIAC CATH/EP/IR LAB    OTHER SURGICAL HISTORY Left     Rentia attachment    TUBAL LIGATION Bilateral     UPPER GASTROINTESTINAL ENDOSCOPY N/A 01/17/2023    ESOPHAGOGASTRODUODENOSCOPY performed by Glyn Ade, MD at [facility] ENDOSCOPY    UPPER GASTROINTESTINAL ENDOSCOPY N/A 01/17/2023    ESOPHAGOGASTRODUODENOSCOPY BIOPSY performed by Glyn Ade, MD at [facility] ENDOSCOPY    UPPER GASTROINTESTINAL ENDOSCOPY N/A 01/18/2023    ESOPHAGOGASTRODUODENOSCOPY performed by Glyn Ade, MD at [facility] ENDOSCOPY    UPPER GASTROINTESTINAL ENDOSCOPY N/A 05/13/2023    ESOPHAGOGASTRODUODENOSCOPY performed by Leta Speller, MD at [facility] ENDOSCOPY    UPPER GASTROINTESTINAL ENDOSCOPY N/A 05/13/2023    ESOPHAGOGASTRODUODENOSCOPY BIOPSY performed by Leta Speller, MD at [facility] ENDOSCOPY    UPPER GASTROINTESTINAL ENDOSCOPY N/A 09/21/2023    ESOPHAGOGASTRODUODENOSCOPY performed by Glyn Ade, MD at [facility] ENDOSCOPY    UPPER GASTROINTESTINAL ENDOSCOPY N/A 09/21/2023    ESOPHAGOGASTRODUODENOSCOPY BIOPSY performed by Glyn Ade, MD at [facility] ENDOSCOPY    [place] ABSCESS DRAINAGE PERITONEAL/RETROPERITONEAL PERC  02/11/2023    [place] ABSCESS DRAINAGE PERITONEAL 02/11/2023 [facility] [place]        Past Medical History:     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     Social History:     Social History     Tobacco Use    Smoking status: Never    Smokeless tobacco: Never   Substance Use Topics    Alcohol use: Never        Physical Exam:     Vitals:  10/31/23 2130   BP: (!) 191/109   Pulse:    Resp:    Temp:    SpO2: 95%     SpO2 Readings from Last 6 Encounters:   10/31/23 95%   10/25/23 96%   10/14/23 98%   09/22/23 98%   09/14/23 100%   08/31/23 100%           Intake/Output Summary (Last 24 hours) at 10/31/2023 2136  Last data filed at 10/31/2023 2135  Gross per 24 hour   Intake 1000 ml   Output --   Net 1000 ml        Physical Exam  Constitutional:       General: She is awake. She is not in acute distress.     Appearance: Normal appearance. She is well-developed and well-groomed. She is not ill-appearing or diaphoretic.      Comments: +appears exhuasted   HENT:      Head: Normocephalic and atraumatic.   Eyes:      General: No scleral icterus.  Cardiovascular:      Rate and Rhythm: Regular rhythm. Tachycardia present.      Pulses:           Posterior tibial pulses are 2+ on the right side and 2+ on the left side.      Heart sounds: No murmur heard.  Pulmonary:      Effort: Pulmonary effort is normal. No respiratory distress.      Breath sounds: Normal breath sounds.   Abdominal:      General: Abdomen is flat. Bowel sounds are decreased.      Palpations: Abdomen is soft.      Tenderness: There is generalized abdominal tenderness (mild).   Musculoskeletal:      Right lower leg: No edema.      Left lower leg: No edema.   Skin:     General: Skin is warm and dry.      Coloration: Skin is not jaundiced.   Neurological:      Mental Status: She is alert. Mental status is at baseline.   Psychiatric:         Mood and Affect: Mood normal.         Behavior: Behavior normal. Behavior is cooperative.         Thought Content: Thought content normal.         Judgment: Judgment normal.         Recent Results & Studies:     I have reviewed the following pertinent result(s):     Labs:  Labs Reviewed   CBC WITH AUTO DIFFERENTIAL - Abnormal; Notable for the following components:       Result Value    WBC 11.4 (*)     RBC 3.56 (*)     Hemoglobin 10.2 (*)     Hematocrit 29.8 (*)     Platelets 586 (*)     MPV 8.7 (*)     Neutrophils % 76.1 (*)     Neutrophils Absolute 8.70 (*)     All other components within normal limits   COMPREHENSIVE METABOLIC PANEL - Abnormal; Notable for the following  components:    Glucose 212 (*)     BUN 27 (*)     Creatinine 2.20 (*)     Est, Glom Filt Rate 28 (*)     Alk Phosphatase 137 (*)     Albumin 2.7 (*)     Globulin 4.4 (*)  Albumin/Globulin Ratio 0.6 (*)     All other components within normal limits   LIPASE   URINE DRUG SCREEN       Radiology:  Interpretation per the Radiologist below, if available at the time of this note:    No orders to display            Medical Decision Making:       Total time spent counseling, discussing lab and imaging results, creating/addressing management plans, and coordinating care: 80 minutes, including face to face time (obtaining history and physical exam) and clinical time (reviewing previous labs/imaging reports, ordering additional imaging, labs, changing medications, and documenting in the record). This excludes procedure time unless otherwise indicated.       Electronically Signed by:  Dina Rich Lynann Beaver, DO     10/31/2023, 9:36 PM        This note was dictated utilizing Dragon Voice Recognition Software. Typographical and grammatical errors may exist due to voice recognition incompatibilities.

## 2023-10-31 NOTE — ED Triage Notes (Signed)
Pt presents to the ER for c/o abdominal pain w/ nausea and vomiting that began today. She was recently admitted for same symptoms. Has hx of gastroparesis. Stated she felt better upon discharge until today. Has taken meds about 4 hours ago for pain and nausea.

## 2023-11-01 ENCOUNTER — Inpatient Hospital Stay
Admission: EM | Admit: 2023-11-01 | Discharge: 2023-11-06 | Disposition: A | Discharge status: Home / Self Care | Payer: MEDICAID | Admitting: Internal Medicine

## 2023-11-01 LAB — CBC
Hematocrit: 27.2 % — ABNORMAL LOW (ref 35.0–47.0)
Hemoglobin: 9.2 g/dL — ABNORMAL LOW (ref 11.5–16.0)
MCH: 29.4 pg (ref 26.0–34.0)
MCHC: 33.8 g/dL (ref 30.0–36.5)
MCV: 86.9 fL (ref 80.0–99.0)
MPV: 8.4 fL — ABNORMAL LOW (ref 8.9–12.9)
Nucleated RBCs: 0 /100{WBCs}
Platelets: 459 10*3/uL — ABNORMAL HIGH (ref 150–400)
RBC: 3.13 M/uL — ABNORMAL LOW (ref 3.80–5.20)
RDW: 13.7 % (ref 11.5–14.5)
WBC: 11.4 10*3/uL — ABNORMAL HIGH (ref 3.6–11.0)
nRBC: 0 10*3/uL (ref 0.00–0.01)

## 2023-11-01 LAB — VITAMIN B12 & FOLATE
Folate: 17.5 ng/mL (ref 5.0–21.0)
Vitamin B-12: >2000 pg/mL — ABNORMAL HIGH (ref 193–986)

## 2023-11-01 LAB — BASIC METABOLIC PANEL
Anion Gap: 3 mmol/L (ref 2–12)
BUN/Creatinine Ratio: 13 (ref 12–20)
BUN: 26 mg/dL — ABNORMAL HIGH (ref 6–20)
CO2: 30 mmol/L (ref 21–32)
Calcium: 8.5 mg/dL (ref 8.5–10.1)
Chloride: 108 mmol/L (ref 97–108)
Creatinine: 2.04 mg/dL — ABNORMAL HIGH (ref 0.55–1.02)
Est, Glom Filt Rate: 31 mL/min/{1.73_m2} — ABNORMAL LOW (ref >60)
Glucose: 196 mg/dL — ABNORMAL HIGH (ref 65–100)
Potassium: 3.8 mmol/L (ref 3.5–5.1)
Sodium: 141 mmol/L (ref 136–145)

## 2023-11-01 LAB — CBC WITH AUTO DIFFERENTIAL
Basophils %: 0.6 % (ref 0.0–1.0)
Basophils Absolute: 0.07 10*3/uL (ref 0.00–0.10)
Eosinophils %: 1.3 % (ref 0.0–7.0)
Eosinophils Absolute: 0.15 10*3/uL (ref 0.00–0.40)
Hematocrit: 29.8 % — ABNORMAL LOW (ref 35.0–47.0)
Hemoglobin: 10.2 g/dL — ABNORMAL LOW (ref 11.5–16.0)
Immature Granulocytes %: 0.3 % (ref 0.0–0.5)
Immature Granulocytes Absolute: 0.03 10*3/uL (ref 0.00–0.04)
Lymphocytes %: 15.3 % (ref 12.0–49.0)
Lymphocytes Absolute: 1.75 10*3/uL (ref 0.80–3.50)
MCH: 28.7 pg (ref 26.0–34.0)
MCHC: 34.2 g/dL (ref 30.0–36.5)
MCV: 83.7 fL (ref 80.0–99.0)
MPV: 8.7 fL — ABNORMAL LOW (ref 8.9–12.9)
Monocytes %: 6.4 % (ref 5.0–13.0)
Monocytes Absolute: 0.73 10*3/uL (ref 0.00–1.00)
Neutrophils %: 76.1 % — ABNORMAL HIGH (ref 32.0–75.0)
Neutrophils Absolute: 8.7 10*3/uL — ABNORMAL HIGH (ref 1.80–8.00)
Nucleated RBCs: 0 /100{WBCs}
Platelets: 586 10*3/uL — ABNORMAL HIGH (ref 150–400)
RBC: 3.56 M/uL — ABNORMAL LOW (ref 3.80–5.20)
RDW: 13.4 % (ref 11.5–14.5)
WBC: 11.4 10*3/uL — ABNORMAL HIGH (ref 3.6–11.0)
nRBC: 0 10*3/uL (ref 0.00–0.01)

## 2023-11-01 LAB — COMPREHENSIVE METABOLIC PANEL
ALT: 58 U/L (ref 12–78)
AST: 36 U/L (ref 15–37)
Albumin/Globulin Ratio: 0.6 — ABNORMAL LOW (ref 1.1–2.2)
Albumin: 2.7 g/dL — ABNORMAL LOW (ref 3.5–5.0)
Alk Phosphatase: 137 U/L — ABNORMAL HIGH (ref 45–117)
Anion Gap: 8 mmol/L (ref 2–12)
BUN/Creatinine Ratio: 12 (ref 12–20)
BUN: 27 mg/dL — ABNORMAL HIGH (ref 6–20)
CO2: 28 mmol/L (ref 21–32)
Calcium: 9.2 mg/dL (ref 8.5–10.1)
Chloride: 104 mmol/L (ref 97–108)
Creatinine: 2.2 mg/dL — ABNORMAL HIGH (ref 0.55–1.02)
Est, Glom Filt Rate: 28 mL/min/{1.73_m2} — ABNORMAL LOW (ref >60)
Globulin: 4.4 g/dL — ABNORMAL HIGH (ref 2.0–4.0)
Glucose: 212 mg/dL — ABNORMAL HIGH (ref 65–100)
Potassium: 3.6 mmol/L (ref 3.5–5.1)
Sodium: 140 mmol/L (ref 136–145)
Total Bilirubin: 0.4 mg/dL (ref 0.2–1.0)
Total Protein: 7.1 g/dL (ref 6.4–8.2)

## 2023-11-01 LAB — IRON AND TIBC
Iron % Saturation: 33 % (ref 20–50)
Iron: 64 ug/dL (ref 35–150)
TIBC: 195 ug/dL — ABNORMAL LOW (ref 250–450)

## 2023-11-01 LAB — ALBUMIN/CREATININE RATIO, URINE
Albumin Urine: 638 mg/dL
Albumin/Creatinine Ratio: 11475 mg/g — ABNORMAL HIGH (ref 0–30)
Creatinine, Ur: 55.6 mg/dL

## 2023-11-01 LAB — URINALYSIS WITH MICROSCOPIC
Bilirubin, Urine: NEGATIVE
Blood, Urine: NEGATIVE
Glucose, Ur: 250 mg/dL — AB
Ketones, Urine: NEGATIVE mg/dL
Leukocyte Esterase, Urine: NEGATIVE
Nitrite, Urine: NEGATIVE
Protein, UA: >300 mg/dL — AB
Specific Gravity, UA: 1.016 (ref 1.003–1.030)
Urobilinogen, Urine: 0.2 U/dL (ref 0.2–1.0)
pH, Urine: 7.5 (ref 5.0–8.0)

## 2023-11-01 LAB — GAMMA GT: GGT: 136 U/L — ABNORMAL HIGH (ref 5–55)

## 2023-11-01 LAB — FERRITIN: Ferritin: 175 ng/mL (ref 26–388)

## 2023-11-01 LAB — VITAMIN D 25 HYDROXY: Vit D, 25-Hydroxy: <9 ng/mL — ABNORMAL LOW (ref 30–100)

## 2023-11-01 LAB — URINE DRUG SCREEN
Amphetamine, Urine: NEGATIVE
Barbiturates, Urine: NEGATIVE
Benzodiazepines, Urine: NEGATIVE
Cocaine, Urine: NEGATIVE
Methadone, Urine: NEGATIVE
Opiates, Urine: NEGATIVE
Phencyclidine, Urine: NEGATIVE
THC, TH-Cannabinol, Urine: POSITIVE — AB

## 2023-11-01 LAB — POCT GLUCOSE
POC Glucose: 201 mg/dL — ABNORMAL HIGH (ref 65–117)
POC Glucose: 130 mg/dL — ABNORMAL HIGH (ref 65–117)
POC Glucose: 189 mg/dL — ABNORMAL HIGH (ref 65–117)

## 2023-11-01 LAB — URINE CULTURE HOLD SAMPLE

## 2023-11-01 LAB — PREALBUMIN: Prealbumin: 27.6 mg/dL (ref 20.0–40.0)

## 2023-11-01 LAB — MAGNESIUM: Magnesium: 2 mg/dL (ref 1.6–2.4)

## 2023-11-01 LAB — LIPASE: Lipase: 18 U/L (ref 13–75)

## 2023-11-01 MED ORDER — POTASSIUM CHLORIDE 10 MEQ/100ML IV SOLN
10 MEQ/0ML | INTRAVENOUS | Status: DC | PRN
Start: 2023-11-01 — End: 2023-11-03

## 2023-11-01 MED ORDER — TRAMADOL HCL 50 MG PO TABS
50 MG | Freq: Four times a day (QID) | ORAL | Status: DC | PRN
Start: 2023-11-01 — End: 2023-11-06
  Administered 2023-11-02 (×2): 50 mg via ORAL

## 2023-11-01 MED ORDER — NORMAL SALINE FLUSH 0.9 % IV SOLN
0.9 % | INTRAVENOUS | Status: DC | PRN
Start: 2023-11-01 — End: 2023-11-06

## 2023-11-01 MED ORDER — AMLODIPINE BESYLATE 5 MG PO TABS
5 | ORAL | Status: AC
Start: 2023-11-01 — End: 2023-10-31
  Administered 2023-11-01: 04:00:00 10 mg via ORAL

## 2023-11-01 MED ORDER — DEXTROSE 10 % IV SOLN
10 % | INTRAVENOUS | Status: DC | PRN
Start: 2023-11-01 — End: 2023-11-06

## 2023-11-01 MED ORDER — METOCLOPRAMIDE HCL 5 MG/ML IJ SOLN
5 | Freq: Four times a day (QID) | INTRAMUSCULAR | Status: DC
Start: 2023-11-01 — End: 2023-11-01
  Administered 2023-11-01 (×2): 10 mg via INTRAVENOUS

## 2023-11-01 MED ORDER — HYDROMORPHONE 0.5MG/0.5ML IJ SOLN
1 MG/ML | Status: DC | PRN
Start: 2023-11-01 — End: 2023-11-05
  Administered 2023-11-05 (×2): 0.25 mg via INTRAVENOUS

## 2023-11-01 MED ORDER — CAPSAICIN 0.025 % EX CREA
0.025 % | Freq: Two times a day (BID) | CUTANEOUS | Status: DC
Start: 2023-11-01 — End: 2023-11-06
  Administered 2023-11-03 – 2023-11-06 (×6): via TOPICAL

## 2023-11-01 MED ORDER — CLOPIDOGREL BISULFATE 75 MG PO TABS
75 MG | Freq: Every day | ORAL | Status: DC
Start: 2023-11-01 — End: 2023-11-06
  Administered 2023-11-01 – 2023-11-06 (×5): 75 mg via ORAL

## 2023-11-01 MED ORDER — INSULIN LISPRO 100 UNIT/ML IJ SOLN
100 UNIT/ML | Freq: Four times a day (QID) | INTRAMUSCULAR | Status: DC
Start: 2023-11-01 — End: 2023-11-06
  Administered 2023-11-01: 14:00:00 1 [IU] via SUBCUTANEOUS
  Administered 2023-11-02: 03:00:00 2 [IU] via SUBCUTANEOUS

## 2023-11-01 MED ORDER — GLUCOSE 4 G PO CHEW
4 g | ORAL | Status: DC | PRN
Start: 2023-11-01 — End: 2023-11-06

## 2023-11-01 MED ORDER — POTASSIUM CHLORIDE ER 10 MEQ PO TBCR
10 MEQ | ORAL | Status: DC | PRN
Start: 2023-11-01 — End: 2023-11-03
  Administered 2023-11-03: 11:00:00 40 meq via ORAL

## 2023-11-01 MED ORDER — DEXTROSE 10 % IV BOLUS
INTRAVENOUS | Status: DC | PRN
Start: 2023-11-01 — End: 2023-11-06

## 2023-11-01 MED ORDER — METOPROLOL SUCCINATE ER 25 MG PO TB24
25 | ORAL | Status: AC
Start: 2023-11-01 — End: 2023-10-31
  Administered 2023-11-01: 04:00:00 50 mg via ORAL

## 2023-11-01 MED ORDER — SODIUM CHLORIDE 0.9 % IV BOLUS
0.9 | Freq: Once | INTRAVENOUS | Status: AC
Start: 2023-11-01 — End: 2023-10-31
  Administered 2023-11-01: 02:00:00 1000 mL via INTRAVENOUS

## 2023-11-01 MED ORDER — TRAZODONE HCL 50 MG PO TABS
50 MG | Freq: Every evening | ORAL | Status: DC | PRN
Start: 2023-11-01 — End: 2023-11-06

## 2023-11-01 MED ORDER — METOCLOPRAMIDE HCL 5 MG/ML IJ SOLN
5 MG/ML | Freq: Four times a day (QID) | INTRAMUSCULAR | Status: DC | PRN
Start: 2023-11-01 — End: 2023-11-05
  Administered 2023-11-01: 19:00:00 5 mg via INTRAVENOUS

## 2023-11-01 MED ORDER — SODIUM CHLORIDE 0.9 % IV SOLN
0.9 | INTRAVENOUS | Status: AC
Start: 2023-11-01 — End: 2023-11-02
  Administered 2023-11-01: 13:00:00 via INTRAVENOUS

## 2023-11-01 MED ORDER — PANTOPRAZOLE SODIUM 40 MG PO TBEC
40 MG | Freq: Two times a day (BID) | ORAL | Status: DC
Start: 2023-11-01 — End: 2023-11-06
  Administered 2023-11-01 – 2023-11-06 (×10): 40 mg via ORAL

## 2023-11-01 MED ORDER — GLUCAGON (RDNA) 1 MG IJ KIT
1 MG | INTRAMUSCULAR | Status: DC | PRN
Start: 2023-11-01 — End: 2023-11-06

## 2023-11-01 MED ORDER — MELATONIN 3 MG PO TABS
3 MG | Freq: Every evening | ORAL | Status: DC | PRN
Start: 2023-11-01 — End: 2023-11-06
  Administered 2023-11-03 – 2023-11-06 (×2): 6 mg via ORAL

## 2023-11-01 MED ORDER — HYDRALAZINE HCL 20 MG/ML IJ SOLN
20 MG/ML | Freq: Four times a day (QID) | INTRAMUSCULAR | Status: DC | PRN
Start: 2023-11-01 — End: 2023-11-06
  Administered 2023-11-02 – 2023-11-03 (×4): 10 mg via INTRAVENOUS

## 2023-11-01 MED ORDER — HYDROMORPHONE 0.5MG/0.5ML IJ SOLN
1 MG/ML | Status: DC | PRN
Start: 2023-11-01 — End: 2023-11-05
  Administered 2023-11-01 – 2023-11-04 (×20): 0.5 mg via INTRAVENOUS

## 2023-11-01 MED ORDER — HYDROMORPHONE HCL PF 1 MG/ML IJ SOLN
1 | INTRAMUSCULAR | Status: AC
Start: 2023-11-01 — End: 2023-10-31
  Administered 2023-11-01: 02:00:00 1 mg via INTRAVENOUS

## 2023-11-01 MED ORDER — ACETAMINOPHEN 325 MG PO TABS
325 MG | Freq: Four times a day (QID) | ORAL | Status: DC | PRN
Start: 2023-11-01 — End: 2023-11-06
  Administered 2023-11-03: 17:00:00 650 mg via ORAL

## 2023-11-01 MED ORDER — ACETAMINOPHEN 650 MG RE SUPP
650 MG | Freq: Four times a day (QID) | RECTAL | Status: DC | PRN
Start: 2023-11-01 — End: 2023-11-06

## 2023-11-01 MED ORDER — NORMAL SALINE FLUSH 0.9 % IV SOLN
0.9 % | Freq: Two times a day (BID) | INTRAVENOUS | Status: DC
Start: 2023-11-01 — End: 2023-11-06
  Administered 2023-11-01 – 2023-11-06 (×9): 10 mL via INTRAVENOUS

## 2023-11-01 MED ORDER — ACETAMINOPHEN 325 MG PO TABS
325 MG | ORAL | Status: DC | PRN
Start: 2023-11-01 — End: 2023-11-06

## 2023-11-01 MED ORDER — POTASSIUM BICARB-CITRIC ACID 20 MEQ PO TBEF
20 MEQ | ORAL | Status: DC | PRN
Start: 2023-11-01 — End: 2023-11-03

## 2023-11-01 MED ORDER — HYDRALAZINE HCL 20 MG/ML IJ SOLN
20 | Freq: Four times a day (QID) | INTRAMUSCULAR | Status: DC | PRN
Start: 2023-11-01 — End: 2023-11-01

## 2023-11-01 MED ORDER — METOPROLOL SUCCINATE ER 50 MG PO TB24
50 MG | Freq: Every day | ORAL | Status: DC
Start: 2023-11-01 — End: 2023-11-03
  Administered 2023-11-01 – 2023-11-02 (×2): 50 mg via ORAL

## 2023-11-01 MED ORDER — PROCHLORPERAZINE EDISYLATE 10 MG/2ML IJ SOLN
10 MG/2ML | Freq: Four times a day (QID) | INTRAMUSCULAR | Status: DC | PRN
Start: 2023-11-01 — End: 2023-11-06
  Administered 2023-11-01 – 2023-11-04 (×7): 10 mg via INTRAVENOUS

## 2023-11-01 MED ORDER — INSULIN GLARGINE 100 UNIT/ML SC SOLN
100 UNIT/ML | Freq: Every day | SUBCUTANEOUS | Status: DC
Start: 2023-11-01 — End: 2023-11-06
  Administered 2023-11-01 – 2023-11-05 (×5): 10 [IU] via SUBCUTANEOUS

## 2023-11-01 MED ORDER — AMITRIPTYLINE HCL 10 MG PO TABS
10 MG | Freq: Every evening | ORAL | Status: DC
Start: 2023-11-01 — End: 2023-11-06
  Administered 2023-11-02 – 2023-11-06 (×5): 10 mg via ORAL

## 2023-11-01 MED ORDER — BUSPIRONE HCL 15 MG PO TABS
15 MG | Freq: Three times a day (TID) | ORAL | Status: DC
Start: 2023-11-01 — End: 2023-11-06
  Administered 2023-11-01 – 2023-11-06 (×14): 15 mg via ORAL

## 2023-11-01 MED ORDER — SODIUM CHLORIDE 0.9 % IV SOLN
0.9 % | INTRAVENOUS | Status: DC | PRN
Start: 2023-11-01 — End: 2023-11-06

## 2023-11-01 MED ORDER — MAGNESIUM SULFATE 2000 MG/50 ML IVPB PREMIX
2 GM/50ML | INTRAVENOUS | Status: DC | PRN
Start: 2023-11-01 — End: 2023-11-03

## 2023-11-01 MED ORDER — DULOXETINE HCL 30 MG PO CPEP
30 MG | Freq: Every day | ORAL | Status: DC
Start: 2023-11-01 — End: 2023-11-06
  Administered 2023-11-01 – 2023-11-06 (×5): 60 mg via ORAL

## 2023-11-01 MED ORDER — DICYCLOMINE HCL 20 MG PO TABS
20 MG | Freq: Four times a day (QID) | ORAL | Status: DC | PRN
Start: 2023-11-01 — End: 2023-11-06
  Administered 2023-11-01: 13:00:00 20 mg via ORAL

## 2023-11-01 MED ORDER — MORPHINE SULFATE (PF) 4 MG/ML IJ SOLN
4 | INTRAMUSCULAR | Status: DC
Start: 2023-11-01 — End: 2023-10-31

## 2023-11-01 MED ORDER — AMLODIPINE BESYLATE 5 MG PO TABS
5 MG | Freq: Every day | ORAL | Status: DC
Start: 2023-11-01 — End: 2023-11-06
  Administered 2023-11-01 – 2023-11-06 (×6): 10 mg via ORAL

## 2023-11-01 MED ORDER — ASPIRIN 81 MG PO CHEW
81 MG | Freq: Every day | ORAL | Status: DC
Start: 2023-11-01 — End: 2023-11-06
  Administered 2023-11-01 – 2023-11-06 (×5): 81 mg via ORAL

## 2023-11-01 MED FILL — HYDROMORPHONE HCL 1 MG/ML IJ SOLN: 1 MG/ML | INTRAMUSCULAR | Qty: 0.5

## 2023-11-01 MED FILL — PANTOPRAZOLE SODIUM 40 MG PO TBEC: 40 MG | ORAL | Qty: 1

## 2023-11-01 MED FILL — PROCHLORPERAZINE EDISYLATE 10 MG/2ML IJ SOLN: 10 MG/2ML | INTRAMUSCULAR | Qty: 2

## 2023-11-01 MED FILL — AMLODIPINE BESYLATE 5 MG PO TABS: 5 MG | ORAL | Qty: 2

## 2023-11-01 MED FILL — METOPROLOL SUCCINATE ER 25 MG PO TB24: 25 MG | ORAL | Qty: 2

## 2023-11-01 MED FILL — ASPIRIN LOW DOSE 81 MG PO CHEW: 81 MG | ORAL | Qty: 1

## 2023-11-01 MED FILL — METOCLOPRAMIDE HCL 5 MG/ML IJ SOLN: 5 MG/ML | INTRAMUSCULAR | Qty: 2

## 2023-11-01 MED FILL — LANTUS 100 UNIT/ML SC SOLN: 100 UNIT/ML | SUBCUTANEOUS | Qty: 10

## 2023-11-01 MED FILL — BUSPIRONE HCL 15 MG PO TABS: 15 MG | ORAL | Qty: 1

## 2023-11-01 MED FILL — CAPSAICIN 0.025 % EX CREA: 0.025 % | CUTANEOUS | Qty: 60

## 2023-11-01 MED FILL — DEXTROSE 10 % IV SOLN: 10 % | INTRAVENOUS | Qty: 500

## 2023-11-01 MED FILL — SODIUM CHLORIDE 0.9 % IV SOLN: 0.9 % | INTRAVENOUS | Qty: 1000

## 2023-11-01 MED FILL — DULOXETINE HCL 30 MG PO CPEP: 30 MG | ORAL | Qty: 2

## 2023-11-01 MED FILL — DICYCLOMINE HCL 20 MG PO TABS: 20 MG | ORAL | Qty: 1

## 2023-11-01 MED FILL — INSULIN LISPRO 100 UNIT/ML IJ SOLN: 100 UNIT/ML | INTRAMUSCULAR | Qty: 2

## 2023-11-01 MED FILL — BD POSIFLUSH 0.9 % IV SOLN: 0.9 % | INTRAVENOUS | Qty: 40

## 2023-11-01 MED FILL — HYDROMORPHONE HCL 1 MG/ML IJ SOLN: 1 MG/ML | INTRAMUSCULAR | Qty: 1

## 2023-11-01 MED FILL — CLOPIDOGREL BISULFATE 75 MG PO TABS: 75 MG | ORAL | Qty: 1

## 2023-11-01 NOTE — ED Notes (Addendum)
Pt alert, oriented. States pain is currently 8/10. I introduced myself and told pt she will be going up to her room once report is called.

## 2023-11-01 NOTE — Consults (Signed)
Session ID: [id number]  Language: Spanish  Interpreter ID: #[id number]  Interpreter Name: Mariane Baumgarten

## 2023-11-01 NOTE — ED Notes (Signed)
Report to Monique, RN

## 2023-11-01 NOTE — Consults (Signed)
Melinda Bright, PA-C                       [phone] office             Monday-Friday 8:00 am-4:30 pm  I am not permitted to use "perfect serve" use above for contact, thanks.      Gastroenterology Consultation Note      Admit Date: 10/31/2023  Consult Date: 11/01/2023   I greatly appreciate your asking me to see Melinda [name], thank you very much for the opportunity to participate in her care.    Narrative Assessment and Plan   43 year old female presenting to the ER with nausea, vomiting, and abdominal pain.      Recent CT abdomen pelvis without contrast 10/21/2023 with no acute abnormalities.  Upper GI series 10/14/2023 normal.  Gastric emptying study 10/13/2023 with delayed gastric emptying.  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      Urine porphobilinogen within normal limits at 1.9 09/28/2023.      Repeat iron studies done 10/31/2023: Iron saturation within normal limits at 33%, ferritin within normal limits at 175, iron within normal is a 64, TIBC low at 195.      Urine drug screen is positive for THC, although she denies recent marijuana use.    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  Diet as tolerated.  Recommend strict cessation of THC.  Will complete porphyria workup while the patient is here.    [name], PA-C    Discussed with [name], agree with above plan.  Majority of MDM hers.  [name], MD     Subjective:     Chief Complaint: Nausea and vomiting    History of Present Illness: GI consultation requested by Dr. Glennie Hawk for intractable nausea and vomiting.  43 year old female with past medical history of type 2 diabetes, CKD, HTN, HLD, recurrent admissions with abdominal pain, nausea and vomiting, presenting to the ER with abdominal  pain, nausea, and vomiting.  Previous evaluation for abdominal pain, nausea, and vomiting has included EGD, colonoscopy, video capsule endoscopy, CT abdomen pelvis with contrast, CT angiography, SMA stenting, gastric emptying studies (the most recent demonstrating delayed gastric emptying - possibly related to narcotic pain medications vs gastroparesis), and MRCP.    At the time of visit, she endorses significant nausea and vomiting which started about 2 days ago.  She had a few days of feeling a little bit better after her last discharge which was 10/25/2023.  She describes the pain as constant, throughout her entire abdomen.  She denies recent changes to her diet or medications.  She denies any recent marijuana use.    Remote Spanish interpreter [place], [place] # [phone], provided translation services for this encounter.       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)     Gastroparesis     Gastroparesis     GERD (gastroesophageal reflux disease)     High cholesterol     Hypertension         Past Surgical History:   Procedure Laterality Date    CAPSULE ENDOSCOPY N/A 01/20/2023    ESOPHAGEAL CAPSULE  ENDOSCOPY remove at 1624PM performed by Glyn Ade, MD at [facility] ENDOSCOPY    CHOLECYSTECTOMY, LAPAROSCOPIC N/A 02/03/2023    ROBOTIC LAPAROSCOPIC CHOLECYSTECTOMY with Indocyanine green performed by Verdis Frederickson, MD at [facility] MAIN OR    COLONOSCOPY N/A 01/19/2023    COLONOSCOPY DIAGNOSTIC performed by Glyn Ade, MD at [facility] ENDOSCOPY    INVASIVE VASCULAR N/A 10/03/2023    Angiography visceral SMA performed by Natasha Mead, MD at [facility] CARDIAC CATH LAB    INVASIVE VASCULAR N/A 10/03/2023    Ultrasound guided vascular access performed by Natasha Mead, MD at [facility] CARDIAC CATH LAB    INVASIVE VASCULAR N/A 10/03/2023    Insert stent peripheral artery performed by Natasha Mead, MD at [facility] CARDIAC CATH LAB    IR  NONTUNNELED VASCULAR CATHETER > 5 YEARS  10/10/2023    IR NONTUNNELED VASCULAR CATHETER > 5 YEARS 10/10/2023 [facility] CARDIAC CATH/EP/IR LAB    IR NONTUNNELED VASCULAR CATHETER > 5 YEARS  10/10/2023    IR NONTUNNELED VASCULAR CATHETER > 5 YEARS 10/10/2023 [facility] CARDIAC CATH/EP/IR LAB    OTHER SURGICAL HISTORY Left     Rentia attachment    TUBAL LIGATION Bilateral     UPPER GASTROINTESTINAL ENDOSCOPY N/A 01/17/2023    ESOPHAGOGASTRODUODENOSCOPY performed by Glyn Ade, MD at [facility] ENDOSCOPY    UPPER GASTROINTESTINAL ENDOSCOPY N/A 01/17/2023    ESOPHAGOGASTRODUODENOSCOPY BIOPSY performed by Glyn Ade, MD at [facility] ENDOSCOPY    UPPER GASTROINTESTINAL ENDOSCOPY N/A 01/18/2023    ESOPHAGOGASTRODUODENOSCOPY performed by Glyn Ade, MD at [facility] ENDOSCOPY    UPPER GASTROINTESTINAL ENDOSCOPY N/A 05/13/2023    ESOPHAGOGASTRODUODENOSCOPY performed by Leta Speller, MD at [facility] ENDOSCOPY    UPPER GASTROINTESTINAL ENDOSCOPY N/A 05/13/2023    ESOPHAGOGASTRODUODENOSCOPY BIOPSY performed by Leta Speller, MD at [facility] ENDOSCOPY    UPPER GASTROINTESTINAL ENDOSCOPY N/A 09/21/2023    ESOPHAGOGASTRODUODENOSCOPY performed by Glyn Ade, MD at [facility] ENDOSCOPY    UPPER GASTROINTESTINAL ENDOSCOPY N/A 09/21/2023    ESOPHAGOGASTRODUODENOSCOPY BIOPSY performed by Glyn Ade, MD at [facility] ENDOSCOPY    [place] ABSCESS DRAINAGE PERITONEAL/RETROPERITONEAL PERC  02/11/2023    [place] ABSCESS DRAINAGE PERITONEAL 02/11/2023 [facility] [place]       Social History     Tobacco Use    Smoking status: Never    Smokeless tobacco: Never   Substance Use Topics    Alcohol use: Never        No family history on file.     No Known Allergies         Home Medications:  Prior to Admission Medications   Prescriptions Last Dose Informant Patient Reported? Taking?   DULoxetine (CYMBALTA) 60 MG extended release capsule   No No   Sig: Take 1 capsule by mouth daily   amLODIPine (NORVASC) 10 MG tablet   No No   Sig: Take 1 tablet by mouth daily   amitriptyline (ELAVIL) 10 MG tablet   No No    Sig: Take 1 tablet by mouth nightly   aspirin 81 MG chewable tablet   No No   Sig: Take 1 tablet by mouth daily   busPIRone (BUSPAR) 15 MG tablet   No No   Sig: Take 15 mg by mouth 3 times daily   cloNIDine (CATAPRES) 0.2 MG/24HR PTWK   No No   Sig: Place 1 patch onto the skin once a week for 7 days   clopidogrel (PLAVIX) 75 MG tablet   No No   Sig: Take 1 tablet  by mouth daily   dicyclomine (BENTYL) 20 MG tablet   No No   Sig: Take 1 tablet by mouth every 6 hours as needed (abdominal cramping)   linaclotide (LINZESS) 290 MCG CAPS capsule   No No   Sig: Take 1 capsule by mouth every morning (before breakfast)   lisinopril (PRINIVIL;ZESTRIL) 40 MG tablet   No No   Sig: Take 1 tablet by mouth daily   metFORMIN (GLUCOPHAGE-XR) 500 MG extended release tablet   No No   Sig: Take 2 tablets by mouth daily (with breakfast)   metoclopramide (REGLAN) 10 MG tablet   No No   Sig: Take 1 tablet by mouth 4 times daily as needed (nausea, vomiting)   metoprolol succinate (TOPROL XL) 50 MG extended release tablet   No No   Sig: Take 1 tablet by mouth daily   ondansetron (ZOFRAN-ODT) 4 MG disintegrating tablet   No No   Sig: Take 1 tablet by mouth every 8 hours as needed for Nausea or Vomiting   pantoprazole (PROTONIX) 40 MG tablet   No No   Sig: Take 1 tablet by mouth 2 times daily (before meals)   polyethylene glycol (GLYCOLAX) 17 GM/SCOOP powder   No No   Sig: Take 17 g by mouth 2 times daily   prochlorperazine (COMPAZINE) 10 MG tablet   No No   Sig: Take 1 tablet by mouth every 6 hours as needed (nausea or vomiting)      Facility-Administered Medications: None       Hospital Medications:  Current Facility-Administered Medications   Medication Dose Route Frequency    amLODIPine (NORVASC) tablet 10 mg  10 mg Oral Daily    metoprolol succinate (TOPROL XL) extended release tablet 50 mg  50 mg Oral Daily    amitriptyline (ELAVIL) tablet 10 mg  10 mg Oral Nightly    aspirin chewable tablet 81 mg  81 mg Oral Daily    busPIRone  (BUSPAR) tablet 15 mg  15 mg Oral TID    clopidogrel (PLAVIX) tablet 75 mg  75 mg Oral Daily    dicyclomine (BENTYL) tablet 20 mg  20 mg Oral Q6H PRN    DULoxetine (CYMBALTA) extended release capsule 60 mg  60 mg Oral Daily    pantoprazole (PROTONIX) tablet 40 mg  40 mg Oral BID AC    hydrALAZINE (APRESOLINE) injection 10 mg  10 mg IntraVENous Q6H PRN    sodium chloride flush 0.9 % injection 5-40 mL  5-40 mL IntraVENous 2 times per day    sodium chloride flush 0.9 % injection 5-40 mL  5-40 mL IntraVENous PRN    0.9 % sodium chloride infusion   IntraVENous PRN    acetaminophen (TYLENOL) tablet 650 mg  650 mg Oral Q6H PRN    Or    acetaminophen (TYLENOL) suppository 650 mg  650 mg Rectal Q6H PRN    0.9 % sodium chloride infusion   IntraVENous Continuous    potassium chloride (KLOR-CON) extended release tablet 40 mEq  40 mEq Oral PRN    Or    potassium bicarb-citric acid (EFFER-K) effervescent tablet 40 mEq  40 mEq Oral PRN    Or    potassium chloride 10 mEq/100 mL IVPB (Peripheral Line)  10 mEq IntraVENous PRN    magnesium sulfate 2000 mg in 50 mL IVPB premix  2,000 mg IntraVENous PRN    melatonin tablet 6 mg  6 mg Oral Nightly PRN    traZODone (DESYREL) tablet 50  mg  50 mg Oral Nightly PRN    insulin glargine (LANTUS) injection vial 10 Units  10 Units SubCUTAneous Daily    insulin lispro (HUMALOG,ADMELOG) injection vial 0-4 Units  0-4 Units SubCUTAneous 4x Daily AC & HS    metoclopramide (REGLAN) injection 5 mg  5 mg IntraVENous Q6H PRN    prochlorperazine (COMPAZINE) injection 10 mg  10 mg IntraVENous Q6H PRN    traMADol (ULTRAM) tablet 50 mg  50 mg Oral Q6H PRN    HYDROmorphone (DILAUDID) injection 0.25 mg  0.25 mg IntraVENous Q3H PRN    Or    HYDROmorphone (DILAUDID) injection 0.5 mg  0.5 mg IntraVENous Q3H PRN    acetaminophen (TYLENOL) tablet 650 mg  650 mg Oral Q4H PRN    glucose chewable tablet 16 g  4 tablet Oral PRN    dextrose bolus 10% 125 mL  125 mL IntraVENous PRN    Or    dextrose bolus 10% 250 mL  250  mL IntraVENous PRN    glucagon injection 1 mg  1 mg SubCUTAneous PRN    dextrose 10 % infusion   IntraVENous Continuous PRN     Current Outpatient Medications   Medication Sig    cloNIDine (CATAPRES) 0.2 MG/24HR PTWK Place 1 patch onto the skin once a week for 7 days    prochlorperazine (COMPAZINE) 10 MG tablet Take 1 tablet by mouth every 6 hours as needed (nausea or vomiting)    metoclopramide (REGLAN) 10 MG tablet Take 1 tablet by mouth 4 times daily as needed (nausea, vomiting)    metFORMIN (GLUCOPHAGE-XR) 500 MG extended release tablet Take 2 tablets by mouth daily (with breakfast)    busPIRone (BUSPAR) 15 MG tablet Take 15 mg by mouth 3 times daily    DULoxetine (CYMBALTA) 60 MG extended release capsule Take 1 capsule by mouth daily    clopidogrel (PLAVIX) 75 MG tablet Take 1 tablet by mouth daily    aspirin 81 MG chewable tablet Take 1 tablet by mouth daily    linaclotide (LINZESS) 290 MCG CAPS capsule Take 1 capsule by mouth every morning (before breakfast)    amitriptyline (ELAVIL) 10 MG tablet Take 1 tablet by mouth nightly    polyethylene glycol (GLYCOLAX) 17 GM/SCOOP powder Take 17 g by mouth 2 times daily    dicyclomine (BENTYL) 20 MG tablet Take 1 tablet by mouth every 6 hours as needed (abdominal cramping)    ondansetron (ZOFRAN-ODT) 4 MG disintegrating tablet Take 1 tablet by mouth every 8 hours as needed for Nausea or Vomiting    pantoprazole (PROTONIX) 40 MG tablet Take 1 tablet by mouth 2 times daily (before meals)    lisinopril (PRINIVIL;ZESTRIL) 40 MG tablet Take 1 tablet by mouth daily    metoprolol succinate (TOPROL XL) 50 MG extended release tablet Take 1 tablet by mouth daily    amLODIPine (NORVASC) 10 MG tablet Take 1 tablet by mouth daily       Review of Systems: Admission ROS by Everardo All, DO from 10/31/2023 were reviewed with the patient and changes (other than per HPI) include: none      Objective:     Physical Exam:  Vitals:    11/01/23 1335   BP:    Pulse:    Resp: 20   Temp:     SpO2:      SpO2 Readings from Last 6 Encounters:   11/01/23 98%   10/25/23 96%   10/14/23 98%   09/22/23 98%  09/14/23 100%   08/31/23 100%          Intake/Output Summary (Last 24 hours) at 11/01/2023 1356  Last data filed at 10/31/2023 2135  Gross per 24 hour   Intake 1000 ml   Output --   Net 1000 ml        General: alert and no distress  Head: Normocephalic, without obvious abnormality, atraumatic  Eyes: anicteric sclerae and conjuntiva clear  Lungs: normal respiratory effort  Ext: no cyanosis and no edema  Skin: normal skin color, no rashes, and texture normal  Neuro:  Alert and oriented  Psych: not anxious, cooperative, appropriate affect     Laboratory:    Recent Results (from the past 24 hour(s))   CBC with Auto Differential    Collection Time: 10/31/23  8:10 PM   Result Value Ref Range    WBC 11.4 (H) 3.6 - 11.0 K/uL    RBC 3.56 (L) 3.80 - 5.20 M/uL    Hemoglobin 10.2 (L) 11.5 - 16.0 g/dL    Hematocrit 16.1 (L) 35.0 - 47.0 %    MCV 83.7 80.0 - 99.0 FL    MCH 28.7 26.0 - 34.0 PG    MCHC 34.2 30.0 - 36.5 g/dL    RDW 09.6 04.5 - 40.9 %    Platelets 586 (H) 150 - 400 K/uL    MPV 8.7 (L) 8.9 - 12.9 FL    Nucleated RBCs 0.0 0 PER 100 WBC    nRBC 0.00 0.00 - 0.01 K/uL    Neutrophils % 76.1 (H) 32.0 - 75.0 %    Lymphocytes % 15.3 12.0 - 49.0 %    Monocytes % 6.4 5.0 - 13.0 %    Eosinophils % 1.3 0.0 - 7.0 %    Basophils % 0.6 0.0 - 1.0 %    Immature Granulocytes % 0.3 0.0 - 0.5 %    Neutrophils Absolute 8.70 (H) 1.80 - 8.00 K/UL    Lymphocytes Absolute 1.75 0.80 - 3.50 K/UL    Monocytes Absolute 0.73 0.00 - 1.00 K/UL    Eosinophils Absolute 0.15 0.00 - 0.40 K/UL    Basophils Absolute 0.07 0.00 - 0.10 K/UL    Immature Granulocytes Absolute 0.03 0.00 - 0.04 K/UL    Differential Type AUTOMATED     Comprehensive Metabolic Panel    Collection Time: 10/31/23  8:10 PM   Result Value Ref Range    Sodium 140 136 - 145 mmol/L    Potassium 3.6 3.5 - 5.1 mmol/L    Chloride 104 97 - 108 mmol/L    CO2 28 21 - 32 mmol/L    Anion  Gap 8 2 - 12 mmol/L    Glucose 212 (H) 65 - 100 mg/dL    BUN 27 (H) 6 - 20 MG/DL    Creatinine 8.11 (H) 0.55 - 1.02 MG/DL    BUN/Creatinine Ratio 12 12 - 20      Est, Glom Filt Rate 28 (L) >60 ml/min/1.20m2    Calcium 9.2 8.5 - 10.1 MG/DL    Total Bilirubin 0.4 0.2 - 1.0 MG/DL    ALT 58 12 - 78 U/L    AST 36 15 - 37 U/L    Alk Phosphatase 137 (H) 45 - 117 U/L    Total Protein 7.1 6.4 - 8.2 g/dL    Albumin 2.7 (L) 3.5 - 5.0 g/dL    Globulin 4.4 (H) 2.0 - 4.0 g/dL    Albumin/Globulin Ratio 0.6 (L) 1.1 - 2.2  Lipase    Collection Time: 10/31/23  8:10 PM   Result Value Ref Range    Lipase 18 13 - 75 U/L   Urine Drug Screen    Collection Time: 10/31/23 11:28 PM   Result Value Ref Range    Amphetamine, Urine Negative NEG      Barbiturates, Urine Negative NEG      Benzodiazepines, Urine Negative NEG      Cocaine, Urine Negative NEG      Methadone, Urine Negative NEG      Opiates, Urine Negative NEG      Phencyclidine, Urine Negative NEG      THC, TH-Cannabinol, Urine Positive (A) NEG      Comments: (NOTE)    Vitamin D 25 Hydroxy    Collection Time: 10/31/23 11:28 PM   Result Value Ref Range    Vit D, 25-Hydroxy <9.0 (L) 30 - 100 ng/mL   Gamma GT    Collection Time: 10/31/23 11:28 PM   Result Value Ref Range    GGT 136 (H) 5 - 55 U/L   Iron and TIBC    Collection Time: 10/31/23 11:28 PM   Result Value Ref Range    Iron 64 35 - 150 ug/dL    TIBC 161 (L) 096 - 045 ug/dL    Iron % Saturation 33 20 - 50 %   Ferritin    Collection Time: 10/31/23 11:28 PM   Result Value Ref Range    Ferritin 175 26 - 388 NG/ML   Vitamin B12 & Folate    Collection Time: 10/31/23 11:28 PM   Result Value Ref Range    Vitamin B-12 >2000 (H) 193 - 986 pg/mL    Folate 17.5 5.0 - 21.0 ng/mL   Basic Metabolic Panel    Collection Time: 11/01/23  4:44 AM   Result Value Ref Range    Sodium 141 136 - 145 mmol/L    Potassium 3.8 3.5 - 5.1 mmol/L    Chloride 108 97 - 108 mmol/L    CO2 30 21 - 32 mmol/L    Anion Gap 3 2 - 12 mmol/L    Glucose 196 (H) 65 -  100 mg/dL    BUN 26 (H) 6 - 20 MG/DL    Creatinine 4.09 (H) 0.55 - 1.02 MG/DL    BUN/Creatinine Ratio 13 12 - 20      Est, Glom Filt Rate 31 (L) >60 ml/min/1.6m2    Calcium 8.5 8.5 - 10.1 MG/DL   CBC    Collection Time: 11/01/23  4:44 AM   Result Value Ref Range    WBC 11.4 (H) 3.6 - 11.0 K/uL    RBC 3.13 (L) 3.80 - 5.20 M/uL    Hemoglobin 9.2 (L) 11.5 - 16.0 g/dL    Hematocrit 81.1 (L) 35.0 - 47.0 %    MCV 86.9 80.0 - 99.0 FL    MCH 29.4 26.0 - 34.0 PG    MCHC 33.8 30.0 - 36.5 g/dL    RDW 91.4 78.2 - 95.6 %    Platelets 459 (H) 150 - 400 K/uL    MPV 8.4 (L) 8.9 - 12.9 FL    Nucleated RBCs 0.0 0 PER 100 WBC    nRBC 0.00 0.00 - 0.01 K/uL   Magnesium    Collection Time: 11/01/23  4:44 AM   Result Value Ref Range    Magnesium 2.0 1.6 - 2.4 mg/dL   Prealbumin    Collection Time: 11/01/23  4:44 AM  Result Value Ref Range    Prealbumin 27.6 20.0 - 40.0 mg/dL   POCT Glucose    Collection Time: 11/01/23  7:34 AM   Result Value Ref Range    POC Glucose 201 (H) 65 - 117 mg/dL    Performed by: Rebbeca Paul RN    Urinalysis with Microscopic    Collection Time: 11/01/23 11:09 AM   Result Value Ref Range    Color, UA YELLOW/STRAW      Appearance CLEAR CLEAR      Specific Gravity, UA 1.016 1.003 - 1.030      pH, Urine 7.5 5.0 - 8.0      Protein, UA >300 (A) NEG mg/dL    Glucose, Ur 324 (A) NEG mg/dL    Ketones, Urine Negative NEG mg/dL    Bilirubin, Urine Negative NEG      Blood, Urine Negative NEG      Urobilinogen, Urine 0.2 0.2 - 1.0 EU/dL    Nitrite, Urine Negative NEG      Leukocyte Esterase, Urine Negative NEG      WBC, UA 0-4 0 - 4 /hpf    RBC, UA 0-5 0 - 5 /hpf    Epithelial Cells, UA FEW FEW /lpf    BACTERIA, URINE 1+ (A) NEG /hpf    Hyaline Casts, UA 0-2 0 - 5 /lpf    Granular Casts, UA 0-2 (A) NEG /lpf   Urine Culture Hold Sample    Collection Time: 11/01/23 11:09 AM    Specimen: Urine   Result Value Ref Range    Specimen HOld        Urine on hold in Microbiology dept for 2 days.  If unpreserved urine is submitted,  it cannot be used for addtional testing after 24 hours, recollection will be required.   POCT Glucose    Collection Time: 11/01/23 11:36 AM   Result Value Ref Range    POC Glucose 130 (H) 65 - 117 mg/dL    Performed by: Salvadore Dom          Assessment/Plan:     Principal Problem:    Intractable nausea and vomiting  Resolved Problems:    * No resolved hospital problems. *       See above narrative for full detail.

## 2023-11-01 NOTE — ED Notes (Signed)
Bedside shift change report completed with Shanda Bumps, RN to include sbar, plan of care, admit status, vital signs reviewed, pt in nad, vss, sitting up eyes closed, lights off for comfort, answers questions appropriately

## 2023-11-01 NOTE — ED Notes (Signed)
Pt up ambulatory to bathroom next to her room with a nurse

## 2023-11-01 NOTE — Plan of Care (Signed)
Problem: Pain  Goal: Verbalizes/displays adequate comfort level or baseline comfort level  Outcome: Progressing     Problem: Skin/Tissue Integrity  Goal: Absence of new skin breakdown  Description: 1.  Monitor for areas of redness and/or skin breakdown  2.  Assess vascular access sites hourly  3.  Every 4-6 hours minimum:  Change oxygen saturation probe site  4.  Every 4-6 hours:  If on nasal continuous positive airway pressure, respiratory therapy assess nares and determine need for appliance change or resting period.  Outcome: Progressing     Problem: Safety - Adult  Goal: Free from fall injury  Outcome: Progressing     Problem: Chronic Conditions and Co-morbidities  Goal: Patient's chronic conditions and co-morbidity symptoms are monitored and maintained or improved  Outcome: Progressing

## 2023-11-01 NOTE — Consults (Signed)
Session ID: [id number]  Language: Spanish  Interpreter ID: [phone]  Interpreter Name: Alonna Minium

## 2023-11-01 NOTE — ED Notes (Signed)
Pt up ambulatory to bathroom with nurse

## 2023-11-01 NOTE — Progress Notes (Signed)
Pharmacist renally adjusted metoclopramide to 5 mg IV every 6 hours prn per approved protocol.    Thank you,      Shawnie Pons, PharmD, BCPS

## 2023-11-01 NOTE — Progress Notes (Signed)
[facility]                                                                                          Hospitalist Progress Note  Ria Comment, MD  Office Phone: [phone]        Date of Service:  11/01/2023  NAME:  Melinda [name]  DOB:  04/29/1981  MRN:  [id number]       Admission HPI:   "Melinda [name] is a 43 y.o. female  with a medical history significant for DM, gastroparesis, CKD, HTN, HLD , who presents to the ER with abdominal pain, nausea and vomiting.  Started 2 days ago and has worsened since onset.  Describes abdominal pain as a deep strong generalized pain. Associated with fatigue, chills, generalized weakness, decreased urination, pain with urination.  Denies chest pain, palpitations, dyspnea, fever, body aches, chills, headaches, diaphoresis, dark and/or bloody stools, diarrhea, lower extremity edema.  Patient has been compliant with all her medications since discharge last week.  Tried home pain medication at home.  Was able to help some.  Outpatient follow-ups for PCP and GI are scheduled for next week     During her ER evaluation, patient was hemodynamically stable, however patient was tachycardic, tachypneic with elevated blood pressures. Labs were significant for elevated creatinine, elevated glucose, decreased albumin, elevated alk phos, slightly elevated WBC, decreased hemoglobin, elevated platelets.  Patient was given Dilaudid 1 mg IV x 1, morphine 4 mg IV x 1, Reglan 10 mg IV x 1, NS 0.9% 1 L bolus x 1 in the ER.  Patient will be admitted for further evaluation and management for her ongoing AKI related to her intractable nausea and vomiting."       Interval history / Subjective:   1/20: No acute issues since admission. Feeling a little better today, but with persistent mild pain and nausea. Has tolerated a little PO intake with jello, but not much otherwise.      Assessment & Plan:     1. Intractable N/V: Likely secondary to gastroparesis flare. Less  likely hyperemesis from cannabis use despite positive UDS screen, although may contribute.   - UA checked and unremarkable, not a source of pain  - Clears, advance as tolerated  - GI consulted- discussed with Melinda Baron, PA  - Supportive care with compazine and reglan  - IV fluids until PO intake improves  - Porphyria workup being completed by GI  - Recommend THC cessation  - Topical capsaicin as this may help for THC hyperemesis  - Continue amitriptyline  - Continue BID PPI    2. AKI: admission Creatinine 2.20.  Notably above baseline of approx 1.5 to 1.7. Likely secondary to IVVD in the setting of intractable nausea and vomiting.   -Strict I's and O's  -IV fluids  -Hold metformin and lisinopril  -Avoid nephrotoxic medications when possible  -Renally dose medications accordingly  -Monitor creatinine with a.m. labs  -To consider renal ultrasound and nephrology consult if AKI does not improve with current interventions    3. Anemia, normocytic, no acute blood loss. Hemoglobin 10.2.  Appears  to be above baseline. False elevation may be due to hemoconcentration in the setting of dehydration from intractable nausea vomiting  -Denies dark and/or bloody stool  -Anemia studies ordered  -Monitor with a.m. labs  -Transfuse if hemoglobin less than 7    4. Type 2 diabetes, hyperglycemia: 10/24/2023 Hemoglobin A1c 6.8  -Hold home metformin  - Continue Lantus 10 units daily with low-dose ISS and Accu-Chek q. ACHS.    5. HTN:   - Cont norvasc, metoprolol  - Holding lisinopril for AKI    6. Mood disorder:   - Continue cymbalta and amitriptyline    7. 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 Most recent are:  Vitals:    11/01/23 0700   BP: (!) 166/86   Pulse: (!) 103   Resp: 13    Temp:    SpO2: 98%         Intake/Output Summary (Last 24 hours) at 11/01/2023 4132  Last data filed at 10/31/2023 2135  Gross per 24 hour   Intake 1000 ml   Output --   Net 1000 ml        Physical Examination:     I had a face to face encounter with this patient and independently examined them on 11/01/2023 as outlined below:        General : alert and awake, no acute distress  HEENT: moist mucus membranes  Chest: CTAB, normal WOB  CVS: S1 and S2 heard, no m/g/r  Abd: non distended, normoactive bowel sounds, mild midepigastric ttp  Ext: WWP, no edema  Neuro/Psych: no focal deficits  Skin: no rash      Data Review:     I have personally and independently reviewed all pertinent labs, diagnostic studies, imaging, and have provided independent interpretation of the same.     Labs and imaging:     Recent Labs     10/31/23  2010 11/01/23  0444   WBC 11.4* 11.4*   HGB 10.2* 9.2*   HCT 29.8* 27.2*   PLT 586* 459*     Recent Labs     10/31/23  2010 11/01/23  0444   NA 140 141   K 3.6 3.8   CL 104 108   CO2 28 30   BUN 27* 26*   MG  --  2.0     Recent Labs     10/31/23  2010   ALT 58   GLOB 4.4*     No results for input(s): "INR", "APTT" in the last 72 hours.    Invalid input(s): "PTP"   No results for input(s): "TIBC" in the last 72 hours.    Invalid input(s): "FE", "PSAT", "FERR"   No results found for: "RBCF"   No results for input(s): "PH", "PCO2", "PO2" in the last 72 hours.  No results for input(s): "CPK" in the last 72 hours.    Invalid input(s): "CPKMB", "CKNDX", "TROIQ"  Lab Results   Component Value Date/Time    CHOL 341 09/01/2023 10:00 AM    HDL 78 09/01/2023 10:00 AM    LDL 193.8 09/01/2023 10:00 AM    LDL Not calculated due to elevated  triglyceride level 09/16/2022 10:30 AM     No results found for: "GLUCPOC"  @LABUA @      No orders to display       09/17/23    ECHO (TTE) COMPLETE (PRN CONTRAST/BUBBLE/STRAIN/3D) 09/22/2023  9:33 PM (Final)    Interpretation Summary    Left Ventricle: Normal left ventricular  systolic function. EF by 2D Simpsons Biplane is 59%. Left ventricle size is normal. Findings consistent with moderate concentric hypertrophy. Normal wall motion. Diastolic dysfunction present with normal LV EF.    Tricuspid Valve: Mild regurgitation.    Image quality is adequate.    Signed by: Victorino December, MD on 09/22/2023  9:33 PM        ______________________________________________________________________                 Ria Comment, MD

## 2023-11-02 LAB — COMPREHENSIVE METABOLIC PANEL
ALT: 90 U/L — ABNORMAL HIGH (ref 12–78)
AST: 95 U/L — ABNORMAL HIGH (ref 15–37)
Albumin/Globulin Ratio: 0.6 — ABNORMAL LOW (ref 1.1–2.2)
Albumin: 2 g/dL — ABNORMAL LOW (ref 3.5–5.0)
Alk Phosphatase: 184 U/L — ABNORMAL HIGH (ref 45–117)
Anion Gap: 6 mmol/L (ref 2–12)
BUN/Creatinine Ratio: 12 (ref 12–20)
BUN: 22 mg/dL — ABNORMAL HIGH (ref 6–20)
CO2: 24 mmol/L (ref 21–32)
Calcium: 7.9 mg/dL — ABNORMAL LOW (ref 8.5–10.1)
Chloride: 110 mmol/L — ABNORMAL HIGH (ref 97–108)
Creatinine: 1.86 mg/dL — ABNORMAL HIGH (ref 0.55–1.02)
Est, Glom Filt Rate: 34 mL/min/{1.73_m2} — ABNORMAL LOW (ref >60)
Globulin: 3.1 g/dL (ref 2.0–4.0)
Glucose: 123 mg/dL — ABNORMAL HIGH (ref 65–100)
Potassium: 3.4 mmol/L — ABNORMAL LOW (ref 3.5–5.1)
Sodium: 140 mmol/L (ref 136–145)
Total Bilirubin: 0.2 mg/dL (ref 0.2–1.0)
Total Protein: 5.1 g/dL — ABNORMAL LOW (ref 6.4–8.2)

## 2023-11-02 LAB — CBC WITH AUTO DIFFERENTIAL
Basophils %: 0.8 % (ref 0.0–1.0)
Basophils Absolute: 0.05 10*3/uL (ref 0.00–0.10)
Eosinophils %: 2.1 % (ref 0.0–7.0)
Eosinophils Absolute: 0.14 10*3/uL (ref 0.00–0.40)
Hematocrit: 26.3 % — ABNORMAL LOW (ref 35.0–47.0)
Hemoglobin: 8.5 g/dL — ABNORMAL LOW (ref 11.5–16.0)
Immature Granulocytes %: 0.3 % (ref 0.0–0.5)
Immature Granulocytes Absolute: 0.02 10*3/uL (ref 0.00–0.04)
Lymphocytes %: 23.9 % (ref 12.0–49.0)
Lymphocytes Absolute: 1.56 10*3/uL (ref 0.80–3.50)
MCH: 29 pg (ref 26.0–34.0)
MCHC: 32.3 g/dL (ref 30.0–36.5)
MCV: 89.8 fL (ref 80.0–99.0)
MPV: 8.3 fL — ABNORMAL LOW (ref 8.9–12.9)
Monocytes %: 9.2 % (ref 5.0–13.0)
Monocytes Absolute: 0.6 10*3/uL (ref 0.00–1.00)
Neutrophils %: 63.7 % (ref 32.0–75.0)
Neutrophils Absolute: 4.16 10*3/uL (ref 1.80–8.00)
Nucleated RBCs: 0 /100{WBCs}
Platelets: 447 10*3/uL — ABNORMAL HIGH (ref 150–400)
RBC: 2.93 M/uL — ABNORMAL LOW (ref 3.80–5.20)
RDW: 13.9 % (ref 11.5–14.5)
WBC: 6.5 10*3/uL (ref 3.6–11.0)
nRBC: 0 10*3/uL (ref 0.00–0.01)

## 2023-11-02 LAB — POCT GLUCOSE
POC Glucose: 259 mg/dL — ABNORMAL HIGH (ref 65–117)
POC Glucose: 158 mg/dL — ABNORMAL HIGH (ref 65–117)
POC Glucose: 163 mg/dL — ABNORMAL HIGH (ref 65–117)
POC Glucose: 120 mg/dL — ABNORMAL HIGH (ref 65–117)

## 2023-11-02 LAB — SEDIMENTATION RATE: Sed Rate, Automated: 68 mm/h — ABNORMAL HIGH (ref 0–20)

## 2023-11-02 LAB — C-REACTIVE PROTEIN: CRP: <0.29 mg/dL (ref 0.00–0.30)

## 2023-11-02 MED ORDER — SODIUM CHLORIDE 0.9 % IV SOLN
0.9 % | INTRAVENOUS | Status: AC
Start: 2023-11-02 — End: 2023-11-05
  Administered 2023-11-02 – 2023-11-05 (×4): via INTRAVENOUS

## 2023-11-02 MED ORDER — HYDROMORPHONE HCL PF 1 MG/ML IJ SOLN
1 | Freq: Once | INTRAMUSCULAR | Status: AC
Start: 2023-11-02 — End: 2023-11-02
  Administered 2023-11-02: 23:00:00 1 mg via INTRAVENOUS

## 2023-11-02 MED FILL — PANTOPRAZOLE SODIUM 40 MG PO TBEC: 40 MG | ORAL | Qty: 1

## 2023-11-02 MED FILL — TRAMADOL HCL 50 MG PO TABS: 50 MG | ORAL | Qty: 1

## 2023-11-02 MED FILL — HYDROMORPHONE HCL 1 MG/ML IJ SOLN: 1 MG/ML | INTRAMUSCULAR | Qty: 0.5

## 2023-11-02 MED FILL — AMLODIPINE BESYLATE 5 MG PO TABS: 5 MG | ORAL | Qty: 2

## 2023-11-02 MED FILL — BUSPIRONE HCL 15 MG PO TABS: 15 MG | ORAL | Qty: 1

## 2023-11-02 MED FILL — INSULIN LISPRO 100 UNIT/ML IJ SOLN: 100 UNIT/ML | INTRAMUSCULAR | Qty: 2

## 2023-11-02 MED FILL — LANTUS 100 UNIT/ML SC SOLN: 100 UNIT/ML | SUBCUTANEOUS | Qty: 10

## 2023-11-02 MED FILL — ASPIRIN LOW DOSE 81 MG PO CHEW: 81 MG | ORAL | Qty: 1

## 2023-11-02 MED FILL — DULOXETINE HCL 30 MG PO CPEP: 30 MG | ORAL | Qty: 2

## 2023-11-02 MED FILL — PROCHLORPERAZINE EDISYLATE 10 MG/2ML IJ SOLN: 10 MG/2ML | INTRAMUSCULAR | Qty: 2

## 2023-11-02 MED FILL — HYDRALAZINE HCL 20 MG/ML IJ SOLN: 20 MG/ML | INTRAMUSCULAR | Qty: 1

## 2023-11-02 MED FILL — CLOPIDOGREL BISULFATE 75 MG PO TABS: 75 MG | ORAL | Qty: 1

## 2023-11-02 MED FILL — METOPROLOL SUCCINATE ER 50 MG PO TB24: 50 MG | ORAL | Qty: 1

## 2023-11-02 MED FILL — HYDROMORPHONE HCL 1 MG/ML IJ SOLN: 1 MG/ML | INTRAMUSCULAR | Qty: 1

## 2023-11-02 MED FILL — SODIUM CHLORIDE 0.9 % IV SOLN: 0.9 % | INTRAVENOUS | Qty: 1000

## 2023-11-02 MED FILL — AMITRIPTYLINE HCL 10 MG PO TABS: 10 MG | ORAL | Qty: 1

## 2023-11-02 NOTE — Progress Notes (Addendum)
Patient/caregivers speak Spanish  as their preferred language for their healthcare communication. For safe communication, use the [employer] interpreter carts or call:    Senior Interpreter/Navigator Jason Fila at [phone] or   [employer] phone services for [place] at [phone]    General phone: [phone] ( [phone])  Email: [email or web address]    Always document the use of interpreting services (Interpreter's ID number) in your clinical notes.    Our interpreters are available for team members working with limited  [place] proficient (LEP) patients remotely, via phone or video or in person (if needed for special cases).    When using family members to interpret, for the safety of the patient and protection of the communication of both our patient and [facility] staff the VRI or telephonic interpreter should remain on the line to monitor that all communication is accurate and complete. The interpreter should be instructed to notify [facility] staff immediately if there are any inaccuracies.         Thank you,        Jason Fila CHITM  Senior Interpreter/Navigator

## 2023-11-02 NOTE — Progress Notes (Signed)
Melinda Bright, PA-C                       [phone] office             Monday-Friday 8:00 am-4:30 pm  I am not permitted to use "perfect serve" use above for contact, thanks.        Gastroenterology Progress Note    November 02, 2023  Admit Date: 10/31/2023         Narrative Assessment and Plan   43 y.o. female being followed by GI for abdominal pain, nausea, vomiting.  Workup in process including C1 esterase inhibitor, C4 complement, porphyrins plasma, ESR.  ESR elevated at 68, was elevated at 61 in June 2024.  CRP negative.  LFTs mildly elevated today with alkaline phosphatase 24, ALT 90, AST 95.  Of note total bilirubin is normal at 0.2.  S/p cholecystectomy.    Impression:  Recurrent nausea and vomiting  Abdominal pain    Plan:  Continue with supportive care including rehydration, antiemetics.  Continue pantoprazole 40 mg twice daily.  Continue amitriptyline.  Continue trending LFTs daily.    Melinda Harder, PA-C  Discussed with [name], above is our plan. MDM hers.  [name], MD     Subjective:   Chief Complaint: Follow-up abdominal pain, nausea, and vomiting    HPI: Patient endorsing generalized abdominal pain, but denies nausea or vomiting at present.    ROS:  The previous review of systems on initial consultation / H&P is noted and reviewed.  Specific changes noted above in HPI.    Current Medications:     Current Facility-Administered Medications   Medication Dose Route Frequency    0.9 % sodium chloride infusion   IntraVENous Continuous    amLODIPine (NORVASC) tablet 10 mg  10 mg Oral Daily    metoprolol succinate (TOPROL XL) extended release tablet 50 mg  50 mg Oral Daily    amitriptyline (ELAVIL) tablet 10 mg  10 mg Oral Nightly    aspirin chewable tablet 81 mg  81 mg Oral Daily    busPIRone (BUSPAR) tablet 15 mg  15 mg Oral TID    clopidogrel (PLAVIX) tablet 75 mg  75 mg Oral Daily     dicyclomine (BENTYL) tablet 20 mg  20 mg Oral Q6H PRN    DULoxetine (CYMBALTA) extended release capsule 60 mg  60 mg Oral Daily    pantoprazole (PROTONIX) tablet 40 mg  40 mg Oral BID AC    sodium chloride flush 0.9 % injection 5-40 mL  5-40 mL IntraVENous 2 times per day    sodium chloride flush 0.9 % injection 5-40 mL  5-40 mL IntraVENous PRN    0.9 % sodium chloride infusion   IntraVENous PRN    acetaminophen (TYLENOL) tablet 650 mg  650 mg Oral Q6H PRN    Or    acetaminophen (TYLENOL) suppository 650 mg  650 mg Rectal Q6H PRN    potassium chloride (KLOR-CON) extended release tablet 40 mEq  40 mEq Oral PRN    Or    potassium bicarb-citric acid (EFFER-K) effervescent tablet 40 mEq  40 mEq Oral PRN    Or    potassium chloride 10 mEq/100 mL IVPB (Peripheral Line)  10 mEq IntraVENous PRN    magnesium sulfate 2000 mg in 50 mL IVPB premix  2,000 mg IntraVENous PRN    melatonin tablet 6 mg  6 mg Oral Nightly PRN    traZODone (  DESYREL) tablet 50 mg  50 mg Oral Nightly PRN    insulin glargine (LANTUS) injection vial 10 Units  10 Units SubCUTAneous Daily    insulin lispro (HUMALOG,ADMELOG) injection vial 0-4 Units  0-4 Units SubCUTAneous 4x Daily AC & HS    metoclopramide (REGLAN) injection 5 mg  5 mg IntraVENous Q6H PRN    capsaicin (ZOSTRIX) 0.025 % cream   Topical BID    hydrALAZINE (APRESOLINE) injection 10 mg  10 mg IntraVENous Q6H PRN    prochlorperazine (COMPAZINE) injection 10 mg  10 mg IntraVENous Q6H PRN    traMADol (ULTRAM) tablet 50 mg  50 mg Oral Q6H PRN    HYDROmorphone (DILAUDID) injection 0.25 mg  0.25 mg IntraVENous Q3H PRN    Or    HYDROmorphone (DILAUDID) injection 0.5 mg  0.5 mg IntraVENous Q3H PRN    acetaminophen (TYLENOL) tablet 650 mg  650 mg Oral Q4H PRN    glucose chewable tablet 16 g  4 tablet Oral PRN    dextrose bolus 10% 125 mL  125 mL IntraVENous PRN    Or    dextrose bolus 10% 250 mL  250 mL IntraVENous PRN    glucagon injection 1 mg  1 mg SubCUTAneous PRN    dextrose 10 % infusion    IntraVENous Continuous PRN       Objective:     VITALS:   Last 24hrs VS reviewed since prior progress note. Most recent are:  Vitals:    11/02/23 1126   BP: 136/78   Pulse: 94   Resp: 16   Temp: 98.1 F (36.7 C)   SpO2: 98%     Temp (24hrs), Avg:97.9 F (36.6 C), Min:97.5 F (36.4 C), Max:98.2 F (36.8 C)      Intake/Output Summary (Last 24 hours) at 11/02/2023 1543  Last data filed at 11/01/2023 2011  Gross per 24 hour   Intake 300 ml   Output --   Net 300 ml       General: alert and no distress  Head: Normocephalic, without obvious abnormality, atraumatic  Eyes: anicteric sclerae and conjuntiva clear  Lungs: normal respiratory effort  Ext: no cyanosis and no edema  Skin: normal skin color, no rashes, and texture normal  Neuro:  Alert and oriented  Psych: not anxious, cooperative, appropriate affect     Lab Data Reviewed:   Recent Labs     10/31/23  2010 11/01/23  0444 11/02/23  0305   WBC 11.4* 11.4* 6.5   HGB 10.2* 9.2* 8.5*   HCT 29.8* 27.2* 26.3*   PLT 586* 459* 447*     Recent Labs     10/31/23  2010 11/01/23  0444 11/02/23  0305   NA 140 141 140   K 3.6 3.8 3.4*   CL 104 108 110*   CO2 28 30 24    BUN 27* 26* 22*   MG  --  2.0  --    ALT 58  --  90*     No results found for: "GLUCPOC"  No results for input(s): "PH", "PCO2", "PO2", "HCO3", "FIO2" in the last 72 hours.  No results for input(s): "INR" in the last 72 hours.        Assessment:   (See above)  Principal Problem:    Intractable nausea and vomiting  Resolved Problems:    * No resolved hospital problems. *      Plan:   (See above)      Signed By: Cammy Copa  Marlaine Hind, PA-C     11/02/2023  3:43 PM

## 2023-11-02 NOTE — Consults (Signed)
Session ID: [id number]  Language: Spanish  Interpreter ID: #[id number]  Interpreter Name: Leonia Reader

## 2023-11-02 NOTE — Progress Notes (Signed)
[facility]                                                                                          Hospitalist Progress Note  Ria Comment, MD  Office Phone: [phone]        Date of Service:  11/02/2023  NAME:  Lanice Folden  DOB:  02/21/1981  MRN:  [id number]       Admission HPI:   "Melinda [name] is a 43 y.o. female  with a medical history significant for DM, gastroparesis, CKD, HTN, HLD , who presents to the ER with abdominal pain, nausea and vomiting.  Started 2 days ago and has worsened since onset.  Describes abdominal pain as a deep strong generalized pain. Associated with fatigue, chills, generalized weakness, decreased urination, pain with urination.  Denies chest pain, palpitations, dyspnea, fever, body aches, chills, headaches, diaphoresis, dark and/or bloody stools, diarrhea, lower extremity edema.  Patient has been compliant with all her medications since discharge last week.  Tried home pain medication at home.  Was able to help some.  Outpatient follow-ups for PCP and GI are scheduled for next week     During her ER evaluation, patient was hemodynamically stable, however patient was tachycardic, tachypneic with elevated blood pressures. Labs were significant for elevated creatinine, elevated glucose, decreased albumin, elevated alk phos, slightly elevated WBC, decreased hemoglobin, elevated platelets.  Patient was given Dilaudid 1 mg IV x 1, morphine 4 mg IV x 1, Reglan 10 mg IV x 1, NS 0.9% 1 L bolus x 1 in the ER.  Patient will be admitted for further evaluation and management for her ongoing AKI related to her intractable nausea and vomiting."       Interval history / Subjective:   1/20: No acute issues since admission. Feeling a little better today, but with persistent mild pain and nausea. Has tolerated a little PO intake with jello, but not much otherwise.     1/21: Feeling better today. Tolerating some liquids, but still having some mild abd pain  and N/V, but feels like she is improving. Had a BM yesterday.      Assessment & Plan:     1. Intractable N/V: Likely secondary to gastroparesis flare. Less likely hyperemesis from cannabis use despite positive UDS screen, although may contribute.   - UA checked and unremarkable, not a source of pain  - Clears, advance as tolerated  - GI following  - Supportive care with compazine and reglan  - IV fluids until PO intake improves  - Porphyria workup being completed by GI  - Recommend THC cessation  - Topical capsaicin as this may help for THC hyperemesis  - Continue amitriptyline  - Continue BID PPI  - Trend LFTs, noted to elevate a little on 1/22     2. AKI: admission Creatinine 2.20.  Notably above baseline of approx 1.5 to 1.7. Likely secondary to IVVD in the setting of intractable nausea and vomiting. Improving on 1/22.   -Strict I's and O's  -IV fluids  -Hold metformin and lisinopril  -Avoid nephrotoxic medications when possible  -  Renally dose medications accordingly  -Monitor creatinine with a.m. labs  -To consider renal ultrasound and nephrology consult if AKI does not improve with current interventions     3. Anemia, normocytic, no acute blood loss. Admit hemoglobin 10.2.  Appears to be above baseline, now back to baseline around 8 on 1/22. False elevation may be due to hemoconcentration in the setting of dehydration from intractable nausea vomiting. No acute blood loss.   -Denies dark and/or bloody stool  -Anemia studies ordered  -Monitor with a.m. labs  -Transfuse if hemoglobin less than 7     4. Type 2 diabetes, hyperglycemia: 10/24/2023 Hemoglobin A1c 6.8  -Hold home metformin  - Continue Lantus 10 units daily with low-dose ISS and Accu-Chek q. ACHS.     5. HTN:   - Cont norvasc, metoprolol  - Holding lisinopril for AKI     6. Mood disorder:   - Continue cymbalta and amitriptyline     7. 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 Most recent are:  Vitals:    11/02/23 0723   BP: (!) 169/97   Pulse: 94   Resp: 16   Temp: 97.9 F (36.6 C)   SpO2: 96%         Intake/Output Summary (Last 24 hours) at 11/02/2023 1610  Last data filed at 11/01/2023 2011  Gross per 24 hour   Intake 300 ml   Output --   Net 300 ml        Physical Examination:     I had a face to face encounter with this patient and independently examined them on 11/02/2023 as outlined below:        General : alert and awake, no acute distress  HEENT: moist mucus membranes  Chest: CTAB, normal WOB  CVS: S1 and S2 heard, no m/g/r  Abd: non distended, normoactive bowel sounds, mild midepigastric ttp  Ext: WWP, no edema  Neuro/Psych: no focal deficits  Skin: no rash      Data Review:     I have personally and independently reviewed all pertinent labs, diagnostic studies, imaging, and have provided independent interpretation of the same.     Labs and imaging:     Recent Labs     11/01/23  0444 11/02/23  0305   WBC 11.4* 6.5   HGB 9.2* 8.5*   HCT 27.2* 26.3*   PLT 459* 447*     Recent Labs     10/31/23  2010 11/01/23  0444 11/02/23  0305   NA 140 141 140   K 3.6 3.8 3.4*   CL 104 108 110*   CO2 28 30 24    BUN 27* 26* 22*   MG  --  2.0  --      Recent Labs     10/31/23  2010 10/31/23  2328 11/02/23  0305   ALT 58  --  90*   GLOB 4.4*  --  3.1   GGT  --  136*  --      No results for input(s): "INR", "APTT" in the last 72 hours.    Invalid input(s): "PTP"   Recent Labs  10/31/23  2328   TIBC 195*      No results found for: "RBCF"   No results for input(s): "PH", "PCO2", "PO2" in the last 72 hours.  No results for input(s): "CPK" in the last 72 hours.    Invalid input(s): "CPKMB", "CKNDX", "TROIQ"  Lab Results   Component Value Date/Time    CHOL 341 09/01/2023 10:00 AM    HDL 78 09/01/2023 10:00 AM    LDL 193.8  09/01/2023 10:00 AM    LDL Not calculated due to elevated triglyceride level 09/16/2022 10:30 AM     No results found for: "GLUCPOC"  @LABUA @      No orders to display       09/17/23    ECHO (TTE) COMPLETE (PRN CONTRAST/BUBBLE/STRAIN/3D) 09/22/2023  9:33 PM (Final)    Interpretation Summary    Left Ventricle: Normal left ventricular systolic function. EF by 2D Simpsons Biplane is 59%. Left ventricle size is normal. Findings consistent with moderate concentric hypertrophy. Normal wall motion. Diastolic dysfunction present with normal LV EF.    Tricuspid Valve: Mild regurgitation.    Image quality is adequate.    Signed by: Victorino December, MD on 09/22/2023  9:33 PM        ______________________________________________________________________                 Ria Comment, MD

## 2023-11-03 LAB — COMPREHENSIVE METABOLIC PANEL
ALT: 69 U/L (ref 12–78)
AST: 59 U/L — ABNORMAL HIGH (ref 15–37)
Albumin/Globulin Ratio: 0.6 — ABNORMAL LOW (ref 1.1–2.2)
Albumin: 1.9 g/dL — ABNORMAL LOW (ref 3.5–5.0)
Alk Phosphatase: 156 U/L — ABNORMAL HIGH (ref 45–117)
Anion Gap: 3 mmol/L (ref 2–12)
BUN/Creatinine Ratio: 9 — ABNORMAL LOW (ref 12–20)
BUN: 16 mg/dL (ref 6–20)
CO2: 26 mmol/L (ref 21–32)
Calcium: 7.8 mg/dL — ABNORMAL LOW (ref 8.5–10.1)
Chloride: 109 mmol/L — ABNORMAL HIGH (ref 97–108)
Creatinine: 1.8 mg/dL — ABNORMAL HIGH (ref 0.55–1.02)
Est, Glom Filt Rate: 36 mL/min/{1.73_m2} — ABNORMAL LOW (ref >60)
Globulin: 3 g/dL (ref 2.0–4.0)
Glucose: 106 mg/dL — ABNORMAL HIGH (ref 65–100)
Potassium: 3.2 mmol/L — ABNORMAL LOW (ref 3.5–5.1)
Sodium: 138 mmol/L (ref 136–145)
Total Bilirubin: 0.3 mg/dL (ref 0.2–1.0)
Total Protein: 4.9 g/dL — ABNORMAL LOW (ref 6.4–8.2)

## 2023-11-03 LAB — CBC WITH AUTO DIFFERENTIAL
Basophils %: 0.9 % (ref 0.0–1.0)
Basophils Absolute: 0.05 10*3/uL (ref 0.00–0.10)
Eosinophils %: 3.1 % (ref 0.0–7.0)
Eosinophils Absolute: 0.18 10*3/uL (ref 0.00–0.40)
Hematocrit: 24.5 % — ABNORMAL LOW (ref 35.0–47.0)
Hemoglobin: 7.8 g/dL — ABNORMAL LOW (ref 11.5–16.0)
Immature Granulocytes %: 0.3 % (ref 0.0–0.5)
Immature Granulocytes Absolute: 0.02 10*3/uL (ref 0.00–0.04)
Lymphocytes %: 35.1 % (ref 12.0–49.0)
Lymphocytes Absolute: 2.06 10*3/uL (ref 0.80–3.50)
MCH: 28.5 pg (ref 26.0–34.0)
MCHC: 31.8 g/dL (ref 30.0–36.5)
MCV: 89.4 fL (ref 80.0–99.0)
MPV: 8.5 fL — ABNORMAL LOW (ref 8.9–12.9)
Monocytes %: 10.1 % (ref 5.0–13.0)
Monocytes Absolute: 0.59 10*3/uL (ref 0.00–1.00)
Neutrophils %: 50.5 % (ref 32.0–75.0)
Neutrophils Absolute: 2.97 10*3/uL (ref 1.80–8.00)
Nucleated RBCs: 0 /100{WBCs}
Platelets: 403 10*3/uL — ABNORMAL HIGH (ref 150–400)
RBC: 2.74 M/uL — ABNORMAL LOW (ref 3.80–5.20)
RDW: 13.5 % (ref 11.5–14.5)
WBC: 5.9 10*3/uL (ref 3.6–11.0)
nRBC: 0 10*3/uL (ref 0.00–0.01)

## 2023-11-03 LAB — C1 ESTERASE INHIBITOR, FUNCTIONAL: C1 Esterase Inh Funct: >110 %{normal}

## 2023-11-03 LAB — POCT GLUCOSE
POC Glucose: 172 mg/dL — ABNORMAL HIGH (ref 65–117)
POC Glucose: 118 mg/dL — ABNORMAL HIGH (ref 65–117)
POC Glucose: 129 mg/dL — ABNORMAL HIGH (ref 65–117)
POC Glucose: 123 mg/dL — ABNORMAL HIGH (ref 65–117)

## 2023-11-03 LAB — C4 COMPLEMENT: C4 Complement: 24 mg/dL (ref 12–38)

## 2023-11-03 MED ORDER — POTASSIUM CHLORIDE ER 10 MEQ PO TBCR
10 | Freq: Once | ORAL | Status: AC
Start: 2023-11-03 — End: 2023-11-03
  Administered 2023-11-03: 14:00:00 20 meq via ORAL

## 2023-11-03 MED ORDER — METOPROLOL SUCCINATE ER 50 MG PO TB24
50 | Freq: Two times a day (BID) | ORAL | Status: DC
Start: 2023-11-03 — End: 2023-11-03

## 2023-11-03 MED ORDER — METOPROLOL SUCCINATE ER 50 MG PO TB24
50 MG | Freq: Every day | ORAL | Status: AC
Start: 2023-11-03 — End: 2023-11-06
  Administered 2023-11-03 – 2023-11-06 (×4): 100 mg via ORAL

## 2023-11-03 MED FILL — DULOXETINE HCL 30 MG PO CPEP: 30 MG | ORAL | Qty: 2

## 2023-11-03 MED FILL — HYDROMORPHONE HCL 1 MG/ML IJ SOLN: 1 MG/ML | INTRAMUSCULAR | Qty: 0.5

## 2023-11-03 MED FILL — BUSPIRONE HCL 15 MG PO TABS: 15 MG | ORAL | Qty: 1

## 2023-11-03 MED FILL — AMLODIPINE BESYLATE 5 MG PO TABS: 5 MG | ORAL | Qty: 2

## 2023-11-03 MED FILL — ACETAMINOPHEN 325 MG PO TABS: 325 MG | ORAL | Qty: 2

## 2023-11-03 MED FILL — AMITRIPTYLINE HCL 10 MG PO TABS: 10 MG | ORAL | Qty: 1

## 2023-11-03 MED FILL — POTASSIUM CHLORIDE ER 10 MEQ PO TBCR: 10 MEQ | ORAL | Qty: 2

## 2023-11-03 MED FILL — POTASSIUM CHLORIDE ER 10 MEQ PO TBCR: 10 MEQ | ORAL | Qty: 4

## 2023-11-03 MED FILL — PANTOPRAZOLE SODIUM 40 MG PO TBEC: 40 MG | ORAL | Qty: 1

## 2023-11-03 MED FILL — CLOPIDOGREL BISULFATE 75 MG PO TABS: 75 MG | ORAL | Qty: 1

## 2023-11-03 MED FILL — HYDRALAZINE HCL 20 MG/ML IJ SOLN: 20 MG/ML | INTRAMUSCULAR | Qty: 1

## 2023-11-03 MED FILL — METOPROLOL SUCCINATE ER 50 MG PO TB24: 50 MG | ORAL | Qty: 2

## 2023-11-03 MED FILL — PROCHLORPERAZINE EDISYLATE 10 MG/2ML IJ SOLN: 10 MG/2ML | INTRAMUSCULAR | Qty: 2

## 2023-11-03 MED FILL — MELATONIN 3 MG PO TABS: 3 MG | ORAL | Qty: 2

## 2023-11-03 MED FILL — LANTUS 100 UNIT/ML SC SOLN: 100 UNIT/ML | SUBCUTANEOUS | Qty: 10

## 2023-11-03 MED FILL — ASPIRIN LOW DOSE 81 MG PO CHEW: 81 MG | ORAL | Qty: 1

## 2023-11-03 NOTE — Progress Notes (Signed)
[facility]                                                                                          Hospitalist Progress Note  Ria Comment, MD  Office Phone: [phone]        Date of Service:  11/03/2023  NAME:  Melinda [name]  DOB:  1981-07-29  MRN:  [id number]       Admission HPI:   "Melinda [name] is a 43 y.o. female  with a medical history significant for DM, gastroparesis, CKD, HTN, HLD , who presents to the ER with abdominal pain, nausea and vomiting.  Started 2 days ago and has worsened since onset.  Describes abdominal pain as a deep strong generalized pain. Associated with fatigue, chills, generalized weakness, decreased urination, pain with urination.  Denies chest pain, palpitations, dyspnea, fever, body aches, chills, headaches, diaphoresis, dark and/or bloody stools, diarrhea, lower extremity edema.  Patient has been compliant with all her medications since discharge last week.  Tried home pain medication at home.  Was able to help some.  Outpatient follow-ups for PCP and GI are scheduled for next week     During her ER evaluation, patient was hemodynamically stable, however patient was tachycardic, tachypneic with elevated blood pressures. Labs were significant for elevated creatinine, elevated glucose, decreased albumin, elevated alk phos, slightly elevated WBC, decreased hemoglobin, elevated platelets.  Patient was given Dilaudid 1 mg IV x 1, morphine 4 mg IV x 1, Reglan 10 mg IV x 1, NS 0.9% 1 L bolus x 1 in the ER.  Patient will be admitted for further evaluation and management for her ongoing AKI related to her intractable nausea and vomiting."          Interval history / Subjective:   1/20: No acute issues since admission. Feeling a little better today, but with persistent mild pain and nausea. Has tolerated a little PO intake with jello, but not much otherwise.      1/21: Feeling better today. Tolerating some liquids, but still having some mild abd  pain and N/V, but feels like she is improving. Had a BM yesterday.     1/23: Slow to improve, still with ongoing nausea and abd pain. No vomiting. Taking in some liquids, but not much. Had BM yesterday.      Assessment & Plan:     1. Intractable N/V: Likely secondary to gastroparesis flare. Less likely hyperemesis from cannabis use despite positive UDS screen, although may contribute.   - UA checked and unremarkable, not a source of pain  - Clears, advance as tolerated  - GI following  - Supportive care with compazine and reglan  - IV fluids until PO intake improves  - Porphyria workup being completed by GI  - Recommend THC cessation  - Topical capsaicin as this may help for THC hyperemesis  - Continue amitriptyline  - Continue BID PPI  - Trend LFTs, noted to elevate a little on 1/22, improving 1/23     2. AKI: admission Creatinine 2.20.  Notably above baseline of approx 1.5 to 1.7. Likely secondary to  IVVD in the setting of intractable nausea and vomiting. Improving on 1/22.   -Strict I's and O's  -IV fluids  -Hold metformin and lisinopril  -Avoid nephrotoxic medications when possible  -Renally dose medications accordingly  -Monitor creatinine with a.m. labs  -To consider renal ultrasound and nephrology consult if AKI does not improve with current interventions     3. Anemia, normocytic, no acute blood loss. Admit hemoglobin 10.2.  Appears to be above baseline, now back to baseline around 8 on 1/22. False elevation may be due to hemoconcentration in the setting of dehydration from intractable nausea vomiting. No acute blood loss.   -Denies dark and/or bloody stool  -Anemia studies ordered  -Monitor with a.m. labs  -Transfuse if hemoglobin less than 7     4. Type 2 diabetes, hyperglycemia: 10/24/2023 Hemoglobin A1c 6.8  -Hold home metformin  - Continue Lantus 10 units daily with low-dose ISS and Accu-Chek q. ACHS.     5. HTN:   - Cont norvasc, metoprolol  - Holding lisinopril for AKI, resume as appropriate      6.  Mood disorder:   - Continue cymbalta and amitriptyline     7. 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 Most recent are:  Vitals:    11/03/23 0737   BP: (!) 193/100   Pulse: (!) 118   Resp: 20   Temp: 98.1 F (36.7 C)   SpO2: 99%         Intake/Output Summary (Last 24 hours) at 11/03/2023 7253  Last data filed at 11/02/2023 2114  Gross per 24 hour   Intake 350 ml   Output --   Net 350 ml        Physical Examination:     I had a face to face encounter with this patient and independently examined them on 11/03/2023 as outlined below:        General : alert and awake, no acute distress  HEENT: moist mucus membranes  Chest: CTAB, normal WOB  CVS: S1 and S2 heard, no m/g/r  Abd: non distended, normoactive bowel sounds, mild midepigastric ttp  Ext: WWP, no edema  Neuro/Psych: no focal deficits  Skin: no rash      Data Review:     I have personally and independently reviewed all pertinent labs, diagnostic studies, imaging, and have provided independent interpretation of the same.     Labs and imaging:     Recent Labs     11/02/23  0305 11/03/23  0146   WBC 6.5 5.9   HGB 8.5* 7.8*   HCT 26.3* 24.5*   PLT 447* 403*     Recent Labs     11/01/23  0444 11/02/23  0305 11/03/23  0146   NA 141 140 138   K 3.8 3.4* 3.2*   CL 108 110* 109*   CO2 30 24 26    BUN 26* 22* 16   MG 2.0  --   --      Recent Labs     10/31/23  2010 10/31/23  2328 11/02/23  0305 11/03/23  0146   ALT 58  --  90* 69  GLOB 4.4*  --  3.1 3.0   GGT  --  136*  --   --      No results for input(s): "INR", "APTT" in the last 72 hours.    Invalid input(s): "PTP"   Recent Labs     10/31/23  2328   TIBC 195*      No results found for: "RBCF"   No results for input(s): "PH", "PCO2", "PO2" in the last 72 hours.  No results for  input(s): "CPK" in the last 72 hours.    Invalid input(s): "CPKMB", "CKNDX", "TROIQ"  Lab Results   Component Value Date/Time    CHOL 341 09/01/2023 10:00 AM    HDL 78 09/01/2023 10:00 AM    LDL 193.8 09/01/2023 10:00 AM    LDL Not calculated due to elevated triglyceride level 09/16/2022 10:30 AM     No results found for: "GLUCPOC"  @LABUA @      No orders to display       09/17/23    ECHO (TTE) COMPLETE (PRN CONTRAST/BUBBLE/STRAIN/3D) 09/22/2023  9:33 PM (Final)    Interpretation Summary    Left Ventricle: Normal left ventricular systolic function. EF by 2D Simpsons Biplane is 59%. Left ventricle size is normal. Findings consistent with moderate concentric hypertrophy. Normal wall motion. Diastolic dysfunction present with normal LV EF.    Tricuspid Valve: Mild regurgitation.    Image quality is adequate.    Signed by: Victorino December, MD on 09/22/2023  9:33 PM        ______________________________________________________________________                 Ria Comment, MD

## 2023-11-03 NOTE — Progress Notes (Signed)
Jabaree Mercado, PA-C                       [phone] office             Monday-Friday 8:00 am-4:30 pm  I am not permitted to use "perfect serve" use above for contact, thanks.        Gastroenterology Progress Note    November 03, 2023  Admit Date: 10/31/2023         Narrative Assessment and Plan   43 y.o. female being followed by GI for abdominal pain, nausea, and vomiting.  Nausea and vomiting have improved, abdominal pain persists.  Workup in process including C1 esterase inhibitor.  Urine porphobilinogen aminolevulinic acid have not been collected.  CRP within normal limits, ESR elevated.  C4 complement within normal limits.    Impression:  Recurrent nausea and vomiting  Recurrent abdominal pain    Plan:  Continue with current lab workup.  Continue supportive care including IV fluids and antiemetics.  Recommend complete THC cessation.    [name], PA-C    Subjective:   Chief Complaint: Recurrent abdominal pain    HPI: Patient resting in bed, reports generalized abdominal pain.  Denies nausea.  Abdomen soft and nondistended on exam.    ROS:  The previous review of systems on initial consultation / H&P is noted and reviewed.  Specific changes noted above in HPI.    Current Medications:     Current Facility-Administered Medications   Medication Dose Route Frequency    metoprolol succinate (TOPROL XL) extended release tablet 100 mg  100 mg Oral Daily    0.9 % sodium chloride infusion   IntraVENous Continuous    amLODIPine (NORVASC) tablet 10 mg  10 mg Oral Daily    amitriptyline (ELAVIL) tablet 10 mg  10 mg Oral Nightly    aspirin chewable tablet 81 mg  81 mg Oral Daily    busPIRone (BUSPAR) tablet 15 mg  15 mg Oral TID    clopidogrel (PLAVIX) tablet 75 mg  75 mg Oral Daily    dicyclomine (BENTYL) tablet 20 mg  20 mg Oral Q6H PRN    DULoxetine (CYMBALTA) extended release capsule 60 mg  60 mg Oral Daily     pantoprazole (PROTONIX) tablet 40 mg  40 mg Oral BID AC    sodium chloride flush 0.9 % injection 5-40 mL  5-40 mL IntraVENous 2 times per day    sodium chloride flush 0.9 % injection 5-40 mL  5-40 mL IntraVENous PRN    0.9 % sodium chloride infusion   IntraVENous PRN    acetaminophen (TYLENOL) tablet 650 mg  650 mg Oral Q6H PRN    Or    acetaminophen (TYLENOL) suppository 650 mg  650 mg Rectal Q6H PRN    potassium chloride (KLOR-CON) extended release tablet 40 mEq  40 mEq Oral PRN    Or    potassium bicarb-citric acid (EFFER-K) effervescent tablet 40 mEq  40 mEq Oral PRN    Or    potassium chloride 10 mEq/100 mL IVPB (Peripheral Line)  10 mEq IntraVENous PRN    magnesium sulfate 2000 mg in 50 mL IVPB premix  2,000 mg IntraVENous PRN    melatonin tablet 6 mg  6 mg Oral Nightly PRN    traZODone (DESYREL) tablet 50 mg  50 mg Oral Nightly PRN    insulin glargine (LANTUS) injection vial 10 Units  10 Units SubCUTAneous Daily  insulin lispro (HUMALOG,ADMELOG) injection vial 0-4 Units  0-4 Units SubCUTAneous 4x Daily AC & HS    metoclopramide (REGLAN) injection 5 mg  5 mg IntraVENous Q6H PRN    capsaicin (ZOSTRIX) 0.025 % cream   Topical BID    hydrALAZINE (APRESOLINE) injection 10 mg  10 mg IntraVENous Q6H PRN    prochlorperazine (COMPAZINE) injection 10 mg  10 mg IntraVENous Q6H PRN    traMADol (ULTRAM) tablet 50 mg  50 mg Oral Q6H PRN    HYDROmorphone (DILAUDID) injection 0.25 mg  0.25 mg IntraVENous Q3H PRN    Or    HYDROmorphone (DILAUDID) injection 0.5 mg  0.5 mg IntraVENous Q3H PRN    acetaminophen (TYLENOL) tablet 650 mg  650 mg Oral Q4H PRN    glucose chewable tablet 16 g  4 tablet Oral PRN    dextrose bolus 10% 125 mL  125 mL IntraVENous PRN    Or    dextrose bolus 10% 250 mL  250 mL IntraVENous PRN    glucagon injection 1 mg  1 mg SubCUTAneous PRN    dextrose 10 % infusion   IntraVENous Continuous PRN       Objective:     VITALS:   Last 24hrs VS reviewed since prior progress note. Most recent are:  Vitals:     11/03/23 1000   BP: (!) 144/81   Pulse: 89   Resp:    Temp:    SpO2: 99%     Temp (24hrs), Avg:98.2 F (36.8 C), Min:98.1 F (36.7 C), Max:98.4 F (36.9 C)      Intake/Output Summary (Last 24 hours) at 11/03/2023 1100  Last data filed at 11/02/2023 2114  Gross per 24 hour   Intake 350 ml   Output --   Net 350 ml       General: alert and no distress  Head: Normocephalic, without obvious abnormality, atraumatic  Eyes: anicteric sclerae and conjuntiva clear  Lungs: normal respiratory effort  Abd: not distended, soft, +generalized tenderness to palpation  Ext: no cyanosis and no edema  Skin: normal skin color, no rashes, and texture normal  Neuro:  Alert and oriented  Psych: not anxious, cooperative, appropriate affect     Lab Data Reviewed:   Recent Labs     11/01/23  0444 11/02/23  0305 11/03/23  0146   WBC 11.4* 6.5 5.9   HGB 9.2* 8.5* 7.8*   HCT 27.2* 26.3* 24.5*   PLT 459* 447* 403*     Recent Labs     10/31/23  2010 11/01/23  0444 11/02/23  0305 11/03/23  0146   NA 140 141 140 138   K 3.6 3.8 3.4* 3.2*   CL 104 108 110* 109*   CO2 28 30 24 26    BUN 27* 26* 22* 16   MG  --  2.0  --   --    ALT 58  --  90* 69     No results found for: "GLUCPOC"  No results for input(s): "PH", "PCO2", "PO2", "HCO3", "FIO2" in the last 72 hours.  No results for input(s): "INR" in the last 72 hours.        Assessment:   (See above)  Principal Problem:    Intractable nausea and vomiting  Active Problems:    Gastroparesis  Resolved Problems:    * No resolved hospital problems. *      Plan:   (See above)      Signed By: [name],  PA-C     11/03/2023  11:00 AM

## 2023-11-03 NOTE — Progress Notes (Signed)
Physician Progress Note      PATIENT:               Melinda [name], Melinda [name]  CSN #:                  [id number]  DOB:                       01/02/1981  ADMIT DATE:       10/31/2023 8:12 PM  DISCH DATE:  RESPONDING  PROVIDER #:        Ellwood Sayers MD          QUERY TEXT:    Noted documentation of Acute Kidney Injury starting in H&P.  Pt's b/l Cr 1.7   and Cr on admission 2.20.  This is not 1.5x her baseline per KDIGO guidelines.    In order to support the diagnosis of AKI, please include additional clinical   indicators in your documentation.? Or please document if the diagnosis of AKI   has been ruled out after further study.    The medical record reflects the following:  Risk Factors: DM, HTN    Clinical Indicators:    Cr 2.20-2.04-1.86-1.80    H&P:  AKI  -Creatinine 2.20.  Notably above baseline.  -Etiology likely due to dehydration in the setting of intractable nausea and   vomiting  -Strict I's and O's  -IV fluids  -Hold metformin and lisinopril  -Avoid nephrotoxic medications when possible  -Renally dose medications accordingly  -Monitor creatinine with a.m. labs  -To consider renal ultrasound and nephrology consult if AKI does not improve   with current interventions    Treatment: IVFs; hold metformin, lisinopril    Defined by Kidney Disease Improving Global Outcomes (KDIGO) clinical practice   guideline for acute kidney injury:  -Increase in SCr by greater than or equal to 0.3 mg/dl within 48 hours; or  -Increase or decrease in SCr to greater than or equal to 1.5 times baseline,   which is known or presumed to have occurred within the prior 7 days; or  -Urine volume < 0.55ml/kg/h for 6 hours.  Options provided:  -- Acute kidney injury evidenced by, Please document evidence as well as a   numerical baseline creatinine, if known.  -- Acute kidney injury ruled out after study  -- Other - I will add my own diagnosis  -- Disagree - Not applicable / Not valid  -- Disagree - Clinically unable to determine / Unknown  --  Refer to Clinical Documentation Reviewer    PROVIDER RESPONSE TEXT:    This patient has acute kidney injury as evidenced by intravascular volume   depletion    Query created by: Marene Lenz on 11/03/2023 8:45 AM      Electronically signed by:  Ellwood Sayers MD 11/03/2023 8:49 AM

## 2023-11-03 NOTE — Care Coordination-Inpatient (Signed)
Care Management Initial Assessment  11/03/2023 4:13 PM  If patient is discharged prior to next notation, then this note serves as note for discharge by case management.    Reason for Admission:   AKI (acute kidney injury) (HCC) [N17.9]  Intractable nausea and vomiting [R11.2]  Gastroparesis [K31.84]         Patient Admission Status: Inpatient  Date Admitted to INP: 11/03/23  RUR: Readmission Risk Score: 42.2    Hospitalization in the last 30 days (Readmission):  Yes        Advance Care Planning:  Code Status: Full Code  Primary Healthcare Decision Maker: Legal Next of Kin  Primary Decision Maker: Carmon Sails - Child [phone]   Advance Directive: Edison Pace      __________________________________________________________________________  Assessment:      11/03/23 1105   Service Assessment   Patient Orientation Alert and Oriented   Cognition Alert   History Provided By Patient   Primary Caregiver Self   Support Systems Children   Patient's Healthcare Decision Maker is: Legal Next of Kin   Last Visit to PCP Within last 3 months   Prior Functional Level Independent in ADLs/IADLs   Current Functional Level Independent in ADLs/IADLs   Can patient return to prior living arrangement Yes   Ability to make needs known: Good   Family able to assist with home care needs: Yes   Social/Functional History   Lives With Spouse   Type of Home House   Bathroom Shower/Tub None   Bathroom Equipment None   Home Equipment None   Receives Help From Family   Prior Level of Assist for ADLs Independent   Prior Level of Assist for [employer] Independent   Ambulation Assistance Independent   Prior Level of Assist for Transfers Independent   Mode of Transportation Car   Condition of Participation: Discharge Planning   The Plan for Transition of Care is related to the following treatment goals: Home        11/04/23 1600   Readmission Assessment   Number of Days since last admission? 8-30 days   Previous Disposition Home with Family   Who is  being Interviewed Unable to Complete   What was the patient's/caregiver's perception as to why they think they needed to return back to the hospital? Other (Comment)  (Intractable nausea and vomiting)   Did you visit your Primary Care Physician after you left the hospital, before you returned this time? No   Why weren't you able to visit your PCP? Other (Comment)  (unknown)   Did you see a specialist, such as Cardiac, Pulmonary, Orthopedic Physician, etc. after you left the hospital? No   Who advised the patient to return to the hospital? Self-referral   Does the patient report anything that got in the way of taking their medications? No   What reasons did they give? Other (Comment)  (N/A)   In our efforts to provide the best possible care to you and others like you, can you think of anything that we could have done to help you after you left the hospital the first time, so that you might not have needed to return so soon? Other (Comment)  (No)       Comments:  Assessment completed via chart review. Pt has been readmitted. Last admission was 10/21/23 for abdominal pain and nausea. Per chart review, pt is reported to live with family and is independent at baseline. No anticipated CM needs at this time. Pt will likely need  outpatient follow-up.     Discharge Concerns: [] Yes [x] No [] Unknown   Describe:    Financial concerns/barriers: [] Yes, explain: [x] No [] Unknown/Not discussed  __________________________________________________________________________    Insurer:   Active Insurance as of 10/31/2023       Primary Coverage       Payor Plan Insurance Group Employer/Plan Group    [place]        Payor Plan Address Payor Plan Phone Number Payor Plan Fax Number Effective Dates    [phone]   05/28/2023 - None [place] [address]         Subscriber Name Subscriber Birth Date Member ID       ARTEMIS, KOLLER [birth year] [id number]                     PCP: Orlean Bradford, APRN - NP   Address:  [address] / [place] [address]   Phone number: [phone]    Pharmacy:   [facility] [address], [place] - [address] - [place] [phone] - F [phone]  [address]  [place] [address]  Phone: [phone] Fax: [phone]    DC Transport:         Transition of care plan:    [x] Unable to determine at this time. Awaiting clinical progress, and disposition recommendations.    []  Home. No assistance required.     []  Home. Pt refused recommended services.    []  Home with family assistance as needed, and outpatient follow-up.    []  Home with Outpatient PT and outpatient follow-up   Pt aware of OP appt? [] Yes, Provider:   [] Not scheduled   Transport provider:     []  Home with outpatient services.    Specify:    []  Home with Home Health   - Freedom of Choice offered? []  Yes, Preference:   []  NA    [] SNF/IPR   -[] Freedom of Choice offered, and preferences given:   [] Listing provided and preferences requested   -Status: [] Pending [] Accepted:    -Auth required: [] Yes [] No    -Auth initiated date:   -3 midnight stay required: [] Yes [] No  Date satisfied:     []  LTC:     []  Home with Hospice   - Freedom of Choice offered? []  Yes, Preference:   []  NA    []  Dispatch Health information provided.     []  Other:       Mabell Esguerra  Case Management Department  For questions or concerns, please PerfectServe

## 2023-11-03 NOTE — Consults (Signed)
Session ID: [id number]  Language: Spanish  Interpreter ID: #[id number]  Interpreter Name: Minerva Areola

## 2023-11-04 LAB — CBC WITH AUTO DIFFERENTIAL
Basophils %: 0.9 % (ref 0.0–1.0)
Basophils Absolute: 0.06 10*3/uL (ref 0.00–0.10)
Eosinophils %: 2 % (ref 0.0–7.0)
Eosinophils Absolute: 0.14 10*3/uL (ref 0.00–0.40)
Hematocrit: 27 % — ABNORMAL LOW (ref 35.0–47.0)
Hemoglobin: 8.7 g/dL — ABNORMAL LOW (ref 11.5–16.0)
Immature Granulocytes %: 0.1 % (ref 0.0–0.5)
Immature Granulocytes Absolute: 0.01 10*3/uL (ref 0.00–0.04)
Lymphocytes %: 16.5 % (ref 12.0–49.0)
Lymphocytes Absolute: 1.14 10*3/uL (ref 0.80–3.50)
MCH: 28.7 pg (ref 26.0–34.0)
MCHC: 32.2 g/dL (ref 30.0–36.5)
MCV: 89.1 fL (ref 80.0–99.0)
Monocytes %: 6.2 % (ref 5.0–13.0)
Monocytes Absolute: 0.43 10*3/uL (ref 0.00–1.00)
Neutrophils %: 74.3 % (ref 32.0–75.0)
Neutrophils Absolute: 5.12 10*3/uL (ref 1.80–8.00)
Nucleated RBCs: 0 /100{WBCs}
Platelets: 424 10*3/uL — ABNORMAL HIGH (ref 150–400)
RBC: 3.03 M/uL — ABNORMAL LOW (ref 3.80–5.20)
RDW: 13.4 % (ref 11.5–14.5)
WBC: 6.9 10*3/uL (ref 3.6–11.0)
nRBC: 0 10*3/uL (ref 0.00–0.01)

## 2023-11-04 LAB — COMPREHENSIVE METABOLIC PANEL
ALT: 110 U/L — ABNORMAL HIGH (ref 12–78)
AST: 100 U/L — ABNORMAL HIGH (ref 15–37)
Albumin/Globulin Ratio: 0.6 — ABNORMAL LOW (ref 1.1–2.2)
Albumin: 1.9 g/dL — ABNORMAL LOW (ref 3.5–5.0)
Alk Phosphatase: 196 U/L — ABNORMAL HIGH (ref 45–117)
Anion Gap: 5 mmol/L (ref 2–12)
BUN/Creatinine Ratio: 8 — ABNORMAL LOW (ref 12–20)
BUN: 13 mg/dL (ref 6–20)
CO2: 22 mmol/L (ref 21–32)
Calcium: 8 mg/dL — ABNORMAL LOW (ref 8.5–10.1)
Chloride: 111 mmol/L — ABNORMAL HIGH (ref 97–108)
Creatinine: 1.59 mg/dL — ABNORMAL HIGH (ref 0.55–1.02)
Est, Glom Filt Rate: 42 mL/min/{1.73_m2} — ABNORMAL LOW (ref >60)
Globulin: 3.1 g/dL (ref 2.0–4.0)
Glucose: 113 mg/dL — ABNORMAL HIGH (ref 65–100)
Potassium: 3.8 mmol/L (ref 3.5–5.1)
Sodium: 138 mmol/L (ref 136–145)
Total Bilirubin: 0.2 mg/dL (ref 0.2–1.0)
Total Protein: 5 g/dL — ABNORMAL LOW (ref 6.4–8.2)

## 2023-11-04 LAB — C-REACTIVE PROTEIN: CRP: <0.29 mg/dL (ref 0.00–0.30)

## 2023-11-04 LAB — POCT GLUCOSE
POC Glucose: 101 mg/dL (ref 65–117)
POC Glucose: 122 mg/dL — ABNORMAL HIGH (ref 65–117)
POC Glucose: 179 mg/dL — ABNORMAL HIGH (ref 65–117)

## 2023-11-04 LAB — LIPASE: Lipase: 11 U/L — ABNORMAL LOW (ref 13–75)

## 2023-11-04 LAB — PHOSPHORUS: Phosphorus: 3.2 mg/dL (ref 2.6–4.7)

## 2023-11-04 LAB — MAGNESIUM: Magnesium: 1.4 mg/dL — ABNORMAL LOW (ref 1.6–2.4)

## 2023-11-04 MED ORDER — MAGNESIUM SULFATE 2000 MG/50 ML IVPB PREMIX
2 | Freq: Once | INTRAVENOUS | Status: AC
Start: 2023-11-04 — End: 2023-11-04
  Administered 2023-11-04: 14:00:00 2000 mg via INTRAVENOUS

## 2023-11-04 MED FILL — METOPROLOL SUCCINATE ER 50 MG PO TB24: 50 MG | ORAL | Qty: 2

## 2023-11-04 MED FILL — ASPIRIN LOW DOSE 81 MG PO CHEW: 81 MG | ORAL | Qty: 1

## 2023-11-04 MED FILL — HYDROMORPHONE HCL 1 MG/ML IJ SOLN: 1 MG/ML | INTRAMUSCULAR | Qty: 0.5

## 2023-11-04 MED FILL — PROCHLORPERAZINE EDISYLATE 10 MG/2ML IJ SOLN: 10 MG/2ML | INTRAMUSCULAR | Qty: 2

## 2023-11-04 MED FILL — PANTOPRAZOLE SODIUM 40 MG PO TBEC: 40 MG | ORAL | Qty: 1

## 2023-11-04 MED FILL — BUSPIRONE HCL 15 MG PO TABS: 15 MG | ORAL | Qty: 1

## 2023-11-04 MED FILL — AMITRIPTYLINE HCL 10 MG PO TABS: 10 MG | ORAL | Qty: 1

## 2023-11-04 MED FILL — DULOXETINE HCL 30 MG PO CPEP: 30 MG | ORAL | Qty: 2

## 2023-11-04 MED FILL — AMLODIPINE BESYLATE 5 MG PO TABS: 5 MG | ORAL | Qty: 2

## 2023-11-04 MED FILL — LANTUS 100 UNIT/ML SC SOLN: 100 UNIT/ML | SUBCUTANEOUS | Qty: 10

## 2023-11-04 MED FILL — CLOPIDOGREL BISULFATE 75 MG PO TABS: 75 MG | ORAL | Qty: 1

## 2023-11-04 MED FILL — MAGNESIUM SULFATE 2 GM/50ML IV SOLN: 2 GM/50ML | INTRAVENOUS | Qty: 50

## 2023-11-04 NOTE — Progress Notes (Signed)
[place] [facility]  [address] [place], [place] [address]  [phone]        Hospitalist Progress Note      NAME: Melinda [name]   DOB:  Oct 24, 1980  MRM:  [id number]    Date/Time of service: 11/04/2023  12:25 PM       Subjective:     Chief Complaint:  Patient was personally seen and examined by me during this time period.  Chart reviewed.  Still with abd pain, nausea       Objective:       Vitals:       Last 24hrs VS reviewed since prior progress note. Most recent are:    Vitals:    11/04/23 0914   BP:    Pulse:    Resp: 20   Temp:    SpO2:      SpO2 Readings from Last 6 Encounters:   11/04/23 100%   10/25/23 96%   10/14/23 98%   09/22/23 98%   09/14/23 100%   08/31/23 100%          Intake/Output Summary (Last 24 hours) at 11/04/2023 1225  Last data filed at 11/03/2023 2027  Gross per 24 hour   Intake 10 ml   Output --   Net 10 ml        Exam:     Physical Exam:    Gen:  Well-developed, well-nourished, obese, mild distress  HEENT:  Pink conjunctivae, PERRL, hearing intact to voice, moist mucous membranes  Neck:  Supple, without masses, thyroid non-tender  Resp:  No accessory muscle use, clear breath sounds without wheezes rales or rhonchi  Card:  No murmurs, normal S1, S2 without thrills, bruits or peripheral edema  Abd:  Soft, generalized pain, non-distended, normoactive bowel sounds are present  Musc:  No cyanosis or clubbing  Skin:  No rashes  Neuro:  Cranial nerves 3-12 are grossly intact, follows commands appropriately  Psych:  poor insight, oriented to person, place and time, alert.  Spanish speaking    Medications Reviewed: (see below)    Lab Data Reviewed: (see below)    ______________________________________________________________________    Medications:     Current Facility-Administered Medications   Medication Dose Route Frequency    metoprolol succinate (TOPROL XL) extended release tablet 100 mg  100 mg Oral Daily    0.9 % sodium chloride infusion   IntraVENous  Continuous    amLODIPine (NORVASC) tablet 10 mg  10 mg Oral Daily    amitriptyline (ELAVIL) tablet 10 mg  10 mg Oral Nightly    aspirin chewable tablet 81 mg  81 mg Oral Daily    busPIRone (BUSPAR) tablet 15 mg  15 mg Oral TID    clopidogrel (PLAVIX) tablet 75 mg  75 mg Oral Daily    dicyclomine (BENTYL) tablet 20 mg  20 mg Oral Q6H PRN    DULoxetine (CYMBALTA) extended release capsule 60 mg  60 mg Oral Daily    pantoprazole (PROTONIX) tablet 40 mg  40 mg Oral BID AC    sodium chloride flush 0.9 % injection 5-40 mL  5-40 mL IntraVENous 2 times per day    sodium chloride flush 0.9 % injection 5-40 mL  5-40 mL IntraVENous PRN    0.9 % sodium chloride infusion   IntraVENous PRN    acetaminophen (TYLENOL) tablet 650 mg  650 mg Oral Q6H PRN    Or    acetaminophen (TYLENOL) suppository 650 mg  650 mg Rectal Q6H PRN    melatonin tablet 6 mg  6 mg Oral Nightly PRN    traZODone (DESYREL) tablet 50 mg  50 mg Oral Nightly PRN    insulin glargine (LANTUS) injection vial 10 Units  10 Units SubCUTAneous Daily    insulin lispro (HUMALOG,ADMELOG) injection vial 0-4 Units  0-4 Units SubCUTAneous 4x Daily AC & HS    metoclopramide (REGLAN) injection 5 mg  5 mg IntraVENous Q6H PRN    capsaicin (ZOSTRIX) 0.025 % cream   Topical BID    hydrALAZINE (APRESOLINE) injection 10 mg  10 mg IntraVENous Q6H PRN    prochlorperazine (COMPAZINE) injection 10 mg  10 mg IntraVENous Q6H PRN    traMADol (ULTRAM) tablet 50 mg  50 mg Oral Q6H PRN    HYDROmorphone (DILAUDID) injection 0.25 mg  0.25 mg IntraVENous Q3H PRN    Or    HYDROmorphone (DILAUDID) injection 0.5 mg  0.5 mg IntraVENous Q3H PRN    acetaminophen (TYLENOL) tablet 650 mg  650 mg Oral Q4H PRN    glucose chewable tablet 16 g  4 tablet Oral PRN    dextrose bolus 10% 125 mL  125 mL IntraVENous PRN    Or    dextrose bolus 10% 250 mL  250 mL IntraVENous PRN    glucagon injection 1 mg  1 mg SubCUTAneous PRN    dextrose 10 % infusion   IntraVENous Continuous PRN          Lab Review:     Recent  Labs     11/02/23  0305 11/03/23  0146 11/04/23  0621   WBC 6.5 5.9 6.9   HGB 8.5* 7.8* 8.7*   HCT 26.3* 24.5* 27.0*   PLT 447* 403* 424*     Recent Labs     11/02/23  0305 11/03/23  0146 11/04/23  0621   NA 140 138 138   K 3.4* 3.2* 3.8   CL 110* 109* 111*   CO2 24 26 22    BUN 22* 16 13   MG  --   --  1.4*   PHOS  --   --  3.2   ALT 90* 69 110*     No results found for: "GLUCPOC"       Assessment / Plan:     43 yo hx of HTN, DM, CKD 3, gastroparesis, cholecystectomy, 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  Unclear etiology.  Likely due to gastroparesis and THC abuse.  Will cont supportive care with IV antiemetics, IV dilaudid prn, PPI.  GI was following    2) AKI/CKD 3: Cr slowly improving.  Likely due to volume depletion.  Will cont to hold ACEi, metformin.  Cont IVF, monitor BMP    3) Elevated LFTs: mild, likely due to N/V.  Will monitor     4) Anemia: chronic, likely dilutional.  No bleeding.  Will monitor Hgb    5) HTN: cont norvasc, metoprolol.  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  hold metformin.  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  Cont ASA, plavix    9) Severe pro-cal malnutrition: albumin 1.9.  nutrition following.  Encourage PO    10) HypoMg: replete IV, monitor     **  Prior records, notes, labs, radiology, and medications reviewed in [facility]**    Total time spent with patient care: 69 Minutes **I personally saw and examined the patient during this time period**                 Care Plan discussed with: Patient, nursing     Discussed:  Care Plan    Prophylaxis:  SCD's    Disposition:  Home w/Family           ___________________________________________________    Attending Physician: Paulo Fruit, MD

## 2023-11-04 NOTE — Progress Notes (Signed)
Melinda Knobel, PA-C                       [phone] office             Monday-Friday 8:00 am-4:30 pm  I am not permitted to use "perfect serve" use above for contact, thanks.        Gastroenterology Progress Note    November 04, 2023  Admit Date: 10/31/2023         Narrative Assessment and Plan   43 y.o. female being followed by GI for recurrent nausea and vomiting, which have resolved.  Abdominal pain is less of her concern today, and she complains of generalized body pains and back pain. C1 esterase inhibitor values WNL, plasma porphyrins in process.     Impression:  Recurrent nausea and vomiting  Recurrent abdominal pain    Plan:  Continue with supportive care including IV fluids, antiemetics, pain medications as needed.  Continue with amitriptyline for management of functional abdominal pain.  Continue with pantoprazole 40 mg twice daily.  Reasonable to continue with capsaicin for possible cannabinoid hyperemesis.  Recommend complete cessation of marijuana use.  Inpatient GI to see as needed over the weekend. Call if needed.     [name], PA-C    Subjective:   Chief Complaint: "My whole body hurts"    HPI: Patient reports generalized pain throughout her body and her back today.  Denies nausea or vomiting.  Tolerated clear liquids for breakfast.    Remote Spanish interpreter [place], [place] # [id number], provided translation services for this encounter.     ROS:  The previous review of systems on initial consultation / H&P is noted and reviewed.  Specific changes noted above in HPI.    Current Medications:     Current Facility-Administered Medications   Medication Dose Route Frequency    magnesium sulfate 2000 mg in 50 mL IVPB premix  2,000 mg IntraVENous Once    metoprolol succinate (TOPROL XL) extended release tablet 100 mg  100 mg Oral Daily    0.9 % sodium chloride infusion   IntraVENous Continuous    amLODIPine  (NORVASC) tablet 10 mg  10 mg Oral Daily    amitriptyline (ELAVIL) tablet 10 mg  10 mg Oral Nightly    aspirin chewable tablet 81 mg  81 mg Oral Daily    busPIRone (BUSPAR) tablet 15 mg  15 mg Oral TID    clopidogrel (PLAVIX) tablet 75 mg  75 mg Oral Daily    dicyclomine (BENTYL) tablet 20 mg  20 mg Oral Q6H PRN    DULoxetine (CYMBALTA) extended release capsule 60 mg  60 mg Oral Daily    pantoprazole (PROTONIX) tablet 40 mg  40 mg Oral BID AC    sodium chloride flush 0.9 % injection 5-40 mL  5-40 mL IntraVENous 2 times per day    sodium chloride flush 0.9 % injection 5-40 mL  5-40 mL IntraVENous PRN    0.9 % sodium chloride infusion   IntraVENous PRN    acetaminophen (TYLENOL) tablet 650 mg  650 mg Oral Q6H PRN    Or    acetaminophen (TYLENOL) suppository 650 mg  650 mg Rectal Q6H PRN    melatonin tablet 6 mg  6 mg Oral Nightly PRN    traZODone (DESYREL) tablet 50 mg  50 mg Oral Nightly PRN    insulin glargine (LANTUS) injection vial 10 Units  10 Units SubCUTAneous Daily  insulin lispro (HUMALOG,ADMELOG) injection vial 0-4 Units  0-4 Units SubCUTAneous 4x Daily AC & HS    metoclopramide (REGLAN) injection 5 mg  5 mg IntraVENous Q6H PRN    capsaicin (ZOSTRIX) 0.025 % cream   Topical BID    hydrALAZINE (APRESOLINE) injection 10 mg  10 mg IntraVENous Q6H PRN    prochlorperazine (COMPAZINE) injection 10 mg  10 mg IntraVENous Q6H PRN    traMADol (ULTRAM) tablet 50 mg  50 mg Oral Q6H PRN    HYDROmorphone (DILAUDID) injection 0.25 mg  0.25 mg IntraVENous Q3H PRN    Or    HYDROmorphone (DILAUDID) injection 0.5 mg  0.5 mg IntraVENous Q3H PRN    acetaminophen (TYLENOL) tablet 650 mg  650 mg Oral Q4H PRN    glucose chewable tablet 16 g  4 tablet Oral PRN    dextrose bolus 10% 125 mL  125 mL IntraVENous PRN    Or    dextrose bolus 10% 250 mL  250 mL IntraVENous PRN    glucagon injection 1 mg  1 mg SubCUTAneous PRN    dextrose 10 % infusion   IntraVENous Continuous PRN       Objective:     VITALS:   Last 24hrs VS reviewed  since prior progress note. Most recent are:  Vitals:    11/04/23 0914   BP:    Pulse:    Resp: 20   Temp:    SpO2:      Temp (24hrs), Avg:98.1 F (36.7 C), Min:97.9 F (36.6 C), Max:98.6 F (37 C)      Intake/Output Summary (Last 24 hours) at 11/04/2023 1018  Last data filed at 11/03/2023 2027  Gross per 24 hour   Intake 10 ml   Output --   Net 10 ml       General: alert and no distress  Head: Normocephalic, without obvious abnormality, atraumatic  Eyes: anicteric sclerae and conjuntiva clear  Lungs: normal respiratory effort  Ext: no cyanosis and no edema  Skin: normal skin color, no rashes, and texture normal  Neuro:  Alert and oriented  Psych: not anxious, cooperative, appropriate affect     Lab Data Reviewed:   Recent Labs     11/02/23  0305 11/03/23  0146 11/04/23  0621   WBC 6.5 5.9 6.9   HGB 8.5* 7.8* 8.7*   HCT 26.3* 24.5* 27.0*   PLT 447* 403* 424*     Recent Labs     11/02/23  0305 11/03/23  0146 11/04/23  0621   NA 140 138 138   K 3.4* 3.2* 3.8   CL 110* 109* 111*   CO2 24 26 22    BUN 22* 16 13   MG  --   --  1.4*   PHOS  --   --  3.2   ALT 90* 69 110*     No results found for: "GLUCPOC"  No results for input(s): "PH", "PCO2", "PO2", "HCO3", "FIO2" in the last 72 hours.  No results for input(s): "INR" in the last 72 hours.        Assessment:   (See above)  Principal Problem:    Intractable nausea and vomiting  Active Problems:    Gastroparesis  Resolved Problems:    * No resolved hospital problems. *      Plan:   (See above)      Signed By: [name], PA-C     11/04/2023  10:18 AM

## 2023-11-04 NOTE — Plan of Care (Signed)
Problem: Pain  Goal: Verbalizes/displays adequate comfort level or baseline comfort level  Outcome: Progressing     Problem: Skin/Tissue Integrity  Goal: Absence of new skin breakdown  Description: 1.  Monitor for areas of redness and/or skin breakdown  2.  Assess vascular access sites hourly  3.  Every 4-6 hours minimum:  Change oxygen saturation probe site  4.  Every 4-6 hours:  If on nasal continuous positive airway pressure, respiratory therapy assess nares and determine need for appliance change or resting period.  Outcome: Progressing     Problem: Safety - Adult  Goal: Free from fall injury  Outcome: Progressing     Problem: Chronic Conditions and Co-morbidities  Goal: Patient's chronic conditions and co-morbidity symptoms are monitored and maintained or improved  Outcome: Progressing

## 2023-11-04 NOTE — Progress Notes (Signed)
Spiritual Health History and Assessment/Progress Note  [facility]    Initial Encounter,  ,  ,      Name: Melinda [name] MRN: [id number]    Age: 43 y.o.     Sex: female   Language: Spanish   Religion: Catholic   Intractable nausea and vomiting     Date: 11/04/2023            Total Time Calculated: 30 min              Spiritual Assessment began in [facility] MULTI-SPECIALTY ONCOLOGY 1            Encounter Overview/Reason: Initial Encounter  Service Provided For: Patient    Faith, Belief, Meaning:   Patient identifies as spiritual, is connected with a faith tradition or spiritual practice, and has beliefs or practices that help with coping during difficult times  Family/Friends No family/friends present      Importance and Influence:  Patient has spiritual/personal beliefs that influence decisions regarding their health  Family/Friends No family/friends present    Community:  Patient feels well-supported. Support system includes: Spouse/Partner, Children, and Extended family  Family/Friends No family/friends present    Assessment and Plan of Care:   Reviewed chart prior to meeting Pt bedside. Pt is known to Chaplain from previous hospitalizations, and Pt remembers Chaplain. She is obviously in discomfort. She expressed that she isn't feeling good right now. She confirmed that she is married and has family support. She expressed no needs at this time; Chaplain shared continued prayer and support for her. She said, "thank you," as she holds her abdominal in discomfort.        Patient Interventions include: Facilitated expression of thoughts and feelings, Affirmed coping skills/support systems, and Facilitated life review and/ or legacy  Family/Friends Interventions include: No family/friends present    Patient Plan of Care: Spiritual Care available upon further referral  Family/Friends Plan of Care: No family/friends present    Electronically signed by Hale Bogus, [facility] on 11/04/2023 at 2:12 PM  For  Chaplain support, please call Spiritual Health Team @ [phone]- PRAY [phone])

## 2023-11-04 NOTE — Progress Notes (Signed)
Physical Therapy orders acknowledged, chart reviewed and discussed with nurse and OT patient is up Ad lib. Patient reports no difficulty with mobility and declines any needs of therapy service. We will complete therapy order.

## 2023-11-04 NOTE — Progress Notes (Signed)
Patient/caregivers speak Spanish as their preferred language for their healthcare communication. For safe communication, use the [employer] interpreter carts or call:      Lynne Logan  Senior Interpreter-Navigator [phone]  General phone: [phone] ( [phone])   Email: [email or web address]      Please always document the use of interpreting services (Interpreter's ID number) in your clinical notes.      Our interpreters are available for team members working with limited?English proficient (LEP) patients remotely, via phone or video or in person (if needed for special cases).      When using family members to interpret, for the safety of the patient and protection of the communication of both our patient and  [facility] staff the VRI or telephonic interpreter should remain on the line to monitor that all communication is accurate and complete. The interpreter should be instructed to notify [facility] staff immediately if there are any inaccuracies.

## 2023-11-04 NOTE — Consults (Signed)
Session ID: [id number]  Language: Spanish  Interpreter ID: #[id number]  Interpreter Name: Reginia Forts

## 2023-11-04 NOTE — Progress Notes (Signed)
Occupational therapy    Order acknowledged and chart reviewed. Discussed with RN who reports that patient is up Ad Lib. Patient also reports no difficulty with ADLs at this time and declines any need to therapy services. Will sign off at this time. Please reconsult if indicated.

## 2023-11-04 NOTE — Consults (Signed)
Session ID: [id number]  Language: Spanish  Interpreter ID: #[id number]  Interpreter Name: Nickolas Madrid

## 2023-11-05 LAB — COMPREHENSIVE METABOLIC PANEL
ALT: 107 U/L — ABNORMAL HIGH (ref 12–78)
AST: 72 U/L — ABNORMAL HIGH (ref 15–37)
Albumin/Globulin Ratio: 0.7 — ABNORMAL LOW (ref 1.1–2.2)
Albumin: 1.9 g/dL — ABNORMAL LOW (ref 3.5–5.0)
Alk Phosphatase: 207 U/L — ABNORMAL HIGH (ref 45–117)
Anion Gap: 8 mmol/L (ref 2–12)
BUN/Creatinine Ratio: 7 — ABNORMAL LOW (ref 12–20)
BUN: 13 mg/dL (ref 6–20)
CO2: 20 mmol/L — ABNORMAL LOW (ref 21–32)
Calcium: 8.1 mg/dL — ABNORMAL LOW (ref 8.5–10.1)
Chloride: 109 mmol/L — ABNORMAL HIGH (ref 97–108)
Creatinine: 1.77 mg/dL — ABNORMAL HIGH (ref 0.55–1.02)
Est, Glom Filt Rate: 37 mL/min/{1.73_m2} — ABNORMAL LOW (ref >60)
Globulin: 2.6 g/dL (ref 2.0–4.0)
Glucose: 83 mg/dL (ref 65–100)
Potassium: 4.5 mmol/L (ref 3.5–5.1)
Sodium: 137 mmol/L (ref 136–145)
Total Bilirubin: 0.3 mg/dL (ref 0.2–1.0)
Total Protein: 4.5 g/dL — ABNORMAL LOW (ref 6.4–8.2)

## 2023-11-05 LAB — POCT GLUCOSE
POC Glucose: 106 mg/dL (ref 65–117)
POC Glucose: 65 mg/dL (ref 65–117)
POC Glucose: 67 mg/dL (ref 65–117)
POC Glucose: 84 mg/dL (ref 65–117)
POC Glucose: 96 mg/dL (ref 65–117)
POC Glucose: 97 mg/dL (ref 65–117)

## 2023-11-05 LAB — PHOSPHORUS: Phosphorus: 3.8 mg/dL (ref 2.6–4.7)

## 2023-11-05 LAB — MAGNESIUM: Magnesium: 2.1 mg/dL (ref 1.6–2.4)

## 2023-11-05 MED ORDER — SENNA-DOCUSATE SODIUM 8.6-50 MG PO TABS
8.6-50 | Freq: Two times a day (BID) | ORAL | Status: DC
Start: 2023-11-05 — End: 2023-11-06
  Administered 2023-11-05 – 2023-11-06 (×3): 1 via ORAL

## 2023-11-05 MED ORDER — KETOROLAC TROMETHAMINE 15 MG/ML IJ SOLN
15 | Freq: Once | INTRAMUSCULAR | Status: AC
Start: 2023-11-05 — End: 2023-11-05
  Administered 2023-11-05: 15:00:00 15 mg via INTRAVENOUS

## 2023-11-05 MED ORDER — HYDROMORPHONE HCL PF 1 MG/ML IJ SOLN
1 | INTRAMUSCULAR | Status: DC | PRN
Start: 2023-11-05 — End: 2023-11-06
  Administered 2023-11-05 – 2023-11-06 (×5): 1 mg via INTRAVENOUS

## 2023-11-05 MED ORDER — ONDANSETRON HCL 4 MG/2ML IJ SOLN
4 | Freq: Four times a day (QID) | INTRAMUSCULAR | Status: DC | PRN
Start: 2023-11-05 — End: 2023-11-06

## 2023-11-05 MED ORDER — ALBUMIN HUMAN 25 % IV SOLN
25 | Freq: Two times a day (BID) | INTRAVENOUS | Status: DC
Start: 2023-11-05 — End: 2023-11-06
  Administered 2023-11-05: 15:00:00 25 g via INTRAVENOUS
  Administered 2023-11-06: 03:00:00 12.5 g via INTRAVENOUS
  Administered 2023-11-06: 15:00:00 25 g via INTRAVENOUS

## 2023-11-05 MED ORDER — METOCLOPRAMIDE HCL 5 MG/ML IJ SOLN
5 | Freq: Four times a day (QID) | INTRAMUSCULAR | Status: DC
Start: 2023-11-05 — End: 2023-11-06
  Administered 2023-11-05 – 2023-11-06 (×5): 5 mg via INTRAVENOUS

## 2023-11-05 MED FILL — BUSPIRONE HCL 15 MG PO TABS: 15 MG | ORAL | Qty: 1

## 2023-11-05 MED FILL — PANTOPRAZOLE SODIUM 40 MG PO TBEC: 40 MG | ORAL | Qty: 1

## 2023-11-05 MED FILL — ALBUMIN HUMAN 25 % IV SOLN: 25 % | INTRAVENOUS | Qty: 100

## 2023-11-05 MED FILL — AMITRIPTYLINE HCL 10 MG PO TABS: 10 MG | ORAL | Qty: 1

## 2023-11-05 MED FILL — METOPROLOL SUCCINATE ER 50 MG PO TB24: 50 MG | ORAL | Qty: 2

## 2023-11-05 MED FILL — HYDROMORPHONE HCL 1 MG/ML IJ SOLN: 1 MG/ML | INTRAMUSCULAR | Qty: 1

## 2023-11-05 MED FILL — CLOPIDOGREL BISULFATE 75 MG PO TABS: 75 MG | ORAL | Qty: 1

## 2023-11-05 MED FILL — ASPIRIN LOW DOSE 81 MG PO CHEW: 81 MG | ORAL | Qty: 1

## 2023-11-05 MED FILL — HYDROMORPHONE HCL 1 MG/ML IJ SOLN: 1 MG/ML | INTRAMUSCULAR | Qty: 0.5

## 2023-11-05 MED FILL — LANTUS 100 UNIT/ML SC SOLN: 100 UNIT/ML | SUBCUTANEOUS | Qty: 10

## 2023-11-05 MED FILL — KETOROLAC TROMETHAMINE 15 MG/ML IJ SOLN: 15 MG/ML | INTRAMUSCULAR | Qty: 1

## 2023-11-05 MED FILL — METOCLOPRAMIDE HCL 5 MG/ML IJ SOLN: 5 MG/ML | INTRAMUSCULAR | Qty: 2

## 2023-11-05 MED FILL — AMLODIPINE BESYLATE 5 MG PO TABS: 5 MG | ORAL | Qty: 2

## 2023-11-05 MED FILL — DULOXETINE HCL 30 MG PO CPEP: 30 MG | ORAL | Qty: 2

## 2023-11-05 MED FILL — STOOL SOFTENER/LAXATIVE 50-8.6 MG PO TABS: 50-8.6 MG | ORAL | Qty: 1

## 2023-11-05 NOTE — Progress Notes (Signed)
[place] [facility]  [address] [place], [place] [address]  [phone]        Hospitalist Progress Note      NAME: Lenoria Narine   DOB:  1981-03-22  MRM:  [id number]    Date/Time of service: 11/05/2023  8:54 AM       Subjective:     Chief Complaint:  Patient was personally seen and examined by me during this time period.  Chart reviewed.  Only tolerating clears.  Epigastric pain and nausea about the same       Objective:       Vitals:       Last 24hrs VS reviewed since prior progress note. Most recent are:    Vitals:    11/05/23 0801   BP:    Pulse:    Resp: 19   Temp:    SpO2:      SpO2 Readings from Last 6 Encounters:   11/05/23 98%   10/25/23 96%   10/14/23 98%   09/22/23 98%   09/14/23 100%   08/31/23 100%        No intake or output data in the 24 hours ending 11/05/23 0854       Exam:     Physical Exam:    Gen:  Well-developed, well-nourished, obese, mild distress  HEENT:  Pink conjunctivae, PERRL, hearing intact to voice, moist mucous membranes  Neck:  Supple, without masses, thyroid non-tender  Resp:  No accessory muscle use, clear breath sounds without wheezes rales or rhonchi  Card:  No murmurs, normal S1, S2 without thrills, trace edema  Abd:  Soft, generalized pain, non-distended, normoactive bowel sounds are present  Musc:  No cyanosis or clubbing  Skin:  No rashes  Neuro:  Cranial nerves 3-12 are grossly intact, follows commands appropriately  Psych:  poor insight, oriented to person, place and time, alert.  Spanish speaking    Medications Reviewed: (see below)    Lab Data Reviewed: (see below)    ______________________________________________________________________    Medications:     Current Facility-Administered Medications   Medication Dose Route Frequency    albumin human 25% IV solution 25 g  25 g IntraVENous Q12H    sennosides-docusate sodium (SENOKOT-S) 8.6-50 MG tablet 1 tablet  1 tablet Oral BID    metoclopramide (REGLAN) injection 5 mg  5 mg IntraVENous  Q6H    HYDROmorphone HCl PF (DILAUDID) injection 1 mg  1 mg IntraVENous Q4H PRN    ondansetron (ZOFRAN) injection 4 mg  4 mg IntraVENous Q6H PRN    ketorolac (TORADOL) injection 15 mg  15 mg IntraVENous Once    metoprolol succinate (TOPROL XL) extended release tablet 100 mg  100 mg Oral Daily    amLODIPine (NORVASC) tablet 10 mg  10 mg Oral Daily    amitriptyline (ELAVIL) tablet 10 mg  10 mg Oral Nightly    aspirin chewable tablet 81 mg  81 mg Oral Daily    busPIRone (BUSPAR) tablet 15 mg  15 mg Oral TID    clopidogrel (PLAVIX) tablet 75 mg  75 mg Oral Daily    dicyclomine (BENTYL) tablet 20 mg  20 mg Oral Q6H PRN    DULoxetine (CYMBALTA) extended release capsule 60 mg  60 mg Oral Daily    pantoprazole (PROTONIX) tablet 40 mg  40 mg Oral BID AC    sodium chloride flush 0.9 % injection 5-40 mL  5-40 mL IntraVENous 2 times per day  sodium chloride flush 0.9 % injection 5-40 mL  5-40 mL IntraVENous PRN    0.9 % sodium chloride infusion   IntraVENous PRN    acetaminophen (TYLENOL) tablet 650 mg  650 mg Oral Q6H PRN    Or    acetaminophen (TYLENOL) suppository 650 mg  650 mg Rectal Q6H PRN    melatonin tablet 6 mg  6 mg Oral Nightly PRN    traZODone (DESYREL) tablet 50 mg  50 mg Oral Nightly PRN    insulin glargine (LANTUS) injection vial 10 Units  10 Units SubCUTAneous Daily    insulin lispro (HUMALOG,ADMELOG) injection vial 0-4 Units  0-4 Units SubCUTAneous 4x Daily AC & HS    capsaicin (ZOSTRIX) 0.025 % cream   Topical BID    hydrALAZINE (APRESOLINE) injection 10 mg  10 mg IntraVENous Q6H PRN    prochlorperazine (COMPAZINE) injection 10 mg  10 mg IntraVENous Q6H PRN    traMADol (ULTRAM) tablet 50 mg  50 mg Oral Q6H PRN    acetaminophen (TYLENOL) tablet 650 mg  650 mg Oral Q4H PRN    glucose chewable tablet 16 g  4 tablet Oral PRN    dextrose bolus 10% 125 mL  125 mL IntraVENous PRN    Or    dextrose bolus 10% 250 mL  250 mL IntraVENous PRN    glucagon injection 1 mg  1 mg SubCUTAneous PRN    dextrose 10 % infusion    IntraVENous Continuous PRN          Lab Review:     Recent Labs     11/03/23  0146 11/04/23  0621   WBC 5.9 6.9   HGB 7.8* 8.7*   HCT 24.5* 27.0*   PLT 403* 424*     Recent Labs     11/03/23  0146 11/04/23  0621 11/05/23  0410   NA 138 138 137   K 3.2* 3.8 4.5   CL 109* 111* 109*   CO2 26 22 20*   BUN 16 13 13    MG  --  1.4* 2.1   PHOS  --  3.2 3.8   ALT 69 110* 107*     No results found for: "GLUCPOC"       Assessment / Plan:     43 yo hx of HTN, DM, CKD 3, gastroparesis, cholecystectomy, 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  Unclear etiology.  Likely due to gastroparesis, THC abuse, chronic pancreatitis, or bowel edema.  Will schedule IV antiemetics, PPI.  Use IV dilaudid prn.  GI was following    2) AKI/CKD 3: Cr slowly improving.  Likely due to volume depletion, malnutrition.  Will cont to hold ACEi, metformin.  Will use IV albumin for volume expansion    3) Severe pro-cal malnutrition: albumin 1.9.  nutrition following.  Encourage PO.  Might need PPN if unable to advance diet    4) Elevated LFTs: mild, likely due to N/V.  Will monitor     5) Anemia: chronic, likely dilutional.  No bleeding.  Will monitor Hgb    6) HTN: cont norvasc, metoprolol.  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  hold metformin.  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  Cont ASA, plavix  10) HypoMg: replete IV, monitor     **Prior records, notes, labs, radiology, and medications reviewed in [facility]**    Total time spent with patient care: 37 Minutes **I personally saw and examined the patient during this time period**                 Care Plan discussed with: Patient, nursing     Discussed:  Care Plan    Prophylaxis:  SCD's    Disposition:  Home  w/Family           ___________________________________________________    Attending Physician: Paulo Fruit, MD

## 2023-11-05 NOTE — Progress Notes (Signed)
Patient's blood glucose was 65. Primary nurse gave two cups of apple juice. Rechecked.    Upon rechecking patient's blood glucose, it was 67. PCT gave patient two orange juices, and patient tolerated. Rechecked    Patient's blood glucose is now at 97.     MD made aware.

## 2023-11-06 LAB — COMPREHENSIVE METABOLIC PANEL
ALT: 76 U/L (ref 12–78)
AST: 42 U/L — ABNORMAL HIGH (ref 15–37)
Albumin/Globulin Ratio: 1 — ABNORMAL LOW (ref 1.1–2.2)
Albumin: 2.4 g/dL — ABNORMAL LOW (ref 3.5–5.0)
Alk Phosphatase: 168 U/L — ABNORMAL HIGH (ref 45–117)
Anion Gap: 7 mmol/L (ref 2–12)
BUN/Creatinine Ratio: 7 — ABNORMAL LOW (ref 12–20)
BUN: 12 mg/dL (ref 6–20)
CO2: 21 mmol/L (ref 21–32)
Calcium: 8.2 mg/dL — ABNORMAL LOW (ref 8.5–10.1)
Chloride: 109 mmol/L — ABNORMAL HIGH (ref 97–108)
Creatinine: 1.68 mg/dL — ABNORMAL HIGH (ref 0.55–1.02)
Est, Glom Filt Rate: 39 mL/min/{1.73_m2} — ABNORMAL LOW (ref >60)
Globulin: 2.5 g/dL (ref 2.0–4.0)
Glucose: 68 mg/dL (ref 65–100)
Potassium: 3.7 mmol/L (ref 3.5–5.1)
Sodium: 137 mmol/L (ref 136–145)
Total Bilirubin: 0.4 mg/dL (ref 0.2–1.0)
Total Protein: 4.9 g/dL — ABNORMAL LOW (ref 6.4–8.2)

## 2023-11-06 LAB — POCT GLUCOSE
POC Glucose: 131 mg/dL — ABNORMAL HIGH (ref 65–117)
POC Glucose: 77 mg/dL (ref 65–117)
POC Glucose: 104 mg/dL (ref 65–117)

## 2023-11-06 LAB — PHOSPHORUS: Phosphorus: 3.8 mg/dL (ref 2.6–4.7)

## 2023-11-06 LAB — MAGNESIUM: Magnesium: 1.9 mg/dL (ref 1.6–2.4)

## 2023-11-06 MED ORDER — METOCLOPRAMIDE HCL 10 MG PO TABS
10 | ORAL_TABLET | Freq: Three times a day (TID) | ORAL | 1 refills | 22.50000 days | Status: DC
Start: 2023-11-06 — End: 2024-02-23

## 2023-11-06 MED ORDER — TRAMADOL HCL 50 MG PO TABS
50 | ORAL_TABLET | Freq: Four times a day (QID) | ORAL | 0 refills | Status: AC | PRN
Start: 2023-11-06 — End: 2023-11-13

## 2023-11-06 MED ORDER — SENNA-DOCUSATE SODIUM 8.6-50 MG PO TABS
8.6-50 | ORAL_TABLET | Freq: Two times a day (BID) | ORAL | 1 refills | 20.00000 days | Status: DC
Start: 2023-11-06 — End: 2024-08-28

## 2023-11-06 MED ORDER — KETOROLAC TROMETHAMINE 15 MG/ML IJ SOLN
15 | Freq: Once | INTRAMUSCULAR | Status: AC
Start: 2023-11-06 — End: 2023-11-06
  Administered 2023-11-06: 15:00:00 15 mg via INTRAVENOUS

## 2023-11-06 MED ORDER — ONDANSETRON 4 MG PO TBDP
4 | ORAL_TABLET | Freq: Three times a day (TID) | ORAL | 0 refills | Status: DC | PRN
Start: 2023-11-06 — End: 2024-08-28

## 2023-11-06 MED FILL — METOPROLOL SUCCINATE ER 50 MG PO TB24: 50 MG | ORAL | Qty: 2

## 2023-11-06 MED FILL — BUSPIRONE HCL 15 MG PO TABS: 15 MG | ORAL | Qty: 1

## 2023-11-06 MED FILL — STOOL SOFTENER/LAXATIVE 50-8.6 MG PO TABS: 50-8.6 MG | ORAL | Qty: 1

## 2023-11-06 MED FILL — ALBUMIN HUMAN 25 % IV SOLN: 25 % | INTRAVENOUS | Qty: 100

## 2023-11-06 MED FILL — METOCLOPRAMIDE HCL 5 MG/ML IJ SOLN: 5 MG/ML | INTRAMUSCULAR | Qty: 2

## 2023-11-06 MED FILL — HYDROMORPHONE HCL 1 MG/ML IJ SOLN: 1 MG/ML | INTRAMUSCULAR | Qty: 1

## 2023-11-06 MED FILL — AMITRIPTYLINE HCL 10 MG PO TABS: 10 MG | ORAL | Qty: 1

## 2023-11-06 MED FILL — MELATONIN 3 MG PO TABS: 3 MG | ORAL | Qty: 2

## 2023-11-06 MED FILL — ASPIRIN LOW DOSE 81 MG PO CHEW: 81 MG | ORAL | Qty: 1

## 2023-11-06 MED FILL — PANTOPRAZOLE SODIUM 40 MG PO TBEC: 40 MG | ORAL | Qty: 1

## 2023-11-06 MED FILL — DULOXETINE HCL 30 MG PO CPEP: 30 MG | ORAL | Qty: 2

## 2023-11-06 MED FILL — CLOPIDOGREL BISULFATE 75 MG PO TABS: 75 MG | ORAL | Qty: 1

## 2023-11-06 MED FILL — AMLODIPINE BESYLATE 5 MG PO TABS: 5 MG | ORAL | Qty: 2

## 2023-11-06 MED FILL — KETOROLAC TROMETHAMINE 15 MG/ML IJ SOLN: 15 MG/ML | INTRAMUSCULAR | Qty: 1

## 2023-11-06 NOTE — Discharge Summary (Signed)
[facility]  [address] [place], [place] [address]  [phone]          Hospitalist Discharge Summary     Patient ID:  Melinda [name]  [id number]  43 y.o.  11-25-1980    Admit date: 10/31/2023    Discharge date and time: 11/06/2023 1:38 PM    Admission Diagnoses: AKI (acute kidney injury) (HCC) [N17.9]  Intractable nausea and vomiting [R11.2]  Gastroparesis [K31.84]    Discharge Diagnoses:  Principal Diagnosis Intractable nausea and vomiting                                            Principal Problem:    Intractable nausea and vomiting  Active Problems:    Abdominal pain    CKD stage 3 due to type 2 diabetes mellitus (HCC)    Gastroparesis  Resolved Problems:    * No resolved hospital problems. 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  Unclear etiology.  Likely due to gastroparesis, THC abuse, chronic pancreatitis, or bowel edema.  Will advance diet as tolerated.  Cont scheduled reglan.  Will cont antiemetics, PPI.  GI was following     2) AKI/CKD 3: Cr now stable.  Likely due to volume depletion, malnutrition.  Patient received IV albumin for volume expansion     3) Severe pro-cal malnutrition: albumin 1.9.  nutrition following.  Encourage PO.  Received IV albumin while here      4) Elevated LFTs: mild, likely due to N/V, now improving     5) Anemia: chronic, likely dilutional.  No bleeding     6) HTN: cont norvasc, metoprolol.  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  Has hypoglycemia due to poor intake.  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  Cont ASA, plavix     10) HypoMg: repleted IV    PCP: Idowu, Funmilola, APRN - NP      Consults: GI    Significant Diagnostic Studies: none    Discharge Exam:  Physical Exam:    See daily note     Disposition: home  Discharge Condition: Stable    Patient Instructions:      Medication List        START taking these medications      sennosides-docusate sodium 8.6-50 MG tablet  Commonly known as: SENOKOT-S  Take 1 tablet by mouth in the morning and at bedtime     traMADol 50 MG tablet  Commonly known as: ULTRAM  Take 1 tablet by mouth every 6 hours as needed for Pain for up to 7 days. Max Daily Amount: 200 mg            CHANGE how you take these medications      metoclopramide 10 MG tablet  Commonly known as: Reglan  Take 1 tablet by mouth in the morning, at noon, and at bedtime  What changed:  when to take this  reasons to take this            CONTINUE taking these medications      amitriptyline 10 MG tablet  Commonly known as: ELAVIL  Take 1 tablet by mouth nightly     amLODIPine 10 MG tablet  Commonly known as: NORVASC  Take 1 tablet by mouth daily     aspirin 81 MG chewable tablet  Take 1 tablet by mouth daily     busPIRone 15 MG tablet  Commonly known as: BUSPAR  Take 15 mg by mouth 3 times daily     clopidogrel 75 MG tablet  Commonly known as: PLAVIX  Take 1 tablet by mouth daily     dicyclomine 20 MG tablet  Commonly known as: BENTYL  Take 1 tablet by mouth every 6 hours as needed (abdominal cramping)     DULoxetine 60 MG extended release capsule  Commonly known as: CYMBALTA  Take 1 capsule by mouth daily     linaclotide 290 MCG Caps capsule  Commonly known as: LINZESS  Take 1 capsule by mouth every morning (before breakfast)     lisinopril 40 MG tablet  Commonly known as: PRINIVIL;ZESTRIL  Take 1 tablet by mouth daily     metFORMIN 500 MG extended release tablet  Commonly known as: GLUCOPHAGE-XR  Take 2 tablets by mouth daily (with breakfast)     metoprolol succinate 50 MG extended release tablet  Commonly known as: TOPROL XL  Take 1 tablet by mouth daily     ondansetron 4 MG disintegrating  tablet  Commonly known as: ZOFRAN-ODT  Take 1 tablet by mouth every 8 hours as needed for Nausea or Vomiting     pantoprazole 40 MG tablet  Commonly known as: PROTONIX  Take 1 tablet by mouth 2 times daily (before meals)     polyethylene glycol 17 GM/SCOOP powder  Commonly known as: GLYCOLAX  Take 17 g by mouth 2 times daily     prochlorperazine 10 MG tablet  Commonly known as: COMPAZINE  Take 1 tablet by mouth every 6 hours as needed (nausea or vomiting)            STOP taking these medications      cloNIDine 0.2 MG/24HR Ptwk  Commonly known as: CATAPRES               Where to Get Your Medications        These medications were sent to [facility] [address] [place] [address] [place] [phone] - F [phone]  [address], [place] [address]      Phone: [phone]   metoclopramide 10 MG tablet  ondansetron 4 MG disintegrating tablet  sennosides-docusate sodium 8.6-50 MG tablet  traMADol 50 MG tablet       Activity: activity as tolerated  Diet: low fat, low cholesterol diet  Wound Care: none needed    Follow-up with  Orlean Bradford, APRN - NP  [address]  [place] [address]  [phone]    Schedule an appointment as soon as possible for a visit in 1 week(s)      Leta Speller, MD  [address]  [place] [address]  [phone]    Schedule an appointment as soon as possible for a visit in 2 week(s)        Follow-up tests/labs none    Signed:  Paulo Fruit, MD  11/06/2023  1:38 PM  **I  personally spent 35 min on discharge**

## 2023-11-06 NOTE — Discharge Instructions (Signed)
HOSPITALIST DISCHARGE INSTRUCTIONS          NAME: Melinda [name]   DOB:  [birth year]   MRN:  [id number]     Date/Time:  11/06/2023 1:30 PM    ADMIT DATE: 10/31/2023     DISCHARGE DATE: 11/06/2023     ADMITTING DIAGNOSIS:  Intractable nausea, vomiting, gastroparesis    DISCHARGE DIAGNOSIS:  same    MEDICATIONS:  @AVSMEDLIST @     It is important that you take the medication exactly as they are prescribed.   Keep your medication in the bottles provided by the pharmacist and keep a list of the medication names, dosages, and times to be taken in your wallet.   Jayanth Szczesniak not take other medications without consulting your doctor     Pain Management: per above medications    What to [place] at Home    Recommended diet:   bland diet.  Avoid foods high in fat.  Stop smoking Marijuana     Recommended activity: activity as tolerated    1) Return to the hospital if you feel worse    2) If you experience any of the following symptoms then please call your primary care physician or return to the emergency room if you cannot get hold of your doctor:  Fever, chills, nausea, vomiting, diarrhea, change in mentation, falling, bleeding, shortness of breath, chest pain, severe headache, severe abdominal pain,     3) Follow up with your doctors as directed    Follow Up:  Orlean Bradford, APRN - NP  [address]  [place] [address]  [phone]    Schedule an appointment as soon as possible for a visit in 1 week(s)      Leta Speller, MD  [address]  [place] [address]  [phone]    Schedule an appointment as soon as possible for a visit in 2 week(s)    .      Information obtained by :  I understand that if any problems occur once I am at home I am to contact my physician.    I understand and acknowledge receipt of the instructions indicated above.                                                                                                                                           Physician's or R.N.'s  Signature                                                                  Date/Time  Patient or Representative Signature                                                          Date/Time         Gastroparesia: Instrucciones de cuidado  Gastroparesis: Care Instructions  Generalidades     Cuando una persona tiene gastroparesia, su estmago tarda mucho ms en vaciarse. Esta demora puede causar dolor abdominal, abotagamiento y eructos. Tambin puede provocar hipo, acidez estomacal, nuseas o vmito. Es posible que no tenga ganas de comer. Estos sntomas podran aparecer y Geneticist, molecular. Se presentan con ms frecuencia durante y despus de las comidas. Usted puede sentirse satisfecho despus de comer apenas unos pocos bocados de comida.  Esta afeccin se produce cuando los nervios o los msculos del estmago no funcionan correctamente. La diabetes es una de las causas ms comunes de este dao nervioso. La gastroparesia puede causar dificultades para controlar los niveles de [place]. Pero [employer] niveles de azcar en la sangre bajo control puede ayudar con los sntomas. La enfermedad de Parkinson, un ataque cerebral y algunos medicamentos tambin pueden provocar esta afeccin.  Con frecuencia, el tratamiento en el hogar puede ayudar.  La atencin de seguimiento es una parte clave de su tratamiento y seguridad. Asegrese de Radio producer y acudir a todas las citas, y llame a su mdico si est teniendo problemas. Tambin es una buena idea saber los resultados de sus exmenes y Pharmacologist una lista de los medicamentos que toma.  Cmo puede cuidarse en el hogar?  Coma pequeas cantidades de comida varias veces al da, en lugar de comer tres comidas grandes.  Coma alimentos con bajo contenido de [place] y [place]  Si el mdico se lo sugiere, tome medicamentos que ayuden al [employer] a  vaciarse ms rpidamente. Estos se llaman agentes de motilidad.  Cundo debe pedir ayuda?   Llame a su mdico ahora mismo o busque atencin mdica inmediata si:    Tiene vmito.     Tiene dolor abdominal nuevo o ms intenso.     Tiene fiebre.     No puede evacuar heces ni gases.   Preste especial atencin a los cambios en su salud y asegrese de comunicarse con su mdico si tiene algn problema.  Dnde puede encontrar ms informacin?  Vaya a [email or web address] e escriba M106 para ms informacin sobre "Gastroparesia: Instrucciones de cuidado."  Revisado: 19 octubre, 2023  Versin del contenido: 14.3   2024 [employer], [place].   Las instrucciones de cuidado fueron adaptadas bajo licencia por [employer]. Si usted tiene preguntas sobre una afeccin mdica o sobre estas instrucciones, siempre pregunte a su profesional de salud. [employer], niega toda garanta o responsabilidad por su uso de esta informacin.         Nuseas y vmito: Instrucciones de cuidado  Nausea and Vomiting: Care Instructions  Instrucciones de cuidado     Cuando tiene nuseas, podra sentirse dbil y sudoroso y notar mucha saliva en su boca. Las nuseas suelen provocar el vmito. La mayora de las veces no hay que preocuparse por las nuseas y el vmito, pero estos pueden ser signos de [employer].  Dos causas comunes de nuseas y vmito son la gastroenteritis viral y la intoxicacin por alimentos. Las nuseas y el vmito por gastroenteritis viral, por  lo general, empiezan a mejorar en unas 24 horas. Las nuseas y el vmito debido a intoxicacin por alimentos podran durar de 12 a 48 horas.  El mdico lo ha revisado minuciosamente, pero puede desarrollar problemas ms tarde. Si nota algn problema o sntomas nuevos, busque tratamiento mdico inmediatamente.  La atencin de seguimiento es una parte clave de su tratamiento y seguridad. Asegrese de Radio producer y acudir a todas las citas, y llame a su  mdico si est teniendo problemas. Tambin es una buena idea saber los resultados de sus exmenes y Pharmacologist una lista de los medicamentos que toma.  Cmo puede cuidarse en el hogar?  Beba mucho lquido para Agricultural engineer. Opte por beber agua y otros lquidos claros hasta que se sienta mejor. Si tiene una enfermedad renal, cardaca o heptica y tiene que restringir los lquidos, hable con su mdico antes de aumentar la cantidad de lquido que bebe.  Permanezca en la cama hasta que se sienta mejor.  Cuando pueda comer, empiece a consumir sopas claras (caldos), alimentos suaves y lquidos hasta que todos los sntomas hayan desaparecido por un perodo de 12 a 48 horas. Otras elecciones buenas incluyen pan tostado seco, galletas saladas, cereal cocido y postre de gelatina, como Jell-O.  Cundo debe pedir ayuda?   Llame al 911 en cualquier momento que considere que necesita atencin de [place]. Por ejemplo, llame si:    Se desmay (perdi el conocimiento).   Llame a su mdico ahora mismo o busque atencin mdica inmediata si:    Tiene sntomas de deshidratacin, tales como:  Ojos secos y boca seca.  Orinar muy poco.  Tener ms sed de lo habitual.     Tiene dolor abdominal nuevo o que empeora.     Tiene fiebre nueva o que [place].     Vomita sangre o algo parecido a granos de caf molido.   Preste especial atencin a los cambios en su salud y asegrese de comunicarse con su mdico si:    Tiene nusea y vmito continuos.     El vmito est empeorando.     El vmito dura ms de 2 das.     No mejora como se esperaba.   Dnde puede encontrar ms informacin?  Vaya a [email or web address] e escriba H591 para ms informacin sobre "Nuseas y vmito: Instrucciones de cuidado."  Revisado: 19 octubre, 2023  Versin del contenido: 14.3   2024 [employer], [employer].   Las instrucciones de cuidado fueron adaptadas bajo licencia por [employer]. Si usted tiene preguntas sobre una afeccin  mdica o sobre estas instrucciones, siempre pregunte a su profesional de salud. [employer], niega toda garanta o responsabilidad por su uso de esta informacin.

## 2023-11-06 NOTE — Progress Notes (Signed)
[facility]  [address] [place], [place] [address]  [phone]        Hospitalist Progress Note      NAME: Melinda [name]   DOB:  [birth year]  MRM:  [id number]    Date/Time of service: 11/06/2023  9:04 AM       Subjective:     Chief Complaint:  Patient was personally seen and examined by me during this time period.  Chart reviewed.  Abd pain improving.  No N/V.  Wanted to advance diet        Objective:       Vitals:       Last 24hrs VS reviewed since prior progress note. Most recent are:    Vitals:    11/06/23 0651   BP: (!) 158/94   Pulse: 83   Resp: 16   Temp: 98.4 F (36.9 C)   SpO2: 96%     SpO2 Readings from Last 6 Encounters:   11/06/23 96%   10/25/23 96%   10/14/23 98%   09/22/23 98%   09/14/23 100%   08/31/23 100%        No intake or output data in the 24 hours ending 11/06/23 0904       Exam:     Physical Exam:    Gen:  Well-developed, well-nourished, obese, NAD  HEENT:  Pink conjunctivae, PERRL, hearing intact to voice, moist mucous membranes  Neck:  Supple, without masses, thyroid non-tender  Resp:  No accessory muscle use, clear breath sounds without wheezes rales or rhonchi  Card:  No murmurs, normal S1, S2 without thrills, trace edema  Abd:  Soft, minimal pain, non-distended, normoactive bowel sounds are present  Musc:  No cyanosis or clubbing  Skin:  No rashes  Neuro:  Cranial nerves 3-12 are grossly intact, follows commands appropriately  Psych:  poor insight, oriented to person, place and time, alert.  Spanish speaking    Medications Reviewed: (see below)    Lab Data Reviewed: (see below)    ______________________________________________________________________    Medications:     Current Facility-Administered Medications   Medication Dose Route Frequency    ketorolac (TORADOL) injection 15 mg  15 mg IntraVENous Once    albumin human 25% IV solution 25 g  25 g IntraVENous Q12H    sennosides-docusate sodium (SENOKOT-S) 8.6-50 MG tablet 1 tablet  1 tablet  Oral BID    metoclopramide (REGLAN) injection 5 mg  5 mg IntraVENous Q6H    HYDROmorphone HCl PF (DILAUDID) injection 1 mg  1 mg IntraVENous Q4H PRN    ondansetron (ZOFRAN) injection 4 mg  4 mg IntraVENous Q6H PRN    metoprolol succinate (TOPROL XL) extended release tablet 100 mg  100 mg Oral Daily    amLODIPine (NORVASC) tablet 10 mg  10 mg Oral Daily    amitriptyline (ELAVIL) tablet 10 mg  10 mg Oral Nightly    aspirin chewable tablet 81 mg  81 mg Oral Daily    busPIRone (BUSPAR) tablet 15 mg  15 mg Oral TID    clopidogrel (PLAVIX) tablet 75 mg  75 mg Oral Daily    dicyclomine (BENTYL) tablet 20 mg  20 mg Oral Q6H PRN    DULoxetine (CYMBALTA) extended release capsule 60 mg  60 mg Oral Daily    pantoprazole (PROTONIX) tablet 40 mg  40 mg Oral BID AC    sodium chloride flush 0.9 % injection 5-40 mL  5-40 mL IntraVENous 2  times per day    sodium chloride flush 0.9 % injection 5-40 mL  5-40 mL IntraVENous PRN    0.9 % sodium chloride infusion   IntraVENous PRN    acetaminophen (TYLENOL) tablet 650 mg  650 mg Oral Q6H PRN    Or    acetaminophen (TYLENOL) suppository 650 mg  650 mg Rectal Q6H PRN    melatonin tablet 6 mg  6 mg Oral Nightly PRN    traZODone (DESYREL) tablet 50 mg  50 mg Oral Nightly PRN    [Held by provider] insulin glargine (LANTUS) injection vial 10 Units  10 Units SubCUTAneous Daily    insulin lispro (HUMALOG,ADMELOG) injection vial 0-4 Units  0-4 Units SubCUTAneous 4x Daily AC & HS    capsaicin (ZOSTRIX) 0.025 % cream   Topical BID    hydrALAZINE (APRESOLINE) injection 10 mg  10 mg IntraVENous Q6H PRN    prochlorperazine (COMPAZINE) injection 10 mg  10 mg IntraVENous Q6H PRN    traMADol (ULTRAM) tablet 50 mg  50 mg Oral Q6H PRN    acetaminophen (TYLENOL) tablet 650 mg  650 mg Oral Q4H PRN    glucose chewable tablet 16 g  4 tablet Oral PRN    dextrose bolus 10% 125 mL  125 mL IntraVENous PRN    Or    dextrose bolus 10% 250 mL  250 mL IntraVENous PRN    glucagon injection 1 mg  1 mg SubCUTAneous PRN     dextrose 10 % infusion   IntraVENous Continuous PRN          Lab Review:     Recent Labs     11/04/23  0621   WBC 6.9   HGB 8.7*   HCT 27.0*   PLT 424*     Recent Labs     11/04/23  0621 11/05/23  0410 11/06/23  0325   NA 138 137 137   K 3.8 4.5 3.7   CL 111* 109* 109*   CO2 22 20* 21   BUN 13 13 12    MG 1.4* 2.1 1.9   PHOS 3.2 3.8 3.8   ALT 110* 107* 76     No results found for: "GLUCPOC"       Assessment / Plan:     43 yo hx of HTN, DM, CKD 3, gastroparesis, cholecystectomy, 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  Unclear etiology.  Likely due to gastroparesis, THC abuse, chronic pancreatitis, or bowel edema.  Will advance diet as tolerated.  Will cont IV antiemetics, PPI.  Use IV dilaudid prn.  GI was following    2) AKI/CKD 3: Cr now stable.  Likely due to volume depletion, malnutrition.  Will cont to hold ACEi, metformin.  Will cont IV albumin for volume expansion    3) Severe pro-cal malnutrition: albumin 1.9.  nutrition following.  Encourage PO.  Might need PPN if unable to advance diet    4) Elevated LFTs: mild, likely due to N/V, now improving.  Will monitor     5) Anemia: chronic, likely dilutional.  No bleeding.  Will monitor Hgb    6) HTN: cont norvasc, metoprolol.  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  Has hypoglycemia.  Hold metformin, Lantus.  Cont SSI    8) Depression: cont cymbalta, amitriptyline     9) PAD: recent distal SMA stenosis  s/p vascular stenting 12/23.  Cont ASA, plavix    10) HypoMg: replete IV prn, monitor     **Prior records, notes, labs, radiology, and medications reviewed in [facility]**    Total time spent with patient care: 79 Minutes **I personally saw and examined the patient during this time period**                 Care Plan discussed with: Patient, nursing     Discussed:  Care Plan    Prophylaxis:  SCD's    Disposition:  Home  w/Family           ___________________________________________________    Attending Physician: Paulo Fruit, MD

## 2023-11-06 NOTE — Plan of Care (Signed)
Problem: Pain  Goal: Verbalizes/displays adequate comfort level or baseline comfort level  Outcome: Progressing     Problem: Skin/Tissue Integrity  Goal: Absence of new skin breakdown  Description: 1.  Monitor for areas of redness and/or skin breakdown  2.  Assess vascular access sites hourly  3.  Every 4-6 hours minimum:  Change oxygen saturation probe site  4.  Every 4-6 hours:  If on nasal continuous positive airway pressure, respiratory therapy assess nares and determine need for appliance change or resting period.  Outcome: Progressing     Problem: Safety - Adult  Goal: Free from fall injury  Outcome: Progressing     Problem: Chronic Conditions and Co-morbidities  Goal: Patient's chronic conditions and co-morbidity symptoms are monitored and maintained or improved  Outcome: Progressing

## 2023-11-11 LAB — PORPHYRINS, PLASMA, FRACTIONATED
Coproporphyrin: <1 ug/dL (ref <1.0)
Heptaporphyrin: <1 ug/dL (ref <1.0)
Hexaporphyrin: <1.5 ug/dL (ref <1.5)
Pentaporphyrin: <1 ug/dL (ref <1.0)
Protoporphyrin: <1.5 ug/dL (ref <1.5)
Uroporphyrin: <1.5 ug/dL (ref <1.5)

## 2023-11-14 ENCOUNTER — Ambulatory Visit: Payer: MEDICAID | Attending: Family Medicine | Primary: Adult Health

## 2023-11-28 ENCOUNTER — Inpatient Hospital Stay: Admit: 2023-11-28 | Discharge: 2023-11-28 | Disposition: A | Discharge status: Home / Self Care | Payer: MEDICAID

## 2023-11-28 DIAGNOSIS — G8929 Other chronic pain: Secondary | ICD-10-CM

## 2023-11-28 DIAGNOSIS — F1210 Cannabis abuse, uncomplicated: Secondary | ICD-10-CM

## 2023-11-28 LAB — URINALYSIS WITH MICROSCOPIC
Bilirubin, Urine: NEGATIVE
Glucose, Ur: >1000 mg/dL — AB
Ketones, Urine: NEGATIVE mg/dL
Nitrite, Urine: NEGATIVE
Protein, UA: >300 mg/dL — AB
Specific Gravity, UA: 1.016 (ref 1.003–1.030)
Urobilinogen, Urine: 0.2 U/dL (ref 0.2–1.0)
pH, Urine: 7 (ref 5.0–8.0)

## 2023-11-28 LAB — URINE DRUG SCREEN
Amphetamine, Urine: NEGATIVE
Barbiturates, Urine: NEGATIVE
Benzodiazepines, Urine: NEGATIVE
Cocaine, Urine: NEGATIVE
Methadone, Urine: NEGATIVE
Opiates, Urine: NEGATIVE
Phencyclidine, Urine: NEGATIVE
THC, TH-Cannabinol, Urine: POSITIVE — AB

## 2023-11-28 LAB — POC PREGNANCY UR-QUAL: Preg Test, Ur: NEGATIVE

## 2023-11-28 LAB — POCT GLUCOSE: POC Glucose: 237 mg/dL — ABNORMAL HIGH (ref 65–117)

## 2023-11-28 MED ORDER — DIAZEPAM 5 MG/ML IJ SOLN
5 | Freq: Once | INTRAMUSCULAR | Status: DC
Start: 2023-11-28 — End: 2023-11-28

## 2023-11-28 MED ORDER — ONDANSETRON 4 MG PO TBDP
4 | Freq: Once | ORAL | Status: AC
Start: 2023-11-28 — End: 2023-11-28
  Administered 2023-11-28: 23:00:00 4 mg via ORAL

## 2023-11-28 MED ORDER — ONDANSETRON HCL 4 MG/2ML IJ SOLN
42 | Freq: Once | INTRAMUSCULAR | Status: DC
Start: 2023-11-28 — End: 2023-11-28

## 2023-11-28 MED ORDER — SODIUM CHLORIDE 0.9 % IV BOLUS
0.9 | Freq: Once | INTRAVENOUS | Status: DC
Start: 2023-11-28 — End: 2023-11-28

## 2023-11-28 MED ORDER — HYDROMORPHONE HCL PF 1 MG/ML IJ SOLN
1 | INTRAMUSCULAR | Status: AC
Start: 2023-11-28 — End: 2023-11-28
  Administered 2023-11-28: 22:00:00 1 mg via INTRAMUSCULAR

## 2023-11-28 MED ORDER — DROPERIDOL 2.5 MG/ML IJ SOLN
2.5 | Freq: Four times a day (QID) | INTRAMUSCULAR | Status: DC | PRN
Start: 2023-11-28 — End: 2023-11-28

## 2023-11-28 MED ORDER — DIAZEPAM 5 MG PO TABS
5 | Freq: Once | ORAL | Status: AC
Start: 2023-11-28 — End: 2023-11-28
  Administered 2023-11-28: 21:00:00 5 mg via ORAL

## 2023-11-28 MED ORDER — ONDANSETRON 4 MG PO TBDP
4 | Freq: Once | ORAL | Status: AC
Start: 2023-11-28 — End: 2023-11-28
  Administered 2023-11-28: 21:00:00 4 mg via ORAL

## 2023-11-28 MED FILL — ONDANSETRON 4 MG PO TBDP: 4 MG | ORAL | Qty: 1

## 2023-11-28 MED FILL — DIAZEPAM 5 MG PO TABS: 5 MG | ORAL | Qty: 1

## 2023-11-28 MED FILL — DROPERIDOL 2.5 MG/ML IJ SOLN: 2.5 MG/ML | INTRAMUSCULAR | Qty: 0.5

## 2023-11-28 MED FILL — HYDROMORPHONE HCL 1 MG/ML IJ SOLN: 1 MG/ML | INTRAMUSCULAR | Qty: 1

## 2023-11-28 MED FILL — SODIUM CHLORIDE 0.9 % IV SOLN: 0.9 % | INTRAVENOUS | Qty: 1000

## 2023-11-28 NOTE — ED Notes (Signed)
[facility]IV trained RN attempted IV start x2. Vascular RN called at this time

## 2023-11-28 NOTE — ED Notes (Signed)
Patient ambulatory at discharge. Discharge instructions reviewed by MD.

## 2023-11-28 NOTE — ED Provider Notes (Signed)
[facility] EMERGENCY DEPARTMENT  EMERGENCY DEPARTMENT ENCOUNTER      Pt Name: Melinda [name]  MRN: [id number]  Birthdate [birth year]  Date of evaluation: 11/28/2023  Provider: Remi Deter, DO    CHIEF COMPLAINT       Chief Complaint   Patient presents with    Abdominal Pain         HISTORY OF PRESENT ILLNESS   (Location/Symptom, Timing/Onset, Context/Setting, Quality, Duration, Modifying Factors, Severity)  Note limiting factors.   HPI      Review of External Medical Records:     Nursing Notes were reviewed.    REVIEW OF SYSTEMS    (2-9 systems for level 4, 10 or more for level 5)     Review of Systems    Except as noted above the remainder of the review of systems was reviewed and negative.       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)     Gastroparesis     Gastroparesis     GERD (gastroesophageal reflux disease)     High cholesterol     Hypertension          SURGICAL HISTORY       Past Surgical History:   Procedure Laterality Date    CAPSULE ENDOSCOPY N/A 01/20/2023    ESOPHAGEAL CAPSULE ENDOSCOPY remove at 1624PM performed by Glyn Ade, MD at [facility] ENDOSCOPY    CHOLECYSTECTOMY, LAPAROSCOPIC N/A 02/03/2023    ROBOTIC LAPAROSCOPIC CHOLECYSTECTOMY with Indocyanine green performed by Verdis Frederickson, MD at [facility] MAIN OR    COLONOSCOPY N/A 01/19/2023    COLONOSCOPY DIAGNOSTIC performed by Glyn Ade, MD at [facility] ENDOSCOPY    INVASIVE VASCULAR N/A 10/03/2023    Angiography visceral SMA performed by Natasha Mead, MD at [facility] CARDIAC CATH LAB    INVASIVE VASCULAR N/A 10/03/2023    Ultrasound guided vascular access performed by Natasha Mead, MD at [facility] CARDIAC CATH LAB    INVASIVE VASCULAR N/A 10/03/2023    Insert stent peripheral artery performed by Natasha Mead, MD at [facility] CARDIAC CATH LAB    IR NONTUNNELED VASCULAR CATHETER > 5 YEARS  10/10/2023    IR NONTUNNELED VASCULAR CATHETER >  5 YEARS 10/10/2023 [facility] CARDIAC CATH/EP/IR LAB    IR NONTUNNELED VASCULAR CATHETER > 5 YEARS  10/10/2023    IR NONTUNNELED VASCULAR CATHETER > 5 YEARS 10/10/2023 [facility] CARDIAC CATH/EP/IR LAB    OTHER SURGICAL HISTORY Left     Rentia attachment    TUBAL LIGATION Bilateral     UPPER GASTROINTESTINAL ENDOSCOPY N/A 01/17/2023    ESOPHAGOGASTRODUODENOSCOPY performed by Glyn Ade, MD at [facility] ENDOSCOPY    UPPER GASTROINTESTINAL ENDOSCOPY N/A 01/17/2023    ESOPHAGOGASTRODUODENOSCOPY BIOPSY performed by Glyn Ade, MD at [facility] ENDOSCOPY    UPPER GASTROINTESTINAL ENDOSCOPY N/A 01/18/2023    ESOPHAGOGASTRODUODENOSCOPY performed by Glyn Ade, MD at [facility] ENDOSCOPY    UPPER GASTROINTESTINAL ENDOSCOPY N/A 05/13/2023    ESOPHAGOGASTRODUODENOSCOPY performed by Leta Speller, MD at [facility] ENDOSCOPY    UPPER GASTROINTESTINAL ENDOSCOPY N/A 05/13/2023    ESOPHAGOGASTRODUODENOSCOPY BIOPSY performed by Leta Speller, MD at [facility] ENDOSCOPY    UPPER GASTROINTESTINAL ENDOSCOPY N/A 09/21/2023    ESOPHAGOGASTRODUODENOSCOPY performed by Glyn Ade, MD at [facility] ENDOSCOPY    UPPER GASTROINTESTINAL ENDOSCOPY N/A 09/21/2023    ESOPHAGOGASTRODUODENOSCOPY BIOPSY performed by  Glyn Ade, MD at [facility] ENDOSCOPY    [place] ABSCESS DRAINAGE PERITONEAL/RETROPERITONEAL PERC  02/11/2023    [place] ABSCESS DRAINAGE PERITONEAL 02/11/2023 [facility] [place]         CURRENT MEDICATIONS       Previous Medications    AMITRIPTYLINE (ELAVIL) 10 MG TABLET    Take 1 tablet by mouth nightly    AMLODIPINE (NORVASC) 10 MG TABLET    Take 1 tablet by mouth daily    ASPIRIN 81 MG CHEWABLE TABLET    Take 1 tablet by mouth daily    BUSPIRONE (BUSPAR) 15 MG TABLET    Take 15 mg by mouth 3 times daily    CLOPIDOGREL (PLAVIX) 75 MG TABLET    Take 1 tablet by mouth daily    DICYCLOMINE (BENTYL) 20 MG TABLET    Take 1 tablet by mouth every 6 hours as needed (abdominal cramping)    DULOXETINE (CYMBALTA) 60 MG EXTENDED RELEASE CAPSULE    Take 1 capsule by mouth daily    LINACLOTIDE (LINZESS) 290  MCG CAPS CAPSULE    Take 1 capsule by mouth every morning (before breakfast)    LISINOPRIL (PRINIVIL;ZESTRIL) 40 MG TABLET    Take 1 tablet by mouth daily    METFORMIN (GLUCOPHAGE-XR) 500 MG EXTENDED RELEASE TABLET    Take 2 tablets by mouth daily (with breakfast)    METOCLOPRAMIDE (REGLAN) 10 MG TABLET    Take 1 tablet by mouth in the morning, at noon, and at bedtime    METOPROLOL SUCCINATE (TOPROL XL) 50 MG EXTENDED RELEASE TABLET    Take 1 tablet by mouth daily    ONDANSETRON (ZOFRAN-ODT) 4 MG DISINTEGRATING TABLET    Take 1 tablet by mouth every 8 hours as needed for Nausea or Vomiting    PANTOPRAZOLE (PROTONIX) 40 MG TABLET    Take 1 tablet by mouth 2 times daily (before meals)    POLYETHYLENE GLYCOL (GLYCOLAX) 17 GM/SCOOP POWDER    Take 17 g by mouth 2 times daily    PROCHLORPERAZINE (COMPAZINE) 10 MG TABLET    Take 1 tablet by mouth every 6 hours as needed (nausea or vomiting)    SENNOSIDES-DOCUSATE SODIUM (SENOKOT-S) 8.6-50 MG TABLET    Take 1 tablet by mouth in the morning and at bedtime       ALLERGIES     Patient has no known allergies.    FAMILY HISTORY     No family history on file.       SOCIAL HISTORY       Social History     Socioeconomic History    Marital status: Married   Tobacco Use    Smoking status: Never    Smokeless tobacco: Never   Vaping Use    Vaping status: Never Used   Substance and Sexual Activity    Alcohol use: Never    Drug use: Never    Sexual activity: Defer     Social Determinants of Health     Food Insecurity: No Food Insecurity (11/01/2023)    Hunger Vital Sign     Worried About Running Out of Food in the Last Year: Never true     Ran Out of Food in the Last Year: Never true   Transportation Needs: Unmet Transportation Needs (11/01/2023)    PRAPARE - Therapist, art (Medical): Yes     Lack of Transportation (Non-Medical): No   Housing Stability: Low Risk  (11/01/2023)    Housing  Stability Vital Sign     Unable to Pay for Housing in the Last Year: No      Number of Times Moved in the Last Year: 0     Homeless in the Last Year: No           PHYSICAL EXAM    (up to 7 for level 4, 8 or more for level 5)     ED Triage Vitals [11/28/23 1526]   BP Systolic BP Percentile Diastolic BP Percentile Temp Temp Source Pulse Respirations SpO2   (!) 207/126 -- -- 98.1 F (36.7 C) Oral (!) 123 20 99 %      Height Weight - Scale         1.499 m (4\' 11" ) 52.2 kg (115 lb 1.3 oz)             Body mass index is 23.24 kg/m.    Physical Exam  Vitals and nursing note reviewed.   Constitutional:       General: She is in acute distress.      Appearance: She is ill-appearing.   HENT:      Mouth/Throat:      Mouth: Mucous membranes are moist.      Comments: Dry mucosa  Eyes:      Extraocular Movements: Extraocular movements intact.      Pupils: Pupils are equal, round, and reactive to light.   Cardiovascular:      Rate and Rhythm: Tachycardia present.   Pulmonary:      Effort: Pulmonary effort is normal.      Breath sounds: Normal breath sounds.   Abdominal:      General: There is no distension.      Comments: Diffuse abdominal pain with palpation, voluntary guarding but no rebound.  Hypoactive bowel sounds throughout   Skin:     General: Skin is warm and dry.   Neurological:      Mental Status: She is alert.         DIAGNOSTIC RESULTS     EKG: All EKG's are interpreted by the Emergency Department Physician who either signs or Co-signs this chart in the absence of a cardiologist.        RADIOLOGY:   Non-plain film images such as CT, Ultrasound and MRI are read by the radiologist. Plain radiographic images are visualized and preliminarily interpreted by the emergency physician with the below findings:        Interpretation per the Radiologist below, if available at the time of this note:    No orders to display        LABS:  Labs Reviewed   URINALYSIS WITH MICROSCOPIC - Abnormal; Notable for the following components:       Result Value    Appearance CLOUDY (*)     Protein, UA >300 (*)      Glucose, Ur >1000 (*)     Blood, Urine SMALL (*)     Leukocyte Esterase, Urine MODERATE (*)     Epithelial Cells, UA MANY (*)     BACTERIA, URINE 4+ (*)     All other components within normal limits   URINE DRUG SCREEN - Abnormal; Notable for the following components:    THC, TH-Cannabinol, Urine Positive (*)     All other components within normal limits   POCT GLUCOSE - Abnormal; Notable for the following components:    POC Glucose 237 (*)     All other components within normal limits   POC  PREGNANCY UR-QUAL       All other labs were within normal range or not returned as of this dictation.    EMERGENCY DEPARTMENT COURSE and DIFFERENTIAL DIAGNOSIS/MDM:   Vitals:    Vitals:    11/28/23 1526 11/28/23 1700   BP: (!) 207/126 (!) 166/107   Pulse: (!) 123 (!) 111   Resp: 20    Temp: 98.1 F (36.7 C)    TempSrc: Oral    SpO2: 99%    Weight: 52.2 kg (115 lb 1.3 oz)    Height: 1.499 m (4\' 11" )            Medical Decision Making  43 year old female presents emergency department chief complaint of mid abdominal pain that started this morning along with nausea and vomiting.  She has a history of gastroparesis secondary to diabetes as well as chronic pancreatitis.  Family member at bedside states that she does not have a history of alcohol or drug use.  Denies history of marijuana use.  Reviewed prior records and she has had multiple admissions for hypertensive urgency dehydration and significant gastroparesis.  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  She is also on Linzess and has had multiple evaluations by GI.  Patient is nonfebrile but appears extremely uncomfortable.  Will give IV fluids pain medicine check lab work.    Amount and/or Complexity of Data Reviewed  Labs: ordered.  ECG/medicine tests: ordered.    Risk  Prescription drug management.            REASSESSMENT     ED Course as of 11/28/23 1739   Mon Nov 28, 2023   1712 EKG: Sinus tachycardia, right  superior axis deviation, ventricular rate 109, PR 132, QRS 80, QTc 495, no acute ST changes   [KP]      ED Course User Index  [KP] Aura Dials, DO           CONSULTS:  None    PROCEDURES:  Unless otherwise noted below, none     Procedures  Difficulty obtaining IV access.  Reluctant to put in central or IJ as, at her age she will run out of vascular access.  Will give IM meds and reevaluate      5:39 PM  Appears more comfortable, no longer vomiting, sugar WNL, has history of HTN      Patient appears much more comfortable, no continued vomiting.  No guarding at this time.  Does not note to have a history of hypertension.  Advised her to avoid marijuana containing products.  Vies her to follow-up with her GI physician    FINAL IMPRESSION      1. Other chronic pain    2. Essential hypertension    3. 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  Efforts were made to edit the dictations but occasionally words are mis-transcribed.)    Remi Deter, DO (electronically signed)  Emergency Attending  Physician / Physician Assistant / Nurse Practitioner             Aura Dials, DO  11/28/23 1739

## 2023-11-28 NOTE — ED Triage Notes (Signed)
Pt arrives to the ER for complaints of mid abdominal pain that started this morning along with nausea and vomiting.     Pt has a h/x pancreatitis

## 2023-11-30 LAB — EKG 12-LEAD
Atrial Rate: 109 {beats}/min
P Axis: 63 degrees
P-R Interval: 132 ms
Q-T Interval: 366 ms
QRS Duration: 80 ms
QTc Calculation (Bazett): 492 ms
R Axis: 258 degrees
T Axis: 58 degrees
Ventricular Rate: 109 {beats}/min

## 2024-02-17 ENCOUNTER — Emergency Department: Admit: 2024-02-18 | Payer: MEDICAID | Primary: Adult Health

## 2024-02-17 ENCOUNTER — Inpatient Hospital Stay
Admission: EM | Admit: 2024-02-17 | Discharge: 2024-02-23 | Disposition: A | Discharge status: Home / Self Care | Payer: MEDICAID | Admitting: Student in an Organized Health Care Education/Training Program

## 2024-02-17 ENCOUNTER — Emergency Department: Payer: MEDICAID | Primary: Adult Health

## 2024-02-17 ENCOUNTER — Emergency Department: Admit: 2024-02-17 | Payer: MEDICAID | Primary: Adult Health

## 2024-02-17 DIAGNOSIS — K3184 Gastroparesis: Secondary | ICD-10-CM

## 2024-02-17 DIAGNOSIS — I160 Hypertensive urgency: Secondary | ICD-10-CM

## 2024-02-17 LAB — POCT BLOOD GAS & ELECTROLYTES
Anion Gap, POC: 8 — ABNORMAL LOW (ref 10–20)
Base Deficit (POC): 5.5 mmol/L
HCO3, Arterial: 18 mmol/L
PCO2, Venus, POC: 28.8 mmHg — ABNORMAL LOW (ref 41–51)
PH, VENOUS (POC): 7.41 (ref 7.32–7.42)
PO2, VENOUS (POC): 200 mmHg — ABNORMAL HIGH (ref 25–40)
POC Chloride: 113 MMOL/L — ABNORMAL HIGH (ref 100–111)
POC Creatinine: 2.7 mg/dL — ABNORMAL HIGH (ref 0.6–1.3)
POC Glucose: 181 mg/dL — ABNORMAL HIGH (ref 74–99)
POC Ionized Calcium: 1.1 mmol/L — ABNORMAL LOW (ref 1.15–1.33)
POC Lactic Acid: 0.73 mmol/L (ref 0.40–2.00)
POC O2 SAT: 100 % — ABNORMAL HIGH (ref 94–98)
POC Potassium: 5 MMOL/L (ref 3.5–5.5)
POC Sodium: 138 MMOL/L (ref 136–145)
POC TCO2: 17 MMOL/L — ABNORMAL LOW (ref 22–29)
eGFR, POC: 22 mL/min/{1.73_m2} — ABNORMAL LOW (ref >60)

## 2024-02-17 MED ORDER — SODIUM CHLORIDE 0.9 % IV BOLUS
0.9 | Freq: Once | INTRAVENOUS | Status: AC
Start: 2024-02-17 — End: 2024-02-17
  Administered 2024-02-18: 1000 mL via INTRAVENOUS

## 2024-02-17 MED ORDER — ONDANSETRON HCL 4 MG/2ML IJ SOLN
4 | Freq: Once | INTRAMUSCULAR | Status: AC
Start: 2024-02-17 — End: 2024-02-17
  Administered 2024-02-18: 4 mg via INTRAVENOUS

## 2024-02-17 MED ORDER — MORPHINE SULFATE (PF) 2 MG/ML IV SOLN
2 | INTRAVENOUS | Status: AC
Start: 2024-02-17 — End: 2024-02-17
  Administered 2024-02-18: 2 mg via INTRAVENOUS

## 2024-02-17 MED ORDER — ACETAMINOPHEN 500 MG PO TABS
500 | ORAL | Status: AC
Start: 2024-02-17 — End: 2024-02-17
  Administered 2024-02-17: 1000 mg via ORAL

## 2024-02-17 MED FILL — ACETAMINOPHEN EXTRA STRENGTH 500 MG PO TABS: 500 MG | ORAL | Qty: 2 | Fill #0

## 2024-02-17 MED FILL — ONDANSETRON HCL 4 MG/2ML IJ SOLN: 4 MG/2ML | INTRAMUSCULAR | Qty: 2 | Fill #0

## 2024-02-17 MED FILL — SODIUM CHLORIDE 0.9 % IV SOLN: 0.9 % | INTRAVENOUS | Qty: 1000 | Fill #0

## 2024-02-17 NOTE — ED Notes (Signed)
 2042 Patient complaining of pain at IV site, fluids stopped. IV infiltrated. This RN removed IV.    2050 John RN attempted ultrasound IV with no success.    2103 Notified RRT to come down and attempt ultrasound IV

## 2024-02-17 NOTE — ED Notes (Addendum)
 1955 Successful IV attempt John, RN 22G LAC. Labs and blood cultures x 2 drawn.    1930 This RN attempted to place IV x 2 with no success. Becca RN at bedside now with ultrasound    1945 Becca RN 2 unsuccessful ultrasound attempts. Abigail, PA, made aware of patient's elevated blood pressures. No new orders at this time.

## 2024-02-17 NOTE — ED Notes (Signed)
 SBAR and bedside shift report given to [place], Charity fundraiser.

## 2024-02-17 NOTE — Consults (Signed)
 Session ID: [id number]  Language: Spanish  Interpreter ID: #[id number]  Interpreter Name: Genevia Kern

## 2024-02-17 NOTE — ED Triage Notes (Signed)
 Pt arrives co cough sore throat and back pain since last sunday. Pt states she was recently seen and diagnosed with pnemonia on Tuesday  Pt states abd pain and vomiting since yesterday

## 2024-02-17 NOTE — ED Notes (Addendum)
 2232 US  IV attempts unsuccessful     Admitting provider made aware     Dynamic Access consulted for mid line placement     2243 Dynamic Access RN returned call, 15 min ETA    2320 Access established by Dynamic Access RN

## 2024-02-17 NOTE — Consults (Signed)
 Session ID: [id number]  Language: Spanish  Interpreter ID: #[id number]  Interpreter Name: Clare Critchley

## 2024-02-17 NOTE — ED Notes (Signed)
 Mary RRT at bedside to attempt IV placement. 2 unsuccessful IV ultrasound attempts.     2233 This RN notified Dr. Eloy Half of lack of IV access and many unsuccessful ultrasound attempts. Dr. Eloy Half notified this RN to call dynamic access for PICC line. Dr. Eloy Half aware of patient's elevated blood pressures. Dr. Eloy Half aware of patient's bladder scan of 2mL. No new orders at this time.    2235 Jess Charge RN called dynamic access.

## 2024-02-17 NOTE — H&P (Signed)
 Hospitalist Admission Note    NAME: Melinda [name]   DOB:  04/15/1981   MRN:  [id number]     Date/Time:  02/17/2024 11:03 PM    Patient PCP: Adelfa Homes, APRN - NP    Please note that this dictation was completed with Dragon, the computer voice recognition software.  Quite often unanticipated grammatical, syntax, homophones, and other interpretive errors are inadvertently transcribed by the computer software.  Please disregard these errors.  Please excuse any errors that have escaped final proofreading  ________________________________________________________________________    Given the patient's current clinical presentation, I have a high level of concern for decompensation if discharged from the emergency department.  Complex decision making was performed, which includes reviewing the patient's available past medical records, laboratory results, and x-ray films.       My assessment of this patient's clinical condition and my plan of care is as follows.    Assessment / Plan:    Intractable n/v  Suspect gastroparesis  --IV reglan  ACHS, clear liquid and advance as tolerated  --IVF, zofran  prn, compazine  prn  --check UDS  --CT abdomen with b/l pleural effusions  --check rapid covid    AKI on CKD 3  --Cr 2.9, baseline 1.6, suspect due to dehydration  --IVF, hold lisinopril , check UA.  Bladder scan in ER 2ml so no urinary retention    Headache  Blurred vision  HTN urgency with SBP 190-220  --IV labetalol  x 1.  BP gradually improved to 150-160s  --cont amlodipine , metoprolol     Hx SMA stenting  --cont aspirin , plavix     DM  --hold metformin , low dose SSI    Protein Malnutrition  --albumin  2.1  --give albumin  25gm to avoid further 3rd spacing and prevent pleural fluid from worsening while giving IVF        Medical Decision Making:   I have personally reviewed the radiographs, laboratory data in Epic and decisions and statements above are based partially on this personal interpretation.    Code Status: Full  Code  DVT Prophylaxis: Lovenox   GI Prophylaxis: PPI         Subjective:   CHIEF COMPLAINT: "n/v, abdominal pain, headache"    HISTORY OF PRESENT ILLNESS:   Spanish interpreter used to assist with evaluation  Melinda [name] is a 43 y.o.  female with PMHx hypertension, hyperlipidemia, diabetes type 2, CKD stage III baseline creatinine 1.6, history of multiple hospitalizations for intractable nausea and vomiting, gastroparesis with delayed gastric emptying scan January 2025, history of SMA stenosis status post stenting, presents with vomiting " all night and all day" with nonbloody emesis, diarrhea 10 times, abdominal pain generalized, headache with blurred vision, occasional shortness of breath, intermittent pressure in the chest when she has shortness of breath, diffuse back pain.    On presentation to the ER, temperature 99, heart rate 116, blood pressure 192/150 went up as high as 223/136, respiratory rate 16, 99% room air.  IV access was difficult to obtain and patient eventually has a midline placed by dynamic access.  After given IV Compazine  she has not had any further vomiting per ER nurse.  No diarrhea in the emergency room.    She is referred for admission for AKI with creatinine of 2.9, intractable nausea and vomiting.  She is unable to provide the names of her medications.          Available records were reviewed at the time of H&P.        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)     Gastroparesis     Gastroparesis     GERD (gastroesophageal reflux disease)     High cholesterol     Hypertension       Past Surgical History:   Procedure Laterality Date    CAPSULE ENDOSCOPY N/A 01/20/2023    ESOPHAGEAL CAPSULE ENDOSCOPY remove at 1624PM performed by Rocco Christians, MD at [facility] ENDOSCOPY    CHOLECYSTECTOMY, LAPAROSCOPIC N/A 02/03/2023    ROBOTIC LAPAROSCOPIC CHOLECYSTECTOMY with Indocyanine green  performed by Warren Haber, MD at  [facility] MAIN OR    COLONOSCOPY N/A 01/19/2023    COLONOSCOPY DIAGNOSTIC performed by Rocco Christians, MD at [facility] ENDOSCOPY    INVASIVE VASCULAR N/A 10/03/2023    Angiography visceral SMA performed by Maudie Sorrow, MD at [facility] CARDIAC CATH LAB    INVASIVE VASCULAR N/A 10/03/2023    Ultrasound guided vascular access performed by Maudie Sorrow, MD at [facility] CARDIAC CATH LAB    INVASIVE VASCULAR N/A 10/03/2023    Insert stent peripheral artery performed by Maudie Sorrow, MD at [facility] CARDIAC CATH LAB    IR NONTUNNELED VASCULAR CATHETER > 5 YEARS  10/10/2023    IR NONTUNNELED VASCULAR CATHETER > 5 YEARS 10/10/2023 [facility] CARDIAC CATH/EP/IR LAB    IR NONTUNNELED VASCULAR CATHETER > 5 YEARS  10/10/2023    IR NONTUNNELED VASCULAR CATHETER > 5 YEARS 10/10/2023 [facility] CARDIAC CATH/EP/IR LAB    OTHER SURGICAL HISTORY Left     Rentia attachment    TUBAL LIGATION Bilateral     UPPER GASTROINTESTINAL ENDOSCOPY N/A 01/17/2023    ESOPHAGOGASTRODUODENOSCOPY performed by Rocco Christians, MD at [facility] ENDOSCOPY    UPPER GASTROINTESTINAL ENDOSCOPY N/A 01/17/2023    ESOPHAGOGASTRODUODENOSCOPY BIOPSY performed by Rocco Christians, MD at [facility] ENDOSCOPY    UPPER GASTROINTESTINAL ENDOSCOPY N/A 01/18/2023    ESOPHAGOGASTRODUODENOSCOPY performed by Rocco Christians, MD at [facility] ENDOSCOPY    UPPER GASTROINTESTINAL ENDOSCOPY N/A 05/13/2023    ESOPHAGOGASTRODUODENOSCOPY performed by Bynum Cassis, MD at [facility] ENDOSCOPY    UPPER GASTROINTESTINAL ENDOSCOPY N/A 05/13/2023    ESOPHAGOGASTRODUODENOSCOPY BIOPSY performed by Bynum Cassis, MD at [facility] ENDOSCOPY    UPPER GASTROINTESTINAL ENDOSCOPY N/A 09/21/2023    ESOPHAGOGASTRODUODENOSCOPY performed by Rocco Christians, MD at [facility] ENDOSCOPY    UPPER GASTROINTESTINAL ENDOSCOPY N/A 09/21/2023    ESOPHAGOGASTRODUODENOSCOPY BIOPSY performed by Rocco Christians, MD at [facility] ENDOSCOPY    US  ABSCESS DRAINAGE PERITONEAL/RETROPERITONEAL [facility]  02/11/2023    US  ABSCESS DRAINAGE PERITONEAL 02/11/2023 [facility] US      Social History     Tobacco Use    Smoking  status: Never    Smokeless tobacco: Never   Substance Use Topics    Alcohol use: Never      No family history on file.     No Known Allergies     Prior to Admission medications    Medication Sig Start Date End Date Taking? Authorizing Provider   sennosides-docusate sodium  (SENOKOT-S) 8.6-50 MG tablet Take 1 tablet by mouth in the morning and at bedtime 11/06/23   Do, Khoi B, MD   ondansetron  (ZOFRAN -ODT) 4 MG disintegrating tablet Take 1 tablet by mouth every 8 hours as needed for Nausea or Vomiting 11/06/23   Do, Shiela Dooms, MD   metoclopramide  (REGLAN ) 10 MG tablet Take 1 tablet by mouth in the morning, at noon, and at bedtime 11/06/23   Do, Williams  B, MD   prochlorperazine  (COMPAZINE ) 10 MG tablet Take 1 tablet by mouth every 6 hours as needed (nausea or vomiting) 10/25/23   Luther Saltness, MD   metFORMIN  (GLUCOPHAGE -XR) 500 MG extended release tablet Take 2 tablets by mouth daily (with breakfast) 10/25/23   Aktig, Ziya, MD   busPIRone  (BUSPAR ) 15 MG tablet Take 15 mg by mouth 3 times daily 10/14/23   Tefera, Mesfin A, MD   DULoxetine  (CYMBALTA ) 60 MG extended release capsule Take 1 capsule by mouth daily 10/15/23   Tefera, Mesfin A, MD   clopidogrel  (PLAVIX ) 75 MG tablet Take 1 tablet by mouth daily 10/15/23   Tefera, Mesfin A, MD   aspirin  81 MG chewable tablet Take 1 tablet by mouth daily 10/15/23   Tefera, Mesfin A, MD   linaclotide  (LINZESS ) 290 MCG CAPS capsule Take 1 capsule by mouth every morning (before breakfast) 09/23/23   Muncy, Terra Ferns, MD   amitriptyline  (ELAVIL ) 10 MG tablet Take 1 tablet by mouth nightly 09/14/23 12/13/23  Lyell, Elmira Haddock, MD   polyethylene glycol (GLYCOLAX ) 17 GM/SCOOP powder Take 17 g by mouth 2 times daily 09/14/23 03/12/24  Lyell, Elmira Haddock, MD   dicyclomine  (BENTYL ) 20 MG tablet Take 1 tablet by mouth every 6 hours as needed (abdominal cramping) 09/14/23   Lyell, Elmira Haddock, MD   pantoprazole  (PROTONIX ) 40 MG tablet Take 1 tablet by mouth 2 times daily (before meals) 05/29/23   Aktig, Ziya, MD    lisinopril  (PRINIVIL ;ZESTRIL ) 40 MG tablet Take 1 tablet by mouth daily 03/23/23   Do, Khoi B, MD   metoprolol  succinate (TOPROL  XL) 50 MG extended release tablet Take 1 tablet by mouth daily 03/23/23   Do, Khoi B, MD   amLODIPine  (NORVASC ) 10 MG tablet Take 1 tablet by mouth daily 03/23/23   Do, Shiela Dooms, MD       REVIEW OF SYSTEMS:  See HPI for details   POSITIVE= Bold.  Negative = normal text  General:  fever, chills, sweats, generalized weakness, weight loss/gain, loss of appetite  Eyes:  blurred vision, eye pain, loss of vision, diplopia  Ear Nose and Throat:  rhinorrhea, pharyngitis  Respiratory:   cough, sputum production, SOB, wheezing, DOE, pleuritic pain  Cardiology:  chest pain, palpitations, orthopnea, PND, edema, syncope   Gastrointestinal:  abdominal pain, N/V, dysphagia, diarrhea, constipation, bleeding  Genitourinary:  frequency, urgency, dysuria, hematuria, incontinence  Muskuloskeletal :  arthralgia--body ache, myalgia  Hematology:  easy bruising, bleeding, lymphadenopathy  Dermatological:  rash, ulceration, pruritis  Endocrine:  hot flashes or polydipsia  Neurological:  headache, dizziness, confusion, focal weakness, paresthesia, memory loss, gait disturbance  Psychological: anxiety, depression, agitation      Objective:   VITALS:    Vitals:    02/17/24 2230   BP: (!) 188/113   Pulse: 100   Resp: 14   Temp: 98.2 F (36.8 C)   SpO2: 95%     Patient Vitals for the past 24 hrs:   BP Temp Temp src Pulse Resp SpO2 Height Weight   02/17/24 2230 (!) 188/113 98.2 F (36.8 C) Oral 100 14 95 % -- --   02/17/24 2145 (!) 199/117 -- -- 97 15 97 % -- --   02/17/24 2130 (!) 228/130 -- -- (!) 112 16 96 % -- --   02/17/24 2030 (!) 203/113 -- -- (!) 106 19 95 % -- --   02/17/24 2015 (!) 214/110 -- -- (!) 108 15 97 % -- --   02/17/24  2000 (!) 212/134 -- -- (!) 114 18 99 % -- --   02/17/24 1915 (!) 198/135 -- -- (!) 110 17 99 % -- --   02/17/24 1900 (!) 223/138 -- -- (!) 116 19 99 % -- --   02/17/24 1859 (!) 223/138  98.4 F (36.9 C) Oral -- -- -- -- --   02/17/24 1847 (!) 192/150 99 F (37.2 C) Oral (!) 116 16 99 % 1.499 m (4\' 11" ) 58.1 kg (128 lb)       Temp (24hrs), Avg:98.5 F (36.9 C), Min:98.2 F (36.8 C), Max:99 F (37.2 C)           Wt Readings from Last 12 Encounters:   02/17/24 58.1 kg (128 lb)   11/28/23 52.2 kg (115 lb 1.3 oz)   10/31/23 52.6 kg (115 lb 15.4 oz)   10/21/23 55.2 kg (121 lb 11.1 oz)   10/12/23 60.4 kg (133 lb 3.2 oz)   10/03/23 56.7 kg (125 lb)   09/22/23 56.7 kg (125 lb)   09/06/23 56.7 kg (125 lb)   08/31/23 56.7 kg (125 lb)   07/19/23 59 kg (130 lb)   06/17/23 59 kg (130 lb)   06/11/23 59 kg (130 lb 1.1 oz)     Body mass index is 25.85 kg/m.    PHYSICAL EXAM:    Gen: Lethargic, difficulty staying awake, Well-developed, well-nourished, in no acute distress  HEENT:  Pink conjunctivae, PERRL, hearing intact to voice, dry mucous membranes  Neck: Supple, without masses, thyroid non-tender  Resp: No accessory muscle use, clear breath sounds without wheezes rales or rhonchi  Card: No murmurs, normal S1, S2 without thrills, bruits or peripheral edema  Abd:  Soft, mild diffuse tenderness, no guarding or rebound, barely any bowel sounds are present, no palpable organomegaly and no detectable hernias  Lymph:  No cervical or inguinal adenopathy  Musc: No cyanosis or clubbing  Skin: No rashes or ulcers, skin turgor is good  Neuro:  Cranial nerves are grossly intact, no focal motor weakness 5/5, finger to nose intact, follows commands appropriately  Psych:  oriented to person, place and time, alert          _______________________________________________________________________  Care Plan discussed with:    Comments   Patient y Discussed with patient in room. POC outlined and Questions answered    Family      RN x    Care Manager                    Consultant:  x ED MD   _______________________________________________________________________  Recommended Disposition:   Home with Family y   HH/PT/OT/RN     SNF/LTC    SAHR    ________________________________________________________________________  TOTAL TIME:  65 Minutes        Comments   >50% of visit spent in counseling and coordination of care  Chart reviewed  Discussion with patient and/or family and questions answered     LAB DATA REVIEWED:    Recent Results (from the past 12 hours)   POCT Blood Gas & Electrolytes    Collection Time: 02/17/24  7:37 PM   Result Value Ref Range    PH, VENOUS (POC) 7.41 7.32 - 7.42      PCO2, Venus, POC 28.8 (L) 41 - 51 MMHG    PO2, VENOUS (POC) 200 (H) 25 - 40 mmHg    HCO3, Arterial 18 mmol/L    Base Deficit (POC) 5.5 mmol/L    POC  O2 SAT 100 (H) 94 - 98 %    POC Sodium 138 136 - 145 MMOL/L    POC Potassium 5.0 3.5 - 5.5 MMOL/L    POC Chloride 113 (H) 100 - 111 MMOL/L    POC TCO2 17 (L) 22 - 29 MMOL/L    Anion Gap, POC 8 (L) 10 - 20      POC Glucose 181 (H) 74 - 99 MG/DL    POC Creatinine 2.7 (H) 0.6 - 1.3 MG/DL    eGFR, POC 22 (L) >16 ml/min/1.69m2    POC Ionized Calcium  1.10 (L) 1.15 - 1.33 mmol/L    POC Lactic Acid 0.73 0.40 - 2.00 mmol/L    Source VENOUS BLOOD      Critical Value Read Back ABIGAIL PA    EKG 12 Lead (Abn HR)    Collection Time: 02/17/24  7:56 PM   Result Value Ref Range    Ventricular Rate 111 BPM    Atrial Rate 111 BPM    P-R Interval 136 ms    QRS Duration 86 ms    Q-T Interval 366 ms    QTc Calculation (Bazett) 497 ms    P Axis 28 degrees    R Axis -75 degrees    T Axis 37 degrees    Diagnosis       Sinus tachycardia  Left axis deviation  Minimal voltage criteria for LVH, may be normal variant ( Cornell product )  Abnormal ECG  When compared with ECG of 28-Nov-2023 17:02,  No significant change was found     CBC with Auto Differential    Collection Time: 02/17/24  8:03 PM   Result Value Ref Range    WBC 7.9 3.6 - 11.0 K/uL    RBC 3.37 (L) 3.80 - 5.20 M/uL    Hemoglobin 9.1 (L) 11.5 - 16.0 g/dL    Hematocrit 10.9 (L) 35.0 - 47.0 %    MCV 83.7 80.0 - 99.0 FL    MCH 27.0 26.0 - 34.0 PG    MCHC 32.3 30.0 - 36.5  g/dL    RDW 60.4 (H) 54.0 - 14.5 %    Platelets 516 (H) 150 - 400 K/uL    MPV 8.8 (L) 8.9 - 12.9 FL    Nucleated RBCs 0.0 0 PER 100 WBC    nRBC 0.00 0.00 - 0.01 K/uL    Neutrophils % 68.1 32.0 - 75.0 %    Lymphocytes % 19.3 12.0 - 49.0 %    Monocytes % 8.6 5.0 - 13.0 %    Eosinophils % 2.8 0.0 - 7.0 %    Basophils % 0.8 0.0 - 1.0 %    Immature Granulocytes % 0.4 0.0 - 0.5 %    Neutrophils Absolute 5.38 1.80 - 8.00 K/UL    Lymphocytes Absolute 1.52 0.80 - 3.50 K/UL    Monocytes Absolute 0.68 0.00 - 1.00 K/UL    Eosinophils Absolute 0.22 0.00 - 0.40 K/UL    Basophils Absolute 0.06 0.00 - 0.10 K/UL    Immature Granulocytes Absolute 0.03 0.00 - 0.04 K/UL    Differential Type AUTOMATED     Comprehensive Metabolic Panel    Collection Time: 02/17/24  8:03 PM   Result Value Ref Range    Sodium 139 136 - 145 mmol/L    Potassium 4.7 3.5 - 5.1 mmol/L    Chloride 113 (H) 97 - 108 mmol/L    CO2 20 (L) 21 - 32 mmol/L    Anion Gap  6 2 - 12 mmol/L    Glucose 166 (H) 65 - 100 mg/dL    BUN 39 (H) 6 - 20 MG/DL    Creatinine 1.09 (H) 0.55 - 1.02 MG/DL    BUN/Creatinine Ratio 13 12 - 20      Est, Glom Filt Rate 20 (L) >60 ml/min/1.14m2    Calcium  8.5 8.5 - 10.1 MG/DL    Total Bilirubin 0.2 0.2 - 1.0 MG/DL    ALT 33 12 - 78 U/L    AST 29 15 - 37 U/L    Alk Phosphatase 148 (H) 45 - 117 U/L    Total Protein 6.3 (L) 6.4 - 8.2 g/dL    Albumin  2.1 (L) 3.5 - 5.0 g/dL    Globulin 4.2 (H) 2.0 - 4.0 g/dL    Albumin /Globulin Ratio 0.5 (L) 1.1 - 2.2     Lipase    Collection Time: 02/17/24  8:03 PM   Result Value Ref Range    Lipase 27 13 - 75 U/L   Magnesium     Collection Time: 02/17/24  8:03 PM   Result Value Ref Range    Magnesium  1.8 1.6 - 2.4 mg/dL   HCG Qualitative, Serum    Collection Time: 02/17/24  8:03 PM   Result Value Ref Range    Preg, Serum Negative NEG           CT Result (most recent):  CT ABDOMEN PELVIS WO CONTRAST 02/17/2024    Narrative  CLINICAL HISTORY: epigastric pain  INDICATION: epigastric pain  COMPARISON:  10/21/2023  CONTRAST:  None.    TECHNIQUE:  Thin axial images were obtained through the abdomen and pelvis. Coronal and  sagittal reformats were generated. Oral contrast was not administered. CT dose  reduction was achieved through use of a standardized protocol tailored for this  examination and automatic exposure control for dose modulation.    The absence of intravenous contrast material significantly reduces the  sensitivity for evaluation of visceral organs and vasculature including presence  of small mass lesions, hemodynamically significant stenosis, dissections,  mucosal abnormalities etc.    FINDINGS:  LOWER THORAX: There is cardiomegaly. Moderate right and small left-sided pleural  effusion. Small pericardial effusion.  LIVER/GALLBLADDER: No mass.  CBD is not dilated.  SPLEEN/PANCREAS:  No mass lesion or splenomegaly  ADRENALS/KIDNEYS: Unremarkable. No obstructive renal calculus or hydronephrosis.  STOMACH: Unremarkable.  SMALL BOWEL/COLON: Fecal stasis.  PERITONEUM: No ascites or pneumoperitoneum.  RETROPERITONEUM: Mesenteric atherosclerotic change. There is a mesenteric stent  at the SMA.  APPENDIX: Unremarkable.  BLADDER/REPRODUCTIVE ORGANS: No acute process.  BONES:  No acute fracture or dislocation.  ADDITIONAL COMMENTS: N/A    Impression  Moderate right and small left-sided pleural effusion with small pericardial  effusion.  Cardiomegaly.    Incidental and/or nonemergent findings are as described in detail above.      Electronically signed by Lella Putt Result (most recent):  XR CHEST PORTABLE 02/17/2024    Narrative  EXAM: XR CHEST PORTABLE  ACC#: [id number]    INDICATION: SOB, []     COMPARISON: Chest radiograph October 10, 2023    FINDINGS: AP portable chest radiograph. The lungs are adequately expanded. The  heart size is within normal limits. There is no focal airspace consolidation.  The vascular clarity is within normal limits. There is no evidence of pleural  effusion or  pneumothorax. No displaced fracture is seen.    Impression  No acute cardiopulmonary findings.  Electronically signed by Vina Greaves      ________________________________________________________________________  Signed: Jerrlyn Morel, MD        Procedures: see electronic medical records for all procedures/Xrays/labs and details which were not copied into this note but were reviewed prior to creation of Plan.

## 2024-02-17 NOTE — ED Provider Notes (Signed)
 [facility] EMERGENCY DEPARTMENT  EMERGENCY DEPARTMENT ENCOUNTER      Pt Name: Melinda [name]  MRN: [id number]  Birthdate Sep 07, 1981  Date of evaluation: 02/17/2024  Provider: Bailey Bolus Cutchins, PA-C    CHIEF COMPLAINT       Chief Complaint   Patient presents with    Abdominal Pain    Vomiting    Pharyngitis           Cough    Back Pain         HISTORY OF PRESENT ILLNESS   (Location/Symptom, Timing/Onset, Context/Setting, Quality, Duration, Modifying Factors, Severity)  Note limiting factors.   43 yo F with history of IDDM, CKD, gastroparesis presenting to the ED for cough and back pain. She was seen by her PCP on 5/6 for similar symptoms and was diagnosed with multifocal pneumonia and treated with a course of cefuroxime. Her symptoms have not improved. She is now also having epigastric abdominal pain and vomiting since yesterday. She is endorsing SOB. She has a nonproductive cough as well as a sore throat. She has been able to eating and drinking at home, though is having trouble with this. She denies fevers at home.  Denies numbness, tingling, weakness, saddle anesthesias, urinary or bowel incontinence, trauma to the back or recent instrumentation. On review of EMR, she has had recurrent epigastric pain, nausea, and vomiting requiring several admissions in the past and extensive workup.          The history is provided by the patient. The history is limited by a language barrier. A language interpreter was used.     Review of External Medical Records:     Nursing Notes were reviewed.    REVIEW OF SYSTEMS    (2-9 systems for level 4, 10 or more for level 5)     Review of Systems    Except as noted above the remainder of the review of systems was reviewed and negative.       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)     Gastroparesis     Gastroparesis     GERD (gastroesophageal reflux  disease)     High cholesterol     Hypertension          SURGICAL HISTORY       Past Surgical History:   Procedure Laterality Date    CAPSULE ENDOSCOPY N/A 01/20/2023    ESOPHAGEAL CAPSULE ENDOSCOPY remove at 1624PM performed by Rocco Christians, MD at [facility] ENDOSCOPY    CHOLECYSTECTOMY, LAPAROSCOPIC N/A 02/03/2023    ROBOTIC LAPAROSCOPIC CHOLECYSTECTOMY with Indocyanine green  performed by Warren Haber, MD at [facility] MAIN OR    COLONOSCOPY N/A 01/19/2023    COLONOSCOPY DIAGNOSTIC performed by Rocco Christians, MD at [facility] ENDOSCOPY    INVASIVE VASCULAR N/A 10/03/2023    Angiography visceral SMA performed by Maudie Sorrow, MD at [facility] CARDIAC CATH LAB    INVASIVE VASCULAR N/A 10/03/2023    Ultrasound guided vascular access performed by Maudie Sorrow, MD at [facility] CARDIAC CATH LAB    INVASIVE VASCULAR N/A 10/03/2023    Insert stent peripheral artery performed by Maudie Sorrow, MD at [facility] CARDIAC CATH LAB    IR NONTUNNELED VASCULAR CATHETER > 5 YEARS  10/10/2023    IR NONTUNNELED VASCULAR CATHETER > 5 YEARS 10/10/2023 [facility] CARDIAC CATH/EP/IR  LAB    IR NONTUNNELED VASCULAR CATHETER > 5 YEARS  10/10/2023    IR NONTUNNELED VASCULAR CATHETER > 5 YEARS 10/10/2023 [facility] CARDIAC CATH/EP/IR LAB    OTHER SURGICAL HISTORY Left     Rentia attachment    TUBAL LIGATION Bilateral     UPPER GASTROINTESTINAL ENDOSCOPY N/A 01/17/2023    ESOPHAGOGASTRODUODENOSCOPY performed by Rocco Christians, MD at [facility] ENDOSCOPY    UPPER GASTROINTESTINAL ENDOSCOPY N/A 01/17/2023    ESOPHAGOGASTRODUODENOSCOPY BIOPSY performed by Rocco Christians, MD at [facility] ENDOSCOPY    UPPER GASTROINTESTINAL ENDOSCOPY N/A 01/18/2023    ESOPHAGOGASTRODUODENOSCOPY performed by Rocco Christians, MD at [facility] ENDOSCOPY    UPPER GASTROINTESTINAL ENDOSCOPY N/A 05/13/2023    ESOPHAGOGASTRODUODENOSCOPY performed by Bynum Cassis, MD at [facility] ENDOSCOPY    UPPER GASTROINTESTINAL ENDOSCOPY N/A 05/13/2023    ESOPHAGOGASTRODUODENOSCOPY BIOPSY performed by Bynum Cassis, MD at [facility] ENDOSCOPY    UPPER  GASTROINTESTINAL ENDOSCOPY N/A 09/21/2023    ESOPHAGOGASTRODUODENOSCOPY performed by Rocco Christians, MD at [facility] ENDOSCOPY    UPPER GASTROINTESTINAL ENDOSCOPY N/A 09/21/2023    ESOPHAGOGASTRODUODENOSCOPY BIOPSY performed by Rocco Christians, MD at [facility] ENDOSCOPY    US  ABSCESS DRAINAGE PERITONEAL/RETROPERITONEAL [facility]  02/11/2023    US  ABSCESS DRAINAGE PERITONEAL 02/11/2023 [facility] RAD US          CURRENT MEDICATIONS       Previous Medications    AMITRIPTYLINE  (ELAVIL ) 10 MG TABLET    Take 1 tablet by mouth nightly    AMLODIPINE  (NORVASC ) 10 MG TABLET    Take 1 tablet by mouth daily    ASPIRIN  81 MG CHEWABLE TABLET    Take 1 tablet by mouth daily    BUSPIRONE  (BUSPAR ) 15 MG TABLET    Take 15 mg by mouth 3 times daily    CLOPIDOGREL  (PLAVIX ) 75 MG TABLET    Take 1 tablet by mouth daily    DICYCLOMINE  (BENTYL ) 20 MG TABLET    Take 1 tablet by mouth every 6 hours as needed (abdominal cramping)    DULOXETINE  (CYMBALTA ) 60 MG EXTENDED RELEASE CAPSULE    Take 1 capsule by mouth daily    LINACLOTIDE  (LINZESS ) 290 MCG CAPS CAPSULE    Take 1 capsule by mouth every morning (before breakfast)    LISINOPRIL  (PRINIVIL ;ZESTRIL ) 40 MG TABLET    Take 1 tablet by mouth daily    METFORMIN  (GLUCOPHAGE -XR) 500 MG EXTENDED RELEASE TABLET    Take 2 tablets by mouth daily (with breakfast)    METOCLOPRAMIDE  (REGLAN ) 10 MG TABLET    Take 1 tablet by mouth in the morning, at noon, and at bedtime    METOPROLOL  SUCCINATE (TOPROL  XL) 50 MG EXTENDED RELEASE TABLET    Take 1 tablet by mouth daily    ONDANSETRON  (ZOFRAN -ODT) 4 MG DISINTEGRATING TABLET    Take 1 tablet by mouth every 8 hours as needed for Nausea or Vomiting    PANTOPRAZOLE  (PROTONIX ) 40 MG TABLET    Take 1 tablet by mouth 2 times daily (before meals)    POLYETHYLENE GLYCOL (GLYCOLAX ) 17 GM/SCOOP POWDER    Take 17 g by mouth 2 times daily    PROCHLORPERAZINE  (COMPAZINE ) 10 MG TABLET    Take 1 tablet by mouth every 6 hours as needed (nausea or vomiting)    SENNOSIDES-DOCUSATE SODIUM  (SENOKOT-S) 8.6-50  MG TABLET    Take 1 tablet by mouth in the morning and at bedtime       ALLERGIES     Patient has no known allergies.  FAMILY HISTORY     No family history on file.       SOCIAL HISTORY       Social History     Socioeconomic History    Marital status: Married   Tobacco Use    Smoking status: Never    Smokeless tobacco: Never   Vaping Use    Vaping status: Never Used   Substance and Sexual Activity    Alcohol use: Never    Drug use: Never    Sexual activity: Defer     Social Drivers of Health     Financial Resource Strain: Low Risk  (02/01/2024)    Received from [facility]    Overall Financial Resource Strain (CARDIA)     Difficulty of Paying Living Expenses: Not hard at all   Food Insecurity: No Food Insecurity (02/01/2024)    Received from [facility]    Hunger Vital Sign     Worried About Running Out of Food in the Last Year: Never true     Ran Out of Food in the Last Year: Never true   Transportation Needs: No Transportation Needs (02/01/2024)    Received from [facility]    PRAPARE - Transportation     In the past 12 months, has lack of transportation kept you from medical appointments or from getting medications?: No     Lack of Transportation (Non-Medical): No   Housing Stability: Low Risk  (02/01/2024)    Received from [facility]    Housing Stability Vital Sign     Unable to Pay for Housing in the Last Year: No     Number of Times Moved in the Last Year: 0     Homeless in the Last Year: No           PHYSICAL EXAM    (up to 7 for level 4, 8 or more for level 5)     ED Triage Vitals   BP Systolic BP Percentile Diastolic BP Percentile Temp Temp src Pulse Resp SpO2   -- -- -- -- -- -- -- --      Height Weight         -- --             Body mass index is 25.85 kg/m.    Physical Exam  Constitutional:       Appearance: Normal appearance. She is ill-appearing.   HENT:      Head: Normocephalic and atraumatic.      Mouth/Throat:      Mouth: Mucous  membranes are moist.   Eyes:      Extraocular Movements: Extraocular movements intact.      Conjunctiva/sclera: Conjunctivae normal.   Cardiovascular:      Rate and Rhythm: Normal rate.   Pulmonary:      Effort: Pulmonary effort is normal.   Abdominal:      General: Abdomen is flat.   Skin:     General: Skin is warm and dry.   Neurological:      Mental Status: She is alert. Mental status is at baseline.   Psychiatric:         Mood and Affect: Mood normal.         Behavior: Behavior normal.         DIAGNOSTIC RESULTS     EKG: All EKG's are interpreted by the Emergency Department Physician who either signs or Co-signs this chart in the  absence of a cardiologist.        RADIOLOGY:   Non-plain film images such as CT, Ultrasound and MRI are read by the radiologist. Plain radiographic images are visualized and preliminarily interpreted by the emergency physician with the below findings:        Interpretation per the Radiologist below, if available at the time of this note:    CT ABDOMEN PELVIS WO CONTRAST Additional Contrast? None   Final Result   Moderate right and small left-sided pleural effusion with small pericardial   effusion.   Cardiomegaly.      Incidental and/or nonemergent findings are as described in detail above.         Electronically signed by Reford Canterbury HABIB      XR CHEST PORTABLE   Final Result   No acute cardiopulmonary findings.               Electronically signed by Vina Greaves           LABS:  Labs Reviewed   CBC WITH AUTO DIFFERENTIAL - Abnormal; Notable for the following components:       Result Value    RBC 3.37 (*)     Hemoglobin 9.1 (*)     Hematocrit 28.2 (*)     RDW 16.0 (*)     Platelets 516 (*)     MPV 8.8 (*)     All other components within normal limits   COMPREHENSIVE METABOLIC PANEL - Abnormal; Notable for the following components:    Chloride 113 (*)     CO2 20 (*)     Glucose 166 (*)     BUN 39 (*)     Creatinine 2.96 (*)     Est, Glom Filt Rate 20 (*)     Alk Phosphatase 148 (*)     Total  Protein 6.3 (*)     Albumin  2.1 (*)     Globulin 4.2 (*)     Albumin /Globulin Ratio 0.5 (*)     All other components within normal limits   POCT BLOOD GAS & ELECTROLYTES - Abnormal; Notable for the following components:    PCO2, Venus, POC 28.8 (*)     PO2, VENOUS (POC) 200 (*)     POC O2 SAT 100 (*)     POC Chloride 113 (*)     POC TCO2 17 (*)     Anion Gap, POC 8 (*)     POC Glucose 181 (*)     POC Creatinine 2.7 (*)     eGFR, POC 22 (*)     POC Ionized Calcium  1.10 (*)     All other components within normal limits   CULTURE, BLOOD 1   CULTURE, BLOOD 2   LIPASE   MAGNESIUM    HCG, SERUM, QUALITATIVE   LACTIC ACID   LACTIC ACID   URINALYSIS WITH REFLEX TO CULTURE   BLOOD GAS, VENOUS   URINALYSIS WITH MICROSCOPIC   POC PREGNANCY UR-QUAL       All other labs were within normal range or not returned as of this dictation.    EMERGENCY DEPARTMENT COURSE and DIFFERENTIAL DIAGNOSIS/MDM:   Vitals:    Vitals:    02/17/24 2030 02/17/24 2130 02/17/24 2145 02/17/24 2230   BP: (!) 203/113 (!) 228/130 (!) 199/117 (!) 188/113   Pulse: (!) 106 (!) 112 97 100   Resp: 19 16 15 14    Temp:    98.2 F (36.8 C)   TempSrc:  Oral   SpO2: 95% 96% 97% 95%   Weight:       Height:               Medical Decision Making  43 year old female with history of IDDM, CKD, gastroparesis presenting to the ED with cough, back pain, epigastric pain nausea and vomiting.  She was seen by her PCP for the cough and back pain on 5/6 and diagnosed with a multifocal pneumonia.  She was treated with a course of cefuroxime, but continues to have symptoms.  Starting yesterday she also developed epigastric abdominal discomfort and vomiting.  Review of EMR shows several admissions in the past for similar symptoms which appear to be in part related to her gastroparesis.  She denies any trauma to the back, numbness, tingling, saddle anesthesias, urinary or bowel incontinence, or recent instrumentation.  She states she has not been having any fevers at home.    In  the ED, she is acutely ill appearing.  Mildly diaphoretic on exam and vomiting in triage.  She is markedly hypertensive to 223/138 and mildly tachycardic to 116.  Afebrile and appears to be breathing comfortably.  Saturating well on room air.  CXR without evidence of pneumonia or other acute cardiopulmonary abnormality.  POC glucose 181.  Venous pH WNL.  Creatinine elevated to 2.7 with most recent baseline for comparison of 1.68 on 11/06/2023.  Potassium upper limits of normal at 5.0.  CT of the abdomen pelvis pending.  She was treated in the ED with IV fluids, antiemetics, and analgesics.  Hospitalist team consulted for admission in setting of AKI on CKD and hypertensive urgency.    Amount and/or Complexity of Data Reviewed  Labs: ordered. Decision-making details documented in ED Course.  Radiology: ordered. Decision-making details documented in ED Course.  ECG/medicine tests: ordered.    Risk  OTC drugs.  Prescription drug management.  Decision regarding hospitalization.          REASSESSMENT     ED Course as of 02/18/24 1028   Fri Feb 17, 2024   2327 EKG independently reviewed by me:    EKG obtained at 1956. Noted with sinus rhythm at a rate of 111.  Left axis and normal intervals. Narrow complex QRS.  Nonspecific ST changes noted. No ectopy or ischemia noted. [RS]      ED Course User Index  [RS] Woodard Haymaker, MD           CONSULTS:  IP CONSULT TO HOSPITALIST      Perfect Serve Consult for Admission  10:46 PM    ED Room Number: ER13/13  Patient Name and age:  Azania Lillis Toto-Ambros 43 y.o.  female  Working Diagnosis:   1. Hypertensive urgency    2. Acute kidney injury superimposed on CKD    3. Abdominal pain, epigastric    4. Nausea and vomiting, unspecified vomiting type        COVID-19 Suspicion: No  Sepsis present:  No  Reassessment needed: Yes  Code Status:  Full Code  Readmission: Yes  Isolation Requirements: no  Recommended Level of Care: telemetry  Department: [facility] - [id number]    Other:  43 year old female with history of IDDM, gastroparesis, and CKD presenting to the emergency department with cough, shortness of breath, epigastric pain, nausea, and vomiting.  She was seen by her PCP on 5/6 and diagnosed with multifocal pneumonia treated with a course of cefuroxime.  She states her symptoms have not improved and has  now developed epigastric abdominal pain and vomiting since yesterday.  In the ED she is markedly hypertensive and tachycardic.  Afebrile and breathing comfortably on room air.  Actively vomiting in the ED.  She has been admitted for similar in the past.  CXR without evidence of pneumonia.  No leukocytosis on CBC.  She does have acute on chronic renal dysfunction with a creatinine of 2.9 and a most recent creatinine of 1.6 for comparison.  Glucose 166.  No evidence of DKA.  CT abdomen and pelvis pending, but patient is unable to produce a urine sample to obtain urine pregnancy.  Requesting admission in setting of AKI on CKD, hypertensive urgency.  She had US  guided IV placed, but access was lost.  Working on obtaining access.  She has received about 200 cc IV fluids    PROCEDURES:  Unless otherwise noted below, none     Procedures      FINAL IMPRESSION      1. Hypertensive urgency    2. Acute kidney injury superimposed on CKD    3. Abdominal pain, epigastric    4. Nausea and vomiting, unspecified vomiting type          DISPOSITION/PLAN   DISPOSITION Admitted 02/17/2024 10:05:24 PM      PATIENT REFERRED TO:  No follow-up provider specified.    DISCHARGE MEDICATIONS:  New Prescriptions    No medications on file         (Please note that portions of this note were completed with a voice recognition program.  Efforts were made to edit the dictations but occasionally words are mis-transcribed.)    Abigail M Cutchins, PA-C (electronically signed)  Emergency Attending Physician / Physician Assistant / Nurse Practitioner            Cutchins, Bailey Bolus, PA-C  02/17/24 2247    I was personally  available for consultation in the emergency department.  I have reviewed the chart and agree with the documentation recorded by the [facility], including the assessment, treatment plan, and disposition.  Jeffrey Mini, MD         Joneen Nelson, MD  02/18/24 1028

## 2024-02-18 LAB — CBC WITH AUTO DIFFERENTIAL
Basophils %: 0.8 % (ref 0.0–1.0)
Basophils Absolute: 0.06 10*3/uL (ref 0.00–0.10)
Eosinophils %: 2.8 % (ref 0.0–7.0)
Eosinophils Absolute: 0.22 10*3/uL (ref 0.00–0.40)
Hematocrit: 28.2 % — ABNORMAL LOW (ref 35.0–47.0)
Hemoglobin: 9.1 g/dL — ABNORMAL LOW (ref 11.5–16.0)
Immature Granulocytes %: 0.4 % (ref 0.0–0.5)
Immature Granulocytes Absolute: 0.03 10*3/uL (ref 0.00–0.04)
Lymphocytes %: 19.3 % (ref 12.0–49.0)
Lymphocytes Absolute: 1.52 10*3/uL (ref 0.80–3.50)
MCH: 27 pg (ref 26.0–34.0)
MCHC: 32.3 g/dL (ref 30.0–36.5)
MCV: 83.7 FL (ref 80.0–99.0)
MPV: 8.8 FL — ABNORMAL LOW (ref 8.9–12.9)
Monocytes %: 8.6 % (ref 5.0–13.0)
Monocytes Absolute: 0.68 10*3/uL (ref 0.00–1.00)
Neutrophils %: 68.1 % (ref 32.0–75.0)
Neutrophils Absolute: 5.38 10*3/uL (ref 1.80–8.00)
Nucleated RBCs: 0 /100{WBCs}
Platelets: 516 10*3/uL — ABNORMAL HIGH (ref 150–400)
RBC: 3.37 M/uL — ABNORMAL LOW (ref 3.80–5.20)
RDW: 16 % — ABNORMAL HIGH (ref 11.5–14.5)
WBC: 7.9 10*3/uL (ref 3.6–11.0)
nRBC: 0 10*3/uL (ref 0.00–0.01)

## 2024-02-18 LAB — POCT GLUCOSE
POC Glucose: 203 mg/dL — ABNORMAL HIGH (ref 65–117)
POC Glucose: 161 mg/dL — ABNORMAL HIGH (ref 65–117)
POC Glucose: 142 mg/dL — ABNORMAL HIGH (ref 65–117)

## 2024-02-18 LAB — BASIC METABOLIC PANEL
Anion Gap: 5 mmol/L (ref 2–12)
BUN/Creatinine Ratio: 14 (ref 12–20)
BUN: 41 mg/dL — ABNORMAL HIGH (ref 6–20)
CO2: 20 mmol/L — ABNORMAL LOW (ref 21–32)
Calcium: 8.2 mg/dL — ABNORMAL LOW (ref 8.5–10.1)
Chloride: 116 mmol/L — ABNORMAL HIGH (ref 97–108)
Creatinine: 2.85 mg/dL — ABNORMAL HIGH (ref 0.55–1.02)
Est, Glom Filt Rate: 21 mL/min/{1.73_m2} — ABNORMAL LOW (ref >60)
Glucose: 147 mg/dL — ABNORMAL HIGH (ref 65–100)
Potassium: 5 mmol/L (ref 3.5–5.1)
Sodium: 141 mmol/L (ref 136–145)

## 2024-02-18 LAB — URINALYSIS WITH REFLEX TO CULTURE
BACTERIA, URINE: NEGATIVE /HPF
Bilirubin, Urine: NEGATIVE
Glucose, Ur: >1000 mg/dL — AB
Ketones, Urine: NEGATIVE mg/dL
Leukocyte Esterase, Urine: NEGATIVE
Nitrite, Urine: NEGATIVE
Protein, UA: >300 mg/dL — AB
Specific Gravity, UA: 1.017 (ref 1.003–1.030)
Urobilinogen, Urine: 0.2 EU/dL (ref 0.2–1.0)
pH, Urine: 6 (ref 5.0–8.0)

## 2024-02-18 LAB — COMPREHENSIVE METABOLIC PANEL
ALT: 33 U/L (ref 12–78)
AST: 29 U/L (ref 15–37)
Albumin/Globulin Ratio: 0.5 — ABNORMAL LOW (ref 1.1–2.2)
Albumin: 2.1 g/dL — ABNORMAL LOW (ref 3.5–5.0)
Alk Phosphatase: 148 U/L — ABNORMAL HIGH (ref 45–117)
Anion Gap: 6 mmol/L (ref 2–12)
BUN/Creatinine Ratio: 13 (ref 12–20)
BUN: 39 mg/dL — ABNORMAL HIGH (ref 6–20)
CO2: 20 mmol/L — ABNORMAL LOW (ref 21–32)
Calcium: 8.5 mg/dL (ref 8.5–10.1)
Chloride: 113 mmol/L — ABNORMAL HIGH (ref 97–108)
Creatinine: 2.96 mg/dL — ABNORMAL HIGH (ref 0.55–1.02)
Est, Glom Filt Rate: 20 mL/min/{1.73_m2} — ABNORMAL LOW (ref >60)
Globulin: 4.2 g/dL — ABNORMAL HIGH (ref 2.0–4.0)
Glucose: 166 mg/dL — ABNORMAL HIGH (ref 65–100)
Potassium: 4.7 mmol/L (ref 3.5–5.1)
Sodium: 139 mmol/L (ref 136–145)
Total Bilirubin: 0.2 mg/dL (ref 0.2–1.0)
Total Protein: 6.3 g/dL — ABNORMAL LOW (ref 6.4–8.2)

## 2024-02-18 LAB — BRAIN NATRIURETIC PEPTIDE: NT Pro-BNP: >35000 pg/mL — ABNORMAL HIGH (ref <125)

## 2024-02-18 LAB — LACTIC ACID: Lactic Acid, Plasma: 0.6 MMOL/L (ref 0.4–2.0)

## 2024-02-18 LAB — LIPASE: Lipase: 27 U/L (ref 13–75)

## 2024-02-18 LAB — COVID-19, RAPID: SARS-CoV-2, PCR: NOT DETECTED

## 2024-02-18 LAB — MAGNESIUM: Magnesium: 1.8 mg/dL (ref 1.6–2.4)

## 2024-02-18 LAB — HCG, SERUM, QUALITATIVE: Preg, Serum: NEGATIVE

## 2024-02-18 MED ORDER — POLYETHYLENE GLYCOL 3350 17 G PO PACK
17 g | Freq: Every day | ORAL | Status: DC | PRN
Start: 2024-02-18 — End: 2024-02-23

## 2024-02-18 MED ORDER — METOCLOPRAMIDE HCL 5 MG/ML IJ SOLN
5 | Freq: Four times a day (QID) | INTRAMUSCULAR | Status: DC
Start: 2024-02-18 — End: 2024-02-18

## 2024-02-18 MED ORDER — SODIUM CHLORIDE 0.9 % IV SOLN
0.9 | INTRAVENOUS | Status: DC
Start: 2024-02-18 — End: 2024-02-18
  Administered 2024-02-18: 11:00:00 via INTRAVENOUS

## 2024-02-18 MED ORDER — ENOXAPARIN SODIUM 30 MG/0.3ML IJ SOSY
30 MG/0.3ML | Freq: Every day | INTRAMUSCULAR | Status: DC
Start: 2024-02-18 — End: 2024-02-23
  Administered 2024-02-18 – 2024-02-23 (×6): 30 mg via SUBCUTANEOUS

## 2024-02-18 MED ORDER — SENNA-DOCUSATE SODIUM 8.6-50 MG PO TABS
8.6-50 MG | Freq: Two times a day (BID) | ORAL | Status: DC
Start: 2024-02-18 — End: 2024-02-23
  Administered 2024-02-18 – 2024-02-23 (×11): 1 via ORAL

## 2024-02-18 MED ORDER — METOCLOPRAMIDE HCL 5 MG/ML IJ SOLN
5 | INTRAMUSCULAR | Status: AC
Start: 2024-02-18 — End: 2024-02-17
  Administered 2024-02-18: 01:00:00 5 mg via INTRAVENOUS

## 2024-02-18 MED ORDER — ACETAMINOPHEN 650 MG RE SUPP
650 MG | Freq: Four times a day (QID) | RECTAL | Status: DC | PRN
Start: 2024-02-18 — End: 2024-02-23

## 2024-02-18 MED ORDER — METOCLOPRAMIDE HCL 5 MG/ML IJ SOLN
5 MG/ML | Freq: Four times a day (QID) | INTRAMUSCULAR | Status: DC
Start: 2024-02-18 — End: 2024-02-23
  Administered 2024-02-18 – 2024-02-23 (×19): 5 mg via INTRAVENOUS

## 2024-02-18 MED ORDER — SODIUM CHLORIDE 0.9 % IV SOLN
0.9 % | INTRAVENOUS | Status: DC | PRN
Start: 2024-02-18 — End: 2024-02-23

## 2024-02-18 MED ORDER — AMLODIPINE BESYLATE 5 MG PO TABS
5 MG | Freq: Every day | ORAL | Status: DC
Start: 2024-02-18 — End: 2024-02-23
  Administered 2024-02-18 – 2024-02-23 (×6): 10 mg via ORAL

## 2024-02-18 MED ORDER — METOCLOPRAMIDE HCL 5 MG/ML IJ SOLN
5 | Freq: Four times a day (QID) | INTRAMUSCULAR | Status: DC
Start: 2024-02-18 — End: 2024-02-18
  Administered 2024-02-18 (×2): 10 mg via INTRAVENOUS

## 2024-02-18 MED ORDER — ONDANSETRON 4 MG PO TBDP
4 MG | Freq: Three times a day (TID) | ORAL | Status: DC | PRN
Start: 2024-02-18 — End: 2024-02-23

## 2024-02-18 MED ORDER — LINACLOTIDE 290 MCG PO CAPS
290 | Freq: Every day | ORAL | Status: DC
Start: 2024-02-18 — End: 2024-02-18

## 2024-02-18 MED ORDER — METOPROLOL SUCCINATE ER 50 MG PO TB24
50 MG | Freq: Every day | ORAL | Status: DC
Start: 2024-02-18 — End: 2024-02-23
  Administered 2024-02-18 – 2024-02-23 (×6): 50 mg via ORAL

## 2024-02-18 MED ORDER — ALBUMIN HUMAN 25 % IV SOLN
25 | Freq: Once | INTRAVENOUS | Status: AC
Start: 2024-02-18 — End: 2024-02-18
  Administered 2024-02-18: 10:00:00 25 g via INTRAVENOUS

## 2024-02-18 MED ORDER — DIPHENHYDRAMINE HCL 50 MG/ML IJ SOLN
50 | INTRAMUSCULAR | Status: AC
Start: 2024-02-18 — End: 2024-02-17
  Administered 2024-02-18: 01:00:00 12.5 mg via INTRAVENOUS

## 2024-02-18 MED ORDER — HYDROMORPHONE HCL PF 1 MG/ML IJ SOLN
1 MG/ML | INTRAMUSCULAR | Status: DC | PRN
Start: 2024-02-18 — End: 2024-02-22
  Administered 2024-02-18 – 2024-02-22 (×13): 1 mg via INTRAVENOUS

## 2024-02-18 MED ORDER — LABETALOL HCL 5 MG/ML IV SOLN
5 | Freq: Four times a day (QID) | INTRAVENOUS | Status: DC | PRN
Start: 2024-02-18 — End: 2024-02-17

## 2024-02-18 MED ORDER — NORMAL SALINE FLUSH 0.9 % IV SOLN
0.9 % | Freq: Two times a day (BID) | INTRAVENOUS | Status: DC
Start: 2024-02-18 — End: 2024-02-23
  Administered 2024-02-18 – 2024-02-23 (×8): 10 mL via INTRAVENOUS

## 2024-02-18 MED ORDER — PANTOPRAZOLE SODIUM 40 MG PO TBEC
40 MG | Freq: Two times a day (BID) | ORAL | Status: DC
Start: 2024-02-18 — End: 2024-02-23
  Administered 2024-02-18 – 2024-02-23 (×11): 40 mg via ORAL

## 2024-02-18 MED ORDER — LABETALOL HCL 5 MG/ML IV SOLN
5 | Freq: Four times a day (QID) | INTRAVENOUS | Status: DC | PRN
Start: 2024-02-18 — End: 2024-02-18
  Administered 2024-02-18: 09:00:00 10 mg via INTRAVENOUS

## 2024-02-18 MED ORDER — ASPIRIN 81 MG PO CHEW
81 MG | Freq: Every day | ORAL | Status: DC
Start: 2024-02-18 — End: 2024-02-23
  Administered 2024-02-18 – 2024-02-23 (×6): 81 mg via ORAL

## 2024-02-18 MED ORDER — OXYCODONE HCL 5 MG PO TABS
5 | ORAL | Status: AC
Start: 2024-02-18 — End: 2024-02-17
  Administered 2024-02-18: 01:00:00 5 mg via ORAL

## 2024-02-18 MED ORDER — HYDROMORPHONE 0.5MG/0.5ML IJ SOLN
1 | Status: DC | PRN
Start: 2024-02-18 — End: 2024-02-18
  Administered 2024-02-18 (×2): 0.5 mg via INTRAVENOUS

## 2024-02-18 MED ORDER — LABETALOL HCL 5 MG/ML IV SOLN
5 MG/ML | INTRAVENOUS | Status: DC | PRN
Start: 2024-02-18 — End: 2024-02-23
  Administered 2024-02-19: 09:00:00 20 mg via INTRAVENOUS

## 2024-02-18 MED ORDER — CLOPIDOGREL BISULFATE 75 MG PO TABS
75 MG | Freq: Every day | ORAL | Status: DC
Start: 2024-02-18 — End: 2024-02-23
  Administered 2024-02-18 – 2024-02-23 (×6): 75 mg via ORAL

## 2024-02-18 MED ORDER — LACTATED RINGERS IV SOLN
INTRAVENOUS | Status: AC
Start: 2024-02-18 — End: 2024-02-20
  Administered 2024-02-18: 14:00:00 via INTRAVENOUS

## 2024-02-18 MED ORDER — ACETAMINOPHEN 325 MG PO TABS
325 MG | Freq: Four times a day (QID) | ORAL | Status: DC | PRN
Start: 2024-02-18 — End: 2024-02-23
  Administered 2024-02-23 (×2): 650 mg via ORAL

## 2024-02-18 MED ORDER — NORMAL SALINE FLUSH 0.9 % IV SOLN
0.9 % | INTRAVENOUS | Status: DC | PRN
Start: 2024-02-18 — End: 2024-02-23

## 2024-02-18 MED ORDER — DICYCLOMINE HCL 20 MG PO TABS
20 MG | Freq: Four times a day (QID) | ORAL | Status: DC | PRN
Start: 2024-02-18 — End: 2024-02-23
  Administered 2024-02-19 – 2024-02-23 (×4): 20 mg via ORAL

## 2024-02-18 MED ORDER — PROCHLORPERAZINE EDISYLATE 10 MG/2ML IJ SOLN
10 MG/2ML | Freq: Four times a day (QID) | INTRAMUSCULAR | Status: DC | PRN
Start: 2024-02-18 — End: 2024-02-23
  Administered 2024-02-18 – 2024-02-19 (×2): 10 mg via INTRAVENOUS

## 2024-02-18 MED ORDER — DULOXETINE HCL 30 MG PO CPEP
30 MG | Freq: Every day | ORAL | Status: DC
Start: 2024-02-18 — End: 2024-02-23
  Administered 2024-02-18 – 2024-02-23 (×6): 60 mg via ORAL

## 2024-02-18 MED ORDER — LABETALOL HCL 5 MG/ML IV SOLN
5 | Freq: Once | INTRAVENOUS | Status: AC
Start: 2024-02-18 — End: 2024-02-17
  Administered 2024-02-18: 04:00:00 10 mg via INTRAVENOUS

## 2024-02-18 MED ORDER — SODIUM BICARBONATE 650 MG PO TABS
650 MG | Freq: Two times a day (BID) | ORAL | Status: DC
Start: 2024-02-18 — End: 2024-02-23
  Administered 2024-02-18 – 2024-02-23 (×11): 650 mg via ORAL

## 2024-02-18 MED ORDER — ONDANSETRON HCL 4 MG/2ML IJ SOLN
4 MG/2ML | Freq: Four times a day (QID) | INTRAMUSCULAR | Status: DC | PRN
Start: 2024-02-18 — End: 2024-02-23
  Administered 2024-02-19 – 2024-02-23 (×3): 4 mg via INTRAVENOUS

## 2024-02-18 MED ORDER — INSULIN LISPRO 100 UNIT/ML IJ SOLN
100 UNIT/ML | Freq: Four times a day (QID) | INTRAMUSCULAR | Status: DC
Start: 2024-02-18 — End: 2024-02-23
  Administered 2024-02-18 – 2024-02-23 (×9): 1 [IU] via SUBCUTANEOUS

## 2024-02-18 MED FILL — BD POSIFLUSH 0.9 % IV SOLN: 0.9 % | INTRAVENOUS | Qty: 40 | Fill #0

## 2024-02-18 MED FILL — METOCLOPRAMIDE HCL 5 MG/ML IJ SOLN: 5 MG/ML | INTRAMUSCULAR | Qty: 2 | Fill #0

## 2024-02-18 MED FILL — LABETALOL HCL 5 MG/ML IV SOLN: 5 MG/ML | INTRAVENOUS | Qty: 4 | Fill #0

## 2024-02-18 MED FILL — ALBUMIN HUMAN 25 % IV SOLN: 25 % | INTRAVENOUS | Qty: 100 | Fill #0

## 2024-02-18 MED FILL — ASPIRIN LOW DOSE 81 MG PO CHEW: 81 MG | ORAL | Qty: 1 | Fill #0

## 2024-02-18 MED FILL — PROCHLORPERAZINE EDISYLATE 10 MG/2ML IJ SOLN: 10 MG/2ML | INTRAMUSCULAR | Qty: 2 | Fill #0

## 2024-02-18 MED FILL — PANTOPRAZOLE SODIUM 40 MG PO TBEC: 40 MG | ORAL | Qty: 1 | Fill #0

## 2024-02-18 MED FILL — LACTATED RINGERS IV SOLN: INTRAVENOUS | Qty: 1000 | Fill #0

## 2024-02-18 MED FILL — AMLODIPINE BESYLATE 5 MG PO TABS: 5 MG | ORAL | Qty: 2 | Fill #0

## 2024-02-18 MED FILL — SODIUM CHLORIDE 0.9 % IV SOLN: 0.9 % | INTRAVENOUS | Qty: 1000 | Fill #0

## 2024-02-18 MED FILL — ENOXAPARIN SODIUM 30 MG/0.3ML IJ SOSY: 30 MG/0.3ML | INTRAMUSCULAR | Qty: 0.3 | Fill #0

## 2024-02-18 MED FILL — HYDROMORPHONE HCL 1 MG/ML IJ SOLN: 1 MG/ML | INTRAMUSCULAR | Qty: 0.5 | Fill #0

## 2024-02-18 MED FILL — METOPROLOL SUCCINATE ER 50 MG PO TB24: 50 MG | ORAL | Qty: 1 | Fill #0

## 2024-02-18 MED FILL — DULOXETINE HCL 30 MG PO CPEP: 30 MG | ORAL | Qty: 2 | Fill #0

## 2024-02-18 MED FILL — MORPHINE SULFATE 2 MG/ML IJ SOLN: 2 mg/mL | INTRAMUSCULAR | Qty: 1 | Fill #0

## 2024-02-18 MED FILL — SODIUM BICARBONATE 650 MG PO TABS: 650 MG | ORAL | Qty: 1 | Fill #0

## 2024-02-18 MED FILL — OXYCODONE HCL 5 MG PO TABS: 5 MG | ORAL | Qty: 1 | Fill #0

## 2024-02-18 MED FILL — SENNOSIDES-DOCUSATE SODIUM 8.6-50 MG PO TABS: 8.6-50 MG | ORAL | Qty: 1 | Fill #0

## 2024-02-18 MED FILL — DIPHENHYDRAMINE HCL 50 MG/ML IJ SOLN: 50 MG/ML | INTRAMUSCULAR | Qty: 1 | Fill #0

## 2024-02-18 MED FILL — HYDROMORPHONE HCL 1 MG/ML IJ SOLN: 1 MG/ML | INTRAMUSCULAR | Qty: 1 | Fill #0

## 2024-02-18 MED FILL — INSULIN LISPRO 100 UNIT/ML IJ SOLN: 100 UNIT/ML | INTRAMUSCULAR | Qty: 1 | Fill #0

## 2024-02-18 MED FILL — CLOPIDOGREL BISULFATE 75 MG PO TABS: 75 MG | ORAL | Qty: 1 | Fill #0

## 2024-02-18 NOTE — Consults (Signed)
 [facility]          [phone] [place], [place]  [address]                              CONSULTATION      PATIENT NAME: Melinda [name], Melinda [name]       DOB: Dec 11, 1980  MED REC NO: [id number]                       ROOM: [id number]  ACCOUNT NO: [id number]                       ADMIT DATE: 02/17/2024  PROVIDER: Sandrea Cruel, MD    DATE OF SERVICE:  02/18/2024    ATTENDING PHYSICIAN:  KHOI B DO    REASON FOR CONSULT:  Gastroparesis.    HISTORY OF PRESENT ILLNESS:  This is a 43 year old female with frequent admissions to [facility], who presents with nausea, vomiting, and abdominal pain.  She has had a large workup in the past including upper endoscopy, colonoscopy, CTs, MRCP, M2A, gastric-emptying scans, etc.  She is status post cholecystectomy and she had a gastric emptying scan notable for delayed emptying.  She has been notable for positive THC in the past.  She apparently has not been on any motility agents per her report through an interpreter.  Presently, she is on IV Reglan .  She is tolerating some liquids, but is still having some mild nausea, but pain is presently less, as is vomiting.    PAST MEDICAL HISTORY:  As above.    SOCIAL HISTORY:  Positive for marijuana use, though she denies this presently.    FAMILY HISTORY:  Negative for GI cancers.    SURGICAL HISTORY:  Notable for cholecystectomy.    REVIEW OF SYSTEMS:  11-point review of systems is negative except for the nausea, vomiting, and abdominal pain.    PHYSICAL EXAMINATION:  VITAL SIGNS:  Stable.  She is afebrile.   HEENT:  Anicteric.  Conjunctivae pink.    NECK:  Supple.  LUNGS:  Clear.  HEART:  Regular.    ABDOMEN:  Bowel sounds are present.  Soft.  Minimal tenderness to palpation in the upper abdomen.  No masses.  No organomegaly.  No rebound.  EXTREMITIES:  Without edema.  NEUROLOGIC:  Alert and oriented x3.  SKIN:  No rashes.  No purpura.    LABORATORY DATA:  Her chemistry panel is notable for chloride of  116, bicarb of 20, BUN is elevated at 41, creatinine 2.85, glucose is 147.  Her white count yesterday was 7.9, hemoglobin 9.1, platelet count was 516.    IMPRESSION:  Nausea, vomiting, abdominal pain with a history of delayed emptying and marijuana use.    PLAN:  Continue hydration noting the BUN and creatinine are elevated.  We will slowly advance diet as tolerated.  We would follow up with CBC noting her platelets are elevated, likely an inflammatory marker sign that could just be from the severe nausea and vomiting.  Would continue the IV Reglan .  Would limit narcotics since these can slow stomach emptying.        Sandrea Cruel, MD      HME/AQS  D:  02/18/2024 12:02:59  T:  02/18/2024 12:28:18  JOB #:  [id number]

## 2024-02-18 NOTE — Consults (Addendum)
 43 yo F presents w upper abd pain, N/V.  Numerous admissions in past for same.  Large GI w/u including egd. Colonoscopy, M2A, MRCP, HIDA and GES.  GES notable for delayed emptying  Has had +THC in pastbut denied recent marijuana use.  BS+, milly tender.  Slowly advance diet tomorrow as tolerated, cont Reglan , LIMIT narcotics  Dictated [phone]    Of note BUN/Creat have only come down slightly.  Nephro to see.  This could certainly be contributing to N/V

## 2024-02-18 NOTE — Progress Notes (Signed)
 [facility]  [address] [place], [place] [address]  [phone]        Hospitalist Progress Note      NAME: Melinda [name]   DOB:  [birth year]  MRM:  [id number]    Date/Time of service: 02/18/2024  10:02 AM       Subjective:     Chief Complaint:  Patient was personally seen and examined by me during this time period.  Chart reviewed.  Nausea, vomiting slightly better       Objective:       Vitals:       Last 24hrs VS reviewed since prior progress note. Most recent are:    Vitals:    02/18/24 0806   BP: (!) 164/102   Pulse: 90   Resp: 17   Temp: 97.2 F (36.2 C)   SpO2: 92%     SpO2 Readings from Last 6 Encounters:   02/18/24 92%   11/28/23 100%   11/06/23 92%   10/25/23 96%   10/14/23 98%   09/22/23 98%        No intake or output data in the 24 hours ending 02/18/24 1002     Exam:     Physical Exam:    Gen:  fraill, ill-appearing, mild distress  HEENT:  Pink conjunctivae, PERRL, hearing intact to voice, moist mucous membranes  Neck:  Supple, without masses, thyroid non-tender  Resp:  No accessory muscle use, clear breath sounds without wheezes rales or rhonchi  Card:  No murmurs, normal S1, S2 without thrills, bruits or peripheral edema  Abd:  Soft, mild diffused pain, non-distended, normoactive bowel sounds are present  Musc:  No cyanosis or clubbing  Skin:  No rashes  Neuro:  Cranial nerves 3-12 are grossly intact, follows commands appropriately  Psych:  fair insight, oriented to person, place and time, alert.  Spanish speaking    Medications Reviewed: (see below)    Lab Data Reviewed: (see below)    ______________________________________________________________________    Medications:     Current Facility-Administered Medications   Medication Dose Route Frequency    sodium chloride  flush 0.9 % injection 5-40 mL  5-40 mL IntraVENous 2 times per day    sodium chloride  flush 0.9 % injection 5-40 mL  5-40 mL IntraVENous PRN    0.9 % sodium chloride  infusion   IntraVENous PRN     enoxaparin  Sodium (LOVENOX ) injection 30 mg  30 mg SubCUTAneous Daily    ondansetron  (ZOFRAN -ODT) disintegrating tablet 4 mg  4 mg Oral Q8H PRN    Or    ondansetron  (ZOFRAN ) injection 4 mg  4 mg IntraVENous Q6H PRN    polyethylene glycol (GLYCOLAX ) packet 17 g  17 g Oral Daily PRN    acetaminophen  (TYLENOL ) tablet 650 mg  650 mg Oral Q6H PRN    Or    acetaminophen  (TYLENOL ) suppository 650 mg  650 mg Rectal Q6H PRN    amLODIPine  (NORVASC ) tablet 10 mg  10 mg Oral Daily    aspirin  chewable tablet 81 mg  81 mg Oral Daily    clopidogrel  (PLAVIX ) tablet 75 mg  75 mg Oral Daily    dicyclomine  (BENTYL ) tablet 20 mg  20 mg Oral Q6H PRN    DULoxetine  (CYMBALTA ) extended release capsule 60 mg  60 mg Oral Daily    metoprolol  succinate (TOPROL  XL) extended release tablet 50 mg  50 mg Oral Daily    pantoprazole  (PROTONIX ) tablet 40 mg  40 mg Oral BID AC    metoclopramide  (REGLAN ) injection 10 mg  10 mg IntraVENous 4x Daily AC & HS    insulin  lispro (HUMALOG ,ADMELOG ) injection vial 0-4 Units  0-4 Units SubCUTAneous 4x Daily AC & HS    lactated ringers  infusion   IntraVENous Continuous    HYDROmorphone  HCl PF (DILAUDID ) injection 1 mg  1 mg IntraVENous Q4H PRN    sennosides-docusate sodium  (SENOKOT-S) 8.6-50 MG tablet 1 tablet  1 tablet Oral BID    labetalol  (NORMODYNE ;TRANDATE ) injection 20 mg  20 mg IntraVENous Q4H PRN    sodium bicarbonate tablet 650 mg  650 mg Oral BID    prochlorperazine  (COMPAZINE ) injection 10 mg  10 mg IntraVENous Q6H PRN          Lab Review:     Recent Labs     02/17/24  2003   WBC 7.9   HGB 9.1*   HCT 28.2*   PLT 516*     Recent Labs     02/17/24  2003 02/18/24  0412   NA 139 141   K 4.7 5.0   CL 113* 116*   CO2 20* 20*   BUN 39* 41*   MG 1.8  --    ALT 33  --      No results found for: "GLUCPOC"       Assessment / Plan:     43 yo hx of HTN, DM, CKD 3, gastroparesis, cholecystectomy, distal SMA stenosis s/p vascular stenting, THC abuse, presented w/ recurrent, intractable abd pain, N/V, HTN urgency,  AKI    1) Recurrent/intractable abd pain/N/V: has severe gastroparesis.  Will scheduled IV reglan .  Cont IVF, IV Zofran  prn, IV compazine  prn.  Consider Azithromycin.  Consult GI    2) AKI/CKD 3: likely pre-renal.  Cont IVF (might switch to IV albumin  if volume overload).  Monitor BMP.  Consult Renal    3) Malignant HTN urgency: now improving.  Likely from N/V.  Cont norvasc , metoprolol .  Use IV labetalol  prn    4) PAD: hx of SMA stent.  Cont ASA, plavix     5) DM type 2: check A1C.  Start SSI    6) Mod pro-cal malnutrition: encourage PO as tolerated    7) Anemia: likely due to dilution, chronic disease.  Will check iron  studies    **Prior records, notes, labs, radiology, and medications reviewed in [facility]**    Total time spent with patient care: 45 Minutes **I personally saw and examined the patient during this time period**                 Care Plan discussed with: Patient, nursing     Discussed:  Care Plan    Prophylaxis:  Lovenox     Disposition:  HH PT, OT, RN           ___________________________________________________    Attending Physician: Tillman Folks, MD

## 2024-02-18 NOTE — Progress Notes (Signed)
 0920: BP is 164/102 and PRN labetolol was given at 0654. Order is Q4. MD made aware and RN instructed to monitor BP.

## 2024-02-18 NOTE — Consults (Signed)
 Session ID: [id number]  Language: Spanish  Interpreter ID: #[id number]  Interpreter Name: Adah Acron

## 2024-02-18 NOTE — Plan of Care (Signed)
 Problem: Safety - Adult  Goal: Free from fall injury  Outcome: Progressing     Problem: Chronic Conditions and Co-morbidities  Goal: Patient's chronic conditions and co-morbidity symptoms are monitored and maintained or improved  Outcome: Progressing  Flowsheets (Taken 02/18/2024 0443)  Care Plan - Patient's Chronic Conditions and Co-Morbidity Symptoms are Monitored and Maintained or Improved: Monitor and assess patient's chronic conditions and comorbid symptoms for stability, deterioration, or improvement

## 2024-02-18 NOTE — Consults (Signed)
 Session: [id number]  Interpreter Name and Number: Selmer Dally #[id number]  Language: Spanish.

## 2024-02-18 NOTE — Consults (Signed)
 NEPHROLOGY CONSULT NOTE           Patient: Melinda [name] MRN: [id number]  PCP: Adelfa Homes, APRN - NP   DOB:     07-28-1981  Age:   43 y.o.  Sex:  female      Referring physician: Dariel Edelson, MD    Date of Service :02/18/2024      Reason for Consult:  Acute Kidney Injury    Assessment     AKI vs progressive CKD   - Cr was 1.7 in January but lately running around 2.7- 2.8 mg/dl   Diabetic nephropathy with nephrotic proteinuria  5 to 11 g  Recurrent AKI and multiple admission   Recent admission in April 2025 for hyperkalemia 6.5 and AKI  Hypertesive urgency   Acute on chronic gastroparesis  SMA stent        Plan  It appears that she had prior GN workup that was unremarkable.  I will add SPEP and light chains for completion.    If she is able to tolerate p.o. I would discontinue IV fluids.  For hypertension continue metoprolol , amlodipine .  Add doxazosin 2 mg twice daily as needed.  She is at high risk for progression to ESRD.  She needs close follow-up with nephrology outpatient  Back on Monday . Pls call if any q's             HPI:    Melinda [name] is a 43 y.o. Hispanic / Latino female with history of diabetic nephropathy, recurrent acute kidney injury, gastroparesis, SMA stent who presented to the hospital on 02/17/2024 with recurrent vomiting and is admitted with a diagnosis of acute flare of gastroparesis.  On arrival her blood pressure was up to 223/138 mmHg.  Labs showed creatinine 2.9 mg/dL.  We are consulted for acute kidney injury.  Today her labs show creatinine 2.8, K5.0, CO2 20.  BP have improved to 150/90.  CTAP showed  moderate right effusion and small left effusion. No hydronephrosis   Chart review shows that she was recently admitted at [facility] 4/22-4/26/25 for hyperkalemia 6.5 and AKI r 2.7 to 3.3.  Her follow up cr post discharge  on 02/14/2024 was 3.2 mg/dL.    In January 2025  her creatinine was 1.7 mg/dl    Currently feels better.   Receiving IVF      ROS: The patient denies any SOB,orthopnea. No cough or sputum.All other systems negative.        All out patient meds reviewed.  Prior to Admission Medications   Prescriptions Last Dose Informant Patient Reported? Taking?   DULoxetine  (CYMBALTA ) 60 MG extended release capsule   No No   Sig: Take 1 capsule by mouth daily   amLODIPine  (NORVASC ) 10 MG tablet   No No   Sig: Take 1 tablet by mouth daily   amitriptyline  (ELAVIL ) 10 MG tablet   No No   Sig: Take 1 tablet by mouth nightly   aspirin  81 MG chewable tablet   No No   Sig: Take 1 tablet by mouth daily   busPIRone  (BUSPAR ) 15 MG tablet   No No   Sig: Take 15 mg by mouth 3 times daily   clopidogrel  (PLAVIX ) 75 MG tablet   No No   Sig: Take 1 tablet by mouth daily   dicyclomine  (BENTYL ) 20 MG tablet   No No   Sig: Take 1 tablet by mouth every 6 hours as needed (abdominal cramping)  linaclotide  (LINZESS ) 290 MCG CAPS capsule   No No   Sig: Take 1 capsule by mouth every morning (before breakfast)   lisinopril  (PRINIVIL ;ZESTRIL ) 40 MG tablet   No No   Sig: Take 1 tablet by mouth daily   metFORMIN  (GLUCOPHAGE -XR) 500 MG extended release tablet   No No   Sig: Take 2 tablets by mouth daily (with breakfast)   metoclopramide  (REGLAN ) 10 MG tablet   No No   Sig: Take 1 tablet by mouth in the morning, at noon, and at bedtime   metoprolol  succinate (TOPROL  XL) 50 MG extended release tablet   No No   Sig: Take 1 tablet by mouth daily   ondansetron  (ZOFRAN -ODT) 4 MG disintegrating tablet   No No   Sig: Take 1 tablet by mouth every 8 hours as needed for Nausea or Vomiting   pantoprazole  (PROTONIX ) 40 MG tablet   No No   Sig: Take 1 tablet by mouth 2 times daily (before meals)   polyethylene glycol (GLYCOLAX ) 17 GM/SCOOP powder   No No   Sig: Take 17 g by mouth 2 times daily   prochlorperazine  (COMPAZINE ) 10 MG tablet   No No   Sig: Take 1 tablet by mouth every 6 hours as needed (nausea or vomiting)    sennosides-docusate sodium  (SENOKOT-S) 8.6-50 MG tablet   No No   Sig: Take 1 tablet by mouth in the morning and at bedtime      Facility-Administered Medications: None       All inpatient meds reviewed   Current Facility-Administered Medications   Medication Dose Route Frequency    sodium chloride  flush 0.9 % injection 5-40 mL  5-40 mL IntraVENous 2 times per day    sodium chloride  flush 0.9 % injection 5-40 mL  5-40 mL IntraVENous PRN    0.9 % sodium chloride  infusion   IntraVENous PRN    enoxaparin  Sodium (LOVENOX ) injection 30 mg  30 mg SubCUTAneous Daily    ondansetron  (ZOFRAN -ODT) disintegrating tablet 4 mg  4 mg Oral Q8H PRN    Or    ondansetron  (ZOFRAN ) injection 4 mg  4 mg IntraVENous Q6H PRN    polyethylene glycol (GLYCOLAX ) packet 17 g  17 g Oral Daily PRN    acetaminophen  (TYLENOL ) tablet 650 mg  650 mg Oral Q6H PRN    Or    acetaminophen  (TYLENOL ) suppository 650 mg  650 mg Rectal Q6H PRN    amLODIPine  (NORVASC ) tablet 10 mg  10 mg Oral Daily    aspirin  chewable tablet 81 mg  81 mg Oral Daily    clopidogrel  (PLAVIX ) tablet 75 mg  75 mg Oral Daily    dicyclomine  (BENTYL ) tablet 20 mg  20 mg Oral Q6H PRN    DULoxetine  (CYMBALTA ) extended release capsule 60 mg  60 mg Oral Daily    metoprolol  succinate (TOPROL  XL) extended release tablet 50 mg  50 mg Oral Daily    pantoprazole  (PROTONIX ) tablet 40 mg  40 mg Oral BID AC    metoclopramide  (REGLAN ) injection 10 mg  10 mg IntraVENous 4x Daily AC & HS    insulin  lispro (HUMALOG ,ADMELOG ) injection vial 0-4 Units  0-4 Units SubCUTAneous 4x Daily AC & HS    lactated ringers  infusion   IntraVENous Continuous    HYDROmorphone  HCl PF (DILAUDID ) injection 1 mg  1 mg IntraVENous Q4H PRN    sennosides-docusate sodium  (SENOKOT-S) 8.6-50 MG tablet 1 tablet  1 tablet Oral BID    labetalol  (NORMODYNE ;TRANDATE ) injection  20 mg  20 mg IntraVENous Q4H PRN    sodium bicarbonate tablet 650 mg  650 mg Oral BID    prochlorperazine  (COMPAZINE ) injection 10 mg  10 mg IntraVENous  Q6H PRN       No Known Allergies      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 MAIN OR    COLONOSCOPY N/A 01/19/2023    COLONOSCOPY DIAGNOSTIC performed by Rocco Christians, MD at [facility] ENDOSCOPY    INVASIVE VASCULAR N/A 10/03/2023    Angiography visceral SMA performed by Maudie Sorrow, MD at [facility] CARDIAC CATH LAB    INVASIVE VASCULAR N/A 10/03/2023    Ultrasound guided vascular access performed by Maudie Sorrow, MD at [facility] CARDIAC CATH LAB    INVASIVE VASCULAR N/A 10/03/2023    Insert stent peripheral artery performed by Maudie Sorrow, MD at [facility] CARDIAC CATH LAB    IR NONTUNNELED VASCULAR CATHETER > 5 YEARS  10/10/2023    IR NONTUNNELED VASCULAR CATHETER > 5 YEARS 10/10/2023 [facility] CARDIAC CATH/EP/IR LAB    IR NONTUNNELED VASCULAR CATHETER > 5 YEARS  10/10/2023    IR NONTUNNELED VASCULAR CATHETER > 5 YEARS 10/10/2023 [facility] CARDIAC CATH/EP/IR LAB    OTHER SURGICAL HISTORY Left     Rentia attachment    TUBAL LIGATION Bilateral     UPPER GASTROINTESTINAL ENDOSCOPY N/A 01/17/2023    ESOPHAGOGASTRODUODENOSCOPY performed by Rocco Christians, MD at [facility] ENDOSCOPY    UPPER GASTROINTESTINAL ENDOSCOPY N/A 01/17/2023    ESOPHAGOGASTRODUODENOSCOPY BIOPSY performed by Rocco Christians, MD at [facility] ENDOSCOPY    UPPER GASTROINTESTINAL ENDOSCOPY N/A 01/18/2023    ESOPHAGOGASTRODUODENOSCOPY performed by Rocco Christians, MD at [facility] ENDOSCOPY    UPPER GASTROINTESTINAL ENDOSCOPY N/A 05/13/2023    ESOPHAGOGASTRODUODENOSCOPY  performed by Bynum Cassis, MD at [facility] ENDOSCOPY    UPPER GASTROINTESTINAL ENDOSCOPY N/A 05/13/2023    ESOPHAGOGASTRODUODENOSCOPY BIOPSY performed by Bynum Cassis, MD at [facility] ENDOSCOPY    UPPER GASTROINTESTINAL ENDOSCOPY N/A 09/21/2023    ESOPHAGOGASTRODUODENOSCOPY performed by Rocco Christians, MD at [facility] ENDOSCOPY    UPPER GASTROINTESTINAL ENDOSCOPY N/A 09/21/2023    ESOPHAGOGASTRODUODENOSCOPY BIOPSY performed by Rocco Christians, MD at [facility] ENDOSCOPY    US  ABSCESS DRAINAGE PERITONEAL/RETROPERITONEAL [facility]  02/11/2023    US  ABSCESS DRAINAGE PERITONEAL 02/11/2023 [facility] US        No family history on file.    Social History     Socioeconomic History    Marital status: Married   Tobacco Use    Smoking status: Never    Smokeless tobacco: Never   Vaping Use    Vaping status: Never Used   Substance and Sexual Activity    Alcohol use: Never    Drug use: Never    Sexual activity: Defer     Social Drivers of Health     Financial Resource Strain: Low Risk  (  02/01/2024)    Received from [facility]    Overall Financial Resource Strain (CARDIA)     Difficulty of Paying Living Expenses: Not hard at all   Food Insecurity: No Food Insecurity (02/18/2024)    Hunger Vital Sign     Worried About Running Out of Food in the Last Year: Never true     Ran Out of Food in the Last Year: Never true   Transportation Needs: No Transportation Needs (02/18/2024)    PRAPARE - Therapist, art (Medical): No     Lack of Transportation (Non-Medical): No   Housing Stability: Low Risk  (02/18/2024)    Housing Stability Vital Sign     Unable to Pay for Housing in the Last Year: No     Number of Times Moved in the Last Year: 0     Homeless in the Last Year: No         PHYSICAL EXAM:    Vitals:    02/18/24 0443 02/18/24 0700 02/18/24 0806 02/18/24 1150   BP: (!) 167/105  (!) 164/102 (!) 152/94   Pulse: 85 86 90 83   Resp: 14  17    Temp: 97.3 F (36.3 C)  97.2 F (36.2 C) 97.3 F (36.3 C)   TempSrc: Oral       SpO2: 94%  92% 93%   Weight:       Height:         No intake or output data in the 24 hours ending 02/18/24 1309    GENERAL: Able to communicate, not apparent distress  HEENT: AT NC PERLA   NECK: Supple, no elevated JVP  CARDIAC : S1 S2 heard,RRR, No M/R/G.  No parasternal heave.  RESPIRATORY: CTA bilaterally, respiratory effort normal  GI: Abdomen soft NT ND, positive bowel sounds  EXTREMITIES: No clubbing or cyanosis, pedal pulse 2 + b/l, No edema b/l   NEUROLOGY:Non focal on brief neuro exam  DIALYSIS ACCESS:       DATA:      .labs  Recent Labs     02/17/24  1937 02/17/24  2003 02/18/24  0412   NA  --  139 141   K  --  4.7 5.0   CL  --  113* 116*   CO2  --  20* 20*   GLUCOSE  --  166* 147*   BUN  --  39* 41*   CREATININE 2.7* 2.96* 2.85*   CALCIUM   --  8.5 8.2*       Recent Labs     02/17/24  2003   WBC 7.9   RBC 3.37*   HGB 9.1*   HCT 28.2*   MCV 83.7   MCH 27.0   MCHC 32.3   RDW 16.0*   PLT 516*   MPV 8.8*     Lab Results   Component Value Date/Time    IRON  64 10/31/2023 11:28 PM    TIBC 195 (L) 10/31/2023 11:28 PM     No results found for: "PTH"  Lab Results   Component Value Date/Time    LABA1C 6.8 (H) 10/24/2023 03:52 AM     Lab Results   Component Value Date/Time    COLORU YELLOW/STRAW 11/28/2023 04:04 PM    CLARITYU TURBID (A) 07/08/2021 07:06 PM    GLUCOSEU >1000 (A) 11/28/2023 04:04 PM    GLUCOSEU 250 (A) 01/16/2023 03:45 PM    BILIRUBINUR Negative 11/28/2023 04:04 PM  KETUA Negative 11/28/2023 04:04 PM    BLOODU SMALL (A) 11/28/2023 04:04 PM    PHUR 7.0 11/28/2023 04:04 PM    PHUR 6.0 07/08/2021 07:06 PM    PROTEINU >300 (A) 11/28/2023 04:04 PM    NITRU Negative 11/28/2023 04:04 PM    LEUKOCYTESUR MODERATE (A) 11/28/2023 04:04 PM     No components found for: "CULT"  No results found for: "MCA2", "MCAU2"  No results found for: "BNP", "BNPPOC", "BNPNT"    Ultrasound Result (most recent):  US  RETROPERITONEAL COMPLETE 09/09/2023    Narrative  EXAM: US  RETROPERITONEAL COMPLETE  ACC#:  [id number]    INDICATION: AKI on CKD, []     COMPARISON: CT of the abdomen/pelvis September 06, 2023    TECHNIQUE:  Real-time sonography of the kidneys, retroperitoneum and bladder was performed  with multiple static images obtained.    FINDINGS:  RIGHT KIDNEY:  The right kidney measures 10.1 x 4.5 x 4.6 cm in length.  The right kidney has  normal echogenicity with no mass, stone or hydronephrosis.    LEFT KIDNEY:  The left kidney measures 9.8 x 3.7 x 4.6 cm in length. The left kidney has  normal echogenicity with  no mass, stone or hydronephrosis.    RETROPERITONEUM:  The aorta is not well-visualized in the mid to distal aspects.  The aortic bifurcation is not well visualized.  The IVC is normal.  No retroperitoneal mass is identified.    BLADDER:  The urinary bladder is normal.    Impression  No hydronephrosis. No evidence of renal mass or stone.    Electronically signed by Vina Greaves    CT ABDOMEN PELVIS WO CONTRAST Additional Contrast? None  Narrative: CLINICAL HISTORY: epigastric pain   INDICATION: epigastric pain  COMPARISON: 10/21/2023  CONTRAST:  None.    TECHNIQUE:  Thin axial images were obtained through the abdomen and pelvis. Coronal and  sagittal reformats were generated. Oral contrast was not administered. CT dose  reduction was achieved through use of a standardized protocol tailored for this  examination and automatic exposure control for dose modulation.    The absence of intravenous contrast material significantly reduces the  sensitivity for evaluation of visceral organs and vasculature including presence  of small mass lesions, hemodynamically significant stenosis, dissections,  mucosal abnormalities etc.    FINDINGS:  LOWER THORAX: There is cardiomegaly. Moderate right and small left-sided pleural  effusion. Small pericardial effusion.  LIVER/GALLBLADDER: No mass.  CBD is not dilated.  SPLEEN/PANCREAS:  No mass lesion or splenomegaly  ADRENALS/KIDNEYS: Unremarkable. No obstructive renal calculus  or hydronephrosis.  STOMACH: Unremarkable.  SMALL BOWEL/COLON: Fecal stasis.  PERITONEUM: No ascites or pneumoperitoneum.  RETROPERITONEUM: Mesenteric atherosclerotic change. There is a mesenteric stent  at the SMA.  APPENDIX: Unremarkable.  BLADDER/REPRODUCTIVE ORGANS: No acute process.  BONES:  No acute fracture or dislocation.  ADDITIONAL COMMENTS: N/A  Impression: Moderate right and small left-sided pleural effusion with small pericardial  effusion.  Cardiomegaly.    Incidental and/or nonemergent findings are as described in detail above.    Electronically signed by Reford Canterbury HABIB  XR CHEST PORTABLE  Narrative: EXAM: XR CHEST PORTABLE  ACC#: [id number]     INDICATION: SOB, []      COMPARISON: Chest radiograph October 10, 2023    FINDINGS: AP portable chest radiograph. The lungs are adequately expanded. The  heart size is within normal limits. There is no focal airspace consolidation.  The vascular clarity is within normal limits. There is no evidence of pleural  effusion or pneumothorax. No displaced fracture is seen.  Impression: No acute cardiopulmonary findings.    Electronically signed by Achilles Achilles, MD, FASN  02/18/2024     [facility]    [address]  [place], [place]  [address]  Phone - [phone]  Fax - [phone]

## 2024-02-18 NOTE — Progress Notes (Signed)
 [facility] Dosing Services:  Per P&T Committee approved protocol, Metoclopramide  10mg  IV 4x daily has been adjusted to 5mg  IV 4x daily based on current renal function (CrCl~67ml/min). Pharmacy will continue to follow and dose adjust as needed.      Thank you,  Donnella Gain, Pharm.D.  Clinical Pharmacist  [facility]  [place]. [facility]

## 2024-02-19 LAB — CBC WITH AUTO DIFFERENTIAL
Basophils %: 1.2 % — ABNORMAL HIGH (ref 0.0–1.0)
Basophils Absolute: 0.08 10*3/uL (ref 0.00–0.10)
Eosinophils %: 4.5 % (ref 0.0–7.0)
Eosinophils Absolute: 0.29 10*3/uL (ref 0.00–0.40)
Hematocrit: 27.3 % — ABNORMAL LOW (ref 35.0–47.0)
Hemoglobin: 8.7 g/dL — ABNORMAL LOW (ref 11.5–16.0)
Immature Granulocytes %: 0.3 % (ref 0.0–0.5)
Immature Granulocytes Absolute: 0.02 10*3/uL (ref 0.00–0.04)
Lymphocytes %: 19.5 % (ref 12.0–49.0)
Lymphocytes Absolute: 1.27 10*3/uL (ref 0.80–3.50)
MCH: 27.4 pg (ref 26.0–34.0)
MCHC: 31.9 g/dL (ref 30.0–36.5)
MCV: 85.8 FL (ref 80.0–99.0)
MPV: 8.6 FL — ABNORMAL LOW (ref 8.9–12.9)
Monocytes %: 7.5 % (ref 5.0–13.0)
Monocytes Absolute: 0.49 10*3/uL (ref 0.00–1.00)
Neutrophils %: 67 % (ref 32.0–75.0)
Neutrophils Absolute: 4.36 10*3/uL (ref 1.80–8.00)
Nucleated RBCs: 0 /100{WBCs}
Platelets: 438 10*3/uL — ABNORMAL HIGH (ref 150–400)
RBC: 3.18 M/uL — ABNORMAL LOW (ref 3.80–5.20)
RDW: 15.7 % — ABNORMAL HIGH (ref 11.5–14.5)
WBC: 6.5 10*3/uL (ref 3.6–11.0)
nRBC: 0 10*3/uL (ref 0.00–0.01)

## 2024-02-19 LAB — BASIC METABOLIC PANEL
Anion Gap: 8 mmol/L (ref 2–12)
BUN/Creatinine Ratio: 11 — ABNORMAL LOW (ref 12–20)
BUN: 33 mg/dL — ABNORMAL HIGH (ref 6–20)
CO2: 17 mmol/L — ABNORMAL LOW (ref 21–32)
Calcium: 8.1 mg/dL — ABNORMAL LOW (ref 8.5–10.1)
Chloride: 111 mmol/L — ABNORMAL HIGH (ref 97–108)
Creatinine: 2.9 mg/dL — ABNORMAL HIGH (ref 0.55–1.02)
Est, Glom Filt Rate: 20 mL/min/{1.73_m2} — ABNORMAL LOW (ref >60)
Glucose: 157 mg/dL — ABNORMAL HIGH (ref 65–100)
Potassium: 4.5 mmol/L (ref 3.5–5.1)
Sodium: 136 mmol/L (ref 136–145)

## 2024-02-19 LAB — LACTIC ACID: Lactic Acid, Plasma: 0.6 MMOL/L (ref 0.4–2.0)

## 2024-02-19 LAB — ALBUMIN/CREATININE RATIO, URINE
Albumin Urine: 573 mg/dL
Albumin/Creatinine Ratio: 11083 mg/g — ABNORMAL HIGH (ref 0–30)
Creatinine, Ur: 51.7 mg/dL

## 2024-02-19 LAB — IRON AND TIBC
Iron % Saturation: 33 % (ref 20–50)
Iron: 57 ug/dL (ref 35–150)
TIBC: 171 ug/dL — ABNORMAL LOW (ref 250–450)

## 2024-02-19 LAB — MAGNESIUM: Magnesium: 1.6 mg/dL (ref 1.6–2.4)

## 2024-02-19 LAB — C-REACTIVE PROTEIN: CRP: <0.29 mg/dL (ref 0.00–0.30)

## 2024-02-19 LAB — FERRITIN: Ferritin: 518 ng/mL — ABNORMAL HIGH (ref 8–252)

## 2024-02-19 LAB — HEMOGLOBIN A1C
Estimated Avg Glucose: 154 mg/dL
Hemoglobin A1C: 7 % — ABNORMAL HIGH (ref 4.0–5.6)

## 2024-02-19 LAB — EKG 12-LEAD
Atrial Rate: 111 {beats}/min
P Axis: 28 degrees
P-R Interval: 136 ms
Q-T Interval: 366 ms
QRS Duration: 86 ms
QTc Calculation (Bazett): 497 ms
R Axis: -75 degrees
T Axis: 37 degrees
Ventricular Rate: 111 {beats}/min

## 2024-02-19 LAB — PHOSPHORUS: Phosphorus: 5.1 mg/dL — ABNORMAL HIGH (ref 2.6–4.7)

## 2024-02-19 LAB — POCT GLUCOSE
POC Glucose: 192 mg/dL — ABNORMAL HIGH (ref 65–117)
POC Glucose: 173 mg/dL — ABNORMAL HIGH (ref 65–117)
POC Glucose: 115 mg/dL (ref 65–117)
POC Glucose: 106 mg/dL (ref 65–117)

## 2024-02-19 MED FILL — AMLODIPINE BESYLATE 5 MG PO TABS: 5 MG | ORAL | Qty: 2 | Fill #0

## 2024-02-19 MED FILL — SENNOSIDES-DOCUSATE SODIUM 8.6-50 MG PO TABS: 8.6-50 MG | ORAL | Qty: 1 | Fill #0

## 2024-02-19 MED FILL — PANTOPRAZOLE SODIUM 40 MG PO TBEC: 40 MG | ORAL | Qty: 1 | Fill #0

## 2024-02-19 MED FILL — DICYCLOMINE HCL 20 MG PO TABS: 20 MG | ORAL | Qty: 1 | Fill #0

## 2024-02-19 MED FILL — ENOXAPARIN SODIUM 30 MG/0.3ML IJ SOSY: 30 MG/0.3ML | INTRAMUSCULAR | Qty: 0.3 | Fill #0

## 2024-02-19 MED FILL — METOCLOPRAMIDE HCL 5 MG/ML IJ SOLN: 5 MG/ML | INTRAMUSCULAR | Qty: 2 | Fill #0

## 2024-02-19 MED FILL — PROCHLORPERAZINE EDISYLATE 10 MG/2ML IJ SOLN: 10 MG/2ML | INTRAMUSCULAR | Qty: 2 | Fill #0

## 2024-02-19 MED FILL — SODIUM BICARBONATE 650 MG PO TABS: 650 MG | ORAL | Qty: 1 | Fill #0

## 2024-02-19 MED FILL — LABETALOL HCL 5 MG/ML IV SOLN: 5 MG/ML | INTRAVENOUS | Qty: 4 | Fill #0

## 2024-02-19 MED FILL — CLOPIDOGREL BISULFATE 75 MG PO TABS: 75 MG | ORAL | Qty: 1 | Fill #0

## 2024-02-19 MED FILL — INSULIN LISPRO 100 UNIT/ML IJ SOLN: 100 UNIT/ML | INTRAMUSCULAR | Qty: 1 | Fill #0

## 2024-02-19 MED FILL — HYDROMORPHONE HCL 1 MG/ML IJ SOLN: 1 MG/ML | INTRAMUSCULAR | Qty: 1 | Fill #0

## 2024-02-19 MED FILL — DULOXETINE HCL 30 MG PO CPEP: 30 MG | ORAL | Qty: 2 | Fill #0

## 2024-02-19 MED FILL — METOPROLOL SUCCINATE ER 50 MG PO TB24: 50 MG | ORAL | Qty: 1 | Fill #0

## 2024-02-19 MED FILL — ASPIRIN LOW DOSE 81 MG PO CHEW: 81 MG | ORAL | Qty: 1 | Fill #0

## 2024-02-19 MED FILL — ONDANSETRON HCL 4 MG/2ML IJ SOLN: 4 MG/2ML | INTRAMUSCULAR | Qty: 2 | Fill #0

## 2024-02-19 NOTE — Progress Notes (Signed)
 [place]  [facility]  [address] [place] [place], [place] [address]  [phone]        Hospitalist Progress Note      NAME: Melinda [name]   DOB:  [birth year]  MRM:  [id number]    Date/Time of service: 02/19/2024  11:06 AM       Subjective:     Chief Complaint:  Patient was personally seen and examined by me during this time period.  Chart reviewed.  Nausea, vomiting slightly better       Objective:       Vitals:       Last 24hrs VS reviewed since prior progress note. Most recent are:    Vitals:    02/19/24 1026   BP: 138/88   Pulse:    Resp:    Temp:    SpO2:      SpO2 Readings from Last 6 Encounters:   02/19/24 99%   11/28/23 100%   11/06/23 92%   10/25/23 96%   10/14/23 98%   09/22/23 98%          Intake/Output Summary (Last 24 hours) at 02/19/2024 1106  Last data filed at 02/19/2024 0140  Gross per 24 hour   Intake 1479.21 ml   Output --   Net 1479.21 ml        Exam:     Physical Exam:    Gen:  fraill, ill-appearing, mild distress  HEENT:  Pink conjunctivae, PERRL, hearing intact to voice, moist mucous membranes  Neck:  Supple, without masses, thyroid non-tender  Resp:  No accessory muscle use, clear breath sounds without wheezes rales or rhonchi  Card:  No murmurs, normal S1, S2 without thrills, bruits or peripheral edema  Abd:  Soft, mild diffused pain, non-distended, normoactive bowel sounds are present  Musc:  No cyanosis or clubbing  Skin:  No rashes  Neuro:  Cranial nerves 3-12 are grossly intact, follows commands appropriately  Psych:  fair insight, oriented to person, place and time, alert.  Spanish speaking    Medications Reviewed: (see below)    Lab Data Reviewed: (see below)    ______________________________________________________________________    Medications:     Current Facility-Administered Medications   Medication Dose Route Frequency    sodium chloride  flush 0.9 % injection 5-40 mL  5-40 mL IntraVENous 2 times per day    sodium chloride  flush 0.9 % injection 5-40 mL   5-40 mL IntraVENous PRN    0.9 % sodium chloride  infusion   IntraVENous PRN    enoxaparin  Sodium (LOVENOX ) injection 30 mg  30 mg SubCUTAneous Daily    ondansetron  (ZOFRAN -ODT) disintegrating tablet 4 mg  4 mg Oral Q8H PRN    Or    ondansetron  (ZOFRAN ) injection 4 mg  4 mg IntraVENous Q6H PRN    polyethylene glycol (GLYCOLAX ) packet 17 g  17 g Oral Daily PRN    acetaminophen  (TYLENOL ) tablet 650 mg  650 mg Oral Q6H PRN    Or    acetaminophen  (TYLENOL ) suppository 650 mg  650 mg Rectal Q6H PRN    amLODIPine  (NORVASC ) tablet 10 mg  10 mg Oral Daily    aspirin  chewable tablet 81 mg  81 mg Oral Daily    clopidogrel  (PLAVIX ) tablet 75 mg  75 mg Oral Daily    dicyclomine  (BENTYL ) tablet 20 mg  20 mg Oral Q6H PRN    DULoxetine  (CYMBALTA ) extended release capsule 60 mg  60 mg Oral Daily  metoprolol  succinate (TOPROL  XL) extended release tablet 50 mg  50 mg Oral Daily    pantoprazole  (PROTONIX ) tablet 40 mg  40 mg Oral BID AC    insulin  lispro (HUMALOG ,ADMELOG ) injection vial 0-4 Units  0-4 Units SubCUTAneous 4x Daily AC & HS    lactated ringers  infusion   IntraVENous Continuous    HYDROmorphone  HCl PF (DILAUDID ) injection 1 mg  1 mg IntraVENous Q4H PRN    sennosides-docusate sodium  (SENOKOT-S) 8.6-50 MG tablet 1 tablet  1 tablet Oral BID    labetalol  (NORMODYNE ;TRANDATE ) injection 20 mg  20 mg IntraVENous Q4H PRN    sodium bicarbonate tablet 650 mg  650 mg Oral BID    metoclopramide  (REGLAN ) injection 5 mg  5 mg IntraVENous 4x Daily AC & HS    prochlorperazine  (COMPAZINE ) injection 10 mg  10 mg IntraVENous Q6H PRN          Lab Review:     Recent Labs     02/17/24  2003 02/19/24  0502   WBC 7.9 6.5   HGB 9.1* 8.7*   HCT 28.2* 27.3*   PLT 516* 438*     Recent Labs     02/17/24  2003 02/18/24  0412 02/19/24  0502   NA 139 141 136   K 4.7 5.0 4.5   CL 113* 116* 111*   CO2 20* 20* 17*   BUN 39* 41* 33*   MG 1.8  --  1.6   PHOS  --   --  5.1*   ALT 33  --   --      No results found for: "GLUCPOC"       Assessment / Plan:     43  yo hx of HTN, DM, CKD 3, gastroparesis, cholecystectomy, distal SMA stenosis s/p vascular stenting, THC abuse, presented w/ recurrent, intractable abd pain, N/V, HTN urgency, AKI    # Recurrent/intractable abd pain/N/V: has severe gastroparesis.   Improving   Lipase 27 negative on admission  GI was consulted who recommended to continue hydration, and advance diet as tolerated  Plan  Continue IV reglan .    Cont IVF, IV Zofran  prn, IV compazine  prn.    Will advance to full liquid diet      #AKI/CKD 3: likely pre-renal.    Cont IVF (might switch to IV albumin  if volume overload).    Monitor BMP.    Nephrology consulted and following, appreciate recommendations    #Malignant HTN urgency: now improving.  Likely from N/V.  Cont norvasc , metoprolol .  Use IV labetalol  prn    #PAD: hx of SMA stent.  Cont ASA, plavix     #DM type 2: A1C 7.  Start SSI    #Mod pro-cal malnutrition: encourage PO as tolerated    # Anemia: likely due to dilution, chronic disease.  Will check iron  studies    **Prior records, notes, labs, radiology, and medications reviewed in [facility]**    Total time spent with patient care: 45 Minutes **I personally saw and examined the patient during this time period**                 Care Plan discussed with: Patient, nursing     Discussed:  Care Plan    Prophylaxis:  Lovenox     Disposition:  HH PT, OT, RN           ___________________________________________________    Attending Physician: Cisco Crest, MD

## 2024-02-19 NOTE — Plan of Care (Signed)
 Problem: Safety - Adult  Goal: Free from fall injury  Outcome: Progressing     Problem: Chronic Conditions and Co-morbidities  Goal: Patient's chronic conditions and co-morbidity symptoms are monitored and maintained or improved  Outcome: Progressing     Problem: Pain  Goal: Verbalizes/displays adequate comfort level or baseline comfort level  Outcome: Progressing     Problem: Cardiovascular - Adult  Goal: Maintains optimal cardiac output and hemodynamic stability  Outcome: Progressing     Problem: Skin/Tissue Integrity - Adult  Goal: Skin integrity remains intact  Outcome: Progressing     Problem: Gastrointestinal - Adult  Goal: Minimal or absence of nausea and vomiting  Outcome: Progressing  Goal: Maintains or returns to baseline bowel function  Outcome: Progressing  Goal: Maintains adequate nutritional intake  Outcome: Progressing     Problem: Metabolic/Fluid and Electrolytes - Adult  Goal: Electrolytes maintained within normal limits  Outcome: Progressing  Goal: Hemodynamic stability and optimal renal function maintained  Outcome: Progressing  Goal: Glucose maintained within prescribed range  Outcome: Progressing

## 2024-02-20 LAB — POCT GLUCOSE
POC Glucose: 182 mg/dL — ABNORMAL HIGH (ref 65–117)
POC Glucose: 137 mg/dL — ABNORMAL HIGH (ref 65–117)
POC Glucose: 189 mg/dL — ABNORMAL HIGH (ref 65–117)
POC Glucose: 143 mg/dL — ABNORMAL HIGH (ref 65–117)

## 2024-02-20 LAB — BASIC METABOLIC PANEL
Anion Gap: 7 mmol/L (ref 2–12)
BUN/Creatinine Ratio: 11 — ABNORMAL LOW (ref 12–20)
BUN: 33 mg/dL — ABNORMAL HIGH (ref 6–20)
CO2: 20 mmol/L — ABNORMAL LOW (ref 21–32)
Calcium: 8.2 mg/dL — ABNORMAL LOW (ref 8.5–10.1)
Chloride: 111 mmol/L — ABNORMAL HIGH (ref 97–108)
Creatinine: 3.08 mg/dL — ABNORMAL HIGH (ref 0.55–1.02)
Est, Glom Filt Rate: 19 mL/min/{1.73_m2} — ABNORMAL LOW (ref >60)
Glucose: 108 mg/dL — ABNORMAL HIGH (ref 65–100)
Potassium: 4.3 mmol/L (ref 3.5–5.1)
Sodium: 138 mmol/L (ref 136–145)

## 2024-02-20 MED FILL — METOCLOPRAMIDE HCL 5 MG/ML IJ SOLN: 5 MG/ML | INTRAMUSCULAR | Qty: 2 | Fill #0

## 2024-02-20 MED FILL — CLOPIDOGREL BISULFATE 75 MG PO TABS: 75 MG | ORAL | Qty: 1 | Fill #0

## 2024-02-20 MED FILL — METOPROLOL SUCCINATE ER 50 MG PO TB24: 50 MG | ORAL | Qty: 1 | Fill #0

## 2024-02-20 MED FILL — HYDROMORPHONE HCL 1 MG/ML IJ SOLN: 1 MG/ML | INTRAMUSCULAR | Qty: 1 | Fill #0

## 2024-02-20 MED FILL — SODIUM BICARBONATE 650 MG PO TABS: 650 MG | ORAL | Qty: 1 | Fill #0

## 2024-02-20 MED FILL — PANTOPRAZOLE SODIUM 40 MG PO TBEC: 40 MG | ORAL | Qty: 1 | Fill #0

## 2024-02-20 MED FILL — AMLODIPINE BESYLATE 5 MG PO TABS: 5 MG | ORAL | Qty: 2 | Fill #0

## 2024-02-20 MED FILL — SENNOSIDES-DOCUSATE SODIUM 8.6-50 MG PO TABS: 8.6-50 MG | ORAL | Qty: 1 | Fill #0

## 2024-02-20 MED FILL — INSULIN LISPRO 100 UNIT/ML IJ SOLN: 100 UNIT/ML | INTRAMUSCULAR | Qty: 1 | Fill #0

## 2024-02-20 MED FILL — ASPIRIN LOW DOSE 81 MG PO CHEW: 81 MG | ORAL | Qty: 1 | Fill #0

## 2024-02-20 MED FILL — DULOXETINE HCL 30 MG PO CPEP: 30 MG | ORAL | Qty: 2 | Fill #0

## 2024-02-20 MED FILL — ENOXAPARIN SODIUM 30 MG/0.3ML IJ SOSY: 30 MG/0.3ML | INTRAMUSCULAR | Qty: 0.3 | Fill #0

## 2024-02-20 NOTE — Plan of Care (Signed)
 Problem: Safety - Adult  Goal: Free from fall injury  02/20/2024 1600 by Annetta Barter, RN  Outcome: Progressing  02/20/2024 0339 by Benita Bramble, RN  Outcome: Progressing     Problem: Chronic Conditions and Co-morbidities  Goal: Patient's chronic conditions and co-morbidity symptoms are monitored and maintained or improved  02/20/2024 1600 by Annetta Barter, RN  Outcome: Progressing  02/20/2024 0339 by Benita Bramble, RN  Outcome: Progressing     Problem: Pain  Goal: Verbalizes/displays adequate comfort level or baseline comfort level  02/20/2024 1600 by Annetta Barter, RN  Outcome: Progressing  02/20/2024 0339 by Benita Bramble, RN  Outcome: Progressing     Problem: Cardiovascular - Adult  Goal: Maintains optimal cardiac output and hemodynamic stability  02/20/2024 1600 by Annetta Barter, RN  Outcome: Progressing  02/20/2024 0339 by Benita Bramble, RN  Outcome: Progressing     Problem: Skin/Tissue Integrity - Adult  Goal: Skin integrity remains intact  02/20/2024 1600 by Annetta Barter, RN  Outcome: Progressing  Flowsheets  Taken 02/20/2024 1506  Skin Integrity Remains Intact: Monitor for areas of redness and/or skin breakdown  Taken 02/20/2024 0755  Skin Integrity Remains Intact: Monitor for areas of redness and/or skin breakdown  02/20/2024 0339 by Benita Bramble, RN  Outcome: Progressing     Problem: Gastrointestinal - Adult  Goal: Minimal or absence of nausea and vomiting  02/20/2024 1600 by Annetta Barter, RN  Outcome: Progressing  02/20/2024 0339 by Benita Bramble, RN  Outcome: Progressing  Goal: Maintains or returns to baseline bowel function  02/20/2024 1600 by Annetta Barter, RN  Outcome: Progressing  02/20/2024 0339 by Benita Bramble, RN  Outcome: Progressing  Goal: Maintains adequate nutritional intake  02/20/2024 1600 by Annetta Barter, RN  Outcome: Progressing  02/20/2024 0339 by Benita Bramble, RN  Outcome: Progressing     Problem: Metabolic/Fluid and Electrolytes - Adult  Goal: Electrolytes maintained within  normal limits  02/20/2024 1600 by Annetta Barter, RN  Outcome: Progressing  02/20/2024 0339 by Benita Bramble, RN  Outcome: Progressing  Goal: Hemodynamic stability and optimal renal function maintained  02/20/2024 1600 by Annetta Barter, RN  Outcome: Progressing  02/20/2024 0339 by Benita Bramble, RN  Outcome: Progressing  Goal: Glucose maintained within prescribed range  02/20/2024 1600 by Annetta Barter, RN  Outcome: Progressing  02/20/2024 0339 by Benita Bramble, RN  Outcome: Progressing

## 2024-02-20 NOTE — Progress Notes (Signed)
 Melinda [name], ACNP-BC  [phone] office  [phone] voicemail     Gastroenterology Progress Note    Feb 20, 2024  Admit Date: 02/17/2024         Narrative Assessment and Plan   43 year old female with frequent admissions to [facility], who presents with nausea, vomiting, and abdominal pain. She has had a large workup in the past including upper endoscopy, colonoscopy, CTs, MRCP, M2A, gastric-emptying scans, etc. She is status post cholecystectomy and she had a gastric emptying scan notable for delayed emptying. She has been notable for positive THC in the past.     02/20/24 - Pain controlled, nausea improving. On IV reglan . Renal following for persistently high Cr which is 3.08 today.    Recommendations:  Advance to low fiber/GI diet  Convert to oral Reglan   Wean narcotics as able  If tolerating PO and pain controlled, OK to d/c from GI stanpoint, but she may need additional work up per Nephro?    Subjective:   Pain controlled with medicines. Nausea much improved. No vomiting. BM+ without hematochezia or melena. Feels hungry.    ROS:  The previous review of systems on initial consultation / H&P is noted and reviewed.  Specific changes noted above in HPI.    Current Medications:     Current Facility-Administered Medications   Medication Dose Route Frequency    sodium chloride  flush 0.9 % injection 5-40 mL  5-40 mL IntraVENous 2 times per day    sodium chloride  flush 0.9 % injection 5-40 mL  5-40 mL IntraVENous PRN    0.9 % sodium chloride  infusion   IntraVENous PRN    enoxaparin  Sodium (LOVENOX ) injection 30 mg  30 mg SubCUTAneous Daily    ondansetron  (ZOFRAN -ODT) disintegrating tablet 4 mg  4 mg Oral Q8H PRN    Or    ondansetron  (ZOFRAN ) injection 4 mg  4 mg IntraVENous Q6H PRN    polyethylene glycol (GLYCOLAX ) packet 17 g  17 g Oral Daily PRN    acetaminophen  (TYLENOL ) tablet 650 mg  650 mg Oral Q6H PRN    Or    acetaminophen  (TYLENOL ) suppository 650 mg  650 mg Rectal Q6H PRN     amLODIPine  (NORVASC ) tablet 10 mg  10 mg Oral Daily    aspirin  chewable tablet 81 mg  81 mg Oral Daily    clopidogrel  (PLAVIX ) tablet 75 mg  75 mg Oral Daily    dicyclomine  (BENTYL ) tablet 20 mg  20 mg Oral Q6H PRN    DULoxetine  (CYMBALTA ) extended release capsule 60 mg  60 mg Oral Daily    metoprolol  succinate (TOPROL  XL) extended release tablet 50 mg  50 mg Oral Daily    pantoprazole  (PROTONIX ) tablet 40 mg  40 mg Oral BID AC    insulin  lispro (HUMALOG ,ADMELOG ) injection vial 0-4 Units  0-4 Units SubCUTAneous 4x Daily AC & HS    HYDROmorphone  HCl PF (DILAUDID ) injection 1 mg  1 mg IntraVENous Q4H PRN    sennosides-docusate sodium  (SENOKOT-S) 8.6-50 MG tablet 1 tablet  1 tablet Oral BID    labetalol  (NORMODYNE ;TRANDATE ) injection 20 mg  20 mg IntraVENous Q4H PRN    sodium bicarbonate tablet 650 mg  650 mg Oral BID    metoclopramide  (REGLAN ) injection 5 mg  5 mg IntraVENous 4x Daily AC & HS    prochlorperazine  (COMPAZINE ) injection 10 mg  10 mg IntraVENous Q6H PRN       Objective:     VITALS:   Last  24hrs VS reviewed since prior progress note. Most recent are:  Vitals:    02/20/24 0844   BP:    Pulse:    Resp: 16   Temp:    SpO2:      Temp (24hrs), Avg:98.1 F (36.7 C), Min:97.7 F (36.5 C), Max:98.4 F (36.9 C)      Intake/Output Summary (Last 24 hours) at 02/20/2024 1022  Last data filed at 02/20/2024 0108  Gross per 24 hour   Intake 550 ml   Output 450 ml   Net 100 ml       EXAM:  General:  Comfortable, no distress    HEENT:  Atraumatic skull, pupils equal  Lungs:   No abnormal audible breath sounds.  Speaking in complete sentences  Heart:   No abnormal audible heart sounds.  Well perfused  Abdomen:  Non-distended, mildly tender.  No mass, guarding or rebound  Musc:   No skeletal defects or deformities  Neurologic:   Cranial nerves grossly intact, moves all 4 extremities  Psych:    Not anxious nor agitated  Derm:   No rash, jaundice, nor palpable dermatologic mass    Lab Data Reviewed:   Recent Labs      02/17/24  2003 02/19/24  0502   WBC 7.9 6.5   HGB 9.1* 8.7*   HCT 28.2* 27.3*   PLT 516* 438*     Recent Labs     02/17/24  2003 02/18/24  0412 02/19/24  0502 02/20/24  0442   NA 139 141 136 138   K 4.7 5.0 4.5 4.3   CL 113* 116* 111* 111*   CO2 20* 20* 17* 20*   BUN 39* 41* 33* 33*   MG 1.8  --  1.6  --    PHOS  --   --  5.1*  --    ALT 33  --   --   --      No results found for: "GLUCPOC"  No results for input(s): "PH", "PCO2", "PO2", "HCO3", "FIO2" in the last 72 hours.  No results for input(s): "INR" in the last 72 hours.        Assessment:   (See above)  Principal Problem:    AKI (acute kidney injury)  Resolved Problems:    * No resolved hospital problems. *      Plan:   (See above)      Signed By: Cyndy Dresser, APRN - NP     02/20/2024  10:22 AM

## 2024-02-20 NOTE — Consults (Signed)
 Session ID: [id number]  Language: Spanish  Interpreter ID: [id number]  Interpreter Name: Sarrah Cure

## 2024-02-20 NOTE — Plan of Care (Signed)
 Problem: Safety - Adult  Goal: Free from fall injury  Outcome: Progressing     Problem: Chronic Conditions and Co-morbidities  Goal: Patient's chronic conditions and co-morbidity symptoms are monitored and maintained or improved  Outcome: Progressing     Problem: Pain  Goal: Verbalizes/displays adequate comfort level or baseline comfort level  Outcome: Progressing     Problem: Cardiovascular - Adult  Goal: Maintains optimal cardiac output and hemodynamic stability  Outcome: Progressing     Problem: Skin/Tissue Integrity - Adult  Goal: Skin integrity remains intact  Outcome: Progressing     Problem: Gastrointestinal - Adult  Goal: Minimal or absence of nausea and vomiting  Outcome: Progressing  Goal: Maintains or returns to baseline bowel function  Outcome: Progressing  Goal: Maintains adequate nutritional intake  Outcome: Progressing     Problem: Metabolic/Fluid and Electrolytes - Adult  Goal: Electrolytes maintained within normal limits  Outcome: Progressing  Goal: Hemodynamic stability and optimal renal function maintained  Outcome: Progressing  Goal: Glucose maintained within prescribed range  Outcome: Progressing

## 2024-02-20 NOTE — Progress Notes (Signed)
 [place] [facility] [place]  Date of Birth: [birth year]          Assessment & Plan:     AKI +/- progression  CKD 3b du eto DN  HTN  Gastroparesis    Rec:  No acute indication HD  Continue gentle IVF  Encourage PO  Avoid nephrotoxins  Monitor serial labs         Subjective:   CC: AKI  HPI: Creat stable around 3. No sob/n/v     Current Facility-Administered Medications   Medication Dose Route Frequency    sodium chloride  flush 0.9 % injection 5-40 mL  5-40 mL IntraVENous 2 times per day    sodium chloride  flush 0.9 % injection 5-40 mL  5-40 mL IntraVENous PRN    0.9 % sodium chloride  infusion   IntraVENous PRN    enoxaparin  Sodium (LOVENOX ) injection 30 mg  30 mg SubCUTAneous Daily    ondansetron  (ZOFRAN -ODT) disintegrating tablet 4 mg  4 mg Oral Q8H PRN    Or    ondansetron  (ZOFRAN ) injection 4 mg  4 mg IntraVENous Q6H PRN    polyethylene glycol (GLYCOLAX ) packet 17 g  17 g Oral Daily PRN    acetaminophen  (TYLENOL ) tablet 650 mg  650 mg Oral Q6H PRN    Or    acetaminophen  (TYLENOL ) suppository 650 mg  650 mg Rectal Q6H PRN    amLODIPine  (NORVASC ) tablet 10 mg  10 mg Oral Daily    aspirin  chewable tablet 81 mg  81 mg Oral Daily    clopidogrel  (PLAVIX ) tablet 75 mg  75 mg Oral Daily    dicyclomine  (BENTYL ) tablet 20 mg  20 mg Oral Q6H PRN    DULoxetine  (CYMBALTA ) extended release capsule 60 mg  60 mg Oral Daily    metoprolol  succinate (TOPROL  XL) extended release tablet 50 mg  50 mg Oral Daily    pantoprazole  (PROTONIX ) tablet 40 mg  40 mg Oral BID AC    insulin  lispro (HUMALOG ,ADMELOG ) injection vial 0-4 Units  0-4 Units SubCUTAneous 4x Daily AC & HS    HYDROmorphone  HCl PF (DILAUDID ) injection 1 mg  1 mg IntraVENous Q4H PRN    sennosides-docusate sodium  (SENOKOT-S) 8.6-50 MG tablet 1 tablet  1 tablet Oral BID    labetalol  (NORMODYNE ;TRANDATE ) injection 20 mg  20 mg IntraVENous Q4H PRN    sodium bicarbonate tablet 650 mg  650 mg Oral BID    metoclopramide   (REGLAN ) injection 5 mg  5 mg IntraVENous 4x Daily AC & HS    prochlorperazine  (COMPAZINE ) injection 10 mg  10 mg IntraVENous Q6H PRN          Objective:     Vitals:  Blood pressure (!) 149/96, pulse 85, temperature 98.4 F (36.9 C), temperature source Oral, resp. rate 16, height 1.499 m (4\' 11" ), weight 60.7 kg (133 lb 14.4 oz), SpO2 95%.  Temp (24hrs), Avg:98.2 F (36.8 C), Min:97.7 F (36.5 C), Max:98.4 F (36.9 C)      Intake and Output:  No intake/output data recorded.  05/10 1901 - 05/12 0700  In: 1370.9 [P.O.:800; I.V.:570.9]  Out: 450 [Urine:450]    Physical Exam:               GENERAL ASSESSMENT: NAD  HEENT: Nontraumatic   CHEST: CTA  HEART: S1S2  ABDOMEN: Soft,NT  EXTREMITY: no EDEMA  NEURO: Grossly non focal          ECG/rhythm:  Data Review        Recent Labs     02/18/24  0412 02/19/24  0502 02/20/24  0442   NA 141 136 138   K 5.0 4.5 4.3   CL 116* 111* 111*   CO2 20* 17* 20*   BUN 41* 33* 33*   CREATININE 2.85* 2.90* 3.08*   GLUCOSE 147* 157* 108*   CALCIUM  8.2* 8.1* 8.2*           : Richardean Chancellor, MD  02/20/2024        [facility]:  [email or web address]  [email or web address]    [place] office:  [address] [place], [address]  [place], [place] [address]  Phone: [phone]  Fax :     [phone]    [facility] office:  [address]  [place], [place]  [address]  Phone - [phone]  Fax - [phone]

## 2024-02-20 NOTE — Progress Notes (Signed)
 Patient/caregivers speak Spanish as their preferred language for their healthcare communication. For safe communication, use the [employer] interpreter carts or call:      Lynne Logan  Senior Interpreter-Navigator [phone]  General phone: [phone] ( [phone])   Email: [email or web address]      Please always document the use of interpreting services (Interpreter's ID number) in your clinical notes.      Our interpreters are available for team members working with limited?English proficient (LEP) patients remotely, via phone or video or in person (if needed for special cases).      When using family members to interpret, for the safety of the patient and protection of the communication of both our patient and [facility] staff the VRI or telephonic interpreter should remain on the line to monitor that all communication is accurate and complete. The interpreter should be instructed to notify [facility] staff immediately if there are any inaccuracies.

## 2024-02-20 NOTE — Progress Notes (Signed)
 [place] [facility]  [address] [place] [place], [place] [address]  [phone]        Hospitalist Progress Note      NAME: Melinda [name]   DOB:  06/13/1981  MRM:  [id number]    Date/Time of service: 02/20/2024  9:50 AM       Subjective:     Chief Complaint:  Patient was personally seen and examined by me during this time period.  Chart reviewed.  Nausea, vomiting slightly better       Objective:       Vitals:       Last 24hrs VS reviewed since prior progress note. Most recent are:    Vitals:    02/20/24 0844   BP:    Pulse:    Resp: 16   Temp:    SpO2:      SpO2 Readings from Last 6 Encounters:   02/20/24 93%   11/28/23 100%   11/06/23 92%   10/25/23 96%   10/14/23 98%   09/22/23 98%          Intake/Output Summary (Last 24 hours) at 02/20/2024 0950  Last data filed at 02/20/2024 0108  Gross per 24 hour   Intake 550 ml   Output 450 ml   Net 100 ml        Exam:     Physical Exam:    Gen:  fraill, ill-appearing, mild distress  HEENT:  Pink conjunctivae, PERRL, hearing intact to voice, moist mucous membranes  Neck:  Supple, without masses, thyroid non-tender  Resp:  No accessory muscle use, clear breath sounds without wheezes rales or rhonchi  Card:  No murmurs, normal S1, S2 without thrills, bruits or peripheral edema  Abd:  Soft, mild diffused pain, non-distended, normoactive bowel sounds are present  Musc:  No cyanosis or clubbing  Skin:  No rashes  Neuro:  Cranial nerves 3-12 are grossly intact, follows commands appropriately  Psych:  fair insight, oriented to person, place and time, alert.  Spanish speaking    Medications Reviewed: (see below)    Lab Data Reviewed: (see below)    ______________________________________________________________________    Medications:     Current Facility-Administered Medications   Medication Dose Route Frequency    sodium chloride  flush 0.9 % injection 5-40 mL  5-40 mL IntraVENous 2 times per day    sodium chloride  flush 0.9 % injection 5-40 mL  5-40 mL  IntraVENous PRN    0.9 % sodium chloride  infusion   IntraVENous PRN    enoxaparin  Sodium (LOVENOX ) injection 30 mg  30 mg SubCUTAneous Daily    ondansetron  (ZOFRAN -ODT) disintegrating tablet 4 mg  4 mg Oral Q8H PRN    Or    ondansetron  (ZOFRAN ) injection 4 mg  4 mg IntraVENous Q6H PRN    polyethylene glycol (GLYCOLAX ) packet 17 g  17 g Oral Daily PRN    acetaminophen  (TYLENOL ) tablet 650 mg  650 mg Oral Q6H PRN    Or    acetaminophen  (TYLENOL ) suppository 650 mg  650 mg Rectal Q6H PRN    amLODIPine  (NORVASC ) tablet 10 mg  10 mg Oral Daily    aspirin  chewable tablet 81 mg  81 mg Oral Daily    clopidogrel  (PLAVIX ) tablet 75 mg  75 mg Oral Daily    dicyclomine  (BENTYL ) tablet 20 mg  20 mg Oral Q6H PRN    DULoxetine  (CYMBALTA ) extended release capsule 60 mg  60 mg Oral Daily  metoprolol  succinate (TOPROL  XL) extended release tablet 50 mg  50 mg Oral Daily    pantoprazole  (PROTONIX ) tablet 40 mg  40 mg Oral BID AC    insulin  lispro (HUMALOG ,ADMELOG ) injection vial 0-4 Units  0-4 Units SubCUTAneous 4x Daily AC & HS    HYDROmorphone  HCl PF (DILAUDID ) injection 1 mg  1 mg IntraVENous Q4H PRN    sennosides-docusate sodium  (SENOKOT-S) 8.6-50 MG tablet 1 tablet  1 tablet Oral BID    labetalol  (NORMODYNE ;TRANDATE ) injection 20 mg  20 mg IntraVENous Q4H PRN    sodium bicarbonate tablet 650 mg  650 mg Oral BID    metoclopramide  (REGLAN ) injection 5 mg  5 mg IntraVENous 4x Daily AC & HS    prochlorperazine  (COMPAZINE ) injection 10 mg  10 mg IntraVENous Q6H PRN          Lab Review:     Recent Labs     02/17/24  2003 02/19/24  0502   WBC 7.9 6.5   HGB 9.1* 8.7*   HCT 28.2* 27.3*   PLT 516* 438*     Recent Labs     02/17/24  2003 02/18/24  0412 02/19/24  0502 02/20/24  0442   NA 139 141 136 138   K 4.7 5.0 4.5 4.3   CL 113* 116* 111* 111*   CO2 20* 20* 17* 20*   BUN 39* 41* 33* 33*   MG 1.8  --  1.6  --    PHOS  --   --  5.1*  --    ALT 33  --   --   --      No results found for: "GLUCPOC"       Assessment / Plan:     43 yo hx of  HTN, DM, CKD 3, gastroparesis, cholecystectomy, distal SMA stenosis s/p vascular stenting, THC abuse, presented w/ recurrent, intractable abd pain, N/V, HTN urgency, AKI    # Recurrent/intractable abd pain/N/V: has severe gastroparesis.   Improved, tolerating full liquid    Lipase 27 negative on admission  GI was consulted who recommended to continue hydration, and advance diet as tolerated  Plan  Continue IV reglan .    Cont IVF, IV Zofran  prn, IV compazine  prn.    Will advance diet as tolerated       #AKI/CKD 3: likely pre-renal.    Cont IVF (might switch to IV albumin  if volume overload).    Monitor BMP.    Nephrology consulted and following, appreciate recommendations    #Malignant HTN urgency: now improving.  Likely from N/V.  Cont norvasc , metoprolol .  Use IV labetalol  prn    #PAD: hx of SMA stent.  Cont ASA, plavix     #DM type 2: A1C 7.  Start SSI    #Mod pro-cal malnutrition: encourage PO as tolerated    # Anemia: likely due to dilution, chronic disease.  Will check iron  studies    Possible d/c tomorrow     **Prior records, notes, labs, radiology, and medications reviewed in [facility]**    Total time spent with patient care: 45 Minutes **I personally saw and examined the patient during this time period**                 Care Plan discussed with: Patient, nursing     Discussed:  Care Plan    Prophylaxis:  Lovenox     Disposition:  HH PT, OT, RN           ___________________________________________________  Attending Physician: Cisco Crest, MD

## 2024-02-20 NOTE — Plan of Care (Signed)
 Problem: Safety - Adult  Goal: Free from fall injury  02/21/2024 0048 by Baruch Bosch, RN  Outcome: Progressing  02/20/2024 1600 by Annetta Barter, RN  Outcome: Progressing     Problem: Chronic Conditions and Co-morbidities  Goal: Patient's chronic conditions and co-morbidity symptoms are monitored and maintained or improved  02/21/2024 0048 by Baruch Bosch, RN  Outcome: Progressing  Flowsheets (Taken 02/20/2024 2015)  Care Plan - Patient's Chronic Conditions and Co-Morbidity Symptoms are Monitored and Maintained or Improved: Monitor and assess patient's chronic conditions and comorbid symptoms for stability, deterioration, or improvement  02/20/2024 1600 by Annetta Barter, RN  Outcome: Progressing     Problem: Pain  Goal: Verbalizes/displays adequate comfort level or baseline comfort level  02/21/2024 0048 by Baruch Bosch, RN  Outcome: Progressing  Flowsheets (Taken 02/20/2024 2015)  Verbalizes/displays adequate comfort level or baseline comfort level: Encourage patient to monitor pain and request assistance  02/20/2024 1600 by Annetta Barter, RN  Outcome: Progressing     Problem: Cardiovascular - Adult  Goal: Maintains optimal cardiac output and hemodynamic stability  02/21/2024 0048 by Baruch Bosch, RN  Outcome: Progressing  Flowsheets (Taken 02/20/2024 2015)  Maintains optimal cardiac output and hemodynamic stability: Monitor blood pressure and heart rate  02/20/2024 1600 by Annetta Barter, RN  Outcome: Progressing     Problem: Skin/Tissue Integrity - Adult  Goal: Skin integrity remains intact  Recent Flowsheet Documentation  Taken 02/20/2024 2015 by Baruch Bosch, RN  Skin Integrity Remains Intact: Monitor for areas of redness and/or skin breakdown  02/20/2024 1600 by Annetta Barter, RN  Outcome: Progressing  Flowsheets  Taken 02/20/2024 1506  Skin Integrity Remains Intact: Monitor for areas of redness and/or skin breakdown  Taken 02/20/2024 0755  Skin Integrity Remains Intact: Monitor for areas of redness  and/or skin breakdown     Problem: Gastrointestinal - Adult  Goal: Minimal or absence of nausea and vomiting  02/21/2024 0048 by Baruch Bosch, RN  Outcome: Progressing  02/20/2024 1600 by Annetta Barter, RN  Outcome: Progressing  Goal: Maintains or returns to baseline bowel function  02/21/2024 0048 by Baruch Bosch, RN  Outcome: Progressing  02/20/2024 1600 by Annetta Barter, RN  Outcome: Progressing  Goal: Maintains adequate nutritional intake  02/21/2024 0048 by Baruch Bosch, RN  Outcome: Progressing  02/20/2024 1600 by Annetta Barter, RN  Outcome: Progressing     Problem: Metabolic/Fluid and Electrolytes - Adult  Goal: Electrolytes maintained within normal limits  02/21/2024 0048 by Baruch Bosch, RN  Outcome: Progressing  Flowsheets (Taken 02/20/2024 2015)  Electrolytes maintained within normal limits: Monitor labs and assess patient for signs and symptoms of electrolyte imbalances  02/20/2024 1600 by Annetta Barter, RN  Outcome: Progressing  Goal: Hemodynamic stability and optimal renal function maintained  02/21/2024 0048 by Baruch Bosch, RN  Outcome: Progressing  02/20/2024 1600 by Annetta Barter, RN  Outcome: Progressing  Goal: Glucose maintained within prescribed range  02/21/2024 0048 by Baruch Bosch, RN  Outcome: Progressing  02/20/2024 1600 by Annetta Barter, RN  Outcome: Progressing

## 2024-02-21 LAB — BASIC METABOLIC PANEL
Anion Gap: 6 mmol/L (ref 2–12)
BUN/Creatinine Ratio: 10 — ABNORMAL LOW (ref 12–20)
BUN: 31 mg/dL — ABNORMAL HIGH (ref 6–20)
CO2: 21 mmol/L (ref 21–32)
Calcium: 8.1 mg/dL — ABNORMAL LOW (ref 8.5–10.1)
Chloride: 110 mmol/L — ABNORMAL HIGH (ref 97–108)
Creatinine: 3.17 mg/dL — ABNORMAL HIGH (ref 0.55–1.02)
Est, Glom Filt Rate: 18 mL/min/{1.73_m2} — ABNORMAL LOW (ref >60)
Glucose: 204 mg/dL — ABNORMAL HIGH (ref 65–100)
Potassium: 5.1 mmol/L (ref 3.5–5.1)
Sodium: 137 mmol/L (ref 136–145)

## 2024-02-21 LAB — CBC
Hematocrit: 27.6 % — ABNORMAL LOW (ref 35.0–47.0)
Hemoglobin: 9 g/dL — ABNORMAL LOW (ref 11.5–16.0)
MCH: 27.9 pg (ref 26.0–34.0)
MCHC: 32.6 g/dL (ref 30.0–36.5)
MCV: 85.4 FL (ref 80.0–99.0)
MPV: 8.5 FL — ABNORMAL LOW (ref 8.9–12.9)
Nucleated RBCs: 0 /100{WBCs}
Platelets: 443 10*3/uL — ABNORMAL HIGH (ref 150–400)
RBC: 3.23 M/uL — ABNORMAL LOW (ref 3.80–5.20)
RDW: 15.7 % — ABNORMAL HIGH (ref 11.5–14.5)
WBC: 6 10*3/uL (ref 3.6–11.0)
nRBC: 0 10*3/uL (ref 0.00–0.01)

## 2024-02-21 LAB — POCT GLUCOSE
POC Glucose: 181 mg/dL — ABNORMAL HIGH (ref 65–117)
POC Glucose: 200 mg/dL — ABNORMAL HIGH (ref 65–117)
POC Glucose: 140 mg/dL — ABNORMAL HIGH (ref 65–117)
POC Glucose: 185 mg/dL — ABNORMAL HIGH (ref 65–117)

## 2024-02-21 MED FILL — HYDROMORPHONE HCL 1 MG/ML IJ SOLN: 1 MG/ML | INTRAMUSCULAR | Qty: 1 | Fill #0

## 2024-02-21 MED FILL — PANTOPRAZOLE SODIUM 40 MG PO TBEC: 40 MG | ORAL | Qty: 1 | Fill #0

## 2024-02-21 MED FILL — METOCLOPRAMIDE HCL 5 MG/ML IJ SOLN: 5 MG/ML | INTRAMUSCULAR | Qty: 2 | Fill #0

## 2024-02-21 MED FILL — ENOXAPARIN SODIUM 30 MG/0.3ML IJ SOSY: 30 MG/0.3ML | INTRAMUSCULAR | Qty: 0.3 | Fill #0

## 2024-02-21 MED FILL — SODIUM BICARBONATE 650 MG PO TABS: 650 MG | ORAL | Qty: 1 | Fill #0

## 2024-02-21 MED FILL — METOPROLOL SUCCINATE ER 50 MG PO TB24: 50 MG | ORAL | Qty: 1 | Fill #0

## 2024-02-21 MED FILL — INSULIN LISPRO 100 UNIT/ML IJ SOLN: 100 UNIT/ML | INTRAMUSCULAR | Qty: 1 | Fill #0

## 2024-02-21 MED FILL — ONDANSETRON HCL 4 MG/2ML IJ SOLN: 4 MG/2ML | INTRAMUSCULAR | Qty: 2 | Fill #0

## 2024-02-21 MED FILL — AMLODIPINE BESYLATE 5 MG PO TABS: 5 MG | ORAL | Qty: 2 | Fill #0

## 2024-02-21 MED FILL — DULOXETINE HCL 30 MG PO CPEP: 30 MG | ORAL | Qty: 2 | Fill #0

## 2024-02-21 MED FILL — CLOPIDOGREL BISULFATE 75 MG PO TABS: 75 MG | ORAL | Qty: 1 | Fill #0

## 2024-02-21 MED FILL — SENNOSIDES-DOCUSATE SODIUM 8.6-50 MG PO TABS: 8.6-50 MG | ORAL | Qty: 1 | Fill #0

## 2024-02-21 MED FILL — ASPIRIN LOW DOSE 81 MG PO CHEW: 81 MG | ORAL | Qty: 1 | Fill #0

## 2024-02-21 NOTE — Consults (Signed)
 Session ID: [id number]  Language: Spanish  Interpreter ID: #[id number]  Interpreter Name: Suzzanna Estimable

## 2024-02-21 NOTE — Plan of Care (Signed)
 Problem: Safety - Adult  Goal: Free from fall injury  Outcome: Progressing     Problem: Chronic Conditions and Co-morbidities  Goal: Patient's chronic conditions and co-morbidity symptoms are monitored and maintained or improved  Outcome: Progressing  Flowsheets (Taken 02/21/2024 2010)  Care Plan - Patient's Chronic Conditions and Co-Morbidity Symptoms are Monitored and Maintained or Improved: Monitor and assess patient's chronic conditions and comorbid symptoms for stability, deterioration, or improvement     Problem: Pain  Goal: Verbalizes/displays adequate comfort level or baseline comfort level  Outcome: Progressing  Flowsheets (Taken 02/21/2024 2010)  Verbalizes/displays adequate comfort level or baseline comfort level: Encourage patient to monitor pain and request assistance     Problem: Cardiovascular - Adult  Goal: Maintains optimal cardiac output and hemodynamic stability  Outcome: Progressing  Flowsheets (Taken 02/21/2024 2010)  Maintains optimal cardiac output and hemodynamic stability: Monitor blood pressure and heart rate     Problem: Gastrointestinal - Adult  Goal: Minimal or absence of nausea and vomiting  Outcome: Progressing  Flowsheets (Taken 02/21/2024 2010)  Minimal or absence of nausea and vomiting: Administer IV fluids as ordered to ensure adequate hydration  Goal: Maintains or returns to baseline bowel function  Outcome: Progressing  Flowsheets (Taken 02/21/2024 2010)  Maintains or returns to baseline bowel function: Assess bowel function  Goal: Maintains adequate nutritional intake  Outcome: Progressing  Flowsheets (Taken 02/21/2024 2010)  Maintains adequate nutritional intake: Monitor percentage of each meal consumed     Problem: Metabolic/Fluid and Electrolytes - Adult  Goal: Electrolytes maintained within normal limits  Outcome: Progressing  Flowsheets (Taken 02/21/2024 2010)  Electrolytes maintained within normal limits: Monitor labs and assess patient for signs and symptoms of electrolyte  imbalances  Goal: Hemodynamic stability and optimal renal function maintained  Outcome: Progressing  Goal: Glucose maintained within prescribed range  Outcome: Progressing

## 2024-02-21 NOTE — Progress Notes (Signed)
 Eucharistic ministry visit.  Ms. Clure was in bed. She is Air traffic controller. No family was present at the time of the visit. Prayer and communion offered.      Chaplain Sr. Nanetta Baars, SBS, RN, ACSW, LCSW  Chaplain Page:  [phone])

## 2024-02-21 NOTE — Progress Notes (Signed)
 [facility]  [address] [place], [place] [address]  [phone]        Hospitalist Progress Note      NAME: Melinda [name]   DOB:  1980-10-19  MRM:  [id number]    Date/Time of service: 02/21/2024  10:50 AM       Subjective:     Chief Complaint:  Patient was personally seen and examined by me during this time period.  Chart reviewed.  Nausea, vomiting resolved       Objective:       Vitals:       Last 24hrs VS reviewed since prior progress note. Most recent are:    Vitals:    02/21/24 0738   BP: (!) 167/101   Pulse: 85   Resp: 16   Temp: 98.6 F (37 C)   SpO2: 90%     SpO2 Readings from Last 6 Encounters:   02/21/24 90%   11/28/23 100%   11/06/23 92%   10/25/23 96%   10/14/23 98%   09/22/23 98%        No intake or output data in the 24 hours ending 02/21/24 1050       Exam:     Physical Exam:    Gen:  fraill, ill-appearing, in no acute distress  HEENT:  Pink conjunctivae, PERRL, hearing intact to voice, moist mucous membranes  Neck:  Supple, without masses, thyroid non-tender  Resp:  No accessory muscle use, clear breath sounds without wheezes rales or rhonchi  Card:  No murmurs, normal S1, S2 without thrills, bruits or peripheral edema  Abd:  Soft, mild diffused pain, non-distended, normoactive bowel sounds are present  Musc:  No cyanosis or clubbing  Skin:  No rashes  Neuro:  Cranial nerves 3-12 are grossly intact, follows commands appropriately  Psych:  fair insight, oriented to person, place and time, alert.  Spanish speaking    Medications Reviewed: (see below)    Lab Data Reviewed: (see below)    ______________________________________________________________________    Medications:     Current Facility-Administered Medications   Medication Dose Route Frequency    sodium chloride  flush 0.9 % injection 5-40 mL  5-40 mL IntraVENous 2 times per day    sodium chloride  flush 0.9 % injection 5-40 mL  5-40 mL IntraVENous PRN    0.9 % sodium chloride  infusion   IntraVENous PRN     enoxaparin  Sodium (LOVENOX ) injection 30 mg  30 mg SubCUTAneous Daily    ondansetron  (ZOFRAN -ODT) disintegrating tablet 4 mg  4 mg Oral Q8H PRN    Or    ondansetron  (ZOFRAN ) injection 4 mg  4 mg IntraVENous Q6H PRN    polyethylene glycol (GLYCOLAX ) packet 17 g  17 g Oral Daily PRN    acetaminophen  (TYLENOL ) tablet 650 mg  650 mg Oral Q6H PRN    Or    acetaminophen  (TYLENOL ) suppository 650 mg  650 mg Rectal Q6H PRN    amLODIPine  (NORVASC ) tablet 10 mg  10 mg Oral Daily    aspirin  chewable tablet 81 mg  81 mg Oral Daily    clopidogrel  (PLAVIX ) tablet 75 mg  75 mg Oral Daily    dicyclomine  (BENTYL ) tablet 20 mg  20 mg Oral Q6H PRN    DULoxetine  (CYMBALTA ) extended release capsule 60 mg  60 mg Oral Daily    metoprolol  succinate (TOPROL  XL) extended release tablet 50 mg  50 mg Oral Daily    pantoprazole  (PROTONIX ) tablet  40 mg  40 mg Oral BID AC    insulin  lispro (HUMALOG ,ADMELOG ) injection vial 0-4 Units  0-4 Units SubCUTAneous 4x Daily AC & HS    HYDROmorphone  HCl PF (DILAUDID ) injection 1 mg  1 mg IntraVENous Q4H PRN    sennosides-docusate sodium  (SENOKOT-S) 8.6-50 MG tablet 1 tablet  1 tablet Oral BID    labetalol  (NORMODYNE ;TRANDATE ) injection 20 mg  20 mg IntraVENous Q4H PRN    sodium bicarbonate tablet 650 mg  650 mg Oral BID    metoclopramide  (REGLAN ) injection 5 mg  5 mg IntraVENous 4x Daily AC & HS    prochlorperazine  (COMPAZINE ) injection 10 mg  10 mg IntraVENous Q6H PRN          Lab Review:     Recent Labs     02/19/24  0502 02/21/24  0755   WBC 6.5 6.0   HGB 8.7* 9.0*   HCT 27.3* 27.6*   PLT 438* 443*     Recent Labs     02/19/24  0502 02/20/24  0442 02/21/24  0755   NA 136 138 137   K 4.5 4.3 5.1   CL 111* 111* 110*   CO2 17* 20* 21   BUN 33* 33* 31*   MG 1.6  --   --    PHOS 5.1*  --   --      No results found for: "GLUCPOC"       Assessment / Plan:     43 yo hx of HTN, DM, CKD 3, gastroparesis, cholecystectomy, distal SMA stenosis s/p vascular stenting, THC abuse, presented w/ recurrent, intractable  abd pain, N/V, HTN urgency, AKI    #AKI/CKD 3: likely pre-renal.    Cont IVF (might switch to IV albumin  if volume overload).    Monitor BMP.    Nephrology consulted and following, appreciate recommendations    # Recurrent/intractable abd pain/N/V: has severe gastroparesis.   Improved, tolerating full liquid    Lipase 27 negative on admission  GI was consulted who recommended to continue hydration, and advance diet as tolerated  Plan  Continue IV reglan .    Cont IVF, IV Zofran  prn, IV compazine  prn.    Will advance diet as tolerated     #Malignant HTN urgency: now improving.  Likely from N/V.  Cont norvasc , metoprolol .  Use IV labetalol  prn    #PAD: hx of SMA stent.  Cont ASA, plavix     #DM type 2: A1C 7.  Start SSI    #Mod pro-cal malnutrition: encourage PO as tolerated    # Anemia: likely due to dilution, chronic disease.  Will check iron  studies    Possible d/c tomorrow     **Prior records, notes, labs, radiology, and medications reviewed in [facility]**    Total time spent with patient care: 45 Minutes **I personally saw and examined the patient during this time period**                 Care Plan discussed with: Patient, nursing     Discussed:  Care Plan    Prophylaxis:  Lovenox     Disposition:  HH PT, OT, RN           ___________________________________________________    Attending Physician: Cisco Crest, MD

## 2024-02-21 NOTE — Progress Notes (Signed)
 Melinda [name], ACNP-BC  [phone] office  [phone] voicemail     Gastroenterology Progress Note    Feb 21, 2024  Admit Date: 02/17/2024         Narrative Assessment and Plan   43 year old female with frequent admissions to [facility], who presents with nausea, vomiting, and abdominal pain. She has had a large workup in the past including upper endoscopy, colonoscopy, CTs, MRCP, M2A, gastric-emptying scans, etc. She is status post cholecystectomy and she had a gastric emptying scan notable for delayed emptying. She has been notable for positive THC in the past.      02/21/24 - Tolerating fulls. No vomiting. Pain controlled at present. BM+ yesterday Cr continues to climb but not having excessive GI losses.      Recommendations:  Continue low fiber/GI diet and encourage oral fluids  Reglan  PO  Wean narcotics as able  Inpatient GI to sign off, please call with questions    Subjective:   Improving. No vomiting, tolerated full liquids.     ROS:  The previous review of systems on initial consultation / H&P is noted and reviewed.  Specific changes noted above in HPI.    Current Medications:     Current Facility-Administered Medications   Medication Dose Route Frequency    sodium chloride  flush 0.9 % injection 5-40 mL  5-40 mL IntraVENous 2 times per day    sodium chloride  flush 0.9 % injection 5-40 mL  5-40 mL IntraVENous PRN    0.9 % sodium chloride  infusion   IntraVENous PRN    enoxaparin  Sodium (LOVENOX ) injection 30 mg  30 mg SubCUTAneous Daily    ondansetron  (ZOFRAN -ODT) disintegrating tablet 4 mg  4 mg Oral Q8H PRN    Or    ondansetron  (ZOFRAN ) injection 4 mg  4 mg IntraVENous Q6H PRN    polyethylene glycol (GLYCOLAX ) packet 17 g  17 g Oral Daily PRN    acetaminophen  (TYLENOL ) tablet 650 mg  650 mg Oral Q6H PRN    Or    acetaminophen  (TYLENOL ) suppository 650 mg  650 mg Rectal Q6H PRN    amLODIPine  (NORVASC ) tablet 10 mg  10 mg Oral Daily    aspirin  chewable tablet 81 mg  81 mg Oral Daily     clopidogrel  (PLAVIX ) tablet 75 mg  75 mg Oral Daily    dicyclomine  (BENTYL ) tablet 20 mg  20 mg Oral Q6H PRN    DULoxetine  (CYMBALTA ) extended release capsule 60 mg  60 mg Oral Daily    metoprolol  succinate (TOPROL  XL) extended release tablet 50 mg  50 mg Oral Daily    pantoprazole  (PROTONIX ) tablet 40 mg  40 mg Oral BID AC    insulin  lispro (HUMALOG ,ADMELOG ) injection vial 0-4 Units  0-4 Units SubCUTAneous 4x Daily AC & HS    HYDROmorphone  HCl PF (DILAUDID ) injection 1 mg  1 mg IntraVENous Q4H PRN    sennosides-docusate sodium  (SENOKOT-S) 8.6-50 MG tablet 1 tablet  1 tablet Oral BID    labetalol  (NORMODYNE ;TRANDATE ) injection 20 mg  20 mg IntraVENous Q4H PRN    sodium bicarbonate tablet 650 mg  650 mg Oral BID    metoclopramide  (REGLAN ) injection 5 mg  5 mg IntraVENous 4x Daily AC & HS    prochlorperazine  (COMPAZINE ) injection 10 mg  10 mg IntraVENous Q6H PRN       Objective:     VITALS:   Last 24hrs VS reviewed since prior progress note. Most recent are:  Vitals:  02/21/24 1129   BP:    Pulse:    Resp: 18   Temp:    SpO2:      Temp (24hrs), Avg:98.1 F (36.7 C), Min:97.7 F (36.5 C), Max:98.6 F (37 C)    No intake or output data in the 24 hours ending 02/21/24 1143    EXAM:  General:  Comfortable, no distress    HEENT:  Atraumatic skull, pupils equal  Lungs:   No abnormal audible breath sounds.  Speaking in complete sentences  Heart:   No abnormal audible heart sounds.  Well perfused  Abdomen:  Non-distended, non-tender.  No mass, guarding or rebound  Musc:   No skeletal defects or deformities  Neurologic:   Cranial nerves grossly intact, moves all 4 extremities  Psych:    Good insight. Not anxious nor agitated  Derm:   No rash, jaundice, nor palpable dermatologic mass    Lab Data Reviewed:   Recent Labs     02/19/24  0502 02/21/24  0755   WBC 6.5 6.0   HGB 8.7* 9.0*   HCT 27.3* 27.6*   PLT 438* 443*     Recent Labs     02/19/24  0502 02/20/24  0442 02/21/24  0755   NA 136 138 137   K 4.5 4.3 5.1   CL  111* 111* 110*   CO2 17* 20* 21   BUN 33* 33* 31*   MG 1.6  --   --    PHOS 5.1*  --   --      No results found for: "GLUCPOC"  No results for input(s): "PH", "PCO2", "PO2", "HCO3", "FIO2" in the last 72 hours.  No results for input(s): "INR" in the last 72 hours.        Assessment:   (See above)  Principal Problem:    AKI (acute kidney injury)  Resolved Problems:    * No resolved hospital problems. *      Plan:   (See above)      Signed By: Cyndy Dresser, APRN - NP     02/21/2024  11:43 AM

## 2024-02-21 NOTE — Consults (Signed)
 Session ID: [id number]  Language: Spanish  Interpreter ID: #[id number]  Interpreter Name: Benancio Bracket

## 2024-02-21 NOTE — Progress Notes (Signed)
 Spiritual Health History and Assessment/Progress Note  [facility]    Initial Encounter,  ,  ,      Name: Melinda [name] MRN: [id number]    Age: 43 y.o.     Sex: female   Language: Spanish   Religion: Catholic   AKI (acute kidney injury)     Date: 02/21/2024            Total Time Calculated: 16 min              Spiritual Assessment began in [facility] B5 MULTI-SPECIALTY ONCOLOGY 1        Referral/Consult From: Rounding   Encounter Overview/Reason: Initial Encounter  Service Provided For: Patient    Faith, Belief, Meaning:   Patient is connected with a faith tradition or spiritual practice  Family/Friends No family/friends present      Importance and Influence:  Patient has spiritual/personal beliefs that influence decisions regarding their health  Family/Friends No family/friends present    Community:  Patient feels well-supported. Support system includes: Unknown  Family/Friends No family/friends present    Assessment and Plan of Care:   Patient Interventions include: Facilitated expression of thoughts and feelings and Provided sacramental/religious ritual  Family/Friends Interventions include: No family/friends present    Patient Plan of Care: Spiritual Care available upon further referral  Family/Friends Plan of Care: No family/friends present    Chart review. Set up interpreter machine to speak with patient. Patient is alone in hospital room. Patient is of the Catholic religion. Patient attends [employer] in [place]. Patient's religion is important to her and influences her morals, values, and ethics. Patient requested Rosary. Patient has a good support system. Patient shared medical issues and historical records. Provided active listening, ministry of presence, compassion, and prayers of support. Please contact spiritual health services for further assistance needed.    Electronically signed by Claudina Cullen, BCC on 02/21/2024 at 10:56 AM

## 2024-02-21 NOTE — Progress Notes (Signed)
 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 Making urine. She is eating. C/o back pain but no n/v/sob    Current Facility-Administered Medications   Medication Dose Route Frequency    sodium chloride  flush 0.9 % injection 5-40 mL  5-40 mL IntraVENous 2 times per day    sodium chloride  flush 0.9 % injection 5-40 mL  5-40 mL IntraVENous PRN    0.9 % sodium chloride  infusion   IntraVENous PRN    enoxaparin  Sodium (LOVENOX ) injection 30 mg  30 mg SubCUTAneous Daily    ondansetron  (ZOFRAN -ODT) disintegrating tablet 4 mg  4 mg Oral Q8H PRN    Or    ondansetron  (ZOFRAN ) injection 4 mg  4 mg IntraVENous Q6H PRN    polyethylene glycol (GLYCOLAX ) packet 17 g  17 g Oral Daily PRN    acetaminophen  (TYLENOL ) tablet 650 mg  650 mg Oral Q6H PRN    Or    acetaminophen  (TYLENOL ) suppository 650 mg  650 mg Rectal Q6H PRN    amLODIPine  (NORVASC ) tablet 10 mg  10 mg Oral Daily    aspirin  chewable tablet 81 mg  81 mg Oral Daily    clopidogrel  (PLAVIX ) tablet 75 mg  75 mg Oral Daily    dicyclomine  (BENTYL ) tablet 20 mg  20 mg Oral Q6H PRN    DULoxetine  (CYMBALTA ) extended release capsule 60 mg  60 mg Oral Daily    metoprolol  succinate (TOPROL  XL) extended release tablet 50 mg  50 mg Oral Daily    pantoprazole  (PROTONIX ) tablet 40 mg  40 mg Oral BID AC    insulin  lispro (HUMALOG ,ADMELOG ) injection vial 0-4 Units  0-4 Units SubCUTAneous 4x Daily AC & HS    HYDROmorphone  HCl PF (DILAUDID ) injection 1 mg  1 mg IntraVENous Q4H PRN    sennosides-docusate sodium  (SENOKOT-S) 8.6-50 MG tablet 1 tablet  1 tablet Oral BID    labetalol  (NORMODYNE ;TRANDATE ) injection 20 mg  20 mg IntraVENous Q4H PRN    sodium  bicarbonate tablet 650 mg  650 mg Oral BID    metoclopramide  (REGLAN ) injection 5 mg  5 mg IntraVENous 4x Daily AC & HS    prochlorperazine  (COMPAZINE ) injection 10 mg  10 mg IntraVENous Q6H PRN          Objective:     Vitals:  Blood pressure (!) 156/98, pulse 91, temperature 98.4 F (36.9 C), temperature source Oral, resp. rate 18, height 1.499 m (4\' 11" ), weight 58 kg (127 lb 13.9 oz), SpO2 91%.  Temp (24hrs), Avg:98.1 F (36.7 C), Min:97.7 F (36.5 C), Max:98.6 F (37 C)      Intake and Output:  No intake/output data recorded.  05/11 1901 - 05/13 0700  In: -   Out: 450 [Urine:450]    Physical Exam:               GENERAL ASSESSMENT: NAD  HEENT: Nontraumatic   CHEST: CTA  HEART: S1S2  ABDOMEN: Soft,NT  EXTREMITY: no EDEMA  NEURO: Grossly non focal  ECG/rhythm:    Data Review        Recent Labs     02/19/24  0502 02/20/24  0442 02/21/24  0755   NA 136 138 137   K 4.5 4.3 5.1   CL 111* 111* 110*   CO2 17* 20* 21   BUN 33* 33* 31*   CREATININE 2.90* 3.08* 3.17*   GLUCOSE 157* 108* 204*   CALCIUM  8.1* 8.2* 8.1*           : Richardean Chancellor, MD  02/21/2024        [facility]:  [email or web address]  [email or web address]    [place] office:  [address] [place], [address]  [place], [place] [address]  Phone: [phone]  Fax :     [phone]    [facility] office:  [address]  [place], [place]  [address]  Phone - [phone]  Fax - [phone]

## 2024-02-22 LAB — BASIC METABOLIC PANEL
Anion Gap: 5 mmol/L (ref 2–12)
BUN/Creatinine Ratio: 10 — ABNORMAL LOW (ref 12–20)
BUN: 34 mg/dL — ABNORMAL HIGH (ref 6–20)
CO2: 21 mmol/L (ref 21–32)
Calcium: 8.1 mg/dL — ABNORMAL LOW (ref 8.5–10.1)
Chloride: 110 mmol/L — ABNORMAL HIGH (ref 97–108)
Creatinine: 3.35 mg/dL — ABNORMAL HIGH (ref 0.55–1.02)
Est, Glom Filt Rate: 17 mL/min/{1.73_m2} — ABNORMAL LOW (ref >60)
Glucose: 136 mg/dL — ABNORMAL HIGH (ref 65–100)
Potassium: 4.9 mmol/L (ref 3.5–5.1)
Sodium: 136 mmol/L (ref 136–145)

## 2024-02-22 LAB — CBC
Hematocrit: 27.5 % — ABNORMAL LOW (ref 35.0–47.0)
Hemoglobin: 8.9 g/dL — ABNORMAL LOW (ref 11.5–16.0)
MCH: 27.6 pg (ref 26.0–34.0)
MCHC: 32.4 g/dL (ref 30.0–36.5)
MCV: 85.4 FL (ref 80.0–99.0)
MPV: 8.6 FL — ABNORMAL LOW (ref 8.9–12.9)
Nucleated RBCs: 0 /100{WBCs}
Platelets: 442 10*3/uL — ABNORMAL HIGH (ref 150–400)
RBC: 3.22 M/uL — ABNORMAL LOW (ref 3.80–5.20)
RDW: 15.7 % — ABNORMAL HIGH (ref 11.5–14.5)
WBC: 6.5 10*3/uL (ref 3.6–11.0)
nRBC: 0 10*3/uL (ref 0.00–0.01)

## 2024-02-22 LAB — POCT GLUCOSE
POC Glucose: 115 mg/dL (ref 65–117)
POC Glucose: 127 mg/dL — ABNORMAL HIGH (ref 65–117)
POC Glucose: 165 mg/dL — ABNORMAL HIGH (ref 65–117)
POC Glucose: 187 mg/dL — ABNORMAL HIGH (ref 65–117)
POC Glucose: 91 mg/dL (ref 65–117)

## 2024-02-22 MED ORDER — SODIUM CHLORIDE 0.9 % IV SOLN
0.9 | INTRAVENOUS | Status: DC
Start: 2024-02-22 — End: 2024-02-23
  Administered 2024-02-22 – 2024-02-23 (×3): via INTRAVENOUS

## 2024-02-22 MED ORDER — OXYCODONE HCL 5 MG PO TABS
5 | Freq: Four times a day (QID) | ORAL | Status: DC | PRN
Start: 2024-02-22 — End: 2024-02-23
  Administered 2024-02-22 – 2024-02-23 (×3): 5 mg via ORAL

## 2024-02-22 MED FILL — OXYCODONE HCL 5 MG PO TABS: 5 MG | ORAL | Qty: 1 | Fill #0

## 2024-02-22 MED FILL — METOPROLOL SUCCINATE ER 50 MG PO TB24: 50 MG | ORAL | Qty: 1 | Fill #0

## 2024-02-22 MED FILL — CLOPIDOGREL BISULFATE 75 MG PO TABS: 75 MG | ORAL | Qty: 1 | Fill #0

## 2024-02-22 MED FILL — SODIUM BICARBONATE 650 MG PO TABS: 650 MG | ORAL | Qty: 1 | Fill #0

## 2024-02-22 MED FILL — ENOXAPARIN SODIUM 30 MG/0.3ML IJ SOSY: 30 MG/0.3ML | INTRAMUSCULAR | Qty: 0.3 | Fill #0

## 2024-02-22 MED FILL — METOCLOPRAMIDE HCL 5 MG/ML IJ SOLN: 5 MG/ML | INTRAMUSCULAR | Qty: 2 | Fill #0

## 2024-02-22 MED FILL — SODIUM CHLORIDE 0.9 % IV SOLN: 0.9 % | INTRAVENOUS | Qty: 1000 | Fill #0

## 2024-02-22 MED FILL — ASPIRIN LOW DOSE 81 MG PO CHEW: 81 MG | ORAL | Qty: 1 | Fill #0

## 2024-02-22 MED FILL — AMLODIPINE BESYLATE 5 MG PO TABS: 5 MG | ORAL | Qty: 2 | Fill #0

## 2024-02-22 MED FILL — HYDROMORPHONE HCL 1 MG/ML IJ SOLN: 1 MG/ML | INTRAMUSCULAR | Qty: 1 | Fill #0

## 2024-02-22 MED FILL — PANTOPRAZOLE SODIUM 40 MG PO TBEC: 40 MG | ORAL | Qty: 1 | Fill #0

## 2024-02-22 MED FILL — SENNOSIDES-DOCUSATE SODIUM 8.6-50 MG PO TABS: 8.6-50 MG | ORAL | Qty: 1 | Fill #0

## 2024-02-22 MED FILL — DULOXETINE HCL 30 MG PO CPEP: 30 MG | ORAL | Qty: 2 | Fill #0

## 2024-02-22 NOTE — Progress Notes (Signed)
 [place] [facility]  [address] [place], [place] [address]  [phone]        Hospitalist Progress Note      NAME: Melinda [name]   DOB:  22-Aug-1981  MRM:  [id number]    Date/Time of service: 02/22/2024  10:46 AM       Subjective:     Chief Complaint:  Patient was personally seen and examined by me during this time period.  Chart reviewed.  Nausea, vomiting resolved       Objective:       Vitals:       Last 24hrs VS reviewed since prior progress note. Most recent are:    Vitals:    02/22/24 0953   BP: (!) 156/98   Pulse: 86   Resp:    Temp:    SpO2:      SpO2 Readings from Last 6 Encounters:   02/22/24 92%   11/28/23 100%   11/06/23 92%   10/25/23 96%   10/14/23 98%   09/22/23 98%          Intake/Output Summary (Last 24 hours) at 02/22/2024 1046  Last data filed at 02/21/2024 1428  Gross per 24 hour   Intake 240 ml   Output 900 ml   Net -660 ml          Exam:     Physical Exam:    Gen:  fraill, ill-appearing, in no acute distress  HEENT:  Pink conjunctivae, PERRL, hearing intact to voice, moist mucous membranes  Neck:  Supple, without masses, thyroid non-tender  Resp:  No accessory muscle use, clear breath sounds without wheezes rales or rhonchi  Card:  No murmurs, normal S1, S2 without thrills, bruits or peripheral edema  Abd:  Soft, mild diffused pain, non-distended, normoactive bowel sounds are present  Musc:  No cyanosis or clubbing  Skin:  No rashes  Neuro:  Cranial nerves 3-12 are grossly intact, follows commands appropriately  Psych:  fair insight, oriented to person, place and time, alert.  Spanish speaking    Medications Reviewed: (see below)    Lab Data Reviewed: (see below)    ______________________________________________________________________    Medications:     Current Facility-Administered Medications   Medication Dose Route Frequency    0.9 % sodium chloride  infusion   IntraVENous Continuous    oxyCODONE  (ROXICODONE ) immediate release tablet 5 mg  5 mg Oral Q6H PRN     sodium chloride  flush 0.9 % injection 5-40 mL  5-40 mL IntraVENous 2 times per day    sodium chloride  flush 0.9 % injection 5-40 mL  5-40 mL IntraVENous PRN    0.9 % sodium chloride  infusion   IntraVENous PRN    enoxaparin  Sodium (LOVENOX ) injection 30 mg  30 mg SubCUTAneous Daily    ondansetron  (ZOFRAN -ODT) disintegrating tablet 4 mg  4 mg Oral Q8H PRN    Or    ondansetron  (ZOFRAN ) injection 4 mg  4 mg IntraVENous Q6H PRN    polyethylene glycol (GLYCOLAX ) packet 17 g  17 g Oral Daily PRN    acetaminophen  (TYLENOL ) tablet 650 mg  650 mg Oral Q6H PRN    Or    acetaminophen  (TYLENOL ) suppository 650 mg  650 mg Rectal Q6H PRN    amLODIPine  (NORVASC ) tablet 10 mg  10 mg Oral Daily    aspirin  chewable tablet 81 mg  81 mg Oral Daily    clopidogrel  (PLAVIX ) tablet 75 mg  75 mg Oral  Daily    dicyclomine  (BENTYL ) tablet 20 mg  20 mg Oral Q6H PRN    DULoxetine  (CYMBALTA ) extended release capsule 60 mg  60 mg Oral Daily    metoprolol  succinate (TOPROL  XL) extended release tablet 50 mg  50 mg Oral Daily    pantoprazole  (PROTONIX ) tablet 40 mg  40 mg Oral BID AC    insulin  lispro (HUMALOG ,ADMELOG ) injection vial 0-4 Units  0-4 Units SubCUTAneous 4x Daily AC & HS    sennosides-docusate sodium  (SENOKOT-S) 8.6-50 MG tablet 1 tablet  1 tablet Oral BID    labetalol  (NORMODYNE ;TRANDATE ) injection 20 mg  20 mg IntraVENous Q4H PRN    sodium bicarbonate  tablet 650 mg  650 mg Oral BID    metoclopramide  (REGLAN ) injection 5 mg  5 mg IntraVENous 4x Daily AC & HS    prochlorperazine  (COMPAZINE ) injection 10 mg  10 mg IntraVENous Q6H PRN          Lab Review:     Recent Labs     02/21/24  0755 02/22/24  0348   WBC 6.0 6.5   HGB 9.0* 8.9*   HCT 27.6* 27.5*   PLT 443* 442*     Recent Labs     02/20/24  0442 02/21/24  0755 02/22/24  0348   NA 138 137 136   K 4.3 5.1 4.9   CL 111* 110* 110*   CO2 20* 21 21   BUN 33* 31* 34*     No results found for: "GLUCPOC"       Assessment / Plan:     43 yo hx of HTN, DM, CKD 3, gastroparesis,  cholecystectomy, distal SMA stenosis s/p vascular stenting, THC abuse, presented w/ recurrent, intractable abd pain, N/V, HTN urgency, AKI    #AKI/CKD 3: ? pre-renal.    Creatinine continues to be elevated, discussed with nephrologist on board, and recommended to continue hydration  Cont IVF (might switch to IV albumin  if volume overload).    Monitor BMP.    Nephrology consulted and following, appreciate recommendations    # Recurrent/intractable abd pain/N/V: has severe gastroparesis.   Improved, tolerating regular diet  Lipase 27 negative on admission  GI was consulted who recommended to continue hydration, and advance diet as tolerated  Plan  Continue IV reglan .    Cont IVF, IV Zofran  prn, IV compazine  prn.    Will advance diet as tolerated     #Malignant HTN urgency: now improving.  Likely from N/V.  Cont norvasc , metoprolol .  Use IV labetalol  prn    #PAD: hx of SMA stent.  Cont ASA, plavix     #DM type 2: A1C 7.    SSI    #Mod pro-cal malnutrition: encourage PO as tolerated    # Anemia: likely due to dilution, chronic disease.  Will check iron  studies    Possible d/c tomorrow     **Prior records, notes, labs, radiology, and medications reviewed in [facility]**    Total time spent with patient care: 45 Minutes **I personally saw and examined the patient during this time period**                 Care Plan discussed with: Patient, nursing     Discussed:  Care Plan    Prophylaxis:  Lovenox     Disposition:  HH PT, OT, RN           ___________________________________________________    Attending Physician: Cisco Crest, MD

## 2024-02-22 NOTE — Consults (Signed)
 Session ID: [id number]  Language: Spanish  Interpreter ID: #[id number]  Interpreter Name: Danney Dutton

## 2024-02-22 NOTE — Care Coordination-Inpatient (Signed)
 Care Management Initial Assessment  02/22/2024 4:44 PM  If patient is discharged prior to next notation, then this note serves as note for discharge by case management.    Reason for Admission:   Abdominal pain, epigastric [R10.13]  AKI (acute kidney injury) [N17.9]  Hypertensive urgency [I16.0]  Acute kidney injury superimposed on CKD [N17.9, N18.9]  Nausea and vomiting, unspecified vomiting type [R11.2]         Patient Admission Status: Inpatient  Date Admitted to INP: 02/17/2024  RUR: Readmission Risk Score: 25.7    Hospitalization in the last 30 days (Readmission):  No        Advance Care Planning:  Code Status: Full Code  Primary Healthcare Decision Maker: Legal Next of Kin  Primary Decision Maker: Darus Engels Child - [phone]       __________________________________________________________________________  Assessment: Completed with assitance of [employer] Language Interpreter Myrtie Atkinson [phone]  Comments: CM met with patient at bedside. Demographics and insurance confirmed. Patient lives at home with her husband and children. Pt's main contact is her daughter Darus Engels [phone]. PCP is [facility].     Discharge Concerns: [] Yes [x] No [] Unknown   Describe:    Financial concerns/barriers: [] Yes, explain: [x] No [] Unknown/Not discussed  __________________________________________________________________________    Insurer:   Active Insurance as of 02/17/2024       Patient has no active insurance coverage on file for 02/17/2024.            PCP: Adelfa Homes, APRN - NP   Address: [address] / [place] [address]   Phone number: [phone]    Pharmacy:   [facility] [address], [place] [address] [place] [phone] - F [phone]  [address]  [place] [address]  Phone: [phone] Fax: [phone]    DC Transport: (P) Family       Transition of care plan:    [x] Unable to determine at this time. Awaiting clinical progress, and disposition recommendations.    []  Home.  No assistance required.     []  Home. Pt refused recommended services.    []  Home with family assistance as needed, and outpatient follow-up.    []  Home with Outpatient PT and outpatient follow-up   Pt aware of OP appt? [] Yes, Provider:   [] Not scheduled   Transport provider:     []  Home with outpatient services.    Specify:    []  Home with Home Health   - Freedom of Choice offered? []  Yes, Preference:   []  NA    [] SNF/IPR   -[] Freedom of Choice offered, and preferences given:   [] Listing provided and preferences requested   -Status: [] Pending [] Accepted:    -Auth required: [] Yes [] No    -Auth initiated date:   -3 midnight stay required: [] Yes [] No  Date satisfied:     []  LTC:     []  Home with Hospice   - Freedom of Choice offered? []  Yes, Preference:   []  NA    []  Dispatch Health information provided.     []  Other:       [facility]  Case Management Department  For questions or concerns, please PerfectServe           02/22/24 1638   Service Assessment   Patient Orientation Alert and Oriented   Cognition Alert   History Provided By Patient   Support Systems Children;Spouse/Significant Other   PCP Verified by CM Yes  ([facility])   Last Visit to  PCP Within last 6 months   Current Functional Level Independent in ADLs/IADLs   Can patient return to prior living arrangement Yes   Ability to make needs known: Good   Family able to assist with home care needs: Yes   Financial Resources [employer] None   Social/Functional History   Lives With Spouse   Type of Home House   Bathroom Shower/Tub None   Bathroom Equipment None   Home Equipment None   Receives Help From Family   Prior Level of Assist for ADLs Independent   Prior Level of Assist for [employer] Independent   Ambulation Assistance Independent   Prior Level of Assist for Transfers Independent   Mode of Engineer, water   Discharge Planning   Type of Residence House   Living Arrangements Spouse/Significant Other   Current Services  Prior To Admission None   DME Ordered? No   Potential Assistance Purchasing Medications   ([facility])   Patient expects to be discharged to: [facility] At/After Discharge   Transition of Care Consult (CM Consult) N/A   Services At/After Discharge None   Confirm Follow Up Transport Family

## 2024-02-22 NOTE — Consults (Signed)
 Session ID: [id number]  Language: Spanish  Interpreter ID: #[id number]  Interpreter Name: Carole Churches

## 2024-02-22 NOTE — Progress Notes (Signed)
 [place] 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 No n/v/sob    Current Facility-Administered Medications   Medication Dose Route Frequency    0.9 % sodium chloride  infusion   IntraVENous Continuous    oxyCODONE  (ROXICODONE ) immediate release tablet 5 mg  5 mg Oral Q6H PRN    sodium chloride  flush 0.9 % injection 5-40 mL  5-40 mL IntraVENous 2 times per day    sodium chloride  flush 0.9 % injection 5-40 mL  5-40 mL IntraVENous PRN    0.9 % sodium chloride  infusion   IntraVENous PRN    enoxaparin  Sodium (LOVENOX ) injection 30 mg  30 mg SubCUTAneous Daily    ondansetron  (ZOFRAN -ODT) disintegrating tablet 4 mg  4 mg Oral Q8H PRN    Or    ondansetron  (ZOFRAN ) injection 4 mg  4 mg IntraVENous Q6H PRN    polyethylene glycol (GLYCOLAX ) packet 17 g  17 g Oral Daily PRN    acetaminophen  (TYLENOL ) tablet 650 mg  650 mg Oral Q6H PRN    Or    acetaminophen  (TYLENOL ) suppository 650 mg  650 mg Rectal Q6H PRN    amLODIPine  (NORVASC ) tablet 10 mg  10 mg Oral Daily    aspirin  chewable tablet 81 mg  81 mg Oral Daily    clopidogrel  (PLAVIX ) tablet 75 mg  75 mg Oral Daily    dicyclomine  (BENTYL ) tablet 20 mg  20 mg Oral Q6H PRN    DULoxetine  (CYMBALTA ) extended release capsule 60 mg  60 mg Oral Daily    metoprolol  succinate (TOPROL  XL) extended release tablet 50 mg  50 mg Oral Daily    pantoprazole  (PROTONIX ) tablet 40 mg  40 mg Oral BID AC    insulin  lispro (HUMALOG ,ADMELOG ) injection vial 0-4 Units  0-4 Units SubCUTAneous 4x Daily AC & HS    sennosides-docusate sodium  (SENOKOT-S) 8.6-50 MG tablet 1 tablet  1 tablet Oral BID    labetalol  (NORMODYNE ;TRANDATE ) injection 20 mg  20 mg IntraVENous Q4H PRN    sodium  bicarbonate tablet 650 mg  650 mg Oral BID    metoclopramide  (REGLAN ) injection 5 mg  5 mg IntraVENous 4x Daily AC & HS    prochlorperazine  (COMPAZINE ) injection 10 mg  10 mg IntraVENous Q6H PRN          Objective:     Vitals:  Blood pressure (!) 156/98, pulse 86, temperature 97.9 F (36.6 C), temperature source Oral, resp. rate 16, height 1.499 m (4\' 11" ), weight 58 kg (127 lb 13.9 oz), SpO2 92%.  Temp (24hrs), Avg:98.1 F (36.7 C), Min:97.9 F (36.6 C), Max:98.4 F (36.9 C)      Intake and Output:  No intake/output data recorded.  05/12 1901 - 05/14 0700  In: 240 [P.O.:240]  Out: 900 [Urine:900]    Physical Exam:               GENERAL ASSESSMENT: NAD  HEENT: Nontraumatic   CHEST: Unlabored  EXTREMITY: no EDEMA  NEURO: Grossly non focal  ECG/rhythm:    Data Review        Recent Labs     02/20/24  0442 02/21/24  0755 02/22/24  0348   NA 138 137 136   K 4.3 5.1 4.9   CL 111* 110* 110*   CO2 20* 21 21   BUN 33* 31* 34*   CREATININE 3.08* 3.17* 3.35*   GLUCOSE 108* 204* 136*   CALCIUM  8.2* 8.1* 8.1*           : Richardean Chancellor, MD  02/22/2024        [facility]:  [email or web address]  [email or web address]    [place] office:  [address] [place], [address]  [place], [place] [address]  Phone: [phone]  Fax :     [phone]    [facility] office:  [address]  [place], [place]  [address]  Phone - [phone]  Fax - [phone]

## 2024-02-22 NOTE — Plan of Care (Signed)
 Problem: Safety - Adult  Goal: Free from fall injury  Outcome: Progressing     Problem: Chronic Conditions and Co-morbidities  Goal: Patient's chronic conditions and co-morbidity symptoms are monitored and maintained or improved  Outcome: Progressing     Problem: Pain  Goal: Verbalizes/displays adequate comfort level or baseline comfort level  Outcome: Progressing  Flowsheets (Taken 02/22/2024 1811 by Daphney Eans, RN)  Verbalizes/displays adequate comfort level or baseline comfort level:   Encourage patient to monitor pain and request assistance   Assess pain using appropriate pain scale     Problem: Cardiovascular - Adult  Goal: Maintains optimal cardiac output and hemodynamic stability  Outcome: Progressing     Problem: Skin/Tissue Integrity - Adult  Goal: Skin integrity remains intact  Outcome: Progressing     Problem: Gastrointestinal - Adult  Goal: Minimal or absence of nausea and vomiting  Outcome: Progressing  Goal: Maintains or returns to baseline bowel function  Outcome: Progressing  Goal: Maintains adequate nutritional intake  Outcome: Progressing     Problem: Metabolic/Fluid and Electrolytes - Adult  Goal: Electrolytes maintained within normal limits  Outcome: Progressing  Goal: Hemodynamic stability and optimal renal function maintained  Outcome: Progressing  Goal: Glucose maintained within prescribed range  Outcome: Progressing

## 2024-02-23 LAB — POCT GLUCOSE
POC Glucose: 205 mg/dL — ABNORMAL HIGH (ref 65–117)
POC Glucose: 154 mg/dL — ABNORMAL HIGH (ref 65–117)
POC Glucose: 172 mg/dL — ABNORMAL HIGH (ref 65–117)

## 2024-02-23 LAB — BASIC METABOLIC PANEL
Anion Gap: 6 mmol/L (ref 2–12)
BUN/Creatinine Ratio: 10 — ABNORMAL LOW (ref 12–20)
BUN: 31 mg/dL — ABNORMAL HIGH (ref 6–20)
CO2: 20 mmol/L — ABNORMAL LOW (ref 21–32)
Calcium: 8.1 mg/dL — ABNORMAL LOW (ref 8.5–10.1)
Chloride: 110 mmol/L — ABNORMAL HIGH (ref 97–108)
Creatinine: 3.06 mg/dL — ABNORMAL HIGH (ref 0.55–1.02)
Est, Glom Filt Rate: 19 mL/min/{1.73_m2} — ABNORMAL LOW (ref >60)
Glucose: 151 mg/dL — ABNORMAL HIGH (ref 65–100)
Potassium: 5 mmol/L (ref 3.5–5.1)
Sodium: 136 mmol/L (ref 136–145)

## 2024-02-23 LAB — CULTURE, BLOOD 1: Culture: NO GROWTH

## 2024-02-23 LAB — PREGNANCY, URINE: Pregnancy, Urine: NEGATIVE

## 2024-02-23 LAB — UREA NITROGEN, URINE: Urea Nitrogen, Random Urine: 235 mg/dL

## 2024-02-23 LAB — CBC
Hematocrit: 29.5 % — ABNORMAL LOW (ref 35.0–47.0)
Hemoglobin: 9.7 g/dL — ABNORMAL LOW (ref 11.5–16.0)
MCH: 27.6 pg (ref 26.0–34.0)
MCHC: 32.9 g/dL (ref 30.0–36.5)
MCV: 84 FL (ref 80.0–99.0)
MPV: 8.4 FL — ABNORMAL LOW (ref 8.9–12.9)
Nucleated RBCs: 0 /100{WBCs}
Platelets: 459 10*3/uL — ABNORMAL HIGH (ref 150–400)
RBC: 3.51 M/uL — ABNORMAL LOW (ref 3.80–5.20)
RDW: 15.5 % — ABNORMAL HIGH (ref 11.5–14.5)
WBC: 8.3 10*3/uL (ref 3.6–11.0)
nRBC: 0 10*3/uL (ref 0.00–0.01)

## 2024-02-23 LAB — SODIUM, URINE, RANDOM: SODIUM, RANDOM URINE: 78 MMOL/L

## 2024-02-23 LAB — CULTURE, BLOOD 2: Culture: NO GROWTH

## 2024-02-23 LAB — URINE CULTURE HOLD SAMPLE

## 2024-02-23 MED ORDER — METOCLOPRAMIDE HCL 10 MG PO TABS
10 | Freq: Three times a day (TID) | ORAL | Status: DC
Start: 2024-02-23 — End: 2024-02-23
  Administered 2024-02-23: 16:00:00 5 mg via ORAL

## 2024-02-23 MED ORDER — SODIUM BICARBONATE 650 MG PO TABS
650 | Freq: Three times a day (TID) | ORAL | Status: DC
Start: 2024-02-23 — End: 2024-02-23
  Administered 2024-02-23: 16:00:00 650 mg via ORAL

## 2024-02-23 MED ORDER — METOCLOPRAMIDE HCL 5 MG PO TABS
5 | ORAL_TABLET | Freq: Three times a day (TID) | ORAL | 3 refills | 30.00000 days | Status: DC
Start: 2024-02-23 — End: 2024-07-20
  Filled 2024-02-23: qty 120, 40d supply, fill #0

## 2024-02-23 MED ORDER — SODIUM BICARBONATE 650 MG PO TABS
650 | ORAL_TABLET | Freq: Three times a day (TID) | ORAL | 0 refills | 30.00000 days | Status: DC
Start: 2024-02-23 — End: 2024-08-28
  Filled 2024-02-23: qty 90, 30d supply, fill #0

## 2024-02-23 MED ORDER — PANTOPRAZOLE SODIUM 40 MG PO TBEC
40 | ORAL_TABLET | Freq: Two times a day (BID) | ORAL | 3 refills | 90.00000 days | Status: DC
Start: 2024-02-23 — End: 2024-08-28
  Filled 2024-02-23: qty 30, 15d supply, fill #0

## 2024-02-23 MED ORDER — ONDANSETRON 4 MG PO TBDP
4 | ORAL_TABLET | Freq: Three times a day (TID) | ORAL | 0 refills | 7.00000 days | Status: DC | PRN
Start: 2024-02-23 — End: 2024-03-07
  Filled 2024-02-23: qty 30, 10d supply, fill #0

## 2024-02-23 MED FILL — ONDANSETRON HCL 4 MG/2ML IJ SOLN: 4 MG/2ML | INTRAMUSCULAR | Qty: 2 | Fill #0

## 2024-02-23 MED FILL — ASPIRIN LOW DOSE 81 MG PO CHEW: 81 MG | ORAL | Qty: 1 | Fill #0

## 2024-02-23 MED FILL — SENNOSIDES-DOCUSATE SODIUM 8.6-50 MG PO TABS: 8.6-50 MG | ORAL | Qty: 1 | Fill #0

## 2024-02-23 MED FILL — ACETAMINOPHEN 325 MG PO TABS: 325 MG | ORAL | Qty: 2 | Fill #0

## 2024-02-23 MED FILL — SODIUM BICARBONATE 650 MG PO TABS: 650 MG | ORAL | Qty: 1 | Fill #0

## 2024-02-23 MED FILL — AMLODIPINE BESYLATE 5 MG PO TABS: 5 MG | ORAL | Qty: 2 | Fill #0

## 2024-02-23 MED FILL — PANTOPRAZOLE SODIUM 40 MG PO TBEC: 40 MG | ORAL | Qty: 1 | Fill #0

## 2024-02-23 MED FILL — METOCLOPRAMIDE HCL 10 MG PO TABS: 10 MG | ORAL | Qty: 1 | Fill #0

## 2024-02-23 MED FILL — OXYCODONE HCL 5 MG PO TABS: 5 MG | ORAL | Qty: 1 | Fill #0

## 2024-02-23 MED FILL — INSULIN LISPRO 100 UNIT/ML IJ SOLN: 100 UNIT/ML | INTRAMUSCULAR | Qty: 2 | Fill #0

## 2024-02-23 MED FILL — ENOXAPARIN SODIUM 30 MG/0.3ML IJ SOSY: 30 MG/0.3ML | INTRAMUSCULAR | Qty: 0.3 | Fill #0

## 2024-02-23 MED FILL — METOCLOPRAMIDE HCL 5 MG/ML IJ SOLN: 5 MG/ML | INTRAMUSCULAR | Qty: 2 | Fill #0

## 2024-02-23 MED FILL — DICYCLOMINE HCL 20 MG PO TABS: 20 MG | ORAL | Qty: 1 | Fill #0

## 2024-02-23 MED FILL — METOPROLOL SUCCINATE ER 50 MG PO TB24: 50 MG | ORAL | Qty: 1 | Fill #0

## 2024-02-23 MED FILL — DULOXETINE HCL 30 MG PO CPEP: 30 MG | ORAL | Qty: 2 | Fill #0

## 2024-02-23 MED FILL — CLOPIDOGREL BISULFATE 75 MG PO TABS: 75 MG | ORAL | Qty: 1 | Fill #0

## 2024-02-23 NOTE — Progress Notes (Signed)
 Daughter aware to p/u pt meds from [facility] on 1st floor in [place].

## 2024-02-23 NOTE — Consults (Signed)
 Session ID: [id number]  Language: Spanish  Interpreter ID: #[id number]  Interpreter Name: Valetta Gaudy

## 2024-02-23 NOTE — Progress Notes (Signed)
 [place] 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  Acidosis     Rec:  Stop IVF  Increase sodium bicarb tabs   Encourage POI  Patient is eventually need ARB for renal protection  Okay to DC from renal stand  I will f/u in 2-4 weeks as outpatient  Avoid nephrotoxins  Monitor serial labs         Subjective:   CC: AKI  HPI:Cr sl better     Current Facility-Administered Medications   Medication Dose Route Frequency    metoclopramide  (REGLAN ) tablet 5 mg  5 mg Oral TID AC    sodium bicarbonate  tablet 650 mg  650 mg Oral TID    oxyCODONE  (ROXICODONE ) immediate release tablet 5 mg  5 mg Oral Q6H PRN    sodium chloride  flush 0.9 % injection 5-40 mL  5-40 mL IntraVENous 2 times per day    sodium chloride  flush 0.9 % injection 5-40 mL  5-40 mL IntraVENous PRN    0.9 % sodium chloride  infusion   IntraVENous PRN    enoxaparin  Sodium (LOVENOX ) injection 30 mg  30 mg SubCUTAneous Daily    ondansetron  (ZOFRAN -ODT) disintegrating tablet 4 mg  4 mg Oral Q8H PRN    Or    ondansetron  (ZOFRAN ) injection 4 mg  4 mg IntraVENous Q6H PRN    polyethylene glycol (GLYCOLAX ) packet 17 g  17 g Oral Daily PRN    acetaminophen  (TYLENOL ) tablet 650 mg  650 mg Oral Q6H PRN    Or    acetaminophen  (TYLENOL ) suppository 650 mg  650 mg Rectal Q6H PRN    amLODIPine  (NORVASC ) tablet 10 mg  10 mg Oral Daily    aspirin  chewable tablet 81 mg  81 mg Oral Daily    clopidogrel  (PLAVIX ) tablet 75 mg  75 mg Oral Daily    dicyclomine  (BENTYL ) tablet 20 mg  20 mg Oral Q6H PRN    DULoxetine  (CYMBALTA ) extended release capsule 60 mg  60 mg Oral Daily    metoprolol  succinate (TOPROL  XL) extended release tablet 50 mg  50 mg Oral Daily    pantoprazole  (PROTONIX ) tablet 40 mg  40 mg Oral BID AC    insulin  lispro (HUMALOG ,ADMELOG ) injection vial 0-4 Units  0-4  Units SubCUTAneous 4x Daily AC & HS    sennosides-docusate sodium  (SENOKOT-S) 8.6-50 MG tablet 1 tablet  1 tablet Oral BID    labetalol  (NORMODYNE ;TRANDATE ) injection 20 mg  20 mg IntraVENous Q4H PRN    prochlorperazine  (COMPAZINE ) injection 10 mg  10 mg IntraVENous Q6H PRN          Objective:     Vitals:  Blood pressure (!) 174/96, pulse 96, temperature 98.2 F (36.8 C), temperature source Oral, resp. rate 16, height 1.499 m (4\' 11" ), weight 58 kg (127 lb 13.9 oz), SpO2 93%.  Temp (24hrs), Avg:98.3 F (36.8 C), Min:98.1 F (36.7 C), Max:98.4 F (36.9 C)      Intake and Output:  05/15 0701 - 05/15 1900  In: 2379.2 [I.V.:2379.2]  Out: -   05/13 1901 - 05/15 0700  In: 760 [P.O.:760]  Out: 0     Physical Exam:  GENERAL ASSESSMENT: NAD  HEENT: Nontraumatic   CHEST: CTA  HEART: S1S2  ABDOMEN: Soft,NT  EXTREMITY: no EDEMA  NEURO: Grossly non focal          ECG/rhythm:    Data Review        Recent Labs     02/21/24  0755 02/22/24  0348 02/23/24  0804   NA 137 136 136   K 5.1 4.9 5.0   CL 110* 110* 110*   CO2 21 21 20*   BUN 31* 34* 31*   CREATININE 3.17* 3.35* 3.06*   GLUCOSE 204* 136* 151*   CALCIUM  8.1* 8.1* 8.1*           : Larsen Zettel Daymon Evans, MD  02/23/2024        [facility]:  [email or web address]  [email or web address]    [place] office:  [address] [place], [address]  [place], [place] [address]  Phone: [phone]  Fax :     [phone]    [facility] office:  [address]  [place], [place]  [address]  Phone - [phone]  Fax - [phone]

## 2024-02-23 NOTE — Discharge Summary (Signed)
 Discharge Summary       PATIENT ID: Melinda [name]  MRN: [id number]   DATE OF BIRTH: Oct 06, 1981    DATE OF ADMISSION: 02/17/2024  6:52 PM    DATE OF DISCHARGE: 02/23/2024 11:22 AM  PRIMARY CARE PROVIDER: Adelfa Homes, APRN - NP     ATTENDING PHYSICIAN: Opal Bill, MD  DISCHARGING PROVIDER: Opal Bill, MD    To contact this individual call [phone] and ask the operator to page.  If unavailable ask to be transferred the Adult Hospitalist Department.    CONSULTATIONS: IP CONSULT TO NEPHROLOGY  IP CONSULT TO GI    PROCEDURES/SURGERIES: * No surgery found *    ADMITTING DIAGNOSES & HOSPITAL COURSE:   43 yo hx of HTN, DM, CKD 3, gastroparesis, cholecystectomy, distal SMA stenosis s/p vascular stenting, THC abuse, presented w/ recurrent, intractable abd pain, N/V, HTN urgency, AKI     DISCHARGE DIAGNOSES / PLAN:      AKI/CKD 3: Pre-renal.    Received IV fluid ressucitation  Renal function stable high  Remains slightly acidotic  Bicarbonate started by nephrology    Follow up with nephrology in 2 weeks     Recurrent/intractable abd pain/N/V: has severe gastroparesis.   Improved, tolerating regular diet  Lipase 27 negative on admission  GI was consulted   Continue reglan  - dose changed fro renal function  Diet as tolerated      Malignant HTN urgency: now improving.  Likely from N/V.  Cont norvasc , metoprolol .       PAD: hx of SMA stent.  Cont ASA, plavix      DM type 2: A1C 7.    SSI     Mod pro-cal malnutrition: encourage PO as tolerated     Anemia: likely due to dilution, chronic disease. F/U pending iron  studies OP      VA POLST COMPLETED: No       PENDING TEST RESULTS:   At the time of discharge the following test results are still pending: Iron  studies    FOLLOW UP APPOINTMENTS:    Adelfa Homes, APRN - NP  [address]  [place] [address]  [phone]    Follow up in 3 day(s)      Jethava, Ashif K, MD  [address]  [place] [address]  [phone]    Follow up in 2 week(s)              ADDITIONAL CARE RECOMMENDATIONS: Return ot the Er for severe Nausea, vomiting, inability to tolerate food, chest pain, shortness of breath or other new symptoms. AVOID all NSAIDS - this was discussed extensively using spanish interpreter    DIET: regular diet ADULT DIET; Regular; GI Bland (GERD/Peptic Ulcer)    ACTIVITY: activity as tolerated      DISCHARGE MEDICATIONS:     Medication List        START taking these medications      sodium bicarbonate  650 MG tablet  Take 1 tablet by mouth 3 times daily            CHANGE how you take these medications      metoclopramide  5 MG tablet  Commonly known as: REGLAN   Take 1 tablet by mouth 3 times daily (before meals)  What changed:   medication strength  how much to take  when to take this     * ondansetron  4 MG disintegrating tablet  Commonly known as: ZOFRAN -ODT  Take 1 tablet by mouth every 8 hours  as needed for Nausea or Vomiting  What changed: Another medication with the same name was added. Make sure you understand how and when to take each.     * ondansetron  4 MG disintegrating tablet  Commonly known as: ZOFRAN -ODT  Take 1 tablet by mouth every 8 hours as needed for Nausea or Vomiting  What changed: You were already taking a medication with the same name, and this prescription was added. Make sure you understand how and when to take each.           * This list has 2 medication(s) that are the same as other medications prescribed for you. Read the directions carefully, and ask your doctor or other care provider to review them with you.                CONTINUE taking these medications      amitriptyline  10 MG tablet  Commonly known as: ELAVIL   Take 1 tablet by mouth nightly     amLODIPine  10 MG tablet  Commonly known as: NORVASC   Take 1 tablet by mouth daily     aspirin  81 MG chewable tablet  Take 1 tablet by mouth daily     busPIRone  15 MG tablet  Commonly known as: BUSPAR   Take 15 mg by mouth 3 times daily     clopidogrel  75 MG tablet  Commonly known as:  PLAVIX   Take 1 tablet by mouth daily     dicyclomine  20 MG tablet  Commonly known as: BENTYL   Take 1 tablet by mouth every 6 hours as needed (abdominal cramping)     DULoxetine  60 MG extended release capsule  Commonly known as: CYMBALTA   Take 1 capsule by mouth daily     linaclotide  290 MCG Caps capsule  Commonly known as: LINZESS   Take 1 capsule by mouth every morning (before breakfast)     metFORMIN  500 MG extended release tablet  Commonly known as: GLUCOPHAGE -XR  Take 2 tablets by mouth daily (with breakfast)     metoprolol  succinate 50 MG extended release tablet  Commonly known as: TOPROL  XL  Take 1 tablet by mouth daily     pantoprazole  40 MG tablet  Commonly known as: PROTONIX   Take 1 tablet by mouth 2 times daily (before meals)     polyethylene glycol 17 GM/SCOOP powder  Commonly known as: GLYCOLAX   Take 17 g by mouth 2 times daily     prochlorperazine  10 MG tablet  Commonly known as: COMPAZINE   Take 1 tablet by mouth every 6 hours as needed (nausea or vomiting)     sennosides-docusate sodium  8.6-50 MG tablet  Commonly known as: SENOKOT-S  Take 1 tablet by mouth in the morning and at bedtime            STOP taking these medications      lisinopril  40 MG tablet  Commonly known as: PRINIVIL ;ZESTRIL                Where to Get Your Medications        These medications were sent to [facility] [address], [place] - [address] [address]  [address] - [place] [phone] - [phone]  [address] [place]  [address], [place] [address]      Phone: [phone]   metoclopramide  5 MG tablet  ondansetron  4 MG disintegrating tablet  pantoprazole  40 MG tablet  sodium bicarbonate  650 MG tablet           NOTIFY  YOUR PHYSICIAN FOR ANY OF THE FOLLOWING:   Fever over 101 degrees for 24 hours.   Chest pain, shortness of breath, fever, chills, nausea, vomiting, diarrhea, change in mentation, falling, weakness, bleeding. Severe pain or pain not relieved by medications.  Or, any other signs or symptoms that you may  have questions about.    DISPOSITION:       x Home With:   OT  PT  HH  RN       Long term SNF/Inpatient Rehab    Independent/assisted living    Hospice    Other:       PATIENT CONDITION AT DISCHARGE:     Functional status    Poor     Deconditioned    x Independent      Cognition    x Lucid     Forgetful     Dementia      Catheters/lines (plus indication)    Foley     PICC     PEG    x None      PHYSICAL EXAMINATION AT DISCHARGE:    General : alert x 3, awake, no acute distress,   HEENT: PEERL, EOMI, moist mucus membrane  Neck: supple, no JVD, no meningeal signs  Chest: Clear to auscultation bilaterally   CVS: S1 S2 heard, Capillary refill less than 2 seconds  Abd: soft/ Non tender, non distended, BS physiological,   Ext: no clubbing, no cyanosis, no edema, brisk 2+ DP pulses  Neuro/Psych: pleasant mood and affect, CN 2-12 grossly intact  Skin: warm     CHRONIC MEDICAL DIAGNOSES:      Greater than 31 minutes were spent with the patient on counseling and coordination of care    Signed:   Opal Bill, MD  02/23/2024  11:22 AM

## 2024-02-23 NOTE — Consults (Signed)
 Session ID: [id number]  Language: Spanish  Interpreter ID: #[id number]  Interpreter Name: Gayle Kava

## 2024-02-23 NOTE — Progress Notes (Signed)
 Spiritual Health History and Assessment/Progress Note  [facility]    Rituals, Rites and Sacraments,  ,  ,      Name: Melinda [name] MRN: [id number]    Age: 43 y.o.     Sex: female   Language: Spanish   Religion: Catholic   AKI (acute kidney injury)     Date: 02/23/2024            Total Time Calculated: 5 min              Spiritual Assessment continued in [facility] B5 MULTI-SPECIALTY ONCOLOGY 1        Referral/Consult From: Clergy/Religious Leader   Encounter Overview/Reason: Rituals, Rites and Research scientist (life sciences) Provided For: Patient    Faith, Belief, Meaning:   Patient is connected with a faith tradition or spiritual practice  Family/Friends No family/friends present      Importance and Influence:  Patient has spiritual/personal beliefs that influence decisions regarding their health  Family/Friends No family/friends present    Community:  Patient is connected with a spiritual community  Family/Friends No family/friends present    Assessment and Plan of Care:     Patient Interventions include: Provided sacramental/religious ritual  Family/Friends Interventions include: No family/friends present    Patient Plan of Care: Spiritual Care available upon further referral  Family/Friends Plan of Care: Spiritual Care available upon further referral    Electronically signed by Nanetta Baars, Chaplain on 02/23/2024 at 2:51 PM     Eucharistic ministry visit.  Melinda [name] was sitting up in bed and said she was going home today. Prayer and communion offered.    Chaplain Sr. Nanetta Baars, SBS, RN, ACSW, LCSW  Chaplain Page:  [phone])

## 2024-02-24 LAB — GAMMOPATHY EVAL, SPEP/IFE, IG QT/FLC
A/G Ratio: 1.1 (ref 0.7–1.7)
Albumin: 2.4 g/dL — ABNORMAL LOW (ref 2.9–4.4)
Alpha-1-Globulin: 0.2 g/dL (ref 0.0–0.4)
Alpha-2-Globulin: 1 g/dL (ref 0.4–1.0)
Beta Globulin: 0.7 g/dL (ref 0.7–1.3)
Free Kappa Light Chains: 71.1 mg/L — ABNORMAL HIGH (ref 3.3–19.4)
Free Lambda Light Chains: 57.8 mg/L — ABNORMAL HIGH (ref 5.7–26.3)
Gamma Globulin: 0.4 g/dL (ref 0.4–1.8)
Globulin: 2.4 g/dL (ref 2.2–3.9)
IgA: 270 mg/dL (ref 87–352)
IgG, Serum: 543 mg/dL — ABNORMAL LOW (ref 586–1602)
IgM: 75 mg/dL (ref 26–217)
K/L Ratio: 1.23 (ref 0.26–1.65)
Total Protein: 4.8 g/dL — ABNORMAL LOW (ref 6.0–8.5)

## 2024-02-28 NOTE — Telephone Encounter (Signed)
 Placed call to patient to schedule hospital follow up appointment, no answer, no voice mail. Mailed patient letter requesting they contact office for a hospital follow up appointment.

## 2024-03-06 ENCOUNTER — Emergency Department: Admit: 2024-03-06 | Payer: MEDICAID | Primary: Adult Health

## 2024-03-06 ENCOUNTER — Inpatient Hospital Stay
Admission: EM | Admit: 2024-03-06 | Discharge: 2024-03-07 | Disposition: A | Discharge status: Home / Self Care | Payer: MEDICAID | Admitting: Internal Medicine

## 2024-03-06 DIAGNOSIS — I160 Hypertensive urgency: Secondary | ICD-10-CM

## 2024-03-06 DIAGNOSIS — I161 Hypertensive emergency: Principal | ICD-10-CM

## 2024-03-06 LAB — COMPREHENSIVE METABOLIC PANEL
ALT: 43 U/L (ref 12–78)
AST: 45 U/L — ABNORMAL HIGH (ref 15–37)
Albumin/Globulin Ratio: 0.6 — ABNORMAL LOW (ref 1.1–2.2)
Albumin: 2.2 g/dL — ABNORMAL LOW (ref 3.5–5.0)
Alk Phosphatase: 192 U/L — ABNORMAL HIGH (ref 45–117)
Anion Gap: 7 mmol/L (ref 2–12)
BUN/Creatinine Ratio: 17 (ref 12–20)
BUN: 61 mg/dL — ABNORMAL HIGH (ref 6–20)
CO2: 22 mmol/L (ref 21–32)
Calcium: 8.6 mg/dL (ref 8.5–10.1)
Chloride: 112 mmol/L — ABNORMAL HIGH (ref 97–108)
Creatinine: 3.64 mg/dL — ABNORMAL HIGH (ref 0.55–1.02)
Est, Glom Filt Rate: 15 mL/min/{1.73_m2} — ABNORMAL LOW (ref >60)
Globulin: 4 g/dL (ref 2.0–4.0)
Glucose: 169 mg/dL — ABNORMAL HIGH (ref 65–100)
Potassium: 4.3 mmol/L (ref 3.5–5.1)
Sodium: 141 mmol/L (ref 136–145)
Total Bilirubin: 0.4 mg/dL (ref 0.2–1.0)
Total Protein: 6.2 g/dL — ABNORMAL LOW (ref 6.4–8.2)

## 2024-03-06 LAB — LIPASE: Lipase: 33 U/L (ref 13–75)

## 2024-03-06 LAB — CBC WITH AUTO DIFFERENTIAL
Basophils %: 0.9 % (ref 0.0–1.0)
Basophils Absolute: 0.07 10*3/uL (ref 0.00–0.10)
Eosinophils %: 5.8 % (ref 0.0–7.0)
Eosinophils Absolute: 0.43 10*3/uL — ABNORMAL HIGH (ref 0.00–0.40)
Hematocrit: 27.8 % — ABNORMAL LOW (ref 35.0–47.0)
Hemoglobin: 9.5 g/dL — ABNORMAL LOW (ref 11.5–16.0)
Immature Granulocytes %: 0.3 % (ref 0.0–0.5)
Immature Granulocytes Absolute: 0.02 10*3/uL (ref 0.00–0.04)
Lymphocytes %: 17.4 % (ref 12.0–49.0)
Lymphocytes Absolute: 1.29 10*3/uL (ref 0.80–3.50)
MCH: 28.5 pg (ref 26.0–34.0)
MCHC: 34.2 g/dL (ref 30.0–36.5)
MCV: 83.5 FL (ref 80.0–99.0)
MPV: 8.9 FL (ref 8.9–12.9)
Monocytes %: 7.7 % (ref 5.0–13.0)
Monocytes Absolute: 0.57 10*3/uL (ref 0.00–1.00)
Neutrophils %: 67.9 % (ref 32.0–75.0)
Neutrophils Absolute: 5.05 10*3/uL (ref 1.80–8.00)
Nucleated RBCs: 0 /100{WBCs}
Platelets: 454 10*3/uL — ABNORMAL HIGH (ref 150–400)
RBC: 3.33 M/uL — ABNORMAL LOW (ref 3.80–5.20)
RDW: 15.7 % — ABNORMAL HIGH (ref 11.5–14.5)
WBC: 7.4 10*3/uL (ref 3.6–11.0)
nRBC: 0 10*3/uL (ref 0.00–0.01)

## 2024-03-06 LAB — EXTRA TUBES HOLD

## 2024-03-06 MED ORDER — NICARDIPINE HCL 2.5 MG/ML IV SOLN
2.5 | INTRAVENOUS | Status: DC
Start: 2024-03-06 — End: 2024-03-06
  Administered 2024-03-06: 23:00:00 5 mg/h via INTRAVENOUS

## 2024-03-06 MED ORDER — ACETAMINOPHEN 500 MG PO TABS
500 | ORAL | Status: DC
Start: 2024-03-06 — End: 2024-03-06

## 2024-03-06 MED ORDER — ONDANSETRON HCL 4 MG/2ML IJ SOLN
4 | INTRAMUSCULAR | Status: AC
Start: 2024-03-06 — End: 2024-03-06
  Administered 2024-03-06: 22:00:00 4 mg via INTRAVENOUS

## 2024-03-06 MED ORDER — MORPHINE SULFATE (PF) 4 MG/ML IJ SOLN
4 | INTRAMUSCULAR | Status: AC
Start: 2024-03-06 — End: 2024-03-06
  Administered 2024-03-06: 22:00:00 4 mg via INTRAVENOUS

## 2024-03-06 MED ORDER — SODIUM CHLORIDE 0.9 % IV BOLUS
0.9 | Freq: Once | INTRAVENOUS | Status: AC
Start: 2024-03-06 — End: 2024-03-06
  Administered 2024-03-06: 22:00:00 1000 mL via INTRAVENOUS

## 2024-03-06 MED FILL — SODIUM CHLORIDE 0.9 % IV SOLN: 0.9 % | INTRAVENOUS | Qty: 250 | Fill #0

## 2024-03-06 MED FILL — MORPHINE SULFATE 4 MG/ML IJ SOLN: 4 mg/mL | INTRAMUSCULAR | Qty: 1 | Fill #0

## 2024-03-06 MED FILL — SODIUM CHLORIDE 0.9 % IV SOLN: 0.9 % | INTRAVENOUS | Qty: 1000 | Fill #0

## 2024-03-06 MED FILL — ONDANSETRON HCL 4 MG/2ML IJ SOLN: 4 MG/2ML | INTRAMUSCULAR | Qty: 2 | Fill #0

## 2024-03-06 NOTE — ED Triage Notes (Signed)
 Translator in use for triage.     Patient ambulatory to ER triage for complaints for abdominal pain and back pain starting yesterday.     Endorses N/V.     Endorses dysuria.     Alexis PA in triage to assess patient.

## 2024-03-06 NOTE — ED Notes (Signed)
Pt to CT w/ CT tech

## 2024-03-06 NOTE — ED Notes (Incomplete)
 Pt SBP 162, Cardene  drip at 5mg /hr

## 2024-03-06 NOTE — Consults (Signed)
 Session ID: [id number]  Language: Spanish  Interpreter ID: #[id number]  Interpreter Name: Rogerio Clay

## 2024-03-06 NOTE — ED Provider Notes (Signed)
 [facility] A4 INTENSIVE CARE UNIT  EMERGENCY DEPARTMENT ENCOUNTER      Pt Name: Melinda [name]  MRN: [id number]  Birthdate 14-Jul-1981  Date of evaluation: 03/06/2024  Provider: Lorelei [name], PA-C    CHIEF COMPLAINT       Chief Complaint   Patient presents with    Abdominal Pain         HISTORY OF PRESENT ILLNESS   (Location/Symptom, Timing/Onset, Context/Setting, Quality, Duration, Modifying Factors, Severity)  Note limiting factors.   Melinda [name] is a 43 y.o. female with a PMHx of HTN, DM, CKD 3, gastroparesis, cholecystectomy, distal SMA stenosis s/p vascular stenting, THC abuse, presented w/ recurrent, intractable abd pain, N/V, HTN urgency, AKI who presents to the emergency department for back pain and vomiting since yesterday.  Her pain is localized generally throughout her abdomen.  Denies any fever, chills, diarrhea, constipation, chest pain or shortness of breath.  She states she has been unable to take her blood pressure medication this morning due to her profuse vomiting.    The history is provided by the patient.         Review of External Medical Records:     Nursing Notes were reviewed.    REVIEW OF SYSTEMS    (2-9 systems for level 4, 10 or more for level 5)     Review of Systems   Gastrointestinal:  Positive for abdominal pain and vomiting.       Except as noted above the remainder of the review of systems was reviewed and negative.       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)     Gastroparesis     Gastroparesis     GERD (gastroesophageal reflux disease)     High cholesterol     Hypertension          SURGICAL HISTORY       Past Surgical History:   Procedure Laterality Date    CAPSULE ENDOSCOPY N/A 01/20/2023    ESOPHAGEAL CAPSULE ENDOSCOPY remove at 1624PM performed by Rocco Christians, MD at [facility] ENDOSCOPY    CHOLECYSTECTOMY, LAPAROSCOPIC N/A 02/03/2023    ROBOTIC LAPAROSCOPIC  CHOLECYSTECTOMY with Indocyanine green  performed by Warren Haber, MD at [facility] MAIN OR    COLONOSCOPY N/A 01/19/2023    COLONOSCOPY DIAGNOSTIC performed by Rocco Christians, MD at [facility] ENDOSCOPY    INVASIVE VASCULAR N/A 10/03/2023    Angiography visceral SMA performed by Maudie Sorrow, MD at [facility] CARDIAC CATH LAB    INVASIVE VASCULAR N/A 10/03/2023    Ultrasound guided vascular access performed by Maudie Sorrow, MD at [facility] CARDIAC CATH LAB    INVASIVE VASCULAR N/A 10/03/2023    Insert stent peripheral artery performed by Maudie Sorrow, MD at [facility] CARDIAC CATH LAB    IR NONTUNNELED VASCULAR CATHETER > 5 YEARS  10/10/2023    IR NONTUNNELED VASCULAR CATHETER > 5 YEARS 10/10/2023 [facility] CARDIAC CATH/EP/IR LAB    IR NONTUNNELED VASCULAR CATHETER > 5 YEARS  10/10/2023    IR NONTUNNELED VASCULAR CATHETER > 5 YEARS 10/10/2023 [facility] CARDIAC CATH/EP/IR LAB    OTHER SURGICAL HISTORY Left     Rentia attachment    TUBAL LIGATION Bilateral     UPPER GASTROINTESTINAL ENDOSCOPY N/A 01/17/2023    ESOPHAGOGASTRODUODENOSCOPY performed by Rocco Christians, MD at [facility] ENDOSCOPY  UPPER GASTROINTESTINAL ENDOSCOPY N/A 01/17/2023    ESOPHAGOGASTRODUODENOSCOPY BIOPSY performed by Rocco Christians, MD at [facility] ENDOSCOPY    UPPER GASTROINTESTINAL ENDOSCOPY N/A 01/18/2023    ESOPHAGOGASTRODUODENOSCOPY performed by Rocco Christians, MD at [facility] ENDOSCOPY    UPPER GASTROINTESTINAL ENDOSCOPY N/A 05/13/2023    ESOPHAGOGASTRODUODENOSCOPY performed by Bynum Cassis, MD at [facility] ENDOSCOPY    UPPER GASTROINTESTINAL ENDOSCOPY N/A 05/13/2023    ESOPHAGOGASTRODUODENOSCOPY BIOPSY performed by Bynum Cassis, MD at [facility] ENDOSCOPY    UPPER GASTROINTESTINAL ENDOSCOPY N/A 09/21/2023    ESOPHAGOGASTRODUODENOSCOPY performed by Rocco Christians, MD at [facility] ENDOSCOPY    UPPER GASTROINTESTINAL ENDOSCOPY N/A 09/21/2023    ESOPHAGOGASTRODUODENOSCOPY BIOPSY performed by Rocco Christians, MD at [facility] ENDOSCOPY    US  ABSCESS DRAINAGE PERITONEAL/RETROPERITONEAL [facility]  02/11/2023    US  ABSCESS  DRAINAGE PERITONEAL 02/11/2023 [facility] RAD US          CURRENT MEDICATIONS       Discharge Medication List as of 03/07/2024  3:48 PM        CONTINUE these medications which have NOT CHANGED    Details   sodium bicarbonate  650 MG tablet Take 1 tablet by mouth 3 times daily, Disp-90 tablet, R-0Normal      metoclopramide  (REGLAN ) 5 MG tablet Take 1 tablet by mouth 3 times daily (before meals), Disp-120 tablet, R-3Normal      pantoprazole  (PROTONIX ) 40 MG tablet Take 1 tablet by mouth 2 times daily (before meals), Disp-30 tablet, R-3Normal      sennosides-docusate sodium  (SENOKOT-S) 8.6-50 MG tablet Take 1 tablet by mouth in the morning and at bedtime, Disp-60 tablet, R-1Normal      ondansetron  (ZOFRAN -ODT) 4 MG disintegrating tablet Take 1 tablet by mouth every 8 hours as needed for Nausea or Vomiting, Disp-30 tablet, R-0Normal      prochlorperazine  (COMPAZINE ) 10 MG tablet Take 1 tablet by mouth every 6 hours as needed (nausea or vomiting), Disp-120 tablet, R-3Normal      metFORMIN  (GLUCOPHAGE -XR) 500 MG extended release tablet Take 2 tablets by mouth daily (with breakfast), Disp-90 tablet, R-1Normal      busPIRone  (BUSPAR ) 15 MG tablet Take 15 mg by mouth 3 times daily, Disp-30 tablet, R-0Normal      DULoxetine  (CYMBALTA ) 60 MG extended release capsule Take 1 capsule by mouth daily, Disp-30 capsule, R-3Normal      clopidogrel  (PLAVIX ) 75 MG tablet Take 1 tablet by mouth daily, Disp-30 tablet, R-3Normal      aspirin  81 MG chewable tablet Take 1 tablet by mouth daily, Disp-30 tablet, R-3Print      linaclotide  (LINZESS ) 290 MCG CAPS capsule Take 1 capsule by mouth every morning (before breakfast), Disp-30 capsule, R-0Normal      polyethylene glycol (GLYCOLAX ) 17 GM/SCOOP powder Take 17 g by mouth 2 times daily, Disp-1530 g, R-1NO PRINT      dicyclomine  (BENTYL ) 20 MG tablet Take 1 tablet by mouth every 6 hours as needed (abdominal cramping), Disp-90 tablet, R-0Normal      metoprolol  succinate (TOPROL  XL) 50 MG extended release  tablet Take 1 tablet by mouth daily, Disp-30 tablet, R-2Normal      amLODIPine  (NORVASC ) 10 MG tablet Take 1 tablet by mouth daily, Disp-30 tablet, R-2Normal             ALLERGIES     Patient has no known allergies.    FAMILY HISTORY     No family history on file.       SOCIAL HISTORY       Social History  Socioeconomic History    Marital status: Married   Tobacco Use    Smoking status: Never    Smokeless tobacco: Never   Vaping Use    Vaping status: Never Used   Substance and Sexual Activity    Alcohol use: Never    Drug use: Never    Sexual activity: Defer     Social Drivers of Health     Financial Resource Strain: Low Risk  (02/01/2024)    Received from [facility]    Overall Financial Resource Strain (CARDIA)     Difficulty of Paying Living Expenses: Not hard at all   Food Insecurity: No Food Insecurity (03/07/2024)    Hunger Vital Sign     Worried About Running Out of Food in the Last Year: Never true     Ran Out of Food in the Last Year: Never true   Transportation Needs: No Transportation Needs (03/07/2024)    PRAPARE - Therapist, art (Medical): No     Lack of Transportation (Non-Medical): No   Housing Stability: Low Risk  (03/07/2024)    Housing Stability Vital Sign     Unable to Pay for Housing in the Last Year: No     Number of Times Moved in the Last Year: 0     Homeless in the Last Year: No           PHYSICAL EXAM    (up to 7 for level 4, 8 or more for level 5)     ED Triage Vitals   BP Systolic BP Percentile Diastolic BP Percentile Temp Temp src Pulse Resp SpO2   -- -- -- -- -- -- -- --      Height Weight         -- --             Body mass index is 25.65 kg/m.    Physical Exam  Vitals and nursing note reviewed.   Constitutional:       General: She is not in acute distress.     Appearance: Normal appearance. She is not ill-appearing.   HENT:      Head: Normocephalic.      Right Ear: External ear normal.      Left Ear: External ear normal.      Nose: No  congestion or rhinorrhea.      Mouth/Throat:      Mouth: Mucous membranes are moist.   Eyes:      General:         Right eye: No discharge.         Left eye: No discharge.   Cardiovascular:      Rate and Rhythm: Normal rate and regular rhythm.      Pulses: Normal pulses.      Heart sounds: Normal heart sounds. No murmur heard.     No friction rub. No gallop.   Pulmonary:      Effort: Pulmonary effort is normal.      Breath sounds: Normal breath sounds. No stridor. No wheezing, rhonchi or rales.   Abdominal:      General: Abdomen is flat. There is no distension.      Palpations: Abdomen is soft.      Tenderness: There is generalized abdominal tenderness. There is no right CVA tenderness, left CVA tenderness, guarding or rebound.   Musculoskeletal:      Cervical back: Normal range of motion.   Skin:  General: Skin is warm and dry.      Capillary Refill: Capillary refill takes less than 2 seconds.   Neurological:      General: No focal deficit present.      Mental Status: She is alert and oriented to person, place, and time.      Sensory: No sensory deficit.   Psychiatric:         Mood and Affect: Mood normal.         Behavior: Behavior normal.         DIAGNOSTIC RESULTS     EKG: All EKG's are interpreted by the Emergency Department Physician who either signs or Co-signs this chart in the absence of a cardiologist.        RADIOLOGY:   Non-plain film images such as CT, Ultrasound and MRI are read by the radiologist. Plain radiographic images are visualized and preliminarily interpreted by the emergency physician with the below findings:        Interpretation per the Radiologist below, if available at the time of this note:    CT ABDOMEN PELVIS WO CONTRAST Additional Contrast? None   Final Result   Cardiomegaly, moderate right and small left pleural effusion with pericardial   effusion as well.       No acute intraperitoneal process.   Incidental and/or nonemergent findings are as described in detail above.          Electronically signed by Reford Canterbury [facility]           LABS:  Labs Reviewed   CBC WITH AUTO DIFFERENTIAL - Abnormal; Notable for the following components:       Result Value    RBC 3.33 (*)     Hemoglobin 9.5 (*)     Hematocrit 27.8 (*)     RDW 15.7 (*)     Platelets 454 (*)     Eosinophils Absolute 0.43 (*)     All other components within normal limits   COMPREHENSIVE METABOLIC PANEL - Abnormal; Notable for the following components:    Chloride 112 (*)     Glucose 169 (*)     BUN 61 (*)     Creatinine 3.64 (*)     Est, Glom Filt Rate 15 (*)     AST 45 (*)     Alk Phosphatase 192 (*)     Total Protein 6.2 (*)     Albumin  2.2 (*)     Albumin /Globulin Ratio 0.6 (*)     All other components within normal limits   URINALYSIS WITH REFLEX TO CULTURE - Abnormal; Notable for the following components:    Protein, UA 300 (*)     Glucose, Ur 250 (*)     Blood, Urine SMALL (*)     Epithelial Cells, UA MODERATE (*)     All other components within normal limits   BASIC METABOLIC PANEL - Abnormal; Notable for the following components:    Chloride 112 (*)     Glucose 170 (*)     BUN 56 (*)     Creatinine 3.36 (*)     Est, Glom Filt Rate 17 (*)     Calcium  7.9 (*)     All other components within normal limits   CBC WITH AUTO DIFFERENTIAL - Abnormal; Notable for the following components:    RBC 2.91 (*)     Hemoglobin 8.2 (*)     Hematocrit 25.0 (*)     RDW 15.6 (*)  MPV 8.5 (*)     Eosinophils % 8.9 (*)     Eosinophils Absolute 0.61 (*)     All other components within normal limits   URINE DRUG SCREEN - Abnormal; Notable for the following components:    Opiates, Urine Positive (*)     All other components within normal limits   BRAIN NATRIURETIC PEPTIDE - Abnormal; Notable for the following components:    NT Pro-BNP >35,000 (*)     All other components within normal limits   POCT GLUCOSE - Abnormal; Notable for the following components:    POC Glucose 155 (*)     All other components within normal limits   POCT GLUCOSE - Abnormal;  Notable for the following components:    POC Glucose 170 (*)     All other components within normal limits   POCT GLUCOSE - Abnormal; Notable for the following components:    POC Glucose 174 (*)     All other components within normal limits   LIPASE   EXTRA TUBES HOLD       All other labs were within normal range or not returned as of this dictation.    EMERGENCY DEPARTMENT COURSE and DIFFERENTIAL DIAGNOSIS/MDM:   Vitals:    Vitals:    03/07/24 0900 03/07/24 1000 03/07/24 1050 03/07/24 1200   BP: (!) 152/100 139/88     Pulse: 93 88  89   Resp: 13 10  18    Temp:    97.7 F (36.5 C)   TempSrc:    Oral   SpO2: 98% 100%  98%   Weight:       Height:   1.499 m (4\' 11" )            Medical Decision Making  Melinda [name] is a 43 y.o. female with a PMHx of HTN, DM, CKD 3, gastroparesis, cholecystectomy, distal SMA stenosis s/p vascular stenting, THC abuse, presented w/ recurrent, intractable abd pain, N/V, HTN urgency, AKI who presents to the emergency department for back pain and vomiting since yesterday.  Her pain is localized generally throughout her abdomen.  Denies any fever, chills, diarrhea, constipation, chest pain or shortness of breath.  She states she has been unable to take her blood pressure medication this morning due to her profuse vomiting. Vitals in triage showed elevated blood pressure. Physical exam shows nontoxic female who is sitting comfortably.  Abdomen is tender to palpation diffusely throughout her abdomen however soft and nonperitoneal.  No guarding or rebound.  Clear lung sounds on auscultation bilaterally.  Clear cardiac sounds on auscultation.  Differential diagnosis includes but is not limited to  pancreatitis, PUD, gastritis, gastric perforation, pneumonia, hepatitis, cystitis, pyelonephritis, nephrolithiasis, acute appendicitis, vascular catastrophe, SBO, enteritis/colitis, diverticulitis.  Patient without history of vaginal discharge giving me a low suspicion for PID or TOA.   Considered and doubt ovarian torsion given history and presentation. Low suspicion for obstruction considering patient is passing gas, having bowel movement. Imaging performed today included CT of the abdomen pelvis without contrast due to patient's chronic renal disease showed cardiomegaly with bilateral pleural effusions.  Upon further chart review indicated this is patient's baseline compared to past imaging studies.  Labs performed today included CBC, CMP, lipase.  I discussed all results with the patient in detail today.  IV fluids, pain medication and Zofran  were administered to help fully reduce the patient's symptoms and high blood pressure.  Upon reassessment patient's blood pressure continued to remain elevated in the 210s systolic  range.  Will start a nicardipine  drip and admit to the floor for uncontrolled hypertension. Patient is being admitted to the hospital by Dr. Eloy Half, Service: Hospitalist.  The results of their tests and reasons for their admission have been discussed with them and available family. They convey agreement and understanding for the need to be admitted and for their admission diagnosis. Presentation, management, and disposition were discussed with the attending physician, Dr. Britta Candy who is in agreement with plan of care.  Total critical care time (not including time spent performing separately reportable procedures): 67 min    Amount and/or Complexity of Data Reviewed  Labs: ordered.  Radiology: ordered.    Risk  Prescription drug management.  Decision regarding hospitalization.            REASSESSMENT     ED Course as of 03/11/24 0905   Tue Mar 06, 2024   1740 I have independently viewed the obtained radiographic images and note CT abdomen pelvis with bilateral pleural effusion, right greater than left.  Also SMA stent in place. Will await radiology read. [JM]      ED Course User Index  [JM] Tretha Fu, MD           CONSULTS:  None    PROCEDURES:  Unless otherwise noted  below, none     Procedures      FINAL IMPRESSION      1. Hypertensive urgency    2. Nausea and vomiting, unspecified vomiting type    3. Flank pain          DISPOSITION/PLAN   DISPOSITION Admitted 03/06/2024 10:43:02 PM  Perfect Serve Consult for Admission  9:05 AM    ED Room Number: A464/01  Patient Name and age:  Melinda [name] 43 y.o.  female  Working Diagnosis:   1. Hypertensive urgency    2. Nausea and vomiting, unspecified vomiting type    3. Flank pain        COVID-19 Suspicion: No  Sepsis present:  No  Reassessment needed: No  Readmission: No  Isolation Requirements: no  Recommended Level of Care: med/surg  Department: [facility] - [phone]  Consulting Provider: Dr. Eloy Half     Other:  Melinda [name] is a 43 y.o. female with a PMHx of hypertensive urgency who presents to the emergency department for vomiting and flank pain. Upon arrival her SBP was 220. Her CT and labs showed no acute findings from her baseline.  She was placed on a nicardipine  drip and monitored. After monitoring her SBP was 140. She was taken off however her SBP continued to rise. She states she is complaint with her BP meds but did not take them today.      Total critical care time spent exclusive of procedures:  67 minutes.       PATIENT REFERRED TO:  No follow-up provider specified.    DISCHARGE MEDICATIONS:  Discharge Medication List as of 03/07/2024  3:48 PM            (Please note that portions of this note were completed with a voice recognition program.  Efforts were made to edit the dictations but occasionally words are mis-transcribed.)    Lorelei [name], PA-C (electronically signed)  Emergency Attending Physician / Physician Assistant / Nurse Practitioner             Lorelei [name], PA-C  03/11/24 [phone]

## 2024-03-06 NOTE — H&P (Addendum)
 Hospitalist Admission Note    NAME: Melinda [name]   DOB:  [birth year]   MRN:  [id number]     Date/Time:  03/06/2024 11:13 PM    Patient PCP: Adelfa Homes, APRN - NP    Please note that this dictation was completed with Dragon, the computer voice recognition software.  Quite often unanticipated grammatical, syntax, homophones, and other interpretive errors are inadvertently transcribed by the computer software.  Please disregard these errors.  Please excuse any errors that have escaped final proofreading  ________________________________________________________________________    Given the patient's current clinical presentation, I have a high level of concern for decompensation if discharged from the emergency department.  Complex decision making was performed, which includes reviewing the patient's available past medical records, laboratory results, and x-ray films.       My assessment of this patient's clinical condition and my plan of care is as follows.    Assessment / Plan:    HTN emergency, POA  Headache  --continue cardene  drip; ER had attempted to wean off when SBP down to 140 but BP rising back to 180s and patient actively heaving at this time, unable to tolerate po meds  --goal SBP 160-170    Gastroparesis, POA causing abdominal pain, intractable n/v  --IV reglan  5mg  q6h, prn zofran  and compazine   --check UDS as she had prior THC + on UDS 2/25    AKI on CKD 3  --control BP  --gentle IVF  --nephrology consult as Cr worsening since 1/25 when baseline was 2, now new baseline seems to be 3.    PAD hx SMA stent  --change asa to suppository and cont plavix  when able to take PO    DM 2  --medium dose SSI    Anemia  --hgb stable at 9    Medical Decision Making:   I have personally reviewed the radiographs, laboratory data in Epic and decisions and statements above are based partially on this personal interpretation.    Drug monitoring: cardene  drip      Code Status: Full Code, daughter Darus Engels  [phone]  DVT Prophylaxis: Lovenox   GI Prophylaxis: PPI         Subjective:   CHIEF COMPLAINT: "abdominal pain, back pain, n/v"    HISTORY OF PRESENT ILLNESS:   hx obtained using daughter Darus Engels as interpretor.  Patient refused offer for virtual Spanish interpretor services.  Melinda [name] is a 43 y.o.  female Spanish speaking with PMHx severe gastroparesis, HTN, DM 2, CKD 3, multiple admissions for intractable n/v, mostly recently about 2 weeks ago presents with epigastric pain, n/v every 15 minutes, unable to take meds in past 2 days.  Emesis clear liquid, last BM yesterday brown.  Pain radiate into her back.  +dysuria and dark urine.  No fever/chills, chest pain.  +occasional SOB.    In ER, BP 198/100, 244/149, HR 117-121, Cr 3.6, last 3 2 weeks ago.  CT abdomen/pelvis wo contrast with moderate right and small left pleural effusions and pericardial effusion, cardiomegaly, no acute intra-abdominal pathology.      Available records were reviewed at the time of H&P.        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)     Gastroparesis     Gastroparesis     GERD (gastroesophageal reflux disease)     High cholesterol     Hypertension  Past Surgical History:   Procedure Laterality Date    CAPSULE ENDOSCOPY N/A 01/20/2023    ESOPHAGEAL CAPSULE ENDOSCOPY remove at 1624PM performed by Rocco Christians, MD at [facility] ENDOSCOPY    CHOLECYSTECTOMY, LAPAROSCOPIC N/A 02/03/2023    ROBOTIC LAPAROSCOPIC CHOLECYSTECTOMY with Indocyanine green  performed by Warren Haber, MD at [facility] MAIN OR    COLONOSCOPY N/A 01/19/2023    COLONOSCOPY DIAGNOSTIC performed by Rocco Christians, MD at [facility] ENDOSCOPY    INVASIVE VASCULAR N/A 10/03/2023    Angiography visceral SMA performed by Maudie Sorrow, MD at [facility] CARDIAC CATH LAB    INVASIVE VASCULAR N/A 10/03/2023    Ultrasound guided vascular access performed by Maudie Sorrow, MD at [facility] CARDIAC CATH LAB     INVASIVE VASCULAR N/A 10/03/2023    Insert stent peripheral artery performed by Maudie Sorrow, MD at [facility] CARDIAC CATH LAB    IR NONTUNNELED VASCULAR CATHETER > 5 YEARS  10/10/2023    IR NONTUNNELED VASCULAR CATHETER > 5 YEARS 10/10/2023 [facility] CARDIAC CATH/EP/IR LAB    IR NONTUNNELED VASCULAR CATHETER > 5 YEARS  10/10/2023    IR NONTUNNELED VASCULAR CATHETER > 5 YEARS 10/10/2023 [facility] CARDIAC CATH/EP/IR LAB    OTHER SURGICAL HISTORY Left     Rentia attachment    TUBAL LIGATION Bilateral     UPPER GASTROINTESTINAL ENDOSCOPY N/A 01/17/2023    ESOPHAGOGASTRODUODENOSCOPY performed by Rocco Christians, MD at [facility] ENDOSCOPY    UPPER GASTROINTESTINAL ENDOSCOPY N/A 01/17/2023    ESOPHAGOGASTRODUODENOSCOPY BIOPSY performed by Rocco Christians, MD at [facility] ENDOSCOPY    UPPER GASTROINTESTINAL ENDOSCOPY N/A 01/18/2023    ESOPHAGOGASTRODUODENOSCOPY performed by Rocco Christians, MD at [facility] ENDOSCOPY    UPPER GASTROINTESTINAL ENDOSCOPY N/A 05/13/2023    ESOPHAGOGASTRODUODENOSCOPY performed by Bynum Cassis, MD at [facility] ENDOSCOPY    UPPER GASTROINTESTINAL ENDOSCOPY N/A 05/13/2023    ESOPHAGOGASTRODUODENOSCOPY BIOPSY performed by Bynum Cassis, MD at [facility] ENDOSCOPY    UPPER GASTROINTESTINAL ENDOSCOPY N/A 09/21/2023    ESOPHAGOGASTRODUODENOSCOPY performed by Rocco Christians, MD at [facility] ENDOSCOPY    UPPER GASTROINTESTINAL ENDOSCOPY N/A 09/21/2023    ESOPHAGOGASTRODUODENOSCOPY BIOPSY performed by Rocco Christians, MD at [facility] ENDOSCOPY    US  ABSCESS DRAINAGE PERITONEAL/RETROPERITONEAL [facility]  02/11/2023    US  ABSCESS DRAINAGE PERITONEAL 02/11/2023 [facility] US      Social History     Tobacco Use    Smoking status: Never    Smokeless tobacco: Never   Substance Use Topics    Alcohol use: Never      No family history on file.     No Known Allergies     Prior to Admission medications    Medication Sig Start Date End Date Taking? Authorizing Provider   sodium bicarbonate  650 MG tablet Take 1 tablet by mouth 3 times daily 02/23/24   Muncy, Terra Ferns, MD   ondansetron   (ZOFRAN -ODT) 4 MG disintegrating tablet Take 1 tablet by mouth every 8 hours as needed for Nausea or Vomiting 02/23/24   Andrea Kay, MD   metoclopramide  (REGLAN ) 5 MG tablet Take 1 tablet by mouth 3 times daily (before meals) 02/23/24   Muncy, Terra Ferns, MD   pantoprazole  (PROTONIX ) 40 MG tablet Take 1 tablet by mouth 2 times daily (before meals) 02/23/24   Muncy, Terra Ferns, MD   sennosides-docusate sodium  (SENOKOT-S) 8.6-50 MG tablet Take 1 tablet by mouth in the morning and at bedtime 11/06/23   Do, Khoi B, MD   ondansetron  (ZOFRAN -ODT) 4 MG disintegrating tablet Take 1  tablet by mouth every 8 hours as needed for Nausea or Vomiting 11/06/23   Do, Shiela Dooms, MD   prochlorperazine  (COMPAZINE ) 10 MG tablet Take 1 tablet by mouth every 6 hours as needed (nausea or vomiting) 10/25/23   Aktig, Ziya, MD   metFORMIN  (GLUCOPHAGE -XR) 500 MG extended release tablet Take 2 tablets by mouth daily (with breakfast) 10/25/23   Aktig, Ziya, MD   busPIRone  (BUSPAR ) 15 MG tablet Take 15 mg by mouth 3 times daily 10/14/23   Tefera, Mesfin A, MD   DULoxetine  (CYMBALTA ) 60 MG extended release capsule Take 1 capsule by mouth daily 10/15/23   Tefera, Mesfin A, MD   clopidogrel  (PLAVIX ) 75 MG tablet Take 1 tablet by mouth daily 10/15/23   Tefera, Mesfin A, MD   aspirin  81 MG chewable tablet Take 1 tablet by mouth daily 10/15/23   Tefera, Mesfin A, MD   linaclotide  (LINZESS ) 290 MCG CAPS capsule Take 1 capsule by mouth every morning (before breakfast) 09/23/23   Muncy, Terra Ferns, MD   amitriptyline  (ELAVIL ) 10 MG tablet Take 1 tablet by mouth nightly 09/14/23 12/13/23  Lyell, Elmira Haddock, MD   polyethylene glycol (GLYCOLAX ) 17 GM/SCOOP powder Take 17 g by mouth 2 times daily 09/14/23 03/12/24  Lyell, Elmira Haddock, MD   dicyclomine  (BENTYL ) 20 MG tablet Take 1 tablet by mouth every 6 hours as needed (abdominal cramping) 09/14/23   Lyell, Elmira Haddock, MD   metoprolol  succinate (TOPROL  XL) 50 MG extended release tablet Take 1 tablet by mouth daily  03/23/23   Do, Khoi B, MD   amLODIPine  (NORVASC ) 10 MG tablet Take 1 tablet by mouth daily 03/23/23   Do, Shiela Dooms, MD       REVIEW OF SYSTEMS:  See HPI for details   POSITIVE= Bold.  Negative = normal text  General:  fever, chills, sweats, generalized weakness, weight loss/gain, loss of appetite  Eyes:  blurred vision, eye pain, loss of vision, diplopia  Ear Nose and Throat:  rhinorrhea, pharyngitis  Respiratory:   cough, sputum production, SOB, wheezing, DOE, pleuritic pain  Cardiology:  chest pain, palpitations, orthopnea, PND, edema, syncope   Gastrointestinal:  abdominal pain, N/V, dysphagia, diarrhea, constipation, bleeding  Genitourinary:  frequency, urgency, dysuria, hematuria, incontinence  Muskuloskeletal :  arthralgia, myalgia  Hematology:  easy bruising, bleeding, lymphadenopathy  Dermatological:  rash, ulceration, pruritis  Endocrine:  hot flashes or polydipsia  Neurological:  headache, dizziness, confusion, focal weakness, paresthesia, memory loss, gait disturbance  Psychological: anxiety, depression, agitation      Objective:   VITALS:    Vitals:    03/06/24 2305   BP: 135/80   Pulse: 90   Resp:    Temp:    SpO2: 98%     Patient Vitals for the past 24 hrs:   BP Temp Temp src Pulse Resp SpO2 Height Weight   03/06/24 2305 135/80 -- -- 90 -- 98 % -- --   03/06/24 2300 128/80 -- -- -- -- -- -- --   03/06/24 2255 138/77 -- -- 91 -- 96 % -- --   03/06/24 2245 123/88 -- -- 95 11 98 % -- --   03/06/24 2244 -- -- -- 94 12 98 % -- --   03/06/24 2240 132/88 -- -- 96 -- 97 % -- --   03/06/24 2239 -- -- -- 95 13 96 % -- --   03/06/24 2220 138/80 -- -- 95 (!) 8 98 % -- --   03/06/24 2210 (!) 142/92 -- --  99 -- 98 % -- --   03/06/24 2155 (!) 143/92 -- -- 99 -- 95 % -- --   03/06/24 2150 (!) 160/101 -- -- (!) 105 24 100 % -- --   03/06/24 2145 (!) 150/93 -- -- 96 16 95 % -- --   03/06/24 2135 (!) 143/87 -- -- 96 13 96 % -- --   03/06/24 2115 136/86 -- -- (!) 101 18 95 % -- --   03/06/24 2100 (!) 143/90 -- -- (!) 103 18  94 % -- --   03/06/24 2045 (!) 161/92 -- -- (!) 108 14 91 % -- --   03/06/24 2035 (!) 148/93 -- -- (!) 112 16 98 % -- --   03/06/24 2025 (!) 146/88 -- -- (!) 110 16 100 % -- --   03/06/24 2021 -- -- -- -- 20 -- -- --   03/06/24 2020 (!) 163/88 -- -- (!) 112 -- 97 % -- --   03/06/24 2010 133/86 -- -- (!) 108 18 97 % -- --   03/06/24 2000 (!) 148/90 -- -- (!) 102 12 94 % -- --   03/06/24 1955 (!) 162/96 -- -- (!) 101 11 98 % -- --   03/06/24 1950 (!) 161/97 -- -- 98 14 -- -- --   03/06/24 1945 (!) 162/95 -- -- (!) 101 14 91 % -- --   03/06/24 1940 (!) 168/113 -- -- (!) 102 -- 97 % -- --   03/06/24 1935 (!) 190/113 -- -- (!) 103 -- 98 % -- --   03/06/24 1915 (!) 216/130 -- -- (!) 104 16 96 % -- --   03/06/24 1900 (!) 218/125 -- -- (!) 105 14 97 % -- --   03/06/24 1830 (!) 215/122 -- -- (!) 105 11 95 % -- --   03/06/24 1800 (!) 240/163 -- -- (!) 116 18 100 % -- --   03/06/24 1700 (!) 210/119 -- -- (!) 104 15 100 % -- --   03/06/24 1630 (!) 244/149 -- -- (!) 121 15 100 % -- --   03/06/24 1619 (!) 198/100 98.2 F (36.8 C) Oral (!) 117 18 100 % 1.499 m (4\' 11" ) 57.6 kg (127 lb)       Temp (24hrs), Avg:98.2 F (36.8 C), Min:98.2 F (36.8 C), Max:98.2 F (36.8 C)           Wt Readings from Last 12 Encounters:   03/06/24 57.6 kg (127 lb)   02/21/24 58 kg (127 lb 13.9 oz)   11/28/23 52.2 kg (115 lb 1.3 oz)   10/31/23 52.6 kg (115 lb 15.4 oz)   10/21/23 55.2 kg (121 lb 11.1 oz)   10/12/23 60.4 kg (133 lb 3.2 oz)   10/03/23 56.7 kg (125 lb)   09/22/23 56.7 kg (125 lb)   09/06/23 56.7 kg (125 lb)   08/31/23 56.7 kg (125 lb)   07/19/23 59 kg (130 lb)   06/17/23 59 kg (130 lb)     Body mass index is 25.65 kg/m.    PHYSICAL EXAM:    Gen: Petite Hispanic female, in moderate distress, heaving continuously and spitting up small amount of clear liquid, daughter Cathyann Cobia bedside, Well-developed, well-nourished  HEENT:  Pink conjunctivae, PERRL, hearing intact to voice, moist mucous membranes  Neck: Supple, without masses, thyroid  non-tender  Resp: No accessory muscle use, clear breath sounds without wheezes rales or rhonchi  Card: Tachycardic, No murmurs, normal S1, S2 without thrills, bruits or peripheral edema  Abd:  Soft, non-tender, non-distended, normoactive bowel sounds are present, no palpable organomegaly and no detectable hernias  Lymph:  No cervical or inguinal adenopathy  Musc: No cyanosis or clubbing  Skin: No rashes or ulcers, skin turgor is good  Neuro:  Cranial nerves are grossly intact, no focal motor weakness, follows commands appropriately  Psych:  Good insight, oriented to person, place and time, alert          _______________________________________________________________________  Care Plan discussed with:    Comments   Patient y Discussed with patient in room. POC outlined and Questions answered    Family  y Daughter Metallurgist x    Care Manager                    Consultant:  x ED MD   _______________________________________________________________________  Recommended Disposition:   Home with Family y   HH/PT/OT/RN    SNF/LTC    SA[facility]    ________________________________________________________________________  TOTAL TIME:  60 Minutes critical care, excluding procedure        Comments   >50% of visit spent in counseling and coordination of care  Chart reviewed  Discussion with patient and/or family and questions answered     LAB DATA REVIEWED:    Recent Results (from the past 12 hours)   CBC with Auto Differential    Collection Time: 03/06/24  5:13 PM   Result Value Ref Range    WBC 7.4 3.6 - 11.0 K/uL    RBC 3.33 (L) 3.80 - 5.20 M/uL    Hemoglobin 9.5 (L) 11.5 - 16.0 g/dL    Hematocrit 82.9 (L) 35.0 - 47.0 %    MCV 83.5 80.0 - 99.0 FL    MCH 28.5 26.0 - 34.0 PG    MCHC 34.2 30.0 - 36.5 g/dL    RDW 56.2 (H) 13.0 - 14.5 %    Platelets 454 (H) 150 - 400 K/uL    MPV 8.9 8.9 - 12.9 FL    Nucleated RBCs 0.0 0 PER 100 WBC    nRBC 0.00 0.00 - 0.01 K/uL    Neutrophils % 67.9 32.0 - 75.0 %    Lymphocytes % 17.4 12.0 - 49.0 %     Monocytes % 7.7 5.0 - 13.0 %    Eosinophils % 5.8 0.0 - 7.0 %    Basophils % 0.9 0.0 - 1.0 %    Immature Granulocytes % 0.3 0.0 - 0.5 %    Neutrophils Absolute 5.05 1.80 - 8.00 K/UL    Lymphocytes Absolute 1.29 0.80 - 3.50 K/UL    Monocytes Absolute 0.57 0.00 - 1.00 K/UL    Eosinophils Absolute 0.43 (H) 0.00 - 0.40 K/UL    Basophils Absolute 0.07 0.00 - 0.10 K/UL    Immature Granulocytes Absolute 0.02 0.00 - 0.04 K/UL    Differential Type AUTOMATED     Comprehensive Metabolic Panel    Collection Time: 03/06/24  5:13 PM   Result Value Ref Range    Sodium 141 136 - 145 mmol/L    Potassium 4.3 3.5 - 5.1 mmol/L    Chloride 112 (H) 97 - 108 mmol/L    CO2 22 21 - 32 mmol/L    Anion Gap 7 2 - 12 mmol/L    Glucose 169 (H) 65 - 100 mg/dL    BUN 61 (H) 6 - 20 MG/DL    Creatinine 8.65 (H) 0.55 - 1.02 MG/DL    BUN/Creatinine Ratio 17 12 -  20      Est, Glom Filt Rate 15 (L) >60 ml/min/1.14m2    Calcium  8.6 8.5 - 10.1 MG/DL    Total Bilirubin 0.4 0.2 - 1.0 MG/DL    ALT 43 12 - 78 U/L    AST 45 (H) 15 - 37 U/L    Alk Phosphatase 192 (H) 45 - 117 U/L    Total Protein 6.2 (L) 6.4 - 8.2 g/dL    Albumin  2.2 (L) 3.5 - 5.0 g/dL    Globulin 4.0 2.0 - 4.0 g/dL    Albumin /Globulin Ratio 0.6 (L) 1.1 - 2.2     Lipase    Collection Time: 03/06/24  5:13 PM   Result Value Ref Range    Lipase 33 13 - 75 U/L   Extra Tubes Hold    Collection Time: 03/06/24  5:13 PM   Result Value Ref Range    Specimen HOld SST,RED     Comment:        Add-on orders for these samples will be processed based on acceptable specimen integrity and analyte stability, which may vary by analyte.   Urinalysis with Reflex to Culture    Collection Time: 03/06/24  8:20 PM    Specimen: Urine   Result Value Ref Range    Color, UA YELLOW/STRAW      Appearance CLEAR CLEAR      Specific Gravity, UA 1.014 1.003 - 1.030      pH, Urine 6.5 5.0 - 8.0      Protein, UA 300 (A) NEG mg/dL    Glucose, Ur 782 (A) NEG mg/dL    Ketones, Urine Negative NEG mg/dL    Bilirubin, Urine Negative NEG       Blood, Urine SMALL (A) NEG      Urobilinogen, Urine 0.2 0.2 - 1.0 EU/dL    Nitrite, Urine Negative NEG      Leukocyte Esterase, Urine Negative NEG      Urine Culture if Indicated CULTURE NOT INDICATED BY UA RESULT CNI      WBC, UA 0-4 0 - 4 /hpf    RBC, UA 0-5 0 - 5 /hpf    Epithelial Cells, UA MODERATE (A) FEW /lpf    BACTERIA, URINE Negative NEG /hpf    Hyaline Casts, UA 0-2 0 - 2 /lpf         CT Result (most recent):  CT ABDOMEN PELVIS WO CONTRAST 03/06/2024    Narrative  CLINICAL HISTORY: bilateral flank pain, vomiting  INDICATION: bilateral flank pain, vomiting  COMPARISON: 02/17/2024  CONTRAST:  None.    TECHNIQUE:  Thin axial images were obtained through the abdomen and pelvis. Coronal and  sagittal reformats were generated. Oral contrast was not administered. CT dose  reduction was achieved through use of a standardized protocol tailored for this  examination and automatic exposure control for dose modulation.    The absence of intravenous contrast material significantly reduces the  sensitivity for evaluation of visceral organs and vasculature including presence  of small mass lesions, hemodynamically significant stenosis, dissections,  mucosal abnormalities etc.    FINDINGS:  LOWER THORAX: Cardiomegaly. Minimal pericardial effusion. Moderate right and  small left-sided pleural effusion.  LIVER/GALLBLADDER: No mass.  CBD is not dilated.  SPLEEN/PANCREAS:  No mass lesion or splenomegaly  ADRENALS/KIDNEYS: Unremarkable. No obstructive renal calculus or hydronephrosis.  STOMACH: Unremarkable.  SMALL BOWEL/COLON: Fecal stasis.  PERITONEUM: No ascites or pneumoperitoneum.  RETROPERITONEUM: No lymphadenopathy or aortic aneurysm.  APPENDIX: Unremarkable.  BLADDER/REPRODUCTIVE ORGANS: No  acute process.  BONES:  No acute fracture or dislocation.  ADDITIONAL COMMENTS: N/A    Impression  Cardiomegaly, moderate right and small left pleural effusion with pericardial  effusion as well.    No acute intraperitoneal  process.  Incidental and/or nonemergent findings are as described in detail above.      Electronically signed by Lella Putt Result (most recent):  XR CHEST PORTABLE 02/17/2024    Narrative  EXAM: XR CHEST PORTABLE  ACC#: [id number]    INDICATION: SOB, []     COMPARISON: Chest radiograph October 10, 2023    FINDINGS: AP portable chest radiograph. The lungs are adequately expanded. The  heart size is within normal limits. There is no focal airspace consolidation.  The vascular clarity is within normal limits. There is no evidence of pleural  effusion or pneumothorax. No displaced fracture is seen.    Impression  No acute cardiopulmonary findings.          Electronically signed by Vina Greaves      ________________________________________________________________________  Signed: Jerrlyn Morel, MD        Procedures: see electronic medical records for all procedures/Xrays/labs and details which were not copied into this note but were reviewed prior to creation of Plan.

## 2024-03-07 LAB — POCT GLUCOSE
POC Glucose: 155 mg/dL — ABNORMAL HIGH (ref 65–117)
POC Glucose: 170 mg/dL — ABNORMAL HIGH (ref 65–117)
POC Glucose: 174 mg/dL — ABNORMAL HIGH (ref 65–117)

## 2024-03-07 LAB — BASIC METABOLIC PANEL
Anion Gap: 6 mmol/L (ref 2–12)
BUN/Creatinine Ratio: 17 (ref 12–20)
BUN: 56 mg/dL — ABNORMAL HIGH (ref 6–20)
CO2: 21 mmol/L (ref 21–32)
Calcium: 7.9 mg/dL — ABNORMAL LOW (ref 8.5–10.1)
Chloride: 112 mmol/L — ABNORMAL HIGH (ref 97–108)
Creatinine: 3.36 mg/dL — ABNORMAL HIGH (ref 0.55–1.02)
Est, Glom Filt Rate: 17 mL/min/{1.73_m2} — ABNORMAL LOW (ref >60)
Glucose: 170 mg/dL — ABNORMAL HIGH (ref 65–100)
Potassium: 3.8 mmol/L (ref 3.5–5.1)
Sodium: 139 mmol/L (ref 136–145)

## 2024-03-07 LAB — URINALYSIS WITH REFLEX TO CULTURE
BACTERIA, URINE: NEGATIVE /HPF
Bilirubin, Urine: NEGATIVE
Glucose, Ur: 250 mg/dL — AB
Ketones, Urine: NEGATIVE mg/dL
Leukocyte Esterase, Urine: NEGATIVE
Nitrite, Urine: NEGATIVE
Protein, UA: 300 mg/dL — AB
Specific Gravity, UA: 1.014 (ref 1.003–1.030)
Urobilinogen, Urine: 0.2 EU/dL (ref 0.2–1.0)
pH, Urine: 6.5 (ref 5.0–8.0)

## 2024-03-07 LAB — CBC WITH AUTO DIFFERENTIAL
Basophils %: 0.9 % (ref 0.0–1.0)
Basophils Absolute: 0.06 10*3/uL (ref 0.00–0.10)
Eosinophils %: 8.9 % — ABNORMAL HIGH (ref 0.0–7.0)
Eosinophils Absolute: 0.61 10*3/uL — ABNORMAL HIGH (ref 0.00–0.40)
Hematocrit: 25 % — ABNORMAL LOW (ref 35.0–47.0)
Hemoglobin: 8.2 g/dL — ABNORMAL LOW (ref 11.5–16.0)
Immature Granulocytes %: 0.3 % (ref 0.0–0.5)
Immature Granulocytes Absolute: 0.02 10*3/uL (ref 0.00–0.04)
Lymphocytes %: 24.3 % (ref 12.0–49.0)
Lymphocytes Absolute: 1.67 10*3/uL (ref 0.80–3.50)
MCH: 28.2 pg (ref 26.0–34.0)
MCHC: 32.8 g/dL (ref 30.0–36.5)
MCV: 85.9 FL (ref 80.0–99.0)
MPV: 8.5 FL — ABNORMAL LOW (ref 8.9–12.9)
Monocytes %: 8 % (ref 5.0–13.0)
Monocytes Absolute: 0.55 10*3/uL (ref 0.00–1.00)
Neutrophils %: 57.6 % (ref 32.0–75.0)
Neutrophils Absolute: 3.96 10*3/uL (ref 1.80–8.00)
Nucleated RBCs: 0 /100{WBCs}
Platelets: 357 10*3/uL (ref 150–400)
RBC: 2.91 M/uL — ABNORMAL LOW (ref 3.80–5.20)
RDW: 15.6 % — ABNORMAL HIGH (ref 11.5–14.5)
WBC: 6.9 10*3/uL (ref 3.6–11.0)
nRBC: 0 10*3/uL (ref 0.00–0.01)

## 2024-03-07 LAB — URINE DRUG SCREEN
Amphetamine, Urine: NEGATIVE
Barbiturates, Urine: NEGATIVE
Benzodiazepines, Urine: NEGATIVE
Cocaine, Urine: NEGATIVE
Methadone, Urine: NEGATIVE
Opiates, Urine: POSITIVE — AB
Phencyclidine, Urine: NEGATIVE
THC, TH-Cannabinol, Urine: NEGATIVE

## 2024-03-07 LAB — BRAIN NATRIURETIC PEPTIDE: NT Pro-BNP: >35000 pg/mL — ABNORMAL HIGH (ref <125)

## 2024-03-07 MED ORDER — SODIUM CHLORIDE (PF) 0.9 % IJ SOLN
0.9 | Freq: Every day | INTRAMUSCULAR | Status: DC
Start: 2024-03-07 — End: 2024-03-06

## 2024-03-07 MED ORDER — CLONIDINE 0.3 MG/24HR TD PTWK
0.3 | TRANSDERMAL | Status: DC
Start: 2024-03-07 — End: 2024-03-07

## 2024-03-07 MED ORDER — METOCLOPRAMIDE HCL 5 MG/ML IJ SOLN
5 | Freq: Four times a day (QID) | INTRAMUSCULAR | Status: DC
Start: 2024-03-07 — End: 2024-03-07

## 2024-03-07 MED ORDER — DICYCLOMINE HCL 10 MG/ML IM SOLN
10 | Freq: Four times a day (QID) | INTRAMUSCULAR | Status: DC | PRN
Start: 2024-03-07 — End: 2024-03-07
  Administered 2024-03-07: 09:00:00 10 mg via INTRAMUSCULAR

## 2024-03-07 MED ORDER — SODIUM CHLORIDE 0.9 % IV SOLN
0.9 | INTRAVENOUS | Status: DC | PRN
Start: 2024-03-07 — End: 2024-03-07

## 2024-03-07 MED ORDER — NIFEDIPINE ER OSMOTIC RELEASE 30 MG PO TB24
30 | Freq: Once | ORAL | Status: AC
Start: 2024-03-07 — End: 2024-03-07
  Administered 2024-03-07: 08:00:00 30 mg via ORAL

## 2024-03-07 MED ORDER — METOPROLOL SUCCINATE ER 50 MG PO TB24
50 | Freq: Every day | ORAL | Status: DC
Start: 2024-03-07 — End: 2024-03-07
  Administered 2024-03-07 (×2): 50 mg via ORAL

## 2024-03-07 MED ORDER — HYDROMORPHONE 0.5MG/0.5ML IJ SOLN
1 | Freq: Once | Status: AC
Start: 2024-03-07 — End: 2024-03-06
  Administered 2024-03-07: 04:00:00 0.25 mg via INTRAVENOUS

## 2024-03-07 MED ORDER — POLYETHYLENE GLYCOL 3350 17 G PO PACK
17 | Freq: Every day | ORAL | Status: DC | PRN
Start: 2024-03-07 — End: 2024-03-07

## 2024-03-07 MED ORDER — METOCLOPRAMIDE HCL 5 MG/ML IJ SOLN
5 | Freq: Four times a day (QID) | INTRAMUSCULAR | Status: DC
Start: 2024-03-07 — End: 2024-03-07
  Administered 2024-03-07 (×2): 5 mg via INTRAVENOUS

## 2024-03-07 MED ORDER — NORMAL SALINE FLUSH 0.9 % IV SOLN
0.9 | Freq: Two times a day (BID) | INTRAVENOUS | Status: DC
Start: 2024-03-07 — End: 2024-03-07
  Administered 2024-03-07: 15:00:00 10 mL via INTRAVENOUS

## 2024-03-07 MED ORDER — ACETAMINOPHEN 650 MG RE SUPP
650 | Freq: Four times a day (QID) | RECTAL | Status: DC | PRN
Start: 2024-03-07 — End: 2024-03-07

## 2024-03-07 MED ORDER — SODIUM CHLORIDE 0.9 % IV SOLN
0.9 | INTRAVENOUS | Status: DC
Start: 2024-03-07 — End: 2024-03-07
  Administered 2024-03-07: 04:00:00 via INTRAVENOUS

## 2024-03-07 MED ORDER — CLOPIDOGREL BISULFATE 75 MG PO TABS
75 | Freq: Every day | ORAL | Status: DC
Start: 2024-03-07 — End: 2024-03-07
  Administered 2024-03-07: 15:00:00 75 mg via ORAL

## 2024-03-07 MED ORDER — DOCUSATE SODIUM 50 MG/5ML PO LIQD
50 | Freq: Every day | ORAL | Status: DC
Start: 2024-03-07 — End: 2024-03-07
  Administered 2024-03-07: 15:00:00 100 mg via ORAL

## 2024-03-07 MED ORDER — HYDRALAZINE HCL 20 MG/ML IJ SOLN
20 | Freq: Four times a day (QID) | INTRAMUSCULAR | Status: DC | PRN
Start: 2024-03-07 — End: 2024-03-07

## 2024-03-07 MED ORDER — AMLODIPINE BESYLATE 5 MG PO TABS
5 | Freq: Every day | ORAL | Status: DC
Start: 2024-03-07 — End: 2024-03-07

## 2024-03-07 MED ORDER — NICARDIPINE HCL 2.5 MG/ML IV SOLN
2.5 | INTRAVENOUS | Status: DC
Start: 2024-03-07 — End: 2024-03-07
  Administered 2024-03-07: 04:00:00 2 mg/h via INTRAVENOUS

## 2024-03-07 MED ORDER — LABETALOL HCL 5 MG/ML IV SOLN
5 | Freq: Four times a day (QID) | INTRAVENOUS | Status: DC | PRN
Start: 2024-03-07 — End: 2024-03-07

## 2024-03-07 MED ORDER — ASPIRIN 300 MG RE SUPP
300 | Freq: Every day | RECTAL | Status: DC
Start: 2024-03-07 — End: 2024-03-07
  Administered 2024-03-07 (×2): 300 mg via RECTAL

## 2024-03-07 MED ORDER — ONDANSETRON 4 MG PO TBDP
4 | Freq: Three times a day (TID) | ORAL | Status: DC | PRN
Start: 2024-03-07 — End: 2024-03-07

## 2024-03-07 MED ORDER — CLONIDINE 0.1 MG/24HR TD PTWK
0.1 | TRANSDERMAL | Status: DC
Start: 2024-03-07 — End: 2024-03-07
  Administered 2024-03-07: 15:00:00 1 via TRANSDERMAL

## 2024-03-07 MED ORDER — METOCLOPRAMIDE HCL 5 MG/ML IJ SOLN
5 | Freq: Four times a day (QID) | INTRAMUSCULAR | Status: DC
Start: 2024-03-07 — End: 2024-03-07
  Administered 2024-03-07: 15:00:00 5 mg via INTRAVENOUS

## 2024-03-07 MED ORDER — NORMAL SALINE FLUSH 0.9 % IV SOLN
0.9 | INTRAVENOUS | Status: DC | PRN
Start: 2024-03-07 — End: 2024-03-07

## 2024-03-07 MED ORDER — INSULIN LISPRO 100 UNIT/ML IJ SOLN
100 | Freq: Four times a day (QID) | INTRAMUSCULAR | Status: DC
Start: 2024-03-07 — End: 2024-03-07

## 2024-03-07 MED ORDER — PROCHLORPERAZINE EDISYLATE 10 MG/2ML IJ SOLN
10 | Freq: Four times a day (QID) | INTRAMUSCULAR | Status: DC | PRN
Start: 2024-03-07 — End: 2024-03-07

## 2024-03-07 MED ORDER — ACETAMINOPHEN 325 MG PO TABS
325 | Freq: Four times a day (QID) | ORAL | Status: DC | PRN
Start: 2024-03-07 — End: 2024-03-07

## 2024-03-07 MED ORDER — ONDANSETRON HCL 4 MG/2ML IJ SOLN
4 | Freq: Four times a day (QID) | INTRAMUSCULAR | Status: DC | PRN
Start: 2024-03-07 — End: 2024-03-07
  Administered 2024-03-07: 03:00:00 4 mg via INTRAVENOUS

## 2024-03-07 MED ORDER — ENOXAPARIN SODIUM 30 MG/0.3ML IJ SOSY
30 | Freq: Every day | INTRAMUSCULAR | Status: DC
Start: 2024-03-07 — End: 2024-03-07
  Administered 2024-03-07: 15:00:00 30 mg via SUBCUTANEOUS

## 2024-03-07 MED ORDER — PANTOPRAZOLE SODIUM 40 MG IV SOLR
40 | Freq: Two times a day (BID) | INTRAVENOUS | Status: DC
Start: 2024-03-07 — End: 2024-03-07
  Administered 2024-03-07 (×2): 40 mg via INTRAVENOUS

## 2024-03-07 MED FILL — METOPROLOL SUCCINATE ER 50 MG PO TB24: 50 MG | ORAL | Qty: 1 | Fill #0

## 2024-03-07 MED FILL — ASPIRIN 300 MG RE SUPP: 300 MG | RECTAL | Qty: 1 | Fill #0

## 2024-03-07 MED FILL — CLONIDINE 0.1 MG/24HR TD PTWK: 0.1 MG/24HR | TRANSDERMAL | Qty: 1 | Fill #0

## 2024-03-07 MED FILL — SODIUM CHLORIDE 0.9 % IV SOLN: 0.9 % | INTRAVENOUS | Qty: 250 | Fill #0

## 2024-03-07 MED FILL — METOPROLOL SUCCINATE ER 25 MG PO TB24: 25 MG | ORAL | Qty: 2 | Fill #0

## 2024-03-07 MED FILL — DICYCLOMINE HCL 10 MG/ML IM SOLN: 10 MG/ML | INTRAMUSCULAR | Qty: 1 | Fill #0

## 2024-03-07 MED FILL — HYDROMORPHONE HCL 1 MG/ML IJ SOLN: 1 MG/ML | INTRAMUSCULAR | Qty: 0.5 | Fill #0

## 2024-03-07 MED FILL — NIFEDIPINE ER OSMOTIC RELEASE 30 MG PO TB24: 30 MG | ORAL | Qty: 1 | Fill #0

## 2024-03-07 MED FILL — ENOXAPARIN SODIUM 30 MG/0.3ML IJ SOSY: 30 MG/0.3ML | INTRAMUSCULAR | Qty: 0.3 | Fill #0

## 2024-03-07 MED FILL — PANTOPRAZOLE SODIUM 40 MG IV SOLR: 40 MG | INTRAVENOUS | Qty: 40 | Fill #0

## 2024-03-07 MED FILL — BD POSIFLUSH 0.9 % IV SOLN: 0.9 % | INTRAVENOUS | Qty: 40 | Fill #0

## 2024-03-07 MED FILL — DOCUSATE SODIUM 50 MG/5ML PO LIQD: 50 MG/5ML | ORAL | Qty: 10 | Fill #0

## 2024-03-07 MED FILL — CLOPIDOGREL BISULFATE 75 MG PO TABS: 75 MG | ORAL | Qty: 1 | Fill #0

## 2024-03-07 MED FILL — METOCLOPRAMIDE HCL 5 MG/ML IJ SOLN: 5 MG/ML | INTRAMUSCULAR | Qty: 2 | Fill #0

## 2024-03-07 MED FILL — SODIUM CHLORIDE 0.9 % IV SOLN: 0.9 % | INTRAVENOUS | Qty: 1000 | Fill #0

## 2024-03-07 MED FILL — ONDANSETRON HCL 4 MG/2ML IJ SOLN: 4 MG/2ML | INTRAMUSCULAR | Qty: 2 | Fill #0

## 2024-03-07 NOTE — Discharge Summary (Signed)
 Physician Discharge Summary     Patient ID:  Melinda [name]  [id number]  43 y.o.  1981-05-24    Admit date: 03/06/2024    Discharge date and time: 03/07/2024    Admission Diagnoses: Abdominal pain [R10.9]  Flank pain [R10.9]  Hypertensive urgency [I16.0]  Nausea and vomiting, unspecified vomiting type [R11.2]    Discharge Diagnoses/Hospital Course   Ms. Melinda [name] is a 43 yo female with PMH of DM, CKD, HTN, PAD admitted with HTN emergency. She is s/p nicardipine  gtt. She also presented with a gastoparesis flare which resolved with IVF's, bowel rest and reglan . Her AKI/CKD is improving with IVF's. She was discharged home in improved condition with improved BP's and the ability to tolerate PO.     PCP: Adelfa Homes, APRN - NP     Consults: nephrology, GI     Discharge Exam:  Vitals:    03/07/24 0900 03/07/24 1000 03/07/24 1050 03/07/24 1200   BP: (!) 152/100 139/88     Pulse: 93 88  89   Resp: 13 10  18    Temp:    97.7 F (36.5 C)   TempSrc:    Oral   SpO2: 98% 100%  98%   Weight:       Height:   1.499 m (4' 11)       Gen: hispanic female. Well-developed, well-nourished  HEENT:  Pink conjunctivae, PERRL, hearing intact to voice, moist mucous membranes  Neck:  Supple, without masses, thyroid non-tender  Resp:  No accessory muscle use, clear breath sounds without wheezes rales or rhonchi  Card:   No murmurs, normal S1, S2 without thrills, bruits or peripheral edema  Abd:  Soft, non-tender, non-distended, normoactive bowel sounds are present, no palpable organomegaly and no detectable hernias  Lymph:  No cervical or inguinal adenopathy  Musc:  No cyanosis or clubbing  Skin:   No rashes or ulcers, skin turgor is good  Neuro:  Cranial nerves are grossly intact, no focal motor weakness, follows commands appropriately  Psych:  Good insight, oriented to person, place and time, alert     Disposition: home    Patient Instructions:   Discharge Medication List as of 03/07/2024  3:48 PM        CONTINUE these medications which  have NOT CHANGED    Details   sodium bicarbonate  650 MG tablet Take 1 tablet by mouth 3 times daily, Disp-90 tablet, R-0Normal      metoclopramide  (REGLAN ) 5 MG tablet Take 1 tablet by mouth 3 times daily (before meals), Disp-120 tablet, R-3Normal      pantoprazole  (PROTONIX ) 40 MG tablet Take 1 tablet by mouth 2 times daily (before meals), Disp-30 tablet, R-3Normal      sennosides-docusate sodium  (SENOKOT-S) 8.6-50 MG tablet Take 1 tablet by mouth in the morning and at bedtime, Disp-60 tablet, R-1Normal      ondansetron  (ZOFRAN -ODT) 4 MG disintegrating tablet Take 1 tablet by mouth every 8 hours as needed for Nausea or Vomiting, Disp-30 tablet, R-0Normal      prochlorperazine  (COMPAZINE ) 10 MG tablet Take 1 tablet by mouth every 6 hours as needed (nausea or vomiting), Disp-120 tablet, R-3Normal      metFORMIN  (GLUCOPHAGE -XR) 500 MG extended release tablet Take 2 tablets by mouth daily (with breakfast), Disp-90 tablet, R-1Normal      busPIRone  (BUSPAR ) 15 MG tablet Take 15 mg by mouth 3 times daily, Disp-30 tablet, R-0Normal      DULoxetine  (CYMBALTA ) 60 MG extended release capsule Take 1  capsule by mouth daily, Disp-30 capsule, R-3Normal      clopidogrel  (PLAVIX ) 75 MG tablet Take 1 tablet by mouth daily, Disp-30 tablet, R-3Normal      aspirin  81 MG chewable tablet Take 1 tablet by mouth daily, Disp-30 tablet, R-3Print      linaclotide  (LINZESS ) 290 MCG CAPS capsule Take 1 capsule by mouth every morning (before breakfast), Disp-30 capsule, R-0Normal      polyethylene glycol (GLYCOLAX ) 17 GM/SCOOP powder Take 17 g by mouth 2 times daily, Disp-1530 g, R-1NO PRINT      dicyclomine  (BENTYL ) 20 MG tablet Take 1 tablet by mouth every 6 hours as needed (abdominal cramping), Disp-90 tablet, R-0Normal      metoprolol  succinate (TOPROL  XL) 50 MG extended release tablet Take 1 tablet by mouth daily, Disp-30 tablet, R-2Normal      amLODIPine  (NORVASC ) 10 MG tablet Take 1 tablet by mouth daily, Disp-30 tablet, R-2Normal            STOP taking these medications       amitriptyline  (ELAVIL ) 10 MG tablet Comments:   Reason for Stopping:             Activity: activity as tolerated  Diet:  frequent small meals   Wound Care: none needed    Follow-up with Idowu, Funmilola, APRN - NP      Approximate time spent in patient care on day of discharge: 35 minutes     Signed:  Doria Garden, MD  03/21/2024  4:18 PM

## 2024-03-07 NOTE — Care Coordination-Inpatient (Signed)
 03/07/24 1105   Service Assessment   Patient Orientation Alert and Oriented   Cognition Alert   History Provided By Patient;Medical Record   Primary Caregiver Self   Support Systems Spouse/Significant Other;Children;Family Members   Patient's Healthcare Decision Maker is: Legal Next of Kin  (NOK)   PCP Verified by CM   ([facility])   Last Visit to PCP Within last 6 months   Prior Functional Level Independent in ADLs/IADLs   Current Functional Level Independent in ADLs/IADLs   Can patient return to prior living arrangement Yes   Ability to make needs known: Good   Family able to assist with home care needs: Yes   Financial Resources Medicaid   Community Resources None   CM/SW Referral Disease Management Education   Social/Functional History   Lives With Spouse   Type of Home House   Bathroom Shower/Tub None   Bathroom Equipment None   Home Equipment None   Receives Help From Family   Prior Level of Assist for ADLs Independent   Prior Level of Assist for [employer] Yes   Ambulation Assistance Independent   Prior Level of Assist for Transfers Independent   Occupation Full time employment   Type of Occupation self employed   Discharge Planning   Type of Residence House   Living Arrangements Spouse/Significant Other;Children;Family Members   Current Services Prior To Admission None   Potential Assistance Needed N/A   DME Ordered? No   Potential Assistance Purchasing Medications No   Type of Home Care Services None   Patient expects to be discharged to: [facility]         Care Management Initial Assessment  03/07/2024 11:09 AM  If patient is discharged prior to next notation, then this note serves as note for discharge by case management.    Reason for Admission:   Abdominal pain [R10.9]  Flank pain [R10.9]  Hypertensive urgency [I16.0]  Nausea and vomiting, unspecified vomiting type [R11.2]         Patient Admission Status: Inpatient  Date Admitted to INP: 36%  RUR:  Readmission Risk Score: 35.6    Hospitalization in the last 30 days (Readmission):  yes        Advance Care Planning:  Code Status: Full Code  Primary Healthcare Decision Maker: Legal Next of Kin [facility])  Primary Decision Maker: Darus Engels Child [phone]   Advance Directive: has NO advanced directive - not interested in additional information     __________________________________________________________________________  Assessment:   Readmitted with hypertensive emergency,gastroparesis and AKI    Recently discharged -was treated last hospitalization with severe gastroparesis    Comments: following pt for discharge needs     Discharge Concerns: [] Yes [x] No [] Unknown   Describe:    Financial concerns/barriers: [] Yes, explain: [x] No [] Unknown/Not discussed  __________________________________________________________________________    Insurer:   Active Insurance as of 03/06/2024       Primary Coverage       Payor Plan Insurance Group Employer/Plan Group    [place]        Payor Plan Address Payor Plan Phone Number Payor Plan Fax Number Effective Dates    [address] [address]   05/28/2023 - None [place] [address]         Subscriber Name Subscriber Birth Date Member ID       BIANCA, RANERI 03-Feb-1981 [id number]  PCP: Adelfa Homes, APRN - NP   Address: [address] / [place] [address]   Phone number: [phone]    Pharmacy:   [facility] [address] [place] - [address] [place] [phone] - F [phone]  [address]  [place] [address]  Phone: [phone] Fax: [phone]    DC Transport:  family       Transition of care plan:    [] Unable to determine at this time. Awaiting clinical progress, and disposition recommendations.    []  Home. No assistance required.     []  Home. Pt refused recommended services.    [x]  Home with family assistance as needed, and outpatient follow-up.    []  Home with Outpatient PT and outpatient follow-up   Pt  aware of OP appt? [] Yes, Provider:   [] Not scheduled   Transport provider:     []  Home with outpatient services.    Specify:    []  Home with Home Health   - Freedom of Choice offered? []  Yes, Preference:   []  NA    [] SNF/IPR   -[] Freedom of Choice offered, and preferences given:   [] Listing provided and preferences requested   -Status: [] Pending [] Accepted:    -Auth required: [] Yes [] No    -Auth initiated date:   -3 midnight stay required: [] Yes [] No  Date satisfied:     []  LTC:     []  Home with Hospice   - Freedom of Choice offered? []  Yes, Preference:   []  NA    []  Dispatch Health information provided.     []  Other:       Jentzen Minasyan S Ahilyn Nell,RN  Case Management Department  For questions or concerns, please PerfectServe

## 2024-03-07 NOTE — ED Notes (Signed)
 Pt ambulatory to restroom at this time.

## 2024-03-07 NOTE — Progress Notes (Signed)
 Comprehensive Nutrition Assessment    Type and Reason for Visit:  Initial, Positive nutrition screen    Nutrition Recommendations/Plan:   Continue NPO   Patient has required PPN in the past w/ gastroparesis flares - consider placement of DHT and small bowel feeding if unable to tolerate > 50% of meals in 3 - 5 days     Malnutrition Assessment:  Malnutrition Status:  Insufficient data (did not allow NFPE at this time) (03/07/24 1117)    Context:  Acute Illness     Findings of the 6 clinical characteristics of malnutrition:  Energy Intake:  Mild decrease in energy intake  Weight Loss:  Mild weight loss     Body Fat Loss:  Unable to assess     Muscle Mass Loss:  Unable to assess    Fluid Accumulation:  Unable to assess    Grip Strength:  Not Performed    Nutrition Assessment:     Patient is a 43 year old female admitted with Abdominal pain [R10.9]  Flank pain [R10.9]  Hypertensive urgency [I16.0]  Nausea and vomiting, unspecified vomiting type [R11.2]. 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  MST for unsure weight loss and poor PO intake. Familiar to services. NPO and c/o abd pain. Poor PO intake since ~2 days ago due to pain and nausea/vomiting. NKFA. No chewing/swallowing issues baseline. BG stable 170 @ last check. Very briefly required nicardene drip overnight but now off. UBW 125-130#; unclear methods of weights from 10/12/23 - 10/31/23, but if accurate patient had lost 18 #(13.5%) within 1 month, clinically significant for timeframe. NFPE declined today. Has tolerated Glucerna in the past and will order if diet advanced.     Wt Readings from Last 10 Encounters:   03/06/24 57.6 kg (127 lb)   02/21/24 58 kg (127 lb 13.9 oz)   11/28/23 52.2 kg (115 lb 1.3 oz)   10/31/23 52.6 kg (115 lb 15.4 oz)   10/21/23 55.2 kg (121 lb 11.1 oz)   10/12/23 60.4  kg (133 lb 3.2 oz)   10/03/23 56.7 kg (125 lb)   09/22/23 56.7 kg (125 lb)   09/06/23 56.7 kg (125 lb)   08/31/23 56.7 kg (125 lb)       Nutrition Related Findings:      Wound Type: None     Last BM:    Edema:                      Nutr. Labs:    Lab Results   Component Value Date    CREATININE 3.36 (H) 03/07/2024    BUN 56 (H) 03/07/2024    NA 139 03/07/2024    K 3.8 03/07/2024    CL 112 (H) 03/07/2024    CO2 21 03/07/2024       Lab Results   Component Value Date/Time    POCGLU 170 03/07/2024 04:51 AM    POCGLU 155 03/07/2024 12:09 AM    POCGLU 172 02/23/2024 11:37 AM    POCGLU 154 02/23/2024 08:24 AM    POCGLU 205 02/22/2024 08:30 PM    POCGLU 187 02/22/2024 05:17 PM        Hemoglobin A1C   Date Value Ref Range Status   02/19/2024 7.0 (H) 4.0 - 5.6 % Final     Comment:     (NOTE)  HbA1C Interpretive  Ranges  <5.7              Normal  5.7 - 6.4         Consider Prediabetes  >6.5              Consider Diabetes         Lab Results   Component Value Date/Time    MG 1.6 02/19/2024 05:02 AM       Lab Results   Component Value Date    CALCIUM  7.9 (L) 03/07/2024    PHOS 5.1 (H) 02/19/2024       Lab Results   Component Value Date    TRIG 155 (H) 10/12/2023    TRIG 453 (H) 09/21/2023    TRIG 346 (H) 09/01/2023     Lab Results   Component Value Date    ALT 43 03/06/2024    AST 45 (H) 03/06/2024    GGT 136 (H) 10/31/2023    ALKPHOS 192 (H) 03/06/2024    BILITOT 0.4 03/06/2024       Nutr. Meds:  Scheduled Meds:   docusate  100 mg Oral Daily    cloNIDine   1 patch TransDERmal Weekly    metoclopramide   5 mg IntraVENous Q6H    sodium chloride  flush  5-40 mL IntraVENous 2 times per day    enoxaparin   30 mg SubCUTAneous Daily    metoprolol  succinate  50 mg Oral Daily    [Held by provider] amLODIPine   10 mg Oral Daily    insulin  lispro  0-8 Units SubCUTAneous Q6H    aspirin   300 mg Rectal Daily    clopidogrel   75 mg Oral Daily    pantoprazole  (PROTONIX ) 40 mg in sodium chloride  (PF) 0.9 % 10 mL injection  40 mg IntraVENous Q12H      Continuous Infusions:   sodium chloride        PRN Meds: hydrALAZINE , sodium chloride  flush, sodium chloride , ondansetron  **OR** ondansetron , polyethylene glycol, acetaminophen  **OR** acetaminophen , labetalol , prochlorperazine , dicyclomine       Current Nutrition Intake & Therapies:    Average Meal Intake: NPO  Average Supplements Intake: NPO  Diet NPO Exceptions are: Ice Chips    Anthropometric Measures:  Height: 149.9 cm (4\' 11" )  Ideal Body Weight (IBW): 95 lbs (43 kg)       Current Body Weight: 57.6 kg (127 lb), 133.7 % IBW. Weight Source: Stated  Current BMI (kg/m2): 25.6  Usual Body Weight: 59 kg (130 lb)     % Weight Change (Calculated): -2.3  Weight Adjustment For: No Adjustment                 BMI Categories: Overweight (BMI 25.0-29.9)    Estimated Daily Nutrient Needs:  Energy Requirements Based On: Kcal/kg  Weight Used for Energy Requirements: Current  Energy (kcal/day): 1440 (25 kcal/kg)  Weight Used for Protein Requirements: Current  Protein (g/day): 46-58 (0.8-1.0 g/kg)  Method Used for Fluid Requirements: 1 ml/kcal  Fluid (ml/day): 1440    Nutrition Diagnosis:   Inadequate oral intake related to altered GI function as evidenced by GI abnormality, poor intake prior to admission    Nutrition Interventions:   Food and/or Nutrient Delivery: Continue NPO  Nutrition Education/Counseling: Education/Counseling needed  Coordination of Nutrition Care: Continue to monitor while inpatient  Plan of Care discussed with: nursing    Goals:  Goals: by next RD assessment, Initiation of nutrition          Nutrition Monitoring and Evaluation:   Behavioral-Environmental  Outcomes: None Identified  Food/Nutrient Intake Outcomes: Progression of Nutrition  Physical Signs/Symptoms Outcomes: Biochemical Data, Hemodynamic Status, GI Status, Weight    Discharge Planning:    Too soon to determine     Renell Carson, MS, RD, CNSC  Ext: [phone], or via PerfectServe

## 2024-03-07 NOTE — Discharge Instructions (Signed)
 HOSPITALIST DISCHARGE INSTRUCTIONS  NAME:  Melinda [name]   DOB:  12-14-1980   MRN:  [id number]     Date/Time:  03/07/2024 12:43 PM    ADMIT DATE: 03/06/2024     DISCHARGE DATE: 03/07/2024     DISCHARGE DIAGNOSIS:  Hypertensive urgency   Acute kidney injury   Gastroparesis     DISCHARGE INSTRUCTIONS:  Thank you for allowing us  to participate in your care. Your discharging Hospitalist is Doria Garden, MD. Elene Griffes were admitted for evaluation and treatment of the above.       MEDICATIONS:    It is important that you take the medication exactly as they are prescribed.   Keep your medication in the bottles provided by the pharmacist and keep a list of the medication names, dosages, and times to be taken in your wallet.   Do not take other medications without consulting your doctor.             If you experience any of the following symptoms then please call your primary care physician or return to the emergency room if you cannot get hold of your doctor:  Fever, chills, nausea, vomiting, diarrhea, change in mentation, falling, bleeding, shortness of breath    Follow Up:  Please call the below provider to arrange hospital follow up appointment      No follow-up provider specified.    For questions regarding your Hospitalization or to contact the Hospital Medicine team, please call [phone].      Information obtained by :  I understand that if any problems occur once I am at home I am to contact my physician.    I understand and acknowledge receipt of the instructions indicated above.                                                                                                                                           Physician's or R.N.'s Signature                                                                  Date/Time  Patient or Archivist

## 2024-03-07 NOTE — Care Coordination-Inpatient (Signed)
 03/07/24 1120   Readmission Assessment   Number of Days since last admission? 8-30 days   Previous Disposition Home with Family   Who is being Interviewed Patient   What was the patient's/caregiver's perception as to why they think they needed to return back to the hospital? Other (Comment)  (return of symptoms)   Did you visit your Primary Care Physician after you left the hospital, before you returned this time? No   Why weren't you able to visit your PCP? Did not have an appointment   Did you see a specialist, such as Cardiac, Pulmonary, Orthopedic Physician, etc. after you left the hospital? No   Who advised the patient to return to the hospital? Self-referral   Does the patient report anything that got in the way of taking their medications? No   In our efforts to provide the best possible care to you and others like you, can you think of anything that we could have done to help you after you left the hospital the first time, so that you might not have needed to return so soon? Teach back instructions regarding management of illness     Malikiah Debarr,RN

## 2024-03-07 NOTE — ED Notes (Signed)
 TRANSFER - OUT REPORT:    Verbal report given to Gracie,RN on Khara Renaud Toto-Ambros  being transferred to 464 for routine progression of patient care       Report consisted of patient's Situation, Background, Assessment and   Recommendations(SBAR).     Information from the following report(s) Nurse Handoff Report, ED Encounter Summary, ED SBAR, Adult Overview, MAR, Recent Results, and Cardiac Rhythm NSR  was reviewed with the receiving nurse.    Kinder Fall Assessment:    Presents to emergency department  because of falls (Syncope, seizure, or loss of consciousness): No  Age > 70: No  Altered Mental Status, Intoxication with alcohol or substance confusion (Disorientation, impaired judgment, poor safety awaremess, or inability to follow instructions): No  Impaired Mobility: Ambulates or transfers with assistive devices or assistance; Unable to ambulate or transer.: No  Nursing Judgement: No          Lines:   Peripheral IV 03/06/24 Left Forearm (Active)        Opportunity for questions and clarification was provided.      Patient transported with:  Monitor and Registered Nurse

## 2024-03-07 NOTE — Plan of Care (Signed)
 Problem: Chronic Conditions and Co-morbidities  Goal: Patient's chronic conditions and co-morbidity symptoms are monitored and maintained or improved  Outcome: Completed     Problem: Discharge Planning  Goal: Discharge to home or other facility with appropriate resources  Outcome: Completed  Flowsheets (Taken 03/07/2024 0345 by Arcola Beards, RN)  Discharge to home or other facility with appropriate resources: Identify barriers to discharge with patient and caregiver     Problem: Pain  Goal: Verbalizes/displays adequate comfort level or baseline comfort level  Outcome: Completed     Problem: Nutrition Deficit:  Goal: Optimize nutritional status  Outcome: Completed

## 2024-03-07 NOTE — Progress Notes (Signed)
 Physician Progress Note      PATIENT:               Melinda [name], Melinda [name]  CSN #:                  [id number]  DOB:                       1981/09/08  ADMIT DATE:       03/06/2024 4:22 PM  DISCH DATE:        03/07/2024 4:36 PM  RESPONDING  PROVIDER #:        Warden Ha MD          QUERY TEXT:    Acute Kidney Injury is documented in the medical record.  The patient is also known to have CKD 3.    Please provide additional clinical indicators supportive of your   documentation.  Or please document if the diagnosis of AKI has been ruled out after study.    The clinical indicators include:  -Per labs for this admission Creat @ 3.64- and @ 3.36 on discharge.  -Per labs GFR is 15-17  -Per H&P "...New baseline creatine seems to be 3.0"  Options provided:  -- Acute kidney injury evidenced by, Please document evidence as well as a   numerical baseline creatinine, if known.  -- Acute kidney injury ruled out after study. The patinet has CKD 3  -- Acute kidney injury ruled out after study. The CKD has worsened to CKD4.  -- Other - I will add my own diagnosis  -- Disagree - Not applicable / Not valid  -- Disagree - Clinically unable to determine / Unknown  -- Refer to Clinical Documentation Reviewer    PROVIDER RESPONSE TEXT:    Acute kidney injury was ruled out after study. The patient has CKD 3.    Query created by: Alanna Hu on 03/12/2024 9:18 AM      Electronically signed by:  Warden Ha MD 03/14/2024 12:23 PM

## 2024-03-11 NOTE — Consults (Signed)
 Nutrition Assessment  Available via secure chat or call RD office [phone] if offline    [facility] Tennis Fitch is a 43 y.o. female  Reason for visit: MD consult      Nutrition Interventions and/or Recommendations:  Carb 2 bland diet when able to advance  (bland is low fiber, low fat)    Malnutrition   Insufficient evidence    Hospital problem:  Intractable nausea and vomiting  42YO female with PMH including IDDM, gastroparesis, SMAS s/p stenting, HTN, HLD, CKD3,; who presented with n/v and abd pain. Reports 20lb weight loss in 1 year    Current status/nutrition evaluation:  Reviewed due to MD consult (Malnutrition)  Visited room - pt was on the phone; no effort to end the call.   Wt trends reviewed - 5% loss in 1 year; not significant.   Labs &meds reviewed.  Nutrition - on CLD; seems to be tolerating  NFPE - observed only but not c/f losses.   Pt is at  nutrition risk.     Anthropometrics   Height: 149.9 cm (4' 11)   Admit weight: 56.2 kg (124 lb) (03/09/24 2318)   Current weight:57.9 kg (127 lb 10.3 oz) (03/11/24 0710)   Body mass index is 25.78 kg/m. Overweight (BMI 25-29.9)  Desirable body weight: 95 lb (43.1 kg)       Usual body weight:        Wt Readings from Last 10 Encounters:   03/11/24 57.9 kg (127 lb 10.3 oz)   02/14/24 57.7 kg (127 lb 1.6 oz)   01/31/24 58.7 kg (129 lb 6.6 oz)   01/31/24 58.2 kg (128 lb 4.9 oz)   01/28/24 56.4 kg (124 lb 5.4 oz)   10/21/23  55.2kg (121lb)  09/22/23  56.7kg (125lb)  07/19/23  59kg  05/08/23  60kg  02/17/23  61.2kg      Food/Nutrition-Related History and Client history  Food Allergies/intolerances:  No Known Allergies  Pertinent Social history:  Cultural/Food Preferences: none noted      Social determinants of health food insecurity screening completed this admission?   Food Insecurity: No Food Insecurity (03/10/2024)    Hunger Vital Sign    . Worried About Programme researcher, broadcasting/film/video in the Last Year: Never true    . Ran Out of Food in the Last Year: Never true   Recent Concern:  Food Insecurity - Food Insecurity Present (01/31/2024)    Hunger Vital Sign    . Worried About Programme researcher, broadcasting/film/video in the Last Year: Often true    . Ran Out of Food in the Last Year: Often true       Current Nutrition:  Diet clear liquid  PO intake:    tolerating  Nutrition adequacy:   not adequate    Documented meal intake:  6/1 1-24% L - CLD  6/1 76-100% B- CLD      Biochemical Data, Medications and Procedures:  Pertinent Labs: reviewed    Recent Labs   Lab 03/09/24  2351 03/10/24  1440 03/11/24  0305   NA 132* 135 134*   K 4.3 4.1 3.5   CL 109* 108 105   CO2 14* 17* 20*   BUN 48* 44* 40*   CREATININE 3.7* 3.4* 3.5*   GFR 15 17 16    CALCIUM  8.5* 8.2* 7.9*   ALKPHOS 152*  --  106   ALT 37  --  25   AST 34  --  24   GLUCOSE 169*  163* 179*   ,   Recent Labs     03/11/24  0305   ALB 1.8*     Recent Labs     03/10/24  1715 03/10/24  2318 03/11/24  0504 03/11/24  1206   POCGLU 142* 132 149* 152*   ;   Lab Results   Component Value Date    HGBA1C 8.0 (H) 01/11/2024         Scheduled Meds:  . amLODIPine   10 mg Oral Daily   . aspirin   81 mg Oral Daily   . atorvastatin   20 mg Oral QHS   . bisoprolol  5 mg Oral Daily   . cefTRIAXone   1 g Intravenous Q24H   . clopidogreL   75 mg Oral Daily   . heparin  (porcine)  5,000 Units Subcutaneous Q8H SCH   . insulin  REGULAR (HumuLIN  R, NovoLIN  R) injection  0-4 Units Subcutaneous 4x Daily   . metoclopramide   10 mg Oral TID AC     Continuous Infusions:  PRN Meds:.acetaminophen , dextrose  50% in water , glucagon , HYDROmorphone , ondansetron , oxyCODONE , promethazine     Pertinent Procedures: see summary    Nutrition-Focused Physical Findings:  Overall Appearance:  (observed only - no overt losses)  Respiratory Status:     .  Last BM Date: 03/09/24.    GI symptoms:  (no nausea at this time)     Edema: trace nonpitting generalize    Net IO Since Admission: 1,285 mL [03/11/24 1547]    RN Head/Neck Assessment/HEENT  R Eye: Vision - Impaired (03/11/24 0929)  L Eye: Vision - Impaired (03/11/24  0929)  Lips: Dry (03/10/24 1934)  Voice: Deloras (03/11/24 9070)  Teeth: Missing teeth (03/11/24 0929)    Comparative standards   Based on   (58kg)  Energy: 1450-1740 (25-30kcal/kg)  Protein:  58-70g (1-1.2g/kg)  Fluid:  17ml/kcal or per MD      Nutrition Diagnosis:  -NI-2.1 Inadequate oral intake related to altered GI function/gastroparesis as evidenced by reported n/v PTA.     Goal: po intake >=75% of meals on average once on a solid diet    Nutrition Monitoring   po, labs, meds, wts  Follow per policy    Visit Type: Initial assessment  Nutritional Risk: At Risk  per RD clinical judgment    JON LELON PITTER MS RD LDN, MBA, MHA  [phone]

## 2024-03-19 NOTE — Progress Notes (Signed)
 Case Management Discharge Planning Note    Barriers identified?   None    EDD: 03/20/2024    Preferred DC location: Home    Medical Readiness for Discharge:   Reassessment Documentation  Goals prior to discharge: Per MDR's; Pt can likely DC today. Pt does not need HD now but will likely need it in 1-2 months. Pt is anticipated to need nocturnal O2 and RN will try to get a room air sat while napping today. RN documented the desat to 87 % on RA while napping at 2:22 today.  Preferred Discharge Location: Home    Team recommendations and collaboration for discharge planning  Home self care    Pt / Patient representative conversation and collaboration for discharge planning  CM spoke with daughter, Jonette [phone]), while she was on the way to the hospital this afternoon. She reports that she has not yet talked with Pt's spouse to get his income information. CM relayed plan per MDR's that Pt would possibly DC today. Daughter would like to talk with the MD when she arrives. CM reminded her that we will not be able to evaluate Pt for potential sponsorship eligibility without the income information and encouraged her to call the spouse this afternoon.     MD relayed plan to not DC Pt today due to the need for IV Dilaudid  today.     CM spoke with Rosaline at [employer] [phone]) who reports that the self pay price for nocturnal O2 is $172.27 monthly for a stationary concentrator.     CM Next Steps:  Follow-up with Pt/daughter, Jonette [phone]), to obtain household income to determine if we can assist with sponsorship.   Pt likely to need nocturnal O2 at DC.     Coordinated Resources:   TBD

## 2024-03-21 NOTE — Consults (Signed)
 MD consult for TF initiation. Pt with hx of gastroparesis. NGT in place. Will start pt on Isosource HN with goal of 55 ml/hr to provide pt with 1584 kcal and 71g protein. This order meets nutritional needs. Full assessment on designated f/u date.     VALERY JONELLE FISCHER, MS, RD, LDN  Clinical Dietitian, [facility]  Clinical Dietitian Office: [phone]

## 2024-03-22 NOTE — Progress Notes (Signed)
 Nutrition Follow Up  Available via secure chat or call RD office [phone] if offline    [facility] Tennis Fitch is a 43 y.o. female  Reason for visit: Follow-up      Nutrition Interventions and/or Recommendations:    Recommend to evaluate the patency of the SMA stent.  If patent - then continue current TF as able  If not patent- then J-tube is indicated or possibly PN     Recommended UQ:Wlumzw 1.5 goal of 12ml/hr (1440kcal, 65g protein fluid, 169g carbohdrate, 0g fiber)    Malnutrition   Insufficient evidence    Hospital problem:  Intractable nausea and vomiting  42YO female with PMH including IDDM, gastroparesis, SMAS s/p stenting, HTN, HLD, CKD3,; who presented with n/v and abd pain. Reports 20lb weight loss in 1 year.    6/6 nephrology- progression to ESRD in coming months is likely  New HF dx  6/9 developed severe abdominal pain and vomiting  6/10 active vomiting before eating; didn't tolerate dinner last night  6/11 severe abd pain and in back; n/v. DHT placed and TF started  6/11 tip of feeding tube was in the distal stomach  6/12 TF put on hold.  Pt c/o 10/10 pain and nausea  6/12 DHT confirm in the junction of 2nd and 3rd duodenum.    Last imaging that speaks of SMA stent patency:  01/22/24 MRI -  Significant susceptibility artifact from SMA stent limits  evaluation of in stent patency    Current status/nutrition evaluation:  Reviewed due to follow up.   C/o pain with TF - currently on hold  N/v with po diet - currently NPO  Recommend to evaluate with this SMA stent is patent.      Pt is at  nutrition risk.     Anthropometrics   Height: 149.9 cm (4' 11)   Admit weight: 56.2 kg (124 lb) (03/09/24 2318)   Current weight:58.2 kg (128 lb 4.9 oz) (03/22/24 0727)   Body mass index is 25.91 kg/m. Overweight (BMI 25-29.9)  Desirable body weight: 95 lb (43.1 kg)       Usual body weight:        Wt Readings from Last 10 Encounters:   03/22/24 58.2 kg (128 lb 4.9 oz)   02/14/24 57.7 kg (127 lb 1.6 oz)    01/31/24 58.7 kg (129 lb 6.6 oz)   01/31/24 58.2 kg (128 lb 4.9 oz)   01/28/24 56.4 kg (124 lb 5.4 oz)   10/21/23  55.2kg (121lb)  09/22/23  56.7kg (125lb)  07/19/23  59kg  05/08/23  60kg  02/17/23  61.2kg      Food/Nutrition-Related History and Client history  Food Allergies/intolerances:  No Known Allergies  Pertinent Social history:  Cultural/Food Preferences: none noted      Social determinants of health food insecurity screening completed this admission?   Food Insecurity: No Food Insecurity (03/10/2024)    Hunger Vital Sign    . Worried About Programme researcher, broadcasting/film/video in the Last Year: Never true    . Ran Out of Food in the Last Year: Never true   Recent Concern: Food Insecurity - Food Insecurity Present (01/31/2024)    Hunger Vital Sign    . Worried About Programme researcher, broadcasting/film/video in the Last Year: Often true    . Ran Out of Food in the Last Year: Often true       Current Nutrition:  DIET NPO Effective Now  tube feeding (ISOSOURCE HN) liquid  PO intake:    npo + TF on hold  Nutrition adequacy:   not adequate      Biochemical Data, Medications and Procedures:  Pertinent Labs: reviewed    Recent Labs   Lab 03/16/24  0411 03/17/24  0646 03/17/24  0646 03/18/24  0603 03/19/24  0553 03/20/24  0440 03/21/24  1047 03/22/24  0508   NA 137 136  --  136 136 141 134* 131*   K 5.0 5.5*  --  5.0 4.9 4.0 3.8 4.2   PHOS  --   --    < > 7.5*  --  6.7* 6.0* 5.5*   MG 2.0 2.0  --  1.9 2.0  --   --   --    CL 107 107  --  105 107 107 100 101   CO2 25 23  --  21 21 22 24 25    BUN 48* 57*  --  62* 63* 60* 53* 48*   CREATININE 5.1* 5.4*  --  5.6* 5.6* 5.7* 5.5* 5.1*   GFR 10 10  --  9 9 9 9 10    CALCIUM  8.0* 7.8*  --  8.2* 8.3* 8.6* 8.3* 8.0*   ALKPHOS 106  --   --   --  104  --   --   --    ALT 21  --   --   --  29  --   --   --    AST 24  --   --   --  28  --   --   --    GLUCOSE 175* 166*  --  222* 118 141* 202* 152*    < > = values in this interval not displayed.   ,   Recent Labs     03/22/24  0508   ALB 2.0*     Recent Labs      03/21/24  0847 03/21/24  1137 03/21/24  1709 03/21/24  2020 03/22/24  0734 03/22/24  1204   POCGLU 147* 183* 145* 113 176* 133   ;   Lab Results   Component Value Date    HGBA1C 8.0 (H) 01/11/2024         Scheduled Meds:  . amLODIPine   10 mg Via Tube Daily   . aspirin   81 mg Via Tube Daily   . atorvastatin   80 mg Via Tube QHS   . busPIRone   5 mg Via Tube BID   . carvediloL   6.25 mg Via Tube BID CC   . clopidogreL   75 mg Via Tube Daily   . heparin  (porcine)  5,000 Units Subcutaneous Q8H SCH   . hydrALAZINE   25 mg Via Tube Q12H SCH   . insulin  NPH-REGULAR  5 Units Subcutaneous BID AC   . insulin  REGULAR (HumuLIN  R, NovoLIN  R) injection  0-4 Units Subcutaneous Q6H SCH   . lidocaine   2 patch Transdermal Q24H   . metoclopramide   10 mg Via Tube TID AC   . scopolamine   1 patch Transdermal Q72H     Continuous Infusions:  . dextrose  5%-sodium chloride  0.9% 75 mL/hr at 03/22/24 1523   . tube feeding Stopped (03/22/24 1019)     PRN Meds:.acetaminophen , dextrose , dextrose  50% in water , glucagon , nitroGLYcerin , ondansetron  **OR** ondansetron , oxyCODONE , promethazine , sodium chloride     Pertinent Procedures: see summary    Nutrition-Focused Physical Findings:  Overall Appearance:  (observed only - no overt losses) 6/6  Respiratory Status: Nasal cannula   .  Last BM Date: 03/21/24 (Per patient).    GI symptoms: Nausea     Edema: trace nonpitting generalized    Net IO Since Admission: 2,677 mL [03/22/24 1648]    RN Head/Neck Assessment/HEENT  R Eye: Vision - Impaired (03/22/24 1010)  L Eye: Vision - Impaired (03/22/24 1010)  Lips: Dry (03/17/24 1957)  Voice: Weak (03/17/24 1957)  Teeth: Missing teeth (03/22/24 1010)    Comparative standards   Based on   (58kg)  Energy: 1450-1740 (25-30kcal/kg)  Protein:  58-70g (1-1.2g/kg)  Fluid:  56ml/kcal or per MD      Nutrition Diagnosis:  -NI-2.1 Inadequate oral intake related to altered GI function/gastroparesis as evidenced by reported n/v PTA,  NPO now with DHT - resolved    Goal: po intake  >=75% of meals on average once on a solid diet (not meeting)    Nutrition Monitoring   npo, labs, meds, wts  Follow per policy    Visit Type: Follow-up  Nutritional Risk: High Risk  per RD clinical judgment    JON LELON PITTER MS RD LDN, MBA, MHA  [phone]

## 2024-05-07 NOTE — Progress Notes (Signed)
 Nutrition Assessment  Clinical Dietitian Office: [phone]    Melinda [name] is a 43 y.o. female.  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)    Intractable nausea and vomiting    Essential hypertension    CKD (chronic kidney disease) stage 5, GFR less than 15 ml/min (CMS/HHS-HCC)    Hypertensive emergency    Pertinent Past Medical History:  diabetes mellitus on insulin , diabetic gastroparesis hypertension, PAD, and CKD stage IV   Multiple recent admissions here at OSH (9 admits this year, generally r/t DM/GI symptoms), last [facility] admit with gastroparesis and uncontrolled HTN    Anthropometrics:  Admit weight: 52.5 kg (115 lb 11.2 oz) (05/01/24 1611) WC wt  Current weight:56.2 kg (123 lb 14.4 oz) (05/02/24 0032) Standing wt  Height: 149.9 cm (4' 11) (05/01/24 1611)    Body mass index is 25.02 kg/m. Overweight (BMI 25-29.9)  Ideal body weight:     Usual body weight:    Wt Readings from Last 6 Encounters:   05/02/24 56.2 kg (123 lb 14.4 oz)   04/20/24 52.9 kg (116 lb 10 oz)   04/20/24 54.4 kg (120 lb)   04/04/24 55.2 kg (121 lb 9.6 oz)   04/02/24 56.1 kg (123 lb 10.9 oz)   03/25/24 58.3 kg (128 lb 8.5 oz)     Weight trends: current standing wt consistent with typical wt fluctuations this year per review, wt mildly increased from earlier this month    Labs, Meds, & Procedures:  Pertinent Labs: phos 6.9 H , BUN 63 H , Cr 6.6 H , eGFR 13  Recent Labs   Lab 05/01/24  1736 05/02/24  0442 05/03/24  0514 05/04/24  0542 05/05/24  0454 05/06/24  0604   NA 138   < > 137 136 136 138   K 4.3   < > 4.7 4.6 5.1* 5.0   CL 105   < > 107 104 103 106   MG 1.8  --   --   --   --  2.1   PHOS 4.7*  --   --  6.1*  --  6.9*   CO2 20*   < > 22 24 21 22    BUN 44*   < > 43* 43* 53* 63*   CREATININE 4.8*   < > 4.8* 5.7* 6.4* 6.6*   CALCIUM  8.9   < > 8.5* 7.9* 7.7* 7.8*   ALKPHOS 124*  --  101 101  --   --    ALT 36  --   25 23  --   --    AST 30  --  27 21  --   --     < > = values in this interval not displayed.     Recent Labs     05/06/24  1153 05/06/24  1647 05/06/24  2010 05/07/24  0731 05/07/24  1144 05/07/24  1454   POCGLU 125 139 207* 110 133 125      Net IO Since Admission: 2,890 mL [05/07/24 1653]    Pertinent Medications:   . [Transfer Hold] acetaminophen   975 mg Oral TID   . [Transfer Hold] amLODIPine   10 mg Oral Daily   . [Transfer Hold] aspirin   81 mg Oral Daily   . [Transfer Hold] atorvastatin   80 mg Oral QHS   . [Transfer Hold] busPIRone   5 mg  Oral BID   . [Transfer Hold] carvediloL   12.5 mg Oral BID CC   . [Held by provider] clopidogreL   75 mg Oral Daily   . [Transfer Hold] enoxaparin   30 mg Subcutaneous Daily   . [Transfer Hold] famotidine   20 mg Oral Daily   . [Held by provider] FUROsemide   40 mg Intravenous Q12H   . [Transfer Hold] heparin  (porcine)   Intracatheter As Directed Admin   . [Transfer Hold] heparin   1,000 Units XX Dialysis    And   . [Transfer Hold] heparin   500 Units/hr XX Dialysis   . [Transfer Hold] hydrALAZINE   50 mg Oral TID   . [Transfer Hold] hyoscyamine  0.125 mg Oral TID   . [Transfer Hold] insulin  NPH-REGULAR  8 Units Subcutaneous Daily before breakfast    And   . [Transfer Hold] insulin  NPH-REGULAR  8 Units Subcutaneous Daily before dinner   . [Transfer Hold] lidocaine   2 patch Transdermal Q24H   . [Transfer Hold] metoclopramide   5 mg Oral TID AC   . [Transfer Hold] mirtazapine  15 mg Oral QHS   . [Transfer Hold] polyethylene glycol  17 g Oral Daily   . [Transfer Hold] sennosides-docusate  2 tablet Oral BID      Pertinent Procedures: Permacath placement today, in procedure.    Nutrition/Intake:  Current Nutrition:   Diet renal  Oral Supplements - Adult Lunch; All Supplements; Novasource Renal-Assorted  Appetite: poor/fair  Intake adequacy: inadequate - averaging 25-50% since admit, currently NPO  Food Allergies/Intolerances: NKFA     Nutrition-Focused Physical Findings:  Overall  Appearance:    Respiratory Status: Nasal cannula  GI Function: Normal, Appetite changes (today, N/V/abd pain resolved)  Last BM Date: 05/06/24  Skin: device entry to throat  Edema: None (05/07/24 0845) (per RN assessment in flowsheet)  LUE Edema: Trace, non-pitting (05/04/24 0737)  RUE Edema: Trace, non-pitting (05/04/24 0737)  LLE Edema: Trace, non-pitting (05/06/24 2106)  RLE Edema: Trace, non-pitting (05/06/24 2106)  Generalized Edema: Trace, non-pitting (05/06/24 2106)     Estimated Nutritional Needs:   Based on Current weight (56.2 kg)  Energy (kcals):  1405-1965 kcal (25-35 kcal/kg) *pending new HD  Protein (g):  45 g (0.8 g/kg) *adjust pending HD  Fluid (mls):  per MD  _________________________________________________    Summary:  Patient presented with severe abd pain/N/V and admitted with gastroparesis diabeticorum.  Nephrology following d/t progression of kidney disease to stage 5, plan for placement of PC and iHD after, vascular surgery planning for OR Tuesday for L arm AVG. Note elevated phosphorus, no binders noted in MAR.  Nephro noted elevated phos and plans on evaluating need for binders after initiation of dialysis.  MD suspecting etiology of abd pain on admit is multifactorial: anasarca vs gastroparesis, vs mesenteric ischemia vs uremia or all together.    Patient assessed today d/t length of stay, no positive UAF screening triggers on admit.  BMI - overweight for age with stable recent wt hx, mild increase this month but at typical BW.  Had been receiving a Renal diet with hyperphosphatemia.  PO intakes of 25-50% since admit, currently denying N/V/abd pain today but also NPO.  Will order ONS to aid in meeting needs and ENN will need to be adjusted once HD is initiated.  Pt currently in procedure, remains NPO.      Per clinical judgement, patient is at high nutritional risk.    Nutrition Diagnosis:  -NI-2.1 Inadequate oral intake related to poor PO tolerance/appetite changes as evidenced  by N/V/abd  pain on admit, PO intakes < 50% since admit        Nutrition Intervention:  ND-1.1 Meal and Snacks - General/healthful diet   Continue Renal diet as tolerated.    ND-3.1.1 Medical Food Supplements - Commercial beverage   Novasource Renal - assorted - with lunch (consider adjusting once HD initiated)    Goal: PO intake > 75%    Monitoring/Evaluation:   1. Food/nutrition related concerns: adequacy and tolerance of PO diet + ONS  2. Anthropometric: weight trend  3. Biochemical: labs ; renal fxn; glycemic control  4. Nutrition-focused physical findings: NFPE when able    Follow up per policy.     Food Insecurity: No Food Insecurity (05/02/2024)    Hunger Vital Sign    . Worried About Programme researcher, broadcasting/film/video in the Last Year: Never true    . Ran Out of Food in the Last Year: Never true   Recent Concern: Food Insecurity - Food Insecurity Present (04/02/2024)    Hunger Vital Sign    . Worried About Programme researcher, broadcasting/film/video in the Last Year: Often true    . Ran Out of Food in the Last Year: Often true          Visit Type: Initial assessment   Time Spent: 35 Minute(s)   Nutritional Risk: High Risk      ZOE WEBRE  MCN RD/LDN  Clinical Dietitian Office: [phone]

## 2024-05-15 NOTE — Progress Notes (Signed)
 05/15/24 1131   Malnutrition   Malnutrition Assessment for Adult   Adult Malnutrition Criteria moderate muscle mass loss;mild subcutaneous fat loss;>5% unintentional weight loss in 1 month;energy intake meeting < or equal to 75% of estimated requirement for > or equal to 1 month   Adult Malnutrition Recommendations Recommend supplemental enteral nutrition support;Recommend trend weight status  (gastroparesis diet)

## 2024-05-15 NOTE — Consults (Signed)
 Nutrition Assessment Follow-Up    Melinda [name] is a 43 y.o. female.  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)    Intractable nausea and vomiting    Essential hypertension    CKD (chronic kidney disease) stage 5, GFR less than 15 ml/min (CMS/HHS-HCC)    Hypertensive emergency     Pertinent Past Medical History:  diabetes mellitus on insulin , diabetic gastroparesis hypertension, PAD, and CKD stage IV   Multiple recent admissions here at OSH (9 admits this year, generally r/t DM/GI symptoms), last [facility] admit with gastroparesis and uncontrolled HTN    Anthropometrics:  Admit weight: 52.5 kg (115 lb 11.2 oz) (05/01/24 1611)   Current weight:54.7 kg (120 lb 9.5 oz) (05/15/24 0546)  Height: 149.9 cm (4' 11) (05/08/24 1408)    Body mass index is 24.36 kg/m. Overweight (BMI 25-29.9)  Reports UBW 128 lbs per pt  Wt Readings from Last 1 Encounters:   05/15/24 54.7 kg (120 lb 9.5 oz) standing   05/13/24 57.1 kg (125 lbs (14.1 oz) standing  05/11/24 58.3 kg (128 lb 8.5 oz) standing  04/20/24 52.9 kg (116 lb 10 oz)  04/20/24 54.4 kg (120 lb)  04/04/24 55.2 kg (121 lb 9.6 oz)  04/02/24 56.1 kg (123 lb 10.9 oz)  03/25/24 58.3 kg (128 lb 8.5 oz)  01/11/24 54 kg (119 lb)   10/21/23 55.2kg (121 lb 11.1 oz)  6% wt loss in 5 weeks    Labs, Meds, & Procedures:  Pertinent Labs: BUN 29, Creat 4.6, Phos 4.7  Pertinent Medications: ferric gluconate lasix , insulin , miralax  senokot-s  Pertinent Procedures:Started on HD via permacath as AVG cannot be used for 3 weeks.     Nutrition/Intake:  Current Nutrition:    Oral Supplements - Adult Lunch; All Supplements; Novasource Renal-Assorted  DIET NPO Except for: MEDS Effective Now    Appetite: n/a  Intake adequacy: not adequate  Food Allergies/Intolerances: NKFA    Nutrition-Focused Physical Findings:  Overall Appearance: loss of muscle mass, loss of subcutaneous  fat  Respiratory Status: Nasal cannula  GI Function: Abdominal pain, Nausea  Last BM Date: 05/13/24  Skin: trace edema left arm cath site    Estimated Nutritional Needs:   Based on  weight (56.2 kg)  Energy (kcals):  1405-1965 kcal (25-35 kcal/kg)  Protein (g):  67-78 g (1.2-1.4 g/kg) (began HD)  Fluid (mls):  per MD  _________________________________________________    Summary:  Patient presented with severe abd pain/N/V and admitted with gastroparesis diabeticorum.  Nephrology following d/t progression of kidney disease to stage 5, plan for placement of PC and iHD after, vascular surgery planning for OR Tuesday for L arm AVG. Note elevated phosphorus, no binders noted in MAR.  Nephro noted elevated phos and plans on evaluating need for binders after initiation of dialysis.  MD suspecting etiology of abd pain on admit is multifactorial: anasarca vs gastroparesis, vs mesenteric ischemia vs uremia or all together.     Patient assessed today for follow up after initial assessment d/t length of stay, no positive UAF screening triggers on admit.  BMI - overweight for age with stable recent wt hx, mild increase this month but at typical BW.  Continues on Renal diet with hyperphosphatemia now resolved with dialysis.  PO intakes of 25-50% since admit, reporting improving N/V since starting HD.  Reports only vomiting once yesterday and today before breakfast, that  the nausea resolves quickly and she is able to eat in spite of it.  Eating 50-75% today/yesterday and reports appetite improving.  She is also drinking 100% of the Novasource, likely almost meeting needs and expect continued improvement with continued HD.  Noted no wasting today during encounter, pt pleased with Renal diet so far.      F/U and consult for TF. Patient continues to have N/V and abdominal pain. Plan to start TF via DHT. Patient now NPO. Patient reports abdominal pain is constant and not better or worse with po intake. She drinks 1 protein shake a day  and eats some at meals. Sister interpreted. Patient does not want TF. Discussed benefits. Provided handout on Gastroparesis diet.   Per clinical judgment, patient's nutrition risk is: High Risk     Nutrition Diagnosis:  -NC-1.4 Altered GI function related to gastroparesis as evidenced by N/V abd pain  Moderate PCM dx 8/5    Nutrition Intervention:  ND-1.1 Meal and Snacks - General/healthful diet  Gastroparesis handout in Spanish and English    ND-2.1.1 Enteral Nutrition - Formula/solution   Novasource renal  Goal rate: 35 ml/hr (1680 kcal and 76 g protein    Goal: start TF    Monitoring/Evaluation:   Labs  Pain post TF?  Weights    Follow up per policy.     Visit Type: Follow-up   Time Spent: 35 Minute(s)   Nutritional Risk: High Risk    Melinda KANDICE BLIZZARD, MS RD LDN  Clinical Dietitian [phone]

## 2024-05-17 NOTE — Consults (Signed)
 Consult to start TF. Patient with gastroparesis, CKD IV, DM +abd pain.     Recent Labs     05/16/24  0614   NA 136   CL 97*   K 3.6   MG 2.1   PHOS 5.5*   CALCIUM  8.6*   BUN 16   GLUCOSE 136   ALB 2.7*       Patient at high risk of refeeding. Currently Phos elevated. Monitor for drop in electrolytes and increase in BG.   -requested thiamine  and folic acid    Will start with 15 ml/hr to provide 15 kcal/kg d/t refeeding risk.   Novasource renal  Goal rate: 35 ml/hr (1680 kcal and 76 g protein)

## 2024-05-18 NOTE — Progress Notes (Signed)
 Nutrition Assessment Follow-Up    [facility] Melinda [name] is a 43 y.o. female.  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)    Intractable nausea and vomiting    Essential hypertension    CKD (chronic kidney disease) stage 5, GFR less than 15 ml/min (CMS/HHS-HCC)    Hypertensive emergency     Pertinent Past Medical History:  diabetes mellitus on insulin , diabetic gastroparesis hypertension, PAD, and CKD stage IV   Multiple recent admissions here at OSH (9 admits this year, generally r/t DM/GI symptoms), last [facility] admit with gastroparesis and uncontrolled HTN    Anthropometrics:  Admit weight: 52.5 kg (115 lb 11.2 oz) (05/01/24 1611)   Current weight:53.1 kg (117 lb 1 oz) (05/18/24 0600)  Height: 149.9 cm (4' 11) (05/08/24 1408)    Body mass index is 23.64 kg/m. Normal (BMI 18.5-24.9)  Reports UBW 128 lbs per pt  Wt Readings from Last 1 Encounters:   05/15/24 54.7 kg (120 lb 9.5 oz) standing   05/13/24 57.1 kg (125 lbs (14.1 oz) standing  05/11/24 58.3 kg (128 lb 8.5 oz) standing  04/20/24 52.9 kg (116 lb 10 oz)  04/20/24 54.4 kg (120 lb)  04/04/24 55.2 kg (121 lb 9.6 oz)  04/02/24 56.1 kg (123 lb 10.9 oz)  03/25/24 58.3 kg (128 lb 8.5 oz)  01/11/24 54 kg (119 lb)   10/21/23 55.2kg (121 lb 11.1 oz)  6% wt loss in 5 weeks  -Wt stable since admit    Labs, Meds, & Procedures:  Pertinent Labs: K 3.1 L Mg 1.8 WNL Phos 4.6 H    Pertinent Medications:   Scheduled:   . acetaminophen   975 mg Via Tube TID   . amLODIPine   10 mg Via Tube Daily   . aspirin   81 mg Via Tube Daily   . atorvastatin   80 mg Via Tube QHS   . busPIRone   5 mg Via Tube BID   . carvediloL   12.5 mg Via Tube BID CC   . clidinium-chlordiazePOXIDE  1 capsule Oral TID   . [Held by provider] clopidogreL   75 mg Oral Daily   . epoetin  alfa-epbx  50 Units/kg (Dosing Weight) Intravenous Dialysis   . escitalopram oxalate  10 mg Via Tube Daily   .  famotidine   20 mg Via Tube Daily   . ferric gluconate (FERRLECIT) IVPB  125 mg Intravenous Dialysis   . folic acid  1 mg Via Tube Daily   . [Held by provider] FUROsemide   40 mg Intravenous Q12H   . heparin  (porcine)  5,000 Units Subcutaneous Q8H SCH   . heparin  (porcine)   Intracatheter As Directed Admin   . heparin   1,000 Units XX Dialysis    And   . heparin   500 Units/hr XX Dialysis   . hydrALAZINE   50 mg Via Tube TID   . hyoscyamine  0.125 mg Oral TID   . [Held by provider] insulin  NPH-REGULAR  8 Units Subcutaneous Daily before breakfast    And   . [Held by provider] insulin  NPH-REGULAR  8 Units Subcutaneous Daily before dinner   . insulin  REGULAR (HumuLIN  R, NovoLIN  R) injection  0-4 Units Subcutaneous Q6H SCH   . lidocaine   2 patch Transdermal Q24H   . LORazepam   0.5 mg Intravenous Once   . mirtazapine  15 mg Via Tube QHS   . polyethylene glycol  17 g Via  Tube Daily   . sennosides-docusate  2 tablet Via Tube BID   . thiamine  (Vitamin B-1) injection  200 mg Intravenous Daily     Infusions:   . tube feeding 35 mL/hr at 05/18/24 1506     PRN: dextrose , dextrose  50% in water , glucagon , hydrALAZINE , HYDROmorphone , lidocaine , melatonin, prochlorperazine , promethazine , traZODone     Pertinent Procedures:Started on HD via permacath as AVG cannot be used for 3 weeks.     Nutrition/Intake:  Current Nutrition:    DIET NPO Effective Now  tube feeding (NOVASOURCE RENAL) liquid    Intake adequacy: NPO/TF  Food Allergies/Intolerances: NKFA    Nutrition-Focused Physical Findings:  Overall Appearance: loss of muscle mass, loss of subcutaneous fat  GI Function: Abdominal pain  Last BM Date: 05/15/24  Skin: surgical incision left arm    Estimated Nutritional Needs:   Based on  weight (56.2 kg)  Energy (kcals):  1325-1590 (25-30 kcal/kg)  Protein (g):  64-74 (1.2-1.4 g/kg)  Fluid (mls):  Per MD  _________________________________________________    Summary:  Patient presented with severe abd pain/N/V and admitted with gastroparesis  diabeticorum.  Nephrology following d/t progression of kidney disease to stage 5, started on HD.  MD suspecting etiology of abd pain on admit is multifactorial: anasarca vs gastroparesis, vs mesenteric ischemia vs uremia or all together.     Started on TF yesterday via DHT. Send secure message to Dr. Vila regarding advancement of TF today and he said ok to advance as tolerated to goal rate. Continues to have abdominal pain per MD note.      Per clinical judgment, patient's nutrition risk is: High Risk     Nutrition Diagnosis:  -NC-1.4 Altered GI function related to gastroparesis as evidenced by N/V abd pain  Moderate PCM dx 8/5    Nutrition Intervention:  ND-2.1.1 Enteral Nutrition - Formula/solution  Access:  Dobbhoff (NJ-tube)  Formula:  Novasource Renal (2.0 cal/ml) or equivalent  Methods:  Continuous (x24 hrs) - 35 ml/hr  Flushes:  30 mls every 4 hrs  with tap water   Modular:  None    Recommend initiating:   Start TF at [employer].   Advance rate by 15 mls every 8 hrs, as tolerated to goal rate.    Additional instructions:  Weigh patient regularly  Elevate head of bed >30 degrees during feeding  Residuals: check every 4 hours per protocol  Monitor electrolytes (Mg, K+, PO4) daily as patient is at risk for refeeding, recommend supplementing aggressively as needed    Tube feeding at goal rate provides 1684 kcals (100% estimated energy needs), 78 g protein (100% protein needs), 152 g carbohydrate, 0 g fiber, and 596 mls fluid not including water  flushes.     Goal: start TF - met  Pt to tolerate TF at goal rate at f/u    Monitoring/Evaluation:   Labs  Pain post TF?  Weights    Follow up per policy.     Visit Type: Follow-up   Time Spent: 30 Minute(s)   Nutritional Risk: High Risk    KATHRYN CASSELLIUS, MS RD LDN  Clinical Dietitian [phone]

## 2024-05-23 NOTE — Progress Notes (Signed)
 Nutrition Assessment Follow-Up    [facility] Tennis Fitch is a 43 y.o. female.  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)    Intractable nausea and vomiting    Essential hypertension    CKD (chronic kidney disease) stage 5, GFR less than 15 ml/min (CMS/HHS-HCC)    Hypertensive emergency     Pertinent Past Medical History:  diabetes mellitus on insulin , diabetic gastroparesis hypertension, PAD, and CKD stage IV   Multiple recent admissions here at OSH (9 admits this year, generally r/t DM/GI symptoms), last [facility] admit with gastroparesis and uncontrolled HTN    Anthropometrics:  Admit weight: 52.5 kg (115 lb 11.2 oz) (05/01/24 1611)   Current weight:53.8 kg (118 lb 9.7 oz) (05/23/24 0547)  Height: 149.9 cm (4' 11) (05/08/24 1408)    Body mass index is 23.96 kg/m. Normal (BMI 18.5-24.9)  Reports UBW 128 lbs per pt  Wt Readings from Last 1 Encounters:   05/15/24 54.7 kg (120 lb 9.5 oz) standing   05/13/24 57.1 kg (125 lbs (14.1 oz) standing  05/11/24 58.3 kg (128 lb 8.5 oz) standing  04/20/24 52.9 kg (116 lb 10 oz)  04/20/24 54.4 kg (120 lb)  04/04/24 55.2 kg (121 lb 9.6 oz)  04/02/24 56.1 kg (123 lb 10.9 oz)  03/25/24 58.3 kg (128 lb 8.5 oz)  01/11/24 54 kg (119 lb)   10/21/23 55.2kg (121 lb 11.1 oz)  6% wt loss in 5 weeks  -Wt stable since admit    Labs, Meds, & Procedures:  Pertinent Labs:   Recent Labs   Lab 05/22/24  0501 05/23/24  0508   NA 137 140   K 4.3 4.0   CL 103 107   CO2 26 25   BUN 20 33*   CREATININE 3.6* 4.3*   CALCIUM  7.7* 8.2*   ALKPHOS 101  --    ALT 42*  --    AST 52*  --    GLUCOSE 212* 200*   BUN/Creat H   LFT's H   Serum Glucose 200mg /dL H     Pertinent Medications:   Scheduled:   . acetaminophen   975 mg Oral TID   . amLODIPine   10 mg Oral Daily   . aspirin   81 mg Oral Daily   . atorvastatin   80 mg Oral QHS   . busPIRone   5 mg Oral BID   . carvediloL   12.5 mg Via Tube BID CC   .  clidinium-chlordiazePOXIDE  1 capsule Oral TID   . [Held by provider] clopidogreL   75 mg Oral Daily   . epoetin  alfa-epbx  6,000 Units Intravenous Dialysis   . escitalopram oxalate  10 mg Oral Daily   . ferric gluconate (FERRLECIT) IVPB  125 mg Intravenous Dialysis   . folic acid  1 mg Oral Daily   . [Held by provider] FUROsemide   40 mg Intravenous Q12H   . heparin  (porcine)  5,000 Units Subcutaneous Q8H SCH   . heparin  (porcine)   Intracatheter As Directed Admin   . heparin   1,000 Units XX Dialysis    And   . heparin   500 Units/hr XX Dialysis   . hydrALAZINE   50 mg Oral TID   . hydrOXYzine  (ATARAX ,VISTARIL ) oral  10 mg Oral TID   . hyoscyamine  0.125 mg Oral TID   . [Held by provider] insulin  NPH-REGULAR  8 Units Subcutaneous Daily before breakfast  And   . [Held by provider] insulin  NPH-REGULAR  8 Units Subcutaneous Daily before dinner   . insulin  REGULAR (HumuLIN  R, NovoLIN  R) injection  0-4 Units Subcutaneous Q6H SCH   . lidocaine   2 patch Transdermal Q24H   . mirtazapine  15 mg Oral QHS   . pantoprazole  (PROTONIX ) IV  40 mg Intravenous Daily   . polyethylene glycol  17 g Oral Daily   . pregabalin  75 mg Oral QHS   . sennosides-docusate  2 tablet Oral BID   . tiZANidine  2 mg Oral TID     Infusions:   . tube feeding Stopped (05/21/24 0000)     PRN: dextrose  50% in water , glucagon , hydrALAZINE , lidocaine , melatonin, prochlorperazine , promethazine , traZODone     Pertinent Procedures:Started on HD via permacath as AVG cannot be used for 3 weeks.     Nutrition/Intake:  Current Nutrition:    tube feeding (NOVASOURCE RENAL) liquid  DIET NPO HOLD tube feeding while NPO Effective Now    Intake adequacy: Inadequate - NPO, no TF access  Food Allergies/Intolerances: NKFA    Nutrition-Focused Physical Findings:  Overall Appearance: loss of muscle mass, loss of subcutaneous fat  GI Function: Abdominal pain  Last BM Date: 05/22/24  Skin: surgical incision left arm    Estimated Nutritional Needs:   Based on  weight (56.2  kg)  Energy (kcals):  1325-1590 (25-30 kcal/kg)  Protein (g):  64-74 (1.2-1.4 g/kg)  Fluid (mls):  Per MD  _________________________________________________    Summary:  Patient presented with severe abd pain/N/V and admitted with gastroparesis diabeticorum.  Nephrology following d/t progression of kidney disease to stage 5, started on HD.  MD suspecting etiology of abd pain on admit is multifactorial: anasarca vs gastroparesis, vs mesenteric ischemia vs uremia or all together.     Previously on TF via DHT. Was to get J tube placed, however pt expressed interest in food, DHT removed, pt has continued to experience N/V, not meeting needs via PO, orders to replace NGT, pt refusing. Not meeting nutritional needs, recommend post pyloric percutaneous feeding tube. Will monitor clinical course.      Per clinical judgment, patient's nutrition risk is: High Risk     Nutrition Diagnosis:  -NC-1.4 Altered GI function related to gastroparesis as evidenced by N/V abd pain - ongoing  Moderate PCM dx 8/5    Nutrition Intervention:  ND-2.1.1 Enteral Nutrition - Formula/solution  Access:  Dobbhoff (NJ-tube)  Formula:  Novasource Renal (2.0 cal/ml) or equivalent  Methods:  Continuous (x24 hrs) - 35 ml/hr  Flushes:  30 mls every 4 hrs  with tap water   Modular:  None    Tube feeding at goal rate provides 1684 kcals (100% estimated energy needs), 78 g protein (100% protein needs), 152 g carbohydrate, 0 g fiber, and 596 mls fluid not including water  flushes.     Goal: start TF - not met  Pt to tolerate TF at goal rate at f/u - not met    Monitoring/Evaluation:   Labs  TF  Weights    Follow up per policy.     Visit Type: Follow-up   Time Spent: 30 Minute(s)   Nutritional Risk: High Risk    VALERY JONELLE FISCHER, MS RD LDN  Clinical Dietitian [phone]

## 2024-05-25 NOTE — Progress Notes (Signed)
 Food and/or Nutrient Delivery: ND-2.1.1 Enteral Nutrition - Formula/solution  Access:  J-port of GJ-tube  Formula:  Novasource Renal (2.0 cal/ml) or equivalent  Methods:  Continuous (x24 hrs) - 30 ml/hr  Flushes:  30 mls every 4 hrs  with tap water     Recommend initiating:   Start TF at 15 mls/hr.   Advance rate by 15 mls every 8 hrs, as tolerated to goal rate.    Tube feeding at goal rate provides 1440 kcals (100% estimated energy needs), 67 g protein (100% protein needs), 131g carbohydrate, 0 g fiber, and 511 mls fluid not including water  flushes.       Once tolerating at goal, can do nocturnal cycle of 2mL/hr x15h. This order meets minimal nutritional needs. Pt likely to take additional PO per MD, once pt not tolerating diet, recommend increasing feeds to 82mL/hr over 15h to provide 1684 kcal and 78g protein.    VALERY JONELLE FISCHER, MS, RD, LDN  Clinical Dietitian, [facility]  Clinical Dietitian Office: [phone]

## 2024-05-28 NOTE — Consults (Signed)
 Nutrition Assessment Follow-Up    Melinda [name] is a 43 y.o. female.  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)    Intractable nausea and vomiting    Essential hypertension    CKD (chronic kidney disease) stage 5, GFR less than 15 ml/min (CMS/HHS-HCC)    Hypertensive emergency     Pertinent Past Medical History:  diabetes mellitus on insulin , diabetic gastroparesis hypertension, PAD, and CKD stage IV   Multiple recent admissions here at OSH (9 admits this year, generally r/t DM/GI symptoms), last [facility] admit with gastroparesis and uncontrolled HTN    Anthropometrics:  Admit weight: 52.5 kg (115 lb 11.2 oz) (05/01/24 1611)   Current weight:54.6 kg (120 lb 5.9 oz) (05/28/24 0421)  Height: 149.9 cm (4' 11) (05/08/24 1408)    Body mass index is 24.31 kg/m. Normal (BMI 18.5-24.9)  Reports UBW 128 lbs per pt  Wt Readings from Last 1 Encounters:   05/15/24 54.7 kg (120 lb 9.5 oz) standing   05/13/24 57.1 kg (125 lbs (14.1 oz) standing  05/11/24 58.3 kg (128 lb 8.5 oz) standing  04/20/24 52.9 kg (116 lb 10 oz)  04/20/24 54.4 kg (120 lb)  04/04/24 55.2 kg (121 lb 9.6 oz)  04/02/24 56.1 kg (123 lb 10.9 oz)  03/25/24 58.3 kg (128 lb 8.5 oz)  01/11/24 54 kg (119 lb)   10/21/23 55.2kg (121 lb 11.1 oz)  6% wt loss in 5 weeks  -Wt stable since admit    Labs, Meds, & Procedures:  Pertinent Labs:   Recent Labs   Lab 05/22/24  0501 05/23/24  0508 05/28/24  0401   NA 137   < > 135   K 4.3   < > 3.4*   CL 103   < > 101   CO2 26   < > 25   BUN 20   < > 35*   CREATININE 3.6*   < > 4.5*   CALCIUM  7.7*   < > 7.8*   ALKPHOS 101  --   --    ALT 42*  --   --    AST 52*  --   --    GLUCOSE 212*   < > 227*    < > = values in this interval not displayed.   K L  BUN/Creat H   LFT's H   Serum Glucose 227mg /dL H     Pertinent Medications:   Scheduled:   . acetaminophen   975 mg J Tube TID   . amLODIPine   10 mg Via  Tube Daily   . aspirin   81 mg Via Tube Daily   . atorvastatin   80 mg Via Tube QHS   . busPIRone   5 mg Via Tube BID   . carvediloL   12.5 mg Via Tube BID CC   . clopidogreL   75 mg Via Tube Daily   . epoetin  alfa-epbx  6,000 Units Intravenous Dialysis   . escitalopram oxalate  10 mg Via Tube Daily   . ferric gluconate (FERRLECIT) IVPB  125 mg Intravenous Dialysis   . folic acid  1 mg Via Tube Daily   . heparin  (porcine)  5,000 Units Subcutaneous Q8H SCH   . heparin  (porcine)   Intracatheter As Directed Admin   . heparin   2,000 Units XX Dialysis    And   . heparin   500 Units/hr XX Dialysis   .  hydrALAZINE   50 mg Via Tube TID   . insulin  NPH-REGULAR  8 Units Subcutaneous Daily before breakfast    And   . insulin  NPH-REGULAR  8 Units Subcutaneous Daily before dinner   . insulin  REGULAR (HumuLIN  R, NovoLIN  R) injection  0-4 Units Subcutaneous 4x Daily   . lidocaine   2 patch Transdermal Q24H   . mirtazapine  15 mg Oral QHS   . omeprazole oral suspension 2 mg/mL  20 mg J Tube Daily   . [Held by provider] polyethylene glycol  17 g J Tube Daily   . tube feeding   Via Tube Cyclic - Night 15 hrs (1800-0900)     Infusions:       PRN: dextrose , dextrose  50% in water , glucagon , hydrALAZINE , hydrOXYzine  (ATARAX ,VISTARIL ) oral, lidocaine , prochlorperazine , promethazine , traZODone     Pertinent Procedures: s/p J tube    Nutrition/Intake:  Current Nutrition:    Diet carbohydrate level 2 (60 gm/meal)  tube feeding (NOVASOURCE RENAL) liquid    Intake adequacy: Inadequate via PO, adequate via TF  Food Allergies/Intolerances: NKFA    Nutrition-Focused Physical Findings:  Overall Appearance: loss of muscle mass, loss of subcutaneous fat  GI Function: Abdominal pain  Last BM Date: 05/27/24  Skin: surgical incision left arm    Estimated Nutritional Needs:   Based on  weight (56.2 kg)  Energy (kcals):  1325-1590 (25-30 kcal/kg)  Protein (g):  64-74 (1.2-1.4 g/kg)  Fluid (mls):  Per  MD  _________________________________________________    Summary:  Patient presented with severe abd pain/N/V and admitted with gastroparesis diabeticorum.  Nephrology following d/t progression of kidney disease to stage 5, started on HD.  MD suspecting etiology of abd pain on admit is multifactorial: anasarca vs gastroparesis, vs mesenteric ischemia vs uremia or all together.     Previously on TF via DHT. Was to get J tube placed, however pt expressed interest in food, DHT removed, pt has continued to experience N/V, not meeting needs via PO, pt now s/p GJ tube, feeds via J port, adjusted to cyclic regimen today - outlined below. Will monitor clinical course.      Per clinical judgment, patient's nutrition risk is: High Risk     Nutrition Diagnosis:  -NC-1.4 Altered GI function related to gastroparesis as evidenced by N/V abd pain - ongoing  Moderate PCM dx 8/5    Nutrition Intervention:  ND-2.1.1 Enteral Nutrition - Formula/solution  Access:  D[place]hoff (NJ-tube)  Formula:  Novasource Renal (2.0 cal/ml) or equivalent  Methods:  Cyclic - 48 ml/hr x15 hrs (start at 1800 and stop at 0900)  Flushes:  30 mls every 4 hrs  with tap water   Modular:  None    Tube feeding at goal rate provides 1684 kcals (100% estimated energy needs), 78 g protein (100% protein needs), 152 g carbohydrate, 0 g fiber, and 596 mls fluid not including water  flushes.     Goal: start TF - met  Pt to tolerate TF at goal rate at f/u - not met    Monitoring/Evaluation:   Labs  TF  Weights    Follow up per policy.     Visit Type: Follow-up   Time Spent: 30 Minute(s)   Nutritional Risk: High Risk    VALERY JONELLE FISCHER, MS RD LDN  Clinical Dietitian [phone]

## 2024-05-31 NOTE — Progress Notes (Signed)
 Nutrition Assessment Follow-Up    Melinda [name] is a 43 y.o. female.  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 Normal (BMI 18.5-24.9)  Reports UBW 128 lbs per pt  Wt Readings from Last 1 Encounters:   05/15/24 54.7 kg (120 lb 9.5 oz) standing   05/13/24 57.1 kg (125 lbs (14.1 oz) standing  05/11/24 58.3 kg (128 lb 8.5 oz) standing  04/20/24 52.9 kg (116 lb 10 oz)  04/20/24 54.4 kg (120 lb)  04/04/24 55.2 kg (121 lb 9.6 oz)  04/02/24 56.1 kg (123 lb 10.9 oz)  03/25/24 58.3 kg (128 lb 8.5 oz)  01/11/24 54 kg (119 lb)   10/21/23 55.2kg (121 lb 11.1 oz)  6% wt loss in 5 weeks  -Wt stable since admit    Labs, Meds, & Procedures:  Pertinent Labs:   Recent Labs   Lab 05/30/24  0422   NA 134*   K 3.5   CL 102   CO2 25   BUN 41*   CREATININE 3.6*   CALCIUM  7.9*   GLUCOSE 189*   Na L  BUN/Creat H   Serum Glucose 189mg /dL H     Pertinent Medications:   Scheduled:   . acetaminophen   975 mg J Tube TID   . amLODIPine   10 mg Via Tube Daily   . aspirin   81 mg Via Tube Daily   . atorvastatin   80 mg Via Tube QHS   . busPIRone   5 mg Via Tube BID   . carvediloL   12.5 mg Via Tube BID CC   . clopidogreL   75 mg Via Tube Daily   . epoetin  alfa-epbx  6,000 Units Intravenous Dialysis   . escitalopram oxalate  15 mg Via Tube Daily   . folic acid  1 mg Via Tube Daily   . heparin  (porcine)  5,000 Units Subcutaneous Q8H SCH   . heparin  (porcine)   Intracatheter As Directed Admin   . hydrALAZINE   100 mg Via Tube TID   . hydrOXYzine  (ATARAX ,VISTARIL ) oral   10 mg Via Tube TID   . insulin  NPH-REGULAR  12 Units Subcutaneous Daily before dinner   . insulin  NPH-REGULAR  8 Units Subcutaneous Daily before breakfast   . insulin  REGULAR (HumuLIN  R, NovoLIN  R) injection  0-4 Units Subcutaneous 4x Daily   . lidocaine   2 patch Transdermal Q24H   . mirtazapine  15 mg Oral QHS   . omeprazole oral suspension 2 mg/mL  20 mg J Tube Daily   . [Held by provider] polyethylene glycol  17 g J Tube Daily   . tube feeding   Via Tube Cyclic - Night 15 hrs (1800-0900)     Infusions:       PRN: dextrose , dextrose  50% in water , glucagon , hydrALAZINE , lidocaine , LORazepam , prochlorperazine , promethazine , traZODone     Pertinent Procedures: s/p GJ tube    Nutrition/Intake:  Current Nutrition:    Diet carbohydrate level 2 (60 gm/meal)  tube  feeding (NOVASOURCE RENAL) liquid    Intake adequacy: Inadequate via PO, adequate via TF  Food Allergies/Intolerances: NKFA    Nutrition-Focused Physical Findings:  Overall Appearance: loss of muscle mass, loss of subcutaneous fat  GI Function: Abdominal pain  Last BM Date: 05/30/24  Skin: surgical incision left arm    Estimated Nutritional Needs:   Based on  weight (56.2 kg)  Energy (kcals):  1325-1590 (25-30 kcal/kg)  Protein (g):  64-74 (1.2-1.4 g/kg)  Fluid (mls):  Per MD  _________________________________________________    Summary:  Patient presented with severe abd pain/N/V and admitted with gastroparesis diabeticorum.  Nephrology following d/t progression of kidney disease to stage 5, started on HD.  MD suspecting etiology of abd pain on admit is multifactorial: anasarca vs gastroparesis, vs mesenteric ischemia vs uremia or all together.     Previously on TF via DHT. Now s/p GJ tube, receiving cyclic tube feeds overnight, order below. Will monitor clinical course.      Per clinical judgment, patient's nutrition risk is: At Risk     Nutrition Diagnosis:  -NC-1.4 Altered GI function related to gastroparesis as evidenced by N/V abd pain - ongoing  Moderate  PCM dx 8/5    Nutrition Intervention:  ND-2.1.1 Enteral Nutrition - Formula/solution  Access:  Dobbhoff (NJ-tube)  Formula:  Novasource Renal (2.0 cal/ml) or equivalent  Methods:  Cyclic - 48 ml/hr x15 hrs (start at 1800 and stop at 0900)  Flushes:  30 mls every 4 hrs  with tap water   Modular:  None    Tube feeding at goal rate provides 1684 kcals (100% estimated energy needs), 78 g protein (100% protein needs), 152 g carbohydrate, 0 g fiber, and 596 mls fluid not including water  flushes.     Goal: start TF - met  Pt to tolerate TF at goal rate at f/u - met    Monitoring/Evaluation:   Labs  TF  Weights    Follow up per policy.     Visit Type: Follow-up   Time Spent: 30 Minute(s)   Nutritional Risk: At Risk    VALERY JONELLE FISCHER, MS RD LDN  Clinical Dietitian [phone]

## 2024-05-31 NOTE — Progress Notes (Signed)
 Enteral Feeding Discharge Order    Patient: Raksha Wolfgang, 43 y.o. female  MRN: [id number]    Home Infusion Referral:   Order date: 05/31/2024     Enteral Feeding Prescription:     Access:  J-port of GJ-tube  Formula:  Novasource Renal (2.0 cal/ml) or equivalent  Methods:  Cyclic - 48 ml/hr x15 hrs (start at 1800 and stop at 0900)  Flushes:  30 mls every 4 hrs  with tap water ; additional flushes as needed for hydration     Tube feeding at goal rate provides 1440 kcals (100% estimated energy needs), 67 g protein (100% protein needs), 131g carbohydrate, 0 g fiber, and 511 mls fluid not including water  flushes.    Additional Orders:   Teach patient, family, and/or caregivers how to administer cyclic feeding in preparation for discharge   Home infusion to make any necessary adjustments to feeding schedule/rate/volume as needed per dietitian discretion    Patient requires enteral feeding via J-port of GJ-tube as primary source of nutrition due to gastroparesis. Patient will require enteral feeding as primary source of nutrition for at least 90 days.

## 2024-06-06 NOTE — Progress Notes (Signed)
 Nutrition Assessment Follow-Up    [facility] Melinda [name] is a 43 y.o. female.  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 Normal (BMI 18.5-24.9)  Wt Readings from Last 10 Encounters:   06/06/24 57.1 kg (125 lb 14.1 oz)   04/20/24 52.9 kg (116 lb 10 oz)   04/20/24 54.4 kg (120 lb)   04/04/24 55.2 kg (121 lb 9.6 oz)   04/02/24 56.1 kg (123 lb 10.9 oz)   03/25/24 58.3 kg (128 lb 8.5 oz)   02/14/24 57.7 kg (127 lb 1.6 oz)   01/31/24 58.7 kg (129 lb 6.6 oz)   01/31/24 58.2 kg (128 lb 4.9 oz)   01/28/24 56.4 kg (124 lb 5.4 oz)   Wt stable since admit    Labs, Meds, & Procedures:  Pertinent Labs:   Recent Labs   Lab 06/05/24  1119 06/06/24  0328   NA 134* 133*   K 3.8 3.8   CL 101 98   CO2 24 25   BUN 16 23*   CREATININE 3.5* 4.2*   CALCIUM  8.1* 7.7*   ALKPHOS 126*  --    ALT 52*  --    AST 40  --    GLUCOSE 186* 209*   Na L  BUN/Creat H   Serum Glucose 209mg /dL H     Pertinent Medications:   Scheduled:   . acetaminophen   975 mg J Tube TID   . amLODIPine   10 mg Via Tube Daily   . aspirin   81 mg Via Tube Daily   . atorvastatin   80 mg Via Tube QHS   . carvediloL   12.5 mg Via Tube BID CC   . clopidogreL   75 mg Via Tube Daily   . epoetin  alfa-epbx  10,000 Units Intravenous Dialysis   . escitalopram oxalate  15 mg Via Tube Daily   . folic acid  1 mg Via Tube Daily   . heparin  (porcine)  5,000 Units Subcutaneous Q8H SCH   . heparin  (porcine)   Intracatheter As Directed Admin   . hydrALAZINE   100 mg Via Tube TID   . hydrOXYzine  (ATARAX ,VISTARIL )  oral  10 mg Via Tube TID   . [START ON 06/07/2024] insulin  NPH-REGULAR  4 Units Subcutaneous Daily before breakfast   . insulin  NPH-REGULAR  6 Units Subcutaneous Daily before dinner   . insulin  REGULAR (HumuLIN  R, NovoLIN  R) injection  0-5 Units Subcutaneous 4x Daily   . lidocaine   2 patch Transdermal Q24H   . lidocaine -prilocaine   Topical Once per day on Monday Wednesday Friday   . LORazepam   1 mg Oral TID   . mirtazapine  15 mg Oral QHS   . omeprazole oral suspension 2 mg/mL  20 mg J Tube Daily   . polyethylene glycol  17 g J Tube Daily   . sennosides-docusate  2 tablet Oral BID   . tube feeding   Via Tube Cyclic - Night 15 hrs (1800-0900)  Infusions:       PRN: dextrose , dextrose  50% in water , glucagon , lidocaine , LORazepam , methocarbamoL, oxyCODONE , prochlorperazine , promethazine , traZODone     Pertinent Procedures: s/p GJ tube    Nutrition/Intake:  Current Nutrition:    Diet carbohydrate level 2 (60 gm/meal)  tube feeding (NOVASOURCE RENAL) liquid    Intake adequacy: Adequate via TF/PO  Food Allergies/Intolerances: NKFA    Nutrition-Focused Physical Findings:  Overall Appearance: loss of muscle mass, loss of subcutaneous fat  GI Function: Abdominal pain  Last BM Date: 06/05/24  Skin: surgical incision left arm    Estimated Nutritional Needs:   Based on  weight (56.2 kg)  Energy (kcals):  1325-1590 (25-30 kcal/kg)  Protein (g):  64-74 (1.2-1.4 g/kg)  Fluid (mls):  Per MD  _________________________________________________    Summary:  Patient presented with severe abd pain/N/V and admitted with gastroparesis diabeticorum.  Nephrology following d/t progression of kidney disease to stage 5, started on HD.  MD suspecting etiology of abd pain on admit is multifactorial: anasarca vs gastroparesis, vs mesenteric ischemia vs uremia or all together.     Previously on TF via DHT. Now s/p GJ tube, receiving cyclic tube feeds overnight, order below. Will monitor clinical course. Meeting needs via PO and TF.      Per  clinical judgment, patient's nutrition risk is: No Risk     Nutrition Diagnosis:  -NC-1.4 Altered GI function related to gastroparesis as evidenced by N/V abd pain - ongoing  Moderate PCM dx 8/5    Nutrition Intervention:  ND-2.1.1 Enteral Nutrition - Formula/solution  Access:  Dobbhoff (NJ-tube)  Formula:  Novasource Renal (2.0 cal/ml) or equivalent  Methods:  Cyclic - 48 ml/hr x15 hrs (start at 1800 and stop at 0900)  Flushes:  30 mls every 4 hrs  with tap water   Modular:  None    Tube feeding at goal rate provides 1684 kcals (100% estimated energy needs), 78 g protein (100% protein needs), 152 g carbohydrate, 0 g fiber, and 596 mls fluid not including water  flushes.     Goal: start TF - met  Pt to tolerate TF at goal rate at f/u - met    Monitoring/Evaluation:   Labs  TF  Weights    Follow up per policy.     Visit Type: Follow-up   Time Spent: 30 Minute(s)   Nutritional Risk: No Risk    VALERY JONELLE FISCHER, MS RD LDN  Clinical Dietitian [phone]

## 2024-06-08 NOTE — Progress Notes (Signed)
 Case Management Progress Note      Pending f/u with Chyrl Salter a director at [facility] [phone] in re: approval for chair. [facility] CM leadership is working to try and get support for this patient to be approved for HD outpatient chair in [place]         Suellyn Leech, MSW, [facility]  Complex Case Manager   [facility] Case Management   [phone]  06/08/2024 6:07 PM

## 2024-06-12 NOTE — Progress Notes (Signed)
 Case Management Discharge Planning Note    Barriers identified?   Adult Barriers: Primary Need: Dialysis  Dialysis Barrier: Funding  Will patient be medically ready for discharge within next 48 hours?: Yes  Next steps: f/u with medical team on final disposition and try to speak with [facility] Director again in re: outpatient chair    EDD: 06/12/2024    Preferred DC location: Home    Medical Readiness for Discharge:   Reassessment Documentation  Goals prior to discharge: CM sent a message to neph/hospitailt, and CM leadership updating that outpatient HD has not been able to be secured. The patient also can't get cleared financially for [place] or [place]  Medicaid that she had until 05/10/24. CM requested an updated from medical team, she is going back to [place]  to live with her daughter-Daisy and husband who works in [employer] .  My other question is it ever ok for the patient to present to the ED for HD. I know this is not ideal. Also I was told by the daughter Jonette that the patient does not have a local nephrology in [place] . Per notes she was seen @ [facility] Nephrology team while inpatient. Medical team will discuss this with the pateint and daughter in the am for a final decision. CM team has spoken with the pateint in re: enteral feeds & she understands that she will have to work with the infusion company for enteral feeds needs. Per the CM team lead @ [facility] can't do a [facility] for HD as she has the potiential to be approved for [place] state ([place] I have not heard back from [facility] the Interior and spatial designer of [facility] HD center @ [phone]. Daughter was made aware this am by the CM that we are not able to secure an outpt chair and medical team will f/u with her and mom in the am for final disposition  Mobility: ambulates independently  Anticipated care needs at discharge include: Dialysis  Anticipated care needs comment: outpatient HD  Preferred Discharge Location: Home    Team  recommendations and collaboration for discharge planning  New OP Dialysis    Pt / Patient representative conversation and collaboration for discharge planning  Extended Emergency Contact Information  Primary Emergency Contact: Fararoni,Daisy  Mobile Phone: [phone]  Relation: Son or Daughter  Secondary Emergency Contact: Fararoni,Gonzalo  Mobile Phone: [phone]  Relation: Spouse    Patient and/or their representative have participated in and are in agreement with discharge plan. yes    CM Next Steps:  f/u with medical team on final disposition and try to speak with [facility] Director again in re: outpatient chair    Complete Final ADT documentation  Complete CM DC Summary / Closing Note    Coordinated Resources:   No resources indicated at this time           Suellyn Leech, MSW, [facility]  Complex Case Manager   [facility] Case Management   [phone]  06/12/2024 3:52 PM

## 2024-06-13 NOTE — Progress Notes (Signed)
 Case Management Progress Note      Spoke to Melinda [name], Director at [facility] [phone]) and provided additional information .  He stated that he will discuss with his team and call tomorrow with a determination.

## 2024-06-15 NOTE — Progress Notes (Signed)
 Case Management Discharge Planning Note    Barriers identified?   Adult Barriers: Primary Need: Dialysis  Dialysis Barrier: Funding  Will patient be medically ready for discharge within next 48 hours?: Yes  Next steps: Final dispo with medical team    EDD: 06/15/2024    Preferred DC location: Home    Medical Readiness for Discharge:   Reassessment Documentation  Goals prior to discharge: Per MDR, Unable to secure OP HD/ CM leadership following.  Mobility: ambulates independently  Anticipated care needs at discharge include: Wounds  Anticipated care needs comment: OP HD  Preferred Discharge Location: Home    Team recommendations and collaboration for discharge planning  New OP Dialysis    Patient and/or their representative have participated in and are in agreement with discharge plan. Yes- Per MDRs and chart review     CM Next Steps:  Follow up with [facility] Director regarding OP HD chair.     Coordinated Resources:   Medical [place] team final dispo

## 2024-06-15 NOTE — Progress Notes (Signed)
 Nutrition Assessment Follow-Up    [facility] Melinda [name] is a 43 y.o. female.  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 Normal (BMI 18.5-24.9)  Wt Readings from Last 10 Encounters:   06/15/24 61.5 kg (135 lb 9.3 oz)   04/20/24 52.9 kg (116 lb 10 oz)   04/20/24 54.4 kg (120 lb)   04/04/24 55.2 kg (121 lb 9.6 oz)   04/02/24 56.1 kg (123 lb 10.9 oz)   03/25/24 58.3 kg (128 lb 8.5 oz)   02/14/24 57.7 kg (127 lb 1.6 oz)   01/31/24 58.7 kg (129 lb 6.6 oz)   01/31/24 58.2 kg (128 lb 4.9 oz)   01/28/24 56.4 kg (124 lb 5.4 oz)   Wt stable since admit    Labs, Meds, & Procedures:  Pertinent Labs:   Recent Labs   Lab 06/09/24  0518 06/11/24  0445 06/15/24  0614   NA 136   < > 137   K 3.7   < > 5.0   CL 101   < > 100   CO2 27   < > 25   BUN 20   < > 67*   CREATININE 2.9*   < > 3.7*   CALCIUM  8.1*   < > 8.3*   ALKPHOS 136*  --   --    ALT 41*  --   --    AST 27  --   --    GLUCOSE 226*   < > 235*    < > = values in this interval not displayed.   BUN/Creat H   Serum Glucose 235mg /dL H     Pertinent Medications:   Scheduled:   . amLODIPine   10 mg Via Tube Daily   . aspirin   81 mg Via Tube Daily   . atorvastatin   80 mg Via Tube QHS   . carvediloL   12.5 mg Via Tube BID CC   . clonazePAM   1 mg Oral QHS   . clopidogreL   75 mg Via Tube Daily   . epoetin  alfa-epbx  10,000 Units Intravenous Dialysis   . ferric gluconate (FERRLECIT) IVPB  62.5 mg Intravenous Dialysis   . folic acid  1 mg Via Tube Daily   . gabapentin  100 mg Via Tube TID   . heparin   (porcine)   Intracatheter As Directed Admin   . heparin  (porcine)   Intracatheter As Directed Admin   . heparin   2,000 Units XX Dialysis    And   . heparin   500 Units/hr XX Dialysis   . insulin  NPH-REGULAR  7 Units Subcutaneous QHS   . insulin  REGULAR (Humulin  R, NovoLIN  R) injection  3 Units Subcutaneous Daily before breakfast   . insulin  REGULAR (Humulin  R, NovoLIN  R) injection  3 Units Subcutaneous Daily before lunch   . insulin  REGULAR (Humulin  R, NovoLIN  R) injection  3 Units Subcutaneous Daily before dinner   . insulin  REGULAR (HumuLIN  R, NovoLIN  R) injection  0-4 Units Subcutaneous TID AC   . lidocaine -prilocaine   Topical Once per day on Monday Wednesday Friday   . lisinopriL   20 mg Oral Daily   . melatonin  9 mg Via Tube QHS   . omeprazole oral suspension 2 mg/mL  20 mg J Tube Daily   . polyethylene glycol  17 g J Tube Daily   . sennosides-docusate  2 tablet Via Tube BID   . tube feeding   Via Tube Cyclic - Night 15 hrs (1800-0900)     Infusions:       PRN: acetaminophen , clonazePAM , dextrose  50% in water , glucagon , hydrALAZINE , lidocaine , ondansetron     Pertinent Procedures: s/p GJ tube    Nutrition/Intake:  Current Nutrition:    Diet carbohydrate level 2 (60 gm/meal)  tube feeding (NOVASOURCE RENAL) liquid    Intake adequacy: Adequate via TF/PO  Food Allergies/Intolerances: NKFA    Nutrition-Focused Physical Findings:  Overall Appearance: loss of muscle mass, loss of subcutaneous fat  GI Function: Abdominal pain  Last BM Date: 06/13/24  Skin: surgical incision left arm    Estimated Nutritional Needs:   Based on  weight (56.2 kg)  Energy (kcals):  1325-1590 (25-30 kcal/kg)  Protein (g):  64-74 (1.2-1.4 g/kg)  Fluid (mls):  Per MD  _________________________________________________    Summary:  Patient presented with severe abd pain/N/V and admitted with gastroparesis diabeticorum.  Nephrology following d/t progression of kidney disease to stage 5, started on HD.  MD suspecting etiology of abd pain on admit  is multifactorial: anasarca vs gastroparesis, vs mesenteric ischemia vs uremia or all together.     Previously on TF via DHT. Now s/p GJ tube, receiving cyclic tube feeds overnight, order below. Will monitor clinical course. Meeting needs via PO and TF. J tube was clogged, now resolved, recommend extensive flushing with medications and proper flushing with feeds.     Per clinical judgment, patient's nutrition risk is: No Risk     Nutrition Diagnosis:  -NC-1.4 Altered GI function related to gastroparesis as evidenced by N/V abd pain - ongoing  Moderate PCM dx 8/5    Nutrition Intervention:  ND-2.1.1 Enteral Nutrition - Formula/solution  Access:  Dobbhoff (NJ-tube)  Formula:  Novasource Renal (2.0 cal/ml) or equivalent  Methods:  Cyclic - 48 ml/hr x15 hrs (start at 1800 and stop at 0900)  Flushes:  30 mls every 4 hrs  with tap water   Modular:  None    Tube feeding at goal rate provides 1684 kcals (100% estimated energy needs), 78 g protein (100% protein needs), 152 g carbohydrate, 0 g fiber, and 596 mls fluid not including water  flushes.     Goal: start TF - met  Pt to tolerate TF at goal rate at f/u - met    Monitoring/Evaluation:   Labs  TF  Weights    Follow up per policy.     Visit Type: Follow-up   Time Spent: 30 Minute(s)   Nutritional Risk: No Risk    Melinda JONELLE FISCHER, MS RD LDN  Clinical Dietitian [phone]

## 2024-06-19 NOTE — Progress Notes (Signed)
 Case Management Discharge Planning Note    Barriers identified?   None    EDD: 06/22/2024    Preferred DC location: Home    Medical Readiness for Discharge:   Reassessment Documentation  Goals prior to discharge: Per email with [facility] and CM Director Amy, CM assisted pt at bedside with [facility] Ins[employer] form and faxed to [facility]. [facility] emailed over a contract for self-pay for $360/session for OP HD for pt. Pt expressed wanting to DC as soon as possible and present to ED for HD sessions. Dr. Marylene explained to pt that if she presented to [facility] (who doesn't have OP HD) then pt would have to be admitted each time. CM spoke with Megan with [employer] from previous discussions about OOP cost for eteral feeds as pt is on a continuous pump. Per [place], they do not rent pumps and pt would have to purchase in full as well as [facility] will require pt to purchase formula from them as well totaling a cost on $2500/month. CM left message for Leita at [employer] to inquire about pump rental costs. CM previously discussed with Jonette the ability to purchase formula at [employer] or [address]. Back up plan to coordinate with treatment team about ability to switch pt to bolus feeds.  Mobility: ambulates independently  Anticipated care needs at discharge include: Wounds, Tube Feeds/TPN  Anticipated care needs comment: OPHD, Pump TF  Preferred Discharge Location: Home    Team recommendations and collaboration for discharge planning  New OP Dialysis, Feeding Therapy    Patient and/or their representative have participated in and are in agreement with discharge plan. no    CM Next Steps:    []  F/U with pt about [facility]'s presentation of OOP cost contract of $360/session   []  F/U with provider about sustainability of ED visits for HD treatments   []  F/U with [facility] for pump rental for TF   []  F/U with treatment team about possible switch to bolus feeds    Coordinated Resources:   No resources indicated at this time

## 2024-06-20 NOTE — Progress Notes (Signed)
 Case Management Progress Note      CM called patient's daughter Pearly Dustman @ [phone] in re: getting a # & POC for [place]  Medicaid office as CM wanted to inquire to see if she can be approved again as the daughter reports nothing in re: patient's husband income has changed from pt being approved for EMS Medicaid with [place] .     HD:   Patient's daughter reports they can't afford the contract for self-pay for $360/per session x3 weekly for OP HD.  -Currently no HD chair has been secured     Nutrition:   []  F/U with [employer] for pump rental for TF   []  F/U with treatment team about possible switch to bolus feeds      Suellyn Leech, MSW, [facility]  Complex Case Manager   [facility] Case Management   [phone]  06/20/2024 3:41 PM

## 2024-06-21 NOTE — Progress Notes (Signed)
 Case Management Progress Note      CM received a call from patient's daughter Jonette with a POC for [place] DSS @ [phone] Valeria who directed me to the below name as the new POC for [place] DSS    Harlene Kerns Benefits Program Specialist II  [email or web address]  [place] [employer] of Social Services   [address] [place] [address]  [address] [place] [address]    [phone]  & [phone]     CM spoke with Glade Kerns via phone who reports a Application was submitted via the [facility] on 06/04/24 & will take 45 days to process. Glade reported they are waiting on income verification and house hold size. CM sent a message to [facility] to update and send this information of the patient's husband's pay stubs was emailed to [email or web address] to continue to process this application.     Jonette Saupe 43 year old/   email: [email or web address] & contact # [phone]   Husband Income Stubs -emailed to [place]  DSS on 06/21/24  Birder Carrier -husband   Orene Saupe -70 year old   Hadassah Saupe -42 year old   Birder -husband work full time & the 20 yo is now the cg for the mother     CM Next Steps & f/u   -Per Nephrology team is reaching out to nephrologists at [facility] to see if they will accept her as charity case. Pending f/u     -hosptialist - Dr. Umberto is will f/u with nutrition in e: final nutrition place as she will need a plan that is economically affordable as she has to pay for her enteral feeds and supplies.   -f/u with Glade Kerns to see if patient can get re-instated for her EMS-Emergency Medicaid in hopes to be approved for an outpatient HD chair in [place].     -Her Nettie  Address is: [address]  [place], [place] [address]       Patient is ready to d/c and leave but understands that we have not been able to secure a HD outpatient chair for her in [place]  or [place] with [facility].       Suellyn Leech, MSW,  LCSWA  Complex Case Manager   [facility] Case Management   [phone]  06/21/2024 4:35 PM

## 2024-06-22 NOTE — Progress Notes (Addendum)
 Case Management Progress Note      The cost for the enteral continue feeds is with pump & pole with [facility] is $2500.00. Patient's daughter reports they can't cover this cost.     CM called Leita with [employer] and cost for a feeding pump and pole.    Medical team is looking at what will be the  best to focus on optimizing PO nutrition for her sake and would simplify discharge.     Like will d/c and f/u with Hospital ED for outpatient HD tx as she is going back to [place] to live.     On 06/21/24-it has been a collaborative effort to secure an HD chair but no success.     CM emailed patient's daughter the POC person I spoke with on yesterday in re: [place]  Medicaid   [email or web address]    Patient will d/c with family back to [place] . Team has agreed for her to take the Tube feeds supplies abut she is optimizing her PO intake.     Suellyn Leech, MSW, LCSWA  Complex Case Manager   [facility] Case Management   [phone]  06/22/2024 3:09 PM

## 2024-06-23 NOTE — Discharge Summary (Signed)
 Melinda [name]  Medicine Discharge Summary    Admit Date: 05/01/2024  Discharge Date: 06/23/2024    Admitting Physician: Gladis Elene Raddle., MD  Discharge Physician: Narayanrao, Vijayarajan, MD    Primary Care Provider: Provider, Phone None    Discharge Destination: Home    Admission Diagnoses:   Shortness of breath [R06.02]  Hypertension, uncontrolled [I10]  Congestive heart failure, unspecified HF chronicity, unspecified heart failure type (CMS/HHS-HCC) [I50.9]  Nausea and vomiting, unspecified vomiting type [R11.2]    Discharge Diagnoses:   Principal Problem:    Gastroparesis diabeticorum  (CMS/HHS-HCC)  Active Problems:    Diabetes mellitus type 2 with complications (CMS/HHS-HCC)    Intractable nausea and vomiting    Essential hypertension    CKD (chronic kidney disease) stage 5, GFR less than 15 ml/min (CMS/HHS-HCC)    Hypertensive emergency    Hypertension, uncontrolled  Resolved Problems:    * No resolved hospital problems. *   Primary Diagnosis: Admitted for intractable N/V 2 to diabetic gastroparesis, needing G tube. PO intake improved now, stopping TF for now.      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 We were unable to find any HD chair for her in [place] in over 50 days.  Main barrier for discharge is getting HD chair due to insurance. Pt is undocumented, only has [place]  emergency medicaid.     Anxeity/Depression/ Akathesia, and Polypharmacy.     Changes Made (with rationale):   stopped lexapro, remeron, ativan , reglan , phenergan , robaxin, trazodone .       Insulin  Regimen when NOT TAKING TUBE FEEDS : NPH-Regular 70/30: 5 units in the morning with breakfast   NPH-Regular 70/30: 3 units in the evening with dinner (supper)   Insulin  Regimen when TAKING TUBE FEEDS  NPH-Regular 70/30: 5 units in the morning with breakfast   NPH-Regular 70/30: 10 units in the evening with dinner (supper) only use this higher dose when taking tube feeds as prescribed, can increase by 2 units every 2 days to  max dose of 16 units if morning blood sugar in the morning is above 200      Klonopin  0.5 mg HS started.   Lisinopril  20 mg daily new   FeSO4 daily.     To-Do List (incidental findings, follow-up studies, etc.):  Monitor Sugar and BP trend and adjust medication.     Anticipatory Guidance for Outpatient Care:   f/u with Hospital ED for outpatient HD tx as she is going back to [place] to live.        Results Pending at Discharge:   None  Please see phone numbers at end of this summary for lab contact information.     Follow-up/Care Transition Plan:  Sched. appts: Future Appointments   Date Time Provider Department Center   03/06/2025  1:40 PM Coppolino, Lauraine Lacks, PA [facility]      Follow-up info: Provider    Follow up in 1 week(s)            Allergies/Intolerances:   No Known Allergies     New Adverse Drug Events: none    Medications:       Current Discharge Medication List        START taking these medications        Instructions   BAQSIMI  3 mg/actuation nasal spray  Quantity: 1 each  Refills: 3  Stop taking on: June 23, 2024  Generic drug: glucagon    Place 1 spray (3 mg total) into  one nostril once as needed for up to 1 dose            ASK your doctor about these medications        Instructions   acetaminophen  325 MG tablet  Refills: 0  Stop taking on: May 02, 2024  Ask about: Should I take this medication?   Commonly known as: TYLENOL   Take 3 tablets (975 mg total) by mouth every 4 (four) hours as needed for up to 10 days     amLODIPine  10 MG tablet  Quantity: 30 tablet  Refills: 2   Commonly known as: NORVASC   Take 1 tablet (10 mg total) by mouth once daily for 90 days  Last time this was given: 10 mg on June 22, 2024  7:50 AM     aspirin  81 MG chewable tablet  Quantity: 30 tablet  Refills: 2   Take 1 tablet (81 mg total) by mouth once daily for 90 days  Last time this was given: 81 mg on June 23, 2024  8:08 AM     atorvastatin  80 MG tablet  Quantity: 30 tablet  Refills: 0   Commonly  known as: LIPITOR   Take 1 tablet (80 mg total) by mouth at bedtime for 30 days  Last time this was given: 80 mg on June 22, 2024  8:51 PM     busPIRone  5 MG tablet  Refills: 0   Commonly known as: BUSPAR   Take 5 mg by mouth 2 (two) times daily     carvediloL  12.5 MG tablet  Quantity: 360 tablet  Refills: 0   Commonly known as: COREG   Take 1 tablet (12.5 mg total) by mouth 2 (two) times daily with meals  Last time this was given: 12.5 mg on June 23, 2024  8:08 AM     clopidogreL  75 mg tablet  Quantity: 30 tablet  Refills: 2   Commonly known as: PLAVIX   Take 1 tablet (75 mg total) by mouth once daily for 90 days  Last time this was given: 75 mg on June 23, 2024  8:08 AM     famotidine  20 MG tablet  Refills: 0   Commonly known as: PEPCID   Take 20 mg by mouth every 12 (twelve) hours     hydrALAZINE  50 MG tablet  Quantity: 90 tablet  Refills: 1   Commonly known as: APRESOLINE   Take 1 tablet (50 mg total) by mouth 3 (three) times daily for 120 days     insulin  NPH-REGULAR 100 unit/mL (70-30) injection  Refills: 0   Commonly known as: HumuLIN  70/30  Inject 8 Units subcutaneously every morning before breakfast AND 8 Units Daily before dinner. Do all this for 108 days. And inject 4 units every evening before dinner..  Last time this was given: 10 Units on June 21, 2024  9:07 PM     metoclopramide  10 MG tablet  Refills: 0   Commonly known as: REGLAN   Take 10 mg by mouth 3 (three) times a day     polyethylene glycol packet  Refills: 0   Commonly known as: MIRALAX   Take 1 packet (17 g total) by mouth once daily Mix in 4-8ounces of fluid prior to taking.  Last time this was given: 17 g on June 20, 2024  8:30 AM     scopolamine  1 mg over 3 days patch  Refills: 0   Commonly known as: TRANSDERM-SCOP  APPLY ONE PATCH TOPICALLY BEHIND ONE EAR AND CHANGE EVERY 72 HOURS AS  DIRECTED               Brief History of Present Illness:   43 y.o. Spanish speaking female admitted on 05/01/2024.  She has a history of DM2  c/b retinopathy, neuropathy who presented with nausea, vomiting and abdominal pain secondary to gastroparesis.  Patient has had multiple admissions for N/V, HTN, PAD. Hospitalization protracted by abdominal pain after meals.  DHT placed with strict NPO order on 8/5.  Plan was to trial post pyloric feeds with absolutely no oral intake.  Patient was tolerating tube feeds, but would develop abdominal pain when she would choose to take PO.  Pain management was consulted and recommended no opiates and for patient to avoid PO intake. Patient has been weaned off of opiates and that has been effective. Patient requested PO intake on 8/12 therefore dobhoff tube was removed (08/05-08/12). As expected, patient developed abdominal pain, N/V with oral intake.  GJ tube placed 05/25/2024. Patient received tube feeds via GJ tube and has been receiving cycled tube feeds at night. However patient is non-adherent to NPO orders and is seen eating reasonable amounts of PO meals w/o consequences. Mutually decided to stop tube feeds. Regarding ESRD, Left arm AVG placed on 07/29. HD meanwhile started via permcath as AVG cannot be used for 3 weeks (08/19). HD have been uneventful. Our complex case manager worked on HD chair, unfortunately patient only had emergency medicaid through [place] till 05/10/24 which has ended. She can again have emergency medicaid through [place]  and it was re applied on 08/25 per patient and family's wishes. The  case management/nephrology discussed and patient will need to go to ED for HD, which she can do in [place] as well. Patient originally moved to [place]  where only her brother lives. She has multiple family members in [place]  and [place] [employer] help in [place] . She also worries about deportation and wants to go to [place]  to sort that out personally.   On 8/29, pt was noticed to be restless and anxious. Seen by psych, patient appeared to have akathisia. Medications adjusted as per below. She also had  clogged J tube which was unclogged by multiple times (3 per my chart review). Orders are to use the tube only for feeds not for pills. Patient has been educated to use the tube however as of 09/12 she is off of tube feeds. She will however be give 12 days work of tube feeds to take with her. CM supporting medication and equipment cost if possible. Endocrinology will give final recs on insulin  after she is off of tube feeds. Patient can afford 70/30 insulin .   _____________________     Hospital Course by Problem:  # Intractable Nausea and Vomiting secondary to diabetic gastroparesis:  # Diffuse abdominal pain:  # Non-adherence to NPO status and tolerationg PO diet very well as noticed by patient and staff   - Reglan  and erythromycin  have not been effective.  DHT placed 8/5 and then removed on 8/12 on patient request for oral intake.  However, based on clinical history, need for GJ tube seems inevitable.  - Pt symptoms improved after GJ tube placement.     - Currently pt receive nocturnal tube feeding and daytime oral diet with good tolerance. Will continue J tube feeds from 6 pm to 9 am and also continue carb diet.  Patient is refusing to be NPO.  - Giving medications via G tube only  - Patient will not be prescribed opiates on discharge.  Etiology  of patient's pain is when she eats and she will need to modify her lifestyle to prevent pain.  J tube feeds running to meet patient's minimal caloric needs.  Should she decide to stop eating entirely, RD has stated that they could re-evaluate and increase calories from tube feeds.  - Requested nursing to provide education on tube feeding when daughters are at bedside.  - 8/21, Dietician confirmed that Tube feeding at goal rate provides 1684 kcals (100% estimated energy needs), 78 g protein (100% protein needs), 152 g carbohydrate, 0 g fiber, and 596 mls fluid not including water  flushes.   - 9/4 morning, J tube clogged, IR de-clogged the tube, same thing happened again IR  help to declot.    - 09/12 repeat of above>. RN advised to flush more often manually and to not use the tube for pills. I have changed all pill orders to PO in addition to pre existing orders that indicated not to use the tube for pills.   - PO intake is getting optimized and may eventually may not need G tube feeding.     # Akathisia vs RLS- resolved  # Depression and anxiety  Patient is noticed to be restless, pacing, rocking, nervous, anxious despite being on multiple meds  -seen by psych 8/29, pt likely has akathisia vs RLS  -on 8/29, stopped lexapro, remeron, ativan , reglan , phenergan , robaxin, trazodone . Switched hydralazine  to lisinopril   -8/29, started gabapentin 100mg  tid, melatonin 9mg  QHS, klonopin  1mg  QHS, Klonopin  0,5mg  tid prn. Continue to avoid agents that were discontinued (antiemetics (zofran  is okay), reglan , antipsychotics, SSRIs/SNRIs, remeron. I have Dc melatonin and reduced Klonipin to 0.5 in an effort to reduce pill burden and patient states she has no issues with sleep or anxiety anymore   -does not appear to have iron  deficiency  -d/c oxycodone  8/31> improved         # Poorly controlled hypertension, likely exacerbated with anxiety  - Pt is found to have significantly elevated BP, up to 180s/90s.   - has been on Amlodipine  10 mg daily, Carvedilol  12.5 mg BID, Hydralazine  100 mg TID   - on 8/29, switched hydralazine  to lisinopril . BP improved with calmer mood. 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  Started on HD this hospitalization  - Improving with HD sessions.  - AVG working well>> permcath removed on 09/10  - Nephro following>> Hd on 09/13   - As per Dr.Punj/Renal note today : Patient reports that [facility] is requesting physician to physician contact. Number is [phone].  Discussing with case management. I will try to reach out directly to [facility] nephrologists Monday.      Medicaid application was applied on 08/25. CM will email all details to the daughter. Daughter is very responsive over the phone and email and is fluent in [place]      # DM2  HbA1c 7.3 on 04/20/24  - insulin  regimen being adjusted by endo       # Mildly elevated ALT and ALP>   - Latest 27/41 on 08/30    Will continue Statin         # Malnutrition  # Hypoalbuminemia  # Pseudo-hypocalcemia   - Pt developed malnutrition due to her DM and gastroparesis, for which she had very low oral intake due to frequent nausea, vomiting and abdominal  pain.   - Albumin  was 2.1. Ca 7.9, which was pseudo hypoCa due to low albumin   - Dietician consulted and suggested pt has inadequate via PO  - Therefore, will continue tube feeding for now.   - 8/21, Dietician confirmed that Tube feeding at goal rate provides 1684 kcals (100% estimated energy needs), 78 g protein (100% protein needs), 152 g carbohydrate, 0 g fiber, and 596 mls fluid not including water  flushes.   - Patient PO intake has improved. She devours the food her daughter brings. The family is smart to bring her broths and non startchy foods . TF have been DC. J tube to stay      # Back pain  - Her pain mainly was paraspinal, likely due to immobility and lying in bed too long  - improved   Off of gabapentin       # Anemia of chronic disease:  - Epoetin . Nephrology also treated with IV iron       # Dyslipidemia:  - Lipitor      # PAD w/ SMA stenosis s/p stent  - ASA, statin, plavix        Abnormal troponin: She had an abnormal high-sensitivity troponin panel. Based on my review of all clinical and laboratory information, the abnormal troponin most likely reflects myocardial injury secondary to another medical condition (ICD-10 code I5A).         Surgeries and Procedures Performed:     Procedure(s):  left arm hemodialysis graft  _____________________    Discharge Exam:   BP (!) 168/78 (BP Location: Right upper arm, Patient Position: Lying)   Pulse 78   Temp 36.4 C (97.6 F)  (Temporal)   Resp 18   Ht 149.9 cm (4' 11)   Wt 61.6 kg (135 lb 12.9 oz)   LMP  (LMP Unknown)   SpO2 94%   BMI 27.43 kg/m   O2 Device: Nasal cannula (05/26/24 1126)  Physical Exam:  BP (!) 168/78 (BP Location: Right upper arm, Patient Position: Lying)   Pulse 78   Temp 36.4 C (97.6 F) (Temporal)   Resp 18   Ht 149.9 cm (4' 11)   Wt 61.6 kg (135 lb 12.9 oz)   LMP  (LMP Unknown)   SpO2 94%   BMI 27.43 kg/m   Constitutional: age appropriate, well developed, well appearing, ocular strabismus  HEENT: NC/AT, PERRL, OP-moist without lesion  Neck: supple without lymphadenopathy  Cardiovascular: S1, S2  Respiratory: Lungs clear to auscultation bilaterally. No rales, wheezes, or rhonchi. No respiratory distress.  Abdomen: Soft, nontenderness, BS present.  Extremities: Rt UE AVG     Pertinent Lab Testing:  Recent Labs   Lab 06/18/24  0434 06/20/24  0526 06/22/24  0426   NA 136 138 137   K 4.8 4.2 4.7   CL 103 101 102   CO2 22 24 24    BUN 75* 65* 55*   CREATININE 4.4* 4.2* 4.0*   GLUCOSE 153* 201* 101   CALCIUM  8.2* 8.4* 8.7     No results for input(s): AST, ALT, ALKPHOS, TBILI in the last 168 hours.  Recent Labs   Lab 06/18/24  0434   WBC 7.8   HGB 8.8*   HCT 27.6*   PLT 301     No results for input(s): APTT, INR in the last 168 hours.     Other Pertinent Labs:       Micro:   No results found for: BLDCULT   Lab Results   Component  Value Date    URCULT >=100,000 CFU/mL Streptococcus agalactiae, group B (!) 03/10/2024    URCULT  03/10/2024     10,000-50,000 CFU/mL Non-beta hemolytic Gram positive Organism      No results found for: GRAMSPU, CULTRESP   No results found for this visit on 05/01/24.  Lab Results   Component Value Date    SARSCOV2 Not Detected 05/24/2024    SARSCOV2 Not Detected 02/14/2024          Pertinent Imaging:     IR central venous catheter removal  Result Date: 06/20/2024  EXAM: Tunneled central venous catheter removal INDICATION: AVG matured and functioning CKD (chronic  kidney disease) stage 5, GFR less than 15 ml/min (CMS/HHS-HCC) [W81.4 (ICD-10-CM)] SEDATION / ANESTHESIA / MEDICATION: Level of sedation/anesthesia: Local Anesthesia Only Sedation / Anesthesia / Medication Administered:  Totals unavailable because the procedure time range is not set FLUOROSCOPY DOSE: Total fluoroscopy time: 0 minutes Dose area product: 0 mGy-cm2 Air kerma: 0 mGy COMPLICATIONS: None immediate PREPROCEDURE: Informed written consent was obtained after a discussion of the risks, benefits, and alternatives to the procedure.  A Time-Out was performed immediately prior to the procedure. TECHNIQUE AND FINDINGS: Bedside procedure. The catheter site was prepped and draped in the usual sterile fashion.  Local anesthesia was administered. The tunneled catheter was dissected free of the soft tissues and the catheter was removed intact. Hemostasis was obtained with pressure. The access site was cleaned and dressed. The patient tolerated the procedure well.         Successful removal of a tunneled central venous catheter. I, KAREN CARLEAN CORNWALL, NP, attest that I personally performed the procedure.     IR GJ tube exchange  Result Date: 06/14/2024  EXAM: IR GJ tube evaluation/declogging INDICATION: RN reports J is clogged Gastroparesis diabeticorum  (CMS/HHS-HCC) [E11.43, K31.84 (ICD-10-CM)] SEDATION / ANESTHESIA / MEDICATION: Level of sedation/anesthesia: No sedation Sedation / Anesthesia / Medication Administered:  iopamidoL  (ISOVUE -300) 300 mg iodine /mL (61 %) injection - 20 mL (Totals for administrations occurring from 0923 to 0953 on 06/14/24) FLUOROSCOPY DOSE: Total fluoroscopy time: 3.0 minutes Dose area product: 7808 mGy-cm2 Air kerma: 26.29 mGy COMPLICATIONS: None immediate PREPROCEDURE: Informed written consent was obtained after a discussion of the risks, benefits, and alternatives to the procedure.  A Time-Out was performed immediately prior to the procedure. TECHNIQUE AND FINDINGS: The indwelling  gastrojejunostomy tube was in place with patent gastric portion and clogged jejunostomy lumen. After forceful injection through the jejunostomy port with saline as well as manipulation through the jejunostomy port with a wire the port was able to be unclogged. Contrast injection demonstrated appropriate position of gastrojejunostomy which was functioning and flushing easily after unclogging.        Successful fluoroscopic evaluation and unclogging of gastrojejunostomy tube. I, RACHEL ANN BRADER, MD, attest that I personally performed the procedure.     IR GJ tube placement  Result Date: 05/25/2024  EXAM: Gastrojejunostomy tube placement INDICATION: gastroparesis Gastroparesis diabeticorum  (CMS/HHS-HCC) [Z88.56, K31.84 (ICD-10-CM)] SEDATION / ANESTHESIA / MEDICATION: Level of sedation/anesthesia: Moderate sedation  - the physician who performed the diagnostic/therapeutic procedure provided 37 minutes of continuous face to face moderate sedation services for this procedure with the assistance of an independent trained observer who had no other duties during the procedure.  Sedation / Anesthesia / Medication Administered:  fentaNYL  (PF) (SUBLIMAZE ) injection - 150 mcg hydrALAZINE  (APRESOLINE ) injection 10 mg - Cannot be calculated*  *Administration dose not documented lidocaine  (SALONPAS) 4 % patch 2 patch -  Cannot be calculated*  *Administration dose not documented lidocaine  (XYLOCAINE ) 1 % injection 0.5 mL - Cannot be calculated*  *Administration dose not documented lidocaine  (XYLOCAINE ) 1 % injection - 18 mL heparin  (porcine) 1,000 unit/mL injection - Cannot be calculated*  *Administration dose not documented heparin  injection 1,000 Units - Cannot be calculated*  *Administration dose not documented heparin  1,000 unit/mL injection - Not documented*  *Total volume has not been documented. View each administration to see the amount administered. promethazine  in 0.9 % NaCl (PHENERGAN ) 6.25 mg/50 mL IVPB 6.25 mg - Not  documented*  *Total volume has not been documented. View each administration to see the amount administered. ceFAZolin (ANCEF) 2 g in sodium chloride  0.9 % 100 mL IVPB - Not documented*  *Total volume has not been documented. View each administration to see the amount administered. midazolam  (VERSED ) 1 mg/mL injection - 2 mg prochlorperazine  (COMPAZINE ) injection 10 mg - Cannot be calculated*  *Administration dose not documented heparin  (porcine) (PF) vial 5,000 Units - Cannot be calculated*  *Administration dose not documented epoetin  alfa-epbx (RETACRIT ) injection 6,000 Units - Cannot be calculated*  *Administration dose not documented ferric gluconate (FERRLECIT) 125 mg in sodium chloride  0.9% 110 mL IVPB - Not documented*  *Total volume has not been documented. View each administration to see the amount administered. escitalopram oxalate (LEXAPRO) tablet 10 mg - Cannot be calculated*  *Administration dose not documented insulin  REGULAR (HumuLIN  R) injection correction dose 0-4 Units - Cannot be calculated*  *Administration dose not documented pantoprazole  (PROTONIX ) injection 40 mg - Cannot be calculated*  *Administration dose not documented iopamidoL  (ISOVUE -300) 300 mg iodine /mL (61 %) injection - 30 mL (Totals for administrations occurring from 0954 to 1047 on 05/25/24) FLUOROSCOPY DOSE: Total fluoroscopy time: 10.3 minutes Dose area product: 76809 mGy-cm2 Air kerma: 90.35 mGy COMPLICATIONS: None immediate PREPROCEDURE: Informed written consent was obtained after a discussion of the risks, benefits, and alternatives to the procedure. A pre-procedure assessment was documented in the medical record. A Time-Out was performed immediately prior to the procedure. TECHNIQUE AND FINDINGS: The patient was positioned supine on the fluoroscopy table. The abdomen was prepped and draped in the usual sterile fashion. After insufflation of the stomach with air, an appropriate access site was chosen using fluoroscopy.   Local anesthesia was administered.  T-fasteners were deployed under fluoroscopic guidance to pexy the stomach to the anterior abdominal wall.  A small incision was made between the T-fasteners and an introducer needle was inserted, allowing a guidewire to be advanced to the proximal small bowel.  The tract was dilated.  The gastrojejunostomy tube was advanced over the wire. The balloon was inflated to 10 mL and pulled back to the gastrostomy. The external retention disc was cinched down to provide a snug gastropexy. Fluoroscopy was used to confirm positioning of the gastrostomy lumen within the stomach, and the jejunostomy lumen within the proximal small bowel. The patient tolerated the procedure well.      1. Successful placement of a balloon-retained 18 French gastrojejunostomy tube. 2. The patient should remain NPO and maintain gastrostomy port to low-intermittent wall suction until tomorrow morning, at which time the gastrostomy port can be used. 3. The jejunostomy port may be used immediately, for feeds and flushes only. PLAN: If T-fasteners are still present after 14 days, nursing staff may remove.  Please contact IR with any questions or for assistance. I, RACHEL ANN BRADER, MD, attest that I personally performed the procedure.     X-ray abdomen flat and upright portable  Result Date: 05/25/2024  XR ABDOMEN FLAT AND UPRIGHT PORTABLE Comparison:  None History:  G tube pre procedure xray, S98.181 Encounter for other preprocedural examination. Findings: No free intraperitoneal gas definitely seen. Gas within nondilated loops of small and large bowel without definite obstruction. Oral contrast seen throughout the large bowel, and to a lesser degree small bowel. Some residual contrast in the gastric fundus. Electronically Signed by:  Lamar JAYSON Lund, MD, [facility] Electronically Signed on:  05/25/2024 8:45 AM    X-ray abdomen 1 view  Result Date: 05/18/2024  AP PORTABLE ABDOMINAL RADIOGRAPHS CLINICAL HISTORY:  diffuse abd pain, R11.2 Nausea with vomiting, unspecified, R10.84 Generalized abdominal pain COMPARISON: Radiograph May 15, 2024 and May 09, 2024, CT May 01, 2024 TECHNIQUE: 2 radiographs were submitted at the time of review. Images were obtained in the supine position. FINDINGS: Enteric feeding tube tip is now directed more inferiorly at the expected location of the descending duodenum. There is some redundancy of the tubing more proximally within the stomach. No dilated bowel loops within the imaged field-of-view. Bones are unchanged. Visualized lung bases are clear. IMPRESSION:    Enteric feeding tube is now more advanced and directed more antegrade within the lumen of the descending duodenum. No bowel obstruction or ileus. Electronically Signed by:  Beverley Norfolk, MD, [facility] Electronically Signed on:  05/18/2024 2:32 PM    X-ray long GI tube placement  Result Date: 05/15/2024  Exam: XR LONG GI TUBE PLACEMENT Indication: Severe gastroparesis, R11.2 Nausea with vomiting, unspecified - Findings/technique: Enteric feeding tube placed under fluoroscopic guidance. The patient's right nostril was anesthetized with viscous lidocaine . An 8 [place] feeding tube was inserted into the nostril and under fluoroscopic guidance advanced until the tip of the feeding tube reached the duodenal bulb. Water -soluble contrast was injected through the tube to confirm placement, and the tube was then flushed with normal saline. The tube was secure to the bridge of the nose. The patient tolerated the procedure without difficulty. Conclusions: Successful placement of enteric feeding tube with tip located in the duodenal bulb. Electronically Signed by:  Mercer Lehmann, MD, [facility] Electronically Signed on:  05/15/2024 12:48 PM    X-ray abdomen AP portable  Result Date: 05/09/2024  XR ABDOMEN AP PORTABLE Abdomen supine Comparison:  Previous History:  Intractable N/V. R/o SBO, ileus, severe constipation, R11.2 Nausea with  vomiting, unspecified. Findings: Gas within nondilated loops of small and large bowel without definite obstruction.  Scattered bowel gas. Overlying cardiac leads. Lung bases clear. 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  Sedation / Anesthesia / Medication Administered:  fentaNYL  (PF) (SUBLIMAZE ) injection - 100 mcg ceFAZolin (ANCEF) 2 g in sodium chloride  0.9 % 100 mL IVPB midazolam  (VERSED ) 1 mg/mL injection - 1.5 mg lidocaine -EPINEPHrine (PF) 1 %-1:200,000 injection - 12 mL FLUOROSCOPY DOSE: Total fluoroscopy time: 1.12 minutes Dose area product: 204 mGy-cm2 Air kerma: 8 mGy COMPLICATIONS: None immediate PREPROCEDURE: Informed written consent was obtained after a  discussion of the risks, benefits, and alternatives to the procedure. A pre-procedure assessment was documented in the medical record. A Time-Out was performed immediately prior to the procedure. TECHNIQUE AND FINDINGS: The patient was positioned supine on the fluoroscopy table. The procedure site was prepped and draped in the usual sterile fashion. The target vein was visualized by ultrasound and found to be patent. An image was saved and sent to PACS. After anesthetizing the overlying skin and subcutaneous tissues with lidocaine , the vein was accessed under real-time ultrasound guidance using a micropuncture kit. A  guidewire was advanced into the central veins under fluoroscopic guidance. A subcutaneous tract was anesthetized with lidocaine , and using a tunneling device the catheter was tunneled through the subcutaneous tissues and out the venotomy site. A peel-away sheath was advanced over the guidewire. The catheter was inserted through the peel-away sheath. A radiograph confirmed appropriate location of the catheter tip at the cavoatrial junction / right atrium. The catheter aspirated and flushed without difficulty. The catheter was anchored to the skin surface using a non-absorbable suture. The venotomy site was closed with skin glue.  The patient tolerated the procedure well.      Successful placement of a right internal jugular vein 19 cm tip to cuff 14.5 [place] [facility] hemodialysis tunneled central venous catheter. The catheter is ready for use. I, MATTHEW OMAR SELTZER, MD, attest that I personally performed the procedure.     US  renal complete  Result Date: 05/02/2024  EXAM: US  Renal INDICATION: suspected kidney stone given her bloody urine and bilateral CVA tenderness, R31.0 Gross hematuria COMPARISON: CT 05/01/2024, US  03/20/2024 TECHNIQUE: 2-D grayscale and color Doppler ultrasound of the kidneys was performed. FINDINGS: Right kidney: Measures 10.6 cm. Increased echogenicity. No renal stones. No hydronephrosis. Left kidney: Measures 9.4 cm. Increased echogenicity. No renal stones. No hydronephrosis.   Bladder: Normal. Bilateral pleural effusions as noted on recent CT. IMPRESSION: 1.  No hydronephrosis. No definite renal calculi. 2.  Increased echogenicity of the bilateral kidneys as can be seen in setting of medical renal disease. Electronically Signed by:  Shyrl Admire, MD, [facility] Electronically Signed on:  05/02/2024 1:53 PM    CT abdomen pelvis without contrast  Result Date: 05/01/2024  CT abdomen and pelvis without IV contrast Comparison: CT April 20, 2024. Indication: Abdominal pain, acute,  nonlocalized, R11.2 Nausea with vomiting, unspecified. Technique:  CT imaging of the abdomen and pelvis was performed without intravenous contrast. Coronal and sagittal reformatted images were generated and reviewed. Findings: Limited assessment given the lack of IV contrast, particularly of the solid organs, bowel, and vasculature. - Lower Thorax: Scattered smooth intralobular septal thickening Moderate right and small left pleural effusions. Small pericardial effusion. - Liver: Unremarkable noncontrast appearance. - Biliary and Gallbladder: No intrahepatic or extrahepatic bile duct dilatation. The gallbladder is surgically absent. - Spleen: Normal in size.  - Pancreas: Unremarkable noncontrast appearance. - Adrenal Glands: Normal. - Kidneys: The kidneys are symmetric in size. No renal stones. No hydronephrosis. - Abdominal and Pelvic Vasculature: No abdominal aortic aneurysm. Moderate atherosclerotic plaque is present. Proximal celiac artery stent. - Gastrointestinal Tract: No bowel obstruction.  Nondilated appendix containing intraluminal barium concretions. - Peritoneum/Mesentery/Retroperitoneum: No free fluid.  No free intraperitoneal air. - Lymph Nodes: No retroperitoneal, mesenteric, or pelvic lymphadenopathy.  - Bladder: Normal in appearance. - Pelvic Organs: The uterus is present. - Body Wall: Unremarkable. - Musculoskeletal:  No aggressive appearing osseous lesions. Impression: 1. New moderate right and small left pleural effusions with small pericardial effusion  and partially imaged pulmonary edema. 2. No definite acute finding seen below the diaphragm within the constraints of noncontrast imaging. Electronically Signed by:  Beverley Norfolk, MD, [facility] Electronically Signed on:  05/01/2024 8:18 PM   _____________________    Code Status: Full Code  Goals of care were not addressed during this admission.     Status on Discharge:   Current activity: Walks frequently (06/22/24 2153)  Current mobility: No  limitation (06/22/24 2153)    Activity Recommendation: activity as tolerated    Other Discharge Instructions:  Services setup at discharge: None  Tubes/lines at discharge: None   Diet: Diet carbohydrate level 2 (60 gm/meal); L4 Pureed; L0 Thin Liquids    Wound Care Order Instructions       None           _____________________    Time spent on discharge process: 45 minutes        VIJAYARAJAN NARAYANRAO, MD  [facility]   06/23/2024      Hospital Contact Information:   [facility]) [facility]) [facility])   Pending tests:   Laboratory: [phone]  Microbiology: [phone]  Pathology: [phone]  Radiology: [phone]    General questions:  [phone] Pending tests:  Laboratory: [phone]  Microbiology: [phone]  Pathology: [phone]  Radiology: [phone]    General questions:   [phone] Pending tests:   Laboratory: [phone]  Microbiology: [phone]  Pathology: [phone]  Radiology: [phone]    General questions:   [phone]       Effective March 12, 2023, [facility], [facility], and/or [facility] refer to the [facility] campus of [facility].

## 2024-06-23 NOTE — Progress Notes (Signed)
 Case Manager Discharge Summary / Closing Note    Expected Discharge Date & Time: 06/23/2024 at     Discharge Plan:  The patient has been involved in the development of this plan and is in agreement.      Post-Acute Services Coordinated:  No resources indicated at this time    Funding for Discharge Medications: Drug assistance not needed       Transportation:  Arrangements: patient arranged  Service Arranged: private vehicle    Final ADT:   Final ADT Discharge Disposition: Home Based  Home Based: Home or Self Care                   Second IMM Received:      Final Summary:  Per last notes, no HD chair, [place] to take [place] back to [place] Discharge order in place, discharging home, no further CM needs     REGGIE SANTIAGO

## 2024-06-25 NOTE — Progress Notes (Signed)
 [facility] Transitions : SI escalation      06/25/2024 1:05 PM    Patient unable to start outpatient dialysis in [place] .      Called and spoke with patient and her daughter.   Per patient, she was discharged and advised to start OP HD services in [place] .   They contacted [facility] in [place], services were declined, the reason- they have not received the transfer/referral note from [facility] provider.  Per IP CM note, patient was discharged to self-care, the OP HD chair was not secured at this time. The reason- patient has Medicaid for [place] , so [place] dialysis clinics not able to accept it. Patient was advised to use ED services in [place]- f/u with Hospital ED for outpatient HD tx as she is going back to [place] to live.   CM sent a message to the discharging provider and case manager to confirm the discharge instructions.    CM called [facility] in [place] , spoke with Arlene. She confirmed that the MD issues the letter to decline admission due to inactive Medicaid. CM asked Arlene to confirm if they will accept patient once her Medicaid will be reinstated, and she did confirmed. Arlene in addition advised for the patient to use hospital ED services for her OP Dialysis sessions.    CM called back to speak with patient and her daughter. Spoke with patient's daughter, advised her to reinstate her mother's Medicaid. Per caregiver, she applied for reinstating medicaid some time ago, and it should be active. CM advised to check on it. Once it's active, they can return back to this clinic and patient will be accepted. Until then, patient was advised to use hospital ED for her dialysis needs - T, TH, Sat. Caregiver voiced understanding and agreement.     Burtis Grow RN-BC

## 2024-06-26 ENCOUNTER — Emergency Department: Admit: 2024-06-26 | Payer: MEDICAID | Primary: Adult Health

## 2024-06-26 ENCOUNTER — Observation Stay: Admit: 2024-06-26 | Payer: MEDICAID | Primary: Adult Health

## 2024-06-26 ENCOUNTER — Inpatient Hospital Stay
Admit: 2024-06-26 | Discharge: 2024-07-03 | Disposition: A | Discharge status: Home / Self Care | Payer: MEDICAID | Attending: Student in an Organized Health Care Education/Training Program | Admitting: Student in an Organized Health Care Education/Training Program

## 2024-06-26 DIAGNOSIS — N186 End stage renal disease: Secondary | ICD-10-CM

## 2024-06-26 DIAGNOSIS — Z992 Dependence on renal dialysis: Principal | ICD-10-CM

## 2024-06-26 DIAGNOSIS — E8770 Fluid overload, unspecified: Secondary | ICD-10-CM

## 2024-06-26 LAB — CBC WITH AUTO DIFFERENTIAL
Basophils %: 1.4 % — ABNORMAL HIGH (ref 0.0–1.0)
Basophils Absolute: 0.1 K/UL (ref 0.00–0.10)
Eosinophils %: 9.8 % — ABNORMAL HIGH (ref 0.0–7.0)
Eosinophils Absolute: 0.7 K/UL — ABNORMAL HIGH (ref 0.00–0.40)
Hematocrit: 34.4 % — ABNORMAL LOW (ref 35.0–47.0)
Hemoglobin: 10.6 g/dL — ABNORMAL LOW (ref 11.5–16.0)
Immature Granulocytes %: 0.3 % (ref 0.0–0.5)
Immature Granulocytes Absolute: 0.02 K/UL (ref 0.00–0.04)
Lymphocytes %: 15 % (ref 12.0–49.0)
Lymphocytes Absolute: 1.07 K/UL (ref 0.80–3.50)
MCH: 30.9 pg (ref 26.0–34.0)
MCHC: 30.8 g/dL (ref 30.0–36.5)
MCV: 100.3 FL — ABNORMAL HIGH (ref 80.0–99.0)
MPV: 9.3 FL (ref 8.9–12.9)
Monocytes %: 9.1 % (ref 5.0–13.0)
Monocytes Absolute: 0.65 K/UL (ref 0.00–1.00)
Neutrophils %: 64.4 % (ref 32.0–75.0)
Neutrophils Absolute: 4.6 K/UL (ref 1.80–8.00)
Nucleated RBCs: 0 /100{WBCs}
Platelets: 370 K/uL (ref 150–400)
RBC: 3.43 M/uL — ABNORMAL LOW (ref 3.80–5.20)
RDW: 16.9 % — ABNORMAL HIGH (ref 11.5–14.5)
WBC: 7.1 K/uL (ref 3.6–11.0)
nRBC: 0 K/uL (ref 0.00–0.01)

## 2024-06-26 LAB — COMPREHENSIVE METABOLIC PANEL
ALT: 206 U/L — ABNORMAL HIGH (ref 10–35)
AST: 106 U/L — ABNORMAL HIGH (ref 10–35)
Albumin/Globulin Ratio: 0.7 — ABNORMAL LOW (ref 1.1–2.2)
Albumin: 2.9 g/dL — ABNORMAL LOW (ref 3.5–5.2)
Alk Phosphatase: 761 U/L — ABNORMAL HIGH (ref 35–104)
Anion Gap: 14 mmol/L (ref 2–14)
BUN/Creatinine Ratio: 11 — ABNORMAL LOW (ref 12–20)
BUN: 52 mg/dL — ABNORMAL HIGH (ref 6–20)
CO2: 21 mmol/L (ref 20–29)
Calcium: 8.6 mg/dL (ref 8.6–10.0)
Chloride: 103 mmol/L (ref 98–107)
Creatinine: 4.56 mg/dL — ABNORMAL HIGH (ref 0.60–1.00)
Est, Glom Filt Rate: 12 ml/min/1.73m2 — ABNORMAL LOW (ref >59)
Globulin: 3.9 g/dL (ref 2.0–4.0)
Glucose: 211 mg/dL — ABNORMAL HIGH (ref 65–100)
Potassium: 5.2 mmol/L — ABNORMAL HIGH (ref 3.5–5.1)
Sodium: 138 mmol/L (ref 136–145)
Total Bilirubin: 0.5 mg/dL (ref 0.0–1.2)
Total Protein: 6.7 g/dL (ref 6.4–8.3)

## 2024-06-26 LAB — POCT GLUCOSE: POC Glucose: 294 mg/dL — ABNORMAL HIGH (ref 65–117)

## 2024-06-26 LAB — EKG 12-LEAD
Atrial Rate: 78 {beats}/min
Diagnosis: NORMAL
P Axis: 3 degrees
P-R Interval: 162 ms
Q-T Interval: 430 ms
QRS Duration: 92 ms
QTc Calculation (Bazett): 490 ms
R Axis: 158 degrees
T Axis: 108 degrees
Ventricular Rate: 78 {beats}/min

## 2024-06-26 LAB — BRAIN NATRIURETIC PEPTIDE: NT Pro-BNP: 47007 pg/mL — ABNORMAL HIGH (ref 0–125)

## 2024-06-26 LAB — TROPONIN: Troponin T: 126 ng/L (ref 0–14)

## 2024-06-26 MED ORDER — SODIUM CHLORIDE 0.9 % IV SOLN
0.9 | INTRAVENOUS | Status: DC | PRN
Start: 2024-06-26 — End: 2024-07-02

## 2024-06-26 MED ORDER — PANTOPRAZOLE SODIUM 40 MG PO TBEC
40 | Freq: Two times a day (BID) | ORAL | Status: DC
Start: 2024-06-26 — End: 2024-06-28
  Administered 2024-06-26 – 2024-06-28 (×4): 40 mg via ORAL

## 2024-06-26 MED ORDER — METOPROLOL SUCCINATE ER 50 MG PO TB24
50 | Freq: Every day | ORAL | Status: DC
Start: 2024-06-26 — End: 2024-06-26

## 2024-06-26 MED ORDER — HEPARIN SODIUM (PORCINE) 5000 UNIT/ML IJ SOLN
5000 | Freq: Three times a day (TID) | INTRAMUSCULAR | Status: DC
Start: 2024-06-26 — End: 2024-07-02
  Administered 2024-06-27 – 2024-07-02 (×9): 5000 [IU] via SUBCUTANEOUS

## 2024-06-26 MED ORDER — NORMAL SALINE FLUSH 0.9 % IV SOLN
0.9 | Freq: Two times a day (BID) | INTRAVENOUS | Status: DC
Start: 2024-06-26 — End: 2024-07-02
  Administered 2024-06-27 – 2024-06-29 (×4): 10 mL via INTRAVENOUS
  Administered 2024-06-29: 01:00:00 5 mL via INTRAVENOUS
  Administered 2024-06-29 – 2024-07-02 (×6): 10 mL via INTRAVENOUS

## 2024-06-26 MED ORDER — ACETAMINOPHEN 325 MG PO TABS
325 | Freq: Four times a day (QID) | ORAL | Status: DC | PRN
Start: 2024-06-26 — End: 2024-06-28

## 2024-06-26 MED ORDER — POLYETHYLENE GLYCOL 3350 17 G PO PACK
17 | Freq: Every day | ORAL | Status: DC | PRN
Start: 2024-06-26 — End: 2024-07-02

## 2024-06-26 MED ORDER — DEXTROSE 10 % IV BOLUS
INTRAVENOUS | Status: DC | PRN
Start: 2024-06-26 — End: 2024-07-02

## 2024-06-26 MED ORDER — LINACLOTIDE 290 MCG PO CAPS
290 | Freq: Every day | ORAL | Status: DC
Start: 2024-06-26 — End: 2024-06-26

## 2024-06-26 MED ORDER — BUSPIRONE HCL 15 MG PO TABS
15 | Freq: Three times a day (TID) | ORAL | Status: DC
Start: 2024-06-26 — End: 2024-06-26

## 2024-06-26 MED ORDER — DULOXETINE HCL 30 MG PO CPEP
30 | Freq: Every day | ORAL | Status: DC
Start: 2024-06-26 — End: 2024-06-26

## 2024-06-26 MED ORDER — CARVEDILOL 12.5 MG PO TABS
12.5 | Freq: Two times a day (BID) | ORAL | Status: DC
Start: 2024-06-26 — End: 2024-07-02
  Administered 2024-06-26 – 2024-07-01 (×11): 12.5 mg via ORAL

## 2024-06-26 MED ORDER — ONDANSETRON 4 MG PO TBDP
4 | Freq: Three times a day (TID) | ORAL | Status: DC | PRN
Start: 2024-06-26 — End: 2024-07-02

## 2024-06-26 MED ORDER — HEPARIN SODIUM (PORCINE) 10000 UNIT/ML IJ SOLN
10000 | Freq: Three times a day (TID) | INTRAMUSCULAR | Status: DC
Start: 2024-06-26 — End: 2024-06-26

## 2024-06-26 MED ORDER — ONDANSETRON 4 MG PO TBDP
4 | Freq: Three times a day (TID) | ORAL | Status: DC | PRN
Start: 2024-06-26 — End: 2024-07-02
  Administered 2024-06-27: 16:00:00 4 mg via ORAL

## 2024-06-26 MED ORDER — METOCLOPRAMIDE HCL 10 MG PO TABS
10 | Freq: Three times a day (TID) | ORAL | Status: DC
Start: 2024-06-26 — End: 2024-07-02
  Administered 2024-06-26 – 2024-07-02 (×16): 5 mg via ORAL

## 2024-06-26 MED ORDER — SENNA-DOCUSATE SODIUM 8.6-50 MG PO TABS
8.6-50 | Freq: Two times a day (BID) | ORAL | Status: DC
Start: 2024-06-26 — End: 2024-07-02
  Administered 2024-06-28 – 2024-07-02 (×10): 1 via ORAL

## 2024-06-26 MED ORDER — INSULIN LISPRO 100 UNIT/ML IJ SOLN
100 | Freq: Four times a day (QID) | INTRAMUSCULAR | Status: DC
Start: 2024-06-26 — End: 2024-07-02
  Administered 2024-06-27: 01:00:00 4 [IU] via SUBCUTANEOUS
  Administered 2024-06-27: 21:00:00 2 [IU] via SUBCUTANEOUS
  Administered 2024-06-28: 16:00:00 6 [IU] via SUBCUTANEOUS
  Administered 2024-06-28: 02:00:00 4 [IU] via SUBCUTANEOUS
  Administered 2024-06-29: 16:00:00 2 [IU] via SUBCUTANEOUS
  Administered 2024-06-29: 01:00:00 4 [IU] via SUBCUTANEOUS
  Administered 2024-06-30: 03:00:00 6 [IU] via SUBCUTANEOUS
  Administered 2024-06-30 – 2024-07-01 (×3): 2 [IU] via SUBCUTANEOUS

## 2024-06-26 MED ORDER — CLOPIDOGREL BISULFATE 75 MG PO TABS
75 | Freq: Every day | ORAL | Status: DC
Start: 2024-06-26 — End: 2024-07-02
  Administered 2024-06-27 – 2024-07-02 (×6): 75 mg via ORAL

## 2024-06-26 MED ORDER — ACETAMINOPHEN 650 MG RE SUPP
650 | Freq: Four times a day (QID) | RECTAL | Status: DC | PRN
Start: 2024-06-26 — End: 2024-06-28

## 2024-06-26 MED ORDER — SODIUM ZIRCONIUM CYCLOSILICATE 10 G PO PACK
10 | Freq: Three times a day (TID) | ORAL | Status: AC
Start: 2024-06-26 — End: 2024-06-27
  Administered 2024-06-27: 03:00:00 10 g via GASTROSTOMY

## 2024-06-26 MED ORDER — GLUCAGON (RDNA) 1 MG IJ KIT
1 | INTRAMUSCULAR | Status: DC | PRN
Start: 2024-06-26 — End: 2024-07-02

## 2024-06-26 MED ORDER — ONDANSETRON HCL 4 MG/2ML IJ SOLN
4 | Freq: Four times a day (QID) | INTRAMUSCULAR | Status: DC | PRN
Start: 2024-06-26 — End: 2024-07-02
  Administered 2024-06-27 – 2024-07-01 (×8): 4 mg via INTRAVENOUS

## 2024-06-26 MED ORDER — NORMAL SALINE FLUSH 0.9 % IV SOLN
0.9 | INTRAVENOUS | Status: DC | PRN
Start: 2024-06-26 — End: 2024-07-02

## 2024-06-26 MED ORDER — HYDROMORPHONE 0.5MG/0.5ML IJ SOLN
1 | Status: DC | PRN
Start: 2024-06-26 — End: 2024-06-28
  Administered 2024-06-27 – 2024-06-28 (×5): 0.25 mg via INTRAVENOUS

## 2024-06-26 MED ORDER — BUSPIRONE HCL 5 MG PO TABS
5 | Freq: Two times a day (BID) | ORAL | Status: DC
Start: 2024-06-26 — End: 2024-07-02
  Administered 2024-06-27 – 2024-07-02 (×12): 5 mg via ORAL

## 2024-06-26 MED ORDER — GLUCOSE 4 G PO CHEW
4 | ORAL | Status: DC | PRN
Start: 2024-06-26 — End: 2024-07-02

## 2024-06-26 MED ORDER — AMLODIPINE BESYLATE 5 MG PO TABS
5 | Freq: Every day | ORAL | Status: DC
Start: 2024-06-26 — End: 2024-07-02
  Administered 2024-06-27 – 2024-07-01 (×5): 10 mg via ORAL

## 2024-06-26 MED ORDER — SODIUM BICARBONATE 650 MG PO TABS
650 | Freq: Three times a day (TID) | ORAL | Status: DC
Start: 2024-06-26 — End: 2024-06-26
  Administered 2024-06-26: 20:00:00 650 mg via ORAL

## 2024-06-26 MED ORDER — DEXTROSE 10 % IV SOLN
10 | INTRAVENOUS | Status: DC | PRN
Start: 2024-06-26 — End: 2024-07-02

## 2024-06-26 MED ORDER — DICYCLOMINE HCL 20 MG PO TABS
20 | Freq: Four times a day (QID) | ORAL | Status: DC | PRN
Start: 2024-06-26 — End: 2024-06-26

## 2024-06-26 MED ORDER — PROCHLORPERAZINE MALEATE 5 MG PO TABS
5 | Freq: Four times a day (QID) | ORAL | Status: DC | PRN
Start: 2024-06-26 — End: 2024-06-29

## 2024-06-26 MED ORDER — ASPIRIN 81 MG PO CHEW
81 | Freq: Every day | ORAL | Status: DC
Start: 2024-06-26 — End: 2024-07-02
  Administered 2024-06-26 – 2024-07-02 (×6): 81 mg via ORAL

## 2024-06-26 MED FILL — DEXTROSE 10 % IV SOLN: 10 % | INTRAVENOUS | Qty: 500 | Fill #0

## 2024-06-26 MED FILL — SODIUM BICARBONATE 650 MG PO TABS: 650 mg | ORAL | Qty: 1 | Fill #0

## 2024-06-26 MED FILL — PROCHLORPERAZINE MALEATE 5 MG PO TABS: 5 mg | ORAL | Qty: 2 | Fill #0

## 2024-06-26 MED FILL — LOKELMA 10 G PO PACK: 10 g | ORAL | Qty: 1 | Fill #0

## 2024-06-26 MED FILL — HEPARIN SODIUM (PORCINE) 10000 UNIT/ML IJ SOLN: 10000 [IU]/mL | INTRAMUSCULAR | Qty: 0.5 | Fill #0

## 2024-06-26 MED FILL — METOCLOPRAMIDE HCL 10 MG PO TABS: 10 mg | ORAL | Qty: 1 | Fill #0

## 2024-06-26 MED FILL — CARVEDILOL 12.5 MG PO TABS: 12.5 mg | ORAL | Qty: 1 | Fill #0

## 2024-06-26 MED FILL — ASPIRIN 81 MG PO CHEW: 81 mg | ORAL | Qty: 1 | Fill #0

## 2024-06-26 MED FILL — BD POSIFLUSH 0.9 % IV SOLN: 0.9 % | INTRAVENOUS | Qty: 40 | Fill #0

## 2024-06-26 MED FILL — PANTOPRAZOLE SODIUM 40 MG PO TBEC: 40 mg | ORAL | Qty: 1 | Fill #0

## 2024-06-26 NOTE — Care Coordination-Inpatient (Signed)
 06/26/24  2:17 PM    Case Management Consult:  OP HD set up    CM reviewed EMR and noted patient had recently been to [facility] and was followed by case management who was working to try to secure OP HD but were unable to due to her insurance status.     Per note from [facility] in [place] dated 06/21/24- Patient had Medicaid in the past but no longer has it and her daughter, Jonette reapplied in August but it remains pending. When the case manager from [employer] and spoke to Glade Kerns [phone]) they were told the family needed to submit additional information and that the application remains pending. Patient was told to go to the ED in [place]  for her OP HD until she could get an OP HD chair arranged.     This CM called Glade Kerns with [place] [employer] to check the status of the Medicaid application and left a HIPAA compliant VM. This CM asked patient access to check if the patient has Medicaid and she does not currently have Medicaid.     CM asked for assistance from CM team lead to assist with next steps with the OP HD chair. At this time, CM team lead has found out that the patients OP HD chair at [facility] is pending and is waiting to find out what the next steps.     CM will continue to update.    Arisbeth Purrington, MSW  [facility]  Case Manager    Available via [facility]

## 2024-06-26 NOTE — Plan of Care (Signed)
 Problem: Chronic Conditions and Co-morbidities  Goal: Patient's chronic conditions and co-morbidity symptoms are monitored and maintained or improved  Outcome: Progressing     Problem: Discharge Planning  Goal: Discharge to home or other facility with appropriate resources  Outcome: Progressing     Problem: Safety - Adult  Goal: Free from fall injury  Outcome: Progressing

## 2024-06-26 NOTE — Consults (Signed)
 Consult received, d/w ED provider  No OP HD chair, last HD on Saturday  On RA, BPS acceptable, very mild hyperkalemia  Will plan for HD in the am  Orders are in  Formal consult in the am

## 2024-06-26 NOTE — H&P (Signed)
 Hospitalist Admission Note    NAME:  Melinda [name]   DOB:  26-Jun-1981   MRN:  [id number]     Date/Time:  06/26/2024 5:00 PM    Patient PCP: Marlin Slider, APRN - NP  ________________________________________________________________________    Given the patient's current clinical presentation, I have a high level of concern for decompensation if discharged from the emergency department.  Complex decision making was performed, which includes reviewing the patient's available past medical records, laboratory results, and x-ray films.       My assessment of this patient's clinical condition and my plan of care is as follows.    Assessment / Plan:    Melinda [name] is a 43 y.o. female with PMHx ESRD, T2DM with gastroparesis requiring JG tube who is admitted for dialysis.    # ESRD on HD  POA, patient started on HD 04/2024, had been getting it at [facility] while they are on vacation.  Admitted for HD inpatient until she can be set up with an outpatient clinic.  -Nephrology consulted, orders in for HD  -Case management consulted to help establish with outpatient center  -Renally dose meds, avoid nephrotoxins  -Daily metabolic panel  -Replete electrolytes as needed  -Continue sodium bicarb    #Acute hyperkalemia  POA, K5.2.  In setting of ESRD with need for dialysis.  -Received Lokelma , D10, insulin   -Monitor electrolytes closely and treat as needed    #Acute LFT elevation  POA, ALT 206, AST 106, alk phos 761.  These have been elevated the past but not nearly this high.  -Monitor daily LFTs  -Obtain liver ultrasound    # T2DM with gastroparesis  # S/p GJ tube  POA, chronic.  Last A1c 7%.  Required GJ tube placement for adequate nutrition.  -Obtain updated A1c  -SSI  -Nutrition consulted    # Elevated troponin  # Elevated proBNP  POA, acute on chronic.  These likely represent false elevations in the setting of ESRD.  The patient does not have symptoms consistent with ACS or heart failure at this time.  No need to trend  further.      I have personally reviewed the radiographs, laboratory data in Epic and decisions and statements above are based partially on this personal interpretation.    Code Status: Full Code  DVT Prophylaxis: Hep SQ  GI Prophylaxis: PPI       Subjective:   CHIEF COMPLAINT: Need dialysis    HISTORY OF PRESENT ILLNESS:     Melinda [name] is a 43 y.o. female with PMHx ESRD, T2DM with gastroparesis requiring JG tube who presents for dialysis.  She last got HD on Saturday at [facility], where she was admitted while on vacation.  She was started on HD in July of this year and was getting it MWF at [facility], but last received it Saturday prior to discharge.  She has not yet been set up with an outpatient HD clinic.  She does not have chest pain, shortness of breath, dysuria.  She is here with her daughter who translates for her.  She does have a JG tube due to significant gastroparesis, though if she can get enough nutrition.  She does however still take food by mouth.    Available records were reviewed at the time of H&P.        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)  Gastroparesis     Gastroparesis     GERD (gastroesophageal reflux disease)     High cholesterol     Hypertension       Past Surgical History:   Procedure Laterality Date    CAPSULE ENDOSCOPY N/A 01/20/2023    ESOPHAGEAL CAPSULE ENDOSCOPY remove at 1624PM performed by Madlyn Fendt, MD at [facility] ENDOSCOPY    CHOLECYSTECTOMY, LAPAROSCOPIC N/A 02/03/2023    ROBOTIC LAPAROSCOPIC CHOLECYSTECTOMY with Indocyanine green  performed by Chrystal Elijah BRAVO, MD at [facility] MAIN OR    COLONOSCOPY N/A 01/19/2023    COLONOSCOPY DIAGNOSTIC performed by Madlyn Fendt, MD at [facility] ENDOSCOPY    INVASIVE VASCULAR N/A 10/03/2023    Angiography visceral SMA performed by Millard Oneil LABOR, MD at [facility] CARDIAC CATH LAB    INVASIVE VASCULAR N/A 10/03/2023    Ultrasound guided vascular access performed by Millard Oneil LABOR,  MD at [facility] CARDIAC CATH LAB    INVASIVE VASCULAR N/A 10/03/2023    Insert stent peripheral artery performed by Millard Oneil LABOR, MD at [facility] CARDIAC CATH LAB    IR NONTUNNELED VASCULAR CATHETER > 5 YEARS  10/10/2023    IR NONTUNNELED VASCULAR CATHETER > 5 YEARS 10/10/2023 [facility] CARDIAC CATH/EP/IR LAB    IR NONTUNNELED VASCULAR CATHETER > 5 YEARS  10/10/2023    IR NONTUNNELED VASCULAR CATHETER > 5 YEARS 10/10/2023 [facility] CARDIAC CATH/EP/IR LAB    OTHER SURGICAL HISTORY Left     Rentia attachment    TUBAL LIGATION Bilateral     UPPER GASTROINTESTINAL ENDOSCOPY N/A 01/17/2023    ESOPHAGOGASTRODUODENOSCOPY performed by Madlyn Fendt, MD at [facility] ENDOSCOPY    UPPER GASTROINTESTINAL ENDOSCOPY N/A 01/17/2023    ESOPHAGOGASTRODUODENOSCOPY BIOPSY performed by Madlyn Fendt, MD at [facility] ENDOSCOPY    UPPER GASTROINTESTINAL ENDOSCOPY N/A 01/18/2023    ESOPHAGOGASTRODUODENOSCOPY performed by Madlyn Fendt, MD at [facility] ENDOSCOPY    UPPER GASTROINTESTINAL ENDOSCOPY N/A 05/13/2023    ESOPHAGOGASTRODUODENOSCOPY performed by Roseann Lonni BIRCH, MD at [facility] ENDOSCOPY    UPPER GASTROINTESTINAL ENDOSCOPY N/A 05/13/2023    ESOPHAGOGASTRODUODENOSCOPY BIOPSY performed by Roseann Lonni BIRCH, MD at [facility] ENDOSCOPY    UPPER GASTROINTESTINAL ENDOSCOPY N/A 09/21/2023    ESOPHAGOGASTRODUODENOSCOPY performed by Madlyn Fendt, MD at [facility] ENDOSCOPY    UPPER GASTROINTESTINAL ENDOSCOPY N/A 09/21/2023    ESOPHAGOGASTRODUODENOSCOPY BIOPSY performed by Madlyn Fendt, MD at [facility] ENDOSCOPY    US  ABSCESS DRAINAGE PERITONEAL/RETROPERITONEAL [facility]  02/11/2023    US  ABSCESS DRAINAGE PERITONEAL 02/11/2023 [facility] RAD US      Social History     Tobacco Use    Smoking status: Never    Smokeless tobacco: Never   Substance Use Topics    Alcohol use: Never      No family history on file.     No Known Allergies     Prior to Admission medications   Medication Sig Start Date End Date Taking? Authorizing Provider   atorvastatin  (LIPITOR ) 80 MG tablet Take 1 tablet by mouth nightly   Yes [provider]   carvedilol  (COREG ) 12.5 MG tablet Take 1 tablet by mouth 2 times daily (with meals)   Yes [provider]   famotidine  (PEPCID ) 20 MG tablet Take 1 tablet by mouth 2 times daily   Yes [provider]   hydrALAZINE  (APRESOLINE ) 50 MG tablet Take 1 tablet by mouth 3 times daily   Yes [provider]   sodium bicarbonate  650 MG tablet Take 1 tablet by mouth 3 times daily 02/23/24   Muncy, Manuelita Bohr,  MD   metoclopramide  (REGLAN ) 5 MG tablet Take 1 tablet by mouth 3 times daily (before meals) 02/23/24   Muncy, Manuelita Bohr, MD   pantoprazole  (PROTONIX ) 40 MG tablet Take 1 tablet by mouth 2 times daily (before meals) 02/23/24   Muncy, Manuelita Bohr, MD   sennosides-docusate sodium  (SENOKOT-S) 8.6-50 MG tablet Take 1 tablet by mouth in the morning and at bedtime 11/06/23   Do, Khoi B, MD   ondansetron  (ZOFRAN -ODT) 4 MG disintegrating tablet Take 1 tablet by mouth every 8 hours as needed for Nausea or Vomiting 11/06/23   Do, Khoi B, MD   prochlorperazine  (COMPAZINE ) 10 MG tablet Take 1 tablet by mouth every 6 hours as needed (nausea or vomiting) 10/25/23   Orson Planas, MD   metFORMIN  (GLUCOPHAGE -XR) 500 MG extended release tablet Take 2 tablets by mouth daily (with breakfast)  Patient not taking: Reported on 06/26/2024 10/25/23   Aktig, Ziya, MD   busPIRone  (BUSPAR ) 15 MG tablet Take 15 mg by mouth 3 times daily  Patient taking differently: Take 5 mg by mouth in the morning and at bedtime 10/14/23   Tefera, Mesfin A, MD   DULoxetine  (CYMBALTA ) 60 MG extended release capsule Take 1 capsule by mouth daily  Patient not taking: Reported on 06/26/2024 10/15/23   Tefera, Mesfin A, MD   clopidogrel  (PLAVIX ) 75 MG tablet Take 1 tablet by mouth daily 10/15/23   Tefera, Mesfin A, MD   aspirin  81 MG chewable tablet Take 1 tablet by mouth daily 10/15/23   Tefera, Mesfin A, MD   linaclotide  (LINZESS ) 290 MCG CAPS capsule Take 1 capsule by mouth every morning (before breakfast)  Patient not taking:  Reported on 06/26/2024 09/23/23   Sueellen Manuelita Bohr, MD   dicyclomine  (BENTYL ) 20 MG tablet Take 1 tablet by mouth every 6 hours as needed (abdominal cramping)  Patient not taking: Reported on 06/26/2024 09/14/23   Lyell, Prentice BIRCH, MD   metoprolol  succinate (TOPROL  XL) 50 MG extended release tablet Take 1 tablet by mouth daily  Patient not taking: Reported on 06/26/2024 03/23/23   Do, Rodrick B, MD   amLODIPine  (NORVASC ) 10 MG tablet Take 1 tablet by mouth daily 03/23/23   Do, Rodrick NOVAK, MD       REVIEW OF SYSTEMS:  See HPI for details      Objective:   VITALS:    Vitals:    06/26/24 1616   BP: (!) 164/85   Pulse: 75   Resp: 18   Temp: 98.1 F (36.7 C)   SpO2: 94%     PHYSICAL EXAM:    Physical Exam:    General: No acute distress. Alert. Cooperative.  Head: Normocephalic. Atraumatic.  Mouth: Mucosa pink. Moist mucous membranes.   Respiratory: CTAB. No increased work of breathing. Symmetric chest rise b/l.  Cardiovascular: RRR. No clubbing or cyanosis  GI: NTND, +BS.  GJ tube in place.  Extremities: no LE edema. Moving all extremities spontaneously.  Musculoskeletal: Full ROM in all extremities, no obvious deformities. +peripheral pulses.  Skin: Warm, dry. No rashes.   Neuro: Alert, normal behavior. No focal deficit.     Labs:    Recent Labs     06/26/24  1302   WBC 7.1   HGB 10.6*   HCT 34.4*   PLT 370     Recent Labs     06/26/24  1302   NA 138   K 5.2*   CL 103   CO2 21   BUN 52*  ALT 206*     No components found for: GLPOC    No results for input(s): INR in the last 72 hours.    _______________________________________________________________________  Care Plan discussed with:    Comments   Patient x Discussed with patient in room. POC outlined and Questions answered    Family  x    RN x    Care Manager                    Consultant:  x ED MD   _______________________________________________________________________  Recommended Disposition:   Home with Family x   HH/PT/OT/RN    SNF/LTC    [facility]     ________________________________________________________________________  TOTAL TIME:  45 Minutes        Comments   >50% of visit spent in counseling and coordination of care x Chart reviewed  Discussion with patient and/or family and questions answered     ________________________________________________________________________  Signed: Warren LITTIE Olp, MD        Procedures: see electronic medical records for all procedures/Xrays/labs and details which were not copied into this note but were reviewed prior to creation of Plan.

## 2024-06-26 NOTE — Care Coordination-Inpatient (Addendum)
 06/26/2024 3:25 PM CM received call from [facility] with [facility] admissions who reported after further review pt's husband's income was over their financial criteria for [facility] to accept Medicaid pending. Ronal reported pt will need emergency or full Medicaid before they can accept pt.     06/26/2024 2:24 PM CM called to [facility] with [phone]) regarding pt's need for outpatient HD set up. Ronal reported pt's referral is pending with [facility] and she will follow up with their facility administrator and update CM after.   Rocky Pinal, BSW

## 2024-06-26 NOTE — ED Notes (Signed)
 Received and verbally repeated the following test results Lab: [facility]     on 06/26/2024 , at 2:35 PM.    Dr. Maranda was notified and provided a verbal readback of the results listed above on 2:35 PM.     Orders were received at this time No      GRAYCE LITTIE NOVEL, LPN

## 2024-06-26 NOTE — ED Notes (Signed)
 TRANSFER - OUT REPORT:    Verbal report given to Jaclyn RN on Melinda [name]  being transferred to [facility] for Inpatient Admission       Report consisted of patient's Situation, Background, Assessment and   Recommendations(SBAR).     Information from the following report(s) Nurse Handoff Report, ED Encounter Summary, ED SBAR, MAR, Recent Results, and Cardiac Rhythm NS was reviewed with the receiving nurse.    Kinder Fall Assessment:                           Lines:   Peripheral IV 06/26/24 Right Antecubital (Active)        Opportunity for questions and clarification was provided.      Patient transported with:  [employer]

## 2024-06-26 NOTE — ED Provider Notes (Signed)
 [facility] A[facility] OBSERVATION UNIT  EMERGENCY DEPARTMENT ENCOUNTER      Pt Name: Melinda [name]  MRN: [id number]  Birthdate Jan 13, 1981  Date of evaluation: 06/26/2024  Provider: Armida SHAUNNA Moose, MD      HISTORY OF PRESENT ILLNESS      43 year old Hispanic female with history of end-stage renal disease, diabetes, diabetic gastroparesis presenting to the ER for needs dialysis .  Patient reports that she last got hemodialysis on Saturday at [facility].  She was admitted down there while she was visiting her brother for vacation.  States she does not have a current hemodialysis clinic.  She just started on hemodialysis in July 2025 for progressive kidney disease.  Reports that she has had an ongoing cough for the last few days and back pain.                  Nursing Notes were reviewed.    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)     Gastroparesis     Gastroparesis     GERD (gastroesophageal reflux disease)     High cholesterol     Hypertension          SURGICAL HISTORY       Past Surgical History:   Procedure Laterality Date    CAPSULE ENDOSCOPY N/A 01/20/2023    ESOPHAGEAL CAPSULE ENDOSCOPY remove at 1624PM performed by Madlyn Fendt, MD at [facility] ENDOSCOPY    CHOLECYSTECTOMY, LAPAROSCOPIC N/A 02/03/2023    ROBOTIC LAPAROSCOPIC CHOLECYSTECTOMY with Indocyanine green  performed by Chrystal Elijah BRAVO, MD at [facility] MAIN OR    COLONOSCOPY N/A 01/19/2023    COLONOSCOPY DIAGNOSTIC performed by Madlyn Fendt, MD at [facility] ENDOSCOPY    INVASIVE VASCULAR N/A 10/03/2023    Angiography visceral SMA performed by Millard Oneil LABOR, MD at [facility] CARDIAC CATH LAB    INVASIVE VASCULAR N/A 10/03/2023    Ultrasound guided vascular access performed by Millard Oneil LABOR, MD at [facility] CARDIAC CATH LAB    INVASIVE VASCULAR N/A 10/03/2023    Insert stent peripheral artery performed by Millard Oneil LABOR, MD at  [facility] CARDIAC CATH LAB    IR NONTUNNELED VASCULAR CATHETER > 5 YEARS  10/10/2023    IR NONTUNNELED VASCULAR CATHETER > 5 YEARS 10/10/2023 [facility] CARDIAC CATH/EP/IR LAB    IR NONTUNNELED VASCULAR CATHETER > 5 YEARS  10/10/2023    IR NONTUNNELED VASCULAR CATHETER > 5 YEARS 10/10/2023 [facility] CARDIAC CATH/EP/IR LAB    OTHER SURGICAL HISTORY Left     Rentia attachment    TUBAL LIGATION Bilateral     UPPER GASTROINTESTINAL ENDOSCOPY N/A 01/17/2023    ESOPHAGOGASTRODUODENOSCOPY performed by Madlyn Fendt, MD at [facility] ENDOSCOPY    UPPER GASTROINTESTINAL ENDOSCOPY N/A 01/17/2023    ESOPHAGOGASTRODUODENOSCOPY BIOPSY performed by Madlyn Fendt, MD at [facility] ENDOSCOPY    UPPER GASTROINTESTINAL ENDOSCOPY N/A 01/18/2023    ESOPHAGOGASTRODUODENOSCOPY performed by Madlyn Fendt, MD at [facility] ENDOSCOPY    UPPER GASTROINTESTINAL ENDOSCOPY N/A 05/13/2023    ESOPHAGOGASTRODUODENOSCOPY performed by Roseann Lonni BIRCH, MD at [facility] ENDOSCOPY    UPPER GASTROINTESTINAL ENDOSCOPY N/A 05/13/2023    ESOPHAGOGASTRODUODENOSCOPY BIOPSY performed by Roseann Lonni BIRCH, MD at [facility] ENDOSCOPY    UPPER GASTROINTESTINAL ENDOSCOPY N/A 09/21/2023  ESOPHAGOGASTRODUODENOSCOPY performed by Madlyn Fendt, MD at [facility] ENDOSCOPY    UPPER GASTROINTESTINAL ENDOSCOPY N/A 09/21/2023    ESOPHAGOGASTRODUODENOSCOPY BIOPSY performed by Madlyn Fendt, MD at [facility] ENDOSCOPY    US  ABSCESS DRAINAGE PERITONEAL/RETROPERITONEAL [facility]  02/11/2023    US  ABSCESS DRAINAGE PERITONEAL 02/11/2023 [facility] RAD US          CURRENT MEDICATIONS       Current Discharge Medication List        CONTINUE these medications which have NOT CHANGED    Details   atorvastatin  (LIPITOR ) 80 MG tablet Take 1 tablet by mouth nightly      carvedilol  (COREG ) 12.5 MG tablet Take 1 tablet by mouth 2 times daily (with meals)      famotidine  (PEPCID ) 20 MG tablet Take 1 tablet by mouth 2 times daily      hydrALAZINE  (APRESOLINE ) 50 MG tablet Take 1 tablet by mouth 3 times daily      sodium bicarbonate  650 MG tablet Take 1 tablet by mouth 3  times daily  Qty: 90 tablet, Refills: 0      metoclopramide  (REGLAN ) 5 MG tablet Take 1 tablet by mouth 3 times daily (before meals)  Qty: 120 tablet, Refills: 3      pantoprazole  (PROTONIX ) 40 MG tablet Take 1 tablet by mouth 2 times daily (before meals)  Qty: 30 tablet, Refills: 3      sennosides-docusate sodium  (SENOKOT-S) 8.6-50 MG tablet Take 1 tablet by mouth in the morning and at bedtime  Qty: 60 tablet, Refills: 1      ondansetron  (ZOFRAN -ODT) 4 MG disintegrating tablet Take 1 tablet by mouth every 8 hours as needed for Nausea or Vomiting  Qty: 30 tablet, Refills: 0      prochlorperazine  (COMPAZINE ) 10 MG tablet Take 1 tablet by mouth every 6 hours as needed (nausea or vomiting)  Qty: 120 tablet, Refills: 3      metFORMIN  (GLUCOPHAGE -XR) 500 MG extended release tablet Take 2 tablets by mouth daily (with breakfast)  Qty: 90 tablet, Refills: 1      busPIRone  (BUSPAR ) 15 MG tablet Take 15 mg by mouth 3 times daily  Qty: 30 tablet, Refills: 0      DULoxetine  (CYMBALTA ) 60 MG extended release capsule Take 1 capsule by mouth daily  Qty: 30 capsule, Refills: 3      clopidogrel  (PLAVIX ) 75 MG tablet Take 1 tablet by mouth daily  Qty: 30 tablet, Refills: 3      aspirin  81 MG chewable tablet Take 1 tablet by mouth daily  Qty: 30 tablet, Refills: 3      linaclotide  (LINZESS ) 290 MCG CAPS capsule Take 1 capsule by mouth every morning (before breakfast)  Qty: 30 capsule, Refills: 0      dicyclomine  (BENTYL ) 20 MG tablet Take 1 tablet by mouth every 6 hours as needed (abdominal cramping)  Qty: 90 tablet, Refills: 0      metoprolol  succinate (TOPROL  XL) 50 MG extended release tablet Take 1 tablet by mouth daily  Qty: 30 tablet, Refills: 2      amLODIPine  (NORVASC ) 10 MG tablet Take 1 tablet by mouth daily  Qty: 30 tablet, Refills: 2             ALLERGIES     Patient has no known allergies.    FAMILY HISTORY     No family history on file.       SOCIAL HISTORY       Social History     Socioeconomic  History    Marital status:  Married   Tobacco Use    Smoking status: Never    Smokeless tobacco: Never   Vaping Use    Vaping status: Never Used   Substance and Sexual Activity    Alcohol use: Never    Drug use: Never    Sexual activity: Defer     Social Drivers of Health     Financial Resource Strain: Low Risk  (05/02/2024)    Received from [facility]    Overall Financial Resource Strain (CARDIA)     Difficulty of Paying Living Expenses: Not hard at all   Recent Concern: Financial Resource Strain - High Risk (04/02/2024)    Received from [facility]    Overall Financial Resource Strain (CARDIA)     Difficulty of Paying Living Expenses: Hard   Food Insecurity: No Food Insecurity (06/26/2024)    Hunger Vital Sign     Worried About Running Out of Food in the Last Year: Never true     Ran Out of Food in the Last Year: Never true   Recent Concern: Food Insecurity - Food Insecurity Present (04/02/2024)    Received from [facility]    Hunger Vital Sign     Within the past 12 months, you worried that your food would run out before you got the money to buy more.: Often true     Within the past 12 months, the food you bought just didn't last and you didn't have money to get more.: Often true   Transportation Needs: No Transportation Needs (06/26/2024)    PRAPARE - Therapist, art (Medical): No     Lack of Transportation (Non-Medical): No   Recent Concern: Transportation Needs - Unmet Transportation Needs (05/02/2024)    Received from [facility] - Transportation     In the past 12 months, has lack of transportation kept you from medical appointments or from getting medications?: No     Lack of Transportation (Non-Medical): Yes   Housing Stability: Low Risk  (06/26/2024)    Housing Stability Vital Sign     Unable to Pay for Housing in the Last Year: No     Number of Times Moved in the Last Year: 0     Homeless in the Last Year: No         PHYSICAL EXAM          Body mass index is 25.65 kg/m.    Physical Exam  Vitals and nursing note reviewed.   Constitutional:       Appearance: Normal appearance.   HENT:      Head: Normocephalic and atraumatic.      Nose: Nose normal.      Mouth/Throat:      Mouth: Mucous membranes are moist.      Pharynx: No oropharyngeal exudate.   Eyes:      Extraocular Movements: Extraocular movements intact.      Pupils: Pupils are equal, round, and reactive to light.   Cardiovascular:      Rate and Rhythm: Normal rate and regular rhythm.   Pulmonary:      Effort: Pulmonary effort is normal. No respiratory distress.      Breath sounds: Normal breath sounds.      Comments: Occasional dry cough, no hypoxia or respiratory distress  Abdominal:      General: Abdomen is flat. There is  no distension.      Palpations: Abdomen is soft.      Comments: Patient has a G-tube   Musculoskeletal:         General: No swelling or tenderness. Normal range of motion.      Cervical back: Normal range of motion and neck supple.   Skin:     General: Skin is warm and dry.   Neurological:      General: No focal deficit present.      Mental Status: She is alert and oriented to person, place, and time. Mental status is at baseline.             EMERGENCY DEPARTMENT COURSE and DIFFERENTIAL DIAGNOSIS/MDM:   Vitals:    Vitals:    06/27/24 1215 06/27/24 1230 06/27/24 1245 06/27/24 1352   BP: (!) 190/90 (!) 180/80 (!) 192/94 (!) 163/102   Pulse: 85 86 89 91   Resp:    18   Temp:    98.1 F (36.7 C)   TempSrc:    Oral   SpO2:    91%   Height:             Medical Decision Making  Previous chart review reveals admission at [facility] from July 22 to September 13  She had progressive renal failure that required initiation of hemodialysis at [facility].  She also had complications of gastroparesis requiring placement of a G-tube.  She does not have any establishment with a hemodialysis clinic here in [place]  thus she presented to the ER for dialysis today.        Amount  and/or Complexity of Data Reviewed  Labs: ordered. Decision-making details documented in ED Course.  Radiology: ordered.  ECG/medicine tests: ordered.    Risk  Decision regarding hospitalization.            REASSESSMENT     ED Course as of 06/27/24 1404   Tue Jun 26, 2024   1507 NT Pro-BNP(!): 47,007 [BN]   1610 I spoke to nephrology, they will plan for dialysis on the patient tomorrow [BN]   1611 Patient will be admitted to the hospitalist service.  I discussed case with the hospitalist.  In addition I have discussed case with the case management team and they will figure out logistics for complex social issues. [BN]      ED Course User Index  [BN] Maranda Armida SQUIBB, MD         CONSULTS:  IP CONSULT TO CASE MANAGEMENT  IP CONSULT TO DIETITIAN  IP CONSULT TO NEPHROLOGY  IP CONSULT TO CASE MANAGEMENT    PROCEDURES:     Procedures      Total critical care time (not including time spent performing separately reportable procedures): 32 minutes      (Please note that portions of this note were completed with a voice recognition program.  Efforts were made to edit the dictations but occasionally words are mis-transcribed.)    Armida SQUIBB Maranda, MD (electronically signed)  Emergency Attending Physician              Maranda Armida SQUIBB, MD  06/27/24 [phone]

## 2024-06-27 LAB — CBC WITH AUTO DIFFERENTIAL
Basophils %: 1.7 % — ABNORMAL HIGH (ref 0.0–1.0)
Basophils Absolute: 0.12 K/UL — ABNORMAL HIGH (ref 0.00–0.10)
Eosinophils %: 12.2 % — ABNORMAL HIGH (ref 0.0–7.0)
Eosinophils Absolute: 0.87 K/UL — ABNORMAL HIGH (ref 0.00–0.40)
Hematocrit: 33.2 % — ABNORMAL LOW (ref 35.0–47.0)
Hemoglobin: 10.5 g/dL — ABNORMAL LOW (ref 11.5–16.0)
Immature Granulocytes %: 0.3 % (ref 0.0–0.5)
Immature Granulocytes Absolute: 0.02 K/UL (ref 0.00–0.04)
Lymphocytes %: 14.3 % (ref 12.0–49.0)
Lymphocytes Absolute: 1.02 K/UL (ref 0.80–3.50)
MCH: 30.6 pg (ref 26.0–34.0)
MCHC: 31.6 g/dL (ref 30.0–36.5)
MCV: 96.8 FL (ref 80.0–99.0)
MPV: 9.4 FL (ref 8.9–12.9)
Monocytes %: 10.4 % (ref 5.0–13.0)
Monocytes Absolute: 0.74 K/UL (ref 0.00–1.00)
Neutrophils %: 61.1 % (ref 32.0–75.0)
Neutrophils Absolute: 4.36 K/UL (ref 1.80–8.00)
Nucleated RBCs: 0 /100{WBCs}
Platelets: 382 K/uL (ref 150–400)
RBC: 3.43 M/uL — ABNORMAL LOW (ref 3.80–5.20)
RDW: 16.8 % — ABNORMAL HIGH (ref 11.5–14.5)
WBC: 7.1 K/uL (ref 3.6–11.0)
nRBC: 0 K/uL (ref 0.00–0.01)

## 2024-06-27 LAB — HEPATIC FUNCTION PANEL
ALT: 196 U/L — ABNORMAL HIGH (ref 10–35)
AST: 128 U/L — ABNORMAL HIGH (ref 10–35)
Albumin/Globulin Ratio: 0.7 — ABNORMAL LOW (ref 1.1–2.2)
Albumin: 2.7 g/dL — ABNORMAL LOW (ref 3.5–5.2)
Alk Phosphatase: 729 U/L — ABNORMAL HIGH (ref 35–104)
Bilirubin, Direct: 0.1 mg/dL (ref 0.1–0.3)
Globulin: 3.8 g/dL (ref 2.0–4.0)
Total Bilirubin: 0.4 mg/dL (ref 0.0–1.2)
Total Protein: 6.6 g/dL (ref 6.4–8.3)

## 2024-06-27 LAB — RENAL FUNCTION PANEL
Albumin: 2.8 g/dL — ABNORMAL LOW (ref 3.5–5.2)
Anion Gap: 16 mmol/L — ABNORMAL HIGH (ref 2–14)
BUN/Creatinine Ratio: 11 — ABNORMAL LOW (ref 12–20)
BUN: 54 mg/dL — ABNORMAL HIGH (ref 6–20)
CO2: 18 mmol/L — ABNORMAL LOW (ref 20–29)
Calcium: 8.1 mg/dL — ABNORMAL LOW (ref 8.6–10.0)
Chloride: 103 mmol/L (ref 98–107)
Creatinine: 4.71 mg/dL — ABNORMAL HIGH (ref 0.60–1.00)
Est, Glom Filt Rate: 11 ml/min/1.73m2 — ABNORMAL LOW (ref >59)
Glucose: 118 mg/dL — ABNORMAL HIGH (ref 65–100)
Phosphorus: 7 mg/dL — ABNORMAL HIGH (ref 2.5–4.5)
Potassium: 5.6 mmol/L — ABNORMAL HIGH (ref 3.5–5.1)
Sodium: 137 mmol/L (ref 136–145)

## 2024-06-27 LAB — POCT GLUCOSE
POC Glucose: 141 mg/dL — ABNORMAL HIGH (ref 65–117)
POC Glucose: 210 mg/dL — ABNORMAL HIGH (ref 65–117)
POC Glucose: 194 mg/dL — ABNORMAL HIGH (ref 65–117)

## 2024-06-27 LAB — HEMOGLOBIN A1C
Estimated Avg Glucose: 127 mg/dL
Hemoglobin A1C: 6.1 % — ABNORMAL HIGH (ref 4.0–5.6)

## 2024-06-27 LAB — HEPATITIS B SURFACE ANTIGEN: Hepatitis B Surface Ag: NONREACTIVE

## 2024-06-27 MED ORDER — HYDRALAZINE HCL 25 MG PO TABS
25 | Freq: Three times a day (TID) | ORAL | Status: DC
Start: 2024-06-27 — End: 2024-07-02
  Administered 2024-06-28 – 2024-07-02 (×14): 50 mg via ORAL

## 2024-06-27 MED ORDER — ALBUMIN HUMAN 25 % IV SOLN
25 | INTRAVENOUS | Status: DC | PRN
Start: 2024-06-27 — End: 2024-07-02

## 2024-06-27 MED ORDER — SEVELAMER CARBONATE 0.8 G PO PACK
0.8 | Freq: Three times a day (TID) | ORAL | Status: DC
Start: 2024-06-27 — End: 2024-07-02
  Administered 2024-06-27 – 2024-06-30 (×7): 0.8 g via ORAL

## 2024-06-27 MED ORDER — CLONAZEPAM 0.5 MG PO TABS
0.5 | Freq: Every evening | ORAL | Status: DC | PRN
Start: 2024-06-27 — End: 2024-07-02
  Administered 2024-06-27 – 2024-07-02 (×4): 0.5 mg via ORAL

## 2024-06-27 MED ORDER — CULTURELLE PO CAPS
Freq: Every day | ORAL | Status: DC
Start: 2024-06-27 — End: 2024-07-02
  Administered 2024-06-28 – 2024-07-02 (×5): 1 via ORAL

## 2024-06-27 MED FILL — ALBUTEIN 25 % IV SOLN: 25 % | INTRAVENOUS | Qty: 100 | Fill #0

## 2024-06-27 MED FILL — SEVELAMER CARBONATE 0.8 G PO PACK: 0.8 g | ORAL | Qty: 1 | Fill #0

## 2024-06-27 MED FILL — INSULIN LISPRO 100 UNIT/ML IJ SOLN: 100 [IU]/mL | INTRAMUSCULAR | Qty: 4 | Fill #0

## 2024-06-27 MED FILL — HYDRALAZINE HCL 25 MG PO TABS: 25 mg | ORAL | Qty: 2 | Fill #0

## 2024-06-27 MED FILL — SENNOSIDES-DOCUSATE SODIUM 8.6-50 MG PO TABS: 8.6-50 mg | ORAL | Qty: 1 | Fill #0

## 2024-06-27 MED FILL — CLONAZEPAM 0.5 MG PO TABS: 0.5 mg | ORAL | Qty: 1 | Fill #0

## 2024-06-27 MED FILL — BUSPIRONE HCL 5 MG PO TABS: 5 mg | ORAL | Qty: 1 | Fill #0

## 2024-06-27 MED FILL — HEPARIN SODIUM (PORCINE) 5000 UNIT/ML IJ SOLN: 5000 [IU]/mL | INTRAMUSCULAR | Qty: 1 | Fill #0

## 2024-06-27 NOTE — Consults (Signed)
 Comprehensive Nutrition Assessment    Type and Reason for Visit:  Initial, Consult, Nutrition support    Nutrition Recommendations/Plan:   Begin cyclic feeds this afternoon:  Nepro 1.8 @ 45 mL/hr x 10 hours (2200-0800)  FWF 30 mL q 4 hours around the clock     Provide adult MVI daily to meet 100% RDIs    Consumes regular diet at baseline per daughter- unclear why dysphagia diet ordered  - Soft & Bite Sized, Low K+, Low Phos  - FR 1800 mL     Malnutrition Assessment:  Malnutrition Status:  Insufficient data (06/27/24 1306)  - patient unavailable    Nutrition Assessment:     Patient is a 43 year old female admitted with ESRD (end stage renal disease) on dialysis (HCC) [N18.6, Z99.2]. 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  RD consulted for TF order & management. HD MWF. G-J tube placed ~05/25/2024 per chart review, started cyclic feeds 8/18. Last documented tolerated 9/5 by RD. Patient has been in dialysis unit on three attempts to visit. Information obtained from Sharon, daughter bedside.     Per daughter bedside, patient has not received tube feeds since discharge from [facility] - patient does not like to use the tube. She has been eating small frequent snacks (6x/day), foods daughter has been making soft, bite sized for patient. She also snacks on foods such as cheetos, tortilla chips. Daughter endorses recent weight gain during recent hospitalization. Daughter is not certain that patient would agree to utilize tube. RD explained need for increased calories, and that patient is likely still not meeting energy/protein needs. She voices understanding.      Most recent feeds via GJ tube; NovaSource Renal 2.0, 48 mL x 15 hours (1800-0900). This provided 1684 kcals (100% estimated energy needs), 78 g protein (100% protein needs), 152 g carbohydrate, 0  g fiber, and 776 mls H2O/day.     Plan to order feeds providing ~50% of calorie needs, will re-assess if patient requires more/less nutrition support pending PO intake.     Tube feeds ordered as above to provide 450 mL/day; 810 kcal (52% needs), 72 g carbs with 11.5 g fiber, 36 g protein (50% needs). TF + FWF provides 507 mL H2O/day.     Wt Readings from Last 10 Encounters:   03/06/24 57.6 kg (127 lb)   02/21/24 58 kg (127 lb 13.9 oz)   11/28/23 52.2 kg (115 lb 1.3 oz)   10/31/23 52.6 kg (115 lb 15.4 oz)   10/21/23 55.2 kg (121 lb 11.1 oz)   10/12/23 60.4 kg (133 lb 3.2 oz)   10/03/23 56.7 kg (125 lb)   09/22/23 56.7 kg (125 lb)   09/06/23 56.7 kg (125 lb)   08/31/23 56.7 kg (125 lb)       Nutrition Related Findings:      Wound Type: None     Last BM:    Edema: None                    Nutr. Labs:    Lab Results   Component Value Date    CREATININE 4.71 (H) 06/27/2024    BUN 54 (H) 06/27/2024    NA 137 06/27/2024    K 5.6 (H) 06/27/2024    CL 103 06/27/2024    CO2 18 (L) 06/27/2024  Lab Results   Component Value Date/Time    POCGLU 141 06/27/2024 08:22 AM    POCGLU 294 06/26/2024 07:43 PM    POCGLU 174 03/07/2024 11:38 AM    POCGLU 170 03/07/2024 04:51 AM    POCGLU 155 03/07/2024 12:09 AM    POCGLU 172 02/23/2024 11:37 AM        Hemoglobin A1C   Date Value Ref Range Status   02/19/2024 7.0 (H) 4.0 - 5.6 % Final     Comment:     (NOTE)  HbA1C Interpretive Ranges  <5.7              Normal  5.7 - 6.4         Consider Prediabetes  >6.5              Consider Diabetes         Lab Results   Component Value Date/Time    MG 1.6 02/19/2024 05:02 AM       Lab Results   Component Value Date    CALCIUM  8.1 (L) 06/27/2024    PHOS 7.0 (H) 06/27/2024       Lab Results   Component Value Date    TRIG 155 (H) 10/12/2023    TRIG 453 (H) 09/21/2023    TRIG 346 (H) 09/01/2023       Nutr. Meds:  Scheduled Meds:   lactobacillus  1 capsule Oral Daily with breakfast    sevelamer   0.8 g Oral TID WC    hydrALAZINE   50 mg Oral 3 times per  day    sodium chloride  flush  5-40 mL IntraVENous 2 times per day    aspirin   81 mg Oral Daily    amLODIPine   10 mg Oral Daily    metoclopramide   5 mg Oral TID AC    pantoprazole   40 mg Oral BID AC    sennosides-docusate sodium   1 tablet Oral BID    clopidogrel   75 mg Oral Daily    busPIRone   5 mg Oral BID    carvedilol   12.5 mg Oral BID WC    sodium zirconium cyclosilicate   10 g PEG Tube TID    heparin  (porcine)  5,000 Units SubCUTAneous 3 times per day    insulin  lispro  0-8 Units SubCUTAneous 4x Daily AC & HS     Continuous Infusions:   sodium chloride       dextrose        PRN Meds: albumin  human 25%, sodium chloride  flush, sodium chloride , ondansetron  **OR** ondansetron , polyethylene glycol, acetaminophen  **OR** acetaminophen , prochlorperazine , ondansetron , HYDROmorphone , glucose, dextrose  bolus **OR** dextrose  bolus, glucagon  (rDNA), dextrose , clonazePAM       Current Nutrition Intake & Therapies:    Average Meal Intake: 1-25%  Average Supplements Intake: None Ordered  ADULT DIET; Dysphagia - Soft and Bite Sized; Low Potassium (Less than 3000 mg/day); Low Phosphorus (Less than 1000 mg); 1800 ml    Anthropometric Measures:  Height: 149.9 cm (4' 11)  Ideal Body Weight (IBW): 95 lbs (43 kg)       Current Body Weight: 61.2 kg (135 lb), 142.1 % IBW. Weight Source: Bed scale (9/5 outside facility)  Current BMI (kg/m2): 27.3  Usual Body Weight: 59 kg (130 lb)     % Weight Change (Calculated): 3.8  Weight Adjustment For: No Adjustment                 BMI Categories: Overweight (BMI 25.0-29.9)    Estimated Daily Nutrient Needs:  Energy Requirements  Based On: Kcal/kg  Weight Used for Energy Requirements: Current  Energy (kcal/day): 1534-1840 (25-30 kcal/kg)  Weight Used for Protein Requirements: Current  Protein (g/day): 73-92 (1.2-1.5 g/kg)  Method Used for Fluid Requirements: 1 ml/kcal  Fluid (ml/day): 500 mL + output    Nutrition Diagnosis:   Increased nutrient needs related to renal dysfunction as evidenced by  dialysis  Predicted inadequate energy intake related to altered GI function as evidenced by nutrition support - enteral nutrition (gastroparesis, GJ-tube placed within last month)    Nutrition Interventions:   Food and/or Nutrient Delivery: Continue Current Diet, Start Tube Feeding  Nutrition Education/Counseling: Survival skills/brief education completed (family member)  Coordination of Nutrition Care: Continue to monitor while inpatient  Plan of Care discussed with: patient's daughter, nursing    Goals:  Goals: Tolerate nutrition support at goal rate, by next RD assessment          Nutrition Monitoring and Evaluation:   Behavioral-Environmental Outcomes: None Identified  Food/Nutrient Intake Outcomes: Food and Nutrient Intake, Supplement Intake, Enteral Nutrition Intake/Tolerance  Physical Signs/Symptoms Outcomes: Biochemical Data, GI Status, Weight, Meal Time Behavior    Discharge Planning:    Enteral Nutrition, Continue current diet     Sharyne Mania, MS, RD, CNSC  Ext: [phone], or via PerfectServe

## 2024-06-27 NOTE — Progress Notes (Signed)
Report given to nurse on 5th floor.

## 2024-06-27 NOTE — Care Coordination-Inpatient (Signed)
 Care Management Initial Assessment  06/27/2024 9:50 AM  If patient is discharged prior to next notation, then this note serves as note for discharge by case management.    Reason for Admission:   ESRD (end stage renal disease) on dialysis (HCC) [N18.6, Z99.2]         Patient Admission Status: Observation  Date Admitted to INP: NA  RUR: Readmission Risk Score: 35.2    Hospitalization in the last 30 days (Readmission):  No        Advance Care Planning:  Code Status: Full Code  Primary Healthcare Decision Maker:    Primary Decision Maker: Melinda [name] [phone]   Advance Directive:      __________________________________________________________________________  Assessment:      06/27/24 0950   Service Assessment   Patient Orientation Unable to Assess  ([place] off the floor for dialysis)   History Provided By [name]/Family  (Daughter Melinda [name])   Primary Caregiver Self   Accompanied By/Relationship Melinda [name]   Support Systems Family Members   PCP Verified by CM Yes  (Patient was being seen by [facility] and is in the process of re-activating with the clinic.)   Last Visit to PCP Within last year   Prior Functional Level Independent in ADLs/IADLs   Current Functional Level Independent in ADLs/IADLs   Can patient return to prior living arrangement Yes   Ability to make needs known: Good   Family able to assist with home care needs: Yes  (Per daughter Melinda [name], the patient is independent with a walker but requires supervision and some assistane as needed with ADLs.)   Financial Resources None   [employer] None   Social/Functional History   Lives With Spouse;Family   Home Equipment Walker - Rolling   Receives Help From Family   Prior Level of Assist for ADLs Independent   Prior Level of Assist for [employer] Independent   Ambulation Assistance Independent   Prior Level of Assist for Transfers Independent   Mode of Engineer, water   Discharge Planning   Living Arrangements Spouse/Significant  Other;Family Members   Current Services Prior To Admission None   Potential Assistance Needed Other (Comment)  (Outpatient HD)   DME Ordered? No   Potential Assistance Purchasing Medications No  ([employer])   Type of Home Care Services None   Patient expects to be discharged to: [facility] At/After Discharge   Transition of Care Consult (CM Consult) Other  (Outpatient HD)   Services At/After Discharge   (Dialysis)   Mode of Transport at Discharge Other (see comment)  (Daughter)   Confirm Follow Up Transport Family   Condition of Participation: Discharge Planning   The Plan for Transition of Care is related to the following treatment goals: Home         Comments: CM attempted to meet with patient but she is off the floor receiving dialysis. CM met with her daughter Melinda [name] to complete initial assessment. Patient lives at home with her husband and three daughters. She is unemployed but her husband is employed full time. Patient uses  a walker at home. CM left voice message for Melinda [name] with [place] DSS, [phone]. Per previous CM documentation, patient will not be accepted by [facility]) without active Medicaid. CM to monitor.     Discharge Concerns: [x] Yes [] No [] Unknown   Describe: Patient in need of outpatient HD but has to have active Medicaid to be accepted.     Financial concerns/barriers: [x] Yes, explain: [] No []   Unknown/Not discussed  Patient is uninsured.  __________________________________________________________________________    Insurer:   Active Insurance as of 06/26/2024       Patient has no active insurance coverage on file for 06/26/2024.            PCP: Marlin Slider, APRN - NP   Address: [address] / [place] [address]   Phone number: [phone]    Pharmacy:   [facility] [address] [place] - [address] [place] [phone] - F [phone]  [address]  [place] [address]  Phone: [phone] Fax: [phone]    DC Transport: (P)  Family       Transition of care plan:    [] Unable to determine at this time. Awaiting clinical progress, and disposition recommendations.    []  Home. No assistance required.     []  Home. Pt refused recommended services.    [x]  Home with family assistance as needed, and outpatient follow-up.    []  Home with Outpatient PT and outpatient follow-up   Pt aware of OP appt? [] Yes, Provider:   [] Not scheduled   Transport provider:     []  Home with outpatient services.    Specify:    []  Home with Home Health   - Freedom of Choice offered? []  Yes, Preference:   []  NA    [] SNF/IPR   -[] Freedom of Choice offered, and preferences given:   [] Listing provided and preferences requested   -Status: [] Pending [] Accepted:    -Auth required: [] Yes [] No    -Auth initiated date:   -3 midnight stay required: [] Yes [] No  Date satisfied:     []  LTC:     []  Home with Hospice   - Freedom of Choice offered? []  Yes, Preference:   []  NA    []  [facility] information provided.     []  Other:       Melinda [name]  Case Management Department  For questions or concerns, please PerfectServe

## 2024-06-27 NOTE — Plan of Care (Signed)
 Problem: Chronic Conditions and Co-morbidities  Goal: Patient's chronic conditions and co-morbidity symptoms are monitored and maintained or improved  Outcome: Progressing     Problem: Discharge Planning  Goal: Discharge to home or other facility with appropriate resources  Outcome: Progressing     Problem: Safety - Adult  Goal: Free from fall injury  Outcome: Progressing     Problem: Pain  Goal: Verbalizes/displays adequate comfort level or baseline comfort level  Outcome: Progressing     Problem: Nutrition Deficit:  Goal: Optimize nutritional status  Outcome: Progressing

## 2024-06-27 NOTE — Consults (Cosign Needed)
 NEPHROLOGY CONSULT NOTE     Patient: Melinda [name] MRN: [id number]  PCP: Marlin Slider, APRN - NP   DOB:     March 25, 1981  Age:   43 y.o.  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 Hispanic female with CKDV with AKI requiring HD in [place] about 2 months ago presenting to the ED for HD. Pt was hospitalized at [facility] for the past 2 months and was dialyzed before dc on Saturday. She initially had intractable vomiting secondary to her gastroparesis prior to initial admission, was felt to have AKI on CKD V(likely progressive CKD) and was started on HD. She has a new L AVF that is functioning. Came with a cough and mild SOB. Daughter states she has J tube (not for venting) for nutrition when she is unable to tolerate po. Daughter administers meds via J tube. She was dc without a HD chair as she has no insurance.     Pt seen on HD. Has had a mild cough and some back pain. 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 We are consulted for HD needs.     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)     Gastroparesis     Gastroparesis     GERD (gastroesophageal reflux disease)     High cholesterol     Hypertension         Past Surgical Hx:     Past Surgical  History:   Procedure Laterality Date    CAPSULE ENDOSCOPY N/A 01/20/2023    ESOPHAGEAL CAPSULE ENDOSCOPY remove at 1624PM performed by Madlyn Fendt, MD at [facility] ENDOSCOPY    CHOLECYSTECTOMY, LAPAROSCOPIC N/A 02/03/2023    ROBOTIC LAPAROSCOPIC CHOLECYSTECTOMY with Indocyanine green  performed by Chrystal Elijah BRAVO, MD at [facility] MAIN OR    COLONOSCOPY N/A 01/19/2023    COLONOSCOPY DIAGNOSTIC performed by Madlyn Fendt, MD at [facility] ENDOSCOPY    INVASIVE VASCULAR N/A 10/03/2023    Angiography visceral SMA performed by Millard Oneil LABOR, MD at [facility] CARDIAC CATH LAB    INVASIVE VASCULAR N/A 10/03/2023    Ultrasound guided vascular access performed by Millard Oneil LABOR, MD at [facility] CARDIAC CATH LAB    INVASIVE VASCULAR N/A 10/03/2023    Insert stent peripheral artery performed by Millard Oneil LABOR, MD at [facility] CARDIAC CATH LAB    IR NONTUNNELED VASCULAR CATHETER > 5 YEARS  10/10/2023    IR NONTUNNELED VASCULAR CATHETER > 5 YEARS 10/10/2023 [facility]  CARDIAC CATH/EP/IR LAB    IR NONTUNNELED VASCULAR CATHETER > 5 YEARS  10/10/2023    IR NONTUNNELED VASCULAR CATHETER > 5 YEARS 10/10/2023 [facility] CARDIAC CATH/EP/IR LAB    OTHER SURGICAL HISTORY Left     Rentia attachment    TUBAL LIGATION Bilateral     UPPER GASTROINTESTINAL ENDOSCOPY N/A 01/17/2023    ESOPHAGOGASTRODUODENOSCOPY performed by Madlyn Fendt, MD at [facility] ENDOSCOPY    UPPER GASTROINTESTINAL ENDOSCOPY N/A 01/17/2023    ESOPHAGOGASTRODUODENOSCOPY BIOPSY performed by Madlyn Fendt, MD at [facility] ENDOSCOPY    UPPER GASTROINTESTINAL ENDOSCOPY N/A 01/18/2023    ESOPHAGOGASTRODUODENOSCOPY performed by Madlyn Fendt, MD at [facility] ENDOSCOPY    UPPER GASTROINTESTINAL ENDOSCOPY N/A 05/13/2023    ESOPHAGOGASTRODUODENOSCOPY performed by Roseann Lonni BIRCH, MD at [facility] ENDOSCOPY    UPPER GASTROINTESTINAL ENDOSCOPY N/A 05/13/2023    ESOPHAGOGASTRODUODENOSCOPY BIOPSY performed by Roseann Lonni BIRCH, MD at [facility] ENDOSCOPY    UPPER GASTROINTESTINAL ENDOSCOPY N/A 09/21/2023    ESOPHAGOGASTRODUODENOSCOPY performed by Madlyn Fendt,  MD at [facility] ENDOSCOPY    UPPER GASTROINTESTINAL ENDOSCOPY N/A 09/21/2023    ESOPHAGOGASTRODUODENOSCOPY BIOPSY performed by Madlyn Fendt, MD at [facility] ENDOSCOPY    US  ABSCESS DRAINAGE PERITONEAL/RETROPERITONEAL [facility]  02/11/2023    US  ABSCESS DRAINAGE PERITONEAL 02/11/2023 [facility] RAD US        Medications:  Prior to Admission medications   Medication Sig Start Date End Date Taking? Authorizing Provider   atorvastatin  (LIPITOR ) 80 MG tablet Take 1 tablet by mouth nightly   Yes [provider]   carvedilol  (COREG ) 12.5 MG tablet Take 1 tablet by mouth 2 times daily (with meals)   Yes [provider]   famotidine  (PEPCID ) 20 MG tablet Take 1 tablet by mouth 2 times daily   Yes [provider]   hydrALAZINE  (APRESOLINE ) 50 MG tablet Take 1 tablet by mouth 3 times daily   Yes [provider]   sodium bicarbonate  650 MG tablet Take 1 tablet by mouth 3 times daily 02/23/24   Muncy, Manuelita Bohr, MD   metoclopramide  (REGLAN ) 5 MG tablet Take 1 tablet by mouth 3 times daily (before meals) 02/23/24   Muncy, Manuelita Bohr, MD   pantoprazole  (PROTONIX ) 40 MG tablet Take 1 tablet by mouth 2 times daily (before meals) 02/23/24   Muncy, Manuelita Bohr, MD   sennosides-docusate sodium  (SENOKOT-S) 8.6-50 MG tablet Take 1 tablet by mouth in the morning and at bedtime 11/06/23   Do, Khoi B, MD   ondansetron  (ZOFRAN -ODT) 4 MG disintegrating tablet Take 1 tablet by mouth every 8 hours as needed for Nausea or Vomiting 11/06/23   Do, Khoi B, MD   prochlorperazine  (COMPAZINE ) 10 MG tablet Take 1 tablet by mouth every 6 hours as needed (nausea or vomiting) 10/25/23   Orson Planas, MD   metFORMIN  (GLUCOPHAGE -XR) 500 MG extended release tablet Take 2 tablets by mouth daily (with breakfast)  Patient not taking: Reported on 06/26/2024 10/25/23   Aktig, Ziya, MD   busPIRone  (BUSPAR ) 15 MG tablet Take 15 mg by mouth 3 times daily  Patient taking differently: Take 5 mg by mouth in the morning and at bedtime 10/14/23   Tefera,  Mesfin A, MD   DULoxetine  (CYMBALTA ) 60 MG extended release capsule Take 1 capsule by mouth daily  Patient not taking: Reported on 06/26/2024 10/15/23   Tefera, Mesfin A, MD   clopidogrel  (PLAVIX ) 75 MG tablet Take 1 tablet by mouth daily 10/15/23   Tefera, Mesfin A, MD   aspirin  81 MG chewable tablet Take  1 tablet by mouth daily 10/15/23   Tefera, Mesfin A, MD   linaclotide  (LINZESS ) 290 MCG CAPS capsule Take 1 capsule by mouth every morning (before breakfast)  Patient not taking: Reported on 06/26/2024 09/23/23   Sueellen Manuelita Bohr, MD   dicyclomine  (BENTYL ) 20 MG tablet Take 1 tablet by mouth every 6 hours as needed (abdominal cramping)  Patient not taking: Reported on 06/26/2024 09/14/23   Lyell, Prentice BIRCH, MD   metoprolol  succinate (TOPROL  XL) 50 MG extended release tablet Take 1 tablet by mouth daily  Patient not taking: Reported on 06/26/2024 03/23/23   Do, Rodrick NOVAK, MD   amLODIPine  (NORVASC ) 10 MG tablet Take 1 tablet by mouth daily 03/23/23   Do, Khoi B, MD       No Known Allergies    Social Hx:  reports that she has never smoked. She has never used smokeless tobacco. She reports that she does not drink alcohol and does not use drugs.     No family history on file.    Review of Systems:  A twelve point review of system was performed today. Pertinent positives and negatives are mentioned in the HPI. The reminder of the ROS is negative and noncontributory.     Objective:    Vitals:    Vitals:    06/27/24 1000 06/27/24 1015 06/27/24 1030 06/27/24 1045   BP: 139/72 139/79 (!) 149/75 (!) 146/76   Pulse: 75 75 73 73   Resp:       Temp:       TempSrc:       SpO2:       Height:         I&O's:  No intake/output data recorded.  BP (!) 146/76   Pulse 73   Temp 98.4 F (36.9 C)   Resp 18   Ht 1.499 m (4' 11)   SpO2 99%   BMI 25.65 kg/m     Physical Exam:    GENERAL : Lying down in bed with no acute distress, sleeping peacefully  HEENT: AT [place] PEERLA   NECK: Supple no JVP  CVS: S1 S2 RRR  RS: CTABL, no rhonchi,or wheezing  heard, not on 02  ABDOMEN: soft NT ND positive BS  EXTREMITY: No edema   NEUROLOGY: AAA X3, no focal deficit or asterixis  HD access: LAVF    Laboratory Results:      Lab Results   Component Value Date/Time    NA 137 06/27/2024 05:10 AM    K 5.6 06/27/2024 05:10 AM    CL 103 06/27/2024 05:10 AM    CO2 18 06/27/2024 05:10 AM    BUN 54 06/27/2024 05:10 AM    CREATININE 4.71 06/27/2024 05:10 AM    GLUCOSE 118 06/27/2024 05:10 AM    CALCIUM  8.1 06/27/2024 05:10 AM          @LASTPROCAMB (POC81001;POC81003;poc81002;poc81000;POC81001F;POC81003E)@  No results found for: LABPROT, LABALBU     Lab Results   Component Value Date    WBC 7.1 06/27/2024    HGB 10.5 (L) 06/27/2024    HCT 33.2 (L) 06/27/2024    MCV 96.8 06/27/2024    PLT 382 06/27/2024    LYMPHOPCT 14.3 06/27/2024    RBC 3.43 (L) 06/27/2024    MCH 30.6 06/27/2024    MCHC 31.6 06/27/2024    RDW 16.8 (H) 06/27/2024                   Damien FORBES Leos, PA-C  06/27/2024  Thank you for consulting [facility] in the care of your patient.    [facility]:  [email or web address]  [email or web address]

## 2024-06-27 NOTE — Other (Signed)
 PRE TREATMENT   06/27/24 0847   Technical Checks   Dialysis Machine No. [id number]  [phone])   RO Machine Number [id number]  [phone])   Dialyzer Lot No. [id number]   Tubing Lot Number [id number]   All Connections Secure Yes   NS Bag Yes   Saline Line Double Clamped Yes   Dialyzer F-180   Prime Volume (mL) 200 mL   RO Machine Log Sheet Completed Yes   Machine Alarm Self Test Completed;Passed   Child psychotherapist Function;pH Reading   Sport and exercise psychologist Conductivity 13.8   Manual Ph 7.4   Bleach Test (Neg) Yes   Bath Temperature 98.6 F (37 C)   Citrate Bath   K+ (Potassium) 2   Ca+ (Calcium ) 2.5   Bicarb 40        06/27/24 0910   Observations & Evaluations   Level of Consciousness 0   Heart Rhythm Regular   Respiratory Quality/Effort Unlabored   O2 Device None (Room air)   Bilateral Breath Sounds Clear   Skin Condition/Temp Dry;Warm   Abdomen Inspection Soft   Edema None   Vital Signs   BP (!) 163/93   Temp 98.4 F (36.9 C)   Pulse 75   Respirations 18   SpO2 99 %   Pain Assessment   Pain Assessment 0-10   Pain Level 0   Technical Checks   ICEBOAT I;C;E;B;O;A;T   Hemodialysis Fistula/Graft Arteriovenous fistula Left Arm   No placement date or time found.   Present on Admission/Arrival: Yes  Access Type: Arteriovenous fistula  Orientation: Left  Access Location: Arm   Thrill Present   Bruit Present   Status Accessed   Venous Needle Size 15 G   Arterial Needle Size 15 G   Accessed By: Waneta Molt, RN   Access Attempts  1   Access Interventions Chlorhexidine;Aseptic Technique;Needles taped to patient   Primary RN SBAR: Amanda Methvin, RN  Incapacitated Nurse edu. provided: yes  Patient Education provided: hemodialysis procedural  Preferred Education method and Primary language: verbal english  Dialysis consent: chronic patient  Hospital General Consent Verified: yes  Hospital associated wait time; reason: none  Hepatitis B Surface Ag   Date/Time Value Ref Range Status    03/16/2023 08:19 AM <0.10 Index Final     Hep B S Ag Interp   Date/Time Value Ref Range Status   03/16/2023 08:19 AM Negative NEG   Final     Hep B results, dates, and Primary Source: HepbsAG Negative on 06/04/24; HepbsAB Susceptible on 01/21/24; Primary Source: Epic Care Everywhere [facility])  Hep B dual verification performed by (RN): Carren Lederer, RN  Machine disinfect process:heat    HEMODIALYSIS INITIATION   06/27/24 9078   Treatment   Time On 0921   Treatment Goal 2.5 Liters   Vital Signs   BP (!) 162/92   Pulse 74   Treatment Initiation   Dialyze Hours 3.5   Treatment  Initiation Universal Precautions maintained;Lines secured to patient;Connections secured;Prime given;Venous Parameters set;Arterial Parameters set;Air foam detector engaged;F180   During Hemodialysis Assessment   Blood Flow Rate (ml/min) 400 ml/min   Arterial Pressure (mmHg) -190 mmHg   Venous Pressure (mmHg) 170   TMP 10   DFR 600   Comments Treatment started   Access Visible Yes   Ultrafiltration Rate (ml/hr) 860 ml/hr   Ultrafiltration Removed (ml) 0 ml     POST TREATMENT   06/27/24 1255  Treatment   Time Off 1255   Observations & Evaluations   Level of Consciousness 0   Heart Rhythm Regular   Respiratory Quality/Effort Unlabored   O2 Device None (Room air)   Bilateral Breath Sounds Clear   Skin Condition/Temp Dry;Warm   Appetite Nausea   Abdomen Inspection Soft   Edema None   Vital Signs   BP (!) 184/87   Temp 98.5 F (36.9 C)   Pulse 74   Respirations 18   SpO2 99 %   Pain Assessment   Pain Assessment 0-10   Pain Level 10   Pain Location Back   During Hemodialysis Assessment   Blood Flow Rate (ml/min) 400 ml/min   Arterial Pressure (mmHg) -190 mmHg   Venous Pressure (mmHg) 170   TMP 10   DFR 600   Comments Treatment completed   Access Visible Yes   Ultrafiltration Rate (ml/hr) 850 ml/hr   Ultrafiltration Removed (ml) 3000 ml   Hemodialysis Fistula/Graft Arteriovenous fistula Left Arm   No placement date or time  found.   Present on Admission/Arrival: Yes  Access Type: Arteriovenous fistula  Orientation: Left  Access Location: Arm   Thrill Present   Bruit Present   Status Deaccessed   Date of Last Dressing Change 06/27/24   Dressing Intervention New;Dressing changed   Dressing Status Clean, dry & intact;New dressing applied   Post-Hemodialysis Assessment   Post-Treatment Procedures Blood returned;Access bleeding time < 10 minutes   Control and instrumentation engineer Disinfection;Acid/Vinegar Clean;Heat Disinfect   Rinseback Volume (ml) 300 ml   Blood Volume Processed (Liters) 79.8 L   Dialyzer Clearance Clear   Duration of Treatment (minutes) 210 minutes   Heparin  Amount Administered During Treatment (mL) 0 mL   Hemodialysis Intake (ml) 500 ml   Hemodialysis Output (ml) 3000 ml   NET Removed (ml) 2500   Tolerated Treatment Good   Patient Response to Treatment Treatment completed as ordered   Patient Disposition Return to room  ([phone])   Primary RN SBAR: Amanda Methvin, RN  Comments: net fluid removed 2.5 Liters as ordered

## 2024-06-27 NOTE — Care Coordination-Inpatient (Signed)
 06/27/2024 2:00 PM CM called to Ms. Lewis with [place] [phone]) regarding pt's Medicaid application. Ms. Ezzard reported pt's Medicaid is pending and should know by tomorrow if pt will be approved. Confirmed with Ms. Lewis she has needed financial documentation for pt's Medicaid application. Ms. Ezzard reported pt's spouse's income has to be included in pt's application even if pt is not documented.   Ms. Ezzard reported she will notify pt's daughter, Jonette of determination once final review is completed.   Ms. Ezzard was agreeable to CM following up with her tomorrow for outcome.   CM will follow up.  Rocky Pinal, BSW

## 2024-06-27 NOTE — Progress Notes (Signed)
 Hospitalist Progress Note      NAME:  Melinda [name]   DOB:  1981/01/31  MRM:  [id number]    Date/Time: 06/27/2024  8:59 AM           Assessment / Plan:     Melinda [name] is a 43 y.o. female with PMHx ESRD, T2DM with gastroparesis requiring JG tube who is admitted for dialysis.     #Acute hyperkalemia. POA,   -K5.2 => 5.6  -In setting of ESRD with need for dialysis.  -Received Lokelma , D10, insulin  in ED.  -Monitor electrolytes closely and treat as needed     Acute on Chronic volume overload 2/2 noncompliance with dialysis  -consulted nephrology to resume dialysis  -Admitted for HD inpatient until she can be set up with an outpatient clinic.   -started on HD 04/2024, had been getting it at [facility] while they are on vacation   -Case management consulted to help establish with outpatient center   -BNP 47,007  -monitor labs  -replete electrolytes as needed  -continue sodium bicarb    #Acute LFT elevation  POA, ALT 206, AST 106, alk phos 761.  These have been elevated the past but not nearly this high.  -Hepatitis panel pending  -Monitor daily LFTs  -ultrasound shows liver is grossly normal  -May need GI outpatient        # T2DM with gastroparesis. POA, chronic.  # S/p GJ tube- Required GJ tube placement for adequate nutrition.  -A1C 6.1  -SSI  -Nutrition consulted     # Elevated troponin. POA, acute on chronic  # Elevated proBNP. POA, acute on chronic  -likely falsely evaluated in the setting of ESRD.  The patient does not have symptoms consistent with ACS or heart failure at this time.  No need to trend further.    #     I have personally reviewed the radiographs, laboratory data in Epic and decisions and statements above are based partially on this personal interpretation.                 Care Plan discussed with: Patient and Family    Discussed:  Care Plan    Prophylaxis:  Hep SQ    Disposition:  Home w/Family           ___________________________________________________    Provider: Andrez JONETTA Lee, ACNP         Subjective:     Chief Complaint:  Pt just returned from dialysis. Pt reports feeling nauseous but declines meds.     ROS:  (bold if positive, if negative)    Tolerating PT  Tolerating Diet          Objective:       Vitals:          Last 24hrs VS reviewed since prior progress note. Most recent are:    Vitals:    06/27/24 0815   BP: (!) 143/99   Pulse: 62   Resp: 18   Temp: 97.3 F (36.3 C)   SpO2: 97%     SpO2 Readings from Last 6 Encounters:   06/27/24 97%   03/07/24 98%   02/23/24 94%   11/28/23 100%   11/06/23 92%   10/25/23 96%        No intake or output data in the 24 hours ending 06/27/24 0859       Exam:     Physical Exam:    Gen:  Well-developed, well-nourished, in no acute distress  HEENT:  Pink conjunctivae, PERRL, hearing intact to voice, moist mucous membranes  Neck:  Supple, without masses, thyroid non-tender  Resp:  No accessory muscle use, clear breath sounds without wheezes rales or rhonchi  Card:  No murmurs, normal S1, S2 without thrills, bruits or peripheral edema  Abd:  Soft, non-tender, non-distended, normoactive bowel sounds are present  Musc:  No cyanosis or clubbing  Skin:  No rashes or ulcers, skin turgor is good  Neuro:  Cranial nerves 3-12 are grossly intact, grip strength is 5/5 bilaterally and dorsi / plantarflexion is 5/5 bilaterally, follows commands appropriately  Psych:  Good insight, oriented to person, place and time, alert           Medications Reviewed: (see below)    Lab Data Reviewed: (see below)    ______________________________________________________________________    Medications:     Current Facility-Administered Medications   Medication Dose Route Frequency    albumin  human 25% IV solution 25 g  25 g IntraVENous PRN    lactobacillus (CULTURELLE) capsule 1 capsule  1 capsule Oral Daily with breakfast    sodium chloride  flush 0.9 % injection 5-40 mL  5-40 mL IntraVENous 2 times per day    sodium chloride  flush 0.9 % injection 5-40 mL  5-40 mL IntraVENous PRN    0.9 %  sodium chloride  infusion   IntraVENous PRN    ondansetron  (ZOFRAN -ODT) disintegrating tablet 4 mg  4 mg Oral Q8H PRN    Or    ondansetron  (ZOFRAN ) injection 4 mg  4 mg IntraVENous Q6H PRN    polyethylene glycol (GLYCOLAX ) packet 17 g  17 g Oral Daily PRN    acetaminophen  (TYLENOL ) tablet 650 mg  650 mg Oral Q6H PRN    Or    acetaminophen  (TYLENOL ) suppository 650 mg  650 mg Rectal Q6H PRN    aspirin  chewable tablet 81 mg  81 mg Oral Daily    amLODIPine  (NORVASC ) tablet 10 mg  10 mg Oral Daily    prochlorperazine  (COMPAZINE ) tablet 10 mg  10 mg Oral Q6H PRN    metoclopramide  (REGLAN ) tablet 5 mg  5 mg Oral TID AC    ondansetron  (ZOFRAN -ODT) disintegrating tablet 4 mg  4 mg Oral Q8H PRN    pantoprazole  (PROTONIX ) tablet 40 mg  40 mg Oral BID AC    sennosides-docusate sodium  (SENOKOT-S) 8.6-50 MG tablet 1 tablet  1 tablet Oral BID    clopidogrel  (PLAVIX ) tablet 75 mg  75 mg Oral Daily    busPIRone  (BUSPAR ) tablet 5 mg  5 mg Oral BID    carvedilol  (COREG ) tablet 12.5 mg  12.5 mg Oral BID WC    sodium zirconium cyclosilicate  (LOKELMA ) oral suspension 10 g  10 g PEG Tube TID    heparin  (porcine) injection 5,000 Units  5,000 Units SubCUTAneous 3 times per day    HYDROmorphone  (DILAUDID ) injection 0.25 mg  0.25 mg IntraVENous Q4H PRN    glucose chewable tablet 16 g  4 tablet Oral PRN    dextrose  bolus 10% 125 mL  125 mL IntraVENous PRN    Or    dextrose  bolus 10% 250 mL  250 mL IntraVENous PRN    glucagon  injection 1 mg  1 mg SubCUTAneous PRN    dextrose  10 % infusion   IntraVENous Continuous PRN    insulin  lispro (HUMALOG ,ADMELOG ) injection vial 0-8 Units  0-8 Units SubCUTAneous 4x Daily AC & HS    clonazePAM  (KLONOPIN ) tablet 0.5 mg  0.5 mg Oral QHS  PRN            Lab Review:     Recent Labs     06/26/24  1302 06/27/24  0510   WBC 7.1 7.1   HGB 10.6* 10.5*   HCT 34.4* 33.2*   PLT 370 382     Recent Labs     06/26/24  1302 06/27/24  0510   NA 138 137   K 5.2* 5.6*   CL 103 103   CO2 21 18*   BUN 52* 54*   PHOS  --  7.0*    ALT 206* 196*     No components found for: [facility]

## 2024-06-27 NOTE — Other (Signed)
 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

## 2024-06-28 LAB — BASIC METABOLIC PANEL
Anion Gap: 15 mmol/L — ABNORMAL HIGH (ref 2–14)
Anion Gap: 14 mmol/L (ref 2–14)
BUN/Creatinine Ratio: 7 — ABNORMAL LOW (ref 12–20)
BUN/Creatinine Ratio: 7 — ABNORMAL LOW (ref 12–20)
BUN: 18 mg/dL (ref 6–20)
BUN: 25 mg/dL — ABNORMAL HIGH (ref 6–20)
CO2: 24 mmol/L (ref 20–29)
CO2: 27 mmol/L (ref 20–29)
Calcium: 8.2 mg/dL — ABNORMAL LOW (ref 8.6–10.0)
Calcium: 8.3 mg/dL — ABNORMAL LOW (ref 8.6–10.0)
Chloride: 100 mmol/L (ref 98–107)
Chloride: 100 mmol/L (ref 98–107)
Creatinine: 2.69 mg/dL — ABNORMAL HIGH (ref 0.60–1.00)
Creatinine: 3.36 mg/dL — ABNORMAL HIGH (ref 0.60–1.00)
Est, Glom Filt Rate: 22 ml/min/1.73m2 — ABNORMAL LOW (ref >59)
Est, Glom Filt Rate: 17 ml/min/1.73m2 — ABNORMAL LOW (ref >59)
Glucose: 204 mg/dL — ABNORMAL HIGH (ref 65–100)
Glucose: 132 mg/dL — ABNORMAL HIGH (ref 65–100)
Potassium: 4.2 mmol/L (ref 3.5–5.1)
Potassium: 4.3 mmol/L (ref 3.5–5.1)
Sodium: 140 mmol/L (ref 136–145)
Sodium: 141 mmol/L (ref 136–145)

## 2024-06-28 LAB — CBC WITH AUTO DIFFERENTIAL
Basophils %: 1.4 % — ABNORMAL HIGH (ref 0.0–1.0)
Basophils Absolute: 0.11 K/UL — ABNORMAL HIGH (ref 0.00–0.10)
Eosinophils %: 8.3 % — ABNORMAL HIGH (ref 0.0–7.0)
Eosinophils Absolute: 0.65 K/UL — ABNORMAL HIGH (ref 0.00–0.40)
Hematocrit: 33.6 % — ABNORMAL LOW (ref 35.0–47.0)
Hemoglobin: 10.5 g/dL — ABNORMAL LOW (ref 11.5–16.0)
Immature Granulocytes %: 0.3 % (ref 0.0–0.5)
Immature Granulocytes Absolute: 0.02 K/UL (ref 0.00–0.04)
Lymphocytes %: 12.1 % (ref 12.0–49.0)
Lymphocytes Absolute: 0.94 K/UL (ref 0.80–3.50)
MCH: 30.1 pg (ref 26.0–34.0)
MCHC: 31.3 g/dL (ref 30.0–36.5)
MCV: 96.3 FL (ref 80.0–99.0)
MPV: 9.4 FL (ref 8.9–12.9)
Monocytes %: 8.5 % (ref 5.0–13.0)
Monocytes Absolute: 0.66 K/UL (ref 0.00–1.00)
Neutrophils %: 69.4 % (ref 32.0–75.0)
Neutrophils Absolute: 5.42 K/UL (ref 1.80–8.00)
Nucleated RBCs: 0 /100{WBCs}
Platelets: 377 K/uL (ref 150–400)
RBC: 3.49 M/uL — ABNORMAL LOW (ref 3.80–5.20)
RDW: 16.6 % — ABNORMAL HIGH (ref 11.5–14.5)
WBC: 7.8 K/uL (ref 3.6–11.0)
nRBC: 0 K/uL (ref 0.00–0.01)

## 2024-06-28 LAB — POCT GLUCOSE
POC Glucose: 251 mg/dL — ABNORMAL HIGH (ref 65–117)
POC Glucose: 149 mg/dL — ABNORMAL HIGH (ref 65–117)
POC Glucose: 302 mg/dL — ABNORMAL HIGH (ref 65–117)
POC Glucose: 151 mg/dL — ABNORMAL HIGH (ref 65–117)

## 2024-06-28 LAB — HEPATITIS C ANTIBODY: Hepatitis C Ab: NONREACTIVE

## 2024-06-28 LAB — C-REACTIVE PROTEIN: CRP: <0.3 mg/dL (ref 0.0–0.5)

## 2024-06-28 LAB — VITAMIN D 25 HYDROXY: Vit D, 25-Hydroxy: 15.2 ng/mL — ABNORMAL LOW (ref 30.0–100.0)

## 2024-06-28 LAB — HEPATIC FUNCTION PANEL
ALT: 158 U/L — ABNORMAL HIGH (ref 10–35)
AST: 61 U/L — ABNORMAL HIGH (ref 10–35)
Albumin/Globulin Ratio: 0.8 — ABNORMAL LOW (ref 1.1–2.2)
Albumin: 2.9 g/dL — ABNORMAL LOW (ref 3.5–5.2)
Alk Phosphatase: 751 U/L — ABNORMAL HIGH (ref 35–104)
Bilirubin, Direct: 0.2 mg/dL (ref 0.1–0.3)
Globulin: 3.4 g/dL (ref 2.0–4.0)
Total Bilirubin: 0.5 mg/dL (ref 0.0–1.2)
Total Protein: 6.3 g/dL — ABNORMAL LOW (ref 6.4–8.3)

## 2024-06-28 LAB — HEPATITIS B SURFACE ANTIGEN: Hepatitis B Surface Ag: NONREACTIVE

## 2024-06-28 LAB — MAGNESIUM: Magnesium: 2.1 mg/dL (ref 1.6–2.6)

## 2024-06-28 LAB — HEPATITIS B SURFACE ANTIBODY: Hep B S Ab: <3.5 m[IU]/mL

## 2024-06-28 LAB — ACETAMINOPHEN LEVEL: Acetaminophen Level: <5 ug/mL — ABNORMAL LOW (ref 10–30)

## 2024-06-28 LAB — PHOSPHORUS: Phosphorus: 5.2 mg/dL — ABNORMAL HIGH (ref 2.5–4.5)

## 2024-06-28 MED ORDER — DICLOFENAC SODIUM 1 % EX GEL
1 | Freq: Two times a day (BID) | CUTANEOUS | Status: DC
Start: 2024-06-28 — End: 2024-07-02
  Administered 2024-06-28 – 2024-07-02 (×7): 2 g via TOPICAL

## 2024-06-28 MED ORDER — SODIUM CHLORIDE (PF) 0.9 % IJ SOLN
0.9 | Freq: Two times a day (BID) | INTRAMUSCULAR | Status: DC
Start: 2024-06-28 — End: 2024-07-02
  Administered 2024-06-29 – 2024-07-02 (×7): 40 mg via INTRAVENOUS

## 2024-06-28 MED ORDER — OXYCODONE HCL 5 MG PO TABS
5 | ORAL | Status: DC | PRN
Start: 2024-06-28 — End: 2024-07-02
  Administered 2024-06-29 – 2024-07-02 (×9): 5 mg via ORAL

## 2024-06-28 MED ORDER — LIDOCAINE 4 % EX PTCH
4 | Freq: Every day | CUTANEOUS | Status: DC
Start: 2024-06-28 — End: 2024-07-02
  Administered 2024-06-28 – 2024-07-02 (×5): 1 via TRANSDERMAL

## 2024-06-28 MED ORDER — HYDROMORPHONE 0.5MG/0.5ML IJ SOLN
1 | Freq: Three times a day (TID) | Status: DC | PRN
Start: 2024-06-28 — End: 2024-06-29
  Administered 2024-06-28 – 2024-06-29 (×3): 0.25 mg via INTRAVENOUS

## 2024-06-28 MED FILL — OXYCODONE HCL 5 MG PO TABS: 5 mg | ORAL | Qty: 1 | Fill #0

## 2024-06-28 MED FILL — CULTURELLE IMMUNITY SUPPORT PO CAPS: ORAL | Qty: 1 | Fill #0

## 2024-06-28 MED FILL — SEVELAMER CARBONATE 0.8 G PO PACK: 0.8 g | ORAL | Qty: 1 | Fill #0

## 2024-06-28 MED FILL — LIDOCAINE PAIN RELIEF 4 % EX PTCH: 4 % | CUTANEOUS | Qty: 1 | Fill #0

## 2024-06-28 MED FILL — HYDRALAZINE HCL 25 MG PO TABS: 25 mg | ORAL | Qty: 2 | Fill #0

## 2024-06-28 MED FILL — DICLOFENAC SODIUM 1 % EX GEL: 1 % | CUTANEOUS | Qty: 100 | Fill #0

## 2024-06-28 MED FILL — HYDROMORPHONE HCL 1 MG/ML IJ SOLN: 1 mg/mL | INTRAMUSCULAR | Qty: 0.5 | Fill #0

## 2024-06-28 NOTE — Progress Notes (Signed)
 Spiritual Health Progress Note  [place]      Room # 535/01    Name: Melinda [name]           Age: 43 y.o.    Gender: female          MRN: [id number]  Religion: Catholic       Preferred Language: Spanish      Date: 06/28/24  Visit Time: Begin Time: 1539 End Time : 1549         Visit Summary:     Chart review. Patient has daughter named Orene and nephew named Sheree present at bedside. Patient is of the Catholic religion. Patient attends [employer] in [place]. Patient's religion is important to her and influences her morals, values, and ethics. Patient has a good family support system. Patient shared medical issues and historical records. Provided active listening, ministry of presence, compassion, and prayers of support. Patient was thankful for the visit. Please contact spiritual health services for further assistance needed.     Referral/Consult From: Rounding  Encounter Overview/Reason: Initial Encounter  Encounter Code: Encounter Code: Q9001 Assessment by chaplain services   Crisis (if applicable):    Service Provided For: Patient, Family     Patient was available.    Faith, Belief, Meaning:   Patient is connected with a faith tradition or spiritual practice  has beliefs or practices that help with coping during difficult times  Family/Friends are connected with a faith tradition or spiritual practice  Rituals (if applicable) Type:  (prayers)    Importance and Influence:  Patient has spiritual/personal beliefs that influence decisions regarding their health  Family/Friends has spiritual/personal beliefs that influence decisions regarding the patient's health    Community:  Patient   is connected with a spiritual community  indicated that they feel well-supported  Family/Friends   is connected with a spiritual community  indicated that they feel well-supported    Assessment and Plan of Care:   Emotions Expressed by Patient:   Assessment: Calm, Concerns with suffering,  Coping    Interventions by Chaplain:   Intervention: Active listening, Nurtured Hope, Prayer (assurance of)/Blessing, Discussed belief system/religious practices/faith, Discussed illness injury and it's impact, Sustaining Presence/Ministry of presence     Result/ Response by Patient:   Outcome: Acceptance, Comfort, Encouraged, Engaged in conversation, Receptive, Expressed Gratitude, Coping    Patient Plan of Care:   Plan and Referrals  Plan/Referrals: Other (Comment) (contact spiritual health services for further assistance needed.)     Emotions Expressed by Spouse/Family/Friends:   calm  fearful  frustrated  gratitude  content  thankful  worried    Chaplain Interventions with Spouse/ Family/Friends include:   active listening  prayer  provided ministry of presence    Spouse/Family/Friends Plan of Care:   Spiritual care available upon referral.      Electronically signed by      Selinda Adine Lemming, [facility]  Staff Chaplain   Spiritual Health Services  Paging Service [phone] (PRAY)

## 2024-06-28 NOTE — Care Coordination-Inpatient (Signed)
 Care Management Progress Note    Reason for Admission:   ESRD (end stage renal disease) on dialysis (HCC) [N18.6, Z99.2]         Patient Admission Status: Inpatient  RUR:  26%  Hospitalization in the last 30 days (Readmission):  No        Pt transferred from 3rd floor to the 5th. EMR reviewed and handoff received from previous case manager Edwardo).  Reportedly, pt resides with her husband and three daughters  Dtr Jonette Saupe [phone]) is the primary family contact.    Transition Plan of Care:  GI, Nephrology following for medical management  Pt is unemployed and uninsured however Medicaid is pending thru [place] [facility] (worker is Glade Kerns [phone])  ESRD - pt requires new HD chair.  [facility] will not accept until Medicaid is approved  Outpatient follow up  Family will transport pt at discharge    CM will continue to follow pt for outpt HD chair.  D.Dreon Pineda

## 2024-06-28 NOTE — Plan of Care (Cosign Needed)
 Problem: Chronic Conditions and Co-morbidities  Goal: Patient's chronic conditions and co-morbidity symptoms are monitored and maintained or improved  Outcome: Progressing     Problem: Discharge Planning  Goal: Discharge to home or other facility with appropriate resources  Outcome: Progressing     Problem: Safety - Adult  Goal: Free from fall injury  Outcome: Progressing     Problem: Pain  Goal: Verbalizes/displays adequate comfort level or baseline comfort level  Outcome: Progressing     Problem: Nutrition Deficit:  Goal: Optimize nutritional status  Outcome: Progressing

## 2024-06-28 NOTE — Care Coordination-Inpatient (Signed)
 3:34 PM  CM left a message with DSS worker Glade Kerns [phone] requesting an update - waiting on a return call. Almarie Ferrari, MSW

## 2024-06-28 NOTE — Consults (Signed)
 Dezhane Staten, PA-C                       [phone] office             Monday-Friday 8:00 am-4:30 pm  I am not permitted to use perfect serve use above for contact, thanks.      Gastroenterology Consultation Note      Admit Date: 06/26/2024  Consult Date: 06/28/2024   I greatly appreciate your asking me to see Melinda [name], thank you very much for the opportunity to participate in her care.    Narrative Assessment and Plan   43 year old female with extensive past GI workup for nausea and vomiting, now with PEG-J tube placed by [facility] for gastroparesis, being assessed by GI for elevated LFTs.  She came into the ER with need for dialysis as she has had trouble getting a set up as an outpatient.  She was started on dialysis by [facility] for ESRD.    Liver labs, admission to now: AST 106-->61. ALT 206-->158, alk phos 761-->751, total bilirubin 0.5-->0.5.  RUQ US  06/26/2024 with grossly normal liver, surgically absent gallbladder, CBD 5 mm.  Last cross-sectional imaging CT A/P without IV contrast 05/01/2024 showed unremarkable noncontrast appearance of liver, no intrahepatic or extrahepatic bile duct dilation, gallbladder surgically absent.  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    Impression:    Plan:  Appreciate viral hepatitis workup already started by primary team, will follow for findings of viral hepatitis labs, acetaminophen  level, and ANA.  Check PETH.   Check alkaline phosphatase isoenzymes.  Full serological workup ordered to rule out underlying primary liver disease.   Continue to trend LFTs and avoid hepatotoxins.     Lavanda PARAS Ellarose Brandi, PA-C    Subjective:     Reason for visit: abnormal LFTs     History of Present Illness: GI consultation requested by Dr. Dion for hepatitis.  43 year old female with significant prior GI evaluation for nausea and vomiting, admitted 06/26/2024 with ESRD. She has more recently  received her care at [facility] and has had PEG-J feeding tube placed due to gastroparesis. She is also now requiring dialysis for ESRD and was having this done MWF at [facility].  She seems to have presented to the ER due to problem with getting dialysis done as an outpatient, needing to have it done inpatient.    Initial workup showed elevated LFTs with AST 106, ALT 206, alk phos 761, total bilirubin 0.5.  These have started to improve slightly today with AST 61, ALT 158, alk phos 751, total bilirubin 0.5, direct bilirubin 0.2.  Right upper quadrant ultrasound demonstrates grossly normal liver, decreased sonographic penetration due to patient body habitus, common bile duct 5 mm diameter, gallbladder surgically absent.    Last CT A/P from 05/01/2024 with unremarkable noncontrast appearance of liver, no intrahepatic or extrahepatic bile duct dilation, gallbladder surgically absent.    LFTs have been elevated in the past as well, but this elevation is more severe than prior.    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)     Gastroparesis     Gastroparesis     GERD (gastroesophageal reflux disease)     High cholesterol     Hypertension         Past Surgical History:  Procedure Laterality Date    CAPSULE ENDOSCOPY N/A 01/20/2023    ESOPHAGEAL CAPSULE ENDOSCOPY remove at 1624PM performed by Madlyn Fendt, MD at [facility] ENDOSCOPY    CHOLECYSTECTOMY, LAPAROSCOPIC N/A 02/03/2023    ROBOTIC LAPAROSCOPIC CHOLECYSTECTOMY with Indocyanine green  performed by Chrystal Elijah BRAVO, MD at [facility] MAIN OR    COLONOSCOPY N/A 01/19/2023    COLONOSCOPY DIAGNOSTIC performed by Madlyn Fendt, MD at [facility] ENDOSCOPY    INVASIVE VASCULAR N/A 10/03/2023    Angiography visceral SMA performed by Millard Oneil LABOR, MD at [facility] CARDIAC CATH LAB    INVASIVE VASCULAR N/A 10/03/2023    Ultrasound guided vascular access performed by Millard Oneil LABOR, MD at [facility] CARDIAC  CATH LAB    INVASIVE VASCULAR N/A 10/03/2023    Insert stent peripheral artery performed by Millard Oneil LABOR, MD at [facility] CARDIAC CATH LAB    IR NONTUNNELED VASCULAR CATHETER > 5 YEARS  10/10/2023    IR NONTUNNELED VASCULAR CATHETER > 5 YEARS 10/10/2023 [facility] CARDIAC CATH/EP/IR LAB    IR NONTUNNELED VASCULAR CATHETER > 5 YEARS  10/10/2023    IR NONTUNNELED VASCULAR CATHETER > 5 YEARS 10/10/2023 [facility] CARDIAC CATH/EP/IR LAB    OTHER SURGICAL HISTORY Left     Rentia attachment    TUBAL LIGATION Bilateral     UPPER GASTROINTESTINAL ENDOSCOPY N/A 01/17/2023    ESOPHAGOGASTRODUODENOSCOPY performed by Madlyn Fendt, MD at [facility] ENDOSCOPY    UPPER GASTROINTESTINAL ENDOSCOPY N/A 01/17/2023    ESOPHAGOGASTRODUODENOSCOPY BIOPSY performed by Madlyn Fendt, MD at [facility] ENDOSCOPY    UPPER GASTROINTESTINAL ENDOSCOPY N/A 01/18/2023    ESOPHAGOGASTRODUODENOSCOPY performed by Madlyn Fendt, MD at [facility] ENDOSCOPY    UPPER GASTROINTESTINAL ENDOSCOPY N/A 05/13/2023    ESOPHAGOGASTRODUODENOSCOPY performed by Roseann Lonni BIRCH, MD at [facility] ENDOSCOPY    UPPER GASTROINTESTINAL ENDOSCOPY N/A 05/13/2023    ESOPHAGOGASTRODUODENOSCOPY BIOPSY performed by Roseann Lonni BIRCH, MD at [facility] ENDOSCOPY    UPPER GASTROINTESTINAL ENDOSCOPY N/A 09/21/2023    ESOPHAGOGASTRODUODENOSCOPY performed by Madlyn Fendt, MD at [facility] ENDOSCOPY    UPPER GASTROINTESTINAL ENDOSCOPY N/A 09/21/2023    ESOPHAGOGASTRODUODENOSCOPY BIOPSY performed by Madlyn Fendt, MD at [facility] ENDOSCOPY    US  ABSCESS DRAINAGE PERITONEAL/RETROPERITONEAL [facility]  02/11/2023    US  ABSCESS DRAINAGE PERITONEAL 02/11/2023 [facility] RAD US        Social History     Tobacco Use    Smoking status: Never    Smokeless tobacco: Never   Substance Use Topics    Alcohol use: Never        No family history on file.     No Known Allergies         Home Medications:  Prior to Admission Medications   Prescriptions Last Dose Informant Patient Reported? Taking?   DULoxetine  (CYMBALTA ) 60 MG extended release capsule Not Taking  No No   Sig: Take 1  capsule by mouth daily   Patient not taking: Reported on 06/26/2024   amLODIPine  (NORVASC ) 10 MG tablet   No No   Sig: Take 1 tablet by mouth daily   aspirin  81 MG chewable tablet   No No   Sig: Take 1 tablet by mouth daily   atorvastatin  (LIPITOR ) 80 MG tablet   Yes Yes   Sig: Take 1 tablet by mouth nightly   busPIRone  (BUSPAR ) 15 MG tablet   No No   Sig: Take 15 mg by mouth 3 times daily   Patient taking differently: Take 5 mg by mouth in the morning and at bedtime   carvedilol  (  COREG ) 12.5 MG tablet   Yes Yes   Sig: Take 1 tablet by mouth 2 times daily (with meals)   clopidogrel  (PLAVIX ) 75 MG tablet   No No   Sig: Take 1 tablet by mouth daily   dicyclomine  (BENTYL ) 20 MG tablet Not Taking  No No   Sig: Take 1 tablet by mouth every 6 hours as needed (abdominal cramping)   Patient not taking: Reported on 06/26/2024   famotidine  (PEPCID ) 20 MG tablet   Yes Yes   Sig: Take 1 tablet by mouth 2 times daily   hydrALAZINE  (APRESOLINE ) 50 MG tablet   Yes Yes   Sig: Take 1 tablet by mouth 3 times daily   linaclotide  (LINZESS ) 290 MCG CAPS capsule Not Taking  No No   Sig: Take 1 capsule by mouth every morning (before breakfast)   Patient not taking: Reported on 06/26/2024   metFORMIN  (GLUCOPHAGE -XR) 500 MG extended release tablet Not Taking  No No   Sig: Take 2 tablets by mouth daily (with breakfast)   Patient not taking: Reported on 06/26/2024   metoclopramide  (REGLAN ) 5 MG tablet   No No   Sig: Take 1 tablet by mouth 3 times daily (before meals)   metoprolol  succinate (TOPROL  XL) 50 MG extended release tablet Not Taking  No No   Sig: Take 1 tablet by mouth daily   Patient not taking: Reported on 06/26/2024   ondansetron  (ZOFRAN -ODT) 4 MG disintegrating tablet   No No   Sig: Take 1 tablet by mouth every 8 hours as needed for Nausea or Vomiting   pantoprazole  (PROTONIX ) 40 MG tablet   No No   Sig: Take 1 tablet by mouth 2 times daily (before meals)   prochlorperazine  (COMPAZINE ) 10 MG tablet   No No   Sig: Take 1 tablet by  mouth every 6 hours as needed (nausea or vomiting)   sennosides-docusate sodium  (SENOKOT-S) 8.6-50 MG tablet   No No   Sig: Take 1 tablet by mouth in the morning and at bedtime   sodium bicarbonate  650 MG tablet   No No   Sig: Take 1 tablet by mouth 3 times daily      Facility-Administered Medications: None       Hospital Medications:  Current Facility-Administered Medications   Medication Dose Route Frequency    albumin  human 25% IV solution 25 g  25 g IntraVENous PRN    lactobacillus (CULTURELLE) capsule 1 capsule  1 capsule Oral Daily with breakfast    sevelamer  (RENVELA ) packet 0.8 g  0.8 g Oral TID WC    hydrALAZINE  (APRESOLINE ) tablet 50 mg  50 mg Oral 3 times per day    sodium chloride  flush 0.9 % injection 5-40 mL  5-40 mL IntraVENous 2 times per day    sodium chloride  flush 0.9 % injection 5-40 mL  5-40 mL IntraVENous PRN    0.9 % sodium chloride  infusion   IntraVENous PRN    ondansetron  (ZOFRAN -ODT) disintegrating tablet 4 mg  4 mg Oral Q8H PRN    Or    ondansetron  (ZOFRAN ) injection 4 mg  4 mg IntraVENous Q6H PRN    polyethylene glycol (GLYCOLAX ) packet 17 g  17 g Oral Daily PRN    aspirin  chewable tablet 81 mg  81 mg Oral Daily    amLODIPine  (NORVASC ) tablet 10 mg  10 mg Oral Daily    prochlorperazine  (COMPAZINE ) tablet 10 mg  10 mg Oral Q6H PRN    metoclopramide  (REGLAN ) tablet 5  mg  5 mg Oral TID AC    ondansetron  (ZOFRAN -ODT) disintegrating tablet 4 mg  4 mg Oral Q8H PRN    pantoprazole  (PROTONIX ) tablet 40 mg  40 mg Oral BID AC    sennosides-docusate sodium  (SENOKOT-S) 8.6-50 MG tablet 1 tablet  1 tablet Oral BID    clopidogrel  (PLAVIX ) tablet 75 mg  75 mg Oral Daily    busPIRone  (BUSPAR ) tablet 5 mg  5 mg Oral BID    carvedilol  (COREG ) tablet 12.5 mg  12.5 mg Oral BID WC    heparin  (porcine) injection 5,000 Units  5,000 Units SubCUTAneous 3 times per day    HYDROmorphone  (DILAUDID ) injection 0.25 mg  0.25 mg IntraVENous Q4H PRN    glucose chewable tablet 16 g  4 tablet Oral PRN    dextrose  bolus 10%  125 mL  125 mL IntraVENous PRN    Or    dextrose  bolus 10% 250 mL  250 mL IntraVENous PRN    glucagon  injection 1 mg  1 mg SubCUTAneous PRN    dextrose  10 % infusion   IntraVENous Continuous PRN    insulin  lispro (HUMALOG ,ADMELOG ) injection vial 0-8 Units  0-8 Units SubCUTAneous 4x Daily AC & HS    clonazePAM  (KLONOPIN ) tablet 0.5 mg  0.5 mg Oral QHS PRN       Review of Systems: Admission ROS by Rodrick KATHEE Has, MD from 06/26/2024 were reviewed with the patient and changes (other than per HPI) include: none      Objective:     Physical Exam:  Vitals:    06/28/24 0909   BP: (!) 159/76   Pulse: 80   Resp: 12   Temp: 98.2 F (36.8 C)   SpO2: 95%     SpO2 Readings from Last 6 Encounters:   06/28/24 95%   03/07/24 98%   02/23/24 94%   11/28/23 100%   11/06/23 92%   10/25/23 96%          Intake/Output Summary (Last 24 hours) at 06/28/2024 9047  Last data filed at 06/27/2024 2115  Gross per 24 hour   Intake 700 ml   Output 3000 ml   Net -2300 ml        General: alert and no distress  Head: Normocephalic, without obvious abnormality, atraumatic  Eyes: anicteric sclerae and conjuntiva clear  Lungs: normal respiratory effort  Abd: PEG in place without irritation at site, bumper in proper position, not distended, soft, +mild epigastric tenderness to palpation  Ext: no cyanosis and no edema  Skin: normal skin color, no rashes, and texture normal  Neuro:  Alert and oriented  Psych: not anxious, cooperative, appropriate affect       Laboratory:    Recent Results (from the past 24 hours)   Hepatitis B Surface Antigen    Collection Time: 06/27/24  1:36 PM   Result Value Ref Range    Hepatitis B Surface Ag NONREACTIVE NR     Basic Metabolic Panel    Collection Time: 06/27/24  3:15 PM   Result Value Ref Range    Sodium 140 136 - 145 mmol/L    Potassium 4.2 3.5 - 5.1 mmol/L    Chloride 100 98 - 107 mmol/L    CO2 24 20 - 29 mmol/L    Anion Gap 15 (H) 2 - 14 mmol/L    Glucose 204 (H) 65 - 100 mg/dL    BUN 18 6 - 20 MG/DL    Creatinine 7.30 (H)  0.60 -  1.00 MG/DL    BUN/Creatinine Ratio 7 (L) 12 - 20      Est, Glom Filt Rate 22 (L) >59 ml/min/1.61m2    Calcium  8.2 (L) 8.6 - 10.0 MG/DL   POCT Glucose    Collection Time: 06/27/24  4:07 PM   Result Value Ref Range    POC Glucose 210 (H) 65 - 117 mg/dL    Performed by: Georgina Cong (PCT TRAINEE)    POCT Glucose    Collection Time: 06/27/24  6:06 PM   Result Value Ref Range    POC Glucose 194 (H) 65 - 117 mg/dL    Performed by: Karyl Peeling    POCT Glucose    Collection Time: 06/27/24  9:39 PM   Result Value Ref Range    POC Glucose 251 (H) 65 - 117 mg/dL    Performed by: Jerome Benders    Basic Metabolic Panel    Collection Time: 06/28/24  6:07 AM   Result Value Ref Range    Sodium 141 136 - 145 mmol/L    Potassium 4.3 3.5 - 5.1 mmol/L    Chloride 100 98 - 107 mmol/L    CO2 27 20 - 29 mmol/L    Anion Gap 14 2 - 14 mmol/L    Glucose 132 (H) 65 - 100 mg/dL    BUN 25 (H) 6 - 20 MG/DL    Creatinine 6.63 (H) 0.60 - 1.00 MG/DL    BUN/Creatinine Ratio 7 (L) 12 - 20      Est, Glom Filt Rate 17 (L) >59 ml/min/1.88m2    Calcium  8.3 (L) 8.6 - 10.0 MG/DL   CBC with Auto Differential    Collection Time: 06/28/24  6:07 AM   Result Value Ref Range    WBC 7.8 3.6 - 11.0 K/uL    RBC 3.49 (L) 3.80 - 5.20 M/uL    Hemoglobin 10.5 (L) 11.5 - 16.0 g/dL    Hematocrit 66.3 (L) 35.0 - 47.0 %    MCV 96.3 80.0 - 99.0 FL    MCH 30.1 26.0 - 34.0 PG    MCHC 31.3 30.0 - 36.5 g/dL    RDW 83.3 (H) 88.4 - 14.5 %    Platelets 377 150 - 400 K/uL    MPV 9.4 8.9 - 12.9 FL    Nucleated RBCs 0.0 0 PER 100 WBC    nRBC 0.00 0.00 - 0.01 K/uL    Neutrophils % 69.4 32.0 - 75.0 %    Lymphocytes % 12.1 12.0 - 49.0 %    Monocytes % 8.5 5.0 - 13.0 %    Eosinophils % 8.3 (H) 0.0 - 7.0 %    Basophils % 1.4 (H) 0.0 - 1.0 %    Immature Granulocytes % 0.3 0.0 - 0.5 %    Neutrophils Absolute 5.42 1.80 - 8.00 K/UL    Lymphocytes Absolute 0.94 0.80 - 3.50 K/UL    Monocytes Absolute 0.66 0.00 - 1.00 K/UL    Eosinophils Absolute 0.65 (H) 0.00 - 0.40 K/UL    Basophils  Absolute 0.11 (H) 0.00 - 0.10 K/UL    Immature Granulocytes Absolute 0.02 0.00 - 0.04 K/UL    Differential Type AUTOMATED     Hepatic Function Panel    Collection Time: 06/28/24  6:07 AM   Result Value Ref Range    Total Protein 6.3 (L) 6.4 - 8.3 g/dL    Albumin  2.9 (L) 3.5 - 5.2 g/dL    Globulin 3.4 2.0 - 4.0 g/dL  Albumin /Globulin Ratio 0.8 (L) 1.1 - 2.2      Total Bilirubin 0.5 0.0 - 1.2 MG/DL    Bilirubin, Direct 0.2 0.1 - 0.3 MG/DL    Alk Phosphatase 248 (H) 35 - 104 U/L    AST 61 (H) 10 - 35 U/L    ALT 158 (H) 10 - 35 U/L   Magnesium     Collection Time: 06/28/24  6:07 AM   Result Value Ref Range    Magnesium  2.1 1.6 - 2.6 mg/dL   Phosphorus    Collection Time: 06/28/24  6:07 AM   Result Value Ref Range    Phosphorus 5.2 (H) 2.5 - 4.5 MG/DL   C-Reactive Protein    Collection Time: 06/28/24  6:07 AM   Result Value Ref Range    CRP <0.3 0.0 - 0.5 mg/dL   POCT Glucose    Collection Time: 06/28/24  8:11 AM   Result Value Ref Range    POC Glucose 149 (H) 65 - 117 mg/dL    Performed by: PETRIE Mindy (CON)          Assessment/Plan:     Principal Problem:    ESRD (end stage renal disease) on dialysis (HCC)  Active Problems:    Elevated LFTs    Acute hyperkalemia  Resolved Problems:    * No resolved hospital problems. *       See above narrative for full detail.    Signed by: Melis Trochez J Angelos Wasco, PA-C 9:52 AM

## 2024-06-28 NOTE — Progress Notes (Signed)
 Brief ICU Nutrition Assessment    Type and Reason for Visit: Reassess    Nutrition Recommendations/Plan:   Brief follow up. Patient agreeable to receive feeds overnight, but reports not getting hooked up to pump last night. She continues to have intermittent nausea but is eating large bowl of strawberries and cantaloupe this AM.     Begin cyclic feeds this afternoon:  Nepro 1.8 @ 45 mL/hr x 10 hours (2200-0800)  FWF 30 mL q 4 hours around the clock      Provide adult MVI daily to meet 100% RDIs     Consumes regular diet at baseline per daughter- unclear why dysphagia diet ordered  - Soft & Bite Sized, Low K+, Low Phos  - FR 1800 mL    Last BM: 06/26/24  Edema: None                    Nutr. Labs:    Lab Results   Component Value Date    CREATININE 3.36 (H) 06/28/2024    BUN 25 (H) 06/28/2024    NA 141 06/28/2024    K 4.3 06/28/2024    CL 100 06/28/2024    CO2 27 06/28/2024       Lab Results   Component Value Date/Time    POCGLU 149 06/28/2024 08:11 AM    POCGLU 251 06/27/2024 09:39 PM    POCGLU 194 06/27/2024 06:06 PM    POCGLU 210 06/27/2024 04:07 PM    POCGLU 141 06/27/2024 08:22 AM    POCGLU 294 06/26/2024 07:43 PM        Hemoglobin A1C   Date Value Ref Range Status   06/27/2024 6.1 (H) 4.0 - 5.6 % Final     Comment:     Reference Range  Normal       <5.7%  Prediabetes  5.7-6.4%  Diabetes     >6.4%         Lab Results   Component Value Date/Time    MG 2.1 06/28/2024 06:07 AM       Lab Results   Component Value Date    CALCIUM  8.3 (L) 06/28/2024    PHOS 5.2 (H) 06/28/2024       Lab Results   Component Value Date    TRIG 155 (H) 10/12/2023    TRIG 453 (H) 09/21/2023    TRIG 346 (H) 09/01/2023       Nutr. Meds:  Scheduled Meds:   lidocaine   1 patch TransDERmal Daily    diclofenac  sodium  2 g Topical BID    lactobacillus  1 capsule Oral Daily with breakfast    sevelamer   0.8 g Oral TID WC    hydrALAZINE   50 mg Oral 3 times per day    sodium chloride  flush  5-40 mL IntraVENous 2 times per day    aspirin   81 mg Oral  Daily    amLODIPine   10 mg Oral Daily    metoclopramide   5 mg Oral TID AC    pantoprazole   40 mg Oral BID AC    sennosides-docusate sodium   1 tablet Oral BID    clopidogrel   75 mg Oral Daily    busPIRone   5 mg Oral BID    carvedilol   12.5 mg Oral BID WC    heparin  (porcine)  5,000 Units SubCUTAneous 3 times per day    insulin  lispro  0-8 Units SubCUTAneous 4x Daily AC & HS     Continuous Infusions:   sodium chloride   dextrose        PRN Meds: oxyCODONE , HYDROmorphone , albumin  human 25%, sodium chloride  flush, sodium chloride , ondansetron  **OR** ondansetron , polyethylene glycol, prochlorperazine , ondansetron , glucose, dextrose  bolus **OR** dextrose  bolus, glucagon  (rDNA), dextrose , clonazePAM     Estimated Nutrition Needs:   Energy Requirements Based On: Kcal/kg  Weight Used for Energy Requirements: Current  Energy (kcal/day): 1534-1840 (25-30 kcal/kg)  Weight Used for Protein Requirements: Current  Protein (g/day): 73-92 (1.2-1.5 g/kg)  Method Used for Fluid Requirements: 1 ml/kcal  Fluid (ml/day): 500 mL + output    Sharyne Mania, MS, RD, CNSC  Ext: [phone], or via PerfectServe

## 2024-06-28 NOTE — Progress Notes (Signed)
 Hospitalist Progress Note      NAME:  Melinda [name]   DOB:  1981/03/04  MRM:  [id number]    Date/Time: 06/28/2024  7:34 AM           Assessment / Plan:     Melinda [name] is a 43 y.o. female with PMHx ESRD, T2DM with gastroparesis requiring JG tube who is admitted for dialysis.     #Acute hyperkalemia. POA. Resolved sp dialysis  -K5.6=> 4.3  -In setting of ESRD with need for dialysis.  -Received Lokelma , D10, insulin  in ED.  -Monitor electrolytes closely and treat as needed     Acute on Chronic volume overload 2/2 noncompliance with dialysis  -consulted nephrology to resume dialysis   -resume phosp binder (Phosp 5.2),   -Admitted for HD inpatient until she can be set up with an outpatient clinic. Or pt will need to come to the ED for HD  -started on HD 04/2024, had been getting it at [facility] while they are on vacation   -Case management consulted to help establish with outpatient center   -BNP 47,007  -monitor labs  -replete electrolytes as needed  -continue sodium bicarb    #Acute LFT elevation. POA, Improving  ALT 206 => 158  AST 106 => 61  alk phos 761 => 751    These have been elevated the past but not nearly this high.  -check Vitamin D.   -Hepatitis panel pending  -Monitor daily LFTs  -ultrasound shows liver is grossly normal  -GI consulted: Check PETH, alk phosp isoenzymes, and full serological workup to rule out primary liver disease       # T2DM with gastroparesis. POA, chronic.  # S/p GJ tube- Required GJ tube placement for adequate nutrition.  -A1C 6.1  -SSI  -Nutrition consulted  -Pt refused tube feeding overnight and is eating cheetos for breakfast.  -Diabetic management consulted given continued increased glucose despite controlled A1C.     # Elevated troponin. POA, acute on chronic  # Elevated proBNP. POA, acute on chronic  -likely falsely evaluated in the setting of ESRD.  The patient does not have symptoms consistent with ACS or heart failure at this time.  No need to trend further.    # back  pain. POA.  -Pt complaining of mid to upper back pain that began 4 days ago. Pt reports pain is worse with movement and coughing. Likely musculoskeletal   -decrease frequency of available IV dilaudid .   -add lidocaine  patches, voltaren  gel and roxi PRN    I have personally reviewed the radiographs, laboratory data in Epic and decisions and statements above are based partially on this personal interpretation.                 Care Plan discussed with: Patient and Family    Discussed:  Care Plan    Prophylaxis:  Hep SQ    Disposition:  Home w/Family           ___________________________________________________    Provider: Andrez JONETTA Lee, ACNP        Subjective:     Chief Complaint:  Pt eating ice cream but complaining of nausea. Pt refusing oral medications saying they must go through her GJ tube; however, refusing feedings through GJ tube. Pt refusing oral pain meds and insisting on IV dilaudid .     ROS:  (bold if positive, if negative)    Tolerating PT  Tolerating Diet  Objective:       Vitals:          Last 24hrs VS reviewed since prior progress note. Most recent are:    Vitals:    06/28/24 0640   BP: (!) 163/70   Pulse:    Resp:    Temp:    SpO2:      SpO2 Readings from Last 6 Encounters:   06/27/24 95%   03/07/24 98%   02/23/24 94%   11/28/23 100%   11/06/23 92%   10/25/23 96%          Intake/Output Summary (Last 24 hours) at 06/28/2024 0734  Last data filed at 06/27/2024 2115  Gross per 24 hour   Intake 700 ml   Output 3000 ml   Net -2300 ml          Exam:     Physical Exam:    Gen:  Well-developed, well-nourished, in no acute distress  HEENT:  Pink conjunctivae, PERRL, hearing intact to voice, moist mucous membranes  Neck:  Supple, without masses, thyroid non-tender  Resp:  No accessory muscle use, clear breath sounds without wheezes rales or rhonchi  Card:  No murmurs, normal S1, S2 without thrills, bruits or peripheral edema  Abd:  Soft, non-tender, non-distended, normoactive bowel sounds are  present  Musc:  No cyanosis or clubbing, mid and upper back tender to palpation.   Skin:  No rashes or ulcers, skin turgor is good  Neuro:  Cranial nerves 3-12 are grossly intact, grip strength is 5/5 bilaterally and dorsi / plantarflexion is 5/5 bilaterally, follows commands appropriately  Psych:  Good insight, oriented to person, place and time, alert           Medications Reviewed: (see below)    Lab Data Reviewed: (see below)    ______________________________________________________________________    Medications:     Current Facility-Administered Medications   Medication Dose Route Frequency    albumin  human 25% IV solution 25 g  25 g IntraVENous PRN    lactobacillus (CULTURELLE) capsule 1 capsule  1 capsule Oral Daily with breakfast    sevelamer  (RENVELA ) packet 0.8 g  0.8 g Oral TID WC    hydrALAZINE  (APRESOLINE ) tablet 50 mg  50 mg Oral 3 times per day    sodium chloride  flush 0.9 % injection 5-40 mL  5-40 mL IntraVENous 2 times per day    sodium chloride  flush 0.9 % injection 5-40 mL  5-40 mL IntraVENous PRN    0.9 % sodium chloride  infusion   IntraVENous PRN    ondansetron  (ZOFRAN -ODT) disintegrating tablet 4 mg  4 mg Oral Q8H PRN    Or    ondansetron  (ZOFRAN ) injection 4 mg  4 mg IntraVENous Q6H PRN    polyethylene glycol (GLYCOLAX ) packet 17 g  17 g Oral Daily PRN    acetaminophen  (TYLENOL ) tablet 650 mg  650 mg Oral Q6H PRN    Or    acetaminophen  (TYLENOL ) suppository 650 mg  650 mg Rectal Q6H PRN    aspirin  chewable tablet 81 mg  81 mg Oral Daily    amLODIPine  (NORVASC ) tablet 10 mg  10 mg Oral Daily    prochlorperazine  (COMPAZINE ) tablet 10 mg  10 mg Oral Q6H PRN    metoclopramide  (REGLAN ) tablet 5 mg  5 mg Oral TID AC    ondansetron  (ZOFRAN -ODT) disintegrating tablet 4 mg  4 mg Oral Q8H PRN    pantoprazole  (PROTONIX ) tablet 40 mg  40 mg Oral BID AC  sennosides-docusate sodium  (SENOKOT-S) 8.6-50 MG tablet 1 tablet  1 tablet Oral BID    clopidogrel  (PLAVIX ) tablet 75 mg  75 mg Oral Daily    busPIRone   (BUSPAR ) tablet 5 mg  5 mg Oral BID    carvedilol  (COREG ) tablet 12.5 mg  12.5 mg Oral BID WC    heparin  (porcine) injection 5,000 Units  5,000 Units SubCUTAneous 3 times per day    HYDROmorphone  (DILAUDID ) injection 0.25 mg  0.25 mg IntraVENous Q4H PRN    glucose chewable tablet 16 g  4 tablet Oral PRN    dextrose  bolus 10% 125 mL  125 mL IntraVENous PRN    Or    dextrose  bolus 10% 250 mL  250 mL IntraVENous PRN    glucagon  injection 1 mg  1 mg SubCUTAneous PRN    dextrose  10 % infusion   IntraVENous Continuous PRN    insulin  lispro (HUMALOG ,ADMELOG ) injection vial 0-8 Units  0-8 Units SubCUTAneous 4x Daily AC & HS    clonazePAM  (KLONOPIN ) tablet 0.5 mg  0.5 mg Oral QHS PRN            Lab Review:     Recent Labs     06/26/24  1302 06/27/24  0510 06/28/24  0607   WBC 7.1 7.1 7.8   HGB 10.6* 10.5* 10.5*   HCT 34.4* 33.2* 33.6*   PLT 370 382 377     Recent Labs     06/26/24  1302 06/27/24  0510 06/27/24  1515 06/28/24  0607   NA 138 137 140 141   K 5.2* 5.6* 4.2 4.3   CL 103 103 100 100   CO2 21 18* 24 27   BUN 52* 54* 18 25*   MG  --   --   --  2.1   PHOS  --  7.0*  --  5.2*   ALT 206* 196*  --  158*     No components found for: [facility]

## 2024-06-28 NOTE — Progress Notes (Signed)
 NEPHROLOGY CONSULT NOTE     Patient: Melinda [name] MRN: [id number]  PCP: Marlin Slider, APRN - NP   DOB:     January 21, 1981  Age:   43 y.o.  Sex:  female      Referring physician: Rosalea Rodrick NOVAK, MD  Reason for consultation:   Admission Date: 06/26/2024 11:56 AM  LOS: 1 day        ASSESSMENT and PLAN :   AKI on CKDV requiring HD  Mild pulm edema  Mild hyperkalemia  AGMA secondary to CKD  SHPT  AOCD  IDDM with gastroparesis  Uninsured    Plan:  HD MWF with 2K bath-anticipated tomorrow  Needs OP chair-if unable to coordinate secondary to lack of insurance she will need to come to the ED for HD  Resume phosp binder  Resume Home antihypertensives  No need for EPO with current Hgb  D/w pt, daughter, HD RN         Subjective:   HPI  9/18: Has back pain. No new complaints.     Melinda [name] is a 43 y.o. Hispanic female with CKDV with AKI requiring HD in [place] about 2 months ago presenting to the ED for HD. Pt was hospitalized at [facility] for the past 2 months and was dialyzed before dc on Saturday. She initially had intractable vomiting secondary to her gastroparesis prior to initial admission, was felt to have AKI on CKD V(likely progressive CKD) and was started on HD. She has a new L AVF that is functioning. Came with a cough and mild SOB. Daughter states she has J tube (not for venting) for nutrition when she is unable to tolerate po. Daughter administers meds via J tube. She was dc without a HD chair as she has no insurance.     Pt seen on HD. Has had a mild cough and some back pain. 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 We are consulted for HD needs.     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)     Gastroparesis     Gastroparesis     GERD (gastroesophageal reflux disease)     High cholesterol     Hypertension         Past Surgical Hx:     Past Surgical  History:   Procedure Laterality Date    CAPSULE ENDOSCOPY N/A 01/20/2023    ESOPHAGEAL CAPSULE ENDOSCOPY remove at 1624PM performed by Madlyn Fendt, MD at [facility] ENDOSCOPY    CHOLECYSTECTOMY, LAPAROSCOPIC N/A 02/03/2023    ROBOTIC LAPAROSCOPIC CHOLECYSTECTOMY with Indocyanine green  performed by Chrystal Elijah BRAVO, MD at [facility] MAIN OR    COLONOSCOPY N/A 01/19/2023    COLONOSCOPY DIAGNOSTIC performed by Madlyn Fendt, MD at [facility] ENDOSCOPY    INVASIVE VASCULAR N/A 10/03/2023    Angiography visceral SMA performed by Millard Oneil LABOR, MD at [facility] CARDIAC CATH LAB    INVASIVE VASCULAR N/A 10/03/2023    Ultrasound guided vascular access performed by Millard Oneil LABOR, MD at [facility] CARDIAC CATH LAB    INVASIVE VASCULAR N/A 10/03/2023    Insert stent peripheral artery performed by Millard Oneil LABOR, MD at [facility] CARDIAC CATH LAB    IR NONTUNNELED VASCULAR CATHETER > 5 YEARS  10/10/2023    IR NONTUNNELED VASCULAR CATHETER > 5 YEARS 10/10/2023  [facility] CARDIAC CATH/EP/IR LAB    IR NONTUNNELED VASCULAR CATHETER > 5 YEARS  10/10/2023    IR NONTUNNELED VASCULAR CATHETER > 5 YEARS 10/10/2023 [facility] CARDIAC CATH/EP/IR LAB    OTHER SURGICAL HISTORY Left     Rentia attachment    TUBAL LIGATION Bilateral     UPPER GASTROINTESTINAL ENDOSCOPY N/A 01/17/2023    ESOPHAGOGASTRODUODENOSCOPY performed by Madlyn Fendt, MD at [facility] ENDOSCOPY    UPPER GASTROINTESTINAL ENDOSCOPY N/A 01/17/2023    ESOPHAGOGASTRODUODENOSCOPY BIOPSY performed by Madlyn Fendt, MD at [facility] ENDOSCOPY    UPPER GASTROINTESTINAL ENDOSCOPY N/A 01/18/2023    ESOPHAGOGASTRODUODENOSCOPY performed by Madlyn Fendt, MD at [facility] ENDOSCOPY    UPPER GASTROINTESTINAL ENDOSCOPY N/A 05/13/2023    ESOPHAGOGASTRODUODENOSCOPY performed by Roseann Lonni BIRCH, MD at [facility] ENDOSCOPY    UPPER GASTROINTESTINAL ENDOSCOPY N/A 05/13/2023    ESOPHAGOGASTRODUODENOSCOPY BIOPSY performed by Roseann Lonni BIRCH, MD at [facility] ENDOSCOPY    UPPER GASTROINTESTINAL ENDOSCOPY N/A 09/21/2023    ESOPHAGOGASTRODUODENOSCOPY performed by Madlyn Fendt,  MD at [facility] ENDOSCOPY    UPPER GASTROINTESTINAL ENDOSCOPY N/A 09/21/2023    ESOPHAGOGASTRODUODENOSCOPY BIOPSY performed by Madlyn Fendt, MD at [facility] ENDOSCOPY    US  ABSCESS DRAINAGE PERITONEAL/RETROPERITONEAL [facility]  02/11/2023    US  ABSCESS DRAINAGE PERITONEAL 02/11/2023 [facility] RAD US        Medications:  Prior to Admission medications   Medication Sig Start Date End Date Taking? Authorizing Provider   atorvastatin  (LIPITOR ) 80 MG tablet Take 1 tablet by mouth nightly   Yes [provider]   carvedilol  (COREG ) 12.5 MG tablet Take 1 tablet by mouth 2 times daily (with meals)   Yes [provider]   famotidine  (PEPCID ) 20 MG tablet Take 1 tablet by mouth 2 times daily   Yes [provider]   hydrALAZINE  (APRESOLINE ) 50 MG tablet Take 1 tablet by mouth 3 times daily   Yes [provider]   sodium bicarbonate  650 MG tablet Take 1 tablet by mouth 3 times daily 02/23/24   Muncy, Manuelita Bohr, MD   metoclopramide  (REGLAN ) 5 MG tablet Take 1 tablet by mouth 3 times daily (before meals) 02/23/24   Muncy, Manuelita Bohr, MD   pantoprazole  (PROTONIX ) 40 MG tablet Take 1 tablet by mouth 2 times daily (before meals) 02/23/24   Muncy, Manuelita Bohr, MD   sennosides-docusate sodium  (SENOKOT-S) 8.6-50 MG tablet Take 1 tablet by mouth in the morning and at bedtime 11/06/23   Do, Khoi B, MD   ondansetron  (ZOFRAN -ODT) 4 MG disintegrating tablet Take 1 tablet by mouth every 8 hours as needed for Nausea or Vomiting 11/06/23   Do, Khoi B, MD   prochlorperazine  (COMPAZINE ) 10 MG tablet Take 1 tablet by mouth every 6 hours as needed (nausea or vomiting) 10/25/23   Orson Planas, MD   metFORMIN  (GLUCOPHAGE -XR) 500 MG extended release tablet Take 2 tablets by mouth daily (with breakfast)  Patient not taking: Reported on 06/26/2024 10/25/23   Aktig, Ziya, MD   busPIRone  (BUSPAR ) 15 MG tablet Take 15 mg by mouth 3 times daily  Patient taking differently: Take 5 mg by mouth in the morning and at bedtime 10/14/23   Tefera,  Mesfin A, MD   DULoxetine  (CYMBALTA ) 60 MG extended release capsule Take 1 capsule by mouth daily  Patient not taking: Reported on 06/26/2024 10/15/23   Tefera, Mesfin A, MD   clopidogrel  (PLAVIX ) 75 MG tablet Take 1 tablet by mouth daily 10/15/23   Tefera, Mesfin A, MD   aspirin  81 MG chewable tablet  Take 1 tablet by mouth daily 10/15/23   Tefera, Mesfin A, MD   linaclotide  (LINZESS ) 290 MCG CAPS capsule Take 1 capsule by mouth every morning (before breakfast)  Patient not taking: Reported on 06/26/2024 09/23/23   Sueellen Manuelita Bohr, MD   dicyclomine  (BENTYL ) 20 MG tablet Take 1 tablet by mouth every 6 hours as needed (abdominal cramping)  Patient not taking: Reported on 06/26/2024 09/14/23   Lyell, Prentice BIRCH, MD   metoprolol  succinate (TOPROL  XL) 50 MG extended release tablet Take 1 tablet by mouth daily  Patient not taking: Reported on 06/26/2024 03/23/23   Do, Rodrick NOVAK, MD   amLODIPine  (NORVASC ) 10 MG tablet Take 1 tablet by mouth daily 03/23/23   Do, Khoi B, MD       No Known Allergies    Social Hx:  reports that she has never smoked. She has never used smokeless tobacco. She reports that she does not drink alcohol and does not use drugs.     No family history on file.    Review of Systems:  A twelve point review of system was performed today. Pertinent positives and negatives are mentioned in the HPI. The reminder of the ROS is negative and noncontributory.     Objective:    Vitals:    Vitals:    06/28/24 0618 06/28/24 0621 06/28/24 0640 06/28/24 0909   BP:  (!) 178/89 (!) 163/70 (!) 159/76   Pulse:    80   Resp: 16   12   Temp:    98.2 F (36.8 C)   TempSrc:    Oral   SpO2:    95%   Height:         I&O's:  09/17 0701 - 09/18 0700  In: 700 [P.O.:200]  Out: 3000   BP (!) 159/76   Pulse 80   Temp 98.2 F (36.8 C) (Oral)   Resp 12   Ht 1.499 m (4' 11)   SpO2 95%   BMI 25.65 kg/m     Physical Exam:    GENERAL : seated up in bed, NAD  HEENT: AT [place] PEERLA   NECK: Supple no JVP  CVS: S1 S2 RRR  RS: CTABL, no rhonchi,or  wheezing heard, not on 02  ABDOMEN: soft NT ND positive BS  EXTREMITY: No edema   NEUROLOGY: Moves all ext  HD access: LAVF    Laboratory Results:      Lab Results   Component Value Date/Time    NA 141 06/28/2024 06:07 AM    K 4.3 06/28/2024 06:07 AM    CL 100 06/28/2024 06:07 AM    CO2 27 06/28/2024 06:07 AM    BUN 25 06/28/2024 06:07 AM    CREATININE 3.36 06/28/2024 06:07 AM    GLUCOSE 132 06/28/2024 06:07 AM    CALCIUM  8.3 06/28/2024 06:07 AM          @LASTPROCAMB (POC81001;POC81003;poc81002;poc81000;POC81001F;POC81003E)@  No results found for: LABPROT, LABALBU     Lab Results   Component Value Date    WBC 7.8 06/28/2024    HGB 10.5 (L) 06/28/2024    HCT 33.6 (L) 06/28/2024    MCV 96.3 06/28/2024    PLT 377 06/28/2024    LYMPHOPCT 12.1 06/28/2024    RBC 3.49 (L) 06/28/2024    MCH 30.1 06/28/2024    MCHC 31.3 06/28/2024    RDW 16.6 (H) 06/28/2024  Damien FORBES Leos, PA-C  06/28/2024    Thank you for consulting [facility] in the care of your patient.    [facility]:  [email or web address]  [email or web address]

## 2024-06-28 NOTE — Progress Notes (Signed)
 [facility] Dosing Services: 06/28/24     Protonix  PO switched to IV Protonix  as patient with G tube for medication administration and unable to crush protonix  tablets (additionally unable to therapeutic interchange ODT Lansoprazole due to DDI with Plavix )     Pharmacy to continue to monitor patient daily.   Will make dosage adjustments based upon changing renal function.  Signed Almira  Rudd, [facility]. Contact information:  [phone]

## 2024-06-29 ENCOUNTER — Inpatient Hospital Stay: Admit: 2024-06-30 | Primary: Adult Health

## 2024-06-29 DIAGNOSIS — N186 End stage renal disease: Secondary | ICD-10-CM

## 2024-06-29 LAB — COMPREHENSIVE METABOLIC PANEL
ALT: 110 U/L — ABNORMAL HIGH (ref 10–35)
AST: 50 U/L — ABNORMAL HIGH (ref 10–35)
Albumin/Globulin Ratio: 0.7 — ABNORMAL LOW (ref 1.1–2.2)
Albumin: 2.4 g/dL — ABNORMAL LOW (ref 3.5–5.2)
Alk Phosphatase: 621 U/L — ABNORMAL HIGH (ref 35–104)
Anion Gap: 11 mmol/L (ref 2–14)
BUN/Creatinine Ratio: 8 — ABNORMAL LOW (ref 12–20)
BUN: 35 mg/dL — ABNORMAL HIGH (ref 6–20)
CO2: 23 mmol/L (ref 20–29)
Calcium: 8 mg/dL — ABNORMAL LOW (ref 8.6–10.0)
Chloride: 98 mmol/L (ref 98–107)
Creatinine: 4.29 mg/dL — ABNORMAL HIGH (ref 0.60–1.00)
Est, Glom Filt Rate: 13 ml/min/1.73m2 — ABNORMAL LOW (ref >59)
Globulin: 3.5 g/dL (ref 2.0–4.0)
Glucose: 198 mg/dL — ABNORMAL HIGH (ref 65–100)
Potassium: 4.8 mmol/L (ref 3.5–5.1)
Sodium: 132 mmol/L — ABNORMAL LOW (ref 136–145)
Total Bilirubin: 0.3 mg/dL (ref 0.0–1.2)
Total Protein: 5.9 g/dL — ABNORMAL LOW (ref 6.4–8.3)

## 2024-06-29 LAB — PHOSPHORUS: Phosphorus: 5.3 mg/dL — ABNORMAL HIGH (ref 2.5–4.5)

## 2024-06-29 LAB — CBC
Hematocrit: 28.9 % — ABNORMAL LOW (ref 35.0–47.0)
Hemoglobin: 9.2 g/dL — ABNORMAL LOW (ref 11.5–16.0)
MCH: 30.3 pg (ref 26.0–34.0)
MCHC: 31.8 g/dL (ref 30.0–36.5)
MCV: 95.1 FL (ref 80.0–99.0)
MPV: 9.4 FL (ref 8.9–12.9)
Nucleated RBCs: 0 /100{WBCs}
Platelets: 312 K/uL (ref 150–400)
RBC: 3.04 M/uL — ABNORMAL LOW (ref 3.80–5.20)
RDW: 16.3 % — ABNORMAL HIGH (ref 11.5–14.5)
WBC: 7.6 K/uL (ref 3.6–11.0)
nRBC: 0 K/uL (ref 0.00–0.01)

## 2024-06-29 LAB — POCT GLUCOSE
POC Glucose: 119 mg/dL — ABNORMAL HIGH (ref 65–117)
POC Glucose: 137 mg/dL — ABNORMAL HIGH (ref 65–117)
POC Glucose: 214 mg/dL — ABNORMAL HIGH (ref 65–117)
POC Glucose: 267 mg/dL — ABNORMAL HIGH (ref 65–117)

## 2024-06-29 LAB — FERRITIN: Ferritin: 352 ng/mL — ABNORMAL HIGH (ref 13–252)

## 2024-06-29 LAB — BRAIN NATRIURETIC PEPTIDE: NT Pro-BNP: 63337 pg/mL — ABNORMAL HIGH (ref 0–125)

## 2024-06-29 LAB — TROPONIN: Troponin T: 98 ng/L (ref 0–14)

## 2024-06-29 LAB — HEPATITIS B SURFACE ANTIBODY: Hep B S Ab: <3.5 m[IU]/mL

## 2024-06-29 LAB — VITAMIN D 25 HYDROXY: Vit D, 25-Hydroxy: 13.2 ng/mL — ABNORMAL LOW (ref 30.0–100.0)

## 2024-06-29 LAB — MAGNESIUM: Magnesium: 2 mg/dL (ref 1.6–2.6)

## 2024-06-29 LAB — HEPATITIS B CORE ANTIBODY, TOTAL: Hep B Core Total Ab: NEGATIVE

## 2024-06-29 LAB — GAMMA GT: GGT: 634 U/L — ABNORMAL HIGH (ref 5–36)

## 2024-06-29 MED ORDER — INSULIN NPH (HUMAN) (ISOPHANE) 100 UNIT/ML SC SUSP
100 | Freq: Every morning | SUBCUTANEOUS | Status: DC
Start: 2024-06-29 — End: 2024-07-02
  Administered 2024-06-30 – 2024-07-01 (×2): 5 [IU] via SUBCUTANEOUS

## 2024-06-29 MED ORDER — EPOETIN ALFA-EPBX 4000 UNIT/ML IJ SOLN
4000 | INTRAMUSCULAR | Status: DC
Start: 2024-06-29 — End: 2024-07-02
  Administered 2024-06-29: 20:00:00 6000 [IU] via SUBCUTANEOUS

## 2024-06-29 MED ORDER — PROCHLORPERAZINE MALEATE 5 MG PO TABS
5 | Freq: Four times a day (QID) | ORAL | Status: DC | PRN
Start: 2024-06-29 — End: 2024-07-02

## 2024-06-29 MED ORDER — ALPRAZOLAM 0.5 MG PO TABS
0.5 | Freq: Once | ORAL | Status: AC
Start: 2024-06-29 — End: 2024-06-29
  Administered 2024-06-29: 09:00:00 0.5 mg via ORAL

## 2024-06-29 MED ORDER — PROCHLORPERAZINE EDISYLATE 10 MG/2ML IJ SOLN
10 | Freq: Four times a day (QID) | INTRAMUSCULAR | Status: DC | PRN
Start: 2024-06-29 — End: 2024-07-02
  Administered 2024-06-29 – 2024-07-02 (×4): 10 mg via INTRAVENOUS

## 2024-06-29 MED ORDER — EPOETIN ALFA 10000 UNIT/ML IJ SOLN
10000 | INTRAMUSCULAR | Status: DC
Start: 2024-06-29 — End: 2024-06-29

## 2024-06-29 MED FILL — PANTOPRAZOLE SODIUM 40 MG IV SOLR: 40 mg | INTRAVENOUS | Qty: 40 | Fill #0

## 2024-06-29 MED FILL — HYDRALAZINE HCL 25 MG PO TABS: 25 mg | ORAL | Qty: 2 | Fill #0

## 2024-06-29 MED FILL — HYDROMORPHONE HCL 1 MG/ML IJ SOLN: 1 mg/mL | INTRAMUSCULAR | Qty: 0.5 | Fill #0

## 2024-06-29 MED FILL — RETACRIT 4000 UNIT/ML IJ SOLN: 4000 [IU]/mL | INTRAMUSCULAR | Qty: 1 | Fill #0

## 2024-06-29 MED FILL — CULTURELLE IMMUNITY SUPPORT PO CAPS: ORAL | Qty: 1 | Fill #0

## 2024-06-29 MED FILL — SEVELAMER CARBONATE 0.8 G PO PACK: 0.8 g | ORAL | Qty: 1 | Fill #0

## 2024-06-29 MED FILL — PROCHLORPERAZINE MALEATE 5 MG PO TABS: 5 mg | ORAL | Qty: 2 | Fill #0

## 2024-06-29 MED FILL — ALPRAZOLAM 0.5 MG PO TABS: 0.5 mg | ORAL | Qty: 1 | Fill #0

## 2024-06-29 MED FILL — EPOGEN 10000 UNIT/ML IJ SOLN: 10000 [IU]/mL | INTRAMUSCULAR | Qty: 0.6 | Fill #0

## 2024-06-29 MED FILL — EPOGEN 10000 UNIT/ML IJ SOLN: 10000 [IU]/mL | INTRAMUSCULAR | Qty: 1 | Fill #0

## 2024-06-29 MED FILL — LIDOCAINE PAIN RELIEF 4 % EX PTCH: 4 % | CUTANEOUS | Qty: 1 | Fill #0

## 2024-06-29 MED FILL — OXYCODONE HCL 5 MG PO TABS: 5 mg | ORAL | Qty: 1 | Fill #0

## 2024-06-29 MED FILL — PROCHLORPERAZINE EDISYLATE 10 MG/2ML IJ SOLN: 10 MG/2ML | INTRAMUSCULAR | Qty: 2 | Fill #0

## 2024-06-29 NOTE — Other (Signed)
 Hepatitis B Surface Ag   Date/Time Value Ref Range Status   06/27/2024 01:36 PM NONREACTIVE NR   Final     Hep B S Ag Interp   Date/Time Value Ref Range Status   03/16/2023 08:19 AM Negative NEG   Final     Hep B S Ab   Date/Time Value Ref Range Status   06/28/2024 06:07 AM <3.50 mIU/mL Final     Comment:        <10.00 mIU/mL     Non-Immune  =>10.00 mIU/mL    Individual is considered to be immune to infection with HBV       Hep B results, dates, and Primary Source: HepBSAg Negative (06/27/2024 Epic), HepBSAb Susceptible (06/28/2024 Epic)  Hep B dual verification performed by (RN): Montie Sloop RN with Merilee Curio RN  Machine disinfect process:Heat

## 2024-06-29 NOTE — Consults (Signed)
 Session ID: [id number]  Session Duration: 13 minutes  Language: Spanish  Interpreter ID: #[id number]  Interpreter Name: Theadore Nakai

## 2024-06-29 NOTE — Care Coordination-Inpatient (Addendum)
 06/29/24 3:49 PM  Received call back from Glade Kerns.  Per Ms. Lewis, patient's Medicaid application denied for being over income due to her husband's income.    06/29/24 12:36 PM  Phone call to Stacy with [facility] regarding Medicaid application status.  No answer, VM left.    Phone call to patient's daughter Jonette regarding Medicaid application status.  No answer, VM left.    Leonor Brunswick, MSW

## 2024-06-29 NOTE — Progress Notes (Signed)
 Tube feed ran from 2200-0500. Patient refusing to continue feed until 0800. Patient noted to have increased anxiety, Edwards NP notified at this time. 0.5 mg Xanax  PO ordered and given at this time.

## 2024-06-29 NOTE — Progress Notes (Signed)
 Melinda Derner, PA-C                       [phone] office             Monday-Friday 8:00 am-4:30 pm  I am not permitted to use perfect serve use above for contact, thanks.        Gastroenterology Progress Note    June 29, 2024  Admit Date: 06/26/2024         Narrative Assessment and Plan   43 year old Spanish-speaking female well-known to the GI service with a prior history of intractable nausea and vomiting found to have gastroparesis, inability to reliably obtain prokinetic and other GI related medications in the outpatient setting and ultimately seen at [facility] with placement of a PEG/GI to address enteral access and GI distress symptoms. The patient undergoes ESRD on hemodialysis Monday Wednesday Friday at [facility]. Unable to continue with outpatient hemodialysis at [facility] and now presents to the ED. She is found on routine labs to have rising alkaline phosphatase currently 761 from previous smoldering levels in the 200 range, low-level transaminase elevations, normal T. bili. Right upper quadrant ultrasound status post cholecystectomy, CBD 5 mm. Gallbladder explant pathology from April 2024 cholecystectomy revealed calculous cholecystitis.     06/29/24: GGT and alk phos and so enzymes in process.  MRCP ordered by primary team.  LFTs improving with AST 50, ALT 110, alk phos 621, total bilirubin 0.3.  Patient is hypertensive.  No fever or tachycardia, no leukocytosis.    Impression:  Elevated LFTs, predominantly alkaline phosphatase    Plan:  Awaiting GGT and alk phos isoenzymes to determine whether alkaline phosphatase elevation is truly from liver.  MRCP already ordered by primary team.    Melinda JINNY Peach, PA-C    Subjective:   Reason for visit: Follow-up elevated LFTs    HPI: Patient sitting up, eating breakfast at the time of visit.  Tells me she vomited this morning.  Denies abdominal pain,  fevers, chills.  We reviewed labs and discussed plan for MRI.      ROS:  The previous review of systems on initial consultation / H&P is noted and reviewed.  Specific changes noted above in HPI.    Current Medications:     Current Facility-Administered Medications   Medication Dose Route Frequency    prochlorperazine  (COMPAZINE ) injection 10 mg  10 mg IntraVENous Q6H PRN    Or    prochlorperazine  (COMPAZINE ) tablet 10 mg  10 mg Oral Q6H PRN    epoetin  alfa-epbx (RETACRIT ) 6,000 Units combo injection  6,000 Units SubCUTAneous Once per day on Monday Wednesday Friday    oxyCODONE  (ROXICODONE ) immediate release tablet 5 mg  5 mg Oral Q4H PRN    lidocaine  4 % external patch 1 patch  1 patch TransDERmal Daily    diclofenac  sodium (VOLTAREN ) 1 % gel 2 g  2 g Topical BID    pantoprazole  (PROTONIX ) 40 mg in sodium chloride  (PF) 0.9 % 10 mL injection  40 mg IntraVENous BID    albumin  human 25% IV solution 25 g  25 g IntraVENous PRN    lactobacillus (CULTURELLE) capsule 1 capsule  1 capsule Oral Daily with breakfast    sevelamer  (RENVELA ) packet 0.8 g  0.8 g Oral TID WC    hydrALAZINE  (APRESOLINE ) tablet 50 mg  50 mg Oral 3 times per day    sodium chloride  flush 0.9 % injection 5-40 mL  5-40 mL IntraVENous  2 times per day    sodium chloride  flush 0.9 % injection 5-40 mL  5-40 mL IntraVENous PRN    0.9 % sodium chloride  infusion   IntraVENous PRN    ondansetron  (ZOFRAN -ODT) disintegrating tablet 4 mg  4 mg Oral Q8H PRN    Or    ondansetron  (ZOFRAN ) injection 4 mg  4 mg IntraVENous Q6H PRN    polyethylene glycol (GLYCOLAX ) packet 17 g  17 g Oral Daily PRN    aspirin  chewable tablet 81 mg  81 mg Oral Daily    amLODIPine  (NORVASC ) tablet 10 mg  10 mg Oral Daily    metoclopramide  (REGLAN ) tablet 5 mg  5 mg Oral TID AC    ondansetron  (ZOFRAN -ODT) disintegrating tablet 4 mg  4 mg Oral Q8H PRN    sennosides-docusate sodium  (SENOKOT-S) 8.6-50 MG tablet 1 tablet  1 tablet Oral BID    clopidogrel  (PLAVIX ) tablet 75 mg  75 mg Oral Daily     busPIRone  (BUSPAR ) tablet 5 mg  5 mg Oral BID    carvedilol  (COREG ) tablet 12.5 mg  12.5 mg Oral BID WC    heparin  (porcine) injection 5,000 Units  5,000 Units SubCUTAneous 3 times per day    glucose chewable tablet 16 g  4 tablet Oral PRN    dextrose  bolus 10% 125 mL  125 mL IntraVENous PRN    Or    dextrose  bolus 10% 250 mL  250 mL IntraVENous PRN    glucagon  injection 1 mg  1 mg SubCUTAneous PRN    dextrose  10 % infusion   IntraVENous Continuous PRN    insulin  lispro (HUMALOG ,ADMELOG ) injection vial 0-8 Units  0-8 Units SubCUTAneous 4x Daily AC & HS    clonazePAM  (KLONOPIN ) tablet 0.5 mg  0.5 mg Oral QHS PRN       Objective:     VITALS:   Last 24hrs VS reviewed since prior progress note. Most recent are:  Vitals:    06/29/24 0915   BP: (!) 183/86   Pulse: 80   Resp:    Temp:    SpO2:      Temp (24hrs), Avg:98.6 F (37 C), Min:98.5 F (36.9 C), Max:98.6 F (37 C)      Intake/Output Summary (Last 24 hours) at 06/29/2024 1109  Last data filed at 06/29/2024 9487  Gross per 24 hour   Intake 545 ml   Output --   Net 545 ml       General: alert and no distress  Head: Normocephalic, without obvious abnormality, atraumatic  Eyes: anicteric sclerae and conjuntiva clear  Lungs: normal respiratory effort  Ext: no cyanosis and no edema  Skin: normal skin color, no rashes, and texture normal  Neuro:  Alert and oriented  Psych: not anxious, cooperative, appropriate affect     Lab Data Reviewed:   Recent Labs     06/27/24  0510 06/28/24  0607 06/29/24  0600   WBC 7.1 7.8 7.6   HGB 10.5* 10.5* 9.2*   HCT 33.2* 33.6* 28.9*   PLT 382 377 312     Recent Labs     06/27/24  0510 06/27/24  1515 06/28/24  0607 06/29/24  0359   NA 137 140 141 132*   K 5.6* 4.2 4.3 4.8   CL 103 100 100 98   CO2 18* 24 27 23    BUN 54* 18 25* 35*   MG  --   --  2.1 2.0   PHOS 7.0*  --  5.2* 5.3*   ALT 196*  --  158* 110*     No results found for: GLUCPOC  No results for input(s): PH, PCO2, PO2, HCO3, FIO2 in the last 72 hours.  No results  for input(s): INR in the last 72 hours.        Assessment:   (See above)  Principal Problem:    ESRD (end stage renal disease) on dialysis [facility])  Active Problems:    Elevated LFTs    Acute hyperkalemia  Resolved Problems:    * No resolved hospital problems. *      Plan:   (See above)      Signed By: Melinda JINNY Peach, PA-C     06/29/2024  11:09 AM

## 2024-06-29 NOTE — Progress Notes (Addendum)
 NEPHROLOGY CONSULT NOTE     Patient: Melinda [name] MRN: [id number]  PCP: Marlin Slider, APRN - NP   DOB:     [birth year]  Age:   43 y.o.  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)     Gastroparesis     Gastroparesis     GERD (gastroesophageal reflux disease)     High cholesterol     Hypertension         Past Surgical Hx:     Past Surgical History:   Procedure Laterality Date    CAPSULE ENDOSCOPY N/A 01/20/2023    ESOPHAGEAL CAPSULE ENDOSCOPY remove at 1624PM performed by Madlyn Fendt, MD at [facility] ENDOSCOPY    CHOLECYSTECTOMY, LAPAROSCOPIC N/A 02/03/2023    ROBOTIC LAPAROSCOPIC CHOLECYSTECTOMY with Indocyanine green  performed by Chrystal Elijah BRAVO, MD at [facility] MAIN OR    COLONOSCOPY N/A 01/19/2023    COLONOSCOPY DIAGNOSTIC performed by Madlyn Fendt, MD at [facility] ENDOSCOPY    INVASIVE VASCULAR N/A 10/03/2023    Angiography visceral SMA performed by Millard Oneil LABOR, MD at [facility] CARDIAC CATH LAB    INVASIVE VASCULAR N/A 10/03/2023    Ultrasound guided vascular access performed by Millard Oneil LABOR, MD at [facility] CARDIAC CATH LAB    INVASIVE VASCULAR N/A 10/03/2023    Insert stent peripheral artery performed by  Millard Oneil LABOR, MD at [facility] CARDIAC CATH LAB    IR NONTUNNELED VASCULAR CATHETER > 5 YEARS  10/10/2023    IR NONTUNNELED VASCULAR CATHETER > 5 YEARS 10/10/2023 [facility] CARDIAC CATH/EP/IR LAB    IR NONTUNNELED VASCULAR CATHETER > 5 YEARS  10/10/2023    IR NONTUNNELED VASCULAR CATHETER > 5 YEARS 10/10/2023 [facility] CARDIAC CATH/EP/IR LAB    OTHER SURGICAL HISTORY Left     Rentia attachment    TUBAL LIGATION Bilateral     UPPER GASTROINTESTINAL ENDOSCOPY N/A 01/17/2023    ESOPHAGOGASTRODUODENOSCOPY performed by Madlyn Fendt, MD at [facility] ENDOSCOPY    UPPER GASTROINTESTINAL ENDOSCOPY N/A 01/17/2023    ESOPHAGOGASTRODUODENOSCOPY BIOPSY performed by Madlyn Fendt, MD at [place] ENDOSCOPY    UPPER GASTROINTESTINAL ENDOSCOPY N/A 01/18/2023    ESOPHAGOGASTRODUODENOSCOPY performed by Madlyn Fendt, MD at [facility] ENDOSCOPY    UPPER GASTROINTESTINAL ENDOSCOPY N/A 05/13/2023    ESOPHAGOGASTRODUODENOSCOPY performed by Roseann Lonni BIRCH, MD at [facility] ENDOSCOPY    UPPER GASTROINTESTINAL ENDOSCOPY N/A 05/13/2023    ESOPHAGOGASTRODUODENOSCOPY BIOPSY performed by Roseann Lonni BIRCH, MD at  [facility] ENDOSCOPY    UPPER GASTROINTESTINAL ENDOSCOPY N/A 09/21/2023    ESOPHAGOGASTRODUODENOSCOPY performed by Madlyn Fendt, MD at [facility] ENDOSCOPY    UPPER GASTROINTESTINAL ENDOSCOPY N/A 09/21/2023    ESOPHAGOGASTRODUODENOSCOPY BIOPSY performed by Madlyn Fendt, MD at [facility] ENDOSCOPY    US  ABSCESS DRAINAGE PERITONEAL/RETROPERITONEAL [facility]  02/11/2023    US  ABSCESS DRAINAGE PERITONEAL 02/11/2023 [facility] RAD US        Medications:  Prior to Admission medications   Medication Sig Start Date End Date Taking? Authorizing Provider   atorvastatin  (LIPITOR ) 80 MG tablet Take 1 tablet by mouth nightly   Yes [provider]   carvedilol  (COREG ) 12.5 MG tablet Take 1 tablet by mouth 2 times daily (with meals)   Yes [provider]   famotidine  (PEPCID ) 20 MG tablet Take 1 tablet by mouth 2 times daily   Yes [provider]   hydrALAZINE  (APRESOLINE ) 50 MG tablet Take 1 tablet  by mouth 3 times daily   Yes [provider]   sodium bicarbonate  650 MG tablet Take 1 tablet by mouth 3 times daily 02/23/24   Muncy, Manuelita Bohr, MD   metoclopramide  (REGLAN ) 5 MG tablet Take 1 tablet by mouth 3 times daily (before meals) 02/23/24   Muncy, Manuelita Bohr, MD   pantoprazole  (PROTONIX ) 40 MG tablet Take 1 tablet by mouth 2 times daily (before meals) 02/23/24   Muncy, Manuelita Bohr, MD   sennosides-docusate sodium  (SENOKOT-S) 8.6-50 MG tablet Take 1 tablet by mouth in the morning and at bedtime 11/06/23   Do, Khoi B, MD   ondansetron  (ZOFRAN -ODT) 4 MG disintegrating tablet Take 1 tablet by mouth every 8 hours as needed for Nausea or Vomiting 11/06/23   Do, Khoi B, MD   prochlorperazine  (COMPAZINE ) 10 MG tablet Take 1 tablet by mouth every 6 hours as needed (nausea or vomiting) 10/25/23   Orson Planas, MD   metFORMIN  (GLUCOPHAGE -XR) 500 MG extended release tablet Take 2 tablets by mouth daily (with breakfast)  Patient not taking: Reported on 06/26/2024 10/25/23   Aktig, Ziya, MD   busPIRone  (BUSPAR ) 15 MG tablet Take 15 mg by mouth 3 times daily  Patient taking differently: Take 5 mg by mouth in the morning and at bedtime 10/14/23   Tefera, Mesfin A, MD   DULoxetine  (CYMBALTA ) 60 MG extended release capsule Take 1 capsule by mouth daily  Patient not taking: Reported on 06/26/2024 10/15/23   Tefera, Mesfin A, MD   clopidogrel  (PLAVIX ) 75 MG tablet Take 1 tablet by mouth daily 10/15/23   Tefera, Mesfin A, MD   aspirin  81 MG chewable tablet Take 1 tablet by mouth daily 10/15/23   Tefera, Mesfin A, MD   linaclotide  (LINZESS ) 290 MCG CAPS capsule Take 1 capsule by mouth every morning (before breakfast)  Patient not taking: Reported on 06/26/2024 09/23/23   Sueellen Manuelita Bohr, MD   dicyclomine  (BENTYL ) 20 MG tablet Take 1 tablet by mouth every 6 hours as needed (abdominal cramping)  Patient not taking: Reported on 06/26/2024 09/14/23   Lyell, Prentice BIRCH, MD   metoprolol  succinate (TOPROL  XL) 50 MG extended  release tablet Take 1 tablet by mouth daily  Patient not taking: Reported on 06/26/2024 03/23/23   Do, Rodrick NOVAK, MD   amLODIPine  (NORVASC ) 10 MG tablet Take 1 tablet by mouth daily 03/23/23   Do, Khoi B, MD       No Known Allergies    Social Hx:  reports that she has never smoked. She has never used  smokeless tobacco. She reports that she does not drink alcohol and does not use drugs.     No family history on file.    Review of Systems:  A twelve point review of system was performed today. Pertinent positives and negatives are mentioned in the HPI. The reminder of the ROS is negative and noncontributory.     Objective:    Vitals:    Vitals:    06/29/24 0730 06/29/24 0800 06/29/24 0820 06/29/24 0845   BP: (!) 179/87 (!) 180/86 (!) 183/83 (!) 169/74   Pulse: 85 82 93 82   Resp:       Temp:       TempSrc:       SpO2:       Height:         I&O's:  09/18 0701 - 09/19 0700  In: 545 [P.O.:200]  Out: -   BP (!) 169/74   Pulse 82   Temp 98.5 F (36.9 C)   Resp 15   Ht 1.499 m (4' 11)   SpO2 94%   BMI 25.65 kg/m     Physical Exam:    GENERAL : seated up in bed, NAD  HEENT: AT NC PEERLA   NECK: Supple no JVP  CVS: S1 S2 RRR  RS: CTABL, no rhonchi,or wheezing heard, not on 02  ABDOMEN: soft NT ND positive BS  EXTREMITY: No edema   NEUROLOGY: Moves all ext  HD access: LAVF    Laboratory Results:      Lab Results   Component Value Date/Time    NA 132 06/29/2024 03:59 AM    K 4.8 06/29/2024 03:59 AM    CL 98 06/29/2024 03:59 AM    CO2 23 06/29/2024 03:59 AM    BUN 35 06/29/2024 03:59 AM    CREATININE 4.29 06/29/2024 03:59 AM    GLUCOSE 198 06/29/2024 03:59 AM    CALCIUM  8.0 06/29/2024 03:59 AM          @LASTPROCAMB (POC81001;POC81003;poc81002;poc81000;POC81001F;POC81003E)@  No results found for: LABPROT, LABALBU     Lab Results   Component Value Date    WBC 7.6 06/29/2024    HGB 9.2 (L) 06/29/2024    HCT 28.9 (L) 06/29/2024    MCV 95.1 06/29/2024    PLT 312 06/29/2024    LYMPHOPCT 12.1 06/28/2024    RBC 3.04 (L) 06/29/2024     MCH 30.3 06/29/2024    MCHC 31.8 06/29/2024    RDW 16.3 (H) 06/29/2024                   Ashif [name], MD  06/29/2024    Thank you for consulting [facility] in the care of your patient.    [facility]:  [email or web address]  [email or web address]

## 2024-06-29 NOTE — Other (Signed)
 Primary RN SBAR: Latrelle Perkins RN  Incapacitated Nurse edu. provided: YES  Patient Education provided: TX TIME, FEARS  Preferred Education method and Primary language: ENGLISH, VERBAL  Dialysis consent: [facility] General Consent Verified: [facility] associated wait time; reason: NONE  Hepatitis B Surface Ag   Date/Time Value Ref Range Status   06/27/2024 01:36 PM NONREACTIVE NR   Final     Hep B S Ag Interp   Date/Time Value Ref Range Status   03/16/2023 08:19 AM Negative NEG   Final     Hep B S Ab   Date/Time Value Ref Range Status   06/28/2024 06:07 AM <3.50 mIU/mL Final     Comment:        <10.00 mIU/mL     Non-Immune  =>10.00 mIU/mL    Individual is considered to be immune to infection with HBV       Hep B results, dates, and Primary Source: NEGATIVE/SUSCEPTIBLE 06/27/2024  EPIC  Hep B dual verification performed by (RN): DAWN DREWRY RN  Machine disinfect process:ACID, HEAT     06/29/24 0445   Observations & Evaluations   Level of Consciousness 0   Oriented X 4   Heart Rhythm Regular   Respiratory Quality/Effort Unlabored   O2 Device None (Room air)   Bilateral Breath Sounds Clear   Skin Condition/Temp Cool   Appetite Nausea   Abdomen Inspection Other (Comment);Rounded;Soft   Vital Signs   BP (!) 183/92   Temp 98.5 F (36.9 C)   Pulse 84   Respirations 15   SpO2 94 %   Pain Assessment   Pain Assessment 0-10   Technical Checks   Dialysis Machine No. 2   RO Machine Number [id number]   Dialyzer Lot No. [id number]   Tubing Lot Number [id number]   All Connections Secure Yes   NS Bag Yes   Saline Line Double Clamped Yes   Dialyzer F-180   Prime Volume (mL) 200 mL   ICEBOAT I;C;E;B;A;O;T   RO Machine Log Sheet Completed Yes   Machine Alarm Self Test Completed;Passed   Child psychotherapist Function;pH Reading   Sport and exercise psychologist Conductivity 13.8   Manual Conductivity 13.8   Machine Ph 7.4   Manual Ph 7.4   Bleach Test (Neg) Yes   Bath Temperature 96.8 F (36 C)    Dialysis Bath   K+ (Potassium) 2   Ca+ (Calcium ) 2.5   Na+ (Sodium) 138   HCO3 (Bicarb) 35   Bicarbonate Concentrate Lot No. [id number]   Acid Concentrate Lot No. [id number]     Arrived at bed side. Pt awake and sitting up.  While prepping machine, patient having panic/anxiety attack. RN advised, reaching out to MD. Unable to access pt till she is able to settle down. Rx oredred and given at 0500 am, will wait to access when pt able to remain still.      06/29/24 0530   Hemodialysis Fistula/Graft Arteriovenous fistula Left Arm   No placement date or time found.   Present on Admission/Arrival: Yes  Access Type: Arteriovenous fistula  Orientation: Left  Access Location: Arm   Site Assessment Clean, dry & intact   Thrill Present   Bruit Present   Status Accessed   Venous Needle Size 15 G   Arterial Needle Size 15 G   Accessed By:   (Meryn Sarracino RN)   Access Attempts  1   Date of Last Dressing Change 06/27/24  Access Interventions Chlorhexidine;Aseptic Technique;Needles taped to patient   Dressing Intervention New;Dressing changed   Dressing Status Clean, dry & intact;New dressing applied     Pt still anxious, but agreeable to start tx after we talked.    Tx initiated     06/29/24 0545   Treatment   Time On 0545   Treatment Goal 2500   Vital Signs   BP (!) 150/69   Pulse 78   Treatment Initiation   Dialyze Hours 3.5   Treatment  Initiation Universal Precautions maintained;Lines secured to patient;Connections secured;Prime given;Venous Parameters set;Arterial Parameters set;IT consultant engaged;Saline line double clamped;F180   During Hemodialysis Assessment   Blood Flow Rate (ml/min) 400 ml/min   Arterial Pressure (mmHg) -180 mmHg   Venous Pressure (mmHg) 180   TMP 10   DFR 600   Comments tx started   Access Visible Yes   Ultrafiltration Rate (ml/hr) 860 ml/hr   Ultrafiltration Removed (ml) 0 ml     TX ENDED     06/29/24 0915   Treatment   Time Off 0915   Observations & Evaluations   Level of Consciousness 0    Oriented X 4   Heart Rhythm Regular   Respiratory Quality/Effort Unlabored   O2 Device None (Room air)   Bilateral Breath Sounds Clear   Skin Condition/Temp Warm   Appetite Nausea   Abdomen Inspection Other (Comment);Rounded;Soft   Edema None   Vital Signs   BP (!) 183/86   Temp 98.5 F (36.9 C)   Pulse 80   Respirations 16   SpO2 95 %   Pain Assessment   Pain Assessment 0-10   During Hemodialysis Assessment   Blood Flow Rate (ml/min) 400 ml/min   Arterial Pressure (mmHg) -180 mmHg   Venous Pressure (mmHg) 170   TMP 10   DFR 600   Access Visible Yes   Ultrafiltration Rate (ml/hr) 860 ml/hr   Ultrafiltration Removed (ml) 2500 ml   Hemodialysis Fistula/Graft Arteriovenous fistula Left Arm   No placement date or time found.   Present on Admission/Arrival: Yes  Access Type: Arteriovenous fistula  Orientation: Left  Access Location: Arm   Site Assessment Clean, dry & intact   Thrill Present   Bruit Present   Status Deaccessed   Date of Last Dressing Change 06/29/24   Access Interventions Chlorhexidine;Aseptic Technique;Needles taped to patient   Dressing Intervention New;Dressing changed   Dressing Status Clean, dry & intact;New dressing applied   Post-Hemodialysis Assessment   Post-Treatment Procedures Access bleeding time < 10 minutes   Art therapist   Rinseback Volume (ml) 300 ml   Blood Volume Processed (Liters) 76.6 L   Dialyzer Clearance Clear   Duration of Treatment (minutes) 210 minutes   Hemodialysis Intake (ml) 500 ml   Hemodialysis Output (ml) 3000 ml   NET Removed (ml) 2500   Tolerated Treatment Good   Patient Response to Treatment tolerated fair, NV issues from the start   Physician Notified No   Patient Disposition Return to room     Primary RN SBAR: Bettyann Reagin   Comments: TX COMPLETED AS ORDERED. PT FINALLY DID FALL ASLEEP, THE FEW TIMES THAT SHE WOKE UP, RESULTED IN IMMEDIATE N/V. RN NOTIFIED, MD AWARE. AT END, PT LEFT WITH CALL BELL IN REACH, SIDE  RAILS X 2 AND BED LOW AND LOCKED.

## 2024-06-29 NOTE — Care Coordination-Inpatient (Addendum)
 2:43 PM  CM received return call from pt's daughter Melinda [name]. Explained to [facility] the following: CM spoke with [facility] DSS receptionist.  Unfortunately there are no dialysis units that accept charity at this time. This was the discharge plan from [facility] in [place] dated 06/21/24- patient was told to go to the ED in [place]  for her OP HD until she could get an OP HD chair arranged. An optimal plan has not been identified at this juncture.   Melinda [name] stated that she will be here tomorrow before noon to transport her home.   No other issues or concerns at this time.   Almarie Ferrari, MSW

## 2024-06-29 NOTE — Consults (Signed)
 [place]  [facility]  DIABETES MANAGEMENT CONSULT    Consulted by Provider for advanced nursing evaluation and care for inpatient blood glucose management.    Evaluation and Action Plan   Melinda [name] is a 43 y.o. female with PMHx of T2dM, ESRD, gastroparesis, JG tube, who was admitted for needing dialysis. She was started on HD in July but had not established an HD clinic to go to regularly. Her labs on admission noted for BG 211, A1c 6.1%,K 5.2, creat 4.56, Troponin T 126.0, NT pro-BNP 47,007, ALT 206, AST 106, Hgb 10.6. CXR with pulmonary edema. She was admitted for treatment. DM consulted to assist with glycemic management.     Patient with T2DM for 18 years. Positive family history. She reports she has only ever been on insulin . She currently takes 70/30 insulin  8 units in am and 7 units at dinner. States BG 120-150 in morning and 180-200 in evening. She recently had this JG tube placed last month due to her GP. However, she reports she has not used this at home since she feels she is eating enough already. Her A1c is 6.1%, but this may be inaccurate with her ESRD and chronic anemia.     Admission BG 211. Her BG has varied with with her FBG within target so far, but then BG going up during day depending on what she is eating. She does have some toast snacks and fruit at bedside and states she only eats a little each day. Hard to tell how much she is eating from her trays (dysphagia diet). Given her home insulin  dosing and higher prandial BG, will start just a low dose NPH in the am ONLY to help cover some of this.     Blood glucose pattern    Significant diabetes-related events over the past 24-72 hours  A1C 6.1%; fructosamine pending  Fasting BG: 119  Pre-prandial: 214  Basal: 0  Bolus: 0  Correction: 10 units yesterday    Management Rationale Action Plan   Medication   Basal needs To cover diabetes needs    NPH 5 units qam      Corrective insulin  Using medium dose  sensitivity based on weight     Lab []         Hemoglobin A1c  [x]  Serum fructosamine  []  GAD antibodies   Additional orders  Nepro 1.8 cyclic feeds overnight 10pm - 8am - refusing these  Eating dysphagia diet       Diabetes Discharge Plan   Medication  Type 2 Diabetes  PTA 70/30 insulin     Referral  []         Outpatient diabetes education   Additional orders       Past Medical History     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)     Gastroparesis     Gastroparesis     GERD (gastroesophageal reflux disease)     High cholesterol     Hypertension        Hospital Course   Clinical progress has been complicated for volume overload    Diabetes History   Type Diabetes  Ambulatory BG management provided by:   Family History:    Lab Results   Component Value Date    LABA1C 6.1 (H) 06/27/2024    LABA1C 7.0 (H) 02/19/2024    LABA1C 6.8 (H) 10/24/2023  Diabetes-related Medical History    Neurological complications  Gastroparesis and Peripheral neuropathy  Microvascular disease  ESRD    Diabetes Medication History  Diabetes drug class Antihyperglycemic Agent and Dosing Additional Comments   Insulin  70/30 insulin  8 units in am and 7 units in pm      Diabetes self-management practices:   Eating pattern   [x]  Not eating a carbohydrate-controlled meal plan  [x]  Breakfast  none  [x]  Lunch  Chicken or beef or fish with beans   []  Dinner   Same as lunch    [x]  Snacks  Fruit, toast snacks  [x]  Beverages  Water  , coffee     Monitoring pattern   [x]  Testing BGs sufficiently to inform self-management adjustments  120-150 in am; 180-200 in pm    Taking medications pattern  [x]  Consistent administration  [x]  Affordable    Social determinants of health impacting diabetes self-management practices    Concerned that you need to know more about how to stay healthy with diabetes    Overall evaluation:    [x]  A1c likely not accurate; checking  fructosamine    Subjective   "I only have to use the tube feed if I don't eat enough."     Objective   Physical exam  General  Female in no acute distress. Conversant and cooperative  Neuro  Alert, oriented   Vital Signs   Vitals:    06/29/24 1250   BP: (!) 151/66   Pulse: 78   Resp: 18   Temp: 98.4 F (36.9 C)   SpO2: 96%       Laboratory  Recent Labs     06/27/24  0510 06/28/24  0607 06/29/24  0600   WBC 7.1 7.8 7.6   HGB 10.5* 10.5* 9.2*   HCT 33.2* 33.6* 28.9*   MCV 96.8 96.3 95.1   PLT 382 377 312     Recent Labs     06/27/24  0510 06/27/24  1515 06/28/24  0607 06/29/24  0359   NA 137 140 141 132*   K 5.6* 4.2 4.3 4.8   CL 103 100 100 98   CO2 18* 24 27 23    PHOS 7.0*  --  5.2* 5.3*   BUN 54* 18 25* 35*   CREATININE 4.71* 2.69* 3.36* 4.29*     Lab Results   Component Value Date    ALT 110 (H) 06/29/2024    AST 50 (H) 06/29/2024    GGT 634 (H) 06/29/2024    ALKPHOS 621 (H) 06/29/2024    BILITOT 0.3 06/29/2024     Lab Results   Component Value Date    TSH 4.91 (H) 09/18/2023         Factors impacting BG management  Factor Dose Comments   Nutrition:  Standard meals   Dysphagia diet  Nepro 1.8 cyclic feeds    Kidney function ESRD     Liver function Elevated enzymes          Before making these care recommendations, I personally reviewed the hospitalization record, including notes, laboratory & diagnostic data and current medications, and examined the patient at the bedside.  Total minutes: 55    Pranika Finks A Markese Bloxham, APRN - CNS   Diabetes Clinical Nurse Specialist   [facility]  Access via [employer]

## 2024-06-29 NOTE — Progress Notes (Signed)
 Hospitalist Progress Note      NAME:  Melinda [name]   DOB:  07/27/1981  MRM:  [id number]    Date/Time: 06/29/2024  12:36 PM           Assessment / Plan:     Melinda [name] is a 43 y.o. female with PMHx ESRD, T2DM with gastroparesis requiring JG tube who is admitted for dialysis.     #Acute LFT elevation. POA, Improving  ALT 206 => 158  AST 106 => 61  alk phos 761 => 751    These have been elevated the past but not nearly this high.  -check Vitamin D.   -Hepatitis panel pending  -Monitor daily LFTs  -ultrasound shows liver is grossly normal  -GI consulted: Check PETH, alk phosp isoenzymes, and full serological workup to rule out primary liver disease  -MRCP  to assess for choledocholithiasis as a cause of hyperdynamic LFTs;       #Acute hyperkalemia. POA. Resolved sp dialysis  -K5.6=> 4.3  -In setting of ESRD with need for dialysis.  -Received Lokelma , D10, insulin  in ED.  -Monitor electrolytes closely and treat as needed     Acute on Chronic volume overload 2/2 noncompliance with dialysis  -consulted nephrology to resume dialysis   -resume phosp binder (Phosp 5.2),   -Admitted for HD. She will need to come to the ED for HD  -started on HD 04/2024, had been getting it at [facility] while they are on vacation   -Case management consulted to help establish with outpatient center   -BNP 47,007 => 36,662  -monitor labs  -replete electrolytes as needed  -continue sodium bicarb    #Acute LFT elevation. POA, Improving  ALT 206 => 158  AST 106 => 61  alk phos 761 => 751    These have been elevated the past but not nearly this high.  -check Vitamin D.   -Hepatitis panel pending  -Monitor daily LFTs  -ultrasound shows liver is grossly normal  -GI consulted: Check PETH, alk phosp isoenzymes, and full serological workup to rule out primary liver disease       # T2DM with gastroparesis. POA, chronic.  # S/p GJ tube- Required GJ tube placement for adequate nutrition.  -A1C 6.1  -SSI  -Nutrition consulted  -Pt refused tube  feeding overnight and is eating cheetos for breakfast.  -Diabetic management consulted given continued increased glucose despite controlled A1C.     # Elevated troponin. POA, acute on chronic. Improving  # Elevated proBNP. POA, acute on chronic  -likely falsely evaluated in the setting of ESRD.  The patient does not have symptoms consistent with ACS or heart failure at this time.  No need to trend further.    # back pain. POA.  -Pt complaining of mid to upper back pain that began 4 days ago. Pt reports pain is worse with movement and coughing. Likely musculoskeletal   -dc dilaudid   -add lidocaine  patches, voltaren  gel and roxi PRN    I have personally reviewed the radiographs, laboratory data in Epic and decisions and statements above are based partially on this personal interpretation.                 Care Plan discussed with: Patient and Family    Discussed:  Care Plan    Prophylaxis:  Hep SQ    Disposition:  Home w/Family           ___________________________________________________    Provider: Andrez JONETTA [name], ACNP  Subjective:     Chief Complaint:  Pt refusing oral pain meds. Complains of vomiting but continuing to eat throughout day.   ROS:  (bold if positive, if negative)    Tolerating PT  Tolerating Diet          Objective:       Vitals:          Last 24hrs VS reviewed since prior progress note. Most recent are:    Vitals:    06/29/24 0915   BP: (!) 183/86   Pulse: 80   Resp: 16   Temp: 98.5 F (36.9 C)   SpO2: 95%     SpO2 Readings from Last 6 Encounters:   06/29/24 95%   03/07/24 98%   02/23/24 94%   11/28/23 100%   11/06/23 92%   10/25/23 96%          Intake/Output Summary (Last 24 hours) at 06/29/2024 1236  Last data filed at 06/29/2024 0915  Gross per 24 hour   Intake 1045 ml   Output 3000 ml   Net -1955 ml          Exam:     Physical Exam:    Gen:  Well-developed, well-nourished, in no acute distress  HEENT:  Pink conjunctivae, PERRL, hearing intact to voice, moist mucous membranes  Neck:  Supple,  without masses, thyroid non-tender  Resp:  No accessory muscle use, clear breath sounds without wheezes rales or rhonchi  Card:  No murmurs, normal S1, S2 without thrills, bruits or peripheral edema  Abd:  Soft, non-tender, non-distended, normoactive bowel sounds are present  Musc:  No cyanosis or clubbing, mid and upper back tender to palpation.   Skin:  No rashes or ulcers, skin turgor is good  Neuro:  Cranial nerves 3-12 are grossly intact, grip strength is 5/5 bilaterally and dorsi / plantarflexion is 5/5 bilaterally, follows commands appropriately  Psych:  Good insight, oriented to person, place and time, alert           Medications Reviewed: (see below)    Lab Data Reviewed: (see below)    ______________________________________________________________________    Medications:     Current Facility-Administered Medications   Medication Dose Route Frequency    prochlorperazine  (COMPAZINE ) injection 10 mg  10 mg IntraVENous Q6H PRN    Or    prochlorperazine  (COMPAZINE ) tablet 10 mg  10 mg Oral Q6H PRN    epoetin  alfa-epbx (RETACRIT ) 6,000 Units combo injection  6,000 Units SubCUTAneous Once per day on Monday Wednesday Friday    oxyCODONE  (ROXICODONE ) immediate release tablet 5 mg  5 mg Oral Q4H PRN    lidocaine  4 % external patch 1 patch  1 patch TransDERmal Daily    diclofenac  sodium (VOLTAREN ) 1 % gel 2 g  2 g Topical BID    pantoprazole  (PROTONIX ) 40 mg in sodium chloride  (PF) 0.9 % 10 mL injection  40 mg IntraVENous BID    albumin  human 25% IV solution 25 g  25 g IntraVENous PRN    lactobacillus (CULTURELLE) capsule 1 capsule  1 capsule Oral Daily with breakfast    sevelamer  (RENVELA ) packet 0.8 g  0.8 g Oral TID WC    hydrALAZINE  (APRESOLINE ) tablet 50 mg  50 mg Oral 3 times per day    sodium chloride  flush 0.9 % injection 5-40 mL  5-40 mL IntraVENous 2 times per day    sodium chloride  flush 0.9 % injection 5-40 mL  5-40 mL IntraVENous PRN  0.9 % sodium chloride  infusion   IntraVENous PRN    ondansetron   (ZOFRAN -ODT) disintegrating tablet 4 mg  4 mg Oral Q8H PRN    Or    ondansetron  (ZOFRAN ) injection 4 mg  4 mg IntraVENous Q6H PRN    polyethylene glycol (GLYCOLAX ) packet 17 g  17 g Oral Daily PRN    aspirin  chewable tablet 81 mg  81 mg Oral Daily    amLODIPine  (NORVASC ) tablet 10 mg  10 mg Oral Daily    metoclopramide  (REGLAN ) tablet 5 mg  5 mg Oral TID AC    ondansetron  (ZOFRAN -ODT) disintegrating tablet 4 mg  4 mg Oral Q8H PRN    sennosides-docusate sodium  (SENOKOT-S) 8.6-50 MG tablet 1 tablet  1 tablet Oral BID    clopidogrel  (PLAVIX ) tablet 75 mg  75 mg Oral Daily    busPIRone  (BUSPAR ) tablet 5 mg  5 mg Oral BID    carvedilol  (COREG ) tablet 12.5 mg  12.5 mg Oral BID WC    heparin  (porcine) injection 5,000 Units  5,000 Units SubCUTAneous 3 times per day    glucose chewable tablet 16 g  4 tablet Oral PRN    dextrose  bolus 10% 125 mL  125 mL IntraVENous PRN    Or    dextrose  bolus 10% 250 mL  250 mL IntraVENous PRN    glucagon  injection 1 mg  1 mg SubCUTAneous PRN    dextrose  10 % infusion   IntraVENous Continuous PRN    insulin  lispro (HUMALOG ,ADMELOG ) injection vial 0-8 Units  0-8 Units SubCUTAneous 4x Daily AC & HS    clonazePAM  (KLONOPIN ) tablet 0.5 mg  0.5 mg Oral QHS PRN            Lab Review:     Recent Labs     06/27/24  0510 06/28/24  0607 06/29/24  0600   WBC 7.1 7.8 7.6   HGB 10.5* 10.5* 9.2*   HCT 33.2* 33.6* 28.9*   PLT 382 377 312     Recent Labs     06/27/24  0510 06/27/24  1515 06/28/24  0607 06/29/24  0359   NA 137 140 141 132*   K 5.6* 4.2 4.3 4.8   CL 103 100 100 98   CO2 18* 24 27 23    BUN 54* 18 25* 35*   MG  --   --  2.1 2.0   PHOS 7.0*  --  5.2* 5.3*   ALT 196*  --  158* 110*     No components found for: [facility]

## 2024-06-30 LAB — COMPREHENSIVE METABOLIC PANEL
ALT: 102 U/L — ABNORMAL HIGH (ref 10–35)
AST: 54 U/L — ABNORMAL HIGH (ref 10–35)
Albumin/Globulin Ratio: 0.8 — ABNORMAL LOW (ref 1.1–2.2)
Albumin: 2.8 g/dL — ABNORMAL LOW (ref 3.5–5.2)
Alk Phosphatase: 605 U/L — ABNORMAL HIGH (ref 35–104)
Anion Gap: 10 mmol/L (ref 2–14)
BUN/Creatinine Ratio: 7 — ABNORMAL LOW (ref 12–20)
BUN: 24 mg/dL — ABNORMAL HIGH (ref 6–20)
CO2: 25 mmol/L (ref 20–29)
Calcium: 8.6 mg/dL (ref 8.6–10.0)
Chloride: 99 mmol/L (ref 98–107)
Creatinine: 3.51 mg/dL — ABNORMAL HIGH (ref 0.60–1.00)
Est, Glom Filt Rate: 16 ml/min/1.73m2 — ABNORMAL LOW (ref >59)
Globulin: 3.6 g/dL (ref 2.0–4.0)
Glucose: 190 mg/dL — ABNORMAL HIGH (ref 65–100)
Potassium: 4.7 mmol/L (ref 3.5–5.1)
Sodium: 135 mmol/L — ABNORMAL LOW (ref 136–145)
Total Bilirubin: 0.4 mg/dL (ref 0.0–1.2)
Total Protein: 6.3 g/dL — ABNORMAL LOW (ref 6.4–8.3)

## 2024-06-30 LAB — POCT GLUCOSE
POC Glucose: 302 mg/dL — ABNORMAL HIGH (ref 65–117)
POC Glucose: 127 mg/dL — ABNORMAL HIGH (ref 65–117)
POC Glucose: 194 mg/dL — ABNORMAL HIGH (ref 65–117)
POC Glucose: 160 mg/dL — ABNORMAL HIGH (ref 65–117)

## 2024-06-30 LAB — CBC
Hematocrit: 34 % — ABNORMAL LOW (ref 35.0–47.0)
Hemoglobin: 10.4 g/dL — ABNORMAL LOW (ref 11.5–16.0)
MCH: 30.3 pg (ref 26.0–34.0)
MCHC: 30.6 g/dL (ref 30.0–36.5)
MCV: 99.1 FL — ABNORMAL HIGH (ref 80.0–99.0)
MPV: 9.6 FL (ref 8.9–12.9)
Nucleated RBCs: 0 /100{WBCs}
Platelets: 297 K/uL (ref 150–400)
RBC: 3.43 M/uL — ABNORMAL LOW (ref 3.80–5.20)
RDW: 15.8 % — ABNORMAL HIGH (ref 11.5–14.5)
WBC: 7.2 K/uL (ref 3.6–11.0)
nRBC: 0 K/uL (ref 0.00–0.01)

## 2024-06-30 LAB — ANA BY IFA W/REFLEX: ANA: POSITIVE — AB

## 2024-06-30 MED ORDER — DULOXETINE HCL 30 MG PO CPEP
30 | Freq: Every day | ORAL | Status: DC
Start: 2024-06-30 — End: 2024-07-02
  Administered 2024-06-30 – 2024-07-02 (×3): 60 mg via ORAL

## 2024-06-30 MED FILL — DULOXETINE HCL 30 MG PO CPEP: 30 mg | ORAL | Qty: 2 | Fill #0

## 2024-06-30 MED FILL — SEVELAMER CARBONATE 0.8 G PO PACK: 0.8 g | ORAL | Qty: 1 | Fill #0

## 2024-06-30 MED FILL — OXYCODONE HCL 5 MG PO TABS: 5 mg | ORAL | Qty: 1 | Fill #0

## 2024-06-30 MED FILL — PANTOPRAZOLE SODIUM 40 MG IV SOLR: 40 mg | INTRAVENOUS | Qty: 40 | Fill #0

## 2024-06-30 MED FILL — PROCHLORPERAZINE EDISYLATE 10 MG/2ML IJ SOLN: 10 MG/2ML | INTRAMUSCULAR | Qty: 2 | Fill #0

## 2024-06-30 MED FILL — LIDOCAINE PAIN RELIEF 4 % EX PTCH: 4 % | CUTANEOUS | Qty: 1 | Fill #0

## 2024-06-30 MED FILL — HUMULIN N 100 UNIT/ML SC SUSP: 100 [IU]/mL | SUBCUTANEOUS | Qty: 5 | Fill #0

## 2024-06-30 MED FILL — HYDRALAZINE HCL 25 MG PO TABS: 25 mg | ORAL | Qty: 2 | Fill #0

## 2024-06-30 MED FILL — CULTURELLE IMMUNITY SUPPORT PO CAPS: ORAL | Qty: 1 | Fill #0

## 2024-06-30 NOTE — Consults (Signed)
 Session ID: [id number]  Session Duration: 5 minutes  Language: Spanish  Interpreter ID: #[id number]  Interpreter Name: Dale

## 2024-06-30 NOTE — Plan of Care (Signed)
 Problem: Chronic Conditions and Co-morbidities  Goal: Patient's chronic conditions and co-morbidity symptoms are monitored and maintained or improved  Outcome: Progressing     Problem: Discharge Planning  Goal: Discharge to home or other facility with appropriate resources  Outcome: Progressing     Problem: Safety - Adult  Goal: Free from fall injury  Outcome: Progressing     Problem: Pain  Goal: Verbalizes/displays adequate comfort level or baseline comfort level  Outcome: Progressing     Problem: Nutrition Deficit:  Goal: Optimize nutritional status  Outcome: Progressing

## 2024-06-30 NOTE — Progress Notes (Signed)
 Hospitalist Progress Note      NAME:  Melinda [name]   DOB:  March 29, 1981  MRM:  [id number]    Date/Time: 06/30/2024  11:18 AM           Assessment / Plan:     Melinda [name] is a 43 y.o. female with PMHx ESRD, T2DM with gastroparesis requiring JG tube who is admitted for dialysis.     #Acute LFT elevation. POA, Improving  ALT 206 => 158  AST 106 => 61  alk phos 761 => 751    These have been elevated the past but not nearly this high.  -Vitamin D 13.2  -Hepatitis panel neg  -ultrasound shows liver is grossly normal  -GI consulted: Check PETH, alk phosp isoenzymes, and full serological workup to rule out primary liver disease  Plan  -MRCP  to assess for choledocholithiasis as a cause of hyperdynamic LFTs; pending reading   -FU on CMP, ordered for today     #ESRD  On HD     #Acute hyperkalemia. POA. Resolved sp dialysis  -K5.6=> 4.3  -In setting of ESRD with need for dialysis.  -Received Lokelma , D10, insulin  in ED.  -Monitor electrolytes closely and treat as needed     Acute on Chronic volume overload 2/2 noncompliance with dialysis  -consulted nephrology to resume dialysis   -resume phosp binder (Phosp 5.2),   -Admitted for HD. She will need to come to the ED for HD  -started on HD 04/2024, had been getting it at [facility] [address] while they are on vacation   -Case management consulted to help establish with outpatient center   -BNP 47,007 => 36,662  -monitor labs  -replete electrolytes as needed  -continue sodium bicarb      # T2DM with gastroparesis. POA, chronic.  # S/p GJ tube- Required GJ tube placement for adequate nutrition.  -A1C 6.1  -SSI  -Nutrition consulted  -Pt refused tube feeding overnight and is eating cheetos for breakfast.  -Diabetic management consulted given continued increased glucose despite controlled A1C.     # Elevated troponin. POA, acute on chronic. Improving  # Elevated proBNP. POA, acute on chronic  -likely falsely evaluated in the setting of ESRD.  The patient does not have symptoms consistent  with ACS or heart failure at this time.  No need to trend further.    # back pain. POA.  -Pt complaining of mid to upper back pain that began 4 days ago. Pt reports pain is worse with movement and coughing. Likely musculoskeletal   -dc dilaudid   -add lidocaine  patches, voltaren  gel and roxi PRN    I have personally reviewed the radiographs, laboratory data in Epic and decisions and statements above are based partially on this personal interpretation.                 Care Plan discussed with: Patient and Family    Discussed:  Care Plan    Prophylaxis:  Hep SQ    Disposition:  Home w/Family           ___________________________________________________    Provider: Prentice Arnold, MD        Subjective:     Chief Complaint:  Pt refusing oral pain meds. Complains of vomiting but continuing to eat throughout day.   ROS:  (bold if positive, if negative)    Tolerating PT  Tolerating Diet          Objective:       Vitals:  Last 24hrs VS reviewed since prior progress note. Most recent are:    Vitals:    06/30/24 0729   BP: (!) 163/81   Pulse: 80   Resp: 14   Temp: 98.4 F (36.9 C)   SpO2: 96%     SpO2 Readings from Last 6 Encounters:   06/30/24 96%   03/07/24 98%   02/23/24 94%   11/28/23 100%   11/06/23 92%   10/25/23 96%          Intake/Output Summary (Last 24 hours) at 06/30/2024 1118  Last data filed at 06/29/2024 1931  Gross per 24 hour   Intake 730 ml   Output --   Net 730 ml          Exam:     Physical Exam:    Gen:  Well-developed, well-nourished, in no acute distress  HEENT:  Pink conjunctivae, PERRL, hearing intact to voice, moist mucous membranes  Neck:  Supple, without masses, thyroid non-tender  Resp:  No accessory muscle use, clear breath sounds without wheezes rales or rhonchi  Card:  No murmurs, normal S1, S2 without thrills, bruits or peripheral edema  Abd:  Soft, non-tender, non-distended, normoactive bowel sounds are present  Musc:  No cyanosis or clubbing, mid and upper back tender to palpation.    Skin:  No rashes or ulcers, skin turgor is good  Neuro:  Cranial nerves 3-12 are grossly intact, grip strength is 5/5 bilaterally and dorsi / plantarflexion is 5/5 bilaterally, follows commands appropriately  Psych:  Good insight, oriented to person, place and time, alert           Medications Reviewed: (see below)    Lab Data Reviewed: (see below)    ______________________________________________________________________    Medications:     Current Facility-Administered Medications   Medication Dose Route Frequency    DULoxetine  (CYMBALTA ) extended release capsule 60 mg  60 mg Oral Daily    prochlorperazine  (COMPAZINE ) injection 10 mg  10 mg IntraVENous Q6H PRN    Or    prochlorperazine  (COMPAZINE ) tablet 10 mg  10 mg Oral Q6H PRN    epoetin  alfa-epbx (RETACRIT ) 6,000 Units combo injection  6,000 Units SubCUTAneous Once per day on Monday Wednesday Friday    insulin  NPH (HumuLIN  N;NovoLIN  N) injection vial 5 Units  5 Units SubCUTAneous QAM    oxyCODONE  (ROXICODONE ) immediate release tablet 5 mg  5 mg Oral Q4H PRN    lidocaine  4 % external patch 1 patch  1 patch TransDERmal Daily    diclofenac  sodium (VOLTAREN ) 1 % gel 2 g  2 g Topical BID    pantoprazole  (PROTONIX ) 40 mg in sodium chloride  (PF) 0.9 % 10 mL injection  40 mg IntraVENous BID    albumin  human 25% IV solution 25 g  25 g IntraVENous PRN    lactobacillus (CULTURELLE) capsule 1 capsule  1 capsule Oral Daily with breakfast    sevelamer  (RENVELA ) packet 0.8 g  0.8 g Oral TID WC    hydrALAZINE  (APRESOLINE ) tablet 50 mg  50 mg Oral 3 times per day    sodium chloride  flush 0.9 % injection 5-40 mL  5-40 mL IntraVENous 2 times per day    sodium chloride  flush 0.9 % injection 5-40 mL  5-40 mL IntraVENous PRN    0.9 % sodium chloride  infusion   IntraVENous PRN    ondansetron  (ZOFRAN -ODT) disintegrating tablet 4 mg  4 mg Oral Q8H PRN    Or    ondansetron  (ZOFRAN ) injection  4 mg  4 mg IntraVENous Q6H PRN    polyethylene glycol (GLYCOLAX ) packet 17 g  17 g Oral Daily  PRN    aspirin  chewable tablet 81 mg  81 mg Oral Daily    amLODIPine  (NORVASC ) tablet 10 mg  10 mg Oral Daily    metoclopramide  (REGLAN ) tablet 5 mg  5 mg Oral TID AC    ondansetron  (ZOFRAN -ODT) disintegrating tablet 4 mg  4 mg Oral Q8H PRN    sennosides-docusate sodium  (SENOKOT-S) 8.6-50 MG tablet 1 tablet  1 tablet Oral BID    clopidogrel  (PLAVIX ) tablet 75 mg  75 mg Oral Daily    busPIRone  (BUSPAR ) tablet 5 mg  5 mg Oral BID    carvedilol  (COREG ) tablet 12.5 mg  12.5 mg Oral BID WC    heparin  (porcine) injection 5,000 Units  5,000 Units SubCUTAneous 3 times per day    glucose chewable tablet 16 g  4 tablet Oral PRN    dextrose  bolus 10% 125 mL  125 mL IntraVENous PRN    Or    dextrose  bolus 10% 250 mL  250 mL IntraVENous PRN    glucagon  injection 1 mg  1 mg SubCUTAneous PRN    dextrose  10 % infusion   IntraVENous Continuous PRN    insulin  lispro (HUMALOG ,ADMELOG ) injection vial 0-8 Units  0-8 Units SubCUTAneous 4x Daily AC & HS    clonazePAM  (KLONOPIN ) tablet 0.5 mg  0.5 mg Oral QHS PRN            Lab Review:     Recent Labs     06/28/24  0607 06/29/24  0600   WBC 7.8 7.6   HGB 10.5* 9.2*   HCT 33.6* 28.9*   PLT 377 312     Recent Labs     06/27/24  1515 06/28/24  0607 06/29/24  0359   NA 140 141 132*   K 4.2 4.3 4.8   CL 100 100 98   CO2 24 27 23    BUN 18 25* 35*   MG  --  2.1 2.0   PHOS  --  5.2* 5.3*   ALT  --  158* 110*     No components found for: [facility]

## 2024-07-01 ENCOUNTER — Inpatient Hospital Stay: Admit: 2024-07-01 | Primary: Adult Health

## 2024-07-01 LAB — COMPREHENSIVE METABOLIC PANEL
ALT: 88 U/L — ABNORMAL HIGH (ref 10–35)
AST: 35 U/L (ref 10–35)
Albumin/Globulin Ratio: 0.8 — ABNORMAL LOW (ref 1.1–2.2)
Albumin: 2.9 g/dL — ABNORMAL LOW (ref 3.5–5.2)
Alk Phosphatase: 579 U/L — ABNORMAL HIGH (ref 35–104)
Anion Gap: 13 mmol/L (ref 2–14)
BUN/Creatinine Ratio: 7 — ABNORMAL LOW (ref 12–20)
BUN: 30 mg/dL — ABNORMAL HIGH (ref 6–20)
CO2: 24 mmol/L (ref 20–29)
Calcium: 8.8 mg/dL (ref 8.6–10.0)
Chloride: 102 mmol/L (ref 98–107)
Creatinine: 4.14 mg/dL — ABNORMAL HIGH (ref 0.60–1.00)
Est, Glom Filt Rate: 13 ml/min/1.73m2 — ABNORMAL LOW (ref >59)
Globulin: 3.6 g/dL (ref 2.0–4.0)
Glucose: 137 mg/dL — ABNORMAL HIGH (ref 65–100)
Potassium: 5.2 mmol/L — ABNORMAL HIGH (ref 3.5–5.1)
Sodium: 138 mmol/L (ref 136–145)
Total Bilirubin: 0.4 mg/dL (ref 0.0–1.2)
Total Protein: 6.5 g/dL (ref 6.4–8.3)

## 2024-07-01 LAB — POCT GLUCOSE
POC Glucose: 208 mg/dL — ABNORMAL HIGH (ref 65–117)
POC Glucose: 149 mg/dL — ABNORMAL HIGH (ref 65–117)
POC Glucose: 172 mg/dL — ABNORMAL HIGH (ref 65–117)
POC Glucose: 234 mg/dL — ABNORMAL HIGH (ref 65–117)

## 2024-07-01 LAB — LIPASE: Lipase: 11 U/L — ABNORMAL LOW (ref 13–60)

## 2024-07-01 LAB — LACTIC ACID: Lactic Acid: 0.7 mmol/L (ref 0.5–2.0)

## 2024-07-01 LAB — CBC
Hematocrit: 32.4 % — ABNORMAL LOW (ref 35.0–47.0)
Hemoglobin: 10.1 g/dL — ABNORMAL LOW (ref 11.5–16.0)
MCH: 30 pg (ref 26.0–34.0)
MCHC: 31.2 g/dL (ref 30.0–36.5)
MCV: 96.1 FL (ref 80.0–99.0)
MPV: 9.3 FL (ref 8.9–12.9)
Nucleated RBCs: 0 /100{WBCs}
Platelets: 306 K/uL (ref 150–400)
RBC: 3.37 M/uL — ABNORMAL LOW (ref 3.80–5.20)
RDW: 15.9 % — ABNORMAL HIGH (ref 11.5–14.5)
WBC: 7.3 K/uL (ref 3.6–11.0)
nRBC: 0 K/uL (ref 0.00–0.01)

## 2024-07-01 MED ORDER — LIDOCAINE VISCOUS HCL 2 % MT SOLN
2 | Freq: Once | OROMUCOSAL | Status: AC
Start: 2024-07-01 — End: 2024-07-01
  Administered 2024-07-01: 12:00:00 40 mL via ORAL

## 2024-07-01 MED FILL — HUMULIN N 100 UNIT/ML SC SUSP: 100 [IU]/mL | SUBCUTANEOUS | Qty: 5 | Fill #0

## 2024-07-01 MED FILL — DULOXETINE HCL 30 MG PO CPEP: 30 mg | ORAL | Qty: 2 | Fill #0

## 2024-07-01 MED FILL — OXYCODONE HCL 5 MG PO TABS: 5 mg | ORAL | Qty: 1 | Fill #0

## 2024-07-01 MED FILL — HYDRALAZINE HCL 25 MG PO TABS: 25 mg | ORAL | Qty: 2 | Fill #0

## 2024-07-01 MED FILL — PROCHLORPERAZINE EDISYLATE 10 MG/2ML IJ SOLN: 10 MG/2ML | INTRAMUSCULAR | Qty: 2 | Fill #0

## 2024-07-01 MED FILL — PANTOPRAZOLE SODIUM 40 MG IV SOLR: 40 mg | INTRAVENOUS | Qty: 40 | Fill #0

## 2024-07-01 MED FILL — MAG-AL PLUS 200-200-20 MG/5ML PO LIQD: 200-200-20 MG/5ML | ORAL | Qty: 30 | Fill #0

## 2024-07-01 MED FILL — SEVELAMER CARBONATE 0.8 G PO PACK: 0.8 g | ORAL | Qty: 1 | Fill #0

## 2024-07-01 MED FILL — CULTURELLE IMMUNITY SUPPORT PO CAPS: ORAL | Qty: 1 | Fill #0

## 2024-07-01 MED FILL — LIDOCAINE PAIN RELIEF 4 % EX PTCH: 4 % | CUTANEOUS | Qty: 1 | Fill #0

## 2024-07-01 NOTE — Plan of Care (Signed)
 Problem: Chronic Conditions and Co-morbidities  Goal: Patient's chronic conditions and co-morbidity symptoms are monitored and maintained or improved  Outcome: Progressing     Problem: Discharge Planning  Goal: Discharge to home or other facility with appropriate resources  Outcome: Progressing     Problem: Safety - Adult  Goal: Free from fall injury  Outcome: Progressing     Problem: Pain  Goal: Verbalizes/displays adequate comfort level or baseline comfort level  Outcome: Progressing     Problem: Nutrition Deficit:  Goal: Optimize nutritional status  Outcome: Progressing

## 2024-07-01 NOTE — Consults (Signed)
 Session ID: [id number]  Session Duration: 6 minutes  Language: Spanish  Interpreter ID: #[id number]  Interpreter Name: Curlee

## 2024-07-01 NOTE — Progress Notes (Addendum)
 Hospitalist Progress Note      NAME:  Melinda [name]   DOB:  1981-02-07  MRM:  [id number]    Date/Time: 07/01/2024  9:26 AM           Assessment / Plan:     Melinda [name] is a 43 y.o. female with PMHx ESRD, T2DM with gastroparesis requiring JG tube who is admitted for dialysis.       #Abdominal pain  Diffuse abdominal pain patient reporting today  Normal bowel movements with last bowel movement yesterday  LFTs trending down  Lactic 0.7  Lipase 11  Plan  Repeat CT abdomen pelvis, however MRCP was done on the 19th that was negative      #Acute LFT elevation. POA, improved  LFTs trending down normal T. bili  -Vitamin D 13.2  -Hepatitis panel neg  -ultrasound shows liver is grossly normal  - MRCP: The heart is enlarged. There is a moderate right pleural effusion, trace left  pleural effusion, and a small pericardial effusion, and a small volume of  ascites. A percutaneous gastrostomy tube is in appropriate position. No  pancreatic or biliary ductal dilation.     The unenhanced distal esophagus, stomach, duodenum, liver, pancreas, spleen,  adrenals, and visualized portions of the bowel, are normal. Post  cholecystectomy. Mild renal atrophy.  -GI on board appreciate further recommendations    #ESRD  On HD   Plan for HD tomorrow prior to discharge    #Acute hyperkalemia. POA. Resolved sp dialysis  -K5.6=> 4.3  -In setting of ESRD with need for dialysis.  -Received Lokelma , D10, insulin  in ED.  -Monitor electrolytes closely and treat as needed   Nephrology on board    Acute on Chronic volume overload 2/2 noncompliance with dialysis  -consulted nephrology to resume dialysis   -resume phosp binder (Phosp 5.2),   -Admitted for HD. She will need to come to the ED for HD  -started on HD 04/2024, had been getting it at [facility] while they are on vacation   -Case management consulted to help establish with outpatient center   -BNP 47,007 => 36,662  -monitor labs  -replete electrolytes as needed  -continue sodium bicarb      #  T2DM with gastroparesis. POA, chronic.  # S/p GJ tube- Required GJ tube placement for adequate nutrition.  -A1C 6.1  -SSI  -Nutrition consulted  -Pt refused tube feeding overnight and is eating cheetos for breakfast.  -Diabetic management consulted given continued increased glucose despite controlled A1C.     # Elevated troponin. POA, acute on chronic. Improving  # Elevated proBNP. POA, acute on chronic  -likely falsely evaluated in the setting of ESRD.  The patient does not have symptoms consistent with ACS or heart failure at this time.  No need to trend further.    # back pain. POA.  -Pt complaining of mid to upper back pain that began 4 days ago. Pt reports pain is worse with movement and coughing. Likely musculoskeletal   -dc dilaudid   -add lidocaine  patches, voltaren  gel and roxi PRN      I have personally reviewed the radiographs, laboratory data in Epic and decisions and statements above are based partially on this personal interpretation.                 Care Plan discussed with: Patient and Family    Discussed:  Care Plan    Prophylaxis:  Hep SQ    Disposition:  Home w/Family  ___________________________________________________    Provider: Prentice Arnold, MD        Subjective:     Chief Complaint:  Pt refusing oral pain meds. Complains of vomiting but continuing to eat throughout day.   ROS:  (bold if positive, if negative)    Tolerating PT  Tolerating Diet          Objective:       Vitals:          Last 24hrs VS reviewed since prior progress note. Most recent are:    Vitals:    07/01/24 0736   BP: (!) 157/70   Pulse: 79   Resp: 14   Temp: 98.6 F (37 C)   SpO2: 93%     SpO2 Readings from Last 6 Encounters:   07/01/24 93%   03/07/24 98%   02/23/24 94%   11/28/23 100%   11/06/23 92%   10/25/23 96%          Intake/Output Summary (Last 24 hours) at 07/01/2024 0926  Last data filed at 06/30/2024 1645  Gross per 24 hour   Intake 720 ml   Output --   Net 720 ml          Exam:     Physical Exam:    Gen:   Well-developed, well-nourished, in no acute distress  HEENT:  Pink conjunctivae, PERRL, hearing intact to voice, moist mucous membranes  Neck:  Supple, without masses, thyroid non-tender  Resp:  No accessory muscle use, clear breath sounds without wheezes rales or rhonchi  Card:  No murmurs, normal S1, S2 without thrills, bruits or peripheral edema  Abd:  Soft, non-tender, non-distended, normoactive bowel sounds are present  Musc:  No cyanosis or clubbing, mid and upper back tender to palpation.   Skin:  No rashes or ulcers, skin turgor is good  Neuro:  Cranial nerves 3-12 are grossly intact, grip strength is 5/5 bilaterally and dorsi / plantarflexion is 5/5 bilaterally, follows commands appropriately  Psych:  Good insight, oriented to person, place and time, alert           Medications Reviewed: (see below)    Lab Data Reviewed: (see below)    ______________________________________________________________________    Medications:     Current Facility-Administered Medications   Medication Dose Route Frequency    DULoxetine  (CYMBALTA ) extended release capsule 60 mg  60 mg Oral Daily    prochlorperazine  (COMPAZINE ) injection 10 mg  10 mg IntraVENous Q6H PRN    Or    prochlorperazine  (COMPAZINE ) tablet 10 mg  10 mg Oral Q6H PRN    epoetin  alfa-epbx (RETACRIT ) 6,000 Units combo injection  6,000 Units SubCUTAneous Once per day on Monday Wednesday Friday    insulin  NPH (HumuLIN  N;NovoLIN  N) injection vial 5 Units  5 Units SubCUTAneous QAM    oxyCODONE  (ROXICODONE ) immediate release tablet 5 mg  5 mg Oral Q4H PRN    lidocaine  4 % external patch 1 patch  1 patch TransDERmal Daily    diclofenac  sodium (VOLTAREN ) 1 % gel 2 g  2 g Topical BID    pantoprazole  (PROTONIX ) 40 mg in sodium chloride  (PF) 0.9 % 10 mL injection  40 mg IntraVENous BID    albumin  human 25% IV solution 25 g  25 g IntraVENous PRN    lactobacillus (CULTURELLE) capsule 1 capsule  1 capsule Oral Daily with breakfast    sevelamer  (RENVELA ) packet 0.8 g  0.8 g  Oral TID WC    hydrALAZINE  (APRESOLINE ) tablet 50  mg  50 mg Oral 3 times per day    sodium chloride  flush 0.9 % injection 5-40 mL  5-40 mL IntraVENous 2 times per day    sodium chloride  flush 0.9 % injection 5-40 mL  5-40 mL IntraVENous PRN    0.9 % sodium chloride  infusion   IntraVENous PRN    ondansetron  (ZOFRAN -ODT) disintegrating tablet 4 mg  4 mg Oral Q8H PRN    Or    ondansetron  (ZOFRAN ) injection 4 mg  4 mg IntraVENous Q6H PRN    polyethylene glycol (GLYCOLAX ) packet 17 g  17 g Oral Daily PRN    aspirin  chewable tablet 81 mg  81 mg Oral Daily    amLODIPine  (NORVASC ) tablet 10 mg  10 mg Oral Daily    metoclopramide  (REGLAN ) tablet 5 mg  5 mg Oral TID AC    ondansetron  (ZOFRAN -ODT) disintegrating tablet 4 mg  4 mg Oral Q8H PRN    sennosides-docusate sodium  (SENOKOT-S) 8.6-50 MG tablet 1 tablet  1 tablet Oral BID    clopidogrel  (PLAVIX ) tablet 75 mg  75 mg Oral Daily    busPIRone  (BUSPAR ) tablet 5 mg  5 mg Oral BID    carvedilol  (COREG ) tablet 12.5 mg  12.5 mg Oral BID WC    heparin  (porcine) injection 5,000 Units  5,000 Units SubCUTAneous 3 times per day    glucose chewable tablet 16 g  4 tablet Oral PRN    dextrose  bolus 10% 125 mL  125 mL IntraVENous PRN    Or    dextrose  bolus 10% 250 mL  250 mL IntraVENous PRN    glucagon  injection 1 mg  1 mg SubCUTAneous PRN    dextrose  10 % infusion   IntraVENous Continuous PRN    insulin  lispro (HUMALOG ,ADMELOG ) injection vial 0-8 Units  0-8 Units SubCUTAneous 4x Daily AC & HS    clonazePAM  (KLONOPIN ) tablet 0.5 mg  0.5 mg Oral QHS PRN            Lab Review:     Recent Labs     06/29/24  0600 06/30/24  1202 07/01/24  0529   WBC 7.6 7.2 7.3   HGB 9.2* 10.4* 10.1*   HCT 28.9* 34.0* 32.4*   PLT 312 297 306     Recent Labs     06/29/24  0359 06/30/24  1202 07/01/24  0529   NA 132* 135* 138   K 4.8 4.7 5.2*   CL 98 99 102   CO2 23 25 24    BUN 35* 24* 30*   MG 2.0  --   --    PHOS 5.3*  --   --    ALT 110* 102* 88*     No components found for: [facility]

## 2024-07-01 NOTE — Progress Notes (Signed)
 Pt continues to be non compliant with diet orders and fluid restrictions. Refusing overnight tube feeding. Eating hot cheetos and outside food including fast food at least daily. Complaints of pain and nausea continue. Will continue to educate on importance of diet r/t gastroparesis and how following MD orders regarding diet changes may help her symptoms significantly.

## 2024-07-02 LAB — CBC
Hematocrit: 31.7 % — ABNORMAL LOW (ref 35.0–47.0)
Hemoglobin: 9.7 g/dL — ABNORMAL LOW (ref 11.5–16.0)
MCH: 29.7 pg (ref 26.0–34.0)
MCHC: 30.6 g/dL (ref 30.0–36.5)
MCV: 96.9 FL (ref 80.0–99.0)
MPV: 9.6 FL (ref 8.9–12.9)
Nucleated RBCs: 0 /100{WBCs}
Platelets: 323 K/uL (ref 150–400)
RBC: 3.27 M/uL — ABNORMAL LOW (ref 3.80–5.20)
RDW: 15.6 % — ABNORMAL HIGH (ref 11.5–14.5)
WBC: 7.1 K/uL (ref 3.6–11.0)
nRBC: 0 K/uL (ref 0.00–0.01)

## 2024-07-02 LAB — POCT GLUCOSE
POC Glucose: 156 mg/dL — ABNORMAL HIGH (ref 65–117)
POC Glucose: 159 mg/dL — ABNORMAL HIGH (ref 65–117)
POC Glucose: 161 mg/dL — ABNORMAL HIGH (ref 65–117)

## 2024-07-02 LAB — COMPREHENSIVE METABOLIC PANEL
ALT: 68 U/L — ABNORMAL HIGH (ref 10–35)
AST: 31 U/L (ref 10–35)
Albumin/Globulin Ratio: 0.8 — ABNORMAL LOW (ref 1.1–2.2)
Albumin: 2.6 g/dL — ABNORMAL LOW (ref 3.5–5.2)
Alk Phosphatase: 497 U/L — ABNORMAL HIGH (ref 35–104)
Anion Gap: 11 mmol/L (ref 2–14)
BUN/Creatinine Ratio: 7 — ABNORMAL LOW (ref 12–20)
BUN: 38 mg/dL — ABNORMAL HIGH (ref 6–20)
CO2: 23 mmol/L (ref 20–29)
Calcium: 8.6 mg/dL (ref 8.6–10.0)
Chloride: 99 mmol/L (ref 98–107)
Creatinine: 5.16 mg/dL — ABNORMAL HIGH (ref 0.60–1.00)
Est, Glom Filt Rate: 10 ml/min/1.73m2 — ABNORMAL LOW (ref >59)
Globulin: 3.4 g/dL (ref 2.0–4.0)
Glucose: 179 mg/dL — ABNORMAL HIGH (ref 65–100)
Potassium: 5.6 mmol/L — ABNORMAL HIGH (ref 3.5–5.1)
Sodium: 133 mmol/L — ABNORMAL LOW (ref 136–145)
Total Bilirubin: 0.3 mg/dL (ref 0.0–1.2)
Total Protein: 6 g/dL — ABNORMAL LOW (ref 6.4–8.3)

## 2024-07-02 MED ORDER — INSULIN NPH (HUMAN) (ISOPHANE) 100 UNIT/ML SC SUSP
100 | Freq: Every morning | SUBCUTANEOUS | Status: DC
Start: 2024-07-02 — End: 2024-07-02
  Administered 2024-07-02: 14:00:00 7 [IU] via SUBCUTANEOUS

## 2024-07-02 MED ORDER — NIFEDIPINE ER OSMOTIC RELEASE 30 MG PO TB24
30 | Freq: Once | ORAL | Status: AC
Start: 2024-07-02 — End: 2024-07-02
  Administered 2024-07-02: 23:00:00 30 mg via ORAL

## 2024-07-02 MED ORDER — SEVELAMER CARBONATE 0.8 G PO PACK
0.8 | Freq: Three times a day (TID) | ORAL | 1 refills | 30.00000 days | Status: DC
Start: 2024-07-02 — End: 2024-08-28
  Filled 2024-07-03: qty 270, 90d supply, fill #0

## 2024-07-02 MED ORDER — PANTOPRAZOLE SODIUM 40 MG PO TBEC
40 | Freq: Two times a day (BID) | ORAL | Status: DC
Start: 2024-07-02 — End: 2024-07-02

## 2024-07-02 MED ORDER — LIDOCAINE 4 % EX PTCH
4 | MEDICATED_PATCH | Freq: Every day | CUTANEOUS | 0 refills | 30.00000 days | Status: DC
Start: 2024-07-02 — End: 2024-08-28
  Filled 2024-07-02: qty 15, 15d supply, fill #0

## 2024-07-02 MED FILL — HYDRALAZINE HCL 25 MG PO TABS: 25 mg | ORAL | Qty: 2 | Fill #0

## 2024-07-02 MED FILL — CULTURELLE IMMUNITY SUPPORT PO CAPS: ORAL | Qty: 1 | Fill #0

## 2024-07-02 MED FILL — NIFEDIPINE ER OSMOTIC RELEASE 30 MG PO TB24: 30 mg | ORAL | Qty: 1 | Fill #0

## 2024-07-02 MED FILL — OXYCODONE HCL 5 MG PO TABS: 5 mg | ORAL | Qty: 1 | Fill #0

## 2024-07-02 MED FILL — PANTOPRAZOLE SODIUM 40 MG IV SOLR: 40 mg | INTRAVENOUS | Qty: 40 | Fill #0

## 2024-07-02 MED FILL — HUMULIN N 100 UNIT/ML SC SUSP: 100 [IU]/mL | SUBCUTANEOUS | Qty: 7 | Fill #0

## 2024-07-02 MED FILL — PROCHLORPERAZINE EDISYLATE 10 MG/2ML IJ SOLN: 10 MG/2ML | INTRAMUSCULAR | Qty: 2 | Fill #0

## 2024-07-02 MED FILL — SEVELAMER CARBONATE 0.8 G PO PACK: 0.8 g | ORAL | Qty: 1 | Fill #0

## 2024-07-02 MED FILL — LIDOCAINE PAIN RELIEF 4 % EX PTCH: 4 % | CUTANEOUS | Qty: 1 | Fill #0

## 2024-07-02 NOTE — Progress Notes (Signed)
 Patient/caregivers speak Spanish as their preferred language for their healthcare communication.   To reach Language Services Navigator:    Melinda [name] [facility]  Senior Interpreter-Navigator [phone]            ___________________________  Melinda [name] are available for immediate Video remote Interpreter access or for Telephonic access call:  [facility]:  [phone]  [facility]: [phone]  [facility]:[phone]  [facility]:  [phone]  [facility]:  [phone]     For additional Language services resources  [email or web address]  [email or web address]  [phone])

## 2024-07-02 NOTE — Progress Notes (Signed)
 Pharmacy Dosing Services: 07/02/24    The pharmacist has determined that this patient meets P & T approved criteria for conversion from IV to oral therapy for the following medication:Protonix  IV      The pharmacist has written the following order for the patient: Protonix    The pharmacist will continue to monitor the patient's status and advise the physician if conversion back to IV therapy is recommended.    Signed [place]  [facility] Contact information: [phone]

## 2024-07-02 NOTE — Other (Signed)
 Hep B verification performed for (RN): Oncoming Rn  Hep B results, dates, and Primary Source: Negative/Susceptible  06/27/2024  06/28/2024  epic  Hepatitis B Surface Ag   Date/Time Value Ref Range Status   06/27/2024 01:36 PM NONREACTIVE NR   Final     Hep B S Ag Interp   Date/Time Value Ref Range Status   03/16/2023 08:19 AM Negative NEG   Final     Hep B S Ab   Date/Time Value Ref Range Status   06/28/2024 06:07 AM <3.50 mIU/mL Final     Comment:        <10.00 mIU/mL     Non-Immune  =>10.00 mIU/mL    Individual is considered to be immune to infection with HBV       Machine disinfect process:acid/heat

## 2024-07-02 NOTE — Consults (Signed)
 Session ID: [id number]  Session Duration: 5 minutes  Language: Spanish  Interpreter ID: #[id number]  Interpreter Name: Ismael

## 2024-07-02 NOTE — Progress Notes (Signed)
 NEPHROLOGY CONSULT NOTE     Patient: Melinda [name] MRN: [id number]  PCP: Marlin Slider, APRN - NP   DOB:     01-26-1981  Age:   43 y.o.  Sex:  female      Referring physician: Karenann Barter, MD  Reason for consultation:   Admission Date: 06/26/2024 11:56 AM  LOS: 5 days        ASSESSMENT and PLAN :   AKI on CKDV requiring HD-likely has ESRD now  Mild pulm edema  Mild hyperkalemia  AGMA secondary to CKD  SHPT  AOCD  IDDM with gastroparesis  Uninsured    Plan:  HD today; no renal objections to dc after HD  She does not have OP chair and will need to come to the ED for HD  Discussed on arrival with daughter better to come earlier in the day for HD  Continue phosp binder  Continue Home antihypertensives  Start EPO with HD  D/w HD RN         Subjective:   HPI    9/22: Chronic nausea. No new complaints.   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)     Gastroparesis     Gastroparesis     GERD (gastroesophageal reflux disease)     High cholesterol     Hypertension         Past Surgical Hx:     Past Surgical History:   Procedure Laterality Date    CAPSULE ENDOSCOPY N/A 01/20/2023    ESOPHAGEAL CAPSULE ENDOSCOPY remove at 1624PM performed by Madlyn Fendt, MD at [facility] ENDOSCOPY    CHOLECYSTECTOMY, LAPAROSCOPIC N/A 02/03/2023    ROBOTIC LAPAROSCOPIC CHOLECYSTECTOMY with Indocyanine green  performed by Chrystal Elijah BRAVO, MD at [facility] MAIN OR    COLONOSCOPY N/A 01/19/2023    COLONOSCOPY DIAGNOSTIC performed by Madlyn Fendt, MD at [facility] ENDOSCOPY    INVASIVE VASCULAR N/A 10/03/2023    Angiography visceral SMA performed by Millard Oneil LABOR, MD at [facility] CARDIAC CATH LAB    INVASIVE VASCULAR N/A 10/03/2023    Ultrasound guided vascular access performed by Millard Oneil LABOR, MD at [facility] CARDIAC CATH LAB    INVASIVE VASCULAR N/A 10/03/2023    Insert stent peripheral artery performed by Millard Oneil LABOR, MD at [facility]  CARDIAC CATH LAB    IR NONTUNNELED VASCULAR CATHETER > 5 YEARS  10/10/2023    IR NONTUNNELED VASCULAR CATHETER > 5 YEARS 10/10/2023 [facility] CARDIAC CATH/EP/IR LAB    IR NONTUNNELED VASCULAR CATHETER > 5 YEARS  10/10/2023    IR NONTUNNELED VASCULAR CATHETER > 5 YEARS 10/10/2023 [facility] CARDIAC CATH/EP/IR LAB    OTHER SURGICAL HISTORY Left     Rentia attachment    TUBAL LIGATION Bilateral     UPPER GASTROINTESTINAL ENDOSCOPY N/A 01/17/2023    ESOPHAGOGASTRODUODENOSCOPY performed by Madlyn Fendt, MD at [facility] ENDOSCOPY    UPPER GASTROINTESTINAL ENDOSCOPY N/A 01/17/2023    ESOPHAGOGASTRODUODENOSCOPY BIOPSY performed by Madlyn Fendt, MD at [facility] ENDOSCOPY    UPPER GASTROINTESTINAL ENDOSCOPY N/A 01/18/2023    ESOPHAGOGASTRODUODENOSCOPY performed by Madlyn Fendt, MD at [facility] ENDOSCOPY    UPPER GASTROINTESTINAL ENDOSCOPY N/A 05/13/2023    ESOPHAGOGASTRODUODENOSCOPY performed by Roseann Lonni BIRCH, MD at [facility] ENDOSCOPY    UPPER GASTROINTESTINAL ENDOSCOPY N/A 05/13/2023    ESOPHAGOGASTRODUODENOSCOPY BIOPSY performed  by Roseann Lonni BIRCH, MD at [facility] ENDOSCOPY    UPPER GASTROINTESTINAL ENDOSCOPY N/A 09/21/2023    ESOPHAGOGASTRODUODENOSCOPY performed by Madlyn Fendt, MD at [facility] ENDOSCOPY    UPPER GASTROINTESTINAL ENDOSCOPY N/A 09/21/2023    ESOPHAGOGASTRODUODENOSCOPY BIOPSY performed by Madlyn Fendt, MD at [facility] ENDOSCOPY    US  ABSCESS DRAINAGE PERITONEAL/RETROPERITONEAL [facility]  02/11/2023    US  ABSCESS DRAINAGE PERITONEAL 02/11/2023 [facility] RAD US        Medications:  Prior to Admission medications   Medication Sig Start Date End Date Taking? Authorizing Provider   lidocaine  4 % external patch Place 1 patch onto the skin daily 07/02/24  Yes Karenann Barter, MD   sevelamer  (RENVELA ) 0.8 g PACK packet Take 1 packet by mouth 3 times daily (with meals) 07/02/24  Yes Karenann Barter, MD   atorvastatin  (LIPITOR ) 80 MG tablet Take 1 tablet by mouth nightly   Yes [provider]   carvedilol  (COREG ) 12.5 MG tablet Take 1 tablet by mouth 2 times daily (with  meals)   Yes [provider]   famotidine  (PEPCID ) 20 MG tablet Take 1 tablet by mouth 2 times daily   Yes [provider]   hydrALAZINE  (APRESOLINE ) 50 MG tablet Take 1 tablet by mouth 3 times daily   Yes [provider]   DULoxetine  (CYMBALTA ) 60 MG extended release capsule Take 1 capsule by mouth daily 10/15/23  Yes Tefera, Mesfin A, MD   sodium bicarbonate  650 MG tablet Take 1 tablet by mouth 3 times daily 02/23/24   Sueellen Manuelita Bohr, MD   metoclopramide  (REGLAN ) 5 MG tablet Take 1 tablet by mouth 3 times daily (before meals) 02/23/24   Muncy, Manuelita Bohr, MD   pantoprazole  (PROTONIX ) 40 MG tablet Take 1 tablet by mouth 2 times daily (before meals) 02/23/24   Muncy, Manuelita Bohr, MD   sennosides-docusate sodium  (SENOKOT-S) 8.6-50 MG tablet Take 1 tablet by mouth in the morning and at bedtime 11/06/23   Do, Khoi B, MD   ondansetron  (ZOFRAN -ODT) 4 MG disintegrating tablet Take 1 tablet by mouth every 8 hours as needed for Nausea or Vomiting 11/06/23   Do, Rodrick NOVAK, MD   prochlorperazine  (COMPAZINE ) 10 MG tablet Take 1 tablet by mouth every 6 hours as needed (nausea or vomiting) 10/25/23   Orson Planas, MD   metFORMIN  (GLUCOPHAGE -XR) 500 MG extended release tablet Take 2 tablets by mouth daily (with breakfast)  Patient not taking: Reported on 06/26/2024 10/25/23   Aktig, Ziya, MD   busPIRone  (BUSPAR ) 15 MG tablet Take 15 mg by mouth 3 times daily  Patient taking differently: Take 5 mg by mouth in the morning and at bedtime 10/14/23   Tefera, Mesfin A, MD   clopidogrel  (PLAVIX ) 75 MG tablet Take 1 tablet by mouth daily 10/15/23   Tefera, Mesfin A, MD   aspirin  81 MG chewable tablet Take 1 tablet by mouth daily 10/15/23   Tefera, Mesfin A, MD   amLODIPine  (NORVASC ) 10 MG tablet Take 1 tablet by mouth daily 03/23/23   Do, Khoi B, MD       No Known Allergies    Social Hx:  reports that she has never smoked. She has never used smokeless tobacco. She reports that she does not drink alcohol and does  not use drugs.     No family history on file.    Review of Systems:  A twelve point review of system was performed today. Pertinent positives and negatives are mentioned in the HPI. The reminder of  the ROS is negative and noncontributory.     Objective:    Vitals:    Vitals:    07/01/24 2142 07/02/24 0522 07/02/24 0530 07/02/24 0735   BP:  (!) 131/59  128/62   Pulse:  69  70   Resp: 14  19 14    Temp:    98.1 F (36.7 C)   TempSrc:    Oral   SpO2:    94%   Height:         I&O's:  09/21 0701 - 09/22 0700  In: 700 [P.O.:700]  Out: -   BP 128/62   Pulse 70   Temp 98.1 F (36.7 C) (Oral)   Resp 14   Ht 1.499 m (4' 11)   SpO2 94%   BMI 25.65 kg/m     Physical Exam:    GENERAL : seated up in bed, NAD  HEENT: AT NC PEERLA   NECK: Supple no JVP  CVS: S1 S2 RRR  RS: CTABL, no rhonchi,or wheezing heard, not on 02  ABDOMEN: soft NT ND positive BS  EXTREMITY: No edema   NEUROLOGY: Moves all ext  HD access: LAVF    Laboratory Results:      Lab Results   Component Value Date/Time    NA 133 07/02/2024 09:50 AM    K 5.6 07/02/2024 09:50 AM    CL 99 07/02/2024 09:50 AM    CO2 23 07/02/2024 09:50 AM    BUN 38 07/02/2024 09:50 AM    CREATININE 5.16 07/02/2024 09:50 AM    GLUCOSE 179 07/02/2024 09:50 AM    CALCIUM  8.6 07/02/2024 09:50 AM          @LASTPROCAMB (POC81001;POC81003;poc81002;poc81000;POC81001F;POC81003E)@  No results found for: LABPROT, LABALBU     Lab Results   Component Value Date    WBC 7.1 07/02/2024    HGB 9.7 (L) 07/02/2024    HCT 31.7 (L) 07/02/2024    MCV 96.9 07/02/2024    PLT 323 07/02/2024    LYMPHOPCT 12.1 06/28/2024    RBC 3.27 (L) 07/02/2024    MCH 29.7 07/02/2024    MCHC 30.6 07/02/2024    RDW 15.6 (H) 07/02/2024                   Damien FORBES Leos, PA-C  07/02/2024    Thank you for consulting [facility] in the care of your patient.    [facility]:  [email or web address]  [email or web address]

## 2024-07-02 NOTE — Discharge Summary (Signed)
 are mis-transcribed.     Hospitalist Discharge Summary     Patient ID:  Melinda [name]  [id number]  43 y.o.  Jun 12, 1981    Admit date: 06/26/2024    Discharge date and time: 07/02/2024    Admission Diagnoses: ESRD (end stage renal disease) on dialysis (HCC) [N18.6, Z99.2]    Discharge Diagnoses:    Principal Problem:    ESRD (end stage renal disease) on dialysis [facility])  Active Problems:    Acute hyperkalemia    ESRD (end stage renal disease) (HCC)    Type 2 diabetes mellitus with chronic kidney disease on chronic dialysis, with long-term current use of insulin  (HCC)  Resolved Problems:    Elevated LFTs         Hospital Course:      43 y.o. female with PMHx ESRD, T2DM with gastroparesis requiring JG tube who is admitted for dialysis.         #Abdominal pain  Diffuse abdominal pain patient reporting today  Normal bowel movements with last bowel movement yesterday  LFTs trending down  Lactic 0.7  Lipase 11  Repeat CT abdomen pelvis neg   however MRCP was done on the 19th that was negative  Plan  Continue protonix  OP         #Acute LFT elevation. POA, improved  LFTs trending down normal T. bili  -Vitamin D 13.2  -Hepatitis panel neg  -ultrasound shows liver is grossly normal  - MRCP: The heart is enlarged. There is a moderate right pleural effusion, trace left  pleural effusion, and a small pericardial effusion, and a small volume of  ascites. A percutaneous gastrostomy tube is in appropriate position. No  pancreatic or biliary ductal dilation.     The unenhanced distal esophagus, stomach, duodenum, liver, pancreas, spleen,  adrenals, and visualized portions of the bowel, are normal. Post  cholecystectomy. Mild renal atrophy.  -GI eval the patient      #ESRD  On HD   Plan for HD today prior to discharge     #Acute hyperkalemia. POA.  -In setting of ESRD with need for dialysis.  -Received Lokelma , D10, insulin  in ED.  -Monitor electrolytes closely and treat as needed   Nephrology on board     Acute on Chronic volume  overload 2/2 noncompliance with dialysis  -consulted nephrology to resume dialysis              -resume phosp binder (Phosp 5.2),   -Admitted for HD. She will need to come to the ED for HD  -started on HD 04/2024, had been getting it at [facility] while they are on vacation   -Case management consulted to help establish with outpatient center   -BNP 47,007 => 36,662  -monitor labs  -replete electrolytes as needed  -continue sodium bicarb        # T2DM with gastroparesis. POA, chronic.  # S/p GJ tube- Required GJ tube placement for adequate nutrition.  -A1C 6.1  -SSI  -Nutrition consulted  -Pt refused tube feeding overnight and is eating cheetos for breakfast.  -Diabetic management consulted given continued increased glucose despite controlled A1C.      # Elevated troponin. POA, acute on chronic. Improving  # Elevated proBNP. POA, acute on chronic  -likely falsely evaluated in the setting of ESRD.  The patient does not have symptoms consistent with ACS or heart failure at this time.  No need to trend further.     # back pain. POA.  -Pt  complaining of mid to upper back pain that began 4 days ago. Pt reports pain is worse with movement and coughing. Likely musculoskeletal   PE: paraspinal muscle tenderness   -dc dilaudid   -lidocaine  patches, voltaren  gel and roxi PRN       Imaging  CT ABDOMEN PELVIS WO CONTRAST Additional Contrast? None  Result Date: 07/01/2024  1. No nephrolithiasis or hydronephrosis. 2. Moderate right and small left pleural effusions. Cardiomegaly and small pericardial effusion. Pulmonary edema. 3. Gastrojejunostomy tube with tip in the mid jejunum. No bowel obstruction, free air, or free fluid. 4. 3.4 cm right ovarian cyst. Electronically signed by Shlomo Kerns    MRI ABDOMEN WO CONTRAST MRCP  Result Date: 06/30/2024  Cardiomegaly and third spacing. Electronically signed by Medford Manners    US  ABDOMEN LIMITED Specify organ? LIVER, GALLBLADDER  Result Date: 06/26/2024  Right pleural effusion.  Electronically signed by Medford Manners    XR CHEST (2 VW)  Result Date: 06/26/2024  Cardiomegaly, small right pleural effusion, and interstitial pulmonary edema. Electronically signed by Oneil Fireman, MD       PCP: Marlin Slider, APRN - NP     Consults: nephrology    Condition of patient at discharge: Stable    Discharge Exam:    Physical Exam:    Gen:  Well-developed, well-nourished, in no acute distress  HEENT:  Pink conjunctivae, PERRL, hearing intact to voice, moist mucous membranes  Neck:  Supple, without masses, thyroid non-tender  Resp:  No accessory muscle use, clear breath sounds without wheezes rales or rhonchi  Card:  No murmurs, normal S1, S2 without thrills, bruits or peripheral edema  Abd:  Soft, non-tender, non-distended, normoactive bowel sounds are present  Musc:  No cyanosis or clubbing, mid and upper back tender to palpation.   Skin:  No rashes or ulcers, skin turgor is good  Neuro:  Cranial nerves 3-12 are grossly intact, grip strength is 5/5 bilaterally and dorsi / plantarflexion is 5/5 bilaterally, follows commands appropriately  Psych:  Good insight, oriented to person, place and time, alert         Disposition: home    Patient Instructions:   Current Discharge Medication List        START taking these medications    Details   lidocaine  4 % external patch Place 1 patch onto the skin daily  Qty: 15 patch, Refills: 0      sevelamer  (RENVELA ) 0.8 g PACK packet Take 1 packet by mouth 3 times daily (with meals)  Qty: 270 each, Refills: 1           CONTINUE these medications which have NOT CHANGED    Details   atorvastatin  (LIPITOR ) 80 MG tablet Take 1 tablet by mouth nightly      carvedilol  (COREG ) 12.5 MG tablet Take 1 tablet by mouth 2 times daily (with meals)      famotidine  (PEPCID ) 20 MG tablet Take 1 tablet by mouth 2 times daily      hydrALAZINE  (APRESOLINE ) 50 MG tablet Take 1 tablet by mouth 3 times daily      DULoxetine  (CYMBALTA ) 60 MG extended release capsule Take 1 capsule by mouth  daily  Qty: 30 capsule, Refills: 3      sodium bicarbonate  650 MG tablet Take 1 tablet by mouth 3 times daily  Qty: 90 tablet, Refills: 0      metoclopramide  (REGLAN ) 5 MG tablet Take 1 tablet by mouth 3 times daily (before meals)  Qty: 120 tablet,  Refills: 3      pantoprazole  (PROTONIX ) 40 MG tablet Take 1 tablet by mouth 2 times daily (before meals)  Qty: 30 tablet, Refills: 3      sennosides-docusate sodium  (SENOKOT-S) 8.6-50 MG tablet Take 1 tablet by mouth in the morning and at bedtime  Qty: 60 tablet, Refills: 1      ondansetron  (ZOFRAN -ODT) 4 MG disintegrating tablet Take 1 tablet by mouth every 8 hours as needed for Nausea or Vomiting  Qty: 30 tablet, Refills: 0      prochlorperazine  (COMPAZINE ) 10 MG tablet Take 1 tablet by mouth every 6 hours as needed (nausea or vomiting)  Qty: 120 tablet, Refills: 3      metFORMIN  (GLUCOPHAGE -XR) 500 MG extended release tablet Take 2 tablets by mouth daily (with breakfast)  Qty: 90 tablet, Refills: 1      busPIRone  (BUSPAR ) 15 MG tablet Take 15 mg by mouth 3 times daily  Qty: 30 tablet, Refills: 0      clopidogrel  (PLAVIX ) 75 MG tablet Take 1 tablet by mouth daily  Qty: 30 tablet, Refills: 3      aspirin  81 MG chewable tablet Take 1 tablet by mouth daily  Qty: 30 tablet, Refills: 3      amLODIPine  (NORVASC ) 10 MG tablet Take 1 tablet by mouth daily  Qty: 30 tablet, Refills: 2           STOP taking these medications       linaclotide  (LINZESS ) 290 MCG CAPS capsule Comments:   Reason for Stopping:         dicyclomine  (BENTYL ) 20 MG tablet Comments:   Reason for Stopping:         metoprolol  succinate (TOPROL  XL) 50 MG extended release tablet Comments:   Reason for Stopping:             Activity: activity as tolerated  Diet: renal diet  Wound Care: none needed    Follow-up with Idowu, Funmilola, APRN - NP in 1 week.  Follow-up tests/labs HD     Approximate time spent in patient care on day of discharge: 45 min     Signed:  Prentice Arnold, MD  07/02/2024  10:37 AM    Portions  of this note were completed with Dragon, the computer voice recognition software.  Quite often unanticipated grammatical, syntax, homophones, and other interpretive errors are inadvertently transcribed by the computer software.  Please disregard these errors.  Efforts were made to edit the dictations but occasionally words are mis-transcribed.

## 2024-07-02 NOTE — Other (Signed)
 Primary RN SBAR: Richerd, RN  Incapacitated Nurse edu. provided: yes  Patient Education provided: Missed HD/ dietary  Preferred Education method and Primary language:Spanish translate  Dialysis consent: chronic consent applied  Hospital General Consent Verified: yes  Hospital associated wait time; reason: none  Hepatitis B Surface Ag   Date/Time Value Ref Range Status   06/27/2024 01:36 PM NONREACTIVE NR   Final     Hep B S Ag Interp   Date/Time Value Ref Range Status   03/16/2023 08:19 AM Negative NEG   Final     Hep B S Ab   Date/Time Value Ref Range Status   06/28/2024 06:07 AM <3.50 mIU/mL Final     Comment:        <10.00 mIU/mL     Non-Immune  =>10.00 mIU/mL    Individual is considered to be immune to infection with HBV       Hep B results, dates, and Primary Source: Negative 06/27/2024 Susc. 06/28/2024 Hospital Epic  Hep B dual verification performed by (RN): Dinep, Merilee, RN  Machine disinfect process:Standard Acid Heat         07/02/24 1325   Treatment   Treatment Goal 3.5H/2.5L   Observations & Evaluations   Level of Consciousness 0   Oriented X x4   Heart Rhythm Regular   Respiratory Quality/Effort Unlabored   O2 Device None (Room air)   Skin Color   (appropriate for ethnicity)   Skin Condition/Temp Dry;Warm   Appetite Fair   Abdomen Inspection Soft   Last BM (including prior to admit) 06/29/24   Edema None   Vital Signs   BP (!) 150/80   Temp 97.3 F (36.3 C)   Pulse 71   Respirations 18   SpO2 94 %   Pain Assessment   Pain Assessment 0-10   Pain Level 10   Patient's Stated Pain Goal 0 - No pain   Pain Location Abdomen;Back   Pain Descriptors Aching   Technical Checks   Dialysis Machine No. [id number]   RO Machine Number [id number]   Dialyzer Lot No. [id number]   Tubing Lot Number [id number]   All Connections Secure Yes   NS Bag Yes   Saline Line Double Clamped Yes   Dialyzer F-180   Prime Volume (mL) 250 mL   ICEBOAT I;C;E;B;O;A;T   RO Machine Log Sheet Completed Yes   Machine Alarm Self Test  Completed;Passed   Child psychotherapist Function;pH Reading   Sport and exercise psychologist Conductivity 13.8   Manual Ph 7.4   Bleach Test (Neg) Yes   Bath Temperature 98.6 F (37 C)   Dialysis Bath   K+ (Potassium) 2   Ca+ (Calcium ) 2.5   Na+ (Sodium) 40   HCO3 (Bicarb) 138   Bicarbonate Concentrate Lot No. [id number]   Acid Concentrate Lot No. [id number]   Handoff   Communication Given   (Pre HD)   Handoff Given To Andrea, RN   Handoff Received From [place], [place]   Handoff Communication Telephone       Each access site disinfected for 60 seconds per site with alcohol swabs per P&P. Cannulated with 15G needles x2 and secured with paper tape.   +aspiration/+flushed.     Patient's consent, code status, medications checked.                      Safety checks complete, time out performed.   assessed, +bruit/thrill, no redness, warmth or  drainage noted. Skin prepped per procedure using chloraprep scrub x 30 sec, followed by 30 sec dry time each site. Cannulated using 15g needles each site and secured with tape. +flash/aspiration/flush. HD initiated. Access visible, lines secure.      07/02/24 1330   Hemodialysis Fistula/Graft Arteriovenous fistula Left Arm   No placement date or time found.   Present on Admission/Arrival: Yes  Access Type: Arteriovenous fistula  Orientation: Left  Access Location: Arm   Site Assessment Clean, dry & intact   Thrill Present   Bruit Present   Status Accessed   Venous Needle Size 15 G   Arterial Needle Size 15 G   Accessed ByBETHA Hollering, RN   Access Attempts  1   Access Interventions Needles taped to patient;Chlorhexidine      07/02/24 1333   During Hemodialysis Assessment   BP (!) 151/81   Pulse 71   Blood Flow Rate (ml/min) 350 ml/min   Arterial Pressure (mmHg) -160 mmHg   Venous Pressure (mmHg) 140   TMP 30   DFR 500   Comments   (HD initiated)   Access Visible Yes   Ultrafiltration Rate (ml/hr) 860 ml/hr   Ultrafiltration Removed (ml) 0 ml         07/02/24 1700   During Hemodialysis Assessment   BP (!) 165/90   Pulse 79   Blood Flow Rate (ml/min) 400 ml/min   Arterial Pressure (mmHg) -180 mmHg   Venous Pressure (mmHg) 170   TMP 40   DFR 600   Comments   (HD competed)   Access Visible Yes   Ultrafiltration Rate (ml/hr) 860 ml/hr   Ultrafiltration Removed (ml) 3000 ml     POST HD       07/02/24 1724   Observations & Evaluations   Level of Consciousness 0   Oriented X x4   Heart Rhythm Regular   Respiratory Quality/Effort Unlabored   O2 Device None (Room air)   Skin Color Pale   Skin Condition/Temp Dry;Warm   Appetite Fair   Last BM (including prior to admit) 06/29/24   Edema None   Vital Signs   BP (!) 183/93   Temp 97.3 F (36.3 C)   Pulse 80   Respirations 18   SpO2 97 %   Pain Assessment   Pain Assessment 0-10   Pain Level 0   Hemodialysis Fistula/Graft Arteriovenous fistula Left Arm   No placement date or time found.   Present on Admission/Arrival: Yes  Access Type: Arteriovenous fistula  Orientation: Left  Access Location: Arm   Site Assessment Clean, dry & intact   Thrill Present   Bruit Present   Status Deaccessed   Post-Hemodialysis Assessment   Post-Treatment Procedures Blood returned;Access bleeding time < 10 minutes   Actuary;Exterior Engineer, agricultural Volume (ml) 250 ml   Blood Volume Processed (Liters) 79 L   Dialyzer Clearance Lightly streaked   Duration of Treatment (minutes) 210 minutes   Hemodialysis Intake (ml) 500 ml   Hemodialysis Output (ml) 3000 ml   NET Removed (ml) 2500   Tolerated Treatment Good   Patient Response to Treatment tolerated well   Patient Disposition Return to room   Handoff   Communication Given   (Post HD)   Handoff Given To [place], RN   Handoff Received From Olivia Lopez de Gutierrez, RN   Handoff Communication Telephone     HD treatment complete. All possible blood returned to the patient. De-cannulated and pressure held until  hemostasis was achieved. No bleeding  noted.  Dressing applied. +bruit/thrill.   Patient tolerated treatment without incident, UF goal achieved. At end of treatment patient is awake and alert,Comfort and care rendered, needs attended, questions answered.  Call bell within reach, bed in lowest position.  Report given to primary RN

## 2024-07-02 NOTE — Care Coordination-Inpatient (Addendum)
 2:38 PM  Carefund request for two medications   idocaine 4 % external patch Place 1 patch onto the skin daily  Qty: 15 patch, Refills: 0       sevelamer  (RENVELA ) 0.8 g PACK packet Take 1 packet by mouth 3 times daily (with meals)  Qty: 270 each, Refills: 1   Cost is $797.96 - [facility]  Medications need to be ordered and will be in tomorrow - attending notified. Daisy aware that she will have to pick up medications tomorrow.   Spoke with pt's daughter Jonette. She stated that they will work together as a family to pay for the medications after discharge. Pt's husband will be here between 5:30-6:00 to pick up patient - [place] RN notified. No other issues or concerns at this time. Patient is ready for discharge from CM standpoint.   Almarie Ferrari, MSW

## 2024-07-02 NOTE — Progress Notes (Signed)
 Nurse handed patient a copy of discharge instructions, which have been read and explained to patient. New medications read and explained Daughter in the room and helped translate. Patient verbalized understanding. Patient aware that prescriptions have been electronically sent to pharmacy. Opportunity for questions and clarification offered. Pt began removing her own IV, I finished removing her IV access with no complications. BP 180/69 MD made aware, Pt was adamant on leaving against my advice. She did agree to take Nifedipine  before she left. All other VS stable.

## 2024-07-02 NOTE — Progress Notes (Signed)
 Lauraine Chalk, PA-C                       [phone] office             Monday-Friday 8:00 am-4:30 pm  I am not permitted to use perfect serve use above for contact, thanks.        Gastroenterology Progress Note    July 02, 2024  Admit Date: 06/26/2024         Narrative Assessment and Plan   43 year old Spanish-speaking female well-known to the GI service with a prior history of intractable nausea and vomiting found to have gastroparesis, inability to reliably obtain prokinetic and other GI related medications in the outpatient setting and ultimately seen at [facility] with placement of a PEG/GI to address enteral access and GI distress symptoms. The patient undergoes ESRD on hemodialysis Monday Wednesday Friday at [facility]. Unable to continue with outpatient hemodialysis at [facility] and now presents to the ED. She is found on routine labs to have rising alkaline phosphatase currently 761 from previous smoldering levels in the 200 range, low-level transaminase elevations, normal T. bili. Right upper quadrant ultrasound status post cholecystectomy, CBD 5 mm. Gallbladder explant pathology from April 2024 cholecystectomy revealed calculous cholecystitis.     9/22: The patient still complains of generalized abdominal pain and some nausea.,  However not worsened.  Overall noted to have improving LFTs.  However noted elevated alk phos. MRCP 9/20 revealed post cholecystectomy, no pancreatic or biliary duct dilation.  CT abdomen and pelvis 9/21 revealed small pleural effusion, cardiomegaly, GJ tube in place.    LFTs today: Alk phos 497, ALT 68, AST 31, total bili 0.3, GGT 634.  Alk phos isoenzymes completed pending.  Nausea and abdominal discomfort likely due to gastroparesis.    Impression:  Elevated LFTs, predominantly alk phos  History of paresis-JG tube    Plan:  Would recommend outpatient follow-up due to elevated  alk phos and LFTs.  Recommend obtaining serology workup while inpatient however if discharge prior to can order while outpatient.  Will have my office call to set up outpatient assessment for elevated LFts.   Continue PPI therapy outpatient    Lauraine Chalk, PA-C    Subjective:   Chief Complaint: Elevated LFTs    HPI: Today, the patient is resting in bed.  She does note some abdominal pain with nausea; however not worsened during admission.  However repeated CT of abdomen and pelvis was negative and MRCP negative on 9/19.  GJ tube in place.    ROS:  The previous review of systems on initial consultation / H&P is noted and reviewed.  Specific changes noted above in HPI.    Current Medications:     Current Facility-Administered Medications   Medication Dose Route Frequency    insulin  NPH (HumuLIN  N;NovoLIN  N) injection vial 7 Units  7 Units SubCUTAneous QAM    pantoprazole  (PROTONIX ) tablet 40 mg  40 mg Oral BID    DULoxetine  (CYMBALTA ) extended release capsule 60 mg  60 mg Oral Daily    prochlorperazine  (COMPAZINE ) injection 10 mg  10 mg IntraVENous Q6H PRN    Or    prochlorperazine  (COMPAZINE ) tablet 10 mg  10 mg Oral Q6H PRN    epoetin  alfa-epbx (RETACRIT ) 6,000 Units combo injection  6,000 Units SubCUTAneous Once per day on Monday Wednesday Friday    oxyCODONE  (ROXICODONE ) immediate release tablet 5 mg  5 mg Oral Q4H PRN  lidocaine  4 % external patch 1 patch  1 patch TransDERmal Daily    diclofenac  sodium (VOLTAREN ) 1 % gel 2 g  2 g Topical BID    albumin  human 25% IV solution 25 g  25 g IntraVENous PRN    lactobacillus (CULTURELLE) capsule 1 capsule  1 capsule Oral Daily with breakfast    sevelamer  (RENVELA ) packet 0.8 g  0.8 g Oral TID WC    hydrALAZINE  (APRESOLINE ) tablet 50 mg  50 mg Oral 3 times per day    sodium chloride  flush 0.9 % injection 5-40 mL  5-40 mL IntraVENous 2 times per day    sodium chloride  flush 0.9 % injection 5-40 mL  5-40 mL IntraVENous PRN    0.9 % sodium chloride  infusion   IntraVENous  PRN    ondansetron  (ZOFRAN -ODT) disintegrating tablet 4 mg  4 mg Oral Q8H PRN    Or    ondansetron  (ZOFRAN ) injection 4 mg  4 mg IntraVENous Q6H PRN    polyethylene glycol (GLYCOLAX ) packet 17 g  17 g Oral Daily PRN    aspirin  chewable tablet 81 mg  81 mg Oral Daily    amLODIPine  (NORVASC ) tablet 10 mg  10 mg Oral Daily    metoclopramide  (REGLAN ) tablet 5 mg  5 mg Oral TID AC    ondansetron  (ZOFRAN -ODT) disintegrating tablet 4 mg  4 mg Oral Q8H PRN    sennosides-docusate sodium  (SENOKOT-S) 8.6-50 MG tablet 1 tablet  1 tablet Oral BID    clopidogrel  (PLAVIX ) tablet 75 mg  75 mg Oral Daily    busPIRone  (BUSPAR ) tablet 5 mg  5 mg Oral BID    carvedilol  (COREG ) tablet 12.5 mg  12.5 mg Oral BID WC    heparin  (porcine) injection 5,000 Units  5,000 Units SubCUTAneous 3 times per day    glucose chewable tablet 16 g  4 tablet Oral PRN    dextrose  bolus 10% 125 mL  125 mL IntraVENous PRN    Or    dextrose  bolus 10% 250 mL  250 mL IntraVENous PRN    glucagon  injection 1 mg  1 mg SubCUTAneous PRN    dextrose  10 % infusion   IntraVENous Continuous PRN    insulin  lispro (HUMALOG ,ADMELOG ) injection vial 0-8 Units  0-8 Units SubCUTAneous 4x Daily AC & HS    clonazePAM  (KLONOPIN ) tablet 0.5 mg  0.5 mg Oral QHS PRN       Objective:     VITALS:   Last 24hrs VS reviewed since prior progress note. Most recent are:  Vitals:    07/02/24 1615   BP: (!) 151/82   Pulse: 80   Resp:    Temp:    SpO2:      Temp (24hrs), Avg:97.9 F (36.6 C), Min:97.3 F (36.3 C), Max:98.2 F (36.8 C)      Intake/Output Summary (Last 24 hours) at 07/02/2024 1629  Last data filed at 07/02/2024 0915  Gross per 24 hour   Intake 175 ml   Output --   Net 175 ml       General: alert, well appearing, and no distress  Head: Normocephalic, without obvious abnormality, atraumatic  Eyes: anicteric sclerae and conjuntiva clear  Lungs: normal respiratory effort  Abd: not distended, soft, minimal generalized tenderness to palpation  Ext: no cyanosis and no edema  Skin:  normal skin color, no rashes, and texture normal  Neuro:  Alert and oriented x 4  Psych: not anxious, cooperative, appropriate affect  Lab Data Reviewed:   Recent Labs     06/30/24  1202 07/01/24  0529 07/02/24  0950   WBC 7.2 7.3 7.1   HGB 10.4* 10.1* 9.7*   HCT 34.0* 32.4* 31.7*   PLT 297 306 323     Recent Labs     06/30/24  1202 07/01/24  0529 07/02/24  0950   NA 135* 138 133*   K 4.7 5.2* 5.6*   CL 99 102 99   CO2 25 24 23    BUN 24* 30* 38*   ALT 102* 88* 68*     No results found for: GLUCPOC  No results for input(s): PH, PCO2, PO2, HCO3, FIO2 in the last 72 hours.  No results for input(s): INR in the last 72 hours.        Assessment:   (See above)  Principal Problem:    ESRD (end stage renal disease) on dialysis [facility])  Active Problems:    Acute hyperkalemia    ESRD (end stage renal disease) (HCC)    Type 2 diabetes mellitus with chronic kidney disease on chronic dialysis, with long-term current use of insulin  (HCC)  Resolved Problems:    Elevated LFTs      Plan:   (See above)      Signed By: Lauraine Chalk, PA-C     07/02/2024  4:29 PM

## 2024-07-03 LAB — FRUCTOSAMINE: Fructosamine: 284 umol/L (ref 0–285)

## 2024-07-05 ENCOUNTER — Emergency Department: Admit: 2024-07-05 | Primary: Adult Health

## 2024-07-05 ENCOUNTER — Observation Stay
Admission: EM | Admit: 2024-07-05 | Discharge: 2024-07-05 | Disposition: A | Discharge status: Home / Self Care | Attending: Internal Medicine | Admitting: Internal Medicine

## 2024-07-05 DIAGNOSIS — E875 Hyperkalemia: Principal | ICD-10-CM

## 2024-07-05 DIAGNOSIS — I120 Hypertensive chronic kidney disease with stage 5 chronic kidney disease or end stage renal disease: Secondary | ICD-10-CM

## 2024-07-05 LAB — CBC WITH AUTO DIFFERENTIAL
Basophils %: 0.8 % (ref 0.0–1.0)
Basophils Absolute: 0.08 K/UL (ref 0.00–0.10)
Eosinophils %: 11.7 % — ABNORMAL HIGH (ref 0.0–7.0)
Eosinophils Absolute: 1.16 K/UL — ABNORMAL HIGH (ref 0.00–0.40)
Hematocrit: 38 % (ref 35.0–47.0)
Hemoglobin: 12.2 g/dL (ref 11.5–16.0)
Immature Granulocytes %: 0.2 % (ref 0.0–0.5)
Immature Granulocytes Absolute: 0.02 K/UL (ref 0.00–0.04)
Lymphocytes %: 14.5 % (ref 12.0–49.0)
Lymphocytes Absolute: 1.44 K/UL (ref 0.80–3.50)
MCH: 30.8 pg (ref 26.0–34.0)
MCHC: 32.1 g/dL (ref 30.0–36.5)
MCV: 96 FL (ref 80.0–99.0)
MPV: 9.8 FL (ref 8.9–12.9)
Monocytes %: 7.2 % (ref 5.0–13.0)
Monocytes Absolute: 0.71 K/UL (ref 0.00–1.00)
Neutrophils %: 65.6 % (ref 32.0–75.0)
Neutrophils Absolute: 6.49 K/UL (ref 1.80–8.00)
Nucleated RBCs: 0 /100{WBCs}
Platelets: 447 K/uL — ABNORMAL HIGH (ref 150–400)
RBC: 3.96 M/uL (ref 3.80–5.20)
RDW: 15.6 % — ABNORMAL HIGH (ref 11.5–14.5)
WBC: 9.9 K/uL (ref 3.6–11.0)
nRBC: 0 K/uL (ref 0.00–0.01)

## 2024-07-05 LAB — COMPREHENSIVE METABOLIC PANEL
ALT: 68 U/L — ABNORMAL HIGH (ref 10–35)
AST: 54 U/L — ABNORMAL HIGH (ref 10–35)
Albumin/Globulin Ratio: 0.7 — ABNORMAL LOW (ref 1.1–2.2)
Albumin: 3.1 g/dL — ABNORMAL LOW (ref 3.5–5.2)
Alk Phosphatase: 592 U/L — ABNORMAL HIGH (ref 35–104)
Anion Gap: 15 mmol/L — ABNORMAL HIGH (ref 2–14)
BUN/Creatinine Ratio: 9 — ABNORMAL LOW (ref 12–20)
BUN: 43 mg/dL — ABNORMAL HIGH (ref 6–20)
CO2: 22 mmol/L (ref 20–29)
Calcium: 9.1 mg/dL (ref 8.6–10.0)
Chloride: 98 mmol/L (ref 98–107)
Creatinine: 4.68 mg/dL — ABNORMAL HIGH (ref 0.60–1.00)
Est, Glom Filt Rate: 11 ml/min/1.73m2 — ABNORMAL LOW (ref >59)
Globulin: 4.3 g/dL — ABNORMAL HIGH (ref 2.0–4.0)
Glucose: 237 mg/dL — ABNORMAL HIGH (ref 65–100)
Potassium: 6.5 mmol/L — ABNORMAL HIGH (ref 3.5–5.1)
Sodium: 135 mmol/L — ABNORMAL LOW (ref 136–145)
Total Bilirubin: 0.4 mg/dL (ref 0.0–1.2)
Total Protein: 7.4 g/dL (ref 6.4–8.3)

## 2024-07-05 LAB — COVID-19 & INFLUENZA COMBO
Rapid Influenza A By PCR: NOT DETECTED
Rapid Influenza B By PCR: NOT DETECTED
SARS-CoV-2, PCR: NOT DETECTED

## 2024-07-05 LAB — POTASSIUM: Potassium: 6.5 mmol/L — ABNORMAL HIGH (ref 3.5–5.1)

## 2024-07-05 LAB — POCT GLUCOSE: POC Glucose: 235 mg/dL — ABNORMAL HIGH (ref 65–117)

## 2024-07-05 LAB — LIPASE: Lipase: 40 U/L (ref 13–60)

## 2024-07-05 MED ORDER — INSULIN GLARGINE 100 UNIT/ML SC SOLN
100 | Freq: Every evening | SUBCUTANEOUS | Status: DC
Start: 2024-07-05 — End: 2024-07-05

## 2024-07-05 MED ORDER — ONDANSETRON 4 MG PO TBDP
4 | Freq: Three times a day (TID) | ORAL | Status: DC | PRN
Start: 2024-07-05 — End: 2024-07-05

## 2024-07-05 MED ORDER — DEXTROSE 10 % IV BOLUS
INTRAVENOUS | Status: DC | PRN
Start: 2024-07-05 — End: 2024-07-05

## 2024-07-05 MED ORDER — ACETAMINOPHEN 500 MG PO TABS
500 | ORAL | Status: DC
Start: 2024-07-05 — End: 2024-07-05

## 2024-07-05 MED ORDER — SODIUM ZIRCONIUM CYCLOSILICATE 10 G PO PACK
10 | Freq: Once | ORAL | Status: DC
Start: 2024-07-05 — End: 2024-07-05

## 2024-07-05 MED ORDER — CARVEDILOL 12.5 MG PO TABS
12.5 | Freq: Two times a day (BID) | ORAL | Status: DC
Start: 2024-07-05 — End: 2024-07-05

## 2024-07-05 MED ORDER — POLYETHYLENE GLYCOL 3350 17 G PO PACK
17 | Freq: Every day | ORAL | Status: DC | PRN
Start: 2024-07-05 — End: 2024-07-05

## 2024-07-05 MED ORDER — HYDRALAZINE HCL 25 MG PO TABS
25 | Freq: Three times a day (TID) | ORAL | Status: DC
Start: 2024-07-05 — End: 2024-07-05

## 2024-07-05 MED ORDER — FAMOTIDINE 20 MG PO TABS
20 | ORAL | Status: DC
Start: 2024-07-05 — End: 2024-07-05

## 2024-07-05 MED ORDER — PANTOPRAZOLE SODIUM 40 MG PO TBEC
40 | Freq: Two times a day (BID) | ORAL | Status: DC
Start: 2024-07-05 — End: 2024-07-05

## 2024-07-05 MED ORDER — NORMAL SALINE FLUSH 0.9 % IV SOLN
0.9 | Freq: Two times a day (BID) | INTRAVENOUS | Status: DC
Start: 2024-07-05 — End: 2024-07-05

## 2024-07-05 MED ORDER — AMLODIPINE BESYLATE 5 MG PO TABS
5 | Freq: Every day | ORAL | Status: DC
Start: 2024-07-05 — End: 2024-07-05

## 2024-07-05 MED ORDER — GLUCAGON (RDNA) 1 MG IJ KIT
1 | INTRAMUSCULAR | Status: DC | PRN
Start: 2024-07-05 — End: 2024-07-05

## 2024-07-05 MED ORDER — BUSPIRONE HCL 10 MG PO TABS
10 | Freq: Two times a day (BID) | ORAL | Status: DC
Start: 2024-07-05 — End: 2024-07-05

## 2024-07-05 MED ORDER — LIDOCAINE 4 % EX PTCH
4 | CUTANEOUS | Status: DC
Start: 2024-07-05 — End: 2024-07-05
  Administered 2024-07-05: 16:00:00 1 via TRANSDERMAL

## 2024-07-05 MED ORDER — ONDANSETRON HCL 4 MG/2ML IJ SOLN
4 | INTRAMUSCULAR | Status: AC
Start: 2024-07-05 — End: 2024-07-05
  Administered 2024-07-05: 15:00:00 4 via INTRAVENOUS

## 2024-07-05 MED ORDER — SODIUM CHLORIDE 0.9 % IV SOLN
0.9 | INTRAVENOUS | Status: DC | PRN
Start: 2024-07-05 — End: 2024-07-05

## 2024-07-05 MED ORDER — NORMAL SALINE FLUSH 0.9 % IV SOLN
0.9 | INTRAVENOUS | Status: DC | PRN
Start: 2024-07-05 — End: 2024-07-05

## 2024-07-05 MED ORDER — ACETAMINOPHEN 325 MG PO TABS
325 | Freq: Four times a day (QID) | ORAL | Status: DC | PRN
Start: 2024-07-05 — End: 2024-07-05

## 2024-07-05 MED ORDER — ONDANSETRON 4 MG PO TBDP
4 | Freq: Once | ORAL | Status: DC
Start: 2024-07-05 — End: 2024-07-05

## 2024-07-05 MED ORDER — INSULIN NPH ISOPHANE & REGULAR (70-30) 100 UNIT/ML SC SUPN
Freq: Two times a day (BID) | SUBCUTANEOUS | Status: DC
Start: 2024-07-05 — End: 2024-07-05

## 2024-07-05 MED ORDER — SODIUM BICARBONATE 650 MG PO TABS
650 | Freq: Three times a day (TID) | ORAL | Status: DC
Start: 2024-07-05 — End: 2024-07-05

## 2024-07-05 MED ORDER — CYCLOBENZAPRINE HCL 10 MG PO TABS
10 | ORAL | Status: AC
Start: 2024-07-05 — End: 2024-07-05
  Administered 2024-07-05: 16:00:00 10 mg via ORAL

## 2024-07-05 MED ORDER — SEVELAMER CARBONATE 0.8 G PO PACK
0.8 | Freq: Three times a day (TID) | ORAL | Status: DC
Start: 2024-07-05 — End: 2024-07-05

## 2024-07-05 MED ORDER — ACETAMINOPHEN 650 MG RE SUPP
650 | Freq: Four times a day (QID) | RECTAL | Status: DC | PRN
Start: 2024-07-05 — End: 2024-07-05

## 2024-07-05 MED ORDER — INSULIN LISPRO 100 UNIT/ML IJ SOLN
100 | Freq: Four times a day (QID) | INTRAMUSCULAR | Status: DC
Start: 2024-07-05 — End: 2024-07-05

## 2024-07-05 MED ORDER — HYDRALAZINE HCL 20 MG/ML IJ SOLN
20 | INTRAMUSCULAR | Status: DC
Start: 2024-07-05 — End: 2024-07-05

## 2024-07-05 MED ORDER — METOCLOPRAMIDE HCL 10 MG PO TABS
10 | Freq: Three times a day (TID) | ORAL | Status: DC
Start: 2024-07-05 — End: 2024-07-05

## 2024-07-05 MED ORDER — DEXTROSE 10 % IV BOLUS
Freq: Once | INTRAVENOUS | Status: DC
Start: 2024-07-05 — End: 2024-07-05

## 2024-07-05 MED ORDER — DEXTROSE 10 % IV SOLN
10 | INTRAVENOUS | Status: DC | PRN
Start: 2024-07-05 — End: 2024-07-05

## 2024-07-05 MED ORDER — ASPIRIN 81 MG PO CHEW
81 | Freq: Every day | ORAL | Status: DC
Start: 2024-07-05 — End: 2024-07-05

## 2024-07-05 MED ORDER — ATORVASTATIN CALCIUM 20 MG PO TABS
20 | Freq: Every evening | ORAL | Status: DC
Start: 2024-07-05 — End: 2024-07-05

## 2024-07-05 MED ORDER — ONDANSETRON HCL 4 MG/2ML IJ SOLN
4 | Freq: Once | INTRAMUSCULAR | Status: AC
Start: 2024-07-05 — End: 2024-07-05
  Administered 2024-07-05: 20:00:00 4 mg via INTRAVENOUS

## 2024-07-05 MED ORDER — DULOXETINE HCL 30 MG PO CPEP
30 | Freq: Every day | ORAL | Status: DC
Start: 2024-07-05 — End: 2024-07-05

## 2024-07-05 MED ORDER — ONDANSETRON HCL 4 MG/2ML IJ SOLN
4 | Freq: Four times a day (QID) | INTRAMUSCULAR | Status: DC | PRN
Start: 2024-07-05 — End: 2024-07-05

## 2024-07-05 MED ORDER — ONDANSETRON HCL 4 MG/2ML IJ SOLN
4 | Freq: Once | INTRAMUSCULAR | Status: AC
Start: 2024-07-05 — End: 2024-07-05

## 2024-07-05 MED ORDER — GLUCOSE 4 G PO CHEW
4 | ORAL | Status: DC | PRN
Start: 2024-07-05 — End: 2024-07-05

## 2024-07-05 MED ORDER — CALCIUM GLUCONATE-NACL 1-0.675 GM/50ML-% IV SOLN
1-0.675 | Freq: Once | INTRAVENOUS | Status: DC
Start: 2024-07-05 — End: 2024-07-05

## 2024-07-05 MED ORDER — INSULIN REGULAR HUMAN 100 UNIT/ML IJ SOLN
100 | Freq: Once | INTRAMUSCULAR | Status: DC
Start: 2024-07-05 — End: 2024-07-05

## 2024-07-05 MED ORDER — CLOPIDOGREL BISULFATE 75 MG PO TABS
75 | Freq: Every day | ORAL | Status: DC
Start: 2024-07-05 — End: 2024-07-05

## 2024-07-05 MED FILL — SEVELAMER CARBONATE 0.8 G PO PACK: 0.8 g | ORAL | Qty: 1 | Fill #0

## 2024-07-05 MED FILL — DEXTROSE 10 % IV SOLN: 10 % | INTRAVENOUS | Qty: 500 | Fill #0

## 2024-07-05 MED FILL — BD POSIFLUSH 0.9 % IV SOLN: 0.9 % | INTRAVENOUS | Qty: 40 | Fill #0

## 2024-07-05 MED FILL — ACETAMINOPHEN EXTRA STRENGTH 500 MG PO TABS: 500 mg | ORAL | Qty: 2 | Fill #0

## 2024-07-05 MED FILL — ONDANSETRON HCL 4 MG/2ML IJ SOLN: 4 MG/2ML | INTRAMUSCULAR | Qty: 2 | Fill #0

## 2024-07-05 MED FILL — ONDANSETRON 4 MG PO TBDP: 4 mg | ORAL | Qty: 1 | Fill #0

## 2024-07-05 MED FILL — LIDOCAINE PAIN RELIEF 4 % EX PTCH: 4 % | CUTANEOUS | Qty: 1 | Fill #0

## 2024-07-05 MED FILL — CYCLOBENZAPRINE HCL 10 MG PO TABS: 10 mg | ORAL | Qty: 1 | Fill #0

## 2024-07-05 NOTE — H&P (Signed)
 [facility]          [phone] ST. [address] [place], [place]  [address]                           HISTORY & PHYSICAL      PATIENT NAME: Melinda [name], Melinda [name]       DOB: 24-Sep-1981  MED REC NO: [id number]                       ROOM: D08  ACCOUNT NO: [id number]                       ADMIT DATE: 07/05/2024  PROVIDER: Ellouise Bathe, MD    PRESENTING COMPLAINT:  I need dialysis.      The patient is a 43 year old female who was recently admitted to this hospital from the 16th until the 22nd of September.  Her last hospital admission was for end-stage renal disease requiring dialysis.  The patient was last dialyzed on Monday.  Today, she came in because of body ache.  She has no chair for dialysis.  The patient was also hypertensive.  Urgent hemodialysis was done, and now it is scheduled for Tuesday, Thursday, and Saturday.  However, the patient does not have an outpatient hemodialysis chair.  The patient is being admitted, as her blood pressure was also on the higher side and she was vomiting.  The patient denies having any other complaints at this time.    PAST MEDICAL HISTORY:    1. End-stage renal disease.  2. Type 2 diabetes.  3. Volume overload.  4. Gastroparesis.  5. Hyperlipidemia.    SOCIOECONOMIC HISTORY:  The patient does not smoke or drink.    CURRENT MEDICATIONS:  Include 70/30 insulin  5 units before breakfast and 5 units with dinner.  She also takes Renvela  1 packet 3 times daily, Lipitor  80 mg daily, Coreg  12.5 b.i.d., Pepcid  20 b.i.d., hydralazine  50 three times daily, Cymbalta  60 mg daily, sodium bicarbonate  650 three times daily, Reglan  5 mg 3 times daily before meals, Protonix  40 mg b.i.d., metformin  XR 500 mg tablet 2 tablets daily, BuSpar  15 mg 3 times daily, Plavix  75 daily, aspirin  81 daily, and amlodipine  10 mg daily.    REVIEW OF SYSTEMS:  Negative except as mentioned in the history of present illness.  All systems were reviewed, and no other positive finding was  noticed.    PHYSICAL EXAMINATION:  GENERAL:  The patient is a 43 year old female, not in any acute distress, resting comfortably.  VITAL SIGNS:  Temperature 97.8, blood pressure 178/112, pulse 76, respirations 20.  HEENT:  Reveals pupils equally reactive to light and accommodation.  NECK:  Supple.  There is no lymphadenopathy or JVD.  CHEST:  Clear.  No wheezing.  No crackles.  CARDIOVASCULAR SYSTEM:  S1 and S2 regular.  No murmur.  No S3.  ABDOMEN:  Reveals no tenderness, no guarding, no rigidity.  CNS:  Nonfocal.  The patient is alert and oriented.  Has normal strength.  Normal reflexes.  Plantars downgoing.    LABORATORY DATA:  Reveal white count of 9.9, hemoglobin of 12.2, hematocrit 38, platelet count is 447,000.    Chemistry; sodium 135, potassium 6.5, chloride is 98, bicarb 22, gap of 15, glucose 237, BUN of 43, creatinine is 4.68, calcium  9.1, ALT 68, AST 54, alkaline phosphatase 592.    COVID and flu are negative.  Chest x-ray revealed interval improvement in the right pleural effusion and interstitial edema.  Interstitial thickening in the right lung base may represent residual edema.    Lipase is 40.    ASSESSMENT AND PLAN:    The patient is a very pleasant 43 year old female.  History is obtained by her daughter, who is sitting next to her.  She was recently admitted for end-stage renal disease, is admitted with.    1. End-stage renal disease requiring dialysis.  The patient needs a hemodialysis chair.  We will consult Case Management to arrange for that.  2. History of hypertension.  The patient's blood pressure is high.  However, she tells me she has not taken her blood pressure medications. Re ordered BP meds.  3. History of diabetes, on 70/30 insulin  which will be continued and monitor blood sugars closely.  4. History of hyperlipidemia, on statin.  5. History of hyperkalemia.  We will check potassium after dialysis.  6. Check glycohemoglobin.  7. Deep vein thrombosis prophylaxis.    Patient signed  out [facility]        ELLOUISE BATHE, MD      SK/AQS  D:  07/05/2024 17:49:53  T:  07/05/2024 18:08:49  JOB #:  [id number]

## 2024-07-05 NOTE — ED Notes (Signed)
Pt going to dialysis soon.

## 2024-07-05 NOTE — Progress Notes (Signed)
 Patient signing out AMA tells me she can't stay due to birthday of her son

## 2024-07-05 NOTE — ED Notes (Signed)
 Let MD know pt still does not want to stay

## 2024-07-05 NOTE — ED Notes (Signed)
 Pt to dialysis with transport.

## 2024-07-05 NOTE — ED Notes (Signed)
 This RN called dialysis to see when pt is going. Dialysis RN stated she is putting transport in now.

## 2024-07-05 NOTE — Consults (Signed)
 NEPHROLOGY CONSULT NOTE     Patient: Melinda [name] MRN: [id number]  PCP: Marlin Slider, APRN - NP   DOB:     Apr 21, 1981  Age:   43 y.o.  Sex:  female      Referring physician: Myrle Cough, MD  Reason for consultation:   Admission Date: 07/05/2024  9:15 AM  LOS: 0 days        ASSESSMENT and PLAN :   CKD stage V> ESRD: New HD start from last admission.  Hypertension  Fluid overload  Secondary hyperparathyroidism    Plan:  Urgent HD today then TTS  Patient does not have outpatient HD chair.  Resume home antihypertensive medication  Continue Phos binder  Discussed with acute [facility] team and patient             Subjective:   HPI: Melinda [name] is a 43 y.o. Hispanic female with a new ESRD started on HD last admission, hypertension, diabetes mellitus type 2, patient does not have assigned outpatient HD unit who presented to the ER to receive hemodialysis treatment.  Patient reported last HD treatment was on Monday.  Patient is complaining of shortness of breath and pain in entire body.  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)     Gastroparesis     Gastroparesis     GERD (gastroesophageal reflux disease)     High cholesterol     Hypertension         Past Surgical Hx:     Past Surgical History:   Procedure Laterality Date    CAPSULE ENDOSCOPY N/A 01/20/2023    ESOPHAGEAL CAPSULE ENDOSCOPY remove at 1624PM performed by Madlyn Fendt, MD at [facility] ENDOSCOPY    CHOLECYSTECTOMY, LAPAROSCOPIC N/A 02/03/2023    ROBOTIC LAPAROSCOPIC CHOLECYSTECTOMY with Indocyanine green  performed by Chrystal Elijah BRAVO, MD at [facility] MAIN OR    COLONOSCOPY N/A 01/19/2023    COLONOSCOPY DIAGNOSTIC performed by Madlyn Fendt, MD at [facility] ENDOSCOPY    INVASIVE VASCULAR N/A 10/03/2023    Angiography visceral SMA performed by Millard Oneil LABOR, MD at [facility] CARDIAC CATH LAB    INVASIVE VASCULAR N/A  10/03/2023    Ultrasound guided vascular access performed by Millard Oneil LABOR, MD at [facility] CARDIAC CATH LAB    INVASIVE VASCULAR N/A 10/03/2023    Insert stent peripheral artery performed by Millard Oneil LABOR, MD at [facility] CARDIAC CATH LAB    IR NONTUNNELED VASCULAR CATHETER > 5 YEARS  10/10/2023    IR NONTUNNELED VASCULAR CATHETER > 5 YEARS 10/10/2023 [facility] CARDIAC CATH/EP/IR LAB    IR NONTUNNELED VASCULAR CATHETER > 5 YEARS  10/10/2023    IR NONTUNNELED VASCULAR CATHETER > 5 YEARS 10/10/2023 [facility] CARDIAC CATH/EP/IR LAB    OTHER SURGICAL HISTORY Left     Rentia attachment    TUBAL LIGATION Bilateral     UPPER GASTROINTESTINAL ENDOSCOPY N/A 01/17/2023    ESOPHAGOGASTRODUODENOSCOPY performed by Madlyn Fendt, MD at [facility] ENDOSCOPY    UPPER GASTROINTESTINAL ENDOSCOPY N/A 01/17/2023    ESOPHAGOGASTRODUODENOSCOPY BIOPSY performed by Madlyn Fendt, MD at [facility] ENDOSCOPY    UPPER GASTROINTESTINAL ENDOSCOPY N/A 01/18/2023    ESOPHAGOGASTRODUODENOSCOPY performed by Madlyn Fendt, MD at [facility] ENDOSCOPY    UPPER GASTROINTESTINAL ENDOSCOPY N/A 05/13/2023    ESOPHAGOGASTRODUODENOSCOPY performed by Roseann Lonni BIRCH, MD at  [facility] ENDOSCOPY    UPPER GASTROINTESTINAL ENDOSCOPY N/A 05/13/2023    ESOPHAGOGASTRODUODENOSCOPY BIOPSY performed by Roseann Lonni BIRCH, MD at [facility] ENDOSCOPY    UPPER GASTROINTESTINAL ENDOSCOPY N/A 09/21/2023    ESOPHAGOGASTRODUODENOSCOPY performed by Madlyn Fendt, MD at [facility] ENDOSCOPY    UPPER GASTROINTESTINAL ENDOSCOPY N/A 09/21/2023    ESOPHAGOGASTRODUODENOSCOPY BIOPSY performed by Madlyn Fendt, MD at [facility] ENDOSCOPY    US  ABSCESS DRAINAGE PERITONEAL/RETROPERITONEAL [facility]  02/11/2023    US  ABSCESS DRAINAGE PERITONEAL 02/11/2023 [facility] RAD US        Medications:  Prior to Admission medications   Medication Sig Start Date End Date Taking? Authorizing Provider   lidocaine  4 % external patch Place 1 patch onto the skin daily 07/02/24   Karenann Barter, MD   sevelamer  (RENVELA ) 0.8 g PACK packet Take 1 packet by mouth 3 times daily (with meals)  07/02/24   Karenann Barter, MD   atorvastatin  (LIPITOR ) 80 MG tablet Take 1 tablet by mouth nightly    [provider]   carvedilol  (COREG ) 12.5 MG tablet Take 1 tablet by mouth 2 times daily (with meals)    [provider]   famotidine  (PEPCID ) 20 MG tablet Take 1 tablet by mouth 2 times daily    [provider]   hydrALAZINE  (APRESOLINE ) 50 MG tablet Take 1 tablet by mouth 3 times daily    [provider]   sodium bicarbonate  650 MG tablet Take 1 tablet by mouth 3 times daily 02/23/24   Muncy, Manuelita Bohr, MD   metoclopramide  (REGLAN ) 5 MG tablet Take 1 tablet by mouth 3 times daily (before meals) 02/23/24   Muncy, Manuelita Bohr, MD   pantoprazole  (PROTONIX ) 40 MG tablet Take 1 tablet by mouth 2 times daily (before meals) 02/23/24   Muncy, Manuelita Bohr, MD   sennosides-docusate sodium  (SENOKOT-S) 8.6-50 MG tablet Take 1 tablet by mouth in the morning and at bedtime 11/06/23   Do, Khoi B, MD   ondansetron  (ZOFRAN -ODT) 4 MG disintegrating tablet Take 1 tablet by mouth every 8 hours as needed for Nausea or Vomiting 11/06/23   Do, Rodrick NOVAK, MD   prochlorperazine  (COMPAZINE ) 10 MG tablet Take 1 tablet by mouth every 6 hours as needed (nausea or vomiting) 10/25/23   Orson Planas, MD   metFORMIN  (GLUCOPHAGE -XR) 500 MG extended release tablet Take 2 tablets by mouth daily (with breakfast)  Patient not taking: Reported on 06/26/2024 10/25/23   Aktig, Ziya, MD   busPIRone  (BUSPAR ) 15 MG tablet Take 15 mg by mouth 3 times daily  Patient taking differently: Take 5 mg by mouth in the morning and at bedtime 10/14/23   Tefera, Mesfin A, MD   DULoxetine  (CYMBALTA ) 60 MG extended release capsule Take 1 capsule by mouth daily 10/15/23   Tefera, Mesfin A, MD   clopidogrel  (PLAVIX ) 75 MG tablet Take 1 tablet by mouth daily 10/15/23   Tefera, Mesfin A, MD   aspirin  81 MG chewable tablet Take 1 tablet by mouth daily 10/15/23   Tefera, Mesfin A, MD   amLODIPine  (NORVASC ) 10 MG tablet Take 1 tablet by mouth daily  03/23/23   Do, Khoi B, MD       No Known Allergies    Social Hx:  reports that she has never smoked. She has never used smokeless tobacco. She reports that she does not drink alcohol and does not use drugs.     No family history on file.    Review of Systems:  A twelve point review of  system was performed today. Pertinent positives and negatives are mentioned in the HPI. The reminder of the ROS is negative and noncontributory.     Objective:    Vitals:    Vitals:    07/05/24 0908   BP: (!) 191/92   Pulse: 90   Resp: 16   Temp: 98.8 F (37.1 C)   TempSrc: Oral   SpO2: 100%   Height: 1.499 m (4' 11)     I&O's:  No intake/output data recorded.  BP (!) 191/92   Pulse 90   Temp 98.8 F (37.1 C) (Oral)   Resp 16   Ht 1.499 m (4' 11)   SpO2 100%   BMI 25.65 kg/m     Physical Exam:    GENERAL : Lying down in bed with no acute distress  HEENT: AT NC PEERLA   NECK: Supple no JVP  CVS: S1 S2 RRR, no murmur or gallops heard  RS: Diminished breath sound on both bases  ABDOMEN: soft NT ND positive BS  EXTREMITY: No edema clubbing or cyanosis, pedal pulse +  NEUROLOGY: AAA X3, no focal deficit or asterixis  Vascular access: Left upper extremity AVG      Laboratory Results:      Lab Results   Component Value Date/Time    NA 133 07/02/2024 09:50 AM    K 5.6 07/02/2024 09:50 AM    CL 99 07/02/2024 09:50 AM    CO2 23 07/02/2024 09:50 AM    BUN 38 07/02/2024 09:50 AM    CREATININE 5.16 07/02/2024 09:50 AM    GLUCOSE 179 07/02/2024 09:50 AM    CALCIUM  8.6 07/02/2024 09:50 AM          @LASTPROCAMB (POC81001;POC81003;poc81002;poc81000;POC81001F;POC81003E)@  No results found for: LABPROT, LABALBU     Lab Results   Component Value Date    WBC 7.1 07/02/2024    HGB 9.7 (L) 07/02/2024    HCT 31.7 (L) 07/02/2024    MCV 96.9 07/02/2024    PLT 323 07/02/2024    LYMPHOPCT 12.1 06/28/2024    RBC 3.27 (L) 07/02/2024    MCH 29.7 07/02/2024    MCHC 30.6 07/02/2024    RDW 15.6 (H) 07/02/2024                   Taiquan Campanaro [name],  MD  07/05/2024    Thank you for consulting [facility] in the care of your patient.    [facility]:  [email or web address]  [email or web address]    [place] Office:  [address]  [place], [place] [address]  Phone: [phone]  Fax :     [phone]    [facility] office:  [address]  [place], [place]  [address]  Phone - [phone]  Fax - [phone]

## 2024-07-05 NOTE — Discharge Instructions (Addendum)
 You should be getting dialysis every Tuesday, Thursday, Saturday.  You will need to return to the emergency department Saturday to get dialysis.  Return sooner with any new or worsening symptoms.

## 2024-07-05 NOTE — ED Notes (Signed)
 Pt just got back from Dialysis

## 2024-07-05 NOTE — ED Provider Notes (Signed)
 [facility] EMERGENCY DEPARTMENT  EMERGENCY DEPARTMENT ENCOUNTER      Pt Name: Melinda [name]  MRN: [id number]  Birthdate 02/24/1981  Date of evaluation: 07/05/2024  Provider: Donnice Melnick, MD      HISTORY OF PRESENT ILLNESS      HPI  43 year old female with a past medical history of end-stage renal disease on dialysis, diabetes, hypertension, gastroparesis, and hyperlipidemia presenting to the emergency department in need of dialysis.  Due to insurance reasons she has been instructed to come to the emergency department to get dialyzed.  Last dialysis was on Monday.  She says she was told she needed it 3 times a week but was not sure what day she was supposed to get it.  She says that today she is feeling diffuse bodyaches.  She endorses lower back pain as well that has been chronic for her.  She does endorse a bit of a runny nose and cough over the last few days but no fever.  She says this morning she ate breakfast this and is now nauseous and vomited a few times.  No diarrhea.  She has not taken any of her medications today including her antihypertensives or Reglan  that was prescribed to her.  Denies any abdominal pain.  No trauma to the extremities or swelling.  No chest pain or shortness of breath.  History obtained through a Spanish language interpreter      Nursing Notes were reviewed.    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)     Gastroparesis     Gastroparesis     GERD (gastroesophageal reflux disease)     High cholesterol     Hypertension          SURGICAL HISTORY       Past Surgical History:   Procedure Laterality Date    CAPSULE ENDOSCOPY N/A 01/20/2023    ESOPHAGEAL CAPSULE ENDOSCOPY remove at 1624PM performed by Madlyn Fendt, MD at [facility] ENDOSCOPY     CHOLECYSTECTOMY, LAPAROSCOPIC N/A 02/03/2023    ROBOTIC LAPAROSCOPIC CHOLECYSTECTOMY with Indocyanine green  performed by Chrystal Elijah BRAVO, MD at [facility] MAIN OR    COLONOSCOPY N/A 01/19/2023    COLONOSCOPY DIAGNOSTIC performed by Madlyn Fendt, MD at [facility] ENDOSCOPY    INVASIVE VASCULAR N/A 10/03/2023    Angiography visceral SMA [facility]OR[facility]    INVASIVE VASCULAR N/A 10/03/2023    Ultrasound guided vascular access performed by Millard Oneil LABOR, MD at [facility] CARDIAC CATH LAB    INVASIVE VASCULAR N/A 10/03/2023    Insert stent peripheral artery performed by Millard Oneil LABOR, MD at [facility] CARDIAC CATH LAB    IR NONTUNNELED VASCULAR CATHETER > 5 YEARS  10/10/2023    IR NONTUNNELED VASCULAR CATHETER > 5 YEARS 10/10/2023 [facility] CARDIAC CATH/EP/IR LAB    IR NONTUNNELED VASCULAR CATHETER > 5 YEARS  10/10/2023    IR NONTUNNELED VASCULAR CATHETER > 5 YEARS 10/10/2023 [facility] CARDIAC CATH/EP/IR LAB    OTHER SURGICAL HISTORY Left     Rentia attachment    TUBAL LIGATION Bilateral     UPPER GASTROINTESTINAL ENDOSCOPY N/A 01/17/2023  ESOPHAGOGASTRODUODENOSCOPY performed by Madlyn Fendt, MD at [facility] ENDOSCOPY    UPPER GASTROINTESTINAL ENDOSCOPY N/A 01/17/2023    ESOPHAGOGASTRODUODENOSCOPY BIOPSY performed by Madlyn Fendt, MD at [facility] ENDOSCOPY    UPPER GASTROINTESTINAL ENDOSCOPY N/A 01/18/2023    ESOPHAGOGASTRODUODENOSCOPY performed by Madlyn Fendt, MD at [facility] ENDOSCOPY    UPPER GASTROINTESTINAL ENDOSCOPY N/A 05/13/2023    ESOPHAGOGASTRODUODENOSCOPY performed by Roseann Lonni BIRCH, MD at [facility] ENDOSCOPY    UPPER GASTROINTESTINAL ENDOSCOPY N/A 05/13/2023    ESOPHAGOGASTRODUODENOSCOPY BIOPSY performed by Roseann Lonni BIRCH, MD at [facility] ENDOSCOPY    UPPER GASTROINTESTINAL ENDOSCOPY N/A 09/21/2023    ESOPHAGOGASTRODUODENOSCOPY performed by Madlyn Fendt, MD at [facility] ENDOSCOPY    UPPER GASTROINTESTINAL ENDOSCOPY N/A 09/21/2023    ESOPHAGOGASTRODUODENOSCOPY BIOPSY performed by Madlyn Fendt, MD at [facility] ENDOSCOPY    US  ABSCESS  DRAINAGE PERITONEAL/RETROPERITONEAL [facility]  02/11/2023    US  ABSCESS DRAINAGE PERITONEAL 02/11/2023 [facility] RAD US          CURRENT MEDICATIONS       Previous Medications    AMLODIPINE  (NORVASC ) 10 MG TABLET    Take 1 tablet by mouth daily    ASPIRIN  81 MG CHEWABLE TABLET    Take 1 tablet by mouth daily    ATORVASTATIN  (LIPITOR ) 80 MG TABLET    Take 1 tablet by mouth nightly    BUSPIRONE  (BUSPAR ) 15 MG TABLET    Take 15 mg by mouth 3 times daily    CARVEDILOL  (COREG ) 12.5 MG TABLET    Take 1 tablet by mouth 2 times daily (with meals)    CLOPIDOGREL  (PLAVIX ) 75 MG TABLET    Take 1 tablet by mouth daily    DULOXETINE  (CYMBALTA ) 60 MG EXTENDED RELEASE CAPSULE    Take 1 capsule by mouth daily    FAMOTIDINE  (PEPCID ) 20 MG TABLET    Take 1 tablet by mouth 2 times daily    HYDRALAZINE  (APRESOLINE ) 50 MG TABLET    Take 1 tablet by mouth 3 times daily    LIDOCAINE  4 % EXTERNAL PATCH    Place 1 patch onto the skin daily    METFORMIN  (GLUCOPHAGE -XR) 500 MG EXTENDED RELEASE TABLET    Take 2 tablets by mouth daily (with breakfast)    METOCLOPRAMIDE  (REGLAN ) 5 MG TABLET    Take 1 tablet by mouth 3 times daily (before meals)    ONDANSETRON  (ZOFRAN -ODT) 4 MG DISINTEGRATING TABLET    Take 1 tablet by mouth every 8 hours as needed for Nausea or Vomiting    PANTOPRAZOLE  (PROTONIX ) 40 MG TABLET    Take 1 tablet by mouth 2 times daily (before meals)    PROCHLORPERAZINE  (COMPAZINE ) 10 MG TABLET    Take 1 tablet by mouth every 6 hours as needed (nausea or vomiting)    SENNOSIDES-DOCUSATE SODIUM  (SENOKOT-S) 8.6-50 MG TABLET    Take 1 tablet by mouth in the morning and at bedtime    SEVELAMER  (RENVELA ) 0.8 G PACK PACKET    Take 1 packet by mouth 3 times daily (with meals)    SODIUM BICARBONATE  650 MG TABLET    Take 1 tablet by mouth 3 times daily       ALLERGIES     Patient has no known allergies.    FAMILY HISTORY     No family history on file.       SOCIAL HISTORY       Social History     Socioeconomic History    Marital status: Married   Tobacco  Use  Smoking status: Never    Smokeless tobacco: Never   Vaping Use    Vaping status: Never Used   Substance and Sexual Activity    Alcohol use: Never    Drug use: Never    Sexual activity: Defer     Social Drivers of Health     Financial Resource Strain: Low Risk  (05/02/2024)    Received from [facility]    Overall Financial Resource Strain (CARDIA)     Difficulty of Paying Living Expenses: Not hard at all   Recent Concern: Financial Resource Strain - High Risk (04/02/2024)    Received from [facility]    Overall Financial Resource Strain (CARDIA)     Difficulty of Paying Living Expenses: Hard   Food Insecurity: No Food Insecurity (06/26/2024)    Hunger Vital Sign     Worried About Running Out of Food in the Last Year: Never true     Ran Out of Food in the Last Year: Never true   Recent Concern: Food Insecurity - Food Insecurity Present (04/02/2024)    Received from [facility]    Hunger Vital Sign     Within the past 12 months, you worried that your food would run out before you got the money to buy more.: Often true     Within the past 12 months, the food you bought just didn't last and you didn't have money to get more.: Often true   Transportation Needs: No Transportation Needs (06/26/2024)    PRAPARE - Therapist, art (Medical): No     Lack of Transportation (Non-Medical): No   Recent Concern: Transportation Needs - Unmet Transportation Needs (05/02/2024)    Received from [facility] - Transportation     In the past 12 months, has lack of transportation kept you from medical appointments or from getting medications?: No     Lack of Transportation (Non-Medical): Yes   Housing Stability: Low Risk  (06/26/2024)    Housing Stability Vital Sign     Unable to Pay for Housing in the Last Year: No     Number of Times Moved in the Last Year: 0     Homeless in the Last Year: No         PHYSICAL EXAM         Body mass  index is 25.65 kg/m.    Physical Exam  Constitutional:       Comments: Appears in no acute distress on initial exam but that had an episode of vomiting into emesis bag   Eyes:      General: No scleral icterus.     Extraocular Movements: Extraocular movements intact.   Cardiovascular:      Rate and Rhythm: Normal rate and regular rhythm.      Heart sounds: No murmur heard.  Pulmonary:      Effort: Pulmonary effort is normal. No respiratory distress.      Breath sounds: Normal breath sounds. No wheezing or rales.   Abdominal:      General: There is no distension.      Palpations: Abdomen is soft.      Tenderness: There is no abdominal tenderness.   Musculoskeletal:      Comments: No edema in the extremities or focal areas of tenderness.  Tenderness throughout the lower back   Skin:     General: Skin is warm and dry.  Neurological:      Comments: Awake and alert.  GCS 15             EMERGENCY DEPARTMENT COURSE and DIFFERENTIAL DIAGNOSIS/MDM:   Vitals:    Vitals:    07/05/24 0908   BP: (!) 191/92   Pulse: 90   Resp: 16   Temp: 98.8 F (37.1 C)   TempSrc: Oral   SpO2: 100%   Height: 1.499 m (4' 11)         Medical Decision Making  Amount and/or Complexity of Data Reviewed  Labs: ordered.  Radiology: ordered.  ECG/medicine tests: ordered.    Risk  OTC drugs.  Prescription drug management.      Patient presenting with the above chief complaint.  She is hypertensive but otherwise vitals are stable.  She had an episode of vomiting in triage.  Her abdomen is benign.  She has no swelling or tenderness in her extremities.  Getting screening labs and EKG to assess for potential hyperkalemia given she has not had dialysis since Monday.  Given the bodyaches cough and runny nose I am getting a chest x-ray and swabbing her for COVID and flu.  She does have a history of gastroparesis and says this nausea and vomiting feels similar to prior episodes of this.  Should be given antiemetics and nephrology will be  consulted      REASSESSMENT     ED Course as of 07/05/24 1448   Thu Jul 05, 2024   0924 EKG shows a normal sinus rhythm with a rate of 92.  QTc 482.  Peaked T waves not appreciated.  No STEMI.  No arrhythmias [MM]   1112 Potassium is 6.5 but looks to be hemolyzed.  Getting repeat K.  Giving calcium  gluconate in the meantime.  She will be given insulin  as well given her hyperglycemia which would also treat hyperkalemia [MM]   1428 Repeat potassium was indeed 6.5.  She has been sent to dialysis and care will be signed out to coming physician with instructions to reassess the patient after she returns from dialysis [MM]      ED Course User Index  [MM] Myrle Cough, MD         CONSULTS:  IP CONSULT TO NEPHROLOGY    PROCEDURES:     Critical Care    Performed by: Myrle Cough, MD  Authorized by: Myrle Cough, MD    Critical care provider statement:     Critical care time (minutes):  60    Critical care time was exclusive of:  Separately billable procedures and treating other patients    Critical care was necessary to treat or prevent imminent or life-threatening deterioration of the following conditions: Hyperkalemia requiring medications for temporization.  Nephrology consultation for dialysis.    Critical care was time spent personally by me on the following activities:  Blood draw for specimens, development of treatment plan with patient or surrogate, discussions with consultants, evaluation of patient's response to treatment, examination of patient, obtaining history from patient or surrogate, ordering and performing treatments and interventions, ordering and review of laboratory studies, ordering and review of radiographic studies, pulse oximetry, re-evaluation of patient's condition and review of old charts    I assumed direction of critical care for this patient from another provider in my specialty: no              (Please note that portions of this note were completed with a voice recognition program.   Efforts  were made to edit the dictations but occasionally words are mis-transcribed.)    Donnice Melnick, MD (electronically signed)  Emergency Attending Physician              Melnick Donnice, MD  07/05/24 [phone]

## 2024-07-05 NOTE — ED Triage Notes (Signed)
 Interpreter used during triage.     Pt arrives to the ED reporting she needs dialysis and does not have a place to get dialysis at this time, pt also reporting all over body pain and states it makes it hard for her to stand. Pt reports her last dialysis was Monday when she was last here. Pt not sure when she is supposed to be getting her dialysis and was only told she needed it three days a week.

## 2024-07-05 NOTE — ED Notes (Signed)
 Dr. Welton Flakes at bedside

## 2024-07-05 NOTE — ED Notes (Signed)
 This RN messaged MD about patient wanting to talk to him

## 2024-07-05 NOTE — Other (Signed)
 Hep B verification performed for (RN): Ladell, M RN  Hep B results, dates, and Primary Source: Negative 06/27/2024; Susceptible 06/28/2024; Epic  Hepatitis B Surface Ag   Date/Time Value Ref Range Status   06/27/2024 01:36 PM NONREACTIVE NR   Final     Hep B S Ag Interp   Date/Time Value Ref Range Status   03/16/2023 08:19 AM Negative NEG   Final     Hep B S Ab   Date/Time Value Ref Range Status   06/28/2024 06:07 AM <3.50 mIU/mL Final     Comment:        <10.00 mIU/mL     Non-Immune  =>10.00 mIU/mL    Individual is considered to be immune to infection with HBV       Machine disinfect process: Heat

## 2024-07-05 NOTE — Other (Signed)
 Primary RN SBAR: Wanda, RN   Patient Education provided: Missed HD  Preferred Education method and Primary language: English/verbal  Dialysis consent: yes  Hospital General Consent Verified: yes  Hospital associated wait time; reason: waited for Covid result  Hepatitis B Surface Ag   Date/Time Value Ref Range Status   06/27/2024 01:36 PM NONREACTIVE NR   Final     Hep B S Ag Interp   Date/Time Value Ref Range Status   03/16/2023 08:19 AM Negative NEG   Final     Hep B S Ab   Date/Time Value Ref Range Status   06/28/2024 06:07 AM <3.50 mIU/mL Final     Comment:        <10.00 mIU/mL     Non-Immune  =>10.00 mIU/mL    Individual is considered to be immune to infection with HBV       Hep B results, dates, and Primary Source: Negative 06/27/2024 [facility] 06/28/2024 Hospital EPic  Hep B dual verification performed by (RN): Americo CORDOBA RN   Machine disinfect process: Acid/heat       07/05/24 1207   Treatment   Treatment Goal 3.5H/3L   Observations & Evaluations   Level of Consciousness 0   Oriented X x4   Heart Rhythm Regular   Respiratory Quality/Effort Unlabored   O2 Device None (Room air)   Skin Color   (appropriate for ethnicity)   Skin Condition/Temp Dry;Warm   Appetite Good   Abdomen Inspection Soft   Bowel Sounds (All Quadrants) Present   Last BM (including prior to admit) 07/04/24   Edema None   Vital Signs   BP (!) 199/98   Temp 97.4 F (36.3 C)   Pulse 96   Respirations 18   SpO2 98 %   Pain Assessment   Pain Assessment 0-10   Pain Level 8   Patient's Stated Pain Goal 0 - No pain   Pain Location Generalized   Pain Descriptors Aching   Technical Checks   Dialysis Machine No. [id number]   RO Machine Number [id number]   Dialyzer Lot No. [id number]   Tubing Lot Number [id number]   All Connections Secure Yes   NS Bag Yes   Saline Line Double Clamped Yes   Dialyzer F-180   Prime Volume (mL) 250 mL   ICEBOAT I;C;E;B;O;A;T   RO Machine Log Sheet Completed Yes   Machine Alarm Self Test Completed;Passed   Air Foam Detector  Tested;pH Reading;Proper Function   Extracorporeal Chemical engineer Conductivity 13.9   Manual Ph 7.4   Bleach Test (Neg) Yes   Bath Temperature 98.6 F (37 C)   Dialysis Bath   K+ (Potassium) 2   Ca+ (Calcium ) 2.5   Na+ (Sodium) 138   HCO3 (Bicarb) 35   Bicarbonate Concentrate Lot No. [id number]   Acid Concentrate Lot No. [id number]   Handoff   Communication Given   (Pre HD)   Handoff Given To Melanie, RN   Handoff Received From Hardy, RN   Handoff Communication Telephone        07/05/24 1215   Hemodialysis Fistula/Graft Arteriovenous fistula Left Arm   No placement date or time found.   Present on Admission/Arrival: Yes  Access Type: Arteriovenous fistula  Orientation: Left  Access Location: Arm   Site Assessment Clean, dry & intact   Thrill Present   Bruit Present   Status Accessed   Venous Needle Size 15 G   Arterial Needle Size 15 G  Accessed By: M.Cedillo, RN   Access Attempts  1   Access Interventions Chlorhexidine;Needles taped to patient        07/05/24 1217   During Hemodialysis Assessment   BP (!) 194/91   Pulse 94   Blood Flow Rate (ml/min) 350 ml/min   Arterial Pressure (mmHg) -130 mmHg   Venous Pressure (mmHg) 130   TMP 40   DFR 500   Comments   (Hd initiated)   Access Visible Yes   Ultrafiltration Rate (ml/hr) 1000 ml/hr   Ultrafiltration Removed (ml) 0 ml        07/05/24 1530   During Hemodialysis Assessment   BP (!) 167/94   Pulse 56   Blood Flow Rate (ml/min) 400 ml/min   Arterial Pressure (mmHg) -190 mmHg   Venous Pressure (mmHg) 180   TMP 40   DFR 600   Comments   (HD terminated patient vomitting, reports back pain intensity 10,   primary RN notified.)   Access Visible Yes   Ultrafiltration Rate (ml/hr) 1000 ml/hr   Ultrafiltration Removed (ml) 3386 ml     POST HD       07/05/24 1550   Observations & Evaluations   Level of Consciousness 0   Oriented X x4   Heart Rhythm Regular   Respiratory Quality/Effort Unlabored   O2 Device None (Room air)   Skin Color    (appropriate for ethnicity)   Skin Condition/Temp Dry;Warm   Appetite Good   Abdomen Inspection Soft   Bowel Sounds (All Quadrants) Present   Vital Signs   BP (!) 178/112   Temp 97.8 F (36.6 C)   Pulse 76   Respirations 20   SpO2 97 %   Pain Assessment   Pain Assessment 0-10   Pain Level 10   Patient's Stated Pain Goal 0 - No pain   Pain Location Back;Generalized   Hemodialysis Fistula/Graft Arteriovenous fistula Left Arm   No placement date or time found.   Present on Admission/Arrival: Yes  Access Type: Arteriovenous fistula  Orientation: Left  Access Location: Arm   Site Assessment Clean, dry & intact   Thrill Present   Bruit Present   Status Deaccessed   Post-Hemodialysis Assessment   Post-Treatment Procedures Blood returned;Access bleeding time < 10 minutes   Art therapist   Rinseback Volume (ml) 250 ml   Blood Volume Processed (Liters) 76.2 L   Dialyzer Clearance Moderately streaked   Duration of Treatment (minutes) 195 minutes   Hemodialysis Intake (ml) 500 ml   Hemodialysis Output (ml) 3386 ml   NET Removed (ml) 2886   Tolerated Treatment Good   Patient Disposition Remain in ICU/ED   Handoff   Communication Given   (post HD)   Handoff Given To Christine, RN   Handoff Received From [place], RN   Handoff Communication Telephone     Primary RN was notified that patient is throwing up and reports intense back pain and body pain,and requested to bring medication to dialysis suite prior to treatment end. This dialysis RN terminated 15 mins early as patient  requested and stated she doesn't feel really good, Sent patient back to ED so patient can receive medication. All possible blood returned. Patient returned to ED via transport. Reports given to primary RN.

## 2024-07-05 NOTE — ED Provider Notes (Signed)
 Patient signed out to me by Dr. Myrle at [phone], received dialysis.  She was seen by nephrology while in the department and they still have not established a seat at [facility] for her yet, completed a full session of dialysis after which she had intractable vomiting refractory to IV Zofran .  She also continued to be hypertensive to the 190s/110s which may be contributory.  I have ordered IV hydralazine .  She will require admission to our medical service for management of hypertensive crisis and intractable vomiting.  Discussed with the admitting hospitalist.    Perfect Serve Consult for Admission  5:37 PM    ED Room Number:   D08/D08  Patient Name and age:   Melinda [name] 43 y.o.  female  Working Diagnosis:     1. Hyperkalemia    2. Intractable nausea and vomiting        COVID-19 Suspicion:   No  Sepsis present:    No   Code Status:     Full Code  Readmission:    No  Isolation Requirements:  no  Recommended LOC:   telemetry  Department:    [facility] - [phone]  Other:     Discussed with Dr. Fernand at time of admission       Carvin Eden, MD  07/06/24 1815

## 2024-07-05 NOTE — Consults (Signed)
 Session ID: [id number]  Session Duration: 14 minutes  Language: Spanish  Interpreter ID: #[id number]  Interpreter Name: Gustav

## 2024-07-05 NOTE — Care Coordination-Inpatient (Signed)
 07/06/24  9:02 AM    CM received consult for pt for no insurance for being seen yesterday in the ED. CM reviewed EMR and noted pt recently denied medicaid however in appeals process, has reapplied and also is pending financial aide application with [employer]. CM LV with patient asking if there are any additional resources needed. CM consult cleared, All CM needs met.    Lauraine Sabina MA, BSW, ACM-SW  [facility]  Case Manager    Available via [facility]

## 2024-07-06 LAB — ALKALINE PHOSPHATASE, ISOENZYMES
Alk Phos Bone Fract: 17 % (ref 14–68)
Alk Phos Intestine Fract: 0 % (ref 0–18)
Alk Phos Liver Fract: 83 % (ref 18–85)
Alkaline Phosphatase: 791 IU/L — ABNORMAL HIGH (ref 41–116)

## 2024-07-09 ENCOUNTER — Inpatient Hospital Stay
Admit: 2024-07-09 | Discharge: 2024-07-10 | Disposition: A | Discharge status: Home / Self Care | Arrived: VH | Attending: Emergency Medicine

## 2024-07-09 DIAGNOSIS — K3184 Gastroparesis: Secondary | ICD-10-CM

## 2024-07-09 LAB — RENAL FUNCTION PANEL
Albumin: 3.3 g/dL — ABNORMAL LOW (ref 3.5–5.2)
Anion Gap: 18 mmol/L — ABNORMAL HIGH (ref 2–14)
BUN/Creatinine Ratio: 10 — ABNORMAL LOW (ref 12–20)
BUN: 65 mg/dL — ABNORMAL HIGH (ref 6–20)
CO2: 19 mmol/L — ABNORMAL LOW (ref 20–29)
Calcium: 9.1 mg/dL (ref 8.6–10.0)
Chloride: 96 mmol/L — ABNORMAL LOW (ref 98–107)
Creatinine: 6.3 mg/dL — ABNORMAL HIGH (ref 0.60–1.00)
Est, Glom Filt Rate: 8 ml/min/1.73m2 — ABNORMAL LOW (ref >59)
Glucose: 270 mg/dL — ABNORMAL HIGH (ref 65–100)
Phosphorus: 7.9 mg/dL — ABNORMAL HIGH (ref 2.5–4.5)
Potassium: 6.3 mmol/L — ABNORMAL HIGH (ref 3.5–5.1)
Sodium: 133 mmol/L — ABNORMAL LOW (ref 136–145)

## 2024-07-09 LAB — CBC WITH AUTO DIFFERENTIAL
Basophils %: 1.1 % — ABNORMAL HIGH (ref 0.0–1.0)
Basophils %: 1.2 % — ABNORMAL HIGH (ref 0.0–1.0)
Basophils Absolute: 0.12 K/UL — ABNORMAL HIGH (ref 0.00–0.10)
Basophils Absolute: 0.09 K/UL (ref 0.00–0.10)
Eosinophils %: 13.8 % — ABNORMAL HIGH (ref 0.0–7.0)
Eosinophils %: 12.3 % — ABNORMAL HIGH (ref 0.0–7.0)
Eosinophils Absolute: 1.48 K/UL — ABNORMAL HIGH (ref 0.00–0.40)
Eosinophils Absolute: 0.93 K/UL — ABNORMAL HIGH (ref 0.00–0.40)
Hematocrit: 37.2 % (ref 35.0–47.0)
Hematocrit: 36 % (ref 35.0–47.0)
Hemoglobin: 12.1 g/dL (ref 11.5–16.0)
Hemoglobin: 11.9 g/dL (ref 11.5–16.0)
Immature Granulocytes %: 0.2 % (ref 0.0–0.5)
Immature Granulocytes %: 0.3 % (ref 0.0–0.5)
Immature Granulocytes Absolute: 0.02 K/UL (ref 0.00–0.04)
Immature Granulocytes Absolute: 0.02 K/UL (ref 0.00–0.04)
Lymphocytes %: 12.8 % (ref 12.0–49.0)
Lymphocytes %: 15 % (ref 12.0–49.0)
Lymphocytes Absolute: 1.37 K/UL (ref 0.80–3.50)
Lymphocytes Absolute: 1.13 K/UL (ref 0.80–3.50)
MCH: 30.3 pg (ref 26.0–34.0)
MCH: 29.9 pg (ref 26.0–34.0)
MCHC: 32.5 g/dL (ref 30.0–36.5)
MCHC: 33.1 g/dL (ref 30.0–36.5)
MCV: 93 FL (ref 80.0–99.0)
MCV: 90.5 FL (ref 80.0–99.0)
MPV: 9.3 FL (ref 8.9–12.9)
MPV: 9 FL (ref 8.9–12.9)
Monocytes %: 6.1 % (ref 5.0–13.0)
Monocytes %: 8.8 % (ref 5.0–13.0)
Monocytes Absolute: 0.65 K/UL (ref 0.00–1.00)
Monocytes Absolute: 0.66 K/UL (ref 0.00–1.00)
Neutrophils %: 66 % (ref 32.0–75.0)
Neutrophils %: 62.4 % (ref 32.0–75.0)
Neutrophils Absolute: 7.06 K/UL (ref 1.80–8.00)
Neutrophils Absolute: 4.71 K/UL (ref 1.80–8.00)
Nucleated RBCs: 0 /100{WBCs}
Nucleated RBCs: 0 /100{WBCs}
Platelets: 413 K/uL — ABNORMAL HIGH (ref 150–400)
Platelets: 351 K/uL (ref 150–400)
RBC: 4 M/uL (ref 3.80–5.20)
RBC: 3.98 M/uL (ref 3.80–5.20)
RDW: 14.8 % — ABNORMAL HIGH (ref 11.5–14.5)
RDW: 14.6 % — ABNORMAL HIGH (ref 11.5–14.5)
WBC: 10.7 K/uL (ref 3.6–11.0)
WBC: 7.5 K/uL (ref 3.6–11.0)
nRBC: 0 K/uL (ref 0.00–0.01)
nRBC: 0 K/uL (ref 0.00–0.01)

## 2024-07-09 LAB — MAGNESIUM: Magnesium: 1.7 mg/dL (ref 1.6–2.6)

## 2024-07-09 LAB — COMPREHENSIVE METABOLIC PANEL
ALT: 53 U/L — ABNORMAL HIGH (ref 10–35)
AST: 43 U/L — ABNORMAL HIGH (ref 10–35)
Albumin/Globulin Ratio: 0.8 — ABNORMAL LOW (ref 1.1–2.2)
Albumin: 3.1 g/dL — ABNORMAL LOW (ref 3.5–5.2)
Alk Phosphatase: 457 U/L — ABNORMAL HIGH (ref 35–104)
Anion Gap: 14 mmol/L (ref 2–14)
BUN/Creatinine Ratio: 7 — ABNORMAL LOW (ref 12–20)
BUN: 20 mg/dL (ref 6–20)
CO2: 27 mmol/L (ref 20–29)
Calcium: 7.7 mg/dL — ABNORMAL LOW (ref 8.6–10.0)
Chloride: 97 mmol/L — ABNORMAL LOW (ref 98–107)
Creatinine: 2.73 mg/dL — ABNORMAL HIGH (ref 0.60–1.00)
Est, Glom Filt Rate: 22 ml/min/1.73m2 — ABNORMAL LOW (ref >59)
Globulin: 4 g/dL (ref 2.0–4.0)
Glucose: 158 mg/dL — ABNORMAL HIGH (ref 65–100)
Potassium: 3.2 mmol/L — ABNORMAL LOW (ref 3.5–5.1)
Sodium: 138 mmol/L (ref 136–145)
Total Bilirubin: 0.3 mg/dL (ref 0.0–1.2)
Total Protein: 7.1 g/dL (ref 6.4–8.3)

## 2024-07-09 LAB — TROPONIN
Troponin T: 155 ng/L (ref 0–14)
Troponin T: 171 ng/L (ref 0–14)

## 2024-07-09 LAB — PHOSPHORUS: Phosphorus: 2.6 mg/dL (ref 2.5–4.5)

## 2024-07-09 LAB — EXTRA TUBES HOLD

## 2024-07-09 LAB — LIPASE: Lipase: 62 U/L — ABNORMAL HIGH (ref 13–60)

## 2024-07-09 LAB — C-REACTIVE PROTEIN: CRP: <0.3 mg/dL (ref 0.0–0.5)

## 2024-07-09 MED ORDER — ONDANSETRON HCL 4 MG/2ML IJ SOLN
4 | Freq: Four times a day (QID) | INTRAMUSCULAR | Status: DC | PRN
Start: 2024-07-09 — End: 2024-07-09
  Administered 2024-07-09: 15:00:00 8 mg via INTRAVENOUS

## 2024-07-09 MED ORDER — KETOROLAC TROMETHAMINE 30 MG/ML IJ SOLN
30 | INTRAMUSCULAR | Status: DC
Start: 2024-07-09 — End: 2024-07-09

## 2024-07-09 MED ORDER — DIPHENHYDRAMINE HCL 50 MG/ML IJ SOLN
50 | INTRAMUSCULAR | Status: DC
Start: 2024-07-09 — End: 2024-07-09

## 2024-07-09 MED ORDER — HYDROMORPHONE HCL PF 1 MG/ML IJ SOLN
1 | INTRAMUSCULAR | Status: DC
Start: 2024-07-09 — End: 2024-07-09

## 2024-07-09 MED ORDER — KETOROLAC TROMETHAMINE 30 MG/ML IJ SOLN
30 | INTRAMUSCULAR | Status: AC
Start: 2024-07-09 — End: 2024-07-09
  Administered 2024-07-09: 15:00:00 30 mg via INTRAVENOUS

## 2024-07-09 MED ORDER — ONDANSETRON 4 MG PO TBDP
4 | Freq: Once | ORAL | Status: DC
Start: 2024-07-09 — End: 2024-07-09

## 2024-07-09 MED ORDER — PROCHLORPERAZINE EDISYLATE 10 MG/2ML IJ SOLN
10 | Freq: Four times a day (QID) | INTRAMUSCULAR | Status: DC | PRN
Start: 2024-07-09 — End: 2024-07-09
  Administered 2024-07-09: 21:00:00 10 mg via INTRAVENOUS

## 2024-07-09 MED ORDER — HYDROMORPHONE HCL PF 1 MG/ML IJ SOLN
1 | INTRAMUSCULAR | Status: AC
Start: 2024-07-09 — End: 2024-07-09
  Administered 2024-07-09: 15:00:00 1 mg via INTRAVENOUS

## 2024-07-09 MED ORDER — LIDOCAINE 4 % EX PTCH
4 | CUTANEOUS | Status: DC
Start: 2024-07-09 — End: 2024-07-09
  Administered 2024-07-09: 21:00:00 2 via TRANSDERMAL

## 2024-07-09 MED ORDER — PROCHLORPERAZINE EDISYLATE 10 MG/2ML IJ SOLN
10 | Freq: Once | INTRAMUSCULAR | Status: DC
Start: 2024-07-09 — End: 2024-07-09

## 2024-07-09 MED ORDER — ONDANSETRON HCL 4 MG/2ML IJ SOLN
4 | Freq: Once | INTRAMUSCULAR | Status: DC
Start: 2024-07-09 — End: 2024-07-09

## 2024-07-09 MED ORDER — HYDROMORPHONE 0.5MG/0.5ML IJ SOLN
1 | Status: AC
Start: 2024-07-09 — End: 2024-07-09
  Administered 2024-07-09: 22:00:00 0.5 mg via INTRAVENOUS

## 2024-07-09 MED FILL — LIDOCAINE PAIN RELIEF 4 % EX PTCH: 4 % | CUTANEOUS | Qty: 1 | Fill #0

## 2024-07-09 MED FILL — PROCHLORPERAZINE EDISYLATE 10 MG/2ML IJ SOLN: 10 MG/2ML | INTRAMUSCULAR | Qty: 2 | Fill #0

## 2024-07-09 MED FILL — ONDANSETRON HCL 4 MG/2ML IJ SOLN: 4 MG/2ML | INTRAMUSCULAR | Qty: 2 | Fill #0

## 2024-07-09 MED FILL — KETOROLAC TROMETHAMINE 30 MG/ML IJ SOLN: 30 mg/mL | INTRAMUSCULAR | Qty: 1 | Fill #0

## 2024-07-09 MED FILL — HYDROMORPHONE HCL 1 MG/ML IJ SOLN: 1 mg/mL | INTRAMUSCULAR | Qty: 1 | Fill #0

## 2024-07-09 MED FILL — DIPHENHYDRAMINE HCL 50 MG/ML IJ SOLN: 50 mg/mL | INTRAMUSCULAR | Qty: 1 | Fill #0

## 2024-07-09 MED FILL — HYDROMORPHONE HCL 1 MG/ML IJ SOLN: 1 mg/mL | INTRAMUSCULAR | Qty: 0.5 | Fill #0

## 2024-07-09 NOTE — ED Notes (Signed)
Pt taken to dialysis 

## 2024-07-09 NOTE — Other (Signed)
 Primary RN SBAR: Ronal, RN  Incapacitated Nurse edu. provided: yes  Patient Education provided: Missed HD  Preferred Education method and Primary language: English/ Spanish/ verbal  Dialysis consent: yes  Hospital General Consent Verified: yes  Hospital associated wait time; reason:   Hepatitis B Surface Ag   Date/Time Value Ref Range Status   06/27/2024 01:36 PM NONREACTIVE NR   Final     Hep B S Ag Interp   Date/Time Value Ref Range Status   03/16/2023 08:19 AM Negative NEG   Final     Hep B S Ab   Date/Time Value Ref Range Status   06/28/2024 06:07 AM <3.50 mIU/mL Final     Comment:        <10.00 mIU/mL     Non-Immune  =>10.00 mIU/mL    Individual is considered to be immune to infection with HBV       Hep B results, dates, and Primary Source: Negative 09/17/2025Susc. 06/28/2024 Hospital Epic  Hep B dual verification performed by (RN): LOIS Castellani, RN  Machine disinfect process:Acid/ heat      Pt arrived to suite for Urgent HD, with nausea and vomiting, as per primary RN, meds already given in ER       07/09/24 1140   Treatment   Treatment Goal 4H/3-4L   Observations & Evaluations   Level of Consciousness 0   Oriented X x4   Heart Rhythm Regular   Respiratory Quality/Effort Unlabored   O2 Device None (Room air)   Skin Color   (appropriate for ethnicity)   Skin Condition/Temp Dry;Warm   Appetite Nausea   Abdomen Inspection Soft   Edema None   Vital Signs   BP (!) 204/107   Temp 98.6 F (37 C)   Pulse 96   Respirations 18   SpO2 98 %   Technical Checks   Dialysis Machine No. [id number]   RO Machine Number [id number]   Dialyzer Lot No. [id number]   Tubing Lot Number [id number]   All Connections Secure Yes   NS Bag Yes   Saline Line Double Clamped Yes   Dialyzer F-180   Prime Volume (mL) 250 mL   ICEBOAT I;C;E;B;O;A;T   RO Machine Log Sheet Completed Yes   Machine Alarm Self Test Passed;Completed   Air Foam Detector Tested;Proper Function;pH Reading   Sport and exercise psychologist Conductivity  13.7   Manual Ph 7.4   Bleach Test (Neg) Yes   Bath Temperature 98.6 F (37 C)   Hemodialysis Fistula/Graft Arteriovenous fistula Left Arm   No placement date or time found.   Present on Admission/Arrival: Yes  Access Type: Arteriovenous fistula  Orientation: Left  Access Location: Arm   Site Assessment Clean, dry & intact   Thrill Present   Bruit Present   Status Accessed   Venous Needle Size 15 G   Arterial Needle Size 15 G   Accessed By: M.Cedillo, RN   Access Attempts  1   Access Interventions Needles taped to patient;Chlorhexidine   Dialysis Bath   K+ (Potassium) 1  (following sliding scale patient K= 6.3)   Ca+ (Calcium ) 2.5   Na+ (Sodium) 138   HCO3 (Bicarb) 35   Bicarbonate Concentrate Lot No. [id number]   Acid Concentrate Lot No. [id number]   Handoff   Communication Given   (Pre HD)   Handoff Given To Melanie, RN   Handoff Received From MacArthur, RN   Handoff Communication Telephone     Each access  site disinfected for 60 seconds per site with alcohol swabs per P&P. Cannulated with 15G needles x2 and secured with paper tape.   +aspiration/+flushed.     Patient's consent, code status, medications checked.                      Safety checks complete, time out performed.   assessed, +bruit/thrill, no redness, warmth or drainage noted. Skin prepped per procedure using chloraprep scrub x 30 sec, followed by 30 sec dry time each site. Cannulated using 15g needles each site and secured with tape. +flash/aspiration/flush. HD initiated. Access visible, lines secure. Medications reviewed.        07/09/24 1145   During Hemodialysis Assessment   BP (!) 197/109   Pulse 98   Blood Flow Rate (ml/min) 350 ml/min   Arterial Pressure (mmHg) -150 mmHg   Venous Pressure (mmHg) 180   TMP 60   DFR 500   Comments   (HD initiated)   Access Visible Yes   Ultrafiltration Rate (ml/hr) 870 ml/hr   Ultrafiltration Removed (ml) 0 ml        07/09/24 1430   During Hemodialysis Assessment   BP 115/70   Pulse 80   Blood Flow Rate (ml/min) 400  ml/min   Arterial Pressure (mmHg) -180 mmHg   Venous Pressure (mmHg) 200   TMP 60   DFR 600   Comments   (report with  chest pain , sudden new onset - called RRT.)   Access Visible Yes   Ultrafiltration Rate (ml/hr) 870 ml/hr   Ultrafiltration Removed (ml) 2417 ml     END TX   07/09/24 1432   Observations & Evaluations   Level of Consciousness 0   Respiratory Quality/Effort Labored   O2 Device None (Room air)   Skin Condition/Temp Dry;Warm   Appetite Nausea   Abdomen Inspection Soft   Vital Signs   BP (!) 157/86   Temp 97.9 F (36.6 C)   Pulse 96   Respirations 18   Pain Assessment   Pain Assessment 0-10   Pain Level 10   Patient's Stated Pain Goal 0 - No pain   Pain Location Chest   Pain Orientation Mid   Pain Descriptors Sharp   During Hemodialysis Assessment   Comments   (Treatment stopped as per RRT MD and will transfer patient back to ER for further management and evaluation.)   Hemodialysis Fistula/Graft Arteriovenous fistula Left Arm   No placement date or time found.   Present on Admission/Arrival: Yes  Access Type: Arteriovenous fistula  Orientation: Left  Access Location: Arm   Site Assessment Clean, dry & intact   Thrill Present   Bruit Present   Status Deaccessed   Post-Hemodialysis Assessment   Post-Treatment Procedures Blood returned;Access bleeding time < 10 minutes   Machine Disinfection Process Acid/Vinegar Clean;Heat Disinfect;Exterior Engineer, agricultural Volume (ml) 250 ml   Blood Volume Processed (Liters) 60.8 L   Dialyzer Clearance Moderately streaked   Duration of Treatment (minutes) 180 minutes   Hemodialysis Intake (ml) 500 ml   Hemodialysis Output (ml) 2417 ml   NET Removed (ml) 1917   Tolerated Treatment Fair   Patient Response to Treatment tolerated fairly   Physician Notified Yes   Patient Disposition Remain in ICU/ED   Provider Notification   Reason for Communication Evaluate   Provider Name Dr. Jethava A   Provider Notification Rapid Response Team   Method of Communication    (secure network)  Response No new orders   Handoff   Communication Given   (Post HD)   Handoff Given To [place]ville, RN   Handoff Received From Kathleen, RN   Handoff Communication Face to Face        HD treatment terminated early d/t RRT was called patient reported sudden new onset chest pain intensity 10.  With an  1 hour and 10 mins  remaining on treatment time with 2L UF removal.  All possible blood returned to the patient. De-cannulated and pressure held until hemostasis was achieved.   Nephrology made aware.  Patient transported back to ER with accompanied with this RN and report given to primary RN.

## 2024-07-09 NOTE — Significant Event (Addendum)
 Responded to rapid response with chest pain around 2:30 PM.  Patient was at the tail end of dialysis, having received at least 2 hours of treatment.  Systolic blood pressure, which has been in the 150s to 170s, dropped recently to around 120.  Around that time, patient developed sharp chest pain suddenly.  She said this also occurred once earlier today prior to her presentation here.  Other than that, she has not had similar pain before.  Does not feel like pressure.  She does not report any dyspnea or coughing.  Pushing on the chest wall makes the pain slightly worse.  Deep breathing does not make the pain worse.  On exam, she appears mildly uncomfortable.  Mildly tachypneic, lungs quiet but clear anteriorly.  I personally reviewed EKG which was done while I was at bedside--some T wave inversions seen especially in the lateral leads, but no ST elevations evident.  Troponin drawn.  ER staff indicated that patient could be transported back to the ER for further care--patient transported down for further workup/monitoring and possible admission.

## 2024-07-09 NOTE — ED Notes (Signed)
 Received signout on patient.  Dialysis complete.  Did have brief episode of chest pain during dialysis.  Troponin drawn which was elevated likely secondary to ESRD.  2-hour repeat without significant increase.  Will discharge.    Fairy DELENA Baar, MD       Baar Fairy, MD  07/09/24 [phone]

## 2024-07-09 NOTE — Other (Signed)
 Hep B verification performed for (RN): Andrea Maas, RN  Hep B results, dates, and Primary Source: 06/27/2024 negative, 06/28/2024 susceptible, Epic  Hepatitis B Surface Ag   Date/Time Value Ref Range Status   06/27/2024 01:36 PM NONREACTIVE NR   Final     Hep B S Ag Interp   Date/Time Value Ref Range Status   03/16/2023 08:19 AM Negative NEG   Final     Hep B S Ab   Date/Time Value Ref Range Status   06/28/2024 06:07 AM <3.50 mIU/mL Final     Comment:        <10.00 mIU/mL     Non-Immune  =>10.00 mIU/mL    Individual is considered to be immune to infection with HBV       Machine disinfect process:heat

## 2024-07-09 NOTE — Progress Notes (Signed)
 Rapid Response called at 1439    Responded to rapid response at 1439 for chest pain    Provider at bedside: yes, Monroe, MD  Interventions ordered: EKG, troponin  Sepsis suspected: no  Transfer to higher level of care: no    Called for chest pain. On arrival, pt c/o 10/10 sudden onset of sharp chest pain while receiving dialysis. Of note, dialysis had pulled 2.5L off during this session. VS as follows: BP 115/70 (82), HR 89. EKG obtained reflecting NSR. Monroe, MD at bedside, ordered STAT troponin and t/f back to ED for further evaluation. Dialysis RN obtained troponin. RRT RN reviewed plan of care with dialysis RN, no further RRT intervention needed at this time.    Rapid ended at 1450    RRT RN assisted with transport to accepting unit: n/a

## 2024-07-09 NOTE — ED Triage Notes (Signed)
 Pt arrives to ED via POV with c/o vomiting  and back pain that began yesterday    Pt reports she need dialysis. States she could not go to her last 2 appointments because she has no car. Schedule Tues/Thurs/Sat.

## 2024-07-09 NOTE — Progress Notes (Signed)
 NEPHROLOGY PROGRESS NOTE     Patient: Melinda [name] MRN: [id number]  PCP: No primary care provider on file.   DOB:     Oct 20, 1980  Age:   43 y.o.  Sex:  female          Assessment & Plan:     ESRD no outpatient HD set up  Hypertension  Hyperkalemia  Secondary hyperparathyroidism    Plan:  Plan for urgent HD today via left AVG  Okay to DC after HD treatment  Discussed with patient and acute [facility] team         Subjective:   CC: ESRD patient on HD  HPI: Patient is known to us , she is new ESRD on HD for few months, she did not have outpatient HD set up.  She is here to receive hemodialysis treatment.  Chemistry shows potassium of 6.5.  Patient is complaining of lower back pain.  Vascular access: Left upper extremity AVG  ROS:  Current Facility-Administered Medications   Medication Dose Route Frequency    ondansetron  (ZOFRAN ) injection 8 mg  8 mg IntraVENous Q6H PRN     Current Outpatient Medications   Medication Sig    lidocaine  4 % external patch Place 1 patch onto the skin daily    sevelamer  (RENVELA ) 0.8 g PACK packet Take 1 packet by mouth 3 times daily (with meals)    atorvastatin  (LIPITOR ) 80 MG tablet Take 1 tablet by mouth nightly    carvedilol  (COREG ) 12.5 MG tablet Take 1 tablet by mouth 2 times daily (with meals)    famotidine  (PEPCID ) 20 MG tablet Take 1 tablet by mouth 2 times daily    hydrALAZINE  (APRESOLINE ) 50 MG tablet Take 1 tablet by mouth 3 times daily    sodium bicarbonate  650 MG tablet Take 1 tablet by mouth 3 times daily    metoclopramide  (REGLAN ) 5 MG tablet Take 1 tablet by mouth 3 times daily (before meals)    pantoprazole  (PROTONIX ) 40 MG tablet Take 1 tablet by mouth 2 times daily (before meals)    sennosides-docusate sodium  (SENOKOT-S) 8.6-50 MG tablet Take 1 tablet by mouth in the morning and at bedtime    ondansetron  (ZOFRAN -ODT) 4 MG disintegrating tablet Take 1 tablet by mouth every 8 hours as needed for Nausea or Vomiting    prochlorperazine  (COMPAZINE ) 10 MG tablet Take 1  tablet by mouth every 6 hours as needed (nausea or vomiting)    metFORMIN  (GLUCOPHAGE -XR) 500 MG extended release tablet Take 2 tablets by mouth daily (with breakfast) (Patient not taking: Reported on 06/26/2024)    busPIRone  (BUSPAR ) 15 MG tablet Take 15 mg by mouth 3 times daily (Patient taking differently: Take 5 mg by mouth in the morning and at bedtime)    DULoxetine  (CYMBALTA ) 60 MG extended release capsule Take 1 capsule by mouth daily    clopidogrel  (PLAVIX ) 75 MG tablet Take 1 tablet by mouth daily    aspirin  81 MG chewable tablet Take 1 tablet by mouth daily    amLODIPine  (NORVASC ) 10 MG tablet Take 1 tablet by mouth daily          Objective:     Vitals:  Blood pressure (!) 184/88, pulse (!) 101, temperature 98.2 F (36.8 C), temperature source Oral, resp. rate 18, height 1.499 m (4' 11), weight 55.5 kg (122 lb 5.7 oz), SpO2 97%.  Temp (24hrs), Avg:98.2 F (36.8 C), Min:98.2 F (36.8 C), Max:98.2 F (36.8 C)      Intake and Output:  No intake/output data recorded.  No intake/output data recorded.    Physical Exam:                   Physical Examination:   GENERAL ASSESSMENT: NAD  HEENT:Nontraumatic   CHEST: CTA  HEART: S1S2  ABDOMEN: Soft,NT,  EXTREMITY: No EDEMA  NEURO:Grossly non focal  Left upper extremity AVG          ECG/rhythm:    Data Review      No results for input(s): TNIPOC in the last 72 hours.    Invalid input(s): ITNL   No results found for: LABPROT, LABALBU   Recent Labs     07/09/24  0832   NA 133*   K 6.3*   CL 96*   CO2 19*   BUN 65*   PHOS 7.9*   WBC 10.7   HGB 12.1   HCT 37.2   PLT 413*      Lab Results   Component Value Date    WBC 10.7 07/09/2024    HGB 12.1 07/09/2024    HCT 37.2 07/09/2024    MCV 93.0 07/09/2024    PLT 413 (H) 07/09/2024    LYMPHOPCT 12.8 07/09/2024    RBC 4.00 07/09/2024    MCH 30.3 07/09/2024    MCHC 32.5 07/09/2024    RDW 14.8 (H) 07/09/2024     Lab Results   Component Value Date    CREATININE 6.30 (H) 07/09/2024    CREATININE 4.68 (H) 07/05/2024     CREATININE 5.16 (H) 07/02/2024      Lab Results   Component Value Date    BUN 65 (H) 07/09/2024                    Discussed with:  Patient    Melinda Robey [name], MD  07/09/2024      [facility]:  [email or web address]  [email or web address]    [place] office:  [address]  [place], [place] [address]  Phone: [phone]  Fax :     [phone]    [facility] office:  [address]  [place], [place]  [address]  Phone - [phone]  Fax - [phone]

## 2024-07-09 NOTE — ED Notes (Signed)
 Verbal report given to dialysis RN Mel at this time, pt to go to dialysis

## 2024-07-09 NOTE — Consults (Signed)
 Session ID: [id number]  Session Duration: 2 minutes  Language: Spanish  Interpreter ID: #[id number]  Interpreter Name: Dorn Maria

## 2024-07-09 NOTE — ED Provider Notes (Signed)
 [facility] EMERGENCY DEPARTMENT  EMERGENCY DEPARTMENT ENCOUNTER      Pt Name: Melinda [name]  MRN: [id number]  Birthdate 02/14/1981  Date of evaluation: 07/09/2024  Provider: Elsie Dolly, MD    CHIEF COMPLAINT       Chief Complaint   Patient presents with    Vomiting         HISTORY OF PRESENT ILLNESS   (Location/Symptom, Timing/Onset, Context/Setting, Quality, Duration, Modifying Factors, Severity)  Note limiting factors.   42 year old female with a past medical history significant for type 2 diabetes, gastroparesis, GERD, hypercholesterolemia, hypertension, CKD and hemodialysis dependent who presents the ER for evaluation for nausea and vomiting with abdominal pain and general malaise.  The patient also has not had dialysis for the last 3 days as she skipped her dialysis last week.  She denies any fever and chill, sore throat, cough or congestion, chest pain, shortness of breath, diarrhea, constipation, dysuria, dizziness, extremity weakness or numbness, sick contacts or recent travel, prior history of the same.            Review of External Medical Records:     Nursing Notes were reviewed.    REVIEW OF SYSTEMS    (2-9 systems for level 4, 10 or more for level 5)     Review of Systems   All other systems reviewed and are negative.      Except as noted above the remainder of the review of systems was reviewed and negative.       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)     Gastroparesis     Gastroparesis     GERD (gastroesophageal reflux disease)     High cholesterol     Hypertension          SURGICAL HISTORY       Past Surgical History:   Procedure Laterality Date    CAPSULE ENDOSCOPY N/A 01/20/2023    ESOPHAGEAL CAPSULE ENDOSCOPY remove at 1624PM performed by Madlyn Fendt, MD at [facility] ENDOSCOPY    CHOLECYSTECTOMY, LAPAROSCOPIC N/A 02/03/2023    ROBOTIC LAPAROSCOPIC CHOLECYSTECTOMY with  Indocyanine green  performed by Chrystal Elijah BRAVO, MD at [facility] MAIN OR    COLONOSCOPY N/A 01/19/2023    COLONOSCOPY DIAGNOSTIC performed by Madlyn Fendt, MD at [facility] ENDOSCOPY    INVASIVE VASCULAR N/A 10/03/2023    Angiography visceral SMA performed by Millard Oneil LABOR, MD at [facility] CARDIAC CATH LAB    INVASIVE VASCULAR N/A 10/03/2023    Ultrasound guided vascular access performed by Millard Oneil LABOR, MD at [facility] CARDIAC CATH LAB    INVASIVE VASCULAR N/A 10/03/2023    Insert stent peripheral artery performed by Millard Oneil LABOR, MD at [facility] CARDIAC CATH LAB    IR NONTUNNELED VASCULAR CATHETER > 5 YEARS  10/10/2023    IR NONTUNNELED VASCULAR CATHETER > 5 YEARS 10/10/2023 [facility] CARDIAC CATH/EP/IR LAB    IR NONTUNNELED VASCULAR CATHETER > 5 YEARS  10/10/2023    IR NONTUNNELED VASCULAR CATHETER > 5 YEARS 10/10/2023 [facility] CARDIAC CATH/EP/IR LAB    OTHER SURGICAL HISTORY Left     Rentia attachment    TUBAL LIGATION Bilateral     UPPER GASTROINTESTINAL ENDOSCOPY N/A 01/17/2023    ESOPHAGOGASTRODUODENOSCOPY performed by Madlyn Fendt, MD at [facility] ENDOSCOPY    UPPER GASTROINTESTINAL ENDOSCOPY N/A 01/17/2023    ESOPHAGOGASTRODUODENOSCOPY BIOPSY  performed by Madlyn Fendt, MD at [facility] ENDOSCOPY    UPPER GASTROINTESTINAL ENDOSCOPY N/A 01/18/2023    ESOPHAGOGASTRODUODENOSCOPY performed by Madlyn Fendt, MD at [facility] ENDOSCOPY    UPPER GASTROINTESTINAL ENDOSCOPY N/A 05/13/2023    ESOPHAGOGASTRODUODENOSCOPY performed by Roseann Lonni BIRCH, MD at [facility] ENDOSCOPY    UPPER GASTROINTESTINAL ENDOSCOPY N/A 05/13/2023    ESOPHAGOGASTRODUODENOSCOPY BIOPSY performed by Roseann Lonni BIRCH, MD at [facility] ENDOSCOPY    UPPER GASTROINTESTINAL ENDOSCOPY N/A 09/21/2023    ESOPHAGOGASTRODUODENOSCOPY performed by Madlyn Fendt, MD at [facility] ENDOSCOPY    UPPER GASTROINTESTINAL ENDOSCOPY N/A 09/21/2023    ESOPHAGOGASTRODUODENOSCOPY BIOPSY performed by Madlyn Fendt, MD at [facility] ENDOSCOPY    US  ABSCESS DRAINAGE PERITONEAL/RETROPERITONEAL [facility]  02/11/2023    US  ABSCESS DRAINAGE PERITONEAL  02/11/2023 [facility] RAD US          CURRENT MEDICATIONS       Previous Medications    AMLODIPINE  (NORVASC ) 10 MG TABLET    Take 1 tablet by mouth daily    ASPIRIN  81 MG CHEWABLE TABLET    Take 1 tablet by mouth daily    ATORVASTATIN  (LIPITOR ) 80 MG TABLET    Take 1 tablet by mouth nightly    BUSPIRONE  (BUSPAR ) 15 MG TABLET    Take 15 mg by mouth 3 times daily    CARVEDILOL  (COREG ) 12.5 MG TABLET    Take 1 tablet by mouth 2 times daily (with meals)    CLOPIDOGREL  (PLAVIX ) 75 MG TABLET    Take 1 tablet by mouth daily    DULOXETINE  (CYMBALTA ) 60 MG EXTENDED RELEASE CAPSULE    Take 1 capsule by mouth daily    FAMOTIDINE  (PEPCID ) 20 MG TABLET    Take 1 tablet by mouth 2 times daily    HYDRALAZINE  (APRESOLINE ) 50 MG TABLET    Take 1 tablet by mouth 3 times daily    LIDOCAINE  4 % EXTERNAL PATCH    Place 1 patch onto the skin daily    METFORMIN  (GLUCOPHAGE -XR) 500 MG EXTENDED RELEASE TABLET    Take 2 tablets by mouth daily (with breakfast)    METOCLOPRAMIDE  (REGLAN ) 5 MG TABLET    Take 1 tablet by mouth 3 times daily (before meals)    ONDANSETRON  (ZOFRAN -ODT) 4 MG DISINTEGRATING TABLET    Take 1 tablet by mouth every 8 hours as needed for Nausea or Vomiting    PANTOPRAZOLE  (PROTONIX ) 40 MG TABLET    Take 1 tablet by mouth 2 times daily (before meals)    PROCHLORPERAZINE  (COMPAZINE ) 10 MG TABLET    Take 1 tablet by mouth every 6 hours as needed (nausea or vomiting)    SENNOSIDES-DOCUSATE SODIUM  (SENOKOT-S) 8.6-50 MG TABLET    Take 1 tablet by mouth in the morning and at bedtime    SEVELAMER  (RENVELA ) 0.8 G PACK PACKET    Take 1 packet by mouth 3 times daily (with meals)    SODIUM BICARBONATE  650 MG TABLET    Take 1 tablet by mouth 3 times daily       ALLERGIES     Patient has no known allergies.    FAMILY HISTORY     No family history on file.       SOCIAL HISTORY       Social History     Socioeconomic History    Marital status: Married   Tobacco Use    Smoking status: Never    Smokeless tobacco: Never   Vaping Use    Vaping status:  Never Used  Substance and Sexual Activity    Alcohol use: Never    Drug use: Never    Sexual activity: Defer     Social Drivers of Health     Financial Resource Strain: Low Risk  (05/02/2024)    Received from [facility]    Overall Financial Resource Strain (CARDIA)     Difficulty of Paying Living Expenses: Not hard at all   Recent Concern: Financial Resource Strain - High Risk (04/02/2024)    Received from [facility]    Overall Financial Resource Strain (CARDIA)     Difficulty of Paying Living Expenses: Hard   Food Insecurity: No Food Insecurity (06/26/2024)    Hunger Vital Sign     Worried About Running Out of Food in the Last Year: Never true     Ran Out of Food in the Last Year: Never true   Recent Concern: Food Insecurity - Food Insecurity Present (04/02/2024)    Received from [facility]    Hunger Vital Sign     Within the past 12 months, you worried that your food would run out before you got the money to buy more.: Often true     Within the past 12 months, the food you bought just didn't last and you didn't have money to get more.: Often true   Transportation Needs: No Transportation Needs (06/26/2024)    PRAPARE - Therapist, art (Medical): No     Lack of Transportation (Non-Medical): No   Recent Concern: Transportation Needs - Unmet Transportation Needs (05/02/2024)    Received from [facility] - Transportation     In the past 12 months, has lack of transportation kept you from medical appointments or from getting medications?: No     Lack of Transportation (Non-Medical): Yes   Housing Stability: Low Risk  (06/26/2024)    Housing Stability Vital Sign     Unable to Pay for Housing in the Last Year: No     Number of Times Moved in the Last Year: 0     Homeless in the Last Year: No           PHYSICAL EXAM    (up to 7 for level 4, 8 or more for level 5)     ED Triage Vitals [07/09/24 0824]   BP Girls  Systolic BP Percentile Girls Diastolic BP Percentile Boys Systolic BP Percentile Boys Diastolic BP Percentile Temp Temp Source Pulse   (!) 184/88 -- -- -- -- 98.2 F (36.8 C) Oral (!) 101      Respirations SpO2 Height Weight - Scale       18 97 % 1.499 m (4' 11) 55.5 kg (122 lb 5.7 oz)           Body mass index is 24.71 kg/m.    Physical Exam  Vitals and nursing note reviewed. Exam conducted with a chaperone present.         CONSTITUTIONAL: Well-appearing; well-nourished; in no apparent distress  HEAD: Normocephalic; atraumatic  EYES: PERRL; EOM intact; conjunctiva and sclera are clear bilaterally.  ENT: No rhinorrhea; normal pharynx with no tonsillar hypertrophy; mucous membranes pink/moist, no erythema, no exudate.  NECK: Supple; non-tender; no cervical lymphadenopathy  CARD: Normal S1, S2; no murmurs, rubs, or gallops. Regular rate and rhythm.  RESP: Normal respiratory effort; breath sounds clear and equal bilaterally; no wheezes, rhonchi, or rales.  ABD: Normal bowel  sounds; non-distended; non-tender; no palpable organomegaly, no masses, no bruits.  Back Exam: Normal inspection; no vertebral point tenderness, no CVA tenderness. Normal range of motion.  EXT: Normal ROM in all four extremities; non-tender to palpation; no swelling or deformity; distal pulses are normal, no edema.  SKIN: Warm; dry; no rash.  NEURO:Alert and oriented x 3, coherent, NII-XII grossly intact, sensory and motor are non-focal.  '    DIAGNOSTIC RESULTS     EKG: All EKG's are interpreted by the Emergency Department Physician who either signs or Co-signs this chart in the absence of a cardiologist.        RADIOLOGY:   Non-plain film images such as CT, Ultrasound and MRI are read by the radiologist. Plain radiographic images are visualized and preliminarily interpreted by the emergency physician with the below findings:        Interpretation per the Radiologist below, if available at the time of this note:    No orders to display         LABS:  Labs Reviewed   CBC WITH AUTO DIFFERENTIAL - Abnormal; Notable for the following components:       Result Value    RDW 14.8 (*)     Platelets 413 (*)     Eosinophils % 13.8 (*)     Basophils % 1.1 (*)     Eosinophils Absolute 1.48 (*)     Basophils Absolute 0.12 (*)     All other components within normal limits   RENAL FUNCTION PANEL - Abnormal; Notable for the following components:    Sodium 133 (*)     Potassium 6.3 (*)     Chloride 96 (*)     CO2 19 (*)     Anion Gap 18 (*)     Glucose 270 (*)     BUN 65 (*)     Creatinine 6.30 (*)     BUN/Creatinine Ratio 10 (*)     Est, Glom Filt Rate 8 (*)     Phosphorus 7.9 (*)     Albumin  3.3 (*)     All other components within normal limits   EXTRA TUBES HOLD   COMPREHENSIVE METABOLIC PANEL   LIPASE   MAGNESIUM    PHOSPHORUS   C-REACTIVE PROTEIN   CBC WITH AUTO DIFFERENTIAL       All other labs were within normal range or not returned as of this dictation.    EMERGENCY DEPARTMENT COURSE and DIFFERENTIAL DIAGNOSIS/MDM:   Vitals:    Vitals:    07/09/24 1145 07/09/24 1200 07/09/24 1215 07/09/24 1230   BP: (!) 197/109 (!) 174/99 (!) 162/86 (!) (P) 159/90   Pulse: 98 92 85 (P) 82   Resp:       Temp:       TempSrc:       SpO2:       Weight:       Height:               Medical Decision Making  Assessment: 43 year old female with chronic kidney insufficiency dialysis dependent limits urinalysis today with nausea and vomiting and diaphoresis.  The patient does have chronic pain and multiple ER visits for this.  She appears stable.  She will need evaluation for metabolic derangement.  As well as evaluation by nephrology for dialysis.    Plan: Lab/IV fluid/antiemetic and analgesia/consult nephrology to arrange for dialysis/ Monitor and Reevaluate.'      Amount and/or Complexity of Data Reviewed  Labs:  ordered.    Risk  Prescription drug management.            REASSESSMENT        CONSULT NOTE:  Joey Elsie SAILOR, MD spoke with Dr. Foy of nephrology.  Discussed patient's  presentation, history,hysical assessment, and available diagnostic results. Will come and see the patient in the ED and arrange for dialysis.    12:48 PM  Change of shift.  Care of patient signed over to [facility].  Bedside handoff complete. Awaiting with dialysis and evaluation afterwards and discharge home..      CONSULTS:  IP CONSULT TO NEPHROLOGY    PROCEDURES:  Unless otherwise noted below, none     Procedures      FINAL IMPRESSION      1. Gastroparesis    2. CKD (chronic kidney disease) requiring chronic dialysis (HCC)    3. Hyperkalemia          DISPOSITION/PLAN   DISPOSITION Discharge - Pending Orders Complete 07/09/2024 12:45:43 PM      PATIENT REFERRED TO:  No follow-up provider specified.    DISCHARGE MEDICATIONS:  New Prescriptions    No medications on file         (Please note that portions of this note were completed with a voice recognition program.  Efforts were made to edit the dictations but occasionally words are mis-transcribed.)    Elsie Joey, MD (electronically signed)  Emergency Attending Physician / Physician Assistant / Nurse Practitioner            Joey Elsie SAILOR, MD  07/11/24 [phone]

## 2024-07-10 LAB — EKG 12-LEAD
Atrial Rate: 98 {beats}/min
Diagnosis: NORMAL
P Axis: 54 degrees
P-R Interval: 146 ms
Q-T Interval: 418 ms
QRS Duration: 86 ms
QTc Calculation (Bazett): 533 ms
R Axis: 244 degrees
T Axis: 106 degrees
Ventricular Rate: 98 {beats}/min

## 2024-07-11 ENCOUNTER — Inpatient Hospital Stay
Admit: 2024-07-11 | Discharge: 2024-07-11 | Disposition: A | Discharge status: Home / Self Care | Payer: PRIVATE HEALTH INSURANCE | Arrived: VH

## 2024-07-11 ENCOUNTER — Emergency Department: Admit: 2024-07-11 | Payer: PRIVATE HEALTH INSURANCE

## 2024-07-11 DIAGNOSIS — N186 End stage renal disease: Principal | ICD-10-CM

## 2024-07-11 LAB — CBC WITH AUTO DIFFERENTIAL
Basophils %: 1.2 % — ABNORMAL HIGH (ref 0.0–1.0)
Basophils Absolute: 0.11 K/UL — ABNORMAL HIGH (ref 0.00–0.10)
Eosinophils %: 14.5 % — ABNORMAL HIGH (ref 0.0–7.0)
Eosinophils Absolute: 1.32 K/UL — ABNORMAL HIGH (ref 0.00–0.40)
Hematocrit: 36 % (ref 35.0–47.0)
Hemoglobin: 11.4 g/dL — ABNORMAL LOW (ref 11.5–16.0)
Immature Granulocytes %: 0.3 % (ref 0.0–0.5)
Immature Granulocytes Absolute: 0.03 K/UL (ref 0.00–0.04)
Lymphocytes %: 16.5 % (ref 12.0–49.0)
Lymphocytes Absolute: 1.5 K/UL (ref 0.80–3.50)
MCH: 29.5 pg (ref 26.0–34.0)
MCHC: 31.7 g/dL (ref 30.0–36.5)
MCV: 93.3 FL (ref 80.0–99.0)
MPV: 9.5 FL (ref 8.9–12.9)
Monocytes %: 7.4 % (ref 5.0–13.0)
Monocytes Absolute: 0.67 K/UL (ref 0.00–1.00)
Neutrophils %: 60.1 % (ref 32.0–75.0)
Neutrophils Absolute: 5.47 K/UL (ref 1.80–8.00)
Nucleated RBCs: 0 /100{WBCs}
Platelets: 368 K/uL (ref 150–400)
RBC: 3.86 M/uL (ref 3.80–5.20)
RDW: 14.7 % — ABNORMAL HIGH (ref 11.5–14.5)
WBC: 9.1 K/uL (ref 3.6–11.0)
nRBC: 0 K/uL (ref 0.00–0.01)

## 2024-07-11 LAB — URINALYSIS WITH MICROSCOPIC
Bilirubin, Urine: NEGATIVE
Glucose, Ur: 500 mg/dL — AB
Ketones, Urine: NEGATIVE mg/dL
Leukocyte Esterase, Urine: NEGATIVE
Nitrite, Urine: NEGATIVE
Protein, UA: >300 mg/dL — AB
Specific Gravity, UA: 1.019 (ref 1.003–1.030)
Urobilinogen, Urine: 0.2 EU/dL (ref 0.2–1.0)
pH, Urine: 7.5 (ref 5.0–8.0)

## 2024-07-11 LAB — EXTRA TUBES HOLD

## 2024-07-11 LAB — COMPREHENSIVE METABOLIC PANEL
ALT: 73 U/L — ABNORMAL HIGH (ref 10–35)
AST: 45 U/L — ABNORMAL HIGH (ref 10–35)
Albumin/Globulin Ratio: 0.7 — ABNORMAL LOW (ref 1.1–2.2)
Albumin: 3 g/dL — ABNORMAL LOW (ref 3.5–5.2)
Alk Phosphatase: 482 U/L — ABNORMAL HIGH (ref 35–104)
Anion Gap: 16 mmol/L — ABNORMAL HIGH (ref 2–14)
BUN/Creatinine Ratio: 9 — ABNORMAL LOW (ref 12–20)
BUN: 53 mg/dL — ABNORMAL HIGH (ref 6–20)
CO2: 23 mmol/L (ref 20–29)
Calcium: 8.3 mg/dL — ABNORMAL LOW (ref 8.6–10.0)
Chloride: 95 mmol/L — ABNORMAL LOW (ref 98–107)
Creatinine: 5.66 mg/dL — ABNORMAL HIGH (ref 0.60–1.00)
Est, Glom Filt Rate: 9 ml/min/1.73m2 — ABNORMAL LOW (ref >59)
Globulin: 4 g/dL (ref 2.0–4.0)
Glucose: 317 mg/dL — ABNORMAL HIGH (ref 65–100)
Potassium: 5.7 mmol/L — ABNORMAL HIGH (ref 3.5–5.1)
Sodium: 134 mmol/L — ABNORMAL LOW (ref 136–145)
Total Bilirubin: 0.3 mg/dL (ref 0.0–1.2)
Total Protein: 6.9 g/dL (ref 6.4–8.3)

## 2024-07-11 LAB — URINE CULTURE HOLD SAMPLE

## 2024-07-11 LAB — MAGNESIUM: Magnesium: 2.1 mg/dL (ref 1.6–2.6)

## 2024-07-11 MED ORDER — MORPHINE SULFATE (PF) 4 MG/ML IJ SOLN
4 | INTRAMUSCULAR | Status: AC
Start: 2024-07-11 — End: 2024-07-11
  Administered 2024-07-11: 16:00:00 4 mg via INTRAVENOUS

## 2024-07-11 MED ORDER — ONDANSETRON HCL 4 MG/2ML IJ SOLN
4 | Freq: Once | INTRAMUSCULAR | Status: AC
Start: 2024-07-11 — End: 2024-07-11
  Administered 2024-07-11: 15:00:00 4 mg via INTRAVENOUS

## 2024-07-11 MED ORDER — SODIUM CHLORIDE (PF) 0.9 % IJ SOLN
0.9 | Freq: Once | INTRAMUSCULAR | Status: AC
Start: 2024-07-11 — End: 2024-07-11
  Administered 2024-07-11: 16:00:00 40 mg via INTRAVENOUS

## 2024-07-11 MED ORDER — ACETAMINOPHEN 500 MG PO TABS
500 | Freq: Once | ORAL | Status: DC
Start: 2024-07-11 — End: 2024-07-11

## 2024-07-11 MED ORDER — ONDANSETRON HCL 4 MG/2ML IJ SOLN
4 | Freq: Once | INTRAMUSCULAR | Status: AC
Start: 2024-07-11 — End: 2024-07-11
  Administered 2024-07-11: 23:00:00 4 mg via INTRAVENOUS

## 2024-07-11 MED FILL — ACETAMINOPHEN EXTRA STRENGTH 500 MG PO TABS: 500 mg | ORAL | Qty: 2 | Fill #0

## 2024-07-11 MED FILL — PANTOPRAZOLE SODIUM 40 MG IV SOLR: 40 mg | INTRAVENOUS | Qty: 40 | Fill #0

## 2024-07-11 MED FILL — MORPHINE SULFATE 4 MG/ML IJ SOLN: 4 mg/mL | INTRAMUSCULAR | Qty: 1 | Fill #0

## 2024-07-11 MED FILL — ONDANSETRON HCL 4 MG/2ML IJ SOLN: 4 MG/2ML | INTRAMUSCULAR | Qty: 2 | Fill #0

## 2024-07-11 NOTE — ED Notes (Signed)
 Vascular access team member at bedside attempting to obtain line and labs. Provider and ER Charge RN notified

## 2024-07-11 NOTE — Care Coordination-Inpatient (Addendum)
 Updates at 1500:    OP HD has been confirmed/accepted.     [facility]  Mondays, Wednesdays, Fridays  Chair time:  1330 with the 1300 arrival time.  First visit:  07/13/2024 at 1300 (on AVS).    Nephrologist:  Dr. Gadde      Updates at 1415:    Discharge Plan:  CM contacted [facility] [phone]) [phone] & spoke to [facility], Dialysis Coordinator confirming the following chair:    [facility]  Mondays, Wednesdays, Fridays  Chair time:  1330  Nephrologist:  Dr. Gadde    Will wait until final confirmation from [facility] is made.  Will provide patient/dtr with chair time information.            Updates at 1350:    CM called [facility] and spoke to [place], Dialysis Coordinator [phone] ext:  [phone], confirming that they are still waiting for [facility] to confirm OP HD chair.  H&P from 9/25 and Current Nephro note from today has been uploaded to [facility]port per request.      Will continue to follow.           Case Management ED Note:    CM Consult:  Outpatient HD chair.  [facility] is currently reviewing clinicals and confirming patient's insurance is active.        CM met with patient & patient's daughter, Jonette Saupe [phone] at bedside, to discuss updates.    (1)  Last HD was Monday, 07/09/2024.    (2)  Insurance: [employer].  CM confirmed with patient & her dtr that this insurance is effective today, 07/11/2024.    (3)  CM called and spoke to Ronal Sharps, [facility] Coordinator [phone] and discussed the case with her.  She is sending patient's info, including insurance to [facility] for review.      (4)  Referral sent to [facility] via [facility]port for review.     (5) Patient is getting HD today prior to discharge.        Discussed with the medical treatment team.    Will continue to follow.    ______________________________________  Creighton Mulling, BSN, RN-CM  Shelvy Gwenn Thresa Bernardino- Care Management  Available via [employer]  07/11/2024  11:57 AM

## 2024-07-11 NOTE — Consults (Signed)
 Session ID: [id number]  Session Duration: 10 minutes  Language: Spanish  Interpreter ID: #[id number]  Interpreter Name: Sallyann

## 2024-07-11 NOTE — ED Provider Notes (Shared)
 ED SIGN OUT NOTE  Care assumed at [facility] 3:37 PM EDT    Patient was signed out to me by Dr. Perri.     Patient signed out pending disposition      BP (!) 154/82   Pulse 89   Temp 97.3 F (36.3 C)   Resp 18   Ht 1.499 m (4' 11)   Wt 54.6 kg (120 lb 5.9 oz)   SpO2 100%   BMI 24.31 kg/m   Labs Reviewed   CBC WITH AUTO DIFFERENTIAL - Abnormal; Notable for the following components:       Result Value    Hemoglobin 11.4 (*)     RDW 14.7 (*)     Eosinophils % 14.5 (*)     Basophils % 1.2 (*)     Eosinophils Absolute 1.32 (*)     Basophils Absolute 0.11 (*)     All other components within normal limits   COMPREHENSIVE METABOLIC PANEL - Abnormal; Notable for the following components:    Sodium 134 (*)     Potassium 5.7 (*)     Chloride 95 (*)     Anion Gap 16 (*)     Glucose 317 (*)     BUN 53 (*)     Creatinine 5.66 (*)     BUN/Creatinine Ratio 9 (*)     Est, Glom Filt Rate 9 (*)     Calcium  8.3 (*)     ALT 73 (*)     AST 45 (*)     Alk Phosphatase 482 (*)     Albumin  3.0 (*)     Albumin /Globulin Ratio 0.7 (*)     All other components within normal limits   URINALYSIS WITH MICROSCOPIC - Abnormal; Notable for the following components:    Protein, UA >300 (*)     Glucose, Ur 500 (*)     Blood, Urine TRACE (*)     BACTERIA, URINE 3+ (*)     All other components within normal limits   URINE CULTURE HOLD SAMPLE   EXTRA TUBES HOLD   MAGNESIUM    HEPATITIS B CORE AB IGG/IGM     XR CHEST PORTABLE   Final Result   1. Stable cardiomegaly   2. Lungs are clear. No pulm edema         Electronically signed by Evalene JINNY Hoit           Diagnosis:   1. Encounter for dialysis      Disposition:            Plan:     Patient presents needing dialysis, was between insurance providers, and is now set up with dialysis clinic and insurance but needs dialysis today. Awaiting run of dialysis with expectant discharge.     Patient re-evaluated. ***    Diagnosis is ***. Disposition is {dispo:63338}. Workup and plan  discussed with {dispodiscuss:63339}, {dispo choices:63340::all questions answered,they are in agreement with the  plan }.       Bernardino Dowell, DO

## 2024-07-11 NOTE — Plan of Care (Signed)
 Hep B verification performed for (RN): Mahlon BECKER RN  Hep B results, dates, and Primary Source: Negative-06/27/24 Susceptible-06/28/24. Epic  Hepatitis B Surface Ag   Date/Time Value Ref Range Status   06/27/2024 01:36 PM NONREACTIVE NR   Final     Hep B S Ag Interp   Date/Time Value Ref Range Status   03/16/2023 08:19 AM Negative NEG   Final     Hep B S Ab   Date/Time Value Ref Range Status   06/28/2024 06:07 AM <3.50 mIU/mL Final     Comment:        <10.00 mIU/mL     Non-Immune  =>10.00 mIU/mL    Individual is considered to be immune to infection with HBV       Machine disinfect process:Heat

## 2024-07-11 NOTE — Other (Signed)
 Primary RN SBAR: Susie Norris, RN  Incapacitated Nurse edu. provided: yes  Patient Education provided: avf access infection prevention  Preferred Education method and Primary language: yes  Dialysis consent: yes  Hospital General Consent Verified: yes  Hospital associated wait time; reason: 0  Hepatitis B Surface Ag   Date/Time Value Ref Range Status   06/27/2024 01:36 PM NONREACTIVE NR   Final     Hep B S Ag Interp   Date/Time Value Ref Range Status   03/16/2023 08:19 AM Negative NEG   Final     Hep B S Ab   Date/Time Value Ref Range Status   06/28/2024 06:07 AM <3.50 mIU/mL Final     Comment:        <10.00 mIU/mL     Non-Immune  =>10.00 mIU/mL    Individual is considered to be immune to infection with HBV       Hep B results, dates, and Primary Source: negative 06/27/2024; susceptible 06/28/2024; EPIC.  Hep B dual verification performed by (RN): Zara Nicks, RN   Machine disinfect process:ACID/HEAT        Each access site disinfected for 60 seconds per site with alcohol swabs per P&P. Cannulated with 15G needles x2 and secured with paper tape.   +aspiration/+flushed.     Patient's consent, code status, medications checked.                      Safety checks complete, time out performed.   assessed, +bruit/thrill, no redness, warmth or drainage noted. Skin prepped per procedure using chloraprep scrub x 30 sec, followed by 30 sec dry time each site. Cannulated using 15g needles each site and secured with tape. +flash/aspiration/flush. HD initiated. Access visible, lines secure. Medications reviewed.         PRE-TREATMENT 07/11/24 1445   Observations & Evaluations   Level of Consciousness 0   Oriented X x4   Heart Rhythm Regular   Respiratory Quality/Effort Unlabored   O2 Device None (Room air)   Bilateral Breath Sounds Diminished   Skin Color Other (comment)  (normal for ethnicity)   Skin Condition/Temp Dry;Warm   Appetite Good   Abdomen Inspection Soft   Bowel Sounds (All Quadrants) Present   Last BM  (including prior to admit) 07/11/24   Edema None   Vital Signs   BP (!) 156/84   Temp 97.3 F (36.3 C)   Pulse 89   Respirations 18   SpO2 100 %   Pain Assessment   Pain Assessment 0-10   Pain Level 0   Technical Checks   Dialysis Machine No. 1   RO Machine Number 1   Dialyzer Lot No. [id number]   Tubing Lot Number [id number]   All Connections Secure Yes   NS Bag Yes   Saline Line Double Clamped Yes   Dialyzer F-180   Prime Volume (mL) 250 mL   ICEBOAT I;C;E;B;O;A;T   RO Machine Log Sheet Completed Yes   Machine Alarm Self Test Completed;Passed   Child psychotherapist Function;pH Reading   Sport and exercise psychologist Conductivity 13.9   Manual Conductivity 13.9   Machine Ph 7   Manual Ph 7   Bleach Test (Neg) Yes   Bath Temperature 98.6 F (37 C)   Hemodialysis Fistula/Graft Arteriovenous fistula Left Arm   No placement date or time found.   Present on Admission/Arrival: Yes  Access Type: Arteriovenous fistula  Orientation: Left  Access Location:  Arm   Site Assessment Clean, dry & intact   Thrill Present   Bruit Present   Status Accessed   Venous Needle Size 15 G   Arterial Needle Size 15 G   Accessed By: EUGENIA SOL, RN   Access Attempts  1   Access Interventions Chlorhexidine;Aseptic Technique;Needles taped to patient   Dressing Status Clean, dry & intact   Dialysis Bath   K+ (Potassium) 2   Ca+ (Calcium ) 2.5   Na+ (Sodium) 138   HCO3 (Bicarb) 35   Bicarbonate Concentrate Lot No. [id number]   Acid Concentrate Lot No. [id number]   Handoff   Handoff Given To EUGENIA SOL, RN   Handoff Received From Susie Norris, RN   Handoff Communication Face to Face   Time Handoff Given 1440   End of Shift Check Performed N/A       INTRA-DIALYSIS 07/11/24 1515   Treatment   Time On 1515   Treatment Goal 3.5H/3-4L   Vital Signs   BP (!) 154/82   Pulse 89   Respirations 18   SpO2 100 %   Treatment Initiation   Dialyze Hours 3.5   Treatment  Initiation Universal Precautions  maintained;Lines secured to patient;Connections secured;Prime given;Venous Parameters set;Arterial Parameters set;Lobbyist;[place] Device;Saline line double clamped;Hemo-Safe Applied;Dialyzer;F180   During Hemodialysis Assessment   Blood Flow Rate (ml/min) 400 ml/min   Arterial Pressure (mmHg) -180 mmHg   Venous Pressure (mmHg) 150   TMP 60   DFR 600   Comments HD STARTED   Access Visible Yes   Ultrafiltration Rate (ml/hr) 1000 ml/hr   Ultrafiltration Removed (ml) 0 ml       POST TREATMENT 07/11/24 1845   Treatment   Time Off 1845   Observations & Evaluations   Level of Consciousness 1   Oriented X X4   Heart Rhythm Regular   Respiratory Quality/Effort Unlabored   O2 Device None (Room air)   Bilateral Breath Sounds Diminished   Skin Condition/Temp Dry;Warm   Appetite Good   Abdomen Inspection Soft   Bowel Sounds (All Quadrants) Present   Last BM (including prior to admit) 07/11/24   Edema None   Vital Signs   BP (!) 148/91   Temp 98 F (36.7 C)   Pulse (!) 102   Respirations 20   SpO2 100 %   Pain Assessment   Pain Assessment 0-10   Pain Level 10   Patient's Stated Pain Goal 0 - No pain   Pain Location Back   Pain Orientation Other (Comment)  (generalized)   Pain Descriptors Aching   During Hemodialysis Assessment   Blood Flow Rate (ml/min) 400 ml/min   Arterial Pressure (mmHg) -210 mmHg   Venous Pressure (mmHg) 200   TMP 50   DFR 600   Comments HD COMPLETED   Access Visible Yes   Ultrafiltration Rate (ml/hr) 1150 ml/hr   Ultrafiltration Removed (ml) 4000 ml   Hemodialysis Fistula/Graft Arteriovenous fistula Left Arm   No placement date or time found.   Present on Admission/Arrival: Yes  Access Type: Arteriovenous fistula  Orientation: Left  Access Location: Arm   Site Assessment Clean, dry & intact   Thrill Present   Bruit Present   Status Deaccessed   Dressing Status Clean, dry & intact   Post-Hemodialysis Assessment   Post-Treatment Procedures Blood returned;Access bleeding time < 10  minutes   Machine Disinfection Process Acid/Vinegar Clean;Heat Disinfect;Exterior Engineer, agricultural Volume (ml) 250 ml   Blood Volume  Processed (Liters) 79.2 L   Dialyzer Clearance Lightly streaked   Duration of Treatment (minutes) 210 minutes   Heparin  Amount Administered During Treatment (mL) 0 mL   Hemodialysis Intake (ml) 500 ml   Hemodialysis Output (ml) 4000 ml   NET Removed (ml) 3500   Tolerated Treatment Good   Patient Response to Treatment stable   Patient Disposition Remain in ICU/ED  (ED BED 2)   Handoff   Handoff Given To Susie Norris, RN   Handoff Received From EUGENIA SOL, RN   Handoff Communication Face to Face   Time Handoff Given 1850   End of Shift Check Performed N/A         HD treatment complete. All possible blood returned to the patient. De-cannulated and pressure held until hemostasis was achieved. No bleeding noted. Dressing applied. +bruit/thrill.     Comments: Patient tolerated treatment without incident, UF goal achieved. At end of treatment patient is awake and alert, VSS. Needle limbs cleaned, all possible blood rinsed back to patient, limbs flushed with 10cc NS. Needles removed, manual pressure applied over puncture sites with clean dry folded 2x2 for approximately five minutes, hemostasis achieved. Puncture sites covered with clean dry folded 2x2, secured with tape. Bed wheels locked, bed in low position, call light in reach.   Comfort and care rendered, needs attended, questions answered.  Call bell within reach, bed in lowest position.

## 2024-07-11 NOTE — Progress Notes (Signed)
 NEPHROLOGY PROGRESS NOTE     Patient: Melinda [name] MRN: [id number]  PCP: No primary care provider on file.   DOB:     [birth year]  Age:   43 y.o.  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 She is here to receive hemodialysis treatment. She denies cp/sob  Lab show K 5.7    ROS:  No current facility-administered medications for this encounter.     Current Outpatient Medications   Medication Sig    lidocaine  4 % external patch Place 1 patch onto the skin daily    sevelamer  (RENVELA ) 0.8 g PACK packet Take 1 packet by mouth 3 times daily (with meals)    atorvastatin  (LIPITOR ) 80 MG tablet Take 1 tablet by mouth nightly    carvedilol  (COREG ) 12.5 MG tablet Take 1 tablet by mouth 2 times daily (with meals)    famotidine  (PEPCID ) 20 MG tablet Take 1 tablet by mouth 2 times daily    hydrALAZINE  (APRESOLINE ) 50 MG tablet Take 1 tablet by mouth 3 times daily    sodium bicarbonate  650 MG tablet Take 1 tablet by mouth 3 times daily    metoclopramide  (REGLAN ) 5 MG tablet Take 1 tablet by mouth 3 times daily (before meals)    pantoprazole  (PROTONIX ) 40 MG tablet Take 1 tablet by mouth 2 times daily (before meals)    sennosides-docusate sodium  (SENOKOT-S) 8.6-50 MG tablet Take 1 tablet by mouth in the morning and at bedtime    ondansetron  (ZOFRAN -ODT) 4 MG disintegrating tablet Take 1 tablet by mouth every 8 hours as needed for Nausea or Vomiting    prochlorperazine  (COMPAZINE ) 10 MG tablet Take 1 tablet by mouth every 6 hours as needed (nausea or vomiting)    metFORMIN   (GLUCOPHAGE -XR) 500 MG extended release tablet Take 2 tablets by mouth daily (with breakfast) (Patient not taking: Reported on 06/26/2024)    busPIRone  (BUSPAR ) 15 MG tablet Take 15 mg by mouth 3 times daily (Patient taking differently: Take 5 mg by mouth in the morning and at bedtime)    DULoxetine  (CYMBALTA ) 60 MG extended release capsule Take 1 capsule by mouth daily    clopidogrel  (PLAVIX ) 75 MG tablet Take 1 tablet by mouth daily    aspirin  81 MG chewable tablet Take 1 tablet by mouth daily    amLODIPine  (NORVASC ) 10 MG tablet Take 1 tablet by mouth daily          Objective:     Vitals:  Blood pressure (!) 148/80, pulse 88, temperature 97.9 F (36.6 C), temperature source Oral, resp. rate 20, height 1.499 m (4' 11), weight 54.6 kg (120 lb 5.9 oz), SpO2 100%.  Temp (24hrs), Avg:97.9 F (36.6 C), Min:97.9 F (36.6 C), Max:97.9 F (36.6 C)      Intake and Output:  No intake/output data recorded.  No intake/output data recorded.    Physical Exam:                   Physical Examination:   GENERAL ASSESSMENT: NAD  HEENT:Nontraumatic   CHEST: CTA  HEART: S1S2  ABDOMEN: Soft,NT,  EXTREMITY: No EDEMA  NEURO:Grossly non focal          ECG/rhythm:    Data Review      No results for input(s): TNIPOC in the last 72 hours.    Invalid input(s): ITNL   No results found for: LABPROT, LABALBU   Recent Labs     07/09/24  0832 07/09/24  1552 07/11/24  1026   NA 133* 138 134*   K 6.3* 3.2* 5.7*   CL 96* 97* 95*   CO2 19* 27 23   BUN 65* 20 53*   PHOS 7.9* 2.6  --    MG  --  1.7 2.1   WBC 10.7 7.5 9.1   HGB 12.1 11.9 11.4*   HCT 37.2 36.0 36.0   PLT 413* 351 368      Lab Results   Component Value Date    WBC 9.1 07/11/2024    HGB 11.4 (L) 07/11/2024    HCT 36.0 07/11/2024    MCV 93.3 07/11/2024    PLT 368 07/11/2024    LYMPHOPCT 16.5 07/11/2024    RBC 3.86 07/11/2024    MCH 29.5 07/11/2024    MCHC 31.7 07/11/2024    RDW 14.7 (H) 07/11/2024     Lab Results   Component Value Date    CREATININE 5.66 (H) 07/11/2024     CREATININE 2.73 (H) 07/09/2024    CREATININE 6.30 (H) 07/09/2024      Lab Results   Component Value Date    BUN 53 (H) 07/11/2024                    Discussed with:  patient    Melinda [name] Melinda Doe, MD  07/11/2024      [facility]:  [email or web address]  [email or web address]    [place] office:  [address]  [place], [place] [address]  Phone: [phone]  Fax :     [phone]    [facility] office:  [address]  [place], [place]  [address]  Phone - [phone]  Fax - [phone]

## 2024-07-11 NOTE — ED Triage Notes (Addendum)
 Patient arrives with c/o need for dialysis. Reports that she requires dialysis 3 times per week, MWF, but she currently does not have any clinic to attend.     Pt last had dialysis on Monday.     Reports N/V, and back pain x 5 days.    Dr. Perri in triage.     Pt still able to make urine. Denies dysuria, fever. Last bowel movement was yesterday.

## 2024-07-11 NOTE — Progress Notes (Signed)
 Ultrasound IV by  Leeroy Cerise RN:  Procedure Note    Ultrasound IV education provided to patient. Opportunities for questions given.     Ultrasound used for PIV placement:  22 gauge PIV placed to right forearm, specimens obtained and handed to nurse.  1 X Attempt(s).    Vigorous blood return present and saline flushes with ease.     Procedure tolerated well. Primary RN aware of IV placement and added to LDA.      Leeroy Cerise, RN

## 2024-07-11 NOTE — ED Notes (Signed)
Pt report given to Faith RN in ED

## 2024-07-11 NOTE — ED Provider Notes (Signed)
 [facility] EMERGENCY DEPARTMENT  EMERGENCY DEPARTMENT ENCOUNTER      Pt Name: Melinda [name]  MRN: [id number]  Birthdate 08/26/1981  Date of evaluation: 07/11/2024  Provider: Mylinda CINDERELLA Monte, DO    CHIEF COMPLAINT       Chief Complaint   Patient presents with    Dialysis    Nausea & Vomiting    Back Pain         HISTORY OF PRESENT ILLNESS   (Location/Symptom, Timing/Onset, Context/Setting, Quality, Duration, Modifying Factors, Severity)  Note limiting factors.   HPI    Patient is a 43 year old female primarily Spanish-speaking who presents with missed dialysis and nausea vomiting.  She reports associated right flank pain.  Patient reports she continues to make urine.  She is post be on dialysis Monday Wednesday Friday however her last session was Monday due to difficulty with follow-up per patient.  She reports she did not have insurance and recently got insured.  Denies fever or chills    Review of External Medical Records:     Nursing Notes were reviewed.    REVIEW OF SYSTEMS    (2-9 systems for level 4, 10 or more for level 5)     Review of Systems    Except as noted above the remainder of the review of systems was reviewed and negative.       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)     Gastroparesis     Gastroparesis     GERD (gastroesophageal reflux disease)     High cholesterol     Hypertension          SURGICAL HISTORY       Past Surgical History:   Procedure Laterality Date    CAPSULE ENDOSCOPY N/A 01/20/2023    ESOPHAGEAL CAPSULE ENDOSCOPY remove at 1624PM performed by Madlyn Fendt, MD at [facility] ENDOSCOPY    CHOLECYSTECTOMY, LAPAROSCOPIC N/A 02/03/2023    ROBOTIC LAPAROSCOPIC CHOLECYSTECTOMY with Indocyanine green  performed by Chrystal Elijah BRAVO, MD at [facility] MAIN OR    COLONOSCOPY N/A 01/19/2023    COLONOSCOPY DIAGNOSTIC performed by Madlyn Fendt, MD at [facility] ENDOSCOPY    INVASIVE  VASCULAR N/A 10/03/2023    Angiography visceral SMA performed by Millard Oneil LABOR, MD at [facility] CARDIAC CATH LAB    INVASIVE VASCULAR N/A 10/03/2023    Ultrasound guided vascular access performed by Millard Oneil LABOR, MD at [facility] CARDIAC CATH LAB    INVASIVE VASCULAR N/A 10/03/2023    Insert stent peripheral artery performed by Millard Oneil LABOR, MD at [facility] CARDIAC CATH LAB    IR NONTUNNELED VASCULAR CATHETER > 5 YEARS  10/10/2023    IR NONTUNNELED VASCULAR CATHETER > 5 YEARS 10/10/2023 [facility] CARDIAC CATH/EP/IR LAB    IR NONTUNNELED VASCULAR CATHETER > 5 YEARS  10/10/2023    IR NONTUNNELED VASCULAR CATHETER > 5 YEARS 10/10/2023 [facility] CARDIAC CATH/EP/IR LAB    OTHER SURGICAL HISTORY Left     Rentia attachment    TUBAL LIGATION Bilateral     UPPER GASTROINTESTINAL ENDOSCOPY N/A 01/17/2023    ESOPHAGOGASTRODUODENOSCOPY performed by Madlyn Fendt, MD at [facility] ENDOSCOPY    UPPER GASTROINTESTINAL ENDOSCOPY N/A 01/17/2023    ESOPHAGOGASTRODUODENOSCOPY BIOPSY performed by Madlyn Fendt, MD at [facility] ENDOSCOPY    UPPER GASTROINTESTINAL ENDOSCOPY N/A 01/18/2023    ESOPHAGOGASTRODUODENOSCOPY performed by Madlyn Fendt,  MD at [facility] ENDOSCOPY    UPPER GASTROINTESTINAL ENDOSCOPY N/A 05/13/2023    ESOPHAGOGASTRODUODENOSCOPY performed by Roseann Lonni BIRCH, MD at [facility] ENDOSCOPY    UPPER GASTROINTESTINAL ENDOSCOPY N/A 05/13/2023    ESOPHAGOGASTRODUODENOSCOPY BIOPSY performed by Roseann Lonni BIRCH, MD at [facility] ENDOSCOPY    UPPER GASTROINTESTINAL ENDOSCOPY N/A 09/21/2023    ESOPHAGOGASTRODUODENOSCOPY performed by Madlyn Fendt, MD at [facility] ENDOSCOPY    UPPER GASTROINTESTINAL ENDOSCOPY N/A 09/21/2023    ESOPHAGOGASTRODUODENOSCOPY BIOPSY performed by Madlyn Fendt, MD at [facility] ENDOSCOPY    US  ABSCESS DRAINAGE PERITONEAL/RETROPERITONEAL [facility]  02/11/2023    US  ABSCESS DRAINAGE PERITONEAL 02/11/2023 [facility] RAD US          CURRENT MEDICATIONS       Previous Medications    AMLODIPINE  (NORVASC ) 10 MG TABLET    Take 1 tablet by mouth daily    ASPIRIN  81 MG CHEWABLE TABLET    Take 1  tablet by mouth daily    ATORVASTATIN  (LIPITOR ) 80 MG TABLET    Take 1 tablet by mouth nightly    BUSPIRONE  (BUSPAR ) 15 MG TABLET    Take 15 mg by mouth 3 times daily    CARVEDILOL  (COREG ) 12.5 MG TABLET    Take 1 tablet by mouth 2 times daily (with meals)    CLOPIDOGREL  (PLAVIX ) 75 MG TABLET    Take 1 tablet by mouth daily    DULOXETINE  (CYMBALTA ) 60 MG EXTENDED RELEASE CAPSULE    Take 1 capsule by mouth daily    FAMOTIDINE  (PEPCID ) 20 MG TABLET    Take 1 tablet by mouth 2 times daily    HYDRALAZINE  (APRESOLINE ) 50 MG TABLET    Take 1 tablet by mouth 3 times daily    LIDOCAINE  4 % EXTERNAL PATCH    Place 1 patch onto the skin daily    METFORMIN  (GLUCOPHAGE -XR) 500 MG EXTENDED RELEASE TABLET    Take 2 tablets by mouth daily (with breakfast)    METOCLOPRAMIDE  (REGLAN ) 5 MG TABLET    Take 1 tablet by mouth 3 times daily (before meals)    ONDANSETRON  (ZOFRAN -ODT) 4 MG DISINTEGRATING TABLET    Take 1 tablet by mouth every 8 hours as needed for Nausea or Vomiting    PANTOPRAZOLE  (PROTONIX ) 40 MG TABLET    Take 1 tablet by mouth 2 times daily (before meals)    PROCHLORPERAZINE  (COMPAZINE ) 10 MG TABLET    Take 1 tablet by mouth every 6 hours as needed (nausea or vomiting)    SENNOSIDES-DOCUSATE SODIUM  (SENOKOT-S) 8.6-50 MG TABLET    Take 1 tablet by mouth in the morning and at bedtime    SEVELAMER  (RENVELA ) 0.8 G PACK PACKET    Take 1 packet by mouth 3 times daily (with meals)    SODIUM BICARBONATE  650 MG TABLET    Take 1 tablet by mouth 3 times daily       ALLERGIES     Patient has no known allergies.    FAMILY HISTORY     No family history on file.       SOCIAL HISTORY       Social History     Socioeconomic History    Marital status: Married   Tobacco Use    Smoking status: Never    Smokeless tobacco: Never   Vaping Use    Vaping status: Never Used   Substance and Sexual Activity    Alcohol use: Never    Drug use: Never    Sexual activity: Defer  Social Drivers of Psychologist, prison and probation services Strain: Low Risk   (05/02/2024)    Received from [facility]    Overall Financial Resource Strain (CARDIA)     Difficulty of Paying Living Expenses: Not hard at all   Recent Concern: Financial Resource Strain - High Risk (04/02/2024)    Received from [facility]    Overall Financial Resource Strain (CARDIA)     Difficulty of Paying Living Expenses: Hard   Food Insecurity: No Food Insecurity (06/26/2024)    Hunger Vital Sign     Worried About Running Out of Food in the Last Year: Never true     Ran Out of Food in the Last Year: Never true   Recent Concern: Food Insecurity - Food Insecurity Present (04/02/2024)    Received from [facility]    Hunger Vital Sign     Within the past 12 months, you worried that your food would run out before you got the money to buy more.: Often true     Within the past 12 months, the food you bought just didn't last and you didn't have money to get more.: Often true   Transportation Needs: No Transportation Needs (06/26/2024)    PRAPARE - Therapist, art (Medical): No     Lack of Transportation (Non-Medical): No   Recent Concern: Transportation Needs - Unmet Transportation Needs (05/02/2024)    Received from [facility] - Transportation     In the past 12 months, has lack of transportation kept you from medical appointments or from getting medications?: No     Lack of Transportation (Non-Medical): Yes   Housing Stability: Low Risk  (06/26/2024)    Housing Stability Vital Sign     Unable to Pay for Housing in the Last Year: No     Number of Times Moved in the Last Year: 0     Homeless in the Last Year: No           PHYSICAL EXAM    (up to 7 for level 4, 8 or more for level 5)     ED Triage Vitals [07/11/24 0848]   BP Girls Systolic BP Percentile Girls Diastolic BP Percentile Boys Systolic BP Percentile Boys Diastolic BP Percentile Temp Temp Source Pulse   (!) 148/80 -- -- -- -- 97.9 F (36.6 C) Oral 88       Respirations SpO2 Height Weight - Scale       20 100 % 1.499 m (4' 11) 54.6 kg (120 lb 5.9 oz)           Body mass index is 24.31 kg/m.    Physical Exam    Vital Signs Reviewed in Triage Note    GENERAL APPEARANCE: NAD Activity normal for age Well developed/Well nourished AOX3  EYES: Lids/conjunctiva normal PEERL EOMI No pallor  ENT: Mucous membranes moist Nares normal   HEAD/NECK: Normocephalic Atraumatic Neck is supple No nuchal rigidity  RESPIRATORY: Respiratory effort normal Speaks in full sentences No accessory muscle use Lungs CTA without rhonchi wheezes or rales  CARDIAC: Regular rate and rhythm No murmurs or gallops  CHEST WALL: Non-tender  ABDOMEN: Soft Non-tender No distension No rebound or guarding   MUSCULOSKELETAL/EXTREMITIES: No abnormal range of motion No swelling No bony ttp LUE Diialysis access palpable thrill  SKIN: Warm, pink and dry No rashes petechiae or lesions No cyanosis pallor or  diaphoresis   NEUROLOGICAL: Speech is clear No slurring No facial droop CN 2-12 grossly intact Normal level of consciousness Gait normal 5/5 strength in all extremities  PSYC: Normal mood and affect Normal behavior        DIAGNOSTIC RESULTS     EKG: All EKG's are interpreted by the Emergency Department Physician who either signs or Co-signs this chart in the absence of a cardiologist.        RADIOLOGY:   Non-plain film images such as CT, Ultrasound and MRI are read by the radiologist. Plain radiographic images are visualized and preliminarily interpreted by the emergency physician with the below findings:        Interpretation per the Radiologist below, if available at the time of this note:    XR CHEST PORTABLE    (Results Pending)        LABS:  Labs Reviewed   URINE CULTURE HOLD SAMPLE   CBC WITH AUTO DIFFERENTIAL   COMPREHENSIVE METABOLIC PANEL   EXTRA TUBES HOLD   URINALYSIS WITH MICROSCOPIC   MAGNESIUM        All other labs were within normal range or not returned as of this dictation.    EMERGENCY  DEPARTMENT COURSE and DIFFERENTIAL DIAGNOSIS/MDM:   Vitals:    Vitals:    07/11/24 0848   BP: (!) 148/80   Pulse: 88   Resp: 20   Temp: 97.9 F (36.6 C)   TempSrc: Oral   SpO2: 100%   Weight: 54.6 kg (120 lb 5.9 oz)   Height: 1.499 m (4' 11)           Medical Decision Making  Amount and/or Complexity of Data Reviewed  Labs: ordered.  Radiology: ordered.    Risk  Prescription drug management.      Patient with frequent ED visits noted to have a ED visit Monday (2 days prior) and received a session of dialysis with [facility] Dr. Jethava. Per patient she has not been set up with OP dialysis    Labs sent to evaluate for electrolyte imbalance, EKG to eval for peaked t waves/arrhythmia, cxr r/o pulmonary vascular congestion  11A Patient reports n/v/d diagnosed as gastroenteritis prior visit Requests pain medication (for back pain) and nausea meds. Denies abd pain. Abdomen is soft nontender. Ordered for zofran , protonix , morphine . Low suspicion for acute abdominal pain or need for surgical intervention     1140A Discussed case with Case management. [facility] is familiar with patient. Insurance began today. Working on placement. Recommends HD today. Nephrology consult placed    250P Patient continues on HD. Nephrology set up HD and plan made for patient to follow up with [facility] MWF 1p    REASSESSMENT            CONSULTS:  None    PROCEDURES:  Unless otherwise noted below, none     Procedures      FINAL IMPRESSION    No diagnosis found.      DISPOSITION/PLAN   DISPOSITION        PATIENT REFERRED TO:  No follow-up provider specified.    DISCHARGE MEDICATIONS:  New Prescriptions    No medications on file         (Please note that portions of this note were completed with a voice recognition program.  Efforts were made to edit the dictations but occasionally words are mis-transcribed.)    Mylinda CINDERELLA Monte, DO (electronically signed)  Emergency Attending Physician / Physician Assistant /  Nurse Practitioner             Perri Mylinda GRADE, DO  07/12/24 [phone]

## 2024-07-12 LAB — HEPATITIS B CORE AB IGG/IGM
Hep B Core Ab, IgM: NEGATIVE
Hep B Core Total Ab: NEGATIVE

## 2024-07-20 ENCOUNTER — Inpatient Hospital Stay
Admit: 2024-07-20 | Discharge: 2024-07-20 | Disposition: A | Discharge status: Home / Self Care | Payer: PRIVATE HEALTH INSURANCE | Arrived: VH | Attending: Emergency Medicine

## 2024-07-20 ENCOUNTER — Emergency Department: Admit: 2024-07-20 | Payer: PRIVATE HEALTH INSURANCE

## 2024-07-20 DIAGNOSIS — R112 Nausea with vomiting, unspecified: Secondary | ICD-10-CM

## 2024-07-20 LAB — COMPREHENSIVE METABOLIC PANEL
ALT: 70 U/L — ABNORMAL HIGH (ref 10–35)
AST: 43 U/L — ABNORMAL HIGH (ref 10–35)
Albumin/Globulin Ratio: 0.9 — ABNORMAL LOW (ref 1.1–2.2)
Albumin/Globulin Ratio: 0.8 — ABNORMAL LOW (ref 1.1–2.2)
Albumin: 3.2 g/dL — ABNORMAL LOW (ref 3.5–5.2)
Albumin: 2.7 g/dL — ABNORMAL LOW (ref 3.5–5.2)
Alk Phosphatase: 347 U/L — ABNORMAL HIGH (ref 35–104)
Alk Phosphatase: 296 U/L — ABNORMAL HIGH (ref 35–104)
Anion Gap: 14 mmol/L (ref 2–14)
Anion Gap: 12 mmol/L (ref 2–14)
BUN/Creatinine Ratio: 8 — ABNORMAL LOW (ref 12–20)
BUN/Creatinine Ratio: 8 — ABNORMAL LOW (ref 12–20)
BUN: 33 mg/dL — ABNORMAL HIGH (ref 6–20)
BUN: 31 mg/dL — ABNORMAL HIGH (ref 6–20)
CO2: 25 mmol/L (ref 20–29)
CO2: 24 mmol/L (ref 20–29)
Calcium: 8.6 mg/dL (ref 8.6–10.0)
Calcium: 7.9 mg/dL — ABNORMAL LOW (ref 8.6–10.0)
Chloride: 100 mmol/L (ref 98–107)
Chloride: 102 mmol/L (ref 98–107)
Creatinine: 4.37 mg/dL — ABNORMAL HIGH (ref 0.60–1.00)
Creatinine: 4.18 mg/dL — ABNORMAL HIGH (ref 0.60–1.00)
Est, Glom Filt Rate: 12 ml/min/1.73m2 — ABNORMAL LOW (ref >59)
Est, Glom Filt Rate: 13 ml/min/1.73m2 — ABNORMAL LOW (ref >59)
Globulin: 3.7 g/dL (ref 2.0–4.0)
Globulin: 3.4 g/dL (ref 2.0–4.0)
Glucose: 259 mg/dL — ABNORMAL HIGH (ref 65–100)
Glucose: 217 mg/dL — ABNORMAL HIGH (ref 65–100)
Potassium: 5.3 mmol/L — ABNORMAL HIGH (ref 3.5–5.1)
Sodium: 139 mmol/L (ref 136–145)
Sodium: 137 mmol/L (ref 136–145)
Total Bilirubin: 0.3 mg/dL (ref 0.0–1.2)
Total Bilirubin: 0.3 mg/dL (ref 0.0–1.2)
Total Protein: 6.9 g/dL (ref 6.4–8.3)
Total Protein: 6.1 g/dL — ABNORMAL LOW (ref 6.4–8.3)

## 2024-07-20 LAB — URINALYSIS WITH MICROSCOPIC
BACTERIA, URINE: NEGATIVE /HPF
Bilirubin, Urine: NEGATIVE
Glucose, Ur: 500 mg/dL — AB
Ketones, Urine: NEGATIVE mg/dL
Leukocyte Esterase, Urine: NEGATIVE
Nitrite, Urine: NEGATIVE
Protein, UA: >300 mg/dL — AB
Specific Gravity, UA: 1.017 (ref 1.003–1.030)
Urobilinogen, Urine: 0.2 EU/dL (ref 0.2–1.0)
pH, Urine: 8.5 — ABNORMAL HIGH (ref 5.0–8.0)

## 2024-07-20 LAB — COVID-19 & INFLUENZA COMBO
Rapid Influenza A By PCR: NOT DETECTED
Rapid Influenza B By PCR: NOT DETECTED
SARS-CoV-2, PCR: NOT DETECTED

## 2024-07-20 LAB — CBC WITH AUTO DIFFERENTIAL
Basophils %: 1.1 % — ABNORMAL HIGH (ref 0.0–1.0)
Basophils Absolute: 0.12 K/UL — ABNORMAL HIGH (ref 0.00–0.10)
Eosinophils %: 6.3 % (ref 0.0–7.0)
Eosinophils Absolute: 0.72 K/UL — ABNORMAL HIGH (ref 0.00–0.40)
Hematocrit: 32.4 % — ABNORMAL LOW (ref 35.0–47.0)
Hemoglobin: 10.5 g/dL — ABNORMAL LOW (ref 11.5–16.0)
Immature Granulocytes %: 0.3 % (ref 0.0–0.5)
Immature Granulocytes Absolute: 0.03 K/UL (ref 0.00–0.04)
Lymphocytes %: 17.5 % (ref 12.0–49.0)
Lymphocytes Absolute: 1.99 K/UL (ref 0.80–3.50)
MCH: 30.3 pg (ref 26.0–34.0)
MCHC: 32.4 g/dL (ref 30.0–36.5)
MCV: 93.4 FL (ref 80.0–99.0)
MPV: 9.1 FL (ref 8.9–12.9)
Monocytes %: 5.5 % (ref 5.0–13.0)
Monocytes Absolute: 0.63 K/UL (ref 0.00–1.00)
Neutrophils %: 69.3 % (ref 32.0–75.0)
Neutrophils Absolute: 7.9 K/UL (ref 1.80–8.00)
Nucleated RBCs: 0 /100{WBCs}
Platelets: 302 K/uL (ref 150–400)
RBC: 3.47 M/uL — ABNORMAL LOW (ref 3.80–5.20)
RDW: 14.3 % (ref 11.5–14.5)
WBC: 11.4 K/uL — ABNORMAL HIGH (ref 3.6–11.0)
nRBC: 0 K/uL (ref 0.00–0.01)

## 2024-07-20 LAB — MAGNESIUM: Magnesium: 2 mg/dL (ref 1.6–2.6)

## 2024-07-20 LAB — POCT GLUCOSE: POC Glucose: 253 mg/dL — ABNORMAL HIGH (ref 65–117)

## 2024-07-20 LAB — LIPASE: Lipase: 24 U/L (ref 13–60)

## 2024-07-20 MED ORDER — ONDANSETRON HCL 4 MG/2ML IJ SOLN
4 | Freq: Once | INTRAMUSCULAR | Status: DC
Start: 2024-07-20 — End: 2024-07-20

## 2024-07-20 MED ORDER — SODIUM CHLORIDE 0.9 % IV BOLUS
0.9 | Freq: Once | INTRAVENOUS | Status: AC
Start: 2024-07-20 — End: 2024-07-20
  Administered 2024-07-20: 17:00:00 1000 mL via INTRAVENOUS

## 2024-07-20 MED ORDER — ACETAMINOPHEN 500 MG PO TABS
500 | ORAL | Status: DC
Start: 2024-07-20 — End: 2024-07-20

## 2024-07-20 MED ORDER — METOCLOPRAMIDE HCL 5 MG PO TABS
5 | ORAL_TABLET | Freq: Three times a day (TID) | ORAL | 0 refills | 35.00000 days | Status: DC | PRN
Start: 2024-07-20 — End: 2024-08-28

## 2024-07-20 MED ORDER — METOCLOPRAMIDE HCL 5 MG/ML IJ SOLN
5 | Freq: Once | INTRAMUSCULAR | Status: AC
Start: 2024-07-20 — End: 2024-07-20
  Administered 2024-07-20: 21:00:00 5 mg via INTRAVENOUS

## 2024-07-20 MED ORDER — METOCLOPRAMIDE HCL 5 MG/ML IJ SOLN
5 | INTRAMUSCULAR | Status: DC
Start: 2024-07-20 — End: 2024-07-20

## 2024-07-20 MED ORDER — FENTANYL CITRATE (PF) 100 MCG/2ML IJ SOLN
100 | INTRAMUSCULAR | Status: AC
Start: 2024-07-20 — End: 2024-07-20
  Administered 2024-07-20: 19:00:00 25 ug via INTRAVENOUS

## 2024-07-20 MED FILL — FENTANYL CITRATE (PF) 100 MCG/2ML IJ SOLN: 100 MCG/2ML | INTRAMUSCULAR | Qty: 2 | Fill #0

## 2024-07-20 MED FILL — METOCLOPRAMIDE HCL 5 MG/ML IJ SOLN: 5 mg/mL | INTRAMUSCULAR | Qty: 2 | Fill #0

## 2024-07-20 MED FILL — ACETAMINOPHEN EXTRA STRENGTH 500 MG PO TABS: 500 mg | ORAL | Qty: 2 | Fill #0

## 2024-07-20 MED FILL — SODIUM CHLORIDE 0.9 % IV SOLN: 0.9 % | INTRAVENOUS | Qty: 1000 | Fill #0

## 2024-07-20 NOTE — ED Provider Notes (Signed)
 "       [facility] EMERGENCY DEPARTMENT  EMERGENCY DEPARTMENT ENCOUNTER      Pt Name: Melinda [name]  MRN: [id number]  Birthdate Feb 27, 1981  Date of evaluation: 07/20/2024  Provider: Lavanda HERO Inza Mikrut, PA-C    CHIEF COMPLAINT       Chief Complaint   Patient presents with    Vomiting         HISTORY OF PRESENT ILLNESS   (Location/Symptom, Timing/Onset, Context/Setting, Quality, Duration, Modifying Factors, Severity)  Note limiting factors.   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  She also reports generalized back pain and cough.  Denies any injury to the spine, new numbness or tingling, urinary incontinence.  She does make urine.  Denies diarrhea or dysuria.  Denies fevers.     The history is provided by the patient.         Review of External Medical Records:     Nursing Notes were reviewed.    REVIEW OF SYSTEMS    (2-9 systems for level 4, 10 or more for level 5)     Review of Systems    Except as noted above the remainder of the review of systems was reviewed and negative.       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)     Gastroparesis     Gastroparesis     GERD (gastroesophageal reflux disease)     High cholesterol     Hypertension          SURGICAL HISTORY       Past Surgical History:   Procedure Laterality Date    CAPSULE ENDOSCOPY N/A 01/20/2023    ESOPHAGEAL CAPSULE ENDOSCOPY remove at 1624PM performed by Madlyn Fendt, MD at [facility] ENDOSCOPY    CHOLECYSTECTOMY, LAPAROSCOPIC N/A 02/03/2023    ROBOTIC LAPAROSCOPIC CHOLECYSTECTOMY with Indocyanine green  performed by Chrystal Elijah BRAVO, MD at [facility] MAIN OR    COLONOSCOPY N/A 01/19/2023    COLONOSCOPY DIAGNOSTIC performed by Madlyn Fendt, MD at [facility] ENDOSCOPY    INVASIVE VASCULAR N/A 10/03/2023    Angiography  visceral SMA performed by Millard Oneil LABOR, MD at [facility] CARDIAC CATH LAB    INVASIVE VASCULAR N/A 10/03/2023    Ultrasound guided vascular access performed by Millard Oneil LABOR, MD at [facility] CARDIAC CATH LAB    INVASIVE VASCULAR N/A 10/03/2023    Insert stent peripheral artery performed by Millard Oneil LABOR, MD at [facility] CARDIAC CATH LAB    IR NONTUNNELED VASCULAR CATHETER > 5 YEARS  10/10/2023    IR NONTUNNELED VASCULAR CATHETER > 5 YEARS 10/10/2023 [facility] CARDIAC CATH/EP/IR LAB    IR NONTUNNELED VASCULAR CATHETER > 5 YEARS  10/10/2023    IR NONTUNNELED VASCULAR CATHETER > 5 YEARS 10/10/2023 [facility] CARDIAC CATH/EP/IR LAB    OTHER SURGICAL HISTORY Left     Rentia attachment    TUBAL LIGATION Bilateral     UPPER GASTROINTESTINAL ENDOSCOPY N/A 01/17/2023    ESOPHAGOGASTRODUODENOSCOPY performed by Madlyn Fendt, MD at [facility] ENDOSCOPY    UPPER GASTROINTESTINAL ENDOSCOPY N/A 01/17/2023    ESOPHAGOGASTRODUODENOSCOPY BIOPSY performed by Madlyn Fendt, MD at [facility] ENDOSCOPY    UPPER GASTROINTESTINAL ENDOSCOPY N/A 01/18/2023    ESOPHAGOGASTRODUODENOSCOPY performed by Madlyn Fendt,  MD at [facility] ENDOSCOPY    UPPER GASTROINTESTINAL ENDOSCOPY N/A 05/13/2023    ESOPHAGOGASTRODUODENOSCOPY performed by Roseann Lonni BIRCH, MD at [facility] ENDOSCOPY    UPPER GASTROINTESTINAL ENDOSCOPY N/A 05/13/2023    ESOPHAGOGASTRODUODENOSCOPY BIOPSY performed by Roseann Lonni BIRCH, MD at [facility] ENDOSCOPY    UPPER GASTROINTESTINAL ENDOSCOPY N/A 09/21/2023    ESOPHAGOGASTRODUODENOSCOPY performed by Madlyn Fendt, MD at [facility] ENDOSCOPY    UPPER GASTROINTESTINAL ENDOSCOPY N/A 09/21/2023    ESOPHAGOGASTRODUODENOSCOPY BIOPSY performed by Madlyn Fendt, MD at [facility] ENDOSCOPY    US  FLUID COLLECTION DRAINAGE PERITONEAL/RETROPERITONEAL [facility]  02/11/2023    US  ABSCESS DRAINAGE PERITONEAL 02/11/2023 [facility] RAD US          CURRENT MEDICATIONS       Current Discharge Medication List        CONTINUE these medications which have NOT CHANGED    Details   lidocaine  4 % external patch Place 1 patch onto the skin  daily  Qty: 15 patch, Refills: 0      sevelamer  (RENVELA ) 0.8 g PACK packet Take 1 packet by mouth 3 times daily (with meals)  Qty: 270 each, Refills: 1      atorvastatin  (LIPITOR ) 80 MG tablet Take 1 tablet by mouth nightly      carvedilol  (COREG ) 12.5 MG tablet Take 1 tablet by mouth 2 times daily (with meals)      famotidine  (PEPCID ) 20 MG tablet Take 1 tablet by mouth 2 times daily      hydrALAZINE  (APRESOLINE ) 50 MG tablet Take 1 tablet by mouth 3 times daily      sodium bicarbonate  650 MG tablet Take 1 tablet by mouth 3 times daily  Qty: 90 tablet, Refills: 0      pantoprazole  (PROTONIX ) 40 MG tablet Take 1 tablet by mouth 2 times daily (before meals)  Qty: 30 tablet, Refills: 3      sennosides-docusate sodium  (SENOKOT-S) 8.6-50 MG tablet Take 1 tablet by mouth in the morning and at bedtime  Qty: 60 tablet, Refills: 1      ondansetron  (ZOFRAN -ODT) 4 MG disintegrating tablet Take 1 tablet by mouth every 8 hours as needed for Nausea or Vomiting  Qty: 30 tablet, Refills: 0      prochlorperazine  (COMPAZINE ) 10 MG tablet Take 1 tablet by mouth every 6 hours as needed (nausea or vomiting)  Qty: 120 tablet, Refills: 3      metFORMIN  (GLUCOPHAGE -XR) 500 MG extended release tablet Take 2 tablets by mouth daily (with breakfast)  Qty: 90 tablet, Refills: 1      busPIRone  (BUSPAR ) 15 MG tablet Take 15 mg by mouth 3 times daily  Qty: 30 tablet, Refills: 0      DULoxetine  (CYMBALTA ) 60 MG extended release capsule Take 1 capsule by mouth daily  Qty: 30 capsule, Refills: 3      clopidogrel  (PLAVIX ) 75 MG tablet Take 1 tablet by mouth daily  Qty: 30 tablet, Refills: 3      aspirin  81 MG chewable tablet Take 1 tablet by mouth daily  Qty: 30 tablet, Refills: 3      amLODIPine  (NORVASC ) 10 MG tablet Take 1 tablet by mouth daily  Qty: 30 tablet, Refills: 2             ALLERGIES     Patient has no known allergies.    FAMILY HISTORY     History reviewed. No pertinent family history.       SOCIAL HISTORY       Social History  Socioeconomic History    Marital status: Married     Spouse name: None    Number of children: None    Years of education: None    Highest education level: None   Tobacco Use    Smoking status: Never    Smokeless tobacco: Never   Vaping Use    Vaping status: Never Used   Substance and Sexual Activity    Alcohol use: Never    Drug use: Never    Sexual activity: Defer     Social Drivers of Health     Financial Resource Strain: Low Risk  (05/02/2024)    Received from [facility]    Overall Financial Resource Strain (CARDIA)     Difficulty of Paying Living Expenses: Not hard at all   Recent Concern: Financial Resource Strain - High Risk (04/02/2024)    Received from [facility]    Overall Financial Resource Strain (CARDIA)     Difficulty of Paying Living Expenses: Hard   Food Insecurity: No Food Insecurity (06/26/2024)    Hunger Vital Sign     Worried About Running Out of Food in the Last Year: Never true     Ran Out of Food in the Last Year: Never true   Recent Concern: Food Insecurity - Food Insecurity Present (04/02/2024)    Received from [facility]    Hunger Vital Sign     Within the past 12 months, you worried that your food would run out before you got the money to buy more.: Often true     Within the past 12 months, the food you bought just didn't last and you didn't have money to get more.: Often true   Transportation Needs: No Transportation Needs (06/26/2024)    PRAPARE - Therapist, Art (Medical): No     Lack of Transportation (Non-Medical): No   Recent Concern: Transportation Needs - Unmet Transportation Needs (05/02/2024)    Received from [facility] - Transportation     In the past 12 months, has lack of transportation kept you from medical appointments or from getting medications?: No     Lack of Transportation (Non-Medical): Yes   Housing Stability: Low Risk  (06/26/2024)    Housing Stability Vital  Sign     Unable to Pay for Housing in the Last Year: No     Number of Times Moved in the Last Year: 0     Homeless in the Last Year: No           PHYSICAL EXAM    (up to 7 for level 4, 8 or more for level 5)     ED Triage Vitals   BP Girls Systolic BP Percentile Girls Diastolic BP Percentile Boys Systolic BP Percentile Boys Diastolic BP Percentile Temp Temp Source Pulse   07/20/24 1217 -- -- -- -- 07/20/24 1213 07/20/24 1213 07/20/24 1213   (!) 151/124     98.8 F (37.1 C) Oral (!) 105      Respirations SpO2 Height Weight - Scale       07/20/24 1213 07/20/24 1213 07/20/24 1213 07/20/24 1213       20 95 % 1.499 m (4' 11) 57.2 kg (126 lb 1.7 oz)           Body mass index is 25.47 kg/m.    Physical Exam  Constitutional:       Appearance:  Normal appearance.   HENT:      Head: Normocephalic and atraumatic.      Mouth/Throat:      Mouth: Mucous membranes are dry.   Eyes:      Extraocular Movements: Extraocular movements intact.      Conjunctiva/sclera: Conjunctivae normal.   Cardiovascular:      Rate and Rhythm: Normal rate and regular rhythm.   Pulmonary:      Effort: Pulmonary effort is normal.   Abdominal:      General: Abdomen is flat.      Palpations: Abdomen is soft.      Tenderness: There is abdominal tenderness in the epigastric area.   Skin:     General: Skin is warm and dry.   Neurological:      Mental Status: She is alert. Mental status is at baseline.   Psychiatric:         Mood and Affect: Mood normal.         Behavior: Behavior normal.         DIAGNOSTIC RESULTS     EKG: All EKG's are interpreted by the Emergency Department Physician who either signs or Co-signs this chart in the absence of a cardiologist.        RADIOLOGY:   Non-plain film images such as CT, Ultrasound and MRI are read by the radiologist. Plain radiographic images are visualized and preliminarily interpreted by the emergency physician with the below findings:        Interpretation per the Radiologist below, if available at the time of  this note:    CT ABDOMEN PELVIS WO CONTRAST Additional Contrast? None   Final Result   No acute abdominal or pelvic abnormality. No CT evidence for urolithiasis or   urinary obstruction. Stable pleural effusions, cardiomegaly and pericardial   effusion. Stable gastrojejunostomy tube      Electronically signed by Berle Comfort           LABS:  Labs Reviewed   CBC WITH AUTO DIFFERENTIAL - Abnormal; Notable for the following components:       Result Value    WBC 11.4 (*)     RBC 3.47 (*)     Hemoglobin 10.5 (*)     Hematocrit 32.4 (*)     Basophils % 1.1 (*)     Eosinophils Absolute 0.72 (*)     Basophils Absolute 0.12 (*)     All other components within normal limits   COMPREHENSIVE METABOLIC PANEL - Abnormal; Notable for the following components:    Glucose 259 (*)     BUN 33 (*)     Creatinine 4.37 (*)     BUN/Creatinine Ratio 8 (*)     Est, Glom Filt Rate 12 (*)     Alk Phosphatase 347 (*)     Albumin  3.2 (*)     Albumin /Globulin Ratio 0.9 (*)     All other components within normal limits   URINALYSIS WITH MICROSCOPIC - Abnormal; Notable for the following components:    pH, Urine 8.5 (*)     Protein, UA >300 (*)     Glucose, Ur 500 (*)     Blood, Urine TRACE (*)     All other components within normal limits   COMPREHENSIVE METABOLIC PANEL - Abnormal; Notable for the following components:    Potassium 5.3 (*)     Glucose 217 (*)     BUN 31 (*)     Creatinine 4.18 (*)  BUN/Creatinine Ratio 8 (*)     Est, Glom Filt Rate 13 (*)     Calcium  7.9 (*)     ALT 70 (*)     AST 43 (*)     Alk Phosphatase 296 (*)     Total Protein 6.1 (*)     Albumin  2.7 (*)     Albumin /Globulin Ratio 0.8 (*)     All other components within normal limits   POCT GLUCOSE - Abnormal; Notable for the following components:    POC Glucose 253 (*)     All other components within normal limits   COVID-19 & INFLUENZA COMBO   MAGNESIUM    LIPASE   LIPASE       All other labs were within normal range or not returned as of this  dictation.    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  She also reports generalized back pain and cough.  Denies any injury to the spine, new numbness or tingling, urinary incontinence.  She does make urine.  Denies diarrhea or dysuria.  Denies fevers.  She has had similar presentations in the past related to gastroparesis.  She is hypertensive on arrival and mildly tachycardic.  Afebrile.  Breathing comfortably and saturating well on room air.  Abdomen is soft, nondistended.  She has some epigastric tenderness without rebound or guarding.  No right upper quadrant tenderness.  Negative Murphy sign.      She has a mildly leukocytosis on CBC which I suspect is stress related due to her vomiting.  She has a mild normocytic anemia of 10.5.  Initial CMP is hemolyzed.  Repeat obtained which shows a potassium of 5.3.  No evidence of metabolic acidosis.  She has a hyperglycemia of 217 without evidence of ketoacidosis.  This glucose level is quite controlled for her.  She does have elevated transaminases which is chronic and actually improved from prior.  She does not have any right upper quadrant tenderness.  T. bili is WNL.  Low concern for biliary process.  Lipase is WNL and low concern for pancreatitis.  UA does not show any evidence of infection.  CT abdomen pelvis without any acute intra-abdominal pathology.  She was treated in the ED with IV fluids and Reglan  with resolution of vomiting.  Tolerated p.o. challenge.  Of note, she did have an isolated documented SpO2 of 87%.  However, on  bedside exam, patient is saturating at 96%.  She denies any shortness of breath.  She is breathing comfortably.  I did see her pulse ox briefly dropped down to 87% with very poor waveform.  A secondary pulse ox was placed on another finger which showed oxygen saturation of 96% with good waveform.  Patient ambulated to the bathroom with stable pulse ox at 94%.  Believe she is safe for  discharge home at this time.  She knows to follow-up with nephrology for HD.  Given strict return precautions and discharged in stable condition.  Provided a prescription for Reglan  to use at home with suspected source of symptoms being her gastroparesis.    Presentation, management, and disposition were discussed with the attending physician, Dr. Joesph who is in agreement with plan of care.     Amount and/or Complexity of Data Reviewed  Labs: ordered. Decision-making details documented in ED Course.  Radiology: ordered. Decision-making details documented in ED Course.    Risk  OTC drugs.  Prescription drug management.            REASSESSMENT            CONSULTS:  None    PROCEDURES:  Unless otherwise noted below, none     Procedures      FINAL IMPRESSION      1. Nausea and vomiting, unspecified vomiting type    2. Abdominal pain, epigastric          DISPOSITION/PLAN   DISPOSITION Decision To Discharge 07/20/2024 05:08:50 PM      PATIENT REFERRED TO:  [facility]  [address]  [place]  [address]  [phone]  Schedule an appointment as soon as possible for a visit in 2 days      [facility] Emergency Department  [address] [place]  [place]  [phone]  [phone]  Go to   If symptoms worsen    [facility]  [address]  [place]  [address]  [phone]  Schedule an appointment as soon as possible for a visit in 2 days        DISCHARGE MEDICATIONS:  Current Discharge Medication List            (Please note that portions of this note  were completed with a voice recognition program.  Efforts were made to edit the dictations but occasionally words are mis-transcribed.)    Lavanda HERO Irvan Tiedt, PA-C (electronically signed)  Emergency Attending Physician / Physician Assistant / Nurse Practitioner             Ardenia Stiner, Lavanda HERO, PA-C  07/20/24 1714    "

## 2024-07-20 NOTE — Discharge Instructions (Signed)
"  Thank you for allowing us  to provide you with medical care today.  We realize that you have many choices for your emergency care needs.  We thank you for choosing [facility].  Please choose us  in the future for any continued health care needs.     The exam and treatment you received in the Emergency Department were for an emergent problem and are not intended as complete care. It is important that you follow up with a doctor, nurse practitioner, or physician assistant for ongoing care. If your symptoms worsen or you do not improve as expected and you are unable to reach your usual health care provider, you should return to the Emergency Department. We are available 24 hours a day.     Please make an appointment with your healthcare provider(s) for follow up of your Emergency Department visit.  Take this sheet with you when you go to your follow-up visit.    Continue to monitor symptoms at home.  Follow-up with PCP and return with any changes or worsening.    "

## 2024-07-20 NOTE — ED Triage Notes (Signed)
"  Spanish interpretor [phone] used for triage.     Ambulatory with complaints of cough and vomiting starting yesterday. Patient is actively vomiting and diaphoretic during triage.     Also with generalized back pain and epigastric pain. Receives hemodialysis on M, W, F, last received on Wednesday.   "

## 2024-07-20 NOTE — Consults (Signed)
 Session ID: [id number]  Session Duration: 10 minutes  Language: Spanish  Interpreter ID: #[id number]  Interpreter Name: Aloysius

## 2024-08-06 ENCOUNTER — Inpatient Hospital Stay
Admission: EM | Admit: 2024-08-06 | Discharge: 2024-08-28 | Disposition: A | Discharge status: Home / Self Care | Payer: PRIVATE HEALTH INSURANCE | Admitting: Student in an Organized Health Care Education/Training Program

## 2024-08-06 ENCOUNTER — Emergency Department: Admit: 2024-08-06 | Payer: PRIVATE HEALTH INSURANCE

## 2024-08-06 DIAGNOSIS — E1143 Type 2 diabetes mellitus with diabetic autonomic (poly)neuropathy: Secondary | ICD-10-CM

## 2024-08-06 DIAGNOSIS — I161 Hypertensive emergency: Principal | ICD-10-CM

## 2024-08-06 LAB — CBC WITH AUTO DIFFERENTIAL
Basophils %: 0.8 % (ref 0.0–1.0)
Basophils Absolute: 0.08 K/UL (ref 0.00–0.10)
Eosinophils %: 4 % (ref 0.0–7.0)
Eosinophils Absolute: 0.41 K/UL — ABNORMAL HIGH (ref 0.00–0.40)
Hematocrit: 35.1 % (ref 35.0–47.0)
Hemoglobin: 12.1 g/dL (ref 11.5–16.0)
Immature Granulocytes %: 0.9 % — ABNORMAL HIGH (ref 0.0–0.5)
Immature Granulocytes Absolute: 0.09 K/UL — ABNORMAL HIGH (ref 0.00–0.04)
Lymphocytes %: 14.3 % (ref 12.0–49.0)
Lymphocytes Absolute: 1.45 K/UL (ref 0.80–3.50)
MCH: 31.2 pg (ref 26.0–34.0)
MCHC: 34.5 g/dL (ref 30.0–36.5)
MCV: 90.5 FL (ref 80.0–99.0)
MPV: 9.1 FL (ref 8.9–12.9)
Monocytes %: 5.1 % (ref 5.0–13.0)
Monocytes Absolute: 0.52 K/UL (ref 0.00–1.00)
Neutrophils %: 74.9 % (ref 32.0–75.0)
Neutrophils Absolute: 7.61 K/UL (ref 1.80–8.00)
Nucleated RBCs: 0 /100{WBCs}
Platelets: 405 K/uL — ABNORMAL HIGH (ref 150–400)
RBC: 3.88 M/uL (ref 3.80–5.20)
RDW: 14.6 % — ABNORMAL HIGH (ref 11.5–14.5)
WBC: 10.2 K/uL (ref 3.6–11.0)
nRBC: 0 K/uL (ref 0.00–0.01)

## 2024-08-06 LAB — LIPASE: Lipase: 30 U/L (ref 13–60)

## 2024-08-06 LAB — COMPREHENSIVE METABOLIC PANEL
ALT: 48 U/L — ABNORMAL HIGH (ref 10–35)
AST: 42 U/L — ABNORMAL HIGH (ref 10–35)
Albumin/Globulin Ratio: 0.8 — ABNORMAL LOW (ref 1.1–2.2)
Albumin: 3.8 g/dL (ref 3.5–5.2)
Alk Phosphatase: 272 U/L — ABNORMAL HIGH (ref 35–104)
Anion Gap: 16 mmol/L — ABNORMAL HIGH (ref 2–14)
BUN/Creatinine Ratio: 7 — ABNORMAL LOW (ref 12–20)
BUN: 41 mg/dL — ABNORMAL HIGH (ref 6–20)
CO2: 29 mmol/L (ref 20–29)
Calcium: 9.2 mg/dL (ref 8.6–10.0)
Chloride: 94 mmol/L — ABNORMAL LOW (ref 98–107)
Creatinine: 5.77 mg/dL — ABNORMAL HIGH (ref 0.60–1.00)
Est, Glom Filt Rate: 9 ml/min/1.73m2 — ABNORMAL LOW (ref >59)
Globulin: 4.6 g/dL — ABNORMAL HIGH (ref 2.0–4.0)
Glucose: 190 mg/dL — ABNORMAL HIGH (ref 65–100)
Potassium: 3.8 mmol/L (ref 3.5–5.1)
Sodium: 139 mmol/L (ref 136–145)
Total Bilirubin: 0.5 mg/dL (ref 0.0–1.2)
Total Protein: 8.5 g/dL — ABNORMAL HIGH (ref 6.4–8.3)

## 2024-08-06 LAB — URINALYSIS WITH MICROSCOPIC
Bilirubin, Urine: NEGATIVE
Glucose, Ur: 500 mg/dL — AB
Ketones, Urine: NEGATIVE mg/dL
Leukocyte Esterase, Urine: NEGATIVE
Nitrite, Urine: NEGATIVE
Protein, UA: >300 mg/dL — AB
Specific Gravity, UA: 1.021 (ref 1.003–1.030)
Urobilinogen, Urine: 0.2 EU/dL (ref 0.2–1.0)
pH, Urine: 8 (ref 5.0–8.0)

## 2024-08-06 LAB — URINE CULTURE HOLD SAMPLE

## 2024-08-06 LAB — POC LACTIC ACID: POC Lactic Acid: 1.62 mmol/L (ref 0.40–2.00)

## 2024-08-06 LAB — EXTRA TUBES HOLD

## 2024-08-06 LAB — POC PREGNANCY UR-QUAL: Preg Test, Ur: NEGATIVE

## 2024-08-06 MED ORDER — HYDRALAZINE HCL 20 MG/ML IJ SOLN
20 | Freq: Once | INTRAMUSCULAR | Status: AC
Start: 2024-08-06 — End: 2024-08-06
  Administered 2024-08-06: 10 mg via INTRAVENOUS

## 2024-08-06 MED ORDER — SODIUM CHLORIDE 0.9 % IV BOLUS
0.9 | Freq: Once | INTRAVENOUS | Status: AC
Start: 2024-08-06 — End: 2024-08-06
  Administered 2024-08-06: 20:00:00 500 mL via INTRAVENOUS

## 2024-08-06 MED ORDER — DIAZEPAM 5 MG/ML IJ SOLN
5 | INTRAMUSCULAR | Status: DC | PRN
Start: 2024-08-06 — End: 2024-08-13
  Administered 2024-08-06 – 2024-08-07 (×3): 5 mg via INTRAVENOUS

## 2024-08-06 MED ORDER — MORPHINE SULFATE (PF) 4 MG/ML IJ SOLN
4 | Freq: Once | INTRAMUSCULAR | Status: AC
Start: 2024-08-06 — End: 2024-08-06
  Administered 2024-08-06: 21:00:00 4 mg via INTRAVENOUS

## 2024-08-06 MED ORDER — DROPERIDOL 2.5 MG/ML IJ SOLN
2.5 | Freq: Once | INTRAMUSCULAR | Status: DC
Start: 2024-08-06 — End: 2024-08-06

## 2024-08-06 MED ORDER — HYDRALAZINE HCL 20 MG/ML IJ SOLN
20 | Freq: Once | INTRAMUSCULAR | Status: AC
Start: 2024-08-06 — End: 2024-08-06
  Administered 2024-08-06: 22:00:00 10 mg via INTRAVENOUS

## 2024-08-06 MED FILL — HYDRALAZINE HCL 20 MG/ML IJ SOLN: 20 mg/mL | INTRAMUSCULAR | Qty: 1 | Fill #0

## 2024-08-06 MED FILL — DIAZEPAM 5 MG/ML IJ SOLN: 5 mg/mL | INTRAMUSCULAR | Qty: 2 | Fill #0

## 2024-08-06 MED FILL — MORPHINE SULFATE 4 MG/ML IJ SOLN: 4 mg/mL | INTRAMUSCULAR | Qty: 1 | Fill #0

## 2024-08-06 MED FILL — SODIUM CHLORIDE 0.9 % IV SOLN: 0.9 % | INTRAVENOUS | Qty: 500 | Fill #0

## 2024-08-06 NOTE — Significant Event (Addendum)
"  Pt's BP continues to be elevated  Re-ordered cardene  drip, will continue to monitor   Nephrology consulted and plan for HD today     Has baseline chronically elevated troponin  Will continue to trend, no chest pain or cardiac concern at this point  "

## 2024-08-06 NOTE — Consults (Signed)
"  Consult received for hemodialysis, ESRD MWF  -Dialyzes at [facility], access left AVG  -Missed HD today, K stable 3.8  -Orders entered, [facility] on-call nurse aware.    Full consult to follow in AM.    Rexene Castor, NP  [facility]  "

## 2024-08-06 NOTE — ED Triage Notes (Signed)
"  Patient arrives via EMS: c/o diffuse abdominal pain, N/V that began today.     Patient arrives from [facility] dialysis [facility]. Pt is MWF dialysis patient, and last had treatment on Friday. Pt did not have treatment today due to N/V.    Per EMS, patient's SBP was 210 in route and HR of 110.     Pt received 8 mg of zofran  in route, and 100 mcg of fentanyl  in route.     Pt is A&O x 4.  "

## 2024-08-06 NOTE — Discharge Instructions (Addendum)
 "    HOSPITALIST DISCHARGE INSTRUCTIONS  NAME:  Melinda [name]   DOB:  1981/04/22   MRN:  [id number]     Date/Time:  08/28/2024 11:04 AM    ADMIT DATE: 08/06/2024     DISCHARGE DATE: 08/28/2024     DISCHARGE DIAGNOSIS:  Vomiting, gastroparesis, hypertensive urgency    DISCHARGE INSTRUCTIONS:  Thank you for allowing us  to participate in your care. Your discharging Hospitalist is Othella Crock, MD. [name] were admitted for evaluation and treatment of the above.  You were changed to tube feeds and made NPO.  Your symptoms improved and you are well enough to be discharged now.  Your medications were changed, please take as prescribed.  Please follow-up with your primary care provider as well as your various specialist.  Contact details provided in this packet.      MEDICATIONS:    It is important that you take the medication exactly as they are prescribed.   Keep your medication in the bottles provided by the pharmacist and keep a list of the medication names, dosages, and times to be taken in your wallet.   Do not take other medications without consulting your doctor.             If you experience any of the following symptoms then please call your primary care physician or return to the emergency room if you cannot get hold of your doctor:  Fever, chills, nausea, vomiting, diarrhea, change in mentation, falling, bleeding, shortness of breath    Follow Up:  Please call the below provider to arrange hospital follow up appointment      [facility]  [address]  [place]  [phone]  [phone]  Schedule an appointment as soon as possible for a visit in 2 days      [facility] Emergency Department  [name]  [place]  [phone]  [phone]  Go to   If symptoms worsen    [facility]  [address]  [place]  [address]  [phone]  Schedule an appointment as soon as possible for a visit in 2 weeks  to establish  care with a PCP    Dale Rummer, Delon CROME, MD  [address] [place] [address]  [phone]    Go on 08/23/2024  New patient PCP appointment set for Thursday November 13th 2025 at 2:20 PM with Briscoe Dale Rummer at their [place] location.        Information obtained by :  I understand that if any problems occur once I am at home I am to contact my physician.    I understand and acknowledge receipt of the instructions indicated above.  Physician's or R.N.'s Signature                                                                  Date/Time                                                                                                                                              Patient or Representative Signature                                                          Date/Time    "

## 2024-08-06 NOTE — ED Notes (Signed)
 Bedside and Verbal shift change report given to [place] RN (oncoming nurse) by Adina PEAK (offgoing nurse). Report included the following information Nurse Handoff Report, ED SBAR, Adult Overview, MAR, Recent Results, and Neuro Assessment.

## 2024-08-06 NOTE — Significant Event (Signed)
"  Trop 122 from 124   Will stop trending   "

## 2024-08-06 NOTE — ED Provider Notes (Signed)
 "       [facility] EMERGENCY DEPARTMENT  EMERGENCY DEPARTMENT ENCOUNTER      Pt Name: Melinda [name]  MRN: [id number]  Birthdate [birth year]  Date of evaluation: 08/06/2024  Provider: Rosaline Tinnie Fila, DO    CHIEF COMPLAINT       Chief Complaint   Patient presents with    Abdominal Pain    Nausea & Vomiting         HISTORY OF PRESENT ILLNESS   (Location/Symptom, Timing/Onset, Context/Setting, Quality, Duration, Modifying Factors, Severity)  Note limiting factors.   Pt with PMHx of ESRD on MWF HD ([facility]), T2DM, HTN, and gastroparesis presents via EMS for intractable N/V. Symptoms started yesterday morning when she woke up, worsened today. Was unable to get dialysis this AM d/t vomiting. Has had episodes of N/V like this in the past, states it feels exactly the same. Associated with diffuse abdominal pain, worst in epigastric area, and back pain. Denies CP, SoB, fevers, chills, or changes in bowel habits. Denies blood in vomit or stool. Of note, pt rec'd 8mg  of Zofran  and 100mcg of Fentanyl  en route without symptom relief.       Nursing Notes were reviewed.    REVIEW OF SYSTEMS    (2-9 systems for level 4, 10 or more for level 5)     Review of Systems   Constitutional:  Positive for diaphoresis. Negative for chills and fever.   Respiratory:  Negative for chest tightness and shortness of breath.    Cardiovascular:  Negative for chest pain, palpitations and leg swelling.   Gastrointestinal:  Positive for abdominal pain, nausea and vomiting. Negative for blood in stool, constipation and diarrhea.   Musculoskeletal:  Positive for back pain.   All other systems reviewed and are negative.      Except as noted above the remainder of the review of systems was reviewed and negative.       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)     Gastroparesis     Gastroparesis     GERD (gastroesophageal  reflux disease)     High cholesterol     Hypertension          SURGICAL HISTORY       Past Surgical History:   Procedure Laterality Date    CAPSULE ENDOSCOPY N/A 01/20/2023    ESOPHAGEAL CAPSULE ENDOSCOPY remove at 1624PM performed by Madlyn Fendt, MD at [facility] ENDOSCOPY    CHOLECYSTECTOMY, LAPAROSCOPIC N/A 02/03/2023    ROBOTIC LAPAROSCOPIC CHOLECYSTECTOMY with Indocyanine green  performed by Chrystal Elijah BRAVO, MD at [facility] MAIN OR    COLONOSCOPY N/A 01/19/2023    COLONOSCOPY DIAGNOSTIC performed by Madlyn Fendt, MD at [facility] ENDOSCOPY    INVASIVE VASCULAR N/A 10/03/2023    Angiography visceral SMA performed by Millard Oneil LABOR, MD at [facility] CARDIAC CATH LAB    INVASIVE VASCULAR N/A 10/03/2023    Ultrasound guided vascular access performed by Millard Oneil LABOR, MD at [facility] CARDIAC CATH LAB    INVASIVE VASCULAR N/A 10/03/2023    Insert stent peripheral artery performed by Millard Oneil LABOR, MD at [facility] CARDIAC CATH LAB    IR NONTUNNELED VASCULAR CATHETER > 5 YEARS  10/10/2023    IR NONTUNNELED VASCULAR CATHETER > 5 YEARS 10/10/2023 [facility] CARDIAC CATH/EP/IR LAB    IR NONTUNNELED VASCULAR CATHETER >  5 YEARS  10/10/2023    IR NONTUNNELED VASCULAR CATHETER > 5 YEARS 10/10/2023 [facility] CARDIAC CATH/EP/IR LAB    OTHER SURGICAL HISTORY Left     Rentia attachment    TUBAL LIGATION Bilateral     UPPER GASTROINTESTINAL ENDOSCOPY N/A 01/17/2023    ESOPHAGOGASTRODUODENOSCOPY performed by Madlyn Fendt, MD at [facility] ENDOSCOPY    UPPER GASTROINTESTINAL ENDOSCOPY N/A 01/17/2023    ESOPHAGOGASTRODUODENOSCOPY BIOPSY performed by Madlyn Fendt, MD at [facility] ENDOSCOPY    UPPER GASTROINTESTINAL ENDOSCOPY N/A 01/18/2023    ESOPHAGOGASTRODUODENOSCOPY performed by Madlyn Fendt, MD at [facility] ENDOSCOPY    UPPER GASTROINTESTINAL ENDOSCOPY N/A 05/13/2023    ESOPHAGOGASTRODUODENOSCOPY performed by Roseann Lonni BIRCH, MD at [facility] ENDOSCOPY    UPPER GASTROINTESTINAL ENDOSCOPY N/A 05/13/2023    ESOPHAGOGASTRODUODENOSCOPY BIOPSY performed by Roseann Lonni BIRCH, MD at [facility] ENDOSCOPY    UPPER  GASTROINTESTINAL ENDOSCOPY N/A 09/21/2023    ESOPHAGOGASTRODUODENOSCOPY performed by Madlyn Fendt, MD at [facility] ENDOSCOPY    UPPER GASTROINTESTINAL ENDOSCOPY N/A 09/21/2023    ESOPHAGOGASTRODUODENOSCOPY BIOPSY performed by Madlyn Fendt, MD at [facility] ENDOSCOPY    US  FLUID COLLECTION DRAINAGE PERITONEAL/RETROPERITONEAL [facility]  02/11/2023    US  ABSCESS DRAINAGE PERITONEAL 02/11/2023 [facility] RAD US          CURRENT MEDICATIONS       Previous Medications    AMLODIPINE  (NORVASC ) 10 MG TABLET    Take 1 tablet by mouth daily    ASPIRIN  81 MG CHEWABLE TABLET    Take 1 tablet by mouth daily    ATORVASTATIN  (LIPITOR ) 80 MG TABLET    Take 1 tablet by mouth nightly    BUSPIRONE  (BUSPAR ) 15 MG TABLET    Take 15 mg by mouth 3 times daily    CARVEDILOL  (COREG ) 12.5 MG TABLET    Take 1 tablet by mouth 2 times daily (with meals)    CLOPIDOGREL  (PLAVIX ) 75 MG TABLET    Take 1 tablet by mouth daily    DULOXETINE  (CYMBALTA ) 60 MG EXTENDED RELEASE CAPSULE    Take 1 capsule by mouth daily    FAMOTIDINE  (PEPCID ) 20 MG TABLET    Take 1 tablet by mouth 2 times daily    HYDRALAZINE  (APRESOLINE ) 50 MG TABLET    Take 1 tablet by mouth 3 times daily    LIDOCAINE  4 % EXTERNAL PATCH    Place 1 patch onto the skin daily    METFORMIN  (GLUCOPHAGE -XR) 500 MG EXTENDED RELEASE TABLET    Take 2 tablets by mouth daily (with breakfast)    METOCLOPRAMIDE  (REGLAN ) 5 MG TABLET    Take 1 tablet by mouth 3 times daily as needed (nausea/vomiting)    ONDANSETRON  (ZOFRAN -ODT) 4 MG DISINTEGRATING TABLET    Take 1 tablet by mouth every 8 hours as needed for Nausea or Vomiting    PANTOPRAZOLE  (PROTONIX ) 40 MG TABLET    Take 1 tablet by mouth 2 times daily (before meals)    PROCHLORPERAZINE  (COMPAZINE ) 10 MG TABLET    Take 1 tablet by mouth every 6 hours as needed (nausea or vomiting)    SENNOSIDES-DOCUSATE SODIUM  (SENOKOT-S) 8.6-50 MG TABLET    Take 1 tablet by mouth in the morning and at bedtime    SEVELAMER  (RENVELA ) 0.8 G PACK PACKET    Take 1 packet by mouth 3 times daily (with  meals)    SODIUM BICARBONATE  650 MG TABLET    Take 1 tablet by mouth 3 times daily       ALLERGIES     Patient has  no known allergies.    FAMILY HISTORY     No family history on file.       SOCIAL HISTORY       Social History     Socioeconomic History    Marital status: Married   Tobacco Use    Smoking status: Never    Smokeless tobacco: Never   Vaping Use    Vaping status: Never Used   Substance and Sexual Activity    Alcohol use: Never    Drug use: Never    Sexual activity: Defer     Social Drivers of Health     Financial Resource Strain: Low Risk  (05/02/2024)    Received from [facility]    Overall Financial Resource Strain (CARDIA)     Difficulty of Paying Living Expenses: Not hard at all   Recent Concern: Financial Resource Strain - High Risk (04/02/2024)    Received from [facility]    Overall Financial Resource Strain (CARDIA)     Difficulty of Paying Living Expenses: Hard   Food Insecurity: No Food Insecurity (06/26/2024)    Hunger Vital Sign     Worried About Running Out of Food in the Last Year: Never true     Ran Out of Food in the Last Year: Never true   Recent Concern: Food Insecurity - Food Insecurity Present (04/02/2024)    Received from [facility]    Hunger Vital Sign     Within the past 12 months, you worried that your food would run out before you got the money to buy more.: Often true     Within the past 12 months, the food you bought just didn't last and you didn't have money to get more.: Often true   Transportation Needs: No Transportation Needs (06/26/2024)    PRAPARE - Therapist, Art (Medical): No     Lack of Transportation (Non-Medical): No   Recent Concern: Transportation Needs - Unmet Transportation Needs (05/02/2024)    Received from [facility] - Transportation     In the past 12 months, has lack of transportation kept you from medical appointments or from getting medications?: No      Lack of Transportation (Non-Medical): Yes   Housing Stability: Low Risk  (06/26/2024)    Housing Stability Vital Sign     Unable to Pay for Housing in the Last Year: No     Number of Times Moved in the Last Year: 0     Homeless in the Last Year: No           PHYSICAL EXAM    (up to 7 for level 4, 8 or more for level 5)     ED Triage Vitals   BP Girls Systolic BP Percentile Girls Diastolic BP Percentile Boys Systolic BP Percentile Boys Diastolic BP Percentile Temp Temp src Pulse   -- -- -- -- -- -- -- --      Resp SpO2 Height Weight       -- -- -- --           Body mass index is 24.31 kg/m.    Physical Exam  Constitutional:       General: She is in acute distress.      Appearance: She is ill-appearing.   Cardiovascular:      Rate and Rhythm: Regular rhythm. Tachycardia present.      Heart  sounds: No murmur heard.     No friction rub. No gallop.   Pulmonary:      Effort: Pulmonary effort is normal. No respiratory distress.      Breath sounds: No wheezing, rhonchi or rales.   Abdominal:      General: Abdomen is flat. Bowel sounds are normal.      Palpations: Abdomen is soft.      Tenderness: There is generalized abdominal tenderness and tenderness in the epigastric area. There is no guarding or rebound.   Skin:     General: Skin is warm and dry.   Neurological:      General: No focal deficit present.      Mental Status: She is alert.   Psychiatric:         Mood and Affect: Mood normal.         Behavior: Behavior normal.         DIAGNOSTIC RESULTS     EKG: All EKG's are interpreted by the Emergency Department Physician who either signs or Co-signs this chart in the absence of a cardiologist.    RADIOLOGY:   Non-plain film images such as CT, Ultrasound and MRI are read by the radiologist. Plain radiographic images are visualized and preliminarily interpreted by the emergency physician with the below findings:    Interpretation per the Radiologist below, if available at the time of this note:    CT ABDOMEN PELVIS WO  CONTRAST Additional Contrast? None   Final Result   No acute intraperitoneal process is identified.   Gastrojejunostomy.   Colonic diverticulosis with no diverticulitis.   Incidental and/or nonemergent findings are as described in detail above.         Electronically signed by JIMMY HABIB           LABS:  Labs Reviewed   CBC WITH AUTO DIFFERENTIAL - Abnormal; Notable for the following components:       Result Value    RDW 14.6 (*)     Platelets 405 (*)     Immature Granulocytes % 0.9 (*)     Eosinophils Absolute 0.41 (*)     Immature Granulocytes Absolute 0.09 (*)     All other components within normal limits   COMPREHENSIVE METABOLIC PANEL - Abnormal; Notable for the following components:    Chloride 94 (*)     Anion Gap 16 (*)     Glucose 190 (*)     BUN 41 (*)     Creatinine 5.77 (*)     BUN/Creatinine Ratio 7 (*)     Est, Glom Filt Rate 9 (*)     ALT 48 (*)     AST 42 (*)     Alk Phosphatase 272 (*)     Total Protein 8.5 (*)     Globulin 4.6 (*)     Albumin /Globulin Ratio 0.8 (*)     All other components within normal limits   URINALYSIS WITH MICROSCOPIC - Abnormal; Notable for the following components:    Protein, UA >300 (*)     Glucose, Ur 500 (*)     Blood, Urine TRACE (*)     BACTERIA, URINE 2+ (*)     All other components within normal limits   URINE CULTURE HOLD SAMPLE   LIPASE   EXTRA TUBES HOLD   POC LACTIC ACID   PREGNANCY, URINE   POC PREGNANCY UR-QUAL       All other labs were within normal range or not  returned as of this dictation.    EMERGENCY DEPARTMENT COURSE and DIFFERENTIAL DIAGNOSIS/MDM:   Vitals:    Vitals:    08/06/24 1800 08/06/24 1815 08/06/24 1845 08/06/24 1948   BP: (!) 210/84 (!) 195/82 (!) 221/93 (!) 246/133   Pulse: 97 92 (!) 101    Resp: 20 19 26     Temp:       TempSrc:       SpO2: 98% 98% 100%    Weight:       Height:               Medical Decision Making  43yo female presenting with intractable N/V. Differential dx includes, but not limited to: gastroparesis, bowel obstruction,  hyperemesis cannabinoid syndrome, gastroenteritis. Work-up in ED significant for nonischemic EKG w/ prolonged QTC. Normal lipase, no leukocytosis, stable transaminitis. Pt has G tube iso severe gastroparesis. CT abd/pelvis w/o acute abnormality, G tube in place, diverticulosis without diverticulitis. No indication for emergent dialysis at this time given normal potassium and no uremia. Was given prn hydralazine  x2 to treat elevated BPs, with only minimal improvement. Given Labetalol  x1 for BP, with continued elevations. Pt continued to experience intractable N/V, uncontrolled abdominal pain, and persistent elevation in BP. Recommend admission for further management of BP and intractable N/V, patient still unable to tolerate anything PO.       Perfect Serve Consult for Admission  8:41 PM    ED Room Number: ER17/17  Patient Name and age:  Addysin Porco Toto-Ambros 43 y.o.  female  Working Diagnosis: Hypertensive Emergency, Intractable Nausea and Vomiting    COVID-19 Suspicion: No  Sepsis present:  No,  Reassessment needed: No  Code Status:  Full Code  Readmission: No  Isolation Requirements: no  Recommended Level of Care: telemetry  Department: [facility] - [phone]    Other: ESRD pt on MWF dialysis, did not get dialysis this AM        Amount and/or Complexity of Data Reviewed  Labs: ordered. Decision-making details documented in ED Course.  Radiology: ordered.    Risk  Prescription drug management.  Decision regarding hospitalization.        REASSESSMENT     ED Course as of 08/06/24 2057   Mon Aug 06, 2024   1614 EKG shows sinus tachycardia rate of 107, prolonged QTC, left axis, no stemi [RD]   1621 POC Lactic Acid: 1.62  Lactic WNL [MS]   1635 WBC: 10.2  No leukocytosis [MS]   1657 Lipase: 30  Lipase WNL.  [MS]      ED Course User Index  [MS] Melonie Rosaline Maxwell, DO  [RD] Charma Lamar HERO, MD       CONSULTS:  None    PROCEDURES:  Unless otherwise noted below, none     Procedures      FINAL IMPRESSION       1. Hypertensive emergency    2. Intractable nausea and vomiting          DISPOSITION/PLAN   Admit for further management of hypertensive emergency and intractable nausea and vomiting.       PATIENT REFERRED TO:  [facility]  [address]  [place]  [id number]  [phone]  Schedule an appointment as soon as possible for a visit in 2 days      [facility] Department  [address] [place]  [place]   [address]  [phone]  Go to   If symptoms worsen  [facility]  [address]  [place]  [address]  [phone]  Schedule an appointment as soon as possible for a visit in 2 weeks  to establish care with a PCP      DISCHARGE MEDICATIONS:  New Prescriptions    No medications on file       Rosaline Tinnie Fila, DO   Resident Physician         "

## 2024-08-06 NOTE — ED Notes (Signed)
"  TRANSFER - OUT REPORT:    Verbal report given to Sarah RN on Melinda [name]  being transferred to 306 for routine progression of patient care       Report consisted of patient's Situation, Background, Assessment and   Recommendations(SBAR).     Information from the following report(s) Nurse Handoff Report, ED Encounter Summary, Adult Overview, Intake/Output, MAR, Recent Results, and Cardiac Rhythm   was reviewed with the receiving nurse.    Kinder Fall Assessment:    Presents to emergency department  because of falls (Syncope, seizure, or loss of consciousness): No  Age > 70: No  Altered Mental Status, Intoxication with alcohol or substance confusion (Disorientation, impaired judgment, poor safety awaremess, or inability to follow instructions): No  Impaired Mobility: Ambulates or transfers with assistive devices or assistance; Unable to ambulate or transer.: No             Lines:   Peripheral IV 08/06/24 Right Wrist (Active)   Site Assessment Clean, dry & intact 08/06/24 1644   Line Status Blood return noted 08/06/24 1644   Line Care Cap changed 08/06/24 1644   Phlebitis Assessment No symptoms 08/06/24 1644   Infiltration Assessment 0 08/06/24 1644   Dressing Status New dressing applied 08/06/24 1644   Dressing Type Transparent 08/06/24 1644        Opportunity for questions and clarification was provided.      Patient transported with:  Tech        "

## 2024-08-06 NOTE — Consults (Signed)
 Session ID: [id number]  Session Duration: 29 minutes  Language: Spanish  Interpreter ID: #[id number]  Interpreter Name: Shasta Conquest

## 2024-08-06 NOTE — H&P (Signed)
 "  Hospitalist Admission Note    NAME:  Melinda [name]   DOB:  May 09, 1981   MRN:  [id number]     Date/Time:  08/06/2024 9:04 PM    Patient PCP: No primary care provider on file.  ________________________________________________________________________    Given the patient's current clinical presentation, I have a high level of concern for decompensation if discharged from the emergency department.  Complex decision making was performed, which includes reviewing the patient's available past medical records, laboratory results, and x-ray films.       My assessment of this patient's clinical condition and my plan of care is as follows.    Assessment / Plan:    43 y.o. female with past medical history of ESRD on HD MWF, DM2, HTN, gastroparesis requiring JG tube who presented to emergency room with severe abdominal pain that started yesterday    # Diffuse abdominal pain  DD: Gastritis/worsening gastroparesis/PUD  CT abdomen pelvis: No acute intraperitoneal process is identified Gastrojejunostomy.Colonic diverticulosis with no diverticulitis.  Lipase 30  Physical exam soft abdomen with diffuse tenderness, no rebound tenderness no signs peritonitis  No leukocytosis, lactic acid negative less likely ischemic bowel  Plan  Bowel rest n.p.o. will sips of water   Symptomatic management, Reglan  with underlying gastroparesis  Consider GI consult if no improvement  Continue to monitor  Pain control  IV Protonix     # Hypertensive urgency POA  #Hx HTN  Blood pressure currently improved 162/93  Will continue patient's home Coreg /hydralazine /amlodipine   Continue vitals per protocol    # #ESRD  On HD   Nephrology consulted    # T2DM with gastroparesis. POA, chronic.  # S/p GJ tube- Required GJ tube placement for adequate nutrition.  A1C 6.1 on previous hospital admission  SSI  Nutrition consulted     #Mildly elevated LFTs  Compared to previous hospital admission LFTs have improved    I have personally reviewed the radiographs,  laboratory data in Epic and decisions and statements above are based partially on this personal interpretation.    Code Status: Full Code  DVT Prophylaxis: Lovenox   GI Prophylaxis: PPI       Subjective:   CHIEF COMPLAINT: Abdominal pain    HISTORY OF PRESENT ILLNESS:     Melinda [name] is a 43 y.o. female with past medical history of ESRD on HD MWF, DM2, HTN, gastroparesis requiring JG tube who presented to emergency room with severe abdominal pain that started yesterday    Patient reports he was in normal state of health until yesterday morning when she is woke up with severe diffuse abdominal pain mostly in the epigastric area, associated with nausea and severe vomiting, patient reports has not been able to keep anything down, she missed her dialysis secondary to vomiting  Has had episodes of N/V like this in the past, states it feels exactly the same.     Denies CP, SoB, fevers, chills, or changes in bowel habits.   Denies blood in vomit or stool.     In the ED after arrival to the emergency room patient was noted to be hypertensive with blood pressure 210/129, was given 1 dose of labetalol , that helped lower her blood pressure to 160/77 at time of admission  Labs with creatinine 5.77, lactic acid 1.6, no leukocytosis, UA negative with CT abdomen pelvis with no acute intraperitoneal process, colonic diverticulosis without diverticulitis  Patient is admitted for further management  Available records were reviewed at the time of H&P.        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)     Gastroparesis     Gastroparesis     GERD (gastroesophageal reflux disease)     High cholesterol     Hypertension       Past Surgical History:   Procedure Laterality Date    CAPSULE ENDOSCOPY N/A 01/20/2023    ESOPHAGEAL CAPSULE ENDOSCOPY remove at 1624PM performed by Madlyn Fendt, MD at [facility] ENDOSCOPY    CHOLECYSTECTOMY, LAPAROSCOPIC N/A 02/03/2023     ROBOTIC LAPAROSCOPIC CHOLECYSTECTOMY with Indocyanine green  performed by Chrystal Elijah BRAVO, MD at [facility] MAIN OR    COLONOSCOPY N/A 01/19/2023    COLONOSCOPY DIAGNOSTIC performed by Madlyn Fendt, MD at [facility] ENDOSCOPY    INVASIVE VASCULAR N/A 10/03/2023    Angiography visceral SMA performed by Millard Oneil LABOR, MD at [facility] CARDIAC CATH LAB    INVASIVE VASCULAR N/A 10/03/2023    Ultrasound guided vascular access performed by Millard Oneil LABOR, MD at [facility] CARDIAC CATH LAB    INVASIVE VASCULAR N/A 10/03/2023    Insert stent peripheral artery performed by Millard Oneil LABOR, MD at [facility] CARDIAC CATH LAB    IR NONTUNNELED VASCULAR CATHETER > 5 YEARS  10/10/2023    IR NONTUNNELED VASCULAR CATHETER > 5 YEARS 10/10/2023 [facility] CARDIAC CATH/EP/IR LAB    IR NONTUNNELED VASCULAR CATHETER > 5 YEARS  10/10/2023    IR NONTUNNELED VASCULAR CATHETER > 5 YEARS 10/10/2023 [facility] CARDIAC CATH/EP/IR LAB    OTHER SURGICAL HISTORY Left     Rentia attachment    TUBAL LIGATION Bilateral     UPPER GASTROINTESTINAL ENDOSCOPY N/A 01/17/2023    ESOPHAGOGASTRODUODENOSCOPY performed by Madlyn Fendt, MD at [facility] ENDOSCOPY    UPPER GASTROINTESTINAL ENDOSCOPY N/A 01/17/2023    ESOPHAGOGASTRODUODENOSCOPY BIOPSY performed by Madlyn Fendt, MD at [facility] ENDOSCOPY    UPPER GASTROINTESTINAL ENDOSCOPY N/A 01/18/2023    ESOPHAGOGASTRODUODENOSCOPY performed by Madlyn Fendt, MD at [facility] ENDOSCOPY    UPPER GASTROINTESTINAL ENDOSCOPY N/A 05/13/2023    ESOPHAGOGASTRODUODENOSCOPY performed by Roseann Lonni BIRCH, MD at [facility] ENDOSCOPY    UPPER GASTROINTESTINAL ENDOSCOPY N/A 05/13/2023    ESOPHAGOGASTRODUODENOSCOPY BIOPSY performed by Roseann Lonni BIRCH, MD at [facility] ENDOSCOPY    UPPER GASTROINTESTINAL ENDOSCOPY N/A 09/21/2023    ESOPHAGOGASTRODUODENOSCOPY performed by Madlyn Fendt, MD at [facility] ENDOSCOPY    UPPER GASTROINTESTINAL ENDOSCOPY N/A 09/21/2023    ESOPHAGOGASTRODUODENOSCOPY BIOPSY performed by Madlyn Fendt, MD at [facility] ENDOSCOPY    US  FLUID COLLECTION DRAINAGE PERITONEAL/RETROPERITONEAL [facility]   02/11/2023    US  ABSCESS DRAINAGE PERITONEAL 02/11/2023 [facility] RAD US      Social History     Tobacco Use    Smoking status: Never    Smokeless tobacco: Never   Substance Use Topics    Alcohol use: Never      No family history on file.     No Known Allergies     Prior to Admission medications   Medication Sig Start Date End Date Taking? Authorizing Provider   metoclopramide  (REGLAN ) 5 MG tablet Take 1 tablet by mouth 3 times daily as needed (nausea/vomiting) 07/20/24 07/25/24  Cutchins, Abigail M, PA-C   lidocaine  4 % external patch Place 1 patch onto the skin daily 07/02/24   Karenann Barter, MD   sevelamer  (RENVELA ) 0.8 g PACK packet Take 1 packet by mouth 3  times daily (with meals) 07/02/24   Karenann Barter, MD   atorvastatin  (LIPITOR ) 80 MG tablet Take 1 tablet by mouth nightly    [provider]   carvedilol  (COREG ) 12.5 MG tablet Take 1 tablet by mouth 2 times daily (with meals)    [provider]   famotidine  (PEPCID ) 20 MG tablet Take 1 tablet by mouth 2 times daily    [provider]   hydrALAZINE  (APRESOLINE ) 50 MG tablet Take 1 tablet by mouth 3 times daily    [provider]   sodium bicarbonate  650 MG tablet Take 1 tablet by mouth 3 times daily 02/23/24   Muncy, Manuelita Bohr, MD   pantoprazole  (PROTONIX ) 40 MG tablet Take 1 tablet by mouth 2 times daily (before meals) 02/23/24   Muncy, Manuelita Bohr, MD   sennosides-docusate sodium  (SENOKOT-S) 8.6-50 MG tablet Take 1 tablet by mouth in the morning and at bedtime 11/06/23   Do, Khoi B, MD   ondansetron  (ZOFRAN -ODT) 4 MG disintegrating tablet Take 1 tablet by mouth every 8 hours as needed for Nausea or Vomiting 11/06/23   Do, Rodrick NOVAK, MD   prochlorperazine  (COMPAZINE ) 10 MG tablet Take 1 tablet by mouth every 6 hours as needed (nausea or vomiting) 10/25/23   Orson Planas, MD   metFORMIN  (GLUCOPHAGE -XR) 500 MG extended release tablet Take 2 tablets by mouth daily (with breakfast)  Patient not taking: Reported on 06/26/2024 10/25/23    Orson Planas, MD   busPIRone  (BUSPAR ) 15 MG tablet Take 15 mg by mouth 3 times daily  Patient taking differently: Take 5 mg by mouth in the morning and at bedtime 10/14/23   Tefera, Mesfin A, MD   DULoxetine  (CYMBALTA ) 60 MG extended release capsule Take 1 capsule by mouth daily 10/15/23   Tefera, Mesfin A, MD   clopidogrel  (PLAVIX ) 75 MG tablet Take 1 tablet by mouth daily 10/15/23   Mikie, Mesfin A, MD   aspirin  81 MG chewable tablet Take 1 tablet by mouth daily 10/15/23   Tefera, Mesfin A, MD   amLODIPine  (NORVASC ) 10 MG tablet Take 1 tablet by mouth daily 03/23/23   Do, Khoi B, MD       REVIEW OF SYSTEMS:  See HPI for details        Objective:   VITALS:    Vitals:    08/06/24 1948   BP: (!) 246/133   Pulse:    Resp:    Temp:    SpO2:      PHYSICAL EXAM:    Physical Exam:    Gen: Chronically ill looking  HEENT:  Pink conjunctivae, PERRL, hearing intact to voice, moist mucous membranes  Neck:  Supple, without masses, thyroid non-tender  Resp:  No accessory muscle use, clear breath sounds without wheezes rales or rhonchi  Card:  No murmurs, normal S1, S2 without thrills, bruits or peripheral edema  Abd:  Soft, diffuse tenderness no point tenderness, non-distended, normoactive bowel sounds are present with G-tube in place  Musc:  No cyanosis or clubbing  Skin:  No rashes or ulcers, skin turgor is good  Neuro:  Cranial nerves 3-12 are grossly intact, grip strength is 5/5 bilaterally and dorsi / plantarflexion is 5/5 bilaterally, follows commands appropriately  Psych:  Good insight, oriented to person, place and time, alert         _______________________________________________________________________  Care Plan discussed with:    Comments   Patient x Discussed with patient in room. POC outlined and Questions answered  Family      RN x    Care Manager                    Consultant:  x ED MD   _______________________________________________________________________  Recommended Disposition:   Home with Family x   HH/PT/OT/RN     SNF/LTC    SA[facility]    ________________________________________________________________________  TOTAL TIME:  40 Minutes        Comments   >50% of visit spent in counseling and coordination of care x Chart reviewed  Discussion with patient and/or family and questions answered     ________________________________________________________________________  Signed: Prentice Arnold, MD    Portions of this note were completed with Dragon, the computer voice recognition software.  Quite often unanticipated grammatical, syntax, homophones, and other interpretive errors are inadvertently transcribed by the computer software.  Please disregard these errors.  Efforts were made to edit the dictations but occasionally words are mis-transcribed.     Procedures: see electronic medical records for all procedures/Xrays/labs and details which were not copied into this note but were reviewed prior to creation of Plan.  "

## 2024-08-06 NOTE — ED Notes (Signed)
"  Dr. Melonie notified of patient's continued HTN, with BP: 236/96.  "

## 2024-08-07 LAB — COMPREHENSIVE METABOLIC PANEL
ALT: 32 U/L (ref 10–35)
AST: 31 U/L (ref 10–35)
Albumin/Globulin Ratio: 0.9 — ABNORMAL LOW (ref 1.1–2.2)
Albumin: 3.5 g/dL (ref 3.5–5.2)
Alk Phosphatase: 230 U/L — ABNORMAL HIGH (ref 35–104)
Anion Gap: 12 mmol/L (ref 2–14)
BUN/Creatinine Ratio: 4 — ABNORMAL LOW (ref 12–20)
BUN: 14 mg/dL (ref 6–20)
CO2: 30 mmol/L — ABNORMAL HIGH (ref 20–29)
Calcium: 8.8 mg/dL (ref 8.6–10.0)
Chloride: 97 mmol/L — ABNORMAL LOW (ref 98–107)
Creatinine: 3.34 mg/dL — ABNORMAL HIGH (ref 0.60–1.00)
Est, Glom Filt Rate: 17 ml/min/1.73m2 — ABNORMAL LOW (ref >59)
Globulin: 4.1 g/dL — ABNORMAL HIGH (ref 2.0–4.0)
Glucose: 137 mg/dL — ABNORMAL HIGH (ref 65–100)
Potassium: 4.1 mmol/L (ref 3.5–5.1)
Sodium: 139 mmol/L (ref 136–145)
Total Bilirubin: 0.6 mg/dL (ref 0.0–1.2)
Total Protein: 7.6 g/dL (ref 6.4–8.3)

## 2024-08-07 LAB — CBC WITH AUTO DIFFERENTIAL
Basophils %: 0.8 % (ref 0.0–1.0)
Basophils Absolute: 0.07 K/UL (ref 0.00–0.10)
Eosinophils %: 1.8 % (ref 0.0–7.0)
Eosinophils Absolute: 0.15 K/UL (ref 0.00–0.40)
Hematocrit: 34.7 % — ABNORMAL LOW (ref 35.0–47.0)
Hemoglobin: 11.5 g/dL (ref 11.5–16.0)
Immature Granulocytes %: 0.4 % (ref 0.0–0.5)
Immature Granulocytes Absolute: 0.03 K/UL (ref 0.00–0.04)
Lymphocytes %: 17.3 % (ref 12.0–49.0)
Lymphocytes Absolute: 1.46 K/UL (ref 0.80–3.50)
MCH: 31 pg (ref 26.0–34.0)
MCHC: 33.1 g/dL (ref 30.0–36.5)
MCV: 93.5 FL (ref 80.0–99.0)
MPV: 8.6 FL — ABNORMAL LOW (ref 8.9–12.9)
Monocytes %: 9.1 % (ref 5.0–13.0)
Monocytes Absolute: 0.77 K/UL (ref 0.00–1.00)
Neutrophils %: 70.6 % (ref 32.0–75.0)
Neutrophils Absolute: 5.96 K/UL (ref 1.80–8.00)
Nucleated RBCs: 0 /100{WBCs}
Platelets: 307 K/uL (ref 150–400)
RBC: 3.71 M/uL — ABNORMAL LOW (ref 3.80–5.20)
RDW: 14.7 % — ABNORMAL HIGH (ref 11.5–14.5)
WBC: 8.4 K/uL (ref 3.6–11.0)
nRBC: 0 K/uL (ref 0.00–0.01)

## 2024-08-07 LAB — EKG 12-LEAD
Atrial Rate: 107 {beats}/min
Atrial Rate: 97 {beats}/min
Diagnosis: NORMAL
P Axis: 62 degrees
P Axis: 62 degrees
P-R Interval: 140 ms
P-R Interval: 150 ms
Q-T Interval: 390 ms
Q-T Interval: 388 ms
QRS Duration: 86 ms
QRS Duration: 88 ms
QTc Calculation (Bazett): 520 ms
QTc Calculation (Bazett): 492 ms
R Axis: -55 degrees
R Axis: -56 degrees
T Axis: 57 degrees
T Axis: 106 degrees
Ventricular Rate: 107 {beats}/min
Ventricular Rate: 97 {beats}/min

## 2024-08-07 LAB — HEPATITIS B SURFACE ANTIGEN: Hepatitis B Surface Ag: NONREACTIVE

## 2024-08-07 LAB — LIPID PANEL
Chol/HDL Ratio: 2.4 (ref 0.0–5.0)
Cholesterol, Total: 192 mg/dL (ref 0–200)
HDL: 80 mg/dL — ABNORMAL HIGH (ref 40–60)
LDL Cholesterol: 78 mg/dL (ref 0–100)
Triglycerides: 171 mg/dL — ABNORMAL HIGH (ref 0–150)
VLDL Cholesterol Calculated: 34 mg/dL

## 2024-08-07 LAB — POCT GLUCOSE
POC Glucose: 197 mg/dL — ABNORMAL HIGH (ref 65–117)
POC Glucose: 147 mg/dL — ABNORMAL HIGH (ref 65–117)
POC Glucose: 200 mg/dL — ABNORMAL HIGH (ref 65–117)

## 2024-08-07 LAB — TROPONIN
Troponin T: 124 ng/L (ref 0–14)
Troponin T: 122 ng/L (ref 0–14)

## 2024-08-07 LAB — HEPATITIS B SURFACE ANTIBODY: Hep B S Ab: <3.5 m[IU]/mL

## 2024-08-07 LAB — TSH: TSH, 3rd Generation: 5.17 u[IU]/mL — ABNORMAL HIGH (ref 0.270–4.200)

## 2024-08-07 MED ORDER — ALBUMIN HUMAN 25 % IV SOLN
25 | INTRAVENOUS | Status: DC | PRN
Start: 2024-08-07 — End: 2024-08-08
  Administered 2024-08-07 – 2024-08-08 (×2): 12.5 g via INTRAVENOUS

## 2024-08-07 MED ORDER — ASPIRIN 81 MG PO CHEW
81 | Freq: Every day | ORAL | Status: DC
Start: 2024-08-07 — End: 2024-08-15
  Administered 2024-08-08 – 2024-08-15 (×7): 81 mg via ORAL

## 2024-08-07 MED ORDER — METOCLOPRAMIDE HCL 5 MG/ML IJ SOLN
5 | Freq: Four times a day (QID) | INTRAMUSCULAR | Status: DC | PRN
Start: 2024-08-07 — End: 2024-08-08
  Administered 2024-08-07 – 2024-08-08 (×5): 5 mg via INTRAVENOUS

## 2024-08-07 MED ORDER — ACETAMINOPHEN 325 MG PO TABS
325 | Freq: Four times a day (QID) | ORAL | Status: DC | PRN
Start: 2024-08-07 — End: 2024-08-17

## 2024-08-07 MED ORDER — ACETAMINOPHEN 650 MG RE SUPP
650 | Freq: Four times a day (QID) | RECTAL | Status: DC | PRN
Start: 2024-08-07 — End: 2024-08-17

## 2024-08-07 MED ORDER — FAMOTIDINE 20 MG PO TABS
20 | Freq: Every day | ORAL | Status: DC
Start: 2024-08-07 — End: 2024-08-15
  Administered 2024-08-08 – 2024-08-15 (×7): 20 mg via ORAL

## 2024-08-07 MED ORDER — OXYCODONE HCL 5 MG PO TABS
5 | ORAL | Status: DC | PRN
Start: 2024-08-07 — End: 2024-08-18
  Administered 2024-08-16: 06:00:00 5 mg via ORAL

## 2024-08-07 MED ORDER — MORPHINE SULFATE (PF) 4 MG/ML IJ SOLN
4 | INTRAMUSCULAR | Status: DC | PRN
Start: 2024-08-07 — End: 2024-08-17
  Administered 2024-08-07 – 2024-08-17 (×20): 2 mg via INTRAVENOUS

## 2024-08-07 MED ORDER — INSULIN LISPRO 100 UNIT/ML IJ SOLN
100 | Freq: Four times a day (QID) | INTRAMUSCULAR | Status: DC
Start: 2024-08-07 — End: 2024-08-24
  Administered 2024-08-07 – 2024-08-10 (×4): 2 [IU] via SUBCUTANEOUS
  Administered 2024-08-16: 21:00:00 6 [IU] via SUBCUTANEOUS
  Administered 2024-08-17: 03:00:00 4 [IU] via SUBCUTANEOUS
  Administered 2024-08-18 – 2024-08-19 (×3): 2 [IU] via SUBCUTANEOUS
  Administered 2024-08-19: 03:00:00 4 [IU] via SUBCUTANEOUS
  Administered 2024-08-20 – 2024-08-21 (×5): 2 [IU] via SUBCUTANEOUS
  Administered 2024-08-22 (×2): 4 [IU] via SUBCUTANEOUS
  Administered 2024-08-22 – 2024-08-24 (×3): 2 [IU] via SUBCUTANEOUS

## 2024-08-07 MED ORDER — ONDANSETRON HCL 4 MG/2ML IJ SOLN
4 | Freq: Four times a day (QID) | INTRAMUSCULAR | Status: DC | PRN
Start: 2024-08-07 — End: 2024-08-08
  Administered 2024-08-07 – 2024-08-08 (×4): 4 mg via INTRAVENOUS

## 2024-08-07 MED ORDER — SEVELAMER CARBONATE 0.8 G PO PACK
0.8 | Freq: Three times a day (TID) | ORAL | Status: DC
Start: 2024-08-07 — End: 2024-08-18
  Administered 2024-08-10 – 2024-08-18 (×16): 0.8 g via ORAL

## 2024-08-07 MED ORDER — NICARDIPINE HCL 2.5 MG/ML IV SOLN
2.5 | INTRAVENOUS | Status: DC
Start: 2024-08-07 — End: 2024-08-06
  Administered 2024-08-07: 03:00:00 5 mg/h via INTRAVENOUS

## 2024-08-07 MED ORDER — ATORVASTATIN CALCIUM 20 MG PO TABS
20 | Freq: Every evening | ORAL | Status: DC
Start: 2024-08-07 — End: 2024-08-15
  Administered 2024-08-07 – 2024-08-15 (×8): 80 mg via ORAL

## 2024-08-07 MED ORDER — BUSPIRONE HCL 10 MG PO TABS
10 | Freq: Two times a day (BID) | ORAL | Status: DC
Start: 2024-08-07 — End: 2024-08-15
  Administered 2024-08-08 – 2024-08-15 (×14): 5 mg via ORAL

## 2024-08-07 MED ORDER — SODIUM CHLORIDE 0.9 % IV SOLN
0.9 | INTRAVENOUS | Status: DC
Start: 2024-08-07 — End: 2024-08-07
  Administered 2024-08-07: 02:00:00 via INTRAVENOUS

## 2024-08-07 MED ORDER — HEPARIN SODIUM (PORCINE) 5000 UNIT/ML IJ SOLN
5000 | Freq: Three times a day (TID) | INTRAMUSCULAR | Status: DC
Start: 2024-08-07 — End: 2024-08-28
  Administered 2024-08-07 – 2024-08-27 (×55): 5000 [IU] via SUBCUTANEOUS

## 2024-08-07 MED ORDER — NICARDIPINE HCL 2.5 MG/ML IV SOLN
2.5 | INTRAVENOUS | Status: DC
Start: 2024-08-07 — End: 2024-08-06

## 2024-08-07 MED ORDER — PANTOPRAZOLE SODIUM 40 MG PO TBEC
40 | Freq: Two times a day (BID) | ORAL | Status: DC
Start: 2024-08-07 — End: 2024-08-06

## 2024-08-07 MED ORDER — POLYETHYLENE GLYCOL 3350 17 G PO PACK
17 | Freq: Every day | ORAL | Status: DC | PRN
Start: 2024-08-07 — End: 2024-08-18
  Administered 2024-08-18: 04:00:00 17 g via ORAL

## 2024-08-07 MED ORDER — DIPHENHYDRAMINE HCL 50 MG/ML IJ SOLN
50 | Freq: Three times a day (TID) | INTRAMUSCULAR | Status: DC | PRN
Start: 2024-08-07 — End: 2024-08-13
  Administered 2024-08-08 – 2024-08-09 (×2): 12.5 mg via INTRAVENOUS

## 2024-08-07 MED ORDER — PANTOPRAZOLE SODIUM 40 MG IV SOLR
40 | Freq: Every day | INTRAVENOUS | Status: DC
Start: 2024-08-07 — End: 2024-08-13
  Administered 2024-08-07 – 2024-08-13 (×7): 40 mg via INTRAVENOUS

## 2024-08-07 MED ORDER — SODIUM CHLORIDE 0.9 % IV SOLN
0.9 | INTRAVENOUS | Status: DC
Start: 2024-08-07 — End: 2024-08-13
  Administered 2024-08-07 – 2024-08-13 (×9): via INTRAVENOUS

## 2024-08-07 MED ORDER — SODIUM BICARBONATE 650 MG PO TABS
650 | Freq: Three times a day (TID) | ORAL | Status: DC
Start: 2024-08-07 — End: 2024-08-18
  Administered 2024-08-07 – 2024-08-18 (×27): 650 mg via ORAL

## 2024-08-07 MED ORDER — NORMAL SALINE FLUSH 0.9 % IV SOLN
0.9 | Freq: Two times a day (BID) | INTRAVENOUS | Status: DC
Start: 2024-08-07 — End: 2024-08-16
  Administered 2024-08-07 – 2024-08-15 (×15): 10 mL via INTRAVENOUS
  Administered 2024-08-16: 02:00:00 20 mL via INTRAVENOUS

## 2024-08-07 MED ORDER — SODIUM CHLORIDE 0.9 % IV SOLN
0.9 | INTRAVENOUS | Status: DC | PRN
Start: 2024-08-07 — End: 2024-08-28

## 2024-08-07 MED ORDER — MORPHINE SULFATE (PF) 4 MG/ML IJ SOLN
4 | INTRAMUSCULAR | Status: DC | PRN
Start: 2024-08-07 — End: 2024-08-13
  Administered 2024-08-07 – 2024-08-13 (×26): 4 mg via INTRAVENOUS

## 2024-08-07 MED ORDER — CARVEDILOL 12.5 MG PO TABS
12.5 | Freq: Two times a day (BID) | ORAL | Status: DC
Start: 2024-08-07 — End: 2024-08-15
  Administered 2024-08-09 – 2024-08-14 (×9): 12.5 mg via ORAL

## 2024-08-07 MED ORDER — LABETALOL HCL 5 MG/ML IV SOLN
5 | INTRAVENOUS | Status: DC
Start: 2024-08-07 — End: 2024-08-07
  Administered 2024-08-07: 20 mg via INTRAVENOUS

## 2024-08-07 MED ORDER — FAMOTIDINE 20 MG PO TABS
20 | Freq: Two times a day (BID) | ORAL | Status: DC
Start: 2024-08-07 — End: 2024-08-07
  Administered 2024-08-07: 02:00:00 20 mg via ORAL

## 2024-08-07 MED ORDER — HYDRALAZINE HCL 25 MG PO TABS
25 | Freq: Three times a day (TID) | ORAL | Status: DC
Start: 2024-08-07 — End: 2024-08-15
  Administered 2024-08-07 – 2024-08-15 (×19): 50 mg via ORAL

## 2024-08-07 MED ORDER — AMLODIPINE BESYLATE 5 MG PO TABS
5 | Freq: Every day | ORAL | Status: DC
Start: 2024-08-07 — End: 2024-08-15
  Administered 2024-08-08 – 2024-08-15 (×5): 10 mg via ORAL

## 2024-08-07 MED ORDER — OXYCODONE HCL 10 MG PO TABS
10 | ORAL | Status: DC | PRN
Start: 2024-08-07 — End: 2024-08-17
  Administered 2024-08-11 – 2024-08-16 (×7): 10 mg via ORAL

## 2024-08-07 MED ORDER — SENNA-DOCUSATE SODIUM 8.6-50 MG PO TABS
8.6-50 | Freq: Two times a day (BID) | ORAL | Status: DC
Start: 2024-08-07 — End: 2024-08-15
  Administered 2024-08-07 – 2024-08-15 (×15): 1 via ORAL

## 2024-08-07 MED ORDER — METOCLOPRAMIDE HCL 5 MG/ML IJ SOLN
5 | Freq: Four times a day (QID) | INTRAMUSCULAR | Status: DC | PRN
Start: 2024-08-07 — End: 2024-08-06

## 2024-08-07 MED ORDER — ONDANSETRON 4 MG PO TBDP
4 | Freq: Three times a day (TID) | ORAL | Status: DC | PRN
Start: 2024-08-07 — End: 2024-08-08

## 2024-08-07 MED ORDER — SODIUM CHLORIDE 0.9 % IV SOLN
0.9 | Freq: Three times a day (TID) | INTRAVENOUS | Status: DC | PRN
Start: 2024-08-07 — End: 2024-08-07

## 2024-08-07 MED ORDER — DULOXETINE HCL 30 MG PO CPEP
30 | Freq: Every day | ORAL | Status: DC
Start: 2024-08-07 — End: 2024-08-15
  Administered 2024-08-08 – 2024-08-15 (×8): 60 mg via ORAL

## 2024-08-07 MED ORDER — NORMAL SALINE FLUSH 0.9 % IV SOLN
0.9 | INTRAVENOUS | Status: DC | PRN
Start: 2024-08-07 — End: 2024-08-16

## 2024-08-07 MED ORDER — CLOPIDOGREL BISULFATE 75 MG PO TABS
75 | Freq: Every day | ORAL | Status: DC
Start: 2024-08-07 — End: 2024-08-15
  Administered 2024-08-08 – 2024-08-15 (×7): 75 mg via ORAL

## 2024-08-07 MED FILL — HEPARIN SODIUM (PORCINE) 5000 UNIT/ML IJ SOLN: 5000 [IU]/mL | INTRAMUSCULAR | Qty: 1 | Fill #0

## 2024-08-07 MED FILL — INSULIN LISPRO 100 UNIT/ML IJ SOLN: 100 [IU]/mL | INTRAMUSCULAR | Qty: 1 | Fill #0

## 2024-08-07 MED FILL — PANTOPRAZOLE SODIUM 40 MG IV SOLR: 40 mg | INTRAVENOUS | Qty: 40 | Fill #0

## 2024-08-07 MED FILL — ALBUMIN HUMAN 25 % IV SOLN: 25 % | INTRAVENOUS | Qty: 50 | Fill #0

## 2024-08-07 MED FILL — HYDRALAZINE HCL 25 MG PO TABS: 25 mg | ORAL | Qty: 2 | Fill #0

## 2024-08-07 MED FILL — SODIUM BICARBONATE 650 MG PO TABS: 650 mg | ORAL | Qty: 1 | Fill #0

## 2024-08-07 MED FILL — SODIUM CHLORIDE 0.9 % IV SOLN: 0.9 % | INTRAVENOUS | Qty: 1000 | Fill #0

## 2024-08-07 MED FILL — ONDANSETRON HCL 4 MG/2ML IJ SOLN: 4 MG/2ML | INTRAMUSCULAR | Qty: 2 | Fill #0

## 2024-08-07 MED FILL — BD POSIFLUSH 0.9 % IV SOLN: 0.9 % | INTRAVENOUS | Qty: 40 | Fill #0

## 2024-08-07 MED FILL — MORPHINE SULFATE 4 MG/ML IJ SOLN: 4 mg/mL | INTRAMUSCULAR | Qty: 1 | Fill #0

## 2024-08-07 MED FILL — SENNOSIDES-DOCUSATE SODIUM 8.6-50 MG PO TABS: 8.6-50 mg | ORAL | Qty: 1 | Fill #0

## 2024-08-07 MED FILL — ATORVASTATIN CALCIUM 20 MG PO TABS: 20 mg | ORAL | Qty: 4 | Fill #0

## 2024-08-07 MED FILL — DIAZEPAM 5 MG/ML IJ SOLN: 5 mg/mL | INTRAMUSCULAR | Qty: 2 | Fill #0

## 2024-08-07 MED FILL — SEVELAMER CARBONATE 0.8 G PO PACK: 0.8 g | ORAL | Qty: 1 | Fill #0

## 2024-08-07 MED FILL — SODIUM CHLORIDE 0.9 % IV SOLN: 0.9 % | INTRAVENOUS | Qty: 250 | Fill #0

## 2024-08-07 MED FILL — FAMOTIDINE 20 MG PO TABS: 20 mg | ORAL | Qty: 1 | Fill #0

## 2024-08-07 MED FILL — INSULIN LISPRO 100 UNIT/ML IJ SOLN: 100 [IU]/mL | INTRAMUSCULAR | Qty: 2 | Fill #0

## 2024-08-07 MED FILL — LABETALOL HCL 5 MG/ML IV SOLN: 5 mg/mL | INTRAVENOUS | Qty: 4 | Fill #0

## 2024-08-07 MED FILL — METOCLOPRAMIDE HCL 5 MG/ML IJ SOLN: 5 mg/mL | INTRAMUSCULAR | Qty: 2 | Fill #0

## 2024-08-07 NOTE — Flowsheet Note (Signed)
 "PRE DIALYSIS   08/07/24 0115   Treatment   Treatment Goal 2-3 L in 3hrs   Observations & Evaluations   Level of Consciousness 0   Heart Rhythm Regular   Respiratory Quality/Effort Dyspnea with exertion   O2 Device None (Room air)   Bilateral Breath Sounds Diminished   Skin Condition/Temp Dry;Warm   Abdomen Inspection Soft;Flat   Edema None   Vital Signs   BP 132/73   Temp 97.9 F (36.6 C)   Pulse 81   Respirations 20   SpO2 96 %   Pain Assessment   Pain Assessment 0-10   Pain Level 10   Pain Location Abdomen   Technical Checks   Dialysis Machine No. 01   RO Machine Number [id number]   Dialyzer Lot No. [id number]   Tubing Lot Number [id number]   All Connections Secure Yes   NS Bag Yes   Saline Line Double Clamped Yes   Dialyzer F-180   Prime Volume (mL) 250 mL   ICEBOAT I;C;E;B;O;A;T   RO Machine Log Sheet Completed Yes   Machine Alarm Self Test Completed;Passed   Child Psychotherapist Function;pH Reading   Sport And Exercise Psychologist Conductivity 13.8   Manual Ph 7.2   Bleach Test (Neg) Yes   Bath Temperature 98.6 F (37 C)   Hemodialysis Fistula/Graft Arteriovenous fistula Left Arm   No placement date or time found.   Present on Admission/Arrival: Yes  Access Type: Arteriovenous fistula  Orientation: Left  Access Location: Arm   Site Assessment Clean, dry & intact   Thrill Present   Bruit Present   Status Accessed   Venous Needle Size 15 G   Arterial Needle Size 15 G   Accessed By: E. Kingston, RN   Access Attempts  1   Access Interventions Chlorhexidine;Aseptic Technique;Needles taped to patient   Dialysis Bath   K+ (Potassium) 4   Ca+ (Calcium ) 2.5   Na+ (Sodium) 138   HCO3 (Bicarb) 38   Bicarbonate Concentrate Lot No. [id number]   Acid Concentrate Lot No. [id number]   Handoff   Handoff Given To E. Kingston, RN   Handoff Received From S. Blondie, RN   Handoff Communication Face to Face   Time Handoff Given 0030     Primary RN SBAR: CANDIE Blondie, RN  Incapacitated Nurse edu. provided:  yes  Patient Education provided: procedural  Preferred Education method and Primary language: verbal, English  Dialysis consent: chronics consent applies  Hospital General Consent Verified: yes  Hospital associated wait time; reason: n/a  Hepatitis B Surface Ag   Date/Time Value Ref Range Status   06/27/2024 01:36 PM NONREACTIVE NR   Final     Hep B S Ag Interp   Date/Time Value Ref Range Status   03/16/2023 08:19 AM Negative NEG   Final     Hep B S Ab   Date/Time Value Ref Range Status   06/28/2024 06:07 AM <3.50 mIU/mL Final     Comment:        <10.00 mIU/mL     Non-Immune  =>10.00 mIU/mL    Individual is considered to be immune to infection with HBV       Hep B results, dates, and Primary Source: Negativ/Susceptible 07/13/2024, from [facility]  Hep B dual verification performed by (RN): M. Georjean, RN  Machine disinfect process: standard heat      Patient's consent, code status, medications checked. Safety checks complete, time out performed.   Assessed  patient, +bruit/thrill, no redness, warmth or drainage noted. Skin prepped per procedure using chloraprep scrub x 30 sec, followed by 30 sec dry time each site. Cannulated using 15g needles each site and secured with tape. HD initiated. Access visible, lines secure.           INTRA DIALYSIS   08/07/24 0127   Treatment   Time On 0127   Treatment Goal 2.5L   Vital Signs   BP 132/70   Pulse 80   Treatment Initiation   Dialyze Hours 3   Treatment  Initiation Universal Precautions maintained;Lines secured to patient;Connections secured;Prime given;Venous Parameters set;Arterial Parameters set;Air foam detector engaged;Dialysate;Saline line double clamped   During Hemodialysis Assessment   Blood Flow Rate (ml/min) 200 ml/min   Arterial Pressure (mmHg) -10 mmHg   Venous Pressure (mmHg) 60   TMP 60   DFR 600   Comments treatment started   Access Visible Yes   Ultrafiltration Rate (ml/hr) 1000 ml/hr   Ultrafiltration Removed (ml) 0 ml        08/07/24 0130   During Hemodialysis  Assessment   BP (!) 103/59   Pulse 84   Blood Flow Rate (ml/min) 400 ml/min   Arterial Pressure (mmHg) -120 mmHg   Venous Pressure (mmHg) 150   TMP 40   DFR 600   Comments bp trended down, uf off   Access Visible Yes   Ultrafiltration Rate (ml/hr) 0 ml/hr   Ultrafiltration Removed (ml) 88 ml      08/07/24 0145   During Hemodialysis Assessment   BP 116/62   Pulse 83   Blood Flow Rate (ml/min) 400 ml/min   Arterial Pressure (mmHg) -120 mmHg   Venous Pressure (mmHg) 150   TMP 40   DFR 600   Comments uf resumed, uf goal set at 2L   Access Visible Yes   Ultrafiltration Rate (ml/hr) 850 ml/hr   Ultrafiltration Removed (ml) 88 ml      08/07/24 0230   During Hemodialysis Assessment   BP 114/71   Pulse 85   Blood Flow Rate (ml/min) 400 ml/min   Arterial Pressure (mmHg) -150 mmHg   Venous Pressure (mmHg) 170   TMP 20   DFR 600   Comments bp trended down, uf off, no complaints reported   Access Visible Yes   Ultrafiltration Rate (ml/hr) 850 ml/hr   Ultrafiltration Removed (ml) 849 ml      08/07/24 0235   During Hemodialysis Assessment   BP 117/73   Pulse 85   Comments bp stable, uf resumed      08/07/24 0315   During Hemodialysis Assessment   BP 92/60   Pulse 88   Blood Flow Rate (ml/min) 400 ml/min   Arterial Pressure (mmHg) -160 mmHg   Venous Pressure (mmHg) 180   TMP 30   DFR 600   Comments bp dropped and patient vomiting, uf off, albumin  given   Access Visible Yes   Ultrafiltration Rate (ml/hr) 0 ml/hr   Ultrafiltration Removed (ml) 1395 ml      08/07/24 0324   During Hemodialysis Assessment   BP (!) 143/83   Pulse 96   Comments bp improved but still nauseous, uf off maintained      08/07/24 0335   During Hemodialysis Assessment   BP (!) 152/85   Pulse 97   Comments bp improved, patient reports feeling better after medication, uf resumed  (UF goal set to 1.5L d/t high UFR)      08/07/24 0405  During Hemodialysis Assessment   BP (!) 62/39   Pulse 81   Comments bp dropped and pt vomiting, uf off, given NS 200CC       08/07/24 0409   During Hemodialysis Assessment   BP (!) 158/89   Pulse 100   Comments bp improved pt still nauseous, maintained uf off   Ultrafiltration Removed (ml) 1750 ml      08/07/24 0430   During Hemodialysis Assessment   BP 139/77   Pulse 90   Blood Flow Rate (ml/min) 200 ml/min   Arterial Pressure (mmHg) -50 mmHg   Venous Pressure (mmHg) 80   TMP 40   DFR 600   Comments treatment completed  (pt is still nauseous)   Access Visible Yes   Ultrafiltration Rate (ml/hr) 0 ml/hr   Ultrafiltration Removed (ml) 1750 ml         POST DIALYSIS     08/07/24 0445   Treatment   Time On 0430   Treatment Goal 2.5L in 3hrs  (uf goal not met)   Observations & Evaluations   Level of Consciousness 0   Heart Rhythm Regular   Respiratory Quality/Effort Unlabored   O2 Device None (Room air)   Bilateral Breath Sounds Clear   Skin Condition/Temp Dry;Warm   Abdomen Inspection Soft;Flat   Edema None   Vital Signs   BP (!) 164/89   Temp 97.7 F (36.5 C)   Pulse 93   Respirations 20   SpO2 100 %   Pain Assessment   Pain Assessment 0-10   Pain Level 10   Pain Location Abdomen   Hemodialysis Fistula/Graft Arteriovenous fistula Left Arm   No placement date or time found.   Present on Admission/Arrival: Yes  Access Type: Arteriovenous fistula  Orientation: Left  Access Location: Arm   Site Assessment Clean, dry & intact   Thrill Absent   Bruit Absent   Status Deaccessed   Date of Last Dressing Change 08/07/24   Dressing Intervention New   Dressing Status New dressing applied;Clean, dry & intact   Post-Hemodialysis Assessment   Post-Treatment Procedures Blood returned;Access bleeding time < 10 minutes   Machine Disinfection Process Acid/Vinegar Clean;Heat Disinfect;Exterior Engineer, Agricultural Volume (ml) 250 ml   Blood Volume Processed (Liters) 69.2 L   Dialyzer Clearance Moderately streaked   Duration of Treatment (minutes) 180 minutes   Heparin  Amount Administered During Treatment (mL) 0 mL   Hemodialysis Intake (ml) 750  ml  (prime/rinseback + NS 200cc + albumin )   Hemodialysis Output (ml) 1750 ml   NET Removed (ml) 1000   Tolerated Treatment Poor   Interventions Taken Fluid bolus;Medication;Ultrafiltration stopped;Ultrafiltration goal decreased   Patient Response to Treatment poorly tolerated   Physician Notified No   Patient Disposition Other (Comment)  (remain in room)   Handoff   Handoff Given To S. Blondie, RN   Handoff Received From E. Kingston, RN   Handoff Communication Face to Face   Time Handoff Given 0045       Primary RN SBAR: CANDIE Blondie, RN  Comments: Pt had nausea and vomiting which is managed by Zofran  IV given by primary RN. She also had hypotension which was managed with albumin , NS bolus and decreasing UF goal to 1L.  HD completed and tolerated poorly. All possible blood returned. Needles removed and pressure held until hemostasis achieved. New dressing applied, C/D/I. AVG access with good thrill and bruit. Upon departure, pt is awake, alert and oriented. Pt still reports nausea.  Bed lowered and locked. Side rails up x 2. Call bell in reach. Report given to Primary RN.              "

## 2024-08-07 NOTE — Progress Notes (Signed)
"  Pharmacy Dosing Services: 08/07/24  Pepcid  dose change per renal protocol  Physician Dr Leonetta    Serum Creatinine Lab Results   Component Value Date/Time    CREATININE 3.34 08/07/2024 11:19 AM      Creatinine Clearance Estimated Creatinine Clearance: 17 mL/min (A) (based on SCr of 3.34 mg/dL (H)).   BUN Lab Results   Component Value Date/Time    BUN 14 08/07/2024 11:19 AM         Current regimen: Pepcid  20 mg po BID  New regimen: Pepcid  20 mg daily    Thank you  Detavious Rinn DELENA BRACKET, RPH  [phone]    "

## 2024-08-07 NOTE — Progress Notes (Signed)
"  2315 SBAR report received from [employer].     2334 Pt arrived to unit. Pt A&O x4. Normal saline infusing at 100 mL/hr. Pt asked to delay CHG at this time due to reporting 10/10 abdominal and back pain. Pt vomiting. Medications given. See MAR.    0347 Pt currently getting dialysis. Morning labs deferred at this time.     0700 Bedside and Verbal shift change report given to [facility] RN (oncoming nurse) by Lauraine RN (offgoing nurse). Report included the following information Nurse Handoff Report, Adult Overview, Recent Results, Cardiac Rhythm normal sinus, and Neuro Assessment.     "

## 2024-08-07 NOTE — Progress Notes (Signed)
"  Pt continues to vomit and report abd pain of 10. Pt again found eating food provided by family. Pt educated again on NPO status.  "

## 2024-08-07 NOTE — Care Coordination (Signed)
 "   08/07/24 1017   Service Assessment   Information Provided By Patient   Patient Orientation Alert   Cognition No Apparent Deficit   Primary Caregiver Family   Support Systems Children;Family Members   Patient's Healthcare Decision Maker is: Legal Next of Kin   PCP Verified by CM No PCP   Prior Functional Level Independent in ADLs/IADLs   Current Functional Level at Time of Initial Assessment Unable to Assess   Can patient return to prior living arrangement Yes   Ability to make needs known: Good   Family able to assist with home care needs: Yes   Other than yourself, who should be included in your transition plan for discharge from the hospital? daughter Melinda [name] Help From [facility]   Current [facility] None   Social/Functional History   Prior Level of Assist for ADLs Independent   Active Driver Yes   Discharge Planning    Location Prior to Acute Admission House   Lives With Family;Daughter   Current Services Prior To Admission Dialysis  ([facility] MWF)   Dialysis Type Hemodialysis   Dialysis Days M/W/F   Transportation (free text) daughter   Potential Assistance Needed N/A  (JG tube)   Potential Assistance Obtaining Medications No   Patient expects to be discharged to: Home   Condition of Participation: Discharge Planning   The Plan for Transition of Care is related to the following treatment goals: home with family assist         Care Management Initial Assessment  08/07/2024 10:22 AM  If patient is discharged prior to next notation, then this note serves as note for discharge by case management.    Reason for Admission:   Hypertensive emergency [I16.1]  Intractable nausea and vomiting [R11.2]         Patient Admission Status: Inpatient  Date Admitted to INP: 08/06/24  RUR: Readmission Risk Score: 40.1    Hospitalization in the last 30 days (Readmission):  No        Advance Care Planning:  Code Status: Full Code  Primary Healthcare Decision Maker: (P) Legal Next of Kin  Primary  Decision Maker: Melinda [name] Child [phone]   Advance Directive: has NO advanced directive - not interested in additional information     __________________________________________________________________________  Assessment:   Met with patient at bedside, CM role discussed. Lives with  her daughter and family at address of record.  Outpatient hemodialysis at [facility], MWF. Patient has GJ tube, states she get feedings at home but unable to recall vendor.  Patient drives when able, daughter assists with transport needs.  Nephrology following.    Comments:     Discharge Concerns: [] Yes [x] No [] Unknown   Describe:    Financial concerns/barriers: [] Yes, explain: [x] No [] Unknown/Not discussed  __________________________________________________________________________    Insurer:   Active Insurance as of 08/06/2024       Primary Coverage       Payor Plan Insurance Group Employer/Plan Group    Melinda [name] VANTAGE HMO [id number]       Payor Plan Address Payor Plan Phone Number Payor Plan Fax Number Effective Dates    [address] [phone]  07/11/2024 - None Entered    [place] [id number]         Subscriber Name Subscriber Birth Date Member ID       Melinda [name] Sep 29, 1981 [id number]  PCP: No primary care provider on file.   Address: No primary physician on file.   Phone number: None    Pharmacy:   [facility] [address] [place] - [address] [place] [address] [place] [phone] [place] [phone]  [address]  [place] [address]  Phone: [phone] Fax: [phone]    DC Transport:         Transition of care plan:    [] Unable to determine at this time. Awaiting clinical progress, and disposition recommendations.    []  Home. No assistance required.     []  Home. Pt refused recommended services.    [x]  Home with family assistance as needed, and outpatient follow-up.    []  Home with Outpatient PT and outpatient follow-up   Pt aware of OP appt? [] Yes, Provider:   [] Not  scheduled   Transport provider:     []  Home with outpatient services.    Specify:    []  Home with Home Health   - Freedom of Choice offered? []  Yes, Preference:   []  NA    [] SNF/IPR   -[] Freedom of Choice offered, and preferences given:   [] Listing provided and preferences requested   -Status: [] Pending [] Accepted:    -Auth required: [] Yes [] No    -Auth initiated date:   -3 midnight stay required: [] Yes [] No  Date satisfied:     []  LTC:     []  Home with Hospice   - Freedom of Choice offered? []  Yes, Preference:   []  NA    []  Dispatch Health information provided.     []  Other:       [place] Melinda Girt, RN  Case Management Department  For questions or concerns, please PerfectServe        "

## 2024-08-07 NOTE — Progress Notes (Addendum)
 "[facility]  [address] [place], [place] [address]  [phone]        Hospitalist Progress Note      NAME: Melinda [name]   DOB:  Mar 25, 1981  MRM:  [id number]    Date/Time of service: 08/07/2024  1:51 PM       Subjective:   Patient was personally seen and examined by me during this time period.  Chart reviewed.     Patient is repeatedly vomiting while I am in the room.  Still reporting abdominal pain.  Did try some food from home this morning.  No recent dietary changes.    ROS: per history above       Objective:       Vitals:       Last 24hrs VS reviewed since prior progress note. Most recent are:    Vitals:    08/07/24 1322   BP:    Pulse:    Resp: 22   Temp:    SpO2:      SpO2 Readings from Last 6 Encounters:   08/07/24 93%   07/20/24 94%   07/11/24 98%   07/09/24 98%   07/05/24 95%   07/02/24 95%          Intake/Output Summary (Last 24 hours) at 08/07/2024 1351  Last data filed at 08/07/2024 0800  Gross per 24 hour   Intake 1817.4 ml   Output 2410 ml   Net -592.6 ml        Exam:     Physical Exam:    Gen:  Well-developed, well-nourished, appears uncomfortable.  HEENT:  Pink conjunctivae, hearing intact to voice  Neck:  Supple  Resp:  No accessory muscle use, clear breath sounds without wheezes rales or rhonchi  Card:  No murmurs, normal S1, S2 without thrills, bruits or peripheral edema  Abd:  Soft, nontender, nondistended.  Skin:  No rashes or ulcers, skin turgor is good  Neuro:  Cranial nerves 3-12 are grossly intact, follows commands appropriately  Psych:  Good insight, oriented to person, place and time, alert      Medications Reviewed: (see below)    Lab Data Reviewed: (see below)    ______________________________________________________________________    Medications:     Current Facility-Administered Medications   Medication Dose Route Frequency    albumin  human 25% IV solution 12.5 g  12.5 g IntraVENous PRN    0.9 % sodium chloride  infusion   IntraVENous  Continuous    [START ON 08/08/2024] famotidine  (PEPCID ) tablet 20 mg  20 mg Oral Daily    diazePAM  (VALIUM ) injection 5 mg  5 mg IntraVENous Q4H PRN    sodium chloride  flush 0.9 % injection 5-40 mL  5-40 mL IntraVENous 2 times per day    sodium chloride  flush 0.9 % injection 5-40 mL  5-40 mL IntraVENous PRN    0.9 % sodium chloride  infusion   IntraVENous PRN    heparin  (porcine) injection 5,000 Units  5,000 Units SubCUTAneous 3 times per day    ondansetron  (ZOFRAN -ODT) disintegrating tablet 4 mg  4 mg Oral Q8H PRN    Or    ondansetron  (ZOFRAN ) injection 4 mg  4 mg IntraVENous Q6H PRN    polyethylene glycol (GLYCOLAX ) packet 17 g  17 g Oral Daily PRN    acetaminophen  (TYLENOL ) tablet 650 mg  650 mg Oral Q6H PRN    Or    acetaminophen  (TYLENOL ) suppository 650 mg  650 mg Rectal  Q6H PRN    amLODIPine  (NORVASC ) tablet 10 mg  10 mg Oral Daily    aspirin  chewable tablet 81 mg  81 mg Oral Daily    atorvastatin  (LIPITOR ) tablet 80 mg  80 mg Oral QHS    carvedilol  (COREG ) tablet 12.5 mg  12.5 mg Oral BID WC    clopidogrel  (PLAVIX ) tablet 75 mg  75 mg Oral Daily    DULoxetine  (CYMBALTA ) extended release capsule 60 mg  60 mg Oral Daily    hydrALAZINE  (APRESOLINE ) tablet 50 mg  50 mg Oral TID    sennosides-docusate sodium  (SENOKOT-S) 8.6-50 MG tablet 1 tablet  1 tablet Oral BID    sevelamer  (RENVELA ) packet 0.8 g  0.8 g Oral TID WC    sodium bicarbonate  tablet 650 mg  650 mg Oral TID    oxyCODONE  (ROXICODONE ) immediate release tablet 5 mg  5 mg Oral Q4H PRN    Or    oxyCODONE  (ROXICODONE ) immediate release tablet 10 mg  10 mg Oral Q4H PRN    morphine  sulfate (PF) injection 2 mg  2 mg IntraVENous Q2H PRN    Or    morphine  sulfate (PF) injection 4 mg  4 mg IntraVENous Q2H PRN    insulin  lispro (HUMALOG ,ADMELOG ) injection vial 0-8 Units  0-8 Units SubCUTAneous 4x Daily AC & HS    pantoprazole  (PROTONIX ) 40 mg in sodium chloride  (PF) 0.9 % 10 mL injection  40 mg IntraVENous Daily    metoclopramide  (REGLAN ) injection 5 mg  5 mg  IntraVENous Q6H PRN          Lab Review:     Recent Labs     08/06/24  1554 08/07/24  1205   WBC 10.2 8.4   HGB 12.1 11.5   HCT 35.1 34.7*   PLT 405* 307     Recent Labs     08/06/24  1554 08/07/24  1119   NA 139 139   K 3.8 4.1   CL 94* 97*   CO2 29 30*   BUN 41* 14   ALT 48* 32     No results found for: GLUCPOC       Assessment / Plan:     Principal Problem:    Hypertensive emergency  Resolved Problems:    * No resolved hospital problems. *        43 y.o. female with past medical history of ESRD on HD MWF, DM2, HTN, gastroparesis requiring JG tube who presented to emergency room with severe abdominal pain that started yesterday     # Diffuse abdominal pain with nausea and vomiting: Acute, present admission.  Patient has been admitted repeatedly for this in the past.  Her symptoms previously have been attributed to gastroparesis.  Patient does have a GJ tube.  CT abdomen/pelvis without acute process.  -GI consult  - N.p.o. for bowel rest  - UDS  - Normal saline IV fluids as patient does make urine  - Reglan  and other antiemetics as needed.  Could consider oral erythromycin .  - IV Benadryl   - Consider B6  - Could also consider nortriptyline  - No period in over a year and patient is status post tubal ligation making pregnancy unlikely, pregnancy test pending  - IV Protonix   --TSH      # Hypertension POA  Will continue patient's home Coreg /hydralazine /amlodipine   Continue vitals per protocol     # #ESRD  On HD   Nephrology consulted    #GERD: Chronic.  Continue Pepcid .  IV Protonix  while NPO.     # T2DM with gastroparesis. POA, chronic.  Last A1c of 6.1 on 9/17.  Sliding scale insulin .    # S/p GJ tube- Required GJ tube placement for adequate nutrition.  Nutrition consulted, will hold tube feeds until seen by GI    Chronic pain or anxiety/depression?:  Unclear why on duloxetine  but will continue for now.  Continue BuSpar .    DAPT: Unclear why patient is on dual antiplatelet therapy.  Will continue for now.  May  need to be titrated off outpatient       **Prior records, notes, labs, radiology, and medications reviewed in [facility] if needed**    Total time spent with patient care: 35 min **I personally saw and examined the patient during this time period**                 Care Plan discussed with: Patient and Family    Discussed:  Care Plan    Prophylaxis:  Hep SQ    Disposition:  Home w/Family           ___________________________________________________    Attending Physician: Prentice JONETTA Gave, MD    "

## 2024-08-07 NOTE — Consults (Signed)
 "Comprehensive Nutrition Assessment    Type and Reason for Visit: Initial, Consult    Nutrition Recommendations/Plan:     TF rec's when ready to start PO/TF:         - Nepro at 40 ml/hr x 10 hours          - 1 ProSource daily (Flush with 15 ml h20 before and after)         - 30 ml h20 flush q4h around the clock.    2.   3 Carb Choice, No concentrated sweets, Low Fiber diet order when medically appropriate       Malnutrition Assessment:  Malnutrition Status:  Insufficient data (08/07/24 1316)    Context:  Chronic Illness     Findings of the 6 clinical characteristics of malnutrition:  Energy Intake:  Mild decrease in energy intake  Weight Loss:  Mild weight loss     Body Fat Loss:  Unable to assess     Muscle Mass Loss:  Unable to assess    Fluid Accumulation:  No fluid accumulation    Grip Strength:  Not Performed     Nutrition Assessment:    43 yo female admitted for HTN emergency, N/V.  Pmhx: DM, Gastroparesis, HTN, ESRD - on HD, GJ tube.    MD consult received for TF rec's. Chart review indicate G-J tube placed in August 2025 and started on cyclic TF.  Spoke with pt's daughter at bedside.  She reports pt eats by mouth at home.  Some days she has n/v and is not able to tolerate PO so she gives herself 3 bolus feeds of NovaSource Renal 2.0 3x/day.  Pt and daughter unsure how much she gives herself, 2 large syringes full of feed.  On days when she eats and tolerates PO then she does not use feeding tube or might do one feed at night.  When I asked what foods she eats by mouth, she told me soups, cooked vegetables, and fruits.  Discussed low fiber diet necessary for gastroparesis, recommended all vegetables be cooked very soft, no beans in her soups, and fruits be canned.  Daughter states she has been losing weight, unable to quantify.  Weight hx in EMR indicates 6% loss over last 5 months which is not significant for time frame.  Weight seems to fluctuate between 115-135 lbs over last 1.5 years.      Pt currently  NPO, MD wants to hold off on starting TF.  Pt was actively vomiting during my visit.  Did not complete NFPE because of this.     TF rec's when ready to start PO/TF:  Nepro at 40 ml/hr x 10 hours + 1 ProSource daily + 30 ml h20 flush q4h around the clock.  This will provide 400 ml, 800 kcals (59% kcal needs), 52 gm protein (74% protein needs), 64 gm CHO, 290 + 30 + 180 = 500 ml water        Nutritionally Significant Medications:  NaCl at 75 ml/hr; Pepcid , Humalog , Senokot, Renvela , Zofran , Reglan     Estimated Daily Nutrient Needs:  Energy Requirements Based On: Kcal/kg  Weight Used for Energy Requirements: Current  Energy (kcal/day): 1365-1640 (25-30 kcals/kg)  Weight Used for Protein Requirements: Current  Protein (g/day): 70 (1.3 gm/kg)  Method Used for Fluid Requirements: 1 ml/kcal  Fluid (ml/day): 1365    Nutrition Related Findings:   Edema: None                  Recent  Labs     08/06/24  1554 08/07/24  1119   GLUCOSE 190* 137*   BUN 41* 14   CREATININE 5.77* 3.34*   NA 139 139   K 3.8 4.1   CL 94* 97*   CO2 29 30*   CALCIUM  9.2 8.8     Recent Labs     08/07/24  0805 08/07/24  1125   POCGLU 197* 147*     Lab Results   Component Value Date/Time    LABA1C 6.1 06/27/2024 05:10 AM    LABA1C 7.0 02/19/2024 05:02 AM    LABA1C 6.8 10/24/2023 03:52 AM    EAG 127 06/27/2024 05:10 AM     Triglycerides   Date Value Ref Range Status   10/12/2023 155 (H) <150 MG/DL Final     Comment:     Based on NCEP-ATP III:  Triglycerides <150 mg/dL  is considered normal, 150-199 mg/dL  borderline high,  799-500 mg/dL high and  greater than or equal to 500 mg/dL very high.   09/21/2023 453 (H) <150 MG/DL Final     Comment:     Based on NCEP-ATP III:  Triglycerides <150 mg/dL  is considered normal, 150-199 mg/dL  borderline high,  799-500 mg/dL high and  greater than or equal to 500 mg/dL very high.   09/01/2023 346 (H) <150 MG/DL Final     Comment:     Based on NCEP-ATP III:  Triglycerides <150 mg/dL  is considered normal, 150-199 mg/dL   borderline high,  799-500 mg/dL high and  greater than or equal to 500 mg/dL very high.       Last BM:      Wounds: Wound Type: None    Current Nutrition Therapies:  Diet: NPO  Supplements: none  Meal Intake:   No data found.  Supplement Intake:  No data found.  Nutrition Support: none    Anthropometric Measures:  Height: 149.9 cm (4' 11.02)  Ideal Body Weight (IBW): 95 lbs (43 kg)       Current Body Weight: 54.6 kg (120 lb 5.9 oz), 126.7 % IBW. Weight Source: Bed scale  Current BMI (kg/m2): 24.3        Weight Adjustment For: No Adjustment                 BMI Categories: Normal Weight (BMI 18.5-24.9)    Wt Readings from Last 10 Encounters:   08/06/24 54.6 kg (120 lb 5.9 oz)   07/20/24 57.2 kg (126 lb 1.7 oz)   07/11/24 54.6 kg (120 lb 5.9 oz)   07/09/24 55.5 kg (122 lb 5.7 oz)   03/06/24 57.6 kg (127 lb)   02/21/24 58 kg (127 lb 13.9 oz)   11/28/23 52.2 kg (115 lb 1.3 oz)   10/31/23 52.6 kg (115 lb 15.4 oz)   10/21/23 55.2 kg (121 lb 11.1 oz)   10/12/23 60.4 kg (133 lb 3.2 oz)       Nutrition Diagnosis:   Inadequate protein-energy intake related to nausea/vomiting/diarrhea as evidenced by poor intake prior to admission    Nutrition Interventions:   Food and/or Nutrient Delivery: Start Oral Diet, Start Tube Feeding  Nutrition Education/Counseling: Education/Counseling initiated  Coordination of Nutrition Care: Continue to monitor while inpatient       Goals:     Goals: Meet at least 75% of estimated needs, by next RD assessment, Initiate PO diet, Initiate nutrition support       Nutrition Monitoring and Evaluation:   Behavioral-Environmental Outcomes:  None Identified  Food/Nutrient Intake Outcomes: Diet Advancement/Tolerance, Food and Nutrient Intake, Supplement Intake, Enteral Nutrition Intake/Tolerance  Physical Signs/Symptoms Outcomes: Biochemical Data, Nausea or Vomiting, Weight    Discharge Planning:    Too soon to determine     Adella Acron, RD  Available via PerfectServe    "

## 2024-08-07 NOTE — Progress Notes (Signed)
"  Spiritual Health Progress Note  [place]      Room # B306/01    Name: Melinda [name]           Age: 43 y.o.    Gender: female          MRN: [id number]  Religion: Catholic       Preferred Language: Spanish      Date: 08/07/24  Visit Time: Begin Time: 1255 End Time : 1300  Complexity of Encounter: Low      Visit Summary: Chaplain met with patient and children in response to Catholic rounds. Patient has recently been in [facility].  Today she was nauseated and not feeling well. Prayer for spiritual communion offered for her.  Her two children joined in the prayer but did not receive communion.      Referral/Consult From: Clergy/Religious Leader  Encounter Overview/Reason: Rituals, Rites and Sacraments  Encounter Code:     Crisis (if applicable):    Service Provided For: Patient     Patient was available.    Faith, Belief, Meaning:   Patient is connected with a faith tradition or spiritual practice  Family/Friends are connected with a faith tradition or spiritual practice  Rituals (if applicable) Type: Catholic Communion    Importance and Influence:  Patient has spiritual/personal beliefs that influence decisions regarding their health  Family/Friends Other: unknown    Community:  Patient   is connected with a spiritual community  Family/Friends   unable to assess at this time     Assessment and Plan of Care:   Emotions Expressed by Patient:   Assessment: Other (Comment) (nauseated)    Interventions by Chaplain:   Intervention: Prayer (assurance of)/Blessing     Result/ Response by Patient:   Outcome: Refused/Declined, Other (comment) (nauseated)    Patient Plan of Care:   Plan and Referrals  Plan/Referrals: Continue to visit, (comment)     Emotions Expressed by Spouse/Family/Friends:   gratitude    Chaplain Interventions with Spouse/ Family/Friends include:   prayer    Spouse/Family/Friends Plan of Care:   Spiritual care available upon referral.      Electronically signed by Chaplain Sr. Buel Sauers, SBS, RN,  ACSW, LCSW  Chaplain Page:  [phone])  "

## 2024-08-07 NOTE — Progress Notes (Signed)
"  Pt found vomitting with food and pepsi at bedside brought from family. Pt and family instructed that pt is NPO. MD made aware.  "

## 2024-08-07 NOTE — Consults (Signed)
 "                                                                           Melinda Peach, PA-C                       [phone] office             Monday-Friday 8:00 am-4:30 pm  I am not permitted to use perfect serve use above for contact, thanks.      Gastroenterology Consultation Note      Admit Date: 08/06/2024  Consult Date: 08/07/2024   I greatly appreciate your asking me to see Melinda [name], thank you very much for the opportunity to participate in her care.    Narrative Assessment and Plan   43 year old female well-known to GI with prior history of intractable nausea and vomiting found to have gastroparesis, inability to reliably obtain prokinetic and other GI related medications in outpatient setting, ultimately seen at [facility] with placement of PEG/J tube to address enteral access and GI distress symptoms, now admitted 08/06/2024 to [facility] with hypertensive emergency.  While admitted, GI has been consulted for emesis with J-tube.  Alkaline phosphatase is elevated, GGT was elevated last admission.  Underwent MRCP last admission for assessment of this with no biliary dilation.  Already s/p cholecystectomy 2024.  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 pylori negative.  UDS intermittently positive for THC, although most recently negative in May 2025.  CT A/P confirms gastrojejunostomy tube in appropriate place.    Impression:  Intractable nausea and vomiting  Gastroparesis s/p PEG/J    Plan:  Reasonable to continue pantoprazole  40 mg once daily and Pepcid  20 mg twice daily.  Consider scheduled antiemetics such as Zofran  or Reglan  3 times daily.  In the past, the patient has consumed some food by mouth in addition to tube feeds.  Until nausea and vomiting resolved, recommend strictly using feeding tube for nutrition as this is postpyloric.  Recheck urine drug screen.     Melinda JINNY Peach,  PA-C    Subjective:     Chief Complaint: Vomiting    History of Present Illness: GI consultation requested by Dr. Leonetta for emesis with J-tube.  42 year old female well-known to the GI service with a prior history of intractable nausea and vomiting found to have gastroparesis, inability to reliably obtain prokinetic and other GI related medications in outpatient setting ultimately seen at [facility] with placement of PEG/J tube to address enteral access and GI distress symptoms.  She was admitted 08/06/2024 with hypertensive emergency and while admitted, GI was consulted for emesis.  She is status postcholecystectomy, gallbladder explant pathology April 2024 revealed calculous cholecystitis.  She was assessed for similar symptoms by GI last month and MRCP was recommended due to elevated alkaline phosphatase levels.  MRCP revealed cardiomegaly and third spacing but no pancreatic or biliary duct dilation.  MRCP also showed normal unenhanced liver, pancreas.  This admission, LFTs remain elevated with AST 42, ALT 48, alk phos 272, total bilirubin 0.5.  GGT was elevated last admission at 634.  Urinalysis positive for THC  January 2025, February 2025, but then negative for [facility] May 2025.      Last EGD 09/21/2023 by Dr. Madlyn:  Impression:   Unremarkable EGD/upper endoscopy with minimal gastric erythema as previously noted  Mucosal biopsies obtained from the stomach, [place] biopsy protocol for further assessment of previously noted intestinal metaplasia  Mucosal biopsies obtained from the duodenum    FINAL PATHOLOGIC DIAGNOSIS     1.  Duodenum, biopsy:        Benign duodenal mucosa, no histopathologic changes   No evidence of villous atrophy or increased intraepithelial lymphocytes          2.  Gastric antrum, biopsy:        Chronic inactive antral gastritis with atrophy   No evidence of intestinal metaplasia or dysplasia     3.  Gastric angularis, biopsy:        Chronic inactive gastritis with atrophy   No evidence  of intestinal metaplasia or dysplasia     4.  Gastric body, biopsies:        Mild chronic inactive gastritis of the oxyntic type mucosa   No evidence of intestinal metaplasia or dysplasia     Addendum Comment   Immunohistochemical stains performed on block 2A, 3A, 4A for Helicobacter   pylori are negative.  Positive and negative controls section stain   appropriately.     PCP:  No primary care provider on file.    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)     Gastroparesis     Gastroparesis     GERD (gastroesophageal reflux disease)     High cholesterol     Hypertension         Past Surgical History:   Procedure Laterality Date    CAPSULE ENDOSCOPY N/A 01/20/2023    ESOPHAGEAL CAPSULE ENDOSCOPY remove at 1624PM performed by Madlyn Fendt, MD at [facility] ENDOSCOPY    CHOLECYSTECTOMY, LAPAROSCOPIC N/A 02/03/2023    ROBOTIC LAPAROSCOPIC CHOLECYSTECTOMY with Indocyanine green  performed by Chrystal Elijah BRAVO, MD at [facility] MAIN OR    COLONOSCOPY N/A 01/19/2023    COLONOSCOPY DIAGNOSTIC performed by Madlyn Fendt, MD at [facility] ENDOSCOPY    INVASIVE VASCULAR N/A 10/03/2023    Angiography visceral SMA performed by Millard Oneil LABOR, MD at [facility] CARDIAC CATH LAB    INVASIVE VASCULAR N/A 10/03/2023    Ultrasound guided vascular access performed by Millard Oneil LABOR, MD at [facility] CARDIAC CATH LAB    INVASIVE VASCULAR N/A 10/03/2023    Insert stent peripheral artery performed by Millard Oneil LABOR, MD at [facility] CARDIAC CATH LAB    IR NONTUNNELED VASCULAR CATHETER > 5 YEARS  10/10/2023    IR NONTUNNELED VASCULAR CATHETER > 5 YEARS 10/10/2023 [facility] CARDIAC CATH/EP/IR LAB    IR NONTUNNELED VASCULAR CATHETER > 5 YEARS  10/10/2023    IR NONTUNNELED VASCULAR CATHETER > 5 YEARS 10/10/2023 [facility] CARDIAC CATH/EP/IR LAB    OTHER SURGICAL HISTORY Left     Rentia attachment    TUBAL LIGATION Bilateral     UPPER GASTROINTESTINAL ENDOSCOPY N/A 01/17/2023    ESOPHAGOGASTRODUODENOSCOPY performed by  Madlyn Fendt, MD at [facility] ENDOSCOPY    UPPER GASTROINTESTINAL ENDOSCOPY N/A 01/17/2023    ESOPHAGOGASTRODUODENOSCOPY BIOPSY performed by Madlyn Fendt, MD at [facility] ENDOSCOPY    UPPER GASTROINTESTINAL ENDOSCOPY N/A 01/18/2023    ESOPHAGOGASTRODUODENOSCOPY performed by Madlyn Fendt, MD at [facility] ENDOSCOPY  UPPER GASTROINTESTINAL ENDOSCOPY N/A 05/13/2023    ESOPHAGOGASTRODUODENOSCOPY performed by Roseann Lonni BIRCH, MD at [facility] ENDOSCOPY    UPPER GASTROINTESTINAL ENDOSCOPY N/A 05/13/2023    ESOPHAGOGASTRODUODENOSCOPY BIOPSY performed by Roseann Lonni BIRCH, MD at [facility] ENDOSCOPY    UPPER GASTROINTESTINAL ENDOSCOPY N/A 09/21/2023    ESOPHAGOGASTRODUODENOSCOPY performed by Madlyn Fendt, MD at [facility] ENDOSCOPY    UPPER GASTROINTESTINAL ENDOSCOPY N/A 09/21/2023    ESOPHAGOGASTRODUODENOSCOPY BIOPSY performed by Madlyn Fendt, MD at [facility] ENDOSCOPY    US  FLUID COLLECTION DRAINAGE PERITONEAL/RETROPERITONEAL [facility]  02/11/2023    US  ABSCESS DRAINAGE PERITONEAL 02/11/2023 [facility] RAD US        Social History     Tobacco Use    Smoking status: Never    Smokeless tobacco: Never   Substance Use Topics    Alcohol use: Never        No family history on file.     No Known Allergies         Home Medications:  Prior to Admission Medications   Prescriptions Last Dose Informant Patient Reported? Taking?   DULoxetine  (CYMBALTA ) 60 MG extended release capsule   No No   Sig: Take 1 capsule by mouth daily   amLODIPine  (NORVASC ) 10 MG tablet   No No   Sig: Take 1 tablet by mouth daily   aspirin  81 MG chewable tablet   No No   Sig: Take 1 tablet by mouth daily   atorvastatin  (LIPITOR ) 80 MG tablet   Yes No   Sig: Take 1 tablet by mouth nightly   busPIRone  (BUSPAR ) 15 MG tablet   No No   Sig: Take 15 mg by mouth 3 times daily   Patient taking differently: Take 5 mg by mouth in the morning and at bedtime   carvedilol  (COREG ) 12.5 MG tablet   Yes No   Sig: Take 1 tablet by mouth 2 times daily (with meals)   clopidogrel  (PLAVIX ) 75 MG tablet   No No   Sig: Take 1 tablet by  mouth daily   famotidine  (PEPCID ) 20 MG tablet   Yes No   Sig: Take 1 tablet by mouth 2 times daily   hydrALAZINE  (APRESOLINE ) 50 MG tablet   Yes No   Sig: Take 1 tablet by mouth 3 times daily   lidocaine  4 % external patch   No No   Sig: Place 1 patch onto the skin daily   metFORMIN  (GLUCOPHAGE -XR) 500 MG extended release tablet   No No   Sig: Take 2 tablets by mouth daily (with breakfast)   Patient not taking: Reported on 06/26/2024   metoclopramide  (REGLAN ) 5 MG tablet   No No   Sig: Take 1 tablet by mouth 3 times daily as needed (nausea/vomiting)   ondansetron  (ZOFRAN -ODT) 4 MG disintegrating tablet   No No   Sig: Take 1 tablet by mouth every 8 hours as needed for Nausea or Vomiting   pantoprazole  (PROTONIX ) 40 MG tablet   No No   Sig: Take 1 tablet by mouth 2 times daily (before meals)   prochlorperazine  (COMPAZINE ) 10 MG tablet   No No   Sig: Take 1 tablet by mouth every 6 hours as needed (nausea or vomiting)   sennosides-docusate sodium  (SENOKOT-S) 8.6-50 MG tablet   No No   Sig: Take 1 tablet by mouth in the morning and at bedtime   sevelamer  (RENVELA ) 0.8 g PACK packet   No No   Sig: Take 1 packet by mouth 3 times daily (with  meals)   sodium bicarbonate  650 MG tablet   No No   Sig: Take 1 tablet by mouth 3 times daily      Facility-Administered Medications: None       Hospital Medications:  Current Facility-Administered Medications   Medication Dose Route Frequency    albumin  human 25% IV solution 12.5 g  12.5 g IntraVENous PRN    diazePAM  (VALIUM ) injection 5 mg  5 mg IntraVENous Q4H PRN    sodium chloride  flush 0.9 % injection 5-40 mL  5-40 mL IntraVENous 2 times per day    sodium chloride  flush 0.9 % injection 5-40 mL  5-40 mL IntraVENous PRN    0.9 % sodium chloride  infusion   IntraVENous PRN    heparin  (porcine) injection 5,000 Units  5,000 Units SubCUTAneous 3 times per day    ondansetron  (ZOFRAN -ODT) disintegrating tablet 4 mg  4 mg Oral Q8H PRN    Or    ondansetron  (ZOFRAN ) injection 4 mg  4 mg  IntraVENous Q6H PRN    polyethylene glycol (GLYCOLAX ) packet 17 g  17 g Oral Daily PRN    acetaminophen  (TYLENOL ) tablet 650 mg  650 mg Oral Q6H PRN    Or    acetaminophen  (TYLENOL ) suppository 650 mg  650 mg Rectal Q6H PRN    amLODIPine  (NORVASC ) tablet 10 mg  10 mg Oral Daily    aspirin  chewable tablet 81 mg  81 mg Oral Daily    atorvastatin  (LIPITOR ) tablet 80 mg  80 mg Oral QHS    carvedilol  (COREG ) tablet 12.5 mg  12.5 mg Oral BID WC    clopidogrel  (PLAVIX ) tablet 75 mg  75 mg Oral Daily    DULoxetine  (CYMBALTA ) extended release capsule 60 mg  60 mg Oral Daily    famotidine  (PEPCID ) tablet 20 mg  20 mg Oral BID    hydrALAZINE  (APRESOLINE ) tablet 50 mg  50 mg Oral TID    sennosides-docusate sodium  (SENOKOT-S) 8.6-50 MG tablet 1 tablet  1 tablet Oral BID    sevelamer  (RENVELA ) packet 0.8 g  0.8 g Oral TID WC    sodium bicarbonate  tablet 650 mg  650 mg Oral TID    oxyCODONE  (ROXICODONE ) immediate release tablet 5 mg  5 mg Oral Q4H PRN    Or    oxyCODONE  (ROXICODONE ) immediate release tablet 10 mg  10 mg Oral Q4H PRN    morphine  sulfate (PF) injection 2 mg  2 mg IntraVENous Q2H PRN    Or    morphine  sulfate (PF) injection 4 mg  4 mg IntraVENous Q2H PRN    insulin  lispro (HUMALOG ,ADMELOG ) injection vial 0-8 Units  0-8 Units SubCUTAneous 4x Daily AC & HS    pantoprazole  (PROTONIX ) 40 mg in sodium chloride  (PF) 0.9 % 10 mL injection  40 mg IntraVENous Daily    metoclopramide  (REGLAN ) injection 5 mg  5 mg IntraVENous Q6H PRN       Review of Systems: Admission ROS by Prentice Arnold, MD from 08/06/2024 were reviewed with the patient and changes (other than per HPI) include: none      Objective:     Physical Exam:  Vitals:    08/07/24 0907   BP: (!) 159/82   Pulse: 88   Resp:    Temp:    SpO2:      SpO2 Readings from Last 6 Encounters:   08/07/24 98%   07/20/24 94%   07/11/24 98%   07/09/24 98%   07/05/24 95%   07/02/24 95%  Intake/Output Summary (Last 24 hours) at 08/07/2024 0943  Last data filed at 08/07/2024  0445  Gross per 24 hour   Intake 750 ml   Output 2250 ml   Net -1500 ml        General: alert and no distress  Head: Normocephalic, without obvious abnormality, atraumatic  Eyes: anicteric sclerae and conjuntiva clear  Lungs: normal respiratory effort  Abd: not distended, soft, +mild generalized tenderness to palpation  Ext: no cyanosis and no edema  Skin: normal skin color, no rashes, and texture normal  Neuro:  Alert and oriented  Psych: not anxious, cooperative, appropriate affect       Laboratory:    Recent Results (from the past 24 hours)   CBC with Auto Differential    Collection Time: 08/06/24  3:54 PM   Result Value Ref Range    WBC 10.2 3.6 - 11.0 K/uL    RBC 3.88 3.80 - 5.20 M/uL    Hemoglobin 12.1 11.5 - 16.0 g/dL    Hematocrit 64.8 64.9 - 47.0 %    MCV 90.5 80.0 - 99.0 FL    MCH 31.2 26.0 - 34.0 PG    MCHC 34.5 30.0 - 36.5 g/dL    RDW 85.3 (H) 88.4 - 14.5 %    Platelets 405 (H) 150 - 400 K/uL    MPV 9.1 8.9 - 12.9 FL    Nucleated RBCs 0.0 0 PER 100 WBC    nRBC 0.00 0.00 - 0.01 K/uL    Neutrophils % 74.9 32.0 - 75.0 %    Lymphocytes % 14.3 12.0 - 49.0 %    Monocytes % 5.1 5.0 - 13.0 %    Eosinophils % 4.0 0.0 - 7.0 %    Basophils % 0.8 0.0 - 1.0 %    Immature Granulocytes % 0.9 (H) 0.0 - 0.5 %    Neutrophils Absolute 7.61 1.80 - 8.00 K/UL    Lymphocytes Absolute 1.45 0.80 - 3.50 K/UL    Monocytes Absolute 0.52 0.00 - 1.00 K/UL    Eosinophils Absolute 0.41 (H) 0.00 - 0.40 K/UL    Basophils Absolute 0.08 0.00 - 0.10 K/UL    Immature Granulocytes Absolute 0.09 (H) 0.00 - 0.04 K/UL    Differential Type AUTOMATED     Comprehensive Metabolic Panel    Collection Time: 08/06/24  3:54 PM   Result Value Ref Range    Sodium 139 136 - 145 mmol/L    Potassium 3.8 3.5 - 5.1 mmol/L    Chloride 94 (L) 98 - 107 mmol/L    CO2 29 20 - 29 mmol/L    Anion Gap 16 (H) 2 - 14 mmol/L    Glucose 190 (H) 65 - 100 mg/dL    BUN 41 (H) 6 - 20 MG/DL    Creatinine 4.22 (H) 0.60 - 1.00 MG/DL    BUN/Creatinine Ratio 7 (L) 12 - 20       Est, Glom Filt Rate 9 (L) >59 ml/min/1.52m2    Calcium  9.2 8.6 - 10.0 MG/DL    Total Bilirubin 0.5 0.0 - 1.2 MG/DL    ALT 48 (H) 10 - 35 U/L    AST 42 (H) 10 - 35 U/L    Alk Phosphatase 272 (H) 35 - 104 U/L    Total Protein 8.5 (H) 6.4 - 8.3 g/dL    Albumin  3.8 3.5 - 5.2 g/dL    Globulin 4.6 (H) 2.0 - 4.0 g/dL    Albumin /Globulin Ratio 0.8 (L) 1.1 -  2.2     Lipase    Collection Time: 08/06/24  3:54 PM   Result Value Ref Range    Lipase 30 13 - 60 U/L   Urinalysis with Microscopic    Collection Time: 08/06/24  3:54 PM   Result Value Ref Range    Color, UA YELLOW/STRAW      Appearance CLEAR CLEAR      Specific Gravity, UA 1.021 1.003 - 1.030      pH, Urine 8.0 5.0 - 8.0      Protein, UA >300 (A) NEG mg/dL    Glucose, Ur 499 (A) NEG mg/dL    Ketones, Urine Negative NEG mg/dL    Bilirubin, Urine Negative NEG      Blood, Urine TRACE (A) NEG      Urobilinogen, Urine 0.2 0.2 - 1.0 EU/dL    Nitrite, Urine Negative NEG      Leukocyte Esterase, Urine Negative NEG      WBC, UA 0-4 0 - 4 /hpf    RBC, UA 0-5 0 - 5 /hpf    Epithelial Cells, UA FEW FEW /lpf    BACTERIA, URINE 2+ (A) NEG /hpf   Urine Culture Hold Sample    Collection Time: 08/06/24  3:54 PM    Specimen: Urine   Result Value Ref Range    Specimen HOld        Urine on hold in Microbiology dept for 2 days.  If unpreserved urine is submitted, it cannot be used for addtional testing after 24 hours, recollection will be required.   Extra Tubes Hold    Collection Time: 08/06/24  3:54 PM   Result Value Ref Range    Specimen HOld RED, SST, BLUE.1 SET OF BLOOD CULT     Comment:        Add-on orders for these samples will be processed based on acceptable specimen integrity and analyte stability, which may vary by analyte.   POC Lactic Acid    Collection Time: 08/06/24  4:03 PM   Result Value Ref Range    POC Lactic Acid 1.62 0.40 - 2.00 mmol/L   EKG 12 Lead    Collection Time: 08/06/24  4:08 PM   Result Value Ref Range    Ventricular Rate 107 BPM    Atrial Rate 107 BPM    P-R  Interval 140 ms    QRS Duration 86 ms    Q-T Interval 390 ms    QTc Calculation (Bazett) 520 ms    P Axis 62 degrees    R Axis -55 degrees    T Axis 57 degrees    Diagnosis       Sinus tachycardia  Possible Left atrial enlargement  Left axis deviation  Pulmonary disease pattern  Minimal voltage criteria for LVH, may be normal variant ( Cornell product )  Nonspecific ST and T wave abnormality  Prolonged QT  When compared with ECG of 09-Jul-2024 14:34,  QRS axis shifted right  Nonspecific T wave abnormality now evident in Inferior leads  T wave inversion no longer evident in Anterior leads  Confirmed by Elenor Aquas [phone]) on 08/07/2024 8:51:49 AM     POC Pregnancy Urine Qual    Collection Time: 08/06/24  6:45 PM   Result Value Ref Range    Preg Test, Ur Negative NEG     Troponin    Collection Time: 08/06/24  9:53 PM   Result Value Ref Range    Troponin T 124.0 (HH) 0 -  14 ng/L   Troponin    Collection Time: 08/06/24 11:06 PM   Result Value Ref Range    Troponin T 122.0 (HH) 0 - 14 ng/L   EKG 12 Lead    Collection Time: 08/07/24  6:21 AM   Result Value Ref Range    Ventricular Rate 97 BPM    Atrial Rate 97 BPM    P-R Interval 150 ms    QRS Duration 88 ms    Q-T Interval 388 ms    QTc Calculation (Bazett) 492 ms    P Axis 62 degrees    R Axis -56 degrees    T Axis 106 degrees    Diagnosis       Normal sinus rhythm  Possible Left atrial enlargement  Left axis deviation  Pulmonary disease pattern  Minimal voltage criteria for LVH, may be normal variant ( Cornell product )  Nonspecific ST and T wave abnormality  Prolonged QT  Abnormal ECG  When compared with ECG of 06-Aug-2024 16:08,  MANUAL COMPARISON REQUIRED DATA IS UNCONFIRMED  Confirmed by Elenor Aquas [id number] on 08/07/2024 8:52:47 AM     POCT Glucose    Collection Time: 08/07/24  8:05 AM   Result Value Ref Range    POC Glucose 197 (H) 65 - 117 mg/dL    Performed by: CHESLEY SEATS (PCT)          Assessment/Plan:     Principal Problem:    Hypertensive  emergency  Resolved Problems:    * No resolved hospital problems. *       See above narrative for full detail.      Signed by: Melinda JINNY Peach, PA-C 9:43 AM     "

## 2024-08-07 NOTE — Consults (Signed)
 "        NEPHROLOGY CONSULT NOTE     Patient: Melinda [name] MRN: [id number]  PCP: No primary care provider on file.   DOB:     06-05-1981  Age:   43 y.o.  Sex:  female      Referring physician: Lyell, Prentice BIRCH, MD  Reason for consultation:   Admission Date: 08/06/2024  3:18 PM  LOS: 1 day        ASSESSMENT and PLAN :   ESRD on HD MWF via LAVF, [facility], f/b Dr. Kalvin  Hypertensive urgency-better after HD  DM  AOCD  Chronic abd pain, n/v from gastroparesis-reason for admit      Plan:  Had HD last evening, next HD tomorrow MWF schedule  No need for epo with current Hgb  Resume home antihypertensives  Supportive care for gastroparesis  Thanks for the consult, we will follow along with you       Subjective:   HPI: Melinda [name] is a 43 y.o. Hispanic female with ESRD on HD MWF at [facility], DM with gastroparesis, HTN presenting to the ED from HD for worsening abd pain, n/v. Has supplemental PEG, has been trying to sip on soup, uses PEG for meds. Missed HD yesterday, arrived with SBPs in the 200s. We are consulted for HD needs.     Had HD overnight, Bps are better. Pt states her nausea and abd pain have not improved.     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)     Gastroparesis     Gastroparesis     GERD (gastroesophageal reflux disease)     High cholesterol     Hypertension         Past Surgical Hx:     Past Surgical History:   Procedure Laterality Date    CAPSULE ENDOSCOPY N/A 01/20/2023    ESOPHAGEAL CAPSULE ENDOSCOPY remove at 1624PM performed by Madlyn Fendt, MD at [facility] ENDOSCOPY    CHOLECYSTECTOMY, LAPAROSCOPIC N/A 02/03/2023    ROBOTIC LAPAROSCOPIC CHOLECYSTECTOMY with Indocyanine green  performed by Chrystal Elijah BRAVO, MD at [facility] MAIN OR    COLONOSCOPY N/A 01/19/2023    COLONOSCOPY DIAGNOSTIC performed by Madlyn Fendt, MD at [facility] ENDOSCOPY    INVASIVE VASCULAR N/A 10/03/2023    Angiography  visceral SMA performed by Millard Oneil LABOR, MD at [facility] CARDIAC CATH LAB    INVASIVE VASCULAR N/A 10/03/2023    Ultrasound guided vascular access performed by Millard Oneil LABOR, MD at [facility] CARDIAC CATH LAB    INVASIVE VASCULAR N/A 10/03/2023    Insert stent peripheral artery performed by Millard Oneil LABOR, MD at [facility] CARDIAC CATH LAB    IR NONTUNNELED VASCULAR CATHETER > 5 YEARS  10/10/2023    IR NONTUNNELED VASCULAR CATHETER > 5 YEARS 10/10/2023 [facility] CARDIAC CATH/EP/IR LAB    IR NONTUNNELED VASCULAR CATHETER > 5 YEARS  10/10/2023    IR NONTUNNELED VASCULAR CATHETER > 5 YEARS 10/10/2023 [facility] CARDIAC CATH/EP/IR LAB    OTHER SURGICAL HISTORY Left     Rentia attachment    TUBAL LIGATION Bilateral     UPPER GASTROINTESTINAL ENDOSCOPY N/A 01/17/2023    ESOPHAGOGASTRODUODENOSCOPY performed by Madlyn Fendt, MD at [facility] ENDOSCOPY    UPPER GASTROINTESTINAL ENDOSCOPY N/A 01/17/2023    ESOPHAGOGASTRODUODENOSCOPY BIOPSY performed by Madlyn Fendt, MD at [facility] ENDOSCOPY    UPPER  GASTROINTESTINAL ENDOSCOPY N/A 01/18/2023    ESOPHAGOGASTRODUODENOSCOPY performed by Madlyn Fendt, MD at [facility] ENDOSCOPY    UPPER GASTROINTESTINAL ENDOSCOPY N/A 05/13/2023    ESOPHAGOGASTRODUODENOSCOPY performed by Roseann Lonni BIRCH, MD at [facility] ENDOSCOPY    UPPER GASTROINTESTINAL ENDOSCOPY N/A 05/13/2023    ESOPHAGOGASTRODUODENOSCOPY BIOPSY performed by Roseann Lonni BIRCH, MD at [facility] ENDOSCOPY    UPPER GASTROINTESTINAL ENDOSCOPY N/A 09/21/2023    ESOPHAGOGASTRODUODENOSCOPY performed by Madlyn Fendt, MD at [facility] ENDOSCOPY    UPPER GASTROINTESTINAL ENDOSCOPY N/A 09/21/2023    ESOPHAGOGASTRODUODENOSCOPY BIOPSY performed by Madlyn Fendt, MD at [facility] ENDOSCOPY    US  FLUID COLLECTION DRAINAGE PERITONEAL/RETROPERITONEAL [facility]  02/11/2023    US  ABSCESS DRAINAGE PERITONEAL 02/11/2023 [facility] RAD US        Medications:  Prior to Admission medications   Medication Sig Start Date End Date Taking? Authorizing Provider   metoclopramide  (REGLAN ) 5 MG tablet Take 1 tablet by mouth 3 times daily as needed  (nausea/vomiting) 07/20/24 07/25/24  Cutchins, Abigail M, PA-C   lidocaine  4 % external patch Place 1 patch onto the skin daily 07/02/24   Karenann Barter, MD   sevelamer  (RENVELA ) 0.8 g PACK packet Take 1 packet by mouth 3 times daily (with meals) 07/02/24   Karenann Barter, MD   atorvastatin  (LIPITOR ) 80 MG tablet Take 1 tablet by mouth nightly    [provider]   carvedilol  (COREG ) 12.5 MG tablet Take 1 tablet by mouth 2 times daily (with meals)    [provider]   famotidine  (PEPCID ) 20 MG tablet Take 1 tablet by mouth 2 times daily    [provider]   hydrALAZINE  (APRESOLINE ) 50 MG tablet Take 1 tablet by mouth 3 times daily    [provider]   sodium bicarbonate  650 MG tablet Take 1 tablet by mouth 3 times daily 02/23/24   Muncy, Manuelita Bohr, MD   pantoprazole  (PROTONIX ) 40 MG tablet Take 1 tablet by mouth 2 times daily (before meals) 02/23/24   Muncy, Manuelita Bohr, MD   sennosides-docusate sodium  (SENOKOT-S) 8.6-50 MG tablet Take 1 tablet by mouth in the morning and at bedtime 11/06/23   Do, Khoi B, MD   ondansetron  (ZOFRAN -ODT) 4 MG disintegrating tablet Take 1 tablet by mouth every 8 hours as needed for Nausea or Vomiting 11/06/23   Do, Rodrick NOVAK, MD   prochlorperazine  (COMPAZINE ) 10 MG tablet Take 1 tablet by mouth every 6 hours as needed (nausea or vomiting) 10/25/23   Orson Planas, MD   metFORMIN  (GLUCOPHAGE -XR) 500 MG extended release tablet Take 2 tablets by mouth daily (with breakfast)  Patient not taking: Reported on 06/26/2024 10/25/23   Aktig, Ziya, MD   busPIRone  (BUSPAR ) 15 MG tablet Take 15 mg by mouth 3 times daily  Patient taking differently: Take 5 mg by mouth in the morning and at bedtime 10/14/23   Tefera, Mesfin A, MD   DULoxetine  (CYMBALTA ) 60 MG extended release capsule Take 1 capsule by mouth daily 10/15/23   Tefera, Mesfin A, MD   clopidogrel  (PLAVIX ) 75 MG tablet Take 1 tablet by mouth daily 10/15/23   Tefera, Mesfin A, MD   aspirin  81 MG chewable tablet Take  1 tablet by mouth daily 10/15/23   Tefera, Mesfin A, MD   amLODIPine  (NORVASC ) 10 MG tablet Take 1 tablet by mouth daily 03/23/23   Do, Khoi B, MD       No Known Allergies    Social Hx:  reports that she has never smoked. She has never  used smokeless tobacco. She reports that she does not drink alcohol and does not use drugs.     No family history on file.    Review of Systems:  A twelve point review of system was performed today. Pertinent positives and negatives are mentioned in the HPI. The reminder of the ROS is negative and noncontributory.     Objective:    Vitals:    Vitals:    08/07/24 0731 08/07/24 0855 08/07/24 0907 08/07/24 1112   BP: (!) 180/95  (!) 159/82    Pulse: 93 (!) 101 88    Resp: 18 18  21    Temp: 98.4 F (36.9 C)      TempSrc: Oral      SpO2: 98%      Weight:       Height:         I&O's:  10/27 0701 - 10/28 0700  In: 750   Out: 2250   BP (!) 159/82   Pulse 88   Temp 98.4 F (36.9 C) (Oral)   Resp 21   Ht 1.499 m (4' 11)   Wt 54.6 kg (120 lb 5.9 oz)   SpO2 98%   BMI 24.31 kg/m     Physical Exam:    GENERAL :seated in bed, NAD, family at the bedside  HEENT: AT NC PEERLA   NECK: Supple no JVP  CVS: RRR  RS: CTABL, no rhonchi,or wheezing heard, no distress  ABDOMEN: soft NT ND positive BS, PEG  EXTREMITY: No edema clubbing or cyanosis, pedal pulse +  NEUROLOGY: AAA X3, no focal deficit or asterixis  HD access: L AVF    Laboratory Results:      Lab Results   Component Value Date/Time    NA 139 08/06/2024 03:54 PM    K 3.8 08/06/2024 03:54 PM    CL 94 08/06/2024 03:54 PM    CO2 29 08/06/2024 03:54 PM    BUN 41 08/06/2024 03:54 PM    CREATININE 5.77 08/06/2024 03:54 PM    GLUCOSE 190 08/06/2024 03:54 PM    CALCIUM  9.2 08/06/2024 03:54 PM          @LASTPROCAMB (POC81001;POC81003;poc81002;poc81000;POC81001F;POC81003E)@  No results found for: LABPROT, LABALBU     Lab Results   Component Value Date    WBC 10.2 08/06/2024    HGB 12.1 08/06/2024    HCT 35.1 08/06/2024    MCV 90.5 08/06/2024     PLT 405 (H) 08/06/2024    LYMPHOPCT 14.3 08/06/2024    RBC 3.88 08/06/2024    MCH 31.2 08/06/2024    MCHC 34.5 08/06/2024    RDW 14.6 (H) 08/06/2024                   Damien FORBES Leos, PA-C  08/07/2024    Thank you for consulting [facility] in the care of your patient.    [facility]:  [email or web address]  [email or web address]          "

## 2024-08-07 NOTE — Dialysis (Addendum)
"    Hep B verification performed for (RN): E. REYES  Hep B results, dates, and Primary Source: NEG/SUSC 07/13/2024 CWOW  Hepatitis B Surface Ag   Date/Time Value Ref Range Status   06/27/2024 01:36 PM NONREACTIVE NR   Final     Hep B S Ag Interp   Date/Time Value Ref Range Status   03/16/2023 08:19 AM Negative NEG   Final     Hep B S Ab   Date/Time Value Ref Range Status   06/28/2024 06:07 AM <3.50 mIU/mL Final     Comment:        <10.00 mIU/mL     Non-Immune  =>10.00 mIU/mL    Individual is considered to be immune to infection with HBV       Machine disinfect process: HEAT  "

## 2024-08-08 LAB — POCT GLUCOSE
POC Glucose: 181 mg/dL — ABNORMAL HIGH (ref 65–117)
POC Glucose: 114 mg/dL (ref 65–117)
POC Glucose: 94 mg/dL (ref 65–117)
POC Glucose: 187 mg/dL — ABNORMAL HIGH (ref 65–117)
POC Glucose: 243 mg/dL — ABNORMAL HIGH (ref 65–117)

## 2024-08-08 LAB — CBC WITH AUTO DIFFERENTIAL
Basophils %: 1 % (ref 0.0–1.0)
Basophils Absolute: 0.09 K/UL (ref 0.00–0.10)
Eosinophils %: 3.7 % (ref 0.0–7.0)
Eosinophils Absolute: 0.34 K/UL (ref 0.00–0.40)
Hematocrit: 31.1 % — ABNORMAL LOW (ref 35.0–47.0)
Hemoglobin: 9.7 g/dL — ABNORMAL LOW (ref 11.5–16.0)
Immature Granulocytes %: 0.2 % (ref 0.0–0.5)
Immature Granulocytes Absolute: 0.02 K/UL (ref 0.00–0.04)
Lymphocytes %: 17.2 % (ref 12.0–49.0)
Lymphocytes Absolute: 1.56 K/UL (ref 0.80–3.50)
MCH: 30 pg (ref 26.0–34.0)
MCHC: 31.2 g/dL (ref 30.0–36.5)
MCV: 96.3 FL (ref 80.0–99.0)
MPV: 8.8 FL — ABNORMAL LOW (ref 8.9–12.9)
Monocytes %: 11.2 % (ref 5.0–13.0)
Monocytes Absolute: 1.02 K/UL — ABNORMAL HIGH (ref 0.00–1.00)
Neutrophils %: 66.7 % (ref 32.0–75.0)
Neutrophils Absolute: 6.05 K/UL (ref 1.80–8.00)
Nucleated RBCs: 0 /100{WBCs}
Platelets: 314 K/uL (ref 150–400)
RBC: 3.23 M/uL — ABNORMAL LOW (ref 3.80–5.20)
RDW: 14.8 % — ABNORMAL HIGH (ref 11.5–14.5)
WBC: 9.1 K/uL (ref 3.6–11.0)
nRBC: 0 K/uL (ref 0.00–0.01)

## 2024-08-08 LAB — COMPREHENSIVE METABOLIC PANEL
ALT: 25 U/L (ref 10–35)
AST: 23 U/L (ref 10–35)
Albumin/Globulin Ratio: 0.9 — ABNORMAL LOW (ref 1.1–2.2)
Albumin: 3 g/dL — ABNORMAL LOW (ref 3.5–5.2)
Alk Phosphatase: 191 U/L — ABNORMAL HIGH (ref 35–104)
Anion Gap: 11 mmol/L (ref 2–14)
BUN/Creatinine Ratio: 4 — ABNORMAL LOW (ref 12–20)
BUN: 19 mg/dL (ref 6–20)
CO2: 29 mmol/L (ref 20–29)
Calcium: 8 mg/dL — ABNORMAL LOW (ref 8.6–10.0)
Chloride: 98 mmol/L (ref 98–107)
Creatinine: 4.43 mg/dL — ABNORMAL HIGH (ref 0.60–1.00)
Est, Glom Filt Rate: 12 ml/min/1.73m2 — ABNORMAL LOW (ref >59)
Globulin: 3.4 g/dL (ref 2.0–4.0)
Glucose: 122 mg/dL — ABNORMAL HIGH (ref 65–100)
Potassium: 4 mmol/L (ref 3.5–5.1)
Sodium: 137 mmol/L (ref 136–145)
Total Bilirubin: 0.4 mg/dL (ref 0.0–1.2)
Total Protein: 6.4 g/dL (ref 6.4–8.3)

## 2024-08-08 MED ORDER — ALBUMIN HUMAN 25 % IV SOLN
25 | INTRAVENOUS | Status: DC | PRN
Start: 2024-08-08 — End: 2024-08-28
  Administered 2024-08-10: 16:00:00 25 g via INTRAVENOUS

## 2024-08-08 MED ORDER — PROCHLORPERAZINE EDISYLATE 10 MG/2ML IJ SOLN
10 | Freq: Four times a day (QID) | INTRAMUSCULAR | Status: DC | PRN
Start: 2024-08-08 — End: 2024-08-20
  Administered 2024-08-08 – 2024-08-20 (×24): 10 mg via INTRAVENOUS

## 2024-08-08 MED ORDER — EPOETIN ALFA-EPBX 10000 UNIT/ML IJ SOLN
10000 | INTRAMUSCULAR | Status: DC
Start: 2024-08-08 — End: 2024-08-15
  Administered 2024-08-08 – 2024-08-13 (×3): 10000 [IU] via SUBCUTANEOUS

## 2024-08-08 MED ORDER — ERYTHROMYCIN LACTOBIONATE 500 MG IV SOLR
500 | Freq: Three times a day (TID) | INTRAVENOUS | Status: DC
Start: 2024-08-08 — End: 2024-08-08

## 2024-08-08 MED ORDER — VITAMIN B-6 50 MG PO TABS
50 | Freq: Four times a day (QID) | ORAL | Status: DC
Start: 2024-08-08 — End: 2024-08-15
  Administered 2024-08-08 – 2024-08-15 (×27): 25 mg via ORAL

## 2024-08-08 MED FILL — ALBUMIN HUMAN 25 % IV SOLN: 25 % | INTRAVENOUS | Qty: 100 | Fill #0

## 2024-08-08 MED FILL — SENNOSIDES-DOCUSATE SODIUM 8.6-50 MG PO TABS: 8.6-50 mg | ORAL | Qty: 1 | Fill #0

## 2024-08-08 MED FILL — METOCLOPRAMIDE HCL 5 MG/ML IJ SOLN: 5 mg/mL | INTRAMUSCULAR | Qty: 2 | Fill #0

## 2024-08-08 MED FILL — MORPHINE SULFATE 4 MG/ML IJ SOLN: 4 mg/mL | INTRAMUSCULAR | Qty: 1 | Fill #0

## 2024-08-08 MED FILL — ALBUMIN HUMAN 25 % IV SOLN: 25 % | INTRAVENOUS | Qty: 50 | Fill #0

## 2024-08-08 MED FILL — DULOXETINE HCL 30 MG PO CPEP: 30 mg | ORAL | Qty: 2 | Fill #0

## 2024-08-08 MED FILL — HEPARIN SODIUM (PORCINE) 5000 UNIT/ML IJ SOLN: 5000 [IU]/mL | INTRAMUSCULAR | Qty: 1 | Fill #0

## 2024-08-08 MED FILL — SODIUM BICARBONATE 650 MG PO TABS: 650 mg | ORAL | Qty: 1 | Fill #0

## 2024-08-08 MED FILL — HYDRALAZINE HCL 25 MG PO TABS: 25 mg | ORAL | Qty: 2 | Fill #0

## 2024-08-08 MED FILL — PROCHLORPERAZINE EDISYLATE 10 MG/2ML IJ SOLN: 10 MG/2ML | INTRAMUSCULAR | Qty: 2 | Fill #0

## 2024-08-08 MED FILL — AMLODIPINE BESYLATE 5 MG PO TABS: 5 mg | ORAL | Qty: 2 | Fill #0

## 2024-08-08 MED FILL — FAMOTIDINE 20 MG PO TABS: 20 mg | ORAL | Qty: 1 | Fill #0

## 2024-08-08 MED FILL — BUSPIRONE HCL 5 MG PO TABS: 5 mg | ORAL | Qty: 1 | Fill #0

## 2024-08-08 MED FILL — ERYTHROCIN LACTOBIONATE 500 MG IV SOLR: 500 mg | INTRAVENOUS | Qty: 163.8 | Fill #0

## 2024-08-08 MED FILL — CARVEDILOL 12.5 MG PO TABS: 12.5 mg | ORAL | Qty: 1 | Fill #0

## 2024-08-08 MED FILL — ASPIRIN 81 MG PO CHEW: 81 mg | ORAL | Qty: 1 | Fill #0

## 2024-08-08 MED FILL — CLOPIDOGREL BISULFATE 75 MG PO TABS: 75 mg | ORAL | Qty: 1 | Fill #0

## 2024-08-08 MED FILL — DIPHENHYDRAMINE HCL 50 MG/ML IJ SOLN: 50 mg/mL | INTRAMUSCULAR | Qty: 1 | Fill #0

## 2024-08-08 MED FILL — PANTOPRAZOLE SODIUM 40 MG IV SOLR: 40 mg | INTRAVENOUS | Qty: 40 | Fill #0

## 2024-08-08 MED FILL — ONDANSETRON HCL 4 MG/2ML IJ SOLN: 4 MG/2ML | INTRAMUSCULAR | Qty: 2 | Fill #0

## 2024-08-08 MED FILL — INSULIN LISPRO 100 UNIT/ML IJ SOLN: 100 [IU]/mL | INTRAMUSCULAR | Qty: 2 | Fill #0

## 2024-08-08 MED FILL — VITAMIN B-6 50 MG PO TABS: 50 mg | ORAL | Qty: 1 | Fill #0

## 2024-08-08 MED FILL — RETACRIT 10000 UNIT/ML IJ SOLN: 10000 [IU]/mL | INTRAMUSCULAR | Qty: 1 | Fill #0

## 2024-08-08 NOTE — Dialysis (Signed)
"  Hep B verification performed for (RN): T Jones  Hep B results, dates, and Primary Source: Negative/susceptible 08/07/2024 EPIC  Hepatitis B Surface Ag   Date/Time Value Ref Range Status   08/07/2024 01:38 AM NONREACTIVE NR   Final     Hep B S Ag Interp   Date/Time Value Ref Range Status   03/16/2023 08:19 AM Negative NEG   Final     Hep B S Ab   Date/Time Value Ref Range Status   08/07/2024 01:38 AM <3.50 mIU/mL Final     Comment:        <10.00 mIU/mL     Non-Immune  =>10.00 mIU/mL    Individual is considered to be immune to infection with HBV       Machine disinfect process:HEAT  "

## 2024-08-08 NOTE — Progress Notes (Addendum)
"  0700 Bedside and Verbal shift change report given to Harman Ferrin RN (oncoming nurse) by Avelina RN (offgoing nurse). Report included the following information Nurse Handoff Report, Recent Results, Cardiac Rhythm NSR-Sinus Tachy, Neuro Assessment, and Event Log.     0730 Patient off floor to dialysis suite.    1145 Patient back to floor following dialysis. BP 206/93, MD Lyell aware & at bedside. Per MD, RN to give morning BP medications.    1303 BP improved to 147/67 after medication administration.     1900 Bedside and Verbal shift change report given to Youth Worker control and instrumentation engineer) by Avagrace Botelho RN (offgoing nurse). Report included the following information Nurse Handoff Report, Recent Results, Cardiac Rhythm NSR, Neuro Assessment, and Event Log.     "

## 2024-08-08 NOTE — Progress Notes (Signed)
 "        NEPHROLOGY CONSULT NOTE     Patient: Melinda [name] MRN: [id number]  PCP: No primary care provider on file.   DOB:     January 05, 1981  Age:   43 y.o.  Sex:  female      Referring physician: Lyell, Prentice BIRCH, MD  Reason for consultation:   Admission Date: 08/06/2024  3:18 PM  LOS: 2 days        ASSESSMENT and PLAN :   ESRD on HD MWF via LAVF, [facility], f/b Dr. Kalvin  Hypertensive urgency-better after HD  DM  AOCD  Chronic abd pain, n/v from gastroparesis-reason for admit      Plan:  Seen on HD today, next HD Friday  Her UF is limited by soft pressures-she drops on HD  Bps often in the 200s at the start of HD  Would give BP meds after HD on HD days  Epo with HD  Supportive care for gastroparesis  Will follow along with you       Subjective:   HPI:   10/29: Seen on HD. Bps are actually low in the 90s. Has ongoing nausea, abd pain, back pain.     Melinda [name] is a 43 y.o. Hispanic female with ESRD on HD MWF at [facility], DM with gastroparesis, HTN presenting to the ED from HD for worsening abd pain, n/v. Has supplemental PEG, has been trying to sip on soup, uses PEG for meds. Missed HD yesterday, arrived with SBPs in the 200s. We are consulted for HD needs.     Had HD overnight, Bps are better. Pt states her nausea and abd pain have not improved.     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)     Gastroparesis     Gastroparesis     GERD (gastroesophageal reflux disease)     High cholesterol     Hypertension         Past Surgical Hx:     Past Surgical History:   Procedure Laterality Date    CAPSULE ENDOSCOPY N/A 01/20/2023    ESOPHAGEAL CAPSULE ENDOSCOPY remove at 1624PM performed by Madlyn Fendt, MD at [facility] ENDOSCOPY    CHOLECYSTECTOMY, LAPAROSCOPIC N/A 02/03/2023    ROBOTIC LAPAROSCOPIC CHOLECYSTECTOMY with Indocyanine green  performed by Chrystal Elijah BRAVO, MD at [facility] MAIN OR    COLONOSCOPY N/A  01/19/2023    COLONOSCOPY DIAGNOSTIC performed by Madlyn Fendt, MD at [facility] ENDOSCOPY    INVASIVE VASCULAR N/A 10/03/2023    Angiography visceral SMA performed by Millard Oneil LABOR, MD at [facility] CARDIAC CATH LAB    INVASIVE VASCULAR N/A 10/03/2023    Ultrasound guided vascular access performed by Millard Oneil LABOR, MD at [facility] CARDIAC CATH LAB    INVASIVE VASCULAR N/A 10/03/2023    Insert stent peripheral artery performed by Millard Oneil LABOR, MD at [facility] CARDIAC CATH LAB    IR NONTUNNELED VASCULAR CATHETER > 5 YEARS  10/10/2023    IR NONTUNNELED VASCULAR CATHETER > 5 YEARS 10/10/2023 [facility] CARDIAC CATH/EP/IR LAB    IR NONTUNNELED VASCULAR CATHETER > 5 YEARS  10/10/2023    IR NONTUNNELED VASCULAR CATHETER > 5 YEARS 10/10/2023 [facility] CARDIAC CATH/EP/IR LAB    OTHER SURGICAL HISTORY Left     Rentia attachment    TUBAL LIGATION Bilateral     UPPER  GASTROINTESTINAL ENDOSCOPY N/A 01/17/2023    ESOPHAGOGASTRODUODENOSCOPY performed by Madlyn Fendt, MD at [facility] ENDOSCOPY    UPPER GASTROINTESTINAL ENDOSCOPY N/A 01/17/2023    ESOPHAGOGASTRODUODENOSCOPY BIOPSY performed by Madlyn Fendt, MD at [facility] ENDOSCOPY    UPPER GASTROINTESTINAL ENDOSCOPY N/A 01/18/2023    ESOPHAGOGASTRODUODENOSCOPY performed by Madlyn Fendt, MD at [facility] ENDOSCOPY    UPPER GASTROINTESTINAL ENDOSCOPY N/A 05/13/2023    ESOPHAGOGASTRODUODENOSCOPY performed by Roseann Lonni BIRCH, MD at [facility] ENDOSCOPY    UPPER GASTROINTESTINAL ENDOSCOPY N/A 05/13/2023    ESOPHAGOGASTRODUODENOSCOPY BIOPSY performed by Roseann Lonni BIRCH, MD at [facility] ENDOSCOPY    UPPER GASTROINTESTINAL ENDOSCOPY N/A 09/21/2023    ESOPHAGOGASTRODUODENOSCOPY performed by Madlyn Fendt, MD at [facility] ENDOSCOPY    UPPER GASTROINTESTINAL ENDOSCOPY N/A 09/21/2023    ESOPHAGOGASTRODUODENOSCOPY BIOPSY performed by Madlyn Fendt, MD at [facility] ENDOSCOPY    US  FLUID COLLECTION DRAINAGE PERITONEAL/RETROPERITONEAL [facility]  02/11/2023    US  ABSCESS DRAINAGE PERITONEAL 02/11/2023 [facility] RAD US        Medications:  Prior to Admission medications   Medication Sig  Start Date End Date Taking? Authorizing Provider   metoclopramide  (REGLAN ) 5 MG tablet Take 1 tablet by mouth 3 times daily as needed (nausea/vomiting) 07/20/24 07/25/24  Cutchins, Abigail M, PA-C   lidocaine  4 % external patch Place 1 patch onto the skin daily 07/02/24   Karenann Barter, MD   sevelamer  (RENVELA ) 0.8 g PACK packet Take 1 packet by mouth 3 times daily (with meals) 07/02/24   Karenann Barter, MD   atorvastatin  (LIPITOR ) 80 MG tablet Take 1 tablet by mouth nightly    [provider]   carvedilol  (COREG ) 12.5 MG tablet Take 1 tablet by mouth 2 times daily (with meals)    [provider]   famotidine  (PEPCID ) 20 MG tablet Take 1 tablet by mouth 2 times daily    [provider]   hydrALAZINE  (APRESOLINE ) 50 MG tablet Take 1 tablet by mouth 3 times daily    [provider]   sodium bicarbonate  650 MG tablet Take 1 tablet by mouth 3 times daily 02/23/24   Sueellen Manuelita Bohr, MD   pantoprazole  (PROTONIX ) 40 MG tablet Take 1 tablet by mouth 2 times daily (before meals) 02/23/24   Muncy, Manuelita Bohr, MD   sennosides-docusate sodium  (SENOKOT-S) 8.6-50 MG tablet Take 1 tablet by mouth in the morning and at bedtime 11/06/23   Do, Khoi B, MD   ondansetron  (ZOFRAN -ODT) 4 MG disintegrating tablet Take 1 tablet by mouth every 8 hours as needed for Nausea or Vomiting 11/06/23   Do, Khoi B, MD   prochlorperazine  (COMPAZINE ) 10 MG tablet Take 1 tablet by mouth every 6 hours as needed (nausea or vomiting) 10/25/23   Orson Planas, MD   metFORMIN  (GLUCOPHAGE -XR) 500 MG extended release tablet Take 2 tablets by mouth daily (with breakfast)  Patient not taking: Reported on 06/26/2024 10/25/23   Aktig, Ziya, MD   busPIRone  (BUSPAR ) 15 MG tablet Take 15 mg by mouth 3 times daily  Patient taking differently: Take 5 mg by mouth in the morning and at bedtime 10/14/23   Tefera, Mesfin A, MD   DULoxetine  (CYMBALTA ) 60 MG extended release capsule Take 1 capsule by mouth daily 10/15/23   Tefera, Mesfin A,  MD   clopidogrel  (PLAVIX ) 75 MG tablet Take 1 tablet by mouth daily 10/15/23   Tefera, Mesfin A, MD   aspirin  81 MG chewable tablet Take 1 tablet by mouth daily 10/15/23   Tefera, Mesfin A, MD   amLODIPine  (  NORVASC ) 10 MG tablet Take 1 tablet by mouth daily 03/23/23   Do, Khoi B, MD       No Known Allergies    Social Hx:  reports that she has never smoked. She has never used smokeless tobacco. She reports that she does not drink alcohol and does not use drugs.     No family history on file.    Review of Systems:  A twelve point review of system was performed today. Pertinent positives and negatives are mentioned in the HPI. The reminder of the ROS is negative and noncontributory.     Objective:    Vitals:    Vitals:    08/08/24 1030 08/08/24 1043 08/08/24 1139 08/08/24 1200   BP: (!) 181/93 (!) 172/88 (!) 206/93 (!) 185/85   Pulse: (!) 102 100 (!) 108    Resp:   20    Temp:  98.4 F (36.9 C) 98.4 F (36.9 C)    TempSrc:   Oral    SpO2:  98% 94%    Weight:       Height:         I&O's:  10/28 0701 - 10/29 0700  In: 1090.1 [I.V.:1029.7]  Out: 160   BP (!) 185/85   Pulse (!) 108   Temp 98.4 F (36.9 C) (Oral)   Resp 20   Ht 1.499 m (4' 11.02)   Wt 54.6 kg (120 lb 5.9 oz)   SpO2 94%   BMI 24.30 kg/m     Physical Exam:    GENERAL :lying in bed, chronically ill, seen on HD  HEENT: AT NC PEERLA   NECK: Supple no JVP  CVS: RRR  RS: CTABL, no rhonchi,or wheezing heard, no distress  ABDOMEN: soft NT ND positive BS, PEG  EXTREMITY: No edema clubbing or cyanosis, pedal pulse +  NEUROLOGY: AAA X3, no focal deficit or asterixis  HD access: L AVF    Laboratory Results:      Lab Results   Component Value Date/Time    NA 137 08/08/2024 01:56 AM    K 4.0 08/08/2024 01:56 AM    CL 98 08/08/2024 01:56 AM    CO2 29 08/08/2024 01:56 AM    BUN 19 08/08/2024 01:56 AM    CREATININE 4.43 08/08/2024 01:56 AM    GLUCOSE 122 08/08/2024 01:56 AM    CALCIUM  8.0 08/08/2024 01:56 AM           @LASTPROCAMB (POC81001;POC81003;poc81002;poc81000;POC81001F;POC81003E)@  No results found for: LABPROT, LABALBU     Lab Results   Component Value Date    WBC 9.1 08/08/2024    HGB 9.7 (L) 08/08/2024    HCT 31.1 (L) 08/08/2024    MCV 96.3 08/08/2024    PLT 314 08/08/2024    LYMPHOPCT 17.2 08/08/2024    RBC 3.23 (L) 08/08/2024    MCH 30.0 08/08/2024    MCHC 31.2 08/08/2024    RDW 14.8 (H) 08/08/2024                   Damien FORBES Leos, PA-C  08/08/2024    Thank you for consulting [facility] in the care of your patient.    [facility]:  [email or web address]  [email or web address]          "

## 2024-08-08 NOTE — Plan of Care (Signed)
"    Problem: Chronic Conditions and Co-morbidities  Goal: Patient's chronic conditions and co-morbidity symptoms are monitored and maintained or improved  Outcome: Progressing     Problem: Discharge Planning  Goal: Discharge to home or other facility with appropriate resources  Outcome: Progressing     Problem: Pain  Goal: Verbalizes/displays adequate comfort level or baseline comfort level  Outcome: Progressing     Problem: Safety - Adult  Goal: Free from fall injury  Outcome: Progressing     Problem: Nutrition Deficit:  Goal: Optimize nutritional status  Outcome: Progressing     Problem: Gastrointestinal - Adult  Goal: Minimal or absence of nausea and vomiting  Outcome: Progressing  Goal: Maintains or returns to baseline bowel function  Outcome: Progressing  Goal: Maintains adequate nutritional intake  Outcome: Progressing     Problem: Skin/Tissue Integrity  Goal: Skin integrity remains intact  Description: 1.  Monitor for areas of redness and/or skin breakdown  2.  Assess vascular access sites hourly  3.  Every 4-6 hours minimum:  Change oxygen saturation probe site  4.  Every 4-6 hours:  If on nasal continuous positive airway pressure, respiratory therapy assess nares and determine need for appliance change or resting period  Outcome: Progressing     "

## 2024-08-08 NOTE — Progress Notes (Addendum)
 "[facility]  [address] [place], [place] [address]  [phone]        Hospitalist Progress Note      NAME: Melinda [name]   DOB:  [birth year]  MRM:  [id number]    Date/Time of service: 08/08/2024  3:34 PM       Subjective:   Patient was personally seen and examined by me during this time period.  Chart reviewed.     Patient reports that she is still vomiting very frequently.  Reports that she is overall slightly better compared to yesterday.  Denies ever smoking marijuana.  Does report that she had a marijuana pill once several months ago which caused the urine drug screen to be positive.    ROS: per history above       Objective:       Vitals:       Last 24hrs VS reviewed since prior progress note. Most recent are:    Vitals:    08/08/24 1531   BP: (!) 169/78   Pulse: 98   Resp: 18   Temp: 98.4 F (36.9 C)   SpO2: 98%     SpO2 Readings from Last 6 Encounters:   08/08/24 98%   07/20/24 94%   07/11/24 98%   07/09/24 98%   07/05/24 95%   07/02/24 95%          Intake/Output Summary (Last 24 hours) at 08/08/2024 1534  Last data filed at 08/08/2024 1043  Gross per 24 hour   Intake 500 ml   Output 1500 ml   Net -1000 ml        Exam:     Physical Exam:    Gen:  Well-developed, well-nourished, appears uncomfortable.  HEENT:  Pink conjunctivae, hearing intact to voice  Neck:  Supple  Resp:  No accessory muscle use, clear breath sounds without wheezes rales or rhonchi  Card:  No murmurs, normal S1, S2 without thrills, bruits or peripheral edema  Abd:  Soft, nontender, nondistended.  Skin:  No rashes or ulcers, skin turgor is good  Neuro:  Cranial nerves 3-12 are grossly intact, follows commands appropriately  Psych:  Good insight, oriented to person, place and time, alert      Medications Reviewed: (see below)    Lab Data Reviewed: (see below)    ______________________________________________________________________    Medications:     Current Facility-Administered Medications    Medication Dose Route Frequency    albumin  human 25% IV solution 25 g  25 g IntraVENous PRN    epoetin  alfa-epbx (RETACRIT ) injection 10,000 Units  10,000 Units SubCUTAneous Q MWF    0.9 % sodium chloride  infusion   IntraVENous Continuous    famotidine  (PEPCID ) tablet 20 mg  20 mg Oral Daily    diphenhydrAMINE  (BENADRYL ) injection 12.5 mg  12.5 mg IntraVENous Q8H PRN    busPIRone  (BUSPAR ) tablet 5 mg  5 mg Oral BID    diazePAM  (VALIUM ) injection 5 mg  5 mg IntraVENous Q4H PRN    sodium chloride  flush 0.9 % injection 5-40 mL  5-40 mL IntraVENous 2 times per day    sodium chloride  flush 0.9 % injection 5-40 mL  5-40 mL IntraVENous PRN    0.9 % sodium chloride  infusion   IntraVENous PRN    heparin  (porcine) injection 5,000 Units  5,000 Units SubCUTAneous 3 times per day    ondansetron  (ZOFRAN -ODT) disintegrating tablet 4 mg  4 mg Oral Q8H PRN  Or    ondansetron  (ZOFRAN ) injection 4 mg  4 mg IntraVENous Q6H PRN    polyethylene glycol (GLYCOLAX ) packet 17 g  17 g Oral Daily PRN    acetaminophen  (TYLENOL ) tablet 650 mg  650 mg Oral Q6H PRN    Or    acetaminophen  (TYLENOL ) suppository 650 mg  650 mg Rectal Q6H PRN    amLODIPine  (NORVASC ) tablet 10 mg  10 mg Oral Daily    aspirin  chewable tablet 81 mg  81 mg Oral Daily    atorvastatin  (LIPITOR ) tablet 80 mg  80 mg Oral QHS    carvedilol  (COREG ) tablet 12.5 mg  12.5 mg Oral BID WC    clopidogrel  (PLAVIX ) tablet 75 mg  75 mg Oral Daily    DULoxetine  (CYMBALTA ) extended release capsule 60 mg  60 mg Oral Daily    hydrALAZINE  (APRESOLINE ) tablet 50 mg  50 mg Oral TID    sennosides-docusate sodium  (SENOKOT-S) 8.6-50 MG tablet 1 tablet  1 tablet Oral BID    sevelamer  (RENVELA ) packet 0.8 g  0.8 g Oral TID WC    sodium bicarbonate  tablet 650 mg  650 mg Oral TID    oxyCODONE  (ROXICODONE ) immediate release tablet 5 mg  5 mg Oral Q4H PRN    Or    oxyCODONE  (ROXICODONE ) immediate release tablet 10 mg  10 mg Oral Q4H PRN    morphine  sulfate (PF) injection 2 mg  2 mg IntraVENous Q2H  PRN    Or    morphine  sulfate (PF) injection 4 mg  4 mg IntraVENous Q2H PRN    insulin  lispro (HUMALOG ,ADMELOG ) injection vial 0-8 Units  0-8 Units SubCUTAneous 4x Daily AC & HS    pantoprazole  (PROTONIX ) 40 mg in sodium chloride  (PF) 0.9 % 10 mL injection  40 mg IntraVENous Daily    metoclopramide  (REGLAN ) injection 5 mg  5 mg IntraVENous Q6H PRN          Lab Review:     Recent Labs     08/06/24  1554 08/07/24  1205 08/08/24  0156   WBC 10.2 8.4 9.1   HGB 12.1 11.5 9.7*   HCT 35.1 34.7* 31.1*   PLT 405* 307 314     Recent Labs     08/06/24  1554 08/07/24  1119 08/08/24  0156   NA 139 139 137   K 3.8 4.1 4.0   CL 94* 97* 98   CO2 29 30* 29   BUN 41* 14 19   ALT 48* 32 25     No results found for: GLUCPOC       Assessment / Plan:     Principal Problem:    Hypertensive emergency  Resolved Problems:    * No resolved hospital problems. *        43 y.o. female with past medical history of ESRD on HD MWF, DM2, HTN, gastroparesis requiring JG tube who presented to emergency room with severe abdominal pain that started yesterday     # Diffuse abdominal pain with nausea and vomiting: Acute, present admission.  Patient has been admitted repeatedly for this in the past.  Her symptoms previously have been attributed to gastroparesis.  Patient does have a GJ tube.  CT abdomen/pelvis without acute process.  -GI consult  - N.p.o. for bowel rest  - UDS pending  - Normal saline IV fluids as patient does make urine  -Patient has had no improvement with Reglan .  QTc is too prolonged to do erythromycin .  --  Stop Zofran , start Compazine   -- B6 25 mg every 6 hours  - Could also consider nortriptyline  -Pregnancy test negative  - IV Protonix   --TSH is mildly elevated this is unlikely to contribute to patient's symptoms.  Needs outpatient follow-up for this.    Prolonged Qtc: Repeat EKG today shows QTc of 502.  - Stop Zofran -start Compazine   - Will avoid erythromycin  for now  - Repeat EKG tomorrow      # Hypertension POA  Will continue  patient's home Coreg /hydralazine /amlodipine   Continue vitals per protocol     # #ESRD  On HD   Nephrology consulted    #GERD: Chronic.  Continue Pepcid .  IV Protonix  while NPO.     # T2DM with gastroparesis. POA, chronic.  Last A1c of 6.1 on 9/17.  Sliding scale insulin .    # S/p GJ tube- Required GJ tube placement for adequate nutrition.  Nutrition consulted, will hold tube feeds for now    Chronic pain or anxiety/depression?:  Unclear why on duloxetine  but will continue for now.  Continue BuSpar .    DAPT: Unclear why patient is on dual antiplatelet therapy.  Will continue for now.  May need to be titrated off outpatient    Anemia: Trend.  Follow-up outpatient       **Prior records, notes, labs, radiology, and medications reviewed in [facility] if needed**    Total time spent with patient care: 35 min **I personally saw and examined the patient during this time period**                 Care Plan discussed with: Patient and Family    Discussed:  Care Plan    Prophylaxis:  Hep SQ    Disposition:  Home w/Family           ___________________________________________________    Attending Physician: Prentice JONETTA Gave, MD    "

## 2024-08-08 NOTE — Flowsheet Note (Signed)
 "PRE TREATMENT   08/08/24 9286   Technical Checks   Dialysis Machine No. [id number]  [phone])   RO Machine Number [id number]   Dialyzer Lot No. [id number]   Tubing Lot Number [id number]   All Connections Secure Yes   NS Bag Yes   Saline Line Double Clamped Yes   Dialyzer F-180   Prime Volume (mL) 200 mL   RO Machine Log Sheet Completed Yes   Machine Alarm Self Test Completed;Passed   Child Psychotherapist Function;pH Reading   Sport And Exercise Psychologist Conductivity 13.8   Manual Ph 7.4   Bleach Test (Neg) Yes   Bath Temperature 98.6 F (37 C)   Citrate Bath   K+ (Potassium) 3   Ca+ (Calcium ) 2.5   Bicarb 38        08/08/24 0737   Observations & Evaluations   Level of Consciousness 0   Heart Rhythm Regular   Respiratory Quality/Effort Unlabored   O2 Device None (Room air)   Bilateral Breath Sounds Diminished   Skin Condition/Temp Dry;Warm   Abdomen Inspection Soft   Edema None   Vital Signs   BP (!) 176/91   Temp 98.7 F (37.1 C)   Pulse 99   Respirations 18   SpO2 93 %   Pain Assessment   Pain Assessment 0-10   Pain Level 0   Technical Checks   ICEBOAT I;C;E;B;O;A;T   Hemodialysis Fistula/Graft Arteriovenous fistula Left Arm   No placement date or time found.   Present on Admission/Arrival: Yes  Access Type: Arteriovenous fistula  Orientation: Left  Access Location: Arm   Site Assessment Clean, dry & intact   Thrill Present   Bruit Present   Status Accessed   Venous Needle Size 15 G   Arterial Needle Size 15 G   Accessed By: Waneta Molt, RN   Access Attempts  1   Access Interventions Chlorhexidine;Aseptic Technique;Needles taped to patient   Primary RN SBAR: Avelina Hamilton, RN  Incapacitated Nurse edu. provided: yes  Patient Education provided: hemodialysis procedural  Preferred Education method and Primary language: verbal; spanish; patient understands some English language  Dialysis consent: chronic patient  Hospital General Consent Verified: yes  Hospital associated wait time;  reason: none  Hepatitis B Surface Ag   Date/Time Value Ref Range Status   08/07/2024 01:38 AM NONREACTIVE NR   Final     Hep B S Ag Interp   Date/Time Value Ref Range Status   03/16/2023 08:19 AM Negative NEG   Final     Hep B S Ab   Date/Time Value Ref Range Status   08/07/2024 01:38 AM <3.50 mIU/mL Final     Comment:        <10.00 mIU/mL     Non-Immune  =>10.00 mIU/mL    Individual is considered to be immune to infection with HBV       Hep B results, dates, and Primary Source: HepBsAG Negative on 08/07/24; HepBsAB Susceptible on 08/07/24; Epic  Hep B dual verification performed by (RN): Ozell Shivers, RN  Machine disinfect process:heat    TREATMENT STARTED     08/08/24 0740   Treatment   Time On 0740   Treatment Goal 2 to 3 liters   Vital Signs   BP (!) 174/88   Pulse 98   Treatment Initiation   Dialyze Hours 3   Treatment  Initiation Universal Precautions maintained;Lines secured to patient;Connections secured;Prime given;Venous Parameters set;Arterial Parameters set;It consultant  engaged;Saline line double clamped;F180   During Hemodialysis Assessment   Blood Flow Rate (ml/min) 400 ml/min   Arterial Pressure (mmHg) -170 mmHg   Venous Pressure (mmHg) 170   TMP 20   DFR 600   Comments Treatment started   Access Visible Yes   Ultrafiltration Rate (ml/hr) 830 ml/hr   Ultrafiltration Removed (ml) 0 ml        08/08/24 0834   During Hemodialysis Assessment   Comments Albumin  started      08/08/24 0845   During Hemodialysis Assessment   BP 100/65   Pulse 92   Blood Flow Rate (ml/min) 400 ml/min   Arterial Pressure (mmHg) -200 mmHg   Venous Pressure (mmHg) 180   TMP 10   DFR 600   Comments decreased uf goal; uf off   Access Visible Yes   Ultrafiltration Rate (ml/hr) 830 ml/hr   Ultrafiltration Removed (ml) 883 ml      08/08/24 0900   During Hemodialysis Assessment   BP 109/65   Pulse 92   Blood Flow Rate (ml/min) 400 ml/min   Arterial Pressure (mmHg) -190 mmHg   Venous Pressure (mmHg) 170   TMP 10   DFR 600    Comments uf back on; net uf goal decreased to 1.5 liters; Albumin  on  going   Access Visible Yes   Ultrafiltration Rate (ml/hr) 680 ml/hr   Ultrafiltration Removed (ml) 883 ml      08/08/24 0915   During Hemodialysis Assessment   BP 95/62   Pulse 91   Blood Flow Rate (ml/min) 400 ml/min   Arterial Pressure (mmHg) -1200 mmHg   Venous Pressure (mmHg) 170   TMP 10   DFR 600   Comments uf off; patient asymptomatic   Access Visible Yes   Ultrafiltration Rate (ml/hr) 680 ml/hr   Ultrafiltration Removed (ml) 1027 ml      08/08/24 0930   During Hemodialysis Assessment   BP (!) 142/70   Pulse 92   Blood Flow Rate (ml/min) 400 ml/min   Arterial Pressure (mmHg) -190 mmHg   Venous Pressure (mmHg) 170   TMP 10   DFR 600   Comments bp improved; uf back on   Access Visible Yes   Ultrafiltration Rate (ml/hr) 680 ml/hr   Ultrafiltration Removed (ml) 1027 ml     POST TREATMENT   08/08/24 1043   Treatment   Time Off 1043   Observations & Evaluations   Level of Consciousness 0   Heart Rhythm Regular   Respiratory Quality/Effort Unlabored   O2 Device None (Room air)   Bilateral Breath Sounds Diminished   Skin Condition/Temp Dry;Warm   Abdomen Inspection Soft   Edema None   Vital Signs   BP (!) 172/88   Temp 98.4 F (36.9 C)   Pulse 100   SpO2 98 %   Pain Assessment   Pain Assessment 0-10   Pain Level 0   During Hemodialysis Assessment   Blood Flow Rate (ml/min) 400 ml/min   Arterial Pressure (mmHg) -170 mmHg   Venous Pressure (mmHg) 190   TMP 10   DFR 600   Comments Treatment completed   Access Visible Yes   Ultrafiltration Rate (ml/hr) 410 ml/hr   Ultrafiltration Removed (ml) 1500 ml   Hemodialysis Fistula/Graft Arteriovenous fistula Left Arm   No placement date or time found.   Present on Admission/Arrival: Yes  Access Type: Arteriovenous fistula  Orientation: Left  Access Location: Arm   Site Assessment Clean, dry & intact  Thrill Present   Bruit Present   Status Deaccessed   Date of Last Dressing Change 08/08/24   Dressing  Intervention New;Dressing changed   Dressing Status Clean, dry & intact   Post-Hemodialysis Assessment   Post-Treatment Procedures Blood returned;Access bleeding time < 10 minutes   Control And Instrumentation Engineer Disinfection;Acid/Vinegar Clean;Heat Disinfect   Rinseback Volume (ml) 300 ml   Blood Volume Processed (Liters) 67.5 L   Dialyzer Clearance Lightly streaked   Duration of Treatment (minutes) 180 minutes   Heparin  Amount Administered During Treatment (mL) 0 mL   Hemodialysis Intake (ml) 500 ml   Hemodialysis Output (ml) 1500 ml   NET Removed (ml) 1000   Tolerated Treatment Fair   Interventions Taken Medication  (Albumin  X1)   Patient Response to Treatment Patient able to complete treatment   Patient Disposition Return to room  (306)   Primary RN SBAR: April Mathes, RN  Comments: low bp limited uf goal; Damien Leos, PA-C made aware uf goal changed to 1 to 1.5 Liters today  "

## 2024-08-08 NOTE — Progress Notes (Signed)
"  1900 Bedside shift change report given to Reche PEAK (oncoming nurse) by April RN (offgoing nurse). Report included the following information Nurse Handoff Report, Intake/Output, MAR, Cardiac Rhythm NS, and Neuro Assessment.      2034 Pt awake and alert and oriented x4.  Pt endorses back and abdominal pain.    2145 Pain medication given.    9487 NP notified that pt's WBC increased, orders received.      0602 Blood cultures and labs sent  "

## 2024-08-08 NOTE — Plan of Care (Signed)
"    Problem: Chronic Conditions and Co-morbidities  Goal: Patient's chronic conditions and co-morbidity symptoms are monitored and maintained or improved  08/08/2024 1034 by Augustina Earing, RN  Outcome: Progressing  08/08/2024 0504 by Glendia Macintosh, RN  Outcome: Progressing     Problem: Discharge Planning  Goal: Discharge to home or other facility with appropriate resources  08/08/2024 1034 by Augustina Earing, RN  Outcome: Progressing  08/08/2024 0504 by Glendia Macintosh, RN  Outcome: Progressing     Problem: Pain  Goal: Verbalizes/displays adequate comfort level or baseline comfort level  08/08/2024 1034 by Augustina Earing, RN  Outcome: Progressing  08/08/2024 0504 by Glendia Macintosh, RN  Outcome: Progressing     Problem: Safety - Adult  Goal: Free from fall injury  08/08/2024 1034 by Augustina, Vadie Principato, RN  Outcome: Progressing  08/08/2024 0504 by Glendia Macintosh, RN  Outcome: Progressing     Problem: Nutrition Deficit:  Goal: Optimize nutritional status  08/08/2024 1034 by Augustina Earing, RN  Outcome: Progressing  08/08/2024 0504 by Glendia Macintosh, RN  Outcome: Progressing     Problem: Gastrointestinal - Adult  Goal: Minimal or absence of nausea and vomiting  08/08/2024 1034 by Augustina Earing, RN  Outcome: Progressing  08/08/2024 0504 by Glendia Macintosh, RN  Outcome: Progressing  Goal: Maintains or returns to baseline bowel function  08/08/2024 1034 by Augustina Earing, RN  Outcome: Progressing  08/08/2024 0504 by Glendia Macintosh, RN  Outcome: Progressing  Goal: Maintains adequate nutritional intake  08/08/2024 1034 by Augustina Earing, RN  Outcome: Progressing  08/08/2024 0504 by Glendia Macintosh, RN  Outcome: Progressing     Problem: Skin/Tissue Integrity  Goal: Skin integrity remains intact  Description: 1.  Monitor for areas of redness and/or skin breakdown  2.  Assess vascular access sites hourly  3.  Every 4-6 hours minimum:  Change oxygen saturation probe site  4.  Every 4-6 hours:  If on nasal continuous positive airway  pressure, respiratory therapy assess nares and determine need for appliance change or resting period  08/08/2024 1034 by Augustina Earing, RN  Outcome: Progressing  08/08/2024 0504 by Glendia Macintosh, RN  Outcome: Progressing     "

## 2024-08-08 NOTE — Care Coordination (Signed)
"  Care Management Progress Note    Reason for Admission:   Hypertensive emergency [I16.1]  Intractable nausea and vomiting [R11.2]         Patient Admission Status: Inpatient  RUR:   Hospitalization in the last 30 days (Readmission):  No        Transition of care plan:  Medical - ongoing medical management, nephro following  DC plan - home with family and continued OP dialysis with [facility] in [place], clinicals sent via [facility]  Discharge plan communicated with patient and/or discharge caregiver: Yes    Date 1st IMM letter given:   Outpatient follow-up - per medical team  Transport at discharge:  Family  "

## 2024-08-09 ENCOUNTER — Inpatient Hospital Stay: Admit: 2024-08-09 | Payer: PRIVATE HEALTH INSURANCE

## 2024-08-09 LAB — EKG 12-LEAD
Atrial Rate: 97 {beats}/min
Atrial Rate: 101 {beats}/min
Diagnosis: NORMAL
P Axis: 42 degrees
P Axis: 74 degrees
P-R Interval: 150 ms
P-R Interval: 152 ms
Q-T Interval: 402 ms
Q-T Interval: 388 ms
QRS Duration: 90 ms
QRS Duration: 88 ms
QTc Calculation (Bazett): 510 ms
QTc Calculation (Bazett): 503 ms
R Axis: -73 degrees
R Axis: -78 degrees
T Axis: 244 degrees
T Axis: 109 degrees
Ventricular Rate: 97 {beats}/min
Ventricular Rate: 101 {beats}/min

## 2024-08-09 LAB — CBC WITH AUTO DIFFERENTIAL
Basophils %: 0.4 % (ref 0.0–1.0)
Basophils Absolute: 0.07 K/UL (ref 0.00–0.10)
Eosinophils %: 2.6 % (ref 0.0–7.0)
Eosinophils Absolute: 0.42 K/UL — ABNORMAL HIGH (ref 0.00–0.40)
Hematocrit: 31.8 % — ABNORMAL LOW (ref 35.0–47.0)
Hemoglobin: 10.1 g/dL — ABNORMAL LOW (ref 11.5–16.0)
Immature Granulocytes %: 0.4 % (ref 0.0–0.5)
Immature Granulocytes Absolute: 0.07 K/UL — ABNORMAL HIGH (ref 0.00–0.04)
Lymphocytes %: 7.9 % — ABNORMAL LOW (ref 12.0–49.0)
Lymphocytes Absolute: 1.26 K/UL (ref 0.80–3.50)
MCH: 30.8 pg (ref 26.0–34.0)
MCHC: 31.8 g/dL (ref 30.0–36.5)
MCV: 97 FL (ref 80.0–99.0)
MPV: 9.1 FL (ref 8.9–12.9)
Monocytes %: 7.8 % (ref 5.0–13.0)
Monocytes Absolute: 1.24 K/UL — ABNORMAL HIGH (ref 0.00–1.00)
Neutrophils %: 80.9 % — ABNORMAL HIGH (ref 32.0–75.0)
Neutrophils Absolute: 12.8 K/UL — ABNORMAL HIGH (ref 1.80–8.00)
Nucleated RBCs: 0 /100{WBCs}
Platelets: 275 K/uL (ref 150–400)
RBC: 3.28 M/uL — ABNORMAL LOW (ref 3.80–5.20)
RDW: 14.3 % (ref 11.5–14.5)
WBC: 15.9 K/uL — ABNORMAL HIGH (ref 3.6–11.0)
nRBC: 0 K/uL (ref 0.00–0.01)

## 2024-08-09 LAB — URINALYSIS WITH REFLEX TO CULTURE
Bilirubin, Urine: NEGATIVE
Glucose, Ur: NEGATIVE mg/dL
Nitrite, Urine: NEGATIVE
Protein, UA: >300 mg/dL — AB
Specific Gravity, UA: 1.023 (ref 1.003–1.030)
Urobilinogen, Urine: 0.2 EU/dL (ref 0.2–1.0)
WBC, UA: >100 /HPF — ABNORMAL HIGH (ref 0–4)
pH, Urine: 7 (ref 5.0–8.0)

## 2024-08-09 LAB — LACTIC ACID: Lactic Acid: 0.5 mmol/L (ref 0.5–2.0)

## 2024-08-09 LAB — COMPREHENSIVE METABOLIC PANEL
ALT: 33 U/L (ref 10–35)
AST: 32 U/L (ref 10–35)
Albumin/Globulin Ratio: 1.1 (ref 1.1–2.2)
Albumin: 3.1 g/dL — ABNORMAL LOW (ref 3.5–5.2)
Alk Phosphatase: 190 U/L — ABNORMAL HIGH (ref 35–104)
Anion Gap: 11 mmol/L (ref 2–14)
BUN/Creatinine Ratio: 3 — ABNORMAL LOW (ref 12–20)
BUN: 11 mg/dL (ref 6–20)
CO2: 28 mmol/L (ref 20–29)
Calcium: 8.2 mg/dL — ABNORMAL LOW (ref 8.6–10.0)
Chloride: 96 mmol/L — ABNORMAL LOW (ref 98–107)
Creatinine: 3.47 mg/dL — ABNORMAL HIGH (ref 0.60–1.00)
Est, Glom Filt Rate: 16 ml/min/1.73m2 — ABNORMAL LOW (ref >59)
Globulin: 2.9 g/dL (ref 2.0–4.0)
Glucose: 140 mg/dL — ABNORMAL HIGH (ref 65–100)
Potassium: 3.7 mmol/L (ref 3.5–5.1)
Sodium: 135 mmol/L — ABNORMAL LOW (ref 136–145)
Total Bilirubin: 0.5 mg/dL (ref 0.0–1.2)
Total Protein: 6 g/dL — ABNORMAL LOW (ref 6.4–8.3)

## 2024-08-09 LAB — URINE DRUG SCREEN
Amphetamine, Urine: NEGATIVE
Barbiturates, Urine: NEGATIVE
Benzodiazepines, Urine: POSITIVE — AB
Cocaine, Urine: NEGATIVE
Fentanyl: POSITIVE — AB
Methadone, Urine: NEGATIVE
Opiates, Urine: POSITIVE — AB
Phencyclidine, Urine: NEGATIVE
THC, TH-Cannabinol, Urine: NEGATIVE

## 2024-08-09 LAB — PROCALCITONIN: Procalcitonin: 0.16 ng/mL

## 2024-08-09 LAB — POCT GLUCOSE
POC Glucose: 124 mg/dL — ABNORMAL HIGH (ref 65–117)
POC Glucose: 166 mg/dL — ABNORMAL HIGH (ref 65–117)
POC Glucose: 150 mg/dL — ABNORMAL HIGH (ref 65–117)
POC Glucose: 131 mg/dL — ABNORMAL HIGH (ref 65–117)

## 2024-08-09 LAB — MITOCHONDRIAL ANTIBODIES, M2: Mitochondrial M2 Ab, IgG: <20 U (ref 0.0–20.0)

## 2024-08-09 MED ORDER — CEFTRIAXONE SODIUM 2 G IJ SOLR
2 | INTRAMUSCULAR | Status: DC
Start: 2024-08-09 — End: 2024-08-10
  Administered 2024-08-09: 19:00:00 2000 mg via INTRAVENOUS

## 2024-08-09 MED ORDER — CEFTRIAXONE SODIUM 1 G IJ SOLR
1 | INTRAMUSCULAR | Status: DC
Start: 2024-08-09 — End: 2024-08-09

## 2024-08-09 MED FILL — HYDRALAZINE HCL 25 MG PO TABS: 25 mg | ORAL | Qty: 2 | Fill #0

## 2024-08-09 MED FILL — SODIUM BICARBONATE 650 MG PO TABS: 650 mg | ORAL | Qty: 1 | Fill #0

## 2024-08-09 MED FILL — DIPHENHYDRAMINE HCL 50 MG/ML IJ SOLN: 50 mg/mL | INTRAMUSCULAR | Qty: 1 | Fill #0

## 2024-08-09 MED FILL — MORPHINE SULFATE 4 MG/ML IJ SOLN: 4 mg/mL | INTRAMUSCULAR | Qty: 1 | Fill #0

## 2024-08-09 MED FILL — AMLODIPINE BESYLATE 5 MG PO TABS: 5 mg | ORAL | Qty: 2 | Fill #0

## 2024-08-09 MED FILL — BUSPIRONE HCL 5 MG PO TABS: 5 mg | ORAL | Qty: 1 | Fill #0

## 2024-08-09 MED FILL — CLOPIDOGREL BISULFATE 75 MG PO TABS: 75 mg | ORAL | Qty: 1 | Fill #0

## 2024-08-09 MED FILL — SENNOSIDES-DOCUSATE SODIUM 8.6-50 MG PO TABS: 8.6-50 mg | ORAL | Qty: 1 | Fill #0

## 2024-08-09 MED FILL — PROCHLORPERAZINE EDISYLATE 10 MG/2ML IJ SOLN: 10 MG/2ML | INTRAMUSCULAR | Qty: 2 | Fill #0

## 2024-08-09 MED FILL — ASPIRIN 81 MG PO CHEW: 81 mg | ORAL | Qty: 1 | Fill #0

## 2024-08-09 MED FILL — VITAMIN B-6 50 MG PO TABS: 50 mg | ORAL | Qty: 1 | Fill #0

## 2024-08-09 MED FILL — ATORVASTATIN CALCIUM 20 MG PO TABS: 20 mg | ORAL | Qty: 4 | Fill #0

## 2024-08-09 MED FILL — CEFTRIAXONE SODIUM 2 G IJ SOLR: 2 g | INTRAMUSCULAR | Qty: 2000 | Fill #0

## 2024-08-09 MED FILL — HEPARIN SODIUM (PORCINE) 5000 UNIT/ML IJ SOLN: 5000 [IU]/mL | INTRAMUSCULAR | Qty: 1 | Fill #0

## 2024-08-09 MED FILL — CARVEDILOL 12.5 MG PO TABS: 12.5 mg | ORAL | Qty: 1 | Fill #0

## 2024-08-09 MED FILL — DULOXETINE HCL 30 MG PO CPEP: 30 mg | ORAL | Qty: 2 | Fill #0

## 2024-08-09 MED FILL — FAMOTIDINE 20 MG PO TABS: 20 mg | ORAL | Qty: 1 | Fill #0

## 2024-08-09 MED FILL — PANTOPRAZOLE SODIUM 40 MG IV SOLR: 40 mg | INTRAVENOUS | Qty: 40 | Fill #0

## 2024-08-09 NOTE — Progress Notes (Signed)
"    Pharmacy Note - Dose adjustment for ceftriaxone  per P&T approved protocol.  Ceftriaxone  ordered for treatment of UTI. Dose has been adjusted from 2000 mg to 1000 mg based on ceftriaxone  dose adjustment policy.  Body mass index is 24.3 kg/m.  Exclusion Criteria:  Doses ordered by an infectious disease provider. If appropriate, the pharmacist will contact the provider with recommendations.  Pediatric patients.  Doses ordered for surgical prophylaxis.  Ceftriaxone  Dosing Chart:  Note: Pharmacist to automatically order ceftriaxone  2000 mg dose for patients with BMI >= 30 kg/m2 for all indications except gonorrhea.  Site/Type of Infection Dose and Route Frequency   Bacteremia 2000 mg IV Every 24 hours   Critically Ill 2000 mg IV Every 24 hours   Endocarditis (Enterococcus synergy) 2000 mg IV Every 12 hours   Endocarditis (non-Enterococcus synergy) 2000 mg IV Every 24 hours   Intra-abdominal infection 2000 mg IV Every 24 hours   Spontaneous bacterial peritonitis treatment 2000 mg IV Every 24 hours   Spontaneous bacterial peritonitis prophylaxis 1000 mg IV Every 24 hours   Meningitis and CNS infections 2000 mg IV Every 12 hours   Osteomyelitis/septic arthritis  2000 mg IV Every 24 hours   Prosthetic joint infections 2000 mg IV Every 24 hours   Skin and soft tissue infections 1000 mg IV Every 24 hours   Pneumonia  1000 mg IV  Every 24 hours   Urinary tract infections 1000 mg IV Every 24 hours   Gonorrhea < 150 kg 500 mg IM ONCE   Gonorrhea >= 150 kg 1000 mg IM ONCE   Uncomplicated disseminated gonorrhea 1000 mg IV or IM Every 24 hours     Please call pharmacy with any questions.    Thank you,  OLIVA TORIBIO PASTOR, [facility]  08/09/2024 1:26 PM   "

## 2024-08-09 NOTE — Progress Notes (Addendum)
"  0700 Bedside and Verbal shift change report given to Edom Schmuhl RN (oncoming nurse) by Reche RN (offgoing nurse). Report included the following information Nurse Handoff Report, Recent Results, Cardiac Rhythm NSR- Sinus Tachy, Neuro Assessment, and Event Log.     1900 Bedside and Verbal shift change report given to Reche RN (oncoming nurse) by Chestina Komatsu RN (offgoing nurse). Report included the following information Nurse Handoff Report, Recent Results, Cardiac Rhythm NSR, Neuro Assessment, and Event Log.     "

## 2024-08-09 NOTE — Consults (Signed)
 Session ID: [id number]  Session Duration: 7 minutes  Language: Spanish  Interpreter ID: #[id number]  Interpreter Name: Nils

## 2024-08-09 NOTE — Progress Notes (Addendum)
"  Interpreter rounded remotely and discussed patient's language needs with April, RN .  Resources are being used to patient's satisfaction. Interpreter services, Navigation assistance and direct phone number was provided.      Dwayne Arla Mosses [place] Certified Spanish Medical Interpreter  [phone]    To reach the main line at [facility], please call  [phone], or email us  at: [email or web address]       "

## 2024-08-09 NOTE — Progress Notes (Signed)
"  Next HD tomorrow  Bps are labile  Labs reviewed-hypotension with HD limits UF  Call for qs  "

## 2024-08-09 NOTE — Flowsheet Note (Signed)
"  WBC 15.9 on AM labs. Continued tachycardia. CT AP w/ diverticulosis. Concern for sepsis. Check lactic acid and procal. Blood cultures collected. Consider repeat CT AP. Continue to monitor.  "

## 2024-08-09 NOTE — Progress Notes (Addendum)
 "[facility]  [address] [place], [place] [address]  [phone]        Hospitalist Progress Note      NAME: Melinda [name]   DOB:  Sep 20, 1981  MRM:  [id number]    Date/Time of service: 08/09/2024  1:22 PM       Subjective:   Patient was personally seen and examined by me during this time period.  Chart reviewed.     Patient still reports that she is vomiting frequently, but possibly a slight improved. Reports her abdominal pain is the same.    ROS: per history above       Objective:       Vitals:       Last 24hrs VS reviewed since prior progress note. Most recent are:    Vitals:    08/09/24 1242   BP:    Pulse:    Resp: 20   Temp:    SpO2:      SpO2 Readings from Last 6 Encounters:   08/09/24 93%   07/20/24 94%   07/11/24 98%   07/09/24 98%   07/05/24 95%   07/02/24 95%        No intake or output data in the 24 hours ending 08/09/24 1322       Exam:     Physical Exam:    Gen:  Well-developed, well-nourished, appears uncomfortable.  HEENT:  Pink conjunctivae, hearing intact to voice  Neck:  Supple  Resp:  No accessory muscle use, clear breath sounds without wheezes rales or rhonchi  Card:  No murmurs, normal S1, S2 without thrills, bruits or peripheral edema  Abd:  Soft, mild diffuse tenderness similar to yesterday.  Nondistended  Skin:  No rashes or ulcers, skin turgor is good  Neuro:  Cranial nerves 3-12 are grossly intact, follows commands appropriately  Psych:  Good insight, oriented to person, place and time, alert      Medications Reviewed: (see below)    Lab Data Reviewed: (see below)    ______________________________________________________________________    Medications:     Current Facility-Administered Medications   Medication Dose Route Frequency    cefTRIAXone  (ROCEPHIN ) 2,000 mg in sterile water  20 mL IV syringe  2,000 mg IntraVENous Q24H    albumin  human 25% IV solution 25 g  25 g IntraVENous PRN    epoetin  alfa-epbx (RETACRIT ) injection 10,000 Units  10,000  Units SubCUTAneous Q MWF    vitamin B-6 (PYRIDOXINE ) tablet 25 mg  25 mg Oral Q6H    prochlorperazine  (COMPAZINE ) injection 10 mg  10 mg IntraVENous Q6H PRN    0.9 % sodium chloride  infusion   IntraVENous Continuous    famotidine  (PEPCID ) tablet 20 mg  20 mg Oral Daily    diphenhydrAMINE  (BENADRYL ) injection 12.5 mg  12.5 mg IntraVENous Q8H PRN    busPIRone  (BUSPAR ) tablet 5 mg  5 mg Oral BID    diazePAM  (VALIUM ) injection 5 mg  5 mg IntraVENous Q4H PRN    sodium chloride  flush 0.9 % injection 5-40 mL  5-40 mL IntraVENous 2 times per day    sodium chloride  flush 0.9 % injection 5-40 mL  5-40 mL IntraVENous PRN    0.9 % sodium chloride  infusion   IntraVENous PRN    heparin  (porcine) injection 5,000 Units  5,000 Units SubCUTAneous 3 times per day    polyethylene glycol (GLYCOLAX ) packet 17 g  17 g Oral Daily PRN    acetaminophen  (TYLENOL ) tablet  650 mg  650 mg Oral Q6H PRN    Or    acetaminophen  (TYLENOL ) suppository 650 mg  650 mg Rectal Q6H PRN    amLODIPine  (NORVASC ) tablet 10 mg  10 mg Oral Daily    aspirin  chewable tablet 81 mg  81 mg Oral Daily    atorvastatin  (LIPITOR ) tablet 80 mg  80 mg Oral QHS    carvedilol  (COREG ) tablet 12.5 mg  12.5 mg Oral BID WC    clopidogrel  (PLAVIX ) tablet 75 mg  75 mg Oral Daily    DULoxetine  (CYMBALTA ) extended release capsule 60 mg  60 mg Oral Daily    hydrALAZINE  (APRESOLINE ) tablet 50 mg  50 mg Oral TID    sennosides-docusate sodium  (SENOKOT-S) 8.6-50 MG tablet 1 tablet  1 tablet Oral BID    sevelamer  (RENVELA ) packet 0.8 g  0.8 g Oral TID WC    sodium bicarbonate  tablet 650 mg  650 mg Oral TID    oxyCODONE  (ROXICODONE ) immediate release tablet 5 mg  5 mg Oral Q4H PRN    Or    oxyCODONE  (ROXICODONE ) immediate release tablet 10 mg  10 mg Oral Q4H PRN    morphine  sulfate (PF) injection 2 mg  2 mg IntraVENous Q2H PRN    Or    morphine  sulfate (PF) injection 4 mg  4 mg IntraVENous Q2H PRN    insulin  lispro (HUMALOG ,ADMELOG ) injection vial 0-8 Units  0-8 Units SubCUTAneous 4x Daily  AC & HS    pantoprazole  (PROTONIX ) 40 mg in sodium chloride  (PF) 0.9 % 10 mL injection  40 mg IntraVENous Daily          Lab Review:     Recent Labs     08/07/24  1205 08/08/24  0156 08/09/24  0159   WBC 8.4 9.1 15.9*   HGB 11.5 9.7* 10.1*   HCT 34.7* 31.1* 31.8*   PLT 307 314 275     Recent Labs     08/07/24  1119 08/08/24  0156 08/09/24  0159   NA 139 137 135*   K 4.1 4.0 3.7   CL 97* 98 96*   CO2 30* 29 28   BUN 14 19 11    ALT 32 25 33     No results found for: GLUCPOC       Assessment / Plan:     Principal Problem:    Hypertensive emergency  Resolved Problems:    * No resolved hospital problems. *        43 y.o. female with past medical history of ESRD on HD MWF, DM2, HTN, gastroparesis requiring JG tube who presented to emergency room with severe abdominal pain that started yesterday     # Diffuse abdominal pain with nausea and vomiting: Acute, present admission.  Patient has been admitted repeatedly for this in the past.  Her symptoms previously have been attributed to gastroparesis.  Patient does have a GJ tube.  CT abdomen/pelvis without acute process.  -GI consult  - N.p.o. for bowel rest  - UDS does show fentanyl  positive however patient denies drug use and Benadryl  can cause a false positive  - Normal saline IV fluids as patient does make urine  -Patient has had no improvement with Reglan .  QTc is too prolonged to do erythromycin .  -- Stop Zofran , start Compazine   -- B6 25 mg every 6 hours  --Benadryl  does seem to be helping some  - Could also consider nortriptyline though this is a slight risk of raising  Qtc  -Pregnancy test negative  - IV Protonix   --TSH is mildly elevated this is unlikely to contribute to patient's symptoms.  Needs outpatient follow-up for this.    Prolonged Qtc: Repeat EKG today shows QTc of 502.  - Stop Zofran -start Compazine   - Will avoid erythromycin  for now  - Repeat EKG today still shows elevated QTc    Sepsis: Acute, not present on admission.  Possibly secondary to UTI as  urinalysis positive.  Alternatively could be from aspiration pneumonia in the setting of repeated emesis though chest x-ray negative.  - Start Rocephin  2 g daily  -lactic acid negative  - Blood cultures pending  - Low threshold to obtain a repeat CT abdomen pelvis but deferred today as abdominal pain is unchanged and patient is relatively young and patient has already had a CT abdomen pelvis this admission.      # Hypertension POA  Will continue patient's home Coreg /hydralazine /amlodipine   Continue vitals per protocol     # #ESRD  On HD   Nephrology consulted    #GERD: Chronic.  Continue Pepcid .  IV Protonix  while NPO.     # T2DM with gastroparesis. POA, chronic.  Last A1c of 6.1 on 9/17.  Sliding scale insulin .    # S/p GJ tube- Required GJ tube placement for adequate nutrition.  Nutrition consulted, will hold tube feeds for now    Chronic pain or anxiety/depression?:  Unclear why on duloxetine  but will continue for now.  Continue BuSpar .    DAPT: Unclear why patient is on dual antiplatelet therapy.  Will continue for now.  May need to be titrated off outpatient    Anemia: Trend.  Follow-up outpatient       **Prior records, notes, labs, radiology, and medications reviewed in [facility] if needed**    Total time spent with patient care: 35 min **I personally saw and examined the patient during this time period**                 Care Plan discussed with: Patient    Discussed:  Care Plan    Prophylaxis:  Hep SQ    Disposition:  Home w/Family           ___________________________________________________    Attending Physician: Prentice JONETTA Gave, MD    "

## 2024-08-09 NOTE — Progress Notes (Signed)
 "                                                                                                 Melinda Peach, PA-C                       [phone] office             Monday-Friday 8:00 am-4:30 pm  I am not permitted to use perfect serve use above for contact, thanks.        Gastroenterology Progress Note    August 09, 2024  Admit Date: 08/06/2024         Narrative Assessment and Plan   43 year old female well-known to GI with prior history of intractable nausea and vomiting found to have gastroparesis, inability to reliably obtain prokinetic and other GI related medications in outpatient setting, ultimately seen at [facility] with placement of PEG/J tube to address enteral access and GI distress symptoms, now admitted 08/06/2024 to [facility] with hypertensive emergency.  While admitted, GI has been consulted for emesis with J-tube.  Alkaline phosphatase is elevated, GGT was elevated last admission.  Underwent MRCP last admission for assessment of this with no biliary dilation.  Already s/p cholecystectomy 2024.  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 pylori negative.  UDS intermittently positive for THC, although most recently negative in May 2025.  CT A/P confirms gastrojejunostomy tube in appropriate place.    08/09/24: UDS negative for THC this admission. Antimitochondrial antibody negative. Called back to see patient due to intractable nausea and vomiting. She is vomiting multiple times each hour despite being NPO ice chips and sips of clear liquids. CT A/P 08/06/24 with gastrojejunostomy in place. Fecal stasis noted on that CT. she tells me she has a bowel movement every day.  She tells me she is only nauseous when she drinks water  but does not become nauseous with PEG/J-tube feeds.     Impression:  Intractable nausea and vomiting  Gastroparesis s/p PEG/J     Plan:  Recommend strict n.p.o. and use of  PEG/J-tube for all nutrition.  PEG/J-tube was initially placed by [facility] for this reason.  Can consider bowel regimen such as MiraLAX  17 g once daily via feeding tube.    Melinda JINNY Peach, PA-C    Subjective:   Chief Complaint: Vomiting     History of Present Illness: GI consultation requested by Dr. Leonetta for emesis with J-tube.  43 year old female well-known to the GI service with a prior history of intractable nausea and vomiting found to have gastroparesis, inability to reliably obtain prokinetic and other GI related medications in outpatient setting ultimately seen at [facility] with placement of PEG/J tube to address enteral access and GI distress symptoms.  She was admitted 08/06/2024 with hypertensive emergency and while admitted, GI was consulted for emesis.  She is status postcholecystectomy, gallbladder explant pathology April 2024 revealed calculous cholecystitis.  She was assessed for similar symptoms by GI last month and MRCP was recommended due to  elevated alkaline phosphatase levels.  MRCP revealed cardiomegaly and third spacing but no pancreatic or biliary duct dilation.  MRCP also showed normal unenhanced liver, pancreas.  This admission, LFTs remain elevated with AST 42, ALT 48, alk phos 272, total bilirubin 0.5.  GGT was elevated last admission at 634.  Urinalysis positive for [facility] January 2025, February 2025, but then negative for [facility] May 2025.       Last EGD 09/21/2023 by Dr. Madlyn:  Impression:   Unremarkable EGD/upper endoscopy with minimal gastric erythema as previously noted  Mucosal biopsies obtained from the stomach, [place] biopsy protocol for further assessment of previously noted intestinal metaplasia  Mucosal biopsies obtained from the duodenum     FINAL PATHOLOGIC DIAGNOSIS     1.  Duodenum, biopsy:        Benign duodenal mucosa, no histopathologic changes   No evidence of villous atrophy or increased intraepithelial lymphocytes          2.  Gastric antrum, biopsy:         Chronic inactive antral gastritis with atrophy   No evidence of intestinal metaplasia or dysplasia     3.  Gastric angularis, biopsy:        Chronic inactive gastritis with atrophy   No evidence of intestinal metaplasia or dysplasia     4.  Gastric body, biopsies:        Mild chronic inactive gastritis of the oxyntic type mucosa   No evidence of intestinal metaplasia or dysplasia      Addendum Comment   Immunohistochemical stains performed on block 2A, 3A, 4A for Helicobacter   pylori are negative.  Positive and negative controls section stain   appropriately.          ROS:  The previous review of systems on initial consultation / H&P is noted and reviewed.  Specific changes noted above in HPI.    Current Medications:     Current Facility-Administered Medications   Medication Dose Route Frequency    cefTRIAXone  (ROCEPHIN ) 2,000 mg in sterile water  20 mL IV syringe  2,000 mg IntraVENous Q24H    albumin  human 25% IV solution 25 g  25 g IntraVENous PRN    epoetin  alfa-epbx (RETACRIT ) injection 10,000 Units  10,000 Units SubCUTAneous Q MWF    vitamin B-6 (PYRIDOXINE ) tablet 25 mg  25 mg Oral Q6H    prochlorperazine  (COMPAZINE ) injection 10 mg  10 mg IntraVENous Q6H PRN    0.9 % sodium chloride  infusion   IntraVENous Continuous    famotidine  (PEPCID ) tablet 20 mg  20 mg Oral Daily    diphenhydrAMINE  (BENADRYL ) injection 12.5 mg  12.5 mg IntraVENous Q8H PRN    busPIRone  (BUSPAR ) tablet 5 mg  5 mg Oral BID    diazePAM  (VALIUM ) injection 5 mg  5 mg IntraVENous Q4H PRN    sodium chloride  flush 0.9 % injection 5-40 mL  5-40 mL IntraVENous 2 times per day    sodium chloride  flush 0.9 % injection 5-40 mL  5-40 mL IntraVENous PRN    0.9 % sodium chloride  infusion   IntraVENous PRN    heparin  (porcine) injection 5,000 Units  5,000 Units SubCUTAneous 3 times per day    polyethylene glycol (GLYCOLAX ) packet 17 g  17 g Oral Daily PRN    acetaminophen  (TYLENOL ) tablet 650 mg  650 mg Oral Q6H PRN    Or    acetaminophen  (TYLENOL )  suppository 650 mg  650 mg Rectal Q6H PRN    amLODIPine  (  NORVASC ) tablet 10 mg  10 mg Oral Daily    aspirin  chewable tablet 81 mg  81 mg Oral Daily    atorvastatin  (LIPITOR ) tablet 80 mg  80 mg Oral QHS    carvedilol  (COREG ) tablet 12.5 mg  12.5 mg Oral BID WC    clopidogrel  (PLAVIX ) tablet 75 mg  75 mg Oral Daily    DULoxetine  (CYMBALTA ) extended release capsule 60 mg  60 mg Oral Daily    hydrALAZINE  (APRESOLINE ) tablet 50 mg  50 mg Oral TID    sennosides-docusate sodium  (SENOKOT-S) 8.6-50 MG tablet 1 tablet  1 tablet Oral BID    sevelamer  (RENVELA ) packet 0.8 g  0.8 g Oral TID WC    sodium bicarbonate  tablet 650 mg  650 mg Oral TID    oxyCODONE  (ROXICODONE ) immediate release tablet 5 mg  5 mg Oral Q4H PRN    Or    oxyCODONE  (ROXICODONE ) immediate release tablet 10 mg  10 mg Oral Q4H PRN    morphine  sulfate (PF) injection 2 mg  2 mg IntraVENous Q2H PRN    Or    morphine  sulfate (PF) injection 4 mg  4 mg IntraVENous Q2H PRN    insulin  lispro (HUMALOG ,ADMELOG ) injection vial 0-8 Units  0-8 Units SubCUTAneous 4x Daily AC & HS    pantoprazole  (PROTONIX ) 40 mg in sodium chloride  (PF) 0.9 % 10 mL injection  40 mg IntraVENous Daily       Objective:     VITALS:   Last 24hrs VS reviewed since prior progress note. Most recent are:  Vitals:    08/09/24 1312   BP:    Pulse:    Resp: 18   Temp:    SpO2:      Temp (24hrs), Avg:98.4 F (36.9 C), Min:98.2 F (36.8 C), Max:98.8 F (37.1 C)    No intake or output data in the 24 hours ending 08/09/24 1413    General: alert and no distress  Head: Normocephalic, without obvious abnormality, atraumatic  Eyes: anicteric sclerae and conjuntiva clear  Lungs: normal respiratory effort  Abd: PEG in appropriate position without bleeding or drainage, not distended, soft, +diffuse generalized tenderness to palpation  Ext: no cyanosis and no edema  Skin: normal skin color, no rashes, and texture normal  Neuro:  Alert and oriented  Psych: not anxious, cooperative, appropriate affect     Lab  Data Reviewed:   Recent Labs     08/07/24  1205 08/08/24  0156 08/09/24  0159   WBC 8.4 9.1 15.9*   HGB 11.5 9.7* 10.1*   HCT 34.7* 31.1* 31.8*   PLT 307 314 275     Recent Labs     08/07/24  1119 08/08/24  0156 08/09/24  0159   NA 139 137 135*   K 4.1 4.0 3.7   CL 97* 98 96*   CO2 30* 29 28   BUN 14 19 11    ALT 32 25 33     No results found for: GLUCPOC  No results for input(s): PH, PCO2, PO2, HCO3, FIO2 in the last 72 hours.  No results for input(s): INR in the last 72 hours.        Assessment:   (See above)  Principal Problem:    Hypertensive emergency  Resolved Problems:    * No resolved hospital problems. *      Plan:   (See above)      Signed By: Melinda JINNY Peach, PA-C     08/09/2024  2:13 PM         "

## 2024-08-09 NOTE — Progress Notes (Signed)
"  1900 Bedside shift change report given to Reche PEAK (oncoming nurse) by April RN (offgoing nurse). Report included the following information Nurse Handoff Report, Adult Overview, Intake/Output, MAR, Cardiac Rhythm NS, and Neuro Assessment.     2225 NP notified critical blood culture results    0058 NP notified critical blood culture results  "

## 2024-08-09 NOTE — Plan of Care (Signed)
"    Problem: Chronic Conditions and Co-morbidities  Goal: Patient's chronic conditions and co-morbidity symptoms are monitored and maintained or improved  Outcome: Progressing     Problem: Discharge Planning  Goal: Discharge to home or other facility with appropriate resources  Outcome: Progressing     Problem: Pain  Goal: Verbalizes/displays adequate comfort level or baseline comfort level  08/09/2024 1145 by Augustina Earing, RN  Outcome: Progressing  08/09/2024 0903 by Keller Mora, RN  Outcome: Progressing     Problem: Safety - Adult  Goal: Free from fall injury  08/09/2024 1145 by Augustina Earing, RN  Outcome: Progressing  08/09/2024 0903 by Keller Mora, RN  Outcome: Progressing     Problem: Nutrition Deficit:  Goal: Optimize nutritional status  Outcome: Progressing     Problem: Gastrointestinal - Adult  Goal: Minimal or absence of nausea and vomiting  Outcome: Progressing  Goal: Maintains or returns to baseline bowel function  Outcome: Progressing  Goal: Maintains adequate nutritional intake  Outcome: Progressing     Problem: Skin/Tissue Integrity  Goal: Skin integrity remains intact  Description: 1.  Monitor for areas of redness and/or skin breakdown  2.  Assess vascular access sites hourly  3.  Every 4-6 hours minimum:  Change oxygen saturation probe site  4.  Every 4-6 hours:  If on nasal continuous positive airway pressure, respiratory therapy assess nares and determine need for appliance change or resting period  08/09/2024 1145 by Augustina Earing, RN  Outcome: Progressing  08/09/2024 0903 by Keller Mora, RN  Outcome: Progressing     "

## 2024-08-09 NOTE — Care Coordination (Signed)
"  Care Management Progress Note    Reason for Admission:   Hypertensive emergency [I16.1]  Intractable nausea and vomiting [R11.2]         Patient Admission Status: Inpatient  RUR:   Hospitalization in the last 30 days (Readmission):  No        Transition of care plan:  Medical - nephro following, watching labs, patient continues with emesis  DC plan - home with family when stable. Plan to continue with established OP dialysis with [facility]  Discharge plan communicated with patient and/or discharge caregiver: Yes    Date 1st IMM letter given:   Outpatient follow-up - per medical team  Transport at discharge:  family  "

## 2024-08-09 NOTE — Progress Notes (Signed)
 "Comprehensive Nutrition Assessment    Type and Reason for Visit: Reassess    Nutrition Recommendations/Plan:     Recommend starting Cyclic Nocturnal TF via J-port:          - Nepro at 40 ml/hr x 10 hours (8pm-6am)         - 1 ProSource daily (Flush with 15 ml h20 before and after)         - 30 ml h20 flush q4h around the clock.     2.   3 Carb Choice, No concentrated sweets, Low Fiber diet order when medically appropriate       Malnutrition Assessment:  Malnutrition Status:  Insufficient data (08/07/24 1316)    Context:  Chronic Illness     Findings of the 6 clinical characteristics of malnutrition:  Energy Intake:  Mild decrease in energy intake  Weight Loss:  Mild weight loss     Body Fat Loss:  Unable to assess     Muscle Mass Loss:  Unable to assess    Fluid Accumulation:  No fluid accumulation    Grip Strength:  Not Performed     Nutrition Assessment:    43 yo female admitted for HTN emergency, N/V. Pmhx: DM, Gastroparesis, HTN, ESRD - on HD, GJ tube.     10/30:  Follow up.  Pt remains NPO.  Will recommend starting TF via J port.  Still having N/V.  No new weight.     10/28: MD consult received for TF rec's. Chart review indicate G-J tube placed in August 2025 and started on cyclic TF.  Spoke with pt's daughter at bedside.  She reports pt eats by mouth at home.  Some days she has n/v and is not able to tolerate PO so she gives herself 3 bolus feeds of NovaSource Renal 2.0 3x/day.  Pt and daughter unsure how much she gives herself, 2 large syringes full of feed.  On days when she eats and tolerates PO then she does not use feeding tube or might do one feed at night.  When I asked what foods she eats by mouth, she told me soups, cooked vegetables, and fruits.  Discussed low fiber diet necessary for gastroparesis, recommended all vegetables be cooked very soft, no beans in her soups, and fruits be canned.  Daughter states she has been losing weight, unable to quantify.  Weight hx in EMR indicates 6% loss over last  5 months which is not significant for time frame.  Weight seems to fluctuate between 115-135 lbs over last 1.5 years.       Pt currently NPO, MD wants to hold off on starting TF.  Pt was actively vomiting during my visit.  Did not complete NFPE because of this.     TF rec's when ready to start PO/TF:  Nepro at 40 ml/hr x 10 hours + 1 ProSource daily + 30 ml h20 flush q4h around the clock.  This will provide 400 ml, 800 kcals (59% kcal needs), 52 gm protein (74% protein needs), 64 gm CHO, 290 + 30 + 180 = 500 ml water        Nutritionally Significant Medications:  NaCl at 75 ml/hr, Vit B6, Epo, Senokot, Compazine     Estimated Daily Nutrient Needs:  Energy Requirements Based On: Kcal/kg  Weight Used for Energy Requirements: Current  Energy (kcal/day): 1365-1640 (25-30 kcals/kg)  Weight Used for Protein Requirements: Current  Protein (g/day): 70 (1.3 gm/kg)  Method Used for Fluid Requirements: 1 ml/kcal  Fluid (  ml/day): 1365    Nutrition Related Findings:   Edema: None                  Recent Labs     08/06/24  1554 08/07/24  1119 08/08/24  0156 08/09/24  0159   GLUCOSE 190* 137* 122* 140*   BUN 41* 14 19 11    CREATININE 5.77* 3.34* 4.43* 3.47*   NA 139 139 137 135*   K 3.8 4.1 4.0 3.7   CL 94* 97* 98 96*   CO2 29 30* 29 28   CALCIUM  9.2 8.8 8.0* 8.2*     Recent Labs     08/07/24  0805 08/07/24  1125 08/07/24  1621 08/07/24  2041 08/08/24  0716 08/08/24  1141 08/08/24  1530 08/08/24  1652 08/08/24  2122 08/09/24  0751 08/09/24  1204   POCGLU 197* 147* 200* 181* 114 94 187* 243* 124* 166* 150*     Lab Results   Component Value Date/Time    LABA1C 6.1 06/27/2024 05:10 AM    LABA1C 7.0 02/19/2024 05:02 AM    LABA1C 6.8 10/24/2023 03:52 AM    EAG 127 06/27/2024 05:10 AM     Triglycerides   Date Value Ref Range Status   08/07/2024 171 (H) 0 - 150 MG/DL Final     Comment:     Borderline High: 150-199 mg/dL, High: 799-500 mg/dL  Very High: Greater than or equal to 500 mg/dL     98/98/7974 844 (H) <150 MG/DL Final     Comment:      Based on NCEP-ATP III:  Triglycerides <150 mg/dL  is considered normal, 150-199 mg/dL  borderline high,  799-500 mg/dL high and  greater than or equal to 500 mg/dL very high.   09/21/2023 453 (H) <150 MG/DL Final     Comment:     Based on NCEP-ATP III:  Triglycerides <150 mg/dL  is considered normal, 150-199 mg/dL  borderline high,  799-500 mg/dL high and  greater than or equal to 500 mg/dL very high.       Last BM: 08/08/24    Wounds: Wound Type: None    Current Nutrition Therapies:  Diet: NPO  Supplements: none  Meal Intake:   No data found.  Supplement Intake:  No data found.  Nutrition Support: none    Anthropometric Measures:  Height: 149.9 cm (4' 11.02)  Ideal Body Weight (IBW): 95 lbs (43 kg)       Current Body Weight: 54.6 kg (120 lb 5.9 oz), 126.7 % IBW. Weight Source: Bed scale  Current BMI (kg/m2): 24.3        Weight Adjustment For: No Adjustment                 BMI Categories: Normal Weight (BMI 18.5-24.9)    Wt Readings from Last 10 Encounters:   08/06/24 54.6 kg (120 lb 5.9 oz)   07/20/24 57.2 kg (126 lb 1.7 oz)   07/11/24 54.6 kg (120 lb 5.9 oz)   07/09/24 55.5 kg (122 lb 5.7 oz)   03/06/24 57.6 kg (127 lb)   02/21/24 58 kg (127 lb 13.9 oz)   11/28/23 52.2 kg (115 lb 1.3 oz)   10/31/23 52.6 kg (115 lb 15.4 oz)   10/21/23 55.2 kg (121 lb 11.1 oz)   10/12/23 60.4 kg (133 lb 3.2 oz)       Nutrition Diagnosis:   Inadequate protein-energy intake related to nausea/vomiting/diarrhea as evidenced by poor intake prior to  admission    Nutrition Interventions:   Food and/or Nutrient Delivery: Start Oral Diet, Start Tube Feeding  Nutrition Education/Counseling: Education/Counseling initiated  Coordination of Nutrition Care: Continue to monitor while inpatient       Goals:  Previous Goal Met: No Progress toward Goal(s)  Goals: Meet at least 75% of estimated needs, by next RD assessment, Initiate PO diet, Initiate nutrition support       Nutrition Monitoring and Evaluation:   Behavioral-Environmental Outcomes:  None Identified  Food/Nutrient Intake Outcomes: Diet Advancement/Tolerance, Food and Nutrient Intake, Supplement Intake, Enteral Nutrition Intake/Tolerance  Physical Signs/Symptoms Outcomes: Biochemical Data, Nausea or Vomiting, Weight    Discharge Planning:    Enteral Nutrition     Adella Acron, RD  Available via [employer]    "

## 2024-08-10 ENCOUNTER — Inpatient Hospital Stay: Payer: PRIVATE HEALTH INSURANCE

## 2024-08-10 LAB — CULTURE, URINE: Colony count: >100000

## 2024-08-10 LAB — CBC WITH AUTO DIFFERENTIAL
Basophils %: 0.3 % (ref 0.0–1.0)
Basophils Absolute: 0.04 K/UL (ref 0.00–0.10)
Eosinophils %: 1.3 % (ref 0.0–7.0)
Eosinophils Absolute: 0.18 K/UL (ref 0.00–0.40)
Hematocrit: 32.1 % — ABNORMAL LOW (ref 35.0–47.0)
Hemoglobin: 9.6 g/dL — ABNORMAL LOW (ref 11.5–16.0)
Immature Granulocytes %: 0.4 % (ref 0.0–0.5)
Immature Granulocytes Absolute: 0.06 K/UL — ABNORMAL HIGH (ref 0.00–0.04)
Lymphocytes %: 5.4 % — ABNORMAL LOW (ref 12.0–49.0)
Lymphocytes Absolute: 0.77 K/UL — ABNORMAL LOW (ref 0.80–3.50)
MCH: 29.8 pg (ref 26.0–34.0)
MCHC: 29.9 g/dL — ABNORMAL LOW (ref 30.0–36.5)
MCV: 99.7 FL — ABNORMAL HIGH (ref 80.0–99.0)
MPV: 9.1 FL (ref 8.9–12.9)
Monocytes %: 5.2 % (ref 5.0–13.0)
Monocytes Absolute: 0.74 K/UL (ref 0.00–1.00)
Neutrophils %: 87.4 % — ABNORMAL HIGH (ref 32.0–75.0)
Neutrophils Absolute: 12.41 K/UL — ABNORMAL HIGH (ref 1.80–8.00)
Nucleated RBCs: 0 /100{WBCs}
Platelets: 262 K/uL (ref 150–400)
RBC: 3.22 M/uL — ABNORMAL LOW (ref 3.80–5.20)
RDW: 14.3 % (ref 11.5–14.5)
WBC: 14.2 K/uL — ABNORMAL HIGH (ref 3.6–11.0)
nRBC: 0 K/uL (ref 0.00–0.01)

## 2024-08-10 LAB — CULTURE, BLOOD, PCR ID PANEL RESULTS REPORT
Acinetobacter calcoac baumannii complex by PCR: NOT DETECTED
Bacteroides fragilis by PCR: NOT DETECTED
Candida albicans by PCR: NOT DETECTED
Candida auris by PCR: NOT DETECTED
Candida glabrata: NOT DETECTED
Candida krusei by PCR: NOT DETECTED
Candida parapsilosis by PCR: NOT DETECTED
Candida tropicalis by PCR: NOT DETECTED
Cryptococcus neoformans/gattii by PCR: NOT DETECTED
Enterobacter cloacae complex by PCR: NOT DETECTED
Enterobacteriaceae by PCR: NOT DETECTED
Enterococcus faecalis by PCR: NOT DETECTED
Enterococcus faecium by PCR: NOT DETECTED
Escherichia Coli: NOT DETECTED
Haemophilus Influenzae by PCR: NOT DETECTED
Klebsiella aerogenes by PCR: NOT DETECTED
Klebsiella oxytoca by PCR: NOT DETECTED
Klebsiella pneumoniae group by PCR: NOT DETECTED
Listeria monocytogenes by PCR: NOT DETECTED
Neisseria meningitidis by PCR: NOT DETECTED
Proteus by PCR: NOT DETECTED
Pseudomonas aeruginosa: NOT DETECTED
Resistant gene meca/c & mrej by PCR: NOT DETECTED
STAPHYLOCOCCUS: DETECTED — AB
STREPTOCOCCUS: NOT DETECTED
Salmonella species by PCR: NOT DETECTED
Serratia marcescens by PCR: NOT DETECTED
Staphylococcus Aureus: DETECTED — AB
Staphylococcus epidermidis by PCR: NOT DETECTED
Staphylococcus lugdunensis by PCR: NOT DETECTED
Stenotrophomonas maltophilia by PCR: NOT DETECTED
Strep pneumoniae: NOT DETECTED
Strep pyogenes,(Grp. A): NOT DETECTED
Streptococcus agalactiae (Group B): NOT DETECTED

## 2024-08-10 LAB — POCT GLUCOSE
POC Glucose: 145 mg/dL — ABNORMAL HIGH (ref 65–117)
POC Glucose: 126 mg/dL — ABNORMAL HIGH (ref 65–117)
POC Glucose: 128 mg/dL — ABNORMAL HIGH (ref 65–117)
POC Glucose: 210 mg/dL — ABNORMAL HIGH (ref 65–117)

## 2024-08-10 LAB — COMPREHENSIVE METABOLIC PANEL
ALT: 22 U/L (ref 10–35)
AST: 19 U/L (ref 10–35)
Albumin/Globulin Ratio: 0.8 — ABNORMAL LOW (ref 1.1–2.2)
Albumin: 2.8 g/dL — ABNORMAL LOW (ref 3.5–5.2)
Alk Phosphatase: 172 U/L — ABNORMAL HIGH (ref 35–104)
Anion Gap: 17 mmol/L — ABNORMAL HIGH (ref 2–14)
BUN/Creatinine Ratio: 4 — ABNORMAL LOW (ref 12–20)
BUN: 21 mg/dL — ABNORMAL HIGH (ref 6–20)
CO2: 22 mmol/L (ref 20–29)
Calcium: 8 mg/dL — ABNORMAL LOW (ref 8.6–10.0)
Chloride: 100 mmol/L (ref 98–107)
Creatinine: 4.76 mg/dL — ABNORMAL HIGH (ref 0.60–1.00)
Est, Glom Filt Rate: 11 ml/min/1.73m2 — ABNORMAL LOW (ref >59)
Globulin: 3.4 g/dL (ref 2.0–4.0)
Glucose: 127 mg/dL — ABNORMAL HIGH (ref 65–100)
Potassium: 3.9 mmol/L (ref 3.5–5.1)
Sodium: 138 mmol/L (ref 136–145)
Total Bilirubin: 0.3 mg/dL (ref 0.0–1.2)
Total Protein: 6.2 g/dL — ABNORMAL LOW (ref 6.4–8.3)

## 2024-08-10 MED ORDER — VANCOMYCIN INTERMITTENT DOSING (PLACEHOLDER)
Freq: Once | INTRAVENOUS | Status: DC
Start: 2024-08-10 — End: 2024-08-12

## 2024-08-10 MED ORDER — VANCOMYCIN HCL 500 MG IV SOLR
500 | Freq: Once | INTRAVENOUS | Status: AC
Start: 2024-08-10 — End: 2024-08-10
  Administered 2024-08-10: 21:00:00 500 mg via INTRAVENOUS

## 2024-08-10 MED ORDER — CEFTRIAXONE SODIUM 2 G IJ SOLR
2 | INTRAMUSCULAR | Status: DC
Start: 2024-08-10 — End: 2024-08-12
  Administered 2024-08-10 – 2024-08-11 (×2): 2000 mg via INTRAVENOUS

## 2024-08-10 MED ORDER — VANCOMYCIN INTERMITTENT DOSING (PLACEHOLDER)
INTRAVENOUS | Status: DC
Start: 2024-08-10 — End: 2024-08-12

## 2024-08-10 MED ORDER — CEFAZOLIN SODIUM 1 G IJ SOLR
1 | INTRAMUSCULAR | Status: DC
Start: 2024-08-10 — End: 2024-08-10
  Administered 2024-08-10: 09:00:00 1000 mg via INTRAVENOUS

## 2024-08-10 MED ORDER — SODIUM CHLORIDE 0.9 % IV SOLN
0.9 | Freq: Once | INTRAVENOUS | Status: AC
Start: 2024-08-10 — End: 2024-08-10
  Administered 2024-08-10: 13:00:00 1250 mg/kg via INTRAVENOUS

## 2024-08-10 MED ORDER — CEFTRIAXONE SODIUM 1 G IJ SOLR
1 | INTRAMUSCULAR | Status: DC
Start: 2024-08-10 — End: 2024-08-10

## 2024-08-10 MED FILL — HEPARIN SODIUM (PORCINE) 5000 UNIT/ML IJ SOLN: 5000 [IU]/mL | INTRAMUSCULAR | Qty: 1 | Fill #0

## 2024-08-10 MED FILL — FAMOTIDINE 20 MG PO TABS: 20 mg | ORAL | Qty: 1 | Fill #0

## 2024-08-10 MED FILL — AMLODIPINE BESYLATE 5 MG PO TABS: 5 mg | ORAL | Qty: 2 | Fill #0

## 2024-08-10 MED FILL — CEFAZOLIN SODIUM 1 G IJ SOLR: 1 g | INTRAMUSCULAR | Qty: 1000 | Fill #0

## 2024-08-10 MED FILL — SODIUM BICARBONATE 650 MG PO TABS: 650 mg | ORAL | Qty: 1 | Fill #0

## 2024-08-10 MED FILL — VITAMIN B-6 50 MG PO TABS: 50 mg | ORAL | Qty: 1 | Fill #0

## 2024-08-10 MED FILL — PANTOPRAZOLE SODIUM 40 MG IV SOLR: 40 mg | INTRAVENOUS | Qty: 40 | Fill #0

## 2024-08-10 MED FILL — MORPHINE SULFATE 4 MG/ML IJ SOLN: 4 mg/mL | INTRAMUSCULAR | Qty: 1 | Fill #0

## 2024-08-10 MED FILL — SEVELAMER CARBONATE 0.8 G PO PACK: 0.8 g | ORAL | Qty: 1 | Fill #0

## 2024-08-10 MED FILL — CARVEDILOL 12.5 MG PO TABS: 12.5 mg | ORAL | Qty: 1 | Fill #0

## 2024-08-10 MED FILL — PROCHLORPERAZINE EDISYLATE 10 MG/2ML IJ SOLN: 10 MG/2ML | INTRAMUSCULAR | Qty: 2 | Fill #0

## 2024-08-10 MED FILL — CLOPIDOGREL BISULFATE 75 MG PO TABS: 75 mg | ORAL | Qty: 1 | Fill #0

## 2024-08-10 MED FILL — RETACRIT 10000 UNIT/ML IJ SOLN: 10000 [IU]/mL | INTRAMUSCULAR | Qty: 1 | Fill #0

## 2024-08-10 MED FILL — SENNOSIDES-DOCUSATE SODIUM 8.6-50 MG PO TABS: 8.6-50 mg | ORAL | Qty: 1 | Fill #0

## 2024-08-10 MED FILL — DULOXETINE HCL 30 MG PO CPEP: 30 mg | ORAL | Qty: 2 | Fill #0

## 2024-08-10 MED FILL — BUSPIRONE HCL 5 MG PO TABS: 5 mg | ORAL | Qty: 1 | Fill #0

## 2024-08-10 MED FILL — HYDRALAZINE HCL 25 MG PO TABS: 25 mg | ORAL | Qty: 2 | Fill #0

## 2024-08-10 MED FILL — VANCOMYCIN HCL 1.25 G IV SOLR: 1.25 g | INTRAVENOUS | Qty: 1250 | Fill #0

## 2024-08-10 MED FILL — INSULIN LISPRO 100 UNIT/ML IJ SOLN: 100 [IU]/mL | INTRAMUSCULAR | Qty: 2 | Fill #0

## 2024-08-10 MED FILL — ALBUMIN HUMAN 25 % IV SOLN: 25 % | INTRAVENOUS | Qty: 100 | Fill #0

## 2024-08-10 MED FILL — CEFTRIAXONE SODIUM 2 G IJ SOLR: 2 g | INTRAMUSCULAR | Qty: 2000 | Fill #0

## 2024-08-10 MED FILL — ASPIRIN 81 MG PO CHEW: 81 mg | ORAL | Qty: 1 | Fill #0

## 2024-08-10 MED FILL — VANCOMYCIN HCL 500 MG IV SOLR: 500 mg | INTRAVENOUS | Qty: 500 | Fill #0

## 2024-08-10 MED FILL — ATORVASTATIN CALCIUM 20 MG PO TABS: 20 mg | ORAL | Qty: 4 | Fill #0

## 2024-08-10 NOTE — Dialysis (Signed)
"  Hep B verification performed for (RN): oncoming rn  Hep B results, dates, and Primary Source: Negative, Susceptible  08/07/2024  epic  Hepatitis B Surface Ag   Date/Time Value Ref Range Status   08/07/2024 01:38 AM NONREACTIVE NR   Final     Hep B S Ag Interp   Date/Time Value Ref Range Status   03/16/2023 08:19 AM Negative NEG   Final     Hep B S Ab   Date/Time Value Ref Range Status   08/07/2024 01:38 AM <3.50 mIU/mL Final     Comment:        <10.00 mIU/mL     Non-Immune  =>10.00 mIU/mL    Individual is considered to be immune to infection with HBV       Machine disinfect process:acid/heat  "

## 2024-08-10 NOTE — Progress Notes (Signed)
"  [facility] Dosing Services: Antimicrobial Stewardship Daily Doc  Consult for antibiotic dosing of vancomycin  by Dr. Leonetta  Indication: staph aureus bacteremia  Day of Therapy: 1  ID consulted  *Dosing note: HD M/W/F    Ht Readings from Last 1 Encounters:   08/07/24 1.499 m (4' 11.02)        Wt Readings from Last 1 Encounters:   08/06/24 54.6 kg (120 lb 5.9 oz)      Vancomycin  therapy:  Loading dose: Vancomycin  1,250 mg IV x 1 dose now  Maintenance dose: dosing based on levels per protocol for patient on intermittent HD  Dose calculated to approximate a pre-HD level of 15-20 mcg/mL   Last level:   Assessment/Plan: Temp (24hrs), Avg:98.4 F (36.9 C), Min:98.1 F (36.7 C), Max:99.3 F (37.4 C)  WBCs elevated; active inpatient order for HD M/W/F. After loading dose, dosing will be based on levels per protocol for patient on intermittent HD. No levels ordered yet.  Dose administration notes:     Other Antimicrobial   (not dosed by pharmacist) Ceftriaxone  2,000 mg IV every 24 hours   Cultures 08/10/2024 Blood: no growth x 4 hours (pending)  08/10/2024 Blood: no growth x 5 hours (pending)  08/09/2024 Urine: in process  08/09/2024 Blood: probable staph aureus 1 of 2 (pending)  08/09/2024 Blood: GPC 1 of 2 (pending)  08/09/2024 Blood PCR: staph aureus   Serum Creatinine Lab Results   Component Value Date/Time    CREATININE 4.76 08/10/2024 02:06 AM      Creatinine Clearance Estimated Creatinine Clearance: 12 mL/min (A) (based on SCr of 4.76 mg/dL (H)).     Temp Temp: 98.2 F (36.8 C) (Oral)       WBC Lab Results   Component Value Date/Time    WBC 14.2 08/10/2024 02:06 AM      Procalcitonin Lab Results   Component Value Date/Time    PROCAL 0.16 08/09/2024 06:02 AM      For Antifungals, Metronidazole  and Nafcillin: Lab Results   Component Value Date/Time    ALT 22 08/10/2024 02:06 AM    AST 19 08/10/2024 02:06 AM        Pharmacist: RANDALL JONELLE ALAMIN, RPH  "

## 2024-08-10 NOTE — Plan of Care (Signed)
"    Problem: Chronic Conditions and Co-morbidities  Goal: Patient's chronic conditions and co-morbidity symptoms are monitored and maintained or improved  Outcome: Progressing     Problem: Discharge Planning  Goal: Discharge to home or other facility with appropriate resources  Outcome: Progressing     Problem: Pain  Goal: Verbalizes/displays adequate comfort level or baseline comfort level  Outcome: Progressing     Problem: Safety - Adult  Goal: Free from fall injury  Outcome: Progressing     Problem: Skin/Tissue Integrity  Goal: Skin integrity remains intact  Description: 1.  Monitor for areas of redness and/or skin breakdown  2.  Assess vascular access sites hourly  3.  Every 4-6 hours minimum:  Change oxygen saturation probe site  4.  Every 4-6 hours:  If on nasal continuous positive airway pressure, respiratory therapy assess nares and determine need for appliance change or resting period  Outcome: Progressing     "

## 2024-08-10 NOTE — Progress Notes (Signed)
 "[facility]  [address] [place], [place] [address]  [phone]        Hospitalist Progress Note      NAME: Melinda [name]   DOB:  Aug 10, 1981  MRM:  [id number]    Date/Time of service: 08/10/2024  4:48 PM       Subjective:   Patient was personally seen and examined by me during this time period.  Chart reviewed.     Patient reports that vomiting is improving.  She does report mid back pain.  ROS: per history above       Objective:       Vitals:       Last 24hrs VS reviewed since prior progress note. Most recent are:    Vitals:    08/10/24 1522   BP:    Pulse:    Resp: 17   Temp:    SpO2:      SpO2 Readings from Last 6 Encounters:   08/10/24 93%   07/20/24 94%   07/11/24 98%   07/09/24 98%   07/05/24 95%   07/02/24 95%          Intake/Output Summary (Last 24 hours) at 08/10/2024 1648  Last data filed at 08/10/2024 1510  Gross per 24 hour   Intake 560 ml   Output 2500 ml   Net -1940 ml          Exam:     Physical Exam:    Gen:  Well-developed, well-nourished, appears uncomfortable.  HEENT:  Pink conjunctivae, hearing intact to voice  Neck:  Supple  Resp:  No accessory muscle use, clear breath sounds without wheezes rales or rhonchi  Card: 3/6 systolic murmur normal S1, S2 without thrills, bruits or peripheral edema  Abd:  Soft, mild diffuse tenderness similar to yesterday.  Nondistended  Skin/MSK: Tenderness palpation of the mid to upper spine.  No rashes or ulcers, skin turgor is good  Neuro:  Cranial nerves 3-12 are grossly intact, follows commands appropriately  Psych:  Good insight, oriented to person, place and time, alert      Medications Reviewed: (see below)    Lab Data Reviewed: (see below)    ______________________________________________________________________    Medications:     Current Facility-Administered Medications   Medication Dose Route Frequency    Vancomycin : Pharmacist dosing based on levels   Other RX Placeholder    cefTRIAXone  (ROCEPHIN ) 2,000 mg in  sterile water  20 mL IV syringe  2,000 mg IntraVENous Q24H    vancomycin  (VANCOCIN ) 500 mg in sodium chloride  0.9 % 100 mL IVPB (addEASE)  500 mg IntraVENous Once    [START ON 08/13/2024] Vancomycin  level 11/3 AM   Other Once    albumin  human 25% IV solution 25 g  25 g IntraVENous PRN    epoetin  alfa-epbx (RETACRIT ) injection 10,000 Units  10,000 Units SubCUTAneous Q MWF    vitamin B-6 (PYRIDOXINE ) tablet 25 mg  25 mg Oral Q6H    prochlorperazine  (COMPAZINE ) injection 10 mg  10 mg IntraVENous Q6H PRN    0.9 % sodium chloride  infusion   IntraVENous Continuous    famotidine  (PEPCID ) tablet 20 mg  20 mg Oral Daily    diphenhydrAMINE  (BENADRYL ) injection 12.5 mg  12.5 mg IntraVENous Q8H PRN    busPIRone  (BUSPAR ) tablet 5 mg  5 mg Oral BID    diazePAM  (VALIUM ) injection 5 mg  5 mg IntraVENous Q4H PRN    sodium chloride  flush 0.9 %  injection 5-40 mL  5-40 mL IntraVENous 2 times per day    sodium chloride  flush 0.9 % injection 5-40 mL  5-40 mL IntraVENous PRN    0.9 % sodium chloride  infusion   IntraVENous PRN    heparin  (porcine) injection 5,000 Units  5,000 Units SubCUTAneous 3 times per day    polyethylene glycol (GLYCOLAX ) packet 17 g  17 g Oral Daily PRN    acetaminophen  (TYLENOL ) tablet 650 mg  650 mg Oral Q6H PRN    Or    acetaminophen  (TYLENOL ) suppository 650 mg  650 mg Rectal Q6H PRN    amLODIPine  (NORVASC ) tablet 10 mg  10 mg Oral Daily    aspirin  chewable tablet 81 mg  81 mg Oral Daily    atorvastatin  (LIPITOR ) tablet 80 mg  80 mg Oral QHS    carvedilol  (COREG ) tablet 12.5 mg  12.5 mg Oral BID WC    clopidogrel  (PLAVIX ) tablet 75 mg  75 mg Oral Daily    DULoxetine  (CYMBALTA ) extended release capsule 60 mg  60 mg Oral Daily    hydrALAZINE  (APRESOLINE ) tablet 50 mg  50 mg Oral TID    sennosides-docusate sodium  (SENOKOT-S) 8.6-50 MG tablet 1 tablet  1 tablet Oral BID    sevelamer  (RENVELA ) packet 0.8 g  0.8 g Oral TID WC    sodium bicarbonate  tablet 650 mg  650 mg Oral TID    oxyCODONE  (ROXICODONE ) immediate release  tablet 5 mg  5 mg Oral Q4H PRN    Or    oxyCODONE  (ROXICODONE ) immediate release tablet 10 mg  10 mg Oral Q4H PRN    morphine  sulfate (PF) injection 2 mg  2 mg IntraVENous Q2H PRN    Or    morphine  sulfate (PF) injection 4 mg  4 mg IntraVENous Q2H PRN    insulin  lispro (HUMALOG ,ADMELOG ) injection vial 0-8 Units  0-8 Units SubCUTAneous 4x Daily AC & HS    pantoprazole  (PROTONIX ) 40 mg in sodium chloride  (PF) 0.9 % 10 mL injection  40 mg IntraVENous Daily          Lab Review:     Recent Labs     08/08/24  0156 08/09/24  0159 08/10/24  0206   WBC 9.1 15.9* 14.2*   HGB 9.7* 10.1* 9.6*   HCT 31.1* 31.8* 32.1*   PLT 314 275 262     Recent Labs     08/08/24  0156 08/09/24  0159 08/10/24  0206   NA 137 135* 138   K 4.0 3.7 3.9   CL 98 96* 100   CO2 29 28 22    BUN 19 11 21*   ALT 25 33 22     No results found for: GLUCPOC       Assessment / Plan:     Principal Problem:    Hypertensive emergency  Active Problems:    MSSA bacteremia    End-stage renal disease on hemodialysis (HCC)  Resolved Problems:    * No resolved hospital problems. *        43 y.o. female with past medical history of ESRD on HD MWF, DM2, HTN, gastroparesis requiring JG tube who presented to emergency room with severe abdominal pain that started yesterday     # Diffuse abdominal pain with nausea and vomiting: Acute, present admission.  Patient has been admitted repeatedly for this in the past.  Her symptoms previously have been attributed to gastroparesis.  Patient does have a GJ tube.  CT abdomen/pelvis without  acute process.  -GI consult  - N.p.o. for bowel rest  - Normal saline IV fluids as patient does make urine  -Patient has had no improvement with Reglan .  QTc is too prolonged to do erythromycin .  -- Stop Zofran , start Compazine   -- B6 25 mg every 6 hours  --Benadryl  does seem to be helping some  - Could also consider nortriptyline though this is a slight risk of raising Qtc  -Pregnancy test negative  - IV Protonix   --TSH is mildly elevated this is  unlikely to contribute to patient's symptoms.  Needs outpatient follow-up for this.    Sepsis/Staph aureus bacteremia: Acute.  Unclear source.  Does have murmur on exam.  Urinalysis is also possibly positive.  I discussed the use of contrast both MRI and CT with nephrology and they are okay with it.  They said they can dialyze it off.  - ID consult  - Vancomycin  and Rocephin   - Echocardiogram  - MRI entire spine  - CT of graft on left arm  - Follow-up blood culture results  - Follow-up urine culture    Prolonged Qtc: Repeat EKG today shows QTc of 502.  - Stop Zofran -start Compazine   - Will avoid erythromycin  for now  - Repeat EKG still shows elevated QTc    Fentanyl  positive: Seen on drug screen.  May be a false positive in the setting of Benadryl  use.  Patient adamantly denies use.    # Hypertension POA  Will continue patient's home Coreg /hydralazine /amlodipine   Continue vitals per protocol     # #ESRD  On HD   Nephrology consulted    #GERD: Chronic.  Continue Pepcid .  IV Protonix  while NPO.     # T2DM with gastroparesis. POA, chronic.  Last A1c of 6.1 on 9/17.  Sliding scale insulin .    # S/p GJ tube- Required GJ tube placement for adequate nutrition.  Nutrition consulted, will hold tube feeds for now    Chronic pain or anxiety/depression?:  Unclear why on duloxetine  but will continue for now.  Continue BuSpar .    DAPT: Unclear why patient is on dual antiplatelet therapy.  Will continue for now.  May need to be titrated off outpatient    Anemia: Trend.  Follow-up outpatient       **Prior records, notes, labs, radiology, and medications reviewed in [facility] if needed**    Total time spent with patient care: 35 min **I personally saw and examined the patient during this time period**                 Care Plan discussed with: Patient    Discussed:  Care Plan    Prophylaxis:  Hep SQ    Disposition:  Home w/Family           ___________________________________________________    Attending Physician: Prentice JONETTA Gave,  MD    "

## 2024-08-10 NOTE — Progress Notes (Signed)
"  Patient/caregivers speak Spanish as their preferred language for their healthcare communication. For safe communication, use the [employer] interpreter carts or call:    Dwayne Arla Mosses  Certified Spanish Medical Interpreter  General phone: [phone] ( [phone])  Email: [email or web address]    Please always document the use of interpreting services (Interpreter's ID number) in your clinical notes.    Our interpreters are available for team members working with limited  English proficient (LEP) patients remotely, via phone or video or in person (if needed for special cases).        "

## 2024-08-10 NOTE — Consults (Signed)
 Session ID: [id number]  Session Duration: 9 minutes  Language: Spanish  Interpreter ID: #[id number]  Interpreter Name: [place]

## 2024-08-10 NOTE — Flowsheet Note (Signed)
 "  Primary RN SBAR:   Incapacitated Nurse edu. provided: Gust Bread, RN    Patient Education provided: yes  Preferred Education method and Primary language: verbal/english  Dialysis consent: yes  Hospital General Consent Verified: yes  Hospital associated wait time; reason: N/A  Hepatitis B Surface Ag   Date/Time Value Ref Range Status   08/07/2024 01:38 AM NONREACTIVE NR   Final     Hep B S Ag Interp   Date/Time Value Ref Range Status   03/16/2023 08:19 AM Negative NEG   Final     Hep B S Ab   Date/Time Value Ref Range Status   08/07/2024 01:38 AM <3.50 mIU/mL Final     Comment:        <10.00 mIU/mL     Non-Immune  =>10.00 mIU/mL    Individual is considered to be immune to infection with HBV       Hep B results, dates, and Primary Source: neg/susc 10/285/2025, EPIC  Hep B dual verification performed by (RN): Dinep M, RN  Machine disinfect process:HEAT       08/10/24 1130   Observations & Evaluations   Level of Consciousness 0   Oriented X 4   Respiratory Quality/Effort Unlabored   O2 Device None (Room air)   Skin Condition/Temp Dry;Warm   Edema None   Vital Signs   BP 122/65   Temp 98.7 F (37.1 C)   Pulse 86   Respirations 16   SpO2 91 %   Pain Assessment   Pain Assessment 0-10   Pain Level 0   Technical Checks   Dialysis Machine No. 02   RO Machine Number [id number]   Dialyzer Lot No. [id number]   Tubing Lot Number [id number]   All Connections Secure Yes   NS Bag Yes   Saline Line Double Clamped Yes   Dialyzer F-180   Prime Volume (mL) 200 mL   ICEBOAT I;C;E;B;O;A;T   RO Machine Log Sheet Completed Yes   Machine Alarm Self Test Completed;Passed   Child Psychotherapist Function;pH Reading   Radio Producer for Integrity Yes   Machine Conductivity 13.7   Machine Ph 7.4   Bleach Test (Neg) Yes   Bath Temperature 98.6 F (37 C)   Treatment Initiation   Dialyze Hours 3   Treatment  Initiation Universal Precautions maintained;Lines secured to patient;Connections secured;Prime given;Venous Parameters  set;Arterial Parameters set;Air foam detector engaged;Dialysate;Saline line double clamped;Dialyzer   Hemodialysis Fistula/Graft Arteriovenous fistula Left Arm   No placement date or time found.   Present on Admission/Arrival: Yes  Access Type: Arteriovenous fistula  Orientation: Left  Access Location: Arm   Site Assessment Clean, dry & intact   Thrill Present   Bruit Present   Status Accessed   Venous Needle Size 15 G   Arterial Needle Size 15 G   Accessed By: CHRISTELLA Nova, RN   Access Attempts  1   Date of Last Dressing Change 08/10/24   Access Interventions Chlorhexidine;Aseptic Technique;Needles taped to patient   Dialysis Bath   K+ (Potassium) 4   Ca+ (Calcium ) 2.5   Na+ (Sodium) 138   HCO3 (Bicarb) 38            08/10/24 1135   Treatment   Time On 1135   Treatment Goal 2-3 L   During Hemodialysis Assessment   Blood Flow Rate (ml/min) 400 ml/min   Arterial Pressure (mmHg) -150 mmHg   Venous Pressure (mmHg) 170   TMP 10  DFR 600   Comments HD tx started   Access Visible Yes   Ultrafiltration Rate (ml/hr) 710 ml/hr   Ultrafiltration Removed (ml) 0 ml          08/10/24 1510   Treatment   Time Off 1435   Observations & Evaluations   Level of Consciousness 0   Oriented X 4   Respiratory Quality/Effort Unlabored   O2 Device None (Room air)   Skin Condition/Temp Dry;Warm   Edema None   Vital Signs   BP (!) 163/90   Temp 98.8 F (37.1 C)   Pulse 86   Respirations 18   SpO2 93 %   Pain Assessment   Pain Assessment 0-10   Pain Level 0   Hemodialysis Fistula/Graft Arteriovenous fistula Left Arm   No placement date or time found.   Present on Admission/Arrival: Yes  Access Type: Arteriovenous fistula  Orientation: Left  Access Location: Arm   Site Assessment Clean, dry & intact   Thrill Present   Bruit Present   Status Deaccessed   Post-Hemodialysis Assessment   Post-Treatment Procedures Blood returned;Access bleeding time < 10 minutes   Machine Disinfection Process Acid/Vinegar Clean;Heat Disinfect;Exterior Scientist, Research (life Sciences) Volume (ml) 300 ml   Blood Volume Processed (Liters) 68 L   Dialyzer Clearance Moderately streaked   Duration of Treatment (minutes) 180 minutes   Hemodialysis Intake (ml) 500 ml   Hemodialysis Output (ml) 2500 ml   NET Removed (ml) 2000   Tolerated Treatment Good     "

## 2024-08-10 NOTE — Care Coordination (Signed)
"  Care Management Progress Note    Reason for Admission:   Hypertensive emergency [I16.1]  Intractable nausea and vomiting [R11.2]         Patient Admission Status: Inpatient  RUR:   Hospitalization in the last 30 days (Readmission):  No        Transition of care plan:  Medical ID to see for bacteremia, recs pending   DC plan - home with family when stable  Discharge plan communicated with patient and/or discharge caregiver: Yes    Date 1st IMM letter given:   Outpatient follow-up - per medical team, continue with OP dialysis with [facility]   Transport at discharge:  family  "

## 2024-08-10 NOTE — Consults (Signed)
 "Infectious Disease Consult    Impression/Plan   Staphylococcus auris bacteremia.  Likely MSSA based on preliminary testing.  Suspect related to dialysis however left AV fistula site is unremarkable.  Serial blood cultures to document sterilization of the blood.  Narrow antibiotics to vancomycin  and cefazolin .  Patient will need echocardiogram.  Further recommendations to follow  Leukocytosis.  Due to above.  ESRD-HD MWF via left AV fistula  Diabetes mellitus.  Hemoglobin A1c 6.1  Severe gastroparesis.  Patient has PEG/J-tube.  GI following    Anti-infectives:   Vancomycin   Ceftriaxone     Subjective:   Date of Consultation:  August 10, 2024  Date of Admission: 08/06/2024   Referring Physician:     Patient is a 43 y.o. female with a past medical history significant for end-stage renal disease on dialysis, hypertension, and gastroparesis who is being seen for bacteremia.    Patient was in her usual state of health until the day prior to admission when she developed severe diffuse abdominal pain associated with nausea.  The patient missed dialysis session due to vomiting.    Patient was admitted to [facility] for evaluation of nausea and vomiting.  Patient has a PEG/J-tube tube which she uses for medications.     Blood cultures drawn at the time of admission have returned growing Staphylococcus aureus.  This appears to be MSSA based on PCR testing.  She is currently on vancomycin  and ceftriaxone .  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)    Intractable abdominal pain    Intractable vomiting    Gastroparesis    ESRD (end stage  renal disease) on dialysis (HCC)    Acute hyperkalemia    ESRD (end stage renal disease) (HCC)    Type 2 diabetes mellitus with chronic kidney disease on chronic dialysis, with long-term current use of insulin  [facility])    Hypertensive emergency     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   Social History     Tobacco Use    Smoking status: Never    Smokeless tobacco: Never   Substance Use Topics    Alcohol use: Never     Past Surgical History:   Procedure Laterality Date    CAPSULE ENDOSCOPY N/A 01/20/2023    ESOPHAGEAL CAPSULE ENDOSCOPY remove at 1624PM performed by Madlyn Fendt, MD at [facility] ENDOSCOPY    CHOLECYSTECTOMY, LAPAROSCOPIC N/A 02/03/2023    ROBOTIC LAPAROSCOPIC CHOLECYSTECTOMY with Indocyanine green  performed by Chrystal Elijah BRAVO, MD at [facility] MAIN OR    COLONOSCOPY N/A 01/19/2023    COLONOSCOPY DIAGNOSTIC performed by Madlyn Fendt, MD at [facility] ENDOSCOPY    INVASIVE VASCULAR N/A 10/03/2023    Angiography  visceral SMA performed by Millard Oneil LABOR, MD at [facility] CARDIAC CATH LAB    INVASIVE VASCULAR N/A 10/03/2023    Ultrasound guided vascular access performed by Millard Oneil LABOR, MD at [facility] CARDIAC CATH LAB    INVASIVE VASCULAR N/A 10/03/2023    Insert stent peripheral artery performed by Millard Oneil LABOR, MD at [facility] CARDIAC CATH LAB    IR NONTUNNELED VASCULAR CATHETER > 5 YEARS  10/10/2023    IR NONTUNNELED VASCULAR CATHETER > 5 YEARS 10/10/2023 [facility] CARDIAC CATH/EP/IR LAB    IR NONTUNNELED VASCULAR CATHETER > 5 YEARS  10/10/2023    IR NONTUNNELED VASCULAR CATHETER > 5 YEARS 10/10/2023 [facility] CARDIAC CATH/EP/IR LAB    OTHER SURGICAL HISTORY Left     Rentia attachment    TUBAL LIGATION Bilateral     UPPER GASTROINTESTINAL ENDOSCOPY N/A 01/17/2023    ESOPHAGOGASTRODUODENOSCOPY performed by Madlyn Fendt,  MD at [facility] ENDOSCOPY    UPPER GASTROINTESTINAL ENDOSCOPY N/A 01/17/2023    ESOPHAGOGASTRODUODENOSCOPY BIOPSY performed by Madlyn Fendt, MD at [facility] ENDOSCOPY    UPPER GASTROINTESTINAL ENDOSCOPY N/A 01/18/2023    ESOPHAGOGASTRODUODENOSCOPY performed by Madlyn Fendt, MD at [facility] ENDOSCOPY    UPPER GASTROINTESTINAL ENDOSCOPY N/A 05/13/2023    ESOPHAGOGASTRODUODENOSCOPY performed by Roseann Lonni BIRCH, MD at [facility] ENDOSCOPY    UPPER GASTROINTESTINAL ENDOSCOPY N/A 05/13/2023    ESOPHAGOGASTRODUODENOSCOPY BIOPSY performed by Roseann Lonni BIRCH, MD at [facility] ENDOSCOPY    UPPER GASTROINTESTINAL ENDOSCOPY N/A 09/21/2023    ESOPHAGOGASTRODUODENOSCOPY performed by Madlyn Fendt, MD at [facility] ENDOSCOPY    UPPER GASTROINTESTINAL ENDOSCOPY N/A 09/21/2023    ESOPHAGOGASTRODUODENOSCOPY BIOPSY performed by Madlyn Fendt, MD at [facility] ENDOSCOPY    US  FLUID COLLECTION DRAINAGE PERITONEAL/RETROPERITONEAL [facility]  02/11/2023    US  ABSCESS DRAINAGE PERITONEAL 02/11/2023 [facility] RAD US       Prior to Admission medications   Medication Sig Start Date End Date Taking? Authorizing Provider   metoclopramide  (REGLAN ) 5 MG tablet Take 1 tablet by mouth 3 times daily as needed (nausea/vomiting) 07/20/24 07/25/24  Cutchins, Abigail M, PA-C   lidocaine  4 % external patch Place 1 patch onto the skin daily 07/02/24   Karenann Barter, MD   sevelamer  (RENVELA ) 0.8 g PACK packet Take 1 packet by mouth 3 times daily (with meals) 07/02/24   Karenann Barter, MD   atorvastatin  (LIPITOR ) 80 MG tablet Take 1 tablet by mouth nightly    [provider]   carvedilol  (COREG ) 12.5 MG tablet Take 1 tablet by mouth 2 times daily (with meals)    [provider]   famotidine  (PEPCID ) 20 MG tablet Take 1 tablet by mouth 2 times daily    [provider]   hydrALAZINE  (APRESOLINE ) 50 MG tablet Take 1 tablet by mouth 3 times daily    [provider]   sodium bicarbonate  650 MG tablet Take 1 tablet by mouth 3 times daily 02/23/24   Muncy, Manuelita Bohr, MD    pantoprazole  (PROTONIX ) 40 MG tablet Take 1 tablet by mouth 2 times daily (before meals) 02/23/24   Muncy, Manuelita Bohr, MD   sennosides-docusate sodium  (SENOKOT-S) 8.6-50 MG tablet Take 1 tablet by mouth in the morning and at bedtime 11/06/23   Do, Khoi B, MD   ondansetron  (ZOFRAN -ODT) 4 MG disintegrating tablet Take 1 tablet by mouth every 8 hours as needed for Nausea or Vomiting 11/06/23   Do, Khoi B, MD   prochlorperazine  (COMPAZINE ) 10 MG tablet Take 1 tablet by mouth every 6 hours as needed (nausea or vomiting)  10/25/23   Aktig, Ziya, MD   metFORMIN  (GLUCOPHAGE -XR) 500 MG extended release tablet Take 2 tablets by mouth daily (with breakfast)  Patient not taking: Reported on 06/26/2024 10/25/23   Aktig, Ziya, MD   busPIRone  (BUSPAR ) 15 MG tablet Take 15 mg by mouth 3 times daily  Patient taking differently: Take 5 mg by mouth in the morning and at bedtime 10/14/23   Tefera, Mesfin A, MD   DULoxetine  (CYMBALTA ) 60 MG extended release capsule Take 1 capsule by mouth daily 10/15/23   Tefera, Mesfin A, MD   clopidogrel  (PLAVIX ) 75 MG tablet Take 1 tablet by mouth daily 10/15/23   Tefera, Mesfin A, MD   aspirin  81 MG chewable tablet Take 1 tablet by mouth daily 10/15/23   Tefera, Mesfin A, MD   amLODIPine  (NORVASC ) 10 MG tablet Take 1 tablet by mouth daily 03/23/23   Do, Khoi B, MD     No Known Allergies     Review of Systems:  Pertinent items are noted in the History of Present Illness.    Objective:   Blood pressure 111/64, pulse 89, temperature 98.7 F (37.1 C), resp. rate 16, height 1.499 m (4' 11.02), weight 54.6 kg (120 lb 5.9 oz), SpO2 91%.  Temp (24hrs), Avg:98.5 F (36.9 C), Min:98.1 F (36.7 C), Max:99.3 F (37.4 C)       Exam:    General:  Alert, cooperative, well noursished, well developed, appears stated age   Eyes:  Sclera anicteric.   Mouth/Throat: Mucous membranes normal   Neck: Supple   Lungs:   Clear to auscultation anteriorly   CV:  Regular rate and rhythm.  3/6 SEM   Abdomen:   G-tube in place    Extremities: Moves all.   Skin: No acute rash on exposed skin   Lymph nodes:    Musculoskeletal:    Lines/Devices:  Left upper extremity AV fistula site unremarkable   Psych: Alert and oriented, normal mood affect given the setting       Data Review:   Recent Results (from the past 24 hours)   POCT Glucose    Collection Time: 08/09/24  4:31 PM   Result Value Ref Range    POC Glucose 131 (H) 65 - 117 mg/dL    Performed by: JAROLD CRANE    POCT Glucose    Collection Time: 08/09/24 10:31 PM   Result Value Ref Range    POC Glucose 145 (H) 65 - 117 mg/dL    Performed by: Wanamaker Caitlin    CBC with Auto Differential    Collection Time: 08/10/24  2:06 AM   Result Value Ref Range    WBC 14.2 (H) 3.6 - 11.0 K/uL    RBC 3.22 (L) 3.80 - 5.20 M/uL    Hemoglobin 9.6 (L) 11.5 - 16.0 g/dL    Hematocrit 67.8 (L) 35.0 - 47.0 %    MCV 99.7 (H) 80.0 - 99.0 FL    MCH 29.8 26.0 - 34.0 PG    MCHC 29.9 (L) 30.0 - 36.5 g/dL    RDW 85.6 88.4 - 85.4 %    Platelets 262 150 - 400 K/uL    MPV 9.1 8.9 - 12.9 FL    Nucleated RBCs 0.0 0 PER 100 WBC    nRBC 0.00 0.00 - 0.01 K/uL    Neutrophils % 87.4 (H) 32.0 - 75.0 %    Lymphocytes % 5.4 (L) 12.0 - 49.0 %    Monocytes % 5.2 5.0 - 13.0 %  Eosinophils % 1.3 0.0 - 7.0 %    Basophils % 0.3 0.0 - 1.0 %    Immature Granulocytes % 0.4 0.0 - 0.5 %    Neutrophils Absolute 12.41 (H) 1.80 - 8.00 K/UL    Lymphocytes Absolute 0.77 (L) 0.80 - 3.50 K/UL    Monocytes Absolute 0.74 0.00 - 1.00 K/UL    Eosinophils Absolute 0.18 0.00 - 0.40 K/UL    Basophils Absolute 0.04 0.00 - 0.10 K/UL    Immature Granulocytes Absolute 0.06 (H) 0.00 - 0.04 K/UL    Differential Type SMEAR SCANNED      RBC Comment MACROCYTOSIS  1+       Comprehensive Metabolic Panel    Collection Time: 08/10/24  2:06 AM   Result Value Ref Range    Sodium 138 136 - 145 mmol/L    Potassium 3.9 3.5 - 5.1 mmol/L    Chloride 100 98 - 107 mmol/L    CO2 22 20 - 29 mmol/L    Anion Gap 17 (H) 2 - 14 mmol/L    Glucose 127 (H) 65 - 100 mg/dL    BUN  21 (H) 6 - 20 MG/DL    Creatinine 5.23 (H) 0.60 - 1.00 MG/DL    BUN/Creatinine Ratio 4 (L) 12 - 20      Est, Glom Filt Rate 11 (L) >59 ml/min/1.44m2    Calcium  8.0 (L) 8.6 - 10.0 MG/DL    Total Bilirubin 0.3 0.0 - 1.2 MG/DL    ALT 22 10 - 35 U/L    AST 19 10 - 35 U/L    Alk Phosphatase 172 (H) 35 - 104 U/L    Total Protein 6.2 (L) 6.4 - 8.3 g/dL    Albumin  2.8 (L) 3.5 - 5.2 g/dL    Globulin 3.4 2.0 - 4.0 g/dL    Albumin /Globulin Ratio 0.8 (L) 1.1 - 2.2     Culture, Blood 1    Collection Time: 08/10/24  2:06 AM    Specimen: Blood   Result Value Ref Range    Special Requests NO SPECIAL REQUESTS  RIGHT  HAND        Culture NO GROWTH AFTER 5 HOURS     Culture, Blood 2    Collection Time: 08/10/24  3:00 AM    Specimen: Blood   Result Value Ref Range    Special Requests RIGHT  ARM        Culture NO GROWTH AFTER 4 HOURS     POCT Glucose    Collection Time: 08/10/24  9:03 AM   Result Value Ref Range    POC Glucose 126 (H) 65 - 117 mg/dL    Performed by: CHESLEY SEATS (PCT)    POCT Glucose    Collection Time: 08/10/24 11:11 AM   Result Value Ref Range    POC Glucose 128 (H) 65 - 117 mg/dL    Performed by: CHESLEY SEATS (PCT)         Microbiology:      Studies:      A total time of 75 minutes was spent on today's encounter.  Greater than 50% of the time was spent on the following:  Preparing for visit and chart review.  Obtaining and/or reviewing separately obtained history  Performing a medically appropriate exam and/or evaluation  Counseling and educating a patient/family/caregiver as noted above  Placing relevant orders  Referring and communicating with other professionals (not separately reported)  Independently interpreting results (not separately reported) and communicating results to  the patient/family/caregiver  Care coordination (not separately reported) as noted above  Documenting clinical information in the electronic health records (e.g. problem list, visit note) on the day of the encounter     Signed By: Oneil Spencer, DO     August 10, 2024         "

## 2024-08-10 NOTE — Progress Notes (Signed)
"  [facility] Dosing Services    Ceftriaxone  was automatically dose-adjusted per [facility] P&T Committee Protocol, with respect to indication.    Assessment/Plan: Ceftriaxone  dose adjusted to 2,000 mg IV every 24 hours per P&T protocol for indication of bacteremia.     Pharmacist RANDALL JONELLE ALAMIN, RPH    "

## 2024-08-10 NOTE — Progress Notes (Signed)
 "        NEPHROLOGY CONSULT NOTE     Patient: Melinda [name] MRN: [id number]  PCP: No primary care provider on file.   DOB:     April 21, 1981  Age:   43 y.o.  Sex:  female      Referring physician: Lyell, Prentice BIRCH, MD  Reason for consultation:   Admission Date: 08/06/2024  3:18 PM  LOS: 4 days        ASSESSMENT and PLAN :   ESRD on HD MWF via LAVG, [facility], f/b Dr. Kalvin  Hypertensive urgency-better after HD  DM  AOCD  Chronic abd pain, n/v from gastroparesis-reason for admit  Staph bacteremia from both sites-She has LAVG    Plan:  She has chronic back pain-may want to investigate further given her bacteremia  She does not appear to have cellulitis etc of her access site  Abx as per primary team  Her UF is limited by soft pressures-she drops on HD  Bps often in the 200s at the start of HD  Would give BP meds after HD on HD days  Epo with HD  Supportive care for gastroparesis  Happy to help arrange OP abx with HD as she approaches dc readiness  Available as needed over the weekend       Subjective:   HPI:   10/29: Seen on HD. Bps are actually low in the 90s. Has ongoing nausea, abd pain, back pain.     Melinda [name] is a 43 y.o. Hispanic female with ESRD on HD MWF at [facility], DM with gastroparesis, HTN presenting to the ED from HD for worsening abd pain, n/v. Has supplemental PEG, has been trying to sip on soup, uses PEG for meds. Missed HD yesterday, arrived with SBPs in the 200s. We are consulted for HD needs.     Had HD overnight, Bps are better. Pt states her nausea and abd pain have not improved.     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)     Gastroparesis     Gastroparesis     GERD (gastroesophageal reflux disease)     High cholesterol     Hypertension         Past Surgical Hx:     Past Surgical History:   Procedure Laterality Date    CAPSULE ENDOSCOPY N/A 01/20/2023    ESOPHAGEAL CAPSULE  ENDOSCOPY remove at 1624PM performed by Madlyn Fendt, MD at [facility] ENDOSCOPY    CHOLECYSTECTOMY, LAPAROSCOPIC N/A 02/03/2023    ROBOTIC LAPAROSCOPIC CHOLECYSTECTOMY with Indocyanine green  performed by Chrystal Elijah BRAVO, MD at [facility] MAIN OR    COLONOSCOPY N/A 01/19/2023    COLONOSCOPY DIAGNOSTIC performed by Madlyn Fendt, MD at [facility] ENDOSCOPY    INVASIVE VASCULAR N/A 10/03/2023    Angiography visceral SMA performed by Millard Oneil LABOR, MD at [facility] CARDIAC CATH LAB    INVASIVE VASCULAR N/A 10/03/2023    Ultrasound guided vascular access performed by Millard Oneil LABOR, MD at [facility] CARDIAC CATH LAB    INVASIVE VASCULAR N/A 10/03/2023    Insert stent peripheral artery performed by Millard Oneil LABOR, MD at [facility] CARDIAC CATH LAB    IR NONTUNNELED VASCULAR CATHETER > 5 YEARS  10/10/2023    IR NONTUNNELED VASCULAR CATHETER > 5 YEARS 10/10/2023 [facility] CARDIAC CATH/EP/IR LAB    IR NONTUNNELED VASCULAR  CATHETER > 5 YEARS  10/10/2023    IR NONTUNNELED VASCULAR CATHETER > 5 YEARS 10/10/2023 [facility] CARDIAC CATH/EP/IR LAB    OTHER SURGICAL HISTORY Left     Rentia attachment    TUBAL LIGATION Bilateral     UPPER GASTROINTESTINAL ENDOSCOPY N/A 01/17/2023    ESOPHAGOGASTRODUODENOSCOPY performed by Madlyn Fendt, MD at [facility] ENDOSCOPY    UPPER GASTROINTESTINAL ENDOSCOPY N/A 01/17/2023    ESOPHAGOGASTRODUODENOSCOPY BIOPSY performed by Madlyn Fendt, MD at [facility] ENDOSCOPY    UPPER GASTROINTESTINAL ENDOSCOPY N/A 01/18/2023    ESOPHAGOGASTRODUODENOSCOPY performed by Madlyn Fendt, MD at [facility] ENDOSCOPY    UPPER GASTROINTESTINAL ENDOSCOPY N/A 05/13/2023    ESOPHAGOGASTRODUODENOSCOPY performed by Roseann Lonni BIRCH, MD at [facility] ENDOSCOPY    UPPER GASTROINTESTINAL ENDOSCOPY N/A 05/13/2023    ESOPHAGOGASTRODUODENOSCOPY BIOPSY performed by Roseann Lonni BIRCH, MD at [facility] ENDOSCOPY    UPPER GASTROINTESTINAL ENDOSCOPY N/A 09/21/2023    ESOPHAGOGASTRODUODENOSCOPY performed by Madlyn Fendt, MD at [facility] ENDOSCOPY    UPPER GASTROINTESTINAL ENDOSCOPY N/A 09/21/2023     ESOPHAGOGASTRODUODENOSCOPY BIOPSY performed by Madlyn Fendt, MD at [facility] ENDOSCOPY    US  FLUID COLLECTION DRAINAGE PERITONEAL/RETROPERITONEAL [facility]  02/11/2023    US  ABSCESS DRAINAGE PERITONEAL 02/11/2023 [facility] RAD US        Medications:  Prior to Admission medications   Medication Sig Start Date End Date Taking? Authorizing Provider   metoclopramide  (REGLAN ) 5 MG tablet Take 1 tablet by mouth 3 times daily as needed (nausea/vomiting) 07/20/24 07/25/24  Cutchins, Abigail M, PA-C   lidocaine  4 % external patch Place 1 patch onto the skin daily 07/02/24   Karenann Barter, MD   sevelamer  (RENVELA ) 0.8 g PACK packet Take 1 packet by mouth 3 times daily (with meals) 07/02/24   Karenann Barter, MD   atorvastatin  (LIPITOR ) 80 MG tablet Take 1 tablet by mouth nightly    [provider]   carvedilol  (COREG ) 12.5 MG tablet Take 1 tablet by mouth 2 times daily (with meals)    [provider]   famotidine  (PEPCID ) 20 MG tablet Take 1 tablet by mouth 2 times daily    [provider]   hydrALAZINE  (APRESOLINE ) 50 MG tablet Take 1 tablet by mouth 3 times daily    [provider]   sodium bicarbonate  650 MG tablet Take 1 tablet by mouth 3 times daily 02/23/24   Muncy, Manuelita Bohr, MD   pantoprazole  (PROTONIX ) 40 MG tablet Take 1 tablet by mouth 2 times daily (before meals) 02/23/24   Muncy, Manuelita Bohr, MD   sennosides-docusate sodium  (SENOKOT-S) 8.6-50 MG tablet Take 1 tablet by mouth in the morning and at bedtime 11/06/23   Do, Khoi B, MD   ondansetron  (ZOFRAN -ODT) 4 MG disintegrating tablet Take 1 tablet by mouth every 8 hours as needed for Nausea or Vomiting 11/06/23   Do, Rodrick NOVAK, MD   prochlorperazine  (COMPAZINE ) 10 MG tablet Take 1 tablet by mouth every 6 hours as needed (nausea or vomiting) 10/25/23   Orson Planas, MD   metFORMIN  (GLUCOPHAGE -XR) 500 MG extended release tablet Take 2 tablets by mouth daily (with breakfast)  Patient not taking: Reported on 06/26/2024 10/25/23   Aktig, Ziya, MD   busPIRone   (BUSPAR ) 15 MG tablet Take 15 mg by mouth 3 times daily  Patient taking differently: Take 5 mg by mouth in the morning and at bedtime 10/14/23   Tefera, Mesfin A, MD   DULoxetine  (CYMBALTA ) 60 MG extended release capsule Take 1 capsule by mouth daily 10/15/23   Tefera, Mesfin  A, MD   clopidogrel  (PLAVIX ) 75 MG tablet Take 1 tablet by mouth daily 10/15/23   Tefera, Mesfin A, MD   aspirin  81 MG chewable tablet Take 1 tablet by mouth daily 10/15/23   Tefera, Mesfin A, MD   amLODIPine  (NORVASC ) 10 MG tablet Take 1 tablet by mouth daily 03/23/23   Do, Khoi B, MD       No Known Allergies    Social Hx:  reports that she has never smoked. She has never used smokeless tobacco. She reports that she does not drink alcohol and does not use drugs.     No family history on file.    Review of Systems:  A twelve point review of system was performed today. Pertinent positives and negatives are mentioned in the HPI. The reminder of the ROS is negative and noncontributory.     Objective:    Vitals:    Vitals:    08/10/24 1330 08/10/24 1345 08/10/24 1400 08/10/24 1415   BP: 128/67 133/69 133/74 126/69   Pulse: 89 89 90 89   Resp:       Temp:       TempSrc:       SpO2:       Weight:       Height:         I&O's:  10/30 0701 - 10/31 0700  In: 60 [P.O.:60]  Out: -   BP 126/69   Pulse 89   Temp 98.7 F (37.1 C)   Resp 16   Ht 1.499 m (4' 11.02)   Wt 54.6 kg (120 lb 5.9 oz)   SpO2 91%   BMI 24.30 kg/m     Physical Exam:    GENERAL :lying in bed, chronically ill, seen on HD  HEENT: AT NC PEERLA   NECK: Supple no JVP  CVS: RRR  RS: CTABL, no rhonchi,or wheezing heard, no distress  ABDOMEN: soft NT ND positive BS, PEG  EXTREMITY: No edema clubbing or cyanosis, pedal pulse +  NEUROLOGY: AAA X3, no focal deficit or asterixis  HD access: L AVF    Laboratory Results:      Lab Results   Component Value Date/Time    NA 138 08/10/2024 02:06 AM    K 3.9 08/10/2024 02:06 AM    CL 100 08/10/2024 02:06 AM    CO2 22 08/10/2024 02:06 AM    BUN 21  08/10/2024 02:06 AM    CREATININE 4.76 08/10/2024 02:06 AM    GLUCOSE 127 08/10/2024 02:06 AM    CALCIUM  8.0 08/10/2024 02:06 AM          @LASTPROCAMB (POC81001;POC81003;poc81002;poc81000;POC81001F;POC81003E)@  No results found for: LABPROT, LABALBU     Lab Results   Component Value Date    WBC 14.2 (H) 08/10/2024    HGB 9.6 (L) 08/10/2024    HCT 32.1 (L) 08/10/2024    MCV 99.7 (H) 08/10/2024    PLT 262 08/10/2024    LYMPHOPCT 5.4 (L) 08/10/2024    RBC 3.22 (L) 08/10/2024    MCH 29.8 08/10/2024    MCHC 29.9 (L) 08/10/2024    RDW 14.3 08/10/2024                   Damien FORBES Leos, PA-C  08/10/2024    Thank you for consulting [facility] in the care of your patient.    [facility]:  [email or web address]  [email or web address]          "

## 2024-08-11 ENCOUNTER — Inpatient Hospital Stay: Payer: PRIVATE HEALTH INSURANCE

## 2024-08-11 LAB — COMPREHENSIVE METABOLIC PANEL
ALT: 14 U/L (ref 10–35)
Albumin/Globulin Ratio: 0.8 — ABNORMAL LOW (ref 1.1–2.2)
Albumin: 2.8 g/dL — ABNORMAL LOW (ref 3.5–5.2)
Alk Phosphatase: 145 U/L — ABNORMAL HIGH (ref 35–104)
Anion Gap: 12 mmol/L (ref 2–14)
BUN/Creatinine Ratio: 4 — ABNORMAL LOW (ref 12–20)
BUN: 11 mg/dL (ref 6–20)
CO2: 26 mmol/L (ref 20–29)
Calcium: 8.2 mg/dL — ABNORMAL LOW (ref 8.6–10.0)
Chloride: 97 mmol/L — ABNORMAL LOW (ref 98–107)
Creatinine: 3.1 mg/dL — ABNORMAL HIGH (ref 0.60–1.00)
Est, Glom Filt Rate: 19 ml/min/1.73m2 — ABNORMAL LOW (ref >59)
Globulin: 3.5 g/dL (ref 2.0–4.0)
Glucose: 129 mg/dL — ABNORMAL HIGH (ref 65–100)
Sodium: 135 mmol/L — ABNORMAL LOW (ref 136–145)
Total Bilirubin: 0.2 mg/dL (ref 0.0–1.2)
Total Protein: 6.3 g/dL — ABNORMAL LOW (ref 6.4–8.3)

## 2024-08-11 LAB — CBC WITH AUTO DIFFERENTIAL
Basophils %: 0.5 % (ref 0.0–1.0)
Basophils Absolute: 0.04 K/UL (ref 0.00–0.10)
Eosinophils %: 2 % (ref 0.0–7.0)
Eosinophils Absolute: 0.15 K/UL (ref 0.00–0.40)
Hematocrit: 31.1 % — ABNORMAL LOW (ref 35.0–47.0)
Hemoglobin: 9.6 g/dL — ABNORMAL LOW (ref 11.5–16.0)
Immature Granulocytes %: 0.7 % — ABNORMAL HIGH (ref 0.0–0.5)
Immature Granulocytes Absolute: 0.05 K/UL — ABNORMAL HIGH (ref 0.00–0.04)
Lymphocytes %: 13.1 % (ref 12.0–49.0)
Lymphocytes Absolute: 0.99 K/UL (ref 0.80–3.50)
MCH: 30.1 pg (ref 26.0–34.0)
MCHC: 30.9 g/dL (ref 30.0–36.5)
MCV: 97.5 FL (ref 80.0–99.0)
MPV: 9.7 FL (ref 8.9–12.9)
Monocytes %: 10.6 % (ref 5.0–13.0)
Monocytes Absolute: 0.8 K/UL (ref 0.00–1.00)
Neutrophils %: 73.1 % (ref 32.0–75.0)
Neutrophils Absolute: 5.55 K/UL (ref 1.80–8.00)
Nucleated RBCs: 0 /100{WBCs}
Platelets: 303 K/uL (ref 150–400)
RBC: 3.19 M/uL — ABNORMAL LOW (ref 3.80–5.20)
RDW: 14.4 % (ref 11.5–14.5)
WBC: 7.6 K/uL (ref 3.6–11.0)
nRBC: 0 K/uL (ref 0.00–0.01)

## 2024-08-11 LAB — POCT GLUCOSE
POC Glucose: 155 mg/dL — ABNORMAL HIGH (ref 65–117)
POC Glucose: 124 mg/dL — ABNORMAL HIGH (ref 65–117)
POC Glucose: 118 mg/dL — ABNORMAL HIGH (ref 65–117)
POC Glucose: 109 mg/dL (ref 65–117)

## 2024-08-11 MED ORDER — GADOTERIDOL 279.3 MG/ML IV SOLN
279.3 | Freq: Once | INTRAVENOUS | Status: DC | PRN
Start: 2024-08-11 — End: 2024-08-12

## 2024-08-11 MED FILL — PANTOPRAZOLE SODIUM 40 MG IV SOLR: 40 mg | INTRAVENOUS | Qty: 40 | Fill #0

## 2024-08-11 MED FILL — ASPIRIN 81 MG PO CHEW: 81 mg | ORAL | Qty: 1 | Fill #0

## 2024-08-11 MED FILL — OXYCODONE HCL 5 MG PO TABS: 5 mg | ORAL | Qty: 2 | Fill #0

## 2024-08-11 MED FILL — VITAMIN B-6 50 MG PO TABS: 50 mg | ORAL | Qty: 1 | Fill #0

## 2024-08-11 MED FILL — SODIUM BICARBONATE 650 MG PO TABS: 650 mg | ORAL | Qty: 1 | Fill #0

## 2024-08-11 MED FILL — BUSPIRONE HCL 5 MG PO TABS: 5 mg | ORAL | Qty: 1 | Fill #0

## 2024-08-11 MED FILL — CLOPIDOGREL BISULFATE 75 MG PO TABS: 75 mg | ORAL | Qty: 1 | Fill #0

## 2024-08-11 MED FILL — HEPARIN SODIUM (PORCINE) 5000 UNIT/ML IJ SOLN: 5000 [IU]/mL | INTRAMUSCULAR | Qty: 1 | Fill #0

## 2024-08-11 MED FILL — MORPHINE SULFATE 4 MG/ML IJ SOLN: 4 mg/mL | INTRAMUSCULAR | Qty: 1 | Fill #0

## 2024-08-11 MED FILL — HYDRALAZINE HCL 25 MG PO TABS: 25 mg | ORAL | Qty: 2 | Fill #0

## 2024-08-11 MED FILL — SEVELAMER CARBONATE 0.8 G PO PACK: 0.8 g | ORAL | Qty: 1 | Fill #0

## 2024-08-11 MED FILL — CEFTRIAXONE SODIUM 2 G IJ SOLR: 2 g | INTRAMUSCULAR | Qty: 2000 | Fill #0

## 2024-08-11 MED FILL — DULOXETINE HCL 30 MG PO CPEP: 30 mg | ORAL | Qty: 2 | Fill #0

## 2024-08-11 MED FILL — SENNOSIDES-DOCUSATE SODIUM 8.6-50 MG PO TABS: 8.6-50 mg | ORAL | Qty: 1 | Fill #0

## 2024-08-11 MED FILL — ATORVASTATIN CALCIUM 20 MG PO TABS: 20 mg | ORAL | Qty: 4 | Fill #0

## 2024-08-11 MED FILL — AMLODIPINE BESYLATE 5 MG PO TABS: 5 mg | ORAL | Qty: 2 | Fill #0

## 2024-08-11 MED FILL — FAMOTIDINE 20 MG PO TABS: 20 mg | ORAL | Qty: 1 | Fill #0

## 2024-08-11 MED FILL — PROHANCE 279.3 MG/ML IV SOLN: 279.3 mg/mL | INTRAVENOUS | Qty: 20 | Fill #0

## 2024-08-11 MED FILL — PROCHLORPERAZINE EDISYLATE 10 MG/2ML IJ SOLN: 10 MG/2ML | INTRAMUSCULAR | Qty: 2 | Fill #0

## 2024-08-11 MED FILL — CARVEDILOL 12.5 MG PO TABS: 12.5 mg | ORAL | Qty: 1 | Fill #0

## 2024-08-11 NOTE — Progress Notes (Signed)
"  Ultrasound IV by  :  Procedure Note    Ultrasound IV education provided to patient. Opportunities for questions given.     Attempted US  IV x3 patient has minimal vessels suitable for vascular access.  Asked bedside RN to reach out to MD on call to notify.    Primary RN aware of IV removal on right lower arm and added to LDA.      RONAL FORBES ALERT, RN   "

## 2024-08-11 NOTE — Plan of Care (Signed)
"    Problem: Chronic Conditions and Co-morbidities  Goal: Patient's chronic conditions and co-morbidity symptoms are monitored and maintained or improved  08/11/2024 1224 by Addie Birmingham, RN  Outcome: Progressing  08/10/2024 2336 by Jaime Chestnut, RN  Outcome: Progressing     Problem: Discharge Planning  Goal: Discharge to home or other facility with appropriate resources  08/11/2024 1224 by Addie Birmingham, RN  Outcome: Progressing  08/10/2024 2336 by Jaime Chestnut, RN  Outcome: Progressing     Problem: Pain  Goal: Verbalizes/displays adequate comfort level or baseline comfort level  08/11/2024 1224 by Addie Birmingham, RN  Outcome: Progressing  08/10/2024 2336 by Jaime Chestnut, RN  Outcome: Progressing     Problem: Safety - Adult  Goal: Free from fall injury  08/11/2024 1224 by Addie Birmingham, RN  Outcome: Progressing  08/10/2024 2336 by Jaime Chestnut, RN  Outcome: Progressing     Problem: Nutrition Deficit:  Goal: Optimize nutritional status  08/11/2024 1224 by Addie Birmingham, RN  Outcome: Progressing  08/10/2024 2336 by Jaime Chestnut, RN  Outcome: Progressing     Problem: Gastrointestinal - Adult  Goal: Minimal or absence of nausea and vomiting  08/11/2024 1224 by Addie Birmingham, RN  Outcome: Progressing  08/10/2024 2336 by Jaime Chestnut, RN  Outcome: Progressing  Goal: Maintains or returns to baseline bowel function  08/11/2024 1224 by Addie Birmingham, RN  Outcome: Progressing  08/10/2024 2336 by Jaime Chestnut, RN  Outcome: Progressing  Goal: Maintains adequate nutritional intake  08/11/2024 1224 by Addie Birmingham, RN  Outcome: Progressing  08/10/2024 2336 by Jaime Chestnut, RN  Outcome: Progressing     Problem: Skin/Tissue Integrity  Goal: Skin integrity remains intact  Description: 1.  Monitor for areas of redness and/or skin breakdown  2.  Assess vascular access sites hourly  3.  Every 4-6 hours minimum:  Change oxygen saturation probe site  4.  Every 4-6 hours:  If on nasal continuous  positive airway pressure, respiratory therapy assess nares and determine need for appliance change or resting period  08/11/2024 1224 by Addie Birmingham, RN  Outcome: Progressing  08/10/2024 2336 by Jaime Chestnut, RN  Outcome: Progressing     "

## 2024-08-11 NOTE — Dialysis (Signed)
"  Hep B verification performed for (RN): oncoming RN  Hep B results, dates, and Primary Source: negative/susceptible   08/07/2024  Epic  Hepatitis B Surface Ag   Date/Time Value Ref Range Status   08/07/2024 01:38 AM NONREACTIVE NR   Final     Hep B S Ag Interp   Date/Time Value Ref Range Status   03/16/2023 08:19 AM Negative NEG   Final     Hep B S Ab   Date/Time Value Ref Range Status   08/07/2024 01:38 AM <3.50 mIU/mL Final     Comment:        <10.00 mIU/mL     Non-Immune  =>10.00 mIU/mL    Individual is considered to be immune to infection with HBV       Machine disinfect process:Acid/Heat  "

## 2024-08-11 NOTE — Progress Notes (Signed)
 "    Hospitalist Progress Note      NAME:  Melinda [name]   DOB:  1981/08/04  MRM:  [id number]    Date/Time: 08/11/2024  8:54 AM           Assessment / Plan:     43 y.o. female with past medical history of ESRD on HD MWF, DM2, HTN, gastroparesis requiring JG tube admitted for abdominal pain. Hospital stay complicated by sepsis with staph aureus bacteremia.        Sepsis/Staph aureus bacteremia:  UTI  -UA with 4+ bacteria, + blood and leukocytes, few epi cells.    -culture with alpha streptococcus  -blood cultures with staph aureus bacteremia   -repeat collected 10/31 with NGTD  -continue vanc and rocephin   - ID consulted- likely MSSA  - ECHO ordered  - MRI entire spine ordered  - CT of graft on left arm ordered to rule out abscess at fistula site      # Diffuse abdominal pain with nausea and vomiting: Acute, present admission.  Patient has been admitted repeatedly for this in the past.  Her symptoms previously have been attributed to gastroparesis.  Patient does have a GJ tube.  CT abdomen/pelvis without acute process.  -GI following:  - N.p.o. for bowel rest  - Normal saline IV fluids as patient does make urine  -Patient has had no improvement with Reglan .  QTc is too prolonged to do erythromycin . -- continue Compazine   -- B6 25 mg every 6 hours  --Benadryl  does seem to be helping some  - continue IV Protonix   --TSH is mildly elevated this is unlikely to contribute to patient's symptoms.  Needs outpatient follow-up for this.        Prolonged Qtc: Repeat EKG today shows QTc of 502.  - continue Compazine   - Will avoid erythromycin  for now  - Repeat EKG still shows elevated QTc     Fentanyl  positive: Seen on drug screen.  May be a false positive in the setting of Benadryl  use.  Patient adamantly denies use.     # Hypertension POA. Improving  Will continue patient's home Coreg /hydralazine /amlodipine   Continue vitals per protocol     # #ESRD  On HD   Nephrology following     #GERD: Chronic.  Continue Pepcid .  IV  Protonix  while NPO.     # T2DM with gastroparesis. POA, chronic.  Last A1c of 6.1 on 9/17.  Sliding scale insulin .     # S/p GJ tube- Required GJ tube placement for adequate nutrition.  Nutrition consulted, will hold tube feeds for now     Chronic pain or anxiety/depression?:  Unclear why on duloxetine  but will continue for now.  Continue BuSpar .     DAPT: Unclear why patient is on dual antiplatelet therapy.  Will continue for now.  May need to be titrated off outpatient     Anemia: Trend.  Follow-up outpatient    #      #BMI (Calculated): 24.3    I have personally reviewed the radiographs, laboratory data in Epic and decisions and statements above are based partially on this personal interpretation.                 Care Plan discussed with: Patient and Consultant/Specialist    Discussed:  Care Plan    Prophylaxis:  SCD's    Disposition:  Home w/Family           ___________________________________________________    Provider: Andrez JONETTA Lee,  ACNP        Subjective:     Chief Complaint:  Pt reports continued abdominal pain and nausea.     ROS:  (bold if positive, if negative)    Tolerating PT  Tolerating Diet          Objective:       Vitals:          Last 24hrs VS reviewed since prior progress note. Most recent are:    Vitals:    08/11/24 0743   BP: (!) 168/77   Pulse: 88   Resp: 20   Temp: 98.8 F (37.1 C)   SpO2: 93%     SpO2 Readings from Last 6 Encounters:   08/11/24 93%   07/20/24 94%   07/11/24 98%   07/09/24 98%   07/05/24 95%   07/02/24 95%          Intake/Output Summary (Last 24 hours) at 08/11/2024 0854  Last data filed at 08/10/2024 2312  Gross per 24 hour   Intake 600 ml   Output 2500 ml   Net -1900 ml          Exam:     Physical Exam:    Gen:  Well-developed, well-nourished, appears uncomfortable.  HEENT:  Pink conjunctivae, hearing intact to voice  Neck:  Supple  Resp:  No accessory muscle use, clear breath sounds without wheezes rales or rhonchi  Card: 3/6 systolic murmur normal S1, S2 without thrills,  bruits or peripheral edema  Abd:  Soft, mild diffuse tenderness similar to yesterday.  Nondistended  Skin/MSK: Tenderness palpation of the mid to upper spine.  No rashes or ulcers, skin turgor is good  Neuro:  Cranial nerves 3-12 are grossly intact, follows commands appropriately  Psych:  Good insight, oriented to person, place and time, alert     Telemetry reviewed:   normal sinus rhythm    Medications Reviewed: (see below)    Lab Data Reviewed: (see below)    ______________________________________________________________________    Medications:     Current Facility-Administered Medications   Medication Dose Route Frequency    Vancomycin : Pharmacist dosing based on levels   Other RX Placeholder    cefTRIAXone  (ROCEPHIN ) 2,000 mg in sterile water  20 mL IV syringe  2,000 mg IntraVENous Q24H    [START ON 08/13/2024] Vancomycin  level 11/3 AM   Other Once    gadoteridol  (PROHANCE ) injection 10 mL  10 mL IntraVENous ONCE PRN    albumin  human 25% IV solution 25 g  25 g IntraVENous PRN    epoetin  alfa-epbx (RETACRIT ) injection 10,000 Units  10,000 Units SubCUTAneous Q MWF    vitamin B-6 (PYRIDOXINE ) tablet 25 mg  25 mg Oral Q6H    prochlorperazine  (COMPAZINE ) injection 10 mg  10 mg IntraVENous Q6H PRN    0.9 % sodium chloride  infusion   IntraVENous Continuous    famotidine  (PEPCID ) tablet 20 mg  20 mg Oral Daily    diphenhydrAMINE  (BENADRYL ) injection 12.5 mg  12.5 mg IntraVENous Q8H PRN    busPIRone  (BUSPAR ) tablet 5 mg  5 mg Oral BID    diazePAM  (VALIUM ) injection 5 mg  5 mg IntraVENous Q4H PRN    sodium chloride  flush 0.9 % injection 5-40 mL  5-40 mL IntraVENous 2 times per day    sodium chloride  flush 0.9 % injection 5-40 mL  5-40 mL IntraVENous PRN    0.9 % sodium chloride  infusion   IntraVENous PRN    heparin  (porcine) injection 5,000 Units  5,000 Units SubCUTAneous 3 times per day    polyethylene glycol (GLYCOLAX ) packet 17 g  17 g Oral Daily PRN    acetaminophen  (TYLENOL ) tablet 650 mg  650 mg Oral Q6H PRN    Or     acetaminophen  (TYLENOL ) suppository 650 mg  650 mg Rectal Q6H PRN    amLODIPine  (NORVASC ) tablet 10 mg  10 mg Oral Daily    aspirin  chewable tablet 81 mg  81 mg Oral Daily    atorvastatin  (LIPITOR ) tablet 80 mg  80 mg Oral QHS    carvedilol  (COREG ) tablet 12.5 mg  12.5 mg Oral BID WC    clopidogrel  (PLAVIX ) tablet 75 mg  75 mg Oral Daily    DULoxetine  (CYMBALTA ) extended release capsule 60 mg  60 mg Oral Daily    hydrALAZINE  (APRESOLINE ) tablet 50 mg  50 mg Oral TID    sennosides-docusate sodium  (SENOKOT-S) 8.6-50 MG tablet 1 tablet  1 tablet Oral BID    sevelamer  (RENVELA ) packet 0.8 g  0.8 g Oral TID WC    sodium bicarbonate  tablet 650 mg  650 mg Oral TID    oxyCODONE  (ROXICODONE ) immediate release tablet 5 mg  5 mg Oral Q4H PRN    Or    oxyCODONE  (ROXICODONE ) immediate release tablet 10 mg  10 mg Oral Q4H PRN    morphine  sulfate (PF) injection 2 mg  2 mg IntraVENous Q2H PRN    Or    morphine  sulfate (PF) injection 4 mg  4 mg IntraVENous Q2H PRN    insulin  lispro (HUMALOG ,ADMELOG ) injection vial 0-8 Units  0-8 Units SubCUTAneous 4x Daily AC & HS    pantoprazole  (PROTONIX ) 40 mg in sodium chloride  (PF) 0.9 % 10 mL injection  40 mg IntraVENous Daily            Lab Review:     Recent Labs     08/09/24  0159 08/10/24  0206 08/11/24  0220   WBC 15.9* 14.2* 7.6   HGB 10.1* 9.6* 9.6*   HCT 31.8* 32.1* 31.1*   PLT 275 262 303     Recent Labs     08/09/24  0159 08/10/24  0206 08/11/24  0220   NA 135* 138 135*   K 3.7 3.9 Hemolyzed, Recollection Recommended   CL 96* 100 97*   CO2 28 22 26    BUN 11 21* 11   ALT 33 22 14     No components found for: GLPOC      "

## 2024-08-11 NOTE — Plan of Care (Signed)
"    Problem: Chronic Conditions and Co-morbidities  Goal: Patient's chronic conditions and co-morbidity symptoms are monitored and maintained or improved  08/11/2024 2053 by Sherri Fallow, RN  Outcome: Progressing  08/11/2024 1224 by Addie Birmingham, RN  Outcome: Progressing     Problem: Discharge Planning  Goal: Discharge to home or other facility with appropriate resources  08/11/2024 2053 by Sherri Fallow, RN  Outcome: Progressing  08/11/2024 1224 by Addie Birmingham, RN  Outcome: Progressing     Problem: Pain  Goal: Verbalizes/displays adequate comfort level or baseline comfort level  08/11/2024 2053 by Sherri Fallow, RN  Outcome: Progressing  08/11/2024 1224 by Addie Birmingham, RN  Outcome: Progressing     Problem: Safety - Adult  Goal: Free from fall injury  08/11/2024 2053 by Sherri Fallow, RN  Outcome: Progressing  08/11/2024 1224 by Addie Birmingham, RN  Outcome: Progressing     Problem: Nutrition Deficit:  Goal: Optimize nutritional status  08/11/2024 2053 by Sherri Fallow, RN  Outcome: Progressing  08/11/2024 1224 by Addie Birmingham, RN  Outcome: Progressing     Problem: Gastrointestinal - Adult  Goal: Minimal or absence of nausea and vomiting  08/11/2024 2053 by Sherri Fallow, RN  Outcome: Progressing  08/11/2024 1224 by Addie Birmingham, RN  Outcome: Progressing  Goal: Maintains or returns to baseline bowel function  08/11/2024 2053 by Sherri Fallow, RN  Outcome: Progressing  08/11/2024 1224 by Addie Birmingham, RN  Outcome: Progressing  Goal: Maintains adequate nutritional intake  08/11/2024 2053 by Sherri Fallow, RN  Outcome: Progressing  08/11/2024 1224 by Addie Birmingham, RN  Outcome: Progressing     Problem: Skin/Tissue Integrity  Goal: Skin integrity remains intact  Description: 1.  Monitor for areas of redness and/or skin breakdown  2.  Assess vascular access sites hourly  3.  Every 4-6 hours minimum:  Change oxygen saturation probe site  4.  Every 4-6 hours:  If on nasal continuous positive airway pressure, respiratory therapy  assess nares and determine need for appliance change or resting period  08/11/2024 2053 by Sherri Fallow, RN  Outcome: Progressing  08/11/2024 1224 by Addie Birmingham, RN  Outcome: Progressing     "

## 2024-08-12 ENCOUNTER — Inpatient Hospital Stay: Admit: 2024-08-12 | Payer: PRIVATE HEALTH INSURANCE

## 2024-08-12 LAB — ECHO (TTE) COMPLETE (PRN CONTRAST/BUBBLE/STRAIN/3D)
AV Area by Peak Velocity: 2.2 cm2
AV Peak Gradient: 10 mmHg
AV Peak Velocity: 1.6 m/s
AV Velocity Ratio: 0.69
AVA/BSA Peak Velocity: 1.5 cm2/m2
Body Surface Area: 1.51 m2
E/E' Lateral: 13.78
E/E' Ratio (Averaged): 17.45
E/E' Septal: 21.11
EF Physician: 55 %
Est. RA Pressure: 3 mmHg
Fractional Shortening 2D: 41 % (ref 28–44)
IVSd: 1.3 cm — AB (ref 0.6–0.9)
LA Diameter: 4.5 cm
LA Size Index: 3.02 cm/m2
LA Volume A-L A4C: 80 mL — AB (ref 22–52)
LA Volume Index A-L A4C: 54 mL/m2 — AB (ref 16–34)
LA Volume Index MOD A4C: 50 mL/m2 — AB (ref 16–34)
LA Volume MOD A4C: 75 mL — AB (ref 22–52)
LV E' Lateral Velocity: 8.27 cm/s
LV E' Septal Velocity: 5.4 cm/s
LV Ejection Fraction A2C: 54 %
LV Ejection Fraction A4C: 23 %
LV Mass 2D Index: 128.4 g/m2 — AB (ref 43–95)
LV Mass 2D: 191.3 g — AB (ref 67–162)
LV RWT Ratio: 0.5
LVIDd Index: 2.95 cm/m2
LVIDd: 4.4 cm (ref 3.9–5.3)
LVIDs Index: 1.74 cm/m2
LVIDs: 2.6 cm
LVOT Area: 3.1 cm2
LVOT Diameter: 2 cm
LVOT Peak Gradient: 5 mmHg
LVOT Peak Velocity: 1.1 m/s
LVPWd: 1.1 cm — AB (ref 0.6–0.9)
MV A Velocity: 0.84 m/s
MV E Velocity: 1.14 m/s
MV E Wave Deceleration Time: 125.7 ms
MV E/A: 1.36
RV Free Wall Peak S': 8.5 cm/s
RVIDd: 3.7 cm
RVSP: 39 mmHg
TAPSE: 1.4 cm — AB (ref 1.7–?)
TR Max Velocity: 2.98 m/s
TR Peak Gradient: 36 mmHg

## 2024-08-12 LAB — POCT GLUCOSE
POC Glucose: 154 mg/dL — ABNORMAL HIGH (ref 65–117)
POC Glucose: 132 mg/dL — ABNORMAL HIGH (ref 65–117)
POC Glucose: 155 mg/dL — ABNORMAL HIGH (ref 65–117)

## 2024-08-12 LAB — CBC WITH AUTO DIFFERENTIAL
Basophils %: 0.6 % (ref 0.0–1.0)
Basophils Absolute: 0.05 K/UL (ref 0.00–0.10)
Eosinophils %: 6.2 % (ref 0.0–7.0)
Eosinophils Absolute: 0.54 K/UL — ABNORMAL HIGH (ref 0.00–0.40)
Hematocrit: 31 % — ABNORMAL LOW (ref 35.0–47.0)
Hemoglobin: 9.8 g/dL — ABNORMAL LOW (ref 11.5–16.0)
Immature Granulocytes %: 0.8 % — ABNORMAL HIGH (ref 0.0–0.5)
Immature Granulocytes Absolute: 0.07 K/UL — ABNORMAL HIGH (ref 0.00–0.04)
Lymphocytes %: 16.6 % (ref 12.0–49.0)
Lymphocytes Absolute: 1.44 K/UL (ref 0.80–3.50)
MCH: 30.3 pg (ref 26.0–34.0)
MCHC: 31.6 g/dL (ref 30.0–36.5)
MCV: 96 FL (ref 80.0–99.0)
MPV: 9.4 FL (ref 8.9–12.9)
Monocytes %: 13.2 % — ABNORMAL HIGH (ref 5.0–13.0)
Monocytes Absolute: 1.15 K/UL — ABNORMAL HIGH (ref 0.00–1.00)
Neutrophils %: 62.6 % (ref 32.0–75.0)
Neutrophils Absolute: 5.45 K/UL (ref 1.80–8.00)
Nucleated RBCs: 0 /100{WBCs}
Platelets: 297 K/uL (ref 150–400)
RBC: 3.23 M/uL — ABNORMAL LOW (ref 3.80–5.20)
RDW: 14.2 % (ref 11.5–14.5)
WBC: 8.7 K/uL (ref 3.6–11.0)
nRBC: 0 K/uL (ref 0.00–0.01)

## 2024-08-12 LAB — COMPREHENSIVE METABOLIC PANEL
ALT: 11 U/L (ref 10–35)
AST: 20 U/L (ref 10–35)
Albumin/Globulin Ratio: 0.9 — ABNORMAL LOW (ref 1.1–2.2)
Albumin: 2.7 g/dL — ABNORMAL LOW (ref 3.5–5.2)
Alk Phosphatase: 150 U/L — ABNORMAL HIGH (ref 35–104)
Anion Gap: 14 mmol/L (ref 2–14)
BUN/Creatinine Ratio: 4 — ABNORMAL LOW (ref 12–20)
BUN: 19 mg/dL (ref 6–20)
CO2: 26 mmol/L (ref 20–29)
Calcium: 8.1 mg/dL — ABNORMAL LOW (ref 8.6–10.0)
Chloride: 96 mmol/L — ABNORMAL LOW (ref 98–107)
Creatinine: 4.33 mg/dL — ABNORMAL HIGH (ref 0.60–1.00)
Est, Glom Filt Rate: 12 ml/min/1.73m2 — ABNORMAL LOW (ref >59)
Globulin: 2.8 g/dL (ref 2.0–4.0)
Glucose: 162 mg/dL — ABNORMAL HIGH (ref 65–100)
Potassium: 4 mmol/L (ref 3.5–5.1)
Sodium: 136 mmol/L (ref 136–145)
Total Bilirubin: 0.2 mg/dL (ref 0.0–1.2)
Total Protein: 5.5 g/dL — ABNORMAL LOW (ref 6.4–8.3)

## 2024-08-12 LAB — CULTURE, BLOOD 1

## 2024-08-12 LAB — POC PREGNANCY UR-QUAL: Preg Test, Ur: NEGATIVE

## 2024-08-12 LAB — CULTURE, BLOOD 2

## 2024-08-12 MED ORDER — CEFAZOLIN SODIUM 1 G IJ SOLR
1 | Freq: Every day | INTRAMUSCULAR | Status: DC
Start: 2024-08-12 — End: 2024-08-15
  Administered 2024-08-12 – 2024-08-14 (×2): 1000 mg via INTRAVENOUS

## 2024-08-12 MED ORDER — PROMETHAZINE HCL 6.25 MG/5ML PO SOLN
6.25 | Freq: Four times a day (QID) | ORAL | Status: DC | PRN
Start: 2024-08-12 — End: 2024-08-16
  Administered 2024-08-15 (×2): 12.5 mg via GASTROSTOMY

## 2024-08-12 MED ORDER — IOPAMIDOL 76 % IV SOLN
76 | Freq: Once | INTRAVENOUS | Status: AC | PRN
Start: 2024-08-12 — End: 2024-08-12
  Administered 2024-08-12: 19:00:00 100 mL via INTRAVENOUS

## 2024-08-12 MED ORDER — GADOTERATE MEGLUMINE 10 MMOL/20ML IV SOLN
10 | Freq: Once | INTRAVENOUS | Status: AC | PRN
Start: 2024-08-12 — End: 2024-08-12
  Administered 2024-08-12: 10 mL via INTRAVENOUS

## 2024-08-12 MED ORDER — DIAZEPAM 5 MG/ML IJ SOLN
5 | Freq: Once | INTRAMUSCULAR | Status: AC | PRN
Start: 2024-08-12 — End: 2024-08-12
  Administered 2024-08-12: 23:00:00 5 mg via INTRAVENOUS

## 2024-08-12 MED FILL — HYDRALAZINE HCL 25 MG PO TABS: 25 mg | ORAL | Qty: 2 | Fill #0

## 2024-08-12 MED FILL — MORPHINE SULFATE 4 MG/ML IJ SOLN: 4 mg/mL | INTRAMUSCULAR | Qty: 1 | Fill #0

## 2024-08-12 MED FILL — DIAZEPAM 5 MG/ML IJ SOLN: 5 mg/mL | INTRAMUSCULAR | Qty: 2 | Fill #0

## 2024-08-12 MED FILL — HEPARIN SODIUM (PORCINE) 5000 UNIT/ML IJ SOLN: 5000 [IU]/mL | INTRAMUSCULAR | Qty: 1 | Fill #0

## 2024-08-12 MED FILL — AMLODIPINE BESYLATE 5 MG PO TABS: 5 mg | ORAL | Qty: 2 | Fill #0

## 2024-08-12 MED FILL — SODIUM BICARBONATE 650 MG PO TABS: 650 mg | ORAL | Qty: 1 | Fill #0

## 2024-08-12 MED FILL — OXYCODONE HCL 5 MG PO TABS: 5 mg | ORAL | Qty: 2 | Fill #0

## 2024-08-12 MED FILL — BUSPIRONE HCL 5 MG PO TABS: 5 mg | ORAL | Qty: 1 | Fill #0

## 2024-08-12 MED FILL — SENNOSIDES-DOCUSATE SODIUM 8.6-50 MG PO TABS: 8.6-50 mg | ORAL | Qty: 1 | Fill #0

## 2024-08-12 MED FILL — FAMOTIDINE 20 MG PO TABS: 20 mg | ORAL | Qty: 1 | Fill #0

## 2024-08-12 MED FILL — SEVELAMER CARBONATE 0.8 G PO PACK: 0.8 g | ORAL | Qty: 1 | Fill #0

## 2024-08-12 MED FILL — CARVEDILOL 12.5 MG PO TABS: 12.5 mg | ORAL | Qty: 1 | Fill #0

## 2024-08-12 MED FILL — VITAMIN B-6 50 MG PO TABS: 50 mg | ORAL | Qty: 1 | Fill #0

## 2024-08-12 MED FILL — PROCHLORPERAZINE EDISYLATE 10 MG/2ML IJ SOLN: 10 MG/2ML | INTRAMUSCULAR | Qty: 2 | Fill #0

## 2024-08-12 MED FILL — GADOTERATE MEGLUMINE 10 MMOL/20ML IV SOLN: 10 MMOL/20ML | INTRAVENOUS | Qty: 20 | Fill #0

## 2024-08-12 MED FILL — DULOXETINE HCL 30 MG PO CPEP: 30 mg | ORAL | Qty: 2 | Fill #0

## 2024-08-12 MED FILL — ISOVUE-370 76 % IV SOLN: 76 % | INTRAVENOUS | Qty: 100 | Fill #0

## 2024-08-12 MED FILL — PROMETHAZINE HCL 6.25 MG/5ML PO SOLN: 6.25 MG/5ML | ORAL | Qty: 10 | Fill #0

## 2024-08-12 MED FILL — ASPIRIN 81 MG PO CHEW: 81 mg | ORAL | Qty: 1 | Fill #0

## 2024-08-12 MED FILL — CEFAZOLIN SODIUM 1 G IJ SOLR: 1 g | INTRAMUSCULAR | Qty: 1000 | Fill #0

## 2024-08-12 MED FILL — CLOPIDOGREL BISULFATE 75 MG PO TABS: 75 mg | ORAL | Qty: 1 | Fill #0

## 2024-08-12 MED FILL — PANTOPRAZOLE SODIUM 40 MG IV SOLR: 40 mg | INTRAVENOUS | Qty: 40 | Fill #0

## 2024-08-12 MED FILL — ATORVASTATIN CALCIUM 20 MG PO TABS: 20 mg | ORAL | Qty: 4 | Fill #0

## 2024-08-12 NOTE — Progress Notes (Signed)
 "    Hospitalist Progress Note      NAME:  Melinda [name]   DOB:  Nov 11, 1980  MRM:  [id number]    Date/Time: 08/12/2024  4:39 PM           Assessment / Plan:     43 y.o. female with past medical history of ESRD on HD MWF, DM2, HTN, gastroparesis requiring JG tube admitted for abdominal pain. Hospital stay complicated by sepsis with staph aureus bacteremia likely related to a UTI. Given severity of comorbidities, extensive workup to rule out other causes of systemic infection.        Sepsis/Staph aureus bacteremia:  UTI  -UA with 4+ bacteria, + blood and leukocytes, few epi cells.    -culture with alpha streptococcus  -blood cultures with staph aureus bacteremia   -repeat collected 10/31 with NGTD  -abx deescalated to ancef   - ID consulted- likely MSSA  - ECHO without vegitation  - MRI entire spine ordered- pt unable to tolerate first attempt on 10/31. Repeat to be attempted today with valium  prior   - CT of graft on left arm ordered to rule out abscess at fistula site: results pending  -WBC 15.9 => 8.7    # Diffuse abdominal pain with nausea and vomiting: Acute, present admission.  Patient has been admitted repeatedly for this in the past.  Her symptoms previously have been attributed to gastroparesis.  Patient does have a GJ tube.  CT abdomen/pelvis without acute process.  -GI following:  - N.p.o. for bowel rest  - Normal saline IV fluids as patient does make urine  -Patient has had no improvement with Reglan .  QTc is too prolonged to do erythromycin . -- continue Compazine   -- B6 25 mg every 6 hours  --Benadryl  does seem to be helping some  - continue IV Protonix   --TSH is mildly elevated this is unlikely to contribute to patient's symptoms.  Needs outpatient follow-up for this.   - pt continues to have nausea, vomiting and abdominal pain that is chronic- symptoms at her baseline.      Prolonged Qtc: Repeat EKG today shows QTc of 502.  - continue Compazine   - Repeat EKG still shows elevated QTc     Fentanyl   positive: Seen on drug screen.  May be a false positive in the setting of Benadryl  use.  Patient adamantly denies use.     # Hypertension POA. Improving  Will continue patient's home Coreg /hydralazine /amlodipine   Continue vitals per protocol     # #ESRD  On HD   Nephrology following     #GERD: Chronic.  Continue Pepcid .  IV Protonix  while NPO.     # T2DM with gastroparesis. POA, chronic.  Last A1c of 6.1 on 9/17.  Sliding scale insulin .     # S/p GJ tube- Required GJ tube placement for adequate nutrition.  Nutrition consulted, will hold tube feeds for now     Chronic pain or anxiety/depression?:  Unclear why on duloxetine  but will continue for now.  Continue BuSpar .     DAPT: Unclear why patient is on dual antiplatelet therapy.  Will continue for now.  May need to be titrated off outpatient     Anemia: Trend.  Follow-up outpatient    #      #BMI (Calculated): 24.3    I have personally reviewed the radiographs, laboratory data in Epic and decisions and statements above are based partially on this personal interpretation.  Care Plan discussed with: Patient and Consultant/Specialist    Discussed:  Care Plan    Prophylaxis:  SCD's    Disposition:  Home w/Family           ___________________________________________________    Provider: Andrez JONETTA Lee, ACNP        Subjective:     Chief Complaint:  Pt reports continued abdominal pain and nausea that is at her baseline.      ROS:  (bold if positive, if negative)    Tolerating PT  Tolerating Diet          Objective:       Vitals:          Last 24hrs VS reviewed since prior progress note. Most recent are:    Vitals:    08/12/24 1505   BP: (!) 147/78   Pulse: 81   Resp: 15   Temp: 98.1 F (36.7 C)   SpO2: 99%     SpO2 Readings from Last 6 Encounters:   08/12/24 99%   07/20/24 94%   07/11/24 98%   07/09/24 98%   07/05/24 95%   07/02/24 95%          Intake/Output Summary (Last 24 hours) at 08/12/2024 1639  Last data filed at 08/12/2024 0503  Gross per 24 hour    Intake 120 ml   Output --   Net 120 ml          Exam:     Physical Exam:    Gen:  Well-developed, well-nourished, appears uncomfortable.  HEENT:  Pink conjunctivae, hearing intact to voice  Neck:  Supple  Resp:  No accessory muscle use, clear breath sounds without wheezes rales or rhonchi  Card: 3/6 systolic murmur normal S1, S2 without thrills, bruits or peripheral edema  Abd:  Soft, mild diffuse tenderness similar to yesterday.  Nondistended  Skin/MSK: Tenderness palpation of the mid to upper spine.  No rashes or ulcers, skin turgor is good  Neuro:  Cranial nerves 3-12 are grossly intact, follows commands appropriately  Psych:  Good insight, oriented to person, place and time, alert     Telemetry reviewed:   normal sinus rhythm    Medications Reviewed: (see below)    Lab Data Reviewed: (see below)    ______________________________________________________________________    Medications:     Current Facility-Administered Medications   Medication Dose Route Frequency    promethazine  (PHENERGAN ) 6.25 MG/5ML syrup 12.5 mg  12.5 mg Per G Tube Q6H PRN    ceFAZolin  (ANCEF ) 1,000 mg in sterile water  10 mL IV syringe  1,000 mg IntraVENous Daily    diazePAM  (VALIUM ) injection 5 mg  5 mg IntraVENous Once PRN    albumin  human 25% IV solution 25 g  25 g IntraVENous PRN    epoetin  alfa-epbx (RETACRIT ) injection 10,000 Units  10,000 Units SubCUTAneous Q MWF    vitamin B-6 (PYRIDOXINE ) tablet 25 mg  25 mg Oral Q6H    prochlorperazine  (COMPAZINE ) injection 10 mg  10 mg IntraVENous Q6H PRN    0.9 % sodium chloride  infusion   IntraVENous Continuous    famotidine  (PEPCID ) tablet 20 mg  20 mg Oral Daily    diphenhydrAMINE  (BENADRYL ) injection 12.5 mg  12.5 mg IntraVENous Q8H PRN    busPIRone  (BUSPAR ) tablet 5 mg  5 mg Oral BID    diazePAM  (VALIUM ) injection 5 mg  5 mg IntraVENous Q4H PRN    sodium chloride  flush 0.9 % injection 5-40 mL  5-40 mL IntraVENous  2 times per day    sodium chloride  flush 0.9 % injection 5-40 mL  5-40 mL  IntraVENous PRN    0.9 % sodium chloride  infusion   IntraVENous PRN    heparin  (porcine) injection 5,000 Units  5,000 Units SubCUTAneous 3 times per day    polyethylene glycol (GLYCOLAX ) packet 17 g  17 g Oral Daily PRN    acetaminophen  (TYLENOL ) tablet 650 mg  650 mg Oral Q6H PRN    Or    acetaminophen  (TYLENOL ) suppository 650 mg  650 mg Rectal Q6H PRN    amLODIPine  (NORVASC ) tablet 10 mg  10 mg Oral Daily    aspirin  chewable tablet 81 mg  81 mg Oral Daily    atorvastatin  (LIPITOR ) tablet 80 mg  80 mg Oral QHS    carvedilol  (COREG ) tablet 12.5 mg  12.5 mg Oral BID WC    clopidogrel  (PLAVIX ) tablet 75 mg  75 mg Oral Daily    DULoxetine  (CYMBALTA ) extended release capsule 60 mg  60 mg Oral Daily    hydrALAZINE  (APRESOLINE ) tablet 50 mg  50 mg Oral TID    sennosides-docusate sodium  (SENOKOT-S) 8.6-50 MG tablet 1 tablet  1 tablet Oral BID    sevelamer  (RENVELA ) packet 0.8 g  0.8 g Oral TID WC    sodium bicarbonate  tablet 650 mg  650 mg Oral TID    oxyCODONE  (ROXICODONE ) immediate release tablet 5 mg  5 mg Oral Q4H PRN    Or    oxyCODONE  (ROXICODONE ) immediate release tablet 10 mg  10 mg Oral Q4H PRN    morphine  sulfate (PF) injection 2 mg  2 mg IntraVENous Q2H PRN    Or    morphine  sulfate (PF) injection 4 mg  4 mg IntraVENous Q2H PRN    insulin  lispro (HUMALOG ,ADMELOG ) injection vial 0-8 Units  0-8 Units SubCUTAneous 4x Daily AC & HS    pantoprazole  (PROTONIX ) 40 mg in sodium chloride  (PF) 0.9 % 10 mL injection  40 mg IntraVENous Daily            Lab Review:     Recent Labs     08/10/24  0206 08/11/24  0220 08/12/24  0152   WBC 14.2* 7.6 8.7   HGB 9.6* 9.6* 9.8*   HCT 32.1* 31.1* 31.0*   PLT 262 303 297     Recent Labs     08/10/24  0206 08/11/24  0220 08/12/24  0152   NA 138 135* 136   K 3.9 Hemolyzed, Recollection Recommended 4.0   CL 100 97* 96*   CO2 22 26 26    BUN 21* 11 19   ALT 22 14 11      No components found for: GLPOC      "

## 2024-08-12 NOTE — Progress Notes (Signed)
"  As is standard, if central access is needed, the internal jugular vein is the preferred site of access in this patient on hemodialysis in order to preserve arm veins.  "

## 2024-08-12 NOTE — Progress Notes (Addendum)
"  1900 Bedside shift change report given to Rosaline, CHARITY FUNDRAISER (oncoming nurse) by Waddell, RN (offgoing nurse). Report included the following information Nurse Handoff Report. Patient came back from MRI.     2328 Patient is nauseous, also complaining of pain on lower back, PRN Compazine  and Morphine  given. Patient relieved.    0700 Bedside shift change report given to Waddell, CHARITY FUNDRAISER (oncoming nurse) by Rosaline, RN (offgoing nurse). Report included the following information Nurse Handoff Report.  For Hemodialysis today  "

## 2024-08-12 NOTE — Progress Notes (Addendum)
"  VAT consulted for Midline.  Would need approval from Nephrologist to access upper arm vessels.  Nurse aware requested PIV. 20g PIV placed right lower forearm.    "

## 2024-08-12 NOTE — Plan of Care (Signed)
"    Problem: Chronic Conditions and Co-morbidities  Goal: Patient's chronic conditions and co-morbidity symptoms are monitored and maintained or improved  Outcome: Progressing     Problem: Discharge Planning  Goal: Discharge to home or other facility with appropriate resources  Outcome: Progressing     Problem: Pain  Goal: Verbalizes/displays adequate comfort level or baseline comfort level  Outcome: Progressing     Problem: Safety - Adult  Goal: Free from fall injury  Outcome: Progressing     Problem: Nutrition Deficit:  Goal: Optimize nutritional status  Outcome: Progressing     Problem: Gastrointestinal - Adult  Goal: Minimal or absence of nausea and vomiting  Outcome: Progressing  Goal: Maintains or returns to baseline bowel function  Outcome: Progressing  Goal: Maintains adequate nutritional intake  Outcome: Progressing     Problem: Skin/Tissue Integrity  Goal: Skin integrity remains intact  Description: 1.  Monitor for areas of redness and/or skin breakdown  2.  Assess vascular access sites hourly  3.  Every 4-6 hours minimum:  Change oxygen saturation probe site  4.  Every 4-6 hours:  If on nasal continuous positive airway pressure, respiratory therapy assess nares and determine need for appliance change or resting period  Outcome: Progressing     "

## 2024-08-12 NOTE — Progress Notes (Signed)
"  Ultrasound IV by  :  Procedure Note    Ultrasound IV education provided to patient. Opportunities for questions given.     Scanned patient lower right arm, attempted x1, no other suitable vessels noted.    Bedside RN Elsie notifyed     RONAL FORBES ALERT, RN   "

## 2024-08-12 NOTE — Plan of Care (Signed)
"    Problem: Chronic Conditions and Co-morbidities  Goal: Patient's chronic conditions and co-morbidity symptoms are monitored and maintained or improved  08/12/2024 2055 by Darroll Browning, RN  Outcome: Progressing  08/12/2024 1100 by Addie Birmingham, RN  Outcome: Progressing     Problem: Discharge Planning  Goal: Discharge to home or other facility with appropriate resources  08/12/2024 2055 by Darroll Browning, RN  Outcome: Progressing  08/12/2024 1100 by Addie Birmingham, RN  Outcome: Progressing     Problem: Pain  Goal: Verbalizes/displays adequate comfort level or baseline comfort level  08/12/2024 2055 by Darroll Browning, RN  Outcome: Progressing  08/12/2024 1100 by Addie Birmingham, RN  Outcome: Progressing     Problem: Safety - Adult  Goal: Free from fall injury  08/12/2024 2055 by Darroll Browning, RN  Outcome: Progressing  08/12/2024 1100 by Addie Birmingham, RN  Outcome: Progressing     Problem: Skin/Tissue Integrity  Goal: Skin integrity remains intact  Description: 1.  Monitor for areas of redness and/or skin breakdown  2.  Assess vascular access sites hourly  3.  Every 4-6 hours minimum:  Change oxygen saturation probe site  4.  Every 4-6 hours:  If on nasal continuous positive airway pressure, respiratory therapy assess nares and determine need for appliance change or resting period  08/12/2024 2055 by Darroll Browning, RN  Outcome: Progressing  08/12/2024 1100 by Addie Birmingham, RN  Outcome: Progressing     "

## 2024-08-12 NOTE — Progress Notes (Signed)
"  Patient lost IV access, Utrasound access failed, called on-cal nephro for permission for some type of central line.  "

## 2024-08-13 ENCOUNTER — Inpatient Hospital Stay: Admit: 2024-08-13 | Payer: PRIVATE HEALTH INSURANCE

## 2024-08-13 LAB — CBC WITH AUTO DIFFERENTIAL
Basophils %: 0.8 % (ref 0.0–1.0)
Basophils Absolute: 0.05 K/UL (ref 0.00–0.10)
Eosinophils %: 10.3 % — ABNORMAL HIGH (ref 0.0–7.0)
Eosinophils Absolute: 0.64 K/UL — ABNORMAL HIGH (ref 0.00–0.40)
Hematocrit: 28.7 % — ABNORMAL LOW (ref 35.0–47.0)
Hemoglobin: 9.2 g/dL — ABNORMAL LOW (ref 11.5–16.0)
Immature Granulocytes %: 1.1 % — ABNORMAL HIGH (ref 0.0–0.5)
Immature Granulocytes Absolute: 0.07 K/UL — ABNORMAL HIGH (ref 0.00–0.04)
Lymphocytes %: 20.7 % (ref 12.0–49.0)
Lymphocytes Absolute: 1.29 K/UL (ref 0.80–3.50)
MCH: 30.4 pg (ref 26.0–34.0)
MCHC: 32.1 g/dL (ref 30.0–36.5)
MCV: 94.7 FL (ref 80.0–99.0)
MPV: 9.2 FL (ref 8.9–12.9)
Monocytes %: 13.6 % — ABNORMAL HIGH (ref 5.0–13.0)
Monocytes Absolute: 0.85 K/UL (ref 0.00–1.00)
Neutrophils %: 53.5 % (ref 32.0–75.0)
Neutrophils Absolute: 3.33 K/UL (ref 1.80–8.00)
Nucleated RBCs: 0 /100{WBCs}
Platelets: 297 K/uL (ref 150–400)
RBC: 3.03 M/uL — ABNORMAL LOW (ref 3.80–5.20)
RDW: 14.3 % (ref 11.5–14.5)
WBC: 6.2 K/uL (ref 3.6–11.0)
nRBC: 0 K/uL (ref 0.00–0.01)

## 2024-08-13 LAB — COMPREHENSIVE METABOLIC PANEL
ALT: <5 U/L — ABNORMAL LOW (ref 10–35)
AST: 19 U/L (ref 10–35)
Albumin/Globulin Ratio: 0.7 — ABNORMAL LOW (ref 1.1–2.2)
Albumin: 2.5 g/dL — ABNORMAL LOW (ref 3.5–5.2)
Alk Phosphatase: 131 U/L — ABNORMAL HIGH (ref 35–104)
Anion Gap: 12 mmol/L (ref 2–14)
BUN/Creatinine Ratio: 4 — ABNORMAL LOW (ref 12–20)
BUN: 24 mg/dL — ABNORMAL HIGH (ref 6–20)
CO2: 24 mmol/L (ref 20–29)
Calcium: 8 mg/dL — ABNORMAL LOW (ref 8.6–10.0)
Chloride: 98 mmol/L (ref 98–107)
Creatinine: 5.41 mg/dL — ABNORMAL HIGH (ref 0.60–1.00)
Est, Glom Filt Rate: 10 ml/min/1.73m2 — ABNORMAL LOW (ref >59)
Globulin: 3.3 g/dL (ref 2.0–4.0)
Glucose: 113 mg/dL — ABNORMAL HIGH (ref 65–100)
Potassium: 4 mmol/L (ref 3.5–5.1)
Sodium: 134 mmol/L — ABNORMAL LOW (ref 136–145)
Total Bilirubin: 0.2 mg/dL (ref 0.0–1.2)
Total Protein: 5.8 g/dL — ABNORMAL LOW (ref 6.4–8.3)

## 2024-08-13 LAB — POCT GLUCOSE
POC Glucose: 155 mg/dL — ABNORMAL HIGH (ref 65–117)
POC Glucose: 122 mg/dL — ABNORMAL HIGH (ref 65–117)
POC Glucose: 136 mg/dL — ABNORMAL HIGH (ref 65–117)
POC Glucose: 134 mg/dL — ABNORMAL HIGH (ref 65–117)

## 2024-08-13 MED ORDER — METOPROLOL TARTRATE 5 MG/5ML IV SOLN
5 | Freq: Four times a day (QID) | INTRAVENOUS | Status: DC | PRN
Start: 2024-08-13 — End: 2024-08-13

## 2024-08-13 MED FILL — PROCHLORPERAZINE EDISYLATE 10 MG/2ML IJ SOLN: 10 MG/2ML | INTRAMUSCULAR | Qty: 2 | Fill #0

## 2024-08-13 MED FILL — DULOXETINE HCL 30 MG PO CPEP: 30 mg | ORAL | Qty: 2 | Fill #0

## 2024-08-13 MED FILL — MORPHINE SULFATE 4 MG/ML IJ SOLN: 4 mg/mL | INTRAMUSCULAR | Qty: 1 | Fill #0

## 2024-08-13 MED FILL — RETACRIT 10000 UNIT/ML IJ SOLN: 10000 [IU]/mL | INTRAMUSCULAR | Qty: 1 | Fill #0

## 2024-08-13 MED FILL — CARVEDILOL 12.5 MG PO TABS: 12.5 mg | ORAL | Qty: 1 | Fill #0

## 2024-08-13 MED FILL — SODIUM BICARBONATE 650 MG PO TABS: 650 mg | ORAL | Qty: 1 | Fill #0

## 2024-08-13 MED FILL — SEVELAMER CARBONATE 0.8 G PO PACK: 0.8 g | ORAL | Qty: 1 | Fill #0

## 2024-08-13 MED FILL — CEFAZOLIN SODIUM 1 G IJ SOLR: 1 g | INTRAMUSCULAR | Qty: 1000 | Fill #0

## 2024-08-13 MED FILL — VITAMIN B-6 50 MG PO TABS: 50 mg | ORAL | Qty: 1 | Fill #0

## 2024-08-13 MED FILL — HEPARIN SODIUM (PORCINE) 5000 UNIT/ML IJ SOLN: 5000 [IU]/mL | INTRAMUSCULAR | Qty: 1 | Fill #0

## 2024-08-13 MED FILL — PANTOPRAZOLE SODIUM 40 MG IV SOLR: 40 mg | INTRAVENOUS | Qty: 40 | Fill #0

## 2024-08-13 MED FILL — SENNOSIDES-DOCUSATE SODIUM 8.6-50 MG PO TABS: 8.6-50 mg | ORAL | Qty: 1 | Fill #0

## 2024-08-13 MED FILL — FAMOTIDINE 20 MG PO TABS: 20 mg | ORAL | Qty: 1 | Fill #0

## 2024-08-13 MED FILL — BUSPIRONE HCL 5 MG PO TABS: 5 mg | ORAL | Qty: 1 | Fill #0

## 2024-08-13 MED FILL — PROMETHAZINE HCL 6.25 MG/5ML PO SOLN: 6.25 MG/5ML | ORAL | Qty: 10 | Fill #0

## 2024-08-13 MED FILL — ATORVASTATIN CALCIUM 20 MG PO TABS: 20 mg | ORAL | Qty: 4 | Fill #0

## 2024-08-13 MED FILL — HYDRALAZINE HCL 25 MG PO TABS: 25 mg | ORAL | Qty: 2 | Fill #0

## 2024-08-13 MED FILL — OXYCODONE HCL 10 MG PO TABS: 10 mg | ORAL | Qty: 1 | Fill #0

## 2024-08-13 MED FILL — ASPIRIN 81 MG PO CHEW: 81 mg | ORAL | Qty: 1 | Fill #0

## 2024-08-13 MED FILL — CLOPIDOGREL BISULFATE 75 MG PO TABS: 75 mg | ORAL | Qty: 1 | Fill #0

## 2024-08-13 NOTE — Progress Notes (Signed)
 "Infectious Disease Consult    Impression/Plan   MSSA  bacteremia.  Concern raised for AV graft abscess.  Narrow antibiotics to cefazolin .  Repeat blood cultures are sterile to date.  Awaiting vascular recommendations.  Patient would need TEE based on VIRSTA criteria but if there is an abscess involving the fistula patient will need 6 weeks of antibiotics anyway so the TEE is not necessary to determine antibiotic course  Possible AV graft abscess.  Await vascular surgery input.  Alpha Streptococcus isolated from urine.  Likely colonization.  This will be covered by antibiotics above  Leukocytosis.  Resolved..  Back pain.  Chronic.  No evidence of discitis on MRI of cervical, thoracic, or lumbar spine  ESRD-HD MWF via left AV fistula  Diabetes mellitus.  Hemoglobin A1c 6.1  Severe gastroparesis.  Patient has PEG/J-tube.  GI following    Anti-infectives:   Cefazolin    Subjective:   Date of Consultation:  August 13, 2024  Date of Admission: 08/06/2024   Referring Physician:     Patient is a 43 y.o. female with a past medical history significant for end-stage renal disease on dialysis, hypertension, and gastroparesis who is being seen for bacteremia.    Patient was in her usual state of health until the day prior to admission when she developed severe diffuse abdominal pain associated with nausea.  The patient missed dialysis session due to vomiting.    Patient was admitted to [facility] for evaluation of nausea and vomiting.  Patient has a PEG/J-tube tube which she uses for medications.     Blood cultures drawn at the time of admission have returned growing Staphylococcus aureus.  This appears to be MSSA based on PCR testing.  She is currently on vancomycin  and ceftriaxone .  The infectious diseases service has been asked to assist with antibiotic management    08/13/2024.  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)    Intractable abdominal pain    Intractable vomiting    Gastroparesis    ESRD (end stage renal disease) on dialysis (HCC)    Acute hyperkalemia    ESRD (end stage renal disease) (HCC)    Type 2 diabetes mellitus with chronic kidney disease on chronic dialysis, with long-term current use of insulin  (HCC)    Hypertensive emergency    MSSA bacteremia    End-stage renal disease on hemodialysis (HCC)     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   Social History     Tobacco Use    Smoking status: Never    Smokeless tobacco: Never   Substance Use Topics    Alcohol use: Never     Past Surgical History:   Procedure Laterality Date  CAPSULE ENDOSCOPY N/A 01/20/2023    ESOPHAGEAL CAPSULE ENDOSCOPY remove at 1624PM performed by Madlyn Fendt, MD at [facility] ENDOSCOPY    CHOLECYSTECTOMY, LAPAROSCOPIC N/A 02/03/2023    ROBOTIC LAPAROSCOPIC CHOLECYSTECTOMY with Indocyanine green  performed by Chrystal Elijah BRAVO, MD at [facility] MAIN OR    COLONOSCOPY N/A 01/19/2023    COLONOSCOPY DIAGNOSTIC performed by Madlyn Fendt, MD at [facility] ENDOSCOPY    INVASIVE VASCULAR N/A 10/03/2023    Angiography visceral SMA performed by Millard Oneil LABOR, MD at [facility] CARDIAC CATH LAB    INVASIVE VASCULAR N/A 10/03/2023    Ultrasound guided vascular access performed by Millard Oneil LABOR, MD at [facility] CARDIAC CATH LAB    INVASIVE VASCULAR N/A 10/03/2023    Insert stent peripheral artery performed by Millard Oneil LABOR, MD at [facility] CARDIAC CATH LAB    IR NONTUNNELED VASCULAR CATHETER > 5 YEARS  10/10/2023    IR NONTUNNELED  VASCULAR CATHETER > 5 YEARS 10/10/2023 [facility] CARDIAC CATH/EP/IR LAB    IR NONTUNNELED VASCULAR CATHETER > 5 YEARS  10/10/2023    IR NONTUNNELED VASCULAR CATHETER > 5 YEARS 10/10/2023 [facility] CARDIAC CATH/EP/IR LAB    OTHER SURGICAL HISTORY Left     Rentia attachment    TUBAL LIGATION Bilateral     UPPER GASTROINTESTINAL ENDOSCOPY N/A 01/17/2023    ESOPHAGOGASTRODUODENOSCOPY performed by Madlyn Fendt, MD at [facility] ENDOSCOPY    UPPER GASTROINTESTINAL ENDOSCOPY N/A 01/17/2023    ESOPHAGOGASTRODUODENOSCOPY BIOPSY performed by Madlyn Fendt, MD at [facility] ENDOSCOPY    UPPER GASTROINTESTINAL ENDOSCOPY N/A 01/18/2023    ESOPHAGOGASTRODUODENOSCOPY performed by Madlyn Fendt, MD at [facility] ENDOSCOPY    UPPER GASTROINTESTINAL ENDOSCOPY N/A 05/13/2023    ESOPHAGOGASTRODUODENOSCOPY performed by Roseann Lonni BIRCH, MD at [facility] ENDOSCOPY    UPPER GASTROINTESTINAL ENDOSCOPY N/A 05/13/2023    ESOPHAGOGASTRODUODENOSCOPY BIOPSY performed by Roseann Lonni BIRCH, MD at [facility] ENDOSCOPY    UPPER GASTROINTESTINAL ENDOSCOPY N/A 09/21/2023    ESOPHAGOGASTRODUODENOSCOPY performed by Madlyn Fendt, MD at [facility] ENDOSCOPY    UPPER GASTROINTESTINAL ENDOSCOPY N/A 09/21/2023    ESOPHAGOGASTRODUODENOSCOPY BIOPSY performed by Madlyn Fendt, MD at [facility] ENDOSCOPY    US  FLUID COLLECTION DRAINAGE PERITONEAL/RETROPERITONEAL [facility]  02/11/2023    US  ABSCESS DRAINAGE PERITONEAL 02/11/2023 [facility] RAD US       Prior to Admission medications   Medication Sig Start Date End Date Taking? Authorizing Provider   metoclopramide  (REGLAN ) 5 MG tablet Take 1 tablet by mouth 3 times daily as needed (nausea/vomiting) 07/20/24 07/25/24  Cutchins, Abigail M, PA-C   lidocaine  4 % external patch Place 1 patch onto the skin daily 07/02/24   Karenann Barter, MD   sevelamer  (RENVELA ) 0.8 g PACK packet Take 1 packet by mouth 3 times daily (with meals) 07/02/24   Karenann Barter, MD   atorvastatin  (LIPITOR ) 80 MG tablet Take 1 tablet by mouth nightly    [provider]   carvedilol  (COREG ) 12.5 MG tablet Take 1  tablet by mouth 2 times daily (with meals)    [provider]   famotidine  (PEPCID ) 20 MG tablet Take 1 tablet by mouth 2 times daily    [provider]   hydrALAZINE  (APRESOLINE ) 50 MG tablet Take 1 tablet by mouth 3 times daily    [provider]   sodium bicarbonate  650 MG tablet Take 1 tablet by mouth 3 times daily 02/23/24   Sueellen Manuelita Bohr, MD   pantoprazole  (PROTONIX ) 40 MG tablet Take 1 tablet by mouth 2 times daily (before meals) 02/23/24   Muncy, Lindsay  Celestia, MD   sennosides-docusate sodium  (SENOKOT-S) 8.6-50 MG tablet Take 1 tablet by mouth in the morning and at bedtime 11/06/23   Do, Khoi B, MD   ondansetron  (ZOFRAN -ODT) 4 MG disintegrating tablet Take 1 tablet by mouth every 8 hours as needed for Nausea or Vomiting 11/06/23   Do, Rodrick NOVAK, MD   prochlorperazine  (COMPAZINE ) 10 MG tablet Take 1 tablet by mouth every 6 hours as needed (nausea or vomiting) 10/25/23   Orson Planas, MD   metFORMIN  (GLUCOPHAGE -XR) 500 MG extended release tablet Take 2 tablets by mouth daily (with breakfast)  Patient not taking: Reported on 06/26/2024 10/25/23   Aktig, Ziya, MD   busPIRone  (BUSPAR ) 15 MG tablet Take 15 mg by mouth 3 times daily  Patient taking differently: Take 5 mg by mouth in the morning and at bedtime 10/14/23   Tefera, Mesfin A, MD   DULoxetine  (CYMBALTA ) 60 MG extended release capsule Take 1 capsule by mouth daily 10/15/23   Tefera, Mesfin A, MD   clopidogrel  (PLAVIX ) 75 MG tablet Take 1 tablet by mouth daily 10/15/23   Tefera, Mesfin A, MD   aspirin  81 MG chewable tablet Take 1 tablet by mouth daily 10/15/23   Tefera, Mesfin A, MD   amLODIPine  (NORVASC ) 10 MG tablet Take 1 tablet by mouth daily 03/23/23   Do, Khoi B, MD     No Known Allergies     Review of Systems:  Pertinent items are noted in the History of Present Illness.    Objective:   Blood pressure (!) 169/77, pulse 88, temperature 98.2 F (36.8 C), temperature source Oral, resp. rate 16, height 1.499 m (4' 11.02), weight 54.6  kg (120 lb 5.9 oz), SpO2 97%.  Temp (24hrs), Avg:98.5 F (36.9 C), Min:98.1 F (36.7 C), Max:99.3 F (37.4 C)       Exam:    General:  Moderate distress due to nausea   Eyes:  Sclera anicteric.   Mouth/Throat: Mucous membranes normal   Neck: Supple   Lungs:   No distress   CV:     Abdomen:   G-tube in place   Extremities: Moves all.   Skin: No acute rash on exposed skin   Lymph nodes:    Musculoskeletal:    Lines/Devices:     Psych:        Data Review:   Recent Results (from the past 24 hours)   POCT Glucose    Collection Time: 08/12/24 11:38 AM   Result Value Ref Range    POC Glucose 132 (H) 65 - 117 mg/dL    Performed by: JAROLD CRANE    POCT Glucose    Collection Time: 08/12/24  4:20 PM   Result Value Ref Range    POC Glucose 155 (H) 65 - 117 mg/dL    Performed by: JAROLD CRANE    POCT Glucose    Collection Time: 08/12/24  7:54 PM   Result Value Ref Range    POC Glucose 155 (H) 65 - 117 mg/dL    Performed by: Gerrit Onyinyechukwu (TRV RN)    CBC with Auto Differential    Collection Time: 08/13/24  3:27 AM   Result Value Ref Range    WBC 6.2 3.6 - 11.0 K/uL    RBC 3.03 (L) 3.80 - 5.20 M/uL    Hemoglobin 9.2 (L) 11.5 - 16.0 g/dL    Hematocrit 71.2 (L) 35.0 - 47.0 %    MCV 94.7 80.0 - 99.0 FL    MCH  30.4 26.0 - 34.0 PG    MCHC 32.1 30.0 - 36.5 g/dL    RDW 85.6 88.4 - 85.4 %    Platelets 297 150 - 400 K/uL    MPV 9.2 8.9 - 12.9 FL    Nucleated RBCs 0.0 0 PER 100 WBC    nRBC 0.00 0.00 - 0.01 K/uL    Neutrophils % 53.5 32.0 - 75.0 %    Lymphocytes % 20.7 12.0 - 49.0 %    Monocytes % 13.6 (H) 5.0 - 13.0 %    Eosinophils % 10.3 (H) 0.0 - 7.0 %    Basophils % 0.8 0.0 - 1.0 %    Immature Granulocytes % 1.1 (H) 0.0 - 0.5 %    Neutrophils Absolute 3.33 1.80 - 8.00 K/UL    Lymphocytes Absolute 1.29 0.80 - 3.50 K/UL    Monocytes Absolute 0.85 0.00 - 1.00 K/UL    Eosinophils Absolute 0.64 (H) 0.00 - 0.40 K/UL    Basophils Absolute 0.05 0.00 - 0.10 K/UL    Immature Granulocytes Absolute 0.07 (H) 0.00 - 0.04 K/UL     Differential Type AUTOMATED     Comprehensive Metabolic Panel    Collection Time: 08/13/24  3:27 AM   Result Value Ref Range    Sodium 134 (L) 136 - 145 mmol/L    Potassium 4.0 3.5 - 5.1 mmol/L    Chloride 98 98 - 107 mmol/L    CO2 24 20 - 29 mmol/L    Anion Gap 12 2 - 14 mmol/L    Glucose 113 (H) 65 - 100 mg/dL    BUN 24 (H) 6 - 20 MG/DL    Creatinine 4.58 (H) 0.60 - 1.00 MG/DL    BUN/Creatinine Ratio 4 (L) 12 - 20      Est, Glom Filt Rate 10 (L) >59 ml/min/1.60m2    Calcium  8.0 (L) 8.6 - 10.0 MG/DL    Total Bilirubin 0.2 0.0 - 1.2 MG/DL    ALT <5 (L) 10 - 35 U/L    AST 19 10 - 35 U/L    Alk Phosphatase 131 (H) 35 - 104 U/L    Total Protein 5.8 (L) 6.4 - 8.3 g/dL    Albumin  2.5 (L) 3.5 - 5.2 g/dL    Globulin 3.3 2.0 - 4.0 g/dL    Albumin /Globulin Ratio 0.7 (L) 1.1 - 2.2     POCT Glucose    Collection Time: 08/13/24  8:18 AM   Result Value Ref Range    POC Glucose 122 (H) 65 - 117 mg/dL    Performed by: Addie Birmingham         Microbiology:      Studies:      A total time of 35 minutes was spent on today's encounter.  Greater than 50% of the time was spent on the following:  Preparing for visit and chart review.  Obtaining and/or reviewing separately obtained history  Performing a medically appropriate exam and/or evaluation  Counseling and educating a patient/family/caregiver as noted above  Placing relevant orders  Referring and communicating with other professionals (not separately reported)  Independently interpreting results (not separately reported) and communicating results to the patient/family/caregiver  Care coordination (not separately reported) as noted above  Documenting clinical information in the electronic health records (e.g. problem list, visit note) on the day of the encounter     Signed By: Oneil Spencer, DO     August 13, 2024         "

## 2024-08-13 NOTE — Progress Notes (Signed)
 "                            [place] [facility]  Date of Birth: 1981/06/24          Assessment & Plan:     ESRD on HD MWF via LAVG, [facility], f/b Dr. Kalvin  Hypertensive urgency-better after HD  DM  AOCD  Chronic abd pain, n/v from gastroparesis-reason for admit  Staph bacteremia   ?abscess of AVG     Plan:  Awaiting vascular eval. HD with AVG vs temp line  HD MWF  ESA MWF       Subjective:   CC: ESRD  HPI: For HD today. There was an abnormality seen anterior to her AVG on CT and vascular is to evaluate. Concern for abscess in setting of bacteremia.    Current Facility-Administered Medications   Medication Dose Route Frequency    promethazine  (PHENERGAN ) 6.25 MG/5ML syrup 12.5 mg  12.5 mg Per G Tube Q6H PRN    ceFAZolin  (ANCEF ) 1,000 mg in sterile water  10 mL IV syringe  1,000 mg IntraVENous Daily    albumin  human 25% IV solution 25 g  25 g IntraVENous PRN    epoetin  alfa-epbx (RETACRIT ) injection 10,000 Units  10,000 Units SubCUTAneous Q MWF    vitamin B-6 (PYRIDOXINE ) tablet 25 mg  25 mg Oral Q6H    prochlorperazine  (COMPAZINE ) injection 10 mg  10 mg IntraVENous Q6H PRN    0.9 % sodium chloride  infusion   IntraVENous Continuous    famotidine  (PEPCID ) tablet 20 mg  20 mg Oral Daily    diphenhydrAMINE  (BENADRYL ) injection 12.5 mg  12.5 mg IntraVENous Q8H PRN    busPIRone  (BUSPAR ) tablet 5 mg  5 mg Oral BID    diazePAM  (VALIUM ) injection 5 mg  5 mg IntraVENous Q4H PRN    sodium chloride  flush 0.9 % injection 5-40 mL  5-40 mL IntraVENous 2 times per day    sodium chloride  flush 0.9 % injection 5-40 mL  5-40 mL IntraVENous PRN    0.9 % sodium chloride  infusion   IntraVENous PRN    heparin  (porcine) injection 5,000 Units  5,000 Units SubCUTAneous 3 times per day    polyethylene glycol (GLYCOLAX ) packet 17 g  17 g Oral Daily PRN    acetaminophen  (TYLENOL ) tablet 650 mg  650 mg Oral Q6H PRN    Or    acetaminophen  (TYLENOL ) suppository 650 mg  650 mg Rectal Q6H PRN     amLODIPine  (NORVASC ) tablet 10 mg  10 mg Oral Daily    aspirin  chewable tablet 81 mg  81 mg Oral Daily    atorvastatin  (LIPITOR ) tablet 80 mg  80 mg Oral QHS    carvedilol  (COREG ) tablet 12.5 mg  12.5 mg Oral BID WC    clopidogrel  (PLAVIX ) tablet 75 mg  75 mg Oral Daily    DULoxetine  (CYMBALTA ) extended release capsule 60 mg  60 mg Oral Daily    hydrALAZINE  (APRESOLINE ) tablet 50 mg  50 mg Oral TID    sennosides-docusate sodium  (SENOKOT-S) 8.6-50 MG tablet 1 tablet  1 tablet Oral BID    sevelamer  (RENVELA ) packet 0.8 g  0.8 g Oral TID WC    sodium bicarbonate  tablet 650 mg  650 mg Oral TID    oxyCODONE  (ROXICODONE ) immediate release tablet 5 mg  5 mg Oral Q4H PRN    Or  oxyCODONE  (ROXICODONE ) immediate release tablet 10 mg  10 mg Oral Q4H PRN    morphine  sulfate (PF) injection 2 mg  2 mg IntraVENous Q2H PRN    Or    morphine  sulfate (PF) injection 4 mg  4 mg IntraVENous Q2H PRN    insulin  lispro (HUMALOG ,ADMELOG ) injection vial 0-8 Units  0-8 Units SubCUTAneous 4x Daily AC & HS    pantoprazole  (PROTONIX ) 40 mg in sodium chloride  (PF) 0.9 % 10 mL injection  40 mg IntraVENous Daily          Objective:     Vitals:  Blood pressure (!) 157/67, pulse 81, temperature 97.7 F (36.5 C), temperature source Oral, resp. rate 18, height 1.499 m (4' 11.02), weight 54.6 kg (120 lb 5.9 oz), SpO2 97%.  Temp (24hrs), Avg:98.3 F (36.8 C), Min:97.7 F (36.5 C), Max:99.3 F (37.4 C)      Intake and Output:  No intake/output data recorded.  11/01 1901 - 11/03 0700  In: 1036 [P.O.:270; I.V.:766]  Out: -     Physical Exam:               GENERAL ASSESSMENT: NAD  HEENT: Nontraumatic   CHEST: CTA  HEART: S1S2  ABDOMEN: Soft,NT  EXTREMITY: no EDEMA; LUE AVG NT, no swelling/warmth, good t/b  NEURO: Grossly non focal          ECG/rhythm:    Data Review        Recent Labs     08/11/24  0220 08/12/24  0152 08/13/24  0327   NA 135* 136 134*   K Hemolyzed, Recollection Recommended 4.0 4.0   CL 97* 96* 98   CO2 26 26 24    BUN 11 19 24*    CREATININE 3.10* 4.33* 5.41*   GLUCOSE 129* 162* 113*   CALCIUM  8.2* 8.1* 8.0*       : Brena Aran, MD  08/13/2024        [facility]:  [email or web address]  [email or web address]    [place] office:  [address] [place], [address]  [place], [place] [address]  Phone: [phone]  Fax :     [phone]    [facility] office:  [address]  [place], [place]  [address]  Phone - [phone]  Fax - [phone]                                                                                        "

## 2024-08-13 NOTE — Plan of Care (Signed)
"    Problem: Chronic Conditions and Co-morbidities  Goal: Patient's chronic conditions and co-morbidity symptoms are monitored and maintained or improved  08/13/2024 1042 by Addie Birmingham, RN  Outcome: Progressing  08/12/2024 2055 by Darroll Browning, RN  Outcome: Progressing     Problem: Discharge Planning  Goal: Discharge to home or other facility with appropriate resources  08/13/2024 1042 by Addie Birmingham, RN  Outcome: Progressing  Flowsheets (Taken 08/12/2024 2155 by Darroll Browning, RN)  Discharge to home or other facility with appropriate resources: Identify barriers to discharge with patient and caregiver  08/12/2024 2055 by Darroll Browning, RN  Outcome: Progressing     Problem: Pain  Goal: Verbalizes/displays adequate comfort level or baseline comfort level  08/13/2024 1042 by Addie Birmingham, RN  Outcome: Progressing  08/12/2024 2055 by Darroll Browning, RN  Outcome: Progressing     Problem: Safety - Adult  Goal: Free from fall injury  08/13/2024 1042 by Addie Birmingham, RN  Outcome: Progressing  08/12/2024 2055 by Darroll Browning, RN  Outcome: Progressing     Problem: Nutrition Deficit:  Goal: Optimize nutritional status  Outcome: Progressing     Problem: Gastrointestinal - Adult  Goal: Minimal or absence of nausea and vomiting  Outcome: Progressing  Goal: Maintains or returns to baseline bowel function  Outcome: Progressing  Goal: Maintains adequate nutritional intake  Outcome: Progressing     Problem: Skin/Tissue Integrity  Goal: Skin integrity remains intact  Description: 1.  Monitor for areas of redness and/or skin breakdown  2.  Assess vascular access sites hourly  3.  Every 4-6 hours minimum:  Change oxygen saturation probe site  4.  Every 4-6 hours:  If on nasal continuous positive airway pressure, respiratory therapy assess nares and determine need for appliance change or resting period  08/13/2024 1042 by Addie Birmingham, RN  Outcome: Progressing  Flowsheets (Taken 08/12/2024 2155 by Darroll Browning, RN)  Skin  Integrity Remains Intact: Monitor for areas of redness and/or skin breakdown  08/12/2024 2055 by Darroll Browning, RN  Outcome: Progressing     "

## 2024-08-13 NOTE — Consults (Signed)
 Session ID: [id number]  Session Duration: 6 minutes  Language: Spanish  Interpreter ID: #[id number]  Interpreter Name: Charolet

## 2024-08-13 NOTE — Progress Notes (Signed)
"  Just now made aware of patient.  Neither the CT scan nor the duplex look sinister to me.    It is certainly not an abscess and it looks like a chronic hematoma to me  I would not favor graft excision unless something more compelling.   If blood cultures fail to clear could consider exploration  I think it is ok to use the graft.   "

## 2024-08-13 NOTE — Dialysis (Signed)
"  Hep B verification performed for (RN): Oncoming Rn  Hep B results, dates, and Primary Source:   Negative/Susceptible  08/07/2024  Epic  Hepatitis B Surface Ag   Date/Time Value Ref Range Status   08/07/2024 01:38 AM NONREACTIVE NR   Final     Hep B S Ag Interp   Date/Time Value Ref Range Status   03/16/2023 08:19 AM Negative NEG   Final     Hep B S Ab   Date/Time Value Ref Range Status   08/07/2024 01:38 AM <3.50 mIU/mL Final     Comment:        <10.00 mIU/mL     Non-Immune  =>10.00 mIU/mL    Individual is considered to be immune to infection with HBV       Machine disinfect process:Acid/Heat  "

## 2024-08-13 NOTE — Care Coordination (Signed)
"  Care Management Progress Note    Reason for Admission:   Hypertensive emergency [I16.1]  Intractable nausea and vomiting [R11.2]         Patient Admission Status: Inpatient  RUR:   Hospitalization in the last 30 days (Readmission):  No        Transition of care plan:  Medical - ID following, GI to see patient  DC plan - home with family when medically stable to discharge with continues OP dialysis services as prior to admission  Discharge plan communicated with patient and/or discharge caregiver: Yes    Date 1st IMM letter given:   Outpatient follow-up - per medical team  Transport at discharge:  Family  "

## 2024-08-13 NOTE — Progress Notes (Signed)
 "    Hospitalist Progress Note      NAME:  Melinda [name]   DOB:  11/19/1980  MRM:  [id number]    Date/Time: 08/13/2024  7:49 AM           Assessment / Plan:     43 y.o. female with past medical history of ESRD on HD MWF, DM2, HTN, gastroparesis requiring JG tube admitted for abdominal pain. Hospital stay complicated by sepsis with staph aureus bacteremia likely related to a UTI. Given severity of comorbidities, extensive workup to rule out other causes of systemic infection.        Sepsis/Staph aureus bacteremia:  -UA with 4+ bacteria, + blood and leukocytes, few epi cells.    -culture with alpha streptococcus- likely colonization- covered by ancef   -blood cultures with staph aureus bacteremia   -repeat collected 10/31 with NGTD  -abx deescalated to ancef   - ID consulted- likely MSSA  - ECHO without vegitation  - MRI entire spine with degenerative changes  - CT of graft on left arm with Nonspecific 2 cm area of subcutaneous attenuation anterior to the  distal graft anastomosis. A defined collection is not shown.  - US  obtained with ill-defined, heterogeneously hypoechoic mass anterior to the AV fistula graft. Possible phlegmon vs old hematoma.   -potentially will need 6 wks abx  -Vascular surgery consulted: cleared to obtain dialysis today.   -WBC 15.9 => 6.2    # Diffuse abdominal pain with nausea and vomiting: Acute, present admission.  Patient has been admitted repeatedly for this in the past.  Her symptoms previously have been attributed to gastroparesis.  Patient does have a GJ tube.  CT abdomen/pelvis without acute process.  -GI following:  -  resuming tube feedings today at 30/hr  - Normal saline IV fluids as patient does make urine  - continue Compazine   -- B6 25 mg every 6 hours  --Benadryl  does seem to be helping some  - continue IV Protonix    - pt continues to have nausea, vomiting and abdominal pain that is chronic- symptoms at her baseline.      Prolonged Qtc: Repeat EKG today shows QTc of 502.  -  continue Compazine   -avoid zofran      Fentanyl  positive: Seen on drug screen.  May be a false positive in the setting of Benadryl  use.  Patient adamantly denies use.     # Hypertension POA. Improved  Will continue patient's home Coreg /hydralazine /amlodipine   Lopressor  PRN for SBP >180     # #ESRD  On HD   Nephrology following     #GERD: Chronic.  Continue Pepcid .  IV Protonix  while NPO.     # T2DM with gastroparesis. POA, chronic.  Last A1c of 6.1 on 9/17.  Sliding scale insulin .     # S/p GJ tube- Required GJ tube placement for adequate nutrition.  Nutrition consulted, will hold tube feeds for now     Chronic pain or anxiety/depression?:  Unclear why on duloxetine  but will continue for now.  Continue BuSpar .     DAPT: Unclear why patient is on dual antiplatelet therapy.  Will continue for now.  May need to be titrated off outpatient     Anemia: Trend.  Follow-up outpatient    #      #BMI (Calculated): 24.3    I have personally reviewed the radiographs, laboratory data in Epic and decisions and statements above are based partially on this personal interpretation.  Care Plan discussed with: Patient and Consultant/Specialist    Discussed:  Care Plan    Prophylaxis:  SCD's    Disposition:  Home w/Family           ___________________________________________________    Provider: Andrez JONETTA Lee, ACNP        Subjective:     Chief Complaint:  Pt reports continued abdominal pain and nausea that is at her baseline.      ROS:  (bold if positive, if negative)    Tolerating PT  Tolerating Diet          Objective:       Vitals:          Last 24hrs VS reviewed since prior progress note. Most recent are:    Vitals:    08/13/24 0349   BP: (!) 147/74   Pulse: 76   Resp: 17   Temp: 99.3 F (37.4 C)   SpO2: 91%     SpO2 Readings from Last 6 Encounters:   08/13/24 91%   07/20/24 94%   07/11/24 98%   07/09/24 98%   07/05/24 95%   07/02/24 95%          Intake/Output Summary (Last 24 hours) at 08/13/2024 0749  Last data filed  at 08/13/2024 0539  Gross per 24 hour   Intake 915.98 ml   Output --   Net 915.98 ml          Exam:     Physical Exam:    Gen:  Well-developed, well-nourished, appears uncomfortable.  HEENT:  Pink conjunctivae, hearing intact to voice  Neck:  Supple  Resp:  No accessory muscle use, clear breath sounds without wheezes rales or rhonchi  Card: 3/6 systolic murmur normal S1, S2 without thrills, bruits or peripheral edema  Abd:  Soft, mild diffuse tenderness similar to yesterday.  Nondistended  Skin/MSK: Tenderness palpation of the mid to upper spine.  No rashes or ulcers, skin turgor is good  Neuro:  Cranial nerves 3-12 are grossly intact, follows commands appropriately  Psych:  Good insight, oriented to person, place and time, alert     Telemetry reviewed:   normal sinus rhythm    Medications Reviewed: (see below)    Lab Data Reviewed: (see below)    ______________________________________________________________________    Medications:     Current Facility-Administered Medications   Medication Dose Route Frequency    promethazine  (PHENERGAN ) 6.25 MG/5ML syrup 12.5 mg  12.5 mg Per G Tube Q6H PRN    ceFAZolin  (ANCEF ) 1,000 mg in sterile water  10 mL IV syringe  1,000 mg IntraVENous Daily    albumin  human 25% IV solution 25 g  25 g IntraVENous PRN    epoetin  alfa-epbx (RETACRIT ) injection 10,000 Units  10,000 Units SubCUTAneous Q MWF    vitamin B-6 (PYRIDOXINE ) tablet 25 mg  25 mg Oral Q6H    prochlorperazine  (COMPAZINE ) injection 10 mg  10 mg IntraVENous Q6H PRN    0.9 % sodium chloride  infusion   IntraVENous Continuous    famotidine  (PEPCID ) tablet 20 mg  20 mg Oral Daily    diphenhydrAMINE  (BENADRYL ) injection 12.5 mg  12.5 mg IntraVENous Q8H PRN    busPIRone  (BUSPAR ) tablet 5 mg  5 mg Oral BID    diazePAM  (VALIUM ) injection 5 mg  5 mg IntraVENous Q4H PRN    sodium chloride  flush 0.9 % injection 5-40 mL  5-40 mL IntraVENous 2 times per day    sodium chloride  flush 0.9 % injection 5-40  mL  5-40 mL IntraVENous PRN    0.9 %  sodium chloride  infusion   IntraVENous PRN    heparin  (porcine) injection 5,000 Units  5,000 Units SubCUTAneous 3 times per day    polyethylene glycol (GLYCOLAX ) packet 17 g  17 g Oral Daily PRN    acetaminophen  (TYLENOL ) tablet 650 mg  650 mg Oral Q6H PRN    Or    acetaminophen  (TYLENOL ) suppository 650 mg  650 mg Rectal Q6H PRN    amLODIPine  (NORVASC ) tablet 10 mg  10 mg Oral Daily    aspirin  chewable tablet 81 mg  81 mg Oral Daily    atorvastatin  (LIPITOR ) tablet 80 mg  80 mg Oral QHS    carvedilol  (COREG ) tablet 12.5 mg  12.5 mg Oral BID WC    clopidogrel  (PLAVIX ) tablet 75 mg  75 mg Oral Daily    DULoxetine  (CYMBALTA ) extended release capsule 60 mg  60 mg Oral Daily    hydrALAZINE  (APRESOLINE ) tablet 50 mg  50 mg Oral TID    sennosides-docusate sodium  (SENOKOT-S) 8.6-50 MG tablet 1 tablet  1 tablet Oral BID    sevelamer  (RENVELA ) packet 0.8 g  0.8 g Oral TID WC    sodium bicarbonate  tablet 650 mg  650 mg Oral TID    oxyCODONE  (ROXICODONE ) immediate release tablet 5 mg  5 mg Oral Q4H PRN    Or    oxyCODONE  (ROXICODONE ) immediate release tablet 10 mg  10 mg Oral Q4H PRN    morphine  sulfate (PF) injection 2 mg  2 mg IntraVENous Q2H PRN    Or    morphine  sulfate (PF) injection 4 mg  4 mg IntraVENous Q2H PRN    insulin  lispro (HUMALOG ,ADMELOG ) injection vial 0-8 Units  0-8 Units SubCUTAneous 4x Daily AC & HS    pantoprazole  (PROTONIX ) 40 mg in sodium chloride  (PF) 0.9 % 10 mL injection  40 mg IntraVENous Daily            Lab Review:     Recent Labs     08/11/24  0220 08/12/24  0152 08/13/24  0327   WBC 7.6 8.7 6.2   HGB 9.6* 9.8* 9.2*   HCT 31.1* 31.0* 28.7*   PLT 303 297 297     Recent Labs     08/11/24  0220 08/12/24  0152 08/13/24  0327   NA 135* 136 134*   K Hemolyzed, Recollection Recommended 4.0 4.0   CL 97* 96* 98   CO2 26 26 24    BUN 11 19 24*   ALT 14 11 <5*     No components found for: GLPOC      "

## 2024-08-13 NOTE — Progress Notes (Signed)
 "Comprehensive Nutrition Assessment    Type and Reason for Visit: Reassess    Nutrition Recommendations/Plan:     Pt has been NPO x 7 days now.  Need to start EN via J-port or PPN tonight.     Recommend starting Cyclic Nocturnal TF via J-port:          - Nepro at 40 ml/hr x 10 hours (8pm-6am)         - 1 ProSource daily (Flush with 15 ml h20 before and after)         - 30 ml h20 flush q4h around the clock.    If not starting EN today, recommend ordering PPN         - Day 1: D5, 4.25% AA at 42 ml/hr + 250 ml 20% lipids daily         - Day 2: If lytes WNL, increase to 63 ml/hr + 250 ml 20% lipids daily         - Day 3: If lytes WNL, increase to goal rate of 83 ml/hr + 250 ml 20% lipids daily       Malnutrition Assessment:  Malnutrition Status:  Insufficient data (08/07/24 1316)    Context:  Chronic Illness     Findings of the 6 clinical characteristics of malnutrition:  Energy Intake:  Mild decrease in energy intake  Weight Loss:  Mild weight loss     Body Fat Loss:  Unable to assess     Muscle Mass Loss:  Unable to assess    Fluid Accumulation:  No fluid accumulation    Grip Strength:  Not Performed     Nutrition Assessment:    43 yo female admitted for HTN emergency, N/V. Pmhx: DM, Gastroparesis, HTN, ESRD - on HD, GJ tube.      11/3: Follow up.  Pt remains NPO.  Have reached out to MD regarding need to start nutrition support, J-tube feeds vs PPN.  Still having N/V.  No new weight. TF rec's below in bold.   If PPN needed, recommend goal rate of D5, 4.25% AA at 83 ml/hr + 250 ml 20% lipids daily.  This will provide 1176 kcals (86% kcal needs), 85 gm protein (100% protein needs)    10/30:  Follow up.  Pt remains NPO.  Will recommend starting TF via J port.  Still having N/V.  No new weight.      10/28: MD consult received for TF rec's. Chart review indicate G-J tube placed in August 2025 and started on cyclic TF.  Spoke with pt's daughter at bedside.  She reports pt eats by mouth at home.  Some days she has n/v and is  not able to tolerate PO so she gives herself 3 bolus feeds of NovaSource Renal 2.0 3x/day.  Pt and daughter unsure how much she gives herself, 2 large syringes full of feed.  On days when she eats and tolerates PO then she does not use feeding tube or might do one feed at night.  When I asked what foods she eats by mouth, she told me soups, cooked vegetables, and fruits.  Discussed low fiber diet necessary for gastroparesis, recommended all vegetables be cooked very soft, no beans in her soups, and fruits be canned.  Daughter states she has been losing weight, unable to quantify.  Weight hx in EMR indicates 6% loss over last 5 months which is not significant for time frame.  Weight seems to fluctuate between 115-135 lbs over last  1.5 years.       Pt currently NPO, MD wants to hold off on starting TF.  Pt was actively vomiting during my visit.  Did not complete NFPE because of this.      TF rec's when ready to start TF:  Nepro at 40 ml/hr x 10 hours + 1 ProSource daily + 30 ml h20 flush q4h around the clock.  This will provide 400 ml, 800 kcals (59% kcal needs), 52 gm protein (74% protein needs), 64 gm CHO, 290 + 30 + 180 = 500 ml water           Nutritionally Significant Medications:  NaCl at 75 ml/hr; Epo, Pepcid , Senokot, Renvela , Vit B6    Estimated Daily Nutrient Needs:  Energy Requirements Based On: Kcal/kg  Weight Used for Energy Requirements: Current  Energy (kcal/day): 1365-1640 (25-30 kcals/kg)  Weight Used for Protein Requirements: Current  Protein (g/day): 70 (1.3 gm/kg)  Method Used for Fluid Requirements: 1 ml/kcal  Fluid (ml/day): 1365    Nutrition Related Findings:   Edema: Right upper extremity      RUE Edema: Non-pitting, Trace           Recent Labs     08/11/24  0220 08/12/24  0152 08/13/24  0327   GLUCOSE 129* 162* 113*   BUN 11 19 24*   CREATININE 3.10* 4.33* 5.41*   NA 135* 136 134*   K Hemolyzed, Recollection Recommended 4.0 4.0   CL 97* 96* 98   CO2 26 26 24    CALCIUM  8.2* 8.1* 8.0*     Recent  Labs     08/10/24  1711 08/10/24  2057 08/11/24  0744 08/11/24  1115 08/11/24  1558 08/12/24  0749 08/12/24  1138 08/12/24  1620 08/12/24  1954 08/13/24  0818   POCGLU 210* 155* 124* 118* 109 154* 132* 155* 155* 122*     Lab Results   Component Value Date/Time    LABA1C 6.1 06/27/2024 05:10 AM    LABA1C 7.0 02/19/2024 05:02 AM    LABA1C 6.8 10/24/2023 03:52 AM    EAG 127 06/27/2024 05:10 AM     Triglycerides   Date Value Ref Range Status   08/07/2024 171 (H) 0 - 150 MG/DL Final     Comment:     Borderline High: 150-199 mg/dL, High: 799-500 mg/dL  Very High: Greater than or equal to 500 mg/dL     98/98/7974 844 (H) <150 MG/DL Final     Comment:     Based on NCEP-ATP III:  Triglycerides <150 mg/dL  is considered normal, 150-199 mg/dL  borderline high,  799-500 mg/dL high and  greater than or equal to 500 mg/dL very high.   09/21/2023 453 (H) <150 MG/DL Final     Comment:     Based on NCEP-ATP III:  Triglycerides <150 mg/dL  is considered normal, 150-199 mg/dL  borderline high,  799-500 mg/dL high and  greater than or equal to 500 mg/dL very high.       Last BM: 08/12/24    Wounds: Wound Type: None    Current Nutrition Therapies:  Diet: NPO  Supplements: none  Meal Intake:   No data found.  Supplement Intake:  No data found.  Nutrition Support: none    Anthropometric Measures:  Height: 149.9 cm (4' 11.02)  Ideal Body Weight (IBW): 95 lbs (43 kg)       Current Body Weight: 54.6 kg (120 lb 5.9 oz), 126.7 % IBW. Weight Source: Bed  scale  Current BMI (kg/m2): 24.3        Weight Adjustment For: No Adjustment                 BMI Categories: Normal Weight (BMI 18.5-24.9)    Wt Readings from Last 10 Encounters:   08/06/24 54.6 kg (120 lb 5.9 oz)   07/20/24 57.2 kg (126 lb 1.7 oz)   07/11/24 54.6 kg (120 lb 5.9 oz)   07/09/24 55.5 kg (122 lb 5.7 oz)   03/06/24 57.6 kg (127 lb)   02/21/24 58 kg (127 lb 13.9 oz)   11/28/23 52.2 kg (115 lb 1.3 oz)   10/31/23 52.6 kg (115 lb 15.4 oz)   10/21/23 55.2 kg (121 lb 11.1 oz)   10/12/23  60.4 kg (133 lb 3.2 oz)       Nutrition Diagnosis:   Inadequate protein-energy intake related to nausea/vomiting/diarrhea as evidenced by poor intake prior to admission    Nutrition Interventions:   Food and/or Nutrient Delivery: Start Oral Diet, Start Tube Feeding  Nutrition Education/Counseling: Education/Counseling initiated  Coordination of Nutrition Care: Continue to monitor while inpatient       Goals:  Previous Goal Met: No Progress toward Goal(s)  Goals: Meet at least 75% of estimated needs, by next RD assessment, Initiate PO diet, Initiate nutrition support       Nutrition Monitoring and Evaluation:   Behavioral-Environmental Outcomes: None Identified  Food/Nutrient Intake Outcomes: Diet Advancement/Tolerance, Food and Nutrient Intake, Supplement Intake, Enteral Nutrition Intake/Tolerance  Physical Signs/Symptoms Outcomes: Biochemical Data, Nausea or Vomiting, Weight    Discharge Planning:    Enteral Nutrition     Adella Acron, RD  Available via [employer]    "

## 2024-08-13 NOTE — Progress Notes (Signed)
"  1013: Accessed chart to print ticket to ride for transport   "

## 2024-08-13 NOTE — Dialysis (Signed)
"  Hep B verification performed for (RN): On coming dialysis nurse  Hep B results, dates, and Primary Source: HepBsAG Negative on 08/07/24; HepBsAB Susceptible on 08/07/24; Epic  Hepatitis B Surface Ag   Date/Time Value Ref Range Status   08/07/2024 01:38 AM NONREACTIVE NR   Final     Hep B S Ag Interp   Date/Time Value Ref Range Status   03/16/2023 08:19 AM Negative NEG   Final     Hep B S Ab   Date/Time Value Ref Range Status   08/07/2024 01:38 AM <3.50 mIU/mL Final     Comment:        <10.00 mIU/mL     Non-Immune  =>10.00 mIU/mL    Individual is considered to be immune to infection with HBV       Machine disinfect process:heat  "

## 2024-08-14 LAB — CBC WITH AUTO DIFFERENTIAL
Basophils %: 1.2 % — ABNORMAL HIGH (ref 0.0–1.0)
Basophils Absolute: 0.08 K/UL (ref 0.00–0.10)
Eosinophils %: 8.7 % — ABNORMAL HIGH (ref 0.0–7.0)
Eosinophils Absolute: 0.59 K/UL — ABNORMAL HIGH (ref 0.00–0.40)
Hematocrit: 31.7 % — ABNORMAL LOW (ref 35.0–47.0)
Hemoglobin: 10.3 g/dL — ABNORMAL LOW (ref 11.5–16.0)
Immature Granulocytes %: 1.6 % — ABNORMAL HIGH (ref 0.0–0.5)
Immature Granulocytes Absolute: 0.11 K/UL — ABNORMAL HIGH (ref 0.00–0.04)
Lymphocytes %: 19 % (ref 12.0–49.0)
Lymphocytes Absolute: 1.29 K/UL (ref 0.80–3.50)
MCH: 30.7 pg (ref 26.0–34.0)
MCHC: 32.5 g/dL (ref 30.0–36.5)
MCV: 94.3 FL (ref 80.0–99.0)
MPV: 9.1 FL (ref 8.9–12.9)
Monocytes %: 9 % (ref 5.0–13.0)
Monocytes Absolute: 0.61 K/UL (ref 0.00–1.00)
Neutrophils %: 60.5 % (ref 32.0–75.0)
Neutrophils Absolute: 4.11 K/UL (ref 1.80–8.00)
Nucleated RBCs: 0.3 /100{WBCs} — ABNORMAL HIGH
Platelets: 349 K/uL (ref 150–400)
RBC: 3.36 M/uL — ABNORMAL LOW (ref 3.80–5.20)
RDW: 14.4 % (ref 11.5–14.5)
WBC: 6.8 K/uL (ref 3.6–11.0)
nRBC: 0.02 K/uL — ABNORMAL HIGH (ref 0.00–0.01)

## 2024-08-14 LAB — COMPREHENSIVE METABOLIC PANEL
ALT: <5 U/L — ABNORMAL LOW (ref 10–35)
AST: 22 U/L (ref 10–35)
Albumin/Globulin Ratio: 0.8 — ABNORMAL LOW (ref 1.1–2.2)
Albumin: 2.6 g/dL — ABNORMAL LOW (ref 3.5–5.2)
Alk Phosphatase: 151 U/L — ABNORMAL HIGH (ref 35–104)
Anion Gap: 19 mmol/L — ABNORMAL HIGH (ref 2–14)
BUN/Creatinine Ratio: 5 — ABNORMAL LOW (ref 12–20)
BUN: 31 mg/dL — ABNORMAL HIGH (ref 6–20)
CO2: 21 mmol/L (ref 20–29)
Calcium: 8.2 mg/dL — ABNORMAL LOW (ref 8.6–10.0)
Chloride: 95 mmol/L — ABNORMAL LOW (ref 98–107)
Creatinine: 6.49 mg/dL — ABNORMAL HIGH (ref 0.60–1.00)
Est, Glom Filt Rate: 8 ml/min/1.73m2 — ABNORMAL LOW (ref >59)
Globulin: 3.1 g/dL (ref 2.0–4.0)
Glucose: 126 mg/dL — ABNORMAL HIGH (ref 65–100)
Potassium: 4.4 mmol/L (ref 3.5–5.1)
Sodium: 134 mmol/L — ABNORMAL LOW (ref 136–145)
Total Bilirubin: 0.3 mg/dL (ref 0.0–1.2)
Total Protein: 5.7 g/dL — ABNORMAL LOW (ref 6.4–8.3)

## 2024-08-14 LAB — POCT GLUCOSE
POC Glucose: 115 mg/dL (ref 65–117)
POC Glucose: 141 mg/dL — ABNORMAL HIGH (ref 65–117)

## 2024-08-14 LAB — PHOSPHORUS: Phosphorus: 5.3 mg/dL — ABNORMAL HIGH (ref 2.5–4.5)

## 2024-08-14 LAB — MAGNESIUM: Magnesium: 2 mg/dL (ref 1.6–2.6)

## 2024-08-14 LAB — TRIGLYCERIDES: Triglycerides: 352 mg/dL — ABNORMAL HIGH (ref 0–150)

## 2024-08-14 MED ORDER — PANTOPRAZOLE SODIUM 40 MG PO TBEC
40 | Freq: Every day | ORAL | Status: DC
Start: 2024-08-14 — End: 2024-08-17
  Administered 2024-08-14 – 2024-08-17 (×4): 40 mg via ORAL

## 2024-08-14 MED FILL — VITAMIN B-6 50 MG PO TABS: 50 mg | ORAL | Qty: 1

## 2024-08-14 MED FILL — SENNOSIDES-DOCUSATE SODIUM 8.6-50 MG PO TABS: 8.6-50 mg | ORAL | Qty: 1 | Fill #0

## 2024-08-14 MED FILL — PROCHLORPERAZINE EDISYLATE 10 MG/2ML IJ SOLN: 10 MG/2ML | INTRAMUSCULAR | Qty: 2

## 2024-08-14 MED FILL — PROCHLORPERAZINE EDISYLATE 10 MG/2ML IJ SOLN: 10 MG/2ML | INTRAMUSCULAR | Qty: 2 | Fill #0

## 2024-08-14 MED FILL — CARVEDILOL 12.5 MG PO TABS: 12.5 mg | ORAL | Qty: 1

## 2024-08-14 MED FILL — HEPARIN SODIUM (PORCINE) 5000 UNIT/ML IJ SOLN: 5000 [IU]/mL | INTRAMUSCULAR | Qty: 1 | Fill #0

## 2024-08-14 MED FILL — SODIUM BICARBONATE 650 MG PO TABS: 650 mg | ORAL | Qty: 1

## 2024-08-14 MED FILL — FAMOTIDINE 20 MG PO TABS: 20 mg | ORAL | Qty: 1

## 2024-08-14 MED FILL — HEPARIN SODIUM (PORCINE) 5000 UNIT/ML IJ SOLN: 5000 [IU]/mL | INTRAMUSCULAR | Qty: 1

## 2024-08-14 MED FILL — HYDRALAZINE HCL 25 MG PO TABS: 25 mg | ORAL | Qty: 2 | Fill #0

## 2024-08-14 MED FILL — DULOXETINE HCL 30 MG PO CPEP: 30 mg | ORAL | Qty: 2

## 2024-08-14 MED FILL — SEVELAMER CARBONATE 0.8 G PO PACK: 0.8 g | ORAL | Qty: 1

## 2024-08-14 MED FILL — BUSPIRONE HCL 10 MG PO TABS: 10 mg | ORAL | Qty: 1

## 2024-08-14 MED FILL — ALBUMIN HUMAN 25 % IV SOLN: 25 % | INTRAVENOUS | Qty: 100

## 2024-08-14 MED FILL — CEFAZOLIN SODIUM 1 G IJ SOLR: 1 g | INTRAMUSCULAR | Qty: 1000

## 2024-08-14 MED FILL — BUSPIRONE HCL 10 MG PO TABS: 10 mg | ORAL | Qty: 1 | Fill #0

## 2024-08-14 MED FILL — SODIUM BICARBONATE 650 MG PO TABS: 650 mg | ORAL | Qty: 1 | Fill #0

## 2024-08-14 MED FILL — AMLODIPINE BESYLATE 5 MG PO TABS: 5 mg | ORAL | Qty: 2

## 2024-08-14 MED FILL — ATORVASTATIN CALCIUM 20 MG PO TABS: 20 mg | ORAL | Qty: 4 | Fill #0

## 2024-08-14 MED FILL — PANTOPRAZOLE SODIUM 40 MG PO TBEC: 40 mg | ORAL | Qty: 1

## 2024-08-14 MED FILL — ASPIRIN 81 MG PO CHEW: 81 mg | ORAL | Qty: 1

## 2024-08-14 MED FILL — CLOPIDOGREL BISULFATE 75 MG PO TABS: 75 mg | ORAL | Qty: 1

## 2024-08-14 MED FILL — VITAMIN B-6 50 MG PO TABS: 50 mg | ORAL | Qty: 1 | Fill #0

## 2024-08-14 MED FILL — HYDRALAZINE HCL 25 MG PO TABS: 25 mg | ORAL | Qty: 2

## 2024-08-14 MED FILL — SENNOSIDES-DOCUSATE SODIUM 8.6-50 MG PO TABS: 8.6-50 mg | ORAL | Qty: 1

## 2024-08-14 NOTE — Care Coordination (Signed)
"  Care Management Progress Note      Reason for Admission:   Hypertensive emergency [I16.1]  Intractable nausea and vomiting [R11.2]         Patient Admission Status: Inpatient  RUR: 44%  Hospitalization in the last 30 days (Readmission):  No        Transition of care plan:  Patient was discussed in IDR and continues to be medically managed. Final ID recommendations remain pending. Vascular and nephrology are following.     Dispo: return home with family.    HD: Patient goes to [facility] on MWF. CM has updated Arlene at [facility] [phone]. CM has faxed clinical updates to [facility] at [phone].    Discharge plan communicated with patient and/or discharge caregiver: Yes      Date 1st IMM letter given: N/a. Patient has a comptroller.     Outpatient follow-up.    Transport at discharge: family.          ___________________________________________   Joan Pond, RN Case Manager  08/14/2024   1:48 PM       "

## 2024-08-14 NOTE — Consults (Signed)
 Session ID: [id number]  Session Duration: 7 minutes  Language: Spanish  Interpreter ID: [id number]  Interpreter Name: Franky

## 2024-08-14 NOTE — Progress Notes (Signed)
 "                            [place] [facility] Toto-Ambros  Date of Birth: 03-10-1981          Assessment & Plan:     ESRD on HD MWF via [facility], [facility], f/b Dr. Kalvin  Hypertensive urgency-better after HD  DM  AOCD  Chronic abd pain, n/v from gastroparesis-reason for admit  Staph bacteremia      Plan:  Cleared for use of AVG. HD today and then tomorrow to get back on schedule  HD MWF  ESA MWF       Subjective:   CC: ESRD  HPI: AVG fluid collection felt unlikely to be infectious per vascular. To get HD today. +n/v    Current Facility-Administered Medications   Medication Dose Route Frequency    pantoprazole  (PROTONIX ) tablet 40 mg  40 mg Oral QAM AC    promethazine  (PHENERGAN ) 6.25 MG/5ML syrup 12.5 mg  12.5 mg Per G Tube Q6H PRN    ceFAZolin  (ANCEF ) 1,000 mg in sterile water  10 mL IV syringe  1,000 mg IntraVENous Daily    albumin  human 25% IV solution 25 g  25 g IntraVENous PRN    epoetin  alfa-epbx (RETACRIT ) injection 10,000 Units  10,000 Units SubCUTAneous Q MWF    vitamin B-6 (PYRIDOXINE ) tablet 25 mg  25 mg Oral Q6H    prochlorperazine  (COMPAZINE ) injection 10 mg  10 mg IntraVENous Q6H PRN    famotidine  (PEPCID ) tablet 20 mg  20 mg Oral Daily    busPIRone  (BUSPAR ) tablet 5 mg  5 mg Oral BID    sodium chloride  flush 0.9 % injection 5-40 mL  5-40 mL IntraVENous 2 times per day    sodium chloride  flush 0.9 % injection 5-40 mL  5-40 mL IntraVENous PRN    0.9 % sodium chloride  infusion   IntraVENous PRN    heparin  (porcine) injection 5,000 Units  5,000 Units SubCUTAneous 3 times per day    polyethylene glycol (GLYCOLAX ) packet 17 g  17 g Oral Daily PRN    acetaminophen  (TYLENOL ) tablet 650 mg  650 mg Oral Q6H PRN    Or    acetaminophen  (TYLENOL ) suppository 650 mg  650 mg Rectal Q6H PRN    amLODIPine  (NORVASC ) tablet 10 mg  10 mg Oral Daily    aspirin  chewable tablet 81 mg  81 mg Oral Daily    atorvastatin  (LIPITOR ) tablet 80 mg  80 mg Oral QHS    carvedilol  (COREG ) tablet 12.5  mg  12.5 mg Oral BID WC    clopidogrel  (PLAVIX ) tablet 75 mg  75 mg Oral Daily    DULoxetine  (CYMBALTA ) extended release capsule 60 mg  60 mg Oral Daily    hydrALAZINE  (APRESOLINE ) tablet 50 mg  50 mg Oral TID    sennosides-docusate sodium  (SENOKOT-S) 8.6-50 MG tablet 1 tablet  1 tablet Oral BID    sevelamer  (RENVELA ) packet 0.8 g  0.8 g Oral TID WC    sodium bicarbonate  tablet 650 mg  650 mg Oral TID    oxyCODONE  (ROXICODONE ) immediate release tablet 5 mg  5 mg Oral Q4H PRN    Or    oxyCODONE  HCl (OXY-IR) immediate release tablet 10 mg  10 mg Oral Q4H PRN    morphine  sulfate (PF) injection 2 mg  2 mg IntraVENous Q2H PRN    insulin   lispro (HUMALOG ,ADMELOG ) injection vial 0-8 Units  0-8 Units SubCUTAneous 4x Daily AC & HS          Objective:     Vitals:  Blood pressure (!) 186/86, pulse 86, temperature 97.5 F (36.4 C), temperature source Oral, resp. rate 16, height 1.499 m (4' 11.02), weight 54.6 kg (120 lb 5.9 oz), SpO2 96%.  Temp (24hrs), Avg:98.1 F (36.7 C), Min:97.5 F (36.4 C), Max:98.8 F (37.1 C)      Intake and Output:  No intake/output data recorded.  11/02 1901 - 11/04 0700  In: 966 [P.O.:200; I.V.:766]  Out: -     Physical Exam:               GENERAL ASSESSMENT: NAD  HEENT: Nontraumatic   EXTREMITY: no EDEMA; LUE AVG NT, no swelling/warmth, good t/b  NEURO: Grossly non focal          ECG/rhythm:    Data Review        Recent Labs     08/12/24  0152 08/13/24  0327 08/14/24  0748   NA 136 134* 134*   K 4.0 4.0 4.4   CL 96* 98 95*   CO2 26 24 21    BUN 19 24* 31*   CREATININE 4.33* 5.41* 6.49*   GLUCOSE 162* 113* 126*   CALCIUM  8.1* 8.0* 8.2*       : Brena Aran, MD  08/14/2024        [facility]:  [email or web address]  [email or web address]    [place] office:  [address], [address]  [place], [place] [address]  Phone: [phone]  Fax :     [phone]    [facility] office:  [address]  [place], [place]  [address]  Phone - [phone]  Fax - [phone]                                                                                         "

## 2024-08-14 NOTE — Consults (Signed)
 "Vascular Surgery Consult Note  08/14/2024    Subjective:     Melinda [name] is a 43 y.o. female who was admitted for abdominal pain. Found to have bacteremia likely due to UTI. Vascular consult for concerns of abscess to her fistula.  Her left upper arm [place].   There are no issues with dialysis access or running. There is no pain or swelling to her arm. She is able to use her arm. Seen resting while undergoing dialysis. Discussed with daughter.     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)     Gastroparesis     Gastroparesis     GERD (gastroesophageal reflux disease)     High cholesterol     Hypertension       Past Surgical History:   Procedure Laterality Date    CAPSULE ENDOSCOPY N/A 01/20/2023    ESOPHAGEAL CAPSULE ENDOSCOPY remove at 1624PM performed by Madlyn Fendt, MD at [facility] ENDOSCOPY    CHOLECYSTECTOMY, LAPAROSCOPIC N/A 02/03/2023    ROBOTIC LAPAROSCOPIC CHOLECYSTECTOMY with Indocyanine green  performed by Chrystal Elijah BRAVO, MD at [facility] MAIN OR    COLONOSCOPY N/A 01/19/2023    COLONOSCOPY DIAGNOSTIC performed by Madlyn Fendt, MD at [facility] ENDOSCOPY    INVASIVE VASCULAR N/A 10/03/2023    Angiography visceral SMA performed by Millard Oneil LABOR, MD at [facility] CARDIAC CATH LAB    INVASIVE VASCULAR N/A 10/03/2023    Ultrasound guided vascular access performed by Millard Oneil LABOR, MD at [facility] CARDIAC CATH LAB    INVASIVE VASCULAR N/A 10/03/2023    Insert stent peripheral artery performed by Millard Oneil LABOR, MD at [facility] CARDIAC CATH LAB    IR NONTUNNELED VASCULAR CATHETER > 5 YEARS  10/10/2023    IR NONTUNNELED VASCULAR CATHETER > 5 YEARS 10/10/2023 [facility] CARDIAC CATH/EP/IR LAB    IR NONTUNNELED VASCULAR CATHETER > 5 YEARS  10/10/2023    IR NONTUNNELED VASCULAR CATHETER > 5 YEARS 10/10/2023 [facility] CARDIAC CATH/EP/IR LAB    OTHER SURGICAL HISTORY Left     Rentia attachment    TUBAL LIGATION  Bilateral     UPPER GASTROINTESTINAL ENDOSCOPY N/A 01/17/2023    ESOPHAGOGASTRODUODENOSCOPY performed by Madlyn Fendt, MD at [facility] ENDOSCOPY    UPPER GASTROINTESTINAL ENDOSCOPY N/A 01/17/2023    ESOPHAGOGASTRODUODENOSCOPY BIOPSY performed by Madlyn Fendt, MD at [facility] ENDOSCOPY    UPPER GASTROINTESTINAL ENDOSCOPY N/A 01/18/2023    ESOPHAGOGASTRODUODENOSCOPY performed by Madlyn Fendt, MD at [facility] ENDOSCOPY    UPPER GASTROINTESTINAL ENDOSCOPY N/A 05/13/2023    ESOPHAGOGASTRODUODENOSCOPY performed by Roseann Lonni BIRCH, MD at [facility] ENDOSCOPY    UPPER GASTROINTESTINAL ENDOSCOPY N/A 05/13/2023    ESOPHAGOGASTRODUODENOSCOPY BIOPSY performed by Roseann Lonni BIRCH, MD at [facility] ENDOSCOPY    UPPER GASTROINTESTINAL ENDOSCOPY N/A 09/21/2023    ESOPHAGOGASTRODUODENOSCOPY performed by Madlyn Fendt, MD at [facility] ENDOSCOPY    UPPER GASTROINTESTINAL ENDOSCOPY N/A 09/21/2023    ESOPHAGOGASTRODUODENOSCOPY BIOPSY performed by Madlyn Fendt, MD at [facility] ENDOSCOPY    US  FLUID COLLECTION DRAINAGE PERITONEAL/RETROPERITONEAL [facility]  02/11/2023    US  ABSCESS DRAINAGE PERITONEAL 02/11/2023 [facility] RAD US      No family history on file.   Social History     Tobacco Use    Smoking status: Never    Smokeless tobacco: Never   Substance Use Topics    Alcohol use: Never  Prior to Admission medications   Medication Sig Start Date End Date Taking? Authorizing Provider   metoclopramide  (REGLAN ) 5 MG tablet Take 1 tablet by mouth 3 times daily as needed (nausea/vomiting) 07/20/24 07/25/24  Cutchins, Abigail M, PA-C   lidocaine  4 % external patch Place 1 patch onto the skin daily 07/02/24   Karenann Barter, MD   sevelamer  (RENVELA ) 0.8 g PACK packet Take 1 packet by mouth 3 times daily (with meals) 07/02/24   Karenann Barter, MD   atorvastatin  (LIPITOR ) 80 MG tablet Take 1 tablet by mouth nightly    [provider]   carvedilol  (COREG ) 12.5 MG tablet Take 1 tablet by mouth 2 times daily (with meals)    [provider]   famotidine  (PEPCID ) 20 MG tablet Take 1  tablet by mouth 2 times daily    [provider]   hydrALAZINE  (APRESOLINE ) 50 MG tablet Take 1 tablet by mouth 3 times daily    [provider]   sodium bicarbonate  650 MG tablet Take 1 tablet by mouth 3 times daily 02/23/24   Muncy, Manuelita Bohr, MD   pantoprazole  (PROTONIX ) 40 MG tablet Take 1 tablet by mouth 2 times daily (before meals) 02/23/24   Muncy, Manuelita Bohr, MD   sennosides-docusate sodium  (SENOKOT-S) 8.6-50 MG tablet Take 1 tablet by mouth in the morning and at bedtime 11/06/23   Do, Khoi B, MD   ondansetron  (ZOFRAN -ODT) 4 MG disintegrating tablet Take 1 tablet by mouth every 8 hours as needed for Nausea or Vomiting 11/06/23   Do, Rodrick NOVAK, MD   prochlorperazine  (COMPAZINE ) 10 MG tablet Take 1 tablet by mouth every 6 hours as needed (nausea or vomiting) 10/25/23   Orson Planas, MD   metFORMIN  (GLUCOPHAGE -XR) 500 MG extended release tablet Take 2 tablets by mouth daily (with breakfast)  Patient not taking: Reported on 06/26/2024 10/25/23   Aktig, Ziya, MD   busPIRone  (BUSPAR ) 15 MG tablet Take 15 mg by mouth 3 times daily  Patient taking differently: Take 5 mg by mouth in the morning and at bedtime 10/14/23   Tefera, Mesfin A, MD   DULoxetine  (CYMBALTA ) 60 MG extended release capsule Take 1 capsule by mouth daily 10/15/23   Tefera, Mesfin A, MD   clopidogrel  (PLAVIX ) 75 MG tablet Take 1 tablet by mouth daily 10/15/23   Tefera, Mesfin A, MD   aspirin  81 MG chewable tablet Take 1 tablet by mouth daily 10/15/23   Tefera, Mesfin A, MD   amLODIPine  (NORVASC ) 10 MG tablet Take 1 tablet by mouth daily 03/23/23   Do, Khoi B, MD     No Known Allergies     Review of Systems   Constitutional:  Negative for fatigue and fever.   Respiratory:  Negative for shortness of breath.    Cardiovascular:  Negative for chest pain and leg swelling.   Skin:  Negative for color change and wound.   Neurological:  Negative for dizziness and speech difficulty.       Objective:     Patient Vitals for the past 24 hrs:   BP Temp  Temp src Pulse Resp SpO2   08/14/24 1245 (!) 151/97 -- -- 89 -- --   08/14/24 1230 (!) 174/84 -- -- 90 -- --   08/14/24 1215 (!) 159/97 -- -- 89 -- --   08/14/24 1200 130/72 -- -- 88 -- --   08/14/24 1145 126/61 -- -- 85 -- --   08/14/24 1130 139/72 -- -- 87 -- --  08/14/24 1115 113/68 -- -- 84 -- --   08/14/24 1100 134/74 -- -- 85 -- --   08/14/24 1045 (!) 171/80 -- -- 80 -- --   08/14/24 1037 (!) 176/82 -- -- 80 -- --   08/14/24 1030 (!) 185/87 97.6 F (36.4 C) -- 86 16 97 %   08/14/24 0823 (!) 186/86 97.5 F (36.4 C) Oral 86 16 96 %   08/13/24 2118 (!) 164/74 98.1 F (36.7 C) -- 80 18 94 %   08/13/24 1753 (!) 183/87 -- -- -- -- --   08/13/24 1613 -- -- -- -- 19 --   08/13/24 1545 (!) 188/81 98.8 F (37.1 C) Oral 85 19 96 %        Physical Exam  Constitutional:       Appearance: Normal appearance.   Cardiovascular:      Rate and Rhythm: Normal rate and regular rhythm.      Pulses: Normal pulses.   Pulmonary:      Effort: Pulmonary effort is normal. No respiratory distress.   Abdominal:      General: There is no distension.      Palpations: Abdomen is soft.   Musculoskeletal:      Cervical back: Normal range of motion and neck supple.      Right lower leg: No edema.      Left lower leg: No edema.   Skin:     General: Skin is warm and dry.   Neurological:      General: No focal deficit present.      Mental Status: She is alert. Mental status is at baseline.           Assessment/Plan:     ESRD   Concerns of fistual abscess  - Per Dr. Rory Neither the CT scan nor the duplex look sinister to me.   It is certainly not an abscess and it looks like a chronic hematoma to me  - On exam there is no erythema or swelling. No overt clinic signs of infection at the fistula site.   - Antibiotics per Dr. Levern. Should blood cultures be positive can consider fistula exploration     Signed By: Tinnie FORBES Boas, APRN - NP     August 14, 2024             "

## 2024-08-14 NOTE — Dialysis (Signed)
"  Hep B verification performed for (RN): M Dinep, RN  Hep B results, dates, and Primary Source: Neg/Susc 08/07/2024, Epic  Hepatitis B Surface Ag   Date/Time Value Ref Range Status   08/07/2024 01:38 AM NONREACTIVE NR   Final     Hep B S Ag Interp   Date/Time Value Ref Range Status   03/16/2023 08:19 AM Negative NEG   Final     Hep B S Ab   Date/Time Value Ref Range Status   08/07/2024 01:38 AM <3.50 mIU/mL Final     Comment:        <10.00 mIU/mL     Non-Immune  =>10.00 mIU/mL    Individual is considered to be immune to infection with HBV       Machine disinfect process:Standard Heat  "

## 2024-08-14 NOTE — Progress Notes (Signed)
"  New patient PCP appointment set for Thursday November 13th 2025 at 2:20 PM with Briscoe Dale Rummer at their [place] location.   "

## 2024-08-14 NOTE — Flowsheet Note (Signed)
 "Primary RN SBAR:  Melinda Pulling  RN  Incapacitated Nurse edu. provided: YES  Patient Education provided: [place] TIME  Preferred Education method and Primary language: SPANISH, VERBAL  Dialysis consent: [facility] General Consent Verified: [facility] associated wait time; reason: NONE  Hepatitis B Surface Ag   Date/Time Value Ref Range Status   08/07/2024 01:38 AM NONREACTIVE NR   Final     Hep B S Ag Interp   Date/Time Value Ref Range Status   03/16/2023 08:19 AM Negative NEG   Final     Hep B S Ab   Date/Time Value Ref Range Status   08/07/2024 01:38 AM <3.50 mIU/mL Final     Comment:        <10.00 mIU/mL     Non-Immune  =>10.00 mIU/mL    Individual is considered to be immune to infection with HBV       Hep B results, dates, and Primary Source: NEGATIVE/SUSCEPTIBLE  08/07/2024  EPIC  Hep B dual verification performed by (RN): REDELL QUA RN  Machine disinfect process:ACID/.HEAT   08/14/24 1030   Treatment   Treatment Goal 2000   Heb B Surface Ag Negative   Date - Heb B Surface Ag 08/07/24   Heb B Surface Ab <10   Date - Heb B Surface Ab 08/07/24   Observations & Evaluations   Level of Consciousness 0   Oriented X 4   Heart Rhythm Regular   Respiratory Quality/Effort Unlabored   O2 Device None (Room air)   Bilateral Breath Sounds Diminished   Appetite Good   Abdomen Inspection Soft;Other (Comment)   Bowel Sounds (All Quadrants) Present   Edema None   RUE Edema Trace   Vital Signs   BP (!) 185/87   Temp 97.6 F (36.4 C)   Pulse 86   Respirations 16   SpO2 97 %   Pain Assessment   Pain Assessment 0-10   Technical Checks   Dialysis Machine No. 1   RO Machine Number [id number]   Dialyzer Lot No. [id number]   Tubing Lot Number [id number]   All Connections Secure Yes   NS Bag Yes   Saline Line Double Clamped Yes   Dialyzer F-180   Prime Volume (mL) 200 mL   ICEBOAT I;C;E;B;A;T;O   RO Machine Log Sheet Completed Yes   Machine Alarm Self Test Passed;Completed   Air Foam Detector Tested;Proper Function;pH Reading    Sport And Exercise Psychologist Conductivity 13.8   Manual Conductivity 13.8   Machine Ph 7.4   Manual Ph 7.4   Bleach Test (Neg) Yes   Bath Temperature 96.8 F (36 C)   Hemodialysis Fistula/Graft Arteriovenous fistula Left Arm   No placement date or time found.   Present on Admission/Arrival: Yes  Access Type: Arteriovenous fistula  Orientation: Left  Access Location: Arm   Site Assessment Clean, dry & intact   Thrill Present   Bruit Present   Status Accessed   Venous Needle Size 15 G   Arterial Needle Size 15 G   Accessed By: Shalynn Jorstad rn   Access Attempts  1   Date of Last Dressing Change 08/10/24   Access Interventions Chlorhexidine;Aseptic Technique;Needles taped to patient   Dressing Intervention New;Dressing changed   Dressing Status Clean, dry & intact   Dialysis Bath   K+ (Potassium) 3   Ca+ (Calcium ) 2.5   Na+ (Sodium) 138   HCO3 (Bicarb) 35   Bicarbonate Concentrate Lot  No. [id number]   Acid Concentrate Lot No. [id number]   Handoff   Handoff Given To [facility] Kristian Hazzard RN   Handoff Received From Jackelyn Donley peak     Tx Started   08/14/24 1037   Treatment   Time On 1037   Treatment Goal 2000   Treatment Initiation   Dialyze Hours 3   Treatment  Initiation Universal Precautions maintained;Lines secured to patient;Connections secured;Prime given;Venous Parameters set;Arterial Parameters set;It consultant engaged;Saline line double clamped;F180   During Hemodialysis Assessment   Blood Flow Rate (ml/min) 400 ml/min   Arterial Pressure (mmHg) -100 mmHg   Venous Pressure (mmHg) 160   TMP 40   DFR 600   Comments tx started   Access Visible Yes   Ultrafiltration Rate (ml/hr) 840 ml/hr   Ultrafiltration Removed (ml) 0 ml        08/14/24 1341   Treatment   Time Off 1341   Treatment Goal 2000   Observations & Evaluations   Level of Consciousness 0   Oriented X 4   Heart Rhythm Regular   Respiratory Quality/Effort Unlabored   O2 Device None (Room air)   Bilateral Breath Sounds Diminished    Appetite Good   Abdomen Inspection Soft;Other (Comment)   Bowel Sounds (All Quadrants) Present   Edema None   RUE Edema Trace   Vital Signs   BP (!) 183/99   Temp 97.6 F (36.4 C)   Pulse 95   Respirations 16   SpO2 97 %   Pain Assessment   Pain Assessment 0-10   During Hemodialysis Assessment   Blood Flow Rate (ml/min) 400 ml/min   Arterial Pressure (mmHg) -140 mmHg   Venous Pressure (mmHg) 170   TMP 30   DFR 600   Access Visible Yes   Ultrafiltration Rate (ml/hr) 840 ml/hr   Ultrafiltration Removed (ml) 2500 ml   Hemodialysis Fistula/Graft Arteriovenous fistula Left Arm   No placement date or time found.   Present on Admission/Arrival: Yes  Access Type: Arteriovenous fistula  Orientation: Left  Access Location: Arm   Site Assessment Clean, dry & intact   Thrill Present   Bruit Present   Status Deaccessed   Venous Needle Size 15 G   Arterial Needle Size 15 G   Post-Hemodialysis Assessment   Post-Treatment Procedures Blood returned;Access bleeding time > 10 minutes   Machine Disinfection Process Acid/Vinegar Clean;Heat Disinfect;Exterior Engineer, Agricultural Volume (ml) 300 ml   Blood Volume Processed (Liters) 62.7 L   Dialyzer Clearance Lightly streaked   Duration of Treatment (minutes) 180 minutes   Hemodialysis Intake (ml) 500 ml   Hemodialysis Output (ml) 2500 ml   NET Removed (ml) 2000   Handoff   Handoff Given To Melinda Pulling rn   Handoff Received From [facility] Elka Satterfield RN   Handoff Communication Other  (perfect serve)   Time Handoff Given 1357   End of Shift Check Performed N/A     Comments: Tx completed as ordered. Pressures stable, no issues noted. Pt sent back to room.   "

## 2024-08-14 NOTE — Progress Notes (Addendum)
 "    Hospitalist Progress Note      NAME: Katiejo Gilroy   DOB:  October 26, 1980  MRM:  [id number]    Date/Time: 08/14/2024  2:18 PM           Assessment / Plan:   Sepsis secondary to Staph aureus bacteremia.  Sepsis secondary to UTI  Echo without vegetation.  MRI spine with degenerative changes no signs of infection.  CT graft of left arm concerning for mild infection versus old hematoma per vascular okay to use graft.  Seen by vascular while inpatient  -ID on board appreciate recs  - Pending antibiotic recs to be done along with dialysis through port    Gastroparesis.    S/p GJ tube- Required GJ tube placement for adequate nutrition.  POA initially presented with abdominal pain nausea vomiting now has resolved.  CT abdomen pelvis without acute process.  Does have GJ tube  - Tolerating tube feeds which started on 08/13/2024, goal rate is 40 now she is at 30  - Will work on uptitrating weight today    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  POA  - Phosphorus downtrending to normal continue sevelamer     Prolonged QTc POA 502  - Avoid Zofran  continue Compazine     Fentanyl  positive: Seen on drug screen.  May be a false positive in the setting of Benadryl  use.  Patient adamantly denies use.     Hypertension POA. Improved  Will continue patient's home Coreg /hydralazine /amlodipine   Lopressor  PRN for SBP >180     ESRD  On HD   Nephrology following     GERD: Chronic.    Continue Pepcid  and pantop     T2DM with gastroparesis. POA, chronic.  Last A1c of 6.1 on 9/17.    -Sliding scale insulin .      Chronic pain or anxiety/depression  Duloxetine  and BuSpar .     DAPT: Unclear why patient is on dual antiplatelet therapy.  Will continue for now.  May need to be titrated off outpatient     Anemia: Trend.  Follow-up outpatient      Social determinants of health: complicating care   Social Drivers of Health     Tobacco Use: Low Risk  (07/20/2024)    Patient History     Smoking Tobacco Use: Never     Smokeless Tobacco  Use: Never     Passive Exposure: Not on file   Alcohol Use: Not At Risk (05/01/2024)    Received from [facility]    AUDIT-C     Q1: How often do you have a drink containing alcohol?: Never     Q2: How many drinks containing alcohol do you have on a typical day when you are drinking?: Patient does not drink     Q3: How often do you have six or more drinks on one occasion?: Never   Financial Resource Strain: Low Risk  (05/02/2024)    Received from [facility]    Overall Financial Resource Strain (CARDIA)     Difficulty of Paying Living Expenses: Not hard at all   Recent Concern: Financial Resource Strain - High Risk (04/02/2024)    Received from [facility]    Overall Financial Resource Strain (CARDIA)     Difficulty of Paying Living Expenses: Hard   Food Insecurity: No Food Insecurity (08/06/2024)    Hunger Vital Sign     Worried About Running Out of Food in the Last Year: Never  true     Ran Out of Food in the Last Year: Never true   Transportation Needs: No Transportation Needs (08/06/2024)    PRAPARE - Therapist, Art (Medical): No     Lack of Transportation (Non-Medical): No   Physical Activity: Not on file   Stress: Not on file   Social Connections: Not on file   Intimate Partner Violence: Not on file   Depression: Not at risk (04/20/2024)    Received from [facility]    PHQ-2     Patient Health Questionnaire-2 Score: 0   Housing Stability: Low Risk  (08/06/2024)    Housing Stability Vital Sign     Unable to Pay for Housing in the Last Year: No     Number of Times Moved in the Last Year: 0     Homeless in the Last Year: No   Interpersonal Safety: Not At Risk (08/06/2024)    Interpersonal Safety Domain Source: IP Abuse Screening     Physical abuse: Denies     Verbal abuse: Denies     Emotional abuse: Denies     Financial abuse: Denies     Sexual abuse: Denies   Utilities: Not At Risk (08/06/2024)    [facility] Utilities      Threatened with loss of utilities: No        I have personally reviewed the radiographs, laboratory data in Epic and decisions and statements above are based partially on this personal interpretation.Needs continued hospitalization for further management                 Care Plan Discussed: family, RN, case manager, specialist    Prophylaxis:  dapt    Disposition:  independent    Time spent 35 min        ___________________________________________________  Rolan DELENA Penton, MD        Subjective:     Chief Complaint: Patient reports that she has been tolerating tube feeds with very minimal abdominal pain no nausea vomiting       Objective:       Vitals:          Last 24hrs VS reviewed since prior progress note. Most recent are:    Vitals:    08/14/24 1341   BP: (!) 183/99   Pulse: 95   Resp: 16   Temp: 97.6 F (36.4 C)   SpO2: 97%     SpO2 Readings from Last 6 Encounters:   08/14/24 97%   07/20/24 94%   07/11/24 98%   07/09/24 98%   07/05/24 95%   07/02/24 95%          Intake/Output Summary (Last 24 hours) at 08/14/2024 1418  Last data filed at 08/14/2024 1341  Gross per 24 hour   Intake 550 ml   Output 2500 ml   Net -1950 ml          Exam:     Physical Exam:    General Alert. No distress.   HENT Head Normocephalic and atraumatic.   Eyes Conjunctivae no discharge. No scleral icterus.     Cardio Normal rate, regular rhythm. No murmur, gallop. No chest wall tenderness.    Pulmonary Effort normal. Breath sounds normal. No respiratory distress. No wheezes, rales, or rhonchi.    Abdominal Soft. Bowel sounds normal. No distension/tenderness.     G-tube in place no erythema   Extremities No edema of lower extremities. No tenderness.  Neurological No focal deficits.   Skin Skin is warm and dry. No rash noted. No erythema.    Psychiatric Mood, affect, and judgment normal.         Telemetry reviewed:   normal sinus rhythm    Medications Reviewed: (see below)    Lab Data Reviewed: (see  below)    ______________________________________________________________________    Medications:     Current Facility-Administered Medications   Medication Dose Route Frequency    pantoprazole  (PROTONIX ) tablet 40 mg  40 mg Oral QAM AC    promethazine  (PHENERGAN ) 6.25 MG/5ML syrup 12.5 mg  12.5 mg Per G Tube Q6H PRN    ceFAZolin  (ANCEF ) 1,000 mg in sterile water  10 mL IV syringe  1,000 mg IntraVENous Daily    albumin  human 25% IV solution 25 g  25 g IntraVENous PRN    epoetin  alfa-epbx (RETACRIT ) injection 10,000 Units  10,000 Units SubCUTAneous Q MWF    vitamin B-6 (PYRIDOXINE ) tablet 25 mg  25 mg Oral Q6H    prochlorperazine  (COMPAZINE ) injection 10 mg  10 mg IntraVENous Q6H PRN    famotidine  (PEPCID ) tablet 20 mg  20 mg Oral Daily    busPIRone  (BUSPAR ) tablet 5 mg  5 mg Oral BID    sodium chloride  flush 0.9 % injection 5-40 mL  5-40 mL IntraVENous 2 times per day    sodium chloride  flush 0.9 % injection 5-40 mL  5-40 mL IntraVENous PRN    0.9 % sodium chloride  infusion   IntraVENous PRN    heparin  (porcine) injection 5,000 Units  5,000 Units SubCUTAneous 3 times per day    polyethylene glycol (GLYCOLAX ) packet 17 g  17 g Oral Daily PRN    acetaminophen  (TYLENOL ) tablet 650 mg  650 mg Oral Q6H PRN    Or    acetaminophen  (TYLENOL ) suppository 650 mg  650 mg Rectal Q6H PRN    amLODIPine  (NORVASC ) tablet 10 mg  10 mg Oral Daily    aspirin  chewable tablet 81 mg  81 mg Oral Daily    atorvastatin  (LIPITOR ) tablet 80 mg  80 mg Oral QHS    carvedilol  (COREG ) tablet 12.5 mg  12.5 mg Oral BID WC    clopidogrel  (PLAVIX ) tablet 75 mg  75 mg Oral Daily    DULoxetine  (CYMBALTA ) extended release capsule 60 mg  60 mg Oral Daily    hydrALAZINE  (APRESOLINE ) tablet 50 mg  50 mg Oral TID    sennosides-docusate sodium  (SENOKOT-S) 8.6-50 MG tablet 1 tablet  1 tablet Oral BID    sevelamer  (RENVELA ) packet 0.8 g  0.8 g Oral TID WC    sodium bicarbonate  tablet 650 mg  650 mg Oral TID    oxyCODONE  (ROXICODONE ) immediate release tablet 5 mg   5 mg Oral Q4H PRN    Or    oxyCODONE  HCl (OXY-IR) immediate release tablet 10 mg  10 mg Oral Q4H PRN    morphine  sulfate (PF) injection 2 mg  2 mg IntraVENous Q2H PRN    insulin  lispro (HUMALOG ,ADMELOG ) injection vial 0-8 Units  0-8 Units SubCUTAneous 4x Daily AC & HS            Lab Review:     Recent Labs     08/12/24  0152 08/13/24  0327 08/14/24  0748   WBC 8.7 6.2 6.8   HGB 9.8* 9.2* 10.3*   HCT 31.0* 28.7* 31.7*   PLT 297 297 349     Recent Labs     08/12/24  9847 08/13/24  0327 08/14/24  0748   NA 136 134* 134*   K 4.0 4.0 4.4   CL 96* 98 95*   CO2 26 24 21    BUN 19 24* 31*   MG  --   --  2.0   PHOS  --   --  5.3*   ALT 11 <5* <5*     No components found for: GLPOC        "

## 2024-08-14 NOTE — Progress Notes (Signed)
"  Spiritual Health Attempted Visit Note  [facility]      Room # 426/01    Name: Melinda [name]           Age: 43 y.o.    Gender: female          MRN: [id number]  Religion: Catholic       Preferred Language: Spanish      Date: 08/14/24  Visit Time: Begin Time: 1150 End Time : 1155 Complexity of Encounter: Low      Visit Summary: Chaplain attempted to meet with patient in response to Catholic rounds. Patient was out of room. Prayer for spiritual communion offered.  No family was in the room at the time of the visit,  Chaplain available for follow-up as needed.      Patient was not available. Patient was working with other staff/was asleep/ indisposed. Chaplain will follow-up at a later time.        Electronically signed by Elia Karolee Buel Charlott, SBS, RN, ACSW, LCSW  Chaplain Page:  [phone])  "

## 2024-08-14 NOTE — Plan of Care (Signed)
"    Problem: Pain  Goal: Verbalizes/displays adequate comfort level or baseline comfort level  Outcome: Progressing     Problem: Safety - Adult  Goal: Free from fall injury  Outcome: Progressing     "

## 2024-08-15 LAB — CBC WITH AUTO DIFFERENTIAL
Basophils %: 1 % (ref 0.0–1.0)
Basophils Absolute: 0.06 K/UL (ref 0.00–0.10)
Eosinophils %: 5.2 % (ref 0.0–7.0)
Eosinophils Absolute: 0.33 K/UL (ref 0.00–0.40)
Hematocrit: 33.4 % — ABNORMAL LOW (ref 35.0–47.0)
Hemoglobin: 10.5 g/dL — ABNORMAL LOW (ref 11.5–16.0)
Immature Granulocytes %: 1.6 % — ABNORMAL HIGH (ref 0.0–0.5)
Immature Granulocytes Absolute: 0.1 K/UL — ABNORMAL HIGH (ref 0.00–0.04)
Lymphocytes %: 19.8 % (ref 12.0–49.0)
Lymphocytes Absolute: 1.25 K/UL (ref 0.80–3.50)
MCH: 29.9 pg (ref 26.0–34.0)
MCHC: 31.4 g/dL (ref 30.0–36.5)
MCV: 95.2 FL (ref 80.0–99.0)
MPV: 8.9 FL (ref 8.9–12.9)
Monocytes %: 11.3 % (ref 5.0–13.0)
Monocytes Absolute: 0.71 K/UL (ref 0.00–1.00)
Neutrophils %: 61.1 % (ref 32.0–75.0)
Neutrophils Absolute: 3.85 K/UL (ref 1.80–8.00)
Nucleated RBCs: 1.3 /100{WBCs} — ABNORMAL HIGH
Platelets: 383 K/uL (ref 150–400)
RBC: 3.51 M/uL — ABNORMAL LOW (ref 3.80–5.20)
RDW: 14.4 % (ref 11.5–14.5)
WBC: 6.3 K/uL (ref 3.6–11.0)
nRBC: 0.08 K/uL — ABNORMAL HIGH (ref 0.00–0.01)

## 2024-08-15 LAB — POCT GLUCOSE
POC Glucose: 147 mg/dL — ABNORMAL HIGH (ref 65–117)
POC Glucose: 145 mg/dL — ABNORMAL HIGH (ref 65–117)

## 2024-08-15 LAB — COMPREHENSIVE METABOLIC PANEL
ALT: <5 U/L — ABNORMAL LOW (ref 10–35)
AST: 20 U/L (ref 10–35)
Albumin/Globulin Ratio: 0.9 — ABNORMAL LOW (ref 1.1–2.2)
Albumin: 2.5 g/dL — ABNORMAL LOW (ref 3.5–5.2)
Alk Phosphatase: 141 U/L — ABNORMAL HIGH (ref 35–104)
Anion Gap: 17 mmol/L — ABNORMAL HIGH (ref 2–14)
BUN/Creatinine Ratio: 4 — ABNORMAL LOW (ref 12–20)
BUN: 16 mg/dL (ref 6–20)
CO2: 24 mmol/L (ref 20–29)
Calcium: 8.1 mg/dL — ABNORMAL LOW (ref 8.6–10.0)
Chloride: 97 mmol/L — ABNORMAL LOW (ref 98–107)
Creatinine: 4.57 mg/dL — ABNORMAL HIGH (ref 0.60–1.00)
Est, Glom Filt Rate: 12 ml/min/1.73m2 — ABNORMAL LOW (ref >59)
Globulin: 2.9 g/dL (ref 2.0–4.0)
Glucose: 158 mg/dL — ABNORMAL HIGH (ref 65–100)
Potassium: 3.8 mmol/L (ref 3.5–5.1)
Sodium: 138 mmol/L (ref 136–145)
Total Bilirubin: <0.2 mg/dL (ref 0.0–1.2)
Total Protein: 5.4 g/dL — ABNORMAL LOW (ref 6.4–8.3)

## 2024-08-15 LAB — MAGNESIUM: Magnesium: 1.9 mg/dL (ref 1.6–2.6)

## 2024-08-15 LAB — PHOSPHORUS: Phosphorus: 4 mg/dL (ref 2.5–4.5)

## 2024-08-15 MED ORDER — FAT EMULSION PLANT BASED (SOY) 20 % IV EMUL
20 | INTRAVENOUS | Status: DC
Start: 2024-08-15 — End: 2024-08-16

## 2024-08-15 MED ORDER — SODIUM CHLORIDE 0.9 % IV SOLN
0.9 | INTRAVENOUS | Status: DC
Start: 2024-08-15 — End: 2024-08-15

## 2024-08-15 MED ORDER — AMLODIPINE BESYLATE 5 MG PO TABS
5 | Freq: Every day | ORAL | Status: DC
Start: 2024-08-15 — End: 2024-08-16

## 2024-08-15 MED ORDER — TRACE MINERALS CU-MN-SE-ZN 300-55-60-3000 MCG/ML IV SOLN
300-55-60-3000 | INTRAVENOUS | Status: DC
Start: 2024-08-15 — End: 2024-08-16

## 2024-08-15 MED ORDER — CLOPIDOGREL BISULFATE 75 MG PO TABS
75 | Freq: Every day | ORAL | Status: DC
Start: 2024-08-15 — End: 2024-08-28
  Administered 2024-08-16 – 2024-08-26 (×8): 75 mg via GASTROSTOMY

## 2024-08-15 MED ORDER — SODIUM CHLORIDE 0.9 % IV SOLN
0.9 | INTRAVENOUS | Status: DC | PRN
Start: 2024-08-15 — End: 2024-08-26

## 2024-08-15 MED ORDER — ASPIRIN 81 MG PO CHEW
81 | Freq: Every day | ORAL | Status: DC
Start: 2024-08-15 — End: 2024-08-28
  Administered 2024-08-16 – 2024-08-26 (×10): 81 mg via GASTROSTOMY

## 2024-08-15 MED ORDER — FAMOTIDINE 20 MG PO TABS
20 | Freq: Every day | ORAL | Status: DC
Start: 2024-08-15 — End: 2024-08-28
  Administered 2024-08-16 – 2024-08-26 (×8): 20 mg via GASTROSTOMY

## 2024-08-15 MED ORDER — CEFAZOLIN SODIUM 1 G IJ SOLR
1 | Freq: Every day | INTRAMUSCULAR | Status: AC
Start: 2024-08-15 — End: 2024-08-26
  Administered 2024-08-15 – 2024-08-26 (×8): 1000 mg via INTRAVENOUS

## 2024-08-15 MED ORDER — CARVEDILOL 12.5 MG PO TABS
12.5 | Freq: Two times a day (BID) | ORAL | Status: DC
Start: 2024-08-15 — End: 2024-08-15
  Administered 2024-08-15: 22:00:00 12.5 mg via GASTROSTOMY

## 2024-08-15 MED ORDER — NORMAL SALINE FLUSH 0.9 % IV SOLN
0.9 | INTRAVENOUS | Status: DC | PRN
Start: 2024-08-15 — End: 2024-08-21

## 2024-08-15 MED ORDER — SENNA-DOCUSATE SODIUM 8.6-50 MG PO TABS
8.6-50 | Freq: Two times a day (BID) | ORAL | Status: DC
Start: 2024-08-15 — End: 2024-08-28
  Administered 2024-08-16 – 2024-08-27 (×19): 1 via GASTROSTOMY

## 2024-08-15 MED ORDER — DULOXETINE HCL 30 MG PO CPEP
30 | Freq: Every day | ORAL | Status: DC
Start: 2024-08-15 — End: 2024-08-28
  Administered 2024-08-16 – 2024-08-26 (×8): 60 mg via GASTROSTOMY

## 2024-08-15 MED ORDER — HYDRALAZINE HCL 20 MG/ML IJ SOLN
20 | Freq: Four times a day (QID) | INTRAMUSCULAR | Status: DC | PRN
Start: 2024-08-15 — End: 2024-08-15
  Administered 2024-08-15: 22:00:00 10 mg via INTRAVENOUS

## 2024-08-15 MED ORDER — NORMAL SALINE FLUSH 0.9 % IV SOLN
0.9 | Freq: Two times a day (BID) | INTRAVENOUS | Status: DC
Start: 2024-08-15 — End: 2024-08-21
  Administered 2024-08-16 – 2024-08-20 (×7): 10 mL via INTRAVENOUS

## 2024-08-15 MED ORDER — ATORVASTATIN CALCIUM 20 MG PO TABS
20 | Freq: Every evening | ORAL | Status: DC
Start: 2024-08-15 — End: 2024-08-28
  Administered 2024-08-16 – 2024-08-27 (×11): 80 mg via GASTROSTOMY

## 2024-08-15 MED ORDER — EPOETIN ALFA-EPBX 10000 UNIT/ML IJ SOLN
10000 | INTRAMUSCULAR | Status: DC
Start: 2024-08-15 — End: 2024-08-28

## 2024-08-15 MED ORDER — BUSPIRONE HCL 5 MG PO TABS
5 | Freq: Two times a day (BID) | ORAL | Status: DC
Start: 2024-08-15 — End: 2024-08-28
  Administered 2024-08-16 – 2024-08-27 (×19): 5 mg via GASTROSTOMY

## 2024-08-15 MED ORDER — HYDRALAZINE HCL 25 MG PO TABS
25 | Freq: Three times a day (TID) | ORAL | Status: DC
Start: 2024-08-15 — End: 2024-08-19
  Administered 2024-08-16 – 2024-08-19 (×6): 50 mg via GASTROSTOMY

## 2024-08-15 MED ORDER — VITAMIN B-6 50 MG PO TABS
50 | Freq: Four times a day (QID) | ORAL | Status: DC
Start: 2024-08-15 — End: 2024-08-28
  Administered 2024-08-16 – 2024-08-27 (×38): 25 mg via GASTROSTOMY

## 2024-08-15 MED FILL — VITAMIN B-6 50 MG PO TABS: 50 mg | ORAL | Qty: 1

## 2024-08-15 MED FILL — FAMOTIDINE 20 MG PO TABS: 20 mg | ORAL | Qty: 1

## 2024-08-15 MED FILL — DULOXETINE HCL 30 MG PO CPEP: 30 mg | ORAL | Qty: 2

## 2024-08-15 MED FILL — CARVEDILOL 12.5 MG PO TABS: 12.5 mg | ORAL | Qty: 1

## 2024-08-15 MED FILL — SEVELAMER CARBONATE 0.8 G PO PACK: 0.8 g | ORAL | Qty: 1

## 2024-08-15 MED FILL — NUTRILIPID 20 % IV EMUL: 20 % | INTRAVENOUS | Qty: 250

## 2024-08-15 MED FILL — HYDRALAZINE HCL 25 MG PO TABS: 25 mg | ORAL | Qty: 2

## 2024-08-15 MED FILL — OXYCODONE HCL 10 MG PO TABS: 10 mg | ORAL | Qty: 1

## 2024-08-15 MED FILL — SENNOSIDES-DOCUSATE SODIUM 8.6-50 MG PO TABS: 8.6-50 mg | ORAL | Qty: 1

## 2024-08-15 MED FILL — PANTOPRAZOLE SODIUM 40 MG PO TBEC: 40 mg | ORAL | Qty: 1

## 2024-08-15 MED FILL — AMLODIPINE BESYLATE 5 MG PO TABS: 5 mg | ORAL | Qty: 2

## 2024-08-15 MED FILL — HEPARIN SODIUM (PORCINE) 5000 UNIT/ML IJ SOLN: 5000 [IU]/mL | INTRAMUSCULAR | Qty: 1

## 2024-08-15 MED FILL — BUSPIRONE HCL 10 MG PO TABS: 10 mg | ORAL | Qty: 1

## 2024-08-15 MED FILL — SODIUM BICARBONATE 650 MG PO TABS: 650 mg | ORAL | Qty: 1

## 2024-08-15 MED FILL — BD POSIFLUSH 0.9 % IV SOLN: 0.9 % | INTRAVENOUS | Qty: 40

## 2024-08-15 MED FILL — MORPHINE SULFATE 4 MG/ML IJ SOLN: 4 mg/mL | INTRAMUSCULAR | Qty: 1

## 2024-08-15 MED FILL — ALBUMIN HUMAN 25 % IV SOLN: 25 % | INTRAVENOUS | Qty: 100

## 2024-08-15 MED FILL — SODIUM CHLORIDE 0.9 % IV SOLN: 0.9 % | INTRAVENOUS | Qty: 1000

## 2024-08-15 MED FILL — CLOPIDOGREL BISULFATE 75 MG PO TABS: 75 mg | ORAL | Qty: 1

## 2024-08-15 MED FILL — ATORVASTATIN CALCIUM 20 MG PO TABS: 20 mg | ORAL | Qty: 4

## 2024-08-15 MED FILL — HYDRALAZINE HCL 20 MG/ML IJ SOLN: 20 mg/mL | INTRAMUSCULAR | Qty: 1

## 2024-08-15 MED FILL — RETACRIT 10000 UNIT/ML IJ SOLN: 10000 [IU]/mL | INTRAMUSCULAR | Qty: 1

## 2024-08-15 MED FILL — PROCHLORPERAZINE EDISYLATE 10 MG/2ML IJ SOLN: 10 MG/2ML | INTRAMUSCULAR | Qty: 2

## 2024-08-15 MED FILL — PROMETHAZINE HCL 6.25 MG/5ML PO SOLN: 6.25 MG/5ML | ORAL | Qty: 10

## 2024-08-15 MED FILL — CEFAZOLIN SODIUM 1 G IJ SOLR: 1 g | INTRAMUSCULAR | Qty: 1000

## 2024-08-15 MED FILL — ASPIRIN 81 MG PO CHEW: 81 mg | ORAL | Qty: 1

## 2024-08-15 MED FILL — CLINIMIX E/DEXTROSE (4.25/5) 4.25 % IV SOLN: 4.25 % | INTRAVENOUS | Qty: 1000

## 2024-08-15 NOTE — Plan of Care (Signed)
 "  Problem: Chronic Conditions and Co-morbidities  Goal: Patient's chronic conditions and co-morbidity symptoms are monitored and maintained or improved  08/15/2024 1048 by Kavin Rolla Ill, LPN  Outcome: Progressing  08/15/2024 0628 by Curtistine Fireman, RN  Outcome: Not Progressing  08/15/2024 0627 by Curtistine Fireman, RN  Outcome: Progressing     Problem: Discharge Planning  Goal: Discharge to home or other facility with appropriate resources  08/15/2024 1048 by Kavin Rolla Ill, LPN  Outcome: Progressing  08/15/2024 0628 by Curtistine Fireman, RN  Outcome: Not Progressing  08/15/2024 0627 by Curtistine Fireman, RN  Outcome: Progressing     Problem: Pain  Goal: Verbalizes/displays adequate comfort level or baseline comfort level  08/15/2024 1048 by Kavin Rolla Ill, LPN  Outcome: Progressing  08/15/2024 0628 by Curtistine Fireman, RN  Outcome: Progressing  08/15/2024 0627 by Curtistine Fireman, RN  Outcome: Progressing     Problem: Safety - Adult  Goal: Free from fall injury  08/15/2024 1048 by Kavin Rolla Ill, LPN  Outcome: Progressing  08/15/2024 0628 by Curtistine Fireman, RN  Outcome: Not Progressing  08/15/2024 0627 by Curtistine Fireman, RN  Outcome: Progressing     Problem: Nutrition Deficit:  Goal: Optimize nutritional status  08/15/2024 1048 by Kavin Rolla Ill, LPN  Outcome: Progressing  08/15/2024 0628 by Curtistine Fireman, RN  Outcome: Not Progressing  08/15/2024 0627 by Curtistine Fireman, RN  Outcome: Progressing     Problem: Gastrointestinal - Adult  Goal: Minimal or absence of nausea and vomiting  08/15/2024 1048 by Kavin Rolla Ill, LPN  Outcome: Progressing  08/15/2024 0628 by Curtistine Fireman, RN  Outcome: Not Progressing  08/15/2024 0627 by Curtistine Fireman, RN  Outcome: Not Progressing  Goal: Maintains or returns to baseline bowel function  08/15/2024 1048 by Kavin Rolla Ill, LPN  Outcome: Progressing  08/15/2024 0628 by Curtistine Fireman, RN  Outcome: Not  Progressing  08/15/2024 0627 by Curtistine Fireman, RN  Outcome: Progressing  Goal: Maintains adequate nutritional intake  08/15/2024 1048 by Kavin Rolla Ill, LPN  Outcome: Progressing  08/15/2024 0628 by Curtistine Fireman, RN  Outcome: Not Progressing  08/15/2024 0627 by Curtistine Fireman, RN  Outcome: Progressing     Problem: Skin/Tissue Integrity  Goal: Skin integrity remains intact  Description: 1.  Monitor for areas of redness and/or skin breakdown  2.  Assess vascular access sites hourly  3.  Every 4-6 hours minimum:  Change oxygen saturation probe site  4.  Every 4-6 hours:  If on nasal continuous positive airway pressure, respiratory therapy assess nares and determine need for appliance change or resting period  08/15/2024 1048 by Kavin Rolla Ill, LPN  Outcome: Progressing  08/15/2024 0628 by Curtistine Fireman, RN  Outcome: Not Progressing  08/15/2024 0627 by Curtistine Fireman, RN  Outcome: Progressing     Problem: Chronic Conditions and Co-morbidities  Goal: Patient's chronic conditions and co-morbidity symptoms are monitored and maintained or improved  08/15/2024 1048 by Kavin Rolla Ill, LPN  Outcome: Progressing  08/15/2024 0628 by Curtistine Fireman, RN  Outcome: Not Progressing  08/15/2024 0627 by Curtistine Fireman, RN  Outcome: Progressing     Problem: Discharge Planning  Goal: Discharge to home or other facility with appropriate resources  08/15/2024 1048 by Kavin Rolla Ill, LPN  Outcome: Progressing  08/15/2024 0628 by Curtistine Fireman, RN  Outcome: Not Progressing  08/15/2024 0627 by Curtistine Fireman, RN  Outcome: Progressing     Problem: Safety - Adult  Goal: Free from fall injury  08/15/2024 1048 by Kavin Rolla  Ocie, LPN  Outcome: Progressing  08/15/2024 0628 by Curtistine Fireman, RN  Outcome: Not Progressing  08/15/2024 0627 by Curtistine Fireman, RN  Outcome: Progressing     Problem: Nutrition Deficit:  Goal: Optimize nutritional status  08/15/2024 1048 by Kavin Rolla Ocie, LPN  Outcome: Progressing  08/15/2024 0628 by Curtistine Fireman, RN  Outcome: Not Progressing  08/15/2024 0627 by Curtistine Fireman, RN  Outcome: Progressing     Problem: Gastrointestinal - Adult  Goal: Minimal or absence of nausea and vomiting  08/15/2024 1048 by Kavin Rolla Ocie, LPN  Outcome: Progressing  08/15/2024 0628 by Curtistine Fireman, RN  Outcome: Not Progressing  08/15/2024 0627 by Curtistine Fireman, RN  Outcome: Not Progressing  Goal: Maintains or returns to baseline bowel function  08/15/2024 1048 by Kavin Rolla Ocie, LPN  Outcome: Progressing  08/15/2024 0628 by Curtistine Fireman, RN  Outcome: Not Progressing  08/15/2024 0627 by Curtistine Fireman, RN  Outcome: Progressing  Goal: Maintains adequate nutritional intake  08/15/2024 1048 by Kavin Rolla Ocie, LPN  Outcome: Progressing  08/15/2024 0628 by Curtistine Fireman, RN  Outcome: Not Progressing  08/15/2024 0627 by Curtistine Fireman, RN  Outcome: Progressing     Problem: Skin/Tissue Integrity  Goal: Skin integrity remains intact  Description: 1.  Monitor for areas of redness and/or skin breakdown  2.  Assess vascular access sites hourly  3.  Every 4-6 hours minimum:  Change oxygen saturation probe site  4.  Every 4-6 hours:  If on nasal continuous positive airway pressure, respiratory therapy assess nares and determine need for appliance change or resting period  08/15/2024 1048 by Kavin Rolla Ocie, LPN  Outcome: Progressing  08/15/2024 0628 by Curtistine Fireman, RN  Outcome: Not Progressing  08/15/2024 0627 by Curtistine Fireman, RN  Outcome: Progressing     "

## 2024-08-15 NOTE — Plan of Care (Signed)
"    Problem: Chronic Conditions and Co-morbidities  Goal: Patient's chronic conditions and co-morbidity symptoms are monitored and maintained or improved  08/15/2024 0628 by Curtistine Fireman, RN  Outcome: Not Progressing  08/15/2024 0627 by Curtistine Fireman, RN  Outcome: Progressing     Problem: Discharge Planning  Goal: Discharge to home or other facility with appropriate resources  08/15/2024 0628 by Curtistine Fireman, RN  Outcome: Not Progressing  08/15/2024 0627 by Curtistine Fireman, RN  Outcome: Progressing     Problem: Safety - Adult  Goal: Free from fall injury  08/15/2024 0628 by Curtistine Fireman, RN  Outcome: Not Progressing  08/15/2024 0627 by Curtistine Fireman, RN  Outcome: Progressing     Problem: Nutrition Deficit:  Goal: Optimize nutritional status  08/15/2024 0628 by Curtistine Fireman, RN  Outcome: Not Progressing  08/15/2024 0627 by Curtistine Fireman, RN  Outcome: Progressing     Problem: Gastrointestinal - Adult  Goal: Minimal or absence of nausea and vomiting  08/15/2024 0628 by Curtistine Fireman, RN  Outcome: Not Progressing  08/15/2024 0627 by Curtistine Fireman, RN  Outcome: Not Progressing  Goal: Maintains or returns to baseline bowel function  08/15/2024 0628 by Curtistine Fireman, RN  Outcome: Not Progressing  08/15/2024 0627 by Curtistine Fireman, RN  Outcome: Progressing  Goal: Maintains adequate nutritional intake  08/15/2024 0628 by Curtistine Fireman, RN  Outcome: Not Progressing  08/15/2024 0627 by Curtistine Fireman, RN  Outcome: Progressing     Problem: Skin/Tissue Integrity  Goal: Skin integrity remains intact  Description: 1.  Monitor for areas of redness and/or skin breakdown  2.  Assess vascular access sites hourly  3.  Every 4-6 hours minimum:  Change oxygen saturation probe site  4.  Every 4-6 hours:  If on nasal continuous positive airway pressure, respiratory therapy assess nares and determine need for appliance change or resting period  08/15/2024 0628 by Curtistine Fireman, RN  Outcome: Not  Progressing  08/15/2024 0627 by Curtistine Fireman, RN  Outcome: Progressing     "

## 2024-08-15 NOTE — Plan of Care (Signed)
 "  Problem: Chronic Conditions and Co-morbidities  Goal: Patient's chronic conditions and co-morbidity symptoms are monitored and maintained or improved  08/15/2024 2103 by Annis Geni HERO, RN  Outcome: Progressing  Flowsheets (Taken 08/15/2024 2000)  Care Plan - Patient's Chronic Conditions and Co-Morbidity Symptoms are Monitored and Maintained or Improved: Monitor and assess patient's chronic conditions and comorbid symptoms for stability, deterioration, or improvement  08/15/2024 1048 by Kavin Rolla Ill, LPN  Outcome: Progressing     Problem: Discharge Planning  Goal: Discharge to home or other facility with appropriate resources  08/15/2024 2103 by Annis Geni HERO, RN  Outcome: Progressing  Flowsheets (Taken 08/15/2024 2000)  Discharge to home or other facility with appropriate resources: Identify barriers to discharge with patient and caregiver  08/15/2024 1048 by Kavin Rolla Ill, LPN  Outcome: Progressing     Problem: Pain  Goal: Verbalizes/displays adequate comfort level or baseline comfort level  08/15/2024 2103 by Annis Geni HERO, RN  Outcome: Progressing  Flowsheets (Taken 08/15/2024 2034)  Verbalizes/displays adequate comfort level or baseline comfort level:   Encourage patient to monitor pain and request assistance   Assess pain using appropriate pain scale  08/15/2024 1048 by Kavin Rolla Ill, LPN  Outcome: Progressing     Problem: Safety - Adult  Goal: Free from fall injury  08/15/2024 2103 by Annis Geni HERO, RN  Outcome: Progressing  08/15/2024 1048 by Kavin Rolla Ill, LPN  Outcome: Progressing     Problem: Nutrition Deficit:  Goal: Optimize nutritional status  08/15/2024 2103 by Annis Geni HERO, RN  Outcome: Progressing  08/15/2024 1048 by Kavin Rolla Ill, LPN  Outcome: Progressing     Problem: Gastrointestinal - Adult  Goal: Minimal or absence of nausea and vomiting  08/15/2024 2103 by Annis Geni HERO, RN  Outcome: Progressing  Flowsheets (Taken 08/15/2024  2000)  Minimal or absence of nausea and vomiting: Administer IV fluids as ordered to ensure adequate hydration  08/15/2024 1048 by Kavin Rolla Ill, LPN  Outcome: Progressing  Goal: Maintains or returns to baseline bowel function  08/15/2024 2103 by Annis Geni HERO, RN  Outcome: Progressing  Flowsheets (Taken 08/15/2024 2000)  Maintains or returns to baseline bowel function: Assess bowel function  08/15/2024 1048 by Kavin Rolla Ill, LPN  Outcome: Progressing  Goal: Maintains adequate nutritional intake  08/15/2024 2103 by Annis Geni HERO, RN  Outcome: Progressing  Flowsheets (Taken 08/15/2024 2000)  Maintains adequate nutritional intake: Monitor percentage of each meal consumed  08/15/2024 1048 by Kavin Rolla Ill, LPN  Outcome: Progressing     Problem: Gastrointestinal - Adult  Goal: Maintains or returns to baseline bowel function  08/15/2024 2103 by Annis Geni HERO, RN  Outcome: Progressing  Flowsheets (Taken 08/15/2024 2000)  Maintains or returns to baseline bowel function: Assess bowel function  08/15/2024 1048 by Kavin Rolla Ill, LPN  Outcome: Progressing     Problem: Skin/Tissue Integrity  Goal: Skin integrity remains intact  Description: 1.  Monitor for areas of redness and/or skin breakdown  2.  Assess vascular access sites hourly  3.  Every 4-6 hours minimum:  Change oxygen saturation probe site  4.  Every 4-6 hours:  If on nasal continuous positive airway pressure, respiratory therapy assess nares and determine need for appliance change or resting period  08/15/2024 2103 by Annis Geni HERO, RN  Outcome: Progressing  Flowsheets (Taken 08/15/2024 2000)  Skin Integrity Remains Intact: Monitor for areas of redness and/or skin breakdown  08/15/2024 1048 by Kavin Rolla Ill, LPN  Outcome: Progressing     "

## 2024-08-15 NOTE — Progress Notes (Signed)
"  Nephrologist did not approve  PICC line placement on right upper extremity. MD notified of nephrologist's decision.   "

## 2024-08-15 NOTE — Dialysis (Signed)
"    Hepatitis B Surface Ag   Date/Time Value Ref Range Status   08/07/2024 01:38 AM NONREACTIVE NR   Final     Hep B S Ag Interp   Date/Time Value Ref Range Status   03/16/2023 08:19 AM Negative NEG   Final     Hep B S Ab   Date/Time Value Ref Range Status   08/07/2024 01:38 AM <3.50 mIU/mL Final     Comment:        <10.00 mIU/mL     Non-Immune  =>10.00 mIU/mL    Individual is considered to be immune to infection with HBV       Hep B results, dates, and Primary Source: 08/07/24 Negative/Susceptible/Epic  Hep B dual verification performed for (RN): oncoming [facility] nurse  Machine disinfect process:standard    "

## 2024-08-15 NOTE — Progress Notes (Addendum)
"  Called for assistance with PIV on this patient with hemodialysis access in left arm and very limited vascular access right arm. The right forearm is not showing any potential appropriate vessels for cannulation at this time. Noted presence of fluid subcutaneously.   A # 22 gauge Nexiva IV catheter inserted right posterior dorsal aspect of hand with positive blood return, flushes with ease. Secured with Tegaderm.    Patient is receiving Compazine  IV which has a pH of 4.2-6.2. I would recommend further diluting the compazine  as it is an acidic solution.   "

## 2024-08-15 NOTE — Progress Notes (Signed)
 "Comprehensive Nutrition Assessment    Type and Reason for Visit: Reassess    Nutrition Recommendations/Plan:     Recommend ordering PPN until pt able to resume and tolerate EN        PPN Rec's:         - Day 1: D5, 4.25% AA at 42 ml/hr + 250 ml 20% lipids 3x/week         - Day 2: If lytes WNL, increase to 63 ml/hr          - Day 3: If lytes WNL, increase to goal rate of 83 ml/hr + 250 ml 20% lipids 3x/week    2.   Cyclic Nocturnal TF via J-port when ready to resume TF:          - Nepro at 40 ml/hr x 10 hours (8pm-6am)         - 1 ProSource daily (Flush with 15 ml h20 before and after)         - 30 ml h20 flush q4h around the clock.       Malnutrition Assessment:  Malnutrition Status:  Insufficient data (08/07/24 1316)    Context:  Chronic Illness     Findings of the 6 clinical characteristics of malnutrition:  Energy Intake:  Mild decrease in energy intake  Weight Loss:  Mild weight loss     Body Fat Loss:  Unable to assess     Muscle Mass Loss:  Unable to assess    Fluid Accumulation:  No fluid accumulation    Grip Strength:  Not Performed     Nutrition Assessment:    43 yo female admitted for HTN emergency, N/V. Pmhx: DM, Gastroparesis, HTN, ESRD - on HD, GJ tube.      11/5:  Follow up.  TF ran over night 11/3 as Nepro at 30 ml/hr x 10 hours which she tolerated with no complaints.  Last night we increased the rate to 40 ml/hr and she had n/v.  NPO now.  GI to see.  Spoke with daughter who states feeding was administered through J-port.  Will need to start PN until pt able to tolerate PO/TF.    PPN rec's:  goal rate of D5, 4.25% AA at 83 ml/hr + 250 ml 20% lipids MWF (TG level of 352).    This will provide 890 kcals (65% kcal needs), 85 gm protein (100% protein needs)    11/3: Follow up.  Pt remains NPO.  Have reached out to MD regarding need to start nutrition support, J-tube feeds vs PPN.  Still having N/V.  No new weight. TF rec's below in bold.      10/30:  Follow up.  Pt remains NPO.  Will recommend starting  TF via J port.  Still having N/V.  No new weight.      10/28: MD consult received for TF rec's. Chart review indicate G-J tube placed in August 2025 and started on cyclic TF.  Spoke with pt's daughter at bedside.  She reports pt eats by mouth at home.  Some days she has n/v and is not able to tolerate PO so she gives herself 3 bolus feeds of NovaSource Renal 2.0 3x/day.  Pt and daughter unsure how much she gives herself, 2 large syringes full of feed.  On days when she eats and tolerates PO then she does not use feeding tube or might do one feed at night.  When I asked what foods she eats by mouth, she told  me soups, cooked vegetables, and fruits.  Discussed low fiber diet necessary for gastroparesis, recommended all vegetables be cooked very soft, no beans in her soups, and fruits be canned.  Daughter states she has been losing weight, unable to quantify.  Weight hx in EMR indicates 6% loss over last 5 months which is not significant for time frame.  Weight seems to fluctuate between 115-135 lbs over last 1.5 years.       Pt currently NPO, MD wants to hold off on starting TF.  Pt was actively vomiting during my visit.  Did not complete NFPE because of this.      TF rec's when ready to start TF:  Nepro at 40 ml/hr x 10 hours + 1 ProSource daily + 30 ml h20 flush q4h around the clock.  This will provide 400 ml, 800 kcals (59% kcal needs), 52 gm protein (74% protein needs), 64 gm CHO, 290 + 30 + 180 = 500 ml water           Nutritionally Significant Medications:  NaCl at 100 ml/hr; Epo, Renvela , Vit B6    Estimated Daily Nutrient Needs:  Energy Requirements Based On: Kcal/kg  Weight Used for Energy Requirements: Current  Energy (kcal/day): 1365-1640 (25-30 kcals/kg)  Weight Used for Protein Requirements: Current  Protein (g/day): 70 (1.3 gm/kg)  Method Used for Fluid Requirements: 1 ml/kcal  Fluid (ml/day): 1365    Nutrition Related Findings:   Edema: Generalized      RUE Edema: Trace           Recent Labs      08/13/24  0327 08/14/24  0748 08/15/24  0403   GLUCOSE 113* 126* 158*   BUN 24* 31* 16   CREATININE 5.41* 6.49* 4.57*   NA 134* 134* 138   K 4.0 4.4 3.8   CL 98 95* 97*   CO2 24 21 24    CALCIUM  8.0* 8.2* 8.1*   PHOS  --  5.3* 4.0   MG  --  2.0 1.9     Recent Labs     08/12/24  1620 08/12/24  1954 08/13/24  0818 08/13/24  1107 08/13/24  1721 08/13/24  2121 08/14/24  0609 08/15/24  0611   POCGLU 155* 155* 122* 136* 134* 115 141* 147*     Lab Results   Component Value Date/Time    LABA1C 6.1 06/27/2024 05:10 AM    LABA1C 7.0 02/19/2024 05:02 AM    LABA1C 6.8 10/24/2023 03:52 AM    EAG 127 06/27/2024 05:10 AM     Triglycerides   Date Value Ref Range Status   08/14/2024 352 (H) 0 - 150 MG/DL Final     Comment:     Borderline High: 150-199 mg/dL, High: 799-500 mg/dL  Very High: Greater than or equal to 500 mg/dL     89/71/7974 828 (H) 0 - 150 MG/DL Final     Comment:     Borderline High: 150-199 mg/dL, High: 799-500 mg/dL  Very High: Greater than or equal to 500 mg/dL     98/98/7974 844 (H) <150 MG/DL Final     Comment:     Based on NCEP-ATP III:  Triglycerides <150 mg/dL  is considered normal, 150-199 mg/dL  borderline high,  799-500 mg/dL high and  greater than or equal to 500 mg/dL very high.       Last BM: 08/13/24    Wounds: Wound Type: None    Current Nutrition Therapies:  Diet: NPO  Supplements: none  Meal Intake:   No data found.  Supplement Intake:  No data found.  Nutrition Support: none    Anthropometric Measures:  Height: 149.9 cm (4' 11.02)  Ideal Body Weight (IBW): 95 lbs (43 kg)       Current Body Weight: 54.6 kg (120 lb 5.9 oz), 126.7 % IBW. Weight Source: Bed scale  Current BMI (kg/m2): 24.3        Weight Adjustment For: No Adjustment                 BMI Categories: Normal Weight (BMI 18.5-24.9)    Wt Readings from Last 10 Encounters:   08/06/24 54.6 kg (120 lb 5.9 oz)   07/20/24 57.2 kg (126 lb 1.7 oz)   07/11/24 54.6 kg (120 lb 5.9 oz)   07/09/24 55.5 kg (122 lb 5.7 oz)   03/06/24 57.6 kg (127 lb)    02/21/24 58 kg (127 lb 13.9 oz)   11/28/23 52.2 kg (115 lb 1.3 oz)   10/31/23 52.6 kg (115 lb 15.4 oz)   10/21/23 55.2 kg (121 lb 11.1 oz)   10/12/23 60.4 kg (133 lb 3.2 oz)       Nutrition Diagnosis:   Inadequate protein-energy intake related to nausea/vomiting/diarrhea as evidenced by poor intake prior to admission    Nutrition Interventions:   Food and/or Nutrient Delivery: Start Tube Feeding, Start Parenteral Nutrition  Nutrition Education/Counseling: Education/Counseling initiated  Coordination of Nutrition Care: Continue to monitor while inpatient       Goals:  Previous Goal Met: Progress towards Goal(s) Declining  Goals: Initiate nutrition support, Tolerate nutrition support at goal rate, by next RD assessment       Nutrition Monitoring and Evaluation:   Behavioral-Environmental Outcomes: None Identified  Food/Nutrient Intake Outcomes: Enteral Nutrition Intake/Tolerance, Parenteral Nutrition Intake/Tolerance  Physical Signs/Symptoms Outcomes: Biochemical Data, Nausea or Vomiting, Weight    Discharge Planning:    Enteral Nutrition     Adella Acron, RD  Available via [employer]    "

## 2024-08-15 NOTE — Care Coordination (Signed)
"  Care Management Progress Note        Reason for Admission:   Hypertensive emergency [I16.1]  Intractable nausea and vomiting [R11.2]         Patient Admission Status: Inpatient  RUR: 43%  Hospitalization in the last 30 days (Readmission):  No        Transition of care plan:  Patient was discussed in IDR and continues to be medically managed. Final ID recommendations have been received. GI has been consulted.     Dispo: return home with family.    HD: CM has faxed clinical updates, HD flowsheet, and ID recommendations to [facility] at [phone].    Discharge plan communicated with patient and/or discharge caregiver: Yes      Date 1st IMM letter given: N/a. Patient has a comptroller.     Outpatient follow-up.    Transport at discharge: family.        ___________________________________________   Joan Pond, RN Case Manager  08/15/2024   2:53 PM         "

## 2024-08-15 NOTE — Progress Notes (Signed)
 "Infectious Disease Consult    Impression/Plan   MSSA  bacteremia.  Low suspicion however cannot rule out AV graft infected hematoma.  Vascular surgery following.  No intervention planned.  Will plan for extended course of cefazolin  for possible endovascular infection.  Antibiotics may be completed on HD.  See antibiotic orders below.  .  Alpha Streptococcus isolated from urine.  Likely colonization.  This will be covered by antibiotics above  Leukocytosis.  Resolved..  Back pain.  Chronic.  No evidence of discitis on MRI of cervical, thoracic, or lumbar spine  ESRD-HD MWF via left AV fistula  Diabetes mellitus.  Hemoglobin A1c 6.1  Severe gastroparesis.  Patient has PEG/J-tube.  GI following    Okay for discharge on cefazolin  2 g dosed post HD Mondays and Wednesdays and 3 g dosed post HD on Fridays.  Antibiotic to be continued through 09/19/2024    Will discuss with nephrology     Anti-infectives:   Cefazolin    Subjective:   Date of Consultation:  August 15, 2024  Date of Admission: 08/06/2024   Referring Physician:     Patient is a 43 y.o. female with a past medical history significant for end-stage renal disease on dialysis, hypertension, and gastroparesis who is being seen for bacteremia.    Patient was in her usual state of health until the day prior to admission when she developed severe diffuse abdominal pain associated with nausea.  The patient missed dialysis session due to vomiting.    Patient was admitted to [facility] for evaluation of nausea and vomiting.  Patient has a PEG/J-tube tube which she uses for medications.     Blood cultures drawn at the time of admission have returned growing Staphylococcus aureus.  This appears to be MSSA based on PCR testing.  She is currently on vancomycin  and ceftriaxone .  The infectious diseases service has been asked to assist with antibiotic management    08/13/2024.  Patient complaining of nausea today    08/15/2024.  Patient feeling better.  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)    Intractable abdominal pain    Intractable vomiting    Gastroparesis    ESRD (end stage renal disease) on dialysis (HCC)    Acute hyperkalemia    ESRD (end stage renal disease) (HCC)    Type 2 diabetes mellitus with chronic kidney disease on chronic dialysis, with long-term current use of insulin  (HCC)    Hypertensive emergency    MSSA bacteremia    End-stage renal disease on hemodialysis (HCC)     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   Social History     Tobacco Use    Smoking status: Never    Smokeless tobacco: Never  Substance Use Topics    Alcohol use: Never     Past Surgical History:   Procedure Laterality Date    CAPSULE ENDOSCOPY N/A 01/20/2023    ESOPHAGEAL CAPSULE ENDOSCOPY remove at 1624PM performed by Madlyn Fendt, MD at [facility] ENDOSCOPY    CHOLECYSTECTOMY, LAPAROSCOPIC N/A 02/03/2023    ROBOTIC LAPAROSCOPIC CHOLECYSTECTOMY with Indocyanine green  performed by Chrystal Elijah BRAVO, MD at [facility] MAIN OR    COLONOSCOPY N/A 01/19/2023    COLONOSCOPY DIAGNOSTIC performed by Madlyn Fendt, MD at [facility] ENDOSCOPY    INVASIVE VASCULAR N/A 10/03/2023    Angiography visceral SMA performed by Millard Oneil LABOR, MD at [facility] CARDIAC CATH LAB    INVASIVE VASCULAR N/A 10/03/2023    Ultrasound guided vascular access performed by Millard Oneil LABOR, MD at [facility] CARDIAC CATH LAB    INVASIVE VASCULAR N/A 10/03/2023    Insert stent peripheral artery performed by Millard Oneil LABOR, MD at [facility] CARDIAC CATH LAB    IR NONTUNNELED VASCULAR CATHETER > 5 YEARS  10/10/2023    IR NONTUNNELED VASCULAR CATHETER > 5 YEARS 10/10/2023 [facility] CARDIAC CATH/EP/IR LAB    IR NONTUNNELED VASCULAR CATHETER > 5 YEARS  10/10/2023    IR NONTUNNELED VASCULAR CATHETER > 5 YEARS 10/10/2023 [facility] CARDIAC CATH/EP/IR LAB    OTHER SURGICAL HISTORY Left     Rentia attachment    TUBAL LIGATION Bilateral     UPPER GASTROINTESTINAL ENDOSCOPY N/A 01/17/2023    ESOPHAGOGASTRODUODENOSCOPY performed by Madlyn Fendt, MD at [facility] ENDOSCOPY    UPPER GASTROINTESTINAL ENDOSCOPY N/A 01/17/2023    ESOPHAGOGASTRODUODENOSCOPY BIOPSY performed by Madlyn Fendt, MD at [facility] ENDOSCOPY    UPPER GASTROINTESTINAL ENDOSCOPY N/A 01/18/2023    ESOPHAGOGASTRODUODENOSCOPY performed by Madlyn Fendt, MD at [facility] ENDOSCOPY    UPPER GASTROINTESTINAL ENDOSCOPY N/A 05/13/2023    ESOPHAGOGASTRODUODENOSCOPY performed by Roseann Lonni BIRCH, MD at [facility] ENDOSCOPY    UPPER GASTROINTESTINAL ENDOSCOPY N/A 05/13/2023    ESOPHAGOGASTRODUODENOSCOPY BIOPSY performed by Roseann Lonni BIRCH, MD at [facility] ENDOSCOPY    UPPER GASTROINTESTINAL ENDOSCOPY N/A 09/21/2023    ESOPHAGOGASTRODUODENOSCOPY performed by Madlyn Fendt, MD at [facility] ENDOSCOPY    UPPER GASTROINTESTINAL ENDOSCOPY N/A 09/21/2023    ESOPHAGOGASTRODUODENOSCOPY BIOPSY performed by Madlyn Fendt, MD at [facility] ENDOSCOPY    US  FLUID COLLECTION DRAINAGE PERITONEAL/RETROPERITONEAL [facility]  02/11/2023    US  ABSCESS DRAINAGE PERITONEAL 02/11/2023 [facility] RAD US       Prior to Admission medications   Medication Sig Start Date End Date Taking? Authorizing Provider   metoclopramide  (REGLAN ) 5 MG tablet Take 1 tablet by mouth 3 times daily as needed (nausea/vomiting) 07/20/24 07/25/24  Cutchins, Abigail M, PA-C   lidocaine  4 % external patch Place 1 patch onto the skin daily 07/02/24   Karenann Barter, MD   sevelamer  (RENVELA ) 0.8 g PACK packet Take 1 packet by mouth 3 times daily (with meals) 07/02/24   Karenann Barter, MD   atorvastatin  (LIPITOR ) 80  MG tablet Take 1 tablet by mouth nightly    [provider]   carvedilol  (COREG ) 12.5 MG tablet Take 1 tablet by mouth 2 times daily (with meals)    [provider]   famotidine  (PEPCID ) 20 MG tablet Take 1 tablet by mouth 2 times daily    [provider]   hydrALAZINE  (APRESOLINE ) 50 MG tablet Take 1 tablet by mouth 3 times daily    [provider]   sodium bicarbonate  650 MG tablet Take 1 tablet by mouth 3 times daily 02/23/24   Sueellen Shaver  Celestia, MD   pantoprazole  (PROTONIX ) 40 MG tablet Take 1 tablet by mouth 2 times daily (before meals) 02/23/24   Muncy, Manuelita Celestia, MD   sennosides-docusate sodium  (SENOKOT-S) 8.6-50 MG tablet Take 1 tablet by mouth in the morning and at bedtime 11/06/23   Do, Khoi B, MD   ondansetron  (ZOFRAN -ODT) 4 MG disintegrating tablet Take 1 tablet by mouth every 8 hours as needed for Nausea or Vomiting 11/06/23   Do, Rodrick NOVAK, MD   prochlorperazine  (COMPAZINE ) 10 MG tablet Take 1 tablet by mouth every 6 hours as needed (nausea or vomiting) 10/25/23   Orson Planas, MD   metFORMIN  (GLUCOPHAGE -XR) 500 MG extended release tablet Take 2 tablets by mouth daily (with breakfast)  Patient not taking: Reported on 06/26/2024 10/25/23   Aktig, Ziya, MD   busPIRone  (BUSPAR ) 15 MG tablet Take 15 mg by mouth 3 times daily  Patient taking differently: Take 5 mg by mouth in the morning and at bedtime 10/14/23   Tefera, Mesfin A, MD   DULoxetine  (CYMBALTA ) 60 MG extended release capsule Take 1 capsule by mouth daily 10/15/23   Tefera, Mesfin A, MD   clopidogrel  (PLAVIX ) 75 MG tablet Take 1 tablet by mouth daily 10/15/23   Tefera, Mesfin A, MD   aspirin  81 MG chewable tablet Take 1 tablet by mouth daily 10/15/23   Tefera, Mesfin A, MD   amLODIPine  (NORVASC ) 10 MG tablet Take 1 tablet by mouth daily 03/23/23   Do, Khoi B, MD     No Known Allergies     Review of Systems:  Pertinent items are noted in the History of Present Illness.    Objective:   Blood pressure 133/69, pulse 94,  temperature 98.8 F (37.1 C), temperature source Oral, resp. rate 18, height 1.499 m (4' 11.02), weight 54.6 kg (120 lb 5.9 oz), SpO2 96%.  Temp (24hrs), Avg:98.2 F (36.8 C), Min:97.6 F (36.4 C), Max:98.8 F (37.1 C)       Exam:    General:  Moderate distress due to nausea   Eyes:  Sclera anicteric.   Mouth/Throat: Mucous membranes normal   Neck: Supple   Lungs:   No distress   CV:     Abdomen:   G-tube in place   Extremities: Moves all.   Skin: No acute rash on exposed skin   Lymph nodes:    Musculoskeletal:    Lines/Devices:     Psych:        Data Review:   Recent Results (from the past 24 hours)   CBC with Auto Differential    Collection Time: 08/15/24  4:03 AM   Result Value Ref Range    WBC 6.3 3.6 - 11.0 K/uL    RBC 3.51 (L) 3.80 - 5.20 M/uL    Hemoglobin 10.5 (L) 11.5 - 16.0 g/dL    Hematocrit 66.5 (L) 35.0 - 47.0 %    MCV 95.2 80.0 - 99.0 FL    MCH 29.9 26.0 - 34.0 PG    MCHC 31.4 30.0 - 36.5 g/dL    RDW 85.5 88.4 - 85.4 %    Platelets 383 150 - 400 K/uL    MPV 8.9 8.9 - 12.9 FL    Nucleated RBCs 1.3 (H) 0 PER 100 WBC    nRBC 0.08 (H) 0.00 - 0.01 K/uL    Neutrophils % 61.1 32.0 - 75.0 %    Lymphocytes % 19.8 12.0 - 49.0 %    Monocytes % 11.3 5.0 - 13.0 %  Eosinophils % 5.2 0.0 - 7.0 %    Basophils % 1.0 0.0 - 1.0 %    Immature Granulocytes % 1.6 (H) 0.0 - 0.5 %    Neutrophils Absolute 3.85 1.80 - 8.00 K/UL    Lymphocytes Absolute 1.25 0.80 - 3.50 K/UL    Monocytes Absolute 0.71 0.00 - 1.00 K/UL    Eosinophils Absolute 0.33 0.00 - 0.40 K/UL    Basophils Absolute 0.06 0.00 - 0.10 K/UL    Immature Granulocytes Absolute 0.10 (H) 0.00 - 0.04 K/UL    Differential Type AUTOMATED     Comprehensive Metabolic Panel    Collection Time: 08/15/24  4:03 AM   Result Value Ref Range    Sodium 138 136 - 145 mmol/L    Potassium 3.8 3.5 - 5.1 mmol/L    Chloride 97 (L) 98 - 107 mmol/L    CO2 24 20 - 29 mmol/L    Anion Gap 17 (H) 2 - 14 mmol/L    Glucose 158 (H) 65 - 100 mg/dL    BUN 16 6 - 20 MG/DL    Creatinine 5.42  (H) 0.60 - 1.00 MG/DL    BUN/Creatinine Ratio 4 (L) 12 - 20      Est, Glom Filt Rate 12 (L) >59 ml/min/1.24m2    Calcium  8.1 (L) 8.6 - 10.0 MG/DL    Total Bilirubin <9.7 0.0 - 1.2 MG/DL    ALT <5 (L) 10 - 35 U/L    AST 20 10 - 35 U/L    Alk Phosphatase 141 (H) 35 - 104 U/L    Total Protein 5.4 (L) 6.4 - 8.3 g/dL    Albumin  2.5 (L) 3.5 - 5.2 g/dL    Globulin 2.9 2.0 - 4.0 g/dL    Albumin /Globulin Ratio 0.9 (L) 1.1 - 2.2     Magnesium     Collection Time: 08/15/24  4:03 AM   Result Value Ref Range    Magnesium  1.9 1.6 - 2.6 mg/dL   Phosphorus    Collection Time: 08/15/24  4:03 AM   Result Value Ref Range    Phosphorus 4.0 2.5 - 4.5 MG/DL   POCT Glucose    Collection Time: 08/15/24  6:11 AM   Result Value Ref Range    POC Glucose 147 (H) 65 - 117 mg/dL    Performed by: Benedetta Maiden         Microbiology:      Studies:      A total time of 35 minutes was spent on today's encounter.  Greater than 50% of the time was spent on the following:  Preparing for visit and chart review.  Obtaining and/or reviewing separately obtained history  Performing a medically appropriate exam and/or evaluation  Counseling and educating a patient/family/caregiver as noted above  Placing relevant orders  Referring and communicating with other professionals (not separately reported)  Independently interpreting results (not separately reported) and communicating results to the patient/family/caregiver  Care coordination (not separately reported) as noted above  Documenting clinical information in the electronic health records (e.g. problem list, visit note) on the day of the encounter     Signed By: Oneil Spencer, DO     August 15, 2024         "

## 2024-08-15 NOTE — Progress Notes (Addendum)
 "    Hospitalist Progress Note      NAME: Melinda [name]   DOB:  06/29/1981  MRM:  [id number]    Date/Time: 08/15/2024  12:42 PM           Assessment / Plan:   Sepsis secondary to Staph aureus bacteremia.  Sepsis secondary to UTI  Echo without vegetation.  MRI spine with degenerative changes no signs of infection.  CT graft of left arm concerning for mild infection versus old hematoma per vascular okay to use graft.  Seen by vascular while inpatient  -ID on board appreciate recs  - Cefazolin  2 g on HD days through 09/19/2024    Gastroparesis.    S/p GJ tube- Required GJ tube placement for adequate nutrition.  POA initially presented with abdominal pain nausea vomiting now has resolved.  CT abdomen pelvis without acute process.  Does have GJ tube  Today with worsening vomiting  -Holding tube feeds which started on 08/13/2024,  - Will work on uptitrating versus watching per GI and dietitian  - Starting PPN today    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  POA  - Phosphorus downtrending to normal continue sevelamer     Prolonged QTc POA 502  - Avoid Zofran  continue Compazine     Fentanyl  positive: Seen on drug screen.  May be a false positive in the setting of Benadryl  use.  Patient adamantly denies use.     Hypertension POA. Improved  Will continue patient's home Coreg /hydralazine /amlodipine   Lopressor  PRN for SBP >180     ESRD  On HD , extra session today to get her back on track.  Nephrology following     GERD: Chronic.    Continue Pepcid  and pantop     T2DM with gastroparesis. POA, chronic.  Last A1c of 6.1 on 9/17.    -Sliding scale insulin .      Chronic pain or anxiety/depression  Duloxetine  and BuSpar .     DAPT: Unclear why patient is on dual antiplatelet therapy.  Will continue for now.  May need to be titrated off outpatient     Anemia: Trend.  Follow-up outpatient      Social determinants of health: complicating care   Social Drivers of Health     Tobacco Use: Low Risk  (07/20/2024)     Patient History     Smoking Tobacco Use: Never     Smokeless Tobacco Use: Never     Passive Exposure: Not on file   Alcohol Use: Not At Risk (05/01/2024)    Received from [facility]    AUDIT-C     Q1: How often do you have a drink containing alcohol?: Never     Q2: How many drinks containing alcohol do you have on a typical day when you are drinking?: Patient does not drink     Q3: How often do you have six or more drinks on one occasion?: Never   Financial Resource Strain: Low Risk  (05/02/2024)    Received from [facility]    Overall Financial Resource Strain (CARDIA)     Difficulty of Paying Living Expenses: Not hard at all   Recent Concern: Financial Resource Strain - High Risk (04/02/2024)    Received from [facility]    Overall Financial Resource Strain (CARDIA)     Difficulty of Paying Living Expenses: Hard   Food Insecurity: No Food Insecurity (08/06/2024)    Hunger Vital Sign     Worried  About Running Out of Food in the Last Year: Never true     Ran Out of Food in the Last Year: Never true   Transportation Needs: No Transportation Needs (08/06/2024)    PRAPARE - Therapist, Art (Medical): No     Lack of Transportation (Non-Medical): No   Physical Activity: Not on file   Stress: Not on file   Social Connections: Not on file   Intimate Partner Violence: Not on file   Depression: Not at risk (04/20/2024)    Received from [facility]    PHQ-2     Patient Health Questionnaire-2 Score: 0   Housing Stability: Low Risk  (08/06/2024)    Housing Stability Vital Sign     Unable to Pay for Housing in the Last Year: No     Number of Times Moved in the Last Year: 0     Homeless in the Last Year: No   Interpersonal Safety: Not At Risk (08/06/2024)    Interpersonal Safety Domain Source: IP Abuse Screening     Physical abuse: Denies     Verbal abuse: Denies     Emotional abuse: Denies     Financial abuse: Denies     Sexual abuse:  Denies   Utilities: Not At Risk (08/06/2024)    [facility] Utilities     Threatened with loss of utilities: No        I have personally reviewed the radiographs, laboratory data in Epic and decisions and statements above are based partially on this personal interpretation.Needs continued hospitalization for further management                 Care Plan Discussed: family, RN, case manager, specialist    Prophylaxis:  dapt    Disposition:  independent    Time spent 35 min        ___________________________________________________  Melinda DELENA Penton, MD        Subjective:     Chief Complaint: Patient reports that since yesterday evening she has been throwing up multiple times, bilious vomiting.  No blood       Objective:       Vitals:          Last 24hrs VS reviewed since prior progress note. Most recent are:    Vitals:    08/15/24 1050   BP: (!) 191/89   Pulse: 97   Resp:    Temp:    SpO2:      SpO2 Readings from Last 6 Encounters:   08/15/24 95%   07/20/24 94%   07/11/24 98%   07/09/24 98%   07/05/24 95%   07/02/24 95%          Intake/Output Summary (Last 24 hours) at 08/15/2024 1242  Last data filed at 08/15/2024 9167  Gross per 24 hour   Intake 860 ml   Output 3475 ml   Net -2615 ml          Exam:     Physical Exam:    General Alert. No distress.  Patient is actively throwing up in the room   HENT Head Normocephalic and atraumatic.   Eyes Conjunctivae no discharge. No scleral icterus.     Cardio Normal rate, regular rhythm. No murmur, gallop. No chest wall tenderness.    Pulmonary Effort normal. Breath sounds normal. No respiratory distress. No wheezes, rales, or rhonchi.    Abdominal Soft. Bowel sounds normal. No distension/tenderness.  G-tube in place no erythema   Extremities No edema of lower extremities. No tenderness.   Neurological No focal deficits.   Skin Skin is warm and dry. No rash noted. No erythema.    Psychiatric Mood, affect, and judgment normal.         Telemetry reviewed:   normal sinus rhythm    Medications  Reviewed: (see below)    Lab Data Reviewed: (see below)    ______________________________________________________________________    Medications:     Current Facility-Administered Medications   Medication Dose Route Frequency    0.9 % sodium chloride  infusion   IntraVENous Continuous    [START ON 08/17/2024] epoetin  alfa-epbx (RETACRIT ) injection 10,000 Units  10,000 Units SubCUTAneous Q MWF    pantoprazole  (PROTONIX ) tablet 40 mg  40 mg Oral QAM AC    promethazine  (PHENERGAN ) 6.25 MG/5ML syrup 12.5 mg  12.5 mg Per G Tube Q6H PRN    ceFAZolin  (ANCEF ) 1,000 mg in sterile water  10 mL IV syringe  1,000 mg IntraVENous Daily    albumin  human 25% IV solution 25 g  25 g IntraVENous PRN    vitamin B-6 (PYRIDOXINE ) tablet 25 mg  25 mg Oral Q6H    prochlorperazine  (COMPAZINE ) injection 10 mg  10 mg IntraVENous Q6H PRN    famotidine  (PEPCID ) tablet 20 mg  20 mg Oral Daily    busPIRone  (BUSPAR ) tablet 5 mg  5 mg Oral BID    sodium chloride  flush 0.9 % injection 5-40 mL  5-40 mL IntraVENous 2 times per day    sodium chloride  flush 0.9 % injection 5-40 mL  5-40 mL IntraVENous PRN    0.9 % sodium chloride  infusion   IntraVENous PRN    heparin  (porcine) injection 5,000 Units  5,000 Units SubCUTAneous 3 times per day    polyethylene glycol (GLYCOLAX ) packet 17 g  17 g Oral Daily PRN    acetaminophen  (TYLENOL ) tablet 650 mg  650 mg Oral Q6H PRN    Or    acetaminophen  (TYLENOL ) suppository 650 mg  650 mg Rectal Q6H PRN    amLODIPine  (NORVASC ) tablet 10 mg  10 mg Oral Daily    aspirin  chewable tablet 81 mg  81 mg Oral Daily    atorvastatin  (LIPITOR ) tablet 80 mg  80 mg Oral QHS    carvedilol  (COREG ) tablet 12.5 mg  12.5 mg Oral BID WC    clopidogrel  (PLAVIX ) tablet 75 mg  75 mg Oral Daily    DULoxetine  (CYMBALTA ) extended release capsule 60 mg  60 mg Oral Daily    hydrALAZINE  (APRESOLINE ) tablet 50 mg  50 mg Oral TID    sennosides-docusate sodium  (SENOKOT-S) 8.6-50 MG tablet 1 tablet  1 tablet Oral BID    sevelamer  (RENVELA ) packet 0.8  g  0.8 g Oral TID WC    sodium bicarbonate  tablet 650 mg  650 mg Oral TID    oxyCODONE  (ROXICODONE ) immediate release tablet 5 mg  5 mg Oral Q4H PRN    Or    oxyCODONE  HCl (OXY-IR) immediate release tablet 10 mg  10 mg Oral Q4H PRN    morphine  sulfate (PF) injection 2 mg  2 mg IntraVENous Q2H PRN    insulin  lispro (HUMALOG ,ADMELOG ) injection vial 0-8 Units  0-8 Units SubCUTAneous 4x Daily AC & HS            Lab Review:     Recent Labs     08/13/24  0327 08/14/24  0748 08/15/24  0403   WBC 6.2  6.8 6.3   HGB 9.2* 10.3* 10.5*   HCT 28.7* 31.7* 33.4*   PLT 297 349 383     Recent Labs     08/13/24  0327 08/14/24  0748 08/15/24  0403   NA 134* 134* 138   K 4.0 4.4 3.8   CL 98 95* 97*   CO2 24 21 24    BUN 24* 31* 16   MG  --  2.0 1.9   PHOS  --  5.3* 4.0   ALT <5* <5* <5*     No components found for: GLPOC        "

## 2024-08-15 NOTE — Flowsheet Note (Signed)
 "Primary RN SBAR:   Kavin Rolla Ill  RN  Incapacitated Nurse edu. provided: YES  Patient Education provided: [place] CHANGES  Preferred Education method and Primary language: VERBAL  Dialysis consent: [facility] Consent Verified: [facility] associated wait time; reason: 58 MINUTES FOR PT CARE  Hepatitis B Surface Ag   Date/Time Value Ref Range Status   08/07/2024 01:38 AM NONREACTIVE NR   Final     Hep B S Ag Interp   Date/Time Value Ref Range Status   03/16/2023 08:19 AM Negative NEG   Final     Hep B S Ab   Date/Time Value Ref Range Status   08/07/2024 01:38 AM <3.50 mIU/mL Final     Comment:        <10.00 mIU/mL     Non-Immune  =>10.00 mIU/mL    Individual is considered to be immune to infection with HBV       Hep B results, dates, and Primary Source: NEGATIVE/SUSCEPTIBLE  08/07/2024  EPIC  Hep B dual verification performed by (RN): [facility] MATKOVIC RN  Machine disinfect process:ACID/HEAT       08/15/24 1045   Treatment   Treatment Goal 0   Heb B Surface Ag Unknown   Date - Heb B Surface Ag 08/07/24   Heb B Surface Ab <10   Date - Heb B Surface Ab 08/07/24   Observations & Evaluations   Level of Consciousness 0   Oriented X 4   Heart Rhythm Regular   Respiratory Quality/Effort Unlabored   O2 Device None (Room air)   Bilateral Breath Sounds Diminished   Skin Condition/Temp Cool   Appetite Nausea   Abdomen Inspection Soft;Other (Comment)   Bowel Sounds (All Quadrants) Present   Edema Generalized   RUE Edema Trace   Vital Signs   BP (!) 210/99   Temp 97.7 F (36.5 C)   Pulse 92   Respirations 18   SpO2 95 %   Pain Assessment   Pain Assessment 0-10   Technical Checks   Dialysis Machine No. 2   RO Machine Number [id number]   Dialyzer Lot No. [id number]   Tubing Lot Number [id number]   All Connections Secure Yes   NS Bag Yes   Saline Line Double Clamped Yes   Dialyzer F-180   Prime Volume (mL) 200 mL   ICEBOAT I;C;E;B;O;A;T   RO Machine Log Sheet Completed Yes   Machine Alarm Self Test Passed;Completed    Air Foam Detector Tested;Proper Function;pH Reading   Sport And Exercise Psychologist Conductivity 13.8   Manual Conductivity 13.8   Machine Ph 7.4   Manual Ph 7.4   Bleach Test (Neg) Yes   Bath Temperature 96.8 F (36 C)   Hemodialysis Fistula/Graft Arteriovenous fistula Left Arm   No placement date or time found.   Present on Admission/Arrival: Yes  Access Type: Arteriovenous fistula  Orientation: Left  Access Location: Arm   Site Assessment Clean, dry & intact   Thrill Present   Bruit Present   Status Accessed   Venous Needle Size 15 G   Arterial Needle Size 15 G   Accessed ByBETHA Glatter Curlie Macken RN   Access Attempts  1   Date of Last Dressing Change 08/14/24   Access Interventions Chlorhexidine;Aseptic Technique;Needles taped to patient   Dressing Intervention New;Dressing changed   Dressing Status Clean, dry & intact   Dialysis Bath   K+ (Potassium) 2   Ca+ (Calcium ) 2.5  Na+ (Sodium) 138   HCO3 (Bicarb) 35   Bicarbonate Concentrate Lot No. [id number]   Acid Concentrate Lot No. [id number]        08/15/24 1050   Treatment   Time On 1050   Treatment Goal 0   Vital Signs   BP (!) 191/89   Pulse 97   Treatment Initiation   Dialyze Hours 3   Treatment  Initiation Universal Precautions maintained;Lines secured to patient;Prime given;Connections secured;Venous Parameters set;Arterial Parameters set;It consultant engaged;Saline line double clamped;F180   During Hemodialysis Assessment   Blood Flow Rate (ml/min) 400 ml/min   Arterial Pressure (mmHg) -110 mmHg   Venous Pressure (mmHg) 200   TMP 10   DFR 600   Comments tx started   Access Visible Yes   Ultrafiltration Rate (ml/hr) 160 ml/hr   Ultrafiltration Removed (ml) 0 ml        08/15/24 1356   Treatment   Time Off 1356   Treatment Goal 0   Observations & Evaluations   Level of Consciousness 0   Oriented X 4   Heart Rhythm Regular   Respiratory Quality/Effort Unlabored   O2 Device None (Room air)   Bilateral Breath Sounds Diminished   Skin  Condition/Temp Warm   Appetite Nausea   Abdomen Inspection Soft;Other (Comment)   Bowel Sounds (All Quadrants) Present   Edema Generalized   RUE Edema Trace   Vital Signs   BP (!) 170/85   Temp 97.7 F (36.5 C)   Pulse (!) 103   Respirations 16   SpO2 97 %   Pain Assessment   Pain Assessment 0-10   Patient's Stated Pain Goal 0 - No pain   During Hemodialysis Assessment   Blood Flow Rate (ml/min) 400 ml/min   Arterial Pressure (mmHg) -130 mmHg   Venous Pressure (mmHg) 210   TMP 20   DFR 600   Access Visible Yes   Ultrafiltration Rate (ml/hr) 160 ml/hr   Ultrafiltration Removed (ml) 500 ml   Hemodialysis Fistula/Graft Arteriovenous fistula Left Arm   No placement date or time found.   Present on Admission/Arrival: Yes  Access Type: Arteriovenous fistula  Orientation: Left  Access Location: Arm   Site Assessment Clean, dry & intact   Thrill Present   Bruit Present   Status Deaccessed   Venous Needle Size 15 G   Arterial Needle Size 15 G   Date of Last Dressing Change 08/15/24   Post-Hemodialysis Assessment   Post-Treatment Procedures Blood returned;Access bleeding time > 10 minutes   Machine Disinfection Process Acid/Vinegar Clean;Heat Disinfect;Exterior Engineer, Agricultural Volume (ml) 300 ml   Blood Volume Processed (Liters) 67 L   Dialyzer Clearance Lightly streaked   Duration of Treatment (minutes) 180 minutes   Hemodialysis Intake (ml) 500 ml   Hemodialysis Output (ml) 500 ml   NET Removed (ml) 0   Tolerated Treatment Fair   Interventions Taken Fluid bolus   Patient Response to Treatment tolerated as expected.   Physician Notified Yes   Provider Notification   Reason for Communication Evaluate   Handoff   Handoff Given To Kavin Rolla Ill RN   Handoff Received From [facility] Shalise Rosado RN   Handoff Communication Other     Primary RN SBAR: Kavin Rolla Ill RN   Comments: Tx completed as ordered. Pt denies pain/discomfort post tx. Pt sent back to room.     "

## 2024-08-15 NOTE — Progress Notes (Signed)
 "                                                                                                 Melinda Peach, PA-C                       [phone] office             Monday-Friday 8:00 am-4:30 pm  I am not permitted to use perfect serve use above for contact, thanks.        Gastroenterology Progress Note    August 15, 2024  Admit Date: 08/06/2024         Narrative Assessment and Plan   43 year old female well-known to GI with prior history of intractable nausea and vomiting found to have gastroparesis, inability to reliably obtain prokinetic and other GI related medications in outpatient setting, ultimately seen at [facility] with placement of PEG/J tube to address enteral access and GI distress symptoms, now admitted 08/06/2024 to [name] with hypertensive emergency.  While admitted, GI has been consulted for emesis with J-tube.  Alkaline phosphatase is elevated, GGT was elevated last admission.  Underwent MRCP last admission for assessment of this with no biliary dilation.  Already s/p cholecystectomy 2024.  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 pylori negative.  UDS intermittently positive for THC, although most recently negative in May 2025.  CT A/P confirms gastrojejunostomy tube in appropriate place.     08/15/24:  Patient still with clear liquid/mucus up.  She is not vomiting up her tube feeds, which are being administered into the jejunum.  Urine drug screen negative this admission.     Impression:  Intractable nausea and vomiting  Gastroparesis s/p PEG/J     Plan:  Reasonable to continue pantoprazole  40 mg once daily and Pepcid  20 mg twice daily.  Consider scheduled antiemetics such as Zofran  or Reglan  3 times daily.  In the past, the patient has consumed some food by mouth in addition to tube feeds.  Until nausea and vomiting resolved, recommend strictly using feeding tube for nutrition as this is  postpyloric.  PEG/J was placed by [facility] due to gastroparesis with intractable vomiting.  We do not have a good solution to completely resolve her vomiting at this time.  [facility] originally placed PEG/GJ for this reason, and it can be used to administer nutrition despite her spitting up of clear liquid.  She is not vomiting her tube feed content.  Inpatient GI to sign off. Please reconsult if needed further.     Melinda PARAS Melinda Garman, PA-C    Subjective:   Reason for visit: Recurrent nausea and vomiting    HPI: Called back to see patient due to recurrent nausea and vomiting.  Assessed while on dialysis.  Discussed with nursing, patient is having  spit up of clear liquid sputum/phlegm.  Vomiting does not look like her tube feeds at all.    ROS:  The previous review of systems on initial consultation / H&P is noted and reviewed.  Specific changes noted above in HPI.    Current Medications:     Current Facility-Administered Medications   Medication Dose Route Frequency    [START ON 08/17/2024] epoetin  alfa-epbx (RETACRIT ) injection 10,000 Units  10,000 Units SubCUTAneous Q MWF    PN-Adult Premix 4.25/5 - Standard Electrolytes- Peripheral Line   IntraVENous Continuous TPN    fat emulsion (INTRALIPID /NUTRILIPID ) 20 % infusion 250 mL  250 mL IntraVENous Q MWF    pantoprazole  (PROTONIX ) tablet 40 mg  40 mg Oral QAM AC    promethazine  (PHENERGAN ) 6.25 MG/5ML syrup 12.5 mg  12.5 mg Per G Tube Q6H PRN    ceFAZolin  (ANCEF ) 1,000 mg in sterile water  10 mL IV syringe  1,000 mg IntraVENous Daily    albumin  human 25% IV solution 25 g  25 g IntraVENous PRN    vitamin B-6 (PYRIDOXINE ) tablet 25 mg  25 mg Oral Q6H    prochlorperazine  (COMPAZINE ) injection 10 mg  10 mg IntraVENous Q6H PRN    famotidine  (PEPCID ) tablet 20 mg  20 mg Oral Daily    busPIRone  (BUSPAR ) tablet 5 mg  5 mg Oral BID    sodium chloride  flush 0.9 % injection 5-40 mL  5-40 mL IntraVENous 2 times per day    sodium chloride  flush 0.9 % injection 5-40 mL  5-40 mL IntraVENous  PRN    0.9 % sodium chloride  infusion   IntraVENous PRN    heparin  (porcine) injection 5,000 Units  5,000 Units SubCUTAneous 3 times per day    polyethylene glycol (GLYCOLAX ) packet 17 g  17 g Oral Daily PRN    acetaminophen  (TYLENOL ) tablet 650 mg  650 mg Oral Q6H PRN    Or    acetaminophen  (TYLENOL ) suppository 650 mg  650 mg Rectal Q6H PRN    amLODIPine  (NORVASC ) tablet 10 mg  10 mg Oral Daily    aspirin  chewable tablet 81 mg  81 mg Oral Daily    atorvastatin  (LIPITOR ) tablet 80 mg  80 mg Oral QHS    carvedilol  (COREG ) tablet 12.5 mg  12.5 mg Oral BID WC    clopidogrel  (PLAVIX ) tablet 75 mg  75 mg Oral Daily    DULoxetine  (CYMBALTA ) extended release capsule 60 mg  60 mg Oral Daily    hydrALAZINE  (APRESOLINE ) tablet 50 mg  50 mg Oral TID    sennosides-docusate sodium  (SENOKOT-S) 8.6-50 MG tablet 1 tablet  1 tablet Oral BID    sevelamer  (RENVELA ) packet 0.8 g  0.8 g Oral TID WC    sodium bicarbonate  tablet 650 mg  650 mg Oral TID    oxyCODONE  (ROXICODONE ) immediate release tablet 5 mg  5 mg Oral Q4H PRN    Or    oxyCODONE  HCl (OXY-IR) immediate release tablet 10 mg  10 mg Oral Q4H PRN    morphine  sulfate (PF) injection 2 mg  2 mg IntraVENous Q2H PRN    insulin  lispro (HUMALOG ,ADMELOG ) injection vial 0-8 Units  0-8 Units SubCUTAneous 4x Daily AC & HS       Objective:     VITALS:   Last 24hrs VS reviewed since prior progress note. Most recent are:  Vitals:    08/15/24 1515   BP: (!) 191/93   Pulse:    Resp:    Temp:    SpO2:      Temp (24hrs), Avg:98.2 F (36.8 C), Min:97.7 F (36.5 C), Max:98.8 F (37.1 C)      Intake/Output Summary (Last 24 hours) at 08/15/2024 1524  Last  data filed at 08/15/2024 1356  Gross per 24 hour   Intake 860 ml   Output 1475 ml   Net -615 ml       General: alert and no distress  Head: Normocephalic, without obvious abnormality, atraumatic  Eyes: anicteric sclerae and conjuntiva clear  Lungs: normal respiratory effort  Ext: no cyanosis and no edema  Skin: normal skin color, no rashes, and  texture normal  Neuro:  Alert and oriented  Psych: not anxious, cooperative, appropriate affect     Lab Data Reviewed:   Recent Labs     08/13/24  0327 08/14/24  0748 08/15/24  0403   WBC 6.2 6.8 6.3   HGB 9.2* 10.3* 10.5*   HCT 28.7* 31.7* 33.4*   PLT 297 349 383     Recent Labs     08/13/24  0327 08/14/24  0748 08/15/24  0403   NA 134* 134* 138   K 4.0 4.4 3.8   CL 98 95* 97*   CO2 24 21 24    BUN 24* 31* 16   MG  --  2.0 1.9   PHOS  --  5.3* 4.0   ALT <5* <5* <5*     No results found for: GLUCPOC  No results for input(s): PH, PCO2, PO2, HCO3, FIO2 in the last 72 hours.  No results for input(s): INR in the last 72 hours.        Assessment:   (See above)  Principal Problem:    Hypertensive emergency  Active Problems:    MSSA bacteremia    End-stage renal disease on hemodialysis (HCC)  Resolved Problems:    * No resolved [name] problems. *      Plan:   (See above)      Signed By: Melinda JINNY Peach, PA-C     08/15/2024  3:24 PM         "

## 2024-08-15 NOTE — Progress Notes (Signed)
 "                            [name]  Date of Birth: 07/09/1981          Assessment & Plan:     ESRD on HD MWF via [facility], [facility], f/b Dr. Kalvin  Hypertensive urgency-better after HD  DM  AOCD  Chronic abd pain, n/v from gastroparesis-reason for admit  Staph bacteremia cxs now negative     Plan:  HD yest tol well  HD today to get back on schedule  HD MWF  ESA MWF       Subjective:   CC: ESRD  HPI: Dialysis went well yesterday    Current Facility-Administered Medications   Medication Dose Route Frequency    0.9 % sodium chloride  infusion   IntraVENous Continuous    pantoprazole  (PROTONIX ) tablet 40 mg  40 mg Oral QAM AC    promethazine  (PHENERGAN ) 6.25 MG/5ML syrup 12.5 mg  12.5 mg Per G Tube Q6H PRN    ceFAZolin  (ANCEF ) 1,000 mg in sterile water  10 mL IV syringe  1,000 mg IntraVENous Daily    albumin  human 25% IV solution 25 g  25 g IntraVENous PRN    epoetin  alfa-epbx (RETACRIT ) injection 10,000 Units  10,000 Units SubCUTAneous Q MWF    vitamin B-6 (PYRIDOXINE ) tablet 25 mg  25 mg Oral Q6H    prochlorperazine  (COMPAZINE ) injection 10 mg  10 mg IntraVENous Q6H PRN    famotidine  (PEPCID ) tablet 20 mg  20 mg Oral Daily    busPIRone  (BUSPAR ) tablet 5 mg  5 mg Oral BID    sodium chloride  flush 0.9 % injection 5-40 mL  5-40 mL IntraVENous 2 times per day    sodium chloride  flush 0.9 % injection 5-40 mL  5-40 mL IntraVENous PRN    0.9 % sodium chloride  infusion   IntraVENous PRN    heparin  (porcine) injection 5,000 Units  5,000 Units SubCUTAneous 3 times per day    polyethylene glycol (GLYCOLAX ) packet 17 g  17 g Oral Daily PRN    acetaminophen  (TYLENOL ) tablet 650 mg  650 mg Oral Q6H PRN    Or    acetaminophen  (TYLENOL ) suppository 650 mg  650 mg Rectal Q6H PRN    amLODIPine  (NORVASC ) tablet 10 mg  10 mg Oral Daily    aspirin  chewable tablet 81 mg  81 mg Oral Daily    atorvastatin  (LIPITOR ) tablet 80 mg  80 mg Oral QHS    carvedilol  (COREG ) tablet 12.5 mg  12.5  mg Oral BID WC    clopidogrel  (PLAVIX ) tablet 75 mg  75 mg Oral Daily    DULoxetine  (CYMBALTA ) extended release capsule 60 mg  60 mg Oral Daily    hydrALAZINE  (APRESOLINE ) tablet 50 mg  50 mg Oral TID    sennosides-docusate sodium  (SENOKOT-S) 8.6-50 MG tablet 1 tablet  1 tablet Oral BID    sevelamer  (RENVELA ) packet 0.8 g  0.8 g Oral TID WC    sodium bicarbonate  tablet 650 mg  650 mg Oral TID    oxyCODONE  (ROXICODONE ) immediate release tablet 5 mg  5 mg Oral Q4H PRN    Or    oxyCODONE  HCl (OXY-IR) immediate release tablet 10 mg  10 mg Oral Q4H PRN    morphine  sulfate (PF) injection 2 mg  2 mg IntraVENous Q2H PRN    insulin   lispro (HUMALOG ,ADMELOG ) injection vial 0-8 Units  0-8 Units SubCUTAneous 4x Daily AC & HS          Objective:     Vitals:  Blood pressure 133/69, pulse 94, temperature 98.8 F (37.1 C), temperature source Oral, resp. rate 18, height 1.499 m (4' 11.02), weight 54.6 kg (120 lb 5.9 oz), SpO2 96%.  Temp (24hrs), Avg:98.2 F (36.8 C), Min:97.6 F (36.4 C), Max:98.8 F (37.1 C)      Intake and Output:  11/05 0701 - 11/05 1900  In: -   Out: 300   11/03 1901 - 11/05 0700  In: 880 [P.O.:50]  Out: 3175     Physical Exam:               GENERAL ASSESSMENT: NAD  HEENT: Nontraumatic   EXTREMITY: no EDEMA; LUE AVG NT, no swelling/warmth, good t/b  NEURO: Grossly non focal          ECG/rhythm:    Data Review        Recent Labs     08/13/24  0327 08/14/24  0748 08/15/24  0403   NA 134* 134* 138   K 4.0 4.4 3.8   CL 98 95* 97*   CO2 24 21 24    BUN 24* 31* 16   CREATININE 5.41* 6.49* 4.57*   GLUCOSE 113* 126* 158*   CALCIUM  8.0* 8.2* 8.1*       : Melinda Aran, MD  08/15/2024        [facility]:  [email or web address]  [email or web address]    [place] office:  [address] [place], [address]  [place], [place] [address]  Phone: [phone]  Fax :     [phone]    [facility] office:  [address]  [place], [place]  [address]  Phone - [phone]  Fax - [phone]                                                                                         "

## 2024-08-16 LAB — CBC WITH AUTO DIFFERENTIAL
Band Neutrophils: 2 %
Basophils %: 0 % (ref 0.0–1.0)
Basophils Absolute: 0 K/UL (ref 0.00–0.10)
Eosinophils %: 1 % (ref 0.0–7.0)
Eosinophils Absolute: 0.07 K/UL (ref 0.00–0.40)
Hematocrit: 37.4 % (ref 35.0–47.0)
Hemoglobin: 12.2 g/dL (ref 11.5–16.0)
Immature Granulocytes %: 0 % (ref 0.0–0.5)
Immature Granulocytes Absolute: 0 K/UL (ref 0.00–0.04)
Lymphocytes %: 13 % (ref 12.0–49.0)
Lymphocytes Absolute: 0.86 K/UL (ref 0.80–3.50)
MCH: 30.8 pg (ref 26.0–34.0)
MCHC: 32.6 g/dL (ref 30.0–36.5)
MCV: 94.4 FL (ref 80.0–99.0)
MPV: 9.1 FL (ref 8.9–12.9)
Metamyelocytes: 1 %
Monocytes %: 7 % (ref 5.0–13.0)
Monocytes Absolute: 0.46 K/UL (ref 0.00–1.00)
Myelocytes: 1 %
Neutrophils %: 75 % (ref 32.0–75.0)
Neutrophils Absolute: 5.08 K/UL (ref 1.80–8.00)
Nucleated RBCs: 1.2 /100{WBCs} — ABNORMAL HIGH
Platelets: 404 K/uL — ABNORMAL HIGH (ref 150–400)
RBC: 3.96 M/uL (ref 3.80–5.20)
RDW: 15.3 % — ABNORMAL HIGH (ref 11.5–14.5)
WBC: 6.6 K/uL (ref 3.6–11.0)
nRBC: 0.08 K/uL — ABNORMAL HIGH (ref 0.00–0.01)

## 2024-08-16 LAB — COMPREHENSIVE METABOLIC PANEL
ALT: <5 U/L — ABNORMAL LOW (ref 10–35)
AST: 24 U/L (ref 10–35)
Albumin/Globulin Ratio: 0.8 — ABNORMAL LOW (ref 1.1–2.2)
Albumin: 2.7 g/dL — ABNORMAL LOW (ref 3.5–5.2)
Alk Phosphatase: 132 U/L — ABNORMAL HIGH (ref 35–104)
Anion Gap: 17 mmol/L — ABNORMAL HIGH (ref 2–14)
BUN/Creatinine Ratio: 3 — ABNORMAL LOW (ref 12–20)
BUN: 10 mg/dL (ref 6–20)
CO2: 24 mmol/L (ref 20–29)
Calcium: 8.4 mg/dL — ABNORMAL LOW (ref 8.6–10.0)
Chloride: 99 mmol/L (ref 98–107)
Creatinine: 3.57 mg/dL — ABNORMAL HIGH (ref 0.60–1.00)
Est, Glom Filt Rate: 16 ml/min/1.73m2 — ABNORMAL LOW (ref >59)
Globulin: 3.2 g/dL (ref 2.0–4.0)
Glucose: 143 mg/dL — ABNORMAL HIGH (ref 65–100)
Potassium: 3.6 mmol/L (ref 3.5–5.1)
Sodium: 140 mmol/L (ref 136–145)
Total Bilirubin: 0.3 mg/dL (ref 0.0–1.2)
Total Protein: 5.9 g/dL — ABNORMAL LOW (ref 6.4–8.3)

## 2024-08-16 LAB — POCT GLUCOSE
POC Glucose: 147 mg/dL — ABNORMAL HIGH (ref 65–117)
POC Glucose: 150 mg/dL — ABNORMAL HIGH (ref 65–117)
POC Glucose: 153 mg/dL — ABNORMAL HIGH (ref 65–117)
POC Glucose: 155 mg/dL — ABNORMAL HIGH (ref 65–117)
POC Glucose: 300 mg/dL — ABNORMAL HIGH (ref 65–117)

## 2024-08-16 LAB — CULTURE, BLOOD 2: Culture: NO GROWTH

## 2024-08-16 LAB — CULTURE, BLOOD 1: Culture: NO GROWTH

## 2024-08-16 MED ORDER — METOCLOPRAMIDE HCL 5 MG/ML IJ SOLN
5 | Freq: Three times a day (TID) | INTRAMUSCULAR | Status: DC
Start: 2024-08-16 — End: 2024-08-16

## 2024-08-16 MED ORDER — CARVEDILOL 12.5 MG PO TABS
12.5 | Freq: Two times a day (BID) | ORAL | Status: DC
Start: 2024-08-16 — End: 2024-08-28
  Administered 2024-08-16 – 2024-08-26 (×19): 25 mg via GASTROSTOMY

## 2024-08-16 MED ORDER — ONDANSETRON HCL 4 MG/5ML PO SOLN
4 | Freq: Four times a day (QID) | ORAL | Status: DC | PRN
Start: 2024-08-16 — End: 2024-08-17
  Administered 2024-08-17: 18:00:00 4 mg via GASTROSTOMY

## 2024-08-16 MED ORDER — NIFEDIPINE ER OSMOTIC RELEASE 30 MG PO TB24
30 | Freq: Every day | ORAL | Status: DC
Start: 2024-08-16 — End: 2024-08-22
  Administered 2024-08-16 – 2024-08-21 (×3): 90 mg via ORAL

## 2024-08-16 MED ORDER — METOCLOPRAMIDE HCL 10 MG PO TABS
10 | Freq: Three times a day (TID) | ORAL | Status: DC
Start: 2024-08-16 — End: 2024-08-17
  Administered 2024-08-16 – 2024-08-17 (×2): 5 mg via GASTROSTOMY

## 2024-08-16 MED ORDER — SCOPOLAMINE 1 MG/3DAYS TD PT72
1 | TRANSDERMAL | Status: DC
Start: 2024-08-16 — End: 2024-08-27
  Administered 2024-08-16 – 2024-08-25 (×4): 1 via TRANSDERMAL

## 2024-08-16 MED FILL — SENNOSIDES-DOCUSATE SODIUM 8.6-50 MG PO TABS: 8.6-50 mg | ORAL | Qty: 1

## 2024-08-16 MED FILL — PROCHLORPERAZINE EDISYLATE 10 MG/2ML IJ SOLN: 10 MG/2ML | INTRAMUSCULAR | Qty: 2

## 2024-08-16 MED FILL — VITAMIN B-6 50 MG PO TABS: 50 mg | ORAL | Qty: 1

## 2024-08-16 MED FILL — OXYCODONE HCL 10 MG PO TABS: 10 mg | ORAL | Qty: 1

## 2024-08-16 MED FILL — HEPARIN SODIUM (PORCINE) 5000 UNIT/ML IJ SOLN: 5000 [IU]/mL | INTRAMUSCULAR | Qty: 1

## 2024-08-16 MED FILL — BUSPIRONE HCL 10 MG PO TABS: 10 mg | ORAL | Qty: 1

## 2024-08-16 MED FILL — PROMETHAZINE HCL 6.25 MG/5ML PO SOLN: 6.25 MG/5ML | ORAL | Qty: 10

## 2024-08-16 MED FILL — MORPHINE SULFATE 4 MG/ML IJ SOLN: 4 mg/mL | INTRAMUSCULAR | Qty: 1

## 2024-08-16 MED FILL — ATORVASTATIN CALCIUM 20 MG PO TABS: 20 mg | ORAL | Qty: 4

## 2024-08-16 MED FILL — HYDRALAZINE HCL 25 MG PO TABS: 25 mg | ORAL | Qty: 2

## 2024-08-16 MED FILL — CEFAZOLIN SODIUM 1 G IJ SOLR: 1 g | INTRAMUSCULAR | Qty: 1000

## 2024-08-16 MED FILL — SCOPOLAMINE 1 MG/3DAYS TD PT72: 1 MG/3DAYS | TRANSDERMAL | Qty: 1

## 2024-08-16 MED FILL — ASPIRIN 81 MG PO CHEW: 81 mg | ORAL | Qty: 1

## 2024-08-16 MED FILL — SEVELAMER CARBONATE 0.8 G PO PACK: 0.8 g | ORAL | Qty: 1

## 2024-08-16 MED FILL — DULOXETINE HCL 30 MG PO CPEP: 30 mg | ORAL | Qty: 2

## 2024-08-16 MED FILL — INSULIN LISPRO 100 UNIT/ML IJ SOLN: 100 [IU]/mL | INTRAMUSCULAR | Qty: 6

## 2024-08-16 MED FILL — CARVEDILOL 12.5 MG PO TABS: 12.5 mg | ORAL | Qty: 2

## 2024-08-16 MED FILL — SODIUM BICARBONATE 650 MG PO TABS: 650 mg | ORAL | Qty: 1

## 2024-08-16 MED FILL — PANTOPRAZOLE SODIUM 40 MG PO TBEC: 40 mg | ORAL | Qty: 1

## 2024-08-16 MED FILL — FAMOTIDINE 20 MG PO TABS: 20 mg | ORAL | Qty: 1

## 2024-08-16 MED FILL — OXYCODONE HCL 5 MG PO TABS: 5 mg | ORAL | Qty: 1

## 2024-08-16 MED FILL — METOCLOPRAMIDE HCL 10 MG PO TABS: 10 mg | ORAL | Qty: 1

## 2024-08-16 MED FILL — NIFEDIPINE ER OSMOTIC RELEASE 30 MG PO TB24: 30 mg | ORAL | Qty: 3

## 2024-08-16 MED FILL — ONDANSETRON HCL 4 MG/5ML PO SOLN: 4 MG/5ML | ORAL | Qty: 5

## 2024-08-16 MED FILL — CLOPIDOGREL BISULFATE 75 MG PO TABS: 75 mg | ORAL | Qty: 1

## 2024-08-16 NOTE — Progress Notes (Signed)
 "        [facility]                                                                                          Hospitalist Progress Note  Prentice MALVA Flemings, MD  Office Phone: [phone]        Date of Service:  08/16/2024  NAME:  Melinda [name]  DOB:  04-07-1981  MRN:  [id number]     HPI:   11/6  Nauseous and having yellow emesis before and after tube feeds have been started today, no change. Feels that her nausea and emesis have improved since the beginning of the admission, when it was the worst.     Assessment & Plan:     43 year old female with ESRD, type 2 diabetes, hypertension, admitted with acute on chronic vomiting and abdominal pain likely secondary to gastroparesis.    Gastroparesis with emesis abdominal pain, status post GJ tube  - Per GI note, patient has intractable vomiting that is difficult to treat, but should continue to use her GJ tube  - Holding off on catheter for long-term TPN in an effort to maintain nutrition through tube feeds  - continue tube feeds  - pt requested oral diet  - PPI and Pepcid   - Zofran  and Reglan   - Scopolamine  patch    Sepsis secondary to MSSA bacteremia  - Per ID, cefazolin  2 g on HD days through 09/19/2024    Prolonged QT  - Monitor EKG every other day on antiemetics    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  POA  - Phosphorus downtrending to normal continue sevelamer       Fentanyl  positive: Seen on drug screen.  May be a false positive in the setting of Benadryl  use.  Patient adamantly denies use.     Hypertension POA. Improved  Will continue patient's home Coreg /hydralazine /amlodipine   -Increased Coreg   - Avoid as needed and single dose IV antibiotics since associated with high risk of various forms of ischemia     ESRD  On HD  Nephrology following     GERD: Chronic.    Continue Pepcid  and pantop     T2DM with gastroparesis. POA, chronic.  Last A1c of 6.1 on 9/17.    -Sliding scale insulin .      Chronic pain or  anxiety/depression  -Duloxetine  and BuSpar .     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 Mesenteric MRA 01/22/24 with proximally and distally patent SMA and diffuse anasarca.   - will continue plavix  and ASA     Anemia: Trend.  Follow-up outpatient       Code status:   Full Code  DVT Prophylaxis: heparin   Anticipated Disposition: home once symptoms improved       Review of Systems:   Pertinent symptoms noted in the HPI    Vital Signs:    Last 24hrs VS reviewed since prior progress note. Most recent are:  Vitals:    08/16/24 0800  BP: (!) 170/88   Pulse: (!) 106   Resp: 16   Temp: 97.5 F (36.4 C)   SpO2: 96%       Physical Examination:     I had a face to face encounter with this patient and independently examined them on 08/16/2024 as outlined below:        General : alert and awake, no acute distress  HEENT: moist mucus membranes  Chest: CTAB, normal WOB  CVS: S1 and S2 heard, no m/g/r  Abd: non distended. No TTP  Ext: WWP, no edema  Neuro/Psych: no focal deficits  Skin: no rash      Labs and imaging:     Recent Labs     08/15/24  0403 08/16/24  0511   WBC 6.3 6.6   HGB 10.5* 12.2   HCT 33.4* 37.4   PLT 383 404*     Recent Labs     08/14/24  0748 08/15/24  0403 08/16/24  0511   NA 134* 138 140   K 4.4 3.8 3.6   CL 95* 97* 99   CO2 21 24 24    BUN 31* 16 10   MG 2.0 1.9  --    PHOS 5.3* 4.0  --      Recent Labs     08/14/24  0748 08/15/24  0403 08/16/24  0511   ALT <5* <5* <5*   GLOB 3.1 2.9 3.2     No results for input(s): INR, APTT in the last 72 hours.    Invalid input(s): PTP   No results for input(s): TIBC in the last 72 hours.    Invalid input(s): FE, PSAT, FERR   No results found for: RBCF   No results for input(s): PH, PCO2, PO2 in the last 72 hours.  No results for input(s): CPK in the last 72 hours.    Invalid input(s): CPKMB, CKNDX, TROIQ  Lab Results   Component Value Date/Time    CHOL 192  08/07/2024 11:19 AM    HDL 80 08/07/2024 11:19 AM    LDL 78 08/07/2024 11:19 AM    LDL Not calculated due to elevated triglyceride level 09/16/2022 10:30 AM     No results found for: GLUCPOC  @LABUA @      US  EXTREMITY LEFT NON VASC LIMITED   Final Result   An ill-defined, heterogeneously hypoechoic mass anterior to the AV fistula graft   measures 6 x 12 x 16 mm. Internal blood flow cannot be determined due to   adjacent arterial flow causing artifact. Phlegmon or old hematoma are possible.   It is not a circumscribed, walled off abscess. The patient reports that this has   been palpable for 2 years, favoring old hematoma.         Electronically signed by Medford Manners      MRI LUMBAR SPINE W WO CONTRAST   Final Result      Degenerative changes are conspicuous at the cervical spine.      Moderate to severe canal stenosis at C4-5 and moderate canal stenosis at C3-C4.      No other canal or foraminal compromise is identified.      There is no fracture or dislocation.      There is no cord signal abnormality or abnormal intrathecal enhancement. No   evidence of discitis/osteomyelitis.                     Electronically signed by JIMMY RABON  MRI THORACIC SPINE W WO CONTRAST   Final Result      Degenerative changes are conspicuous at the cervical spine.      Moderate to severe canal stenosis at C4-5 and moderate canal stenosis at C3-C4.      No other canal or foraminal compromise is identified.      There is no fracture or dislocation.      There is no cord signal abnormality or abnormal intrathecal enhancement. No   evidence of discitis/osteomyelitis.                     Electronically signed by JIMMY RABON      MRI CERVICAL SPINE W WO CONTRAST   Final Result      Degenerative changes are conspicuous at the cervical spine.      Moderate to severe canal stenosis at C4-5 and moderate canal stenosis at C3-C4.      No other canal or foraminal compromise is identified.      There is no fracture or dislocation.       There is no cord signal abnormality or abnormal intrathecal enhancement. No   evidence of discitis/osteomyelitis.                     Electronically signed by JIMMY HABIB      CT Upper Extremity Left W Contrast   Final Result   Nonspecific 2 cm area of subcutaneous attenuation anterior to the   distal graft anastomosis. A defined collection is not shown. Further evaluation   with ultrasound at the distal anastomotic site of the graft is recommended to   exclude small abscess.         Electronically signed by Alm Edman, MD      XR CHEST PORTABLE   Final Result   No acute cardiopulmonary process.      Electronically signed by Harden Cahill      CT ABDOMEN PELVIS WO CONTRAST Additional Contrast? None   Final Result   No acute intraperitoneal process is identified.   Gastrojejunostomy.   Colonic diverticulosis with no diverticulitis.   Incidental and/or nonemergent findings are as described in detail above.         Electronically signed by JIMMY HABIB          08/06/24    ECHO (TTE) COMPLETE (PRN CONTRAST/BUBBLE/STRAIN/3D) 08/12/2024  1:21 PM (Final)    Interpretation Summary    Left Ventricle: Low normal left ventricular systolic function. EF by visual approximation is 55%. Left ventricle size is normal. Findings consistent with moderate concentric hypertrophy. Normal wall motion. Diastolic dysfunction present with normal LV EF.    Right Ventricle: Right ventricle size is normal. Normal systolic function.    Aortic Valve: Mildly calcified cusps. Mild sclerosis of the aortic valve cusps.    Mitral Valve: There is mild annular calcification noted.    Image quality is good.    Signed by: Vikas K Rathi, MD on 08/12/2024  1:21 PM        Level of Service Documentation     In caring for this patient today, I reviewed recent notes, interpreted lab results and imaging tests, had a face-to-face encounter with the patient, performing the medically necessary appropriate exam and history, counseled the patient/family/caregiver,  ordered medications, tests and procedures as indicated for diagnostic and therapeutic purposes, consulted and communicated with other healthcare professionals including care coordination and discharge planning, and documented clinical information in the electronic health record.  ______________________________________________________________________                 Prentice MALVA Flemings, MD     "

## 2024-08-16 NOTE — Consults (Signed)
 Session ID: [id number]  Session Duration: 14 minutes  Language: Spanish  Interpreter ID: #[id number]  Interpreter Name: Alfonso

## 2024-08-16 NOTE — Progress Notes (Signed)
 "Comprehensive Nutrition Assessment    Type and Reason for Visit: Reassess    Nutrition Recommendations/Plan:     Modify TF order per below:   - Nepro @ 35 mL/hour  - FWF 75 mL q 2 hours       Malnutrition Assessment:  Malnutrition Status:  Insufficient data (08/07/24 1316)    Context:  Chronic Illness     Findings of the 6 clinical characteristics of malnutrition:  Energy Intake:  Mild decrease in energy intake  Weight Loss:  Mild weight loss     Body Fat Loss:  Unable to assess     Muscle Mass Loss:  Unable to assess    Fluid Accumulation:  No fluid accumulation    Grip Strength:  Not Performed     Nutrition Assessment:    43 yo female admitted for HTN emergency, N/V. Pmhx: DM, Gastroparesis, HTN, ESRD - on HD, GJ tube.      11/6: Follow up. TF has not been ran since morning of 11.05. PN order placed but has not been delivered due to inadequate access. GI assessment yesterday concluded small frequent emesis is not composed of or caused by tube feeds. Will re-start TF today. Modifying order from cyclic to continuous and will attempt to meet needs with TF alone. Per nurse, small emesis has continued this AM - clear/mucus. Will trial continuous TF today and reassess tomorrow.     Trickle feeds Nepro @ 35 mL/hour provides 1386 kcal (100% needs),123 g carbs, 62 g protein (90% needs) TF + FWF provides 1384 H2O/day.     11/5:  Follow up.  TF ran over night 11/3 as Nepro at 30 ml/hr x 10 hours which she tolerated with no complaints.  Last night we increased the rate to 40 ml/hr and she had n/v.  NPO now.  GI to see.  Spoke with daughter who states feeding was administered through J-port.  Will need to start PN until pt able to tolerate PO/TF.    PPN rec's:  goal rate of D5, 4.25% AA at 83 ml/hr + 250 ml 20% lipids MWF (TG level of 352).    This will provide 890 kcals (65% kcal needs), 85 gm protein (100% protein needs)    11/3: Follow up.  Pt remains NPO.  Have reached out to MD regarding need to start nutrition support,  J-tube feeds vs PPN.  Still having N/V.  No new weight. TF rec's below in bold.      10/30:  Follow up.  Pt remains NPO.  Will recommend starting TF via J port.  Still having N/V.  No new weight.      10/28: MD consult received for TF rec's. Chart review indicate G-J tube placed in August 2025 and started on cyclic TF.  Spoke with pt's daughter at bedside.  She reports pt eats by mouth at home.  Some days she has n/v and is not able to tolerate PO so she gives herself 3 bolus feeds of NovaSource Renal 2.0 3x/day.  Pt and daughter unsure how much she gives herself, 2 large syringes full of feed.  On days when she eats and tolerates PO then she does not use feeding tube or might do one feed at night.  When I asked what foods she eats by mouth, she told me soups, cooked vegetables, and fruits.  Discussed low fiber diet necessary for gastroparesis, recommended all vegetables be cooked very soft, no beans in her soups, and fruits be canned.  Daughter states she has been losing weight, unable to quantify.  Weight hx in EMR indicates 6% loss over last 5 months which is not significant for time frame.  Weight seems to fluctuate between 115-135 lbs over last 1.5 years.       Pt currently NPO, MD wants to hold off on starting TF.  Pt was actively vomiting during my visit.  Did not complete NFPE because of this.      TF rec's when ready to start TF:  Nepro at 40 ml/hr x 10 hours + 1 ProSource daily + 30 ml h20 flush q4h around the clock.  This will provide 400 ml, 800 kcals (59% kcal needs), 52 gm protein (74% protein needs), 64 gm CHO, 290 + 30 + 180 = 500 ml water           Nutritionally Significant Medications:  NaCl at 100 ml/hr; Epo, Renvela , Vit B6    Estimated Daily Nutrient Needs:  Energy Requirements Based On: Kcal/kg  Weight Used for Energy Requirements: Current  Energy (kcal/day): 1365-1640 (25-30 kcals/kg)  Weight Used for Protein Requirements: Current  Protein (g/day): 70 (1.3 gm/kg)  Method Used for Fluid  Requirements: 1 ml/kcal  Fluid (ml/day): 1365    Nutrition Related Findings:   Edema: Generalized      RUE Edema: Trace           Recent Labs     08/14/24  0748 08/15/24  0403 08/16/24  0511   GLUCOSE 126* 158* 143*   BUN 31* 16 10   CREATININE 6.49* 4.57* 3.57*   NA 134* 138 140   K 4.4 3.8 3.6   CL 95* 97* 99   CO2 21 24 24    CALCIUM  8.2* 8.1* 8.4*   PHOS 5.3* 4.0  --    MG 2.0 1.9  --      Recent Labs     08/13/24  1721 08/13/24  2121 08/14/24  0609 08/15/24  0611 08/15/24  1658 08/15/24  2059 08/15/24  2124 08/16/24  0825 08/16/24  1148   POCGLU 134* 115 141* 147* 145* 147* 150* 153* 155*     Lab Results   Component Value Date/Time    LABA1C 6.1 06/27/2024 05:10 AM    LABA1C 7.0 02/19/2024 05:02 AM    LABA1C 6.8 10/24/2023 03:52 AM    EAG 127 06/27/2024 05:10 AM     Triglycerides   Date Value Ref Range Status   08/14/2024 352 (H) 0 - 150 MG/DL Final     Comment:     Borderline High: 150-199 mg/dL, High: 799-500 mg/dL  Very High: Greater than or equal to 500 mg/dL     89/71/7974 828 (H) 0 - 150 MG/DL Final     Comment:     Borderline High: 150-199 mg/dL, High: 799-500 mg/dL  Very High: Greater than or equal to 500 mg/dL     98/98/7974 844 (H) <150 MG/DL Final     Comment:     Based on NCEP-ATP III:  Triglycerides <150 mg/dL  is considered normal, 150-199 mg/dL  borderline high,  799-500 mg/dL high and  greater than or equal to 500 mg/dL very high.       Last BM: 08/13/24    Wounds: Wound Type: None    Current Nutrition Therapies:  Diet: NPO  Supplements: none  Meal Intake:   No data found.  Supplement Intake:  No data found.  Nutrition Support: none    Anthropometric Measures:  Height: 149.9  cm (4' 11.02)  Ideal Body Weight (IBW): 95 lbs (43 kg)       Current Body Weight: 54.6 kg (120 lb 5.9 oz), 126.7 % IBW. Weight Source: Bed scale  Current BMI (kg/m2): 24.3        Weight Adjustment For: No Adjustment                 BMI Categories: Normal Weight (BMI 18.5-24.9)    Wt Readings from Last 10 Encounters:    08/06/24 54.6 kg (120 lb 5.9 oz)   07/20/24 57.2 kg (126 lb 1.7 oz)   07/11/24 54.6 kg (120 lb 5.9 oz)   07/09/24 55.5 kg (122 lb 5.7 oz)   03/06/24 57.6 kg (127 lb)   02/21/24 58 kg (127 lb 13.9 oz)   11/28/23 52.2 kg (115 lb 1.3 oz)   10/31/23 52.6 kg (115 lb 15.4 oz)   10/21/23 55.2 kg (121 lb 11.1 oz)   10/12/23 60.4 kg (133 lb 3.2 oz)       Nutrition Diagnosis:   Inadequate protein-energy intake related to nausea/vomiting/diarrhea as evidenced by poor intake prior to admission    Nutrition Interventions:   Food and/or Nutrient Delivery: Modify Tube Feeding  Nutrition Education/Counseling: Education/Counseling initiated  Coordination of Nutrition Care: Continue to monitor while inpatient       Goals:  Previous Goal Met: No Progress toward Goal(s)  Goals: Initiate nutrition support, Tolerate nutrition support at goal rate, by next RD assessment       Nutrition Monitoring and Evaluation:   Behavioral-Environmental Outcomes: None Identified  Food/Nutrient Intake Outcomes: Enteral Nutrition Intake/Tolerance, Parenteral Nutrition Intake/Tolerance  Physical Signs/Symptoms Outcomes: Biochemical Data, Nausea or Vomiting, Weight    Discharge Planning:    Enteral Nutrition     Victory Ranger, RD  Available via PerfectServe    "

## 2024-08-16 NOTE — Progress Notes (Signed)
"  Again: this is a patient on chronic hemodialysis with an AVG. Her arm veins should be preserved for AV access.    The preferred site of central venous access is the internal jugular vein. PICC should be avoided.  "

## 2024-08-17 LAB — CBC WITH AUTO DIFFERENTIAL
Basophils %: 0.8 % (ref 0.0–1.0)
Basophils Absolute: 0.08 K/UL (ref 0.00–0.10)
Eosinophils %: 2.6 % (ref 0.0–7.0)
Eosinophils Absolute: 0.25 K/UL (ref 0.00–0.40)
Hematocrit: 39.9 % (ref 35.0–47.0)
Hemoglobin: 13.2 g/dL (ref 11.5–16.0)
Immature Granulocytes %: 1.3 % — ABNORMAL HIGH (ref 0.0–0.5)
Immature Granulocytes Absolute: 0.12 K/UL — ABNORMAL HIGH (ref 0.00–0.04)
Lymphocytes %: 16.2 % (ref 12.0–49.0)
Lymphocytes Absolute: 1.55 K/UL (ref 0.80–3.50)
MCH: 30.8 pg (ref 26.0–34.0)
MCHC: 33.1 g/dL (ref 30.0–36.5)
MCV: 93 FL (ref 80.0–99.0)
MPV: 9 FL (ref 8.9–12.9)
Monocytes %: 10.3 % (ref 5.0–13.0)
Monocytes Absolute: 0.99 K/UL (ref 0.00–1.00)
Neutrophils %: 68.8 % (ref 32.0–75.0)
Neutrophils Absolute: 6.6 K/UL (ref 1.80–8.00)
Nucleated RBCs: 0.4 /100{WBCs} — ABNORMAL HIGH
Platelets: 460 K/uL — ABNORMAL HIGH (ref 150–400)
RBC: 4.29 M/uL (ref 3.80–5.20)
RDW: 15.8 % — ABNORMAL HIGH (ref 11.5–14.5)
WBC: 9.6 K/uL (ref 3.6–11.0)
nRBC: 0.04 K/uL — ABNORMAL HIGH (ref 0.00–0.01)

## 2024-08-17 LAB — COMPREHENSIVE METABOLIC PANEL
ALT: <5 U/L — ABNORMAL LOW (ref 10–35)
AST: 33 U/L (ref 10–35)
Albumin/Globulin Ratio: 0.9 — ABNORMAL LOW (ref 1.1–2.2)
Albumin: 3.1 g/dL — ABNORMAL LOW (ref 3.5–5.2)
Alk Phosphatase: 170 U/L — ABNORMAL HIGH (ref 35–104)
Anion Gap: 12 mmol/L (ref 2–14)
BUN/Creatinine Ratio: 3 — ABNORMAL LOW (ref 12–20)
BUN: 17 mg/dL (ref 6–20)
CO2: 27 mmol/L (ref 20–29)
Calcium: 8.9 mg/dL (ref 8.6–10.0)
Chloride: 96 mmol/L — ABNORMAL LOW (ref 98–107)
Creatinine: 4.97 mg/dL — ABNORMAL HIGH (ref 0.60–1.00)
Est, Glom Filt Rate: 11 ml/min/1.73m2 — ABNORMAL LOW (ref >59)
Globulin: 3.4 g/dL (ref 2.0–4.0)
Glucose: 240 mg/dL — ABNORMAL HIGH (ref 65–100)
Potassium: 4.1 mmol/L (ref 3.5–5.1)
Sodium: 135 mmol/L — ABNORMAL LOW (ref 136–145)
Total Bilirubin: 0.4 mg/dL (ref 0.0–1.2)
Total Protein: 6.4 g/dL (ref 6.4–8.3)

## 2024-08-17 LAB — POCT GLUCOSE
POC Glucose: 293 mg/dL — ABNORMAL HIGH (ref 65–117)
POC Glucose: 182 mg/dL — ABNORMAL HIGH (ref 65–117)
POC Glucose: 140 mg/dL — ABNORMAL HIGH (ref 65–117)
POC Glucose: 195 mg/dL — ABNORMAL HIGH (ref 65–117)

## 2024-08-17 LAB — MAGNESIUM: Magnesium: 2 mg/dL (ref 1.6–2.6)

## 2024-08-17 LAB — PHOSPHORUS: Phosphorus: 2.7 mg/dL (ref 2.5–4.5)

## 2024-08-17 MED ORDER — METOCLOPRAMIDE HCL 5 MG/ML IJ SOLN
5 | Freq: Three times a day (TID) | INTRAMUSCULAR | Status: DC
Start: 2024-08-17 — End: 2024-08-21
  Administered 2024-08-17 – 2024-08-20 (×10): 5 mg via INTRAVENOUS

## 2024-08-17 MED ORDER — METOCLOPRAMIDE HCL 10 MG PO TABS
10 | Freq: Three times a day (TID) | ORAL | Status: DC
Start: 2024-08-17 — End: 2024-08-17

## 2024-08-17 MED ORDER — PANTOPRAZOLE SODIUM 40 MG PO TBEC
40 | Freq: Two times a day (BID) | ORAL | Status: DC
Start: 2024-08-17 — End: 2024-08-17

## 2024-08-17 MED ORDER — ONDANSETRON HCL 4 MG/2ML IJ SOLN
4 | Freq: Four times a day (QID) | INTRAMUSCULAR | Status: DC | PRN
Start: 2024-08-17 — End: 2024-08-20
  Administered 2024-08-17 – 2024-08-19 (×4): 4 mg via INTRAVENOUS

## 2024-08-17 MED ORDER — ONDANSETRON HCL 4 MG/5ML PO SOLN
4 | Freq: Four times a day (QID) | ORAL | Status: DC | PRN
Start: 2024-08-17 — End: 2024-08-20

## 2024-08-17 MED ORDER — METOCLOPRAMIDE HCL 5 MG/ML IJ SOLN
5 | Freq: Three times a day (TID) | INTRAMUSCULAR | Status: DC
Start: 2024-08-17 — End: 2024-08-17

## 2024-08-17 MED ORDER — SODIUM CHLORIDE (PF) 0.9 % IJ SOLN
0.9 | Freq: Two times a day (BID) | INTRAMUSCULAR | Status: DC
Start: 2024-08-17 — End: 2024-08-21
  Administered 2024-08-17 – 2024-08-20 (×6): 40 mg via INTRAVENOUS

## 2024-08-17 MED ORDER — ACETAMINOPHEN 500 MG PO TABS
500 | Freq: Four times a day (QID) | ORAL | Status: DC
Start: 2024-08-17 — End: 2024-08-18
  Administered 2024-08-17 – 2024-08-18 (×2): 1000 mg via ORAL

## 2024-08-17 MED ORDER — MORPHINE SULFATE (PF) 4 MG/ML IJ SOLN
4 | INTRAMUSCULAR | Status: DC | PRN
Start: 2024-08-17 — End: 2024-08-19
  Administered 2024-08-18 – 2024-08-19 (×8): 2 mg via INTRAVENOUS

## 2024-08-17 MED ORDER — BISACODYL 10 MG RE SUPP
10 | Freq: Every day | RECTAL | Status: DC | PRN
Start: 2024-08-17 — End: 2024-08-28
  Administered 2024-08-23: 21:00:00 10 mg via RECTAL

## 2024-08-17 MED ORDER — DICYCLOMINE HCL 10 MG PO CAPS
10 | Freq: Three times a day (TID) | ORAL | Status: DC
Start: 2024-08-17 — End: 2024-08-18
  Administered 2024-08-17 – 2024-08-18 (×2): 10 mg via ORAL

## 2024-08-17 MED FILL — MORPHINE SULFATE 4 MG/ML IJ SOLN: 4 mg/mL | INTRAMUSCULAR | Qty: 1 | Fill #0

## 2024-08-17 MED FILL — HEPARIN SODIUM (PORCINE) 5000 UNIT/ML IJ SOLN: 5000 [IU]/mL | INTRAMUSCULAR | Qty: 1

## 2024-08-17 MED FILL — CARVEDILOL 12.5 MG PO TABS: 12.5 mg | ORAL | Qty: 2 | Fill #0

## 2024-08-17 MED FILL — SEVELAMER CARBONATE 0.8 G PO PACK: 0.8 g | ORAL | Qty: 1 | Fill #0

## 2024-08-17 MED FILL — HYDRALAZINE HCL 25 MG PO TABS: 25 mg | ORAL | Qty: 2 | Fill #0

## 2024-08-17 MED FILL — VITAMIN B-6 50 MG PO TABS: 50 mg | ORAL | Qty: 1

## 2024-08-17 MED FILL — PROCHLORPERAZINE EDISYLATE 10 MG/2ML IJ SOLN: 10 MG/2ML | INTRAMUSCULAR | Qty: 2 | Fill #0

## 2024-08-17 MED FILL — METOCLOPRAMIDE HCL 5 MG/ML IJ SOLN: 5 mg/mL | INTRAMUSCULAR | Qty: 2 | Fill #0

## 2024-08-17 MED FILL — DICYCLOMINE HCL 10 MG PO CAPS: 10 mg | ORAL | Qty: 1 | Fill #0

## 2024-08-17 MED FILL — ATORVASTATIN CALCIUM 20 MG PO TABS: 20 mg | ORAL | Qty: 4 | Fill #0

## 2024-08-17 MED FILL — CEFAZOLIN SODIUM 1 G IJ SOLR: 1 g | INTRAMUSCULAR | Qty: 1000 | Fill #0

## 2024-08-17 MED FILL — ONDANSETRON HCL 4 MG/5ML PO SOLN: 4 MG/5ML | ORAL | Qty: 5 | Fill #0

## 2024-08-17 MED FILL — ALBUMIN HUMAN 25 % IV SOLN: 25 % | INTRAVENOUS | Qty: 50 | Fill #0

## 2024-08-17 MED FILL — INSULIN LISPRO 100 UNIT/ML IJ SOLN: 100 [IU]/mL | INTRAMUSCULAR | Qty: 4

## 2024-08-17 MED FILL — HEPARIN SODIUM (PORCINE) 5000 UNIT/ML IJ SOLN: 5000 [IU]/mL | INTRAMUSCULAR | Qty: 1 | Fill #0

## 2024-08-17 MED FILL — SODIUM BICARBONATE 650 MG PO TABS: 650 mg | ORAL | Qty: 1 | Fill #0

## 2024-08-17 MED FILL — MORPHINE SULFATE 4 MG/ML IJ SOLN: 4 mg/mL | INTRAMUSCULAR | Qty: 1

## 2024-08-17 MED FILL — VITAMIN B-6 50 MG PO TABS: 50 mg | ORAL | Qty: 1 | Fill #0

## 2024-08-17 MED FILL — ONDANSETRON HCL 4 MG/2ML IJ SOLN: 4 MG/2ML | INTRAMUSCULAR | Qty: 2 | Fill #0

## 2024-08-17 MED FILL — METOCLOPRAMIDE HCL 10 MG PO TABS: 10 mg | ORAL | Qty: 1 | Fill #0

## 2024-08-17 MED FILL — PANTOPRAZOLE SODIUM 40 MG IV SOLR: 40 mg | INTRAVENOUS | Qty: 40 | Fill #0

## 2024-08-17 MED FILL — RETACRIT 10000 UNIT/ML IJ SOLN: 10000 [IU]/mL | INTRAMUSCULAR | Qty: 1 | Fill #0

## 2024-08-17 MED FILL — BUSPIRONE HCL 10 MG PO TABS: 10 mg | ORAL | Qty: 1 | Fill #0

## 2024-08-17 MED FILL — PANTOPRAZOLE SODIUM 40 MG PO TBEC: 40 mg | ORAL | Qty: 1 | Fill #0

## 2024-08-17 MED FILL — ACETAMINOPHEN EXTRA STRENGTH 500 MG PO TABS: 500 mg | ORAL | Qty: 2 | Fill #0

## 2024-08-17 MED FILL — ALBUMIN HUMAN 25 % IV SOLN: 25 % | INTRAVENOUS | Qty: 100 | Fill #0

## 2024-08-17 MED FILL — SENNOSIDES-DOCUSATE SODIUM 8.6-50 MG PO TABS: 8.6-50 mg | ORAL | Qty: 1 | Fill #0

## 2024-08-17 NOTE — Progress Notes (Signed)
 "                                                                                                 Vernell Arrow, PA-C                       [phone] office             Monday-Friday 8:00 am-4:30 pm  I am not permitted to use perfect serve use above for contact, thanks.        Gastroenterology Progress Note    August 17, 2024  Admit Date: 08/06/2024         Narrative Assessment and Plan     Impression:  Intractable N/v in the setting of gastroparesis with GJ tubes.  I expect her n/v is multifactorial. She is on IV ABX for MSSA bacteremia, she is a renal patient on HD and has polypharmacy.  Minimal relief with antiemetics and prokinetics.  Will change PPI to BID IV and TID IV reglan  10mg .  Hold TFS for now and consider resuming tomorrow at slow rate with IV motility/antiemetics. NO indication for inpatient endoscopy at this time.  Consider dulcolax suppository given no BM in almost one week.      Vernell Jacques Arrow, PA-C    Attending:  Her symptoms of nausea and vomiting are chronic and led to Jejunostomy tube placement.  Most recent imaging shows the tube to be well past the ligament of treitz and as such feeds will likely progress and provide nutrition even if she is emptying her stomach.  In regards the symptoms of n/v, laxation might assist.  I would encourage return to enteral feeds even if she is vomiting, as this week the emesis did not have any evidence of tube feeding formula contained within.  Lonni Pais, MD     Subjective:   Reason for visit: intractable n/v    HPI: Asked to come back and see [name] due to ongoing n/v and difficulty keeping food/liquids down.  Tfs were adjusted to continuous feeds at slow rate without significant improvement in symptoms.  Dtrs in room while pt in HD reporting she intermittently uses her Tfs at home when she feels her PO tolerance is poor.  The pt was seen on HD with the use of spanish interpretor over the phone who tells me she is vomiting  clear liquid, not Tfs and cannot really associate her n/v with the TF infusion via Jtube. She has been on scheduled 5mg  reglan  TID, daily PPI, BID Pepcid , scopalamine patch, prn compazine , prn zofran  . She does have a prolonged QT. EKGS are being monitored. She has not been evaluated for gastric pacemaker or pylorplasty per her report.  She denies any THC. She is not on any narcotics or anticholinergics.  She had not had a BM in almost one week. Denies feeling the need to go.  She is receiving senna BID. Her abdominal imaging this admission reassuring and she had a CT head over the summer.  No central process.   VS fluctuating, at times as  high as 300 but mostly in the 150+200 range. EGD last year reassuring.      ROS:  The previous review of systems on initial consultation / H&P is noted and reviewed.  Specific changes noted above in HPI.    Current Medications:     Current Facility-Administered Medications   Medication Dose Route Frequency    metoclopramide  (REGLAN ) injection 10 mg  10 mg IntraVENous TID AC    pantoprazole  (PROTONIX ) 40 mg in sodium chloride  (PF) 0.9 % 10 mL injection  40 mg IntraVENous Q12H    NIFEdipine  (PROCARDIA  XL) extended release tablet 90 mg  90 mg Oral Daily    ondansetron  (ZOFRAN ) 4 MG/5ML solution 4 mg  4 mg Per G Tube Q6H PRN    [Held by provider] epoetin  alfa-epbx (RETACRIT ) injection 10,000 Units  10,000 Units SubCUTAneous Q MWF    aspirin  chewable tablet 81 mg  81 mg Per G Tube Daily    atorvastatin  (LIPITOR ) tablet 80 mg  80 mg Per G Tube QHS    busPIRone  (BUSPAR ) tablet 5 mg  5 mg Per G Tube BID    clopidogrel  (PLAVIX ) tablet 75 mg  75 mg Per G Tube Daily    DULoxetine  (CYMBALTA ) extended release capsule 60 mg  60 mg Per G Tube Daily    famotidine  (PEPCID ) tablet 20 mg  20 mg Per G Tube Daily    hydrALAZINE  (APRESOLINE ) tablet 50 mg  50 mg Per G Tube TID    sennosides-docusate sodium  (SENOKOT-S) 8.6-50 MG tablet 1 tablet  1 tablet Per G Tube BID    vitamin B-6 (PYRIDOXINE ) tablet  25 mg  25 mg Per G Tube Q6H    ceFAZolin  (ANCEF ) 1,000 mg in sterile water  10 mL IV syringe  1,000 mg IntraVENous Daily    sodium chloride  flush 0.9 % injection 5-40 mL  5-40 mL IntraVENous 2 times per day    sodium chloride  flush 0.9 % injection 5-40 mL  5-40 mL IntraVENous PRN    0.9 % sodium chloride  infusion   IntraVENous PRN    carvedilol  (COREG ) tablet 25 mg  25 mg Per G Tube BID WC    scopolamine  (TRANSDERM-SCOP) transdermal patch 1 patch  1 patch TransDERmal Q72H    albumin  human 25% IV solution 25 g  25 g IntraVENous PRN    prochlorperazine  (COMPAZINE ) injection 10 mg  10 mg IntraVENous Q6H PRN    0.9 % sodium chloride  infusion   IntraVENous PRN    heparin  (porcine) injection 5,000 Units  5,000 Units SubCUTAneous 3 times per day    polyethylene glycol (GLYCOLAX ) packet 17 g  17 g Oral Daily PRN    acetaminophen  (TYLENOL ) tablet 650 mg  650 mg Oral Q6H PRN    Or    acetaminophen  (TYLENOL ) suppository 650 mg  650 mg Rectal Q6H PRN    sevelamer  (RENVELA ) packet 0.8 g  0.8 g Oral TID WC    sodium bicarbonate  tablet 650 mg  650 mg Oral TID    oxyCODONE  (ROXICODONE ) immediate release tablet 5 mg  5 mg Oral Q4H PRN    Or    oxyCODONE  HCl (OXY-IR) immediate release tablet 10 mg  10 mg Oral Q4H PRN    morphine  sulfate (PF) injection 2 mg  2 mg IntraVENous Q2H PRN    insulin  lispro (HUMALOG ,ADMELOG ) injection vial 0-8 Units  0-8 Units SubCUTAneous 4x Daily AC & HS       Objective:     VITALS:  Last 24hrs VS reviewed since prior progress note. Most recent are:  Vitals:    08/17/24 1315   BP: 114/68   Pulse: 93   Resp:    Temp:    SpO2:      Temp (24hrs), Avg:98.4 F (36.9 C), Min:98.2 F (36.8 C), Max:98.6 F (37 C)        Intake/Output Summary (Last 24 hours) at 08/17/2024 1339  Last data filed at 08/16/2024 2200  Gross per 24 hour   Intake --   Output 500 ml   Net -500 ml       General: sleepy, chronically ill appearing  Head: Normocephalic, without obvious abnormality, atraumatic  Eyes: anicteric sclerae and  conjuntiva clear  Lungs: clear to auscultation with good breath sounds and normal respiratory effort  Abd: generalized gut discomfort to palpation   Ext: no cyanosis and no edema  Skin: normal skin color, no rashes, and texture normal  Neuro:  Alert, oriented x 4, Nonfocal exam, and Cranial nerves 3-12 grossly intact  Psych: not anxious, cooperative, appropriate affect     Lab Data Reviewed:   Recent Labs     08/15/24  0403 08/16/24  0511 08/17/24  0354   WBC 6.3 6.6 9.6   HGB 10.5* 12.2 13.2   HCT 33.4* 37.4 39.9   PLT 383 404* 460*     Recent Labs     08/15/24  0403 08/16/24  0511 08/17/24  0354   NA 138 140 135*   K 3.8 3.6 4.1   CL 97* 99 96*   CO2 24 24 27    BUN 16 10 17    MG 1.9  --  2.0   PHOS 4.0  --  2.7   ALT <5* <5* <5*     No results found for: GLUCPOC  No results for input(s): PH, PCO2, PO2, HCO3, FIO2 in the last 72 hours.  No results for input(s): INR in the last 72 hours.        Assessment:   (See above)  Principal Problem:    End-stage renal disease on hemodialysis [facility])  Active Problems:    Gastroparesis    Hypertensive emergency    MSSA bacteremia  Resolved Problems:    ESRD (end stage renal disease) (HCC)      Plan:   (See above)      Signed By: Vernell Jacques Arrow, PA-C     08/17/2024  1:39 PM         "

## 2024-08-17 NOTE — Progress Notes (Signed)
 "        [facility]                                                                                          Hospitalist Progress Note  Prentice MALVA Flemings, MD  Office Phone: [phone]        Date of Service:  08/17/2024  NAME:  Melinda [name]  DOB:  [birth year]  MRN:  [id number]     HPI:   11/6  Nauseous and having yellow emesis before and after tube feeds have been started today, no change. Feels that her nausea and emesis have improved since the beginning of the admission, when it was the worst.    11/7  Patient continues to have emesis.  Bedside nurse and patient agree that emesis is unrelated to whether tube feeds are on or off.     Assessment & Plan:     43 year old female with ESRD, type 2 diabetes, hypertension, admitted with acute on chronic vomiting and abdominal pain likely secondary to gastroparesis.    Gastroparesis with emesis abdominal pain, status post GJ tube  - Per GI note, patient has intractable vomiting that is difficult to treat, but should continue to use her GJ tube  - Holding off on catheter for long-term TPN in an effort to maintain nutrition through tube feeds  - pt requested oral diet  - PPI and Pepcid   - Zofran  and Reglan   - Scopolamine  patch  - bowel reg/suppository  - hold TF today and plan to restart 11/8 AM    Sepsis secondary to MSSA bacteremia  - Per ID, cefazolin  2 g on HD days through 09/19/2024    Prolonged QT  - Monitor EKG every other day on antiemetics    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  POA  - Phosphorus downtrending to normal continue sevelamer       Fentanyl  positive: Seen on drug screen.  May be a false positive in the setting of Benadryl  use.  Patient adamantly denies use.     Hypertension POA. Improved  Will continue patient's home Coreg /hydralazine /amlodipine   -Increased Coreg   - Avoid as needed and single dose IV antibiotics since associated with high risk of various forms of ischemia     ESRD  On  HD  Nephrology following     GERD: Chronic.    Continue Pepcid  and pantop     T2DM with gastroparesis. POA, chronic.  Last A1c of 6.1 on 9/17.    -Sliding scale insulin .      Chronic pain or anxiety/depression  -Duloxetine  and BuSpar .     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 Mesenteric MRA 01/22/24 with proximally and distally patent SMA and diffuse anasarca.   - will continue plavix  and ASA     Anemia: Trend.  Follow-up outpatient       Code status:   Full Code  DVT Prophylaxis: heparin   Anticipated Disposition: home once symptoms improved  Review of Systems:   Pertinent symptoms noted in the HPI    Vital Signs:    Last 24hrs VS reviewed since prior progress note. Most recent are:  Vitals:    08/17/24 1419   BP: (!) 184/99   Pulse: (!) 103   Resp: 17   Temp: 98.2 F (36.8 C)   SpO2: 100%       Physical Examination:     I had a face to face encounter with this patient and independently examined them on 08/17/2024 as outlined below:        General : alert and awake, no acute distress  HEENT: moist mucus membranes  Chest: CTAB, normal WOB  CVS: S1 and S2 heard, no m/g/r  Abd: non distended. Mild TTP  Ext: WWP, no edema  Neuro/Psych: no focal deficits  Skin: no rash      Labs and imaging:     Recent Labs     08/16/24  0511 08/17/24  0354   WBC 6.6 9.6   HGB 12.2 13.2   HCT 37.4 39.9   PLT 404* 460*     Recent Labs     08/15/24  0403 08/16/24  0511 08/17/24  0354   NA 138 140 135*   K 3.8 3.6 4.1   CL 97* 99 96*   CO2 24 24 27    BUN 16 10 17    MG 1.9  --  2.0   PHOS 4.0  --  2.7     Recent Labs     08/15/24  0403 08/16/24  0511 08/17/24  0354   ALT <5* <5* <5*   GLOB 2.9 3.2 3.4     No results for input(s): INR, APTT in the last 72 hours.    Invalid input(s): PTP   No results for input(s): TIBC in the last 72 hours.    Invalid input(s): FE, PSAT, FERR   No results found for: RBCF   No results for input(s): PH, PCO2,  PO2 in the last 72 hours.  No results for input(s): CPK in the last 72 hours.    Invalid input(s): CPKMB, CKNDX, TROIQ  Lab Results   Component Value Date/Time    CHOL 192 08/07/2024 11:19 AM    HDL 80 08/07/2024 11:19 AM    LDL 78 08/07/2024 11:19 AM    LDL Not calculated due to elevated triglyceride level 09/16/2022 10:30 AM     No results found for: GLUCPOC  @LABUA @      US  EXTREMITY LEFT NON VASC LIMITED   Final Result   An ill-defined, heterogeneously hypoechoic mass anterior to the AV fistula graft   measures 6 x 12 x 16 mm. Internal blood flow cannot be determined due to   adjacent arterial flow causing artifact. Phlegmon or old hematoma are possible.   It is not a circumscribed, walled off abscess. The patient reports that this has   been palpable for 2 years, favoring old hematoma.         Electronically signed by Medford Manners      MRI LUMBAR SPINE W WO CONTRAST   Final Result      Degenerative changes are conspicuous at the cervical spine.      Moderate to severe canal stenosis at C4-5 and moderate canal stenosis at C3-C4.      No other canal or foraminal compromise is identified.      There is no fracture or dislocation.      There is no  cord signal abnormality or abnormal intrathecal enhancement. No   evidence of discitis/osteomyelitis.                     Electronically signed by JIMMY RABON      MRI THORACIC SPINE W WO CONTRAST   Final Result      Degenerative changes are conspicuous at the cervical spine.      Moderate to severe canal stenosis at C4-5 and moderate canal stenosis at C3-C4.      No other canal or foraminal compromise is identified.      There is no fracture or dislocation.      There is no cord signal abnormality or abnormal intrathecal enhancement. No   evidence of discitis/osteomyelitis.                     Electronically signed by JIMMY RABON      MRI CERVICAL SPINE W WO CONTRAST   Final Result      Degenerative changes are conspicuous at the cervical spine.       Moderate to severe canal stenosis at C4-5 and moderate canal stenosis at C3-C4.      No other canal or foraminal compromise is identified.      There is no fracture or dislocation.      There is no cord signal abnormality or abnormal intrathecal enhancement. No   evidence of discitis/osteomyelitis.                     Electronically signed by JIMMY HABIB      CT Upper Extremity Left W Contrast   Final Result   Nonspecific 2 cm area of subcutaneous attenuation anterior to the   distal graft anastomosis. A defined collection is not shown. Further evaluation   with ultrasound at the distal anastomotic site of the graft is recommended to   exclude small abscess.         Electronically signed by Alm Edman, MD      XR CHEST PORTABLE   Final Result   No acute cardiopulmonary process.      Electronically signed by Harden Cahill      CT ABDOMEN PELVIS WO CONTRAST Additional Contrast? None   Final Result   No acute intraperitoneal process is identified.   Gastrojejunostomy.   Colonic diverticulosis with no diverticulitis.   Incidental and/or nonemergent findings are as described in detail above.         Electronically signed by JIMMY HABIB          08/06/24    ECHO (TTE) COMPLETE (PRN CONTRAST/BUBBLE/STRAIN/3D) 08/12/2024  1:21 PM (Final)    Interpretation Summary    Left Ventricle: Low normal left ventricular systolic function. EF by visual approximation is 55%. Left ventricle size is normal. Findings consistent with moderate concentric hypertrophy. Normal wall motion. Diastolic dysfunction present with normal LV EF.    Right Ventricle: Right ventricle size is normal. Normal systolic function.    Aortic Valve: Mildly calcified cusps. Mild sclerosis of the aortic valve cusps.    Mitral Valve: There is mild annular calcification noted.    Image quality is good.    Signed by: Ephriam MARLA Query, MD on 08/12/2024  1:21 PM        Level of Service Documentation     In caring for this patient today, I reviewed recent notes,  interpreted lab results and imaging tests, had a face-to-face encounter with the patient, performing the  medically necessary appropriate exam and history, counseled the patient/family/caregiver, ordered medications, tests and procedures as indicated for diagnostic and therapeutic purposes, consulted and communicated with other healthcare professionals including care coordination and discharge planning, and documented clinical information in the electronic health record.  ______________________________________________________________________                 Prentice MALVA Flemings, MD     "

## 2024-08-17 NOTE — Consults (Signed)
"  PSYCHIATRY CONSULT NOTE    REASON FOR CONSULT: Intractable nausea and vomiting -- staff suspect possible self-induction      HISTORY OF PRESENTING COMPLAINT:  Melinda [name] is a 43 y.o. Hispanic / Latino female who is currently admitted to [facility].     The patient is a 43 year old Hispanic female being evaluated for intractable nausea and vomiting (N/V), ongoing for several days. Staff raised concern for possible self-induced vomiting; however, the patient firmly denies any self-inductive behavior. She describes persistent nausea with vomiting occurring shortly after eating, stating, anything I eat comes right back up. Symptoms have been severe enough to affect her daily functioning and contribute to fatigue and emotional distress.    Associated symptoms include sadness and anxiety, which the patient attributes to ongoing medical stressors, including elevated blood pressure, high cholesterol, and prolonged hospitalization (11 days). She denies suicidal ideation, homicidal ideation, hallucinations, or delusions.  The patient reports no prior psychiatric diagnoses or treatment history. She denies current or past substance use and is not on any psychiatric medications.     Socially, the patient lives with her husband and three daughters (ages 6, 88, and 69) and describes supportive family relationships. She previously worked on a food truck prior to hospitalization. No family psychiatric history was reported.  Overall, the patients emotional distress appears to be situational, secondary to prolonged illness and hospitalization, rather than reflective of a primary psychiatric disorder.    Spanish is her primary language; interpreter services were used throughout the evaluation to ensure accurate communication.    MSE:  Appearance/Distress: Appears stated age.  Behavior: Cooperative and appropriate; maintains engagement via interpreter.  Motor Activity: Normal, no psychomotor agitation or retardation  observed.  Mood/Affect: Reports feeling sad and anxious; affect congruent with mood and situation.  Speech/Thought: Speech clear, logical, goal-directed; denies delusions, hallucinations, or obsessions.  Cognition: Alert and oriented 4; intact attention, memory, and concentration.  Judgment/Insight: Demonstrates good understanding of illness; insight appropriate.  Risk Assessment: Denies SI/HI; no dangerousness or psychosis observed.    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 No evidence of self-induced vomiting, eating disorder, or psychotic process. No prior psychiatric history or substance use noted.    Plan  Psychological Support:  Recommend referral to a Spanish-speaking therapist for supportive psychotherapy to help manage situational stress, medical anxiety, and coping with hospitalization.    Encourage use of relaxation and deep breathing techniques for anxiety reduction.    Medication Management:    Encourage ongoing family support during hospitalization and recovery.    Psych is signing out of this consult.    Thank you for your consult. Please feel free to consult us  again as needed.     Lesley Curly. DNP, PMHNP-BC    "

## 2024-08-17 NOTE — Consults (Signed)
 Session ID: [id number]  Session Duration: 24 minutes  Language: Spanish  Interpreter ID: #[id number]  Interpreter Name: Lael

## 2024-08-17 NOTE — Care Coordination (Signed)
"  Care Management Progress Note        Reason for Admission:   Hypertensive emergency [I16.1]  Intractable nausea and vomiting [R11.2]         Patient Admission Status: Inpatient  RUR: 40%  Hospitalization in the last 30 days (Readmission):  No        Transition of care plan:  Patient was discussed in IDR and continues to be medically managed.     Dispo: return home with family.    HD: CM has faxed clinical updates and HD flowsheet to [facility] at [phone]. CM has provided Arlene at [facility] an update at [phone]. [facility] is anticipating patient in clinic on Monday 08/20/24.    Discharge plan communicated with patient and/or discharge caregiver: Yes      Date 1st IMM letter given: N/a. Patient has a comptroller.     Outpatient follow-up.    Transport at discharge: family.        ___________________________________________   Joan Pond, RN Case Manager  08/17/2024   3:56 PM       "

## 2024-08-17 NOTE — Flowsheet Note (Signed)
 "Primary RN SBAR: Gladis Harlene POUR, RN  Patient Education provided: Procedural   Preferred Education method and Primary language: English/verbal  Dialysis consent: yes  Hospital General Consent Verified: yes  Hospital associated wait time; reason: transport 1hr  Hepatitis B Surface Ag   Date/Time Value Ref Range Status   08/07/2024 01:38 AM NONREACTIVE NR   Final     Hep B S Ag Interp   Date/Time Value Ref Range Status   03/16/2023 08:19 AM Negative NEG   Final     Hep B S Ab   Date/Time Value Ref Range Status   08/07/2024 01:38 AM <3.50 mIU/mL Final     Comment:        <10.00 mIU/mL     Non-Immune  =>10.00 mIU/mL    Individual is considered to be immune to infection with HBV       Hep B results, dates, and Primary Source: Negative/ susceptible 08/07/2024 hospital Epic  Hep B dual verification performed by (RN):Taylor, Arland, RN  Machine disinfect process: Acid/ heat       08/17/24 1030   Treatment   Treatment Goal 3H/ 2-3L   Heb B Surface Ag Negative   Date - Heb B Surface Ag 08/07/24   Heb B Surface Ab <10   Date - Heb B Surface Ab 08/07/24   Observations & Evaluations   Level of Consciousness 0   Oriented X x4   Heart Rhythm Regular   Respiratory Quality/Effort Unlabored   O2 Device None (Room air)   Skin Color Ecchymosis (comment)   Skin Condition/Temp Dry;Warm   Appetite Nausea   Abdomen Inspection Soft   Last BM (including prior to admit) 08/13/24   Edema Generalized   RUE Edema Trace   Vital Signs   BP (!) 184/97   Temp 98.3 F (36.8 C)   Pulse 88   Respirations 17   SpO2 99 %   Pain Assessment   Pain Assessment 0-10   Pain Level 8   Patient's Stated Pain Goal 0 - No pain   Pain Location Abdomen   Pain Orientation Mid   Pain Descriptors Aching   Technical Checks   Dialysis Machine No. [id number]   RO Machine Number [id number]   Dialyzer Lot No. [id number]   Tubing Lot Number [id number]   All Connections Secure Yes   NS Bag Yes   Saline Line Double Clamped Yes   Dialyzer F-180   Prime Volume (mL) 250 mL   ICEBOAT  I;C;E;B;O;A;T   RO Machine Log Sheet Completed Yes   Machine Alarm Self Test Completed;Passed   Child Psychotherapist Function;pH Reading   Sport And Exercise Psychologist Conductivity 14.0   Manual Ph 7.4   Bleach Test (Neg) Yes   Bath Temperature 98.6 F (37 C)   Hemodialysis Fistula/Graft Arteriovenous fistula Left Arm   No placement date or time found.   Present on Admission/Arrival: Yes  Access Type: Arteriovenous fistula  Orientation: Left  Access Location: Arm   Site Assessment Clean, dry & intact   Thrill Present   Bruit Present   Status Accessed   Venous Needle Size 15 G   Arterial Needle Size 15 G   Accessed ByBETHA Maas, CHRISTELLA. RN   Access Attempts  1   Access Interventions Needles taped to patient;Chlorhexidine   Dressing Status Clean, dry & intact   Dialysis Bath   K+ (Potassium) 3   Ca+ (Calcium ) 2.5   Na+ (Sodium) 138  HCO3 (Bicarb) 38   Bicarbonate Concentrate Lot No. [id number]   Acid Concentrate Lot No. [id number]   Handoff   Communication Given   (Pre HD)   Handoff Given To Ladell BATTLE RN   Handoff Received From Gladis Harlene POUR, RN   Handoff Communication Telephone     Each access site disinfected for 60 seconds per site with alcohol swabs per P&P. Cannulated with 15G needles x2 and secured with paper tape.   +aspiration/+flushed.     Patient's consent, code status, medications checked.                      Safety checks complete, time out performed.   assessed, +bruit/thrill, no redness, warmth or drainage noted. Skin prepped per procedure using chloraprep scrub x 30 sec, followed by 30 sec dry time each site. Cannulated using 15g needles each site and secured with tape. +flash/aspiration/flush. HD initiated. Access visible, lines secure. Medications reviewed.        08/17/24 1038   During Hemodialysis Assessment   BP (!) 177/94   Pulse 85   Blood Flow Rate (ml/min) 350 ml/min   Arterial Pressure (mmHg) -150 mmHg   Venous Pressure (mmHg) 150   TMP 10   DFR 500    Comments   (HD initiated)   Access Visible Yes   Ultrafiltration Rate (ml/hr) 830 ml/hr   Ultrafiltration Removed (ml) 0 ml      08/17/24 1230   During Hemodialysis Assessment   BP (!) 166/89   Pulse 98   Blood Flow Rate (ml/min) 400 ml/min   Arterial Pressure (mmHg) -200 mmHg   Venous Pressure (mmHg) 200   TMP 10   DFR 600   Comments   (reports  nausea/abdomila pain intensity 10, primary RN informed)   Access Visible Yes   Ultrafiltration Rate (ml/hr) 830 ml/hr   Ultrafiltration Removed (ml) 1552 ml      08/17/24 1338   During Hemodialysis Assessment   BP (!) 160/89   Pulse 93   Blood Flow Rate (ml/min) 400 ml/min   Arterial Pressure (mmHg) -200 mmHg   Venous Pressure (mmHg) 190   TMP 10   DFR 600   Comments   (HD completed)   Ultrafiltration Rate (ml/hr) 830 ml/hr   Ultrafiltration Removed (ml) 2500 ml     POST HD       08/17/24 1356   Treatment   Time Off 1338   Observations & Evaluations   Level of Consciousness 0   Oriented X x4   Heart Rhythm Regular   Respiratory Quality/Effort Unlabored   O2 Device None (Room air)   Skin Condition/Temp Dry;Warm   Appetite Nausea   Abdomen Inspection Soft;Rounded   Vital Signs   BP (!) 164/96   Temp 98.1 F (36.7 C)   Pulse 90   Respirations 17   SpO2 98 %   Pain Assessment   Pain Assessment 0-10   Pain Level 7   Patient's Stated Pain Goal 0 - No pain   Pain Location Abdomen   Pain Orientation Mid   Pain Descriptors Aching   Hemodialysis Fistula/Graft Arteriovenous fistula Left Arm   No placement date or time found.   Present on Admission/Arrival: Yes  Access Type: Arteriovenous fistula  Orientation: Left  Access Location: Arm   Site Assessment Clean, dry & intact   Thrill Present   Bruit Present   Status Deaccessed   Post-Hemodialysis Assessment   Post-Treatment Procedures Blood returned;Access bleeding  time < 10 minutes   Machine Disinfection Process Acid/Vinegar Clean;Heat Disinfect   Rinseback Volume (ml) 250 ml   Blood Volume Processed (Liters) 65.2 L   Dialyzer  Clearance Lightly streaked   Duration of Treatment (minutes) 180 minutes   Hemodialysis Intake (ml) 500 ml   Hemodialysis Output (ml) 2500 ml   NET Removed (ml) 2000   Tolerated Treatment Good   Patient Response to Treatment tolerated well   Patient Disposition Return to room   Handoff   Communication Given   (POST HD)   Handoff Given To Martin, Jessica K, RN   Handoff Received From Ladell Hollering, RN   Handoff Communication Telephone     HD treatment complete. All possible blood returned to the patient. De-cannulated and pressure held until hemostasis was achieved. No bleeding noted.   Dressing applied. +bruit/thrill.   Comments: Patient tolerated treatment without incident, UF goal achieved. At end of treatment patient is awake and alert, VSS. Needle limbs cleaned, all possible blood rinsed back to patient, limbs flushed with 10cc NS. Needles removed, manual pressure applied over puncture sites with clean dry folded 2x2 for approximately five minutes, hemostasis achieved. Puncture sites covered with clean dry folded 2x2, secured with tape. Bed wheels locked, bed in low position, call light in reach.   Comfort and care rendered, needs attended, questions answered.  Call bell within reach, bed in lowest position.  Report given to primary RN            "

## 2024-08-17 NOTE — Dialysis (Signed)
"  Hep B verification performed for (RN): M.Cedillo, RN  Hep B results, dates, and Primary Source: Neg/Sus 08/07/2024  EPIC  Hepatitis B Surface Ag   Date/Time Value Ref Range Status   08/07/2024 01:38 AM NONREACTIVE NR   Final     Hep B S Ag Interp   Date/Time Value Ref Range Status   03/16/2023 08:19 AM Negative NEG   Final     Hep B S Ab   Date/Time Value Ref Range Status   08/07/2024 01:38 AM <3.50 mIU/mL Final     Comment:        <10.00 mIU/mL     Non-Immune  =>10.00 mIU/mL    Individual is considered to be immune to infection with HBV       Machine disinfect process:Heat  "

## 2024-08-17 NOTE — BSMART Note (Signed)
 "Initial BSMART Liaison Assessment Form     Section I - Integrated Summary    Reason for consult is: intractable nausea and vomiting. Staff suspects may be self induced or behavioral contribution     LOS:  11     Presenting problem/Summary:      Pt was seen face to face on the medical floor.  Interpretor services utilized  Session code# 61464  Claudia Revels [id number]   Language : Spanish     Pt was laying in bed with her eyes closed. When greeted pt woke up. Pt presented to ED with vomiting and back pain. Pt reported no past mental health illness. She stated she has anxiety at times but she doesn't take any medications. Pt reported no previous psychiatric hospitalizations.  Pt denies any substance use.     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 Pt reported she has been dealing with medical problems and that has been making her feel sad.   Pt was alert and oriented. Pt was calm and cooperative.     Precipitant Factors are none reported .    The information is given by the patient.  Current Psychiatrist and/or Case Manager is none reported .  Previous Hospitalizations/Treatment: none reported     Plan: Defer to the most recent psychiatric consult note for recommendations. Liaison will follow up and support as needed     Lethality Assessment:  The potential for suicide is not noted.    The potential for homicide is not noted.  The patient has not been a perpetrator of sexual or physical abuse.    There are not pending charges.  The patient is not felt to be at risk for self-harm or harm to others.      Section II - Psychosocial  The patient's overall mood and attitude is sad.  Feelings of helplessness and hopelessness are not observed.  Generalized anxiety is not observed.  Panic is not observed. Phobias are not observed.  Obsessive compulsive tendencies are not observed.      Section III - Mental Status Exam  The patient's appearance shows no evidence of  impairment.  The patient's behavior shows no evidence of impairment. The patient is oriented to time, place, person and situation.  The patient's speech shows no evidence of impairment.  The patient's mood is sad.  The range of affect shows no evidence of impairment.  The patient's thought content demonstrates no evidence of impairment.  The thought process shows no evidence of impairment.  The patient's perception shows no evidence of impairment. The patient's memory shows no evidence of impairment.  The patient's appetite shows no evidence of impairment.  The patient's sleep shows no evidence of impairment. The patient's insight shows no evidence of impairment.  The patient's judgement shows no evidence of impairment.      The patient has demonstrated mental capacity to provide informed consent.    Section IV - Substance Abuse  The patient is not using substances.  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  The patient lives with spouse. The patient has 3 children, ages 70, 74, 65.  The patient does plan to return home upon discharge. The patient's source of income comes from family.    The patient's greatest support comes from family  and this person will be involved with the treatment. The patient has not been in an event described as horrible or outside the realm of ordinary life experience either currently or in the past. The patient has not been a victim of sexual/physical abuse.    Section VII - Other Areas of Clinical Concern    The highest grade achieved is 3rd grade  with the overall quality of school experience being described as n/a. The patient is currently unemployed and speaks Spanish as a  primary language.  The patient has no communication impairments affecting communication. The patient's preference for learning can be described as: can read and write adequately.  The patient's hearing is normal.  The patient's vision is normal.    The patient reports coping skills include:     Hart LOISE Hoyles, LCSW  "

## 2024-08-17 NOTE — Progress Notes (Signed)
 "Comprehensive Nutrition Assessment    Type and Reason for Visit: Reassess    Nutrition Recommendations/Plan:     Continue TF order per below:   - Nepro @ 35 mL/hour  - FWF 75 mL q 2 hours       Malnutrition Assessment:  Malnutrition Status:  Insufficient data (08/07/24 1316)    Context:  Chronic Illness     Findings of the 6 clinical characteristics of malnutrition:  Energy Intake:  Mild decrease in energy intake  Weight Loss:  Mild weight loss     Body Fat Loss:  Unable to assess     Muscle Mass Loss:  Unable to assess    Fluid Accumulation:  No fluid accumulation    Grip Strength:  Not Performed     Nutrition Assessment:    43 yo female admitted for HTN emergency, N/V. Pmhx: DM, Gastroparesis, HTN, ESRD - on HD, GJ tube.      11.07: Follow Up. TF ran yesterday at 35 mL/hr goal - stopped overnight after emesis. This occurred after diet was advanced to regular per pt request and a full meal was eaten. Likely too ambitious after NPO x7 days. Given jejunum placement of TF, I doubt it alone caused emesis especially at low rate of feeds. Was likely combination of full meal + TF ISO of gastroparesis and prior period of NPO. Advocated for continuing TF at goal rate and restricting diet to full liquids. Need to take diet advancement slow to assess tolerance. No family in room bedside when I stopped by - will plan to check after noon. Will continue TF @ 35 mL/hr and monitor tolerance. Labs reviewed, lytes WNL.    11/6: Follow up. TF has not been ran since morning of 11.05. PN order placed but has not been delivered due to inadequate access. GI assessment yesterday concluded small frequent emesis is not composed of or caused by tube feeds. Will re-start TF today. Modifying order from cyclic to continuous and will attempt to meet needs with TF alone. Per nurse, small emesis has continued this AM - clear/mucus. Will trial continuous TF today and reassess tomorrow.     Trickle feeds Nepro @ 35 mL/hour provides 1386 kcal (100%  needs),123 g carbs, 62 g protein (90% needs) TF + FWF provides 1384 H2O/day.     11/5:  Follow up.  TF ran over night 11/3 as Nepro at 30 ml/hr x 10 hours which she tolerated with no complaints.  Last night we increased the rate to 40 ml/hr and she had n/v.  NPO now.  GI to see.  Spoke with daughter who states feeding was administered through J-port.  Will need to start PN until pt able to tolerate PO/TF.    PPN rec's:  goal rate of D5, 4.25% AA at 83 ml/hr + 250 ml 20% lipids MWF (TG level of 352).    This will provide 890 kcals (65% kcal needs), 85 gm protein (100% protein needs)    11/3: Follow up.  Pt remains NPO.  Have reached out to MD regarding need to start nutrition support, J-tube feeds vs PPN.  Still having N/V.  No new weight. TF rec's below in bold.      10/30:  Follow up.  Pt remains NPO.  Will recommend starting TF via J port.  Still having N/V.  No new weight.      10/28: MD consult received for TF rec's. Chart review indicate G-J tube placed in August 2025 and started  on cyclic TF.  Spoke with pt's daughter at bedside.  She reports pt eats by mouth at home.  Some days she has n/v and is not able to tolerate PO so she gives herself 3 bolus feeds of NovaSource Renal 2.0 3x/day.  Pt and daughter unsure how much she gives herself, 2 large syringes full of feed.  On days when she eats and tolerates PO then she does not use feeding tube or might do one feed at night.  When I asked what foods she eats by mouth, she told me soups, cooked vegetables, and fruits.  Discussed low fiber diet necessary for gastroparesis, recommended all vegetables be cooked very soft, no beans in her soups, and fruits be canned.  Daughter states she has been losing weight, unable to quantify.  Weight hx in EMR indicates 6% loss over last 5 months which is not significant for time frame.  Weight seems to fluctuate between 115-135 lbs over last 1.5 years.       Pt currently NPO, MD wants to hold off on starting TF.  Pt was actively  vomiting during my visit.  Did not complete NFPE because of this.      TF rec's when ready to start TF:  Nepro at 40 ml/hr x 10 hours + 1 ProSource daily + 30 ml h20 flush q4h around the clock.  This will provide 400 ml, 800 kcals (59% kcal needs), 52 gm protein (74% protein needs), 64 gm CHO, 290 + 30 + 180 = 500 ml water           Nutritionally Significant Medications:  NaCl at 100 ml/hr; Epo, Renvela , Vit B6    Estimated Daily Nutrient Needs:  Energy Requirements Based On: Kcal/kg  Weight Used for Energy Requirements: Current  Energy (kcal/day): 1365-1640 (25-30 kcals/kg)  Weight Used for Protein Requirements: Current  Protein (g/day): 70 (1.3 gm/kg)  Method Used for Fluid Requirements: 1 ml/kcal  Fluid (ml/day): 1365    Nutrition Related Findings:   Edema: Generalized      RUE Edema: Trace           Recent Labs     08/15/24  0403 08/16/24  0511 08/17/24  0354   GLUCOSE 158* 143* 240*   BUN 16 10 17    CREATININE 4.57* 3.57* 4.97*   NA 138 140 135*   K 3.8 3.6 4.1   CL 97* 99 96*   CO2 24 24 27    CALCIUM  8.1* 8.4* 8.9   PHOS 4.0  --  2.7   MG 1.9  --  2.0     Recent Labs     08/15/24  0611 08/15/24  1658 08/15/24  2059 08/15/24  2124 08/16/24  0825 08/16/24  1148 08/16/24  1553 08/16/24  1937 08/17/24  0802   POCGLU 147* 145* 147* 150* 153* 155* 300* 293* 182*     Lab Results   Component Value Date/Time    LABA1C 6.1 06/27/2024 05:10 AM    LABA1C 7.0 02/19/2024 05:02 AM    LABA1C 6.8 10/24/2023 03:52 AM    EAG 127 06/27/2024 05:10 AM     Triglycerides   Date Value Ref Range Status   08/14/2024 352 (H) 0 - 150 MG/DL Final     Comment:     Borderline High: 150-199 mg/dL, High: 799-500 mg/dL  Very High: Greater than or equal to 500 mg/dL     89/71/7974 828 (H) 0 - 150 MG/DL Final     Comment:  Borderline High: 150-199 mg/dL, High: 799-500 mg/dL  Very High: Greater than or equal to 500 mg/dL     98/98/7974 844 (H) <150 MG/DL Final     Comment:     Based on NCEP-ATP III:  Triglycerides <150 mg/dL  is considered  normal, 150-199 mg/dL  borderline high,  799-500 mg/dL high and  greater than or equal to 500 mg/dL very high.       Last BM: 08/13/24    Wounds: Wound Type: None    Current Nutrition Therapies:  Diet: NPO  Supplements: none  Meal Intake:   No data found.  Supplement Intake:  No data found.  Nutrition Support: none    Anthropometric Measures:  Height: 149.9 cm (4' 11.02)  Ideal Body Weight (IBW): 95 lbs (43 kg)       Current Body Weight: 54.6 kg (120 lb 5.9 oz), 126.7 % IBW. Weight Source: Bed scale  Current BMI (kg/m2): 24.3        Weight Adjustment For: No Adjustment                 BMI Categories: Normal Weight (BMI 18.5-24.9)    Wt Readings from Last 10 Encounters:   08/06/24 54.6 kg (120 lb 5.9 oz)   07/20/24 57.2 kg (126 lb 1.7 oz)   07/11/24 54.6 kg (120 lb 5.9 oz)   07/09/24 55.5 kg (122 lb 5.7 oz)   03/06/24 57.6 kg (127 lb)   02/21/24 58 kg (127 lb 13.9 oz)   11/28/23 52.2 kg (115 lb 1.3 oz)   10/31/23 52.6 kg (115 lb 15.4 oz)   10/21/23 55.2 kg (121 lb 11.1 oz)   10/12/23 60.4 kg (133 lb 3.2 oz)       Nutrition Diagnosis:   Inadequate protein-energy intake related to nausea/vomiting/diarrhea as evidenced by poor intake prior to admission    Nutrition Interventions:   Food and/or Nutrient Delivery: Continue Current Tube Feeding, Modify Current Diet  Nutrition Education/Counseling: Education/Counseling initiated  Coordination of Nutrition Care: Continue to monitor while inpatient       Goals:  Previous Goal Met: New Goal  Goals: Tolerate nutrition support at goal rate, by next RD assessment       Nutrition Monitoring and Evaluation:   Behavioral-Environmental Outcomes: None Identified  Food/Nutrient Intake Outcomes: Enteral Nutrition Intake/Tolerance, Parenteral Nutrition Intake/Tolerance  Physical Signs/Symptoms Outcomes: Biochemical Data, Nausea or Vomiting, Weight    Discharge Planning:    Enteral Nutrition     Victory Ranger, MS, RD  Available via [employer]    "

## 2024-08-17 NOTE — Progress Notes (Signed)
"  Intake/Outtake 11/7     TF: none     Breakast: 25%  240 mL    "

## 2024-08-18 LAB — COMPREHENSIVE METABOLIC PANEL
ALT: <5 U/L — ABNORMAL LOW (ref 10–35)
AST: 31 U/L (ref 10–35)
Albumin/Globulin Ratio: 1 — ABNORMAL LOW (ref 1.1–2.2)
Albumin: 3.3 g/dL — ABNORMAL LOW (ref 3.5–5.2)
Alk Phosphatase: 151 U/L — ABNORMAL HIGH (ref 35–104)
Anion Gap: 10 mmol/L (ref 2–14)
BUN/Creatinine Ratio: 3 — ABNORMAL LOW (ref 12–20)
BUN: 12 mg/dL (ref 6–20)
CO2: 33 mmol/L — ABNORMAL HIGH (ref 20–29)
Calcium: 9.1 mg/dL (ref 8.6–10.0)
Chloride: 96 mmol/L — ABNORMAL LOW (ref 98–107)
Creatinine: 3.74 mg/dL — ABNORMAL HIGH (ref 0.60–1.00)
Est, Glom Filt Rate: 15 ml/min/1.73m2 — ABNORMAL LOW (ref >59)
Globulin: 3.1 g/dL (ref 2.0–4.0)
Glucose: 134 mg/dL — ABNORMAL HIGH (ref 65–100)
Potassium: 4.4 mmol/L (ref 3.5–5.1)
Sodium: 139 mmol/L (ref 136–145)
Total Bilirubin: 0.5 mg/dL (ref 0.0–1.2)
Total Protein: 6.4 g/dL (ref 6.4–8.3)

## 2024-08-18 LAB — CBC WITH AUTO DIFFERENTIAL
Basophils %: 0.7 % (ref 0.0–1.0)
Basophils Absolute: 0.07 K/UL (ref 0.00–0.10)
Eosinophils %: 3.6 % (ref 0.0–7.0)
Eosinophils Absolute: 0.37 K/UL (ref 0.00–0.40)
Hematocrit: 41.5 % (ref 35.0–47.0)
Hemoglobin: 12.8 g/dL (ref 11.5–16.0)
Immature Granulocytes %: 0.9 % — ABNORMAL HIGH (ref 0.0–0.5)
Immature Granulocytes Absolute: 0.09 K/UL — ABNORMAL HIGH (ref 0.00–0.04)
Lymphocytes %: 16.3 % (ref 12.0–49.0)
Lymphocytes Absolute: 1.68 K/UL (ref 0.80–3.50)
MCH: 30 pg (ref 26.0–34.0)
MCHC: 30.8 g/dL (ref 30.0–36.5)
MCV: 97.2 FL (ref 80.0–99.0)
MPV: 9 FL (ref 8.9–12.9)
Monocytes %: 9.8 % (ref 5.0–13.0)
Monocytes Absolute: 1.01 K/UL — ABNORMAL HIGH (ref 0.00–1.00)
Neutrophils %: 68.7 % (ref 32.0–75.0)
Neutrophils Absolute: 7.09 K/UL (ref 1.80–8.00)
Nucleated RBCs: 0.2 /100{WBCs} — ABNORMAL HIGH
Platelets: 444 K/uL — ABNORMAL HIGH (ref 150–400)
RBC: 4.27 M/uL (ref 3.80–5.20)
RDW: 17 % — ABNORMAL HIGH (ref 11.5–14.5)
WBC: 10.3 K/uL (ref 3.6–11.0)
nRBC: 0.02 K/uL — ABNORMAL HIGH (ref 0.00–0.01)

## 2024-08-18 LAB — POCT GLUCOSE
POC Glucose: 225 mg/dL — ABNORMAL HIGH (ref 65–117)
POC Glucose: 133 mg/dL — ABNORMAL HIGH (ref 65–117)
POC Glucose: 203 mg/dL — ABNORMAL HIGH (ref 65–117)
POC Glucose: 133 mg/dL — ABNORMAL HIGH (ref 65–117)
POC Glucose: 168 mg/dL — ABNORMAL HIGH (ref 65–117)

## 2024-08-18 MED ORDER — ACETAMINOPHEN 500 MG PO TABS
500 | Freq: Four times a day (QID) | ORAL | Status: DC
Start: 2024-08-18 — End: 2024-08-24
  Administered 2024-08-18 – 2024-08-24 (×15): 1000 mg via GASTROSTOMY

## 2024-08-18 MED ORDER — POLYETHYLENE GLYCOL 3350 17 G PO PACK
17 | Freq: Every day | ORAL | Status: DC | PRN
Start: 2024-08-18 — End: 2024-08-28

## 2024-08-18 MED ORDER — SODIUM BICARBONATE 650 MG PO TABS
650 | Freq: Three times a day (TID) | ORAL | Status: DC
Start: 2024-08-18 — End: 2024-08-28
  Administered 2024-08-18 – 2024-08-27 (×23): 650 mg via GASTROSTOMY

## 2024-08-18 MED ORDER — DICYCLOMINE HCL 10 MG PO CAPS
10 | Freq: Three times a day (TID) | ORAL | Status: DC
Start: 2024-08-18 — End: 2024-08-28
  Administered 2024-08-18 – 2024-08-27 (×18): 10 mg via GASTROSTOMY

## 2024-08-18 MED ORDER — OXYCODONE HCL 5 MG PO TABS
5 | ORAL | Status: DC | PRN
Start: 2024-08-18 — End: 2024-08-18

## 2024-08-18 MED ORDER — SEVELAMER CARBONATE 0.8 G PO PACK
0.8 | Freq: Three times a day (TID) | ORAL | Status: DC
Start: 2024-08-18 — End: 2024-08-28
  Administered 2024-08-18 – 2024-08-27 (×23): 0.8 g via GASTROSTOMY

## 2024-08-18 MED FILL — BUSPIRONE HCL 10 MG PO TABS: 10 mg | ORAL | Qty: 1 | Fill #0

## 2024-08-18 MED FILL — SODIUM BICARBONATE 650 MG PO TABS: 650 mg | ORAL | Qty: 1 | Fill #0

## 2024-08-18 MED FILL — SENNOSIDES-DOCUSATE SODIUM 8.6-50 MG PO TABS: 8.6-50 mg | ORAL | Qty: 1 | Fill #0

## 2024-08-18 MED FILL — PROCHLORPERAZINE EDISYLATE 10 MG/2ML IJ SOLN: 10 MG/2ML | INTRAMUSCULAR | Qty: 2 | Fill #0

## 2024-08-18 MED FILL — DICYCLOMINE HCL 10 MG PO CAPS: 10 mg | ORAL | Qty: 1 | Fill #0

## 2024-08-18 MED FILL — HEPARIN SODIUM (PORCINE) 5000 UNIT/ML IJ SOLN: 5000 [IU]/mL | INTRAMUSCULAR | Qty: 1 | Fill #0

## 2024-08-18 MED FILL — METOCLOPRAMIDE HCL 5 MG/ML IJ SOLN: 5 mg/mL | INTRAMUSCULAR | Qty: 2 | Fill #0

## 2024-08-18 MED FILL — INSULIN LISPRO 100 UNIT/ML IJ SOLN: 100 [IU]/mL | INTRAMUSCULAR | Qty: 2 | Fill #0

## 2024-08-18 MED FILL — NIFEDIPINE ER OSMOTIC RELEASE 30 MG PO TB24: 30 mg | ORAL | Qty: 3 | Fill #0

## 2024-08-18 MED FILL — POLYETHYLENE GLYCOL 3350 17 G PO PACK: 17 g | ORAL | Qty: 1 | Fill #0

## 2024-08-18 MED FILL — PANTOPRAZOLE SODIUM 40 MG IV SOLR: 40 mg | INTRAVENOUS | Qty: 40 | Fill #0

## 2024-08-18 MED FILL — SEVELAMER CARBONATE 0.8 G PO PACK: 0.8 g | ORAL | Qty: 1 | Fill #0

## 2024-08-18 MED FILL — VITAMIN B-6 50 MG PO TABS: 50 mg | ORAL | Qty: 1 | Fill #0

## 2024-08-18 MED FILL — MORPHINE SULFATE 4 MG/ML IJ SOLN: 4 mg/mL | INTRAMUSCULAR | Qty: 1 | Fill #0

## 2024-08-18 MED FILL — CLOPIDOGREL BISULFATE 75 MG PO TABS: 75 mg | ORAL | Qty: 1 | Fill #0

## 2024-08-18 MED FILL — CARVEDILOL 12.5 MG PO TABS: 12.5 mg | ORAL | Qty: 2 | Fill #0

## 2024-08-18 MED FILL — ACETAMINOPHEN EXTRA STRENGTH 500 MG PO TABS: 500 mg | ORAL | Qty: 2 | Fill #0

## 2024-08-18 MED FILL — ATORVASTATIN CALCIUM 20 MG PO TABS: 20 mg | ORAL | Qty: 4 | Fill #0

## 2024-08-18 MED FILL — DULOXETINE HCL 30 MG PO CPEP: 30 mg | ORAL | Qty: 2 | Fill #0

## 2024-08-18 MED FILL — HYDRALAZINE HCL 25 MG PO TABS: 25 mg | ORAL | Qty: 2 | Fill #0

## 2024-08-18 MED FILL — ONDANSETRON HCL 4 MG/2ML IJ SOLN: 4 MG/2ML | INTRAMUSCULAR | Qty: 2 | Fill #0

## 2024-08-18 MED FILL — CEFAZOLIN SODIUM 1 G IJ SOLR: 1 g | INTRAMUSCULAR | Qty: 1000 | Fill #0

## 2024-08-18 MED FILL — ASPIRIN 81 MG PO CHEW: 81 mg | ORAL | Qty: 1 | Fill #0

## 2024-08-18 NOTE — Progress Notes (Addendum)
 "        [facility]                                                                                          Hospitalist Progress Note  Altamese Irani, DO  Office Phone: [phone]        Date of Service:  08/18/2024  NAME:  Melinda [name]  DOB:  1981-08-08  MRN:  [id number]     HPI:     Follow-up gastroparesis.  Blood pressure elevated.  Patient continues to endorse some nausea and vomiting but overall improved and willing to try liquid diet this morning.  Chronic abdominal pain.     Assessment & Plan:     43 year old female with ESRD, type 2 diabetes, hypertension, admitted with acute on chronic vomiting and abdominal pain likely secondary to gastroparesis.    Gastroparesis with emesis abdominal pain, status post GJ tube  - Per GI note, patient has intractable vomiting that is difficult to treat, but should continue to use her GJ tube  - Holding off on catheter for long-term TPN in an effort to maintain nutrition through tube feeds  - Continue liquid diet as tolerated  - PPI and Pepcid   - Continue scheduled Reglan   - Continue scopolamine  patch  - bowel reg  - Resume tube feeds today as able - patient currently refusing     Sepsis secondary to MSSA bacteremia  - Per ID, cefazolin  2 g on HD days through 09/19/2024    Prolonged QT  - Monitor EKG every other day on antiemetics    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  POA  - Phosphorus trended down; continue sevelamer       Fentanyl  positive: Seen on drug screen.  May be a false positive in the setting of Benadryl  use.  Patient adamantly denies use.     Hypertension POA  Elevated into 180s today  - Continue home Coreg  and hydralazine   -Patient will try nifedipine  with clear liquid diet  -Monitor BPs     ESRD  On HD  Nephrology following     GERD: Chronic.    Continue Pepcid  and pantop     T2DM with gastroparesis. POA, chronic.  Last A1c of 6.1 on 9/17.    -Sliding scale insulin .      Chronic pain or  anxiety/depression  -Duloxetine  and BuSpar .     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 Mesenteric MRA 01/22/24 with proximally and distally patent SMA and diffuse anasarca.   - will continue plavix  and ASA     Anemia: Trend.  Follow-up outpatient       Code status:   Full Code  DVT Prophylaxis: heparin   Anticipated Disposition: home once symptoms and BP improved       Review of Systems:   Pertinent symptoms noted in the HPI    Vital Signs:    Last 24hrs VS reviewed since prior progress note. Most recent are:  Vitals:    08/18/24  0807   BP: (!) 183/89   Pulse: 100   Resp: 16   Temp: 98.1 F (36.7 C)   SpO2: 96%       Physical Examination:     I had a face to face encounter with this patient and independently examined them on 08/18/2024 as outlined below:        General : alert and awake, no acute distress  HEENT: moist mucus membranes  Chest: CTAB, normal WOB  CVS: S1 and S2 heard, no m/g/r  Abd: non distended. Mild TTP  Ext: WWP, no edema  Neuro/Psych: no focal deficits  Skin: no rash      Labs and imaging:     Recent Labs     08/17/24  0354 08/18/24  0643   WBC 9.6 10.3   HGB 13.2 12.8   HCT 39.9 41.5   PLT 460* 444*     Recent Labs     08/16/24  0511 08/17/24  0354 08/18/24  0643   NA 140 135* 139   K 3.6 4.1 4.4   CL 99 96* 96*   CO2 24 27 33*   BUN 10 17 12    MG  --  2.0  --    PHOS  --  2.7  --      Recent Labs     08/16/24  0511 08/17/24  0354 08/18/24  0643   ALT <5* <5* <5*   GLOB 3.2 3.4 3.1     No results for input(s): INR, APTT in the last 72 hours.    Invalid input(s): PTP   No results for input(s): TIBC in the last 72 hours.    Invalid input(s): FE, PSAT, FERR   No results found for: RBCF   No results for input(s): PH, PCO2, PO2 in the last 72 hours.  No results for input(s): CPK in the last 72 hours.    Invalid input(s): CPKMB, CKNDX, TROIQ  Lab Results   Component Value Date/Time    CHOL 192  08/07/2024 11:19 AM    HDL 80 08/07/2024 11:19 AM    LDL 78 08/07/2024 11:19 AM    LDL Not calculated due to elevated triglyceride level 09/16/2022 10:30 AM     No results found for: GLUCPOC  @LABUA @      US  EXTREMITY LEFT NON VASC LIMITED   Final Result   An ill-defined, heterogeneously hypoechoic mass anterior to the AV fistula graft   measures 6 x 12 x 16 mm. Internal blood flow cannot be determined due to   adjacent arterial flow causing artifact. Phlegmon or old hematoma are possible.   It is not a circumscribed, walled off abscess. The patient reports that this has   been palpable for 2 years, favoring old hematoma.         Electronically signed by Medford Manners      MRI LUMBAR SPINE W WO CONTRAST   Final Result      Degenerative changes are conspicuous at the cervical spine.      Moderate to severe canal stenosis at C4-5 and moderate canal stenosis at C3-C4.      No other canal or foraminal compromise is identified.      There is no fracture or dislocation.      There is no cord signal abnormality or abnormal intrathecal enhancement. No   evidence of discitis/osteomyelitis.  Electronically signed by JIMMY RABON      MRI THORACIC SPINE W WO CONTRAST   Final Result      Degenerative changes are conspicuous at the cervical spine.      Moderate to severe canal stenosis at C4-5 and moderate canal stenosis at C3-C4.      No other canal or foraminal compromise is identified.      There is no fracture or dislocation.      There is no cord signal abnormality or abnormal intrathecal enhancement. No   evidence of discitis/osteomyelitis.                     Electronically signed by JIMMY RABON      MRI CERVICAL SPINE W WO CONTRAST   Final Result      Degenerative changes are conspicuous at the cervical spine.      Moderate to severe canal stenosis at C4-5 and moderate canal stenosis at C3-C4.      No other canal or foraminal compromise is identified.      There is no fracture or dislocation.       There is no cord signal abnormality or abnormal intrathecal enhancement. No   evidence of discitis/osteomyelitis.                     Electronically signed by JIMMY HABIB      CT Upper Extremity Left W Contrast   Final Result   Nonspecific 2 cm area of subcutaneous attenuation anterior to the   distal graft anastomosis. A defined collection is not shown. Further evaluation   with ultrasound at the distal anastomotic site of the graft is recommended to   exclude small abscess.         Electronically signed by Alm Edman, MD      XR CHEST PORTABLE   Final Result   No acute cardiopulmonary process.      Electronically signed by Harden Cahill      CT ABDOMEN PELVIS WO CONTRAST Additional Contrast? None   Final Result   No acute intraperitoneal process is identified.   Gastrojejunostomy.   Colonic diverticulosis with no diverticulitis.   Incidental and/or nonemergent findings are as described in detail above.         Electronically signed by JIMMY HABIB          08/06/24    ECHO (TTE) COMPLETE (PRN CONTRAST/BUBBLE/STRAIN/3D) 08/12/2024  1:21 PM (Final)    Interpretation Summary    Left Ventricle: Low normal left ventricular systolic function. EF by visual approximation is 55%. Left ventricle size is normal. Findings consistent with moderate concentric hypertrophy. Normal wall motion. Diastolic dysfunction present with normal LV EF.    Right Ventricle: Right ventricle size is normal. Normal systolic function.    Aortic Valve: Mildly calcified cusps. Mild sclerosis of the aortic valve cusps.    Mitral Valve: There is mild annular calcification noted.    Image quality is good.    Signed by: Vikas K Rathi, MD on 08/12/2024  1:21 PM        Level of Service Documentation     In caring for this patient today, I reviewed recent notes, interpreted lab results and imaging tests, had a face-to-face encounter with the patient, performing the medically necessary appropriate exam and history, counseled the patient/family/caregiver,  ordered medications, tests and procedures as indicated for diagnostic and therapeutic purposes, consulted and communicated with other healthcare professionals including care coordination and discharge  planning, and documented clinical information in the electronic health record.  ______________________________________________________________________                 Altamese Irani, DO     "

## 2024-08-18 NOTE — Progress Notes (Signed)
"  I spoke with Randall in pharmacy in regards to patients medications. Patient stated she does not want to take any meds PO and wants all meds given through her G tube. Per Randall in pharmacy, Nifedipine  cannot be crushed or broken and there is not an alternative form that it can be changed to safely. Per Darcey recommendations, sodium bicarb, renevala, and dicyclomine  orders can all be given via G tube. I let Dr. Malcom know about the above information and asked for those routes to be changed to G tube.   "

## 2024-08-18 NOTE — Plan of Care (Signed)
"    Problem: Chronic Conditions and Co-morbidities  Goal: Patient's chronic conditions and co-morbidity symptoms are monitored and maintained or improved  Outcome: Progressing     Problem: Discharge Planning  Goal: Discharge to home or other facility with appropriate resources  Outcome: Progressing     Problem: Pain  Goal: Verbalizes/displays adequate comfort level or baseline comfort level  Outcome: Progressing     Problem: Safety - Adult  Goal: Free from fall injury  Outcome: Progressing     Problem: Nutrition Deficit:  Goal: Optimize nutritional status  Outcome: Progressing     Problem: Gastrointestinal - Adult  Goal: Minimal or absence of nausea and vomiting  Outcome: Progressing  Goal: Maintains or returns to baseline bowel function  Outcome: Progressing  Goal: Maintains adequate nutritional intake  Outcome: Progressing     Problem: Skin/Tissue Integrity  Goal: Skin integrity remains intact  Description: 1.  Monitor for areas of redness and/or skin breakdown  2.  Assess vascular access sites hourly  3.  Every 4-6 hours minimum:  Change oxygen saturation probe site  4.  Every 4-6 hours:  If on nasal continuous positive airway pressure, respiratory therapy assess nares and determine need for appliance change or resting period  Outcome: Progressing     "

## 2024-08-18 NOTE — Plan of Care (Signed)
 "  Problem: Chronic Conditions and Co-morbidities  Goal: Patient's chronic conditions and co-morbidity symptoms are monitored and maintained or improved  08/18/2024 1403 by Othel Ewings, RN  Outcome: Not Progressing  08/18/2024 0214 by Devonne Ip, RN  Outcome: Progressing     Problem: Discharge Planning  Goal: Discharge to home or other facility with appropriate resources  08/18/2024 1403 by Othel Ewings, RN  Outcome: Not Progressing  08/18/2024 0214 by Devonne Ip, RN  Outcome: Progressing     Problem: Pain  Goal: Verbalizes/displays adequate comfort level or baseline comfort level  08/18/2024 1403 by Othel Ewings, RN  Outcome: Not Progressing  08/18/2024 0214 by Devonne Ip, RN  Outcome: Progressing     Problem: Safety - Adult  Goal: Free from fall injury  08/18/2024 1403 by Othel Ewings, RN  Outcome: Not Progressing  08/18/2024 0214 by Devonne Ip, RN  Outcome: Progressing     Problem: Nutrition Deficit:  Goal: Optimize nutritional status  08/18/2024 1403 by Othel Ewings, RN  Outcome: Not Progressing  08/18/2024 0214 by Devonne Ip, RN  Outcome: Progressing     Problem: Gastrointestinal - Adult  Goal: Minimal or absence of nausea and vomiting  08/18/2024 1403 by Othel Ewings, RN  Outcome: Not Progressing  08/18/2024 0214 by Devonne Ip, RN  Outcome: Progressing  Goal: Maintains or returns to baseline bowel function  08/18/2024 1403 by Othel Ewings, RN  Outcome: Not Progressing  08/18/2024 0214 by Devonne Ip, RN  Outcome: Progressing  Goal: Maintains adequate nutritional intake  08/18/2024 1403 by Othel Ewings, RN  Outcome: Not Progressing  08/18/2024 0214 by Devonne Ip, RN  Outcome: Progressing     Problem: Skin/Tissue Integrity  Goal: Skin integrity remains intact  Description: 1.  Monitor for areas of redness and/or skin breakdown  2.  Assess vascular access sites hourly  3.  Every 4-6 hours minimum:  Change oxygen saturation probe site  4.  Every 4-6 hours:  If  on nasal continuous positive airway pressure, respiratory therapy assess nares and determine need for appliance change or resting period  08/18/2024 1403 by Othel Ewings, RN  Outcome: Not Progressing  08/18/2024 0214 by Devonne Ip, RN  Outcome: Progressing     Problem: Chronic Conditions and Co-morbidities  Goal: Patient's chronic conditions and co-morbidity symptoms are monitored and maintained or improved  08/18/2024 1403 by Othel Ewings, RN  Outcome: Not Progressing  08/18/2024 0214 by Devonne Ip, RN  Outcome: Progressing     Problem: Discharge Planning  Goal: Discharge to home or other facility with appropriate resources  08/18/2024 1403 by Othel Ewings, RN  Outcome: Not Progressing  08/18/2024 0214 by Devonne Ip, RN  Outcome: Progressing     Problem: Pain  Goal: Verbalizes/displays adequate comfort level or baseline comfort level  08/18/2024 1403 by Othel Ewings, RN  Outcome: Not Progressing  08/18/2024 0214 by Devonne Ip, RN  Outcome: Progressing     Problem: Safety - Adult  Goal: Free from fall injury  08/18/2024 1403 by Othel Ewings, RN  Outcome: Not Progressing  08/18/2024 0214 by Devonne Ip, RN  Outcome: Progressing     Problem: Nutrition Deficit:  Goal: Optimize nutritional status  08/18/2024 1403 by Othel Ewings, RN  Outcome: Not Progressing  08/18/2024 0214 by Devonne Ip, RN  Outcome: Progressing     Problem: Gastrointestinal - Adult  Goal: Minimal or absence of nausea and vomiting  08/18/2024 1403 by Othel Ewings, RN  Outcome: Not Progressing  08/18/2024 0214 by Devonne Ip, RN  Outcome: Progressing  Goal: Maintains or returns to baseline bowel function  08/18/2024 1403 by Othel Ewings, RN  Outcome: Not Progressing  08/18/2024 0214 by Devonne Ip, RN  Outcome: Progressing  Goal: Maintains adequate nutritional intake  08/18/2024 1403 by Othel Ewings, RN  Outcome: Not Progressing  08/18/2024 0214 by Devonne Ip, RN  Outcome: Progressing      Problem: Skin/Tissue Integrity  Goal: Skin integrity remains intact  Description: 1.  Monitor for areas of redness and/or skin breakdown  2.  Assess vascular access sites hourly  3.  Every 4-6 hours minimum:  Change oxygen saturation probe site  4.  Every 4-6 hours:  If on nasal continuous positive airway pressure, respiratory therapy assess nares and determine need for appliance change or resting period  08/18/2024 1403 by Othel Ewings, RN  Outcome: Not Progressing  08/18/2024 0214 by Devonne Ip, RN  Outcome: Progressing     "

## 2024-08-19 LAB — POCT GLUCOSE
POC Glucose: 265 mg/dL — ABNORMAL HIGH (ref 65–117)
POC Glucose: 214 mg/dL — ABNORMAL HIGH (ref 65–117)
POC Glucose: 106 mg/dL (ref 65–117)
POC Glucose: 178 mg/dL — ABNORMAL HIGH (ref 65–117)

## 2024-08-19 LAB — COMPREHENSIVE METABOLIC PANEL
ALT: <5 U/L — ABNORMAL LOW (ref 10–35)
AST: 26 U/L (ref 10–35)
Albumin/Globulin Ratio: 0.8 — ABNORMAL LOW (ref 1.1–2.2)
Albumin: 2.6 g/dL — ABNORMAL LOW (ref 3.5–5.2)
Alk Phosphatase: 128 U/L — ABNORMAL HIGH (ref 35–104)
Anion Gap: 10 mmol/L (ref 2–14)
BUN/Creatinine Ratio: 4 — ABNORMAL LOW (ref 12–20)
BUN: 18 mg/dL (ref 6–20)
CO2: 29 mmol/L (ref 20–29)
Calcium: 8.5 mg/dL — ABNORMAL LOW (ref 8.6–10.0)
Chloride: 97 mmol/L — ABNORMAL LOW (ref 98–107)
Creatinine: 4.74 mg/dL — ABNORMAL HIGH (ref 0.60–1.00)
Est, Glom Filt Rate: 11 ml/min/1.73m2 — ABNORMAL LOW (ref >59)
Globulin: 3.2 g/dL (ref 2.0–4.0)
Glucose: 191 mg/dL — ABNORMAL HIGH (ref 65–100)
Potassium: 3.9 mmol/L (ref 3.5–5.1)
Sodium: 136 mmol/L (ref 136–145)
Total Bilirubin: 0.3 mg/dL (ref 0.0–1.2)
Total Protein: 5.8 g/dL — ABNORMAL LOW (ref 6.4–8.3)

## 2024-08-19 LAB — CBC WITH AUTO DIFFERENTIAL
Basophils %: 0.9 % (ref 0.0–1.0)
Basophils Absolute: 0.07 K/UL (ref 0.00–0.10)
Eosinophils %: 4.4 % (ref 0.0–7.0)
Eosinophils Absolute: 0.34 K/UL (ref 0.00–0.40)
Hematocrit: 34.8 % — ABNORMAL LOW (ref 35.0–47.0)
Hemoglobin: 11.1 g/dL — ABNORMAL LOW (ref 11.5–16.0)
Immature Granulocytes %: 0.9 % — ABNORMAL HIGH (ref 0.0–0.5)
Immature Granulocytes Absolute: 0.07 K/UL — ABNORMAL HIGH (ref 0.00–0.04)
Lymphocytes %: 18.3 % (ref 12.0–49.0)
Lymphocytes Absolute: 1.4 K/UL (ref 0.80–3.50)
MCH: 31.2 pg (ref 26.0–34.0)
MCHC: 31.9 g/dL (ref 30.0–36.5)
MCV: 97.8 FL (ref 80.0–99.0)
MPV: 8.9 FL (ref 8.9–12.9)
Monocytes %: 9.3 % (ref 5.0–13.0)
Monocytes Absolute: 0.71 K/UL (ref 0.00–1.00)
Neutrophils %: 66.2 % (ref 32.0–75.0)
Neutrophils Absolute: 5.06 K/UL (ref 1.80–8.00)
Nucleated RBCs: 0 /100{WBCs}
Platelets: 444 K/uL — ABNORMAL HIGH (ref 150–400)
RBC: 3.56 M/uL — ABNORMAL LOW (ref 3.80–5.20)
RDW: 16.4 % — ABNORMAL HIGH (ref 11.5–14.5)
WBC: 7.7 K/uL (ref 3.6–11.0)
nRBC: 0 K/uL (ref 0.00–0.01)

## 2024-08-19 MED ORDER — HYDROMORPHONE HCL PF 1 MG/ML IJ SOLN
1 | INTRAMUSCULAR | Status: DC | PRN
Start: 2024-08-19 — End: 2024-08-24
  Administered 2024-08-19: 22:00:00 0.5 mg via INTRAVENOUS

## 2024-08-19 MED ORDER — HYDRALAZINE HCL 25 MG PO TABS
25 | Freq: Three times a day (TID) | ORAL | Status: DC
Start: 2024-08-19 — End: 2024-08-19

## 2024-08-19 MED ORDER — GLUCAGON (RDNA) 1 MG IJ KIT
1 | INTRAMUSCULAR | Status: DC | PRN
Start: 2024-08-19 — End: 2024-08-28

## 2024-08-19 MED ORDER — DEXTROSE 10 % IV SOLN
10 | INTRAVENOUS | Status: DC | PRN
Start: 2024-08-19 — End: 2024-08-28

## 2024-08-19 MED ORDER — LABETALOL HCL 5 MG/ML IV SOLN
5 | INTRAVENOUS | Status: DC | PRN
Start: 2024-08-19 — End: 2024-08-28
  Administered 2024-08-20 – 2024-08-28 (×3): 10 mg via INTRAVENOUS

## 2024-08-19 MED ORDER — HYDROMORPHONE HCL PF 1 MG/ML IJ SOLN
1 | INTRAMUSCULAR | Status: DC | PRN
Start: 2024-08-19 — End: 2024-08-24
  Administered 2024-08-20 (×3): 1 mg via INTRAVENOUS

## 2024-08-19 MED ORDER — GLUCOSE 4 G PO CHEW
4 | ORAL | Status: DC | PRN
Start: 2024-08-19 — End: 2024-08-19

## 2024-08-19 MED ORDER — DEXTROSE 10 % IV BOLUS
INTRAVENOUS | Status: DC | PRN
Start: 2024-08-19 — End: 2024-08-28

## 2024-08-19 MED ORDER — HYDRALAZINE HCL 25 MG PO TABS
25 | Freq: Three times a day (TID) | ORAL | Status: DC
Start: 2024-08-19 — End: 2024-08-27
  Administered 2024-08-19 – 2024-08-27 (×17): 100 mg via GASTROSTOMY

## 2024-08-19 MED ORDER — GLUCOSE 4 G PO CHEW
4 | ORAL | Status: DC | PRN
Start: 2024-08-19 — End: 2024-08-28

## 2024-08-19 MED ORDER — LABETALOL HCL 5 MG/ML IV SOLN
5 | INTRAVENOUS | Status: DC | PRN
Start: 2024-08-19 — End: 2024-08-19

## 2024-08-19 MED ORDER — OXYCODONE HCL 100 MG/5ML PO CONC
100 | ORAL | Status: DC | PRN
Start: 2024-08-19 — End: 2024-08-24
  Administered 2024-08-19 – 2024-08-24 (×8): 5 mg via GASTROSTOMY

## 2024-08-19 MED FILL — VITAMIN B-6 50 MG PO TABS: 50 mg | ORAL | Qty: 1 | Fill #0

## 2024-08-19 MED FILL — CARVEDILOL 12.5 MG PO TABS: 12.5 mg | ORAL | Qty: 2 | Fill #0

## 2024-08-19 MED FILL — ONDANSETRON HCL 4 MG/2ML IJ SOLN: 4 MG/2ML | INTRAMUSCULAR | Qty: 2 | Fill #0

## 2024-08-19 MED FILL — INSULIN LISPRO 100 UNIT/ML IJ SOLN: 100 [IU]/mL | INTRAMUSCULAR | Qty: 4 | Fill #0

## 2024-08-19 MED FILL — PANTOPRAZOLE SODIUM 40 MG IV SOLR: 40 mg | INTRAVENOUS | Qty: 40 | Fill #0

## 2024-08-19 MED FILL — METOCLOPRAMIDE HCL 5 MG/ML IJ SOLN: 5 mg/mL | INTRAMUSCULAR | Qty: 2 | Fill #0

## 2024-08-19 MED FILL — MORPHINE SULFATE 4 MG/ML IJ SOLN: 4 mg/mL | INTRAMUSCULAR | Qty: 1 | Fill #0

## 2024-08-19 MED FILL — HYDRALAZINE HCL 25 MG PO TABS: 25 mg | ORAL | Qty: 2 | Fill #0

## 2024-08-19 MED FILL — HEPARIN SODIUM (PORCINE) 5000 UNIT/ML IJ SOLN: 5000 [IU]/mL | INTRAMUSCULAR | Qty: 1 | Fill #0

## 2024-08-19 MED FILL — OXYCODONE HCL 10 MG/0.5ML PO CONC: 10 MG/0.5ML | ORAL | Qty: 0.5 | Fill #0

## 2024-08-19 MED FILL — HYDRALAZINE HCL 25 MG PO TABS: 25 mg | ORAL | Qty: 4 | Fill #0

## 2024-08-19 MED FILL — SEVELAMER CARBONATE 0.8 G PO PACK: 0.8 g | ORAL | Qty: 1 | Fill #0

## 2024-08-19 MED FILL — SENNOSIDES-DOCUSATE SODIUM 8.6-50 MG PO TABS: 8.6-50 mg | ORAL | Qty: 1 | Fill #0

## 2024-08-19 MED FILL — BUSPIRONE HCL 10 MG PO TABS: 10 mg | ORAL | Qty: 1 | Fill #0

## 2024-08-19 MED FILL — HYDROMORPHONE HCL 1 MG/ML IJ SOLN: 1 mg/mL | INTRAMUSCULAR | Qty: 1 | Fill #0

## 2024-08-19 MED FILL — DULOXETINE HCL 30 MG PO CPEP: 30 mg | ORAL | Qty: 2 | Fill #0

## 2024-08-19 MED FILL — FAMOTIDINE 20 MG PO TABS: 20 mg | ORAL | Qty: 1 | Fill #0

## 2024-08-19 MED FILL — SODIUM BICARBONATE 650 MG PO TABS: 650 mg | ORAL | Qty: 1 | Fill #0

## 2024-08-19 MED FILL — ATORVASTATIN CALCIUM 20 MG PO TABS: 20 mg | ORAL | Qty: 4 | Fill #0

## 2024-08-19 MED FILL — INSULIN LISPRO 100 UNIT/ML IJ SOLN: 100 [IU]/mL | INTRAMUSCULAR | Qty: 2 | Fill #0

## 2024-08-19 MED FILL — SCOPOLAMINE 1 MG/3DAYS TD PT72: 1 MG/3DAYS | TRANSDERMAL | Qty: 1 | Fill #0

## 2024-08-19 MED FILL — DEXTROSE 10 % IV SOLN: 10 % | INTRAVENOUS | Qty: 500 | Fill #0

## 2024-08-19 MED FILL — CEFAZOLIN SODIUM 1 G IJ SOLR: 1 g | INTRAMUSCULAR | Qty: 1000 | Fill #0

## 2024-08-19 MED FILL — ASPIRIN 81 MG PO CHEW: 81 mg | ORAL | Qty: 1 | Fill #0

## 2024-08-19 MED FILL — CLOPIDOGREL BISULFATE 75 MG PO TABS: 75 mg | ORAL | Qty: 1 | Fill #0

## 2024-08-19 MED FILL — DICYCLOMINE HCL 10 MG PO CAPS: 10 mg | ORAL | Qty: 1 | Fill #0

## 2024-08-19 MED FILL — ACETAMINOPHEN EXTRA STRENGTH 500 MG PO TABS: 500 mg | ORAL | Qty: 2 | Fill #0

## 2024-08-19 NOTE — Progress Notes (Signed)
 "        [facility]                                                                                          Hospitalist Progress Note  Manuelita FORBES Epley, MD  Office Phone: [phone]        Date of Service:  08/19/2024  NAME:  Melinda [name]  DOB:  Feb 04, 1981  MRN:  [id number]     HPI:     Follow-up gastroparesis.  Patient had an overall good night last night was able to eat pudding, Jell-O, applesauce.  She attempted to eat a whole apple however nursing stopped her and gave her applesauce instead.  She did have episodes of vomiting last night but it did not appear to be the food that she was eating.    Patient states she is not tolerating meds today, gets out of bed and is pacing the room and retching in the bathroom as I see her.  She does have a fully eaten apple with the course sitting on her bedside table.  Patient states she can tolerate pudding, Jell-O, broth       Assessment & Plan:     43 year old female with ESRD, type 2 diabetes, hypertension, admitted with acute on chronic vomiting and abdominal pain likely secondary to gastroparesis.    Gastroparesis with emesis abdominal pain, status post GJ tube  - Per GI note, patient has intractable vomiting that is difficult to treat, but should continue to use her GJ tube  - Holding off on catheter for long-term TPN  - Make every effort to maintain nutrition through tube feeds  - Continue liquid diet as tolerated, now tolerating pudding and ate an apple with the core at bedside  - PPI and Pepcid   - Continue scheduled Reglan   - Continue scopolamine  patch  - bowel reg  - Resume tube feeds today as able - patient currently refusing - AGAIN    Sepsis secondary to MSSA bacteremia  - Per ID, cefazolin  2 g on HD days through 09/19/2024    Prolonged QT  - Monitor EKG every other day on antiemetics    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  POA  - Phosphorus trended down;  - Continue sevelamer       Fentanyl  positive:  Seen on drug screen.  - May be a false positive in the setting of Benadryl  use.  - Patient adamantly denies use.     Hypertension POA  Elevated into 180s today  - Continue home Coreg  and hydralazine   -Patient will try nifedipine  with clear liquid diet -now refusing all p.o. meds despite tolerating Jell-O, pudding, applesauce, apples, ice cream  -Monitor BPs     ESRD  On HD  Nephrology following     GERD: Chronic.    Continue Pepcid  and pantop     T2DM with gastroparesis. POA, chronic.  Last A1c of 6.1 on 9/17.    -Sliding scale insulin .      Chronic pain or anxiety/depression  -Duloxetine  and BuSpar .     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 Mesenteric MRA 01/22/24 with proximally and distally patent SMA and diffuse anasarca.   - will continue plavix  and ASA     Anemia: Trend.  Follow-up outpatient       Code status:   Full Code  DVT Prophylaxis: heparin   Anticipated Disposition: home once symptoms and BP improved       Review of Systems:   Pertinent symptoms noted in the HPI    Vital Signs:    Last 24hrs VS reviewed since prior progress note. Most recent are:  Vitals:    08/19/24 1745   BP: (!) 153/72   Pulse:    Resp:    Temp:    SpO2:        Physical Examination:     I had a face to face encounter with this patient and independently examined them on 08/19/2024 as outlined below:        General : alert and awake, no acute distress, up pacing room  HEENT: moist mucus membranes  Chest: CTAB, normal WOB  CVS: S1 and S2 heard, no m/g/r  Abd: non distended. Mild TTP  Ext: WWP, no edema  Neuro/Psych: no focal deficits  Skin: no rash      Labs and imaging:     Recent Labs     08/18/24  0643 08/19/24  0458   WBC 10.3 7.7   HGB 12.8 11.1*   HCT 41.5 34.8*   PLT 444* 444*     Recent Labs     08/17/24  0354 08/18/24  0643 08/19/24  0458   NA 135* 139 136   K 4.1 4.4 3.9   CL 96* 96* 97*   CO2 27 33* 29   BUN 17 12 18    MG 2.0  --   --    PHOS 2.7  --    --      Recent Labs     08/17/24  0354 08/18/24  0643 08/19/24  0458   ALT <5* <5* <5*   GLOB 3.4 3.1 3.2     No results for input(s): INR, APTT in the last 72 hours.    Invalid input(s): PTP   No results for input(s): TIBC in the last 72 hours.    Invalid input(s): FE, PSAT, FERR   No results found for: RBCF   No results for input(s): PH, PCO2, PO2 in the last 72 hours.  No results for input(s): CPK in the last 72 hours.    Invalid input(s): CPKMB, CKNDX, TROIQ  Lab Results   Component Value Date/Time    CHOL 192 08/07/2024 11:19 AM    HDL 80 08/07/2024 11:19 AM    LDL 78 08/07/2024 11:19 AM    LDL Not calculated due to elevated triglyceride level 09/16/2022 10:30 AM     No results found for: GLUCPOC  @LABUA @      US  EXTREMITY LEFT NON VASC LIMITED   Final Result   An ill-defined, heterogeneously hypoechoic mass anterior to the AV fistula graft   measures 6 x 12 x 16 mm. Internal blood flow cannot be determined due to   adjacent arterial flow causing artifact. Phlegmon or old hematoma are possible.   It is not a circumscribed, walled off abscess. The patient reports that this has   been palpable for 2 years, favoring old hematoma.         Electronically signed  by Medford Manners      MRI LUMBAR SPINE W WO CONTRAST   Final Result      Degenerative changes are conspicuous at the cervical spine.      Moderate to severe canal stenosis at C4-5 and moderate canal stenosis at C3-C4.      No other canal or foraminal compromise is identified.      There is no fracture or dislocation.      There is no cord signal abnormality or abnormal intrathecal enhancement. No   evidence of discitis/osteomyelitis.                     Electronically signed by JIMMY RABON      MRI THORACIC SPINE W WO CONTRAST   Final Result      Degenerative changes are conspicuous at the cervical spine.      Moderate to severe canal stenosis at C4-5 and moderate canal stenosis at C3-C4.      No other canal or foraminal  compromise is identified.      There is no fracture or dislocation.      There is no cord signal abnormality or abnormal intrathecal enhancement. No   evidence of discitis/osteomyelitis.                     Electronically signed by JIMMY RABON      MRI CERVICAL SPINE W WO CONTRAST   Final Result      Degenerative changes are conspicuous at the cervical spine.      Moderate to severe canal stenosis at C4-5 and moderate canal stenosis at C3-C4.      No other canal or foraminal compromise is identified.      There is no fracture or dislocation.      There is no cord signal abnormality or abnormal intrathecal enhancement. No   evidence of discitis/osteomyelitis.                     Electronically signed by JIMMY HABIB      CT Upper Extremity Left W Contrast   Final Result   Nonspecific 2 cm area of subcutaneous attenuation anterior to the   distal graft anastomosis. A defined collection is not shown. Further evaluation   with ultrasound at the distal anastomotic site of the graft is recommended to   exclude small abscess.         Electronically signed by Alm Edman, MD      XR CHEST PORTABLE   Final Result   No acute cardiopulmonary process.      Electronically signed by Harden Cahill      CT ABDOMEN PELVIS WO CONTRAST Additional Contrast? None   Final Result   No acute intraperitoneal process is identified.   Gastrojejunostomy.   Colonic diverticulosis with no diverticulitis.   Incidental and/or nonemergent findings are as described in detail above.         Electronically signed by JIMMY HABIB          08/06/24    ECHO (TTE) COMPLETE (PRN CONTRAST/BUBBLE/STRAIN/3D) 08/12/2024  1:21 PM (Final)    Interpretation Summary    Left Ventricle: Low normal left ventricular systolic function. EF by visual approximation is 55%. Left ventricle size is normal. Findings consistent with moderate concentric hypertrophy. Normal wall motion. Diastolic dysfunction present with normal LV EF.    Right Ventricle: Right ventricle size is  normal. Normal systolic function.    Aortic Valve: Mildly  calcified cusps. Mild sclerosis of the aortic valve cusps.    Mitral Valve: There is mild annular calcification noted.    Image quality is good.    Signed by: Vikas K Rathi, MD on 08/12/2024  1:21 PM        Level of Service Documentation     In caring for this patient today, I reviewed recent notes, interpreted lab results and imaging tests, had a face-to-face encounter with the patient, performing the medically necessary appropriate exam and history, counseled the patient/family/caregiver, ordered medications, tests and procedures as indicated for diagnostic and therapeutic purposes, consulted and communicated with other healthcare professionals including care coordination and discharge planning, and documented clinical information in the electronic health record.  ______________________________________________________________________                 Manuelita FORBES Epley, MD     "

## 2024-08-19 NOTE — Progress Notes (Signed)
"  At the start of my shift I had pts oxy switched over to oral concentrate that she would be able to take oral and hopefully absorb better. She seemed to tolerate that at the time. Then I attempted to restart her tube feed, but she was adamant that she could not tolerate it. Pt was eating chocolate pudding at the time and drinking gingerale. Within minutes of me administering her bedtime meds she vomited atleast 100 ml (she threw it in the trash). From what I could see it was the flush water  for the meds with a pink tinge related to the crushed meds. She had a 05 of vitamin B due at 2200. I offered to crush that a bit and put it on a full liquid and see if he can tolerate that one pill orally since she had seemingly tolerated the pudding. She did for a while but later on vomited 200, then another 100. She later threw up another 300 and then another 100. I held her oral meds since she would immediately vomit them back up within minutes.I did administer IV pain and nausea meds including the reglan  and we discussed the purpose of that. I explained to her that the GI doctor really wants her to retry the tube feedings since he felt like she was not actually vomiting up the tube feeds. She did walk up to the fridge and attempted to eat an apple. She did not get to eat it I stopped her and gave ger applesause. Last night for me she at 2 jellos, a can of gingerale, and an icecream. She seems to tolerate the oral intake longer than the PEG intake and the vomiting is not as projectile as when the intake goes into the PEG. This is my first time with her, I am back tonight and will continue to work with her and talk to Sharon her day nurse to see what the physicans suggest we can better do in support. She also refuses her heparin  which I educated the why related the risk for blood clots as the reason to administer.   "

## 2024-08-19 NOTE — Plan of Care (Signed)
 "  Problem: Chronic Conditions and Co-morbidities  Goal: Patient's chronic conditions and co-morbidity symptoms are monitored and maintained or improved  08/19/2024 1134 by Othel Ewings, RN  Outcome: Not Progressing  08/19/2024 0431 by Timmie Pica, RN  Outcome: Progressing     Problem: Discharge Planning  Goal: Discharge to home or other facility with appropriate resources  08/19/2024 1134 by Othel Ewings, RN  Outcome: Not Progressing  08/19/2024 0431 by Timmie Pica, RN  Outcome: Progressing     Problem: Pain  Goal: Verbalizes/displays adequate comfort level or baseline comfort level  08/19/2024 1134 by Othel Ewings, RN  Outcome: Not Progressing  08/19/2024 0431 by Timmie Pica, RN  Outcome: Progressing     Problem: Safety - Adult  Goal: Free from fall injury  08/19/2024 1134 by Othel Ewings, RN  Outcome: Not Progressing  08/19/2024 0431 by Timmie Pica, RN  Outcome: Progressing     Problem: Nutrition Deficit:  Goal: Optimize nutritional status  08/19/2024 1134 by Othel Ewings, RN  Outcome: Not Progressing  08/19/2024 0431 by Timmie Pica, RN  Outcome: Progressing     Problem: Gastrointestinal - Adult  Goal: Minimal or absence of nausea and vomiting  08/19/2024 1134 by Othel Ewings, RN  Outcome: Not Progressing  08/19/2024 0431 by Timmie Pica, RN  Outcome: Progressing  Goal: Maintains or returns to baseline bowel function  08/19/2024 1134 by Othel Ewings, RN  Outcome: Not Progressing  08/19/2024 0431 by Timmie Pica, RN  Outcome: Progressing  Goal: Maintains adequate nutritional intake  08/19/2024 1134 by Othel Ewings, RN  Outcome: Not Progressing  08/19/2024 0431 by Timmie Pica, RN  Outcome: Progressing     Problem: Skin/Tissue Integrity  Goal: Skin integrity remains intact  Description: 1.  Monitor for areas of redness and/or skin breakdown  2.  Assess vascular access sites hourly  3.  Every 4-6 hours minimum:  Change oxygen saturation probe site  4.  Every 4-6 hours:  If on nasal  continuous positive airway pressure, respiratory therapy assess nares and determine need for appliance change or resting period  08/19/2024 1134 by Othel Ewings, RN  Outcome: Not Progressing  08/19/2024 0431 by Timmie Pica, RN  Outcome: Progressing     Problem: Chronic Conditions and Co-morbidities  Goal: Patient's chronic conditions and co-morbidity symptoms are monitored and maintained or improved  08/19/2024 1134 by Othel Ewings, RN  Outcome: Not Progressing  08/19/2024 0431 by Timmie Pica, RN  Outcome: Progressing     Problem: Discharge Planning  Goal: Discharge to home or other facility with appropriate resources  08/19/2024 1134 by Othel Ewings, RN  Outcome: Not Progressing  08/19/2024 0431 by Timmie Pica, RN  Outcome: Progressing     Problem: Pain  Goal: Verbalizes/displays adequate comfort level or baseline comfort level  08/19/2024 1134 by Othel Ewings, RN  Outcome: Not Progressing  08/19/2024 0431 by Timmie Pica, RN  Outcome: Progressing     Problem: Safety - Adult  Goal: Free from fall injury  08/19/2024 1134 by Othel Ewings, RN  Outcome: Not Progressing  08/19/2024 0431 by Timmie Pica, RN  Outcome: Progressing     Problem: Nutrition Deficit:  Goal: Optimize nutritional status  08/19/2024 1134 by Othel Ewings, RN  Outcome: Not Progressing  08/19/2024 0431 by Timmie Pica, RN  Outcome: Progressing     Problem: Gastrointestinal - Adult  Goal: Minimal or absence of nausea and vomiting  08/19/2024 1134 by Othel Ewings, RN  Outcome: Not Progressing  08/19/2024 0431 by Timmie Pica, RN  Outcome: Progressing  Goal: Maintains or returns to baseline bowel function  08/19/2024 1134 by Othel Ewings, RN  Outcome: Not Progressing  08/19/2024 0431 by Timmie Pica, RN  Outcome: Progressing  Goal: Maintains adequate nutritional intake  08/19/2024 1134 by Othel Ewings, RN  Outcome: Not Progressing  08/19/2024 0431 by Timmie Pica, RN  Outcome: Progressing     Problem: Skin/Tissue  Integrity  Goal: Skin integrity remains intact  Description: 1.  Monitor for areas of redness and/or skin breakdown  2.  Assess vascular access sites hourly  3.  Every 4-6 hours minimum:  Change oxygen saturation probe site  4.  Every 4-6 hours:  If on nasal continuous positive airway pressure, respiratory therapy assess nares and determine need for appliance change or resting period  08/19/2024 1134 by Othel Ewings, RN  Outcome: Not Progressing  08/19/2024 0431 by Timmie Pica, RN  Outcome: Progressing     "

## 2024-08-19 NOTE — Plan of Care (Signed)
"    Problem: Chronic Conditions and Co-morbidities  Goal: Patient's chronic conditions and co-morbidity symptoms are monitored and maintained or improved  Outcome: Progressing     Problem: Discharge Planning  Goal: Discharge to home or other facility with appropriate resources  Outcome: Progressing     Problem: Pain  Goal: Verbalizes/displays adequate comfort level or baseline comfort level  Outcome: Progressing     Problem: Safety - Adult  Goal: Free from fall injury  Outcome: Progressing     Problem: Nutrition Deficit:  Goal: Optimize nutritional status  Outcome: Progressing     Problem: Gastrointestinal - Adult  Goal: Minimal or absence of nausea and vomiting  Outcome: Progressing  Goal: Maintains or returns to baseline bowel function  Outcome: Progressing  Goal: Maintains adequate nutritional intake  Outcome: Progressing     Problem: Skin/Tissue Integrity  Goal: Skin integrity remains intact  Description: 1.  Monitor for areas of redness and/or skin breakdown  2.  Assess vascular access sites hourly  3.  Every 4-6 hours minimum:  Change oxygen saturation probe site  4.  Every 4-6 hours:  If on nasal continuous positive airway pressure, respiratory therapy assess nares and determine need for appliance change or resting period  Outcome: Progressing     "

## 2024-08-20 LAB — COMPREHENSIVE METABOLIC PANEL
ALT: <5 U/L — ABNORMAL LOW (ref 10–35)
AST: 46 U/L — ABNORMAL HIGH (ref 10–35)
Albumin/Globulin Ratio: 0.8 — ABNORMAL LOW (ref 1.1–2.2)
Albumin: 3.3 g/dL — ABNORMAL LOW (ref 3.5–5.2)
Alk Phosphatase: 146 U/L — ABNORMAL HIGH (ref 35–104)
Anion Gap: 14 mmol/L (ref 2–14)
BUN/Creatinine Ratio: 4 — ABNORMAL LOW (ref 12–20)
BUN: 23 mg/dL — ABNORMAL HIGH (ref 6–20)
CO2: 29 mmol/L (ref 20–29)
Calcium: 9.2 mg/dL (ref 8.6–10.0)
Chloride: 98 mmol/L (ref 98–107)
Creatinine: 5.89 mg/dL — ABNORMAL HIGH (ref 0.60–1.00)
Est, Glom Filt Rate: 9 ml/min/1.73m2 — ABNORMAL LOW (ref >59)
Globulin: 3.9 g/dL (ref 2.0–4.0)
Glucose: 151 mg/dL — ABNORMAL HIGH (ref 65–100)
Potassium: 4.1 mmol/L (ref 3.5–5.1)
Sodium: 141 mmol/L (ref 136–145)
Total Bilirubin: 0.4 mg/dL (ref 0.0–1.2)
Total Protein: 7.3 g/dL (ref 6.4–8.3)

## 2024-08-20 LAB — CBC WITH AUTO DIFFERENTIAL
Basophils %: 0.9 % (ref 0.0–1.0)
Basophils Absolute: 0.07 K/UL (ref 0.00–0.10)
Eosinophils %: 1.9 % (ref 0.0–7.0)
Eosinophils Absolute: 0.15 K/UL (ref 0.00–0.40)
Hematocrit: 40 % (ref 35.0–47.0)
Hemoglobin: 12.5 g/dL (ref 11.5–16.0)
Immature Granulocytes %: 0.6 % — ABNORMAL HIGH (ref 0.0–0.5)
Immature Granulocytes Absolute: 0.05 K/UL — ABNORMAL HIGH (ref 0.00–0.04)
Lymphocytes %: 18.5 % (ref 12.0–49.0)
Lymphocytes Absolute: 1.48 K/UL (ref 0.80–3.50)
MCH: 30.6 pg (ref 26.0–34.0)
MCHC: 31.3 g/dL (ref 30.0–36.5)
MCV: 98 FL (ref 80.0–99.0)
Monocytes %: 8.9 % (ref 5.0–13.0)
Monocytes Absolute: 0.71 K/UL (ref 0.00–1.00)
Neutrophils %: 69.2 % (ref 32.0–75.0)
Neutrophils Absolute: 5.54 K/UL (ref 1.80–8.00)
Nucleated RBCs: 0 /100{WBCs}
Platelets: 508 K/uL — ABNORMAL HIGH (ref 150–400)
RBC: 4.08 M/uL (ref 3.80–5.20)
RDW: 17.3 % — ABNORMAL HIGH (ref 11.5–14.5)
WBC: 8 K/uL (ref 3.6–11.0)
nRBC: 0 K/uL (ref 0.00–0.01)

## 2024-08-20 LAB — POCT GLUCOSE
POC Glucose: 201 mg/dL — ABNORMAL HIGH (ref 65–117)
POC Glucose: 188 mg/dL — ABNORMAL HIGH (ref 65–117)
POC Glucose: 219 mg/dL — ABNORMAL HIGH (ref 65–117)

## 2024-08-20 MED ORDER — PROCHLORPERAZINE EDISYLATE 10 MG/2ML IJ SOLN
10 | Freq: Four times a day (QID) | INTRAMUSCULAR | Status: DC
Start: 2024-08-20 — End: 2024-08-25

## 2024-08-20 MED ORDER — ONDANSETRON HCL 4 MG/2ML IJ SOLN
4 | Freq: Four times a day (QID) | INTRAMUSCULAR | Status: DC
Start: 2024-08-20 — End: 2024-08-25
  Administered 2024-08-20: 20:00:00 4 mg via INTRAVENOUS

## 2024-08-20 MED FILL — DICYCLOMINE HCL 10 MG PO CAPS: 10 mg | ORAL | Qty: 1 | Fill #0

## 2024-08-20 MED FILL — HYDROMORPHONE HCL 1 MG/ML IJ SOLN: 1 mg/mL | INTRAMUSCULAR | Qty: 1 | Fill #0

## 2024-08-20 MED FILL — ATORVASTATIN CALCIUM 20 MG PO TABS: 20 mg | ORAL | Qty: 4 | Fill #0

## 2024-08-20 MED FILL — CLOPIDOGREL BISULFATE 75 MG PO TABS: 75 mg | ORAL | Qty: 1 | Fill #0

## 2024-08-20 MED FILL — METOCLOPRAMIDE HCL 5 MG/ML IJ SOLN: 5 mg/mL | INTRAMUSCULAR | Qty: 2 | Fill #0

## 2024-08-20 MED FILL — PANTOPRAZOLE SODIUM 40 MG IV SOLR: 40 mg | INTRAVENOUS | Qty: 40 | Fill #0

## 2024-08-20 MED FILL — ACETAMINOPHEN EXTRA STRENGTH 500 MG PO TABS: 500 mg | ORAL | Qty: 2 | Fill #0

## 2024-08-20 MED FILL — NIFEDIPINE ER OSMOTIC RELEASE 30 MG PO TB24: 30 mg | ORAL | Qty: 3 | Fill #0

## 2024-08-20 MED FILL — ONDANSETRON HCL 4 MG/2ML IJ SOLN: 4 MG/2ML | INTRAMUSCULAR | Qty: 2 | Fill #0

## 2024-08-20 MED FILL — INSULIN LISPRO 100 UNIT/ML IJ SOLN: 100 [IU]/mL | INTRAMUSCULAR | Qty: 2 | Fill #0

## 2024-08-20 MED FILL — DULOXETINE HCL 30 MG PO CPEP: 30 mg | ORAL | Qty: 2 | Fill #0

## 2024-08-20 MED FILL — CARVEDILOL 12.5 MG PO TABS: 12.5 mg | ORAL | Qty: 2 | Fill #0

## 2024-08-20 MED FILL — SEVELAMER CARBONATE 0.8 G PO PACK: 0.8 g | ORAL | Qty: 1 | Fill #0

## 2024-08-20 MED FILL — SENNOSIDES-DOCUSATE SODIUM 8.6-50 MG PO TABS: 8.6-50 mg | ORAL | Qty: 1 | Fill #0

## 2024-08-20 MED FILL — FAMOTIDINE 20 MG PO TABS: 20 mg | ORAL | Qty: 1 | Fill #0

## 2024-08-20 MED FILL — VITAMIN B-6 50 MG PO TABS: 50 mg | ORAL | Qty: 1 | Fill #0

## 2024-08-20 MED FILL — PROCHLORPERAZINE EDISYLATE 10 MG/2ML IJ SOLN: 10 MG/2ML | INTRAMUSCULAR | Qty: 2 | Fill #0

## 2024-08-20 MED FILL — HEPARIN SODIUM (PORCINE) 5000 UNIT/ML IJ SOLN: 5000 [IU]/mL | INTRAMUSCULAR | Qty: 1 | Fill #0

## 2024-08-20 MED FILL — SODIUM BICARBONATE 650 MG PO TABS: 650 mg | ORAL | Qty: 1 | Fill #0

## 2024-08-20 MED FILL — HYDRALAZINE HCL 25 MG PO TABS: 25 mg | ORAL | Qty: 4 | Fill #0

## 2024-08-20 MED FILL — BUSPIRONE HCL 10 MG PO TABS: 10 mg | ORAL | Qty: 1 | Fill #0

## 2024-08-20 MED FILL — CEFAZOLIN SODIUM 1 G IJ SOLR: 1 g | INTRAMUSCULAR | Qty: 1000 | Fill #0

## 2024-08-20 MED FILL — SODIUM CHLORIDE (PF) 0.9 % IJ SOLN: 0.9 % | INTRAMUSCULAR | Qty: 10 | Fill #0

## 2024-08-20 MED FILL — OXYCODONE HCL 10 MG/0.5ML PO CONC: 10 MG/0.5ML | ORAL | Qty: 0.5 | Fill #0

## 2024-08-20 MED FILL — LABETALOL HCL 5 MG/ML IV SOLN: 5 mg/mL | INTRAVENOUS | Qty: 4 | Fill #0

## 2024-08-20 MED FILL — ASPIRIN 81 MG PO CHEW: 81 mg | ORAL | Qty: 1 | Fill #0

## 2024-08-20 NOTE — Progress Notes (Signed)
 "        [employer]                                                                                          Hospitalist Progress Note  Manuelita FORBES Epley, MD  Office Phone: [phone]        Date of Service:  08/20/2024  NAME:  Melinda [name]  DOB:  1981-01-14  MRN:  [id number]     HPI:     Follow-up gastroparesis.  I appreciate the note written by Glade Bough.  I have scheduled her Compazine  and Zofran .  It appears that she took and 3 applesauce's, a cup of water , ginger ale, 2 juices, 2 cups of coffee, and some ice cream last night.  While rounding at the nursing station today patient stops by the coffee bar to get a cup of coffee and graham crackers.  She is not having any nausea or vomiting at the time I evaluate her.  She is hesitant, but does finally agree to attempt J-tube feeds again.  I discussed with her that I have scheduled her meds, and that the J-tube goes past her stomach so that when she is vomiting it is the food that she is eating, and not the tube feeds that she is vomiting.  Patient agrees to attempt feeds      If tolerated, patient can discharge tomorrow.  Dialysis is aware of her cefazolin  orders     Assessment & Plan:     43 year old female with ESRD, type 2 diabetes, hypertension, admitted with acute on chronic vomiting and abdominal pain likely secondary to gastroparesis.    Gastroparesis with emesis abdominal pain, status post GJ tube  - Per GI note, patient has intractable vomiting that is difficult to treat, but should continue to use her GJ tube  - Holding off on catheter for long-term TPN  - Make every effort to maintain nutrition through tube feeds  - Eating the above foods including pudding, applesauce, ice cream, and a full apple twice this weekend  - PPI and Pepcid   - Continue scheduled Reglan   - Continue scopolamine  patch  - bowel reg  - Resume tube feeds today as patient is now agreeing to attempt them    Sepsis secondary to MSSA bacteremia  -  Per ID, cefazolin  2 g on HD days through 09/19/2024    Prolonged QT  - Monitor EKG every other day on antiemetics    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  POA  - Phosphorus trended down;  - Continue sevelamer       Fentanyl  positive: Seen on drug screen.  - May be a false positive in the setting of Benadryl  use.  - Patient adamantly denies use.     Hypertension POA  Elevated into 180s today  - Continue home Coreg  and hydralazine   -Patient will try nifedipine  with clear liquid diet -now refusing all p.o. meds despite tolerating Jell-O, pudding, applesauce, apples, ice cream  -Monitor BPs     ESRD  On HD  Nephrology following     GERD: Chronic.  Continue Pepcid  and pantop     T2DM with gastroparesis. POA, chronic.  Last A1c of 6.1 on 9/17.    -Sliding scale insulin .      Chronic pain or anxiety/depression  -Duloxetine  and BuSpar .     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 Mesenteric MRA 01/22/24 with proximally and distally patent SMA and diffuse anasarca.   - will continue plavix  and ASA     Anemia: Trend.  Follow-up outpatient       Code status:   Full Code  DVT Prophylaxis: heparin   Anticipated Disposition: home once symptoms and BP improved       Review of Systems:   Pertinent symptoms noted in the HPI    Vital Signs:    Last 24hrs VS reviewed since prior progress note. Most recent are:  Vitals:    08/20/24 1125   BP: (!) 150/80   Pulse: (!) 101   Resp: 16   Temp: 98.4 F (36.9 C)   SpO2: 99%       Physical Examination:     I had a face to face encounter with this patient and independently examined them on 08/20/2024 as outlined below:        General : alert and awake, no acute distress, up pacing room  HEENT: moist mucus membranes  Chest: CTAB, normal WOB  CVS: S1 and S2 heard, no m/g/r  Abd: non distended. Mild TTP  Ext: WWP, no edema  Neuro/Psych: no focal deficits  Skin: no rash      Labs and imaging:     Recent  Labs     08/19/24  0458 08/20/24  0519   WBC 7.7 8.0   HGB 11.1* 12.5   HCT 34.8* 40.0   PLT 444* 508*     Recent Labs     08/18/24  0643 08/19/24  0458 08/20/24  0519   NA 139 136 141   K 4.4 3.9 4.1   CL 96* 97* 98   CO2 33* 29 29   BUN 12 18 23*     Recent Labs     08/18/24  0643 08/19/24  0458 08/20/24  0519   ALT <5* <5* <5*   GLOB 3.1 3.2 3.9     No results for input(s): INR, APTT in the last 72 hours.    Invalid input(s): PTP   No results for input(s): TIBC in the last 72 hours.    Invalid input(s): FE, PSAT, FERR   No results found for: RBCF   No results for input(s): PH, PCO2, PO2 in the last 72 hours.  No results for input(s): CPK in the last 72 hours.    Invalid input(s): CPKMB, CKNDX, TROIQ  Lab Results   Component Value Date/Time    CHOL 192 08/07/2024 11:19 AM    HDL 80 08/07/2024 11:19 AM    LDL 78 08/07/2024 11:19 AM    LDL Not calculated due to elevated triglyceride level 09/16/2022 10:30 AM     No results found for: GLUCPOC  @LABUA @      US  EXTREMITY LEFT NON VASC LIMITED   Final Result   An ill-defined, heterogeneously hypoechoic mass anterior to the AV fistula graft   measures 6 x 12 x 16 mm. Internal blood flow cannot be determined due to   adjacent arterial flow causing artifact. Phlegmon or old hematoma are possible.  It is not a circumscribed, walled off abscess. The patient reports that this has   been palpable for 2 years, favoring old hematoma.         Electronically signed by Medford Manners      MRI LUMBAR SPINE W WO CONTRAST   Final Result      Degenerative changes are conspicuous at the cervical spine.      Moderate to severe canal stenosis at C4-5 and moderate canal stenosis at C3-C4.      No other canal or foraminal compromise is identified.      There is no fracture or dislocation.      There is no cord signal abnormality or abnormal intrathecal enhancement. No   evidence of discitis/osteomyelitis.                     Electronically signed by JIMMY RABON      MRI THORACIC SPINE W WO CONTRAST   Final Result      Degenerative changes are conspicuous at the cervical spine.      Moderate to severe canal stenosis at C4-5 and moderate canal stenosis at C3-C4.      No other canal or foraminal compromise is identified.      There is no fracture or dislocation.      There is no cord signal abnormality or abnormal intrathecal enhancement. No   evidence of discitis/osteomyelitis.                     Electronically signed by JIMMY RABON      MRI CERVICAL SPINE W WO CONTRAST   Final Result      Degenerative changes are conspicuous at the cervical spine.      Moderate to severe canal stenosis at C4-5 and moderate canal stenosis at C3-C4.      No other canal or foraminal compromise is identified.      There is no fracture or dislocation.      There is no cord signal abnormality or abnormal intrathecal enhancement. No   evidence of discitis/osteomyelitis.                     Electronically signed by JIMMY HABIB      CT Upper Extremity Left W Contrast   Final Result   Nonspecific 2 cm area of subcutaneous attenuation anterior to the   distal graft anastomosis. A defined collection is not shown. Further evaluation   with ultrasound at the distal anastomotic site of the graft is recommended to   exclude small abscess.         Electronically signed by Alm Edman, MD      XR CHEST PORTABLE   Final Result   No acute cardiopulmonary process.      Electronically signed by Harden Cahill      CT ABDOMEN PELVIS WO CONTRAST Additional Contrast? None   Final Result   No acute intraperitoneal process is identified.   Gastrojejunostomy.   Colonic diverticulosis with no diverticulitis.   Incidental and/or nonemergent findings are as described in detail above.         Electronically signed by JIMMY HABIB          08/06/24    ECHO (TTE) COMPLETE (PRN CONTRAST/BUBBLE/STRAIN/3D) 08/12/2024  1:21 PM (Final)    Interpretation Summary    Left Ventricle: Low normal left ventricular systolic  function. EF by visual approximation is 55%. Left ventricle size is normal. Findings  consistent with moderate concentric hypertrophy. Normal wall motion. Diastolic dysfunction present with normal LV EF.    Right Ventricle: Right ventricle size is normal. Normal systolic function.    Aortic Valve: Mildly calcified cusps. Mild sclerosis of the aortic valve cusps.    Mitral Valve: There is mild annular calcification noted.    Image quality is good.    Signed by: Vikas K Rathi, MD on 08/12/2024  1:21 PM        Level of Service Documentation     In caring for this patient today, I reviewed recent notes, interpreted lab results and imaging tests, had a face-to-face encounter with the patient, performing the medically necessary appropriate exam and history, counseled the patient/family/caregiver, ordered medications, tests and procedures as indicated for diagnostic and therapeutic purposes, consulted and communicated with other healthcare professionals including care coordination and discharge planning, and documented clinical information in the electronic health record.  ______________________________________________________________________                 Manuelita FORBES Epley, MD     "

## 2024-08-20 NOTE — Care Coordination (Signed)
"  Care Management Progress Note        Reason for Admission:   Hypertensive emergency [I16.1]  Intractable nausea and vomiting [R11.2]         Patient Admission Status: Inpatient  RUR: 39%  Hospitalization in the last 30 days (Readmission):  No        Transition of care plan:  Patient was discussed in IDR and continues to be medically managed.    Dispo: return home with family.    HD: CM has called to the [facility] [phone], spoke with Rolan and provided clinical updates. CM has faxed clinical updates to the center at [phone].     Discharge plan communicated with patient and/or discharge caregiver: Yes      Date 1st IMM letter given: N/a. Patient has a comptroller.     Outpatient follow-up.    Transport at discharge: family.          ___________________________________________   Joan Pond, RN Case Manager  08/20/2024   2:45 PM       "

## 2024-08-20 NOTE — Plan of Care (Signed)
"    Problem: Safety - Adult  Goal: Free from fall injury  08/20/2024 1307 by Mirpuristokes, Seabron, RN  Outcome: Progressing  08/20/2024 0434 by Timmie Pica, RN  Outcome: Progressing     Problem: Nutrition Deficit:  Goal: Optimize nutritional status  08/20/2024 1307 by Phyllis Seabron, RN  Outcome: Progressing  08/20/2024 0434 by Timmie Pica, RN  Outcome: Progressing     Problem: Skin/Tissue Integrity  Goal: Skin integrity remains intact  Description: 1.  Monitor for areas of redness and/or skin breakdown  2.  Assess vascular access sites hourly  3.  Every 4-6 hours minimum:  Change oxygen saturation probe site  4.  Every 4-6 hours:  If on nasal continuous positive airway pressure, respiratory therapy assess nares and determine need for appliance change or resting period  08/20/2024 1307 by Mirpuristokes, Seabron, RN  Outcome: Progressing  08/20/2024 0434 by Timmie Pica, RN  Outcome: Progressing     "

## 2024-08-20 NOTE — Progress Notes (Addendum)
 "                             [facility]  Date of Birth: Aug 16, 1981          Assessment & Plan:     ESRD on HD MWF via [facility], [facility], f/b Dr. Kalvin  Hypertensive urgency-better after HD  DM  AOCD  Chronic abd pain, n/v from gastroparesis-reason for admit  Staph bacteremia cxs now negative     Plan:  Seen on HD this am  HD MWF  ESA MWF  Needs cefazolin  2/2/3 post HD until 09/19/24-spoke with her clinic and they are aware       Subjective:   CC: ESRD  HPI: Seen on HD  Bps 200s systolic>130s on HD    Current Facility-Administered Medications   Medication Dose Route Frequency    dextrose  bolus 10% 125 mL  125 mL IntraVENous PRN    Or    dextrose  bolus 10% 250 mL  250 mL IntraVENous PRN    glucagon  injection 1 mg  1 mg SubCUTAneous PRN    dextrose  10 % infusion   IntraVENous Continuous PRN    hydrALAZINE  (APRESOLINE ) tablet 100 mg  100 mg Per G Tube TID    glucose chewable tablet 16 g  4 tablet Per G Tube PRN    HYDROmorphone  HCl PF (DILAUDID ) injection 0.5 mg  0.5 mg IntraVENous Q4H PRN    Or    HYDROmorphone  HCl PF (DILAUDID ) injection 1 mg  1 mg IntraVENous Q4H PRN    labetalol  (NORMODYNE ;TRANDATE ) injection 10 mg  10 mg IntraVENous Q4H PRN    acetaminophen  (TYLENOL ) tablet 1,000 mg  1,000 mg PEG Tube 4 times per day    dicyclomine  (BENTYL ) capsule 10 mg  10 mg PEG Tube TID AC    polyethylene glycol (GLYCOLAX ) packet 17 g  17 g Per G Tube Daily PRN    sevelamer  (RENVELA ) packet 0.8 g  0.8 g Per G Tube TID WC    sodium bicarbonate  tablet 650 mg  650 mg PEG Tube TID    oxyCODONE  (ROXICODONE  INTENSOL) 100 MG/5ML concentrated solution 5 mg  5 mg Per G Tube Q4H PRN    pantoprazole  (PROTONIX ) 40 mg in sodium chloride  (PF) 0.9 % 10 mL injection  40 mg IntraVENous Q12H    metoclopramide  (REGLAN ) injection 5 mg  5 mg IntraVENous TID AC    ondansetron  (ZOFRAN ) 4 MG/5ML solution 4 mg  4 mg Per G Tube Q6H PRN    Or    ondansetron  (ZOFRAN ) injection 4 mg  4 mg IntraVENous  Q6H PRN    bisacodyl  (DULCOLAX) suppository 10 mg  10 mg Rectal Daily PRN    NIFEdipine  (PROCARDIA  XL) extended release tablet 90 mg  90 mg Oral Daily    [Held by provider] epoetin  alfa-epbx (RETACRIT ) injection 10,000 Units  10,000 Units SubCUTAneous Q MWF    aspirin  chewable tablet 81 mg  81 mg Per G Tube Daily    atorvastatin  (LIPITOR ) tablet 80 mg  80 mg Per G Tube QHS    busPIRone  (BUSPAR ) tablet 5 mg  5 mg Per G Tube BID    clopidogrel  (PLAVIX ) tablet 75 mg  75 mg Per G Tube Daily    DULoxetine  (CYMBALTA ) extended release capsule 60 mg  60 mg Per G Tube Daily    famotidine  (PEPCID ) tablet 20 mg  20 mg Per G Tube Daily    sennosides-docusate sodium  (SENOKOT-S) 8.6-50 MG tablet 1 tablet  1 tablet Per G Tube BID    vitamin B-6 (PYRIDOXINE ) tablet 25 mg  25 mg Per G Tube Q6H    ceFAZolin  (ANCEF ) 1,000 mg in sterile water  10 mL IV syringe  1,000 mg IntraVENous Daily    sodium chloride  flush 0.9 % injection 5-40 mL  5-40 mL IntraVENous 2 times per day    sodium chloride  flush 0.9 % injection 5-40 mL  5-40 mL IntraVENous PRN    0.9 % sodium chloride  infusion   IntraVENous PRN    carvedilol  (COREG ) tablet 25 mg  25 mg Per G Tube BID WC    scopolamine  (TRANSDERM-SCOP) transdermal patch 1 patch  1 patch TransDERmal Q72H    albumin  human 25% IV solution 25 g  25 g IntraVENous PRN    prochlorperazine  (COMPAZINE ) injection 10 mg  10 mg IntraVENous Q6H PRN    0.9 % sodium chloride  infusion   IntraVENous PRN    heparin  (porcine) injection 5,000 Units  5,000 Units SubCUTAneous 3 times per day    insulin  lispro (HUMALOG ,ADMELOG ) injection vial 0-8 Units  0-8 Units SubCUTAneous 4x Daily AC & HS          Objective:     Vitals:  Blood pressure (!) 155/85, pulse (!) 102, temperature 98.2 F (36.8 C), resp. rate 17, height 1.499 m (4' 11.02), weight 54.6 kg (120 lb 5.9 oz), SpO2 98%.  Temp (24hrs), Avg:98 F (36.7 C), Min:97.5 F (36.4 C), Max:98.4 F (36.9 C)      Intake and Output:  11/10 0701 - 11/10 1900  In: 1500  [P.O.:1000]  Out: 3356 [Urine:200]  11/08 1901 - 11/10 0700  In: 910 [P.O.:740]  Out: 1150 [Urine:250]    Physical Exam:               GENERAL ASSESSMENT: NAD, resting in bed  HEENT: Nontraumatic   EXTREMITY: no EDEMA; LUE AVG NT, no swelling/warmth, good t/b  NEURO: Grossly non focal          ECG/rhythm:    Data Review        Recent Labs     08/18/24  0643 08/19/24  0458 08/20/24  0519   NA 139 136 141   K 4.4 3.9 4.1   CL 96* 97* 98   CO2 33* 29 29   BUN 12 18 23*   CREATININE 3.74* 4.74* 5.89*   GLUCOSE 134* 191* 151*   CALCIUM  9.1 8.5* 9.2       : Melinda [name] Melinda [name]  08/20/2024        [facility]:  [email or web address]  [email or web address]    [place] office:  [address] [place], [address]  [place], [place] [address]  Phone: [phone]  Fax :     [phone]    [facility] office:  [address]  [place], [place]  [address]  Phone - [phone]  Fax - [phone]                                                                                        "

## 2024-08-20 NOTE — Progress Notes (Signed)
"  Patient has vomited multiple times throughout the day. She has filled 6 emesis bags today. She has scheduled Zofran  and compazine , which seemed to work towards the end of my shift. She was also started on tube feeds going at 78ml/hr, she seems to be tolerating it well. She has only has one episode of vomiting within a two hour window. Patient no longer has IV access, reaching out to the attending physician to see if a midline would be deemed better at this time.   "

## 2024-08-20 NOTE — Progress Notes (Signed)
 "Comprehensive Nutrition Assessment    Type and Reason for Visit: Reassess    Nutrition Recommendations/Plan:     Provide PPN per below:   Day 1: 1008 mL, 50 g carbs, 42 g protein (D5, AA4.25% @ 42 mL/hour)  Day 2: 1512 mL, 76 g carbs, 64 g protein (D5, AA4.25% @ 63 mL/hour)  Day 3: 1800 mL, 90 g carbs, 77 g protein (D5, AA4.25% @ 75 mL/hour (goal)    - Lipids: Check triglycerides and if <400, add lipids per below  - Lipids 20% 500mL 3x/week  - Monitor K, Phos, Mg until stable x 3 days with nutrition, replace PRN  - Hold PN at current rate if electrolytes are not stable    2.   Continue to trial PO as pt desires     3.   Continue to encourage acceptance of enteral feeds per below:   - Nepro @ 35 mL/hour  - FWF 75 mL q 2 hours        Malnutrition Assessment:  Malnutrition Status:  Insufficient data (08/07/24 1316)    Context:  Chronic Illness     Findings of the 6 clinical characteristics of malnutrition:  Energy Intake:  Mild decrease in energy intake  Weight Loss:  Mild weight loss     Body Fat Loss:  Unable to assess     Muscle Mass Loss:  Unable to assess    Fluid Accumulation:  No fluid accumulation    Grip Strength:  Not Performed     Nutrition Assessment:    43 yo female admitted for HTN emergency, N/V. Pmhx: DM, Gastroparesis, HTN, ESRD - on HD, GJ tube.      11.10: Follow Up. Last Friday, pt became adamant about refusing TF and only wishing to trial PO diet. This was done over the weekend and pt has continued with N/V 1-2 hours after postprandial - regardless of what's eaten. Continues to refuse TF this AM and is still unable to tolerate PO w/o N/V soon after. Pt appropriate for PN at this point. Labs reviewed. Will reach out to attending regarding PN ordering.     PPN at goal rate 75 mL/hour with lipids 3x/week provides 1038 kcal (76% needs); 77 g protein (100% needs)  _____________________________________________________    11.07: Follow Up. TF ran yesterday at 35 mL/hr goal - stopped overnight after  emesis. This occurred after diet was advanced to regular per pt request and a full meal was eaten. Likely too ambitious after NPO x7 days. Given jejunum placement of TF, I doubt it alone caused emesis especially at low rate of feeds. Was likely combination of full meal + TF ISO of gastroparesis and prior period of NPO. Advocated for continuing TF at goal rate and restricting diet to full liquids. Need to take diet advancement slow to assess tolerance. No family in room bedside when I stopped by - will plan to check after noon. Will continue TF @ 35 mL/hr and monitor tolerance. Labs reviewed, lytes WNL.    11/6: Follow up. TF has not been ran since morning of 11.05. PN order placed but has not been delivered due to inadequate access. GI assessment yesterday concluded small frequent emesis is not composed of or caused by tube feeds. Will re-start TF today. Modifying order from cyclic to continuous and will attempt to meet needs with TF alone. Per nurse, small emesis has continued this AM - clear/mucus. Will trial continuous TF today and reassess tomorrow.     Trickle feeds  Nepro @ 35 mL/hour provides 1386 kcal (100% needs),123 g carbs, 62 g protein (90% needs) TF + FWF provides 1384 H2O/day.     11/5:  Follow up.  TF ran over night 11/3 as Nepro at 30 ml/hr x 10 hours which she tolerated with no complaints.  Last night we increased the rate to 40 ml/hr and she had n/v.  NPO now.  GI to see.  Spoke with daughter who states feeding was administered through J-port.  Will need to start PN until pt able to tolerate PO/TF.    PPN rec's:  goal rate of D5, 4.25% AA at 83 ml/hr + 250 ml 20% lipids MWF (TG level of 352).    This will provide 890 kcals (65% kcal needs), 85 gm protein (100% protein needs)    11/3: Follow up.  Pt remains NPO.  Have reached out to MD regarding need to start nutrition support, J-tube feeds vs PPN.  Still having N/V.  No new weight. TF rec's below in bold.      10/30:  Follow up.  Pt remains NPO.   Will recommend starting TF via J port.  Still having N/V.  No new weight.      10/28: MD consult received for TF rec's. Chart review indicate G-J tube placed in August 2025 and started on cyclic TF.  Spoke with pt's daughter at bedside.  She reports pt eats by mouth at home.  Some days she has n/v and is not able to tolerate PO so she gives herself 3 bolus feeds of NovaSource Renal 2.0 3x/day.  Pt and daughter unsure how much she gives herself, 2 large syringes full of feed.  On days when she eats and tolerates PO then she does not use feeding tube or might do one feed at night.  When I asked what foods she eats by mouth, she told me soups, cooked vegetables, and fruits.  Discussed low fiber diet necessary for gastroparesis, recommended all vegetables be cooked very soft, no beans in her soups, and fruits be canned.  Daughter states she has been losing weight, unable to quantify.  Weight hx in EMR indicates 6% loss over last 5 months which is not significant for time frame.  Weight seems to fluctuate between 115-135 lbs over last 1.5 years.       Pt currently NPO, MD wants to hold off on starting TF.  Pt was actively vomiting during my visit.  Did not complete NFPE because of this.      TF rec's when ready to start TF:  Nepro at 40 ml/hr x 10 hours + 1 ProSource daily + 30 ml h20 flush q4h around the clock.  This will provide 400 ml, 800 kcals (59% kcal needs), 52 gm protein (74% protein needs), 64 gm CHO, 290 + 30 + 180 = 500 ml water           Nutritionally Significant Medications:  NaCl at 100 ml/hr; Epo, Renvela , Vit B6    Estimated Daily Nutrient Needs:  Energy Requirements Based On: Kcal/kg  Weight Used for Energy Requirements: Current  Energy (kcal/day): 1365-1640 (25-30 kcals/kg)  Weight Used for Protein Requirements: Current  Protein (g/day): 70 (1.3 gm/kg)  Method Used for Fluid Requirements: 1 ml/kcal  Fluid (ml/day): 1365    Nutrition Related Findings:   Edema: None   Edema Generalized: Trace  RUE Edema:  Trace           Recent Labs     08/18/24  9356 08/19/24  0458 08/20/24  0519   GLUCOSE 134* 191* 151*   BUN 12 18 23*   CREATININE 3.74* 4.74* 5.89*   NA 139 136 141   K 4.4 3.9 4.1   CL 96* 97* 98   CO2 33* 29 29   CALCIUM  9.1 8.5* 9.2     Recent Labs     08/17/24  1420 08/17/24  1828 08/18/24  0010 08/18/24  0608 08/18/24  0825 08/18/24  1147 08/18/24  1705 08/18/24  2058 08/19/24  0928 08/19/24  1313 08/19/24  1605 08/19/24  1947   POCGLU 140* 195* 225* 133* 203* 133* 168* 265* 214* 106 178* 201*     Lab Results   Component Value Date/Time    LABA1C 6.1 06/27/2024 05:10 AM    LABA1C 7.0 02/19/2024 05:02 AM    LABA1C 6.8 10/24/2023 03:52 AM    EAG 127 06/27/2024 05:10 AM     Triglycerides   Date Value Ref Range Status   08/14/2024 352 (H) 0 - 150 MG/DL Final     Comment:     Borderline High: 150-199 mg/dL, High: 799-500 mg/dL  Very High: Greater than or equal to 500 mg/dL     89/71/7974 828 (H) 0 - 150 MG/DL Final     Comment:     Borderline High: 150-199 mg/dL, High: 799-500 mg/dL  Very High: Greater than or equal to 500 mg/dL     98/98/7974 844 (H) <150 MG/DL Final     Comment:     Based on NCEP-ATP III:  Triglycerides <150 mg/dL  is considered normal, 150-199 mg/dL  borderline high,  799-500 mg/dL high and  greater than or equal to 500 mg/dL very high.       Last BM: 08/11/24    Wounds: Wound Type: None    Current Nutrition Therapies:  Diet: NPO  Supplements: none  Meal Intake:   No data found.  Supplement Intake:  No data found.  Nutrition Support: none    Anthropometric Measures:  Height: 149.9 cm (4' 11.02)  Ideal Body Weight (IBW): 95 lbs (43 kg)       Current Body Weight: 54.6 kg (120 lb 5.9 oz), 126.7 % IBW. Weight Source: Bed scale  Current BMI (kg/m2): 24.3        Weight Adjustment For: No Adjustment                 BMI Categories: Normal Weight (BMI 18.5-24.9)    Wt Readings from Last 10 Encounters:   08/06/24 54.6 kg (120 lb 5.9 oz)   07/20/24 57.2 kg (126 lb 1.7 oz)   07/11/24 54.6 kg (120 lb 5.9  oz)   07/09/24 55.5 kg (122 lb 5.7 oz)   03/06/24 57.6 kg (127 lb)   02/21/24 58 kg (127 lb 13.9 oz)   11/28/23 52.2 kg (115 lb 1.3 oz)   10/31/23 52.6 kg (115 lb 15.4 oz)   10/21/23 55.2 kg (121 lb 11.1 oz)   10/12/23 60.4 kg (133 lb 3.2 oz)       Nutrition Diagnosis:   Inadequate protein-energy intake related to nausea/vomiting/diarrhea as evidenced by poor intake prior to admission    Nutrition Interventions:   Food and/or Nutrient Delivery: Continue Current Tube Feeding, Modify Current Diet  Nutrition Education/Counseling: Education/Counseling initiated  Coordination of Nutrition Care: Continue to monitor while inpatient       Goals:  Previous Goal Met: No Progress toward Goal(s)  Goals: Initiate nutrition support, by  next RD assessment       Nutrition Monitoring and Evaluation:   Behavioral-Environmental Outcomes: None Identified  Food/Nutrient Intake Outcomes: Enteral Nutrition Intake/Tolerance, Parenteral Nutrition Intake/Tolerance  Physical Signs/Symptoms Outcomes: Biochemical Data, Nausea or Vomiting, Weight    Discharge Planning:    Too soon to determine     Melinda Ranger, MS, RD  Available via [employer]    "

## 2024-08-20 NOTE — Progress Notes (Signed)
"  Pt lost IV access on prior shift. Informed by prior shift RN that pt has difficulty maintaining access including ultrasound guided IVs. Contacted Nephrology on call Rexene Castor for approval of PICC or midline. Castor stated no PICC or midline, and only place a central line if absolutely necessary per Jethava MD.   "

## 2024-08-20 NOTE — Dialysis (Signed)
"  Hep B verification performed for (RN): on coming HD nurse  Hep B results, dates, and Primary Source: neg/susceptible EPIC  Hepatitis B Surface Ag   Date/Time Value Ref Range Status   08/07/2024 01:38 AM NONREACTIVE NR   Final     Hep B S Ag Interp   Date/Time Value Ref Range Status   03/16/2023 08:19 AM Negative NEG   Final     Hep B S Ab   Date/Time Value Ref Range Status   08/07/2024 01:38 AM <3.50 mIU/mL Final     Comment:        <10.00 mIU/mL     Non-Immune  =>10.00 mIU/mL    Individual is considered to be immune to infection with HBV       Machine disinfect process:ACID/HEAT  "

## 2024-08-20 NOTE — Progress Notes (Signed)
"  Request for PICC. Patient  with history of ESRD on dialysis with a fistula in the left upper extremity. Per RN, nephrology denied PICC and midline.   "

## 2024-08-20 NOTE — Progress Notes (Signed)
"  I have been taking care of this patient for over 24h in the last two nights. I have literally tried everything to see how we can best control her pain, nausea, absorb her regular meds, and hold down what she is eating and drinking. Administering meds through the tube causes immediate vomiting. She was tolerating I use that term loosely pudding so I tried crushing the pills that I could crush and having her take them with her full liquid. This seems to do better, but she still vomits about an hour or two at the most  later. Last night we started not crushing her meds and still giving them on the full liquid of her choice and this has seemed to hold the best. She is vomiting still atleast every two hours, but the pills are not seeming to come back up from what I can see. Her vomit is usually clear yellow or clear green. Last night she has taken in 3 applesauces, a cup of H2o, 1 gingerale, 2 juices, 2 cups of coffee, and some ice cream. The best guess inputs and outputs have been listed in the flowsheets. Its very difficult to keep track exactly due to I have still caught her vomiting in the bathroom when I am rounding and coming out to the refrigerator. I have educated her regarding the importance of tracking her eating, drinking, vomiting, and urine habits so we can best help her. Her PEG tube seems to be functioning fine from what I can tell. I still cleaned the site and flushed it with 60 of H2o just to test it. I am hopeful that she will attempt resstarting the tube feedings today and flushes once she speaks to the GI doctor and attending. Hoping this note is helpful for next steps. Was also wondering what anyone thinks about scheduling her antiemetics just to ensure that we are timely as staff to do what we can to keep her nausea and vomiting at bay.   "

## 2024-08-20 NOTE — Flowsheet Note (Signed)
 "Primary RN SBAR: Timmie Pica, RN   Incapacitated Nurse edu. provided: yes  Patient Education provided: Procedural / Dietary  Preferred Education method and Primary language: English/verbal  Dialysis consent: yes  Hospital General Consent Verified: yes  Hospital associated wait time; reason: none  Hepatitis B Surface Ag   Date/Time Value Ref Range Status   08/07/2024 01:38 AM NONREACTIVE NR   Final     Hep B S Ag Interp   Date/Time Value Ref Range Status   03/16/2023 08:19 AM Negative NEG   Final     Hep B S Ab   Date/Time Value Ref Range Status   08/07/2024 01:38 AM <3.50 mIU/mL Final     Comment:        <10.00 mIU/mL     Non-Immune  =>10.00 mIU/mL    Individual is considered to be immune to infection with HBV       Hep B results, dates, and Primary Source: Negative/ Susceptible 08/07/2024  Hep B dual verification performed by (RN): Georjean Sharper, RN  Machine disinfect process: Heat       08/20/24 0740   Treatment   Treatment Goal 3H/2-3L   Observations & Evaluations   Level of Consciousness 0   Oriented X x4   Heart Rhythm Regular   Respiratory Quality/Effort Unlabored   O2 Device None (Room air)   Skin Color Ecchymosis (comment)   Skin Condition/Temp Dry;Warm   Appetite Nausea   Abdomen Inspection Soft   Bowel Sounds (All Quadrants) Present   Edema None   Vital Signs   BP (!) 196/69   Temp 97.5 F (36.4 C)   Pulse (!) 110   Respirations 17   SpO2 100 %   Pain Assessment   Pain Assessment 0-10   Pain Level 10   Patient's Stated Pain Goal 0 - No pain   Pain Location Abdomen   Pain Orientation Upper   Pain Descriptors Aching   Functional Pain Assessment Activities are not prevented   Technical Checks   Dialysis Machine No. [id number]   RO Machine Number [id number]   Dialyzer Lot No. [id number]   Tubing Lot Number [id number]   All Connections Secure Yes   NS Bag Yes   Saline Line Double Clamped Yes   Dialyzer F-180   Prime Volume (mL) 250 mL   ICEBOAT I;C;E;B;O;A;T   RO Machine Log Sheet Completed Yes   Machine Alarm  Self Test Completed;Passed   Child Psychotherapist Function;pH Reading   Sport And Exercise Psychologist Conductivity 14.1   Manual Ph 7.2   Bleach Test (Neg) Yes   Bath Temperature 98.6 F (37 C)   Dialysis Bath   K+ (Potassium) 3   Ca+ (Calcium ) 2.5   Na+ (Sodium) 138   HCO3 (Bicarb) 38   Bicarbonate Concentrate Lot No. [id number]   Acid Concentrate Lot No. [id number]   Handoff   Communication Given   (PRe HD)   Handoff Given To Andrea Maas, RN   Handoff Received From Timmie Pica, RN   Handoff Communication Telephone     Each access site disinfected for 60 seconds per site with alcohol swabs per P&P. Cannulated with 15G needles x2 and secured with paper tape.   +aspiration/+flushed.     Patient's consent, code status, medications checked.                      Safety checks complete, time out performed.   assessed, +bruit/thrill,  no redness, warmth or drainage noted. Skin prepped per procedure using chloraprep scrub x 30 sec, followed by 30 sec dry time each site. Cannulated using 15g needles each site and secured with tape. +flash/aspiration/flush. HD initiated. Access visible, lines secure. Medications reviewed.        08/20/24 0747   During Hemodialysis Assessment   BP (!) 209/105   Pulse (!) 107   Blood Flow Rate (ml/min) 350 ml/min   Arterial Pressure (mmHg) -170 mmHg   Venous Pressure (mmHg) 190   TMP 10   DFR 500   Comments   (HD initiated)   Access Visible Yes   Ultrafiltration Rate (ml/hr) 1000 ml/hr   Ultrafiltration Removed (ml) 0 ml      08/20/24 1030   During Hemodialysis Assessment   BP 98/72   Pulse (!) 111   Blood Flow Rate (ml/min) 400 ml/min   Arterial Pressure (mmHg) -190 mmHg   Venous Pressure (mmHg) 200   TMP 10   DFR 600   Comments   (bp soft low/ UF off)   Access Visible Yes   Ultrafiltration Rate (ml/hr) 1000 ml/hr   Ultrafiltration Removed (ml) 2792 ml      08/20/24 1039   During Hemodialysis Assessment   BP 108/77   Pulse (!) 109   Comments    (rechecked bp, improving UF back on)        08/20/24 1045   During Hemodialysis Assessment   BP 109/78   Pulse (!) 110   Blood Flow Rate (ml/min) 400 ml/min   Arterial Pressure (mmHg) -190 mmHg   Venous Pressure (mmHg) 190   TMP 10   DFR 600   Comments   (HD completed)   Access Visible Yes   Ultrafiltration Rate (ml/hr) 1000 ml/hr   Ultrafiltration Removed (ml) 2856 ml     POST HD     08/20/24 1056   Treatment   Time On 0747   Time Off 1045   Observations & Evaluations   Level of Consciousness 0   Oriented X x4   Heart Rhythm Regular   Respiratory Quality/Effort Unlabored   O2 Device None (Room air)   Skin Color Ecchymosis (comment)   Skin Condition/Temp Dry;Warm   Appetite Nausea   Abdomen Inspection Soft   Bowel Sounds (All Quadrants) Present   Vital Signs   BP (!) 152/93   Temp 97.6 F (36.4 C)   Pulse (!) 106   Respirations 17   SpO2 98 %   Hemodialysis Fistula/Graft Arteriovenous fistula Left Arm   No placement date or time found.   Present on Admission/Arrival: Yes  Access Type: Arteriovenous fistula  Orientation: Left  Access Location: Arm   Site Assessment Clean, dry & intact   Thrill Present   Bruit Present   Status Deaccessed   Post-Hemodialysis Assessment   Post-Treatment Procedures Blood returned;Access bleeding time < 10 minutes   Machine Disinfection Process Acid/Vinegar Clean;Heat Disinfect;Exterior Engineer, Agricultural Volume (ml) 250 ml   Blood Volume Processed (Liters) 65.3 L   Dialyzer Clearance Moderately streaked   Duration of Treatment (minutes) 180 minutes   Hemodialysis Intake (ml) 500 ml   Hemodialysis Output (ml) 2856 ml   NET Removed (ml) 2356   Tolerated Treatment Good   Patient Response to Treatment TOLERATED WELL   Physician Notified No   Patient Disposition Return to room   Handoff   Communication Given   (post hd)   Handoff Given To Mirpuristokes, Seabron, CHARITY FUNDRAISER  Handoff Received From Ladell Hollering, RN   Handoff Communication Telephone     HD treatment complete. All  possible blood returned to the patient. De-cannulated and pressure held until hemostasis was achieved. No bleeding noted.  Dressing applied. +bruit/thrill.   Patient tolerated treatment without incident, UF goal achieved. At end of treatment patient is awake and alert, VSS. Needle limbs cleaned, all possible blood rinsed back to patient, limbs flushed with 10cc NS. Needles removed, manual pressure applied over puncture sites with clean dry folded 2x2 for approximately five minutes, hemostasis achieved. Puncture sites covered with clean dry folded 2x2, secured with tape. Bed wheels locked, bed in low position, call light in reach.   Comfort and care rendered, needs attended, questions answered.  Returned back to room via transport, awake stable  Report given to primary RN thru phone        "

## 2024-08-20 NOTE — Plan of Care (Signed)
"    Problem: Chronic Conditions and Co-morbidities  Goal: Patient's chronic conditions and co-morbidity symptoms are monitored and maintained or improved  Outcome: Progressing     Problem: Discharge Planning  Goal: Discharge to home or other facility with appropriate resources  Outcome: Progressing     Problem: Pain  Goal: Verbalizes/displays adequate comfort level or baseline comfort level  Outcome: Progressing     Problem: Safety - Adult  Goal: Free from fall injury  Outcome: Progressing     Problem: Nutrition Deficit:  Goal: Optimize nutritional status  Outcome: Progressing     Problem: Gastrointestinal - Adult  Goal: Minimal or absence of nausea and vomiting  Outcome: Progressing  Goal: Maintains or returns to baseline bowel function  Outcome: Progressing  Goal: Maintains adequate nutritional intake  Outcome: Progressing     Problem: Skin/Tissue Integrity  Goal: Skin integrity remains intact  Description: 1.  Monitor for areas of redness and/or skin breakdown  2.  Assess vascular access sites hourly  3.  Every 4-6 hours minimum:  Change oxygen saturation probe site  4.  Every 4-6 hours:  If on nasal continuous positive airway pressure, respiratory therapy assess nares and determine need for appliance change or resting period  Outcome: Progressing     "

## 2024-08-21 LAB — CBC WITH AUTO DIFFERENTIAL
Basophils %: 0.5 % (ref 0.0–1.0)
Basophils Absolute: 0.04 K/UL (ref 0.00–0.10)
Eosinophils %: 1.4 % (ref 0.0–7.0)
Eosinophils Absolute: 0.12 K/UL (ref 0.00–0.40)
Hematocrit: 39.3 % (ref 35.0–47.0)
Hemoglobin: 12.7 g/dL (ref 11.5–16.0)
Immature Granulocytes %: 0.3 % (ref 0.0–0.5)
Immature Granulocytes Absolute: 0.03 K/UL (ref 0.00–0.04)
Lymphocytes %: 11.9 % — ABNORMAL LOW (ref 12.0–49.0)
Lymphocytes Absolute: 1.05 K/UL (ref 0.80–3.50)
MCH: 30.9 pg (ref 26.0–34.0)
MCHC: 32.3 g/dL (ref 30.0–36.5)
MCV: 95.6 FL (ref 80.0–99.0)
MPV: 9.4 FL (ref 8.9–12.9)
Monocytes %: 7.2 % (ref 5.0–13.0)
Monocytes Absolute: 0.64 K/UL (ref 0.00–1.00)
Neutrophils %: 78.7 % — ABNORMAL HIGH (ref 32.0–75.0)
Neutrophils Absolute: 6.96 K/UL (ref 1.80–8.00)
Nucleated RBCs: 0 /100{WBCs}
Platelets: 481 K/uL — ABNORMAL HIGH (ref 150–400)
RBC: 4.11 M/uL (ref 3.80–5.20)
RDW: 17.2 % — ABNORMAL HIGH (ref 11.5–14.5)
WBC: 8.8 K/uL (ref 3.6–11.0)
nRBC: 0 K/uL (ref 0.00–0.01)

## 2024-08-21 LAB — COMPREHENSIVE METABOLIC PANEL
ALT: <5 U/L — ABNORMAL LOW (ref 10–35)
AST: 37 U/L — ABNORMAL HIGH (ref 10–35)
Albumin/Globulin Ratio: 1 — ABNORMAL LOW (ref 1.1–2.2)
Albumin: 3.1 g/dL — ABNORMAL LOW (ref 3.5–5.2)
Alk Phosphatase: 155 U/L — ABNORMAL HIGH (ref 35–104)
Anion Gap: 11 mmol/L (ref 2–14)
BUN/Creatinine Ratio: 4 — ABNORMAL LOW (ref 12–20)
BUN: 19 mg/dL (ref 6–20)
CO2: 31 mmol/L — ABNORMAL HIGH (ref 20–29)
Calcium: 8.6 mg/dL (ref 8.6–10.0)
Chloride: 95 mmol/L — ABNORMAL LOW (ref 98–107)
Creatinine: 4.44 mg/dL — ABNORMAL HIGH (ref 0.60–1.00)
Est, Glom Filt Rate: 12 ml/min/1.73m2 — ABNORMAL LOW (ref >59)
Globulin: 3.2 g/dL (ref 2.0–4.0)
Glucose: 263 mg/dL — ABNORMAL HIGH (ref 65–100)
Potassium: 4 mmol/L (ref 3.5–5.1)
Sodium: 137 mmol/L (ref 136–145)
Total Bilirubin: 0.3 mg/dL (ref 0.0–1.2)
Total Protein: 6.3 g/dL — ABNORMAL LOW (ref 6.4–8.3)

## 2024-08-21 LAB — MAGNESIUM: Magnesium: 2.1 mg/dL (ref 1.6–2.6)

## 2024-08-21 LAB — PHOSPHORUS: Phosphorus: 3.1 mg/dL (ref 2.5–4.5)

## 2024-08-21 LAB — POCT GLUCOSE
POC Glucose: 175 mg/dL — ABNORMAL HIGH (ref 65–117)
POC Glucose: 194 mg/dL — ABNORMAL HIGH (ref 65–117)
POC Glucose: 229 mg/dL — ABNORMAL HIGH (ref 65–117)

## 2024-08-21 MED ORDER — LIDOCAINE HCL (PF) 1 % IJ SOLN
1 | Freq: Once | INTRAMUSCULAR | Status: DC
Start: 2024-08-21 — End: 2024-08-28

## 2024-08-21 MED ORDER — NORMAL SALINE FLUSH 0.9 % IV SOLN
0.9 | Freq: Two times a day (BID) | INTRAVENOUS | Status: DC
Start: 2024-08-21 — End: 2024-08-28
  Administered 2024-08-25 – 2024-08-26 (×4): 10 mL via INTRAVENOUS
  Administered 2024-08-27: 01:00:00 5 mL via INTRAVENOUS
  Administered 2024-08-27 – 2024-08-28 (×2): 10 mL via INTRAVENOUS
  Administered 2024-08-28: 02:00:00 5 mL via INTRAVENOUS

## 2024-08-21 MED ORDER — ONDANSETRON HCL 4 MG/2ML IJ SOLN
4 | Freq: Four times a day (QID) | INTRAMUSCULAR | Status: DC | PRN
Start: 2024-08-21 — End: 2024-08-21

## 2024-08-21 MED ORDER — ONDANSETRON HCL 4 MG/2ML IJ SOLN
4 | Freq: Four times a day (QID) | INTRAMUSCULAR | Status: DC
Start: 2024-08-21 — End: 2024-08-25
  Administered 2024-08-21 – 2024-08-24 (×14): 4 mg via INTRAMUSCULAR

## 2024-08-21 MED ORDER — HYDROMORPHONE HCL PF 1 MG/ML IJ SOLN
1 | Freq: Once | INTRAMUSCULAR | Status: AC
Start: 2024-08-21 — End: 2024-08-21
  Administered 2024-08-21: 10:00:00 0.5 mg via INTRAMUSCULAR

## 2024-08-21 MED ORDER — HYDROMORPHONE HCL PF 1 MG/ML IJ SOLN
1 | INTRAMUSCULAR | Status: DC | PRN
Start: 2024-08-21 — End: 2024-08-24
  Administered 2024-08-21 – 2024-08-23 (×8): 0.5 mg via INTRAMUSCULAR

## 2024-08-21 MED ORDER — PROMETHAZINE HCL 25 MG RE SUPP
25 | Freq: Four times a day (QID) | RECTAL | Status: DC | PRN
Start: 2024-08-21 — End: 2024-08-21

## 2024-08-21 MED ORDER — HYDROMORPHONE HCL PF 1 MG/ML IJ SOLN
1 | Freq: Once | INTRAMUSCULAR | Status: AC
Start: 2024-08-21 — End: 2024-08-21
  Administered 2024-08-21: 16:00:00 0.5 mg via INTRAMUSCULAR

## 2024-08-21 MED ORDER — NORMAL SALINE FLUSH 0.9 % IV SOLN
0.9 | INTRAVENOUS | Status: DC | PRN
Start: 2024-08-21 — End: 2024-08-28

## 2024-08-21 MED ORDER — SODIUM CHLORIDE 0.9 % IV SOLN
0.9 | INTRAVENOUS | Status: DC | PRN
Start: 2024-08-21 — End: 2024-08-26

## 2024-08-21 MED ORDER — PROCHLORPERAZINE 25 MG RE SUPP
25 | Freq: Two times a day (BID) | RECTAL | Status: DC | PRN
Start: 2024-08-21 — End: 2024-08-28
  Administered 2024-08-23 (×2): 25 mg via RECTAL

## 2024-08-21 MED ORDER — LANSOPRAZOLE 15 MG PO TBDD
15 | Freq: Two times a day (BID) | ORAL | Status: DC
Start: 2024-08-21 — End: 2024-08-28
  Administered 2024-08-21 – 2024-08-27 (×10): 30 mg via GASTROSTOMY

## 2024-08-21 MED ORDER — METOCLOPRAMIDE HCL 5 MG/5ML PO SOLN
5 | Freq: Three times a day (TID) | ORAL | Status: DC
Start: 2024-08-21 — End: 2024-08-28
  Administered 2024-08-21 – 2024-08-27 (×12): 5 mg via GASTROSTOMY

## 2024-08-21 MED FILL — HYDRALAZINE HCL 25 MG PO TABS: 25 mg | ORAL | Qty: 4

## 2024-08-21 MED FILL — PROCHLORPERAZINE 25 MG RE SUPP: 25 mg | RECTAL | Qty: 1

## 2024-08-21 MED FILL — HEPARIN SODIUM (PORCINE) 5000 UNIT/ML IJ SOLN: 5000 [IU]/mL | INTRAMUSCULAR | Qty: 1

## 2024-08-21 MED FILL — ONDANSETRON HCL 4 MG/2ML IJ SOLN: 4 MG/2ML | INTRAMUSCULAR | Qty: 2

## 2024-08-21 MED FILL — VITAMIN B-6 50 MG PO TABS: 50 mg | ORAL | Qty: 1

## 2024-08-21 MED FILL — BD POSIFLUSH 0.9 % IV SOLN: 0.9 % | INTRAVENOUS | Qty: 40 | Fill #0

## 2024-08-21 MED FILL — HYDROMORPHONE HCL 1 MG/ML IJ SOLN: 1 mg/mL | INTRAMUSCULAR | Qty: 1

## 2024-08-21 MED FILL — HYDRALAZINE HCL 25 MG PO TABS: 25 mg | ORAL | Qty: 4 | Fill #0

## 2024-08-21 MED FILL — SEVELAMER CARBONATE 0.8 G PO PACK: 0.8 g | ORAL | Qty: 1

## 2024-08-21 MED FILL — INSULIN LISPRO 100 UNIT/ML IJ SOLN: 100 [IU]/mL | INTRAMUSCULAR | Qty: 2

## 2024-08-21 MED FILL — OXYCODONE HCL 10 MG/0.5ML PO CONC: 10 MG/0.5ML | ORAL | Qty: 0.5

## 2024-08-21 MED FILL — OXYCODONE HCL 10 MG/0.5ML PO CONC: 10 MG/0.5ML | ORAL | Qty: 0.5 | Fill #0

## 2024-08-21 MED FILL — METOCLOPRAMIDE HCL 10 MG/10ML PO SOLN: 10 mg/mL | ORAL | Qty: 10

## 2024-08-21 MED FILL — ASPIRIN 81 MG PO CHEW: 81 mg | ORAL | Qty: 1

## 2024-08-21 MED FILL — BUSPIRONE HCL 10 MG PO TABS: 10 mg | ORAL | Qty: 1 | Fill #0

## 2024-08-21 MED FILL — SENNOSIDES-DOCUSATE SODIUM 8.6-50 MG PO TABS: 8.6-50 mg | ORAL | Qty: 1

## 2024-08-21 MED FILL — FAMOTIDINE 20 MG PO TABS: 20 mg | ORAL | Qty: 1

## 2024-08-21 MED FILL — SODIUM BICARBONATE 650 MG PO TABS: 650 mg | ORAL | Qty: 1

## 2024-08-21 MED FILL — ATORVASTATIN CALCIUM 20 MG PO TABS: 20 mg | ORAL | Qty: 4 | Fill #0

## 2024-08-21 MED FILL — VITAMIN B-6 50 MG PO TABS: 50 mg | ORAL | Qty: 1 | Fill #0

## 2024-08-21 MED FILL — PROMETHEGAN 25 MG RE SUPP: 25 mg | RECTAL | Qty: 1

## 2024-08-21 MED FILL — CARVEDILOL 12.5 MG PO TABS: 12.5 mg | ORAL | Qty: 2

## 2024-08-21 MED FILL — DULOXETINE HCL 30 MG PO CPEP: 30 mg | ORAL | Qty: 2

## 2024-08-21 MED FILL — BUSPIRONE HCL 10 MG PO TABS: 10 mg | ORAL | Qty: 1

## 2024-08-21 MED FILL — ACETAMINOPHEN EXTRA STRENGTH 500 MG PO TABS: 500 mg | ORAL | Qty: 2 | Fill #0

## 2024-08-21 MED FILL — CLOPIDOGREL BISULFATE 75 MG PO TABS: 75 mg | ORAL | Qty: 1

## 2024-08-21 MED FILL — HEPARIN SODIUM (PORCINE) 5000 UNIT/ML IJ SOLN: 5000 [IU]/mL | INTRAMUSCULAR | Qty: 1 | Fill #0

## 2024-08-21 MED FILL — LANSOPRAZOLE 15 MG PO TBDD: 15 mg | ORAL | Qty: 2

## 2024-08-21 MED FILL — NIFEDIPINE ER OSMOTIC RELEASE 30 MG PO TB24: 30 mg | ORAL | Qty: 3

## 2024-08-21 MED FILL — SENNOSIDES-DOCUSATE SODIUM 8.6-50 MG PO TABS: 8.6-50 mg | ORAL | Qty: 1 | Fill #0

## 2024-08-21 MED FILL — ACETAMINOPHEN EXTRA STRENGTH 500 MG PO TABS: 500 mg | ORAL | Qty: 2

## 2024-08-21 MED FILL — DICYCLOMINE HCL 10 MG PO CAPS: 10 mg | ORAL | Qty: 1

## 2024-08-21 MED FILL — SODIUM BICARBONATE 650 MG PO TABS: 650 mg | ORAL | Qty: 1 | Fill #0

## 2024-08-21 NOTE — Progress Notes (Signed)
"  Spiritual Health Attempted Visit Note        Room # 426/01    Name: Melinda [name]           Age: 43 y.o.    Gender: female          MRN: [id number]  Religion: Catholic       Preferred Language: Spanish      Date: 08/21/24  Visit Time: Begin Time: 1130 End Time : 1135 Complexity of Encounter: Low      Visit Summary: Chaplain attempted to meet with patient in response to Catholic rounds. Patient was asleep.  Spiritual communion prayer offered outside her room. Chaplain available for follow-up as needed.      Patient was not available. Patient was working with other staff/was asleep/ indisposed. Chaplain will follow-up at a later time.        Electronically signed by Elia Karolee Buel Charlott, SBS, RN, ACSW, LCSW  Chaplain Page:  [phone])  "

## 2024-08-21 NOTE — Care Coordination (Addendum)
"  Care Management Progress Note        Reason for Admission:   Hypertensive emergency [I16.1]  Intractable nausea and vomiting [R11.2]         Patient Admission Status: Inpatient  RUR: 39%  Hospitalization in the last 30 days (Readmission):  No        Transition of care plan:  Patient was discussed in IDR and continues to be medically managed.    Dispo: return home with family.     HD: CM has called [facility] [phone] today 11/11 and left a voicemail for Arlene notifying her that the patient will not be at the HD center tomorrow Wednesday 11/12.     Discharge plan communicated with patient and/or discharge caregiver: Yes      Date 1st IMM letter given: N/a. Patient has a comptroller.     Outpatient follow-up.    Transport at discharge: family.        ___________________________________________   Joan Pond, RN Case Manager  08/21/2024   3:15 PM       "

## 2024-08-21 NOTE — Plan of Care (Signed)
"    Problem: Chronic Conditions and Co-morbidities  Goal: Patient's chronic conditions and co-morbidity symptoms are monitored and maintained or improved  08/21/2024 1730 by Mirpuristokes, Seabron, RN  Outcome: Progressing  08/21/2024 1730 by Mirpuristokes, Seabron, RN  Outcome: Progressing     Problem: Discharge Planning  Goal: Discharge to home or other facility with appropriate resources  Outcome: Progressing     Problem: Pain  Goal: Verbalizes/displays adequate comfort level or baseline comfort level  Outcome: Progressing     Problem: Safety - Adult  Goal: Free from fall injury  Outcome: Progressing     Problem: Nutrition Deficit:  Goal: Optimize nutritional status  Outcome: Progressing     Problem: Gastrointestinal - Adult  Goal: Minimal or absence of nausea and vomiting  Outcome: Progressing  Goal: Maintains or returns to baseline bowel function  Outcome: Progressing  Goal: Maintains adequate nutritional intake  Outcome: Progressing     Problem: Skin/Tissue Integrity  Goal: Skin integrity remains intact  Description: 1.  Monitor for areas of redness and/or skin breakdown  2.  Assess vascular access sites hourly  3.  Every 4-6 hours minimum:  Change oxygen saturation probe site  4.  Every 4-6 hours:  If on nasal continuous positive airway pressure, respiratory therapy assess nares and determine need for appliance change or resting period  Outcome: Progressing     "

## 2024-08-21 NOTE — Progress Notes (Signed)
 "        [facility]                                                                                          Hospitalist Progress Note  Altamese Irani, DO  Office Phone: [phone]        Date of Service:  08/21/2024  NAME:  Melinda [name]  DOB:  1980-12-04  MRN:  [id number]     HPI:     Follow-up gastroparesis.  Was on scheduled her Compazine  and Zofran .  However, no IV access this morning and patient was with nausea and vomiting; also reporting vomiting.  She was finally agreeing to attempt J-tube feeds again.  Attempting to reestablish IV access; likely hohn per renal     Assessment & Plan:     43 year old female with ESRD, type 2 diabetes, hypertension, admitted with acute on chronic vomiting and abdominal pain likely secondary to gastroparesis.    Gastroparesis with emesis abdominal pain, status post GJ tube  - Per GI note, patient has intractable vomiting that is difficult to treat, but should continue to use her GJ tube  - Holding off on catheter for long-term TPN  - Make every effort to maintain nutrition through tube feeds  - Eating the above foods including pudding, applesauce, ice cream, and a full apple twice this weekend  - PPI and Pepcid   - Was on scheduled Zofran  and Reglan  however holding due to lack of IV access and suppository orders in place  - Continue scopolamine  patch  - bowel reg  - Resume tube feeds as able    Sepsis secondary to MSSA bacteremia  - Per ID, cefazolin  2 g on HD days through 09/19/2024    Prolonged QT  - Monitor EKG every other day on antiemetics    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  POA  - Phosphorus trended down;  - Continue sevelamer       Fentanyl  positive: Seen on drug screen.  - May be a false positive in the setting of Benadryl  use.  - Patient adamantly denies use.     Hypertension POA  Elevated into 180s today  - Continue home Coreg  and hydralazine   - P.o. meds as tolerated  -Monitor BPs     ESRD  On  HD  Nephrology following     GERD: Chronic.    Continue Pepcid  and pantop     T2DM with gastroparesis. POA, chronic.  Last A1c of 6.1 on 9/17.    -Sliding scale insulin .      Chronic pain or anxiety/depression  -Duloxetine  and BuSpar .     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 Mesenteric MRA 01/22/24 with proximally and distally patent SMA and diffuse anasarca.   - will continue plavix  and ASA     Anemia: Trend.  Follow-up outpatient       Code status:   Full Code  DVT Prophylaxis: heparin   Anticipated Disposition: home once symptoms and BP improved  Review of Systems:   Pertinent symptoms noted in the HPI    Vital Signs:    Last 24hrs VS reviewed since prior progress note. Most recent are:  Vitals:    08/21/24 1137   BP:    Pulse:    Resp: 16   Temp:    SpO2:        Physical Examination:     I had a face to face encounter with this patient and independently examined them on 08/21/2024 as outlined below:        General : With nausea and vomiting  HEENT: moist mucus membranes  Chest: CTAB, normal WOB  CVS: S1 and S2 heard, no m/g/r  Abd: non distended. Ttp   Ext: WWP, no edema  Neuro/Psych: no focal deficits  Skin: no rash      Labs and imaging:     Recent Labs     08/20/24  0519 08/21/24  0335   WBC 8.0 8.8   HGB 12.5 12.7   HCT 40.0 39.3   PLT 508* 481*     Recent Labs     08/19/24  0458 08/20/24  0519 08/21/24  0335   NA 136 141 137   K 3.9 4.1 4.0   CL 97* 98 95*   CO2 29 29 31*   BUN 18 23* 19   MG  --   --  2.1   PHOS  --   --  3.1     Recent Labs     08/19/24  0458 08/20/24  0519 08/21/24  0335   ALT <5* <5* <5*   GLOB 3.2 3.9 3.2     No results for input(s): INR, APTT in the last 72 hours.    Invalid input(s): PTP   No results for input(s): TIBC in the last 72 hours.    Invalid input(s): FE, PSAT, FERR   No results found for: RBCF   No results for input(s): PH, PCO2, PO2 in the last 72 hours.  No results for  input(s): CPK in the last 72 hours.    Invalid input(s): CPKMB, CKNDX, TROIQ  Lab Results   Component Value Date/Time    CHOL 192 08/07/2024 11:19 AM    HDL 80 08/07/2024 11:19 AM    LDL 78 08/07/2024 11:19 AM    LDL Not calculated due to elevated triglyceride level 09/16/2022 10:30 AM     No results found for: GLUCPOC  @LABUA @      US  EXTREMITY LEFT NON VASC LIMITED   Final Result   An ill-defined, heterogeneously hypoechoic mass anterior to the AV fistula graft   measures 6 x 12 x 16 mm. Internal blood flow cannot be determined due to   adjacent arterial flow causing artifact. Phlegmon or old hematoma are possible.   It is not a circumscribed, walled off abscess. The patient reports that this has   been palpable for 2 years, favoring old hematoma.         Electronically signed by Medford Manners      MRI LUMBAR SPINE W WO CONTRAST   Final Result      Degenerative changes are conspicuous at the cervical spine.      Moderate to severe canal stenosis at C4-5 and moderate canal stenosis at C3-C4.      No other canal or foraminal compromise is identified.      There is no fracture or dislocation.      There is no cord signal abnormality  or abnormal intrathecal enhancement. No   evidence of discitis/osteomyelitis.                     Electronically signed by JIMMY RABON      MRI THORACIC SPINE W WO CONTRAST   Final Result      Degenerative changes are conspicuous at the cervical spine.      Moderate to severe canal stenosis at C4-5 and moderate canal stenosis at C3-C4.      No other canal or foraminal compromise is identified.      There is no fracture or dislocation.      There is no cord signal abnormality or abnormal intrathecal enhancement. No   evidence of discitis/osteomyelitis.                     Electronically signed by JIMMY RABON      MRI CERVICAL SPINE W WO CONTRAST   Final Result      Degenerative changes are conspicuous at the cervical spine.      Moderate to severe canal stenosis at C4-5 and  moderate canal stenosis at C3-C4.      No other canal or foraminal compromise is identified.      There is no fracture or dislocation.      There is no cord signal abnormality or abnormal intrathecal enhancement. No   evidence of discitis/osteomyelitis.                     Electronically signed by JIMMY HABIB      CT Upper Extremity Left W Contrast   Final Result   Nonspecific 2 cm area of subcutaneous attenuation anterior to the   distal graft anastomosis. A defined collection is not shown. Further evaluation   with ultrasound at the distal anastomotic site of the graft is recommended to   exclude small abscess.         Electronically signed by Alm Edman, MD      XR CHEST PORTABLE   Final Result   No acute cardiopulmonary process.      Electronically signed by Harden Cahill      CT ABDOMEN PELVIS WO CONTRAST Additional Contrast? None   Final Result   No acute intraperitoneal process is identified.   Gastrojejunostomy.   Colonic diverticulosis with no diverticulitis.   Incidental and/or nonemergent findings are as described in detail above.         Electronically signed by JIMMY HABIB          08/06/24    ECHO (TTE) COMPLETE (PRN CONTRAST/BUBBLE/STRAIN/3D) 08/12/2024  1:21 PM (Final)    Interpretation Summary    Left Ventricle: Low normal left ventricular systolic function. EF by visual approximation is 55%. Left ventricle size is normal. Findings consistent with moderate concentric hypertrophy. Normal wall motion. Diastolic dysfunction present with normal LV EF.    Right Ventricle: Right ventricle size is normal. Normal systolic function.    Aortic Valve: Mildly calcified cusps. Mild sclerosis of the aortic valve cusps.    Mitral Valve: There is mild annular calcification noted.    Image quality is good.    Signed by: Vikas K Rathi, MD on 08/12/2024  1:21 PM        Level of Service Documentation     In caring for this patient today, I reviewed recent notes, interpreted lab results and imaging tests, had a  face-to-face encounter with the patient, performing the medically necessary appropriate  exam and history, counseled the patient/family/caregiver, ordered medications, tests and procedures as indicated for diagnostic and therapeutic purposes, consulted and communicated with other healthcare professionals including care coordination and discharge planning, and documented clinical information in the electronic health record.  ______________________________________________________________________                 Altamese Irani, DO     "

## 2024-08-21 NOTE — Progress Notes (Signed)
 "                            Melinda [name]  Date of Birth: December 11, 1980          Assessment & Plan:     ESRD on HD MWF via [facility], [facility], f/b Dr. Kalvin  Hypertensive urgency-better after HD  DM  AOCD  Chronic abd pain, n/v from gastroparesis-reason for admit  Staph bacteremia cxs now negative     Plan:  HD MWF  ESA MWF  Needs cefazolin  2/2/3 post HD until 09/19/24-spoke with her clinic and they are aware  If she needs IV access-please place Hohn. We need to preserve her arm veins for dialysis       Subjective:   CC: ESRD  HPI: Writhing in pain today-back and abd    Current Facility-Administered Medications   Medication Dose Route Frequency    promethazine  (PHENERGAN ) suppository 25 mg  25 mg Rectal Q6H PRN    ondansetron  (ZOFRAN ) injection 4 mg  4 mg IntraMUSCular Q6H    [Held by provider] prochlorperazine  (COMPAZINE ) injection 10 mg  10 mg IntraVENous Q6H    [Held by provider] ondansetron  (ZOFRAN ) injection 4 mg  4 mg IntraVENous Q6H    sodium chloride  flush 0.9 % injection 5-40 mL  5-40 mL IntraVENous 2 times per day    sodium chloride  flush 0.9 % injection 5-40 mL  5-40 mL IntraVENous PRN    0.9 % sodium chloride  infusion   IntraVENous PRN    lidocaine  PF 1 % injection 50 mg  50 mg IntraDERmal Once    dextrose  bolus 10% 125 mL  125 mL IntraVENous PRN    Or    dextrose  bolus 10% 250 mL  250 mL IntraVENous PRN    glucagon  injection 1 mg  1 mg SubCUTAneous PRN    dextrose  10 % infusion   IntraVENous Continuous PRN    hydrALAZINE  (APRESOLINE ) tablet 100 mg  100 mg Per G Tube TID    glucose chewable tablet 16 g  4 tablet Per G Tube PRN    [Held by provider] HYDROmorphone  HCl PF (DILAUDID ) injection 0.5 mg  0.5 mg IntraVENous Q4H PRN    Or    [Held by provider] HYDROmorphone  HCl PF (DILAUDID ) injection 1 mg  1 mg IntraVENous Q4H PRN    labetalol  (NORMODYNE ;TRANDATE ) injection 10 mg  10 mg IntraVENous Q4H PRN    acetaminophen  (TYLENOL ) tablet 1,000 mg  1,000 mg PEG  Tube 4 times per day    dicyclomine  (BENTYL ) capsule 10 mg  10 mg PEG Tube TID AC    polyethylene glycol (GLYCOLAX ) packet 17 g  17 g Per G Tube Daily PRN    sevelamer  (RENVELA ) packet 0.8 g  0.8 g Per G Tube TID WC    sodium bicarbonate  tablet 650 mg  650 mg PEG Tube TID    oxyCODONE  (ROXICODONE  INTENSOL) 100 MG/5ML concentrated solution 5 mg  5 mg Per G Tube Q4H PRN    pantoprazole  (PROTONIX ) 40 mg in sodium chloride  (PF) 0.9 % 10 mL injection  40 mg IntraVENous Q12H    [Held by provider] metoclopramide  (REGLAN ) injection 5 mg  5 mg IntraVENous TID AC    bisacodyl  (DULCOLAX) suppository 10 mg  10 mg Rectal Daily PRN    NIFEdipine  (PROCARDIA  XL) extended release tablet 90 mg  90 mg Oral Daily    [  Held by provider] epoetin  alfa-epbx (RETACRIT ) injection 10,000 Units  10,000 Units SubCUTAneous Q MWF    aspirin  chewable tablet 81 mg  81 mg Per G Tube Daily    atorvastatin  (LIPITOR ) tablet 80 mg  80 mg Per G Tube QHS    busPIRone  (BUSPAR ) tablet 5 mg  5 mg Per G Tube BID    clopidogrel  (PLAVIX ) tablet 75 mg  75 mg Per G Tube Daily    DULoxetine  (CYMBALTA ) extended release capsule 60 mg  60 mg Per G Tube Daily    famotidine  (PEPCID ) tablet 20 mg  20 mg Per G Tube Daily    sennosides-docusate sodium  (SENOKOT-S) 8.6-50 MG tablet 1 tablet  1 tablet Per G Tube BID    vitamin B-6 (PYRIDOXINE ) tablet 25 mg  25 mg Per G Tube Q6H    ceFAZolin  (ANCEF ) 1,000 mg in sterile water  10 mL IV syringe  1,000 mg IntraVENous Daily    sodium chloride  flush 0.9 % injection 5-40 mL  5-40 mL IntraVENous 2 times per day    sodium chloride  flush 0.9 % injection 5-40 mL  5-40 mL IntraVENous PRN    0.9 % sodium chloride  infusion   IntraVENous PRN    carvedilol  (COREG ) tablet 25 mg  25 mg Per G Tube BID WC    scopolamine  (TRANSDERM-SCOP) transdermal patch 1 patch  1 patch TransDERmal Q72H    albumin  human 25% IV solution 25 g  25 g IntraVENous PRN    0.9 % sodium chloride  infusion   IntraVENous PRN    heparin  (porcine) injection 5,000 Units  5,000  Units SubCUTAneous 3 times per day    insulin  lispro (HUMALOG ,ADMELOG ) injection vial 0-8 Units  0-8 Units SubCUTAneous 4x Daily AC & HS          Objective:     Vitals:  Blood pressure (!) 175/90, pulse 93, temperature 97.7 F (36.5 C), temperature source Axillary, resp. rate 16, height 1.499 m (4' 11.02), weight 54.6 kg (120 lb 5.9 oz), SpO2 98%.  Temp (24hrs), Avg:98.2 F (36.8 C), Min:97.7 F (36.5 C), Max:98.6 F (37 C)      Intake and Output:  No intake/output data recorded.  11/09 1901 - 11/11 0700  In: 2883 [P.O.:1500]  Out: 4006 [Urine:350]    Physical Exam:               GENERAL ASSESSMENT: writhing in pain  HEENT: Nontraumatic   EXTREMITY: no EDEMA; LUE AVG NT, no swelling/warmth, good t/b  NEURO: Grossly non focal          ECG/rhythm:    Data Review        Recent Labs     08/19/24  0458 08/20/24  0519 08/21/24  0335   NA 136 141 137   K 3.9 4.1 4.0   CL 97* 98 95*   CO2 29 29 31*   BUN 18 23* 19   CREATININE 4.74* 5.89* 4.44*   GLUCOSE 191* 151* 263*   CALCIUM  8.5* 9.2 8.6       : Melinda [name]  08/21/2024        [facility]:  [email or web address]  [email or web address]    [place] office:  [address] [place], [address]  [place], [place] [address]  Phone: [phone]  Fax :     [phone]    [facility] office:  [address]  [place], [place]  [address]  Phone - [phone]  Fax - [phone]                                                                                        "

## 2024-08-22 ENCOUNTER — Inpatient Hospital Stay: Admit: 2024-08-22 | Payer: PRIVATE HEALTH INSURANCE

## 2024-08-22 LAB — COMPREHENSIVE METABOLIC PANEL
ALT: <5 U/L — ABNORMAL LOW (ref 10–35)
AST: 39 U/L — ABNORMAL HIGH (ref 10–35)
Albumin/Globulin Ratio: 1 — ABNORMAL LOW (ref 1.1–2.2)
Albumin: 3.1 g/dL — ABNORMAL LOW (ref 3.5–5.2)
Alk Phosphatase: 133 U/L — ABNORMAL HIGH (ref 35–104)
Anion Gap: 15 mmol/L — ABNORMAL HIGH (ref 2–14)
BUN/Creatinine Ratio: 5 — ABNORMAL LOW (ref 12–20)
BUN: 27 mg/dL — ABNORMAL HIGH (ref 6–20)
CO2: 28 mmol/L (ref 20–29)
Calcium: 8.7 mg/dL (ref 8.6–10.0)
Chloride: 88 mmol/L — ABNORMAL LOW (ref 98–107)
Creatinine: 5.63 mg/dL — ABNORMAL HIGH (ref 0.60–1.00)
Est, Glom Filt Rate: 9 ml/min/1.73m2 — ABNORMAL LOW (ref >59)
Globulin: 3.2 g/dL (ref 2.0–4.0)
Glucose: 182 mg/dL — ABNORMAL HIGH (ref 65–100)
Potassium: 3.8 mmol/L (ref 3.5–5.1)
Sodium: 130 mmol/L — ABNORMAL LOW (ref 136–145)
Total Bilirubin: 0.4 mg/dL (ref 0.0–1.2)
Total Protein: 6.2 g/dL — ABNORMAL LOW (ref 6.4–8.3)

## 2024-08-22 LAB — POCT GLUCOSE
POC Glucose: 117 mg/dL (ref 65–117)
POC Glucose: 184 mg/dL — ABNORMAL HIGH (ref 65–117)
POC Glucose: 187 mg/dL — ABNORMAL HIGH (ref 65–117)
POC Glucose: 263 mg/dL — ABNORMAL HIGH (ref 65–117)
POC Glucose: 293 mg/dL — ABNORMAL HIGH (ref 65–117)

## 2024-08-22 LAB — CBC WITH AUTO DIFFERENTIAL
Basophils %: 0.8 % (ref 0.0–1.0)
Basophils Absolute: 0.06 K/UL (ref 0.00–0.10)
Eosinophils %: 1 % (ref 0.0–7.0)
Eosinophils Absolute: 0.08 K/UL (ref 0.00–0.40)
Hematocrit: 36.8 % (ref 35.0–47.0)
Hemoglobin: 11.6 g/dL (ref 11.5–16.0)
Immature Granulocytes %: 0.3 % (ref 0.0–0.5)
Immature Granulocytes Absolute: 0.02 K/UL (ref 0.00–0.04)
Lymphocytes %: 19.8 % (ref 12.0–49.0)
Lymphocytes Absolute: 1.58 K/UL (ref 0.80–3.50)
MCH: 30.2 pg (ref 26.0–34.0)
MCHC: 31.5 g/dL (ref 30.0–36.5)
MCV: 95.8 FL (ref 80.0–99.0)
MPV: 9.2 FL (ref 8.9–12.9)
Monocytes %: 10.6 % (ref 5.0–13.0)
Monocytes Absolute: 0.85 K/UL (ref 0.00–1.00)
Neutrophils %: 67.5 % (ref 32.0–75.0)
Neutrophils Absolute: 5.4 K/UL (ref 1.80–8.00)
Nucleated RBCs: 0 /100{WBCs}
Platelets: 456 K/uL — ABNORMAL HIGH (ref 150–400)
RBC: 3.84 M/uL (ref 3.80–5.20)
RDW: 17.3 % — ABNORMAL HIGH (ref 11.5–14.5)
WBC: 8 K/uL (ref 3.6–11.0)
nRBC: 0 K/uL (ref 0.00–0.01)

## 2024-08-22 LAB — MAGNESIUM: Magnesium: 2.2 mg/dL (ref 1.6–2.6)

## 2024-08-22 LAB — PHOSPHORUS: Phosphorus: 3.3 mg/dL (ref 2.5–4.5)

## 2024-08-22 MED ORDER — CLONIDINE 0.2 MG/24HR TD PTWK
0.2 | TRANSDERMAL | Status: DC
Start: 2024-08-22 — End: 2024-08-28
  Administered 2024-08-22: 18:00:00 1 via TRANSDERMAL

## 2024-08-22 MED FILL — HYDROMORPHONE HCL 1 MG/ML IJ SOLN: 1 mg/mL | INTRAMUSCULAR | Qty: 1 | Fill #0

## 2024-08-22 MED FILL — SODIUM BICARBONATE 650 MG PO TABS: 650 mg | ORAL | Qty: 1 | Fill #0

## 2024-08-22 MED FILL — HYDROMORPHONE HCL 1 MG/ML IJ SOLN: 1 mg/mL | INTRAMUSCULAR | Qty: 1

## 2024-08-22 MED FILL — SENNOSIDES-DOCUSATE SODIUM 8.6-50 MG PO TABS: 8.6-50 mg | ORAL | Qty: 1 | Fill #0

## 2024-08-22 MED FILL — CLONIDINE 0.2 MG/24HR TD PTWK: 0.2 mg/(24.h) | TRANSDERMAL | Qty: 1 | Fill #0

## 2024-08-22 MED FILL — HEPARIN SODIUM (PORCINE) 5000 UNIT/ML IJ SOLN: 5000 [IU]/mL | INTRAMUSCULAR | Qty: 1

## 2024-08-22 MED FILL — DICYCLOMINE HCL 10 MG PO CAPS: 10 mg | ORAL | Qty: 1 | Fill #0

## 2024-08-22 MED FILL — METOCLOPRAMIDE HCL 10 MG/10ML PO SOLN: 10 mg/mL | ORAL | Qty: 10 | Fill #0

## 2024-08-22 MED FILL — INSULIN LISPRO 100 UNIT/ML IJ SOLN: 100 [IU]/mL | INTRAMUSCULAR | Qty: 4 | Fill #0

## 2024-08-22 MED FILL — VITAMIN B-6 50 MG PO TABS: 50 mg | ORAL | Qty: 1 | Fill #0

## 2024-08-22 MED FILL — BUSPIRONE HCL 10 MG PO TABS: 10 mg | ORAL | Qty: 1 | Fill #0

## 2024-08-22 MED FILL — ONDANSETRON HCL 4 MG/2ML IJ SOLN: 4 MG/2ML | INTRAMUSCULAR | Qty: 2

## 2024-08-22 MED FILL — HYDRALAZINE HCL 25 MG PO TABS: 25 mg | ORAL | Qty: 4 | Fill #0

## 2024-08-22 MED FILL — OXYCODONE HCL 10 MG/0.5ML PO CONC: 10 MG/0.5ML | ORAL | Qty: 0.5 | Fill #0

## 2024-08-22 MED FILL — SEVELAMER CARBONATE 0.8 G PO PACK: 0.8 g | ORAL | Qty: 1 | Fill #0

## 2024-08-22 MED FILL — CARVEDILOL 12.5 MG PO TABS: 12.5 mg | ORAL | Qty: 2 | Fill #0

## 2024-08-22 MED FILL — ONDANSETRON HCL 4 MG/2ML IJ SOLN: 4 MG/2ML | INTRAMUSCULAR | Qty: 2 | Fill #0

## 2024-08-22 MED FILL — SCOPOLAMINE 1 MG/3DAYS TD PT72: 1 MG/3DAYS | TRANSDERMAL | Qty: 1

## 2024-08-22 MED FILL — ACETAMINOPHEN EXTRA STRENGTH 500 MG PO TABS: 500 mg | ORAL | Qty: 2 | Fill #0

## 2024-08-22 MED FILL — CLOPIDOGREL BISULFATE 75 MG PO TABS: 75 mg | ORAL | Qty: 1 | Fill #0

## 2024-08-22 MED FILL — ASPIRIN 81 MG PO CHEW: 81 mg | ORAL | Qty: 1 | Fill #0

## 2024-08-22 MED FILL — ATORVASTATIN CALCIUM 20 MG PO TABS: 20 mg | ORAL | Qty: 4

## 2024-08-22 MED FILL — DULOXETINE HCL 30 MG PO CPEP: 30 mg | ORAL | Qty: 2 | Fill #0

## 2024-08-22 MED FILL — LANSOPRAZOLE 15 MG PO TBDD: 15 mg | ORAL | Qty: 2

## 2024-08-22 MED FILL — HEPARIN SODIUM (PORCINE) 5000 UNIT/ML IJ SOLN: 5000 [IU]/mL | INTRAMUSCULAR | Qty: 1 | Fill #0

## 2024-08-22 MED FILL — HYDRALAZINE HCL 25 MG PO TABS: 25 mg | ORAL | Qty: 4

## 2024-08-22 MED FILL — FAMOTIDINE 20 MG PO TABS: 20 mg | ORAL | Qty: 1 | Fill #0

## 2024-08-22 MED FILL — VITAMIN B-6 50 MG PO TABS: 50 mg | ORAL | Qty: 1

## 2024-08-22 MED FILL — INSULIN LISPRO 100 UNIT/ML IJ SOLN: 100 [IU]/mL | INTRAMUSCULAR | Qty: 2

## 2024-08-22 NOTE — Flowsheet Note (Signed)
 "Primary RN SBAR: Melinda Razor, RN  Incapacitated Nurse edu. provided: Yes  Patient Education provided: Tx time, UF goal, Eating and drinking while actively vomiting   Preferred Education method and Primary language: Verbal speaks Spanish but under stands and speaks English  Dialysis consent: Chronic consent applies  Hospital General Consent Verified: Yes  Hospital associated wait time; reason: 25 mins for patient to settle down ( thrashing around in the bed due to pain and vomiting)  Hepatitis B Surface Ag   Date/Time Value Ref Range Status   08/07/2024 01:38 AM NONREACTIVE NR   Final     Hep B S Ag Interp   Date/Time Value Ref Range Status   03/16/2023 08:19 AM Negative NEG   Final     Hep B S Ab   Date/Time Value Ref Range Status   08/07/2024 01:38 AM <3.50 mIU/mL Final     Comment:        <10.00 mIU/mL     Non-Immune  =>10.00 mIU/mL    Individual is considered to be immune to infection with HBV       Hep B results, dates, and Primary Source: Neg/Sus  08/07/2024  EPIC  Hep B dual verification performed by (RN): IVAR Molt, RN  Machine disinfect process:Heat        PRE HD   08/22/24 0835   Treatment   Heb B Surface Ag Negative   Date - Heb B Surface Ag 08/07/24   Heb B Surface Ab <10   Date - Heb B Surface Ab 08/07/24   Observations & Evaluations   Level of Consciousness 0   Oriented X 4   Heart Rhythm Regular   Respiratory Quality/Effort Unlabored   O2 Device None (Room air)   Bilateral Breath Sounds Diminished   Skin Color Ecchymosis (comment)   Skin Condition/Temp Dry;Warm   Abdomen Inspection Gastrostomy;Soft   Edema None   Vital Signs   BP (!) 182/91   Temp 98.9 F (37.2 C)   Pulse (!) 107   Respirations 20   SpO2 97 %   Pain Assessment   Pain Assessment 0-10   Pain Level 10   Pain Location Abdomen   Pain Orientation Mid;Lower   Pain Descriptors Aching;Discomfort         START OF HD   08/22/24 0851   Treatment   Time On 0851   Treatment Goal 3hrs/1liter   Vital Signs   BP (!) 145/84   Pulse (!) 108   Technical  Checks   Dialysis Machine No. [id number]   RO Machine Number [id number]   Dialyzer Lot No. [id number]   Tubing Lot Number [id number]   All Connections Secure Yes   NS Bag Yes   Saline Line Double Clamped Yes   Dialyzer F-180   Prime Volume (mL) 200 mL   ICEBOAT I;C;E;B;O;A;T   RO Machine Log Sheet Completed Yes   Machine Alarm Self Test Completed;Passed   Child Psychotherapist Function   Extracorporeal Chemical Engineer Conductivity 14.2   Manual Ph 7.4   Bleach Test (Neg) Yes   Bath Temperature 98.6 F (37 C)   Treatment Initiation   Dialyze Hours 3   Treatment  Initiation Universal Precautions maintained;Lines secured to patient;Connections secured;Prime given;Venous Parameters set;Arterial Parameters set;Air foam detector engaged;Dialysate;Saline line double clamped;Dialyzer;F180   During Hemodialysis Assessment   Blood Flow Rate (ml/min) 200 ml/min   Arterial Pressure (mmHg) -70 mmHg   Venous Pressure (mmHg) 60  TMP 40   DFR 600   Comments Tx started, c/o severe stomach pain   Access Visible Yes   Ultrafiltration Rate (ml/hr) 500 ml/hr   Hemodialysis Fistula/Graft Arteriovenous fistula Left Arm   No placement date or time found.   Present on Admission/Arrival: Yes  Access Type: Arteriovenous fistula  Orientation: Left  Access Location: Arm   Site Assessment Clean, dry & intact   Thrill Present   Bruit Present   Status Accessed   Venous Needle Size 15 G   Arterial Needle Size 15 G   Accessed By: D. Waddell, RN   Access Attempts  1   Date of Last Dressing Change 08/22/24   Access Interventions Aseptic Technique;Needles taped to patient  (No s/s of infection, prepped per protocol, cannulated with 15g needles x2, secured with tape.)   Dressing Intervention New   Dressing Status New dressing applied   Dialysis Bath   K+ (Potassium) 4   Ca+ (Calcium ) 2.5   Na+ (Sodium) 138   HCO3 (Bicarb) 38         POST HD   08/22/24 1217   Treatment   Time Off 1155   Observations & Evaluations   Level  of Consciousness 0   Oriented X 4   Heart Rhythm Regular   Respiratory Quality/Effort Unlabored   O2 Device None (Room air)   Bilateral Breath Sounds Diminished   Skin Color Ecchymosis (comment)   Skin Condition/Temp Dry;Warm   Abdomen Inspection Gastrostomy;Soft   Edema None   Vital Signs   BP (!) 185/100   Temp 98.3 F (36.8 C)   Pulse (!) 111   Respirations 18   Pain Assessment   Pain Assessment 0-10   Pain Level 10   Pain Location Abdomen   Pain Orientation Lower;Mid   Pain Descriptors Aching;Discomfort   Hemodialysis Fistula/Graft Arteriovenous fistula Left Arm   No placement date or time found.   Present on Admission/Arrival: Yes  Access Type: Arteriovenous fistula  Orientation: Left  Access Location: Arm   Site Assessment Clean, dry & intact   Thrill Present   Bruit Present   Status Deaccessed   Date of Last Dressing Change 08/22/24   Access Interventions   (Needles pulled x2, hemostasis achieved with pressure, no excessive bleeding. Sites secured with gauze and tape. No s/s of infection.)   Dressing Intervention New   Dressing Status New dressing applied   Post-Hemodialysis Assessment   Post-Treatment Procedures Blood returned;Access bleeding time < 10 minutes   Art Therapist   Rinseback Volume (ml) 300 ml   Blood Volume Processed (Liters) 58.5 L   Dialyzer Clearance Lightly streaked   Duration of Treatment (minutes) 180 minutes   Hemodialysis Intake (ml) 500 ml   Hemodialysis Output (ml) 500 ml   NET Removed (ml) 0   Tolerated Treatment Good   Patient Response to Treatment Tolerated well, stable   Patient Disposition Return to room               Primary RN SBAR: Melinda Razor, RN  Comments: 3hr Hd treatment with 0 UF tolerated well, patient had episodes of N/V during treatment, Rested with eyes closed most of the treatment. All possible blood returned during rinseback.    "

## 2024-08-22 NOTE — Progress Notes (Signed)
 "Comprehensive Nutrition Assessment    Type and Reason for Visit: Reassess    Nutrition Recommendations/Plan:     Pt remains appropriate for PN. If desired, Provide PPN per below:   Day 1: 1008 mL, 50 g carbs, 42 g protein (D5, AA4.25% @ 42 mL/hour)  Day 2: 1512 mL, 76 g carbs, 64 g protein (D5, AA4.25% @ 63 mL/hour)  Day 3: 1800 mL, 90 g carbs, 77 g protein (D5, AA4.25% @ 75 mL/hour (goal)    - Lipids: Check triglycerides and if <400, add lipids per below  - Lipids 20% 500mL 3x/week  - Monitor K, Phos, Mg until stable x 3 days with nutrition, replace PRN  - Hold PN at current rate if electrolytes are not stable    2.   Continue to trial PO as pt desires     3.   Continue to encourage acceptance of enteral feeds per below:   - Nepro @ 35 mL/hour  - FWF 75 mL q 2 hours        Malnutrition Assessment:  Malnutrition Status:  Insufficient data (08/07/24 1316)    Context:  Chronic Illness     Findings of the 6 clinical characteristics of malnutrition:  Energy Intake:  Mild decrease in energy intake  Weight Loss:  Mild weight loss     Body Fat Loss:  Unable to assess     Muscle Mass Loss:  Unable to assess    Fluid Accumulation:  No fluid accumulation    Grip Strength:  Not Performed     Nutrition Assessment:    43 yo female admitted for HTN emergency, N/V. Pmhx: DM, Gastroparesis, HTN, ESRD - on HD, GJ tube.      11.12: Follow Up. Pt remains appropriate for PPN however previous Attending did not favor this. Tube feeds have continued to be refused yesterday and this AM. Pt is frequently eating snack items including applesauce, ice cream and apples, however she has frequent episodes of emesis after PO intake. Has not been complying with recommendations from nursing or myself. Refusing some aspects of care this AM. Currently OTF for scheduled MWF HD. Will continue to leave PN recs above. TF order will remain - spoke with nursing about continued encouragement for TF acceptance. Was hoping by now pt would understand TF is not  what's leading to emesis. This has been explained to her many times by attending, GI, and RD. Labs reviewed, noted hyponatremia.     11.10: Follow Up. Last Friday, pt became adamant about refusing TF and only wishing to trial PO diet. This was done over the weekend and pt has continued with N/V 1-2 hours after postprandial - regardless of what's eaten. Continues to refuse TF this AM and is still unable to tolerate PO w/o N/V soon after. Pt appropriate for PN at this point. Labs reviewed. Will reach out to attending regarding PN ordering.     PPN at goal rate 75 mL/hour with lipids 3x/week provides 1038 kcal (76% needs); 77 g protein (100% needs)    11.07: Follow Up. TF ran yesterday at 35 mL/hr goal - stopped overnight after emesis. This occurred after diet was advanced to regular per pt request and a full meal was eaten. Likely too ambitious after NPO x7 days. Given jejunum placement of TF, I doubt it alone caused emesis especially at low rate of feeds. Was likely combination of full meal + TF ISO of gastroparesis and prior period of NPO. Advocated for continuing TF at  goal rate and restricting diet to full liquids. Need to take diet advancement slow to assess tolerance. No family in room bedside when I stopped by - will plan to check after noon. Will continue TF @ 35 mL/hr and monitor tolerance. Labs reviewed, lytes WNL.    11/6: Follow up. TF has not been ran since morning of 11.05. PN order placed but has not been delivered due to inadequate access. GI assessment yesterday concluded small frequent emesis is not composed of or caused by tube feeds. Will re-start TF today. Modifying order from cyclic to continuous and will attempt to meet needs with TF alone. Per nurse, small emesis has continued this AM - clear/mucus. Will trial continuous TF today and reassess tomorrow.     Trickle feeds Nepro @ 35 mL/hour provides 1386 kcal (100% needs),123 g carbs, 62 g protein (90% needs) TF + FWF provides 1384 H2O/day.      11/5:  Follow up.  TF ran over night 11/3 as Nepro at 30 ml/hr x 10 hours which she tolerated with no complaints.  Last night we increased the rate to 40 ml/hr and she had n/v.  NPO now.  GI to see.  Spoke with daughter who states feeding was administered through J-port.  Will need to start PN until pt able to tolerate PO/TF.    PPN rec's:  goal rate of D5, 4.25% AA at 83 ml/hr + 250 ml 20% lipids MWF (TG level of 352).    This will provide 890 kcals (65% kcal needs), 85 gm protein (100% protein needs)    11/3: Follow up.  Pt remains NPO.  Have reached out to MD regarding need to start nutrition support, J-tube feeds vs PPN.  Still having N/V.  No new weight. TF rec's below in bold.      10/30:  Follow up.  Pt remains NPO.  Will recommend starting TF via J port.  Still having N/V.  No new weight.      10/28: MD consult received for TF rec's. Chart review indicate G-J tube placed in August 2025 and started on cyclic TF.  Spoke with pt's daughter at bedside.  She reports pt eats by mouth at home.  Some days she has n/v and is not able to tolerate PO so she gives herself 3 bolus feeds of NovaSource Renal 2.0 3x/day.  Pt and daughter unsure how much she gives herself, 2 large syringes full of feed.  On days when she eats and tolerates PO then she does not use feeding tube or might do one feed at night.  When I asked what foods she eats by mouth, she told me soups, cooked vegetables, and fruits.  Discussed low fiber diet necessary for gastroparesis, recommended all vegetables be cooked very soft, no beans in her soups, and fruits be canned.  Daughter states she has been losing weight, unable to quantify.  Weight hx in EMR indicates 6% loss over last 5 months which is not significant for time frame.  Weight seems to fluctuate between 115-135 lbs over last 1.5 years.       Pt currently NPO, MD wants to hold off on starting TF.  Pt was actively vomiting during my visit.  Did not complete NFPE because of this.      TF  rec's when ready to start TF:  Nepro at 40 ml/hr x 10 hours + 1 ProSource daily + 30 ml h20 flush q4h around the clock.  This will provide 400 ml,  800 kcals (59% kcal needs), 52 gm protein (74% protein needs), 64 gm CHO, 290 + 30 + 180 = 500 ml water           Nutritionally Significant Medications:  NaCl at 100 ml/hr; Epo, Renvela , Vit B6    Estimated Daily Nutrient Needs:  Energy Requirements Based On: Kcal/kg  Weight Used for Energy Requirements: Current  Energy (kcal/day): 1365-1640 (25-30 kcals/kg)  Weight Used for Protein Requirements: Current  Protein (g/day): 70 (1.3 gm/kg)  Method Used for Fluid Requirements: 1 ml/kcal  Fluid (ml/day): 1365    Nutrition Related Findings:   Edema: None   Edema Generalized: Trace  RUE Edema: +1           Recent Labs     08/20/24  0519 08/21/24  0335 08/22/24  0454   GLUCOSE 151* 263* 182*   BUN 23* 19 27*   CREATININE 5.89* 4.44* 5.63*   NA 141 137 130*   K 4.1 4.0 3.8   CL 98 95* 88*   CO2 29 31* 28   CALCIUM  9.2 8.6 8.7   PHOS  --  3.1 3.3   MG  --  2.1 2.2     Recent Labs     08/19/24  1313 08/19/24  1605 08/19/24  1947 08/20/24  1124 08/20/24  1618 08/20/24  2008 08/21/24  0841 08/21/24  1227 08/21/24  1900 08/21/24  1945 08/22/24  0804   POCGLU 106 178* 201* 188* 219* 175* 194* 229* 187* 184* 263*     Lab Results   Component Value Date/Time    LABA1C 6.1 06/27/2024 05:10 AM    LABA1C 7.0 02/19/2024 05:02 AM    LABA1C 6.8 10/24/2023 03:52 AM    EAG 127 06/27/2024 05:10 AM     Triglycerides   Date Value Ref Range Status   08/14/2024 352 (H) 0 - 150 MG/DL Final     Comment:     Borderline High: 150-199 mg/dL, High: 799-500 mg/dL  Very High: Greater than or equal to 500 mg/dL     89/71/7974 828 (H) 0 - 150 MG/DL Final     Comment:     Borderline High: 150-199 mg/dL, High: 799-500 mg/dL  Very High: Greater than or equal to 500 mg/dL     98/98/7974 844 (H) <150 MG/DL Final     Comment:     Based on NCEP-ATP III:  Triglycerides <150 mg/dL  is considered normal, 150-199 mg/dL   borderline high,  799-500 mg/dL high and  greater than or equal to 500 mg/dL very high.       Last BM: 08/11/24    Wounds: Wound Type: None    Current Nutrition Therapies:  Diet: NPO  Supplements: none  Meal Intake:   No data found.  Supplement Intake:  No data found.  Nutrition Support: none    Anthropometric Measures:  Height: 149.9 cm (4' 11.02)  Ideal Body Weight (IBW): 95 lbs (43 kg)       Current Body Weight: 54.6 kg (120 lb 5.9 oz), 126.7 % IBW. Weight Source: Bed scale  Current BMI (kg/m2): 24.3        Weight Adjustment For: No Adjustment                 BMI Categories: Normal Weight (BMI 18.5-24.9)    Wt Readings from Last 10 Encounters:   08/06/24 54.6 kg (120 lb 5.9 oz)   07/20/24 57.2 kg (126 lb 1.7 oz)   07/11/24 54.6  kg (120 lb 5.9 oz)   07/09/24 55.5 kg (122 lb 5.7 oz)   03/06/24 57.6 kg (127 lb)   02/21/24 58 kg (127 lb 13.9 oz)   11/28/23 52.2 kg (115 lb 1.3 oz)   10/31/23 52.6 kg (115 lb 15.4 oz)   10/21/23 55.2 kg (121 lb 11.1 oz)   10/12/23 60.4 kg (133 lb 3.2 oz)       Nutrition Diagnosis:   Inadequate protein-energy intake related to nausea/vomiting/diarrhea as evidenced by poor intake prior to admission    Nutrition Interventions:   Food and/or Nutrient Delivery: Continue Current Diet, Continue Current Tube Feeding  Nutrition Education/Counseling: Education/Counseling initiated  Coordination of Nutrition Care: Continue to monitor while inpatient       Goals:  Previous Goal Met: Goal(s) Achieved  Goals: Tolerate nutrition support at goal rate, by next RD assessment       Nutrition Monitoring and Evaluation:   Behavioral-Environmental Outcomes: None Identified  Food/Nutrient Intake Outcomes: Diet Advancement/Tolerance, Food and Nutrient Intake, Enteral Nutrition Intake/Tolerance  Physical Signs/Symptoms Outcomes: Biochemical Data, Nausea or Vomiting, Weight    Discharge Planning:    Too soon to determine     Victory Ranger, MS, RD  Available via [employer]    "

## 2024-08-22 NOTE — Progress Notes (Signed)
"  Patient has been offered bathing and linen changes multiple times this shift and refuses both. Patient smells of urine and linen on bed has stains all over it and patient refuses.    Patient is actively vomiting and continues to come to the freezer and get ice cream and ice pops. Patient was educated to try and give stomach a rest while vomiting and to not eat for a while after vomiting to reduce stomach irritation and patient still does not follow directions that were given.     "

## 2024-08-22 NOTE — Progress Notes (Addendum)
 "                            [name]  Date of Birth: 30-Aug-1981          Assessment & Plan:     ESRD on HD MWF via [facility], [facility], f/b Dr. Kalvin  Hypertensive urgency-better after HD  DM  AOCD  Chronic abd pain, n/v from gastroparesis-reason for admit  Staph bacteremia cxs now negative     Plan:  HD today and MWF  Decreased UF to 1L; Bps drop quickly on HD. Intermittent po intake and has been refusing her tube feeds  Agree with stopping nifedipine  as can't be crushed for J tube and adding clonidine  patch  ESA MWF  Needs cefazolin  2/2/3 post HD until 09/19/24-spoke with her clinic and they are aware  If she needs IV access-please place [facility]. We need to preserve her arm veins for dialysis       Subjective:   CC: ESRD  HPI: Seen on HD. Vomiting. Lots of abd pain.     Current Facility-Administered Medications   Medication Dose Route Frequency    cloNIDine  (CATAPRES ) 0.2 MG/24HR 1 patch  1 patch TransDERmal Weekly    ondansetron  (ZOFRAN ) injection 4 mg  4 mg IntraMUSCular Q6H    lansoprazole  (PREVACID  SOLUTAB) disintegrating tablet 30 mg  30 mg Per G Tube BID    metoclopramide  (REGLAN ) 5 MG/5ML solution 5 mg  5 mg Per G Tube TID AC    prochlorperazine  (COMPAZINE ) suppository 25 mg  25 mg Rectal Q12H PRN    HYDROmorphone  HCl PF (DILAUDID ) injection 0.5 mg  0.5 mg IntraMUSCular Q4H PRN    [Held by provider] prochlorperazine  (COMPAZINE ) injection 10 mg  10 mg IntraVENous Q6H    [Held by provider] ondansetron  (ZOFRAN ) injection 4 mg  4 mg IntraVENous Q6H    sodium chloride  flush 0.9 % injection 5-40 mL  5-40 mL IntraVENous 2 times per day    sodium chloride  flush 0.9 % injection 5-40 mL  5-40 mL IntraVENous PRN    0.9 % sodium chloride  infusion   IntraVENous PRN    lidocaine  PF 1 % injection 50 mg  50 mg IntraDERmal Once    dextrose  bolus 10% 125 mL  125 mL IntraVENous PRN    Or    dextrose  bolus 10% 250 mL  250 mL IntraVENous PRN    glucagon  injection 1 mg  1 mg  SubCUTAneous PRN    dextrose  10 % infusion   IntraVENous Continuous PRN    hydrALAZINE  (APRESOLINE ) tablet 100 mg  100 mg Per G Tube TID    glucose chewable tablet 16 g  4 tablet Per G Tube PRN    [Held by provider] HYDROmorphone  HCl PF (DILAUDID ) injection 0.5 mg  0.5 mg IntraVENous Q4H PRN    Or    [Held by provider] HYDROmorphone  HCl PF (DILAUDID ) injection 1 mg  1 mg IntraVENous Q4H PRN    labetalol  (NORMODYNE ;TRANDATE ) injection 10 mg  10 mg IntraVENous Q4H PRN    acetaminophen  (TYLENOL ) tablet 1,000 mg  1,000 mg PEG Tube 4 times per day    dicyclomine  (BENTYL ) capsule 10 mg  10 mg PEG Tube TID AC    polyethylene glycol (GLYCOLAX ) packet 17 g  17 g Per G Tube Daily PRN    sevelamer  (RENVELA ) packet 0.8 g  0.8 g Per G  Tube TID WC    sodium bicarbonate  tablet 650 mg  650 mg PEG Tube TID    oxyCODONE  (ROXICODONE  INTENSOL) 100 MG/5ML concentrated solution 5 mg  5 mg Per G Tube Q4H PRN    bisacodyl  (DULCOLAX) suppository 10 mg  10 mg Rectal Daily PRN    [Held by provider] epoetin  alfa-epbx (RETACRIT ) injection 10,000 Units  10,000 Units SubCUTAneous Q MWF    aspirin  chewable tablet 81 mg  81 mg Per G Tube Daily    atorvastatin  (LIPITOR ) tablet 80 mg  80 mg Per G Tube QHS    busPIRone  (BUSPAR ) tablet 5 mg  5 mg Per G Tube BID    clopidogrel  (PLAVIX ) tablet 75 mg  75 mg Per G Tube Daily    DULoxetine  (CYMBALTA ) extended release capsule 60 mg  60 mg Per G Tube Daily    famotidine  (PEPCID ) tablet 20 mg  20 mg Per G Tube Daily    sennosides-docusate sodium  (SENOKOT-S) 8.6-50 MG tablet 1 tablet  1 tablet Per G Tube BID    vitamin B-6 (PYRIDOXINE ) tablet 25 mg  25 mg Per G Tube Q6H    ceFAZolin  (ANCEF ) 1,000 mg in sterile water  10 mL IV syringe  1,000 mg IntraVENous Daily    0.9 % sodium chloride  infusion   IntraVENous PRN    carvedilol  (COREG ) tablet 25 mg  25 mg Per G Tube BID WC    scopolamine  (TRANSDERM-SCOP) transdermal patch 1 patch  1 patch TransDERmal Q72H    albumin  human 25% IV solution 25 g  25 g IntraVENous PRN     0.9 % sodium chloride  infusion   IntraVENous PRN    heparin  (porcine) injection 5,000 Units  5,000 Units SubCUTAneous 3 times per day    insulin  lispro (HUMALOG ,ADMELOG ) injection vial 0-8 Units  0-8 Units SubCUTAneous 4x Daily AC & HS          Objective:     Vitals:  Blood pressure 131/69, pulse 99, temperature 98.9 F (37.2 C), resp. rate 20, height 1.499 m (4' 11.02), weight 54.6 kg (120 lb 5.9 oz), SpO2 97%.  Temp (24hrs), Avg:98.1 F (36.7 C), Min:97.2 F (36.2 C), Max:98.9 F (37.2 C)      Intake and Output:  No intake/output data recorded.  11/10 1901 - 11/12 0700  In: 748   Out: -     Physical Exam:               GENERAL ASSESSMENT: uncomfortable, emesis on gown  HEENT: Nontraumatic   EXTREMITY: no EDEMA; LUE AVG NT, no swelling/warmth, good t/b  NEURO: Grossly non focal          ECG/rhythm:    Data Review        Recent Labs     08/20/24  0519 08/21/24  0335 08/22/24  0454   NA 141 137 130*   K 4.1 4.0 3.8   CL 98 95* 88*   CO2 29 31* 28   BUN 23* 19 27*   CREATININE 5.89* 4.44* 5.63*   GLUCOSE 151* 263* 182*   CALCIUM  9.2 8.6 8.7       : Damien FORBES Rubin DEVONNA  08/22/2024        [facility]:  [email or web address]  [email or web address]    [place] office:  [address] [place], [address]  [place], [place] [address]  Phone: [phone]  Fax :     [phone]    [place] office:  [address]  [place], [place]   [address]  Phone - [phone]  Fax - [phone]                                                                                        "

## 2024-08-22 NOTE — BSMART Note (Signed)
"  Pt was out of her room for dialysis. Liaison team will follow-up tomorrow.   "

## 2024-08-22 NOTE — Dialysis (Signed)
"  Hep B verification performed for (RN): Arland Birmingham, RN  Hep B results, dates, and Primary Source: HepBsAG Negative on 08/07/24; HepBsAB Susceptible on 08/07/24; Epic  Hepatitis B Surface Ag   Date/Time Value Ref Range Status   08/07/2024 01:38 AM NONREACTIVE NR   Final     Hep B S Ag Interp   Date/Time Value Ref Range Status   03/16/2023 08:19 AM Negative NEG   Final     Hep B S Ab   Date/Time Value Ref Range Status   08/07/2024 01:38 AM <3.50 mIU/mL Final     Comment:        <10.00 mIU/mL     Non-Immune  =>10.00 mIU/mL    Individual is considered to be immune to infection with HBV       Machine disinfect process:heat  "

## 2024-08-22 NOTE — Progress Notes (Signed)
 "        [facility]                                                                                          Hospitalist Progress Note  Altamese Irani, DO  Office Phone: [phone]        Date of Service:  08/22/2024  NAME:  Melinda [name]  DOB:  12/03/1980  MRN:  [id number]     HPI:     Follow-up gastroparesis.  Was on scheduled her Compazine  and Zofran .  However, still no IV access. Attempting to reestablish IV access; likely hohn per renal.  Continues to have abdominal pain with nausea vomiting.  Resume J-tube feeds     Assessment & Plan:     43 year old female with ESRD, type 2 diabetes, hypertension, admitted with acute on chronic vomiting and abdominal pain likely secondary to gastroparesis.    Gastroparesis with emesis abdominal pain, status post GJ tube  - Per GI note, patient has intractable vomiting that is difficult to treat, but should continue to use her GJ tube  - Holding off on catheter for long-term TPN  - Make every effort to maintain nutrition through tube feeds  -strict npo pending KUB  - PPI and Pepcid   - Was on scheduled Zofran  and Reglan  however holding due to lack of IV access and suppository orders in place  - Continue scopolamine  patch  - bowel reg  - Resume tube feeds as able    Sepsis secondary to MSSA bacteremia  - Per ID, cefazolin  2 g on HD days through 09/19/2024    Prolonged QT  - Monitor     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  POA  - Phosphorus trended down  - Continue sevelamer       Fentanyl  positive: Seen on drug screen.  - May be a false positive in the setting of Benadryl  use.  - Patient adamantly denies use.     Hypertension POA  Elevated BPs  - Continue home Coreg  and hydralazine   - stop nifedipine  extended release as cannot be crushed through J-tube; will start clonidine  patch  Monitor     ESRD  On HD  Nephrology following     GERD: Chronic.    Continue Pepcid  and pantop     T2DM with gastroparesis. POA, chronic.  Last  A1c of 6.1 on 9/17.    -Sliding scale insulin .      Chronic pain or anxiety/depression  -Duloxetine  and BuSpar .     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 Mesenteric MRA 01/22/24 with proximally and distally patent SMA and diffuse anasarca.   - will continue plavix  and ASA     Anemia: Trend.  Follow-up outpatient       Code status:   Full Code  DVT Prophylaxis: heparin   Anticipated Disposition: home once symptoms and BP improved       Review of Systems:   Pertinent symptoms noted in the HPI    Vital  Signs:    Last 24hrs VS reviewed since prior progress note. Most recent are:  Vitals:    08/22/24 1100   BP: 126/69   Pulse: 98   Resp:    Temp:    SpO2:        Physical Examination:     I had a face to face encounter with this patient and independently examined them on 08/22/2024 as outlined below:        General : With nausea and vomiting  HEENT: moist mucus membranes  Chest: CTAB, normal WOB  CVS: S1 and S2 heard, no m/g/r  Abd: non distended. Ttp   Ext: WWP, no edema  Neuro/Psych: no focal deficits  Skin: no rash      Labs and imaging:     Recent Labs     08/21/24  0335 08/22/24  0454   WBC 8.8 8.0   HGB 12.7 11.6   HCT 39.3 36.8   PLT 481* 456*     Recent Labs     08/20/24  0519 08/21/24  0335 08/22/24  0454   NA 141 137 130*   K 4.1 4.0 3.8   CL 98 95* 88*   CO2 29 31* 28   BUN 23* 19 27*   MG  --  2.1 2.2   PHOS  --  3.1 3.3     Recent Labs     08/20/24  0519 08/21/24  0335 08/22/24  0454   ALT <5* <5* <5*   GLOB 3.9 3.2 3.2     No results for input(s): INR, APTT in the last 72 hours.    Invalid input(s): PTP   No results for input(s): TIBC in the last 72 hours.    Invalid input(s): FE, PSAT, FERR   No results found for: RBCF   No results for input(s): PH, PCO2, PO2 in the last 72 hours.  No results for input(s): CPK in the last 72 hours.    Invalid input(s): CPKMB, CKNDX, TROIQ  Lab Results   Component Value  Date/Time    CHOL 192 08/07/2024 11:19 AM    HDL 80 08/07/2024 11:19 AM    LDL 78 08/07/2024 11:19 AM    LDL Not calculated due to elevated triglyceride level 09/16/2022 10:30 AM     No results found for: GLUCPOC  @LABUA @      US  EXTREMITY LEFT NON VASC LIMITED   Final Result   An ill-defined, heterogeneously hypoechoic mass anterior to the AV fistula graft   measures 6 x 12 x 16 mm. Internal blood flow cannot be determined due to   adjacent arterial flow causing artifact. Phlegmon or old hematoma are possible.   It is not a circumscribed, walled off abscess. The patient reports that this has   been palpable for 2 years, favoring old hematoma.         Electronically signed by Medford Manners      MRI LUMBAR SPINE W WO CONTRAST   Final Result      Degenerative changes are conspicuous at the cervical spine.      Moderate to severe canal stenosis at C4-5 and moderate canal stenosis at C3-C4.      No other canal or foraminal compromise is identified.      There is no fracture or dislocation.      There is no cord signal abnormality or abnormal intrathecal enhancement. No   evidence of discitis/osteomyelitis.  Electronically signed by JIMMY RABON      MRI THORACIC SPINE W WO CONTRAST   Final Result      Degenerative changes are conspicuous at the cervical spine.      Moderate to severe canal stenosis at C4-5 and moderate canal stenosis at C3-C4.      No other canal or foraminal compromise is identified.      There is no fracture or dislocation.      There is no cord signal abnormality or abnormal intrathecal enhancement. No   evidence of discitis/osteomyelitis.                     Electronically signed by JIMMY RABON      MRI CERVICAL SPINE W WO CONTRAST   Final Result      Degenerative changes are conspicuous at the cervical spine.      Moderate to severe canal stenosis at C4-5 and moderate canal stenosis at C3-C4.      No other canal or foraminal compromise is identified.      There is no fracture  or dislocation.      There is no cord signal abnormality or abnormal intrathecal enhancement. No   evidence of discitis/osteomyelitis.                     Electronically signed by JIMMY HABIB      CT Upper Extremity Left W Contrast   Final Result   Nonspecific 2 cm area of subcutaneous attenuation anterior to the   distal graft anastomosis. A defined collection is not shown. Further evaluation   with ultrasound at the distal anastomotic site of the graft is recommended to   exclude small abscess.         Electronically signed by Alm Edman, MD      XR CHEST PORTABLE   Final Result   No acute cardiopulmonary process.      Electronically signed by Harden Cahill      CT ABDOMEN PELVIS WO CONTRAST Additional Contrast? None   Final Result   No acute intraperitoneal process is identified.   Gastrojejunostomy.   Colonic diverticulosis with no diverticulitis.   Incidental and/or nonemergent findings are as described in detail above.         Electronically signed by JIMMY HABIB          08/06/24    ECHO (TTE) COMPLETE (PRN CONTRAST/BUBBLE/STRAIN/3D) 08/12/2024  1:21 PM (Final)    Interpretation Summary    Left Ventricle: Low normal left ventricular systolic function. EF by visual approximation is 55%. Left ventricle size is normal. Findings consistent with moderate concentric hypertrophy. Normal wall motion. Diastolic dysfunction present with normal LV EF.    Right Ventricle: Right ventricle size is normal. Normal systolic function.    Aortic Valve: Mildly calcified cusps. Mild sclerosis of the aortic valve cusps.    Mitral Valve: There is mild annular calcification noted.    Image quality is good.    Signed by: Vikas K Rathi, MD on 08/12/2024  1:21 PM        Level of Service Documentation     In caring for this patient today, I reviewed recent notes, interpreted lab results and imaging tests, had a face-to-face encounter with the patient, performing the medically necessary appropriate exam and history, counseled the  patient/family/caregiver, ordered medications, tests and procedures as indicated for diagnostic and therapeutic purposes, consulted and communicated with other healthcare professionals including care coordination and discharge  planning, and documented clinical information in the electronic health record.  ______________________________________________________________________                 Altamese Irani, DO     "

## 2024-08-22 NOTE — Progress Notes (Signed)
"  Patient refusing medication due to vomiting.   "

## 2024-08-23 ENCOUNTER — Ambulatory Visit: Payer: PRIVATE HEALTH INSURANCE

## 2024-08-23 LAB — CBC WITH AUTO DIFFERENTIAL
Basophils %: 1 % (ref 0.0–1.0)
Basophils Absolute: 0.07 K/UL (ref 0.00–0.10)
Eosinophils %: 0.7 % (ref 0.0–7.0)
Eosinophils Absolute: 0.05 K/UL (ref 0.00–0.40)
Hematocrit: 35.7 % (ref 35.0–47.0)
Hemoglobin: 11.2 g/dL — ABNORMAL LOW (ref 11.5–16.0)
Immature Granulocytes %: 0.3 % (ref 0.0–0.5)
Immature Granulocytes Absolute: 0.02 K/UL (ref 0.00–0.04)
Lymphocytes %: 18.3 % (ref 12.0–49.0)
Lymphocytes Absolute: 1.26 K/UL (ref 0.80–3.50)
MCH: 30.6 pg (ref 26.0–34.0)
MCHC: 31.4 g/dL (ref 30.0–36.5)
MCV: 97.5 FL (ref 80.0–99.0)
MPV: 9 FL (ref 8.9–12.9)
Monocytes %: 11.9 % (ref 5.0–13.0)
Monocytes Absolute: 0.82 K/UL (ref 0.00–1.00)
Neutrophils %: 67.8 % (ref 32.0–75.0)
Neutrophils Absolute: 4.68 K/UL (ref 1.80–8.00)
Nucleated RBCs: 0 /100{WBCs}
Platelets: 425 K/uL — ABNORMAL HIGH (ref 150–400)
RBC: 3.66 M/uL — ABNORMAL LOW (ref 3.80–5.20)
RDW: 17 % — ABNORMAL HIGH (ref 11.5–14.5)
WBC: 6.9 K/uL (ref 3.6–11.0)
nRBC: 0 K/uL (ref 0.00–0.01)

## 2024-08-23 LAB — COMPREHENSIVE METABOLIC PANEL
ALT: <5 U/L — ABNORMAL LOW (ref 10–35)
AST: 34 U/L (ref 10–35)
Albumin/Globulin Ratio: 0.9 — ABNORMAL LOW (ref 1.1–2.2)
Albumin: 2.8 g/dL — ABNORMAL LOW (ref 3.5–5.2)
Alk Phosphatase: 120 U/L — ABNORMAL HIGH (ref 35–104)
Anion Gap: 9 mmol/L (ref 2–14)
BUN/Creatinine Ratio: 4 — ABNORMAL LOW (ref 12–20)
BUN: 14 mg/dL (ref 6–20)
CO2: 32 mmol/L — ABNORMAL HIGH (ref 20–29)
Calcium: 8.4 mg/dL — ABNORMAL LOW (ref 8.6–10.0)
Chloride: 95 mmol/L — ABNORMAL LOW (ref 98–107)
Creatinine: 4.08 mg/dL — ABNORMAL HIGH (ref 0.60–1.00)
Est, Glom Filt Rate: 13 ml/min/1.73m2 — ABNORMAL LOW (ref >59)
Globulin: 3.2 g/dL (ref 2.0–4.0)
Glucose: 190 mg/dL — ABNORMAL HIGH (ref 65–100)
Potassium: 4.1 mmol/L (ref 3.5–5.1)
Sodium: 136 mmol/L (ref 136–145)
Total Bilirubin: 0.3 mg/dL (ref 0.0–1.2)
Total Protein: 6 g/dL — ABNORMAL LOW (ref 6.4–8.3)

## 2024-08-23 LAB — POCT GLUCOSE
POC Glucose: 87 mg/dL (ref 65–117)
POC Glucose: 187 mg/dL — ABNORMAL HIGH (ref 65–117)
POC Glucose: 211 mg/dL — ABNORMAL HIGH (ref 65–117)
POC Glucose: 176 mg/dL — ABNORMAL HIGH (ref 65–117)

## 2024-08-23 MED FILL — SEVELAMER CARBONATE 0.8 G PO PACK: 0.8 g | ORAL | Qty: 1 | Fill #0

## 2024-08-23 MED FILL — CARVEDILOL 12.5 MG PO TABS: 12.5 mg | ORAL | Qty: 2 | Fill #0

## 2024-08-23 MED FILL — DICYCLOMINE HCL 10 MG PO CAPS: 10 mg | ORAL | Qty: 1 | Fill #0

## 2024-08-23 MED FILL — ACETAMINOPHEN EXTRA STRENGTH 500 MG PO TABS: 500 mg | ORAL | Qty: 2 | Fill #0

## 2024-08-23 MED FILL — COMPRO 25 MG RE SUPP: 25 mg | RECTAL | Qty: 1 | Fill #0

## 2024-08-23 MED FILL — ONDANSETRON HCL 4 MG/2ML IJ SOLN: 4 MG/2ML | INTRAMUSCULAR | Qty: 2 | Fill #0

## 2024-08-23 MED FILL — VITAMIN B-6 50 MG PO TABS: 50 mg | ORAL | Qty: 1 | Fill #0

## 2024-08-23 MED FILL — OXYCODONE HCL 10 MG/0.5ML PO CONC: 10 MG/0.5ML | ORAL | Qty: 0.5 | Fill #0

## 2024-08-23 MED FILL — LANSOPRAZOLE 15 MG PO TBDD: 15 mg | ORAL | Qty: 2 | Fill #0

## 2024-08-23 MED FILL — SENNOSIDES-DOCUSATE SODIUM 8.6-50 MG PO TABS: 8.6-50 mg | ORAL | Qty: 1 | Fill #0

## 2024-08-23 MED FILL — BUSPIRONE HCL 10 MG PO TABS: 10 mg | ORAL | Qty: 1 | Fill #0

## 2024-08-23 MED FILL — SODIUM BICARBONATE 650 MG PO TABS: 650 mg | ORAL | Qty: 1 | Fill #0

## 2024-08-23 MED FILL — CLOPIDOGREL BISULFATE 75 MG PO TABS: 75 mg | ORAL | Qty: 1 | Fill #0

## 2024-08-23 MED FILL — HEPARIN SODIUM (PORCINE) 5000 UNIT/ML IJ SOLN: 5000 [IU]/mL | INTRAMUSCULAR | Qty: 1 | Fill #0

## 2024-08-23 MED FILL — HYDRALAZINE HCL 25 MG PO TABS: 25 mg | ORAL | Qty: 4 | Fill #0

## 2024-08-23 MED FILL — HYDROMORPHONE HCL 1 MG/ML IJ SOLN: 1 mg/mL | INTRAMUSCULAR | Qty: 1 | Fill #0

## 2024-08-23 MED FILL — METOCLOPRAMIDE HCL 10 MG/10ML PO SOLN: 10 mg/mL | ORAL | Qty: 10 | Fill #0

## 2024-08-23 MED FILL — INSULIN LISPRO 100 UNIT/ML IJ SOLN: 100 [IU]/mL | INTRAMUSCULAR | Qty: 1 | Fill #0

## 2024-08-23 MED FILL — ASPIRIN 81 MG PO CHEW: 81 mg | ORAL | Qty: 1 | Fill #0

## 2024-08-23 MED FILL — DULCOLAX 10 MG RE SUPP: 10 mg | RECTAL | Qty: 1 | Fill #0

## 2024-08-23 MED FILL — DULOXETINE HCL 30 MG PO CPEP: 30 mg | ORAL | Qty: 2 | Fill #0

## 2024-08-23 MED FILL — FAMOTIDINE 20 MG PO TABS: 20 mg | ORAL | Qty: 1 | Fill #0

## 2024-08-23 MED FILL — INSULIN LISPRO 100 UNIT/ML IJ SOLN: 100 [IU]/mL | INTRAMUSCULAR | Qty: 2 | Fill #0

## 2024-08-23 MED FILL — ATORVASTATIN CALCIUM 20 MG PO TABS: 20 mg | ORAL | Qty: 4 | Fill #0

## 2024-08-23 NOTE — Care Coordination (Signed)
"  Case Management Note              Per IDR patient continues to be medically managed. CM has faxed clinical updates to [facility] at [phone]. CM met with pt's daughter at bedside while patient was off the unit yesterday 11/12 and provided CM updates.               ___________________________________________   Joan Pond, RN Case Manager  08/23/2024   12:51 PM     "

## 2024-08-23 NOTE — Progress Notes (Signed)
 "Comprehensive Nutrition Assessment    Type and Reason for Visit: Reassess    Nutrition Recommendations/Plan:     Pt remains appropriate for PN. If desired, Provide PPN per below:   Day 1: 1008 mL, 50 g carbs, 42 g protein (D5, AA4.25% @ 42 mL/hour)  Day 2: 1512 mL, 76 g carbs, 64 g protein (D5, AA4.25% @ 63 mL/hour)  Day 3: 1800 mL, 90 g carbs, 77 g protein (D5, AA4.25% @ 75 mL/hour (goal)    - Lipids: Check triglycerides and if <400, add lipids per below  - Lipids 20% 500mL 3x/week  - Monitor K, Phos, Mg until stable x 3 days with nutrition, replace PRN  - Hold PN at current rate if electrolytes are not stable    2.   Continue to trial PO as pt desires     3.   Continue to encourage acceptance of enteral feeds per below:   - Nepro @ 35 mL/hour  - FWF 75 mL q 2 hours        Malnutrition Assessment:  Malnutrition Status:  Insufficient data (08/07/24 1316)    Context:  Chronic Illness     Findings of the 6 clinical characteristics of malnutrition:  Energy Intake:  Mild decrease in energy intake  Weight Loss:  Mild weight loss     Body Fat Loss:  Unable to assess     Muscle Mass Loss:  Unable to assess    Fluid Accumulation:  No fluid accumulation    Grip Strength:  Not Performed     Nutrition Assessment:    43 yo female admitted for HTN emergency, N/V. Pmhx: DM, Gastroparesis, HTN, ESRD - on HD, GJ tube.      11.13: Follow up. No significant changes. Pt NPO since midnight for KUB today. Will continue to leave TF recs for if/when pt is accepting. Will also continue to leave PN recs as pt has been without adequate nutrition for greater than 1 week. Labs reviewed.     11.12: Follow Up. Pt remains appropriate for PPN however previous Attending did not favor this. Tube feeds have continued to be refused yesterday and this AM. Pt is frequently eating snack items including applesauce, ice cream and apples, however she has frequent episodes of emesis after PO intake. Has not been complying with recommendations from nursing or  myself. Refusing some aspects of care this AM. Currently OTF for scheduled MWF HD. Will continue to leave PN recs above. TF order will remain - spoke with nursing about continued encouragement for TF acceptance. Was hoping by now pt would understand TF is not what's leading to emesis. This has been explained to her many times by attending, GI, and RD. Labs reviewed, noted hyponatremia.     11.10: Follow Up. Last Friday, pt became adamant about refusing TF and only wishing to trial PO diet. This was done over the weekend and pt has continued with N/V 1-2 hours after postprandial - regardless of what's eaten. Continues to refuse TF this AM and is still unable to tolerate PO w/o N/V soon after. Pt appropriate for PN at this point. Labs reviewed. Will reach out to attending regarding PN ordering.     PPN at goal rate 75 mL/hour with lipids 3x/week provides 1038 kcal (76% needs); 77 g protein (100% needs)    11.07: Follow Up. TF ran yesterday at 35 mL/hr goal - stopped overnight after emesis. This occurred after diet was advanced to regular per pt request and a  full meal was eaten. Likely too ambitious after NPO x7 days. Given jejunum placement of TF, I doubt it alone caused emesis especially at low rate of feeds. Was likely combination of full meal + TF ISO of gastroparesis and prior period of NPO. Advocated for continuing TF at goal rate and restricting diet to full liquids. Need to take diet advancement slow to assess tolerance. No family in room bedside when I stopped by - will plan to check after noon. Will continue TF @ 35 mL/hr and monitor tolerance. Labs reviewed, lytes WNL.    11/6: Follow up. TF has not been ran since morning of 11.05. PN order placed but has not been delivered due to inadequate access. GI assessment yesterday concluded small frequent emesis is not composed of or caused by tube feeds. Will re-start TF today. Modifying order from cyclic to continuous and will attempt to meet needs with TF alone.  Per nurse, small emesis has continued this AM - clear/mucus. Will trial continuous TF today and reassess tomorrow.     Trickle feeds Nepro @ 35 mL/hour provides 1386 kcal (100% needs),123 g carbs, 62 g protein (90% needs) TF + FWF provides 1384 H2O/day.     11/5:  Follow up.  TF ran over night 11/3 as Nepro at 30 ml/hr x 10 hours which she tolerated with no complaints.  Last night we increased the rate to 40 ml/hr and she had n/v.  NPO now.  GI to see.  Spoke with daughter who states feeding was administered through J-port.  Will need to start PN until pt able to tolerate PO/TF.    PPN rec's:  goal rate of D5, 4.25% AA at 83 ml/hr + 250 ml 20% lipids MWF (TG level of 352).    This will provide 890 kcals (65% kcal needs), 85 gm protein (100% protein needs)    11/3: Follow up.  Pt remains NPO.  Have reached out to MD regarding need to start nutrition support, J-tube feeds vs PPN.  Still having N/V.  No new weight. TF rec's below in bold.      10/30:  Follow up.  Pt remains NPO.  Will recommend starting TF via J port.  Still having N/V.  No new weight.      10/28: MD consult received for TF rec's. Chart review indicate G-J tube placed in August 2025 and started on cyclic TF.  Spoke with pt's daughter at bedside.  She reports pt eats by mouth at home.  Some days she has n/v and is not able to tolerate PO so she gives herself 3 bolus feeds of NovaSource Renal 2.0 3x/day.  Pt and daughter unsure how much she gives herself, 2 large syringes full of feed.  On days when she eats and tolerates PO then she does not use feeding tube or might do one feed at night.  When I asked what foods she eats by mouth, she told me soups, cooked vegetables, and fruits.  Discussed low fiber diet necessary for gastroparesis, recommended all vegetables be cooked very soft, no beans in her soups, and fruits be canned.  Daughter states she has been losing weight, unable to quantify.  Weight hx in EMR indicates 6% loss over last 5 months which is  not significant for time frame.  Weight seems to fluctuate between 115-135 lbs over last 1.5 years.       Pt currently NPO, MD wants to hold off on starting TF.  Pt was actively vomiting during  my visit.  Did not complete NFPE because of this.      TF rec's when ready to start TF:  Nepro at 40 ml/hr x 10 hours + 1 ProSource daily + 30 ml h20 flush q4h around the clock.  This will provide 400 ml, 800 kcals (59% kcal needs), 52 gm protein (74% protein needs), 64 gm CHO, 290 + 30 + 180 = 500 ml water           Nutritionally Significant Medications:  NaCl at 100 ml/hr; Epo, Renvela , Vit B6    Estimated Daily Nutrient Needs:  Energy Requirements Based On: Kcal/kg  Weight Used for Energy Requirements: Current  Energy (kcal/day): 1365-1640 (25-30 kcals/kg)  Weight Used for Protein Requirements: Current  Protein (g/day): 70 (1.3 gm/kg)  Method Used for Fluid Requirements: 1 ml/kcal  Fluid (ml/day): 1365    Nutrition Related Findings:   Edema: None   Edema Generalized: None  RUE Edema: +1           Recent Labs     08/21/24  0335 08/22/24  0454 08/23/24  0239   GLUCOSE 263* 182* 190*   BUN 19 27* 14   CREATININE 4.44* 5.63* 4.08*   NA 137 130* 136   K 4.0 3.8 4.1   CL 95* 88* 95*   CO2 31* 28 32*   CALCIUM  8.6 8.7 8.4*   PHOS 3.1 3.3  --    MG 2.1 2.2  --      Recent Labs     08/20/24  1124 08/20/24  1618 08/20/24  2008 08/21/24  0841 08/21/24  1227 08/21/24  1900 08/21/24  1945 08/22/24  0804 08/22/24  1227 08/22/24  1629 08/22/24  1952   POCGLU 188* 219* 175* 194* 229* 187* 184* 263* 117 293* 87     Lab Results   Component Value Date/Time    LABA1C 6.1 06/27/2024 05:10 AM    LABA1C 7.0 02/19/2024 05:02 AM    LABA1C 6.8 10/24/2023 03:52 AM    EAG 127 06/27/2024 05:10 AM     Triglycerides   Date Value Ref Range Status   08/14/2024 352 (H) 0 - 150 MG/DL Final     Comment:     Borderline High: 150-199 mg/dL, High: 799-500 mg/dL  Very High: Greater than or equal to 500 mg/dL     89/71/7974 828 (H) 0 - 150 MG/DL Final     Comment:      Borderline High: 150-199 mg/dL, High: 799-500 mg/dL  Very High: Greater than or equal to 500 mg/dL     98/98/7974 844 (H) <150 MG/DL Final     Comment:     Based on NCEP-ATP III:  Triglycerides <150 mg/dL  is considered normal, 150-199 mg/dL  borderline high,  799-500 mg/dL high and  greater than or equal to 500 mg/dL very high.       Last BM: 08/11/24    Wounds: Wound Type: None    Current Nutrition Therapies:  Diet: NPO  Supplements: none  Meal Intake:   No data found.  Supplement Intake:  No data found.  Nutrition Support: none    Anthropometric Measures:  Height: 149.9 cm (4' 11.02)  Ideal Body Weight (IBW): 95 lbs (43 kg)       Current Body Weight: 54.6 kg (120 lb 5.9 oz), 126.7 % IBW. Weight Source: Bed scale  Current BMI (kg/m2): 24.3        Weight Adjustment For: No Adjustment  BMI Categories: Normal Weight (BMI 18.5-24.9)    Wt Readings from Last 10 Encounters:   08/06/24 54.6 kg (120 lb 5.9 oz)   07/20/24 57.2 kg (126 lb 1.7 oz)   07/11/24 54.6 kg (120 lb 5.9 oz)   07/09/24 55.5 kg (122 lb 5.7 oz)   03/06/24 57.6 kg (127 lb)   02/21/24 58 kg (127 lb 13.9 oz)   11/28/23 52.2 kg (115 lb 1.3 oz)   10/31/23 52.6 kg (115 lb 15.4 oz)   10/21/23 55.2 kg (121 lb 11.1 oz)   10/12/23 60.4 kg (133 lb 3.2 oz)       Nutrition Diagnosis:   Inadequate protein-energy intake related to nausea/vomiting/diarrhea as evidenced by poor intake prior to admission    Nutrition Interventions:   Food and/or Nutrient Delivery: Continue Current Diet, Continue Current Tube Feeding  Nutrition Education/Counseling: Education/Counseling initiated  Coordination of Nutrition Care: Continue to monitor while inpatient       Goals:  Previous Goal Met: Goal(s) Achieved  Goals: Tolerate nutrition support at goal rate, by next RD assessment       Nutrition Monitoring and Evaluation:   Behavioral-Environmental Outcomes: None Identified  Food/Nutrient Intake Outcomes: Diet Advancement/Tolerance, Food and Nutrient Intake, Enteral  Nutrition Intake/Tolerance  Physical Signs/Symptoms Outcomes: Biochemical Data, Nausea or Vomiting, Weight    Discharge Planning:    Too soon to determine     Victory Ranger, MS, RD  Available via [employer]    "

## 2024-08-23 NOTE — Consults (Signed)
 Session ID: [id number]  Session Duration: 2 minutes  Language: Spanish  Interpreter ID: #[id number]  Interpreter Name: Delon

## 2024-08-23 NOTE — Progress Notes (Signed)
 "    Hospitalist Progress Note      NAME:  Melinda [name]   DOB:  1981/01/11  MRM:  [id number]    Date/Time: 08/23/2024  6:48 AM           Assessment / Plan:     43 year old female with ESRD, type 2 diabetes, hypertension, admitted 08/06/24 with acute on chronic vomiting and abdominal pain likely secondary to gastroparesis.     Gastroparesis with emesis abdominal pain s/p GJ tube  POA, acute on chronic. Reason for admission. Patient has chronic intractable N/V which lead to GJ placement, but sxs have continued. Normal abdominal imaging. GJ with correct placement. Evaluated by GI, sxs will be difficult to treat & PO intake will lead to continued N/V. It has been explained to patient that GJ is past ligament of treitz, GJ feeds are not the cause of her sxs, and feeds will provide nutrition even in setting of emesis. Recommend continued tube feeds; however patient has been noncompliant with feeds & NPO status   - KUB (11/12) with proper GJ positioning.   - May ultimately need Hohn placement for long term TPN if remains noncompliant with TF. Certainly comes with risk given her bacteremia, but if no progress is made with TF the nutritional benefits > the risks.   - Diet: Strict NPO. Maintain nutrition through tube feeds as possible. Dietician following   - PPI, Pepcid , Bowel regimen, scopolamine  patch   - Reglan  and zofran      Sepsis secondary to MSSA bacteremia  Evaluated by ID. Low suspicion however cannot rule out AV graft infected hematoma.  Vascular surgery followed care, no intervention planned   - Per ID, cefazolin  2 g on HD days through 09/19/2024 per ID     Alpha Streptococcus isolated from urine   Evaluated by ID. Likely colonization   - Abx as above    Hypertension POA  Elevated BPs  POA, chronic.   - Continue home Coreg  and hydralazine   - Nifedipine  discontinued (cannot be crushed). Now on clonidine  patch     Prolonged QT  Qtc 503.   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 Phosphorus now normal  - Continue sevelamer       Fentanyl  positive  Seen on drug screen. May be a false positive in the setting of Benadryl  use. Patient adamantly denies use.     ESRD  On HD  - Nephrology following     GERD  Chronic.   - Continue Pepcid  and PPI     T2DM with gastroparesis.   POA, chronic.  Last A1c of 6.1 on 9/17.    - Sliding scale insulin .      Chronic Back pain   No e/o discitis, noted DDD and Moderate to severe canal stenosis at C4-5 and moderate canal stenosis at C3-C4   - Duloxetine    - Pain control PRN    Anxiety/depression  Adjustment disorder in setting of medical issues/prolonged stay  There was concern about her flat affect, not caring for self (urinating on self, refusing to be cleaned), noncompliance with medical plan. Evaluated by psychiatry (11/7), their assessment was not consistent with self-induced vomiting, eating disorder, or psychotic process   - Duloxetine  and BuSpar .  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   - Continue plavix  and ASA  - Continue statin      Anemia  Mild and intermittent during hospital stay from 9 - 12. In setting of acute illness and ESRD  - Stable   - ESA per nephrology       #BMI (Calculated): 24.3    I have personally reviewed the radiographs, laboratory data in Epic and decisions and statements above are based partially on this personal interpretation.                 Care Plan discussed with: Patient, Family, Care Manager, Nursing Staff, and Consultant/Specialist    Discussed:  Care Plan and D/C Planning    Prophylaxis:  Hep SQ    Disposition:  Home w/Family    Total time spent with patient care: 30 Minutes **I personally saw and examined the patient during this time period**           ___________________________________________________    Attending  Physician: Thena cross, MD        Subjective:     Chief Complaint:  Patient continues to have abdominal pain, N/V. She had honey dew melon and pineapple at bedside. She is vomiting up chunks of this fruit. Nursing states she was noncompliant with NPO status and thus unable to get Hohn today.     Discussed course and plan with patient. Explained my concerns about her lack of nutrition. Explained my concerns about PO intake and the sxs it causing. Explained that she needs to remain strict NPO and trial tube feeds. Discussed potential harms/organ failure that could occur if she does not get nutrition & that it cannot be done by  mouth as she has failed all PO trials.     Working to get [employer] catheter, anticipate will need considerations for PPN/TPN, pending this TF trial. Order for Hohn is placed. Patient made aware she needs to remain NPO for this     Patient expressed understanding and agreement to NPO with TF trial today.     ROS:  12+ ROS reviewed with patient and negative with exception of above           Objective:       Vitals:          Last 24hrs VS reviewed since prior progress note. Most recent are:    Vitals:    08/23/24 0608   BP:    Pulse:    Resp: 20   Temp:    SpO2:      SpO2 Readings from Last 6 Encounters:   08/22/24 98%   07/20/24 94%   07/11/24 98%   07/09/24 98%   07/05/24 95%   07/02/24 95%          Intake/Output Summary (Last 24 hours) at 08/23/2024 9351  Last data filed at 08/22/2024 1217  Gross per 24 hour   Intake 500 ml   Output 500 ml   Net 0 ml          Exam:     Physical Exam:     Physical Exam  Constitutional:       General: She is not in acute distress.     Appearance: She is not ill-appearing, toxic-appearing or diaphoretic.      Comments: Disheveled. Malodorous. Actively vomiting during interview    HENT:      Head: Normocephalic and atraumatic.      Right Ear: External ear  normal.      Left Ear: External ear normal.      Nose: Nose normal.      Mouth/Throat:      Pharynx: Oropharynx is  clear.   Eyes:      Extraocular Movements: Extraocular movements intact.      Conjunctiva/sclera: Conjunctivae normal.   Cardiovascular:      Rate and Rhythm: Normal rate.   Pulmonary:      Effort: Pulmonary effort is normal.   Abdominal:      General: Abdomen is flat. There is no distension.      Palpations: Abdomen is soft.      Tenderness: There is abdominal tenderness (generalized).      Comments: GJ tube normal   Musculoskeletal:         General: No deformity.   Skin:     General: Skin is warm and dry.   Neurological:      General: No focal deficit present.   Psychiatric:         Mood and Affect: Mood normal.      Comments: Flat affect.               Medications Reviewed: (see below)    Lab Data Reviewed: (see below)    ______________________________________________________________________    Medications:     Current Facility-Administered Medications   Medication Dose Route Frequency    cloNIDine  (CATAPRES ) 0.2 MG/24HR 1 patch  1 patch TransDERmal Weekly    ondansetron  (ZOFRAN ) injection 4 mg  4 mg IntraMUSCular Q6H    lansoprazole  (PREVACID  SOLUTAB) disintegrating tablet 30 mg  30 mg Per G Tube BID    metoclopramide  (REGLAN ) 5 MG/5ML solution 5 mg  5 mg Per G Tube TID AC    prochlorperazine  (COMPAZINE ) suppository 25 mg  25 mg Rectal Q12H PRN    HYDROmorphone  HCl PF (DILAUDID ) injection 0.5 mg  0.5 mg IntraMUSCular Q4H PRN    [Held by provider] prochlorperazine  (COMPAZINE ) injection 10 mg  10 mg IntraVENous Q6H    [Held by provider] ondansetron  (ZOFRAN ) injection 4 mg  4 mg IntraVENous Q6H    sodium chloride  flush 0.9 % injection 5-40 mL  5-40 mL IntraVENous 2 times per day    sodium chloride  flush 0.9 % injection 5-40 mL  5-40 mL IntraVENous PRN    0.9 % sodium chloride  infusion   IntraVENous PRN    lidocaine  PF 1 % injection 50 mg  50 mg IntraDERmal Once    dextrose  bolus 10% 125 mL  125 mL IntraVENous PRN    Or    dextrose  bolus 10% 250 mL  250 mL IntraVENous PRN    glucagon  injection 1 mg  1 mg  SubCUTAneous PRN    dextrose  10 % infusion   IntraVENous Continuous PRN    hydrALAZINE  (APRESOLINE ) tablet 100 mg  100 mg Per G Tube TID    glucose chewable tablet 16 g  4 tablet Per G Tube PRN    [Held by provider] HYDROmorphone  HCl PF (DILAUDID ) injection 0.5 mg  0.5 mg IntraVENous Q4H PRN    Or    [Held by provider] HYDROmorphone  HCl PF (DILAUDID ) injection 1 mg  1 mg IntraVENous Q4H PRN    labetalol  (NORMODYNE ;TRANDATE ) injection 10 mg  10 mg IntraVENous Q4H PRN    acetaminophen  (TYLENOL ) tablet 1,000 mg  1,000 mg PEG Tube 4 times per day    dicyclomine  (BENTYL ) capsule 10 mg  10 mg PEG Tube TID AC  polyethylene glycol (GLYCOLAX ) packet 17 g  17 g Per G Tube Daily PRN    sevelamer  (RENVELA ) packet 0.8 g  0.8 g Per G Tube TID WC    sodium bicarbonate  tablet 650 mg  650 mg PEG Tube TID    oxyCODONE  (ROXICODONE  INTENSOL) 100 MG/5ML concentrated solution 5 mg  5 mg Per G Tube Q4H PRN    bisacodyl  (DULCOLAX) suppository 10 mg  10 mg Rectal Daily PRN    [Held by provider] epoetin  alfa-epbx (RETACRIT ) injection 10,000 Units  10,000 Units SubCUTAneous Q MWF    aspirin  chewable tablet 81 mg  81 mg Per G Tube Daily    atorvastatin  (LIPITOR ) tablet 80 mg  80 mg Per G Tube QHS    busPIRone  (BUSPAR ) tablet 5 mg  5 mg Per G Tube BID    clopidogrel  (PLAVIX ) tablet 75 mg  75 mg Per G Tube Daily    DULoxetine  (CYMBALTA ) extended release capsule 60 mg  60 mg Per G Tube Daily    famotidine  (PEPCID ) tablet 20 mg  20 mg Per G Tube Daily    sennosides-docusate sodium  (SENOKOT-S) 8.6-50 MG tablet 1 tablet  1 tablet Per G Tube BID    vitamin B-6 (PYRIDOXINE ) tablet 25 mg  25 mg Per G Tube Q6H    ceFAZolin  (ANCEF ) 1,000 mg in sterile water  10 mL IV syringe  1,000 mg IntraVENous Daily    0.9 % sodium chloride  infusion   IntraVENous PRN    carvedilol  (COREG ) tablet 25 mg  25 mg Per G Tube BID WC    scopolamine  (TRANSDERM-SCOP) transdermal patch 1 patch  1 patch TransDERmal Q72H    albumin  human 25% IV solution 25 g  25 g IntraVENous PRN     0.9 % sodium chloride  infusion   IntraVENous PRN    heparin  (porcine) injection 5,000 Units  5,000 Units SubCUTAneous 3 times per day    insulin  lispro (HUMALOG ,ADMELOG ) injection vial 0-8 Units  0-8 Units SubCUTAneous 4x Daily AC & HS            Lab Review:     Recent Labs     08/21/24  0335 08/22/24  0454 08/23/24  0239   WBC 8.8 8.0 6.9   HGB 12.7 11.6 11.2*   HCT 39.3 36.8 35.7   PLT 481* 456* 425*     Recent Labs     08/21/24  0335 08/22/24  0454 08/23/24  0239   NA 137 130* 136   K 4.0 3.8 4.1   CL 95* 88* 95*   CO2 31* 28 32*   BUN 19 27* 14   MG 2.1 2.2  --    PHOS 3.1 3.3  --    ALT <5* <5* <5*     No components found for: GLPOC        "

## 2024-08-23 NOTE — Progress Notes (Signed)
 "                            Melinda [name]. [facility] Toto-Ambros  Date of Birth: 01-Feb-1981          Assessment & Plan:     ESRD on HD MWF via LAVG, [facility], f/b Dr. Kalvin  Hypertensive urgency-better after HD  DM  AOCD  Chronic abd pain, n/v from gastroparesis-reason for admit  Staph bacteremia cxs now negative     Plan:  HD tomorrow and MWF  Decreased UF to 1L; Bps drop quickly on HD. Intermittent po intake and has been refusing her tube feeds  Agree with stopping nifedipine  as can't be crushed for J tube and adding clonidine  patch  ESA MWF  Needs cefazolin  2/2/3 post HD until 09/19/24-spoke with her clinic and they are aware  If she needs IV access-please place Hohn. We need to preserve her arm veins for dialysis       Subjective:   CC: ESRD  HPI: Nausea. Family at the bedside. Ate food brought in by family.     Current Facility-Administered Medications   Medication Dose Route Frequency    cloNIDine  (CATAPRES ) 0.2 MG/24HR 1 patch  1 patch TransDERmal Weekly    ondansetron  (ZOFRAN ) injection 4 mg  4 mg IntraMUSCular Q6H    lansoprazole  (PREVACID  SOLUTAB) disintegrating tablet 30 mg  30 mg Per G Tube BID    metoclopramide  (REGLAN ) 5 MG/5ML solution 5 mg  5 mg Per G Tube TID AC    prochlorperazine  (COMPAZINE ) suppository 25 mg  25 mg Rectal Q12H PRN    HYDROmorphone  HCl PF (DILAUDID ) injection 0.5 mg  0.5 mg IntraMUSCular Q4H PRN    [Held by provider] prochlorperazine  (COMPAZINE ) injection 10 mg  10 mg IntraVENous Q6H    [Held by provider] ondansetron  (ZOFRAN ) injection 4 mg  4 mg IntraVENous Q6H    sodium chloride  flush 0.9 % injection 5-40 mL  5-40 mL IntraVENous 2 times per day    sodium chloride  flush 0.9 % injection 5-40 mL  5-40 mL IntraVENous PRN    0.9 % sodium chloride  infusion   IntraVENous PRN    lidocaine  PF 1 % injection 50 mg  50 mg IntraDERmal Once    dextrose  bolus 10% 125 mL  125 mL IntraVENous PRN    Or    dextrose  bolus 10% 250 mL  250 mL IntraVENous PRN     glucagon  injection 1 mg  1 mg SubCUTAneous PRN    dextrose  10 % infusion   IntraVENous Continuous PRN    hydrALAZINE  (APRESOLINE ) tablet 100 mg  100 mg Per G Tube TID    glucose chewable tablet 16 g  4 tablet Per G Tube PRN    [Held by provider] HYDROmorphone  HCl PF (DILAUDID ) injection 0.5 mg  0.5 mg IntraVENous Q4H PRN    Or    [Held by provider] HYDROmorphone  HCl PF (DILAUDID ) injection 1 mg  1 mg IntraVENous Q4H PRN    labetalol  (NORMODYNE ;TRANDATE ) injection 10 mg  10 mg IntraVENous Q4H PRN    acetaminophen  (TYLENOL ) tablet 1,000 mg  1,000 mg PEG Tube 4 times per day    dicyclomine  (BENTYL ) capsule 10 mg  10 mg PEG Tube TID AC    polyethylene glycol (GLYCOLAX ) packet 17 g  17 g Per G Tube Daily PRN    sevelamer  (RENVELA ) packet 0.8 g  0.8  g Per G Tube TID WC    sodium bicarbonate  tablet 650 mg  650 mg PEG Tube TID    oxyCODONE  (ROXICODONE  INTENSOL) 100 MG/5ML concentrated solution 5 mg  5 mg Per G Tube Q4H PRN    bisacodyl  (DULCOLAX) suppository 10 mg  10 mg Rectal Daily PRN    [Held by provider] epoetin  alfa-epbx (RETACRIT ) injection 10,000 Units  10,000 Units SubCUTAneous Q MWF    aspirin  chewable tablet 81 mg  81 mg Per G Tube Daily    atorvastatin  (LIPITOR ) tablet 80 mg  80 mg Per G Tube QHS    busPIRone  (BUSPAR ) tablet 5 mg  5 mg Per G Tube BID    clopidogrel  (PLAVIX ) tablet 75 mg  75 mg Per G Tube Daily    DULoxetine  (CYMBALTA ) extended release capsule 60 mg  60 mg Per G Tube Daily    famotidine  (PEPCID ) tablet 20 mg  20 mg Per G Tube Daily    sennosides-docusate sodium  (SENOKOT-S) 8.6-50 MG tablet 1 tablet  1 tablet Per G Tube BID    vitamin B-6 (PYRIDOXINE ) tablet 25 mg  25 mg Per G Tube Q6H    ceFAZolin  (ANCEF ) 1,000 mg in sterile water  10 mL IV syringe  1,000 mg IntraVENous Daily    0.9 % sodium chloride  infusion   IntraVENous PRN    carvedilol  (COREG ) tablet 25 mg  25 mg Per G Tube BID WC    scopolamine  (TRANSDERM-SCOP) transdermal patch 1 patch  1 patch TransDERmal Q72H    albumin  human 25% IV  solution 25 g  25 g IntraVENous PRN    0.9 % sodium chloride  infusion   IntraVENous PRN    heparin  (porcine) injection 5,000 Units  5,000 Units SubCUTAneous 3 times per day    insulin  lispro (HUMALOG ,ADMELOG ) injection vial 0-8 Units  0-8 Units SubCUTAneous 4x Daily AC & HS          Objective:     Vitals:  Blood pressure (!) 143/90, pulse 96, temperature 97.5 F (36.4 C), temperature source Axillary, resp. rate 15, height 1.499 m (4' 11.02), weight 54.6 kg (120 lb 5.9 oz), SpO2 97%.  Temp (24hrs), Avg:98 F (36.7 C), Min:97.5 F (36.4 C), Max:98.4 F (36.9 C)      Intake and Output:  No intake/output data recorded.  11/11 1901 - 11/13 0700  In: 500   Out: 500     Physical Exam:               GENERAL ASSESSMENT: uncomfortable, blankets, sheets soiled  HEENT: Nontraumatic   EXTREMITY: no EDEMA; LUE AVG NT, no swelling/warmth, good t/b  NEURO: Grossly non focal          ECG/rhythm:    Data Review        Recent Labs     08/21/24  0335 08/22/24  0454 08/23/24  0239   NA 137 130* 136   K 4.0 3.8 4.1   CL 95* 88* 95*   CO2 31* 28 32*   BUN 19 27* 14   CREATININE 4.44* 5.63* 4.08*   GLUCOSE 263* 182* 190*   CALCIUM  8.6 8.7 8.4*       : Damien FORBES Rubin DEVONNA  08/23/2024        [facility]:  [email or web address]  [email or web address]    [place] office:  [name] [place], [address]  [place], [place] [address]  Phone: [phone]  Fax :     [phone]    [place] office:  [name]  [place],   [address]  Phone - [phone]  Fax - [phone]                                                                                        "

## 2024-08-23 NOTE — Consults (Signed)
 Session ID: [id number]  Session Duration: 6 minutes  Language: Spanish  Interpreter ID: [id number]  Interpreter Name: Ofilia

## 2024-08-23 NOTE — Progress Notes (Signed)
"  Pt is NPO. Has not been following orders. Has gum and mints. Emesis volume over clearly not following parameters. Has been spoken to by doctors and nurses about this delaying progress and care.   "

## 2024-08-24 ENCOUNTER — Inpatient Hospital Stay: Payer: PRIVATE HEALTH INSURANCE

## 2024-08-24 ENCOUNTER — Inpatient Hospital Stay: Admit: 2024-08-25 | Payer: PRIVATE HEALTH INSURANCE

## 2024-08-24 LAB — EKG 12-LEAD
Atrial Rate: 112 {beats}/min
P Axis: 53 degrees
P-R Interval: 132 ms
Q-T Interval: 358 ms
QRS Duration: 90 ms
QTc Calculation (Bazett): 488 ms
R Axis: 266 degrees
T Axis: 91 degrees
Ventricular Rate: 112 {beats}/min

## 2024-08-24 LAB — CBC
Hematocrit: 36.7 % (ref 35.0–47.0)
Hemoglobin: 11.7 g/dL (ref 11.5–16.0)
MCH: 30.6 pg (ref 26.0–34.0)
MCHC: 31.9 g/dL (ref 30.0–36.5)
MCV: 96.1 FL (ref 80.0–99.0)
MPV: 9 FL (ref 8.9–12.9)
Nucleated RBCs: 0 /100{WBCs}
Platelets: 451 K/uL — ABNORMAL HIGH (ref 150–400)
RBC: 3.82 M/uL (ref 3.80–5.20)
RDW: 16.7 % — ABNORMAL HIGH (ref 11.5–14.5)
WBC: 6.1 K/uL (ref 3.6–11.0)
nRBC: 0 K/uL (ref 0.00–0.01)

## 2024-08-24 LAB — BASIC METABOLIC PANEL
Anion Gap: 13 mmol/L (ref 2–14)
BUN/Creatinine Ratio: 5 — ABNORMAL LOW (ref 12–20)
BUN: 31 mg/dL — ABNORMAL HIGH (ref 6–20)
CO2: 30 mmol/L — ABNORMAL HIGH (ref 20–29)
Calcium: 8.5 mg/dL — ABNORMAL LOW (ref 8.6–10.0)
Chloride: 91 mmol/L — ABNORMAL LOW (ref 98–107)
Creatinine: 6.13 mg/dL — ABNORMAL HIGH (ref 0.60–1.00)
Est, Glom Filt Rate: 8 ml/min/1.73m2 — ABNORMAL LOW (ref >59)
Glucose: 155 mg/dL — ABNORMAL HIGH (ref 65–100)
Potassium: 3.9 mmol/L (ref 3.5–5.1)
Sodium: 133 mmol/L — ABNORMAL LOW (ref 136–145)

## 2024-08-24 LAB — MAGNESIUM: Magnesium: 2.3 mg/dL (ref 1.6–2.6)

## 2024-08-24 LAB — POCT GLUCOSE
POC Glucose: 242 mg/dL — ABNORMAL HIGH (ref 65–117)
POC Glucose: 205 mg/dL — ABNORMAL HIGH (ref 65–117)
POC Glucose: 124 mg/dL — ABNORMAL HIGH (ref 65–117)
POC Glucose: 482 mg/dL — ABNORMAL HIGH (ref 65–117)
POC Glucose: 346 mg/dL — ABNORMAL HIGH (ref 65–117)

## 2024-08-24 LAB — PHOSPHORUS: Phosphorus: 4.3 mg/dL (ref 2.5–4.5)

## 2024-08-24 MED ORDER — OXYCODONE HCL 100 MG/5ML PO CONC
100 | Freq: Two times a day (BID) | ORAL | Status: DC | PRN
Start: 2024-08-24 — End: 2024-08-24

## 2024-08-24 MED ORDER — INSULIN GLARGINE 100 UNIT/ML SC SOLN
100 | Freq: Every evening | SUBCUTANEOUS | Status: DC
Start: 2024-08-24 — End: 2024-08-28
  Administered 2024-08-25 – 2024-08-28 (×4): 10 [IU] via SUBCUTANEOUS

## 2024-08-24 MED ORDER — INSULIN LISPRO 100 UNIT/ML IJ SOLN
100 | Freq: Four times a day (QID) | INTRAMUSCULAR | Status: DC
Start: 2024-08-24 — End: 2024-08-28
  Administered 2024-08-24: 22:00:00 8 [IU] via SUBCUTANEOUS
  Administered 2024-08-25 (×2): 2 [IU] via SUBCUTANEOUS
  Administered 2024-08-26 (×2): 4 [IU] via SUBCUTANEOUS
  Administered 2024-08-26 (×2): 2 [IU] via SUBCUTANEOUS
  Administered 2024-08-27: 23:00:00 8 [IU] via SUBCUTANEOUS
  Administered 2024-08-27 – 2024-08-28 (×3): 2 [IU] via SUBCUTANEOUS

## 2024-08-24 MED ORDER — ACETAMINOPHEN 500 MG PO TABS
500 | Freq: Four times a day (QID) | ORAL | Status: DC
Start: 2024-08-24 — End: 2024-08-28
  Administered 2024-08-24 – 2024-08-27 (×9): 1000 mg via GASTROSTOMY

## 2024-08-24 MED ORDER — ACETAMINOPHEN 500 MG PO TABS
500 | ORAL | Status: DC | PRN
Start: 2024-08-24 — End: 2024-08-24

## 2024-08-24 MED ORDER — TRAMADOL HCL 50 MG PO TABS
50 | Freq: Two times a day (BID) | ORAL | Status: DC | PRN
Start: 2024-08-24 — End: 2024-08-28
  Administered 2024-08-25: 03:00:00 50 mg via GASTROSTOMY

## 2024-08-24 MED FILL — METOCLOPRAMIDE HCL 10 MG/10ML PO SOLN: 10 mg/mL | ORAL | Qty: 10 | Fill #0

## 2024-08-24 MED FILL — CARVEDILOL 12.5 MG PO TABS: 12.5 mg | ORAL | Qty: 2 | Fill #0

## 2024-08-24 MED FILL — ATORVASTATIN CALCIUM 20 MG PO TABS: 20 mg | ORAL | Qty: 4 | Fill #0

## 2024-08-24 MED FILL — INSULIN LISPRO 100 UNIT/ML IJ SOLN: 100 [IU]/mL | INTRAMUSCULAR | Qty: 2 | Fill #0

## 2024-08-24 MED FILL — ASPIRIN 81 MG PO CHEW: 81 mg | ORAL | Qty: 1 | Fill #0

## 2024-08-24 MED FILL — INSULIN LISPRO 100 UNIT/ML IJ SOLN: 100 [IU]/mL | INTRAMUSCULAR | Qty: 8 | Fill #0

## 2024-08-24 MED FILL — ONDANSETRON HCL 4 MG/2ML IJ SOLN: 4 MG/2ML | INTRAMUSCULAR | Qty: 2 | Fill #0

## 2024-08-24 MED FILL — ACETAMINOPHEN EXTRA STRENGTH 500 MG PO TABS: 500 mg | ORAL | Qty: 2 | Fill #0

## 2024-08-24 MED FILL — SODIUM BICARBONATE 650 MG PO TABS: 650 mg | ORAL | Qty: 1 | Fill #0

## 2024-08-24 MED FILL — CLOPIDOGREL BISULFATE 75 MG PO TABS: 75 mg | ORAL | Qty: 1 | Fill #0

## 2024-08-24 MED FILL — SENNOSIDES-DOCUSATE SODIUM 8.6-50 MG PO TABS: 8.6-50 mg | ORAL | Qty: 1 | Fill #0

## 2024-08-24 MED FILL — VITAMIN B-6 50 MG PO TABS: 50 mg | ORAL | Qty: 1 | Fill #0

## 2024-08-24 MED FILL — OXYCODONE HCL 10 MG/0.5ML PO CONC: 10 MG/0.5ML | ORAL | Qty: 0.5 | Fill #0

## 2024-08-24 MED FILL — HEPARIN SODIUM (PORCINE) 5000 UNIT/ML IJ SOLN: 5000 [IU]/mL | INTRAMUSCULAR | Qty: 1 | Fill #0

## 2024-08-24 MED FILL — LANSOPRAZOLE 15 MG PO TBDD: 15 mg | ORAL | Qty: 2 | Fill #0

## 2024-08-24 MED FILL — HYDRALAZINE HCL 25 MG PO TABS: 25 mg | ORAL | Qty: 4 | Fill #0

## 2024-08-24 MED FILL — FAMOTIDINE 20 MG PO TABS: 20 mg | ORAL | Qty: 1 | Fill #0

## 2024-08-24 MED FILL — DULCOLAX 10 MG RE SUPP: 10 mg | RECTAL | Qty: 1 | Fill #0

## 2024-08-24 MED FILL — SEVELAMER CARBONATE 0.8 G PO PACK: 0.8 g | ORAL | Qty: 1 | Fill #0

## 2024-08-24 MED FILL — CEFAZOLIN SODIUM 1 G IJ SOLR: 1 g | INTRAMUSCULAR | Qty: 1000 | Fill #0

## 2024-08-24 MED FILL — DULOXETINE HCL 30 MG PO CPEP: 30 mg | ORAL | Qty: 2 | Fill #0

## 2024-08-24 MED FILL — BUSPIRONE HCL 10 MG PO TABS: 10 mg | ORAL | Qty: 1 | Fill #0

## 2024-08-24 NOTE — Dialysis (Signed)
"  Hep B verification performed for (RN): Wanda Packer, RN  Hep B results, dates, and Primary Source: NEG/SUSC 08/07/2024, Epic  Hepatitis B Surface Ag   Date/Time Value Ref Range Status   08/07/2024 01:38 AM NONREACTIVE NR   Final     Hep B S Ag Interp   Date/Time Value Ref Range Status   03/16/2023 08:19 AM Negative NEG   Final     Hep B S Ab   Date/Time Value Ref Range Status   08/07/2024 01:38 AM <3.50 mIU/mL Final     Comment:        <10.00 mIU/mL     Non-Immune  =>10.00 mIU/mL    Individual is considered to be immune to infection with HBV       Machine disinfect process: Standard heat  "

## 2024-08-24 NOTE — Progress Notes (Signed)
"  Code Fall called at 1937 followed by a RRT Called at 1944 (426/01).    Responded to RRT at 1950 2/2 pt becoming dizzy after using the restroom and falling.     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 Pt states she was in the bathroom and became dizzy and she fell on the floor but was able to keep her head up. Pt denies hitting her head or having any pain other than in her stomach, which is her reason for admission. Pt denies LOC, CP, and SOB. Per the primary RN and VSC, the pt did hit her head and was found on the floor grabbing the back of her head. STAT head CT ordered. No visible injury was noted.     Pt was hypotensive and NP ordered albumin . BG was 146. Pt was a RRT earlier today for tachycardia and EKG done. Repeat EKG and labs ordered.     Pt was hypotensive and an HD pt. BP was 76/42 MAP 54. Repeat BP was 74/43 MAP 53. NP ordered albumin . Pt's BP did not fully respond well to the albumin  (87/44 MAP 58, 73/45 MAP 54) and the pt remains symptomatic, so the pt will be transferred to a stepdown bed for possible phenylephrine  gtt.      RRT verbally is released by primary RN.     Rapid Ended at 2100  RRT RN assisted with transport to accepting unit No.    Julee Brodie, RN    Pt transported to 471 and arrived at 2145.    "

## 2024-08-24 NOTE — Consults (Signed)
 Session ID: [id number]  Session Duration: 10 minutes  Language: Spanish  Interpreter ID: #[id number]  Interpreter Name: Darral

## 2024-08-24 NOTE — Consults (Signed)
 Session ID: [id number]  Session Duration: 6 minutes  Language: Spanish  Interpreter ID: #[id number]  Interpreter Name: Nena

## 2024-08-24 NOTE — Progress Notes (Addendum)
"                                                                   POST FALL MANAGEMENT    Melinda [name]  MEDICAL RECORD NUMBER:  [id number]  AGE: 43 y.o.   GENDER: female  DOB: 1981-06-24  TODAYS DATE:  08/24/2024    Details     Fall Occurred: Yes    Was the Fall Witnessed:  No      Brief Review of Event:  1930: Pts Animator companion alarming. This RN walked into room and found pt on the ground on back. Pt was grabbing back of head but no broken skin or injury noted.     1938: Notified NP (J.Kashurba) of fall and vitals. Pt BP 76/42 and dizzy. Code fall initiated    1940:RRT called per APN request. Pt stated she was walking to bathroom and fell.Language line used to communicate with pt. VSC stated pt did hit head , pt said she did not hit her head.             Who found the patient: Clent Collier RN      Where was the patient at the time of the fall: I her room 426 by the door       Patient Comments: I fall       Date Fall Occurred:  August 24, 2024       Time Fall Occurred: 1930     Assessment     Post Fall Head to Toe Assessment Completed: Yes    Post Fall Predictive Analytic Score Reviewed: Yes     Post Fall Vitals Completed: Yes    Post Fall Neuro Checks Completed: Yes    Injury Occurred(if yes, describe injury):  no injury           Did the Patient Experience:(Check Oneil all that apply)    [x]  Patient hit head  []  Loss of consciousness  []  Change in mental status following the fall  []  Patient is on an anticoagulant medication      CT Performed:  no    Follow-up     Persons Notified of Fall:  (Provide names of persons notified)   []  Physician:   [x]  APP:  [x]  Nursing Supervisior:  [x]  Manager:  []  Pharmacist:  [x]  Family:  [x]  Other: Programmer, Applications by Clent Collier, RN 08/24/2024 at 9:04 PM           "

## 2024-08-24 NOTE — Flowsheet Note (Signed)
 "Primary RN SBAR: Dorthea Moose, RN  Incapacitated Nurse edu. provided: yes, 2nd RN in suite  Patient Education provided: need for HD prior to procedure to extend NPO time; procedure orders and goals  Preferred Education method and Primary language: verbal/ Spanish and English  Dialysis consent: chronic consent applies  Hospital General Consent Verified: yes (Spanish/ English version verified)  Hospital associated wait time; reason: to suite; back to room  Hepatitis B Surface Ag   Date/Time Value Ref Range Status   08/07/2024 01:38 AM NONREACTIVE NR   Final     Hep B S Ag Interp   Date/Time Value Ref Range Status   03/16/2023 08:19 AM Negative NEG   Final     Hep B S Ab   Date/Time Value Ref Range Status   08/07/2024 01:38 AM <3.50 mIU/mL Final     Comment:        <10.00 mIU/mL     Non-Immune  =>10.00 mIU/mL    Individual is considered to be immune to infection with HBV       Hep B results, dates, and Primary Source: Neg/ Susc 08/07/24 - EPIC  Hep B dual verification performed by (RN): MARLA Kindred, RN  Machine disinfect process:Standard - Heat    Pre-HD    08/24/24 0830   Observations & Evaluations   Level of Consciousness 0   Oriented X 4   Heart Rhythm Regular   Respiratory Quality/Effort Unlabored   O2 Device None (Room air)   Bilateral Breath Sounds Clear;Diminished   Skin Condition/Temp Warm;Dry   Appetite Vomiting  (Nausea/ vomiting/ NPO for procedure)   Edema None   Vital Signs   BP 137/64   Temp 98.6 F (37 C)   Pulse 93   Respirations 18   SpO2 99 %   Pain Assessment   Pain Assessment 0-10   Pain Level 5  (abd; with N/V)   Technical Checks   Dialysis Machine No. [id number]   RO Machine Number [id number]   Dialyzer Lot No. [id number]   Tubing Lot Number [id number]   All Connections Secure Yes   NS Bag Yes   Saline Line Double Clamped Yes   Dialyzer F-180   Prime Volume (mL) 200 mL   ICEBOAT I;C;E;B;O;A;T   RO Machine Log Sheet Completed Yes   Machine Alarm Self Test Completed;Geneticist, Molecular 14.1   Machine Ph 7.4   Bleach Test (Neg) Yes   Bath Temperature 96.8 F (36 C)   Dialysis Bath   K+ (Potassium) 3   Ca+ (Calcium ) 2.5   Na+ (Sodium) 138   HCO3 (Bicarb) 38     Start of HD     08/24/24 0840   Treatment   Time On 0840   Treatment Goal 0ml in 3hr   Vital Signs   BP 130/67   Temp 98.6 F (37 C)   Pulse 96   Respirations 18   SpO2 99 %   Treatment Initiation   Dialyze Hours 3   Treatment  Initiation Universal Precautions maintained;Lines secured to patient;Connections secured;Prime given;Venous Parameters set;Arterial Parameters set;It consultant engaged;Saline line double clamped   During Hemodialysis Assessment   Blood Flow Rate (ml/min) 350 ml/min   Arterial Pressure (mmHg) -140 mmHg   Venous Pressure (mmHg) 150   TMP 40   DFR 600   Comments Tx initiated (after short wait for N/V  to subside)   Access Visible Yes   Ultrafiltration Rate (ml/hr) 170 ml/hr   Ultrafiltration Removed (ml) 0 ml   Hemodialysis Fistula/Graft Arteriovenous fistula Left Arm   No placement date or time found.   Present on Admission/Arrival: Yes  Access Type: Arteriovenous fistula  Orientation: Left  Access Location: Arm   Site Assessment Clean, dry & intact   Thrill Present   Bruit Present   Status Accessed   Venous Needle Size 15 G   Arterial Needle Size 15 G   Accessed ByBETHA Rye, RN   Access Attempts  1   Date of Last Dressing Change 08/24/24   Access Interventions Chlorhexidine;Aseptic Technique;Needles taped to patient   Dressing Intervention New   Dressing Status New dressing applied;Clean, dry & intact     Post-HD     08/24/24 1140   Treatment   Time Off 1140   Treatment Goal 0ml in 3hr   Observations & Evaluations   Level of Consciousness 0   Oriented X 4   Heart Rhythm Regular   Respiratory Quality/Effort Unlabored   O2 Device None (Room air)   Bilateral Breath Sounds Clear   Skin Condition/Temp Warm;Dry   Edema  Generalized None   Vital Signs   BP 132/68   Temp 98.6 F (37 C)   Pulse (!) 102   Respirations 18   SpO2 98 %   Pain Assessment   Pain Assessment 0-10   Pain Level 3  (patient woke up at end of treatment; mild stomach pain and need to have BM)   Treatment Initiation   Dialyze Hours 3   During Hemodialysis Assessment   Ultrafiltration Removed (ml) 500 ml   Hemodialysis Fistula/Graft Arteriovenous fistula Left Arm   No placement date or time found.   Present on Admission/Arrival: Yes  Access Type: Arteriovenous fistula  Orientation: Left  Access Location: Arm   Site Assessment Clean, dry & intact   Thrill Present   Bruit Present   Status Deaccessed   Date of Last Dressing Change 08/24/24   Dressing Intervention New   Dressing Status New dressing applied;Clean, dry & intact   Post-Hemodialysis Assessment   Post-Treatment Procedures Blood returned;Access bleeding time < 10 minutes   Control And Instrumentation Engineer Disinfection;Acid/Vinegar Clean;Heat Disinfect   Rinseback Volume (ml) 300 ml   Blood Volume Processed (Liters) 59 L   Dialyzer Clearance Lightly streaked   Duration of Treatment (minutes) 180 minutes   Hemodialysis Intake (ml) 500 ml   Hemodialysis Output (ml) 500 ml   NET Removed (ml) 0   Tolerated Treatment Good   Interventions Taken Ultrafiltration goal decreased  (ordered changed for N/V and NPO status)   Physician Notified Yes   Patient Disposition Return to room   Primary RN SBAR: Deward Bottcher, RN (charge RN)  Comments: patient vomited upon arrival.   Able to start treatment without issue. after start of treatment, patient fell asleep and had uneventful treatment. Patient's scheduled Zofran  was given at the end of treatment. No issues with hemostasis.     "

## 2024-08-24 NOTE — Plan of Care (Signed)
"    Problem: Pain  Goal: Verbalizes/displays adequate comfort level or baseline comfort level  Outcome: Progressing     Problem: Safety - Adult  Goal: Free from fall injury  Outcome: Progressing     Problem: Skin/Tissue Integrity  Goal: Skin integrity remains intact  Description: 1.  Monitor for areas of redness and/or skin breakdown  2.  Assess vascular access sites hourly  3.  Every 4-6 hours minimum:  Change oxygen saturation probe site  4.  Every 4-6 hours:  If on nasal continuous positive airway pressure, respiratory therapy assess nares and determine need for appliance change or resting period  Outcome: Progressing     Problem: Chronic Conditions and Co-morbidities  Goal: Patient's chronic conditions and co-morbidity symptoms are monitored and maintained or improved  Outcome: Not Progressing     Problem: Discharge Planning  Goal: Discharge to home or other facility with appropriate resources  Outcome: Not Progressing     Problem: Nutrition Deficit:  Goal: Optimize nutritional status  Outcome: Not Progressing     Problem: Gastrointestinal - Adult  Goal: Minimal or absence of nausea and vomiting  Outcome: Not Progressing     "

## 2024-08-24 NOTE — Care Coordination (Addendum)
"  Care Management Progress Note        Reason for Admission:   Hypertensive emergency [I16.1]  Intractable nausea and vomiting [R11.2]         Patient Admission Status: Inpatient  RUR: 40%  Hospitalization in the last 30 days (Readmission):  No        Transition of care plan:  Patient was discussed in IDR and continues to be medically managed. GI, nephrology, dietary are following.     Dispo: return home with daughter. Patient has a pass on her egress and a 6 on the [employer].     HD: CM has called to [facility] [phone], spoke to [facility] and provided medical updates. CM has faxed clinicals to the HD center at [phone]. CM previously faxed the ID orders to the HD center on 08/15/24.    Discharge plan communicated with patient and/or discharge caregiver: Yes . CM met with pt's daughter at bedside on 11/12 and provided CM updates.     Date 1st IMM letter given: N/a. Patient has a comptroller.     Outpatient follow-up.    Transport at discharge: daughter          ___________________________________________   Joan Pond, RN Case Manager  08/24/2024   4:05 PM     "

## 2024-08-24 NOTE — Consults (Signed)
"  Re-consult request completed, see progress note from today.    "

## 2024-08-24 NOTE — Progress Notes (Addendum)
 "                                                                                                 Melinda Peach, PA-C                       [phone] office             Monday-Friday 8:00 am-4:30 pm  I am not permitted to use perfect serve use above for contact, thanks.        Gastroenterology Progress Note    August 24, 2024  Admit Date: 08/06/2024         Narrative Assessment and Plan   43 year old female well-known to GI with prior history of intractable nausea and vomiting found to have gastroparesis, inability to reliably obtain prokinetic and other GI related medications in outpatient setting, ultimately seen at [facility] with placement of PEG/J tube to address enteral access and GI distress symptoms, now [facility]06/[facility] with hypertensive emergency.  While admitted, GI has been consulted for emesis with J-tube.  Alkaline phosphatase is elevated, GGT was elevated last admission.  Underwent MRCP last admission for assessment of this with no biliary dilation.  Already s/p cholecystectomy 2024.  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 pylori negative.  UDS intermittently positive for THC, although most recently negative in May 2025.  CT A/P confirms gastrojejunostomy tube in appropriate place.     08/15/24:  Patient still with clear liquid/mucus up.  She is not vomiting up her tube feeds, which are being administered into the jejunum.  Urine drug screen negative this admission.     08/24/24: Patient declining tube feeds, taking PO intake, vomiting. Had a BM yesterday. Started allowing tube feeds overnight nad this morning. KUB yesterday: gastrojejunostomy tube appears to be in good position.     Impression:  Intractable nausea and vomiting  Gastroparesis s/p PEG/J     Plan:  PPI BID.   Reasonable to continue reglan  5mg  TID.  Continue tube feeds.  Minimize and stop opioid pain medication use.    Continue with current bowel regimen to maintain once daily BMs.   NPO.  We are not recommending parenteral nutrition and do not plan to manage this in the outpatient setting.     Melinda PARAS Nyari Olsson, PA-C    Subjective:   Reason for visit: Patient refusing feeding tube feeds, taking PO intake, vomiting    HPI: GI reconsulted to see patient due to patient refusing PEG tube feeds, recurrent nausea and vomiting, patient being noncompliant with n.p.o. orders.  Patient is taking p.o. intake and vomiting.  Discussed situation with the patient using Spanish interpreter Nena, session code [id number]. She reports she did get tube feeds last night and today. Reviewed with her important steps to decrease nausea and vomiting including NOT taking in food by mouth if it is going to lead to vomiting, relying on her PEG-J tube for nutrition (explained that the PEG-J bypasses the stomach and delivers nutrition straight  into small intestin), and decreasing opioid pain medication use. Discussed with patient the risks of TPN including thrombus and bloodstream infection. Patient is agreeable to proceeding with nutrition via her current feeding tube.       ROS:  The previous review of systems on initial consultation / H&P is noted and reviewed.  Specific changes noted above in HPI.    Current Medications:     Current Facility-Administered Medications   Medication Dose Route Frequency    oxyCODONE  (ROXICODONE  INTENSOL) 100 MG/5ML concentrated solution 5 mg  5 mg Per G Tube BID PRN    cloNIDine  (CATAPRES ) 0.2 MG/24HR 1 patch  1 patch TransDERmal Weekly    ondansetron  (ZOFRAN ) injection 4 mg  4 mg IntraMUSCular Q6H    lansoprazole  (PREVACID  SOLUTAB) disintegrating tablet 30 mg  30 mg Per G Tube BID    metoclopramide  (REGLAN ) 5 MG/5ML solution 5 mg  5 mg Per G Tube TID AC    prochlorperazine  (COMPAZINE ) suppository 25 mg  25 mg Rectal Q12H PRN    [Held by provider] prochlorperazine  (COMPAZINE ) injection 10 mg  10 mg IntraVENous Q6H    [Held by  provider] ondansetron  (ZOFRAN ) injection 4 mg  4 mg IntraVENous Q6H    sodium chloride  flush 0.9 % injection 5-40 mL  5-40 mL IntraVENous 2 times per day    sodium chloride  flush 0.9 % injection 5-40 mL  5-40 mL IntraVENous PRN    0.9 % sodium chloride  infusion   IntraVENous PRN    lidocaine  PF 1 % injection 50 mg  50 mg IntraDERmal Once    dextrose  bolus 10% 125 mL  125 mL IntraVENous PRN    Or    dextrose  bolus 10% 250 mL  250 mL IntraVENous PRN    glucagon  injection 1 mg  1 mg SubCUTAneous PRN    dextrose  10 % infusion   IntraVENous Continuous PRN    hydrALAZINE  (APRESOLINE ) tablet 100 mg  100 mg Per G Tube TID    glucose chewable tablet 16 g  4 tablet Per G Tube PRN    labetalol  (NORMODYNE ;TRANDATE ) injection 10 mg  10 mg IntraVENous Q4H PRN    acetaminophen  (TYLENOL ) tablet 1,000 mg  1,000 mg PEG Tube 4 times per day    dicyclomine  (BENTYL ) capsule 10 mg  10 mg PEG Tube TID AC    polyethylene glycol (GLYCOLAX ) packet 17 g  17 g Per G Tube Daily PRN    sevelamer  (RENVELA ) packet 0.8 g  0.8 g Per G Tube TID WC    sodium bicarbonate  tablet 650 mg  650 mg PEG Tube TID    bisacodyl  (DULCOLAX) suppository 10 mg  10 mg Rectal Daily PRN    [Held by provider] epoetin  alfa-epbx (RETACRIT ) injection 10,000 Units  10,000 Units SubCUTAneous Q MWF    aspirin  chewable tablet 81 mg  81 mg Per G Tube Daily    atorvastatin  (LIPITOR ) tablet 80 mg  80 mg Per G Tube QHS    busPIRone  (BUSPAR ) tablet 5 mg  5 mg Per G Tube BID    clopidogrel  (PLAVIX ) tablet 75 mg  75 mg Per G Tube Daily    DULoxetine  (CYMBALTA ) extended release capsule 60 mg  60 mg Per G Tube Daily    famotidine  (PEPCID ) tablet 20 mg  20 mg Per G Tube Daily    sennosides-docusate sodium  (SENOKOT-S) 8.6-50 MG tablet 1 tablet  1 tablet Per G Tube BID    vitamin B-6 (PYRIDOXINE ) tablet 25 mg  25 mg Per G Tube Q6H    ceFAZolin  (ANCEF ) 1,000 mg in sterile water  10 mL IV syringe  1,000 mg IntraVENous Daily    0.9 % sodium chloride  infusion   IntraVENous PRN    carvedilol   (COREG ) tablet 25 mg  25 mg Per G Tube BID WC    scopolamine  (TRANSDERM-SCOP) transdermal patch 1 patch  1 patch TransDERmal Q72H    albumin  human 25% IV solution 25 g  25 g IntraVENous PRN    0.9 % sodium chloride  infusion   IntraVENous PRN    heparin  (porcine) injection 5,000 Units  5,000 Units SubCUTAneous 3 times per day    insulin  lispro (HUMALOG ,ADMELOG ) injection vial 0-8 Units  0-8 Units SubCUTAneous 4x Daily AC & HS       Objective:     VITALS:   Last 24hrs VS reviewed since prior progress note. Most recent are:  Vitals:    08/24/24 1218   BP: (!) 164/88   Pulse:    Resp:    Temp:    SpO2:      Temp (24hrs), Avg:98.4 F (36.9 C), Min:98.1 F (36.7 C), Max:98.6 F (37 C)      Intake/Output Summary (Last 24 hours) at 08/24/2024 1301  Last data filed at 08/24/2024 1140  Gross per 24 hour   Intake 825 ml   Output 1175 ml   Net -350 ml       General: alert and no distress  Head: Normocephalic, without obvious abnormality, atraumatic  Eyes: anicteric sclerae and conjuntiva clear  Lungs: normal respiratory effort  Ext: no cyanosis and no edema  Skin: normal skin color, no rashes, and texture normal  Neuro:  Alert and oriented  Psych: not anxious, cooperative, appropriate affect     Lab Data Reviewed:   Recent Labs     08/22/24  0454 08/23/24  0239 08/24/24  0758   WBC 8.0 6.9 6.1   HGB 11.6 11.2* 11.7   HCT 36.8 35.7 36.7   PLT 456* 425* 451*     Recent Labs     08/22/24  0454 08/23/24  0239 08/24/24  0502 08/24/24  0758   NA 130* 136 133*  --    K 3.8 4.1 3.9  --    CL 88* 95* 91*  --    CO2 28 32* 30*  --    BUN 27* 14 31*  --    MG 2.2  --   --  2.3   PHOS 3.3  --   --  4.3   ALT <5* <5*  --   --      No results found for: GLUCPOC  No results for input(s): PH, PCO2, PO2, HCO3, FIO2 in the last 72 hours.  No results for input(s): INR in the last 72 hours.        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

## 2024-08-24 NOTE — Significant Event (Addendum)
"  Rapid called on patient.     Routine EKG ordered today to evaluate Qtc given antiemetic & prokinetic use. Qtc was normal however EKG read as STEMI. At this time, rapid was called on patient.     Patient with no chest pain. No hypoxia. No SOB. Feeling at baseline.     EKG reviewed. Showed ST elevation in V1 minimally ~ 1 mm. ST elevation in V2 < 1 mm. Not meeting full criteria for STEMI; however, requested that code STEMI be called for cardiology to review. STEMI called. Reviewed EKG with cardiology. Changes attributed to LVH. Did not feel findings were consistent with STEMI. Code STEMI cancelled     I have provided a total of 35 minutes of critical care time excluding procedures.  During this entire length of time I was immediately available to the patient. The reason for providing this level of medical care was due to a critical illness that impaired one or more vital organ systems, such that there was a high probability of imminent or life threatening deterioration in the patient's condition. This care involved high complexity decision making which includes reviewing the patient's past medical records, current laboratory results, and films in order to assess, support vital system function, and to treat this degree of vital organ system failure, and to prevent further life threatening deterioration of the patients condition.     "

## 2024-08-24 NOTE — Progress Notes (Addendum)
"  Tube feeding stopped at midnight, patient informed not to eat or drink anything for procedure in the morning. Patient drank water  and eating gum despite education regarding the risk of eating and drinking before going for the procedure. Patient had emesis totaling 800 ml. Patient has not been adhering to the NPO order.           Patient asked for Pepsi, I informed her again about not having anything to eat or drink, patient got very upset and stormed out of the room saying I will get it myself. Patient drank ginger ale and ate the ice pop.       "

## 2024-08-24 NOTE — Significant Event (Addendum)
"  Rapid Called at 1212 at (426/01)    Responded to RRT at 1212 for Tachycardia    Provider at bedside: Yes, Dr Levander  Interventions ordered: EKG  Sepsis Suspected: No  Transfer to Higher Level of Care: No  Blood Glucose: 124     Situation:  Called to bedside for abnormal EKG.      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  Patient denies any CP reports some SOB.  VSS, S1, S2, rapid, LCTA.  Strong equal pulses in all extremities.  No edema noted.       Intervention/Recommendation:  EKG, code STMI called then canceled by Dr Levander.      Reevaluation: 1400 patient resting quietly on left side.      RRT verbally is released by primary RN.       Vitals:    08/24/24 1306   BP: (!) 151/91   Pulse: (!) 112   Resp:    Temp: 98.1 F (36.7 C)   SpO2: 100%        Rapid Ended at 1310  RRT RN assisted with transport to accepting unit NA.    RONAL FORBES ALERT, RN   "

## 2024-08-24 NOTE — Progress Notes (Signed)
 "Comprehensive Nutrition Assessment    Type and Reason for Visit: Reassess    Nutrition Recommendations/Plan:     Once Hohn is placed, begin TPN per below:     TPN as follows:       Day 1: 1008 mL, 150 g carbs, 50 g protein (D15/AA5 @ 42 mL/hr)       Day 2: 1512 mL, 225 g carbs, 76 g protein (D15/AA5 @ 63 mL/hour) (goal)    Lipids: Check triglycerides and if <400, add lipids per below              - Lipids 20% 500 mL, 3x/week  - Monitor K, Phos, Mg until stable x 3 days with nutrition, replace PRN  - Hold TPN at current rate if electrolytes are not stable    2.   Continue NPO    3.   Until Hohn is placed, Continue to encourage acceptance of enteral feeds per below:   - Nepro @ 35 mL/hour  - FWF 75 mL q 2 hours        Malnutrition Assessment:  Malnutrition Status:  Insufficient data (08/07/24 1316)    Context:  Chronic Illness     Findings of the 6 clinical characteristics of malnutrition:  Energy Intake:  Mild decrease in energy intake  Weight Loss:  Mild weight loss     Body Fat Loss:  Unable to assess     Muscle Mass Loss:  Unable to assess    Fluid Accumulation:  No fluid accumulation    Grip Strength:  Not Performed     Nutrition Assessment:    43 yo female admitted for HTN emergency, N/V. Pmhx: DM, Gastroparesis, HTN, ESRD - on HD, GJ tube.      11.14: Follow Up. Pt accepting of TF yesterday, ran until midnight when NPO was ordered. Nurse reports that pt has continued to be non-compliant with orders. Has continued eating ice cream and drinking soda despite RN educating pt on need for NPO. Emesis has continued. Hohn order has been placed. New plan is for hohn placement + TPN given patients inability to follow instruction and low confidence that pt would adequately managed home TF. Awaiting IR availability for placement. Unsure if they can accommodate pt on today's schedule, may not have availability until early next week.     TPN at goal rate 63 mL/hour with lipids 3x/week provides 1502 kcal (100% needs); 76 g  protein (100% needs).    _________________________________________________    11.13: Follow up. No significant changes. Pt NPO since midnight for KUB today. Will continue to leave TF recs for if/when pt is accepting. Will also continue to leave PN recs as pt has been without adequate nutrition for greater than 1 week. Labs reviewed.     11.12: Follow Up. Pt remains appropriate for PPN however previous Attending did not favor this. Tube feeds have continued to be refused yesterday and this AM. Pt is frequently eating snack items including applesauce, ice cream and apples, however she has frequent episodes of emesis after PO intake. Has not been complying with recommendations from nursing or myself. Refusing some aspects of care this AM. Currently OTF for scheduled MWF HD. Will continue to leave PN recs above. TF order will remain - spoke with nursing about continued encouragement for TF acceptance. Was hoping by now pt would understand TF is not what's leading to emesis. This has been explained to her many times by attending, GI, and RD. Labs reviewed, noted hyponatremia.  11.10: Follow Up. Last Friday, pt became adamant about refusing TF and only wishing to trial PO diet. This was done over the weekend and pt has continued with N/V 1-2 hours after postprandial - regardless of what's eaten. Continues to refuse TF this AM and is still unable to tolerate PO w/o N/V soon after. Pt appropriate for PN at this point. Labs reviewed. Will reach out to attending regarding PN ordering.     PPN at goal rate 75 mL/hour with lipids 3x/week provides 1038 kcal (76% needs); 77 g protein (100% needs)    11.07: Follow Up. TF ran yesterday at 35 mL/hr goal - stopped overnight after emesis. This occurred after diet was advanced to regular per pt request and a full meal was eaten. Likely too ambitious after NPO x7 days. Given jejunum placement of TF, I doubt it alone caused emesis especially at low rate of feeds. Was likely  combination of full meal + TF ISO of gastroparesis and prior period of NPO. Advocated for continuing TF at goal rate and restricting diet to full liquids. Need to take diet advancement slow to assess tolerance. No family in room bedside when I stopped by - will plan to check after noon. Will continue TF @ 35 mL/hr and monitor tolerance. Labs reviewed, lytes WNL.    11/6: Follow up. TF has not been ran since morning of 11.05. PN order placed but has not been delivered due to inadequate access. GI assessment yesterday concluded small frequent emesis is not composed of or caused by tube feeds. Will re-start TF today. Modifying order from cyclic to continuous and will attempt to meet needs with TF alone. Per nurse, small emesis has continued this AM - clear/mucus. Will trial continuous TF today and reassess tomorrow.     Trickle feeds Nepro @ 35 mL/hour provides 1386 kcal (100% needs),123 g carbs, 62 g protein (90% needs) TF + FWF provides 1384 H2O/day.     11/5:  Follow up.  TF ran over night 11/3 as Nepro at 30 ml/hr x 10 hours which she tolerated with no complaints.  Last night we increased the rate to 40 ml/hr and she had n/v.  NPO now.  GI to see.  Spoke with daughter who states feeding was administered through J-port.  Will need to start PN until pt able to tolerate PO/TF.    PPN rec's:  goal rate of D5, 4.25% AA at 83 ml/hr + 250 ml 20% lipids MWF (TG level of 352).    This will provide 890 kcals (65% kcal needs), 85 gm protein (100% protein needs)    11/3: Follow up.  Pt remains NPO.  Have reached out to MD regarding need to start nutrition support, J-tube feeds vs PPN.  Still having N/V.  No new weight. TF rec's below in bold.      10/30:  Follow up.  Pt remains NPO.  Will recommend starting TF via J port.  Still having N/V.  No new weight.      10/28: MD consult received for TF rec's. Chart review indicate G-J tube placed in August 2025 and started on cyclic TF.  Spoke with pt's daughter at bedside.  She  reports pt eats by mouth at home.  Some days she has n/v and is not able to tolerate PO so she gives herself 3 bolus feeds of NovaSource Renal 2.0 3x/day.  Pt and daughter unsure how much she gives herself, 2 large syringes full of feed.  On days  when she eats and tolerates PO then she does not use feeding tube or might do one feed at night.  When I asked what foods she eats by mouth, she told me soups, cooked vegetables, and fruits.  Discussed low fiber diet necessary for gastroparesis, recommended all vegetables be cooked very soft, no beans in her soups, and fruits be canned.  Daughter states she has been losing weight, unable to quantify.  Weight hx in EMR indicates 6% loss over last 5 months which is not significant for time frame.  Weight seems to fluctuate between 115-135 lbs over last 1.5 years.       Pt currently NPO, MD wants to hold off on starting TF.  Pt was actively vomiting during my visit.  Did not complete NFPE because of this.      TF rec's when ready to start TF:  Nepro at 40 ml/hr x 10 hours + 1 ProSource daily + 30 ml h20 flush q4h around the clock.  This will provide 400 ml, 800 kcals (59% kcal needs), 52 gm protein (74% protein needs), 64 gm CHO, 290 + 30 + 180 = 500 ml water           Nutritionally Significant Medications:  NaCl at 100 ml/hr; Epo, Renvela , Vit B6    Estimated Daily Nutrient Needs:  Energy Requirements Based On: Kcal/kg  Weight Used for Energy Requirements: Current  Energy (kcal/day): 1365-1640 (25-30 kcals/kg)  Weight Used for Protein Requirements: Current  Protein (g/day): 70 (1.3 gm/kg)  Method Used for Fluid Requirements: 1 ml/kcal  Fluid (ml/day): 1365    Nutrition Related Findings:   Edema: None   Edema Generalized: None  RUE Edema: +1           Recent Labs     08/22/24  0454 08/23/24  0239 08/24/24  0502 08/24/24  0758   GLUCOSE 182* 190* 155*  --    BUN 27* 14 31*  --    CREATININE 5.63* 4.08* 6.13*  --    NA 130* 136 133*  --    K 3.8 4.1 3.9  --    CL 88* 95* 91*  --     CO2 28 32* 30*  --    CALCIUM  8.7 8.4* 8.5*  --    PHOS 3.3  --   --  4.3   MG 2.2  --   --  2.3     Recent Labs     08/21/24  1227 08/21/24  1900 08/21/24  1945 08/22/24  0804 08/22/24  1227 08/22/24  1629 08/22/24  1952 08/23/24  0853 08/23/24  1157 08/23/24  1626 08/23/24  2025 08/24/24  0729   POCGLU 229* 187* 184* 263* 117 293* 87 187* 211* 176* 242* 205*     Lab Results   Component Value Date/Time    LABA1C 6.1 06/27/2024 05:10 AM    LABA1C 7.0 02/19/2024 05:02 AM    LABA1C 6.8 10/24/2023 03:52 AM    EAG 127 06/27/2024 05:10 AM     Triglycerides   Date Value Ref Range Status   08/14/2024 352 (H) 0 - 150 MG/DL Final     Comment:     Borderline High: 150-199 mg/dL, High: 799-500 mg/dL  Very High: Greater than or equal to 500 mg/dL     89/71/7974 828 (H) 0 - 150 MG/DL Final     Comment:     Borderline High: 150-199 mg/dL, High: 799-500 mg/dL  Very High: Greater than or equal  to 500 mg/dL     98/98/7974 844 (H) <150 MG/DL Final     Comment:     Based on NCEP-ATP III:  Triglycerides <150 mg/dL  is considered normal, 150-199 mg/dL  borderline high,  799-500 mg/dL high and  greater than or equal to 500 mg/dL very high.       Last BM: 08/11/24    Wounds: Wound Type: None    Current Nutrition Therapies:  Diet: NPO  Supplements: none  Meal Intake:   No data found.  Supplement Intake:  No data found.  Nutrition Support: none    Anthropometric Measures:  Height: 149.9 cm (4' 11.02)  Ideal Body Weight (IBW): 95 lbs (43 kg)       Current Body Weight: 54.6 kg (120 lb 5.9 oz), 126.7 % IBW. Weight Source: Bed scale  Current BMI (kg/m2): 24.3        Weight Adjustment For: No Adjustment                 BMI Categories: Normal Weight (BMI 18.5-24.9)    Wt Readings from Last 10 Encounters:   08/06/24 54.6 kg (120 lb 5.9 oz)   07/20/24 57.2 kg (126 lb 1.7 oz)   07/11/24 54.6 kg (120 lb 5.9 oz)   07/09/24 55.5 kg (122 lb 5.7 oz)   03/06/24 57.6 kg (127 lb)   02/21/24 58 kg (127 lb 13.9 oz)   11/28/23 52.2 kg (115 lb 1.3 oz)    10/31/23 52.6 kg (115 lb 15.4 oz)   10/21/23 55.2 kg (121 lb 11.1 oz)   10/12/23 60.4 kg (133 lb 3.2 oz)       Nutrition Diagnosis:   Inadequate protein-energy intake related to nausea/vomiting/diarrhea as evidenced by poor intake prior to admission    Nutrition Interventions:   Food and/or Nutrient Delivery: Continue Current Tube Feeding, Start Parenteral Nutrition  Nutrition Education/Counseling: Education/Counseling initiated  Coordination of Nutrition Care: Continue to monitor while inpatient       Goals:  Previous Goal Met: Progressing toward Goal(s)  Goals: Tolerate nutrition support at goal rate, by next RD assessment       Nutrition Monitoring and Evaluation:   Behavioral-Environmental Outcomes: None Identified  Food/Nutrient Intake Outcomes: Diet Advancement/Tolerance, Food and Nutrient Intake, Enteral Nutrition Intake/Tolerance  Physical Signs/Symptoms Outcomes: Biochemical Data, Nausea or Vomiting, Weight    Discharge Planning:    Too soon to determine     Victory Ranger, MS, RD  Available via [employer]    "

## 2024-08-24 NOTE — Progress Notes (Signed)
 "                            [place] [facility]  Date of Birth: 10/14/1980          Assessment & Plan:     ESRD on HD MWF via [facility], [facility], f/b Dr. Kalvin  Hypertensive urgency-better after HD  DM  AOCD  Chronic abd pain, n/v from gastroparesis-reason for admit  Staph bacteremia cxs now negative     Plan:  Patient is receiving HD treatment and UF goal reduced to 1 kg  ESA MWF  Continue cefazolin  cefazolin  2/2/3 post HD until 09/19/24-spoke with her clinic and they are aware  Discussed with patient and HD RN         Subjective:   CC: ESRD  HPI: Nausea.  Patient is receiving HD treatment.  UF goal reduced to 1 kg    Current Facility-Administered Medications   Medication Dose Route Frequency    cloNIDine  (CATAPRES ) 0.2 MG/24HR 1 patch  1 patch TransDERmal Weekly    ondansetron  (ZOFRAN ) injection 4 mg  4 mg IntraMUSCular Q6H    lansoprazole  (PREVACID  SOLUTAB) disintegrating tablet 30 mg  30 mg Per G Tube BID    metoclopramide  (REGLAN ) 5 MG/5ML solution 5 mg  5 mg Per G Tube TID AC    prochlorperazine  (COMPAZINE ) suppository 25 mg  25 mg Rectal Q12H PRN    HYDROmorphone  HCl PF (DILAUDID ) injection 0.5 mg  0.5 mg IntraMUSCular Q4H PRN    [Held by provider] prochlorperazine  (COMPAZINE ) injection 10 mg  10 mg IntraVENous Q6H    [Held by provider] ondansetron  (ZOFRAN ) injection 4 mg  4 mg IntraVENous Q6H    sodium chloride  flush 0.9 % injection 5-40 mL  5-40 mL IntraVENous 2 times per day    sodium chloride  flush 0.9 % injection 5-40 mL  5-40 mL IntraVENous PRN    0.9 % sodium chloride  infusion   IntraVENous PRN    lidocaine  PF 1 % injection 50 mg  50 mg IntraDERmal Once    dextrose  bolus 10% 125 mL  125 mL IntraVENous PRN    Or    dextrose  bolus 10% 250 mL  250 mL IntraVENous PRN    glucagon  injection 1 mg  1 mg SubCUTAneous PRN    dextrose  10 % infusion   IntraVENous Continuous PRN    hydrALAZINE  (APRESOLINE ) tablet 100 mg  100 mg Per G Tube TID    glucose chewable tablet  16 g  4 tablet Per G Tube PRN    [Held by provider] HYDROmorphone  HCl PF (DILAUDID ) injection 0.5 mg  0.5 mg IntraVENous Q4H PRN    Or    [Held by provider] HYDROmorphone  HCl PF (DILAUDID ) injection 1 mg  1 mg IntraVENous Q4H PRN    labetalol  (NORMODYNE ;TRANDATE ) injection 10 mg  10 mg IntraVENous Q4H PRN    acetaminophen  (TYLENOL ) tablet 1,000 mg  1,000 mg PEG Tube 4 times per day    dicyclomine  (BENTYL ) capsule 10 mg  10 mg PEG Tube TID AC    polyethylene glycol (GLYCOLAX ) packet 17 g  17 g Per G Tube Daily PRN    sevelamer  (RENVELA ) packet 0.8 g  0.8 g Per G Tube TID WC    sodium bicarbonate  tablet 650 mg  650 mg PEG Tube TID    oxyCODONE  (ROXICODONE  INTENSOL) 100 MG/5ML concentrated solution 5 mg  5 mg Per G Tube Q4H PRN    bisacodyl  (DULCOLAX) suppository 10 mg  10 mg Rectal Daily PRN    [Held by provider] epoetin  alfa-epbx (RETACRIT ) injection 10,000 Units  10,000 Units SubCUTAneous Q MWF    aspirin  chewable tablet 81 mg  81 mg Per G Tube Daily    atorvastatin  (LIPITOR ) tablet 80 mg  80 mg Per G Tube QHS    busPIRone  (BUSPAR ) tablet 5 mg  5 mg Per G Tube BID    clopidogrel  (PLAVIX ) tablet 75 mg  75 mg Per G Tube Daily    DULoxetine  (CYMBALTA ) extended release capsule 60 mg  60 mg Per G Tube Daily    famotidine  (PEPCID ) tablet 20 mg  20 mg Per G Tube Daily    sennosides-docusate sodium  (SENOKOT-S) 8.6-50 MG tablet 1 tablet  1 tablet Per G Tube BID    vitamin B-6 (PYRIDOXINE ) tablet 25 mg  25 mg Per G Tube Q6H    ceFAZolin  (ANCEF ) 1,000 mg in sterile water  10 mL IV syringe  1,000 mg IntraVENous Daily    0.9 % sodium chloride  infusion   IntraVENous PRN    carvedilol  (COREG ) tablet 25 mg  25 mg Per G Tube BID WC    scopolamine  (TRANSDERM-SCOP) transdermal patch 1 patch  1 patch TransDERmal Q72H    albumin  human 25% IV solution 25 g  25 g IntraVENous PRN    0.9 % sodium chloride  infusion   IntraVENous PRN    heparin  (porcine) injection 5,000 Units  5,000 Units SubCUTAneous 3 times per day    insulin  lispro  (HUMALOG ,ADMELOG ) injection vial 0-8 Units  0-8 Units SubCUTAneous 4x Daily AC & HS          Objective:     Vitals:  Blood pressure 129/73, pulse (!) 101, temperature 98.6 F (37 C), resp. rate 18, height 1.499 m (4' 11.02), weight 54.6 kg (120 lb 5.9 oz), SpO2 99%.  Temp (24hrs), Avg:98.4 F (36.9 C), Min:98.1 F (36.7 C), Max:98.6 F (37 C)      Intake and Output:  No intake/output data recorded.  11/12 1901 - 11/14 0700  In: 325   Out: 475     Physical Exam:               GENERAL ASSESSMENT: uncomfortable, blankets, sheets soiled  HEENT: Nontraumatic   EXTREMITY: no EDEMA; LUE AVG NT, no swelling/warmth, good t/b  NEURO: Grossly non focal          ECG/rhythm:    Data Review        Recent Labs     08/22/24  0454 08/23/24  0239 08/24/24  0502   NA 130* 136 133*   K 3.8 4.1 3.9   CL 88* 95* 91*   CO2 28 32* 30*   BUN 27* 14 31*   CREATININE 5.63* 4.08* 6.13*   GLUCOSE 182* 190* 155*   CALCIUM  8.7 8.4* 8.5*       : Melinda [name] Melinda Doe, MD  08/24/2024        [facility]:  [email or web address]  [email or web address]    [place] office:  [address] [place], [address]  [place], [place] [address]  Phone: [phone]  Fax :     [phone]    [facility] office:  [address]  [place], [place]  [address]  Phone - [phone]  Fax - [phone]                                                                                        "

## 2024-08-24 NOTE — Progress Notes (Addendum)
 "    Hospitalist Progress Note      NAME:  Melinda [name]   DOB:  November 23, 1980  MRM:  [id number]    Date/Time: 08/24/2024  3:35 PM           Assessment / Plan:     43 year old female with ESRD, type 2 diabetes, hypertension, admitted 08/06/24 with acute on chronic vomiting and abdominal pain likely secondary to gastroparesis.     Gastroparesis with emesis abdominal pain s/p GJ tube  POA, acute on chronic. Reason for admission. Patient has chronic intractable N/V which lead to GJ placement, but sxs have continued. Normal abdominal imaging. GJ with correct placement. Evaluated by GI, sxs will be difficult to treat & PO intake will lead to continued N/V. It has been explained to patient that GJ is past ligament of treitz, GJ feeds are not the cause of her sxs, and feeds will provide nutrition even in setting of emesis. Recommend continued tube feeds; however patient has been noncompliant with plan.    - KUB (11/12) with proper GJ positioning. Continues to be noncompliant with NPO & ongoing N/V; however willing to stay on tube feeds at this point.   - GI re-evaluated. Recommend continue current plan with TF & bowel regimen. They do not recommend parenteral nutrition & cannot manage this in the OP setting. Cancelled order for Hohn.   - Diet: Strict NPO. Maintain nutrition through tube feeds as possible. Dietician following. If she fails to maintain tube feeds, will need to readdress parenteral nutrition with GI/dietitian as all other options exhausted   - PPI, Pepcid , scopolamine  patch   - Reglan  and zofran  scheduled   - Bowel regimen, goal 1 BM per day. Suppository PRN if no BM  - Reduce opioids, switch to tramadol  PRN BID    Sepsis secondary to MSSA bacteremia  Evaluated by ID. Low suspicion however cannot rule out AV graft infected hematoma.  Vascular surgery followed care, no intervention planned   - Per ID, cefazolin  2 g on HD days through 09/19/2024 per ID     Alpha Streptococcus isolated from urine   Evaluated  by ID. Likely colonization   - Abx as above    Hypertension POA  Elevated BPs  POA, chronic.   - Continue home Coreg  and hydralazine   - Nifedipine  discontinued (cannot be crushed). Now on clonidine  patch     Prolonged QT  Qtc 503. 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 Phosphorus now normal  - Continue sevelamer      ESRD  On HD  - Nephrology following     GERD  Chronic.   - Continue Pepcid  and PPI     T2DM with gastroparesis.   POA, chronic.  Last A1c of 6.1 on 9/17.    - Sliding scale insulin .      Chronic Back pain   No e/o discitis, noted DDD and Moderate to severe canal stenosis at C4-5 and moderate canal stenosis at C3-C4   - Duloxetine    - Pain control PRN    Anxiety/depression  Adjustment disorder in setting of medical issues/prolonged stay  There was concern about her flat affect, not caring for self (urinating on self, refusing to be cleaned), noncompliance with medical plan. Evaluated by psychiatry (11/7), their assessment was not consistent with self-induced vomiting, eating disorder, or psychotic process   -  Duloxetine  and BuSpar .  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   - Continue plavix  and ASA  - Continue statin      Anemia  Mild and intermittent during hospital stay from 9 - 12. In setting of acute illness and ESRD  - Stable   - ESA per nephrology     Fentanyl  positive  Seen on drug screen. May be a false positive in the setting of Benadryl  use. Patient adamantly denies use.      #BMI (Calculated): 24.3    I have personally reviewed the radiographs, laboratory data in Epic and decisions and statements above are based partially on this personal interpretation.                 Care Plan discussed with: Patient, Family, Care Manager, Nursing Staff, and Consultant/Specialist    Discussed:  Care  Plan and D/C Planning    Prophylaxis:  Hep SQ    Disposition:  Home w/Family    Total time spent with patient care: 45 Minutes  including critical care time. See sig event noted.  **I personally saw and examined the patient during this time period**           ___________________________________________________    Attending Physician: Thena cross, MD        Subjective:     Chief Complaint:  See sig event note    Long conversation today with patient & family. I discussed the importance of her nutrition.  I discussed my concerns that she is not getting adequate nutrition as she has not been accepting the tube feeds.  I explained that anything that she was in her mouth is going to induce nausea and vomiting due to her gastroparesis.  I explained the her vomiting is worsening her dehydration and nutritional status.    I explained that my goals for the patient are to provide her adequate nutrition as she is healthy and asked to leave the hospital.  We discussed her goals for herself.  Patient affirmed that she shares the same goals and would like to leave the hospital.  Her family also affirmed that they share the same goals and would like her to leave the hospital.  She expressed understanding that she needs nutrition in order to do this.  She had that the reason she has been noncompliant with n.p.o. is because she is hungry.  I explained that the tube feeds will provide her adequate nutrition that will help decrease her hunger drive.  I also explained that we can provide her with sponges to help with hydrating her mouth.  I explained that any food that she is putting her mouth is not being digested and therefore not providing nutrition to curb her hunger drive anyways.  She asked if she can at least have 1 popsicle per day.  I explained to her that I am concerned she will continue vomiting with this however if she is willing to continue her tube feeds and maintain n.p.o. status otherwise, we can discuss allowing her to  have 1 popsicle per day starting tomorrow if she still feels she needs it once the tube feeds are taking effect.  Patient is agreeable to this plan.    I discussed the case today with the GI team, Dr. Kirby.  He is recommending that we use tube feeds needed for nutrition.  He states that  they do not manage parenteral nutrition outpatient and this would not be a viable solution for him to do outpatient.  He also notes that TPN is not a good long-term solution, especially with infection risk in the setting of this patient's bacteremia which I do agree with.  However, I did share my concerns that if she is unable to maintain tube feeds that we do not have any other nutritional options for the patient.  At this time, we decided to work on a trial with tube feeds this weekend and see how she does.  Will continue to work with the GI team on affirming her nutritional plan prior to discharge.    I discussed the case today with the dietitian.  He states that if she is able to maintain her current tube feed order, that she would meet her nutritional needs.  They state that in order for someone to get parenteral nutrition outpatient they would need a doctor to be following along and cosigning the orders.  Also would need to be responsible for the line.  Currently, patient does not have a physician that is able to do this.  As the indication for parenteral nutrition would be GI in nature, would likely need the GI team on board with this plan.  Hopefully, it does not come to this and she is able to tolerate the tube feeds with her 40 trial this weekend.  Dietitian in agreement with our current plan as well.    ROS:  12+ ROS reviewed with patient and negative with exception of above           Objective:       Vitals:          Last 24hrs VS reviewed since prior progress note. Most recent are:    Vitals:    08/24/24 1306   BP: (!) 151/91   Pulse: (!) 112   Resp:    Temp: 98.1 F (36.7 C)   SpO2: 100%     SpO2 Readings from Last 6  Encounters:   08/24/24 100%   07/20/24 94%   07/11/24 98%   07/09/24 98%   07/05/24 95%   07/02/24 95%          Intake/Output Summary (Last 24 hours) at 08/24/2024 1535  Last data filed at 08/24/2024 1140  Gross per 24 hour   Intake 825 ml   Output 1175 ml   Net -350 ml          Exam:     Physical Exam:     Physical Exam  Constitutional:       General: She is not in acute distress.     Appearance: She is not ill-appearing, toxic-appearing or diaphoretic.      Comments: Disheveled. Malodorous. Actively vomiting during interview    HENT:      Head: Normocephalic and atraumatic.      Right Ear: External ear normal.      Left Ear: External ear normal.      Nose: Nose normal.      Mouth/Throat:      Pharynx: Oropharynx is clear.   Eyes:      Extraocular Movements: Extraocular movements intact.      Conjunctiva/sclera: Conjunctivae normal.   Cardiovascular:      Rate and Rhythm: Normal rate.   Pulmonary:      Effort: Pulmonary effort is normal.   Abdominal:      General: Abdomen is flat. There is no distension.  Palpations: Abdomen is soft.      Tenderness: There is abdominal tenderness (generalized).      Comments: GJ tube normal   Musculoskeletal:         General: No deformity.   Skin:     General: Skin is warm and dry.   Neurological:      General: No focal deficit present.   Psychiatric:         Mood and Affect: Mood normal.      Comments: Flat affect.               Medications Reviewed: (see below)    Lab Data Reviewed: (see below)    ______________________________________________________________________    Medications:     Current Facility-Administered Medications   Medication Dose Route Frequency    traMADol  (ULTRAM ) tablet 50 mg  50 mg PEG Tube Q12H PRN    acetaminophen  (TYLENOL ) tablet 1,000 mg  1,000 mg PEG Tube Q6H    cloNIDine  (CATAPRES ) 0.2 MG/24HR 1 patch  1 patch TransDERmal Weekly    ondansetron  (ZOFRAN ) injection 4 mg  4 mg IntraMUSCular Q6H    lansoprazole  (PREVACID  SOLUTAB) disintegrating tablet 30  mg  30 mg Per G Tube BID    metoclopramide  (REGLAN ) 5 MG/5ML solution 5 mg  5 mg Per G Tube TID AC    prochlorperazine  (COMPAZINE ) suppository 25 mg  25 mg Rectal Q12H PRN    [Held by provider] prochlorperazine  (COMPAZINE ) injection 10 mg  10 mg IntraVENous Q6H    [Held by provider] ondansetron  (ZOFRAN ) injection 4 mg  4 mg IntraVENous Q6H    sodium chloride  flush 0.9 % injection 5-40 mL  5-40 mL IntraVENous 2 times per day    sodium chloride  flush 0.9 % injection 5-40 mL  5-40 mL IntraVENous PRN    0.9 % sodium chloride  infusion   IntraVENous PRN    lidocaine  PF 1 % injection 50 mg  50 mg IntraDERmal Once    dextrose  bolus 10% 125 mL  125 mL IntraVENous PRN    Or    dextrose  bolus 10% 250 mL  250 mL IntraVENous PRN    glucagon  injection 1 mg  1 mg SubCUTAneous PRN    dextrose  10 % infusion   IntraVENous Continuous PRN    hydrALAZINE  (APRESOLINE ) tablet 100 mg  100 mg Per G Tube TID    glucose chewable tablet 16 g  4 tablet Per G Tube PRN    labetalol  (NORMODYNE ;TRANDATE ) injection 10 mg  10 mg IntraVENous Q4H PRN    dicyclomine  (BENTYL ) capsule 10 mg  10 mg PEG Tube TID AC    polyethylene glycol (GLYCOLAX ) packet 17 g  17 g Per G Tube Daily PRN    sevelamer  (RENVELA ) packet 0.8 g  0.8 g Per G Tube TID WC    sodium bicarbonate  tablet 650 mg  650 mg PEG Tube TID    bisacodyl  (DULCOLAX) suppository 10 mg  10 mg Rectal Daily PRN    [Held by provider] epoetin  alfa-epbx (RETACRIT ) injection 10,000 Units  10,000 Units SubCUTAneous Q MWF    aspirin  chewable tablet 81 mg  81 mg Per G Tube Daily    atorvastatin  (LIPITOR ) tablet 80 mg  80 mg Per G Tube QHS    busPIRone  (BUSPAR ) tablet 5 mg  5 mg Per G Tube BID    clopidogrel  (PLAVIX ) tablet 75 mg  75 mg Per G Tube Daily    DULoxetine  (CYMBALTA ) extended release capsule 60 mg  60 mg  Per G Tube Daily    famotidine  (PEPCID ) tablet 20 mg  20 mg Per G Tube Daily    sennosides-docusate sodium  (SENOKOT-S) 8.6-50 MG tablet 1 tablet  1 tablet Per G Tube BID    vitamin B-6 (PYRIDOXINE )  tablet 25 mg  25 mg Per G Tube Q6H    ceFAZolin  (ANCEF ) 1,000 mg in sterile water  10 mL IV syringe  1,000 mg IntraVENous Daily    0.9 % sodium chloride  infusion   IntraVENous PRN    carvedilol  (COREG ) tablet 25 mg  25 mg Per G Tube BID WC    scopolamine  (TRANSDERM-SCOP) transdermal patch 1 patch  1 patch TransDERmal Q72H    albumin  human 25% IV solution 25 g  25 g IntraVENous PRN    0.9 % sodium chloride  infusion   IntraVENous PRN    heparin  (porcine) injection 5,000 Units  5,000 Units SubCUTAneous 3 times per day    insulin  lispro (HUMALOG ,ADMELOG ) injection vial 0-8 Units  0-8 Units SubCUTAneous 4x Daily AC & HS            Lab Review:     Recent Labs     08/22/24  0454 08/23/24  0239 08/24/24  0758   WBC 8.0 6.9 6.1   HGB 11.6 11.2* 11.7   HCT 36.8 35.7 36.7   PLT 456* 425* 451*     Recent Labs     08/22/24  0454 08/23/24  0239 08/24/24  0502 08/24/24  0758   NA 130* 136 133*  --    K 3.8 4.1 3.9  --    CL 88* 95* 91*  --    CO2 28 32* 30*  --    BUN 27* 14 31*  --    MG 2.2  --   --  2.3   PHOS 3.3  --   --  4.3   ALT <5* <5*  --   --      No components found for: [facility]        "

## 2024-08-24 NOTE — Progress Notes (Addendum)
"  1210 EKG per order as patient arrived back to unit after HD. RRT called d/t EKG findings. EKG sent to attending. Code STEMI called. RRT RN attempted ultrasound guided IV. Per attending, hold on Hohns, ok for no IV access. Patient resting in bed, A&Ox3, used bedside interpreter. Denies any chest pain reports some SOB, on rm air. Vitals documented/stable. Tube feedings resumed. No need for telemetry monitoring. No question or concerns presented at this time.   "

## 2024-08-24 NOTE — Flowsheet Note (Signed)
"  Patient became dizzy upon standing after having BM and fell to floor. Found laying on back in room. Denies head strike or LOC. Denies CP, SOB, diff breathing. Confirms dizziness. BS 146. Hypotensive - 74/43. Received dialysis earlier in the day; however, has been normotensive since. EKG w/ NSR, T wave abnormality in lateral leads, Qtc 494. Continue to hold Qtc prolonging meds. No fluids given HD. Run of albumin  ordered. Check orthostatics. Check CBC and CMP. Bedrest. Animator. Remote tele. Monitor for hemodynamic instability.      ADDENDUM: Patient hypotensive w/ continued dizziness despite albumin  infusion. Neo gtt ordered. Additionally, virtual sitter states patient did hit her head. Will check CT head. Transfer to SD for continued monitoring.   "

## 2024-08-25 LAB — CBC
Hematocrit: 33.4 % — ABNORMAL LOW (ref 35.0–47.0)
Hemoglobin: 10.5 g/dL — ABNORMAL LOW (ref 11.5–16.0)
MCH: 30.5 pg (ref 26.0–34.0)
MCHC: 31.4 g/dL (ref 30.0–36.5)
MCV: 97.1 FL (ref 80.0–99.0)
MPV: 8.9 FL (ref 8.9–12.9)
Nucleated RBCs: 0 /100{WBCs}
Platelets: 378 K/uL (ref 150–400)
RBC: 3.44 M/uL — ABNORMAL LOW (ref 3.80–5.20)
RDW: 16.5 % — ABNORMAL HIGH (ref 11.5–14.5)
WBC: 10.5 K/uL (ref 3.6–11.0)
nRBC: 0 K/uL (ref 0.00–0.01)

## 2024-08-25 LAB — EKG 12-LEAD
Atrial Rate: 113 {beats}/min
P Axis: 49 degrees
P-R Interval: 132 ms
Q-T Interval: 360 ms
QRS Duration: 90 ms
QTc Calculation (Bazett): 493 ms
R Axis: -79 degrees
T Axis: 82 degrees
Ventricular Rate: 113 {beats}/min

## 2024-08-25 LAB — COMPREHENSIVE METABOLIC PANEL
ALT: 6 U/L — ABNORMAL LOW (ref 10–35)
AST: 42 U/L — ABNORMAL HIGH (ref 10–35)
Albumin/Globulin Ratio: 0.9 — ABNORMAL LOW (ref 1.1–2.2)
Albumin: 2.6 g/dL — ABNORMAL LOW (ref 3.5–5.2)
Alk Phosphatase: 124 U/L — ABNORMAL HIGH (ref 35–104)
Anion Gap: 11 mmol/L (ref 2–14)
BUN/Creatinine Ratio: 4 — ABNORMAL LOW (ref 12–20)
BUN: 16 mg/dL (ref 6–20)
CO2: 29 mmol/L (ref 20–29)
Calcium: 7.6 mg/dL — ABNORMAL LOW (ref 8.6–10.0)
Chloride: 94 mmol/L — ABNORMAL LOW (ref 98–107)
Creatinine: 3.89 mg/dL — ABNORMAL HIGH (ref 0.60–1.00)
Est, Glom Filt Rate: 14 ml/min/1.73m2 — ABNORMAL LOW (ref >59)
Globulin: 3 g/dL (ref 2.0–4.0)
Glucose: 144 mg/dL — ABNORMAL HIGH (ref 65–100)
Potassium: 3.6 mmol/L (ref 3.5–5.1)
Sodium: 134 mmol/L — ABNORMAL LOW (ref 136–145)
Total Bilirubin: 0.2 mg/dL (ref 0.0–1.2)
Total Protein: 5.6 g/dL — ABNORMAL LOW (ref 6.4–8.3)

## 2024-08-25 LAB — POCT GLUCOSE
POC Glucose: 146 mg/dL — ABNORMAL HIGH (ref 65–117)
POC Glucose: 185 mg/dL — ABNORMAL HIGH (ref 65–117)
POC Glucose: 148 mg/dL — ABNORMAL HIGH (ref 65–117)
POC Glucose: 218 mg/dL — ABNORMAL HIGH (ref 65–117)
POC Glucose: 185 mg/dL — ABNORMAL HIGH (ref 65–117)

## 2024-08-25 MED ORDER — PHENYLEPHRINE HCL 10 MG/ML SOLN (MIXTURES ONLY)
10 | INTRAVENOUS | Status: DC
Start: 2024-08-25 — End: 2024-08-25

## 2024-08-25 MED ORDER — ALBUMIN HUMAN 25 % IV SOLN
25 | Freq: Once | INTRAVENOUS | Status: AC
Start: 2024-08-25 — End: 2024-08-24
  Administered 2024-08-25: 01:00:00 25 g via INTRAVENOUS

## 2024-08-25 MED ORDER — ONDANSETRON HCL 4 MG/2ML IJ SOLN
4 | Freq: Four times a day (QID) | INTRAMUSCULAR | Status: DC | PRN
Start: 2024-08-25 — End: 2024-08-26

## 2024-08-25 MED FILL — VITAMIN B-6 50 MG PO TABS: 50 mg | ORAL | Qty: 1 | Fill #0

## 2024-08-25 MED FILL — BUSPIRONE HCL 10 MG PO TABS: 10 mg | ORAL | Qty: 1 | Fill #0

## 2024-08-25 MED FILL — HEPARIN SODIUM (PORCINE) 5000 UNIT/ML IJ SOLN: 5000 [IU]/mL | INTRAMUSCULAR | Qty: 1 | Fill #0

## 2024-08-25 MED FILL — INSULIN LISPRO 100 UNIT/ML IJ SOLN: 100 [IU]/mL | INTRAMUSCULAR | Qty: 2 | Fill #0

## 2024-08-25 MED FILL — LANSOPRAZOLE 15 MG PO TBDD: 15 mg | ORAL | Qty: 2 | Fill #0

## 2024-08-25 MED FILL — PROCHLORPERAZINE 25 MG RE SUPP: 25 mg | RECTAL | Qty: 1 | Fill #0

## 2024-08-25 MED FILL — ATORVASTATIN CALCIUM 20 MG PO TABS: 20 mg | ORAL | Qty: 4 | Fill #0

## 2024-08-25 MED FILL — SODIUM BICARBONATE 650 MG PO TABS: 650 mg | ORAL | Qty: 1 | Fill #0

## 2024-08-25 MED FILL — HYDRALAZINE HCL 25 MG PO TABS: 25 mg | ORAL | Qty: 4 | Fill #0

## 2024-08-25 MED FILL — ONDANSETRON HCL 4 MG/2ML IJ SOLN: 4 MG/2ML | INTRAMUSCULAR | Qty: 2 | Fill #0

## 2024-08-25 MED FILL — METOCLOPRAMIDE HCL 10 MG/10ML PO SOLN: 10 mg/mL | ORAL | Qty: 10 | Fill #0

## 2024-08-25 MED FILL — METOCLOPRAMIDE HCL 5 MG/5ML PO SOLN: 5 mg/mL | ORAL | Qty: 10 | Fill #0

## 2024-08-25 MED FILL — ASPIRIN 81 MG PO CHEW: 81 mg | ORAL | Qty: 1 | Fill #0

## 2024-08-25 MED FILL — ACETAMINOPHEN EXTRA STRENGTH 500 MG PO TABS: 500 mg | ORAL | Qty: 2 | Fill #0

## 2024-08-25 MED FILL — TRAMADOL HCL 50 MG PO TABS: 50 mg | ORAL | Qty: 1 | Fill #0

## 2024-08-25 MED FILL — CARVEDILOL 12.5 MG PO TABS: 12.5 mg | ORAL | Qty: 2 | Fill #0

## 2024-08-25 MED FILL — DICYCLOMINE HCL 10 MG PO CAPS: 10 mg | ORAL | Qty: 1 | Fill #0

## 2024-08-25 MED FILL — SENNOSIDES-DOCUSATE SODIUM 8.6-50 MG PO TABS: 8.6-50 mg | ORAL | Qty: 1 | Fill #0

## 2024-08-25 MED FILL — FAMOTIDINE 20 MG PO TABS: 20 mg | ORAL | Qty: 1 | Fill #0

## 2024-08-25 MED FILL — CEFAZOLIN SODIUM 1 G IJ SOLR: 1 g | INTRAMUSCULAR | Qty: 1000 | Fill #0

## 2024-08-25 MED FILL — LANTUS 100 UNIT/ML SC SOLN: 100 [IU]/mL | SUBCUTANEOUS | Qty: 10 | Fill #0

## 2024-08-25 MED FILL — METOCLOPRAMIDE HCL 5 MG/5ML PO SOLN: 5 mg/mL | ORAL | Qty: 5 | Fill #0

## 2024-08-25 MED FILL — DULOXETINE HCL 30 MG PO CPEP: 30 mg | ORAL | Qty: 2 | Fill #0

## 2024-08-25 MED FILL — SEVELAMER CARBONATE 0.8 G PO PACK: 0.8 g | ORAL | Qty: 1 | Fill #0

## 2024-08-25 MED FILL — ALBUMIN HUMAN 25 % IV SOLN: 25 % | INTRAVENOUS | Qty: 100 | Fill #0

## 2024-08-25 MED FILL — SODIUM CHLORIDE 0.9 % IV SOLN: 0.9 % | INTRAVENOUS | Qty: 250 | Fill #0

## 2024-08-25 MED FILL — SCOPOLAMINE 1 MG/3DAYS TD PT72: 1 MG/3DAYS | TRANSDERMAL | Qty: 1 | Fill #0

## 2024-08-25 MED FILL — CLOPIDOGREL BISULFATE 75 MG PO TABS: 75 mg | ORAL | Qty: 1 | Fill #0

## 2024-08-25 MED FILL — VITAMIN B-6 50 MG PO TABS: 50 mg | ORAL | Qty: 0.5 | Fill #0

## 2024-08-25 NOTE — Progress Notes (Signed)
"  Pt allowed 1 popsicle by provider per day. Pt had one popsicle at 11am. Despite being educated on the effects of eating food pt seen eating another popsicle.     Provider notified.   "

## 2024-08-25 NOTE — Progress Notes (Addendum)
 "    Hospitalist Progress Note      NAME:  Melinda [name]   DOB:  [birth year]  MRM:  [id number]    Date/Time: 08/25/2024  7:45 AM           Assessment / Plan:     43 year old female with ESRD, type 2 diabetes, hypertension, admitted 08/06/24 with acute on chronic vomiting and abdominal pain likely secondary to gastroparesis.     Gastroparesis with emesis abdominal pain s/p GJ tube  POA, acute on chronic. Reason for admission. Patient has chronic intractable N/V which lead to GJ placement, but sxs have continued. Normal abdominal imaging. GJ with correct placement. Evaluated by GI, sxs will be difficult to treat & PO intake will lead to continued N/V. It has been explained to patient that GJ is past ligament of treitz, GJ feeds are not the cause of her sxs, and feeds will provide nutrition even in setting of emesis. Recommend continued tube feeds; however patient has been noncompliant with plan.    - KUB (11/12) with proper GJ positioning. Continues to be noncompliant with NPO & ongoing N/V; however willing to stay on tube feeds at this point. Working on TF trial through the weekend to see if she can meet nutritional needs  - GI re-evaluated. Recommend continue current plan with TF & bowel regimen. They do not recommend parenteral nutrition & cannot manage this in the OP setting. Cancelled order for Hohn given no other IV med needs at this point.   - Diet: Strict NPO. Maintain nutrition through tube feeds as possible. Dietician following. If she fails to maintain tube feeds, will need to readdress parenteral nutrition with GI/dietitian as all other options exhausted   - PPI, Pepcid , scopolamine  patch   - Reglan  scheduled   - Zofran  PRN, caution given Qtc   - Bowel regimen, goal 1 BM per day. Suppository PRN if no BM  - Reduce opioids, switch to tramadol  PRN BID    Hypertension   Labile BP  Vasovagal Syncope   POA. Chronic HTN with elevated Bps during earlier hospital course. BP labile, and hypotensive yesterday  following HD. Ultimately resulting in suspected vasovagal syncopal episode 11/14. CT head negative. BP improved with albumin   - BP improved this AM. Dizziness resolved.   - Continue home Coreg  and hydralazine  with parameters, will hold AM dose and monitor to see if dosing later today is needed. Hold < 120/80  - Nifedipine  discontinued (cannot be crushed). Switched to clonidine  patch. Will discontinue this for now given hypotension/syncope     Sepsis secondary to MSSA bacteremia  Evaluated by ID. Low suspicion however cannot rule out AV graft infected hematoma.  Vascular surgery followed care, no intervention planned   - Per ID, cefazolin  2 g dosed post HD Mondays and Wednesdays and 3 g dosed post HD on Fridays; however, it appears cefazolin  is ordered 1g QD which she has not been getting due lack of access & no provider was been notified that this medication was not being given with HD or otherwise. Had made attempts to get Hohn catheter 11/12 - 11/14 but this was not able to be completed for various reasons (ongoing vomiting, not NPO, at HD, etc.). Nursing attempting to get pIV again today, but this has been unsuccessful for several days. Discussed with IR, Dr. Doree and he states that [facility] cannot be placed on the weekends. He also states that IR doesn't place central lines after hours, and it would be anesthesia that  places the lines. ESRD patient limits access options; however as this is a critical medication need, will attempt to get central line if pIV access is not able to be achieved.     Alpha Streptococcus isolated from urine   Evaluated by ID. Likely colonization   - Abx as above     Prolonged QT  Qtc 503. 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 Phosphorus now normal  - Continue sevelamer      ESRD  On HD  - Nephrology following     GERD  Chronic.   - Continue Pepcid  and PPI     T2DM with gastroparesis.    POA, chronic.  Last A1c of 6.1 on 9/17.    - Sliding scale insulin   - BG rising with tube feeds, started on lantus . Monitor and titrate PRN      Chronic Back pain   No e/o discitis, noted DDD and Moderate to severe canal stenosis at C4-5 and moderate canal stenosis at C3-C4   - Duloxetine    - Pain control PRN. Caution with opioids given gastroparesis. 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 Evaluated by psychiatry (11/7), their assessment was not consistent with self-induced vomiting, eating disorder, or psychotic process   - Duloxetine  and BuSpar .  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   - Continue plavix  and ASA  - Continue statin      Anemia  Mild and intermittent during hospital stay from 9 - 12. In setting of acute illness and ESRD  - Stable   - ESA per nephrology     Fentanyl  positive  Seen on drug screen. May be a false positive in the setting of Benadryl  use. Patient adamantly denies use.        #BMI (Calculated): 24.3    I have personally reviewed the radiographs, laboratory data in Epic and decisions and statements above are based partially on this personal interpretation.                 Care Plan discussed with: Patient, Family, Care Manager, Nursing Staff, and Consultant/Specialist    Discussed:  Care Plan and D/C Planning    Prophylaxis:  Hep SQ    Disposition:  Home w/Family    Total time spent with patient care: 30 Minutes.  **I personally saw and examined the patient during this time period**           ___________________________________________________    Attending Physician: Thena cross, MD        Subjective:     Chief Complaint:  Doing well today.  No further dizziness. No HA.     Patient denies having an PO intake by mouth; however nursing notes she has had PO intake brought in by her family. She continues to have N/V, but no vomitus is appearing as her tube feed solution.     Patient has maintained tube feeds since yesterday which is an improvement.     Patient thinks her hunger, abdominal pain, and nausea are better today.  ROS:  12+ ROS reviewed with patient and negative with exception of above           Objective:       Vitals:          Last 24hrs VS reviewed since prior progress note. Most recent are:    Vitals:    08/25/24 0709   BP: 108/64   Pulse: 78   Resp: 14   Temp:    SpO2: 98%     SpO2 Readings from Last 6 Encounters:   08/25/24 98%   07/20/24 94%   07/11/24 98%   07/09/24 98%   07/05/24 95%   07/02/24 95%          Intake/Output Summary (Last 24 hours) at 08/25/2024 0745  Last data filed at 08/25/2024 0500  Gross per 24 hour   Intake 1155 ml   Output 700 ml   Net 455 ml          Exam:     Physical Exam:     Physical Exam  Constitutional:       General: She is not in acute distress.     Appearance: She is not ill-appearing, toxic-appearing or diaphoretic.      Comments: Disheveled. Malodorous.    HENT:      Head: Normocephalic and atraumatic.      Right Ear: External ear normal.      Left Ear: External ear normal.      Nose: Nose normal.      Mouth/Throat:      Pharynx: Oropharynx is clear.   Eyes:      Extraocular Movements: Extraocular movements intact.      Conjunctiva/sclera: Conjunctivae normal.   Cardiovascular:      Rate and Rhythm: Normal rate.   Pulmonary:      Effort: Pulmonary effort is normal.   Abdominal:      General: Abdomen is flat. There is no distension.      Palpations: Abdomen is soft.      Tenderness: There is abdominal tenderness (generalized).      Comments: Vomiting during patient interview, clear liquid c/w water    Musculoskeletal:         General: No deformity.   Skin:     General: Skin is warm and dry.   Neurological:       General: No focal deficit present.   Psychiatric:         Mood and Affect: Mood normal.      Comments: Flat affect.               Medications Reviewed: (see below)    Lab Data Reviewed: (see below)    ______________________________________________________________________    Medications:     Current Facility-Administered Medications   Medication Dose Route Frequency    [Held by provider] ondansetron  (ZOFRAN ) injection 4 mg  4 mg IntraVENous Q6H PRN    traMADol  (ULTRAM ) tablet 50 mg  50 mg PEG Tube Q12H PRN    acetaminophen  (TYLENOL ) tablet 1,000 mg  1,000 mg PEG Tube Q6H    insulin  glargine (LANTUS ) injection vial 10 Units  10 Units SubCUTAneous Nightly    insulin  lispro (HUMALOG ,ADMELOG ) injection vial 0-8 Units  0-8 Units SubCUTAneous 4 times per day    phenylephrine  (NEO-SYNEPHRINE) 50 mg in sodium chloride  0.9 % 250 mL infusion (Vial2Bag)  10-300 mcg/min IntraVENous Continuous    [Held by provider] cloNIDine  (CATAPRES ) 0.2 MG/24HR 1 patch  1 patch TransDERmal Weekly  lansoprazole  (PREVACID  SOLUTAB) disintegrating tablet 30 mg  30 mg Per G Tube BID    metoclopramide  (REGLAN ) 5 MG/5ML solution 5 mg  5 mg Per G Tube TID AC    prochlorperazine  (COMPAZINE ) suppository 25 mg  25 mg Rectal Q12H PRN    sodium chloride  flush 0.9 % injection 5-40 mL  5-40 mL IntraVENous 2 times per day    sodium chloride  flush 0.9 % injection 5-40 mL  5-40 mL IntraVENous PRN    0.9 % sodium chloride  infusion   IntraVENous PRN    lidocaine  PF 1 % injection 50 mg  50 mg IntraDERmal Once    dextrose  bolus 10% 125 mL  125 mL IntraVENous PRN    Or    dextrose  bolus 10% 250 mL  250 mL IntraVENous PRN    glucagon  injection 1 mg  1 mg SubCUTAneous PRN    dextrose  10 % infusion   IntraVENous Continuous PRN    hydrALAZINE  (APRESOLINE ) tablet 100 mg  100 mg Per G Tube TID    glucose chewable tablet 16 g  4 tablet Per G Tube PRN    labetalol  (NORMODYNE ;TRANDATE ) injection 10 mg  10 mg IntraVENous Q4H PRN    dicyclomine  (BENTYL ) capsule 10 mg   10 mg PEG Tube TID AC    polyethylene glycol (GLYCOLAX ) packet 17 g  17 g Per G Tube Daily PRN    sevelamer  (RENVELA ) packet 0.8 g  0.8 g Per G Tube TID WC    sodium bicarbonate  tablet 650 mg  650 mg PEG Tube TID    bisacodyl  (DULCOLAX) suppository 10 mg  10 mg Rectal Daily PRN    [Held by provider] epoetin  alfa-epbx (RETACRIT ) injection 10,000 Units  10,000 Units SubCUTAneous Q MWF    aspirin  chewable tablet 81 mg  81 mg Per G Tube Daily    atorvastatin  (LIPITOR ) tablet 80 mg  80 mg Per G Tube QHS    busPIRone  (BUSPAR ) tablet 5 mg  5 mg Per G Tube BID    clopidogrel  (PLAVIX ) tablet 75 mg  75 mg Per G Tube Daily    DULoxetine  (CYMBALTA ) extended release capsule 60 mg  60 mg Per G Tube Daily    famotidine  (PEPCID ) tablet 20 mg  20 mg Per G Tube Daily    sennosides-docusate sodium  (SENOKOT-S) 8.6-50 MG tablet 1 tablet  1 tablet Per G Tube BID    vitamin B-6 (PYRIDOXINE ) tablet 25 mg  25 mg Per G Tube Q6H    ceFAZolin  (ANCEF ) 1,000 mg in sterile water  10 mL IV syringe  1,000 mg IntraVENous Daily    0.9 % sodium chloride  infusion   IntraVENous PRN    carvedilol  (COREG ) tablet 25 mg  25 mg Per G Tube BID WC    scopolamine  (TRANSDERM-SCOP) transdermal patch 1 patch  1 patch TransDERmal Q72H    albumin  human 25% IV solution 25 g  25 g IntraVENous PRN    0.9 % sodium chloride  infusion   IntraVENous PRN    heparin  (porcine) injection 5,000 Units  5,000 Units SubCUTAneous 3 times per day            Lab Review:     Recent Labs     08/23/24  0239 08/24/24  0758 08/24/24  2009   WBC 6.9 6.1 10.5   HGB 11.2* 11.7 10.5*   HCT 35.7 36.7 33.4*   PLT 425* 451* 378     Recent Labs     08/23/24  0239  08/24/24  0502 08/24/24  0758 08/24/24  2009   NA 136 133*  --  134*   K 4.1 3.9  --  3.6   CL 95* 91*  --  94*   CO2 32* 30*  --  29   BUN 14 31*  --  16   MG  --   --  2.3  --    PHOS  --   --  4.3  --    ALT <5*  --   --  6*     No components found for: [facility]        "

## 2024-08-25 NOTE — Plan of Care (Signed)
 "  Problem: Chronic Conditions and Co-morbidities  Goal: Patient's chronic conditions and co-morbidity symptoms are monitored and maintained or improved  08/25/2024 1214 by Rosabel Maxon, RN  Outcome: Progressing  Flowsheets (Taken 08/25/2024 0800)  Care Plan - Patient's Chronic Conditions and Co-Morbidity Symptoms are Monitored and Maintained or Improved:   Monitor and assess patient's chronic conditions and comorbid symptoms for stability, deterioration, or improvement   Collaborate with multidisciplinary team to address chronic and comorbid conditions and prevent exacerbation or deterioration   Update acute care plan with appropriate goals if chronic or comorbid symptoms are exacerbated and prevent overall improvement and discharge  08/25/2024 1214 by Rosabel Maxon, RN  Outcome: Progressing  Flowsheets (Taken 08/25/2024 0800)  Care Plan - Patient's Chronic Conditions and Co-Morbidity Symptoms are Monitored and Maintained or Improved:   Monitor and assess patient's chronic conditions and comorbid symptoms for stability, deterioration, or improvement   Collaborate with multidisciplinary team to address chronic and comorbid conditions and prevent exacerbation or deterioration   Update acute care plan with appropriate goals if chronic or comorbid symptoms are exacerbated and prevent overall improvement and discharge  08/25/2024 0456 by Sonda Ronal Fellows, RN  Outcome: Not Progressing     Problem: Discharge Planning  Goal: Discharge to home or other facility with appropriate resources  08/25/2024 1214 by Rosabel Maxon, RN  Outcome: Progressing  Flowsheets (Taken 08/25/2024 0800)  Discharge to home or other facility with appropriate resources:   Identify barriers to discharge with patient and caregiver   Identify discharge learning needs (meds, wound care, etc)   Arrange for interpreters to assist at discharge as needed  08/25/2024 1214 by Rosabel Maxon, RN  Outcome: Progressing  Flowsheets (Taken 08/25/2024  0800)  Discharge to home or other facility with appropriate resources:   Identify barriers to discharge with patient and caregiver   Identify discharge learning needs (meds, wound care, etc)   Arrange for interpreters to assist at discharge as needed  08/25/2024 0456 by Sonda Ronal Fellows, RN  Outcome: Not Progressing     Problem: Pain  Goal: Verbalizes/displays adequate comfort level or baseline comfort level  08/25/2024 1214 by Rosabel Maxon, RN  Outcome: Progressing  08/25/2024 1214 by Rosabel Maxon, RN  Outcome: Progressing  08/25/2024 0456 by Sonda Ronal Fellows, RN  Outcome: Not Progressing     Problem: Safety - Adult  Goal: Free from fall injury  08/25/2024 1214 by Rosabel Maxon, RN  Outcome: Progressing  08/25/2024 1214 by Rosabel Maxon, RN  Outcome: Progressing  08/25/2024 0456 by Sonda Ronal Fellows, RN  Outcome: Not Progressing     Problem: Nutrition Deficit:  Goal: Optimize nutritional status  08/25/2024 1214 by Rosabel Maxon, RN  Outcome: Progressing  08/25/2024 1214 by Rosabel Maxon, RN  Outcome: Progressing  08/25/2024 0456 by Sonda Ronal Fellows, RN  Outcome: Not Progressing     Problem: Gastrointestinal - Adult  Goal: Minimal or absence of nausea and vomiting  08/25/2024 1214 by Rosabel Maxon, RN  Outcome: Progressing  Flowsheets (Taken 08/25/2024 0800)  Minimal or absence of nausea and vomiting: Maintain NPO status until nausea and vomiting are resolved  08/25/2024 1214 by Rosabel Maxon, RN  Outcome: Progressing  Flowsheets (Taken 08/25/2024 0800)  Minimal or absence of nausea and vomiting: Maintain NPO status until nausea and vomiting are resolved  08/25/2024 0456 by Sonda Ronal Fellows, RN  Outcome: Not Progressing  Flowsheets (Taken 08/24/2024 2000 by Adolm Ireland, RN)  Minimal or absence of nausea and vomiting: Maintain NPO status until  nausea and vomiting are resolved  Goal: Maintains or returns to baseline bowel function  08/25/2024 1214 by Rosabel Maxon, RN  Outcome: Progressing  Flowsheets  (Taken 08/25/2024 0800)  Maintains or returns to baseline bowel function: Assess bowel function  08/25/2024 1214 by Rosabel Maxon, RN  Outcome: Progressing  Flowsheets (Taken 08/25/2024 0800)  Maintains or returns to baseline bowel function: Assess bowel function  08/25/2024 0456 by Sonda Ronal Fellows, RN  Outcome: Progressing  Goal: Maintains adequate nutritional intake  08/25/2024 1214 by Rosabel Maxon, RN  Outcome: Progressing  Flowsheets (Taken 08/25/2024 0800)  Maintains adequate nutritional intake: Monitor intake and output, weight and lab values  08/25/2024 1214 by Rosabel Maxon, RN  Outcome: Progressing  Flowsheets (Taken 08/25/2024 0800)  Maintains adequate nutritional intake: Monitor intake and output, weight and lab values  08/25/2024 0456 by Sonda Ronal Fellows, RN  Outcome: Not Progressing     Problem: Skin/Tissue Integrity  Goal: Skin integrity remains intact  Description: 1.  Monitor for areas of redness and/or skin breakdown  2.  Assess vascular access sites hourly  3.  Every 4-6 hours minimum:  Change oxygen saturation probe site  4.  Every 4-6 hours:  If on nasal continuous positive airway pressure, respiratory therapy assess nares and determine need for appliance change or resting period  08/25/2024 1214 by Rosabel Maxon, RN  Outcome: Progressing  Flowsheets (Taken 08/25/2024 0800)  Skin Integrity Remains Intact: Monitor for areas of redness and/or skin breakdown  08/25/2024 1214 by Rosabel Maxon, RN  Outcome: Progressing  Flowsheets (Taken 08/25/2024 0800)  Skin Integrity Remains Intact: Monitor for areas of redness and/or skin breakdown  08/25/2024 0456 by Sonda Ronal Fellows, RN  Outcome: Progressing     Problem: Chronic Conditions and Co-morbidities  Goal: Patient's chronic conditions and co-morbidity symptoms are monitored and maintained or improved  08/25/2024 1214 by Rosabel Maxon, RN  Outcome: Progressing  Flowsheets (Taken 08/25/2024 0800)  Care Plan - Patient's Chronic Conditions and  Co-Morbidity Symptoms are Monitored and Maintained or Improved:   Monitor and assess patient's chronic conditions and comorbid symptoms for stability, deterioration, or improvement   Collaborate with multidisciplinary team to address chronic and comorbid conditions and prevent exacerbation or deterioration   Update acute care plan with appropriate goals if chronic or comorbid symptoms are exacerbated and prevent overall improvement and discharge  08/25/2024 1214 by Rosabel Maxon, RN  Outcome: Progressing  Flowsheets (Taken 08/25/2024 0800)  Care Plan - Patient's Chronic Conditions and Co-Morbidity Symptoms are Monitored and Maintained or Improved:   Monitor and assess patient's chronic conditions and comorbid symptoms for stability, deterioration, or improvement   Collaborate with multidisciplinary team to address chronic and comorbid conditions and prevent exacerbation or deterioration   Update acute care plan with appropriate goals if chronic or comorbid symptoms are exacerbated and prevent overall improvement and discharge  08/25/2024 0456 by Sonda Ronal Fellows, RN  Outcome: Not Progressing     Problem: Discharge Planning  Goal: Discharge to home or other facility with appropriate resources  08/25/2024 1214 by Rosabel Maxon, RN  Outcome: Progressing  Flowsheets (Taken 08/25/2024 0800)  Discharge to home or other facility with appropriate resources:   Identify barriers to discharge with patient and caregiver   Identify discharge learning needs (meds, wound care, etc)   Arrange for interpreters to assist at discharge as needed  08/25/2024 1214 by Rosabel Maxon, RN  Outcome: Progressing  Flowsheets (Taken 08/25/2024 0800)  Discharge to home or other facility with appropriate resources:  Identify barriers to discharge with patient and caregiver   Identify discharge learning needs (meds, wound care, etc)   Arrange for interpreters to assist at discharge as needed  08/25/2024 0456 by Sonda Ronal Fellows, RN  Outcome:  Not Progressing     Problem: Pain  Goal: Verbalizes/displays adequate comfort level or baseline comfort level  08/25/2024 1214 by Rosabel Maxon, RN  Outcome: Progressing  08/25/2024 1214 by Rosabel Maxon, RN  Outcome: Progressing  08/25/2024 0456 by Sonda Ronal Fellows, RN  Outcome: Not Progressing     Problem: Safety - Adult  Goal: Free from fall injury  08/25/2024 1214 by Rosabel Maxon, RN  Outcome: Progressing  08/25/2024 1214 by Rosabel Maxon, RN  Outcome: Progressing  08/25/2024 0456 by Sonda Ronal Fellows, RN  Outcome: Not Progressing     Problem: Nutrition Deficit:  Goal: Optimize nutritional status  08/25/2024 1214 by Rosabel Maxon, RN  Outcome: Progressing  08/25/2024 1214 by Rosabel Maxon, RN  Outcome: Progressing  08/25/2024 0456 by Sonda Ronal Fellows, RN  Outcome: Not Progressing     Problem: Gastrointestinal - Adult  Goal: Minimal or absence of nausea and vomiting  08/25/2024 1214 by Rosabel Maxon, RN  Outcome: Progressing  Flowsheets (Taken 08/25/2024 0800)  Minimal or absence of nausea and vomiting: Maintain NPO status until nausea and vomiting are resolved  08/25/2024 1214 by Rosabel Maxon, RN  Outcome: Progressing  Flowsheets (Taken 08/25/2024 0800)  Minimal or absence of nausea and vomiting: Maintain NPO status until nausea and vomiting are resolved  08/25/2024 0456 by Sonda Ronal Fellows, RN  Outcome: Not Progressing  Flowsheets (Taken 08/24/2024 2000 by Adolm Ireland, RN)  Minimal or absence of nausea and vomiting: Maintain NPO status until nausea and vomiting are resolved  Goal: Maintains adequate nutritional intake  08/25/2024 1214 by Rosabel Maxon, RN  Outcome: Progressing  Flowsheets (Taken 08/25/2024 0800)  Maintains adequate nutritional intake: Monitor intake and output, weight and lab values  08/25/2024 1214 by Rosabel Maxon, RN  Outcome: Progressing  Flowsheets (Taken 08/25/2024 0800)  Maintains adequate nutritional intake: Monitor intake and output, weight and lab values  08/25/2024 0456  by Sonda Ronal Fellows, RN  Outcome: Not Progressing     "

## 2024-08-25 NOTE — Consults (Signed)
 Session ID: [id number]  Session Duration: 6 minutes  Language: Spanish  Interpreter ID: #[id number]  Interpreter Name: Wal[place]

## 2024-08-25 NOTE — Flowsheet Note (Signed)
"  Patient asking to leave AMA. Reports feeling better - No abd pain, nausea, vomiting. Patient encouraged to stay. Notably, patient now has IV access and is receiving scheduled Ancef . Patient is willing to stay if she can have popsicles. I am agreeable to this. Patient instructed to stop eating popsicle if she develops any pain, nausea, vomiting. Patient verbalized understanding. Patient would like to discharge first thing in AM. Attending aware.  "

## 2024-08-25 NOTE — Progress Notes (Addendum)
"  0900-Dr. 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    0930-ICU Charge [facility]) attempted to get IV with ultrasound with no success.    1530-Rad RN [facility]) attempted to get IV with ultrasound with no success.      1535-Spoke with Dr. Larina on the phone who is trying to find someone to place a central line.  She spoke with IR who said that Anesthesia is the contact on the weekends.      1540-Dr. Larina has been unable to reach Anesthesia.  Nursing Supervisor trying to reach them.    1545-Blood Cultures ordered.  Dr. Larina wrote in order to draw them if a new Central Line is obtained (since unable to get vein access).  "

## 2024-08-25 NOTE — Progress Notes (Signed)
"  Ultrasound IV by  :  Procedure Note    Ultrasound IV education provided to patient. Opportunities for questions given.     Scanned right arm, attempted PIV x2 in left lower arm.    Scanned lower, and upper left arm no suitable vessels  seen at this time.     RONAL FORBES ALERT, RN   "

## 2024-08-25 NOTE — Plan of Care (Signed)
"    Problem: Pain  Goal: Verbalizes/displays adequate comfort level or baseline comfort level  08/25/2024 2347 by Dewitt Arland CROME, RN  Outcome: Progressing  08/25/2024 1214 by Rosabel Maxon, RN  Outcome: Progressing     "

## 2024-08-26 LAB — POCT GLUCOSE
POC Glucose: 129 mg/dL — ABNORMAL HIGH (ref 65–117)
POC Glucose: 195 mg/dL — ABNORMAL HIGH (ref 65–117)
POC Glucose: 221 mg/dL — ABNORMAL HIGH (ref 65–117)
POC Glucose: 285 mg/dL — ABNORMAL HIGH (ref 65–117)
POC Glucose: 298 mg/dL — ABNORMAL HIGH (ref 65–117)

## 2024-08-26 MED ORDER — ONDANSETRON 4 MG PO TBDP
4 | Freq: Three times a day (TID) | ORAL | Status: DC | PRN
Start: 2024-08-26 — End: 2024-08-28

## 2024-08-26 MED ORDER — CEFAZOLIN SODIUM 1 G IJ SOLR
1 | INTRAMUSCULAR | Status: DC
Start: 2024-08-26 — End: 2024-08-28
  Administered 2024-08-27: 23:00:00 2000 mg via INTRAVENOUS

## 2024-08-26 MED ORDER — PROCHLORPERAZINE EDISYLATE 10 MG/2ML IJ SOLN
10 | Freq: Once | INTRAMUSCULAR | Status: DC
Start: 2024-08-26 — End: 2024-08-28

## 2024-08-26 MED ORDER — DIAZEPAM 5 MG/ML IJ SOLN
5 | Freq: Once | INTRAMUSCULAR | Status: AC
Start: 2024-08-26 — End: 2024-08-26
  Administered 2024-08-26: 14:00:00 2.5 mg via INTRAVENOUS

## 2024-08-26 MED ORDER — ONDANSETRON HCL 4 MG/2ML IJ SOLN
4 | Freq: Three times a day (TID) | INTRAMUSCULAR | Status: DC | PRN
Start: 2024-08-26 — End: 2024-08-28
  Administered 2024-08-26: 14:00:00 4 mg via INTRAVENOUS

## 2024-08-26 MED ORDER — SODIUM CHLORIDE 0.9 % IV SOLN (ADDEASE)
0.9 | INTRAVENOUS | Status: DC
Start: 2024-08-26 — End: 2024-08-28

## 2024-08-26 MED ORDER — PROCHLORPERAZINE EDISYLATE 10 MG/2ML IJ SOLN
10 | Freq: Once | INTRAMUSCULAR | Status: AC
Start: 2024-08-26 — End: 2024-08-26
  Administered 2024-08-26: 08:00:00 5 mg via INTRAVENOUS

## 2024-08-26 MED ORDER — DIAZEPAM 5 MG/ML IJ SOLN
5 | Freq: Four times a day (QID) | INTRAMUSCULAR | Status: DC | PRN
Start: 2024-08-26 — End: 2024-08-28
  Administered 2024-08-26 – 2024-08-28 (×4): 2.5 mg via INTRAVENOUS

## 2024-08-26 MED FILL — VITAMIN B-6 50 MG PO TABS: 50 mg | ORAL | Qty: 1 | Fill #0

## 2024-08-26 MED FILL — SODIUM BICARBONATE 650 MG PO TABS: 650 mg | ORAL | Qty: 1 | Fill #0

## 2024-08-26 MED FILL — METOCLOPRAMIDE HCL 5 MG/5ML PO SOLN: 5 mg/mL | ORAL | Qty: 10 | Fill #0

## 2024-08-26 MED FILL — PROCHLORPERAZINE EDISYLATE 10 MG/2ML IJ SOLN: 10 MG/2ML | INTRAMUSCULAR | Qty: 2 | Fill #0

## 2024-08-26 MED FILL — LANSOPRAZOLE 15 MG PO TBDD: 15 mg | ORAL | Qty: 2 | Fill #0

## 2024-08-26 MED FILL — ACETAMINOPHEN EXTRA STRENGTH 500 MG PO TABS: 500 mg | ORAL | Qty: 2 | Fill #0

## 2024-08-26 MED FILL — ASPIRIN 81 MG PO CHEW: 81 mg | ORAL | Qty: 1 | Fill #0

## 2024-08-26 MED FILL — DIAZEPAM 5 MG/ML IJ SOLN: 5 mg/mL | INTRAMUSCULAR | Qty: 2 | Fill #0

## 2024-08-26 MED FILL — HEPARIN SODIUM (PORCINE) 5000 UNIT/ML IJ SOLN: 5000 [IU]/mL | INTRAMUSCULAR | Qty: 1 | Fill #0

## 2024-08-26 MED FILL — DULOXETINE HCL 30 MG PO CPEP: 30 mg | ORAL | Qty: 2 | Fill #0

## 2024-08-26 MED FILL — CARVEDILOL 12.5 MG PO TABS: 12.5 mg | ORAL | Qty: 2 | Fill #0

## 2024-08-26 MED FILL — LANTUS 100 UNIT/ML SC SOLN: 100 [IU]/mL | SUBCUTANEOUS | Qty: 10 | Fill #0

## 2024-08-26 MED FILL — FAMOTIDINE 20 MG PO TABS: 20 mg | ORAL | Qty: 1 | Fill #0

## 2024-08-26 MED FILL — INSULIN LISPRO 100 UNIT/ML IJ SOLN: 100 [IU]/mL | INTRAMUSCULAR | Qty: 4 | Fill #0

## 2024-08-26 MED FILL — HYDRALAZINE HCL 25 MG PO TABS: 25 mg | ORAL | Qty: 4 | Fill #0

## 2024-08-26 MED FILL — INSULIN LISPRO 100 UNIT/ML IJ SOLN: 100 [IU]/mL | INTRAMUSCULAR | Qty: 2 | Fill #0

## 2024-08-26 MED FILL — BUSPIRONE HCL 5 MG PO TABS: 5 mg | ORAL | Qty: 1 | Fill #0

## 2024-08-26 MED FILL — SENNOSIDES-DOCUSATE SODIUM 8.6-50 MG PO TABS: 8.6-50 mg | ORAL | Qty: 1 | Fill #0

## 2024-08-26 MED FILL — ATORVASTATIN CALCIUM 20 MG PO TABS: 20 mg | ORAL | Qty: 4 | Fill #0

## 2024-08-26 MED FILL — SEVELAMER CARBONATE 0.8 G PO PACK: 0.8 g | ORAL | Qty: 1 | Fill #0

## 2024-08-26 MED FILL — CEFAZOLIN SODIUM 1 G IJ SOLR: 1 g | INTRAMUSCULAR | Qty: 1000 | Fill #0

## 2024-08-26 MED FILL — ONDANSETRON HCL 4 MG/2ML IJ SOLN: 4 MG/2ML | INTRAMUSCULAR | Qty: 2 | Fill #0

## 2024-08-26 MED FILL — DICYCLOMINE HCL 10 MG PO CAPS: 10 mg | ORAL | Qty: 1 | Fill #0

## 2024-08-26 MED FILL — CLOPIDOGREL BISULFATE 75 MG PO TABS: 75 mg | ORAL | Qty: 1 | Fill #0

## 2024-08-26 MED FILL — METOCLOPRAMIDE HCL 10 MG/10ML PO SOLN: 10 mg/mL | ORAL | Qty: 10 | Fill #0

## 2024-08-26 NOTE — Consults (Signed)
 Session ID: [id number]  Session Duration: 4 minutes  Language: Spanish  Interpreter ID: #[id number]  Interpreter Name: Randye

## 2024-08-26 NOTE — Plan of Care (Signed)
"    Problem: Chronic Conditions and Co-morbidities  Goal: Patient's chronic conditions and co-morbidity symptoms are monitored and maintained or improved  Outcome: Progressing     Problem: Discharge Planning  Goal: Discharge to home or other facility with appropriate resources  Outcome: Progressing     Problem: Pain  Goal: Verbalizes/displays adequate comfort level or baseline comfort level  Outcome: Progressing     Problem: Safety - Adult  Goal: Free from fall injury  Outcome: Progressing     Problem: Nutrition Deficit:  Goal: Optimize nutritional status  Outcome: Progressing     Problem: Gastrointestinal - Adult  Goal: Minimal or absence of nausea and vomiting  Outcome: Progressing  Goal: Maintains or returns to baseline bowel function  Outcome: Progressing  Goal: Maintains adequate nutritional intake  Outcome: Progressing     Problem: Gastrointestinal - Adult  Goal: Minimal or absence of nausea and vomiting  Outcome: Progressing  Goal: Maintains or returns to baseline bowel function  Outcome: Progressing  Goal: Maintains adequate nutritional intake  Outcome: Progressing     Problem: Skin/Tissue Integrity  Goal: Skin integrity remains intact  Description: 1.  Monitor for areas of redness and/or skin breakdown  2.  Assess vascular access sites hourly  3.  Every 4-6 hours minimum:  Change oxygen saturation probe site  4.  Every 4-6 hours:  If on nasal continuous positive airway pressure, respiratory therapy assess nares and determine need for appliance change or resting period  Outcome: Progressing  Flowsheets (Taken 08/26/2024 9177 by Laurey Search, RN)  Skin Integrity Remains Intact: Monitor for areas of redness and/or skin breakdown     "

## 2024-08-26 NOTE — Progress Notes (Addendum)
 "    Hospitalist Progress Note      NAME:  Melinda [name]   DOB:  05-24-1981  MRM:  [id number]    Date/Time: 08/26/2024  10:58 AM           Assessment / Plan:     43 year old female with ESRD, type 2 diabetes, hypertension, admitted 08/06/24 with acute on chronic vomiting and abdominal pain likely secondary to gastroparesis.    Addendum: Discussed with Dr. Jethava and [place]  [facility] (pharmacy). Pharm/nephro arranging for her to get cefazolin  with HD starting tomorrow per Dr. Levern (ID) recommended regimen so that she doesn't require IV access in case it is lost again. Cancelled Hohn.      Gastroparesis with emesis abdominal pain s/p GJ tube  POA, acute on chronic. Reason for admission. Patient has chronic intractable N/V which lead to GJ placement, but sxs have continued. Normal abdominal imaging. GJ with correct placement. Evaluated by GI, sxs will be difficult to treat & PO intake will lead to continued N/V. It has been explained to patient that GJ is past ligament of treitz, GJ feeds are not the cause of her sxs, and feeds will provide nutrition even in setting of emesis. Recommend continued tube feeds; however patient has been noncompliant with plan.    - KUB (11/12) with proper GJ positioning. Continues to be noncompliant with NPO & ongoing N/V; however willing to stay on tube feeds at this point. Working on TF trial through the weekend to see if she can meet nutritional needs. CM working to arrange OP TF, hopefully will be ready Mon/Tues   - GI re-evaluated. Recommend continue current plan with TF & bowel regimen. They do not recommend parenteral nutrition & cannot manage this in the OP setting.    - Diet: Strict NPO. Maintain nutrition through tube feeds as possible, she has been doing well with this through the weekend. Dietician following.   - PPI, Pepcid , scopolamine  patch   - Reglan  scheduled   - Zofran  PRN, caution given Qtc   - Bowel regimen, goal 1 BM per day. Suppository PRN if no BM  - Reduce  opioids, switch to tramadol  PRN BID    Hypertension   Labile BP  Vasovagal Syncope   POA. Chronic HTN with elevated Bps during earlier hospital course. BP labile, and hypotensive yesterday following HD. Ultimately resulting in suspected vasovagal syncopal episode 11/14. CT head negative. BP improved with albumin   - BP improved. Dizziness resolved.   - Continue home Coreg  and hydralazine  with parameters  - Nifedipine  discontinued (cannot be crushed). Switched to clonidine  patch. Will discontinue this for now given hypotension/syncope     Sepsis secondary to MSSA bacteremia  Evaluated by ID. Low suspicion however cannot rule out AV graft infected hematoma.  Vascular surgery followed care, no intervention planned   - Per ID, cefazolin  2 g dosed post HD Mondays and Wednesdays and 3 g dosed post HD on Fridays. Currently patient is on cefazolin  1g QD through IV. She is a very hard stick with inconsistent IV access & limited access options due to ESRD. I am discussing with pharm/nephrology to arrange for her to get her abx during HD sessions per Dr. Levern order so that IV access is not needed, [place]  Rudd & Dr. Jethava assisting. Until this can be definitely arranged, continue pIV (as placed per anesthesia) in right upper extremity. This not the most ideal access for vein preservation, but the only option we have available this weekend. If unable  to establish abx therapy through HD while she remains IP, would recommend that a Hohn is placed by IR tomorrow morning. A order for this placed.     Alpha Streptococcus isolated from urine   Evaluated by ID. Likely colonization   - Abx as above     Prolonged QT  Qtc 503. 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 Phosphorus now normal  - Continue sevelamer      ESRD  On HD  - Nephrology following     GERD  Chronic.   - Continue Pepcid  and PPI     T2DM with gastroparesis.   POA,  chronic.  Last A1c of 6.1 on 9/17.    - Sliding scale insulin   - BG rising with tube feeds, started on lantus . Monitor and titrate PRN      Chronic Back pain   No e/o discitis, noted DDD and Moderate to severe canal stenosis at C4-5 and moderate canal stenosis at C3-C4   - Duloxetine    - Pain control PRN. Caution with opioids given gastroparesis. 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 Evaluated by psychiatry (11/7), their assessment was not consistent with self-induced vomiting, eating disorder, or psychotic process   - Duloxetine  and BuSpar .  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   - Continue plavix  and ASA  - Continue statin      Anemia  Mild and intermittent during hospital stay from 9 - 12. In setting of acute illness and ESRD  - Stable   - ESA per nephrology     Fentanyl  positive  Seen on drug screen. May be a false positive in the setting of Benadryl  use. Patient adamantly denies use.        #BMI (Calculated): 24.3    I have personally reviewed the radiographs, laboratory data in Epic and decisions and statements above are based partially on this personal interpretation.                 Care Plan discussed with: Patient, Family, Care Manager, Nursing Staff, and Consultant/Specialist    Discussed:  Care Plan and D/C Planning    Prophylaxis:  Hep SQ    Disposition:  Home w/Family    Total time spent with patient care: 30 Minutes.  **I personally saw and examined the patient during this time period**           ___________________________________________________    Attending Physician: Thena cross, MD        Subjective:     Chief Complaint:      Used  translator service [phone]     Patient instructed on goals to remain NPO due to nausea and vomiting in setting of gastroparesis. She has refused to be NPO and threatened to leave AMA due to NPO. She is willing to stay in hospital if allowed to have food as she is very hungry. I have educated her extensively on the dangers of continued PO intake for her. I have explained that due to  gastroparesis, she is not digesting the food. I have explained that it is consistently resulting in nausea and vomiting. I explained that this causes risk of aspiration of contents into the lungs & the associated harms. Patient still wants to be able to take PO. She feels that the pain of hunger is worse than the pain of N/V & the risks.      For her safety, I believe staying in the hospital is the best thing for her so that we can ensure her tube feeds and abx are set up on DC. As such, I have decided to allow her to take PO at her own risk after counseling was provided.      Patient also explained a lot of anxiety being here in the hospital and for her health. I have ordered IV valium  to assist with the anxiety.      Patient understands that she is being recommended to stay in the hospital so we can ensure she tolerates the TF which were just started consistently this weekend. She understands that we need to arrange TF for her on DC or she would not be able to get any nutrition which could be fatal. She understands that she will need TF teaching and arrangements to be made in order to continue this life saving intervention for her.      She also understands that she is requiring IV abx at this time for MSSA bacteremia until this can be arranged OP for her HD     Patient is willing to stay in the hospital at this time with the knowledge above and ability to take PO if she needs it.      I will reach out to CM, pharmacy, and nephrology to ensure home TF and abx can be arranged with goal of DC in the next 1 - 2 days.      Sitter removed      I have discussed everything above with her nursing staff.     ROS:  12+ ROS reviewed with patient and negative with exception of above           Objective:       Vitals:          Last 24hrs VS reviewed since prior progress note. Most recent are:    Vitals:    08/26/24 0921   BP:    Pulse: 84   Resp:    Temp:    SpO2:      SpO2 Readings from Last 6 Encounters:   08/26/24 98%   07/20/24 94%   07/11/24 98%   07/09/24 98%   07/05/24 95%   07/02/24 95%          Intake/Output Summary (Last 24 hours) at 08/26/2024 1058  Last data filed at 08/26/2024 0209  Gross per 24 hour   Intake --   Output 600 ml   Net -600 ml          Exam:     Physical Exam:     Physical Exam  Constitutional:       General: She is not in acute distress.     Appearance: She is not ill-appearing, toxic-appearing or diaphoretic.      Comments: Disheveled. Malodorous.    HENT:      Head: Normocephalic and atraumatic.      Right Ear: External ear normal.      Left Ear: External ear normal.      Nose: Nose normal.  Mouth/Throat:      Pharynx: Oropharynx is clear.   Eyes:      Extraocular Movements: Extraocular movements intact.      Conjunctiva/sclera: Conjunctivae normal.   Cardiovascular:      Rate and Rhythm: Normal rate.   Pulmonary:      Effort: Pulmonary effort is normal.   Abdominal:      General: Abdomen is flat. There is no distension.      Palpations: Abdomen is soft.      Tenderness: There is abdominal tenderness (generalized).      Comments: No vomiting today during patient interview    Musculoskeletal:         General: No deformity.   Skin:     General: Skin is warm and dry.   Neurological:      General: No focal deficit present.   Psychiatric:         Mood and Affect: Mood normal.      Comments: Flat affect.               Medications Reviewed: (see below)    Lab Data Reviewed: (see below)    ______________________________________________________________________    Medications:     Current Facility-Administered Medications   Medication  Dose Route Frequency    prochlorperazine  (COMPAZINE ) injection 5 mg  5 mg IntraVENous Once    ondansetron  (ZOFRAN -ODT) disintegrating tablet 4 mg  4 mg Oral Q8H PRN    Or    ondansetron  (ZOFRAN ) injection 4 mg  4 mg IntraVENous Q8H PRN    diazePAM  (VALIUM ) injection 2.5 mg  2.5 mg IntraVENous Q6H PRN    traMADol  (ULTRAM ) tablet 50 mg  50 mg PEG Tube Q12H PRN    acetaminophen  (TYLENOL ) tablet 1,000 mg  1,000 mg PEG Tube Q6H    insulin  glargine (LANTUS ) injection vial 10 Units  10 Units SubCUTAneous Nightly    insulin  lispro (HUMALOG ,ADMELOG ) injection vial 0-8 Units  0-8 Units SubCUTAneous 4 times per day    [Held by provider] cloNIDine  (CATAPRES ) 0.2 MG/24HR 1 patch  1 patch TransDERmal Weekly    lansoprazole  (PREVACID  SOLUTAB) disintegrating tablet 30 mg  30 mg Per G Tube BID    metoclopramide  (REGLAN ) 5 MG/5ML solution 5 mg  5 mg Per G Tube TID AC    prochlorperazine  (COMPAZINE ) suppository 25 mg  25 mg Rectal Q12H PRN    sodium chloride  flush 0.9 % injection 5-40 mL  5-40 mL IntraVENous 2 times per day    sodium chloride  flush 0.9 % injection 5-40 mL  5-40 mL IntraVENous PRN    lidocaine  PF 1 % injection 50 mg  50 mg IntraDERmal Once    dextrose  bolus 10% 125 mL  125 mL IntraVENous PRN    Or    dextrose  bolus 10% 250 mL  250 mL IntraVENous PRN    glucagon  injection 1 mg  1 mg SubCUTAneous PRN    dextrose  10 % infusion   IntraVENous Continuous PRN    hydrALAZINE  (APRESOLINE ) tablet 100 mg  100 mg Per G Tube TID    glucose chewable tablet 16 g  4 tablet Per G Tube PRN    labetalol  (NORMODYNE ;TRANDATE ) injection 10 mg  10 mg IntraVENous Q4H PRN    dicyclomine  (BENTYL ) capsule 10 mg  10 mg PEG Tube TID AC    polyethylene glycol (GLYCOLAX ) packet 17 g  17 g Per G Tube Daily PRN    sevelamer  (RENVELA ) packet 0.8 g  0.8 g Per G Tube  TID WC    sodium bicarbonate  tablet 650 mg  650 mg PEG Tube TID    bisacodyl  (DULCOLAX) suppository 10 mg  10 mg Rectal Daily PRN    [Held by provider] epoetin  alfa-epbx (RETACRIT ) injection  10,000 Units  10,000 Units SubCUTAneous Q MWF    aspirin  chewable tablet 81 mg  81 mg Per G Tube Daily    atorvastatin  (LIPITOR ) tablet 80 mg  80 mg Per G Tube QHS    busPIRone  (BUSPAR ) tablet 5 mg  5 mg Per G Tube BID    clopidogrel  (PLAVIX ) tablet 75 mg  75 mg Per G Tube Daily    DULoxetine  (CYMBALTA ) extended release capsule 60 mg  60 mg Per G Tube Daily    famotidine  (PEPCID ) tablet 20 mg  20 mg Per G Tube Daily    sennosides-docusate sodium  (SENOKOT-S) 8.6-50 MG tablet 1 tablet  1 tablet Per G Tube BID    vitamin B-6 (PYRIDOXINE ) tablet 25 mg  25 mg Per G Tube Q6H    ceFAZolin  (ANCEF ) 1,000 mg in sterile water  10 mL IV syringe  1,000 mg IntraVENous Daily    carvedilol  (COREG ) tablet 25 mg  25 mg Per G Tube BID WC    scopolamine  (TRANSDERM-SCOP) transdermal patch 1 patch  1 patch TransDERmal Q72H    albumin  human 25% IV solution 25 g  25 g IntraVENous PRN    0.9 % sodium chloride  infusion   IntraVENous PRN    heparin  (porcine) injection 5,000 Units  5,000 Units SubCUTAneous 3 times per day            Lab Review:     Recent Labs     08/24/24  0758 08/24/24  2009   WBC 6.1 10.5   HGB 11.7 10.5*   HCT 36.7 33.4*   PLT 451* 378     Recent Labs     08/24/24  0502 08/24/24  0758 08/24/24  2009   NA 133*  --  134*   K 3.9  --  3.6   CL 91*  --  94*   CO2 30*  --  29   BUN 31*  --  16   MG  --  2.3  --    PHOS  --  4.3  --    ALT  --   --  6*     No components found for: GLPOC        "

## 2024-08-26 NOTE — Significant Event (Addendum)
"  Used translator service [phone]    Patient instructed on goals to remain NPO due to nausea and vomiting in setting of gastroparesis. She has refused to be NPO and threatened to leave AMA due to NPO. She is willing to stay in hospital if allowed to have food as she is very hungry. I have educated her extensively on the dangers of continued PO intake for her. I have explained that due to gastroparesis, she is not digesting the food. I have explained that it is consistently resulting in nausea and vomiting. I explained that this causes risk of aspiration of contents into the lungs & the associated harms. Patient still wants to be able to take PO. She feels that the pain of hunger is worse than the pain of N/V & the risks.     For her safety, I believe staying in the hospital is the best thing for her so that we can ensure her tube feeds and abx are set up on DC. As such, I have decided to allow her to take PO at her own risk after counseling was provided.     Patient also explained a lot of anxiety being here in the hospital and for her health. I have ordered IV valium  to assist with the anxiety.     Patient understands that she is being recommended to stay in the hospital so we can ensure she tolerates the TF which were just started consistently this weekend. She understands that we need to arrange TF for her on DC or she would not be able to get any nutrition which could be fatal. She understands that she will need TF teaching and arrangements to be made in order to continue this life saving intervention for her.     She also understands that she is requiring IV abx at this time for MSSA bacteremia until this can be arranged OP for her HD    Patient is willing to stay in the hospital at this time with the knowledge above and ability to take PO if she needs it.     I will reach out to CM, pharmacy, and nephrology to ensure home TF and abx can be arranged with goal of DC in the next 1 - 2 days.     Sitter removed    I  have discussed everything above with her nursing staff.     Melinda [name]    I spent from from 0825 - 0905 in the room with the patient today   "

## 2024-08-26 NOTE — Progress Notes (Addendum)
"  9094: pt requesting orange juice despite provided education on remaining npo. Patient agreed to have a popsicle instead.     1214: patient eating a popsicle and stating she is nauseous. Family at bedside asking if she can eat solid foods. Education reinforced on importance of remaining npo.      1324: PCT enters room to find patient eating doritos.     1530: RN enters room to find patient eating chicken wings that family brought her. RN reinforced education again.  "

## 2024-08-26 NOTE — Consults (Signed)
 Session ID: [id number]  Session Duration: 30 minutes  Language: Spanish  Interpreter ID: #[id number]  Interpreter Name: Andrew

## 2024-08-27 LAB — BASIC METABOLIC PANEL
Anion Gap: 11 mmol/L (ref 2–14)
Anion Gap: 10 mmol/L (ref 2–14)
BUN/Creatinine Ratio: 9 — ABNORMAL LOW (ref 12–20)
BUN/Creatinine Ratio: 9 — ABNORMAL LOW (ref 12–20)
BUN: 51 mg/dL — ABNORMAL HIGH (ref 6–20)
BUN: 56 mg/dL — ABNORMAL HIGH (ref 6–20)
CO2: 25 mmol/L (ref 20–29)
CO2: 29 mmol/L (ref 20–29)
Calcium: 7.8 mg/dL — ABNORMAL LOW (ref 8.6–10.0)
Calcium: 8 mg/dL — ABNORMAL LOW (ref 8.6–10.0)
Chloride: 87 mmol/L — ABNORMAL LOW (ref 98–107)
Chloride: 88 mmol/L — ABNORMAL LOW (ref 98–107)
Creatinine: 5.87 mg/dL — ABNORMAL HIGH (ref 0.60–1.00)
Creatinine: 5.98 mg/dL — ABNORMAL HIGH (ref 0.60–1.00)
Est, Glom Filt Rate: 9 ml/min/1.73m2 — ABNORMAL LOW (ref >59)
Est, Glom Filt Rate: 8 ml/min/1.73m2 — ABNORMAL LOW (ref >59)
Glucose: 143 mg/dL — ABNORMAL HIGH (ref 65–100)
Glucose: 113 mg/dL — ABNORMAL HIGH (ref 65–100)
Potassium: 3.9 mmol/L (ref 3.5–5.1)
Potassium: 3.9 mmol/L (ref 3.5–5.1)
Sodium: 122 mmol/L — ABNORMAL LOW (ref 136–145)
Sodium: 126 mmol/L — ABNORMAL LOW (ref 136–145)

## 2024-08-27 LAB — POCT GLUCOSE
POC Glucose: 147 mg/dL — ABNORMAL HIGH (ref 65–117)
POC Glucose: 167 mg/dL — ABNORMAL HIGH (ref 65–117)
POC Glucose: 171 mg/dL — ABNORMAL HIGH (ref 65–117)
POC Glucose: 182 mg/dL — ABNORMAL HIGH (ref 65–117)
POC Glucose: 83 mg/dL (ref 65–117)
POC Glucose: 375 mg/dL — ABNORMAL HIGH (ref 65–117)

## 2024-08-27 LAB — CBC
Hematocrit: 30.8 % — ABNORMAL LOW (ref 35.0–47.0)
Hemoglobin: 9.7 g/dL — ABNORMAL LOW (ref 11.5–16.0)
MCH: 30.6 pg (ref 26.0–34.0)
MCHC: 31.5 g/dL (ref 30.0–36.5)
MCV: 97.2 FL (ref 80.0–99.0)
MPV: 9.7 FL (ref 8.9–12.9)
Nucleated RBCs: 0 /100{WBCs}
Platelets: 346 K/uL (ref 150–400)
RBC: 3.17 M/uL — ABNORMAL LOW (ref 3.80–5.20)
RDW: 15.8 % — ABNORMAL HIGH (ref 11.5–14.5)
WBC: 6 K/uL (ref 3.6–11.0)
nRBC: 0 K/uL (ref 0.00–0.01)

## 2024-08-27 LAB — MAGNESIUM
Magnesium: 2.3 mg/dL (ref 1.6–2.6)
Magnesium: 2.2 mg/dL (ref 1.6–2.6)

## 2024-08-27 LAB — PHOSPHORUS
Phosphorus: 2.3 mg/dL — ABNORMAL LOW (ref 2.5–4.5)
Phosphorus: 2.4 mg/dL — ABNORMAL LOW (ref 2.5–4.5)

## 2024-08-27 MED ORDER — HYDRALAZINE HCL 25 MG PO TABS
25 | Freq: Three times a day (TID) | ORAL | Status: DC
Start: 2024-08-27 — End: 2024-08-28

## 2024-08-27 MED ORDER — DIPHENHYDRAMINE HCL 50 MG/ML IJ SOLN
50 | Freq: Once | INTRAMUSCULAR | Status: AC
Start: 2024-08-27 — End: 2024-08-26
  Administered 2024-08-27: 01:00:00 12.5 mg via INTRAVENOUS

## 2024-08-27 MED FILL — SODIUM BICARBONATE 650 MG PO TABS: 650 mg | ORAL | Qty: 1 | Fill #0

## 2024-08-27 MED FILL — LANSOPRAZOLE 15 MG PO TBDD: 15 mg | ORAL | Qty: 2 | Fill #0

## 2024-08-27 MED FILL — INSULIN LISPRO 100 UNIT/ML IJ SOLN: 100 [IU]/mL | INTRAMUSCULAR | Qty: 8 | Fill #0

## 2024-08-27 MED FILL — VITAMIN B-6 50 MG PO TABS: 50 mg | ORAL | Qty: 1 | Fill #0

## 2024-08-27 MED FILL — DULOXETINE HCL 30 MG PO CPEP: 30 mg | ORAL | Qty: 2 | Fill #0

## 2024-08-27 MED FILL — CEFAZOLIN SODIUM 1 G IJ SOLR: 1 g | INTRAMUSCULAR | Qty: 2000 | Fill #0

## 2024-08-27 MED FILL — INSULIN LISPRO 100 UNIT/ML IJ SOLN: 100 [IU]/mL | INTRAMUSCULAR | Qty: 2 | Fill #0

## 2024-08-27 MED FILL — METOCLOPRAMIDE HCL 10 MG/10ML PO SOLN: 10 mg/mL | ORAL | Qty: 10 | Fill #0

## 2024-08-27 MED FILL — DICYCLOMINE HCL 10 MG PO CAPS: 10 mg | ORAL | Qty: 1 | Fill #0

## 2024-08-27 MED FILL — HEPARIN SODIUM (PORCINE) 5000 UNIT/ML IJ SOLN: 5000 [IU]/mL | INTRAMUSCULAR | Qty: 1 | Fill #0

## 2024-08-27 MED FILL — DIAZEPAM 5 MG/ML IJ SOLN: 5 mg/mL | INTRAMUSCULAR | Qty: 2 | Fill #0

## 2024-08-27 MED FILL — ASPIRIN 81 MG PO CHEW: 81 mg | ORAL | Qty: 1 | Fill #0

## 2024-08-27 MED FILL — CLOPIDOGREL BISULFATE 75 MG PO TABS: 75 mg | ORAL | Qty: 1 | Fill #0

## 2024-08-27 MED FILL — ACETAMINOPHEN EXTRA STRENGTH 500 MG PO TABS: 500 mg | ORAL | Qty: 2 | Fill #0

## 2024-08-27 MED FILL — BUSPIRONE HCL 5 MG PO TABS: 5 mg | ORAL | Qty: 1 | Fill #0

## 2024-08-27 MED FILL — SENNOSIDES-DOCUSATE SODIUM 8.6-50 MG PO TABS: 8.6-50 mg | ORAL | Qty: 1 | Fill #0

## 2024-08-27 MED FILL — SEVELAMER CARBONATE 0.8 G PO PACK: 0.8 g | ORAL | Qty: 1 | Fill #0

## 2024-08-27 MED FILL — DIPHENHYDRAMINE HCL 50 MG/ML IJ SOLN: 50 mg/mL | INTRAMUSCULAR | Qty: 1 | Fill #0

## 2024-08-27 MED FILL — LANTUS 100 UNIT/ML SC SOLN: 100 [IU]/mL | SUBCUTANEOUS | Qty: 10 | Fill #0

## 2024-08-27 MED FILL — FAMOTIDINE 20 MG PO TABS: 20 mg | ORAL | Qty: 1 | Fill #0

## 2024-08-27 MED FILL — ATORVASTATIN CALCIUM 20 MG PO TABS: 20 mg | ORAL | Qty: 4 | Fill #0

## 2024-08-27 MED FILL — HYDRALAZINE HCL 25 MG PO TABS: 25 mg | ORAL | Qty: 4 | Fill #0

## 2024-08-27 NOTE — Flowsheet Note (Signed)
 "  Primary RN SBAR: Zina Kato, RN  Incapacitated Nurse edu. provided: yes  Patient Education provided: yes  Preferred Education method and Primary language: verbal/english  Dialysis consent: yes  Hospital General Consent Verified: yes  Hospital associated wait time; reason: 1 hr/patient not ready, transport  Hepatitis B Surface Ag   Date/Time Value Ref Range Status   08/07/2024 01:38 AM NONREACTIVE NR   Final     Hep B S Ag Interp   Date/Time Value Ref Range Status   03/16/2023 08:19 AM Negative NEG   Final     Hep B S Ab   Date/Time Value Ref Range Status   08/07/2024 01:38 AM <3.50 mIU/mL Final     Comment:        <10.00 mIU/mL     Non-Immune  =>10.00 mIU/mL    Individual is considered to be immune to infection with HBV       Hep B results, dates, and Primary Source: negative/susceptible 08/07/2024, EPIC  Hep B dual verification performed by (RN): Dinep Merilee, RN  Machine disinfect process:HEAT       08/27/24 1045   Observations & Evaluations   Level of Consciousness 0   Oriented X 4   Respiratory Quality/Effort Unlabored   O2 Device None (Room air)   Skin Condition/Temp Dry;Warm   Edema None   Pain Assessment   Pain Assessment 0-10   Pain Level 6   Pain Location Abdomen;Back   Technical Checks   Dialysis Machine No. 03   RO Machine Number [id number]   Dialyzer Lot No. [id number]   Tubing Lot Number [id number]   All Connections Secure Yes   NS Bag Yes   Saline Line Double Clamped Yes   Dialyzer F-180   Prime Volume (mL) 200 mL   ICEBOAT I;C;E;B;O;A;T   RO Machine Log Sheet Completed Yes   Machine Alarm Self Test Completed;Passed   Child Psychotherapist Function;pH Reading   Sport And Exercise Psychologist Conductivity 13.8   Machine Ph 7.4   Bleach Test (Neg) Yes   Bath Temperature 98.6 F (37 C)   Treatment Initiation   Dialyze Hours 3   Treatment  Initiation Universal Precautions maintained;Lines secured to patient;Connections secured;Prime given;Venous Parameters set;Arterial  Parameters set;Air foam detector engaged;Dialysate;Saline line double clamped;Dialyzer   Hemodialysis Fistula/Graft Arteriovenous fistula Left Arm   Placement Date/Time: 08/06/24 1626   Present on Admission/Arrival: Yes  Access Type: Arteriovenous fistula  Orientation: Left  Access Location: Arm   Site Assessment Clean, dry & intact   Thrill Present   Bruit Present   Status Accessed   Venous Needle Size 15 G   Arterial Needle Size 15 G   Accessed ByBETHA CHRISTELLA Nova, RN   Access Attempts  1   Date of Last Dressing Change 08/27/24   Access Interventions Chlorhexidine;Aseptic Technique;Needles taped to patient   Dressing Intervention New   Dialysis Bath   K+ (Potassium) 4   Ca+ (Calcium ) 2.5   Na+ (Sodium) 130   HCO3 (Bicarb) 38        08/27/24 1050   Treatment   Time On 1050   Treatment Goal 0   Vital Signs   BP (!) 163/89   Temp 98.7 F (37.1 C)   Pulse 95   Respirations 16   SpO2 97 %   During Hemodialysis Assessment   Blood Flow Rate (ml/min) 350 ml/min   Arterial Pressure (mmHg) -110 mmHg   Venous Pressure (mmHg) 150  TMP 0   DFR 600   Comments HD tx started   Access Visible Yes   Ultrafiltration Rate (ml/hr) 170 ml/hr   Ultrafiltration Removed (ml) 0 ml          08/27/24 1410   Treatment   Time Off 1355   Observations & Evaluations   Level of Consciousness 0   Oriented X 4   Respiratory Quality/Effort Unlabored   O2 Device None (Room air)   Skin Condition/Temp Dry;Warm   Edema None   Vital Signs   BP (!) 154/71   Temp 98.5 F (36.9 C)   Pulse 88   Respirations 20   Pain Assessment   Pain Assessment 0-10   Pain Level 4   Hemodialysis Fistula/Graft Arteriovenous fistula Left Arm   Placement Date/Time: 08/06/24 1626   Present on Admission/Arrival: Yes  Access Type: Arteriovenous fistula  Orientation: Left  Access Location: Arm   Site Assessment Clean, dry & intact   Thrill Present   Bruit Present   Status Deaccessed   Post-Hemodialysis Assessment   Post-Treatment Procedures Blood returned;Access bleeding time < 10  minutes   Machine Disinfection Process Acid/Vinegar Clean;Heat Disinfect;Exterior Engineer, Agricultural Volume (ml) 300 ml   Blood Volume Processed (Liters) 58.5 L   Dialyzer Clearance Lightly streaked   Duration of Treatment (minutes) 180 minutes   Hemodialysis Intake (ml) 500 ml   Hemodialysis Output (ml) 500 ml   NET Removed (ml) 0   Tolerated Treatment Good   Primary RN SBAR: Zina Kato, RN  Comments: HD tx completed as ordered  "

## 2024-08-27 NOTE — Care Coordination (Addendum)
"  Care Management Progress Note    Reason for Admission:   Hypertensive emergency [I16.1]  Intractable nausea and vomiting [R11.2]         Patient Admission Status: Inpatient  RUR: 44%; LOS 20 days  Hospitalization in the last 30 days (Readmission):  No        Pt transferred from 4th floor to the 5th floor.  EMR reviewed and handoff received from previous case manager Denney).  Reportedly, pt resides with her husband and three daughters (ages 59, 5, 59).    Patient is Spanish speaking    Transition Plan of Care:  Nephrology, Vascular Surgery, ID, GI following for medical management - pt with clogged PEG and await IR for replacement per note on 11/17  Psych RN  - dx of Adjustment d/o with anxiety per note on 11/7  Plan is for pt to return home with family  ESRD - has HD at [facility] on M,W,F  ID orders for ABX at HD (Cefazolin  2 g dosed on Mondays and Wednesdays and 3 g dosed post HD on Fridays thru 09/19/2024.  Orders were faxed to [facility] by my colleague and again by me on 11/17  Outpatient follow up  Family will transport pt home    CM will continue to follow pt until discharged.  D.Bijon Mineer   6.    "

## 2024-08-27 NOTE — Progress Notes (Signed)
 "                  Melinda [name], ACNP-BC  [phone] office  [phone] voicemail     Gastroenterology Progress Note    August 27, 2024  Admit Date: 08/06/2024         Narrative Assessment and Plan   Clogged PEG tube - mainly G port. Unable to successfully unclog G port with endobrush. PEG-J was placed by IR at [facility].     Recommend:  Consult IR for replacement of PEG-J  Use J port only for feeding  Flush G port with water  before and after giving medications        Subjective:   Pt seen on HD. ATSP for clogged PEG-J. Used endobrush to clear G port, minimal resistance, but a lot of medication residue. Unable to fully unclog. Upon instilling small amounts of water  through the G port, the tube balloons out. J port endobrushed without any issues. Flushes OK.     ROS:  The previous review of systems on initial consultation / H&P is noted and reviewed.  Specific changes noted above in HPI.    Current Medications:     Current Facility-Administered Medications   Medication Dose Route Frequency    hydrALAZINE  (APRESOLINE ) tablet 50 mg  50 mg Per G Tube TID    prochlorperazine  (COMPAZINE ) injection 5 mg  5 mg IntraVENous Once    ondansetron  (ZOFRAN -ODT) disintegrating tablet 4 mg  4 mg Oral Q8H PRN    Or    ondansetron  (ZOFRAN ) injection 4 mg  4 mg IntraVENous Q8H PRN    diazePAM  (VALIUM ) injection 2.5 mg  2.5 mg IntraVENous Q6H PRN    ceFAZolin  (ANCEF ) 2,000 mg in sterile water  20 mL IV syringe  2,000 mg IntraVENous Once per day on Monday Wednesday    [START ON 08/31/2024] ceFAZolin  (ANCEF ) 3,000 mg in sodium chloride  0.9 % 100 mL (addEASE)  3,000 mg IntraVENous Weekly    traMADol  (ULTRAM ) tablet 50 mg  50 mg PEG Tube Q12H PRN    acetaminophen  (TYLENOL ) tablet 1,000 mg  1,000 mg PEG Tube Q6H    insulin  glargine (LANTUS ) injection vial 10 Units  10 Units SubCUTAneous Nightly    insulin  lispro (HUMALOG ,ADMELOG ) injection vial 0-8 Units  0-8 Units SubCUTAneous 4 times per day    [Held by provider]  cloNIDine  (CATAPRES ) 0.2 MG/24HR 1 patch  1 patch TransDERmal Weekly    lansoprazole  (PREVACID  SOLUTAB) disintegrating tablet 30 mg  30 mg Per G Tube BID    metoclopramide  (REGLAN ) 5 MG/5ML solution 5 mg  5 mg Per G Tube TID AC    prochlorperazine  (COMPAZINE ) suppository 25 mg  25 mg Rectal Q12H PRN    sodium chloride  flush 0.9 % injection 5-40 mL  5-40 mL IntraVENous 2 times per day    sodium chloride  flush 0.9 % injection 5-40 mL  5-40 mL IntraVENous PRN    lidocaine  PF 1 % injection 50 mg  50 mg IntraDERmal Once    dextrose  bolus 10% 125 mL  125 mL IntraVENous PRN    Or    dextrose  bolus 10% 250 mL  250 mL IntraVENous PRN    glucagon  injection 1 mg  1 mg SubCUTAneous PRN    dextrose  10 % infusion   IntraVENous Continuous PRN    glucose chewable tablet 16 g  4 tablet Per G Tube PRN    labetalol  (NORMODYNE ;TRANDATE ) injection 10 mg  10 mg IntraVENous Q4H  PRN    dicyclomine  (BENTYL ) capsule 10 mg  10 mg PEG Tube TID AC    polyethylene glycol (GLYCOLAX ) packet 17 g  17 g Per G Tube Daily PRN    [Held by provider] sevelamer  (RENVELA ) packet 0.8 g  0.8 g Per G Tube TID WC    sodium bicarbonate  tablet 650 mg  650 mg PEG Tube TID    bisacodyl  (DULCOLAX) suppository 10 mg  10 mg Rectal Daily PRN    [Held by provider] epoetin  alfa-epbx (RETACRIT ) injection 10,000 Units  10,000 Units SubCUTAneous Q MWF    aspirin  chewable tablet 81 mg  81 mg Per G Tube Daily    atorvastatin  (LIPITOR ) tablet 80 mg  80 mg Per G Tube QHS    busPIRone  (BUSPAR ) tablet 5 mg  5 mg Per G Tube BID    clopidogrel  (PLAVIX ) tablet 75 mg  75 mg Per G Tube Daily    DULoxetine  (CYMBALTA ) extended release capsule 60 mg  60 mg Per G Tube Daily    famotidine  (PEPCID ) tablet 20 mg  20 mg Per G Tube Daily    sennosides-docusate sodium  (SENOKOT-S) 8.6-50 MG tablet 1 tablet  1 tablet Per G Tube BID    vitamin B-6 (PYRIDOXINE ) tablet 25 mg  25 mg Per G Tube Q6H    carvedilol  (COREG ) tablet 25 mg  25 mg Per G Tube BID WC    albumin  human 25% IV solution 25 g  25 g  IntraVENous PRN    0.9 % sodium chloride  infusion   IntraVENous PRN    heparin  (porcine) injection 5,000 Units  5,000 Units SubCUTAneous 3 times per day       Objective:     VITALS:   Last 24hrs VS reviewed since prior progress note. Most recent are:  Vitals:    08/27/24 1300   BP: (!) 146/79   Pulse: 91   Resp:    Temp:    SpO2:      Temp (24hrs), Avg:98.2 F (36.8 C), Min:97.5 F (36.4 C), Max:98.7 F (37.1 C)      Intake/Output Summary (Last 24 hours) at 08/27/2024 1314  Last data filed at 08/26/2024 2352  Gross per 24 hour   Intake 902 ml   Output 0 ml   Net 902 ml       EXAM:  General:  Comfortable, no distress    HEENT:  Atraumatic skull, pupils equal  Lungs:   No abnormal audible breath sounds.  Speaking in complete sentences  Heart:   No abnormal audible heart sounds.  Well perfused  Abdomen:  Non-distended, non-tender.  No mass, guarding or rebound. PEG-J in situ - site looks clean and dry  Musc:   No skeletal defects or deformities  Neurologic:   Cranial nerves grossly intact, moves all 4 extremities  Psych:    Tearful  Derm:   No rash, jaundice, nor palpable dermatologic mass    Lab Data Reviewed:   Recent Labs     08/24/24  2009 08/27/24  0250   WBC 10.5 6.0   HGB 10.5* 9.7*   HCT 33.4* 30.8*   PLT 378 346     Recent Labs     08/24/24  2009 08/27/24  0250 08/27/24  0637 08/27/24  0854   NA 134* 122*  --  126*   K 3.6 3.9  --  3.9   CL 94* 87*  --  88*   CO2 29 25  --  29  BUN 16 51*  --  56*   MG  --  2.3 2.2  --    PHOS  --  2.3* 2.4*  --    ALT 6*  --   --   --      No results found for: GLUCPOC  No results for input(s): PH, PCO2, PO2, HCO3, FIO2 in the last 72 hours.  No results for input(s): INR in the last 72 hours.        Assessment:   (See above)  Principal Problem:    Diabetic gastroparesis associated with type 2 diabetes mellitus (HCC)  Active Problems:    Diabetes mellitus type 2 with complications (HCC)    HTN (hypertension), benign    Hyperlipidemia    MSSA bacteremia     End-stage renal disease on hemodialysis (HCC)  Resolved Problems:    ESRD (end stage renal disease) (HCC)    Hypertensive emergency      Plan:   (See above)      Signed By: Melinda Marcus, APRN - NP     08/27/2024  1:14 PM       "

## 2024-08-27 NOTE — Progress Notes (Signed)
"  Spiritual Health Progress Note        Room # 522/01    Name: Melinda [name]           Age: 43 y.o.    Gender: female          MRN: [id number]  Religion: Catholic       Preferred Language: Spanish      Date: 08/27/24  Visit Time: Begin Time: 1527 End Time : 1550  Complexity of Encounter: Moderate      Visit Summary: Chaplain met with patient/ family in response to Spiritual Health rounding. Patient expressed they had the following spiritual resources: personal sense of spirituality/helpful spiritual practices/spiritual community. The patient shared she attends a [employer] faith traditions. Patient also discussed her illness and the limitations associated with it.  The patient finds peace in being able to work. Patient stated they did have a sense of social support. Social support includes spouse/ children/ extended family. Chaplain provided active listening/ discussion of illness/ theological discussion/ coping strategies. Chaplain informed patient that Spiritual Health is available upon further request. Family or visitors were present during visit. Family/visitors included her children (two daughters).     Referral/Consult From: Rounding  Encounter Overview/Reason: Initial Encounter  Encounter Code: Encounter Code: Q9001 Assessment by chaplain services   Crisis (if applicable):    Service Provided For: Patient and family together     Patient was available.    Faith, Belief, Meaning:   Patient identifies as spiritual  is connected with a faith tradition or spiritual practice  has beliefs or practices that help with coping during difficult times  faith/ spirituality is a source of strength  Family/Friends identifies as spiritual  Rituals (if applicable) Type: Careers Information Officer (completed with a Warehouse Manager)    Importance and Influence:  Patient has spiritual/personal beliefs that influence decisions regarding their health  Family/Friends has spiritual/personal beliefs  that influence decisions regarding the patient's health    Community:  Patient   is connected with a spiritual community  indicated that they feel well-supported  Support System Includes   Spouse/Partner  Children  Faith Community  Extended family  Family/Friends   is connected with a spiritual community  indicated that they feel well-supported  Support System Includes   Parent/s  Extended family    Assessment and Plan of Care:   Emotions Expressed by Patient:   Assessment: Despair, Calm, Powerlessness, Coping    Interventions by Chaplain:   Intervention: Active listening, Discussed meaning/purpose, Discussed relationship with God, Discussed belief system/religious practices/faith, Nurtured Hope, Empowerment, Discussed illness injury and its impact, Explored/Affirmed feelings, thoughts, concerns     Result/ Response by Patient:   Outcome: Comfort, Encouraged, Engaged in conversation, Expressed feelings, needs, and concerns, Coping, Expressed Gratitude, Receptive    Patient Plan of Care:   Plan and Referrals  Plan/Referrals: Other (Comment) (Spiritual Health available upon further request.)     Emotions Expressed by Spouse/Family/Friends:   calm  hopeful  gratitude  valued  thankful    Chaplain Interventions with Spouse/ Family/Friends include:   active listening  explored belief system  affirmed coping skills/ support systems  provided ministry of presence    Spouse/Family/Friends Plan of Care:   Spiritual care available upon referral.      Electronically signed by Clayborne BRAVO. White, Chaplain on 08/27/2024 at 3:54 PM  "

## 2024-08-27 NOTE — Progress Notes (Signed)
"  1600: Cefazolin  ordered for admin at 1600. Admin instructions note to administer post HD via fistula. This RN called to pharmacy to verify order. Pharmacy instructed RN to clarify with Dialysis RN and MD. Dialysis unable to be reached. MD verified ok to give cefazolin  via PIV.  "

## 2024-08-27 NOTE — Dialysis (Signed)
"  Hep B verification performed for (RN): Oncoming RN  Hep B results, dates, and Primary Source: Negative/Susceptible  08/07/2024  Epic  Hepatitis B Surface Ag   Date/Time Value Ref Range Status   08/07/2024 01:38 AM NONREACTIVE NR   Final     Hep B S Ag Interp   Date/Time Value Ref Range Status   03/16/2023 08:19 AM Negative NEG   Final     Hep B S Ab   Date/Time Value Ref Range Status   08/07/2024 01:38 AM <3.50 mIU/mL Final     Comment:        <10.00 mIU/mL     Non-Immune  =>10.00 mIU/mL    Individual is considered to be immune to infection with HBV       Machine disinfect process:Acid/Heat  "

## 2024-08-27 NOTE — Plan of Care (Signed)
"    Problem: Chronic Conditions and Co-morbidities  Goal: Patient's chronic conditions and co-morbidity symptoms are monitored and maintained or improved  Outcome: Progressing     Problem: Discharge Planning  Goal: Discharge to home or other facility with appropriate resources  Outcome: Progressing     Problem: Pain  Goal: Verbalizes/displays adequate comfort level or baseline comfort level  Outcome: Progressing     Problem: Safety - Adult  Goal: Free from fall injury  Outcome: Progressing     Problem: Nutrition Deficit:  Goal: Optimize nutritional status  Outcome: Progressing     Problem: Gastrointestinal - Adult  Goal: Minimal or absence of nausea and vomiting  Outcome: Progressing  Goal: Maintains or returns to baseline bowel function  Outcome: Progressing  Goal: Maintains adequate nutritional intake  Outcome: Progressing     Problem: Skin/Tissue Integrity  Goal: Skin integrity remains intact  Description: 1.  Monitor for areas of redness and/or skin breakdown  2.  Assess vascular access sites hourly  3.  Every 4-6 hours minimum:  Change oxygen saturation probe site  4.  Every 4-6 hours:  If on nasal continuous positive airway pressure, respiratory therapy assess nares and determine need for appliance change or resting period  Outcome: Progressing     "

## 2024-08-27 NOTE — Progress Notes (Signed)
 "                             [facility]  Date of Birth: 1980-11-11          Assessment & Plan:     ESRD on HD MWF via [facility], [facility], f/b Dr. Kalvin  Hypertensive urgency-better after HD  DM  AOCD  Chronic abd pain, n/v from gastroparesis-reason for admit  Staph bacteremia cxs now negative     Plan:  Seen on HD today; sodium lowered in bath d/t sodium of 122 this am  ESA MWF  Continue cefazolin  cefazolin  2/2/3 post HD until 09/19/24-spoke with her clinic and they are aware  Discussed with patient and HD RN         Subjective:   CC: ESRD  HPI: Having a lot of anxiety today  Nausea and abd pain are better  Sodium on the lower side this am    Current Facility-Administered Medications   Medication Dose Route Frequency    hydrALAZINE  (APRESOLINE ) tablet 50 mg  50 mg Per G Tube TID    prochlorperazine  (COMPAZINE ) injection 5 mg  5 mg IntraVENous Once    ondansetron  (ZOFRAN -ODT) disintegrating tablet 4 mg  4 mg Oral Q8H PRN    Or    ondansetron  (ZOFRAN ) injection 4 mg  4 mg IntraVENous Q8H PRN    diazePAM  (VALIUM ) injection 2.5 mg  2.5 mg IntraVENous Q6H PRN    ceFAZolin  (ANCEF ) 2,000 mg in sterile water  20 mL IV syringe  2,000 mg IntraVENous Once per day on Monday Wednesday    [START ON 08/31/2024] ceFAZolin  (ANCEF ) 3,000 mg in sodium chloride  0.9 % 100 mL (addEASE)  3,000 mg IntraVENous Weekly    traMADol  (ULTRAM ) tablet 50 mg  50 mg PEG Tube Q12H PRN    acetaminophen  (TYLENOL ) tablet 1,000 mg  1,000 mg PEG Tube Q6H    insulin  glargine (LANTUS ) injection vial 10 Units  10 Units SubCUTAneous Nightly    insulin  lispro (HUMALOG ,ADMELOG ) injection vial 0-8 Units  0-8 Units SubCUTAneous 4 times per day    [Held by provider] cloNIDine  (CATAPRES ) 0.2 MG/24HR 1 patch  1 patch TransDERmal Weekly    lansoprazole  (PREVACID  SOLUTAB) disintegrating tablet 30 mg  30 mg Per G Tube BID    metoclopramide  (REGLAN ) 5 MG/5ML solution 5 mg  5 mg Per G Tube TID AC    prochlorperazine   (COMPAZINE ) suppository 25 mg  25 mg Rectal Q12H PRN    sodium chloride  flush 0.9 % injection 5-40 mL  5-40 mL IntraVENous 2 times per day    sodium chloride  flush 0.9 % injection 5-40 mL  5-40 mL IntraVENous PRN    lidocaine  PF 1 % injection 50 mg  50 mg IntraDERmal Once    dextrose  bolus 10% 125 mL  125 mL IntraVENous PRN    Or    dextrose  bolus 10% 250 mL  250 mL IntraVENous PRN    glucagon  injection 1 mg  1 mg SubCUTAneous PRN    dextrose  10 % infusion   IntraVENous Continuous PRN    glucose chewable tablet 16 g  4 tablet Per G Tube PRN    labetalol  (NORMODYNE ;TRANDATE ) injection 10 mg  10 mg IntraVENous Q4H PRN    dicyclomine  (BENTYL ) capsule 10 mg  10 mg PEG Tube TID AC    polyethylene glycol (GLYCOLAX ) packet 17 g  17  g Per G Tube Daily PRN    [Held by provider] sevelamer  (RENVELA ) packet 0.8 g  0.8 g Per G Tube TID WC    sodium bicarbonate  tablet 650 mg  650 mg PEG Tube TID    bisacodyl  (DULCOLAX) suppository 10 mg  10 mg Rectal Daily PRN    [Held by provider] epoetin  alfa-epbx (RETACRIT ) injection 10,000 Units  10,000 Units SubCUTAneous Q MWF    aspirin  chewable tablet 81 mg  81 mg Per G Tube Daily    atorvastatin  (LIPITOR ) tablet 80 mg  80 mg Per G Tube QHS    busPIRone  (BUSPAR ) tablet 5 mg  5 mg Per G Tube BID    clopidogrel  (PLAVIX ) tablet 75 mg  75 mg Per G Tube Daily    DULoxetine  (CYMBALTA ) extended release capsule 60 mg  60 mg Per G Tube Daily    famotidine  (PEPCID ) tablet 20 mg  20 mg Per G Tube Daily    sennosides-docusate sodium  (SENOKOT-S) 8.6-50 MG tablet 1 tablet  1 tablet Per G Tube BID    vitamin B-6 (PYRIDOXINE ) tablet 25 mg  25 mg Per G Tube Q6H    carvedilol  (COREG ) tablet 25 mg  25 mg Per G Tube BID WC    albumin  human 25% IV solution 25 g  25 g IntraVENous PRN    0.9 % sodium chloride  infusion   IntraVENous PRN    heparin  (porcine) injection 5,000 Units  5,000 Units SubCUTAneous 3 times per day          Objective:     Vitals:  Blood pressure (!) 129/54, pulse 90, temperature 98.7 F  (37.1 C), resp. rate 16, height 1.499 m (4' 11.02), weight 54.6 kg (120 lb 5.9 oz), SpO2 97%.  Temp (24hrs), Avg:98.2 F (36.8 C), Min:97.5 F (36.4 C), Max:98.7 F (37.1 C)      Intake and Output:  No intake/output data recorded.  11/15 1901 - 11/17 0700  In: 902   Out: 600     Physical Exam:               GENERAL ASSESSMENT: uncomfortable, lying in bed, sheets are soiled  HEENT: Nontraumatic   EXTREMITY: no EDEMA; LUE AVG NT, no swelling/warmth, good t/b  NEURO: Grossly non focal          ECG/rhythm:    Data Review        Recent Labs     08/24/24  2009 08/27/24  0250 08/27/24  0854   NA 134* 122* 126*   K 3.6 3.9 3.9   CL 94* 87* 88*   CO2 29 25 29    BUN 16 51* 56*   CREATININE 3.89* 5.87* 5.98*   GLUCOSE 144* 143* 113*   CALCIUM  7.6* 7.8* 8.0*       : Melinda [name] Melinda [name]  08/27/2024        [facility]:  [email or web address]  [email or web address]    [place] office:  [address] [place], [address]  [place], [place] [address]  Phone: [phone]  Fax :     [phone]    [facility] office:  [address]  [place], [place]  [address]  Phone - [phone]  Fax - [phone]                                                                                        "

## 2024-08-27 NOTE — Progress Notes (Addendum)
 "Comprehensive Nutrition Assessment    Type and Reason for Visit: Reassess    Nutrition Recommendations/Plan:     NPO  Enteral feeds per below: (same to be continued at home after discharge)   - Nepro @ 35 mL/hour  - FWF 75 mL q 2 hours        Malnutrition Assessment:  Malnutrition Status:  Insufficient data (08/07/24 1316)    Context:  Chronic Illness     Findings of the 6 clinical characteristics of malnutrition:  Energy Intake:  Mild decrease in energy intake  Weight Loss:  Mild weight loss     Body Fat Loss:  Unable to assess     Muscle Mass Loss:  Unable to assess    Fluid Accumulation:  No fluid accumulation    Grip Strength:  Not Performed     Nutrition Assessment:    43 yo female admitted for HTN emergency, N/V. Pmhx: DM, Gastroparesis, HTN, ESRD - on HD, GJ tube.      11.17: Follow Up. Pt continues to be non-complaint with NPO order, snacking on various foods which is often followed by N/V. She has however been accepting of TF which she has tolerated well over the last couple of days. It has continued to run at goal rate of 35 mL/hr, meeting her estimated nutritional needs. GI unwilling to manage TPN outpatient, therefore Hohn order has been canceled and our plan has pivoted to home TF after discharge which the pt accepts - CM working on setting up OP TF. Labs reviewed, noted hyponatremia Na 126 and mildly low phos. Pt at dialysis this AM. Will continue current TF order.     Addendum: appears TF order was canceled yesterday when NPO order was adjusted. Just re-order TF at above rate.     11.14: Follow Up. Pt accepting of TF yesterday, ran until midnight when NPO was ordered. Nurse reports that pt has continued to be non-compliant with orders. Has continued eating ice cream and drinking soda despite RN educating pt on need for NPO. Emesis has continued. Hohn order has been placed. New plan is for hohn placement + TPN given patients inability to follow instruction and low confidence that pt would adequately  managed home TF. Awaiting IR availability for placement. Unsure if they can accommodate pt on today's schedule, may not have availability until early next week.     TPN at goal rate 63 mL/hour with lipids 3x/week provides 1502 kcal (100% needs); 76 g protein (100% needs).    _________________________________________________    11.13: Follow up. No significant changes. Pt NPO since midnight for KUB today. Will continue to leave TF recs for if/when pt is accepting. Will also continue to leave PN recs as pt has been without adequate nutrition for greater than 1 week. Labs reviewed.     11.12: Follow Up. Pt remains appropriate for PPN however previous Attending did not favor this. Tube feeds have continued to be refused yesterday and this AM. Pt is frequently eating snack items including applesauce, ice cream and apples, however she has frequent episodes of emesis after PO intake. Has not been complying with recommendations from nursing or myself. Refusing some aspects of care this AM. Currently OTF for scheduled MWF HD. Will continue to leave PN recs above. TF order will remain - spoke with nursing about continued encouragement for TF acceptance. Was hoping by now pt would understand TF is not what's leading to emesis. This has been explained to her many times by attending,  GI, and RD. Labs reviewed, noted hyponatremia.     11.10: Follow Up. Last Friday, pt became adamant about refusing TF and only wishing to trial PO diet. This was done over the weekend and pt has continued with N/V 1-2 hours after postprandial - regardless of what's eaten. Continues to refuse TF this AM and is still unable to tolerate PO w/o N/V soon after. Pt appropriate for PN at this point. Labs reviewed. Will reach out to attending regarding PN ordering.     PPN at goal rate 75 mL/hour with lipids 3x/week provides 1038 kcal (76% needs); 77 g protein (100% needs)    11.07: Follow Up. TF ran yesterday at 35 mL/hr goal - stopped overnight after  emesis. This occurred after diet was advanced to regular per pt request and a full meal was eaten. Likely too ambitious after NPO x7 days. Given jejunum placement of TF, I doubt it alone caused emesis especially at low rate of feeds. Was likely combination of full meal + TF ISO of gastroparesis and prior period of NPO. Advocated for continuing TF at goal rate and restricting diet to full liquids. Need to take diet advancement slow to assess tolerance. No family in room bedside when I stopped by - will plan to check after noon. Will continue TF @ 35 mL/hr and monitor tolerance. Labs reviewed, lytes WNL.    11/6: Follow up. TF has not been ran since morning of 11.05. PN order placed but has not been delivered due to inadequate access. GI assessment yesterday concluded small frequent emesis is not composed of or caused by tube feeds. Will re-start TF today. Modifying order from cyclic to continuous and will attempt to meet needs with TF alone. Per nurse, small emesis has continued this AM - clear/mucus. Will trial continuous TF today and reassess tomorrow.     Trickle feeds Nepro @ 35 mL/hour provides 1386 kcal (100% needs),123 g carbs, 62 g protein (90% needs) TF + FWF provides 1384 H2O/day.     11/5:  Follow up.  TF ran over night 11/3 as Nepro at 30 ml/hr x 10 hours which she tolerated with no complaints.  Last night we increased the rate to 40 ml/hr and she had n/v.  NPO now.  GI to see.  Spoke with daughter who states feeding was administered through J-port.  Will need to start PN until pt able to tolerate PO/TF.    PPN rec's:  goal rate of D5, 4.25% AA at 83 ml/hr + 250 ml 20% lipids MWF (TG level of 352).    This will provide 890 kcals (65% kcal needs), 85 gm protein (100% protein needs)    11/3: Follow up.  Pt remains NPO.  Have reached out to MD regarding need to start nutrition support, J-tube feeds vs PPN.  Still having N/V.  No new weight. TF rec's below in bold.      10/30:  Follow up.  Pt remains NPO.   Will recommend starting TF via J port.  Still having N/V.  No new weight.      10/28: MD consult received for TF rec's. Chart review indicate G-J tube placed in August 2025 and started on cyclic TF.  Spoke with pt's daughter at bedside.  She reports pt eats by mouth at home.  Some days she has n/v and is not able to tolerate PO so she gives herself 3 bolus feeds of NovaSource Renal 2.0 3x/day.  Pt and daughter unsure how much she  gives herself, 2 large syringes full of feed.  On days when she eats and tolerates PO then she does not use feeding tube or might do one feed at night.  When I asked what foods she eats by mouth, she told me soups, cooked vegetables, and fruits.  Discussed low fiber diet necessary for gastroparesis, recommended all vegetables be cooked very soft, no beans in her soups, and fruits be canned.  Daughter states she has been losing weight, unable to quantify.  Weight hx in EMR indicates 6% loss over last 5 months which is not significant for time frame.  Weight seems to fluctuate between 115-135 lbs over last 1.5 years.       Pt currently NPO, MD wants to hold off on starting TF.  Pt was actively vomiting during my visit.  Did not complete NFPE because of this.      TF rec's when ready to start TF:  Nepro at 40 ml/hr x 10 hours + 1 ProSource daily + 30 ml h20 flush q4h around the clock.  This will provide 400 ml, 800 kcals (59% kcal needs), 52 gm protein (74% protein needs), 64 gm CHO, 290 + 30 + 180 = 500 ml water           Nutritionally Significant Medications:  NaCl at 100 ml/hr; Epo, Renvela , Vit B6    Estimated Daily Nutrient Needs:  Energy Requirements Based On: Kcal/kg  Weight Used for Energy Requirements: Current  Energy (kcal/day): 1365-1640 (25-30 kcals/kg)  Weight Used for Protein Requirements: Current  Protein (g/day): 70 (1.3 gm/kg)  Method Used for Fluid Requirements: 1 ml/kcal  Fluid (ml/day): 1365    Nutrition Related Findings:   Edema: None   Edema Generalized: None  RUE Edema:  Trace           Recent Labs     08/24/24  2009 08/27/24  0250 08/27/24  0637 08/27/24  0854   GLUCOSE 144* 143*  --  113*   BUN 16 51*  --  56*   CREATININE 3.89* 5.87*  --  5.98*   NA 134* 122*  --  126*   K 3.6 3.9  --  3.9   CL 94* 87*  --  88*   CO2 29 25  --  29   CALCIUM  7.6* 7.8*  --  8.0*   PHOS  --  2.3* 2.4*  --    MG  --  2.3 2.2  --      Recent Labs     08/24/24  1636 08/24/24  1808 08/24/24  1940 08/24/24  2333 08/25/24  0522 08/25/24  1232 08/25/24  1638 08/25/24  1904 08/26/24  0004 08/26/24  0620 08/26/24  1209 08/26/24  1807 08/26/24  2135 08/26/24  2338 08/27/24  0003 08/27/24  0534   POCGLU 482* 346* 146* 185* 148* 218* 185* 129* 285* 195* 221* 298* 147* 171* 167* 182*     Lab Results   Component Value Date/Time    LABA1C 6.1 06/27/2024 05:10 AM    LABA1C 7.0 02/19/2024 05:02 AM    LABA1C 6.8 10/24/2023 03:52 AM    EAG 127 06/27/2024 05:10 AM     Triglycerides   Date Value Ref Range Status   08/14/2024 352 (H) 0 - 150 MG/DL Final     Comment:     Borderline High: 150-199 mg/dL, High: 799-500 mg/dL  Very High: Greater than or equal to 500 mg/dL     89/71/7974 828 (H) 0 -  150 MG/DL Final     Comment:     Borderline High: 150-199 mg/dL, High: 799-500 mg/dL  Very High: Greater than or equal to 500 mg/dL     98/98/7974 844 (H) <150 MG/DL Final     Comment:     Based on NCEP-ATP III:  Triglycerides <150 mg/dL  is considered normal, 150-199 mg/dL  borderline high,  799-500 mg/dL high and  greater than or equal to 500 mg/dL very high.       Last BM: 08/24/24    Wounds: Wound Type: None    Current Nutrition Therapies:  Diet: NPO  Supplements: none  Meal Intake:   Patient Vitals for the past 168 hrs:   PO Meals Eaten (%)   08/26/24 0859 0%     Supplement Intake:  No data found.  Nutrition Support: none    Anthropometric Measures:  Height: 149.9 cm (4' 11.02)  Ideal Body Weight (IBW): 95 lbs (43 kg)       Current Body Weight: 54.6 kg (120 lb 5.9 oz), 126.7 % IBW. Weight Source: Bed scale  Current BMI  (kg/m2): 24.3        Weight Adjustment For: No Adjustment                 BMI Categories: Normal Weight (BMI 18.5-24.9)    Wt Readings from Last 10 Encounters:   08/06/24 54.6 kg (120 lb 5.9 oz)   07/20/24 57.2 kg (126 lb 1.7 oz)   07/11/24 54.6 kg (120 lb 5.9 oz)   07/09/24 55.5 kg (122 lb 5.7 oz)   03/06/24 57.6 kg (127 lb)   02/21/24 58 kg (127 lb 13.9 oz)   11/28/23 52.2 kg (115 lb 1.3 oz)   10/31/23 52.6 kg (115 lb 15.4 oz)   10/21/23 55.2 kg (121 lb 11.1 oz)   10/12/23 60.4 kg (133 lb 3.2 oz)       Nutrition Diagnosis:   Inadequate protein-energy intake related to nausea/vomiting/diarrhea as evidenced by poor intake prior to admission    Nutrition Interventions:   Food and/or Nutrient Delivery: Continue Current Tube Feeding  Nutrition Education/Counseling: Education/Counseling initiated  Coordination of Nutrition Care: Continue to monitor while inpatient       Goals:  Previous Goal Met: Goal(s) Achieved  Goals: Tolerate nutrition support at goal rate, by next RD assessment       Nutrition Monitoring and Evaluation:   Behavioral-Environmental Outcomes: None Identified  Food/Nutrient Intake Outcomes: Diet Advancement/Tolerance, Food and Nutrient Intake, Enteral Nutrition Intake/Tolerance  Physical Signs/Symptoms Outcomes: Biochemical Data, Nausea or Vomiting, Weight    Discharge Planning:    Enteral Nutrition     Victory Ranger, MS, RD  Available via [employer]    "

## 2024-08-27 NOTE — Progress Notes (Signed)
 "[facility]  [address] [place], [place] [address]  [phone]        Hospitalist Progress Note      NAME: Melinda [name]   DOB:  January 26, 1981  MRM:  [id number]    Date/Time of service: 08/27/2024  2:37 PM       Subjective:     Patient was personally seen and examined by me during this time period.  Chart reviewed.    Follow up gastroparesis with intractable vomiting.  Hypertensive urgency.  Has a PEG-J tube but noncompliant with NPO.  Tube feeds initiated.  MSSA bacteremia.  Now getting antibiotics through hemodialysis.    Patient reports: Feeling well with no complaints.  Had her hemodialysis.  Was due to be discharged today but the PEG tube is clogged.  GI was unable to unclog.  IR consulted for replacement or unclogging.       Objective:       Vitals:       Last 24hrs VS reviewed since prior progress note. Most recent are:    Vitals:    08/27/24 1425   BP: (!) 177/85   Pulse: 90   Resp: 18   Temp: 98.4 F (36.9 C)   SpO2: 98%     SpO2 Readings from Last 6 Encounters:   08/27/24 98%   07/20/24 94%   07/11/24 98%   07/09/24 98%   07/05/24 95%   07/02/24 95%          Intake/Output Summary (Last 24 hours) at 08/27/2024 1437  Last data filed at 08/27/2024 1410  Gross per 24 hour   Intake 1402 ml   Output 500 ml   Net 902 ml        Exam:     Physical Exam:    Gen:  Well-developed, well-nourished, in no acute distress  HEENT:  Pink conjunctivae, EOMI, hearing intact to voice, moist mucous membranes  Resp:  No accessory muscle use, clear breath sounds without wheezes rales or rhonchi  Card:  No murmurs, normal S1, S2 without thrills, bruits or peripheral edema  Abd:  Soft, generalized mild tenderness to palpation. PEG in place  Musc:  No cyanosis or clubbing  Skin:  No rashes or ulcers, skin turgor is good  Neuro:  follows commands appropriately  Psych:  Good insight, oriented to person, place and time, alert      Medications Reviewed: (see below)    Lab Data Reviewed: (see  below)    ______________________________________________________________________    Medications:     Current Facility-Administered Medications   Medication Dose Route Frequency    hydrALAZINE  (APRESOLINE ) tablet 50 mg  50 mg Per G Tube TID    prochlorperazine  (COMPAZINE ) injection 5 mg  5 mg IntraVENous Once    ondansetron  (ZOFRAN -ODT) disintegrating tablet 4 mg  4 mg Oral Q8H PRN    Or    ondansetron  (ZOFRAN ) injection 4 mg  4 mg IntraVENous Q8H PRN    diazePAM  (VALIUM ) injection 2.5 mg  2.5 mg IntraVENous Q6H PRN    ceFAZolin  (ANCEF ) 2,000 mg in sterile water  20 mL IV syringe  2,000 mg IntraVENous Once per day on Monday Wednesday    [START ON 08/31/2024] ceFAZolin  (ANCEF ) 3,000 mg in sodium chloride  0.9 % 100 mL (addEASE)  3,000 mg IntraVENous Weekly    traMADol  (ULTRAM ) tablet 50 mg  50 mg PEG Tube Q12H PRN    acetaminophen  (TYLENOL ) tablet 1,000 mg  1,000 mg PEG  Tube Q6H    insulin  glargine (LANTUS ) injection vial 10 Units  10 Units SubCUTAneous Nightly    insulin  lispro (HUMALOG ,ADMELOG ) injection vial 0-8 Units  0-8 Units SubCUTAneous 4 times per day    [Held by provider] cloNIDine  (CATAPRES ) 0.2 MG/24HR 1 patch  1 patch TransDERmal Weekly    lansoprazole  (PREVACID  SOLUTAB) disintegrating tablet 30 mg  30 mg Per G Tube BID    metoclopramide  (REGLAN ) 5 MG/5ML solution 5 mg  5 mg Per G Tube TID AC    prochlorperazine  (COMPAZINE ) suppository 25 mg  25 mg Rectal Q12H PRN    sodium chloride  flush 0.9 % injection 5-40 mL  5-40 mL IntraVENous 2 times per day    sodium chloride  flush 0.9 % injection 5-40 mL  5-40 mL IntraVENous PRN    lidocaine  PF 1 % injection 50 mg  50 mg IntraDERmal Once    dextrose  bolus 10% 125 mL  125 mL IntraVENous PRN    Or    dextrose  bolus 10% 250 mL  250 mL IntraVENous PRN    glucagon  injection 1 mg  1 mg SubCUTAneous PRN    dextrose  10 % infusion   IntraVENous Continuous PRN    glucose chewable tablet 16 g  4 tablet Per G Tube PRN    labetalol  (NORMODYNE ;TRANDATE ) injection 10 mg  10 mg  IntraVENous Q4H PRN    dicyclomine  (BENTYL ) capsule 10 mg  10 mg PEG Tube TID AC    polyethylene glycol (GLYCOLAX ) packet 17 g  17 g Per G Tube Daily PRN    [Held by provider] sevelamer  (RENVELA ) packet 0.8 g  0.8 g Per G Tube TID WC    sodium bicarbonate  tablet 650 mg  650 mg PEG Tube TID    bisacodyl  (DULCOLAX) suppository 10 mg  10 mg Rectal Daily PRN    [Held by provider] epoetin  alfa-epbx (RETACRIT ) injection 10,000 Units  10,000 Units SubCUTAneous Q MWF    aspirin  chewable tablet 81 mg  81 mg Per G Tube Daily    atorvastatin  (LIPITOR ) tablet 80 mg  80 mg Per G Tube QHS    busPIRone  (BUSPAR ) tablet 5 mg  5 mg Per G Tube BID    clopidogrel  (PLAVIX ) tablet 75 mg  75 mg Per G Tube Daily    DULoxetine  (CYMBALTA ) extended release capsule 60 mg  60 mg Per G Tube Daily    famotidine  (PEPCID ) tablet 20 mg  20 mg Per G Tube Daily    sennosides-docusate sodium  (SENOKOT-S) 8.6-50 MG tablet 1 tablet  1 tablet Per G Tube BID    vitamin B-6 (PYRIDOXINE ) tablet 25 mg  25 mg Per G Tube Q6H    carvedilol  (COREG ) tablet 25 mg  25 mg Per G Tube BID WC    albumin  human 25% IV solution 25 g  25 g IntraVENous PRN    0.9 % sodium chloride  infusion   IntraVENous PRN    heparin  (porcine) injection 5,000 Units  5,000 Units SubCUTAneous 3 times per day          Lab Review:     Recent Labs     08/24/24  2009 08/27/24  0250   WBC 10.5 6.0   HGB 10.5* 9.7*   HCT 33.4* 30.8*   PLT 378 346     Recent Labs     08/24/24  2009 08/27/24  0250 08/27/24  0637 08/27/24  0854   NA 134* 122*  --  126*   K 3.6 3.9  --  3.9   CL 94* 87*  --  88*   CO2 29 25  --  29   BUN 16 51*  --  56*   MG  --  2.3 2.2  --    PHOS  --  2.3* 2.4*  --    ALT 6*  --   --   --      No results found for: GLUCPOC       Assessment / Plan:     43 year old female with ESRD, type 2 diabetes, hypertension, admitted 08/06/24 with acute on chronic vomiting and abdominal pain likely secondary to gastroparesis.     Addendum: Discussed with Dr. Jethava and Cavalier  Rudd (pharmacy).  Pharm/nephro arranging for her to get cefazolin  with HD starting tomorrow per Dr. Levern (ID) recommended regimen so that she doesn't require IV access in case it is lost again. Cancelled Hohn.      Gastroparesis with emesis abdominal pain s/p GJ tube  POA, acute on chronic. Reason for admission. Patient has chronic intractable N/V which lead to GJ placement, but sxs have continued. Normal abdominal imaging. GJ with correct placement. Evaluated by GI, sxs will be difficult to treat & PO intake will lead to continued N/V. It has been explained to patient that GJ is past ligament of treitz, GJ feeds are not the cause of her sxs, and feeds will provide nutrition even in setting of emesis. Recommend continued tube feeds; however patient has been noncompliant with plan.    - KUB (11/12) with proper GJ positioning. Continues to be noncompliant with NPO & ongoing N/V; however willing to stay on tube feeds at this point  - GI re-evaluated. Recommend continue current plan with TF & bowel regimen. They do not recommend parenteral nutrition & cannot manage this in the OP setting.    - Diet: Strict NPO. Maintain nutrition through tube feeds as possible, she has been doing well with this through the weekend. Dietician following.   - PPI, Pepcid , scopolamine  patch   - Reglan  scheduled   - Zofran  PRN, caution given Qtc   - Bowel regimen, goal 1 BM per day. Suppository PRN if no BM  - Reduce opioids, switch to tramadol  PRN BID     Hypertension   Labile BP  Vasovagal Syncope   POA. Chronic HTN with elevated Bps during earlier hospital course. BP labile, and hypotensive yesterday following HD. Ultimately resulting in suspected vasovagal syncopal episode 11/14. CT head negative. BP improved with albumin   - BP improved. Dizziness resolved.   - Continue home Coreg  and hydralazine  with parameters  - Nifedipine  discontinued (cannot be crushed). Switched to clonidine  patch. Will discontinue this for now given hypotension/syncope      Sepsis  secondary to MSSA bacteremia  Evaluated by ID. Low suspicion however cannot rule out AV graft infected hematoma.  Vascular surgery followed care, no intervention planned   - Per ID, cefazolin  2 g dosed post HD Mondays and Wednesdays and 3 g dosed post HD on Fridays. Currently patient is on cefazolin  1g QD through IV. She is a very hard stick with inconsistent IV access & limited access options due to ESRD. I am discussing with pharm/nephrology to arrange for her to get her abx during HD sessions per Dr. Levern order so that IV access is not needed, Burien  Rudd & Dr. Jethava assisting. Until this can be definitely arranged, continue pIV (as placed per anesthesia) in right upper extremity. This not the most ideal access for vein preservation  Alpha Streptococcus isolated from urine   Evaluated by ID. Likely colonization   - Abx as above     Prolonged QT  Qtc 503. 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 Phosphorus now normal  - Continue sevelamer      ESRD  On HD  - Nephrology following     GERD  Chronic.   - Continue Pepcid  and PPI     T2DM with gastroparesis.   POA, chronic.  Last A1c of 6.1 on 9/17.    - Sliding scale insulin   - BG rising with tube feeds, started on lantus . Monitor and titrate PRN      Chronic Back pain   No e/o discitis, noted DDD and Moderate to severe canal stenosis at C4-5 and moderate canal stenosis at C3-C4   - Duloxetine    - Pain control PRN. Caution with opioids given gastroparesis. Switched to tramadol  & reduced frequency dosing      Anxiety/depression  Adjustment disorder in setting of medical issues/prolonged stay  There was concern about her flat affect, not caring for self (urinating on self, refusing to be cleaned), noncompliance with medical plan. Evaluated by psychiatry (11/7), their assessment was not consistent with self-induced vomiting, eating disorder, or psychotic process   -  Duloxetine  and BuSpar .  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   - Continue plavix  and ASA  - Continue statin      Anemia  Mild and intermittent during hospital stay from 9 - 12. In setting of acute illness and ESRD  - Stable   - ESA per nephrology      Fentanyl  positive  Seen on drug screen. May be a false positive in the setting of Benadryl  use. Patient adamantly denies use.       In caring for this patient today, I reviewed recent notes, interpreted lab results and imaging tests, had a face-to-face encounter with the patient, performing the medically necessary appropriate exam and history, counseled the patient/family/caregiver, ordered medications, tests and procedures as indicated for diagnostic and therapeutic purposes, consulted and communicated with other healthcare professionals including care coordination and discharge planning, and documented clinical information in the electronic health record.                 Care Plan discussed with: Patient, Care Manager, and Nursing Staff    Discussed:  Care Plan and D/C Planning    Prophylaxis:  Hep SQ    Disposition:  Home w/Family           ___________________________________________________    Attending Physician: Othella Crock, MD       "

## 2024-08-27 NOTE — Consults (Signed)
"  Clearing consult, see note from Lauraine Marcus, RGA below.  "

## 2024-08-28 ENCOUNTER — Inpatient Hospital Stay: Admit: 2024-08-28 | Payer: PRIVATE HEALTH INSURANCE

## 2024-08-28 LAB — EKG 12-LEAD
Atrial Rate: 114 {beats}/min
Atrial Rate: 91 {beats}/min
Diagnosis: NORMAL
P Axis: 54 degrees
P Axis: 64 degrees
P-R Interval: 132 ms
P-R Interval: 144 ms
Q-T Interval: 348 ms
Q-T Interval: 392 ms
QRS Duration: 84 ms
QRS Duration: 94 ms
QTc Calculation (Bazett): 479 ms
QTc Calculation (Bazett): 482 ms
R Axis: -81 degrees
R Axis: -68 degrees
T Axis: 85 degrees
T Axis: 96 degrees
Ventricular Rate: 114 {beats}/min
Ventricular Rate: 91 {beats}/min

## 2024-08-28 LAB — POCT GLUCOSE
POC Glucose: 235 mg/dL — ABNORMAL HIGH (ref 65–117)
POC Glucose: 200 mg/dL — ABNORMAL HIGH (ref 65–117)
POC Glucose: 156 mg/dL — ABNORMAL HIGH (ref 65–117)
POC Glucose: 183 mg/dL — ABNORMAL HIGH (ref 65–117)
POC Glucose: 171 mg/dL — ABNORMAL HIGH (ref 65–117)

## 2024-08-28 MED ORDER — FENTANYL CITRATE (PF) 100 MCG/2ML IJ SOLN
100 | INTRAMUSCULAR | Status: AC
Start: 2024-08-28 — End: 2024-08-28

## 2024-08-28 MED ORDER — IOPAMIDOL 61 % IV SOLN
61 | INTRAVENOUS | Status: AC
Start: 2024-08-28 — End: 2024-08-28

## 2024-08-28 MED ORDER — SODIUM BICARBONATE 650 MG PO TABS
650 | ORAL_TABLET | Freq: Three times a day (TID) | ORAL | 0 refills | 30.00000 days | Status: AC
Start: 2024-08-28 — End: ?

## 2024-08-28 MED ORDER — PROCHLORPERAZINE EDISYLATE 10 MG/2ML IJ SOLN
10 | Freq: Four times a day (QID) | INTRAMUSCULAR | Status: DC | PRN
Start: 2024-08-28 — End: 2024-08-28
  Administered 2024-08-28: 22:00:00 10 mg via INTRAVENOUS

## 2024-08-28 MED ORDER — LACTATED RINGERS IV BOLUS
Freq: Once | INTRAVENOUS | Status: DC
Start: 2024-08-28 — End: 2024-08-28
  Administered 2024-08-28: 15:00:00 1000 mL via INTRAVENOUS

## 2024-08-28 MED ORDER — ONDANSETRON HCL 4 MG/2ML IJ SOLN
4 | INTRAMUSCULAR | Status: AC | PRN
Start: 2024-08-28 — End: 2024-08-28
  Administered 2024-08-28: 21:00:00 4 via INTRAVENOUS

## 2024-08-28 MED ORDER — DULOXETINE HCL 60 MG PO CPEP
60 | ORAL_CAPSULE | Freq: Every day | ORAL | 3 refills | 90.00000 days | Status: AC
Start: 2024-08-28 — End: ?

## 2024-08-28 MED ORDER — HYDRALAZINE HCL 50 MG PO TABS
50 | ORAL_TABLET | Freq: Three times a day (TID) | ORAL | 3 refills | 30.00000 days | Status: AC
Start: 2024-08-28 — End: ?

## 2024-08-28 MED ORDER — ATORVASTATIN CALCIUM 80 MG PO TABS
80 | ORAL_TABLET | Freq: Every evening | ORAL | 3 refills | 90.00000 days | Status: AC
Start: 2024-08-28 — End: ?

## 2024-08-28 MED ORDER — CLOPIDOGREL BISULFATE 75 MG PO TABS
75 | ORAL_TABLET | Freq: Every day | ORAL | 3 refills | 30.00000 days | Status: AC
Start: 2024-08-28 — End: ?

## 2024-08-28 MED ORDER — CARVEDILOL 25 MG PO TABS
25 | ORAL_TABLET | Freq: Two times a day (BID) | ORAL | 3 refills | 30.00000 days | Status: AC
Start: 2024-08-28 — End: ?

## 2024-08-28 MED ORDER — CEFAZOLIN SODIUM 500 MG IJ SOLR
500 | INTRAMUSCULAR | 0 refills | Status: AC
Start: 2024-08-28 — End: ?

## 2024-08-28 MED ORDER — LIDOCAINE HCL (PF) 1 % IJ SOLN
1 | INTRAMUSCULAR | Status: AC | PRN
Start: 2024-08-28 — End: 2024-08-28
  Administered 2024-08-28: 21:00:00 5 via INTRADERMAL

## 2024-08-28 MED ORDER — LANSOPRAZOLE 30 MG PO TBDD
30 | ORAL_TABLET | Freq: Two times a day (BID) | ORAL | 3 refills | 90.00000 days | Status: AC
Start: 2024-08-28 — End: ?

## 2024-08-28 MED ORDER — SENNA-DOCUSATE SODIUM 8.6-50 MG PO TABS
8.6-50 | ORAL_TABLET | Freq: Two times a day (BID) | ORAL | 0 refills | 30.00000 days | Status: DC
Start: 2024-08-28 — End: 2024-09-12

## 2024-08-28 MED ORDER — BUSPIRONE HCL 5 MG PO TABS
5 | ORAL_TABLET | Freq: Two times a day (BID) | ORAL | 0 refills | 30.00000 days | Status: AC
Start: 2024-08-28 — End: ?

## 2024-08-28 MED ORDER — FENTANYL CITRATE (PF) 100 MCG/2ML IJ SOLN
100 | INTRAMUSCULAR | Status: AC | PRN
Start: 2024-08-28 — End: 2024-08-28
  Administered 2024-08-28 (×2): 50 via INTRAVENOUS

## 2024-08-28 MED ORDER — ASPIRIN 81 MG PO CHEW
81 | ORAL_TABLET | Freq: Every day | ORAL | 3 refills | 30.00000 days | Status: AC
Start: 2024-08-28 — End: ?

## 2024-08-28 MED ORDER — LIDOCAINE HCL 1% INJ (MIXTURES ONLY)
1 | INTRAMUSCULAR | Status: AC
Start: 2024-08-28 — End: 2024-08-28

## 2024-08-28 MED ORDER — ONDANSETRON HCL 4 MG/2ML IJ SOLN
4 | INTRAMUSCULAR | Status: AC
Start: 2024-08-28 — End: 2024-08-28

## 2024-08-28 MED ORDER — IOPAMIDOL 61 % IV SOLN
61 | INTRAVENOUS | Status: AC | PRN
Start: 2024-08-28 — End: 2024-08-28
  Administered 2024-08-28: 21:00:00 25

## 2024-08-28 MED ORDER — INSULIN GLARGINE 100 UNIT/ML SC SOLN
100 | Freq: Every evening | SUBCUTANEOUS | 3 refills | 60.00000 days | Status: AC
Start: 2024-08-28 — End: ?

## 2024-08-28 MED ORDER — DICYCLOMINE HCL 10 MG PO CAPS
10 | ORAL_CAPSULE | Freq: Three times a day (TID) | ORAL | 3 refills | 90.00000 days | Status: AC
Start: 2024-08-28 — End: ?

## 2024-08-28 MED ORDER — METOCLOPRAMIDE HCL 5 MG/5ML PO SOLN
5 | Freq: Three times a day (TID) | ORAL | 3 refills | 35.00000 days | Status: AC
Start: 2024-08-28 — End: ?

## 2024-08-28 MED ORDER — FAMOTIDINE 20 MG PO TABS
20 | ORAL_TABLET | Freq: Every day | ORAL | 3 refills | 30.00000 days | Status: AC
Start: 2024-08-28 — End: ?

## 2024-08-28 MED FILL — LABETALOL HCL 5 MG/ML IV SOLN: 5 mg/mL | INTRAVENOUS | Qty: 4 | Fill #0

## 2024-08-28 MED FILL — LACTATED RINGERS IV SOLN: INTRAVENOUS | Qty: 1000 | Fill #0

## 2024-08-28 MED FILL — INSULIN LISPRO 100 UNIT/ML IJ SOLN: 100 [IU]/mL | INTRAMUSCULAR | Qty: 2 | Fill #0

## 2024-08-28 MED FILL — DIAZEPAM 5 MG/ML IJ SOLN: 5 mg/mL | INTRAMUSCULAR | Qty: 2 | Fill #0

## 2024-08-28 MED FILL — PROCHLORPERAZINE EDISYLATE 10 MG/2ML IJ SOLN: 10 MG/2ML | INTRAMUSCULAR | Qty: 2 | Fill #0

## 2024-08-28 MED FILL — ISOVUE-300 61 % IV SOLN: 61 % | INTRAVENOUS | Qty: 100 | Fill #0

## 2024-08-28 MED FILL — LANTUS 100 UNIT/ML SC SOLN: 100 [IU]/mL | SUBCUTANEOUS | Qty: 10 | Fill #0

## 2024-08-28 MED FILL — XYLOCAINE-MPF 1 % IJ SOLN: 1 % | INTRAMUSCULAR | Qty: 30 | Fill #0

## 2024-08-28 MED FILL — ONDANSETRON HCL 4 MG/2ML IJ SOLN: 4 MG/2ML | INTRAMUSCULAR | Qty: 2 | Fill #0

## 2024-08-28 MED FILL — FENTANYL CITRATE (PF) 100 MCG/2ML IJ SOLN: 100 MCG/2ML | INTRAMUSCULAR | Qty: 2 | Fill #0

## 2024-08-28 MED FILL — CEFAZOLIN SODIUM 1 G IJ SOLR: 1 g | INTRAMUSCULAR | Qty: 2000 | Fill #0

## 2024-08-28 NOTE — Consults (Signed)
 Session ID: [id number]  Session Duration: 10 minutes  Language: Spanish  Interpreter ID: #[id number]  Interpreter Name: Shasta

## 2024-08-28 NOTE — Care Coordination (Signed)
"  08/28/2024  1:34 PM  CM spoke w/ [facility], per office pt is transferred to [facility] for ongoing nephrology follow up.  Georgann Neysa HECKLE  Case Manager    "

## 2024-08-28 NOTE — Progress Notes (Signed)
"  Patient back from surgery. Patient eating taco salad, sour cream, guacamole, and diced chicken.   "

## 2024-08-28 NOTE — Progress Notes (Signed)
 "                            [place] [facility]  Date of Birth: 23-Feb-1981          Assessment & Plan:     ESRD on HD MWF via [facility], [facility], f/b Dr. Kalvin  Hypertensive urgency-better after HD  DM  AOCD  Chronic abd pain, n/v from gastroparesis-reason for admit  Staph bacteremia cxs now negative     Plan:  Next HD tomorrow and MWF  ESA MWF  Continue cefazolin  cefazolin  2/2/3 post HD until 09/19/24-spoke with her clinic and they are aware  Discussed with patient and HD RN         Subjective:   CC: ESRD  HPI: Sleeping peacefully today    Current Facility-Administered Medications   Medication Dose Route Frequency    hydrALAZINE  (APRESOLINE ) tablet 50 mg  50 mg Per G Tube TID    prochlorperazine  (COMPAZINE ) injection 5 mg  5 mg IntraVENous Once    ondansetron  (ZOFRAN -ODT) disintegrating tablet 4 mg  4 mg Oral Q8H PRN    Or    ondansetron  (ZOFRAN ) injection 4 mg  4 mg IntraVENous Q8H PRN    diazePAM  (VALIUM ) injection 2.5 mg  2.5 mg IntraVENous Q6H PRN    ceFAZolin  (ANCEF ) 2,000 mg in sterile water  20 mL IV syringe  2,000 mg IntraVENous Once per day on Monday Wednesday    [START ON 08/31/2024] ceFAZolin  (ANCEF ) 3,000 mg in sodium chloride  0.9 % 100 mL (addEASE)  3,000 mg IntraVENous Weekly    traMADol  (ULTRAM ) tablet 50 mg  50 mg PEG Tube Q12H PRN    acetaminophen  (TYLENOL ) tablet 1,000 mg  1,000 mg PEG Tube Q6H    insulin  glargine (LANTUS ) injection vial 10 Units  10 Units SubCUTAneous Nightly    insulin  lispro (HUMALOG ,ADMELOG ) injection vial 0-8 Units  0-8 Units SubCUTAneous 4 times per day    [Held by provider] cloNIDine  (CATAPRES ) 0.2 MG/24HR 1 patch  1 patch TransDERmal Weekly    lansoprazole  (PREVACID  SOLUTAB) disintegrating tablet 30 mg  30 mg Per G Tube BID    metoclopramide  (REGLAN ) 5 MG/5ML solution 5 mg  5 mg Per G Tube TID AC    prochlorperazine  (COMPAZINE ) suppository 25 mg  25 mg Rectal Q12H PRN    sodium chloride  flush 0.9 % injection 5-40 mL  5-40 mL  IntraVENous 2 times per day    sodium chloride  flush 0.9 % injection 5-40 mL  5-40 mL IntraVENous PRN    lidocaine  PF 1 % injection 50 mg  50 mg IntraDERmal Once    dextrose  bolus 10% 125 mL  125 mL IntraVENous PRN    Or    dextrose  bolus 10% 250 mL  250 mL IntraVENous PRN    glucagon  injection 1 mg  1 mg SubCUTAneous PRN    dextrose  10 % infusion   IntraVENous Continuous PRN    glucose chewable tablet 16 g  4 tablet Per G Tube PRN    labetalol  (NORMODYNE ;TRANDATE ) injection 10 mg  10 mg IntraVENous Q4H PRN    dicyclomine  (BENTYL ) capsule 10 mg  10 mg PEG Tube TID AC    polyethylene glycol (GLYCOLAX ) packet 17 g  17 g Per G Tube Daily PRN    [Held by provider] sevelamer  (RENVELA ) packet 0.8 g  0.8 g Per G Tube TID WC  sodium bicarbonate  tablet 650 mg  650 mg PEG Tube TID    bisacodyl  (DULCOLAX) suppository 10 mg  10 mg Rectal Daily PRN    [Held by provider] epoetin  alfa-epbx (RETACRIT ) injection 10,000 Units  10,000 Units SubCUTAneous Q MWF    aspirin  chewable tablet 81 mg  81 mg Per G Tube Daily    atorvastatin  (LIPITOR ) tablet 80 mg  80 mg Per G Tube QHS    busPIRone  (BUSPAR ) tablet 5 mg  5 mg Per G Tube BID    clopidogrel  (PLAVIX ) tablet 75 mg  75 mg Per G Tube Daily    DULoxetine  (CYMBALTA ) extended release capsule 60 mg  60 mg Per G Tube Daily    famotidine  (PEPCID ) tablet 20 mg  20 mg Per G Tube Daily    sennosides-docusate sodium  (SENOKOT-S) 8.6-50 MG tablet 1 tablet  1 tablet Per G Tube BID    vitamin B-6 (PYRIDOXINE ) tablet 25 mg  25 mg Per G Tube Q6H    carvedilol  (COREG ) tablet 25 mg  25 mg Per G Tube BID WC    albumin  human 25% IV solution 25 g  25 g IntraVENous PRN    0.9 % sodium chloride  infusion   IntraVENous PRN    [Held by provider] heparin  (porcine) injection 5,000 Units  5,000 Units SubCUTAneous 3 times per day          Objective:     Vitals:  Blood pressure (!) 168/72, pulse (!) 102, temperature 99.3 F (37.4 C), temperature source Oral, resp. rate 16, height 1.499 m (4' 11.02), weight 54.6  kg (120 lb 5.9 oz), SpO2 99%.  Temp (24hrs), Avg:98.7 F (37.1 C), Min:98.4 F (36.9 C), Max:99.3 F (37.4 C)      Intake and Output:  11/18 0701 - 11/18 1900  In: 20 [I.V.:20]  Out: -   11/16 1901 - 11/18 0700  In: 890   Out: 500     Physical Exam:               GENERAL ASSESSMENT: sleeping peacefully  HEENT: Nontraumatic   EXTREMITY: no EDEMA; LUE AVG NT, no swelling/warmth, good t/b  NEURO: Grossly non focal          ECG/rhythm:    Data Review        Recent Labs     08/27/24  0250 08/27/24  0854   NA 122* 126*   K 3.9 3.9   CL 87* 88*   CO2 25 29   BUN 51* 56*   CREATININE 5.87* 5.98*   GLUCOSE 143* 113*   CALCIUM  7.8* 8.0*       : Damien FORBES Rubin DEVONNA  08/28/2024        [facility]:  [email or web address]  [email or web address]    [place] office:  [address] [place], [address]  [place], [place] [address]  Phone: [phone]  Fax :     [phone]    [facility] office:  [address]  [place], [place]  [address]  Phone - [phone]  Fax - [phone]                                                                                        "

## 2024-08-28 NOTE — Care Coordination (Addendum)
"  Care Management Progress Note    Reason for Admission:   Hypertensive emergency [I16.1]  Intractable nausea and vomiting [R11.2]         Patient Admission Status: Inpatient  RUR:  44%; LOS 21 days  Hospitalization in the last 30 days (Readmission):  No        Reportedly, pt resides with her husband and three daughters (ages 73, 14, 64).     Patient is Spanish speaking     Transition Plan of Care:  Nephrology, Vascular Surgery, ID, GI following for medical management - pt with clogged PEG and await IR for replacement per note on 11/17  Psych RN  - dx of Adjustment d/o with anxiety per note on 11/7  Plan is for pt to return home with family  ESRD - has HD at [facility] on M,W,F  ID orders for ABX at HD (Cefazolin  2 g dosed on Mondays and Wednesdays and 3 g dosed post HD on Fridays thru 09/19/2024.  Orders were faxed to [facility] HD by my colleague and again by me on 11/18 including latest flowsheets, AVS, summary.    Outpatient follow up  Family will transport pt home     No further discharge needs indicated.  D.Hebah Bogosian    "

## 2024-08-28 NOTE — Consults (Signed)
 Session ID: [id number]  Session Duration: 2 minutes  Language: Spanish  Interpreter ID: #[id number]  Interpreter Name: Sherlean

## 2024-08-28 NOTE — H&P (Signed)
 "Radiology History and Physical    Patient: Melinda [name] 43 y.o. female       Chief Complaint: Abdominal Pain and Nausea & Vomiting      History of Present Illness: 43 year old patient with chronic GJ tube here for exchange.    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  Social History     Socioeconomic History    Marital status: Married     Spouse name: Not on file    Number of children: Not on file    Years of education: Not on file    Highest education level: Not on file   Occupational History    Not on file   Tobacco Use    Smoking status: Never    Smokeless tobacco: Never   Vaping Use    Vaping status: Never Used   Substance and Sexual Activity    Alcohol use: Never    Drug use: Never    Sexual activity: Defer   Other Topics Concern    Not on file   Social History Narrative    Not on file     Social Drivers of Health     Financial Resource Strain: Low Risk  (05/02/2024)    Received from [facility]    Overall Financial Resource Strain (CARDIA)     Difficulty of Paying Living Expenses: Not hard at all   Recent Concern: Financial Resource Strain - High Risk (04/02/2024)    Received from [facility]    Overall Financial Resource Strain (CARDIA)     Difficulty of Paying Living Expenses: Hard   Food Insecurity: No Food Insecurity (08/06/2024)    Hunger Vital Sign     Worried About Running Out of Food in the Last Year: Never true     Ran Out of Food in the Last Year: Never true   Transportation Needs: No Transportation Needs (08/06/2024)    PRAPARE - Therapist, Art (Medical): No     Lack of Transportation (Non-Medical): No   Physical Activity: Not on file   Stress: Not on file   Social Connections: Not on file    Intimate Partner Violence: Not on file   Housing Stability: Low Risk  (08/06/2024)    Housing Stability Vital Sign     Unable to Pay for Housing in the Last Year: No     Number of Times Moved in the Last Year: 0     Homeless in the Last Year: No       Allergies: No Known Allergies    Current Medications:  Current Facility-Administered Medications   Medication Dose Route Frequency    hydrALAZINE  (APRESOLINE ) tablet 50 mg  50 mg Per G Tube TID    prochlorperazine  (COMPAZINE ) injection 5 mg  5 mg IntraVENous Once    ondansetron  (ZOFRAN -ODT) disintegrating tablet 4 mg  4 mg Oral Q8H PRN    Or    ondansetron  (ZOFRAN ) injection 4 mg  4 mg IntraVENous Q8H PRN    diazePAM  (VALIUM ) injection 2.5 mg  2.5 mg IntraVENous Q6H PRN  ceFAZolin  (ANCEF ) 2,000 mg in sterile water  20 mL IV syringe  2,000 mg IntraVENous Once per day on Monday Wednesday    [START ON 08/31/2024] ceFAZolin  (ANCEF ) 3,000 mg in sodium chloride  0.9 % 100 mL (addEASE)  3,000 mg IntraVENous Weekly    traMADol  (ULTRAM ) tablet 50 mg  50 mg PEG Tube Q12H PRN    acetaminophen  (TYLENOL ) tablet 1,000 mg  1,000 mg PEG Tube Q6H    insulin  glargine (LANTUS ) injection vial 10 Units  10 Units SubCUTAneous Nightly    insulin  lispro (HUMALOG ,ADMELOG ) injection vial 0-8 Units  0-8 Units SubCUTAneous 4 times per day    [Held by provider] cloNIDine  (CATAPRES ) 0.2 MG/24HR 1 patch  1 patch TransDERmal Weekly    lansoprazole  (PREVACID  SOLUTAB) disintegrating tablet 30 mg  30 mg Per G Tube BID    metoclopramide  (REGLAN ) 5 MG/5ML solution 5 mg  5 mg Per G Tube TID AC    prochlorperazine  (COMPAZINE ) suppository 25 mg  25 mg Rectal Q12H PRN    sodium chloride  flush 0.9 % injection 5-40 mL  5-40 mL IntraVENous 2 times per day    sodium chloride  flush 0.9 % injection 5-40 mL  5-40 mL IntraVENous PRN    lidocaine  PF 1 % injection 50 mg  50 mg IntraDERmal Once    dextrose  bolus 10% 125 mL  125 mL IntraVENous PRN    Or    dextrose  bolus 10% 250 mL  250 mL IntraVENous PRN    glucagon   injection 1 mg  1 mg SubCUTAneous PRN    dextrose  10 % infusion   IntraVENous Continuous PRN    glucose chewable tablet 16 g  4 tablet Per G Tube PRN    labetalol  (NORMODYNE ;TRANDATE ) injection 10 mg  10 mg IntraVENous Q4H PRN    dicyclomine  (BENTYL ) capsule 10 mg  10 mg PEG Tube TID AC    polyethylene glycol (GLYCOLAX ) packet 17 g  17 g Per G Tube Daily PRN    [Held by provider] sevelamer  (RENVELA ) packet 0.8 g  0.8 g Per G Tube TID WC    sodium bicarbonate  tablet 650 mg  650 mg PEG Tube TID    bisacodyl  (DULCOLAX) suppository 10 mg  10 mg Rectal Daily PRN    [Held by provider] epoetin  alfa-epbx (RETACRIT ) injection 10,000 Units  10,000 Units SubCUTAneous Q MWF    aspirin  chewable tablet 81 mg  81 mg Per G Tube Daily    atorvastatin  (LIPITOR ) tablet 80 mg  80 mg Per G Tube QHS    busPIRone  (BUSPAR ) tablet 5 mg  5 mg Per G Tube BID    clopidogrel  (PLAVIX ) tablet 75 mg  75 mg Per G Tube Daily    DULoxetine  (CYMBALTA ) extended release capsule 60 mg  60 mg Per G Tube Daily    famotidine  (PEPCID ) tablet 20 mg  20 mg Per G Tube Daily    sennosides-docusate sodium  (SENOKOT-S) 8.6-50 MG tablet 1 tablet  1 tablet Per G Tube BID    vitamin B-6 (PYRIDOXINE ) tablet 25 mg  25 mg Per G Tube Q6H    carvedilol  (COREG ) tablet 25 mg  25 mg Per G Tube BID WC    albumin  human 25% IV solution 25 g  25 g IntraVENous PRN    0.9 % sodium chloride  infusion   IntraVENous PRN    [Held by provider] heparin  (porcine) injection 5,000 Units  5,000 Units SubCUTAneous 3 times per day        Physical Exam:  Blood pressure (!) 168/72, pulse (!) 102, temperature 99.3 F (37.4 C), temperature source Oral, resp. rate 16, height 1.499 m (4' 11.02), weight 54.6 kg (120 lb 5.9 oz), SpO2 99%.  GENERAL: alert, cooperative, no distress, appears stated age,   LUNG: Nonlabored respiration on room air  HEART: Well perfused   EXT: No edema BLEs  ABD: Nondistended    Alerts:      Laboratory:      Recent Labs     08/27/24  0250 08/27/24  0854   HGB 9.7*  --     HCT 30.8*  --    WBC 6.0  --    PLT 346  --    BUN 51* 56*   K 3.9 3.9         Plan of Care/Planned Procedure:  Risks, benefits, and alternatives reviewed with patient and she agrees to proceed with the procedure.     GJ tube exchange.      GEANNIE Royden Ada, MD  [facility]  3:46 PM, 08/28/2024  "

## 2024-08-28 NOTE — Brief Op Note (Signed)
"  Brief Postoperative Note    Melinda [name]  Date of Birth:  [birth year]  [id number]    Pre-operative Diagnosis: GJ exchange    Post-operative Diagnosis: Same    Procedure: GJ exchange    Anesthesia: Local    Surgeons/Assistants: N. Royden Ada, MD     Estimated Blood Loss: less than 50     Complications: None    Specimens: Was Not Obtained    Findings: Successful exchange of GJ tube. Now a 22Fr, 45cm GJ tube. Ready for use.    Electronically signed by Mabel CHRISTELLA Ada, MD on 08/28/2024 at 4:19 PM    "

## 2024-08-28 NOTE — Discharge Summary (Addendum)
 "    Hospitalist Discharge Summary     Patient ID:  Laconda Basich  [id number]  43 y.o.  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    Gastroparesis with emesis abdominal pain s/p GJ tube: POA, acute on chronic. Reason for admission. Patient has chronic intractable N/V which lead to GJ placement, but sxs have continued. Normal abdominal imaging.   - switched from G to GJ  - noncompliant with NPO. Counseled on risks. She is compliant with tube feeds though  - PPI, pepcid , GI follow up  - reglan   - Bowel regimen, goal 1 BM per day. Suppository PRN if no BM     Hypertension   Labile BP  Vasovagal Syncope   POA. Chronic HTN with elevated Bps during earlier hospital course. BP labile, and hypotensive yesterday following HD. Ultimately resulting in suspected vasovagal syncopal episode 11/14. CT head negative. BP improved with albumin   - BP improved. Dizziness resolved.   - Continue home Coreg  and hydralazine  with parameters  - Nifedipine  discontinued (cannot be crushed)     Sepsis secondary to MSSA bacteremia  Evaluated by ID. Low suspicion however cannot rule out AV graft infected hematoma.  Vascular surgery followed care, no intervention planned   - Per ID, cefazolin  2 g dosed post HD Mondays and Wednesdays and 3 g  dosed post HD on Fridays. Currently patient is on cefazolin  1g QD through IV. She is a very hard stick with inconsistent IV access & limited access options due to ESRD. She will get abx during HD sessions.     Alpha Streptococcus isolated from urine   Evaluated by ID. Likely colonization   - Abx as above     Prolonged QT  Qtc 503. 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 Phosphorus now normal     ESRD  On HD  - Nephrology evaluated. Outpt follow up     GERD  Chronic.   - Continue Pepcid  and PPI     T2DM with gastroparesis.   POA, chronic.  Last A1c of 6.1 on 9/17.    - Sliding scale insulin   - BG rising with tube feeds, started on lantus . Monitor and titrate PRN      Chronic Back pain   No e/o discitis, noted DDD and Moderate to severe canal stenosis at C4-5 and moderate canal stenosis at C3-C4   - Duloxetine   - PCP follow up  - pain control with OTCs     Anxiety/depression  Adjustment disorder in setting of medical issues/prolonged stay  There was concern about her flat affect, not caring for self (urinating on self, refusing to be  cleaned), noncompliance with medical plan. Evaluated by psychiatry (11/7), their assessment was not consistent with self-induced vomiting, eating disorder, or psychotic process   - Duloxetine  and BuSpar .  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   - Continue plavix  and ASA  - Continue statin      Anemia  Mild and intermittent during hospital stay from 9 - 12. In setting of acute illness and ESRD  - Stable   - ESA per nephrology      Fentanyl  positive  Seen on drug screen. May be a false positive in the setting of Benadryl  use. Patient adamantly denies use.        In caring for this patient today, I reviewed recent notes, interpreted lab results and imaging tests, had a face-to-face encounter with the  patient, performing the medically necessary appropriate exam and history, counseled the patient/family/caregiver, ordered medications, tests and procedures as indicated for diagnostic and therapeutic purposes, consulted and communicated with other healthcare professionals including care coordination and discharge planning, and documented clinical information in the electronic health record.    Imaging  CT HEAD WO CONTRAST  Result Date: 08/24/2024  No acute process. Electronically signed by Shlomo Kerns    XR ABDOMEN (KUB) (SINGLE AP VIEW)  Result Date: 08/22/2024  Gastrojejunostomy tube appears to be in good position. No bowel obstruction. Hyperattenuating contrast versus stool is new in the nondistended colon. Electronically signed by Marolyn Boroughs       PCP: No primary care provider on file.     Consults: GI, ID, nephro    Condition of patient at discharge: Improved    Discharge Exam:    Physical Exam:      Gen:  Well-developed, well-nourished, in no acute distress  HEENT:  Pink conjunctivae, EOMI, hearing intact to voice, moist mucous membranes  Resp:  No accessory muscle use, clear breath sounds without wheezes rales or rhonchi  Card:  No murmurs, normal S1, S2 without thrills, bruits or peripheral edema  Abd:  Soft, generalized mild tenderness to palpation. PEG in place  Musc:  No cyanosis or clubbing  Skin:  No rashes or ulcers, skin turgor is good  Neuro:  follows commands appropriately  Psych:  Good insight, oriented to person, place and time, alert         Disposition: home    Patient Instructions:   Current Discharge Medication List        START taking these medications    Details   ceFAZolin  (ANCEF ) 500 MG injection cefazolin  2/2/3 post HD until 09/19/24  Qty: 1 each, Refills: 0  Start date: 08/28/2024      dicyclomine  (BENTYL ) 10 MG capsule 1 capsule by Per J Tube route 3 times daily (before meals)  Qty: 120 capsule, Refills: 3  Start date: 08/28/2024      insulin  glargine (LANTUS ) 100 UNIT/ML injection  vial Inject 10 Units into the skin nightly  Qty: 10 mL, Refills: 3  Start date: 08/28/2024      lansoprazole  (PREVACID  SOLUTAB) 30 MG disintegrating tablet 1 tablet by Per J Tube route 2 times daily  Qty: 30 tablet, Refills: 3  Start date: 08/28/2024      metoclopramide  (REGLAN ) 5 MG/5ML solution 5 mLs by Per J Tube route 3 times daily (before meals)  Qty: 750 mL, Refills: 3  Start date: 08/28/2024  CONTINUE these medications which have CHANGED    Details   aspirin  81 MG chewable tablet 1 tablet by Per J Tube route daily  Qty: 30 tablet, Refills: 3  Start date: 08/29/2024      atorvastatin  (LIPITOR ) 80 MG tablet 1 tablet by Per J Tube route nightly  Qty: 30 tablet, Refills: 3  Start date: 08/28/2024      busPIRone  (BUSPAR ) 5 MG tablet 1 tablet by Per J Tube route in the morning and at bedtime  Qty: 90 tablet, Refills: 0  Start date: 08/28/2024      carvedilol  (COREG ) 25 MG tablet 1 tablet by Per J Tube route 2 times daily (with meals)  Qty: 60 tablet, Refills: 3  Start date: 08/28/2024      clopidogrel  (PLAVIX ) 75 MG tablet 1 tablet by Per J Tube route daily  Qty: 30 tablet, Refills: 3  Start date: 08/29/2024      DULoxetine  (CYMBALTA ) 60 MG extended release capsule 1 capsule by Per G Tube route daily  Qty: 30 capsule, Refills: 3  Start date: 08/29/2024      famotidine  (PEPCID ) 20 MG tablet 1 tablet by Per J Tube route daily  Qty: 60 tablet, Refills: 3  Start date: 08/29/2024      hydrALAZINE  (APRESOLINE ) 50 MG tablet 1 tablet by Per J Tube route 3 times daily  Qty: 90 tablet, Refills: 3  Start date: 08/28/2024      sennosides-docusate sodium  (SENOKOT-S) 8.6-50 MG tablet 1 tablet by Per J Tube route in the morning and at bedtime  Qty: 30 tablet, Refills: 0  Start date: 08/28/2024      sodium bicarbonate  650 MG tablet 1 tablet by Per J Tube route 3 times daily  Qty: 90 tablet, Refills: 0  Start date: 08/28/2024           STOP taking these medications       metoclopramide  (REGLAN ) 5 MG tablet Comments:    Reason for Stopping:         lidocaine  4 % external patch Comments:   Reason for Stopping:         sevelamer  (RENVELA ) 0.8 g PACK packet Comments:   Reason for Stopping:         pantoprazole  (PROTONIX ) 40 MG tablet Comments:   Reason for Stopping:         ondansetron  (ZOFRAN -ODT) 4 MG disintegrating tablet Comments:   Reason for Stopping:         prochlorperazine  (COMPAZINE ) 10 MG tablet Comments:   Reason for Stopping:         metFORMIN  (GLUCOPHAGE -XR) 500 MG extended release tablet Comments:   Reason for Stopping:         amLODIPine  (NORVASC ) 10 MG tablet Comments:   Reason for Stopping:             Activity: activity as tolerated  Diet: NPO  Wound Care: keep wound clean and dry    Follow-up with PCP, nephro, GI, ID  Follow-up tests/labs CBC, CMP    Approximate time spent in patient care on day of discharge: 45 minutes    Signed:  Othella Crock, MD  08/28/2024  11:05 AM    "

## 2024-08-28 NOTE — Progress Notes (Signed)
"  EPIC discharge instructions printed out and given to patient. Discharge education performed at bedside with patine tand her daughters. Discharge education included, but was not limited to, admitting diagnosis continuation of care, discharge prescriptions, follow-up appointments, and return signs and symptoms. Patient ambulated off of 5th Floor with her daughters to private vehicle.    "

## 2024-08-28 NOTE — Progress Notes (Signed)
"  TRANSFER - OUT REPORT:    Verbal report given to Venetia, RN(name) on Melinda [name] being transferred to 5th Floor(unit) for routine post-op       Report consisted of patient's Situation, Background, Assessment and   Recommendations(SBAR).     Information from the following report(s) Nurse Handoff Report was reviewed with the receiving nurse.    Opportunity for questions and clarification was provided.      Patient transported with:   Tech    "

## 2024-08-29 LAB — IR GUIDED REPLACE DUOD OR JEJUN TUBE W CONTRAST: Body Surface Area: 1.51 m2

## 2024-09-10 ENCOUNTER — Inpatient Hospital Stay
Admission: EM | Admit: 2024-09-10 | Discharge: 2024-09-12 | Disposition: A | Discharge status: Home / Self Care | Payer: PRIVATE HEALTH INSURANCE | Attending: Internal Medicine | Admitting: Internal Medicine

## 2024-09-10 ENCOUNTER — Emergency Department: Admit: 2024-09-11 | Payer: PRIVATE HEALTH INSURANCE

## 2024-09-10 DIAGNOSIS — R1084 Generalized abdominal pain: Principal | ICD-10-CM

## 2024-09-10 DIAGNOSIS — E1143 Type 2 diabetes mellitus with diabetic autonomic (poly)neuropathy: Secondary | ICD-10-CM

## 2024-09-10 MED ORDER — HYDROMORPHONE HCL PF 1 MG/ML IJ SOLN
1 | INTRAMUSCULAR | Status: AC
Start: 2024-09-10 — End: 2024-09-10
  Administered 2024-09-11: 0.5 mg via INTRAVENOUS

## 2024-09-10 NOTE — Progress Notes (Signed)
"  Pharmacy Dosing    Reglan  dose decreased from 10 mg to 5 mg per protocol.   - CrCl 12 ml/min    Thank you, Jannett Blanch PharmD (x [phone])    "

## 2024-09-10 NOTE — ED Provider Notes (Signed)
 "      [facility] EMERGENCY DEPARTMENT  EMERGENCY DEPARTMENT ENCOUNTER      Pt Name: Melinda [name]  MRN: [id number]  Birthdate 1980-11-17  Date of evaluation: 09/10/2024  Provider: Dorothyann Shove, MD    CHIEF COMPLAINT       Chief Complaint   Patient presents with    Abdominal Pain    Back Pain         HISTORY OF PRESENT ILLNESS    Melinda [name] is a 43 yo F with HTN, type 2 DM, gastroparesis, CKD on HD MWF who is complaining of increased upper abdominal pain that radiates to back.  It has been going on for 3 days and is keeping her up at night which is increasing her anxiety.  She states she has been taking her prescribed medications as well as 2 tylenol  but the pain has not improved.  She denies vomiting.            Additional history from independent historians:     Review of External Medical Records:     Nursing Notes were reviewed.    REVIEW OF SYSTEMS       Review of Systems    Except as noted above the remainder of the review of systems was reviewed and negative.       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)     Gastroparesis     Gastroparesis     GERD (gastroesophageal reflux disease)     High cholesterol     Hypertension          SURGICAL HISTORY       Past Surgical History:   Procedure Laterality Date    CAPSULE ENDOSCOPY N/A 01/20/2023    ESOPHAGEAL CAPSULE ENDOSCOPY remove at 1624PM performed by Madlyn Fendt, MD at [facility] ENDOSCOPY    CHOLECYSTECTOMY, LAPAROSCOPIC N/A 02/03/2023    ROBOTIC LAPAROSCOPIC CHOLECYSTECTOMY with Indocyanine green  performed by Chrystal Elijah BRAVO, MD at [facility] MAIN OR    COLONOSCOPY N/A 01/19/2023    COLONOSCOPY DIAGNOSTIC performed by Madlyn Fendt, MD at [facility] ENDOSCOPY    INVASIVE VASCULAR N/A 10/03/2023    Angiography visceral SMA performed by Millard Oneil LABOR, MD at [facility] CARDIAC CATH LAB    INVASIVE VASCULAR N/A 10/03/2023    Ultrasound guided vascular  access performed by Millard Oneil LABOR, MD at [facility] CARDIAC CATH LAB    INVASIVE VASCULAR N/A 10/03/2023    Insert stent peripheral artery performed by Millard Oneil LABOR, MD at [facility] CARDIAC CATH LAB    IR NONTUNNELED VASCULAR CATHETER > 5 YEARS  10/10/2023    IR NONTUNNELED VASCULAR CATHETER > 5 YEARS 10/10/2023 [facility] CARDIAC CATH/EP/IR LAB    IR NONTUNNELED VASCULAR CATHETER > 5 YEARS  10/10/2023    IR NONTUNNELED VASCULAR CATHETER > 5 YEARS 10/10/2023 [facility] CARDIAC CATH/EP/IR LAB    OTHER SURGICAL HISTORY Left     Rentia attachment    TUBAL LIGATION Bilateral     UPPER GASTROINTESTINAL ENDOSCOPY N/A 01/17/2023    ESOPHAGOGASTRODUODENOSCOPY performed by Madlyn Fendt, MD at [facility] ENDOSCOPY    UPPER GASTROINTESTINAL ENDOSCOPY N/A 01/17/2023    ESOPHAGOGASTRODUODENOSCOPY BIOPSY performed by Madlyn Fendt, MD at [facility] ENDOSCOPY    UPPER GASTROINTESTINAL ENDOSCOPY N/A 01/18/2023    ESOPHAGOGASTRODUODENOSCOPY performed by Madlyn Fendt, MD at [facility] ENDOSCOPY  UPPER GASTROINTESTINAL ENDOSCOPY N/A 05/13/2023    ESOPHAGOGASTRODUODENOSCOPY performed by Roseann Lonni BIRCH, MD at [facility] ENDOSCOPY    UPPER GASTROINTESTINAL ENDOSCOPY N/A 05/13/2023    ESOPHAGOGASTRODUODENOSCOPY BIOPSY performed by Roseann Lonni BIRCH, MD at [facility] ENDOSCOPY    UPPER GASTROINTESTINAL ENDOSCOPY N/A 09/21/2023    ESOPHAGOGASTRODUODENOSCOPY performed by Madlyn Fendt, MD at [facility] ENDOSCOPY    UPPER GASTROINTESTINAL ENDOSCOPY N/A 09/21/2023    ESOPHAGOGASTRODUODENOSCOPY BIOPSY performed by Madlyn Fendt, MD at [facility] ENDOSCOPY    US  FLUID COLLECTION DRAINAGE PERITONEAL/RETROPERITONEAL [facility]  02/11/2023    US  ABSCESS DRAINAGE PERITONEAL 02/11/2023 [facility] RAD US          CURRENT MEDICATIONS       Previous Medications    ASPIRIN  81 MG CHEWABLE TABLET    1 tablet by Per J Tube route daily    ATORVASTATIN  (LIPITOR ) 80 MG TABLET    1 tablet by Per J Tube route nightly    BUSPIRONE  (BUSPAR ) 5 MG TABLET    1 tablet by Per J Tube route in the morning and at bedtime    CARVEDILOL  (COREG ) 25 MG TABLET     1 tablet by Per J Tube route 2 times daily (with meals)    CEFAZOLIN  (ANCEF ) 500 MG INJECTION    cefazolin  2/2/3 post HD until 09/19/24    CLOPIDOGREL  (PLAVIX ) 75 MG TABLET    1 tablet by Per J Tube route daily    DICYCLOMINE  (BENTYL ) 10 MG CAPSULE    1 capsule by Per J Tube route 3 times daily (before meals)    DULOXETINE  (CYMBALTA ) 60 MG EXTENDED RELEASE CAPSULE    1 capsule by Per G Tube route daily    FAMOTIDINE  (PEPCID ) 20 MG TABLET    1 tablet by Per J Tube route daily    HYDRALAZINE  (APRESOLINE ) 50 MG TABLET    1 tablet by Per J Tube route 3 times daily    INSULIN  GLARGINE (LANTUS ) 100 UNIT/ML INJECTION VIAL    Inject 10 Units into the skin nightly    LANSOPRAZOLE  (PREVACID  SOLUTAB) 30 MG DISINTEGRATING TABLET    1 tablet by Per J Tube route 2 times daily    METOCLOPRAMIDE  (REGLAN ) 5 MG/5ML SOLUTION    5 mLs by Per J Tube route 3 times daily (before meals)    SENNOSIDES-DOCUSATE SODIUM  (SENOKOT-S) 8.6-50 MG TABLET    1 tablet by Per J Tube route in the morning and at bedtime    SODIUM BICARBONATE  650 MG TABLET    1 tablet by Per J Tube route 3 times daily       ALLERGIES     Patient has no known allergies.    FAMILY HISTORY     No family history on file.       SOCIAL HISTORY       Social History     Socioeconomic History    Marital status: Married   Tobacco Use    Smoking status: Never    Smokeless tobacco: Never   Vaping Use    Vaping status: Never Used   Substance and Sexual Activity    Alcohol use: Never    Drug use: Never    Sexual activity: Defer     Social Drivers of Health     Financial Resource Strain: Low Risk  (05/02/2024)    Received from [facility]    Overall Financial Resource Strain (CARDIA)     Difficulty of Paying Living Expenses: Not hard at all   Recent  Concern: Financial Resource Strain - High Risk (04/02/2024)    Received from [facility]    Overall Financial Resource Strain (CARDIA)     Difficulty of Paying Living Expenses: Hard   Food Insecurity: No  Food Insecurity (08/06/2024)    Hunger Vital Sign     Worried About Running Out of Food in the Last Year: Never true     Ran Out of Food in the Last Year: Never true   Transportation Needs: No Transportation Needs (08/06/2024)    PRAPARE - Therapist, Art (Medical): No     Lack of Transportation (Non-Medical): No   Housing Stability: Low Risk  (08/06/2024)    Housing Stability Vital Sign     Unable to Pay for Housing in the Last Year: No     Number of Times Moved in the Last Year: 0     Homeless in the Last Year: No         PHYSICAL EXAM       ED Triage Vitals [09/10/24 1829]   BP Girls Systolic BP Percentile Girls Diastolic BP Percentile Boys Systolic BP Percentile Boys Diastolic BP Percentile Temp Temp Source Pulse   (!) 174/109 -- -- -- -- 98.4 F (36.9 C) Oral 89      Respirations SpO2 Height Weight       20 100 % -- --           There is no height or weight on file to calculate BMI.    Physical Exam  Vitals and nursing note reviewed.   Constitutional:       General: She is not in acute distress.  HENT:      Head: Normocephalic and atraumatic.      Mouth/Throat:      Mouth: Mucous membranes are moist.   Eyes:      Extraocular Movements: Extraocular movements intact.      Conjunctiva/sclera: Conjunctivae normal.   Cardiovascular:      Rate and Rhythm: Normal rate.   Pulmonary:      Effort: Pulmonary effort is normal. No respiratory distress.   Abdominal:      General: There is no distension.      Tenderness: There is abdominal tenderness in the epigastric area.   Musculoskeletal:         General: No deformity.      Cervical back: Normal range of motion.   Skin:     General: Skin is warm and dry.   Neurological:      General: No focal deficit present.      Mental Status: She is alert.         DIAGNOSTIC RESULTS     EKG: All EKG's are interpreted by the Emergency Department Physician listed in the interpretation in the absence of a cardiologist and may also be found below under  Reassessment/ED Course.    ED EKG Interpretation:  Time: 6:49 PM  Rhythm: normal sinus rhythm; and regular . Rate (approx.): 89; Axis: left axis deviation; P wave: normal; QRS interval: normal ; ST/T wave: non-specific changes, prolonged QT QTc 508; Other findings: abnormal.  EKG interpreted by Dorothyann Shove, MD.        RADIOLOGY:   Non-plain film images such as CT, Ultrasound and MRI are read by the radiologist. Plain radiographic images are visualized and preliminarily interpreted by the emergency physician with the below findings:    Interpretation per the Radiologist below, if available at  the time of this note:    CT ABDOMEN PELVIS WO CONTRAST Additional Contrast? None   Final Result   1. Very small right pleural effusion, new compared to prior CT dated August 06, 2024.   2. Percutaneous GJ tube extends to the jejunum. No bowel obstruction, ileus or   perforation. No intra-abdominal abscess.   3. Small amount of free intraperitoneal fluid, new compared to prior CT dated   October 2025.      Electronically signed by Krystal Grippe, MD           LABS:  Labs Reviewed   CBC WITH AUTO DIFFERENTIAL - Abnormal; Notable for the following components:       Result Value    RBC 2.76 (*)     Hemoglobin 8.5 (*)     Hematocrit 27.6 (*)     MCV 100.0 (*)     RDW 18.3 (*)     Lymphocytes % 11.3 (*)     Eosinophils % 7.1 (*)     Eosinophils Absolute 0.56 (*)     All other components within normal limits   COMPREHENSIVE METABOLIC PANEL - Abnormal; Notable for the following components:    Sodium 131 (*)     Chloride 97 (*)     Glucose 356 (*)     BUN 36 (*)     Creatinine 4.64 (*)     BUN/Creatinine Ratio 8 (*)     Est, Glom Filt Rate 11 (*)     Calcium  7.9 (*)     AST 44 (*)     Alk Phosphatase 241 (*)     Total Protein 6.1 (*)     Albumin  2.4 (*)     Albumin /Globulin Ratio 0.6 (*)     All other components within normal limits   TROPONIN - Abnormal; Notable for the following components:    Troponin T 119.0 (*)     All other  components within normal limits   TROPONIN - Abnormal; Notable for the following components:    Troponin T 112.0 (*)     All other components within normal limits   POCT GLUCOSE - Abnormal; Notable for the following components:    POC Glucose 266 (*)     All other components within normal limits   MAGNESIUM    LIPASE   EXTRA TUBES HOLD   TROPONIN   TROPONIN   TROPONIN   CBC WITH AUTO DIFFERENTIAL   COMPREHENSIVE METABOLIC PANEL   URINE DRUG SCREEN   POCT GLUCOSE       All other labs were within normal range or not returned as of this dictation.    EMERGENCY DEPARTMENT COURSE and DIFFERENTIAL DIAGNOSIS/MDM:   Vitals:    Vitals:    09/10/24 1829   BP: (!) 174/109   Pulse: 89   Resp: 20   Temp: 98.4 F (36.9 C)   TempSrc: Oral   SpO2: 100%           Medical Decision Making  Amount and/or Complexity of Data Reviewed  Labs: ordered.  Radiology: ordered.  ECG/medicine tests: ordered.    Risk  Prescription drug management.  Decision regarding hospitalization.            REASSESSMENT          CRITICAL CARE TIME   Total Critical Care time was 35 minutes, excluding separately reportable procedures.     CONSULTS:  None    PROCEDURES:  Unless otherwise noted below, none  Procedures        FINAL IMPRESSION      1. Generalized abdominal pain    2. Hypertensive urgency          DISPOSITION/PLAN   DISPOSITION      Perfect Serve Consult for Admission  9:06 PM    ED Room Number: ER21/21  Patient Name and age:  Tanzie Rothschild Toto-Ambros 43 y.o.  female  Working Diagnosis:   1. Generalized abdominal pain    2. Hypertensive urgency        COVID-19 Suspicion: No  Sepsis present:  No  Reassessment needed: No  Code Status:  Full Code  Readmission: Yes  Isolation Requirements: no  Recommended Level of Care: step down  Department: [facility] - [phone]  Consulting Provider: Laquetta    Other:  h/o DM, gastroparesis admitted on 10/27 with hypertensive emergency and intractable nausea and vomiting and discharged on 11/18.  Presents  back to ED with abdominal pain and nausea x 3 days and HTN.  Patient states she has taken her prescribed BP medications.    Received 10mg  labetalol  IV for BP 217/102, now BP 193/85, 2nd dose ordered. CT abd with small right pleural effusion and GJ tube in jejunum with no obstruction or abdominal abscesses.          PATIENT REFERRED TO:  No follow-up provider specified.    DISCHARGE MEDICATIONS:  New Prescriptions    No medications on file         (Please note that portions of this note were completed with a voice recognition program.  Efforts were made to edit the dictations but occasionally words are mis-transcribed.)    Dorothyann Shove, MD (electronically signed)  Emergency Attending Physician            Shove Dorothyann BROCKS, MD  09/10/24 2250    "

## 2024-09-10 NOTE — ED Notes (Signed)
"  TRANSFER - OUT REPORT:    Verbal report given to Albino, RN on Melinda [name]  being transferred to 310 for routine progression of patient care       Report consisted of patient's Situation, Background, Assessment and   Recommendations(SBAR).     Information from the following report(s) Nurse Handoff Report, ED Encounter Summary, ED SBAR, Adult Overview, MAR, Recent Results, Quality Measures, Neuro Assessment, and Event Log was reviewed with the receiving nurse.    Kinder Fall Assessment:    Presents to emergency department  because of falls (Syncope, seizure, or loss of consciousness): No  Age > 70: No  Altered Mental Status, Intoxication with alcohol or substance confusion (Disorientation, impaired judgment, poor safety awaremess, or inability to follow instructions): No  Impaired Mobility: Ambulates or transfers with assistive devices or assistance; Unable to ambulate or transer.: No  Nursing Judgement: No          Lines:   Peripheral IV 09/10/24 Right Antecubital (Active)        Opportunity for questions and clarification was provided.      Patient transported with:  Monitor, O2 @ 2lpm, and Tech        "

## 2024-09-10 NOTE — Consults (Signed)
 Session ID: [id number]  Session Duration: 17 minutes  Language: Spanish  Interpreter ID: #[id number]  Interpreter Name: Ubaldo

## 2024-09-10 NOTE — ED Triage Notes (Signed)
"  Pt arrives via POV w/ cc of generalized back and abdominal pain that started 3 days ago. Patient does have a feeding tube. Pt is endorsing anxiety. Patient states she took tylenol  2 hours ago with no relief.   Pt denies chest pain, SOB, GI/GU issues.     PM[facility] dialysis (M,W,F)  "

## 2024-09-10 NOTE — ED Notes (Signed)
"  Patient's BP reading as 213/93, on re-check BP 217/102. MD aware.   "

## 2024-09-10 NOTE — ED Notes (Signed)
"  UA ordered, when attempting to collect patient stated to primary RN I don't make urine anymore. MD aware.   "

## 2024-09-10 NOTE — H&P (Signed)
 "                   [facility]          [phone] ST. [address] [place], [place]  [address]                           HISTORY & PHYSICAL      PATIENT NAME: Melinda [name], Melinda [name]       DOB: 1980/12/17  MED REC NO: [id number]                       ROOM: ER21  ACCOUNT NO: [id number]                       ADMIT DATE: 09/10/2024  PROVIDER: Ellouise Bathe, MD    PRESENTING COMPLAINT:    Vomiting, abdominal pain, high blood pressure.    HISTORY OF PRESENT ILLNESS:      The patient is a 43 year old female who has end-stage renal disease, type 2 diabetes, history of gastroparesis, history of uncontrolled hypertension.  She was recently admitted to this hospital from 10/27 till 11/18.  Her last hospital admission was due to gastroparesis, abdominal pain, labile hypertension and alpha streptococcal growth in urine.  The patient is on hemodialysis on Monday, Wednesday, Friday.  The patient had a recent exchange of GJ tube, now she has a 22-French tube, which was exchanged on 11/18.    Today, the patient comes in because she says she is hurting all over.  She is very anxious.  She tells me she has not been able to sleep for the last 3 days.  She tells me she did get dialysis today.  She denies having any chest pain or shortness of breath.  In the ER, CT scan of the abdomen was unremarkable.  Her troponin was elevated as well as her blood pressure was elevated.  The patient's blood pressure was 200/90.  She was given IV diazepam  and 10 mg of labetalol  and we were asked to admit her.  The patient denies any other complaints.  She does take p.o. and also uses her tube. She denies any diarrhea.     CURRENT MEDICATIONS:  The patient is not clear, however, it looks like she is on Ancef  through hemodialysis until 12/10.  She is also on Lantus  10 units at night, Prevacid  30 mg daily, Reglan  5 mg 3 times daily, baby aspirin  81 mg daily, Lipitor  80 daily, BuSpar  5 daily, carvedilol  25 twice daily, Plavix  75 daily, Cymbalta  60  daily, Pepcid  20 daily, hydralazine  50 mg 3 times daily, sodium bicarbonate  650 three times daily.    REVIEW OF SYSTEMS:  Negative except as mentioned in history of present illness.  All systems reviewed.  No other positive finding was noticed.    PHYSICAL EXAMINATION:  GENERAL:  The patient is a 43 year old female, looks anxious, not in any acute distress.    VITAL SIGNS:  Temp 98.4, blood pressure 200/90, pulse is 78, saturating 99%.  HEENT:  Pupils equally reactive to light and accommodation.  NECK:  Supple.  There is no lymphadenopathy or JVD.  CHEST:  Clear.  No wheezing.  No crackles.  CARDIOVASCULAR SYSTEM:  S1 and S2.  Regular.  No murmur.  No S3.  ABDOMEN:  Reveals tube is in site.  No significant tenderness or guarding.  CNS:  Nonfocal.  Psych wise, the patient  looks anxious.    LABORATORY DATA:  White count 7.9, hemoglobin is 8.5, hematocrit 27.6, platelet count is 381,000.  Magnesium  is 1.9.  Troponin T is 119.  Sodium 131, potassium is 4.3, chloride 97, bicarb 21, glucose 356, BUN is 36, creatinine is 4.64, calcium  is 7.9, bilirubin 0.3, ALT 24, AST 44, alkaline phosphatase 241.    CT of abdomen pelvis without contrast reveals very small right pleural effusion.  Percutaneous GJ tube extends to jejunum.  No bowel obstruction, ileus, or perforation.  No intraabdominal abscess.  Small amount of free intraperitoneal fluid new compared to prior CT.     EKG shows normal sinus rhythm with left axis deviation, nonspecific T-wave abnormality.    ASSESSMENT AND PLAN:    The patient is a 43 year old female who has multiple medical problems as mentioned above, is admitted with    1. Abdominal pain, vomiting.  Exact etiology is not clear.  However, this may be due to gastroparesis.  We will start her on IV Reglan .  Continue PPI.  2. Uncontrolled Hypertension.  During her last hospital admission, she had uncontrolled hypertension as well she probably is not keeping her Oral BP meds down .  It looks like the patient  is very anxious.  We will give her one more dose of Valium   tonight and start her on IV Hydralazine   goal to bring SBP < 160. Resume PO BP meds once stops vomiting.  3. End-stage renal disease, on dialysis.  We will consult Nephrology.  The patient was dialyzed today.  4. History of type 2 diabetes with gastroparesis.  We will monitor blood sugar.  5. 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  Mesenteric MRA done in April of 2025 showed distally patent SMA.  The patient is on Plavix  and aspirin .  Consider repeating MRA with IV Contrast if her vomiting and abdominal pain continues. GI consulted.  6. History of anemia due to chronic renal failure.  We will monitor.  7. DVT prophylaxis.  8. Elevated Troponin without chest pain. If Troponin goes higher may need Cardiology consult. Echo ordered.  9. Check UDS  10. Anxiety : Ordered Valium  may need Psychiatry evaluation . Continue Buspar .    35 minutes of critical care time spent on seeing this patient.        ELLOUISE BATHE, MD      SK/AQS  D:  09/10/2024 22:33:25  T:  09/10/2024 22:48:28  JOB #:  [id number]     "

## 2024-09-11 ENCOUNTER — Inpatient Hospital Stay: Admit: 2024-09-11 | Payer: PRIVATE HEALTH INSURANCE

## 2024-09-11 LAB — ECHO (TTE) COMPLETE (PRN CONTRAST/BUBBLE/STRAIN/3D)
AV Area by Peak Velocity: 2.5 cm2
AV Area by VTI: 2.3 cm2
AV Mean Gradient: 6 mmHg
AV Mean Velocity: 1.2 m/s
AV Peak Gradient: 10 mmHg
AV Peak Velocity: 1.6 m/s
AV VTI: 35.1 cm
AV Velocity Ratio: 0.81
AVA/BSA Peak Velocity: 1.6 cm2/m2
AVA/BSA VTI: 1.5 cm2/m2
Ao Root Index: 1.79 cm/m2
Aortic Root: 2.8 cm
Ascending Aorta Index: 1.73 cm/m2
Ascending Aorta: 2.7 cm
Body Surface Area: 1.6 m2
E/E' Lateral: 25.67
E/E' Ratio (Averaged): 24.61
E/E' Septal: 23.54
EF BP: 55 % (ref 55–100)
EF Physician: 55 %
Est. RA Pressure: 15 mmHg
Fractional Shortening 2D: 30 % (ref 28–44)
IVSd: 1.1 cm — AB (ref 0.6–0.9)
LA Volume A-L A4C: 71 mL — AB (ref 22–52)
LA Volume A-L A4C: 93 mL — AB (ref 22–52)
LA Volume A/L: 84 mL
LA Volume BP: 80 mL — AB (ref 22–52)
LA Volume Index A-L A2C: 60 mL/m2 — AB (ref 16–34)
LA Volume Index A-L A4C: 46 mL/m2 — AB (ref 16–34)
LA Volume Index A/L: 54 mL/m2 (ref 16–34)
LA Volume Index BP: 51 mL/m2 — AB (ref 16–34)
LA Volume Index MOD A2C: 56 mL/m2 — AB (ref 16–34)
LA Volume Index MOD A4C: 43 mL/m2 — AB (ref 16–34)
LA Volume MOD A2C: 88 mL — AB (ref 22–52)
LA Volume MOD A4C: 67 mL — AB (ref 22–52)
LV E' Lateral Velocity: 4.09 cm/s
LV E' Septal Velocity: 4.46 cm/s
LV EDV A2C: 101 mL
LV EDV A4C: 106 mL
LV EDV BP: 107 mL — AB (ref 56–104)
LV EDV Index A2C: 65 mL/m2
LV EDV Index A4C: 68 mL/m2
LV EDV Index BP: 69 mL/m2
LV ESV A2C: 44 mL
LV ESV A4C: 49 mL
LV ESV BP: 48 mL (ref 19–49)
LV ESV Index A2C: 28 mL/m2
LV ESV Index A4C: 31 mL/m2
LV ESV Index BP: 31 mL/m2
LV Ejection Fraction A2C: 57 %
LV Ejection Fraction A4C: 54 %
LV Mass 2D Index: 141.2 g/m2 — AB (ref 43–95)
LV Mass 2D: 220.3 g — AB (ref 67–162)
LV RWT Ratio: 0.48
LVIDd Index: 3.21 cm/m2
LVIDd: 5 cm (ref 3.9–5.3)
LVIDs Index: 2.24 cm/m2
LVIDs: 3.5 cm
LVOT Area: 3.1 cm2
LVOT Diameter: 2 cm
LVOT Mean Gradient: 3 mmHg
LVOT Peak Gradient: 6 mmHg
LVOT Peak Velocity: 1.3 m/s
LVOT SV: 80.1 mL
LVOT Stroke Volume Index: 51.3 mL/m2
LVOT VTI: 25.5 cm
LVOT:AV VTI Index: 0.73
LVPWd: 1.2 cm — AB (ref 0.6–0.9)
MV A Velocity: 0.66 m/s
MV E Velocity: 1.05 m/s
MV E Wave Deceleration Time: 196.3 ms
MV E/A: 1.59
RV Free Wall Peak S': 11 cm/s
RVSP: 44 mmHg
TAPSE: 1.8 cm (ref 1.7–?)
TR Max Velocity: 2.69 m/s
TR Peak Gradient: 29 mmHg

## 2024-09-11 LAB — CBC WITH AUTO DIFFERENTIAL
Basophils %: 0.5 % (ref 0.0–1.0)
Basophils %: 0.7 % (ref 0.0–1.0)
Basophils Absolute: 0.04 K/UL (ref 0.00–0.10)
Basophils Absolute: 0.05 K/UL (ref 0.00–0.10)
Eosinophils %: 7.1 % — ABNORMAL HIGH (ref 0.0–7.0)
Eosinophils %: 8.1 % — ABNORMAL HIGH (ref 0.0–7.0)
Eosinophils Absolute: 0.56 K/UL — ABNORMAL HIGH (ref 0.00–0.40)
Eosinophils Absolute: 0.6 K/UL — ABNORMAL HIGH (ref 0.00–0.40)
Hematocrit: 27.3 % — ABNORMAL LOW (ref 35.0–47.0)
Hematocrit: 27.6 % — ABNORMAL LOW (ref 35.0–47.0)
Hemoglobin: 8.3 g/dL — ABNORMAL LOW (ref 11.5–16.0)
Hemoglobin: 8.5 g/dL — ABNORMAL LOW (ref 11.5–16.0)
Immature Granulocytes %: 0.1 % (ref 0.0–0.5)
Immature Granulocytes %: 0.4 % (ref 0.0–0.5)
Immature Granulocytes Absolute: 0.01 K/UL (ref 0.00–0.04)
Immature Granulocytes Absolute: 0.03 K/UL (ref 0.00–0.04)
Lymphocytes %: 11.3 % — ABNORMAL LOW (ref 12.0–49.0)
Lymphocytes %: 21.7 % (ref 12.0–49.0)
Lymphocytes Absolute: 0.89 K/UL (ref 0.80–3.50)
Lymphocytes Absolute: 1.62 K/UL (ref 0.80–3.50)
MCH: 30.8 pg (ref 26.0–34.0)
MCH: 30.9 pg (ref 26.0–34.0)
MCHC: 30.4 g/dL (ref 30.0–36.5)
MCHC: 30.8 g/dL (ref 30.0–36.5)
MCV: 100 FL — ABNORMAL HIGH (ref 80.0–99.0)
MCV: 101.5 FL — ABNORMAL HIGH (ref 80.0–99.0)
MPV: 9.7 FL (ref 8.9–12.9)
MPV: 9.7 FL (ref 8.9–12.9)
Monocytes %: 10.1 % (ref 5.0–13.0)
Monocytes %: 7.7 % (ref 5.0–13.0)
Monocytes Absolute: 0.61 K/UL (ref 0.00–1.00)
Monocytes Absolute: 0.75 K/UL (ref 0.00–1.00)
Neutrophils %: 59.3 % (ref 32.0–75.0)
Neutrophils %: 73 % (ref 32.0–75.0)
Neutrophils Absolute: 4.42 K/UL (ref 1.80–8.00)
Neutrophils Absolute: 5.77 K/UL (ref 1.80–8.00)
Nucleated RBCs: 0 /100{WBCs}
Nucleated RBCs: 0 /100{WBCs}
Platelets: 378 K/uL (ref 150–400)
Platelets: 381 K/uL (ref 150–400)
RBC: 2.69 M/uL — ABNORMAL LOW (ref 3.80–5.20)
RBC: 2.76 M/uL — ABNORMAL LOW (ref 3.80–5.20)
RDW: 18 % — ABNORMAL HIGH (ref 11.5–14.5)
RDW: 18.3 % — ABNORMAL HIGH (ref 11.5–14.5)
WBC: 7.5 K/uL (ref 3.6–11.0)
WBC: 7.9 K/uL (ref 3.6–11.0)
nRBC: 0 K/uL (ref 0.00–0.01)
nRBC: 0 K/uL (ref 0.00–0.01)

## 2024-09-11 LAB — COMPREHENSIVE METABOLIC PANEL
ALT: 24 U/L (ref 10–35)
ALT: 24 U/L (ref 10–35)
AST: 44 U/L — ABNORMAL HIGH (ref 10–35)
AST: 34 U/L (ref 10–35)
Albumin/Globulin Ratio: 0.6 — ABNORMAL LOW (ref 1.1–2.2)
Albumin/Globulin Ratio: 0.6 — ABNORMAL LOW (ref 1.1–2.2)
Albumin: 2.4 g/dL — ABNORMAL LOW (ref 3.5–5.2)
Albumin: 2.3 g/dL — ABNORMAL LOW (ref 3.5–5.2)
Alk Phosphatase: 241 U/L — ABNORMAL HIGH (ref 35–104)
Alk Phosphatase: 213 U/L — ABNORMAL HIGH (ref 35–104)
Anion Gap: 13 mmol/L (ref 2–14)
Anion Gap: 13 mmol/L (ref 2–14)
BUN/Creatinine Ratio: 8 — ABNORMAL LOW (ref 12–20)
BUN/Creatinine Ratio: 9 — ABNORMAL LOW (ref 12–20)
BUN: 36 mg/dL — ABNORMAL HIGH (ref 6–20)
BUN: 39 mg/dL — ABNORMAL HIGH (ref 6–20)
CO2: 21 mmol/L (ref 20–29)
CO2: 19 mmol/L — ABNORMAL LOW (ref 20–29)
Calcium: 7.9 mg/dL — ABNORMAL LOW (ref 8.6–10.0)
Calcium: 8 mg/dL — ABNORMAL LOW (ref 8.6–10.0)
Chloride: 97 mmol/L — ABNORMAL LOW (ref 98–107)
Chloride: 98 mmol/L (ref 98–107)
Creatinine: 4.64 mg/dL — ABNORMAL HIGH (ref 0.60–1.00)
Creatinine: 4.59 mg/dL — ABNORMAL HIGH (ref 0.60–1.00)
Est, Glom Filt Rate: 11 ml/min/1.73m2 — ABNORMAL LOW (ref >59)
Est, Glom Filt Rate: 12 ml/min/1.73m2 — ABNORMAL LOW (ref >59)
Globulin: 3.7 g/dL (ref 2.0–4.0)
Globulin: 3.5 g/dL (ref 2.0–4.0)
Glucose: 356 mg/dL — ABNORMAL HIGH (ref 65–100)
Glucose: 173 mg/dL — ABNORMAL HIGH (ref 65–100)
Potassium: 4.6 mmol/L (ref 3.5–5.1)
Potassium: 4.4 mmol/L (ref 3.5–5.1)
Sodium: 131 mmol/L — ABNORMAL LOW (ref 136–145)
Sodium: 130 mmol/L — ABNORMAL LOW (ref 136–145)
Total Bilirubin: 0.3 mg/dL (ref 0.0–1.2)
Total Bilirubin: 0.2 mg/dL (ref 0.0–1.2)
Total Protein: 6.1 g/dL — ABNORMAL LOW (ref 6.4–8.3)
Total Protein: 5.8 g/dL — ABNORMAL LOW (ref 6.4–8.3)

## 2024-09-11 LAB — TROPONIN
Troponin T: 119 ng/L (ref 0–14)
Troponin T: 112 ng/L (ref 0–14)
Troponin T: 112 ng/L (ref 0–14)
Troponin T: 107 ng/L (ref 0–14)

## 2024-09-11 LAB — POCT GLUCOSE
POC Glucose: 266 mg/dL — ABNORMAL HIGH (ref 65–117)
POC Glucose: 157 mg/dL — ABNORMAL HIGH (ref 65–117)
POC Glucose: 165 mg/dL — ABNORMAL HIGH (ref 65–117)
POC Glucose: 158 mg/dL — ABNORMAL HIGH (ref 65–117)

## 2024-09-11 LAB — HEMOGLOBIN A1C
Estimated Avg Glucose: 180 mg/dL
Hemoglobin A1C: 7.9 % — ABNORMAL HIGH (ref 4.0–5.6)

## 2024-09-11 LAB — MAGNESIUM: Magnesium: 1.9 mg/dL (ref 1.6–2.6)

## 2024-09-11 LAB — EXTRA TUBES HOLD

## 2024-09-11 LAB — LIPASE: Lipase: 44 U/L (ref 13–60)

## 2024-09-11 MED ORDER — ACETAMINOPHEN 325 MG PO TABS
325 | Freq: Four times a day (QID) | ORAL | Status: DC | PRN
Start: 2024-09-11 — End: 2024-09-12

## 2024-09-11 MED ORDER — NORMAL SALINE FLUSH 0.9 % IV SOLN
0.9 | Freq: Two times a day (BID) | INTRAVENOUS | Status: DC
Start: 2024-09-11 — End: 2024-09-12
  Administered 2024-09-11 (×2): 10 mL via INTRAVENOUS

## 2024-09-11 MED ORDER — HYDRALAZINE HCL 20 MG/ML IJ SOLN
20 | Freq: Four times a day (QID) | INTRAMUSCULAR | Status: DC
Start: 2024-09-11 — End: 2024-09-10

## 2024-09-11 MED ORDER — ONDANSETRON 4 MG PO TBDP
4 | Freq: Three times a day (TID) | ORAL | Status: DC | PRN
Start: 2024-09-11 — End: 2024-09-10

## 2024-09-11 MED ORDER — DIAZEPAM 5 MG/ML IJ SOLN
5 | Freq: Three times a day (TID) | INTRAMUSCULAR | Status: AC | PRN
Start: 2024-09-11 — End: 2024-09-12
  Administered 2024-09-11 – 2024-09-12 (×2): 2.5 mg via INTRAVENOUS

## 2024-09-11 MED ORDER — ASPIRIN 81 MG PO CHEW
81 | Freq: Every day | ORAL | Status: DC
Start: 2024-09-11 — End: 2024-09-12
  Administered 2024-09-11 – 2024-09-12 (×2): 81 mg via JEJUNOSTOMY

## 2024-09-11 MED ORDER — DULOXETINE HCL 30 MG PO CPEP
30 | Freq: Every day | ORAL | Status: DC
Start: 2024-09-11 — End: 2024-09-10

## 2024-09-11 MED ORDER — SODIUM CHLORIDE 0.9 % IV SOLN
0.9 | INTRAVENOUS | Status: DC | PRN
Start: 2024-09-11 — End: 2024-09-12

## 2024-09-11 MED ORDER — INSULIN LISPRO 100 UNIT/ML IJ SOLN
100 | Freq: Four times a day (QID) | INTRAMUSCULAR | Status: DC
Start: 2024-09-11 — End: 2024-09-12
  Administered 2024-09-11: 04:00:00 2 [IU] via SUBCUTANEOUS

## 2024-09-11 MED ORDER — SODIUM CHLORIDE (PF) 0.9 % IJ SOLN
0.9 | Freq: Every day | INTRAMUSCULAR | Status: DC
Start: 2024-09-11 — End: 2024-09-12
  Administered 2024-09-11 – 2024-09-12 (×3): 40 mg via INTRAVENOUS

## 2024-09-11 MED ORDER — ACETAMINOPHEN 650 MG RE SUPP
650 | Freq: Four times a day (QID) | RECTAL | Status: DC | PRN
Start: 2024-09-11 — End: 2024-09-12

## 2024-09-11 MED ORDER — HYDRALAZINE HCL 20 MG/ML IJ SOLN
20 | Freq: Four times a day (QID) | INTRAMUSCULAR | Status: DC
Start: 2024-09-11 — End: 2024-09-12
  Administered 2024-09-11 – 2024-09-12 (×5): 10 mg via INTRAVENOUS

## 2024-09-11 MED ORDER — INSULIN GLARGINE 100 UNIT/ML SC SOLN
100 | Freq: Every evening | SUBCUTANEOUS | Status: DC
Start: 2024-09-11 — End: 2024-09-12
  Administered 2024-09-11 – 2024-09-12 (×2): 10 [IU] via SUBCUTANEOUS

## 2024-09-11 MED ORDER — LORAZEPAM 1 MG PO TABS
1 | ORAL | Status: DC
Start: 2024-09-11 — End: 2024-09-10

## 2024-09-11 MED ORDER — DIAZEPAM 5 MG/ML IJ SOLN
5 | Freq: Once | INTRAMUSCULAR | Status: AC
Start: 2024-09-11 — End: 2024-09-10
  Administered 2024-09-11: 01:00:00 2.5 mg via INTRAVENOUS

## 2024-09-11 MED ORDER — MORPHINE SULFATE (PF) 4 MG/ML IJ SOLN
4 | INTRAMUSCULAR | Status: DC | PRN
Start: 2024-09-11 — End: 2024-09-12
  Administered 2024-09-11: 11:00:00 2 mg via INTRAVENOUS

## 2024-09-11 MED ORDER — ACETAMINOPHEN 325 MG PO TABS
325 | Freq: Four times a day (QID) | ORAL | Status: DC | PRN
Start: 2024-09-11 — End: 2024-09-10

## 2024-09-11 MED ORDER — LORAZEPAM 1 MG PO TABS
1 | Freq: Once | ORAL | Status: DC
Start: 2024-09-11 — End: 2024-09-10

## 2024-09-11 MED ORDER — NICARDIPINE HCL 2.5 MG/ML IV SOLN
2.5 | INTRAVENOUS | Status: DC
Start: 2024-09-11 — End: 2024-09-10

## 2024-09-11 MED ORDER — NORMAL SALINE FLUSH 0.9 % IV SOLN
0.9 | INTRAVENOUS | Status: DC | PRN
Start: 2024-09-11 — End: 2024-09-12

## 2024-09-11 MED ORDER — LABETALOL HCL 5 MG/ML IV SOLN
5 | INTRAVENOUS | Status: AC
Start: 2024-09-11 — End: 2024-09-10
  Administered 2024-09-11: 01:00:00 10 mg via INTRAVENOUS

## 2024-09-11 MED ORDER — DIAZEPAM 5 MG/ML IJ SOLN
5 | Freq: Three times a day (TID) | INTRAMUSCULAR | Status: DC
Start: 2024-09-11 — End: 2024-09-10

## 2024-09-11 MED ORDER — SODIUM CHLORIDE 0.9 % IV SOLN
0.9 | Freq: Once | INTRAVENOUS | Status: DC
Start: 2024-09-11 — End: 2024-09-12

## 2024-09-11 MED ORDER — ONDANSETRON HCL 4 MG/2ML IJ SOLN
4 | Freq: Four times a day (QID) | INTRAMUSCULAR | Status: DC | PRN
Start: 2024-09-11 — End: 2024-09-12
  Administered 2024-09-11: 11:00:00 4 mg via INTRAVENOUS

## 2024-09-11 MED ORDER — SODIUM BICARBONATE 650 MG PO TABS
650 | Freq: Three times a day (TID) | ORAL | Status: DC
Start: 2024-09-11 — End: 2024-09-12
  Administered 2024-09-11 – 2024-09-12 (×4): 650 mg via JEJUNOSTOMY

## 2024-09-11 MED ORDER — CLOPIDOGREL BISULFATE 75 MG PO TABS
75 | Freq: Every day | ORAL | Status: DC
Start: 2024-09-11 — End: 2024-09-12
  Administered 2024-09-11 – 2024-09-12 (×2): 75 mg via JEJUNOSTOMY

## 2024-09-11 MED ORDER — ACETAMINOPHEN 650 MG RE SUPP
650 | Freq: Four times a day (QID) | RECTAL | Status: DC | PRN
Start: 2024-09-11 — End: 2024-09-10

## 2024-09-11 MED ORDER — HYDROMORPHONE HCL PF 1 MG/ML IJ SOLN
1 | INTRAMUSCULAR | Status: AC
Start: 2024-09-11 — End: 2024-09-10
  Administered 2024-09-11: 02:00:00 0.5 mg via INTRAVENOUS

## 2024-09-11 MED ORDER — POLYETHYLENE GLYCOL 3350 17 G PO PACK
17 | Freq: Every day | ORAL | Status: DC | PRN
Start: 2024-09-11 — End: 2024-09-10

## 2024-09-11 MED ORDER — CEFAZOLIN 3000 MG IN NS 100 ML IVPB
Freq: Once | Status: DC
Start: 2024-09-11 — End: 2024-09-11

## 2024-09-11 MED ORDER — STERILE WATER FOR INJECTION (MIXTURES ONLY)
1 | INTRAMUSCULAR | Status: DC
Start: 2024-09-11 — End: 2024-09-11

## 2024-09-11 MED ORDER — NORMAL SALINE FLUSH 0.9 % IV SOLN
0.9 | Freq: Two times a day (BID) | INTRAVENOUS | Status: DC
Start: 2024-09-11 — End: 2024-09-12
  Administered 2024-09-11 – 2024-09-12 (×3): 10 mL via INTRAVENOUS

## 2024-09-11 MED ORDER — METOCLOPRAMIDE HCL 5 MG/ML IJ SOLN
5 | Freq: Four times a day (QID) | INTRAMUSCULAR | Status: DC
Start: 2024-09-11 — End: 2024-09-12
  Administered 2024-09-11 – 2024-09-12 (×6): 5 mg via INTRAVENOUS

## 2024-09-11 MED ORDER — MORPHINE SULFATE (PF) 4 MG/ML IJ SOLN
4 | Freq: Once | INTRAMUSCULAR | Status: AC
Start: 2024-09-11 — End: 2024-09-11
  Administered 2024-09-11: 07:00:00 4 mg via INTRAVENOUS

## 2024-09-11 MED ORDER — EPOETIN ALFA-EPBX 10000 UNIT/ML IJ SOLN
10000 | INTRAMUSCULAR | Status: DC
Start: 2024-09-11 — End: 2024-09-12

## 2024-09-11 MED ORDER — ATORVASTATIN CALCIUM 20 MG PO TABS
20 | Freq: Every evening | ORAL | Status: DC
Start: 2024-09-11 — End: 2024-09-12
  Administered 2024-09-11 – 2024-09-12 (×2): 80 mg via JEJUNOSTOMY

## 2024-09-11 MED ORDER — POLYETHYLENE GLYCOL 3350 17 G PO PACK
17 | Freq: Every day | ORAL | Status: DC
Start: 2024-09-11 — End: 2024-09-12
  Administered 2024-09-11 – 2024-09-12 (×2): 17 g via JEJUNOSTOMY

## 2024-09-11 MED ORDER — METOCLOPRAMIDE HCL 5 MG/ML IJ SOLN
5 | Freq: Four times a day (QID) | INTRAMUSCULAR | Status: DC
Start: 2024-09-11 — End: 2024-09-10

## 2024-09-11 MED ORDER — STERILE WATER FOR INJECTION (MIXTURES ONLY)
1 | INTRAMUSCULAR | Status: DC
Start: 2024-09-11 — End: 2024-09-12

## 2024-09-11 MED ORDER — ONDANSETRON HCL 4 MG/2ML IJ SOLN
4 | Freq: Four times a day (QID) | INTRAMUSCULAR | Status: DC | PRN
Start: 2024-09-11 — End: 2024-09-10

## 2024-09-11 MED ORDER — LABETALOL HCL 5 MG/ML IV SOLN
5 | INTRAVENOUS | Status: AC
Start: 2024-09-11 — End: 2024-09-10
  Administered 2024-09-11: 03:00:00 10 mg via INTRAVENOUS

## 2024-09-11 MED ORDER — TRAMADOL HCL 50 MG PO TABS
50 | Freq: Four times a day (QID) | ORAL | Status: DC | PRN
Start: 2024-09-11 — End: 2024-09-12
  Administered 2024-09-12: 06:00:00 50 mg via ORAL

## 2024-09-11 MED ORDER — ONDANSETRON 4 MG PO TBDP
4 | Freq: Three times a day (TID) | ORAL | Status: DC | PRN
Start: 2024-09-11 — End: 2024-09-12

## 2024-09-11 MED ORDER — CARVEDILOL 12.5 MG PO TABS
12.5 | Freq: Two times a day (BID) | ORAL | Status: DC
Start: 2024-09-11 — End: 2024-09-12
  Administered 2024-09-11 – 2024-09-12 (×3): 25 mg via JEJUNOSTOMY

## 2024-09-11 MED ORDER — ONDANSETRON HCL 4 MG/2ML IJ SOLN
4 | Freq: Once | INTRAMUSCULAR | Status: AC
Start: 2024-09-11 — End: 2024-09-10
  Administered 2024-09-11: 04:00:00 4 mg via INTRAVENOUS

## 2024-09-11 MED ORDER — CLONIDINE 0.2 MG/24HR TD PTWK
0.2 | TRANSDERMAL | Status: DC
Start: 2024-09-11 — End: 2024-09-10

## 2024-09-11 MED ORDER — HYDRALAZINE HCL 25 MG PO TABS
25 | Freq: Three times a day (TID) | ORAL | Status: DC
Start: 2024-09-11 — End: 2024-09-12

## 2024-09-11 MED ORDER — DIAZEPAM 5 MG/ML IJ SOLN
5 | Freq: Four times a day (QID) | INTRAMUSCULAR | Status: DC | PRN
Start: 2024-09-11 — End: 2024-09-10
  Administered 2024-09-11: 04:00:00 2.5 mg via INTRAVENOUS

## 2024-09-11 MED ORDER — BUSPIRONE HCL 5 MG PO TABS
5 | Freq: Two times a day (BID) | ORAL | Status: DC
Start: 2024-09-11 — End: 2024-09-12
  Administered 2024-09-11 – 2024-09-12 (×4): 5 mg via JEJUNOSTOMY

## 2024-09-11 MED ORDER — POLYETHYLENE GLYCOL 3350 17 G PO PACK
17 | Freq: Every day | ORAL | Status: DC | PRN
Start: 2024-09-11 — End: 2024-09-11

## 2024-09-11 MED FILL — ASPIRIN 81 MG PO CHEW: 81 mg | ORAL | Qty: 1 | Fill #0

## 2024-09-11 MED FILL — PANTOPRAZOLE SODIUM 40 MG IV SOLR: 40 mg | INTRAVENOUS | Qty: 40 | Fill #0

## 2024-09-11 MED FILL — SODIUM BICARBONATE 650 MG PO TABS: 650 mg | ORAL | Qty: 1 | Fill #0

## 2024-09-11 MED FILL — METOCLOPRAMIDE HCL 5 MG/ML IJ SOLN: 5 mg/mL | INTRAMUSCULAR | Qty: 2 | Fill #0

## 2024-09-11 MED FILL — HYDROMORPHONE HCL 1 MG/ML IJ SOLN: 1 mg/mL | INTRAMUSCULAR | Qty: 1 | Fill #0

## 2024-09-11 MED FILL — BD POSIFLUSH 0.9 % IV SOLN: 0.9 % | INTRAVENOUS | Qty: 40 | Fill #0

## 2024-09-11 MED FILL — CLOPIDOGREL BISULFATE 75 MG PO TABS: 75 mg | ORAL | Qty: 1 | Fill #0

## 2024-09-11 MED FILL — ONDANSETRON HCL 4 MG/2ML IJ SOLN: 4 MG/2ML | INTRAMUSCULAR | Qty: 2 | Fill #0

## 2024-09-11 MED FILL — LABETALOL HCL 5 MG/ML IV SOLN: 5 mg/mL | INTRAVENOUS | Qty: 4 | Fill #0

## 2024-09-11 MED FILL — BUSPIRONE HCL 5 MG PO TABS: 5 mg | ORAL | Qty: 1 | Fill #0

## 2024-09-11 MED FILL — SODIUM CHLORIDE (PF) 0.9 % IJ SOLN: 0.9 % | INTRAMUSCULAR | Qty: 10 | Fill #0

## 2024-09-11 MED FILL — ATORVASTATIN CALCIUM 20 MG PO TABS: 20 mg | ORAL | Qty: 4 | Fill #0

## 2024-09-11 MED FILL — HYDRALAZINE HCL 20 MG/ML IJ SOLN: 20 mg/mL | INTRAMUSCULAR | Qty: 1 | Fill #0

## 2024-09-11 MED FILL — MORPHINE SULFATE 4 MG/ML IJ SOLN: 4 mg/mL | INTRAMUSCULAR | Qty: 1 | Fill #0

## 2024-09-11 MED FILL — CARVEDILOL 12.5 MG PO TABS: 12.5 mg | ORAL | Qty: 2 | Fill #0

## 2024-09-11 MED FILL — BUSPIRONE HCL 10 MG PO TABS: 10 mg | ORAL | Qty: 1 | Fill #0

## 2024-09-11 MED FILL — LANTUS 100 UNIT/ML SC SOLN: 100 [IU]/mL | SUBCUTANEOUS | Qty: 10 | Fill #0

## 2024-09-11 MED FILL — CLONIDINE 0.2 MG/24HR TD PTWK: 0.2 mg/(24.h) | TRANSDERMAL | Qty: 1 | Fill #0

## 2024-09-11 MED FILL — DIAZEPAM 5 MG/ML IJ SOLN: 5 mg/mL | INTRAMUSCULAR | Qty: 2 | Fill #0

## 2024-09-11 MED FILL — INSULIN LISPRO 100 UNIT/ML IJ SOLN: 100 [IU]/mL | INTRAMUSCULAR | Qty: 2 | Fill #0

## 2024-09-11 MED FILL — POLYETHYLENE GLYCOL 3350 17 G PO PACK: 17 g | ORAL | Qty: 1 | Fill #0

## 2024-09-11 NOTE — Consults (Signed)
 "Comprehensive Nutrition Assessment    Type and Reason for Visit: Initial, Consult    Nutrition Recommendations/Plan:     Advance to 3 Carb Choice diet as pt tolerates    Nocturnal Cyclic TF rec's:          - Nepro at 40 ml/hr x 12 hours (8pm - 8am)          - Flush with 30 ml h20 q3h while TF running       Malnutrition Assessment:  Malnutrition Status:  No malnutrition (09/11/24 1421)    Context:  Chronic Illness     Findings of the 6 clinical characteristics of malnutrition:  Energy Intake:  No decrease in energy intake  Weight Loss:  No weight loss     Body Fat Loss:  No body fat loss     Muscle Mass Loss:  No muscle mass loss    Fluid Accumulation:  No fluid accumulation    Grip Strength:  Not Performed     Nutrition Assessment:    43 yo female admitted for pain.  Pmhx: ESRD on HD, DM, gastroparesis, uncontrolled HTN, G-J tube.    MD consult received for TF rec's.  Familiar with pt from recent hospitalization.  Spoke with pt at bedside using interpreter.  Pt reports eating 3 meals/day at home and giving herself 3 bottles of Nepro TF each night via syringe.  She denies having any n/v since last discharge.  C/o pain this admission but sleeping while entering her room. GI visited with pt right before me and pt asking for pain meds but immediately fell back asleep while I was getting interpreter set up.  GI states they plan to sign off.      Nocturnal cyclic TF rec's: Nepro at 40 ml/hr x 12 hours (8pm-8am) + 30 ml h20 flush q3h while TF running.   This will provide 480 ml, 864 kcals (64% kcal needs), 38 gm protein (58% protein needs), 348 + 120 = 468 ml water       Nutritionally Significant Medications:  Epo, Lantus , Humalog , Reglan , Protonix , Glycolax     Estimated Daily Nutrient Needs:  Energy Requirements Based On: Kcal/kg  Weight Used for Energy Requirements: Current  Energy (kcal/day): 1350-1530 (22-25 kcals/kg)  Weight Used for Protein Requirements: Current  Protein (g/day): 67 (1.1 gm/kg)  Method Used for Fluid  Requirements: 1 ml/kcal  Fluid (ml/day): 1350    Nutrition Related Findings:   Edema: None   Edema Generalized: None              Recent Labs     09/10/24  1857 09/11/24  0239   GLUCOSE 356* 173*   BUN 36* 39*   CREATININE 4.64* 4.59*   NA 131* 130*   K 4.6 4.4   CL 97* 98   CO2 21 19*   CALCIUM  7.9* 8.0*   MG 1.9  --      Recent Labs     09/10/24  2243 09/11/24  0808 09/11/24  1129   POCGLU 266* 157* 165*     Lab Results   Component Value Date/Time    LABA1C 7.9 09/11/2024 02:39 AM    LABA1C 6.1 06/27/2024 05:10 AM    LABA1C 7.0 02/19/2024 05:02 AM    EAG 180 09/11/2024 02:39 AM     Triglycerides   Date Value Ref Range Status   08/14/2024 352 (H) 0 - 150 MG/DL Final     Comment:     Borderline High: 150-199 mg/dL,  High: 200-499 mg/dL  Very High: Greater than or equal to 500 mg/dL     89/71/7974 828 (H) 0 - 150 MG/DL Final     Comment:     Borderline High: 150-199 mg/dL, High: 799-500 mg/dL  Very High: Greater than or equal to 500 mg/dL     98/98/7974 844 (H) <150 MG/DL Final     Comment:     Based on NCEP-ATP III:  Triglycerides <150 mg/dL  is considered normal, 150-199 mg/dL  borderline high,  799-500 mg/dL high and  greater than or equal to 500 mg/dL very high.       Last BM: 09/10/24    Wounds: Wound Type: None    Current Nutrition Therapies:  Diet: Full liquid, 4 Carb Choice, Low K+, Low Phos  Supplements: none  Meal Intake:   No data found.  Supplement Intake:  No data found.  Nutrition Support: none    Anthropometric Measures:  Height: 149.9 cm (4' 11.02)  Ideal Body Weight (IBW): 95 lbs (43 kg)       Current Body Weight: 61.2 kg (134 lb 14.7 oz), 142 % IBW. Weight Source: Bed scale  Current BMI (kg/m2): 27.2        Weight Adjustment For: No Adjustment                 BMI Categories: Overweight (BMI 25.0-29.9)    Wt Readings from Last 10 Encounters:   09/11/24 61.2 kg (134 lb 14.7 oz)   08/06/24 54.6 kg (120 lb 5.9 oz)   07/20/24 57.2 kg (126 lb 1.7 oz)   07/11/24 54.6 kg (120 lb 5.9 oz)   07/09/24 55.5 kg  (122 lb 5.7 oz)   03/06/24 57.6 kg (127 lb)   02/21/24 58 kg (127 lb 13.9 oz)   11/28/23 52.2 kg (115 lb 1.3 oz)   10/31/23 52.6 kg (115 lb 15.4 oz)   10/21/23 55.2 kg (121 lb 11.1 oz)       Nutrition Diagnosis:   Altered nutrition-related lab values related to endocrine dysfunction as evidenced by lab values (HgbA1c 7.9)    Nutrition Interventions:   Food and/or Nutrient Delivery: Continue Current Diet, Start Tube Feeding  Nutrition Education/Counseling: No recommendation at this time  Coordination of Nutrition Care: Continue to monitor while inpatient       Goals:     Goals: PO intake 50% or greater, by next RD assessment, Tolerate nutrition support at goal rate       Nutrition Monitoring and Evaluation:   Behavioral-Environmental Outcomes: None Identified  Food/Nutrient Intake Outcomes: Food and Nutrient Intake, Diet Advancement/Tolerance, Supplement Intake, Enteral Nutrition Intake/Tolerance  Physical Signs/Symptoms Outcomes: Biochemical Data, GI Status, Nausea or Vomiting, Weight    Discharge Planning:    Enteral Nutrition     Adella Acron, RD  Available via PerfectServe    "

## 2024-09-11 NOTE — Progress Notes (Signed)
"  RN notified by GI that pt was complaining of 10/10 pain. Upon GI's arrival to pts room, pt resting comfortably. GI expressed some concern regarding pain medication administration.     RN went to bedside to assess pt. Pt resting comfortably, chest rise and fall noted. Breathing unlabored.     1412: RN notified MD regarding previous events. Per MD, hold pain medication at this time.   "

## 2024-09-11 NOTE — Progress Notes (Signed)
"  Spiritual Health Attempted Visit Note  [place]      Room # B310/01    Name: Melinda [name]           Age: 43 y.o.    Gender: female          MRN: [id number]  Religion: Catholic       Preferred Language: Spanish      Date: 09/11/24  Visit Time: Begin Time: 1230 End Time : 1235 Complexity of Encounter: Low      Visit Summary: Chaplain attempted to meet with patient/ in response to Catholic rounds. Patient was asleep and no family present.  His patient has been visited during past admissions by this chaplain. Prayer for spiritual communion offered outside her room. Chaplain available for follow-up as needed.      Patient was not available. Patient was working with other staff/was asleep/ indisposed. Chaplain will follow-up at a later time.        Electronically signed by Elia Karolee Buel Charlott, SBS, RN, ACSW, LCSW  Chaplain Page:  [phone])  "

## 2024-09-11 NOTE — Care Coordination (Addendum)
 "    Care Management Initial Assessment  09/11/2024 1:40 PM  If patient is discharged prior to next notation, then this note serves as note for discharge by case management.    Reason for Admission:   Abdominal pain [R10.9]  Generalized abdominal pain [R10.84]  Hypertensive urgency [I16.0]  Abdominal pain, unspecified abdominal location [R10.9]         Patient Admission Status: Inpatient  Date Admitted to INP: 09/10/24  RUR: Readmission Risk Score: 46.1    Hospitalization in the last 30 days (Readmission):  Yes      Readmission Assessment  Who is being Interviewed: Patient  Number of Days since last admission?: 8-30 days  Previous Disposition: Home with Family  What was the patient's/caregiver's perception as to why they think they needed to return back to the hospital?: Worsening of symptoms/Unexpected complications  Did you visit your Primary Care Physician after you left the hospital, before you returned this time?: No  Why weren't you able to visit your PCP?: Did not have an appointment  Did you see a specialist, such as Cardiac, Pulmonary, Orthopedic Physician, etc. after you left the hospital?: No  Who advised the patient to return to the hospital?: Self-referral  Does the patient report anything that got in the way of taking their medications?: No  What could we have done to help prevent your return to the hospital?: Other (Comment) (abd pain)       Advance Care Planning:  Code Status: Full Code  Primary Healthcare Decision Maker: (P) Legal Next of Kin  Primary Decision Maker: Pearly Jonette JASMINE Child [phone]   Advance Directive: has NO advanced directive - not interested in additional information     __________________________________________________________________________  Assessment:      09/11/24 1332   Service Assessment   Information Provided By Patient  (used interpreter Ronal [phone])   Patient Orientation Alert   Cognition No Apparent Deficit   Primary Caregiver Self   Support Systems Family  Members   Patient's Healthcare Decision Maker is: Legal Next of Kin   PCP Verified by CM Yes   Last Visit to PCP Within last 6 months  (uses free clinic in [place])   Prior Functional Level Independent in ADLs/IADLs   Current Functional Level at Time of Initial Assessment Independent in ADLs/IADLs   Can patient return to prior living arrangement Yes   Ability to make needs known: Good   Family able to assist with home care needs: Yes   Receives Help From Family   Social/Functional History   Prior Level of Assist for ADLs Independent   Prior Level of Assist for Transfers Independent   Ambulation Assistance Independent   Active Driver No   Patient's Driver Info family   Mode of Economist    Location Prior to Acute Admission House   Lives With Family   Current Services Prior To Admission Specialty Clinics   Dialysis Type Hemodialysis  ([facility])   Dialysis Days M/W/F   Transportation (free text) family   Patient expects to be discharged to: Home   Services At/After Discharge   Who will provide transportation at discharge? Family     Comments: Patient was recently dc on 11/18, returned d/t increasing abdominal pain. Patient reports independence at baseline, lives with family, uses no DME. Patient follows with the free clinic in [place] and receives dialysis MWF @ 1:30 with [facility], family transports. Patient uses [facility] in [place], family will transport  home at discharge.     Discharge Concerns: [] Yes [x] No [] Unknown   Describe:    Financial concerns/barriers: [] Yes, explain: [] No [x] Unknown/Not discussed  __________________________________________________________________________    Insurer:   Active Insurance as of 09/10/2024       Primary Coverage       Payor Plan Insurance Group Employer/Plan Group    [employer] H[employer] [id number]       Payor Plan Address Payor Plan Phone Number Payor Plan Fax Number Effective Dates    [address] [phone]  07/11/2024 -  None Entered    [place] [id number]         Subscriber Name Subscriber Birth Date Member ID       MYKENZI, VANZILE May 05, 1981 [id number]                     PCP: No primary care provider on file.   Address: No primary physician on file.   Phone number: None    Pharmacy:   [facility] [address], [place] - [address] [place] [phone] [address] [place] [phone]  [address]  [place] [address]  Phone: [phone] Fax: [phone]    DC Transport: (P) Family       Transition of care plan:    [] Unable to determine at this time. Awaiting clinical progress, and disposition recommendations.    []  Home. No assistance required.     []  Home. Pt refused recommended services.    [x]  Home with family assistance as needed, and outpatient follow-up.    []  Home with Outpatient PT and outpatient follow-up   Pt aware of OP appt? [] Yes, Provider:   [] Not scheduled   Transport provider:     []  Home with outpatient services.    Specify:    []  Home with Home Health   - Freedom of Choice offered? []  Yes, Preference:   []  NA    [] SNF/IPR   -[] Freedom of Choice offered, and preferences given:   [] Listing provided and preferences requested   -Status: [] Pending [] Accepted:    -Auth required: [] Yes [] No    -Auth initiated date:   -3 midnight stay required: [] Yes [] No  Date satisfied:     []  LTC:     []  Home with Hospice   - Freedom of Choice offered? []  Yes, Preference:   []  NA    []  Dispatch Health information provided.     []  Other:       Ronal Greet, RN  Case Management Department  For questions or concerns, please PerfectServe       "

## 2024-09-11 NOTE — Progress Notes (Signed)
"  Spiritual Health Attempted Visit Note  [place]      Room # B310/01    Name: Meira Wahba           Age: 43 y.o.    Gender: female          MRN: [id number]  Religion: Catholic       Preferred Language: Spanish      Date: 09/11/24  Visit Time: Begin Time: 1535 End Time : 1545 Complexity of Encounter: Low      Visit Summary:     Chart review. Attempted encounter. Patient sleeping. Patient unavailable. Unable to assess patient. Shared availability with nurse. Contact Chaplain for further referrals.     Patient was not available. Please contact spiritual health services for further assistance needed.      Electronically signed by      Selinda Adine Lemming, [facility]  Staff Chaplain   Spiritual Health Services  Paging Service [phone] (PRAY)   "

## 2024-09-11 NOTE — Consults (Signed)
 Session ID: [id number]  Session Duration: 6 minutes  Language: Spanish  Interpreter ID: #[id number]  Interpreter Name: Camila

## 2024-09-11 NOTE — Consults (Signed)
 Session ID: [id number]  Session Duration: 6 minutes  Language: Spanish  Interpreter ID: #[id number]  Interpreter Name: [facility]

## 2024-09-11 NOTE — Consults (Signed)
 "                                                                          Vernell Arrow, PA-C                       [phone] office             Monday-Friday 8:00 am-4:30 pm  I am not permitted to use perfect serve use above for contact, thanks.      Gastroenterology Consultation Note      Admit Date: 09/10/2024  Consult Date: 09/11/2024   I greatly appreciate your asking me to see Melinda [name], thank you very much for the opportunity to participate in her care.    Narrative Assessment and Plan       Impression:  well known to our service with chronic GI complaints including n/v, abdominal pain and GJ tube with known gastroparesis. She has difficulty obtaining medications which influences her chronic conditions, leading to admissions. She is not on a daily bowel regimen and has been constipated on prior imaging. Would maximize reglan , 10mg  TID, consider scopalamine patch if able and consider scheduled zofran , or compazine  if QT interval acceptable.  Consider Jtube feeds as tolerated and Po diet as tolerated, to start with clears.  Would strongly avoid any anticholinergics and narcotics.  Give miralax  daily via Jtube.  Inpt GI to sign off.      Vernell Jacques Arrow, PA-C    Subjective:     Chief Complaint:     History of Present Illness: 43 year old female well-known to GI with known gastroparesis s/p GJ tube though takes pO diet intermittently and has difficulty obtaining gastroparesis medications etc.  She returns with HTN  urgency/emergency and BP in the 200/90 range. She c/o diffuse pain, anxiety and insomnia.  She gets HD MWF.  Renal following.   Her CT a/p on admission shows GJ tube in satisfactory position.  During prior admission, she has refused Tfs at times.  UDS intermittently positive for THC, tox screen pending.  Previous labs have showed elevated Alkaline phosphatase  with elevated GGT.  Underwent MRCP with no biliary dilation.  Already s/p cholecystectomy 2024.  Last EGD December  2024 was unremarkable, biopsies revealed chronic inactive gastritis with atrophy, H. pylori negative. She has been advised to take Bid PPI, reglan  5mg  TID, Tfs as tolerated and avoid any narcotics or anticholinergic medication that may slow gut motlity.   She has prn miralax  ordered and scheduled reglan . Her BP has improved. She is nPO.  Upon my visit she is sleeping. Awakens easily to voice and immediately requests pain medication.  Intepretor was used to discuss with pt. She reports intermittent use of tube feeds at home, no constipation and denies vomiting.  However, nurse states she did vomit this a.m.     Of note, she had positive blood cx last admission and is getting ancef  during HD.      PCP:  No primary care provider on file.    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)  Gastroparesis     Gastroparesis     GERD (gastroesophageal reflux disease)     High cholesterol     Hypertension         Past Surgical History:   Procedure Laterality Date    CAPSULE ENDOSCOPY N/A 01/20/2023    ESOPHAGEAL CAPSULE ENDOSCOPY remove at 1624PM performed by Madlyn Fendt, MD at [facility] ENDOSCOPY    CHOLECYSTECTOMY, LAPAROSCOPIC N/A 02/03/2023    ROBOTIC LAPAROSCOPIC CHOLECYSTECTOMY with Indocyanine green  performed by Chrystal Elijah BRAVO, MD at [facility] MAIN OR    COLONOSCOPY N/A 01/19/2023    COLONOSCOPY DIAGNOSTIC performed by Madlyn Fendt, MD at [facility] ENDOSCOPY    INVASIVE VASCULAR N/A 10/03/2023    Angiography visceral SMA performed by Millard Oneil LABOR, MD at [facility] CARDIAC CATH LAB    INVASIVE VASCULAR N/A 10/03/2023    Ultrasound guided vascular access performed by Millard Oneil LABOR, MD at [facility] CARDIAC CATH LAB    INVASIVE VASCULAR N/A 10/03/2023    Insert stent peripheral artery performed by Millard Oneil LABOR, MD at [facility] CARDIAC CATH LAB    IR NONTUNNELED VASCULAR CATHETER > 5 YEARS  10/10/2023    IR NONTUNNELED VASCULAR CATHETER > 5 YEARS 10/10/2023 [facility] CARDIAC  CATH/EP/IR LAB    IR NONTUNNELED VASCULAR CATHETER > 5 YEARS  10/10/2023    IR NONTUNNELED VASCULAR CATHETER > 5 YEARS 10/10/2023 [facility] CARDIAC CATH/EP/IR LAB    OTHER SURGICAL HISTORY Left     Rentia attachment    TUBAL LIGATION Bilateral     UPPER GASTROINTESTINAL ENDOSCOPY N/A 01/17/2023    ESOPHAGOGASTRODUODENOSCOPY performed by Madlyn Fendt, MD at [facility] ENDOSCOPY    UPPER GASTROINTESTINAL ENDOSCOPY N/A 01/17/2023    ESOPHAGOGASTRODUODENOSCOPY BIOPSY performed by Madlyn Fendt, MD at [facility] ENDOSCOPY    UPPER GASTROINTESTINAL ENDOSCOPY N/A 01/18/2023    ESOPHAGOGASTRODUODENOSCOPY performed by Madlyn Fendt, MD at [facility] ENDOSCOPY    UPPER GASTROINTESTINAL ENDOSCOPY N/A 05/13/2023    ESOPHAGOGASTRODUODENOSCOPY performed by Roseann Lonni BIRCH, MD at [facility] ENDOSCOPY    UPPER GASTROINTESTINAL ENDOSCOPY N/A 05/13/2023    ESOPHAGOGASTRODUODENOSCOPY BIOPSY performed by Roseann Lonni BIRCH, MD at [facility] ENDOSCOPY    UPPER GASTROINTESTINAL ENDOSCOPY N/A 09/21/2023    ESOPHAGOGASTRODUODENOSCOPY performed by Madlyn Fendt, MD at [facility] ENDOSCOPY    UPPER GASTROINTESTINAL ENDOSCOPY N/A 09/21/2023    ESOPHAGOGASTRODUODENOSCOPY BIOPSY performed by Madlyn Fendt, MD at [facility] ENDOSCOPY    US  FLUID COLLECTION DRAINAGE PERITONEAL/RETROPERITONEAL [facility]  02/11/2023    US  ABSCESS DRAINAGE PERITONEAL 02/11/2023 [facility] US        Social History     Tobacco Use    Smoking status: Never    Smokeless tobacco: Never   Substance Use Topics    Alcohol use: Never        No family history on file.     No Known Allergies         Home Medications:  Prior to Admission Medications   Prescriptions Last Dose Informant Patient Reported? Taking?   DULoxetine  (CYMBALTA ) 60 MG extended release capsule 09/10/2024  No Yes   Sig: 1 capsule by Per G Tube route daily   aspirin  81 MG chewable tablet 09/10/2024  No Yes   Sig: 1 tablet by Per J Tube route daily   atorvastatin  (LIPITOR ) 80 MG tablet 09/10/2024  No Yes   Sig: 1 tablet by Per J Tube route nightly   busPIRone  (BUSPAR ) 5 MG tablet  09/10/2024  No Yes   Sig: 1 tablet by Per J Tube route in the morning and  at bedtime   carvedilol  (COREG ) 25 MG tablet 09/10/2024  No Yes   Sig: 1 tablet by Per J Tube route 2 times daily (with meals)   ceFAZolin  (ANCEF ) 500 MG injection 09/10/2024  No Yes   Sig: cefazolin  2/2/3 post HD until 09/19/24   clopidogrel  (PLAVIX ) 75 MG tablet 09/10/2024  No Yes   Sig: 1 tablet by Per J Tube route daily   dicyclomine  (BENTYL ) 10 MG capsule 09/10/2024  No Yes   Sig: 1 capsule by Per J Tube route 3 times daily (before meals)   famotidine  (PEPCID ) 20 MG tablet 09/10/2024  No Yes   Sig: 1 tablet by Per J Tube route daily   hydrALAZINE  (APRESOLINE ) 50 MG tablet 09/10/2024  No Yes   Sig: 1 tablet by Per J Tube route 3 times daily   insulin  glargine (LANTUS ) 100 UNIT/ML injection vial 09/10/2024  No Yes   Sig: Inject 10 Units into the skin nightly   lansoprazole  (PREVACID  SOLUTAB) 30 MG disintegrating tablet 09/10/2024  No Yes   Sig: 1 tablet by Per J Tube route 2 times daily   metoclopramide  (REGLAN ) 5 MG/5ML solution 09/10/2024  No Yes   Sig: 5 mLs by Per J Tube route 3 times daily (before meals)   sennosides-docusate sodium  (SENOKOT-S) 8.6-50 MG tablet Not Taking  No No   Sig: 1 tablet by Per J Tube route in the morning and at bedtime   Patient not taking: Reported on 09/11/2024   sodium bicarbonate  650 MG tablet 09/10/2024  No Yes   Sig: 1 tablet by Per J Tube route 3 times daily      Facility-Administered Medications: None       Hospital Medications:  Current Facility-Administered Medications   Medication Dose Route Frequency    traMADol  (ULTRAM ) tablet 50 mg  50 mg Oral Q6H PRN    morphine  sulfate (PF) injection 2 mg  2 mg IntraVENous Q4H PRN    [START ON 09/12/2024] ceFAZolin  (ANCEF ) 2,000 mg in sterile water  20 mL IV syringe  2,000 mg IntraVENous Once per day on Monday Wednesday    [START ON 09/14/2024] ceFAZolin  (ANCEF ) 3,000 mg in sodium chloride  0.9 % 100 mL IVPB (Vial2Bag)  3,000 mg IntraVENous Once    [START ON 09/12/2024] epoetin   alfa-epbx (RETACRIT ) injection 10,000 Units  10,000 Units SubCUTAneous Once per day on Monday Wednesday Friday    sodium chloride  flush 0.9 % injection 5-40 mL  5-40 mL IntraVENous 2 times per day    sodium chloride  flush 0.9 % injection 5-40 mL  5-40 mL IntraVENous PRN    0.9 % sodium chloride  infusion   IntraVENous PRN    ondansetron  (ZOFRAN -ODT) disintegrating tablet 4 mg  4 mg Oral Q8H PRN    Or    ondansetron  (ZOFRAN ) injection 4 mg  4 mg IntraVENous Q6H PRN    acetaminophen  (TYLENOL ) tablet 650 mg  650 mg Oral Q6H PRN    Or    acetaminophen  (TYLENOL ) suppository 650 mg  650 mg Rectal Q6H PRN    sodium chloride  flush 0.9 % injection 5-40 mL  5-40 mL IntraVENous 2 times per day    sodium chloride  flush 0.9 % injection 5-40 mL  5-40 mL IntraVENous PRN    0.9 % sodium chloride  infusion   IntraVENous PRN    polyethylene glycol (GLYCOLAX ) packet 17 g  17 g Oral Daily PRN    insulin  glargine (LANTUS ) injection vial 10 Units  10 Units SubCUTAneous Nightly  aspirin  chewable tablet 81 mg  81 mg Per J Tube Daily    atorvastatin  (LIPITOR ) tablet 80 mg  80 mg Per J Tube QHS    busPIRone  (BUSPAR ) tablet 5 mg  5 mg Per J Tube BID    carvedilol  (COREG ) tablet 25 mg  25 mg Per J Tube BID WC    clopidogrel  (PLAVIX ) tablet 75 mg  75 mg Per J Tube Daily    [Held by provider] hydrALAZINE  (APRESOLINE ) tablet 50 mg  50 mg Per J Tube TID    sodium bicarbonate  tablet 650 mg  650 mg Per J Tube TID    insulin  lispro (HUMALOG ,ADMELOG ) injection vial 0-4 Units  0-4 Units SubCUTAneous 4x Daily AC & HS    pantoprazole  (PROTONIX ) 40 mg in sodium chloride  (PF) 0.9 % 10 mL injection  40 mg IntraVENous Daily    metoclopramide  (REGLAN ) injection 5 mg  5 mg IntraVENous Q6H    hydrALAZINE  (APRESOLINE ) injection 10 mg  10 mg IntraVENous Q6H    diazePAM  (VALIUM ) injection 2.5 mg  2.5 mg IntraVENous Q8H PRN       Review of Systems:   Review of Systems  limited due to pts sedation      Objective:     Physical Exam:  Vitals:    09/11/24 1038   BP:  (!) 118/58   Pulse: 71   Resp: 16   Temp: 97.9 F (36.6 C)   SpO2: 98%     SpO2 Readings from Last 6 Encounters:   09/11/24 98%   08/28/24 99%   07/20/24 94%   07/11/24 98%   07/09/24 98%   07/05/24 95%          Intake/Output Summary (Last 24 hours) at 09/11/2024 1259  Last data filed at 09/11/2024 9078  Gross per 24 hour   Intake 45 ml   Output --   Net 45 ml        General: alert, well appearing, and no distress, resting on my visit   Head: Normocephalic, without obvious abnormality, atraumatic  Eyes: anicteric sclerae and conjuntiva clear  Lungs: clear to auscultation with good breath sounds and normal respiratory effort  Heart: RRR  Abd: not distended, soft, nontender, BS present and normactive  Ext: no cyanosis and no edema  Skin: normal skin color, no rashes, and texture normal  Neuro:  Alert, oriented x 4, Nonfocal exam, and Cranial nerves 3-12 grossly intact  Psych: not anxious, cooperative, appropriate affect       Laboratory:    Recent Results (from the past 24 hours)   EKG 12 Lead    Collection Time: 09/10/24  6:33 PM   Result Value Ref Range    Ventricular Rate 89 BPM    Atrial Rate 89 BPM    P-R Interval 186 ms    QRS Duration 86 ms    Q-T Interval 418 ms    QTc Calculation (Bazett) 508 ms    P Axis 22 degrees    R Axis -81 degrees    T Axis -45 degrees    Diagnosis       Normal sinus rhythm  Left axis deviation  Nonspecific T wave abnormality  Prolonged QT  Abnormal ECG  When compared with ECG of 24-Aug-2024 20:45,  Nonspecific T wave abnormality now evident in Inferior leads  Nonspecific T wave abnormality, worse in Anterolateral leads     CBC with Auto Differential    Collection Time: 09/10/24  6:57 PM  Result Value Ref Range    WBC 7.9 3.6 - 11.0 K/uL    RBC 2.76 (L) 3.80 - 5.20 M/uL    Hemoglobin 8.5 (L) 11.5 - 16.0 g/dL    Hematocrit 72.3 (L) 35.0 - 47.0 %    MCV 100.0 (H) 80.0 - 99.0 FL    MCH 30.8 26.0 - 34.0 PG    MCHC 30.8 30.0 - 36.5 g/dL    RDW 81.6 (H) 88.4 - 14.5 %    Platelets 381 150 -  400 K/uL    MPV 9.7 8.9 - 12.9 FL    Nucleated RBCs 0.0 0 PER 100 WBC    nRBC 0.00 0.00 - 0.01 K/uL    Neutrophils % 73.0 32.0 - 75.0 %    Lymphocytes % 11.3 (L) 12.0 - 49.0 %    Monocytes % 7.7 5.0 - 13.0 %    Eosinophils % 7.1 (H) 0.0 - 7.0 %    Basophils % 0.5 0.0 - 1.0 %    Immature Granulocytes % 0.4 0.0 - 0.5 %    Neutrophils Absolute 5.77 1.80 - 8.00 K/UL    Lymphocytes Absolute 0.89 0.80 - 3.50 K/UL    Monocytes Absolute 0.61 0.00 - 1.00 K/UL    Eosinophils Absolute 0.56 (H) 0.00 - 0.40 K/UL    Basophils Absolute 0.04 0.00 - 0.10 K/UL    Immature Granulocytes Absolute 0.03 0.00 - 0.04 K/UL    Differential Type AUTOMATED     Comprehensive Metabolic Panel    Collection Time: 09/10/24  6:57 PM   Result Value Ref Range    Sodium 131 (L) 136 - 145 mmol/L    Potassium 4.6 3.5 - 5.1 mmol/L    Chloride 97 (L) 98 - 107 mmol/L    CO2 21 20 - 29 mmol/L    Anion Gap 13 2 - 14 mmol/L    Glucose 356 (H) 65 - 100 mg/dL    BUN 36 (H) 6 - 20 MG/DL    Creatinine 5.35 (H) 0.60 - 1.00 MG/DL    BUN/Creatinine Ratio 8 (L) 12 - 20      Est, Glom Filt Rate 11 (L) >59 ml/min/1.54m2    Calcium  7.9 (L) 8.6 - 10.0 MG/DL    Total Bilirubin 0.3 0.0 - 1.2 MG/DL    ALT 24 10 - 35 U/L    AST 44 (H) 10 - 35 U/L    Alk Phosphatase 241 (H) 35 - 104 U/L    Total Protein 6.1 (L) 6.4 - 8.3 g/dL    Albumin  2.4 (L) 3.5 - 5.2 g/dL    Globulin 3.7 2.0 - 4.0 g/dL    Albumin /Globulin Ratio 0.6 (L) 1.1 - 2.2     Magnesium     Collection Time: 09/10/24  6:57 PM   Result Value Ref Range    Magnesium  1.9 1.6 - 2.6 mg/dL   Lipase    Collection Time: 09/10/24  6:57 PM   Result Value Ref Range    Lipase 44 13 - 60 U/L   Extra Tubes Hold    Collection Time: 09/10/24  6:57 PM   Result Value Ref Range    Specimen HOld GREEN, SST, RED, BLUE     Comment:        Add-on orders for these samples will be processed based on acceptable specimen integrity and analyte stability, which may vary by analyte.   Troponin    Collection Time: 09/10/24  6:57 PM   Result Value Ref  Range  Troponin T 119.0 (HH) 0 - 14 ng/L   Troponin    Collection Time: 09/10/24  8:55 PM   Result Value Ref Range    Troponin T 112.0 (HH) 0 - 14 ng/L   POCT Glucose    Collection Time: 09/10/24 10:43 PM   Result Value Ref Range    POC Glucose 266 (H) 65 - 117 mg/dL    Performed by: Jenney Maxwell    Troponin    Collection Time: 09/10/24 10:48 PM   Result Value Ref Range    Troponin T 112.0 (HH) 0 - 14 ng/L   Troponin    Collection Time: 09/11/24  2:39 AM   Result Value Ref Range    Troponin T 107.0 (HH) 0 - 14 ng/L   CBC with Auto Differential    Collection Time: 09/11/24  2:39 AM   Result Value Ref Range    WBC 7.5 3.6 - 11.0 K/uL    RBC 2.69 (L) 3.80 - 5.20 M/uL    Hemoglobin 8.3 (L) 11.5 - 16.0 g/dL    Hematocrit 72.6 (L) 35.0 - 47.0 %    MCV 101.5 (H) 80.0 - 99.0 FL    MCH 30.9 26.0 - 34.0 PG    MCHC 30.4 30.0 - 36.5 g/dL    RDW 81.9 (H) 88.4 - 14.5 %    Platelets 378 150 - 400 K/uL    MPV 9.7 8.9 - 12.9 FL    Nucleated RBCs 0.0 0 PER 100 WBC    nRBC 0.00 0.00 - 0.01 K/uL    Neutrophils % 59.3 32.0 - 75.0 %    Lymphocytes % 21.7 12.0 - 49.0 %    Monocytes % 10.1 5.0 - 13.0 %    Eosinophils % 8.1 (H) 0.0 - 7.0 %    Basophils % 0.7 0.0 - 1.0 %    Immature Granulocytes % 0.1 0.0 - 0.5 %    Neutrophils Absolute 4.42 1.80 - 8.00 K/UL    Lymphocytes Absolute 1.62 0.80 - 3.50 K/UL    Monocytes Absolute 0.75 0.00 - 1.00 K/UL    Eosinophils Absolute 0.60 (H) 0.00 - 0.40 K/UL    Basophils Absolute 0.05 0.00 - 0.10 K/UL    Immature Granulocytes Absolute 0.01 0.00 - 0.04 K/UL    Differential Type AUTOMATED     Comprehensive Metabolic Panel    Collection Time: 09/11/24  2:39 AM   Result Value Ref Range    Sodium 130 (L) 136 - 145 mmol/L    Potassium 4.4 3.5 - 5.1 mmol/L    Chloride 98 98 - 107 mmol/L    CO2 19 (L) 20 - 29 mmol/L    Anion Gap 13 2 - 14 mmol/L    Glucose 173 (H) 65 - 100 mg/dL    BUN 39 (H) 6 - 20 MG/DL    Creatinine 5.40 (H) 0.60 - 1.00 MG/DL    BUN/Creatinine Ratio 9 (L) 12 - 20      Est, Glom Filt Rate  12 (L) >59 ml/min/1.91m2    Calcium  8.0 (L) 8.6 - 10.0 MG/DL    Total Bilirubin 0.2 0.0 - 1.2 MG/DL    ALT 24 10 - 35 U/L    AST 34 10 - 35 U/L    Alk Phosphatase 213 (H) 35 - 104 U/L    Total Protein 5.8 (L) 6.4 - 8.3 g/dL    Albumin  2.3 (L) 3.5 - 5.2 g/dL    Globulin 3.5 2.0 - 4.0 g/dL  Albumin /Globulin Ratio 0.6 (L) 1.1 - 2.2     Hemoglobin A1C    Collection Time: 09/11/24  2:39 AM   Result Value Ref Range    Hemoglobin A1C 7.9 (H) 4.0 - 5.6 %    Estimated Avg Glucose 180 mg/dL   POCT Glucose    Collection Time: 09/11/24  8:08 AM   Result Value Ref Range    POC Glucose 157 (H) 65 - 117 mg/dL    Performed by: CHESLEY SEATS (PCT)    Echo (TTE) complete (PRN contrast/bubble/strain/3D)    Collection Time: 09/11/24 11:22 AM   Result Value Ref Range    Body Surface Area 1.6 m2    IVSd 1.1 (A) 0.6 - 0.9 cm    LVIDd 5.0 3.9 - 5.3 cm    LVIDs 3.5 cm    LVOT Diameter 2.0 cm    LVPWd 1.2 (A) 0.6 - 0.9 cm    EF BP 55 55 - 100 %    LV Ejection Fraction A2C 57 %    LV Ejection Fraction A4C 54 %    LV EDV A2C 101 mL    LV EDV A4C 106 mL    LV EDV BP 107 (A) 56 - 104 mL    LV ESV A2C 44 mL    LV ESV A4C 49 mL    LV ESV BP 48 19 - 49 mL    LVOT Peak Gradient 6 mmHg    LVOT Mean Gradient 3 mmHg    LVOT SV 80.1 ml    LVOT Peak Velocity 1.3 m/s    LVOT VTI 25.5 cm    RV Free Wall Peak S' 11.0 cm/s    LA Volume A/L 84 mL    LA Volume A-L A4C 93 (A) 22 - 52 mL    LA Volume A-L A4C 71 (A) 22 - 52 mL    LA Volume MOD A2C 88 (A) 22 - 52 mL    LA Volume MOD A4C 67 (A) 22 - 52 mL    LA Volume BP 80 (A) 22 - 52 mL    AV Area by Peak Velocity 2.5 cm2    AV Area by VTI 2.3 cm2    AV Peak Gradient 10 mmHg    AV Mean Gradient 6 mmHg    AV Peak Velocity 1.6 m/s    AV Mean Velocity 1.2 m/s    AV VTI 35.1 cm    MV A Velocity 0.66 m/s    MV E Wave Deceleration Time 196.3 ms    MV E Velocity 1.05 m/s    LV E' Lateral Velocity 4.09 cm/s    LV E' Septal Velocity 4.46 cm/s    TAPSE 1.8 >=1.7 cm    TR Peak Gradient 29 mmHg    TR Max Velocity 2.69 m/s     Ascending Aorta 2.7 cm    Aortic Root 2.8 cm    Fractional Shortening 2D 30 28 - 44 %    LV ESV Index BP 31 mL/m2    LV EDV Index BP 69 mL/m2    LV ESV Index A4C 31 mL/m2    LV EDV Index A4C 68 mL/m2    LV ESV Index A2C 28 mL/m2    LV EDV Index A2C 65 mL/m2    LVIDd Index 3.21 cm/m2    LVIDs Index 2.24 cm/m2    LV RWT Ratio 0.48     LV Mass 2D 220.3 (A) 67 - 162 g  LV Mass 2D Index 141.2 (A) 43 - 95 g/m2    MV E/A 1.59     E/E' Ratio (Averaged) 24.61     E/E' Lateral 25.67     E/E' Septal 23.54     LA Volume Index BP 51 (A) 16 - 34 ml/m2    LA Volume Index A/L 54 16 - 34 mL/m2    LVOT Stroke Volume Index 51.3 mL/m2    LVOT Area 3.1 cm2    LA Volume Index A-L A2C 60 (A) 16 - 34 mL/m2    LA Volume Index A-L A4C 46 (A) 16 - 34 mL/m2    LA Volume Index MOD A2C 56 (A) 16 - 34 ml/m2    LA Volume Index MOD A4C 43 (A) 16 - 34 ml/m2    Ao Root Index 1.79 cm/m2    Ascending Aorta Index 1.73 cm/m2    AV Velocity Ratio 0.81     LVOT:AV VTI Index 0.73     AVA/BSA VTI 1.5 cm2/m2    AVA/BSA Peak Velocity 1.6 cm2/m2    Est. 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 *       See above narrative for full detail.      Signed by: Vernell Jacques Arrow, PA-C 12:59 PM     "

## 2024-09-11 NOTE — Consults (Signed)
 "                            [place] [facility]  Date of Birth: Jan 13, 1981          Assessment & Plan:     ESRD on HD MWF at [facility]  HTN  Gastroparesis s/p feeding tube  Staph bacteremia  DM2  Anemai    Rec:   No acute indication HD  HD tomorrow and MWF  ESA with dialysis  Symptomatic tx, GI eval       Subjective:   CC: ESRD  HPI: Patient seen for ESRD needs. She has ESRD on HD MWF at [facility] and DM2 c/b gastroparesis and is s/p GJ tube. She had a recent bacteremia and is on Ancef  with dialysis. Came in with N/V, abd pain. On tx for gastroparesis. Getting ECHO.  PMH: ESRD, HTN, DM2, Gastroparesis, mesenteric vascular dz  Current Facility-Administered Medications   Medication Dose Route Frequency    traMADol  (ULTRAM ) tablet 50 mg  50 mg Oral Q6H PRN    morphine  sulfate (PF) injection 2 mg  2 mg IntraVENous Q4H PRN    [START ON 09/12/2024] ceFAZolin  (ANCEF ) 2,000 mg in sterile water  20 mL IV syringe  2,000 mg IntraVENous Once per day on Monday Wednesday    [START ON 09/14/2024] ceFAZolin  (ANCEF ) 3,000 mg in sodium chloride  0.9 % 100 mL IVPB (Vial2Bag)  3,000 mg IntraVENous Once    sodium chloride  flush 0.9 % injection 5-40 mL  5-40 mL IntraVENous 2 times per day    sodium chloride  flush 0.9 % injection 5-40 mL  5-40 mL IntraVENous PRN    0.9 % sodium chloride  infusion   IntraVENous PRN    ondansetron  (ZOFRAN -ODT) disintegrating tablet 4 mg  4 mg Oral Q8H PRN    Or    ondansetron  (ZOFRAN ) injection 4 mg  4 mg IntraVENous Q6H PRN    acetaminophen  (TYLENOL ) tablet 650 mg  650 mg Oral Q6H PRN    Or    acetaminophen  (TYLENOL ) suppository 650 mg  650 mg Rectal Q6H PRN    sodium chloride  flush 0.9 % injection 5-40 mL  5-40 mL IntraVENous 2 times per day    sodium chloride  flush 0.9 % injection 5-40 mL  5-40 mL IntraVENous PRN    0.9 % sodium chloride  infusion   IntraVENous PRN    polyethylene glycol (GLYCOLAX ) packet 17 g  17 g Oral Daily PRN    insulin  glargine (LANTUS ) injection  vial 10 Units  10 Units SubCUTAneous Nightly    aspirin  chewable tablet 81 mg  81 mg Per J Tube Daily    atorvastatin  (LIPITOR ) tablet 80 mg  80 mg Per J Tube QHS    busPIRone  (BUSPAR ) tablet 5 mg  5 mg Per J Tube BID    carvedilol  (COREG ) tablet 25 mg  25 mg Per J Tube BID WC    clopidogrel  (PLAVIX ) tablet 75 mg  75 mg Per J Tube Daily    [Held by provider] hydrALAZINE  (APRESOLINE ) tablet 50 mg  50 mg Per J Tube TID    sodium bicarbonate  tablet 650 mg  650 mg Per J Tube TID    insulin  lispro (HUMALOG ,ADMELOG ) injection vial 0-4 Units  0-4 Units SubCUTAneous 4x Daily AC & HS    pantoprazole  (PROTONIX ) 40 mg in sodium chloride  (PF) 0.9 % 10 mL injection  40 mg IntraVENous  Daily    metoclopramide  (REGLAN ) injection 5 mg  5 mg IntraVENous Q6H    hydrALAZINE  (APRESOLINE ) injection 10 mg  10 mg IntraVENous Q6H    diazePAM  (VALIUM ) injection 2.5 mg  2.5 mg IntraVENous Q8H PRN          Objective:     Vitals:  Blood pressure (!) 118/58, pulse 71, temperature 97.9 F (36.6 C), temperature source Oral, resp. rate 16, height 1.499 m (4' 11.02), weight 61.2 kg (134 lb 14.7 oz), SpO2 98%.  Temp (24hrs), Avg:98 F (36.7 C), Min:97.7 F (36.5 C), Max:98.4 F (36.9 C)      Intake and Output:  12/02 0701 - 12/02 1900  In: 45 [I.V.:10]  Out: -   No intake/output data recorded.    Physical Exam:               GENERAL ASSESSMENT: NAD  HEENT: Nontraumatic   CHEST: CTA  HEART: S1S2  ABDOMEN: Soft,NT  EXTREMITY: no EDEMA; LUE AVG +t/b  NEURO: Grossly non focal          ECG/rhythm:    Data Review        Recent Labs     09/10/24  1857 09/11/24  0239   NA 131* 130*   K 4.6 4.4   CL 97* 98   CO2 21 19*   BUN 36* 39*   CREATININE 4.64* 4.59*   GLUCOSE 356* 173*   CALCIUM  7.9* 8.0*           : Brena Aran, MD  09/11/2024        [facility]:  [email or web address]  [email or web address]    [place] office:  [address] [place], [address]  [place], [place] [address]  Phone: [phone]  Fax :      [phone]    [facility] office:  [address]  [place], [place]  [address]  Phone - [phone]  Fax - [phone]                                                                                        "

## 2024-09-11 NOTE — Consults (Signed)
 Session ID: [id number]  Session Duration: 10 minutes  Language: Spanish  Interpreter ID: [id number]  Interpreter Name: Julio

## 2024-09-11 NOTE — Progress Notes (Signed)
"  1900: Bedside shift report from offgoing RN Sherel) given to oncoming RN Jodeen). Notified that pt was heavily sedated during day shift and GI advised to stop giving pt IV morphine  due to highly sedating effect on pt.     2000: Attempted to set up ordered tube feeds (8pm to 8 am) through pts J tube as ordered. Pt refused feedings at this time. Pt verbally stated I dont want that.     2037: pt called on call bell requesting morphine  for pain. Also called at 2240 and 2310 for same request.     2330: Patient care technician informed primary RN Jodeen) that pt had pulled her IV out. Primary RN attempted to reestablish IV access with no success.     2342: Spoke with pt about reasoning behind not getting Morphine  for pain via IV. Explained to pt that providers want to hold off on IV Morphine  due to its sedating effects and chance of respiratory despression and aspiration risk. Pt not happy with this and requests please give me morphine  for pain. PO Tramadol  was offered to pt. Pt states medication does not work. Morphine  for pain Explained to pt reasoning behind not being able to receive morphine  and that Tramadol  is available to pt.  Pt crying and frustrated at this time.     0002: Messaged [employer] (Nursing Supervisor) to see if an ultrasound guided IV could be placed on pt. New IV was placed via ultrasound.     80: Charge nurse gave pt Tramadol . Pt still upset about not receiving IV Morphine  for pain. Adamant that Tramadol  is not effective.     0117, 0135, 0142, 0156, 0202, 0215: Pt using call bell to notify nursing staff that pt needs Morphine  for pain.     [name] Hollace Petties NP that pt is adamently using call bell to ask for IV Morphine  despite explaining to pt why they cannot receive Morphine . Asked Petties NP to come to pt bedside to talk to pt. NP note below of encounter.                Petties Delon LABOR APRN - NP  Nurse Practitioner  Internal Medicine     Flowsheet Note      Addendum      Date of Service: 09/12/2024  2:50 AM       Patient upset that her pain is not adequately controlled. Requesting IV morphine  vehemently. Education provided:     Sedation / respiratory depression - Concern for over sedation during the day after receiving IV morphine  and Valium  in close proximity of one another.  Decreased gastric motility - Opioid pain medication causing and/ or worsening paresis.  Both place patient at risk for aspiration.     One time dose of Roxi ordered. Declining TF. Patient requesting to speak w/ other provider. Patient considering leaving AMA. Continue to monitor for sedation/ resp depression.        Revision History       0356, 0407, 0421: pt using call bell to notify nursing staff that pt would like IV Morphine  for pain.     9561: Offered PO Oxi ordered by Petties NP. Pt reluctantly took medication. Pt crying and upset. Pt still asking for IV Pain medication at this time.   "

## 2024-09-11 NOTE — Consults (Signed)
 "PSYCHIATRY CONSULTATION NOTE    Patient: 43 year old female  Date: 09/11/2024  Reason for Consult: Evaluation  Interpreter Used: [id number] (Spanish)    History of Present Illness    The patient is a 43 year old Spanish-speaking female with a medical history significant for hypertension, diabetes mellitus, diabetic gastroparesis, end-stage renal disease on dialysis, hyperlipidemia, and anxiety/depression, admitted on 12/1 for abdominal pain. She has a J-tube in place.    During evaluation today, she reports feeling very nervous and anxious. She does not appear agitated, distressed, or disorganized. She denies specific psychological triggers for anxiety but endorses years of chronic abdominal and back pain, which may contribute to her distress.    She reports severe insomnia, stating she had been unable to sleep for several days prior to admission. She describes nightmares almost nightly.    She denies suicidal ideation, homicidal ideation, auditory or visual hallucinations, and denies past suicide attempts. She denies panic attacks, denies trauma history, and reports no outpatient psychiatric care. She denies substance use.    She states she lives with family, is married, and has three daughters.    A review of the H&P note from internal medicine indicates she had been taking duloxetine  60 mg prior to admission, but this medication is not currently ordered in the hospital. She is currently receiving Buspar  5 mg twice daily and diazepam  2.5 mg IV PRN for anxiety.    She has been hypertensive since admission, likely multifactorial, including ESRD, pain, and anxiety. Given her young age, recent progression to dialysis, and significant chronic illness burden, her anxiety and sleep disruption are highly likely to be reactive to chronic medical stressors.    Past Psychiatric History    Diagnosed anxiety and depression    Duloxetine  60 mg daily prior to admission    No outpatient psychiatrist or therapist    No history of  psychiatric hospitalizations    No suicide attempts    No panic attacks    Substance Use History    Denies alcohol    Denies tobacco    Denies illicit substances    No misuse of prescription medications    Family & Social History    Married    Lives with family    Three daughters    Denies family psychiatric history    Mental Status Examination    Appearance: Cooperative, alert, lying in bed; appears stated age  Behavior: Calm, no agitation, appropriate with interpreter  Speech: Normal rate/volume; Spanish-speaking  Mood: Nervous  Affect: Mildly anxious, congruent  Thought Process: Linear, coherent  Thought Content: No SI/HI; no delusions noted  Perceptions: Denies AH/VH  Cognition: Alert; attention intact; no gross deficits  Insight: Fair  Judgment: Fair    Assessment    This is a 43 year old female with complex medical illness and recent transition to dialysis, who is experiencing anxiety, insomnia, and nightmares. Her anxiety appears reactive to chronic disease burden, pain, and recent medical decline rather than a primary anxiety disorder exacerbation. She denies safety concerns, psychosis, or mania.    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    Buspar  is safe in renal impairment with careful monitoring. Benzodiazepines should be used  cautiously due to risk of oversedation in ESRD, but her IV diazepam  dose is low.    She may benefit from a non-serotonergic, renally safe sleep/anxiety agent, as well as resumption of outpatient psychiatric care.    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 Medication Recommendations  (Renal-Safe)    Medication Recommendations (Renally Safe & First-Line)    Continue Buspar  5 mg BID    Safe in ESRD    May titrate gradually to 7.5 mg BID in 48-72 hours if anxiety persists    Avoid duloxetine , as it is contraindicated in ESRD.    For Anxiety and Depressive Symptoms (First-Line Option for ESRD):   Start Sertraline  25 mg daily    Rationale:    Sertraline  is the preferred SSRI in ESRD/dialysis patients    Safe with no renal dose adjustment    Treats anxiety, depression, and insomnia related to mood disturbance    Lower risk of GI irritation compared to other SSRIs (important with gastroparesis)    Long psychiatric track record and well tolerated    Monitoring:    May increase to 50 mg daily in 1-2 weeks if tolerated    Monitor BP and GI symptoms    Watch for mild initial nausea (usually improves within days)    Avoid:    Duloxetine  (renal contraindication)    Paroxetine (anticholinergic, withdrawal concerns)    Venlafaxine (accumulates significantly in renal failure)    Trazodone  (hypotension risk & limited benefit for primary anxiety)    2. Non-Pharmacologic Anxiety Management    Psychoeducation regarding dialysis adjustment    Encourage consistent sleep schedule    Pain optimization via primary team    Supportive psychotherapy while inpatient    Offer chaplain services (patient may benefit culturally/spiritually)    3. Medical Coordination    Continue dialysis as scheduled    Monitor BP--hypertension may worsen anxiety symptoms    Encourage GI and nephrology follow-up    4. Psychosocial    Provide list of Spanish-speaking outpatient therapists and psychiatrists    Encourage family involvement    Assess for potential depression related to chronic illness    5. Safety    Patient denies SI/HI; low acute risk    No need for 1:1 or suicide precautions    6. Follow Up    Psychiatry will continue to follow during hospitalization    Reassess sleep and anxiety response after medication adjustment    Geni Bihari FNP-C, PMHNP-BC  09/11/2024  "

## 2024-09-11 NOTE — Consults (Signed)
 Session ID: [id number]  Session Duration: 19 minutes  Language: Spanish  Interpreter ID: #[id number]  Interpreter Name: Sharolyn

## 2024-09-11 NOTE — Progress Notes (Signed)
 "                                                             [place] [facility]  [address] [place], [place] [address]  [phone]      Hospitalist  Progress Note      NAME:       Melinda [name]   DOB:        [birth year]  MRM:        [id number]    Date of service: 09/11/2024      Subjective: Patient seen and examined by me as a follow up. Patient admitted with  uncontrolled HTN, abdominal pain, persistent vomiting. She tells me she still has some aching abdominal discomfort. No fever or chills. BP better. Discussed with her through a Spanish video interpretor     Objective:    Vital Signs:    BP (!) 118/58   Pulse 71   Temp 97.9 F (36.6 C) (Oral)   Resp 16   Ht 1.499 m (4' 11.02)   Wt 61.2 kg (134 lb 14.7 oz)   SpO2 98%   BMI 27.24 kg/m        Intake/Output Summary (Last 24 hours) at 09/11/2024 1322  Last data filed at 09/11/2024 9078  Gross per 24 hour   Intake 45 ml   Output --   Net 45 ml        Current inpatient medications reviewed:  Current Facility-Administered Medications   Medication Dose Route Frequency    traMADol  (ULTRAM ) tablet 50 mg  50 mg Oral Q6H PRN    morphine  sulfate (PF) injection 2 mg  2 mg IntraVENous Q4H PRN    [START ON 09/12/2024] ceFAZolin  (ANCEF ) 2,000 mg in sterile water  20 mL IV syringe  2,000 mg IntraVENous Once per day on Monday Wednesday    [START ON 09/14/2024] ceFAZolin  (ANCEF ) 3,000 mg in sodium chloride  0.9 % 100 mL IVPB (Vial2Bag)  3,000 mg IntraVENous Once    [START ON 09/12/2024] epoetin  alfa-epbx (RETACRIT ) injection 10,000 Units  10,000 Units SubCUTAneous Once per day on Monday Wednesday Friday    polyethylene glycol (GLYCOLAX ) packet 17 g  17 g Per J Tube Daily    sodium chloride  flush 0.9 % injection 5-40 mL  5-40 mL IntraVENous 2 times per day    sodium chloride  flush 0.9 % injection 5-40 mL  5-40 mL IntraVENous PRN    0.9 % sodium chloride  infusion   IntraVENous PRN    ondansetron  (ZOFRAN -ODT) disintegrating tablet 4 mg  4 mg Oral  Q8H PRN    Or    ondansetron  (ZOFRAN ) injection 4 mg  4 mg IntraVENous Q6H PRN    acetaminophen  (TYLENOL ) tablet 650 mg  650 mg Oral Q6H PRN    Or    acetaminophen  (TYLENOL ) suppository 650 mg  650 mg Rectal Q6H PRN    sodium chloride  flush 0.9 % injection 5-40 mL  5-40 mL IntraVENous 2 times per day    sodium chloride  flush 0.9 % injection 5-40 mL  5-40 mL IntraVENous PRN    0.9 % sodium chloride  infusion   IntraVENous PRN    insulin  glargine (LANTUS ) injection vial 10 Units  10 Units SubCUTAneous Nightly    aspirin  chewable tablet 81  mg  81 mg Per J Tube Daily    atorvastatin  (LIPITOR ) tablet 80 mg  80 mg Per J Tube QHS    busPIRone  (BUSPAR ) tablet 5 mg  5 mg Per J Tube BID    carvedilol  (COREG ) tablet 25 mg  25 mg Per J Tube BID WC    clopidogrel  (PLAVIX ) tablet 75 mg  75 mg Per J Tube Daily    [Held by provider] hydrALAZINE  (APRESOLINE ) tablet 50 mg  50 mg Per J Tube TID    sodium bicarbonate  tablet 650 mg  650 mg Per J Tube TID    insulin  lispro (HUMALOG ,ADMELOG ) injection vial 0-4 Units  0-4 Units SubCUTAneous 4x Daily AC & HS    pantoprazole  (PROTONIX ) 40 mg in sodium chloride  (PF) 0.9 % 10 mL injection  40 mg IntraVENous Daily    metoclopramide  (REGLAN ) injection 5 mg  5 mg IntraVENous Q6H    hydrALAZINE  (APRESOLINE ) injection 10 mg  10 mg IntraVENous Q6H    diazePAM  (VALIUM ) injection 2.5 mg  2.5 mg IntraVENous Q8H PRN     Physical Examination:    General:   Weak and ill looking patient in no acute distress  Eyes:   pale conjunctivae, PERRLA with no discharge.  ENT:   no ottorrhea or rhinorrhea with dry mucous membranes  Neck: no masses, thyroid non-tender and trachea central.  Pulm:  no accessory muscle use, clear breath sounds without crackles or wheezes  Card:  no JVD or murmurs, has regular and normal S1, S2 without thrills, bruits or peripheral edema  Abd:  Soft, mild generalized abdominal discomfort, non-distended, normoactive bowel sounds   Musc:  No cyanosis, clubbing, atrophy or deformities.  Skin:   No rashes, bruising or ulcers.   Neuro: Awake and alert. Generally a non focal exam. Follows commands appropriately  Psych:  Has some insight to her illness     Diagnostic testing:    Laboratory data reviewed and independently interpreted:    Recent Labs     09/10/24  1857 09/11/24  0239   WBC 7.9 7.5   HGB 8.5* 8.3*   HCT 27.6* 27.3*   RBC 2.76* 2.69*   MCV 100.0* 101.5*   MCH 30.8 30.9   PLT 381 378     Lab Results   Component Value Date/Time    LACTA 0.5 08/09/2024 05:58 AM     Recent Labs     09/10/24  1857 09/11/24  0239   NA 131* 130*   K 4.6 4.4   CL 97* 98   CO2 21 19*   GLUCOSE 356* 173*   BUN 36* 39*   CREATININE 4.64* 4.59*   CALCIUM  7.9* 8.0*   MG 1.9  --    BILITOT 0.3 0.2   ALKPHOS 241* 213*   AST 44* 34   ALT 24 24     No components found for: GLUCOSEPOC  Lab Results   Component Value Date/Time    CHOL 192 08/07/2024 11:19 AM    TRIG 352 08/14/2024 07:48 AM    HDL 80 08/07/2024 11:19 AM       Imaging data reviewed:    CT ABDOMEN PELVIS WO CONTRAST Additional Contrast? None  Result Date: 09/10/2024  1. Very small right pleural effusion, new compared to prior CT dated August 06, 2024. 2. Percutaneous GJ tube extends to the jejunum. No bowel obstruction, ileus or perforation. No intra-abdominal abscess. 3. Small amount of free intraperitoneal fluid, new compared to prior CT dated October  2025. Electronically signed by Krystal Grippe, MD    Cultures, Imaging studies reviewed and reports noted, Telemetry reviewed and independently interpreted by me and available notes from other care providers - all reviewed by me on: 09/11/2024    Assessment and Plan:    Diabetic gastroparesis associated with type 2 diabetes mellitus POA: still with abdominal pain and vomiting. Has a GJ tube although she usually eats by mouth. Given her symptoms, continue to keep NPO, consult dietician for a temporary tube feeds, IV Pantoprazole . IV Reglan . 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 Now NPO. Blood  glucose stable. Continue low dose Lantus , Lispro sliding scale. DM diet when eating    Hypertensive urgency / Hyperlipidemia POA: BP now better. Continue Atorvastatin , Carvedilol  and monitor    MSSA bacteremia POA: diagnosed during her recent admission and was seen by ID. Continue IV Ancef  after HD through 12/10    End-stage renal disease on hemodialysis POA: on a MWF schedule. Consult nephrology. 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 Continue Asprin, Plavix  and Atorvastatin     Non-ischemic myocardial injury (non-traumatic) POA: has a chronically abnormal troponin, likely from renal disease. Echo showed EF of 55%. Normal wall motion.     Severe anxiety / Depression POA: continue Buspar     Care Plan discussed with: Patient, Nursing, CM     Prophylaxis:  H2B/PPI                Expected Disposition:  Home w/Family    PCP:      No primary care provider on file.     I have personally examined and treated the patient at bedside during this period. To assist coordination of care and communication with nursing and staff, this note may be preliminary early in the day, but finalized by end of the day.         ___________________________________________________    Attending Physician:   Lamar Doom, MD     "

## 2024-09-11 NOTE — Plan of Care (Signed)
"    Problem: Discharge Planning  Goal: Discharge to home or other facility with appropriate resources  Outcome: Progressing  Flowsheets (Taken 09/11/2024 0016 by Joyce Harlene Garre, RN)  Discharge to home or other facility with appropriate resources: Identify barriers to discharge with patient and caregiver     Problem: Safety - Adult  Goal: Free from fall injury  Outcome: Progressing     Problem: Skin/Tissue Integrity  Goal: Skin integrity remains intact  Description: 1.  Monitor for areas of redness and/or skin breakdown  2.  Assess vascular access sites hourly  3.  Every 4-6 hours minimum:  Change oxygen saturation probe site  4.  Every 4-6 hours:  If on nasal continuous positive airway pressure, respiratory therapy assess nares and determine need for appliance change or resting period  Outcome: Progressing  Flowsheets (Taken 09/11/2024 0016 by Joyce Harlene Garre, RN)  Skin Integrity Remains Intact: Monitor for areas of redness and/or skin breakdown     "

## 2024-09-11 NOTE — Consults (Signed)
 Session ID: [id number]  Session Duration: 4 minutes  Language: Spanish  Interpreter ID: #[id number]  Interpreter Name: Gloriann

## 2024-09-12 LAB — HEPATITIS B SURFACE ANTIGEN: Hepatitis B Surface Ag: NONREACTIVE

## 2024-09-12 LAB — EKG 12-LEAD
Atrial Rate: 89 {beats}/min
Diagnosis: NORMAL
P Axis: 22 degrees
P-R Interval: 186 ms
Q-T Interval: 418 ms
QRS Duration: 86 ms
QTc Calculation (Bazett): 508 ms
R Axis: -81 degrees
T Axis: -45 degrees
Ventricular Rate: 89 {beats}/min

## 2024-09-12 LAB — POCT GLUCOSE
POC Glucose: 142 mg/dL — ABNORMAL HIGH (ref 65–117)
POC Glucose: 86 mg/dL (ref 65–117)

## 2024-09-12 LAB — HEPATITIS B SURFACE ANTIBODY: Hep B S Ab: <3.5 m[IU]/mL

## 2024-09-12 MED ORDER — PROCHLORPERAZINE EDISYLATE 10 MG/2ML IJ SOLN
10 | Freq: Four times a day (QID) | INTRAMUSCULAR | Status: DC
Start: 2024-09-12 — End: 2024-09-12

## 2024-09-12 MED ORDER — PROMETHAZINE HCL 6.25 MG/5ML PO SOLN
6.25 | Freq: Four times a day (QID) | ORAL | 0 refills | 7.00000 days | Status: AC | PRN
Start: 2024-09-12 — End: 2024-09-22

## 2024-09-12 MED ORDER — PANTOPRAZOLE SODIUM 40 MG IV SOLR
40 | Freq: Two times a day (BID) | INTRAVENOUS | Status: DC
Start: 2024-09-12 — End: 2024-09-12

## 2024-09-12 MED ORDER — TRAMADOL HCL 50 MG PO TABS
50 | ORAL_TABLET | Freq: Four times a day (QID) | ORAL | 0 refills | 7.00000 days | Status: AC | PRN
Start: 2024-09-12 — End: 2024-09-19

## 2024-09-12 MED ORDER — OXYCODONE HCL 5 MG PO TABS
5 | Freq: Once | ORAL | Status: AC
Start: 2024-09-12 — End: 2024-09-12
  Administered 2024-09-12: 10:00:00 5 mg via ORAL

## 2024-09-12 MED FILL — POLYETHYLENE GLYCOL 3350 17 G PO PACK: 17 g | ORAL | Qty: 1 | Fill #0

## 2024-09-12 MED FILL — SODIUM BICARBONATE 650 MG PO TABS: 650 mg | ORAL | Qty: 1 | Fill #0

## 2024-09-12 MED FILL — ASPIRIN 81 MG PO CHEW: 81 mg | ORAL | Qty: 1 | Fill #0

## 2024-09-12 MED FILL — CARVEDILOL 12.5 MG PO TABS: 12.5 mg | ORAL | Qty: 2 | Fill #0

## 2024-09-12 MED FILL — TRAMADOL HCL 50 MG PO TABS: 50 mg | ORAL | Qty: 1 | Fill #0

## 2024-09-12 MED FILL — RETACRIT 10000 UNIT/ML IJ SOLN: 10000 [IU]/mL | INTRAMUSCULAR | Qty: 1 | Fill #0

## 2024-09-12 MED FILL — BUSPIRONE HCL 5 MG PO TABS: 5 mg | ORAL | Qty: 1 | Fill #0

## 2024-09-12 MED FILL — OXYCODONE HCL 5 MG PO TABS: 5 mg | ORAL | Qty: 1 | Fill #0

## 2024-09-12 MED FILL — DIAZEPAM 5 MG/ML IJ SOLN: 5 mg/mL | INTRAMUSCULAR | Qty: 2 | Fill #0

## 2024-09-12 MED FILL — LANTUS 100 UNIT/ML SC SOLN: 100 [IU]/mL | SUBCUTANEOUS | Qty: 10 | Fill #0

## 2024-09-12 MED FILL — PANTOPRAZOLE SODIUM 40 MG IV SOLR: 40 mg | INTRAVENOUS | Qty: 40 | Fill #0

## 2024-09-12 MED FILL — HYDRALAZINE HCL 20 MG/ML IJ SOLN: 20 mg/mL | INTRAMUSCULAR | Qty: 1 | Fill #0

## 2024-09-12 MED FILL — METOCLOPRAMIDE HCL 5 MG/ML IJ SOLN: 5 mg/mL | INTRAMUSCULAR | Qty: 2 | Fill #0

## 2024-09-12 MED FILL — CLOPIDOGREL BISULFATE 75 MG PO TABS: 75 mg | ORAL | Qty: 1 | Fill #0

## 2024-09-12 MED FILL — ATORVASTATIN CALCIUM 20 MG PO TABS: 20 mg | ORAL | Qty: 4 | Fill #0

## 2024-09-12 NOTE — Progress Notes (Signed)
 "                            [facility] [facility] [place]  Date of Birth: Jun 27, 1981          Assessment & Plan:     ESRD on HD MWF at [facility]  HTN  Gastroparesis s/p feeding tube  Staph bacteremia  DM2  Anemai    Rec:   Patient is receiving HD treatment.  HD MWF  ESA with dialysis  Symptomatic tx, GI eval       Subjective:   CC: ESRD  HPI: Patient is receiving HD treatment in HD suite.  Denies any nausea or vomiting  PMH: ESRD, HTN, DM2, Gastroparesis, mesenteric vascular dz  Current Facility-Administered Medications   Medication Dose Route Frequency    prochlorperazine  (COMPAZINE ) injection 10 mg  10 mg IntraVENous Q6H    pantoprazole  (PROTONIX ) 40 mg in sodium chloride  (PF) 0.9 % 10 mL injection  40 mg IntraVENous Q12H    traMADol  (ULTRAM ) tablet 50 mg  50 mg Oral Q6H PRN    morphine  sulfate (PF) injection 2 mg  2 mg IntraVENous Q4H PRN    ceFAZolin  (ANCEF ) 2,000 mg in sterile water  20 mL IV syringe  2,000 mg IntraVENous Once per day on Monday Wednesday    [START ON 09/14/2024] ceFAZolin  (ANCEF ) 3,000 mg in sodium chloride  0.9 % 100 mL IVPB (Vial2Bag)  3,000 mg IntraVENous Once    epoetin  alfa-epbx (RETACRIT ) injection 10,000 Units  10,000 Units SubCUTAneous Once per day on Monday Wednesday Friday    polyethylene glycol (GLYCOLAX ) packet 17 g  17 g Per J Tube Daily    sodium chloride  flush 0.9 % injection 5-40 mL  5-40 mL IntraVENous 2 times per day    sodium chloride  flush 0.9 % injection 5-40 mL  5-40 mL IntraVENous PRN    0.9 % sodium chloride  infusion   IntraVENous PRN    ondansetron  (ZOFRAN -ODT) disintegrating tablet 4 mg  4 mg Oral Q8H PRN    Or    ondansetron  (ZOFRAN ) injection 4 mg  4 mg IntraVENous Q6H PRN    acetaminophen  (TYLENOL ) tablet 650 mg  650 mg Oral Q6H PRN    Or    acetaminophen  (TYLENOL ) suppository 650 mg  650 mg Rectal Q6H PRN    sodium chloride  flush 0.9 % injection 5-40 mL  5-40 mL IntraVENous 2 times per day    sodium chloride  flush 0.9 % injection 5-40 mL   5-40 mL IntraVENous PRN    0.9 % sodium chloride  infusion   IntraVENous PRN    insulin  glargine (LANTUS ) injection vial 10 Units  10 Units SubCUTAneous Nightly    aspirin  chewable tablet 81 mg  81 mg Per J Tube Daily    atorvastatin  (LIPITOR ) tablet 80 mg  80 mg Per J Tube QHS    busPIRone  (BUSPAR ) tablet 5 mg  5 mg Per J Tube BID    carvedilol  (COREG ) tablet 25 mg  25 mg Per J Tube BID WC    clopidogrel  (PLAVIX ) tablet 75 mg  75 mg Per J Tube Daily    hydrALAZINE  (APRESOLINE ) tablet 50 mg  50 mg Per J Tube TID    sodium bicarbonate  tablet 650 mg  650 mg Per J Tube TID    insulin  lispro (HUMALOG ,ADMELOG ) injection vial 0-4 Units  0-4 Units SubCUTAneous 4x Daily AC & HS    [  Held by provider] hydrALAZINE  (APRESOLINE ) injection 10 mg  10 mg IntraVENous Q6H          Objective:     Vitals:  Blood pressure (!) 172/84, pulse 77, temperature 97.9 F (36.6 C), temperature source Oral, resp. rate 18, height 1.499 m (4' 11.02), weight 61.2 kg (134 lb 14.7 oz), SpO2 95%.  Temp (24hrs), Avg:97.9 F (36.6 C), Min:97.3 F (36.3 C), Max:98.6 F (37 C)      Intake and Output:  12/03 0701 - 12/03 1900  In: 480 [P.O.:240]  Out: -   12/01 1901 - 12/03 0700  In: 45 [I.V.:10]  Out: -     Physical Exam:               GENERAL ASSESSMENT: NAD  HEENT: Nontraumatic   CHEST: CTA  HEART: S1S2  ABDOMEN: Soft,NT  EXTREMITY: no EDEMA; LUE AVG +t/b  NEURO: Grossly non focal          ECG/rhythm:    Data Review        Recent Labs     09/10/24  1857 09/11/24  0239   NA 131* 130*   K 4.6 4.4   CL 97* 98   CO2 21 19*   BUN 36* 39*   CREATININE 4.64* 4.59*   GLUCOSE 356* 173*   CALCIUM  7.9* 8.0*           : Shade Kaley [name], MD  09/12/2024        [facility]:  [email or web address]  [email or web address]    [place] office:  [address] [place], [address]  [place], [place] [address]  Phone: [phone]  Fax :     [phone]    [facility] office:  [address]  [place], [place]  [address]  Phone - [phone]  Fax -  [phone]                                                                                        "

## 2024-09-12 NOTE — Consults (Signed)
 Session ID: [id number]  Session Duration: 7 minutes  Language: Spanish  Interpreter ID: #[id number]  Interpreter Name: Inocente

## 2024-09-12 NOTE — Dialysis (Signed)
"    Hepatitis B Surface Ag   Date/Time Value Ref Range Status   09/11/2024 06:10 PM NONREACTIVE NR   Final     Hep B S Ag Interp   Date/Time Value Ref Range Status   03/16/2023 08:19 AM Negative NEG   Final     Hep B S Ab   Date/Time Value Ref Range Status   09/11/2024 06:10 PM <3.50 mIU/mL Final     Comment:        <10.00 mIU/mL     Non-Immune  =>10.00 mIU/mL    Individual is considered to be immune to infection with HBV       Hep B results, dates, and Primary Source: epic / 09/11/24 surface antigen negative / surface antibodies susceptible   Hep B dual verification performed for (RN): CHRISTELLA Maas RN  Machine disinfect process:heat, vinegar,standard  "

## 2024-09-12 NOTE — Progress Notes (Addendum)
"  1057: RN notified MD regarding pts scheduled medications (Compazine , Sodium Bicarbonate , and Hydralazine ). Pt was off unit receiving Dialysis during morning due time. Pt afternoon due time approaching. Pt BP 172/84 Map 113.     1100: MD aware. Per MD, give morning dose of Coreg  25mg  and resume scheduled medications at 1400.    Pt approached RN at main nurses station regarding discharge paperwork. RN let pt know that discharge paperwork is being worked on. However, pt IV needs to be removed as well as telemetry. Pt states that she removed her own IV and telemetry. Pt standing in hallway in front of another pts room. Pt refusing to go back to her room. RN assessed previous IV site; no bleeding noted.     1409: RN notified MD regarding pt uncooperativeness and IV removal. MD aware, per MD pt can discharge.     Discharge paperwork given to pt. Pt discharged.   "

## 2024-09-12 NOTE — Flowsheet Note (Signed)
 "Primary RN SBAR: Joyce Raisin, RN  Patient Education provided: Safety/ dietary  Preferred Education method and Primary language: speak a little English/ spanish  Dialysis consent: chronic consent applied  Hospital General Consent Verified: yes  Hospital associated wait time; reason: none  Patient to Staff Ratio (1:1 or 2:1) : 2:1   Hepatitis B Surface Ag   Date/Time Value Ref Range Status   09/11/2024 06:10 PM NONREACTIVE NR   Final     Hep B S Ag Interp   Date/Time Value Ref Range Status   03/16/2023 08:19 AM Negative NEG   Final     Hep B S Ab   Date/Time Value Ref Range Status   09/11/2024 06:10 PM <3.50 mIU/mL Final     Comment:        <10.00 mIU/mL     Non-Immune  =>10.00 mIU/mL    Individual is considered to be immune to infection with HBV       Hep B results, dates, and Primary Source: Negative/ Susceptible 09/11/2024 Hospital Epic  Hep B dual verification performed by (RN): Will Odor, RN  Machine disinfect process: heat       09/12/24 0617   Treatment   Treatment Goal 3.5H/1L   Observations & Evaluations   Level of Consciousness 0   Oriented X 4   Heart Rhythm   (remote tele in progress)   Respiratory Quality/Effort Unlabored   O2 Device None (Room air)   Skin Color   (appropruate for ethnicity)   Skin Condition/Temp Dry;Warm   Appetite Good   Abdomen Inspection Soft   Last BM (including prior to admit) 09/10/24   Edema None   Edema Generalized None   Vital Signs   BP (!) 143/86   Temp 98.2 F (36.8 C)   Pulse 76   Respirations 17   SpO2 96 %   Pain Assessment   Pain Assessment 0-10   Pain Level 8   Patient's Stated Pain Goal 0 - No pain   Pain Location Back   Pain Descriptors Aching;Discomfort   Technical Checks   Dialysis Machine No. [id number]   RO Machine Number [id number]   Dialyzer Lot No. [id number]   Tubing Lot Number [id number]   All Connections Secure Yes   NS Bag Yes   Saline Line Double Clamped Yes   Dialyzer F-180   Prime Volume (mL) 250 mL   ICEBOAT I;C;E;B;O;A;T   RO Machine Log Sheet  Completed Yes   Machine Alarm Self Test Completed;Chief Strategy Officer;Tested;pH Reading   Extracorporeal Circuit Tested for Integrity Yes   Machine Conductivity 14.6   Manual Ph 7.4   Bleach Test (Neg) Yes   Bath Temperature 98.6 F (37 C)   Dialysis Bath   K+ (Potassium) 3   Ca+ (Calcium ) 2.5   Na+ (Sodium) 138   HCO3 (Bicarb) 35   Bicarbonate Concentrate Lot No. [id number]   Acid Concentrate Lot No. [id number]   Handoff   Communication Given   (PRe HD)   Handoff Given To Ladell Hollering, RN   Handoff Received From Joyce Raisin Garre, RN   Handoff Communication Telephone     Each access site disinfected for 60 seconds per site with alcohol swabs per P&P. Cannulated with 15G needles x2 and secured with paper tape.   +aspiration/+flushed.     Patient's consent, code status, medications checked.  Safety checks complete, time out performed.   assessed, +bruit/thrill, no redness, warmth or drainage noted. Skin prepped per procedure using chloraprep scrub x 30 sec, followed by 30 sec dry time each site. Cannulated using 15g needles each site and secured with tape. +flash/aspiration/flush. HD initiated. Access visible, lines secure. Medications reviewed.      09/12/24 0630   During Hemodialysis Assessment   BP 139/73   Pulse 76   Blood Flow Rate (ml/min) 350 ml/min   Arterial Pressure (mmHg) -110 mmHg   Venous Pressure (mmHg) 100   TMP 10   DFR 500   Comments   (HD initiated)   Access Visible Yes   Ultrafiltration Rate (ml/hr) 430 ml/hr   Ultrafiltration Removed (ml) 0 ml   Pain Assessment   Pain Assessment 0-10   Pain Level 8   Pain Location Back   Pain Descriptors Aching;Discomfort   Patient's Stated Pain Goal 0 - No pain      09/12/24 1000   During Hemodialysis Assessment   BP (!) 167/85   Pulse 74   Blood Flow Rate (ml/min) 400 ml/min   Arterial Pressure (mmHg) -170 mmHg   Venous Pressure (mmHg) 160   TMP 10   DFR 500   Comments   (HD completed)   Access Visible Yes    Ultrafiltration Rate (ml/hr) 430 ml/hr   Ultrafiltration Removed (ml) 1500 ml     POST HD     09/12/24 1010   Treatment   Time Off 1000   Observations & Evaluations   Level of Consciousness 0   Oriented X 4   Heart Rhythm Regular   Respiratory Quality/Effort Unlabored   O2 Device None (Room air)   Bilateral Breath Sounds Clear;Diminished   Skin Color   (appropriate for ethnicity)   Skin Condition/Temp Dry;Warm   Appetite Good   Abdomen Inspection Soft   Bowel Sounds (All Quadrants) Present   Last BM (including prior to admit) 09/10/24   Edema None   Vital Signs   BP (!) 167/83   Temp 97.9 F (36.6 C)   Pulse 75   Respirations 17   SpO2 98 %   Hemodialysis Fistula/Graft Arteriovenous fistula Left Arm   Placement Date/Time: 08/06/24 1626   Present on Admission/Arrival: Yes  Access Type: Arteriovenous fistula  Orientation: Left  Access Location: Arm   Site Assessment Clean, dry & intact   Thrill Present   Bruit Present   Status Deaccessed   Post-Hemodialysis Assessment   Post-Treatment Procedures Blood returned;Access bleeding time < 10 minutes   Machine Disinfection Process Acid/Vinegar Clean;Heat Disinfect;Exterior Engineer, Agricultural Volume (ml) 250 ml   Blood Volume Processed (Liters) 79.2 L   Dialyzer Clearance Moderately streaked   Duration of Treatment (minutes) 210 minutes   Hemodialysis Intake (ml) 500 ml   Hemodialysis Output (ml) 1500 ml   NET Removed (ml) 1000   Tolerated Treatment Good   Patient Response to Treatment tolerated well   Patient Disposition Return to room   Handoff   Communication Given   (POST HD)   Handoff Given To Cloria Dillon, RN   Handoff Received From Ladell Hollering, RN   Handoff Communication Telephone     Treatment tolerated well, goal achieved  Returned to patient room stable.  Reports given to primary RN    "

## 2024-09-12 NOTE — Discharge Summary (Signed)
 "                                                [place] [facility]  [address] [place], [place], [place] [address]  Tel: [phone]    Hospital Medicine Discharge Summary    Patient ID:    Melinda [name]  Age:              43 y.o.    DOB: Apr 28, 1981  MRN:             [id number]     PCP: No primary care provider on file.     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 *       Reason for admission:    Abdominal pain [R10.9]  Generalized abdominal pain [R10.84]  Hypertensive urgency [I16.0]  Abdominal pain, unspecified abdominal location [R10.9]    Diagnostic testing:    Laboratory data reviewed and independently interpreted:    Recent Labs     09/10/24  1857 09/11/24  0239   WBC 7.9 7.5   HGB 8.5* 8.3*   HCT 27.6* 27.3*   RBC 2.76* 2.69*   MCV 100.0* 101.5*   MCH 30.8 30.9   PLT 381 378     Lab Results   Component Value Date/Time    LACTA 0.5 08/09/2024 05:58 AM     Recent Labs     09/10/24  1857 09/11/24  0239   NA 131* 130*   K 4.6 4.4   CL 97* 98   CO2 21 19*   GLUCOSE 356* 173*   BUN 36* 39*   CREATININE 4.64* 4.59*   CALCIUM  7.9* 8.0*   MG 1.9  --    BILITOT 0.3 0.2   ALKPHOS 241* 213*   AST 44* 34   ALT 24 24     No components found for: GLUCOSEPOC  Lab Results   Component Value Date/Time    CHOL 192 08/07/2024 11:19 AM    TRIG 352 08/14/2024 07:48 AM    HDL 80 08/07/2024 11:19 AM     Imaging data reviewed:    CT ABDOMEN PELVIS WO CONTRAST Additional  Contrast? None  Result Date: 09/10/2024  1. Very small right pleural effusion, new compared to prior CT dated August 06, 2024. 2. Percutaneous GJ tube extends to the jejunum. No bowel obstruction, ileus or perforation. No intra-abdominal abscess. 3. Small amount of free intraperitoneal fluid, new compared to prior CT dated October 2025. Electronically signed by Krystal Grippe, MD    Hospital Course:     Melinda [name] is a 43 y.o. admitted to [facility] and treated for the following:    Diabetic gastroparesis associated with type 2 diabetes mellitus POA:  her symptoms have improved. Has a GJ tube although she usually eats by mouth. She has tolerated her diet. Seen by GI. Discussed with case management teams and she cannot go to a SNF given she has no insurance to cover this. She has requested to go home given she is feeling better. Discharged in stable condition on a pureed diet, Lansoprazole , Phenergan  and Tramadol  PRN severe pain.      Diabetes mellitus type 2 with complications POA: A1c 7.9. Blood glucose stable. Continue low dose Lantus , Lispro sliding scale. Pureed DM diet.      Hypertensive urgency / Hyperlipidemia POA: BP now better. Continue Atorvastatin , Carvedilol  and monitor     MSSA bacteremia POA: diagnosed during her recent admission and was seen by ID. Continue IV Ancef  after HD through 12/10. Discussed with nephrology     End-stage renal disease on hemodialysis POA: on a MWF schedule. 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 Continue Asprin, Plavix  and Atorvastatin      Non-ischemic myocardial injury (non-traumatic) POA: has a chronically abnormal troponin, likely from renal disease. Echo showed EF of 55%. Normal wall motion.      Severe anxiety / Depression POA: continue Buspar , Cymbalta     Discharge Exam:  BP (!) 172/84   Pulse 77   Temp 97.9 F (36.6 C) (Oral)   Resp 18   Ht 1.499 m (4' 11.02)   Wt 61.2 kg (134 lb 14.7 oz)   SpO2 95%   BMI 27.24  kg/m      Patient has been seen and examined.    General: well looking and in no acute distress  Pulm: clear breath sounds without wheezes  Card: no murmurs, normal S1, S2 without thrills, bruits   Abd:    soft, non-tender, normoactive bowel sounds  Skin: no rashes and skin turgor is good  Neuro: awake, alert and has a non focal     Activity: Activity as tolerated    Diet: diabetic diet and renal diet    Current Discharge Medication List        START taking these medications    Details   promethazine  (PHENERGAN ) 6.25 MG/5ML syrup 20 mLs by Per J Tube route every 6 hours as needed for Nausea  Qty: 240 mL, Refills: 0      traMADol  (ULTRAM ) 50 MG tablet Take 1 tablet by mouth every 6 hours as needed for Pain for up to 15 doses. Max Daily Amount: 200 mg  Qty: 15 tablet, Refills: 0    Comments: Reduce doses taken as pain becomes manageable  Associated Diagnoses: Intractable abdominal pain           CONTINUE these medications which have NOT CHANGED    Details   aspirin  81 MG chewable tablet 1 tablet by Per J Tube route daily  Qty: 30 tablet, Refills: 3      atorvastatin  (LIPITOR ) 80 MG tablet 1 tablet by Per J Tube route nightly  Qty: 30 tablet, Refills: 3      busPIRone  (BUSPAR ) 5 MG tablet 1 tablet by Per J Tube route in the morning and at bedtime  Qty: 90 tablet, Refills: 0      carvedilol  (COREG ) 25 MG tablet 1 tablet by Per J Tube route 2 times daily (with meals)  Qty: 60 tablet, Refills: 3      ceFAZolin  (ANCEF ) 500 MG injection cefazolin  2/2/3 post HD until 09/19/24  Qty: 1 each, Refills: 0  clopidogrel  (PLAVIX ) 75 MG tablet 1 tablet by Per J Tube route daily  Qty: 30 tablet, Refills: 3      dicyclomine  (BENTYL ) 10 MG capsule 1 capsule by Per J Tube route 3 times daily (before meals)  Qty: 120 capsule, Refills: 3      DULoxetine  (CYMBALTA ) 60 MG extended release capsule 1 capsule by Per G Tube route daily  Qty: 30 capsule, Refills: 3      famotidine  (PEPCID ) 20 MG tablet 1 tablet by Per J Tube route  daily  Qty: 60 tablet, Refills: 3      hydrALAZINE  (APRESOLINE ) 50 MG tablet 1 tablet by Per J Tube route 3 times daily  Qty: 90 tablet, Refills: 3      insulin  glargine (LANTUS ) 100 UNIT/ML injection vial Inject 10 Units into the skin nightly  Qty: 10 mL, Refills: 3      lansoprazole  (PREVACID  SOLUTAB) 30 MG disintegrating tablet 1 tablet by Per J Tube route 2 times daily  Qty: 30 tablet, Refills: 3      metoclopramide  (REGLAN ) 5 MG/5ML solution 5 mLs by Per J Tube route 3 times daily (before meals)  Qty: 750 mL, Refills: 3      sodium bicarbonate  650 MG tablet 1 tablet by Per J Tube route 3 times daily  Qty: 90 tablet, Refills: 0           STOP taking these medications       sennosides-docusate sodium  (SENOKOT-S) 8.6-50 MG tablet Comments:   Reason for Stopping:               No follow-up provider specified.    Follow-up tests or labs: As noted above    Discharge Condition: Stable    Disposition: home    Time taken to co-ordinate and arrange discharge:  more than 30 minutes.    Signed:  Lamar Doom, MD     [facility]  O: [phone]  F: [phone]    09/12/2024   1:28 PM  "

## 2024-09-12 NOTE — Discharge Instructions (Addendum)
 "Hospital Medicine DISCHARGE INSTRUCTIONS    NAME: Melinda [name]   DOB:  12-27-1980   MRN:  [id number]     Date:     09/12/2024    Admission date: 09/10/2024     Discharge date:  09/12/2024     Reason for your admission:  Abdominal pain [R10.9]  Generalized abdominal pain [R10.84]  Hypertensive urgency [I16.0]  Abdominal pain, unspecified abdominal location [R10.9]    Discharge Diagnoses:  Diabetic gastroparesis  HTN urgency    DISCHARGE INSTRUCTIONS:    Thank you for allowing us  to participate in your care. Your discharging Hospitalist is Lamar Doom, MD. [name] were admitted for evaluation and treatment for the above diagnoses.    Medications:     It is important that medications are taken exactly as they are prescribed on the discharge medication instructions and keep them your  in the bottles provided by the pharmacist.   Keep a list of the medication names, dosages, and times to be taken at all times.    Do not take other medications without consulting your doctor.     Recommended diet:  Pureed diabetic renal diet    Recommended activity: activity as tolerated    Post discharge care:    For questions regarding your Hospitalization or to contact the Hospital Medicine team, please call [phone]     Notify follow up health care provider or return to the emergency department if you cannot get hold of your doctor if you feel worse or experience symptoms similar to those that brought you to hospital    No follow-up provider specified.     Information obtained by :  I understand that if any problems occur once I am at home I am to contact my physician and I understand and acknowledge receipt of the instructions indicated above.                                                                                                                                           Physician's or R.N.'s Signature                                                                  Date/Time  Patient or Representative Signature                                                          Date/Time  "

## 2024-09-12 NOTE — Flowsheet Note (Addendum)
"  Patient upset that her pain is not adequately controlled. Requesting IV morphine  vehemently. Education provided:    Sedation / respiratory depression - Concern for over sedation during the day after receiving IV morphine  and Valium  in close proximity of one another.  Decreased gastric motility - Opioid pain medication causing and/ or worsening paresis.  Both place patient at risk for aspiration.    One time dose of Roxi ordered. Declining TF. Patient requesting to speak w/ other provider. Patient considering leaving AMA. Continue to monitor for sedation/ resp depression.  "

## 2024-09-12 NOTE — Consults (Signed)
 Session ID: [id number]  Session Duration: 10 minutes  Language: Spanish  Interpreter ID: #[id number]  Interpreter Name: Candelario

## 2024-09-12 NOTE — Plan of Care (Signed)
"    Problem: Discharge Planning  Goal: Discharge to home or other facility with appropriate resources  Outcome: Progressing     Problem: Safety - Adult  Goal: Free from fall injury  Outcome: Progressing     Problem: Nutrition Deficit:  Goal: Optimize nutritional status  Outcome: Progressing     "

## 2024-09-12 NOTE — Care Coordination (Signed)
"  Care Management Discharge Note:      09/12/24 1342   Discharge Planning    Location Prior to Acute Admission House   Patient expects to be discharged to: Home   Services At/After Discharge   Who will provide transportation at discharge? Family     Patient with dc orders. Patient will dc home with continued OP dialysis at [facility] MWF. Family will transport home at time of discharge.   ______________________  Ronal Greet BSN, RN  Care Management  09/12/2024    "

## 2024-09-12 NOTE — BSMART Note (Signed)
 "Initial BSMART Liaison Assessment Form     Section I - Integrated Summary    Reason for consult is: anxiety .    LOS:  2     Presenting problem/Summary:    Pt was seen face to face on the medical floor. Liaison is familiar with pt from previous admission last month. Pt was laying in bed with covers over her head. Language interpretor services was used.  Pt presented to ED with abdominal pain. Pt has a history of , diabetes mellitus, diabetic gastroparesis, end-stage renal disease on dialysis, hyperlipidemia,   Pt reported she continues to experience a lot of anxiety due to her medical issues. Pt also reported that sleep is poor.     Pt denied any previous trauma and no additional triggers for her anxiety. Pt reported that she is use to working a lot and all these hospital admissions are making her more anxious. Pt denies any substance use. Pt denies SI/HI/AH/VH. Pt is open to receiving referrals for outpatient therapy.       Precipitant Factors are multiple medical problems .    The information is given by the patient.  Current Psychiatrist and/or Case Manager is none .  Previous Hospitalizations/Treatment: none     Plan: Defer to the most recent psychiatric consult note for recommendations. Liaison will provide resources for therapy     Lethality Assessment:  The potential for suicide is not noted.    The potential for homicide is not noted.  The patient has not been a perpetrator of sexual or physical abuse.    There are not pending charges.  The patient is not felt to be at risk for self-harm or harm to others.      Section II - Psychosocial  The patient's overall mood and attitude is anxious .  Feelings of helplessness and hopelessness are not observed.  Generalized anxiety is not observed.  Panic is not observed. Phobias are not observed.  Obsessive compulsive tendencies are not observed.      Section III - Mental Status Exam  The patient's appearance shows no evidence of impairment.  The patient's behavior  shows no evidence of impairment. The patient is oriented to time, place, person and situation.  The patient's speech is soft.  The patient's mood is anxious.  The range of affect is flat.  The patient's thought content demonstrates no evidence of impairment.  The thought process shows no evidence of impairment.  The patient's perception shows no evidence of impairment. The patient's memory shows no evidence of impairment.  The patient's appetite shows no evidence of impairment.  The patient's sleep has evidence of insomnia. The patient's insight shows no evidence of impairment.  The patient's judgement shows no evidence of impairment.      The patient has demonstrated mental capacity to provide informed consent.    Section IV - Substance Abuse  The patient is not using substances. . 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  The patient lives with spouse. The patient has 3 children, ages  .  The patient does plan to return home upon discharge. The patient's source of income comes from family.    The patient's greatest support comes from family  and this person will be involved with the treatment. The patient has not been in an event described as horrible or outside the realm of ordinary life experience either currently or in the past. The patient has not been a victim of sexual/physical abuse.    Section VII - Other Areas of Clinical Concern    The highest grade achieved is not assessed  with the overall quality of school experience being described as not assessed . The patient is currently unemployed and speaks Spanish as a primary language.  The patient has no communication impairments affecting  communication. The patient's preference for learning can be described as: can read and write adequately.  The patient's hearing is normal.  The patient's vision is normal.    The patient reports coping skills include:     Hart LOISE Hoyles, LCSW  "

## 2024-09-13 LAB — TSH + FREE T4 PANEL
T4 Free: 1.4 ng/dL (ref 0.9–1.6)
TSH, 3rd Generation: 7.26 u[IU]/mL — ABNORMAL HIGH (ref 0.270–4.200)

## 2024-10-16 ENCOUNTER — Emergency Department: Admit: 2024-10-17 | Payer: PRIVATE HEALTH INSURANCE

## 2024-10-16 NOTE — ED Provider Notes (Signed)
 [facility] EMERGENCY DEPARTMENT  EMERGENCY DEPARTMENT ENCOUNTER      Pt Name: Melinda [name]  MRN: [id number]  Birthdate [birth year]  Date of evaluation: 10/16/2024  Provider: Elsie Dolly, MD    CHIEF COMPLAINT       Chief Complaint   Patient presents with    Abdominal Pain         HISTORY OF PRESENT ILLNESS   (Location/Symptom, Timing/Onset, Context/Setting, Quality, Duration, Modifying Factors, Severity)  Note limiting factors.   44 year old Hispanic female with a past medical history significant for chronic abdominal pain, chronic kidney disease and hemodialysis dependent, type 2 diabetes, gastroparesis, GERD, hypercholesterolemia, hypertension presents the ER for evaluation for intractable pain with nausea, vomiting and diaphoresis for the last 12 hours.  She was discharged from the hospital 2 weeks ago for the same symptoms.  She denies any fever and chill, cough or congestion, nausea or vomiting, sick contact, skin rash or recent travel            Review of External Medical Records:     Nursing Notes were reviewed.    REVIEW OF SYSTEMS    (2-9 systems for level 4, 10 or more for level 5)     Review of Systems    Except as noted above the remainder of the review of systems was reviewed and negative.       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)     Gastroparesis     Gastroparesis     GERD (gastroesophageal reflux disease)     High cholesterol     Hypertension          SURGICAL HISTORY       Past Surgical History:   Procedure Laterality Date    CAPSULE ENDOSCOPY N/A 01/20/2023    ESOPHAGEAL CAPSULE ENDOSCOPY remove at 1624PM performed by Madlyn Fendt, MD at [facility] ENDOSCOPY    CHOLECYSTECTOMY, LAPAROSCOPIC N/A 02/03/2023    ROBOTIC LAPAROSCOPIC CHOLECYSTECTOMY with Indocyanine green  performed by Chrystal Elijah BRAVO, MD at [facility] MAIN OR    COLONOSCOPY N/A 01/19/2023    COLONOSCOPY  DIAGNOSTIC performed by Madlyn Fendt, MD at [facility] ENDOSCOPY    INVASIVE VASCULAR N/A 10/03/2023    Angiography visceral SMA performed by Millard Oneil LABOR, MD at [facility] CARDIAC CATH LAB    INVASIVE VASCULAR N/A 10/03/2023    Ultrasound guided vascular access performed by Millard Oneil LABOR, MD at [facility] CARDIAC CATH LAB    INVASIVE VASCULAR N/A 10/03/2023    Insert stent peripheral artery performed by Millard Oneil LABOR, MD at [facility] CARDIAC CATH LAB    IR NONTUNNELED VASCULAR CATHETER > 5 YEARS  10/10/2023    IR NONTUNNELED VASCULAR CATHETER > 5 YEARS 10/10/2023 [facility] CARDIAC CATH/EP/IR LAB    IR NONTUNNELED VASCULAR CATHETER > 5 YEARS  10/10/2023    IR NONTUNNELED VASCULAR CATHETER > 5 YEARS 10/10/2023 [facility] CARDIAC CATH/EP/IR LAB    OTHER SURGICAL HISTORY Left     Rentia attachment    TUBAL LIGATION Bilateral     UPPER GASTROINTESTINAL ENDOSCOPY N/A 01/17/2023    ESOPHAGOGASTRODUODENOSCOPY performed by Madlyn Fendt, MD at [facility] ENDOSCOPY    UPPER GASTROINTESTINAL ENDOSCOPY N/A 01/17/2023    ESOPHAGOGASTRODUODENOSCOPY BIOPSY performed by Madlyn Fendt, MD at [facility] ENDOSCOPY    UPPER GASTROINTESTINAL ENDOSCOPY N/A 01/18/2023    ESOPHAGOGASTRODUODENOSCOPY performed by Hoque,  Rafaz, MD at [facility] ENDOSCOPY    UPPER GASTROINTESTINAL ENDOSCOPY N/A 05/13/2023    ESOPHAGOGASTRODUODENOSCOPY performed by Roseann Lonni BIRCH, MD at [facility] ENDOSCOPY    UPPER GASTROINTESTINAL ENDOSCOPY N/A 05/13/2023    ESOPHAGOGASTRODUODENOSCOPY BIOPSY performed by Roseann Lonni BIRCH, MD at [facility] ENDOSCOPY    UPPER GASTROINTESTINAL ENDOSCOPY N/A 09/21/2023    ESOPHAGOGASTRODUODENOSCOPY performed by Madlyn Fendt, MD at [facility] ENDOSCOPY    UPPER GASTROINTESTINAL ENDOSCOPY N/A 09/21/2023    ESOPHAGOGASTRODUODENOSCOPY BIOPSY performed by Madlyn Fendt, MD at [facility] ENDOSCOPY    US  FLUID COLLECTION DRAINAGE PERITONEAL/RETROPERITONEAL [facility]  02/11/2023    US  ABSCESS DRAINAGE PERITONEAL 02/11/2023 [facility] RAD US          CURRENT MEDICATIONS       Discharge Medication List as of 10/16/2024 11:56 PM         CONTINUE these medications which have NOT CHANGED    Details   aspirin  81 MG chewable tablet 1 tablet by Per J Tube route daily, Disp-30 tablet, R-3Normal      atorvastatin  (LIPITOR ) 80 MG tablet 1 tablet by Per J Tube route nightly, Disp-30 tablet, R-3Normal      busPIRone  (BUSPAR ) 5 MG tablet 1 tablet by Per J Tube route in the morning and at bedtime, Disp-90 tablet, R-0Normal      carvedilol  (COREG ) 25 MG tablet 1 tablet by Per J Tube route 2 times daily (with meals), Disp-60 tablet, R-3Normal      ceFAZolin  (ANCEF ) 500 MG injection cefazolin  2/2/3 post HD until 09/19/24, Disp-1 each, R-0NO PRINT      clopidogrel  (PLAVIX ) 75 MG tablet 1 tablet by Per J Tube route daily, Disp-30 tablet, R-3Normal      dicyclomine  (BENTYL ) 10 MG capsule 1 capsule by Per J Tube route 3 times daily (before meals), Disp-120 capsule, R-3Normal      DULoxetine  (CYMBALTA ) 60 MG extended release capsule 1 capsule by Per G Tube route daily, Disp-30 capsule, R-3Normal      famotidine  (PEPCID ) 20 MG tablet 1 tablet by Per J Tube route daily, Disp-60 tablet, R-3Normal      hydrALAZINE  (APRESOLINE ) 50 MG tablet 1 tablet by Per J Tube route 3 times daily, Disp-90 tablet, R-3Normal      insulin  glargine (LANTUS ) 100 UNIT/ML injection vial Inject 10 Units into the skin nightly, Disp-10 mL, R-3Normal      lansoprazole  (PREVACID  SOLUTAB) 30 MG disintegrating tablet 1 tablet by Per J Tube route 2 times daily, Disp-30 tablet, R-3Normal      metoclopramide  (REGLAN ) 5 MG/5ML solution 5 mLs by Per J Tube route 3 times daily (before meals), Disp-750 mL, R-3Normal      sodium bicarbonate  650 MG tablet 1 tablet by Per J Tube route 3 times daily, Disp-90 tablet, R-0Normal             ALLERGIES     Patient has no known allergies.    FAMILY HISTORY     History reviewed. No pertinent family history.       SOCIAL HISTORY       Social History     Socioeconomic History    Marital status: Married     Spouse name: None    Number of children: None    Years of  education: None    Highest education level: None   Tobacco Use    Smoking status: Never    Smokeless tobacco: Never   Vaping Use    Vaping status: Never Used   Substance and Sexual Activity  Alcohol use: Never    Drug use: Never    Sexual activity: Defer     Social Drivers of Health     Financial Resource Strain: Low Risk  (05/02/2024)    Received from [facility]    Overall Financial Resource Strain (CARDIA)     Difficulty of Paying Living Expenses: Not hard at all   Recent Concern: Financial Resource Strain - High Risk (04/02/2024)    Received from [facility]    Overall Financial Resource Strain (CARDIA)     Difficulty of Paying Living Expenses: Hard   Food Insecurity: No Food Insecurity (08/06/2024)    Hunger Vital Sign     Worried About Running Out of Food in the Last Year: Never true     Ran Out of Food in the Last Year: Never true   Transportation Needs: No Transportation Needs (08/06/2024)    PRAPARE - Therapist, Art (Medical): No     Lack of Transportation (Non-Medical): No   Housing Stability: Low Risk  (08/06/2024)    Housing Stability Vital Sign     Unable to Pay for Housing in the Last Year: No     Number of Times Moved in the Last Year: 0     Homeless in the Last Year: No           PHYSICAL EXAM    (up to 7 for level 4, 8 or more for level 5)     ED Triage Vitals [10/16/24 2045]   BP Girls Systolic BP Percentile Girls Diastolic BP Percentile Boys Systolic BP Percentile Boys Diastolic BP Percentile Temp Temp Source Pulse   (!) 213/116 -- -- -- -- 99 F (37.2 C) Oral (!) 117      Respirations SpO2 Height Weight       20 98 % -- --           There is no height or weight on file to calculate BMI.    Physical Exam  Vitals and nursing note reviewed. Exam conducted with a chaperone present.         CONSTITUTIONAL: Well-appearing; well-nourished; in mild distress  HEAD: Normocephalic; atraumatic  EYES: PERRL; EOM intact; conjunctiva and sclera are  clear bilaterally.  ENT: No rhinorrhea; normal pharynx with no tonsillar hypertrophy; mucous membranes pink/moist, no erythema, no exudate.  NECK: Supple; non-tender; no cervical lymphadenopathy  CARD: Normal S1, S2; no murmurs, rubs, or gallops. Regular rate and rhythm.  RESP: Normal respiratory effort; breath sounds clear and equal bilaterally; no wheezes, rhonchi, or rales.  ABD: Normal bowel sounds; non-distended; diffuse tenderness without any rebound or guarding; no palpable organomegaly, no masses, no bruits.  Back Exam: Normal inspection; no vertebral point tenderness, no CVA tenderness. Normal range of motion.  EXT: Normal ROM in all four extremities; non-tender to palpation; no swelling or deformity; distal pulses are normal, no edema.  SKIN: Warm; dry; no rash.  NEURO:Alert and oriented x 3, coherent, NII-XII grossly intact, sensory and motor are non-focal.        DIAGNOSTIC RESULTS     EKG: All EKG's are interpreted by the Emergency Department Physician who either signs or Co-signs this chart in the absence of a cardiologist.        RADIOLOGY:   Non-plain film images such as CT, Ultrasound and MRI are read by the radiologist. Plain radiographic images are visualized and preliminarily interpreted by the emergency physician with the below findings:  Interpretation per the Radiologist below, if available at the time of this note:    CT ABDOMEN PELVIS WO CONTRAST Additional Contrast? None   Final Result   Small bilateral pleural effusions right greater than left with underlying   atelectasis. Pericardial effusion. Mild ascites. Subcutaneous edema. These   findings may be related to third spacing of fluids. Otherwise no acute   intra-abdominal abnormality is identified.      Electronically signed by DARICE COLON           LABS:  Labs Reviewed   CBC WITH AUTO DIFFERENTIAL - Abnormal; Notable for the following components:       Result Value    RBC 3.04 (*)     Hemoglobin 9.2 (*)     Hematocrit 29.8 (*)      RDW 16.7 (*)     Eosinophils Absolute 0.54 (*)     All other components within normal limits   COMPREHENSIVE METABOLIC PANEL - Abnormal; Notable for the following components:    Glucose 180 (*)     Creatinine 3.79 (*)     BUN/Creatinine Ratio 5 (*)     Est, Glom Filt Rate 15 (*)     Calcium  8.2 (*)     ALT 41 (*)     AST 39 (*)     Alk Phosphatase 363 (*)     Albumin  2.7 (*)     Globulin 4.3 (*)     Albumin /Globulin Ratio 0.6 (*)     All other components within normal limits   LIPASE   MAGNESIUM    APTT   PROTIME-INR   URINALYSIS WITH MICROSCOPIC       All other labs were within normal range or not returned as of this dictation.    EMERGENCY DEPARTMENT COURSE and DIFFERENTIAL DIAGNOSIS/MDM:   Vitals:    Vitals:    10/16/24 2045 10/16/24 2345   BP: (!) 213/116 (!) 175/87   Pulse: (!) 117 92   Resp: 20 16   Temp: 99 F (37.2 C)    TempSrc: Oral    SpO2: 98% 99%           Medical Decision Making  Assessment: 44 year old female with abdominal pain with stable exam and vital signs.  The patient has a fairly benign exam with stable vital signs.  She has had multiple visits to the ER for the same symptoms.  Suspect acute exacerbation of the gastroparesis, gastritis, chronic abdominal pain rule out bowel obstruction, constipation, GERD, myofascial strain, metabolic derangement including electrolyte abnormality.    Plan: Lab/antiemetic and analgesia/CT scan of the abdomen and pelvis/IV fluid/ Monitor and Reevaluate.      Amount and/or Complexity of Data Reviewed  Labs: ordered.  Radiology: ordered.    Risk  Prescription drug management.            REASSESSMENT      Progress Note:   Pt has been reexamined by Elsie Dolly, MD. Pt is feeling much better. Symptoms have improved. All available results have been reviewed with pt and any available family. Pt understands sx, dx, and tx in ED. Care plan has been outlined and questions have been answered. Pt is ready to go home. Will send home on abdominal pain instruction.  Oral  rehydration education.  Go to dialysis as previously instructed by PCP . Outpatient referral with PCP as needed. Written by Sundy Houchins, MD,10:16 AM       CONSULTS:  None    PROCEDURES:  Unless  otherwise noted below, none     Procedures      FINAL IMPRESSION      1. Chronic abdominal pain    2. End stage renal disease on dialysis (HCC)    3. Secondary hypertension    4. Gastroparesis          DISPOSITION/PLAN   DISPOSITION Decision To Discharge 10/16/2024 11:54:03 PM      PATIENT REFERRED TO:  Your PCP    Schedule an appointment as soon as possible for a visit in 3 days  If symptoms worsen, for reevaluation and further treatment as needed      DISCHARGE MEDICATIONS:  Discharge Medication List as of 10/16/2024 11:56 PM            (Please note that portions of this note were completed with a voice recognition program.  Efforts were made to edit the dictations but occasionally words are mis-transcribed.)    Elsie Dolly, MD (electronically signed)  Emergency Attending Physician / Physician Assistant / Nurse Practitioner             Dolly Elsie SAILOR, MD  10/17/24 1016

## 2024-10-16 NOTE — ED Triage Notes (Signed)
 Pt ambulated to the triage area accompanied by her daughter. Pt states my stomach and body has been swollen for a week I have dialysis I thought it would get better but starting today the stomach pain is severe. I had this before a month ago I have gastroparesis.  Pt is moaning and crying in triage .

## 2024-10-16 NOTE — Discharge Instructions (Signed)
 Please make sure to go to dialysis tomorrow as instructed and schedule

## 2024-10-17 ENCOUNTER — Inpatient Hospital Stay
Admit: 2024-10-17 | Discharge: 2024-10-17 | Disposition: A | Discharge status: Home / Self Care | Payer: PRIVATE HEALTH INSURANCE | Arrived: VH | Attending: Emergency Medicine

## 2024-10-17 LAB — CBC WITH AUTO DIFFERENTIAL
Basophils %: 0.5 % (ref 0.0–1.0)
Basophils Absolute: 0.05 K/UL (ref 0.00–0.10)
Eosinophils %: 5.5 % (ref 0.0–7.0)
Eosinophils Absolute: 0.54 K/UL — ABNORMAL HIGH (ref 0.00–0.40)
Hematocrit: 29.8 % — ABNORMAL LOW (ref 35.0–47.0)
Hemoglobin: 9.2 g/dL — ABNORMAL LOW (ref 11.5–16.0)
Immature Granulocytes %: 0.3 % (ref 0.0–0.5)
Immature Granulocytes Absolute: 0.03 K/UL (ref 0.00–0.04)
Lymphocytes %: 13.3 % (ref 12.0–49.0)
Lymphocytes Absolute: 1.31 K/UL (ref 0.80–3.50)
MCH: 30.3 pg (ref 26.0–34.0)
MCHC: 30.9 g/dL (ref 30.0–36.5)
MCV: 98 FL (ref 80.0–99.0)
MPV: 8.9 FL (ref 8.9–12.9)
Monocytes %: 9.1 % (ref 5.0–13.0)
Monocytes Absolute: 0.89 K/UL (ref 0.00–1.00)
Neutrophils %: 71.3 % (ref 32.0–75.0)
Neutrophils Absolute: 7.01 K/UL (ref 1.80–8.00)
Nucleated RBCs: 0 /100{WBCs}
Platelets: 391 K/uL (ref 150–400)
RBC: 3.04 M/uL — ABNORMAL LOW (ref 3.80–5.20)
RDW: 16.7 % — ABNORMAL HIGH (ref 11.5–14.5)
WBC: 9.8 K/uL (ref 3.6–11.0)
nRBC: 0 K/uL (ref 0.00–0.01)

## 2024-10-17 LAB — COMPREHENSIVE METABOLIC PANEL
ALT: 41 U/L — ABNORMAL HIGH (ref 10–35)
AST: 39 U/L — ABNORMAL HIGH (ref 10–35)
Albumin/Globulin Ratio: 0.6 — ABNORMAL LOW (ref 1.1–2.2)
Albumin: 2.7 g/dL — ABNORMAL LOW (ref 3.5–5.2)
Alk Phosphatase: 363 U/L — ABNORMAL HIGH (ref 35–104)
Anion Gap: 11 mmol/L (ref 2–14)
BUN/Creatinine Ratio: 5 — ABNORMAL LOW (ref 12–20)
BUN: 20 mg/dL (ref 6–20)
CO2: 25 mmol/L (ref 20–29)
Calcium: 8.2 mg/dL — ABNORMAL LOW (ref 8.6–10.0)
Chloride: 102 mmol/L (ref 98–107)
Creatinine: 3.79 mg/dL — ABNORMAL HIGH (ref 0.60–1.00)
Est, Glom Filt Rate: 15 ml/min/1.73m2 — ABNORMAL LOW (ref >59)
Globulin: 4.3 g/dL — ABNORMAL HIGH (ref 2.0–4.0)
Glucose: 180 mg/dL — ABNORMAL HIGH (ref 65–100)
Potassium: 4.5 mmol/L (ref 3.5–5.1)
Sodium: 138 mmol/L (ref 136–145)
Total Bilirubin: 0.3 mg/dL (ref 0.0–1.2)
Total Protein: 6.9 g/dL (ref 6.4–8.3)

## 2024-10-17 LAB — PROTIME-INR
INR: 1.1 (ref 0.9–1.1)
Protime: 11.2 s (ref 9.2–11.2)

## 2024-10-17 LAB — MAGNESIUM: Magnesium: 1.8 mg/dL (ref 1.6–2.6)

## 2024-10-17 LAB — APTT: APTT: 27.3 s (ref 22.1–31.0)

## 2024-10-17 LAB — LIPASE: Lipase: 26 U/L (ref 13–60)

## 2024-10-17 MED ORDER — KETOROLAC TROMETHAMINE 30 MG/ML IJ SOLN
30 | INTRAMUSCULAR | Status: AC
Start: 2024-10-17 — End: 2024-10-16
  Administered 2024-10-17: 03:00:00 30 mg via INTRAVENOUS

## 2024-10-17 MED ORDER — PROCHLORPERAZINE EDISYLATE 10 MG/2ML IJ SOLN
10 | Freq: Once | INTRAMUSCULAR | Status: AC
Start: 2024-10-17 — End: 2024-10-16
  Administered 2024-10-17: 03:00:00 10 mg via INTRAVENOUS

## 2024-10-17 MED ORDER — DIPHENHYDRAMINE HCL 50 MG/ML IJ SOLN
50 | INTRAMUSCULAR | Status: AC
Start: 2024-10-17 — End: 2024-10-16
  Administered 2024-10-17: 02:00:00 50 mg via INTRAVENOUS

## 2024-10-17 MED FILL — DIPHENHYDRAMINE HCL 50 MG/ML IJ SOLN: 50 mg/mL | INTRAMUSCULAR | Qty: 1 | Fill #0

## 2024-10-17 MED FILL — KETOROLAC TROMETHAMINE 30 MG/ML IJ SOLN: 30 mg/mL | INTRAMUSCULAR | Qty: 1 | Fill #0

## 2024-10-17 MED FILL — PROCHLORPERAZINE EDISYLATE 10 MG/2ML IJ SOLN: 10 MG/2ML | INTRAMUSCULAR | Qty: 2 | Fill #0
